# Patient Record
Sex: Male | Born: 1960 | Race: White | Hispanic: No | Marital: Single | Smoking: Never smoker
Health system: Southern US, Community
[De-identification: ages and names within clinical notes are randomized; demographics above are authoritative.]

## PROBLEM LIST (undated history)

## (undated) DIAGNOSIS — S069XAA Unspecified intracranial injury with loss of consciousness status unknown, initial encounter: Secondary | ICD-10-CM

## (undated) DIAGNOSIS — F209 Schizophrenia, unspecified: Secondary | ICD-10-CM

## (undated) DIAGNOSIS — I639 Cerebral infarction, unspecified: Secondary | ICD-10-CM

## (undated) DIAGNOSIS — I219 Acute myocardial infarction, unspecified: Secondary | ICD-10-CM

## (undated) HISTORY — PX: NO PAST SURGERIES: SHX2092

---

## 2021-07-20 ENCOUNTER — Inpatient Hospital Stay
Admission: EM | Admit: 2021-07-20 | Discharge: 2022-03-19 | DRG: 280 | Disposition: A | Discharge status: Home / Self Care | Payer: Medicare HMO | Attending: Internal Medicine | Admitting: Internal Medicine

## 2021-07-20 ENCOUNTER — Other Ambulatory Visit: Payer: Self-pay

## 2021-07-20 DIAGNOSIS — R3915 Urgency of urination: Secondary | ICD-10-CM | POA: Diagnosis present

## 2021-07-20 DIAGNOSIS — G9389 Other specified disorders of brain: Secondary | ICD-10-CM | POA: Diagnosis present

## 2021-07-20 DIAGNOSIS — S069XAS Unspecified intracranial injury with loss of consciousness status unknown, sequela: Secondary | ICD-10-CM

## 2021-07-20 DIAGNOSIS — F209 Schizophrenia, unspecified: Secondary | ICD-10-CM | POA: Diagnosis not present

## 2021-07-20 DIAGNOSIS — R4689 Other symptoms and signs involving appearance and behavior: Principal | ICD-10-CM

## 2021-07-20 DIAGNOSIS — R509 Fever, unspecified: Secondary | ICD-10-CM | POA: Diagnosis not present

## 2021-07-20 DIAGNOSIS — F09 Unspecified mental disorder due to known physiological condition: Secondary | ICD-10-CM

## 2021-07-20 DIAGNOSIS — J690 Pneumonitis due to inhalation of food and vomit: Secondary | ICD-10-CM | POA: Diagnosis present

## 2021-07-20 DIAGNOSIS — I69320 Aphasia following cerebral infarction: Secondary | ICD-10-CM

## 2021-07-20 DIAGNOSIS — W19XXXA Unspecified fall, initial encounter: Secondary | ICD-10-CM

## 2021-07-20 DIAGNOSIS — F259 Schizoaffective disorder, unspecified: Secondary | ICD-10-CM | POA: Diagnosis present

## 2021-07-20 DIAGNOSIS — E860 Dehydration: Secondary | ICD-10-CM | POA: Diagnosis present

## 2021-07-20 DIAGNOSIS — I214 Non-ST elevation (NSTEMI) myocardial infarction: Principal | ICD-10-CM | POA: Diagnosis present

## 2021-07-20 DIAGNOSIS — Z66 Do not resuscitate: Secondary | ICD-10-CM | POA: Diagnosis present

## 2021-07-20 DIAGNOSIS — Z20822 Contact with and (suspected) exposure to covid-19: Secondary | ICD-10-CM | POA: Diagnosis present

## 2021-07-20 DIAGNOSIS — S069XAA Unspecified intracranial injury with loss of consciousness status unknown, initial encounter: Secondary | ICD-10-CM | POA: Diagnosis present

## 2021-07-20 DIAGNOSIS — K5900 Constipation, unspecified: Secondary | ICD-10-CM | POA: Diagnosis present

## 2021-07-20 DIAGNOSIS — Z91199 Patient's noncompliance with other medical treatment and regimen due to unspecified reason: Secondary | ICD-10-CM

## 2021-07-20 DIAGNOSIS — I639 Cerebral infarction, unspecified: Secondary | ICD-10-CM

## 2021-07-20 DIAGNOSIS — Z87828 Personal history of other (healed) physical injury and trauma: Secondary | ICD-10-CM

## 2021-07-20 DIAGNOSIS — Z87820 Personal history of traumatic brain injury: Secondary | ICD-10-CM

## 2021-07-20 DIAGNOSIS — E512 Wernicke's encephalopathy: Secondary | ICD-10-CM | POA: Diagnosis present

## 2021-07-20 DIAGNOSIS — I5043 Acute on chronic combined systolic (congestive) and diastolic (congestive) heart failure: Secondary | ICD-10-CM | POA: Diagnosis present

## 2021-07-20 DIAGNOSIS — N390 Urinary tract infection, site not specified: Secondary | ICD-10-CM | POA: Diagnosis present

## 2021-07-20 DIAGNOSIS — N50819 Testicular pain, unspecified: Secondary | ICD-10-CM

## 2021-07-20 DIAGNOSIS — R32 Unspecified urinary incontinence: Secondary | ICD-10-CM | POA: Diagnosis present

## 2021-07-20 DIAGNOSIS — I2510 Atherosclerotic heart disease of native coronary artery without angina pectoris: Secondary | ICD-10-CM | POA: Diagnosis present

## 2021-07-20 DIAGNOSIS — Z885 Allergy status to narcotic agent status: Secondary | ICD-10-CM

## 2021-07-20 DIAGNOSIS — Z751 Person awaiting admission to adequate facility elsewhere: Secondary | ICD-10-CM

## 2021-07-20 DIAGNOSIS — G9341 Metabolic encephalopathy: Secondary | ICD-10-CM | POA: Diagnosis present

## 2021-07-20 DIAGNOSIS — R748 Abnormal levels of other serum enzymes: Secondary | ICD-10-CM | POA: Clinically undetermined

## 2021-07-20 DIAGNOSIS — I959 Hypotension, unspecified: Secondary | ICD-10-CM | POA: Diagnosis present

## 2021-07-20 DIAGNOSIS — I219 Acute myocardial infarction, unspecified: Secondary | ICD-10-CM

## 2021-07-20 DIAGNOSIS — Z79899 Other long term (current) drug therapy: Secondary | ICD-10-CM

## 2021-07-20 DIAGNOSIS — Z886 Allergy status to analgesic agent status: Secondary | ICD-10-CM

## 2021-07-20 DIAGNOSIS — Z91148 Patient's other noncompliance with medication regimen for other reason: Secondary | ICD-10-CM

## 2021-07-20 DIAGNOSIS — J810 Acute pulmonary edema: Secondary | ICD-10-CM

## 2021-07-20 HISTORY — DX: Acute myocardial infarction, unspecified: I21.9

## 2021-07-20 HISTORY — DX: Schizophrenia, unspecified: F20.9

## 2021-07-20 HISTORY — DX: Unspecified intracranial injury with loss of consciousness status unknown, initial encounter: S06.9XAA

## 2021-07-20 HISTORY — DX: Cerebral infarction, unspecified: I63.9

## 2021-07-20 LAB — COMPREHENSIVE METABOLIC PANEL
ALT: 16 U/L (ref 0–44)
AST: 16 U/L (ref 15–41)
Albumin: 4.3 g/dL (ref 3.5–5.0)
Alkaline Phosphatase: 92 U/L (ref 38–126)
Anion gap: 10 (ref 5–15)
BUN: 16 mg/dL (ref 8–23)
CO2: 27 mmol/L (ref 22–32)
Calcium: 9.3 mg/dL (ref 8.9–10.3)
Chloride: 102 mmol/L (ref 98–111)
Creatinine, Ser: 1.01 mg/dL (ref 0.61–1.24)
GFR, Estimated: >60 mL/min (ref >60)
Glucose, Bld: 96 mg/dL (ref 70–99)
Potassium: 3.7 mmol/L (ref 3.5–5.1)
Sodium: 139 mmol/L (ref 135–145)
Total Bilirubin: 0.9 mg/dL (ref 0.3–1.2)
Total Protein: 7.3 g/dL (ref 6.5–8.1)

## 2021-07-20 LAB — URINE DRUG SCREEN, QUALITATIVE (ARMC ONLY)
Amphetamines, Ur Screen: NOT DETECTED
Barbiturates, Ur Screen: NOT DETECTED
Benzodiazepine, Ur Scrn: POSITIVE — AB
Cannabinoid 50 Ng, Ur ~~LOC~~: NOT DETECTED
Cocaine Metabolite,Ur ~~LOC~~: NOT DETECTED
MDMA (Ecstasy)Ur Screen: NOT DETECTED
Methadone Scn, Ur: NOT DETECTED
Opiate, Ur Screen: NOT DETECTED
Phencyclidine (PCP) Ur S: NOT DETECTED
Tricyclic, Ur Screen: NOT DETECTED

## 2021-07-20 LAB — RESP PANEL BY RT-PCR (FLU A&B, COVID) ARPGX2
Influenza A by PCR: NEGATIVE
Influenza B by PCR: NEGATIVE
SARS Coronavirus 2 by RT PCR: NEGATIVE

## 2021-07-20 LAB — CBC WITH DIFFERENTIAL/PLATELET
Abs Immature Granulocytes: 0.01 10*3/uL (ref 0.00–0.07)
Basophils Absolute: 0 10*3/uL (ref 0.0–0.1)
Basophils Relative: 1 %
Eosinophils Absolute: 0.1 10*3/uL (ref 0.0–0.5)
Eosinophils Relative: 3 %
HCT: 40.6 % (ref 39.0–52.0)
Hemoglobin: 15 g/dL (ref 13.0–17.0)
Immature Granulocytes: 0 %
Lymphocytes Relative: 34 %
Lymphs Abs: 1.8 10*3/uL (ref 0.7–4.0)
MCH: 33.3 pg (ref 26.0–34.0)
MCHC: 36.9 g/dL — ABNORMAL HIGH (ref 30.0–36.0)
MCV: 90.2 fL (ref 80.0–100.0)
Monocytes Absolute: 0.3 10*3/uL (ref 0.1–1.0)
Monocytes Relative: 6 %
Neutro Abs: 2.9 10*3/uL (ref 1.7–7.7)
Neutrophils Relative %: 56 %
Platelets: 166 10*3/uL (ref 150–400)
RBC: 4.5 MIL/uL (ref 4.22–5.81)
RDW: 12.4 % (ref 11.5–15.5)
WBC: 5.1 10*3/uL (ref 4.0–10.5)
nRBC: 0 % (ref 0.0–0.2)

## 2021-07-20 LAB — ETHANOL: Alcohol, Ethyl (B): <10 mg/dL (ref <10)

## 2021-07-20 LAB — ACETAMINOPHEN LEVEL: Acetaminophen (Tylenol), Serum: <10 ug/mL — ABNORMAL LOW (ref 10–30)

## 2021-07-20 LAB — SALICYLATE LEVEL: Salicylate Lvl: <7 mg/dL — ABNORMAL LOW (ref 7.0–30.0)

## 2021-07-20 MED ORDER — DIPHENHYDRAMINE HCL 50 MG/ML IJ SOLN: 25.0000 mg | Freq: Once | INTRAMUSCULAR | Status: AC

## 2021-07-20 MED ORDER — LORAZEPAM 2 MG/ML IJ SOLN: 2.0000 mg | Freq: Once | INTRAMUSCULAR | Status: AC

## 2021-07-20 MED ORDER — DIPHENHYDRAMINE HCL 50 MG/ML IJ SOLN
INTRAMUSCULAR | Status: AC
  Administered 2021-07-20: 25 mg via INTRAMUSCULAR
  Filled 2021-07-20: qty 1

## 2021-07-20 MED ORDER — HALOPERIDOL LACTATE 5 MG/ML IJ SOLN
INTRAMUSCULAR | Status: AC
  Administered 2021-07-20: 5 mg via INTRAMUSCULAR
  Filled 2021-07-20: qty 1

## 2021-07-20 MED ORDER — HALOPERIDOL LACTATE 5 MG/ML IJ SOLN: 5.0000 mg | Freq: Once | INTRAMUSCULAR | Status: AC

## 2021-07-20 MED ORDER — ZIPRASIDONE MESYLATE 20 MG IM SOLR
10.0000 mg | Freq: Once | INTRAMUSCULAR | Status: AC
  Administered 2021-07-20: 10 mg via INTRAMUSCULAR
  Filled 2021-07-20: qty 20

## 2021-07-20 MED ORDER — LORAZEPAM 2 MG/ML IJ SOLN
INTRAMUSCULAR | Status: AC
  Administered 2021-07-20: 2 mg via INTRAMUSCULAR
  Filled 2021-07-20: qty 1

## 2021-07-20 NOTE — ED Notes (Signed)
Dinner tray given. No other needs found.  

## 2021-07-20 NOTE — ED Notes (Signed)
Patient screaming out and singing in room. EDP oked Clinical research associate to give 10mg  Geodon that was ordered earlier and was not given.

## 2021-07-20 NOTE — ED Notes (Addendum)
Pt attempted to walk out of ED- security stopped pt and escorted him back to room with [name] TTS- pt continued yelling and trying to leave- security called for back- security remains at doorAsher Muir, NP aware of situation and gave VO for 10 Geodon after 1 hour if pt still agitated- see MAR for order

## 2021-07-20 NOTE — ED Notes (Signed)
Pt still refusing to dress out

## 2021-07-20 NOTE — ED Notes (Signed)
Pt refusing to give name or allow any staff to touch him- pt yelling loudly- Dr Katrinka Blazing went to see pt- see orders

## 2021-07-20 NOTE — ED Notes (Signed)
IVC, pending MD examination
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
## 2021-07-20 NOTE — ED Notes (Signed)
Pt to doorway holding out ID band- asked pt if he would like me to put it back on his wrist and he agreed- taped armband back on and pt smiled and went back to room

## 2021-07-20 NOTE — ED Notes (Signed)

## 2021-07-20 NOTE — ED Triage Notes (Addendum)
Pt brought in by [employer].  Pt yelling out and uncooperative.  Pt is from a group home and is not taking medications, aggressive behavior.   Pt alert.  Hx schizophrenia  Unable to take vital signs in triage.
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
## 2021-07-21 DIAGNOSIS — F209 Schizophrenia, unspecified: Secondary | ICD-10-CM | POA: Diagnosis not present

## 2021-07-21 MED ORDER — HALOPERIDOL 5 MG PO TABS
5.0000 mg | ORAL_TABLET | Freq: Three times a day (TID) | ORAL | Status: DC
  Administered 2021-07-21: 5 mg via ORAL
  Filled 2021-07-21: qty 1

## 2021-07-21 MED ORDER — LORAZEPAM 2 MG/ML IJ SOLN: 2.0000 mg | Freq: Once | INTRAMUSCULAR | Status: AC

## 2021-07-21 MED ORDER — BENZTROPINE MESYLATE 1 MG PO TABS: 1.0000 mg | ORAL_TABLET | Freq: Every day | ORAL | Status: DC

## 2021-07-21 MED ORDER — DIPHENHYDRAMINE HCL 50 MG/ML IJ SOLN
INTRAMUSCULAR | Status: AC
  Administered 2021-07-21: 50 mg via INTRAMUSCULAR
  Filled 2021-07-21: qty 1

## 2021-07-21 MED ORDER — FAMOTIDINE 20 MG PO TABS
20.0000 mg | ORAL_TABLET | Freq: Two times a day (BID) | ORAL | Status: DC | PRN
  Administered 2021-08-01 – 2021-11-15 (×10): 20 mg via ORAL
  Filled 2021-07-21 (×12): qty 1

## 2021-07-21 MED ORDER — HALOPERIDOL LACTATE 5 MG/ML IJ SOLN
5.0000 mg | Freq: Once | INTRAMUSCULAR | Status: AC
  Administered 2021-07-21: 5 mg via INTRAMUSCULAR
  Filled 2021-07-21: qty 1

## 2021-07-21 MED ORDER — LORAZEPAM 2 MG/ML IJ SOLN
2.0000 mg | INTRAMUSCULAR | Status: AC
  Administered 2021-07-21: 2 mg via INTRAMUSCULAR
  Filled 2021-07-21: qty 1

## 2021-07-21 MED ORDER — DIPHENHYDRAMINE HCL 50 MG/ML IJ SOLN
50.0000 mg | Freq: Once | INTRAMUSCULAR | Status: AC
  Administered 2021-07-21: 50 mg via INTRAMUSCULAR

## 2021-07-21 MED ORDER — FOLIC ACID 1 MG PO TABS
1.0000 mg | ORAL_TABLET | Freq: Every day | ORAL | Status: DC
  Administered 2021-07-23 – 2021-11-19 (×108): 1 mg via ORAL
  Filled 2021-07-21 (×117): qty 1

## 2021-07-21 MED ORDER — THIAMINE HCL 100 MG PO TABS
100.0000 mg | ORAL_TABLET | Freq: Every day | ORAL | Status: DC
  Administered 2021-07-23 – 2021-11-25 (×118): 100 mg via ORAL
  Filled 2021-07-21 (×124): qty 1

## 2021-07-21 MED ORDER — LORAZEPAM 1 MG PO TABS
1.0000 mg | ORAL_TABLET | Freq: Three times a day (TID) | ORAL | Status: DC
  Administered 2021-07-21 – 2021-11-26 (×342): 1 mg via ORAL
  Filled 2021-07-21 (×364): qty 1

## 2021-07-21 MED ORDER — LORAZEPAM 2 MG/ML IJ SOLN
INTRAMUSCULAR | Status: AC
  Administered 2021-07-21: 2 mg via INTRAMUSCULAR
  Filled 2021-07-21: qty 1

## 2021-07-21 MED ORDER — HALOPERIDOL LACTATE 5 MG/ML IJ SOLN: 5.0000 mg | Freq: Once | INTRAMUSCULAR | Status: AC

## 2021-07-21 MED ORDER — TRAZODONE HCL 50 MG PO TABS
150.0000 mg | ORAL_TABLET | Freq: Every day | ORAL | Status: DC
  Administered 2021-07-23 – 2021-07-24 (×2): 150 mg via ORAL
  Filled 2021-07-21 (×3): qty 3

## 2021-07-21 MED ORDER — DIPHENHYDRAMINE HCL 50 MG/ML IJ SOLN: 50.0000 mg | Freq: Once | INTRAMUSCULAR | Status: AC

## 2021-07-21 MED ORDER — ADULT MULTIVITAMIN W/MINERALS CH
1.0000 | ORAL_TABLET | Freq: Every day | ORAL | Status: DC
  Administered 2021-07-23 – 2022-03-18 (×213): 1 via ORAL
  Filled 2021-07-21 (×231): qty 1

## 2021-07-21 MED ORDER — ZIPRASIDONE MESYLATE 20 MG IM SOLR
10.0000 mg | Freq: Once | INTRAMUSCULAR | Status: AC
  Administered 2021-07-21: 10 mg via INTRAMUSCULAR
  Filled 2021-07-21: qty 20

## 2021-07-21 MED ORDER — VITAMIN D 25 MCG (1000 UNIT) PO TABS
1000.0000 [IU] | ORAL_TABLET | Freq: Every day | ORAL | Status: DC
  Administered 2021-07-23 – 2021-11-20 (×111): 1000 [IU] via ORAL
  Filled 2021-07-21 (×120): qty 1

## 2021-07-21 MED ORDER — HALOPERIDOL LACTATE 5 MG/ML IJ SOLN
INTRAMUSCULAR | Status: AC
  Administered 2021-07-21: 5 mg via INTRAMUSCULAR
  Filled 2021-07-21: qty 1

## 2021-07-21 MED ORDER — DIPHENHYDRAMINE HCL 50 MG/ML IJ SOLN
50.0000 mg | Freq: Once | INTRAMUSCULAR | Status: DC
  Filled 2021-07-21: qty 1

## 2021-07-21 MED ORDER — GABAPENTIN 300 MG PO CAPS
300.0000 mg | ORAL_CAPSULE | Freq: Three times a day (TID) | ORAL | Status: DC
  Administered 2021-07-21 – 2021-08-26 (×92): 300 mg via ORAL
  Filled 2021-07-21 (×99): qty 1

## 2021-07-21 NOTE — ED Notes (Signed)
Pt attempted to leave from area when rn entered with medications. Pt assisted back to area with security and this rn physically moving pt back to room. Pt took medications, but then spit trazodone back into cup with water. Pt refuses trazodone.

## 2021-07-21 NOTE — ED Notes (Signed)
Patient was observed beating on the light switch and beating on the door frame. Security turned off his light and patient was content.

## 2021-07-21 NOTE — ED Notes (Signed)
Oriented patient to unit, pt refused vital signs, refused medications. Pt yelling "no, no, no, no".

## 2021-07-21 NOTE — BH Assessment (Signed)
Referral information for Geropsych faxed to:  [facility] ([phone]- [phone]),   [facility] [phone]) [phone]  Earlene Plater [phone]),  [place] [phone]),   [place] [phone] or [phone]),   [facility] [phone])

## 2021-07-21 NOTE — ED Notes (Signed)
Pt ambulatory around area of bhu 6-8. Pt attempting to exit doors in area by pushing on doors and safety glass without success. Pt then intermittently ambulatory back to room and to lie on pt's bed.
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
## 2021-07-21 NOTE — ED Notes (Addendum)
Patient continuously yelling and hitting tv in room, unable to be redirected verbally. IM medications administered as ordered. Security held patient for approximately 15 seconds to prevent patient from flinching during administration and to protect staff. Patient immediately let go. Patients tv turned on per request. Security remains at bedside at this time. Pt ambulatory with full range of motion after IM administration.

## 2021-07-21 NOTE — ED Notes (Addendum)
Pt refused dinner tray. Banging on [facility] door, yelling, pacing. Pt walks toward exit door of the area; verbally stopped by security. Pt continues to yell and bang on doors. Psych NP notified.

## 2021-07-21 NOTE — ED Notes (Signed)
Pt does not answer questions reliably. Asked pt if he knew where he was and he answered "yes" but then could not elaborate on name of place, pt then repeats questions RN is asking back to her without answering. Pt will not answer suicidal questions. Pt accepts meal tray and po fluids. Pt eating at this time.

## 2021-07-21 NOTE — ED Notes (Signed)
Patient becoming increasingly agitated coming into hall, yelling '[place]" and hitting walls. Unable to be verbally redirected at this time. MD [place] aware.

## 2021-07-21 NOTE — ED Provider Notes (Signed)
Emergency Medicine Observation Re-evaluation Note  Adam [name] is a 60 y.o. male, seen on rounds today.  Pt initially presented to the ED for complaints of Psychiatric Evaluation Currently, the patient is resting, voices no medical complaints.  Physical Exam  BP (!) 132/94 (BP Location: Right Arm)   Pulse 88   Temp 98.3 F (36.8 C) (Oral)   Resp 18   Ht 5\' 8"  (1.727 m)   Wt 79.4 kg   SpO2 98%   BMI 26.61 kg/m  Physical Exam General: Resting in no acute distress Cardiac: No cyanosis Lungs: Equal rise and fall Psych: Not agitated  ED Course / MDM  EKG:   I have reviewed the labs performed to date as well as medications administered while in observation.  Recent changes in the last 24 hours include no events overnight.  Plan  Current plan is for psychiatric disposition. Adam [name] is under involuntary commitment.      [name], MD 07/21/21 [phone]
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
## 2021-07-21 NOTE — ED Notes (Signed)
Report from ally, rn.  

## 2021-07-21 NOTE — ED Notes (Signed)
IVC pending inpatient 

## 2021-07-21 NOTE — ED Notes (Signed)
Patient refused vsigns 

## 2021-07-21 NOTE — ED Notes (Signed)
Pt yelling repeatedly about social security check. Pt redirected to lower voice. Pt informed he will need to take his medications tonight. Pt initially said "no" but then was offered choice of medications to calm him down by mouth or shot. Pt chose PO medications. Pt offered nightime snack.
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
## 2021-07-21 NOTE — ED Notes (Signed)
Pt in restroom, this rn and security in to check on pt. Pt sitting on floor picking at toenails. resps unlabored.

## 2021-07-21 NOTE — BH Assessment (Signed)
Writer attempted to contact IVC petitioner Delon Manson Passey of [employer] [phone] however there was no answer/ability to leave voicemail. Follow up needed.

## 2021-07-21 NOTE — ED Notes (Signed)
Patient became agitated, came out in the hallway and refused to go back. [employer] had to walked him back to the room which he resisted. He is staying in the room right now. Dr. [name] is aware.

## 2021-07-21 NOTE — ED Notes (Signed)
Pt banging on door, pacing around.
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
## 2021-07-21 NOTE — ED Notes (Signed)
Patient dressed out by this Clinical research associate and ED Theatre stage manager.

## 2021-07-21 NOTE — ED Notes (Signed)
Pt back into bathroom.

## 2021-07-21 NOTE — ED Notes (Signed)
Pt continues to refuse VS at this time.

## 2021-07-21 NOTE — BH Assessment (Addendum)
Comprehensive Clinical Assessment (CCA) Note  07/21/2021 Adam [name] [id number] Recommendations for Services/Supports/Treatments: Psych NP Madaline Brilliant. determined pt. meets psychiatric inpatient criteria. Facilities will be contacted for placement.    Pt lying in bed, asleep upon this writer's arrival. Pt had fair eye contact and made a point to sit up in bed for the assessment.  Pt was noted to be have garbled speech and would repeat certain words throughout the interview. Motor behavior was restless. Pt was not oriented and appeared disorganized. Pt had an unremarkable appearance. Pt's mood was anxious and his affect was inappropriate. Pt has a known history of schizophrenia and lives in the group home according to the notes. Pt was confused and unaware of the context of the situation, refusing to answer many questions. Pt was able to deny SI.   Collateral: Writer attempted to contact pt's group home however there is no contact information in the pt's chart. TTS to follow up.   Chief Complaint:  Chief Complaint  Patient presents with   Psychiatric Evaluation   Visit Diagnosis: Schizophrenia    CCA Screening, Triage and Referral (STR)  Patient Reported Information How did you hear about [place]? Legal System  Referral name: No data recorded Referral phone number: No data recorded  Whom do you see for routine medical problems? No data recorded Practice/Facility Name: No data recorded Practice/Facility Phone Number: No data recorded Name of Contact: No data recorded Contact Number: No data recorded Contact Fax Number: No data recorded Prescriber Name: No data recorded Prescriber Address (if known): No data recorded  What Is the Reason for Your Visit/Call Today? Increased aggression; psych eval  How Long Has This Been Causing You Problems? <Week  What Do You Feel Would Help You the Most Today? -- (Pt unable to identify needs.)   Have You Recently Been in Any Inpatient Treatment  (Hospital/Detox/Crisis Center/28-Day Program)? No data recorded Name/Location of Program/Hospital:No data recorded How Long Were You There? No data recorded When Were You Discharged? No data recorded  Have You Ever Received Services From [facility] Before? No data recorded Who Do You See at [facility]? No data recorded  Have You Recently Had Any Thoughts About Hurting Yourself? No  Are You Planning to Commit Suicide/Harm Yourself At This time? No   Have you Recently Had Thoughts About Hurting Someone Adam [name]? Yes  Explanation: No data recorded  Have You Used Any Alcohol or Drugs in the Past 24 Hours? No  How Long Ago Did You Use Drugs or Alcohol? No data recorded What Did You Use and How Much? No data recorded  Do You Currently Have a Therapist/Psychiatrist? No  Name of Therapist/Psychiatrist: No data recorded  Have You Been Recently Discharged From Any Office Practice or Programs? No  Explanation of Discharge From Practice/Program: No data recorded    CCA Screening Triage Referral Assessment Type of Contact: Face-to-Face  Is this Initial or Reassessment? No data recorded Date Telepsych consult ordered in [facility]:  No data recorded Time Telepsych consult ordered in [facility]:  No data recorded  Patient Reported Information Reviewed? No data recorded Patient Left Without Being Seen? No data recorded Reason for Not Completing Assessment: No data recorded  Collateral Involvement: None reported   Does Patient Have a Court Appointed Legal Guardian? No data recorded Name and Contact of Legal Guardian: No data recorded If Minor and Not Living with Parent(s), Who has Custody? No data recorded Is CPS involved or ever been involved? Never  Is APS involved or  ever been involved? Never   Patient Determined To Be At Risk for Harm To Self or Others Based on Review of Patient Reported Information or Presenting Complaint? Yes, for Harm to Others  Method: No Plan  Availability of Means:  No access or NA  Intent: Vague intent or NA  Notification Required: No need or identified person  Additional Information for Danger to Others Potential: No data recorded Additional Comments for Danger to Others Potential: No data recorded Are There Guns or Other Weapons in Your Home? No  Types of Guns/Weapons: No data recorded Are These Weapons Safely Secured?                            No data recorded Who Could Verify You Are Able To Have These Secured: No data recorded Do You Have any Outstanding Charges, Pending Court Dates, Parole/Probation? No  Contacted To Inform of Risk of Harm To Self or Others: No data recorded  Location of Assessment: [facility] ED   Does Patient Present under Involuntary Commitment? Yes  IVC Papers Initial File Date: 07/20/21   [place] of Residence: [place]   Patient Currently Receiving the Following Services: Group Home; Medication Management   Determination of Need: Emergent (2 hours)   Options For Referral: Inpatient Hospitalization     CCA Biopsychosocial Intake/Chief Complaint:  No data recorded Current Symptoms/Problems: No data recorded  Patient Reported Schizophrenia/Schizoaffective Diagnosis in Past: Yes   Strengths: No data recorded Preferences: No data recorded Abilities: No data recorded  Type of Services Patient Feels are Needed: No data recorded  Initial Clinical Notes/Concerns: No data recorded  Mental Health Symptoms Depression:  No data recorded  Duration of Depressive symptoms: No data recorded  Mania:  No data recorded  Anxiety:   No data recorded  Psychosis:  No data recorded  Duration of Psychotic symptoms:  Greater than six months   Trauma:   N/A   Obsessions:   N/A   Compulsions:   N/A   Inattention:   N/A   Hyperactivity/Impulsivity:   None   Oppositional/Defiant Behaviors:   None   Emotional Irregularity:   Mood lability   Other Mood/Personality Symptoms:  No data recorded   Mental  Status Exam Appearance and self-care  Stature:   Average   Weight:   Average weight   Clothing:   Casual   Grooming:   Normal   Cosmetic use:   None   Posture/gait:   Normal   Motor activity:   Not Remarkable   Sensorium  Attention:   Unaware   Concentration:   Focuses on irrelevancies   Orientation:   -- (Pt not oriented)   Recall/memory:   -- (UTA)   Affect and Mood  Affect:   Inappropriate   Mood:   Other (Comment) (UTA)   Relating  Eye contact:   Fleeting   Facial expression:   Constricted   Attitude toward examiner:   Silly   Thought and Language  Speech flow:  Garbled   Thought content:   Appropriate to Mood and Circumstances   Preoccupation:   None   Hallucinations:   Other (Comment) (UTA)   Organization:  No data recorded  [employer] of Knowledge:   Fair   Intelligence:   Needs investigation   Abstraction:   Concrete   Judgement:   Poor   Reality Testing:   Unaware   Insight:   Poor   Decision Making:  Confused   Social Functioning  Social Maturity:   Impulsive   Social Judgement:   Heedless   Stress  Stressors:   Illness   Coping Ability:   Overwhelmed   Skill Deficits:   Communication; Interpersonal; Self-control   Supports:   Friends/Service system     Religion: Religion/Spirituality Are You A Religious Person?:  (UTA) How Might This Affect Treatment?: N/A  Leisure/Recreation: Leisure / Recreation Do You Have Hobbies?:  (UTA)  Exercise/Diet: Exercise/Diet Do You Exercise?:  (UTA) Have You Gained or Lost A Significant Amount of Weight in the Past Six Months?:  (UTA) Do You Follow a Special Diet?:  (UTA) Do You Have Any Trouble Sleeping?:  (UTA)   CCA Employment/Education Employment/Work Situation: Employment / Work Situation Employment Situation: On disability Why is Patient on Disability: Mental health dx of schizophrenia How Long has Patient Been on  Disability:  (UTA) Patient's Job has Been Impacted by Current Illness: No Has Patient ever Been in the [employer]?: No  Education: Education Is Patient Currently Attending School?: No   CCA Family/Childhood History Family and Relationship History: Family history Marital status: Single Does patient have children?: No  Childhood History:  Childhood History By whom was/is the patient raised?:  (UTA) Did patient suffer any verbal/emotional/physical/sexual abuse as a child?:  (UTA) Did patient suffer from severe childhood neglect?:  (UTA) Has patient ever been sexually abused/assaulted/raped as an adolescent or adult?:  (UTA) Was the patient ever a victim of a crime or a disaster?:  (UTA) Witnessed domestic violence?:  (UTA) Has patient been affected by domestic violence as an adult?:  Industrial/product designer)  Child/Adolescent Assessment:     CCA Substance Use Alcohol/Drug Use: Alcohol / Drug Use Pain Medications: See PTA Prescriptions: See PTA Over the Counter: See PTA History of alcohol / drug use?: No history of alcohol / drug abuse                         ASAM's:  Six Dimensions of Multidimensional Assessment  Dimension 1:  Acute Intoxication and/or Withdrawal Potential:      Dimension 2:  Biomedical Conditions and Complications:      Dimension 3:  Emotional, Behavioral, or Cognitive Conditions and Complications:     Dimension 4:  Readiness to Change:     Dimension 5:  Relapse, Continued use, or Continued Problem Potential:     Dimension 6:  Recovery/Living Environment:     ASAM Severity Score:    ASAM Recommended Level of Treatment:     Substance use Disorder (SUD)    Recommendations for Services/Supports/Treatments:    DSM5 Diagnoses: Patient Active Problem List   Diagnosis Date Noted   Schizophrenia (HCC) 07/20/2021   Ching Rabideau [employer], LCAS

## 2021-07-21 NOTE — ED Notes (Signed)
Refused dinner and vital signs.

## 2021-07-21 NOTE — ED Notes (Signed)
Report given to [facility]. Several Tax adviser are in the [place].

## 2021-07-21 NOTE — ED Notes (Signed)
Pt out of restroom, to bed, lying on bed.

## 2021-07-22 ENCOUNTER — Emergency Department: Payer: Medicare HMO

## 2021-07-22 DIAGNOSIS — F209 Schizophrenia, unspecified: Secondary | ICD-10-CM | POA: Diagnosis not present

## 2021-07-22 MED ORDER — BENZTROPINE MESYLATE 1 MG PO TABS
1.0000 mg | ORAL_TABLET | Freq: Two times a day (BID) | ORAL | Status: DC
  Administered 2021-07-23 – 2021-08-26 (×64): 1 mg via ORAL
  Filled 2021-07-22 (×70): qty 1

## 2021-07-22 MED ORDER — LORAZEPAM 2 MG/ML IJ SOLN
2.0000 mg | Freq: Once | INTRAMUSCULAR | Status: AC
  Administered 2021-07-22: 2 mg via INTRAMUSCULAR
  Filled 2021-07-22: qty 1

## 2021-07-22 MED ORDER — LORAZEPAM 2 MG/ML IJ SOLN
INTRAMUSCULAR | Status: AC
  Administered 2021-07-22: 2 mg via INTRAMUSCULAR
  Filled 2021-07-22: qty 1

## 2021-07-22 MED ORDER — ZIPRASIDONE MESYLATE 20 MG IM SOLR
20.0000 mg | Freq: Once | INTRAMUSCULAR | Status: AC
  Administered 2021-07-22: 20 mg via INTRAMUSCULAR

## 2021-07-22 MED ORDER — CHLORPROMAZINE HCL 25 MG/ML IJ SOLN
50.0000 mg | Freq: Once | INTRAMUSCULAR | Status: AC
  Administered 2021-07-22: 50 mg via INTRAMUSCULAR
  Filled 2021-07-22: qty 2

## 2021-07-22 MED ORDER — LORAZEPAM 2 MG/ML IJ SOLN: 2.0000 mg | Freq: Once | INTRAMUSCULAR | Status: AC

## 2021-07-22 MED ORDER — DIPHENHYDRAMINE HCL 50 MG/ML IJ SOLN: 50.0000 mg | Freq: Once | INTRAMUSCULAR | Status: AC

## 2021-07-22 MED ORDER — DIPHENHYDRAMINE HCL 50 MG/ML IJ SOLN
INTRAMUSCULAR | Status: AC
  Administered 2021-07-22: 50 mg via INTRAMUSCULAR
  Filled 2021-07-22: qty 1

## 2021-07-22 MED ORDER — CHLORPROMAZINE HCL 25 MG/ML IJ SOLN
50.0000 mg | Freq: Once | INTRAMUSCULAR | Status: DC
  Filled 2021-07-22 (×3): qty 2

## 2021-07-22 MED ORDER — HALOPERIDOL 5 MG PO TABS
10.0000 mg | ORAL_TABLET | Freq: Two times a day (BID) | ORAL | Status: DC
  Administered 2021-07-23 – 2021-07-24 (×3): 10 mg via ORAL
  Filled 2021-07-22 (×3): qty 2

## 2021-07-22 MED ORDER — LORAZEPAM 2 MG PO TABS: 2.0000 mg | ORAL_TABLET | Freq: Once | ORAL | Status: DC

## 2021-07-22 NOTE — ED Notes (Signed)
Pt noted to be banging on door to sally port- this RN to day room to request pt to stop and explained that no one was allowed to go back there- pt again went to door to bang on it after this RN came back to RN station- this RN again told pt that he needed to stop banging on door- pt called this RN a bitch and sat back down in the day room

## 2021-07-22 NOTE — ED Notes (Signed)
Pt took oral medications.  Needs frequent verbal redirection to stay in his room.

## 2021-07-22 NOTE — ED Notes (Signed)
Pt back in bed in room bhu 6

## 2021-07-22 NOTE — ED Notes (Signed)
Lunch tray given. 

## 2021-07-22 NOTE — ED Notes (Signed)
Pt attempting to leave and yelling.  Pt unable to communicate his needs. EDP made aware.  IM medication given as ordered.

## 2021-07-22 NOTE — ED Notes (Signed)
Pt banging on back door and yelling- per night shift pt has already placed hole in wall in bathroom

## 2021-07-22 NOTE — ED Notes (Signed)
Pt up, yelling intermittently. Pt offered po fluids and snack but declines. Pt was sitting on floor, but able to get up when RN entered treatment area without assist.

## 2021-07-22 NOTE — ED Notes (Signed)
Pt up intermittently beating on doors and yelling, "people's lives" vs. Lying in bed for approx one minute before getting up again to beat on door and yell.

## 2021-07-22 NOTE — ED Notes (Signed)
Pt continues to come into hall, difficult to redirect.  Banging can be heard coming from patient's room.

## 2021-07-22 NOTE — ED Notes (Signed)
Security sitting with pt in dayroom while back rooms being cleaned so pt can go back to quad area

## 2021-07-22 NOTE — ED Notes (Signed)
Celso Amy, NP let pt into day room in an attempt to deescalate the pt- pt was told that if he starts to act up he had to go back to his room and the door to the day room would be closed again- pt currently sitting in day room

## 2021-07-22 NOTE — ED Notes (Signed)
Pt lying on floor in room 7, moving legs intermittently. Pt placed himself on floor.

## 2021-07-22 NOTE — ED Notes (Signed)
Alyssa, Tech, is at [employer] as a Comptroller. Patient is alert and mobile. Patient is tolerating having a sitter well.

## 2021-07-22 NOTE — ED Notes (Signed)
Pt awake and banging on door again- Celso Amy, NP aware

## 2021-07-22 NOTE — ED Notes (Signed)
Patient came out and asked for phone repeatedly. Patient was given the ED phone and instructions to dial 9 for an outside line. Patient dialed several numbers in a row, more than a phone number. Patient was observed sitting on the bed, laughing while saying random words in the phone. Patient voluntarily gave the phone to this writer and had a smile and voluntarily went back to his room. Patient came back out for the phone and told that it was after hours. Patient went back to his room without any issue.

## 2021-07-22 NOTE — ED Provider Notes (Signed)
Emergency Medicine Observation Re-evaluation Note  Adam [name] is a 60 y.o. male, seen on rounds today.  Pt initially presented to the ED for exacerbation of schizophrenia   Physical Exam  BP 128/87   Pulse 84   Temp 98 F (36.7 C) (Oral)   Resp 14   Ht 1.727 m (5\' 8" )   Wt 79.4 kg   SpO2 96%   BMI 26.61 kg/m  Physical Exam General: No acute events overnight  Lungs: No increased work of breathing Psych: Currently calm,   ED Course / MDM  Patient seen by psychiatry yesterday, plan is for inpatient admission  Plan  Inpatient psychiatric admission Adam [name] is under involuntary commitment.      [name], MD 07/22/21 [phone]
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
## 2021-07-22 NOTE — ED Notes (Signed)
Pt up yelling, banging on doors, not redirectable.

## 2021-07-22 NOTE — ED Notes (Signed)
Report to ariel, rn.  
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
## 2021-07-23 MED ORDER — HALOPERIDOL 5 MG PO TABS
5.0000 mg | ORAL_TABLET | Freq: Once | ORAL | Status: AC
  Administered 2021-07-23: 5 mg via ORAL
  Filled 2021-07-23: qty 1

## 2021-07-23 MED ORDER — HALOPERIDOL LACTATE 5 MG/ML IJ SOLN
5.0000 mg | Freq: Once | INTRAMUSCULAR | Status: AC
  Administered 2021-07-23: 5 mg via INTRAMUSCULAR
  Filled 2021-07-23: qty 1

## 2021-07-23 MED ORDER — ZIPRASIDONE MESYLATE 20 MG IM SOLR
20.0000 mg | Freq: Once | INTRAMUSCULAR | Status: DC
  Filled 2021-07-23: qty 20

## 2021-07-23 MED ORDER — DIPHENHYDRAMINE HCL 50 MG/ML IJ SOLN
25.0000 mg | Freq: Once | INTRAMUSCULAR | Status: AC
  Administered 2021-07-23: 25 mg via INTRAMUSCULAR
  Filled 2021-07-23: qty 1

## 2021-07-23 MED ORDER — LORAZEPAM 2 MG/ML IJ SOLN
1.0000 mg | Freq: Once | INTRAMUSCULAR | Status: AC
  Administered 2021-07-23: 1 mg via INTRAMUSCULAR
  Filled 2021-07-23: qty 1
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
## 2021-07-23 NOTE — ED Notes (Signed)
Breakfast tray given to pt 

## 2021-07-23 NOTE — BH Assessment (Signed)
Patient referred to [facility]   Patient has been reviewed with [facility] Nurse today 07/23/21  Confirmed with [facility] ([phone]), patient is now on their Waitlist as of 07/23/21 7:57pm

## 2021-07-23 NOTE — ED Notes (Signed)
Patient out in hallway trying to walk pass security.  When security tried to redirect patient back to his room, patient kept walking toward, security.  Security escorted patient back to room.

## 2021-07-23 NOTE — ED Notes (Signed)
IVC pending inpatient 

## 2021-07-23 NOTE — BH Assessment (Addendum)
Patient referred to [facility]   Pt. referred to [facility]   Verbal screening completed with [facility]([phone]), information faxed and confirmed it was received.   Patient to be reviewed with [facility] Nurse as of 07/23/21 4:55am

## 2021-07-23 NOTE — ED Provider Notes (Signed)
Emergency Medicine Observation Re-evaluation Note  Adam [name] is a 60 y.o. male, seen on rounds today.  Pt initially presented to the ED for complaints of Psychiatric Evaluation Currently, the patient is resting, voices no medical complaints.  Physical Exam  BP 128/87   Pulse 84   Temp 98 F (36.7 C) (Oral)   Resp 14   Ht 5\' 8"  (1.727 m)   Wt 79.4 kg   SpO2 96%   BMI 26.61 kg/m  Physical Exam General: Resting in no acute distress Cardiac: No cyanosis Lungs: Equal rise and fall Psych: Not agitated  ED Course / MDM  EKG:EKG Interpretation  Date/Time:  Thursday July 20 2021 18:24:24 EDT Ventricular Rate:  71 PR Interval:  168 QRS Duration: 116 QT Interval:  392 QTC Calculation: 425 R Axis:   -6 Text Interpretation: Normal sinus rhythm Incomplete right bundle branch block Borderline ECG ----------unconfirmed---------- Confirmed by UNCONFIRMED, DOCTOR (04-13-1990), editor [id number] [phone]) on 07/21/2021 9:32:41 AM  I have reviewed the labs performed to date as well as medications administered while in observation.  Recent changes in the last 24 hours include patient complained of left hand pain.  X-ray was ordered.  Before I could evaluate patient's hand, he refused x-ray and examination.  He became agitated and was unable to be verbally redirected.  Required IM calming agent.  Received IM Geodon with good effect and remained calm for the rest of the night. Plan  Current plan is for psychiatric disposition. Adam [name] is under involuntary commitment.      [name], MD 07/23/21 [phone]

## 2021-07-23 NOTE — ED Notes (Signed)
Pt is getting very agitated. Pacing back and forth and not making much sense when he speaks. Asked MD for meds to help calm pt down. Will give meds as ordered,

## 2021-07-24 DIAGNOSIS — F209 Schizophrenia, unspecified: Secondary | ICD-10-CM | POA: Diagnosis not present

## 2021-07-24 MED ORDER — HALOPERIDOL 5 MG PO TABS
10.0000 mg | ORAL_TABLET | Freq: Three times a day (TID) | ORAL | Status: DC
  Administered 2021-07-24 – 2021-08-05 (×32): 10 mg via ORAL
  Filled 2021-07-24 (×32): qty 2

## 2021-07-24 NOTE — BH Assessment (Signed)
Writer contacted group home owner, Mr. Adam [name] [phone]). Left message on voicemail to return call.

## 2021-07-24 NOTE — ED Notes (Signed)
Hourly rounding reveals patient in room. No complaints, stable, in no acute distress. Q15 minute rounds and monitoring via Rover and Officer to continue.   

## 2021-07-24 NOTE — ED Provider Notes (Signed)
Emergency Medicine Observation Re-evaluation Note  Adam [name] is a 60 y.o. male, seen on rounds today.  Pt initially presented to the ED for complaints of Psychiatric Evaluation Currently, the patient is sleeping.  Physical Exam  BP (!) 143/83 (BP Location: Left Arm)   Pulse (!) 101   Temp 98.2 F (36.8 C) (Oral)   Resp 16   Ht 5\' 8"  (1.727 m)   Wt 79.4 kg   SpO2 99%   BMI 26.61 kg/m  Physical Exam Gen: No acute distress  Resp: Normal rise and fall of chest Neuro: Moving all four extremities Psych: Resting currently, calm and cooperative when awake    ED Course / MDM  EKG:EKG Interpretation  Date/Time:  Thursday July 20 2021 18:24:24 EDT Ventricular Rate:  71 PR Interval:  168 QRS Duration: 116 QT Interval:  392 QTC Calculation: 425 R Axis:   -6 Text Interpretation: Normal sinus rhythm Incomplete right bundle branch block Borderline ECG ----------unconfirmed---------- Confirmed by UNCONFIRMED, DOCTOR (04-13-1990), editor [phone]) on 07/21/2021 9:32:41 AM  I have reviewed the labs performed to date as well as medications administered while in observation.  Recent changes in the last 24 hours include no acute events overnight.  Plan  Current plan is for inpatient psychiatric treatment. Taksh Hjort is under involuntary commitment.      Deborah Dondero, [name], DO 07/24/21 [phone]

## 2021-07-24 NOTE — ED Notes (Signed)
Snack and beverage given. 
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
## 2021-07-24 NOTE — ED Notes (Signed)
Report to include Situation, Background, Assessment, and Recommendations received from [facility]. Patient alert and oriented, warm and dry, in no acute distress. Patient denies SI, HI, AVH and pain. Patient made aware of Q15 minute rounds and Psychologist, counselling presence for their safety. Patient instructed to come to me with needs or concerns.

## 2021-07-24 NOTE — ED Notes (Signed)
IVC pending placement 

## 2021-07-24 NOTE — BH Assessment (Signed)
Referral checks:   [facility] ([phone]- [phone]), No answer    [facility] [phone]), No answer    [facility] [phone] -or- [phone]), Per Durenda Age, pt is denied due to medical/dementia diagnosis.    [facility] ([phone]) Per Onalee Hua, there is no intake coordinator available until tomorrow.      [facility] [phone] or [phone]) Left a voicemail    [facility] [phone] or [phone]), No answer    [facility] [phone]). Left voicemail

## 2021-07-24 NOTE — BH Assessment (Signed)
Adult MH  Referral information for Psychiatric Hospitalization faxed to:   [facility] ([phone]- [phone]),   [facility] ([phone]),   [facility] ([phone] -or- [phone]),   [facility] [phone] -or- [phone]) [phone]   [facility] ([phone]---[phone]),   [facility] ([phone] or [phone])   [facility] ([phone] or [phone]),   [facility] ([phone]). 

## 2021-07-24 NOTE — BH Assessment (Signed)
Writer called owner of group home, Mr. Calton Dach [phone]. Left message on voicemail to return call.

## 2021-07-25 DIAGNOSIS — F209 Schizophrenia, unspecified: Secondary | ICD-10-CM | POA: Diagnosis not present

## 2021-07-25 DIAGNOSIS — F09 Unspecified mental disorder due to known physiological condition: Secondary | ICD-10-CM

## 2021-07-25 DIAGNOSIS — S069XAS Unspecified intracranial injury with loss of consciousness status unknown, sequela: Secondary | ICD-10-CM

## 2021-07-25 MED ORDER — PALIPERIDONE ER 3 MG PO TB24: 3.0000 mg | ORAL_TABLET | Freq: Every day | ORAL | Status: DC

## 2021-07-25 MED ORDER — TEMAZEPAM 7.5 MG PO CAPS
15.0000 mg | ORAL_CAPSULE | Freq: Every day | ORAL | Status: DC
  Administered 2021-07-25 – 2021-08-06 (×13): 15 mg via ORAL
  Filled 2021-07-25 (×14): qty 2

## 2021-07-25 MED ORDER — PALIPERIDONE ER 3 MG PO TB24
3.0000 mg | ORAL_TABLET | Freq: Every day | ORAL | Status: DC
  Administered 2021-07-25 – 2021-08-07 (×11): 3 mg via ORAL
  Filled 2021-07-25 (×16): qty 1

## 2021-07-25 NOTE — ED Notes (Signed)
Lunch tray was given with juice. 

## 2021-07-25 NOTE — ED Notes (Signed)
Pt given sandwich tray 
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
## 2021-07-25 NOTE — ED Provider Notes (Signed)
Emergency Medicine Observation Re-evaluation Note  Adam [name] is a 60 y.o. male, seen on rounds today.  Pt initially presented to the ED for complaints of Psychiatric Evaluation Currently, the patient is sleeping.  Physical Exam  BP 139/89 (BP Location: Left Arm)   Pulse 86   Temp 99 F (37.2 C) (Oral)   Resp 17   Ht 5\' 8"  (1.727 m)   Wt 79.4 kg   SpO2 99%   BMI 26.61 kg/m  Physical Exam Gen: No acute distress  Resp: Normal rise and fall of chest Neuro: Moving all four extremities Psych: Resting currently, calm and cooperative when awake    ED Course / MDM  EKG:EKG Interpretation  Date/Time:  Thursday July 20 2021 18:24:24 EDT Ventricular Rate:  71 PR Interval:  168 QRS Duration: 116 QT Interval:  392 QTC Calculation: 425 R Axis:   -6 Text Interpretation: Normal sinus rhythm Incomplete right bundle branch block Borderline ECG ----------unconfirmed---------- Confirmed by UNCONFIRMED, DOCTOR (04-13-1990), editor [id number] [phone]) on 07/21/2021 9:32:41 AM  I have reviewed the labs performed to date as well as medications administered while in observation.  Recent changes in the last 24 hours include no acute events overnight.  Plan  Current plan is for inpatient psychiatric placement. Adam [name] is under involuntary commitment.      Chieko Neises, [name], DO 07/25/21 [phone]

## 2021-07-25 NOTE — ED Notes (Signed)
Breakfast given. VS assess. No other needs found at this moment.   ?

## 2021-07-25 NOTE — ED Notes (Signed)
Hourly rounding reveals patient in room. No complaints, stable, in no acute distress. Q15 minute rounds and monitoring via Rover and Officer to continue.   

## 2021-07-25 NOTE — ED Notes (Signed)
Night snack and drink given to pt.  

## 2021-07-25 NOTE — BH Assessment (Signed)
TTS and Dr. Weber Cooks met with patient for reassessment. Patient continues to have limited communication. Patient repeats "[facility]" and will sometimes answer yes or no. Patient is unable to write responses.   Interviewers both spoke with patient's mother/POA, [place] Palladino [phone]. Ms. Rood states patient had a global stroke 6 years ago and patient has not been able to fully communicate since that time. She also mentioned patient had a brain injury when he was 60yr old. She states patient usually becomes agitated and frustrated when he is trying to speak. Patient was calm and cooperative during the assessment.   We will continue to search for placement as it may become difficult given his "aggressive behavior".

## 2021-07-25 NOTE — ED Notes (Signed)
VS not taken, patient asleep 

## 2021-07-25 NOTE — BH Assessment (Signed)
Writer received call from group home owner, Mr. Calton Dach stating patient is not able to return to group home due to his agitation. Mr. Calton Dach states patient does not sleep and yells all through out the night alerting other residents. Mr. Calton Dach would like for the hospital to link patient with a higher level of care.

## 2021-07-26 DIAGNOSIS — F209 Schizophrenia, unspecified: Secondary | ICD-10-CM | POA: Diagnosis not present

## 2021-07-26 NOTE — Consult Note (Signed)
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 male patient admitted with "[place]!".  HPI: Patient seen again today.  Patient is still not able to communicate with language other than a couple of words.  He repeats the name of his previous facility a lot.  Patient does not appear to be in any distress although at times he is somewhat hyperactive and agitated.  Not behaving violently or aggressively.  Medically appears stable.  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  None reported Family History: No family history on file. Family Psychiatric  History: None reported Social History:  Social History   Substance and Sexual Activity  Alcohol Use Not on file     Social History   Substance and Sexual Activity  Drug Use Not on file    Social History   Socioeconomic History   Marital status: Single    Spouse name: Not on file   Number of children: Not on file   Years of education: Not on file   Highest education level: Not on file  Occupational History   Not on file  Tobacco Use   Smoking status: Not on file   Smokeless tobacco: Not on file  Substance  and Sexual Activity   Alcohol use: Not on file   Drug use: Not on file   Sexual activity: Not on file  Other Topics Concern   Not on file  Social History Narrative   Not on file   Social Determinants of Health   Financial Resource Strain: Not on file  Food Insecurity: Not on file  Transportation Needs: Not on file  Physical Activity: Not on file  Stress: Not on file  Social Connections: Not on file   Additional Social History:    Allergies:   Allergies  Allergen Reactions   Morphine And Related    Nsaids     Labs: No results found for this or any previous visit (from the past 48 hour(s)).  Current Facility-Administered Medications  Medication Dose Route Frequency Provider Last Rate Last Admin   benztropine (COGENTIN) tablet 1 mg  1 mg Oral BID Charm Rings, NP   1 mg at 07/26/21 1054   chlorproMAZINE (THORAZINE) injection 50 mg  50 mg Intramuscular Once Charm Rings, NP       cholecalciferol (VITAMIN D3) tablet 1,000 Units  1,000 Units Oral Daily Charm Rings, NP   1,000 Units at 07/26/21 1054   famotidine (PEPCID) tablet 20 mg  20 mg Oral BID PRN Charm Rings, NP       folic acid (FOLVITE) tablet 1 mg  1 mg Oral Daily Charm Rings, NP   1 mg at 07/26/21 1054   gabapentin (NEURONTIN) capsule 300 mg  300 mg Oral TID Charm Rings, NP   300 mg at 07/26/21 1053   haloperidol (HALDOL) tablet 10 mg  10 mg Oral TID Davielle Lingelbach, [name], MD   10 mg at 07/26/21 1054   LORazepam (ATIVAN) tablet 1 mg  1 mg Oral TID Charm Rings, NP   1 mg at 07/26/21 1053   multivitamin with minerals tablet 1 tablet  1 tablet Oral Daily Charm Rings, NP   1 tablet at 07/26/21 1055   paliperidone (INVEGA) 24 hr tablet 3 mg  3 mg Oral Q1400 Alexandro Line T, MD   3 mg at 07/25/21 1421   temazepam (RESTORIL) capsule 15 mg  15 mg Oral QHS Chayna Surratt T, MD   15 mg at 07/25/21 2103   thiamine tablet 100 mg  100 mg Oral Daily Charm Rings, NP   100 mg at 07/26/21 1053   ziprasidone  (GEODON) injection 20 mg  20 mg Intramuscular Once Irean Hong, MD       Current Outpatient Medications  Medication Sig Dispense Refill   benztropine (COGENTIN) 1 MG tablet Take 1 mg by mouth daily.     cholecalciferol (VITAMIN D3) 25 MCG (1000 UNIT) tablet Take 1,000 Units by mouth daily.     famotidine (PEPCID) 20 MG tablet Take 20 mg by mouth 2 (two) times daily as needed for heartburn or indigestion.     folic acid (FOLVITE) 1 MG tablet Take 1 mg by mouth daily.     gabapentin (NEURONTIN) 300 MG capsule Take 300 mg by mouth 3 (three) times daily.     haloperidol (HALDOL) 5 MG tablet Take 5 mg by mouth 3 (three) times daily.     LORazepam (ATIVAN) 1 MG tablet Take 1 mg by mouth 3 (three) times daily.     Multiple Vitamin (MULTIVITAMIN) tablet Take 1 tablet by mouth daily.     paliperidone (INVEGA SUSTENNA) 234 MG/1.5ML SUSY injection Inject 234 mg into the muscle every 28 (twenty-eight) days.     thiamine (VITAMIN B-1) 100 MG tablet Take 100 mg by mouth daily.     traZODone (DESYREL) 150 MG tablet Take 150 mg by mouth at bedtime.      Musculoskeletal: Strength & Muscle Tone: within normal limits Gait & Station: normal Patient leans: N/A            Psychiatric Specialty Exam:  Presentation  General Appearance: Bizarre  Eye Contact:Poor  Speech:Garbled  Speech Volume:Increased  Handedness:Right   Mood and Affect  Mood:Euphoric  Affect:Inappropriate; Congruent   Thought Process  Thought Processes:Disorganized  Descriptions of Associations:Loose  Orientation:Partial  Thought Content:Delusions; Illogical; Tangential  History of Schizophrenia/Schizoaffective disorder:Yes  Duration of Psychotic Symptoms:Greater than six months  Hallucinations:No data recorded Ideas of Reference:Delusions  Suicidal Thoughts:No data recorded Homicidal Thoughts:No data recorded  Sensorium  Memory:Immediate Poor; Recent Poor; Remote  Poor  Judgment:Impaired  Insight:-- (Unable to assess)   Executive Functions  Concentration:Poor  Attention Span:Poor  Recall:Poor  Fund of Knowledge:Poor  Language:Poor   Psychomotor Activity  Psychomotor Activity: No data recorded  Assets  Assets:Housing; Resilience; Social Support; Talents/Skills   Sleep  Sleep: No data recorded  Physical Exam: Physical Exam Vitals and nursing note reviewed.  Constitutional:      Appearance: Normal appearance.  HENT:     Head: Normocephalic and atraumatic.     Mouth/Throat:     Pharynx: Oropharynx is clear.  Eyes:     Pupils: Pupils are equal, round, and  reactive to light.  Cardiovascular:     Rate and Rhythm: Normal rate and regular rhythm.  Pulmonary:     Effort: Pulmonary effort is normal.     Breath sounds: Normal breath sounds.  Abdominal:     General: Abdomen is flat.     Palpations: Abdomen is soft.  Musculoskeletal:        General: Normal range of motion.  Skin:    General: Skin is warm and dry.  Neurological:     General: No focal deficit present.     Mental Status: He is alert. Mental status is at baseline.  Psychiatric:        Attention and Perception: He is inattentive.        Mood and Affect: Affect is inappropriate.        Speech: He is noncommunicative.        Behavior: Behavior is not aggressive.        Thought Content: Thought content does not include homicidal or suicidal ideation.        Cognition and Memory: Cognition is impaired. Memory is impaired.        Judgment: Judgment is inappropriate.   Review of Systems  Unable to perform ROS: Medical condition  Psychiatric/Behavioral:  Negative for depression, substance abuse and suicidal ideas.   Blood pressure 132/71, pulse 79, temperature 98.8 F (37.1 C), temperature source Oral, resp. rate 18, height 5\' 8"  (1.727 m), weight 79.4 kg, SpO2 99 %. Body mass index is 26.61 kg/m.  Treatment Plan Summary: Plan after evaluating this patient and  seeing him for a couple days it appears that based on all the information we have including history from his mother that this patient is at his baseline in terms of mental state.  He is not currently violent or threatening although he is somewhat hyperactive.  No evidence of being suicidal.  No benefit to be gained from psychiatric hospitalization.  I am going to discontinue the patient's IVC at this time and refer him to transition of care to work on possible placement options.  No change to medicine.  Disposition: No evidence of imminent risk to self or others at present.   Supportive therapy provided about ongoing stressors.  , MD 07/26/2021 12:31 PM

## 2021-07-26 NOTE — ED Notes (Signed)
Pt. Alert and oriented x 2, warm and dry, in no distress. Pt. Denies SI, HI, and AVH. Pt. Encouraged to let nursing staff know of any concerns or needs.  ENVIRONMENTAL ASSESSMENT Potentially harmful objects out of patient reach: Yes.   Personal belongings secured: Yes.   Patient dressed in hospital provided attire only: Yes.   Plastic bags out of patient reach: Yes.   Patient care equipment (cords, cables, call bells, lines, and drains) shortened, removed, or accounted for: Yes.   Equipment and supplies removed from bottom of stretcher: Yes.   Potentially toxic materials out of patient reach: Yes.   Sharps container removed or out of patient reach: Yes.

## 2021-07-26 NOTE — ED Notes (Signed)
VOL, pending placement 

## 2021-07-26 NOTE — ED Notes (Signed)
IVC/ Pending INPT Admit

## 2021-07-26 NOTE — ED Provider Notes (Signed)
Emergency Medicine Observation Re-evaluation Note  Adam [name] is a 60 y.o. male, initially presented to the ED for complaints of Psychiatric Evaluation No acute events today.  Patient will occasionally yell out however this is largely his baseline.  Physical Exam  BP 132/71   Pulse 79   Temp 98.8 F (37.1 C) (Oral)   Resp 18   Ht 5\' 8"  (1.727 m)   Wt 79.4 kg   SpO2 99%   BMI 26.61 kg/m    ED Course / MDM   Recent lab work reviewed by myself shows no concerning findings.  No lab work within the past 6 days.  Plan  Current plan is for placement into an appropriate living facility.  Adam [name] is not under involuntary commitment.     [name], MD 07/26/21 [phone]

## 2021-07-26 NOTE — ED Notes (Signed)
Pt. Was given a sandwich tray and a drink. 

## 2021-07-27 DIAGNOSIS — F209 Schizophrenia, unspecified: Secondary | ICD-10-CM | POA: Diagnosis not present

## 2021-07-27 NOTE — BH Assessment (Signed)
Writer contacted [facility] and spoke with (Ms. Jones-[phone]), patient remains on wait list. 

## 2021-07-27 NOTE — ED Notes (Signed)
Unable to obtain vitals due to patient sleeping. Will continue to monitor.   

## 2021-07-27 NOTE — ED Notes (Signed)
VOL  PENDING  PLACEMENT 

## 2021-07-27 NOTE — ED Notes (Signed)
Pt is verbally escalating, has minimal vocabulary and becomes angry when staff is unable to interpret what he wants. Pt now yelling "no" repeatedly, then starts to yell "phone call" continuously, educated again on the rules of phone calls. Dr. Larinda Buttery states night meds can be administered now.
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
## 2021-07-27 NOTE — ED Notes (Signed)
Dr.[name] at bedside  

## 2021-07-27 NOTE — ED Notes (Signed)
Patient threw his breakfast in trash and requesting sprite to drink, drink was given

## 2021-07-27 NOTE — ED Notes (Signed)
Pt given meal tray and phone
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
## 2021-07-27 NOTE — ED Notes (Addendum)
Attempted to take pt vitals. Pt was agitated and yelling "no" loudly repeatedly in room. Asked pt if could take vitals, refused, shook head, yelled and buried head in hands. Will reattempt later.
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
## 2021-07-27 NOTE — ED Notes (Signed)
Pt given meal tray.

## 2021-07-28 MED ORDER — LORAZEPAM 2 MG PO TABS
2.0000 mg | ORAL_TABLET | Freq: Once | ORAL | Status: AC
  Administered 2021-07-28: 2 mg via ORAL
  Filled 2021-07-28: qty 1

## 2021-07-28 NOTE — ED Notes (Signed)
Pt given dinner tray.

## 2021-07-28 NOTE — ED Notes (Signed)
Pt asleep at this time, unable to collect vitals. Will collect pt vitals once awake. 

## 2021-07-28 NOTE — ED Notes (Signed)
Pt given breakfast tray

## 2021-07-28 NOTE — ED Notes (Signed)
Pt given lunch tray.

## 2021-07-28 NOTE — ED Provider Notes (Signed)
-----------------------------------------   7:37 AM on 07/28/2021 -----------------------------------------   Blood pressure (!) 152/102, pulse 99, temperature 98.4 F (36.9 C), temperature source Oral, resp. rate 18, height 1.727 m (5\' 8" ), weight 79.4 kg, SpO2 99 %.  The patient is calm and cooperative at this time.  There have been no acute events since the last update.  Awaiting disposition plan from [facility] team.   [facility], MD 07/28/21 [phone]

## 2021-07-28 NOTE — ED Notes (Signed)
VOL/pending placement 

## 2021-07-29 NOTE — ED Notes (Signed)
Pt is back out of room. Redirected to room. He is yelling words that dont have meaning.

## 2021-07-29 NOTE — ED Notes (Signed)
Breakfast tray provided. 

## 2021-07-29 NOTE — ED Notes (Signed)
Pt is up and came to nurses station. Pt was given phone and sent back to his room.

## 2021-07-29 NOTE — ED Provider Notes (Signed)
Emergency Medicine Observation Re-evaluation Note  Adam [name] is a 60 y.o. male, seen on rounds today.  Pt initially presented to the ED for complaints of Psychiatric Evaluation Currently, the patient is sleeping comfortably, denies complaints when woken.  Physical Exam  BP (!) 146/110 (BP Location: Left Arm)   Pulse (!) 106   Temp 98 F (36.7 C) (Oral)   Resp 18   Ht 5\' 8"  (1.727 m)   Wt 79.4 kg   SpO2 96%   BMI 26.61 kg/m  Physical Exam Constitutional: Resting comfortably. Eyes: Conjunctivae are normal. Head: Atraumatic. Nose: No congestion/rhinnorhea. Mouth/Throat: Mucous membranes are moist. Neck: Normal ROM Cardiovascular: No cyanosis noted. Respiratory: Normal respiratory effort. Gastrointestinal: Non-distended. Genitourinary: deferred Musculoskeletal: No lower extremity tenderness nor edema. Neurologic:  Normal speech and language. No gross focal neurologic deficits are appreciated. Skin:  Skin is warm, dry and intact. No rash noted.   ED Course / MDM  EKG:EKG Interpretation  Date/Time:  Thursday July 20 2021 18:24:24 EDT Ventricular Rate:  71 PR Interval:  168 QRS Duration: 116 QT Interval:  392 QTC Calculation: 425 R Axis:   -6 Text Interpretation: Normal sinus rhythm Incomplete right bundle branch block Borderline ECG ----------unconfirmed---------- Confirmed by UNCONFIRMED, DOCTOR (04-13-1990), editor [phone]) on 07/21/2021 9:32:41 AM  I have reviewed the labs performed to date as well as medications administered while in observation.  Recent changes in the last 24 hours include none.  Plan  Current plan is for placement per social work.  Adam [name] is not under involuntary commitment.     [name], MD 07/29/21 [phone]

## 2021-07-29 NOTE — ED Notes (Addendum)
Pt to nurses station asking for meds. Redirected back to his bed

## 2021-07-29 NOTE — ED Notes (Signed)
Pt is in and out of his room multiple times. He is redirected to his room with no difficulty.

## 2021-07-29 NOTE — ED Notes (Signed)
Pt is uncooperative with medication administration. Pt takes medications from nurse and puts them in mouth. Pt immediately spits them into drink while pretending to take them. Pt is then upset with nurse due to attempting to remove cup with pills spit in it. Pt then attempts to take the dissolved tablets in water and the capsules. Places in mouth and pretends to swallow them. Pt then refuses to open mouth to show that he swallowed capsules. Pt then gets frustrated and drinks rest of medication filled water and appears to swallow all. This nurse checks pt cheeck line, roof of mouth, and under tongue.no sign of medication is seen in mouth.  It was reported to this nurse that pt had cheeked medication during day time and pt had stuffed them under garage door in room. Pt did not get up out of bed or attempt to go to this area at this time. Will continue to monitor.

## 2021-07-29 NOTE — ED Notes (Signed)
Report received from Chrystal, RN including SBAR. Patient alert and oriented, warm and dry, and in no acute distress. Patient denies SI, HI, AVH and pain. Patient made aware of Q15 minute rounds and Rover and Officer presence for their safety. Patient instructed to come to this nurse with needs or concerns. 

## 2021-07-29 NOTE — ED Notes (Signed)
Pt out of room yelling "Coca-Cola" repeatedly. Pt given a cola and he went back to his room.

## 2021-07-29 NOTE — ED Notes (Signed)
Pt pointed to garage door in room. Charge RN raised garage door and we found multiple pills and crushed pills under the door. Cleaned up pills and garage door was closed.

## 2021-07-29 NOTE — ED Notes (Signed)
Pt refused vitals 

## 2021-07-29 NOTE — TOC Initial Note (Addendum)
Transition of Care [facility]) - Initial/Assessment Note    Patient Details  Name: Adam [name] MRN: [id number] Date of Birth: 1961/04/05  [facility] of [facility] CM/SW Contact:    Verna Czech [place], [place] Phone Number: [phone] 07/29/2021, 10:43 AM  Clinical Narrative:                 Consult received to assist with group home placement for patient. Patient was a previous resident at [facility]-(Mr. Clemmons group home Director [phone])  Per the group home Director, patient came from a facility in [facility] and had been at  [facility] for approximately 3 weeks. Mr. Jolaine Click confirms that based on patient's behaviors while at the group home he cannot return. Patient has private insurance and was paying out of pocket for placement. Insurance information provided [employer] [id number]. Patient's mother's contact information provided-[place] [phone].  1:04pm Spoke with patient's mother [place] briefly who stated that she understands that patient cannot return to the group home and has scheduled an appointment with an attorney next week to discuss guardianship or assigning patient a Guardian Ad litem. Per patient's mother, this attorneys office is knowledgeable of possible facilities that could be considered for placement.  Transition of care Team to continue to follow to assist with patient's placement needs.  Expected Discharge Plan: Group Home Barriers to Discharge: Continued Medical Work up   Patient Goals and CMS Choice        Expected Discharge Plan and Services Expected Discharge Plan: Group Home In-house Referral: Clinical Social Work     Living arrangements for the past 2 months: Group Home                                      Prior Living Arrangements/Services Living arrangements for the past 2 months: Group Home Lives with:: Facility Resident          Need for Family Participation in Patient Care: Yes  (Comment) Care giver support system in place?: Yes (comment) (Mother Cyprus [phone])      Activities of Daily Living      Permission Sought/Granted                  Emotional Assessment           Psych Involvement: Yes (comment)  Admission diagnosis:  IVC Patient Active Problem List   Diagnosis Date Noted   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 [place] [address] Phone: [phone] Fax: [phone]     Social Determinants of Health (SDOH) Interventions    Readmission Risk Interventions No flowsheet data found.

## 2021-07-30 MED ORDER — ZIPRASIDONE MESYLATE 20 MG IM SOLR
20.0000 mg | Freq: Once | INTRAMUSCULAR | Status: AC
  Administered 2021-07-30: 20 mg via INTRAMUSCULAR

## 2021-07-30 NOTE — ED Notes (Signed)
Pt asleep at this time, unable to collect vitals. Will collect pt vitals once awake. 

## 2021-07-30 NOTE — ED Notes (Signed)
Pt wandered into hallway and redirected to room. Pt in room yelling.

## 2021-07-30 NOTE — ED Notes (Addendum)
Patient in hall unable to be redirected by staff. Pt began yelling "no" very loudly and proceeded to try to walk toward exit. This RN stopped patient and security helped to escort patient back to room due to patient trying to kick and hit this RN. Patient redirected to room using STARR technique. IM medications ordered and administered by this RN. Patient took medications willingly, no manual hold required at this time.

## 2021-07-30 NOTE — ED Notes (Signed)
Pt still wandering but remains in room. Pt not attempting to leave. Pt asked for bible. Bible provided for patient.

## 2021-07-30 NOTE — ED Notes (Signed)
Pt still refusing to allow staff to get vitals.

## 2021-07-30 NOTE — ED Notes (Signed)
Pt spitting medications back into drink. Pt keeps yelling "no" when encouraged to take medications.

## 2021-07-30 NOTE — ED Notes (Signed)
Pt yelled at ED tech when she attempted again to get VS.

## 2021-07-30 NOTE — ED Notes (Signed)
Pt refusing vitals at this time. Will attempt to obtain vitals again at later time.

## 2021-07-30 NOTE — ED Notes (Signed)
VOL/pending SW placement 

## 2021-07-30 NOTE — Progress Notes (Signed)
   07/30/21 1025  Clinical Encounter Type  Visited With Patient  Visit Type Initial;Spiritual support;Social support  Referral From Other (Comment) (rounding)  Spiritual Encounters  Spiritual Needs Other (Comment) (general support)  Chaplain Burris attempted to engage Pt who seemed occasionally agitated and who was also frequently attempting to come out of his room. Pt responded positively to chaplain's greeting and when chaplain moved mask to show him a smile. Presence of security guard reinitiated agitation. Will continue to attempt positive engagement and to try to keep Pt feeling calm and supported.

## 2021-07-31 MED ORDER — IBUPROFEN 600 MG PO TABS
600.0000 mg | ORAL_TABLET | Freq: Once | ORAL | Status: AC
  Administered 2021-07-31: 600 mg via ORAL
  Filled 2021-07-31: qty 1

## 2021-07-31 NOTE — ED Notes (Signed)
Pt given tooth brush and tooth paste

## 2021-07-31 NOTE — ED Notes (Signed)
Pt out of room multiple times suggesting that he wants a drink. Pt unable to communicate what drink he wants, Pt yells "No" and closes door. Pattricia Boss, RN speaking with pt, wants orange juice. Pt provided with orange juice, is accepting. Will continue to monitor.

## 2021-07-31 NOTE — ED Notes (Signed)
Toothbrush and toothpaste retrieved from Pt after use.

## 2021-07-31 NOTE — ED Notes (Signed)
Pt refuses vital signs check.

## 2021-07-31 NOTE — ED Notes (Addendum)
Pt given breakfast tray

## 2021-07-31 NOTE — ED Notes (Addendum)
Pt refusing to take medications or allow staff to take vitals at this time.but  Pt is calm currently.

## 2021-07-31 NOTE — ED Provider Notes (Signed)
-----------------------------------------   6:52 AM on 07/31/2021 -----------------------------------------   Blood pressure 101/64, pulse 70, temperature 98.5 F (36.9 C), resp. rate 18, height 5\' 8"  (1.727 m), weight 79.4 kg, SpO2 95 %.  The patient is calm and cooperative at this time.  There have been no acute events since the last update.  Awaiting disposition plan from Social Work team.   , MD 07/31/21 [phone]

## 2021-07-31 NOTE — TOC Progression Note (Signed)
Transition of Care [facility] - Progression Note    Patient Details  Name: Adam [name] MRN: [id number] Date of Birth: [birth year]  Transition of Care [facility]) CM/SW Contact  [name] Phone Number: [phone] 07/31/2021, 3:43 PM  Clinical Narrative:     CSW spoke with patient's mother [name] [phone], about discharge plan.  [name] is the patient's durable POA.  [name] stated she is aware the patient's behaviors are a barrier to placement.  CSW and [name] discussed patient's Medicaid and Disability status.  [name] stated she is scheduled to meet with a Medicaid lawyer on Thursday 08/03/2021, and will contact this CSW once she has more information on the patient's status. CSW explained the role of TOC in patient and informed Ms. Zion about different options for placement and the estimated timeline for placement. Ms. Munyon stated [name] from [facility] [phone] is following patient.  CSW called [name] w/ [facility] [phone], w/ update in formation on my conversation with Ms. Westberg.   Expected Discharge Plan: Group Home Barriers to Discharge: Continued Medical Work up  Expected Discharge Plan and Services Expected Discharge Plan: Group Home In-house Referral: Clinical Social Work     Living arrangements for the past 2 months: Group Home                                       Social Determinants of Health (SDOH) Interventions    Readmission Risk Interventions No flowsheet data found.

## 2021-08-01 NOTE — ED Notes (Signed)
RN in to give scheduled medications at this time. As soon as RN entered room and pt seen medications in RN hand he started yelling "no,no,no". RN informed pt of medication to be given and he continued yelling "no,no,no". Unable to administer meds at this time

## 2021-08-01 NOTE — ED Notes (Signed)
Dinner tray delivered. Pt eating dinner. 

## 2021-08-01 NOTE — ED Notes (Signed)
Report received from Katie, RN including SBAR. Patient alert and oriented, warm and dry, and in no acute distress. Patient denies SI, HI, AVH and pain. Patient made aware of Q15 minute rounds and Rover and Officer presence for their safety. Patient instructed to come to this nurse with needs or concerns.  ?

## 2021-08-01 NOTE — ED Notes (Addendum)
Meal tray delivered. Pt is eating lunch.

## 2021-08-01 NOTE — ED Notes (Signed)
VOL/Pending Placement 

## 2021-08-01 NOTE — ED Notes (Signed)
Pt out of room to desk, past security. Pt looks at nurse and points to nurses pen at desk and states "yes." Pt is informed that he cannot have pen as it is per policy. Pt yells "no." pt then looks to COW where Tech is sitting and stares at [employer]. Tech moves ben to behind safety glass at desk and pt attempts to reach under it to obtain pen. Pen is moved out of way and pt starts to repeatedly yell "no." Pt then attmepts to leave unit but is stopped without any physical activity by this nurse, Tech-John, and Security. Pt is verbally deescalated to an extent and then returns to room where he repeatedly yells "no" and "steak police". Unable to calm pt but pt remains in room

## 2021-08-01 NOTE — ED Notes (Signed)
VOL, pending placement 

## 2021-08-01 NOTE — ED Notes (Signed)
Pt provided with shower supplies by EDT Felicia.

## 2021-08-01 NOTE — ED Provider Notes (Signed)
-----------------------------------------   6:32 AM on 08/01/2021 -----------------------------------------   Blood pressure (!) 141/91, pulse 79, temperature 98.1 F (36.7 C), temperature source Oral, resp. rate 16, height 5\' 8"  (1.727 m), weight 79.4 kg, SpO2 97 %.  The patient is calm and cooperative at this time.  There have been no acute events since the last update.  Awaiting disposition plan from Social Work team(s).    , MD 08/01/21 [phone]

## 2021-08-01 NOTE — ED Notes (Signed)
Attempted to obtain patient's vital signs at this time. Pt unwilling to have blood pressure taken, when asked responds "no, no, no".

## 2021-08-01 NOTE — ED Notes (Signed)
Pt. Was provided with a tray and a drink.

## 2021-08-01 NOTE — ED Notes (Signed)
Pt comes out of rm and hands this Clinical research associate the phone. Pt then continues to walk around nurses station, Security Will able to verbally talk pt into returning to rm.

## 2021-08-01 NOTE — ED Notes (Signed)
Breakfast given to pt

## 2021-08-01 NOTE — ED Notes (Signed)
Pt refused VS at this time. RN informed.

## 2021-08-02 NOTE — ED Notes (Signed)
Pt had stool on his bed and floor. Pt was given supplies to shower. This tech and Bill, RN cleaned pt rm and changed linen on pt bed.

## 2021-08-02 NOTE — ED Notes (Signed)
Pt returned spoon to EDT at nursing station.

## 2021-08-02 NOTE — ED Notes (Signed)
Pt asleep at this time, unable to collect vitals. Will collect pt vitals once awake. 

## 2021-08-02 NOTE — ED Notes (Signed)
Pt given meal tray.

## 2021-08-02 NOTE — ED Notes (Signed)
Pt came out of bathroom with shirt and underwear on. Pt looked into his rm and started to yell. rm was mopped by EVS. Pt refused to put on blue pants and was informed that we did not have any burgundy pants. It appears that pt did not shower.

## 2021-08-02 NOTE — ED Notes (Signed)
Pt given spoon to eat apple sauce, instructed to return spoon to RN or EDT when finished.

## 2021-08-02 NOTE — ED Notes (Signed)
Pt out to desk rubbing hands together, requesting lotion.

## 2021-08-02 NOTE — ED Notes (Addendum)
Pt standing in rm with just a shirt on. This tech placed breakfast tray on sink in rm. Pt began to yell no repeatedly. Pt came out in hallway with just shirt on once again. Pt again informed that he could not come into hallway with no pants on. Bill, RN in rm to speak with pt.

## 2021-08-02 NOTE — ED Notes (Signed)
Pt asleep, lunch tray placed on sink in rm.  

## 2021-08-02 NOTE — ED Notes (Signed)
Pt remains vol w/ placement pending.  Most recent CSW note on chart

## 2021-08-02 NOTE — ED Provider Notes (Signed)
-----------------------------------------   8:34 AM on 08/02/2021 -----------------------------------------   Blood pressure (!) 147/93, pulse 82, temperature 99.1 F (37.3 C), temperature source Oral, resp. rate 18, height 1.727 m (5\' 8" ), weight 79.4 kg, SpO2 97 %.  The patient is calm and cooperative at this time.  There have been no acute events since the last update.  Awaiting disposition plan from [facility] team.   [facility], MD 08/02/21 [phone]

## 2021-08-02 NOTE — ED Notes (Signed)
Pt visualized ambulating into hallway with just a shirt on. Pt looked in the bathroom and went back inside his rm. This tech informed pt that he could not come out in the hallway without any pants on.

## 2021-08-03 MED ORDER — ZIPRASIDONE MESYLATE 20 MG IM SOLR
20.0000 mg | Freq: Once | INTRAMUSCULAR | Status: AC
  Administered 2021-08-03: 20 mg via INTRAMUSCULAR

## 2021-08-03 MED ORDER — LORAZEPAM 1 MG PO TABS
1.0000 mg | ORAL_TABLET | Freq: Once | ORAL | Status: DC
Start: 2021-08-03 — End: 2021-08-08
  Filled 2021-08-03 (×2): qty 1

## 2021-08-03 NOTE — ED Notes (Signed)
Chaplain at bedside. Pt refused vital signs. Chaplain as witness.

## 2021-08-03 NOTE — ED Notes (Signed)
Pt is provided with snack as requested, pt upset due to no spoon with his ice cream. Pt throws this cup into hallway and yells in room.

## 2021-08-03 NOTE — Progress Notes (Signed)
   08/03/21 1350  Clinical Encounter Type  Visited With Patient  Visit Type Follow-up;Spiritual support;Social support  Referral From Other (Comment) (rounding)  Spiritual Encounters  Spiritual Needs Emotional;Prayer  Chaplain Burris engaged Pt and asked how he was coping. Pt showed good recognition of chaplain and seemed glad for the visit. Chaplain Burris practiced some deep breathing with Pt who appeared to enjoy this practice. Chaplain Burris offered words of support and encouragement and also responded with prayer in response to Pt's gestured in a way that indicated his desire to pray.

## 2021-08-03 NOTE — ED Notes (Signed)
This RN encouraged pt to get repeat vitals but pt still refused

## 2021-08-03 NOTE — ED Notes (Signed)
VOL  PENDING  PLACEMENT 

## 2021-08-03 NOTE — ED Notes (Signed)
Pt given lunch

## 2021-08-03 NOTE — ED Notes (Signed)
Refused vital signs

## 2021-08-03 NOTE — ED Notes (Signed)
Pt takes medications after attempting to spit back into cup and hide in room. This nurse communicates with patient that either he takes medications or they will be removed. Pt takes pills. When nurse entered room pt quickly hid something with a light lit on it under pillow. After pt takes medications, pt is questioned on what he has under pillow. Pt is uncooperative with showing this nurse. Pt eventually shows that he has telephone which he is not suppose to have in room at this time, this nurse requests item from patient. Pt yells at nurse repetitively "no" and grasps in hand while gripping other hand in fist. Pt is again asked several times for phone. Pt then attempts to dial a number rapidly, this nurse states to patient to stop and hand over phone and places hand out. Pt increases in volume and aggression and yanks phone away, pt is informed to hand phone to nurse which he eventually does. Pt continues to be aggressive physically and vocally toward nurse. Pt can be heard through ED and MD to unit to assess. Order to be placed

## 2021-08-03 NOTE — ED Notes (Signed)
Pt given breakfast.

## 2021-08-03 NOTE — ED Notes (Signed)
Dinner given to pt 

## 2021-08-03 NOTE — ED Notes (Signed)
Report received from [facility] RN including SBAR. Patient alert and oriented, warm and dry, and in no acute distress. Patient  Continues with minimal communication. Patient made aware of Q15 minute rounds and Psychologist, counselling presence for their safety. Patient instructed to come to this nurse with needs or concerns.

## 2021-08-03 NOTE — ED Notes (Signed)
Pt offered medication as ordered from MD. Pt takes cup from hand and throws pill onto floor and cup at this nurse, when nurse attempts to pick up empty medication cup he hits nurses hand. Pt continues with this behavior as described. Communication will be made with MD to develop further plan

## 2021-08-03 NOTE — ED Notes (Signed)
Pt takes medications willingly but continues to be loud, agitated, and aggressive. Continually unable to obtain vital signs due to patient behaviors

## 2021-08-03 NOTE — TOC Progression Note (Signed)
Transition of Care [facility]) - Progression Note    Patient Details  Name: Adam [name] MRN: [id number] Date of Birth: 02-08-1961  Transition [facility] Care [facility]) CM/SW Contact  Adam [name], [place] Phone Number: 08/03/2021, 12:16 PM  Clinical Narrative:     Patient's mother Adam [name] [phone] is meeting with Adam [name] attorney today 08/03/2021, to find out about patient's Medicaid eligibility.  Ms. Milian stated she would update this CSW once she had new information.  Expected Discharge Plan: Group Home Barriers to Discharge: Continued Medical Work up  Expected Discharge Plan and Services Expected Discharge Plan: Group Home In-house Referral: Clinical Social Work     Living arrangements for the past 2 months: Group Home                                       Social Determinants of Health (SDOH) Interventions    Readmission Risk Interventions No flowsheet data found.

## 2021-08-04 MED ORDER — ZIPRASIDONE MESYLATE 20 MG IM SOLR
20.0000 mg | Freq: Once | INTRAMUSCULAR | Status: AC
  Administered 2021-08-04: 20 mg via INTRAMUSCULAR
  Filled 2021-08-04: qty 20

## 2021-08-04 MED ORDER — ACETAMINOPHEN 325 MG PO TABS
ORAL_TABLET | ORAL | Status: AC
  Administered 2021-08-04: 650 mg via ORAL
  Filled 2021-08-04: qty 2

## 2021-08-04 MED ORDER — ACETAMINOPHEN 325 MG PO TABS: 650.0000 mg | ORAL_TABLET | Freq: Once | ORAL | Status: AC

## 2021-08-04 NOTE — ED Provider Notes (Signed)
-----------------------------------------   8:12 AM on 08/04/2021 -----------------------------------------   Blood pressure 129/87, pulse 87, temperature 98 F (36.7 C), temperature source Oral, resp. rate 12, height 1.727 m (5\' 8" ), weight 79.4 kg, SpO2 98 %.  The patient is calm and cooperative at this time.  There have been no acute events since the last update.  Awaiting disposition plan from [facility] team.   [facility], MD 08/04/21 [phone]

## 2021-08-04 NOTE — ED Notes (Signed)
Pt still in hold on bed by security due to escalating physical and verbal behavior.

## 2021-08-04 NOTE — ED Notes (Signed)
Pt given a chocolate milk per request.

## 2021-08-04 NOTE — ED Notes (Addendum)
Pt back of out of room, yelling at will not go back into room. Security placed patient into hold on bed due to patient escalating aggressive physical behaviors. Notified EDP and medication orders received.

## 2021-08-04 NOTE — ED Notes (Addendum)
Out of room. Having to be redirected frequently to room. Pt saying "no, no, no". Pt staring at officer, refused to be redirected to room Offered more drink, food, gave book. Unable to de-escalate patient.

## 2021-08-04 NOTE — ED Notes (Signed)
Pend TOC placement 

## 2021-08-04 NOTE — ED Notes (Signed)
Pt ate 100% of meal tray 

## 2021-08-04 NOTE — ED Notes (Signed)
Pt given lunch

## 2021-08-04 NOTE — ED Notes (Addendum)
Medication administered to patient while in temporary medication hold by security and [employer].Hold released immediately after medication administered and pt ambulates immediately after. No injuries sustained. Active movement by all extremities.

## 2021-08-04 NOTE — ED Notes (Signed)
Refused shower. 

## 2021-08-04 NOTE — ED Notes (Signed)
Pt given a cup of sprite per request.  

## 2021-08-04 NOTE — ED Provider Notes (Signed)
-----------------------------------------   11:28 PM on 08/04/2021 -----------------------------------------  Vitals:   08/04/21 1400 08/04/21 2138  BP: 135/85 (!) 150/77  Pulse: 87 92  Resp: 16 18  Temp: 98.2 F (36.8 C) 98.4 F (36.9 C)  SpO2: 98% 96%      The patient is calm and cooperative at this time.  He is currently watching television without distress.  There have been no acute events since the last update.  Awaiting disposition plan from [facility] team.     Sharyn Creamer, MD 08/04/21 2329
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
## 2021-08-04 NOTE — ED Notes (Signed)
VOL, pending placement 

## 2021-08-04 NOTE — ED Notes (Signed)
Pt given a [place] sandwich tray and a sprite per request.

## 2021-08-04 NOTE — ED Notes (Addendum)
[phone], Adam [name] called and updated on events leading up to medication administration and med administration hold by security. Pt mother and legal guardian verbalizes understanding and consent to treat.

## 2021-08-04 NOTE — TOC Progression Note (Signed)
Transition of Care [facility]) - Progression Note    Patient Details  Name: Adam [name] MRN: [id number] Date of Birth: 07-[name]-1961  [facility]) CM/SW Contact  Joseph Art, [place] Phone Number: 08/04/2021, 4:20 PM  Clinical Narrative:     Pending group home placement.  CSW sent FL2  to [facility] w/ [employer] [email or web address].  Patient's guardian Jamir, Rone (Mother) [phone] [place] [facility]) updated.   Expected Discharge Plan: Group Home Barriers to Discharge: Continued Medical Work up  Expected Discharge Plan and Services Expected Discharge Plan: Group Home In-house Referral: Clinical Social Work     Living arrangements for the past 2 months: Group Home                                       Social Determinants of Health (SDOH) Interventions    Readmission Risk Interventions No flowsheet data found.

## 2021-08-04 NOTE — ED Notes (Addendum)
Pt requesting tylenol--unable to explain to nurse what is hurting.

## 2021-08-04 NOTE — ED Notes (Signed)
PATIENT RESTING. REASSESS VITALS WHEN PATIENT IS AWAKE.

## 2021-08-04 NOTE — ED Notes (Signed)
Pt given snack of ice cream.

## 2021-08-04 NOTE — ED Notes (Signed)
Patient continues to yell out occasionally, now yelling Trump. Patient was happy to be given the telephone.

## 2021-08-04 NOTE — ED Notes (Signed)
Patient was compliant with taken medications.

## 2021-08-04 NOTE — ED Notes (Signed)
attempted to get pt vital signs post hold release. Pt yelling, refusing and pushing dinamap out of room.

## 2021-08-04 NOTE — ED Notes (Signed)
Offered 2PM medications, refused.

## 2021-08-04 NOTE — ED Notes (Addendum)
Pt out of room, trying to walk down the hallway. [employer] officer stood in front of patient and attempted to direct him back to room, verbally-unsuccessful. Pt refused. [employer] and Security had to escort patient back to room and into bed. Pt yelling

## 2021-08-04 NOTE — ED Notes (Signed)
Refused vital signs. Pt pushed the dinamap out of room and yelled "no, no, no".

## 2021-08-04 NOTE — ED Notes (Signed)
Standing at door singing.

## 2021-08-05 MED ORDER — HALOPERIDOL 5 MG PO TABS
10.0000 mg | ORAL_TABLET | Freq: Three times a day (TID) | ORAL | Status: DC
  Administered 2021-08-05 – 2021-08-26 (×51): 10 mg via ORAL
  Filled 2021-08-05 (×60): qty 2

## 2021-08-05 MED ORDER — HALOPERIDOL LACTATE 5 MG/ML IJ SOLN
5.0000 mg | Freq: Three times a day (TID) | INTRAMUSCULAR | Status: DC
  Administered 2021-08-07 – 2021-08-08 (×3): 5 mg via INTRAMUSCULAR
  Filled 2021-08-05 (×8): qty 1

## 2021-08-05 NOTE — ED Notes (Signed)
VOL/pending placement 

## 2021-08-05 NOTE — ED Notes (Signed)
Pt took pills. RN made him open his mouth. Pt swallowed all morning pills and went back to bed,

## 2021-08-05 NOTE — ED Notes (Signed)
Pt stated that he wanted a shower however hours have passed for a shower; pt stated he is okay waiting until morning and denied the offer of using wipes and a comb at this time.

## 2021-08-05 NOTE — ED Notes (Addendum)
Pt kept yelling for his stuff. He saw other bags of stuff under the desk. Pt kept yelling stuff and as getting very agitated. Pt is not currently an IVC patient. I went and got him his stuff to de-escalate the situation. Pt changed his clothes and was then very calm.

## 2021-08-05 NOTE — ED Notes (Signed)
Gave pt breakfast tray

## 2021-08-05 NOTE — TOC Progression Note (Signed)
Transition of Care [facility]) - Progression Note    Patient Details  Name: Adam [name] MRN: [id number] Date of Birth: 09-Apr-1961  Transition of Care [facility]) CM/SW Contact  Gildardo Griffes, [place] Phone Number: 08/05/2021, 9:22 AM  Clinical Narrative:     CSW spoke with patient's guardian Cyprus Arentz at [phone] who reports after speaking with her lawyer she should have more information about supplemental medicaid assistance by next week, as they continue to investigate. Acknowledges this would assist in increasing placement options. Reports no questions or concerns at this time.   TOC to continue to follow.    Expected Discharge Plan: Group Home Barriers to Discharge: Continued Medical Work up  Expected Discharge Plan and Services Expected Discharge Plan: Group Home In-house Referral: Clinical Social Work     Living arrangements for the past 2 months: Group Home                                       Social Determinants of Health (SDOH) Interventions    Readmission Risk Interventions No flowsheet data found.

## 2021-08-05 NOTE — ED Notes (Signed)
Pt given sandwich tray, sprite

## 2021-08-05 NOTE — ED Notes (Signed)
Pt is more agitated than he has been all morning. He goes back to his room easily once redirected.

## 2021-08-05 NOTE — ED Notes (Signed)
Attempted to obtain vitals. Pt refused.

## 2021-08-06 MED ORDER — ACETAMINOPHEN 325 MG PO TABS
650.0000 mg | ORAL_TABLET | Freq: Once | ORAL | Status: AC
  Administered 2021-08-06: 650 mg via ORAL
  Filled 2021-08-06: qty 2

## 2021-08-06 NOTE — ED Notes (Addendum)
Pt wanting new armband with the names Adam [name] and Adam [name] on them.  Pt given fake armbands to wear to keep his behavior under control.    **Pt's real armband is at nurse's station.   Charge nurse aware

## 2021-08-06 NOTE — ED Notes (Signed)
Pt wanting to use the phone. Pt will be given the phone as soon as it is available.

## 2021-08-06 NOTE — ED Notes (Signed)
Pt coming to the nurse's desk shouting "Adam [name]."

## 2021-08-06 NOTE — ED Notes (Signed)
Pt to nurse's station saying "tylenol."  RN asked pt is he had a headache and pt nodded yes. EDP made aware.

## 2021-08-06 NOTE — ED Notes (Signed)
Pt up to bathroom.

## 2021-08-06 NOTE — ED Notes (Signed)
Breakfast tray given. °

## 2021-08-06 NOTE — ED Notes (Signed)
Vol /pending placement 

## 2021-08-06 NOTE — ED Notes (Signed)
Room lights turned on.

## 2021-08-06 NOTE — ED Notes (Signed)
Pt is frustrated and shouting from his room.  Pt able to be verbal redirected to his room and given the phone.

## 2021-08-06 NOTE — ED Notes (Signed)
Pt continues to say 8  13 and 1931.  Unsure what these numbers mean.

## 2021-08-06 NOTE — ED Provider Notes (Signed)
-----------------------------------------   7:10 AM on 08/06/2021 -----------------------------------------   Blood pressure 133/89, pulse 89, temperature 98.1 F (36.7 C), temperature source Oral, resp. rate 18, height 5\' 8"  (1.727 m), weight 79.4 kg, SpO2 97 %.  The patient is calm and cooperative at this time.  There have been no acute events since the last update.  Awaiting disposition plan from the [facility] team.    [facility], MD 08/06/21 [phone]

## 2021-08-06 NOTE — ED Notes (Signed)
Pt to nurse's desk making gestures, not aggressive.  Crystal RN able to determine pt wanted a new arm band.

## 2021-08-06 NOTE — ED Notes (Signed)
Pt given drink 

## 2021-08-07 DIAGNOSIS — F209 Schizophrenia, unspecified: Secondary | ICD-10-CM

## 2021-08-07 MED ORDER — PALIPERIDONE ER 3 MG PO TB24
9.0000 mg | ORAL_TABLET | Freq: Every day | ORAL | Status: DC
  Administered 2021-08-08 – 2021-08-25 (×16): 9 mg via ORAL
  Filled 2021-08-07 (×19): qty 3

## 2021-08-07 MED ORDER — LORAZEPAM 2 MG/ML IJ SOLN: 1.0000 mg | Freq: Once | INTRAMUSCULAR | Status: DC

## 2021-08-07 MED ORDER — LORAZEPAM 2 MG/ML IJ SOLN
2.0000 mg | Freq: Once | INTRAMUSCULAR | Status: AC
  Administered 2021-08-07: 2 mg via INTRAMUSCULAR
  Filled 2021-08-07: qty 1

## 2021-08-07 MED ORDER — LORAZEPAM 2 MG/ML IJ SOLN
3.0000 mg | Freq: Once | INTRAMUSCULAR | Status: AC
  Administered 2021-08-07: 3 mg via INTRAMUSCULAR
  Filled 2021-08-07: qty 2

## 2021-08-07 MED ORDER — DROPERIDOL 2.5 MG/ML IJ SOLN
10.0000 mg | Freq: Once | INTRAMUSCULAR | Status: AC
  Administered 2021-08-07: 10 mg via INTRAMUSCULAR

## 2021-08-07 MED ORDER — TEMAZEPAM 7.5 MG PO CAPS
15.0000 mg | ORAL_CAPSULE | Freq: Every day | ORAL | Status: DC
  Administered 2021-08-07 – 2021-08-08 (×2): 15 mg via ORAL
  Filled 2021-08-07 (×2): qty 1
  Filled 2021-08-07: qty 2

## 2021-08-07 MED ORDER — CHLORPROMAZINE HCL 25 MG/ML IJ SOLN
50.0000 mg | Freq: Once | INTRAMUSCULAR | Status: AC
  Administered 2021-08-07: 50 mg via INTRAMUSCULAR
  Filled 2021-08-07: qty 2

## 2021-08-07 NOTE — ED Notes (Signed)
Pt again attempts to leave unit and go to EMS bays. Pt is redirected by security and [employer] back to room. This nurse exits room beside patient and talks to patient, pt repeatedly yelling "madonna" and "no"

## 2021-08-07 NOTE — ED Notes (Addendum)
Pt provided with hygiene products and taking shower at this time

## 2021-08-07 NOTE — ED Notes (Signed)
Pt attempts to run away from unit and to EMS bay doors. Pt is cut off and stopped by this nurse and security. Pt is walked back to room via STARR measures by Security team. Pt goes and sits on bed and yells again at staff

## 2021-08-07 NOTE — ED Notes (Signed)
Pt refusing to put on scrubs and remains in street clothes at this time

## 2021-08-07 NOTE — Consult Note (Signed)
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 male patient admitted with patient is not able to give any articulate history.  HPI: See previous notes.  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  Patient has been "psych cleared" and is awaiting placement.  Meanwhile today he was much more agitated than he had been last time I saw him.  Shouting much of the day.  Not hostile exactly towards anyone but very disruptive and loud.  Past Psychiatric History: Past history of stabilization on antipsychotics but since having this severe brain injury seems to have remained constantly symptomatic and had difficulty staying in any placement  Risk to Self:   Risk to Others:   Prior Inpatient Therapy:   Prior Outpatient Therapy:    Past Medical History: No past medical history on file.  Family History: No family history on file. Family Psychiatric  History: See previous Social History:  Social History   Substance and Sexual Activity  Alcohol Use Not on file     Social History   Substance and Sexual Activity  Drug Use Not on file    Social History   Socioeconomic History   Marital status: Single    Spouse name: Not on file   Number of children: Not on file   Years of education: Not on file   Highest education level: Not on file  Occupational History   Not on file  Tobacco Use   Smoking  status: Not on file   Smokeless tobacco: Not on file  Substance and Sexual Activity   Alcohol use: Not on file   Drug use: Not on file   Sexual activity: Not on file  Other Topics Concern   Not on file  Social History Narrative   Not on file   Social Determinants of Health   Financial Resource Strain: Not on file  Food Insecurity: Not on file  Transportation Needs: Not on file  Physical Activity: Not on file  Stress: Not on file  Social Connections: Not on file   Additional Social History:    Allergies:   Allergies  Allergen Reactions   Morphine And Related    Nsaids     Labs: No results found for this or any previous visit (from the past 48 hour(s)).  Current Facility-Administered Medications  Medication Dose Route Frequency Provider Last Rate Last Admin   benztropine (COGENTIN) tablet 1 mg  1 mg Oral BID Charm Rings, NP   1 mg at 08/07/21 1020   chlorproMAZINE (THORAZINE) injection 50 mg  50 mg Intramuscular Once Charm Rings, NP       cholecalciferol (VITAMIN D3) tablet 1,000 Units  1,000 Units Oral Daily Charm Rings, NP   1,000 Units at 08/07/21 1020   famotidine (PEPCID) tablet 20 mg  20 mg Oral BID PRN Charm Rings, NP   20 mg at 08/02/21 2774   folic acid (FOLVITE) tablet 1 mg  1 mg Oral Daily Charm Rings, NP  1 mg at 08/07/21 1020   gabapentin (NEURONTIN) capsule 300 mg  300 mg Oral TID Charm Rings, NP   300 mg at 08/07/21 1021   haloperidol (HALDOL) tablet 10 mg  10 mg Oral TID Charm Rings, NP   10 mg at 08/07/21 1020   Or   haloperidol lactate (HALDOL) injection 5 mg  5 mg Intramuscular TID Charm Rings, NP   5 mg at 08/07/21 1525   LORazepam (ATIVAN) tablet 1 mg  1 mg Oral TID Charm Rings, NP   1 mg at 08/07/21 1021   LORazepam (ATIVAN) tablet 1 mg  1 mg Oral Once Gilles Chiquito, MD       multivitamin with minerals tablet 1 tablet  1 tablet Oral Daily Charm Rings, NP   1 tablet at 08/07/21 1021   [START ON 08/08/2021]  paliperidone (INVEGA) 24 hr tablet 9 mg  9 mg Oral Q1400 Emaree Chiu T, MD       temazepam (RESTORIL) capsule 15 mg  15 mg Oral QHS Emori Mumme T, MD   15 mg at 08/06/21 2125   thiamine tablet 100 mg  100 mg Oral Daily Charm Rings, NP   100 mg at 08/07/21 1020   ziprasidone (GEODON) injection 20 mg  20 mg Intramuscular Once Irean Hong, MD       Current Outpatient Medications  Medication Sig Dispense Refill   benztropine (COGENTIN) 1 MG tablet Take 1 mg by mouth daily.     cholecalciferol (VITAMIN D3) 25 MCG (1000 UNIT) tablet Take 1,000 Units by mouth daily.     famotidine (PEPCID) 20 MG tablet Take 20 mg by mouth 2 (two) times daily as needed for heartburn or indigestion.     folic acid (FOLVITE) 1 MG tablet Take 1 mg by mouth daily.     gabapentin (NEURONTIN) 300 MG capsule Take 300 mg by mouth 3 (three) times daily.     haloperidol (HALDOL) 5 MG tablet Take 5 mg by mouth 3 (three) times daily.     LORazepam (ATIVAN) 1 MG tablet Take 1 mg by mouth 3 (three) times daily.     Multiple Vitamin (MULTIVITAMIN) tablet Take 1 tablet by mouth daily.     paliperidone (INVEGA SUSTENNA) 234 MG/1.5ML SUSY injection Inject 234 mg into the muscle every 28 (twenty-eight) days.     thiamine (VITAMIN B-1) 100 MG tablet Take 100 mg by mouth daily.     traZODone (DESYREL) 150 MG tablet Take 150 mg by mouth at bedtime.      Musculoskeletal: Strength & Muscle Tone: within normal limits Gait & Station: normal Patient leans: N/A            Psychiatric Specialty Exam:  Presentation  General Appearance: Bizarre  Eye Contact:Poor  Speech:Garbled  Speech Volume:Increased  Handedness:Right   Mood and Affect  Mood:Euphoric  Affect:Inappropriate; Congruent   Thought Process  Thought Processes:Disorganized  Descriptions of Associations:Loose  Orientation:Partial  Thought Content:Delusions; Illogical; Tangential  History of Schizophrenia/Schizoaffective  disorder:Yes  Duration of Psychotic Symptoms:Greater than six months  Hallucinations:No data recorded Ideas of Reference:Delusions  Suicidal Thoughts:No data recorded Homicidal Thoughts:No data recorded  Sensorium  Memory:Immediate Poor; Recent Poor; Remote Poor  Judgment:Impaired  Insight:-- (Unable to assess)   Executive Functions  Concentration:Poor  Attention Span:Poor  Recall:Poor  Fund of Knowledge:Poor  Language:Poor   Psychomotor Activity  Psychomotor Activity: No data recorded  Assets  Assets:Housing; Resilience; Social Support; Talents/Skills   Sleep  Sleep: No data recorded  Physical Exam: Physical Exam Vitals and nursing note reviewed.  Constitutional:      Appearance: Normal appearance.  HENT:     Head: Normocephalic and atraumatic.     Mouth/Throat:     Pharynx: Oropharynx is clear.  Eyes:     Pupils: Pupils are equal, round, and reactive to light.  Cardiovascular:     Rate and Rhythm: Normal rate and regular rhythm.  Pulmonary:     Effort: Pulmonary effort is normal.     Breath sounds: Normal breath sounds.  Abdominal:     General: Abdomen is flat.     Palpations: Abdomen is soft.  Musculoskeletal:        General: Normal range of motion.  Skin:    General: Skin is warm and dry.  Neurological:     General: No focal deficit present.     Mental Status: He is alert. Mental status is at baseline.  Psychiatric:        Attention and Perception: He is inattentive.        Mood and Affect: Affect is labile and inappropriate.        Speech: He is noncommunicative.        Behavior: Behavior is agitated.        Cognition and Memory: Cognition is impaired.        Judgment: Judgment is inappropriate.   Review of Systems  Unable to perform ROS: Patient nonverbal  Blood pressure (!) 146/93, pulse 82, temperature 98.2 F (36.8 C), temperature source Oral, resp. rate 16, height 5\' 8"  (1.727 m), weight 79.4 kg, SpO2 95 %. Body mass index is  26.61 kg/m.  Treatment Plan Summary: Increase invega to 9mg  daily  Disposition: Supportive therapy provided about ongoing stressors.  , MD 08/07/2021 5:26 PM

## 2021-08-07 NOTE — ED Notes (Signed)
Pt attempts to reach under glass barrier at desk and grab items, pt has pulled bracelet off of wrist and is unhappy it is in the trash. Pt stares at other patient in hallway. Will continue to monitor.

## 2021-08-07 NOTE — ED Notes (Signed)
Pt continues to yell intermittently, patient into hallway off and on, directed back to room. Pt is pointing toward exit of ER repeatedly, this nurse attempts to communicate with patient on what he needs but everything else around that general area he states no.

## 2021-08-07 NOTE — ED Notes (Addendum)
This RN was in another pt's room when this pt came out of room and rushed down hallway. Pt proceeded to exit through EMS bay doors and go into parking lot. Pt redirected back to room by security. Md aware and trying to contact legal guardian. Pt remains voluntary at this time.

## 2021-08-07 NOTE — ED Notes (Signed)
This nurse has continued to stay by patients side with no opportunity to leave patient area for more than a few second. Pt repeatedly yelling "madonna" "yes bra" and "no." Staff continues to attempt to deescalate patient with assistance from security and PD officer in quad.

## 2021-08-07 NOTE — ED Notes (Signed)
Pt repeatedly coming out of room. This RN directed pt into room. Pt pulling t pants and pulling them down. RN directed pt to pull pants up and encouraging pt to change into clean scrubs. Pt yelling at this RN

## 2021-08-07 NOTE — ED Notes (Signed)
Pt mother called. Phone given to pt to speak with mom
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
## 2021-08-07 NOTE — ED Notes (Signed)
Pt wandering out to nurses station pointed at dinamap and yelling. RN redirected pt back to room and took vitals. Pt refusing shower at this time

## 2021-08-07 NOTE — ED Notes (Signed)
Pt given meal tray.

## 2021-08-07 NOTE — ED Notes (Addendum)
Pt out of room wandering up and down the hallway. Pt refusing breakfast tray that was given to him and trying to take breakfast trays that are on the counter. Pt snatched extra breakfast tray off of counter and stormed back to room. Pt is currently in street clothes from previous shift.

## 2021-08-07 NOTE — ED Provider Notes (Signed)
Emergency Medicine Observation Re-evaluation Note  Adam [name] is a 60 y.o. male, seen on rounds today.  Pt initially presented to the ED for complaints of Psychiatric Evaluation  Currently, the patient is in not acute distress   Physical Exam  Blood pressure 128/81, pulse 82, temperature 98.1 F (36.7 C), temperature source Oral, resp. rate 18, height 5\' 8"  (1.727 m), weight 79.4 kg, SpO2 100 %.  Physical Exam General: No apparent distress Psych: resting   ED Course / MDM   Clinical Course as of 08/07/21 0457  Thu Jul 20, 2021  2026 The patient has been placed in psychiatric observation due to the need to provide a safe environment for the patient while obtaining psychiatric consultation and evaluation, as well as ongoing medical and medication management to treat the patient's condition.  The patient has been placed under full IVC at this time.   [DS]    Clinical Course User Index [DS] Jul 22, 2021, MD    I have reviewed the labs performed to date as well as medications administered while in observation.  Recent changes in the last 24 hours include none   Plan   Current plan is to continue to wait for placement  Patient is not under full IVC at this time.   Delton [place], MD 08/07/21 [phone]

## 2021-08-07 NOTE — ED Notes (Addendum)
Pt out of room and walked into empty room 20 and threw his dirty underwear on the floor. Pt redirected back to his room by security. RN attempted to get pt to changed into scrubs. Pt still refusing and yelling at this RN

## 2021-08-07 NOTE — ED Notes (Signed)
Pt refused vitals to be taken. Gave pt food tray with juice.

## 2021-08-07 NOTE — ED Notes (Signed)
VOL/pending placement 

## 2021-08-07 NOTE — ED Notes (Signed)
Report received from Kelly, RN including SBAR. Patient alert and oriented, warm and dry, and in no acute distress. Patient denies SI, HI, AVH and pain. Patient made aware of Q15 minute rounds and Rover and Officer presence for their safety. Patient instructed to come to this nurse with needs or concerns.  

## 2021-08-07 NOTE — ED Notes (Signed)
Pt refused tray and set it outside his door on the floor.

## 2021-08-07 NOTE — ED Notes (Signed)
Pt continues to act out and attempt to exit unit. Pt is not calming down and deescalating measures are not working.

## 2021-08-07 NOTE — ED Notes (Signed)
Patient keeps yelling and will not listen.

## 2021-08-07 NOTE — ED Notes (Signed)
Pt refusing this RN to take vitals. Pt refusing to change into paper scrubs or wine color scrubs out of street clothes. Pt yelling

## 2021-08-07 NOTE — ED Notes (Signed)
Pt out of room repetitively. Pt is unable to communicate what he needs but keeps attempting to go behind nurses desk. Redirected by this nurse, Nicki Gu[place], Tech and security. Pt follows commands but returns to room and yells

## 2021-08-07 NOTE — ED Notes (Signed)
Gave pt sprite and is resting in room, but still shouting consistently.

## 2021-08-08 MED ORDER — LORAZEPAM 2 MG/ML IJ SOLN
3.0000 mg | Freq: Once | INTRAMUSCULAR | Status: AC
  Administered 2021-08-08: 3 mg via INTRAMUSCULAR
  Filled 2021-08-08: qty 2

## 2021-08-08 MED ORDER — LORAZEPAM 2 MG/ML IJ SOLN
4.0000 mg | Freq: Once | INTRAMUSCULAR | Status: AC
  Administered 2021-08-08: 4 mg via INTRAMUSCULAR
  Filled 2021-08-08: qty 2

## 2021-08-08 MED ORDER — CHLORPROMAZINE HCL 25 MG/ML IJ SOLN
50.0000 mg | Freq: Once | INTRAMUSCULAR | Status: AC
  Administered 2021-08-08: 50 mg via INTRAMUSCULAR
  Filled 2021-08-08: qty 2

## 2021-08-08 NOTE — ED Notes (Signed)
Pt awake, opens door, exits room and attempts to leave unit again. Pt looks lethargic from medication, worse communication than he had before sleeping. Escorted back to room by staff

## 2021-08-08 NOTE — ED Notes (Signed)
Pt dressed out by this RN and EDT Caitlyn  1 pair grey sweat pants 1 purple shirt 1 red and blue jersy 1 pair grey sneakers

## 2021-08-08 NOTE — ED Provider Notes (Signed)
-----------------------------------------   5:35 AM on 08/08/2021 -----------------------------------------   Blood pressure (!) 136/91, pulse 88, temperature (!) 97.3 F (36.3 C), temperature source Tympanic, resp. rate 20, height 5\' 8"  (1.727 m), weight 79.4 kg, SpO2 96 %.  The patient is calm and cooperative at this time.  Patient required IM Thorazine for agitation.  This was given for good effect patient slept overnight.  Awaiting disposition plan from Social Work team.   , MD 08/08/21 [phone]

## 2021-08-08 NOTE — ED Notes (Signed)
Pt continues to be asleep. Vs will be assess when pt is awake. This tech continues to monitor pt. Q15.

## 2021-08-08 NOTE — ED Notes (Signed)
Pt given phone 

## 2021-08-08 NOTE — ED Notes (Signed)
Pt threw phone out of room

## 2021-08-08 NOTE — ED Notes (Signed)
VOL  PENDING  PLACEMENT 

## 2021-08-08 NOTE — ED Notes (Signed)
Pt repeatedly coming out of room yelling, "phone." This RN explained to pt that he has used the phone multiple times and someone else is currently using it. Pt redirected back into room and continues to yell, phone."

## 2021-08-08 NOTE — ED Notes (Signed)
Patient coming out of room screaming at staff, "clothes, Clothes". Patient stripped scrubs off and was coming into hallway. Patient was redirected by this Clinical research associate and security. Order of 4 mg ativan IM obtained and given in right deltoid by this Clinical research associate.  Security standing by for safety of this Clinical research associate and for patient. Patient took medication with no issues.

## 2021-08-08 NOTE — Progress Notes (Signed)
   08/08/21 1600  Clinical Encounter Type  Visited With Patient  Visit Type Follow-up;Spiritual support;Social support  Referral From Other (Comment) (rounding)  Spiritual Encounters  Spiritual Needs Other (Comment) (general support)  Chaplain Burris greeted Pt and offered general support and words of encouragement. Chaplain read an excerpt from sacred text. Will continue to follow.

## 2021-08-08 NOTE — ED Notes (Signed)
EDT obtained pt's BP with diastolic number high. Pt refused for BP to be retaken

## 2021-08-08 NOTE — ED Notes (Addendum)
Charge RN at bedside to give pt morning medications. Pt compliant at this time

## 2021-08-08 NOTE — ED Notes (Signed)
Pt asleep at this time, unable to collect vitals. Will collect pt vitals once awake. 

## 2021-08-08 NOTE — TOC Progression Note (Signed)
Transition of Care [facility]) - Progression Note    Patient Details  Name: [name] MRN: [id number] Date of Birth: 10-13-1961  [facility]) CM/SW Contact  [name] Phone Number: [phone] 08/08/2021, 4:13 PM  Clinical Narrative:     CSW called for patient's mother /HCPOA [place] Deanda [phone] [facility]), voicemail is full.  CSW called [name] [phone], w/ [facility]/L[facility], he stated he had not heard back from Ms. Morrish about her meeting w/ the attorney about the patient's Medicaid.  [name] stated the patient receives about $1,700 per month from social security and disability and they would need to choose to stop receiving one of those to qualify for Medicaid.  [name] stated he would email Ms. Obey requesting an update and cc this CSW.  Expected Discharge Plan: Group Home Barriers to Discharge: Continued Medical Work up  Expected Discharge Plan and Services Expected Discharge Plan: Group Home In-house Referral: Clinical Social Work     Living arrangements for the past 2 months: Group Home                                       Social Determinants of Health (SDOH) Interventions    Readmission Risk Interventions No flowsheet data found.

## 2021-08-08 NOTE — ED Notes (Addendum)
Pt awake and out of room. Pt at nurses station placing arm under the glass reaching for objects. This RN attempting to redirect patient back to room. Pt uncooperative initially and continued yelling aloud. Pt pointing at wrist. RN provided pt with new patient arm band and redirected back to his room. Pt closed the door.

## 2021-08-09 DIAGNOSIS — F209 Schizophrenia, unspecified: Secondary | ICD-10-CM | POA: Diagnosis not present

## 2021-08-09 MED ORDER — TEMAZEPAM 7.5 MG PO CAPS
15.0000 mg | ORAL_CAPSULE | Freq: Every day | ORAL | Status: DC
  Administered 2021-08-09 – 2022-03-18 (×206): 15 mg via ORAL
  Filled 2021-08-09: qty 2
  Filled 2021-08-09 (×5): qty 1
  Filled 2021-08-09 (×2): qty 2
  Filled 2021-08-09 (×2): qty 1
  Filled 2021-08-09 (×3): qty 2
  Filled 2021-08-09 (×3): qty 1
  Filled 2021-08-09 (×2): qty 2
  Filled 2021-08-09: qty 1
  Filled 2021-08-09: qty 2
  Filled 2021-08-09 (×2): qty 1
  Filled 2021-08-09: qty 2
  Filled 2021-08-09: qty 1
  Filled 2021-08-09: qty 2
  Filled 2021-08-09: qty 1
  Filled 2021-08-09: qty 2
  Filled 2021-08-09: qty 1
  Filled 2021-08-09 (×5): qty 2
  Filled 2021-08-09: qty 1
  Filled 2021-08-09: qty 2
  Filled 2021-08-09: qty 1
  Filled 2021-08-09 (×3): qty 2
  Filled 2021-08-09: qty 1
  Filled 2021-08-09 (×2): qty 2
  Filled 2021-08-09 (×2): qty 1
  Filled 2021-08-09: qty 2
  Filled 2021-08-09 (×2): qty 1
  Filled 2021-08-09 (×2): qty 2
  Filled 2021-08-09: qty 1
  Filled 2021-08-09: qty 2
  Filled 2021-08-09: qty 1
  Filled 2021-08-09 (×3): qty 2
  Filled 2021-08-09: qty 1
  Filled 2021-08-09 (×3): qty 2
  Filled 2021-08-09 (×2): qty 1
  Filled 2021-08-09: qty 2
  Filled 2021-08-09 (×2): qty 1
  Filled 2021-08-09 (×3): qty 2
  Filled 2021-08-09 (×2): qty 1
  Filled 2021-08-09: qty 2
  Filled 2021-08-09 (×3): qty 1
  Filled 2021-08-09: qty 2
  Filled 2021-08-09: qty 1
  Filled 2021-08-09: qty 2
  Filled 2021-08-09: qty 1
  Filled 2021-08-09: qty 2
  Filled 2021-08-09: qty 1
  Filled 2021-08-09 (×4): qty 2
  Filled 2021-08-09: qty 1
  Filled 2021-08-09: qty 2
  Filled 2021-08-09 (×4): qty 1
  Filled 2021-08-09 (×2): qty 2
  Filled 2021-08-09 (×3): qty 1
  Filled 2021-08-09: qty 2
  Filled 2021-08-09 (×2): qty 1
  Filled 2021-08-09 (×4): qty 2
  Filled 2021-08-09: qty 1
  Filled 2021-08-09: qty 2
  Filled 2021-08-09 (×4): qty 1
  Filled 2021-08-09: qty 2
  Filled 2021-08-09 (×2): qty 1
  Filled 2021-08-09: qty 2
  Filled 2021-08-09 (×9): qty 1
  Filled 2021-08-09 (×4): qty 2
  Filled 2021-08-09: qty 1
  Filled 2021-08-09 (×2): qty 2
  Filled 2021-08-09 (×6): qty 1
  Filled 2021-08-09 (×3): qty 2
  Filled 2021-08-09 (×4): qty 1
  Filled 2021-08-09 (×5): qty 2
  Filled 2021-08-09 (×3): qty 1
  Filled 2021-08-09 (×2): qty 2
  Filled 2021-08-09: qty 1
  Filled 2021-08-09 (×2): qty 2
  Filled 2021-08-09: qty 1
  Filled 2021-08-09: qty 2
  Filled 2021-08-09: qty 1
  Filled 2021-08-09 (×2): qty 2
  Filled 2021-08-09 (×2): qty 1
  Filled 2021-08-09 (×2): qty 2
  Filled 2021-08-09: qty 1
  Filled 2021-08-09: qty 2
  Filled 2021-08-09 (×5): qty 1
  Filled 2021-08-09 (×2): qty 2
  Filled 2021-08-09 (×3): qty 1
  Filled 2021-08-09: qty 2
  Filled 2021-08-09: qty 1
  Filled 2021-08-09: qty 2
  Filled 2021-08-09 (×4): qty 1
  Filled 2021-08-09 (×2): qty 2
  Filled 2021-08-09 (×2): qty 1
  Filled 2021-08-09 (×2): qty 2
  Filled 2021-08-09: qty 1
  Filled 2021-08-09: qty 2
  Filled 2021-08-09 (×2): qty 1
  Filled 2021-08-09: qty 2
  Filled 2021-08-09: qty 1
  Filled 2021-08-09: qty 2
  Filled 2021-08-09: qty 1
  Filled 2021-08-09: qty 2
  Filled 2021-08-09 (×2): qty 1
  Filled 2021-08-09 (×9): qty 2
  Filled 2021-08-09 (×2): qty 1
  Filled 2021-08-09 (×2): qty 2
  Filled 2021-08-09: qty 1
  Filled 2021-08-09: qty 2
  Filled 2021-08-09: qty 1
  Filled 2021-08-09 (×2): qty 2
  Filled 2021-08-09: qty 1

## 2021-08-09 MED ORDER — ZIPRASIDONE MESYLATE 20 MG IM SOLR
20.0000 mg | Freq: Once | INTRAMUSCULAR | Status: DC
  Filled 2021-08-09: qty 20

## 2021-08-09 MED ORDER — DIVALPROEX SODIUM 500 MG PO DR TAB
500.0000 mg | DELAYED_RELEASE_TABLET | Freq: Two times a day (BID) | ORAL | Status: DC
  Administered 2021-08-09 – 2021-08-26 (×29): 500 mg via ORAL
  Filled 2021-08-09 (×35): qty 1

## 2021-08-09 NOTE — ED Provider Notes (Signed)
Emergency Medicine Observation Re-evaluation Note  Rael Yo is a 60 y.o. male, seen on rounds today.  Pt initially presented to the ED for complaints of Psychiatric Evaluation Currently, the patient is sleeping.  Physical Exam  BP (!) 132/99 (BP Location: Right Arm)   Pulse (!) 117   Temp 99.1 F (37.3 C) (Oral)   Resp 18   Ht 5\' 8"  (1.727 m)   Wt 79.4 kg   SpO2 98%   BMI 26.61 kg/m  Physical Exam Gen: No acute distress  Resp: Normal rise and fall of chest Neuro: Moving all four extremities Psych: Resting currently, calm and cooperative when awake    ED Course / MDM  EKG:EKG Interpretation  Date/Time:  Monday August 07 2021 20:08:04 EDT Ventricular Rate:  77 PR Interval:  172 QRS Duration: 114 QT Interval:  370 QTC Calculation: 418 R Axis:   -15 Text Interpretation: Normal sinus rhythm Normal ECG Confirmed by UNCONFIRMED, DOCTOR (03-30-1991), editor [id number], Tammy [phone]) on 08/08/2021 8:27:49 AM  I have reviewed the labs performed to date as well as medications administered while in observation.  Recent changes in the last 24 hours include no acute events.  Plan  Current plan is for social work disposition.  Adrain Nesbit is not under involuntary commitment.     Tariana Moldovan, [name], DO 08/09/21 [phone]

## 2021-08-09 NOTE — Consult Note (Signed)
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 male patient admitted with patient remains incoherent and unable to engage in conversation.  HPI: See previous notes.  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  Patient has been in the emergency room for a long period of time now because he has no place to go no place that will accept him.  Guardian unable to take care of him at home.  Patient has been referred initially to inpatient hospitals but was not receiving any acceptances.  At this point appears to be probably at his baseline.  We have not been able to find any place for him to live however because of his ongoing behavior problems.  Patient is loud much of the day.  Shouts nonsensical words.  Has not assaulted anyone but frequently tries to walk out of the hospital and needs constant redirection.  Past Psychiatric History: Very little detail except what his mother is provided that he has a history of chronic mental illness  Risk to Self:   Risk to Others:   Prior Inpatient Therapy:   Prior Outpatient Therapy:    Past Medical History: No past medical history on file.  Family History: No family history on file. Family Psychiatric  History: See  previous Social History:  Social History   Substance and Sexual Activity  Alcohol Use Not on file     Social History   Substance and Sexual Activity  Drug Use Not on file    Social History   Socioeconomic History   Marital status: Single    Spouse name: Not on file   Number of children: Not on file   Years of education: Not on file   Highest education level: Not on file  Occupational History   Not on file  Tobacco Use   Smoking status: Not on file   Smokeless tobacco: Not on file  Substance and Sexual Activity   Alcohol use: Not on file   Drug use: Not on file   Sexual activity: Not on file  Other Topics Concern   Not on file  Social History Narrative   Not on file   Social Determinants of Health   Financial Resource Strain: Not on file  Food Insecurity: Not on file  Transportation Needs: Not on file  Physical Activity: Not on file  Stress: Not on file  Social Connections: Not on file   Additional Social History:    Allergies:   Allergies  Allergen Reactions   Morphine And Related    Nsaids     Labs: No results found for this or any previous visit (from the past 48 hour(s)).  Current Facility-Administered Medications  Medication Dose Route Frequency Provider Last Rate Last Admin   benztropine (COGENTIN) tablet 1 mg  1 mg Oral BID Charm Rings, NP   1 mg at 08/09/21 1013  cholecalciferol (VITAMIN D3) tablet 1,000 Units  1,000 Units Oral Daily Charm Rings, NP   1,000 Units at 08/09/21 1013   divalproex (DEPAKOTE) DR tablet 500 mg  500 mg Oral Q12H Cashlynn Yearwood T, MD       famotidine (PEPCID) tablet 20 mg  20 mg Oral BID PRN Charm Rings, NP   20 mg at 08/02/21 6387   folic acid (FOLVITE) tablet 1 mg  1 mg Oral Daily Charm Rings, NP   1 mg at 08/09/21 1013   gabapentin (NEURONTIN) capsule 300 mg  300 mg Oral TID Charm Rings, NP   300 mg at 08/09/21 1013   haloperidol (HALDOL) tablet 10 mg  10 mg Oral TID Charm Rings, NP   10 mg at  08/09/21 1012   Or   haloperidol lactate (HALDOL) injection 5 mg  5 mg Intramuscular TID Charm Rings, NP   5 mg at 08/08/21 1718   LORazepam (ATIVAN) tablet 1 mg  1 mg Oral TID Charm Rings, NP   1 mg at 08/09/21 1013   multivitamin with minerals tablet 1 tablet  1 tablet Oral Daily Charm Rings, NP   1 tablet at 08/09/21 1013   paliperidone (INVEGA) 24 hr tablet 9 mg  9 mg Oral Q1400 Taneisha Fuson T, MD   9 mg at 08/08/21 1729   temazepam (RESTORIL) capsule 15 mg  15 mg Oral QHS Otelia Sergeant, RPH   15 mg at 08/08/21 2127   thiamine tablet 100 mg  100 mg Oral Daily Charm Rings, NP   100 mg at 08/09/21 1013   ziprasidone (GEODON) injection 20 mg  20 mg Intramuscular Once Gilles Chiquito, MD       Current Outpatient Medications  Medication Sig Dispense Refill   benztropine (COGENTIN) 1 MG tablet Take 1 mg by mouth daily.     cholecalciferol (VITAMIN D3) 25 MCG (1000 UNIT) tablet Take 1,000 Units by mouth daily.     famotidine (PEPCID) 20 MG tablet Take 20 mg by mouth 2 (two) times daily as needed for heartburn or indigestion.     folic acid (FOLVITE) 1 MG tablet Take 1 mg by mouth daily.     gabapentin (NEURONTIN) 300 MG capsule Take 300 mg by mouth 3 (three) times daily.     haloperidol (HALDOL) 5 MG tablet Take 5 mg by mouth 3 (three) times daily.     LORazepam (ATIVAN) 1 MG tablet Take 1 mg by mouth 3 (three) times daily.     Multiple Vitamin (MULTIVITAMIN) tablet Take 1 tablet by mouth daily.     paliperidone (INVEGA SUSTENNA) 234 MG/1.5ML SUSY injection Inject 234 mg into the muscle every 28 (twenty-eight) days.     thiamine (VITAMIN B-1) 100 MG tablet Take 100 mg by mouth daily.     traZODone (DESYREL) 150 MG tablet Take 150 mg by mouth at bedtime.      Musculoskeletal: Strength & Muscle Tone: within normal limits Gait & Station: normal Patient leans: N/A            Psychiatric Specialty Exam:  Presentation  General Appearance: Bizarre  Eye  Contact:Poor  Speech:Garbled  Speech Volume:Increased  Handedness:Right   Mood and Affect  Mood:Euphoric  Affect:Inappropriate; Congruent   Thought Process  Thought Processes:Disorganized  Descriptions of Associations:Loose  Orientation:Partial  Thought Content:Delusions; Illogical; Tangential  History of Schizophrenia/Schizoaffective disorder:Yes  Duration of Psychotic Symptoms:Greater than six months  Hallucinations:No data recorded  Ideas of Reference:Delusions  Suicidal Thoughts:No data recorded Homicidal Thoughts:No data recorded  Sensorium  Memory:Immediate Poor; Recent Poor; Remote Poor  Judgment:Impaired  Insight:-- (Unable to assess)   Executive Functions  Concentration:Poor  Attention Span:Poor  Recall:Poor  [employer] of Knowledge:Poor  Language:Poor   Psychomotor Activity  Psychomotor Activity: No data recorded  Assets  Assets:Housing; Resilience; Social Support; Talents/Skills   Sleep  Sleep: No data recorded  Physical Exam: Physical Exam Vitals and nursing note reviewed.  Constitutional:      Appearance: Normal appearance.  HENT:     Head: Normocephalic and atraumatic.     Mouth/Throat:     Pharynx: Oropharynx is clear.  Eyes:     Pupils: Pupils are equal, round, and reactive to light.  Cardiovascular:     Rate and Rhythm: Normal rate and regular rhythm.  Pulmonary:     Effort: Pulmonary effort is normal.     Breath sounds: Normal breath sounds.  Abdominal:     General: Abdomen is flat.     Palpations: Abdomen is soft.  Musculoskeletal:        General: Normal range of motion.  Skin:    General: Skin is warm and dry.  Neurological:     General: No focal deficit present.     Mental Status: He is alert. Mental status is at baseline.  Psychiatric:        Attention and Perception: He is inattentive.        Mood and Affect: Affect is labile and inappropriate.        Speech: He is noncommunicative.        Behavior:  Behavior is agitated.        Cognition and Memory: Cognition is impaired.   Review of Systems  Unable to perform ROS: Patient nonverbal  Blood pressure (!) 132/99, pulse (!) 117, temperature 99.1 F (37.3 C), temperature source Oral, resp. rate 18, height 5\' 8"  (1.727 m), weight 79.4 kg, SpO2 98 %. Body mass index is 26.61 kg/m.  Treatment Plan Summary: Medication management and Plan patient is already on haloperidol as well as injectable Invega and is getting oral Invega and despite all of this remains agitated and disorganized.  Plan had been that he was psychiatrically cleared and was to be referred for placement but after consulting with social work this seems very difficult as well in his current condition.  Today I am adding Depakote 500 mg twice a day to try and improve behavior control.  We can reassess the possibility of inpatient care at this point given that his behavior is prohibitive to placement.  Disposition:  see note  , MD 08/09/2021 2:49 PM

## 2021-08-09 NOTE — ED Notes (Signed)
VOL/Pending Placement 

## 2021-08-09 NOTE — ED Notes (Signed)
Meal given to pt.

## 2021-08-09 NOTE — ED Notes (Signed)
Given dinner tray

## 2021-08-09 NOTE — ED Notes (Signed)
Pt. Got drink and crackers and ice cream and sandwich tray.

## 2021-08-09 NOTE — ED Notes (Signed)
Pt given ice cream

## 2021-08-09 NOTE — ED Notes (Signed)
VOL, pending placement 

## 2021-08-09 NOTE — ED Notes (Signed)
Given lunch tray.

## 2021-08-09 NOTE — ED Notes (Signed)
Pt brought to B[facility]. Showed to room. Pt upset about new area. Pt sees RN hand someone a remote and pt attempts to grab remote from RN. Security at bedside. Geodon ordered but no needed at this time. Kina RN attempting to get pt to take oral meds.

## 2021-08-09 NOTE — ED Notes (Addendum)
Pt given juice

## 2021-08-10 NOTE — ED Notes (Signed)
Refused to allow staff to take vitals. Yelling "no, no, no"

## 2021-08-10 NOTE — ED Notes (Signed)
VOL, pending placement 

## 2021-08-10 NOTE — Progress Notes (Signed)
Pt standing in day room; calm, cooperative. Pt states "yes" when asked how he is feeling. Pt denies pain and SI/HI/AVH at this time; however, pt states "yes" when asked if he feels that his mind is playing tricks on him but pt denies seeing things that are not there or hearing voices in his head. Pt states "yeah" when asked if he slept good last night and if he is eating good. Pt answers "yes" when asked if he is feeling anxious, but he denies depression at this time. No acute distress noted.

## 2021-08-10 NOTE — Progress Notes (Signed)
Pt report received from Ally, RN. 

## 2021-08-11 MED ORDER — ZIPRASIDONE MESYLATE 20 MG IM SOLR
20.0000 mg | Freq: Once | INTRAMUSCULAR | Status: AC
  Administered 2021-08-11: 20 mg via INTRAMUSCULAR
  Filled 2021-08-11: qty 20

## 2021-08-11 MED ORDER — DIAZEPAM 5 MG/ML IJ SOLN
5.0000 mg | Freq: Four times a day (QID) | INTRAMUSCULAR | Status: DC | PRN
  Administered 2021-08-11 – 2021-10-05 (×18): 5 mg via INTRAMUSCULAR
  Filled 2021-08-11 (×20): qty 2

## 2021-08-11 NOTE — TOC Progression Note (Addendum)
Transition of Care [facility]) - Progression Note    Patient Details  Name: Dacotah Cabello MRN: [id number] Date of Birth: February 03, 1961  [facility] CM/SW Contact  [name] Phone Number: [phone] 08/11/2021, 2:48 PM  Clinical Narrative:     CSW spoke with Ms. [place] Fenoglio, HCPOA  [phone] w/ update on current placement search and Medicaid application update.  Ms. Baade stated she will need to purchase items for the patient since he does not have any personal items and find somewhere to store them. Ms. Savannah stated she will also contact DSS to speak to someone about disability.  Ms. Niblett stated she will update this CSW next week, once she has more information on Medicaid process.  CSW outreach to [employer] w/ question about disability and Medicaid.   Expected Discharge Plan: Group Home Barriers to Discharge: Continued Medical Work up  Expected Discharge Plan and Services Expected Discharge Plan: Group Home In-house Referral: Clinical Social Work     Living arrangements for the past 2 months: Group Home                                       Social Determinants of Health (SDOH) Interventions    Readmission Risk Interventions No flowsheet data found.

## 2021-08-11 NOTE — ED Notes (Signed)
Pt declined shower, declined new linens or clothing. Pt yelling "no, no, no" at staff. Declined vitals signs, pushed dinamap out of room. Pt refused medications, spitting them back out in cup.

## 2021-08-11 NOTE — ED Provider Notes (Signed)
Emergency Medicine Observation Re-evaluation Note  Adam [name] is a 60 y.o. male, seen on rounds today.  Pt initially presented to the ED for complaints of Psychiatric Evaluation Currently, the patient is resting comfortably.  Physical Exam  BP 130/87 (BP Location: Right Arm)   Pulse (!) 101   Temp 97.9 F (36.6 C) (Oral)   Resp 18   Ht 5\' 8"  (1.727 m)   Wt 79.4 kg   SpO2 97%   BMI 26.61 kg/m  Physical Exam Constitutional:      Appearance: He is not ill-appearing or toxic-appearing.  HENT:     Head: Atraumatic.  Cardiovascular:     Comments: Appears well perfused Pulmonary:     Effort: Pulmonary effort is normal.  Abdominal:     General: There is no distension.  Musculoskeletal:        General: No deformity.  Neurological:     General: No focal deficit present.     ED Course / MDM  EKG:EKG Interpretation  Date/Time:  Monday August 07 2021 20:08:04 EDT Ventricular Rate:  77 PR Interval:  172 QRS Duration: 114 QT Interval:  370 QTC Calculation: 418 R Axis:   -15 Text Interpretation: Normal sinus rhythm Normal ECG Confirmed by UNCONFIRMED, DOCTOR (03-30-1991), editor [id number], Tammy [phone]) on 08/08/2021 8:27:49 AM  I have reviewed the labs performed to date as well as medications administered while in observation.  Recent changes in the last 24 hours include none.  Plan  Current plan is for placement.  Girard Koontz is not under involuntary commitment.     [name], MD 08/11/21 [phone]

## 2021-08-11 NOTE — ED Notes (Signed)
Patient given phone to make call at this time

## 2021-08-11 NOTE — ED Notes (Signed)
Hourly rounding reveals patient in room. No complaints, stable, in no acute distress. Q15 minute rounds and monitoring via Security Cameras to continue. 

## 2021-08-11 NOTE — Progress Notes (Signed)
Pt sitting in room watching tv, took PO medications w/o incident. Cont to monitor as ordered

## 2021-08-11 NOTE — Progress Notes (Signed)
Pt accepted and ate 75% of Lunch tray.  Pt calm, watching tv.

## 2021-08-11 NOTE — Progress Notes (Signed)
Pt was becoming increasingly agitated, yelling, trying to open locked doors, and pacing.  On call notified and PRN ordered/administered w/o incident.  Staff sat with patient allowing time to deescalate. Cont to monitor for any changes in behaviors and continued safety

## 2021-08-11 NOTE — Progress Notes (Signed)
Pt A/Ox3, hospital dinner meal provided.  100% consumed.  Waste discarded appropriately   

## 2021-08-11 NOTE — ED Notes (Signed)
VOL  PENDING  PLACEMENT 

## 2021-08-11 NOTE — ED Notes (Signed)
Report to include Situation, Background, Assessment, and Recommendations received from Katie RN. Patient alert and oriented, warm and dry, in no acute distress. Patient denies SI, HI, AVH and pain. Patient made aware of Q15 minute rounds and security cameras for their safety. Patient instructed to come to me with needs or concerns.  

## 2021-08-11 NOTE — ED Notes (Signed)
Patient has a paper with "[place]" written on it ane he gets anxious and restless when he loses it. The paper is in his room.

## 2021-08-11 NOTE — ED Notes (Signed)
VOL/pending placement 

## 2021-08-12 MED ORDER — TRAZODONE HCL 50 MG PO TABS
50.0000 mg | ORAL_TABLET | Freq: Every day | ORAL | Status: DC
  Administered 2021-08-12 – 2021-08-25 (×12): 50 mg via ORAL
  Filled 2021-08-12 (×15): qty 1

## 2021-08-12 NOTE — ED Notes (Signed)
Hourly rounding reveals patient in room. No complaints, stable, in no acute distress. Q15 minute rounds and monitoring via Security Cameras to continue. 

## 2021-08-12 NOTE — ED Provider Notes (Signed)
Emergency Medicine Observation Re-evaluation Note  Adam [name] is a 60 y.o. male, seen on rounds today.  Pt initially presented to the ED for complaints of Psychiatric Evaluation Currently, the patient is resting comfortably.  Physical Exam  BP (!) 158/105 (BP Location: Right Arm)   Pulse (!) 102   Temp 98.1 F (36.7 C) (Oral)   Resp 17   Ht 5\' 8"  (1.727 m)   Wt 79.4 kg   SpO2 96%   BMI 26.61 kg/m  Physical Exam Constitutional:      Appearance: He is not ill-appearing or toxic-appearing.  HENT:     Head: Atraumatic.  Cardiovascular:     Comments: Appears well perfused Pulmonary:     Effort: Pulmonary effort is normal.  Abdominal:     General: There is no distension.  Musculoskeletal:        General: No deformity.  Neurological:     General: No focal deficit present.     ED Course / MDM  EKG:EKG Interpretation  Date/Time:  Monday August 07 2021 20:08:04 EDT Ventricular Rate:  77 PR Interval:  172 QRS Duration: 114 QT Interval:  370 QTC Calculation: 418 R Axis:   -15 Text Interpretation: Normal sinus rhythm Normal ECG Confirmed by UNCONFIRMED, DOCTOR (03-30-1991), editor [id number], Tammy [phone]) on 08/08/2021 8:27:49 AM  I have reviewed the labs performed to date as well as medications administered while in observation.  Recent changes in the last 24 hours include none.  Plan  Current plan is for placement.  Maddex Garlitz is not under involuntary commitment.     [name], MD 08/12/21 [phone]

## 2021-08-12 NOTE — ED Notes (Signed)
VS not taken, patient asleep 

## 2021-08-12 NOTE — ED Notes (Signed)
VOL, pending placement 

## 2021-08-13 MED ORDER — ZIPRASIDONE MESYLATE 20 MG IM SOLR
20.0000 mg | Freq: Once | INTRAMUSCULAR | Status: AC
  Administered 2021-08-13: 20 mg via INTRAMUSCULAR
  Filled 2021-08-13: qty 20

## 2021-08-13 MED ORDER — DIAZEPAM 5 MG/ML IJ SOLN
5.0000 mg | Freq: Once | INTRAMUSCULAR | Status: AC
  Administered 2021-08-13: 5 mg via INTRAVENOUS

## 2021-08-13 NOTE — ED Notes (Signed)
Patient sleeping, vitals signs not obtained.

## 2021-08-13 NOTE — ED Notes (Signed)
Pts breakfast tray at bedside, pt continues to sleep. Will obtain VS when pt awakens.

## 2021-08-13 NOTE — ED Notes (Signed)
Hourly rounding reveals patient in room. No complaints, stable, in no acute distress. Q15 minute rounds and monitoring via Security Cameras to continue. 

## 2021-08-13 NOTE — ED Notes (Signed)
Pt continues to sleep, will offer snack and obtain VS when awakens.

## 2021-08-13 NOTE — ED Notes (Signed)
Report to include Situation, Background, Assessment, and Recommendations received from Katie RN. Patient alert and oriented, warm and dry, in no acute distress. Patient denies SI, HI, AVH and pain. Patient made aware of Q15 minute rounds and security cameras for their safety. Patient instructed to come to me with needs or concerns.  

## 2021-08-13 NOTE — ED Notes (Signed)
Pt given dinner tray.

## 2021-08-13 NOTE — ED Notes (Signed)
Pt continued yelling and banging on the walls and glass.  MD notified and PRN administered.  Cont to monitor for changes in behaviors

## 2021-08-13 NOTE — ED Provider Notes (Signed)
-----------------------------------------   12:14 PM on 08/13/2021 ----------------------------------------- Patient has become agitated, yelling loudly banging on the glass.  Concern for patient's safety.  We will dose IM Geodon and Valium which has worked for the patient in the past.   Minna Antis, MD 08/13/21 1214

## 2021-08-13 NOTE — ED Notes (Signed)
Pt asleep, will attempt to obtain VS when awakens.

## 2021-08-13 NOTE — ED Notes (Signed)
Pt given lunch tray.

## 2021-08-13 NOTE — ED Provider Notes (Signed)
Emergency Medicine Observation Re-evaluation Note  Adam [name] is a 60 y.o. male, seen on rounds today.  Pt initially presented to the ED for complaints of Psychiatric Evaluation Currently, the patient is sitting on the side of the bed, no distress.  No acute events overnight.  Physical Exam  BP (!) 142/100 (BP Location: Right Arm)   Pulse 100   Temp 98.1 F (36.7 C) (Oral)   Resp 18   Ht 5\' 8"  (1.727 m)   Wt 79.4 kg   SpO2 98%   BMI 26.61 kg/m  Physical Exam General: Calm, sitting on the side of the bed.  Pleasant. Cardiac: Regular rate and rhythm around 90 bpm Lungs: Clear to auscultation bilaterally with a normal respiratory rate Psych: Calm cooperative  ED Course / MDM   No recent lab work for review  Plan  Current plan is for placement to an appropriate living facility once available.  Social work is working with the patient regarding this.  Adam [name] is not under involuntary commitment.     [name], MD 08/13/21 [phone]

## 2021-08-13 NOTE — ED Notes (Signed)
Pt awake and refusing VS at this time. Will attempt to try again later.

## 2021-08-14 NOTE — ED Notes (Signed)
Hourly rounding reveals patient in room. No complaints, stable, in no acute distress. Q15 minute rounds and monitoring via Security Cameras to continue. 

## 2021-08-14 NOTE — ED Notes (Signed)
Pt moved to bhu 7

## 2021-08-14 NOTE — ED Notes (Signed)
Lunch tray given. 

## 2021-08-14 NOTE — ED Provider Notes (Signed)
Emergency Medicine Observation Re-evaluation Note  Charan Prieto is a 60 y.o. male, seen on rounds today.  Pt initially presented to the ED for complaints of Psychiatric Evaluation  Currently, the patient is resting   Physical Exam  Blood pressure (!) 134/96, pulse (!) 101, temperature 97.8 F (36.6 C), temperature source Oral, resp. rate 18, height 5\' 8"  (1.727 m), weight 79.4 kg, SpO2 100 %.  Physical Exam General: No apparent distress Pulm: Normal WOB GI: soft and non tender Psych: calm cooperative      ED Course / MDM  Required IM geodon and valium yesterday at 12PM due to being a danger to self. will closer monitor to see if patient gets agitated again   Plan   Current plan is for placement to an appropriate living facility once available.  Social work is working with the patient regarding this.  Ulysses Alper is not under involuntary commitment.     [name], MD 08/14/21 [phone]

## 2021-08-14 NOTE — ED Notes (Signed)
Breakfast tray given. °

## 2021-08-14 NOTE — ED Notes (Signed)
VOL  PENDING  PLACEMENT 

## 2021-08-14 NOTE — ED Notes (Signed)
Staff have great difficulty understanding patient's needs.  Pt becoming frustrated, yelling,

## 2021-08-14 NOTE — ED Notes (Signed)
Pt used restroom ?

## 2021-08-14 NOTE — ED Notes (Signed)
Pt sitting in dayroom. 

## 2021-08-14 NOTE — ED Notes (Signed)
Dinner tray given

## 2021-08-14 NOTE — ED Notes (Signed)
3 containers of soap and 2 containers of lotion removed from patient's room.   Spill of water (drink?) also cleaned from floor.

## 2021-08-14 NOTE — ED Notes (Signed)
VS not taken, patient asleep 

## 2021-08-14 NOTE — ED Notes (Signed)
Gave pt a sprite. 

## 2021-08-14 NOTE — ED Notes (Signed)
Patient seen crawling to go into the minor section and when he reached the door, he tried to open the door. When he realized that the door is closed he went to his room. Continue to monitor.

## 2021-08-14 NOTE — ED Notes (Signed)
Resumed care from Derel Mcglasson b rn.  Pt in dayroom

## 2021-08-15 MED ORDER — DIAZEPAM 5 MG PO TABS
5.0000 mg | ORAL_TABLET | Freq: Once | ORAL | Status: AC
  Administered 2021-08-15: 5 mg via ORAL
  Filled 2021-08-15: qty 1

## 2021-08-15 NOTE — TOC Progression Note (Addendum)
Transition of Care [facility]) - Progression Note    Patient Details  Name: Adam [name] MRN: [id number] Date of Birth: 1961-04-17  [facility] of [facility]) CM/SW Contact  [name] Phone Number: [phone] 08/15/2021, 10:12 AM  Clinical Narrative:     CSW spoke with [name] [phone], w/ [facility], who stated he spoke with patient's mother /HCPOA Adam [name] [phone].  [name] stated he told [name] that he agreed with advise for her to familiarize herself with Disability and Medicaid criteria. [name] stated  he would contacvt his CSW once he had new updates. Expected Discharge Plan: Group Home Barriers to Discharge: Continued Medical Work up  Expected Discharge Plan and Services Expected Discharge Plan: Group Home In-house Referral: Clinical Social Work     Living arrangements for the past 2 months: Group Home                                       Social Determinants of Health (SDOH) Interventions    Readmission Risk Interventions No flowsheet data found.

## 2021-08-15 NOTE — ED Notes (Signed)
Patient observed with no unusual behavior or acute distress. Patient with no verbalized needs or c/o at this time.... will continue to monitor and follow up as needed. Security staff monitoring patient on camera system.  

## 2021-08-15 NOTE — ED Provider Notes (Signed)
Emergency Medicine Observation Re-evaluation Note  Adam [name] is a 60 y.o. male, seen on rounds today.  Pt initially presented to the ED for complaints of Psychiatric Evaluation Currently, the patient is resting comfortably.  Physical Exam  BP (!) 144/105 (BP Location: Left Arm)   Pulse 94   Temp 97.8 F (36.6 C) (Oral)   Resp 18   Ht 5\' 8"  (1.727 m)   Wt 79.4 kg   SpO2 100%   BMI 26.61 kg/m  Physical Exam General: No acute distress Cardiac: Well-perfused extremities Lungs: No respiratory distress Psych: Appropriate mood and affect ED Course / MDM  EKG:EKG Interpretation  Date/Time:  Monday August 07 2021 20:08:04 EDT Ventricular Rate:  77 PR Interval:  172 QRS Duration: 114 QT Interval:  370 QTC Calculation: 418 R Axis:   -15 Text Interpretation: Normal sinus rhythm Normal ECG Confirmed by UNCONFIRMED, DOCTOR (03-30-1992), editor [id number], Tammy [phone]) on 08/08/2021 8:27:49 AM  I have reviewed the labs performed to date as well as medications administered while in observation.  Recent changes in the last 24 hours include none.  Plan  Current plan is for psychiatric placement.  [name] is not under involuntary commitment.     Adam Sharps, MD 08/15/21 [phone]

## 2021-08-15 NOTE — ED Notes (Signed)
Pt resting in room.  Staff attempted to complete a.m. medication pass, pt refused.  B'fast tray given.  Will offer a.m. medications again at a late time

## 2021-08-15 NOTE — ED Notes (Signed)
Report received from Amy RN. Patient care assumed. Patient/RN introduction complete. Will continue to monitor.  

## 2021-08-15 NOTE — ED Notes (Signed)
Pt in room, calm and cooperative at this time.

## 2021-08-15 NOTE — ED Notes (Signed)
Pt out in dayroom walking around.

## 2021-08-15 NOTE — ED Notes (Signed)
VOL, pending placement 

## 2021-08-15 NOTE — ED Notes (Signed)
VOL/Pending Placement (CSW)

## 2021-08-16 MED ORDER — LISINOPRIL 5 MG PO TABS
5.0000 mg | ORAL_TABLET | Freq: Every day | ORAL | Status: DC
  Administered 2021-08-16 – 2021-11-19 (×81): 5 mg via ORAL
  Filled 2021-08-16 (×88): qty 1

## 2021-08-16 NOTE — ED Notes (Signed)
Pt offered yet declined snack.  Will cont to monitor

## 2021-08-16 NOTE — ED Notes (Signed)
Pt given night snack

## 2021-08-16 NOTE — ED Notes (Signed)
Refused vital signs. Attempted to place blood pressure cuff and pulse ox cord on patient and he jerks away and refuses.

## 2021-08-16 NOTE — ED Notes (Signed)
Pt given dinner  

## 2021-08-16 NOTE — ED Notes (Signed)
Refused snack during snack time.

## 2021-08-16 NOTE — ED Provider Notes (Signed)
Emergency Medicine Observation Re-evaluation Note  Adam [name] is a 60 y.o. male, seen on rounds today.  Pt initially presented to the ED for complaints of Psychiatric Evaluation   Physical Exam  BP 129/90 (BP Location: Left Arm)   Pulse 89   Temp 97.9 F (36.6 C) (Oral)   Resp 16   Ht 5\' 8"  (1.727 m)   Wt 79.4 kg   SpO2 97%   BMI 26.61 kg/m  Physical Exam General: Patient resting comfortably in bed Lungs: Patient in no respiratory distress Psych: Patient not combative  ED Course / MDM  EKG:EKG Interpretation  Date/Time:  Monday August 07 2021 20:08:04 EDT Ventricular Rate:  77 PR Interval:  172 QRS Duration: 114 QT Interval:  370 QTC Calculation: 418 R Axis:   -15 Text Interpretation: Normal sinus rhythm Normal ECG Confirmed by UNCONFIRMED, DOCTOR (03-30-1992), editor [id number], Tammy [phone]) on 08/08/2021 8:27:49 AM    Plan  Current plan is for psychiatric placement  Adam [name] is not under involuntary commitment.     [name], MD 08/16/21 [phone]

## 2021-08-16 NOTE — ED Notes (Signed)
Pt declines shower 

## 2021-08-16 NOTE — ED Notes (Signed)
Vol /pending placement 

## 2021-08-16 NOTE — ED Notes (Signed)
Breakfast with hydration services w/o incident.  A.M. medications given as well, pt tolerated well. Staff informed pt of new snack schedule pt verbalized understanding  

## 2021-08-16 NOTE — ED Notes (Signed)
Elevated BP relayed to psych Dr

## 2021-08-16 NOTE — ED Notes (Signed)
VOL/pending placement 

## 2021-08-16 NOTE — ED Notes (Signed)
Hospital dinner meal provided.  100% consumed, pt tolerated w/o complaints.  Waste discarded appropriately.  

## 2021-08-16 NOTE — ED Notes (Signed)
Hospital lunch meal provided.  100% consumed, pt tolerated w/o complaints.  Waste discarded appropriately.  

## 2021-08-16 NOTE — ED Notes (Signed)
Pt given water 

## 2021-08-17 NOTE — ED Notes (Signed)
Pt awake and sitting up eating some of his dinner meal.  Pt refusing meds

## 2021-08-17 NOTE — ED Notes (Signed)
Pt sitting in room.

## 2021-08-17 NOTE — ED Notes (Signed)
Pt received snacks.  

## 2021-08-17 NOTE — ED Notes (Signed)
Patient is up and yelling about getting a phone. He is not able to be redirected. PRN IM Valium 5mg  has been successfully administered. NAD. Will monitor for med effectiveness.

## 2021-08-17 NOTE — ED Notes (Signed)
Resumed care from [facility] .  Pt sleeping in his room

## 2021-08-17 NOTE — ED Provider Notes (Signed)
Emergency Medicine Observation Re-evaluation Note  Adam [name] is a 60 y.o. male, seen on rounds today.  Pt initially presented to the ED for complaints of Psychiatric Evaluation Currently, the patient is calm, resting.  Physical Exam  BP (!) 142/82 (BP Location: Left Arm)   Pulse 65   Temp 98.2 F (36.8 C) (Oral)   Resp 15   Ht 5\' 8"  (1.727 m)   Wt 79.4 kg   SpO2 100%   BMI 26.61 kg/m    ED Course / MDM  EKG:EKG Interpretation  Date/Time:  Monday August 07 2021 20:08:04 EDT Ventricular Rate:  77 PR Interval:  172 QRS Duration: 114 QT Interval:  370 QTC Calculation: 418 R Axis:   -15 Text Interpretation: Normal sinus rhythm Normal ECG Confirmed by UNCONFIRMED, DOCTOR (03-30-1992), editor [id number], Tammy [phone]) on 08/08/2021 8:27:49 AM  I have reviewed the labs performed to date as well as medications administered while in observation.  Recent changes in the last 24 hours include none.  Plan  Current plan is for SW disposition  Adam [name] is not under involuntary commitment.     [name], MD 08/17/21 [phone]

## 2021-08-17 NOTE — ED Notes (Signed)
VOL  PENDING  PLACEMENT 

## 2021-08-17 NOTE — ED Notes (Signed)
Pt continues to sleep.  Meal left in room

## 2021-08-17 NOTE — ED Notes (Signed)
Dinner tray given

## 2021-08-17 NOTE — ED Notes (Signed)

## 2021-08-17 NOTE — ED Notes (Signed)
Patient walking around unit screaming. Patient redirected multiple times by Clinical research associate. Writer has tried to explain it's bedtime and other patients are trying to sleep.

## 2021-08-17 NOTE — ED Notes (Signed)
VOL, pending placement 

## 2021-08-17 NOTE — ED Notes (Signed)
Pt in room, using phone.

## 2021-08-17 NOTE — ED Notes (Signed)
Pt sleeping. 

## 2021-08-17 NOTE — ED Notes (Signed)
Pt in the bathroom  

## 2021-08-18 NOTE — ED Notes (Signed)
Pt. Got his sandwich tray with a lemon-lime soda.

## 2021-08-18 NOTE — ED Notes (Signed)
This RN received call from 911 emergency hotline representative. Representative informed this RN that pt had called 911 and was yelling incoherently. This RN and security member in pts room to retrieve phone from pt. Pt initially reluctant to give phone back but after coaching pt did hand phone over to security member.

## 2021-08-18 NOTE — ED Notes (Signed)
Pt took four medications and appeared to swallow them with water, however, pt quickly walked into room and was seen (on camera) spitting medications out into hand and putting them under mattress. Pt then left room - Amber, RN went into room and retrieved medications and ensured there were no more medications under mattress or in bedding.

## 2021-08-18 NOTE — ED Notes (Signed)
Report to include Situation, Background, Assessment, and Recommendations received from Dorian RN. Patient alert and oriented, warm and dry, in no acute distress. Patient denies SI, HI, AVH and pain. Patient made aware of Q15 minute rounds and security cameras for their safety. Patient instructed to come to me with needs or concerns.  

## 2021-08-18 NOTE — ED Notes (Signed)
Hourly rounding reveals patient in room. No complaints, stable, in no acute distress. Q15 minute rounds and monitoring via Security Cameras to continue. 

## 2021-08-18 NOTE — ED Provider Notes (Signed)
Emergency Medicine Observation Re-evaluation Note  Adam [name] is a 60 y.o. male, seen on rounds today.  Pt initially presented to the ED for complaints of Psychiatric Evaluation Currently, the patient is sleeping.  Physical Exam  BP (!) 133/91 (BP Location: Left Arm)   Pulse 96   Temp 98.2 F (36.8 C) (Oral)   Resp 20   Ht 1.727 m (5\' 8" )   Wt 79.4 kg   SpO2 98%   BMI 26.61 kg/m  Physical Exam Deferred  ED Course / MDM  No acute events overnight  Plan  Social work is working on disposition  Kyal Arts is not under involuntary commitment.     [name], MD 08/18/21 [phone]

## 2021-08-18 NOTE — ED Notes (Signed)
Pt asleep, and not given snack.

## 2021-08-18 NOTE — ED Notes (Signed)
Pt. Refused to get night vitals done.

## 2021-08-18 NOTE — ED Notes (Signed)
Vols csw pending placement

## 2021-08-18 NOTE — ED Notes (Signed)
Again, pt appeared to be looking for medications under and around mattress - to the extent that pt completely inverted mattress. Pt growing increasingly agitated and does seem to be secretive when asked about reason for searching under mattress. Pt not receptive to any amount of therapeutic communication or redirection.

## 2021-08-18 NOTE — ED Notes (Signed)
Upon returning to room, pt was seen on live security footage looking under mattress in area that pt had previously placed medications. Pt continued to appear to be looking around and under mattress for medications.

## 2021-08-18 NOTE — ED Notes (Signed)
Patient sleeping in NAD. VS to be taken once awake. Will continue to monitor for there remainder of the shift.

## 2021-08-19 NOTE — ED Notes (Signed)
Pt given dinner tray.

## 2021-08-19 NOTE — ED Notes (Signed)
Hourly rounding reveals patient in room. No complaints, stable, in no acute distress. Q15 minute rounds and monitoring via Security Cameras to continue. 

## 2021-08-19 NOTE — ED Notes (Signed)
Pt given lunch tray.

## 2021-08-19 NOTE — ED Notes (Signed)
Patient asleep, VS not taken.

## 2021-08-19 NOTE — ED Notes (Signed)
Pt has not eaten any of his meals today.  Attempted to give evening meds and pt did not open his eyes.  EDP made aware.  Will attempt to obtain VS.

## 2021-08-19 NOTE — Progress Notes (Signed)
Patients mother contacted CSW back. CSW asked patients mother about her progress with her son's medicaid. Ms. Pry stated she is working on the spend down and plans to move the 4,000 dollars in her sons account but will need a statement from the bank. Ms. Suit stated she broke her toe so she is down at the moment. Ms. Saindon stated she is trying as quickly as she can to finish this process but stated she has moved 3 times in the past 10 months and doesn't know where everything is. Ms. Abdallah also stated that she hasn't heard from her son in  few days and is requesting an update from the doctor. CSW sent secure chat to RN with the request.

## 2021-08-19 NOTE — ED Notes (Signed)
VOL/pending placemnt 

## 2021-08-19 NOTE — ED Notes (Signed)
Attempted to get VS.  Pt yelled "NO!!"  EDP made aware.

## 2021-08-19 NOTE — ED Notes (Signed)
Pt given breakfast tray

## 2021-08-19 NOTE — Progress Notes (Signed)
CSW attempted to call patients mother. Mailbox was full and CSW was unable to leave a message. Text message was sent requesting a call back.

## 2021-08-19 NOTE — Progress Notes (Signed)
CSW contacted patients mother again. No answer and VM is still full. No response to text message.

## 2021-08-19 NOTE — ED Notes (Signed)
Report to include Situation, Background, Assessment, and Recommendations received from Amy RN. Patient alert and oriented, warm and dry, in no acute distress. Patient denies SI, HI, AVH and pain. Patient made aware of Q15 minute rounds and security cameras for their safety. Patient instructed to come to me with needs or concerns.  

## 2021-08-19 NOTE — ED Notes (Signed)
  This tech let pt try to get a shower. Pt went into shower but would not shower and refused any kind of assistance.

## 2021-08-19 NOTE — ED Notes (Signed)
Pt left 2 pills in med cup and refused to take them.  Ativan 1 mg wasted with Ardine Eng.

## 2021-08-19 NOTE — ED Notes (Signed)
Pt refused to open his mouth after taking his pills.  Pt could be heard chewing pills, but unable to verify if they were swallowed.  Pt then observed on camera lifting up his mattress.  RN returned to room with security and charge RN.  Pt lifted up mattress.  No mediation visible.

## 2021-08-19 NOTE — ED Notes (Signed)
Snack and beverage given. 

## 2021-08-19 NOTE — ED Notes (Signed)
Lisinopril held due to pt refusing VS.

## 2021-08-19 NOTE — ED Notes (Signed)
Pt asleep.  Medications will be give once awake.

## 2021-08-20 NOTE — ED Notes (Signed)
Hourly rounding reveals patient in room. No complaints, stable, in no acute distress. Q15 minute rounds and monitoring via Security Cameras to continue. 

## 2021-08-20 NOTE — ED Notes (Signed)
Pt given phone at this time. 

## 2021-08-20 NOTE — ED Notes (Signed)
This RN attempted to clean pt bedside, pt refused.

## 2021-08-20 NOTE — ED Notes (Addendum)
Dinner meal tray given.  

## 2021-08-20 NOTE — ED Provider Notes (Signed)
Emergency Medicine Observation Re-evaluation Note  Adam [name] is a 60 y.o. male, seen on rounds today.  Pt initially presented to the ED for complaints of Psychiatric Evaluation Currently, the patient is resting in bed and has no acute complaints.  Physical Exam  BP (!) 158/105 (BP Location: Left Arm)   Pulse (!) 110   Temp 98.5 F (36.9 C) (Oral)   Resp 20   Ht 5\' 8"  (1.727 m)   Wt 79.4 kg   SpO2 98%   BMI 26.61 kg/m  Physical Exam General: No distress. Cardiac: Good peripheral perfusion. Lungs: Normal respiratory effort. Psych: Calm and cooperative.  ED Course / MDM   I have reviewed the labs performed to date as well as medications administered while in observation.  There have been no significant changes in the last 24 hours.  Plan  Current plan is for social work disposition.  Adam [name] is not under involuntary commitment.     [name], MD 08/20/21 [phone]

## 2021-08-20 NOTE — ED Notes (Signed)
Pt currently sleeping during snack time.

## 2021-08-20 NOTE — ED Notes (Signed)
VS not taken, patient asleep 

## 2021-08-20 NOTE — ED Notes (Signed)
EDP Siadecki at bedside. 

## 2021-08-20 NOTE — ED Notes (Signed)
Report to include Situation, Background, Assessment, and Recommendations received from Ashley RN. Patient alert and oriented, warm and dry, in no acute distress. Patient denies SI, HI, AVH and pain. Patient made aware of Q15 minute rounds and security cameras for their safety. Patient instructed to come to me with needs or concerns.  

## 2021-08-20 NOTE — ED Notes (Signed)
Pt received their snack

## 2021-08-20 NOTE — ED Notes (Addendum)
Breakfast meal tray placed in the room, pt is currently sleeping.

## 2021-08-20 NOTE — ED Notes (Signed)
Lunch meal tray given.  

## 2021-08-20 NOTE — ED Notes (Signed)
Patient refused to take his HS medications at this time.

## 2021-08-20 NOTE — ED Notes (Signed)
Patient came to the nursing stating screaming "kids, kids". This Clinical research associate and staff unable to understand patient. Asked if he wants sheets, he said yes and kept screaming kids. This Clinical research associate offered him his HS medications and officer was present. Patient took his medication and went to his room.

## 2021-08-21 NOTE — ED Notes (Signed)
Patient banging on door. Patient redirected by Clinical research associate. Patient continues to hit door with hand.

## 2021-08-21 NOTE — ED Notes (Signed)
Hourly rounding reveals patient in room. No complaints, stable, in no acute distress. Q15 minute rounds and monitoring via Security Cameras to continue. 

## 2021-08-21 NOTE — ED Notes (Signed)
Patient given meal tray and beverage. 

## 2021-08-21 NOTE — ED Notes (Signed)
Vs not taken, patient asleep.

## 2021-08-21 NOTE — ED Notes (Signed)
Patient refused vital signs repeatedly.

## 2021-08-21 NOTE — ED Notes (Signed)
VOL/pending placement 

## 2021-08-21 NOTE — ED Notes (Signed)
Vol /pending placement 

## 2021-08-21 NOTE — ED Provider Notes (Signed)
Emergency Medicine Observation Re-evaluation Note  Adam [name] is a 60 y.o. male, seen on rounds today.  Physical Exam  BP (!) 131/100 (BP Location: Right Arm)   Pulse (!) 115   Temp 97.7 F (36.5 C) (Oral)   Resp 16   Ht 5\' 8"  (1.727 m)   Wt 79.4 kg   SpO2 98%   BMI 26.61 kg/m  Physical Exam General: Patient resting comfortably in bed Lungs: Patient in no respiratory distress Psych: Patient is not combative  ED Course / MDM  E Plan  Current plan is for social work placement  Adam [name] is not under involuntary commitment.     [name], MD 08/21/21 [phone]

## 2021-08-21 NOTE — ED Notes (Signed)
Pt received snack and drink 

## 2021-08-21 NOTE — ED Notes (Signed)
Patient received PRN IM medication per orders. EDP made aware.

## 2021-08-21 NOTE — ED Notes (Signed)
Hourly rounding reveals pt. in room.  No complaints at this time pt. stable, calm and in no acute distress.  Will continue Q 15-minute rounding and monitor via security cameras.    

## 2021-08-21 NOTE — ED Notes (Signed)
Patient screamed "NO" at nurse multiple times when asked if he would consent to having his vital signs taking. Reasoning and importance of vital signs explained to patient. Patient continued to refuse loudly.

## 2021-08-21 NOTE — ED Notes (Signed)
Patient up to nursing station and pointed at dinamap. RN asked patient if he was no consenting to vital signs. Patient stated yes. Patient walked back to room. RN followed patient back to room and obtained vital signs.

## 2021-08-21 NOTE — ED Notes (Signed)
Pt breakfast placed in room. Pt resting at this time, eyes closed RR even and unlabored.

## 2021-08-21 NOTE — ED Notes (Signed)
Patient agitated, screaming, walking from room to dayroom.

## 2021-08-22 ENCOUNTER — Emergency Department: Payer: Medicare HMO

## 2021-08-22 LAB — BASIC METABOLIC PANEL
Anion gap: 10 (ref 5–15)
BUN: 20 mg/dL (ref 8–23)
CO2: 26 mmol/L (ref 22–32)
Calcium: 9.2 mg/dL (ref 8.9–10.3)
Chloride: 105 mmol/L (ref 98–111)
Creatinine, Ser: 0.93 mg/dL (ref 0.61–1.24)
GFR, Estimated: >60 mL/min (ref >60)
Glucose, Bld: 100 mg/dL — ABNORMAL HIGH (ref 70–99)
Potassium: 4.3 mmol/L (ref 3.5–5.1)
Sodium: 141 mmol/L (ref 135–145)

## 2021-08-22 LAB — CBC WITH DIFFERENTIAL/PLATELET
Abs Immature Granulocytes: 0.04 10*3/uL (ref 0.00–0.07)
Basophils Absolute: 0.1 10*3/uL (ref 0.0–0.1)
Basophils Relative: 1 %
Eosinophils Absolute: 0.2 10*3/uL (ref 0.0–0.5)
Eosinophils Relative: 3 %
HCT: 40.3 % (ref 39.0–52.0)
Hemoglobin: 14.2 g/dL (ref 13.0–17.0)
Immature Granulocytes: 1 %
Lymphocytes Relative: 37 %
Lymphs Abs: 2.4 10*3/uL (ref 0.7–4.0)
MCH: 33.2 pg (ref 26.0–34.0)
MCHC: 35.2 g/dL (ref 30.0–36.0)
MCV: 94.2 fL (ref 80.0–100.0)
Monocytes Absolute: 0.7 10*3/uL (ref 0.1–1.0)
Monocytes Relative: 11 %
Neutro Abs: 3 10*3/uL (ref 1.7–7.7)
Neutrophils Relative %: 47 %
Platelets: 141 10*3/uL — ABNORMAL LOW (ref 150–400)
RBC: 4.28 MIL/uL (ref 4.22–5.81)
RDW: 12.5 % (ref 11.5–15.5)
WBC: 6.4 10*3/uL (ref 4.0–10.5)
nRBC: 0 % (ref 0.0–0.2)

## 2021-08-22 LAB — BLOOD GAS, VENOUS
Acid-Base Excess: 2 mmol/L (ref 0.0–2.0)
Bicarbonate: 27.3 mmol/L (ref 20.0–28.0)
O2 Saturation: 87 %
Patient temperature: 37
pCO2, Ven: 44 mmHg (ref 44.0–60.0)
pH, Ven: 7.4 (ref 7.250–7.430)
pO2, Ven: 53 mmHg — ABNORMAL HIGH (ref 32.0–45.0)

## 2021-08-22 LAB — URINALYSIS, ROUTINE W REFLEX MICROSCOPIC
Bilirubin Urine: NEGATIVE
Glucose, UA: NEGATIVE mg/dL
Hgb urine dipstick: NEGATIVE
Ketones, ur: NEGATIVE mg/dL
Leukocytes,Ua: NEGATIVE
Nitrite: NEGATIVE
Protein, ur: NEGATIVE mg/dL
Specific Gravity, Urine: 1.02 (ref 1.005–1.030)
pH: 7.5 (ref 5.0–8.0)

## 2021-08-22 LAB — RESP PANEL BY RT-PCR (FLU A&B, COVID) ARPGX2
Influenza A by PCR: NEGATIVE
Influenza B by PCR: NEGATIVE
SARS Coronavirus 2 by RT PCR: NEGATIVE

## 2021-08-22 NOTE — ED Provider Notes (Signed)
Emergency Medicine Observation Re-evaluation Note  Adam [name] is a 60 y.o. male, seen on rounds today.  Pt initially presented to the ED for complaints of Psychiatric Evaluation Currently, the patient is resting.  Physical Exam  BP (!) 130/94   Pulse (!) 107   Temp 98.4 F (36.9 C) (Oral)   Resp 17   Ht 5\' 8"  (1.727 m)   Wt 79.4 kg   SpO2 91%   BMI 26.61 kg/m  Physical Exam General: disheveled, NAD Cardiac: RRR Lungs: ctab, normal wob Psych: calm  ED Course / MDM  EKG:EKG Interpretation  Date/Time:  Monday August 07 2021 20:08:04 EDT Ventricular Rate:  77 PR Interval:  172 QRS Duration: 114 QT Interval:  370 QTC Calculation: 418 R Axis:   -15 Text Interpretation: Normal sinus rhythm Normal ECG Confirmed by UNCONFIRMED, DOCTOR (03-30-1992), editor [id number], Tammy [phone]) on 08/08/2021 8:27:49 AM  I have reviewed the labs performed to date as well as medications administered while in observation.  Recent changes in the last 24 hours include: increasing weakness per RN. No new focal deficits. H/o CVA.  Plan  Pt with increasing weakness today per nursing. Repeat CBC, BMP are unremarkable.  - Will check CXR, UA - Consider MR given h/o CVA - Dr. 10/08/2021 to f/u - If negative w/u, consider polypharmacy/med adjustments  Current plan is for SW placement. Adam [name] is not under involuntary commitment.     [name], MD 08/22/21 [phone]

## 2021-08-22 NOTE — ED Notes (Signed)
Patient mother [place] Called and given an update that no issues found on MRI.  Mother states she has not heard from patient in days.  This writer reassured mother I would pass along that she would like a phone call from patient tomorrow.

## 2021-08-22 NOTE — ED Notes (Signed)
Pt seems to be leaning towards his left more than normal, and right side continues to be slightly weak. MD made aware. CT ordered.

## 2021-08-22 NOTE — ED Notes (Signed)
Assisted pt with clean up and changing into clean scrubs.

## 2021-08-22 NOTE — ED Notes (Addendum)
Mother, [place] called and updated about pt most recent behavioral and ongoing medical workup. Requests to be called back with results of MRI and updated on patient progress [phone]

## 2021-08-22 NOTE — ED Notes (Signed)
Pt seems slower to move today. Takes pt a long time to get himself sitting up from the bed. Shuffles to bathroom. MD made aware.

## 2021-08-22 NOTE — ED Notes (Addendum)
Patient sitting on side of bed. No distress noted or observed. Will continue to monitor.

## 2021-08-22 NOTE — ED Notes (Signed)
Pt lying in bed with eyes open, smiling. Pt confirmed that he is having problems with leg weakness by answering "yes". Pt instructed to stay in bed and staff will assist him with getting up and out of bed; pt verbalized understanding by saying "ok". No acute distress noted.

## 2021-08-22 NOTE — ED Notes (Signed)
VOL  PENDING  PLACEMENT 

## 2021-08-22 NOTE — ED Notes (Signed)
Pt refused VS at this time, will attempt at a later time. Breakfast given to pt.

## 2021-08-22 NOTE — ED Notes (Signed)
Pt given lunch

## 2021-08-22 NOTE — TOC Progression Note (Signed)
Transition of Care [facility]) - Progression Note    Patient Details  Name: Adam [name] MRN: [id number] Date of Birth: [birth year]  Transition of Care [facility]) CM/SW Contact  Joseph Art, [place] Phone Number: 08/22/2021, 2:01 PM  Clinical Narrative:     CSW spoke with patient's mother /HCPOA Adam [name] [phone], requesting an updated on Medicaid application process.  Ms. Walck stated she is still working with the MCO/LME to rescind the patient's disability.  Ms. Maggio stated she will contact the social security office to discuss the patient's disability.  Ms. Kuss stated once she had an update she would update this CSW. No placement offers.  Expected Discharge Plan: Group Home Barriers to Discharge: Continued Medical Work up  Expected Discharge Plan and Services Expected Discharge Plan: Group Home In-house Referral: Clinical Social Work     Living arrangements for the past 2 months: Group Home                                       Social Determinants of Health (SDOH) Interventions    Readmission Risk Interventions No flowsheet data found.

## 2021-08-22 NOTE — ED Notes (Signed)
Pt needing increased help from security and staff to get up and walk around. Attempts to sit down but almost misses the bed/chair each time. Struggles to get up from the bed by himself. Moving pt back to main ER due to high fall risk.

## 2021-08-22 NOTE — ED Notes (Signed)
Dr Isaacs at bedside 

## 2021-08-22 NOTE — ED Notes (Signed)
Pt taken to CT with NT and security

## 2021-08-22 NOTE — ED Notes (Signed)
Urine obtained via in & out foley; pt tolerated collection well. No acute distress noted.

## 2021-08-23 LAB — VALPROIC ACID LEVEL: Valproic Acid Lvl: 65 ug/mL (ref 50.0–100.0)

## 2021-08-23 LAB — PROCALCITONIN: Procalcitonin: <0.1 ng/mL

## 2021-08-23 NOTE — ED Notes (Signed)
Patient in day room with steady gait walking to window then goes back to his room.

## 2021-08-23 NOTE — ED Notes (Signed)
Awoke pt to move to [facility]. Pt squeezes eyes shut and resistant against staff to sit up in bed. Pt eventually does sit up with encouragement and assistance from staff. Able to bear weight to stand and is moved into wheelchair. Pt moving hands toward his face and pulling hair back from his face.

## 2021-08-23 NOTE — ED Notes (Signed)
Patient given sandwich tray and soda due to patient sleeping earlier

## 2021-08-23 NOTE — ED Notes (Signed)
Pt. Is currently sleeping and was not offered a snack at this time.

## 2021-08-23 NOTE — ED Notes (Signed)
VOL/Pending Placement 

## 2021-08-23 NOTE — ED Notes (Signed)
Assumed care of patient, patient brought from main refusing to get out of chair and ambulate into room, patient assisted out of chair into room and has been laying in bed since arriva into the BHU. Safety maintained will continue to monitor.

## 2021-08-23 NOTE — ED Notes (Signed)
Scheduled med administered patient took po meds w/o difficulties. Went back to sleep.

## 2021-08-23 NOTE — ED Notes (Signed)
Pt transported from ED to [facility] via wheelchair with 1 staff and 1 security.  Pt needed minimal assistance with verbal encouragement to ambulate into and from chair.  Pt walked x1 assist to bed from [facility]

## 2021-08-23 NOTE — ED Provider Notes (Signed)
Emergency Medicine Observation Re-evaluation Note  Adam [name] is a 60 y.o. male, seen on rounds today.  Pt initially presented to the ED for complaints of Psychiatric Evaluation Currently, the patient is sleeping.  Physical Exam  BP (!) 133/99 (BP Location: Left Arm)   Pulse 93   Temp 98.4 F (36.9 C) (Oral)   Resp 18   Ht 5\' 8"  (1.727 m)   Wt 79.4 kg   SpO2 95%   BMI 26.61 kg/m  Physical Exam Gen: No acute distress  Resp: Normal rise and fall of chest Neuro: Moving all four extremities Psych: Resting currently, calm and cooperative when awake    ED Course / MDM  EKG:EKG Interpretation  Date/Time:  Monday August 07 2021 20:08:04 EDT Ventricular Rate:  77 PR Interval:  172 QRS Duration: 114 QT Interval:  370 QTC Calculation: 418 R Axis:   -15 Text Interpretation: Normal sinus rhythm Normal ECG Confirmed by UNCONFIRMED, DOCTOR (03-30-1992), editor [id number], Tammy [phone]) on 08/08/2021 8:27:49 AM  I have reviewed the labs performed to date as well as medications administered while in observation.  Recent changes in the last 24 hours include no acute events overnight.  Did have concerns yesterday for altered mental status but seems to be back to his baseline.  Labs, urine, MRI brain showed no acute abnormality.  Plan  Current plan is for social work disposition.  Ammiel Guiney is not under involuntary commitment.     Moriah Loughry, [name], DO 08/23/21 [phone]

## 2021-08-23 NOTE — ED Notes (Signed)
Pt easily awakened and with much encouragement, took medications and allowed vital signs to be taken. Able to sit up in bed with minimal assistance. Pt appears to be back at baseline mentality. Yelling "no, no, no".

## 2021-08-23 NOTE — ED Notes (Signed)
Pt noted to be standing in his room. Pt ambulated out to nurses station and back.   Ambulates independently.

## 2021-08-23 NOTE — ED Notes (Signed)
Hospital lunch meal provided.  Pt declined lunch as he was sleeping.  Meal in closed container left in pt room, will cont to monitor

## 2021-08-23 NOTE — ED Notes (Signed)
Patient out of bed to day room  with steady gait, went back into his room and laid in bed.

## 2021-08-23 NOTE — ED Notes (Signed)
Hospital  breakfast meal provided ; pt tolerated w/o complaints.  Waste discarded appropriately.  

## 2021-08-24 NOTE — ED Notes (Signed)
Pt brought his linen to nurse's station.  New linen given to patient.

## 2021-08-24 NOTE — ED Notes (Signed)
This nurse stayed with patient for 15 minutes in attempt to have patient swallow medications that he had placed in mouth. Pt immediately placed medication under his cheek and did not swallow. Refused to show in his mouth. Pt constantly screamed no throughout interaction. This nurse stayed with patient in attempt to either have patient swallow medications or to spit them out. Eventually medications either dissolved or were swallowed. This was after patient had made a mess in his room and dumped drink and food in protest. After meds were not longer in mouth, this nurse had security accompany in situation and room was cleared of all items not needed for comfort or care.

## 2021-08-24 NOTE — ED Notes (Signed)
Pt refused vitals to be taken when asked.

## 2021-08-24 NOTE — ED Notes (Signed)
Pt given lunch tray in room 5.

## 2021-08-24 NOTE — ED Notes (Signed)
VOL/Pending Placement 

## 2021-08-24 NOTE — ED Notes (Signed)
Pt's mother called to express concern over the change in pt's behavior.  RN will attempt to have pt call his mother once he is awake.  RN will also call if pt refuses his medications.

## 2021-08-24 NOTE — ED Notes (Signed)
Pt spoke with his mother while RN in room.  Pt still refuses to take his medications.  Continues to shout "NO" at staff.

## 2021-08-24 NOTE — ED Notes (Signed)
Pt finally agreeable to take medications.  NT present with RN.  Pt appeared to swallow all pills.  Pt opened his mouth and spoke after taking pills - no pills seen.

## 2021-08-24 NOTE — ED Provider Notes (Signed)
Emergency Medicine Observation Re-evaluation Note  Adam [name] is a 60 y.o. male, seen on rounds today.  Pt initially presented to the ED for complaints of Psychiatric Evaluation Currently, the patient is resting.  Physical Exam  BP 130/90 (BP Location: Right Arm)   Pulse 68   Temp 98.6 F (37 C) (Oral)   Resp 18   Ht 5\' 8"  (1.727 m)   Wt 79.4 kg   SpO2 96%   BMI 26.61 kg/m  Physical Exam General: NAD Cardiac: well perfused Lungs: unlabored Psych: calm  ED Course / MDM  EKG:EKG Interpretation  Date/Time:  Monday August 07 2021 20:08:04 EDT Ventricular Rate:  77 PR Interval:  172 QRS Duration: 114 QT Interval:  370 QTC Calculation: 418 R Axis:   -15 Text Interpretation: Normal sinus rhythm Normal ECG Confirmed by UNCONFIRMED, DOCTOR (03-30-1992), editor [id number], Adam [name] [phone]) on 08/08/2021 8:27:49 AM  I have reviewed the labs performed to date as well as medications administered while in observation.  Recent changes in the last 24 hours include none.  Plan  Current plan is for SW.  Adam [name] is not under involuntary commitment.     Adam Sharps, MD 08/24/21 [phone]

## 2021-08-24 NOTE — ED Notes (Signed)
Breakfast tray placed in room ?

## 2021-08-24 NOTE — ED Notes (Signed)
Pt given medications with NT present.  Pt opened his mouth and spoke after swallowing pills.  No pills visible in patient's mouth.

## 2021-08-24 NOTE — ED Notes (Signed)
Report received from Amy, RN including SBAR. Patient alert and oriented, warm and dry, in no acute distress. Patient denies SI, HI, AVH and pain. Patient made aware of Q15 minute rounds and security cameras for their safety. Patient instructed to come to this nurse with needs or concerns.  

## 2021-08-24 NOTE — ED Notes (Signed)
Pt refused his medications and closed room door.  Pt then followed RN into dayroom.  Pt took pill cup and drink, but never took the pills. RN will make pt's mother (guardian) aware.

## 2021-08-24 NOTE — ED Notes (Signed)
Night time snack provided - snack and drink of choice 

## 2021-08-24 NOTE — Progress Notes (Addendum)
ADDENDUM CSW called [facility] and received confirmation of the receipt of the fax.  [name], MSW, LCSW 08/24/2021 4:18 PM     CSW faxed 72 hours of notes for the patient to [facility].  CSW received confirmation on the success of the fax.    CSW will call to confirm receipt of the fax with [facility].  [name], MSW, LCSW 08/24/2021 2:10 PM

## 2021-08-25 MED ORDER — ZIPRASIDONE MESYLATE 20 MG IM SOLR
10.0000 mg | Freq: Once | INTRAMUSCULAR | Status: AC
  Administered 2021-08-25: 10 mg via INTRAMUSCULAR

## 2021-08-25 NOTE — ED Notes (Signed)
Unlocked bathroom door to allow patient to use restroom. Pt exited and bathroom door locked behind patient by staff.  

## 2021-08-25 NOTE — ED Notes (Signed)
VOL  PENDING  PLACEMENT 

## 2021-08-25 NOTE — ED Notes (Signed)
Pt medication given.  Pt has started trying to chew all medications. Staff offered applesauce while pt was chewing to aid in swallowing.  Pt took all medications w/o any complications.  Cont to monitor as ordered

## 2021-08-25 NOTE — ED Notes (Signed)
Pt takes injection willingly, security is present on unit for safety. Will continue to monitor.

## 2021-08-25 NOTE — ED Notes (Signed)
Patient is In day room, pt continues to yell despite being asked to stop, pt is causing a disruption of the unit causing other patients to react. Pt is tearing things off of the door that he can (papers), attempting to open all doors, walking through unit pouring drink on the floor. Pt has PRN order for anxiety, pt unable to express emotions due to past medical history, anxiety is inferred by this nurse. Will prepare IM PRN as ordered, security has been notified of planned medication admin

## 2021-08-25 NOTE — ED Notes (Signed)
Pt is hallucinating in the day room currently. Bent over at the floor, seems to be making motions like there is fire on the floor. Pt  has just attempted to break open door to other part of unit causing another patient to become upset as this is not the first time. Pt was redirected away from area and he returned to sit on chair in day room

## 2021-08-25 NOTE — ED Notes (Signed)
VOL, pending placement 

## 2021-08-25 NOTE — ED Notes (Signed)
Pt attempts to go back to door to break open that has been disrupting and causing other patient to escalate. Pt is redirected and not allowed to go any closer to door, pt returns to chair in day room. Pt is unsteady on feet and showing obvious signs of tiredness but continues to refuse to go to room.

## 2021-08-25 NOTE — ED Notes (Signed)
EDP notified of pt high BP. No new orders at this time.

## 2021-08-25 NOTE — ED Notes (Signed)
Hourly rounding reveals patient in room. No complaints, stable, in no acute distress. Q15 minute rounds and monitoring via Security Cameras to continue. 

## 2021-08-25 NOTE — ED Notes (Signed)
Pt is again trying to get to another area. Pt so unsteady with ambulation that he leans against wall to not fall. Pt is assisted back to room for safety purposes. Pt continues to yell out

## 2021-08-25 NOTE — ED Notes (Signed)
Meal tray given to patient.

## 2021-08-25 NOTE — ED Provider Notes (Signed)
Emergency Medicine Observation Re-evaluation Note  Adam [name] is a 60 y.o. male, seen on rounds today.  Pt initially presented to the ED for complaints of Psychiatric Evaluation Currently, the patient is sleeping, patient received Valium last night  Physical Exam  BP (!) 179/105 (BP Location: Left Arm)   Pulse (!) 102   Temp 98.1 F (36.7 C) (Axillary)   Resp 18   Ht 1.727 m (5\' 8" )   Wt 79.4 kg   SpO2 100%   BMI 26.61 kg/m  Physical Exam General: Sleeping Cardiac: Well perfused Lungs: No increased work of breathing   ED Course / MDM    Plan  Pending social work placement  Adam [name] is not under involuntary commitment.     [name], MD 08/25/21 [phone]

## 2021-08-25 NOTE — ED Notes (Signed)
Pt has currently remained sitting in the bed and not gotten up since medication administration. Pt is calming and not having verbal outburts

## 2021-08-25 NOTE — ED Notes (Signed)
Patient seen to be easing self to the ground for the same seated position he has been in for the last 1.5 hours. Security informed this nurse that patient was going to the floor. When this nurse arrived to room, pt was crawling on the floor, had turned to go down side of bed that he uses to get into bed. Security states that patient was gradually easing self to floor, leaning over slowly without a fall. Pt does not show sign of fall as he has been seen several times doing the same motion with hands to ground and back up. Pt was assisted to bed by this nurse and Georgiann Hahn, Jeremy Johann. Will continue to monitor.

## 2021-08-25 NOTE — ED Notes (Signed)
Patient provided snack at appropriate snack time.  Pt consumed 100% of snack provided, tolerated well w/o complaints   Trash disposted of appropriately by patient.  

## 2021-08-25 NOTE — ED Notes (Signed)
Report to include Situation, Background, Assessment, and Recommendations received from Katie RN. Patient alert and oriented, warm and dry, in no acute distress. Patient denies SI, HI, AVH and pain. Patient made aware of Q15 minute rounds and security cameras for their safety. Patient instructed to come to me with needs or concerns.  

## 2021-08-25 NOTE — ED Notes (Signed)
Encouraged patient to tidy room, provided trash can for patient to throw away any trash in patient room with staff supervision.  

## 2021-08-25 NOTE — ED Notes (Signed)
Pt receives IM injection without resistance. Pt continues to be disruptive in unit and requires escort back to room. Pt escorted by security to room via STARR appropriate escort. Pt once in room continues with loudness and yelling, does attempt to kick at one security guard without success. Pt in room and has calmed with behaviors, nurse and security exit area.

## 2021-08-25 NOTE — ED Notes (Signed)
Pt out and ambulates to day room, pt then walks to end of hallway and turns around, starts to walk back to day room and starts to look into other patients rooms and then starts to open other patient door and start to go into room. This nurse, Georgiann Hahn, Tech, and [place], Security enter unit again as soon as patient starts to make movement to door. Pt has to be stopped by this nurse while entering into other patient room and escorted back to his room. Pt is yelling and agitated. Dr. Don Perking is called by this nurse to inform of situation and events of night, orders to be placed for further medication.

## 2021-08-25 NOTE — ED Notes (Signed)
Patient sleeping. Meal tray placed at bedside.

## 2021-08-26 LAB — CBG MONITORING, ED: Glucose-Capillary: 106 mg/dL — ABNORMAL HIGH (ref 70–99)

## 2021-08-26 MED ORDER — BENZTROPINE MESYLATE 1 MG PO TABS
0.5000 mg | ORAL_TABLET | Freq: Two times a day (BID) | ORAL | Status: DC
  Administered 2021-08-27 – 2022-03-19 (×387): 0.5 mg via ORAL
  Filled 2021-08-26 (×409): qty 1

## 2021-08-26 MED ORDER — DIVALPROEX SODIUM 125 MG PO CSDR
500.0000 mg | DELAYED_RELEASE_CAPSULE | Freq: Two times a day (BID) | ORAL | Status: DC
  Administered 2021-08-27 – 2021-09-09 (×22): 500 mg via ORAL
  Filled 2021-08-26 (×34): qty 4

## 2021-08-26 MED ORDER — HALOPERIDOL LACTATE 5 MG/ML IJ SOLN
5.0000 mg | Freq: Three times a day (TID) | INTRAMUSCULAR | Status: DC
  Filled 2021-08-26: qty 1

## 2021-08-26 MED ORDER — GABAPENTIN 600 MG PO TABS
300.0000 mg | ORAL_TABLET | Freq: Three times a day (TID) | ORAL | Status: DC
  Administered 2021-08-27 – 2021-11-07 (×201): 300 mg via ORAL
  Filled 2021-08-26 (×210): qty 1

## 2021-08-26 MED ORDER — HALOPERIDOL 5 MG PO TABS
5.0000 mg | ORAL_TABLET | Freq: Three times a day (TID) | ORAL | Status: DC
  Administered 2021-08-27: 5 mg via ORAL
  Filled 2021-08-26: qty 1

## 2021-08-26 MED ORDER — PALIPERIDONE ER 3 MG PO TB24
6.0000 mg | ORAL_TABLET | Freq: Every day | ORAL | Status: DC
  Administered 2021-08-27 – 2021-11-26 (×84): 6 mg via ORAL
  Filled 2021-08-26 (×92): qty 2

## 2021-08-26 NOTE — ED Notes (Signed)
Asher Muir, NP to reassess pt's meds for polypharmacy affecting weakness/AMS.

## 2021-08-26 NOTE — ED Notes (Signed)
Po fluids provided, pt sipping on po fluids.

## 2021-08-26 NOTE — ED Notes (Signed)
Pt noted to be voluntarily sitting on floor

## 2021-08-26 NOTE — ED Notes (Signed)
Pt had to be lifted with x2 assist into wheelchair. Taken to [employer] with Joyice Faster, NT and Engineer, materials.

## 2021-08-26 NOTE — ED Notes (Addendum)
Pt continues to voluntarily lay on floor. Can barely get in bed when he chooses to. Has not left room or walked all day today. Has urinated on floor beside bed. Spoke with [employer] about moving pt due to fall risk. Will continue to monitor.

## 2021-08-26 NOTE — ED Notes (Signed)
Spoke with Florentina Addison, RN about moving pt back to quad. Pt to be moved to room 20. Charge RN also aware.

## 2021-08-26 NOTE — ED Notes (Signed)
Dr Susette Racer at bedside to assess pt

## 2021-08-26 NOTE — ED Notes (Signed)
On rounding, offered toileting to pt. Pt moves arms up to RN as if to have RN assist him out of bed. Attempted to help pt out of bed to see if pt needed to urinate/toilet and pt began to hit RN softly and lie back down in bed. Pt then began to sing. Skin normal color warm and dry. Resps unlabored, no cough noted.

## 2021-08-26 NOTE — ED Notes (Signed)
Hourly rounding reveals patient in room. No complaints, stable, in no acute distress. Q15 minute rounds and monitoring via Security Cameras to continue. 

## 2021-08-26 NOTE — ED Notes (Signed)
VS not taken, patient asleep 

## 2021-08-26 NOTE — ED Notes (Signed)
Luch tray given. No other needs found at this moment.  

## 2021-08-26 NOTE — ED Provider Notes (Signed)
-----------------------------------------   10:52 PM on 08/26/2021 ----------------------------------------- Patient had a fall in the room onto his buttocks.  Tech was in the room with the patient, no head injury.  I have since evaluated the patient, no traumatic findings.   Minna Antis, MD 08/26/21 2252

## 2021-08-26 NOTE — ED Notes (Signed)
VOL/Pending Placement 

## 2021-08-26 NOTE — ED Notes (Signed)
Hospital meal provided.  0% consumed, pt tolerated w/o complaints.  Waste discarded appropriately.

## 2021-08-26 NOTE — ED Notes (Signed)
Pt chews medications and swallows with water.

## 2021-08-26 NOTE — ED Notes (Signed)
Pt arrived from [facility] with staff.  Pt nonverbal, was assist x2 into bed.  Pt assessed by staff and resting comfortably, will cont to monitor as ordered

## 2021-08-26 NOTE — ED Notes (Signed)
Pt continues to be asleep. 

## 2021-08-26 NOTE — ED Notes (Signed)
Pt cleansed of large amount of incontinent urine, new clothes provided, bed cleansed and new sheets provided. Assist of staff x3 to change and cleanse pt needed due to fall risk. Bed in low and locked position.

## 2021-08-26 NOTE — ED Notes (Signed)
Pt requiring extensive assistance to get off floor. Laughing to himself. Unable to help with his legs much at all. Placed back on bed with Joyice Faster, NT and Engineer, materials.

## 2021-08-26 NOTE — ED Notes (Addendum)
This tech attempted to encourage pt to get up from the floor. Pt refused and continues lying down on the floor.

## 2021-08-26 NOTE — ED Notes (Addendum)
Pt provided with juice, pt consumed small amount before dumping in bed and on himself. Pt cleansed, new clothes applied, new sheets provided. Pt it total assist at this time.

## 2021-08-26 NOTE — ED Notes (Signed)
Pt singing in room

## 2021-08-26 NOTE — ED Notes (Signed)
Pt found to not be eating breakfast, but instead tearing up styrofoam tray while sitting on floor.

## 2021-08-26 NOTE — ED Notes (Signed)
Pt fell in room on buttocks when ed tech in room at 2230. Pt assisted back to bed by this RN, no injury noted. MD paduchowski notified, charge RN dawn notified. Bed in low and locked position. Toileting needs addressed. Pt is not redirectable, non skid footwear on feet.

## 2021-08-26 NOTE — ED Notes (Signed)
Po fluids and food offered to pt and pt declined, warm blankets provided. Pt is wearing glasses, singing along to tv.

## 2021-08-26 NOTE — ED Notes (Signed)
This tech and Shanda Bumps, Charity fundraiser provided pt with new linen sheets. Trash taken out. Pt sitting on the side of the bed and eating breakfast at this moment. No other needs found.

## 2021-08-26 NOTE — ED Provider Notes (Signed)
-----------------------------------------   10:30 PM on 08/26/2021 ----------------------------------------- Patient is now awake and acting back to his normal self, singing loudly in the emergency department.   Minna Antis, MD 08/26/21 2230

## 2021-08-26 NOTE — ED Provider Notes (Signed)
Emergency Medicine Observation Re-evaluation Note  Adam [name] is a 60 y.o. male, seen on rounds today.  Pt initially presented to the ED for complaints of Psychiatric Evaluation Currently, the patient is asleep, rather somnolent.  Physical Exam  BP 97/76 (BP Location: Left Arm)   Pulse 96   Temp 98.6 F (37 C) (Oral)   Resp 20   Ht 5\' 8"  (1.727 m)   Wt 79.4 kg   SpO2 99%   BMI 26.61 kg/m  Physical Exam General: Patient laying on the bed, he is snoring, requires sternal rub to wake Lungs: Respirations are even and unlabored Psych: No agitation, patient is somnolent  ED Course / MDM  EKG: I have reviewed the labs performed to date as well as medications administered while in observation.  Recent changes in the last 24 hours include no changes, per nursing patient has been more somnolent over the last several days.  He had previously not been taking all of his medications and is now getting them all so this may be secondary to polypharmacy.  Plan  Current plan is for transfer over to main ED given he is at significant falls risk.  I reviewed the recent work-up from 4 days ago due to altered mental status which includes a negative MRI and reassuring blood work.  Do not feel that additional work-up is necessary at this time.  I suspect that his mental status may be due to polypharmacy.  We will ask psych to adjust his medication.  We will continue to monitor.  Adam [name] is not under involuntary commitment.     [name], MD 08/26/21 [phone]

## 2021-08-26 NOTE — ED Notes (Signed)
pt asleep. VS will be assess when pt is awake. 

## 2021-08-26 NOTE — ED Notes (Signed)
Breakfast tray given. VS assessed. No other needs found at this moment. 

## 2021-08-27 MED ORDER — HALOPERIDOL LACTATE 5 MG/ML IJ SOLN
5.0000 mg | Freq: Three times a day (TID) | INTRAMUSCULAR | Status: DC
  Administered 2021-08-27 – 2021-11-27 (×17): 5 mg via INTRAMUSCULAR
  Filled 2021-08-27 (×33): qty 1

## 2021-08-27 MED ORDER — HALOPERIDOL 5 MG PO TABS
5.0000 mg | ORAL_TABLET | Freq: Once | ORAL | Status: AC
  Administered 2021-08-27: 5 mg via ORAL
  Filled 2021-08-27: qty 1

## 2021-08-27 MED ORDER — HALOPERIDOL 5 MG PO TABS
7.0000 mg | ORAL_TABLET | Freq: Three times a day (TID) | ORAL | Status: DC
  Administered 2021-08-29 – 2021-11-24 (×229): 7 mg via ORAL
  Administered 2021-11-26: 16:00:00 2 mg via ORAL
  Filled 2021-08-27 (×13): qty 1
  Filled 2021-08-27: qty 4
  Filled 2021-08-27 (×3): qty 1
  Filled 2021-08-27: qty 4
  Filled 2021-08-27 (×2): qty 1
  Filled 2021-08-27: qty 4
  Filled 2021-08-27 (×3): qty 1
  Filled 2021-08-27: qty 4
  Filled 2021-08-27 (×2): qty 1
  Filled 2021-08-27: qty 4
  Filled 2021-08-27 (×5): qty 1
  Filled 2021-08-27: qty 4
  Filled 2021-08-27 (×2): qty 1
  Filled 2021-08-27: qty 4
  Filled 2021-08-27 (×2): qty 1
  Filled 2021-08-27: qty 4
  Filled 2021-08-27 (×9): qty 1
  Filled 2021-08-27 (×2): qty 4
  Filled 2021-08-27 (×3): qty 1
  Filled 2021-08-27 (×2): qty 4
  Filled 2021-08-27: qty 1
  Filled 2021-08-27: qty 4
  Filled 2021-08-27 (×4): qty 1
  Filled 2021-08-27: qty 4
  Filled 2021-08-27 (×10): qty 1
  Filled 2021-08-27: qty 4
  Filled 2021-08-27: qty 1
  Filled 2021-08-27: qty 4
  Filled 2021-08-27 (×5): qty 1
  Filled 2021-08-27: qty 4
  Filled 2021-08-27: qty 1
  Filled 2021-08-27: qty 4
  Filled 2021-08-27 (×13): qty 1
  Filled 2021-08-27 (×2): qty 4
  Filled 2021-08-27 (×2): qty 1
  Filled 2021-08-27: qty 4
  Filled 2021-08-27 (×2): qty 1
  Filled 2021-08-27: qty 4
  Filled 2021-08-27 (×15): qty 1
  Filled 2021-08-27 (×4): qty 4
  Filled 2021-08-27: qty 1
  Filled 2021-08-27: qty 4
  Filled 2021-08-27 (×2): qty 1
  Filled 2021-08-27 (×2): qty 4
  Filled 2021-08-27 (×3): qty 1
  Filled 2021-08-27 (×2): qty 4
  Filled 2021-08-27: qty 1
  Filled 2021-08-27: qty 4
  Filled 2021-08-27 (×17): qty 1
  Filled 2021-08-27: qty 4
  Filled 2021-08-27: qty 1
  Filled 2021-08-27: qty 4
  Filled 2021-08-27 (×5): qty 1
  Filled 2021-08-27 (×2): qty 4
  Filled 2021-08-27 (×5): qty 1
  Filled 2021-08-27: qty 4
  Filled 2021-08-27 (×6): qty 1
  Filled 2021-08-27: qty 4
  Filled 2021-08-27: qty 1
  Filled 2021-08-27: qty 4
  Filled 2021-08-27: qty 1
  Filled 2021-08-27 (×2): qty 4
  Filled 2021-08-27 (×4): qty 1
  Filled 2021-08-27: qty 4
  Filled 2021-08-27 (×2): qty 1
  Filled 2021-08-27: qty 4
  Filled 2021-08-27 (×7): qty 1
  Filled 2021-08-27: qty 4
  Filled 2021-08-27 (×6): qty 1
  Filled 2021-08-27: qty 4
  Filled 2021-08-27 (×4): qty 1
  Filled 2021-08-27 (×2): qty 4
  Filled 2021-08-27 (×4): qty 1
  Filled 2021-08-27 (×2): qty 4
  Filled 2021-08-27 (×4): qty 1
  Filled 2021-08-27: qty 4
  Filled 2021-08-27 (×15): qty 1
  Filled 2021-08-27 (×2): qty 4
  Filled 2021-08-27: qty 1
  Filled 2021-08-27: qty 4
  Filled 2021-08-27: qty 1
  Filled 2021-08-27: qty 4
  Filled 2021-08-27 (×3): qty 1
  Filled 2021-08-27 (×2): qty 4
  Filled 2021-08-27 (×3): qty 1
  Filled 2021-08-27 (×3): qty 4
  Filled 2021-08-27 (×4): qty 1
  Filled 2021-08-27: qty 4
  Filled 2021-08-27 (×4): qty 1
  Filled 2021-08-27 (×2): qty 4

## 2021-08-27 MED ORDER — LORAZEPAM 1 MG PO TABS
1.0000 mg | ORAL_TABLET | Freq: Once | ORAL | Status: AC
  Administered 2021-08-27: 1 mg via ORAL
  Filled 2021-08-27: qty 1

## 2021-08-27 MED ORDER — ZIPRASIDONE MESYLATE 20 MG IM SOLR
20.0000 mg | Freq: Once | INTRAMUSCULAR | Status: AC
  Administered 2021-08-27: 20 mg via INTRAMUSCULAR
  Filled 2021-08-27: qty 20

## 2021-08-27 NOTE — ED Notes (Signed)
Pt sitting on floor in hallway, refuses to get up and get back into room. Pt is not yelling, will allow to sit in hallway as long as he does not disturb other pts.

## 2021-08-27 NOTE — ED Notes (Signed)
Hospital meal provided.  100% consumed, pt tolerated w/o complaints.  Waste discarded appropriately.   

## 2021-08-27 NOTE — ED Notes (Signed)
Pt ambulated with out assistive device into hallway, sat in chair and allowed staff to take vitals

## 2021-08-27 NOTE — ED Notes (Signed)
Pt up in room, attempting to ambulate and yelling. Kat and this RN at side, pt attempted to throw self on floor, caught by kat, ed tech before injury occurred. Pt assisted back to bed by staff. Snack provided to pt.

## 2021-08-27 NOTE — ED Provider Notes (Signed)
Patient moved back to main earlier in the day due to oversedation and fall. Evening meds were help. Patient has been agitated, walking, screaming, singing for hours now and will not calm down or sleep. No signs of sedation at this time. Will give his evening dose of haldol and ativan.   Don Perking, [place], MD 08/27/21 [phone]

## 2021-08-27 NOTE — ED Notes (Signed)
Hospital breakfast meal provided.  100% consumed, pt tolerated w/o complaints.  Waste discarded appropriately.  

## 2021-08-27 NOTE — ED Notes (Signed)
Pt had a good night. He was very cooperative when given instructions.

## 2021-08-27 NOTE — ED Notes (Signed)
Pt sleeping. Will get vitals when pt awakes

## 2021-08-27 NOTE — ED Notes (Signed)
Hospital dinner meal provided, pt tolerated w/o complaints.  Waste discarded appropriately.  

## 2021-08-27 NOTE — ED Notes (Signed)
0601 am- pt was cooperative and allowed the NT to take his vitals

## 2021-08-27 NOTE — ED Notes (Signed)
Pt continues to ambulate, staff close, pt out of room several times, but is redirected back to his room.

## 2021-08-27 NOTE — ED Notes (Signed)
Report received from [place], [place]. Patient currently sleeping, respirations regular and unlabored. Q15 minute rounds and observation by Psychologist, counselling to continue. Will assess patient once awake. Will collect patient vital signs once patient wakes up.

## 2021-08-27 NOTE — ED Notes (Signed)
Discussed pt's behavior with md, orders for po meds received. Pt continues to yell.

## 2021-08-27 NOTE — ED Notes (Signed)
Pt pointed to his right foot and said, "It's aching."

## 2021-08-27 NOTE — ED Notes (Signed)
Report to katie, rn 

## 2021-08-27 NOTE — ED Notes (Signed)
Pt up yelling in hallway, pt easily redirected back to room by kat, ed tech and security.

## 2021-08-27 NOTE — ED Notes (Signed)
Pt walking down hallway towards ambulance bay.  Pt had to be redirected back into room.  Was assisted by staff x2 back into room.  Cont to monitor for any changes in behaviors

## 2021-08-27 NOTE — ED Notes (Signed)
Pt up and ambulatory around room much more steady on his feet. Kat, ed tech remains close to monitor.

## 2021-08-27 NOTE — ED Notes (Signed)
Dr. Scotty Court states that medications for patients can be held until he wakes up due to receving IM med at 1821 and has been asleep since. Will notify physician when patient wakes up

## 2021-08-27 NOTE — ED Notes (Signed)
Pt laying across bed in his underwear, respirations even and unlabored. Pt remains in same position as he was found on arrival.

## 2021-08-27 NOTE — ED Notes (Signed)
Pt continues to escalate in room becoming increasingly agitated. PRN IM ordered and administered w/o incident.  Cont to monitor pt for any additional changes in behaviors

## 2021-08-27 NOTE — ED Notes (Signed)
Rechecked pt's pox with different probe, pox 96% on ra and HR 94 at this time.

## 2021-08-27 NOTE — ED Notes (Signed)
Hospital lunch meal provided.  100% consumed, pt tolerated w/o complaints.  Waste discarded appropriately.  

## 2021-08-27 NOTE — ED Notes (Signed)
Pt mother/guardian called requesting an update on pt.  Mother/guardian stated that she "believes that since Bobo hasn't been calling her as much that something must have changed."  Medications reviewed as well as guardian updated on pt behaviors.  Guardian then ask to speak with MD and SW regarding pt changes and in hopes of setting up virtual visits since she has been house bound with a broken foot.  Note to provider and counselor sent with request.

## 2021-08-27 NOTE — ED Notes (Signed)
Set-up assistance in bathroom to allow patient to shower.  Staff on standby for assistance during pt shower.  Pt was given hygiene items as well as:  I clean top, 1 clean bottom, with 1 pair of disposable underwear.  Pt changed out into clean clothing.  Staff disposed of all shower supplies. Pt refused to get dressed once showered, male staff had to 1:1 assist to aid in dressing pt.  Shower room cleaned for next use.

## 2021-08-27 NOTE — ED Notes (Signed)
Pt spilled water on bed and himself. Pt changed into dry clothes. Pt is wearing his glasses, sheets changed.

## 2021-08-27 NOTE — ED Notes (Signed)
Pt is quietly resting in his bed while listening to country music.

## 2021-08-28 MED ORDER — LORAZEPAM 2 MG/ML IJ SOLN
2.0000 mg | Freq: Once | INTRAMUSCULAR | Status: AC
  Administered 2021-08-28: 2 mg via INTRAMUSCULAR
  Filled 2021-08-28: qty 1

## 2021-08-28 MED ORDER — HALOPERIDOL LACTATE 5 MG/ML IJ SOLN
5.0000 mg | Freq: Once | INTRAMUSCULAR | Status: AC
  Administered 2021-08-28: 5 mg via INTRAMUSCULAR
  Filled 2021-08-28: qty 1

## 2021-08-28 NOTE — ED Notes (Signed)
Patient is now awake a this time, pt did not receive his night time medications due to a recent im injection. Dr. York Cerise states pt can receive all night time medications. Medications have been placed in apple sauce for administration as pt took willingly like this earlier today. Pt is already unhappy at this time due to interaction with officer in quad and [employer]. Pt constantly yelling no in room, is not open to applesauce at this time. Continue to yell no and point at applesauce and this nurse

## 2021-08-28 NOTE — ED Notes (Signed)
Pt out to hallway, smiling, pointing to room stating "woah" and "machine" it is unclear what he means after several attempts to clarify. Pt was asked if he wants water and he states "yeah." Water provided and patient returns to room at this time.

## 2021-08-28 NOTE — ED Notes (Signed)
Pt takes injection willingly, will obtain EKG after pt calms for accurate image

## 2021-08-28 NOTE — ED Notes (Signed)
Pt continues to refuse medications or anything from staff including drink

## 2021-08-28 NOTE — ED Notes (Signed)
Pt took the ativan and gabapentin crushed in applesauce as they were placed in a different cup; due to being counted medications. Pt then given the rest of medications crushed in applesauce and he spits applesauce on the floor. Refuses to take any more medications.

## 2021-08-28 NOTE — ED Notes (Signed)
Pt constantly yelling no anytime communication is taken part, will not collect vitals as each time you approach the patient he crosses his arm and refuses to move. Will continue to evaluate situation

## 2021-08-28 NOTE — ED Notes (Signed)
Pt lifts up sleeve of left deltoid and willingly takes ordered haldol IM shot. Given phone and sprite.

## 2021-08-28 NOTE — ED Notes (Signed)
VOL  PENDING  PLACEMENT 

## 2021-08-28 NOTE — ED Notes (Signed)
Pt attempts to go into another patients room, has to be stepped in front of when he goes and opens the door. Pt yells at this nurse.

## 2021-08-28 NOTE — ED Notes (Signed)
Meal tray given 

## 2021-08-28 NOTE — ED Notes (Addendum)
Report received from  [facility] including SBAR. Patient alert and oriented, warm and dry, agitated and pacing, unable to communicate due to difficulty in speaking. Patient made aware of Q15 minute rounds and security cameras for their safety. Patient instructed to come to this nurse with needs or concerns.

## 2021-08-28 NOTE — ED Notes (Signed)
Hospital meal provided.  50% consumed, pt tolerated w/o complaints.  Waste discarded appropriately.   

## 2021-08-28 NOTE — ED Notes (Signed)
Pt has continued to express the same behaviors. Pt is not effecting the behaviors of other patients and resulting in others being agitated. Dr. Erma Heritage notified of situation order to follow.

## 2021-08-28 NOTE — ED Notes (Signed)
Pt refused snack @ this time.

## 2021-08-28 NOTE — ED Notes (Signed)
Up into the wheelchair with no problems. Ambulatory, laughing, eating and drinking. Taken to [employer] by Secondary school teacher via wheelchair.

## 2021-08-28 NOTE — ED Notes (Signed)
Gave pt lunch with juice. Patient is refusing everything at this time.

## 2021-08-28 NOTE — TOC Progression Note (Addendum)
Transition of Care [facility]) - Progression Note    Patient Details  Name: Adam [name] MRN: [id number] Date of Birth: August 15, 1961  Transition of Care [facility]) CM/SW Contact  [name] Phone Number: [phone] 08/28/2021, 2:36 PM  Clinical Narrative:     CSW spoke with patient's mother /HCPOA Adam [name] [phone], requesting an updated on Medicaid application process.  Ms. Chabot stated she has contacted and left two voicemails at the social security office to request a rescind for the patient's disability.  Ms. Laser stated she would ensure she speaks to someone from social security office in the next 48 hours.  CSW stated I would call her on Thursday for an update.  Due to patient behavioral  escalation it is unlikely placement offers will be forthcoming. Patient does not have Medicaid therefore placement will be difficult, since patient is unable to private pay. No placement offers.  Expected Discharge Plan: Group Home Barriers to Discharge: Continued Medical Work up  Expected Discharge Plan and Services Expected Discharge Plan: Group Home In-house Referral: Clinical Social Work     Living arrangements for the past 2 months: Group Home                                       Social Determinants of Health (SDOH) Interventions    Readmission Risk Interventions No flowsheet data found.

## 2021-08-28 NOTE — ED Notes (Signed)
Pt reamins asleep following IM injection. Dr. Erma Heritage states to allow pt to sleep and reevaluate when pt wakes up. Meds will be held

## 2021-08-28 NOTE — ED Notes (Signed)
Pt continues to be agitated, unable to obtain EKG. Dr. Fredda Hammed states to allow pt to calm before obtaining

## 2021-08-28 NOTE — ED Notes (Signed)
Pt is uncooperative on this nurse arrival. Pt is yelling, banging on glass, attempting to go into other patients rooms, and baging and yanking on doors. There is no redirection of patient, unable to communicate, he continues to yell no, machine and bathroom but goes away from area when restroom is open

## 2021-08-28 NOTE — ED Notes (Signed)
Pt continues to attempt to pull down pants and use restroom in corner of room. Pt refuses to go to restroom and keeps yelling now and pulling his pants down. This nurse remains with patient until he stops this attempt. Stil does not go to restroom but lays in bed, unclear if patient was attempting to masturbate or urinate.

## 2021-08-28 NOTE — ED Notes (Signed)
Pt continues to pace around in room, refuses to take medications. Pt stomping around In room.

## 2021-08-28 NOTE — ED Notes (Signed)
Meal tray given to patient.

## 2021-08-28 NOTE — ED Notes (Signed)
Pt exits bathroom after large BM. Observed due to not flushing toilet

## 2021-08-28 NOTE — ED Notes (Addendum)
BP cuff placed on patient by RN. Pt yelling "no, no, no" and rips it off despite encouragement. Refused all meds. Yelling and swinging arms at RN. Pt noted to be clean and dry at this time.

## 2021-08-28 NOTE — ED Provider Notes (Signed)
-----------------------------------------   3:59 AM on 08/28/2021 -----------------------------------------   Blood pressure (!) 141/100, pulse 72, temperature 98.7 F (37.1 C), temperature source Oral, resp. rate 20, height 1.727 m (5\' 8" ), weight 79.4 kg, SpO2 98 %.  The patient is calm and cooperative at this time.  Reportedly earlier today he became aggressive and loud and required a dose of Geodon.  He subsequently slept and then when he woke up around 2 AM he was given his evening meds.  Awaiting TOC disposition plan.   , MD 08/28/21 [phone]

## 2021-08-28 NOTE — ED Notes (Signed)
Patient exits room and attempts to to enter into another patients room. Pt is redirected back to room, pt has torn all clothing off. Pt provided with clean, new scrubs to put on

## 2021-08-28 NOTE — ED Notes (Signed)
VOL/pending placement 

## 2021-08-28 NOTE — ED Notes (Signed)
Pt ambulates to restroom at this time.

## 2021-08-28 NOTE — ED Notes (Signed)
EKG given to Dr. Erma Heritage. Communicated with Isaacs about pt HR from prior vitals and EKG. He denies any intervention for BP or HR.

## 2021-08-28 NOTE — ED Notes (Addendum)
While RN checking in another patient, pt up and walking to bathroom. Pt then trying to walk down hallway. Had pt be redirected by security. When RN into pt room to see what was wrong, he grabbed RN hand with glove on it and ripped it off. Pt then hides glove in palm of hand and refuses to give it back. Pt then strikes RN on thigh with fist, yelling "no, no, no". Pt uncooperative. Pt will full strength, ambulates with no difficulty.

## 2021-08-29 MED ORDER — ZIPRASIDONE MESYLATE 20 MG IM SOLR
20.0000 mg | Freq: Two times a day (BID) | INTRAMUSCULAR | Status: DC | PRN
  Administered 2021-08-29 – 2021-11-09 (×15): 20 mg via INTRAMUSCULAR
  Filled 2021-08-29 (×21): qty 20

## 2021-08-29 NOTE — ED Notes (Signed)
Pt's mother (guardian) called to check on patient. Ms. Hann expressed concern over pt's current behavior. "He has never been through anything like this."  Call transferred to pt phone for pt to speak with his mother.

## 2021-08-29 NOTE — ED Notes (Signed)
Vol /pending placement 

## 2021-08-29 NOTE — ED Notes (Addendum)
Pt agitated, yelling and entering other rooms on unit. Security officers directed patient back to dayroom.  RN dialed phone so pt could speak to his mother.  Pt able to calm down and take scheduled oral medications.   Charge nurse made aware of pt behaviors.

## 2021-08-29 NOTE — ED Notes (Signed)
VOL/pending placement 

## 2021-08-29 NOTE — ED Notes (Signed)
Meal tray given 

## 2021-08-29 NOTE — ED Notes (Signed)
Pt refused vitals 

## 2021-08-29 NOTE — ED Notes (Signed)
Pt still sleeping.  VS and medications will be completed once awake.

## 2021-08-29 NOTE — ED Notes (Signed)
Patient become agitated and trying to come into nurses station. Security had to push patient back into dayroom. Patient was redirected by this Clinical research associate. ED tech tried to obtain vitals patient refused. Patient started screaming at Optometrist.  Patient received IM Geodon per orders for agitation. Patient is currently in room watching TV

## 2021-08-29 NOTE — ED Notes (Signed)
Pt asleep at this time, unable to collect vitals. Will collect pt vitals once awake. 

## 2021-08-29 NOTE — ED Notes (Signed)
Pt sleeping. Lunch tray saved in nurse's station.

## 2021-08-29 NOTE — ED Notes (Signed)
Breakfast tray placed in room ?

## 2021-08-29 NOTE — ED Notes (Signed)
Pt given coloring sheets and 1 crayon.

## 2021-08-30 NOTE — ED Notes (Signed)
Pt given meal tray and juice. 

## 2021-08-30 NOTE — ED Notes (Signed)
Pt given two cups of water 

## 2021-08-30 NOTE — ED Notes (Signed)
Pt given snack and soda 

## 2021-08-30 NOTE — ED Notes (Signed)
Pt trying to go into empty room-taking his belongings and things into room. Security officer at bedside to redirect. Belongings moved back to room 5.

## 2021-08-30 NOTE — ED Notes (Signed)
Pt given lunch tray and juice. 

## 2021-08-30 NOTE — ED Notes (Signed)
Pt given breakfast tray and juice. 

## 2021-08-30 NOTE — ED Notes (Signed)
Vol /pending placement 

## 2021-08-30 NOTE — ED Notes (Signed)
Pt given snack and sprite. ?

## 2021-08-30 NOTE — ED Notes (Signed)
Pt set up for shower. Pt needs redirection and assistance for shower. Pt given shower chair to not fall due to fall on this admission.

## 2021-08-30 NOTE — ED Notes (Signed)
Pt given phone to talk to mom

## 2021-08-31 NOTE — ED Notes (Signed)
Patient provided snack at appropriate snack time.  Pt consumed 100% of snack provided, tolerated well w/o complaints   Trash disposted of appropriately by patient.  

## 2021-08-31 NOTE — ED Notes (Signed)
Pt takes medications willingly in apple sauce

## 2021-08-31 NOTE — ED Notes (Addendum)
  Encouraged patient to tidy room, provided trash can for patient to throw away any trash in patient room with staff supervision.  

## 2021-08-31 NOTE — ED Notes (Signed)
Violently banging on nurses station door, yelling. Refusing to follow directions to back away from door. Unable to verbally deescalate patient.

## 2021-08-31 NOTE — ED Notes (Signed)
Pt has been yelling "somebody help me" "no" "stratacaster" "oh say can you see." Unclear what patient is needing, attempting to offer and provide beverage, food, new scrubs, and go to the restroom. Pt has continued to escalate and is now beating and kicking the door, attempted to push through staff and into nursing station when another staff member entered. Dr. Larinda Buttery notified of patients vitals (no intervention) and of situation. Night meds to be administered at this time.

## 2021-08-31 NOTE — ED Notes (Signed)
Unlocked bathroom door to allow patient to shower.  Staff continuously monitored dayroom outside of bathroom door during pt shower.  Pt was given hygiene items as well as:  I clean top, 1 clean bottom, with 1 pair of disposable underwear.  Pt changed out into clean clothing.  Staff disposed of all shower supplies. Shower room cleaned and secured for next use.  

## 2021-08-31 NOTE — ED Notes (Signed)
Report received from [place], [place] including situation, background, assessment and recommendations. Patient sleeping, respirations regular and unlabored. Q15 minute rounds and security camera observation to continue. Will assess patient once awake.

## 2021-08-31 NOTE — ED Notes (Signed)
Pt awake, immediately to the dayroom. Pt allows for vitals to be obtained and then escalates suddenly.

## 2021-08-31 NOTE — ED Notes (Signed)
Hospital meal provided.  100% consumed, pt tolerated w/o complaints.  Waste discarded appropriately.   

## 2021-08-31 NOTE — TOC Progression Note (Signed)
Transition of Care [facility]) - Progression Note    Patient Details  Name: Adam [name] MRN: [id number] Date of Birth: 20-Jul-1961  Transition of Care [facility]) CM/SW Contact  [name] Phone Number: [phone] 08/31/2021, 11:38 AM  Clinical Narrative:     CSW called patient's mother [place] Bury [phone], to request update on contact to social security for disability. [name] voicemail is full, unable to leave voicemail. CSW left voicemail for [name] [phone], w/ [facility] MCO/LME, requesting to speak with him about delay in rescinding the patient's disability.  CSW called [facility] DSS to report abandonment and possible extortion.  Expected Discharge Plan: Group Home Barriers to Discharge: Continued Medical Work up  Expected Discharge Plan and Services Expected Discharge Plan: Group Home In-house Referral: Clinical Social Work     Living arrangements for the past 2 months: Group Home                                       Social Determinants of Health (SDOH) Interventions    Readmission Risk Interventions No flowsheet data found.

## 2021-08-31 NOTE — ED Notes (Addendum)
Pt walking around unit yelling "no table, no table". Pt yanking on door handle and banging on door. Security and RN to attempt to verbally de-escalate patient. Hospital meal provided.  100% consumed.  Waste discarded appropriately.

## 2021-08-31 NOTE — ED Notes (Signed)
Unlocked bathroom door to allow patient to use restroom. Pt exited and bathroom door locked behind patient by staff.  

## 2021-08-31 NOTE — TOC Progression Note (Addendum)
Transition of Care [facility]) - Progression Note    Patient Details  Name: Azlan Hanway MRN: [id number] Date of Birth: May 10, 1961  Transition of Care [facility]) CM/SW Contact  [name] Phone Number: [phone] 08/31/2021, 2:18 PM  Clinical Narrative:     CSW spoke with [name] [phone], w/ [facility] MCO/LME in reference to the patient's disability payments.  [name] stated he spoke with patient's mother Ms. Mcneel and they called social security office and they were told the patient will not be able to rescind his disability payments.  Patient receives 997.00 from parents of a disabled child, and 721.00 disability for hours worked. [name] stated he will ensure Ms. Asbridge contacts [facility] DSS and find out if there is a waiver for the patient to qualify for Medicaid or to apply for Special Assistance Medicaid. [name] stated he or Ms. Castrogiovanni would update this CSW when there was more information.  Expected Discharge Plan: Group Home Barriers to Discharge: Continued Medical Work up  Expected Discharge Plan and Services Expected Discharge Plan: Group Home In-house Referral: Clinical Social Work     Living arrangements for the past 2 months: Group Home                                       Social Determinants of Health (SDOH) Interventions    Readmission Risk Interventions No flowsheet data found.

## 2021-08-31 NOTE — ED Notes (Addendum)
Walking around unit shouting loudly and aggressively at staff and yanking on door handle.

## 2021-08-31 NOTE — ED Provider Notes (Signed)
Emergency Medicine Observation Re-evaluation Note  Adam [name] is a 60 y.o. male, seen on rounds today.  Pt initially presented to the ED for complaints of Psychiatric Evaluation  Currently, the patient is calm, no acute complaints.  Physical Exam  Blood pressure 128/88, pulse 99, temperature 98.7 F (37.1 C), temperature source Oral, resp. rate 16, height 5\' 8"  (1.727 m), weight 79.4 kg, SpO2 99 %. Physical Exam General: NAD Lungs: CTAB Psych: not agitated  ED Course / MDM  EKG:    I have reviewed the labs performed to date as well as medications administered while in observation.  Recent changes in the last 24 hours include no acute events overnight.    Plan  Current plan is for TOC dispo. Patient is not under full IVC at this time.   , MD 08/31/21 [phone]

## 2021-08-31 NOTE — ED Notes (Signed)
Pt back in room and appears to be sleeping.

## 2021-08-31 NOTE — ED Notes (Signed)
Pt repetitively stating, "day and day parade" pt has stopped beating and yelling but is standing in the day room

## 2021-08-31 NOTE — ED Notes (Signed)
Pt is becoming frustrated with staff because he is stating "wonder woman" and holding a paper towel in his hand. Pt does not want more paper towels, the remote, to throw it in the trash or anything else we ask or offer. Pt escalating verbally.

## 2021-09-01 NOTE — ED Provider Notes (Signed)
Emergency Medicine Observation Re-evaluation Note  Adam [name] is a 60 y.o. male, seen on rounds today.  Pt initially presented to the ED for complaints of Psychiatric Evaluation  Currently, the patient is calm, no acute complaints.  Physical Exam   Vitals:   08/31/21 1625 08/31/21 1956  BP: (!) 115/95 (!) 158/105  Pulse: 95 (!) 109  Resp: 16 20  Temp: 98.2 F (36.8 C) 98.2 F (36.8 C)  SpO2: 98% 100%    Physical Exam General: NAD Lungs: CTAB Psych: not agitated, currently resting comfortably on bed without difficulty  ED Course / MDM  EKG:    I have reviewed the labs performed to date as well as medications administered while in observation.  Recent changes in the last 24 hours include no acute events overnight.    Plan  Current plan is for TOC dispo. Patient is not under full IVC at this time.     Sharyn Creamer, MD 09/01/21 [phone]

## 2021-09-01 NOTE — ED Notes (Signed)
Pt awakened when RN took in lunch, shouting "no, no, no" at this RN. Refusing to eat, refusing vitals and refusing to take PO meds currently.

## 2021-09-01 NOTE — ED Notes (Signed)
Pt asleep at this time, unable to collect vitals. Will collect pt vitals once awake. 

## 2021-09-01 NOTE — ED Notes (Signed)
Ambulating around unit yelling. Refusing VS.

## 2021-09-01 NOTE — ED Notes (Signed)
Pt agreeable to take mediations and allows VS to be taken. Pt now using phone.

## 2021-09-01 NOTE — ED Notes (Signed)
Pt accepting of pills now, new pills produced. Pt declines to have blood pressure rechecked. Mouth checked to make sure pills consumed post administration.

## 2021-09-01 NOTE — ED Notes (Signed)
Attempted to get blood pressure on pt multiple times, pt will not stop moving, will attempt again with PM meds.

## 2021-09-01 NOTE — ED Notes (Signed)
DSS visited patient, pt asleep.

## 2021-09-01 NOTE — ED Notes (Signed)
Patient provided snack at appropriate snack time.  Pt consumed 100% of snack provided, tolerated well w/o complaints   Trash disposted of appropriately by patient.  

## 2021-09-01 NOTE — ED Notes (Signed)
Unlocked bathroom door to allow patient to use restroom. Pt exited and bathroom door locked behind patient by staff.  

## 2021-09-01 NOTE — ED Notes (Signed)
Vol pending s/w placement 

## 2021-09-01 NOTE — ED Notes (Signed)
VOL/pending placement 

## 2021-09-01 NOTE — ED Notes (Signed)
Pt threw pills on floor, refused to take.

## 2021-09-01 NOTE — ED Notes (Signed)
Pt yelling "dirty, dirty". Given new scrubs, which pt smiles about and goes to room to change.

## 2021-09-02 NOTE — ED Provider Notes (Signed)
Emergency Medicine Observation Re-evaluation Note  Adam [name] is a 60 y.o. male, seen on rounds today.  Pt initially presented to the ED for complaints of Psychiatric Evaluation Currently, the patient is resting comfortably.  Physical Exam  BP 124/86   Pulse 95   Temp 97.8 F (36.6 C) (Oral)   Resp 18   Ht 5\' 8"  (1.727 m)   Wt 79.4 kg   SpO2 97%   BMI 26.61 kg/m  Physical Exam General: No acute distress Lungs: No respiratory distress Psych: No agitation  ED Course / MDM    I have reviewed the labs performed to date as well as medications administered while in observation.  Recent changes in the last 24 hours include no change.  Plan  Current plan is for social work placement.  Adeyemi Hamad is not under involuntary commitment.     [name], MD 09/02/21 1250

## 2021-09-02 NOTE — ED Notes (Signed)
RN dialed pt's mother's number for him to call.  She did not answer at this time.

## 2021-09-02 NOTE — ED Notes (Signed)
Pt remains in his room, moving around

## 2021-09-02 NOTE — ED Notes (Signed)
Report to amy, rn

## 2021-09-02 NOTE — ED Notes (Signed)
Pt up banging on other pt's doors. Pt is not redirectable. Pt is disturbing other pts. Pt taken back to his room by this RN, offered to cover pt, pt declines. Pt informed that he needs to refrain from banging on other pt's doors.

## 2021-09-02 NOTE — ED Notes (Signed)
Pt speaking with his mother on the phone. 

## 2021-09-02 NOTE — ED Notes (Signed)
Pt given lunch tray.

## 2021-09-02 NOTE — ED Notes (Signed)
Meal tray given 

## 2021-09-02 NOTE — ED Notes (Signed)
Pt refused to let this tech take VS

## 2021-09-02 NOTE — ED Notes (Signed)
Pt given clean scrubs.  

## 2021-09-02 NOTE — ED Notes (Signed)
Pt tried taking a bottle of lotion from Clinical research associate, but was retrieved w/ help of security

## 2021-09-02 NOTE — ED Notes (Signed)
Pt requested lotion and preceded to take the bottle out of writers hands. Writer retreive the bottle and pt got frustrated e

## 2021-09-02 NOTE — ED Notes (Signed)
Pt took pills while in dayroom.  Pt appeared to swallow all pills and opened his mouth for this writer to look.  No pills seen.

## 2021-09-02 NOTE — ED Notes (Signed)
Pt still in bed, refusing medication at this time.

## 2021-09-02 NOTE — ED Notes (Signed)
Pt given breakfast tray

## 2021-09-02 NOTE — ED Notes (Signed)
Snack given at this time  

## 2021-09-03 NOTE — ED Notes (Signed)
Pt up constantly beating on windows and doors to nurses station. Pt states "cold" earlier and was rubbing arms, pt offered blanket, and states "yeah", blanket provided and wrapped around shoulders. Pt immediately yelling name"Adam [name]" repeatedly. Pt ambulating around day room, offered restroom food, water and yells "no". Skin warm and dry, resps unlabored.

## 2021-09-03 NOTE — ED Provider Notes (Signed)
Emergency Medicine Observation Re-evaluation Note  Adam [name] is a 60 y.o. male, seen on rounds today.  Pt initially presented to the ED for complaints of Psychiatric Evaluation Currently, the patient is resting, no acute concerns.  Physical Exam  BP 120/87 (BP Location: Right Arm)   Pulse 94   Temp 97.9 F (36.6 C) (Oral)   Resp 18   Ht 5\' 8"  (1.727 m)   Wt 79.4 kg   SpO2 100%   BMI 26.61 kg/m  Physical Exam General: no acute distress Psych: calm  ED Course / MDM  EKG:  I have reviewed the labs performed to date as well as medications administered while in observation.  Recent changes in the last 24 hours include none.  Plan  Current plan is for social work dispo.  Legend Tumminello is not under involuntary commitment.     [name], MD 09/03/21 [phone]

## 2021-09-03 NOTE — ED Notes (Signed)
Pt. Is currently still sleeping, breakfast is in nursing station.

## 2021-09-03 NOTE — ED Notes (Signed)
Trash collected from pt's room.  Cups of urine emptied in toilet and removed.

## 2021-09-03 NOTE — ED Notes (Addendum)
Pt given the phone to talk to his mother after becoming upset.  RN will attempt to give medications after phone call.

## 2021-09-03 NOTE — ED Notes (Signed)
IM Haldol given without incident.   Security present.

## 2021-09-03 NOTE — ED Notes (Signed)
Pt. Was given lunch tray and a drink. 

## 2021-09-03 NOTE — ED Notes (Signed)
Attempted on collecting vitals for pt. refused

## 2021-09-03 NOTE — ED Notes (Signed)
Pt at nurse's station door knocking repeatedly.

## 2021-09-03 NOTE — ED Notes (Signed)
PO medications wasted with Kathlen Mody, Charity fundraiser.

## 2021-09-03 NOTE — ED Notes (Signed)
Pt dumped pills on floor in dayroom.  All pills picked up by RN and NT.  Charge RN made aware. NP made aware.

## 2021-09-03 NOTE — ED Notes (Signed)
Pt given coloring sheets and 2 crayons to help redirect patient.

## 2021-09-03 NOTE — ED Notes (Signed)
Pt given dinner tray.

## 2021-09-03 NOTE — ED Notes (Signed)
Pt took PO medications for [employer].

## 2021-09-03 NOTE — ED Notes (Signed)
VOL/pending placement 

## 2021-09-03 NOTE — ED Notes (Signed)
Pt currently sleeping. Unable to obtain VS at this time. Will obtain VS if pt wakes up before shift ends.

## 2021-09-03 NOTE — ED Notes (Signed)
Pt allowed to use the phone. 

## 2021-09-03 NOTE — ED Notes (Signed)
Pt standing at nurse's station door continuously knocking.   Pt redirected by calling his mother.  Pt on phone in the dayroom.

## 2021-09-04 NOTE — ED Notes (Signed)
Pt up beating on nursing station door. Pt states "[place]". Pt offered drink, bathroom, hand sanitizer, declines. Pt informed to return to room as it is nighttime and time to sleep.

## 2021-09-04 NOTE — ED Notes (Signed)
Hospital meal provided.  50% consumed, pt tolerated w/o complaints.  Waste discarded appropriately.   

## 2021-09-04 NOTE — ED Notes (Signed)
Pt given dinner tray.

## 2021-09-04 NOTE — ED Notes (Signed)
Pt offered urinal when asking to use restroom. Informed bathrooms are out of service for repair. Pt refused urinal.

## 2021-09-04 NOTE — ED Provider Notes (Signed)
Emergency Medicine Observation Re-evaluation Note  Adam [name] is a 60 y.o. male, seen on rounds today.   Physical Exam  BP 118/73   Pulse 99   Temp (!) 97.2 F (36.2 C) (Oral)   Resp 17   Ht 5\' 8"  (1.727 m)   Wt 79.4 kg   SpO2 98%   BMI 26.61 kg/m  Physical Exam General: Patient currently resting quietly in bed.  Earlier he was yelling and banging on the window but he is not now.  I asked the nurse that she wanted anything to calm him and she said its not needed currently.  I agree. Lungs: Patient in no respiratory distress Psych: Patient not currently agitated  ED Course / MDM  EKG:  Plan  Current plan is for patient for social work placement  Qamar Aughenbaugh is not under involuntary commitment.     [name], MD 09/04/21 [phone]

## 2021-09-04 NOTE — ED Notes (Signed)
Report to ally, rn.  

## 2021-09-04 NOTE — ED Notes (Signed)
Pt given snack. 

## 2021-09-04 NOTE — ED Notes (Signed)
Pt given snack and sprite. ?

## 2021-09-04 NOTE — ED Notes (Signed)
VOL/pending sw placement 

## 2021-09-05 MED ORDER — ACETAMINOPHEN 500 MG PO TABS
1000.0000 mg | ORAL_TABLET | Freq: Four times a day (QID) | ORAL | Status: DC | PRN
  Administered 2021-09-05 – 2021-11-17 (×8): 1000 mg via ORAL
  Filled 2021-09-05 (×8): qty 2

## 2021-09-05 MED ORDER — LORAZEPAM 2 MG/ML IJ SOLN
1.0000 mg | Freq: Once | INTRAMUSCULAR | Status: AC
  Administered 2021-09-05: 1 mg via INTRAMUSCULAR
  Filled 2021-09-05: qty 1

## 2021-09-05 NOTE — ED Notes (Signed)
Pt has suddenly came out of room, yelling shoes, stratacaster, no way at staff. Keeps hitting on door. Pt given socks. This does not help pt calm. Unable to calm pt at this time. Will continue to attempt to deescalate

## 2021-09-05 NOTE — ED Notes (Signed)
Pt takes medications at this time. Jeannene Patella provides these for pt to take, pt takes from Vernona Rieger, this nurse was present and beside tech for pt to take meds in apple sauce. Pt takes all in cup.

## 2021-09-05 NOTE — ED Notes (Signed)
Hospital meal provided.  100% consumed, pt tolerated w/o complaints.  Waste discarded appropriately.   

## 2021-09-05 NOTE — ED Notes (Signed)
Pt continues to request to call his mother/guardian, she has spoken to staff and stated she will call back this afternoon when "she has time to talk to him."  Patient was notified

## 2021-09-05 NOTE — ED Notes (Addendum)
Pt offered night time snack.  Pt said he just wanted sprite but nothing to eat.  Tech was taking trash can around to collect trash after snack time. This pt came over to tech and threw away a piece of paper. Tech did not look at what was thrown away.  Shortly after pt began getting very loud and extremely aggregated.  He kept knocking on the door of the office and pointing/ Staff noticed that the paper thrown away was his numbers sheet along with a coloring page. He seem content when retrieving his papers back, however continued to loudly yell and speak names and sing loudly again. He also would come up to office and hit the window and then walk away. He then came up to door and asked for dessert. He was given a mango iccee and walked around unit eating it.   He called some of the staff ugly names and refused his medication. RN has tried multiple times to re give the medication.  Currently now in his room at this time.

## 2021-09-05 NOTE — ED Notes (Signed)
Pt came to door again yelling. When tech opened door he pointed to his socks. Staff asked if his socks were wet and pt said yes. Pt continued pointing at socks. Tech offered new socks and pt took socks and put them over top of existing socks. Pt continues to yell and beat on the windows. RN is in the dayroom talking to pt at this time.

## 2021-09-05 NOTE — ED Notes (Signed)
This nurse has attempted to provide pt with his night time medications. Pt repeatedly refuses to take meds and gets agitated each time he is asked to take them. Meds are crushed and in apple sauce for admin. Will continue to attempt to provide to pt

## 2021-09-05 NOTE — ED Notes (Signed)
PT did not receive phone during phone hours. Pt provided with phone, calls mother. Phone returned when finished and this nurse stayed with pt through call.

## 2021-09-05 NOTE — ED Notes (Signed)
Report received from Katie, RN including SBAR. Patient alert and oriented, warm and dry, in no acute distress. Patient denies SI, HI, AVH and pain. Patient made aware of Q15 minute rounds and security cameras for their safety. Patient instructed to come to this nurse with needs or concerns.  

## 2021-09-05 NOTE — ED Provider Notes (Signed)
Emergency Medicine Observation Re-evaluation Note  Adam [name] is a 60 y.o. male, seen on rounds today.  Pt initially presented to the ED for complaints of Psychiatric Evaluation Currently, the patient is standing in common area, denies complaints.  Physical Exam  BP 123/71 (BP Location: Right Arm)   Pulse 70   Temp 97.7 F (36.5 C) (Oral)   Resp 16   Ht 5\' 8"  (1.727 m)   Wt 79.4 kg   SpO2 98%   BMI 26.61 kg/m  Physical Exam Constitutional: Resting comfortably. Eyes: Conjunctivae are normal. Head: Atraumatic. Nose: No congestion/rhinnorhea. Mouth/Throat: Mucous membranes are moist. Neck: Normal ROM Cardiovascular: No cyanosis noted. Respiratory: Normal respiratory effort. Gastrointestinal: Non-distended. Genitourinary: deferred Musculoskeletal: No lower extremity tenderness nor edema. Neurologic:  Normal speech and language. No gross focal neurologic deficits are appreciated. Skin:  Skin is warm, dry and intact. No rash noted.   ED Course / MDM  EKG:EKG Interpretation  Date/Time:  Monday August 28 2021 20:12:52 EDT Ventricular Rate:  117 PR Interval:  162 QRS Duration: 104 QT Interval:  306 QTC Calculation: 426 R Axis:   -17 Text Interpretation: Sinus tachycardia Otherwise normal ECG Confirmed by UNCONFIRMED, DOCTOR (08-25-1992), editor [id number], Tammy [phone]) on 08/29/2021 8:10:23 AM  I have reviewed the labs performed to date as well as medications administered while in observation.  Recent changes in the last 24 hours include none.  Plan  Current plan is for placement per social work.  Adam [name] is not under involuntary commitment.     [name], MD 09/05/21 1131

## 2021-09-05 NOTE — ED Notes (Signed)
Patient provided snack at appropriate snack time.  Pt consumed 100% of snack provided, tolerated well w/o complaints   Trash disposted of appropriately by patient.  

## 2021-09-05 NOTE — ED Notes (Signed)
Pt continues to yell out random words. Staff has continued to try and decipher what he wants with no success. He has now continued to yell his name and knock on glass. Provided with another cup of water. He is yelling "english" "no way" "Adam [name]" and other things. He continues to point to clock and into nurses station stating his name

## 2021-09-05 NOTE — TOC Progression Note (Signed)
Transition of Care [facility]) - Progression Note    Patient Details  Name: Adam [name] MRN: [id number] Date of Birth: February 19, 1961  Transition of Care [facility]) CM/SW Contact  [name] Phone Number: [phone] 09/05/2021, 9:39 AM  Clinical Narrative:     CSW left voicemail [facility] Intake Coordinator [phone] for possible placement option.  CSW spoke with Donna Bernard [facility] [phone], who stated she did not have placement at her facility.  Expected Discharge Plan: Group Home Barriers to Discharge: Continued Medical Work up  Expected Discharge Plan and Services Expected Discharge Plan: Group Home In-house Referral: Clinical Social Work     Living arrangements for the past 2 months: Group Home                                       Social Determinants of Health (SDOH) Interventions    Readmission Risk Interventions No flowsheet data found.

## 2021-09-05 NOTE — ED Notes (Signed)
Pt has escalated at this time, attempting to deescalate. Pt became upset due to the fact he threw away papers into the trash and now wants them back. This took some time to realize. Pt provided with papers back.

## 2021-09-05 NOTE — ED Notes (Signed)
VOL/pending placement 

## 2021-09-05 NOTE — ED Notes (Signed)
Pt continues to yell out, has been beating on windows, this nurse, Tech, Vernona Rieger, and security officers have been working with pt in attempt to help him with his needs. It is unclear what pt needs. Pt does say "concerns" and "feet" he had been given socks that he placed on his feet, has now taken them off. Pt continues to yell, secure chat is sent to Dr. Elesa Massed at this time to notify of situation.

## 2021-09-05 NOTE — ED Notes (Signed)
VOL  PENDING  PLACEMENT 

## 2021-09-05 NOTE — ED Notes (Signed)
Unlocked bathroom door to allow patient to use restroom. Pt exited and bathroom door locked behind patient by staff.

## 2021-09-05 NOTE — ED Notes (Signed)
Pt takes medications willingly, pt continued with his loud yelling and was not happy with it. After telling pt multiple times that the tylenol was for his feet and it would help he agreed to both.

## 2021-09-05 NOTE — ED Notes (Signed)
Unlocked bathroom door to allow patient to shower.  Staff continuously monitored dayroom outside of bathroom door during pt shower.  Pt was given hygiene items as well as:  I clean top, 1 clean bottom, with 1 pair of disposable underwear.  Pt changed out into clean clothing.  Staff disposed of all shower supplies. Shower room cleaned and secured for next use.  

## 2021-09-06 NOTE — ED Notes (Signed)
Patient requesting shower, when provided with bathroom supplies and towels patient noted to drop items into the floor and yell, "fuck it". Refuses shower at this time.

## 2021-09-06 NOTE — ED Notes (Signed)
Gave breakfast tray with juice. 

## 2021-09-06 NOTE — ED Notes (Signed)
ENVIRONMENTAL ASSESSMENT Potentially harmful objects out of patient reach: Yes.   Personal belongings secured: Yes.   Patient dressed in hospital provided attire only: Yes.   Plastic bags out of patient reach: Yes.   Patient care equipment (cords, cables, call bells, lines, and drains) shortened, removed, or accounted for: Yes.   Equipment and supplies removed from bottom of stretcher: Yes.   Potentially toxic materials out of patient reach: Yes.   Sharps container removed or out of patient reach: Yes.      Pt. Alert and oriented, warm and dry, in no distress. Pt. Denies SI, HI, and AVH. Pt. Encouraged to let nursing staff know of any concerns or needs.   

## 2021-09-06 NOTE — ED Notes (Signed)
Unlocked bathroom door to allow patient to use restroom. Pt exited and bathroom door locked behind patient by staff.

## 2021-09-06 NOTE — ED Notes (Signed)
Pt asleep, lunch tray and cup of sprite placed on chair in rm.

## 2021-09-06 NOTE — ED Notes (Signed)
Patient was provided a shower, but refused.

## 2021-09-06 NOTE — TOC Progression Note (Addendum)
Transition of Care [facility]) - Progression Note    Patient Details  Name: Dorman Calderwood MRN: [id number] Date of Birth: July 14, 1961  Transition of Care [facility]) CM/SW Contact  [name] Phone Number: [phone] 09/06/2021, 11:52 AM  Clinical Narrative:     CSW left voicemail [facility] Intake Coordinator [phone] for possible placement option.  CSW spoke with Donna Bernard [facility] [phone], and asked about private pay costs for facility, and gave CSW Accounting Dept contact information.  CSW spoke with Zara Chess [phone], who stated she would ask her colleagues about costs and any adjustments that can be made for the patient.  Ms. Manson Passey stated she would call this CSW with an update.   CSW left voicemail for [name] [phone], [facility] Coordinator w/ update and request to research payment options for facility.   Expected Discharge Plan: Group Home Barriers to Discharge: Continued Medical Work up  Expected Discharge Plan and Services Expected Discharge Plan: Group Home In-house Referral: Clinical Social Work     Living arrangements for the past 2 months: Group Home                                       Social Determinants of Health (SDOH) Interventions    Readmission Risk Interventions No flowsheet data found.

## 2021-09-06 NOTE — ED Notes (Signed)
Hospital meal provided.  100% consumed, pt tolerated w/o complaints.  Waste discarded appropriately.   

## 2021-09-06 NOTE — ED Provider Notes (Signed)
Emergency Medicine Observation Re-evaluation Note  Adam [name] is a 60 y.o. male, seen on rounds today.  Pt initially presented to the ED for complaints of Psychiatric Evaluation Currently, the patient is sleeping but arousable.    Physical Exam  BP (!) 154/87   Pulse 81   Temp 98.9 F (37.2 C)   Resp 18   Ht 5\' 8"  (1.727 m)   Wt 79.4 kg   SpO2 100%   BMI 26.61 kg/m  Physical Exam General: Comfortable appearing. Cardiac: Good peripheral perfusion. Lungs: Normal respiratory effort. Psych: Calm and cooperative.  ED Course / MDM   I have reviewed the labs performed to date as well as medications administered while in observation.  The patient had an episode of acute agitation earlier today, banging on the wall and was not able to be verbally redirected.  He was given his previously ordered as needed dose of Geodon with appropriate response.  Plan  Current plan is for social work disposition.  Adam [name] is not under involuntary commitment.     [name], MD 09/06/21 1506

## 2021-09-06 NOTE — ED Notes (Signed)
VOL/Pending Placement 

## 2021-09-06 NOTE — ED Notes (Signed)
Pt asleep at this time, unable to collect vitals. Will collect pt vitals once awake. 

## 2021-09-06 NOTE — ED Notes (Signed)
Patient screaming and trying to open doors and come into nurses station. Patient redirected by Clinical research associate and staff. Patient continues behavior.

## 2021-09-06 NOTE — ED Notes (Signed)
Immediately after medication administration patient at nursing station yelling, "no meds, no meds, fuck it, fuck it". Pt noted to be rubbing at left hand, when asked if patient has pain here he yells "no meds". Pt begins repeatedly banging on window and door of nurses station, unable to be redirected by nursing staff and security. Md Siadecki contacted regarding IM geodon. Patient escorted to room for IM medication administration.

## 2021-09-07 NOTE — ED Notes (Signed)
Pt took pills in dayroom.  Pt appeared to swallow pills.  No pills seen in pt's mouth by this Clinical research associate.

## 2021-09-07 NOTE — ED Notes (Signed)
Pt escalated suddenly and drastically without warning. Attempts to deescalate were made by multiple staff. Dr. Scotty Court informs this nurse that it is okay to administer patients prescribed night time medication at this time. Security is present, pt has not returned to room for the moment, will continue to monitor.

## 2021-09-07 NOTE — ED Notes (Addendum)
Pt standing at nurse's station door and yelling, "My Eyes!"  Pt is difficult to re-direct.  PRN and scheduled medications have been given.

## 2021-09-07 NOTE — ED Notes (Signed)
VOL/Pending Placement 

## 2021-09-07 NOTE — ED Notes (Signed)
Pt at nurse's station yelling, "Girls Girls Girls."

## 2021-09-07 NOTE — ED Notes (Signed)
Pt has calmed at this time, still paces but is more comfortable

## 2021-09-07 NOTE — ED Notes (Addendum)
Pt yelling and agitated.  Staff is unable to determine what the patient needs.  Pt continues to stand at the nurse's station door.  IM Geodon given per EDP.  Charge nurse made aware of situation.

## 2021-09-07 NOTE — ED Notes (Signed)
Pt contiues to yell and beat on windows. Pt had damaged the door handle and continues to yank on it. Pt does not respond.

## 2021-09-07 NOTE — ED Notes (Signed)
Pt given a snack 

## 2021-09-07 NOTE — ED Notes (Signed)
Pt wanted armband cut off.

## 2021-09-07 NOTE — ED Notes (Signed)
Pt provided with snack and drink at this time ?

## 2021-09-07 NOTE — ED Notes (Signed)
Pt sleeping after receiving IM medication.  Schedule medications will be held.

## 2021-09-07 NOTE — ED Notes (Signed)
Pt given meal tray.

## 2021-09-07 NOTE — ED Notes (Signed)
Patient resting quietly in room. No noted distress or abnormal behaviors noted. Will continue 15 minute checks and observation by security camera for safety. 

## 2021-09-07 NOTE — ED Notes (Signed)
TV turned on in room now, light off. Pt took injection willingly. Will continue to monitor

## 2021-09-07 NOTE — ED Notes (Signed)
Report received from Amy, RN including SBAR. Patient alert and oriented, warm and dry, in no acute distress. Patient denies SI, HI, AVH and pain. Patient made aware of Q15 minute rounds and security cameras for their safety. Patient instructed to come to this nurse with needs or concerns.  

## 2021-09-07 NOTE — ED Notes (Signed)
RN called patient's mom to help him calm down.

## 2021-09-07 NOTE — ED Provider Notes (Signed)
Emergency Medicine Observation Re-evaluation Note  Adam [name] is a 60 y.o. male, seen on rounds today.  Pt initially presented to the ED for complaints of Psychiatric Evaluation  Currently, the patient is standing at the door- hard to figure out what the patient is frustrated about but starting to bang on the door.   Physical Exam  Blood pressure (!) 121/91, pulse (!) 101, temperature 98.1 F (36.7 C), temperature source Oral, resp. rate 16, height 5\' 8"  (1.727 m), weight 79.4 kg, SpO2 97 %.  Physical Exam General: No apparent distress Resp: Normal WOB  Psych: starting to get some agitation      ED Course / MDM  viewed the labs performed to date as well as medications administered while in observation.  Recent changes in the last 24 hours include none- last given geodon 10 hours ago   Plan   Current plan is to continue to wait for SW placement Plan to give another dose of geodon if cant be redirected and continues to escalade  Patient is not under full IVC at this time.   , MD 09/07/21 [phone]

## 2021-09-07 NOTE — ED Notes (Signed)
Pt refused VS  

## 2021-09-07 NOTE — ED Notes (Signed)
Pt continues to escalate and is now pulling on the door handle as hard as he can and is yelling and banging on windows. Security is present, PRN to be given. Had already been drawn up earlier due to behaviors but verbal deescalation was successful. At this time, verbal deescalation has failed and multiple attempts to determine what pt is yelling about have ben unsuccessful.

## 2021-09-07 NOTE — ED Notes (Signed)
Lunch tray given. 

## 2021-09-07 NOTE — ED Notes (Signed)
Pt given new armband to try to help lower pt's agitation.

## 2021-09-08 NOTE — ED Notes (Signed)
Rounding completed with off going RN. Pt will notallow rn to remove trash from room.

## 2021-09-08 NOTE — TOC Progression Note (Signed)
Transition of Care [facility]) - Progression Note    Patient Details  Name: Eduardo Honor MRN: [id number] Date of Birth: 1961-05-12  Transition of Care [facility]) CM/SW Contact  [name] Phone Number: [phone] 09/08/2021, 8:17 AM  Clinical Narrative:     CSW spoke with Domingo Sep Business Director at [facility] regarding information on costs for private pay in their facilities.  Mr. Jed Limerick stated there was a family care home which would be an appropriate setting for the patient and he would contact [name] w/ [facility] [phone] to discuss placement details.   Expected Discharge Plan: Group Home Barriers to Discharge: Continued Medical Work up  Expected Discharge Plan and Services Expected Discharge Plan: Group Home In-house Referral: Clinical Social Work     Living arrangements for the past 2 months: Group Home                                       Social Determinants of Health (SDOH) Interventions    Readmission Risk Interventions No flowsheet data found.

## 2021-09-08 NOTE — TOC Progression Note (Addendum)
Transition of Care [facility]) - Progression Note    Patient Details  Name: Adam [name] MRN: [id number] Date of Birth: [birth year]  Transition of Care [facility]) CM/SW Contact  Joseph Art, [place] Phone Number: 09/08/2021, 9:48 AM  Clinical Narrative:     CSW left voicemail for Domingo Sep Business Director at [facility] [phone] requesting that he contact [name] w/ [facility] [phone], for placement discussion.  CSW sent email to [name] w/ Mr. Scherer's contact information.   Expected Discharge Plan: Group Home Barriers to Discharge: Continued Medical Work up  Expected Discharge Plan and Services Expected Discharge Plan: Group Home In-house Referral: Clinical Social Work     Living arrangements for the past 2 months: Group Home                                       Social Determinants of Health (SDOH) Interventions    Readmission Risk Interventions No flowsheet data found.

## 2021-09-08 NOTE — ED Notes (Signed)
Pt sitting on floor in front of nurse's station door.  Pt is crying.  Speech is unintelligible. Pt has spoken with his mother throughout the shift.

## 2021-09-08 NOTE — ED Notes (Signed)
Pt given snack. 

## 2021-09-08 NOTE — ED Notes (Signed)
Lunch tray given. 

## 2021-09-08 NOTE — ED Provider Notes (Signed)
Emergency Medicine Observation Re-evaluation Note  Adam [name] is a 60 y.o. male, seen on rounds today.  Physical Exam  BP (!) 135/95   Pulse (!) 101   Temp 98.7 F (37.1 C) (Oral)   Resp 18   Ht 5\' 8"  (1.727 m)   Wt 79.4 kg   SpO2 99%   BMI 26.61 kg/m  Physical Exam General: Patient resting comfortably in bed Lungs: Patient in no respiratory distress Psych: Patient not agitated  ED Course / MDM  EKG:  Plan  Current plan is for social work placement  Adam [name] is not under involuntary commitment.     [name], MD 09/08/21 [phone]

## 2021-09-08 NOTE — ED Notes (Signed)
Dinner tray given

## 2021-09-08 NOTE — ED Notes (Signed)
Pt given burgundy scrub top.

## 2021-09-08 NOTE — ED Notes (Signed)
Pt's room cleaned while pt in restroom.

## 2021-09-08 NOTE — ED Notes (Signed)
Pt needing frequent verbal redirection to stop knocking at window.

## 2021-09-08 NOTE — ED Notes (Signed)
Pt asleep at this time, unable to collect vitals. Will collect pt vitals once awake. 

## 2021-09-08 NOTE — ED Notes (Signed)
Breakfast tray placed in room.  Pt sleeping.

## 2021-09-08 NOTE — ED Notes (Signed)
Pt beginning to beat on doors and yell. Pt informed to keep voice down others are trying to sleep. Pt's sheets changed on bed, new socks provided.

## 2021-09-08 NOTE — TOC Progression Note (Signed)
Transition of Care [facility]) - Progression Note    Patient Details  Name: Adam [name] MRN: [id number] Date of Birth: 24-Oct-1961  Transition of Care [facility]) CM/SW Contact  Joseph Art, [place] Phone Number: 09/08/2021, 10:38 AM  Clinical Narrative:     CSW spoke with Toby Intake Coordinator at [facility]. Toby stated he would email this CSW the referral form to begin potential placement process.  Expected Discharge Plan: Group Home Barriers to Discharge: Continued Medical Work up  Expected Discharge Plan and Services Expected Discharge Plan: Group Home In-house Referral: Clinical Social Work     Living arrangements for the past 2 months: Group Home                                       Social Determinants of Health (SDOH) Interventions    Readmission Risk Interventions No flowsheet data found.

## 2021-09-09 NOTE — ED Notes (Signed)
Lunch tray given. 

## 2021-09-09 NOTE — ED Notes (Signed)
RN called pt's mother for pt to speak with her.

## 2021-09-09 NOTE — ED Notes (Signed)
Pt standing in day room; calm, cooperative. Pt states "feeling yeah feeling yes please." Pt denies pain and SI/HI at this time. Pt denies visual hallucinations but reports auditory hallucinations. Pt states "no" when asked if he can verbalize what the voices say. Pt states "yeah" when asked if he slept good and says "yeah yeah" when asked if he is eating good. Pt denies anxiety and depression at this time. No acute distress noted.

## 2021-09-09 NOTE — ED Notes (Signed)
Pt pacing the unit and banging on windows and doors.  Pt is becoming more difficult to re-direct.

## 2021-09-09 NOTE — ED Notes (Addendum)
Pt continues coming to door knocking hard saying "[place]" while holding a blank sheet of paper. Pt is very aggravated that staff does not understand what he is saying or needing. Pt hits window in door at times.

## 2021-09-09 NOTE — ED Notes (Signed)
VOL/pending placement 

## 2021-09-09 NOTE — ED Provider Notes (Signed)
Emergency Medicine Observation Re-evaluation Note  Rondey Fallen is a 60 y.o. male, seen on rounds today.  Pt initially presented to the ED for complaints of Psychiatric Evaluation Currently, the patient is sleeping.  Physical Exam  BP (!) 125/106 (BP Location: Left Arm)   Pulse (!) 101   Temp 97.7 F (36.5 C) (Oral)   Resp 18   Ht 5\' 8"  (1.727 m)   Wt 79.4 kg   SpO2 97%   BMI 26.61 kg/m  Physical Exam General: sleeping comfortably Lungs: no resp distress Psych: calm  ED Course / MDM  EKG:EKG Interpretation  Date/Time:  Monday August 28 2021 20:12:52 EDT Ventricular Rate:  117 PR Interval:  162 QRS Duration: 104 QT Interval:  306 QTC Calculation: 426 R Axis:   -17 Text Interpretation: Sinus tachycardia Otherwise normal ECG Confirmed by UNCONFIRMED, DOCTOR (08-25-1992), editor [id number], Tammy [phone]) on 08/29/2021 8:10:23 AM  I have reviewed the labs performed to date as well as medications administered while in observation.  Recent changes in the last 24 hours include none.  Plan  Current plan is for SW placement.  Wiley Magan is not under involuntary commitment.     [name], MD 09/09/21 [phone]

## 2021-09-09 NOTE — ED Notes (Signed)
Pt agreed to take a shower. Staff put supplies in floor on top of chuck. Staff provide a chair for pt to sit in so he would not fall. Staff checked on pt frequently by standing outside of door. Pt would not allow staff to assist him. However, pt did not use soap or his towels to dry off. Pt does however have clean scrub set  on and clean underwear and socks. PT did allow staff tech to put deodorant on him after his shower.  Pt is in dayroom listening to music and will knock frequently on the door. Staff tries to assist at figuring out his needs at the time.

## 2021-09-09 NOTE — ED Notes (Signed)
Breakfast tray given. °

## 2021-09-10 MED ORDER — DIVALPROEX SODIUM 500 MG PO DR TAB
500.0000 mg | DELAYED_RELEASE_TABLET | Freq: Two times a day (BID) | ORAL | Status: DC
  Administered 2021-09-10 – 2021-11-26 (×148): 500 mg via ORAL
  Filled 2021-09-10 (×156): qty 1

## 2021-09-10 NOTE — ED Notes (Signed)
Patient became agitated while getting his VS done. He took of the cuff and yell " everybody, everybody" and went to is room.

## 2021-09-10 NOTE — ED Notes (Signed)
Pt repeatedly comes to the door, slamming his hands on the door, repeatedly saying "complete"

## 2021-09-10 NOTE — ED Notes (Signed)
Hourly rounding reveals patient in room. No complaints, stable, in no acute distress. Q15 minute rounds and monitoring via Security Cameras to continue. 

## 2021-09-10 NOTE — ED Notes (Signed)
Pt refused to take lunch tray

## 2021-09-10 NOTE — ED Provider Notes (Signed)
Emergency Medicine Observation Re-evaluation Note  Adam [name] is a 60 y.o. male, seen on rounds today.  Pt initially presented to the ED for complaints of Psychiatric Evaluation Currently, the patient is sleeping.  Physical Exam  BP (!) 150/111 (BP Location: Right Arm)   Pulse 96   Temp 98.2 F (36.8 C) (Oral)   Resp 18   Ht 5\' 8"  (1.727 m)   Wt 79.4 kg   SpO2 97%   BMI 26.61 kg/m  Physical Exam General: Calm and cooperative. Cardiac: Good peripheral perfusion. Lungs: Normal respiratory effort. Psych: Sleeping.  ED Course / MDM    I have reviewed the labs performed to date as well as medications administered while in observation.  There have been no changes to his status in the last 24 hours.  Plan  Current plan is for social work placement.  Adam [name] is not under involuntary commitment.     [name], MD 09/10/21 [phone]

## 2021-09-10 NOTE — ED Notes (Signed)
Report to include Situation, Background, Assessment, and Recommendations received from [facility]. Patient alert and oriented, warm and dry, in no acute distress. Patient refused to communicate with this Clinical research associate and tech. Unable to assess SI, HI, AVH and pain. Patient made aware of Q15 minute rounds and security cameras for their safety. Patient instructed to come to me with needs or concerns.

## 2021-09-11 NOTE — ED Notes (Signed)
Pt still refusing VS.  Cooperative with medications.

## 2021-09-11 NOTE — ED Notes (Signed)
Clean pt. Room and changed linen.

## 2021-09-11 NOTE — ED Notes (Signed)
Pt is sound asleep in his bed.

## 2021-09-11 NOTE — ED Notes (Signed)
Hourly rounding reveals patient in room. No complaints, stable, in no acute distress. Q15 minute rounds and monitoring via Security Cameras to continue. 

## 2021-09-11 NOTE — ED Notes (Signed)
RN called pt's mother to help reduce agitation.  Pt speaking on phone now.

## 2021-09-11 NOTE — ED Provider Notes (Signed)
Emergency Medicine Observation Re-evaluation Note  Adam [name] is a 60 y.o. male, seen on rounds today.  Pt initially presented to the ED for complaints of Psychiatric Evaluation Currently, the patient is resting comfortably in bed.  Physical Exam  BP (!) 131/97 (BP Location: Right Arm)   Pulse 95   Temp 97.7 F (36.5 C) (Oral)   Resp 18   Ht 5\' 8"  (1.727 m)   Wt 79.4 kg   SpO2 95%   BMI 26.61 kg/m  Physical Exam Constitutional:      Appearance: He is not ill-appearing or toxic-appearing.  HENT:     Head: Atraumatic.  Cardiovascular:     Comments: Appears well perfused Pulmonary:     Effort: Pulmonary effort is normal.  Abdominal:     General: There is no distension.  Musculoskeletal:        General: No deformity.  Neurological:     General: No focal deficit present.     ED Course / MDM  EKG:EKG Interpretation  Date/Time:  Monday August 28 2021 20:12:52 EDT Ventricular Rate:  117 PR Interval:  162 QRS Duration: 104 QT Interval:  306 QTC Calculation: 426 R Axis:   -17 Text Interpretation: Sinus tachycardia Otherwise normal ECG Confirmed by UNCONFIRMED, DOCTOR (08-25-1991), editor [id number], Tammy [phone]) on 08/29/2021 8:10:23 AM  I have reviewed the labs performed to date as well as medications administered while in observation.  Recent changes in the last 24 hours include none.  Plan  Current plan is for placement.  Adam [name] is not under involuntary commitment.     [name], MD 09/11/21 [phone]

## 2021-09-11 NOTE — ED Notes (Signed)
This nurse spoke to Dr. Fuller Plan to update on situation. Pt continues to escalate with no success of deescalating. Requested Dr. Marily Memos guidance on situation, informed her of PRN available and night medications that are scheduled. Dr. Fuller Plan states to five scheduled and evaluate after. Will continue to monitor.

## 2021-09-11 NOTE — ED Notes (Addendum)
Hourly rounding reveals patient in room. No complaints, stable, in no acute distress. Q15 minute rounds and monitoring via Security Cameras to continue. 

## 2021-09-11 NOTE — ED Notes (Signed)
Pt has went into another room in ED and removed the sheets from the bed, room is vacant. Pt is now stripping his bed of linens and doing things in his room with them. Will continue to monitor.

## 2021-09-11 NOTE — ED Notes (Signed)
Patient woke up agitated and wanted to leave. Banging doors and trying to open the doors. He had forced medication day shift. This Clinical research associate and Tech went and changed his sheets and blankets. He seems to calm down for now. Continue to monitor safety.

## 2021-09-11 NOTE — ED Notes (Signed)
Pt has gradually calmed and is more friendly at this time. Will continue to monitor.

## 2021-09-11 NOTE — ED Notes (Signed)
Pt is agitated, pt yelling shower, surprise, no, and other phrases on arrival. Attempt to speak to pt but no success

## 2021-09-11 NOTE — ED Notes (Signed)
Report received from Amy, English as a second language teacher. Patient agitated, warm and dry, in no acute distress.  Patient made aware of Q15 minute rounds and security cameras for their safety. Patient instructed to come to this nurse with needs or concerns.

## 2021-09-11 NOTE — ED Notes (Signed)
Pt remains agitated, continues to hit and yell. Pt has attempted to pull chair from day room to other area, required communication with pt for that. Pt attempted to throw chair toward nurse when told chair has to stay but he was unable to pick it up. Pt pacing from one spot to next, pt continues to point into room where nurses station is but there is nothing in this room at the time pt can have. Attempting to communicate this with patient. Attempt to have patient use remote but this is not what pt needs. Continues to be agitated. Will continue to monitor.

## 2021-09-11 NOTE — ED Notes (Signed)
Pt is sleeping

## 2021-09-11 NOTE — TOC Progression Note (Signed)
Transition of Care [facility]) - Progression Note    Patient Details  Name: Adam [name] MRN: [id number] Date of Birth: March 17, 1961  [facility]) CM/SW Contact  Adam [name] Phone Number: [phone] 09/11/2021, 1:18 PM  Clinical Narrative:     CSW received email from Adam [name] w/ [facility] [phone], stating they are currently working with Adam [name] at [facility] to submit a budget request for possible placement.  Mr. Adam [name] will also sent update on when there is immediate bed availability in one of their facilities. Mr. Adam [name] has also applied to [facility] and [facility] for possible placement. Both facilities have extensive waiting lists. Mr. Adam [name] has requested medical records information form patient's mother/HCPOA Adam [name] [phone], which will assist in applying for TBI and IDD related services and increased funding.  Mr. Adam [name] stated he would update this CSW when he had new information to share. No placement offers at this time.  Expected Discharge Plan: Group Home Barriers to Discharge: Continued Medical Work up  Expected Discharge Plan and Services Expected Discharge Plan: Group Home In-house Referral: Clinical Social Work     Living arrangements for the past 2 months: Group Home                                       Social Determinants of Health (SDOH) Interventions    Readmission Risk Interventions No flowsheet data found.

## 2021-09-11 NOTE — ED Notes (Signed)
RN spoke with pt's mother about pt's agitation.  Ms. Mcloud stated she would play music the pt enjoys over the phone.  Pt on the phone now.

## 2021-09-11 NOTE — ED Notes (Signed)
VS not taken, patient asleep 

## 2021-09-11 NOTE — ED Notes (Signed)
Allowed pt to speak to mother in hopes that it would calm pt down. Pt was calm on phone after nurse dialed it for him. Immedialy after conversation pt returns to original state. Cuts nurse off and does not let this nurse back to nurses station, beats on door, yanks on handle. Yelling and agitated. Will continue to monitor.

## 2021-09-11 NOTE — ED Notes (Signed)
Afternoon medications given per Dr. Toni Amend  *morning medications had been given late

## 2021-09-12 MED ORDER — DIPHENHYDRAMINE HCL 50 MG/ML IJ SOLN
50.0000 mg | Freq: Four times a day (QID) | INTRAMUSCULAR | Status: DC | PRN
  Administered 2021-09-12 – 2021-11-19 (×20): 50 mg via INTRAMUSCULAR
  Filled 2021-09-12 (×27): qty 1

## 2021-09-12 MED ORDER — HALOPERIDOL LACTATE 5 MG/ML IJ SOLN
5.0000 mg | Freq: Four times a day (QID) | INTRAMUSCULAR | Status: DC | PRN
  Administered 2021-09-12 – 2021-11-20 (×16): 5 mg via INTRAMUSCULAR
  Filled 2021-09-12 (×17): qty 1

## 2021-09-12 NOTE — ED Notes (Signed)
RN turned dayroom TV to music channel to help calm patient.

## 2021-09-12 NOTE — ED Notes (Addendum)
Pt standing at nurse's station door repeating the same unintelligible phrase.  Pt is difficult to re-direct away from the door.

## 2021-09-12 NOTE — ED Notes (Signed)
Pt stood at [facility] port door for 10 minutes knocking on door. This nurse and security attempted to converse with pt and redirect. Pt did not respond at all, only kept yelling "gahhhh" and "no". Attempting to distance from patient to see if less stimulation will have pt stop and calm. No interventions have been successful through night.

## 2021-09-12 NOTE — ED Notes (Signed)
15 minute round--pt is asleep 

## 2021-09-12 NOTE — ED Notes (Signed)
On arrival pt is agitated, beating on door and tugging on handles. Pt yelling, singing, pacing. Pt will not speak to this nurse. Ignores this nurse each time speaking to patient

## 2021-09-12 NOTE — ED Notes (Signed)
Report received from Amy, English as a second language teacher. Patient alert and oriented, warm and dry, in no acute distress.  Patient made aware of Q15 minute rounds and security cameras for their safety. Patient instructed to come to this nurse with needs or concerns.

## 2021-09-12 NOTE — ED Notes (Signed)
Pt had been in good mood and very cooperative. Suddenly pt seems to flip a switch and is agitated, knocking on door with no relief. Pt continually repeats "[place]" and when staff continues to be unable to decipher what he means he yells "oh my god" and keeps hitting the door more. Have attempted to provide several interventions for pt but none are success so far

## 2021-09-12 NOTE — ED Notes (Signed)
Pt to door with empty cup of grape juice, implies he wants more. Juice given to pt

## 2021-09-12 NOTE — ED Notes (Signed)
Meal tray given 

## 2021-09-12 NOTE — ED Notes (Signed)
Pt given snack and sprite. ?

## 2021-09-12 NOTE — ED Notes (Signed)
Pt continues to walk around the unit. No distress noted.

## 2021-09-12 NOTE — ED Notes (Signed)
Pt is more calm, but continues to pace around the unit.  Pt has not slept despite IM medications.

## 2021-09-12 NOTE — ED Provider Notes (Signed)
Emergency Medicine Observation Re-evaluation Note  Adam [name] is a 60 y.o. male, seen on rounds today.  Pt initially presented to the ED for complaints of Psychiatric Evaluation Currently, the patient is resting comfortably.  Physical Exam  BP (!) 106/91 (BP Location: Right Arm)   Pulse (!) 106   Temp 98.7 F (37.1 C) (Oral)   Resp 18   Ht 5\' 8"  (1.727 m)   Wt 79.4 kg   SpO2 100%   BMI 26.61 kg/m  Physical Exam General: No acute distress Lungs: No respiratory distress Psych: Cooperative  ED Course / MDM  EKG:EKG Interpretation  Date/Time:  Monday August 28 2021 20:12:52 EDT Ventricular Rate:  117 PR Interval:  162 QRS Duration: 104 QT Interval:  306 QTC Calculation: 426 R Axis:   -17 Text Interpretation: Sinus tachycardia Otherwise normal ECG Confirmed by UNCONFIRMED, DOCTOR (08-25-1991), editor [id number], Tammy [phone]) on 08/29/2021 8:10:23 AM  I have reviewed the labs performed to date as well as medications administered while in observation.  Recent changes in the last 24 hours include patient did require IM medication for agitation ordered by Dr. 08/31/2021.  Plan  Current plan is for placement by social work.  Adam [name] is not under involuntary commitment.     Adam Sharps, MD 09/12/21 [phone]

## 2021-09-12 NOTE — ED Notes (Signed)
Pt agitated, banging on nurse's station door and yelling.  Difficult to re-direct and has been given all scheduled and PRN medications.

## 2021-09-12 NOTE — ED Notes (Signed)
Pt has continued to show agitation without any redirection or deescalation. Pt has PRN IM for this, this nurse starts to prepare injection. While mixing injection to prepare, pt goes into room 4 and lays on bed. Pt has continued to lay and is sleeping now. Medication to be held prepared, will evaluate should pt get back up.

## 2021-09-12 NOTE — ED Notes (Signed)
Pt is yelling and hitting windows while pacing. "The doctor knows no way, the doctor knows no way" "police" "No way"

## 2021-09-12 NOTE — ED Notes (Signed)
VOL/Pending placement 
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
## 2021-09-12 NOTE — ED Notes (Signed)
15 minute round- pt is awake.

## 2021-09-12 NOTE — ED Notes (Signed)
Pt asleep at this time, unable to collect vitals. Will collect pt vitals once awake. 

## 2021-09-12 NOTE — ED Notes (Signed)
Pt now suddenly elated. Requests germ-x to clean hands, provided

## 2021-09-12 NOTE — ED Notes (Addendum)
15 minute round--pt is asleep 

## 2021-09-13 NOTE — ED Notes (Signed)
Hospital meal provided.  100% consumed, pt tolerated w/o complaints.  Waste discarded appropriately.   

## 2021-09-13 NOTE — ED Notes (Signed)

## 2021-09-13 NOTE — ED Notes (Signed)
Pt keeps coming to the nurses station and screams "dog" and banging the door with his hand aggressively

## 2021-09-13 NOTE — ED Notes (Signed)
15 minute round--pt was asleep

## 2021-09-13 NOTE — ED Notes (Signed)
Unlocked bathroom door to allow patient to use restroom. Pt exited and bathroom door locked behind patient by staff.  

## 2021-09-13 NOTE — ED Notes (Signed)
Patient beating on door screaming anger like.  Patient redirected multiple times by this Clinical research associate and other staff. Patient continues behaviors.

## 2021-09-13 NOTE — ED Notes (Signed)
Patient provided snack at appropriate snack time.  Pt consumed 100% of snack provided, tolerated well w/o complaints   Trash disposted of appropriately by patient.  

## 2021-09-13 NOTE — ED Notes (Signed)
Snack was given, pt did not eat at this time

## 2021-09-13 NOTE — Progress Notes (Signed)
Visit with long term behavior patient while rounding. Presented him with a calming presence. No coherent conversation but was able to convince him to sit for a good amount of time which is not common for him pacing daily.

## 2021-09-13 NOTE — ED Notes (Signed)
VOL/pending placement 
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
## 2021-09-13 NOTE — ED Notes (Signed)
Patient continues to bang on door and scream "Dog" at staff. Patient continues to be redirected by Clinical research associate and security.

## 2021-09-13 NOTE — ED Provider Notes (Signed)
Emergency Medicine Observation Re-evaluation Note  Adam [name] is a 60 y.o. male, seen on rounds today.  Pt initially presented to the ED for complaints of Psychiatric Evaluation Currently, the patient is ambulating around the day room with no complaints.  Physical Exam  BP 106/80 (BP Location: Right Arm)   Pulse 80   Temp 97.7 F (36.5 C) (Oral)   Resp 18   Ht 5\' 8"  (1.727 m)   Wt 79.4 kg   SpO2 99%   BMI 26.61 kg/m  Physical Exam General: Comfortable appearing. Cardiac: Good peripheral perfusion Lungs: Normal respiratory effort. Psych: Slightly anxious appearing but cooperative.  ED Course / MDM   I have reviewed the labs performed to date as well as medications administered while in observation.  There have been no significant events in last 24 hours.  Plan  Current plan is for placement by social work.  Adam [name] is not under involuntary commitment.     [name], MD 09/13/21 [phone]

## 2021-09-13 NOTE — ED Notes (Signed)
VOL, pending placement 

## 2021-09-13 NOTE — ED Notes (Signed)
Pt asleep at this time, unable to collect vitals. Will collect pt vitals once awake. 

## 2021-09-13 NOTE — ED Notes (Addendum)
Pt was redirected to his room multiply times but keeps banging the door and screaming.

## 2021-09-13 NOTE — ED Notes (Signed)
IM medications given by this Clinical research associate with security staff standing by for Production assistant, radio and patient. IM given with no issues.

## 2021-09-14 NOTE — ED Notes (Signed)
Unable to obtain vitals due to patient sleeping. Will continue to monitor.   

## 2021-09-14 NOTE — ED Notes (Signed)
Report to include Situation, Background, Assessment, and Recommendations received from Allyson RN. Patient alert and oriented, warm and dry, in no acute distress. Patient denies SI, HI, AVH and pain. Patient made aware of Q15 minute rounds and security cameras for their safety. Patient instructed to come to me with needs or concerns. ? ?

## 2021-09-14 NOTE — ED Notes (Signed)
Patient provided snack at appropriate snack time.  Pt consumed 100% of snack provided, tolerated well w/o complaints   Trash disposted of appropriately by patient.  

## 2021-09-14 NOTE — ED Provider Notes (Signed)
Emergency Medicine Observation Re-evaluation Note  Adam [name] is a 60 y.o. male, seen on rounds today.  Pt initially presented to the ED for complaints of Psychiatric Evaluation Currently, the patient is sleeping in bed, denies complaints when woken.  Physical Exam  BP (!) 119/93 (BP Location: Right Arm)   Pulse (!) 102   Temp 98.1 F (36.7 C) (Oral)   Resp 18   Ht 5\' 8"  (1.727 m)   Wt 79.4 kg   SpO2 100%   BMI 26.61 kg/m  Physical Exam Constitutional: Resting comfortably. Eyes: Conjunctivae are normal. Head: Atraumatic. Nose: No congestion/rhinnorhea. Mouth/Throat: Mucous membranes are moist. Neck: Normal ROM Cardiovascular: No cyanosis noted. Respiratory: Normal respiratory effort. Gastrointestinal: Non-distended. Genitourinary: deferred Musculoskeletal: No lower extremity tenderness nor edema. Neurologic:  Normal speech and language. No gross focal neurologic deficits are appreciated. Skin:  Skin is warm, dry and intact. No rash noted.   ED Course / MDM  EKG:EKG Interpretation  Date/Time:  Monday August 28 2021 20:12:52 EDT Ventricular Rate:  117 PR Interval:  162 QRS Duration: 104 QT Interval:  306 QTC Calculation: 426 R Axis:   -17 Text Interpretation: Sinus tachycardia Otherwise normal ECG Confirmed by UNCONFIRMED, DOCTOR (08-25-1991), editor [id number], Tammy [phone]) on 08/29/2021 8:10:23 AM  I have reviewed the labs performed to date as well as medications administered while in observation.  Recent changes in the last 24 hours include none.  Plan  Current plan is for placement per social work.  Jeromy Borcherding is not under involuntary commitment.     [name], MD 09/14/21 [phone]

## 2021-09-14 NOTE — ED Notes (Signed)
Pt given lunch tray in room 

## 2021-09-14 NOTE — ED Notes (Signed)
Hourly rounding reveals patient in room. No complaints, stable, in no acute distress. Q15 minute rounds and monitoring via Security Cameras to continue. 

## 2021-09-14 NOTE — ED Notes (Signed)
Snack and beverage given. 

## 2021-09-14 NOTE — ED Notes (Signed)
Hospital meal provided.  100% consumed, pt tolerated w/o complaints.  Waste discarded appropriately.   

## 2021-09-15 MED ORDER — HALOPERIDOL LACTATE 5 MG/ML IJ SOLN
INTRAMUSCULAR | Status: AC
  Filled 2021-09-15: qty 2

## 2021-09-15 NOTE — ED Notes (Signed)
Hourly rounding reveals patient in room. No complaints, stable, in no acute distress. Q15 minute rounds and monitoring via Security Cameras to continue. 

## 2021-09-15 NOTE — TOC Progression Note (Signed)
Transition of Care [facility]) - Progression Note    Patient Details  Name: Adam [name] MRN: [id number] Date of Birth: 17-Jan-1961  Transition of Care [facility]) CM/SW Contact  Joseph Art, [place] Phone Number: 09/15/2021, 4:29 PM  Clinical Narrative:     Possible placement at [facility], pending referral assessment.  [name] w/ [facility] [phone] will update this CSW with new information, once it becomes available.  Expected Discharge Plan: Group Home Barriers to Discharge: Continued Medical Work up  Expected Discharge Plan and Services Expected Discharge Plan: Group Home In-house Referral: Clinical Social Work     Living arrangements for the past 2 months: Group Home                                       Social Determinants of Health (SDOH) Interventions    Readmission Risk Interventions No flowsheet data found.
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
## 2021-09-15 NOTE — ED Notes (Signed)
Pt no longer banging on doors and screaming. Continues to pace dayroom  Provided dinner tray

## 2021-09-15 NOTE — ED Notes (Signed)
Pt banging on door, appears to be asking for discharge, difficult to understand.  Attempted to provide lunch tray and water, pt refused and began screaming. Tried to put tray in room, pt started screaming no.

## 2021-09-15 NOTE — ED Notes (Signed)
Pt repeatedly coming to door and knocking on it. Does not express specific needs.

## 2021-09-15 NOTE — ED Notes (Signed)
Pt at window to nurses desk interacting with other patients. Has calmed down from earlier

## 2021-09-15 NOTE — ED Notes (Signed)
Pt back to room. Attempted to give pt lunch tray, repeatedly says no. Refuses to give phone back

## 2021-09-15 NOTE — ED Notes (Signed)
Pt continuously banging on door and wall. Taken IM injection voluntarily. After injection, pt became irritable and followed this RN to door and tried to come into nurses station. Security attempting to deescalate.

## 2021-09-15 NOTE — ED Notes (Signed)
Pt banging on door asking for food, explained lunch will be given to pt when brought by cafeteria

## 2021-09-15 NOTE — ED Notes (Signed)
Pt to door speaking to nurse and security. Having difficulty understanding pt and needs expressing. Pt frustrating and shouting incomprehensible sentences

## 2021-09-15 NOTE — ED Notes (Signed)
Patient became agitated and started disrupting the unit. He wanted to leave when he saw other patient leave. Officer talked to him and he calmed down. He went to his bed and laid down. EDP notified.

## 2021-09-15 NOTE — ED Notes (Signed)
VOl pending placement 

## 2021-09-15 NOTE — ED Notes (Signed)
Pt provided hand sanitizer

## 2021-09-15 NOTE — ED Notes (Signed)
Report to dawn, rn.  

## 2021-09-15 NOTE — ED Notes (Signed)
Pt accepted lunch tray

## 2021-09-15 NOTE — ED Notes (Signed)
VOL/Pending Placement 

## 2021-09-15 NOTE — ED Notes (Signed)
Pt provided phone with mother number dialed

## 2021-09-15 NOTE — ED Notes (Signed)
Bathroom door opened by [place] NT for pt to use restroom. Locked afterwards

## 2021-09-15 NOTE — ED Provider Notes (Signed)
Emergency Medicine Observation Re-evaluation Note  Adam [name] is a 60 y.o. male, seen on rounds today. No events overnight  Physical Exam  BP (!) 141/96 (BP Location: Right Arm)   Pulse 82   Temp 98.1 F (36.7 C) (Oral)   Resp 16   Ht 1.727 m (5\' 8" )   Wt 79.4 kg   SpO2 98%   BMI 26.61 kg/m  Physical Exam General: No complaint  Lungs: No increased work of breathing Psych: Calm and  ED Course / MDM  No acute events overnight  Plan  TOC is working on disposition  Adam [name] is not under involuntary commitment.     [name], MD 09/15/21 [phone]
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
## 2021-09-15 NOTE — ED Notes (Signed)
VS not taken, patient asleep 

## 2021-09-15 NOTE — ED Notes (Signed)
Pt sitting in dayroom dialing numbers on phone. This RN attempted to help pt dial numbers, declined. Pt refuses to sit in chair or give phone back

## 2021-09-16 NOTE — ED Notes (Signed)
Hourly rounding reveals patient in room. No complaints, stable, in no acute distress. Q15 minute rounds and monitoring via Security Cameras to continue. 

## 2021-09-16 NOTE — ED Provider Notes (Signed)
Emergency Medicine Observation Re-evaluation Note  Adam [name] is a 60 y.o. male, evaluated on rounds today. No events overnight.  Patient is currently sleeping without distress.  Checking with nurse Adam [name], advises no issues.  Physical Exam  BP (!) 136/98 (BP Location: Right Arm)   Pulse (!) 102   Temp (!) 97.5 F (36.4 C) (Oral)   Resp 20   Ht 5\' 8"  (1.727 m)   Wt 79.4 kg   SpO2 100%   BMI 26.61 kg/m  Physical Exam General: No complaint Lungs: No increased work of breathing.  Resting comfortably in bed. Psych: Calm   ED Course / MDM  No acute events overnight  Plan  TOC is working on disposition  Adam [name] is not under involuntary commitment.   [name], MD 09/16/21 [phone]

## 2021-09-16 NOTE — ED Notes (Signed)
Report to include Situation, Background, Assessment, and Recommendations received from [facility]. Patient alert and oriented, warm and dry, in no acute distress. UTA SI, HI, AVH and pain due to patient incomprehensible communication . Patient made aware of Q15 minute rounds and security cameras for their safety. Patient instructed to come to me with needs or concerns.

## 2021-09-16 NOTE — ED Notes (Signed)
Breakfast tray provided. 

## 2021-09-16 NOTE — ED Notes (Signed)
Report received, care of pt assumed.  Pt resting quietly on bed, eyes closed, respirations even and nonlabored.  Vital signed deferred at this time, will obtain when pt wakes

## 2021-09-16 NOTE — ED Notes (Signed)
To unit to give pt afternoon medications and VS.  Pt walked away from nurse saying "no, no, no".  After multiple attempts by this RN, Dr. Toni Amend talked with pt and pt agreed to take medications.  Pt continued to refuse VS at this time.

## 2021-09-16 NOTE — ED Notes (Signed)
Snack and beverage given. 

## 2021-09-16 NOTE — ED Notes (Signed)
VOL/pending placement 

## 2021-09-17 NOTE — ED Notes (Signed)
Pt banging on nurse's station door.  Pt able to say "phone."  RN dialed pt's mother's number to help reduce pt's agitation/ frustration.

## 2021-09-17 NOTE — ED Notes (Signed)
Pt's hair combed by NT.

## 2021-09-17 NOTE — ED Notes (Signed)
Hourly rounding reveals patient in room. No complaints, stable, in no acute distress. Q15 minute rounds and monitoring via Security Cameras to continue. 

## 2021-09-17 NOTE — ED Notes (Signed)
Pt has taken his mattress off the bed and put in on the floor in front of his room.

## 2021-09-17 NOTE — ED Notes (Signed)
Pt took scheduled medications without incident.  RN did not see any pills left in pt's mouth.

## 2021-09-17 NOTE — ED Notes (Signed)
Pt becoming agitated after seeing wheelchair and pt belonging bag in sally port.  Pt yelling and banging on doors and windows.

## 2021-09-17 NOTE — ED Notes (Signed)
Breakfast tray given. °

## 2021-09-17 NOTE — ED Notes (Signed)
Pt took scheduled medication without incident.  No pills seen in pt's mouth by this Clinical research associate.

## 2021-09-17 NOTE — ED Notes (Signed)
Report to include Situation, Background, Assessment, and Recommendations received from Amy RN. Patient alert and oriented, warm and dry, in no acute distress. Patient denies SI, HI, AVH and pain. Patient made aware of Q15 minute rounds and security cameras for their safety. Patient instructed to come to me with needs or concerns.  

## 2021-09-17 NOTE — ED Notes (Signed)
Pt up to bathroom.  Door locked after pt finished.

## 2021-09-17 NOTE — ED Notes (Signed)
RN dialed pt's mom's number for him to speak with her.

## 2021-09-17 NOTE — ED Notes (Signed)
Pt yelling at staff in nurse's station and banging on window.

## 2021-09-17 NOTE — ED Notes (Signed)
Pt came to the door and pointed to the remote. NT lead pt back to room and assisted pt with changing the channel.

## 2021-09-17 NOTE — ED Notes (Signed)
Pt has placed his mattress back on his bed.

## 2021-09-17 NOTE — ED Notes (Signed)
Pt pointed to the restroom door. Security unlocked bathroom door.

## 2021-09-17 NOTE — ED Notes (Signed)
This Clinical research associate and NT change the ben linen and clean out the trash.

## 2021-09-17 NOTE — ED Notes (Signed)
VOL, pending placement 

## 2021-09-17 NOTE — ED Notes (Addendum)
Pt fixated with shower door.  Pt then coming to nurse's station door and getting loud.  Pt given shower supplies and allowed to shower early to help reduce pt's agitation.  NT washed pt's hair and helped him wash his body.  RN present for assistance and safety.

## 2021-09-17 NOTE — ED Notes (Signed)
VS not taken, patient asleep 

## 2021-09-17 NOTE — ED Notes (Signed)
Pt seen on camera in BHU 4 (room was empty).  Pt was trying to take the mattress out of the room.  Pt redirected by security.  Mattress returned to bed.

## 2021-09-17 NOTE — ED Notes (Signed)
During 15 minute rounding pt was walking around the dayroom.

## 2021-09-17 NOTE — ED Provider Notes (Signed)
Emergency Medicine Observation Re-evaluation Note  Adam [name] is a 60 y.o. male, seen on rounds today.  Pt initially presented to the ED for complaints of Psychiatric Evaluation Currently, the patient is sitting in his room.  Physical Exam  BP (!) 152/98 (BP Location: Right Arm)   Pulse 81   Temp 98 F (36.7 C) (Oral)   Resp 16   Ht 5\' 8"  (1.727 m)   Wt 79.4 kg   SpO2 99%   BMI 26.61 kg/m  Physical Exam Constitutional:      Appearance: He is not ill-appearing or toxic-appearing.  HENT:     Head: Atraumatic.  Cardiovascular:     Comments: Appears well perfused Pulmonary:     Effort: Pulmonary effort is normal.  Abdominal:     General: There is no distension.  Musculoskeletal:        General: No deformity.  Neurological:     General: No focal deficit present.     ED Course / MDM  EKG:EKG Interpretation  Date/Time:  Monday August 28 2021 20:12:52 EDT Ventricular Rate:  117 PR Interval:  162 QRS Duration: 104 QT Interval:  306 QTC Calculation: 426 R Axis:   -17 Text Interpretation: Sinus tachycardia Otherwise normal ECG Confirmed by UNCONFIRMED, DOCTOR (08-25-1991), editor [id number], Tammy [phone]) on 08/29/2021 8:10:23 AM  I have reviewed the labs performed to date as well as medications administered while in observation.  Recent changes in the last 24 hours include none.  Plan  Current plan is for placement.  Adam [name] is not under involuntary commitment.     [name], MD 09/17/21 [phone]

## 2021-09-17 NOTE — ED Notes (Signed)
Meal tray given 

## 2021-09-17 NOTE — ED Notes (Signed)
Empty trays and cups removed from pt's room.

## 2021-09-18 NOTE — ED Notes (Signed)
Patient became agitated when psychiatrist entered unit d/t MD seeing another patient that wasn't him. Patient tried to enter another patient's room and tried to charge towards MD. Then charged towards security member. Patient was held for medication hold from 1440-1450. Security attempted to let patient out of medication hold at 1445 just after medication, but patient began to get combative again. Patient released at 1450 without incidence.

## 2021-09-18 NOTE — ED Notes (Signed)
VOL pending placement see note 

## 2021-09-18 NOTE — ED Notes (Signed)
Hourly rounding reveals patient in room. No complaints, stable, in no acute distress. Q15 minute rounds and monitoring via Security Cameras to continue. 

## 2021-09-18 NOTE — TOC Progression Note (Signed)
Transition of Care [name]) - Progression Note    Patient Details  Name: Adam [name] MRN: [id number] Date of Birth: [name] 07, 1961  [facility]) CM/SW Contact  Joseph Art, [place] Phone Number: 09/18/2021, 4:24 PM  Clinical Narrative:     CSW received email from [facility] w/ [facility], w/ request for Donna Bernard and Adan Sis to meet with the patient on 09/25/2021 for assessment for possible placement.  CSW requested a confirmation for time.  Dickie La stated they will update me when they decide.   Expected Discharge Plan: Group Home Barriers to Discharge: Continued Medical Work up  Expected Discharge Plan and Services Expected Discharge Plan: Group Home In-house Referral: Clinical Social Work     Living arrangements for the past 2 months: Group Home                                       Social Determinants of Health (SDOH) Interventions    Readmission Risk Interventions No flowsheet data found.

## 2021-09-18 NOTE — ED Notes (Signed)
VOL/pending placement 

## 2021-09-18 NOTE — ED Notes (Signed)
VS not taken, patient asleep 

## 2021-09-18 NOTE — ED Notes (Signed)
Lunch tray given. 

## 2021-09-18 NOTE — ED Provider Notes (Signed)
Emergency Medicine Observation Re-evaluation Note  Adam [name] is a 60 y.o. male, seen on rounds today.  Pt initially presented to the ED for complaints of Psychiatric Evaluation Currently, the patient is currently resting.  Physical Exam  BP 115/62   Pulse (!) 108   Temp 98.3 F (36.8 C) (Oral)   Resp 17   Ht 5\' 8"  (1.727 m)   Wt 79.4 kg   SpO2 99%   BMI 26.61 kg/m  Physical Exam General: NAD Cardiac: well perfused Lungs: unlabored Psych: currently calm  ED Course / MDM  EKG:EKG Interpretation  Date/Time:  Monday August 28 2021 20:12:52 EDT Ventricular Rate:  117 PR Interval:  162 QRS Duration: 104 QT Interval:  306 QTC Calculation: 426 R Axis:   -17 Text Interpretation: Sinus tachycardia Otherwise normal ECG Confirmed by UNCONFIRMED, DOCTOR (08-25-1991), editor [id number], Tammy [phone]) on 08/29/2021 8:10:23 AM  I have reviewed the labs performed to date as well as medications administered while in observation.  Recent changes in the last 24 hours include .  Plan  Current plan is for psych/soc.  Adam [name] is not under involuntary commitment.     [name], MD 09/18/21 [phone]

## 2021-09-18 NOTE — ED Notes (Signed)
Pt given dinner tray.

## 2021-09-18 NOTE — ED Notes (Signed)
Snack and drink given 

## 2021-09-18 NOTE — ED Notes (Signed)
Patient currently refusing medications or breakfast. Breakfast placed in room. Will attempt to give medications at later time.

## 2021-09-18 NOTE — ED Notes (Signed)
Patient took medications after writer had to encourage patient. Patient continues to bang on nursing window and door. Patient redirected multiple times. Will continue to monitor.

## 2021-09-18 NOTE — ED Notes (Signed)
Pt got up and used the restroom. This tech and Jessica,RN attempted to get vital signs. Pt refused at this moment. No other needs found at this moment.

## 2021-09-19 MED ORDER — LORAZEPAM 2 MG/ML IJ SOLN: 4.0000 mg | Freq: Once | INTRAMUSCULAR | Status: AC

## 2021-09-19 MED ORDER — LORAZEPAM 2 MG/ML IJ SOLN
INTRAMUSCULAR | Status: AC
  Administered 2021-09-19: 4 mg
  Filled 2021-09-19: qty 2

## 2021-09-19 NOTE — ED Notes (Signed)
Officer Brand to unit to assist with situation. Pt requires manual holds and attempts to deescalate. All unsuccessful

## 2021-09-19 NOTE — ED Notes (Signed)
Pt given snack. 
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
## 2021-09-19 NOTE — ED Notes (Signed)
Pt is provided with snack and drink at this time, has deescalated currently, will continue to monitor.

## 2021-09-19 NOTE — ED Notes (Signed)
Report received from RN including SBAR. Patient alert and oriented, warm and dry, in no acute distress. Patient denies SI, HI, AVH and pain. Patient made aware of Q15 minute rounds and security cameras for their safety. Patient instructed to come to this nurse with needs or concerns. Pt refuses vitals to be obtained, will continue to monitor.

## 2021-09-19 NOTE — ED Notes (Signed)
Pt seems to be escalating, becoming louder and more disruptive. Will continue to monitor, unable to determine what patient is needing at this time.

## 2021-09-19 NOTE — ED Notes (Signed)
Dr. Katrinka Blazing over to [facility] to assess pt. Order for Ativan 4MG  IM made at this time via VO. Collected and to be administered by this nurse

## 2021-09-19 NOTE — ED Notes (Signed)
Pt takes injections willingly, security was present

## 2021-09-19 NOTE — ED Provider Notes (Signed)
Emergency Medicine Observation Re-evaluation Note  Sophie Quiles is a 60 y.o. male, seen on rounds today.    Physical Exam  BP (!) 155/96 (BP Location: Right Arm)   Pulse (!) 102   Temp 98 F (36.7 C) (Oral)   Resp 18   Ht 5\' 8"  (1.727 m)   Wt 79.4 kg   SpO2 100%   BMI 26.61 kg/m  Physical Exam General: Patient resting comfortably in bed Lungs: Patient in no respiratory distress Psych: Patient not combative  ED Course / MDM  EKG:  Plan  Current plan is for patient under psychiatric care.  Prakash Kimberling is not under involuntary commitment.     [name], MD 09/19/21 [phone]

## 2021-09-19 NOTE — ED Notes (Signed)
This nurse was collecting pt scheduled meds, pt saw them and got excited requesting them. When scanned and handed to pt he then started yelling no and gave them back to this nurse. Will continue to monitor.

## 2021-09-19 NOTE — ED Notes (Signed)
Pt has continued to escalate since this nurse arrival. Pt is now yelling, unable to calm pt. Pt is bringing his mattress out of room and is being disruptive to other patients on unit. PRN to be administered. Security is present, awaiting for more members to ensure safety of pt and staff.

## 2021-09-19 NOTE — ED Notes (Signed)
Pt out of room again, pt to restroom. Returns to day room walking around and talking with security. No aggression

## 2021-09-19 NOTE — ED Notes (Signed)
Pt has calmed, laying in bed and seems to be communicating like he is a different person. Pt cooperative and laying in bed. Provided with pillow and blanket for comfort.

## 2021-09-19 NOTE — ED Notes (Signed)
Meal given to pt.

## 2021-09-20 NOTE — ED Notes (Signed)
Patient continues to pace unit screaming "home", "Adam [name]" and banging on door.

## 2021-09-20 NOTE — ED Notes (Signed)
Pt refuses to take medication, yelling "no" at [employer]. Pt's mattress cleansed of juice. Pt has pulled mattress and linens out into day room.

## 2021-09-20 NOTE — ED Notes (Signed)
Pt continues to pace around unit, intermittently hitting on door. Pt repeatedly asking for water. This nurse prepares pt water but he refuses, point to desk where nurse sits. This nurse believed pt was attempting to request remote, pt had one in room that was shown and pt states thank you. Will continue to monitor

## 2021-09-20 NOTE — ED Notes (Signed)
Patient dragging his mattress around dayroom at this time. Patient will not respond to staff

## 2021-09-20 NOTE — ED Notes (Signed)
Rayquon asked this RN for his mattress back in his room. Security put mattress back in room and patient immediately started shouting and yelling. Patient screaming to take mattress back out. Security put mattress back in sally port at this time. Patient running around dayroom screaming "TV" after just handing this RN back remote.

## 2021-09-20 NOTE — ED Notes (Signed)
Pt asleep at this time, unable to collect vitals. Will collect pt vitals once awake. 

## 2021-09-20 NOTE — ED Notes (Signed)
Pt refuses to take oral medications and refuses shot of haldol. Will continue to try oral meds. Pt ambulatory around day room and into his room shouting "no", "day with all roomates".

## 2021-09-20 NOTE — ED Notes (Signed)
Adalid is on phone with mother at this time

## 2021-09-20 NOTE — ED Notes (Signed)
Vol /pending placement 

## 2021-09-20 NOTE — ED Provider Notes (Signed)
Emergency Medicine Observation Re-evaluation Note  Adam [name] is a 60 y.o. male, seen on rounds today.  Pt initially presented to the ED for complaints of Psychiatric Evaluation Currently, the patient is calm, resting.  Physical Exam  BP (!) 117/95 (BP Location: Left Arm)   Pulse 93   Temp 98.1 F (36.7 C) (Oral)   Resp 20   Ht 5\' 8"  (1.727 m)   Wt 79.4 kg   SpO2 100%   BMI 26.61 kg/m  Physical Exam General: NAD Cardiac: Perfused Lungs: Normal WOB Psych: Calm  ED Course / MDM  EKG:EKG Interpretation  Date/Time:  Monday August 28 2021 20:12:52 EDT Ventricular Rate:  117 PR Interval:  162 QRS Duration: 104 QT Interval:  306 QTC Calculation: 426 R Axis:   -17 Text Interpretation: Sinus tachycardia Otherwise normal ECG Confirmed by UNCONFIRMED, DOCTOR (08-25-1991), editor [id number], Tammy [phone]) on 08/29/2021 8:10:23 AM  I have reviewed the labs performed to date as well as medications administered while in observation.  Recent changes in the last 24 hours include intermittently acting out, but redirectable  Plan  Current plan is for group home placement.  Adam [name] is not under involuntary commitment.     [name], MD 09/20/21 1452

## 2021-09-20 NOTE — ED Notes (Signed)
Pt took 1600 medications with dinner tray.

## 2021-09-20 NOTE — ED Notes (Signed)
Patient mattress placed in sally port at this time along with mattresses from other rooms so that patient will not bring into dayroom

## 2021-09-20 NOTE — ED Notes (Signed)
Patient given PRN IM medications due to aggressive and disruptive behavior. Security staff standing by for safety of RNs and patient. Patient took medications willingly without any issues.

## 2021-09-20 NOTE — ED Notes (Signed)
Snack provided

## 2021-09-20 NOTE — ED Notes (Signed)
Report to kim, rn.  

## 2021-09-20 NOTE — ED Notes (Signed)
VOL, pending placement 

## 2021-09-21 NOTE — ED Notes (Addendum)
Afternoon medications have been held.  Pt is still sleeping soundly.

## 2021-09-21 NOTE — ED Notes (Signed)
Pt repeatedly singing the name "Adam [name]" at the nurse's station door.

## 2021-09-21 NOTE — ED Notes (Addendum)
Patient awake walking in unit trying to open doors by turning handles and hitting and kicking doors.  Patient is screaming "No". Writer and security tried to redirect patient multiple times. Patient refusing to go to room or stop behaviors. Patient Network engineer in shoulder when Clinical research associate was trying to talk with him and encourage patient to go to restroom.

## 2021-09-21 NOTE — ED Notes (Signed)
VOLUNTARY/continues to await placement 

## 2021-09-21 NOTE — ED Notes (Signed)
Patient refused snack.  

## 2021-09-21 NOTE — TOC Progression Note (Signed)
Transition of Care [facility]) - Progression Note    Patient Details  Name: Adam [name] MRN: [id number] Date of Birth: Oct 26, 1961  Transition of Care [facility]) CM/SW Contact  Joseph Art, [place] Phone Number: 09/21/2021, 7:24 AM  Clinical Narrative:     Patient assessment scheduled w/ [facility], w/ Donna Bernard and Adan Sis to meet with the patient on 09/25/2021 for possible placement.  CSW requested a confirmation for time.  CSW is waiting for confirmation on time of arrival.  Expected Discharge Plan: Group Home Barriers to Discharge: Continued Medical Work up  Expected Discharge Plan and Services Expected Discharge Plan: Group Home In-house Referral: Clinical Social Work     Living arrangements for the past 2 months: Group Home                                       Social Determinants of Health (SDOH) Interventions    Readmission Risk Interventions No flowsheet data found.

## 2021-09-21 NOTE — ED Notes (Signed)
Pt was provided snack and drink

## 2021-09-21 NOTE — ED Notes (Signed)
Pt sleeping on bed without mattress.  Pt moved mattress outside his room by sally port door.

## 2021-09-21 NOTE — ED Notes (Signed)
Pt at nurse's station door, "Ohhh yeah, yellow!"   Unable to determine exactly what pt wants at this time.

## 2021-09-21 NOTE — ED Notes (Signed)
Pt came to nurses station door with the tv remote. Writer fix the remote to his tv and started saying "tvPatent attorney said " tv is in your room." Pt preceded to scream tv. Nurse try deescalate his behavior but was unsuccessful. He is now screaming "tv the remote"

## 2021-09-21 NOTE — ED Notes (Signed)
Patient received IM medications due to behaviors. Security was with Clinical research associate and Consulting civil engineer for safety of staff and patient. Patient took medication with no issues.

## 2021-09-21 NOTE — ED Notes (Signed)
Bruce from social services here to assess patient. Contact numbers: [phone]                               [phone]    Patient was asleep and did not wake to be assessed.

## 2021-09-21 NOTE — ED Notes (Signed)
Medications will be held until pt wakes.

## 2021-09-21 NOTE — ED Notes (Signed)
RN dialed phone so patient could speak with his mother.

## 2021-09-21 NOTE — ED Notes (Signed)
Pt standing in shower with shirt and underwear on.  Refuses to let NT to assist him with his pants.

## 2021-09-22 NOTE — ED Notes (Signed)
Pt provided with clean scrubs to change into. Pt has smeared toothpaste on scrubs while performing nightly oral care.

## 2021-09-22 NOTE — ED Notes (Signed)
Patient received dinner tray. Eating at this time.

## 2021-09-22 NOTE — ED Notes (Signed)
Patient yelling "phone, phone." Patient reminded he had the phone in his room. Patient calmed down.

## 2021-09-22 NOTE — ED Notes (Signed)
Patient on the phone at this time 

## 2021-09-22 NOTE — ED Notes (Signed)
Patient in dayroom banging on nurses station door screaming "nowhere, nowhere, nowhere" Patient redirected by staff. Behaviors continues. Will continue to monitor.

## 2021-09-22 NOTE — ED Notes (Signed)
Room cleaned by housekeeping, floor mopped, trash thrown out.

## 2021-09-22 NOTE — TOC Initial Note (Signed)
Transition of Care [facility]) - Initial/Assessment Note    Patient Details  Name: Adam [name] MRN: [id number] Date of Birth: 1961-05-29  Transition of Care [facility]) CM/SW Contact:    Joseph Art, LCSWA Phone Number: 09/22/2021, 4:27 PM  Clinical Narrative:                  Patient assessment scheduled w/ [facility], w/ Donna Bernard and Adan Sis to meet with the patient on 09/25/2021 for possible placement.   CSW contacted Toby with [employer] requesting time for 09/25/2021 patient.   Expected Discharge Plan: Group Home Barriers to Discharge: Continued Medical Work up   Patient Goals and CMS Choice        Expected Discharge Plan and Services Expected Discharge Plan: Group Home In-house Referral: Clinical Social Work     Living arrangements for the past 2 months: Group Home                                      Prior Living Arrangements/Services Living arrangements for the past 2 months: Group Home Lives with:: Facility Resident          Need for Family Participation in Patient Care: Yes (Comment) Care giver support system in place?: Yes (comment) (Mother [place] [phone])      Activities of Daily Living      Permission Sought/Granted                  Emotional Assessment           Psych Involvement: Yes (comment)  Admission diagnosis:  IVC Patient Active Problem List   Diagnosis Date Noted   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 [place] [address] Phone: [phone] Fax: [phone]     Social Determinants of Health (SDOH) Interventions    Readmission Risk  Interventions No flowsheet data found.

## 2021-09-22 NOTE — ED Notes (Signed)
VOL  PENDING  PLACEMENT 

## 2021-09-22 NOTE — ED Provider Notes (Signed)
Emergency Medicine Observation Re-evaluation Note  Adam [name] is a 60 y.o. male, seen for psychiatric evaluation.  No acute events.  Physical Exam  BP (!) 132/98 (BP Location: Left Arm)   Pulse 83   Temp 97.6 F (36.4 C) (Oral)   Resp 20   Ht 5\' 8"  (1.727 m)   Wt 79.4 kg   SpO2 100%   BMI 26.61 kg/m    ED Course / MDM   No recent lab work for evaluation. Plan  Current plan is for social work placement.  Adam [name] is not under involuntary commitment.     [name], MD 09/22/21 [phone]

## 2021-09-22 NOTE — ED Notes (Signed)
VOL/Pending Placement 

## 2021-09-22 NOTE — ED Notes (Signed)
Patient took mattress and pillows out of room and brought to nurses station. Patient currently standing at nurses station door. Patient is not screaming or showing any other behaviors at this time.

## 2021-09-23 NOTE — ED Notes (Signed)
RN dialed pt's mother's number for pt to speak with her.

## 2021-09-23 NOTE — ED Notes (Signed)
Pt agitated and yelling at staff through nurse's station door.

## 2021-09-23 NOTE — ED Notes (Signed)
Meal tray given 

## 2021-09-23 NOTE — ED Notes (Signed)
All shower supplies collected by RN.

## 2021-09-23 NOTE — ED Notes (Signed)
Charge nurse on unit to visit with patient and decrease agitation.

## 2021-09-23 NOTE — ED Notes (Signed)
Medication and breakfast will be give once pt wakes.

## 2021-09-23 NOTE — ED Notes (Signed)
Pt sleeping.  VS will be taken once pt is awake. 

## 2021-09-23 NOTE — ED Provider Notes (Signed)
Emergency Medicine Observation Re-evaluation Note  Adam [name] is a 60 y.o. male, seen on rounds today.   Physical Exam  BP (!) 141/101 (BP Location: Left Arm)   Pulse 63   Temp 97.8 F (36.6 C) (Oral)   Resp 19   Ht 5\' 8"  (1.727 m)   Wt 79.4 kg   SpO2 97%   BMI 26.61 kg/m  Physical Exam General: Patient resting comfortably on the bed Lungs: Patient in no respiratory distress Psych: Patient not combative  ED Course / MDM    Plan  Current plan is for social work disposition  Adam [name] is not under involuntary commitment.     [name], MD 09/23/21 [phone]

## 2021-09-23 NOTE — ED Notes (Signed)
Lunch tray given. 

## 2021-09-23 NOTE — ED Notes (Signed)
Report to Mani, rn

## 2021-09-23 NOTE — ED Notes (Signed)
Pt upset he cannot get in touch with his mom.

## 2021-09-23 NOTE — ED Notes (Signed)
VOL/pending placement 

## 2021-09-24 NOTE — ED Notes (Signed)
Pt walking around day room, smiling, and intermittently knocking on nursing station door. Pt is redirectable and follows staff out into day room, interacting with staff. Pt denies pain, resps unlabored. Pt reminded tonight that his mattress must stay in his room and recommended to pt to sleep on his mattress instead of hard bed tonight. Pt declines offer for po fluids or snack. TV remote fixed for pt.

## 2021-09-24 NOTE — ED Provider Notes (Signed)
Emergency Medicine Observation Re-evaluation Note  Adam [name] is a 60 y.o. male, seen on rounds today.   Physical Exam  BP 109/87 (BP Location: Left Arm)   Pulse 90   Temp (!) 97.5 F (36.4 C) (Oral)   Resp 18   Ht 5\' 8"  (1.727 m)   Wt 79.4 kg   SpO2 99%   BMI 26.61 kg/m  Physical Exam General: Patient resting comfortably in bed Lungs: Patient in no respiratory distress Psych: Patient not combative  ED Course / MDM  EKG:  Plan  Current plan is for social work placement and disposition  Adam [name] is not under involuntary commitment.     [name], MD 09/24/21 [phone]

## 2021-09-24 NOTE — ED Notes (Signed)
Patient received dinner tray 

## 2021-09-24 NOTE — ED Notes (Signed)
Right hand appears slightly red and puffy. Dr Scotty Court made aware.

## 2021-09-24 NOTE — ED Notes (Signed)
Pt attempted to shower but was never completely naked or wet in shower. Room cleaned and sheets changed. Pt refuses to change pants right now. Given new shirt.
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
## 2021-09-24 NOTE — ED Notes (Signed)
Pt given phone to talk to mother. 

## 2021-09-24 NOTE — ED Notes (Signed)
Pt given snack and drink 

## 2021-09-24 NOTE — ED Notes (Signed)
Pt manually held for med administration in chair by two security officers. PRN medication given by this RN in left deltoid.

## 2021-09-24 NOTE — ED Notes (Signed)
Dr. Stafford at bedside.  

## 2021-09-24 NOTE — ED Notes (Addendum)
Pt extremely aggressive, hitting doors, walls, screaming words that don't make sense. Pt throwing objects. Security officer attempted to move pt back to room. Pt ran back out screaming at staff. Second Engineer, materials at bedside for emergency med administration.

## 2021-09-24 NOTE — ED Notes (Signed)
Patient received breakfast tray 

## 2021-09-24 NOTE — ED Notes (Signed)
Dr Darnelle Catalan made aware of manual hold and medication administration. Pt was held by security until patient agreed to stop yelling. Pt has no signs of injury, able to walk around independently at this time. Calmer but still upset.

## 2021-09-24 NOTE — ED Notes (Signed)
Pt given lunch tray.

## 2021-09-24 NOTE — ED Notes (Signed)
Pt calmer now. Given water. Denies other complaints. Manual hold order d/c at this time.

## 2021-09-25 NOTE — Progress Notes (Signed)
   09/25/21 1342  Clinical Encounter Type  Visited With Patient  Visit Type Follow-up;Spiritual support;Social support  Spiritual Encounters  Spiritual Needs Prayer;Other (Comment) (social support)  Chaplain Burris engaged Pt and offered social support. Pt seemed very glad for the visit and recognized chaplain. Chaplain Burris [employer] of presence, encouragement, and prayer. Will continue to follow.

## 2021-09-25 NOTE — ED Notes (Signed)
Patient is upset and is hitting the nurses station door. We have tried to calm him down,but he is very frustrated at this time.

## 2021-09-25 NOTE — TOC Progression Note (Signed)
Transition of Care [facility]) - Progression Note    Patient Details  Name: Adam [name] MRN: [id number] Date of Birth: 1961/04/28  Transition of Care [facility]) CM/SW Contact  Joseph Art, [place] Phone Number: 09/25/2021, 12:49 PM  Clinical Narrative:     CSW spoke with Donna Bernard [facility] [phone], she will arrive tomorrow 09/26/2021 at 3:00 PM to assess patient for placement.  EDP and ED Staff updated.  Expected Discharge Plan: Group Home Barriers to Discharge: Continued Medical Work up  Expected Discharge Plan and Services Expected Discharge Plan: Group Home In-house Referral: Clinical Social Work     Living arrangements for the past 2 months: Group Home                                       Social Determinants of Health (SDOH) Interventions    Readmission Risk Interventions No flowsheet data found.

## 2021-09-25 NOTE — ED Notes (Signed)
Gave lunch tray with water. 

## 2021-09-25 NOTE — ED Notes (Signed)
PRN IM medications given with security standing by for safety reasons. Patient took medications with no issues. Will continue to monitor behaviors.

## 2021-09-25 NOTE — ED Notes (Signed)
Pt and medications scanned, but pt unwilling to take them at this time.  RN will hold medications and try again.

## 2021-09-25 NOTE — ED Notes (Signed)
Vol to be d/c 10/25 to [facility]

## 2021-09-25 NOTE — ED Notes (Signed)
Pt speaking with Chaplain. 

## 2021-09-25 NOTE — ED Notes (Signed)
Pt has been screaming and hitting the office door very hard. Pt would then walk to sally port door near his room 5 and beat on that door.Staff has tried to console and as pt what his needs are, but would not get any logical response from him. Pt received snack tray and drink for night time snack.

## 2021-09-25 NOTE — TOC Progression Note (Signed)
Transition of Care [facility]) - Progression Note    Patient Details  Name: Adam [name] MRN: [id number] Date of Birth: 12/15/1960  Transition of Care [facility]) CM/SW Contact  Joseph Art, [place] Phone Number: 09/25/2021, 8:52 AM  Clinical Narrative:     CSW spoke with Adam [name] [facility] [phone], she stated she will come by to assess the patient for placement on either Wednesday 09/27/2021 or Thursday 09/28/2021.  Ms. Adam [name] stated she would call this CSW with a time.  Expected Discharge Plan: Group Home Barriers to Discharge: Continued Medical Work up  Expected Discharge Plan and Services Expected Discharge Plan: Group Home In-house Referral: Clinical Social Work     Living arrangements for the past 2 months: Group Home                                       Social Determinants of Health (SDOH) Interventions    Readmission Risk Interventions No flowsheet data found.

## 2021-09-25 NOTE — ED Notes (Signed)
Unable to obtain vitals due to Pt sleeping. Will continue to monitor and inform incoming tech.

## 2021-09-25 NOTE — ED Notes (Signed)
Patient banging on door and screaming at nursing staff, "phone call." Patient redirected by Clinical research associate.

## 2021-09-25 NOTE — ED Notes (Signed)
Pt to nurse's station asking for his medications.  Medications given.

## 2021-09-25 NOTE — ED Notes (Signed)
Report to amy, rn

## 2021-09-25 NOTE — ED Notes (Signed)
Pt agitated. Unable to obtain VS.

## 2021-09-25 NOTE — ED Notes (Signed)
Gave food tray with water. °

## 2021-09-25 NOTE — ED Notes (Signed)
Vs will be taken once pt is awake.

## 2021-09-25 NOTE — ED Provider Notes (Signed)
Emergency Medicine Observation Re-evaluation Note  Adam [name] is a 60 y.o. male, seen on rounds today.  Pt initially presented to the ED for complaints of Psychiatric Evaluation   Physical Exam  BP 133/87 (BP Location: Left Arm)   Pulse 90   Temp 99.2 F (37.3 C) (Oral)   Resp 20   Ht 5\' 8"  (1.727 m)   Wt 79.4 kg   SpO2 100%   BMI 26.61 kg/m  Physical Exam General: no acute distress Lungs: equal chest rise Psych: calm  ED Course / MDM  EKG:EKG Interpretation  Date/Time:  Monday August 28 2021 20:12:52 EDT Ventricular Rate:  117 PR Interval:  162 QRS Duration: 104 QT Interval:  306 QTC Calculation: 426 R Axis:   -17 Text Interpretation: Sinus tachycardia Otherwise normal ECG Confirmed by UNCONFIRMED, DOCTOR (08-25-1991), editor [id number], Tammy [phone]) on 08/29/2021 8:10:23 AM  I have reviewed the labs performed to date as well as medications administered while in observation.  Recent changes in the last 24 hours include Geodon for agitation (resolved w/ Geodon).  Plan  Current plan is for social work dispo.  Ryott Rafferty is not under involuntary commitment.     [name], MD 09/25/21 [phone]

## 2021-09-25 NOTE — ED Notes (Signed)
Pt yelling, banging aggressively on doors and windows. Pt refused oral medications.  EDP, NP and charge nurse made aware.  PRN IM given.  Security held pt briefly for medication administration.  Immediately released from hold. Pt continues to walk around unit.  No distress noted. EDP and charge made aware of manual hold.

## 2021-09-26 NOTE — ED Notes (Signed)
Pt refusing all medications. Continues to yell very loudly and bang on doors and windows.  Unable to redirect pt with music, phone call with mom or TV.  NP made aware.  Pt given IM Haldol with security present.

## 2021-09-26 NOTE — ED Notes (Signed)
Patient coming to nurses station screaming and banging on door and window. Patient redirected multiple times by staff. Behaviors continue even after redirection.

## 2021-09-26 NOTE — ED Notes (Signed)
Patient on phone at this time 

## 2021-09-26 NOTE — ED Notes (Signed)
Food tray with water was given. 

## 2021-09-26 NOTE — ED Notes (Signed)
Pt has calmed down and is asking for his medications.

## 2021-09-26 NOTE — ED Notes (Signed)
Patient went into another patient's room. This Clinical research associate along with ED tech and security redirected patient and advised patient he is not allowed to go into other patient's room.

## 2021-09-26 NOTE — ED Provider Notes (Signed)
Emergency Medicine Observation Re-evaluation Note  Adam [name] is a 60 y.o. male, seen on rounds today.  Pt initially presented to the ED for complaints of Psychiatric Evaluation Currently, the patient is calm, resting.  Physical Exam  BP (!) 151/97 (BP Location: Left Arm)   Pulse 78   Temp 97.9 F (36.6 C) (Oral)   Resp 16   Ht 5\' 8"  (1.727 m)   Wt 79.4 kg   SpO2 99%   BMI 26.61 kg/m   ED Course / MDM  EKG:EKG Interpretation  Date/Time:  Monday August 28 2021 20:12:52 EDT Ventricular Rate:  117 PR Interval:  162 QRS Duration: 104 QT Interval:  306 QTC Calculation: 426 R Axis:   -17 Text Interpretation: Sinus tachycardia Otherwise normal ECG Confirmed by UNCONFIRMED, DOCTOR (08-25-1991), editor [id number], Tammy [phone]) on 08/29/2021 8:10:23 AM  I have reviewed the labs performed to date as well as medications administered while in observation.  Recent changes in the last 24 hours include none.  Plan  Current plan is for SW disposition.  Adam [name] is not under involuntary commitment.     [name], MD 09/26/21 [phone]

## 2021-09-26 NOTE — ED Notes (Signed)
The facility scheduled to assess the patient did not show.  RN unable to contact Child psychotherapist.

## 2021-09-26 NOTE — TOC Progression Note (Signed)
Transition of Care [facility]) - Progression Note    Patient Details  Name: Adam [name] MRN: [id number] Date of Birth: 01-Oct-1961  Transition of Care [facility]) CM/SW Contact  Joseph Art, [place] Phone Number: 09/26/2021, 7:53 AM  Clinical Narrative:     Donna Bernard [facility] [phone], she will arrive today 09/26/2021 at 3:00 PM to assess patient for placement.  EDP and ED Staff updated.  Expected Discharge Plan: Group Home Barriers to Discharge: Continued Medical Work up  Expected Discharge Plan and Services Expected Discharge Plan: Group Home In-house Referral: Clinical Social Work     Living arrangements for the past 2 months: Group Home                                       Social Determinants of Health (SDOH) Interventions    Readmission Risk Interventions No flowsheet data found.

## 2021-09-26 NOTE — ED Notes (Signed)
Pt given snack. 

## 2021-09-26 NOTE — ED Notes (Signed)
Unable to obtain vitals due to patient sleeping. Will continue to monitor.   

## 2021-09-26 NOTE — ED Notes (Signed)
Pt continues to pace around the unit yelling "Lucifier."  Pt is still unable to be re-directed.  RN will speak with NP on medication options.

## 2021-09-26 NOTE — ED Notes (Signed)
Pt has been at the nurse's station door, but is smiling and calm.

## 2021-09-26 NOTE — ED Notes (Signed)
Patient given IM medication per PRN orders. EDP made aware. Patient took medications with no issues with security standing by for writer and patient's safety. Will continue to monitor.

## 2021-09-26 NOTE — ED Notes (Signed)
Patient screaming at nurses station door and hitting on door and window. Patient redirected multiple times.

## 2021-09-26 NOTE — ED Notes (Signed)
VOL/Pending D/C to group home

## 2021-09-27 MED ORDER — HALOPERIDOL LACTATE 5 MG/ML IJ SOLN
5.0000 mg | Freq: Once | INTRAMUSCULAR | Status: AC
  Administered 2021-09-27: 5 mg via INTRAMUSCULAR
  Filled 2021-09-27: qty 1

## 2021-09-27 MED ORDER — DIPHENHYDRAMINE HCL 50 MG/ML IJ SOLN
50.0000 mg | Freq: Once | INTRAMUSCULAR | Status: AC
  Administered 2021-09-27: 50 mg via INTRAMUSCULAR
  Filled 2021-09-27: qty 1

## 2021-09-27 MED ORDER — LORAZEPAM 2 MG/ML IJ SOLN
2.0000 mg | Freq: Once | INTRAMUSCULAR | Status: AC
  Administered 2021-09-27: 2 mg via INTRAMUSCULAR
  Filled 2021-09-27: qty 1

## 2021-09-27 NOTE — ED Notes (Signed)
Unable to obtain vitals due to patient sleeping. Will continue to monitor.   

## 2021-09-27 NOTE — ED Notes (Signed)
Pt. came to the door banging and screaming "tv remote".

## 2021-09-27 NOTE — TOC Progression Note (Signed)
Transition of Care [facility]) - Progression Note    Patient Details  Name: Adam [name] MRN: [id number] Date of Birth: 1961-07-25  Transition of Care [facility]) CM/SW Contact  Joseph Art, [place] Phone Number: 09/27/2021, 8:14 AM  Clinical Narrative:     CSW received  an update Donna Bernard from [facility], was not able to come and meet with the patient.  They are scheduled to come on Thursday 09/28/2021 at 11:00AM.  EDP/ED Staff updated.   Expected Discharge Plan: Group Home Barriers to Discharge: Continued Medical Work up  Expected Discharge Plan and Services Expected Discharge Plan: Group Home In-house Referral: Clinical Social Work     Living arrangements for the past 2 months: Group Home                                       Social Determinants of Health (SDOH) Interventions    Readmission Risk Interventions No flowsheet data found.

## 2021-09-27 NOTE — ED Notes (Addendum)
Pt was uncooperative for vital signs.

## 2021-09-28 NOTE — ED Notes (Signed)
Report to include Situation, Background, Assessment, and Recommendations received from Allyson RN. Patient alert and oriented, warm and dry, in no acute distress. Patient denies SI, HI, AVH and pain. Patient made aware of Q15 minute rounds and security cameras for their safety. Patient instructed to come to me with needs or concerns. ? ?

## 2021-09-28 NOTE — ED Notes (Signed)
Pacing around dayroom, appears distressed. Offered patient phone, water, remote--pt declines. Pt offered medication, states yes when RN offered shot. Pt sits down in dayroom and takes shirt off. Willingly takes IM shot without incident.

## 2021-09-28 NOTE — ED Notes (Signed)
Banging on door, yelling "stratocaster fireball" repetitively. Yelling "water". Offered water, offered phone, offered remote-pt declines.

## 2021-09-28 NOTE — ED Notes (Signed)
Standing at door singing peacefully.

## 2021-09-28 NOTE — ED Notes (Addendum)
Case Manager and Donna Bernard from [facility] speaking with patient in room. Security at side.

## 2021-09-28 NOTE — ED Notes (Addendum)
Pt noted to be walking around the dayroom then heading back to his room. RN attempted to interact with patient, pt does not respond to RN at all. Pt will not answer questions or follow commands requested by RN. Back in bed at this time.

## 2021-09-28 NOTE — ED Notes (Signed)
Given breakfast. On phone with mother.

## 2021-09-28 NOTE — TOC Progression Note (Signed)
Transition of Care [facility]) - Progression Note    Patient Details  Name: Adam [name] MRN: [id number] Date of Birth: 1961/08/20  Transition of Care [facility]) CM/SW Lyn[place], RN Phone Number: 09/28/2021, 12:49 PM  Clinical Narrative:  Odella Aquas, Tera Partridge, her assistant and myself met with patient. Ms. Shirlee Limerick explained the Group Home expectations to include rules, no aggressive behavior to include hitting other residents nor staff and chores. Patient agrees and receptive to going to group home. Shirlee Limerick to follow up tomorrow with TOC to see if patient still agrees to staying at group home. TOC will assess tomorrow and contact Shirlee Limerick with outcome.     Expected Discharge Plan: Group Home Barriers to Discharge: Continued Medical Work up  Expected Discharge Plan and Services Expected Discharge Plan: Group Home In-house Referral: Clinical Social Work     Living arrangements for the past 2 months: Group Home                                       Social Determinants of Health (SDOH) Interventions    Readmission Risk Interventions No flowsheet data found.

## 2021-09-28 NOTE — ED Notes (Signed)
Hospital meal provided.  

## 2021-09-28 NOTE — ED Notes (Signed)
VOL/Pending Placement 

## 2021-09-28 NOTE — ED Notes (Signed)
VOL  PENDING  PLACEMENT 

## 2021-09-28 NOTE — ED Notes (Addendum)
Mother called for patient, pt continues to yell and repetitively yell "earth mechanics fireball" and "blackmore is night". Pt unable to calm himself down at this time. Dr. Toni Amend aware.  RN received recommendations on appropriate PRN medications that are ordered.

## 2021-09-29 NOTE — ED Notes (Signed)
VOl pending placment possible placement 11/1  see note on chart

## 2021-09-29 NOTE — ED Notes (Signed)
Hourly rounding reveals patient in room. No complaints, stable, in no acute distress. Q15 minute rounds and monitoring via Security Cameras to continue. 

## 2021-09-29 NOTE — ED Notes (Signed)
VOL/Pending Placement 

## 2021-09-29 NOTE — TOC Progression Note (Signed)
Transition of Care [facility]) - Progression Note    Patient Details  Name: Adam [name] MRN: [id number] Date of Birth: 25-Dec-1960  Transition of Care [facility]) CM/SW Contact  [name] Phone Number: [phone] 09/29/2021, 1:58 PM  Clinical Narrative:     CSW left voicemail for Donna Bernard [facility] [phone], requesting an update on patient placement.  CSW also left voicemail for [facility] Intake Coordinator [phone] requesting update on placement for patient. CSW spoke with [name] w/ [facility] [phone], who stated he would send an email to [facility] requesting an update for placement.  Expected Discharge Plan: Group Home Barriers to Discharge: Continued Medical Work up  Expected Discharge Plan and Services Expected Discharge Plan: Group Home In-house Referral: Clinical Social Work     Living arrangements for the past 2 months: Group Home                                       Social Determinants of Health (SDOH) Interventions    Readmission Risk Interventions No flowsheet data found. --

## 2021-09-29 NOTE — TOC Progression Note (Signed)
Transition of Care [facility]) - Progression Note    Patient Details  Name: Adam [name] MRN: [id number] Date of Birth: Sep 25, 1961  Transition of Care [facility]) CM/SW Contact  Joseph Art, [place] Phone Number: 09/29/2021, 4:45 PM  Clinical Narrative:     CSW spoke with Donna Bernard [facility] [phone], requesting update on placement decision. Ms. Genia Harold stated she will need to discuss placement with there behavioral team and she will have a reply about placement for this CSW on Tuesday Nov. 1st, 2022.  Expected Discharge Plan: Group Home Barriers to Discharge: Continued Medical Work up  Expected Discharge Plan and Services Expected Discharge Plan: Group Home In-house Referral: Clinical Social Work     Living arrangements for the past 2 months: Group Home                                       Social Determinants of Health (SDOH) Interventions    Readmission Risk Interventions No flowsheet data found.

## 2021-09-29 NOTE — ED Provider Notes (Signed)
Emergency Medicine Observation Re-evaluation Note  Adam [name] is a 60 y.o. male, seen on rounds today.  Pt initially presented to the ED for complaints of Psychiatric Evaluation Currently, the patient is calm.  Physical Exam  BP (!) 140/102 (BP Location: Left Arm)   Pulse 99   Temp 97.8 F (36.6 C) (Oral)   Resp 16   Ht 5\' 8"  (1.727 m)   Wt 79.4 kg   SpO2 100%   BMI 26.61 kg/m   ED Course / MDM  EKG:EKG Interpretation  Date/Time:  Monday August 28 2021 20:12:52 EDT Ventricular Rate:  117 PR Interval:  162 QRS Duration: 104 QT Interval:  306 QTC Calculation: 426 R Axis:   -17 Text Interpretation: Sinus tachycardia Otherwise normal ECG Confirmed by UNCONFIRMED, DOCTOR (08-25-1991), editor [id number], Tammy [phone]) on 08/29/2021 8:10:23 AM  I have reviewed the labs performed to date as well as medications administered while in observation.  Recent changes in the last 24 hours include none.  Plan  Current plan is for social work placement.  Adam [name] is not under involuntary commitment.     [name], MD 09/29/21 [phone]

## 2021-09-29 NOTE — TOC Progression Note (Signed)
Transition of Care [facility]) - Progression Note    Patient Details  Name: Adam [name] MRN: [id number] Date of Birth: 09/24/1961  Transition of Care [facility]) CM/SW Contact  Joseph Art, [place] Phone Number: 09/29/2021, 10:27 AM  Clinical Narrative:    CSW spoke with Delorise Shiner from [facility] who requested this CSW call her later to talk about patient meeting.   Expected Discharge Plan: Group Home Barriers to Discharge: Continued Medical Work up  Expected Discharge Plan and Services Expected Discharge Plan: Group Home In-house Referral: Clinical Social Work     Living arrangements for the past 2 months: Group Home                                       Social Determinants of Health (SDOH) Interventions    Readmission Risk Interventions No flowsheet data found.

## 2021-09-29 NOTE — ED Notes (Signed)
Vital signs deferred at this time due to patient sleeping.  

## 2021-09-29 NOTE — ED Notes (Signed)
Report to include Situation, Background, Assessment, and Recommendations received from Katie RN. Patient alert and oriented, warm and dry, in no acute distress. Patient denies SI, HI, AVH and pain. Patient made aware of Q15 minute rounds and security cameras for their safety. Patient instructed to come to me with needs or concerns.  

## 2021-09-29 NOTE — ED Notes (Signed)
Snack and beverage given. 

## 2021-09-30 NOTE — ED Notes (Signed)
Hourly rounding reveals patient in room. No complaints, stable, in no acute distress. Q15 minute rounds and monitoring via Security Cameras to continue. 

## 2021-09-30 NOTE — ED Notes (Signed)
Meal tray given 

## 2021-09-30 NOTE — ED Notes (Signed)
VOL pending placement see note on chart ?

## 2021-09-30 NOTE — ED Notes (Signed)
RN dialed pt's mother's phone number for pt.

## 2021-09-30 NOTE — ED Provider Notes (Signed)
Emergency Medicine Observation Re-evaluation Note  Gyan Cambre is a 60 y.o. male, seen on rounds today.  Pt initially presented to the ED for complaints of Psychiatric Evaluation Currently, the patient is resting.  Physical Exam  BP (!) 158/112 (BP Location: Right Arm)   Pulse 91   Temp 98.2 F (36.8 C) (Oral)   Resp 18   Ht 5\' 8"  (1.727 m)   Wt 79.4 kg   SpO2 100%   BMI 26.61 kg/m  Physical Exam General: No acute distress Cardiac: Well-perfused extremities Lungs: No respiratory distress Psych: Appropriate mood and affect  ED Course / MDM  EKG:EKG Interpretation  Date/Time:  Monday August 28 2021 20:12:52 EDT Ventricular Rate:  117 PR Interval:  162 QRS Duration: 104 QT Interval:  306 QTC Calculation: 426 R Axis:   -17 Text Interpretation: Sinus tachycardia Otherwise normal ECG Confirmed by UNCONFIRMED, DOCTOR (08-25-1991), editor [id number], Tammy [phone]) on 08/29/2021 8:10:23 AM  I have reviewed the labs performed to date as well as medications administered while in observation.  Recent changes in the last 24 hours include none.  Plan  Current plan is for placement.  Hulen Mandler is not under involuntary commitment.     [name], MD 09/30/21 [phone]

## 2021-09-30 NOTE — ED Notes (Signed)
Report to include Situation, Background, Assessment, and Recommendations received from Amy RN. Patient alert and oriented, warm and dry, in no acute distress. Patient denies SI, HI, AVH and pain. Patient made aware of Q15 minute rounds and security cameras for their safety. Patient instructed to come to me with needs or concerns.  

## 2021-09-30 NOTE — ED Notes (Signed)
EVS called to mop pt's floor.

## 2021-09-30 NOTE — ED Notes (Signed)
Meal tray will be held in nurse's station until pt is awake.

## 2021-09-30 NOTE — ED Notes (Signed)
RN dialed pt's mother's number for him to speak with her.

## 2021-09-30 NOTE — ED Notes (Signed)
VS not taken, patient asleep 

## 2021-09-30 NOTE — ED Notes (Signed)
Pt's  room mopped by EVS.  Dirty linen and trash removed from room. Clean linen provided.

## 2021-10-01 NOTE — ED Notes (Signed)
Hourly rounding reveals patient in room. No complaints, stable, in no acute distress. Q15 minute rounds and monitoring via Security Cameras to continue. 

## 2021-10-01 NOTE — ED Notes (Signed)
VS not taken, patient asleep 

## 2021-10-01 NOTE — ED Notes (Signed)
Pt yelling and banging on nurse's station door.  Staff is unable to verbally re-direct patient.  PRN IM valium given with security present.  Pt took shot willingly.

## 2021-10-01 NOTE — ED Notes (Signed)
VS will ne taken once pt is awake.

## 2021-10-01 NOTE — ED Notes (Signed)
Report to include Situation, Background, Assessment, and Recommendations received from Amy RN. Patient alert and oriented, warm and dry, in no acute distress. Patient denies SI, HI, AVH and pain. Patient made aware of Q15 minute rounds and security cameras for their safety. Patient instructed to come to me with needs or concerns.  

## 2021-10-01 NOTE — ED Notes (Signed)
Pt at door knocking and yelling.

## 2021-10-01 NOTE — ED Notes (Signed)
Patient became very agitated, banging on the window of the nursing station and screaming and yelling "God". Disrupting the unit, unable to redirect. This Clinical research associate asked patient if he wants to take a shot to help him calm down. Patient said " too much" and went to his room and laid down in his bed.

## 2021-10-01 NOTE — ED Notes (Signed)
Pt yelling "yellow mustard" at nurse's station door.

## 2021-10-01 NOTE — ED Provider Notes (Signed)
Emergency Medicine Observation Re-evaluation Note  Adam [name] is a 60 y.o. male, seen on rounds today.   Physical Exam  BP 104/79 (BP Location: Left Arm)   Pulse 92   Temp 97.8 F (36.6 C) (Oral)   Resp 20   Ht 5\' 8"  (1.727 m)   Wt 79.4 kg   SpO2 98%   BMI 26.61 kg/m  Physical Exam General: Patient resting comfortably in bed Lungs: Patient in no respiratory distress Psych: Patient not combative  ED Course / MDM  EKG: Plan  Current plan is for social work placement  Adam [name] is not under involuntary commitment.     [name], MD 10/01/21 [phone]

## 2021-10-01 NOTE — ED Notes (Signed)
Snack and beverage given. 

## 2021-10-01 NOTE — ED Notes (Signed)
VOL/Pending placement 

## 2021-10-02 NOTE — ED Notes (Signed)
Pt comes to door asking for meds but refuses vitals. Back to room calmly.

## 2021-10-02 NOTE — ED Notes (Signed)
Hourly rounding reveals patient in room. No complaints, stable, in no acute distress. Q15 minute rounds and monitoring via Security Cameras to continue. 

## 2021-10-02 NOTE — ED Notes (Signed)
Patient is screaming "freedom, freedom, freedom" and banging on nurse's station windows and door. Patient has been redirected by this Clinical research associate and security.

## 2021-10-02 NOTE — ED Provider Notes (Signed)
Emergency Medicine Observation Re-evaluation Note  Adam [name] is a 60 y.o. male, seen on rounds today.   Physical Exam  BP (!) 142/96 (BP Location: Left Arm)   Pulse 94   Temp 97.7 F (36.5 C) (Oral)   Resp 18   Ht 5\' 8"  (1.727 m)   Wt 79.4 kg   SpO2 96%   BMI 26.61 kg/m  Physical Exam General: Patient resting comfortably in bed Lungs: Patient in no respiratory distress Psych: Patient not currently combative  ED Course / MDM  EKG:  Plan  Current plan is for patient awaiting social work placement  Adam [name] is not under involuntary commitment.     Adam Sharps, MD 10/02/21 [phone]

## 2021-10-02 NOTE — ED Notes (Signed)
Sandwich and soft drink cereal in cup given.

## 2021-10-02 NOTE — ED Notes (Signed)
Patient refused vitals.

## 2021-10-02 NOTE — ED Notes (Signed)
Patient refused breakfast and v-signs

## 2021-10-02 NOTE — ED Notes (Signed)
Pt given phone and mom's number dialed

## 2021-10-02 NOTE — ED Notes (Signed)
Per previous RN, pt refused all meds. Will attempt to get meds and vitals once pt is awake again.

## 2021-10-02 NOTE — TOC Progression Note (Signed)
Transition of Care [facility]) - Progression Note    Patient Details  Name: Adam [name] MRN: [id number] Date of Birth: 09-Sep-1961  [facility]) CM/SW Contact  Joseph Art, [place] Phone Number: 10/02/2021, 10:07 AM  Clinical Narrative:     CSW spoke with [name] w/ [facility] [phone] and updated him on placement delay for patient.  [name] stated he will contact [facility] Intake Coordinator [phone] requesting placement update.  Expected Discharge Plan: Group Home Barriers to Discharge: Continued Medical Work up  Expected Discharge Plan and Services Expected Discharge Plan: Group Home In-house Referral: Clinical Social Work     Living arrangements for the past 2 months: Group Home                                       Social Determinants of Health (SDOH) Interventions    Readmission Risk Interventions No flowsheet data found.

## 2021-10-02 NOTE — ED Notes (Signed)
Pt up to bathroom. Still refusing vitals or meds.

## 2021-10-02 NOTE — ED Notes (Signed)
Lunch tray sat at bedside. Pt rolled over and moaned at RN then went back to sleep.

## 2021-10-02 NOTE — ED Notes (Signed)
Pt given icee as snack

## 2021-10-03 NOTE — ED Notes (Signed)
Patient requesting phone call with mother at this time, this RN contacted Adam [name] at number provided [phone]). Per phone call, she is unable to talk with Adam [name] right now due to other commitments. Adam [name] notified, upset but agrees to call other number that he has on a piece of paper.   Pt also yelling "oh my god, oh god", pacing in day room.

## 2021-10-03 NOTE — ED Notes (Signed)
Report received from Katie, RN including SBAR. Patient alert and oriented, warm and dry, in no acute distress. Patient denies SI, HI, AVH and pain. Patient made aware of Q15 minute rounds and security cameras for their safety. Patient instructed to come to this nurse with needs or concerns.  

## 2021-10-03 NOTE — Progress Notes (Signed)
   09/29/21 1240  Clinical Encounter Type  Visited With Patient  Visit Type Follow-up;Spiritual support;Social support  Referral From Other (Comment) (rounding)  Spiritual Encounters  Spiritual Needs Other (Comment) (social support)  Luna Fuse made a f/u visit to Adam [name]. Pt was excited and received visit very positively as he recognizes this chaplain. Pt was excited about a phone number and seemed to hope or suggest that it may be linked to a potential placement. Luna Fuse shared in this hope and enthusiasm. Chaplain provided positive, supportive presence and offered compassionate presence to this Pt; continuing to build on this relationship and aiming to continue to foster hope and acceptance for and with him.

## 2021-10-03 NOTE — ED Notes (Signed)
Pt constantly yelling "people address" and hitting on door. Pt redirected by this nurse and security. Will continue to monitor. Pt is currently refusing his night time medications.

## 2021-10-03 NOTE — ED Provider Notes (Signed)
Emergency Medicine Observation Re-evaluation Note  Adam [name] is a 60 y.o. male, seen on rounds today.  Pt initially presented to the ED for complaints of Psychiatric Evaluation No acute events overnight.  Physical Exam  BP (!) 140/98 (BP Location: Left Arm)   Pulse 73   Temp 98.6 F (37 C)   Resp 18   Ht 5\' 8"  (1.727 m)   Wt 79.4 kg   SpO2 100%   BMI 26.61 kg/m    ED Course / MDM  EKG:EKG Interpretation  Date/Time:  Monday August 28 2021 20:12:52 EDT Ventricular Rate:  117 PR Interval:  162 QRS Duration: 104 QT Interval:  306 QTC Calculation: 426 R Axis:   -17 Text Interpretation: Sinus tachycardia Otherwise normal ECG Confirmed by UNCONFIRMED, DOCTOR (08-25-1991), editor [id number], Tammy [phone]) on 08/29/2021 8:10:23 AM   Plan  Current plan is for placement into an appropriate facility.  Adam [name] is not under involuntary commitment.     [name], MD 10/03/21 1523

## 2021-10-03 NOTE — ED Notes (Signed)
Gave food tray with water. °

## 2021-10-03 NOTE — ED Notes (Signed)
Each time this nurse is around or speaks to pt now he yells "no" and gets very agitated/angry with this nurse

## 2021-10-03 NOTE — ED Notes (Signed)
Vital signs deferred at this time due to patient sleeping.  

## 2021-10-03 NOTE — ED Notes (Signed)
Gave lunch tray with water. 

## 2021-10-03 NOTE — ED Notes (Signed)
Gave phone to patient to call mother.

## 2021-10-03 NOTE — ED Notes (Signed)
VOL  PENDING  PLACEMENT 

## 2021-10-03 NOTE — ED Notes (Signed)
Attempted to collect pt vitals. Pt has been cooperative and quiet through shift. When Dinamap brought to pt room he immedately yells "NO NO NO!" Will continue to evaluate and collect at later date.

## 2021-10-03 NOTE — ED Notes (Signed)
Pt takes medication willingly except for one marked not given. Pt drinks entire cup of juice with medications. Uninterested in communicating with this nurse, continues to yell no to this nurse

## 2021-10-03 NOTE — ED Notes (Signed)
Patient given italian ice. 

## 2021-10-03 NOTE — ED Notes (Signed)
Patient refusing vital signs at this time.

## 2021-10-04 NOTE — ED Notes (Signed)
Pt given dinner tray.

## 2021-10-04 NOTE — ED Notes (Signed)
Pt offered night time snack at this time. Pt states no when asked. Will offer later if pt would like something to eat.

## 2021-10-04 NOTE — ED Notes (Signed)
VOL, pending placement 

## 2021-10-04 NOTE — ED Notes (Signed)
Pt was chewing on paper. NT [place] alongside with security retrieved paper from pt. NT cleaned up a spill in pt room.

## 2021-10-04 NOTE — ED Notes (Signed)
Pt given shower supplies. Noted to have wet hair and changed clothes.

## 2021-10-04 NOTE — ED Notes (Signed)
Pt is becoming increasingly agitated thus far. More aggressive with yelling is noted. Will continue to monitor as pt cannot communicate with staff and staff attempts are not working for pt.

## 2021-10-04 NOTE — ED Provider Notes (Signed)
Emergency Medicine Observation Re-evaluation Note  Adam [name] is a 60 y.o. male, seen on rounds today.  Pt initially presented to the ED for complaints of Psychiatric Evaluation Currently, the patient is resting.  Physical Exam  BP (!) 147/100 (BP Location: Left Arm)   Pulse 76   Temp 98.1 F (36.7 C) (Oral)   Resp 15   Ht 5\' 8"  (1.727 m)   Wt 79.4 kg   SpO2 100%   BMI 26.61 kg/m  Physical Exam General: No acute distress Cardiac: Well-perfused extremities Lungs: No respiratory distress Psych: Appropriate mood and affect  ED Course / MDM  EKG:EKG Interpretation  Date/Time:  Monday August 28 2021 20:12:52 EDT Ventricular Rate:  117 PR Interval:  162 QRS Duration: 104 QT Interval:  306 QTC Calculation: 426 R Axis:   -17 Text Interpretation: Sinus tachycardia Otherwise normal ECG Confirmed by UNCONFIRMED, DOCTOR (08-25-1991), editor [id number], Tammy [phone]) on 08/29/2021 8:10:23 AM  I have reviewed the labs performed to date as well as medications administered while in observation.  Recent changes in the last 24 hours include none.  Plan  Current plan is for placement.  Jerrie Gullo is not under involuntary commitment.     [name], MD 10/04/21 [phone]

## 2021-10-04 NOTE — ED Notes (Signed)
Report received from [place], English as a second language teacher. Patient alert and oriented, warm and dry, in no acute distress.  Patient made aware of Q15 minute rounds and security cameras for their safety. Patient instructed to come to this nurse with needs or concerns.   On arrival pt was eating paper in room, day shift removed this from belongings for safety purposes

## 2021-10-04 NOTE — ED Notes (Signed)
Attempted to administer pt his scheduled night medication as ordered. Pt immediately yells no when this nurse knocks on door. Pt continues to yell no after each word this nurse states. Pt will not take medication, starts attempting to kick this nurse when nurse was in room. Will hold and administer should pt get up and cooperate. Currently pt laying in bed with light off.

## 2021-10-04 NOTE — TOC Progression Note (Signed)
Transition of Care [facility]) - Progression Note    Patient Details  Name: Tamir Wallman MRN: [id number] Date of Birth: Nov 06, 1961  [facility]) CM/SW Contact  Joseph Art, [place] Phone Number: 10/04/2021, 4:04 PM  Clinical Narrative:     CSW received update from [name] w/ [facility] [phone] on placement at [facility]  [name] stated [facility] will not admit the patient at this time, due to patient's care needs, specifically a one-on-one care giver.  Ms. Genia Harold from [facility] stated she was concerned her current staff would not be able to manage the patient.  Ms. Genia Harold stated she may consider placement in the future if she had the staffing for one-on-one care.  Expected Discharge Plan: Group Home Barriers to Discharge: Continued Medical Work up  Expected Discharge Plan and Services Expected Discharge Plan: Group Home In-house Referral: Clinical Social Work     Living arrangements for the past 2 months: Group Home                                       Social Determinants of Health (SDOH) Interventions    Readmission Risk Interventions No flowsheet data found.

## 2021-10-04 NOTE — ED Notes (Signed)
Pt given crackers and ginger ale for afternoon snack

## 2021-10-04 NOTE — ED Notes (Signed)
Pt given lunch and a soda.

## 2021-10-05 NOTE — ED Notes (Signed)
Patient has took clothes off in room except underwear and is playing with his nipples.

## 2021-10-05 NOTE — ED Notes (Signed)
RN called pt's mother for him to speak with her.

## 2021-10-05 NOTE — ED Notes (Signed)
VOL/Pending Placement 

## 2021-10-05 NOTE — ED Notes (Signed)
Pt banging on nurse's station door and making hand gestures.  Staff unable to determine pt's needs.

## 2021-10-05 NOTE — Consult Note (Signed)
  Patient stood at door and pointed to chairs when Clinical research associate approached the door. Writer sat in dayroom with patient, as it appeared this was his request. Patient seemed content just to sit beside Clinical research associate. Patient is nonsensical and disorganized in his speech, saying random words, "college", "yeah" "rock and roll." When I asked him if his name is Adam [name], he states "Adam [name]", several times.  Patient was not aggressive or agitated during this time. Patient was agreeable when writer told him it was time for me to leave him for now.

## 2021-10-05 NOTE — ED Notes (Signed)
Pt pushing the chair from his room into the dayroom.

## 2021-10-05 NOTE — ED Notes (Signed)
Patient banging on door and nurses station windows. Patient screaming at staff. Patient came aggressive at this writer when Clinical research associate went into patient's room. Patient given IM medications per Orders with security standing by for safety. Patient took IM medications with no issues willingly.

## 2021-10-05 NOTE — ED Notes (Signed)
Pt refused snack 

## 2021-10-05 NOTE — ED Notes (Signed)
Pt given snack. 

## 2021-10-05 NOTE — ED Notes (Signed)
Pt crying after trying to reach his mom on the phone.  Pt then became angry and banged on the nurse's station door.  Pt walking around the dayroom holding his head and yelling.

## 2021-10-05 NOTE — ED Notes (Signed)
Pt jiggling, pulling and pushing door handle of nurse's station.  Pt yelling "pink illusion mask."  Pt is becoming increasingly agitated.

## 2021-10-05 NOTE — ED Notes (Signed)
Pt sleeping. VS will be taken once awake. 

## 2021-10-05 NOTE — ED Notes (Signed)
Patient coming to nurses station Field seismologist a "Witch" and "Love you Love you no way".

## 2021-10-05 NOTE — ED Notes (Signed)
Pt banging on nurse's station door with both hands.  Pt repeating "memory care."

## 2021-10-05 NOTE — ED Notes (Signed)
Pt at nurse's station door.  Pt is yelling, banging on door and lifting up his shirt.  Pt has been given breakfast and juice.  Pt does not want to call his mother, listen to music or shower.  Staff is unable to re-direct patient or determine why he is upset.  Per EDP pt can get his morning medications early.

## 2021-10-05 NOTE — ED Notes (Signed)
Pt continues to yell and bang on door and window.  Unable to re-direct or calm patient.  PRN IM given as ordered.  EDP and charge nurse made aware.

## 2021-10-05 NOTE — ED Provider Notes (Signed)
Emergency Medicine Observation Re-evaluation Note  Adam [name] is a 60 y.o. male, seen on rounds today.  Pt initially presented to the ED for complaints of Psychiatric Evaluation Currently, the patient is resting comfortably.  Physical Exam  BP 115/85 (BP Location: Right Arm)   Pulse 95   Temp 98 F (36.7 C) (Oral)   Resp 15   Ht 5\' 8"  (1.727 m)   Wt 79.4 kg   SpO2 96%   BMI 26.61 kg/m  Physical Exam Constitutional:      Appearance: He is not ill-appearing or toxic-appearing.  HENT:     Head: Atraumatic.  Cardiovascular:     Comments: Appears well perfused Pulmonary:     Effort: Pulmonary effort is normal.  Abdominal:     General: There is no distension.  Musculoskeletal:        General: No deformity.  Neurological:     General: No focal deficit present.     ED Course / MDM  EKG:EKG Interpretation  Date/Time:  Monday August 28 2021 20:12:52 EDT Ventricular Rate:  117 PR Interval:  162 QRS Duration: 104 QT Interval:  306 QTC Calculation: 426 R Axis:   -17 Text Interpretation: Sinus tachycardia Otherwise normal ECG Confirmed by UNCONFIRMED, DOCTOR (08-25-1991), editor [id number], Tammy [phone]) on 08/29/2021 8:10:23 AM  I have reviewed the labs performed to date as well as medications administered while in observation.  Recent changes in the last 24 hours include none.  Plan  Current plan is for placement.  Tarrance Januszewski is not under involuntary commitment.     [name], MD 10/05/21 [phone]

## 2021-10-05 NOTE — ED Notes (Signed)
NP on unit to speak with patient in dayroom.

## 2021-10-05 NOTE — ED Provider Notes (Signed)
Nurse informs me patient has been banging on the doorway and yelling.  He would not stop.  Nurse was able to give him a shot of Valium Haldol and Benadryl without restraining him.  He is now calm.   Arnaldo Natal, MD 10/05/21 [phone]

## 2021-10-05 NOTE — ED Notes (Signed)
Pt asleep at this time, unable to collect vitals. Will collect pt vitals once awake. 

## 2021-10-06 MED ORDER — LORAZEPAM 2 MG/ML IJ SOLN
INTRAMUSCULAR | Status: AC
  Administered 2021-10-06: 2 mg
  Filled 2021-10-06: qty 1

## 2021-10-06 MED ORDER — LORAZEPAM 2 MG/ML IJ SOLN
2.0000 mg | Freq: Once | INTRAMUSCULAR | Status: AC
  Administered 2021-10-06: 2 mg via INTRAMUSCULAR
  Filled 2021-10-06: qty 1

## 2021-10-06 MED ORDER — LORAZEPAM 2 MG/ML IJ SOLN: 2.0000 mg | Freq: Once | INTRAMUSCULAR | Status: DC

## 2021-10-06 NOTE — ED Notes (Signed)
Pt yelling "Angles my book, angels my book!!"

## 2021-10-06 NOTE — ED Notes (Signed)
Pt continues to yell and is disturbing other patients on the unit.  Unable to be re-directed.  Pt with word salad, "bookshelf, razors, Madonna."  Pt's agitation has elevated.

## 2021-10-06 NOTE — ED Notes (Signed)
Hourly rounding reveals patient in room. No complaints, stable, in no acute distress. Q15 minute rounds and monitoring via Security Cameras to continue. 

## 2021-10-06 NOTE — ED Notes (Signed)
Pt needed help turning the water on in the shower.

## 2021-10-06 NOTE — ED Notes (Signed)
Pt banging on window yelling.  Pt keeps pointing into nurse's station, but staff cannot determine what pt needs.

## 2021-10-06 NOTE — ED Provider Notes (Signed)
Emergency Medicine Observation Re-evaluation Note  Adam [name] is a 60 y.o. male, seen on rounds today.  Pt initially presented to the ED for complaints of Psychiatric Evaluation Currently, the patient is resting.  Physical Exam  BP 105/80 (BP Location: Left Arm)   Pulse 82   Temp 98.3 F (36.8 C) (Oral)   Resp 17   Ht 5\' 8"  (1.727 m)   Wt 79.4 kg   SpO2 98%   BMI 26.61 kg/m  Physical Exam General: nad Cardiac: well perfused Lungs: unlabored Psych: resting  ED Course / MDM  EKG:EKG Interpretation  Date/Time:  Monday August 28 2021 20:12:52 EDT Ventricular Rate:  117 PR Interval:  162 QRS Duration: 104 QT Interval:  306 QTC Calculation: 426 R Axis:   -17 Text Interpretation: Sinus tachycardia Otherwise normal ECG Confirmed by UNCONFIRMED, DOCTOR (08-25-1991), editor [id number], Tammy [phone]) on 08/29/2021 8:10:23 AM  I have reviewed the labs performed to date as well as medications administered while in observation.  Recent changes in the last 24 hours include .  Plan  Current plan is for psyc/soc.  Adam [name] is not under involuntary commitment.     [name], MD 10/06/21 [phone]

## 2021-10-06 NOTE — TOC Progression Note (Signed)
Transition of Care [facility]) - Progression Note    Patient Details  Name: Adam [name] MRN: [id number] Date of Birth: [birth year]  Transition of Care [facility]) CM/SW Contact  Joseph Art, [place] Phone Number: 10/06/2021, 11:32 AM  Clinical Narrative:     CSW left voicemail for [name] w/ [facility] [phone], requesting update on placement process.  CSW asked about funding for one-on-one care giver in a facility.  Expected Discharge Plan: Group Home Barriers to Discharge: Continued Medical Work up  Expected Discharge Plan and Services Expected Discharge Plan: Group Home In-house Referral: Clinical Social Work     Living arrangements for the past 2 months: Group Home                                       Social Determinants of Health (SDOH) Interventions    Readmission Risk Interventions No flowsheet data found.

## 2021-10-06 NOTE — ED Notes (Signed)
Pt given snack. 

## 2021-10-06 NOTE — ED Notes (Signed)
Pt walking around yelling, "Rubbish, yeah rubbish!"

## 2021-10-06 NOTE — ED Notes (Signed)
Pt agitated, yelling and continuously coming to the nurse's station door.

## 2021-10-06 NOTE — ED Notes (Addendum)
IM medications given without incident.  Charge nurse on unit to assist. Security officers on unit, but no manual hold needed. EDP made aware.

## 2021-10-06 NOTE — ED Notes (Signed)
Report to include Situation, Background, Assessment, and Recommendations received from Amy RN. Patient alert and oriented, warm and dry, in no acute distress. Patient denies SI, HI, AVH and pain. Patient made aware of Q15 minute rounds and security cameras for their safety. Patient instructed to come to me with needs or concerns.  

## 2021-10-06 NOTE — ED Notes (Signed)
Patient refused vitals. Will continue to monitor.

## 2021-10-06 NOTE — ED Notes (Signed)
Pt talking to his mother on the phone.

## 2021-10-06 NOTE — ED Notes (Signed)
VOL  PENDING  PLACEMENT 

## 2021-10-06 NOTE — ED Notes (Signed)
Pt banging on nurse's station door and jiggling the door handle.

## 2021-10-06 NOTE — ED Notes (Signed)
Unable to obtain vitals due to patient sleeping. Will continue to monitor.   

## 2021-10-06 NOTE — ED Notes (Signed)
Pt yelling and pacing around unit.  Speech is word salad and staff cannot determine what pt needs.

## 2021-10-07 MED ORDER — LORAZEPAM 2 MG/ML IJ SOLN
2.0000 mg | Freq: Once | INTRAMUSCULAR | Status: AC
  Administered 2021-10-07: 2 mg via INTRAMUSCULAR
  Filled 2021-10-07: qty 1

## 2021-10-07 NOTE — ED Notes (Signed)
Patient given IM medication after verbal de escalation  tactics did not work. Patient was sitting in chair. Writer explained to patient he was receiving medication injections in arms. Patient raised shirt sleeve to obtain medication willingly. Security officer standing by for safety reasons. Patient was not restrained while receiving medications.

## 2021-10-07 NOTE — ED Notes (Signed)
Patient screaming "Adam [name], Girl scout, and Slave." While pointing at Engineer, materials. Patient hitting windows and door at nurses station. Writer tired to deesculate patient. Patient balled up fist and stomped around dayroom. When staff would enter unit patient would try to push himself into nurses station.

## 2021-10-07 NOTE — ED Notes (Signed)
Patient laying in bed with eyes closed. Respirations even and non labored. Snack tray placed at bedside.

## 2021-10-07 NOTE — ED Notes (Signed)
Pt given phone to talk to mom

## 2021-10-07 NOTE — ED Notes (Signed)
Hourly rounding reveals patient in room. No complaints, stable, in no acute distress. Q15 minute rounds and monitoring via Security Cameras to continue. 

## 2021-10-07 NOTE — ED Notes (Signed)
Patient came out from his room upset and saying "Tawni Carnes" very loud. He was saying " people rights". He was disrupting the unit and waking other patients. When asked if he needs anything he said " no way". Staff and this Clinical research associate attempts multiple times to redirect the patient. Patient was unable to redirect. EDP notified.

## 2021-10-07 NOTE — ED Notes (Signed)
Pt volunteered to take medications but did not want vitals. Went back to bed after meds.

## 2021-10-07 NOTE — ED Notes (Addendum)
Pt has slept most all day. RN and tech woke pt in order to get vital signs and give his dinner tray. Pt started rubbing his sheets and tech asked if he needed new sheets. Pt then started yelling "new sheets" over and over but said no when sheets were given to him. Pt is agitated and is not going into room to eat dinner at this moment.  Pt is now walking around unit saying and yelling "kill somebody!"

## 2021-10-07 NOTE — ED Provider Notes (Signed)
Emergency Medicine Observation Re-evaluation Note  Adam [name] is a 60 y.o. male, seen on rounds today.  Pt initially presented to the ED for complaints of Psychiatric Evaluation   Physical Exam  BP (!) 137/91 (BP Location: Right Arm)   Pulse 78   Temp 98.5 F (36.9 C) (Oral)   Resp 20   Ht 5\' 8"  (1.727 m)   Wt 79.4 kg   SpO2 100%   BMI 26.61 kg/m  Physical Exam General: no acute distress Lungs: normal effort Psych: calm   ED Course / MDM  EKG:EKG Interpretation  Date/Time:  Monday August 28 2021 20:12:52 EDT Ventricular Rate:  117 PR Interval:  162 QRS Duration: 104 QT Interval:  306 QTC Calculation: 426 R Axis:   -17 Text Interpretation: Sinus tachycardia Otherwise normal ECG Confirmed by UNCONFIRMED, DOCTOR (08-25-1991), editor [id number], Tammy [phone]) on 08/29/2021 8:10:23 AM  I have reviewed the labs performed to date as well as medications administered while in observation.  Recent changes in the last 24 hours include patient receiving some sedation medication for agitation.  Plan  Current plan is for placement.  Vestal Markin is not under involuntary commitment.     [name], MD 10/07/21 [phone]

## 2021-10-07 NOTE — ED Notes (Signed)
VS not taken, patient asleep 

## 2021-10-07 NOTE — ED Notes (Signed)
Pt is currently still sleeping.  Tech approached bedside and put breakfast tray and drink on chair and tried putting blood pressure cuff on pt but pt yelled out "no" Staff will approach vital situation when pt awakes

## 2021-10-08 NOTE — ED Notes (Signed)
Report to include Situation, Background, Assessment, and Recommendations received from [facility]. Patient oriented X1, warm and dry, in no acute distress. UTA SI, HI, AVH and pain. Patient made aware of Q15 minute rounds and security cameras for their safety. Patient instructed to come to me with needs or concerns.

## 2021-10-08 NOTE — ED Notes (Signed)
Hourly rounding reveals patient in room. No complaints, stable, in no acute distress. Q15 minute rounds and monitoring via Rover and Officer to continue.   

## 2021-10-08 NOTE — ED Notes (Signed)
Report given to Hewan RN 

## 2021-10-08 NOTE — ED Notes (Signed)
Pt received snack and drink 

## 2021-10-08 NOTE — ED Provider Notes (Signed)
Emergency Medicine Observation Re-evaluation Note  Adam [name] is a 60 y.o. male, seen on rounds today.  Pt initially presented to the ED for complaints of Psychiatric Evaluation Currently, the patient is sitting in the common area, laughing   Physical Exam  BP 107/74 (BP Location: Right Arm)   Pulse 83   Temp 99 F (37.2 C) (Oral)   Resp 18   Ht 5\' 8"  (1.727 m)   Wt 79.4 kg   SpO2 100%   BMI 26.61 kg/m  Physical Exam General: pt laughing inappropriately, no distress  Lungs: no resp distress Psych: calm, cooperative  ED Course / MDM  EKG:EKG Interpretation  Date/Time:  Monday August 28 2021 20:12:52 EDT Ventricular Rate:  117 PR Interval:  162 QRS Duration: 104 QT Interval:  306 QTC Calculation: 426 R Axis:   -17 Text Interpretation: Sinus tachycardia Otherwise normal ECG Confirmed by UNCONFIRMED, DOCTOR (08-25-1991), editor [id number], Tammy [phone]) on 08/29/2021 8:10:23 AM  I have reviewed the labs performed to date as well as medications administered while in observation.  Recent changes in the last 24 hours include none  Plan  Current plan is for placement by SW  08/31/2021 is not under involuntary commitment.     [name], MD 10/08/21 [phone]

## 2021-10-08 NOTE — ED Notes (Signed)
Hourly rounding reveals patient in room. No complaints, stable, in no acute distress. Q15 minute rounds and monitoring via Security Cameras to continue. 

## 2021-10-08 NOTE — ED Notes (Signed)
Pt given the phone and assisted pt to dialing mother's phone number that's in the system.

## 2021-10-08 NOTE — ED Notes (Signed)
Per pt's requested paper and crayons were given.

## 2021-10-08 NOTE — ED Notes (Signed)
Pt. Was given phone to talk to mom.

## 2021-10-08 NOTE — ED Notes (Signed)
Pt. Got breakfast tray and this tech was able to collect AM vitals.

## 2021-10-08 NOTE — ED Notes (Signed)
VOL/pending placement 

## 2021-10-08 NOTE — ED Notes (Signed)
Breakfast tray given. °

## 2021-10-08 NOTE — ED Notes (Signed)
Meal tray is given. Patient made his request known that he wanted a cola with his lunch. Patient is calm and cooperative.

## 2021-10-08 NOTE — ED Notes (Signed)
Pt. Requested to use the phone.

## 2021-10-08 NOTE — ED Notes (Signed)
Pt. Got a dinner tray and a drink.

## 2021-10-09 NOTE — ED Notes (Signed)
Pt. Given dinner tray.

## 2021-10-09 NOTE — TOC Progression Note (Addendum)
Transition of Care [facility]) - Progression Note    Patient Details  Name: Adam [name] MRN: [id number] Date of Birth: 09/13/1961  Transition of Care [facility]) CM/SW Contact  [name] Phone Number: [phone] 10/09/2021, 11:12 AM  Clinical Narrative:     CSW spoke with [name] w/ [facility] [phone], to discuss placement options.  [name] stated he has a meeting this week w/ upper level staff, to discuss possible 1:1 funding for the patient, which he hopes will increase placement possibilities.  [name] stated [facility] has approved 1:1 funding in the past with a case similar to this one, and were able to find placement. and to discuss with upper level staff, how to achieve the same for the patient.  [name] stated he will update this CSW after his meeting.   Expected Discharge Plan: Group Home Barriers to Discharge: Continued Medical Work up  Expected Discharge Plan and Services Expected Discharge Plan: Group Home In-house Referral: Clinical Social Work     Living arrangements for the past 2 months: Group Home                                       Social Determinants of Health (SDOH) Interventions    Readmission Risk Interventions No flowsheet data found.

## 2021-10-09 NOTE — ED Notes (Signed)
VOL/pending placement 

## 2021-10-09 NOTE — ED Notes (Signed)
Pt given phone to speak with his mom.

## 2021-10-09 NOTE — ED Notes (Signed)
Report received from Amy, English as a second language teacher. Patient alert and oriented, warm and dry, in no acute distress.  Patient made aware of Q15 minute rounds and security cameras for their safety. Patient instructed to come to this nurse with needs or concerns.

## 2021-10-09 NOTE — ED Notes (Signed)
VS not taken, patient asleep 

## 2021-10-09 NOTE — ED Notes (Signed)
Hourly rounding reveals patient in room. No complaints, stable, in no acute distress. Q15 minute rounds and monitoring via Security Cameras to continue. 

## 2021-10-10 NOTE — ED Notes (Signed)
Pt initially refusing his medications.  RN spoke to pt's mom and then allowed the pt to speak with her on the phone.   Pt took medications without issue after phone call.

## 2021-10-10 NOTE — ED Notes (Signed)
Snack was given. 

## 2021-10-10 NOTE — ED Provider Notes (Signed)
Emergency Medicine Observation Re-evaluation Note  Adam [name] is a 60 y.o. male, seen on rounds today.  Pt initially presented to the ED for complaints of Psychiatric Evaluation  Currently, the patient is calm, no acute complaints.  Physical Exam  Blood pressure 136/90, pulse 80, temperature 97.9 F (36.6 C), temperature source Oral, resp. rate 18, height 5\' 8"  (1.727 m), weight 79.4 kg, SpO2 100 %. Physical Exam General: NAD Lungs: CTAB Psych: not agitated  ED Course / MDM  EKG:    I have reviewed the labs performed to date as well as medications administered while in observation.  Recent changes in the last 24 hours include no acute events overnight.    Plan  Current plan is for SW placement. Patient is not under full IVC at this time.   , MD 10/10/21 [phone]

## 2021-10-10 NOTE — ED Notes (Signed)
VOL  PENDING  PLACEMENT 

## 2021-10-11 NOTE — ED Notes (Signed)
VOL  PENDING  PLACEMENT 

## 2021-10-11 NOTE — ED Notes (Signed)
Unable to obtain vitals due to patient sleeping. Will continue to monitor.   

## 2021-10-11 NOTE — TOC Progression Note (Addendum)
Transition of Care [facility]) - Progression Note    Patient Details  Name: Adam [name] MRN: [id number] Date of Birth: [birth year]  Transition of Care [facility]) CM/SW Contact  [name] Phone Number: [phone] 10/11/2021, 2:17 PM  Clinical Narrative:     CSW contacted [name] w/ [facility] [phone], to discuss placement options and updates on placement search. CSW waiting for reply.  Update:  [name]  replied to this CSW stating he has contacted upper leadership at [employer] and upper level finance and member engagement staff at [employer] will review this case to come up with a plan to create funding which will allow the patient to find placement at residential agencies considered eligible to receive this funding and capable of meeting the patient's needs.  Expected Discharge Plan: Group Home Barriers to Discharge: Continued Medical Work up  Expected Discharge Plan and Services Expected Discharge Plan: Group Home In-house Referral: Clinical Social Work     Living arrangements for the past 2 months: Group Home                                       Social Determinants of Health (SDOH) Interventions    Readmission Risk Interventions No flowsheet data found.

## 2021-10-11 NOTE — ED Provider Notes (Signed)
Emergency Medicine Observation Re-evaluation Note  Adam [name] is a 60 y.o. male, seen in the emergency department for psychiatric evaluation No acute events overnight.  Calm.  Physical Exam  BP (!) 142/95 (BP Location: Left Arm)   Pulse 87   Temp 97.8 F (36.6 C) (Oral)   Resp 18   Ht 5\' 8"  (1.727 m)   Wt 79.4 kg   SpO2 100%   BMI 26.61 kg/m    ED Course / MDM   No recent lab work for review.  Plan  Current plan is for placement to an appropriate living facility.  Hatim Homann is not under involuntary commitment.     [name], MD 10/11/21 [phone]

## 2021-10-11 NOTE — ED Notes (Signed)
VOL/Pending Placement 

## 2021-10-11 NOTE — ED Notes (Addendum)
Pt refusing meds and vitals, keeps screaming "no" several times, will attempt again a little later.

## 2021-10-12 NOTE — ED Notes (Signed)
Pt back to his room.  ?

## 2021-10-12 NOTE — ED Notes (Signed)
Report received from [facility]. Patient care assumed. Patient/RN introduction complete. Will continue to monitor. Pt sitting in room on floor eating crackers. Pt calm at this time, pending disposition.

## 2021-10-12 NOTE — ED Notes (Signed)
Pt given breakfast tray

## 2021-10-12 NOTE — ED Notes (Signed)
Door unlocked for pt to use bathroom. Door locked once pt finished.  °

## 2021-10-12 NOTE — ED Notes (Signed)
Pt given dinner tray.

## 2021-10-12 NOTE — ED Notes (Signed)
Pt to restroom. Gait steady. No distress noted.

## 2021-10-12 NOTE — ED Provider Notes (Signed)
Emergency Medicine Observation Re-evaluation Note  Adam [name] is a 60 y.o. male, seen in the emergency department for psychiatric evaluation. No acute events overnight or today. Physical Exam  BP 99/63 (BP Location: Left Arm)   Pulse 84   Temp 98.1 F (36.7 C) (Oral)   Resp 16   Ht 5\' 8"  (1.727 m)   Wt 79.4 kg   SpO2 99%   BMI 26.61 kg/m    ED Course / MDM   No recent lab work for review  Plan  Current plan is for placement to an appropriate living facility.  Adam [name] is not under involuntary commitment.     [name], MD 10/12/21 1351

## 2021-10-12 NOTE — ED Notes (Signed)
Pt given lunch tray.

## 2021-10-12 NOTE — ED Notes (Signed)
Snack provided. Pt restless and talkative. Given crayons and coloring book. Smiling and appreciative.

## 2021-10-13 NOTE — TOC Progression Note (Signed)
Transition of Care [facility]) - Progression Note    Patient Details  Name: Adam [name] MRN: [id number] Date of Birth: March 19, 1961  Transition of Care [facility]) CM/SW Contact  Joseph Art, [place] Phone Number: 10/13/2021, 12:00 PM  Clinical Narrative:     [name] w/ [facility] [phone] is working with [facility] leadership to secure funding for 1:1 support for patient, to assist in finding placement.  Placement pending funding resources for 1:1 care.   Expected Discharge Plan: Group Home Barriers to Discharge: Continued Medical Work up  Expected Discharge Plan and Services Expected Discharge Plan: Group Home In-house Referral: Clinical Social Work     Living arrangements for the past 2 months: Group Home                                       Social Determinants of Health (SDOH) Interventions    Readmission Risk Interventions No flowsheet data found.

## 2021-10-13 NOTE — ED Notes (Signed)
Pt given dinner tray.

## 2021-10-13 NOTE — ED Notes (Signed)
VOL/Pending Placement 

## 2021-10-14 MED ORDER — LORAZEPAM 2 MG/ML IJ SOLN
INTRAMUSCULAR | Status: AC
  Filled 2021-10-14: qty 1

## 2021-10-14 MED ORDER — LORAZEPAM 2 MG/ML IJ SOLN
2.0000 mg | Freq: Once | INTRAMUSCULAR | Status: AC
  Administered 2021-10-14: 2 mg via INTRAMUSCULAR

## 2021-10-14 NOTE — ED Notes (Signed)
Hourly rounding reveals patient in room. No complaints, stable, in no acute distress. Q15 minute rounds and monitoring via Security Cameras to continue. 

## 2021-10-14 NOTE — ED Notes (Signed)
Patient refused his HS medications at this time. Will try again.

## 2021-10-14 NOTE — ED Notes (Signed)
Vol /pending placement 

## 2021-10-14 NOTE — ED Notes (Signed)
Patient is very disruptive and loud. He is rolling his shirt and asking for medication. EDP notified

## 2021-10-14 NOTE — ED Notes (Signed)
Report to amy, rn

## 2021-10-14 NOTE — ED Notes (Signed)
Patient is in the dayroom pacing and saying "riche biomore and Madonna". He asked for juice and this Clinical research associate gave him apple juice. Will continue to monitor.

## 2021-10-14 NOTE — ED Notes (Signed)
Pt spoke with his mom on the phone.

## 2021-10-14 NOTE — ED Notes (Signed)
Report to include Situation, Background, Assessment, and Recommendations received from Amy RN. Patient alert and oriented, warm and dry, in no acute distress. Patient denies SI, HI, AVH and pain. Patient made aware of Q15 minute rounds and security cameras for their safety. Patient instructed to come to me with needs or concerns.  

## 2021-10-14 NOTE — ED Notes (Signed)
Snack and beverage given. 

## 2021-10-15 NOTE — ED Notes (Signed)
Hourly rounding reveals patient in room. No complaints, stable, in no acute distress. Q15 minute rounds and monitoring via Security Cameras to continue. 

## 2021-10-15 NOTE — ED Notes (Signed)
Pt up to bathroom.

## 2021-10-15 NOTE — ED Notes (Signed)
Pt starting to get agitated.  Pt does not want to listen to music or call his mom.

## 2021-10-15 NOTE — ED Provider Notes (Signed)
Emergency Medicine Observation Re-evaluation Note  Adam [name] is a 60 y.o. male, seen on rounds today.  Pt initially presented to the ED for complaints of Psychiatric Evaluation Currently, the patient is sitting on the side of his bed, denies complaints.  Physical Exam  BP 123/81   Pulse (!) 102   Temp 97.9 F (36.6 C) (Oral)   Resp 18   Ht 5\' 8"  (1.727 m)   Wt 79.4 kg   SpO2 97%   BMI 26.61 kg/m  Physical Exam Constitutional: Resting comfortably. Eyes: Conjunctivae are normal. Head: Atraumatic. Nose: No congestion/rhinnorhea. Mouth/Throat: Mucous membranes are moist. Neck: Normal ROM Cardiovascular: No cyanosis noted. Respiratory: Normal respiratory effort. Gastrointestinal: Non-distended. Genitourinary: deferred Musculoskeletal: No lower extremity tenderness nor edema. Neurologic:  Normal speech and language. No gross focal neurologic deficits are appreciated. Skin:  Skin is warm, dry and intact. No rash noted.   ED Course / MDM  EKG:EKG Interpretation  Date/Time:  Monday August 28 2021 20:12:52 EDT Ventricular Rate:  117 PR Interval:  162 QRS Duration: 104 QT Interval:  306 QTC Calculation: 426 R Axis:   -17 Text Interpretation: Sinus tachycardia Otherwise normal ECG Confirmed by UNCONFIRMED, DOCTOR (08-25-1991), editor [id number], Tammy [phone]) on 08/29/2021 8:10:23 AM  I have reviewed the labs performed to date as well as medications administered while in observation.  Recent changes in the last 24 hours include none.  Plan  Current plan is for dispo per social work.  Korby Ratay is not under involuntary commitment.     [name], MD 10/15/21 1055

## 2021-10-15 NOTE — ED Notes (Signed)
Pt agreeable to taking medications, but refuses VS at this time.

## 2021-10-15 NOTE — ED Notes (Signed)
Pt brought linen to nurse's station.  Clean linen provided.  Pt able to make his bed.

## 2021-10-15 NOTE — ED Notes (Signed)
Snack and beverage given. 

## 2021-10-15 NOTE — ED Notes (Signed)
Report to include Situation, Background, Assessment, and Recommendations received from Amy RN. Patient alert and oriented, warm and dry, in no acute distress. UTA SI, HI, AVH and pain. Patient made aware of Q15 minute rounds and security cameras for their safety. Patient instructed to come to me with needs or concerns.

## 2021-10-15 NOTE — ED Notes (Signed)
Patient lifting his shirt and showing his chest and abdomen. When this writer asked if his stomach is hurting he said yes. He also pointed at his chest and this writer noted a scratch and rash across his chest. VS within define limits. EDP noified

## 2021-10-15 NOTE — ED Notes (Signed)
VOL/pending placement 

## 2021-10-15 NOTE — ED Notes (Signed)
Meal tray given 

## 2021-10-15 NOTE — ED Notes (Signed)
VS not taken, patient asleep 

## 2021-10-16 MED ORDER — LORAZEPAM 2 MG/ML IJ SOLN
2.0000 mg | Freq: Once | INTRAMUSCULAR | Status: AC
  Administered 2021-10-16: 2 mg via INTRAMUSCULAR
  Filled 2021-10-16: qty 1

## 2021-10-16 NOTE — ED Notes (Signed)
Hourly rounding reveals patient in room. No complaints, stable, in no acute distress. Q15 minute rounds and monitoring via Security Cameras to continue. 

## 2021-10-16 NOTE — ED Notes (Signed)
Unlocked bathroom door to allow patient to shower.    Pt was given hygiene items as well as:  I clean top, 1 clean bottom, with 1 pair of disposable underwear.  Pt changed out into clean clothing.  Staff disposed of all shower supplies. Shower room cleaned and secured for next use.  

## 2021-10-16 NOTE — ED Notes (Signed)
Snack tray given with ice cream and juice.

## 2021-10-16 NOTE — ED Notes (Signed)
Took medications, given phone and breakfast. Declines further needs at this time.

## 2021-10-16 NOTE — ED Notes (Signed)
Patient screaming, "prison, prison" patient redirected multiple times. By this Clinical research associate and fellow RN Hewan.

## 2021-10-16 NOTE — ED Notes (Signed)
Pt in dayroom waiting to use restroom, is occupied. Pt to restroom when available, pt back to room and door to restroom closed.

## 2021-10-16 NOTE — ED Notes (Signed)
VS not taken, patient asleep 

## 2021-10-16 NOTE — ED Provider Notes (Signed)
Emergency Medicine Observation Re-evaluation Note  Lucio Litsey is a 60 y.o. male, seen on rounds today.  Pt initially presented to the ED for complaints of Psychiatric Evaluation   Physical Exam  BP (!) 150/100 (BP Location: Left Arm)   Pulse 76   Temp 97.7 F (36.5 C) (Oral)   Resp 20   Ht 5\' 8"  (1.727 m)   Wt 79.4 kg   SpO2 100%   BMI 26.61 kg/m  Physical Exam General: no acute distress Lungs: normal effort Psych: calm  ED Course / MDM  EKG:EKG Interpretation  Date/Time:  Monday August 28 2021 20:12:52 EDT Ventricular Rate:  117 PR Interval:  162 QRS Duration: 104 QT Interval:  306 QTC Calculation: 426 R Axis:   -17 Text Interpretation: Sinus tachycardia Otherwise normal ECG Confirmed by UNCONFIRMED, DOCTOR (08-25-1991), editor [id number], Tammy [phone]) on 08/29/2021 8:10:23 AM  I have reviewed the labs performed to date as well as medications administered while in observation.  Recent changes in the last 24 hours include none.  Plan  Current plan is for social work dispo.  Javione Gunawan is not under involuntary commitment.     [name], MD 10/16/21 [phone]

## 2021-10-16 NOTE — ED Notes (Signed)
Pt escalating, attempting to deescalate by providing music in dayroom, offering remote and snack. Declines. Speaking loudly and banging on doors.

## 2021-10-16 NOTE — ED Notes (Signed)
Dinner tray along with soda and water given.

## 2021-10-16 NOTE — ED Notes (Signed)
Up to bathroom and back to room. Attempted to engage in patient conversation, but failed. Pt went back to room and laid down.

## 2021-10-16 NOTE — ED Notes (Signed)
Report to include Situation, Background, Assessment, and Recommendations received from [facility]. Patient alert and oriented, warm and dry, in no acute distress. UTA SI, HI, AVH and pain. Patient made aware of Q15 minute rounds and security cameras for their safety. Patient instructed to come to me with needs or concerns.

## 2021-10-16 NOTE — ED Notes (Signed)
Tried to call mother ([place]). Phone rang and when went to voicemail with full voicemail. Writer told patient we would try again after a bit. Patient said ok and went to room.

## 2021-10-16 NOTE — ED Notes (Signed)
VOL  PENDING  PLACEMENT 

## 2021-10-16 NOTE — ED Notes (Signed)
This tech and [employer], attempted to obtain vitals. Pt refusing and raising voice saying "no". Pt informed that we will try later. Pt shouting "six, six, six". Will continue to monitor.

## 2021-10-16 NOTE — ED Notes (Signed)
Unlocked bathroom door to allow patient to use restroom. Pt exited and bathroom door locked behind patient by staff.  

## 2021-10-16 NOTE — ED Notes (Signed)
Patient provided snack at appropriate snack time.  Pt consumed 100% of snack provided, tolerated well w/o complaints   Trash disposted of appropriately by patient.  

## 2021-10-16 NOTE — Progress Notes (Signed)
   10/16/21 1300  Clinical Encounter Type  Visited With Patient  Visit Type Follow-up;Spiritual support;Social support  Referral From Other (Comment) (rounding)  Chaplain Burris checked-in on Pt who was glad for the visit. Visit was brief, Pt going to shower. Will f/u later this week.

## 2021-10-17 MED ORDER — LORAZEPAM 2 MG/ML IJ SOLN
2.0000 mg | Freq: Once | INTRAMUSCULAR | Status: AC
  Administered 2021-10-17: 2 mg via INTRAMUSCULAR
  Filled 2021-10-17: qty 1

## 2021-10-17 NOTE — ED Provider Notes (Signed)
Emergency Medicine Observation Re-evaluation Note  Kiel Cockerell is a 60 y.o. male, seen on rounds today.  Pt initially presented to the ED for complaints of   Physical Exam  BP (!) 126/100 (BP Location: Left Arm)   Pulse 100   Temp 98.5 F (36.9 C) (Oral)   Resp 16   Ht 5\' 8"  (1.727 m)   Wt 79.4 kg   SpO2 97%   BMI 26.61 kg/m  Physical Exam General: no acute distress Lungs: respirations are non-labored Psych: calm and cooperative  ED Course / MDM  EKG:  I have reviewed the labs performed to date as well as medications administered while in observation.  Recent changes in the last 24 hours include no changes  Plan  Current plan is for SW placement.   Ramin Zoll is not under involuntary commitment.     [name], MD 10/17/21 [phone]

## 2021-10-17 NOTE — ED Notes (Signed)
Hospital meal provided.  100% consumed, pt tolerated w/o complaints.  Waste discarded appropriately.   

## 2021-10-17 NOTE — ED Notes (Signed)
Hourly rounding reveals patient in room. No complaints, stable, in no acute distress. Q15 minute rounds and monitoring via Security Cameras to continue. 

## 2021-10-17 NOTE — ED Notes (Signed)
Resumed care from [facility] r[facility].  Pt in dayroom standing at door.

## 2021-10-17 NOTE — ED Notes (Signed)
Unlocked bathroom door to allow patient to shower.    Pt was given hygiene items as well as:  I clean top, 1 clean bottom, with 1 pair of disposable underwear.  Pt changed out into clean clothing.  Staff disposed of all shower supplies. Shower room cleaned and secured for next use.  

## 2021-10-17 NOTE — ED Notes (Signed)
Offered shower. 

## 2021-10-17 NOTE — ED Notes (Signed)
Pt in dayroom sitting in a chair

## 2021-10-17 NOTE — ED Notes (Signed)
Vol/Pending Placement  

## 2021-10-17 NOTE — TOC Progression Note (Signed)
Transition of Care [facility]) - Progression Note    Patient Details  Name: Adam [name] MRN: [id number] Date of Birth: 06/11/1961  Transition of Care [facility]) CM/SW Contact  Joseph Art, [place] Phone Number: 10/17/2021, 2:01 PM  Clinical Narrative:     CSW received email from Jackelyn Knife w/ [facility] [phone], requesting clinical information for possible placement at [facility].  CSW gathered clinical information and emailed it to Mr. Meyer, [email or web address]  Expected Discharge Plan: Group Home Barriers to Discharge: Continued Medical Work up  Expected Discharge Plan and Services Expected Discharge Plan: Group Home In-house Referral: Clinical Social Work     Living arrangements for the past 2 months: Group Home                                       Social Determinants of Health (SDOH) Interventions    Readmission Risk Interventions No flowsheet data found.

## 2021-10-17 NOTE — [place] FL2 (Signed)
[place] MEDICAID FL2 LEVEL OF CARE SCREENING TOOL     IDENTIFICATION  Patient Name: Adam [name] Birthdate: Jul 03, 1961 Sex: male Admission Date (Current Location): 07/20/2021  [facility] and [place] Number:  Chiropodist and Address:  [facility], [address] [place], [place] [address]      Provider Number: [id number]  Attending Physician Name and Address:  No att. 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/25/2021   Schizophrenia, chronic condition with acute exacerbation (HCC) 07/20/2021    Orientation RESPIRATION BLADDER Height & Weight     Self, Place  Normal Continent Weight: 175 lb (79.4 kg) Height:  5\' 8"  (172.7 cm)  BEHAVIORAL SYMPTOMS/MOOD NEUROLOGICAL BOWEL NUTRITION STATUS  Wanderer   Continent Diet  AMBULATORY STATUS COMMUNICATION OF NEEDS Skin   Independent Verbally Normal                       Personal Care Assistance Level of Assistance  Bathing, Feeding, Dressing, Total care Bathing Assistance: Independent Feeding assistance: Independent Dressing Assistance: Independent Total Care Assistance: Independent   Functional Limitations Info  Sight, Speech, Hearing Sight Info: Adequate Hearing Info: Adequate Speech Info: Adequate    SPECIAL CARE FACTORS FREQUENCY                       Contractures Contractures Info: Not present    Additional Factors Info  Psychotropic    Followed by [facility] [phone], [email or web address]           Current Medications (10/17/2021):  This  is the current hospital active medication list Current Facility-Administered Medications  Medication Dose Route Frequency Provider Last Rate Last Admin   acetaminophen (TYLENOL) tablet 1,000 mg  1,000 mg Oral Q6H PRN Ward, Kristen N, DO   1,000 mg at 10/04/21 1853   benztropine (COGENTIN) tablet 0.5 mg  0.5 mg Oral BID 13/02/22, NP   0.5 mg at 10/17/21 1042   cholecalciferol (VITAMIN D3) tablet 1,000 Units  1,000 Units Oral Daily 10/19/21, NP   1,000 Units at 10/17/21 1041   diphenhydrAMINE (BENADRYL) injection 50 mg  50 mg Intramuscular Q6H PRN [name], [name], MD   50 mg at 10/16/21 1932   divalproex (DEPAKOTE) DR tablet 500 mg  500 mg Oral Q12H 10/18/21, NP   500 mg at 10/17/21 1041   famotidine (PEPCID) tablet 20 mg  20 mg Oral BID PRN 10/19/21, NP   20 mg at 10/11/21 1114   folic acid (FOLVITE) tablet 1 mg  1 mg Oral Daily 13/09/22, NP   1 mg at 10/16/21 1611   gabapentin (NEURONTIN) tablet 300 mg  300 mg Oral TID 10/18/21, RPH   300 mg at 10/17/21 1041   haloperidol (HALDOL) tablet 7 mg  7 mg Oral TID 10/19/21, NP   7 mg at 10/17/21 1041   Or   haloperidol lactate (HALDOL) injection 5 mg  5 mg Intramuscular TID 10/19/21, NP   5 mg  at 09/26/21 2111   haloperidol lactate (HALDOL) injection 5 mg  5 mg Intramuscular Q6H PRN [name], [name], MD   5 mg at 10/16/21 1931   lisinopril (ZESTRIL) tablet 5 mg  5 mg Oral Daily [name], [name], MD   5 mg at 10/16/21 1610   LORazepam (ATIVAN) tablet 1 mg  1 mg Oral TID Charm Rings, NP   1 mg at 10/17/21 1041   multivitamin with minerals tablet 1 tablet  1 tablet Oral Daily Charm Rings, NP   1 tablet at 10/17/21 1041   paliperidone (INVEGA) 24 hr tablet 6 mg  6 mg Oral Q1400 Charm Rings, NP   6 mg at 10/16/21 1610   temazepam (RESTORIL) capsule 15 mg  15 mg Oral QHS Otelia Sergeant, RPH   15 mg at 10/16/21 2132   thiamine tablet 100 mg  100 mg Oral Daily Charm Rings, NP   100 mg at  10/17/21 1041   ziprasidone (GEODON) injection 20 mg  20 mg Intramuscular Q12H PRN [name], [name], MD   20 mg at 09/25/21 1304   Current Outpatient Medications  Medication Sig Dispense Refill   benztropine (COGENTIN) 1 MG tablet Take 1 mg by mouth daily.     cholecalciferol (VITAMIN D3) 25 MCG (1000 UNIT) tablet Take 1,000 Units by mouth daily.     famotidine (PEPCID) 20 MG tablet Take 20 mg by mouth 2 (two) times daily as needed for heartburn or indigestion.     folic acid (FOLVITE) 1 MG tablet Take 1 mg by mouth daily.     gabapentin (NEURONTIN) 300 MG capsule Take 300 mg by mouth 3 (three) times daily.     haloperidol (HALDOL) 5 MG tablet Take 5 mg by mouth 3 (three) times daily.     LORazepam (ATIVAN) 1 MG tablet Take 1 mg by mouth 3 (three) times daily.     Multiple Vitamin (MULTIVITAMIN) tablet Take 1 tablet by mouth daily.     paliperidone (INVEGA SUSTENNA) 234 MG/1.5ML SUSY injection Inject 234 mg into the muscle every 28 (twenty-eight) days.     thiamine (VITAMIN B-1) 100 MG tablet Take 100 mg by mouth daily.     traZODone (DESYREL) 150 MG tablet Take 150 mg by mouth at bedtime.       Discharge Medications: Please see discharge summary for a list of discharge medications.  Relevant Imaging Results:  Relevant Lab Results:   Additional Information SS# [id number]  Joseph Art, LCSWA

## 2021-10-17 NOTE — ED Notes (Signed)
Patient screaming, pushing on staff, trying to get out door in [place]. Patient redirected by this Clinical research associate and security. Patient continues with behaviors.

## 2021-10-17 NOTE — ED Notes (Signed)
Meds given  pt in dayroom  pt calm and cooperative.

## 2021-10-17 NOTE — ED Notes (Signed)
VS not taken, patient asleep 

## 2021-10-17 NOTE — ED Notes (Signed)
Patient provided snack at appropriate snack time.  Pt consumed 100% of snack provided, tolerated well w/o complaints   Trash disposted of appropriately by patient.  

## 2021-10-17 NOTE — ED Notes (Signed)
Pt on phone with mother. 

## 2021-10-18 MED ORDER — MIDAZOLAM HCL 2 MG/2ML IJ SOLN
4.0000 mg | Freq: Once | INTRAMUSCULAR | Status: AC
  Administered 2021-10-18: 4 mg via INTRAMUSCULAR
  Filled 2021-10-18: qty 4

## 2021-10-18 MED ORDER — HALOPERIDOL LACTATE 5 MG/ML IJ SOLN
5.0000 mg | Freq: Once | INTRAMUSCULAR | Status: AC
  Administered 2021-10-18: 5 mg via INTRAMUSCULAR
  Filled 2021-10-18: qty 1

## 2021-10-18 NOTE — ED Notes (Signed)
Pt banging on [place] door trying to get out and yelling "mama", "lucifer". Taking his shirt off in dayroom requiring redirection.

## 2021-10-18 NOTE — ED Notes (Addendum)
Pt yelling loudly, banging on [place] door and nurses station door. Pt shaking the door handle to nurses station and will not listen to redirection. Redirection attempted but ineffective. Pt offered po meds but he refused them yelling "no" several times. PRN geodon and benadryl given. Pt took IM meds willingly.

## 2021-10-18 NOTE — ED Notes (Signed)
Patient received IM medications per one time orders. Patient held security hands for comfort and safety of safe medication administration.

## 2021-10-18 NOTE — ED Notes (Signed)
Pt had to be watched closely after he spit pills back into the cup of water and set the cup aside. Pt needed lots of encouragement to take all the pills, was attempting not to take the haldol.

## 2021-10-18 NOTE — ED Provider Notes (Signed)
Patient has become acutely agitated again screaming at the top of his voice being on the doors.  For his safety and that of other patients in the near vicinity will order IM Versed and Haldol.   Willy Eddy, MD 10/18/21 2059

## 2021-10-18 NOTE — ED Notes (Signed)
Patient banging on Adam [name] door. Patient redirected. Behaviors continue.

## 2021-10-18 NOTE — ED Notes (Signed)
Patient currently resting in bed with eyes closed. Will continue to monitor.   ENVIRONMENTAL ASSESSMENT Potentially harmful objects out of patient reach: Yes.   Personal belongings secured: Yes.   Patient dressed in hospital provided attire only: Yes.   Plastic bags out of patient reach: Yes.   Patient care equipment (cords, cables, call bells, lines, and drains) shortened, removed, or accounted for: Yes.   Equipment and supplies removed from bottom of stretcher: Yes.   Potentially toxic materials out of patient reach: Yes.   Sharps container removed or out of patient reach: Yes.

## 2021-10-18 NOTE — ED Provider Notes (Signed)
-----------------------------------------   1:44 PM on 10/18/2021 ----------------------------------------- Patient has become acutely agitated banging on the door.  Patient received his as needed oral medications at 11 AM per nurse.  Patient has Geodon and Benadryl IM medications ordered as needed.  Discussed with the nurse and agreed to proceed with 20 mg of Geodon 50 mg of Benadryl IM given his degree of agitation currently.   Minna Antis, MD 10/18/21 1345

## 2021-10-18 NOTE — ED Notes (Signed)
Unable to obtain vitals due to patient sleeping. Will continue to monitor.   

## 2021-10-18 NOTE — ED Notes (Signed)
Patient screaming in room and in day room. Patient trying to open [facility] door.

## 2021-10-18 NOTE — ED Notes (Signed)

## 2021-10-18 NOTE — ED Notes (Signed)
Pt refused vitals, will attempt later

## 2021-10-18 NOTE — ED Notes (Signed)
Pt given chocolate ice cream in a styrofoam cup and meal tray.

## 2021-10-19 NOTE — ED Notes (Signed)
Pt was asleep. Will attempt to get vitals when pt wakes up.

## 2021-10-19 NOTE — ED Notes (Signed)
VOL/Pending Placement 

## 2021-10-19 NOTE — ED Notes (Addendum)
Pt yelling, "Freedom."  Pt repeatedly going to [facility]ally port door and asking for his clothes.

## 2021-10-19 NOTE — ED Provider Notes (Signed)
Emergency Medicine Observation Re-evaluation Note  Dezmon Conover is a 60 y.o. male, seen on rounds today.   Physical Exam  BP (!) 151/96 (BP Location: Right Arm)   Pulse 80   Temp 97.7 F (36.5 C) (Oral)   Resp 20   Ht 5\' 8"  (1.727 m)   Wt 79.4 kg   SpO2 100%   BMI 26.61 kg/m  Physical Exam General: Patient resting comfortably in bed Lungs: Patient in no respiratory distress Psych: Patient not currently combative  ED Course / MDM    Plan  Current plan is for social work placement  Trayquan Kolakowski is not under involuntary commitment.     [name], MD 10/19/21 [phone]

## 2021-10-19 NOTE — ED Notes (Signed)
Pt  took his medications with his mother on the phone.

## 2021-10-19 NOTE — ED Notes (Signed)
RN called and spoke with pt's mother about pt's agitation.  Pt on the phone with his mom now.

## 2021-10-19 NOTE — ED Notes (Signed)
Pt still refuses VS.

## 2021-10-19 NOTE — ED Notes (Signed)
Patient and medication scanned for administration.  Pt agitated and refusing meds and VS at this time.  Will try again when pt calm.

## 2021-10-19 NOTE — ED Notes (Signed)
Pt given a cup of ice water

## 2021-10-19 NOTE — ED Notes (Signed)
Pt sitting on his bed. Not yelling  at this time.

## 2021-10-19 NOTE — ED Notes (Addendum)
Pt attempting to enter nurse's station each time a staff member enters or exists the unit.   Staff unable to to re-direct pt away form nurse's station.  Pt yelling and agitated. EDP and charge nurse made aware.

## 2021-10-19 NOTE — ED Notes (Signed)
Pt still refusing VS 

## 2021-10-19 NOTE — ED Notes (Signed)
Patient given sandwich tray and a cup of water.

## 2021-10-19 NOTE — ED Notes (Signed)
Pt fixated on getting out of the sally port door.  Pt is crying and yelling on unit.  Pt continues to yell "phone" after just speaking to his mother for 10-15 minutes.

## 2021-10-19 NOTE — ED Notes (Signed)
VOl pending placement 

## 2021-10-19 NOTE — ED Provider Notes (Signed)
-----------------------------------------   11:47 AM on 10/19/2021 ----------------------------------------- Patient becoming violent and yelling and banging on doors and screaming at staff and patients.  I have ordered a manual hold so that we can give him his IM medication.  Additionally have consulted psychiatry to see if there is a way to change his daily medications to keep this from happening as frequently.   Arnaldo Natal, MD 10/19/21 [phone]

## 2021-10-19 NOTE — ED Notes (Addendum)
Pt banged on sally port door and yelled "Bullshit!"

## 2021-10-20 MED ORDER — LORAZEPAM 2 MG/ML IJ SOLN
2.0000 mg | Freq: Once | INTRAMUSCULAR | Status: AC
  Administered 2021-10-20: 2 mg via INTRAMUSCULAR
  Filled 2021-10-20: qty 1

## 2021-10-20 NOTE — ED Notes (Signed)
Hourly rounding reveals patient in room. No complaints, stable, in no acute distress. Q15 minute rounds and monitoring via Security Cameras to continue. 

## 2021-10-20 NOTE — ED Notes (Signed)
Pt to nurse's station repeating "[place]" and "check it."

## 2021-10-20 NOTE — ED Provider Notes (Signed)
Emergency Medicine Observation Re-evaluation Note  Adam [name] is a 60 y.o. male, seen on rounds today.  Pt initially presented to the ED for complaints of Psychiatric Evaluation Currently, the patient is talking on the phone with his mother, denies complaints.  Physical Exam  BP (!) 143/93 (BP Location: Right Arm)   Pulse 74   Temp 98 F (36.7 C) (Oral)   Resp 18   Ht 5\' 8"  (1.727 m)   Wt 79.4 kg   SpO2 100%   BMI 26.61 kg/m  Physical Exam Constitutional: Resting comfortably. Eyes: Conjunctivae are normal. Head: Atraumatic. Nose: No congestion/rhinnorhea. Mouth/Throat: Mucous membranes are moist. Neck: Normal ROM Cardiovascular: No cyanosis noted. Respiratory: Normal respiratory effort. Gastrointestinal: Non-distended. Genitourinary: deferred Musculoskeletal: No lower extremity tenderness nor edema. Neurologic:  Normal speech and language. No gross focal neurologic deficits are appreciated. Skin:  Skin is warm, dry and intact. No rash noted.   ED Course / MDM  EKG:EKG Interpretation  Date/Time:  Monday August 28 2021 20:12:52 EDT Ventricular Rate:  117 PR Interval:  162 QRS Duration: 104 QT Interval:  306 QTC Calculation: 426 R Axis:   -17 Text Interpretation: Sinus tachycardia Otherwise normal ECG Confirmed by UNCONFIRMED, DOCTOR (08-[birth year]-1991), editor [id number], Tammy [phone]) on 08/29/2021 8:10:23 AM  I have reviewed the labs performed to date as well as medications administered while in observation.  Recent changes in the last 24 hours include none.  Plan  Current plan is for dispo per social work.  Teri Diltz is not under involuntary commitment.     [name], MD 10/20/21 1113

## 2021-10-20 NOTE — ED Notes (Signed)
Report to include Situation, Background, Assessment, and Recommendations received from Amy RN. Patient alert and oriented, warm and dry, in no acute distress. UTA SI, HI, AVH and pain. Patient made aware of Q15 minute rounds and security cameras for their safety. Patient instructed to come to me with needs or concerns.

## 2021-10-20 NOTE — ED Notes (Signed)
Vol /pending placement 

## 2021-10-20 NOTE — Progress Notes (Signed)
   10/20/21 1540  Clinical Encounter Type  Visited With Patient  Visit Type Follow-up;Spiritual support;Social support  Referral From Other (Comment) (rounding)  Spiritual Encounters  Spiritual Needs Other (Comment) (social support)  Chaplain Burris f/u with Pt to offer social engagement and encouragement. Chaplain Burris also encouraged movement and release/relaxation of stress by attempting to model behaviors and allow Pt to mirror. This was done with deep breathing and also some stretching, shoulder shrugs and free movement. Luna Fuse also spent part of the time in verbal engagement and touching hands to provide sense of connection and care. Will continue to follow.

## 2021-10-20 NOTE — ED Notes (Signed)
Pt continuously coming to nurse's station yelling.  Difficult to understand what patient needs.

## 2021-10-20 NOTE — ED Notes (Signed)
Pt woke up agitated and crying.  Allowed VS to be taken and was agreeable to take his medications with encouragement from staff.  Pt currently speaking with his mom on the phone.

## 2021-10-20 NOTE — ED Notes (Signed)
Staff and officer found medication in patient room. Patient became really upset screaming and yelling "no way". He started banging on the window and door of the nursing station. Staff and officer unable to redirect the patient. MD notified.

## 2021-10-20 NOTE — ED Notes (Signed)
Snack and beverage given. 

## 2021-10-20 NOTE — ED Notes (Signed)
Patient refusing vitals at this time. Will continue to monitor.

## 2021-10-20 NOTE — ED Notes (Signed)
Unable to check vital signs at this time.

## 2021-10-21 MED ORDER — CLONAZEPAM 1 MG PO TABS
2.0000 mg | ORAL_TABLET | Freq: Once | ORAL | Status: AC
  Administered 2021-10-21: 2 mg via ORAL
  Filled 2021-10-21: qty 2

## 2021-10-21 NOTE — ED Notes (Signed)
Hourly rounding reveals patient in room. No complaints, stable, in no acute distress. Q15 minute rounds and monitoring via Security Cameras to continue. 

## 2021-10-21 NOTE — ED Notes (Signed)
VOL/pending placement 

## 2021-10-21 NOTE — ED Notes (Signed)
VS not taken, patient asleep 

## 2021-10-21 NOTE — ED Notes (Signed)
Pt refused vitals, will attempt again later

## 2021-10-21 NOTE — ED Notes (Signed)
Pt took shower with much direction from staff.

## 2021-10-21 NOTE — ED Notes (Signed)
Pt getting agitated, coming to nurses station several times banging on door but staff unable to determine what he wants. Offered several options and pt yelling "no". Hitting [facility] door and the door leading to the back room of the unit. Pacing the dayroom angrily while glaring at staff. Offered meds but he refused and walked to his room yelling "no". Attempted to give meds again and pt agreed to take them. Given klonopin 2 mg instead of ativan 1 mg per Celso Amy, NP to see if pt will settle down.

## 2021-10-21 NOTE — ED Notes (Signed)
Report to include Situation, Background, Assessment, and Recommendations received from [facility]. Patient alert and oriented, warm and dry, in no acute distress. UTA SI, HI, AVH and pain. Patient made aware of Q15 minute rounds and security cameras for their safety. Patient instructed to come to me with needs or concerns.

## 2021-10-22 NOTE — ED Notes (Signed)
Pt in shower.  

## 2021-10-22 NOTE — ED Notes (Signed)
Hourly rounding reveals patient in room. No complaints, stable, in no acute distress. Q15 minute rounds and monitoring via Security Cameras to continue. 

## 2021-10-22 NOTE — ED Notes (Addendum)
Pt was given lunch tray and spilled cup of water on floor, this RN and Brad with security tried to take pt a new cup of water and clean up mess but pt yelled "no" and "go away"

## 2021-10-22 NOTE — ED Notes (Signed)
Went to give pt medications and pt attempted to cheek meds and accidentally got them in the water cup- pt became upset and attempted to drink all the water to get the pills at the bottom- unsure which pills pt was able to swallow and which ones he did not

## 2021-10-22 NOTE — ED Notes (Signed)
NT collected trash

## 2021-10-22 NOTE — ED Provider Notes (Signed)
Emergency Medicine Observation Re-evaluation Note  Adam [name] is a 60 y.o. male, seen on rounds today.  Pt initially presented to the ED for complaints of Psychiatric Evaluation Currently, the patient is calm, resting.  Physical Exam  BP 127/72 (BP Location: Left Arm)   Pulse 91   Temp 98.2 F (36.8 C) (Oral)   Resp 20   Ht 5\' 8"  (1.727 m)   Wt 79.4 kg   SpO2 100%   BMI 26.61 kg/m    ED Course / MDM  EKG:EKG Interpretation  Date/Time:  Monday August 28 2021 20:12:52 EDT Ventricular Rate:  117 PR Interval:  162 QRS Duration: 104 QT Interval:  306 QTC Calculation: 426 R Axis:   -17 Text Interpretation: Sinus tachycardia Otherwise normal ECG Confirmed by UNCONFIRMED, DOCTOR (08-25-1991), editor [id number], Tammy [phone]) on 08/29/2021 8:10:23 AM  I have reviewed the labs performed to date as well as medications administered while in observation.  Recent changes in the last 24 hours include none.  Plan  Current plan is for social work disposition.  Adam [name] is not under involuntary commitment.     [name], MD 10/22/21 [phone]

## 2021-10-22 NOTE — ED Notes (Signed)
Pt given breakfast tray

## 2021-10-22 NOTE — ED Notes (Signed)
VS not taken, patient asleep 

## 2021-10-22 NOTE — ED Notes (Signed)
Pt was given an empty

## 2021-10-23 NOTE — ED Notes (Signed)
Pt given dinner tray and sprite.  

## 2021-10-23 NOTE — ED Notes (Signed)
Up to bathroom

## 2021-10-23 NOTE — ED Notes (Signed)
Pt. Given night snack.

## 2021-10-23 NOTE — ED Notes (Signed)
VOL  PENDING  PLACEMENT 

## 2021-10-23 NOTE — ED Notes (Signed)
Pt refusing to return the phone to RN.

## 2021-10-23 NOTE — ED Notes (Signed)
Pt speaking with his mother on the phone.

## 2021-10-23 NOTE — ED Notes (Signed)
Pt agitated.  Coming to nurse's station yelling, "Ship" and "worship."

## 2021-10-23 NOTE — TOC Progression Note (Signed)
Transition of Care [facility]) - Progression Note    Patient Details  Name: Adam [name] MRN: [id number] Date of Birth: 05-30-1961  [facility]) CM/SW Contact  [place], RN Phone Number: 10/23/2021, 3:15 PM  Clinical Narrative:    Call to Jackelyn Knife w/ [facility] [phone]: waiting on approval from upper management to submit application to [facility]. Looking for other possible funding sources to assist with placement.    Expected Discharge Plan: Group Home Barriers to Discharge: Continued Medical Work up  Expected Discharge Plan and Services Expected Discharge Plan: Group Home In-house Referral: Clinical Social Work     Living arrangements for the past 2 months: Group Home                                       Social Determinants of Health (SDOH) Interventions    Readmission Risk Interventions No flowsheet data found.

## 2021-10-23 NOTE — ED Notes (Signed)
Pt resting at this time. Will give breakfast tray when pt wakes.

## 2021-10-23 NOTE — ED Notes (Addendum)
Pt continuously knocking on door yelling. "No way no how!"

## 2021-10-23 NOTE — ED Notes (Signed)
VOL/pending placement 

## 2021-10-23 NOTE — ED Notes (Signed)
Pt given lunch tray and sprite  

## 2021-10-23 NOTE — ED Notes (Signed)
Pt calm, speaking with NP in dayroom.

## 2021-10-23 NOTE — ED Provider Notes (Signed)
Emergency Medicine Observation Re-evaluation Note  Adam [name] is a 60 y.o. male, seen on rounds today.  Pt initially presented to the ED for complaints of Psychiatric Evaluation Currently, the patient is calm, resting.  Physical Exam  BP (!) 139/103 (BP Location: Right Arm)   Pulse 74   Temp 97.6 F (36.4 C) (Oral)   Resp 14   Ht 5\' 8"  (1.727 m)   Wt 79.4 kg   SpO2 97%   BMI 26.61 kg/m    ED Course / MDM  EKG:EKG Interpretation  Date/Time:  Monday August 28 2021 20:12:52 EDT Ventricular Rate:  117 PR Interval:  162 QRS Duration: 104 QT Interval:  306 QTC Calculation: 426 R Axis:   -17 Text Interpretation: Sinus tachycardia Otherwise normal ECG Confirmed by UNCONFIRMED, DOCTOR (08-25-1991), editor [id number], Tammy [phone]) on 08/29/2021 8:10:23 AM  I have reviewed the labs performed to date as well as medications administered while in observation.  Recent changes in the last 24 hours include none.  Plan  Current plan is for SW disposition.  Adam [name] is not under involuntary commitment.     [name], MD 10/23/21 1425

## 2021-10-24 NOTE — ED Notes (Signed)
Vol /pending placement 

## 2021-10-24 NOTE — ED Provider Notes (Signed)
Emergency Medicine Observation Re-evaluation Note  Adam [name] is a 60 y.o. male, seen on rounds today.  Pt initially presented to the ED for complaints of Psychiatric Evaluation Currently, the patient is resting comfortably.  Physical Exam  BP 140/72 (BP Location: Left Arm)   Pulse 80   Temp 98.7 F (37.1 C) (Oral)   Resp 16   Ht 5\' 8"  (1.727 m)   Wt 79.4 kg   SpO2 94%   BMI 26.61 kg/m  Physical Exam Constitutional:      Appearance: He is not ill-appearing or toxic-appearing.  HENT:     Head: Atraumatic.  Cardiovascular:     Comments: Appears well perfused Pulmonary:     Effort: Pulmonary effort is normal.  Abdominal:     General: There is no distension.  Musculoskeletal:        General: No deformity.  Neurological:     General: No focal deficit present.     ED Course / MDM  EKG:EKG Interpretation  Date/Time:  Monday August 28 2021 20:12:52 EDT Ventricular Rate:  117 PR Interval:  162 QRS Duration: 104 QT Interval:  306 QTC Calculation: 426 R Axis:   -17 Text Interpretation: Sinus tachycardia Otherwise normal ECG Confirmed by UNCONFIRMED, DOCTOR (08-25-1991), editor [id number], Tammy [phone]) on 08/29/2021 8:10:23 AM  I have reviewed the labs performed to date as well as medications administered while in observation.  Recent changes in the last 24 hours include none.  Plan  Current plan is for placement.  Adam [name] is not under involuntary commitment.     [name], MD 10/24/21 [phone]

## 2021-10-24 NOTE — ED Notes (Signed)
Pt given snack tray and milk.

## 2021-10-24 NOTE — ED Notes (Signed)
Patient calm at this time. No behavioral issues to report at this time. Patient allowed ED tech to obtain vitals. Will continue to monitor.

## 2021-10-24 NOTE — ED Notes (Signed)
NP on unit to speak with pt in dayroom.

## 2021-10-25 NOTE — ED Notes (Signed)
Vol /pending placement 

## 2021-10-25 NOTE — ED Notes (Signed)
Pt given dinner tray and water. 

## 2021-10-25 NOTE — ED Notes (Signed)
Pt given the phone at this time.  

## 2021-10-25 NOTE — ED Provider Notes (Signed)
Emergency Medicine Observation Re-evaluation Note  Adam [name] is a 60 y.o. male, seen on rounds today.  Pt initially presented to the ED for complaints of Psychiatric Evaluation   Physical Exam  BP 130/89 (BP Location: Left Arm)   Pulse 87   Temp 98.3 F (36.8 C) (Oral)   Resp 16   Ht 5\' 8"  (1.727 m)   Wt 79.4 kg   SpO2 96%   BMI 26.61 kg/m  Physical Exam General: no acute distress Lungs: equal chest rise Psych: calm  ED Course / MDM  EKG:  I have reviewed the labs performed to date as well as medications administered while in observation.  Recent changes in the last 24 hours include none.  Plan  Current plan is for social work dispo.  Adam [name] is not under involuntary commitment.     [name], MD 10/25/21 [phone]

## 2021-10-26 NOTE — ED Notes (Signed)
Pt spit pills into his juice cup.  Cup taken from patient.

## 2021-10-26 NOTE — ED Notes (Signed)
Pt received snack. 

## 2021-10-26 NOTE — ED Notes (Signed)
Patient resting quietly in room. No noted distress or abnormal behaviors noted. Will continue 15 minute checks and observation by security camera for safety. 

## 2021-10-26 NOTE — ED Notes (Signed)
Pt was refusing snack will try later

## 2021-10-26 NOTE — ED Notes (Signed)
Pt comes to the nurses station door pointing at that the computer. We try redirecting his attention to other things, but failed. We removed the computer out of sight. He states screaming no and [place]

## 2021-10-26 NOTE — ED Notes (Signed)
Meal tray given 

## 2021-10-26 NOTE — TOC Progression Note (Signed)
Transition of Care [facility]) - Progression Note    Patient Details  Name: Jru Pense MRN: [id number] Date of Birth: [birth year]  Transition of Care [facility]) CM/SW Contact  Joseph Art, [place] Phone Number: 10/26/2021, 2:19 PM  Clinical Narrative:     CSW left voicemail for Jackelyn Knife w/ [facility] [phone], requesting information on placement. Currently waiting on approval from upper management to submit application to [facility]. Looking for other possible funding sources to assist with placement.   Expected Discharge Plan: Group Home Barriers to Discharge: Continued Medical Work up  Expected Discharge Plan and Services Expected Discharge Plan: Group Home In-house Referral: Clinical Social Work     Living arrangements for the past 2 months: Group Home                                       Social Determinants of Health (SDOH) Interventions    Readmission Risk Interventions No flowsheet data found.

## 2021-10-26 NOTE — ED Notes (Signed)
NP on unit to speak with patient.

## 2021-10-27 ENCOUNTER — Emergency Department: Payer: Medicare HMO

## 2021-10-27 LAB — URINALYSIS, COMPLETE (UACMP) WITH MICROSCOPIC
Bacteria, UA: NONE SEEN
Bilirubin Urine: NEGATIVE
Glucose, UA: NEGATIVE mg/dL
Hgb urine dipstick: NEGATIVE
Ketones, ur: NEGATIVE mg/dL
Leukocytes,Ua: NEGATIVE
Nitrite: NEGATIVE
Protein, ur: NEGATIVE mg/dL
Specific Gravity, Urine: 1.015 (ref 1.005–1.030)
pH: 7 (ref 5.0–8.0)

## 2021-10-27 NOTE — ED Provider Notes (Signed)
Urinalysis and ultrasound of the scrotum did not show any acute findings was only a trace bilateral hydrocele.   Arnaldo Natal, MD 10/27/21 2052

## 2021-10-27 NOTE — ED Notes (Signed)
VOLUNTARY/PENDING SW PLACEMENT/DISPO

## 2021-10-27 NOTE — ED Notes (Signed)
Pt up to restroom.

## 2021-10-27 NOTE — ED Notes (Signed)
Patient refused vsigns 

## 2021-10-27 NOTE — ED Notes (Signed)
Pt attempted to spit his meds into the cup of water. Pt eventually took all his meds after much prompting from staff. Pt tried to take out the haldol and depakote and was refusing them specifically. Did eventually take them after staff intervention.

## 2021-10-27 NOTE — ED Provider Notes (Signed)
I was notified by RN that patient had been complaining of some scrotal or penile pain in the last hour.  On my exam he is pointing to his penis but unable to further describe his discomfort.  He does not have any apparent rashes or overlying skin changes over his penile shaft or scrotum palpable inguinal hernia.  Testicles appear bilateral descended without significant induration erythema or point tenderness.  I will will obtain a ultrasound and UA.  Assess for possible infectious urethritis or epididymitis.   Gilles Chiquito, MD 10/27/21 [phone]

## 2021-10-27 NOTE — ED Provider Notes (Signed)
Emergency Medicine Observation Re-evaluation Note  Zeek Rostron is a 60 y.o. male, seen on rounds today.  Pt initially presented to the ED for complaints of Psychiatric Evaluation   Physical Exam  BP 109/76   Pulse (!) 110   Temp 97.8 F (36.6 C) (Oral)   Resp 18   Ht 5\' 8"  (1.727 m)   Wt 79.4 kg   SpO2 96%   BMI 26.61 kg/m  Physical Exam General: no acute distress Psych: calm  ED Course / MDM  EKG:EKG Interpretation  Date/Time:  Monday August 28 2021 20:12:52 EDT Ventricular Rate:  117 PR Interval:  162 QRS Duration: 104 QT Interval:  306 QTC Calculation: 426 R Axis:   -17 Text Interpretation: Sinus tachycardia Otherwise normal ECG Confirmed by UNCONFIRMED, DOCTOR (08-25-1991), editor [id number], Tammy [phone]) on 08/29/2021 8:10:23 AM  I have reviewed the labs performed to date as well as medications administered while in observation.  Recent changes in the last 24 hours include none.  Plan  Current plan is for social work dispo.  Albi Rappaport is not under involuntary commitment.     [name], MD 10/27/21 [phone]

## 2021-10-27 NOTE — ED Notes (Signed)
Pt given snack. 

## 2021-10-27 NOTE — ED Notes (Signed)
Pt refused vitals, will attempt again later

## 2021-10-27 NOTE — ED Notes (Signed)
VOL/pending placement 

## 2021-10-28 NOTE — ED Notes (Signed)
Report to amy, rn

## 2021-10-28 NOTE — ED Provider Notes (Signed)
Emergency Medicine Observation Re-evaluation Note  Adam [name] is a 60 y.o. male, seen on rounds today.  Pt initially presented to the ED for complaints of Psychiatric Evaluation Currently, the patient is resting comfortably.  Physical Exam  BP (!) 156/99   Pulse 65   Temp 98.8 F (37.1 C) (Oral)   Resp 14   Ht 5\' 8"  (1.727 m)   Wt 79.4 kg   SpO2 96%   BMI 26.61 kg/m  Physical Exam Constitutional:      Appearance: He is not ill-appearing or toxic-appearing.  HENT:     Head: Atraumatic.  Cardiovascular:     Comments: Appears well perfused Pulmonary:     Effort: Pulmonary effort is normal.  Abdominal:     General: There is no distension.  Musculoskeletal:        General: No deformity.  Neurological:     General: No focal deficit present.     ED Course / MDM  EKG:EKG Interpretation  Date/Time:  Monday August 28 2021 20:12:52 EDT Ventricular Rate:  117 PR Interval:  162 QRS Duration: 104 QT Interval:  306 QTC Calculation: 426 R Axis:   -17 Text Interpretation: Sinus tachycardia Otherwise normal ECG Confirmed by UNCONFIRMED, DOCTOR (08-25-1991), editor [id number], Tammy [phone]) on 08/29/2021 8:10:23 AM  I have reviewed the labs performed to date as well as medications administered while in observation.  Recent changes in the last 24 hours include scrotal and penile pain yesterday. UA and u/s unremarkable. Pain as since resolved.  Plan  Current plan is for placement.  Burrel Legrand is not under involuntary commitment.     [name], MD 10/28/21 [phone]

## 2021-10-28 NOTE — ED Notes (Signed)
Dinner tray given

## 2021-10-28 NOTE — ED Notes (Signed)
Pt given a vanilla ice cream

## 2021-10-28 NOTE — ED Notes (Signed)
Pt upset and trying to communicate with this Clinical research associate. "English  marriage decisions  Clerance Lav."   Pt has used the phone and did not wish for this Clinical research associate to dial any more numbers for him.

## 2021-10-28 NOTE — ED Notes (Signed)
Pt repeating the words "shoes" and "department."

## 2021-10-28 NOTE — ED Notes (Addendum)
Pt had spoken with his mother on the phone, but continues to want this Clinical research associate to dial a number.  Pt repeating what sounds like "Genie" and his name.  RN and NT unable to understand what patient needs at this time.

## 2021-10-28 NOTE — ED Notes (Signed)
Meal tray given 

## 2021-10-29 NOTE — ED Notes (Signed)
Pt received snack and drink 

## 2021-10-29 NOTE — ED Notes (Signed)
VOL/pending placement 

## 2021-10-30 NOTE — ED Notes (Signed)
Pt sitting in day room; calm, cooperative. Pt is oriented to name only. Pt states "It's funny; exciting, exciting, exciting" when asked how he feels. Pt denies pain and SI/HI/AVH at this time. Pt states "[place]. [place]. Nothing [place]" when asked if he slept well last night. Pt states "Yeah. No apartments" when asked if he is eating good. Pt denies depression at this time; however, he expresses feelings of anxiety, stating "Yeah" when asked. No acute distress noted.

## 2021-10-30 NOTE — ED Notes (Signed)
Gave lunch tray with water. 

## 2021-10-30 NOTE — ED Notes (Signed)
VOL/pending placement 

## 2021-10-30 NOTE — ED Notes (Signed)
Hourly rounding reveals patient in room. No complaints, stable, in no acute distress. Q15 minute rounds and monitoring via Security Cameras to continue. 

## 2021-10-30 NOTE — ED Notes (Signed)
Snack was given. 

## 2021-10-30 NOTE — ED Notes (Signed)
Pt in restroom 

## 2021-10-30 NOTE — ED Notes (Signed)
Report to include Situation, Background, Assessment, and Recommendations received from [facility]. Patient alert and oriented, warm and dry, in no acute distress. UTA  SI, HI, AVH and pain. Patient made aware of Q15 minute rounds and security cameras for their safety. Patient instructed to come to me with needs or concerns.

## 2021-10-30 NOTE — Progress Notes (Signed)
   10/30/21 1540  Clinical Encounter Type  Visited With Patient  Visit Type Follow-up;Spiritual support;Social support  Referral From Other (Comment) (rounding)  Spiritual Encounters  Spiritual Needs Other (Comment) (social support)  Chaplain Burris visited with Pt. Offered social support and calm, upbeat presence. Continuing to try incorporating some physical movement into our visit as well as creative engagement. Pt seems to welcome visits. Will continue to follow.

## 2021-10-30 NOTE — ED Notes (Signed)
Pt mother on phone to speak with Pt. Pt happy, laughing. Will continue to monitor.

## 2021-10-30 NOTE — ED Provider Notes (Signed)
Emergency Medicine Observation Re-evaluation Note  Adam [name] is a 60 y.o. male, seen on rounds today.  Pt initially presented to the ED for complaints of Psychiatric Evaluation Currently, the patient is speaking with chaplain.   Physical Exam  BP 123/78 (BP Location: Left Arm)   Pulse 80   Temp 97.9 F (36.6 C) (Oral)   Resp 17   Ht 5\' 8"  (1.727 m)   Wt 79.4 kg   SpO2 95%   BMI 26.61 kg/m  Physical Exam General: no distress Lungs: no resp distress Psych: calm and cooperative   ED Course / MDM   I have reviewed the labs performed to date as well as medications administered while in observation.  Recent changes in the last 24 hours include no changes.   Plan  Current plan is for SW placement  Adam [name] is not under involuntary commitment.     [name], MD 10/30/21 [phone]

## 2021-10-30 NOTE — ED Notes (Signed)
VOLUNTARY/PENDING PLACEMENT

## 2021-10-31 NOTE — ED Notes (Signed)
Report to include Situation, Background, Assessment, and Recommendations received from [facility]. Patient alert and oriented, warm and dry, in no acute distress. UTA SI, HI, AVH and pain. Patient made aware of Q15 minute rounds and security cameras for their safety. Patient instructed to come to me with needs or concerns.

## 2021-10-31 NOTE — ED Notes (Signed)
Hourly rounding reveals patient in room. No complaints, stable, in no acute distress. Q15 minute rounds and monitoring via Security Cameras to continue. 

## 2021-10-31 NOTE — ED Notes (Signed)
Declined snack. 

## 2021-10-31 NOTE — ED Notes (Signed)
Hospital meal provided.  100% consumed, pt tolerated w/o complaints.  Waste discarded appropriately.   

## 2021-10-31 NOTE — ED Notes (Signed)
Offered shower, declined. 

## 2021-10-31 NOTE — ED Notes (Signed)
Used phone to call [place], no answer.

## 2021-10-31 NOTE — ED Notes (Addendum)
Pt requesting shower by jerking on shower door handle despite refusing earlier today. Pt was given hygiene items as well as:  I clean top, 1 clean bottom, with 1 pair of disposable underwear.  Pt changed out into clean clothing.  Staff disposed of all shower supplies.

## 2021-10-31 NOTE — ED Notes (Signed)
VOL/Pending Placement 

## 2021-10-31 NOTE — ED Notes (Signed)
Patient provided snack at appropriate snack time.  Pt consumed 100% of snack provided, tolerated well w/o complaints   Trash disposted of appropriately by patient.  Encouraged patient to tidy room, provided trash can for patient to throw away any trash in patient room with staff supervision.

## 2021-10-31 NOTE — ED Notes (Signed)
Snack and beverage given. 

## 2021-10-31 NOTE — ED Notes (Signed)
VS not taken, patient asleep 

## 2021-10-31 NOTE — ED Provider Notes (Addendum)
Emergency Medicine Observation Re-evaluation Note  Adam [name] is a 60 y.o. male, seen on rounds today.  Pt initially presented to the ED for complaints of Psychiatric Evaluation   Physical Exam  BP 120/87 (BP Location: Left Arm)   Pulse (!) 109   Temp 99.1 F (37.3 C) (Oral)   Resp 18   Ht 5\' 8"  (1.727 m)   Wt 79.4 kg   SpO2 97%   BMI 26.61 kg/m  Physical Exam General: nad Cardiac: well perfused Lungs: unlabored Psych: currently calm  ED Course / MDM  EKG:EKG Interpretation  Date/Time:  Monday August 28 2021 20:12:52 EDT Ventricular Rate:  117 PR Interval:  162 QRS Duration: 104 QT Interval:  306 QTC Calculation: 426 R Axis:   -17 Text Interpretation: Sinus tachycardia Otherwise normal ECG Confirmed by UNCONFIRMED, DOCTOR (08-25-1991), editor [id number], Tammy [phone]) on 08/29/2021 8:10:23 AM  I have reviewed the labs performed to date as well as medications administered while in observation.  Recent changes in the last 24 hours include none.  Plan  Current plan is for sw placement  Randen Kauth is not under involuntary commitment.     [name], MD 10/31/21 11/02/21    [id number], MD 10/31/21 [phone]

## 2021-10-31 NOTE — ED Notes (Signed)
Walking around dayroom yelling.

## 2021-11-01 ENCOUNTER — Encounter: Payer: Self-pay | Admitting: *Deleted

## 2021-11-01 MED ORDER — DIPHENHYDRAMINE HCL 25 MG PO CAPS
ORAL_CAPSULE | ORAL | Status: AC
  Administered 2021-11-01: 25 mg via ORAL
  Filled 2021-11-01: qty 1

## 2021-11-01 MED ORDER — DIPHENHYDRAMINE HCL 25 MG PO CAPS: 25.0000 mg | ORAL_CAPSULE | Freq: Once | ORAL | Status: AC

## 2021-11-01 NOTE — ED Notes (Addendum)
Pt kept asking for more lotion. RN witnessed pt eating the lotion. Pt was redirected to not eat the lotion. Pt was given icecream for redirection.

## 2021-11-01 NOTE — ED Notes (Signed)
Pt complaint of itching of his arms. Will ask MD for meds.

## 2021-11-01 NOTE — ED Provider Notes (Signed)
Emergency Medicine Observation Re-evaluation Note  Woodson Macha is a 60 y.o. male, seen on rounds today.  Pt initially presented to the ED for complaints of Psychiatric Evaluation Currently, the patient is resting in the dayroom and has no complaints.  Physical Exam  BP (!) 155/110 (BP Location: Left Arm)   Pulse 80   Temp 98.3 F (36.8 C)   Resp 18   Ht 5\' 8"  (1.727 m)   Wt 79.4 kg   SpO2 98%   BMI 26.61 kg/m  Physical Exam General: Comfortable appearing. Cardiac: Normal peripheral perfusion. Lungs: Normal respiratory effort. Psych: Calm and cooperative.  ED Course / MDM    I have reviewed the labs performed to date as well as medications administered while in observation.  There have been no changes to his status in the last 24 hours.  Plan  Current plan is for social work placement.  Kamaal Cast is not under involuntary commitment.     [name], MD 11/01/21 1222

## 2021-11-01 NOTE — TOC Progression Note (Signed)
Transition of Care [facility]) - Progression Note    Patient Details  Name: Hamad Whyte MRN: [id number] Date of Birth: 10-21-1961  [facility] of [facility]) CM/SW Contact  Joseph Art, [place] Phone Number: 11/01/2021, 6:25 PM  Clinical Narrative:     CSW left voicemail for Jackelyn Knife w/ [facility] [phone], requesting information on placement. Currently waiting on approval from upper management to submit application to [facility]. Looking for other possible funding sources to assist with placement.    Expected Discharge Plan: Group Home Barriers to Discharge: Continued Medical Work up  Expected Discharge Plan and Services Expected Discharge Plan: Group Home In-house Referral: Clinical Social Work     Living arrangements for the past 2 months: Group Home                                       Social Determinants of Health (SDOH) Interventions    Readmission Risk Interventions No flowsheet data found.

## 2021-11-01 NOTE — ED Notes (Signed)
Hourly rounding reveals patient in room. No complaints, stable, in no acute distress. Q15 minute rounds and monitoring via Security Cameras to continue. 

## 2021-11-01 NOTE — ED Notes (Signed)
VS not taken, patient asleep 

## 2021-11-01 NOTE — ED Notes (Signed)
Pt unable to provide adequate PMH secondary to communication deficits.

## 2021-11-01 NOTE — ED Notes (Signed)
Pt given lunch tray and a cup of sprite.  

## 2021-11-01 NOTE — ED Notes (Signed)
Pt given meal tray and a cup of sprite.  

## 2021-11-01 NOTE — ED Notes (Signed)
Pt given supplies and clean clothes to shower.

## 2021-11-01 NOTE — ED Notes (Signed)
Pt given breakfast tray and a cup of juice.

## 2021-11-02 NOTE — ED Notes (Addendum)
Pt agitated with staff for not understanding what he wants.  Pt brought all of his bedding up to nurse's station, but refused clean sheets.   RN has called chaplain to come visit with patient and decrease agitation.

## 2021-11-02 NOTE — ED Notes (Signed)
lunch tray given. 

## 2021-11-02 NOTE — ED Notes (Signed)
Report to include Situation, Background, Assessment, and Recommendations received from Amy RN. Patient alert and oriented, warm and dry, in no acute distress. UTA SI, HI, AVH and pain. Patient made aware of Q15 minute rounds and security cameras for their safety. Patient instructed to come to me with needs or concerns.

## 2021-11-02 NOTE — ED Notes (Signed)
Pt refusing VS

## 2021-11-02 NOTE — ED Notes (Signed)
Hourly rounding reveals patient in room. No complaints, stable, in no acute distress. Q15 minute rounds and monitoring via Security Cameras to continue. 

## 2021-11-02 NOTE — ED Notes (Signed)
Pt refused to vs to be taken and refused dinner tray at this moment.

## 2021-11-02 NOTE — ED Notes (Signed)
Pt to nurse's station yelling "PHONE CALL!"  Pt told he could use the phone at 1 pm.  Pt continues to knock at door and ask for phone.  Difficult to re-direct.

## 2021-11-02 NOTE — Progress Notes (Signed)
   11/02/21 1245  Clinical Encounter Type  Visited With Patient  Visit Type Follow-up;Spiritual support;Social support  Referral From Nurse  Consult/Referral To Chaplain  Spiritual Encounters  Spiritual Needs Other (Comment) (social support)  Chaplain Burris responded to request to provide support for Pt who has been agitated this morning. Chaplain Burris initially did some active listening and invited Adam [name] to draw in order to ascertain if there was a specific need or concern that he was attempting to communicate. Chaplain Burris next offered music as an intervention. Pt mentioned band, [employer], and chaplain utilized her phone to play music for an extended time. Pt demonstrated immediate relief observable in his demeanor and facial expression. After this time of music, the chaplain offered a small blessing and gratitude for the time of sharing. This visit seemed very welcome. One source for the earlier agitation may be related to another patient because when Adam [name] heard a nearby male patient's voice the negative reaction on his face was quite apparent.

## 2021-11-02 NOTE — ED Notes (Signed)
Pt took his medications on 2nd attempt by RN.  Pt appeared to swallow pills, no pills seen in juice cup.

## 2021-11-02 NOTE — ED Notes (Signed)
Snack and beverage given. 

## 2021-11-02 NOTE — NC FL2 (Signed)
[place] MEDICAID FL2 LEVEL OF CARE SCREENING TOOL     IDENTIFICATION  Patient Name: Adam [name] Birthdate: November 15, 1961 Sex: male Admission Date (Current Location): 07/20/2021  [place] and [place] Number:  Chiropodist and Address:  [facility], [address], [place], [place] [address]      Provider Number: [id number]  Attending Physician Name and Address:  No att. 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) 07/25/2021   Schizophrenia, chronic condition with acute exacerbation (HCC) 07/20/2021    Orientation RESPIRATION BLADDER Height & Weight     Self, Situation, Place  Normal Continent Weight: 175 lb (79.4 kg) Height:  5\' 8"  (172.7 cm)  BEHAVIORAL SYMPTOMS/MOOD NEUROLOGICAL BOWEL NUTRITION STATUS  Wanderer   Continent Diet  AMBULATORY STATUS COMMUNICATION OF NEEDS Skin   Independent Verbally Normal                       Personal Care Assistance Level of Assistance  Bathing, Feeding, Dressing, Total care Bathing Assistance: Independent Feeding assistance: Independent Dressing Assistance: Independent Total Care Assistance: Limited assistance   Functional Limitations Info  Sight, Hearing, Speech Sight Info: Adequate Hearing Info: Adequate Speech Info: Adequate    SPECIAL CARE FACTORS FREQUENCY                       Contractures Contractures Info: Present    Additional Factors Info  Psychotropic               Current Medications (11/02/2021):  This is the current hospital active medication list Current  Facility-Administered Medications  Medication Dose Route Frequency Provider Last Rate Last Admin   acetaminophen (TYLENOL) tablet 1,000 mg  1,000 mg Oral Q6H PRN Ward, Kristen N, DO   1,000 mg at 10/27/21 1429   benztropine (COGENTIN) tablet 0.5 mg  0.5 mg Oral BID 10/29/21, NP   0.5 mg at 11/02/21 1104   cholecalciferol (VITAMIN D3) tablet 1,000 Units  1,000 Units Oral Daily 14/01/22, NP   1,000 Units at 11/02/21 1104   diphenhydrAMINE (BENADRYL) injection 50 mg  50 mg Intramuscular Q6H PRN [name], [name], MD   50 mg at 10/20/21 2310   divalproex (DEPAKOTE) DR tablet 500 mg  500 mg Oral Q12H 10/22/21, NP   500 mg at 11/02/21 1103   famotidine (PEPCID) tablet 20 mg  20 mg Oral BID PRN 14/01/22, NP   20 mg at 10/11/21 1114   folic acid (FOLVITE) tablet 1 mg  1 mg Oral Daily 13/09/22, NP   1 mg at 11/01/21 1507   gabapentin (NEURONTIN) tablet 300 mg  300 mg Oral TID 11/03/21, RPH   300 mg at 11/02/21 1104   haloperidol (HALDOL) tablet 7 mg  7 mg Oral TID 14/01/22, NP   7 mg at 11/02/21 1104   Or   haloperidol lactate (HALDOL) injection 5 mg  5 mg Intramuscular TID 14/01/22, NP   5 mg at 09/26/21 2111  haloperidol lactate (HALDOL) injection 5 mg  5 mg Intramuscular Q6H PRN [name], [name], MD   5 mg at 10/20/21 2310   lisinopril (ZESTRIL) tablet 5 mg  5 mg Oral Daily [name], [name], MD   5 mg at 11/01/21 1507   LORazepam (ATIVAN) tablet 1 mg  1 mg Oral TID Charm Rings, NP   1 mg at 11/02/21 1103   multivitamin with minerals tablet 1 tablet  1 tablet Oral Daily Charm Rings, NP   1 tablet at 11/02/21 1104   paliperidone (INVEGA) 24 hr tablet 6 mg  6 mg Oral Q1400 Charm Rings, NP   6 mg at 11/01/21 1507   temazepam (RESTORIL) capsule 15 mg  15 mg Oral QHS Otelia Sergeant, RPH   15 mg at 11/01/21 2204   thiamine tablet 100 mg  100 mg Oral Daily Charm Rings, NP   100 mg at 11/02/21 1103   ziprasidone (GEODON) injection 20 mg  20  mg Intramuscular Q12H PRN [name], [name], MD   20 mg at 10/19/21 1140   Current Outpatient Medications  Medication Sig Dispense Refill   benztropine (COGENTIN) 1 MG tablet Take 1 mg by mouth daily.     cholecalciferol (VITAMIN D3) 25 MCG (1000 UNIT) tablet Take 1,000 Units by mouth daily.     famotidine (PEPCID) 20 MG tablet Take 20 mg by mouth 2 (two) times daily as needed for heartburn or indigestion.     folic acid (FOLVITE) 1 MG tablet Take 1 mg by mouth daily.     gabapentin (NEURONTIN) 300 MG capsule Take 300 mg by mouth 3 (three) times daily.     haloperidol (HALDOL) 5 MG tablet Take 5 mg by mouth 3 (three) times daily.     LORazepam (ATIVAN) 1 MG tablet Take 1 mg by mouth 3 (three) times daily.     Multiple Vitamin (MULTIVITAMIN) tablet Take 1 tablet by mouth daily.     paliperidone (INVEGA SUSTENNA) 234 MG/1.5ML SUSY injection Inject 234 mg into the muscle every 28 (twenty-eight) days.     thiamine (VITAMIN B-1) 100 MG tablet Take 100 mg by mouth daily.     traZODone (DESYREL) 150 MG tablet Take 150 mg by mouth at bedtime.       Discharge Medications: Please see discharge summary for a list of discharge medications.  Relevant Imaging Results:  Relevant Lab Results:   Additional Information SS# [id number]  Joseph Art, LCSWA

## 2021-11-02 NOTE — ED Notes (Signed)
Chaplain on unit to speak with patient.

## 2021-11-02 NOTE — ED Provider Notes (Signed)
Emergency Medicine Observation Re-evaluation Note  Adam [name] is a 60 y.o. male, seen on rounds today.  Pt initially presented to the ED for complaints of Psychiatric Evaluation   Physical Exam  BP 103/81   Pulse 74   Temp 98.4 F (36.9 C) (Oral)   Resp 18   Ht 5\' 8"  (1.727 m)   Wt 79.4 kg   SpO2 97%   BMI 26.61 kg/m  Physical Exam General: nad Cardiac: well perfused Lungs: unlabored Psych: curretly calm  ED Course / MDM  EKG:EKG Interpretation  Date/Time:  Monday August 28 2021 20:12:52 EDT Ventricular Rate:  117 PR Interval:  162 QRS Duration: 104 QT Interval:  306 QTC Calculation: 426 R Axis:   -17 Text Interpretation: Sinus tachycardia Otherwise normal ECG Confirmed by UNCONFIRMED, DOCTOR (08-25-1991), editor [id number], Tammy [phone]) on 08/29/2021 8:10:23 AM  I have reviewed the labs performed to date as well as medications administered while in observation.  Recent changes in the last 24 hours include none.  Plan  Current plan is for placement.  Adam [name] is not under involuntary commitment.     [name], MD 11/02/21 [phone]

## 2021-11-02 NOTE — TOC Progression Note (Addendum)
Transition of Care [facility]) - Progression Note    Patient Details  Name: Adam [name] MRN: [id number] Date of Birth: 01/03/1961  Transition of Care [facility]) CM/SW Contact  [name] Phone Number: [phone] 11/02/2021, 2:21 PM  Clinical Narrative:     FL2 revised and emailed, per request from Jackelyn Knife w/ [facility] [phone].  [name] stated he is attending meeting with leadership at [facility] and they will discuss funding possibilities for placement.  [name] stated he would update this CSW.   Expected Discharge Plan: Group Home Barriers to Discharge: Continued Medical Work up  Expected Discharge Plan and Services Expected Discharge Plan: Group Home In-house Referral: Clinical Social Work     Living arrangements for the past 2 months: Group Home                                       Social Determinants of Health (SDOH) Interventions    Readmission Risk Interventions No flowsheet data found.

## 2021-11-02 NOTE — ED Notes (Signed)
Pt yelling and banging on door and window. Unable to be re-directed.  NP in agreement with giving PRN Geodon.   Pt took medication willingly with security present.

## 2021-11-03 NOTE — ED Notes (Signed)
Hourly rounding reveals patient in room. No complaints, stable, in no acute distress. Q15 minute rounds and monitoring via Security Cameras to continue. 

## 2021-11-03 NOTE — ED Notes (Signed)
VS not taken, patient asleep 

## 2021-11-03 NOTE — ED Notes (Signed)
Pt yelling and agitated after watching another patient discharge from the unit.  Charge RN on unit to help de-escalate patient.  IM medication drawn, but will be held.  Pt returned to his room.

## 2021-11-03 NOTE — ED Notes (Signed)
Pt requested Tylenol.  RN asked where his pain was and pt lifted up his shirt.

## 2021-11-03 NOTE — ED Notes (Signed)
Report to include Situation, Background, Assessment, and Recommendations received from Amy RN. Patient alert and oriented, warm and dry, in no acute distress. UTA SI, HI, AVH and pain. Patient made aware of Q15 minute rounds and security cameras for their safety. Patient instructed to come to me with needs or concerns.

## 2021-11-03 NOTE — ED Notes (Signed)
Snack and beverage given. 

## 2021-11-03 NOTE — ED Provider Notes (Signed)
Emergency Medicine Observation Re-evaluation Note  Suhan Paci is a 60 y.o. male, seen on rounds today.  Pt initially presented to the ED for complaints of Psychiatric Evaluation Currently, the patient is resting comfortably.  Physical Exam  BP (!) 153/111 (BP Location: Left Arm)   Pulse 77   Temp 97.8 F (36.6 C) (Oral)   Resp 16   Ht 5\' 8"  (1.727 m)   Wt 79.4 kg   SpO2 100%   BMI 26.61 kg/m  Physical Exam Constitutional:      Appearance: He is not ill-appearing or toxic-appearing.  HENT:     Head: Atraumatic.  Cardiovascular:     Comments: Appears well perfused Pulmonary:     Effort: Pulmonary effort is normal.  Abdominal:     General: There is no distension.  Musculoskeletal:        General: No deformity.  Neurological:     General: No focal deficit present.     ED Course / MDM  EKG:EKG Interpretation  Date/Time:  Monday August 28 2021 20:12:52 EDT Ventricular Rate:  117 PR Interval:  162 QRS Duration: 104 QT Interval:  306 QTC Calculation: 426 R Axis:   -17 Text Interpretation: Sinus tachycardia Otherwise normal ECG Confirmed by UNCONFIRMED, DOCTOR (08-25-1991), editor [id number], Tammy [phone]) on 08/29/2021 8:10:23 AM  I have reviewed the labs performed to date as well as medications administered while in observation.  Recent changes in the last 24 hours include none.  Plan  Current plan is for placement.  Kentaro Alewine is not under involuntary commitment.     [name], MD 11/03/21 [phone]

## 2021-11-04 NOTE — ED Notes (Signed)
Pt provided snack by NT

## 2021-11-04 NOTE — ED Provider Notes (Signed)
Emergency Medicine Observation Re-evaluation Note  Adam [name] is a 60 y.o. male, seen for psychiatric evaluation.  No new/acute events overnight.  Physical Exam  BP (!) 149/100 (BP Location: Left Arm)   Pulse 90   Temp (!) 97.4 F (36.3 C) (Oral)   Resp 18   Ht 5\' 8"  (1.727 m)   Wt 79.4 kg   SpO2 100%   BMI 26.61 kg/m    ED Course / MDM   No recent lab work for review  Plan  Current plan is for placement into an appropriate living facility.  Adam [name] is not under involuntary commitment.     [name], MD 11/04/21 1454

## 2021-11-04 NOTE — ED Notes (Signed)
Snack and beverage given. 

## 2021-11-04 NOTE — ED Notes (Signed)
Hourly rounding reveals patient in room. No complaints, stable, in no acute distress. Q15 minute rounds and monitoring via Security Cameras to continue. 

## 2021-11-04 NOTE — ED Notes (Signed)
Pt angry after phone was given to another patient.  Pt yelling "MACHINE" and banging on window.

## 2021-11-04 NOTE — ED Notes (Signed)
Report to include Situation, Background, Assessment, and Recommendations received from Amy RN. Patient alert and oriented, warm and dry, in no acute distress. UTA SI, HI, AVH and pain. Patient made aware of Q15 minute rounds and security cameras for their safety. Patient instructed to come to me with needs or concerns.

## 2021-11-04 NOTE — ED Notes (Signed)
Pt and medications scanned.  Due to computer shutting down, administration was  "incomplete" in Epic.  RN marked medications as given.

## 2021-11-04 NOTE — ED Notes (Signed)
VS not taken, patient asleep 

## 2021-11-04 NOTE — ED Notes (Signed)
Pt received lunch and drink.  Pt ate a few bites. :Lunch tray and drink remain in room.

## 2021-11-05 NOTE — ED Provider Notes (Signed)
Emergency Medicine Observation Re-evaluation Note  Adam [name] is a 60 y.o. male, seen on rounds today.  Pt initially presented to the ED for complaints of Psychiatric Evaluation  Currently, the patient is resting in bed- no events overnight.   Physical Exam  Blood pressure 123/81, pulse 86, temperature 97.9 F (36.6 C), temperature source Oral, resp. rate 18, height 5\' 8"  (1.727 m), weight 79.4 kg, SpO2 96 %.  Physical Exam General: No apparent distress, asleep in bed  Pulm: Normal WOB      ED Course / MDM     Recent changes in the last 24 hours include none  Plan   Current plan is to continue to wait for SW placement Patient is not under full IVC at this time.   , MD 11/05/21 0730

## 2021-11-05 NOTE — ED Notes (Signed)
VS not taken, patient asleep 

## 2021-11-05 NOTE — ED Notes (Signed)
Report to include Situation, Background, Assessment, and Recommendations received from [facility]. Patient alert and oriented, warm and dry, in no acute distress. UTA SI, HI, AVH and pain. Patient made aware of Q15 minute rounds and security cameras for their safety. Patient instructed to come to me with needs or concerns.

## 2021-11-05 NOTE — ED Notes (Signed)
Snack and beverage given. 

## 2021-11-05 NOTE — ED Notes (Signed)
Hospital meal provided, pt tolerated w/o complaints.  Waste discarded appropriately.  

## 2021-11-05 NOTE — ED Notes (Signed)
Pt seen during initial RN shift change rounding, pt sleeping w/o s/s of distress.  Cont to monitor as ordered

## 2021-11-05 NOTE — ED Notes (Signed)
VOL/Pending CSW Placement °

## 2021-11-06 NOTE — ED Notes (Signed)
Patient threw his lunch in trash at this time.

## 2021-11-06 NOTE — ED Notes (Signed)
VS not taken, patient asleep 

## 2021-11-06 NOTE — ED Notes (Signed)
Vol pending placement toc

## 2021-11-06 NOTE — ED Notes (Signed)
VOL / pending TOC placement 

## 2021-11-06 NOTE — ED Notes (Signed)
Report to include Situation, Background, Assessment, and Recommendations received from [facility]. Patient alert and oriented, warm and dry, in no acute distress. UTA SI, HI, AVH and pain. Patient made aware of Q15 minute rounds and security cameras for their safety. Patient instructed to come to me with needs or concerns.

## 2021-11-06 NOTE — ED Notes (Signed)
Patient refused v-signs again

## 2021-11-06 NOTE — ED Notes (Signed)
Patient refused v-signs at this time,

## 2021-11-06 NOTE — ED Notes (Signed)
Pt given snack. 

## 2021-11-07 NOTE — ED Notes (Signed)
Pt given snack. 

## 2021-11-07 NOTE — ED Notes (Signed)
Pt behavior starting to escalate, wanting an armband with the name Adam [name].  Pt given "armband" with the name Adam [name] written on it.  Pt is now calm.

## 2021-11-07 NOTE — ED Notes (Signed)
Pt fixated on being called Gunnar Fusi.  Pt is at window repeating "Nigil, Braman."

## 2021-11-07 NOTE — ED Notes (Signed)
Vol /pending placement 

## 2021-11-07 NOTE — ED Notes (Signed)
Pt given new armband.

## 2021-11-07 NOTE — ED Notes (Signed)
Pt allowed to use phone to call his mother.

## 2021-11-07 NOTE — TOC Progression Note (Signed)
Transition of Care [facility]) - Progression Note    Patient Details  Name: Adam [name] MRN: [id number] Date of Birth: [birth year]  [facility]) CM/SW Contact  Allayne Butcher, RN Phone Number: 11/07/2021, 3:42 PM  Clinical Narrative:    [facility] Case Manager, [name], is setting up a meeting for tomorrow including himself, TOC, and the patient's mother for 10 am via [employer].    Expected Discharge Plan: Group Home Barriers to Discharge: Continued Medical Work up  Expected Discharge Plan and Services Expected Discharge Plan: Group Home In-house Referral: Clinical Social Work     Living arrangements for the past 2 months: Group Home                                       Social Determinants of Health (SDOH) Interventions    Readmission Risk Interventions No flowsheet data found.

## 2021-11-07 NOTE — ED Notes (Signed)
Resumed care from Jaleesa Cervi b rn.  Pt in dayroom sitting in a chair.  Pt calm at this moment.

## 2021-11-07 NOTE — ED Notes (Signed)
VS not taken, patient asleep 

## 2021-11-07 NOTE — ED Notes (Signed)
Pt given the phone to talk to his mother.

## 2021-11-07 NOTE — ED Notes (Signed)
Pt refused snack. Pt given milk.

## 2021-11-07 NOTE — ED Provider Notes (Signed)
Emergency Medicine Observation Re-evaluation Note  Adam [name] is a 60 y.o. male, seen on rounds today.  Pt initially presented to the ED for complaints of Psychiatric Evaluation Currently, the patient is resting comfortably.   Physical Exam  BP (!) 138/91   Pulse 100   Temp 98.6 F (37 C) (Oral)   Resp 18   Ht 5\' 8"  (1.727 m)   Wt 79.4 kg   SpO2 97%   BMI 26.61 kg/m  Physical Exam General: no acute distress Lungs: no resp distress Psych: calm and cooperative  ED Course / MDM  EKG:EKG Interpretation  Date/Time:  Monday August 28 2021 20:12:52 EDT Ventricular Rate:  117 PR Interval:  162 QRS Duration: 104 QT Interval:  306 QTC Calculation: 426 R Axis:   -17 Text Interpretation: Sinus tachycardia Otherwise normal ECG Confirmed by UNCONFIRMED, DOCTOR (08-25-1991), editor [id number], Tammy [phone]) on 08/29/2021 8:10:23 AM  I have reviewed the labs performed to date as well as medications administered while in observation.  Recent changes in the last 24 hours include none.  Plan  Current plan is for SW placement.  Tori Cupps is not under involuntary commitment.     [name], MD 11/07/21 [phone]

## 2021-11-08 MED ORDER — GABAPENTIN 300 MG PO CAPS
300.0000 mg | ORAL_CAPSULE | Freq: Three times a day (TID) | ORAL | Status: DC
  Administered 2021-11-08 – 2022-02-22 (×297): 300 mg via ORAL
  Filled 2021-11-08 (×9): qty 1
  Filled 2021-11-08: qty 3
  Filled 2021-11-08 (×3): qty 1
  Filled 2021-11-08 (×2): qty 3
  Filled 2021-11-08: qty 1
  Filled 2021-11-08: qty 3
  Filled 2021-11-08 (×19): qty 1
  Filled 2021-11-08: qty 3
  Filled 2021-11-08 (×11): qty 1
  Filled 2021-11-08: qty 3
  Filled 2021-11-08 (×11): qty 1
  Filled 2021-11-08: qty 3
  Filled 2021-11-08: qty 1
  Filled 2021-11-08: qty 3
  Filled 2021-11-08 (×10): qty 1
  Filled 2021-11-08: qty 3
  Filled 2021-11-08 (×15): qty 1
  Filled 2021-11-08: qty 3
  Filled 2021-11-08 (×11): qty 1
  Filled 2021-11-08: qty 3
  Filled 2021-11-08 (×26): qty 1
  Filled 2021-11-08: qty 3
  Filled 2021-11-08 (×4): qty 1
  Filled 2021-11-08: qty 3
  Filled 2021-11-08 (×35): qty 1
  Filled 2021-11-08: qty 3
  Filled 2021-11-08 (×4): qty 1
  Filled 2021-11-08: qty 3
  Filled 2021-11-08 (×4): qty 1
  Filled 2021-11-08: qty 3
  Filled 2021-11-08 (×12): qty 1
  Filled 2021-11-08: qty 3
  Filled 2021-11-08 (×7): qty 1
  Filled 2021-11-08: qty 3
  Filled 2021-11-08 (×17): qty 1
  Filled 2021-11-08 (×2): qty 3
  Filled 2021-11-08 (×2): qty 1
  Filled 2021-11-08: qty 3
  Filled 2021-11-08 (×7): qty 1
  Filled 2021-11-08: qty 3
  Filled 2021-11-08 (×9): qty 1
  Filled 2021-11-08: qty 3
  Filled 2021-11-08 (×7): qty 1
  Filled 2021-11-08 (×2): qty 3
  Filled 2021-11-08 (×10): qty 1
  Filled 2021-11-08 (×2): qty 3
  Filled 2021-11-08 (×8): qty 1
  Filled 2021-11-08 (×2): qty 3
  Filled 2021-11-08 (×13): qty 1
  Filled 2021-11-08: qty 3
  Filled 2021-11-08 (×8): qty 1
  Filled 2021-11-08: qty 3
  Filled 2021-11-08 (×4): qty 1
  Filled 2021-11-08: qty 3
  Filled 2021-11-08 (×13): qty 1

## 2021-11-08 NOTE — ED Provider Notes (Signed)
Emergency Medicine Observation Re-evaluation Note  Adam [name] is a 60 y.o. male, seen on rounds today.  Pt initially presented to the ED for complaints of Psychiatric Evaluation Currently, the patient is sleeping.   Physical Exam  BP 138/89 (BP Location: Left Arm)   Pulse 85   Temp 97.9 F (36.6 C) (Oral)   Resp 18   Ht 5\' 8"  (1.727 m)   Wt 79.4 kg   SpO2 100%   BMI 26.61 kg/m  Physical Exam General: resting comfortably Lungs: no resp distress Psych: no agitation  ED Course / MDM    I have reviewed the labs performed to date as well as medications administered while in observation.  Recent changes in the last 24 hours include no changes.  Plan  Current plan is for Sw placement.  Adam [name] is not under involuntary commitment.     [name], MD 11/08/21 [phone]

## 2021-11-08 NOTE — ED Notes (Signed)
VOL, pend TOC placement

## 2021-11-08 NOTE — ED Notes (Signed)
VOL/Pending Placement/ Meeting for today await outcome

## 2021-11-08 NOTE — ED Notes (Signed)
Patient provided snack at appropriate snack time.  Pt consumed 100% of snack provided, tolerated well w/o complaints   Trash disposted of appropriately by patient.  

## 2021-11-08 NOTE — ED Notes (Signed)
Pt seen during initial RN shift change rounding, pt sleeping w/o s/s of distress.  Cont to monitor as ordered

## 2021-11-08 NOTE — ED Notes (Signed)
Pt w/ 1 RN & 1 security went into family room to interview with group home.

## 2021-11-08 NOTE — ED Notes (Signed)
Meal given to pt.

## 2021-11-08 NOTE — ED Notes (Addendum)
Gave patient nutri grain bar and milk for his snack.

## 2021-11-08 NOTE — ED Notes (Signed)
Pt. Got night snacks and a drink.

## 2021-11-08 NOTE — ED Notes (Signed)
Unlocked bathroom door to allow patient to use restroom. Pt exited and bathroom door locked behind patient by staff.  

## 2021-11-08 NOTE — ED Notes (Addendum)
Per pt's request pt received water

## 2021-11-08 NOTE — ED Notes (Signed)
Shaquon went to the restroom.

## 2021-11-08 NOTE — ED Notes (Signed)
Hospital meal provided, pt tolerated w/o complaints.  Waste discarded appropriately.  

## 2021-11-08 NOTE — ED Notes (Signed)
Report off to [facility] r[facility]

## 2021-11-08 NOTE — ED Notes (Signed)
Gave food tray with water. °

## 2021-11-08 NOTE — ED Notes (Signed)
Pt walking around in dayroom  pt calm

## 2021-11-08 NOTE — TOC Progression Note (Signed)
Transition of Care [facility]) - Progression Note    Patient Details  Name: Adam [name] MRN: [id number] Date of Birth: Oct 19, 1961  Transition of Care [facility]) CM/SW Contact  Allayne Butcher, RN Phone Number: 11/08/2021, 3:03 PM  Clinical Narrative:    Meeting with [facility] case manager and group home coordinator was promising.  Hope to hear something in the near future on if they can accept patient.  Patient was present during the video call and appeared to be excited about the possibility for placement outside if this hospital.      Expected Discharge Plan: Group Home Barriers to Discharge: Continued Medical Work up  Expected Discharge Plan and Services Expected Discharge Plan: Group Home In-house Referral: Clinical Social Work     Living arrangements for the past 2 months: Group Home                                       Social Determinants of Health (SDOH) Interventions    Readmission Risk Interventions No flowsheet data found.

## 2021-11-08 NOTE — ED Notes (Signed)
Pt returned to unit.

## 2021-11-09 NOTE — ED Notes (Signed)
Vol/pending toc placement  

## 2021-11-09 NOTE — ED Notes (Signed)
Patient provided snack at appropriate snack time.  Pt consumed 100% of snack provided, tolerated well w/o complaints   Trash disposted of appropriately by patient.  

## 2021-11-09 NOTE — ED Notes (Signed)
Hospital meal provided.  100% consumed, pt tolerated w/o complaints.  Waste discarded appropriately.   

## 2021-11-09 NOTE — ED Notes (Signed)
Pt was given hygiene items as well as:  I clean top, 1 clean bottom, with 1 pair of disposable underwear.  Pt changed out into clean clothing.  Staff disposed of all shower supplies.

## 2021-11-09 NOTE — ED Notes (Signed)
Gave supper tray with sprite. 

## 2021-11-10 NOTE — ED Notes (Signed)
Hospital meal provided.  

## 2021-11-10 NOTE — ED Notes (Signed)
Snack was given. 

## 2021-11-10 NOTE — ED Notes (Signed)
VOl pending placement 

## 2021-11-10 NOTE — ED Provider Notes (Signed)
Emergency Medicine Observation Re-evaluation Note  Adam [name] is a 60 y.o. male, seen on rounds today.  Pt initially presented to the ED for complaints of Psychiatric Evaluation Currently, the patient is resting comfortably.  Physical Exam  BP (!) 141/95 (BP Location: Left Arm)   Pulse 81   Temp 98.3 F (36.8 C) (Oral)   Resp 16   Ht 5\' 8"  (1.727 m)   Wt 79.4 kg   SpO2 99%   BMI 26.61 kg/m  Physical Exam General: No acute distress Cardiac: Well-perfused extremities Lungs: No respiratory distress Psych: Appropriate mood and affect  ED Course / MDM  EKG:EKG Interpretation  Date/Time:  Monday August 28 2021 20:12:52 EDT Ventricular Rate:  117 PR Interval:  162 QRS Duration: 104 QT Interval:  306 QTC Calculation: 426 R Axis:   -17 Text Interpretation: Sinus tachycardia Otherwise normal ECG Confirmed by UNCONFIRMED, DOCTOR (08-25-1991), editor [id number], Tammy [phone]) on 08/29/2021 8:10:23 AM  I have reviewed the labs performed to date as well as medications administered while in observation.  Recent changes in the last 24 hours include none.  Plan  Current plan is for psychiatric placement.  Adam [name] is not under involuntary commitment.     [name], MD 11/10/21 [phone]

## 2021-11-10 NOTE — ED Notes (Signed)
Unlocked bathroom door to allow patient to shower.    Pt was given hygiene items as well as:  I clean top, 1 clean bottom, with 1 pair of disposable underwear.  Pt changed out into clean clothing.  Staff disposed of all shower supplies. Shower room cleaned and secured for next use.  

## 2021-11-10 NOTE — ED Notes (Signed)
Pt. Provided dinner tray

## 2021-11-10 NOTE — ED Notes (Signed)
Pt given the phone.   

## 2021-11-10 NOTE — ED Notes (Signed)
VOL/Pending Placement 

## 2021-11-10 NOTE — ED Notes (Signed)
Patient provided snack at appropriate snack time.  Pt consumed 100% of snack provided, tolerated well w/o complaints   Trash disposted of appropriately by patient.  

## 2021-11-10 NOTE — ED Notes (Signed)
Report to include Situation, Background, Assessment, and Recommendations received from [facility]. Patient alert and oriented, warm and dry, in no acute distress. UTA SI, HI, AVH and pain. Patient made aware of Q15 minute rounds and security cameras for their safety. Patient instructed to come to me with needs or concerns.

## 2021-11-11 NOTE — ED Notes (Signed)
.  meal

## 2021-11-11 NOTE — ED Notes (Signed)
Hospital meal provided, pt tolerated w/o complaints.  Waste discarded appropriately.  

## 2021-11-11 NOTE — ED Provider Notes (Signed)
Emergency Medicine Observation Re-evaluation Note  Adam [name] is a 60 y.o. male, seen on rounds today.  Pt initially presented to the ED for complaints of Psychiatric Evaluation Currently, the patient is resting comfortably.  Physical Exam  BP (!) 152/99 (BP Location: Right Arm)   Pulse 96   Temp 98.1 F (36.7 C) (Oral)   Resp 14   Ht 5\' 8"  (1.727 m)   Wt 79.4 kg   SpO2 95%   BMI 26.61 kg/m  Physical Exam General: No acute distress Lungs: No respiratory distress with even unlabored respirations Psych: Appropriate mood and affect, calm and resting  ED Course / MDM   I have reviewed the labs performed to date as well as medications administered while in observation.  Recent changes in the last 24 hours include none.  Plan  Current plan is for psychiatric placement.  Adam [name] is not under involuntary commitment.       [name], MD 11/11/21 [phone]

## 2021-11-11 NOTE — ED Notes (Signed)
VOL/pending placement 

## 2021-11-11 NOTE — ED Notes (Signed)
Snack and beverage given. 

## 2021-11-11 NOTE — ED Notes (Signed)
On initial round after report Pt is resting quietly in room without any s/s of distress.  Will continue to monitor throughout shift as ordered for any changes in behaviors and for continued safety.  °

## 2021-11-11 NOTE — ED Notes (Signed)
Report to include Situation, Background, Assessment, and Recommendations received from [name]. Patient alert and oriented X 2, warm and dry, in no acute distress. UTA SI, HI, AVH and pain. Patient made aware of Q15 minute rounds and security cameras for their safety. Patient instructed to come to me with needs or concerns.

## 2021-11-12 NOTE — ED Provider Notes (Signed)
Emergency Medicine Observation Re-evaluation Note  Marquette Blodgett is a 60 y.o. male, seen on rounds today.  Pt initially presented to the ED for complaints of Psychiatric Evaluation Currently, the patient is walking around the day room and has no acute complaints.  Physical Exam  BP (!) 128/97 (BP Location: Left Arm)   Pulse 80   Temp 98 F (36.7 C) (Oral)   Resp 20   Ht 5\' 8"  (1.727 m)   Wt 79.4 kg   SpO2 98%   BMI 26.61 kg/m  Physical Exam General: No distress Cardiac: Normal perfusion. Lungs: Normal respiratory effort Psych: Calm and cooperative.  ED Course / MDM   I have reviewed the labs performed to date as well as medications administered while in observation.  There have been no changes to his status in the last 24 hours.  Plan  Current plan is for placement  Boby Eyer is not under involuntary commitment.     [name], MD 11/12/21 [phone]

## 2021-11-12 NOTE — ED Notes (Signed)
Meal tray given 

## 2021-11-12 NOTE — ED Notes (Signed)
Pt given the phone to call his mom.

## 2021-11-12 NOTE — ED Notes (Signed)
Pt up to bathroom.

## 2021-11-12 NOTE — ED Notes (Signed)
Pt called his mom.

## 2021-11-12 NOTE — ED Notes (Signed)
Report to include Situation, Background, Assessment, and Recommendations received from Amy RN. Patient alert and oriented, warm and dry, in no acute distress. UTA SI, HI, AVH and pain. Patient made aware of Q15 minute rounds and security cameras for their safety. Patient instructed to come to me with needs or concerns.

## 2021-11-12 NOTE — ED Notes (Signed)
Snack and beverage given. 

## 2021-11-12 NOTE — ED Notes (Signed)
VS not taken, patient asleep 

## 2021-11-13 NOTE — ED Notes (Signed)
VOL/Pending Placement 

## 2021-11-13 NOTE — ED Notes (Signed)
Lunch tray given @1215 

## 2021-11-13 NOTE — ED Notes (Addendum)
Breakfast tray given. VS assessed. Shower offered. No other needs found.

## 2021-11-13 NOTE — ED Provider Notes (Signed)
Emergency Medicine Observation Re-evaluation Note  Adam [name] is a 60 y.o. male, seen on rounds today.  Pt initially presented to the ED for complaints of Psychiatric Evaluation Currently, the patient is resting comfortably.  Physical Exam  BP (!) 123/96 (BP Location: Left Arm)   Pulse (!) 102   Temp 97.7 F (36.5 C) (Oral)   Resp 18   Ht 5\' 8"  (1.727 m)   Wt 79.4 kg   SpO2 98%   BMI 26.61 kg/m  Physical Exam Constitutional:      Appearance: He is not ill-appearing or toxic-appearing.  HENT:     Head: Atraumatic.  Cardiovascular:     Comments: Appears well perfused Pulmonary:     Effort: Pulmonary effort is normal.  Abdominal:     General: There is no distension.  Musculoskeletal:        General: No deformity.  Neurological:     General: No focal deficit present.     ED Course / MDM  EKG:EKG Interpretation  Date/Time:  Monday August 28 2021 20:12:52 EDT Ventricular Rate:  117 PR Interval:  162 QRS Duration: 104 QT Interval:  306 QTC Calculation: 426 R Axis:   -17 Text Interpretation: Sinus tachycardia Otherwise normal ECG Confirmed by UNCONFIRMED, DOCTOR (08-25-1991), editor [id number], Tammy [phone]) on 08/29/2021 8:10:23 AM  I have reviewed the labs performed to date as well as medications administered while in observation.  Recent changes in the last 24 hours include none.  Plan  Current plan is for placement.  Adam [name] is not under involuntary commitment.     [name], MD 11/13/21 [phone]

## 2021-11-13 NOTE — TOC Progression Note (Signed)
Transition of Care [facility]) - Progression Note    Patient Details  Name: [name] MRN: [id number] Date of Birth: 12/29/1960  Transition of Care [facility]) CM/SW Contact  Allayne Butcher, RN Phone Number: 11/13/2021, 3:54 PM  Clinical Narrative:    [facility] Adam [name] as a patient but they would like for him and his mother to tour the facility.  Ross with [facility] will work on setting up a Paramedic.    Expected Discharge Plan: Group Home Barriers to Discharge: Continued Medical Work up  Expected Discharge Plan and Services Expected Discharge Plan: Group Home In-house Referral: Clinical Social Work     Living arrangements for the past 2 months: Group Home                                       Social Determinants of Health (SDOH) Interventions    Readmission Risk Interventions No flowsheet data found.

## 2021-11-13 NOTE — ED Notes (Signed)
Patient showered at 1339

## 2021-11-13 NOTE — ED Notes (Signed)
VS not taken, patient asleep 

## 2021-11-13 NOTE — ED Notes (Signed)
Pt is using the phone at this moment.

## 2021-11-13 NOTE — ED Notes (Signed)
Pt given dinner tray.

## 2021-11-13 NOTE — ED Notes (Signed)
VOL/pending placement 

## 2021-11-14 MED ORDER — VITAMIN D (ERGOCALCIFEROL) 1.25 MG (50000 UNIT) PO CAPS
50000.0000 [IU] | ORAL_CAPSULE | ORAL | Status: DC
  Administered 2021-11-15: 10:00:00 50000 [IU] via ORAL
  Filled 2021-11-14 (×2): qty 1

## 2021-11-14 NOTE — ED Notes (Signed)
VOL/pending placement 

## 2021-11-14 NOTE — ED Notes (Signed)
Hospital meal provided, pt tolerated w/o complaints.  Waste discarded appropriately.  

## 2021-11-14 NOTE — ED Notes (Signed)
Snack tray given with beverage

## 2021-11-14 NOTE — ED Notes (Signed)
On initial round after report Pt is resting quietly in room without any s/s of distress.  Will continue to monitor throughout shift as ordered for any changes in behaviors and for continued safety.  °

## 2021-11-14 NOTE — TOC Progression Note (Signed)
Transition of Care [facility]) - Progression Note    Patient Details  Name: Adam [name] MRN: [id number] Date of Birth: 1960-12-27  Transition of Care [facility]) CM/SW Contact  Allayne Butcher, RN Phone Number: 11/14/2021, 4:26 PM  Clinical Narrative:    [name] with [facility] would like to arrange meeting for virtual tour on Thursday with patient so he can see facility and who his new room mate might be, looking for placement maybe as soon as 12/20.   Expected Discharge Plan: Group Home Barriers to Discharge: Continued Medical Work up  Expected Discharge Plan and Services Expected Discharge Plan: Group Home In-house Referral: Clinical Social Work     Living arrangements for the past 2 months: Group Home                                       Social Determinants of Health (SDOH) Interventions    Readmission Risk Interventions No flowsheet data found.

## 2021-11-14 NOTE — ED Notes (Addendum)
Gave a chocolate milk.

## 2021-11-14 NOTE — ED Notes (Signed)
Pt given lunch tray.

## 2021-11-14 NOTE — ED Notes (Signed)
Patient provided snack at appropriate snack time.  Pt consumed 100% of snack provided, tolerated well w/o complaints   Trash disposted of appropriately by patient.  

## 2021-11-14 NOTE — ED Notes (Signed)
Pt refusing vital signs check.

## 2021-11-14 NOTE — ED Notes (Addendum)
Pt initially refused meds while in room, then out to dayroom and agreeable to take. Pt then swallowed half of them and  spit them the rest back in cup. RN was able to identify pills not taken and marked as refused. Pt very agitated, yelling loudly and uncooperative with care.

## 2021-11-15 MED ORDER — TRAMADOL HCL 50 MG PO TABS
50.0000 mg | ORAL_TABLET | Freq: Two times a day (BID) | ORAL | Status: DC | PRN
  Administered 2021-11-15: 13:00:00 50 mg via ORAL
  Filled 2021-11-15: qty 1

## 2021-11-15 MED ORDER — TRAMADOL HCL 50 MG PO TABS
50.0000 mg | ORAL_TABLET | Freq: Two times a day (BID) | ORAL | Status: DC | PRN
  Administered 2021-11-18 – 2021-11-21 (×3): 50 mg via ORAL
  Filled 2021-11-15 (×3): qty 1

## 2021-11-15 NOTE — ED Notes (Signed)
Pt given dinner  

## 2021-11-15 NOTE — ED Notes (Signed)
Pt increasingly agitated, yelling for phone. EDT Ian Malkin reminded pt of phone hours. Refer to [facility] EDT notes @1946  for interaction with pt. Pt is now banging on window and demanding for phone. Pt also acting aggressive towards staff.  Will attempt to verbally deescalate pt.

## 2021-11-15 NOTE — ED Notes (Signed)
Pt given snack, pt sitting comfortably in day room with no current agitation towards staff

## 2021-11-15 NOTE — ED Notes (Signed)
Pt given snack. 

## 2021-11-15 NOTE — ED Notes (Signed)
Unlocked bathroom door to allow patient to shower.  Staff continuously monitored dayroom outside of bathroom door during pt shower.  Pt was given hygiene items as well as:  I clean top, 1 clean bottom, with 1 pair of disposable underwear.  Pt changed out into clean clothing.  Staff disposed of all shower supplies. Shower room cleaned and secured for next use.  

## 2021-11-15 NOTE — ED Notes (Signed)
Patient provided snack at appropriate snack time.  Pt consumed 100% of snack provided, tolerated well w/o complaints   Trash disposted of appropriately by patient.  

## 2021-11-15 NOTE — ED Notes (Signed)
VOL/pending placement 

## 2021-11-15 NOTE — TOC Progression Note (Signed)
Transition of Care [facility]) - Progression Note    Patient Details  Name: Adam [name] MRN: [id number] Date of Birth: Jan 27, 1961  [facility]) CM/SW Contact  Allayne Butcher, RN Phone Number: 11/15/2021, 2:55 PM  Clinical Narrative:     Meeting scheduled for tomorrow for video visit with [facility] for evaluation at 1 pm.   Expected Discharge Plan: Group Home Barriers to Discharge: Continued Medical Work up  Expected Discharge Plan and Services Expected Discharge Plan: Group Home In-house Referral: Clinical Social Work     Living arrangements for the past 2 months: Group Home                                       Social Determinants of Health (SDOH) Interventions    Readmission Risk Interventions No flowsheet data found.

## 2021-11-15 NOTE — ED Notes (Signed)
On initial round after report Pt is resting quietly in room without any s/s of distress.  Will continue to monitor throughout shift as ordered for any changes in behaviors and for continued safety.  °

## 2021-11-15 NOTE — ED Provider Notes (Signed)
----------------------------------------- °  1:17 PM on 11/15/2021 ----------------------------------------- Patient appeared to be complaining of right arm discomfort.  I evaluated the patient personally.  Arm is warm and well-perfused with 2+ radial pulse.  Good cap refill in all fingers.  Good range of motion in fingers wrist elbow and shoulder.  No obvious bruising or swelling noted.  Soft compartments.  Does not react to palpation.  We will try a small dose of tramadol.  Patient is already on gabapentin.  Possibly could be neuropathy related discomfort.   Minna Antis, MD 11/15/21 1319

## 2021-11-15 NOTE — ED Provider Notes (Signed)
Emergency Medicine Observation Re-evaluation Note  Adam [name] is a 60 y.o. male, seen on rounds today.  Pt initially presented to the ED for complaints of Psychiatric Evaluation  Currently, the patient is resting   Physical Exam  Blood pressure (!) 127/96, pulse 84, temperature 97.8 F (36.6 C), temperature source Oral, resp. rate 20, height 5\' 8"  (1.727 m), weight 79.4 kg, SpO2 98 %.  Physical Exam General: No apparent distress Neuro:  resting      ED Course / MDM   I have reviewed the labs performed to date as well as medications administered while in observation.  Recent changes in the last 24 hours include vit D added on to patient given prolong time in hospital   Plan   Current plan is to continue to wait for placement  Patient is not under full IVC at this time.   , MD 11/15/21 [phone]

## 2021-11-15 NOTE — ED Notes (Signed)
Hospital meal provided, pt tolerated w/o complaints.  Waste discarded appropriately.  
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
## 2021-11-16 NOTE — ED Notes (Signed)
No change in condition, will continue to monitor.  

## 2021-11-16 NOTE — ED Notes (Signed)
Pt refusing medications and VS at this time. Pt yelling "NO" at staff.

## 2021-11-16 NOTE — ED Notes (Signed)
Pt on video call with potential group home. Social work  and security with patient in dayroom

## 2021-11-16 NOTE — ED Notes (Signed)
Patient resting comfortably in room. No complaints or concerns voiced. No distress or abnormal behavior noted. Will continue to monitor with security cameras. Q 15 minute rounds continue. 

## 2021-11-16 NOTE — ED Notes (Signed)
Pt picked white pill out of cup.  RN believes this is the Ativan.  Will verify with another nurse and waste.

## 2021-11-16 NOTE — ED Notes (Signed)
VOL/Pending Placement 

## 2021-11-16 NOTE — TOC Progression Note (Signed)
Transition of Care [facility]) - Progression Note    Patient Details  Name: Adam [name] MRN: [id number] Date of Birth: 09-Nov-1961  Transition of Care [name]) CM/SW Contact  Allayne Butcher, RN Phone Number: 11/16/2021, 2:19 PM  Clinical Narrative:    Meeting with [facility] lasted a little over an hour via [employer].  [facility] is interested but they are not sure they are willing to commit to accepting patient at this time.     Expected Discharge Plan: Group Home Barriers to Discharge: Continued Medical Work up  Expected Discharge Plan and Services Expected Discharge Plan: Group Home In-house Referral: Clinical Social Work     Living arrangements for the past 2 months: Group Home                                       Social Determinants of Health (SDOH) Interventions    Readmission Risk Interventions No flowsheet data found.

## 2021-11-16 NOTE — ED Notes (Signed)
VOl pending placement 

## 2021-11-16 NOTE — ED Notes (Signed)
Pt bedside cleaned.  ?

## 2021-11-16 NOTE — ED Provider Notes (Signed)
Emergency Medicine Observation Re-evaluation Note  Adam [name] is a 60 y.o. male, seen on rounds today.  Pt initially presented to the ED for complaints of Psychiatric Evaluation   Physical Exam  BP 122/89 (BP Location: Left Arm)    Pulse 86    Temp 97.9 F (36.6 C) (Oral)    Resp 18    Ht 5\' 8"  (1.727 m)    Wt 79.4 kg    SpO2 100%    BMI 26.61 kg/m  Physical Exam General: NAD Cardiac: well perfused Lungs: unlabored Psych: NAD  ED Course / MDM  EKG:EKG Interpretation  Date/Time:  Monday August 28 2021 20:12:52 EDT Ventricular Rate:  117 PR Interval:  162 QRS Duration: 104 QT Interval:  306 QTC Calculation: 426 R Axis:   -17 Text Interpretation: Sinus tachycardia Otherwise normal ECG Confirmed by UNCONFIRMED, DOCTOR (08-25-1991), editor [id number], Tammy [phone]) on 08/29/2021 8:10:23 AM  I have reviewed the labs performed to date as well as medications administered while in observation.  Recent changes in the last 24 hours include .  Plan  Current plan is for [facility].  Adam [name] is not under involuntary commitment.     Adam Sharps, MD 11/16/21 [phone]

## 2021-11-16 NOTE — ED Notes (Addendum)
Report received from [facility]. Patient care assumed. Will continue to monitor.

## 2021-11-16 NOTE — ED Notes (Signed)
Pt returned to his room and laid down.

## 2021-11-16 NOTE — ED Notes (Signed)
Pt. Given nighttime snack. °

## 2021-11-17 LAB — RESP PANEL BY RT-PCR (FLU A&B, COVID) ARPGX2
Influenza A by PCR: NEGATIVE
Influenza B by PCR: NEGATIVE
SARS Coronavirus 2 by RT PCR: NEGATIVE

## 2021-11-17 NOTE — ED Notes (Signed)
Pt given snack. 

## 2021-11-17 NOTE — ED Notes (Signed)
Pt given nighttime snack. 

## 2021-11-17 NOTE — ED Provider Notes (Signed)
Emergency Medicine Observation Re-evaluation Note  Adam [name] is a 60 y.o. male, seen on rounds today.  Pt initially presented to the ED for complaints of Psychiatric Evaluation Currently, the patient is calm, resting.  Physical Exam  BP 124/81 (BP Location: Left Arm)    Pulse 94    Temp 98.5 F (36.9 C) (Oral)    Resp 20    Ht 5\' 8"  (1.727 m)    Wt 79.4 kg    SpO2 99%    BMI 26.61 kg/m  Physical Exam General: NAD Cardiac: Well-perfused Lungs: Normal WOB Psych: Resting  ED Course / MDM  EKG:EKG Interpretation  Date/Time:  Monday August 28 2021 20:12:52 EDT Ventricular Rate:  117 PR Interval:  162 QRS Duration: 104 QT Interval:  306 QTC Calculation: 426 R Axis:   -17 Text Interpretation: Sinus tachycardia Otherwise normal ECG Confirmed by UNCONFIRMED, DOCTOR (08-25-1991), editor [id number], Tammy [phone]) on 08/29/2021 8:10:23 AM  I have reviewed the labs performed to date as well as medications administered while in observation.  Recent changes in the last 24 hours include None.  Plan  Current plan is for Adam [name] placement/disposition.  Adam [name] is not under involuntary commitment.     [name], MD 11/17/21 [phone]

## 2021-11-17 NOTE — ED Notes (Signed)
Report received from off going RN.  Patient resting in bed.  No distress noted. Q 15 minute safety rounding in place.  Pt informed to notify nursing staff if any needs arise.   °

## 2021-11-17 NOTE — ED Notes (Signed)
Pt asleep, not able to get vs.

## 2021-11-18 MED ORDER — LOPERAMIDE HCL 2 MG PO CAPS
4.0000 mg | ORAL_CAPSULE | Freq: Once | ORAL | Status: AC
Start: 2021-11-18 — End: 2021-11-18
  Administered 2021-11-18: 4 mg via ORAL
  Filled 2021-11-18 (×2): qty 2

## 2021-11-18 NOTE — ED Notes (Signed)
Unlocked bathroom door to allow patient to shower.  Staff continuously monitored dayroom outside of bathroom door during pt shower.  Pt was given hygiene items as well as:  I clean top, 1 clean bottom, with 1 pair of disposable underwear.  Pt changed out into clean clothing.  Staff disposed of all shower supplies. Shower room cleaned and secured for next use.  

## 2021-11-18 NOTE — ED Notes (Signed)
VOL/pending placement 

## 2021-11-18 NOTE — ED Notes (Signed)
Hospital meal provided, pt tolerated w/o complaints.  Waste discarded appropriately.  

## 2021-11-18 NOTE — ED Notes (Signed)
On initial round after report Pt is resting quietly in room without any s/s of distress.  Will continue to monitor throughout shift as ordered for any changes in behaviors and for continued safety.  °

## 2021-11-18 NOTE — ED Notes (Signed)
Vol /pending placement 

## 2021-11-18 NOTE — ED Notes (Signed)
Pt initially refused oral medications.  Adam [name] was repeating "Nothing else matters" over and over again.  Staff asked Adam [name] is that a song and he excitedly stated "Yes oh God yes."  Staff allowed Adam [name] to listen to this requested song with the understanding that once it was over he would take his oral afternoon medication.  Once the song ended he took his medications w/o further incident.

## 2021-11-18 NOTE — ED Provider Notes (Signed)
Emergency Medicine Observation Re-evaluation Note  Adam [name] is a 60 y.o. male, seen on rounds today.  Pt initially presented to the ED for complaints of Psychiatric Evaluation Currently, the patient is sitting and eating.  Physical Exam  BP (!) 127/93 (BP Location: Left Arm)    Pulse 96    Temp 98.3 F (36.8 C) (Oral)    Resp 19    Ht 5\' 8"  (1.727 m)    Wt 79.4 kg    SpO2 98%    BMI 26.61 kg/m  Physical Exam General: No acute distress, eating Lungs: No respiratory distress Psych: No agitation  ED Course / MDM  EKG:EKG Interpretation  Date/Time:  Monday August 28 2021 20:12:52 EDT Ventricular Rate:  117 PR Interval:  162 QRS Duration: 104 QT Interval:  306 QTC Calculation: 426 R Axis:   -17 Text Interpretation: Sinus tachycardia Otherwise normal ECG Confirmed by UNCONFIRMED, DOCTOR (08-25-1991), editor [id number], Tammy [phone]) on 08/29/2021 8:10:23 AM  I have reviewed the labs performed to date as well as medications administered while in observation.  Recent changes in the last 24 hours include patient has been acting erratically, eating feces.  Plan  Current plan is for SW placement  Jaison Petraglia is not under involuntary commitment.     [name], MD 11/18/21 [phone]

## 2021-11-19 NOTE — ED Notes (Signed)
Pt refusing vitals at this time, will try again after lunch.

## 2021-11-19 NOTE — ED Notes (Signed)
Patient observed scooting on floor on buttocks around day room.

## 2021-11-19 NOTE — ED Notes (Signed)
Pt given breakfast tray

## 2021-11-19 NOTE — ED Notes (Signed)
Hospital meal provided, pt tolerated w/o complaints.  Waste discarded appropriately.  

## 2021-11-19 NOTE — ED Notes (Signed)
When this RN in to check on adolescents patient walking towards connecting door and arguing with Engineer, materials.

## 2021-11-19 NOTE — ED Provider Notes (Signed)
Emergency Medicine Observation Re-evaluation Note  Ashaad Gaertner is a 60 y.o. male, seen on rounds today.  Pt initially presented to the ED for complaints of Psychiatric Evaluation Currently, the patient is sitting and eating.  Physical Exam  BP 102/71 (BP Location: Right Arm)    Pulse 93    Temp 98.4 F (36.9 C) (Oral)    Resp 20    Ht 5\' 8"  (1.727 m)    Wt 79.4 kg    SpO2 95%    BMI 26.61 kg/m  Physical Exam General: No acute distress, sleeping comfortably Lungs: No respiratory distress Psych: No agitation  ED Course / MDM    I have reviewed the labs performed to date as well as medications administered while in observation.  Recent changes in the last 24 hours include no noted concerns, but yesterday evidently had been possibly eating lotion and had loose stool.  Yesterday he also reportedly walked out into the day room half naked but was able to be redirected  Plan  Current plan is for SW placement     , MD 11/19/21 [phone]

## 2021-11-19 NOTE — ED Notes (Signed)
Pt still refusing vitals at this time. Will try again at a later time

## 2021-11-19 NOTE — ED Notes (Signed)
Pt given dinner tray.

## 2021-11-19 NOTE — ED Notes (Signed)
On initial round after report Pt is resting quietly in room without any s/s of distress.  Will continue to monitor throughout shift as ordered for any changes in behaviors and for continued safety.  °

## 2021-11-19 NOTE — ED Notes (Signed)
Patient walking around day room yelling unintelligible words and walking to door at nsg station and banging on window.

## 2021-11-19 NOTE — ED Notes (Signed)
This RN, Wilber Oliphant and [employer] into to patient's room.  Caleb held patients right arm and Fraser Din secured patient's left arm while this RN medicated patient in right deltoid.  Patient release immediately after injections given.  Patient tolerated well.

## 2021-11-19 NOTE — ED Notes (Signed)
Patient continues to scoot on floor on buttocks around dayroom.

## 2021-11-19 NOTE — ED Notes (Signed)
Pt is seen scooting back and fourth to his room on the floor

## 2021-11-19 NOTE — ED Notes (Signed)
Patient walking to all doors and pulling on door handles and banging on windows.

## 2021-11-19 NOTE — ED Notes (Addendum)
Pt was at the Haematologist opened the door to see what pt wanted. Pt starts putting his hand on the door frame and tried pushing into nurses station. writer tried closing the door but pt would not move his hand. Security closed the door with no issue

## 2021-11-19 NOTE — ED Notes (Signed)
Patient observed sitting on floor in his room.

## 2021-11-19 NOTE — ED Provider Notes (Signed)
Seated without distress in day room.  Has a copy of the bottle in his hands.  He does not have any concerns in the does not appear to be in any distress.  Resting comfortably   Sharyn Creamer, MD 11/19/21 1322

## 2021-11-19 NOTE — ED Notes (Signed)
Pt woke up this a.m. yelling to self alone in room; Adam [name] is yelling "freedom, freedom, guitars, guitars."  Pt had spilled some of his juice from his breakfast tray onto his be the a.m. as well.  Staff went into room and changed bed linens.

## 2021-11-19 NOTE — ED Notes (Signed)
Pt received snack and drink 

## 2021-11-20 ENCOUNTER — Emergency Department: Payer: Medicare HMO

## 2021-11-20 DIAGNOSIS — Z66 Do not resuscitate: Secondary | ICD-10-CM | POA: Diagnosis present

## 2021-11-20 DIAGNOSIS — K5900 Constipation, unspecified: Secondary | ICD-10-CM | POA: Diagnosis present

## 2021-11-20 DIAGNOSIS — W19XXXS Unspecified fall, sequela: Secondary | ICD-10-CM | POA: Diagnosis not present

## 2021-11-20 DIAGNOSIS — Z886 Allergy status to analgesic agent status: Secondary | ICD-10-CM | POA: Diagnosis not present

## 2021-11-20 DIAGNOSIS — E785 Hyperlipidemia, unspecified: Secondary | ICD-10-CM | POA: Diagnosis not present

## 2021-11-20 DIAGNOSIS — G9389 Other specified disorders of brain: Secondary | ICD-10-CM | POA: Diagnosis present

## 2021-11-20 DIAGNOSIS — Z885 Allergy status to narcotic agent status: Secondary | ICD-10-CM | POA: Diagnosis not present

## 2021-11-20 DIAGNOSIS — G3189 Other specified degenerative diseases of nervous system: Secondary | ICD-10-CM | POA: Diagnosis not present

## 2021-11-20 DIAGNOSIS — R4689 Other symptoms and signs involving appearance and behavior: Secondary | ICD-10-CM | POA: Diagnosis not present

## 2021-11-20 DIAGNOSIS — I2510 Atherosclerotic heart disease of native coronary artery without angina pectoris: Secondary | ICD-10-CM | POA: Diagnosis present

## 2021-11-20 DIAGNOSIS — J690 Pneumonitis due to inhalation of food and vomit: Secondary | ICD-10-CM | POA: Diagnosis present

## 2021-11-20 DIAGNOSIS — R748 Abnormal levels of other serum enzymes: Secondary | ICD-10-CM | POA: Diagnosis not present

## 2021-11-20 DIAGNOSIS — S069X0S Unspecified intracranial injury without loss of consciousness, sequela: Secondary | ICD-10-CM | POA: Diagnosis not present

## 2021-11-20 DIAGNOSIS — F09 Unspecified mental disorder due to known physiological condition: Secondary | ICD-10-CM

## 2021-11-20 DIAGNOSIS — F259 Schizoaffective disorder, unspecified: Secondary | ICD-10-CM | POA: Diagnosis present

## 2021-11-20 DIAGNOSIS — R339 Retention of urine, unspecified: Secondary | ICD-10-CM | POA: Diagnosis not present

## 2021-11-20 DIAGNOSIS — G9341 Metabolic encephalopathy: Secondary | ICD-10-CM | POA: Diagnosis present

## 2021-11-20 DIAGNOSIS — Z20822 Contact with and (suspected) exposure to covid-19: Secondary | ICD-10-CM | POA: Diagnosis present

## 2021-11-20 DIAGNOSIS — S069XAS Unspecified intracranial injury with loss of consciousness status unknown, sequela: Secondary | ICD-10-CM

## 2021-11-20 DIAGNOSIS — I219 Acute myocardial infarction, unspecified: Secondary | ICD-10-CM

## 2021-11-20 DIAGNOSIS — I5021 Acute systolic (congestive) heart failure: Secondary | ICD-10-CM | POA: Diagnosis not present

## 2021-11-20 DIAGNOSIS — F205 Residual schizophrenia: Secondary | ICD-10-CM | POA: Diagnosis not present

## 2021-11-20 DIAGNOSIS — Z87820 Personal history of traumatic brain injury: Secondary | ICD-10-CM | POA: Diagnosis not present

## 2021-11-20 DIAGNOSIS — I214 Non-ST elevation (NSTEMI) myocardial infarction: Secondary | ICD-10-CM | POA: Diagnosis present

## 2021-11-20 DIAGNOSIS — R451 Restlessness and agitation: Secondary | ICD-10-CM | POA: Diagnosis not present

## 2021-11-20 DIAGNOSIS — J810 Acute pulmonary edema: Secondary | ICD-10-CM

## 2021-11-20 DIAGNOSIS — H60392 Other infective otitis externa, left ear: Secondary | ICD-10-CM | POA: Diagnosis not present

## 2021-11-20 DIAGNOSIS — N390 Urinary tract infection, site not specified: Secondary | ICD-10-CM | POA: Diagnosis present

## 2021-11-20 DIAGNOSIS — Z751 Person awaiting admission to adequate facility elsewhere: Secondary | ICD-10-CM | POA: Diagnosis not present

## 2021-11-20 DIAGNOSIS — I10 Essential (primary) hypertension: Secondary | ICD-10-CM | POA: Diagnosis not present

## 2021-11-20 DIAGNOSIS — R3915 Urgency of urination: Secondary | ICD-10-CM | POA: Diagnosis present

## 2021-11-20 DIAGNOSIS — R509 Fever, unspecified: Secondary | ICD-10-CM | POA: Diagnosis not present

## 2021-11-20 DIAGNOSIS — I959 Hypotension, unspecified: Secondary | ICD-10-CM | POA: Diagnosis present

## 2021-11-20 DIAGNOSIS — R32 Unspecified urinary incontinence: Secondary | ICD-10-CM | POA: Diagnosis present

## 2021-11-20 DIAGNOSIS — Z91148 Patient's other noncompliance with medication regimen for other reason: Secondary | ICD-10-CM | POA: Diagnosis not present

## 2021-11-20 DIAGNOSIS — E860 Dehydration: Secondary | ICD-10-CM | POA: Diagnosis present

## 2021-11-20 DIAGNOSIS — I5043 Acute on chronic combined systolic (congestive) and diastolic (congestive) heart failure: Secondary | ICD-10-CM | POA: Diagnosis present

## 2021-11-20 DIAGNOSIS — S069X9S Unspecified intracranial injury with loss of consciousness of unspecified duration, sequela: Secondary | ICD-10-CM | POA: Diagnosis not present

## 2021-11-20 DIAGNOSIS — I639 Cerebral infarction, unspecified: Secondary | ICD-10-CM | POA: Diagnosis not present

## 2021-11-20 DIAGNOSIS — F2089 Other schizophrenia: Secondary | ICD-10-CM | POA: Diagnosis not present

## 2021-11-20 DIAGNOSIS — I5022 Chronic systolic (congestive) heart failure: Secondary | ICD-10-CM | POA: Diagnosis not present

## 2021-11-20 DIAGNOSIS — Z87828 Personal history of other (healed) physical injury and trauma: Secondary | ICD-10-CM | POA: Diagnosis not present

## 2021-11-20 DIAGNOSIS — E512 Wernicke's encephalopathy: Secondary | ICD-10-CM | POA: Diagnosis present

## 2021-11-20 DIAGNOSIS — Z79899 Other long term (current) drug therapy: Secondary | ICD-10-CM | POA: Diagnosis not present

## 2021-11-20 DIAGNOSIS — I69320 Aphasia following cerebral infarction: Secondary | ICD-10-CM | POA: Diagnosis not present

## 2021-11-20 DIAGNOSIS — F209 Schizophrenia, unspecified: Secondary | ICD-10-CM | POA: Diagnosis not present

## 2021-11-20 LAB — CBC WITH DIFFERENTIAL/PLATELET
Abs Immature Granulocytes: 0.04 10*3/uL (ref 0.00–0.07)
Basophils Absolute: 0 10*3/uL (ref 0.0–0.1)
Basophils Relative: 1 %
Eosinophils Absolute: 0.3 10*3/uL (ref 0.0–0.5)
Eosinophils Relative: 4 %
HCT: 32.5 % — ABNORMAL LOW (ref 39.0–52.0)
Hemoglobin: 10.9 g/dL — ABNORMAL LOW (ref 13.0–17.0)
Immature Granulocytes: 1 %
Lymphocytes Relative: 20 %
Lymphs Abs: 1.3 10*3/uL (ref 0.7–4.0)
MCH: 32.1 pg (ref 26.0–34.0)
MCHC: 33.5 g/dL (ref 30.0–36.0)
MCV: 95.6 fL (ref 80.0–100.0)
Monocytes Absolute: 0.9 10*3/uL (ref 0.1–1.0)
Monocytes Relative: 14 %
Neutro Abs: 3.9 10*3/uL (ref 1.7–7.7)
Neutrophils Relative %: 60 %
Platelets: 169 10*3/uL (ref 150–400)
RBC: 3.4 MIL/uL — ABNORMAL LOW (ref 4.22–5.81)
RDW: 12.8 % (ref 11.5–15.5)
WBC: 6.4 10*3/uL (ref 4.0–10.5)
nRBC: 0 % (ref 0.0–0.2)

## 2021-11-20 LAB — BASIC METABOLIC PANEL
Anion gap: 6 (ref 5–15)
BUN: 43 mg/dL — ABNORMAL HIGH (ref 8–23)
CO2: 26 mmol/L (ref 22–32)
Calcium: 8.8 mg/dL — ABNORMAL LOW (ref 8.9–10.3)
Chloride: 106 mmol/L (ref 98–111)
Creatinine, Ser: 1.57 mg/dL — ABNORMAL HIGH (ref 0.61–1.24)
GFR, Estimated: 50 mL/min — ABNORMAL LOW (ref >60)
Glucose, Bld: 111 mg/dL — ABNORMAL HIGH (ref 70–99)
Potassium: 4.6 mmol/L (ref 3.5–5.1)
Sodium: 138 mmol/L (ref 135–145)

## 2021-11-20 LAB — LIPID PANEL
Cholesterol: 167 mg/dL (ref 0–200)
HDL: 26 mg/dL — ABNORMAL LOW (ref >40)
LDL Cholesterol: 120 mg/dL — ABNORMAL HIGH (ref 0–99)
Total CHOL/HDL Ratio: 6.4 RATIO
Triglycerides: 107 mg/dL (ref <150)
VLDL: 21 mg/dL (ref 0–40)

## 2021-11-20 LAB — RESP PANEL BY RT-PCR (FLU A&B, COVID) ARPGX2
Influenza A by PCR: NEGATIVE
Influenza B by PCR: NEGATIVE
SARS Coronavirus 2 by RT PCR: NEGATIVE

## 2021-11-20 LAB — CBG MONITORING, ED
Glucose-Capillary: 87 mg/dL (ref 70–99)
Glucose-Capillary: 108 mg/dL — ABNORMAL HIGH (ref 70–99)

## 2021-11-20 LAB — BRAIN NATRIURETIC PEPTIDE: B Natriuretic Peptide: 886.3 pg/mL — ABNORMAL HIGH (ref 0.0–100.0)

## 2021-11-20 LAB — TROPONIN I (HIGH SENSITIVITY)
Troponin I (High Sensitivity): 23273 ng/L (ref <18)
Troponin I (High Sensitivity): 22417 ng/L (ref <18)

## 2021-11-20 MED ORDER — NITROGLYCERIN 0.4 MG SL SUBL: 0.4000 mg | SUBLINGUAL_TABLET | SUBLINGUAL | Status: DC | PRN

## 2021-11-20 MED ORDER — ASPIRIN 81 MG PO CHEW
324.0000 mg | CHEWABLE_TABLET | ORAL | Status: AC
  Administered 2021-11-20: 23:00:00 324 mg via ORAL
  Filled 2021-11-20: qty 4

## 2021-11-20 MED ORDER — ACETAMINOPHEN 325 MG PO TABS
650.0000 mg | ORAL_TABLET | ORAL | Status: DC | PRN
  Administered 2021-12-01 – 2022-02-26 (×20): 650 mg via ORAL
  Filled 2021-11-20 (×24): qty 2

## 2021-11-20 MED ORDER — ATORVASTATIN CALCIUM 20 MG PO TABS
40.0000 mg | ORAL_TABLET | Freq: Every day | ORAL | Status: DC
  Administered 2021-11-20 – 2022-01-16 (×53): 40 mg via ORAL
  Filled 2021-11-20 (×56): qty 2

## 2021-11-20 MED ORDER — DIPHENHYDRAMINE HCL 50 MG/ML IJ SOLN
INTRAMUSCULAR | Status: AC
  Filled 2021-11-20: qty 1

## 2021-11-20 MED ORDER — HALOPERIDOL LACTATE 5 MG/ML IJ SOLN
5.0000 mg | Freq: Four times a day (QID) | INTRAMUSCULAR | Status: DC | PRN
  Administered 2021-11-20: 21:00:00 5 mg via INTRAVENOUS

## 2021-11-20 MED ORDER — LORAZEPAM 2 MG/ML IJ SOLN
2.0000 mg | Freq: Once | INTRAMUSCULAR | Status: AC
  Administered 2021-11-20: 21:00:00 2 mg via INTRAVENOUS

## 2021-11-20 MED ORDER — ASPIRIN 300 MG RE SUPP: 300.0000 mg | RECTAL | Status: AC

## 2021-11-20 MED ORDER — ASPIRIN 81 MG PO CHEW: 324.0000 mg | CHEWABLE_TABLET | Freq: Once | ORAL | Status: DC

## 2021-11-20 MED ORDER — LORAZEPAM 2 MG/ML IJ SOLN: 1.0000 mg | Freq: Once | INTRAMUSCULAR | Status: DC

## 2021-11-20 MED ORDER — DIPHENHYDRAMINE HCL 50 MG/ML IJ SOLN
25.0000 mg | Freq: Once | INTRAMUSCULAR | Status: AC
  Administered 2021-11-20: 21:00:00 25 mg via INTRAVENOUS

## 2021-11-20 MED ORDER — HEPARIN BOLUS VIA INFUSION
4000.0000 [IU] | Freq: Once | INTRAVENOUS | Status: AC
  Administered 2021-11-20: 20:00:00 4000 [IU] via INTRAVENOUS
  Filled 2021-11-20: qty 4000

## 2021-11-20 MED ORDER — SODIUM CHLORIDE 0.9 % IV BOLUS
500.0000 mL | Freq: Once | INTRAVENOUS | Status: AC
  Administered 2021-11-20: 17:00:00 500 mL via INTRAVENOUS

## 2021-11-20 MED ORDER — DROPERIDOL 2.5 MG/ML IJ SOLN
2.5000 mg | Freq: Once | INTRAMUSCULAR | Status: DC
Start: 2021-11-20 — End: 2021-11-20

## 2021-11-20 MED ORDER — LORAZEPAM 2 MG/ML IJ SOLN
INTRAMUSCULAR | Status: AC
  Filled 2021-11-20: qty 1

## 2021-11-20 MED ORDER — ASPIRIN EC 81 MG PO TBEC
81.0000 mg | DELAYED_RELEASE_TABLET | Freq: Every day | ORAL | Status: DC
  Administered 2021-11-21 – 2021-12-03 (×9): 81 mg via ORAL
  Filled 2021-11-20 (×10): qty 1

## 2021-11-20 MED ORDER — HALOPERIDOL LACTATE 5 MG/ML IJ SOLN
INTRAMUSCULAR | Status: AC
  Filled 2021-11-20: qty 1

## 2021-11-20 MED ORDER — ONDANSETRON HCL 4 MG/2ML IJ SOLN: 4.0000 mg | Freq: Four times a day (QID) | INTRAMUSCULAR | Status: DC | PRN

## 2021-11-20 MED ORDER — LORAZEPAM 2 MG/ML IJ SOLN
1.0000 mg | INTRAMUSCULAR | Status: DC | PRN
  Administered 2021-11-21 – 2021-12-04 (×15): 1 mg via INTRAVENOUS
  Filled 2021-11-20 (×19): qty 1

## 2021-11-20 MED ORDER — LORAZEPAM 2 MG/ML IJ SOLN
2.0000 mg | Freq: Once | INTRAMUSCULAR | Status: AC
  Administered 2021-11-21: 21:00:00 2 mg via INTRAVENOUS
  Filled 2021-11-20: qty 1

## 2021-11-20 MED ORDER — HEPARIN (PORCINE) 25000 UT/250ML-% IV SOLN
1800.0000 [IU]/h | INTRAVENOUS | Status: AC
  Administered 2021-11-20: 20:00:00 950 [IU]/h via INTRAVENOUS
  Filled 2021-11-20 (×3): qty 250

## 2021-11-20 NOTE — ED Notes (Signed)
Patient at doorway yelling.  Bathroom door unlocked patient in.  When patient out of bathroom, he was holding toilet paper.  Patient instructed he would have to give the toilet paper back to staff, patient yelling know.  Again asked for toilet paper back, patient again yelled no and walked towards his room.  In patient's room patient handed back toilet papeer.

## 2021-11-20 NOTE — Assessment & Plan Note (Addendum)
EDP verified pt's DNR/DNI status with his mother (Adam [name])

## 2021-11-20 NOTE — ED Notes (Addendum)
Pt sitting in bed yelling "No."  Pt would not look at or speak to this Clinical research associate. This is not typical  behavior for the patient.

## 2021-11-20 NOTE — ED Notes (Signed)
Pt refusing labs and chest x-ray.  EDP and charge nurse made aware.
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
## 2021-11-20 NOTE — ED Notes (Signed)
Dr. Chen at bedside.

## 2021-11-20 NOTE — Assessment & Plan Note (Addendum)
NSTEMI with elevated troponin (peaked to 23273), EKG with Q waves.Treated q 48 hrs heparin gtt - Continue aspirin, atorvastatin, metoprolol - Invasive evaluation deferred per cardiology.  - Monitoring for anginal signs or symptoms which are currently resolved.

## 2021-11-20 NOTE — ED Notes (Signed)
EDP made aware of pt being lethargic, and low oxygen stats.

## 2021-11-20 NOTE — ED Notes (Signed)
VOL, pend medical admit

## 2021-11-20 NOTE — ED Notes (Signed)
EDP made aware of pt's behavior this morning and continued refusal of VS.

## 2021-11-20 NOTE — ED Provider Notes (Addendum)
Discussed with his mother, he is DNR and DO not intubate per mother. She is aware of major change in condition. HCPOA.    Sharyn Creamer, MD 11/20/21 1702    Compared with my evaluation with the patient yesterday where he and he was sitting in a chair without complaint reading his Bible, he certainly has a significant change today.  He is noted to have hypotension, had a brief period of noted hypoxia, and his mental status is that of somnolence though he does alert to voice he seems notably more fatigued and has been a significant change in his status.  In review of his ECG tracings and his recent work-up I suspect have high concern he may have experienced an event of ACS sometime in the last day or evening.     Sharyn Creamer, MD 11/20/21 [phone]

## 2021-11-20 NOTE — Consult Note (Signed)
Cardiology Consultation:   Patient ID: Adam [name] MRN: [id number]; DOB: 1961-02-12  Admit date: 07/20/2021 Date of Consult: 11/20/2021  PCP:  Oneita Hurt, No   [facility] Providers Cardiologist:  None        Patient Profile:   Adam [name] is a 60 y.o. male with a hx of schizophrenia and TBI who is being seen 11/20/2021 for the evaluation of myocardial infarction at the request of Dr. Fanny Bien.  History of Present Illness:   Adam [name] is a 60 year old male with history of schizophrenia and TBI who has been staying at [facility] ER since August 2022 awaiting psychiatric placement.  He was initially admitted due to violent outburst at his group home.  The patient was noted to be more lethargic with hypotension and hypoxia today and thus he underwent further work-up with labs which showed a troponin of 23,000.  Chest x-ray showed evidence of early pulmonary edema and his BNP was 886.  His EKG showed sinus rhythm with inferior Q waves which seem to be new.  Obtaining history from the patient is extremely difficult.  He denies chest pain but does report shortness of breath.  He is a very poor historian with inability to provide an accurate history.  In addition, he starts singing in the middle of conversation. When I explained to him that there is evidence of myocardial infarction, his reply was that he did not have " a heart attack".  According to the documentation, patient is DNR and DNI according to his mother.   Past Medical History:  Diagnosis Date   Schizophrenia (HCC)    Stroke [facility])     History reviewed. No pertinent surgical history.   Home Medications:  Prior to Admission medications   Medication Sig Start Date End Date Taking? Authorizing Provider  benztropine (COGENTIN) 1 MG tablet Take 1 mg by mouth daily.   Yes [provider]  cholecalciferol (VITAMIN D3) 25 MCG (1000 UNIT) tablet Take 1,000 Units by mouth daily.   Yes [provider]  famotidine (PEPCID) 20 MG  tablet Take 20 mg by mouth 2 (two) times daily as needed for heartburn or indigestion.   Yes [provider]  folic acid (FOLVITE) 1 MG tablet Take 1 mg by mouth daily.   Yes [provider]  gabapentin (NEURONTIN) 300 MG capsule Take 300 mg by mouth 3 (three) times daily.   Yes [provider]  haloperidol (HALDOL) 5 MG tablet Take 5 mg by mouth 3 (three) times daily.   Yes [provider]  LORazepam (ATIVAN) 1 MG tablet Take 1 mg by mouth 3 (three) times daily.   Yes [provider]  Multiple Vitamin (MULTIVITAMIN) tablet Take 1 tablet by mouth daily.   Yes [provider]  paliperidone (INVEGA SUSTENNA) 234 MG/1.5ML SUSY injection Inject 234 mg into the muscle every 28 (twenty-eight) days.   Yes [provider]  thiamine (VITAMIN B-1) 100 MG tablet Take 100 mg by mouth daily.   Yes [provider]  traZODone (DESYREL) 150 MG tablet Take 150 mg by mouth at bedtime.   Yes [provider]    Inpatient Medications: Scheduled Meds:  aspirin  324 mg Oral Once   atorvastatin  40 mg Oral Daily   benztropine  0.5 mg Oral BID   cholecalciferol  1,000 Units Oral Daily   divalproex  500 mg Oral Q12H   folic acid  1 mg Oral Daily   gabapentin  300 mg Oral TID  haloperidol  7 mg Oral TID   Or   haloperidol lactate  5 mg Intramuscular TID   lisinopril  5 mg Oral Daily   LORazepam  1 mg Oral TID   multivitamin with minerals  1 tablet Oral Daily   paliperidone  6 mg Oral Q1400   temazepam  15 mg Oral QHS   thiamine  100 mg Oral Daily   Vitamin D (Ergocalciferol)  50,000 Units Oral Q7 days   Continuous Infusions:  PRN Meds: acetaminophen, diphenhydrAMINE, famotidine, haloperidol lactate, traMADol, ziprasidone  Allergies:    Allergies  Allergen Reactions   Morphine And Related    Nsaids     Social History:   Social History   Socioeconomic History   Marital status: Single    Spouse name: Not on file    Number of children: Not on file   Years of education: Not on file   Highest education level: Not on file  Occupational History   Not on file  Tobacco Use   Smoking status: Unknown   Smokeless tobacco: Not on file  Substance and Sexual Activity   Alcohol use: Not on file   Drug use: Not on file   Sexual activity: Not on file  Other Topics Concern   Not on file  Social History Narrative   Not on file   Social Determinants of Health   Financial Resource Strain: Not on file  Food Insecurity: Not on file  Transportation Needs: Not on file  Physical Activity: Not on file  Stress: Not on file  Social Connections: Not on file  Intimate Partner Violence: Not on file    Family History:   History reviewed. No pertinent family history.   ROS:  Please see the history of present illness.   All other ROS reviewed and negative.     Physical Exam/Data:   Vitals:   11/20/21 1206 11/20/21 1259 11/20/21 1547 11/20/21 1749  BP: 96/70  99/78 98/74  Pulse: 96 (!) 105 94 (!) 109  Resp: (!) 24  20 20   Temp:   98.9 F (37.2 C)   TempSrc:   Oral   SpO2: (!) 87% 95% 99% 97%  Weight:      Height:       No intake or output data in the 24 hours ending 11/20/21 1757 Last 3 Weights 07/20/2021  Weight (lbs) 175 lb  Weight (kg) 79.379 kg     Body mass index is 26.61 kg/m.  General:  Well nourished, well developed, in no acute distress HEENT: normal Neck: no JVD Vascular: No carotid bruits; Distal pulses 2+ bilaterally Cardiac:  normal S1, S2; RRR; no murmur  Lungs:  clear to auscultation bilaterally, no wheezing, rhonchi or rales  Abd: soft, nontender, no hepatomegaly  Ext: no edema Musculoskeletal:  No deformities, BUE and BLE strength normal and equal Skin: warm and dry  Neuro:  CNs 2-12 intact, no focal abnormalities noted Psych: Poor historian with outbursts as well as singing all of a sudden.  EKG:  The EKG was personally reviewed and demonstrates: Sinus rhythm with minimal  inferior ST elevation that does not meet STEMI criteria but with inferior Q waves which seem to be new compared to his prior EKG. Telemetry:  Telemetry was personally reviewed and demonstrates: Sinus rhythm with sinus tachycardia  Relevant CV Studies:   Laboratory Data:  High Sensitivity Troponin:   Recent Labs  Lab 11/20/21 1536  TROPONINIHS 23,273*     Chemistry Recent Labs  Lab  11/20/21 1536  NA 138  K 4.6  CL 106  CO2 26  GLUCOSE 111*  BUN 43*  CREATININE 1.57*  CALCIUM 8.8*  GFRNONAA 50*  ANIONGAP 6    No results for input(s): PROT, ALBUMIN, AST, ALT, ALKPHOS, BILITOT in the last 168 hours. Lipids No results for input(s): CHOL, TRIG, HDL, LABVLDL, LDLCALC, CHOLHDL in the last 168 hours.  Hematology Recent Labs  Lab 11/20/21 1536  WBC 6.4  RBC 3.40*  HGB 10.9*  HCT 32.5*  MCV 95.6  MCH 32.1  MCHC 33.5  RDW 12.8  PLT 169   Thyroid No results for input(s): TSH, FREET4 in the last 168 hours.  BNP Recent Labs  Lab 11/20/21 1538  BNP 886.3*    DDimer No results for input(s): DDIMER in the last 168 hours.   Radiology/Studies:  CT HEAD WO CONTRAST ( )  Result Date: 11/20/2021 CLINICAL DATA:  Altered mental status EXAM: CT HEAD WITHOUT CONTRAST TECHNIQUE: Contiguous axial images were obtained from the base of the skull through the vertex without intravenous contrast. COMPARISON:  08/22/2021 FINDINGS: Brain: There again noted changes of encephalomalacia in the left frontal and parietal lobes consistent with prior infarcts. These changes are stable in appearance from the prior exam. 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 No findings to suggest acute hemorrhage, acute infarction or space-occupying mass lesion are noted. Vascular: No hyperdense vessel or unexpected calcification. Skull: Normal. Negative for fracture or focal lesion. Sinuses/Orbits: No acute finding. Other: None. 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 No acute intracranial abnormality is noted. Electronically Signed   By: Alcide Clever M.D.   On: 11/20/2021 17:44   DG Chest Portable 1 View  Result Date: 11/20/2021 CLINICAL DATA:  Hypoxia EXAM: PORTABLE CHEST 1 VIEW COMPARISON:  08/22/2021 FINDINGS: Mild cardiomegaly. Prominence of the bilateral hilar regions. Atherosclerotic calcification of the aortic knob. Diffuse interstitial opacities bilaterally with slight basilar predominance. No appreciable pleural fluid collection. No pneumothorax. IMPRESSION: 1. Mild cardiomegaly with diffuse interstitial opacities suggesting pulmonary edema. Superimposed atypical/viral infection not excluded. 2. Prominence of the bilateral hilar regions, which may be related to underlying vascular congestion. Adenopathy not excluded. Electronically Signed   By: Duanne Guess D.O.   On: 11/20/2021 14:38     Assessment and Plan:   Non-ST elevation myocardial infarction: It is possible that the patient had an inferior STEMI yesterday and has almost completed his infarct at the present time with Q waves present on his EKG.  I agree with aspirin.  Recommend unfractionated heparin for 48 hours and I also added atorvastatin.  Unfortunately, given his extensive psychiatric history, he does not appear to be a good candidate for invasive cardiac evaluation.  I think DNR and DNI is appropriate and if his overall condition deteriorates, comfort measures might be the most appropriate approach. Acute systolic heart failure: Consider gentle diuresis.  Obtain an echocardiogram.  Blood pressure is too low to add heart failure medications at the present time. 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    For questions or updates, please contact [facility] Please consult [email or web address] for contact info under    Signed, Lorine Bears, MD  11/20/2021 5:57 PM

## 2021-11-20 NOTE — ED Notes (Signed)
Unable to get full set of VS @ this moment. RN notified.

## 2021-11-20 NOTE — ED Notes (Signed)
Pt moving and grunting/ moaning in his sleep.  Pt appears to be dreaming.  NP on unit to observe pt with RN.

## 2021-11-20 NOTE — ED Notes (Signed)
Pt up to bathroom.

## 2021-11-20 NOTE — ED Provider Notes (Signed)
Pt admitted to medicine after prolonged ED boarding stay, after being found to be slightly less responsive/weaker with trop >20,000. No STEMI on EKG. While awaiting bed, pt increasingly combative and agitated. Hospitalist paged but I was asked to emergently evaluate pt as he was hitting staff and attempting to leave.  He had received Haldol 5 mg IM about 30 min prior to my assessment, with minimal effect. Given his recent NSTEMI and now agitation with combativeness, high concern for worsening demand ischemia. Ativan 2 mg given w minimal improvement. Additional dose of haldol 5 mg and benadryl given, due to ongoing escalation and combativeness despite these interventions.  Pt with mild improvement but no longer combative. I have asked Hospitalist to be made aware, and IVC placed by me.  CRITICAL CARE Performed by: Dollene Cleveland   Total critical care time: 35 minutes  Critical care time was exclusive of separately billable procedures and treating other patients.  Critical care was necessary to treat or prevent imminent or life-threatening deterioration.  Critical care was time spent personally by me on the following activities: development of treatment plan with patient and/or surrogate as well as nursing, discussions with consultants, evaluation of patient's response to treatment, examination of patient, obtaining history from patient or surrogate, ordering and performing treatments and interventions, ordering and review of laboratory studies, ordering and review of radiographic studies, pulse oximetry and re-evaluation of patient's condition.    Shaune Pollack, MD 11/20/21 2154

## 2021-11-20 NOTE — Assessment & Plan Note (Addendum)
CXR with mild cardiomegaly with diffuse interstitial opacities suggesting pulm edema - atypical/viral infection not excluded. Negative influenza/covid. This was likely due to NSTEMI. - Resolved.

## 2021-11-20 NOTE — ED Notes (Signed)
VOL/pending placement 

## 2021-11-20 NOTE — ED Notes (Signed)
Pt res

## 2021-11-20 NOTE — ED Notes (Signed)
Pt very agitated and trying to get out of bed. PRN medication will be given

## 2021-11-20 NOTE — ED Notes (Signed)
This RN spoke to [place] pts mother. Updated on plan of care at this time. All questions answered.

## 2021-11-20 NOTE — ED Notes (Signed)
Dr. Kirke Corin with Cardiology at bedside

## 2021-11-20 NOTE — ED Notes (Signed)
Patient resting quietly in room. No noted distress or abnormal behaviors noted. Will continue 15 minute checks and observation by security camera for safety. 

## 2021-11-20 NOTE — ED Provider Notes (Signed)
CRITICAL CARE Performed by: Sharyn Creamer   Total critical care time: 35 minutes  Critical care time was exclusive of separately billable procedures and treating other patients.  Critical care was necessary to treat or prevent imminent or life-threatening deterioration.  Critical care was time spent personally by me on the following activities: development of treatment plan with patient and/or surrogate as well as nursing, discussions with consultants, evaluation of patient's response to treatment, examination of patient, obtaining history from patient or surrogate, ordering and performing treatments and interventions, ordering and review of laboratory studies, ordering and review of radiographic studies, pulse oximetry and re-evaluation of patient's condition.    Sharyn Creamer, MD 11/20/21 [phone]

## 2021-11-20 NOTE — ED Notes (Signed)
EDP at bedside  

## 2021-11-20 NOTE — Assessment & Plan Note (Addendum)
Chronic.  Becoming very agitated and aggressive at times. - Pursuing placement which is complicated. TOC and leadership involved in challenging disposition.

## 2021-11-20 NOTE — H&P (Signed)
History and Physical    Adam [name] DOB: [name] DOA: 07/20/2021  PCP: Pcp, No   Patient coming from: Group Home initially admitted to [facility] ER on 07-20-2021.  I have personally briefly reviewed patient's old medical records in [facility]  CC: elevated troponin HPI: 60 yo WM with hx of schizophrenia and TBI, who was initially admitted to [facility] ER on July 20, 2021. He has spent more than 123 days in the [facility] ED awaiting psychiatric placement. He was initially brought to [facility] ER due to violent outbursts at his group home. He was IVC'd initially.  Due to difficulty in placing him into facility, pt has essentially been living in the [facility] ER.  Today, RN noted that pt was more lethargic. Slightly hypotensive and slightly hypoxic.  Labs performed today showed Troponin of 23,273.  CXR showed pulmonary edema. BNP elevated at 886.  I overheard EDP conversation with pt's mother who has made him a DNR/DNI.  Due to recent MI, pulmonary edema, TRH consulted for admission.  Pt unable to give any ROS or HPI due to psychiatric illness.   ED Course: pt has spent 123 days in [facility] ER. Today, noted to have lethargy, low BP and hypoxia. Troponin elevated to >23K. Pt with new Q waves in EKG since 08-2021.  Review of Systems:  Review of Systems  Unable to perform ROS: Psychiatric disorder   Past Medical History:  Diagnosis Date   Schizophrenia (HCC)    Stroke [facility])     History reviewed. No pertinent surgical history.   has no history on file for tobacco use, alcohol use, and drug use.  Allergies  Allergen Reactions   Morphine And Related    Nsaids     History reviewed. No pertinent family history.  Prior to Admission medications   Medication Sig Start Date End Date Taking? Authorizing Provider  benztropine (COGENTIN) 1 MG tablet Take 1 mg by mouth daily.   Yes [provider]  cholecalciferol (VITAMIN D3) 25 MCG (1000 UNIT) tablet Take 1,000 Units by mouth  daily.   Yes [provider]  famotidine (PEPCID) 20 MG tablet Take 20 mg by mouth 2 (two) times daily as needed for heartburn or indigestion.   Yes [provider]  folic acid (FOLVITE) 1 MG tablet Take 1 mg by mouth daily.   Yes [provider]  gabapentin (NEURONTIN) 300 MG capsule Take 300 mg by mouth 3 (three) times daily.   Yes [provider]  haloperidol (HALDOL) 5 MG tablet Take 5 mg by mouth 3 (three) times daily.   Yes [provider]  LORazepam (ATIVAN) 1 MG tablet Take 1 mg by mouth 3 (three) times daily.   Yes [provider]  Multiple Vitamin (MULTIVITAMIN) tablet Take 1 tablet by mouth daily.   Yes [provider]  paliperidone (INVEGA SUSTENNA) 234 MG/1.5ML SUSY injection Inject 234 mg into the muscle every 28 (twenty-eight) days.   Yes [provider]  thiamine (VITAMIN B-1) 100 MG tablet Take 100 mg by mouth daily.   Yes [provider]  traZODone (DESYREL) 150 MG tablet Take 150 mg by mouth at bedtime.   Yes [provider]    Physical Exam: Vitals:   11/20/21 0929 11/20/21 1206 11/20/21 1259 11/20/21 1547  BP:  96/70  99/78  Pulse:  96 (!) 105 94  Resp: (!) 28 (!) 24  20  Temp:    98.9 F (37.2 C)  TempSrc:  Oral  SpO2:  (!) 87% 95% 99%  Weight:      Height:        Physical Exam Vitals and nursing note reviewed.  Constitutional:      General: He is not in acute distress.    Appearance: He is not toxic-appearing.     Comments: Chronically ill appearing male. Appears older than stated age of 60.  HENT:     Head: Normocephalic and atraumatic.     Nose: Nose normal. No rhinorrhea.  Eyes:     General:        Right eye: No discharge.        Left eye: No discharge.  Cardiovascular:     Rate and Rhythm: Normal rate and regular rhythm.     Heart sounds: Murmur heard.  Pulmonary:     Breath sounds: Rales present.     Comments: Basilar rales. No wheezing Abdominal:      General: Abdomen is flat. Bowel sounds are normal. There is no distension.     Tenderness: There is no abdominal tenderness. There is no guarding.  Musculoskeletal:     Right lower leg: No edema.     Left lower leg: No edema.  Skin:    General: Skin is warm and dry.     Capillary Refill: Capillary refill takes less than 2 seconds.  Neurological:     Mental Status: He is disoriented.     Labs on Admission: I have personally reviewed following labs and imaging studies  Component Ref Range & Units 15:36   Troponin I (High Sensitivity) <18 ng/L 23,273 High Panic        CBC: Recent Labs  Lab 11/20/21 1536  WBC 6.4  NEUTROABS 3.9  HGB 10.9*  HCT 32.5*  MCV 95.6  PLT 169   Basic Metabolic Panel: Recent Labs  Lab 11/20/21 1536  NA 138  K 4.6  CL 106  CO2 26  GLUCOSE 111*  BUN 43*  CREATININE 1.57*  CALCIUM 8.8*   GFR: Estimated Creatinine Clearance: 47.8 mL/min (A) (by C-G formula based on SCr of 1.57 mg/dL (H)). Liver Function Tests: No results for input(s): AST, ALT, ALKPHOS, BILITOT, PROT, ALBUMIN in the last 168 hours. No results for input(s): LIPASE, AMYLASE in the last 168 hours. No results for input(s): AMMONIA in the last 168 hours. Coagulation Profile: No results for input(s): INR, PROTIME in the last 168 hours. Cardiac Enzymes: No results for input(s): CKTOTAL, CKMB, CKMBINDEX, TROPONINI in the last 168 hours. BNP (last 3 results) No results for input(s): PROBNP in the last 8760 hours. HbA1C: No results for input(s): HGBA1C in the last 72 hours. CBG: Recent Labs  Lab 11/20/21 1214 11/20/21 1643  GLUCAP 87 108*   Lipid Profile: No results for input(s): CHOL, HDL, LDLCALC, TRIG, CHOLHDL, LDLDIRECT in the last 72 hours. Thyroid Function Tests: No results for input(s): TSH, T4TOTAL, FREET4, T3FREE, THYROIDAB in the last 72 hours. Anemia Panel: No results for input(s): VITAMINB12, FOLATE, FERRITIN, TIBC, IRON, RETICCTPCT in the last 72 hours. Urine  analysis:    Component Value Date/Time   COLORURINE YELLOW (A) 10/27/2021 1624   APPEARANCEUR CLEAR (A) 10/27/2021 1624   LABSPEC 1.015 10/27/2021 1624   PHURINE 7.0 10/27/2021 1624   GLUCOSEU NEGATIVE 10/27/2021 1624   HGBUR NEGATIVE 10/27/2021 1624   BILIRUBINUR NEGATIVE 10/27/2021 1624   KETONESUR NEGATIVE 10/27/2021 1624   PROTEINUR NEGATIVE 10/27/2021 1624   NITRITE NEGATIVE 10/27/2021 1624   LEUKOCYTESUR NEGATIVE 10/27/2021 1624  Radiological Exams on Admission: I have personally reviewed images DG Chest Portable 1 View  Result Date: 11/20/2021 CLINICAL DATA:  Hypoxia EXAM: PORTABLE CHEST 1 VIEW COMPARISON:  08/22/2021 FINDINGS: Mild cardiomegaly. Prominence of the bilateral hilar regions. Atherosclerotic calcification of the aortic knob. Diffuse interstitial opacities bilaterally with slight basilar predominance. No appreciable pleural fluid collection. No pneumothorax. IMPRESSION: 1. Mild cardiomegaly with diffuse interstitial opacities suggesting pulmonary edema. Superimposed atypical/viral infection not excluded. 2. Prominence of the bilateral hilar regions, which may be related to underlying vascular congestion. Adenopathy not excluded. Electronically Signed   By: Duanne Guess D.O.   On: 11/20/2021 14:38    EKG: I have personally reviewed EKG: NSR, new Q waves in inferior leads since 08-2021. Pt has been living in [facility] ER since 07-20-2021.    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 EDP has consulted cardiology. Check lipid profile. Check echo. Appears he had STEMI vs NSTEMI in the last 12-24 hours. Defer to cardiology if IV heparin gtts should  be started.  Acute pulmonary edema (HCC) Will hold off on IV lasix for now due to SBP in the 90s.  May be the beginning of cardiogenic shock. Will need to monitor pt's respiratory status carefully.  Pt is a DNR/DNI.  Schizophrenia, chronic condition with acute exacerbation (HCC) Chronic.  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  DNR (do not resuscitate)/DNI(Do Not Intubate) EDP has verified pt's DNR/DNI status with his mother([name])  DVT prophylaxis: SCDs Code Status: DNR/DNI(Do NOT Intubate)EDP verified this with pt's mother Cyprus Ayon Family Communication: EDP has discussed case with pt's mother Cyprus Stankus  Disposition Plan: unknown  Consults called: EDP has consulted cardiology  Admission status: Inpatient, Step Down Unit   Carollee Herter, DO [facility] 11/20/2021, 5:28 PM

## 2021-11-20 NOTE — ED Notes (Signed)
Pt given breakfast tray and orange juice. 

## 2021-11-20 NOTE — ED Notes (Signed)
Patient transported to X-ray 

## 2021-11-20 NOTE — Assessment & Plan Note (Addendum)
Psychiatry was following him here due to ongoing safety issues related to patient's behavioral disturbances.  - Continue medication as per psychiatric recommendation - No inpatient psychiatric admission recommended.

## 2021-11-20 NOTE — ED Provider Notes (Signed)
----------------------------------------- °  12:26 PM on 11/20/2021 ----------------------------------------- I was notified by nursing staff that patient has been notably less active today, had not allowed staff to check vital signs since yesterday.  He was noted to have O2 sat of 87% and percent on room air, however on my assessment does not appear to be in any respiratory distress and is actively eating a sandwich.  EKG was performed and shows no evidence of arrhythmia or ischemia, we will also check chest x-ray and labs.  Patient noted to be febrile 3 days ago at 100.4 and I would consider COVID-19 or influenza as well.  We will check testing for COVID-19 and have patient moved to medical room for closer monitoring.  ED ECG REPORT I, Chesley Noon, the attending physician, personally viewed and interpreted this ECG.   Date: 11/20/2021  EKG Time: 11:55  Rate: 96  Rhythm: normal sinus rhythm  Axis: LAD  Intervals:none  ST&T Change: None    Chesley Noon, MD 11/20/21 [phone]

## 2021-11-20 NOTE — ED Notes (Signed)
Charge notified for need of sitter. Sitter placed at bedside

## 2021-11-20 NOTE — ED Notes (Signed)
Pt's IV wrapped in Coban at this time.

## 2021-11-20 NOTE — ED Notes (Signed)
Pt not cooperative with taking medications at this time.

## 2021-11-20 NOTE — Consult Note (Signed)
ANTICOAGULATION CONSULT NOTE - Initial Consult  Pharmacy Consult for heparin Indication: chest pain/ACS  Allergies  Allergen Reactions   Morphine And Related    Nsaids     Patient Measurements: Height: 5\' 8"  (172.7 cm) Weight: 79.4 kg (175 lb) IBW/kg (Calculated) : 68.4 Heparin Dosing Weight: 79.4 kg  Vital Signs: Temp: 98.9 F (37.2 C) (12/19 1547) Temp Source: Oral (12/19 1547) BP: 98/74 (12/19 1749) Pulse Rate: 109 (12/19 1749)  Labs: Recent Labs    11/20/21 1536  HGB 10.9*  HCT 32.5*  PLT 169  CREATININE 1.57*  TROPONINIHS 23,273*    Estimated Creatinine Clearance: 47.8 mL/min (A) (by C-G formula based on SCr of 1.57 mg/dL (H)).   Medical History: Past Medical History:  Diagnosis Date   Schizophrenia (HCC)    Stroke (HCC)     Medications:  (Not in a hospital admission)  Scheduled:   aspirin  324 mg Oral Once   atorvastatin  40 mg Oral Daily   benztropine  0.5 mg Oral BID   cholecalciferol  1,000 Units Oral Daily   divalproex  500 mg Oral Q12H   folic acid  1 mg Oral Daily   gabapentin  300 mg Oral TID   haloperidol  7 mg Oral TID   Or   haloperidol lactate  5 mg Intramuscular TID   lisinopril  5 mg Oral Daily   LORazepam  1 mg Oral TID   multivitamin with minerals  1 tablet Oral Daily   paliperidone  6 mg Oral Q1400   temazepam  15 mg Oral QHS   thiamine  100 mg Oral Daily   Vitamin D (Ergocalciferol)  50,000 Units Oral Q7 days   Infusions:  PRN: acetaminophen, diphenhydrAMINE, famotidine, haloperidol lactate, traMADol, ziprasidone Anti-infectives (From admission, onward)    None       Assessment: Pharmacy consulted to start heparin for ACS. Trop 23K. No DOAC PTA noted.   Goal of Therapy:  Heparin level 0.3-0.7 units/ml Monitor platelets by anticoagulation protocol: Yes   Plan:  Give 4000 units bolus x 1 Start heparin infusion at 950 units/hr Check anti-Xa level in 6 hours and daily while on heparin Continue to monitor H&H and  platelets  11/22/21, PharmD, BCPS 11/20/2021,6:07 PM

## 2021-11-20 NOTE — ED Notes (Signed)
Lunch tray given. 

## 2021-11-20 NOTE — ED Provider Notes (Signed)
Had a couple conversations with the patient's mother reports also being his guardian Mrs. Vue  I updated her on the situation, patient is to be admitted to the hospital cardiology consulting.  CT of the head has been reviewed negative for acute hemorrhage or obvious finding of new stroke.  This was done as a precautionary measure given the patient's mental status and being a poor historian to assure no hemorrhage prior to initiation of salicylates and heparin  Patient's mother reports she does not believe he has an aspirin allergy.  Understanding of the concern and also I discussed with her the recommendations from cardiology consult including concerns for possible non-STEMI or acute MI occurring recently.  Patient will be admitted for further care and management, he is admitted to the hospitalist service Dr. Imogene Burn.  Vitals:   11/20/21 1547 11/20/21 1749  BP: 99/78 98/74  Pulse: 94 (!) 109  Resp: 20 20  Temp: 98.9 F (37.2 C)   SpO2: 99% 97%        Sharyn Creamer, MD 11/20/21 1941

## 2021-11-21 ENCOUNTER — Inpatient Hospital Stay (HOSPITAL_BASED_OUTPATIENT_CLINIC_OR_DEPARTMENT_OTHER)
Admit: 2021-11-21 | Discharge: 2021-11-21 | Disposition: A | Discharge status: Home / Self Care | Payer: Medicare HMO | Attending: Internal Medicine | Admitting: Internal Medicine

## 2021-11-21 ENCOUNTER — Encounter: Payer: Self-pay | Admitting: Family Medicine

## 2021-11-21 DIAGNOSIS — I214 Non-ST elevation (NSTEMI) myocardial infarction: Secondary | ICD-10-CM

## 2021-11-21 DIAGNOSIS — G9341 Metabolic encephalopathy: Secondary | ICD-10-CM | POA: Diagnosis present

## 2021-11-21 DIAGNOSIS — F209 Schizophrenia, unspecified: Secondary | ICD-10-CM | POA: Diagnosis not present

## 2021-11-21 LAB — BASIC METABOLIC PANEL
Anion gap: 8 (ref 5–15)
BUN: 27 mg/dL — ABNORMAL HIGH (ref 8–23)
CO2: 26 mmol/L (ref 22–32)
Calcium: 8.8 mg/dL — ABNORMAL LOW (ref 8.9–10.3)
Chloride: 106 mmol/L (ref 98–111)
Creatinine, Ser: 1.07 mg/dL (ref 0.61–1.24)
GFR, Estimated: >60 mL/min (ref >60)
Glucose, Bld: 93 mg/dL (ref 70–99)
Potassium: 4.4 mmol/L (ref 3.5–5.1)
Sodium: 140 mmol/L (ref 135–145)

## 2021-11-21 LAB — ECHOCARDIOGRAM COMPLETE
AR max vel: 3.27 cm2
AV Area VTI: 4.08 cm2
AV Area mean vel: 3.08 cm2
AV Mean grad: 2 mmHg
AV Peak grad: 3.8 mmHg
Ao pk vel: 0.97 m/s
Area-P 1/2: 8.16 cm2
Height: 68 in
MV VTI: 3.09 cm2
S' Lateral: 3.87 cm
Weight: 2800 oz

## 2021-11-21 LAB — HEPARIN LEVEL (UNFRACTIONATED)
Heparin Unfractionated: 0.14 IU/mL — ABNORMAL LOW (ref 0.30–0.70)
Heparin Unfractionated: 0.11 IU/mL — ABNORMAL LOW (ref 0.30–0.70)

## 2021-11-21 LAB — CBC
HCT: 33.2 % — ABNORMAL LOW (ref 39.0–52.0)
Hemoglobin: 11.4 g/dL — ABNORMAL LOW (ref 13.0–17.0)
MCH: 32 pg (ref 26.0–34.0)
MCHC: 34.3 g/dL (ref 30.0–36.0)
MCV: 93.3 fL (ref 80.0–100.0)
Platelets: 190 10*3/uL (ref 150–400)
RBC: 3.56 MIL/uL — ABNORMAL LOW (ref 4.22–5.81)
RDW: 12.6 % (ref 11.5–15.5)
WBC: 6.2 10*3/uL (ref 4.0–10.5)
nRBC: 0 % (ref 0.0–0.2)

## 2021-11-21 MED ORDER — HEPARIN BOLUS VIA INFUSION
2000.0000 [IU] | Freq: Once | INTRAVENOUS | Status: AC
  Administered 2021-11-21: 21:00:00 2000 [IU] via INTRAVENOUS
  Filled 2021-11-21: qty 2000

## 2021-11-21 MED ORDER — HEPARIN BOLUS VIA INFUSION
2000.0000 [IU] | Freq: Once | INTRAVENOUS | Status: AC
  Administered 2021-11-21: 12:00:00 2000 [IU] via INTRAVENOUS
  Filled 2021-11-21: qty 2000

## 2021-11-21 MED ORDER — LORAZEPAM 2 MG/ML IJ SOLN: 1.0000 mg | Freq: Once | INTRAMUSCULAR | Status: DC

## 2021-11-21 MED ORDER — PERFLUTREN LIPID MICROSPHERE
1.0000 mL | INTRAVENOUS | Status: AC | PRN
  Administered 2021-11-21: 11:00:00 3 mL via INTRAVENOUS
  Filled 2021-11-21: qty 10

## 2021-11-21 NOTE — Progress Notes (Signed)
*  PRELIMINARY RESULTS* Echocardiogram 2D Echocardiogram has been performed.  Adam [name] Adam [name] 11/21/2021, 11:45 AM

## 2021-11-21 NOTE — Progress Notes (Signed)
Patient noted to be agitated at around 8:20PM; walked towards the door, when staff and sitter intervened as patient was unsteady, patient started swinging, kicking staff. Patient assisted back to bed by 4 staff. Ativan 1mg  given w/out any relief; haldol 5mg  IM given without relief. Patient cont to attempt to get out of bed and hitting and kicking staff, screaming "I will kill my wife when I get home". MD notified with agistation, 2mg  IV ativan given pushed slowly. Sitter at bedside. Will continue to monitor.

## 2021-11-21 NOTE — ED Notes (Signed)
Pt is resting while talking in his sleep. NT Adair Laundry is sitting with pt.

## 2021-11-21 NOTE — Hospital Course (Addendum)
60 yo WM with hx of schizophrenia and TBI, who was initially admitted to [facility] ER on July 20, 2021. He spent more than 123 days in the [facility] ED awaiting psychiatric placement.  He was initially brought to [facility] ER due to violent outbursts at his group home.  He was IVC'd initially.  He was noted to be lethargic 12/19, found to have NSTEMI.  Pt was admitted to hospitalist service.  Cardiology consulted who recommended no intervention.   After admission to the medical service patient became severely agitated on the floor.  Pt required to ICU night of 12/24 for precedex gtt due to severe agitation and combative behavior.  PCCM was consulted.  Precedex felt to be ineffective and was turned off.  Pt was transferred back to floor on 1/4.  Hospital course remarkable for agitation, violent outbursts Psychiatry was consulted,making some medication changes.  They do not accept pt to inpatient psych here and recommend continued search for longer term placement.  Senior management involved.  Plan for placement on a group home.

## 2021-11-21 NOTE — Progress Notes (Signed)
PROGRESS NOTE    Adam [name]  FMB:[id number] DOB: 03-20-1961 DOA: 07/20/2021 PCP: Pcp, No  Chief Complaint  Patient presents with   Psychiatric Evaluation    Brief Narrative:  60 yo WM with hx of schizophrenia and TBI, who was initially admitted to [facility] ER on July 20, 2021. He has spent more than 123 days in the [facility] ED awaiting psychiatric placement. He was initially brought to [facility] ER due to violent outbursts at his group home. He was IVC'd initially.  Due to difficulty in placing him into facility, pt has essentially been living in the [facility] ER.  He was noted to be lethargic 12/19, found to have NSTEMI.  Cardiology following, he's been admitted to [facility].      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)   Acute metabolic encephalopathy   DNR (do not resuscitate)/DNI(Do Not Intubate)   * Myocardial infarction [facility]) NSTEMI with elevated troponin (peaked to 23273), EKG with Q waves Appreciate cardiology recs Plan for 48 hrs heparin gtt, aspirin, statin Beta blocker on hold with hypotension Poor invasive candidate with psych hx  Echo is pending read Cards recommending palliative care consideration, will discuss with family  Acute pulmonary edema (HCC) CXR with mild cardiomegaly with diffuse interstitial opacities suggesting pulm edema - atypical/viral infection not excluded Negative influenza/covid Most likely HF with NSTEMI   Acute metabolic encephalopathy Unclear baseline, appears last night hitting staff and attempting to leave Got haldol, benadryl, and ativan last night He's sedated this AM head CT 12/19 with chronic changes of L frontal and parietal infarcts with encephalomalacia and mild volume loss   Delirium precautions Monitor, if worsening or not improving, additional imaging/w/u   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  Schizophrenia, chronic condition with acute exacerbation (HCC) Chronic On haldol, cogentin, depakote, invega, gabapentin, ativan Temazepam Will c/s psych for assistance He was IVC'd overnight  DNR (do not resuscitate)/DNI(Do Not Intubate) EDP has verified pt's DNR/DNI status with his mother([name])     DVT prophylaxis: heparin Code Status: dnr Family Communication: called mom, no answer Disposition:   Status is: Inpatient  Remains inpatient appropriate because: need for cards       Consultants:  cards  Procedures:  none  Antimicrobials:  Anti-infectives (From admission, onward)    None       Subjective: Difficult to understand, denies pain  Objective: Vitals:   11/21/21 1130 11/21/21 1134 11/21/21 1138 11/21/21 1352  BP:  91/64 91/64 102/66  Pulse:  88  81  Resp: 20 18  18   Temp:  98.5 F (36.9 C)  98.4 F (36.9 C)  TempSrc:  Oral  Oral  SpO2:  96%  96%  Weight:      Height:        Intake/Output Summary (Last 24 hours) at 11/21/2021 1540 Last data filed at 11/21/2021 1136 Gross per 24 hour  Intake 680.65 ml  Output --  Net 680.65 ml   Filed Weights   07/20/21 1635  Weight: 79.4 kg    Examination:  General exam: Appears calm and comfortable  Respiratory system: Clear to auscultation. Respiratory effort normal. Cardiovascular system: S1 & S2 heard, RRR.  Gastrointestinal system: Abdomen is nondistended, soft and nontender. Central nervous system: lethargic, difficult  to understand Extremities: no LEE    Data Reviewed: I have personally reviewed following labs and imaging studies  CBC: Recent Labs  Lab 11/20/21 1536  WBC 6.4  NEUTROABS 3.9  HGB 10.9*  HCT 32.5*  MCV 95.6  PLT 169    Basic Metabolic Panel: Recent Labs  Lab 11/20/21 1536  NA  138  K 4.6  CL 106  CO2 26  GLUCOSE 111*  BUN 43*  CREATININE 1.57*  CALCIUM 8.8*    GFR: Estimated Creatinine Clearance: 47.8 mL/min (Indianna Boran) (by C-G formula based on SCr of 1.57 mg/dL (H)).  Liver Function Tests: No results for input(s): AST, ALT, ALKPHOS, BILITOT, PROT, ALBUMIN in the last 168 hours.  CBG: Recent Labs  Lab 11/20/21 1214 11/20/21 1643  GLUCAP 87 108*     Recent Results (from the past 240 hour(s))  Resp Panel by RT-PCR (Flu Kabe Mckoy&B, Covid) Nasopharyngeal Swab     Status: None   Collection Time: 11/17/21  9:57 PM   Specimen: Nasopharyngeal Swab; Nasopharyngeal(NP) swabs in vial transport medium  Result Value Ref Range Status   SARS Coronavirus 2 by RT PCR NEGATIVE NEGATIVE Final    Comment: (NOTE) SARS-CoV-2 target nucleic acids are NOT DETECTED.  The SARS-CoV-2 RNA is generally detectable in upper respiratory specimens during the acute phase of infection. The lowest concentration of SARS-CoV-2 viral copies this assay can detect is 138 copies/mL. Maddax Palinkas negative result does not preclude SARS-Cov-2 infection and should not be used as the sole basis for treatment or other patient management decisions. Rox Mcgriff negative result may occur with  improper specimen collection/handling, submission of specimen other than nasopharyngeal swab, presence of viral mutation(s) within the areas targeted by this assay, and inadequate number of viral copies(<138 copies/mL). Qusay Villada negative result must be combined with clinical observations, patient history, and epidemiological information. The expected result is Negative.  Fact Sheet for Patients:  BloggerCourse.com  Fact Sheet for Healthcare Providers:  SeriousBroker.it  This test is no t yet approved or cleared by the [place] FDA and  has been authorized for detection and/or diagnosis of SARS-CoV-2 by FDA under an Emergency Use Authorization (EUA). This EUA will remain  in effect  (meaning this test can be used) for the duration of the COVID-19 declaration under Section 564(b)(1) of the Act, 21 U.S.C.section 360bbb-3(b)(1), unless the authorization is terminated  or revoked sooner.       Influenza Kelsie Zaborowski by PCR NEGATIVE NEGATIVE Final   Influenza B by PCR NEGATIVE NEGATIVE Final    Comment: (NOTE) The Xpert Xpress SARS-CoV-2/FLU/RSV plus assay is intended as an aid in the diagnosis of influenza from Nasopharyngeal swab specimens and should not be used as Kain Milosevic sole basis for treatment. Nasal washings and aspirates are unacceptable for Xpert Xpress SARS-CoV-2/FLU/RSV testing.  Fact Sheet for Patients: BloggerCourse.com  Fact Sheet for Healthcare Providers: SeriousBroker.it  This test is not yet approved or cleared by the [place] FDA and has been authorized for detection and/or diagnosis of SARS-CoV-2 by FDA under an Emergency Use Authorization (EUA). This EUA will remain in effect (meaning this test can be used) for the duration of the COVID-19 declaration under Section 564(b)(1) of the Act, 21 U.S.C. section 360bbb-3(b)(1), unless the authorization is terminated or revoked.  Performed at [facility], [address] [place], [place] [address]   Resp Panel by RT-PCR (Flu Yurani Fettes&B, Covid) Nasopharyngeal Swab     Status: None   Collection Time: 11/20/21  1:39 PM   Specimen: Nasopharyngeal Swab;  Nasopharyngeal(NP) swabs in vial transport medium  Result Value Ref Range Status   SARS Coronavirus 2 by RT PCR NEGATIVE NEGATIVE Final    Comment: (NOTE) SARS-CoV-2 target nucleic acids are NOT DETECTED.  The SARS-CoV-2 RNA is generally detectable in upper respiratory specimens during the acute phase of infection. The lowest concentration of SARS-CoV-2 viral copies this assay can detect is 138 copies/mL. Paysen Goza negative result does not preclude SARS-Cov-2 infection and should not be used as the sole basis for  treatment or other patient management decisions. Jahbari Repinski negative result may occur with  improper specimen collection/handling, submission of specimen other than nasopharyngeal swab, presence of viral mutation(s) within the areas targeted by this assay, and inadequate number of viral copies(<138 copies/mL). Kalynn Declercq negative result must be combined with clinical observations, patient history, and epidemiological information. The expected result is Negative.  Fact Sheet for Patients:  BloggerCourse.com  Fact Sheet for Healthcare Providers:  SeriousBroker.it  This test is no t yet approved or cleared by the [place] FDA and  has been authorized for detection and/or diagnosis of SARS-CoV-2 by FDA under an Emergency Use Authorization (EUA). This EUA will remain  in effect (meaning this test can be used) for the duration of the COVID-19 declaration under Section 564(b)(1) of the Act, 21 U.S.C.section 360bbb-3(b)(1), unless the authorization is terminated  or revoked sooner.       Influenza Jerre Diguglielmo by PCR NEGATIVE NEGATIVE Final   Influenza B by PCR NEGATIVE NEGATIVE Final    Comment: (NOTE) The Xpert Xpress SARS-CoV-2/FLU/RSV plus assay is intended as an aid in the diagnosis of influenza from Nasopharyngeal swab specimens and should not be used as Maebelle Sulton sole basis for treatment. Nasal washings and aspirates are unacceptable for Xpert Xpress SARS-CoV-2/FLU/RSV testing.  Fact Sheet for Patients: BloggerCourse.com  Fact Sheet for Healthcare Providers: SeriousBroker.it  This test is not yet approved or cleared by the [place] FDA and has been authorized for detection and/or diagnosis of SARS-CoV-2 by FDA under an Emergency Use Authorization (EUA). This EUA will remain in effect (meaning this test can be used) for the duration of the COVID-19 declaration under Section 564(b)(1) of the Act, 21  U.S.C. section 360bbb-3(b)(1), unless the authorization is terminated or revoked.  Performed at [facility], [address], [place], [place] [address]          Radiology Studies: CT HEAD WO CONTRAST ( )  Result Date: 11/20/2021 CLINICAL DATA:  Altered mental status EXAM: CT HEAD WITHOUT CONTRAST TECHNIQUE: Contiguous axial images were obtained from the base of the skull through the vertex without intravenous contrast. COMPARISON:  08/22/2021 FINDINGS: Brain: There again noted changes of encephalomalacia in the left frontal and parietal lobes consistent with prior infarcts. These changes are stable in appearance from the prior exam. 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 No findings to suggest acute hemorrhage, acute infarction or space-occupying mass lesion are noted. Vascular: No hyperdense vessel or unexpected calcification. Skull: Normal. Negative for fracture or focal lesion. Sinuses/Orbits: No acute finding. Other: None. 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 No acute intracranial abnormality is noted. Electronically Signed   By: Alcide Clever M.D.   On: 11/20/2021 17:44   DG Chest Portable 1 View  Result Date: 11/20/2021 CLINICAL DATA:  Hypoxia EXAM: PORTABLE CHEST 1 VIEW COMPARISON:  08/22/2021 FINDINGS: Mild cardiomegaly. Prominence of the bilateral hilar regions. Atherosclerotic calcification of the aortic knob. Diffuse interstitial opacities bilaterally with slight basilar  predominance. No appreciable pleural fluid collection. No pneumothorax. IMPRESSION: 1. Mild cardiomegaly with diffuse interstitial opacities suggesting pulmonary edema. Superimposed atypical/viral infection not excluded. 2. Prominence of the bilateral hilar regions, which may be related to underlying vascular congestion. Adenopathy not excluded. Electronically Signed   By: Duanne Guess D.O.   On: 11/20/2021 14:38         Scheduled Meds:  aspirin EC  81 mg Oral Daily   atorvastatin  40 mg Oral Daily   benztropine  0.5 mg Oral BID   divalproex  500 mg Oral Q12H   gabapentin  300 mg Oral TID   haloperidol  7 mg Oral TID   Or   haloperidol lactate  5 mg Intramuscular TID   LORazepam  2 mg Intravenous Once   LORazepam  1 mg Oral TID   multivitamin with minerals  1 tablet Oral Daily   paliperidone  6 mg Oral Q1400   temazepam  15 mg Oral QHS   thiamine  100 mg Oral Daily   Continuous Infusions:  heparin 1,200 Units/hr (11/21/21 1137)     LOS: 1 day    Time spent: over 30 min    Lacretia Nicks, MD [facility]   To contact the attending provider between 7A-7P or the covering provider during after hours 7P-7A, please log into the web site [email or web address] and access using universal [place] password for that web site. If you do not have the password, please call the hospital operator.  11/21/2021, 3:40 PM

## 2021-11-21 NOTE — Progress Notes (Signed)
ANTICOAGULATION CONSULT NOTE - Follow up Consult  Pharmacy Consult for heparin Indication: chest pain/ACS  Allergies  Allergen Reactions   Morphine And Related    Nsaids     Patient Measurements: Height: 5\' 8"  (172.7 cm) Weight: 79.4 kg (175 lb) IBW/kg (Calculated) : 68.4 Heparin Dosing Weight: 79.4 kg  Vital Signs: Temp: 98 F (36.7 C) (12/20 1942) Temp Source: Oral (12/20 1559) BP: 108/76 (12/20 1942) Pulse Rate: 101 (12/20 1942)  Labs: Recent Labs    11/20/21 1536 11/20/21 1739 11/21/21 0931 11/21/21 1823  HGB 10.9*  --   --  11.4*  HCT 32.5*  --   --  33.2*  PLT 169  --   --  190  HEPARINUNFRC  --   --  0.14* 0.11*  CREATININE 1.57*  --   --  1.07  TROPONINIHS 23,273* 22,417*  --   --     Estimated Creatinine Clearance: 70.1 mL/min (by C-G formula based on SCr of 1.07 mg/dL).  Medical History: Past Medical History:  Diagnosis Date   Schizophrenia (HCC)    Stroke (HCC)     Medications:  Medications Prior to Admission  Medication Sig Dispense Refill Last Dose   benztropine (COGENTIN) 1 MG tablet Take 1 mg by mouth daily.   07/20/2021 at 0800   cholecalciferol (VITAMIN D3) 25 MCG (1000 UNIT) tablet Take 1,000 Units by mouth daily.   07/20/2021 at 0800   famotidine (PEPCID) 20 MG tablet Take 20 mg by mouth 2 (two) times daily as needed for heartburn or indigestion.      folic acid (FOLVITE) 1 MG tablet Take 1 mg by mouth daily.   07/20/2021 at 0800   gabapentin (NEURONTIN) 300 MG capsule Take 300 mg by mouth 3 (three) times daily.   07/20/2021 at 1400   haloperidol (HALDOL) 5 MG tablet Take 5 mg by mouth 3 (three) times daily.   07/20/2021 at 1400   LORazepam (ATIVAN) 1 MG tablet Take 1 mg by mouth 3 (three) times daily.   07/20/2021 at 1400   Multiple Vitamin (MULTIVITAMIN) tablet Take 1 tablet by mouth daily.   07/20/2021 at 0800   paliperidone (INVEGA SUSTENNA) 234 MG/1.5ML SUSY injection Inject 234 mg into the muscle every 28 (twenty-eight) days.   07/06/2021 at  0800   thiamine (VITAMIN B-1) 100 MG tablet Take 100 mg by mouth daily.   07/19/2021 at 0800   traZODone (DESYREL) 150 MG tablet Take 150 mg by mouth at bedtime.   07/19/2021 at 2000   Scheduled:   aspirin EC  81 mg Oral Daily   atorvastatin  40 mg Oral Daily   benztropine  0.5 mg Oral BID   divalproex  500 mg Oral Q12H   gabapentin  300 mg Oral TID   haloperidol  7 mg Oral TID   Or   haloperidol lactate  5 mg Intramuscular TID   LORazepam  2 mg Intravenous Once   LORazepam  1 mg Oral TID   multivitamin with minerals  1 tablet Oral Daily   paliperidone  6 mg Oral Q1400   temazepam  15 mg Oral QHS   thiamine  100 mg Oral Daily   Infusions:   heparin 1,200 Units/hr (11/21/21 1811)   PRN: acetaminophen, famotidine, haloperidol lactate, haloperidol lactate, LORazepam, nitroGLYCERIN, ondansetron (ZOFRAN) IV, traMADol Anti-infectives (From admission, onward)    None      Assessment: Pharmacy consulted to start heparin for ACS. Trop 23K. No DOAC PTA noted.   Goal  of Therapy:  Heparin level 0.3-0.7 units/ml Monitor platelets by anticoagulation protocol: Yes   Date/time  Level  Comments 12/20@0931   HL 0.14 Subthera 950un/hr 12/20@1823   HL 0.11 Subthera @1200  un/hr  Plan:  *Heparin level sub-therapeutic* Give 2000 units bolus x 1 Increase heparin infusion rate to 1500 units/hr Check HL level in 6 hours after rate change  Continue to monitor H&H and platelets with daily CBC  , PharmD Pharmacy Resident  11/21/2021 7:46 PM

## 2021-11-21 NOTE — Progress Notes (Signed)
ANTICOAGULATION CONSULT NOTE - Follow up Consult  Pharmacy Consult for heparin Indication: chest pain/ACS  Allergies  Allergen Reactions   Morphine And Related    Nsaids     Patient Measurements: Height: 5\' 8"  (172.7 cm) Weight: 79.4 kg (175 lb) IBW/kg (Calculated) : 68.4 Heparin Dosing Weight: 79.4 kg  Vital Signs: Temp: 98.3 F (36.8 C) (12/20 0907) Temp Source: Oral (12/20 0907) BP: 127/75 (12/20 0907) Pulse Rate: 97 (12/20 0907)  Labs: Recent Labs    11/20/21 1536 11/20/21 1739 11/21/21 0931  HGB 10.9*  --   --   HCT 32.5*  --   --   PLT 169  --   --   HEPARINUNFRC  --   --  0.14*  CREATININE 1.57*  --   --   TROPONINIHS 23,273* 22,417*  --     Estimated Creatinine Clearance: 47.8 mL/min (A) (by C-G formula based on SCr of 1.57 mg/dL (H)).  Medical History: Past Medical History:  Diagnosis Date   Schizophrenia (HCC)    Stroke (HCC)     Medications:  (Not in a hospital admission) Scheduled:   aspirin EC  81 mg Oral Daily   atorvastatin  40 mg Oral Daily   benztropine  0.5 mg Oral BID   divalproex  500 mg Oral Q12H   gabapentin  300 mg Oral TID   haloperidol  7 mg Oral TID   Or   haloperidol lactate  5 mg Intramuscular TID   LORazepam  2 mg Intravenous Once   LORazepam  1 mg Oral TID   multivitamin with minerals  1 tablet Oral Daily   paliperidone  6 mg Oral Q1400   temazepam  15 mg Oral QHS   thiamine  100 mg Oral Daily   Infusions:   heparin 950 Units/hr (11/21/21 0605)   PRN: acetaminophen, famotidine, haloperidol lactate, haloperidol lactate, LORazepam, nitroGLYCERIN, ondansetron (ZOFRAN) IV, traMADol Anti-infectives (From admission, onward)    None      Assessment: Pharmacy consulted to start heparin for ACS. Trop 23K. No DOAC PTA noted.   Goal of Therapy:  Heparin level 0.3-0.7 units/ml Monitor platelets by anticoagulation protocol: Yes   Date/time  Level  Comments 12/20@0931   HL 0.14 @ 960ml/hr  Plan:  *Heparin level  sub-therapeutic* Give 2000 units bolus x 1 Increase heparin infusion rate to 1200 units/hr Check anti-Xa level in 6 hours and daily while on heparin Continue to monitor H&H and platelets  Raelynne Ludwick Rodriguez-Guzman PharmD, BCPS 11/21/2021 11:13 AM

## 2021-11-21 NOTE — Progress Notes (Signed)
Patient arrived to  unit with sitter and mother. Belongings secured out of patients reach within room. IV wrapped in ED and still present on admit to floor. Hep gtt at 33ml/hr. Patient verbally aggressive without physical inclinations at this time. Mother updated at bedside about patient's plan of care.

## 2021-11-21 NOTE — TOC Progression Note (Signed)
Transition of Care [facility]) - Progression Note    Patient Details  Name: Worthington Cruzan MRN: [id number] Date of Birth: [birth year]  [facility]) CM/SW Contact  Allayne Butcher, RN Phone Number: 11/21/2021, 8:57 AM  Clinical Narrative:    Patient being admitted to the hospital for MI.    RNCM updated on placement yesterday by [name] with [facility].  [name] is working on  "waiting for [employer] staffing to discuss possible [facility] funding for [employer] and also the question of what we can offer [facility] in the way a waiver to the normal 60 day discharge policy (ie having to give 60 days notice when submitting a plan to discharge one of their residents) if they accept [name]. "    Expected Discharge Plan: Group Home Barriers to Discharge: Continued Medical Work up  Expected Discharge Plan and Services Expected Discharge Plan: Group Home In-house Referral: Clinical Social Work     Living arrangements for the past 2 months: Group Home                                       Social Determinants of Health (SDOH) Interventions    Readmission Risk Interventions No flowsheet data found.

## 2021-11-21 NOTE — Progress Notes (Signed)
°   11/21/21 1700  Clinical Encounter Type  Visited With Patient and family together;Health care provider  Visit Type Follow-up;Spiritual support;Social support  Referral From Nurse  Consult/Referral To Chaplain  Spiritual Encounters  Spiritual Needs Emotional   Chaplain Burris received notification from [place], NP that Pt had medical issues. Came to unit to offer support to Pt and mother. Also able to offer supportive conversation regarding code status. Will continue to offer support for this family and care team as they address unique Pt needs. Will continue to follow.

## 2021-11-21 NOTE — ED Notes (Signed)
Pt resting in bed. Appears to be sleeping at this time. Chest is rising and falling symmetrically. No acute distress noted. Will continue to monitor.  °Sitter remains at bedside. °

## 2021-11-21 NOTE — ED Notes (Signed)
Pt resting in bed. Appears to be sleeping at this time. Chest is rising and falling symmetrically. No acute distress noted.  Sitter remains at bedside  

## 2021-11-21 NOTE — ED Notes (Signed)
Pt refusing to keep EKG monitors on after multiple attempts of encouraging to do so.

## 2021-11-21 NOTE — Assessment & Plan Note (Addendum)
Currently at baseline. Head  CT f/u doesnot show any acute findings - Delirium precautions - Psych medications per psychiatry's orders

## 2021-11-21 NOTE — ED Notes (Signed)
Mother updated on pt status.

## 2021-11-21 NOTE — Progress Notes (Signed)
Progress Note  Patient Name: Adam [name] Date of Encounter: 11/21/2021  Primary Cardiologist: Lorine Bears, MD  Subjective   Pt responds "no" to all questions - Denies c/p or sob.  Inpatient Medications    Scheduled Meds:  aspirin EC  81 mg Oral Daily   atorvastatin  40 mg Oral Daily   benztropine  0.5 mg Oral BID   divalproex  500 mg Oral Q12H   gabapentin  300 mg Oral TID   haloperidol  7 mg Oral TID   Or   haloperidol lactate  5 mg Intramuscular TID   LORazepam  2 mg Intravenous Once   LORazepam  1 mg Oral TID   multivitamin with minerals  1 tablet Oral Daily   paliperidone  6 mg Oral Q1400   temazepam  15 mg Oral QHS   thiamine  100 mg Oral Daily   Continuous Infusions:  heparin 950 Units/hr (11/21/21 0605)   PRN Meds: acetaminophen, famotidine, haloperidol lactate, haloperidol lactate, LORazepam, nitroGLYCERIN, ondansetron (ZOFRAN) IV, traMADol   Vital Signs    Vitals:   11/21/21 0647 11/21/21 0648 11/21/21 0649 11/21/21 0907  BP: (!) 158/90 (!) 158/90 (!) 158/90 127/75  Pulse:  100 100 97  Resp:  19 15 16   Temp:    98.3 F (36.8 C)  TempSrc:    Oral  SpO2:  95% 95% 97%  Weight:      Height:        Intake/Output Summary (Last 24 hours) at 11/21/2021 0918 Last data filed at 11/21/2021 11/23/2021 Gross per 24 hour  Intake 630.49 ml  Output --  Net 630.49 ml   Filed Weights   07/20/21 1635  Weight: 79.4 kg    Physical Exam   GEN: Well nourished, well developed, in no acute distress.  HEENT: Grossly normal.  Neck: Supple, no JVD, carotid bruits, or masses. Cardiac: RRR, no murmurs, rubs, or gallops. No clubbing, cyanosis, edema.  Radials 2+, DP/PT 2+ and equal bilaterally.  Respiratory:  Respirations regular and unlabored, clear to auscultation bilaterally. GI: Soft, nontender, nondistended, BS + x 4. MS: no deformity or atrophy. Skin: warm and dry, no rash. Neuro:  Strength and sensation are intact. Psych: Unwilling to answer  orientation-based questions Normal affect.  Labs    Chemistry Recent Labs  Lab 11/20/21 1536  NA 138  K 4.6  CL 106  CO2 26  GLUCOSE 111*  BUN 43*  CREATININE 1.57*  CALCIUM 8.8*  GFRNONAA 50*  ANIONGAP 6     Hematology Recent Labs  Lab 11/20/21 1536  WBC 6.4  RBC 3.40*  HGB 10.9*  HCT 32.5*  MCV 95.6  MCH 32.1  MCHC 33.5  RDW 12.8  PLT 169    Cardiac Enzymes  Recent Labs  Lab 11/20/21 1536 11/20/21 1739  TROPONINIHS 23,273* 22,417*      BNP Recent Labs  Lab 11/20/21 1538  BNP 886.3*       Lipids  Lab Results  Component Value Date   CHOL 167 11/20/2021   HDL 26 (L) 11/20/2021   LDLCALC 120 (H) 11/20/2021   TRIG 107 11/20/2021   CHOLHDL 6.4 11/20/2021    Radiology    CT HEAD WO CONTRAST (11/22/2021)  Result Date: 11/20/2021 CLINICAL DATA:  Altered mental status EXAM: CT HEAD WITHOUT CONTRAST TECHNIQUE: Contiguous axial images were obtained from the base of the skull through the vertex without intravenous contrast. COMPARISON:  08/22/2021 FINDINGS: Brain: There again noted changes of encephalomalacia in the left  frontal and parietal lobes consistent with prior infarcts. These changes are stable in appearance from the prior exam. 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 No findings to suggest acute hemorrhage, acute infarction or space-occupying mass lesion are noted. Vascular: No hyperdense vessel or unexpected calcification. Skull: Normal. Negative for fracture or focal lesion. Sinuses/Orbits: No acute finding. Other: None. 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 No acute intracranial abnormality is noted. Electronically Signed   By: Alcide Clever M.D.   On: 11/20/2021 17:44   DG Chest Portable 1 View  Result Date: 11/20/2021 CLINICAL DATA:  Hypoxia EXAM: PORTABLE CHEST 1 VIEW COMPARISON:  08/22/2021 FINDINGS: Mild cardiomegaly. Prominence of the bilateral hilar regions.  Atherosclerotic calcification of the aortic knob. Diffuse interstitial opacities bilaterally with slight basilar predominance. No appreciable pleural fluid collection. No pneumothorax. IMPRESSION: 1. Mild cardiomegaly with diffuse interstitial opacities suggesting pulmonary edema. Superimposed atypical/viral infection not excluded. 2. Prominence of the bilateral hilar regions, which may be related to underlying vascular congestion. Adenopathy not excluded. Electronically Signed   By: Duanne Guess D.O.   On: 11/20/2021 14:38    Telemetry    rsr - Personally Reviewed  ECG    11/21/2021 0731: Sinus tachycardia, 101, inferolateral infarcts - Personally Reviewed  Cardiac Studies   Echo pending  Patient Profile     60 y.o. male with a hx of schizophrenia and TBI, who has been in the [facility] ED since 07/2021 while awaiting psychiatric placement, who was noted to be more lethargic w/ hypoxia and hypotension on 12/19, prompting ECG showing inferolateral Q waves and HsTrops of 29574  22417.  Assessment & Plan    1.  NSTEMI:  Pt noted to be more lethargic 12/29 prompting ECG and HsTrops.  ECG w/ inferolateral Q waves and HsTrops elevated @ [id number]  [id number].  He has been hemodynamically stable overnight and remains on heparin w/ plan for 48hrs of therapy.  He denies chest pain/dyspnea at this time.  Cont asa/statin.  BPs higher earlier this AM, but most recent = 91/64, just preventing use of ? blocker.  As previously noted, his psych history makes him a poor candidate for invasive eval.  Await echo.  Consider palliative care.  2.  Acute heart failure:  BNP 886 on 12/19.  Lying flat comfortably this AM.  Appears euvolemic on exam.  Await echo.   3. Schizophrenia/TBI:  per primary team.  Signed, Nicolasa Ducking, NP  11/21/2021, 9:18 AM    For questions or updates, please contact   Please consult [email or web address] for contact info under Cardiology/STEMI.

## 2021-11-22 DIAGNOSIS — I219 Acute myocardial infarction, unspecified: Secondary | ICD-10-CM | POA: Diagnosis not present

## 2021-11-22 LAB — MAGNESIUM: Magnesium: 2.1 mg/dL (ref 1.7–2.4)

## 2021-11-22 LAB — CBC
HCT: 31.4 % — ABNORMAL LOW (ref 39.0–52.0)
Hemoglobin: 10.8 g/dL — ABNORMAL LOW (ref 13.0–17.0)
MCH: 31.8 pg (ref 26.0–34.0)
MCHC: 34.4 g/dL (ref 30.0–36.0)
MCV: 92.4 fL (ref 80.0–100.0)
Platelets: 195 10*3/uL (ref 150–400)
RBC: 3.4 MIL/uL — ABNORMAL LOW (ref 4.22–5.81)
RDW: 13 % (ref 11.5–15.5)
WBC: 5 10*3/uL (ref 4.0–10.5)
nRBC: 0 % (ref 0.0–0.2)

## 2021-11-22 LAB — COMPREHENSIVE METABOLIC PANEL
ALT: 103 U/L — ABNORMAL HIGH (ref 0–44)
AST: 84 U/L — ABNORMAL HIGH (ref 15–41)
Albumin: 2.5 g/dL — ABNORMAL LOW (ref 3.5–5.0)
Alkaline Phosphatase: 73 U/L (ref 38–126)
Anion gap: 5 (ref 5–15)
BUN: 24 mg/dL — ABNORMAL HIGH (ref 8–23)
CO2: 25 mmol/L (ref 22–32)
Calcium: 8.2 mg/dL — ABNORMAL LOW (ref 8.9–10.3)
Chloride: 110 mmol/L (ref 98–111)
Creatinine, Ser: 1.06 mg/dL (ref 0.61–1.24)
GFR, Estimated: >60 mL/min (ref >60)
Glucose, Bld: 128 mg/dL — ABNORMAL HIGH (ref 70–99)
Potassium: 4.2 mmol/L (ref 3.5–5.1)
Sodium: 140 mmol/L (ref 135–145)
Total Bilirubin: 0.5 mg/dL (ref 0.3–1.2)
Total Protein: 5.8 g/dL — ABNORMAL LOW (ref 6.5–8.1)

## 2021-11-22 LAB — HEPARIN LEVEL (UNFRACTIONATED)
Heparin Unfractionated: 0.14 IU/mL — ABNORMAL LOW (ref 0.30–0.70)
Heparin Unfractionated: 0.3 IU/mL (ref 0.30–0.70)

## 2021-11-22 LAB — PHOSPHORUS: Phosphorus: 3.7 mg/dL (ref 2.5–4.6)

## 2021-11-22 MED ORDER — LORAZEPAM 2 MG/ML IJ SOLN
2.0000 mg | Freq: Once | INTRAMUSCULAR | Status: AC
  Administered 2021-11-22: 01:00:00 2 mg via INTRAVENOUS
  Filled 2021-11-22: qty 1

## 2021-11-22 MED ORDER — HALOPERIDOL LACTATE 5 MG/ML IJ SOLN
5.0000 mg | Freq: Four times a day (QID) | INTRAMUSCULAR | Status: DC | PRN
  Administered 2021-11-26 – 2021-11-27 (×2): 5 mg via INTRAVENOUS
  Filled 2021-11-22: qty 1

## 2021-11-22 MED ORDER — HALOPERIDOL LACTATE 5 MG/ML IJ SOLN
5.0000 mg | Freq: Four times a day (QID) | INTRAMUSCULAR | Status: DC | PRN
  Administered 2021-11-22 – 2021-11-25 (×3): 5 mg via INTRAMUSCULAR
  Filled 2021-11-22 (×3): qty 1

## 2021-11-22 MED ORDER — HEPARIN BOLUS VIA INFUSION
2300.0000 [IU] | Freq: Once | INTRAVENOUS | Status: AC
  Administered 2021-11-22: 07:00:00 2300 [IU] via INTRAVENOUS
  Filled 2021-11-22: qty 2300
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
## 2021-11-22 NOTE — Progress Notes (Signed)
ANTICOAGULATION CONSULT NOTE - Follow up Consult  Pharmacy Consult for heparin Indication: chest pain/ACS  Allergies  Allergen Reactions   Morphine And Related    Nsaids     Patient Measurements: Height: 5\' 8"  (172.7 cm) Weight: 79.4 kg (175 lb) IBW/kg (Calculated) : 68.4 Heparin Dosing Weight: 79.4 kg  Vital Signs: BP: 103/70 (12/21 1054) Pulse Rate: 101 (12/21 1054)  Labs: Recent Labs    11/20/21 1536 11/20/21 1739 11/21/21 0931 11/21/21 1823 11/22/21 0445 11/22/21 1240  HGB 10.9*  --   --  11.4*  --  10.8*  HCT 32.5*  --   --  33.2*  --  31.4*  PLT 169  --   --  190  --  195  HEPARINUNFRC  --   --    < > 0.11* 0.14* 0.30  CREATININE 1.57*  --   --  1.07 1.06  --   TROPONINIHS 23,273* 22,417*  --   --   --   --    < > = values in this interval not displayed.    Estimated Creatinine Clearance: 70.8 mL/min (by C-G formula based on SCr of 1.06 mg/dL).  Medical History: Past Medical History:  Diagnosis Date   Schizophrenia (HCC)    Stroke (HCC)     Medications:  Medications Prior to Admission  Medication Sig Dispense Refill Last Dose   benztropine (COGENTIN) 1 MG tablet Take 1 mg by mouth daily.   07/20/2021 at 0800   cholecalciferol (VITAMIN D3) 25 MCG (1000 UNIT) tablet Take 1,000 Units by mouth daily.   07/20/2021 at 0800   famotidine (PEPCID) 20 MG tablet Take 20 mg by mouth 2 (two) times daily as needed for heartburn or indigestion.      folic acid (FOLVITE) 1 MG tablet Take 1 mg by mouth daily.   07/20/2021 at 0800   gabapentin (NEURONTIN) 300 MG capsule Take 300 mg by mouth 3 (three) times daily.   07/20/2021 at 1400   haloperidol (HALDOL) 5 MG tablet Take 5 mg by mouth 3 (three) times daily.   07/20/2021 at 1400   LORazepam (ATIVAN) 1 MG tablet Take 1 mg by mouth 3 (three) times daily.   07/20/2021 at 1400   Multiple Vitamin (MULTIVITAMIN) tablet Take 1 tablet by mouth daily.   07/20/2021 at 0800   paliperidone (INVEGA SUSTENNA) 234 MG/1.5ML SUSY injection  Inject 234 mg into the muscle every 28 (twenty-eight) days.   07/06/2021 at 0800   thiamine (VITAMIN B-1) 100 MG tablet Take 100 mg by mouth daily.   07/19/2021 at 0800   traZODone (DESYREL) 150 MG tablet Take 150 mg by mouth at bedtime.   07/19/2021 at 2000   Scheduled:   aspirin EC  81 mg Oral Daily   atorvastatin  40 mg Oral Daily   benztropine  0.5 mg Oral BID   divalproex  500 mg Oral Q12H   gabapentin  300 mg Oral TID   haloperidol  7 mg Oral TID   Or   haloperidol lactate  5 mg Intramuscular TID   LORazepam  1 mg Oral TID   multivitamin with minerals  1 tablet Oral Daily   paliperidone  6 mg Oral Q1400   temazepam  15 mg Oral QHS   thiamine  100 mg Oral Daily   Infusions:   heparin 1,800 Units/hr (11/22/21 1041)   PRN: acetaminophen, famotidine, haloperidol lactate **OR** haloperidol lactate, LORazepam, nitroGLYCERIN, ondansetron (ZOFRAN) IV, traMADol Anti-infectives (From admission, onward)  None      Assessment: Pharmacy consulted to start heparin for ACS. Trop 23K. No DOAC PTA noted.   Goal of Therapy:  Heparin level 0.3-0.7 units/ml Monitor platelets by anticoagulation protocol: Yes   Date/time  Level  Comments 12/20@0931   HL 0.14 Subthera 950un/hr 12/20@1823   HL 0.11 Subthera @1200  un/hr 12/20@0445   HL 0.14 Subthera @1500  un/hr *drawn late due to lab being cancelled by lab personnel as pt was designated "discharged" on their end; confirmed with nurse no issues with heparin infusion/line 12/21 1309  HL 0.3  Therapeutic.   Plan:  Heparin level is therapeutic. Will continue heparin infusion at 1800 units/hr. Plan to continue heparin for 48 hours per cards. Heparin to stop today at 1829. No levels ordered.   , PharmD Clinical Pharmacist 11/22/2021 1:20 PM

## 2021-11-22 NOTE — Consult Note (Signed)
°  Patient is well known to psychiatry, as he went to medical from the psychiatry [facility]) unit. Patient has prns on his medication list for agitation. Writer attempted to see patient, and he was quiet and resting at the time. Will continue to make attempts. Of note to nursing staff, patient enjoys music: "Derrel Nip, Tawni Carnes." He will repeat his name, asks for phone-- "likes to call his mother. Likes ice cream, chocolate milk, grape juice, Icees. He used to play in a rock band.

## 2021-11-22 NOTE — Progress Notes (Addendum)
ANTICOAGULATION CONSULT NOTE - Follow up Consult  Pharmacy Consult for heparin Indication: chest pain/ACS  Allergies  Allergen Reactions   Morphine And Related    Nsaids     Patient Measurements: Height: 5\' 8"  (172.7 cm) Weight: 79.4 kg (175 lb) IBW/kg (Calculated) : 68.4 Heparin Dosing Weight: 79.4 kg  Vital Signs: Temp: 98 F (36.7 C) (12/20 1942) Temp Source: Oral (12/20 1942) BP: 108/76 (12/20 1942) Pulse Rate: 101 (12/20 1942)  Labs: Recent Labs    11/20/21 1536 11/20/21 1739 11/21/21 0931 11/21/21 1823 11/22/21 0445  HGB 10.9*  --   --  11.4*  --   HCT 32.5*  --   --  33.2*  --   PLT 169  --   --  190  --   HEPARINUNFRC  --   --  0.14* 0.11* 0.14*  CREATININE 1.57*  --   --  1.07 1.06  TROPONINIHS 23,273* 22,417*  --   --   --     Estimated Creatinine Clearance: 70.8 mL/min (by C-G formula based on SCr of 1.06 mg/dL).  Medical History: Past Medical History:  Diagnosis Date   Schizophrenia (HCC)    Stroke (HCC)     Medications:  Medications Prior to Admission  Medication Sig Dispense Refill Last Dose   benztropine (COGENTIN) 1 MG tablet Take 1 mg by mouth daily.   07/20/2021 at 0800   cholecalciferol (VITAMIN D3) 25 MCG (1000 UNIT) tablet Take 1,000 Units by mouth daily.   07/20/2021 at 0800   famotidine (PEPCID) 20 MG tablet Take 20 mg by mouth 2 (two) times daily as needed for heartburn or indigestion.      folic acid (FOLVITE) 1 MG tablet Take 1 mg by mouth daily.   07/20/2021 at 0800   gabapentin (NEURONTIN) 300 MG capsule Take 300 mg by mouth 3 (three) times daily.   07/20/2021 at 1400   haloperidol (HALDOL) 5 MG tablet Take 5 mg by mouth 3 (three) times daily.   07/20/2021 at 1400   LORazepam (ATIVAN) 1 MG tablet Take 1 mg by mouth 3 (three) times daily.   07/20/2021 at 1400   Multiple Vitamin (MULTIVITAMIN) tablet Take 1 tablet by mouth daily.   07/20/2021 at 0800   paliperidone (INVEGA SUSTENNA) 234 MG/1.5ML SUSY injection Inject 234 mg into the  muscle every 28 (twenty-eight) days.   07/06/2021 at 0800   thiamine (VITAMIN B-1) 100 MG tablet Take 100 mg by mouth daily.   07/19/2021 at 0800   traZODone (DESYREL) 150 MG tablet Take 150 mg by mouth at bedtime.   07/19/2021 at 2000   Scheduled:   aspirin EC  81 mg Oral Daily   atorvastatin  40 mg Oral Daily   benztropine  0.5 mg Oral BID   divalproex  500 mg Oral Q12H   gabapentin  300 mg Oral TID   haloperidol  7 mg Oral TID   Or   haloperidol lactate  5 mg Intramuscular TID   LORazepam  1 mg Oral TID   multivitamin with minerals  1 tablet Oral Daily   paliperidone  6 mg Oral Q1400   temazepam  15 mg Oral QHS   thiamine  100 mg Oral Daily   Infusions:   heparin 1,500 Units/hr (11/21/21 2048)   PRN: acetaminophen, famotidine, haloperidol lactate, haloperidol lactate, LORazepam, nitroGLYCERIN, ondansetron (ZOFRAN) IV, traMADol Anti-infectives (From admission, onward)    None      Assessment: Pharmacy consulted to start heparin for ACS. Trop  23K. No DOAC PTA noted.   Goal of Therapy:  Heparin level 0.3-0.7 units/ml Monitor platelets by anticoagulation protocol: Yes   Date/time  Level  Comments 12/20@0931   HL 0.14 Subthera 950un/hr 12/20@1823   HL 0.11 Subthera @1200  un/hr 12/20@0445   HL 0.14 Subthera @1500  un/hr *drawn late due to lab being cancelled by lab personnel as pt was designated "discharged" on their end; confirmed with nurse no issues with heparin infusion/line  Plan:  *Heparin level sub-therapeutic* Give 2300 units bolus x 1 Increase heparin infusion rate to 1800 units/hr Check HL level in 6 hours after rate change  Continue to monitor H&H and platelets with daily CBC  , PharmD Clinical Pharmacist 11/22/2021 6:01 AM

## 2021-11-22 NOTE — Plan of Care (Signed)
°  Problem: Safety: Goal: Violent Restraint(s) Outcome: Not Progressing   Problem: Education: Goal: Knowledge of General Education information will improve Description: Including pain rating scale, medication(s)/side effects and non-pharmacologic comfort measures Outcome: Not Progressing   Problem: Health Behavior/Discharge Planning: Goal: Ability to manage health-related needs will improve Outcome: Not Progressing   Problem: Clinical Measurements: Goal: Ability to maintain clinical measurements within normal limits will improve Outcome: Not Progressing Goal: Will remain free from infection Outcome: Not Progressing Goal: Diagnostic test results will improve Outcome: Not Progressing Goal: Respiratory complications will improve Outcome: Not Progressing Goal: Cardiovascular complication will be avoided Outcome: Not Progressing   Problem: Activity: Goal: Risk for activity intolerance will decrease Outcome: Not Progressing   Problem: Nutrition: Goal: Adequate nutrition will be maintained Outcome: Not Progressing   Problem: Coping: Goal: Level of anxiety will decrease Outcome: Not Progressing   Problem: Elimination: Goal: Will not experience complications related to bowel motility Outcome: Not Progressing Goal: Will not experience complications related to urinary retention Outcome: Not Progressing   Problem: Pain Managment: Goal: General experience of comfort will improve Outcome: Not Progressing   Problem: Safety: Goal: Ability to remain free from injury will improve Outcome: Not Progressing   Problem: Skin Integrity: Goal: Risk for impaired skin integrity will decrease Outcome: Not Progressing   

## 2021-11-23 DIAGNOSIS — I219 Acute myocardial infarction, unspecified: Secondary | ICD-10-CM | POA: Diagnosis not present

## 2021-11-23 LAB — CBC
HCT: 33.5 % — ABNORMAL LOW (ref 39.0–52.0)
Hemoglobin: 11.4 g/dL — ABNORMAL LOW (ref 13.0–17.0)
MCH: 32 pg (ref 26.0–34.0)
MCHC: 34 g/dL (ref 30.0–36.0)
MCV: 94.1 fL (ref 80.0–100.0)
Platelets: 213 10*3/uL (ref 150–400)
RBC: 3.56 MIL/uL — ABNORMAL LOW (ref 4.22–5.81)
RDW: 12.6 % (ref 11.5–15.5)
WBC: 6.3 10*3/uL (ref 4.0–10.5)
nRBC: 0 % (ref 0.0–0.2)

## 2021-11-23 LAB — BASIC METABOLIC PANEL
Anion gap: 6 (ref 5–15)
BUN: 20 mg/dL (ref 8–23)
CO2: 25 mmol/L (ref 22–32)
Calcium: 8.3 mg/dL — ABNORMAL LOW (ref 8.9–10.3)
Chloride: 108 mmol/L (ref 98–111)
Creatinine, Ser: 1 mg/dL (ref 0.61–1.24)
GFR, Estimated: >60 mL/min (ref >60)
Glucose, Bld: 95 mg/dL (ref 70–99)
Potassium: 4.4 mmol/L (ref 3.5–5.1)
Sodium: 139 mmol/L (ref 135–145)

## 2021-11-23 LAB — MAGNESIUM: Magnesium: 2.1 mg/dL (ref 1.7–2.4)

## 2021-11-23 MED ORDER — ENOXAPARIN SODIUM 40 MG/0.4ML IJ SOSY
40.0000 mg | PREFILLED_SYRINGE | INTRAMUSCULAR | Status: DC
  Administered 2021-11-23 – 2022-01-15 (×49): 40 mg via SUBCUTANEOUS
  Filled 2021-11-23 (×53): qty 0.4
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
## 2021-11-23 NOTE — TOC Progression Note (Signed)
Transition of Care [facility]) - Progression Note    Patient Details  Name: Adam [name] MRN: [id number] Date of Birth: 02/03/1961  Transition of [facility]) CM/SW Contact  Gildardo Griffes, [place] Phone Number: 11/23/2021, 9:25 AM  Clinical Narrative:     CSW lvm for [facility] with [facility] at [phone] to get an update on patient's placement, pending call back at this time.   Expected Discharge Plan: Group Home Barriers to Discharge: Continued Medical Work up  Expected Discharge Plan and Services Expected Discharge Plan: Group Home In-house Referral: Clinical Social Work     Living arrangements for the past 2 months: Group Home                                       Social Determinants of Health (SDOH) Interventions    Readmission Risk Interventions No flowsheet data found.

## 2021-11-23 NOTE — Progress Notes (Signed)
Per [facility] supervisor patient requests lotion but eats it.   CSW has informed MD and RN to be aware.   [place], [place] [phone]
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
## 2021-11-24 DIAGNOSIS — F209 Schizophrenia, unspecified: Secondary | ICD-10-CM | POA: Diagnosis not present

## 2021-11-24 MED ORDER — LORAZEPAM 2 MG/ML IJ SOLN
2.0000 mg | Freq: Once | INTRAMUSCULAR | Status: AC
  Administered 2021-11-24: 04:00:00 2 mg via INTRAVENOUS

## 2021-11-24 MED ORDER — ZIPRASIDONE MESYLATE 20 MG IM SOLR
20.0000 mg | Freq: Once | INTRAMUSCULAR | Status: AC
  Administered 2021-11-24: 01:00:00 20 mg via INTRAMUSCULAR
  Filled 2021-11-24: qty 20

## 2021-11-24 MED ORDER — LORAZEPAM 2 MG/ML IJ SOLN
2.0000 mg | Freq: Four times a day (QID) | INTRAMUSCULAR | Status: DC | PRN
  Administered 2021-11-24 – 2021-11-30 (×7): 2 mg via INTRAMUSCULAR
  Administered 2021-12-01: 10:00:00 1 mg via INTRAMUSCULAR
  Administered 2021-12-01 – 2022-01-16 (×28): 2 mg via INTRAMUSCULAR
  Filled 2021-11-24 (×42): qty 1

## 2021-11-24 MED ORDER — HALOPERIDOL LACTATE 5 MG/ML IJ SOLN
5.0000 mg | Freq: Once | INTRAMUSCULAR | Status: DC
  Filled 2021-11-24: qty 1

## 2021-11-24 MED ORDER — LORAZEPAM 2 MG/ML IJ SOLN: 2.0000 mg | Freq: Once | INTRAMUSCULAR | Status: DC

## 2021-11-24 MED ORDER — ZIPRASIDONE MESYLATE 20 MG IM SOLR
20.0000 mg | Freq: Four times a day (QID) | INTRAMUSCULAR | Status: DC | PRN
  Administered 2021-11-24 – 2021-11-29 (×8): 20 mg via INTRAMUSCULAR
  Filled 2021-11-24 (×9): qty 20

## 2021-11-24 MED ORDER — DIPHENHYDRAMINE HCL 50 MG/ML IJ SOLN
50.0000 mg | Freq: Once | INTRAMUSCULAR | Status: AC
  Administered 2021-11-24: 23:00:00 50 mg via INTRAMUSCULAR
  Filled 2021-11-24: qty 1

## 2021-11-24 NOTE — Progress Notes (Signed)
Visited with who was sleep. Patient sitter shared he had a bad night. When Adam [name] is agitated and uncooperative if someone would call the chaplain on call phone to reach me [name]) or  (chaplain Amy Burris ) to help in calming him. This is a patient we follow very closely.
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
## 2021-11-24 NOTE — Consult Note (Addendum)
[facility] Face-to-Face Psychiatry Consult   Reason for Consult: Psychiatric Evaluation Referring Physician: Dr. 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  Smiled on assessment as he was eating his breakfast sitting on the side of his bed.  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  Adam [name] is easily frustrated at times and will often yell or become agitated.  Calm and cooperative on assessment.  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  This provider is familiar with him as he was in the ED for almost 4 months.  He was accepted by a group home from [facility].  After a few days, they brought him to the ED and reported he was too acute to return.  His mother is involved in his care and is the only one who can sometimes understand what he wants.  She often will calm him on the phone.  The sitter reports he seems to be favoring his left arm, not the case in the ED.  Transferred to the  medical floor after a MI.  His is at his baseline psychiatrically with  no threat to self or psychosis noted.  No threat to others in the ED and redirected with chocolate milk when he was frustrated.  Past Psychiatric History: Schizophrenia  Risk to Self:  none Risk to Others:  none Prior Inpatient Therapy:  yes Prior Outpatient Therapy:  yes  Past Medical History:  Past Medical History:  Diagnosis Date    Schizophrenia (HCC)    Stroke (HCC)     Family History: History reviewed. No pertinent family history. Family Psychiatric  History: unknown Social History:  Social History   Substance and Sexual Activity  Alcohol Use None     Social History   Substance and Sexual Activity  Drug Use Not on file    Social History   Socioeconomic History   Marital status: Single    Spouse name: Not on file   Number of children: Not on file   Years of education: Not on file   Highest education level: Not on file  Occupational History   Not on file  Tobacco Use   Smoking status: Unknown   Smokeless tobacco: Not on file  Vaping Use   Vaping Use: Unknown  Substance and Sexual Activity   Alcohol use: Not on file   Drug use: Not on file   Sexual activity: Not on file  Other Topics Concern   Not on file  Social History Narrative   Not on file   Social Determinants of Health   Financial Resource Strain: Not on file  Food Insecurity: Not on file  Transportation Needs: Not on file  Physical Activity: Not on file  Stress: Not on file  Social Connections: Not on file   Additional Social History:    Allergies:   Allergies  Allergen Reactions   Morphine And Related    Nsaids     Labs:  Results for orders placed  or performed during the hospital encounter of 07/20/21 (from the past 48 hour(s))  Heparin level (unfractionated)     Status: None   Collection Time: 11/22/21 12:40 PM  Result Value Ref Range   Heparin Unfractionated 0.30 0.30 - 0.70 IU/mL    Comment: (NOTE) The clinical reportable range upper limit is being lowered to >1.10 to align with the FDA approved guidance for the current laboratory assay.  If heparin results are below expected values, and patient dosage has  been confirmed, suggest follow up testing of antithrombin III levels. Performed at [facility], [address] [place], [place] [address]   CBC     Status: Abnormal   Collection Time: 11/22/21  12:40 PM  Result Value Ref Range   WBC 5.0 4.0 - 10.5 K/uL   RBC 3.40 (L) 4.22 - 5.81 MIL/uL   Hemoglobin 10.8 (L) 13.0 - 17.0 g/dL   HCT 47.3 (L) 40.3 - 70.9 %   MCV 92.4 80.0 - 100.0 fL   MCH 31.8 26.0 - 34.0 pg   MCHC 34.4 30.0 - 36.0 g/dL   RDW 64.3 83.8 - 18.4 %   Platelets 195 150 - 400 K/uL   nRBC 0.0 0.0 - 0.2 %    Comment: Performed at [facility], [address] [place], [place] [address]  CBC     Status: Abnormal   Collection Time: 11/23/21  5:27 AM  Result Value Ref Range   WBC 6.3 4.0 - 10.5 K/uL   RBC 3.56 (L) 4.22 - 5.81 MIL/uL   Hemoglobin 11.4 (L) 13.0 - 17.0 g/dL   HCT 36.0 (L) 67.7 - 03.4 %   MCV 94.1 80.0 - 100.0 fL   MCH 32.0 26.0 - 34.0 pg   MCHC 34.0 30.0 - 36.0 g/dL   RDW 03.5 24.8 - 18.5 %   Platelets 213 150 - 400 K/uL   nRBC 0.0 0.0 - 0.2 %    Comment: Performed at [facility], [address], [place], [place] [address]  Basic metabolic panel     Status: Abnormal   Collection Time: 11/23/21  5:27 AM  Result Value Ref Range   Sodium 139 135 - 145 mmol/L   Potassium 4.4 3.5 - 5.1 mmol/L   Chloride 108 98 - 111 mmol/L   CO2 25 22 - 32 mmol/L   Glucose, Bld 95 70 - 99 mg/dL    Comment: Glucose reference range applies only to samples taken after fasting for at least 8 hours.   BUN 20 8 - 23 mg/dL   Creatinine, Ser 1.21 0.61 - 1.24 mg/dL   Calcium 8.3 (L) 8.9 - 10.3 mg/dL   GFR, Estimated >62 >44 mL/min    Comment: (NOTE) Calculated using the CKD-EPI Creatinine Equation (2021)    Anion gap 6 5 - 15    Comment: Performed at [facility], [address], [place], [place] [address]  Magnesium     Status: None   Collection Time: 11/23/21  5:27 AM  Result Value Ref Range   Magnesium 2.1 1.7 - 2.4 mg/dL    Comment: Performed at [facility], [address] [place], [place] [address]    Current Facility-Administered Medications  Medication Dose Route Frequency Provider Last Rate Last Admin   acetaminophen  (TYLENOL) tablet 650 mg  650 mg Oral Q4H PRN Carollee Herter, DO       aspirin EC tablet 81 mg  81 mg Oral Daily Carollee Herter, DO  81 mg at 11/23/21 1000   atorvastatin (LIPITOR) tablet 40 mg  40 mg Oral Daily Lorine Bears A, MD   40 mg at 11/23/21 2300   benztropine (COGENTIN) tablet 0.5 mg  0.5 mg Oral BID Carollee Herter, DO   0.5 mg at 11/23/21 2300   divalproex (DEPAKOTE) DR tablet 500 mg  500 mg Oral Q12H Carollee Herter, DO   500 mg at 11/23/21 2300   enoxaparin (LOVENOX) injection 40 mg  40 mg Subcutaneous Q24H Ronnald Ramp, RPH   40 mg at 11/23/21 2142   famotidine (PEPCID) tablet 20 mg  20 mg Oral BID PRN Carollee Herter, DO   20 mg at 11/15/21 0630   gabapentin (NEURONTIN) capsule 300 mg  300 mg Oral TID Carollee Herter, DO   300 mg at 11/23/21 2300   haloperidol (HALDOL) tablet 7 mg  7 mg Oral TID Carollee Herter, DO   7 mg at 11/23/21 1000   Or   haloperidol lactate (HALDOL) injection 5 mg  5 mg Intramuscular TID Carollee Herter, DO   5 mg at 11/23/21 2141   haloperidol lactate (HALDOL) injection 5 mg  5 mg Intramuscular Q6H PRN Ronnald Ramp, RPH   5 mg at 11/22/21 2346   Or   haloperidol lactate (HALDOL) injection 5 mg  5 mg Intravenous Q6H PRN Ronnald Ramp, RPH       LORazepam (ATIVAN) injection 1 mg  1 mg Intravenous Q4H PRN Carollee Herter, DO   1 mg at 11/23/21 2308   LORazepam (ATIVAN) tablet 1 mg  1 mg Oral TID Carollee Herter, DO   1 mg at 11/23/21 2254   multivitamin with minerals tablet 1 tablet  1 tablet Oral Daily Carollee Herter, DO   1 tablet at 11/23/21 1000   nitroGLYCERIN (NITROSTAT) SL tablet 0.4 mg  0.4 mg Sublingual Q5 Min x 3 PRN Carollee Herter, DO       ondansetron [facility] injection 4 mg  4 mg Intravenous Q6H PRN Carollee Herter, DO       paliperidone (INVEGA) 24 hr tablet 6 mg  6 mg Oral Q1400 Carollee Herter, DO   6 mg at 11/22/21 1539   temazepam (RESTORIL) capsule 15 mg  15 mg Oral QHS Carollee Herter, DO   15 mg at 11/23/21 2254   thiamine tablet 100 mg  100 mg Oral Daily Carollee Herter, DO   100 mg at 11/23/21 1000    traMADol (ULTRAM) tablet 50 mg  50 mg Oral Q12H PRN Carollee Herter, DO   50 mg at 11/21/21 2304    Musculoskeletal: Strength & Muscle Tone: within normal limits Gait & Station: normal Patient leans: N/A Psychiatric Specialty Exam: Physical Exam Vitals and nursing note reviewed.  HENT:     Head: Normocephalic and atraumatic.     Nose: Nose normal.  Cardiovascular:     Rate and Rhythm: Normal rate.  Pulmonary:     Effort: Pulmonary effort is normal.  Musculoskeletal:        General: Normal range of motion.     Cervical back: Normal range of motion.  Neurological:     Mental Status: He is alert.  Psychiatric:        Attention and Perception: He is inattentive.        Mood and Affect: Mood and affect normal.        Speech: Speech is slurred.        Behavior: Behavior is cooperative.  Thought Content: Thought content normal.        Cognition and Memory: Cognition is impaired.        Judgment: Judgment is impulsive.    Review of Systems  Neurological:  Positive for weakness.  All other systems reviewed and are negative.  Blood pressure 120/64, pulse (!) 103, temperature 98 F (36.7 C), resp. rate 20, height 5\' 8"  (1.727 m), weight 79.4 kg, SpO2 100 %.Body mass index is 26.61 kg/m.  General Appearance: Casual  Eye Contact:  Good  Speech:  Slurred, few words  Volume:  Normal  Mood:  Euthymic  Affect:  Congruent  Thought Process:  Unable to assess, UTA  Orientation:  UTA  Thought Content:  Not responding to internal stimuli  Suicidal Thoughts:  No threat to self  Homicidal Thoughts:  no threat to others  Memory:  UTA  Judgement:  Impaired  Insight:  UTA  Psychomotor Activity:  Normal  Concentration:  Concentration: Fair and Attention Span: Fair  Recall:  Poor  Fund of Knowledge:   UTA  Language:  Poor  Akathisia:  No  Handed:  Using his left hand  AIMS (if indicated):     Assets:  Leisure Time Resilience Social Support  ADL's:  Impaired  Cognition:   Impaired,  Moderate  Sleep:        Physical Exam: Physical Exam Vitals and nursing note reviewed.  HENT:     Head: Normocephalic and atraumatic.     Nose: Nose normal.  Cardiovascular:     Rate and Rhythm: Normal rate.  Pulmonary:     Effort: Pulmonary effort is normal.  Musculoskeletal:        General: Normal range of motion.     Cervical back: Normal range of motion.  Neurological:     Mental Status: He is alert.  Psychiatric:        Attention and Perception: He is inattentive.        Mood and Affect: Mood and affect normal.        Speech: Speech is slurred.        Behavior: Behavior is cooperative.        Thought Content: Thought content normal.        Cognition and Memory: Cognition is impaired.        Judgment: Judgment is impulsive.   Review of Systems  Neurological:  Positive for weakness.  All other systems reviewed and are negative. Blood pressure 120/64, pulse (!) 103, temperature 98 F (36.7 C), resp. rate 20, height 5\' 8"  (1.727 m), weight 79.4 kg, SpO2 100 %. Body mass index is 26.61 kg/m.  Treatment Plan Summary:  Schizophrenia, chronic: Continue Depakote 500 mg BID Continue Haldol 7 mg TID Continue Invega 6 mg daily  EPS: -Continue Cogentin 0.5 mg BID  Anxiety: -Continue gabapentin 300 mg TID -Continue Ativan 1 mg every 4 hours PRN  Insomnia: -Continue Restoril 15 mg at bedtime  Disposition: Client is at his baseline, placement needed.  Re consult psych as needed  Nanine Means, NP 11/24/2021 10:04 AM
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
## 2021-11-25 ENCOUNTER — Encounter: Payer: Self-pay | Admitting: Family Medicine

## 2021-11-25 DIAGNOSIS — F209 Schizophrenia, unspecified: Secondary | ICD-10-CM | POA: Diagnosis not present

## 2021-11-25 LAB — GLUCOSE, CAPILLARY: Glucose-Capillary: 91 mg/dL (ref 70–99)

## 2021-11-25 MED ORDER — LORAZEPAM 2 MG/ML IJ SOLN: 2.0000 mg | INTRAMUSCULAR | Status: DC

## 2021-11-25 MED ORDER — DEXMEDETOMIDINE HCL IN NACL 400 MCG/100ML IV SOLN
0.4000 ug/kg/h | INTRAVENOUS | Status: DC
  Administered 2021-11-25: 21:00:00 0.4 ug/kg/h via INTRAVENOUS
  Administered 2021-11-26: 05:00:00 0.6 ug/kg/h via INTRAVENOUS
  Administered 2021-11-26 – 2021-11-27 (×3): 1 ug/kg/h via INTRAVENOUS
  Administered 2021-11-27 (×2): 1.2 ug/kg/h via INTRAVENOUS
  Administered 2021-11-27: 02:00:00 1 ug/kg/h via INTRAVENOUS
  Administered 2021-11-27 (×2): 1.2 ug/kg/h via INTRAVENOUS
  Administered 2021-11-28: 18:00:00 0.8 ug/kg/h via INTRAVENOUS
  Administered 2021-11-28: 11:00:00 0.9 ug/kg/h via INTRAVENOUS
  Administered 2021-11-28 – 2021-11-29 (×3): 0.8 ug/kg/h via INTRAVENOUS
  Administered 2021-11-29: 13:00:00 0.7 ug/kg/h via INTRAVENOUS
  Administered 2021-11-29 – 2021-11-30 (×2): 1 ug/kg/h via INTRAVENOUS
  Administered 2021-11-30: 02:00:00 1.2 ug/kg/h via INTRAVENOUS
  Filled 2021-11-25 (×20): qty 100

## 2021-11-25 MED ORDER — DEXMEDETOMIDINE HCL IN NACL 200 MCG/50ML IV SOLN
0.4000 ug/kg/h | INTRAVENOUS | Status: DC
  Filled 2021-11-25: qty 50
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
## 2021-11-25 NOTE — Progress Notes (Signed)
@  1944 B. Jon Billings, on-call for attending, paged regarding pt's severe agitation that per documentation has been very persistent since admission to this unit presently requiring 3-5 staff members to keep pt safe. IM Haldol and Ativan administered with minimal response.  Page promptly returned and orders received for Precedex gtt, violent restraints, and transfer to ICU. Report called to ICU and pt transferred.

## 2021-11-26 DIAGNOSIS — F209 Schizophrenia, unspecified: Secondary | ICD-10-CM | POA: Diagnosis not present

## 2021-11-26 MED ORDER — LORAZEPAM 2 MG/ML IJ SOLN
2.0000 mg | INTRAMUSCULAR | Status: AC
  Administered 2021-11-26: 17:00:00 2 mg via INTRAVENOUS
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
## 2021-11-26 NOTE — Progress Notes (Signed)
Cross Cover Patient again with severe agitation and combative requiring safety personal and 4 point restraint. Previously given haldol ativan and benadryl without any improvement.  Doubtful that offering patient chocolate milk, as suggested by clinician with psychology services will be of much benefit in his acutely sever agitated state. Patient transferred to ICU for precedex infusion For the safety of Adam [name] and others (staff) patient transferred to ICU for precedex therapy
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
## 2021-11-27 ENCOUNTER — Encounter: Payer: Self-pay | Admitting: Family Medicine

## 2021-11-27 DIAGNOSIS — R4689 Other symptoms and signs involving appearance and behavior: Secondary | ICD-10-CM

## 2021-11-27 DIAGNOSIS — F09 Unspecified mental disorder due to known physiological condition: Secondary | ICD-10-CM

## 2021-11-27 DIAGNOSIS — F209 Schizophrenia, unspecified: Secondary | ICD-10-CM | POA: Diagnosis not present

## 2021-11-27 DIAGNOSIS — S069XAS Unspecified intracranial injury with loss of consciousness status unknown, sequela: Secondary | ICD-10-CM

## 2021-11-27 DIAGNOSIS — I639 Cerebral infarction, unspecified: Secondary | ICD-10-CM

## 2021-11-27 DIAGNOSIS — G3189 Other specified degenerative diseases of nervous system: Secondary | ICD-10-CM | POA: Diagnosis not present

## 2021-11-27 DIAGNOSIS — S069XAA Unspecified intracranial injury with loss of consciousness status unknown, initial encounter: Secondary | ICD-10-CM | POA: Diagnosis present

## 2021-11-27 DIAGNOSIS — I214 Non-ST elevation (NSTEMI) myocardial infarction: Secondary | ICD-10-CM | POA: Diagnosis not present

## 2021-11-27 LAB — GLUCOSE, CAPILLARY: Glucose-Capillary: 99 mg/dL (ref 70–99)

## 2021-11-27 LAB — MRSA NEXT GEN BY PCR, NASAL: MRSA by PCR Next Gen: NOT DETECTED

## 2021-11-27 MED ORDER — LACTATED RINGERS IV SOLN: INTRAVENOUS | Status: DC

## 2021-11-27 MED ORDER — ORAL CARE MOUTH RINSE
15.0000 mL | Freq: Two times a day (BID) | OROMUCOSAL | Status: DC
  Administered 2021-11-28 – 2022-01-31 (×58): 15 mL via OROMUCOSAL

## 2021-11-27 MED ORDER — TAMSULOSIN HCL 0.4 MG PO CAPS
0.4000 mg | ORAL_CAPSULE | Freq: Every day | ORAL | Status: DC
  Administered 2021-12-01 – 2021-12-12 (×11): 0.4 mg via ORAL
  Filled 2021-11-27 (×11): qty 1

## 2021-11-27 MED ORDER — THIAMINE HCL 100 MG/ML IJ SOLN
500.0000 mg | Freq: Every day | INTRAVENOUS | Status: AC
  Administered 2021-11-28 – 2021-12-02 (×5): 500 mg via INTRAVENOUS
  Filled 2021-11-27 (×5): qty 5

## 2021-11-27 MED ORDER — CLONAZEPAM 0.5 MG PO TBDP
1.0000 mg | ORAL_TABLET | Freq: Three times a day (TID) | ORAL | Status: DC
Start: 2021-11-27 — End: 2021-11-28
  Administered 2021-11-28: 09:00:00 1 mg via ORAL
  Filled 2021-11-27: qty 2

## 2021-11-27 MED ORDER — FOLIC ACID 5 MG/ML IJ SOLN
1.0000 mg | Freq: Every day | INTRAMUSCULAR | Status: DC
  Administered 2021-11-28 – 2021-12-03 (×5): 1 mg via INTRAVENOUS
  Filled 2021-11-27 (×6): qty 0.2

## 2021-11-27 MED ORDER — THIAMINE HCL 100 MG/ML IJ SOLN
100.0000 mg | Freq: Every day | INTRAMUSCULAR | Status: DC
Start: 2021-11-28 — End: 2021-11-27

## 2021-11-27 MED ORDER — PALIPERIDONE PALMITATE ER 234 MG/1.5ML IM SUSY
234.0000 mg | PREFILLED_SYRINGE | Freq: Once | INTRAMUSCULAR | Status: AC
  Administered 2021-11-27: 234 mg via INTRAMUSCULAR
  Filled 2021-11-27: qty 1.5

## 2021-11-27 MED ORDER — VALPROATE SODIUM 100 MG/ML IV SOLN
500.0000 mg | Freq: Two times a day (BID) | INTRAVENOUS | Status: DC
  Administered 2021-11-27 – 2021-11-28 (×4): 500 mg via INTRAVENOUS
  Filled 2021-11-27 (×6): qty 5

## 2021-11-27 NOTE — Plan of Care (Signed)
  Problem: Safety: Goal: Violent Restraint(s) Outcome: Progressing   
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
## 2021-11-27 NOTE — Progress Notes (Signed)
°   11/27/21 1000  Clinical Encounter Type  Visited With Health care provider;Patient not available  Visit Type Follow-up   Chaplain Burris made a f/u visit to check on Pt well-being. Pt under sedation at this time. Chaplain engaged Chief Financial Officer to inform them of chaplain availability as a supportive, non-anxious presence and someone with an established relationship with Pt. Will continue to follow.
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
## 2021-11-28 DIAGNOSIS — G3189 Other specified degenerative diseases of nervous system: Secondary | ICD-10-CM | POA: Diagnosis not present

## 2021-11-28 DIAGNOSIS — S069XAS Unspecified intracranial injury with loss of consciousness status unknown, sequela: Secondary | ICD-10-CM | POA: Diagnosis not present

## 2021-11-28 DIAGNOSIS — F09 Unspecified mental disorder due to known physiological condition: Secondary | ICD-10-CM | POA: Diagnosis not present

## 2021-11-28 DIAGNOSIS — E860 Dehydration: Secondary | ICD-10-CM

## 2021-11-28 DIAGNOSIS — F209 Schizophrenia, unspecified: Secondary | ICD-10-CM | POA: Diagnosis not present

## 2021-11-28 DIAGNOSIS — I214 Non-ST elevation (NSTEMI) myocardial infarction: Secondary | ICD-10-CM | POA: Diagnosis not present

## 2021-11-28 DIAGNOSIS — R410 Disorientation, unspecified: Secondary | ICD-10-CM

## 2021-11-28 DIAGNOSIS — R339 Retention of urine, unspecified: Secondary | ICD-10-CM

## 2021-11-28 DIAGNOSIS — F205 Residual schizophrenia: Secondary | ICD-10-CM

## 2021-11-28 DIAGNOSIS — R4689 Other symptoms and signs involving appearance and behavior: Secondary | ICD-10-CM | POA: Diagnosis not present

## 2021-11-28 LAB — CBC
HCT: 40.3 % (ref 39.0–52.0)
Hemoglobin: 13.4 g/dL (ref 13.0–17.0)
MCH: 30.7 pg (ref 26.0–34.0)
MCHC: 33.3 g/dL (ref 30.0–36.0)
MCV: 92.2 fL (ref 80.0–100.0)
Platelets: 379 10*3/uL (ref 150–400)
RBC: 4.37 MIL/uL (ref 4.22–5.81)
RDW: 12.3 % (ref 11.5–15.5)
WBC: 8.8 10*3/uL (ref 4.0–10.5)
nRBC: 0 % (ref 0.0–0.2)

## 2021-11-28 LAB — MAGNESIUM: Magnesium: 2 mg/dL (ref 1.7–2.4)

## 2021-11-28 LAB — BASIC METABOLIC PANEL
Anion gap: 9 (ref 5–15)
BUN: 25 mg/dL — ABNORMAL HIGH (ref 8–23)
CO2: 24 mmol/L (ref 22–32)
Calcium: 8.8 mg/dL — ABNORMAL LOW (ref 8.9–10.3)
Chloride: 110 mmol/L (ref 98–111)
Creatinine, Ser: 0.98 mg/dL (ref 0.61–1.24)
GFR, Estimated: >60 mL/min (ref >60)
Glucose, Bld: 104 mg/dL — ABNORMAL HIGH (ref 70–99)
Potassium: 4.3 mmol/L (ref 3.5–5.1)
Sodium: 143 mmol/L (ref 135–145)

## 2021-11-28 MED ORDER — CLONAZEPAM 0.5 MG PO TBDP
2.0000 mg | ORAL_TABLET | Freq: Three times a day (TID) | ORAL | Status: DC
  Administered 2021-11-28 – 2021-11-29 (×3): 2 mg via ORAL
  Filled 2021-11-28 (×3): qty 4

## 2021-11-28 NOTE — Progress Notes (Incomplete)
Spoke with patients mom regarding medication administration & current medications the patient is on. Explained why the patient is on a Precedex drip and goals regarding weaning him off the Precedex drip.

## 2021-11-28 NOTE — Plan of Care (Signed)
Neuro: has progressed to following some commands like being told to swallow, put arms down, don't yell. Continuing to wean Precedex fairly well, slightly aggressive this AM, has been improving over the course of the day, continues to call out, enjoys listening to music (Deep Purple and Rainbow are preferred bands) *Neuro update-became more agitated this evening, required a dose of Geodon and an increase of his Precedex Resp: stable on room air CV: afebrile, vital signs stable GIGU: foley in place, no BM, tolerating swabs of apple juice and water also enjoys chocolate milk and lemon lime soda Skin: clean dry and intact  Social: no contact with family today  Problem: Safety: Goal: Violent Restraint(s) Outcome: Progressing   Problem: Clinical Measurements: Goal: Will remain free from infection Outcome: Progressing Goal: Diagnostic test results will improve Outcome: Progressing Goal: Respiratory complications will improve Outcome: Progressing Goal: Cardiovascular complication will be avoided Outcome: Progressing   Problem: Activity: Goal: Risk for activity intolerance will decrease Outcome: Progressing   Problem: Pain Managment: Goal: General experience of comfort will improve Outcome: Progressing   Problem: Safety: Goal: Ability to remain free from injury will improve Outcome: Progressing   Problem: Skin Integrity: Goal: Risk for impaired skin integrity will decrease Outcome: Progressing
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
## 2021-11-29 DIAGNOSIS — J810 Acute pulmonary edema: Secondary | ICD-10-CM | POA: Diagnosis not present

## 2021-11-29 DIAGNOSIS — F09 Unspecified mental disorder due to known physiological condition: Secondary | ICD-10-CM | POA: Diagnosis not present

## 2021-11-29 DIAGNOSIS — R451 Restlessness and agitation: Secondary | ICD-10-CM

## 2021-11-29 DIAGNOSIS — G9341 Metabolic encephalopathy: Secondary | ICD-10-CM

## 2021-11-29 DIAGNOSIS — I639 Cerebral infarction, unspecified: Secondary | ICD-10-CM

## 2021-11-29 DIAGNOSIS — R4689 Other symptoms and signs involving appearance and behavior: Secondary | ICD-10-CM | POA: Diagnosis not present

## 2021-11-29 DIAGNOSIS — F209 Schizophrenia, unspecified: Secondary | ICD-10-CM | POA: Diagnosis not present

## 2021-11-29 DIAGNOSIS — G3189 Other specified degenerative diseases of nervous system: Secondary | ICD-10-CM | POA: Diagnosis not present

## 2021-11-29 DIAGNOSIS — I214 Non-ST elevation (NSTEMI) myocardial infarction: Secondary | ICD-10-CM | POA: Diagnosis not present

## 2021-11-29 DIAGNOSIS — S069XAS Unspecified intracranial injury with loss of consciousness status unknown, sequela: Secondary | ICD-10-CM | POA: Diagnosis not present

## 2021-11-29 MED ORDER — HALOPERIDOL 5 MG PO TABS
5.0000 mg | ORAL_TABLET | Freq: Four times a day (QID) | ORAL | Status: DC | PRN
  Administered 2021-12-02 – 2022-01-21 (×19): 5 mg via ORAL
  Filled 2021-11-29 (×23): qty 1

## 2021-11-29 MED ORDER — DIPHENHYDRAMINE HCL 25 MG PO CAPS
50.0000 mg | ORAL_CAPSULE | Freq: Four times a day (QID) | ORAL | Status: DC | PRN
  Administered 2021-12-06 – 2022-01-21 (×30): 50 mg via ORAL
  Filled 2021-11-29 (×8): qty 2
  Filled 2021-11-29: qty 1
  Filled 2021-11-29 (×18): qty 2
  Filled 2021-11-29: qty 1
  Filled 2021-11-29 (×13): qty 2

## 2021-11-29 MED ORDER — DIAZEPAM 5 MG/ML IJ SOLN
5.0000 mg | Freq: Four times a day (QID) | INTRAMUSCULAR | Status: DC
  Administered 2021-11-29 – 2021-12-01 (×11): 5 mg via INTRAVENOUS
  Filled 2021-11-29 (×11): qty 2

## 2021-11-29 MED ORDER — HALOPERIDOL 5 MG PO TABS
5.0000 mg | ORAL_TABLET | Freq: Three times a day (TID) | ORAL | Status: DC
  Administered 2021-11-29 – 2022-03-19 (×308): 5 mg via ORAL
  Filled 2021-11-29 (×332): qty 1

## 2021-11-29 MED ORDER — ZIPRASIDONE MESYLATE 20 MG IM SOLR
10.0000 mg | Freq: Four times a day (QID) | INTRAMUSCULAR | Status: DC | PRN
  Administered 2021-11-29: 14:00:00 10 mg via INTRAMUSCULAR
  Filled 2021-11-29: qty 20

## 2021-11-29 MED ORDER — HALOPERIDOL LACTATE 5 MG/ML IJ SOLN
5.0000 mg | Freq: Four times a day (QID) | INTRAMUSCULAR | Status: DC | PRN
  Administered 2021-11-30 – 2022-01-19 (×43): 5 mg via INTRAMUSCULAR
  Filled 2021-11-29 (×49): qty 1

## 2021-11-29 MED ORDER — DIPHENHYDRAMINE HCL 50 MG/ML IJ SOLN
50.0000 mg | Freq: Four times a day (QID) | INTRAMUSCULAR | Status: DC | PRN
  Administered 2021-11-29 – 2022-01-19 (×40): 50 mg via INTRAMUSCULAR
  Filled 2021-11-29 (×46): qty 1
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
## 2021-11-29 NOTE — TOC Progression Note (Signed)
Transition of Care [facility]) - Progression Note    Patient Details  Name: Adam [name] MRN: [id number] Date of Birth: January 21, 1961  Transition of Care [facility]) CM/SW Contact  Allayne Butcher, RN Phone Number: 11/29/2021, 11:33 AM  Clinical Narrative:    Patient currently in the ICU stepdown status due to agitation and aggression, he was placed on precidex infusion.  Per Icu MD precidex will be stopped today and psych will be re consulted.     Expected Discharge Plan: Group Home Barriers to Discharge: Continued Medical Work up  Expected Discharge Plan and Services Expected Discharge Plan: Group Home In-house Referral: Clinical Social Work     Living arrangements for the past 2 months: Group Home                                       Social Determinants of Health (SDOH) Interventions    Readmission Risk Interventions No flowsheet data found.
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
## 2021-11-30 DIAGNOSIS — I214 Non-ST elevation (NSTEMI) myocardial infarction: Secondary | ICD-10-CM | POA: Diagnosis not present

## 2021-11-30 DIAGNOSIS — G3189 Other specified degenerative diseases of nervous system: Secondary | ICD-10-CM | POA: Diagnosis not present

## 2021-11-30 DIAGNOSIS — S069XAS Unspecified intracranial injury with loss of consciousness status unknown, sequela: Secondary | ICD-10-CM | POA: Diagnosis not present

## 2021-11-30 DIAGNOSIS — F09 Unspecified mental disorder due to known physiological condition: Secondary | ICD-10-CM | POA: Diagnosis not present

## 2021-11-30 DIAGNOSIS — R4689 Other symptoms and signs involving appearance and behavior: Secondary | ICD-10-CM | POA: Diagnosis not present

## 2021-11-30 DIAGNOSIS — F209 Schizophrenia, unspecified: Secondary | ICD-10-CM | POA: Diagnosis not present

## 2021-11-30 MED ORDER — PHENOBARBITAL SODIUM 130 MG/ML IJ SOLN
130.0000 mg | Freq: Three times a day (TID) | INTRAMUSCULAR | Status: DC
Start: 2021-11-30 — End: 2021-12-04
  Administered 2021-12-01 – 2021-12-03 (×9): 130 mg via INTRAVENOUS
  Filled 2021-11-30 (×9): qty 1

## 2021-11-30 MED ORDER — ZIPRASIDONE MESYLATE 20 MG IM SOLR
10.0000 mg | Freq: Once | INTRAMUSCULAR | Status: AC
  Administered 2021-11-30: 21:00:00 10 mg via INTRAMUSCULAR
  Filled 2021-11-30: qty 20

## 2021-11-30 MED ORDER — PHENOBARBITAL SODIUM 130 MG/ML IJ SOLN
130.0000 mg | Freq: Two times a day (BID) | INTRAMUSCULAR | Status: DC
Start: 2021-11-30 — End: 2021-11-30
  Administered 2021-11-30: 11:00:00 130 mg via INTRAVENOUS
  Filled 2021-11-30: qty 1
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
## 2021-12-01 DIAGNOSIS — F209 Schizophrenia, unspecified: Secondary | ICD-10-CM | POA: Diagnosis not present

## 2021-12-01 LAB — BASIC METABOLIC PANEL
Anion gap: 13 (ref 5–15)
BUN: 19 mg/dL (ref 8–23)
CO2: 24 mmol/L (ref 22–32)
Calcium: 8.7 mg/dL — ABNORMAL LOW (ref 8.9–10.3)
Chloride: 108 mmol/L (ref 98–111)
Creatinine, Ser: 1.21 mg/dL (ref 0.61–1.24)
GFR, Estimated: >60 mL/min (ref >60)
Glucose, Bld: 97 mg/dL (ref 70–99)
Potassium: 3.6 mmol/L (ref 3.5–5.1)
Sodium: 145 mmol/L (ref 135–145)

## 2021-12-01 LAB — CBC
HCT: 36.1 % — ABNORMAL LOW (ref 39.0–52.0)
Hemoglobin: 12.2 g/dL — ABNORMAL LOW (ref 13.0–17.0)
MCH: 30.9 pg (ref 26.0–34.0)
MCHC: 33.8 g/dL (ref 30.0–36.0)
MCV: 91.4 fL (ref 80.0–100.0)
Platelets: 409 10*3/uL — ABNORMAL HIGH (ref 150–400)
RBC: 3.95 MIL/uL — ABNORMAL LOW (ref 4.22–5.81)
RDW: 12.8 % (ref 11.5–15.5)
WBC: 9.2 10*3/uL (ref 4.0–10.5)
nRBC: 0 % (ref 0.0–0.2)

## 2021-12-01 LAB — PHENOBARBITAL LEVEL: Phenobarbital: <5 ug/mL — ABNORMAL LOW (ref 15.0–30.0)

## 2021-12-01 MED ORDER — HALOPERIDOL LACTATE 5 MG/ML IJ SOLN
5.0000 mg | Freq: Once | INTRAMUSCULAR | Status: AC
  Administered 2021-12-01: 13:00:00 5 mg via INTRAMUSCULAR
  Filled 2021-12-01: qty 1
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
## 2021-12-01 NOTE — Progress Notes (Signed)
Precedex is not working and not started PCCM to sign off

## 2021-12-02 DIAGNOSIS — F209 Schizophrenia, unspecified: Secondary | ICD-10-CM | POA: Diagnosis not present

## 2021-12-02 NOTE — Progress Notes (Signed)
Checked in on patient who Adam [name] and myself continue to follow. Will continue to follow up.

## 2021-12-02 NOTE — Progress Notes (Signed)
Throughout the shift patient has had intermittent moments of saying words and phrases loudly along with intermittent laughter.  Earlier in the shift patient was sleeping comfortably and in stable condition.  Patient had a bath and linen change and continues with sitter at bedside.  Will continue to monitor.

## 2021-12-02 NOTE — Plan of Care (Signed)
Continuing with plan of care. 

## 2021-12-02 NOTE — Progress Notes (Signed)
PROGRESS NOTE    Adam [name]  EHO:[id number] DOB: [birth year] DOA: 07/20/2021 PCP: Oneita Hurt, No    Brief Narrative:  Adam [name] is a 60 year old male with past medical history significant for schizophrenia, CVA, traumatic brain injury, who was initially admitted to [facility] ER on July 20, 2021.  Patient has spent more than 123 days and then ED awaiting psychiatric placement as he was initially brought to the ED for violent outburst at his group home.  Due to difficulty in placing him into a facility, patient essentially been living in the ED.  Patient was IVC it initially.  On 11/20/2021, patient was noted be lethargic and found to have an NSTEMI.  Cardiology was consulted.  Admitted to the hospitalist service.   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  Seen by cardiology likely inferior infarct and wall motion abnormality noted on echocardiogram.  Was started on a heparin drip x48 hours and discontinued.  Cardiology recommended a conservative approach given his extensive psychiatric history and inability to follow commands.  Beta-blocker not added due to hypotension. --Atorvastatin 40 mg p.o. daily --Aspirin 81 mg p.o. 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 nightly  EPS: Cogentin 0.5 mg p.o. twice daily  Acute urinary retention Noted on 12/26 after being sedated; and out catheterization with 900 mils of urine in place.  Continued with retention and had Foley catheter placed 12/26. --Tamsulosin 0.4 mg p.o. daily --Can attempt voiding trial 12/04/2021  Acute pulmonary edema: Resolved Chest x-ray with mild cardiomegaly, diffuse interstitial opacities suggesting pulmonary edema.  COVID-19 and influenza A/B PCR negative.  Currently oxygenating well on room air.  Poor oral intake --Continue to encourage increased oral intake, limited by his agitation/behavioral disturbances --LR 75 mL/h  DVT prophylaxis: enoxaparin (LOVENOX) injection 40 mg Start: 11/23/21 2200 SCDs Start: 11/20/21 2032   Code Status: DNR Family Communication: No family present at bedside this morning  Disposition Plan:  Level of care: Med-Surg Status is: Inpatient  Remains inpatient appropriate because: Continues with poor oral intake, intermittent episodes of agitation, refusal of oral medications, no safe disposition as needs placement.    Consultants:  Cardiology Psychiatry PCCM  Procedures:  TTE  Antimicrobials:  None   Subjective: Patient seen examined at bedside, currently sleeping and calm.  Recently received Haldol.  Continues with intermittent agitation.  Sitter present at bedside. No other acute events overnight per nursing staff other than his intermittent agitation and refusal for oral intake and oral medication.  Objective: Vitals:   12/02/21 1000 12/02/21 1100 12/02/21 1200 12/02/21 1300  BP: 121/74 100/73 116/79 (!) 99/50  Pulse: 88 92 90 (!) 114  Resp: 10 15 (!) 9 16  Temp:  98.4 F (36.9 C)    TempSrc:  Axillary    SpO2: 96% 96% 94% 93%  Weight:  Height:        Intake/Output Summary (Last 24 hours) at 12/02/2021 1502 Last data filed at 12/02/2021 1400 Gross per 24 hour  Intake 50 ml   Output 550 ml  Net -500 ml   Filed Weights   07/20/21 1635  Weight: 79.4 kg    Examination:  General exam: Confused, agitated Respiratory system: Clear to auscultation.  Normal respiratory effort on room air Cardiovascular system: S1 & S2 heard, RRR. No JVD, murmurs, rubs, gallops or clicks.  Trace lower extremity edema bilaterally. Gastrointestinal system: Abdomen is nondistended, soft and nontender. No organomegaly or masses felt. Normal bowel sounds heard. GU: Foley catheter noted with clear yellow urine in collection bag Central nervous system: Alert with agitation at times; unable to fully assess due to mental status Extremities: Moves all extremities independently Skin: No rashes, lesions or ulcers Psychiatry: Judgment and insight poor, agitation    Data Reviewed: I have personally reviewed following labs and imaging studies  CBC: Recent Labs  Lab 11/28/21 0432 12/01/21 0442  WBC 8.8 9.2  HGB 13.4 12.2*  HCT 40.3 36.1*  MCV 92.2 91.4  PLT 379 409*   Basic Metabolic Panel: Recent Labs  Lab 11/28/21 0432 12/01/21 0442  NA 143 145  K 4.3 3.6  CL 110 108  CO2 24 24  GLUCOSE 104* 97  BUN 25* 19  CREATININE 0.98 1.21  CALCIUM 8.8* 8.7*  MG 2.0  --    GFR: Estimated Creatinine Clearance: 62 mL/min (by C-G formula based on SCr of 1.21 mg/dL). Liver Function Tests: No results for input(s): AST, ALT, ALKPHOS, BILITOT, PROT, ALBUMIN in the last 168 hours. No results for input(s): LIPASE, AMYLASE in the last 168 hours. No results for input(s): AMMONIA in the last 168 hours. Coagulation Profile: No results for input(s): INR, PROTIME in the last 168 hours. Cardiac Enzymes: No results for input(s): CKTOTAL, CKMB, CKMBINDEX, TROPONINI in the last 168 hours. BNP (last 3 results) No results for input(s): PROBNP in the last 8760 hours. HbA1C: No results for input(s): HGBA1C in the last 72 hours. CBG: Recent Labs  Lab 11/25/21 2028 11/27/21 1936  GLUCAP 91 99    Lipid Profile: No results for input(s): CHOL, HDL, LDLCALC, TRIG, CHOLHDL, LDLDIRECT in the last 72 hours. Thyroid Function Tests: No results for input(s): TSH, T4TOTAL, FREET4, T3FREE, THYROIDAB in the last 72 hours. Anemia Panel: No results for input(s): VITAMINB12, FOLATE, FERRITIN, TIBC, IRON, RETICCTPCT in the last 72 hours. Sepsis Labs: No results for input(s): PROCALCITON, LATICACIDVEN in the last 168 hours.  Recent Results (from the past 240 hour(s))  MRSA Next Gen by PCR, Nasal     Status: None   Collection Time: 11/27/21  4:55 AM   Specimen: Nasal Mucosa; Nasal Swab  Result Value Ref Range Status   MRSA by PCR Next Gen NOT DETECTED NOT DETECTED Final    Comment: (NOTE) The GeneXpert MRSA Assay (FDA approved for NASAL specimens only), is one component of a comprehensive MRSA colonization surveillance program. It is not intended to diagnose MRSA infection nor to guide or monitor treatment for MRSA infections. Test performance is not FDA approved in patients less than 46 years old. Performed at [facility], [address], [place], [place] [address]          Radiology Studies: No results found.      Scheduled Meds:  aspirin EC  81 mg Oral Daily   atorvastatin  40 mg Oral Daily   benztropine  0.5  mg Oral BID   enoxaparin (LOVENOX) injection  40 mg Subcutaneous Q24H   folic acid  1 mg Intravenous Daily   gabapentin  300 mg Oral TID   haloperidol  5 mg Oral TID   mouth rinse  15 mL Mouth Rinse BID   multivitamin with minerals  1 tablet Oral Daily   PHENObarbital  130 mg Intravenous TID   tamsulosin  0.4 mg Oral QPC supper   temazepam  15 mg Oral QHS   Continuous Infusions:  lactated ringers Stopped (11/30/21 2016)     LOS: 12 days    Time spent: 38 minutes spent on chart review, discussion with nursing staff, consultants, updating family and interview/physical exam; more than 50% of that time was spent in counseling and/or coordination of  care.    Alvira Philips [place], DO [facility] Available via Epic secure chat 7am-7pm After these hours, please refer to coverage provider listed on [email or web address] 12/02/2021, 3:02 PM

## 2021-12-03 DIAGNOSIS — F209 Schizophrenia, unspecified: Secondary | ICD-10-CM | POA: Diagnosis not present

## 2021-12-03 LAB — BASIC METABOLIC PANEL
Anion gap: 8 (ref 5–15)
BUN: 15 mg/dL (ref 8–23)
CO2: 22 mmol/L (ref 22–32)
Calcium: 8.7 mg/dL — ABNORMAL LOW (ref 8.9–10.3)
Chloride: 104 mmol/L (ref 98–111)
Creatinine, Ser: 1.04 mg/dL (ref 0.61–1.24)
GFR, Estimated: >60 mL/min (ref >60)
Glucose, Bld: 120 mg/dL — ABNORMAL HIGH (ref 70–99)
Potassium: 3.9 mmol/L (ref 3.5–5.1)
Sodium: 134 mmol/L — ABNORMAL LOW (ref 135–145)

## 2021-12-03 LAB — CBC WITH DIFFERENTIAL/PLATELET
Abs Immature Granulocytes: 0.08 10*3/uL — ABNORMAL HIGH (ref 0.00–0.07)
Basophils Absolute: 0.1 10*3/uL (ref 0.0–0.1)
Basophils Relative: 1 %
Eosinophils Absolute: 0.3 10*3/uL (ref 0.0–0.5)
Eosinophils Relative: 4 %
HCT: 38.3 % — ABNORMAL LOW (ref 39.0–52.0)
Hemoglobin: 12.5 g/dL — ABNORMAL LOW (ref 13.0–17.0)
Immature Granulocytes: 1 %
Lymphocytes Relative: 20 %
Lymphs Abs: 1.7 10*3/uL (ref 0.7–4.0)
MCH: 30.8 pg (ref 26.0–34.0)
MCHC: 32.6 g/dL (ref 30.0–36.0)
MCV: 94.3 fL (ref 80.0–100.0)
Monocytes Absolute: 0.6 10*3/uL (ref 0.1–1.0)
Monocytes Relative: 7 %
Neutro Abs: 5.8 10*3/uL (ref 1.7–7.7)
Neutrophils Relative %: 67 %
Platelets: 332 10*3/uL (ref 150–400)
RBC: 4.06 MIL/uL — ABNORMAL LOW (ref 4.22–5.81)
RDW: 13.2 % (ref 11.5–15.5)
WBC: 8.6 10*3/uL (ref 4.0–10.5)
nRBC: 0 % (ref 0.0–0.2)

## 2021-12-03 LAB — MAGNESIUM: Magnesium: 2.1 mg/dL (ref 1.7–2.4)

## 2021-12-03 MED ORDER — THIAMINE HCL 100 MG PO TABS
100.0000 mg | ORAL_TABLET | Freq: Every day | ORAL | Status: DC
  Administered 2021-12-04 – 2022-01-26 (×52): 100 mg via ORAL
  Filled 2021-12-03 (×57): qty 1

## 2021-12-03 MED ORDER — FOLIC ACID 1 MG PO TABS
1.0000 mg | ORAL_TABLET | Freq: Every day | ORAL | Status: DC
  Administered 2021-12-04 – 2022-01-26 (×52): 1 mg via ORAL
  Filled 2021-12-03 (×54): qty 1

## 2021-12-03 MED ORDER — THIAMINE HCL 100 MG/ML IJ SOLN
100.0000 mg | Freq: Every day | INTRAMUSCULAR | Status: DC
  Administered 2021-12-03: 100 mg via INTRAVENOUS
  Filled 2021-12-03: qty 2

## 2021-12-03 NOTE — Progress Notes (Signed)
PROGRESS NOTE    Adam [name]  [id number] DOB: November 25, 1961 DOA: 07/20/2021 PCP: Merryl Hacker, No    Brief Narrative:  61 year old male with past medical history significant for schizophrenia, CVA, traumatic brain injury, who was initially admitted to [facility] ER on July 20, 2021.  Patient has spent more than 123 days and then ED awaiting psychiatric placement as he was initially brought to the ED for violent outburst at his group home.  Due to difficulty in placing him into a facility, patient essentially been living in the ED.  Patient was IVC it initially.  On 11/20/2021, patient was noted be lethargic and found to have an NSTEMI.  Cardiology was consulted.  Admitted to the hospitalist service.  After admission to the medical service patient became agitated on the floor.  Wandering behavior.  Going into the patient's rooms.  Security called number of times with seem to exacerbate the situation.  Patient was eventually transferred to ICU for Precedex infusion.  PCCM was reengaged.  Multiple reengagement of psychiatry.  Precedex felt to be ineffective and was turned off.  Patient remains difficult to redirect.  Yelling out intermittently.  No aggressive or violent behaviors noted.  No disposition plan.  Senior management involved.  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  Seen by cardiology likely inferior infarct and wall motion abnormality noted on echocardiogram.  Was started on a heparin drip x48 hours and discontinued.  Cardiology  recommended a conservative approach given his extensive psychiatric history and inability to follow commands.  Beta-blocker not added due to hypotension. --Atorvastatin 40 mg p.o. daily --Aspirin 81 mg p.o. 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 nightly -- Phenobarbital 130 mg 3 times daily   EPS: Cogentin 0.5 mg p.o. twice daily   Acute urinary retention Noted on 12/26 after being sedated; and out catheterization with 900 mils of urine in place.  Continued with retention and had Foley catheter placed 12/26. --Tamsulosin 0.4 mg p.o. daily --Can attempt voiding trial 12/04/2021   Acute pulmonary edema: Resolved Chest x-ray with mild cardiomegaly, diffuse interstitial opacities suggesting pulmonary edema.  COVID-19 and influenza A/B PCR negative.  Currently oxygenating well on room air.   Poor oral intake --Continue to encourage increased oral intake, limited by his agitation/behavioral disturbances -- DC IVF, has been running since 12/26   DVT prophylaxis: SQ Lovenox Code Status: DNR Family Communication: None today Disposition Plan: Status is: Inpatient  Remains inpatient appropriate because: Unsafe discharge plan.  Very challenging disposition.  Patient has not been hospitalized for 135 days     Level of care: Med-Surg  Consultants:  Psychiatry  Procedures:  None  Antimicrobials: None   Subjective: Patient seen and examined.  Yelling out intermittently.  Keeps saying "over there".  Unable to redirect  Objective: Vitals:   12/03/21 0800 12/03/21  0900 12/03/21 1000 12/03/21 1100  BP: 95/71     Pulse: (!) 103 97 98   Resp: 13 16 15  (!) 21  Temp:      TempSrc:       SpO2: 98% 98% 98%   Weight:      Height:        Intake/Output Summary (Last 24 hours) at 12/03/2021 1319 Last data filed at 12/03/2021 0630 Gross per 24 hour  Intake 732.53 ml  Output 850 ml  Net -117.47 ml   Filed Weights   07/20/21 1635  Weight: 79.4 kg    Examination:  General exam: No acute distress.  Appears chronically ill Respiratory system: Lungs clear.  Normal work of breathing.  Room air Cardiovascular system: S1-S2, RRR, no murmurs, no pedal edema Gastrointestinal system: Soft, NT/ND, normal bowel sounds Central nervous system: Alert, oriented to person only Extremities: Symmetric 5 x 5 power. Skin: No rashes, lesions or ulcers Psychiatry: Judgement and insight appear impaired. Mood & affect confused.     Data Reviewed: I have personally reviewed following labs and imaging studies  CBC: Recent Labs  Lab 11/28/21 0432 12/01/21 0442 12/03/21 0835  WBC 8.8 9.2 8.6  NEUTROABS  --   --  5.8  HGB 13.4 12.2* 12.5*  HCT 40.3 36.1* 38.3*  MCV 92.2 91.4 94.3  PLT 379 409* [id number]   Basic Metabolic Panel: Recent Labs  Lab 11/28/21 0432 12/01/21 0442 12/03/21 0835  NA 143 145 134*  K 4.3 3.6 3.9  CL 110 108 104  CO2 24 24 22   GLUCOSE 104* 97 120*  BUN 25* 19 15  CREATININE 0.98 1.21 1.04  CALCIUM 8.8* 8.7* 8.7*  MG 2.0  --  2.1   GFR: Estimated Creatinine Clearance: 72.2 mL/min (by C-G formula based on SCr of 1.04 mg/dL). Liver Function Tests: No results for input(s): AST, ALT, ALKPHOS, BILITOT, PROT, ALBUMIN in the last 168 hours. No results for input(s): LIPASE, AMYLASE in the last 168 hours. No results for input(s): AMMONIA in the last 168 hours. Coagulation Profile: No results for input(s): INR, PROTIME in the last 168 hours. Cardiac Enzymes: No results for input(s): CKTOTAL, CKMB, CKMBINDEX, TROPONINI in the last 168 hours. BNP (last 3 results) No results for input(s): PROBNP in the last 8760 hours. HbA1C: No results for input(s): HGBA1C in the  last 72 hours. CBG: Recent Labs  Lab 11/27/21 1936  GLUCAP 99   Lipid Profile: No results for input(s): CHOL, HDL, LDLCALC, TRIG, CHOLHDL, LDLDIRECT in the last 72 hours. Thyroid Function Tests: No results for input(s): TSH, T4TOTAL, FREET4, T3FREE, THYROIDAB in the last 72 hours. Anemia Panel: No results for input(s): VITAMINB12, FOLATE, FERRITIN, TIBC, IRON, RETICCTPCT in the last 72 hours. Sepsis Labs: No results for input(s): PROCALCITON, LATICACIDVEN in the last 168 hours.  Recent Results (from the past 240 hour(s))  MRSA Next Gen by PCR, Nasal     Status: None   Collection Time: 11/27/21  4:55 AM   Specimen: Nasal Mucosa; Nasal Swab  Result Value Ref Range Status   MRSA by PCR Next Gen NOT DETECTED NOT DETECTED Final    Comment: (NOTE) The GeneXpert MRSA Assay (FDA approved for NASAL specimens only), is one component of a comprehensive MRSA colonization surveillance program. It is not intended to diagnose MRSA infection nor to guide or monitor treatment for MRSA infections. Test performance is not FDA approved in patients less than 21 years old. Performed at [facility], [place],  [place], [place] [address]          Radiology Studies: No results found.      Scheduled Meds:  aspirin EC  81 mg Oral Daily   atorvastatin  40 mg Oral Daily   benztropine  0.5 mg Oral BID   enoxaparin (LOVENOX) injection  40 mg Subcutaneous [id number]   folic acid  1 mg Oral Daily   gabapentin  300 mg Oral TID   haloperidol  5 mg Oral TID   mouth rinse  15 mL Mouth Rinse BID   multivitamin with minerals  1 tablet Oral Daily   PHENObarbital  130 mg Intravenous TID   tamsulosin  0.4 mg Oral QPC supper   temazepam  15 mg Oral QHS   thiamine  100 mg Oral Daily   Continuous Infusions:   LOS: 13 days    Time spent: 25 minutes    Sidney Ace, MD [facility]   If 7PM-7AM, please contact night-coverage  12/03/2021, 1:19 PM

## 2021-12-04 DIAGNOSIS — F209 Schizophrenia, unspecified: Secondary | ICD-10-CM | POA: Diagnosis not present

## 2021-12-04 MED ORDER — POLYETHYLENE GLYCOL 3350 17 G PO PACK
17.0000 g | PACK | Freq: Two times a day (BID) | ORAL | Status: DC
  Administered 2021-12-04 – 2022-02-18 (×86): 17 g via ORAL
  Filled 2021-12-04 (×119): qty 1

## 2021-12-04 MED ORDER — BISACODYL 10 MG RE SUPP: 10.0000 mg | Freq: Every day | RECTAL | Status: DC | PRN

## 2021-12-04 MED ORDER — PHENOBARBITAL SODIUM 65 MG/ML IJ SOLN
65.0000 mg | Freq: Three times a day (TID) | INTRAMUSCULAR | Status: AC
  Administered 2021-12-04 – 2021-12-05 (×6): 65 mg via INTRAVENOUS
  Filled 2021-12-04 (×6): qty 1

## 2021-12-04 MED ORDER — ASPIRIN 81 MG PO CHEW
81.0000 mg | CHEWABLE_TABLET | Freq: Every day | ORAL | Status: DC
  Administered 2021-12-04 – 2022-03-18 (×100): 81 mg via ORAL
  Filled 2021-12-04 (×106): qty 1

## 2021-12-04 NOTE — Progress Notes (Signed)
OT Cancellation Note  Patient Details Name: Blayde Bacigalupi MRN: [id number] DOB: [birth year]   Cancelled Treatment:    Reason Eval/Treat Not Completed: Fatigue/lethargy limiting ability to participate. Order received and chart reviewed. Attempted x3 this date however pt sleeping soundly, RN requesting not to wake pt. RN and NT report pt ambulated x3 laps (>400 ft) this AM with no AD and Supervision. Will re-attempt next date as able.   Kathie Dike, M.S. OTR/L  12/04/21, 1:33 PM  ascom [phone]

## 2021-12-04 NOTE — Plan of Care (Signed)
°  Problem: Education: Goal: Knowledge of General Education information will improve Description: Including pain rating scale, medication(s)/side effects and non-pharmacologic comfort measures Outcome: Progressing   Problem: Health Behavior/Discharge Planning: Goal: Ability to manage health-related needs will improve Outcome: Progressing   Problem: Clinical Measurements: Goal: Ability to maintain clinical measurements within normal limits will improve Outcome: Progressing Goal: Will remain free from infection Outcome: Progressing Goal: Diagnostic test results will improve Outcome: Progressing Goal: Respiratory complications will improve Outcome: Progressing Goal: Cardiovascular complication will be avoided Outcome: Progressing   Problem: Activity: Goal: Risk for activity intolerance will decrease Outcome: Progressing   Problem: Nutrition: Goal: Adequate nutrition will be maintained Outcome: Progressing   Problem: Coping: Goal: Level of anxiety will decrease Outcome: Progressing   Problem: Elimination: Goal: Will not experience complications related to bowel motility Outcome: Progressing Goal: Will not experience complications related to urinary retention Outcome: Progressing   Problem: Pain Managment: Goal: General experience of comfort will improve Outcome: Progressing   Problem: Safety: Goal: Ability to remain free from injury will improve Outcome: Progressing   Problem: Skin Integrity: Goal: Risk for impaired skin integrity will decrease Outcome: Progressing  Patient has a Air cabin crew at bedside.

## 2021-12-04 NOTE — Progress Notes (Signed)
°   12/04/21 1440  Clinical Encounter Type  Visited With Patient;Health care provider  Visit Type Follow-up  Spiritual Encounters  Spiritual Needs Other (Comment) (social support)   Adam [name] visited with Pt and sitter. Pt recognized the chaplain and seemed to be in good spirits. Was sitting up in bed eating and sitter indicated Pt had opportunity to ambulate earlier. Chaplain Burris expressed appreciation to this sensitive and patient sitter. Chaplain also related to staff in [place] that Pt is doing well because his care team down there asked about him. Will continue to follow.

## 2021-12-04 NOTE — Progress Notes (Signed)
PROGRESS NOTE    Adam [name]  UKG:[id number] DOB: 24-Aug-1961 DOA: 07/20/2021 PCP: Oneita Hurt, No    Brief Narrative:  61 year old male with past medical history significant for schizophrenia, CVA, traumatic brain injury, who was initially admitted to [facility] ER on July 20, 2021.  Patient has spent more than 123 days and then ED awaiting psychiatric placement as he was initially brought to the ED for violent outburst at his group home.  Due to difficulty in placing him into a facility, patient essentially been living in the ED.  Patient was IVC it initially.  On 11/20/2021, patient was noted be lethargic and found to have an NSTEMI.  Cardiology was consulted.  Admitted to the hospitalist service.  After admission to the medical service patient became agitated on the floor.  Wandering behavior.  Going into the patient's rooms.  Security called number of times with seem to exacerbate the situation.  Patient was eventually transferred to ICU for Precedex infusion.  PCCM was reengaged.  Multiple reengagement of psychiatry.  Precedex felt to be ineffective and was turned off.  Patient remains difficult to redirect.  Yelling out intermittently.  No aggressive or violent behaviors noted.  No disposition plan.  Senior management involved.  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  Seen by cardiology likely inferior infarct and wall motion abnormality noted on echocardiogram.  Was started on a heparin drip x48 hours and discontinued.  Cardiology  recommended a conservative approach given his extensive psychiatric history and inability to follow commands.  Beta-blocker not added due to hypotension. --Atorvastatin 40 mg p.o. daily --Aspirin 81 mg p.o. 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 nightly --Wean phenobarbital 1/2.  Dose decreased with pharmacy assistance   EPS: Cogentin 0.5 mg p.o. twice daily   Acute urinary retention Noted on 12/26 after being sedated; and out catheterization with 900 mils of urine in place.  Continued with retention and had Foley catheter placed 12/26. --Tamsulosin 0.4 mg p.o. daily --Can attempt voiding trial 12/04/2021   Acute pulmonary edema: Resolved Chest x-ray with mild cardiomegaly, diffuse interstitial opacities suggesting pulmonary edema.  COVID-19 and influenza A/B PCR negative.  Currently oxygenating well on room air.   Poor oral intake --Continue to encourage increased oral intake, limited by his agitation/behavioral disturbances -- DC IVF, has been running since 12/26   DVT prophylaxis: SQ Lovenox Code Status: DNR Family Communication: None today Disposition Plan: Status is: Inpatient  Remains inpatient appropriate because: Unsafe discharge plan.  Very challenging disposition.  Patient has been hospitalized for 136 days    Level of care: Med-Surg  Consultants:  Psychiatry  Procedures:  None  Antimicrobials: None   Subjective: Patient seen and examined.  Yelling out intermittently but not combative.  Objective: Vitals:   12/04/21 0040 12/04/21 0645 12/04/21 0700 12/04/21 7628  BP: 101/84 126/88 121/86 121/86  Pulse: (!) 114 96 98   Resp:  18 18   Temp:  99 F (37.2  C)    TempSrc:  Oral    SpO2: 95% 96%  95%  Weight:      Height:        Intake/Output Summary (Last 24 hours) at 12/04/2021 1354 Last data filed at 12/04/2021 1149 Gross per 24 hour  Intake 1200 ml  Output 3550 ml  Net -2350 ml   Filed Weights   07/20/21 1635  Weight: 79.4 kg    Examination:  General exam: No acute distress.  Appears chronically ill Respiratory system: Lungs clear.  Normal work of breathing.  Room air Cardiovascular system: S1-S2, RRR, no murmurs, no pedal edema Gastrointestinal system: Soft, NT/ND, normal bowel sounds Central nervous system: Alert, oriented to person only Extremities: Symmetric 5 x 5 power. Skin: No rashes, lesions or ulcers Psychiatry: Judgement and insight appear impaired. Mood & affect confused.     Data Reviewed: I have personally reviewed following labs and imaging studies  CBC: Recent Labs  Lab 11/28/21 0432 12/01/21 0442 12/03/21 0835  WBC 8.8 9.2 8.6  NEUTROABS  --   --  5.8  HGB 13.4 12.2* 12.5*  HCT 40.3 36.1* 38.3*  MCV 92.2 91.4 94.3  PLT 379 409* 332   Basic Metabolic Panel: Recent Labs  Lab 11/28/21 0432 12/01/21 0442 12/03/21 0835  NA 143 145 134*  K 4.3 3.6 3.9  CL 110 108 104  CO2 24 24 22   GLUCOSE 104* 97 120*  BUN 25* 19 15  CREATININE 0.98 1.21 1.04  CALCIUM 8.8* 8.7* 8.7*  MG 2.0  --  2.1   GFR: Estimated Creatinine Clearance: 72.2 mL/min (by C-G formula based on SCr of 1.04 mg/dL). Liver Function Tests: No results for input(s): AST, ALT, ALKPHOS, BILITOT, PROT, ALBUMIN in the last 168 hours. No results for input(s): LIPASE, AMYLASE in the last 168 hours. No results for input(s): AMMONIA in the last 168 hours. Coagulation Profile: No results for input(s): INR, PROTIME in the last 168 hours. Cardiac Enzymes: No results for input(s): CKTOTAL, CKMB, CKMBINDEX, TROPONINI in the last 168 hours. BNP (last 3 results) No results for input(s): PROBNP in the last 8760 hours. HbA1C: No results for  input(s): HGBA1C in the last 72 hours. CBG: Recent Labs  Lab 11/27/21 1936  GLUCAP 99   Lipid Profile: No results for input(s): CHOL, HDL, LDLCALC, TRIG, CHOLHDL, LDLDIRECT in the last 72 hours. Thyroid Function Tests: No results for input(s): TSH, T4TOTAL, FREET4, T3FREE, THYROIDAB in the last 72 hours. Anemia Panel: No results for input(s): VITAMINB12, FOLATE, FERRITIN, TIBC, IRON, RETICCTPCT in the last 72 hours. Sepsis Labs: No results for input(s): PROCALCITON, LATICACIDVEN in the last 168 hours.  Recent Results (from the past 240 hour(s))  MRSA Next Gen by PCR, Nasal     Status: None   Collection Time: 11/27/21  4:55 AM   Specimen: Nasal Mucosa; Nasal Swab  Result Value Ref Range Status   MRSA by PCR Next Gen NOT DETECTED NOT DETECTED Final    Comment: (NOTE) The GeneXpert MRSA Assay (FDA approved for NASAL specimens only), is one component of a comprehensive MRSA colonization surveillance program. It is not intended to diagnose MRSA infection nor to guide or monitor treatment for MRSA infections. Test performance is not FDA approved in patients less than 37 years old. Performed at [facility], [address] [place], [place] [address]  Radiology Studies: No results found.      Scheduled Meds:  aspirin  81 mg Oral Daily   atorvastatin  40 mg Oral Daily   benztropine  0.5 mg Oral BID   enoxaparin (LOVENOX) injection  40 mg Subcutaneous Q24H   folic acid  1 mg Oral Daily   gabapentin  300 mg Oral TID   haloperidol  5 mg Oral TID   mouth rinse  15 mL Mouth Rinse BID   multivitamin with minerals  1 tablet Oral Daily   PHENObarbital  65 mg Intravenous TID   polyethylene glycol  17 g Oral BID   tamsulosin  0.4 mg Oral QPC supper   temazepam  15 mg Oral QHS   thiamine  100 mg Oral Daily   Continuous Infusions:   LOS: 14 days    Time spent: 15 minutes    Tresa Moore, MD [facility]   If 7PM-7AM, please contact  night-coverage  12/04/2021, 1:54 PM

## 2021-12-04 NOTE — Consult Note (Signed)
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 male patient admitted with Adam [name] is a 61 y.o.admitted to medical service from [facility] after having MI, 11/20/2021. Adam [name] is well-known to this Probation officer, having been in the [place] since Aug 18,2022, awaiting group home placement. Patient has mental health/neuro diagnoses as above. Marland Kitchen  HPI: Patient was seen face-to-face in his room today.  He was awake, alert.  Sitting in a recliner with sitter in room.  Patient was calm.  Patient was the calmest I have seen him since he has been off of the ED psychiatry unit.  He did smile when I approached him.  He was not shouting.  Patient's nurse brought him a carton of Belk and his medication with which he took.  Patient's nurse told me that they ambulated together around the unit.  Patient got tired and then was able to sit in a wheelchair and continue moving around the unit in the wheelchair.  He then got into the recliner.  Where he is seeming to be very comfortable.  No new recommendations today.  Past Psychiatric History: see previous  Risk to Self:   Risk to Others:   Prior Inpatient Therapy:   Prior Outpatient Therapy:    Past Medical History:  Past Medical History:  Diagnosis Date   Myocardial  infarction ([place])    Schizophrenia ([place])    Stroke ([place])    TBI (traumatic brain injury)    History reviewed. No pertinent surgical history. Family History: History reviewed. No pertinent family history. Family Psychiatric  History: see previous Social History:  Social History   Substance and Sexual Activity  Alcohol Use None     Social History   Substance and Sexual Activity  Drug Use Not on file    Social History   Socioeconomic History   Marital status: Single    Spouse name: Not on file   Number of children: Not on file   Years of education: Not on file   Highest education level: Not on file  Occupational History   Not on file  Tobacco Use   Smoking status: Unknown   Smokeless tobacco: Not on file  Vaping Use   Vaping Use: Unknown  Substance and Sexual Activity   Alcohol use: Not on file   Drug use: Not on file   Sexual activity: Not on file  Other Topics Concern   Not on file  Social History Narrative   Not on file   Social Determinants of Health   Financial Resource Strain: Not on file  Food Insecurity: Not on file  Transportation Needs: Not on file  Physical Activity: Not on file  Stress: Not on file  Social Connections: Not on file   Additional Social History:    Allergies:   Allergies  Allergen Reactions   Morphine And Related    Nsaids  Labs:  Results for orders placed or performed during the hospital encounter of 07/20/21 (from the past 48 hour(s))  CBC with Differential/Platelet     Status: Abnormal   Collection Time: 12/03/21  8:35 AM  Result Value Ref Range   WBC 8.6 4.0 - 10.5 K/uL   RBC 4.06 (L) 4.22 - 5.81 MIL/uL   Hemoglobin 12.5 (L) 13.0 - 17.0 g/dL   HCT 38.3 (L) 39.0 - 52.0 %   MCV 94.3 80.0 - 100.0 fL   MCH 30.8 26.0 - 34.0 pg   MCHC 32.6 30.0 - 36.0 g/dL   RDW 13.2 11.5 - 15.5 %   Platelets 332 150 - 400 K/uL   nRBC 0.0 0.0 - 0.2 %   Neutrophils Relative % 67 %   Neutro Abs 5.8 1.7 - 7.7 K/uL   Lymphocytes Relative  20 %   Lymphs Abs 1.7 0.7 - 4.0 K/uL   Monocytes Relative 7 %   Monocytes Absolute 0.6 0.1 - 1.0 K/uL   Eosinophils Relative 4 %   Eosinophils Absolute 0.3 0.0 - 0.5 K/uL   Basophils Relative 1 %   Basophils Absolute 0.1 0.0 - 0.1 K/uL   Immature Granulocytes 1 %   Abs Immature Granulocytes 0.08 (H) 0.00 - 0.07 K/uL    Comment: Performed at [facility] [address] [place], [place] [id number]  Basic metabolic panel     Status: Abnormal   Collection Time: 12/03/21  8:35 AM  Result Value Ref Range   Sodium 134 (L) 135 - 145 mmol/L   Potassium 3.9 3.5 - 5.1 mmol/L   Chloride 104 98 - 111 mmol/L   CO2 22 22 - 32 mmol/L   Glucose, Bld 120 (H) 70 - 99 mg/dL    Comment: Glucose reference range applies only to samples taken after fasting for at least 8 hours.   BUN 15 8 - 23 mg/dL   Creatinine, Ser 1.04 0.61 - 1.24 mg/dL   Calcium 8.7 (L) 8.9 - 10.3 mg/dL   GFR, Estimated >60 >60 mL/min    Comment: (NOTE) Calculated using the CKD-EPI Creatinine Equation (2021)    Anion gap 8 5 - 15    Comment: Performed at [facility], [address], [place], [place] [address]  Magnesium     Status: None   Collection Time: 12/03/21  8:35 AM  Result Value Ref Range   Magnesium 2.1 1.7 - 2.4 mg/dL    Comment: Performed at [facility], [address], [place], [place] [address]    Current Facility-Administered Medications  Medication Dose Route Frequency Provider Last Rate Last Admin   acetaminophen (TYLENOL) tablet 650 mg  650 mg Oral Q4H PRN Kristopher Oppenheim, DO   650 mg at 12/01/21 2211   aspirin chewable tablet 81 mg  81 mg Oral Daily Ralene Muskrat B, MD   81 mg at 12/04/21 1449   atorvastatin (LIPITOR) tablet 40 mg  40 mg Oral Daily Kathlyn Sacramento A, MD   40 mg at 12/03/21 2136   benztropine (COGENTIN) tablet 0.5 mg  0.5 mg Oral BID Kristopher Oppenheim, DO   0.5 mg at 12/04/21 [id number]   bisacodyl (DULCOLAX) suppository 10 mg  10 mg Rectal Daily PRN Ralene Muskrat B, MD        diphenhydrAMINE (BENADRYL) capsule 50 mg  50 mg Oral Q6H PRN Waldon Merl F, NP       Or   diphenhydrAMINE (BENADRYL) injection 50 mg  50 mg Intramuscular Q6H  PRN Sherlon Handing, NP   50 mg at 12/04/21 1828   enoxaparin (LOVENOX) injection 40 mg  40 mg Subcutaneous Q24H Oswald Hillock, RPH   40 mg at 12/03/21 2137   famotidine (PEPCID) tablet 20 mg  20 mg Oral BID PRN Kristopher Oppenheim, DO   20 mg at 0000000 [id number]   folic acid (FOLVITE) tablet 1 mg  1 mg Oral Daily Ralene Muskrat B, MD   1 mg at 12/04/21 [id number]   gabapentin (NEURONTIN) capsule 300 mg  300 mg Oral TID Kristopher Oppenheim, DO   300 mg at 12/04/21 1612   haloperidol (HALDOL) tablet 5 mg  5 mg Oral TID [place] Eric J, DO   5 mg at 12/04/21 1612   haloperidol (HALDOL) tablet 5 mg  5 mg Oral Q6H PRN Sherlon Handing, NP   5 mg at 12/02/21 2100   Or   haloperidol lactate (HALDOL) injection 5 mg  5 mg Intramuscular Q6H PRN Sherlon Handing, NP   5 mg at 12/04/21 1828   LORazepam (ATIVAN) injection 1 mg  1 mg Intravenous Q4H PRN Kristopher Oppenheim, DO   1 mg at 12/04/21 0011   LORazepam (ATIVAN) injection 2 mg  2 mg Intramuscular Q6H PRN Enzo Bi, MD   2 mg at 12/02/21 2026   MEDLINE mouth rinse  15 mL Mouth Rinse BID Enzo Bi, MD   15 mL at 12/04/21 [id number]   multivitamin with minerals tablet 1 tablet  1 tablet Oral Daily Kristopher Oppenheim, DO   1 tablet at 12/04/21 [id number]   nitroGLYCERIN (NITROSTAT) SL tablet 0.4 mg  0.4 mg Sublingual Q5 Min x 3 PRN Kristopher Oppenheim, DO       ondansetron [facility]) injection 4 mg  4 mg Intravenous Q6H PRN Kristopher Oppenheim, DO       PHENObarbital (LUMINAL) injection 65 mg  65 mg Intravenous TID Ralene Muskrat B, MD   65 mg at 12/04/21 1612   polyethylene glycol (MIRALAX / GLYCOLAX) packet 17 g  17 g Oral BID Ralene Muskrat B, MD   17 g at 12/04/21 1449   tamsulosin (FLOMAX) capsule 0.4 mg  0.4 mg Oral QPC supper Tyler Pita, MD   0.4 mg at 12/03/21 1705   temazepam (RESTORIL) capsule 15 mg  15 mg Oral QHS Kristopher Oppenheim,  DO   15 mg at 12/03/21 2136   thiamine tablet 100 mg  100 mg Oral Daily Ralene Muskrat B, MD   100 mg at 12/04/21 [id number]    Musculoskeletal: Strength & Muscle Tone: within normal limits Gait & Station: normal Patient leans: N/A            Psychiatric Specialty Exam:  Presentation  General Appearance: Disheveled  Eye Contact:None  Speech:Clear and Coherent  Speech Volume:Increased  Handedness:Right   Mood and Affect  Mood:Anxious; Irritable  Affect:Congruent   Thought Process  Thought Processes:Disorganized  Descriptions of Associations:Loose  Orientation:Partial  Thought Content:Scattered; Illogical  History of Schizophrenia/Schizoaffective disorder:Yes  Duration of Psychotic Symptoms:Greater than six months  Hallucinations:No data recorded Ideas of Reference:-- (UTA)  Suicidal Thoughts:No data recorded Homicidal Thoughts:No data recorded  Sensorium  Memory:Remote Poor; Recent Poor; Immediate Poor; Other (comment) (impaired memory and cognition)  Judgment:Impaired  Insight:Lacking   Executive Functions  Concentration:Poor  Attention Span:Poor  Recall:Poor  Fund of Knowledge:Poor  Language:Poor   Psychomotor Activity  Psychomotor Activity:No data recorded  Assets  Assets:Housing; Resilience; Social Support; Talents/Skills   Sleep  Sleep:No data recorded  Physical  Exam: Physical Exam ROS Blood pressure (!) 120/94, pulse 98, temperature 98 F (36.7 C), temperature source Axillary, resp. rate 16, height 5\' 8"  (1.727 m), weight 79.4 kg, SpO2 98 %. Body mass index is 26.61 kg/m.  Treatment Plan Summary:Treatment Plan Summary: Patient is a 61 year old male with above stated history. He was admitted to ED in Aug 2022, after violent behavior at group home. He has been waiting in the ED for appropriate placement since that time, and on Nov 20, 2021 was found to have NSTEMI. He was admitted to the medical unit. He is currently in  the ICU, after moved there and put on Precedex.  Recommend: Haldol should not be given IV. Avoid Geodon. PRNs for agitation: Haldol 5 mg PO or IM every 6 hours with Benadryl 50 mg po or IM.  Patient has not demonstrated aggressive behavior today.  At time of this writer's visit, he was calm and cooperative.  It is helpful for patient to be able to ambulate or sit in chair.   Disposition:  Patient does not meet criteria for inpatient psychiatric treatment. He requires residential placement when medically clear     Sherlon Handing, NP 12/04/2021 6:53 PM

## 2021-12-05 DIAGNOSIS — F209 Schizophrenia, unspecified: Secondary | ICD-10-CM | POA: Diagnosis not present

## 2021-12-05 MED ORDER — LORAZEPAM 1 MG PO TABS
1.0000 mg | ORAL_TABLET | Freq: Three times a day (TID) | ORAL | Status: DC
  Administered 2021-12-07 – 2022-01-21 (×132): 1 mg via ORAL
  Filled 2021-12-05 (×135): qty 1

## 2021-12-05 MED ORDER — PHENOBARBITAL SODIUM 65 MG/ML IJ SOLN
32.5000 mg | Freq: Three times a day (TID) | INTRAMUSCULAR | Status: DC
  Filled 2021-12-05: qty 1

## 2021-12-05 NOTE — Progress Notes (Signed)
PT Cancellation Note  Patient Details Name: Adam [name] MRN: [id number] DOB: [birth year]   Cancelled Treatment:    Reason Eval/Treat Not Completed: PT screened, no needs identified, will sign off. Per report (and PT saw pt walking with tech), he was able to ambulate several laps around the unit with supervision, pt has been up and moving when able this admission. PT to sign off due to no acute PT needs noted.    Lieutenant Diego PT, DPT 8:13 AM,12/05/21
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
## 2021-12-06 ENCOUNTER — Inpatient Hospital Stay: Payer: Medicare HMO

## 2021-12-06 DIAGNOSIS — F209 Schizophrenia, unspecified: Secondary | ICD-10-CM | POA: Diagnosis not present

## 2021-12-06 MED ORDER — PHENOBARBITAL 32.4 MG PO TABS
32.4000 mg | ORAL_TABLET | Freq: Once | ORAL | Status: AC
  Administered 2021-12-06: 32.4 mg via ORAL
  Filled 2021-12-06: qty 1

## 2021-12-06 MED ORDER — PHENOBARBITAL 32.4 MG PO TABS
32.4000 mg | ORAL_TABLET | Freq: Three times a day (TID) | ORAL | Status: DC
  Administered 2021-12-06 – 2022-01-21 (×135): 32.4 mg via ORAL
  Filled 2021-12-06 (×138): qty 1
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
## 2021-12-06 NOTE — Progress Notes (Signed)
°   12/06/21 0010  What Happened  Was fall witnessed? Yes  Who witnessed fall? Tiffany Mainord, Sitter  Patients activity before fall ambulating-unassisted  Point of contact head;buttocks (right head and buttocks)  Was patient injured? No (pending CT head)  Follow Up  MD notified Dr. Toniann Fail  Time MD notified [phone]  Family notified Yes - comment  Time family notified 0812  Additional tests Yes-comment (CT of head)  Progress note created (see row info) Yes  Adult Fall Risk Assessment  Risk Factor Category (scoring not indicated) High fall risk per protocol (document High fall risk)  Patient Fall Risk Level High fall risk  Adult Fall Risk Interventions  Required Bundle Interventions *See Row Information* High fall risk - low, moderate, and high requirements implemented  Additional Interventions Safety Sitter/Safety Rounder  Screening for Fall Injury Risk (To be completed on HIGH fall risk patients) - Assessing Need for Floor Mats  Risk For Fall Injury- Criteria for Floor Mats Confusion/dementia (+NuDESC, CIWA, TBI, etc.)  Will Implement Floor Mats Yes  PAINAD (Pain Assessment in Advanced Dementia)  Breathing 0  Negative Vocalization 0  Facial Expression 0  Body Language 0  Consolability 0  PAINAD Score 0  Neurological  Neuro (WDL) X  Level of Consciousness Alert  Orientation Level Disoriented to place;Disoriented to person;Disoriented to time;Disoriented to situation  Cognition Poor attention/concentration;Poor judgement;Poor safety awareness;Follows commands  Speech Developmetally delayed  Glasgow Coma Scale  Eye Opening 4  Modified Verbal Response (INTUBATED) 3  Best Motor Response 6  Glasgow Coma Scale Score 13  Musculoskeletal  Musculoskeletal (WDL) X  Assistive Device None  Generalized Weakness Yes  Weight Bearing Restrictions No  Integumentary  Integumentary (WDL) X  Skin Color Appropriate for ethnicity  Skin Condition Dry  Ecchymosis Location Arm (old  bruises)  Ecchymosis Location Orientation Bilateral  Ecchymosis Intervention Other (Comment) (assessed)

## 2021-12-06 NOTE — Progress Notes (Signed)
Patient foley d/c.  Patient tolerated well. Sitter at bedside informed patient due to void and urinal given.

## 2021-12-06 NOTE — Progress Notes (Signed)
[facility] reports patient with slide off bed striking head on   . At baseline neuro function. Head CT ordered rule out hemorrhage. Request bed closer to nurses station from nursing staff

## 2021-12-06 NOTE — TOC Progression Note (Signed)
Transition of Care [facility]) - Progression Note    Patient Details  Name: Hilmar Moldovan MRN: [id number] Date of Birth: Oct 12, 1961  [facility]) CM/SW Contact  Maree Krabbe, LCSW Phone Number: 12/06/2021, 12:51 PM  Clinical Narrative:   Olin Hauser with [facility] left a vm for CSW explaining they are working on a group home placement for pt in [place].    Expected Discharge Plan: Group Home Barriers to Discharge: Continued Medical Work up  Expected Discharge Plan and Services Expected Discharge Plan: Group Home In-house Referral: Clinical Social Work     Living arrangements for the past 2 months: Group Home                                       Social Determinants of Health (SDOH) Interventions    Readmission Risk Interventions No flowsheet data found.

## 2021-12-06 NOTE — Progress Notes (Signed)
At bedside for IV consult. CNA at bedside with patient combative and screaming. Unsafe to attempt placing a PIV at this time. Will make primary RN aware.

## 2021-12-06 NOTE — Progress Notes (Signed)
Patient fell and hit right side of head and buttocks on nightstand around 0000, was coming back from bathroom, sat down in bed and was not on bed far enough. sitter was trying to redirect and patient slid of bed. Did not lose consciousness per sitter, BP: 110/82, P: 113, R: 18, and O2 94 RA. Is alert and repeating the "tent" Helped up and is laying in bed more calm now. Skin assessed and intact, no bruises or hematomas noted. Dr. Hal Hope notified. CT of head ordered. Patient is down getting CT now.

## 2021-12-07 ENCOUNTER — Inpatient Hospital Stay: Payer: Medicare HMO

## 2021-12-07 ENCOUNTER — Encounter: Payer: Self-pay | Admitting: Family Medicine

## 2021-12-07 DIAGNOSIS — F209 Schizophrenia, unspecified: Secondary | ICD-10-CM | POA: Diagnosis not present

## 2021-12-07 NOTE — Progress Notes (Signed)
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 He has spent more than 123 days in the [facility] ED awaiting psychiatric placement. He was initially brought to [facility] ER due to violent outbursts at his group home. He was IVC'd initially.  Due to difficulty in placing him into facility, pt has essentially been living in the [facility] ER.  He was noted to be lethargic 12/19, found to have NSTEMI.  Pt then admitted to hospitalist service.  Cardiology consulted who recommended no intervention.    After admission to the medical service patient became agitated on the floor.  Wandering behavior, going into the patient's rooms.  Security called number of times which seemed to exacerbate the situation.  Multiple reengagement of psychiatry with no further recommendation and refusal to accept pt to inpatient psych service.  Senior management involved.  Night covering provider transferred pt to ICU night of 12/24 for precedex gtt due to severe agitation and combative behavior.  PCCM was consulted.  Precedex felt to be ineffective and was turned off.  Pt was transferred back to floor on 1/4. Patient remains difficult to redirect.  Yelling out intermittently.  No aggressive or violent behaviors noted. TOC working on disposition.   Cognitive and neurobehavioral dysfunction following brain  injury Schizophrenia, chronic with acute exacerbation Agitated delirium --Haldol 5 mg PO 3 times daily --Benadryl 50 mg p.o./IM every 6 hours as needed agitation --Ativan IV/IM as needed --Temazepam 15 mg p.o. nightly --cont phenobarbital as oral   EPS: Cogentin 0.5 mg p.o. twice daily   NSTEMI Patient with elevated high sensitive troponin with Q waves on EKG.  Seen by cardiology likely inferior infarct and wall motion abnormality noted on echocardiogram.  Was started on a heparin drip x48 hours and discontinued.  Cardiology recommended a conservative approach given his extensive psychiatric history and inability to follow commands.  Beta-blocker not added due to hypotension. --Atorvastatin 40 mg p.o. daily --Aspirin 81 mg p.o. daily  Acute urinary retention Noted on 12/26 after being sedated; and out catheterization with 900 mils of urine in place.  Continued with retention and had Foley catheter placed 12/26. --Tamsulosin 0.4 mg p.o. daily --d/c Foley today   Acute pulmonary edema: Resolved Chest x-ray with mild cardiomegaly, diffuse interstitial opacities suggesting pulmonary edema.  COVID-19 and influenza A/B PCR negative.  Currently oxygenating well on room air.   Poor oral intake --Continue to encourage increased oral intake, limited by his agitation/behavioral disturbances --No further IVF    DVT prophylaxis: Lovenox SQ Code Status: DNR  Family Communication: attempted to call mother today, no answer  Level of care: Med-Surg Dispo:   The patient is from: group home Anticipated d/c is to: undetermined Anticipated d/c date is: undetermined  Patient currently is medically ready to d/c. But no safe disposition.   Subjective and Interval History:  Overnight, per nursing, pt had  pt slipped off of the bed and on to the floor and then fell back and "bumped his head" on bedside table.  No apparent injury.   Objective: Vitals:   12/07/21 0154 12/07/21 0300 12/07/21 0410  12/07/21 0811  BP: 140/81  139/70 136/89  Pulse: 97  91 94  Resp: 16  16 17   Temp: 98.2 F (36.8 C)  98.6 F (37 C) 97.7 F (36.5 C)  TempSrc:      SpO2: 90% 97% 97% 100%  Weight:      Height:        Intake/Output Summary (Last 24 hours) at 12/07/2021 1759 Last data filed at 12/06/2021 2054 Gross per 24 hour  Intake 120 ml  Output --  Net 120 ml   Filed Weights   07/20/21 1635  Weight: 79.4 kg    Examination:   Constitutional: NAD, alert, sitting in recliner, talking on the phone with his mother HEENT: conjunctivae and lids normal, EOMI CV: No cyanosis.   RESP: normal respiratory effort, on RA Neuro: II - XII grossly intact.     Data Reviewed: I have personally reviewed following labs and imaging studies  CBC: Recent Labs  Lab 12/01/21 0442 12/03/21 0835  WBC 9.2 8.6  NEUTROABS  --  5.8  HGB 12.2* 12.5*  HCT 36.1* 38.3*  MCV 91.4 94.3  PLT 409* 332   Basic Metabolic Panel: Recent Labs  Lab 12/01/21 0442 12/03/21 0835  NA 145 134*  K 3.6 3.9  CL 108 104  CO2 24 22  GLUCOSE 97 120*  BUN 19 15  CREATININE 1.21 1.04  CALCIUM 8.7* 8.7*  MG  --  2.1   GFR: Estimated Creatinine Clearance: 72.2 mL/min (by C-G formula based on SCr of 1.04 mg/dL). Liver Function Tests: No results for input(s): AST, ALT, ALKPHOS, BILITOT, PROT, ALBUMIN in the last 168 hours. No results for input(s): LIPASE, AMYLASE in the last 168 hours. No results for input(s): AMMONIA in the last 168 hours. Coagulation Profile: No results for input(s): INR, PROTIME in the last 168 hours. Cardiac Enzymes: No results for input(s): CKTOTAL, CKMB, CKMBINDEX, TROPONINI in the last 168 hours. BNP (last 3 results) No results for input(s): PROBNP in the last 8760 hours. HbA1C: No results for input(s): HGBA1C in the last 72 hours. CBG: No results for input(s): GLUCAP in the last 168 hours. Lipid Profile: No results for input(s): CHOL, HDL, LDLCALC, TRIG, CHOLHDL, LDLDIRECT in the last 72  hours. Thyroid Function Tests: No results for input(s): TSH, T4TOTAL, FREET4, T3FREE, THYROIDAB in the last 72 hours. Anemia Panel: No results for input(s): VITAMINB12, FOLATE, FERRITIN, TIBC, IRON, RETICCTPCT in the last 72 hours. Sepsis Labs: No results for input(s): PROCALCITON, LATICACIDVEN in the last 168 hours.  No results found for this or any previous visit (from the past 240 hour(s)).     Radiology Studies: CT HEAD WO CONTRAST (01/31/22)  Result Date: 12/07/2021 CLINICAL DATA:  Fall, head injury. Head trauma, abnormal mental status (Age 23-64y). EXAM: CT HEAD WITHOUT CONTRAST TECHNIQUE: Contiguous axial images were obtained from the base of the skull through the vertex without intravenous contrast. COMPARISON:  12/06/2021 FINDINGS: Brain: Extensive left frontal and parietal encephalomalacia is again identified in keeping with remote infarct. No acute intracranial hemorrhage or infarct. No abnormal mass effect or midline shift. No abnormal intra or extra-axial mass lesion. Ventricular size is normal. Cerebellum is unremarkable. Vascular: No hyperdense vessel or unexpected calcification. Skull: Normal. Negative for fracture or focal lesion. Sinuses/Orbits: The  paranasal sinuses are clear. The orbits are unremarkable. Other: Mastoid air cells and middle ear cavities are clear. IMPRESSION: No acute intracranial abnormality.  Stable encephalomalacia. Electronically Signed   By: Helyn Numbers M.D.   On: 12/07/2021 00:38   CT HEAD WO CONTRAST ( )  Result Date: 12/06/2021 CLINICAL DATA:  Fall, head injury.  Altered mental status. EXAM: CT HEAD WITHOUT CONTRAST TECHNIQUE: Contiguous axial images were obtained from the base of the skull through the vertex without intravenous contrast. COMPARISON:  11/20/2021 FINDINGS: Brain: Extensive left frontal and parietal cortical encephalomalacia is again noted in keeping with prior infarction. No evidence of acute intracranial hemorrhage or infarct. No abnormal  mass effect or midline shift. No abnormal intra or extra-axial mass lesion. Ventricular size is normal. Cerebellum is unremarkable. Vascular: No hyperdense vessel or unexpected calcification. Skull: Normal. Negative for fracture or focal lesion. Sinuses/Orbits: No acute finding. Other: Mastoid air cells and middle ear cavities are clear. IMPRESSION: No acute intracranial abnormality.  Stable encephalomalacia. Electronically Signed   By: Helyn Numbers M.D.   On: 12/06/2021 01:23     Scheduled Meds:  aspirin  81 mg Oral Daily   atorvastatin  40 mg Oral Daily   benztropine  0.5 mg Oral BID   enoxaparin (LOVENOX) injection  40 mg Subcutaneous Q24H   folic acid  1 mg Oral Daily   gabapentin  300 mg Oral TID   haloperidol  5 mg Oral TID   LORazepam  1 mg Oral TID   mouth rinse  15 mL Mouth Rinse BID   multivitamin with minerals  1 tablet Oral Daily   phenobarbital  32.4 mg Oral TID   polyethylene glycol  17 g Oral BID   tamsulosin  0.4 mg Oral QPC supper   temazepam  15 mg Oral QHS   thiamine  100 mg Oral Daily   Continuous Infusions:   LOS: 17 days     Darlin Priestly, MD [facility] If 7PM-7AM, please contact night-coverage 12/07/2021, 5:59 PM

## 2021-12-07 NOTE — Progress Notes (Signed)
This patient would not allow this nurse to assess him with or without stethoscope. Patient keeps screaming "no" when this nurse approaches him. Will continue to attempt.

## 2021-12-07 NOTE — Progress Notes (Signed)
Pt noted to be extremely anxious and agitated during shift change. Yelling and shouting, appears to be hallucinated, keeps pointing at doorware, shouting "no Jewish!". Pt up out of bed and unsteady, unable to be redirected by shift. Per previous shift report, pt has been calm throughout most of the day. Ativan injection given by previous shift with assistance of myself and other team members. Pt allowed injection once back in bed. Will monitor for effectiveness.

## 2021-12-07 NOTE — Progress Notes (Signed)
Pt less anxious and agitated at present. Pt ambulated with sitter several times throughout unit. Received bath and gown changed. Took all po medications, refused miralax and lovenox injection. Continues to sporadically yell out, but less frequent at this time. Will continue to monitor.

## 2021-12-07 NOTE — Progress Notes (Signed)
°   12/06/21 2300  What Happened  Was fall witnessed? Yes  Who witnessed fall? Joanna Mann-Nursing tech  Patients activity before fall other (comment) (sitting along side bed)  Point of contact buttocks;other (comment) (then leaned back and hit head)  Was patient injured? No  Follow Up  MD notified San Jetty and Manuela Schwartz, NP  Time MD notified 2302  Family notified Yes - comment  Time family notified 2310  Additional tests Yes-comment  Simple treatment  (n/a)  Progress note created (see row info) Yes  Adult Fall Risk Assessment  Risk Factor Category (scoring not indicated) Fall has occurred during this admission (document High fall risk);High fall risk per protocol (document High fall risk)  Age 61  Fall History: Fall within 6 months prior to admission 0  Elimination; Bowel and/or Urine Incontinence 2  Elimination; Bowel and/or Urine Urgency/Frequency 0  Medications: includes PCA/Opiates, Anti-convulsants, Anti-hypertensives, Diuretics, Hypnotics, Laxatives, Sedatives, and Psychotropics 5  Patient Care Equipment 0  Mobility-Assistance 2  Mobility-Gait 2  Mobility-Sensory Deficit 0  Altered awareness of immediate physical environment 1  Impulsiveness 2  Lack of understanding of one's physical/cognitive limitations 4  Total Score 19  Patient Fall Risk Level High fall risk  Adult Fall Risk Interventions  Required Bundle Interventions *See Row Information* High fall risk - low, moderate, and high requirements implemented  Additional Interventions Reorient/diversional activities with confused patients;Safety Sitter/Safety Rounder;Use of appropriate toileting equipment (bedpan, BSC, etc.)  Screening for Fall Injury Risk (To be completed on HIGH fall risk patients) - Assessing Need for Floor Mats  Risk For Fall Injury- Criteria for Floor Mats Confusion/dementia (+NuDESC, CIWA, TBI, etc.);Noncompliant with safety precautions;Previous fall this admission  Will Implement Floor  Mats Yes  Pain Assessment  Pain Scale 0-10  Pain Score 0  PAINAD (Pain Assessment in Advanced Dementia)  Breathing 0  Negative Vocalization 0  Facial Expression 0  Body Language 0  Consolability 0  PAINAD Score 0  Neurological  Neuro (WDL) X  Level of Consciousness Alert  Orientation Level Disoriented to person;Disoriented to place;Disoriented to time;Disoriented to situation  Cognition Impulsive;Poor attention/concentration;Poor judgement;Poor safety awareness;Developmentally delayed  Speech Developmetally delayed  Pupil Assessment   (refused)  R Hand Grip Strong  L Hand Grip Strong  Neuro Symptoms Agitation;Anxiety  Neuro symptoms relieved by Rest  Glasgow Coma Scale  Eye Opening 4  Best Verbal Response (NON-intubated) 4  Modified Verbal Response (INTUBATED) 3  Best Motor Response 6  Glasgow Coma Scale Score (!) 17  Musculoskeletal  Musculoskeletal (WDL) X  Assistive Device None  Generalized Weakness Yes  Integumentary  Integumentary (WDL) X  Skin Color Appropriate for ethnicity  Skin Condition Dry  Skin Integrity Cracking  Cracking Location Heel  Cracking Location Orientation Left  Ecchymosis Location Arm (previously present)  Ecchymosis Location Orientation Bilateral  Ecchymosis Intervention  (assessed)

## 2021-12-07 NOTE — Progress Notes (Signed)
Pt had a witnessed fall, floor matts in place at time of fall. Pt already one to one for safety due to aggressive behaviors, staff was in room. Per CNA, pt slipped off of the bed and on to the floor and then fell back and "bumped his head" on bedside table. Denies pain. No injuries noted. Pt uncooperative with full neuro check, confused at baseline. Moving all extremities without difficulty. No visible injuries. Provider on call notified. Awaiting CT scan of head.
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
## 2021-12-08 DIAGNOSIS — F209 Schizophrenia, unspecified: Secondary | ICD-10-CM | POA: Diagnosis not present

## 2021-12-08 MED ORDER — METOPROLOL SUCCINATE ER 25 MG PO TB24
25.0000 mg | ORAL_TABLET | Freq: Every day | ORAL | Status: DC
  Administered 2021-12-08 – 2022-03-18 (×94): 25 mg via ORAL
  Filled 2021-12-08 (×102): qty 1
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
## 2021-12-08 NOTE — Progress Notes (Signed)
Mobility Specialist - Progress Note   12/08/21 1600  Mobility  Activity Ambulated in hall  Level of Assistance Standby assist, set-up cues, supervision of patient - no hands on  Assistive Device Other (Comment) (HHA)  Distance Ambulated (ft) 750 ft  Mobility Ambulated with assistance in hallway  Mobility Response Tolerated well  Mobility performed by Mobility specialist  $Mobility charge 1 Mobility    Pt ambulated > 500' with HHA. Pt left EOB with sitter present.    Kathee Delton Mobility Specialist 12/08/21, 4:24 PM

## 2021-12-09 DIAGNOSIS — F209 Schizophrenia, unspecified: Secondary | ICD-10-CM | POA: Diagnosis not present
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
## 2021-12-09 NOTE — Plan of Care (Signed)
°  Problem: Health Behavior/Discharge Planning: Goal: Ability to manage health-related needs will improve Outcome: Not Progressing   Problem: Clinical Measurements: Goal: Ability to maintain clinical measurements within normal limits will improve Outcome: Progressing Goal: Will remain free from infection Outcome: Progressing Goal: Diagnostic test results will improve Outcome: Progressing   Problem: Activity: Goal: Risk for activity intolerance will decrease Outcome: Progressing   Problem: Nutrition: Goal: Adequate nutrition will be maintained Outcome: Progressing   Problem: Coping: Goal: Level of anxiety will decrease Outcome: Progressing   Problem: Elimination: Goal: Will not experience complications related to bowel motility Outcome: Progressing Goal: Will not experience complications related to urinary retention Outcome: Progressing   Problem: Pain Managment: Goal: General experience of comfort will improve Outcome: Progressing   Problem: Safety: Goal: Ability to remain free from injury will improve Outcome: Progressing   Problem: Skin Integrity: Goal: Risk for impaired skin integrity will decrease Outcome: Progressing

## 2021-12-10 DIAGNOSIS — F209 Schizophrenia, unspecified: Secondary | ICD-10-CM | POA: Diagnosis not present

## 2021-12-10 NOTE — Plan of Care (Signed)
  Problem: Nutrition: Goal: Adequate nutrition will be maintained Outcome: Progressing   
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
## 2021-12-11 DIAGNOSIS — F209 Schizophrenia, unspecified: Secondary | ICD-10-CM | POA: Diagnosis not present
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
## 2021-12-11 NOTE — TOC Progression Note (Signed)
Transition of Care [facility]) - Progression Note    Patient Details  Name: Robbert Langlinais MRN: [id number] Date of Birth: [name] 16, 1961  [facility]) CM/SW Contact  Maree Krabbe, LCSW Phone Number: 12/11/2021, 1:31 PM  Clinical Narrative:   CSW spoke with [name] with [facility]. He states to call Babs Sciara at [facility] they may have a lead on placement for pt. However, [facility] is a SNF. CSW spoke with Loraine Leriche and this will not be a option at this time because pt does not have a skillable need and does not have Medicaid that covers LTC. Loraine Leriche agrees SNF would not be a option. CSW called [name] back and he understands and states he is still working with [facility] (a group home) on possibly taking the pt. However, the concern is they are requesting a 7 day trail and [name] has explained once pt is dc he is not coming back to [facility]. Therefore [name] is discussing the option with them if the need be they would send to their local hospital. However, they are hesitant. [name] states he is going to keep communicating with them.  [name] is also working with [employer] in order to get the funding.    Expected Discharge Plan: Group Home Barriers to Discharge: Continued Medical Work up  Expected Discharge Plan and Services Expected Discharge Plan: Group Home In-house Referral: Clinical Social Work     Living arrangements for the past 2 months: Group Home                                       Social Determinants of Health (SDOH) Interventions    Readmission Risk Interventions No flowsheet data found.
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
## 2021-12-12 DIAGNOSIS — F209 Schizophrenia, unspecified: Secondary | ICD-10-CM | POA: Diagnosis not present
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
## 2021-12-12 NOTE — Progress Notes (Signed)
Mobility Specialist - Progress Note   12/12/21 1000  Mobility  Activity Ambulated in hall  Range of Motion/Exercises Active  Level of Assistance Standby assist, set-up cues, supervision of patient - no hands on  Assistive Device None  Distance Ambulated (ft) 500 ft  Mobility Ambulated with assistance in hallway  Mobility Response Tolerated well  Mobility performed by Mobility specialist  $Mobility charge 1 Mobility     Pt ambulated in hallway with supervision. Upon return to room, pt picked 2 ADL tasks to complete today (comb hair, wash face). Cues for initiating participation to comb hair, unsuccessful, but pt did engage in face washing at sink. Pt left in chair with sitter present.   Kathee Delton Mobility Specialist 12/12/21, 10:44 AM

## 2021-12-13 DIAGNOSIS — J810 Acute pulmonary edema: Secondary | ICD-10-CM | POA: Diagnosis not present

## 2021-12-13 DIAGNOSIS — I214 Non-ST elevation (NSTEMI) myocardial infarction: Secondary | ICD-10-CM | POA: Diagnosis not present

## 2021-12-13 DIAGNOSIS — G3189 Other specified degenerative diseases of nervous system: Secondary | ICD-10-CM | POA: Diagnosis not present

## 2021-12-13 DIAGNOSIS — F209 Schizophrenia, unspecified: Secondary | ICD-10-CM | POA: Diagnosis not present

## 2021-12-13 NOTE — Progress Notes (Signed)
Mobility Specialist - Progress Note   12/13/21 1600  Mobility  Activity Ambulated in hall  Level of Assistance Standby assist, set-up cues, supervision of patient - no hands on  Assistive Device None  Distance Ambulated (ft) 660 ft  Mobility Ambulated with assistance in hallway  Mobility Response Tolerated well  Mobility performed by Mobility specialist  $Mobility charge 1 Mobility    Pt ambulated > 500' in hallway with supervision. Pt left in bed with sitter present.    Filiberto Pinks Mobility Specialist 12/13/21, 4:27 PM

## 2021-12-13 NOTE — Progress Notes (Signed)
OT Cancellation Note  Patient Details Name: Adam [name] MRN: [id number] DOB: [birth year]   Cancelled Treatment:    Reason Eval/Treat Not Completed: Other (comment) Pt sleeping upon OT arrival and sitter reporting she'd just gotten him to sleep after several bouts of ambulation this AM. Will f/u for OT tx at later date/time as able/pt appropriate. Thank you.  Rejeana Brock, MS, OTR/L ascom [phone] 12/13/21, 1:02 PM
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
## 2021-12-14 DIAGNOSIS — F209 Schizophrenia, unspecified: Secondary | ICD-10-CM | POA: Diagnosis not present

## 2021-12-14 DIAGNOSIS — J810 Acute pulmonary edema: Secondary | ICD-10-CM | POA: Diagnosis not present

## 2021-12-14 DIAGNOSIS — I214 Non-ST elevation (NSTEMI) myocardial infarction: Secondary | ICD-10-CM | POA: Diagnosis not present

## 2021-12-14 NOTE — Progress Notes (Signed)
Mobility Specialist - Progress Note   12/14/21 1100  Mobility  Activity Ambulated in hall  Level of Assistance Standby assist, set-up cues, supervision of patient - no hands on  Assistive Device None  Distance Ambulated (ft) 600 ft  Mobility Ambulated with assistance in hallway  Mobility Response Tolerated well  Mobility performed by Mobility specialist  $Mobility charge 1 Mobility    Pt ambulated in hallway with supervision. In good spirits, but a little guarded this date---trying to avoid hallways with staff. Upon return to room, pt asked to select an ADL task to complete today as mobility read aloud options listed. Pt chose music, but then opted for watching a movie. Pt left in chair with sitter at bedside.    Adam [name] Mobility Specialist 12/14/21, 11:20 AM

## 2021-12-14 NOTE — Progress Notes (Signed)
PROGRESS NOTE    Adam [name]  NWG:[id number] DOB: Oct 11, 1961 DOA: 07/20/2021 PCP: Oneita Hurt, No    Brief Narrative:  Adam [name] is a 61 yo WM with hx of schizophrenia, CVA, significant brain injury, who was initially admitted to [facility] ER on July 20, 2021. He has spent more than 123 days in the [facility] ED awaiting psychiatric placement. He was initially brought to [facility] ER due to violent outbursts at his group home. He was IVC'd initially.  Due to difficulty in placing him into facility, pt has essentially been living in the [facility] ER.  He was noted to be lethargic 12/19, found to have NSTEMI.  Pt then admitted to hospitalist service.  Cardiology consulted who recommended no intervention.     After admission to the medical service patient became agitated on the floor.  Wandering behavior, going into the patient's rooms.  Security called number of times which seemed to exacerbate the situation.  Multiple reengagement of psychiatry with no further recommendation and refusal to accept pt to inpatient psych service.  Senior management involved.  Night covering provider transferred pt to ICU night of 12/24 for precedex gtt due to severe agitation and combative behavior.  PCCM was consulted.  Precedex felt to be ineffective and was turned off.  Pt was transferred back to floor on 1/4. Patient remains difficult to redirect.   Patient condition appears to be more stable for the last 2 days.   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 3 times a day.  Scheduled phenobarbital.  Cogentin 0.5 mg twice a day for EPS.  Patient became more agitated today after he woke up from sleep.  We will try to avoid waking him up.  Currently his conditions are stable.  Pending placement.  Non-STEMI. No intervention for cardiology.  Continue beta-blocker, aspirin and the atorvastatin. No change in treatment plan.  Acute urinary tension. Resolved  Acute pulmonary edema secondary to acute on chronic diastolic congestive heart failure. Ejection fraction 45 to 50% on most recent echocardiogram. Condition has resolved.     DVT prophylaxis: Lovenox  Code Status: DNR Family Communication:  Disposition Plan:      Status is: Inpatient   Remains inpatient appropriate because: Unsafe discharge.      I/O last 3 completed shifts: In: 236 [P.O.:236] Out: -  No intake/output data recorded.     Subjective: Patient was agitated and angry as he was woke up from his sleep. No short of breath or cough No abdominal pain or nausea vomiting.  Good appetite.  Objective: Vitals:   12/12/21 1608 12/13/21 0902 12/13/21 1255 12/13/21 2256  BP: 105/73 117/82 115/81 121/85  Pulse: 92 90 86 88  Resp: 18 17 17 15   Temp: 98.5 F (36.9 C) 98.6 F (37 C) 98.3 F (36.8 C) 98 F (36.7 C)  TempSrc:  Oral  Oral  SpO2: 99% 99% 98%   Weight:      Height:  No intake or output data in the 24 hours ending 12/14/21 1229 Filed Weights   07/20/21 1635 12/07/21 2025  Weight: 79.4 kg 80 kg    Examination:  General exam: Appears calm and comfortable  Respiratory system: Clear to auscultation. Respiratory effort normal. Cardiovascular system: S1 & S2 heard, RRR. No JVD, murmurs, rubs, gallops or clicks. No pedal edema. Gastrointestinal system: Abdomen is nondistended, soft and nontender. No organomegaly  or masses felt. Normal bowel sounds heard. Central nervous system: Alert and oriented x1. No focal neurological deficits. Extremities: Symmetric 5 x 5 power. Skin: No rashes, lesions or ulcers .     Data Reviewed: I have personally reviewed following labs and imaging studies  CBC: No results for input(s): WBC, NEUTROABS, HGB, HCT, MCV, PLT in the last 168 hours. Basic Metabolic Panel: No results for input(s): NA, K, CL, CO2, GLUCOSE, BUN, CREATININE, CALCIUM, MG, PHOS in the last 168 hours. GFR: Estimated Creatinine Clearance: 72.2 mL/min (by C-G formula based on SCr of 1.04 mg/dL). Liver Function Tests: No results for input(s): AST, ALT, ALKPHOS, BILITOT, PROT, ALBUMIN in the last 168 hours. No results for input(s): LIPASE, AMYLASE in the last 168 hours. No results for input(s): AMMONIA in the last 168 hours. Coagulation Profile: No results for input(s): INR, PROTIME in the last 168 hours. Cardiac Enzymes: No results for input(s): CKTOTAL, CKMB, CKMBINDEX, TROPONINI in the last 168 hours. BNP (last 3 results) No results for input(s): PROBNP in the last 8760 hours. HbA1C: No results for input(s): HGBA1C in the last 72 hours. CBG: No results for input(s): GLUCAP in the last 168 hours. Lipid Profile: No results for input(s): CHOL, HDL, LDLCALC, TRIG, CHOLHDL, LDLDIRECT in the last 72 hours. Thyroid Function Tests: No results for input(s): TSH, T4TOTAL, FREET4, T3FREE, THYROIDAB in the last 72 hours. Anemia Panel: No results for input(s): VITAMINB12, FOLATE, FERRITIN, TIBC, IRON, RETICCTPCT in the last 72 hours. Sepsis Labs: No results for input(s): PROCALCITON, LATICACIDVEN in the last 168 hours.  No results found for this or any previous visit (from the past 240 hour(s)).       Radiology Studies: No results found.      Scheduled Meds:  aspirin  81 mg Oral Daily   atorvastatin  40 mg Oral Daily   benztropine  0.5 mg Oral BID   enoxaparin (LOVENOX) injection  40  mg Subcutaneous Q24H   folic acid  1 mg Oral Daily   gabapentin  300 mg Oral TID   haloperidol  5 mg Oral TID   LORazepam  1 mg Oral TID   mouth rinse  15 mL Mouth Rinse BID   metoprolol succinate  25 mg Oral Daily   multivitamin with minerals  1 tablet Oral Daily   phenobarbital  32.4 mg Oral TID   polyethylene glycol  17 g Oral BID   temazepam  15 mg Oral QHS   thiamine  100 mg Oral Daily   Continuous Infusions:   LOS: 24 days    Time spent: 24 minutes    Marrion Coy, MD [facility]   To contact the attending provider between 7A-7P or the covering provider during after hours 7P-7A, please log into the web site [email or web address] and access using universal [place] password for that web site. If you do not have the password, please call the hospital operator.  12/14/2021, 12:29 PM

## 2021-12-14 NOTE — TOC Progression Note (Signed)
Transition of Care [facility]) - Progression Note    Patient Details  Name: Adam [name] MRN: [id number] Date of Birth: [birth year]  [facility] CM/SW Contact  Maree Krabbe, LCSW Phone Number: 12/14/2021, 2:39 PM  Clinical Narrative:   CSW spoke with [name] with [facility], to check to see if there any updates. [facility] (Group Home in [place]) has accepted pt. Now [name] is working on funding through [employer]. [name] is also working with pt's Mother for approval and transport details. [name] states he will contact CSW as soon as he knows something.     Expected Discharge Plan: Group Home Barriers to Discharge: Continued Medical Work up  Expected Discharge Plan and Services Expected Discharge Plan: Group Home In-house Referral: Clinical Social Work     Living arrangements for the past 2 months: Group Home                                       Social Determinants of Health (SDOH) Interventions    Readmission Risk Interventions No flowsheet data found.

## 2021-12-15 DIAGNOSIS — I214 Non-ST elevation (NSTEMI) myocardial infarction: Secondary | ICD-10-CM | POA: Diagnosis not present

## 2021-12-15 DIAGNOSIS — F209 Schizophrenia, unspecified: Secondary | ICD-10-CM | POA: Diagnosis not present

## 2021-12-15 MED ORDER — LORAZEPAM 2 MG/ML IJ SOLN
2.0000 mg | Freq: Once | INTRAMUSCULAR | Status: AC
  Administered 2021-12-15: 2 mg via INTRAMUSCULAR

## 2021-12-15 MED ORDER — ZIPRASIDONE MESYLATE 20 MG IM SOLR
20.0000 mg | Freq: Once | INTRAMUSCULAR | Status: AC
  Administered 2021-12-15: 20 mg via INTRAMUSCULAR
  Filled 2021-12-15: qty 20

## 2021-12-15 NOTE — Progress Notes (Signed)
Cross Cover Patient with escalating agitation and combativeness throughout evening. No unable to redirect and combative requiring security assistance. Already had prescribed scheduled and prn medications. Dos of geodon ordered

## 2021-12-15 NOTE — Progress Notes (Addendum)
Mobility Specialist - Progress Note   12/15/21 1600  Mobility  Activity Ambulated in hall  Level of Assistance Standby assist, set-up cues, supervision of patient - no hands on  Assistive Device None  Distance Ambulated (ft) 200 ft  Mobility Ambulated with assistance in hallway  Mobility Response Tolerated well  Mobility performed by Mobility specialist  $Mobility charge 1 Mobility    Pt ambulated hallway with supervision. Upon return to room, pt became increasingly agitated and began yelling out profanity towards other staff. Difficult to redirect---following commands occassionally. RN at bedside.   Adam [name] Mobility Specialist 12/15/21, 4:13 PM

## 2021-12-15 NOTE — Progress Notes (Signed)
PROGRESS NOTE    Ewald Ducker  [id number] DOB: June 17, 1961 DOA: 07/20/2021 PCP: Merryl Hacker, No    Brief Narrative:  Adam [name] is a 61 yo WM with hx of schizophrenia, CVA, significant brain injury, who was initially admitted to [facility] ER on July 20, 2021. He has spent more than 123 days in the [facility] ED awaiting psychiatric placement. He was initially brought to [facility] ER due to violent outbursts at his group home. He was IVC'd initially.  Due to difficulty in placing him into facility, pt has essentially been living in the [facility] ER.  He was noted to be lethargic 12/19, found to have NSTEMI.  Pt then admitted to hospitalist service.  Cardiology consulted who recommended no intervention.     After admission to the medical service patient became agitated on the floor.  Wandering behavior, going into the patient's rooms.  Security called number of times which seemed to exacerbate the situation.  Multiple reengagement of psychiatry with no further recommendation and refusal to accept pt to inpatient psych service.  Senior management involved.  Night covering provider transferred pt to ICU night of 12/24 for precedex gtt due to severe agitation and combative behavior.  PCCM was consulted.  Precedex felt to be ineffective and was turned off.  Pt was transferred back to floor on 1/4. Patient remains difficult to redirect.   Patient condition is more stable, currently pending for placement.   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 3 times a day.  Scheduled phenobarbital.  Cogentin 0.5 mg twice a day for EPS.  Condition stabilized, no agitation.  Good appetite.  Just pending for placement.  Non-STEMI. No intervention for cardiology.  Continue beta-blocker, aspirin and the atorvastatin.   Acute urinary tension. Resolved  Acute pulmonary edema secondary to acute on chronic diastolic congestive heart failure. Ejection fraction 45 to 50% on most recent echocardiogram. Condition has resolved.       DVT prophylaxis: Lovenox  Code Status: DNR Family Communication:  Disposition Plan:      Status is: Inpatient   Remains inpatient appropriate because: Unsafe discharge.    I/O last 3 completed shifts: In: -  Out: 4 [Urine:2; Stool:2] No intake/output data recorded.    Subjective: Patient doing well today, currently slept well last night.  No agitation. No short of breath or cough.  Objective: Vitals:   12/14/21 1429 12/14/21 2038 12/15/21 0552 12/15/21 0931  BP: 107/75 119/80 124/73 122/74  Pulse: 79 82 80 84  Resp: 18 18 18    Temp: (!) 97.5 F (36.4 C) 98 F (36.7 C) 98 F (36.7 C) (!) 97.3 F (36.3 C)  TempSrc: Oral  Oral Oral  SpO2:  100% 100% 99%  Weight:      Height:        Intake/Output Summary (Last 24 hours) at 12/15/2021 1038 Last data filed at 12/15/2021 0553 Gross per 24 hour  Intake --  Output 4 ml  Net -4 ml   Filed Weights   07/20/21 1635 12/07/21 2025  Weight: 79.4 kg 80 kg    Examination:  General exam: Appears calm and comfortable  Respiratory system: Clear to auscultation. Respiratory effort normal. Cardiovascular system: S1 & S2 heard, RRR. No JVD, murmurs, rubs, gallops or clicks. No pedal edema. Gastrointestinal system: Abdomen is nondistended, soft and nontender. No organomegaly or masses felt. Normal bowel sounds  heard. Central nervous system: Alert and oriented x1. No focal neurological deficits. Extremities: Symmetric 5 x 5 power. Skin: No rashes, lesions or ulcers      Data Reviewed: I have personally reviewed following labs and imaging studies  CBC: No results for input(s): WBC, NEUTROABS, HGB, HCT, MCV, PLT in the last 168 hours. Basic Metabolic Panel: No results for input(s): NA, K, CL, CO2, GLUCOSE, BUN, CREATININE, CALCIUM, MG, PHOS in the last 168 hours. GFR: Estimated Creatinine Clearance: 72.2 mL/min (by C-G formula based on SCr of 1.04 mg/dL). Liver Function Tests: No results for input(s): AST, ALT, ALKPHOS, BILITOT, PROT, ALBUMIN in the last 168 hours. No results for input(s): LIPASE, AMYLASE in the last 168 hours. No results for input(s): AMMONIA in the last 168 hours. Coagulation Profile: No results for input(s): INR, PROTIME in the last 168 hours. Cardiac Enzymes: No results for input(s): CKTOTAL, CKMB, CKMBINDEX, TROPONINI in the last 168 hours. BNP (last 3 results) No results for input(s): PROBNP in the last 8760 hours. HbA1C: No results for input(s): HGBA1C in the last 72 hours. CBG: No results for input(s): GLUCAP in the last 168 hours. Lipid Profile: No results for input(s): CHOL, HDL, LDLCALC, TRIG, CHOLHDL, LDLDIRECT in the last 72 hours. Thyroid Function Tests: No results for input(s): TSH, T4TOTAL, FREET4, T3FREE, THYROIDAB in the last 72 hours. Anemia Panel: No results for input(s): VITAMINB12, FOLATE, FERRITIN, TIBC, IRON, RETICCTPCT in the last 72 hours. Sepsis Labs: No results for input(s): PROCALCITON, LATICACIDVEN in the last 168 hours.  No results found for this or any previous visit (from the past 240 hour(s)).       Radiology Studies: No results found.      Scheduled Meds:  aspirin  81 mg Oral Daily   atorvastatin  40 mg Oral Daily   benztropine  0.5 mg Oral BID   enoxaparin (LOVENOX) injection  40 mg Subcutaneous [id number]   folic acid  1  mg Oral Daily   gabapentin  300 mg Oral TID   haloperidol  5 mg Oral TID   LORazepam  1 mg Oral TID   mouth rinse  15 mL Mouth Rinse BID   metoprolol succinate  25 mg Oral Daily   multivitamin with minerals  1 tablet Oral Daily   phenobarbital  32.4 mg Oral TID   polyethylene glycol  17 g Oral BID   temazepam  15 mg Oral QHS   thiamine  100 mg Oral Daily   Continuous Infusions:   LOS: 25 days    Time spent: 20 minutes    Sharen Hones, MD [facility]   To contact the attending provider between 7A-7P or the covering provider during after hours 7P-7A, please log into the web site [email or web address] and access using universal [place] password for that web site. If you do not have the password, please call the hospital operator.  12/15/2021, 10:38 AM

## 2021-12-15 NOTE — Plan of Care (Signed)
°  Problem: Clinical Measurements: Goal: Ability to maintain clinical measurements within normal limits will improve Outcome: Progressing Goal: Will remain free from infection Outcome: Progressing Goal: Diagnostic test results will improve Outcome: Progressing   Problem: Nutrition: Goal: Adequate nutrition will be maintained Outcome: Progressing   Problem: Coping: Goal: Level of anxiety will decrease Outcome: Progressing   Problem: Elimination: Goal: Will not experience complications related to bowel motility Outcome: Progressing Goal: Will not experience complications related to urinary retention Outcome: Progressing

## 2021-12-16 DIAGNOSIS — G3189 Other specified degenerative diseases of nervous system: Secondary | ICD-10-CM | POA: Diagnosis not present

## 2021-12-16 DIAGNOSIS — F209 Schizophrenia, unspecified: Secondary | ICD-10-CM | POA: Diagnosis not present

## 2021-12-16 DIAGNOSIS — I214 Non-ST elevation (NSTEMI) myocardial infarction: Secondary | ICD-10-CM | POA: Diagnosis not present

## 2021-12-16 DIAGNOSIS — F09 Unspecified mental disorder due to known physiological condition: Secondary | ICD-10-CM | POA: Diagnosis not present

## 2021-12-16 MED ORDER — LORAZEPAM 2 MG/ML IJ SOLN: 2.0000 mg | Freq: Once | INTRAMUSCULAR | Status: DC

## 2021-12-16 NOTE — Progress Notes (Signed)
Mobility Specialist - Progress Note   12/16/21 1700  Mobility  Activity Ambulated in hall  Level of Assistance Standby assist, set-up cues, supervision of patient - no hands on  Assistive Device None  Distance Ambulated (ft) 500 ft  Mobility Ambulated with assistance in hallway  Mobility Response Tolerated well  Mobility performed by Mobility specialist  $Mobility charge 1 Mobility     Pt ambulated in hallway with supervision. A little more wobbly than usual, however no overt LOB. Per sitter, pt received meds not too long ago. Pt returned to EOB with sitter present.    Kathee Delton Mobility Specialist 12/16/21, 5:05 PM

## 2021-12-16 NOTE — Progress Notes (Signed)
Mobility Specialist - Progress Note   12/16/21 1000  Mobility  Activity Ambulated in hall  Range of Motion/Exercises Active  Level of Assistance Standby assist, set-up cues, supervision of patient - no hands on  Assistive Device None  Distance Ambulated (ft) 200 ft  Mobility Ambulated with assistance in room;Ambulated with assistance in hallway  Mobility Response Tolerated well  Mobility performed by Mobility specialist  $Mobility charge 1 Mobility    Pt seen ambulating hallway with sitter. Upon return to room, pt selected one ADL tasks from list (brush teeth). Pt brushed teeth ~ 8 minutes with cues for set up and sequencing. Pt left EOB with sitter present.    Filiberto Pinks Mobility Specialist 12/16/21, 11:25 AM

## 2021-12-16 NOTE — Progress Notes (Addendum)
PROGRESS NOTE    Adam [name]  [id number] DOB: 09/09/1961 DOA: 07/20/2021 PCP: Merryl Hacker, No    Brief Narrative:  Adam [name] is a 61 yo WM with hx of schizophrenia, CVA, significant brain injury, who was initially admitted to [facility] ER on July 20, 2021. He has spent more than 123 days in the [facility] ED awaiting psychiatric placement. He was initially brought to [facility] ER due to violent outbursts at his group home. He was IVC'd initially.  Due to difficulty in placing him into facility, pt has essentially been living in the [facility] ER.  He was noted to be lethargic 12/19, found to have NSTEMI.  Pt then admitted to hospitalist service.  Cardiology consulted who recommended no intervention.     After admission to the medical service patient became agitated on the floor.  Wandering behavior, going into the patient's rooms.  Security called number of times which seemed to exacerbate the situation.  Multiple reengagement of psychiatry with no further recommendation and refusal to accept pt to inpatient psych service.  Senior management involved.  Night covering provider transferred pt to ICU night of 12/24 for precedex gtt due to severe agitation and combative behavior.  PCCM was consulted.  Precedex felt to be ineffective and was turned off.  Pt was transferred back to floor on 1/4. Patient remains difficult to redirect.   Patient condition is more stable, currently pending for placement.   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 3 times a day.  Scheduled phenobarbital.  Cogentin 0.5 mg twice a day for EPS.  Patient is more agitated today, he was angry.  Otherwise no other symptoms.  No change in treatment plan.   Non-STEMI. No intervention for cardiology.  Continue beta-blocker, aspirin and the atorvastatin.    Acute urinary tension. Resolved  Acute pulmonary edema secondary to acute on chronic diastolic congestive heart failure. Ejection fraction 45 to 50% on most recent echocardiogram. Condition has resolved.       DVT prophylaxis: Lovenox  Code Status: DNR Family Communication: mother updated Disposition Plan:      Status is: Inpatient   Remains inpatient appropriate because: Unsafe discharge.     I/O last 3 completed shifts: In: 240 [P.O.:240] Out: 6 [Urine:4; Stool:2] No intake/output data recorded.    Subjective: Patient is angry today,.  Did not sleep well. Otherwise he has no other complaints.  Denies any short of breath or cough. No abdominal pain or nausea vomiting.  Objective: Vitals:   12/14/21 2038 12/15/21 0552 12/15/21 0931 12/15/21 1540  BP: 119/80 124/73 122/74 108/62  Pulse: 82 80 84 91  Resp: 18 18  18   Temp:  98 F (36.7 C) (!) 97.3 F (36.3 C) 97.9 F (36.6 C)  TempSrc:  Oral Oral   SpO2: 100% 100% 99% 100%  Weight:      Height:        Intake/Output Summary (Last 24 hours) at 12/16/2021  1148 Last data filed at 12/15/2021 2100 Gross per 24 hour  Intake 240 ml  Output 2 ml  Net 238 ml   Filed Weights   07/20/21 1635 12/07/21 2025  Weight: 79.4 kg 80 kg    Examination:  General exam: Appears calm and comfortable  Respiratory system: Clear to auscultation. Respiratory effort normal. Cardiovascular system: S1 & S2 heard, RRR. No JVD, murmurs, rubs, gallops or clicks. No pedal edema. Gastrointestinal system: Abdomen  is nondistended, soft and nontender. No organomegaly or masses felt. Normal bowel sounds heard. Central nervous system: Alert and oriented x1. No focal neurological deficits. Extremities: Symmetric 5 x 5 power. Skin: No rashes, lesions or ulcers     Data Reviewed: I have personally reviewed following labs and imaging studies  CBC: No results for input(s): WBC, NEUTROABS, HGB, HCT, MCV, PLT in the last 168 hours. Basic Metabolic Panel: No results for input(s): NA, K, CL, CO2, GLUCOSE, BUN, CREATININE, CALCIUM, MG, PHOS in the last 168 hours. GFR: Estimated Creatinine Clearance: 72.2 mL/min (by C-G formula based on SCr of 1.04 mg/dL). Liver Function Tests: No results for input(s): AST, ALT, ALKPHOS, BILITOT, PROT, ALBUMIN in the last 168 hours. No results for input(s): LIPASE, AMYLASE in the last 168 hours. No results for input(s): AMMONIA in the last 168 hours. Coagulation Profile: No results for input(s): INR, PROTIME in the last 168 hours. Cardiac Enzymes: No results for input(s): CKTOTAL, CKMB, CKMBINDEX, TROPONINI in the last 168 hours. BNP (last 3 results) No results for input(s): PROBNP in the last 8760 hours. HbA1C: No results for input(s): HGBA1C in the last 72 hours. CBG: No results for input(s): GLUCAP in the last 168 hours. Lipid Profile: No results for input(s): CHOL, HDL, LDLCALC, TRIG, CHOLHDL, LDLDIRECT in the last 72 hours. Thyroid Function Tests: No results for input(s): TSH, T4TOTAL, FREET4, T3FREE, THYROIDAB in the last 72 hours. Anemia Panel: No results for input(s): VITAMINB12, FOLATE, FERRITIN, TIBC, IRON, RETICCTPCT in the last 72 hours. Sepsis Labs: No results for input(s): PROCALCITON, LATICACIDVEN in the last 168 hours.  No results found for this or any previous visit (from the past 240 hour(s)).       Radiology Studies: No results found.      Scheduled Meds:  aspirin  81 mg Oral Daily   atorvastatin  40 mg Oral Daily   benztropine  0.5  mg Oral BID   enoxaparin (LOVENOX) injection  40 mg Subcutaneous [id number]   folic acid  1 mg Oral Daily   gabapentin  300 mg Oral TID   haloperidol  5 mg Oral TID   LORazepam  1 mg Oral TID   mouth rinse  15 mL Mouth Rinse BID   metoprolol succinate  25 mg Oral Daily   multivitamin with minerals  1 tablet Oral Daily   phenobarbital  32.4 mg Oral TID   polyethylene glycol  17 g Oral BID   temazepam  15 mg Oral QHS   thiamine  100 mg Oral Daily   Continuous Infusions:   LOS: 26 days    Time spent: 22 minutes    Sharen Hones, MD [facility]   To contact the attending provider between 7A-7P or the covering provider during after hours 7P-7A, please log into the web site [email or web address] and access using universal [place] password for that web site. If you do not have the password, please call the hospital operator.  12/16/2021, 11:48 AM

## 2021-12-17 DIAGNOSIS — G3189 Other specified degenerative diseases of nervous system: Secondary | ICD-10-CM | POA: Diagnosis not present

## 2021-12-17 DIAGNOSIS — I214 Non-ST elevation (NSTEMI) myocardial infarction: Secondary | ICD-10-CM | POA: Diagnosis not present

## 2021-12-17 DIAGNOSIS — F09 Unspecified mental disorder due to known physiological condition: Secondary | ICD-10-CM | POA: Diagnosis not present

## 2021-12-17 DIAGNOSIS — S069XAS Unspecified intracranial injury with loss of consciousness status unknown, sequela: Secondary | ICD-10-CM | POA: Diagnosis not present

## 2021-12-17 DIAGNOSIS — G259 Extrapyramidal and movement disorder, unspecified: Secondary | ICD-10-CM

## 2021-12-17 DIAGNOSIS — F209 Schizophrenia, unspecified: Secondary | ICD-10-CM | POA: Diagnosis not present
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
## 2021-12-17 NOTE — Progress Notes (Signed)
Sitter at bedside.

## 2021-12-18 DIAGNOSIS — I214 Non-ST elevation (NSTEMI) myocardial infarction: Secondary | ICD-10-CM | POA: Diagnosis not present

## 2021-12-18 DIAGNOSIS — S069XAS Unspecified intracranial injury with loss of consciousness status unknown, sequela: Secondary | ICD-10-CM | POA: Diagnosis not present

## 2021-12-18 DIAGNOSIS — G3189 Other specified degenerative diseases of nervous system: Secondary | ICD-10-CM | POA: Diagnosis not present

## 2021-12-18 DIAGNOSIS — F09 Unspecified mental disorder due to known physiological condition: Secondary | ICD-10-CM | POA: Diagnosis not present

## 2021-12-18 DIAGNOSIS — F209 Schizophrenia, unspecified: Secondary | ICD-10-CM | POA: Diagnosis not present

## 2021-12-18 MED ORDER — CLONAZEPAM 0.5 MG PO TABS
0.5000 mg | ORAL_TABLET | Freq: Every day | ORAL | Status: DC
  Administered 2021-12-19 – 2022-01-02 (×15): 0.5 mg via ORAL
  Filled 2021-12-18 (×15): qty 1
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
## 2021-12-18 NOTE — Progress Notes (Signed)
Patient has been sleeping since 2 am, continue to monitor. Adam [name]
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
## 2021-12-18 NOTE — Progress Notes (Signed)
Patient agitated, yelling in the hallways, trying to go in clean supply room, not easily redirected. Haldol IM given as ordered with no change in behaviors, now given ativan IM as ordered. Anselm Jungling

## 2021-12-19 DIAGNOSIS — I214 Non-ST elevation (NSTEMI) myocardial infarction: Secondary | ICD-10-CM | POA: Diagnosis not present

## 2021-12-19 DIAGNOSIS — G3189 Other specified degenerative diseases of nervous system: Secondary | ICD-10-CM | POA: Diagnosis not present

## 2021-12-19 DIAGNOSIS — F209 Schizophrenia, unspecified: Secondary | ICD-10-CM | POA: Diagnosis not present

## 2021-12-19 DIAGNOSIS — F09 Unspecified mental disorder due to known physiological condition: Secondary | ICD-10-CM | POA: Diagnosis not present

## 2021-12-19 DIAGNOSIS — S069XAS Unspecified intracranial injury with loss of consciousness status unknown, sequela: Secondary | ICD-10-CM | POA: Diagnosis not present

## 2021-12-19 NOTE — TOC Progression Note (Signed)
Transition of Care [facility]) - Progression Note    Patient Details  Name: Adam [name] MRN: [id number] Date of Birth: Sep 08, 1961  Transition of Care [facility]) CM/SW Contact  Eileen Stanford, LCSW Phone Number: 12/19/2021, 1:33 PM  Clinical Narrative:   Barbaraann Rondo with [facility] is to follow up with Group Home.    Expected Discharge Plan: Group Home Barriers to Discharge: Continued Medical Work up  Expected Discharge Plan and Services Expected Discharge Plan: Group Home In-house Referral: Clinical Social Work     Living arrangements for the past 2 months: Group Home                                       Social Determinants of Health (SDOH) Interventions    Readmission Risk Interventions No flowsheet data found.
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
## 2021-12-19 NOTE — Progress Notes (Signed)
Mobility Specialist - Progress Note   12/19/21 1500  Mobility  Activity Ambulated independently in hallway  Level of Assistance Standby assist, set-up cues, supervision of patient - no hands on  Assistive Device None  Distance Ambulated (ft) 500 ft  Activity Response Tolerated well  $Mobility charge 1 Mobility    Pt ambulated > 500' in hallway. Left in chair with sitter present.   Filiberto Pinks Mobility Specialist 12/19/21, 3:23 PM
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
## 2021-12-20 DIAGNOSIS — G3189 Other specified degenerative diseases of nervous system: Secondary | ICD-10-CM | POA: Diagnosis not present

## 2021-12-20 DIAGNOSIS — F209 Schizophrenia, unspecified: Secondary | ICD-10-CM | POA: Diagnosis not present

## 2021-12-20 DIAGNOSIS — S069X9S Unspecified intracranial injury with loss of consciousness of unspecified duration, sequela: Secondary | ICD-10-CM | POA: Diagnosis not present

## 2021-12-20 DIAGNOSIS — F09 Unspecified mental disorder due to known physiological condition: Secondary | ICD-10-CM | POA: Diagnosis not present

## 2021-12-20 LAB — CBC
HCT: 39.5 % (ref 39.0–52.0)
Hemoglobin: 13.2 g/dL (ref 13.0–17.0)
MCH: 31.7 pg (ref 26.0–34.0)
MCHC: 33.4 g/dL (ref 30.0–36.0)
MCV: 95 fL (ref 80.0–100.0)
Platelets: 154 10*3/uL (ref 150–400)
RBC: 4.16 MIL/uL — ABNORMAL LOW (ref 4.22–5.81)
RDW: 14.9 % (ref 11.5–15.5)
WBC: 5 10*3/uL (ref 4.0–10.5)
nRBC: 0 % (ref 0.0–0.2)

## 2021-12-20 LAB — BASIC METABOLIC PANEL
Anion gap: 3 — ABNORMAL LOW (ref 5–15)
BUN: 15 mg/dL (ref 8–23)
CO2: 30 mmol/L (ref 22–32)
Calcium: 8.8 mg/dL — ABNORMAL LOW (ref 8.9–10.3)
Chloride: 103 mmol/L (ref 98–111)
Creatinine, Ser: 0.9 mg/dL (ref 0.61–1.24)
GFR, Estimated: >60 mL/min (ref >60)
Glucose, Bld: 83 mg/dL (ref 70–99)
Potassium: 3.9 mmol/L (ref 3.5–5.1)
Sodium: 136 mmol/L (ref 135–145)

## 2021-12-20 LAB — IRON AND TIBC
Iron: 112 ug/dL (ref 45–182)
Saturation Ratios: 38 % (ref 17.9–39.5)
TIBC: 294 ug/dL (ref 250–450)
UIBC: 182 ug/dL

## 2021-12-20 LAB — FOLATE: Folate: 38.1 ng/mL (ref >5.9)

## 2021-12-20 LAB — VITAMIN B12: Vitamin B-12: 483 pg/mL (ref 180–914)

## 2021-12-20 LAB — VITAMIN D 25 HYDROXY (VIT D DEFICIENCY, FRACTURES): Vit D, 25-Hydroxy: 56.88 ng/mL (ref 30–100)

## 2021-12-20 NOTE — TOC Progression Note (Signed)
Transition of Care [facility]) - Progression Note    Patient Details  Name: Adam [name] MRN: [id number] Date of Birth: August 05, 1961  Transition of Care [facility]) CM/SW Hood, LCSW Phone Number: 12/20/2021, 1:33 PM  Clinical Narrative:   CSW spoke with Cristie Hem at [facility] and report was made. CSW also spoke with [facility] and she was going to give the group home a call and request a update following Dr Vito Berger conversation with the group home director last week.    Expected Discharge Plan: Group Home Barriers to Discharge: Continued Medical Work up  Expected Discharge Plan and Services Expected Discharge Plan: Group Home In-house Referral: Clinical Social Work     Living arrangements for the past 2 months: Group Home                                       Social Determinants of Health (SDOH) Interventions    Readmission Risk Interventions No flowsheet data found.
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
## 2021-12-20 NOTE — Progress Notes (Signed)
Mobility Specialist - Progress Note   12/20/21 1200  Mobility  Activity Ambulated independently in hallway  Range of Motion/Exercises Active  Level of Assistance Standby assist, set-up cues, supervision of patient - no hands on  Assistive Device None  Distance Ambulated (ft) 200 ft  Activity Response Tolerated fair  Transport method Ambulatory  $Mobility charge 1 Mobility     Pt ambulated in hallway with supervision. Upon return to room, pt selected one ADL tasks to complete (brush teeth). After setting up task, pt refused to complete. Pt agitated this date, unable to redirect. Pt left in room with sitter present.    Filiberto Pinks Mobility Specialist 12/20/21, 12:48 PM

## 2021-12-21 DIAGNOSIS — S069X9S Unspecified intracranial injury with loss of consciousness of unspecified duration, sequela: Secondary | ICD-10-CM | POA: Diagnosis not present

## 2021-12-21 DIAGNOSIS — F09 Unspecified mental disorder due to known physiological condition: Secondary | ICD-10-CM | POA: Diagnosis not present

## 2021-12-21 DIAGNOSIS — F209 Schizophrenia, unspecified: Secondary | ICD-10-CM | POA: Diagnosis not present

## 2021-12-21 DIAGNOSIS — G3189 Other specified degenerative diseases of nervous system: Secondary | ICD-10-CM | POA: Diagnosis not present
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
## 2021-12-21 NOTE — Progress Notes (Signed)
Mobility Specialist - Progress Note   12/21/21 1400  Mobility  Activity Ambulated with assistance in room  Range of Motion/Exercises Active  Level of Assistance Standby assist, set-up cues, supervision of patient - no hands on  Assistive Device None  Distance Ambulated (ft) 30 ft  Activity Response Tolerated well  Transport method Ambulatory  $Mobility charge 1 Mobility     Pt sitting EOB on arrival, moisturizing LE. Pt donned socks and shoes without assistance. Pt agreeable to making bed with set up + vc for sequencing tasks. Pt particular about tag placement on sheets and how they are aligned on the bed (prefers tags to be on L side at foot of bed), ensuring sheets are nicely tucked, and lengths are even on both sides. Pt practiced making welcome baskets with materials in room. Tolerated very well. Pt left in chair with sitter present.   Filiberto Pinks Mobility Specialist 12/21/21, 3:01 PM

## 2021-12-21 NOTE — TOC Progression Note (Signed)
Transition of Care [facility]) - Progression Note    Patient Details  Name: Vedant Mendias MRN: [id number] Date of Birth: 07-09-1961  Transition of Care [facility]) CM/SW Contact  Eileen Stanford, LCSW Phone Number: 12/21/2021, 1:30 PM  Clinical Narrative:   CSW received a call from [place] with [facility] and she is coming to see pt in about a hour.     Expected Discharge Plan: Group Home Barriers to Discharge: Continued Medical Work up  Expected Discharge Plan and Services Expected Discharge Plan: Group Home In-house Referral: Clinical Social Work     Living arrangements for the past 2 months: Group Home                                       Social Determinants of Health (SDOH) Interventions    Readmission Risk Interventions No flowsheet data found.

## 2021-12-22 DIAGNOSIS — G3189 Other specified degenerative diseases of nervous system: Secondary | ICD-10-CM | POA: Diagnosis not present

## 2021-12-22 DIAGNOSIS — S069X9S Unspecified intracranial injury with loss of consciousness of unspecified duration, sequela: Secondary | ICD-10-CM | POA: Diagnosis not present

## 2021-12-22 DIAGNOSIS — F209 Schizophrenia, unspecified: Secondary | ICD-10-CM | POA: Diagnosis not present

## 2021-12-22 DIAGNOSIS — F09 Unspecified mental disorder due to known physiological condition: Secondary | ICD-10-CM | POA: Diagnosis not present

## 2021-12-22 MED ORDER — ZIPRASIDONE MESYLATE 20 MG IM SOLR
20.0000 mg | Freq: Once | INTRAMUSCULAR | Status: AC
  Administered 2021-12-22: 20 mg via INTRAMUSCULAR
  Filled 2021-12-22 (×2): qty 20

## 2021-12-22 NOTE — Progress Notes (Signed)
Responded to rapid response page, but was told it was a security alert, the patient had thrown himself to floor. Patient in room with staff and security. Patient in room yelling and screaming "Mother fucker" repeatedly and "NURSE,NURSE!" 2 security guards present, staff present with patient.

## 2021-12-22 NOTE — Progress Notes (Signed)
Patient yelling out and banging on the wall in his room. Mother was called to talk patient down. Patient responded well to his mother on the phone. Mother is very concerned that patient's medications need to be reviewed. Mother encouraged to call and speak to MD tomorrow. Adam [name]
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
## 2021-12-22 NOTE — TOC Progression Note (Signed)
Transition of Care [facility]) - Progression Note    Patient Details  Name: Andren Bethea MRN: [id number] Date of Birth: [birth year]  [facility] Care [facility]) CM/SW Contact  Maree Krabbe, LCSW Phone Number: 12/22/2021, 2:32 PM  Clinical Narrative:  CSW called Alona Bene with [facility] (cell: [phone])-- left voicemail.     Expected Discharge Plan: Group Home Barriers to Discharge: Continued Medical Work up  Expected Discharge Plan and Services Expected Discharge Plan: Group Home In-house Referral: Clinical Social Work     Living arrangements for the past 2 months: Group Home                                       Social Determinants of Health (SDOH) Interventions    Readmission Risk Interventions No flowsheet data found.

## 2021-12-22 NOTE — Progress Notes (Signed)
Chaplain responded to rapid response page. Pt on floor restrained by security and nurses. Then escorted back to room. Pt was yelling curse words and yelling for NURSE. Chaplain spoke with several staff to provide support. Pt unavailable.
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
## 2021-12-23 DIAGNOSIS — F209 Schizophrenia, unspecified: Secondary | ICD-10-CM | POA: Diagnosis not present

## 2021-12-23 DIAGNOSIS — S069X9S Unspecified intracranial injury with loss of consciousness of unspecified duration, sequela: Secondary | ICD-10-CM | POA: Diagnosis not present

## 2021-12-23 DIAGNOSIS — F09 Unspecified mental disorder due to known physiological condition: Secondary | ICD-10-CM | POA: Diagnosis not present

## 2021-12-23 DIAGNOSIS — G3189 Other specified degenerative diseases of nervous system: Secondary | ICD-10-CM | POA: Diagnosis not present

## 2021-12-23 MED ORDER — ZIPRASIDONE MESYLATE 20 MG IM SOLR
10.0000 mg | Freq: Four times a day (QID) | INTRAMUSCULAR | Status: AC | PRN
  Filled 2021-12-23: qty 20

## 2021-12-23 NOTE — Progress Notes (Signed)
Mobility Specialist - Progress Note   12/23/21 1100  Mobility  Activity Ambulated with assistance in hallway  Range of Motion/Exercises Active  Level of Assistance Standby assist, set-up cues, supervision of patient - no hands on  Assistive Device None  Distance Ambulated (ft) 500 ft  Activity Response Tolerated well  Transport method Ambulatory  $Mobility charge 1 Mobility    Pt ambulated > 500' in hallway. Upon return to room, pt made up bed with vc for sequencing. Pt left with sitter.    Filiberto Pinks Mobility Specialist 12/23/21, 11:22 AM

## 2021-12-23 NOTE — Progress Notes (Signed)
Patient agitated towards staff. Attempting to go into clean ulitly room on unit. Patient unable to be redirected, screaming at staff and lunging to door. Security called to help escort patient back to room. Ims given per order with minimal effects noted.

## 2021-12-23 NOTE — Plan of Care (Signed)
  Problem: Health Behavior/Discharge Planning: Goal: Ability to manage health-related needs will improve Outcome: Progressing   

## 2021-12-23 NOTE — Progress Notes (Signed)
[facility] Progress Note  Patient: Adam [name]    [id number]  DOA: 07/20/2021     Date of Service: the patient was seen and examined on 12/23/2021  Chief Complaint  Patient presents with   Psychiatric Evaluation   Brief hospital course: Adam [name] is a 61 yo WM with hx of schizophrenia, CVA, significant brain injury, who was initially admitted to [facility] ER on July 20, 2021. He has spent more than 123 days in the [facility] ED awaiting psychiatric placement. He was initially brought to [facility] ER due to violent outbursts at his group home. He was IVC'd initially.  Due to difficulty in placing him into facility, pt has essentially been living in the [facility] ER.  He was noted to be lethargic 12/19, found to have NSTEMI.  Pt then admitted to hospitalist service.  Cardiology consulted who recommended no intervention.     After admission to the medical service patient became agitated on the floor.  Wandering behavior, going into the patient's rooms.  Security called number of times which seemed to exacerbate the situation.  Multiple reengagement of psychiatry with no further recommendation and refusal to accept pt to inpatient psych service.  Senior management involved.  Night covering provider transferred pt to ICU night of 12/24 for precedex gtt due to severe agitation and combative behavior.  PCCM was consulted.  Precedex felt to be ineffective and was turned off.  Pt was transferred back to floor on 1/4. Patient remains difficult to redirect.   Patient condition is more stable, currently pending for placement.   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 3 times a day.  Scheduled phenobarbital.  Cogentin 0.5 mg twice a day for EPS.  Patient still has significant agitation, requiring more as needed  Ativan and Haldol.  Followed by psychiatry. 1/20 patient was very agitated and combative, received 1 dose of Geodon IM Started Geodon as needed  Non-STEMI. No intervention for cardiology.  Continue beta-blocker, aspirin and the atorvastatin. Condition has resolved.  Acute urinary tension. Resolved  Acute pulmonary edema secondary to acute on chronic diastolic congestive heart failure. Ejection fraction 45 to 50% on most recent echocardiogram. Condition has resolved.     Body mass index is 26.82 kg/m.  Interventions:       Diet: Regular DVT Prophylaxis: Subcutaneous Lovenox   Advance goals of care discussion: DNR  Family Communication: family was not present at bedside, at the time of interview.  Patient has aphasia 1/21 discussed with patient's mother over the phone, she would like to go over his medications with psychiatrist because patient is having recurrent behavioral issues.  Disposition:  Pt is from Home, admitted with altered mental status, still has AMS, which precludes a safe discharge. Discharge to TBD pending placement, when bed available, medically stable.  Subjective: No significant overnight events, yesterday afternoon patient was very agitated and combative, security was called, patient was given Geodon and then he was under control. Today morning he was walking around in the hallway with RN, no any other active issues.    Physical Exam: General:  alert not oriented to time, place, and person.  Appear in no distress, affect anxious Eyes: PERRLA ENT: Oral Mucosa Clear, moist  Neck: no JVD,  Cardiovascular: S1 and S2 Present, no Murmur,  Respiratory: good respiratory effort, Bilateral Air entry equal and Decreased,  no Crackles, no wheezes Abdomen: Bowel Sound present, Soft and no tenderness,  Skin: no rashes Extremities: no Pedal edema, no calf tenderness Neurologic: without any new focal findings Gait not checked due to patient safety  concerns  Vitals:   12/21/21 0738 12/21/21 1626 12/22/21 1922 12/23/21 1045  BP: 108/73 102/62 127/87 115/89  Pulse: 85 87 83 89  Resp: 17 18 18 17   Temp: 98.2 F (36.8 C) 98.2 F (36.8 C) 98.2 F (36.8 C) 97.8 F (36.6 C)  TempSrc: Oral   Oral  SpO2: 99% 99% 100% 100%  Weight:      Height:       No intake or output data in the 24 hours ending 12/23/21 1529   Filed Weights   07/20/21 1635 12/07/21 2025  Weight: 79.4 kg 80 kg    Data Reviewed: I have personally reviewed and interpreted daily labs, tele strips, imagings as discussed above. I reviewed all nursing notes, pharmacy notes, vitals, pertinent old records I have discussed plan of care as described above with RN and patient/family.  CBC: Recent Labs  Lab 12/20/21 0910  WBC 5.0  HGB 13.2  HCT 39.5  MCV 95.0  PLT [id number]   Basic Metabolic Panel: Recent Labs  Lab 12/20/21 0910  NA 136  K 3.9  CL 103  CO2 30  GLUCOSE 83  BUN 15  CREATININE 0.90  CALCIUM 8.8*    Studies: No results found.  Scheduled Meds:  aspirin  81 mg Oral Daily   atorvastatin  40 mg Oral Daily   benztropine  0.5 mg Oral BID   clonazePAM  0.5 mg Oral QPC lunch   enoxaparin (LOVENOX) injection  40 mg Subcutaneous [id number]   folic acid  1 mg Oral Daily   gabapentin  300 mg Oral TID   haloperidol  5 mg Oral TID   LORazepam  1 mg Oral TID   mouth rinse  15 mL Mouth Rinse BID   metoprolol succinate  25 mg Oral Daily   multivitamin with minerals  1 tablet Oral Daily   phenobarbital  32.4 mg Oral TID   polyethylene glycol  17 g Oral BID   temazepam  15 mg Oral QHS   thiamine  100 mg Oral Daily   Continuous Infusions: PRN Meds: acetaminophen, bisacodyl, diphenhydrAMINE **OR** diphenhydrAMINE, famotidine, haloperidol **OR** haloperidol lactate, LORazepam, LORazepam, nitroGLYCERIN, ondansetron (ZOFRAN) IV, ziprasidone  Time spent: 35 minutes  Author: Val Riles. MD [facility] 12/23/2021 3:29 PM  To reach On-call, see care  teams to locate the attending and reach out to them via [email or web address]. If 7PM-7AM, please contact night-coverage If you still have difficulty reaching the attending provider, please page the [facility] (Director on Call) for [facility] on amion for assistance.

## 2021-12-24 DIAGNOSIS — G3189 Other specified degenerative diseases of nervous system: Secondary | ICD-10-CM | POA: Diagnosis not present

## 2021-12-24 DIAGNOSIS — F209 Schizophrenia, unspecified: Secondary | ICD-10-CM | POA: Diagnosis not present

## 2021-12-24 DIAGNOSIS — F09 Unspecified mental disorder due to known physiological condition: Secondary | ICD-10-CM | POA: Diagnosis not present

## 2021-12-24 DIAGNOSIS — S069X9S Unspecified intracranial injury with loss of consciousness of unspecified duration, sequela: Secondary | ICD-10-CM | POA: Diagnosis not present

## 2021-12-24 NOTE — Progress Notes (Signed)
[facility] Progress Note  Patient: Adam [name]    IFO:[id number]  DOA: 07/20/2021     Date of Service: the patient was seen and examined on 12/24/2021  Chief Complaint  Patient presents with   Psychiatric Evaluation   Brief hospital course: Finnlee Silvernail is a 61 yo WM with hx of schizophrenia, CVA, significant brain injury, who was initially admitted to [facility] ER on July 20, 2021. He has spent more than 123 days in the [facility] ED awaiting psychiatric placement. He was initially brought to [facility] ER due to violent outbursts at his group home. He was IVC'd initially.  Due to difficulty in placing him into facility, pt has essentially been living in the [facility] ER.  He was noted to be lethargic 12/19, found to have NSTEMI.  Pt then admitted to hospitalist service.  Cardiology consulted who recommended no intervention.     After admission to the medical service patient became agitated on the floor.  Wandering behavior, going into the patient's rooms.  Security called number of times which seemed to exacerbate the situation.  Multiple reengagement of psychiatry with no further recommendation and refusal to accept pt to inpatient psych service.  Senior management involved.  Night covering provider transferred pt to ICU night of 12/24 for precedex gtt due to severe agitation and combative behavior.  PCCM was consulted.  Precedex felt to be ineffective and was turned off.  Pt was transferred back to floor on 1/4. Patient remains difficult to redirect.   Patient condition is more stable, currently pending for placement.   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 3 times a day.  Scheduled phenobarbital.  Cogentin 0.5 mg twice a day for EPS.  Patient still has significant agitation, requiring more as needed  Ativan and Haldol.  Followed by psychiatry. 1/20 patient was very agitated and combative, received 1 dose of Geodon IM Started Geodon as needed  Non-STEMI. No intervention for cardiology.  Continue beta-blocker, aspirin and the atorvastatin. Condition has resolved.  Acute urinary tension. Resolved  Acute pulmonary edema secondary to acute on chronic diastolic congestive heart failure. Ejection fraction 45 to 50% on most recent echocardiogram. Condition has resolved.     Body mass index is 26.82 kg/m.  Interventions:       Diet: Regular DVT Prophylaxis: Subcutaneous Lovenox   Advance goals of care discussion: DNR  Family Communication: family was not present at bedside, at the time of interview.  Patient has aphasia 1/21 discussed with patient's mother over the phone, she would like to go over his medications with psychiatrist because patient is having recurrent behavioral issues.  Disposition:  Pt is from Home, admitted with altered mental status, still has AMS, which precludes a safe discharge. Discharge to TBD pending placement, when bed available, medically stable.  Subjective: No significant overnight events, patient was sleeping,  No significant issues so far   Physical Exam: General:  alert not oriented to time, place, and person.  Appear in no distress, affect anxious Eyes: PERRLA ENT: Oral Mucosa Clear, moist  Neck: no JVD,  Cardiovascular: S1 and S2 Present, no Murmur,  Respiratory: good respiratory effort, Bilateral Air entry equal and Decreased, no Crackles, no wheezes Abdomen: Bowel Sound present, Soft and no tenderness,  Skin: no rashes Extremities: no Pedal edema, no calf tenderness Neurologic: without any new focal findings  Gait not checked due to patient safety concerns  Vitals:   12/23/21 1045 12/23/21 2033 12/24/21 0631 12/24/21 0814  BP: 115/89 116/86 116/73 115/67  Pulse: 89 94 77 76  Resp: 17 20 18 17   Temp: 97.8 F (36.6 C) 98.7 F  (37.1 C) 98 F (36.7 C) 97.8 F (36.6 C)  TempSrc: Oral Oral Oral   SpO2: 100% 100% 100% 100%  Weight:      Height:       No intake or output data in the 24 hours ending 12/24/21 1431   Filed Weights   07/20/21 1635 12/07/21 2025  Weight: 79.4 kg 80 kg    Data Reviewed: I have personally reviewed and interpreted daily labs, tele strips, imagings as discussed above. I reviewed all nursing notes, pharmacy notes, vitals, pertinent old records I have discussed plan of care as described above with RN and patient/family.  CBC: Recent Labs  Lab 12/20/21 0910  WBC 5.0  HGB 13.2  HCT 39.5  MCV 95.0  PLT 154   Basic Metabolic Panel: Recent Labs  Lab 12/20/21 0910  NA 136  K 3.9  CL 103  CO2 30  GLUCOSE 83  BUN 15  CREATININE 0.90  CALCIUM 8.8*    Studies: No results found.  Scheduled Meds:  aspirin  81 mg Oral Daily   atorvastatin  40 mg Oral Daily   benztropine  0.5 mg Oral BID   clonazePAM  0.5 mg Oral QPC lunch   enoxaparin (LOVENOX) injection  40 mg Subcutaneous Q24H   folic acid  1 mg Oral Daily   gabapentin  300 mg Oral TID   haloperidol  5 mg Oral TID   LORazepam  1 mg Oral TID   mouth rinse  15 mL Mouth Rinse BID   metoprolol succinate  25 mg Oral Daily   multivitamin with minerals  1 tablet Oral Daily   phenobarbital  32.4 mg Oral TID   polyethylene glycol  17 g Oral BID   temazepam  15 mg Oral QHS   thiamine  100 mg Oral Daily   Continuous Infusions: PRN Meds: acetaminophen, bisacodyl, diphenhydrAMINE **OR** diphenhydrAMINE, famotidine, haloperidol **OR** haloperidol lactate, LORazepam, LORazepam, nitroGLYCERIN, ondansetron (ZOFRAN) IV, ziprasidone  Time spent: 35 minutes  Author: 12/22/21. MD [facility] 12/24/2021 2:31 PM  To reach On-call, see care teams to locate the attending and reach out to them via [email or web address]. If 7PM-7AM, please contact night-coverage If you still have difficulty reaching the attending provider, please  page the [facility] (Director on Call) for [facility] on amion for assistance.

## 2021-12-25 DIAGNOSIS — S069X9S Unspecified intracranial injury with loss of consciousness of unspecified duration, sequela: Secondary | ICD-10-CM | POA: Diagnosis not present

## 2021-12-25 DIAGNOSIS — G3189 Other specified degenerative diseases of nervous system: Secondary | ICD-10-CM | POA: Diagnosis not present

## 2021-12-25 DIAGNOSIS — F209 Schizophrenia, unspecified: Secondary | ICD-10-CM | POA: Diagnosis not present

## 2021-12-25 DIAGNOSIS — F09 Unspecified mental disorder due to known physiological condition: Secondary | ICD-10-CM | POA: Diagnosis not present

## 2021-12-25 NOTE — Progress Notes (Signed)
Patient continues to walk about room and unit with Recruitment consultant. Patient entered the clean utility room and was able to obtain two razors. Behavior deescalated and patient voluntarily handed razors to security.   Patient took some medications willingly, refused others. Patient refusing vitals at this time. Will attempt again around 1030 this morning. Safety sitter at bedside.

## 2021-12-25 NOTE — Plan of Care (Signed)

## 2021-12-25 NOTE — Progress Notes (Signed)
[facility] Progress Note  Patient: Adam [name]    [id number]  DOA: 07/20/2021     Date of Service: the patient was seen and examined on 12/25/2021  Chief Complaint  Patient presents with   Psychiatric Evaluation   Brief hospital course: Adam [name] is a 61 yo WM with hx of schizophrenia, CVA, significant brain injury, who was initially admitted to [facility] ER on July 20, 2021. He has spent more than 123 days in the [facility] ED awaiting psychiatric placement. He was initially brought to [facility] ER due to violent outbursts at his group home. He was IVC'd initially.  Due to difficulty in placing him into facility, pt has essentially been living in the [facility] ER.  He was noted to be lethargic 12/19, found to have NSTEMI.  Pt then admitted to hospitalist service.  Cardiology consulted who recommended no intervention.     After admission to the medical service patient became agitated on the floor.  Wandering behavior, going into the patient's rooms.  Security called number of times which seemed to exacerbate the situation.  Multiple reengagement of psychiatry with no further recommendation and refusal to accept pt to inpatient psych service.  Senior management involved.  Night covering provider transferred pt to ICU night of 12/24 for precedex gtt due to severe agitation and combative behavior.  PCCM was consulted.  Precedex felt to be ineffective and was turned off.  Pt was transferred back to floor on 1/4. Patient remains difficult to redirect.   Patient condition is more stable, currently pending for placement.   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 3 times a day.  Scheduled phenobarbital.  Cogentin 0.5 mg twice a day for EPS.  Patient still has significant agitation, requiring more as needed  Ativan and Haldol.  Followed by psychiatry. 1/20 patient was very agitated and combative, received 1 dose of Geodon IM Started Geodon as needed  Non-STEMI. No intervention for cardiology.  Continue beta-blocker, aspirin and the atorvastatin. Condition has resolved.  Acute urinary tension. Resolved  Acute pulmonary edema secondary to acute on chronic diastolic congestive heart failure. Ejection fraction 45 to 50% on most recent echocardiogram. Condition has resolved.     Body mass index is 26.82 kg/m.  Interventions:       Diet: Regular DVT Prophylaxis: Subcutaneous Lovenox   Advance goals of care discussion: DNR  Family Communication: family was not present at bedside, at the time of interview.  Patient has aphasia 1/21 discussed with patient's mother over the phone, she would like to go over his medications with psychiatrist because patient is having recurrent behavioral issues.  Disposition:  Pt is from Home, admitted with altered mental status, still has AMS, which precludes a safe discharge. Discharge to TBD pending placement, when bed available, medically stable.  Subjective: No significant overnight events, patient was eating breakfast, no any other active issues.  Physical Exam: General:  alert not oriented to time, place, and person.  Appear in no distress, affect anxious Eyes: PERRLA ENT: Oral Mucosa Clear, moist  Neck: no JVD,  Cardiovascular: S1 and S2 Present, no Murmur,  Respiratory: good respiratory effort, Bilateral Air entry equal and Decreased, no Crackles, no wheezes Abdomen: Bowel Sound present, Soft and no tenderness,  Skin: no rashes Extremities: no Pedal edema, no calf tenderness Neurologic: without any new focal findings Gait  not checked due to patient safety concerns  Vitals:   12/24/21 0814 12/24/21 1523 12/24/21 2320 12/25/21 1027  BP: 115/67 120/80 117/65 122/73  Pulse: 76 80 73 80  Resp: 17 17 18 20   Temp: 97.8 F (36.6 C) 98.5 F  (36.9 C) 98.1 F (36.7 C) (!) 97.4 F (36.3 C)  TempSrc:   Oral   SpO2: 100%  100% 100%  Weight:      Height:        Intake/Output Summary (Last 24 hours) at 12/25/2021 1511 Last data filed at 12/25/2021 1030 Gross per 24 hour  Intake 0 ml  Output --  Net 0 ml     Filed Weights   07/20/21 1635 12/07/21 2025  Weight: 79.4 kg 80 kg    Data Reviewed: I have personally reviewed and interpreted daily labs, tele strips, imagings as discussed above. I reviewed all nursing notes, pharmacy notes, vitals, pertinent old records I have discussed plan of care as described above with RN and patient/family.  CBC: Recent Labs  Lab 12/20/21 0910  WBC 5.0  HGB 13.2  HCT 39.5  MCV 95.0  PLT 123456   Basic Metabolic Panel: Recent Labs  Lab 12/20/21 0910  NA 136  K 3.9  CL 103  CO2 30  GLUCOSE 83  BUN 15  CREATININE 0.90  CALCIUM 8.8*    Studies: No results found.  Scheduled Meds:  aspirin  81 mg Oral Daily   atorvastatin  40 mg Oral Daily   benztropine  0.5 mg Oral BID   clonazePAM  0.5 mg Oral QPC lunch   enoxaparin (LOVENOX) injection  40 mg Subcutaneous [id number]   folic acid  1 mg Oral Daily   gabapentin  300 mg Oral TID   haloperidol  5 mg Oral TID   LORazepam  1 mg Oral TID   mouth rinse  15 mL Mouth Rinse BID   metoprolol succinate  25 mg Oral Daily   multivitamin with minerals  1 tablet Oral Daily   phenobarbital  32.4 mg Oral TID   polyethylene glycol  17 g Oral BID   temazepam  15 mg Oral QHS   thiamine  100 mg Oral Daily   Continuous Infusions: PRN Meds: acetaminophen, bisacodyl, diphenhydrAMINE **OR** diphenhydrAMINE, famotidine, haloperidol **OR** haloperidol lactate, LORazepam, LORazepam, nitroGLYCERIN, ondansetron (ZOFRAN) IV, ziprasidone  Time spent: 35 minutes  Author: Val Riles. MD [facility] 12/25/2021 3:11 PM  To reach On-call, see care teams to locate the attending and reach out to them via [email or web address]. If 7PM-7AM, please contact  night-coverage If you still have difficulty reaching the attending provider, please page the [facility] (Director on Call) for [facility] on amion for assistance.

## 2021-12-26 DIAGNOSIS — S069XAS Unspecified intracranial injury with loss of consciousness status unknown, sequela: Secondary | ICD-10-CM | POA: Diagnosis not present

## 2021-12-26 DIAGNOSIS — G3189 Other specified degenerative diseases of nervous system: Secondary | ICD-10-CM | POA: Diagnosis not present

## 2021-12-26 DIAGNOSIS — F209 Schizophrenia, unspecified: Secondary | ICD-10-CM | POA: Diagnosis not present

## 2021-12-26 DIAGNOSIS — F09 Unspecified mental disorder due to known physiological condition: Secondary | ICD-10-CM | POA: Diagnosis not present

## 2021-12-26 MED ORDER — PALIPERIDONE PALMITATE ER 156 MG/ML IM SUSY
156.0000 mg | PREFILLED_SYRINGE | INTRAMUSCULAR | Status: DC
  Administered 2021-12-26: 14:00:00 156 mg via INTRAMUSCULAR
  Filled 2021-12-26 (×2): qty 1

## 2021-12-26 NOTE — TOC Progression Note (Signed)
Transition of Care [facility]) - Progression Note    Patient Details  Name: Torris Matsuoka MRN: [id number] Date of Birth: [birth year]  Transition of Care [facility]) CM/SW [place], LCSW Phone Number: 12/26/2021, 8:50 AM  Clinical Narrative:   Pending APS report. CSW to follow up with APS.    Expected Discharge Plan: Group Home Barriers to Discharge: Continued Medical Work up  Expected Discharge Plan and Services Expected Discharge Plan: Group Home In-house Referral: Clinical Social Work     Living arrangements for the past 2 months: Group Home                                       Social Determinants of Health (SDOH) Interventions    Readmission Risk Interventions No flowsheet data found.

## 2021-12-26 NOTE — Progress Notes (Signed)
Pt yelling, very agitated. PRN Ativan IM given per orders.

## 2021-12-26 NOTE — Progress Notes (Signed)
Mobility Specialist - Progress Note   12/26/21 1000  Mobility  Activity Ambulated with assistance in hallway  Level of Assistance Standby assist, set-up cues, supervision of patient - no hands on  Assistive Device None  Distance Ambulated (ft) 300 ft  Activity Response Tolerated well  $Mobility charge 1 Mobility     Pt seen ambulating without shoes or grip socks. Encouraged patient to don shoes prior to continuation of activity for fall prevention. No physical assist, but some redirection needed. Pt ambulated hallway with supervision.    Filiberto Pinks Mobility Specialist 12/26/21, 10:32 AM

## 2021-12-26 NOTE — [place] FL2 (Signed)
[place] MEDICAID FL2 LEVEL OF CARE SCREENING TOOL     IDENTIFICATION  Patient Name: Adam [name] Birthdate: 16-Dec-1960 Sex: male Admission Date (Current Location): 07/20/2021  [place] and [place] Number:  Chiropodist and Address:  [facility], [address] [place], [place] [address]      Provider Number: [id number]  Attending Physician Name and Address:  Briant Cedar, MD  Relative Name and Phone Number:  Teoman, Giraud (Mother)   [phone] [facility])    Current Level of Care: Hospital Recommended Level of Care: Other (Comment) (Group HOme) Prior Approval Number:    Date Approved/Denied:   PASRR Number:    Discharge Plan: Other (Comment) (Group HOme)    Current Diagnoses: Patient Active Problem List   Diagnosis Date Noted   TBI (traumatic brain injury) 11/27/2021   Stroke (HCC) 11/27/2021   NSTEMI (non-ST elevated myocardial infarction) (HCC) 11/21/2021   Acute metabolic encephalopathy 11/21/2021   Myocardial infarction (HCC) 11/20/2021   Acute pulmonary edema (HCC) 11/20/2021   DNR (do not resuscitate)/DNI(Do Not Intubate) 11/20/2021   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) 07/25/2021   Schizophrenia, chronic condition with acute exacerbation (HCC) 07/20/2021    Orientation RESPIRATION BLADDER Height & Weight     Self  Normal Continent Weight: 176 lb 5.9 oz (80 kg) Height:  5\' 8"  (172.7 cm)  BEHAVIORAL SYMPTOMS/MOOD NEUROLOGICAL BOWEL NUTRITION STATUS  Wanderer, Dangerous to self, others or property   Continent Diet (regular diet)  AMBULATORY STATUS COMMUNICATION OF NEEDS Skin   Independent Verbally Normal                       Personal Care Assistance Level of Assistance  Bathing, Feeding, Dressing Bathing Assistance: Independent Feeding assistance: Independent Dressing Assistance: Independent Total Care Assistance: Independent   Functional Limitations Info  Sight,  Hearing, Speech Sight Info: Adequate Hearing Info: Adequate Speech Info: Adequate    SPECIAL CARE FACTORS FREQUENCY                       Contractures Contractures Info: Not present    Additional Factors Info  Code Status, Psychotropic, Allergies Code Status Info: DNR Allergies Info: Morphine And Related, Nsaids Psychotropic Info: LORazepam (ATIVAN) injection 2 mg  2 mg Intramuscular Q6H PRN,  haloperidol lactate (HALDOL) injection 5 mg  Intramuscular         Current Medications (12/26/2021):  This is the current hospital active medication list Current Facility-Administered Medications  Medication Dose Route Frequency Provider Last Rate Last Admin   acetaminophen (TYLENOL) tablet 650 mg  650 mg Oral Q4H PRN 12/28/2021, DO   650 mg at 12/26/21 1019   aspirin chewable tablet 81 mg  81 mg Oral Daily 12/28/21 B, MD   81 mg at 12/26/21 0909   atorvastatin (LIPITOR) tablet 40 mg  40 mg Oral Daily 12/28/21 A, MD   40 mg at 12/25/21 2127   benztropine (COGENTIN) tablet 0.5 mg  0.5 mg Oral BID 2128, DO   0.5 mg at 12/26/21 12/28/21   bisacodyl (DULCOLAX) suppository 10 mg  10 mg Rectal Daily PRN 6269 B, MD       clonazePAM Lolita Patella) tablet 0.5 mg  0.5 mg Oral QPC lunch [name], [name], MD   0.5 mg at 12/26/21 1329   diphenhydrAMINE (BENADRYL) capsule 50 mg  50 mg Oral Q6H PRN 12/28/21, NP   50 mg at  12/26/21 1016   Or   diphenhydrAMINE (BENADRYL) injection 50 mg  50 mg Intramuscular Q6H PRN Gabriel Cirri F, NP   50 mg at 12/25/21 2128   enoxaparin (LOVENOX) injection 40 mg  40 mg Subcutaneous Q24H Ronnald Ramp, RPH   40 mg at 12/25/21 2129   famotidine (PEPCID) tablet 20 mg  20 mg Oral BID PRN Carollee Herter, DO   20 mg at 11/15/21 7408   folic acid (FOLVITE) tablet 1 mg  1 mg Oral Daily Lolita Patella B, MD   1 mg at 12/26/21 1448   gabapentin (NEURONTIN) capsule 300 mg  300 mg Oral TID Carollee Herter, DO   300 mg at 12/26/21 1856    haloperidol (HALDOL) tablet 5 mg  5 mg Oral TID [place], Eric J, DO   5 mg at 12/26/21 1009   haloperidol (HALDOL) tablet 5 mg  5 mg Oral Q6H PRN Vanetta Mulders, NP   5 mg at 12/21/21 1207   Or   haloperidol lactate (HALDOL) injection 5 mg  5 mg Intramuscular Q6H PRN Gabriel Cirri F, NP   5 mg at 12/26/21 0906   LORazepam (ATIVAN) injection 1 mg  1 mg Intravenous Q4H PRN Carollee Herter, DO   1 mg at 12/04/21 1916   LORazepam (ATIVAN) injection 2 mg  2 mg Intramuscular Q6H PRN Darlin Priestly, MD   2 mg at 12/25/21 2335   LORazepam (ATIVAN) tablet 1 mg  1 mg Oral TID Lolita Patella B, MD   1 mg at 12/26/21 3149   MEDLINE mouth rinse  15 mL Mouth Rinse BID Darlin Priestly, MD   15 mL at 12/24/21 2324   metoprolol succinate (TOPROL-XL) 24 hr tablet 25 mg  25 mg Oral Daily Darlin Priestly, MD   25 mg at 12/26/21 0920   multivitamin with minerals tablet 1 tablet  1 tablet Oral Daily Carollee Herter, DO   1 tablet at 12/26/21 0920   nitroGLYCERIN (NITROSTAT) SL tablet 0.4 mg  0.4 mg Sublingual Q5 Min x 3 PRN Carollee Herter, DO       ondansetron [facility]) injection 4 mg  4 mg Intravenous Q6H PRN Carollee Herter, DO       paliperidone (INVEGA SUSTENNA) injection 156 mg  156 mg Intramuscular Q28 days [name], [name], MD   156 mg at 12/26/21 1330   PHENobarbital (LUMINAL) tablet 32.4 mg  32.4 mg Oral TID Darlin Priestly, MD   32.4 mg at 12/26/21 0907   polyethylene glycol (MIRALAX / GLYCOLAX) packet 17 g  17 g Oral BID Lolita Patella B, MD   17 g at 12/26/21 0909   temazepam (RESTORIL) capsule 15 mg  15 mg Oral QHS Carollee Herter, DO   15 mg at 12/25/21 2127   thiamine tablet 100 mg  100 mg Oral Daily Lolita Patella B, MD   100 mg at 12/26/21 7026     Discharge Medications: Please see discharge summary for a list of discharge medications.  Relevant Imaging Results:  Relevant Lab Results:   Additional Information SSN:[id number]  Reuel Boom Eleanna Theilen, LCSW

## 2021-12-26 NOTE — TOC Progression Note (Signed)
Transition of Care [facility]) - Progression Note    Patient Details  Name: Adam [name] MRN: [id number] Date of Birth: [birth year]  Transition [facility]) CM/SW Contact  Maree Krabbe, LCSW Phone Number: 12/26/2021, 2:12 PM  Clinical Narrative:   CSW spoke with pt's mother per a return call request. Pt's mother states she is concerned regarding medications. Pt's mother states she thinks its time for a "review and change in Adam [name] medicines" . Pt's mother then states " he will be 65 next month and should see a gerontologist if you have one on staff. [name]'s behavior has been uncontrolled in a manner that has not occurred previously." Pt's mother states she also told the MD this. Pt's mother also states APS told her that we didn't have a Legal Guardian document on file--CSW confirmed this is correct. Pt's mother states she can fax it to CSW--CSW provided fax number. Awaiting fax.  CSW spoke with Alona Bene with APS. Alona Bene states they are working with pt's mother to determine where pt's check are going. IN the meantime APS is requesting a capacity note via MD and a FL2 completed via CSW. CSW has notified MD and [facility]. Will send the appropriate documents once available.   CSW has also emailed [facility] to check for updates on the Group Home decision to take or not take pt.   Expected Discharge Plan: Group Home Barriers to Discharge: Continued Medical Work up  Expected Discharge Plan and Services Expected Discharge Plan: Group Home In-house Referral: Clinical Social Work     Living arrangements for the past 2 months: Group Home                                       Social Determinants of Health (SDOH) Interventions    Readmission Risk Interventions No flowsheet data found.

## 2021-12-26 NOTE — Progress Notes (Signed)
[facility] Progress Note  Patient: Adam [name]    TFT:[id number]  DOA: 07/20/2021     Date of Service: the patient was seen and examined on 12/26/2021  Chief Complaint  Patient presents with   Psychiatric Evaluation   Brief hospital course: Adam [name] is a 61 yo WM with hx of schizophrenia, CVA, significant brain injury, who was initially admitted to [facility] ER on July 20, 2021. He has spent more than 123 days in the [facility] ED awaiting psychiatric placement. He was initially brought to [facility] ER due to violent outbursts at his group home. He was IVC'd initially.  Due to difficulty in placing him into facility, pt has essentially been living in the [facility] ER.  He was noted to be lethargic 12/19, found to have NSTEMI.  Pt then admitted to hospitalist service.  Cardiology consulted who recommended no intervention.     After admission to the medical service patient became agitated on the floor.  Wandering behavior, going into the patient's rooms.  Security called number of times which seemed to exacerbate the situation.  Multiple reengagement of psychiatry with no further recommendation and refusal to accept pt to inpatient psych service.  Senior management involved.  Night covering provider transferred pt to ICU night of 12/24 for precedex gtt due to severe agitation and combative behavior.  PCCM was consulted.  Precedex felt to be ineffective and was turned off.  Pt was transferred back to floor on 1/4. Patient remains difficult to redirect, increasingly agitated.     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  Continue beta-blocker, aspirin and the atorvastatin Condition has resolved.  Acute urinary retention   Resolved  Acute pulmonary edema secondary to acute on chronic diastolic congestive heart failure. Ejection fraction 45 to 50% on most recent echocardiogram. Condition has resolved.     Body mass index is 26.82 kg/m.  Interventions:       Diet: Regular DVT Prophylaxis: Subcutaneous Lovenox   Advance goals of care discussion: DNR  Family Communication:  None at bedside  Disposition:  Pt is from Home, admitted with altered mental status, found to be psychotic with a hx of schizophrenia.  Does not meet criteria for psychiatric inpatient admission.  Patient is medically stable.  Pending placement     Subjective: Pt with pressured speech, was inappropriate, pulled down his pants and exposed his privates    Physical Exam: General: NAD  Cardiovascular: S1, S2 present Respiratory: CTAB Abdomen: Soft, nontender, nondistended, bowel sounds present Musculoskeletal: No bilateral pedal edema noted Skin: Normal Psychiatry:  Restless, agitated    Vitals:   12/25/21 1027 12/25/21 1512 12/25/21 1934 12/26/21 0900  BP: 122/73 124/90 128/89 118/83  Pulse: 80 76 96 90  Resp: 20 18 17 20   Temp: (!) 97.4 F (36.3 C) (!) 97.4 F (36.3 C) 98.5 F (36.9 C) 97.8 F (36.6 C)  TempSrc:  Oral  Oral  SpO2: 100% 98% 99% 100%  Weight:      Height:       No intake or output data in the 24 hours ending 12/26/21 1059    Filed Weights   07/20/21 1635 12/07/21 2025  Weight: 79.4 kg 80 kg    CBC: Recent Labs  Lab 12/20/21 0910  WBC 5.0  HGB 13.2  HCT  39.5  MCV 95.0  PLT 154   Basic Metabolic Panel: Recent Labs  Lab 12/20/21 0910  NA 136  K 3.9  CL 103  CO2 30  GLUCOSE 83  BUN 15  CREATININE 0.90  CALCIUM 8.8*    Studies: No results found.  Scheduled Meds:  aspirin  81 mg Oral Daily   atorvastatin  40 mg Oral Daily   benztropine  0.5 mg Oral BID   clonazePAM  0.5 mg Oral QPC lunch   enoxaparin (LOVENOX) injection  40 mg Subcutaneous Q24H   folic acid  1 mg  Oral Daily   gabapentin  300 mg Oral TID   haloperidol  5 mg Oral TID   LORazepam  1 mg Oral TID   mouth rinse  15 mL Mouth Rinse BID   metoprolol succinate  25 mg Oral Daily   multivitamin with minerals  1 tablet Oral Daily   phenobarbital  32.4 mg Oral TID   polyethylene glycol  17 g Oral BID   temazepam  15 mg Oral QHS   thiamine  100 mg Oral Daily   Continuous Infusions: PRN Meds: acetaminophen, bisacodyl, diphenhydrAMINE **OR** diphenhydrAMINE, famotidine, haloperidol **OR** haloperidol lactate, LORazepam, LORazepam, nitroGLYCERIN, ondansetron (ZOFRAN) IV    Author: Quita Skye, MD [facility] 12/26/2021 10:59 AM  To reach On-call, see care teams to locate the attending and reach out to them via [email or web address]. If 7PM-7AM, please contact night-coverage

## 2021-12-26 NOTE — Progress Notes (Signed)
Pt refused EKG due to agitation. Several attempts, unsuccessful

## 2021-12-27 DIAGNOSIS — S069X9S Unspecified intracranial injury with loss of consciousness of unspecified duration, sequela: Secondary | ICD-10-CM

## 2021-12-27 DIAGNOSIS — F209 Schizophrenia, unspecified: Secondary | ICD-10-CM | POA: Diagnosis not present

## 2021-12-27 DIAGNOSIS — G3189 Other specified degenerative diseases of nervous system: Secondary | ICD-10-CM | POA: Diagnosis not present

## 2021-12-27 DIAGNOSIS — F09 Unspecified mental disorder due to known physiological condition: Secondary | ICD-10-CM | POA: Diagnosis not present

## 2021-12-27 NOTE — Progress Notes (Signed)
Mobility Specialist - Progress Note   12/27/21 1000  Mobility  Activity Ambulated independently in hallway  Range of Motion/Exercises Active  Level of Assistance Standby assist, set-up cues, supervision of patient - no hands on  Assistive Device None  Distance Ambulated (ft) 500 ft  Activity Response Tolerated well  Transport method Ambulatory  $Mobility charge 1 Mobility    Pt made his bed and brushed teeth with min cueing. Ambulated > 500' in hallway with supervision.    Adam [name] Mobility Specialist 12/27/21, 10:40 AM

## 2021-12-27 NOTE — Progress Notes (Signed)
Statement of mental capacity for decision-making:  [name] lacks capacity for decision-making due to significant cognitive impairment as a result of traumatic brain injury, in addition to chronic schizophrenia.    The patient is not able to communicate decisions related to treatment choices, lacks understanding to process information related to his condition and treatment options, is unable to appreciate his cognitive deficits and due to chronic schizophrenia, has frequent delusional thinking.  Adam [name] is not capable of weighing risks vs benefits of treatment options needed in order to reach conclusion related to his care and treatment.

## 2021-12-27 NOTE — Consult Note (Signed)
[facility] Face-to-Face Psychiatry Consult   Reason for Consult: Follow-up consult for 61 year old man currently on the medical service seeking placement.  Patient known from previous encounters.  Chart reviewed.  Patient seen. 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 male patient admitted with patient not able to give coherent information.  HPI: Patient seen as noted and chart reviewed.  61 year old man who has a history of a severe brain injury with general cortical dysfunction causing expressive aphasia confused thinking disorganized behavior all on top of a presumed previously existing chronic mental illness.  Patient is unable to participate meaningfully in an interview.  At times seems to understand things being said to him but not consistently.  Cannot give consistent answers 1 way or another to questions.  His verbalizations are restricted to often shouting single words over and over.  Patient has shown multiple episodes of aggression usually when some kind of desire is thwarted.  He has been unable to participate in any discussion about effective treatment or treatment planning  Past Psychiatric History: Little information available except that he has had this chronic brain injury with general cortical dysfunction as well as chronic mental illness.  Has been in  living facilities in the past where they could not manage him.  Risk to Self:   Risk to Others:   Prior Inpatient Therapy:   Prior Outpatient Therapy:    Past Medical History:  Past Medical History:  Diagnosis Date   Myocardial infarction (HCC)    Schizophrenia (HCC)    Stroke (HCC)    TBI (traumatic brain injury)    History reviewed. No pertinent surgical history. Family History: History reviewed. No pertinent family history. Family Psychiatric  History: None reported Social History:  Social History   Substance and Sexual Activity  Alcohol Use Not Currently     Social History   Substance and Sexual Activity  Drug Use Not Currently    Social History   Socioeconomic History   Marital status: Single    Spouse name: Not on file   Number of children: Not on file   Years of education: Not on file   Highest education level: Not on file  Occupational History   Not on file  Tobacco Use   Smoking status: Never    Passive exposure: Never   Smokeless tobacco: Never  Vaping Use   Vaping Use: Unknown  Substance and Sexual Activity   Alcohol use: Not Currently   Drug use: Not Currently   Sexual activity: Not Currently  Other Topics Concern   Not on file  Social History Narrative   Not on file   Social Determinants of Health   Financial Resource Strain: Not on file  Food Insecurity: Not on file  Transportation Needs: Not on file  Physical Activity: Not on file  Stress: Not on file  Social Connections: Not on file   Additional Social History:    Allergies:  Allergies  Allergen Reactions   Morphine And Related    Nsaids     Labs: No results found for this or any previous visit (from the past 48 hour(s)).  Current Facility-Administered Medications  Medication Dose Route Frequency Provider Last Rate Last Admin   acetaminophen (TYLENOL) tablet 650 mg  650 mg Oral Q4H PRN Carollee Herter, DO   650 mg at 12/27/21 6606   aspirin chewable tablet 81 mg  81 mg Oral  Daily Lolita Patella B, MD   81 mg at 12/27/21 0825   atorvastatin (LIPITOR) tablet 40 mg  40 mg Oral Daily Lorine Bears A, MD   40 mg at 12/26/21 2020   benztropine (COGENTIN) tablet 0.5 mg  0.5 mg Oral BID Carollee Herter, DO   0.5 mg at 12/27/21 0825   bisacodyl (DULCOLAX) suppository 10 mg  10 mg Rectal Daily PRN Tresa Moore, MD       clonazePAM Scarlette Calico) tablet 0.5 mg  0.5 mg Oral QPC lunch Ranveer Wahlstrom T, MD   0.5 mg at 12/27/21 1213   diphenhydrAMINE (BENADRYL) capsule 50 mg  50 mg Oral Q6H PRN Vanetta Mulders, NP   50 mg at 12/26/21 1016   Or   diphenhydrAMINE (BENADRYL) injection 50 mg  50 mg Intramuscular Q6H PRN Gabriel Cirri F, NP   50 mg at 12/25/21 2128   enoxaparin (LOVENOX) injection 40 mg  40 mg Subcutaneous Q24H Ronnald Ramp, RPH   40 mg at 12/26/21 2021   famotidine (PEPCID) tablet 20 mg  20 mg Oral BID PRN Carollee Herter, DO   20 mg at 11/15/21 0045   folic acid (FOLVITE) tablet 1 mg  1 mg Oral Daily Lolita Patella B, MD   1 mg at 12/27/21 0827   gabapentin (NEURONTIN) capsule 300 mg  300 mg Oral TID Carollee Herter, DO   300 mg at 12/27/21 1520   haloperidol (HALDOL) tablet 5 mg  5 mg Oral TID [place], Eric J, DO   5 mg at 12/27/21 1520   haloperidol (HALDOL) tablet 5 mg  5 mg Oral Q6H PRN Vanetta Mulders, NP   5 mg at 12/21/21 1207   Or   haloperidol lactate (HALDOL) injection 5 mg  5 mg Intramuscular Q6H PRN Gabriel Cirri F, NP   5 mg at 12/26/21 0906   LORazepam (ATIVAN) injection 1 mg  1 mg Intravenous Q4H PRN Carollee Herter, DO   1 mg at 12/04/21 1916   LORazepam (ATIVAN) injection 2 mg  2 mg Intramuscular Q6H PRN Darlin Priestly, MD   2 mg at 12/26/21 1914   LORazepam (ATIVAN) tablet 1 mg  1 mg Oral TID Lolita Patella B, MD   1 mg at 12/27/21 1521   MEDLINE mouth rinse  15 mL Mouth Rinse BID Darlin Priestly, MD   15 mL at 12/24/21 2324   metoprolol succinate (TOPROL-XL) 24 hr tablet 25 mg  25 mg Oral Daily Darlin Priestly, MD   25 mg at 12/27/21 0825   multivitamin  with minerals tablet 1 tablet  1 tablet Oral Daily Carollee Herter, DO   1 tablet at 12/27/21 0827   nitroGLYCERIN (NITROSTAT) SL tablet 0.4 mg  0.4 mg Sublingual Q5 Min x 3 PRN Carollee Herter, DO       ondansetron [facility]) injection 4 mg  4 mg Intravenous Q6H PRN Carollee Herter, DO       paliperidone (INVEGA SUSTENNA) injection 156 mg  156 mg Intramuscular Q28 days Emylie Amster  T, MD   156 mg at 12/26/21 1330   PHENobarbital (LUMINAL) tablet 32.4 mg  32.4 mg Oral TID Darlin Priestly, MD   32.4 mg at 12/27/21 1521   polyethylene glycol (MIRALAX / GLYCOLAX) packet 17 g  17 g Oral BID Lolita Patella B, MD   17 g at 12/26/21 0909   temazepam (RESTORIL) capsule 15 mg  15 mg Oral QHS Carollee Herter, DO   15 mg at 12/26/21 2020   thiamine tablet 100 mg  100 mg Oral Daily Lolita Patella B, MD   100 mg at 12/27/21 4268    Musculoskeletal: Strength & Muscle Tone: within normal limits Gait & Station: normal Patient leans: N/A            Psychiatric Specialty Exam:  Presentation  General Appearance: Disheveled  Eye Contact:None  Speech:Clear and Coherent  Speech Volume:Increased  Handedness:Right   Mood and Affect  Mood:Anxious; Irritable  Affect:Congruent   Thought Process  Thought Processes:Disorganized  Descriptions of Associations:Loose  Orientation:Partial  Thought Content:Scattered; Illogical  History of Schizophrenia/Schizoaffective disorder:Yes  Duration of Psychotic Symptoms:Greater than six months  Hallucinations:No data recorded Ideas of Reference:-- (UTA)  Suicidal Thoughts:No data recorded Homicidal Thoughts:No data recorded  Sensorium  Memory:Remote Poor; Recent Poor; Immediate Poor; Other (comment) (impaired memory and cognition)  Judgment:Impaired  Insight:Lacking   Executive Functions  Concentration:Poor  Attention Span:Poor  Recall:Poor  Fund of Knowledge:Poor  Language:Poor   Psychomotor Activity  Psychomotor Activity:No data  recorded  Assets  Assets:Housing; Resilience; Social Support; Talents/Skills   Sleep  Sleep:No data recorded  Physical Exam: Physical Exam Constitutional:      Appearance: Normal appearance.  HENT:     Head: Normocephalic and atraumatic.     Mouth/Throat:     Pharynx: Oropharynx is clear.  Eyes:     Pupils: Pupils are equal, round, and reactive to light.  Cardiovascular:     Rate and Rhythm: Normal rate and regular rhythm.  Pulmonary:     Effort: Pulmonary effort is normal.     Breath sounds: Normal breath sounds.  Abdominal:     General: Abdomen is flat.     Palpations: Abdomen is soft.  Musculoskeletal:        General: Normal range of motion.  Skin:    General: Skin is warm and dry.  Neurological:     General: No focal deficit present.     Mental Status: He is alert. Mental status is at baseline.  Psychiatric:        Attention and Perception: He is inattentive.        Mood and Affect: Mood normal. Affect is labile and inappropriate.        Speech: He is noncommunicative.        Behavior: Behavior is agitated.        Thought Content: Thought content is delusional.        Cognition and Memory: Cognition is impaired. Memory is impaired.        Judgment: Judgment is impulsive and inappropriate.   Review of Systems  Unable to perform ROS: Dementia  Blood pressure 132/86, pulse 76, temperature 98 F (36.7 C), resp. rate 17, height 5\' 8"  (1.727 m), weight 80 kg, SpO2 97 %. Body mass index is 26.82 kg/m.  Treatment Plan Summary: Medication management and Plan patient has been medicated aggressively with only partial improvement.  Underlying need is for safe disposition.  Treatment team has been working on that without finding a plan yet.  Specific  question this time was about "capacity".  Patient is unable to communicate or discuss in any reasonable degree anything about his condition or about plans for the future.  He lacks the capacity to make any decisions or even  participate in any decisions about his treatment and is not at all likely to ever regain that capacity.  Disposition:  Patient lacks all capacity to make reasonable decisions.  Mordecai Rasmussen, MD 12/27/2021 4:18 PM
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
## 2021-12-27 NOTE — TOC Progression Note (Signed)
Transition of Care [facility]) - Progression Note    Patient Details  Name: Jacquese Hackman MRN: [id number] Date of Birth: 1961/04/25  Transition of Care [facility]) CM/SW Contact  Maree Krabbe, LCSW Phone Number: 12/27/2021, 1:21 PM  Clinical Narrative:   CSW continues to work with APS as well as [facility] to get the pt placed. No new news at thit time.  APS states they need a note stating pt's capacity. MD notified.     Expected Discharge Plan: Group Home Barriers to Discharge: Continued Medical Work up  Expected Discharge Plan and Services Expected Discharge Plan: Group Home In-house Referral: Clinical Social Work     Living arrangements for the past 2 months: Group Home                                       Social Determinants of Health (SDOH) Interventions    Readmission Risk Interventions No flowsheet data found.

## 2021-12-27 NOTE — TOC Progression Note (Signed)
Transition of Care [facility]) - Progression Note    Patient Details  Name: Adam [name] MRN: [id number] Date of Birth: January 05, 1961  Transition of Care [facility]) CM/SW Contact  Eileen Stanford, LCSW Phone Number: 12/27/2021, 2:01 PM  Clinical Narrative:   FL2 and capacity note emailed to Elonda Husky with APS.  CSW received a emial from Segundo with [facility] and this time she has not heard back from [facility] (group home). Keely spoke with Mr Wynona Neat at [facility] and they states pt is on the wait list for a bed.    Expected Discharge Plan: Group Home Barriers to Discharge: Continued Medical Work up  Expected Discharge Plan and Services Expected Discharge Plan: Group Home In-house Referral: Clinical Social Work     Living arrangements for the past 2 months: Group Home                                       Social Determinants of Health (SDOH) Interventions    Readmission Risk Interventions No flowsheet data found.

## 2021-12-28 DIAGNOSIS — F09 Unspecified mental disorder due to known physiological condition: Secondary | ICD-10-CM | POA: Diagnosis not present

## 2021-12-28 DIAGNOSIS — S069X9S Unspecified intracranial injury with loss of consciousness of unspecified duration, sequela: Secondary | ICD-10-CM | POA: Diagnosis not present

## 2021-12-28 DIAGNOSIS — G3189 Other specified degenerative diseases of nervous system: Secondary | ICD-10-CM | POA: Diagnosis not present

## 2021-12-28 DIAGNOSIS — F209 Schizophrenia, unspecified: Secondary | ICD-10-CM | POA: Diagnosis not present

## 2021-12-28 MED ORDER — HALOPERIDOL LACTATE 5 MG/ML IJ SOLN
5.0000 mg | Freq: Once | INTRAMUSCULAR | Status: AC | PRN
  Administered 2021-12-28: 5 mg via INTRAMUSCULAR
  Filled 2021-12-28: qty 1

## 2021-12-28 MED ORDER — ZIPRASIDONE MESYLATE 20 MG IM SOLR
20.0000 mg | INTRAMUSCULAR | Status: AC
Start: 2021-12-28 — End: 2021-12-28
  Administered 2021-12-28: 20 mg via INTRAMUSCULAR
  Filled 2021-12-28: qty 20

## 2021-12-28 NOTE — Progress Notes (Signed)
Mobility Specialist - Progress Note   12/28/21 1100  Mobility  Activity Ambulated independently in room;Stood at bedside;Dangled on edge of bed  Range of Motion/Exercises Active  Level of Assistance Standby assist, set-up cues, supervision of patient - no hands on  Assistive Device None  Distance Ambulated (ft) 30 ft  Activity Response Tolerated well  Transport method Ambulatory  $Mobility charge 1 Mobility     Pt participated in UB dressing and donning shoes without physical assist. Ambulated in room with supervision. Pt left EOB with sitter present.    Filiberto Pinks Mobility Specialist 12/28/21, 11:45 AM
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
## 2021-12-28 NOTE — TOC Progression Note (Signed)
Transition of Care [facility]) - Progression Note    Patient Details  Name: Adam [name] MRN: [id number] Date of Birth: 12/09/1960  Transition of Care [facility]) CM/SW Contact  Maree Krabbe, LCSW Phone Number: 12/28/2021, 9:27 AM  Clinical Narrative:   CSW spoke with Nancy Fetter at APS and she states she did not receive the fl2 or note yesterday - CSW resent and confirmed email. Joy states she is going to speak with her leader today and explore getting guardianship of pt.    Expected Discharge Plan: Group Home Barriers to Discharge: Continued Medical Work up  Expected Discharge Plan and Services Expected Discharge Plan: Group Home In-house Referral: Clinical Social Work     Living arrangements for the past 2 months: Group Home                                       Social Determinants of Health (SDOH) Interventions    Readmission Risk Interventions No flowsheet data found.

## 2021-12-29 DIAGNOSIS — G3189 Other specified degenerative diseases of nervous system: Secondary | ICD-10-CM | POA: Diagnosis not present

## 2021-12-29 DIAGNOSIS — F209 Schizophrenia, unspecified: Secondary | ICD-10-CM | POA: Diagnosis not present

## 2021-12-29 DIAGNOSIS — F09 Unspecified mental disorder due to known physiological condition: Secondary | ICD-10-CM | POA: Diagnosis not present

## 2021-12-29 DIAGNOSIS — S069X9S Unspecified intracranial injury with loss of consciousness of unspecified duration, sequela: Secondary | ICD-10-CM | POA: Diagnosis not present
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
## 2021-12-29 NOTE — TOC Progression Note (Signed)
Transition of Care [facility]) - Progression Note    Patient Details  Name: Vaiden Adames MRN: [id number] Date of Birth: 1960/12/30  Transition of Care [facility]) CM/SW Contact  Maree Krabbe, LCSW Phone Number: 12/29/2021, 8:52 AM  Clinical Narrative:   APS is actively discussing if they will proceed with guardianship. Pt is on the wait list at [facility].    Expected Discharge Plan: Group Home Barriers to Discharge: Continued Medical Work up  Expected Discharge Plan and Services Expected Discharge Plan: Group Home In-house Referral: Clinical Social Work     Living arrangements for the past 2 months: Group Home                                       Social Determinants of Health (SDOH) Interventions    Readmission Risk Interventions No flowsheet data found.

## 2021-12-29 NOTE — Progress Notes (Signed)
Mobility Specialist - Progress Note   12/29/21 1500  Mobility  Activity Ambulated independently in hallway;Ambulated independently in room;Stood at bedside;Dangled on edge of bed  Range of Motion/Exercises Active  Level of Assistance Standby assist, set-up cues, supervision of patient - no hands on  Assistive Device None  Distance Ambulated (ft) 70 ft  Activity Response Tolerated well  Transport method Ambulatory  $Mobility charge 1 Mobility    Pt participated in ADL tasks: bathing at sink, washing face, and washing hair with set-up assist. Pt able to perform UB/LB dressing independently. Pt then ambulated in hallway. Pt left EOB with sitter present.    Filiberto Pinks Mobility Specialist 12/29/21, 3:38 PM
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
## 2021-12-29 NOTE — TOC Progression Note (Addendum)
Transition of Care [facility]) - Progression Note    Patient Details  Name: Adam [name] MRN: [id number] Date of Birth: 12-Jul-1961  Transition of Care [facility]) CM/SW Contact  Maree Krabbe, LCSW Phone Number: 12/29/2021, 12:14 PM  Clinical Narrative:  CSW unable to reach Joy by phone. CSW emailed Joy a secure email message stating the event in which occurred yesterday Expressing the increase in behaviors and the affect on staff and patients surrounding. CSW asking for a update on APS plan. CSW will continue to reach out to [place].     Expected Discharge Plan: Group Home Barriers to Discharge: Continued Medical Work up  Expected Discharge Plan and Services Expected Discharge Plan: Group Home In-house Referral: Clinical Social Work     Living arrangements for the past 2 months: Group Home                                       Social Determinants of Health (SDOH) Interventions    Readmission Risk Interventions No flowsheet data found.

## 2021-12-29 NOTE — Progress Notes (Signed)
AD was called to patient room by nurse tech Albertine Patricia.  Nurse tech verbalized that when walking with patient around the unit, the patient went into the clean utility room. The nurse tech was unsuccessful in redirecting the patient. Once the nurse tech and patient went back into his room the NT noticed he had razors in his hand.NT called for AD to come.  AD went in the patient room and asked patient to "hand over the razors" ,patient replied NO.  AD sternly asked again for patient to hand over razors again and patient gave the AD the three razors in his hand.

## 2021-12-29 NOTE — Progress Notes (Signed)
Nurse tech [place], [place] was relieving the sitter in room 223.  While sitting in room 223, NT heard the patient masturbating in the bathroom.  Nurse tech called for help when AD arrived to the room patient was laying quietly across the bed.

## 2021-12-30 DIAGNOSIS — G3189 Other specified degenerative diseases of nervous system: Secondary | ICD-10-CM | POA: Diagnosis not present

## 2021-12-30 DIAGNOSIS — F09 Unspecified mental disorder due to known physiological condition: Secondary | ICD-10-CM | POA: Diagnosis not present

## 2021-12-30 DIAGNOSIS — S069X9S Unspecified intracranial injury with loss of consciousness of unspecified duration, sequela: Secondary | ICD-10-CM | POA: Diagnosis not present

## 2021-12-30 DIAGNOSIS — F209 Schizophrenia, unspecified: Secondary | ICD-10-CM | POA: Diagnosis not present

## 2021-12-30 MED ORDER — ZIPRASIDONE MESYLATE 20 MG IM SOLR
10.0000 mg | Freq: Once | INTRAMUSCULAR | Status: DC
  Filled 2021-12-30: qty 20

## 2021-12-30 NOTE — Progress Notes (Signed)
Mobility Specialist - Progress Note   12/30/21 1500  Mobility  Activity Ambulated independently in hallway;Ambulated independently in room;Stood at bedside  Level of Assistance Standby assist, set-up cues, supervision of patient - no hands on  Assistive Device None  Distance Ambulated (ft) 360 ft  Activity Response Tolerated well  Transport method Ambulatory  $Mobility charge 1 Mobility    Pt was a little frustrated on arrival, but was easy to redirect. Pt ambulated in hallway and made bed once returned to room. Pt left supine and began to fall asleep. Sitter present at exit.  Filiberto Pinks Mobility Specialist 12/30/21, 3:24 PM
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
## 2021-12-30 NOTE — Progress Notes (Signed)
°   12/30/21 0925  Clinical Encounter Type  Visited With Health care provider  Visit Type Follow-up;Other (Comment) (staff\ support)   Chaplain Amy Burris checked-in with Remi Deter, RN to inform him that she is the chaplain on-call today/tonight and has an established relationship with [name]. Offering ongoing staff support and happy to engage with Pt any time it may be helpful. Also available to listen to staff experiences and be supportive in anyway (stress relief, emotional or spiritual needs). Page the on-call chaplain or call directly (private/staff use only): [phone]

## 2021-12-30 NOTE — Progress Notes (Signed)
Patient c/o pain in his leg. Asked him to show me where. Patient proceeded to pull his pants down and play with his genitals.
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
## 2021-12-31 DIAGNOSIS — F09 Unspecified mental disorder due to known physiological condition: Secondary | ICD-10-CM

## 2021-12-31 DIAGNOSIS — G3189 Other specified degenerative diseases of nervous system: Secondary | ICD-10-CM | POA: Diagnosis not present

## 2021-12-31 DIAGNOSIS — Z66 Do not resuscitate: Secondary | ICD-10-CM | POA: Diagnosis not present

## 2021-12-31 DIAGNOSIS — S069XAS Unspecified intracranial injury with loss of consciousness status unknown, sequela: Secondary | ICD-10-CM

## 2021-12-31 DIAGNOSIS — F209 Schizophrenia, unspecified: Secondary | ICD-10-CM | POA: Diagnosis not present

## 2021-12-31 DIAGNOSIS — G259 Extrapyramidal and movement disorder, unspecified: Secondary | ICD-10-CM

## 2021-12-31 NOTE — Assessment & Plan Note (Addendum)
Previously this admission. Now Stable.
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
## 2022-01-01 DIAGNOSIS — F209 Schizophrenia, unspecified: Secondary | ICD-10-CM | POA: Diagnosis not present

## 2022-01-01 NOTE — Progress Notes (Signed)
Occupational Therapy Discharge Patient Details Name: Adam [name] MRN: [id number] DOB: Nov 03, 1961 Today's Date: 01/01/2022     Patient discharged from OT services secondary to patient has met 3/4 goals and appears near baseline functioning. Pt demonstrates poor carryover from session to session and frequently self-limits session 2/2 agitated behaviours. Pt continues to be limited by baseline impaired communication.   Please see latest therapy progress note for current level of functioning and progress toward goals.    Progress and discharge plan discussed with patient and/or caregiver: Patient unable to participate in discharge planning and no caregivers available. Pt seen ambulating in hall and using telephone to call family member. Attempted to engage in session and pt appears agitated, notified staff of plan to d/c from therapy.   [name], M.S. OTR/L  01/01/22, 10:24 AM  ascom [phone]
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
## 2022-01-02 DIAGNOSIS — F209 Schizophrenia, unspecified: Secondary | ICD-10-CM | POA: Diagnosis not present

## 2022-01-02 MED ORDER — PALIPERIDONE PALMITATE ER 156 MG/ML IM SUSY
156.0000 mg | PREFILLED_SYRINGE | Freq: Once | INTRAMUSCULAR | Status: AC
  Administered 2022-01-02: 156 mg via INTRAMUSCULAR
  Filled 2022-01-02: qty 1

## 2022-01-02 MED ORDER — PALIPERIDONE PALMITATE ER 234 MG/1.5ML IM SUSY
234.0000 mg | PREFILLED_SYRINGE | INTRAMUSCULAR | Status: DC
  Administered 2022-01-30 – 2022-02-27 (×2): 234 mg via INTRAMUSCULAR
  Filled 2022-01-02 (×2): qty 1.5

## 2022-01-02 MED ORDER — CLONAZEPAM 0.5 MG PO TABS
0.5000 mg | ORAL_TABLET | Freq: Three times a day (TID) | ORAL | Status: DC
  Administered 2022-01-02 – 2022-01-21 (×56): 0.5 mg via ORAL
  Filled 2022-01-02 (×56): qty 1

## 2022-01-02 NOTE — Assessment & Plan Note (Addendum)
Hx of stroke.  Seems to have significant aphasia, otherwise no apparent deficits. - Continue ASA, statin.

## 2022-01-02 NOTE — Progress Notes (Signed)
Mobility Specialist - Progress Note   01/02/22 1600  Mobility  Activity Ambulated independently in room  Level of Assistance Standby assist, set-up cues, supervision of patient - no hands on  Assistive Device None  Distance Ambulated (ft) 40 ft  Activity Response Tolerated well  Transport method Ambulatory  $Mobility charge 1 Mobility     Pt ambulating in room and dancing to music. Pt easily redirectable. Pt left in chair with sitter present.    Adam [name] Mobility Specialist 01/02/22, 4:52 PM
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
## 2022-01-02 NOTE — Progress Notes (Signed)
[facility] MD Progress Note  01/02/2022 3:26 PM Adam [name]  MRN:  [id number] Subjective: Follow-up for this 61 year old man with a history of chronic severe mental impairment who is currently boarding on the medical service.  Patient as usual was not really able to provide any lucid information.  The nurse sitting with the patient said that he had been fairly well behaved today.  The patient was indicating his abdomen several times.  Nurse tells me she believes that the patient thinks he has a baby.  Today he was staying in his room and was reasonably quiet although he apparently continues to be a regular behavior problem being out in the hallway at times agitated and of course has a history of getting aggressive with the staff at times.  Medicines were all reviewed.  As of today he was on a combination of medicines aimed at controlling his behavior including haloperidol 5 mg 3 times a day, Invega long-acting injectable 156 mg IM last given a week ago, phenobarbital 3 times a day, Klonopin during the mid day.  I attempted to call his mother on the telephone to talk about his history and about possible treatment options.  The phone went to voicemail and said that there was no room to leave a message. 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  He came to the emergency room originally with agitation and having been  expelled from a group home.  We have very little written information about him and only slightly larger amount of information from his mother.  Patient we are told was diagnosed with schizophrenia when he was young but also had a severe global stroke that caused aphasia several years ago.  Has had a history of agitation at times.  Mother had reported previously multiple medication trials with only partial improvement and that the long-acting Invega had been as good as anything he had taken.  Since being in our facility he has been very labile.  At times he will become physically agitated and sometimes even aggressive with staff staff, rarely with other patients.  Since being on the medical service has had several episodes of this.  Because of his aphasia it is very difficult to tell exactly what his thought patterns are but he does at times seem to still be delusional.  Patient had a myocardial infarction and then at the end of December and has been on the medical service now for the past month.  Past Medical History:  Past Medical History:  Diagnosis Date   Myocardial infarction (HCC)    Schizophrenia (HCC)    Stroke (HCC)    TBI (traumatic brain injury)    History reviewed. No pertinent surgical history. Family History: History reviewed. No pertinent family history. Family Psychiatric  History: No information available Social History:  Social History   Substance and Sexual Activity  Alcohol Use Not Currently     Social History   Substance and Sexual Activity  Drug Use Not  Currently    Social History   Socioeconomic History   Marital status: Single    Spouse name: Not on file   Number of children: Not on file   Years of education: Not on file   Highest education level: Not on file  Occupational History   Not on file  Tobacco Use   Smoking status: Never    Passive exposure: Never   Smokeless tobacco: Never  Vaping Use   Vaping Use: Unknown  Substance and Sexual Activity    Alcohol use: Not Currently   Drug use: Not Currently   Sexual activity: Not Currently  Other Topics Concern   Not on file  Social History Narrative   Not on file   Social Determinants of Health   Financial Resource Strain: Not on file  Food Insecurity: Not on file  Transportation Needs: Not on file  Physical Activity: Not on file  Stress: Not on file  Social Connections: Not on file   Additional Social History:    Pain Medications: See PTA Prescriptions: See PTA Over the Counter: See PTA History of alcohol / drug use?: No history of alcohol / drug abuse                    Sleep: Fair  Appetite:  Fair  Current Medications: Current Facility-Administered Medications  Medication Dose Route Frequency Provider Last Rate Last Admin   acetaminophen (TYLENOL) tablet 650 mg  650 mg Oral Q4H PRN Carollee Herter, DO   650 mg at 01/01/22 0041   aspirin chewable tablet 81 mg  81 mg Oral Daily Lolita Patella B, MD   81 mg at 01/02/22 9937   atorvastatin (LIPITOR) tablet 40 mg  40 mg Oral Daily Lorine Bears A, MD   40 mg at 01/01/22 2126   benztropine (COGENTIN) tablet 0.5 mg  0.5 mg Oral BID Carollee Herter, DO   0.5 mg at 01/02/22 1696   bisacodyl (DULCOLAX) suppository 10 mg  10 mg Rectal Daily PRN Lolita Patella B, MD       clonazePAM (KLONOPIN) tablet 0.5 mg  0.5 mg Oral TID Kindra Bickham T, MD       diphenhydrAMINE (BENADRYL) capsule 50 mg  50 mg Oral Q6H PRN Gabriel Cirri F, NP   50 mg at 01/01/22 0041   Or   diphenhydrAMINE (BENADRYL) injection 50 mg  50 mg Intramuscular Q6H PRN Gabriel Cirri F, NP   50 mg at 12/31/21 1418   enoxaparin (LOVENOX) injection 40 mg  40 mg Subcutaneous Q24H Ronnald Ramp, RPH   40 mg at 01/01/22 2127   famotidine (PEPCID) tablet 20 mg  20 mg Oral BID PRN Carollee Herter, DO   20 mg at 11/15/21 7893   folic acid (FOLVITE) tablet 1 mg  1 mg Oral Daily Georgeann Oppenheim, Sudheer B, MD   1 mg at 01/02/22 8101   gabapentin (NEURONTIN) capsule 300 mg  300  mg Oral TID Carollee Herter, DO   300 mg at 01/02/22 0831   haloperidol (HALDOL) tablet 5 mg  5 mg Oral TID [place], Eric J, DO   5 mg at 01/02/22 7510   haloperidol (HALDOL) tablet 5 mg  5 mg Oral Q6H PRN Gabriel Cirri F, NP   5 mg at 12/31/21 1428   Or   haloperidol lactate (HALDOL) injection 5 mg  5 mg Intramuscular Q6H PRN Gabriel Cirri F, NP   5 mg at 12/30/21 0737   LORazepam (ATIVAN) injection 1 mg  1 mg Intravenous Q4H PRN Carollee Herter, DO   1 mg at 12/04/21 1916   LORazepam (ATIVAN) injection 2 mg  2 mg Intramuscular Q6H PRN Darlin Priestly, MD   2 mg at 12/30/21 2259   LORazepam (ATIVAN) tablet 1 mg  1 mg Oral TID Lolita Patella B, MD   1 mg at 01/02/22 0831   MEDLINE mouth rinse  15 mL Mouth Rinse BID Darlin Priestly, MD   15 mL at 01/02/22 0034   metoprolol succinate (TOPROL-XL) 24 hr tablet 25 mg  25 mg Oral Daily Darlin Priestly, MD   25 mg at 01/02/22 9179   multivitamin with minerals tablet 1 tablet  1 tablet Oral Daily Carollee Herter, DO   1 tablet at 01/02/22 0834   nitroGLYCERIN (NITROSTAT) SL tablet 0.4 mg  0.4 mg Sublingual Q5 Min x 3 PRN Carollee Herter, DO       ondansetron [facility]) injection 4 mg  4 mg Intravenous Q6H PRN Carollee Herter, DO       paliperidone (INVEGA SUSTENNA) injection 156 mg  156 mg Intramuscular Once Analis Distler, [name], MD       Melene Muller ON 01/30/2022] paliperidone (INVEGA SUSTENNA) injection 234 mg  234 mg Intramuscular Q28 days Katilyn Miltenberger, [name], MD       PHENobarbital (LUMINAL) tablet 32.4 mg  32.4 mg Oral TID Darlin Priestly, MD   32.4 mg at 01/02/22 1505   polyethylene glycol (MIRALAX / GLYCOLAX) packet 17 g  17 g Oral BID Lolita Patella B, MD   17 g at 01/01/22 1003   temazepam (RESTORIL) capsule 15 mg  15 mg Oral QHS Carollee Herter, DO   15 mg at 01/01/22 2127   thiamine tablet 100 mg  100 mg Oral Daily Sreenath, Sudheer B, MD   100 mg at 01/02/22 0831   ziprasidone (GEODON) injection 10 mg  10 mg Intramuscular Once Pennie Banter, DO        Lab Results: No results found for this or  any previous visit (from the past 48 hour(s)).  Blood Alcohol level:  Lab Results  Component Value Date   ETH <10 07/20/2021    Metabolic Disorder Labs: No results found for: HGBA1C, MPG No results found for: PROLACTIN Lab Results  Component Value Date   CHOL 167 11/20/2021   TRIG 107 11/20/2021   HDL 26 (L) 11/20/2021   CHOLHDL 6.4 11/20/2021   VLDL 21 11/20/2021   LDLCALC 120 (H) 11/20/2021    Physical Findings: AIMS:  , ,  ,  ,    CIWA:    COWS:     Musculoskeletal: Strength & Muscle Tone: within normal limits Gait & Station: normal Patient leans: N/A  Psychiatric Specialty Exam:  Presentation  General Appearance: Disheveled  Eye Contact:None  Speech:Clear and Coherent  Speech Volume:Increased  Handedness:Right   Mood and Affect  Mood:Anxious; Irritable  Affect:Congruent   Thought Process  Thought Processes:Disorganized  Descriptions of Associations:Loose  Orientation:Partial  Thought Content:Scattered; Illogical  History of Schizophrenia/Schizoaffective disorder:Yes  Duration of Psychotic Symptoms:Greater than six months  Hallucinations:No data recorded Ideas of Reference:-- (UTA)  Suicidal Thoughts:No data recorded Homicidal Thoughts:No data recorded  Sensorium  Memory:Remote Poor; Recent Poor; Immediate Poor; Other (comment) (impaired memory and cognition)  Judgment:Impaired  Insight:Lacking   Executive Functions  Concentration:Poor  Attention Span:Poor  Recall:Poor  Fund of Knowledge:Poor  Language:Poor   Psychomotor Activity  Psychomotor Activity:No data recorded  Assets  Assets:Housing; Resilience; Social Support; Talents/Skills   Sleep  Sleep:No  data recorded   Physical Exam: Physical Exam Vitals and nursing note reviewed.  Constitutional:      Appearance: Normal appearance.  HENT:     Head: Normocephalic and atraumatic.     Mouth/Throat:     Pharynx: Oropharynx is clear.  Eyes:     Pupils: Pupils  are equal, round, and reactive to light.  Cardiovascular:     Rate and Rhythm: Normal rate and regular rhythm.  Pulmonary:     Effort: Pulmonary effort is normal.     Breath sounds: Normal breath sounds.  Abdominal:     General: Abdomen is flat.     Palpations: Abdomen is soft.  Musculoskeletal:        General: Normal range of motion.  Skin:    General: Skin is warm and dry.  Neurological:     General: No focal deficit present.     Mental Status: He is alert. Mental status is at baseline.  Psychiatric:        Attention and Perception: He is inattentive.        Mood and Affect: Mood normal. Affect is labile and inappropriate.        Speech: He is noncommunicative.        Behavior: Behavior is agitated. Behavior is not aggressive.        Thought Content: Thought content is delusional.        Cognition and Memory: Cognition is impaired.        Judgment: Judgment is inappropriate.   Review of Systems  Unable to perform ROS: Psychiatric disorder  Blood pressure 106/73, pulse 85, temperature (!) 97.4 F (36.3 C), temperature source Oral, resp. rate 17, height 5\' 8"  (1.727 m), weight 80 kg, SpO2 100 %. Body mass index is 26.82 kg/m.   Treatment Plan Summary: Medication management and Plan patient is now medically stable and no longer requires inpatient medical level care but has been a very difficult placement.  Medicines reviewed.  I am going to suggest a couple of changes.  He used to be on a higher dose of Invega long-acting injectable so I am going to give him another 156 mg injection today and then switch the monthly to the 234 mg dose.  I am going to increase his clonazepam from just at lunchtime to 3 times a day.  There are of course other medication trials we could pursue with the patient although not clear how much benefit there would be.  I do not know whether he is ever been on clozapine and it might even be worth trying that especially since he is in a controlled setting.  I  would need to get permission from his mother and as I mentioned I could not reach her on the phone today.  I will try to continue to keep an eye on the situation.  Patient's primary need is for placement.  Mordecai Rasmussen, MD 01/02/2022, 3:26 PM

## 2022-01-03 DIAGNOSIS — F209 Schizophrenia, unspecified: Secondary | ICD-10-CM | POA: Diagnosis not present

## 2022-01-03 NOTE — Plan of Care (Signed)
  Problem: Health Behavior/Discharge Planning: Goal: Ability to manage health-related needs will improve Outcome: Progressing   

## 2022-01-03 NOTE — Progress Notes (Signed)
Progress Note    Adam [name]  UXN:[id number] DOB: 1961/07/06  DOA: 07/20/2021 PCP: Pcp, No      Brief Narrative:    Medical records reviewed and are as summarized below:  Adam [name] is a 61 y.o. male with medical history significant for schizophrenia and TBI, who was initially admitted to [facility] ER on July 20, 2021. He has spent more than 123 days in the [facility] ED awaiting psychiatric placement. He was initially brought to [facility] ER due to violent outbursts at his group home. He was IVC'd initially.  Due to difficulty in placing him into facility, pt has essentially been living in the [facility] ER.  He was noted to be lethargic 12/19, found to have NSTEMI.  Pt was admitted to hospitalist service. Cardiology consulted who recommended no intervention.    After admission to the medical service patient became agitated on the floor.  Wandering behavior, going into the patient's rooms.  Security called number of times which seemed to exacerbate the situation.  Multiple reengagement of psychiatry with no further recommendation and refusal to accept pt to inpatient psych service.  Senior management involved.  Night covering provider transferred pt to ICU night of 12/24 for precedex gtt due to severe agitation and combative behavior.  PCCM was consulted.  Precedex felt to be ineffective and was turned off.  Pt was transferred back to floor on 1/4. Patient remains difficult to redirect, increasingly agitated.    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   Schizophrenia  with acute exacerbation: Continue psychotropics.  Follow-up with psychiatrist.  Acute NSTEMI: Managed conservatively.  Continue aspirin, metoprolol and Lipitor.  Acute metabolic encephalopathy: Continue supportive care  Acute pulmonary edema: Resolved  History of stroke  Diet Order             Diet regular Room service appropriate? Yes; Fluid consistency: Thin  Diet effective now                      Consultants: Psychiatrist Intensivist Cardiologist  Procedures: None    Medications:    aspirin  81 mg Oral Daily   atorvastatin  40 mg Oral Daily   benztropine  0.5 mg Oral BID   clonazePAM  0.5 mg Oral TID   enoxaparin (LOVENOX) injection  40 mg Subcutaneous Q24H   folic acid  1 mg Oral Daily   gabapentin  300 mg Oral TID   haloperidol  5 mg Oral TID   LORazepam  1 mg Oral TID   mouth rinse  15 mL Mouth Rinse BID   metoprolol succinate  25 mg Oral Daily   multivitamin with minerals  1 tablet Oral Daily   [START ON 01/30/2022] paliperidone  234 mg Intramuscular Q28 days   phenobarbital  32.4 mg Oral TID   polyethylene glycol  17 g Oral BID   temazepam  15 mg Oral QHS   thiamine  100 mg Oral Daily   Continuous Infusions:   Anti-infectives (From admission, onward)    None  Family Communication/Anticipated D/C date and plan/Code Status   DVT prophylaxis: enoxaparin (LOVENOX) injection 40 mg Start: 11/23/21 2200 SCDs Start: 11/20/21 2032     Code Status: DNR  Family Communication: None Disposition Plan: Awaiting placement   Status is: Inpatient  Remains inpatient appropriate because: Awaiting legal guardianship and placement         Subjective:   Interval events noted.  He has been ambulating in the hallway with nurse tech.  He is unable to provide any history.  Objective:    Vitals:   12/31/21 1932 01/01/22 2209 01/02/22 0734 01/02/22 1702  BP: 122/74 102/69 106/73 102/68  Pulse: 95 70 85 80  Resp: 18 20 17  17   Temp: 98.2 F (36.8 C) 98.5 F (36.9 C) (!) 97.4 F (36.3 C) 98.1 F (36.7 C)  TempSrc:   Oral Oral  SpO2: 92% 100% 100% 98%  Weight:      Height:       No data found.   Intake/Output Summary (Last 24 hours) at 01/03/2022 1345 Last data filed at 01/03/2022 0200 Gross per 24 hour  Intake 200 ml  Output --  Net 200 ml   Filed Weights   07/20/21 1635 12/07/21 2025  Weight: 79.4 kg 80 kg    Exam:  GEN: NAD SKIN: Warm and dry EYES: No pallor or icterus ENT: MMM CV: RRR PULM: CTA B ABD: soft, ND, NT, +BS CNS: Aphasic with some slurring of the speech and difficult to understand EXT: No edema or tenderness        Data Reviewed:   I have personally reviewed following labs and imaging studies:  Labs: Labs show the following:   Basic Metabolic Panel: No results for input(s): NA, K, CL, CO2, GLUCOSE, BUN, CREATININE, CALCIUM, MG, PHOS in the last 168 hours. GFR Estimated Creatinine Clearance: 83.4 mL/min (by C-G formula based on SCr of 0.9 mg/dL). Liver Function Tests: No results for input(s): AST, ALT, ALKPHOS, BILITOT, PROT, ALBUMIN in the last 168 hours. No results for input(s): LIPASE, AMYLASE in the last 168 hours. No results for input(s): AMMONIA in the last 168 hours. Coagulation profile No results for input(s): INR, PROTIME in the last 168 hours.  CBC: No results for input(s): WBC, NEUTROABS, HGB, HCT, MCV, PLT in the last 168 hours. Cardiac Enzymes: No results for input(s): CKTOTAL, CKMB, CKMBINDEX, TROPONINI in the last 168 hours. BNP (last 3 results) No results for input(s): PROBNP in the last 8760 hours. CBG: No results for input(s): GLUCAP in the last 168 hours. D-Dimer: No results for input(s): DDIMER in the last 72 hours. Hgb A1c: No results for input(s): HGBA1C in the last 72 hours. Lipid Profile: No results for input(s): CHOL, HDL, LDLCALC, TRIG, CHOLHDL, LDLDIRECT in the last 72 hours. Thyroid function studies: No results for input(s):  TSH, T4TOTAL, T3FREE, THYROIDAB in the last 72 hours.  Invalid input(s): FREET3 Anemia work up: No results for input(s): VITAMINB12, FOLATE, FERRITIN, TIBC, IRON, RETICCTPCT in the last 72 hours. Sepsis Labs: No results for input(s): PROCALCITON, WBC, LATICACIDVEN in the last 168 hours.  Microbiology No results found for this or any previous visit (from the past 240 hour(s)).  Procedures and diagnostic studies:  No results found.             LOS: 44 days   Azora Bonzo  [place] 02/04/22 on [email or web address] If 7PM-7AM, please contact night-coverage at [email or web address]     01/03/2022, 1:45 PM

## 2022-01-03 NOTE — TOC Progression Note (Signed)
Transition of Care [facility]) - Progression Note    Patient Details  Name: Adam [name] MRN: [id number] Date of Birth: [birth year]  [facility] CM/SW Contact  Margarito Liner, LCSW Phone Number: 01/03/2022, 10:29 AM  Clinical Narrative:  Received call from DSS social worker Joy Sumpter. She has reviewed case with her supervisor and they will begin process for guardianship. She is working on packet and should have paperwork completed to file by Monday. She did confirm process takes a while but placement should not take as long. Mother will still be decision maker until guardianship obtained.    Expected Discharge Plan: Group Home Barriers to Discharge: Continued Medical Work up  Expected Discharge Plan and Services Expected Discharge Plan: Group Home In-house Referral: Clinical Social Work     Living arrangements for the past 2 months: Group Home                                       Social Determinants of Health (SDOH) Interventions    Readmission Risk Interventions No flowsheet data found.

## 2022-01-03 NOTE — Progress Notes (Signed)
Mobility Specialist - Progress Note   01/03/22 1400  Mobility  Activity Ambulated independently in hallway;Ambulated independently in room  Range of Motion/Exercises Active  Level of Assistance Standby assist, set-up cues, supervision of patient - no hands on  Assistive Device None  Distance Ambulated (ft) 160 ft  Activity Response Tolerated well  Transport method Ambulatory  $Mobility charge 1 Mobility     Pt ambulated in hallway independently. Upon return to room, pt cleaned floor from juice spill and washed hands. Pt left EOB with sitter present.    Filiberto Pinks Mobility Specialist 01/03/22, 2:35 PM

## 2022-01-03 NOTE — Progress Notes (Signed)
[facility] MD Progress Note  01/03/2022 5:19 PM Adam [name]  MRN:  [id number] Subjective: Follow-up for this patient with psychosis and traumatic brain injury.  Patient seen and chart reviewed.  The patient was pacing around his room today.  When I came and he kept drawing my attention to his abdomen which he was grabbing and bouncing up and down.  Patient was unable to articulate what was special about it.  When I asked him if he was constipated he said no.  When I finally asked him if he thought he had a baby and him he very enthusiastically said yes.  This has been a delusion of his in the past.  He was not aggressive or hostile with me.  Able to answer some questions but not in any detail of course.  Nursing reports that this morning he was "off the chain" for a while but had calmed down some in the afternoon. 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 No pertinent surgical history. Family History: History reviewed. No pertinent family history. Family Psychiatric  History: See previous Social History:  Social History   Substance and Sexual Activity  Alcohol Use Not Currently      Social History   Substance and Sexual Activity  Drug Use Not Currently    Social History   Socioeconomic History   Marital status: Single    Spouse name: Not on file   Number of children: Not on file   Years of education: Not on file   Highest education level: Not on file  Occupational History   Not on file  Tobacco Use   Smoking status: Never    Passive exposure: Never   Smokeless tobacco: Never  Vaping Use   Vaping Use: Unknown  Substance and Sexual Activity   Alcohol use: Not Currently   Drug use: Not Currently   Sexual activity: Not Currently  Other Topics Concern   Not on file  Social History Narrative   Not on file   Social Determinants of Health   Financial Resource Strain: Not on file  Food Insecurity: Not on file  Transportation Needs: Not on file  Physical Activity: Not on file  Stress: Not on file  Social Connections: Not on file   Additional Social History:    Pain Medications: See PTA Prescriptions: See PTA Over the Counter: See PTA History of alcohol / drug use?: No history of alcohol / drug abuse                    Sleep: Fair  Appetite:  Fair  Current Medications: Current Facility-Administered Medications  Medication Dose Route Frequency Provider Last Rate Last Admin   acetaminophen (TYLENOL) tablet 650 mg  650  mg Oral Q4H PRN Carollee Herter, DO   650 mg at 01/01/22 0041   aspirin chewable tablet 81 mg  81 mg Oral Daily Lolita Patella B, MD   81 mg at 01/03/22 1006   atorvastatin (LIPITOR) tablet 40 mg  40 mg Oral Daily Lorine Bears A, MD   40 mg at 01/02/22 2128   benztropine (COGENTIN) tablet 0.5 mg  0.5 mg Oral BID Carollee Herter, DO   0.5 mg at 01/03/22 1006   bisacodyl (DULCOLAX) suppository 10 mg  10 mg Rectal Daily PRN Lolita Patella B, MD       clonazePAM Scarlette Calico) tablet 0.5 mg  0.5 mg Oral TID Vue Pavon T, MD   0.5 mg at 01/03/22 1545   diphenhydrAMINE (BENADRYL) capsule 50 mg  50 mg Oral Q6H PRN Vanetta Mulders,  NP   50 mg at 01/01/22 0041   Or   diphenhydrAMINE (BENADRYL) injection 50 mg  50 mg Intramuscular Q6H PRN Vanetta Mulders, NP   50 mg at 01/03/22 1645   enoxaparin (LOVENOX) injection 40 mg  40 mg Subcutaneous Q24H Ronnald Ramp, RPH   40 mg at 01/02/22 2127   famotidine (PEPCID) tablet 20 mg  20 mg Oral BID PRN Carollee Herter, DO   20 mg at 11/15/21 6384   folic acid (FOLVITE) tablet 1 mg  1 mg Oral Daily Lolita Patella B, MD   1 mg at 01/03/22 1005   gabapentin (NEURONTIN) capsule 300 mg  300 mg Oral TID Esaw Grandchild A, DO   300 mg at 01/03/22 1545   haloperidol (HALDOL) tablet 5 mg  5 mg Oral TID [place], Eric J, DO   5 mg at 01/03/22 1545   haloperidol (HALDOL) tablet 5 mg  5 mg Oral Q6H PRN Vanetta Mulders, NP   5 mg at 12/31/21 1428   Or   haloperidol lactate (HALDOL) injection 5 mg  5 mg Intramuscular Q6H PRN Gabriel Cirri F, NP   5 mg at 01/03/22 1645   LORazepam (ATIVAN) injection 1 mg  1 mg Intravenous Q4H PRN Carollee Herter, DO   1 mg at 12/04/21 1916   LORazepam (ATIVAN) injection 2 mg  2 mg Intramuscular Q6H PRN Darlin Priestly, MD   2 mg at 12/30/21 2259   LORazepam (ATIVAN) tablet 1 mg  1 mg Oral TID Lolita Patella B, MD   1 mg at 01/03/22 1545   MEDLINE mouth rinse  15 mL Mouth Rinse BID Darlin Priestly, MD   15 mL at 01/03/22 1006   metoprolol succinate (TOPROL-XL) 24 hr tablet 25 mg  25 mg Oral Daily Darlin Priestly, MD   25 mg at 01/03/22 1006   multivitamin with minerals tablet 1 tablet  1 tablet Oral Daily Carollee Herter, DO   1 tablet at 01/03/22 1006   nitroGLYCERIN (NITROSTAT) SL tablet 0.4 mg  0.4 mg Sublingual Q5 Min x 3 PRN Carollee Herter, DO       ondansetron [facility]) injection 4 mg  4 mg Intravenous Q6H PRN Carollee Herter, DO       [START ON 01/30/2022] paliperidone (INVEGA SUSTENNA) injection 234 mg  234 mg Intramuscular Q28 days Jamya Starry, [name], MD       PHENobarbital (LUMINAL) tablet 32.4 mg  32.4 mg Oral TID Darlin Priestly, MD   32.4 mg at 01/03/22 1545   polyethylene glycol (MIRALAX /  GLYCOLAX) packet 17 g  17 g Oral BID Lolita Patella B, MD   17 g at  01/03/22 1006   temazepam (RESTORIL) capsule 15 mg  15 mg Oral QHS Carollee Herter, DO   15 mg at 01/02/22 2127   thiamine tablet 100 mg  100 mg Oral Daily Lolita Patella B, MD   100 mg at 01/03/22 1006    Lab Results: No results found for this or any previous visit (from the past 48 hour(s)).  Blood Alcohol level:  Lab Results  Component Value Date   ETH <10 07/20/2021    Metabolic Disorder Labs: No results found for: HGBA1C, MPG No results found for: PROLACTIN Lab Results  Component Value Date   CHOL 167 11/20/2021   TRIG 107 11/20/2021   HDL 26 (L) 11/20/2021   CHOLHDL 6.4 11/20/2021   VLDL 21 11/20/2021   LDLCALC 120 (H) 11/20/2021    Physical Findings: AIMS:  , ,  ,  ,    CIWA:    COWS:     Musculoskeletal: Strength & Muscle Tone: within normal limits Gait & Station: normal Patient leans: N/A  Psychiatric Specialty Exam:  Presentation  General Appearance: Disheveled  Eye Contact:None  Speech:Clear and Coherent  Speech Volume:Increased  Handedness:Right   Mood and Affect  Mood:Anxious; Irritable  Affect:Congruent   Thought Process  Thought Processes:Disorganized  Descriptions of Associations:Loose  Orientation:Partial  Thought Content:Scattered; Illogical  History of Schizophrenia/Schizoaffective disorder:Yes  Duration of Psychotic Symptoms:Greater than six months  Hallucinations:No data recorded Ideas of Reference:-- (UTA)  Suicidal Thoughts:No data recorded Homicidal Thoughts:No data recorded  Sensorium  Memory:Remote Poor; Recent Poor; Immediate Poor; Other (comment) (impaired memory and cognition)  Judgment:Impaired  Insight:Lacking   Executive Functions  Concentration:Poor  Attention Span:Poor  Recall:Poor  Fund of Knowledge:Poor  Language:Poor   Psychomotor Activity  Psychomotor Activity:No data recorded  Assets  Assets:Housing; Resilience;  Social Support; Talents/Skills   Sleep  Sleep:No data recorded   Physical Exam: Physical Exam Vitals and nursing note reviewed.  Constitutional:      Appearance: Normal appearance.  HENT:     Head: Normocephalic and atraumatic.     Mouth/Throat:     Pharynx: Oropharynx is clear.  Eyes:     Pupils: Pupils are equal, round, and reactive to light.  Cardiovascular:     Rate and Rhythm: Normal rate and regular rhythm.  Pulmonary:     Effort: Pulmonary effort is normal.     Breath sounds: Normal breath sounds.  Abdominal:     General: Abdomen is flat.     Palpations: Abdomen is soft.  Musculoskeletal:        General: Normal range of motion.  Skin:    General: Skin is warm and dry.  Neurological:     General: No focal deficit present.     Mental Status: He is alert. Mental status is at baseline.  Psychiatric:        Attention and Perception: He is inattentive.        Mood and Affect: Mood normal. Affect is labile and inappropriate.        Speech: He is noncommunicative.        Behavior: Behavior is agitated.        Thought Content: Thought content is delusional.        Cognition and Memory: Cognition is impaired. Memory is impaired.   Review of Systems  Unable to perform ROS: Psychiatric disorder  Blood pressure 101/65, pulse 80, temperature 97.8 F (36.6 C), temperature source Oral, resp. rate 17, height  (1.727 m), weight 80 kg, SpO2 99 %.  Body mass index is 26.82 kg/m.   Treatment Plan Summary: Medication management and Plan patient continues to be psychotic as well as impaired on multiple levels with ongoing behavior problems.  I recognize that his mother is not his legal guardian and so does not need to give specific permission for medicine changes.  I informed the patient that I would like to change his medicine by adding Clozapine.  I explained that clozapine was a superior antipsychotic used for people who have not responded to medicines such as himself.  Also  explained the need for ongoing blood checks and side effects including constipation and sedation.  Patient indicated agreement to making some medicine change.  I have ordered in a CBC with full differential and if that comes back normal we can start Clozapine tomorrow in addition to his current medicine  Mordecai Rasmussen, MD 01/03/2022, 5:19 PM

## 2022-01-04 DIAGNOSIS — F209 Schizophrenia, unspecified: Secondary | ICD-10-CM | POA: Diagnosis not present

## 2022-01-04 LAB — CBC WITH DIFFERENTIAL/PLATELET
Abs Immature Granulocytes: 0.02 10*3/uL (ref 0.00–0.07)
Basophils Absolute: 0.1 10*3/uL (ref 0.0–0.1)
Basophils Relative: 1 %
Eosinophils Absolute: 0.2 10*3/uL (ref 0.0–0.5)
Eosinophils Relative: 3 %
HCT: 40.8 % (ref 39.0–52.0)
Hemoglobin: 13.7 g/dL (ref 13.0–17.0)
Immature Granulocytes: 0 %
Lymphocytes Relative: 31 %
Lymphs Abs: 1.5 10*3/uL (ref 0.7–4.0)
MCH: 32.6 pg (ref 26.0–34.0)
MCHC: 33.6 g/dL (ref 30.0–36.0)
MCV: 97.1 fL (ref 80.0–100.0)
Monocytes Absolute: 0.4 10*3/uL (ref 0.1–1.0)
Monocytes Relative: 8 %
Neutro Abs: 2.8 10*3/uL (ref 1.7–7.7)
Neutrophils Relative %: 57 %
Platelets: 211 10*3/uL (ref 150–400)
RBC: 4.2 MIL/uL — ABNORMAL LOW (ref 4.22–5.81)
RDW: 14.7 % (ref 11.5–15.5)
WBC: 5 10*3/uL (ref 4.0–10.5)
nRBC: 0 % (ref 0.0–0.2)

## 2022-01-04 LAB — COMPREHENSIVE METABOLIC PANEL
ALT: 23 U/L (ref 0–44)
AST: 23 U/L (ref 15–41)
Albumin: 4.1 g/dL (ref 3.5–5.0)
Alkaline Phosphatase: 129 U/L — ABNORMAL HIGH (ref 38–126)
Anion gap: 6 (ref 5–15)
BUN: 21 mg/dL (ref 8–23)
CO2: 29 mmol/L (ref 22–32)
Calcium: 9.4 mg/dL (ref 8.9–10.3)
Chloride: 105 mmol/L (ref 98–111)
Creatinine, Ser: 1.01 mg/dL (ref 0.61–1.24)
GFR, Estimated: >60 mL/min (ref >60)
Glucose, Bld: 95 mg/dL (ref 70–99)
Potassium: 4.3 mmol/L (ref 3.5–5.1)
Sodium: 140 mmol/L (ref 135–145)
Total Bilirubin: 0.5 mg/dL (ref 0.3–1.2)
Total Protein: 7.3 g/dL (ref 6.5–8.1)

## 2022-01-04 LAB — MAGNESIUM: Magnesium: 2 mg/dL (ref 1.7–2.4)

## 2022-01-04 LAB — PHOSPHORUS: Phosphorus: 3.9 mg/dL (ref 2.5–4.6)

## 2022-01-04 MED ORDER — CLOZAPINE 25 MG PO TABS
25.0000 mg | ORAL_TABLET | Freq: Every day | ORAL | Status: DC
  Administered 2022-01-05 – 2022-01-07 (×3): 25 mg via ORAL
  Filled 2022-01-04 (×4): qty 1

## 2022-01-04 MED ORDER — ZIPRASIDONE MESYLATE 20 MG IM SOLR
10.0000 mg | Freq: Once | INTRAMUSCULAR | Status: AC
  Administered 2022-01-04: 10 mg via INTRAMUSCULAR
  Filled 2022-01-04: qty 20

## 2022-01-04 NOTE — Progress Notes (Addendum)
Progress Note    Adam [name]  KAJ:[id number] DOB: Jan 08, 1961  DOA: 07/20/2021 PCP: Pcp, No      Brief Narrative:    Medical records reviewed and are as summarized below:  Adam [name] is a 61 y.o. male with medical history significant for schizophrenia and TBI, who was initially admitted to [facility] ER on July 20, 2021. He has spent more than 123 days in the [facility] ED awaiting psychiatric placement. He was initially brought to [facility] ER due to violent outbursts at his group home. He was IVC'd initially.  Due to difficulty in placing him into facility, pt has essentially been living in the [facility] ER.  He was noted to be lethargic 12/19, found to have NSTEMI.  Pt was admitted to hospitalist service. Cardiology consulted who recommended no intervention.    After admission to the medical service patient became agitated on the floor.  Wandering behavior, going into the patient's rooms.  Security called number of times which seemed to exacerbate the situation.  Multiple reengagement of psychiatry with no further recommendation and refusal to accept pt to inpatient psych service.  Senior management involved.  Night covering provider transferred pt to ICU night of 12/24 for precedex gtt due to severe agitation and combative behavior.  PCCM was consulted.  Precedex felt to be ineffective and was turned off.  Pt was transferred back to floor on 1/4. Patient remains difficult to redirect, increasingly agitated.    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   Schizophrenia  with acute exacerbation: Continue psychotropics.  Follow-up with psychiatrist.  CBC and BMP  were ordered but he refused blood work today.  Acute NSTEMI: Managed conservatively.  Continue aspirin, metoprolol and Lipitor.  Acute metabolic encephalopathy: Continue supportive care  Acute pulmonary edema: Resolved  History of stroke  Diet Order             Diet regular Room service appropriate? Yes; Fluid consistency: Thin  Diet effective now                      Consultants: Psychiatrist Intensivist Cardiologist  Procedures: None    Medications:    aspirin  81 mg Oral Daily   atorvastatin  40 mg Oral Daily   benztropine  0.5 mg Oral BID   clonazePAM  0.5 mg Oral TID   enoxaparin (LOVENOX) injection  40 mg Subcutaneous Q24H   folic acid  1 mg Oral Daily   gabapentin  300 mg Oral TID   haloperidol  5 mg Oral TID   LORazepam  1 mg Oral TID   mouth rinse  15 mL Mouth Rinse BID   metoprolol succinate  25 mg Oral Daily   multivitamin with minerals  1 tablet Oral Daily   [START ON 01/30/2022] paliperidone  234 mg Intramuscular Q28 days   phenobarbital  32.4 mg Oral TID   polyethylene glycol  17 g Oral BID   temazepam  15 mg Oral QHS   thiamine  100 mg Oral Daily   Continuous Infusions:   Anti-infectives (From admission,  onward)    None              Family Communication/Anticipated D/C date and plan/Code Status   DVT prophylaxis: enoxaparin (LOVENOX) injection 40 mg Start: 11/23/21 2200 SCDs Start: 11/20/21 2032     Code Status: DNR  Family Communication: None Disposition Plan: Awaiting placement   Status is: Inpatient  Remains inpatient appropriate because: Awaiting legal guardianship and placement         Subjective:   Interval events noted.  He has been ambulating in the hallways accompanied by the nurse tech and mobility tech.  He has been difficult to redirect but otherwise no acute issues noted.  Objective:    Vitals:    01/02/22 1702 01/03/22 1554 01/03/22 2100 01/04/22 0725  BP: 102/68 101/65 92/68 117/76  Pulse: 80 80 83 75  Resp: 17 17 18  (!) 21  Temp: 98.1 F (36.7 C) 97.8 F (36.6 C) (!) 97.5 F (36.4 C)   TempSrc: Oral Oral Oral   SpO2: 98% 99% 98% 99%  Weight:      Height:       No data found.  No intake or output data in the 24 hours ending 01/04/22 1408  Filed Weights   07/20/21 1635 12/07/21 2025  Weight: 79.4 kg 80 kg    Exam:  GEN: NAD SKIN: Warm and dry EYES: EOMI ENT: MMM CV: RRR PULM: CTA B ABD: soft, ND, NT, +BS CNS: Aphasic and difficult to understand EXT: No edema or tenderness          Data Reviewed:   I have personally reviewed following labs and imaging studies:  Labs: Labs show the following:   Basic Metabolic Panel: No results for input(s): NA, K, CL, CO2, GLUCOSE, BUN, CREATININE, CALCIUM, MG, PHOS in the last 168 hours. GFR Estimated Creatinine Clearance: 83.4 mL/min (by C-G formula based on SCr of 0.9 mg/dL). Liver Function Tests: No results for input(s): AST, ALT, ALKPHOS, BILITOT, PROT, ALBUMIN in the last 168 hours. No results for input(s): LIPASE, AMYLASE in the last 168 hours. No results for input(s): AMMONIA in the last 168 hours. Coagulation profile No results for input(s): INR, PROTIME in the last 168 hours.  CBC: Recent Labs  Lab 01/04/22 1345  WBC 5.0  NEUTROABS 2.8  HGB 13.7  HCT 40.8  MCV 97.1  PLT 211   Cardiac Enzymes: No results for input(s): CKTOTAL, CKMB, CKMBINDEX, TROPONINI in the last 168 hours. BNP (last 3 results) No results for input(s): PROBNP in the last 8760 hours. CBG: No results for input(s): GLUCAP in the last 168 hours. D-Dimer: No results for input(s): DDIMER in the last 72 hours. Hgb A1c: No results for input(s): HGBA1C in the last 72 hours. Lipid Profile: No results for input(s): CHOL, HDL, LDLCALC, TRIG, CHOLHDL, LDLDIRECT in the last 72 hours. Thyroid function studies: No results for  input(s): TSH, T4TOTAL, T3FREE, THYROIDAB in the last 72 hours.  Invalid input(s): FREET3 Anemia work up: No results for input(s): VITAMINB12, FOLATE, FERRITIN, TIBC, IRON, RETICCTPCT in the last 72 hours. Sepsis Labs: Recent Labs  Lab 01/04/22 1345  WBC 5.0    Microbiology No results found for this or any previous visit (from the past 240 hour(s)).  Procedures and diagnostic studies:  No results found.             LOS: 45 days   Alyia Lacerte  [employer] loss adjuster on [email or web address]. If 7PM-7AM, please contact night-coverage at [email or web address]  01/04/2022, 2:08 PM

## 2022-01-04 NOTE — Consult Note (Signed)
°  Psychiatry consult: Follow-up 61 year old man with schizophrenia or schizoaffective disorder, history of brain injury and severe chronic behavior problems.  CBC was obtained today and showed an absolute neutrophil count of 2800.  I have enrolled the patient in the Clozapine rems program.  I will be starting him on clozapine 25 mg by mouth at bedtime tonight with plans to gradually increase as tolerated.  No change to other psychiatric medicine at this point.  Target is disorganized thinking and bizarre disruptive behavior.

## 2022-01-04 NOTE — Progress Notes (Signed)
Mobility Specialist - Progress Note   01/04/22 1100  Mobility  Activity Ambulated with assistance in hallway;Ambulated with assistance to bathroom  Level of Assistance Standby assist, set-up cues, supervision of patient - no hands on  Assistive Device None  Distance Ambulated (ft) 200 ft  Activity Response Tolerated well  Transport method Ambulatory  $Mobility charge 1 Mobility    Pt ambulated in hallway with supervision. A little unsteady and restless this date, but is compliant most times. Pt ambulated to bathroom for urinal output. Pt returned to chair with sitter present.    Filiberto Pinks Mobility Specialist 01/04/22, 11:40 AM

## 2022-01-05 DIAGNOSIS — F209 Schizophrenia, unspecified: Secondary | ICD-10-CM | POA: Diagnosis not present

## 2022-01-05 MED ORDER — SIMETHICONE 80 MG PO CHEW
80.0000 mg | CHEWABLE_TABLET | Freq: Once | ORAL | Status: AC
  Administered 2022-01-05: 80 mg via ORAL
  Filled 2022-01-05: qty 1

## 2022-01-05 MED ORDER — DICYCLOMINE HCL 10 MG PO CAPS
10.0000 mg | ORAL_CAPSULE | Freq: Once | ORAL | Status: AC
  Administered 2022-01-05: 10 mg via ORAL
  Filled 2022-01-05: qty 1

## 2022-01-05 NOTE — Consult Note (Signed)
°  Psychiatry follow-up: Patient seen briefly.  No significant change.  Staff reports that patient was still agitated in the morning requiring security to be present.  When I came to see him he was irritable but not hostile or threatening and was able to be redirected.  Clozapine was not started last night but the plan is to start 25 mg tonight as his absolute neutrophil count was well within the normal range.

## 2022-01-05 NOTE — Progress Notes (Signed)
Contacted by NT to be made known that patient was outside the unit screaming and yelling at visitors and staff again. He was unable to be redirected back to room despite various attempts by different staff members. Patient continues to scream at staff. Security was contacted and directed patient backto room. IM Prns were given and MD was notified. Patient still continues to scream in room at this time. Security is present at this time.

## 2022-01-05 NOTE — Progress Notes (Signed)
Mobility Specialist - Progress Note   01/05/22 1400  Mobility  Activity Ambulated independently in hallway  Level of Assistance Standby assist, set-up cues, supervision of patient - no hands on  Assistive Device None  Distance Ambulated (ft) 180 ft  Activity Response Tolerated well  Transport method Ambulatory  $Mobility charge 1 Mobility    Pt ambulated in hallway with supervision. Redirection needed as pt is agitated, but mostly compliant with commands. Pt left with sitter.    Filiberto Pinks Mobility Specialist 01/05/22, 2:10 PM

## 2022-01-05 NOTE — Progress Notes (Signed)
Pharmacy Consult - Clozapine     61 yo male ordered clozapine 25 mg PO QHS  This patient's order has been reviewed for prescribing contraindications.    Clozapine REMS enrollment Verified: yes on 01/04/22  REMS patient ID: [id number] Current Outpatient Monitoring: Monthly    Home Regimen:  New Start   Dose Adjustments This Admission: 2/3 started on clozapine 25mg     Labs: Date: 01/04/22    ANC: 2800   Submitted: 01/04/22      Plan: Patient is approved to receive clozapine with weekly monitoring  Dispense authorization obtained from REMs online  Begin clozapine 25mg  QHS Monitor ANC at least weekly while inpatient    03/04/22, PharmD, BCPS Clinical Pharmacist 01/05/2022 7:21 AM

## 2022-01-05 NOTE — Progress Notes (Signed)
Progress Note    Adam [name]  HMC:[id number] DOB: July 25, 1961  DOA: 07/20/2021 PCP: Pcp, No      Brief Narrative:    Medical records reviewed and are as summarized below:  Adam [name] is a 61 y.o. male with medical history significant for schizophrenia and TBI, who was initially admitted to [facility] ER on July 20, 2021. He has spent more than 123 days in the [facility] ED awaiting psychiatric placement. He was initially brought to [facility] ER due to violent outbursts at his group home. He was IVC'd initially.  Due to difficulty in placing him into facility, pt has essentially been living in the [facility] ER.  He was noted to be lethargic 12/19, found to have NSTEMI.  Pt was admitted to hospitalist service. Cardiology consulted who recommended no intervention.    After admission to the medical service patient became agitated on the floor.  Wandering behavior, going into the patient's rooms.  Security called number of times which seemed to exacerbate the situation.  Multiple reengagement of psychiatry with no further recommendation and refusal to accept pt to inpatient psych service.  Senior management involved.  Night covering provider transferred pt to ICU night of 12/24 for precedex gtt due to severe agitation and combative behavior.  PCCM was consulted.  Precedex felt to be ineffective and was turned off.  Pt was transferred back to floor on 1/4. Patient remains difficult to redirect, increasingly agitated.    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   Schizophrenia  with acute exacerbation: Continue psychotropics.  Follow-up with psychiatrist.    Acute NSTEMI: Managed conservatively.  Continue aspirin, metoprolol and Lipitor.  Acute metabolic encephalopathy: Continue supportive care  Acute pulmonary edema: Resolved  History of stroke  Diet Order             Diet regular Room service appropriate? Yes; Fluid consistency: Thin  Diet effective now                      Consultants: Psychiatrist Intensivist Cardiologist  Procedures: None    Medications:    aspirin  81 mg Oral Daily   atorvastatin  40 mg Oral Daily   benztropine  0.5 mg Oral BID   clonazePAM  0.5 mg Oral TID   cloZAPine  25 mg Oral QHS   enoxaparin (LOVENOX) injection  40 mg Subcutaneous Q24H   folic acid  1 mg Oral Daily   gabapentin  300 mg Oral TID   haloperidol  5 mg Oral TID   LORazepam  1 mg Oral TID   mouth rinse  15 mL Mouth Rinse BID   metoprolol succinate  25 mg Oral Daily   multivitamin with minerals  1 tablet Oral Daily   [START ON 01/30/2022] paliperidone  234 mg Intramuscular Q28 days   phenobarbital  32.4 mg Oral TID   polyethylene glycol  17 g Oral BID   temazepam  15 mg Oral QHS   thiamine  100 mg Oral Daily   Continuous Infusions:   Anti-infectives (From admission, onward)  None              Family Communication/Anticipated D/C date and plan/Code Status   DVT prophylaxis: enoxaparin (LOVENOX) injection 40 mg Start: 11/23/21 2200 SCDs Start: 11/20/21 2032     Code Status: DNR  Family Communication: None Disposition Plan: Awaiting placement   Status is: Inpatient  Remains inpatient appropriate because: Awaiting legal guardianship and placement         Subjective:   Interval events noted.  Patient was confused, agitated and fighting the staff earlier today.  He was calm at time of my visit.  Objective:    Vitals:   01/03/22 2100 01/04/22 0725 01/04/22 1525 01/04/22 1916  BP: 92/68 117/76 114/80  114/68  Pulse: 83 75 87 86  Resp: 18 (!) 21 18   Temp: (!) 97.5 F (36.4 C)  (!) 97.3 F (36.3 C) (!) 97.5 F (36.4 C)  TempSrc: Oral  Oral Oral  SpO2: 98% 99% 100% 100%  Weight:      Height:       No data found.   Intake/Output Summary (Last 24 hours) at 01/05/2022 1553 Last data filed at 01/05/2022 5462 Gross per 24 hour  Intake 240 ml  Output 600 ml  Net -360 ml    Filed Weights   07/20/21 1635 12/07/21 2025  Weight: 79.4 kg 80 kg    Exam:  GEN: NAD SKIN: Warm and dry EYES: EOMI ENT: MMM CV: RRR PULM: CTA B ABD: soft, ND, NT, +BS CNS: Aphasic, confused EXT: No edema or tenderness          Data Reviewed:   I have personally reviewed following labs and imaging studies:  Labs: Labs show the following:   Basic Metabolic Panel: Recent Labs  Lab 01/04/22 1345  NA 140  K 4.3  CL 105  CO2 29  GLUCOSE 95  BUN 21  CREATININE 1.01  CALCIUM 9.4  MG 2.0  PHOS 3.9   GFR Estimated Creatinine Clearance: 74.3 mL/min (by C-G formula based on SCr of 1.01 mg/dL). Liver Function Tests: Recent Labs  Lab 01/04/22 1345  AST 23  ALT 23  ALKPHOS 129*  BILITOT 0.5  PROT 7.3  ALBUMIN 4.1   No results for input(s): LIPASE, AMYLASE in the last 168 hours. No results for input(s): AMMONIA in the last 168 hours. Coagulation profile No results for input(s): INR, PROTIME in the last 168 hours.  CBC: Recent Labs  Lab 01/04/22 1345  WBC 5.0  NEUTROABS 2.8  HGB 13.7  HCT 40.8  MCV 97.1  PLT 211   Cardiac Enzymes: No results for input(s): CKTOTAL, CKMB, CKMBINDEX, TROPONINI in the last 168 hours. BNP (last 3 results) No results for input(s): PROBNP in the last 8760 hours. CBG: No results for input(s): GLUCAP in the last 168 hours. D-Dimer: No results for input(s): DDIMER in the last 72 hours. Hgb A1c: No results for input(s): HGBA1C in the last 72 hours. Lipid Profile: No results for input(s): CHOL, HDL, LDLCALC, TRIG, CHOLHDL, LDLDIRECT in the  last 72 hours. Thyroid function studies: No results for input(s): TSH, T4TOTAL, T3FREE, THYROIDAB in the last 72 hours.  Invalid input(s): FREET3 Anemia work up: No results for input(s): VITAMINB12, FOLATE, FERRITIN, TIBC, IRON, RETICCTPCT in the last 72 hours. Sepsis Labs: Recent Labs  Lab 01/04/22 1345  WBC 5.0    Microbiology No results found for this or any previous visit (from the past 240 hour(s)).  Procedures and diagnostic studies:  No results found.  LOS: 46 days   Adam [name]  [employer]ered loss adjuster on [email or web address]. If 7PM-7AM, please contact night-coverage at [email or web address]     01/05/2022, 3:53 PM

## 2022-01-05 NOTE — Progress Notes (Signed)
Patient observed in the room on the side of the bed screaming at the NT. This nurse went in to check on patient and patient began to scream and curse. Patient then went into the hallway and began screaming and cursing at staff and visitors on the unit. This nurse administered PRNs as perscribed. Will continue to monitor patient

## 2022-01-05 NOTE — TOC Progression Note (Signed)
Transition of Care [facility]) - Progression Note    Patient Details  Name: Adam [name] MRN: [id number] Date of Birth: 1961-02-12  Transition of [facility]) CM/SW Contact  Margarito Liner, LCSW Phone Number: 01/05/2022, 2:53 PM  Clinical Narrative:  Received voicemail from Ezequiel Ganser, admissions coordinator from [facility]. He stated their sister facility, [facility] still could potentially be an option for patient's discharge. Faxed requested documents to [facility] for review.   Expected Discharge Plan: Group Home Barriers to Discharge: Continued Medical Work up  Expected Discharge Plan and Services Expected Discharge Plan: Group Home In-house Referral: Clinical Social Work     Living arrangements for the past 2 months: Group Home                                       Social Determinants of Health (SDOH) Interventions    Readmission Risk Interventions No flowsheet data found.

## 2022-01-06 DIAGNOSIS — F209 Schizophrenia, unspecified: Secondary | ICD-10-CM | POA: Diagnosis not present

## 2022-01-06 MED ORDER — ALUM & MAG HYDROXIDE-SIMETH 200-200-20 MG/5ML PO SUSP
30.0000 mL | Freq: Four times a day (QID) | ORAL | Status: DC | PRN
  Administered 2022-01-07 – 2022-03-14 (×5): 30 mL via ORAL
  Filled 2022-01-06 (×6): qty 30

## 2022-01-06 NOTE — Progress Notes (Signed)
Progress Note    Adam [name]  [id number] DOB: 06/05/1961  DOA: 07/20/2021 PCP: Pcp, No      Brief Narrative:    Medical records reviewed and are as summarized below:  Adam [name] is a 61 y.o. male with medical history significant for schizophrenia and TBI, who was initially admitted to [facility] ER on July 20, 2021. He has spent more than 123 days in the [facility] ED awaiting psychiatric placement. He was initially brought to [facility] ER due to violent outbursts at his group home. He was IVC'd initially.  Due to difficulty in placing him into facility, pt has essentially been living in the [facility] ER.  He was noted to be lethargic 12/19, found to have NSTEMI.  Pt was admitted to hospitalist service. Cardiology consulted who recommended no intervention.    After admission to the medical service patient became agitated on the floor.  Wandering behavior, going into the patient's rooms.  Security called number of times which seemed to exacerbate the situation.  Multiple reengagement of psychiatry with no further recommendation and refusal to accept pt to inpatient psych service.  Senior management involved.  Night covering provider transferred pt to ICU night of 12/24 for precedex gtt due to severe agitation and combative behavior.  PCCM was consulted.  Precedex felt to be ineffective and was turned off.  Pt was transferred back to floor on 1/4. Patient remains difficult to redirect, increasingly agitated.    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   Schizophrenia  with acute exacerbation: Clozapine was added to antipsychotics on 01/05/2022.  Follow-up with psychiatrist.    Acute NSTEMI: Managed conservatively.  Continue aspirin, metoprolol and Lipitor.  Acute metabolic encephalopathy: Continue supportive care  Acute pulmonary edema: Resolved  History of stroke  Diet Order             Diet regular Room service appropriate? Yes; Fluid consistency: Thin  Diet effective now                      Consultants: Psychiatrist Intensivist Cardiologist  Procedures: None    Medications:    aspirin  81 mg Oral Daily   atorvastatin  40 mg Oral Daily   benztropine  0.5 mg Oral BID   clonazePAM  0.5 mg Oral TID   cloZAPine  25 mg Oral QHS   enoxaparin (LOVENOX) injection  40 mg Subcutaneous [id number]   folic acid  1 mg Oral Daily   gabapentin  300 mg Oral TID   haloperidol  5 mg Oral TID   LORazepam  1 mg Oral TID   mouth rinse  15 mL Mouth Rinse BID   metoprolol succinate  25 mg Oral Daily   multivitamin with minerals  1 tablet Oral Daily   [START ON 01/30/2022] paliperidone  234 mg Intramuscular Q28 days   phenobarbital  32.4 mg Oral TID   polyethylene glycol  17 g Oral BID   temazepam  15 mg Oral QHS   thiamine  100 mg Oral Daily   Continuous Infusions:   Anti-infectives (  From admission, onward)    None              Family Communication/Anticipated D/C date and plan/Code Status   DVT prophylaxis: enoxaparin (LOVENOX) injection 40 mg Start: 11/23/21 2200 SCDs Start: 11/20/21 2032     Code Status: DNR  Family Communication: None Disposition Plan: Awaiting placement   Status is: Inpatient  Remains inpatient appropriate because: Awaiting legal guardianship and placement         Subjective:   Interval events noted.  He is unable to provide any history.  He has a sitter at the bedside  Objective:    Vitals:   01/04/22 1525 01/04/22 1916 01/05/22 1911 01/06/22 0806  BP: 114/80 114/68 117/70 107/63   Pulse: 87 86 94 84  Resp: 18  19 17   Temp: (!) 97.3 F (36.3 C) (!) 97.5 F (36.4 C) 98.1 F (36.7 C) 97.8 F (36.6 C)  TempSrc: Oral Oral  Oral  SpO2: 100% 100% 99% 100%  Weight:      Height:       No data found.   Intake/Output Summary (Last 24 hours) at 01/06/2022 1049 Last data filed at 01/05/2022 2253 Gross per 24 hour  Intake 240 ml  Output --  Net 240 ml    Filed Weights   07/20/21 1635 12/07/21 2025  Weight: 79.4 kg 80 kg    Exam:  GEN: NAD SKIN: No rash EYES: EOMI ENT: MMM CV: RRR PULM: CTA B ABD: soft, ND, NT, +BS CNS: Aphasic EXT: No edema or tenderness PSYCH: Calm, cooperative          Data Reviewed:   I have personally reviewed following labs and imaging studies:  Labs: Labs show the following:   Basic Metabolic Panel: Recent Labs  Lab 01/04/22 1345  NA 140  K 4.3  CL 105  CO2 29  GLUCOSE 95  BUN 21  CREATININE 1.01  CALCIUM 9.4  MG 2.0  PHOS 3.9   GFR Estimated Creatinine Clearance: 74.3 mL/min (by C-G formula based on SCr of 1.01 mg/dL). Liver Function Tests: Recent Labs  Lab 01/04/22 1345  AST 23  ALT 23  ALKPHOS 129*  BILITOT 0.5  PROT 7.3  ALBUMIN 4.1   No results for input(s): LIPASE, AMYLASE in the last 168 hours. No results for input(s): AMMONIA in the last 168 hours. Coagulation profile No results for input(s): INR, PROTIME in the last 168 hours.  CBC: Recent Labs  Lab 01/04/22 1345  WBC 5.0  NEUTROABS 2.8  HGB 13.7  HCT 40.8  MCV 97.1  PLT 211   Cardiac Enzymes: No results for input(s): CKTOTAL, CKMB, CKMBINDEX, TROPONINI in the last 168 hours. BNP (last 3 results) No results for input(s): PROBNP in the last 8760 hours. CBG: No results for input(s): GLUCAP in the last 168 hours. D-Dimer: No results for input(s): DDIMER in the last 72 hours. Hgb A1c: No results for input(s): HGBA1C in the last 72 hours. Lipid Profile: No results for input(s): CHOL, HDL, LDLCALC, TRIG, CHOLHDL, LDLDIRECT  in the last 72 hours. Thyroid function studies: No results for input(s): TSH, T4TOTAL, T3FREE, THYROIDAB in the last 72 hours.  Invalid input(s): FREET3 Anemia work up: No results for input(s): VITAMINB12, FOLATE, FERRITIN, TIBC, IRON, RETICCTPCT in the last 72 hours. Sepsis Labs: Recent Labs  Lab 01/04/22 1345  WBC 5.0    Microbiology No results found for this or any previous visit (from the past 240 hour(s)).  Procedures and diagnostic studies:  No results found.             LOS: 40 days   [place] Copywriter, advertising on [email or web address]. If 7PM-7AM, please contact night-coverage at [email or web address]     01/06/2022, 10:49 AM

## 2022-01-07 DIAGNOSIS — F209 Schizophrenia, unspecified: Secondary | ICD-10-CM | POA: Diagnosis not present

## 2022-01-07 NOTE — Plan of Care (Signed)
Pt agitated and displaying dislike behavior and yelling at sitter at bedside. PRN PO haldol and benadryl given with some decrease in aggressive behavior.  New sitter placed and patient rested well this far into the shift after the night time medication.

## 2022-01-07 NOTE — Progress Notes (Signed)
Patient agitated and yelling, trying to get into clean linen supply room. Staff attempted to redirect to patient's room with no success. PRN haldol and benadryl given as ordered. Anselm Jungling

## 2022-01-07 NOTE — Progress Notes (Signed)
Progress Note    Adam [name]  [id number] DOB: Jan 09, 1961  DOA: 07/20/2021 PCP: Pcp, No      Brief Narrative:    Medical records reviewed and are as summarized below:  Adam [name] is a 61 y.o. male with medical history significant for schizophrenia and TBI, who was initially admitted to [facility] ER on July 20, 2021. He has spent more than 123 days in the [facility] ED awaiting psychiatric placement. He was initially brought to [facility] ER due to violent outbursts at his group home. He was IVC'd initially.  Due to difficulty in placing him into facility, pt has essentially been living in the [facility] ER.  He was noted to be lethargic 12/19, found to have NSTEMI.  Pt was admitted to hospitalist service. Cardiology consulted who recommended no intervention.    After admission to the medical service patient became agitated on the floor.  Wandering behavior, going into the patient's rooms.  Security called number of times which seemed to exacerbate the situation.  Multiple reengagement of psychiatry with no further recommendation and refusal to accept pt to inpatient psych service.  Senior management involved.  Night covering provider transferred pt to ICU night of 12/24 for precedex gtt due to severe agitation and combative behavior.  PCCM was consulted.  Precedex felt to be ineffective and was turned off.  Pt was transferred back to floor on 1/4. Patient remains difficult to redirect, increasingly agitated.    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   Schizophrenia  with acute exacerbation: Continue psychotropics.  Follow-up with psychiatrist.  Acute NSTEMI: Managed conservatively.  Continue aspirin, metoprolol and Lipitor.  Acute metabolic encephalopathy: Continue supportive care  Acute pulmonary edema: Resolved  History of stroke  Diet Order             Diet regular Room service appropriate? Yes; Fluid consistency: Thin  Diet effective now                      Consultants: Psychiatrist Intensivist Cardiologist  Procedures: None    Medications:    aspirin  81 mg Oral Daily   atorvastatin  40 mg Oral Daily   benztropine  0.5 mg Oral BID   clonazePAM  0.5 mg Oral TID   cloZAPine  25 mg Oral QHS   enoxaparin (LOVENOX) injection  40 mg Subcutaneous A999333   folic acid  1 mg Oral Daily   gabapentin  300 mg Oral TID   haloperidol  5 mg Oral TID   LORazepam  1 mg Oral TID   mouth rinse  15 mL Mouth Rinse BID   metoprolol succinate  25 mg Oral Daily   multivitamin with minerals  1 tablet Oral Daily   [START ON 01/30/2022] paliperidone  234 mg Intramuscular Q28 days   phenobarbital  32.4 mg Oral TID   polyethylene glycol  17 g Oral BID   temazepam  15 mg Oral QHS   thiamine  100 mg Oral Daily   Continuous Infusions:   Anti-infectives (From admission, onward)    None  Family Communication/Anticipated D/C date and plan/Code Status   DVT prophylaxis: enoxaparin (LOVENOX) injection 40 mg Start: 11/23/21 2200 SCDs Start: 11/20/21 2032     Code Status: DNR  Family Communication: None Disposition Plan: Awaiting placement   Status is: Inpatient  Remains inpatient appropriate because: Awaiting legal guardianship and placement         Subjective:   Interval events noted.  He has no complaints.  He has a sitter at the bedside   Objective:    Vitals:   01/05/22 1911 01/06/22 0806 01/06/22 1610 01/06/22 1919  BP:  107/63 120/71 114/75  Pulse:  84 89 97  Resp: 19 17 17 19   Temp:  97.8  F (36.6 C) 98 F (36.7 C) 98.1 F (36.7 C)  TempSrc:  Oral Oral Oral  SpO2:  100% 99% 100%  Weight:      Height:       No data found.   Intake/Output Summary (Last 24 hours) at 01/07/2022 1354 Last data filed at 01/06/2022 2115 Gross per 24 hour  Intake 120 ml  Output --  Net 120 ml    Filed Weights   07/20/21 1635 12/07/21 2025  Weight: 79.4 kg 80 kg    Exam:  GEN: NAD SKIN: Warm and dry EYES: EOMI ENT: MMM CV: RRR PULM: CTA B ABD: soft, ND, NT, +BS CNS: Aphasic EXT: No edema or tenderness PSYCH: Restless but cooperative          Data Reviewed:   I have personally reviewed following labs and imaging studies:  Labs: Labs show the following:   Basic Metabolic Panel: Recent Labs  Lab 01/04/22 1345  NA 140  K 4.3  CL 105  CO2 29  GLUCOSE 95  BUN 21  CREATININE 1.01  CALCIUM 9.4  MG 2.0  PHOS 3.9   GFR Estimated Creatinine Clearance: 74.3 mL/min (by C-G formula based on SCr of 1.01 mg/dL). Liver Function Tests: Recent Labs  Lab 01/04/22 1345  AST 23  ALT 23  ALKPHOS 129*  BILITOT 0.5  PROT 7.3  ALBUMIN 4.1   No results for input(s): LIPASE, AMYLASE in the last 168 hours. No results for input(s): AMMONIA in the last 168 hours. Coagulation profile No results for input(s): INR, PROTIME in the last 168 hours.  CBC: Recent Labs  Lab 01/04/22 1345  WBC 5.0  NEUTROABS 2.8  HGB 13.7  HCT 40.8  MCV 97.1  PLT 211   Cardiac Enzymes: No results for input(s): CKTOTAL, CKMB, CKMBINDEX, TROPONINI in the last 168 hours. BNP (last 3 results) No results for input(s): PROBNP in the last 8760 hours. CBG: No results for input(s): GLUCAP in the last 168 hours. D-Dimer: No results for input(s): DDIMER in the last 72 hours. Hgb A1c: No results for input(s): HGBA1C in the last 72 hours. Lipid Profile: No results for input(s): CHOL, HDL, LDLCALC, TRIG, CHOLHDL, LDLDIRECT in the last 72 hours. Thyroid function studies: No results for  input(s): TSH, T4TOTAL, T3FREE, THYROIDAB in the last 72 hours.  Invalid input(s): FREET3 Anemia work up: No results for input(s): VITAMINB12, FOLATE, FERRITIN, TIBC, IRON, RETICCTPCT in the last 72 hours. Sepsis Labs: Recent Labs  Lab 01/04/22 1345  WBC 5.0    Microbiology No results found for this or any previous visit (from the past 240 hour(s)).  Procedures and diagnostic studies:  No results found.             LOS: 48 days   [place] [facility]  Pager on [email or web address]. If 7PM-7AM, please contact night-coverage at [email or web address]     01/07/2022, 1:54 PM

## 2022-01-08 DIAGNOSIS — F209 Schizophrenia, unspecified: Secondary | ICD-10-CM | POA: Diagnosis not present

## 2022-01-08 MED ORDER — CLOZAPINE 25 MG PO TABS
50.0000 mg | ORAL_TABLET | Freq: Every day | ORAL | Status: DC
  Administered 2022-01-08 – 2022-01-09 (×2): 50 mg via ORAL
  Filled 2022-01-08 (×3): qty 2

## 2022-01-08 NOTE — Progress Notes (Signed)
Pt once again yelling out in the hallway, trying to get into the clean supply room. Banging on the counter tops and walls when trying to redirect him. PRN haldol and benadryl given as ordered to help with agitation.

## 2022-01-08 NOTE — Progress Notes (Addendum)
Progress Note    Adam [name]  BLT:[id number] DOB: 03/15/1961  DOA: 07/20/2021 PCP: Pcp, No      Brief Narrative:    Medical records reviewed and are as summarized below:  Adam [name] is a 61 y.o. male with medical history significant for schizophrenia and TBI, who was initially admitted to [facility] ER on July 20, 2021. He has spent more than 123 days in the [facility] ED awaiting psychiatric placement. He was initially brought to [facility] ER due to violent outbursts at his group home. He was IVC'd initially.  Due to difficulty in placing him into facility, pt has essentially been living in the [facility] ER.  He was noted to be lethargic 12/19, found to have NSTEMI.  Pt was admitted to hospitalist service. Cardiology consulted who recommended no intervention.    After admission to the medical service patient became agitated on the floor.  Wandering behavior, going into the patient's rooms.  Security called number of times which seemed to exacerbate the situation.  Multiple reengagement of psychiatry with no further recommendation and refusal to accept pt to inpatient psych service.  Senior management involved.  Night covering provider transferred pt to ICU night of 12/24 for precedex gtt due to severe agitation and combative behavior.  PCCM was consulted.  Precedex felt to be ineffective and was turned off.  Pt was transferred back to floor on 1/4. Patient remains difficult to redirect, increasingly agitated.    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   Schizophrenia  with acute exacerbation: Continue psychotropics follow-up with psychiatrist.  Acute NSTEMI: Managed conservatively.  Continue aspirin, metoprolol and Lipitor.  Acute metabolic encephalopathy: Continue supportive care  Acute pulmonary edema: Resolved  History of stroke  Awaiting legal guardianship and placement  Diet Order             Diet regular Room service appropriate? Yes; Fluid consistency: Thin  Diet effective now                      Consultants: Psychiatrist Intensivist Cardiologist  Procedures: None    Medications:    aspirin  81 mg Oral Daily   atorvastatin  40 mg Oral Daily   benztropine  0.5 mg Oral BID   clonazePAM  0.5 mg Oral TID   cloZAPine  25 mg Oral QHS   enoxaparin (LOVENOX) injection  40 mg Subcutaneous Q24H   folic acid  1 mg Oral Daily   gabapentin  300 mg Oral TID   haloperidol  5 mg Oral TID   LORazepam  1 mg Oral TID   mouth rinse  15 mL Mouth Rinse BID   metoprolol succinate  25 mg Oral Daily   multivitamin with minerals  1 tablet Oral Daily   [START ON 01/30/2022] paliperidone  234 mg Intramuscular Q28 days   phenobarbital  32.4 mg Oral TID   polyethylene glycol  17 g Oral BID   temazepam  15 mg Oral QHS   thiamine  100 mg Oral Daily   Continuous Infusions:   Anti-infectives (From admission,  onward)    None              Family Communication/Anticipated D/C date and plan/Code Status   DVT prophylaxis: enoxaparin (LOVENOX) injection 40 mg Start: 11/23/21 2200 SCDs Start: 11/20/21 2032     Code Status: DNR  Family Communication: None Disposition Plan: Awaiting placement   Status is: Inpatient  Remains inpatient appropriate because: Awaiting legal guardianship and placement         Subjective:   Interval events noted.Marland Kitchen  He has been wandering about and yelling intermittently.  He has a Comptroller at the bedside.    Objective:    Vitals:   01/06/22 1919 01/07/22 1702 01/07/22 2039 01/08/22  0527  BP:   101/73 113/84  Pulse:  84 77 70  Resp: 19 20 16 16   Temp:  98.2 F (36.8 C) 98.3 F (36.8 C) 97.6 F (36.4 C)  TempSrc: Oral Oral Oral   SpO2:  98% 100% 98%  Weight:      Height:       No data found.   Intake/Output Summary (Last 24 hours) at 01/08/2022 1318 Last data filed at 01/08/2022 1030 Gross per 24 hour  Intake 720 ml  Output --  Net 720 ml    Filed Weights   07/20/21 1635 12/07/21 2025  Weight: 79.4 kg 80 kg    Exam:  GEN: NAD SKIN: Warm and dry EYES: No pallor or icterus ENT: MMM CV: RRR PULM: CTA B ABD: soft, ND, NT, +BS CNS: Aphasic and difficult to understand EXT: No edema or tenderness PSYCH: Restless and anxious        Data Reviewed:   I have personally reviewed following labs and imaging studies:  Labs: Labs show the following:   Basic Metabolic Panel: Recent Labs  Lab 01/04/22 1345  NA 140  K 4.3  CL 105  CO2 29  GLUCOSE 95  BUN 21  CREATININE 1.01  CALCIUM 9.4  MG 2.0  PHOS 3.9   GFR Estimated Creatinine Clearance: 74.3 mL/min (by C-G formula based on SCr of 1.01 mg/dL). Liver Function Tests: Recent Labs  Lab 01/04/22 1345  AST 23  ALT 23  ALKPHOS 129*  BILITOT 0.5  PROT 7.3  ALBUMIN 4.1   No results for input(s): LIPASE, AMYLASE in the last 168 hours. No results for input(s): AMMONIA in the last 168 hours. Coagulation profile No results for input(s): INR, PROTIME in the last 168 hours.  CBC: Recent Labs  Lab 01/04/22 1345  WBC 5.0  NEUTROABS 2.8  HGB 13.7  HCT 40.8  MCV 97.1  PLT 211   Cardiac Enzymes: No results for input(s): CKTOTAL, CKMB, CKMBINDEX, TROPONINI in the last 168 hours. BNP (last 3 results) No results for input(s): PROBNP in the last 8760 hours. CBG: No results for input(s): GLUCAP in the last 168 hours. D-Dimer: No results for input(s): DDIMER in the last 72 hours. Hgb A1c: No results for input(s): HGBA1C in the last 72 hours. Lipid Profile: No results for input(s):  CHOL, HDL, LDLCALC, TRIG, CHOLHDL, LDLDIRECT in the last 72 hours. Thyroid function studies: No results for input(s): TSH, T4TOTAL, T3FREE, THYROIDAB in the last 72 hours.  Invalid input(s): FREET3 Anemia work up: No results for input(s): VITAMINB12, FOLATE, FERRITIN, TIBC, IRON, RETICCTPCT in the last 72 hours. Sepsis Labs: Recent Labs  Lab 01/04/22 1345  WBC 5.0    Microbiology No results found for this or any previous visit (from the past 240 hour(s)).  Procedures and  diagnostic studies:  No results found.             LOS: 49 days   Jadis Mika  [employer] Chartered loss adjuster on [email or web address]. If 7PM-7AM, please contact night-coverage at [email or web address]     01/08/2022, 1:18 PM

## 2022-01-09 DIAGNOSIS — F209 Schizophrenia, unspecified: Secondary | ICD-10-CM | POA: Diagnosis not present

## 2022-01-09 NOTE — TOC Progression Note (Addendum)
Transition of Care [name]) - Progression Note    Patient Details  Name: Ronzell Cannizzaro MRN: [id number] Date of Birth: [birth year]  Transition of Care [facility]) CM/SW [place], LCSW Phone Number: 01/09/2022, 9:56 AM  Clinical Narrative:   Spoke with DSS social worker who asked about [facility] referral. Explained that psychiatry note states he does not meet criteria for inpatient psych admission. DSS has not complete guardianship paperwork yet due to other emergencies that occurred since last conversation. She is hoping to have it completed by the end of the week. Left voicemail for Wynona Neat with [facility] to follow up on paperwork that was faxed over on Friday.  12:03 pm: Received voicemail from [place] stating [facility] is going to review referral on 2/14. Left him a voicemail to see if they would need any updated clinicals prior to review and what day those need to be sent.  12:51 pm: Received call back from [facility]. [facility] will review referral on 2/16. Will fax updated clinicals on 2/14. Updated mom.  Expected Discharge Plan: Group Home Barriers to Discharge: Continued Medical Work up  Expected Discharge Plan and Services Expected Discharge Plan: Group Home In-house Referral: Clinical Social Work     Living arrangements for the past 2 months: Group Home                                       Social Determinants of Health (SDOH) Interventions    Readmission Risk Interventions No flowsheet data found.

## 2022-01-09 NOTE — Progress Notes (Signed)
Progress Note    Adam [name]  [id number] DOB: 02-[name]-1961  DOA: 07/20/2021 PCP: Pcp, No      Brief Narrative:    Medical records reviewed and are as summarized below:  Adam [name] is a 61 y.o. male with medical history significant for schizophrenia and TBI, who was initially admitted to [facility] ER on July 20, 2021. He has spent more than 123 days in the [facility] ED awaiting psychiatric placement. He was initially brought to [facility] ER due to violent outbursts at his group home. He was IVC'd initially.  Due to difficulty in placing him into facility, pt has essentially been living in the [facility] ER.  He was noted to be lethargic 12/19, found to have NSTEMI.  Pt was admitted to hospitalist service. Cardiology consulted who recommended no intervention.    After admission to the medical service patient became agitated on the floor.  Wandering behavior, going into the patient's rooms.  Security called number of times which seemed to exacerbate the situation.  Multiple reengagement of psychiatry with no further recommendation and refusal to accept pt to inpatient psych service.  Senior management involved.  Night covering provider transferred pt to ICU night of 12/24 for precedex gtt due to severe agitation and combative behavior.  PCCM was consulted.  Precedex felt to be ineffective and was turned off.  Pt was transferred back to floor on 1/4. Patient remains difficult to redirect, increasingly agitated.    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   Schizophrenia  with acute exacerbation: Continue antipsychotics and follow-up with psychiatrist.  Acute NSTEMI: Managed conservatively.  Continue aspirin, metoprolol and Lipitor.  Acute metabolic encephalopathy: Continue supportive care  Acute pulmonary edema: Resolved  History of stroke  Awaiting legal guardianship and placement  Diet Order             Diet regular Room service appropriate? Yes; Fluid consistency: Thin  Diet effective now                      Consultants: Psychiatrist Intensivist Cardiologist  Procedures: None    Medications:    aspirin  81 mg Oral Daily   atorvastatin  40 mg Oral Daily   benztropine  0.5 mg Oral BID   clonazePAM  0.5 mg Oral TID   cloZAPine  50 mg Oral QHS   enoxaparin (LOVENOX) injection  40 mg Subcutaneous [id number]   folic acid  1 mg Oral Daily   gabapentin  300 mg Oral TID   haloperidol  5 mg Oral TID   LORazepam  1 mg Oral TID   mouth rinse  15 mL Mouth Rinse BID   metoprolol succinate  25 mg Oral Daily   multivitamin with minerals  1 tablet Oral Daily   [START ON 01/30/2022] paliperidone  234 mg Intramuscular Q28 days   phenobarbital  32.4 mg Oral TID   polyethylene glycol  17 g Oral BID   temazepam  15 mg Oral QHS   thiamine  100 mg Oral Daily   Continuous Infusions:   Anti-infectives (From  admission, onward)    None              Family Communication/Anticipated D/C date and plan/Code Status   DVT prophylaxis: enoxaparin (LOVENOX) injection 40 mg Start: 11/23/21 2200 SCDs Start: 11/20/21 2032     Code Status: DNR  Family Communication: None Disposition Plan: Awaiting placement   Status is: Inpatient  Remains inpatient appropriate because: Awaiting legal guardianship and placement         Subjective:   Interval events noted.Marland Kitchen  He has been wandering about and yelling intermittently.  He has a Actuary at the bedside.    Objective:    Vitals:   01/07/22 2039 01/08/22 0527 01/08/22 1613  01/08/22 1915  BP: 101/73 113/84 117/74 (!) 128/117  Pulse: 77 70 87 95  Resp: 16 16 18 20   Temp: 98.3 F (36.8 C) 97.6 F (36.4 C) 97.8 F (36.6 C) 97.7 F (36.5 C)  TempSrc: Oral  Oral   SpO2: 100% 98% 96% 97%  Weight:      Height:       No data found.   Intake/Output Summary (Last 24 hours) at 01/09/2022 1355 Last data filed at 01/09/2022 1041 Gross per 24 hour  Intake 240 ml  Output 2 ml  Net 238 ml    Filed Weights   07/20/21 1635 12/07/21 2025  Weight: 79.4 kg 80 kg    Exam:  GEN: NAD SKIN: Warm and dry EYES: Anicteric ENT: MMM CV: RRR PULM: CTA B ABD: soft, ND, NT, +BS CNS: AAO x 3, non focal EXT: No edema or tenderness PSYCH: Restless, difficult to redirect      Data Reviewed:   I have personally reviewed following labs and imaging studies:  Labs: Labs show the following:   Basic Metabolic Panel: Recent Labs  Lab 01/04/22 1345  NA 140  K 4.3  CL 105  CO2 29  GLUCOSE 95  BUN 21  CREATININE 1.01  CALCIUM 9.4  MG 2.0  PHOS 3.9   GFR Estimated Creatinine Clearance: 74.3 mL/min (by C-G formula based on SCr of 1.01 mg/dL). Liver Function Tests: Recent Labs  Lab 01/04/22 1345  AST 23  ALT 23  ALKPHOS 129*  BILITOT 0.5  PROT 7.3  ALBUMIN 4.1   No results for input(s): LIPASE, AMYLASE in the last 168 hours. No results for input(s): AMMONIA in the last 168 hours. Coagulation profile No results for input(s): INR, PROTIME in the last 168 hours.  CBC: Recent Labs  Lab 01/04/22 1345  WBC 5.0  NEUTROABS 2.8  HGB 13.7  HCT 40.8  MCV 97.1  PLT 211   Cardiac Enzymes: No results for input(s): CKTOTAL, CKMB, CKMBINDEX, TROPONINI in the last 168 hours. BNP (last 3 results) No results for input(s): PROBNP in the last 8760 hours. CBG: No results for input(s): GLUCAP in the last 168 hours. D-Dimer: No results for input(s): DDIMER in the last 72 hours. Hgb A1c: No results for input(s): HGBA1C in the last 72 hours. Lipid  Profile: No results for input(s): CHOL, HDL, LDLCALC, TRIG, CHOLHDL, LDLDIRECT in the last 72 hours. Thyroid function studies: No results for input(s): TSH, T4TOTAL, T3FREE, THYROIDAB in the last 72 hours.  Invalid input(s): FREET3 Anemia work up: No results for input(s): VITAMINB12, FOLATE, FERRITIN, TIBC, IRON, RETICCTPCT in the last 72 hours. Sepsis Labs: Recent Labs  Lab 01/04/22 1345  WBC 5.0    Microbiology No results found for this or any previous visit (from the past 240 hour(s)).  Procedures and diagnostic studies:  No results found.             LOS: 50 days   [place] Copywriter, advertising on [email or web address]. If 7PM-7AM, please contact night-coverage at [email or web address]     01/09/2022, 1:55 PM

## 2022-01-09 NOTE — Progress Notes (Signed)
Pt woke up displaying aggressive behavior towards sitter, banging on the furniture and walls and verbally loud towards staff. RN attempted to redirect patient by distraction and walking with patient in the hallway with no improvement. PRN haldol and Benadryl administered per order.

## 2022-01-10 DIAGNOSIS — F209 Schizophrenia, unspecified: Secondary | ICD-10-CM | POA: Diagnosis not present

## 2022-01-10 MED ORDER — CLOZAPINE 25 MG PO TABS
75.0000 mg | ORAL_TABLET | Freq: Every day | ORAL | Status: DC
  Administered 2022-01-10 – 2022-01-11 (×2): 75 mg via ORAL
  Filled 2022-01-10 (×4): qty 3
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
## 2022-01-10 NOTE — Progress Notes (Signed)
Patient ID: Adam [name], male   DOB: 05/13/1961, 61 y.o.   MRN: [id number] Follow-up brief psychiatric consultation for this 61 year old man with schizophrenia who remains on the medical floor awaiting placement.  Patient had been started on clozapine a couple days ago.  So far up to 50 mg at night.  Patient was awake and alert when I saw him today.  Did not seem to be significantly different than usual.  He was not aggressive or hostile but still indicating some kind of belief that there is something in his abdomen.  Still not able to answer questions very reliably.  Does not appear to be having any significant side effects related to starting a new medicine.  I advised him that we would be continuing to gently increase the medicine.  Orders placed to increase clozapine dose to 75 mg at night

## 2022-01-10 NOTE — Progress Notes (Signed)
Mobility Specialist - Progress Note   01/10/22 1100  Mobility  Activity Ambulated independently in hallway;Ambulated independently in room;Ambulated independently to bathroom;Stood at bedside;Dangled on edge of bed  Range of Motion/Exercises Active  Level of Assistance Standby assist, set-up cues, supervision of patient - no hands on  Assistive Device None  Distance Ambulated (ft) 350 ft  Activity Response Tolerated fair  Transport method Ambulatory  $Mobility charge 1 Mobility     Pt a little agitated on arrival. Was able to participate in changing his bed linen and seated bathing. Ambulated hallway with supervision. Pt left EOB with sitter present.    Filiberto Pinks Mobility Specialist 01/10/22, 1:07 PM

## 2022-01-11 DIAGNOSIS — F209 Schizophrenia, unspecified: Secondary | ICD-10-CM | POA: Diagnosis not present
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
## 2022-01-11 NOTE — Progress Notes (Signed)
Patient observed with increase agitation, screaming at times. Several attempts to redirect patient to his room so he can take his  scheduled medications until he finally agreed to take them.  Patient repeatedly saying  "[place]"  he spoke with mom over the phone. Patient appeared less agitated after the call

## 2022-01-12 DIAGNOSIS — F209 Schizophrenia, unspecified: Secondary | ICD-10-CM | POA: Diagnosis not present

## 2022-01-12 MED ORDER — CLOZAPINE 100 MG PO TABS
100.0000 mg | ORAL_TABLET | Freq: Every day | ORAL | Status: DC
  Administered 2022-01-12 – 2022-01-14 (×3): 100 mg via ORAL
  Filled 2022-01-12 (×4): qty 1

## 2022-01-12 MED ORDER — ZIPRASIDONE MESYLATE 20 MG IM SOLR
10.0000 mg | Freq: Once | INTRAMUSCULAR | Status: AC
  Administered 2022-01-13: 10 mg via INTRAMUSCULAR
  Filled 2022-01-12: qty 20
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
## 2022-01-12 NOTE — TOC Progression Note (Signed)
Transition of Care [facility]) - Progression Note    Patient Details  Name: Adam [name] MRN: [id number] Date of Birth: 11-Oct-1961  Transition of [facility]) CM/SW Contact  Margarito Liner, LCSW Phone Number: 01/12/2022, 11:46 AM  Clinical Narrative: Received voicemail from Nancy Fetter, social worker at [facility]. Request to pursue guardianship has been denied. Patient's mother will continue to be his decision-maker.    Expected Discharge Plan: Group Home Barriers to Discharge: Continued Medical Work up  Expected Discharge Plan and Services Expected Discharge Plan: Group Home In-house Referral: Clinical Social Work     Living arrangements for the past 2 months: Group Home                                       Social Determinants of Health (SDOH) Interventions    Readmission Risk Interventions No flowsheet data found.
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
## 2022-01-13 DIAGNOSIS — F209 Schizophrenia, unspecified: Secondary | ICD-10-CM | POA: Diagnosis not present

## 2022-01-13 MED ORDER — SIMETHICONE 80 MG PO CHEW
80.0000 mg | CHEWABLE_TABLET | Freq: Four times a day (QID) | ORAL | Status: DC | PRN
  Administered 2022-01-13: 80 mg via ORAL
  Filled 2022-01-13: qty 1

## 2022-01-13 MED ORDER — LOPERAMIDE HCL 2 MG PO CAPS
2.0000 mg | ORAL_CAPSULE | ORAL | Status: DC | PRN
  Administered 2022-01-13: 2 mg via ORAL
  Filled 2022-01-13: qty 1
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
## 2022-01-14 DIAGNOSIS — F209 Schizophrenia, unspecified: Secondary | ICD-10-CM | POA: Diagnosis not present

## 2022-01-14 LAB — CBC WITH DIFFERENTIAL/PLATELET
Abs Immature Granulocytes: 0.04 10*3/uL (ref 0.00–0.07)
Basophils Absolute: 0.1 10*3/uL (ref 0.0–0.1)
Basophils Relative: 1 %
Eosinophils Absolute: 0.2 10*3/uL (ref 0.0–0.5)
Eosinophils Relative: 2 %
HCT: 39.4 % (ref 39.0–52.0)
Hemoglobin: 13.4 g/dL (ref 13.0–17.0)
Immature Granulocytes: 1 %
Lymphocytes Relative: 16 %
Lymphs Abs: 1.3 10*3/uL (ref 0.7–4.0)
MCH: 32.4 pg (ref 26.0–34.0)
MCHC: 34 g/dL (ref 30.0–36.0)
MCV: 95.4 fL (ref 80.0–100.0)
Monocytes Absolute: 0.5 10*3/uL (ref 0.1–1.0)
Monocytes Relative: 6 %
Neutro Abs: 5.7 10*3/uL (ref 1.7–7.7)
Neutrophils Relative %: 74 %
Platelets: 172 10*3/uL (ref 150–400)
RBC: 4.13 MIL/uL — ABNORMAL LOW (ref 4.22–5.81)
RDW: 14.4 % (ref 11.5–15.5)
WBC: 7.7 10*3/uL (ref 4.0–10.5)
nRBC: 0 % (ref 0.0–0.2)
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
## 2022-01-14 NOTE — Progress Notes (Signed)
Pharmacy Consult - Clozapine     61 yo male ordered clozapine 100 mg PO QHS  This patient's order has been reviewed for prescribing contraindications.    Clozapine REMS enrollment Verified: yes on 01/04/22  REMS patient ID: [id number] Current Outpatient Monitoring: Monthly    Home Regimen:  New Start   Dose Adjustments This Admission: 2/3 started on clozapine 25mg   2/10 clozapine 100mg     Labs: Date: 01/04/22    ANC: 2800   Submitted: 01/04/22 Date: 01/14/22 ANC: 2700   Submitted: 01/14/22      Plan: Patient is approved to receive clozapine with weekly monitoring  Dispense authorization obtained from REMs online 2/2  Continue  clozapine 100mg  QHS Monitor ANC at least weekly while inpatient    [id number], PharmD, BCPS Clinical Pharmacist 01/14/2022 11:40 AM
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
## 2022-01-15 DIAGNOSIS — R509 Fever, unspecified: Secondary | ICD-10-CM | POA: Diagnosis not present

## 2022-01-15 DIAGNOSIS — R748 Abnormal levels of other serum enzymes: Secondary | ICD-10-CM | POA: Clinically undetermined

## 2022-01-15 DIAGNOSIS — F209 Schizophrenia, unspecified: Secondary | ICD-10-CM | POA: Diagnosis not present

## 2022-01-15 DIAGNOSIS — G3189 Other specified degenerative diseases of nervous system: Secondary | ICD-10-CM | POA: Diagnosis not present

## 2022-01-15 LAB — CBC WITH DIFFERENTIAL/PLATELET
Abs Immature Granulocytes: 0.03 10*3/uL (ref 0.00–0.07)
Basophils Absolute: 0 10*3/uL (ref 0.0–0.1)
Basophils Relative: 1 %
Eosinophils Absolute: 0.1 10*3/uL (ref 0.0–0.5)
Eosinophils Relative: 1 %
HCT: 39.2 % (ref 39.0–52.0)
Hemoglobin: 13.6 g/dL (ref 13.0–17.0)
Immature Granulocytes: 0 %
Lymphocytes Relative: 13 %
Lymphs Abs: 1 10*3/uL (ref 0.7–4.0)
MCH: 32.6 pg (ref 26.0–34.0)
MCHC: 34.7 g/dL (ref 30.0–36.0)
MCV: 94 fL (ref 80.0–100.0)
Monocytes Absolute: 0.5 10*3/uL (ref 0.1–1.0)
Monocytes Relative: 6 %
Neutro Abs: 6.4 10*3/uL (ref 1.7–7.7)
Neutrophils Relative %: 79 %
Platelets: 166 10*3/uL (ref 150–400)
RBC: 4.17 MIL/uL — ABNORMAL LOW (ref 4.22–5.81)
RDW: 14.3 % (ref 11.5–15.5)
WBC: 8.1 10*3/uL (ref 4.0–10.5)
nRBC: 0 % (ref 0.0–0.2)

## 2022-01-15 LAB — COMPREHENSIVE METABOLIC PANEL
ALT: 29 U/L (ref 0–44)
AST: 24 U/L (ref 15–41)
Albumin: 3.9 g/dL (ref 3.5–5.0)
Alkaline Phosphatase: 115 U/L (ref 38–126)
Anion gap: 8 (ref 5–15)
BUN: 18 mg/dL (ref 8–23)
CO2: 23 mmol/L (ref 22–32)
Calcium: 9 mg/dL (ref 8.9–10.3)
Chloride: 104 mmol/L (ref 98–111)
Creatinine, Ser: 0.93 mg/dL (ref 0.61–1.24)
GFR, Estimated: >60 mL/min (ref >60)
Glucose, Bld: 105 mg/dL — ABNORMAL HIGH (ref 70–99)
Potassium: 4.3 mmol/L (ref 3.5–5.1)
Sodium: 135 mmol/L (ref 135–145)
Total Bilirubin: 0.8 mg/dL (ref 0.3–1.2)
Total Protein: 6.7 g/dL (ref 6.5–8.1)

## 2022-01-15 LAB — URINALYSIS, ROUTINE W REFLEX MICROSCOPIC
Bilirubin Urine: NEGATIVE
Glucose, UA: NEGATIVE mg/dL
Hgb urine dipstick: NEGATIVE
Ketones, ur: NEGATIVE mg/dL
Leukocytes,Ua: NEGATIVE
Nitrite: NEGATIVE
Protein, ur: NEGATIVE mg/dL
Specific Gravity, Urine: 1.017 (ref 1.005–1.030)
pH: 6 (ref 5.0–8.0)

## 2022-01-15 LAB — LACTATE DEHYDROGENASE: LDH: 187 U/L (ref 98–192)

## 2022-01-15 LAB — CK: Total CK: 518 U/L — ABNORMAL HIGH (ref 49–397)

## 2022-01-15 MED ORDER — CLOZAPINE 25 MG PO TABS
125.0000 mg | ORAL_TABLET | Freq: Every day | ORAL | Status: DC
  Administered 2022-01-15 – 2022-01-16 (×2): 125 mg via ORAL
  Filled 2022-01-15 (×3): qty 1

## 2022-01-15 NOTE — Assessment & Plan Note (Addendum)
Elevated CK most likely due to IM injections as per psych, less likely NMS.  ?- No changes in the current regimen. ? ?

## 2022-01-15 NOTE — Assessment & Plan Note (Deleted)
develop
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
## 2022-01-16 DIAGNOSIS — F209 Schizophrenia, unspecified: Secondary | ICD-10-CM | POA: Diagnosis not present

## 2022-01-16 LAB — CBC
HCT: 36.7 % — ABNORMAL LOW (ref 39.0–52.0)
Hemoglobin: 12.4 g/dL — ABNORMAL LOW (ref 13.0–17.0)
MCH: 31.8 pg (ref 26.0–34.0)
MCHC: 33.8 g/dL (ref 30.0–36.0)
MCV: 94.1 fL (ref 80.0–100.0)
Platelets: 169 10*3/uL (ref 150–400)
RBC: 3.9 MIL/uL — ABNORMAL LOW (ref 4.22–5.81)
RDW: 14.1 % (ref 11.5–15.5)
WBC: 6.4 10*3/uL (ref 4.0–10.5)
nRBC: 0 % (ref 0.0–0.2)

## 2022-01-16 LAB — CK: Total CK: 1398 U/L — ABNORMAL HIGH (ref 49–397)
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
## 2022-01-16 NOTE — TOC Progression Note (Addendum)
Transition of Care [facility]) - Progression Note    Patient Details  Name: Adam [name] MRN: [id number] Date of Birth: 07/02/1961  Transition of Care [facility]) CM/SW Contact  Margarito Liner, LCSW Phone Number: 01/16/2022, 10:24 AM  Clinical Narrative:  Faxed last 3 days of progress notes to Ezequiel Ganser with [facility].   12:22 pm: Spoke to patient's mother about plan if [facility] is unable to accept him. She said he cannot return home with her. She lives in a senior community and is almost 62 years old. Discussed potential for another group home placement which she declined because they cannot meet his needs. She stated it's because of his heart attack.   Expected Discharge Plan: Group Home Barriers to Discharge: Continued Medical Work up  Expected Discharge Plan and Services Expected Discharge Plan: Group Home In-house Referral: Clinical Social Work     Living arrangements for the past 2 months: Group Home                                       Social Determinants of Health (SDOH) Interventions    Readmission Risk Interventions No flowsheet data found.

## 2022-01-16 NOTE — Progress Notes (Signed)
Mobility Specialist - Progress Note   01/16/22 1100  Mobility  Activity Ambulated independently in hallway  Level of Assistance Standby assist, set-up cues, supervision of patient - no hands on  Assistive Device None  Distance Ambulated (ft) 160 ft  Activity Response Tolerated well  $Mobility charge 1 Mobility     Pt called out to mobility tech to ambulate hallway with him. Pt left in room with sitter present.    Filiberto Pinks Mobility Specialist 01/16/22, 11:59 AM

## 2022-01-17 DIAGNOSIS — F209 Schizophrenia, unspecified: Secondary | ICD-10-CM | POA: Diagnosis not present

## 2022-01-17 LAB — BASIC METABOLIC PANEL
Anion gap: 5 (ref 5–15)
BUN: 9 mg/dL (ref 8–23)
CO2: 24 mmol/L (ref 22–32)
Calcium: 9.1 mg/dL (ref 8.9–10.3)
Chloride: 108 mmol/L (ref 98–111)
Creatinine, Ser: 0.94 mg/dL (ref 0.61–1.24)
GFR, Estimated: >60 mL/min (ref >60)
Glucose, Bld: 110 mg/dL — ABNORMAL HIGH (ref 70–99)
Potassium: 4.2 mmol/L (ref 3.5–5.1)
Sodium: 137 mmol/L (ref 135–145)

## 2022-01-17 MED ORDER — SODIUM CHLORIDE 0.9 % IV SOLN: INTRAVENOUS | Status: DC

## 2022-01-17 MED ORDER — BISACODYL 5 MG PO TBEC
10.0000 mg | DELAYED_RELEASE_TABLET | Freq: Every day | ORAL | Status: DC | PRN
  Administered 2022-03-04 – 2022-03-12 (×2): 10 mg via ORAL
  Filled 2022-01-17 (×2): qty 2

## 2022-01-17 MED ORDER — DOCUSATE SODIUM 100 MG PO CAPS
100.0000 mg | ORAL_CAPSULE | Freq: Two times a day (BID) | ORAL | Status: DC
  Administered 2022-01-17 – 2022-02-18 (×50): 100 mg via ORAL
  Filled 2022-01-17 (×63): qty 1

## 2022-01-17 MED ORDER — BISACODYL 10 MG RE SUPP: 10.0000 mg | Freq: Every day | RECTAL | Status: DC | PRN

## 2022-01-17 MED ORDER — CLOZAPINE 25 MG PO TABS
150.0000 mg | ORAL_TABLET | Freq: Every day | ORAL | Status: DC
  Administered 2022-01-17 – 2022-01-21 (×5): 150 mg via ORAL
  Filled 2022-01-17 (×6): qty 2
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
## 2022-01-18 DIAGNOSIS — F209 Schizophrenia, unspecified: Secondary | ICD-10-CM | POA: Diagnosis not present

## 2022-01-18 LAB — CBC
HCT: 37.8 % — ABNORMAL LOW (ref 39.0–52.0)
Hemoglobin: 12.6 g/dL — ABNORMAL LOW (ref 13.0–17.0)
MCH: 31.7 pg (ref 26.0–34.0)
MCHC: 33.3 g/dL (ref 30.0–36.0)
MCV: 95.2 fL (ref 80.0–100.0)
Platelets: 187 10*3/uL (ref 150–400)
RBC: 3.97 MIL/uL — ABNORMAL LOW (ref 4.22–5.81)
RDW: 14 % (ref 11.5–15.5)
WBC: 4.6 10*3/uL (ref 4.0–10.5)
nRBC: 0 % (ref 0.0–0.2)

## 2022-01-18 LAB — BASIC METABOLIC PANEL
Anion gap: 5 (ref 5–15)
BUN: 25 mg/dL — ABNORMAL HIGH (ref 8–23)
CO2: 24 mmol/L (ref 22–32)
Calcium: 8.7 mg/dL — ABNORMAL LOW (ref 8.9–10.3)
Chloride: 108 mmol/L (ref 98–111)
Creatinine, Ser: 0.79 mg/dL (ref 0.61–1.24)
GFR, Estimated: >60 mL/min (ref >60)
Glucose, Bld: 112 mg/dL — ABNORMAL HIGH (ref 70–99)
Potassium: 4.2 mmol/L (ref 3.5–5.1)
Sodium: 137 mmol/L (ref 135–145)

## 2022-01-18 LAB — MAGNESIUM: Magnesium: 2 mg/dL (ref 1.7–2.4)

## 2022-01-18 LAB — CK: Total CK: 576 U/L — ABNORMAL HIGH (ref 49–397)

## 2022-01-18 LAB — PHOSPHORUS: Phosphorus: 3.9 mg/dL (ref 2.5–4.6)

## 2022-01-18 MED ORDER — ZIPRASIDONE MESYLATE 20 MG IM SOLR
20.0000 mg | Freq: Once | INTRAMUSCULAR | Status: AC
  Administered 2022-01-18: 20 mg via INTRAMUSCULAR
  Filled 2022-01-18: qty 20
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
## 2022-01-18 NOTE — Progress Notes (Signed)
Patient is noted to be in the hall yelling "no way". When staff attempts to calm the patient, he becomes more agitated and made attempts to come behind the nursing station. Due to multiple failed attempt to redirect the patient,  PRN haldol IM is administered.

## 2022-01-18 NOTE — Progress Notes (Signed)
Mobility Specialist - Progress Note   01/18/22 1100  Mobility  Activity Ambulated independently in hallway  Level of Assistance Standby assist, set-up cues, supervision of patient - no hands on  Assistive Device None  Distance Ambulated (ft) 500 ft  Activity Response Tolerated well  Transport method Ambulatory  $Mobility charge 1 Mobility     Pt ambulated in hallway. Easily redirectable at this time, but does get distracted and expresses frustration about water fountain not working. Not aggressive, but occasional yelling. Pt left in room with sitter present.    Filiberto Pinks Mobility Specialist 01/18/22, 11:49 AM
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
## 2022-01-18 NOTE — TOC Progression Note (Signed)
Transition of Care [facility]) - Progression Note    Patient Details  Name: Adam [name] MRN: [id number] Date of Birth: 08/02/1961  Transition of Care [facility]) CM/SW Contact  Margarito Liner, LCSW Phone Number: 01/18/2022, 8:22 AM  Clinical Narrative:   Received voicemail from Adam [name] at [facility] stating there is another neuromedical treatment center in the state: [facility] in [place]. Unfortunately they have no intention of accepting patient in the event [facility] cannot offer him a bed.  Expected Discharge Plan: Group Home Barriers to Discharge: Continued Medical Work up  Expected Discharge Plan and Services Expected Discharge Plan: Group Home In-house Referral: Clinical Social Work     Living arrangements for the past 2 months: Group Home                                       Social Determinants of Health (SDOH) Interventions    Readmission Risk Interventions No flowsheet data found.

## 2022-01-19 DIAGNOSIS — F209 Schizophrenia, unspecified: Secondary | ICD-10-CM | POA: Diagnosis not present

## 2022-01-19 LAB — CBC
HCT: 40 % (ref 39.0–52.0)
Hemoglobin: 13.4 g/dL (ref 13.0–17.0)
MCH: 31.8 pg (ref 26.0–34.0)
MCHC: 33.5 g/dL (ref 30.0–36.0)
MCV: 94.8 fL (ref 80.0–100.0)
Platelets: 195 10*3/uL (ref 150–400)
RBC: 4.22 MIL/uL (ref 4.22–5.81)
RDW: 14 % (ref 11.5–15.5)
WBC: 4.8 10*3/uL (ref 4.0–10.5)
nRBC: 0 % (ref 0.0–0.2)

## 2022-01-19 LAB — BASIC METABOLIC PANEL
Anion gap: 7 (ref 5–15)
BUN: 20 mg/dL (ref 8–23)
CO2: 25 mmol/L (ref 22–32)
Calcium: 9 mg/dL (ref 8.9–10.3)
Chloride: 106 mmol/L (ref 98–111)
Creatinine, Ser: 0.97 mg/dL (ref 0.61–1.24)
GFR, Estimated: >60 mL/min (ref >60)
Glucose, Bld: 132 mg/dL — ABNORMAL HIGH (ref 70–99)
Potassium: 3.6 mmol/L (ref 3.5–5.1)
Sodium: 138 mmol/L (ref 135–145)

## 2022-01-19 LAB — PHOSPHORUS: Phosphorus: 4.4 mg/dL (ref 2.5–4.6)

## 2022-01-19 LAB — MAGNESIUM: Magnesium: 1.8 mg/dL (ref 1.7–2.4)

## 2022-01-19 LAB — CK: Total CK: 474 U/L — ABNORMAL HIGH (ref 49–397)

## 2022-01-19 MED ORDER — ZIPRASIDONE MESYLATE 20 MG IM SOLR
20.0000 mg | Freq: Once | INTRAMUSCULAR | Status: AC
  Administered 2022-01-19: 20 mg via INTRAMUSCULAR
  Filled 2022-01-19: qty 20
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
## 2022-01-19 NOTE — TOC Progression Note (Addendum)
Transition of Care [facility]) - Progression Note    Patient Details  Name: Adam [name] MRN: [id number] Date of Birth: 19-Jul-1961  Transition [facility]) CM/SW Contact  Margarito Liner, LCSW Phone Number: 01/19/2022, 8:44 AM  Clinical Narrative:   Ezequiel Ganser has not received decision from [facility] yet but will reach out to them. He will update CSW and patient's mother once he gets a response.  3:46 pm: Left Mark a voicemail to see if there were any updates.  Expected Discharge Plan: Group Home Barriers to Discharge: Continued Medical Work up  Expected Discharge Plan and Services Expected Discharge Plan: Group Home In-house Referral: Clinical Social Work     Living arrangements for the past 2 months: Group Home                                       Social Determinants of Health (SDOH) Interventions    Readmission Risk Interventions No flowsheet data found.

## 2022-01-20 DIAGNOSIS — F209 Schizophrenia, unspecified: Secondary | ICD-10-CM | POA: Diagnosis not present

## 2022-01-20 MED ORDER — LORAZEPAM 2 MG PO TABS
2.0000 mg | ORAL_TABLET | Freq: Four times a day (QID) | ORAL | Status: DC | PRN
  Administered 2022-01-21: 2 mg via ORAL
  Filled 2022-01-20: qty 1
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
## 2022-01-21 DIAGNOSIS — F209 Schizophrenia, unspecified: Secondary | ICD-10-CM | POA: Diagnosis not present

## 2022-01-21 MED ORDER — HALOPERIDOL LACTATE 5 MG/ML IJ SOLN
5.0000 mg | INTRAMUSCULAR | Status: DC | PRN
  Administered 2022-01-23 – 2022-03-16 (×10): 5 mg via INTRAMUSCULAR
  Filled 2022-01-21 (×13): qty 1

## 2022-01-21 MED ORDER — DIPHENHYDRAMINE HCL 25 MG PO CAPS
50.0000 mg | ORAL_CAPSULE | ORAL | Status: DC | PRN
  Administered 2022-01-21 – 2022-02-22 (×7): 50 mg via ORAL
  Filled 2022-01-21 (×8): qty 2

## 2022-01-21 MED ORDER — DIPHENHYDRAMINE HCL 50 MG/ML IJ SOLN
50.0000 mg | INTRAMUSCULAR | Status: DC | PRN
Start: 2022-01-21 — End: 2022-03-19
  Administered 2022-01-23 – 2022-03-16 (×5): 50 mg via INTRAMUSCULAR
  Filled 2022-01-21 (×6): qty 1

## 2022-01-21 MED ORDER — LORAZEPAM 2 MG/ML IJ SOLN
1.0000 mg | INTRAMUSCULAR | Status: DC | PRN
  Administered 2022-01-23 – 2022-02-19 (×8): 1 mg via INTRAMUSCULAR
  Filled 2022-01-21 (×7): qty 1

## 2022-01-21 MED ORDER — PHENOBARBITAL 32.4 MG PO TABS
32.4000 mg | ORAL_TABLET | Freq: Two times a day (BID) | ORAL | Status: AC
  Administered 2022-01-21 – 2022-01-23 (×4): 32.4 mg via ORAL
  Filled 2022-01-21 (×4): qty 1

## 2022-01-21 MED ORDER — LORAZEPAM 1 MG PO TABS
1.0000 mg | ORAL_TABLET | ORAL | Status: DC | PRN
  Administered 2022-01-21 – 2022-03-12 (×20): 1 mg via ORAL
  Filled 2022-01-21 (×27): qty 1

## 2022-01-21 MED ORDER — HALOPERIDOL 5 MG PO TABS
5.0000 mg | ORAL_TABLET | ORAL | Status: DC | PRN
Start: 2022-01-21 — End: 2022-03-19
  Administered 2022-02-18 – 2022-02-22 (×4): 5 mg via ORAL
  Filled 2022-01-21 (×6): qty 1

## 2022-01-21 MED ORDER — ZIPRASIDONE MESYLATE 20 MG IM SOLR: 10.0000 mg | Freq: Once | INTRAMUSCULAR | Status: DC

## 2022-01-21 MED ORDER — CLONAZEPAM 0.5 MG PO TABS
1.0000 mg | ORAL_TABLET | Freq: Three times a day (TID) | ORAL | Status: DC
  Administered 2022-01-21 – 2022-02-19 (×88): 1 mg via ORAL
  Administered 2022-02-19: 0.5 mg via ORAL
  Administered 2022-02-20 – 2022-03-19 (×74): 1 mg via ORAL
  Filled 2022-01-21 (×6): qty 2
  Filled 2022-01-21: qty 1
  Filled 2022-01-21 (×12): qty 2
  Filled 2022-01-21: qty 1
  Filled 2022-01-21 (×13): qty 2
  Filled 2022-01-21: qty 1
  Filled 2022-01-21 (×3): qty 2
  Filled 2022-01-21: qty 1
  Filled 2022-01-21 (×5): qty 2
  Filled 2022-01-21: qty 1
  Filled 2022-01-21 (×9): qty 2
  Filled 2022-01-21: qty 1
  Filled 2022-01-21 (×8): qty 2
  Filled 2022-01-21: qty 1
  Filled 2022-01-21 (×3): qty 2
  Filled 2022-01-21: qty 1
  Filled 2022-01-21 (×3): qty 2
  Filled 2022-01-21: qty 1
  Filled 2022-01-21 (×2): qty 2
  Filled 2022-01-21: qty 1
  Filled 2022-01-21 (×11): qty 2
  Filled 2022-01-21: qty 1
  Filled 2022-01-21 (×6): qty 2
  Filled 2022-01-21: qty 1
  Filled 2022-01-21 (×8): qty 2
  Filled 2022-01-21: qty 1
  Filled 2022-01-21: qty 2
  Filled 2022-01-21: qty 1
  Filled 2022-01-21 (×8): qty 2
  Filled 2022-01-21: qty 1
  Filled 2022-01-21 (×4): qty 2
  Filled 2022-01-21: qty 1
  Filled 2022-01-21: qty 2
  Filled 2022-01-21: qty 1
  Filled 2022-01-21 (×3): qty 2
  Filled 2022-01-21: qty 1
  Filled 2022-01-21 (×18): qty 2
  Filled 2022-01-21: qty 1
  Filled 2022-01-21 (×3): qty 2
  Filled 2022-01-21: qty 1
  Filled 2022-01-21 (×5): qty 2
  Filled 2022-01-21 (×2): qty 1
  Filled 2022-01-21 (×5): qty 2
  Filled 2022-01-21: qty 1
  Filled 2022-01-21 (×3): qty 2
  Filled 2022-01-21: qty 1
  Filled 2022-01-21 (×3): qty 2

## 2022-01-21 MED ORDER — LORAZEPAM 2 MG/ML IJ SOLN
1.0000 mg | Freq: Four times a day (QID) | INTRAMUSCULAR | Status: DC | PRN
Start: 2022-01-21 — End: 2022-01-21

## 2022-01-21 MED ORDER — LORAZEPAM 1 MG PO TABS: 1.0000 mg | ORAL_TABLET | Freq: Four times a day (QID) | ORAL | Status: DC | PRN
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
## 2022-01-21 NOTE — Consult Note (Signed)
[facility] Face-to-Face Psychiatry Followup Consult   Reason for Consult:  polypharmacy with multiple benzos and phenobarbital.  Referring Physician:  Dr. 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 male patient admitted with Schizophrenia, and on multiple benzos ( Ativan + Klonopin + temazepam + phenobarbital)  HPI:   Patient ID: Adam [name], male   DOB: 23-Feb-1961, 61 y.o.   MRN: [id number] 60 year old man with schizophrenia who remains on the medical floor awaiting placement.  Patient had been 3 concurrent antipsychotics including Invega Sustenna, Haldol and Clozapine.  Adam [name] has also been on multiple benzos including Ativan, Klonopin, Restoril along with Phenobarbital.  Adam [name] continues to get intermittently agitated given his poor impulse control and cognitive impairment.    Today, pharmacy requested to consolidate his meds regimen and RN reported agitated when his headphone did not work briefly.   Pt seen in the room with a sitter. Adam [name] is awake, alert and has calmed down. Adam [name] is pleasantly confused during my interviewed, but RN reports intermittent agitation that did not respond to haldol, ativan and benadryl, earlier.   Past Psychiatric History: see previous note  Past Medical History:  Past Medical History:  Diagnosis Date   Myocardial infarction (HCC)     Schizophrenia (HCC)    Stroke (HCC)    TBI (traumatic brain injury)    History reviewed. No pertinent surgical history. Family History: History reviewed. No pertinent family history. Family Psychiatric  History: unknown Social History:  Social History   Substance and Sexual Activity  Alcohol Use Not Currently     Social History   Substance and Sexual Activity  Drug Use Not Currently    Social History   Socioeconomic History   Marital status: Single    Spouse name: Not on file   Number of children: Not on file   Years of education: Not on file   Highest education level: Not on file  Occupational History   Not on file  Tobacco Use   Smoking status: Never    Passive exposure: Never   Smokeless tobacco: Never  Vaping Use   Vaping Use: Unknown  Substance and Sexual Activity   Alcohol use: Not Currently   Drug use: Not Currently   Sexual activity: Not Currently  Other Topics Concern   Not on file  Social History Narrative   Not on file   Social Determinants of Health   Financial Resource Strain: Not on file  Food Insecurity: Not on file  Transportation Needs: Not on file  Physical Activity: Not on file  Stress: Not on file  Social Connections: Not on file   Additional Social History:    Allergies:   Allergies  Allergen Reactions   Morphine And Related    Nsaids     Labs: No results found for this or any previous visit (from the past 48 hour(s)).  Current Facility-Administered Medications  Medication Dose Route Frequency  Provider Last Rate Last Admin   acetaminophen (TYLENOL) tablet 650 mg  650 mg Oral Q4H PRN Carollee Herter, DO   650 mg at 01/18/22 1138   alum & mag hydroxide-simeth (MAALOX/MYLANTA) 200-200-20 MG/5ML suspension 30 mL  30 mL Oral Q6H PRN Lurene Shadow, MD   30 mL at 01/07/22 6269   aspirin chewable tablet 81 mg  81 mg Oral Daily Lolita Patella B, MD   81 mg at 01/21/22 4854   benztropine (COGENTIN) tablet 0.5 mg  0.5 mg Oral BID Carollee Herter, DO   0.5 mg at 01/21/22 6270   bisacodyl (DULCOLAX) EC tablet 10 mg  10 mg Oral Daily PRN Gillis Santa, MD       bisacodyl (DULCOLAX) suppository 10 mg  10 mg Rectal Daily PRN Gillis Santa, MD       clonazePAM Scarlette [place]) tablet 0.5 mg  0.5 mg Oral TID [name], [name], MD   0.5 mg at 01/21/22 3500   cloZAPine (CLOZARIL) tablet 150 mg  150 mg Oral QHS [name], [name], MD   150 mg at 01/20/22 2209   diphenhydrAMINE (BENADRYL) capsule 50 mg  50 mg Oral Q6H PRN Gabriel Cirri F, NP   50 mg at 01/21/22 0756   Or   diphenhydrAMINE (BENADRYL) injection 50 mg  50 mg Intramuscular Q6H PRN Gabriel Cirri F, NP   50 mg at 01/19/22 0158   docusate sodium (COLACE) capsule 100 mg  100 mg Oral BID [name], [name], MD   100 mg at 01/21/22 0928   famotidine (PEPCID) tablet 20 mg  20 mg Oral BID PRN Carollee Herter, DO   20 mg at 11/15/21 9381   folic acid (FOLVITE) tablet 1 mg  1 mg Oral Daily Lolita Patella B, MD   1 mg at 01/21/22 8299   gabapentin (NEURONTIN) capsule 300 mg  300 mg Oral TID Esaw Grandchild A, DO   300 mg at 01/21/22 3716   haloperidol (HALDOL) tablet 5 mg  5 mg Oral TID [place], Eric J, DO   5 mg at 01/21/22 9678   haloperidol (HALDOL) tablet 5 mg  5 mg Oral Q6H PRN Gabriel Cirri F, NP   5 mg at 01/21/22 0757   Or   haloperidol lactate (HALDOL) injection 5 mg  5 mg Intramuscular Q6H PRN Gabriel Cirri F, NP   5 mg at 01/19/22 0158   loperamide (IMODIUM) capsule 2 mg  2 mg Oral PRN Esaw Grandchild A, DO   2 mg at 01/13/22 1408   LORazepam (ATIVAN) injection 1 mg  1 mg Intravenous Q4H PRN Carollee Herter, DO   1 mg at 12/04/21 1916   LORazepam (ATIVAN) injection 2 mg  2 mg Intramuscular Q6H PRN Darlin Priestly, MD   2 mg at 01/16/22 1306   LORazepam (ATIVAN) tablet 1 mg  1 mg Oral TID Lolita Patella B, MD   1 mg at 01/21/22 0928   LORazepam (ATIVAN) tablet 2 mg  2 mg Oral Q6H PRN Gillis Santa, MD   2 mg at 01/21/22 0756   MEDLINE mouth rinse  15 mL Mouth Rinse BID Darlin Priestly, MD   15 mL  at 01/21/22 0101   metoprolol succinate (TOPROL-XL) 24 hr tablet 25 mg  25 mg Oral Daily Darlin Priestly, MD   25 mg at 01/21/22 9381   multivitamin with minerals tablet 1 tablet  1 tablet Oral Daily Carollee Herter, DO   1 tablet at 01/21/22 0175   nitroGLYCERIN (NITROSTAT) SL tablet  0.4 mg  0.4 mg Sublingual Q5 Min x 3 PRN Carollee Herter, DO       ondansetron [facility]) injection 4 mg  4 mg Intravenous Q6H PRN Carollee Herter, DO       [START ON 01/30/2022] paliperidone (INVEGA SUSTENNA) injection 234 mg  234 mg Intramuscular Q28 days [name], [name], MD       PHENobarbital (LUMINAL) tablet 32.4 mg  32.4 mg Oral TID Darlin Priestly, MD   32.4 mg at 01/21/22 3361   polyethylene glycol (MIRALAX / GLYCOLAX) packet 17 g  17 g Oral BID Lolita Patella B, MD   17 g at 01/21/22 2244   simethicone (MYLICON) chewable tablet 80 mg  80 mg Oral Q6H PRN Esaw Grandchild A, DO   80 mg at 01/13/22 1408   temazepam (RESTORIL) capsule 15 mg  15 mg Oral QHS Carollee Herter, DO   15 mg at 01/20/22 2203   thiamine tablet 100 mg  100 mg Oral Daily Lolita Patella B, MD   100 mg at 01/21/22 9753    Musculoskeletal: Strength & Muscle Tone: abnormal Gait & Station: unsteady Patient leans: N/A  Psychiatric Specialty Exam:  Presentation  General Appearance: Disheveled  Eye Contact:None  Speech:Clear and Coherent  Speech Volume:Increased  Handedness:Right   Mood and Affect  Mood:Anxious; Irritable  Affect:Congruent   Thought Process  Thought Processes:Disorganized  Descriptions of Associations:Loose  Orientation:Partial  Thought Content:Scattered; Illogical  History of Schizophrenia/Schizoaffective disorder:Yes  Duration of Psychotic Symptoms:Greater than six months  Hallucinations:No data recorded Ideas of Reference:-- (UTA)  Suicidal Thoughts:No data recorded Homicidal Thoughts:No data recorded  Sensorium  Memory:Remote Poor; Recent Poor; Immediate Poor; Other (comment) (impaired memory and  cognition)  Judgment:Impaired  Insight:Lacking   Executive Functions  Concentration:Poor  Attention Span:Poor  Recall:Poor  Fund of Knowledge:Poor  Language:Poor   Psychomotor Activity  Psychomotor Activity:No data recorded  Assets  Assets:Housing; Resilience; Social Support; Talents/Skills   Sleep  Sleep:No data recorded  Physical Exam: Physical Exam ROS Blood pressure 105/71, pulse 98, temperature 98.1 F (36.7 C), resp. rate 20, height 5\' 8"  (1.727 m), weight 80 kg, SpO2 100 %. Body mass index is 26.82 kg/m.  Treatment Plan Summary: Daily contact with patient to assess and evaluate symptoms and progress in treatment and Medication management  Schizophrenia -- continue Clozapine 150mg  daily.  -- continue [place] 234mg  IM, q4weeks, next due on 01/30/22 -- continue haldol 5mg  TID -- continue to provide haldol and ativan PRN, and changed to q4h with IM backup.   Dementia, likely vascular dementia, with agitation, Poor impulse control -- pt is on polypharmacy, needs to reduce several benzos.  -- start taper phenobarbital from 32.4mg  TID to BID to daily.  For total of 4 does. Pt will be off phenobarbital on 01/23/22.  -- consolidate Klonopin and Ativan.  Will increase Klonopin to 1mg  TID, and STOP Ativan 1mg  TID.   -- continue Temazepam 15mg  qhs for now.   -- psych will follow intermittently.     Disposition: No evidence of imminent risk to self or others at present.   Supportive therapy provided about ongoing stressors.  Anna Beaird, MD 01/21/2022 12:40 PM

## 2022-01-22 DIAGNOSIS — F209 Schizophrenia, unspecified: Secondary | ICD-10-CM | POA: Diagnosis not present

## 2022-01-22 LAB — CBC WITH DIFFERENTIAL/PLATELET
Abs Immature Granulocytes: 0.05 10*3/uL (ref 0.00–0.07)
Basophils Absolute: 0 10*3/uL (ref 0.0–0.1)
Basophils Relative: 1 %
Eosinophils Absolute: 0.3 10*3/uL (ref 0.0–0.5)
Eosinophils Relative: 4 %
HCT: 40.8 % (ref 39.0–52.0)
Hemoglobin: 13.7 g/dL (ref 13.0–17.0)
Immature Granulocytes: 1 %
Lymphocytes Relative: 22 %
Lymphs Abs: 1.3 10*3/uL (ref 0.7–4.0)
MCH: 31.5 pg (ref 26.0–34.0)
MCHC: 33.6 g/dL (ref 30.0–36.0)
MCV: 93.8 fL (ref 80.0–100.0)
Monocytes Absolute: 0.4 10*3/uL (ref 0.1–1.0)
Monocytes Relative: 7 %
Neutro Abs: 4 10*3/uL (ref 1.7–7.7)
Neutrophils Relative %: 65 %
Platelets: 229 10*3/uL (ref 150–400)
RBC: 4.35 MIL/uL (ref 4.22–5.81)
RDW: 13.9 % (ref 11.5–15.5)
WBC: 6 10*3/uL (ref 4.0–10.5)
nRBC: 0 % (ref 0.0–0.2)

## 2022-01-22 LAB — BASIC METABOLIC PANEL
Anion gap: 9 (ref 5–15)
BUN: 26 mg/dL — ABNORMAL HIGH (ref 8–23)
CO2: 25 mmol/L (ref 22–32)
Calcium: 9.3 mg/dL (ref 8.9–10.3)
Chloride: 106 mmol/L (ref 98–111)
Creatinine, Ser: 0.95 mg/dL (ref 0.61–1.24)
GFR, Estimated: >60 mL/min (ref >60)
Glucose, Bld: 125 mg/dL — ABNORMAL HIGH (ref 70–99)
Potassium: 4.4 mmol/L (ref 3.5–5.1)
Sodium: 140 mmol/L (ref 135–145)

## 2022-01-22 LAB — MAGNESIUM: Magnesium: 2 mg/dL (ref 1.7–2.4)

## 2022-01-22 LAB — PHOSPHORUS: Phosphorus: 3.4 mg/dL (ref 2.5–4.6)

## 2022-01-22 LAB — CK: Total CK: 1629 U/L — ABNORMAL HIGH (ref 49–397)

## 2022-01-22 MED ORDER — CLOZAPINE 25 MG PO TABS
175.0000 mg | ORAL_TABLET | Freq: Every day | ORAL | Status: DC
  Administered 2022-01-22 – 2022-01-23 (×2): 175 mg via ORAL
  Filled 2022-01-22 (×3): qty 3
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
## 2022-01-22 NOTE — TOC Progression Note (Signed)
Transition of Care [facility]) - Progression Note    Patient Details  Name: Adam [name] MRN: [id number] Date of Birth: 06/23/1961  Transition of Care [facility]) CM/SW Contact  Chapman Fitch, RN Phone Number: 01/22/2022, 11:52 AM  Clinical Narrative:     VM left for Ezequiel Ganser for follow up  Expected Discharge Plan: Group Home Barriers to Discharge: Continued Medical Work up  Expected Discharge Plan and Services Expected Discharge Plan: Group Home In-house Referral: Clinical Social Work     Living arrangements for the past 2 months: Group Home                                       Social Determinants of Health (SDOH) Interventions    Readmission Risk Interventions No flowsheet data found.

## 2022-01-22 NOTE — TOC Progression Note (Signed)
Transition of Care [facility]) - Progression Note    Patient Details  Name: Adam [name] MRN: [id number] Date of Birth: 04/14/1961  Transition of Care [facility]) CM/SW Contact  Beverly Sessions, RN Phone Number: 01/22/2022, 4:02 PM  Clinical Narrative:     Received call from patients mother.  She request when patient is moved to a different room for her to be notified.   Department director and Bedside RN notified.   Mother confirms that she lives in an independent living community and patient can 100% not come and stay with her at discharge   Expected Discharge Plan: Group Home Barriers to Discharge: Continued Medical Work up  Expected Discharge Plan and Services Expected Discharge Plan: Group Home In-house Referral: Clinical Social Work     Living arrangements for the past 2 months: Group Home                                       Social Determinants of Health (SDOH) Interventions    Readmission Risk Interventions No flowsheet data found.

## 2022-01-23 DIAGNOSIS — F209 Schizophrenia, unspecified: Secondary | ICD-10-CM | POA: Diagnosis not present
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
## 2022-01-23 NOTE — TOC Progression Note (Signed)
Transition of Care [facility]) - Progression Note    Patient Details  Name: Adam [name] MRN: [id number] Date of Birth: 01-25-1961  Transition of Care [facility]) CM/SW [place], LCSW Phone Number: 01/23/2022, 8:42 AM  Clinical Narrative:   [facility] has declined patient because he is "too acute, psychiatrically." Left voicemail for Wynona Neat, admissions coordinator, regarding appeal.  Expected Discharge Plan: Group Home Barriers to Discharge: Continued Medical Work up  Expected Discharge Plan and Services Expected Discharge Plan: Group Home In-house Referral: Clinical Social Work     Living arrangements for the past 2 months: Group Home                                       Social Determinants of Health (SDOH) Interventions    Readmission Risk Interventions No flowsheet data found.

## 2022-01-23 NOTE — Progress Notes (Signed)
Pharmacy Consult - Clozapine     61 yo male ordered clozapine 100 mg PO QHS  This patient's order has been reviewed for prescribing contraindications.    Clozapine REMS enrollment Verified: yes on 01/04/22  REMS patient ID: [id number] Current Outpatient Monitoring: Monthly    Home Regimen:  New Start   Dose Adjustments This Admission: 2/3 started on clozapine 25mg   2/10 clozapine 100mg   2/20 Clozapine 175mg    Labs: Date: 01/04/22    ANC: 2800   Submitted: 01/04/22 Date: 01/14/22 ANC: 2700   Submitted: 01/14/22 Date: 01/22/22 ANC: 4000   Submitted: 01/22/22      Plan: Patient continue clozapine with weekly monitoring Continue  clozapine 100mg  QHS Monitor ANC at least weekly while inpatient   Adam [name] PharmD, BCPS 01/23/2022 7:49 AM
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
## 2022-01-24 DIAGNOSIS — F209 Schizophrenia, unspecified: Secondary | ICD-10-CM | POA: Diagnosis not present

## 2022-01-24 MED ORDER — CLOZAPINE 100 MG PO TABS
200.0000 mg | ORAL_TABLET | Freq: Every day | ORAL | Status: DC
  Administered 2022-01-24 – 2022-02-04 (×12): 200 mg via ORAL
  Filled 2022-01-24 (×14): qty 2
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
## 2022-01-24 NOTE — TOC Progression Note (Addendum)
Transition of Care [facility]) - Progression Note    Patient Details  Name: Juvens Matton MRN: [id number] Date of Birth: 1961/01/01  Transition of Care [facility] [place]) CM/SW [place], LCSW Phone Number: 01/24/2022, 12:46 PM  Clinical Narrative:   Left another voicemail for [facility] regarding appeal.  1:11 pm: Updated mom.  Expected Discharge Plan: Group Home Barriers to Discharge: Continued Medical Work up  Expected Discharge Plan and Services Expected Discharge Plan: Group Home In-house Referral: Clinical Social Work     Living arrangements for the past 2 months: Group Home                                       Social Determinants of Health (SDOH) Interventions    Readmission Risk Interventions No flowsheet data found.

## 2022-01-25 DIAGNOSIS — F209 Schizophrenia, unspecified: Secondary | ICD-10-CM | POA: Diagnosis not present
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
## 2022-01-25 NOTE — Progress Notes (Signed)
[facility] MD Progress Note  01/25/2022 5:33 PM Nox Talent  MRN:  [id number] Subjective: Follow-up consult 61 year old man with a history of head injury and schizophrenia.  Patient seen and chart reviewed.  Patient himself had no new complaints.  Found him casually but neatly dressed sitting in his room watching a movie on his cell phone.  He recognized me immediately.  Shared some of his typical vocalizations which as usual do not really make any sense.  The nurse in the room tells me that he has been "a little loud" at times but not aggressive or threatening and relatively doing well today.  Patient of course is very limited in his ability to communicate but did not appear to be trying to complain of anything.  After we finished talking he did follow me out into the main area of the ward twice and had to be redirected back to his room but was not violent. 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)   Myocardial infarction (HCC)   Acute pulmonary edema (HCC)   DNR (do not resuscitate)/DNI(Do Not Intubate)   NSTEMI (non-ST elevated myocardial infarction) (HCC)   Acute metabolic encephalopathy   TBI (traumatic brain injury)   Stroke (HCC)   Fever   Elevated CK  Total Time spent with patient: 30 minutes  Past Psychiatric History: Patient has a longstanding history of chronic general impairment with little change from his baseline  Past Medical History:  Past Medical History:  Diagnosis Date   Myocardial infarction (HCC)    Schizophrenia (HCC)    Stroke (HCC)    TBI (traumatic brain injury)    History reviewed. No pertinent surgical history. Family History: History reviewed. No pertinent family history. Family Psychiatric  History: See previous Social History:  Social  History   Substance and Sexual Activity  Alcohol Use Not Currently     Social History   Substance and Sexual Activity  Drug Use Not Currently    Social History   Socioeconomic History   Marital status: Single    Spouse name: Not on file   Number of children: Not on file   Years of education: Not on file   Highest education level: Not on file  Occupational History   Not on file  Tobacco Use   Smoking status: Never    Passive exposure: Never   Smokeless tobacco: Never  Vaping Use   Vaping Use: Unknown  Substance and Sexual Activity   Alcohol use: Not Currently   Drug use: Not Currently   Sexual activity: Not Currently  Other Topics Concern   Not on file  Social History Narrative   Not on file   Social Determinants of Health   Financial Resource Strain: Not on file  Food Insecurity: Not on file  Transportation Needs: Not on file  Physical Activity: Not on file  Stress: Not on file  Social Connections: Not on file   Additional Social History:    Pain Medications: See PTA Prescriptions: See PTA Over the Counter: See PTA History of alcohol / drug use?: No history of alcohol / drug abuse                    Sleep: Fair  Appetite:  Fair  Current Medications: Current Facility-Administered Medications  Medication Dose Route Frequency Provider Last Rate Last Admin  acetaminophen (TYLENOL) tablet 650 mg  650 mg Oral Q4H PRN Carollee Herter, DO   650 mg at 01/23/22 2058   alum & mag hydroxide-simeth (MAALOX/MYLANTA) 200-200-20 MG/5ML suspension 30 mL  30 mL Oral Q6H PRN Lurene Shadow, MD   30 mL at 01/07/22 6301   aspirin chewable tablet 81 mg  81 mg Oral Daily Lolita Patella B, MD   81 mg at 01/25/22 1004   benztropine (COGENTIN) tablet 0.5 mg  0.5 mg Oral BID Carollee Herter, DO   0.5 mg at 01/25/22 1004   bisacodyl (DULCOLAX) EC tablet 10 mg  10 mg Oral Daily PRN Gillis Santa, MD       clonazePAM Scarlette Calico) tablet 1 mg  1 mg Oral TID He, [name], MD   1 mg at  01/25/22 1625   cloZAPine (CLOZARIL) tablet 200 mg  200 mg Oral QHS Trisha Morandi T, MD   200 mg at 01/24/22 2146   diphenhydrAMINE (BENADRYL) capsule 50 mg  50 mg Oral Q4H PRN He, [name], MD   50 mg at 01/22/22 2141   Or   diphenhydrAMINE (BENADRYL) injection 50 mg  50 mg Intramuscular Q4H PRN He, [name], MD   50 mg at 01/23/22 2121   docusate sodium (COLACE) capsule 100 mg  100 mg Oral BID Orion Vandervort T, MD   100 mg at 01/25/22 1004   folic acid (FOLVITE) tablet 1 mg  1 mg Oral Daily Georgeann Oppenheim, Sudheer B, MD   1 mg at 01/25/22 1005   gabapentin (NEURONTIN) capsule 300 mg  300 mg Oral TID Esaw Grandchild A, DO   300 mg at 01/25/22 1625   haloperidol (HALDOL) tablet 5 mg  5 mg Oral TID [place], Eric J, DO   5 mg at 01/25/22 1625   haloperidol (HALDOL) tablet 5 mg  5 mg Oral Q4H PRN He, [name], MD       Or   haloperidol lactate (HALDOL) injection 5 mg  5 mg Intramuscular Q4H PRN He, [name], MD   5 mg at 01/23/22 2120   LORazepam (ATIVAN) injection 1 mg  1 mg Intramuscular Q4H PRN He, [name], MD   1 mg at 01/23/22 2235   LORazepam (ATIVAN) tablet 1 mg  1 mg Oral Q4H PRN He, [name], MD   1 mg at 01/23/22 1015   MEDLINE mouth rinse  15 mL Mouth Rinse BID Darlin Priestly, MD   15 mL at 01/24/22 2149   metoprolol succinate (TOPROL-XL) 24 hr tablet 25 mg  25 mg Oral Daily Darlin Priestly, MD   25 mg at 01/25/22 1005   multivitamin with minerals tablet 1 tablet  1 tablet Oral Daily Carollee Herter, DO   1 tablet at 01/25/22 1004   [START ON 01/30/2022] paliperidone (INVEGA SUSTENNA) injection 234 mg  234 mg Intramuscular Q28 days Sukanya Goldblatt T, MD       polyethylene glycol (MIRALAX / GLYCOLAX) packet 17 g  17 g Oral BID Georgeann Oppenheim, Sudheer B, MD   17 g at 01/22/22 2114   temazepam (RESTORIL) capsule 15 mg  15 mg Oral QHS Carollee Herter, DO   15 mg at 01/24/22 2147   thiamine tablet 100 mg  100 mg Oral Daily Lolita Patella B, MD   100 mg at 01/25/22 1004    Lab Results: No results found for this or any previous visit (from the past 48  hour(s)).  Blood Alcohol level:  Lab Results  Component Value Date   ETH <10 07/20/2021  Metabolic Disorder Labs: No results found for: HGBA1C, MPG No results found for: PROLACTIN Lab Results  Component Value Date   CHOL 167 11/20/2021   TRIG 107 11/20/2021   HDL 26 (L) 11/20/2021   CHOLHDL 6.4 11/20/2021   VLDL 21 11/20/2021   LDLCALC 120 (H) 11/20/2021    Physical Findings: AIMS:  , ,  ,  ,    CIWA:    COWS:     Musculoskeletal: Strength & Muscle Tone: within normal limits Gait & Station: normal Patient leans: N/A  Psychiatric Specialty Exam:  Presentation  General Appearance: Disheveled  Eye Contact:None  Speech:Clear and Coherent  Speech Volume:Increased  Handedness:Right   Mood and Affect  Mood:Anxious; Irritable  Affect:Congruent   Thought Process  Thought Processes:Disorganized  Descriptions of Associations:Loose  Orientation:Partial  Thought Content:Scattered; Illogical  History of Schizophrenia/Schizoaffective disorder:Yes  Duration of Psychotic Symptoms:Greater than six months  Hallucinations:No data recorded Ideas of Reference:-- (UTA)  Suicidal Thoughts:No data recorded Homicidal Thoughts:No data recorded  Sensorium  Memory:Remote Poor; Recent Poor; Immediate Poor; Other (comment) (impaired memory and cognition)  Judgment:Impaired  Insight:Lacking   Executive Functions  Concentration:Poor  Attention Span:Poor  Recall:Poor  Fund of Knowledge:Poor  Language:Poor   Psychomotor Activity  Psychomotor Activity:No data recorded  Assets  Assets:Housing; Resilience; Social Support; Talents/Skills   Sleep  Sleep:No data recorded   Physical Exam: Physical Exam Vitals and nursing note reviewed.  Constitutional:      Appearance: Normal appearance.  HENT:     Head: Normocephalic and atraumatic.     Mouth/Throat:     Pharynx: Oropharynx is clear.  Eyes:     Pupils: Pupils are equal, round, and reactive to  light.  Cardiovascular:     Rate and Rhythm: Normal rate and regular rhythm.  Pulmonary:     Effort: Pulmonary effort is normal.     Breath sounds: Normal breath sounds.  Abdominal:     General: Abdomen is flat.     Palpations: Abdomen is soft.  Musculoskeletal:        General: Normal range of motion.  Skin:    General: Skin is warm and dry.  Neurological:     General: No focal deficit present.     Mental Status: He is alert. Mental status is at baseline.  Psychiatric:        Attention and Perception: He is inattentive.        Mood and Affect: Mood normal. Affect is labile and inappropriate.        Speech: He is noncommunicative.        Behavior: Behavior is agitated. Behavior is not aggressive.        Cognition and Memory: Cognition is impaired. Memory is impaired.   Review of Systems  Unable to perform ROS: Dementia  Blood pressure 109/72, pulse 92, temperature 97.8 F (36.6 C), resp. rate 18, height 5\' 8"  (1.727 m), weight 80 kg, SpO2 98 %. Body mass index is 26.82 kg/m.   Treatment Plan Summary: Plan trial of clozapine continues.  He has been at 200 mg for a couple of days.  I will go ahead and order a clozapine blood level to be drawn tomorrow morning.  Continue current medicine for now.  Involved in conversations with multiple stakeholders in the hospital regarding appropriate overall treatment plan.  Mordecai Rasmussen, MD 01/25/2022, 5:33 PM

## 2022-01-25 NOTE — Progress Notes (Signed)
Pt slammed door on sitters foot

## 2022-01-26 DIAGNOSIS — F209 Schizophrenia, unspecified: Secondary | ICD-10-CM | POA: Diagnosis not present

## 2022-01-26 NOTE — Progress Notes (Signed)
[facility]  - [place] at [facility]   PATIENT NAME: Adam [name]    MR#:  [id number]  DATE OF BIRTH:  12/06/1960  SUBJECTIVE:   walking around the room and in the hallways.  VITALS:  Blood pressure 109/72, pulse 92, temperature 97.8 F (36.6 C), resp. rate 18, height 5\' 8"  (1.727 m), weight 80 kg, SpO2 98 %.  PHYSICAL EXAMINATION:   GENERAL:  61 y.o.-year-old patient lying in the bed with no acute distress.  NEUROLOGIC: nonfocal  patient is alert unable to hold meaningful conversation  LABORATORY PANEL:  CBC Recent Labs  Lab 01/22/22 1332  WBC 6.0  HGB 13.7  HCT 40.8  PLT 229     Chemistries  Recent Labs  Lab 01/22/22 1332  NA 140  K 4.4  CL 106  CO2 25  GLUCOSE 125*  BUN 26*  CREATININE 0.95  CALCIUM 9.3  MG 2.0    Cardiac Enzymes No results for input(s): TROPONINI in the last 168 hours. RADIOLOGY:  No results found.  Assessment and Plan  61 yo WM with hx of schizophrenia and TBI, who was initially admitted to [facility] ER on July 20, 2021. He spent more than 123 days in the [facility] ED awaiting psychiatric placement.  He was initially brought to [facility] ER due to violent outbursts at his group home.  He was IVC'd initially.  He was noted to be lethargic 12/19, found to have NSTEMI.  Pt was admitted to hospitalist service.  Cardiology consulted who recommended no intervention.     Schizophrenia chronic with episodes of acute exacerbation -- continues to have intermittent bouts of violent outburst. -- Sitter in the room. -- cont present meds per Dr 1/20 (psych)  history of CVA -- continue aspirin and statins  elevated CK suspected due to IM shots according to psych  history of CAD with non-STEMI previous admission -- treat with aspirin, statins, metoprolol -- per cardiology medical management   cognitive and neuro- psych behavioral changes secondary to TBI -- challenging disposition patient has been here since August 2022 -- TOC and  leadership involved -- see daily TOC notes for updates   Consults : psychiatry CODE STATUS: DNR DNI DVT Prophylaxis : ambulation Level of care: Med-Surg Status is: Inpatient  challenging discharge disposition!      TOTAL TIME TAKING CARE OF THIS PATIENT: 10 minutes.  >50% time spent on counselling and coordination of care  Note: This dictation was prepared with Dragon dictation along with smaller phrase technology. Any transcriptional errors that result from this process are unintentional.  September 2022 M.D    [facility]   CC: Primary care physician; Pcp, No

## 2022-01-26 NOTE — TOC Progression Note (Addendum)
Transition of Care [facility]) - Progression Note    Patient Details  Name: Adam [name] MRN: [id number] Date of Birth: [birth year]  Transition of Care [facility]) CM/SW Roslyn, LCSW Phone Number: 01/26/2022, 9:23 AM  Clinical Narrative:   Left another voicemail for [facility] regarding appeals process.  11:45 am: Met with patient's mother in unit director's office. She is potentially interested in the state taking over guardianship. Left voicemail for APS social worker, Steffanie Dunn.  Expected Discharge Plan: Group Home Barriers to Discharge: Continued Medical Work up  Expected Discharge Plan and Services Expected Discharge Plan: Group Home In-house Referral: Clinical Social Work     Living arrangements for the past 2 months: Group Home                                       Social Determinants of Health (SDOH) Interventions    Readmission Risk Interventions No flowsheet data found.

## 2022-01-26 NOTE — Plan of Care (Signed)

## 2022-01-27 NOTE — Progress Notes (Signed)
[place] at [place] NAME: Adam [name]    MR#:  [id number]  DATE OF BIRTH:  [birth year]  SUBJECTIVE:   walking around the room and in the hallways.  VITALS:  Blood pressure 102/69, pulse 90, temperature 97.7 F (36.5 C), resp. rate 20, height 5\' 8"  (1.727 m), weight 80 kg, SpO2 98 %.  PHYSICAL EXAMINATION:   GENERAL:  61 y.o.-year-old patient lying in the bed with no acute distress.  NEUROLOGIC: nonfocal  patient is alert unable to hold meaningful conversation  LABORATORY PANEL:  CBC Recent Labs  Lab 01/22/22 1332  WBC 6.0  HGB 13.7  HCT 40.8  PLT 229     Chemistries  Recent Labs  Lab 01/22/22 1332  NA 140  K 4.4  CL 106  CO2 25  GLUCOSE 125*  BUN 26*  CREATININE 0.95  CALCIUM 9.3  MG 2.0    Cardiac Enzymes No results for input(s): TROPONINI in the last 168 hours. RADIOLOGY:  No results found.  Assessment and Plan  61 yo WM with hx of schizophrenia and TBI, who was initially admitted to [facility] ER on July 20, 2021. He spent more than 123 days in the [facility] ED awaiting psychiatric placement.  He was initially brought to [facility] ER due to violent outbursts at his group home.  He was IVC'd initially.  He was noted to be lethargic 12/19, found to have NSTEMI.  Pt was admitted to hospitalist service.  Cardiology consulted who recommended no intervention.     Schizophrenia chronic with episodes of acute exacerbation -- continues to have intermittent bouts of violent outburst. -- Sitter in the room. -- cont present meds per Dr Weber Cooks (psych)  history of CVA -- continue aspirin and statins  Elevated CK suspected due to IM shots according to psych  history of CAD with non-STEMI previous admission --on aspirin, statins, metoprolol -- per cardiology medical management   cognitive and neuro- psych behavioral changes secondary to TBI -- challenging disposition patient has been here since August 2022 -- see daily TOC notes  for updates   Consults : psychiatry CODE STATUS: DNR DNI DVT Prophylaxis : ambulation Level of care: Med-Surg Status is: Inpatient  challenging discharge disposition!      TOTAL TIME TAKING CARE OF THIS PATIENT: 10 minutes.  >50% time spent on counselling and coordination of care  Note: This dictation was prepared with Dragon dictation along with smaller phrase technology. Any transcriptional errors that result from this process are unintentional.  Fritzi Mandes M.D    [facility]   CC: Primary care physician; Pcp, No

## 2022-01-28 NOTE — Progress Notes (Signed)
Unknown EDD

## 2022-01-28 NOTE — Progress Notes (Signed)
[facility]  - [place] at [facility]   PATIENT NAME: Adam [name]    MR#:  [id number]  DATE OF BIRTH:  01/23/1961  SUBJECTIVE:   walking around the room and in the hallways.  sitter VITALS:  Blood pressure 115/76, pulse 95, temperature 97.6 F (36.4 C), temperature source Oral, resp. rate 20, height 5\' 8"  (1.727 m), weight 80 kg, SpO2 95 %.  PHYSICAL EXAMINATION:   GENERAL:  61 y.o.-year-old patient lying in the bed with no acute distress.  NEUROLOGIC: nonfocal  patient is alert unable to hold meaningful conversation  LABORATORY PANEL:  CBC Recent Labs  Lab 01/22/22 1332  WBC 6.0  HGB 13.7  HCT 40.8  PLT 229     Chemistries  Recent Labs  Lab 01/22/22 1332  NA 140  K 4.4  CL 106  CO2 25  GLUCOSE 125*  BUN 26*  CREATININE 0.95  CALCIUM 9.3  MG 2.0    Cardiac Enzymes No results for input(s): TROPONINI in the last 168 hours. RADIOLOGY:  No results found.  Assessment and Plan  61 yo WM with hx of schizophrenia and TBI, who was initially admitted to [facility] ER on July 20, 2021. He spent more than 123 days in the [facility] ED awaiting psychiatric placement.  He was initially brought to [facility] ER due to violent outbursts at his group home.  He was IVC'd initially.  He was noted to be lethargic 12/19, found to have NSTEMI.  Pt was admitted to hospitalist service.  Cardiology consulted who recommended no intervention.     Schizophrenia chronic with episodes of acute exacerbation -- continues to have intermittent bouts of violent outburst. -- Sitter in the room. -- cont present meds per Dr 1/20 (psych)  history of CVA -- continue aspirin and statins  Elevated CK suspected due to IM shots according to psych  history of CAD with non-STEMI previous admission --on aspirin, statins, metoprolol -- per cardiology medical management   cognitive and neuro- psych behavioral changes secondary to TBI -- challenging disposition patient has been here since  August 2022 -- see daily TOC notes for updates   Consults : psychiatry CODE STATUS: DNR DNI DVT Prophylaxis : ambulation Level of care: Med-Surg Status is: Inpatient  challenging discharge disposition!      TOTAL TIME TAKING CARE OF THIS PATIENT: 10 minutes.  >50% time spent on counselling and coordination of care  Note: This dictation was prepared with Dragon dictation along with smaller phrase technology. Any transcriptional errors that result from this process are unintentional.  September 2022 M.D    [facility]   CC: Primary care physician; Pcp, No

## 2022-01-29 LAB — CBC WITH DIFFERENTIAL/PLATELET
Abs Immature Granulocytes: 0.03 10*3/uL (ref 0.00–0.07)
Basophils Absolute: 0.1 10*3/uL (ref 0.0–0.1)
Basophils Relative: 2 %
Eosinophils Absolute: 0.2 10*3/uL (ref 0.0–0.5)
Eosinophils Relative: 4 %
HCT: 40.1 % (ref 39.0–52.0)
Hemoglobin: 13.8 g/dL (ref 13.0–17.0)
Immature Granulocytes: 1 %
Lymphocytes Relative: 39 %
Lymphs Abs: 2.1 10*3/uL (ref 0.7–4.0)
MCH: 32.1 pg (ref 26.0–34.0)
MCHC: 34.4 g/dL (ref 30.0–36.0)
MCV: 93.3 fL (ref 80.0–100.0)
Monocytes Absolute: 0.5 10*3/uL (ref 0.1–1.0)
Monocytes Relative: 9 %
Neutro Abs: 2.5 10*3/uL (ref 1.7–7.7)
Neutrophils Relative %: 45 %
Platelets: 241 10*3/uL (ref 150–400)
RBC: 4.3 MIL/uL (ref 4.22–5.81)
RDW: 13.5 % (ref 11.5–15.5)
WBC: 5.4 10*3/uL (ref 4.0–10.5)
nRBC: 0 % (ref 0.0–0.2)

## 2022-01-29 MED ORDER — ATORVASTATIN CALCIUM 20 MG PO TABS
20.0000 mg | ORAL_TABLET | Freq: Every day | ORAL | Status: DC
  Administered 2022-01-29 – 2022-03-18 (×46): 20 mg via ORAL
  Filled 2022-01-29 (×50): qty 1

## 2022-01-29 NOTE — Progress Notes (Signed)
Patient has continued to be calm and cooperative. Patient now easily redirected if frustration levels increase. Patient does seem to get frustrated easily when having difficulty word finding or expressing ideas. Patient has been encouraged throughout the shift to "use his words" and has done so very well. Patient has verbalized understanding of new routine and behavior expectations multiple times throughout shift.  Patient denies any desire to hurt himself or others at present time and does not appear to be responding to any internal stimuli.

## 2022-01-29 NOTE — Progress Notes (Signed)
Pharmacy Consult - Clozapine     61 yo male ordered clozapine 100 mg PO QHS  This patient's order has been reviewed for prescribing contraindications.    Clozapine REMS enrollment Verified: yes on 01/04/22  REMS patient ID: [id number] Current Outpatient Monitoring: Monthly    Home Regimen:  New Start   Dose Adjustments This Admission: 2/3 started on clozapine 25mg   2/10 clozapine 100mg   2/20 clozapine 175mg  2/22 clozapine 200 mg    Labs: Date: 01/04/22 ANC: 2800   Submitted: 01/04/22 Date: 01/14/22 ANC: 2700   Submitted: 01/14/22 Date: 01/22/22 ANC: 4000   Submitted: 01/22/22 Date: 01/29/22 ANC: 2500   Submitted: 01/29/22      Plan: Continue clozapine with weekly monitoring Continue clozapine 200mg  QHS Monitor ANC at least weekly while inpatient   01/24/22, PharmD Clinical Pharmacist  01/29/2022 8:19 AM

## 2022-01-29 NOTE — Progress Notes (Signed)
Patient watching movie on computer during designated movie time. Patient took afternoon medications without difficulty.

## 2022-01-29 NOTE — Progress Notes (Signed)
Patient both yelling out and acting out since moving to new room in 1C. Patient asked repeatedly to have his Invega injection given. Patient made aware Hinda Glatter was due to be given tomorrow. Patient verbalized understanding. Patient pointed to pain scale numbers on wall at level 6. Patient asked if he wanted something for pain. Patient said yes. Patient brought both tylenol for pain and oral ativan for anxiety.  Patient refused to take oral medication, both requested tylenol and ativan. Patient given multiple chances to take oral medication. Explained to patient in simple terms that if oral ativan was not taken, an IM alternative would be administered. Patient continued to refuse and IM haldol administered with security present.  Patient re-educated on need to take oral medication to avoid IM injections in the future to which patient replied "take pills." Then patient stated "tylenol." Patient asked if he would now like to take tylenol to which he replied "yes, tylenol." Patient medicated per request.

## 2022-01-29 NOTE — TOC Progression Note (Addendum)
Transition of Care [facility]) - Progression Note    Patient Details  Name: Adam [name] MRN: [id number] Date of Birth: September 23, 1961  [facility] CM/SW Contact  Margarito Liner, LCSW Phone Number: 01/29/2022, 9:08 AM  Clinical Narrative:  Received call back from Ezequiel Ganser with [facility]. He will reach out to referral processors in [place] regarding appeal.   10:32 am: Tried calling Joy from DSS again but no answer. Sent secure email to DSS supervisor, Sharlyne Cai, regarding possibility of pursuing guardianship again.  1:54 am: Milinda Pointer said they are no longer following patient so mother will need to follow up with Dagoberto Reef of [place] regarding guardianship. Called mother and provided information.  Expected Discharge Plan: Group Home Barriers to Discharge: Continued Medical Work up  Expected Discharge Plan and Services Expected Discharge Plan: Group Home In-house Referral: Clinical Social Work     Living arrangements for the past 2 months: Group Home                                       Social Determinants of Health (SDOH) Interventions    Readmission Risk Interventions No flowsheet data found.

## 2022-01-29 NOTE — Progress Notes (Signed)
[facility]  - [place] at [facility]   PATIENT NAME: Adam [name]    MR#:  [id number]  DATE OF BIRTH:  [birth year]  SUBJECTIVE:   walking around the room and in the hallways.  Sitter Transferred to 1c today. Yelling earlier VITALS:  Blood pressure (!) 130/95, pulse (!) 110, temperature 98.4 F (36.9 C), resp. rate 19, height 5\' 8"  (1.727 m), weight 80 kg, SpO2 98 %.  PHYSICAL EXAMINATION:   GENERAL:  61 y.o.-year-old patient lying in the bed with no acute distress.  NEUROLOGIC: nonfocal  patient is alert unable to hold meaningful conversation  LABORATORY PANEL:  CBC Recent Labs  Lab 01/29/22 0805  WBC 5.4  HGB 13.8  HCT 40.1  PLT 241     Chemistries  Recent Labs  Lab 01/22/22 1332  NA 140  K 4.4  CL 106  CO2 25  GLUCOSE 125*  BUN 26*  CREATININE 0.95  CALCIUM 9.3  MG 2.0    Cardiac Enzymes No results for input(s): TROPONINI in the last 168 hours. RADIOLOGY:  No results found.  Assessment and Plan  61 yo WM with hx of schizophrenia and TBI, who was initially admitted to [facility] ER on July 20, 2021. He spent more than 123 days in the [facility] ED awaiting psychiatric placement.  He was initially brought to [facility] ER due to violent outbursts at his group home.  He was IVC'd initially.  He was noted to be lethargic 12/19, found to have NSTEMI.  Pt was admitted to hospitalist service.  Cardiology consulted who recommended no intervention.     Schizophrenia chronic with episodes of acute exacerbation -- continues to have intermittent bouts of violent outburst. -- Sitter in the room. -- cont present meds per Dr 1/20 (psych)  history of CVA -- continue aspirin and statins  Elevated CK suspected due to IM shots according to psych  history of CAD with non-STEMI previous admission --on aspirin, statins, metoprolol -- per cardiology medical management   cognitive and neuro- psych behavioral changes secondary to TBI -- challenging disposition  patient has been here since August 2022 -- see daily TOC notes for updates   Consults : psychiatry CODE STATUS: DNR DNI DVT Prophylaxis : ambulation Level of care: Med-Surg Status is: Inpatient  challenging discharge disposition!      TOTAL TIME TAKING CARE OF THIS PATIENT: 10 minutes.  >50% time spent on counselling and coordination of care  Note: This dictation was prepared with Dragon dictation along with smaller phrase technology. Any transcriptional errors that result from this process are unintentional.  September 2022 M.D    [facility]   CC: Primary care physician; Pcp, No

## 2022-01-29 NOTE — Progress Notes (Signed)
Patient ambulated with staff and security off unit to court yard for daily exercise. Patient was pleasant and cooperative throughout the entire time. Patient verbalized understanding that outdoor walk privileges would be dependent upon behavior.

## 2022-01-29 NOTE — Progress Notes (Signed)
Patient exited room despite sitter's attempts to redirect. Patient yelling and refusing to go back to his room. Patient attempting to get wheelchair in hallways. Patient agitated and yelling in hallway. Security called for assistance. Patient assisted back to room. Behavior expectations reviewed with patient again. Patient yelled "freedom! Freedom!" Patient educated he would be allowed to go for a walk in the hallways with security present when his behaviors were consistent with what was expected. Patient verbalized understanding although he stated nurse was "annoying."

## 2022-01-29 NOTE — TOC Progression Note (Signed)
Transition of Care [facility]) - Progression Note    Patient Details  Name: Ahmod Anelli MRN: [id number] Date of Birth: [birth year]  Transition of Care [facility]) CM/SW [place], LCSW Phone Number: 01/29/2022, 2:38 PM  Clinical Narrative:   CSW called [facility], they state they only accept pt's with IDD.    Expected Discharge Plan: Group Home Barriers to Discharge: Continued Medical Work up  Expected Discharge Plan and Services Expected Discharge Plan: Group Home In-house Referral: Clinical Social Work     Living arrangements for the past 2 months: Group Home                                       Social Determinants of Health (SDOH) Interventions    Readmission Risk Interventions No flowsheet data found.

## 2022-01-30 ENCOUNTER — Inpatient Hospital Stay: Payer: Medicare HMO

## 2022-01-30 LAB — CLOZAPINE (CLOZARIL)
Clozapine Lvl: 135 ng/mL — ABNORMAL LOW (ref 350–650)
NorClozapine: 61 ng/mL
Total(Cloz+Norcloz): 196 ng/mL

## 2022-01-30 NOTE — TOC Progression Note (Signed)
Transition of Care [facility]) - Progression Note    Patient Details  Name: Clayt Vonada MRN: [id number] Date of Birth: [birth year]  Transition of Care [facility]) CM/SW [place], LCSW Phone Number: 01/30/2022, 3:53 PM  Clinical Narrative:    Per [facility] Assistant Director Pt is on the list for neuro center, awaiting decision.   Expected Discharge Plan: Group Home Barriers to Discharge: Continued Medical Work up  Expected Discharge Plan and Services Expected Discharge Plan: Group Home In-house Referral: Clinical Social Work     Living arrangements for the past 2 months: Group Home                                       Social Determinants of Health (SDOH) Interventions    Readmission Risk Interventions No flowsheet data found.

## 2022-01-30 NOTE — Progress Notes (Signed)
Patient accidentally spilled hot chocolate on his puzzle in his room. Patient immediately began yelling out. Explained to patient that accidents happen and it is okay to be upset but acting out is still not appropriate. Patient able to be re-directed after a couple attempts.

## 2022-01-30 NOTE — Progress Notes (Signed)
Patient assisted to call mother [place].

## 2022-01-30 NOTE — Progress Notes (Signed)
Patient taken outside with staff and security for daily exercise and behavior reward walk. Patient pleasant and cooperative.

## 2022-01-30 NOTE — Progress Notes (Signed)
Patient alert and followed commands, walking around in room requesting to call his mother. Phone provided to patient. At 2100 patient accepted and took his medications. At 2200 patient was noticed to have excessive drooling and change of gait. When ambulating in room patient had to constantly grab something to prevent from falling. Patient became more aggressive and agitated yelling "April 15". Unable to talk down patient. Told patient that if he didn't stop yelling and sat down then he would not be able to go outside. Patient then layed down in bed and stopped yelling. NP K. Foust notified, no actions taken.  °

## 2022-01-30 NOTE — Progress Notes (Signed)
Patient awake and alert, ambulating in room. Patient seen at bedside by psychiatrist Dr. Weber Cooks.

## 2022-01-30 NOTE — Progress Notes (Signed)
Patient requesting assistance with ordering lunch. Sitter attempted to assist to which patient responded by yelling "No no no!" Patient reminded part of his expected behaviors included not yelling. Patient then looked at sitter and stated, "you b*tch." Patient immediately corrected. Patient reminded of respectful behaviors and interactions. Patient re-educated on loss of privileges if behavior expectations are not met. Patient verbalized understanding.

## 2022-01-30 NOTE — Progress Notes (Signed)
Patient pleasant and cooperative. Patient recognizes RN from yesterday. Agreeable to taking AM meds. Med taken without difficulty.

## 2022-01-30 NOTE — Progress Notes (Signed)
Patient given scheduled Invega injection without difficulty. Patient remains pleasant and cooperative.

## 2022-01-30 NOTE — Progress Notes (Signed)
[facility]  - [place] at [facility]   PATIENT NAME: Adam [name]    MR#:  [id number]  DATE OF BIRTH:  25-May-1961  SUBJECTIVE:   Sitter Pt has intermittent spells of agitation VITALS:  Blood pressure 95/69, pulse 71, temperature 97.8 F (36.6 C), temperature source Oral, resp. rate 17, height 5\' 8"  (1.727 m), weight 80 kg, SpO2 96 %.  PHYSICAL EXAMINATION:   GENERAL:  61 y.o.-year-old patient lying in the bed with no acute distress.  NEUROLOGIC: nonfocal  patient is alert unable to hold meaningful conversation  LABORATORY PANEL:  CBC Recent Labs  Lab 01/29/22 0805  WBC 5.4  HGB 13.8  HCT 40.1  PLT 241     Chemistries  No results for input(s): NA, K, CL, CO2, GLUCOSE, BUN, CREATININE, CALCIUM, MG, AST, ALT, ALKPHOS, BILITOT in the last 168 hours.  Invalid input(s): GFRCGP  Cardiac Enzymes No results for input(s): TROPONINI in the last 168 hours. RADIOLOGY:  No results found.  Assessment and Plan  61 yo WM with hx of schizophrenia and TBI, who was initially admitted to [facility] ER on July 20, 2021. He spent more than 123 days in the [facility] ED awaiting psychiatric placement.  He was initially brought to [facility] ER due to violent outbursts at his group home.  He was IVC'd initially.  He was noted to be lethargic 12/19, found to have NSTEMI.  Pt was admitted to hospitalist service.  Cardiology consulted who recommended no intervention.     Schizophrenia chronic with episodes of acute exacerbation -- continues to have intermittent bouts of violent outburst. -- Sitter in the room. -- cont present meds per Dr 1/20 (psych)  history of CVA -- continue aspirin and statins  Elevated CK suspected due to IM shots according to psych  history of CAD with non-STEMI previous admission --on aspirin, statins, metoprolol -- per cardiology medical management   cognitive and neuro- psych behavioral changes secondary to TBI -- challenging disposition patient has been  here since August 2022 -- see daily TOC notes for updates   Consults : psychiatry CODE STATUS: DNR DNI DVT Prophylaxis : ambulation Level of care: Med-Surg Status is: Inpatient  challenging discharge disposition!      TOTAL TIME TAKING CARE OF THIS PATIENT: 10 minutes.  >50% time spent on counselling and coordination of care  Note: This dictation was prepared with Dragon dictation along with smaller phrase technology. Any transcriptional errors that result from this process are unintentional.  September 2022 M.D    [facility]   CC: Primary care physician; Pcp, No

## 2022-01-30 NOTE — Progress Notes (Signed)
Patient given clean laundry. Patient assisted to take shower and wash his hair. Patient needed standby assistance only. Patient tolerated well with no need for minimal re-direction.

## 2022-01-30 NOTE — Progress Notes (Signed)
° °      CROSS COVER NOTE  NAME: Adam [name] MRN: [id number] DOB : November 25, 1961  Secure chat received from nursing:  Patient is excessively drooling and he is also very unsteady on his feet right now, to the point we had to make him sit down to not fall. He is getting increasingly aggitated and yelling    On my bedside evaluation patient was laying in bed asleep. Once aroused he followed commands. Unable to assess gait at this time and physical exam limited by patient participation and his baseline mental status. From the physical exam completed no focal neurological deficits appreciated. Out of an abundance of caution and patient's inability to participate in a ROS will get a head CT.  For agitation and aggression recommended nursing utilize any of the multiple PRN agents already ordered.  Bishop Limbo MHA, MSN, FNP-BC Nurse Practitioner [facility] Pager [phone]

## 2022-01-30 NOTE — Progress Notes (Signed)
Patient sitter attempted to open blinds in room to allow for sunlight, patient immediately began yelling "No, no, no!" Sitter shut blinds and patient resumed being calm.

## 2022-01-30 NOTE — Progress Notes (Signed)
2330: Attempted to transport patient to CT. IM Ativan given prior to transport. When security and transport came to assist patient, patient stated he had to go to the bathroom. Patient then seen just sitting on toilet fully clothed and yelling no when asked if he could go back to bed. Attempted to de escalate patient, asked patient what he was feeling, why he was saying no with no success. Then told patient that if he did not go back to bed and stop yelling, that he would no be able to go outside, patient continued to yell and refused to go back to bed. After several attempts, patient then moved to bedside recliner and refused to go to bed. Transport then canceled, NP K. Foust notified. Patient currently sitting in recliner calmly.

## 2022-01-30 NOTE — Progress Notes (Signed)
Patient resting in bed quietly. Patient introduced to day shift care team. Patient praised for following expected behaviors overnight and encouraged to continue those throughout the day today.

## 2022-01-31 DIAGNOSIS — F209 Schizophrenia, unspecified: Secondary | ICD-10-CM | POA: Diagnosis not present

## 2022-01-31 NOTE — TOC Progression Note (Signed)
Transition of Care (TOC) - Progression Note  ? ? ?Patient Details  ?Name: Adam [name] ?MRN: [id number] ?Date of Birth: [birth year] ? ?Transition of Care (TOC) CM/SW Contact  ?Amarian Botero A Kiwanna Spraker, LCSW ?Phone Number: ?01/31/2022, 11:17 AM ? ?Clinical Narrative:   Confirmed with [facility] Department Assistant Director that pt is on the waitlist at [facility] [place], and [facility]. ? ? ? ?Expected Discharge Plan: Group Home ?Barriers to Discharge: Continued Medical Work up ? ?Expected Discharge Plan and Services ?Expected Discharge Plan: Group Home ?In-house Referral: Clinical Social Work ?  ?  ?Living arrangements for the past 2 months: Group Home ?                ?  ?  ?  ?  ?  ?  ?  ?  ?  ?  ? ? ?Social Determinants of Health (SDOH) Interventions ?  ? ?Readmission Risk Interventions ?No flowsheet data found. ? ?

## 2022-01-31 NOTE — TOC Progression Note (Signed)
Transition of Care (TOC) - Progression Note  ? ? ?Patient Details  ?Name: Adam [name] ?MRN: [id number] ?Date of Birth: 22-Aug-1961 ? ?Transition of Care (TOC) CM/SW Contact  ?[place] Cellar, RN ?Phone Number: ?01/31/2022, 4:46 PM ? ?Clinical Narrative:    ?LVMM for Program manager Love Bruener [phone] requesting update on wait list for [facility] [place] and [facility] as it was confirmed patient remains on wait list. Seeking potential time line or any information needed for review.  ? ? ?Expected Discharge Plan: Group Home ?Barriers to Discharge: Continued Medical Work up ? ?Expected Discharge Plan and Services ?Expected Discharge Plan: Group Home ?In-house Referral: Clinical Social Work ?  ?  ?Living arrangements for the past 2 months: Group Home ?                ?  ?  ?  ?  ?  ?  ?  ?  ?  ?  ? ? ?Social Determinants of Health (SDOH) Interventions ?  ? ?Readmission Risk Interventions ?No flowsheet data found. ? ?

## 2022-01-31 NOTE — Progress Notes (Signed)
?PROGRESS NOTE ? ?Adam [name]  DVV:[id number] DOB: [birth year] DOA: 07/20/2021 ?PCP: Pcp, No  ? ?Brief Narrative: ?61 yo WM with hx of schizophrenia and TBI, who was initially admitted to [name] ER on July 20, 2021. He spent more than 123 days in the [name] ED awaiting psychiatric placement.  He was initially brought to [name] ER due to violent outbursts at his group home.  He was IVC'd initially.  He was noted to be lethargic 12/19, found to have NSTEMI.  Pt was admitted to hospitalist service.  Cardiology consulted who recommended no intervention.   ? After admission to the medical service patient became severely agitated on the floor.  Pt required to ICU night of 12/24 for precedex gtt due to severe agitation and combative behavior.  PCCM was consulted.  Precedex felt to be ineffective and was turned off.  Pt was transferred back to floor on 1/4.  Hospital course remarkable for agitation, violent outbursts ?Psychiatry was consulted,making some medication changes.  They do not accept pt to inpatient psych here and recommend continued search for longer term placement.  Senior management involved.   ? ?  ?   ? ?Assessment & Plan: ? ?Principal Problem: ?  Schizophrenia, chronic condition with acute exacerbation (HCC) ?Active Problems: ?  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  Myocardial infarction [name]) ?  DNR (do not resuscitate)/DNI(Do Not Intubate) ?  NSTEMI (non-ST elevated myocardial infarction) (HCC) ?  Acute metabolic encephalopathy ?  TBI (traumatic brain injury) ?  Stroke [name]) ?  Elevated CK ? ? ?Assessment and Plan: ?* Schizophrenia, chronic condition with acute exacerbation (HCC)- (present on admission) ? ?Psychiatry was following him here due to ongoing safety issues related to patient's behavioral disturbances.  ?Continue medication as per psychiatric recommendation.  As per them, he does not need inpatient psychiatric admission ? ?Elevated CK ?Elevated CK most likely due to  IM injections as per psych, less likely NMS,no changes in the current regimen ? ? ?Stroke [name]) ?Hx of stroke.  Seems to have significant aphasia, otherwise no apparent deficits. Monitor.  On ASA, statin. ? ?Acute metabolic encephalopathy ?Currently at baseline. ?Head CT 12/19 with chronic changes of L frontal and parietal infarcts with encephalomalacia and mild volume loss. ?Delirium precautions ?Psych medications per psych's orders ? ? ?NSTEMI (non-ST elevated myocardial infarction) (HCC)- (present on admission) ?Previously this admission. ?Now Stable.   ? ?DNR (do not resuscitate)/DNI(Do Not Intubate) ?EDP verified pt's DNR/DNI status with his mother(Adam [name]) ? ?Myocardial infarction [name]) ?NSTEMI with elevated troponin (peaked to 23273), EKG with Q waves.Treated q 48 hrs heparin gtt ?Aspirin, statin, metop as BP allows ?Poor invasive candidate with psych hx  ?Stable, no recent chest pain. ? 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 Needs placement. ?Challenging disposition; pt here since August. ?TOC and leadership involved in challenging disposition.  TOC following ? ?Acute pulmonary edema (HCC)-resolved as of 01/31/2022 ?CXR with mild cardiomegaly with diffuse interstitial opacities suggesting pulm edema - atypical/viral infection not excluded ?Negative influenza/covid ?Most likely HF with NSTEMI ? ?Resolved. On RA. ? ? ? ? ?  ? ? ?  ?  ? ?DVT prophylaxis:SCDs Start: 11/20/21 2032 ? ? ?  Code Status: DNR ? ?Family Communication: None at bedside ? ?Patient status:Inpatient ? ?Patient is from :Home ? ?Anticipated discharge to:Not sure.  Prolonged hospitalization.  TOC following ? ?Estimated DC date: Unsure ? ? ?Consultants: Psychiatry, cardiology ? ?Procedures: None ? ?Antimicrobials:  ?Anti-infectives (From admission, onward)  ? ?  None  ? ?  ? ? ?Subjective: ? ?Patient seen and examined at the bedside this morning.  He was sitting in the chair and walking around  inside the room.  Not in any kind of distress.  Alert, awake, answers questions but confused.  Sitter at the bedside ? ?Objective: ?Vitals:  ? 01/30/22 1100 01/30/22 2106 01/31/22 0911 01/31/22 1130  ?BP: 95/69 (!) 148/49 121/87 108/78  ?Pulse: 71 96 88 88  ?Resp: 17  17 17   ?Temp: 97.8 ?F (36.6 ?C) 98.3 ?F (36.8 ?C) 97.9 ?F (36.6 ?C) 97.8 ?F (36.6 ?C)  ?TempSrc: Oral Oral Oral Oral  ?SpO2: 96% 98% 100% 97%  ?Weight:      ?Height:      ? ? ?Intake/Output Summary (Last 24 hours) at 01/31/2022 1136 ?Last data filed at 01/30/2022 1943 ?Gross per 24 hour  ?Intake --  ?Output 300 ml  ?Net -300 ml  ? ?Filed Weights  ? 07/20/21 1635 12/07/21 2025  ?Weight: 79.4 kg 80 kg  ? ? ?Examination: ? ?General exam: Overall comfortable, not in distress ?HEENT: PERRL ?Respiratory system:  no wheezes or crackles  ?Cardiovascular system: S1 & S2 heard, RRR.  ?Gastrointestinal system: Abdomen is nondistended, soft and nontender. ?Central nervous system: Alert and awake but not oriented ?Extremities: No edema, no clubbing ,no cyanosis ?Skin: No rashes, no ulcers,no icterus   ? ? ?Data Reviewed: I have personally reviewed following labs and imaging studies ? ?CBC: ?Recent Labs  ?Lab 01/29/22 ?0805  ?WBC 5.4  ?NEUTROABS 2.5  ?HGB 13.8  ?HCT 40.1  ?MCV 93.3  ?PLT 241  ? ?Basic Metabolic Panel: ?No results for input(s): NA, K, CL, CO2, GLUCOSE, BUN, CREATININE, CALCIUM, MG, PHOS in the last 168 hours. ? ? ?No results found for this or any previous visit (from the past 240 hour(s)).  ? ?Radiology Studies: ?No results found. ? ?Scheduled Meds: ? aspirin  81 mg Oral Daily  ? atorvastatin  20 mg Oral Daily  ? benztropine  0.5 mg Oral BID  ? clonazePAM  1 mg Oral TID  ? cloZAPine  200 mg Oral QHS  ? docusate sodium  100 mg Oral BID  ? gabapentin  300 mg Oral TID  ? haloperidol  5 mg Oral TID  ? mouth rinse  15 mL Mouth Rinse BID  ? metoprolol succinate  25 mg Oral Daily  ? multivitamin with minerals  1 tablet Oral Daily  ? paliperidone  234 mg  Intramuscular Q28 days  ? polyethylene glycol  17 g Oral BID  ? temazepam  15 mg Oral QHS  ? ?Continuous Infusions: ? ? LOS: 72 days  ? ?01/31/22, MD ?[facility] ?P3/12/2021, 11:36 AM   ?

## 2022-02-01 ENCOUNTER — Inpatient Hospital Stay: Payer: Medicare HMO

## 2022-02-01 DIAGNOSIS — F209 Schizophrenia, unspecified: Secondary | ICD-10-CM | POA: Diagnosis not present

## 2022-02-01 NOTE — Progress Notes (Signed)
?PROGRESS NOTE ? ?Adam [name]  AJO:[id number] DOB: 16-Mar-1961 DOA: 07/20/2021 ?PCP: Pcp, No  ? ?Brief Narrative: ?61 yo WM with hx of schizophrenia and TBI, who was initially admitted to [facility] ER on July 20, 2021. He spent more than 123 days in the [facility] ED awaiting psychiatric placement.  He was initially brought to [facility] ER due to violent outbursts at his group home.  He was IVC'd initially.  He was noted to be lethargic 12/19, found to have NSTEMI.  Pt was admitted to hospitalist service.  Cardiology consulted who recommended no intervention.   ? After admission to the medical service patient became severely agitated on the floor.  Pt required to ICU night of 12/24 for precedex gtt due to severe agitation and combative behavior.  PCCM was consulted.  Precedex felt to be ineffective and was turned off.  Pt was transferred back to floor on 1/4.  Hospital course remarkable for agitation, violent outbursts ?Psychiatry was consulted,making some medication changes.  They do not accept pt to inpatient psych here and recommend continued search for longer term placement.  Senior management involved.  Plan for placement on a group home. ? ?  ?   ? ?Assessment & Plan: ? ?Principal Problem: ?  Schizophrenia, chronic condition with acute exacerbation (HCC) ?Active Problems: ?  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  Myocardial infarction [facility]) ?  DNR (do not resuscitate)/DNI(Do Not Intubate) ?  NSTEMI (non-ST elevated myocardial infarction) (HCC) ?  Acute metabolic encephalopathy ?  TBI (traumatic brain injury) ?  Stroke [facility]) ?  Elevated CK ? ? ?Assessment and Plan: ?* Schizophrenia, chronic condition with acute exacerbation (HCC)- (present on admission) ? ?Psychiatry was following him here due to ongoing safety issues related to patient's behavioral disturbances.  ?Continue medication as per psychiatric recommendation.  As per them, he does not need inpatient psychiatric admission ? ?Elevated  CK ?Elevated CK most likely due to IM injections as per psych, less likely NMS,no changes in the current regimen ? ? ?Stroke [facility]) ?Hx of stroke.  Seems to have significant aphasia, otherwise no apparent deficits. Monitor.  On ASA, statin. ? ?Acute metabolic encephalopathy ?Currently at baseline. ?Head CT 12/19 with chronic changes of L frontal and parietal infarcts with encephalomalacia and mild volume loss. ?Delirium precautions ?Psych medications per psych's orders ? ? ?NSTEMI (non-ST elevated myocardial infarction) (HCC)- (present on admission) ?Previously this admission. ?Now Stable.   ? ?DNR (do not resuscitate)/DNI(Do Not Intubate) ?EDP verified pt's DNR/DNI status with his mother([name]) ? ?Myocardial infarction [facility]) ?NSTEMI with elevated troponin (peaked to 23273), EKG with Q waves.Treated q 48 hrs heparin gtt ?Aspirin, statin, metop as BP allows ?Poor invasive candidate with psych hx  ?Stable, no recent chest pain. ? 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 Needs placement. ?Challenging disposition; pt here since August. ?TOC and leadership involved in challenging disposition.  TOC following ? ?Acute pulmonary edema (HCC)-resolved as of 01/31/2022 ?CXR with mild cardiomegaly with diffuse interstitial opacities suggesting pulm edema - atypical/viral infection not excluded ?Negative influenza/covid ?Most likely HF with NSTEMI ? ?Resolved. On RA. ? ? ? ? ?  ? ? ?  ?  ? ?DVT prophylaxis:SCDs Start: 11/20/21 2032 ? ? ?  Code Status: DNR ? ?Family Communication: None at bedside ? ?Patient status:Inpatient ? ?Patient is from :Home ? ?Anticipated discharge NO:[facility] home.  Prolonged hospitalization.  TOC following ? ?Estimated DC date: Unsure ? ? ?Consultants: Psychiatry, cardiology ? ?Procedures: None ? ?Antimicrobials:  ?  Anti-infectives (From admission, onward)  ? ? None  ? ?  ? ? ?Subjective: ? ?Seen at the bedside.  He was in the bathroom.  Sitter at the bedside.   No change from yesterday.  Comfortable ? ?Objective: ?Vitals:  ? 01/31/22 0911 01/31/22 1130 01/31/22 1707 01/31/22 1914  ?BP: 121/87 108/78 111/74 100/76  ?Pulse: 88 88 88 98  ?Resp: 17 17 17 17   ?Temp: 97.9 ?F (36.6 ?C) 97.8 ?F (36.6 ?C) 97.9 ?F (36.6 ?C) 97.8 ?F (36.6 ?C)  ?TempSrc: Oral Oral Oral Oral  ?SpO2: 100% 97% 98% 98%  ?Weight:      ?Height:      ? ?No intake or output data in the 24 hours ending 02/01/22 0752 ? ?Filed Weights  ? 07/20/21 1635 12/07/21 2025  ?Weight: 79.4 kg 80 kg  ? ? ?Examination: ? ?General exam: Overall comfortable, not in distress, alert and awake but not oriented ? ? ?Data Reviewed: I have personally reviewed following labs and imaging studies ? ?CBC: ?Recent Labs  ?Lab 01/29/22 ?0805  ?WBC 5.4  ?NEUTROABS 2.5  ?HGB 13.8  ?HCT 40.1  ?MCV 93.3  ?PLT 241  ? ?Basic Metabolic Panel: ?No results for input(s): NA, K, CL, CO2, GLUCOSE, BUN, CREATININE, CALCIUM, MG, PHOS in the last 168 hours. ? ? ?No results found for this or any previous visit (from the past 240 hour(s)).  ? ?Radiology Studies: ?No results found. ? ?Scheduled Meds: ? aspirin  81 mg Oral Daily  ? atorvastatin  20 mg Oral Daily  ? benztropine  0.5 mg Oral BID  ? clonazePAM  1 mg Oral TID  ? cloZAPine  200 mg Oral QHS  ? docusate sodium  100 mg Oral BID  ? gabapentin  300 mg Oral TID  ? haloperidol  5 mg Oral TID  ? mouth rinse  15 mL Mouth Rinse BID  ? metoprolol succinate  25 mg Oral Daily  ? multivitamin with minerals  1 tablet Oral Daily  ? paliperidone  234 mg Intramuscular Q28 days  ? polyethylene glycol  17 g Oral BID  ? temazepam  15 mg Oral QHS  ? ?Continuous Infusions: ? ? LOS: 73 days  ? ?01/31/22, MD ?[facility] ?P3/01/2022, 7:52 AM   ?

## 2022-02-01 NOTE — Progress Notes (Signed)
Patient has maintained behavior expectations with need for only minimal re-direction. Patient taken for daily exercise/behavior reward walk outside with staff and security. Patient pleasant and cooperative. No issues noted.  ?

## 2022-02-01 NOTE — Progress Notes (Signed)
Patient pleasant and cooperative. Took afternoon medications without issue. ?

## 2022-02-01 NOTE — Progress Notes (Signed)
Patient calm and cooperative. Took all AM medications without difficulty. Working on puzzle in room with sitter at bedside.  ?

## 2022-02-01 NOTE — Progress Notes (Signed)
Patient received breakfast tray. Upon opening seeing tray he immediately began yelling. Patient reminded of behavior expectations and that yelling was not an appropriate behavior. Patient encouraged to verbalize what it was he wanted and/or was upset about. Patient requested hot chocolate. As RN was leaving room to get hot chocolate and utensils, patient yelled loudly "Get it NOW!" Immediately re-entered room and acknowledged behavior. Patient made aware he would not be getting hot chocolate right now as yelling at staff was not appropriate. Patient verbalized understanding.  ?

## 2022-02-01 NOTE — Progress Notes (Signed)
Attempted to engaged patient during bedside report/shift change, patient ignoring staff. Patient refused to make eye contact or even look up from his phone.  ?

## 2022-02-01 NOTE — Progress Notes (Signed)
Received call from CT asking if patient was appropriate and/or agreeable to come for Head CT that was ordered previously. Patient agreeable. Patient ambulated with staff and security to CT. Pleasant and cooperative.  ?

## 2022-02-01 NOTE — Progress Notes (Signed)
Patient was not physically aggressive during shift and was able to be re-directed when he became frustrated or began to escalate his behaviors.  ?Use of words instead of pointing and/or grunting increasing.  ?Immediate and delayed echolalia continued, as well as perseverating on phrases.  ?Patient has requested to be referred to as "Adam [name]" today. Patient's wishes about this have been respected throughout the shift.  ?

## 2022-02-02 DIAGNOSIS — F209 Schizophrenia, unspecified: Secondary | ICD-10-CM | POA: Diagnosis not present

## 2022-02-02 NOTE — Progress Notes (Signed)
Pt anxious and agitated but redirectable when re-approached. Noted that patient hides pills in his cheeks and then attempts to spit them out. Patient was not combative to this Clinical research associate or the 1:1 sitter.  ?

## 2022-02-02 NOTE — Progress Notes (Signed)
?PROGRESS NOTE ? ?Adam [name]  DEY:[id number] DOB: 11/21/1961 DOA: 07/20/2021 ?PCP: Pcp, No  ? ?Brief Narrative: ?61 yo WM with hx of schizophrenia and TBI, who was initially admitted to [facility] ER on July 20, 2021. He spent more than 123 days in the [facility] ED awaiting psychiatric placement.  He was initially brought to [facility] ER due to violent outbursts at his group home.  He was IVC'd initially.  He was noted to be lethargic 12/19, found to have NSTEMI.  Pt was admitted to hospitalist service.  Cardiology consulted who recommended no intervention.   ? After admission to the medical service patient became severely agitated on the floor.  Pt required to ICU night of 12/24 for precedex gtt due to severe agitation and combative behavior.  PCCM was consulted.  Precedex felt to be ineffective and was turned off.  Pt was transferred back to floor on 1/4.  Hospital course remarkable for agitation, violent outbursts ?Psychiatry was consulted,making some medication changes.  They do not accept pt to inpatient psych here and recommend continued search for longer term placement.  Senior management involved.  Plan for placement on a group home. ? ?  ?   ? ?Assessment & Plan: ? ?Principal Problem: ?  Schizophrenia, chronic condition with acute exacerbation (HCC) ?Active Problems: ?  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  Myocardial infarction [facility]) ?  DNR (do not resuscitate)/DNI(Do Not Intubate) ?  NSTEMI (non-ST elevated myocardial infarction) (HCC) ?  Acute metabolic encephalopathy ?  TBI (traumatic brain injury) ?  Stroke [facility]) ?  Elevated CK ? ? ?Assessment and Plan: ?* Schizophrenia, chronic condition with acute exacerbation (HCC) ? ?Psychiatry was following him here due to ongoing safety issues related to patient's behavioral disturbances.  ?Continue medication as per psychiatric recommendation.  As per them, he does not need inpatient psychiatric admission ? ?Elevated CK ?Elevated CK most  likely due to IM injections as per psych, less likely NMS,no changes in the current regimen ? ? ?Stroke [facility]) ?Hx of stroke.  Seems to have significant aphasia, otherwise no apparent deficits. Monitor.  On ASA, statin. ? ?Acute metabolic encephalopathy ?Currently at baseline. ?Head  CT f/u doesnot show any acute findings ?Delirium precautions ?Psych medications per psych's orders ? ? ?NSTEMI (non-ST elevated myocardial infarction) (HCC) ?Previously this admission. ?Now Stable.   ? ?DNR (do not resuscitate)/DNI(Do Not Intubate) ?EDP verified pt's DNR/DNI status with his mother(Adam [name]) ? ?Myocardial infarction [facility]) ?NSTEMI with elevated troponin (peaked to 23273), EKG with Q waves.Treated q 48 hrs heparin gtt ?Aspirin, statin, metop as BP allows ?Poor invasive candidate with psych hx  ?Stable, no recent chest pain. ? 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 Needs placement. ?Challenging disposition; pt here since August. ?TOC and leadership involved in challenging disposition.  TOC following ? ?Acute pulmonary edema (HCC)-resolved as of 01/31/2022 ?CXR with mild cardiomegaly with diffuse interstitial opacities suggesting pulm edema - atypical/viral infection not excluded ?Negative influenza/covid ?Most likely HF with NSTEMI ? ?Resolved. On RA. ? ? ? ? ?  ? ? ?  ?  ? ?DVT prophylaxis:SCDs Start: 11/20/21 2032 ? ? ?  Code Status: DNR ? ?Family Communication: None at bedside ? ?Patient status:Inpatient ? ?Patient is from :Home ? ?Anticipated discharge DJ:[facility] home.  Prolonged hospitalization.  TOC following ? ?Estimated DC date: Unsure ? ? ?Consultants: Psychiatry, cardiology ? ?Procedures: None ? ?Antimicrobials:  ?Anti-infectives (From admission, onward)  ? ? None  ? ?  ? ? ?  Subjective: ? ?Patient seen at the bedside today.  Sitter at the bedside.  He was comfortably sleeping.  I did not disturb him ? ? ?Objective: ?Vitals:  ? 01/31/22 1707 01/31/22 1914 02/01/22 2042  02/02/22 0523  ?BP: 111/74 100/76 123/88 118/80  ?Pulse: 88 98 91 84  ?Resp: 17 17 18 16   ?Temp: 97.9 ?F (36.6 ?C) 97.8 ?F (36.6 ?C) 98.2 ?F (36.8 ?C) 97.7 ?F (36.5 ?C)  ?TempSrc: Oral Oral Oral Oral  ?SpO2: 98% 98% 97% 100%  ?Weight:      ?Height:      ? ?No intake or output data in the 24 hours ending 02/02/22 1130 ? ?Filed Weights  ? 07/20/21 1635 12/07/21 2025  ?Weight: 79.4 kg 80 kg  ? ? ?Examination: ? ?General exam: Overall comfortable, not in distress, sleeping ? ?Data Reviewed: I have personally reviewed following labs and imaging studies ? ?CBC: ?Recent Labs  ?Lab 01/29/22 ?0805  ?WBC 5.4  ?NEUTROABS 2.5  ?HGB 13.8  ?HCT 40.1  ?MCV 93.3  ?PLT 241  ? ?Basic Metabolic Panel: ?No results for input(s): NA, K, CL, CO2, GLUCOSE, BUN, CREATININE, CALCIUM, MG, PHOS in the last 168 hours. ? ? ?No results found for this or any previous visit (from the past 240 hour(s)).  ? ?Radiology Studies: ?CT HEAD WO CONTRAST (01/31/22) ? ?Result Date: 02/01/2022 ?CLINICAL DATA:  Schizophrenia, traumatic brain injury, altered level of consciousness EXAM: CT HEAD WITHOUT CONTRAST TECHNIQUE: Contiguous axial images were obtained from the base of the skull through the vertex without intravenous contrast. RADIATION DOSE REDUCTION: This exam was performed according to the departmental dose-optimization program which includes automated exposure control, adjustment of the mA and/or kV according to patient size and/or use of iterative reconstruction technique. COMPARISON:  12/07/2021 FINDINGS: Brain: Chronic areas of encephalomalacia throughout the left cerebral hemisphere unchanged. No acute infarct or hemorrhage. Lateral ventricles and midline structures are unremarkable. No acute extra-axial fluid collections. No mass effect. Vascular: No hyperdense vessel or unexpected calcification. Skull: Normal. Negative for fracture or focal lesion. Sinuses/Orbits: No acute finding. Other: None. IMPRESSION: 1. Stable head CT, no acute intracranial  process. Electronically Signed   By: 02/04/2022 M.D.   On: 02/01/2022 17:58   ? ?Scheduled Meds: ? aspirin  81 mg Oral Daily  ? atorvastatin  20 mg Oral Daily  ? benztropine  0.5 mg Oral BID  ? clonazePAM  1 mg Oral TID  ? cloZAPine  200 mg Oral QHS  ? docusate sodium  100 mg Oral BID  ? gabapentin  300 mg Oral TID  ? haloperidol  5 mg Oral TID  ? metoprolol succinate  25 mg Oral Daily  ? multivitamin with minerals  1 tablet Oral Daily  ? paliperidone  234 mg Intramuscular Q28 days  ? polyethylene glycol  17 g Oral BID  ? temazepam  15 mg Oral QHS  ? ?Continuous Infusions: ? ? LOS: 74 days  ? ?04/03/2022, MD ?[facility] ?P3/01/2022, 11:30 AM   ?

## 2022-02-03 DIAGNOSIS — F209 Schizophrenia, unspecified: Secondary | ICD-10-CM | POA: Diagnosis not present

## 2022-02-03 NOTE — Progress Notes (Signed)
?  Progress Note ? ?Patient: Adam [name] [id number] DOB: 1961-04-11  ?DOA: 07/20/2021  DOS: 02/03/2022  ?  ?Brief hospital course: ?61 yo WM with hx of schizophrenia and TBI, who was initially admitted to [facility] ER on July 20, 2021. He spent more than 123 days in the [facility] ED awaiting psychiatric placement.  He was initially brought to [facility] ER due to violent outbursts at his group home.  He was IVC'd initially.  He was noted to be lethargic 12/19, found to have NSTEMI.  Pt was admitted to hospitalist service.  Cardiology consulted who recommended no intervention.   ?After admission to the medical service patient became severely agitated on the floor.  Pt required to ICU night of 12/24 for precedex gtt due to severe agitation and combative behavior.  PCCM was consulted.  Precedex felt to be ineffective and was turned off.  Pt was transferred back to floor on 1/4.  Hospital course remarkable for agitation, violent outbursts ?Psychiatry was consulted,making some medication changes.  They do not accept pt to inpatient psych here and recommend continued search for longer term placement.  Senior management involved.  Plan for placement on a group home. ? ?Assessment and Plan: ?* Schizophrenia, chronic condition with acute exacerbation ([place]) ?Psychiatry was following him here due to ongoing safety issues related to patient's behavioral disturbances.  ?- Continue medication as per psychiatric recommendation ?- No inpatient psychiatric admission recommended. ? ?Elevated CK ?Elevated CK most likely due to IM injections as per psych, less likely NMS.  ?- No changes in the current regimen ? ? ?Stroke [facility]) ?Hx of stroke.  Seems to have significant aphasia, otherwise no apparent deficits. ?- Continue ASA, statin. ? ?Acute metabolic encephalopathy ?Currently at baseline. Head  CT f/u doesnot show any acute findings ?- Delirium precautions ?- Psych medications per psychiatry's orders ? ? ?NSTEMI (non-ST elevated myocardial  infarction) ([place]) ?Previously this admission. ?Now Stable.   ? ?DNR (do not resuscitate)/DNI(Do Not Intubate) ?EDP verified pt's DNR/DNI status with his mother ([place] Siegel) ? ?Myocardial infarction [facility]) ?NSTEMI with elevated troponin (peaked to 23273), EKG with Q waves.Treated q 48 hrs heparin gtt ?- Continue aspirin, atorvastatin, metoprolol ?- Invasive evaluation deferred per cardiology.  ?- Monitoring for anginal signs or symptoms which are currently resolved. ? 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  ?- Pursuing placement which is complicated. TOC and leadership involved in challenging disposition. ? ?Acute pulmonary edema (HCC)-resolved as of 01/31/2022 ?CXR with mild cardiomegaly with diffuse interstitial opacities suggesting pulm edema - atypical/viral infection not excluded. Negative influenza/covid. This was likely due to NSTEMI. ?- Resolved. ? ?Subjective: No complaints ? ?Objective: ?BP 121/74 (BP Location: Right Arm)   Pulse 80   Temp 97.6 ?F (36.4 ?C) (Oral)   Resp 19   Ht 5\' 8"  (1.727 m)   Wt 80 kg   SpO2 96%   BMI 26.82 kg/m?   ?Gen: Chronically ill-appearing 61 y.o. male in no distress ?Pulm: Nonlabored breathing room air. ?CV: No JVD, no dependent edema. ?Neuro: Resting quietly ? ?CT head 3/2 showed no acute intracranial process.  ? ?Family Communication: None at bedside ? ?Disposition: ?Status is: Inpatient ?Remains inpatient appropriate because: Unsafe disposition.  ? ?Patrecia Pour, MD ?02/03/2022 9:07 AM ?Page by [email or web address]  ?

## 2022-02-04 DIAGNOSIS — F209 Schizophrenia, unspecified: Secondary | ICD-10-CM | POA: Diagnosis not present

## 2022-02-04 NOTE — Progress Notes (Signed)
?  Progress Note ? ?Patient: Adam [name] PQZ:[id number] DOB: 11-Mar-1961  ?DOA: 07/20/2021  DOS: 02/04/2022  ?  ?Brief hospital course: ?61 yo WM with hx of schizophrenia and TBI, who was initially admitted to [facility] ER on July 20, 2021. He spent more than 123 days in the [facility] ED awaiting psychiatric placement.  He was initially brought to [facility] ER due to violent outbursts at his group home.  He was IVC'd initially.  He was noted to be lethargic 12/19, found to have NSTEMI.  Pt was admitted to hospitalist service.  Cardiology consulted who recommended no intervention.   ?After admission to the medical service patient became severely agitated on the floor.  Pt required to ICU night of 12/24 for precedex gtt due to severe agitation and combative behavior.  PCCM was consulted.  Precedex felt to be ineffective and was turned off.  Pt was transferred back to floor on 1/4.  Hospital course remarkable for agitation, violent outbursts ?Psychiatry was consulted,making some medication changes.  They do not accept pt to inpatient psych here and recommend continued search for longer term placement.  Senior management involved.  Plan for placement on a group home. ? ?Assessment and Plan: ?* Schizophrenia, chronic condition with acute exacerbation (HCC) ?Psychiatry was following him here due to ongoing safety issues related to patient's behavioral disturbances.  ?- Continue medication as per psychiatric recommendation ?- No inpatient psychiatric admission recommended. ? ?Elevated CK ?Elevated CK most likely due to IM injections as per psych, less likely NMS.  ?- No changes in the current regimen ? ? ?Stroke [facility]) ?Hx of stroke.  Seems to have significant aphasia, otherwise no apparent deficits. ?- Continue ASA, statin. ? ?Acute metabolic encephalopathy ?Currently at baseline. Head  CT f/u doesnot show any acute findings ?- Delirium precautions ?- Psych medications per psychiatry's orders ? ? ?NSTEMI (non-ST elevated myocardial  infarction) (HCC) ?Previously this admission. ?Now Stable.   ? ?DNR (do not resuscitate)/DNI(Do Not Intubate) ?EDP verified pt's DNR/DNI status with his mother (Adam [name]) ? ?Myocardial infarction [facility]) ?NSTEMI with elevated troponin (peaked to 23273), EKG with Q waves.Treated q 48 hrs heparin gtt ?- Continue aspirin, atorvastatin, metoprolol ?- Invasive evaluation deferred per cardiology.  ?- Monitoring for anginal signs or symptoms which are currently resolved. ? 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  ?- Pursuing placement which is complicated. TOC and leadership involved in challenging disposition. ? ?Acute pulmonary edema (HCC)-resolved as of 01/31/2022 ?CXR with mild cardiomegaly with diffuse interstitial opacities suggesting pulm edema - atypical/viral infection not excluded. Negative influenza/covid. This was likely due to NSTEMI. ?- Resolved. ? ?Subjective: Did not verbalize in response to questions, no events noted per staff. ? ?Objective: ?BP 125/75   Pulse 91   Temp (!) 97.5 ?F (36.4 ?C) (Oral)   Resp 16   Ht 5\' 8"  (1.727 m)   Wt 80 kg   SpO2 97%   BMI 26.82 kg/m?   ?Gen: Chronically ill-appearing male working on a puzzle.  ?Pulm: Nonlabored ? ?CT head 3/2 showed no acute intracranial process.  ? ?Family Communication: None at bedside ? ?Disposition: ?Status is: Inpatient ?Remains inpatient appropriate because: Unsafe disposition.  ? ? , MD ?02/04/2022 11:29 AM ?Page by [email or web address]  ?

## 2022-02-04 NOTE — Progress Notes (Incomplete)
Security to bedside pt having violet outburst will not stay in room pt screaming in hallways. PRN Haloperidol giving IM.  ?

## 2022-02-05 DIAGNOSIS — F209 Schizophrenia, unspecified: Secondary | ICD-10-CM | POA: Diagnosis not present

## 2022-02-05 LAB — CBC WITH DIFFERENTIAL/PLATELET
Abs Immature Granulocytes: 0.03 10*3/uL (ref 0.00–0.07)
Basophils Absolute: 0.1 10*3/uL (ref 0.0–0.1)
Basophils Relative: 1 %
Eosinophils Absolute: 0.2 10*3/uL (ref 0.0–0.5)
Eosinophils Relative: 4 %
HCT: 41.5 % (ref 39.0–52.0)
Hemoglobin: 14 g/dL (ref 13.0–17.0)
Immature Granulocytes: 1 %
Lymphocytes Relative: 36 %
Lymphs Abs: 1.7 10*3/uL (ref 0.7–4.0)
MCH: 31.3 pg (ref 26.0–34.0)
MCHC: 33.7 g/dL (ref 30.0–36.0)
MCV: 92.6 fL (ref 80.0–100.0)
Monocytes Absolute: 0.4 10*3/uL (ref 0.1–1.0)
Monocytes Relative: 9 %
Neutro Abs: 2.3 10*3/uL (ref 1.7–7.7)
Neutrophils Relative %: 49 %
Platelets: 199 10*3/uL (ref 150–400)
RBC: 4.48 MIL/uL (ref 4.22–5.81)
RDW: 13.1 % (ref 11.5–15.5)
WBC: 4.7 10*3/uL (ref 4.0–10.5)
nRBC: 0 % (ref 0.0–0.2)

## 2022-02-05 MED ORDER — CLOZAPINE 25 MG PO TABS
250.0000 mg | ORAL_TABLET | Freq: Every day | ORAL | Status: DC
  Administered 2022-02-05 – 2022-02-06 (×2): 250 mg via ORAL
  Filled 2022-02-05 (×2): qty 2

## 2022-02-05 NOTE — Progress Notes (Signed)
Pharmacy Consult - Clozapine   ?  ?61 yo male ordered clozapine 200 mg PO QHS  ?This patient's order has been reviewed for prescribing contraindications.  ?  ?Clozapine REMS enrollment Verified: yes on 01/04/22  ?REMS patient ID: [id number] ?Current Outpatient Monitoring: Monthly  ?  ?Home Regimen: ?Titration ?  ?Dose Adjustments This Admission: ?2/3 started on clozapine 25mg   ?2/10 clozapine 100mg   ?2/20 clozapine 175mg  ?2/22 clozapine 200 mg  ?  ?Labs: ?Date: 01/04/22 ANC: 2800   Submitted: 01/04/22 ?Date: 01/14/22 ANC: 2700   Submitted: 01/14/22 ?Date: 01/22/22 ANC: 4000   Submitted: 01/22/22 ?Date: 01/29/22 ANC: 2500   Submitted: 01/29/22 ?Date: 02/05/22 ANC: 2300   Submitted: 02/05/22 ?  ?  ?Plan: ?Continue clozapine with weekly monitoring ?Continue clozapine 200mg  QHS ?Monitor ANC at least weekly while inpatient  ? ?Samoria Fedorko Rodriguez-Guzman PharmD, BCPS ?02/05/2022 10:44 AM ? ? ? ?

## 2022-02-05 NOTE — TOC Progression Note (Signed)
Transition of Care (TOC) - Progression Note  ? ? ?Patient Details  ?Name: Adam [name] ?MRN: [id number] ?Date of Birth: November 22, 1961 ? ?Transition of Care (TOC) CM/SW Contact  ?Moani Weipert A Mika Anastasi, LCSW ?Phone Number: ?02/05/2022, 2:35 PM ? ?Clinical Narrative:   pt is on the waitlist at [facility], [facility], and [facility]. ? ? ? ?Expected Discharge Plan: Group Home ?Barriers to Discharge: Continued Medical Work up ? ?Expected Discharge Plan and Services ?Expected Discharge Plan: Group Home ?In-house Referral: Clinical Social Work ?  ?  ?Living arrangements for the past 2 months: Group Home ?                ?  ?  ?  ?  ?  ?  ?  ?  ?  ?  ? ? ?Social Determinants of Health (SDOH) Interventions ?  ? ?Readmission Risk Interventions ?No flowsheet data found. ? ?

## 2022-02-05 NOTE — Progress Notes (Signed)
?  Progress Note ? ?Patient: Adam [name] CNO:[id number] DOB: 1961-10-30  ?DOA: 07/20/2021  DOS: 02/05/2022  ?  ?Brief hospital course: ?61 yo WM with hx of schizophrenia and TBI, who was initially admitted to [facility] ER on July 20, 2021. He spent more than 123 days in the [facility] ED awaiting psychiatric placement.  [facility] outbursts at his group home.  He was IVC'd initially.  He was noted to be lethargic 12/19, found to have NSTEMI.  Pt was admitted to hospitalist service.  Cardiology consulted who recommended no intervention.   ?After admission to the medical service patient became severely agitated on the floor.  Pt required to ICU night of 12/24 for precedex gtt due to severe agitation and combative behavior.  PCCM was consulted.  Precedex felt to be ineffective and was turned off.  Pt was transferred back to floor on 1/4.  Hospital course remarkable for agitation, violent outbursts ?Psychiatry was consulted,making some medication changes.  They do not accept pt to inpatient psych here and recommend continued search for longer term placement.  Senior management involved.  Plan for placement on a group home. ? ?Assessment and Plan: ?* Schizophrenia, chronic condition with acute exacerbation (HCC) ?Psychiatry was following him here due to ongoing safety issues related to patient's behavioral disturbances.  ?- Continue medication as per psychiatric recommendation ?- No inpatient psychiatric admission recommended. ? ?Elevated CK ?Elevated CK most likely due to IM injections as per psych, less likely NMS.  ?- No changes in the current regimen ? ? ?Stroke [facility]) ?Hx of stroke.  Seems to have significant aphasia, otherwise no apparent deficits. ?- Continue ASA, statin. ? ?Acute metabolic encephalopathy ?Currently at baseline. Head  CT f/u doesnot show any acute findings ?- Delirium precautions ?- Psych medications per psychiatry's orders ? ? ?NSTEMI (non-ST elevated myocardial  infarction) (HCC) ?Previously this admission. ?Now Stable.   ? ?DNR (do not resuscitate)/DNI(Do Not Intubate) ?EDP verified pt's DNR/DNI status with his mother (Cyprus Crosley) ? ?Myocardial infarction [facility]) ?NSTEMI with elevated troponin (peaked to 23273), EKG with Q waves.Treated q 48 hrs heparin gtt ?- Continue aspirin, atorvastatin, metoprolol ?- Invasive evaluation deferred per cardiology.  ?- Monitoring for anginal signs or symptoms which are currently resolved. ? 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  ?- Pursuing placement which is complicated. TOC and leadership involved in challenging disposition. ? ?Acute pulmonary edema (HCC)-resolved as of 01/31/2022 ?CXR with mild cardiomegaly with diffuse interstitial opacities suggesting pulm edema - atypical/viral infection not excluded. Negative influenza/covid. This was likely due to NSTEMI. ?- Resolved. ? ?Subjective: Resting quietly, has no complaints. Had significant agitation requiring security presence yesterday afternoon which calmed with IM haldol.  ? ?Objective: ?BP (!) 146/93 (BP Location: Left Arm)   Pulse 88   Temp 98 ?F (36.7 ?C) (Oral)   Resp 20   Ht 5\' 8"  (1.727 m)   Wt 80 kg   SpO2 99%   BMI 26.82 kg/m?   ?Gen: Chronically ill-appearing older male in no distress ?Pulm: Nonlabored ? ?CT head 3/2 showed no acute intracranial process.  ? ?Family Communication: None at bedside ? ?Disposition: ?Status is: Inpatient ?Remains inpatient appropriate because: Unsafe disposition.  ? ? , MD ?02/05/2022 9:33 AM ?Page by [email or web address]  ?

## 2022-02-05 NOTE — Progress Notes (Signed)
Patient taken for a walk in the courtyard accompanied by sitter and security. No issues. Perlie Mayo, RN ? ?

## 2022-02-06 DIAGNOSIS — F209 Schizophrenia, unspecified: Secondary | ICD-10-CM | POA: Diagnosis not present

## 2022-02-06 NOTE — Progress Notes (Signed)
?  Progress Note ? ?Patient: Adam [name] HUT:[id number] DOB: March 14, 1961  ?DOA: 07/20/2021  DOS: 02/06/2022  ?  ?Brief hospital course: ?61 yo WM with hx of schizophrenia and TBI, who was initially admitted to [facility] ER on July 20, 2021. He spent more than 123 days in the [facility] ED awaiting psychiatric placement.  He was initially brought to [facility] ER due to violent outbursts at his group home.  He was IVC'd initially.  He was noted to be lethargic 12/19, found to have NSTEMI.  Pt was admitted to hospitalist service.  Cardiology consulted who recommended no intervention.   ?After admission to the medical service patient became severely agitated on the floor.  Pt required to ICU night of 12/24 for precedex gtt due to severe agitation and combative behavior.  PCCM was consulted.  Precedex felt to be ineffective and was turned off.  Pt was transferred back to floor on 1/4.  Hospital course remarkable for agitation, violent outbursts ?Psychiatry was consulted,making some medication changes.  They do not accept pt to inpatient psych here and recommend continued search for longer term placement.  Senior management involved.  Plan for placement on a group home. ? ?Assessment and Plan: ?* Schizophrenia, chronic condition with acute exacerbation (HCC) ?Psychiatry was following him here due to ongoing safety issues related to patient's behavioral disturbances.  ?- Continue medication as per psychiatric recommendation ?- No inpatient psychiatric admission recommended. ? ?Elevated CK ?Elevated CK most likely due to IM injections as per psych, less likely NMS.  ?- No changes in the current regimen ? ? ?Stroke [facility]) ?Hx of stroke.  Seems to have significant aphasia, otherwise no apparent deficits. ?- Continue ASA, statin. ? ?Acute metabolic encephalopathy ?Currently at baseline. Head  CT f/u doesnot show any acute findings ?- Delirium precautions ?- Psych medications per psychiatry's orders ? ? ?NSTEMI (non-ST elevated myocardial  infarction) (HCC) ?Previously this admission. ?Now Stable.   ? ?DNR (do not resuscitate)/DNI(Do Not Intubate) ?EDP verified pt's DNR/DNI status with his mother (Adam [name]) ? ?Myocardial infarction [facility]) ?NSTEMI with elevated troponin (peaked to 23273), EKG with Q waves.Treated q 48 hrs heparin gtt ?- Continue aspirin, atorvastatin, metoprolol ?- Invasive evaluation deferred per cardiology.  ?- Monitoring for anginal signs or symptoms which are currently resolved. ? 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  ?- Pursuing placement which is complicated. TOC and leadership involved in challenging disposition. ? ?Acute pulmonary edema (HCC)-resolved as of 01/31/2022 ?CXR with mild cardiomegaly with diffuse interstitial opacities suggesting pulm edema - atypical/viral infection not excluded. Negative influenza/covid. This was likely due to NSTEMI. ?- Resolved. ? ?Subjective: No complaints, uneventful past 24 hours. ? ?Objective: ?BP 119/81 (BP Location: Left Arm)   Pulse (!) 107   Temp 97.7 ?F (36.5 ?C) (Oral)   Resp 18   Ht 5\' 8"  (1.727 m)   Wt 80 kg   SpO2 97%   BMI 26.82 kg/m?   ?Gen: Chronically ill-appearing older male in no distress ?Pulm: Even, nonlabored ? ?CT head 3/2 showed no acute intracranial process.  ? ?Family Communication: None at bedside ? ?Disposition: ?Status is: Inpatient ?Remains inpatient appropriate because: Unsafe disposition.  ? ? , MD ?02/06/2022 12:28 PM ?Page by [email or web address]  ?

## 2022-02-07 MED ORDER — CLOZAPINE 100 MG PO TABS
300.0000 mg | ORAL_TABLET | Freq: Every day | ORAL | Status: DC
Start: 2022-02-07 — End: 2022-02-16
  Administered 2022-02-07 – 2022-02-15 (×9): 300 mg via ORAL
  Filled 2022-02-07 (×10): qty 3

## 2022-02-07 NOTE — Progress Notes (Signed)
No charge progress note. ? ?61 yo WM with hx of schizophrenia and TBI, who was initially admitted to [facility] ER on July 20, 2021. He spent more than 123 days in the [facility] ED awaiting psychiatric placement.  He was initially brought to [facility] ER due to violent outbursts at his group home.  He was IVC'd initially.  He was noted to be lethargic 12/19, found to have NSTEMI.  Pt was admitted to hospitalist service.  Cardiology consulted who recommended no intervention.   ?After admission to the medical service patient became severely agitated on the floor.  Pt required to ICU night of 12/24 for precedex gtt due to severe agitation and combative behavior.  PCCM was consulted.  Precedex felt to be ineffective and was turned off.  Pt was transferred back to floor on 1/4.  Hospital course remarkable for agitation, violent outbursts ?Psychiatry was consulted,making some medication changes.  They do not accept pt to inpatient psych here and recommend continued search for longer term placement.  Senior management involved.  Plan for placement on a group home. ? ?Patient was very aggressive and started shouting as I entered the room, stating that I should get out from here. ?Patient does not have to be at a medical floor.  All his medical issues has been resolved. ?Either psych should accept him or he can be discharged as he appears to be at his baseline from medical standpoint. ?Hospital administration is aware about the situation. ?

## 2022-02-07 NOTE — TOC Progression Note (Signed)
Transition of Care (TOC) - Progression Note  ? ? ?Patient Details  ?Name: Adam [name] ?MRN: [id number] ?Date of Birth: 08/21/1961 ? ?Transition of Care (TOC) CM/SW Contact  ?[place] Cellar, RN ?Phone Number: ?02/07/2022, 2:10 PM ? ?Clinical Narrative:    ?Copy Love Bruener [phone] requesting update on wait list for [facility], [facility] and [facility]. Confirmed patient remains on all three wait lists and will most likely be accepted to [facility]. Unable to give anticipated time frames. Confirmed [facility] remains on reduced census due to renovations and [facility] has no anticipated discharges at this time. TOC will continue to outreach weekly for update.  ? ? ?Expected Discharge Plan: Group Home ?Barriers to Discharge: Continued Medical Work up ? ?Expected Discharge Plan and Services ?Expected Discharge Plan: Group Home ?In-house Referral: Clinical Social Work ?  ?  ?Living arrangements for the past 2 months: Group Home ?                ?  ?  ?  ?  ?  ?  ?  ?  ?  ?  ? ? ?Social Determinants of Health (SDOH) Interventions ?  ? ?Readmission Risk Interventions ?No flowsheet data found. ? ?

## 2022-02-07 NOTE — Progress Notes (Signed)
Patient continues to be boisterous and aggressive towards staff, despite repeated re-education and redirection. Patient remains able to verbalize appropriate vs inappropriate behaviors.  ?

## 2022-02-07 NOTE — Progress Notes (Signed)
Patient in continuing to refuse to follow expected behaviors despite voicing clear understanding of what those behaviors are. Patient's behaviors escalating and patient becoming more aggressive towards staff. Patient medicated with PRN IM injection per order with security officer x1 present.  ? ?

## 2022-02-07 NOTE — Progress Notes (Signed)
Patient has had to have multiple conversations regarding appropriate vs not appropriate (yelling, screaming, throwing stuff, slamming objects down, being disrespectful) behaviors thus far during shift. Patient again reminded good behavior privileges will be revoked if behaviors continue.  ?

## 2022-02-07 NOTE — Progress Notes (Signed)
Patient refusing to come out of doorway where he is yelling and singing loudly. Security already present on floor. Security helped to assist patient back to room. Patient proceeded to become violent, swinging arms and legs at staff. Patient refusing to stay seated despite repeated attempts at redirection. Patient medicated with prn medication per order. Security x3 present and needed to get patient to calm down and respond to direction.  ?

## 2022-02-07 NOTE — Progress Notes (Signed)
Patient exiting room, despite attempts to be redirected by sitter. Patient stopped in hallway by charge RN who reminded patient of appropriate behaviors and reward process. Patient ambulated back to room. Patient pulled up shirt and pulled out mesh underwear to show staff. Patient requested to verbalize his needs, as per plan of care. After repeated requests to "use his words" and verbalize, patient agreeable. Patient given requested mesh underwear. Patient again reminded of appropriate vs inappropriate behaviors.  ?

## 2022-02-08 DIAGNOSIS — F209 Schizophrenia, unspecified: Secondary | ICD-10-CM | POA: Diagnosis not present

## 2022-02-08 NOTE — Progress Notes (Signed)
?   02/08/22 0930  ?Clinical Encounter Type  ?Visited With Patient  ?Visit Type Follow-up;Social support  ? ?Chaplain Sophronia Simas was present on unit when Pt made presence known. Chaplain Burris greeted Pt and did receive some recognition but Pt declined to engage. Chaplain offered her presence and social support as well as attempting to assist staff with redirecting Pt. ?

## 2022-02-08 NOTE — Progress Notes (Signed)
Mobility Specialist - Progress Note ? ? 02/08/22 1200  ?Mobility  ?Activity Ambulated independently in room;Dangled on edge of bed  ?Range of Motion/Exercises Active  ?Level of Assistance Modified independent, requires aide device or extra time  ?Assistive Device None  ?Distance Ambulated (ft) 20 ft  ?Activity Response Tolerated well  ?$Mobility charge 1 Mobility  ? ? ? ?Pt participated in brushing teeth and standing therex this AM. No complaints, pt seemingly in good spirits this date and easily redirectable to tasks at this time. Pt left with sitter still present.  ? ? ?Adam [name] ?Mobility Specialist ?02/08/22, 12:55 PM ? ? ? ? ?

## 2022-02-08 NOTE — Progress Notes (Signed)
?Progress Note ? ? ?Patient: Adam [name] PRF:[id number] DOB: 1961/06/19 DOA: 07/20/2021     80 ?DOS: the patient was seen and examined on 02/08/2022 ?  ?Brief hospital course: ?61 yo WM with hx of schizophrenia and TBI, who was initially admitted to [facility] ER on July 20, 2021. He spent more than 123 days in the [facility] ED awaiting psychiatric placement.  He was initially brought to [facility] ER due to violent outbursts at his group home.  He was IVC'd initially.  He was noted to be lethargic 12/19, found to have NSTEMI.  Pt was admitted to hospitalist service.  Cardiology consulted who recommended no intervention.   ?After admission to the medical service patient became severely agitated on the floor.  Pt required to ICU night of 12/24 for precedex gtt due to severe agitation and combative behavior.  PCCM was consulted.  Precedex felt to be ineffective and was turned off.  Pt was transferred back to floor on 1/4.  Hospital course remarkable for agitation, violent outbursts ?Psychiatry was consulted,making some medication changes.  They do not accept pt to inpatient psych here and recommend continued search for longer term placement.  Senior management involved.  Plan for placement on a group home. ? ? ?Assessment and Plan: ?* Schizophrenia, chronic condition with acute exacerbation (HCC) ?Psychiatry was following him here due to ongoing safety issues related to patient's behavioral disturbances.  ?- Continue medication as per psychiatric recommendation ?- No inpatient psychiatric admission recommended. ? ?Elevated CK ?Elevated CK most likely due to IM injections as per psych, less likely NMS.  ?- No changes in the current regimen ? ? ?Stroke [facility]) ?Hx of stroke.  Seems to have significant aphasia, otherwise no apparent deficits. ?- Continue ASA, statin. ? ?Acute metabolic encephalopathy ?Currently at baseline. Head  CT f/u doesnot show any acute findings ?- Delirium precautions ?- Psych medications per psychiatry's  orders ? ? ?NSTEMI (non-ST elevated myocardial infarction) (HCC) ?Previously this admission. ?Now Stable.   ? ?DNR (do not resuscitate)/DNI(Do Not Intubate) ?EDP verified pt's DNR/DNI status with his mother (Adam [name]) ? ?Myocardial infarction [facility]) ?NSTEMI with elevated troponin (peaked to 23273), EKG with Q waves.Treated q 48 hrs heparin gtt ?- Continue aspirin, atorvastatin, metoprolol ?- Invasive evaluation deferred per cardiology.  ?- Monitoring for anginal signs or symptoms which are currently resolved. ? 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  Becoming very agitated and aggressive at times. ?- Pursuing placement which is complicated. TOC and leadership involved in challenging disposition. ? ?Acute pulmonary edema (HCC)-resolved as of 01/31/2022 ?CXR with mild cardiomegaly with diffuse interstitial opacities suggesting pulm edema - atypical/viral infection not excluded. Negative influenza/covid. This was likely due to NSTEMI. ?- Resolved. ? ? ?Subjective: Patient was seen and examined today.  He was little less agitated and let me examine him.  He was having some abdominal pain, no bowel movement for the past few days.  Apparently refusing bowel regimen.  After talking with him he agrees to try it today. ? ?Physical Exam: ?Vitals:  ? 02/07/22 0554 02/07/22 1221 02/07/22 1625 02/07/22 2005  ?BP: 113/79 (!) 123/95 120/82 (!) 157/95  ?Pulse: 78 91 94 97  ?Resp: 18 17 17 16   ?Temp: 98.4 ?F (36.9 ?C) 97.6 ?F (36.4 ?C) 99 ?F (37.2 ?C) (!) 97.5 ?F (36.4 ?C)  ?TempSrc:    Oral  ?SpO2: 97% 99% 99% 100%  ?Weight:      ?Height:      ? ?General.  In no acute distress. ?Pulmonary.  Lungs clear bilaterally, normal respiratory effort. ?CV.  Regular rate and rhythm, no JVD, rub or murmur. ?Abdomen.  Soft, nontender, nondistended, BS positive. ?CNS.  Alert and oriented .  No focal neurologic deficit. ?Extremities.  No edema, no cyanosis, pulses intact and  symmetrical. ?Psychiatry.  Judgment and insight appears impaired. ? ?Data Reviewed: ?Prior notes and labs reviewed ? ?Family Communication:  ? ?Disposition: ?Status is: Inpatient ? ?Remains inpatient appropriate because: Unsafe discharge ? ? Planned Discharge Destination: Group home ? ?Time spent: 40 minutes ? ?This record has been created using Conservation officer, historic buildings. Errors have been sought and corrected,but may not always be located. Such creation errors do not reflect on the standard of care. ? ?Author: ?Arnetha Courser, MD ?02/08/2022 12:51 PM ? ?For on call review [email or web address].  ?

## 2022-02-09 DIAGNOSIS — K5900 Constipation, unspecified: Secondary | ICD-10-CM | POA: Diagnosis present

## 2022-02-09 DIAGNOSIS — F209 Schizophrenia, unspecified: Secondary | ICD-10-CM | POA: Diagnosis not present

## 2022-02-09 MED ORDER — MAGNESIUM HYDROXIDE 400 MG/5ML PO SUSP
30.0000 mL | Freq: Every day | ORAL | Status: DC | PRN
  Administered 2022-02-09 – 2022-02-27 (×10): 30 mL via ORAL
  Filled 2022-02-09 (×10): qty 30

## 2022-02-09 NOTE — Progress Notes (Signed)
Made Dr. Nelson Chimes aware that patient asking for milk of mag. MD gave order for PRN milk of mag.  ?

## 2022-02-09 NOTE — Progress Notes (Signed)
?Progress Note ? ? ?Patient: Adam [name] DTO:[id number] DOB: [birth year] DOA: 07/20/2021     81 ?DOS: the patient was seen and examined on 02/09/2022 ?  ?Brief hospital course: ?61 yo WM with hx of schizophrenia and TBI, who was initially admitted to [facility] ER on July 20, 2021. He spent more than 123 days in the [facility] ED awaiting psychiatric placement.  He was initially brought to [facility] ER due to violent outbursts at his group home.  He was IVC'd initially.  He was noted to be lethargic 12/19, found to have NSTEMI.  Pt was admitted to hospitalist service.  Cardiology consulted who recommended no intervention.   ?After admission to the medical service patient became severely agitated on the floor.  Pt required to ICU night of 12/24 for precedex gtt due to severe agitation and combative behavior.  PCCM was consulted.  Precedex felt to be ineffective and was turned off.  Pt was transferred back to floor on 1/4.  Hospital course remarkable for agitation, violent outbursts ?Psychiatry was consulted,making some medication changes.  They do not accept pt to inpatient psych here and recommend continued search for longer term placement.  Senior management involved.  Plan for placement on a group home. ? ? ?Assessment and Plan: ?* Schizophrenia, chronic condition with acute exacerbation (HCC) ?Psychiatry was following him here due to ongoing safety issues related to patient's behavioral disturbances.  ?- Continue medication as per psychiatric recommendation ?- No inpatient psychiatric admission recommended. ? ?Constipation ?Apparently he was refusing prior bowel regimen. ?He agreed to take MiraLAX with me but later requesting milk of magnesia from nursing staff. ?-Continue with bowel regimen-will do milk of magnesia ? ?Elevated CK ?Elevated CK most likely due to IM injections as per psych, less likely NMS.  ?- No changes in the current regimen ? ? ?Stroke [facility]) ?Hx of stroke.  Seems to have significant aphasia, otherwise no  apparent deficits. ?- Continue ASA, statin. ? ?Acute metabolic encephalopathy ?Currently at baseline. Head  CT f/u doesnot show any acute findings ?- Delirium precautions ?- Psych medications per psychiatry's orders ? ? ?NSTEMI (non-ST elevated myocardial infarction) (HCC) ?Previously this admission. ?Now Stable.   ? ?DNR (do not resuscitate)/DNI(Do Not Intubate) ?EDP verified pt's DNR/DNI status with his mother ([place] Carbonneau) ? ?Myocardial infarction [facility]) ?NSTEMI with elevated troponin (peaked to 23273), EKG with Q waves.Treated q 48 hrs heparin gtt ?- Continue aspirin, atorvastatin, metoprolol ?- Invasive evaluation deferred per cardiology.  ?- Monitoring for anginal signs or symptoms which are currently resolved. ? 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  Becoming very agitated and aggressive at times. ?- Pursuing placement which is complicated. TOC and leadership involved in challenging disposition. ? ?Acute pulmonary edema (HCC)-resolved as of 01/31/2022 ?CXR with mild cardiomegaly with diffuse interstitial opacities suggesting pulm edema - atypical/viral infection not excluded. Negative influenza/covid. This was likely due to NSTEMI. ?- Resolved. ? ? ?Subjective: Patient was again little agitated and refusing the vital signs.  No bowel movements yet but keeps saying gave me MiraLAX.  Later got message by nursing staff that he is asking for milk of magnesia. ? ?Physical Exam: ?Vitals:  ? 02/08/22 1611 02/09/22 9833 02/09/22 0942 02/09/22 0942  ?BP: 124/89 109/73 124/85 124/85  ?Pulse: 97 77 92 92  ?Resp: 18 17  18   ?Temp: 97.7 ?F (36.5 ?C) 97.7 ?F (36.5 ?C)  97.7 ?F (36.5 ?C)  ?TempSrc:    Oral  ?SpO2: 98% 98%  98%  ?Weight:      ?  Height:      ? ?General.     In no acute distress. ?Pulmonary.  Lungs clear bilaterally, normal respiratory effort. ?CV.  Regular rate and rhythm, no JVD, rub or murmur. ?Abdomen.  Soft, nontender, nondistended, BS positive. ?CNS.  Alert and  oriented .  No focal neurologic deficit. ?Extremities.  No edema, no cyanosis, pulses intact and symmetrical. ?Psychiatry.  Judgment and insight appears impaired ? ?Data Reviewed: ?Prior data reviewed ? ?Family Communication:  ? ?Disposition: ?Status is: Inpatient ? ?Remains inpatient appropriate because: Unsafe discharge ? ? ? ? Planned Discharge Destination: Group home ? ?Time spent: 38 minutes ? ?This record has been created using Conservation officer, historic buildings. Errors have been sought and corrected,but may not always be located. Such creation errors do not reflect on the standard of care. ? ?Author: ?Arnetha Courser, MD ?02/09/2022 11:22 AM ? ?For on call review [email or web address].  ?

## 2022-02-09 NOTE — Assessment & Plan Note (Signed)
Apparently he was refusing prior bowel regimen. ?He agreed to take MiraLAX with me but later requesting milk of magnesia from nursing staff. ?-Continue with bowel regimen-will do milk of magnesia ?

## 2022-02-09 NOTE — Progress Notes (Signed)
Instructed patient on importance of wearing either non-skid socks or his tennis shoes while walking. Patient initially refused and only wants to wear his ted hose. Patient eventually agreed to put his tennis shoes on. Sitter at bedside.  ?

## 2022-02-10 DIAGNOSIS — F209 Schizophrenia, unspecified: Secondary | ICD-10-CM | POA: Diagnosis not present

## 2022-02-10 NOTE — Progress Notes (Signed)
This RN was called into patient' room by the sitter that patient slide from the side of the bed and sat on the floor. This RN saw patient sitting on the floor. When assessed patient A&O at baseline, both patient and sitter said patient did not hit his head and no sign of injury noted. Patient refused vitals check, neuro check and all other assessment. On-call notified, family member notified. We continue to monitor  ?

## 2022-02-10 NOTE — Progress Notes (Signed)
?   02/10/22 2326  ?What Happened  ?Was fall witnessed? Yes  ?Patients activity before fall other (comment) ?(sitting at the side of the bed)  ?Point of contact buttocks  ?Was patient injured? No  ?Follow Up  ?MD notified Manuela Schwartz, NP  ?Time MD notified 2315  ?Family notified Yes - comment  ?Time family notified 2315  ?Additional tests No  ?Progress note created (see row info) Yes  ?Adult Fall Risk Assessment  ?Risk Factor Category (scoring not indicated) Fall has occurred during this admission (document High fall risk)  ?Age 61  ?Fall History: Fall within 6 months prior to admission 5  ?Elimination; Bowel and/or Urine Incontinence 0  ?Elimination; Bowel and/or Urine Urgency/Frequency 0  ?Medications: includes PCA/Opiates, Anti-convulsants, Anti-hypertensives, Diuretics, Hypnotics, Laxatives, Sedatives, and Psychotropics 5  ?Patient Care Equipment 0  ?Mobility-Assistance 0  ?Mobility-Gait 0  ?Mobility-Sensory Deficit 0  ?Altered awareness of immediate physical environment 1  ?Impulsiveness 2  ?Lack of understanding of one's physical/cognitive limitations 4  ?Total Score 18  ?Patient Fall Risk Level High fall risk  ?Adult Fall Risk Interventions  ?Required Bundle Interventions *See Row Information* High fall risk - low, moderate, and high requirements implemented  ?Additional Interventions Safety Sitter/Safety Rounder  ?Screening for Fall Injury Risk (To be completed on HIGH fall risk patients) - Assessing Need for Floor Mats  ?Risk For Fall Injury- Criteria for Floor Mats Noncompliant with safety precautions  ?Pain Assessment  ?Pain Scale 0-10  ?Pain Score 0  ?PCA/Epidural/Spinal Assessment  ?Respiratory Pattern Regular;Unlabored  ?Neurological  ?Neuro (WDL) X  ?Level of Consciousness Alert  ?Orientation Level Oriented to person  ?Cognition Poor attention/concentration;Poor judgement;Poor safety awareness  ?Speech Expressive aphasia  ?Musculoskeletal  ?Musculoskeletal (WDL) X  ?Assistive Device None   ?Generalized Weakness Yes  ?Weight Bearing Restrictions No  ?Integumentary  ?Integumentary (WDL) X  ?Skin Color Appropriate for ethnicity  ?Skin Condition Dry  ?Skin Turgor Non-tenting  ? ? ?

## 2022-02-10 NOTE — Progress Notes (Signed)
?Progress Note ? ? ?Patient: Adam [name] WIO:[id number] DOB: 1961-04-06 DOA: 07/20/2021     82 ?DOS: the patient was seen and examined on 02/10/2022 ?  ?Brief hospital course: ?61 yo WM with hx of schizophrenia and TBI, who was initially admitted to [facility] ER on July 20, 2021. He spent more than 123 days in the [facility] ED awaiting psychiatric placement.  He was initially brought to [facility] ER due to violent outbursts at his group home.  He was IVC'd initially.  He was noted to be lethargic 12/19, found to have NSTEMI.  Pt was admitted to hospitalist service.  Cardiology consulted who recommended no intervention.   ?After admission to the medical service patient became severely agitated on the floor.  Pt required to ICU night of 12/24 for precedex gtt due to severe agitation and combative behavior.  PCCM was consulted.  Precedex felt to be ineffective and was turned off.  Pt was transferred back to floor on 1/4.  Hospital course remarkable for agitation, violent outbursts ?Psychiatry was consulted,making some medication changes.  They do not accept pt to inpatient psych here and recommend continued search for longer term placement.  Senior management involved.  Plan for placement on a group home. ? ? ? ? ?Assessment and Plan: ?* Schizophrenia, chronic condition with acute exacerbation (HCC) ?Psychiatry was following him here due to ongoing safety issues related to patient's behavioral disturbances.  ?- Continue medication as per psychiatric recommendation ?- No inpatient psychiatric admission recommended. ? ?Constipation ?Apparently he was refusing prior bowel regimen. ?He agreed to take MiraLAX with me but later requesting milk of magnesia from nursing staff. ?-Continue with bowel regimen-will do milk of magnesia ? ?Elevated CK ?Elevated CK most likely due to IM injections as per psych, less likely NMS.  ?- No changes in the current regimen ? ? ?Stroke [facility]) ?Hx of stroke.  Seems to have significant aphasia, otherwise no  apparent deficits. ?- Continue ASA, statin. ? ?Acute metabolic encephalopathy ?Currently at baseline. Head  CT f/u doesnot show any acute findings ?- Delirium precautions ?- Psych medications per psychiatry's orders ? ? ?NSTEMI (non-ST elevated myocardial infarction) (HCC) ?Previously this admission. ?Now Stable.   ? ?DNR (do not resuscitate)/DNI(Do Not Intubate) ?EDP verified pt's DNR/DNI status with his mother ([place] Frandsen) ? ?Myocardial infarction [facility]) ?NSTEMI with elevated troponin (peaked to 23273), EKG with Q waves.Treated q 48 hrs heparin gtt ?- Continue aspirin, atorvastatin, metoprolol ?- Invasive evaluation deferred per cardiology.  ?- Monitoring for anginal signs or symptoms which are currently resolved. ? 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  Becoming very agitated and aggressive at times. ?- Pursuing placement which is complicated. TOC and leadership involved in challenging disposition. ? ?Acute pulmonary edema (HCC)-resolved as of 01/31/2022 ?CXR with mild cardiomegaly with diffuse interstitial opacities suggesting pulm edema - atypical/viral infection not excluded. Negative influenza/covid. This was likely due to NSTEMI. ?- Resolved. ? ? ?Subjective: Patient seems little less agitated and eating breakfast when seen today.  No new complaints. ? ?Physical Exam: ?Vitals:  ? 02/09/22 0942 02/09/22 1520 02/09/22 2051 02/10/22 1231  ?BP: 124/85 (!) 133/93 (!) 126/95 (!) 142/95  ?Pulse: 92 94 100 93  ?Resp: 18 19 17 20   ?Temp: 97.7 ?F (36.5 ?C) 97.8 ?F (36.6 ?C) 98.1 ?F (36.7 ?C) 98 ?F (36.7 ?C)  ?TempSrc: Oral Oral    ?SpO2: 98% 97% 96% 99%  ?Weight:      ?Height:      ? ?General.  In no acute distress. ?Pulmonary.  Lungs clear bilaterally, normal respiratory effort. ?CV.  Regular rate and rhythm, no JVD, rub or murmur. ?Abdomen.  Soft, nontender, nondistended, BS positive. ?CNS.  Alert and oriented .  No focal neurologic deficit. ?Extremities.  No edema, no  cyanosis, pulses intact and symmetrical. ?Psychiatry.  Judgment and insight appears impaired. ? ?Data Reviewed: ?No new labs to be reviewed. ? ?Family Communication:  ? ?Disposition: ?Status is: Inpatient ? ?Remains inpatient appropriate because: Unsafe discharge ? ? Planned Discharge Destination: Group home ? ?Time spent: 35 minutes ? ?This record has been created using Conservation officer, historic buildings. Errors have been sought and corrected,but may not always be located. Such creation errors do not reflect on the standard of care. ? ?Author: ?Arnetha Courser, MD ?02/10/2022 12:41 PM ? ?For on call review [email or web address].  ?

## 2022-02-11 DIAGNOSIS — F209 Schizophrenia, unspecified: Secondary | ICD-10-CM | POA: Diagnosis not present

## 2022-02-11 NOTE — Progress Notes (Signed)
?Progress Note ? ? ?Patient: Adam [name] SUO:[id number] DOB: March 21, 1961 DOA: 07/20/2021     83 ?DOS: the patient was seen and examined on 02/11/2022 ?  ?Brief hospital course: ?61 yo WM with hx of schizophrenia and TBI, who was initially admitted to [facility] ER on July 20, 2021. He spent more than 123 days in the [facility] ED awaiting psychiatric placement.  He was initially brought to [facility] ER due to violent outbursts at his group home.  He was IVC'd initially.  He was noted to be lethargic 12/19, found to have NSTEMI.  Pt was admitted to hospitalist service.  Cardiology consulted who recommended [name] intervention.   ?After admission to the medical service patient became severely agitated on the floor.  Pt required to ICU night of 12/24 for precedex gtt due to severe agitation and combative behavior.  PCCM was consulted.  Precedex felt to be ineffective and was turned off.  Pt was transferred back to floor on 1/4.  Hospital course remarkable for agitation, violent outbursts ?Psychiatry was consulted,making some medication changes.  They do not accept pt to inpatient psych here and recommend continued search for longer term placement.  Senior management involved.  Plan for placement on a group home. ? ? ? ? ?Assessment and Plan: ?* Schizophrenia, chronic condition with acute exacerbation (HCC) ?Psychiatry was following him here due to ongoing safety issues related to patient's behavioral disturbances.  ?- Continue medication as per psychiatric recommendation ?- [name] inpatient psychiatric admission recommended. ? ?Constipation ?Apparently he was refusing prior bowel regimen. ?He agreed to take MiraLAX with me but later requesting milk of magnesia from nursing staff. ?-Continue with bowel regimen-will do milk of magnesia ? ?Elevated CK ?Elevated CK most likely due to IM injections as per psych, less likely NMS.  ?- [name] changes in the current regimen ? ? ?Stroke [facility]) ?Hx of stroke.  Seems to have significant aphasia, otherwise [name]  apparent deficits. ?- Continue ASA, statin. ? ?Acute metabolic encephalopathy ?Currently at baseline. Head  CT f/u doesnot show any acute findings ?- Delirium precautions ?- Psych medications per psychiatry's orders ? ? ?NSTEMI (non-ST elevated myocardial infarction) (HCC) ?Previously this admission. ?Now Stable.   ? ?DNR (do not resuscitate)/DNI(Do Not Intubate) ?EDP verified pt's DNR/DNI status with his mother (Adam [name]) ? ?Myocardial infarction [facility]) ?NSTEMI with elevated troponin (peaked to 23273), EKG with Q waves.Treated q 48 hrs heparin gtt ?- Continue aspirin, atorvastatin, metoprolol ?- Invasive evaluation deferred per cardiology.  ?- Monitoring for anginal signs or symptoms which are currently resolved. ? 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  Becoming very agitated and aggressive at times. ?- Pursuing placement which is complicated. TOC and leadership involved in challenging disposition. ? ?Acute pulmonary edema (HCC)-resolved as of 01/31/2022 ?CXR with mild cardiomegaly with diffuse interstitial opacities suggesting pulm edema - atypical/viral infection not excluded. Negative influenza/covid. This was likely due to NSTEMI. ?- Resolved. ? ? ?Subjective: Per nursing staff patient slid out of bed last night, [name] injuries. ?During morning rounds patient was just woke up and feeling little agitated.  [name] complaints ? ?Physical Exam: ?Vitals:  ? 02/10/22 1231 02/10/22 1601 02/10/22 2138 02/11/22 0508  ?BP: (!) 142/95 (!) 128/94 130/85 128/82  ?Pulse: 93 98 92 88  ?Resp: 20 16 20 20   ?Temp: 98 ?F (36.7 ?C) (!) 97.5 ?F (36.4 ?C) 98.5 ?F (36.9 ?C) 98.4 ?F (36.9 ?C)  ?TempSrc:  Oral  Oral  ?SpO2: 99% 99% 99% 97%  ?Weight:      ?  Height:      ? ?General.     In [name] acute distress. ?Pulmonary.  Lungs clear bilaterally, normal respiratory effort. ?CV.  Regular rate and rhythm, [name] JVD, rub or murmur. ?Abdomen.  Soft, nontender, nondistended, BS positive. ?CNS.  Alert and  oriented x3.  [name] focal neurologic deficit. ?Extremities.  [name] edema, [name] cyanosis, pulses intact and symmetrical. ?Psychiatry.  Judgment and insight appears normal. ? ?Data Reviewed: ?[name] labs to review today. ? ?Family Communication:  ? ?Disposition: ?Status is: Inpatient ? ?Remains inpatient appropriate because: Unsafe discharge ? ?Planned Discharge Destination: Group home ? ?Time spent: 38 minutes ? ?This record has been created using Conservation officer, historic buildings. Errors have been sought and corrected,but may not always be located. Such creation errors do not reflect on the standard of care. ? ?Author: ?Arnetha Courser, MD ?02/11/2022 3:01 PM ? ?For on call review [email or web address].  ?

## 2022-02-12 DIAGNOSIS — F209 Schizophrenia, unspecified: Secondary | ICD-10-CM | POA: Diagnosis not present

## 2022-02-12 LAB — COMPREHENSIVE METABOLIC PANEL
ALT: 22 U/L (ref 0–44)
AST: 18 U/L (ref 15–41)
Albumin: 3.6 g/dL (ref 3.5–5.0)
Alkaline Phosphatase: 93 U/L (ref 38–126)
Anion gap: 8 (ref 5–15)
BUN: 43 mg/dL — ABNORMAL HIGH (ref 8–23)
CO2: 25 mmol/L (ref 22–32)
Calcium: 9.2 mg/dL (ref 8.9–10.3)
Chloride: 105 mmol/L (ref 98–111)
Creatinine, Ser: 1.05 mg/dL (ref 0.61–1.24)
GFR, Estimated: >60 mL/min (ref >60)
Glucose, Bld: 108 mg/dL — ABNORMAL HIGH (ref 70–99)
Potassium: 4.3 mmol/L (ref 3.5–5.1)
Sodium: 138 mmol/L (ref 135–145)
Total Bilirubin: 0.6 mg/dL (ref 0.3–1.2)
Total Protein: 6.9 g/dL (ref 6.5–8.1)

## 2022-02-12 NOTE — Progress Notes (Signed)
?Progress Note ? ? ?Patient: Adam [name] EUM:[id number] DOB: [birth year] DOA: 07/20/2021     84 ?DOS: the patient was seen and examined on 02/12/2022 ?  ?Brief hospital course: ?61 yo WM with hx of schizophrenia and TBI, who was initially admitted to [facility] ER on July 20, 2021. He spent more than 123 days in the [facility] ED awaiting psychiatric placement.  He was initially brought to [facility] ER due to violent outbursts at his group home.  He was IVC'd initially.  He was noted to be lethargic 12/19, found to have NSTEMI.  Pt was admitted to hospitalist service.  Cardiology consulted who recommended no intervention.   ?After admission to the medical service patient became severely agitated on the floor.  Pt required to ICU night of 12/24 for precedex gtt due to severe agitation and combative behavior.  PCCM was consulted.  Precedex felt to be ineffective and was turned off.  Pt was transferred back to floor on 1/4.  Hospital course remarkable for agitation, violent outbursts ?Psychiatry was consulted,making some medication changes.  They do not accept pt to inpatient psych here and recommend continued search for longer term placement.  Senior management involved.  Plan for placement on a group home. ? ? ? ? ?Assessment and Plan: ?* Schizophrenia, chronic condition with acute exacerbation (HCC) ?Psychiatry was following him here due to ongoing safety issues related to patient's behavioral disturbances.  ?- Continue medication as per psychiatric recommendation ?- No inpatient psychiatric admission recommended. ? ?Constipation ?Apparently he was refusing prior bowel regimen. ?He agreed to take MiraLAX with me but later requesting milk of magnesia from nursing staff. ?-Continue with bowel regimen-will do milk of magnesia ? ?Elevated CK ?Elevated CK most likely due to IM injections as per psych, less likely NMS.  ?- No changes in the current regimen ? ? ?Stroke [facility]) ?Hx of stroke.  Seems to have significant aphasia, otherwise no  apparent deficits. ?- Continue ASA, statin. ? ?Acute metabolic encephalopathy ?Currently at baseline. Head  CT f/u doesnot show any acute findings ?- Delirium precautions ?- Psych medications per psychiatry's orders ? ? ?NSTEMI (non-ST elevated myocardial infarction) (HCC) ?Previously this admission. ?Now Stable.   ? ?DNR (do not resuscitate)/DNI(Do Not Intubate) ?EDP verified pt's DNR/DNI status with his mother ([place] Villanueva) ? ?Myocardial infarction [facility]) ?NSTEMI with elevated troponin (peaked to 23273), EKG with Q waves.Treated q 48 hrs heparin gtt ?- Continue aspirin, atorvastatin, metoprolol ?- Invasive evaluation deferred per cardiology.  ?- Monitoring for anginal signs or symptoms which are currently resolved. ? 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  Becoming very agitated and aggressive at times. ?- Pursuing placement which is complicated. TOC and leadership involved in challenging disposition. ? ?Acute pulmonary edema (HCC)-resolved as of 01/31/2022 ?CXR with mild cardiomegaly with diffuse interstitial opacities suggesting pulm edema - atypical/viral infection not excluded. Negative influenza/covid. This was likely due to NSTEMI. ?- Resolved. ? ? ?Subjective: Patient was resting comfortably when seen today.  No nursing concerns. ? ?Physical Exam: ?Vitals:  ? 02/10/22 1601 02/10/22 2138 02/11/22 0508 02/11/22 1921  ?BP: (!) 128/94 130/85 128/82 111/84  ?Pulse: 98 92 88 98  ?Resp: 16 20 20 20   ?Temp: (!) 97.5 ?F (36.4 ?C) 98.5 ?F (36.9 ?C) 98.4 ?F (36.9 ?C) 98.1 ?F (36.7 ?C)  ?TempSrc: Oral  Oral   ?SpO2: 99% 99% 97% 97%  ?Weight:      ?Height:      ? ?General.     In no acute  distress. ?Pulmonary.  Lungs clear bilaterally, normal respiratory effort. ?CV.  Regular rate and rhythm, no JVD, rub or murmur. ?Abdomen.  Soft, nontender, nondistended, BS positive. ?CNS.  Alert and oriented x3.  No focal neurologic deficit. ?Extremities.  No edema, no cyanosis, pulses  intact and symmetrical. ?Psychiatry.  Judgment and insight appears normal. ? ?Data Reviewed: ? ?There are no new results to review at this time. ? ?Family Communication:  ? ?Disposition: ?Status is: Inpatient ? ?Remains inpatient appropriate because: Unsafe discharge ? ? Planned Discharge Destination: Group home ? ?Time spent: 37 minutes ? ?This record has been created using Conservation officer, historic buildings. Errors have been sought and corrected,but may not always be located. Such creation errors do not reflect on the standard of care. ? ?Author: ?Arnetha Courser, MD ?02/12/2022 2:36 PM ? ?For on call review [email or web address].  ?

## 2022-02-12 NOTE — TOC Progression Note (Signed)
Transition of Care (TOC) - Progression Note  ? ? ?Patient Details  ?Name: Adam [name] ?MRN: [id number] ?Date of Birth: Oct 19, 1961 ? ?Transition of Care (TOC) CM/SW Contact  ?Anikah Hogge A Lorenda Grecco, LCSW ?Phone Number: ?02/12/2022, 3:39 PM ? ?Clinical Narrative:   NO changes. Pt remains on waitlist at [facility], [place] and [place] ? ? ? ?Expected Discharge Plan: Group Home ?Barriers to Discharge: Continued Medical Work up ? ?Expected Discharge Plan and Services ?Expected Discharge Plan: Group Home ?In-house Referral: Clinical Social Work ?  ?  ?Living arrangements for the past 2 months: Group Home ?                ?  ?  ?  ?  ?  ?  ?  ?  ?  ?  ? ? ?Social Determinants of Health (SDOH) Interventions ?  ? ?Readmission Risk Interventions ?No flowsheet data found. ? ?

## 2022-02-13 DIAGNOSIS — F209 Schizophrenia, unspecified: Secondary | ICD-10-CM | POA: Diagnosis not present

## 2022-02-13 LAB — CBC WITH DIFFERENTIAL/PLATELET
Abs Immature Granulocytes: 0.02 10*3/uL (ref 0.00–0.07)
Basophils Absolute: 0 10*3/uL (ref 0.0–0.1)
Basophils Relative: 1 %
Eosinophils Absolute: 0.1 10*3/uL (ref 0.0–0.5)
Eosinophils Relative: 3 %
HCT: 41.3 % (ref 39.0–52.0)
Hemoglobin: 14 g/dL (ref 13.0–17.0)
Immature Granulocytes: 0 %
Lymphocytes Relative: 28 %
Lymphs Abs: 1.3 10*3/uL (ref 0.7–4.0)
MCH: 31.6 pg (ref 26.0–34.0)
MCHC: 33.9 g/dL (ref 30.0–36.0)
MCV: 93.2 fL (ref 80.0–100.0)
Monocytes Absolute: 0.3 10*3/uL (ref 0.1–1.0)
Monocytes Relative: 6 %
Neutro Abs: 2.8 10*3/uL (ref 1.7–7.7)
Neutrophils Relative %: 62 %
Platelets: 155 10*3/uL (ref 150–400)
RBC: 4.43 MIL/uL (ref 4.22–5.81)
RDW: 12.9 % (ref 11.5–15.5)
WBC: 4.6 10*3/uL (ref 4.0–10.5)
nRBC: 0 % (ref 0.0–0.2)

## 2022-02-13 NOTE — Progress Notes (Signed)
Pharmacy Consult - Clozapine   ?  ?61 yo male ordered clozapine 200 mg PO QHS  ?This patient's order has been reviewed for prescribing contraindications.  ?  ?Clozapine REMS enrollment Verified: yes on 01/04/22  ?REMS patient ID: [id number] ?Current Outpatient Monitoring: Monthly  ?  ?Home Regimen: ?Titration ?  ?Dose Adjustments This Admission: ?2/3 started on clozapine 25mg   ?2/10 clozapine 100mg   ?2/20 clozapine 175mg  ?2/22 clozapine 200 mg  ?3/08 clozapine 300 mg ?  ?Labs: ?Date: 01/04/22 ANC: 2800   Submitted: 01/04/22 ?Date: 01/14/22 ANC: 2700   Submitted: 01/14/22 ?Date: 01/22/22 ANC: 4000   Submitted: 01/22/22 ?Date: 01/29/22 ANC: 2500   Submitted: 01/29/22 ?Date: 02/05/22 ANC: 2300   Submitted: 02/05/22 ?Date: 02/13/22 ANC: 2800   Submitted: 02/07/22 ?  ?Plan: ?Continue clozapine with weekly monitoring ?Continue clozapine 300mg  QHS ?Monitor ANC at least weekly while inpatient  ? ?Darnelle Bos, PharmD ?02/13/2022 3:32 PM ? ? ? ?

## 2022-02-13 NOTE — Progress Notes (Signed)
?Progress Note ? ? ?Patient: Adam [name] EXB:[id number] DOB: 08/27/1961 DOA: 07/20/2021     85 ?DOS: the patient was seen and examined on 02/13/2022 ?  ?Brief hospital course: ?61 yo WM with hx of schizophrenia and TBI, who was initially admitted to [facility] ER on July 20, 2021. He spent more than 123 days in the [facility] ED awaiting psychiatric placement.  He was initially brought to [facility] ER due to violent outbursts at his group home.  He was IVC'd initially.  He was noted to be lethargic 12/19, found to have NSTEMI.  Pt was admitted to hospitalist service.  Cardiology consulted who recommended no intervention.   ?After admission to the medical service patient became severely agitated on the floor.  Pt required to ICU night of 12/24 for precedex gtt due to severe agitation and combative behavior.  PCCM was consulted.  Precedex felt to be ineffective and was turned off.  Pt was transferred back to floor on 1/4.  Hospital course remarkable for agitation, violent outbursts ?Psychiatry was consulted,making some medication changes.  They do not accept pt to inpatient psych here and recommend continued search for longer term placement.  Senior management involved.  Plan for placement on a group home. ? ? ? ? ?Assessment and Plan: ?* Schizophrenia, chronic condition with acute exacerbation (HCC) ?Psychiatry was following him here due to ongoing safety issues related to patient's behavioral disturbances.  ?- Continue medication as per psychiatric recommendation ?- No inpatient psychiatric admission recommended. ? ?Constipation ?Apparently he was refusing prior bowel regimen. ?He agreed to take MiraLAX with me but later requesting milk of magnesia from nursing staff. ?-Continue with bowel regimen-will do milk of magnesia as needed. ? ?Elevated CK ?Elevated CK most likely due to IM injections as per psych, less likely NMS.  ?- No changes in the current regimen ? ? ?Stroke [facility]) ?Hx of stroke.  Seems to have significant aphasia,  otherwise no apparent deficits. ?- Continue ASA, statin. ? ?Acute metabolic encephalopathy ?Currently at baseline. Head  CT f/u doesnot show any acute findings ?- Delirium precautions ?- Psych medications per psychiatry's orders ? ? ?NSTEMI (non-ST elevated myocardial infarction) (HCC) ?Previously this admission. ?Now Stable.   ? ?DNR (do not resuscitate)/DNI(Do Not Intubate) ?EDP verified pt's DNR/DNI status with his mother ([name]) ? ?Myocardial infarction [facility]) ?NSTEMI with elevated troponin (peaked to 23273), EKG with Q waves.Treated q 48 hrs heparin gtt ?- Continue aspirin, atorvastatin, metoprolol ?- Invasive evaluation deferred per cardiology.  ?- Monitoring for anginal signs or symptoms which are currently resolved. ? 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  Becoming very agitated and aggressive at times. ?- Pursuing placement which is complicated. TOC and leadership involved in challenging disposition. ? ?Acute pulmonary edema (HCC)-resolved as of 01/31/2022 ?CXR with mild cardiomegaly with diffuse interstitial opacities suggesting pulm edema - atypical/viral infection not excluded. Negative influenza/covid. This was likely due to NSTEMI. ?- Resolved. ? ? ?Subjective: Patient was seen and examined today.  Eating breakfast.  Becoming agitated at time.  Refused labs. ? ?Physical Exam: ?Vitals:  ? 02/11/22 1921 02/12/22 2030 02/13/22 0040 02/13/22 1007  ?BP: 111/84 139/87 101/74 124/79  ?Pulse: 98 92 97 95  ?Resp: 20 16 16 18   ?Temp: 98.1 ?F (36.7 ?C) 98.5 ?F (36.9 ?C) 98.6 ?F (37 ?C) 97.6 ?F (36.4 ?C)  ?TempSrc:      ?SpO2: 97% 97% 97% 96%  ?Weight:      ?Height:      ? ?General.  In no acute distress. ?Pulmonary.  Lungs clear bilaterally, normal respiratory effort. ?CV.  Regular rate and rhythm, no JVD, rub or murmur. ?Abdomen.  Soft, nontender, nondistended, BS positive. ?CNS.  Alert and oriented .  No focal neurologic deficit. ?Extremities.  No edema, no  cyanosis, pulses intact and symmetrical. ?Psychiatry.  Judgment and insight appears impaired ? ?Data Reviewed: ? ?There are no new results to review at this time. ? ?Family Communication:  ? ?Disposition: ?Status is: Inpatient ? ?Remains inpatient appropriate because: Unsafe discharge ? ?Planned Discharge Destination: Home, group home ? ?Time spent: 35 minutes ? ?This record has been created using Conservation officer, historic buildings. Errors have been sought and corrected,but may not always be located. Such creation errors do not reflect on the standard of care. ? ?Author: ?Arnetha Courser, MD ?02/13/2022 3:58 PM ? ?For on call review [email or web address].  ?

## 2022-02-13 NOTE — Progress Notes (Signed)
Pt irritable tonight, refused vital signs check. Pt is compliant with medication except for colace.  ?

## 2022-02-13 NOTE — Assessment & Plan Note (Signed)
Apparently he was refusing prior bowel regimen. ?He agreed to take MiraLAX with me but later requesting milk of magnesia from nursing staff. ?-Continue with bowel regimen-will do milk of magnesia as needed. ?

## 2022-02-14 DIAGNOSIS — J810 Acute pulmonary edema: Secondary | ICD-10-CM | POA: Diagnosis not present

## 2022-02-14 DIAGNOSIS — G9341 Metabolic encephalopathy: Secondary | ICD-10-CM | POA: Diagnosis not present

## 2022-02-14 DIAGNOSIS — F209 Schizophrenia, unspecified: Secondary | ICD-10-CM | POA: Diagnosis not present

## 2022-02-14 DIAGNOSIS — G3189 Other specified degenerative diseases of nervous system: Secondary | ICD-10-CM | POA: Diagnosis not present

## 2022-02-14 NOTE — Assessment & Plan Note (Signed)
Apparently he was refusing prior bowel regimen. ?He agreed to take MiraLAX with me but later requesting milk of magnesia from nursing staff. ?-Continue with bowel regimen-will do milk of magnesia as needed ?

## 2022-02-14 NOTE — Assessment & Plan Note (Signed)
EDP verified pt's DNR/DNI status with his mother (Georgia Catherman) °

## 2022-02-14 NOTE — Assessment & Plan Note (Signed)
Psychiatry was following him here due to ongoing safety issues related to patient's behavioral disturbances.  °- Continue medication as per psychiatric recommendation °- No inpatient psychiatric admission recommended. °

## 2022-02-14 NOTE — Progress Notes (Addendum)
Pt irritable refused miralax and colace tonight but taken all the medicine. Will continue to monitor. ?

## 2022-02-14 NOTE — Assessment & Plan Note (Signed)
Chronic.  Becoming very agitated and aggressive at times. °- Pursuing placement which is complicated. TOC and leadership involved in challenging disposition. °

## 2022-02-14 NOTE — Assessment & Plan Note (Signed)
Hx of stroke.  Seems to have significant aphasia, otherwise no apparent deficits. ?- Continue ASA, statin ?

## 2022-02-14 NOTE — Assessment & Plan Note (Signed)
Currently at baseline. Head  CT f/u doesnot show any acute findings °- Delirium precautions °- Psych medications per psychiatry's orders ° °

## 2022-02-14 NOTE — Assessment & Plan Note (Signed)
NSTEMI with elevated troponin (peaked to 23273), EKG with Q waves.Treated q 48 hrs heparin gtt °- Continue aspirin, atorvastatin, metoprolol °- Invasive evaluation deferred per cardiology.  °- Monitoring for anginal signs or symptoms which are currently resolved. °

## 2022-02-14 NOTE — Assessment & Plan Note (Signed)
Previously this admission. ? ?

## 2022-02-14 NOTE — Assessment & Plan Note (Signed)
Elevated CK most likely due to IM injections as per psych, less likely NMS.  ?- No changes in the current regimen. ? ?

## 2022-02-14 NOTE — Progress Notes (Signed)
?Progress Note ? ? ?Patient: Adam [name] [id number] DOB: 62/03/1961 DOA: 07/20/2021     62 ?DOS: the patient was seen and examined on 02/14/2022 ?  ?Brief hospital course: ?61 yo WM with hx of schizophrenia and TBI, who was initially admitted to [facility] ER on July 20, 2021. He spent more than 123 days in the [facility] ED awaiting psychiatric placement.  He was initially brought to [facility] ER due to violent outbursts at his group home.  He was IVC'd initially.  He was noted to be lethargic 12/19, found to have NSTEMI.  Pt was admitted to hospitalist service.  Cardiology consulted who recommended no intervention.   ?After admission to the medical service patient became severely agitated on the floor.  Pt required to ICU night of 12/24 for precedex gtt due to severe agitation and combative behavior.  PCCM was consulted.  Precedex felt to be ineffective and was turned off.  Pt was transferred back to floor on 1/4.  Hospital course remarkable for agitation, violent outbursts ?Psychiatry was consulted,making some medication changes.  They do not accept pt to inpatient psych here and recommend continued search for longer term placement.  Senior management involved.  Plan for placement on a group home. ? ?3/15-difficult placement ? ? ? ? ?Assessment and Plan: ?* Schizophrenia, chronic condition with acute exacerbation ([place]) ?Psychiatry was following him here due to ongoing safety issues related to patient's behavioral disturbances.  ?- Continue medication as per psychiatric recommendation ?- No inpatient psychiatric admission recommended ? ?Constipation ?Apparently he was refusing prior bowel regimen. ?He agreed to take MiraLAX with me but later requesting milk of magnesia from nursing staff. ?-Continue with bowel regimen-will do milk of magnesia as needed ? ?Elevated CK ?Elevated CK most likely due to IM injections as per psych, less likely NMS.  ?- No changes in the current regimen. ? ? ?Stroke [facility]) ?Hx of stroke.  Seems to have  significant aphasia, otherwise no apparent deficits. ?- Continue ASA, statin ? ?Acute metabolic encephalopathy ?Currently at baseline. Head  CT f/u doesnot show any acute findings ?- Delirium precautions ?- Psych medications per psychiatry's orders. ? ? ?NSTEMI (non-ST elevated myocardial infarction) ([place]) ?Previously this admission. ? ? ?DNR (do not resuscitate)/DNI(Do Not Intubate) ?EDP verified pt's DNR/DNI status with his mother ([place]). ? ?Myocardial infarction [facility]) ?NSTEMI with elevated troponin (peaked to 23273), EKG with Q waves.Treated q 48 hrs heparin gtt ?- Continue aspirin, atorvastatin, metoprolol ?- Invasive evaluation deferred per cardiology.  ?- Monitoring for anginal signs or symptoms which are currently resolved ? 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  Becoming very agitated and aggressive at times. ?- Pursuing placement which is complicated. TOC and leadership involved in challenging disposition ? ?Acute pulmonary edema (HCC)-resolved as of 01/31/2022 ?CXR with mild cardiomegaly with diffuse interstitial opacities suggesting pulm edema - atypical/viral infection not excluded. Negative influenza/covid. This was likely due to NSTEMI. ?- Resolved. ? ? ? ? ?  ? ?Subjective: Patient sleeping, sitter at bedside ? ?Physical Exam: ?Vitals:  ? 02/13/22 0040 02/13/22 1007 02/13/22 2153 02/14/22 1603  ?BP: 101/74 124/79 118/84 115/90  ?Pulse: 97 95 90 100  ?Resp: 16 18 20 16   ?Temp: 98.6 ?F (37 ?C) 97.6 ?F (36.4 ?C) 98.2 ?F (36.8 ?C) 98 ?F (36.7 ?C)  ?TempSrc:    Oral  ?SpO2: 97% 96% 100% 98%  ?Weight:      ?Height:      ? ?General.     In no acute distress. ?  Pulmonary.  Lungs clear bilaterally, normal respiratory effort. ?CV.  Regular rate and rhythm, no JVD, rub or murmur. ?Abdomen.  Soft, nontender, nondistended, BS positive. ?CNS.   No focal neurologic deficit. ?Extremities.  No edema, no cyanosis, pulses intact and symmetrical. ?Psychiatry.  Judgment  and insight appears impaired ? ?Data Reviewed: ? ?There are no new results to review at this time. ? ?Family Communication: None at bedside ? ?Disposition: ?Status is: Inpatient ? ?Remains inpatient appropriate because: Difficult placement, long length of stay.  Medically stable ? ? ? ? Planned Discharge Destination: Barriers to discharge: Difficult placement.  TOC working on it ? ? ? DVT prophylaxis-SCDs ? ?Time spent: 10 minutes ? ?Author: ?Max Sane, MD ?02/14/2022 4:55 PM ? ?For on call review [email or web address].  ?

## 2022-02-15 DIAGNOSIS — J810 Acute pulmonary edema: Secondary | ICD-10-CM | POA: Diagnosis not present

## 2022-02-15 DIAGNOSIS — F209 Schizophrenia, unspecified: Secondary | ICD-10-CM | POA: Diagnosis not present

## 2022-02-15 DIAGNOSIS — G9341 Metabolic encephalopathy: Secondary | ICD-10-CM | POA: Diagnosis not present

## 2022-02-15 DIAGNOSIS — G3189 Other specified degenerative diseases of nervous system: Secondary | ICD-10-CM | POA: Diagnosis not present

## 2022-02-15 NOTE — Assessment & Plan Note (Signed)
Elevated CK most likely due to IM injections as per psych, less likely NMS.  ?- No changes in the current regimen. ? ?

## 2022-02-15 NOTE — Assessment & Plan Note (Signed)
Previously this admission. ? ?

## 2022-02-15 NOTE — Assessment & Plan Note (Signed)
Currently at baseline. Head  CT f/u doesnot show any acute findings °- Delirium precautions °- Psych medications per psychiatry's orders ° °

## 2022-02-15 NOTE — TOC Progression Note (Signed)
Transition of Care (TOC) - Progression Note  ? ? ?Patient Details  ?Name: Aleksander Edmiston ?MRN: [id number] ?Date of Birth: 1961/06/28 ? ?Transition of Care (TOC) CM/SW Contact  ?Diannie Willner A Sharena Dibenedetto, LCSW ?Phone Number: ?02/15/2022, 11:22 AM ? ?Clinical Narrative:   CSW checked in with [facility] with [facility] and they have no new information to offer. It has been confirmed by her as well that pt is still on the list at [facility], [place], and [place]. ? ? ? ?Expected Discharge Plan: Group Home ?Barriers to Discharge: Continued Medical Work up ? ?Expected Discharge Plan and Services ?Expected Discharge Plan: Group Home ?In-house Referral: Clinical Social Work ?  ?  ?Living arrangements for the past 2 months: Group Home ?                ?  ?  ?  ?  ?  ?  ?  ?  ?  ?  ? ? ?Social Determinants of Health (SDOH) Interventions ?  ? ?Readmission Risk Interventions ?No flowsheet data found. ? ?

## 2022-02-15 NOTE — Progress Notes (Signed)
?Progress Note ? ? ?Patient: Adam [name] [id number] DOB: [birth year] DOA: 07/20/2021     87 ?DOS: the patient was seen and examined on 02/15/2022 ?  ?Brief hospital course: ?61 yo WM with hx of schizophrenia and TBI, who was initially admitted to [facility] ER on July 20, 2021. He spent more than 123 days in the [facility] ED awaiting psychiatric placement.  He was initially brought to [facility] ER due to violent outbursts at his group home.  He was IVC'd initially.  He was noted to be lethargic 12/19, found to have NSTEMI.  Pt was admitted to hospitalist service.  Cardiology consulted who recommended no intervention.   ?After admission to the medical service patient became severely agitated on the floor.  Pt required to ICU night of 12/24 for precedex gtt due to severe agitation and combative behavior.  PCCM was consulted.  Precedex felt to be ineffective and was turned off.  Pt was transferred back to floor on 1/4.  Hospital course remarkable for agitation, violent outbursts ?Psychiatry was consulted,making some medication changes.  They do not accept pt to inpatient psych here and recommend continued search for longer term placement.  Senior management involved.  Plan for placement on a group home. ? ?3/15-3/16-difficult placement ? ? ? ? ?Assessment and Plan: ?* Schizophrenia, chronic condition with acute exacerbation ([place]) ?Psychiatry was following him here due to ongoing safety issues related to patient's behavioral disturbances.  ?- Continue medication as per psychiatric recommendation ?- No inpatient psychiatric admission recommended ? ?Constipation ?Apparently he was refusing prior bowel regimen. ?He agreed to take MiraLAX with me but later requesting milk of magnesia from nursing staff. ?-Continue with bowel regimen-will do milk of magnesia as needed ? ?Elevated CK ?Elevated CK most likely due to IM injections as per psych, less likely NMS.  ?- No changes in the current regimen. ? ? ?Stroke [facility]) ?Hx of stroke.  Seems to  have significant aphasia, otherwise no apparent deficits. ?- Continue ASA, statin ? ?Acute metabolic encephalopathy ?Currently at baseline. Head  CT f/u doesnot show any acute findings ?- Delirium precautions ?- Psych medications per psychiatry's orders. ? ? ?NSTEMI (non-ST elevated myocardial infarction) ([place]) ?Previously this admission. ? ? ?DNR (do not resuscitate)/DNI(Do Not Intubate) ?EDP verified pt's DNR/DNI status with his mother (Adam [name]). ? ?Myocardial infarction [facility]) ?NSTEMI with elevated troponin (peaked to 23273), EKG with Q waves.Treated q 48 hrs heparin gtt ?- Continue aspirin, atorvastatin, metoprolol ?- Invasive evaluation deferred per cardiology.  ?- Monitoring for anginal signs or symptoms which are currently resolved ? 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  Becoming very agitated and aggressive at times. ?- Pursuing placement which is complicated. TOC and leadership involved in challenging disposition ? ?Acute pulmonary edema (HCC)-resolved as of 01/31/2022 ?CXR with mild cardiomegaly with diffuse interstitial opacities suggesting pulm edema - atypical/viral infection not excluded. Negative influenza/covid. This was likely due to NSTEMI. ?- Resolved. ? ? ? ? ?  ? ?Subjective: Sleeping comfortably.  Sitter at bedside reporting that he was agitated last night requiring medication ? ?Physical Exam: ?Vitals:  ? 02/14/22 1943 02/14/22 2359 02/15/22 [id number] 02/15/22 1045  ?BP: 126/89 (!) 138/97 114/76 (!) 126/92  ?Pulse: 98 95 75 85  ?Resp: 17 18 16 16   ?Temp: 98.2 ?F (36.8 ?C) 98.4 ?F (36.9 ?C) 98.3 ?F (36.8 ?C)   ?TempSrc:  Oral    ?SpO2: 97% 97% 97% 97%  ?Weight:      ?Height:      ? ?  General. ????In no acute distress. ?Pulmonary.??Lungs clear bilaterally, normal respiratory effort. ?CV. ?Regular rate and rhythm, no JVD, rub or murmur. ?Abdomen.??Soft, nontender, nondistended, BS positive. ?CNS.? ?No focal neurologic deficit.  Sleepy ?Extremities.??No  edema, no cyanosis, pulses intact and symmetrical. ?Psychiatry.??Judgment and insight appears impaired ? ?Data Reviewed: ? ?There are no new results to review at this time. ? ?Family Communication: None today ? ?Disposition: ?Status is: Inpatient ? ?Remains inpatient appropriate because: Difficult placement, medically stable for discharge ? ? ? ? Planned Discharge Destination: Barriers to discharge: Difficult placement.  TOC aware and working ? ? ? DVT prophylaxis-SCDs ?Time spent: 15 minutes ? ?Author: Max Sane, MD ?02/15/2022 11:08 AM ? ?For on call review [email or web address].  ?

## 2022-02-15 NOTE — Assessment & Plan Note (Signed)
Apparently he was refusing prior bowel regimen. ?He agreed to take MiraLAX with me but later requesting milk of magnesia from nursing staff. ?-Continue with bowel regimen-will do milk of magnesia as needed ?

## 2022-02-15 NOTE — Assessment & Plan Note (Signed)
EDP verified pt's DNR/DNI status with his mother ([place] L[place]ford) °

## 2022-02-15 NOTE — Assessment & Plan Note (Signed)
NSTEMI with elevated troponin (peaked to 23273), EKG with Q waves.Treated q 48 hrs heparin gtt °- Continue aspirin, atorvastatin, metoprolol °- Invasive evaluation deferred per cardiology.  °- Monitoring for anginal signs or symptoms which are currently resolved. °

## 2022-02-15 NOTE — Assessment & Plan Note (Signed)
Hx of stroke.  Seems to have significant aphasia, otherwise no apparent deficits. ?- Continue ASA, statin ?

## 2022-02-15 NOTE — Assessment & Plan Note (Signed)
Chronic.  Becoming very agitated and aggressive at times. °- Pursuing placement which is complicated. TOC and leadership involved in challenging disposition. °

## 2022-02-15 NOTE — Plan of Care (Signed)
  Problem: Activity: Goal: Risk for activity intolerance will decrease Outcome: Progressing   Problem: Nutrition: Goal: Adequate nutrition will be maintained Outcome: Progressing   Problem: Coping: Goal: Level of anxiety will decrease Outcome: Progressing   Problem: Pain Managment: Goal: General experience of comfort will improve Outcome: Progressing   Problem: Safety: Goal: Ability to remain free from injury will improve Outcome: Progressing   

## 2022-02-15 NOTE — Assessment & Plan Note (Signed)
Psychiatry was following him here due to ongoing safety issues related to patient's behavioral disturbances.  °- Continue medication as per psychiatric recommendation °- No inpatient psychiatric admission recommended. °

## 2022-02-16 DIAGNOSIS — G9341 Metabolic encephalopathy: Secondary | ICD-10-CM | POA: Diagnosis not present

## 2022-02-16 DIAGNOSIS — F209 Schizophrenia, unspecified: Secondary | ICD-10-CM | POA: Diagnosis not present

## 2022-02-16 DIAGNOSIS — J810 Acute pulmonary edema: Secondary | ICD-10-CM | POA: Diagnosis not present

## 2022-02-16 DIAGNOSIS — G3189 Other specified degenerative diseases of nervous system: Secondary | ICD-10-CM | POA: Diagnosis not present

## 2022-02-16 MED ORDER — CLOZAPINE 100 MG PO TABS
350.0000 mg | ORAL_TABLET | Freq: Every day | ORAL | Status: DC
  Administered 2022-02-16 – 2022-03-01 (×14): 350 mg via ORAL
  Filled 2022-02-16 (×14): qty 4

## 2022-02-16 NOTE — Assessment & Plan Note (Signed)
Hx of stroke.  Seems to have significant aphasia, otherwise no apparent deficits. ?- Continue ASA, statin ?

## 2022-02-16 NOTE — Assessment & Plan Note (Signed)
Previously this admission. ? ?

## 2022-02-16 NOTE — Assessment & Plan Note (Signed)
Elevated CK most likely due to IM injections as per psych, less likely NMS.  ?- No changes in the current regimen. ? ?

## 2022-02-16 NOTE — Assessment & Plan Note (Signed)
NSTEMI with elevated troponin (peaked to 23273), EKG with Q waves.Treated q 48 hrs heparin gtt °- Continue aspirin, atorvastatin, metoprolol °- Invasive evaluation deferred per cardiology.  °- Monitoring for anginal signs or symptoms which are currently resolved. °

## 2022-02-16 NOTE — Assessment & Plan Note (Signed)
EDP verified pt's DNR/DNI status with his mother ([place]ia Mignano) °

## 2022-02-16 NOTE — Assessment & Plan Note (Signed)
Chronic.  Becoming very agitated and aggressive at times. °- Pursuing placement which is complicated. TOC and leadership involved in challenging disposition. °

## 2022-02-16 NOTE — Assessment & Plan Note (Signed)
Currently at baseline. Head  CT f/u doesnot show any acute findings °- Delirium precautions °- Psych medications per psychiatry's orders ° °

## 2022-02-16 NOTE — Progress Notes (Signed)
?Progress Note ? ? ?Patient: Adam [name] EXB:[id number] DOB: 07-10-1961 DOA: 07/20/2021     88 ?DOS: the patient was seen and examined on 02/16/2022 ?  ?Brief hospital course: ?61 yo WM with hx of schizophrenia and TBI, who was initially admitted to [facility] ER on July 20, 2021. He spent more than 123 days in the [facility] ED awaiting psychiatric placement.  He was initially brought to [facility] ER due to violent outbursts at his group home.  He was IVC'd initially.  He was noted to be lethargic 12/19, found to have NSTEMI.  Pt was admitted to hospitalist service.  Cardiology consulted who recommended no intervention.   ?After admission to the medical service patient became severely agitated on the floor.  Pt required to ICU night of 12/24 for precedex gtt due to severe agitation and combative behavior.  PCCM was consulted.  Precedex felt to be ineffective and was turned off.  Pt was transferred back to floor on 1/4.  Hospital course remarkable for agitation, violent outbursts ?Psychiatry was consulted,making some medication changes.  They do not accept pt to inpatient psych here and recommend continued search for longer term placement.  Senior management involved.  Plan for placement on a group home. ? ?3/15-3/17-difficult placement ? ? ? ? ?Assessment and Plan: ?* Schizophrenia, chronic condition with acute exacerbation (HCC) ?Psychiatry was following him here due to ongoing safety issues related to patient's behavioral disturbances.  ?- Continue medication as per psychiatric recommendation ?- No inpatient psychiatric admission recommended ? ?Constipation ?Apparently he was refusing prior bowel regimen. ?He agreed to take MiraLAX with me but later requesting milk of magnesia from nursing staff. ?-Continue with bowel regimen-will do milk of magnesia as needed ? ?Elevated CK ?Elevated CK most likely due to IM injections as per psych, less likely NMS.  ?- No changes in the current regimen. ? ? ?Stroke [facility]) ?Hx of stroke.  Seems to  have significant aphasia, otherwise no apparent deficits. ?- Continue ASA, statin ? ?Acute metabolic encephalopathy ?Currently at baseline. Head  CT f/u doesnot show any acute findings ?- Delirium precautions ?- Psych medications per psychiatry's orders. ? ? ?NSTEMI (non-ST elevated myocardial infarction) (HCC) ?Previously this admission. ? ? ?DNR (do not resuscitate)/DNI(Do Not Intubate) ?EDP verified pt's DNR/DNI status with his mother ([place]). ? ?Myocardial infarction [facility]) ?NSTEMI with elevated troponin (peaked to 23273), EKG with Q waves.Treated q 48 hrs heparin gtt ?- Continue aspirin, atorvastatin, metoprolol ?- Invasive evaluation deferred per cardiology.  ?- Monitoring for anginal signs or symptoms which are currently resolved ? 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  Becoming very agitated and aggressive at times. ?- Pursuing placement which is complicated. TOC and leadership involved in challenging disposition ? ?Acute pulmonary edema (HCC)-resolved as of 01/31/2022 ?CXR with mild cardiomegaly with diffuse interstitial opacities suggesting pulm edema - atypical/viral infection not excluded. Negative influenza/covid. This was likely due to NSTEMI. ?- Resolved. ? ? ? ? ?  ? ?Subjective: Sleeping.  Sitter at bedside ? ?Physical Exam: ?Vitals:  ? 02/15/22 1045 02/15/22 1602 02/16/22 0229 02/16/22 1123  ?BP: (!) 126/92 131/86  100/69  ?Pulse: 85 100  88  ?Resp: 16 18  20   ?Temp:  98 ?F (36.7 ?C)  98.3 ?F (36.8 ?C)  ?TempSrc:  Oral  Oral  ?SpO2: 97% 95%  96%  ?Weight:   85.8 kg   ?Height:      ? ?General. ????In no acute distress. ?Pulmonary.??Lungs clear bilaterally, normal respiratory effort. ?CV. ?Regular  rate and rhythm, no JVD, rub or murmur. ?Abdomen.??Soft, nontender, nondistended, BS positive. ?CNS.???No focal neurologic deficit.  Sleepy ?Extremities.??No edema, no cyanosis, pulses intact and symmetrical. ?Psychiatry.??Judgment and insight appears  impaired ?Data Reviewed: ? ?There are no new results to review at this time. ? ?Family Communication: None ? ?Disposition: ?Status is: Inpatient ? ?Remains inpatient appropriate because: Medically stable.  Placement issue ? ? ? ? Planned Discharge Destination: Barriers to discharge: No safe placement ? ? ? ?Time spent: 15 minutes ? ?Author: ?Delfino Lovett, MD ?02/16/2022 1:32 PM ? ?For on call review [email or web address].  ?

## 2022-02-16 NOTE — Assessment & Plan Note (Signed)
Psychiatry was following him here due to ongoing safety issues related to patient's behavioral disturbances.  °- Continue medication as per psychiatric recommendation °- No inpatient psychiatric admission recommended. °

## 2022-02-16 NOTE — Assessment & Plan Note (Signed)
Apparently he was refusing prior bowel regimen. ?He agreed to take MiraLAX with me but later requesting milk of magnesia from nursing staff. ?-Continue with bowel regimen-will do milk of magnesia as needed ?

## 2022-02-17 DIAGNOSIS — G9341 Metabolic encephalopathy: Secondary | ICD-10-CM | POA: Diagnosis not present

## 2022-02-17 DIAGNOSIS — G3189 Other specified degenerative diseases of nervous system: Secondary | ICD-10-CM | POA: Diagnosis not present

## 2022-02-17 DIAGNOSIS — F209 Schizophrenia, unspecified: Secondary | ICD-10-CM | POA: Diagnosis not present

## 2022-02-17 DIAGNOSIS — J810 Acute pulmonary edema: Secondary | ICD-10-CM | POA: Diagnosis not present

## 2022-02-17 NOTE — Assessment & Plan Note (Signed)
Apparently he was refusing prior bowel regimen. ?He agreed to take MiraLAX with me but later requesting milk of magnesia from nursing staff. ?-Continue with bowel regimen-will do milk of magnesia as needed ?

## 2022-02-17 NOTE — Assessment & Plan Note (Signed)
Hx of stroke.  Seems to have significant aphasia, otherwise no apparent deficits. ?- Continue ASA, statin ?

## 2022-02-17 NOTE — Assessment & Plan Note (Signed)
Currently at baseline. Head  CT f/u doesnot show any acute findings °- Delirium precautions °- Psych medications per psychiatry's orders ° °

## 2022-02-17 NOTE — Assessment & Plan Note (Signed)
NSTEMI with elevated troponin (peaked to 23273), EKG with Q waves.Treated q 48 hrs heparin gtt °- Continue aspirin, atorvastatin, metoprolol °- Invasive evaluation deferred per cardiology.  °- Monitoring for anginal signs or symptoms which are currently resolved. °

## 2022-02-17 NOTE — Assessment & Plan Note (Signed)
Chronic.  Becoming very agitated and aggressive at times. °- Pursuing placement which is complicated. TOC and leadership involved in challenging disposition. °

## 2022-02-17 NOTE — Assessment & Plan Note (Signed)
EDP verified pt's DNR/DNI status with his mother ([place] Anglemyer) °

## 2022-02-17 NOTE — Plan of Care (Signed)
  Problem: Health Behavior/Discharge Planning: Goal: Ability to manage health-related needs will improve Outcome: Progressing   

## 2022-02-17 NOTE — Progress Notes (Signed)
?Progress Note ? ? ?Patient: Adam [name] WYO:[id number] DOB: [birth year] DOA: 07/20/2021     89 ?DOS: the patient was seen and examined on 02/17/2022 ?  ?Brief hospital course: ?61 yo WM with hx of schizophrenia and TBI, who was initially admitted to [facility] ER on July 20, 2021. He spent more than 123 days in the [facility] ED awaiting psychiatric placement.  He was initially brought to [facility] ER due to violent outbursts at his group home.  He was IVC'd initially.  He was noted to be lethargic 12/19, found to have NSTEMI.  Pt was admitted to hospitalist service.  Cardiology consulted who recommended no intervention.   ?After admission to the medical service patient became severely agitated on the floor.  Pt required to ICU night of 12/24 for precedex gtt due to severe agitation and combative behavior.  PCCM was consulted.  Precedex felt to be ineffective and was turned off.  Pt was transferred back to floor on 1/4.  Hospital course remarkable for agitation, violent outbursts ?Psychiatry was consulted,making some medication changes.  They do not accept pt to inpatient psych here and recommend continued search for longer term placement.  Senior management involved.  Plan for placement on a group home. ? ?3/15-3/18-difficult placement ? ? ? ? ?Assessment and Plan: ?* Schizophrenia, chronic condition with acute exacerbation (HCC) ?Psychiatry was following him here due to ongoing safety issues related to patient's behavioral disturbances.  ?- Continue medication as per psychiatric recommendation ?- No inpatient psychiatric admission recommended ? ?Constipation ?Apparently he was refusing prior bowel regimen. ?He agreed to take MiraLAX with me but later requesting milk of magnesia from nursing staff. ?-Continue with bowel regimen-will do milk of magnesia as needed ? ?Elevated CK ?Elevated CK most likely due to IM injections as per psych, less likely NMS.  ?- No changes in the current regimen. ? ? ?Stroke [facility]) ?Hx of stroke.  Seems to  have significant aphasia, otherwise no apparent deficits. ?- Continue ASA, statin ? ?Acute metabolic encephalopathy ?Currently at baseline. Head  CT f/u doesnot show any acute findings ?- Delirium precautions ?- Psych medications per psychiatry's orders. ? ? ?NSTEMI (non-ST elevated myocardial infarction) (HCC) ?Previously this admission. ? ? ?DNR (do not resuscitate)/DNI(Do Not Intubate) ?EDP verified pt's DNR/DNI status with his mother ([place]). ? ?Myocardial infarction [facility]) ?NSTEMI with elevated troponin (peaked to 23273), EKG with Q waves.Treated q 48 hrs heparin gtt ?- Continue aspirin, atorvastatin, metoprolol ?- Invasive evaluation deferred per cardiology.  ?- Monitoring for anginal signs or symptoms which are currently resolved ? 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  Becoming very agitated and aggressive at times. ?- Pursuing placement which is complicated. TOC and leadership involved in challenging disposition ? ?Acute pulmonary edema (HCC)-resolved as of 01/31/2022 ?CXR with mild cardiomegaly with diffuse interstitial opacities suggesting pulm edema - atypical/viral infection not excluded. Negative influenza/covid. This was likely due to NSTEMI. ?- Resolved. ? ? ? ? ?  ? ?Subjective: Awake and showing his abdomen -upon asking he is referring to constipation ? ?Physical Exam: ?Vitals:  ? 02/15/22 1602 02/16/22 0229 02/16/22 1123 02/17/22 1119  ?BP: 131/86  100/69 (!) 148/87  ?Pulse: 100  88 (!) 106  ?Resp: 18  20 17   ?Temp: 98 ?F (36.7 ?C)  98.3 ?F (36.8 ?C) 97.7 ?F (36.5 ?C)  ?TempSrc: Oral  Oral   ?SpO2: 95%  96% 94%  ?Weight:  85.8 kg    ?Height:      ? ?General. ????In  no acute distress. ?Pulmonary.??Lungs clear bilaterally, normal respiratory effort. ?CV. ?Regular rate and rhythm, no JVD, rub or murmur. ?Abdomen.??Soft, nontender, nondistended, BS positive. ?CNS.???No focal neurologic deficit.? Awake ?Extremities.??No edema, no cyanosis, pulses intact  and symmetrical. ?Psychiatry.??Judgment and insight appears impaired ? ?Data Reviewed: ? ?There are no new results to review at this time. ? ?Family Communication: None ? ?Disposition: ?Status is: Inpatient ? ?Remains inpatient appropriate because: Needs placement ? ? ? ? Planned Discharge Destination: Barriers to discharge: Difficult placement ? ? ? DVT prophylaxis-SCDs ?Time spent: 15 minutes ? ?Author: ?Delfino Lovett, MD ?02/17/2022 11:50 AM ? ?For on call review [email or web address].  ?

## 2022-02-17 NOTE — Plan of Care (Signed)
?  Problem: Education: ?Goal: Knowledge of General Education information will improve ?Description: Including pain rating scale, medication(s)/side effects and non-pharmacologic comfort measures ?Outcome: Progressing ?  ?Problem: Health Behavior/Discharge Planning: ?Goal: Ability to manage health-related needs will improve ?Outcome: Progressing ?  ?Problem: Clinical Measurements: ?Goal: Ability to maintain clinical measurements within normal limits will improve ?Outcome: Progressing ?Goal: Will remain free from infection ?Outcome: Progressing ?Goal: Diagnostic test results will improve ?Outcome: Progressing ?  ?Problem: Activity: ?Goal: Risk for activity intolerance will decrease ?Outcome: Progressing ?  ?Problem: Nutrition: ?Goal: Adequate nutrition will be maintained ?Outcome: Progressing ?  ?Problem: Coping: ?Goal: Level of anxiety will decrease ?Outcome: Progressing ?  ?Problem: Elimination: ?Goal: Will not experience complications related to bowel motility ?Outcome: Progressing ?Goal: Will not experience complications related to urinary retention ?Outcome: Progressing ?  ?Problem: Pain Managment: ?Goal: General experience of comfort will improve ?Outcome: Progressing ?  ?Problem: Safety: ?Goal: Ability to remain free from injury will improve ?Outcome: Progressing ?  ?Problem: Skin Integrity: ?Goal: Risk for impaired skin integrity will decrease ?Outcome: Progressing ?  ?Problem: Education: ?Goal: Emotional status will improve ?Outcome: Progressing ?  ?Problem: Activity: ?Goal: Interest or engagement in activities will improve ?Outcome: Progressing ?Goal: Sleeping patterns will improve ?Outcome: Progressing ?  ?Problem: Coping: ?Goal: Ability to verbalize frustrations and anger appropriately will improve ?Outcome: Progressing ?Goal: Ability to demonstrate self-control will improve ?Outcome: Progressing ?  ?Problem: Health Behavior/Discharge Planning: ?Goal: Compliance with treatment plan for underlying cause of  condition will improve ?Outcome: Progressing ?  ?Problem: Physical Regulation: ?Goal: Ability to maintain clinical measurements within normal limits will improve ?Outcome: Progressing ?  ?Problem: Safety: ?Goal: Periods of time without injury will increase ?Outcome: Progressing ?  ?

## 2022-02-17 NOTE — Assessment & Plan Note (Signed)
Psychiatry was following him here due to ongoing safety issues related to patient's behavioral disturbances.  °- Continue medication as per psychiatric recommendation °- No inpatient psychiatric admission recommended. °

## 2022-02-18 ENCOUNTER — Inpatient Hospital Stay: Payer: Medicare HMO

## 2022-02-18 DIAGNOSIS — G3189 Other specified degenerative diseases of nervous system: Secondary | ICD-10-CM | POA: Diagnosis not present

## 2022-02-18 DIAGNOSIS — J690 Pneumonitis due to inhalation of food and vomit: Secondary | ICD-10-CM | POA: Diagnosis not present

## 2022-02-18 DIAGNOSIS — F209 Schizophrenia, unspecified: Secondary | ICD-10-CM | POA: Diagnosis not present

## 2022-02-18 DIAGNOSIS — G9341 Metabolic encephalopathy: Secondary | ICD-10-CM | POA: Diagnosis not present

## 2022-02-18 DIAGNOSIS — J810 Acute pulmonary edema: Secondary | ICD-10-CM | POA: Diagnosis not present

## 2022-02-18 LAB — URINALYSIS, COMPLETE (UACMP) WITH MICROSCOPIC
Bacteria, UA: NONE SEEN
Bilirubin Urine: NEGATIVE
Glucose, UA: NEGATIVE mg/dL
Hgb urine dipstick: NEGATIVE
Ketones, ur: NEGATIVE mg/dL
Leukocytes,Ua: NEGATIVE
Nitrite: NEGATIVE
Protein, ur: NEGATIVE mg/dL
Specific Gravity, Urine: 1.026 (ref 1.005–1.030)
Squamous Epithelial / HPF: NONE SEEN (ref 0–5)
pH: 5 (ref 5.0–8.0)

## 2022-02-18 MED ORDER — GUAIFENESIN-DM 100-10 MG/5ML PO SYRP
5.0000 mL | ORAL_SOLUTION | ORAL | Status: DC | PRN
  Administered 2022-02-18 (×2): 5 mL via ORAL
  Filled 2022-02-18 (×2): qty 5

## 2022-02-18 MED ORDER — AMOXICILLIN-POT CLAVULANATE 875-125 MG PO TABS
1.0000 | ORAL_TABLET | Freq: Two times a day (BID) | ORAL | Status: DC
Start: 2022-02-18 — End: 2022-02-19
  Administered 2022-02-18 – 2022-02-19 (×3): 1 via ORAL
  Filled 2022-02-18 (×3): qty 1

## 2022-02-18 NOTE — Progress Notes (Signed)
Pt soiled the bed and refused to let us change his clothes or the bed. He has a fever of 100.3 F and a cough. I offered him Tylenol, Robitussin, and Haldol (for his agitation). He yelled no over and over for about 15 min. before taking the medications. But he continued to refuse to change his clothes or allowed the bed to be changed. ?

## 2022-02-18 NOTE — Assessment & Plan Note (Signed)
EDP verified pt's DNR/DNI status with his mother ([place]) °

## 2022-02-18 NOTE — Assessment & Plan Note (Signed)
NSTEMI with elevated troponin (peaked to 23273), EKG with Q waves.Treated q 48 hrs heparin gtt °- Continue aspirin, atorvastatin, metoprolol °- Invasive evaluation deferred per cardiology.  °- Monitoring for anginal signs or symptoms which are currently resolved. °

## 2022-02-18 NOTE — Assessment & Plan Note (Signed)
Apparently he was refusing prior bowel regimen. ?He agreed to take MiraLAX with me but later requesting milk of magnesia from nursing staff. ?-Continue with bowel regimen-will do milk of magnesia as needed ?

## 2022-02-18 NOTE — Assessment & Plan Note (Signed)
Elevated CK most likely due to IM injections as per psych, less likely NMS.  ?- No changes in the current regimen. ? ?

## 2022-02-18 NOTE — Assessment & Plan Note (Signed)
Currently at baseline. Head  CT f/u doesnot show any acute findings °- Delirium precautions °- Psych medications per psychiatry's orders ° °

## 2022-02-18 NOTE — Progress Notes (Signed)
?Progress Note ? ? ?Patient: Adam [name] [id number] DOB: 21-Feb-1961 DOA: 07/20/2021     90 ?DOS: the patient was seen and examined on 02/18/2022 ?  ?Brief hospital course: ?61 yo WM with hx of schizophrenia and TBI, who was initially admitted to [facility] ER on July 20, 2021. He spent more than 123 days in the [facility] ED awaiting psychiatric placement.  He was initially brought to [facility] ER due to violent outbursts at his group home.  He was IVC'd initially.  He was noted to be lethargic 12/19, found to have NSTEMI.  Pt was admitted to hospitalist service.  Cardiology consulted who recommended no intervention.   ?After admission to the medical service patient became severely agitated on the floor.  Pt required to ICU night of 12/24 for precedex gtt due to severe agitation and combative behavior.  PCCM was consulted.  Precedex felt to be ineffective and was turned off.  Pt was transferred back to floor on 1/4.  Hospital course remarkable for agitation, violent outbursts ?Psychiatry was consulted,making some medication changes.  They do not accept pt to inpatient psych here and recommend continued search for longer term placement.  Senior management involved.  Plan for placement on a group home. ? ?3/15-3/18-difficult placement ?3/19: Tmax 102.8 earlier this morning.  Chest x-ray showing right lower lobe pneumonia.  Started oral Augmentin ? ? ? ?Assessment and Plan: ?* Schizophrenia, chronic condition with acute exacerbation ([place]) ?Psychiatry was following him here due to ongoing safety issues related to patient's behavioral disturbances.  ?- Continue medication as per psychiatric recommendation ?- No inpatient psychiatric admission recommended ? ?Aspiration pneumonia ([place]) ?Tmax 102.8 earlier this morning.  Chest x-ray showing right lower lobe pneumonia suggestive of aspiration pneumonia as he is on high-risk for recurrent aspiration.  Start oral Augmentin ? ?Constipation ?Apparently he was refusing prior bowel regimen. ?He  agreed to take MiraLAX with me but later requesting milk of magnesia from nursing staff. ?-Continue with bowel regimen-will do milk of magnesia as needed ? ?Elevated CK ?Elevated CK most likely due to IM injections as per psych, less likely NMS.  ?- No changes in the current regimen. ? ? ?Stroke [facility]) ?Hx of stroke.  Seems to have significant aphasia, otherwise no apparent deficits. ?- Continue ASA, statin ? ?Acute metabolic encephalopathy ?Currently at baseline. Head  CT f/u doesnot show any acute findings ?- Delirium precautions ?- Psych medications per psychiatry's orders. ? ? ?NSTEMI (non-ST elevated myocardial infarction) ([place]) ?Previously this admission. ? ? ?DNR (do not resuscitate)/DNI(Do Not Intubate) ?EDP verified pt's DNR/DNI status with his mother ([place]). ? ?Myocardial infarction [facility]) ?NSTEMI with elevated troponin (peaked to 23273), EKG with Q waves.Treated q 48 hrs heparin gtt ?- Continue aspirin, atorvastatin, metoprolol ?- Invasive evaluation deferred per cardiology.  ?- Monitoring for anginal signs or symptoms which are currently resolved ? 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  Becoming very agitated and aggressive at times. ?- Pursuing placement which is complicated. TOC and leadership involved in challenging disposition ? ?Acute pulmonary edema (HCC)-resolved as of 01/31/2022 ?CXR with mild cardiomegaly with diffuse interstitial opacities suggesting pulm edema - atypical/viral infection not excluded. Negative influenza/covid. This was likely due to NSTEMI. ?- Resolved. ? ? ? ? ?  ? ?Subjective: Fever of 102.8 earlier this morning with tachycardia. ? ?Physical Exam: ?Vitals:  ? 02/18/22 0718 02/18/22 0721 02/18/22 1100 02/18/22 1112  ?BP:  102/70    ?Pulse:  66 (!) 115   ?Resp:  18    ?  Temp: (!) 102.8 ?F (39.3 ?C)   99.1 ?F (37.3 ?C)  ?TempSrc: Oral   Oral  ?SpO2:  91% 95%   ?Weight:      ?Height:      ? ?General. ????In no acute  distress. ?Pulmonary.??Rhonchi in the right lower lobe there is ?CV. ?Regular rate and rhythm, no JVD, rub or murmur. ?Abdomen.??Soft, nontender, nondistended, BS positive. ?CNS.???No focal neurologic deficit.? Awake ?Extremities.??No edema, no cyanosis, pulses intact and symmetrical. ?Psychiatry.??Judgment and insight appears impaired ? ?Data Reviewed: ? ?Chest x-ray shows right lower lobe consolidation ? ?Family Communication: None ? ?Disposition: ?Status is: Inpatient ? ?Remains inpatient appropriate because: Waiting for placement ? ? ? ? Planned Discharge Destination: Barriers to discharge: Difficult placement ? ? ? DVT prophylaxis-SCDs ?Time spent: 20 minutes ? ?Author: ?Max Sane, MD ?02/18/2022 12:18 PM ? ?For on call review [email or web address].

## 2022-02-18 NOTE — Plan of Care (Signed)
  Problem: Clinical Measurements: Goal: Ability to maintain clinical measurements within normal limits will improve Outcome: Progressing   

## 2022-02-18 NOTE — Assessment & Plan Note (Signed)
Previously this admission. ? ?

## 2022-02-18 NOTE — Assessment & Plan Note (Signed)
Hx of stroke.  Seems to have significant aphasia, otherwise no apparent deficits. ?- Continue ASA, statin ?

## 2022-02-18 NOTE — Assessment & Plan Note (Signed)
Chronic.  Becoming very agitated and aggressive at times. °- Pursuing placement which is complicated. TOC and leadership involved in challenging disposition. °

## 2022-02-18 NOTE — Progress Notes (Signed)
?   02/18/22 0718  ?Assess: MEWS Score  ?Temp (!) 102.8 ?F (39.3 ?C)  ?Assess: MEWS Score  ?MEWS Temp 2  ?MEWS Systolic 0  ?MEWS Pulse 1  ?MEWS RR 0  ?MEWS LOC 0  ?MEWS Score 3  ?MEWS Score Color Yellow  ?Assess: if the MEWS score is Yellow or Red  ?Were vital signs taken at a resting state? No  ?Focused Assessment Change from prior assessment (see assessment flowsheet)  ?Does the patient meet 2 or more of the SIRS criteria? Yes  ?Does the patient have a confirmed or suspected source of infection? No  ?MEWS guidelines implemented *See Row Information* No, previously yellow, continue vital signs every 4 hours  ?Notify: Charge Nurse/RN  ?Name of Charge Nurse/RN Notified Engineer, manufacturing  ?Date Charge Nurse/RN Notified 02/18/22  ?Time Charge Nurse/RN Notified [phone]  ?Notify: Provider  ?Provider Name/Title Sherryll Burger, Vipul  ?Date Provider Notified 02/18/22  ?Time Provider Notified [phone]  ?Notification Type Page  ?Notification Reason Change in status  ?Assess: SIRS CRITERIA  ?SIRS Temperature  1  ?SIRS Pulse 1  ?SIRS Respirations  0  ?SIRS WBC 0  ?SIRS Score Sum  2  ? ?Pt went back to SIRS 1 at 0721.  Will recheck Temp and execute any new orders from MD.   ?

## 2022-02-18 NOTE — Assessment & Plan Note (Signed)
Tmax 102.8 earlier this morning.  Chest x-ray showing right lower lobe pneumonia suggestive of aspiration pneumonia as he is on high-risk for recurrent aspiration.  Start oral Augmentin ?

## 2022-02-19 ENCOUNTER — Inpatient Hospital Stay: Payer: Medicare HMO

## 2022-02-19 DIAGNOSIS — F209 Schizophrenia, unspecified: Secondary | ICD-10-CM | POA: Diagnosis not present

## 2022-02-19 DIAGNOSIS — J810 Acute pulmonary edema: Secondary | ICD-10-CM | POA: Diagnosis not present

## 2022-02-19 DIAGNOSIS — G9341 Metabolic encephalopathy: Secondary | ICD-10-CM | POA: Diagnosis not present

## 2022-02-19 DIAGNOSIS — J690 Pneumonitis due to inhalation of food and vomit: Secondary | ICD-10-CM | POA: Diagnosis not present

## 2022-02-19 LAB — BASIC METABOLIC PANEL
Anion gap: 11 (ref 5–15)
BUN: 34 mg/dL — ABNORMAL HIGH (ref 8–23)
CO2: 22 mmol/L (ref 22–32)
Calcium: 8.7 mg/dL — ABNORMAL LOW (ref 8.9–10.3)
Chloride: 105 mmol/L (ref 98–111)
Creatinine, Ser: 1.09 mg/dL (ref 0.61–1.24)
GFR, Estimated: >60 mL/min (ref >60)
Glucose, Bld: 132 mg/dL — ABNORMAL HIGH (ref 70–99)
Potassium: 4.2 mmol/L (ref 3.5–5.1)
Sodium: 138 mmol/L (ref 135–145)

## 2022-02-19 LAB — RESPIRATORY PANEL BY PCR

## 2022-02-19 LAB — CBC
HCT: 39.7 % (ref 39.0–52.0)
Hemoglobin: 13.4 g/dL (ref 13.0–17.0)
MCH: 31.5 pg (ref 26.0–34.0)
MCHC: 33.8 g/dL (ref 30.0–36.0)
MCV: 93.2 fL (ref 80.0–100.0)
Platelets: 162 10*3/uL (ref 150–400)
RBC: 4.26 MIL/uL (ref 4.22–5.81)
RDW: 12.9 % (ref 11.5–15.5)
WBC: 3.8 10*3/uL — ABNORMAL LOW (ref 4.0–10.5)
nRBC: 0 % (ref 0.0–0.2)

## 2022-02-19 LAB — GASTROINTESTINAL PANEL BY PCR, STOOL (REPLACES STOOL CULTURE)

## 2022-02-19 MED ORDER — LOPERAMIDE HCL 2 MG PO CAPS
2.0000 mg | ORAL_CAPSULE | ORAL | Status: DC | PRN
  Administered 2022-02-19: 2 mg via ORAL
  Filled 2022-02-19: qty 1

## 2022-02-19 MED ORDER — LORAZEPAM 2 MG/ML IJ SOLN
2.0000 mg | INTRAMUSCULAR | Status: DC | PRN
  Administered 2022-02-20: 2 mg via INTRAMUSCULAR
  Filled 2022-02-19: qty 1

## 2022-02-19 MED ORDER — AMOXICILLIN-POT CLAVULANATE 875-125 MG PO TABS
1.0000 | ORAL_TABLET | Freq: Two times a day (BID) | ORAL | Status: AC
  Administered 2022-02-19 – 2022-02-24 (×10): 1 via ORAL
  Filled 2022-02-19 (×10): qty 1

## 2022-02-19 MED ORDER — SENNOSIDES-DOCUSATE SODIUM 8.6-50 MG PO TABS: 2.0000 | ORAL_TABLET | Freq: Once | ORAL | Status: DC

## 2022-02-19 MED ORDER — DOCUSATE SODIUM 100 MG PO CAPS: 200.0000 mg | ORAL_CAPSULE | Freq: Two times a day (BID) | ORAL | Status: DC

## 2022-02-19 MED ORDER — LOPERAMIDE HCL 2 MG PO CAPS
2.0000 mg | ORAL_CAPSULE | Freq: Four times a day (QID) | ORAL | Status: DC | PRN
  Administered 2022-02-19 – 2022-02-20 (×3): 2 mg via ORAL
  Filled 2022-02-19 (×4): qty 1

## 2022-02-19 NOTE — Assessment & Plan Note (Signed)
Hx of stroke.  Seems to have significant aphasia, otherwise no apparent deficits. ?- Continue ASA, statin ?

## 2022-02-19 NOTE — Assessment & Plan Note (Signed)
Chronic.  Becoming very agitated and aggressive at times.  Today he was agitated and aggressive ?- Pursuing placement which is complicated. TOC and leadership involved in challenging disposition ?

## 2022-02-19 NOTE — Progress Notes (Signed)
?Progress Note ? ? ?Patient: Adam [name] [id number] DOB: [birth year] DOA: 07/20/2021     91 ?DOS: the patient was seen and examined on 02/19/2022 ?  ?Brief hospital course: ?61 yo WM with hx of schizophrenia and TBI, who was initially admitted to [facility] ER on July 20, 2021. He spent more than 123 days in the [facility] ED awaiting psychiatric placement.  He was initially brought to [facility] ER due to violent outbursts at his group home.  He was IVC'd initially.  He was noted to be lethargic 12/19, found to have NSTEMI.  Pt was admitted to hospitalist service.  Cardiology consulted who recommended no intervention.   ?After admission to the medical service patient became severely agitated on the floor.  Pt required to ICU night of 12/24 for precedex gtt due to severe agitation and combative behavior.  PCCM was consulted.  Precedex felt to be ineffective and was turned off.  Pt was transferred back to floor on 1/4.  Hospital course remarkable for agitation, violent outbursts ?Psychiatry was consulted,making some medication changes.  They do not accept pt to inpatient psych here and recommend continued search for longer term placement.  Senior management involved.  Plan for placement on a group home. ? ?3/15-3/18-difficult placement ?3/19: Tmax 102.8 earlier this morning.  Chest x-ray showing right lower lobe pneumonia.  Started oral Augmentin ?3/20-yelling and agitated this morning ? ? ?Assessment and Plan: ?* Schizophrenia, chronic condition with acute exacerbation ([place]) ?Psychiatry was following him here due to ongoing safety issues related to patient's behavioral disturbances.  ?- Continue medication as per psychiatric recommendation ?- No inpatient psychiatric admission recommended ? ?Aspiration pneumonia ([place]) ?Tmax 102.8 earlier this morning.  Chest x-ray showing right lower lobe pneumonia suggestive of aspiration pneumonia as he is on high-risk for recurrent aspiration.  Continue oral Augmentin for total 5 days.  Started  on 3/19 ? ?Constipation ?Apparently he was refusing prior bowel regimen. ?He agreed to take MiraLAX with me but later requesting milk of magnesia from nursing staff. ?-Continue with bowel regimen-will do milk of magnesia as needed ? ?Elevated CK ?Elevated CK most likely due to IM injections as per psych, less likely NMS.  ?- No changes in the current regimen. ? ? ?Stroke [facility]) ?Hx of stroke.  Seems to have significant aphasia, otherwise no apparent deficits. ?- Continue ASA, statin ? ?TBI (traumatic brain injury) ?Chronic ? ?Acute metabolic encephalopathy ?Currently at baseline. Head  CT f/u doesnot show any acute findings ?- Delirium precautions ?- Psych medications per psychiatry's orders. ? ? ?NSTEMI (non-ST elevated myocardial infarction) ([place]) ?Previously this admission. ? ? ?DNR (do not resuscitate)/DNI(Do Not Intubate) ?EDP verified pt's DNR/DNI status with his mother ([place]). ? ?Myocardial infarction [facility]) ?NSTEMI with elevated troponin (peaked to 23273), EKG with Q waves.Treated q 48 hrs heparin gtt ?- Continue aspirin, atorvastatin, metoprolol ?- Invasive evaluation deferred per cardiology.  ?- Monitoring for anginal signs or symptoms which are currently resolved ? 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  Becoming very agitated and aggressive at times.  Today he was agitated and aggressive ?- Pursuing placement which is complicated. TOC and leadership involved in challenging disposition ? ?Acute pulmonary edema (HCC)-resolved as of 01/31/2022 ?CXR with mild cardiomegaly with diffuse interstitial opacities suggesting pulm edema - atypical/viral infection not excluded. Negative influenza/covid. This was likely due to NSTEMI. ?- Resolved. ? ? ? ? ?  ? ?Subjective: Was agitated and yelling earlier.  He is pointing to his abdomen ? ?Physical Exam: ?  Vitals:  ? 02/18/22 1443 02/18/22 1839 02/19/22 0018 02/19/22 1038  ?BP:  (!) 92/56 122/76 118/88  ?Pulse:  94 (!)  110 (!) 126  ?Resp:  18 20 18   ?Temp: 99.3 ?F (37.4 ?C) 100.3 ?F (37.9 ?C) 98.4 ?F (36.9 ?C) 98.2 ?F (36.8 ?C)  ?TempSrc: Axillary Axillary Oral Oral  ?SpO2:  94% 96%   ?Weight:      ?Height:      ? ?General. ????In no acute distress. ?Pulmonary.??Rhonchi in the right lower lobe there is ?CV. ?Regular rate and rhythm, no JVD, rub or murmur. ?Abdomen.??Soft, nontender, nondistended, BS positive. ?CNS.???No focal neurologic deficit.??Awake ?Extremities.??No edema, no cyanosis, pulses intact and symmetrical. ?Psychiatry.??Judgment and insight appears impaired ? ?Data Reviewed: ? ?There are no new results to review at this time. ? ?Family Communication: None ? ?Disposition: ?Status is: Inpatient ? ?Remains inpatient appropriate because: Waiting for placement ? ? ? ? Planned Discharge Destination: Difficult placement ? ? ? DVT prophylaxis-SCD ?Time spent: 15 minutes ? ?Author: Max Sane, MD ?02/19/2022 11:24 AM ? ?For on call review [email or web address].  ?

## 2022-02-19 NOTE — Assessment & Plan Note (Signed)
EDP verified pt's DNR/DNI status with his mother ([place]) °

## 2022-02-19 NOTE — TOC Progression Note (Signed)
Transition of Care (TOC) - Progression Note  ? ? ?Patient Details  ?Name: Adam [name] ?MRN: [id number] ?Date of Birth: 06/13/1961 ? ?Transition of Care (TOC) CM/SW Contact  ?Dike Cellar, RN ?Phone Number: ?02/19/2022, 9:33 AM ? ?Clinical Narrative:    ?LVMM for Program manager Love Bruener [phone] requesting update on wait list for [facility], [place] and [place].  ? ? ?Expected Discharge Plan: Group Home ?Barriers to Discharge: Continued Medical Work up ? ?Expected Discharge Plan and Services ?Expected Discharge Plan: Group Home ?In-house Referral: Clinical Social Work ?  ?  ?Living arrangements for the past 2 months: Group Home ?                ?  ?  ?  ?  ?  ?  ?  ?  ?  ?  ? ? ?Social Determinants of Health (SDOH) Interventions ?  ? ?Readmission Risk Interventions ?No flowsheet data found. ? ?

## 2022-02-19 NOTE — Assessment & Plan Note (Signed)
Chronic. 

## 2022-02-19 NOTE — Progress Notes (Signed)
I witnessed patient jerk arm away when tech attempted to obtain vital signs. Will re-attempt when patient is calmer. ?

## 2022-02-19 NOTE — Assessment & Plan Note (Addendum)
Tmax 102.8 earlier this morning.  Chest x-ray showing right lower lobe pneumonia suggestive of aspiration pneumonia as he is on high-risk for recurrent aspiration.  Continue oral Augmentin for total 5 days.  Started on 3/19 ?

## 2022-02-19 NOTE — Progress Notes (Signed)
Pt is refusing most meds and vital signs.  Refused labs.  Pt very agitated.  Administered haldol IM.  Will continue to monitor ?

## 2022-02-19 NOTE — Assessment & Plan Note (Signed)
Psychiatry was following him here due to ongoing safety issues related to patient's behavioral disturbances.  °- Continue medication as per psychiatric recommendation °- No inpatient psychiatric admission recommended. °

## 2022-02-19 NOTE — Assessment & Plan Note (Signed)
Elevated CK most likely due to IM injections as per psych, less likely NMS.  ?- No changes in the current regimen. ? ?

## 2022-02-19 NOTE — Assessment & Plan Note (Signed)
Apparently he was refusing prior bowel regimen. ?He agreed to take MiraLAX with me but later requesting milk of magnesia from nursing staff. ?-Continue with bowel regimen-will do milk of magnesia as needed ?

## 2022-02-19 NOTE — Assessment & Plan Note (Signed)
Currently at baseline. Head  CT f/u doesnot show any acute findings °- Delirium precautions °- Psych medications per psychiatry's orders ° °

## 2022-02-19 NOTE — Assessment & Plan Note (Signed)
NSTEMI with elevated troponin (peaked to 23273), EKG with Q waves.Treated q 48 hrs heparin gtt °- Continue aspirin, atorvastatin, metoprolol °- Invasive evaluation deferred per cardiology.  °- Monitoring for anginal signs or symptoms which are currently resolved. °

## 2022-02-20 DIAGNOSIS — G9341 Metabolic encephalopathy: Secondary | ICD-10-CM | POA: Diagnosis not present

## 2022-02-20 DIAGNOSIS — F209 Schizophrenia, unspecified: Secondary | ICD-10-CM | POA: Diagnosis not present

## 2022-02-20 DIAGNOSIS — J690 Pneumonitis due to inhalation of food and vomit: Secondary | ICD-10-CM

## 2022-02-20 DIAGNOSIS — G3189 Other specified degenerative diseases of nervous system: Secondary | ICD-10-CM | POA: Diagnosis not present

## 2022-02-20 MED ORDER — SALINE SPRAY 0.65 % NA SOLN
1.0000 | NASAL | Status: DC | PRN
  Administered 2022-02-20: 1 via NASAL
  Filled 2022-02-20: qty 44

## 2022-02-20 NOTE — Assessment & Plan Note (Signed)
Chronic.  Becoming very agitated and aggressive at times.  Today he was agitated and aggressive so Ativan increased to 2 mg every 4 hours per psychiatry.  He seem to have responded ?- Pursuing placement which is complicated. TOC and leadership involved in challenging disposition ?

## 2022-02-20 NOTE — Assessment & Plan Note (Signed)
Tmax 102.8 on 3/19.  Chest x-ray showing right lower lobe pneumonia suggestive of aspiration pneumonia as he is on high-risk for recurrent aspiration.  Continue oral Augmentin for total 5 days.  Started on 3/19 ?

## 2022-02-20 NOTE — Plan of Care (Signed)
Patient is alert only to self. Frequently screams in room. Attempted to hit saff 2x last night and was given ativan for agitation/anxiety. Refused temperature check and last set of v/s. ?Problem: Clinical Measurements: ?Goal: Ability to maintain clinical measurements within normal limits will improve ?Outcome: Progressing ?Goal: Diagnostic test results will improve ?Outcome: Progressing ?  ?Problem: Activity: ?Goal: Risk for activity intolerance will decrease ?Outcome: Progressing ?  ?Problem: Nutrition: ?Goal: Adequate nutrition will be maintained ?Outcome: Progressing ?  ?Problem: Elimination: ?Goal: Will not experience complications related to bowel motility ?Outcome: Progressing ?Goal: Will not experience complications related to urinary retention ?Outcome: Progressing ?  ?Problem: Pain Managment: ?Goal: General experience of comfort will improve ?Outcome: Progressing ?  ?Problem: Safety: ?Goal: Ability to remain free from injury will improve ?Outcome: Progressing ?  ?Problem: Skin Integrity: ?Goal: Risk for impaired skin integrity will decrease ?Outcome: Progressing ?  ?Problem: Physical Regulation: ?Goal: Ability to maintain clinical measurements within normal limits will improve ?Outcome: Progressing ?  ?Problem: Safety: ?Goal: Periods of time without injury will increase ?Outcome: Progressing ?  ?

## 2022-02-20 NOTE — Progress Notes (Signed)
?Progress Note ? ? ?Patient: Adam [name] [id number] DOB: [birth year] DOA: 07/20/2021     92 ?DOS: the patient was seen and examined on 02/20/2022 ?  ?Brief hospital course: ?61 yo WM with hx of schizophrenia and TBI, who was initially admitted to [facility] ER on July 20, 2021. He spent more than 123 days in the [facility] ED awaiting psychiatric placement.  He was initially brought to [facility] ER due to violent outbursts at his group home.  He was IVC'd initially.  He was noted to be lethargic 12/19, found to have NSTEMI.  Pt was admitted to hospitalist service.  Cardiology consulted who recommended no intervention.   ?After admission to the medical service patient became severely agitated on the floor.  Pt required to ICU night of 12/24 for precedex gtt due to severe agitation and combative behavior.  PCCM was consulted.  Precedex felt to be ineffective and was turned off.  Pt was transferred back to floor on 1/4.  Hospital course remarkable for agitation, violent outbursts ?Psychiatry was consulted,making some medication changes.  They do not accept pt to inpatient psych here and recommend continued search for longer term placement.  Senior management involved.  Plan for placement on a group home. ? ?3/15-3/18-difficult placement ?3/19: Tmax 102.8 earlier this morning.  Chest x-ray showing right lower lobe pneumonia.  Started oral Augmentin ?3/20-yelling and agitated this morning ?3/21: Loose bowel movements ? ? ?Assessment and Plan: ?* Schizophrenia, chronic condition with acute exacerbation ([place]) ?Psychiatry is following him here due to ongoing safety issues related to patient's behavioral disturbances.  ?- Continue medication as per psychiatric recommendation.  Ativan increased to 2 mg every 4 hours due to his agitation spells last 2 days ?- No inpatient psychiatric admission recommended ? ?Aspiration pneumonia ([place]) ?Tmax 102.8 on 3/19.  Chest x-ray showing right lower lobe pneumonia suggestive of aspiration pneumonia as  he is on high-risk for recurrent aspiration.  Continue oral Augmentin for total 5 days.  Started on 3/19 ? ?Constipation ?Apparently he was refusing prior bowel regimen. ?-Continue with bowel regimen-will do milk of magnesia as needed.  KUB yesterday was within normal limits ?Also has Imodium ordered.  It is a difficult balance between nursing able to manage his frequency of loose stools and him able to cooperate.  He very well could have hard stool and water leakage around it ? ?Elevated CK ?Elevated CK most likely due to IM injections as per psych, less likely NMS.  ?- No changes in the current regimen. ? ? ?Stroke [facility]) ?Hx of stroke.  Seems to have significant aphasia, otherwise no apparent deficits. ?- Continue ASA, statin ? ?TBI (traumatic brain injury) ?Chronic ? ?Acute metabolic encephalopathy ?Currently at baseline. Head  CT f/u doesnot show any acute findings ?- Delirium precautions ?- Psych medications per psychiatry's orders. ? ? ?NSTEMI (non-ST elevated myocardial infarction) ([place]) ?elevated troponin (peaked to 23273), EKG with Q waves.Treated q 48 hrs heparin gtt ?- Continue aspirin, atorvastatin, metoprolol ?- Invasive evaluation deferred per cardiology.  ?- Monitoring for anginal signs or symptoms which are currently resolved ? ? ?DNR (do not resuscitate)/DNI(Do Not Intubate) ?EDP verified pt's DNR/DNI status with his mother ([place]). ? 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  Becoming very agitated and aggressive at times.  Today he was agitated and aggressive so Ativan increased to 2 mg every 4 hours per psychiatry.  He seem to have responded ?- Pursuing placement which is complicated. TOC and leadership involved in challenging disposition ? ?  Acute pulmonary edema (HCC)-resolved as of 01/31/2022 ?CXR with mild cardiomegaly with diffuse interstitial opacities suggesting pulm edema - atypical/viral infection not excluded. Negative influenza/covid. This  was likely due to NSTEMI. ?- Resolved. ? ?Myocardial infarction (HCC)-resolved as of 02/20/2022 ?NSTEMI with elevated troponin (peaked to 23273), EKG with Q waves.Treated q 48 hrs heparin gtt ?- Continue aspirin, atorvastatin, metoprolol ?- Invasive evaluation deferred per cardiology.  ?- Monitoring for anginal signs or symptoms which are currently resolved ? ? ? ? ?  ? ?Subjective: Sitting in the bed.  Calm today, sitter at bedside ? ?Physical Exam: ?Vitals:  ? 02/19/22 0018 02/19/22 1038 02/19/22 2211 02/20/22 1030  ?BP: 122/76 118/88 135/87 96/62  ?Pulse: (!) 110 (!) 126 (!) 105 100  ?Resp: 20 18 18 18   ?Temp: 98.4 ?F (36.9 ?C) 98.2 ?F (36.8 ?C)  98 ?F (36.7 ?C)  ?TempSrc: Oral Oral  Oral  ?SpO2: 96%  96% 93%  ?Weight:      ?Height:      ? ?General. ????In no acute distress. ?Pulmonary.??Rhonchi in the right lower lobe there is ?CV. ?Regular rate and rhythm, no JVD, rub or murmur. ?Abdomen.??Soft, nontender, nondistended, BS positive. ?CNS.???No focal neurologic deficit.??Awake ?Extremities.??No edema, no cyanosis, pulses intact and symmetrical. ?Psychiatry.??Judgment and insight appears impaired ? ?Data Reviewed: ? ? ?Normal KUB ? ?Family Communication: None ? ?Disposition: ?Status is: Inpatient ? ?Remains inpatient appropriate because: Difficult disposition ? ? ? ? Planned Discharge Destination: Barriers to discharge: No placement available ? ? ?DVT prophylaxis - SCDs ?Time spent: 15 minutes ? ?Author: ?Max Sane, MD ?02/20/2022 1:44 PM ? ?For on call review [email or web address].  ?

## 2022-02-20 NOTE — Progress Notes (Signed)
Patient's laundry taken to laundry room on behavioral health unit to be washed.  ?

## 2022-02-20 NOTE — Assessment & Plan Note (Signed)
Psychiatry is following him here due to ongoing safety issues related to patient's behavioral disturbances.  ?- Continue medication as per psychiatric recommendation.  Ativan increased to 2 mg every 4 hours due to his agitation spells last 2 days ?- No inpatient psychiatric admission recommended ?

## 2022-02-20 NOTE — Progress Notes (Signed)
Patient received ativan IM because of inability to verbally redirect and agitation/anxiety. Patient began swatting at staff trying to hit. Received IM ativan for anxiety to R-thigh.  ?

## 2022-02-20 NOTE — Assessment & Plan Note (Signed)
Hx of stroke.  Seems to have significant aphasia, otherwise no apparent deficits. ?- Continue ASA, statin ?

## 2022-02-20 NOTE — Progress Notes (Signed)
Patient has a blistered, red are to right wrist. It looks like maybe his bracelet (which he won't take off) has rubbed it. Area cleansed. Tegaderm drsg placed. MD made aware.  ?

## 2022-02-20 NOTE — Assessment & Plan Note (Signed)
Currently at baseline. Head  CT f/u doesnot show any acute findings °- Delirium precautions °- Psych medications per psychiatry's orders ° °

## 2022-02-20 NOTE — Assessment & Plan Note (Signed)
Chronic. 

## 2022-02-20 NOTE — Progress Notes (Signed)
Attempted to coordinate good behavior walk with security. Security unable to assist at this time.  ?

## 2022-02-20 NOTE — Progress Notes (Signed)
Refused last v/s ?

## 2022-02-20 NOTE — Assessment & Plan Note (Signed)
Apparently he was refusing prior bowel regimen. ?-Continue with bowel regimen-will do milk of magnesia as needed.  KUB yesterday was within normal limits ?Also has Imodium ordered.  It is a difficult balance between nursing able to manage his frequency of loose stools and him able to cooperate.  He very well could have hard stool and water leakage around it ?

## 2022-02-20 NOTE — Assessment & Plan Note (Signed)
EDP verified pt's DNR/DNI status with his mother ([place]) °

## 2022-02-20 NOTE — Progress Notes (Signed)
Patient pleasant and cooperative throughout majority of shift. Patient with hyperfixation on words/objects when having difficulty expressing himself. Patient tends to become irritable when having difficulty with word finding, but is able to be redirected and calmed with minimal difficulty.  ?Patient had one episode of attempting to swat at nurse. Patient behavior immediately corrected and patient reminded of need for respectful behaviors and actions. Patient verbalized understanding.  ?Patient took all po medications without difficulty and without evidence of cheeking.  ?Patient assisted to work on both verbal and writing skills during shift.  ? ?

## 2022-02-20 NOTE — Assessment & Plan Note (Signed)
elevated troponin (peaked to 23273), EKG with Q waves.Treated q 48 hrs heparin gtt ?- Continue aspirin, atorvastatin, metoprolol ?- Invasive evaluation deferred per cardiology.  ?- Monitoring for anginal signs or symptoms which are currently resolved ? ?

## 2022-02-20 NOTE — Progress Notes (Signed)
Patient took shower with min assist, needing help with tasks such as making sure all areas are clean and drying back. Patient cooperative and agreeable.  ?

## 2022-02-20 NOTE — Assessment & Plan Note (Signed)
Elevated CK most likely due to IM injections as per psych, less likely NMS.  ?- No changes in the current regimen. ? ?

## 2022-02-20 NOTE — Progress Notes (Signed)
Patient took AM medications without difficulty. Patient pleasant and cooperative.  ?

## 2022-02-21 DIAGNOSIS — J810 Acute pulmonary edema: Secondary | ICD-10-CM | POA: Diagnosis not present

## 2022-02-21 DIAGNOSIS — F209 Schizophrenia, unspecified: Secondary | ICD-10-CM | POA: Diagnosis not present

## 2022-02-21 LAB — CBC WITH DIFFERENTIAL/PLATELET
Abs Immature Granulocytes: 0.03 10*3/uL (ref 0.00–0.07)
Basophils Absolute: 0 10*3/uL (ref 0.0–0.1)
Basophils Relative: 1 %
Eosinophils Absolute: 0.1 10*3/uL (ref 0.0–0.5)
Eosinophils Relative: 2 %
HCT: 37.4 % — ABNORMAL LOW (ref 39.0–52.0)
Hemoglobin: 12.4 g/dL — ABNORMAL LOW (ref 13.0–17.0)
Immature Granulocytes: 1 %
Lymphocytes Relative: 28 %
Lymphs Abs: 1.1 10*3/uL (ref 0.7–4.0)
MCH: 30.7 pg (ref 26.0–34.0)
MCHC: 33.2 g/dL (ref 30.0–36.0)
MCV: 92.6 fL (ref 80.0–100.0)
Monocytes Absolute: 0.5 10*3/uL (ref 0.1–1.0)
Monocytes Relative: 12 %
Neutro Abs: 2.3 10*3/uL (ref 1.7–7.7)
Neutrophils Relative %: 56 %
Platelets: 188 10*3/uL (ref 150–400)
RBC: 4.04 MIL/uL — ABNORMAL LOW (ref 4.22–5.81)
RDW: 12.7 % (ref 11.5–15.5)
WBC: 4 10*3/uL (ref 4.0–10.5)
nRBC: 0 % (ref 0.0–0.2)

## 2022-02-21 NOTE — Progress Notes (Signed)
Pharmacy Consult - Clozapine   ?  ?61 yo male ordered clozapine 200 mg PO QHS  ?This patient's order has been reviewed for prescribing contraindications.  ?  ?Clozapine REMS enrollment Verified: yes on 01/04/22  ?REMS patient ID: [id number] ?Current Outpatient Monitoring: Monthly  ?  ?Home Regimen: ?Titration ?  ?Dose Adjustments This Admission: ?2/3 started on clozapine 25mg   ?2/10 clozapine 100 mg  ?2/20 clozapine 175 mg ?2/22 clozapine 200 mg  ?3/06 clozapine 250 mg ?3/08 clozapine 300 mg ?3/17 clozapine 350 mg >> ?  ?Labs: ?Date: 01/04/22 ANC: 2800   Submitted: 01/04/22 ?Date: 01/14/22 ANC: 2700   Submitted: 01/14/22 ?Date: 01/22/22 ANC: 4000   Submitted: 01/22/22 ?Date: 01/29/22 ANC: 2500   Submitted: 01/29/22 ?Date: 02/05/22 ANC: 2300   Submitted: 02/05/22 ?Date: 02/13/22 ANC: 2800   Submitted: 02/07/22 ?Date: 02/21/22 ANC: 2300   Submitted: 02/21/22 ?NEXT DUE - 03/01/31 ?  ?Plan: ?Continue clozapine with weekly monitoring ?Continue clozapine 350mg  QHS ?Monitor ANC at least weekly while inpatient  ? ?03/03/31, PharmD, BCCP ?Clinical Pharmacist ?02/21/2022 3:06 PM ? ? ? ? ?

## 2022-02-21 NOTE — Assessment & Plan Note (Signed)
Chronic. 

## 2022-02-21 NOTE — Assessment & Plan Note (Addendum)
Tmax 102.8 on 3/19.  Chest x-ray showing right lower lobe pneumonia suggestive of aspiration pneumonia as he is on high-risk for recurrent aspiration.  ?Completed course of augmentin as of 3/25 ?

## 2022-02-21 NOTE — Assessment & Plan Note (Addendum)
-  Continue with bowel regimen-will do milk of magnesia as needed. ?-Now moving bowels ?

## 2022-02-21 NOTE — Assessment & Plan Note (Signed)
elevated troponin (peaked to 23273), EKG with Q waves.Treated q 48 hrs heparin gtt ?- Continue aspirin, atorvastatin, metoprolol ?- Invasive evaluation deferred per cardiology.  ?- Monitoring for anginal signs or symptoms which are currently resolved ? ?

## 2022-02-21 NOTE — Assessment & Plan Note (Signed)
Chronic.  Becoming very agitated and aggressive at times.  Ativan recently increased to 2 mg every 4 hours per psychiatry.  He seem to have responded ?- Pursuing placement which is complicated. TOC and leadership involved in challenging disposition ?

## 2022-02-21 NOTE — Progress Notes (Signed)
?Progress Note ? ? ?Patient: Adam [name] JZP:[id number] DOB: [birth year] DOA: 07/20/2021     93 ?DOS: the patient was seen and examined on 02/21/2022 ?  ?Brief hospital course: ?61 yo WM with hx of schizophrenia and TBI, who was initially admitted to [facility] ER on July 20, 2021. He spent more than 123 days in the [facility] ED awaiting psychiatric placement.  He was initially brought to [facility] ER due to violent outbursts at his group home.  He was IVC'd initially.  He was noted to be lethargic 12/19, found to have NSTEMI.  Pt was admitted to hospitalist service.  Cardiology consulted who recommended no intervention.   ?After admission to the medical service patient became severely agitated on the floor.  Pt required to ICU night of 12/24 for precedex gtt due to severe agitation and combative behavior.  PCCM was consulted.  Precedex felt to be ineffective and was turned off.  Pt was transferred back to floor on 1/4.  Hospital course remarkable for agitation, violent outbursts ?Psychiatry was consulted,making some medication changes.  They do not accept pt to inpatient psych here and recommend continued search for longer term placement.  Senior management involved.  Plan for placement on a group home. ? ?3/15-3/18-difficult placement ?3/19: Tmax 102.8 earlier this morning.  Chest x-ray showing right lower lobe pneumonia.  Started oral Augmentin ?3/20-yelling and agitated this morning ?3/21: Loose bowel movements ? ?Assessment and Plan: ?* Schizophrenia, chronic condition with acute exacerbation (HCC) ?Psychiatry is following him here due to ongoing safety issues related to patient's behavioral disturbances.  ?- Continue medication as per psychiatric recommendation.  Ativan continued at 2 mg every 4 hours due to his agitation spells ?- No inpatient psychiatric admission recommended ? ?Aspiration pneumonia (HCC) ?Tmax 102.8 on 3/19.  Chest x-ray showing right lower lobe pneumonia suggestive of aspiration pneumonia as he is on  high-risk for recurrent aspiration.  Continue oral Augmentin for total 5 days.  Started on 3/19 ? ?Constipation ?Apparently he was refusing prior bowel regimen. ?-Continue with bowel regimen-will do milk of magnesia as needed.  Recent KUB  was within normal limits ?Also has Imodium ordered.  It is a difficult balance between nursing able to manage his frequency of loose stools and him able to cooperate.  He very well could have hard stool and water leakage around it ? ?Elevated CK ?Elevated CK most likely due to IM injections as per psych, less likely NMS.  ?- No changes in the current regimen. ? ? ?Stroke [facility]) ?Hx of stroke.  Seems to have significant aphasia, otherwise no apparent deficits. ?- Continue ASA, statin ? ?TBI (traumatic brain injury) ?Chronic ? ?Acute metabolic encephalopathy ?Currently at baseline. Head  CT f/u doesnot show any acute findings ?- Delirium precautions ?- Psych medications per psychiatry's orders. ? ? ?NSTEMI (non-ST elevated myocardial infarction) (HCC) ?elevated troponin (peaked to 23273), EKG with Q waves.Treated q 48 hrs heparin gtt ?- Continue aspirin, atorvastatin, metoprolol ?- Invasive evaluation deferred per cardiology.  ?- Monitoring for anginal signs or symptoms which are currently resolved ? ? ?DNR (do not resuscitate)/DNI(Do Not Intubate) ?EDP verified pt's DNR/DNI status with his mother ([place]). ? 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  Becoming very agitated and aggressive at times.  Ativan recently increased to 2 mg every 4 hours per psychiatry.  He seem to have responded ?- Pursuing placement which is complicated. TOC and leadership involved in challenging disposition ? ?Acute pulmonary edema (HCC)-resolved as of 01/31/2022 ?CXR  with mild cardiomegaly with diffuse interstitial opacities suggesting pulm edema - atypical/viral infection not excluded. Negative influenza/covid. This was likely due to NSTEMI. ?-  Resolved. ? ?Myocardial infarction (HCC)-resolved as of 02/20/2022 ?NSTEMI with elevated troponin (peaked to 23273), EKG with Q waves.Treated q 48 hrs heparin gtt ?- Continue aspirin, atorvastatin, metoprolol ?- Invasive evaluation deferred per cardiology.  ?- Monitoring for anginal signs or symptoms which are currently resolved ? ? ? ? ?  ? ?Subjective: Without complaints. Became agitated when arousing from sleep ? ?Physical Exam: ?Vitals:  ? 02/20/22 1030 02/20/22 1440 02/20/22 2033 02/21/22 0837  ?BP: 96/62 110/62 120/68 104/86  ?Pulse: 100 90 92 98  ?Resp: 18 18 18 18   ?Temp: 98 ?F (36.7 ?C) 99 ?F (37.2 ?C) 98.8 ?F (37.1 ?C) (!) 97.4 ?F (36.3 ?C)  ?TempSrc: Oral Oral  Oral  ?SpO2: 93% 94% 98% 97%  ?Weight:      ?Height:      ? ?General exam: Asleep, arousable, laying in bed, in nad ?Respiratory system: Normal respiratory effort, no wheezing ?Cardiovascular system: regular rate, s1, s2 ?Gastrointestinal system: Soft, nondistended, positive BS ?Central nervous system: CN2-12 grossly intact, strength intact ?Extremities: Perfused, no clubbing ?Skin: Normal skin turgor, no notable skin lesions seen ?Psychiatry: Difficult to assess given mentation ? ?Data Reviewed: ? ?There are no new results to review at this time. ? ?Family Communication: Pt in room, family not at bedside ? ?Disposition: ?Status is: Inpatient ? ?Remains inpatient appropriate because: Severity of illness and disposition ? ? ? Planned Discharge Destination:  Unclear at this time ? ? ? ?Author: ?Marylu Lund, MD ?02/21/2022 12:21 PM ? ?For on call review [email or web address].  ?

## 2022-02-21 NOTE — Progress Notes (Addendum)
Mobility Specialist - Progress Note ? ? 02/21/22 1500  ?Mobility  ?Activity Ambulated independently in hallway;Stood at bedside;Dangled on edge of bed  ?Range of Motion/Exercises Active  ?Level of Assistance Standby assist, set-up cues, supervision of patient - no hands on  ?Assistive Device None  ?Distance Ambulated (ft) 75 ft  ?Activity Response Tolerated well  ?$Mobility charge 1 Mobility  ? ? ? ?Pt recognized mobility specialist in hallway and walked up to her trying to voice his needs. Pt pointing at groin area but non-verbal. Pt reminded to use his words for clearer understanding and was able to voice his needs for new underwear which was provided to him. Pt ambulated back to room and sat EOB to don briefs. VC for sequencing of task as pt attempted to don briefs over pants. Heavy breathing noted. Pt expressed that needs were now met and was left in room with sitter present.  ? ? ?Kathee Delton ?Mobility Specialist ?02/21/22, 3:58 PM ? ? ? ? ?

## 2022-02-21 NOTE — Assessment & Plan Note (Addendum)
Psychiatry is following him here due to ongoing safety issues related to patient's behavioral disturbances.  ?- Continue medication as per psychiatric recommendation.  Ativan continued at 2 mg every 4 hours due to his agitation spells ?- No inpatient psychiatric admission recommended ?- Pt asleep but arousable, reportedly did not go to sleep until 2am overnight ?

## 2022-02-21 NOTE — Progress Notes (Signed)
Patient refused gabapentin 300mg  after 2 attempts.  ?

## 2022-02-21 NOTE — TOC Progression Note (Signed)
Transition of Care (TOC) - Progression Note  ? ? ?Patient Details  ?Name: Adam [name] ?MRN: [id number] ?Date of Birth: [birth year] ? ?Transition of Care (TOC) CM/SW Contact  ?Jess Toney A Kyriana Yankee, LCSW ?Phone Number: ?02/21/2022, 1:54 PM ? ?Clinical Narrative:   No changes with placement. Pt still remains on list for [facility], [place], and [place]. ? ? ? ?Expected Discharge Plan: Group Home ?Barriers to Discharge: Continued Medical Work up ? ?Expected Discharge Plan and Services ?Expected Discharge Plan: Group Home ?In-house Referral: Clinical Social Work ?  ?  ?Living arrangements for the past 2 months: Group Home ?                ?  ?  ?  ?  ?  ?  ?  ?  ?  ?  ? ? ?Social Determinants of Health (SDOH) Interventions ?  ? ?Readmission Risk Interventions ?   ? View : No data to display.  ?  ?  ?  ? ? ?

## 2022-02-22 DIAGNOSIS — F09 Unspecified mental disorder due to known physiological condition: Secondary | ICD-10-CM | POA: Diagnosis not present

## 2022-02-22 DIAGNOSIS — F209 Schizophrenia, unspecified: Secondary | ICD-10-CM | POA: Diagnosis not present

## 2022-02-22 DIAGNOSIS — G3189 Other specified degenerative diseases of nervous system: Secondary | ICD-10-CM | POA: Diagnosis not present

## 2022-02-22 DIAGNOSIS — G9341 Metabolic encephalopathy: Secondary | ICD-10-CM | POA: Diagnosis not present

## 2022-02-22 DIAGNOSIS — R4689 Other symptoms and signs involving appearance and behavior: Secondary | ICD-10-CM | POA: Diagnosis not present

## 2022-02-22 NOTE — Progress Notes (Signed)
?   02/22/22 1500  ?Clinical Encounter Type  ?Visited With Patient;Health care provider  ?Visit Type Follow-up;Social support  ? ?Chaplain attempted to engage with patient however, he was no wanting to converse today. ?

## 2022-02-22 NOTE — Progress Notes (Signed)
?Progress Note ? ? ?Patient: Adam [name] DIY:[id number] DOB: 09-Sep-1961 DOA: 07/20/2021     94 ?DOS: the patient was seen and examined on 02/22/2022 ?  ?Brief hospital course: ?61 yo WM with hx of schizophrenia and TBI, who was initially admitted to [facility] ER on July 20, 2021. He spent more than 123 days in the [facility] ED awaiting psychiatric placement.  He was initially brought to [facility] ER due to violent outbursts at his group home.  He was IVC'd initially.  He was noted to be lethargic 12/19, found to have NSTEMI.  Pt was admitted to hospitalist service.  Cardiology consulted who recommended no intervention.   ?After admission to the medical service patient became severely agitated on the floor.  Pt required to ICU night of 12/24 for precedex gtt due to severe agitation and combative behavior.  PCCM was consulted.  Precedex felt to be ineffective and was turned off.  Pt was transferred back to floor on 1/4.  Hospital course remarkable for agitation, violent outbursts ?Psychiatry was consulted,making some medication changes.  They do not accept pt to inpatient psych here and recommend continued search for longer term placement.  Senior management involved.  Plan for placement on a group home. ? ?3/15-3/18-difficult placement ?3/19: Tmax 102.8 earlier this morning.  Chest x-ray showing right lower lobe pneumonia.  Started oral Augmentin ?3/20-yelling and agitated this morning ?3/21: Loose bowel movements ? ?Assessment and Plan: ?* Schizophrenia, chronic condition with acute exacerbation (HCC) ?Psychiatry is following him here due to ongoing safety issues related to patient's behavioral disturbances.  ?- Continue medication as per psychiatric recommendation.  Ativan continued at 2 mg every 4 hours due to his agitation spells ?- No inpatient psychiatric admission recommended ?- Today awake, watching video on phone, yelling out but very pleasant, smiling and fist-bumping staff ? ?Aspiration pneumonia (HCC) ?Tmax 102.8 on  3/19.  Chest x-ray showing right lower lobe pneumonia suggestive of aspiration pneumonia as he is on high-risk for recurrent aspiration.  Continue oral Augmentin for total 5 days to complete on 3/25 ? ?Constipation ?Apparently he was refusing prior bowel regimen. ?-Continue with bowel regimen-will do milk of magnesia as needed.  Recent KUB  was within normal limits ?Also has Imodium ordered.  It is a difficult balance between nursing able to manage his frequency of loose stools and him able to cooperate.  He very well could have hard stool and water leakage around it ? ?Elevated CK ?Elevated CK most likely due to IM injections as per psych, less likely NMS.  ?- No changes in the current regimen. ? ? ?Stroke [facility]) ?Hx of stroke.  Seems to have significant aphasia, otherwise no apparent deficits. ?- Continue ASA, statin ? ?TBI (traumatic brain injury) ?Chronic ? ?Acute metabolic encephalopathy ?Currently at baseline. Head  CT f/u doesnot show any acute findings ?- Delirium precautions ?- Psych medications per psychiatry's orders. ? ? ?NSTEMI (non-ST elevated myocardial infarction) (HCC) ?elevated troponin (peaked to 23273), EKG with Q waves.Treated q 48 hrs heparin gtt ?- Continue aspirin, atorvastatin, metoprolol ?- Invasive evaluation deferred per cardiology.  ?- Monitoring for anginal signs or symptoms which are currently resolved ? ? ?DNR (do not resuscitate)/DNI(Do Not Intubate) ?EDP verified pt's DNR/DNI status with his mother (Adam [name]). ? 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  Becoming very agitated and aggressive at times.  Ativan recently increased to 2 mg every 4 hours per psychiatry.  He seem to have responded ?- Pursuing placement which is complicated.  TOC and leadership involved in challenging disposition ? ?Acute pulmonary edema (HCC)-resolved as of 01/31/2022 ?CXR with mild cardiomegaly with diffuse interstitial opacities suggesting pulm edema -  atypical/viral infection not excluded. Negative influenza/covid. This was likely due to NSTEMI. ?- Resolved. ? ?Myocardial infarction (HCC)-resolved as of 02/20/2022 ?NSTEMI with elevated troponin (peaked to 23273), EKG with Q waves.Treated q 48 hrs heparin gtt ?- Continue aspirin, atorvastatin, metoprolol ?- Invasive evaluation deferred per cardiology.  ?- Monitoring for anginal signs or symptoms which are currently resolved ? ? ? ? ?  ? ?Subjective: Awake, not very communicative, instead yells out in a pleasant manner while watching video on phone ? ?Physical Exam: ?Vitals:  ? 02/20/22 1440 02/20/22 2033 02/21/22 0837 02/22/22 0955  ?BP: 110/62 120/68 104/86 119/82  ?Pulse: 90 92 98 (!) 110  ?Resp: 18 18 18 20   ?Temp: 99 ?F (37.2 ?C) 98.8 ?F (37.1 ?C) (!) 97.4 ?F (36.3 ?C) (!) 97.5 ?F (36.4 ?C)  ?TempSrc: Oral  Oral Oral  ?SpO2: 94% 98% 97% 100%  ?Weight:      ?Height:      ? ?General exam: Not conversant, in no acute distress ?Respiratory system: normal chest rise, clear, no audible wheezing ?Cardiovascular system: regular rhythm, s1-s2 ?Gastrointestinal system: Nondistended, nontender, pos BS ?Central nervous system: No seizures, no tremors ?Extremities: No cyanosis, no joint deformities ?Skin: No rashes, no pallor ?Psychiatry: Difficult to assess given mentation ? ?Data Reviewed: ? ?There are no new results to review at this time. ? ?Family Communication: Pt in room, family not at bedside ? ?Disposition: ?Status is: Inpatient ? ?Remains inpatient appropriate because: Severity of illness and disposition ? ? ? Planned Discharge Destination:  Unclear at this time ? ? ? ?Author: ?Marylu Lund, MD ?02/22/2022 1:56 PM ? ?For on call review [email or web address].  ?

## 2022-02-23 ENCOUNTER — Inpatient Hospital Stay: Payer: Medicare HMO

## 2022-02-23 DIAGNOSIS — G9341 Metabolic encephalopathy: Secondary | ICD-10-CM | POA: Diagnosis not present

## 2022-02-23 DIAGNOSIS — R4689 Other symptoms and signs involving appearance and behavior: Secondary | ICD-10-CM

## 2022-02-23 DIAGNOSIS — F209 Schizophrenia, unspecified: Secondary | ICD-10-CM | POA: Diagnosis not present

## 2022-02-23 DIAGNOSIS — F2089 Other schizophrenia: Secondary | ICD-10-CM

## 2022-02-23 DIAGNOSIS — F0670 Mild neurocognitive disorder due to known physiological condition without behavioral disturbance: Secondary | ICD-10-CM

## 2022-02-23 MED ORDER — GABAPENTIN 600 MG PO TABS
300.0000 mg | ORAL_TABLET | Freq: Three times a day (TID) | ORAL | Status: DC
  Administered 2022-02-23 – 2022-03-19 (×64): 300 mg via ORAL
  Filled 2022-02-23 (×68): qty 1

## 2022-02-23 NOTE — Progress Notes (Signed)
?Progress Note ? ? ?Patient: Adam [name] [id number] DOB: [birth year] DOA: 07/20/2021     95 ?DOS: the patient was seen and examined on 02/23/2022 ?  ?Brief hospital course: ?61 yo WM with hx of schizophrenia and TBI, who was initially admitted to [facility] ER on July 20, 2021. He spent more than 123 days in the [facility] ED awaiting psychiatric placement.  He was initially brought to [facility] ER due to violent outbursts at his group home.  He was IVC'd initially.  He was noted to be lethargic 12/19, found to have NSTEMI.  Pt was admitted to hospitalist service.  Cardiology consulted who recommended no intervention.   ?After admission to the medical service patient became severely agitated on the floor.  Pt required to ICU night of 12/24 for precedex gtt due to severe agitation and combative behavior.  PCCM was consulted.  Precedex felt to be ineffective and was turned off.  Pt was transferred back to floor on 1/4.  Hospital course remarkable for agitation, violent outbursts ?Psychiatry was consulted,making some medication changes.  They do not accept pt to inpatient psych here and recommend continued search for longer term placement.  Senior management involved.  Plan for placement on a group home. ? ?3/15-3/18-difficult placement ?3/19: Tmax 102.8 earlier this morning.  Chest x-ray showing right lower lobe pneumonia.  Started oral Augmentin ?3/20-yelling and agitated this morning ?3/21: Loose bowel movements ? ?Assessment and Plan: ?* Schizophrenia, chronic condition with acute exacerbation ([place]) ?Psychiatry is following him here due to ongoing safety issues related to patient's behavioral disturbances.  ?- Continue medication as per psychiatric recommendation.  Ativan continued at 2 mg every 4 hours due to his agitation spells ?- No inpatient psychiatric admission recommended ?- Asleep this AM. Staff reports pt did require haldol overnight for agitation ? ?Aspiration pneumonia ([place]) ?Tmax 102.8 on 3/19.  Chest x-ray showing  right lower lobe pneumonia suggestive of aspiration pneumonia as he is on high-risk for recurrent aspiration.  Continue oral Augmentin for total 5 days to complete tomorrow on 3/25 ? ?Constipation ?Apparently he was refusing prior bowel regimen. ?-Continue with bowel regimen-will do milk of magnesia as needed.  Recent KUB  was within normal limits ?Also has Imodium ordered.  It is a difficult balance between nursing able to manage his frequency of loose stools and him able to cooperate.  He very well could have hard stool and water leakage around it ? ?Elevated CK ?Elevated CK most likely due to IM injections as per psych, less likely NMS.  ?- No changes in the current regimen. ? ? ?Stroke [facility]) ?Hx of stroke.  Seems to have significant aphasia, otherwise no apparent deficits. ?- Continue ASA, statin ? ?TBI (traumatic brain injury) ?Chronic ? ?Acute metabolic encephalopathy ?Currently at baseline. Head  CT f/u doesnot show any acute findings ?- Delirium precautions ?- Psych medications per psychiatry's orders. ? ? ?NSTEMI (non-ST elevated myocardial infarction) ([place]) ?elevated troponin (peaked to 23273), EKG with Q waves.Treated q 48 hrs heparin gtt ?- Continue aspirin, atorvastatin, metoprolol ?- Invasive evaluation deferred per cardiology.  ?- Monitoring for anginal signs or symptoms which are currently resolved ? ? ?DNR (do not resuscitate)/DNI(Do Not Intubate) ?EDP verified pt's DNR/DNI status with his mother ([place]). ? 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  Becoming very agitated and aggressive at times.  Ativan recently increased to 2 mg every 4 hours per psychiatry.  He seem to have responded ?- Pursuing placement which is complicated. TOC and  leadership involved in challenging disposition ? ?Acute pulmonary edema (HCC)-resolved as of 01/31/2022 ?CXR with mild cardiomegaly with diffuse interstitial opacities suggesting pulm edema - atypical/viral infection  not excluded. Negative influenza/covid. This was likely due to NSTEMI. ?- Resolved. ? ?Myocardial infarction (HCC)-resolved as of 02/20/2022 ?NSTEMI with elevated troponin (peaked to 23273), EKG with Q waves.Treated q 48 hrs heparin gtt ?- Continue aspirin, atorvastatin, metoprolol ?- Invasive evaluation deferred per cardiology.  ?- Monitoring for anginal signs or symptoms which are currently resolved ? ? ? ? ?  ? ?Subjective: Asleep this AM. Had received dose of haldol overnight ? ?Physical Exam: ?Vitals:  ? 02/22/22 1529 02/22/22 2252 02/23/22 1207 02/23/22 1238  ?BP: 122/78 (!) 122/91  120/88  ?Pulse: 88 95  (!) 108  ?Resp: 17 16 16    ?Temp: 98.1 ?F (36.7 ?C) 98.8 ?F (37.1 ?C)  (!) 97.4 ?F (36.3 ?C)  ?TempSrc: Oral Oral  Oral  ?SpO2: 100% 98%  97%  ?Weight:      ?Height:      ? ?General exam: Asleep, laying in bed, in nad ?Respiratory system: Normal respiratory effort, no wheezing ?Cardiovascular system: regular rate, s1, s2 ?Gastrointestinal system: Soft, nondistended, positive BS ?Central nervous system: CN2-12 grossly intact, strength intact ?Extremities: Perfused, no clubbing ?Skin: Normal skin turgor, no notable skin lesions seen ?Psychiatry: Unable to assess given mentation ? ?Data Reviewed: ? ?There are no new results to review at this time. ? ?Family Communication: Pt in room, family not at bedside ? ?Disposition: ?Status is: Inpatient ? ?Remains inpatient appropriate because: Severity of illness and disposition ? ? ? Planned Discharge Destination:  Unclear at this time ? ? ? ?Author: ?Marylu Lund, MD ?02/23/2022 1:54 PM ? ?For on call review [email or web address].  ?

## 2022-02-23 NOTE — Progress Notes (Signed)
Was able to reach patients mom Adam [name] at [phone]  about patient falling to left knee while ambulating with nurse tech outside room.  Told her MD was aware.  Also told her patient had no complaints of pain and no swelling was noted.  She appreciated the call.   ?

## 2022-02-23 NOTE — Progress Notes (Signed)
?   02/23/22 1207  ?What Happened  ?Was fall witnessed? Yes  ?Who witnessed fall? Cheyenne Gut NT  ?Patients activity before fall other (comment) ?(walking assisted in hall by nurse tech but has only ted hose on as he refused non slip socks and shoes)  ?Point of contact other (comment) ?(left knee)  ?Follow Up  ?MD notified Dr Georgeann Oppenheim  ?Time MD notified 1212  ?Additional tests No  ?Simple treatment Other (comment) ?(none-  knee only slighty reddened no complaits of pain from patient)  ?Adult Fall Risk Assessment  ?Risk Factor Category (scoring not indicated) High fall risk per protocol (document High fall risk)  ?Age 61  ?Fall History: Fall within 6 months prior to admission 5  ?Elimination; Bowel and/or Urine Incontinence 0  ?Elimination; Bowel and/or Urine Urgency/Frequency 0  ?Medications: includes PCA/Opiates, Anti-convulsants, Anti-hypertensives, Diuretics, Hypnotics, Laxatives, Sedatives, and Psychotropics 5  ?Patient Care Equipment 0  ?Mobility-Assistance 2  ?Mobility-Gait 0  ?Mobility-Sensory Deficit 0  ?Altered awareness of immediate physical environment 0  ?Impulsiveness 2  ?Lack of understanding of one's physical/cognitive limitations 4  ?Total Score 19  ?Patient Fall Risk Level High fall risk  ?Adult Fall Risk Interventions  ?Required Bundle Interventions *See Row Information* High fall risk - low, moderate, and high requirements implemented  ?Additional Interventions Safety Sitter/Safety Rounder;Use of appropriate toileting equipment (bedpan, BSC, etc.);Room near nurses station  ?Screening for Fall Injury Risk (To be completed on HIGH fall risk patients) - Assessing Need for Floor Mats  ?Risk For Fall Injury- Criteria for Floor Mats Previous fall this admission  ?Will Implement Floor Mats Yes  ?Vitals  ?Temp  ?(patient refsued vitals at the time of fall see vitals at 12;38)  ?Resp 16  ?Oxygen Therapy  ?SpO2  ?(refused)  ?Pain Assessment  ?Pain Scale 0-10  ?Pain Score 0  ?PAINAD (Pain Assessment in  Advanced Dementia)  ?Breathing 0  ?Negative Vocalization 0  ?Facial Expression 0  ?Body Language 0  ?Consolability 0  ?PAINAD Score 0  ?Neurological  ?Neuro (WDL) X  ?Level of Consciousness Alert  ?Orientation Level Oriented to person;Disoriented to place;Disoriented to time;Disoriented to situation  ?Cognition Follows commands;Poor judgement;Poor safety awareness  ?Speech Clear  ?R Pupil Size (mm) Other (Comment) ?(patient refused assessement)  ?R Hand Grip Strong  ?L Hand Grip Strong  ?R Foot Dorsiflexion Other (Comment) ?(refused)  ?L Foot Dorsiflexion Other (Comment) ?(refused)  ?R Foot Plantar Flexion Other (Comment) ?(refused)  ?L Foot Plantar Flexion Other (Comment) ?(refused)  ?RUE Motor Response Purposeful movement  ?LUE Motor Response Purposeful movement  ?RLE Motor Response Purposeful movement  ?LLE Motor Response Purposeful movement  ?Neuro Symptoms Agitation  ?Neuro symptoms relieved by Anti-anxiety medication  ?Glasgow Coma Scale  ?Eye Opening 4  ?Best Verbal Response (NON-intubated) 4  ?Best Motor Response 6  ?Glasgow Coma Scale Score 14  ?Musculoskeletal  ?Musculoskeletal (WDL) WDL  ?Assistive Device None  ?Weight Bearing Restrictions No  ?Integumentary  ?Integumentary (WDL) X  ?Skin Condition Other (Comment) ?(left knee slighty red no swelling)  ?Skin Integrity Ecchymosis  ?Ecchymosis Location  ?(scattered)  ?Erythema/Redness Location Knee  ?Erythema/Redness Location Orientation Left  ?Pain Assessment  ?Date Pain First Started 11/08/22  ?Result of Injury Yes ?(but no c/o pain)  ?Pain Assessment  ?Work-Related Injury No  ? ?

## 2022-02-23 NOTE — TOC Progression Note (Signed)
Transition of Care (TOC) - Progression Note  ? ? ?Patient Details  ?Name: Adam [name] ?MRN: [id number] ?Date of Birth: 1961/10/27 ? ?Transition of Care (TOC) CM/SW Contact  ?Leighana Neyman A Tateanna Bach, LCSW ?Phone Number: ?02/23/2022, 1:25 PM ? ?Clinical Narrative:   No changes with placement. Pt still remains on list for [facility], [facility], and [facility]. ? ? ? ?Expected Discharge Plan: Group Home ?Barriers to Discharge: Continued Medical Work up ? ?Expected Discharge Plan and Services ?Expected Discharge Plan: Group Home ?In-house Referral: Clinical Social Work ?  ?  ?Living arrangements for the past 2 months: Group Home ?                ?  ?  ?  ?  ?  ?  ?  ?  ?  ?  ? ? ?Social Determinants of Health (SDOH) Interventions ?  ? ?Readmission Risk Interventions ?   ? View : No data to display.  ?  ?  ?  ? ? ?

## 2022-02-23 NOTE — Progress Notes (Signed)
Dr Rickey Barbara aware of patient fall to knee in hallway, per Md left knee x ray ordered.  Knee only reddened no swelling or c/o pain noted.   ?

## 2022-02-24 DIAGNOSIS — G9341 Metabolic encephalopathy: Secondary | ICD-10-CM | POA: Diagnosis not present

## 2022-02-24 DIAGNOSIS — W19XXXA Unspecified fall, initial encounter: Secondary | ICD-10-CM

## 2022-02-24 DIAGNOSIS — R4689 Other symptoms and signs involving appearance and behavior: Secondary | ICD-10-CM | POA: Diagnosis not present

## 2022-02-24 DIAGNOSIS — F209 Schizophrenia, unspecified: Secondary | ICD-10-CM | POA: Diagnosis not present

## 2022-02-24 NOTE — Progress Notes (Signed)
?   02/24/22 1337  ?Provider Notification  ?Provider Name/Title Dr. Rhona Leavens  ?Date Provider Notified 02/24/22  ?Time Provider Notified [phone]  ?Notification Type Page  ?Notification Reason Red med refusal;Other (Comment) ?(Patient refusing to take all morning medications. Attempted x4, still refusing. Patient also very drowsy.)  ?Provider response Other (Comment) ?(MD aware)  ?Date of Provider Response 02/24/22  ?Time of Provider Response 1337  ? ? ?

## 2022-02-24 NOTE — Assessment & Plan Note (Addendum)
-  on 3/24, noted to have fall w/o LOC while ambulating with staff ?-Noted to have fallen on L knee. Pt reportedly had no subsequent knee pain and was able to ambulate without difficulty ?-On exam, no obvious sign of injury or bruising. ?-Xray was initially ordered, however as pt has no visible injury and is ambulating without any issues, that order was discontinued ?

## 2022-02-24 NOTE — Progress Notes (Signed)
?Progress Note ? ? ?Patient: Adam [name] DOB: 10-26-1961 DOA: 07/20/2021     96 ?DOS: the patient was seen and examined on 02/24/2022 ?  ?Brief hospital course: ?61 yo WM with hx of schizophrenia and TBI, who was initially admitted to [facility] ER on July 20, 2021. He spent more than 123 days in the [facility] ED awaiting psychiatric placement.  He was initially brought to [facility] ER due to violent outbursts at his group home.  He was IVC'd initially.  He was noted to be lethargic 12/19, found to have NSTEMI.  Pt was admitted to hospitalist service.  Cardiology consulted who recommended no intervention.   ?After admission to the medical service patient became severely agitated on the floor.  Pt required to ICU night of 12/24 for precedex gtt due to severe agitation and combative behavior.  PCCM was consulted.  Precedex felt to be ineffective and was turned off.  Pt was transferred back to floor on 1/4.  Hospital course remarkable for agitation, violent outbursts ?Psychiatry was consulted,making some medication changes.  They do not accept pt to inpatient psych here and recommend continued search for longer term placement.  Senior management involved.  Plan for placement on a group home. ? ?3/15-3/18-difficult placement ?3/19: Tmax 102.8 earlier this morning.  Chest x-ray showing right lower lobe pneumonia.  Started oral Augmentin ?3/20-yelling and agitated this morning ?3/21: Loose bowel movements ? ?Assessment and Plan: ?* Schizophrenia, chronic condition with acute exacerbation ([place]) ?Psychiatry is following him here due to ongoing safety issues related to patient's behavioral disturbances.  ?- Continue medication as per psychiatric recommendation.  Ativan continued at 2 mg every 4 hours due to his agitation spells ?- No inpatient psychiatric admission recommended ?- sleeping this AM, ? ?Fall ?-on 3/24, noted to have fall w/o LOC while ambulating with staff ?-Noted to have fallen on L knee. Pt reportedly had no  subsequent knee pain and was able to ambulate without difficulty ?-On exam, no obvious sign of injury or bruising. ?-Xray was initially ordered, however as pt has no visible injury and is ambulating, and was also not tolerating xray, that order was discontinued ? ?Aspiration pneumonia ([place]) ?Tmax 102.8 on 3/19.  Chest x-ray showing right lower lobe pneumonia suggestive of aspiration pneumonia as he is on high-risk for recurrent aspiration.  Continue oral Augmentin for total 5 days to complete tomorrow on 3/25 ? ?Constipation ?Apparently he was refusing prior bowel regimen. ?-Continue with bowel regimen-will do milk of magnesia as needed.  Recent KUB  was within normal limits ?Also has Imodium ordered.  It is a difficult balance between nursing able to manage his frequency of loose stools and him able to cooperate.  He very well could have hard stool and water leakage around it ? ?Elevated CK ?Elevated CK most likely due to IM injections as per psych, less likely NMS.  ?- No changes in the current regimen. ? ? ?Stroke [facility]) ?Hx of stroke.  Seems to have significant aphasia, otherwise no apparent deficits. ?- Continue ASA, statin ? ?TBI (traumatic brain injury) ?Chronic ? ?Acute metabolic encephalopathy ?Currently at baseline. Head  CT f/u doesnot show any acute findings ?- Delirium precautions ?- Psych medications per psychiatry's orders. ? ? ?NSTEMI (non-ST elevated myocardial infarction) ([place]) ?elevated troponin (peaked to 23273), EKG with Q waves.Treated q 48 hrs heparin gtt ?- Continue aspirin, atorvastatin, metoprolol ?- Invasive evaluation deferred per cardiology.  ?- Monitoring for anginal signs or symptoms which are currently resolved ? ? ?DNR (  do not resuscitate)/DNI(Do Not Intubate) ?EDP verified pt's DNR/DNI status with his mother ([place]). ? 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  Becoming very agitated and aggressive at times.  Ativan recently increased  to 2 mg every 4 hours per psychiatry.  He seem to have responded ?- Pursuing placement which is complicated. TOC and leadership involved in challenging disposition ? ?Acute pulmonary edema (HCC)-resolved as of 01/31/2022 ?CXR with mild cardiomegaly with diffuse interstitial opacities suggesting pulm edema - atypical/viral infection not excluded. Negative influenza/covid. This was likely due to NSTEMI. ?- Resolved. ? ?Myocardial infarction (HCC)-resolved as of 02/20/2022 ?NSTEMI with elevated troponin (peaked to 23273), EKG with Q waves.Treated q 48 hrs heparin gtt ?- Continue aspirin, atorvastatin, metoprolol ?- Invasive evaluation deferred per cardiology.  ?- Monitoring for anginal signs or symptoms which are currently resolved ? ? ? ?  ? ?Subjective: Asleep this AM, difficult to assess ? ?Physical Exam: ?Vitals:  ? 02/22/22 1529 02/22/22 2252 02/23/22 1207 02/23/22 1238  ?BP: 122/78 (!) 122/91  120/88  ?Pulse: 88 95  (!) 108  ?Resp: 17 16 16    ?Temp: 98.1 ?F (36.7 ?C) 98.8 ?F (37.1 ?C)  (!) 97.4 ?F (36.3 ?C)  ?TempSrc: Oral Oral  Oral  ?SpO2: 100% 98%  97%  ?Weight:      ?Height:      ? ?General exam: asleep, in no acute distress ?Respiratory system: normal chest rise, clear, no audible wheezing ?Cardiovascular system: regular rhythm, s1-s2 ?Gastrointestinal system: Nondistended, nontender, pos BS ?Central nervous system: No seizures, no tremors ?Extremities: No cyanosis, no joint deformities ?Skin: No rashes, no pallor ?Psychiatry: difficult to assess given mentation  ? ?Data Reviewed: ? ?There are no new results to review at this time. ? ?Family Communication: Pt in room, family not at bedside ? ?Disposition: ?Status is: Inpatient ? ?Remains inpatient appropriate because: Severity of illness and disposition ? ? ? Planned Discharge Destination:  Unclear at this time ? ? ? ?Author: ?Marylu Lund, MD ?02/24/2022 1:07 PM ? ?For on call review [email or web address]  ?

## 2022-02-25 DIAGNOSIS — F209 Schizophrenia, unspecified: Secondary | ICD-10-CM | POA: Diagnosis not present

## 2022-02-25 DIAGNOSIS — J810 Acute pulmonary edema: Secondary | ICD-10-CM | POA: Diagnosis not present

## 2022-02-25 NOTE — Progress Notes (Signed)
?Progress Note ? ? ?Patient: Adam [name] [id number] DOB: [birth year] DOA: 07/20/2021     97 ?DOS: the patient was seen and examined on 02/25/2022 ?  ?Brief hospital course: ?61 yo WM with hx of schizophrenia and TBI, who was initially admitted to [facility] ER on July 20, 2021. He spent more than 123 days in the [facility] ED awaiting psychiatric placement.  He was initially brought to [facility] ER due to violent outbursts at his group home.  He was IVC'd initially.  He was noted to be lethargic 12/19, found to have NSTEMI.  Pt was admitted to hospitalist service.  Cardiology consulted who recommended no intervention.   ?After admission to the medical service patient became severely agitated on the floor.  Pt required to ICU night of 12/24 for precedex gtt due to severe agitation and combative behavior.  PCCM was consulted.  Precedex felt to be ineffective and was turned off.  Pt was transferred back to floor on 1/4.  Hospital course remarkable for agitation, violent outbursts ?Psychiatry was consulted,making some medication changes.  They do not accept pt to inpatient psych here and recommend continued search for longer term placement.  Senior management involved.  Plan for placement on a group home. ? ?3/15-3/18-difficult placement ?3/19: Tmax 102.8 earlier this morning.  Chest x-ray showing right lower lobe pneumonia.  Started oral Augmentin ?3/20-yelling and agitated this morning ?3/21: Loose bowel movements ? ?Assessment and Plan: ?* Schizophrenia, chronic condition with acute exacerbation ([place]) ?Psychiatry is following him here due to ongoing safety issues related to patient's behavioral disturbances.  ?- Continue medication as per psychiatric recommendation.  Ativan continued at 2 mg every 4 hours due to his agitation spells ?- No inpatient psychiatric admission recommended ?- wake, ambulating back from bathroom to sit and eat breakfast ? ?Fall ?-on 3/24, noted to have fall w/o LOC while ambulating with staff ?-Noted to  have fallen on L knee. Pt reportedly had no subsequent knee pain and was able to ambulate without difficulty ?-On exam, no obvious sign of injury or bruising. ?-Xray was initially ordered, however as pt has no visible injury and is ambulating, and was also not tolerating xray, that order was discontinued ? ?Aspiration pneumonia ([place]) ?Tmax 102.8 on 3/19.  Chest x-ray showing right lower lobe pneumonia suggestive of aspiration pneumonia as he is on high-risk for recurrent aspiration.  ?Completed course of augmentin as of 3/25 ? ?Constipation ?Apparently he was refusing prior bowel regimen. ?-Continue with bowel regimen-will do milk of magnesia as needed.  Recent KUB  was within normal limits ?Also has Imodium ordered.  ? ?Elevated CK ?Elevated CK most likely due to IM injections as per psych, less likely NMS.  ?- No changes in the current regimen. ? ? ?Stroke [facility]) ?Hx of stroke.  Seems to have significant aphasia, otherwise no apparent deficits. ?- Continue ASA, statin ? ?TBI (traumatic brain injury) ?Chronic ? ?Acute metabolic encephalopathy ?Currently at baseline. Head  CT f/u doesnot show any acute findings ?- Delirium precautions ?- Psych medications per psychiatry's orders. ? ? ?NSTEMI (non-ST elevated myocardial infarction) ([place]) ?elevated troponin (peaked to 23273), EKG with Q waves.Treated q 48 hrs heparin gtt ?- Continue aspirin, atorvastatin, metoprolol ?- Invasive evaluation deferred per cardiology.  ?- Monitoring for anginal signs or symptoms which are currently resolved ? ? ?DNR (do not resuscitate)/DNI(Do Not Intubate) ?EDP verified pt's DNR/DNI status with his mother (Adam [name]). ? 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  Becoming very agitated and aggressive  at times.  Ativan recently increased to 2 mg every 4 hours per psychiatry.  He seem to have responded ?- Pursuing placement which is complicated. TOC and leadership involved in challenging  disposition ? ?Acute pulmonary edema (HCC)-resolved as of 01/31/2022 ?CXR with mild cardiomegaly with diffuse interstitial opacities suggesting pulm edema - atypical/viral infection not excluded. Negative influenza/covid. This was likely due to NSTEMI. ?- Resolved. ? ?Myocardial infarction (HCC)-resolved as of 02/20/2022 ?NSTEMI with elevated troponin (peaked to 23273), EKG with Q waves.Treated q 48 hrs heparin gtt ?- Continue aspirin, atorvastatin, metoprolol ?- Invasive evaluation deferred per cardiology.  ?- Monitoring for anginal signs or symptoms which are currently resolved ? ? ?  ? ?Subjective: Yells, "No!" When asked if he needed help with breakfast ? ?Physical Exam: ?Vitals:  ? 02/23/22 1207 02/23/22 1238 02/25/22 0039 02/25/22 1222  ?BP:  120/88 (!) 132/98 109/86  ?Pulse:  (!) 108 (!) 110 (!) 110  ?Resp: 16  16 18   ?Temp:  (!) 97.4 ?F (36.3 ?C) 98.1 ?F (36.7 ?C) (!) 97.5 ?F (36.4 ?C)  ?TempSrc:  Oral Oral Oral  ?SpO2:  97% 99% 98%  ?Weight:      ?Height:      ? ?General exam: Awake, laying in bed, in nad ?Respiratory system: Normal respiratory effort, no wheezing ?Cardiovascular system: regular rate, s1, s2 ?Gastrointestinal system: Soft, nondistended, positive BS ?Central nervous system: CN2-12 grossly intact, strength intact ?Extremities: Perfused, no clubbing ?Skin: Normal skin turgor, no notable skin lesions seen ?Psychiatry: difficult to assess given mentation ? ?Data Reviewed: ? ?There are no new results to review at this time. ? ?Family Communication: Pt in room, family not at bedside ? ?Disposition: ?Status is: Inpatient ? ?Remains inpatient appropriate because: Severity of illness and disposition ? ? ? Planned Discharge Destination:  Unclear at this time ? ? ? ?Author: ?Marylu Lund, MD ?02/25/2022 1:14 PM ? ?For on call review [email or web address].  ?

## 2022-02-26 DIAGNOSIS — F209 Schizophrenia, unspecified: Secondary | ICD-10-CM | POA: Diagnosis not present

## 2022-02-26 DIAGNOSIS — J810 Acute pulmonary edema: Secondary | ICD-10-CM | POA: Diagnosis not present

## 2022-02-26 NOTE — Progress Notes (Signed)
?Progress Note ? ? ?Patient: Adam [name] [id number] DOB: [birth year] DOA: 07/20/2021     98 ?DOS: the patient was seen and examined on 02/26/2022 ?  ?Brief hospital course: ?61 yo WM with hx of schizophrenia and TBI, who was initially admitted to [facility] ER on July 20, 2021. He spent more than 123 days in the [facility] ED awaiting psychiatric placement.  He was initially brought to [facility] ER due to violent outbursts at his group home.  He was IVC'd initially.  He was noted to be lethargic 12/19, found to have NSTEMI.  Pt was admitted to hospitalist service.  Cardiology consulted who recommended no intervention.   ?After admission to the medical service patient became severely agitated on the floor.  Pt required to ICU night of 12/24 for precedex gtt due to severe agitation and combative behavior.  PCCM was consulted.  Precedex felt to be ineffective and was turned off.  Pt was transferred back to floor on 1/4.  Hospital course remarkable for agitation, violent outbursts ?Psychiatry was consulted,making some medication changes.  They do not accept pt to inpatient psych here and recommend continued search for longer term placement.  Senior management involved.  Plan for placement on a group home. ? ?3/15-3/18-difficult placement ?3/19: Tmax 102.8 earlier this morning.  Chest x-ray showing right lower lobe pneumonia.  Started oral Augmentin ?3/20-yelling and agitated this morning ?3/21: Loose bowel movements ? ?Assessment and Plan: ?* Schizophrenia, chronic condition with acute exacerbation ([place]) ?Psychiatry is following him here due to ongoing safety issues related to patient's behavioral disturbances.  ?- Continue medication as per psychiatric recommendation.  Ativan continued at 2 mg every 4 hours due to his agitation spells ?- No inpatient psychiatric admission recommended ?- Pt asleep but arousable, not very conversant this AM ? ?Fall ?-on 3/24, noted to have fall w/o LOC while ambulating with staff ?-Noted to have fallen  on L knee. Pt reportedly had no subsequent knee pain and was able to ambulate without difficulty ?-On exam, no obvious sign of injury or bruising. ?-Xray was initially ordered, however as pt has no visible injury and is ambulating without any issues, that order was discontinued ? ?Aspiration pneumonia ([place]) ?Tmax 102.8 on 3/19.  Chest x-ray showing right lower lobe pneumonia suggestive of aspiration pneumonia as he is on high-risk for recurrent aspiration.  ?Completed course of augmentin as of 3/25 ? ?Constipation ?Apparently he was refusing prior bowel regimen. ?-Continue with bowel regimen-will do milk of magnesia as needed.  Recent KUB  was within normal limits ?Also has Imodium ordered.  ? ?Elevated CK ?Elevated CK most likely due to IM injections as per psych, less likely NMS.  ?- No changes in the current regimen. ? ? ?Stroke [facility]) ?Hx of stroke.  Seems to have significant aphasia, otherwise no apparent deficits. ?- Continue ASA, statin ? ?TBI (traumatic brain injury) ?Chronic ? ?Acute metabolic encephalopathy ?Currently at baseline. Head  CT f/u doesnot show any acute findings ?- Delirium precautions ?- Psych medications per psychiatry's orders. ? ? ?NSTEMI (non-ST elevated myocardial infarction) ([place]) ?elevated troponin (peaked to 23273), EKG with Q waves.Treated q 48 hrs heparin gtt ?- Continue aspirin, atorvastatin, metoprolol ?- Invasive evaluation deferred per cardiology.  ?- Monitoring for anginal signs or symptoms which are currently resolved ? ? ?DNR (do not resuscitate)/DNI(Do Not Intubate) ?EDP verified pt's DNR/DNI status with his mother ([place]). ? 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  Becoming very agitated and aggressive at times.  Ativan  recently increased to 2 mg every 4 hours per psychiatry.  He seem to have responded ?- Pursuing placement which is complicated. TOC and leadership involved in challenging disposition ? ?Acute pulmonary edema  (HCC)-resolved as of 01/31/2022 ?CXR with mild cardiomegaly with diffuse interstitial opacities suggesting pulm edema - atypical/viral infection not excluded. Negative influenza/covid. This was likely due to NSTEMI. ?- Resolved. ? ?Myocardial infarction (HCC)-resolved as of 02/20/2022 ?NSTEMI with elevated troponin (peaked to 23273), EKG with Q waves.Treated q 48 hrs heparin gtt ?- Continue aspirin, atorvastatin, metoprolol ?- Invasive evaluation deferred per cardiology.  ?- Monitoring for anginal signs or symptoms which are currently resolved ? ? ?  ? ?Subjective: Asleep, arouses, but not very conversant ? ?Physical Exam: ?Vitals:  ? 02/23/22 1207 02/23/22 1238 02/25/22 0039 02/25/22 1222  ?BP:  120/88 (!) 132/98 109/86  ?Pulse:  (!) 108 (!) 110 (!) 110  ?Resp: 16  16 18   ?Temp:  (!) 97.4 ?F (36.3 ?C) 98.1 ?F (36.7 ?C) (!) 97.5 ?F (36.4 ?C)  ?TempSrc:  Oral Oral Oral  ?SpO2:  97% 99% 98%  ?Weight:      ?Height:      ? ?General exam: asleep, arousable, in no acute distress ?Respiratory system: normal chest rise, clear, no audible wheezing ?Cardiovascular system: regular rhythm, s1-s2 ?Gastrointestinal system: Nondistended, nontender, pos BS ?Central nervous system: No seizures, no tremors ?Extremities: No cyanosis, no joint deformities ?Skin: No rashes, no pallor ?Psychiatry: Unable to assess given mentation ? ? ?Data Reviewed: ? ?There are no new results to review at this time. ? ?Family Communication: Pt in room, family not at bedside ? ?Disposition: ?Status is: Inpatient ? ?Remains inpatient appropriate because: Severity of illness and disposition ? ? ? Planned Discharge Destination:  Unclear at this time ? ? ? ?Author: ?Marylu Lund, MD ?02/26/2022 1:41 PM ? ?For on call review [email or web address].  ?

## 2022-02-27 DIAGNOSIS — J810 Acute pulmonary edema: Secondary | ICD-10-CM | POA: Diagnosis not present

## 2022-02-27 DIAGNOSIS — F209 Schizophrenia, unspecified: Secondary | ICD-10-CM | POA: Diagnosis not present

## 2022-02-27 NOTE — Progress Notes (Signed)
Patient kept coming out of room and being noncompliant. He would not listen to sitter, nurse or other staff. He started yelling. One of the staff members was trying to help redirect patient back to his room. The patient was upset about the Trudee machine and then was upset at a lady that was going around changing batteries on the hand sanitizer monitors. He didn't want it placed back in his room. He ran out of room past staff and he took the hand sanitizer monitor and was trying to break it. He was running around the unit with it. Security was called and they were on the unit with him as well. Patient was still trying to run, screaming and was being rough with security. Gave patient Haldol and Benadryl IM. Security stayed with patient until he calmed down.  ?
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
## 2022-02-28 LAB — CBC WITH DIFFERENTIAL/PLATELET
Abs Immature Granulocytes: 0.04 10*3/uL (ref 0.00–0.07)
Basophils Absolute: 0.1 10*3/uL (ref 0.0–0.1)
Basophils Relative: 1 %
Eosinophils Absolute: 0 10*3/uL (ref 0.0–0.5)
Eosinophils Relative: 0 %
HCT: 38.4 % — ABNORMAL LOW (ref 39.0–52.0)
Hemoglobin: 12.8 g/dL — ABNORMAL LOW (ref 13.0–17.0)
Immature Granulocytes: 1 %
Lymphocytes Relative: 25 %
Lymphs Abs: 1.9 10*3/uL (ref 0.7–4.0)
MCH: 30.4 pg (ref 26.0–34.0)
MCHC: 33.3 g/dL (ref 30.0–36.0)
MCV: 91.2 fL (ref 80.0–100.0)
Monocytes Absolute: 0.5 10*3/uL (ref 0.1–1.0)
Monocytes Relative: 7 %
Neutro Abs: 4.8 10*3/uL (ref 1.7–7.7)
Neutrophils Relative %: 66 %
Platelets: 343 10*3/uL (ref 150–400)
RBC: 4.21 MIL/uL — ABNORMAL LOW (ref 4.22–5.81)
RDW: 12.5 % (ref 11.5–15.5)
WBC: 7.3 10*3/uL (ref 4.0–10.5)
nRBC: 0 % (ref 0.0–0.2)

## 2022-02-28 NOTE — TOC Progression Note (Signed)
Transition of Care (TOC) - Progression Note  ? ? ?Patient Details  ?Name: Adam [name] ?MRN: [id number] ?Date of Birth: Mar 27, 1961 ? ?Transition of Care (TOC) CM/SW Contact  ?Handley Cellar, RN ?Phone Number: ?02/28/2022, 2:11 PM ? ?Clinical Narrative:    ?LVMM for Program manager Love Bruener [phone] requesting update on wait list for [facility], [place] and [place]. Updated on improvements in behaviors with structure of new room placement. Offered to send updated notes.  ? ? ?Expected Discharge Plan: Group Home ?Barriers to Discharge: Continued Medical Work up ? ?Expected Discharge Plan and Services ?Expected Discharge Plan: Group Home ?In-house Referral: Clinical Social Work ?  ?  ?Living arrangements for the past 2 months: Group Home ?                ?  ?  ?  ?  ?  ?  ?  ?  ?  ?  ? ? ?Social Determinants of Health (SDOH) Interventions ?  ? ?Readmission Risk Interventions ?   ? View : No data to display.  ?  ?  ?  ? ? ?

## 2022-02-28 NOTE — Progress Notes (Signed)
?PROGRESS NOTE ? ? ? ?Adam [name]  RCV:[id number] DOB: 08-02-1961 DOA: 07/20/2021 ?PCP: Pcp, No  ? ?Assessment & Plan: ?  ?Principal Problem: ?  Schizophrenia, chronic condition with acute exacerbation (HCC) ?Active Problems: ?  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  DNR (do not resuscitate)/DNI(Do Not Intubate) ?  NSTEMI (non-ST elevated myocardial infarction) (HCC) ?  Acute metabolic encephalopathy ?  TBI (traumatic brain injury) ?  Stroke [facility]) ?  Elevated CK ?  Constipation ?  Aspiration pneumonia (HCC) ?  Fall ? ? ?Schizophrenia: chronic condition with acute exacerbation. Psychiatry is following him here due to ongoing safety issues related to patient's behavioral disturbances. Continue on ativan as per psych  ? ?Fall: on 3/24, noted to have fall w/o LOC while ambulating with staff. Noted to have fallen on L knee. Pt reportedly had no subsequent knee pain and was able to ambulate without difficulty ?  ?Aspiration pneumonia: completed abx course. High risk for recurrent aspiration  ?  ?Constipation: continue on milk of mg  ?  ?Elevated CK: likely secondary to IM injections as per psych  ?  ?Hx of CVA: significant aphasia. Continue on aspirin, statin  ?  ?TBI: chronic  ?  ?Acute metabolic encephalopathy: at baseline. Resolved  ?  ?NSTEMI: completed IV heparin x 48hrs. Continue on aspirin, statin, metoprolol. Invasive evaluation deferred as per cardio  ?  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 Becoming very agitated and aggressive at times.  Ativan prn. Pursuing placement which is complicated. TOC and leadership involved in challenging disposition ?  ?Acute pulmonary edema: resolved as of 01/31/2022 ? ? ? ?DVT prophylaxis: SCDs ?Code Status: DNR ?Family Communication:  ?Disposition Plan: unknown  ? ?Level of care: Med-Surg ? ?Status is: Inpatient ? ?Remains inpatient appropriate because: see all above ? ? ?Consultants:   ?psych ? ?Procedures: ? ?Antimicrobials:  ? ? ?Subjective: ?Pt denies any complaints  ? ?Objective: ?Vitals:  ? 02/23/22 1238 02/25/22 0039 02/25/22 1222 02/27/22 1300  ?BP: 120/88 (!) 132/98 109/86 (!) 129/100  ?Pulse: (!) 108 (!) 110 (!) 110 (!) 108  ?Resp:  16 18 18   ?Temp:  98.1 ?F (36.7 ?C) (!) 97.5 ?F (36.4 ?C) 98 ?F (36.7 ?C)  ?TempSrc: Oral Oral Oral Oral  ?SpO2: 97% 99% 98% 97%  ?Weight:      ?Height:      ? ?No intake or output data in the 24 hours ending 02/28/22 0735 ?Filed Weights  ? 07/20/21 1635 12/07/21 2025 02/16/22 0229  ?Weight: 79.4 kg 80 kg 85.8 kg  ? ? ?Examination: ? ?No PE was done today.  ? ? ? ?Data Reviewed: I have personally reviewed following labs and imaging studies ? ?CBC: ?Recent Labs  ?Lab 02/21/22 ?1423  ?WBC 4.0  ?NEUTROABS 2.3  ?HGB 12.4*  ?HCT 37.4*  ?MCV 92.6  ?PLT 188  ? ?Basic Metabolic Panel: ?No results for input(s): NA, K, CL, CO2, GLUCOSE, BUN, CREATININE, CALCIUM, MG, PHOS in the last 168 hours. ?GFR: ?Estimated Creatinine Clearance: 74.9 mL/min (by C-G formula based on SCr of 1.09 mg/dL). ?Liver Function Tests: ?No results for input(s): AST, ALT, ALKPHOS, BILITOT, PROT, ALBUMIN in the last 168 hours. ?No results for input(s): LIPASE, AMYLASE in the last 168 hours. ?No results for input(s): AMMONIA in the last 168 hours. ?Coagulation Profile: ?No results for input(s): INR, PROTIME in the last 168 hours. ?Cardiac Enzymes: ?No results for input(s): CKTOTAL, CKMB, CKMBINDEX, TROPONINI in the last 168 hours. ?BNP (last 3  results) ?No results for input(s): PROBNP in the last 8760 hours. ?HbA1C: ?No results for input(s): HGBA1C in the last 72 hours. ?CBG: ?No results for input(s): GLUCAP in the last 168 hours. ?Lipid Profile: ?No results for input(s): CHOL, HDL, LDLCALC, TRIG, CHOLHDL, LDLDIRECT in the last 72 hours. ?Thyroid Function Tests: ?No results for input(s): TSH, T4TOTAL, FREET4, T3FREE, THYROIDAB in the last 72 hours. ?Anemia Panel: ?No results for input(s):  VITAMINB12, FOLATE, FERRITIN, TIBC, IRON, RETICCTPCT in the last 72 hours. ?Sepsis Labs: ?No results for input(s): PROCALCITON, LATICACIDVEN in the last 168 hours. ? ?Recent Results (from the past 240 hour(s))  ?Respiratory (~20 pathogens) panel by PCR     Status: None  ? Collection Time: 02/18/22  5:22 PM  ? Specimen: Nasopharyngeal Swab; Respiratory  ?Result Value Ref Range Status  ? Adenovirus NOT DETECTED NOT DETECTED Final  ? Coronavirus 229E NOT DETECTED NOT DETECTED Final  ?  Comment: (NOTE) ?The Coronavirus on the Respiratory Panel, DOES NOT test for the novel  ?Coronavirus (2019 nCoV) ?  ? Coronavirus HKU1 NOT DETECTED NOT DETECTED Final  ? Coronavirus NL63 NOT DETECTED NOT DETECTED Final  ? Coronavirus OC43 NOT DETECTED NOT DETECTED Final  ? Metapneumovirus NOT DETECTED NOT DETECTED Final  ? Rhinovirus / Enterovirus NOT DETECTED NOT DETECTED Final  ? Influenza A NOT DETECTED NOT DETECTED Final  ? Influenza B NOT DETECTED NOT DETECTED Final  ? Parainfluenza Virus 1 NOT DETECTED NOT DETECTED Final  ? Parainfluenza Virus 2 NOT DETECTED NOT DETECTED Final  ? Parainfluenza Virus 3 NOT DETECTED NOT DETECTED Final  ? Parainfluenza Virus 4 NOT DETECTED NOT DETECTED Final  ? Respiratory Syncytial Virus NOT DETECTED NOT DETECTED Final  ? Bordetella pertussis NOT DETECTED NOT DETECTED Final  ? Bordetella Parapertussis NOT DETECTED NOT DETECTED Final  ? Chlamydophila pneumoniae NOT DETECTED NOT DETECTED Final  ? Mycoplasma pneumoniae NOT DETECTED NOT DETECTED Final  ?  Comment: Performed at [facility], [address] [place], [place] [address]  ?Gastrointestinal Panel by PCR , Stool     Status: None  ? Collection Time: 02/19/22  3:02 PM  ? Specimen: Stool  ?Result Value Ref Range Status  ? Campylobacter species NOT DETECTED NOT DETECTED Final  ? Plesimonas shigelloides NOT DETECTED NOT DETECTED Final  ? Salmonella species NOT DETECTED NOT DETECTED Final  ? Yersinia enterocolitica NOT DETECTED NOT DETECTED  Final  ? Vibrio species NOT DETECTED NOT DETECTED Final  ? Vibrio cholerae NOT DETECTED NOT DETECTED Final  ? Enteroaggregative E coli (EAEC) NOT DETECTED NOT DETECTED Final  ? Enteropathogenic E coli (EPEC) NOT DETECTED NOT DETECTED Final  ? Enterotoxigenic E coli (ETEC) NOT DETECTED NOT DETECTED Final  ? Shiga like toxin producing E coli (STEC) NOT DETECTED NOT DETECTED Final  ? Shigella/Enteroinvasive E coli (EIEC) NOT DETECTED NOT DETECTED Final  ? Cryptosporidium NOT DETECTED NOT DETECTED Final  ? Cyclospora cayetanensis NOT DETECTED NOT DETECTED Final  ? Entamoeba histolytica NOT DETECTED NOT DETECTED Final  ? Giardia lamblia NOT DETECTED NOT DETECTED Final  ? Adenovirus F40/41 NOT DETECTED NOT DETECTED Final  ? Astrovirus NOT DETECTED NOT DETECTED Final  ? Norovirus GI/GII NOT DETECTED NOT DETECTED Final  ? Rotavirus A NOT DETECTED NOT DETECTED Final  ? Sapovirus (I, II, IV, and V) NOT DETECTED NOT DETECTED Final  ?  Comment: Performed at [facility], [address], [place], [place] [address]  ?  ? ? ? ? ? ?Radiology Studies: ?No results found. ? ? ? ? ? ?  Scheduled Meds: ? aspirin  81 mg Oral Daily  ? atorvastatin  20 mg Oral Daily  ? benztropine  0.5 mg Oral BID  ? clonazePAM  1 mg Oral TID  ? cloZAPine  350 mg Oral QHS  ? gabapentin  300 mg Oral TID  ? haloperidol  5 mg Oral TID  ? metoprolol succinate  25 mg Oral Daily  ? multivitamin with minerals  1 tablet Oral Daily  ? paliperidone  234 mg Intramuscular Q28 days  ? temazepam  15 mg Oral QHS  ? ?Continuous Infusions: ? ? LOS: 100 days  ? ? ?Time spent: 10 mins  ? ? ? ?Charise Killian, MD ?[facility] ?Pager [phone] ? ?If 7PM-7AM, please contact night-coverage ?02/28/2022, 7:35 AM  ? ?

## 2022-03-01 DIAGNOSIS — F209 Schizophrenia, unspecified: Secondary | ICD-10-CM | POA: Diagnosis not present

## 2022-03-01 LAB — CBC WITH DIFFERENTIAL/PLATELET
Abs Immature Granulocytes: 0.04 10*3/uL (ref 0.00–0.07)
Basophils Absolute: 0 10*3/uL (ref 0.0–0.1)
Basophils Relative: 1 %
Eosinophils Absolute: 0 10*3/uL (ref 0.0–0.5)
Eosinophils Relative: 0 %
HCT: 40.3 % (ref 39.0–52.0)
Hemoglobin: 13.4 g/dL (ref 13.0–17.0)
Immature Granulocytes: 1 %
Lymphocytes Relative: 14 %
Lymphs Abs: 1.2 10*3/uL (ref 0.7–4.0)
MCH: 30.5 pg (ref 26.0–34.0)
MCHC: 33.3 g/dL (ref 30.0–36.0)
MCV: 91.6 fL (ref 80.0–100.0)
Monocytes Absolute: 0.5 10*3/uL (ref 0.1–1.0)
Monocytes Relative: 6 %
Neutro Abs: 6.3 10*3/uL (ref 1.7–7.7)
Neutrophils Relative %: 78 %
Platelets: 359 10*3/uL (ref 150–400)
RBC: 4.4 MIL/uL (ref 4.22–5.81)
RDW: 12.6 % (ref 11.5–15.5)
WBC: 8 10*3/uL (ref 4.0–10.5)
nRBC: 0 % (ref 0.0–0.2)
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
## 2022-03-01 NOTE — Progress Notes (Signed)
Patient resting quietly in bed.
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
## 2022-03-01 NOTE — Progress Notes (Signed)
Entered patient's room with security officer to allow patient to go outside for awhile. Patient noted to be lying in bed with eyes closed. Patient refused multiple times to get up and go for walk outside. Patient made aware this would be his only opportunity today to go outside. Patient continued to refuse.  ?

## 2022-03-01 NOTE — Progress Notes (Signed)
Patient took morning medications without difficulty. No signs of cheeking noted. Patient pleasant and cooperative.  ?
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
## 2022-03-02 DIAGNOSIS — F209 Schizophrenia, unspecified: Secondary | ICD-10-CM | POA: Diagnosis not present

## 2022-03-02 MED ORDER — CLOZAPINE 25 MG PO TABS
375.0000 mg | ORAL_TABLET | Freq: Every day | ORAL | Status: DC
  Administered 2022-03-02 – 2022-03-06 (×5): 375 mg via ORAL
  Filled 2022-03-02 (×5): qty 3

## 2022-03-02 NOTE — Progress Notes (Signed)
?PROGRESS NOTE ? ?Adam [name]  TKP:[id number] DOB: 27-[name]-1961 DOA: 07/20/2021 ?PCP: Pcp, No  ? ?Brief Narrative: ? ?61 yo WM with hx of schizophrenia and TBI, who was initially admitted to [facility] ER on July 20, 2021. He spent more than 123 days in the [facility] ED awaiting psychiatric placement.  He was initially brought to [facility] ER due to violent outbursts at his group home.  He was IVC'd initially.  He was noted to be lethargic 12/19, found to have NSTEMI.  Pt was admitted to hospitalist service.  Cardiology consulted who recommended no intervention.   ? After admission to the medical service patient became severely agitated on the floor.  Pt required to ICU night of 12/24 for precedex gtt due to severe agitation and combative behavior.  PCCM was consulted.  Precedex felt to be ineffective and was turned off.  Pt was transferred back to floor on 1/4.  Hospital course remarkable for agitation, violent outbursts. Psychiatry was consulted,making some medication changes.  They do not accept pt to inpatient psych here and recommend continued search for longer term placement.  Senior management involved.  Plan for placement on a group home. ? Patient has been here 200 +  days so  far.  He is difficult to place. ? ?Assessment & Plan: ? ?Principal Problem: ?  Schizophrenia, chronic condition with acute exacerbation (HCC) ?Active Problems: ?  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  DNR (do not resuscitate)/DNI(Do Not Intubate) ?  NSTEMI (non-ST elevated myocardial infarction) (HCC) ?  Acute metabolic encephalopathy ?  TBI (traumatic brain injury) ?  Stroke [facility]) ?  Elevated CK ?  Constipation ?  Aspiration pneumonia (HCC) ?  Fall ? ? ?Schizophrenia: chronic condition with acute exacerbation. Psychiatry followed periodically here due to ongoing safety issues related to patient's behavioral disturbances. Continue on ativan as per psych.  Psychiatry last visit appears to be on 2/23. ?  ?Fall: on  3/24, noted to have fall w/o LOC while ambulating with staff. Noted to have fallen on L knee. Pt reportedly had no subsequent knee pain and was able to ambulate without difficulty ?  ?Aspiration pneumonia: completed abx course. High risk for recurrent aspiration  ?  ?Constipation: continue on milk of mg.  Last BM appears to be on 3/27. ?  ?Elevated CK: likely secondary to IM injections as per psych.  No CKs checked since 2/20 when it was 1629.  Will check CMP and CK in a.m. ?  ?Hx of CVA: significant aphasia. Continue on aspirin, statin  ?  ?TBI: chronic  ?  ?Acute metabolic encephalopathy: at baseline. Resolved  ?  ?NSTEMI: completed IV heparin x 48hrs. Continue on aspirin, statin, metoprolol. Invasive evaluation deferred as per cardio  ?  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 Becoming very agitated and aggressive at times.  Ativan prn. Pursuing placement which is complicated. TOC and leadership involved in challenging disposition ?  ?Acute pulmonary edema: resolved as of 01/31/2022 ?  ? ?  ?  ? ?DVT prophylaxis:Place and maintain sequential compression device Start: 02/28/22 1308 ?SCDs Start: 11/20/21 2032 ? ? ?  Code Status: DNR ? ?Family Communication: None ? ?Patient status:Inopatient ? ?Patient is from :Home ? ?Anticipated discharge NT:ZGYFV home ? ?Estimated DC date:Not sure ? ? ?Consultants: Cardiology,psych ? ?Procedures:None ? ?Antimicrobials:  ?Anti-infectives (From admission, onward)  ? ? Start     Dose/Rate Route Frequency Ordered Stop  ? 02/19/22 2200  amoxicillin-clavulanate (AUGMENTIN) 875-125 MG per tablet  1 tablet       ? 1 tablet Oral Every 12 hours 02/19/22 1140 02/24/22 1042  ? 02/18/22 1000  amoxicillin-clavulanate (AUGMENTIN) 875-125 MG per tablet 1 tablet  Status:  Discontinued       ? 1 tablet Oral Every 12 hours 02/18/22 0803 02/19/22 1140  ? ?  ? ? ?Subjective: ? ?Patient seen and  at the bedside.  Sitting on the edge of the bed, eating his breakfast.   Sitter at the bedside.  Comfortable ? ? ?Objective: ?Vitals:  ? 02/25/22 1222 02/27/22 1300 02/28/22 1616 03/01/22 0932  ?BP: 109/86 (!) 129/100 117/81 106/71  ?Pulse: (!) 110 (!) 108 85 (!) 105  ?Resp: 18 18 17 18   ?Temp: (!) 97.5 ?F (36.4 ?C) 98 ?F (36.7 ?C) 98.2 ?F (36.8 ?C)   ?TempSrc: Oral Oral    ?SpO2: 98% 97% 100% 100%  ?Weight:      ?Height:      ? ? ?Intake/Output Summary (Last 24 hours) at 03/02/2022 0939 ?Last data filed at 03/01/2022 2321 ?Gross per 24 hour  ?Intake 720 ml  ?Output --  ?Net 720 ml  ? ?Filed Weights  ? 07/20/21 1635 12/07/21 2025 02/16/22 0229  ?Weight: 79.4 kg 80 kg 85.8 kg  ? ? ?Examination: ? ?General exam: Overall comfortable, not in distress, sitting at the edge of bed, eating breakfast without any distress, obese ? ?Data Reviewed: I have personally reviewed following labs and imaging studies ? ?CBC: ?Recent Labs  ?Lab 02/28/22 ?2144 03/01/22 ?1328  ?WBC 7.3 8.0  ?NEUTROABS 4.8 6.3  ?HGB 12.8* 13.4  ?HCT 38.4* 40.3  ?MCV 91.2 91.6  ?PLT 343 359  ? ?Basic Metabolic Panel: ?No results for input(s): NA, K, CL, CO2, GLUCOSE, BUN, CREATININE, CALCIUM, MG, PHOS in the last 168 hours. ? ? ?No results found for this or any previous visit (from the past 240 hour(s)).  ? ?Radiology Studies: ?No results found. ? ?Scheduled Meds: ? aspirin  81 mg Oral Daily  ? atorvastatin  20 mg Oral Daily  ? benztropine  0.5 mg Oral BID  ? clonazePAM  1 mg Oral TID  ? cloZAPine  350 mg Oral QHS  ? gabapentin  300 mg Oral TID  ? haloperidol  5 mg Oral TID  ? metoprolol succinate  25 mg Oral Daily  ? multivitamin with minerals  1 tablet Oral Daily  ? paliperidone  234 mg Intramuscular Q28 days  ? temazepam  15 mg Oral QHS  ? ?Continuous Infusions: ? ? LOS: 102 days  ? ?03/03/22, MD ?[facility] ?P3/31/2023, 9:39 AM   ?

## 2022-03-02 NOTE — Consult Note (Signed)
?  Psychiatry follow-up.  Patient largely unchanged clinically although I do think we have had fewer episodes of the kind of agitation that create emergencies in the last week or 2.  I note that he had a fall the other day but mostly is remaining stable on his feet.  Blood pressure a little on the low side.  I am going to go ahead and increase his clozapine to 375 mg tonight.  After the weekend if he is doing okay we can try going up to 400 at which point I would probably check another level. ?

## 2022-03-03 DIAGNOSIS — K5900 Constipation, unspecified: Secondary | ICD-10-CM

## 2022-03-03 DIAGNOSIS — J690 Pneumonitis due to inhalation of food and vomit: Secondary | ICD-10-CM | POA: Diagnosis not present

## 2022-03-03 DIAGNOSIS — F209 Schizophrenia, unspecified: Secondary | ICD-10-CM | POA: Diagnosis not present

## 2022-03-03 DIAGNOSIS — Z66 Do not resuscitate: Secondary | ICD-10-CM | POA: Diagnosis not present

## 2022-03-03 LAB — COMPREHENSIVE METABOLIC PANEL
ALT: 33 U/L (ref 0–44)
AST: 14 U/L — ABNORMAL LOW (ref 15–41)
Albumin: 3.7 g/dL (ref 3.5–5.0)
Alkaline Phosphatase: 112 U/L (ref 38–126)
Anion gap: 7 (ref 5–15)
BUN: 29 mg/dL — ABNORMAL HIGH (ref 8–23)
CO2: 28 mmol/L (ref 22–32)
Calcium: 9.9 mg/dL (ref 8.9–10.3)
Chloride: 106 mmol/L (ref 98–111)
Creatinine, Ser: 0.95 mg/dL (ref 0.61–1.24)
GFR, Estimated: >60 mL/min (ref >60)
Glucose, Bld: 109 mg/dL — ABNORMAL HIGH (ref 70–99)
Potassium: 4 mmol/L (ref 3.5–5.1)
Sodium: 141 mmol/L (ref 135–145)
Total Bilirubin: 0.5 mg/dL (ref 0.3–1.2)
Total Protein: 7.4 g/dL (ref 6.5–8.1)

## 2022-03-03 LAB — CK: Total CK: 77 U/L (ref 49–397)

## 2022-03-03 NOTE — Progress Notes (Signed)
?PROGRESS NOTE ? ? ? ?Adam [name]  XNT:[id number] DOB: 12-18-1960 DOA: 07/20/2021 ?PCP: Pcp, No  ? ?Brief Narrative:  ?61 yo WM with hx of schizophrenia and TBI, who was initially admitted to [facility] ER on July 20, 2021. He spent more than 123 days in the [facility] ED awaiting psychiatric placement.  He was initially brought to [facility] ER due to violent outbursts at his group home.  He was IVC'd initially.  He was noted to be lethargic 12/19, found to have NSTEMI.  Pt was admitted to hospitalist service.  Cardiology consulted who recommended no intervention.   ? After admission to the medical service patient became severely agitated on the floor.  Pt required to ICU night of 12/24 for precedex gtt due to severe agitation and combative behavior.  PCCM was consulted.  Precedex felt to be ineffective and was turned off.  Pt was transferred back to floor on 1/4.  Hospital course remarkable for agitation, violent outbursts. Psychiatry was consulted,making some medication changes.  They do not accept pt to inpatient psych here and recommend continued search for longer term placement.  Senior management involved.  Plan for placement on a group home. ? Patient has been here 200 +  days so  far.  He is difficult to place. ? ? ?Assessment & Plan: ?  ?Principal Problem: ?  Schizophrenia, chronic condition with acute exacerbation (HCC) ?Active Problems: ?  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  DNR (do not resuscitate)/DNI(Do Not Intubate) ?  NSTEMI (non-ST elevated myocardial infarction) (HCC) ?  Acute metabolic encephalopathy ?  TBI (traumatic brain injury) (HCC) ?  Stroke [facility]) ?  Elevated CK ?  Constipation ?  Aspiration pneumonia (HCC) ?  Fall ? ? ?Schizophrenia: chronic condition with acute exacerbation. Psychiatry followed periodically here due to ongoing safety issues related to patient's behavioral disturbances. Continue on ativan as per psych.  Psychiatry reviewed recent clinical status,  increasing clozapine to 375, considering increaase to 400 next week ?  ?Fall: on 3/24, noted to have fall w/o LOC while ambulating with staff. Noted to have fallen on L knee. Pt reportedly had no subsequent knee pain and was able to ambulate without difficulty ?  ?Aspiration pneumonia: completed abx course. High risk for recurrent aspiration  ?  ?Constipation: continue on milk of mg ?  ?Elevated CK: likely secondary to IM injections as per psych.  No CKs checked since 2/20 when it was 1629.  Reviewed CMP and CK was 77. ?  ?Hx of CVA: significant aphasia. Continue on aspirin, statin  ?  ?TBI: chronic  ?  ?Acute metabolic encephalopathy: at baseline. Resolved  ?  ?NSTEMI: completed IV heparin x 48hrs. Continue on aspirin, statin, metoprolol. Invasive evaluation deferred as per cardio  ?  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 Becoming very agitated and aggressive at times.  Ativan prn. Pursuing placement which is complicated. TOC and leadership involved in challenging disposition ?  ?Acute pulmonary edema: resolved as of 01/31/2022 ?  ? ?DVT prophylaxis: SCD/Compression stockings  ?Code Status: dnr ? ?  ?Code Status Orders  ?(From admission, onward)  ?  ? ? ?  ? ?  Start     Ordered  ? 11/20/21 2032  Do not attempt resuscitation (DNR)  Continuous       ?Question Answer Comment  ?In the event of cardiac or respiratory ARREST Do not call a ?code blue?   ?In the event of cardiac or respiratory ARREST Do not  perform Intubation, CPR, defibrillation or ACLS   ?In the event of cardiac or respiratory ARREST Use medication by any route, position, wound care, and other measures to relive pain and suffering. May use oxygen, suction and manual treatment of airway obstruction as needed for comfort.   ?Comments EDP verified DNR/DNI status with pt's mother   ?  ? 11/20/21 2031  ? ?  ?  ? ?  ? ?Code Status History   ? ? Date Active Date Inactive Code Status Order ID Comments User Context  ? 07/20/2021  2026 11/20/2021 2032 Full Code [id number]  Delton Prairie, MD ED  ? ?  ? ?Family Communication: none today  ?Disposition Plan:   no safe d/c plan ?Consults called: None ?Admission status: Inpatient ? ? ?Consultants:  ?psychiatry ? ?Procedures:  ?DG Chest 2 View ? ?Result Date: 02/18/2022 ?CLINICAL DATA:  Fever and cough. EXAM: CHEST - 2 VIEW COMPARISON:  Chest x-ray dated November 20, 2021. FINDINGS: Unchanged cardiomegaly. Improved pulmonary vascular congestion compared to the prior study. New patchy asymmetric opacities in the right lower lobe with trace right pleural effusion. No pneumothorax. No acute osseous abnormality. IMPRESSION: 1. Right lower lobe pneumonia. Electronically Signed   By: Obie Dredge M.D.   On: 02/18/2022 10:29  ? ?DG Abd 1 View ? ?Result Date: 02/19/2022 ?CLINICAL DATA:  Fever EXAM: ABDOMEN - 1 VIEW COMPARISON:  None. FINDINGS: The bowel gas pattern is normal. No radio-opaque calculi or other significant radiographic abnormality are seen. IMPRESSION: Negative. Electronically Signed   By: Duanne Guess D.O.   On: 02/19/2022 15:10  ? ?CT HEAD WO CONTRAST ( ) ? ?Result Date: 02/01/2022 ?CLINICAL DATA:  Schizophrenia, traumatic brain injury, altered level of consciousness EXAM: CT HEAD WITHOUT CONTRAST TECHNIQUE: Contiguous axial images were obtained from the base of the skull through the vertex without intravenous contrast. RADIATION DOSE REDUCTION: This exam was performed according to the departmental dose-optimization program which includes automated exposure control, adjustment of the mA and/or kV according to patient size and/or use of iterative reconstruction technique. COMPARISON:  12/07/2021 FINDINGS: Brain: Chronic areas of encephalomalacia throughout the left cerebral hemisphere unchanged. No acute infarct or hemorrhage. Lateral ventricles and midline structures are unremarkable. No acute extra-axial fluid collections. No mass effect. Vascular: No hyperdense vessel or unexpected  calcification. Skull: Normal. Negative for fracture or focal lesion. Sinuses/Orbits: No acute finding. Other: None. IMPRESSION: 1. Stable head CT, no acute intracranial process. Electronically Signed   By: Sharlet Salina M.D.   On: 02/01/2022 17:58   ? ? ? ?Subjective: ?Resting on edge of bed, no concerns ?Staff reported some spitting up of meds ? ?Objective: ?Vitals:  ? 02/27/22 1300 02/28/22 1616 03/01/22 0932 03/02/22 2040  ?BP:  117/81 106/71 113/74  ?Pulse: (!) 108 85 (!) 105 86  ?Resp: 18 17 18 17   ?Temp: 98 ?F (36.7 ?C) 98.2 ?F (36.8 ?C)    ?TempSrc: Oral     ?SpO2: 97% 100% 100% 96%  ?Weight:      ?Height:      ? ? ?Intake/Output Summary (Last 24 hours) at 03/03/2022 1405 ?Last data filed at 03/03/2022 1308 ?Gross per 24 hour  ?Intake 980 ml  ?Output --  ?Net 980 ml  ? ?Filed Weights  ? 07/20/21 1635 12/07/21 2025 02/16/22 0229  ?Weight: 79.4 kg 80 kg 85.8 kg  ? ? ?Examination: ? ?General exam: Appears calm and comfortable  ?Respiratory system: Clear to auscultation. Respiratory effort normal. ?Cardiovascular system: S1 & S2 heard, RRR. No  JVD, murmurs, rubs, gallops or clicks. No pedal edema. ?Gastrointestinal system: Abdomen is nondistended, soft and nontender. No organomegaly or masses felt. Normal bowel sounds heard. ?Central nervous system: Alert and oriented. No focal neurological deficits. ?Extremities: Symmetric 5 x 5 power. ?Skin: No rashes, lesions or ulcers ?Psychiatry: stable,  nolability  ? ? ? ?Data Reviewed: I have personally reviewed following labs and imaging studies ? ?CBC: ?Recent Labs  ?Lab 02/28/22 ?2144 03/01/22 ?1328  ?WBC 7.3 8.0  ?NEUTROABS 4.8 6.3  ?HGB 12.8* 13.4  ?HCT 38.4* 40.3  ?MCV 91.2 91.6  ?PLT 343 359  ? ?Basic Metabolic Panel: ?Recent Labs  ?Lab 03/03/22 ?1125  ?NA 141  ?K 4.0  ?CL 106  ?CO2 28  ?GLUCOSE 109*  ?BUN 29*  ?CREATININE 0.95  ?CALCIUM 9.9  ? ?GFR: ?Estimated Creatinine Clearance: 86 mL/min (by C-G formula based on SCr of 0.95 mg/dL). ?Liver Function Tests: ?Recent  Labs  ?Lab 03/03/22 ?1125  ?AST 14*  ?ALT 33  ?ALKPHOS 112  ?BILITOT 0.5  ?PROT 7.4  ?ALBUMIN 3.7  ? ?No results for input(s): LIPASE, AMYLASE in the last 168 hours. ?No results for input(s): AMMONIA in the las

## 2022-03-03 NOTE — Progress Notes (Signed)
Patient seen spitting pills back into milk during medication pass. I was able to get patient to finish taking pthose pills, unable to determine if others were missed. MD notified. No new orders. ?

## 2022-03-03 NOTE — Plan of Care (Signed)
  Problem: Health Behavior/Discharge Planning: Goal: Ability to manage health-related needs will improve Outcome: Progressing   

## 2022-03-03 NOTE — Progress Notes (Signed)
Patient refused shift assessment ? ?

## 2022-03-03 NOTE — Progress Notes (Signed)
Pt spit his pills in his chocolate milk. I asked for the carton but he insisted on throwing it away. I retrieved it out of the garbage can and two of his pills were in the milk. It would probably be best to crush his pills to insure he swallows all of his meds.  ?

## 2022-03-04 DIAGNOSIS — J690 Pneumonitis due to inhalation of food and vomit: Secondary | ICD-10-CM | POA: Diagnosis not present

## 2022-03-04 DIAGNOSIS — K5900 Constipation, unspecified: Secondary | ICD-10-CM | POA: Diagnosis not present

## 2022-03-04 DIAGNOSIS — F209 Schizophrenia, unspecified: Secondary | ICD-10-CM | POA: Diagnosis not present

## 2022-03-04 DIAGNOSIS — Z66 Do not resuscitate: Secondary | ICD-10-CM | POA: Diagnosis not present

## 2022-03-04 NOTE — Progress Notes (Signed)
?PROGRESS NOTE ? ? ? ?Adam [name]  WIO:[id number] DOB: 10-Nov-1961 DOA: 07/20/2021 ?PCP: Pcp, No  ? ?Brief Narrative:  ?61 yo WM with hx of schizophrenia and TBI, who was initially admitted to [facility] ER on July 20, 2021. He spent more than 123 days in the [facility] ED awaiting psychiatric placement.  He was initially brought to [facility] ER due to violent outbursts at his group home.  He was IVC'd initially.  He was noted to be lethargic 12/19, found to have NSTEMI.  Pt was admitted to hospitalist service.  Cardiology consulted who recommended no intervention.   ? After admission to the medical service patient became severely agitated on the floor.  Pt required to ICU night of 12/24 for precedex gtt due to severe agitation and combative behavior.  PCCM was consulted.  Precedex felt to be ineffective and was turned off.  Pt was transferred back to floor on 1/4.  Hospital course remarkable for agitation, violent outbursts. Psychiatry was consulted,making some medication changes.  They do not accept pt to inpatient psych here and recommend continued search for longer term placement.  Senior management involved.  Plan for placement on a group home. ? Patient has been here 200 +  days so  far.  He is difficult to place. ? ? ?Assessment & Plan: ?  ?Principal Problem: ?  Schizophrenia, chronic condition with acute exacerbation (HCC) ?Active Problems: ?  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  DNR (do not resuscitate)/DNI(Do Not Intubate) ?  NSTEMI (non-ST elevated myocardial infarction) (HCC) ?  Acute metabolic encephalopathy ?  TBI (traumatic brain injury) (HCC) ?  Stroke [facility]) ?  Elevated CK ?  Constipation ?  Aspiration pneumonia (HCC) ?  Fall ? ? ?Schizophrenia: chronic condition with acute exacerbation. Psychiatry followed periodically here due to ongoing safety issues related to patient's behavioral disturbances. Continue on ativan as per psych.  Psychiatry reviewed recent clinical status,  increasing clozapine to 375, considering increaase to 400 next week ?  ?Fall: on 3/24, noted to have fall w/o LOC while ambulating with staff. Noted to have fallen on L knee. Pt reportedly had no subsequent knee pain and was able to ambulate without difficulty ?  ?Aspiration pneumonia: completed abx course. High risk for recurrent aspiration  ?  ?Constipation: continue on milk of mg ?  ?Elevated CK: likely secondary to IM injections as per psych.  No CKs checked since 2/20 when it was 1629.  Reviewed CMP and CK was 77. ?  ?Hx of CVA: significant aphasia. Continue on aspirin, statin  ?  ?TBI: chronic  ?  ?Acute metabolic encephalopathy: at baseline. Resolved  ?  ?NSTEMI: completed IV heparin x 48hrs. Continue on aspirin, statin, metoprolol. Invasive evaluation deferred as per cardio  ?  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 Becoming very agitated and aggressive at times.  Ativan prn. Pursuing placement which is complicated. TOC and leadership involved in challenging disposition ?  ?Acute pulmonary edema: resolved as of 01/31/2022 ?  ?  ?DVT prophylaxis: SCD/Compression stockings  ?Code Status: dnr ? ? ?  ?Code Status Orders  ?(From admission, onward)  ?  ? ? ?  ? ?  Start     Ordered  ? 11/20/21 2032  Do not attempt resuscitation (DNR)  Continuous       ?Question Answer Comment  ?In the event of cardiac or respiratory ARREST Do not call a ?code blue?   ?In the event of cardiac or respiratory ARREST  Do not perform Intubation, CPR, defibrillation or ACLS   ?In the event of cardiac or respiratory ARREST Use medication by any route, position, wound care, and other measures to relive pain and suffering. May use oxygen, suction and manual treatment of airway obstruction as needed for comfort.   ?Comments EDP verified DNR/DNI status with pt's mother   ?  ? 11/20/21 2031  ? ?  ?  ? ?  ? ?Code Status History   ? ? Date Active Date Inactive Code Status Order ID Comments User Context  ?  07/20/2021 2026 11/20/2021 2032 Full Code [id number]  Delton Prairie, MD ED  ? ?  ? ?Family Communication: none today  ?Disposition Plan:   no safe d/c plan ?Consults called: None ?Admission status: Inpatient ? ? ?Consultants:  ?psychiatry ? ?Procedures:  ?DG Chest 2 View ? ?Result Date: 02/18/2022 ?CLINICAL DATA:  Fever and cough. EXAM: CHEST - 2 VIEW COMPARISON:  Chest x-ray dated November 20, 2021. FINDINGS: Unchanged cardiomegaly. Improved pulmonary vascular congestion compared to the prior study. New patchy asymmetric opacities in the right lower lobe with trace right pleural effusion. No pneumothorax. No acute osseous abnormality. IMPRESSION: 1. Right lower lobe pneumonia. Electronically Signed   By: Obie Dredge M.D.   On: 02/18/2022 10:29  ? ?DG Abd 1 View ? ?Result Date: 02/19/2022 ?CLINICAL DATA:  Fever EXAM: ABDOMEN - 1 VIEW COMPARISON:  None. FINDINGS: The bowel gas pattern is normal. No radio-opaque calculi or other significant radiographic abnormality are seen. IMPRESSION: Negative. Electronically Signed   By: Duanne Guess D.O.   On: 02/19/2022 15:10   ? ? ? ? ?Subjective: ?Again resting on edge of bed ?No acute findings or events ?Intermittent outbursts which appears to be baseline ? ?Objective: ?Vitals:  ? 02/27/22 1300 02/28/22 1616 03/01/22 0932 03/02/22 2040  ?BP:   106/71 113/74  ?Pulse: (!) 108 85 (!) 105 86  ?Resp: 18 17 18 17   ?Temp: 98 ?F (36.7 ?C) 98.2 ?F (36.8 ?C)    ?TempSrc: Oral     ?SpO2: 97% 100% 100% 96%  ?Weight:      ?Height:      ? ? ?Intake/Output Summary (Last 24 hours) at 03/04/2022 1348 ?Last data filed at 03/03/2022 2300 ?Gross per 24 hour  ?Intake 240 ml  ?Output --  ?Net 240 ml  ? ?Filed Weights  ? 07/20/21 1635 12/07/21 2025 02/16/22 0229  ?Weight: 79.4 kg 80 kg 85.8 kg  ? ? ?Examination: ? ?General exam: Appears calm and comfortable on my eval ?Respiratory system: Clear to auscultation. Respiratory effort normal. ?Cardiovascular system: S1 & S2 heard, RRR. No JVD, murmurs,  rubs, gallops or clicks. No pedal edema. ?Gastrointestinal system: Abdomen is nondistended, soft and nontender. No organomegaly or masses felt. Normal bowel sounds heard. ?Central nervous system: Alert and oriented. No focal neurological deficits. ?Extremities: WWP no edema ?Skin: No rashes, lesions or ulcers ?Psychiatry: stable, no lability on my visit ? ? ? ?Data Reviewed: I have personally reviewed following labs and imaging studies ? ?CBC: ?Recent Labs  ?Lab 02/28/22 ?2144 03/01/22 ?1328  ?WBC 7.3 8.0  ?NEUTROABS 4.8 6.3  ?HGB 12.8* 13.4  ?HCT 38.4* 40.3  ?MCV 91.2 91.6  ?PLT 343 359  ? ?Basic Metabolic Panel: ?Recent Labs  ?Lab 03/03/22 ?1125  ?NA 141  ?K 4.0  ?CL 106  ?CO2 28  ?GLUCOSE 109*  ?BUN 29*  ?CREATININE 0.95  ?CALCIUM 9.9  ? ?GFR: ?Estimated Creatinine Clearance: 86 mL/min (by C-G formula based on  SCr of 0.95 mg/dL). ?Liver Function Tests: ?Recent Labs  ?Lab 03/03/22 ?1125  ?AST 14*  ?ALT 33  ?ALKPHOS 112  ?BILITOT 0.5  ?PROT 7.4  ?ALBUMIN 3.7  ? ?No results for input(s): LIPASE, AMYLASE in the last 168 hours. ?No results for input(s): AMMONIA in the last 168 hours. ?Coagulation Profile: ?No results for input(s): INR, PROTIME in the last 168 hours. ?Cardiac Enzymes: ?Recent Labs  ?Lab 03/03/22 ?1125  ?CKTOTAL 77  ? ?BNP (last 3 results) ?No results for input(s): PROBNP in the last 8760 hours. ?HbA1C: ?No results for input(s): HGBA1C in the last 72 hours. ?CBG: ?No results for input(s): GLUCAP in the last 168 hours. ?Lipid Profile: ?No results for input(s): CHOL, HDL, LDLCALC, TRIG, CHOLHDL, LDLDIRECT in the last 72 hours. ?Thyroid Function Tests: ?No results for input(s): TSH, T4TOTAL, FREET4, T3FREE, THYROIDAB in the last 72 hours. ?Anemia Panel: ?No results for input(s): VITAMINB12, FOLATE, FERRITIN, TIBC, IRON, RETICCTPCT in the last 72 hours. ?Sepsis Labs: ?No results for input(s): PROCALCITON, LATICACIDVEN in the last 168 hours. ? ?No results found for this or any previous visit (from the past  240 hour(s)).  ? ? ? ? ? ?Radiology Studies: ?No results found. ? ? ? ? ? ?Scheduled Meds: ? aspirin  81 mg Oral Daily  ? atorvastatin  20 mg Oral Daily  ? benztropine  0.5 mg Oral BID  ? clonazePAM  1 mg Oral TID  ?

## 2022-03-05 DIAGNOSIS — F209 Schizophrenia, unspecified: Secondary | ICD-10-CM | POA: Diagnosis not present

## 2022-03-05 NOTE — Progress Notes (Signed)
?PROGRESS NOTE ? ? ? ?Adam [name]  LZJ:[id number] DOB: 1960-12-05 DOA: 07/20/2021 ?PCP: Pcp, No  ? ?Brief Narrative:  ?61 yo WM with hx of schizophrenia and TBI, who was initially admitted to [name] ER on July 20, 2021. He spent more than 123 days in the [facility] ED awaiting psychiatric placement.  He was initially brought to [facility] ER due to violent outbursts at his group home.  He was IVC'd initially.  He was noted to be lethargic 12/19, found to have NSTEMI.  Pt was admitted to hospitalist service.  Cardiology consulted who recommended no intervention.   ? After admission to the medical service patient became severely agitated on the floor.  Pt required to ICU night of 12/24 for precedex gtt due to severe agitation and combative behavior.  PCCM was consulted.  Precedex felt to be ineffective and was turned off.  Pt was transferred back to floor on 1/4.  Hospital course remarkable for agitation, violent outbursts. Psychiatry was consulted,making some medication changes.  They do not accept pt to inpatient psych here and recommend continued search for longer term placement.  Senior management involved.  Plan for placement on a group home. ? Patient has been here 200 +  days so  far.  He is difficult to place. ? ? ?Assessment & Plan: ?  ?Principal Problem: ?  Schizophrenia, chronic condition with acute exacerbation (HCC) ?Active Problems: ?  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  DNR (do not resuscitate)/DNI(Do Not Intubate) ?  NSTEMI (non-ST elevated myocardial infarction) (HCC) ?  Acute metabolic encephalopathy ?  TBI (traumatic brain injury) (HCC) ?  Stroke [facility]) ?  Elevated CK ?  Constipation ?  Aspiration pneumonia (HCC) ?  Fall ? ? ?Schizophrenia: chronic condition with acute exacerbation. Psychiatry followed periodically here due to ongoing safety issues related to patient's behavioral disturbances. Continue on ativan as per psych.  Psychiatry reviewed recent clinical status,  increasing clozapine to 375, considering increaase to 400 next week ?  ?Fall: on 3/24, noted to have fall w/o LOC while ambulating with staff. Noted to have fallen on L knee. Pt reportedly had no subsequent knee pain and was able to ambulate without difficulty ?  ?Aspiration pneumonia: completed abx course. High risk for recurrent aspiration  ?  ?Constipation: continue on milk of mg ?  ?Elevated CK: likely secondary to IM injections as per psych.  No CKs checked since 2/20 when it was 1629.  Reviewed CMP and CK was 77. ?  ?Hx of CVA: significant aphasia. Continue on aspirin, statin  ?  ?TBI: chronic  ?  ?Acute metabolic encephalopathy: at baseline. Resolved  ?  ?NSTEMI: completed IV heparin x 48hrs. Continue on aspirin, statin, metoprolol. Invasive evaluation deferred as per cardio  ?  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 Becoming very agitated and aggressive at times.  Ativan prn. Pursuing placement which is complicated. TOC and leadership involved in challenging disposition ?  ?Acute pulmonary edema: resolved as of 01/31/2022 ?  ?  ?DVT prophylaxis: SCD/Compression stockings  ?Code Status: dnr ? ? ?  ?Code Status Orders  ?(From admission, onward)  ?  ? ? ?  ? ?  Start     Ordered  ? 11/20/21 2032  Do not attempt resuscitation (DNR)  Continuous       ?Question Answer Comment  ?In the event of cardiac or respiratory ARREST Do not call a ?code blue?   ?In the event of cardiac or respiratory ARREST  Do not perform Intubation, CPR, defibrillation or ACLS   ?In the event of cardiac or respiratory ARREST Use medication by any route, position, wound care, and other measures to relive pain and suffering. May use oxygen, suction and manual treatment of airway obstruction as needed for comfort.   ?Comments EDP verified DNR/DNI status with pt's mother   ?  ? 11/20/21 2031  ? ?  ?  ? ?  ? ?Code Status History   ? ? Date Active Date Inactive Code Status Order ID Comments User Context  ?  07/20/2021 2026 11/20/2021 2032 Full Code [id number]  Delton Prairie, MD ED  ? ?  ? ?Family Communication: none today  ?Disposition Plan:   no safe d/c plan ?Consults called: None ?Admission status: Inpatient ? ? ?Consultants:  ?psychiatry ? ?Procedures:  ?DG Chest 2 View ? ?Result Date: 02/18/2022 ?CLINICAL DATA:  Fever and cough. EXAM: CHEST - 2 VIEW COMPARISON:  Chest x-ray dated November 20, 2021. FINDINGS: Unchanged cardiomegaly. Improved pulmonary vascular congestion compared to the prior study. New patchy asymmetric opacities in the right lower lobe with trace right pleural effusion. No pneumothorax. No acute osseous abnormality. IMPRESSION: 1. Right lower lobe pneumonia. Electronically Signed   By: Obie Dredge M.D.   On: 02/18/2022 10:29  ? ?DG Abd 1 View ? ?Result Date: 02/19/2022 ?CLINICAL DATA:  Fever EXAM: ABDOMEN - 1 VIEW COMPARISON:  None. FINDINGS: The bowel gas pattern is normal. No radio-opaque calculi or other significant radiographic abnormality are seen. IMPRESSION: Negative. Electronically Signed   By: Duanne Guess D.O.   On: 02/19/2022 15:10   ? ? ?Subjective: ?Patient was resting comfortably.  No complaints.  Sitter at bedside. ? ?Objective: ?Vitals:  ? 03/02/22 2040 03/04/22 2108 03/05/22 1247 03/05/22 1542  ?BP:  102/76 114/83 114/79  ?Pulse: 86 88 99 93  ?Resp: 17 16 18 16   ?Temp:  97.6 ?F (36.4 ?C) 98.1 ?F (36.7 ?C) 97.6 ?F (36.4 ?C)  ?TempSrc:   Oral Oral  ?SpO2: 96% 98% 99% 100%  ?Weight:      ?Height:      ? ? ?Intake/Output Summary (Last 24 hours) at 03/05/2022 1712 ?Last data filed at 03/04/2022 1805 ?Gross per 24 hour  ?Intake 240 ml  ?Output --  ?Net 240 ml  ? ? ?Filed Weights  ? 07/20/21 1635 12/07/21 2025 02/16/22 0229  ?Weight: 79.4 kg 80 kg 85.8 kg  ? ? ?Examination: ? ?General.     In no acute distress. ?Pulmonary.  Lungs clear bilaterally, normal respiratory effort. ?CV.  Regular rate and rhythm, no JVD, rub or murmur. ?Abdomen.  Soft, nontender, nondistended, BS positive. ?CNS.   Somnolent, no focal neurologic deficit. ?Extremities.  No edema, no cyanosis, pulses intact and symmetrical. ?Psychiatry.  Judgment and insight appears impaired ? ? ?Data Reviewed: I have personally reviewed following labs and imaging studies ? ?CBC: ?Recent Labs  ?Lab 02/28/22 ?2144 03/01/22 ?1328  ?WBC 7.3 8.0  ?NEUTROABS 4.8 6.3  ?HGB 12.8* 13.4  ?HCT 38.4* 40.3  ?MCV 91.2 91.6  ?PLT 343 359  ? ? ?Basic Metabolic Panel: ?Recent Labs  ?Lab 03/03/22 ?1125  ?NA 141  ?K 4.0  ?CL 106  ?CO2 28  ?GLUCOSE 109*  ?BUN 29*  ?CREATININE 0.95  ?CALCIUM 9.9  ? ? ?GFR: ?Estimated Creatinine Clearance: 86 mL/min (by C-G formula based on SCr of 0.95 mg/dL). ?Liver Function Tests: ?Recent Labs  ?Lab 03/03/22 ?1125  ?AST 14*  ?ALT 33  ?ALKPHOS 112  ?BILITOT 0.5  ?  PROT 7.4  ?ALBUMIN 3.7  ? ? ?No results for input(s): LIPASE, AMYLASE in the last 168 hours. ?No results for input(s): AMMONIA in the last 168 hours. ?Coagulation Profile: ?No results for input(s): INR, PROTIME in the last 168 hours. ?Cardiac Enzymes: ?Recent Labs  ?Lab 03/03/22 ?1125  ?CKTOTAL 77  ? ? ?BNP (last 3 results) ?No results for input(s): PROBNP in the last 8760 hours. ?HbA1C: ?No results for input(s): HGBA1C in the last 72 hours. ?CBG: ?No results for input(s): GLUCAP in the last 168 hours. ?Lipid Profile: ?No results for input(s): CHOL, HDL, LDLCALC, TRIG, CHOLHDL, LDLDIRECT in the last 72 hours. ?Thyroid Function Tests: ?No results for input(s): TSH, T4TOTAL, FREET4, T3FREE, THYROIDAB in the last 72 hours. ?Anemia Panel: ?No results for input(s): VITAMINB12, FOLATE, FERRITIN, TIBC, IRON, RETICCTPCT in the last 72 hours. ?Sepsis Labs: ?No results for input(s): PROCALCITON, LATICACIDVEN in the last 168 hours. ? ?No results found for this or any previous visit (from the past 240 hour(s)).  ? ? ?Radiology Studies: ?No results found. ? ?Scheduled Meds: ? aspirin  81 mg Oral Daily  ? atorvastatin  20 mg Oral Daily  ? benztropine  0.5 mg Oral BID  ? clonazePAM  1  mg Oral TID  ? cloZAPine  375 mg Oral QHS  ? gabapentin  300 mg Oral TID  ? haloperidol  5 mg Oral TID  ? metoprolol succinate  25 mg Oral Daily  ? multivitamin with minerals  1 tablet Oral Daily  ? paliperidone

## 2022-03-06 DIAGNOSIS — F209 Schizophrenia, unspecified: Secondary | ICD-10-CM | POA: Diagnosis not present

## 2022-03-06 NOTE — Progress Notes (Signed)
?PROGRESS NOTE ? ? ? ?Adam [name]  WCB:[id number] DOB: Dec 26, 1960 DOA: 07/20/2021 ?PCP: Pcp, No  ? ?Brief Narrative:  ?61 yo WM with hx of schizophrenia and TBI, who was initially admitted to [facility] ER on July 20, 2021. He spent more than 123 days in the [facility] ED awaiting psychiatric placement.  He was initially brought to [name] ER due to violent outbursts at his group home.  He was IVC'd initially.  He was noted to be lethargic 12/19, found to have NSTEMI.  Pt was admitted to hospitalist service.  Cardiology consulted who recommended no intervention.   ? After admission to the medical service patient became severely agitated on the floor.  Pt required to ICU night of 12/24 for precedex gtt due to severe agitation and combative behavior.  PCCM was consulted.  Precedex felt to be ineffective and was turned off.  Pt was transferred back to floor on 1/4.  Hospital course remarkable for agitation, violent outbursts. Psychiatry was consulted,making some medication changes.  They do not accept pt to inpatient psych here and recommend continued search for longer term placement.  Senior management involved.  Plan for placement on a group home. ? Patient has been here 200 +  days so  far.  He is difficult to place. ? ? ?Assessment & Plan: ?  ?Principal Problem: ?  Schizophrenia, chronic condition with acute exacerbation (HCC) ?Active Problems: ?  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  DNR (do not resuscitate)/DNI(Do Not Intubate) ?  NSTEMI (non-ST elevated myocardial infarction) (HCC) ?  Acute metabolic encephalopathy ?  TBI (traumatic brain injury) (HCC) ?  Stroke [facility]) ?  Elevated CK ?  Constipation ?  Aspiration pneumonia (HCC) ?  Fall ? ? ?Schizophrenia: chronic condition with acute exacerbation. Psychiatry followed periodically here due to ongoing safety issues related to patient's behavioral disturbances. Continue on ativan as per psych.  Psychiatry reviewed recent clinical status,  increasing clozapine to 375, considering increaase to 400 next week ?  ?Fall: on 3/24, noted to have fall w/o LOC while ambulating with staff. Noted to have fallen on L knee. Pt reportedly had no subsequent knee pain and was able to ambulate without difficulty ?  ?Aspiration pneumonia: completed abx course. High risk for recurrent aspiration  ?  ?Constipation: continue on milk of mg as needed. ?  ?Elevated CK: likely secondary to IM injections as per psych.  No CKs checked since 2/20 when it was 1629.  Reviewed CMP and CK was 77. ?  ?Hx of CVA: significant aphasia. Continue on aspirin, statin  ?  ?TBI: chronic  ?  ?Acute metabolic encephalopathy: at baseline. Resolved  ?  ?NSTEMI: completed IV heparin x 48hrs. Continue on aspirin, statin, metoprolol. Invasive evaluation deferred as per cardio  ?  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 Becoming very agitated and aggressive at times.  Ativan prn. Pursuing placement which is complicated. TOC and leadership involved in challenging disposition ?  ?Acute pulmonary edema: resolved as of 01/31/2022 ?  ?  ?DVT prophylaxis: SCD/Compression stockings  ?Code Status: dnr ? ? ?  ?Code Status Orders  ?(From admission, onward)  ?  ? ? ?  ? ?  Start     Ordered  ? 11/20/21 2032  Do not attempt resuscitation (DNR)  Continuous       ?Question Answer Comment  ?In the event of cardiac or respiratory ARREST Do not call a ?code blue?   ?In the event of cardiac or  respiratory ARREST Do not perform Intubation, CPR, defibrillation or ACLS   ?In the event of cardiac or respiratory ARREST Use medication by any route, position, wound care, and other measures to relive pain and suffering. May use oxygen, suction and manual treatment of airway obstruction as needed for comfort.   ?Comments EDP verified DNR/DNI status with pt's mother   ?  ? 11/20/21 2031  ? ?  ?  ? ?  ? ?Code Status History   ? ? Date Active Date Inactive Code Status Order ID Comments User Context   ? 07/20/2021 2026 11/20/2021 2032 Full Code [id number]  Delton Prairie, MD ED  ? ?  ? ?Family Communication: none today  ?Disposition Plan:   no safe d/c plan ?Consults called: None ?Admission status: Inpatient ? ? ?Consultants:  ?psychiatry ? ?Procedures:  ?DG Chest 2 View ? ?Result Date: 02/18/2022 ?CLINICAL DATA:  Fever and cough. EXAM: CHEST - 2 VIEW COMPARISON:  Chest x-ray dated November 20, 2021. FINDINGS: Unchanged cardiomegaly. Improved pulmonary vascular congestion compared to the prior study. New patchy asymmetric opacities in the right lower lobe with trace right pleural effusion. No pneumothorax. No acute osseous abnormality. IMPRESSION: 1. Right lower lobe pneumonia. Electronically Signed   By: Obie Dredge M.D.   On: 02/18/2022 10:29  ? ?DG Abd 1 View ? ?Result Date: 02/19/2022 ?CLINICAL DATA:  Fever EXAM: ABDOMEN - 1 VIEW COMPARISON:  None. FINDINGS: The bowel gas pattern is normal. No radio-opaque calculi or other significant radiographic abnormality are seen. IMPRESSION: Negative. Electronically Signed   By: Duanne Guess D.O.   On: 02/19/2022 15:10   ? ? ?Subjective: ?Patient was seen and examined today.  Appears little agitated.  No other complaints. ? ?Objective: ?Vitals:  ? 03/04/22 2108 03/05/22 1247 03/05/22 1542 03/06/22 0811  ?BP: 102/76 114/83 114/79 118/87  ?Pulse: 88 99 93 (!) 110  ?Resp: 16 18 16 18   ?Temp: 97.6 ?F (36.4 ?C) 98.1 ?F (36.7 ?C) 97.6 ?F (36.4 ?C) 98.4 ?F (36.9 ?C)  ?TempSrc:  Oral Oral Oral  ?SpO2: 98% 99% 100% 97%  ?Weight:      ?Height:      ? ? ?Intake/Output Summary (Last 24 hours) at 03/06/2022 1648 ?Last data filed at 03/06/2022 1230 ?Gross per 24 hour  ?Intake 700 ml  ?Output --  ?Net 700 ml  ? ? ?Filed Weights  ? 07/20/21 1635 12/07/21 2025 02/16/22 0229  ?Weight: 79.4 kg 80 kg 85.8 kg  ? ? ?Examination: ? ?General.     In no acute distress. ?Pulmonary.  Lungs clear bilaterally, normal respiratory effort. ?CV.  Regular rate and rhythm, no JVD, rub or murmur. ?Abdomen.   Soft, nontender, nondistended, BS positive. ?CNS.  Alert and oriented x3.  No focal neurologic deficit. ?Extremities.  No edema, no cyanosis, pulses intact and symmetrical. ?Psychiatry.  Judgment and insight appears normal.  ? ?Data Reviewed: I have personally reviewed following labs and imaging studies ? ?CBC: ?Recent Labs  ?Lab 02/28/22 ?2144 03/01/22 ?1328  ?WBC 7.3 8.0  ?NEUTROABS 4.8 6.3  ?HGB 12.8* 13.4  ?HCT 38.4* 40.3  ?MCV 91.2 91.6  ?PLT 343 359  ? ? ?Basic Metabolic Panel: ?Recent Labs  ?Lab 03/03/22 ?1125  ?NA 141  ?K 4.0  ?CL 106  ?CO2 28  ?GLUCOSE 109*  ?BUN 29*  ?CREATININE 0.95  ?CALCIUM 9.9  ? ? ?GFR: ?Estimated Creatinine Clearance: 86 mL/min (by C-G formula based on SCr of 0.95 mg/dL). ?Liver Function Tests: ?Recent Labs  ?Lab 03/03/22 ?  1125  ?AST 14*  ?ALT 33  ?ALKPHOS 112  ?BILITOT 0.5  ?PROT 7.4  ?ALBUMIN 3.7  ? ? ?No results for input(s): LIPASE, AMYLASE in the last 168 hours. ?No results for input(s): AMMONIA in the last 168 hours. ?Coagulation Profile: ?No results for input(s): INR, PROTIME in the last 168 hours. ?Cardiac Enzymes: ?Recent Labs  ?Lab 03/03/22 ?1125  ?CKTOTAL 77  ? ? ?BNP (last 3 results) ?No results for input(s): PROBNP in the last 8760 hours. ?HbA1C: ?No results for input(s): HGBA1C in the last 72 hours. ?CBG: ?No results for input(s): GLUCAP in the last 168 hours. ?Lipid Profile: ?No results for input(s): CHOL, HDL, LDLCALC, TRIG, CHOLHDL, LDLDIRECT in the last 72 hours. ?Thyroid Function Tests: ?No results for input(s): TSH, T4TOTAL, FREET4, T3FREE, THYROIDAB in the last 72 hours. ?Anemia Panel: ?No results for input(s): VITAMINB12, FOLATE, FERRITIN, TIBC, IRON, RETICCTPCT in the last 72 hours. ?Sepsis Labs: ?No results for input(s): PROCALCITON, LATICACIDVEN in the last 168 hours. ? ?No results found for this or any previous visit (from the past 240 hour(s)).  ? ? ?Radiology Studies: ?No results found. ? ?Scheduled Meds: ? aspirin  81 mg Oral Daily  ? atorvastatin  20 mg  Oral Daily  ? benztropine  0.5 mg Oral BID  ? clonazePAM  1 mg Oral TID  ? cloZAPine  375 mg Oral QHS  ? gabapentin  300 mg Oral TID  ? haloperidol  5 mg Oral TID  ? metoprolol succinate  25 mg Oral Dail

## 2022-03-07 ENCOUNTER — Inpatient Hospital Stay: Payer: Medicare HMO

## 2022-03-07 DIAGNOSIS — F209 Schizophrenia, unspecified: Secondary | ICD-10-CM | POA: Diagnosis not present

## 2022-03-07 LAB — CBC WITH DIFFERENTIAL/PLATELET
Abs Immature Granulocytes: 0.07 10*3/uL (ref 0.00–0.07)
Basophils Absolute: 0.1 10*3/uL (ref 0.0–0.1)
Basophils Relative: 1 %
Eosinophils Absolute: 0.5 10*3/uL (ref 0.0–0.5)
Eosinophils Relative: 5 %
HCT: 38.2 % — ABNORMAL LOW (ref 39.0–52.0)
Hemoglobin: 13.2 g/dL (ref 13.0–17.0)
Immature Granulocytes: 1 %
Lymphocytes Relative: 11 %
Lymphs Abs: 1.2 10*3/uL (ref 0.7–4.0)
MCH: 31.2 pg (ref 26.0–34.0)
MCHC: 34.6 g/dL (ref 30.0–36.0)
MCV: 90.3 fL (ref 80.0–100.0)
Monocytes Absolute: 1.2 10*3/uL — ABNORMAL HIGH (ref 0.1–1.0)
Monocytes Relative: 11 %
Neutro Abs: 7.9 10*3/uL — ABNORMAL HIGH (ref 1.7–7.7)
Neutrophils Relative %: 71 %
Platelets: 217 10*3/uL (ref 150–400)
RBC: 4.23 MIL/uL (ref 4.22–5.81)
RDW: 13.1 % (ref 11.5–15.5)
WBC: 10.9 10*3/uL — ABNORMAL HIGH (ref 4.0–10.5)
nRBC: 0 % (ref 0.0–0.2)

## 2022-03-07 MED ORDER — CLOZAPINE 100 MG PO TABS
400.0000 mg | ORAL_TABLET | Freq: Every day | ORAL | Status: DC
Start: 2022-03-07 — End: 2022-03-07

## 2022-03-07 MED ORDER — CLOZAPINE 25 MG PO TABS
375.0000 mg | ORAL_TABLET | Freq: Every day | ORAL | Status: DC
  Administered 2022-03-09 – 2022-03-11 (×3): 375 mg via ORAL
  Filled 2022-03-07 (×5): qty 3

## 2022-03-07 NOTE — Progress Notes (Signed)
Patient fell. Assisted down to left knee by sitter, as he tripped on one shoe that was not completely on his heel.  I did not witness fall, sitter relayed the events. Post fall assessment done as much as he allow me. ? ?MD notified ?Safety Zone completed ?Post Fall Huddle completed ? ?

## 2022-03-07 NOTE — Progress Notes (Signed)
Night shift reported patient had voided on himself in the bed and refused to be changed/cleaned up even after multiple attempts. Discussed plan with NT this am on bathing patient when he is willing to do so. ?

## 2022-03-07 NOTE — Progress Notes (Signed)
Patient's heart rate noted to be elevated at beginning of shift and was a yellow MEWS. As I was walking into patient's room to recheck vital signs, patient was laying flat on bed and coughing. Sounded and appeared to be coughing/choking on secretions. Patient was immediately turn onto his side, and had excessive amount of secretions come out.  ?On vital sign recheck, heart rate has increased but other wise no other changes from baseline. Bishop Limbo, NP notified with no new orders. Will continue to monitor patient.  ? 03/06/22 2331  ?Assess: MEWS Score  ?Temp 98.3 ?F (36.8 ?C)  ?BP  ?(patient refused)  ?Pulse Rate (!) 122  ?Resp 20  ?SpO2 94 %  ?O2 Device Room Air  ?Assess: MEWS Score  ?MEWS Temp 0  ?MEWS Systolic 0  ?MEWS Pulse 2  ?MEWS RR 0  ?MEWS LOC 0  ?MEWS Score 2  ?MEWS Score Color Yellow  ?Assess: if the MEWS score is Yellow or Red  ?Were vital signs taken at a resting state? Yes  ?Focused Assessment Change from prior assessment (see assessment flowsheet)  ?Does the patient meet 2 or more of the SIRS criteria? No  ?Does the patient have a confirmed or suspected source of infection? No  ?MEWS guidelines implemented *See Row Information* Yes  ?Treat  ?MEWS Interventions Other (Comment) ?(provider notified)  ?Pain Score Asleep  ?Take Vital Signs  ?Increase Vital Sign Frequency  Yellow: Q 2hr X 2 then Q 4hr X 2, if remains yellow, continue Q 4hrs  ?Escalate  ?MEWS: Escalate Yellow: discuss with charge nurse/RN and consider discussing with provider and RRT  ?Notify: Charge Nurse/RN  ?Name of Charge Nurse/RN Notified [place], RN  ?Date Charge Nurse/RN Notified 03/06/22  ?Time Charge Nurse/RN Notified 2332  ?Notify: Provider  ?Provider Name/Title Bishop Limbo, NP  ?Date Provider Notified 03/06/22  ?Time Provider Notified 2338  ?Notification Type Page ?(secure chat)  ?Notification Reason Change in status  ?Provider response No new orders  ?Date of Provider Response 03/06/22  ?Time of Provider Response 2340  ?Document   ?Patient Outcome Other (Comment)  ?Progress note created (see row info) Yes  ?Assess: SIRS CRITERIA  ?SIRS Temperature  0  ?SIRS Pulse 1  ?SIRS Respirations  0  ?SIRS WBC 0  ?SIRS Score Sum  1  ? ? ?

## 2022-03-07 NOTE — Progress Notes (Signed)
?PROGRESS NOTE ? ? ? ?Adam [name]  [id number] DOB: August 03, 1961 DOA: 07/20/2021 ?PCP: Pcp, No  ? ?Brief Narrative:  ?61 yo WM with hx of schizophrenia and TBI, who was initially admitted to [facility] ER on July 20, 2021. He spent more than 123 days in the [facility] ED awaiting psychiatric placement.  He was initially brought to [facility] ER due to violent outbursts at his group home.  He was IVC'd initially.  He was noted to be lethargic 12/19, found to have NSTEMI.  Pt was admitted to hospitalist service.  Cardiology consulted who recommended no intervention.   ? After admission to the medical service patient became severely agitated on the floor.  Pt required to ICU night of 12/24 for precedex gtt due to severe agitation and combative behavior.  PCCM was consulted.  Precedex felt to be ineffective and was turned off.  Pt was transferred back to floor on 1/4.  Hospital course remarkable for agitation, violent outbursts. Psychiatry was consulted,making some medication changes.  They do not accept pt to inpatient psych here and recommend continued search for longer term placement.  Senior management involved.  Plan for placement on a group home. ? Patient has been here 200 +  days so  far.  He is difficult to place. ? ?4/5: After staying stable for many days, last night patient again becoming agitated, voiding on himself and not letting any staff to come close to clean.  There was significant urine smell in the room.  There was also some nursing concern that he was choking last night.  Chest x-ray and UA ordered. ?Patient needs to be evaluated by psych and is appropriate for behavioral health admission instead of staying on medical floor.  He has shown this type of behavior many time during current prolonged hospitalization requiring psych intervention. ? ?Assessment & Plan: ?  ?Principal Problem: ?  Schizophrenia, chronic condition with acute exacerbation ([place]) ?Active Problems: ?  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  DNR (do not resuscitate)/DNI(Do Not Intubate) ?  NSTEMI (non-ST elevated myocardial infarction) ([place]) ?  Acute metabolic encephalopathy ?  TBI (traumatic brain injury) ([place]) ?  Stroke [facility]) ?  Elevated CK ?  Constipation ?  Aspiration pneumonia ([place]) ?  Fall ? ?Change in behavior.  Overnight patient was voiding on himself and not letting anyone come closer to clean.  There was also nursing concern of choking spell overnight. ?Patient has had this type of behavior before. ?Another message sent to psych for evaluation. ?-Check UA and do chest x-ray to rule out any UTI or aspiration. ? ?Schizophrenia: chronic condition with acute exacerbation. Psychiatry followed periodically here due to ongoing safety issues related to patient's behavioral disturbances. Continue on ativan as per psych.  Psychiatry reviewed recent clinical status, increasing clozapine to 375, considering increaase to 400 next week ?  ?Fall: on 3/24, noted to have fall w/o LOC while ambulating with staff. Noted to have fallen on L knee. Pt reportedly had no subsequent knee pain and was able to ambulate without difficulty ?  ?Aspiration pneumonia: completed abx course. High risk for recurrent aspiration  ?  ?Constipation: continue on milk of mg as needed. ?  ?Elevated CK: likely secondary to IM injections as per psych.  No CKs checked since 2/20 when it was 1629.  Reviewed CMP and CK was 77. ?  ?Hx of CVA: significant aphasia. Continue on aspirin, statin  ?  ?TBI: chronic  ?  ?Acute metabolic encephalopathy: at  baseline. Resolved  ?  ?NSTEMI: completed IV heparin x 48hrs. Continue on aspirin, statin, metoprolol. Invasive evaluation deferred as per cardio  ?  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 Becoming very agitated and aggressive at times.  Ativan prn. Pursuing placement which is complicated. TOC and leadership involved in challenging disposition ?  ?Acute pulmonary edema: resolved as  of 01/31/2022 ?  ?  ?DVT prophylaxis: SCD/Compression stockings  ?Code Status: dnr ? ? ?  ?Code Status Orders  ?(From admission, onward)  ?  ? ? ?  ? ?  Start     Ordered  ? 11/20/21 2032  Do not attempt resuscitation (DNR)  Continuous       ?Question Answer Comment  ?In the event of cardiac or respiratory ARREST Do not call a ?code blue?   ?In the event of cardiac or respiratory ARREST Do not perform Intubation, CPR, defibrillation or ACLS   ?In the event of cardiac or respiratory ARREST Use medication by any route, position, wound care, and other measures to relive pain and suffering. May use oxygen, suction and manual treatment of airway obstruction as needed for comfort.   ?Comments EDP verified DNR/DNI status with pt's mother   ?  ? 11/20/21 2031  ? ?  ?  ? ?  ? ?Code Status History   ? ? Date Active Date Inactive Code Status Order ID Comments User Context  ? 07/20/2021 2026 11/20/2021 2032 Full Code [id number]  Vladimir Crofts, MD ED  ? ?  ? ?Family Communication: none today  ?Disposition Plan:   no safe d/c plan ?Consults called: None ?Admission status: Inpatient ? ? ?Consultants:  ?psychiatry ? ?Procedures:  ?DG Chest 2 View ? ?Result Date: 02/18/2022 ?CLINICAL DATA:  Fever and cough. EXAM: CHEST - 2 VIEW COMPARISON:  Chest x-ray dated November 20, 2021. FINDINGS: Unchanged cardiomegaly. Improved pulmonary vascular congestion compared to the prior study. New patchy asymmetric opacities in the right lower lobe with trace right pleural effusion. No pneumothorax. No acute osseous abnormality. IMPRESSION: 1. Right lower lobe pneumonia. Electronically Signed   By: Titus Dubin M.D.   On: 02/18/2022 10:29  ? ?DG Abd 1 View ? ?Result Date: 02/19/2022 ?CLINICAL DATA:  Fever EXAM: ABDOMEN - 1 VIEW COMPARISON:  None. FINDINGS: The bowel gas pattern is normal. No radio-opaque calculi or other significant radiographic abnormality are seen. IMPRESSION: Negative. Electronically Signed   By: Davina Poke D.O.   On:  02/19/2022 15:10  ? ?DG Chest Port 1 View ? ?Result Date: 03/07/2022 ?CLINICAL DATA:  Tachycardia. EXAM: PORTABLE CHEST 1 VIEW COMPARISON:  Chest x-ray dated February 18, 2022. FINDINGS: The patient is rotated to the right, accentuating the upper mediastinal contours. Stable cardiomegaly. Chronic mild pulmonary vascular congestion. Resolved right lower lobe pneumonia. No focal consolidation, pleural effusion, or pneumothorax. No acute osseous abnormality. IMPRESSION: 1. No active disease. Resolved right lower lobe pneumonia. Electronically Signed   By: Titus Dubin M.D.   On: 03/07/2022 08:03   ? ? ?Subjective: ?Patient was becoming more agitated and not letting anyone coming close.  Refusing vital check and physical exam.  There was a significant urine smell in the room. ? ?Objective: ?Vitals:  ? 03/07/22 0440 03/07/22 0559 03/07/22 0631 03/07/22 1112  ?BP: (!) 89/60 (!) 89/57 103/68 (!) 151/141  ?Pulse: (!) 121 (!) 116 (!) 116 (!) 55  ?Resp: 20 (!) 26 (!) 21 17  ?Temp:   98.4 ?F (36.9 ?C) 98.8 ?F (37.1 ?C)  ?TempSrc:  Oral Oral  ?SpO2: 95% 96% 91% (!) 86%  ?Weight:      ?Height:      ? ?No intake or output data in the 24 hours ending 03/07/22 1440 ? ?Filed Weights  ? 07/20/21 1635 12/07/21 2025 02/16/22 0229  ?Weight: 79.4 kg 80 kg 85.8 kg  ? ? ?Examination: ? ?Patient refusing any type of physical exam. ? ?Data Reviewed: I have personally reviewed following labs and imaging studies ? ?CBC: ?Recent Labs  ?Lab 02/28/22 ?2144 03/01/22 ?1328  ?WBC 7.3 8.0  ?NEUTROABS 4.8 6.3  ?HGB 12.8* 13.4  ?HCT 38.4* 40.3  ?MCV 91.2 91.6  ?PLT 343 359  ? ? ?Basic Metabolic Panel: ?Recent Labs  ?Lab 03/03/22 ?1125  ?NA 141  ?K 4.0  ?CL 106  ?CO2 28  ?GLUCOSE 109*  ?BUN 29*  ?CREATININE 0.95  ?CALCIUM 9.9  ? ? ?GFR: ?Estimated Creatinine Clearance: 86 mL/min (by C-G formula based on SCr of 0.95 mg/dL). ?Liver Function Tests: ?Recent Labs  ?Lab 03/03/22 ?1125  ?AST 14*  ?ALT 33  ?ALKPHOS 112  ?BILITOT 0.5  ?PROT 7.4  ?ALBUMIN 3.7   ? ? ?No results for input(s): LIPASE, AMYLASE in the last 168 hours. ?No results for input(s): AMMONIA in the last 168 hours. ?Coagulation Profile: ?No results for input(s): INR, PROTIME in the last 168 hours. ?Cardi

## 2022-03-08 DIAGNOSIS — F209 Schizophrenia, unspecified: Secondary | ICD-10-CM | POA: Diagnosis not present

## 2022-03-08 LAB — URINALYSIS, COMPLETE (UACMP) WITH MICROSCOPIC
Bilirubin Urine: NEGATIVE
Glucose, UA: NEGATIVE mg/dL
Ketones, ur: NEGATIVE mg/dL
Nitrite: NEGATIVE
Protein, ur: 100 mg/dL — AB
Specific Gravity, Urine: 1.025 (ref 1.005–1.030)
WBC, UA: >50 WBC/hpf — ABNORMAL HIGH (ref 0–5)
pH: 5 (ref 5.0–8.0)

## 2022-03-08 MED ORDER — SULFAMETHOXAZOLE-TRIMETHOPRIM 800-160 MG PO TABS
1.0000 | ORAL_TABLET | Freq: Two times a day (BID) | ORAL | Status: AC
  Administered 2022-03-08 – 2022-03-14 (×12): 1 via ORAL
  Filled 2022-03-08 (×14): qty 1

## 2022-03-08 NOTE — Plan of Care (Signed)
Tried repeatedly to give pt his night meds particularly the Bactrim since he's being treated for a UTI.  Brandi, RN also attempted to give him the Bactrim.  Pt wouldn't wake up and then when we persisted he literally covered his mouth and kept yelling "No".  Notified the NP on call tonight since these are considered Red Meds.  ?

## 2022-03-08 NOTE — Progress Notes (Signed)
?PROGRESS NOTE ? ? ? ?Adam [name]  AJO:[id number] DOB: 16-Jul-1961 DOA: 07/20/2021 ?PCP: Pcp, No  ? ?Brief Narrative:  ?62 yo WM with hx of schizophrenia and TBI, who was initially admitted to [facility] ER on July 20, 2021. He spent more than 123 days in the [facility] ED awaiting psychiatric placement.  He was initially brought to [facility] ER due to violent outbursts at his group home.  He was IVC'd initially.  He was noted to be lethargic 12/19, found to have NSTEMI.  Pt was admitted to hospitalist service.  Cardiology consulted who recommended no intervention.   ? After admission to the medical service patient became severely agitated on the floor.  Pt required to ICU night of 12/24 for precedex gtt due to severe agitation and combative behavior.  PCCM was consulted.  Precedex felt to be ineffective and was turned off.  Pt was transferred back to floor on 1/4.  Hospital course remarkable for agitation, violent outbursts. Psychiatry was consulted,making some medication changes.  They do not accept pt to inpatient psych here and recommend continued search for longer term placement.  Senior management involved.  Plan for placement on a group home. ? Patient has been here 200 +  days so  far.  He is difficult to place. ? ?4/5: After staying stable for many days, last night patient again becoming agitated, voiding on himself and not letting any staff to come close to clean.  There was significant urine smell in the room.  There was also some nursing concern that he was choking last night.  Chest x-ray and UA ordered. ?Patient needs to be evaluated by psych and is appropriate for behavioral health admission instead of staying on medical floor.  He has shown this type of behavior many time during current prolonged hospitalization requiring psych intervention. ? ?4/6: UA with pyuria, most likely having increased urinary frequency and urgency causing incontinence.  Cultures were sent and he was started on Bactrim.  Remained  afebrile. ? ?Assessment & Plan: ?  ?Principal Problem: ?  Schizophrenia, chronic condition with acute exacerbation ([facility]) ?Active Problems: ?  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  DNR (do not resuscitate)/DNI(Do Not Intubate) ?  NSTEMI (non-ST elevated myocardial infarction) ([facility]) ?  Acute metabolic encephalopathy ?  TBI (traumatic brain injury) ([facility]) ?  Stroke [facility]) ?  Elevated CK ?  Constipation ?  Aspiration pneumonia ([facility]) ?  Fall ? ?UTI.  Most likely having increased frequency and urgency causing incontinence.  Could not tell any symptoms.  Remained afebrile.  UA with pyuria. ?-Urine cultures ordered ?-Starting him on Bactrim for 5 days-we will change antibiotics according to culture and sensitivity if needed. ? ?Change in behavior.  Overnight patient was voiding on himself and not letting anyone come closer to clean.  There was also nursing concern of choking spell overnight. ?Patient has had this type of behavior before. ?Another message sent to psych for evaluation. ?-Check UA and do chest x-ray to rule out any UTI or aspiration. ? ?Schizophrenia: chronic condition with acute exacerbation. Psychiatry followed periodically here due to ongoing safety issues related to patient's behavioral disturbances. Continue on ativan as per psych.  Psychiatry reviewed recent clinical status, increasing clozapine to 375, considering increaase to 400 next week ?  ?Fall: on 3/24, noted to have fall w/o LOC while ambulating with staff. Noted to have fallen on L knee. Pt reportedly had no subsequent knee pain and was able to ambulate without difficulty ?  ?  Aspiration pneumonia: completed abx course. High risk for recurrent aspiration  ?  ?Constipation: continue on milk of mg as needed. ?  ?Elevated CK: likely secondary to IM injections as per psych.  No CKs checked since 2/20 when it was 1629.  Reviewed CMP and CK was 77. ?  ?Hx of CVA: significant aphasia. Continue on aspirin, statin  ?   ?TBI: chronic  ?  ?Acute metabolic encephalopathy: at baseline. Resolved  ?  ?NSTEMI: completed IV heparin x 48hrs. Continue on aspirin, statin, metoprolol. Invasive evaluation deferred as per cardio  ?  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 Becoming very agitated and aggressive at times.  Ativan prn. Pursuing placement which is complicated. TOC and leadership involved in challenging disposition ?  ?Acute pulmonary edema: resolved as of 01/31/2022 ?  ?  ?DVT prophylaxis: SCD/Compression stockings  ?Code Status: dnr ? ? ?  ?Code Status Orders  ?(From admission, onward)  ?  ? ? ?  ? ?  Start     Ordered  ? 11/20/21 2032  Do not attempt resuscitation (DNR)  Continuous       ?Question Answer Comment  ?In the event of cardiac or respiratory ARREST Do not call a ?code blue?   ?In the event of cardiac or respiratory ARREST Do not perform Intubation, CPR, defibrillation or ACLS   ?In the event of cardiac or respiratory ARREST Use medication by any route, position, wound care, and other measures to relive pain and suffering. May use oxygen, suction and manual treatment of airway obstruction as needed for comfort.   ?Comments EDP verified DNR/DNI status with pt's mother   ?  ? 11/20/21 2031  ? ?  ?  ? ?  ? ?Code Status History   ? ? Date Active Date Inactive Code Status Order ID Comments User Context  ? 07/20/2021 2026 11/20/2021 2032 Full Code [id number]  Delton Prairie, MD ED  ? ?  ? ?Family Communication: none today  ?Disposition Plan:   no safe d/c plan ?Consults called: None ?Admission status: Inpatient ? ? ?Consultants:  ?psychiatry ? ?Procedures:  ?DG Chest 2 View ? ?Result Date: 02/18/2022 ?CLINICAL DATA:  Fever and cough. EXAM: CHEST - 2 VIEW COMPARISON:  Chest x-ray dated November 20, 2021. FINDINGS: Unchanged cardiomegaly. Improved pulmonary vascular congestion compared to the prior study. New patchy asymmetric opacities in the right lower lobe with trace right pleural effusion. No  pneumothorax. No acute osseous abnormality. IMPRESSION: 1. Right lower lobe pneumonia. Electronically Signed   By: Obie Dredge M.D.   On: 02/18/2022 10:29  ? ?DG Abd 1 View ? ?Result Date: 02/19/2022 ?CLINICAL DATA:  Fever EXAM: ABDOMEN - 1 VIEW COMPARISON:  None. FINDINGS: The bowel gas pattern is normal. No radio-opaque calculi or other significant radiographic abnormality are seen. IMPRESSION: Negative. Electronically Signed   By: Duanne Guess D.O.   On: 02/19/2022 15:10  ? ?DG Chest Port 1 View ? ?Result Date: 03/07/2022 ?CLINICAL DATA:  Cough. EXAM: PORTABLE CHEST 1 VIEW COMPARISON:  Chest x-ray from today at 0625 hours. FINDINGS: The patient is rotated to the left, limiting evaluation. Stable cardiomegaly. No focal consolidation, pleural effusion, or pneumothorax. No acute osseous abnormality. IMPRESSION: No active disease. Electronically Signed   By: Obie Dredge M.D.   On: 03/07/2022 15:56  ? ?DG Chest Port 1 View ? ?Result Date: 03/07/2022 ?CLINICAL DATA:  Tachycardia. EXAM: PORTABLE CHEST 1 VIEW COMPARISON:  Chest x-ray dated February 18, 2022. FINDINGS: The patient is rotated  to the right, accentuating the upper mediastinal contours. Stable cardiomegaly. Chronic mild pulmonary vascular congestion. Resolved right lower lobe pneumonia. No focal consolidation, pleural effusion, or pneumothorax. No acute osseous abnormality. IMPRESSION: 1. No active disease. Resolved right lower lobe pneumonia. Electronically Signed   By: Obie Dredge M.D.   On: 03/07/2022 08:03   ? ? ?Subjective: ?Patient was sleeping in chair when seen today.  No new complaints. ? ?Objective: ?Vitals:  ? 03/07/22 1622 03/07/22 2053 03/08/22 1057 03/08/22 1255  ?BP: 112/72 93/65 104/74 101/69  ?Pulse: (!) 109 100 (!) 111 96  ?Resp: 17 18 18 18   ?Temp: 98.3 ?F (36.8 ?C) 97.9 ?F (36.6 ?C) 98.9 ?F (37.2 ?C) 98.6 ?F (37 ?C)  ?TempSrc:  Oral    ?SpO2: 100% 96% 97% 96%  ?Weight:      ?Height:      ? ?No intake or output data in the 24 hours  ending 03/08/22 1358 ? ?Filed Weights  ? 07/20/21 1635 12/07/21 2025 02/16/22 0229  ?Weight: 79.4 kg 80 kg 85.8 kg  ? ? ?Examination: ? ?General.     In no acute distress. ?Pulmonary.  Lungs clear bilaterally, normal res

## 2022-03-08 NOTE — Progress Notes (Signed)
?   03/07/22 0559  ?Assess: MEWS Score  ?Temp  ?(pt refused temp)  ?BP (!) 89/57  ?Pulse Rate (!) 116  ?Resp (!) 26  ?SpO2 96 %  ?O2 Device Room Air  ?Assess: MEWS Score  ?MEWS Temp 0  ?MEWS Systolic 1  ?MEWS Pulse 2  ?MEWS RR 2  ?MEWS LOC 0  ?MEWS Score 5  ?MEWS Score Color Red  ?Assess: if the MEWS score is Yellow or Red  ?Were vital signs taken at a resting state? Yes  ?Focused Assessment Change from prior assessment (see assessment flowsheet)  ?Does the patient meet 2 or more of the SIRS criteria? Yes  ?Does the patient have a confirmed or suspected source of infection? No  ?MEWS guidelines implemented *See Row Information* Yes  ?Treat  ?MEWS Interventions Escalated (See documentation below)  ?Pain Score Asleep  ?Take Vital Signs  ?Increase Vital Sign Frequency  Red: Q 1hr X 4 then Q 4hr X 4, if remains red, continue Q 4hrs  ?Escalate  ?MEWS: Escalate Red: discuss with charge nurse/RN and provider, consider discussing with RRT  ?Notify: Charge Nurse/RN  ?Name of Charge Nurse/RN Notified [place], RN  ?Date Charge Nurse/RN Notified 03/06/22  ?Time Charge Nurse/RN Notified [phone]  ?Notify: Provider  ?Provider Name/Title Neomia Glass, NP  ?Date Provider Notified 03/07/22  ?Time Provider Notified [phone]  ?Notification Type Page ?(secure chat)  ?Notification Reason Change in status  ?Provider response At bedside  ?Date of Provider Response 03/07/22  ?Time of Provider Response [phone]  ?Document  ?Patient Outcome Other (Comment)  ?Assess: SIRS CRITERIA  ?SIRS Temperature  0  ?SIRS Pulse 1  ?SIRS Respirations  1  ?SIRS WBC 0  ?SIRS Score Sum  2  ? ? ?

## 2022-03-08 NOTE — Progress Notes (Signed)
?   03/08/22 1057  ?Assess: MEWS Score  ?Temp 98.9 ?F (37.2 ?C)  ?BP 104/74  ?Pulse Rate (!) 111  ?Resp 18  ?Level of Consciousness Alert  ?SpO2 97 %  ?O2 Device Room Air  ?Assess: MEWS Score  ?MEWS Temp 0  ?MEWS Systolic 0  ?MEWS Pulse 2  ?MEWS RR 0  ?MEWS LOC 0  ?MEWS Score 2  ?MEWS Score Color Yellow  ?Assess: if the MEWS score is Yellow or Red  ?Were vital signs taken at a resting state? Yes  ?Focused Assessment Change from prior assessment (see assessment flowsheet)  ?Does the patient meet 2 or more of the SIRS criteria? No  ?Does the patient have a confirmed or suspected source of infection? Yes ?(UTI)  ?Provider and Rapid Response Notified? Yes ?(provider only)  ?MEWS guidelines implemented *See Row Information* Yes  ?Treat  ?MEWS Interventions Administered scheduled meds/treatments  ?Pain Scale PAINAD  ?Pain Score 0  ?Take Vital Signs  ?Increase Vital Sign Frequency  Yellow: Q 2hr X 2 then Q 4hr X 2, if remains yellow, continue Q 4hrs  ?Notify: Charge Nurse/RN  ?Name of Charge Nurse/RN Notified Siri Cole  ?Date Charge Nurse/RN Notified 03/08/22  ?Time Charge Nurse/RN Notified 1108  ?Notify: Provider  ?Provider Name/Title Dr Reesa Chew  ?Date Provider Notified 03/08/22  ?Time Provider Notified 1121  ?Notification Type  ?(text)  ?Notification Reason Change in status  ?Provider response No new orders ?(scheduled metoprolol gven)  ?Date of Provider Response 03/08/22  ?Assess: SIRS CRITERIA  ?SIRS Temperature  0  ?SIRS Pulse 1  ?SIRS Respirations  0  ?SIRS WBC 0  ?SIRS Score Sum  1  ? ? ?

## 2022-03-08 NOTE — Consult Note (Signed)
[facility] Face-to-Face Psychiatry Consult  ? ?Reason for Consult: Follow-up consult for 61 year old man with a history of traumatic brain injury chronic behavioral and cognitive severe impairment reported past diagnosis of schizophrenia.  Continues to have intermittent problems with behavior. ?Referring Physician: Nelson Chimes ?Patient Identification: Adam [name] ?MRN:  [id number] ?Principal Diagnosis: Schizophrenia, chronic condition with acute exacerbation (HCC) ?Diagnosis:  Principal Problem: ?  Schizophrenia, chronic condition with acute exacerbation (HCC) ?Active Problems: ?  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  DNR (do not resuscitate)/DNI(Do Not Intubate) ?  NSTEMI (non-ST elevated myocardial infarction) (HCC) ?  Acute metabolic encephalopathy ?  TBI (traumatic brain injury) (HCC) ?  Stroke [facility]) ?  Elevated CK ?  Constipation ?  Aspiration pneumonia (HCC) ?  Fall ? ? ?Total Time spent with patient: 30 minutes ? ?Subjective:   ?Adam [name] is a 61 y.o. male patient admitted with patient today and yesterday when I came by was sound asleep and would not wake up to my saying his name multiple times and was not able to give any information. ? ?HPI: See previous notes.  61 year old man with chronic severe neurologic impairment who has been in the hospital since last summer.  He had been awaiting placement for several months when he had a episode of cardiac ischemia and was admitted to the medical service last December.  He has been on the medical service awaiting placement since then.  Patient has continued to show chronic episodes of difficult behavior which have required lots of medication and one-to-one management on the medical service.  I have been following intermittently and most recently had recommended that it was worth a trial of starting clozapine to see if it made a difference with his symptoms.  It was reported to me that the patient had had a worsening of his mental state in the  last few days.  It was mentioned that the patient had urinated uncontrollably on himself more than once.  I came to see the patient yesterday evening and today on both occasions he was sound asleep or unconscious and could not be awakened with saying his name repeatedly.  Staff on both occasions told me that he has been subdued compared to his baseline.  He has been sleeping large chunks of the day.  Has not been eating as well as usual.  Has seemed more sick and run down.  They repeated the concern about his uncontrolled urination.  There had not been any medication changes recently.  It had been many days since the last change in the clozapine dose.  Work-up so far has included a urinalysis which appears to show signs of infection. ? ?Past Psychiatric History: Patient has a long history of which we have only a partial understanding.  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  Came to our hospital after recently leaving his previous living facility.  Multiple medications mood stabilizers and antipsychotics have been tried with minimal effect on his overall behavior ? ?Risk to Self:   ?Risk to Others:   ?Prior Inpatient Therapy:   ?Prior Outpatient Therapy:   ? ?Past Medical History:  ?Past Medical History:  ?Diagnosis Date  ? Myocardial infarction [facility])   ? Schizophrenia (HCC)   ? Stroke [facility])   ? TBI (traumatic brain injury)   ? History reviewed. No pertinent surgical history. ?Family History: History reviewed. No pertinent family history. ?Family Psychiatric  History: None are known ?  Social History:  ?Social History  ? ?Substance and Sexual Activity  ?Alcohol Use Not Currently  ?   ?Social History  ? ?Substance and Sexual Activity  ?Drug Use Not Currently  ?  ?Social History  ? ?Socioeconomic History  ? Marital status: Single  ?  Spouse name: Not on file  ? Number of children: Not on file  ? Years of education: Not on file  ?  Highest education level: Not on file  ?Occupational History  ? Not on file  ?Tobacco Use  ? Smoking status: Never  ?  Passive exposure: Never  ? Smokeless tobacco: Never  ?Vaping Use  ? Vaping Use: Unknown  ?Substance and Sexual Activity  ? Alcohol use: Not Currently  ? Drug use: Not Currently  ? Sexual activity: Not Currently  ?Other Topics Concern  ? Not on file  ?Social History Narrative  ? Not on file  ? ?Social Determinants of Health  ? ?Financial Resource Strain: Not on file  ?Food Insecurity: Not on file  ?Transportation Needs: Not on file  ?Physical Activity: Not on file  ?Stress: Not on file  ?Social Connections: Not on file  ? ?Additional Social History: ?  ? ?Allergies:   ?Allergies  ?Allergen Reactions  ? Morphine And Related   ? Nsaids   ? ? ?Labs:  ?Results for orders placed or performed during the hospital encounter of 07/20/21 (from the past 48 hour(s))  ?CBC with Differential/Platelet     Status: Abnormal  ? Collection Time: 03/07/22  2:52 PM  ?Result Value Ref Range  ? WBC 10.9 (H) 4.0 - 10.5 K/uL  ? RBC 4.23 4.22 - 5.81 MIL/uL  ? Hemoglobin 13.2 13.0 - 17.0 g/dL  ? HCT 38.2 (L) 39.0 - 52.0 %  ? MCV 90.3 80.0 - 100.0 fL  ? MCH 31.2 26.0 - 34.0 pg  ? MCHC 34.6 30.0 - 36.0 g/dL  ? RDW 13.1 11.5 - 15.5 %  ? Platelets 217 150 - 400 K/uL  ? nRBC 0.0 0.0 - 0.2 %  ? Neutrophils Relative % 71 %  ? Neutro Abs 7.9 (H) 1.7 - 7.7 K/uL  ? Lymphocytes Relative 11 %  ? Lymphs Abs 1.2 0.7 - 4.0 K/uL  ? Monocytes Relative 11 %  ? Monocytes Absolute 1.2 (H) 0.1 - 1.0 K/uL  ? Eosinophils Relative 5 %  ? Eosinophils Absolute 0.5 0.0 - 0.5 K/uL  ? Basophils Relative 1 %  ? Basophils Absolute 0.1 0.0 - 0.1 K/uL  ? Immature Granulocytes 1 %  ? Abs Immature Granulocytes 0.07 0.00 - 0.07 K/uL  ?  Comment: Performed at [facility], [address], [place], [place] [address]  ?Urinalysis, Complete w Microscopic Urine, Random     Status: Abnormal  ? Collection Time: 03/08/22 12:48 AM  ?Result Value Ref Range  ?  Color, Urine AMBER (A) YELLOW  ?  Comment: BIOCHEMICALS MAY BE AFFECTED BY COLOR  ? APPearance CLOUDY (A) CLEAR  ? Specific Gravity, Urine 1.025 1.005 - 1.030  ? pH 5.0 5.0 - 8.0  ? Glucose, UA NEGATIVE NEGATIVE mg/dL  ? Hgb urine dipstick SMALL (A) NEGATIVE  ? Bilirubin Urine NEGATIVE NEGATIVE  ? Ketones, ur NEGATIVE NEGATIVE mg/dL  ? Protein, ur 100 (A) NEGATIVE mg/dL  ? Nitrite NEGATIVE NEGATIVE  ? Leukocytes,Ua LARGE (A) NEGATIVE  ? RBC / HPF 6-10 0 - 5 RBC/hpf  ? WBC, UA >50 (H) 0 - 5 WBC/hpf  ? Bacteria, UA RARE (A) NONE  SEEN  ? Squamous Epithelial / LPF 0-5 0 - 5  ? WBC Clumps PRESENT   ? Mucus PRESENT   ?  Comment: Performed at [facility], [address], [place], [place] [address]  ? ? ?Current Facility-Administered Medications  ?Medication Dose Route Frequency Provider Last Rate Last Admin  ? acetaminophen (TYLENOL) tablet 650 mg  650 mg Oral Q4H PRN Carollee Herter, DO   650 mg at 02/26/22 1017  ? alum & mag hydroxide-simeth (MAALOX/MYLANTA) 200-200-20 MG/5ML suspension 30 mL  30 mL Oral Q6H PRN Lurene Shadow, MD   30 mL at 02/19/22 0830  ? aspirin chewable tablet 81 mg  81 mg Oral Daily Lolita Patella B, MD   81 mg at 03/08/22 1106  ? atorvastatin (LIPITOR) tablet 20 mg  20 mg Oral Daily Enedina Finner, MD   20 mg at 03/08/22 1107  ? benztropine (COGENTIN) tablet 0.5 mg  0.5 mg Oral BID Carollee Herter, DO   0.5 mg at 03/08/22 1107  ? bisacodyl (DULCOLAX) EC tablet 10 mg  10 mg Oral Daily PRN Gillis Santa, MD   10 mg at 03/04/22 1017  ? clonazePAM (KLONOPIN) tablet 1 mg  1 mg Oral TID He, [name], MD   1 mg at 03/08/22 1106  ? cloZAPine (CLOZARIL) tablet 375 mg  375 mg Oral QHS Quinnie Barcelo T, MD      ? diphenhydrAMINE (BENADRYL) capsule 50 mg  50 mg Oral Q4H PRN He, [name], MD   50 mg at 02/22/22 2016  ? Or  ? diphenhydrAMINE (BENADRYL) injection 50 mg  50 mg Intramuscular Q4H PRN He, [name], MD   50 mg at 02/27/22 1727  ? gabapentin (NEURONTIN) tablet 300 mg  300 mg Oral TID Martyn Malay, RPH   300 mg  at 03/08/22 1111  ? guaiFENesin-dextromethorphan (ROBITUSSIN DM) 100-10 MG/5ML syrup 5 mL  5 mL Oral Q4H PRN Delfino Lovett, MD   5 mL at 02/18/22 4076  ? haloperidol (HALDOL) tablet 5 mg  5 mg Oral TID [place]

## 2022-03-09 DIAGNOSIS — N390 Urinary tract infection, site not specified: Secondary | ICD-10-CM

## 2022-03-09 DIAGNOSIS — F209 Schizophrenia, unspecified: Secondary | ICD-10-CM | POA: Diagnosis not present

## 2022-03-09 LAB — URINE CULTURE

## 2022-03-09 NOTE — Progress Notes (Signed)
?PROGRESS NOTE ? ? ? ?Adam [name]  GTX:[id number] DOB: 07/29/1961 DOA: 07/20/2021 ?PCP: Pcp, No  ? ?Brief Narrative:  ?61 yo WM with hx of schizophrenia and TBI, who was initially admitted to [facility] ER on July 20, 2021. He spent more than 123 days in the [facility] ED awaiting psychiatric placement.  He was initially brought to [facility] ER due to violent outbursts at his group home.  He was IVC'd initially.  He was noted to be lethargic 12/19, found to have NSTEMI.  Pt was admitted to hospitalist service.  Cardiology consulted who recommended no intervention.   ? After admission to the medical service patient became severely agitated on the floor.  Pt required to ICU night of 12/24 for precedex gtt due to severe agitation and combative behavior.  PCCM was consulted.  Precedex felt to be ineffective and was turned off.  Pt was transferred back to floor on 1/4.  Hospital course remarkable for agitation, violent outbursts. Psychiatry was consulted,making some medication changes.  They do not accept pt to inpatient psych here and recommend continued search for longer term placement.  Senior management involved.  Plan for placement on a group home. ? Patient has been here 200 +  days so  far.  He is difficult to place. ? ?4/5: After staying stable for many days, last night patient again becoming agitated, voiding on himself and not letting any staff to come close to clean.  There was significant urine smell in the room.  There was also some nursing concern that he was choking last night.  Chest x-ray and UA ordered. ?Patient needs to be evaluated by psych and is appropriate for behavioral health admission instead of staying on medical floor.  He has shown this type of behavior many time during current prolonged hospitalization requiring psych intervention. ? ?4/6: UA with pyuria, most likely having increased urinary frequency and urgency causing incontinence.  Cultures were sent and he was started on Bactrim.  Remained  afebrile. ? ?4/7: Urine culture with multiple species.  Patient is already on Bactrim and we will complete a 5-day course as clinically seems improving.  Urinary urgency resolved. ? ?Assessment & Plan: ?  ?Principal Problem: ?  Schizophrenia, chronic condition with acute exacerbation (HCC) ?Active Problems: ?  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  DNR (do not resuscitate)/DNI(Do Not Intubate) ?  NSTEMI (non-ST elevated myocardial infarction) (HCC) ?  Acute metabolic encephalopathy ?  TBI (traumatic brain injury) (HCC) ?  Stroke [facility]) ?  Elevated CK ?  Constipation ?  Aspiration pneumonia (HCC) ?  Fall ?  UTI (urinary tract infection) ? ?UTI.  Most likely having increased frequency and urgency causing incontinence.  Could not tell any symptoms.  Remained afebrile.  UA with pyuria. ?-Urine cultures with multiple species ?-Continue Bactrim and complete a 5-day course ? ?Change in behavior.  Overnight patient was voiding on himself and not letting anyone come closer to clean.  There was also nursing concern of choking spell overnight. ?Patient has had this type of behavior before. ?Another message sent to psych for evaluation. ?-Check UA and do chest x-ray to rule out any UTI or aspiration. ? ?Schizophrenia: chronic condition with acute exacerbation. Psychiatry followed periodically here due to ongoing safety issues related to patient's behavioral disturbances. Continue on ativan as per psych.  Psychiatry reviewed recent clinical status, increasing clozapine to 375, considering increaase to 400 next week ?  ?Fall: on 3/24, noted to have fall w/o LOC while  ambulating with staff. Noted to have fallen on L knee. Pt reportedly had no subsequent knee pain and was able to ambulate without difficulty ?  ?Aspiration pneumonia: completed abx course. High risk for recurrent aspiration  ?  ?Constipation: continue on milk of mg as needed. ?  ?Elevated CK: likely secondary to IM injections as per  psych.  No CKs checked since 2/20 when it was 1629.  Reviewed CMP and CK was 77. ?  ?Hx of CVA: significant aphasia. Continue on aspirin, statin  ?  ?TBI: chronic  ?  ?Acute metabolic encephalopathy: at baseline. Resolved  ?  ?NSTEMI: completed IV heparin x 48hrs. Continue on aspirin, statin, metoprolol. Invasive evaluation deferred as per cardio  ?  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 Becoming very agitated and aggressive at times.  Ativan prn. Pursuing placement which is complicated. TOC and leadership involved in challenging disposition ?  ?Acute pulmonary edema: resolved as of 01/31/2022 ?  ?  ?DVT prophylaxis: SCD/Compression stockings  ?Code Status: dnr ? ? ?  ?Code Status Orders  ?(From admission, onward)  ?  ? ? ?  ? ?  Start     Ordered  ? 11/20/21 2032  Do not attempt resuscitation (DNR)  Continuous       ?Question Answer Comment  ?In the event of cardiac or respiratory ARREST Do not call a ?code blue?   ?In the event of cardiac or respiratory ARREST Do not perform Intubation, CPR, defibrillation or ACLS   ?In the event of cardiac or respiratory ARREST Use medication by any route, position, wound care, and other measures to relive pain and suffering. May use oxygen, suction and manual treatment of airway obstruction as needed for comfort.   ?Comments EDP verified DNR/DNI status with pt's mother   ?  ? 11/20/21 2031  ? ?  ?  ? ?  ? ?Code Status History   ? ? Date Active Date Inactive Code Status Order ID Comments User Context  ? 07/20/2021 2026 11/20/2021 2032 Full Code [id number]  Delton Prairie, MD ED  ? ?  ? ?Family Communication: none today  ?Disposition Plan:   no safe d/c plan ?Consults called: None ?Admission status: Inpatient ? ? ?Consultants:  ?psychiatry ? ?Procedures:  ?DG Chest 2 View ? ?Result Date: 02/18/2022 ?CLINICAL DATA:  Fever and cough. EXAM: CHEST - 2 VIEW COMPARISON:  Chest x-ray dated November 20, 2021. FINDINGS: Unchanged cardiomegaly. Improved  pulmonary vascular congestion compared to the prior study. New patchy asymmetric opacities in the right lower lobe with trace right pleural effusion. No pneumothorax. No acute osseous abnormality. IMPRESSION: 1. Right lower lobe pneumonia. Electronically Signed   By: Obie Dredge M.D.   On: 02/18/2022 10:29  ? ?DG Abd 1 View ? ?Result Date: 02/19/2022 ?CLINICAL DATA:  Fever EXAM: ABDOMEN - 1 VIEW COMPARISON:  None. FINDINGS: The bowel gas pattern is normal. No radio-opaque calculi or other significant radiographic abnormality are seen. IMPRESSION: Negative. Electronically Signed   By: Duanne Guess D.O.   On: 02/19/2022 15:10  ? ?DG Chest Port 1 View ? ?Result Date: 03/07/2022 ?CLINICAL DATA:  Cough. EXAM: PORTABLE CHEST 1 VIEW COMPARISON:  Chest x-ray from today at 0625 hours. FINDINGS: The patient is rotated to the left, limiting evaluation. Stable cardiomegaly. No focal consolidation, pleural effusion, or pneumothorax. No acute osseous abnormality. IMPRESSION: No active disease. Electronically Signed   By: Obie Dredge M.D.   On: 03/07/2022 15:56  ? ?DG Chest Port 1 View ? ?  Result Date: 03/07/2022 ?CLINICAL DATA:  Tachycardia. EXAM: PORTABLE CHEST 1 VIEW COMPARISON:  Chest x-ray dated February 18, 2022. FINDINGS: The patient is rotated to the right, accentuating the upper mediastinal contours. Stable cardiomegaly. Chronic mild pulmonary vascular congestion. Resolved right lower lobe pneumonia. No focal consolidation, pleural effusion, or pneumothorax. No acute osseous abnormality. IMPRESSION: 1. No active disease. Resolved right lower lobe pneumonia. Electronically Signed   By: Obie Dredge M.D.   On: 03/07/2022 08:03   ? ? ?Subjective: ?Patient was seen and examined today.  Relatively little calm and no complaints. ? ?Objective: ?Vitals:  ? 03/08/22 2041 03/08/22 2121 03/09/22 0437 03/09/22 1026  ?BP: 107/78  105/70 124/76  ?Pulse: 88  79 77  ?Resp: 17  16 18   ?Temp: 98.6 ?F (37 ?C) 97.9 ?F (36.6 ?C) 98.5 ?F  (36.9 ?C) 98.3 ?F (36.8 ?C)  ?TempSrc:  Axillary Oral Oral  ?SpO2: 97%  98% 99%  ?Weight:      ?Height:      ? ?No intake or output data in the 24 hours ending 03/09/22 1712 ? ?Filed Weights  ? 07/20/21 1635 12/07/21 202

## 2022-03-10 DIAGNOSIS — F209 Schizophrenia, unspecified: Secondary | ICD-10-CM | POA: Diagnosis not present

## 2022-03-10 NOTE — Progress Notes (Signed)
?PROGRESS NOTE ? ? ? ?Adam [name]  GTX:[id number] DOB: 07/29/1961 DOA: 07/20/2021 ?PCP: Pcp, No  ? ?Brief Narrative:  ?61 yo WM with hx of schizophrenia and TBI, who was initially admitted to [facility] ER on July 20, 2021. He spent more than 123 days in the [facility] ED awaiting psychiatric placement.  He was initially brought to [facility] ER due to violent outbursts at his group home.  He was IVC'd initially.  He was noted to be lethargic 12/19, found to have NSTEMI.  Pt was admitted to hospitalist service.  Cardiology consulted who recommended no intervention.   ? After admission to the medical service patient became severely agitated on the floor.  Pt required to ICU night of 12/24 for precedex gtt due to severe agitation and combative behavior.  PCCM was consulted.  Precedex felt to be ineffective and was turned off.  Pt was transferred back to floor on 1/4.  Hospital course remarkable for agitation, violent outbursts. Psychiatry was consulted,making some medication changes.  They do not accept pt to inpatient psych here and recommend continued search for longer term placement.  Senior management involved.  Plan for placement on a group home. ? Patient has been here 200 +  days so  far.  He is difficult to place. ? ?4/5: After staying stable for many days, last night patient again becoming agitated, voiding on himself and not letting any staff to come close to clean.  There was significant urine smell in the room.  There was also some nursing concern that he was choking last night.  Chest x-ray and UA ordered. ?Patient needs to be evaluated by psych and is appropriate for behavioral health admission instead of staying on medical floor.  He has shown this type of behavior many time during current prolonged hospitalization requiring psych intervention. ? ?4/6: UA with pyuria, most likely having increased urinary frequency and urgency causing incontinence.  Cultures were sent and he was started on Bactrim.  Remained  afebrile. ? ?4/7: Urine culture with multiple species.  Patient is already on Bactrim and we will complete a 5-day course as clinically seems improving.  Urinary urgency resolved. ? ?Assessment & Plan: ?  ?Principal Problem: ?  Schizophrenia, chronic condition with acute exacerbation (HCC) ?Active Problems: ?  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  DNR (do not resuscitate)/DNI(Do Not Intubate) ?  NSTEMI (non-ST elevated myocardial infarction) (HCC) ?  Acute metabolic encephalopathy ?  TBI (traumatic brain injury) (HCC) ?  Stroke [facility]) ?  Elevated CK ?  Constipation ?  Aspiration pneumonia (HCC) ?  Fall ?  UTI (urinary tract infection) ? ?UTI.  Most likely having increased frequency and urgency causing incontinence.  Could not tell any symptoms.  Remained afebrile.  UA with pyuria. ?-Urine cultures with multiple species ?-Continue Bactrim and complete a 5-day course ? ?Change in behavior.  Overnight patient was voiding on himself and not letting anyone come closer to clean.  There was also nursing concern of choking spell overnight. ?Patient has had this type of behavior before. ?Another message sent to psych for evaluation. ?-Check UA and do chest x-ray to rule out any UTI or aspiration. ? ?Schizophrenia: chronic condition with acute exacerbation. Psychiatry followed periodically here due to ongoing safety issues related to patient's behavioral disturbances. Continue on ativan as per psych.  Psychiatry reviewed recent clinical status, increasing clozapine to 375, considering increaase to 400 next week ?  ?Fall: on 3/24, noted to have fall w/o LOC while  ambulating with staff. Noted to have fallen on L knee. Pt reportedly had no subsequent knee pain and was able to ambulate without difficulty ?  ?Aspiration pneumonia: completed abx course. High risk for recurrent aspiration  ?  ?Constipation: continue on milk of mg as needed. ?  ?Elevated CK: likely secondary to IM injections as per  psych.  No CKs checked since 2/20 when it was 1629.  Reviewed CMP and CK was 77. ?  ?Hx of CVA: significant aphasia. Continue on aspirin, statin  ?  ?TBI: chronic  ?  ?Acute metabolic encephalopathy: at baseline. Resolved  ?  ?NSTEMI: completed IV heparin x 48hrs. Continue on aspirin, statin, metoprolol. Invasive evaluation deferred as per cardio  ?  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 Becoming very agitated and aggressive at times.  Ativan prn. Pursuing placement which is complicated. TOC and leadership involved in challenging disposition ?  ?Acute pulmonary edema: resolved as of 01/31/2022 ?  ?  ?DVT prophylaxis: SCD/Compression stockings  ?Code Status: dnr ? ? ?  ?Code Status Orders  ?(From admission, onward)  ?  ? ? ?  ? ?  Start     Ordered  ? 11/20/21 2032  Do not attempt resuscitation (DNR)  Continuous       ?Question Answer Comment  ?In the event of cardiac or respiratory ARREST Do not call a ?code blue?   ?In the event of cardiac or respiratory ARREST Do not perform Intubation, CPR, defibrillation or ACLS   ?In the event of cardiac or respiratory ARREST Use medication by any route, position, wound care, and other measures to relive pain and suffering. May use oxygen, suction and manual treatment of airway obstruction as needed for comfort.   ?Comments EDP verified DNR/DNI status with pt's mother   ?  ? 11/20/21 2031  ? ?  ?  ? ?  ? ?Code Status History   ? ? Date Active Date Inactive Code Status Order ID Comments User Context  ? 07/20/2021 2026 11/20/2021 2032 Full Code [id number]  Delton Prairie, MD ED  ? ?  ? ?Family Communication: none today  ?Disposition Plan:   no safe d/c plan ?Consults called: None ?Admission status: Inpatient ? ? ?Consultants:  ?psychiatry ? ?Procedures:  ?DG Chest 2 View ? ?Result Date: 02/18/2022 ?CLINICAL DATA:  Fever and cough. EXAM: CHEST - 2 VIEW COMPARISON:  Chest x-ray dated November 20, 2021. FINDINGS: Unchanged cardiomegaly. Improved  pulmonary vascular congestion compared to the prior study. New patchy asymmetric opacities in the right lower lobe with trace right pleural effusion. No pneumothorax. No acute osseous abnormality. IMPRESSION: 1. Right lower lobe pneumonia. Electronically Signed   By: Obie Dredge M.D.   On: 02/18/2022 10:29  ? ?DG Abd 1 View ? ?Result Date: 02/19/2022 ?CLINICAL DATA:  Fever EXAM: ABDOMEN - 1 VIEW COMPARISON:  None. FINDINGS: The bowel gas pattern is normal. No radio-opaque calculi or other significant radiographic abnormality are seen. IMPRESSION: Negative. Electronically Signed   By: Duanne Guess D.O.   On: 02/19/2022 15:10  ? ?DG Chest Port 1 View ? ?Result Date: 03/07/2022 ?CLINICAL DATA:  Cough. EXAM: PORTABLE CHEST 1 VIEW COMPARISON:  Chest x-ray from today at 0625 hours. FINDINGS: The patient is rotated to the left, limiting evaluation. Stable cardiomegaly. No focal consolidation, pleural effusion, or pneumothorax. No acute osseous abnormality. IMPRESSION: No active disease. Electronically Signed   By: Obie Dredge M.D.   On: 03/07/2022 15:56  ? ?DG Chest Port 1 View ? ?  Result Date: 03/07/2022 ?CLINICAL DATA:  Tachycardia. EXAM: PORTABLE CHEST 1 VIEW COMPARISON:  Chest x-ray dated February 18, 2022. FINDINGS: The patient is rotated to the right, accentuating the upper mediastinal contours. Stable cardiomegaly. Chronic mild pulmonary vascular congestion. Resolved right lower lobe pneumonia. No focal consolidation, pleural effusion, or pneumothorax. No acute osseous abnormality. IMPRESSION: 1. No active disease. Resolved right lower lobe pneumonia. Electronically Signed   By: Obie Dredge M.D.   On: 03/07/2022 08:03   ? ? ?Subjective: ?Patient was very agitated and shouting when seen today.  He was refusing all the care. ? ?Objective: ?Vitals:  ? 03/08/22 2121 03/09/22 0437 03/09/22 1026 03/09/22 2135  ?BP:  105/70 124/76 136/89  ?Pulse:  79 77 98  ?Resp:  16 18 19   ?Temp: 97.9 ?F (36.6 ?C) 98.5 ?F (36.9  ?C) 98.3 ?F (36.8 ?C) (!) 97.5 ?F (36.4 ?C)  ?TempSrc: Axillary Oral Oral Oral  ?SpO2:  98% 99% 100%  ?Weight:      ?Height:      ? ? ?Intake/Output Summary (Last 24 hours) at 03/10/2022 1508 ?Last data filed at 03/09/2022 2126

## 2022-03-10 NOTE — Progress Notes (Signed)
Pharmacy Consult - Clozapine   ?  ?61 yo WM with hx of schizophrenia and TBI ordered clozapine 375 mg PO HS ?  ?This patient's order has been reviewed for prescribing contraindications.  ?  ?Clozapine REMS enrollment: 01/04/22  ?REMS patient ID: [id number] ?  ?  ?Plan: ?[place] [id number]/uL 03/07/22 ?Monitor ANC at least weekly while inpatient ?

## 2022-03-10 NOTE — Plan of Care (Signed)
  Problem: Clinical Measurements: Goal: Will remain free from infection Outcome: Progressing   Problem: Clinical Measurements: Goal: Diagnostic test results will improve Outcome: Progressing   Problem: Nutrition: Goal: Adequate nutrition will be maintained Outcome: Progressing   

## 2022-03-11 DIAGNOSIS — F209 Schizophrenia, unspecified: Secondary | ICD-10-CM | POA: Diagnosis not present

## 2022-03-11 NOTE — Progress Notes (Signed)
Patient refused to have vitals taken

## 2022-03-11 NOTE — TOC Progression Note (Signed)
Transition of Care (TOC) - Progression Note  ? ? ?Patient Details  ?Name: Adam [name] ?MRN: [id number] ?Date of Birth: 1961-02-02 ? ?Transition of Care (TOC) CM/SW Contact  ?Bing Quarry, RN ?Phone Number: ?03/11/2022, 3:40 PM ? ?Clinical Narrative: 4/9: Per provider progress note of today: See below this line:  ? "4/6: UA with pyuria, most likely having increased urinary frequency and urgency causing incontinence.  Cultures were sent and he was started on Bactrim.  Remained afebrile. ?4/7: Urine culture with multiple species.  Patient is already on Bactrim and we will complete a 5-day course as clinically seems improving. Urinary urgency resolved." ?Gabriel Cirri RN CM 03/11/22 340pm.  ? ? ? ?Expected Discharge Plan: Group Home ?Barriers to Discharge: Continued Medical Work up ? ?Expected Discharge Plan and Services ?Expected Discharge Plan: Group Home ?In-house Referral: Clinical Social Work ?  ?  ?Living arrangements for the past 2 months: Group Home ?                ?  ?  ?  ?  ?  ?  ?  ?  ?  ?  ? ? ?Social Determinants of Health (SDOH) Interventions ?  ? ?Readmission Risk Interventions ?   ? View : No data to display.  ?  ?  ?  ? ? ?

## 2022-03-11 NOTE — Plan of Care (Signed)
  Problem: Clinical Measurements: Goal: Diagnostic test results will improve Outcome: Progressing   Problem: Coping: Goal: Level of anxiety will decrease Outcome: Progressing   Problem: Pain Managment: Goal: General experience of comfort will improve Outcome: Progressing   Problem: Safety: Goal: Ability to remain free from injury will improve Outcome: Progressing   

## 2022-03-11 NOTE — Progress Notes (Signed)
Patient refused his morning medication until 1426 pm. Patient had some of the same medications due at 1600 pm. Spoke with pharmacy and they said the skip next dose and for patient to take evening dose.  ?

## 2022-03-11 NOTE — Progress Notes (Signed)
?PROGRESS NOTE ? ? ? ?Adam [name]  GTX:[id number] DOB: 07/29/1961 DOA: 07/20/2021 ?PCP: Pcp, No  ? ?Brief Narrative:  ?61 yo WM with hx of schizophrenia and TBI, who was initially admitted to [facility] ER on July 20, 2021. He spent more than 123 days in the [facility] ED awaiting psychiatric placement.  He was initially brought to [facility] ER due to violent outbursts at his group home.  He was IVC'd initially.  He was noted to be lethargic 12/19, found to have NSTEMI.  Pt was admitted to hospitalist service.  Cardiology consulted who recommended no intervention.   ? After admission to the medical service patient became severely agitated on the floor.  Pt required to ICU night of 12/24 for precedex gtt due to severe agitation and combative behavior.  PCCM was consulted.  Precedex felt to be ineffective and was turned off.  Pt was transferred back to floor on 1/4.  Hospital course remarkable for agitation, violent outbursts. Psychiatry was consulted,making some medication changes.  They do not accept pt to inpatient psych here and recommend continued search for longer term placement.  Senior management involved.  Plan for placement on a group home. ? Patient has been here 200 +  days so  far.  He is difficult to place. ? ?4/5: After staying stable for many days, last night patient again becoming agitated, voiding on himself and not letting any staff to come close to clean.  There was significant urine smell in the room.  There was also some nursing concern that he was choking last night.  Chest x-ray and UA ordered. ?Patient needs to be evaluated by psych and is appropriate for behavioral health admission instead of staying on medical floor.  He has shown this type of behavior many time during current prolonged hospitalization requiring psych intervention. ? ?4/6: UA with pyuria, most likely having increased urinary frequency and urgency causing incontinence.  Cultures were sent and he was started on Bactrim.  Remained  afebrile. ? ?4/7: Urine culture with multiple species.  Patient is already on Bactrim and we will complete a 5-day course as clinically seems improving.  Urinary urgency resolved. ? ?Assessment & Plan: ?  ?Principal Problem: ?  Schizophrenia, chronic condition with acute exacerbation (HCC) ?Active Problems: ?  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  DNR (do not resuscitate)/DNI(Do Not Intubate) ?  NSTEMI (non-ST elevated myocardial infarction) (HCC) ?  Acute metabolic encephalopathy ?  TBI (traumatic brain injury) (HCC) ?  Stroke [facility]) ?  Elevated CK ?  Constipation ?  Aspiration pneumonia (HCC) ?  Fall ?  UTI (urinary tract infection) ? ?UTI.  Most likely having increased frequency and urgency causing incontinence.  Could not tell any symptoms.  Remained afebrile.  UA with pyuria. ?-Urine cultures with multiple species ?-Continue Bactrim and complete a 5-day course ? ?Change in behavior.  Overnight patient was voiding on himself and not letting anyone come closer to clean.  There was also nursing concern of choking spell overnight. ?Patient has had this type of behavior before. ?Another message sent to psych for evaluation. ?-Check UA and do chest x-ray to rule out any UTI or aspiration. ? ?Schizophrenia: chronic condition with acute exacerbation. Psychiatry followed periodically here due to ongoing safety issues related to patient's behavioral disturbances. Continue on ativan as per psych.  Psychiatry reviewed recent clinical status, increasing clozapine to 375, considering increaase to 400 next week ?  ?Fall: on 3/24, noted to have fall w/o LOC while  ambulating with staff. Noted to have fallen on L knee. Pt reportedly had no subsequent knee pain and was able to ambulate without difficulty ?  ?Aspiration pneumonia: completed abx course. High risk for recurrent aspiration  ?  ?Constipation: continue on milk of mg as needed. ?  ?Elevated CK: likely secondary to IM injections as per  psych.  No CKs checked since 2/20 when it was 1629.  Reviewed CMP and CK was 77. ?  ?Hx of CVA: significant aphasia. Continue on aspirin, statin  ?  ?TBI: chronic  ?  ?Acute metabolic encephalopathy: at baseline. Resolved  ?  ?NSTEMI: completed IV heparin x 48hrs. Continue on aspirin, statin, metoprolol. Invasive evaluation deferred as per cardio  ?  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 Becoming very agitated and aggressive at times.  Ativan prn. Pursuing placement which is complicated. TOC and leadership involved in challenging disposition ?  ?Acute pulmonary edema: resolved as of 01/31/2022 ?  ?  ?DVT prophylaxis: SCD/Compression stockings  ?Code Status: dnr ? ? ?  ?Code Status Orders  ?(From admission, onward)  ?  ? ? ?  ? ?  Start     Ordered  ? 11/20/21 2032  Do not attempt resuscitation (DNR)  Continuous       ?Question Answer Comment  ?In the event of cardiac or respiratory ARREST Do not call a ?code blue?   ?In the event of cardiac or respiratory ARREST Do not perform Intubation, CPR, defibrillation or ACLS   ?In the event of cardiac or respiratory ARREST Use medication by any route, position, wound care, and other measures to relive pain and suffering. May use oxygen, suction and manual treatment of airway obstruction as needed for comfort.   ?Comments EDP verified DNR/DNI status with pt's mother   ?  ? 11/20/21 2031  ? ?  ?  ? ?  ? ?Code Status History   ? ? Date Active Date Inactive Code Status Order ID Comments User Context  ? 07/20/2021 2026 11/20/2021 2032 Full Code [id number]  Delton Prairie, MD ED  ? ?  ? ?Family Communication: none today  ?Disposition Plan:   no safe d/c plan ?Consults called: None ?Admission status: Inpatient ? ? ?Consultants:  ?psychiatry ? ?Procedures:  ?DG Chest 2 View ? ?Result Date: 02/18/2022 ?CLINICAL DATA:  Fever and cough. EXAM: CHEST - 2 VIEW COMPARISON:  Chest x-ray dated November 20, 2021. FINDINGS: Unchanged cardiomegaly. Improved  pulmonary vascular congestion compared to the prior study. New patchy asymmetric opacities in the right lower lobe with trace right pleural effusion. No pneumothorax. No acute osseous abnormality. IMPRESSION: 1. Right lower lobe pneumonia. Electronically Signed   By: Obie Dredge M.D.   On: 02/18/2022 10:29  ? ?DG Abd 1 View ? ?Result Date: 02/19/2022 ?CLINICAL DATA:  Fever EXAM: ABDOMEN - 1 VIEW COMPARISON:  None. FINDINGS: The bowel gas pattern is normal. No radio-opaque calculi or other significant radiographic abnormality are seen. IMPRESSION: Negative. Electronically Signed   By: Duanne Guess D.O.   On: 02/19/2022 15:10  ? ?DG Chest Port 1 View ? ?Result Date: 03/07/2022 ?CLINICAL DATA:  Cough. EXAM: PORTABLE CHEST 1 VIEW COMPARISON:  Chest x-ray from today at 0625 hours. FINDINGS: The patient is rotated to the left, limiting evaluation. Stable cardiomegaly. No focal consolidation, pleural effusion, or pneumothorax. No acute osseous abnormality. IMPRESSION: No active disease. Electronically Signed   By: Obie Dredge M.D.   On: 03/07/2022 15:56  ? ?DG Chest Port 1 View ? ?  Result Date: 03/07/2022 ?CLINICAL DATA:  Tachycardia. EXAM: PORTABLE CHEST 1 VIEW COMPARISON:  Chest x-ray dated February 18, 2022. FINDINGS: The patient is rotated to the right, accentuating the upper mediastinal contours. Stable cardiomegaly. Chronic mild pulmonary vascular congestion. Resolved right lower lobe pneumonia. No focal consolidation, pleural effusion, or pneumothorax. No acute osseous abnormality. IMPRESSION: 1. No active disease. Resolved right lower lobe pneumonia. Electronically Signed   By: Obie Dredge M.D.   On: 03/07/2022 08:03   ? ? ?Subjective: ?Patient was resting comfortably when seen today.  Much more calmer than yesterday. ? ?Objective: ?Vitals:  ? 03/09/22 0437 03/09/22 1026 03/09/22 2135 03/10/22 2042  ?BP: 105/70 124/76 136/89 132/82  ?Pulse: 79 77 98 (!) 50  ?Resp: 16 18 19    ?Temp: 98.5 ?F (36.9 ?C) 98.3 ?F  (36.8 ?C) (!) 97.5 ?F (36.4 ?C) 98.6 ?F (37 ?C)  ?TempSrc: Oral Oral Oral Oral  ?SpO2: 98% 99% 100% 93%  ?Weight:      ?Height:      ? ? ?Intake/Output Summary (Last 24 hours) at 03/11/2022 1446 ?Last data filed at 03/10/2022

## 2022-03-12 DIAGNOSIS — F209 Schizophrenia, unspecified: Secondary | ICD-10-CM | POA: Diagnosis not present

## 2022-03-12 LAB — CLOZAPINE (CLOZARIL)
Clozapine Lvl: 920 ng/mL — ABNORMAL HIGH (ref 350–650)
NorClozapine: 297 ng/mL
Total(Cloz+Norcloz): 1217 ng/mL

## 2022-03-12 MED ORDER — CLOZAPINE 100 MG PO TABS
300.0000 mg | ORAL_TABLET | Freq: Every day | ORAL | Status: DC
  Administered 2022-03-12 – 2022-03-13 (×2): 300 mg via ORAL
  Filled 2022-03-12 (×3): qty 3

## 2022-03-12 NOTE — Progress Notes (Signed)
?PROGRESS NOTE ? ? ? ?Adam [name]  [id number] DOB: 30-Nov-1961 DOA: 07/20/2021 ?PCP: Pcp, No  ? ?Brief Narrative:  ?61 yo WM with hx of schizophrenia and TBI, who was initially admitted to [facility] ER on July 20, 2021. He spent more than 123 days in the [facility] ED awaiting psychiatric placement.  He was initially brought to [facility] ER due to violent outbursts at his group home.  He was IVC'd initially.  He was noted to be lethargic 12/19, found to have NSTEMI.  Pt was admitted to hospitalist service.  Cardiology consulted who recommended no intervention.   ? After admission to the medical service patient became severely agitated on the floor.  Pt required to ICU night of 12/24 for precedex gtt due to severe agitation and combative behavior.  PCCM was consulted.  Precedex felt to be ineffective and was turned off.  Pt was transferred back to floor on 1/4.  Hospital course remarkable for agitation, violent outbursts. Psychiatry was consulted,making some medication changes.  They do not accept pt to inpatient psych here and recommend continued search for longer term placement.  Senior management involved.  Plan for placement on a group home. ? Patient has been here 200 +  days so  far.  He is difficult to place. ? ?4/5: After staying stable for many days, last night patient again becoming agitated, voiding on himself and not letting any staff to come close to clean.  There was significant urine smell in the room.  There was also some nursing concern that he was choking last night.  Chest x-ray and UA ordered. ?Patient needs to be evaluated by psych and is appropriate for behavioral health admission instead of staying on medical floor.  He has shown this type of behavior many time during current prolonged hospitalization requiring psych intervention. ? ?4/6: UA with pyuria, most likely having increased urinary frequency and urgency causing incontinence.  Cultures were sent and he was started on Bactrim.  Remained  afebrile. ? ?4/7: Urine culture with multiple species.  Patient is already on Bactrim and we will complete a 5-day course as clinically seems improving.  Urinary urgency resolved. ? ?Assessment & Plan: ?  ?Principal Problem: ?  Schizophrenia, chronic condition with acute exacerbation ([place]) ?Active Problems: ?  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  DNR (do not resuscitate)/DNI(Do Not Intubate) ?  NSTEMI (non-ST elevated myocardial infarction) ([place]) ?  Acute metabolic encephalopathy ?  TBI (traumatic brain injury) ([place]) ?  Stroke [facility]) ?  Elevated CK ?  Constipation ?  Aspiration pneumonia ([place]) ?  Fall ?  UTI (urinary tract infection) ? ?UTI.  Most likely having increased frequency and urgency causing incontinence.  Could not tell any symptoms.  Remained afebrile.  UA with pyuria. ?-Urine cultures with multiple species ?-Continue Bactrim and complete a 5-day course ? ?Change in behavior.  Most likely had UTI causing urinary urgency and frequency which has been resolved now.  Currently behavior at his baseline. ? ?Schizophrenia: chronic condition with acute exacerbation. Psychiatry followed periodically here due to ongoing safety issues related to patient's behavioral disturbances. Continue on ativan as per psych.  Psychiatry reviewed recent clinical status, elevated clonazepam levels so psychiatry decrease the dose to 300 mg at night. ?  ?Fall: on 3/24, noted to have fall w/o LOC while ambulating with staff. Noted to have fallen on L knee. Pt reportedly had no subsequent knee pain and was able to ambulate without difficulty ?  ?Aspiration pneumonia: completed  abx course. High risk for recurrent aspiration  ?  ?Constipation: continue on milk of mg as needed. ?  ?Elevated CK: likely secondary to IM injections as per psych.  No CKs checked since 2/20 when it was 1629.  Reviewed CMP and CK was 77. ?  ?Hx of CVA: significant aphasia. Continue on aspirin, statin  ?  ?TBI: chronic  ?   ?Acute metabolic encephalopathy: at baseline. Resolved  ?  ?NSTEMI: completed IV heparin x 48hrs. Continue on aspirin, statin, metoprolol. Invasive evaluation deferred as per cardio  ?  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 Becoming very agitated and aggressive at times.  Ativan prn. Pursuing placement which is complicated. TOC and leadership involved in challenging disposition ?  ?Acute pulmonary edema: resolved as of 01/31/2022 ?  ?  ?DVT prophylaxis: SCD/Compression stockings  ?Code Status: dnr ? ? ?  ?Code Status Orders  ?(From admission, onward)  ?  ? ? ?  ? ?  Start     Ordered  ? 11/20/21 2032  Do not attempt resuscitation (DNR)  Continuous       ?Question Answer Comment  ?In the event of cardiac or respiratory ARREST Do not call a ?code blue?   ?In the event of cardiac or respiratory ARREST Do not perform Intubation, CPR, defibrillation or ACLS   ?In the event of cardiac or respiratory ARREST Use medication by any route, position, wound care, and other measures to relive pain and suffering. May use oxygen, suction and manual treatment of airway obstruction as needed for comfort.   ?Comments EDP verified DNR/DNI status with pt's mother   ?  ? 11/20/21 2031  ? ?  ?  ? ?  ? ?Code Status History   ? ? Date Active Date Inactive Code Status Order ID Comments User Context  ? 07/20/2021 2026 11/20/2021 2032 Full Code [id number]  Vladimir Crofts, MD ED  ? ?  ? ?Family Communication: none today  ?Disposition Plan:   no safe d/c plan ?Consults called: None ?Admission status: Inpatient ? ? ?Consultants:  ?psychiatry ? ?Procedures:  ?DG Chest 2 View ? ?Result Date: 02/18/2022 ?CLINICAL DATA:  Fever and cough. EXAM: CHEST - 2 VIEW COMPARISON:  Chest x-ray dated November 20, 2021. FINDINGS: Unchanged cardiomegaly. Improved pulmonary vascular congestion compared to the prior study. New patchy asymmetric opacities in the right lower lobe with trace right pleural effusion. No pneumothorax. No  acute osseous abnormality. IMPRESSION: 1. Right lower lobe pneumonia. Electronically Signed   By: Titus Dubin M.D.   On: 02/18/2022 10:29  ? ?DG Abd 1 View ? ?Result Date: 02/19/2022 ?CLINICAL DATA:  Fever EXAM: ABDOMEN - 1 VIEW COMPARISON:  None. FINDINGS: The bowel gas pattern is normal. No radio-opaque calculi or other significant radiographic abnormality are seen. IMPRESSION: Negative. Electronically Signed   By: Davina Poke D.O.   On: 02/19/2022 15:10  ? ?DG Chest Port 1 View ? ?Result Date: 03/07/2022 ?CLINICAL DATA:  Cough. EXAM: PORTABLE CHEST 1 VIEW COMPARISON:  Chest x-ray from today at 0625 hours. FINDINGS: The patient is rotated to the left, limiting evaluation. Stable cardiomegaly. No focal consolidation, pleural effusion, or pneumothorax. No acute osseous abnormality. IMPRESSION: No active disease. Electronically Signed   By: Titus Dubin M.D.   On: 03/07/2022 15:56  ? ?DG Chest Port 1 View ? ?Result Date: 03/07/2022 ?CLINICAL DATA:  Tachycardia. EXAM: PORTABLE CHEST 1 VIEW COMPARISON:  Chest x-ray dated February 18, 2022. FINDINGS: The patient is rotated to the right,  accentuating the upper mediastinal contours. Stable cardiomegaly. Chronic mild pulmonary vascular congestion. Resolved right lower lobe pneumonia. No focal consolidation, pleural effusion, or pneumothorax. No acute osseous abnormality. IMPRESSION: 1. No active disease. Resolved right lower lobe pneumonia. Electronically Signed   By: Titus Dubin M.D.   On: 03/07/2022 08:03   ? ? ?Subjective: ?Patient was much calmer today, no new concerns.. ? ?Objective: ?Vitals:  ? 03/09/22 2135 03/10/22 2042 03/11/22 1931 03/12/22 1236  ?BP:  132/82 112/85 (!) 126/108  ?Pulse: 98 (!) 50 96 100  ?Resp: 19     ?Temp:  98.6 ?F (37 ?C) (!) 97.5 ?F (36.4 ?C) (!) 97.4 ?F (36.3 ?C)  ?TempSrc: Oral Oral Oral Oral  ?SpO2: 100% 93% 97% 99%  ?Weight:      ?Height:      ? ? ?Intake/Output Summary (Last 24 hours) at 03/12/2022 1427 ?Last data filed at  03/11/2022 2000 ?Gross per 24 hour  ?Intake 240 ml  ?Output --  ?Net 240 ml  ? ? ?Filed Weights  ? 07/20/21 1635 12/07/21 2025 02/16/22 0229  ?Weight: 79.4 kg 80 kg 85.8 kg  ? ? ?Examination: ? ?General.     In no acute distress.

## 2022-03-12 NOTE — TOC Progression Note (Signed)
Transition of Care (TOC) - Progression Note  ? ? ?Patient Details  ?Name: Adam [name] ?MRN: [id number] ?Date of Birth: 24-Oct-1961 ? ?Transition of Care (TOC) CM/SW Contact  ?Brylan Seubert A Righteous [place], LCSW ?Phone Number: ?03/12/2022, 11:40 AM ? ?Clinical Narrative:  No new updated. Pt still remains on waitlist at  [facility], [place]. TOC Director and Floor Soil scientist also working on placement.   ? ? ? ?Expected Discharge Plan: Group Home ?Barriers to Discharge: Continued Medical Work up ? ?Expected Discharge Plan and Services ?Expected Discharge Plan: Group Home ?In-house Referral: Clinical Social Work ?  ?  ?Living arrangements for the past 2 months: Group Home ?                ?  ?  ?  ?  ?  ?  ?  ?  ?  ?  ? ? ?Social Determinants of Health (SDOH) Interventions ?  ? ?Readmission Risk Interventions ?   ? View : No data to display.  ?  ?  ?  ? ? ?

## 2022-03-12 NOTE — TOC Progression Note (Signed)
Transition of Care (TOC) - Progression Note  ? ? ?Patient Details  ?Name: Adam [name] ?MRN: [id number] ?Date of Birth: [birth year] ? ?Transition of Care (TOC) CM/SW Contact  ?Anselm Pancoast, RN ?Phone Number: ?03/12/2022, 4:28 PM ? ?Clinical Narrative:    ?LVMM for Program manager Love Bruener [phone] requesting update on wait list for [facility], [place] and [place]. Updated on improvements in behaviors with structure of new room placement. Offered to send updated notes.  ? ? ?Expected Discharge Plan: Group Home ?Barriers to Discharge: Continued Medical Work up ? ?Expected Discharge Plan and Services ?Expected Discharge Plan: Group Home ?In-house Referral: Clinical Social Work ?  ?  ?Living arrangements for the past 2 months: Group Home ?                ?  ?  ?  ?  ?  ?  ?  ?  ?  ?  ? ? ?Social Determinants of Health (SDOH) Interventions ?  ? ?Readmission Risk Interventions ?   ? View : No data to display.  ?  ?  ?  ? ? ?

## 2022-03-12 NOTE — Consult Note (Signed)
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 male patient admitted with "yes".  HPI: Patient seen for follow-up today.  Came by after lunchtime.  Patient was up pacing around his room neatly dressed.  Appeared to recognize me.  Made eye contact.  Could answer yes or no to some questions although it was unclear how much meaning there was to it.  He indicated to me proudly his distended abdomen denied however that he was constipated or having any discomfort.  Patient was not threatening or aggressive.  Did not appear to be responding to internal stimuli.  Appears much physically better compared to last time I saw him.  Blood level of clozapine finally came back with the total clozapine level over 900 a gigantic increase since the last time I checked his level.  Past Psychiatric History: Longstanding chronic behavior problems due to a combination of cortical injury and history of mental illness  Risk to Self:   Risk to Others:   Prior Inpatient Therapy:   Prior Outpatient Therapy:    Past Medical History:  Past Medical History:  Diagnosis Date   Myocardial infarction (HCC)    Schizophrenia  (HCC)    Stroke (HCC)    TBI (traumatic brain injury)    History reviewed. No pertinent surgical history. Family History: History reviewed. No pertinent family history. Family Psychiatric  History: See previous Social History:  Social History   Substance and Sexual Activity  Alcohol Use Not Currently     Social History   Substance and Sexual Activity  Drug Use Not Currently    Social History   Socioeconomic History   Marital status: Single    Spouse name: Not on file   Number of children: Not on file   Years of education: Not on file   Highest education level: Not on file  Occupational History   Not on file  Tobacco Use   Smoking status: Never    Passive exposure: Never   Smokeless tobacco: Never  Vaping Use   Vaping Use: Unknown  Substance and Sexual Activity   Alcohol use: Not Currently   Drug use: Not Currently   Sexual activity: Not Currently  Other Topics Concern   Not on file  Social History Narrative   Not on file   Social Determinants of Health   Financial Resource Strain: Not on file  Food Insecurity: Not on file  Transportation Needs: Not on file  Physical Activity: Not on file  Stress: Not on file  Social Connections: Not on file   Additional Social History:    Allergies:   Allergies  Allergen Reactions   Morphine And Related    Nsaids     Labs: No results found for this or any previous visit (from the  past 48 hour(s)).  Current Facility-Administered Medications  Medication Dose Route Frequency Provider Last Rate Last Admin   acetaminophen (TYLENOL) tablet 650 mg  650 mg Oral Q4H PRN Carollee Herter, DO   650 mg at 02/26/22 1017   alum & mag hydroxide-simeth (MAALOX/MYLANTA) 200-200-20 MG/5ML suspension 30 mL  30 mL Oral Q6H PRN Lurene Shadow, MD   30 mL at 02/19/22 0830   aspirin chewable tablet 81 mg  81 mg Oral Daily Lolita Patella B, MD   81 mg at 03/12/22 0946   atorvastatin (LIPITOR) tablet 20 mg  20 mg Oral Daily Enedina Finner, MD    20 mg at 03/12/22 0946   benztropine (COGENTIN) tablet 0.5 mg  0.5 mg Oral BID Carollee Herter, DO   0.5 mg at 03/12/22 4098   bisacodyl (DULCOLAX) EC tablet 10 mg  10 mg Oral Daily PRN Gillis Santa, MD   10 mg at 03/12/22 0945   clonazePAM (KLONOPIN) tablet 1 mg  1 mg Oral TID He, [name], MD   1 mg at 03/12/22 0945   cloZAPine (CLOZARIL) tablet 300 mg  300 mg Oral QHS Juel Ripley, [name], MD       diphenhydrAMINE (BENADRYL) capsule 50 mg  50 mg Oral Q4H PRN He, [name], MD   50 mg at 02/22/22 2016   Or   diphenhydrAMINE (BENADRYL) injection 50 mg  50 mg Intramuscular Q4H PRN He, [name], MD   50 mg at 02/27/22 1727   gabapentin (NEURONTIN) tablet 300 mg  300 mg Oral TID Wallace Going D, RPH   300 mg at 03/12/22 0946   guaiFENesin-dextromethorphan (ROBITUSSIN DM) 100-10 MG/5ML syrup 5 mL  5 mL Oral Q4H PRN Delfino Lovett, MD   5 mL at 02/18/22 0552   haloperidol (HALDOL) tablet 5 mg  5 mg Oral TID [place], Eric J, DO   5 mg at 03/12/22 0945   haloperidol (HALDOL) tablet 5 mg  5 mg Oral Q4H PRN He, [name], MD   5 mg at 02/22/22 2016   Or   haloperidol lactate (HALDOL) injection 5 mg  5 mg Intramuscular Q4H PRN He, [name], MD   5 mg at 02/27/22 1727   loperamide (IMODIUM) capsule 2 mg  2 mg Oral Q6H PRN Delfino Lovett, MD   2 mg at 02/20/22 0908   LORazepam (ATIVAN) injection 2 mg  2 mg Intramuscular Q4H PRN Stillman Buenger, [name], MD   2 mg at 02/20/22 1191   LORazepam (ATIVAN) tablet 1 mg  1 mg Oral Q4H PRN He, [name], MD   1 mg at 03/12/22 0947   magnesium hydroxide (MILK OF MAGNESIA) suspension 30 mL  30 mL Oral Daily PRN Arnetha Courser, MD   30 mL at 02/27/22 1322   metoprolol succinate (TOPROL-XL) 24 hr tablet 25 mg  25 mg Oral Daily Darlin Priestly, MD   25 mg at 03/12/22 0946   multivitamin with minerals tablet 1 tablet  1 tablet Oral Daily Carollee Herter, DO   1 tablet at 03/12/22 0946   paliperidone (INVEGA SUSTENNA) injection 234 mg  234 mg Intramuscular Q28 days Mayeli Bornhorst T, MD   234 mg at 02/27/22 1326   sodium chloride (OCEAN)  0.65 % nasal spray 1 spray  1 spray Each Nare PRN Delfino Lovett, MD   1 spray at 02/20/22 1506   sulfamethoxazole-trimethoprim (BACTRIM DS) 800-160 MG per tablet 1 tablet  1 tablet Oral Q12H Arnetha Courser, MD   1 tablet at 03/12/22 0946   temazepam (RESTORIL)  capsule 15 mg  15 mg Oral QHS Carollee Herter, DO   15 mg at 03/11/22 2051    Musculoskeletal: Strength & Muscle Tone: within normal limits Gait & Station: normal Patient leans: N/A            Psychiatric Specialty Exam:  Presentation  General Appearance: Disheveled  Eye Contact:None  Speech:Clear and Coherent  Speech Volume:Increased  Handedness:Right   Mood and Affect  Mood:Anxious; Irritable  Affect:Congruent   Thought Process  Thought Processes:Disorganized  Descriptions of Associations:Loose  Orientation:Partial  Thought Content:Scattered; Illogical  History of Schizophrenia/Schizoaffective disorder:Yes  Duration of Psychotic Symptoms:Greater than six months  Hallucinations:No data recorded Ideas of Reference:-- (UTA)  Suicidal Thoughts:No data recorded Homicidal Thoughts:No data recorded  Sensorium  Memory:Remote Poor; Recent Poor; Immediate Poor; Other (comment) (impaired memory and cognition)  Judgment:Impaired  Insight:Lacking   Executive Functions  Concentration:Poor  Attention Span:Poor  Recall:Poor  Fund of Knowledge:Poor  Language:Poor   Psychomotor Activity  Psychomotor Activity:No data recorded  Assets  Assets:Housing; Resilience; Social Support; Talents/Skills   Sleep  Sleep:No data recorded  Physical Exam: Physical Exam Vitals and nursing note reviewed.  Constitutional:      Appearance: Normal appearance.  HENT:     Head: Normocephalic and atraumatic.     Mouth/Throat:     Pharynx: Oropharynx is clear.  Eyes:     Pupils: Pupils are equal, round, and reactive to light.  Cardiovascular:     Rate and Rhythm: Normal rate and regular rhythm.  Pulmonary:      Effort: Pulmonary effort is normal.     Breath sounds: Normal breath sounds.  Abdominal:     General: Abdomen is flat.     Palpations: Abdomen is soft.  Musculoskeletal:        General: Normal range of motion.  Skin:    General: Skin is warm and dry.  Neurological:     General: No focal deficit present.     Mental Status: He is alert. Mental status is at baseline.  Psychiatric:        Attention and Perception: He is inattentive.        Mood and Affect: Mood normal. Affect is inappropriate.        Speech: He is noncommunicative.        Behavior: Behavior is agitated. Behavior is not aggressive.        Cognition and Memory: Cognition is impaired.        Judgment: Judgment is inappropriate.   ROS Blood pressure (!) 126/108, pulse 100, temperature (!) 97.4 F (36.3 C), temperature source Oral, resp. rate 19, height 5\' 8"  (1.727 m), weight 85.8 kg, SpO2 99 %. Body mass index is 28.76 kg/m.  Treatment Plan Summary: Medication management and Plan because of the elevated Clozapine level I am going to drop his dosage back down to 300 mg at night.  He does not appear to be over sedated by it and I still do not think we have any reason to think that sedation from medicine was the cause of his mental status and behavior change last week.  He appears to be leveling back out.  Continue current medicine and current level of behavioral observation  Disposition:  Patient ultimately is awaiting discharge planning  Mordecai Rasmussen, MD 03/12/2022 1:54 PM

## 2022-03-13 DIAGNOSIS — F209 Schizophrenia, unspecified: Secondary | ICD-10-CM | POA: Diagnosis not present

## 2022-03-13 NOTE — Progress Notes (Signed)
?PROGRESS NOTE ? ? ? ?Adam [name]  [id number] DOB: 30-Nov-1961 DOA: 07/20/2021 ?PCP: Pcp, No  ? ?Brief Narrative:  ?61 yo WM with hx of schizophrenia and TBI, who was initially admitted to [facility] ER on July 20, 2021. He spent more than 123 days in the [facility] ED awaiting psychiatric placement.  He was initially brought to [facility] ER due to violent outbursts at his group home.  He was IVC'd initially.  He was noted to be lethargic 12/19, found to have NSTEMI.  Pt was admitted to hospitalist service.  Cardiology consulted who recommended no intervention.   ? After admission to the medical service patient became severely agitated on the floor.  Pt required to ICU night of 12/24 for precedex gtt due to severe agitation and combative behavior.  PCCM was consulted.  Precedex felt to be ineffective and was turned off.  Pt was transferred back to floor on 1/4.  Hospital course remarkable for agitation, violent outbursts. Psychiatry was consulted,making some medication changes.  They do not accept pt to inpatient psych here and recommend continued search for longer term placement.  Senior management involved.  Plan for placement on a group home. ? Patient has been here 200 +  days so  far.  He is difficult to place. ? ?4/5: After staying stable for many days, last night patient again becoming agitated, voiding on himself and not letting any staff to come close to clean.  There was significant urine smell in the room.  There was also some nursing concern that he was choking last night.  Chest x-ray and UA ordered. ?Patient needs to be evaluated by psych and is appropriate for behavioral health admission instead of staying on medical floor.  He has shown this type of behavior many time during current prolonged hospitalization requiring psych intervention. ? ?4/6: UA with pyuria, most likely having increased urinary frequency and urgency causing incontinence.  Cultures were sent and he was started on Bactrim.  Remained  afebrile. ? ?4/7: Urine culture with multiple species.  Patient is already on Bactrim and we will complete a 5-day course as clinically seems improving.  Urinary urgency resolved. ? ?Assessment & Plan: ?  ?Principal Problem: ?  Schizophrenia, chronic condition with acute exacerbation ([place]) ?Active Problems: ?  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  DNR (do not resuscitate)/DNI(Do Not Intubate) ?  NSTEMI (non-ST elevated myocardial infarction) ([place]) ?  Acute metabolic encephalopathy ?  TBI (traumatic brain injury) ([place]) ?  Stroke [facility]) ?  Elevated CK ?  Constipation ?  Aspiration pneumonia ([place]) ?  Fall ?  UTI (urinary tract infection) ? ?UTI.  Most likely having increased frequency and urgency causing incontinence.  Could not tell any symptoms.  Remained afebrile.  UA with pyuria. ?-Urine cultures with multiple species ?-Continue Bactrim and complete a 5-day course ? ?Change in behavior.  Most likely had UTI causing urinary urgency and frequency which has been resolved now.  Currently behavior at his baseline. ? ?Schizophrenia: chronic condition with acute exacerbation. Psychiatry followed periodically here due to ongoing safety issues related to patient's behavioral disturbances. Continue on ativan as per psych.  Psychiatry reviewed recent clinical status, elevated clonazepam levels so psychiatry decrease the dose to 300 mg at night. ?  ?Fall: on 3/24, noted to have fall w/o LOC while ambulating with staff. Noted to have fallen on L knee. Pt reportedly had no subsequent knee pain and was able to ambulate without difficulty ?  ?Aspiration pneumonia: completed  abx course. High risk for recurrent aspiration  ?  ?Constipation: continue on milk of mg as needed. ?  ?Elevated CK: likely secondary to IM injections as per psych.  No CKs checked since 2/20 when it was 1629.  Reviewed CMP and CK was 77. ?  ?Hx of CVA: significant aphasia. Continue on aspirin, statin  ?  ?TBI: chronic  ?   ?Acute metabolic encephalopathy: at baseline. Resolved  ?  ?NSTEMI: completed IV heparin x 48hrs. Continue on aspirin, statin, metoprolol. Invasive evaluation deferred as per cardio  ?  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 Becoming very agitated and aggressive at times.  Ativan prn. Pursuing placement which is complicated. TOC and leadership involved in challenging disposition ?  ?Acute pulmonary edema: resolved as of 01/31/2022 ?  ?  ?DVT prophylaxis: SCD/Compression stockings  ?Code Status: dnr ? ? ?  ?Code Status Orders  ?(From admission, onward)  ?  ? ? ?  ? ?  Start     Ordered  ? 11/20/21 2032  Do not attempt resuscitation (DNR)  Continuous       ?Question Answer Comment  ?In the event of cardiac or respiratory ARREST Do not call a ?code blue?   ?In the event of cardiac or respiratory ARREST Do not perform Intubation, CPR, defibrillation or ACLS   ?In the event of cardiac or respiratory ARREST Use medication by any route, position, wound care, and other measures to relive pain and suffering. May use oxygen, suction and manual treatment of airway obstruction as needed for comfort.   ?Comments EDP verified DNR/DNI status with pt's mother   ?  ? 11/20/21 2031  ? ?  ?  ? ?  ? ?Code Status History   ? ? Date Active Date Inactive Code Status Order ID Comments User Context  ? 07/20/2021 2026 11/20/2021 2032 Full Code [id number]  Vladimir Crofts, MD ED  ? ?  ? ?Family Communication: none today  ?Disposition Plan:   no safe d/c plan ?Consults called: None ?Admission status: Inpatient ? ? ?Consultants:  ?psychiatry ? ?Procedures:  ?DG Chest 2 View ? ?Result Date: 02/18/2022 ?CLINICAL DATA:  Fever and cough. EXAM: CHEST - 2 VIEW COMPARISON:  Chest x-ray dated November 20, 2021. FINDINGS: Unchanged cardiomegaly. Improved pulmonary vascular congestion compared to the prior study. New patchy asymmetric opacities in the right lower lobe with trace right pleural effusion. No pneumothorax. No  acute osseous abnormality. IMPRESSION: 1. Right lower lobe pneumonia. Electronically Signed   By: Titus Dubin M.D.   On: 02/18/2022 10:29  ? ?DG Abd 1 View ? ?Result Date: 02/19/2022 ?CLINICAL DATA:  Fever EXAM: ABDOMEN - 1 VIEW COMPARISON:  None. FINDINGS: The bowel gas pattern is normal. No radio-opaque calculi or other significant radiographic abnormality are seen. IMPRESSION: Negative. Electronically Signed   By: Davina Poke D.O.   On: 02/19/2022 15:10  ? ?DG Chest Port 1 View ? ?Result Date: 03/07/2022 ?CLINICAL DATA:  Cough. EXAM: PORTABLE CHEST 1 VIEW COMPARISON:  Chest x-ray from today at 0625 hours. FINDINGS: The patient is rotated to the left, limiting evaluation. Stable cardiomegaly. No focal consolidation, pleural effusion, or pneumothorax. No acute osseous abnormality. IMPRESSION: No active disease. Electronically Signed   By: Titus Dubin M.D.   On: 03/07/2022 15:56  ? ?DG Chest Port 1 View ? ?Result Date: 03/07/2022 ?CLINICAL DATA:  Tachycardia. EXAM: PORTABLE CHEST 1 VIEW COMPARISON:  Chest x-ray dated February 18, 2022. FINDINGS: The patient is rotated to the right,  accentuating the upper mediastinal contours. Stable cardiomegaly. Chronic mild pulmonary vascular congestion. Resolved right lower lobe pneumonia. No focal consolidation, pleural effusion, or pneumothorax. No acute osseous abnormality. IMPRESSION: 1. No active disease. Resolved right lower lobe pneumonia. Electronically Signed   By: Obie Dredge M.D.   On: 03/07/2022 08:03   ? ? ?Subjective: ?Patient has no acute concerns today. ? ?Objective: ?Vitals:  ? 03/12/22 1236 03/12/22 1619 03/12/22 2139 03/13/22 1122  ?BP: (!) 126/108 117/81 106/77 122/87  ?Pulse: 100 97 98 (!) 102  ?Resp:   16 17  ?Temp: (!) 97.4 ?F (36.3 ?C) 97.9 ?F (36.6 ?C) 97.6 ?F (36.4 ?C) (!) 97.4 ?F (36.3 ?C)  ?TempSrc: Oral Oral Oral Oral  ?SpO2: 99% 99% 99% 100%  ?Weight:      ?Height:      ? ?No intake or output data in the 24 hours ending 03/13/22  1458 ? ?Filed Weights  ? 07/20/21 1635 12/07/21 2025 02/16/22 0229  ?Weight: 79.4 kg 80 kg 85.8 kg  ? ? ?Examination: ? ?General.     In no acute distress. ?Pulmonary.  Lungs clear bilaterally, normal respiratory effort. ?CV.  [place]

## 2022-03-14 DIAGNOSIS — F209 Schizophrenia, unspecified: Secondary | ICD-10-CM | POA: Diagnosis not present

## 2022-03-14 LAB — CBC WITH DIFFERENTIAL/PLATELET
Abs Immature Granulocytes: 0.03 10*3/uL (ref 0.00–0.07)
Basophils Absolute: 0 10*3/uL (ref 0.0–0.1)
Basophils Relative: 1 %
Eosinophils Absolute: 0 10*3/uL (ref 0.0–0.5)
Eosinophils Relative: 0 %
HCT: 37.6 % — ABNORMAL LOW (ref 39.0–52.0)
Hemoglobin: 12.5 g/dL — ABNORMAL LOW (ref 13.0–17.0)
Immature Granulocytes: 1 %
Lymphocytes Relative: 42 %
Lymphs Abs: 1.6 10*3/uL (ref 0.7–4.0)
MCH: 30.1 pg (ref 26.0–34.0)
MCHC: 33.2 g/dL (ref 30.0–36.0)
MCV: 90.6 fL (ref 80.0–100.0)
Monocytes Absolute: 0.4 10*3/uL (ref 0.1–1.0)
Monocytes Relative: 12 %
Neutro Abs: 1.7 10*3/uL (ref 1.7–7.7)
Neutrophils Relative %: 44 %
Platelets: 205 10*3/uL (ref 150–400)
RBC: 4.15 MIL/uL — ABNORMAL LOW (ref 4.22–5.81)
RDW: 12.9 % (ref 11.5–15.5)
WBC: 3.8 10*3/uL — ABNORMAL LOW (ref 4.0–10.5)
nRBC: 0 % (ref 0.0–0.2)

## 2022-03-14 NOTE — Progress Notes (Signed)
?PROGRESS NOTE ? ? ? ?Adam [name]  [id number] DOB: 1961/01/13 DOA: 07/20/2021 ?PCP: Pcp, No  ? ?Brief Narrative:  ?61 yo WM with hx of schizophrenia and TBI, who was initially admitted to [facility] ER on July 20, 2021. He spent more than 123 days in the [facility] ED awaiting psychiatric placement.  He was initially brought to [facility] ER due to violent outbursts at his group home.  He was IVC'd initially.  He was noted to be lethargic 12/19, found to have NSTEMI.  Pt was admitted to hospitalist service.  Cardiology consulted who recommended no intervention.   ? After admission to the medical service patient became severely agitated on the floor.  Pt required to ICU night of 12/24 for precedex gtt due to severe agitation and combative behavior.  PCCM was consulted.  Precedex felt to be ineffective and was turned off.  Pt was transferred back to floor on 1/4.  Hospital course remarkable for agitation, violent outbursts. Psychiatry was consulted,making some medication changes.  They do not accept pt to inpatient psych here and recommend continued search for longer term placement.  Senior management involved.  Plan for placement on a group home. ? Patient has been here 200 +  days so  far.  He is difficult to place. ? ?4/5: After staying stable for many days, last night patient again becoming agitated, voiding on himself and not letting any staff to come close to clean.  There was significant urine smell in the room.  There was also some nursing concern that he was choking last night.  Chest x-ray and UA ordered. ?Patient needs to be evaluated by psych and is appropriate for behavioral health admission instead of staying on medical floor.  He has shown this type of behavior many time during current prolonged hospitalization requiring psych intervention. ? ?4/6: UA with pyuria, most likely having increased urinary frequency and urgency causing incontinence.  Cultures were sent and he was started on Bactrim.  Remained  afebrile. ? ?4/7: Urine culture with multiple species.  Patient is already on Bactrim and we will complete a 5-day course as clinically seems improving.  Urinary urgency resolved. ?Completed the course of an antibiotic. ? ?Assessment & Plan: ?  ?Principal Problem: ?  Schizophrenia, chronic condition with acute exacerbation ([facility]) ?Active Problems: ?  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  DNR (do not resuscitate)/DNI(Do Not Intubate) ?  NSTEMI (non-ST elevated myocardial infarction) ([facility]) ?  Acute metabolic encephalopathy ?  TBI (traumatic brain injury) ([facility]) ?  Stroke [facility]) ?  Elevated CK ?  Constipation ?  Aspiration pneumonia ([facility]) ?  Fall ?  UTI (urinary tract infection) ? ?UTI.  Most likely having increased frequency and urgency causing incontinence.  Could not tell any symptoms.  Remained afebrile.  UA with pyuria. ?-Urine cultures with multiple species ?-Will complete the course of antibiotic ? ?Change in behavior.  Most likely had UTI causing urinary urgency and frequency which has been resolved now.  Currently behavior at his baseline. ? ?Schizophrenia: chronic condition with acute exacerbation. Psychiatry followed periodically here due to ongoing safety issues related to patient's behavioral disturbances. Continue on ativan as per psych.  Psychiatry reviewed recent clinical status, elevated clonazepam levels so psychiatry decrease the dose to 300 mg at night. ?  ?Fall: on 3/24, noted to have fall w/o LOC while ambulating with staff. Noted to have fallen on L knee. Pt reportedly had no subsequent knee pain and was able to ambulate without difficulty ?  ?  Aspiration pneumonia: completed abx course. High risk for recurrent aspiration  ?  ?Constipation: continue on milk of mg as needed. ?  ?Elevated CK: likely secondary to IM injections as per psych.  No CKs checked since 2/20 when it was 1629.  Reviewed CMP and CK was 77. ?  ?Hx of CVA: significant aphasia. Continue on  aspirin, statin  ?  ?TBI: chronic  ?  ?Acute metabolic encephalopathy: at baseline. Resolved  ?  ?NSTEMI: completed IV heparin x 48hrs. Continue on aspirin, statin, metoprolol. Invasive evaluation deferred as per cardio  ?  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 Becoming very agitated and aggressive at times.  Ativan prn. Pursuing placement which is complicated. TOC and leadership involved in challenging disposition ?  ?Acute pulmonary edema: resolved as of 01/31/2022 ?  ?  ?DVT prophylaxis: SCD/Compression stockings  ?Code Status: dnr ? ? ?  ?Code Status Orders  ?(From admission, onward)  ?  ? ? ?  ? ?  Start     Ordered  ? 11/20/21 2032  Do not attempt resuscitation (DNR)  Continuous       ?Question Answer Comment  ?In the event of cardiac or respiratory ARREST Do not call a ?code blue?   ?In the event of cardiac or respiratory ARREST Do not perform Intubation, CPR, defibrillation or ACLS   ?In the event of cardiac or respiratory ARREST Use medication by any route, position, wound care, and other measures to relive pain and suffering. May use oxygen, suction and manual treatment of airway obstruction as needed for comfort.   ?Comments EDP verified DNR/DNI status with pt's mother   ?  ? 11/20/21 2031  ? ?  ?  ? ?  ? ?Code Status History   ? ? Date Active Date Inactive Code Status Order ID Comments User Context  ? 07/20/2021 2026 11/20/2021 2032 Full Code [id number]  Delton Prairie, MD ED  ? ?  ? ?Family Communication: none today  ?Disposition Plan:   no safe d/c plan ?Consults called: None ?Admission status: Inpatient ? ? ?Consultants:  ?psychiatry ? ?Procedures:  ?DG Chest 2 View ? ?Result Date: 02/18/2022 ?CLINICAL DATA:  Fever and cough. EXAM: CHEST - 2 VIEW COMPARISON:  Chest x-ray dated November 20, 2021. FINDINGS: Unchanged cardiomegaly. Improved pulmonary vascular congestion compared to the prior study. New patchy asymmetric opacities in the right lower lobe with trace right  pleural effusion. No pneumothorax. No acute osseous abnormality. IMPRESSION: 1. Right lower lobe pneumonia. Electronically Signed   By: Obie Dredge M.D.   On: 02/18/2022 10:29  ? ?DG Abd 1 View ? ?Result Date: 02/19/2022 ?CLINICAL DATA:  Fever EXAM: ABDOMEN - 1 VIEW COMPARISON:  None. FINDINGS: The bowel gas pattern is normal. No radio-opaque calculi or other significant radiographic abnormality are seen. IMPRESSION: Negative. Electronically Signed   By: Duanne Guess D.O.   On: 02/19/2022 15:10  ? ?DG Chest Port 1 View ? ?Result Date: 03/07/2022 ?CLINICAL DATA:  Cough. EXAM: PORTABLE CHEST 1 VIEW COMPARISON:  Chest x-ray from today at 0625 hours. FINDINGS: The patient is rotated to the left, limiting evaluation. Stable cardiomegaly. No focal consolidation, pleural effusion, or pneumothorax. No acute osseous abnormality. IMPRESSION: No active disease. Electronically Signed   By: Obie Dredge M.D.   On: 03/07/2022 15:56  ? ?DG Chest Port 1 View ? ?Result Date: 03/07/2022 ?CLINICAL DATA:  Tachycardia. EXAM: PORTABLE CHEST 1 VIEW COMPARISON:  Chest x-ray dated February 18, 2022. FINDINGS: The patient is rotated  to the right, accentuating the upper mediastinal contours. Stable cardiomegaly. Chronic mild pulmonary vascular congestion. Resolved right lower lobe pneumonia. No focal consolidation, pleural effusion, or pneumothorax. No acute osseous abnormality. IMPRESSION: 1. No active disease. Resolved right lower lobe pneumonia. Electronically Signed   By: Obie Dredge M.D.   On: 03/07/2022 08:03   ? ? ?Subjective: ?Patient was resting comfortably when seen today.  No new concerns. ? ?Objective: ?Vitals:  ? 03/12/22 2139 03/13/22 1122 03/13/22 1920 03/14/22 0658  ?BP: 106/77 122/87 113/90 107/82  ?Pulse: 98 (!) 102 (!) 104 74  ?Resp: 16 17  16   ?Temp: 97.6 ?F (36.4 ?C) (!) 97.4 ?F (36.3 ?C) 97.6 ?F (36.4 ?C)   ?TempSrc: Oral Oral Oral   ?SpO2: 99% 100% 99% 99%  ?Weight:      ?Height:      ? ?No intake or output data  in the 24 hours ending 03/14/22 1522 ? ?Filed Weights  ? 07/20/21 1635 12/07/21 2025 02/16/22 0229  ?Weight: 79.4 kg 80 kg 85.8 kg  ? ? ?Examination: ? ?General.     In no acute distress. ?Pulmonary.  Lungs clear bilaterally,

## 2022-03-15 DIAGNOSIS — F209 Schizophrenia, unspecified: Secondary | ICD-10-CM | POA: Diagnosis not present

## 2022-03-15 LAB — CBC WITH DIFFERENTIAL/PLATELET
Abs Immature Granulocytes: 0.04 10*3/uL (ref 0.00–0.07)
Basophils Absolute: 0.1 10*3/uL (ref 0.0–0.1)
Basophils Relative: 1 %
Eosinophils Absolute: 0 10*3/uL (ref 0.0–0.5)
Eosinophils Relative: 0 %
HCT: 37.6 % — ABNORMAL LOW (ref 39.0–52.0)
Hemoglobin: 12.5 g/dL — ABNORMAL LOW (ref 13.0–17.0)
Immature Granulocytes: 1 %
Lymphocytes Relative: 46 %
Lymphs Abs: 2 10*3/uL (ref 0.7–4.0)
MCH: 29.8 pg (ref 26.0–34.0)
MCHC: 33.2 g/dL (ref 30.0–36.0)
MCV: 89.7 fL (ref 80.0–100.0)
Monocytes Absolute: 0.5 10*3/uL (ref 0.1–1.0)
Monocytes Relative: 11 %
Neutro Abs: 1.8 10*3/uL (ref 1.7–7.7)
Neutrophils Relative %: 41 %
Platelets: 211 10*3/uL (ref 150–400)
RBC: 4.19 MIL/uL — ABNORMAL LOW (ref 4.22–5.81)
RDW: 12.8 % (ref 11.5–15.5)
WBC: 4.4 10*3/uL (ref 4.0–10.5)
nRBC: 0 % (ref 0.0–0.2)

## 2022-03-15 MED ORDER — CLOZAPINE 100 MG PO TABS
300.0000 mg | ORAL_TABLET | Freq: Every day | ORAL | Status: DC
  Administered 2022-03-15 – 2022-03-18 (×4): 300 mg via ORAL
  Filled 2022-03-15 (×4): qty 3

## 2022-03-15 NOTE — Progress Notes (Signed)
Clothes laundered. Perlie Mayo, RN ? ?

## 2022-03-15 NOTE — Progress Notes (Signed)
?PROGRESS NOTE ? ? ? ?Ance Redic  [id number] DOB: 1961/04/06 DOA: 07/20/2021 ?PCP: Pcp, No  ? ?Brief Narrative:  ?61 yo WM with hx of schizophrenia and TBI, who was initially admitted to [name] ER on July 20, 2021. He spent more than 123 days in the [facility] ED awaiting psychiatric placement.  He was initially brought to [facility] ER due to violent outbursts at his group home.  He was IVC'd initially.  He was noted to be lethargic 12/19, found to have NSTEMI.  Pt was admitted to hospitalist service.  Cardiology consulted who recommended no intervention.   ? After admission to the medical service patient became severely agitated on the floor.  Pt required to ICU night of 12/24 for precedex gtt due to severe agitation and combative behavior.  PCCM was consulted.  Precedex felt to be ineffective and was turned off.  Pt was transferred back to floor on 1/4.  Hospital course remarkable for agitation, violent outbursts. Psychiatry was consulted,making some medication changes.  They do not accept pt to inpatient psych here and recommend continued search for longer term placement.  Senior management involved.  Plan for placement on a group home. ? Patient has been here 200 +  days so  far.  He is difficult to place. ? ?4/5: After staying stable for many days, last night patient again becoming agitated, voiding on himself and not letting any staff to come close to clean.  There was significant urine smell in the room.  There was also some nursing concern that he was choking last night.  Chest x-ray and UA ordered. ?Patient needs to be evaluated by psych and is appropriate for behavioral health admission instead of staying on medical floor.  He has shown this type of behavior many time during current prolonged hospitalization requiring psych intervention. ? ?4/6: UA with pyuria, most likely having increased urinary frequency and urgency causing incontinence.  Cultures were sent and he was started on Bactrim.  Remained  afebrile. ? ?4/7: Urine culture with multiple species.  Patient is already on Bactrim and we will complete a 5-day course as clinically seems improving.  Urinary urgency resolved. ?Completed the course of an antibiotic. ? ?Assessment & Plan: ?  ?Principal Problem: ?  Schizophrenia, chronic condition with acute exacerbation ([place]) ?Active Problems: ?  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  DNR (do not resuscitate)/DNI(Do Not Intubate) ?  NSTEMI (non-ST elevated myocardial infarction) ([place]) ?  Acute metabolic encephalopathy ?  TBI (traumatic brain injury) ([place]) ?  Stroke [facility]) ?  Elevated CK ?  Constipation ?  Aspiration pneumonia ([place]) ?  Fall ?  UTI (urinary tract infection) ? ?UTI.  Most likely having increased frequency and urgency causing incontinence.  Could not tell any symptoms.  Remained afebrile.  UA with pyuria. ?-Urine cultures with multiple species ?-Completed a course of Bactrim. ? ?Change in behavior.  Most likely had UTI causing urinary urgency and frequency which has been resolved now.  Currently behavior at his baseline. ? ?Schizophrenia: chronic condition with acute exacerbation. Psychiatry followed periodically here due to ongoing safety issues related to patient's behavioral disturbances. Continue on ativan as per psych.  Psychiatry reviewed recent clinical status, elevated clonazepam levels so psychiatry decrease the dose to 300 mg at night. ?  ?Fall: on 3/24, noted to have fall w/o LOC while ambulating with staff. Noted to have fallen on L knee. Pt reportedly had no subsequent knee pain and was able to ambulate without difficulty ?  ?  Aspiration pneumonia: completed abx course. High risk for recurrent aspiration  ?  ?Constipation: continue on milk of mg as needed. ?  ?Elevated CK: likely secondary to IM injections as per psych.  No CKs checked since 2/20 when it was 1629.  Reviewed CMP and CK was 77. ?  ?Hx of CVA: significant aphasia. Continue on aspirin, statin   ?  ?TBI: chronic  ?  ?Acute metabolic encephalopathy: at baseline. Resolved  ?  ?NSTEMI: completed IV heparin x 48hrs. Continue on aspirin, statin, metoprolol. Invasive evaluation deferred as per cardio  ?  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 Becoming very agitated and aggressive at times.  Ativan prn. Pursuing placement which is complicated. TOC and leadership involved in challenging disposition ?  ?Acute pulmonary edema: resolved as of 01/31/2022 ?  ?  ?DVT prophylaxis: SCD/Compression stockings  ?Code Status: dnr ? ? ?  ?Code Status Orders  ?(From admission, onward)  ?  ? ? ?  ? ?  Start     Ordered  ? 11/20/21 2032  Do not attempt resuscitation (DNR)  Continuous       ?Question Answer Comment  ?In the event of cardiac or respiratory ARREST Do not call a ?code blue?   ?In the event of cardiac or respiratory ARREST Do not perform Intubation, CPR, defibrillation or ACLS   ?In the event of cardiac or respiratory ARREST Use medication by any route, position, wound care, and other measures to relive pain and suffering. May use oxygen, suction and manual treatment of airway obstruction as needed for comfort.   ?Comments EDP verified DNR/DNI status with pt's mother   ?  ? 11/20/21 2031  ? ?  ?  ? ?  ? ?Code Status History   ? ? Date Active Date Inactive Code Status Order ID Comments User Context  ? 07/20/2021 2026 11/20/2021 2032 Full Code [id number]  Vladimir Crofts, MD ED  ? ?  ? ?Family Communication: none today  ?Disposition Plan:   no safe d/c plan ?Consults called: None ?Admission status: Inpatient ? ? ?Consultants:  ?psychiatry ? ?Procedures:  ?DG Chest 2 View ? ?Result Date: 02/18/2022 ?CLINICAL DATA:  Fever and cough. EXAM: CHEST - 2 VIEW COMPARISON:  Chest x-ray dated November 20, 2021. FINDINGS: Unchanged cardiomegaly. Improved pulmonary vascular congestion compared to the prior study. New patchy asymmetric opacities in the right lower lobe with trace right pleural effusion. No  pneumothorax. No acute osseous abnormality. IMPRESSION: 1. Right lower lobe pneumonia. Electronically Signed   By: Titus Dubin M.D.   On: 02/18/2022 10:29  ? ?DG Abd 1 View ? ?Result Date: 02/19/2022 ?CLINICAL DATA:  Fever EXAM: ABDOMEN - 1 VIEW COMPARISON:  None. FINDINGS: The bowel gas pattern is normal. No radio-opaque calculi or other significant radiographic abnormality are seen. IMPRESSION: Negative. Electronically Signed   By: Davina Poke D.O.   On: 02/19/2022 15:10  ? ?DG Chest Port 1 View ? ?Result Date: 03/07/2022 ?CLINICAL DATA:  Cough. EXAM: PORTABLE CHEST 1 VIEW COMPARISON:  Chest x-ray from today at 0625 hours. FINDINGS: The patient is rotated to the left, limiting evaluation. Stable cardiomegaly. No focal consolidation, pleural effusion, or pneumothorax. No acute osseous abnormality. IMPRESSION: No active disease. Electronically Signed   By: Titus Dubin M.D.   On: 03/07/2022 15:56  ? ?DG Chest Port 1 View ? ?Result Date: 03/07/2022 ?CLINICAL DATA:  Tachycardia. EXAM: PORTABLE CHEST 1 VIEW COMPARISON:  Chest x-ray dated February 18, 2022. FINDINGS: The patient is rotated  to the right, accentuating the upper mediastinal contours. Stable cardiomegaly. Chronic mild pulmonary vascular congestion. Resolved right lower lobe pneumonia. No focal consolidation, pleural effusion, or pneumothorax. No acute osseous abnormality. IMPRESSION: 1. No active disease. Resolved right lower lobe pneumonia. Electronically Signed   By: Titus Dubin M.D.   On: 03/07/2022 08:03   ? ? ?Subjective: ?Patient has no new concerns today. ? ?Objective: ?Vitals:  ? 03/13/22 1920 03/14/22 0658 03/15/22 0748 03/15/22 1025  ?BP: 113/90 107/82 112/76 134/89  ?Pulse: (!) 104 74 88 (!) 109  ?Resp:  16 17 17   ?Temp: 97.6 ?F (36.4 ?C)  97.6 ?F (36.4 ?C) 97.7 ?F (36.5 ?C)  ?TempSrc: Oral  Oral Oral  ?SpO2: 99% 99% 100% 97%  ?Weight:      ?Height:      ? ?No intake or output data in the 24 hours ending 03/15/22 1344 ? ?Filed Weights  ?  07/20/21 1635 12/07/21 2025 02/16/22 0229  ?Weight: 79.4 kg 80 kg 85.8 kg  ? ? ?Examination: ? ?General.     In no acute distress. ?Pulmonary.  Lungs clear bilaterally, normal respiratory effort. ?CV.  Regular ra

## 2022-03-15 NOTE — Progress Notes (Signed)
Pharmacy Consult - Clozapine   ?  ?61 yo male ordered clozapine 300 mg PO qHS  ?  ?Clozapine REMS enrollment Verified: yes ?Clozapine REMS enrollment: 01/04/22  ?REMS patient ID: [id number] ?  ?Last dose: 4/11 @ 2121 - HELD 4/12 due to drop in ANC ?  ?Dose Adjustments This Admission:   ?Decrease dose from 375mg  to 300mg  on  4/10 ?  ? ?Dose Adjustments This Admission: ?2/3 started on clozapine 25mg   ?2/10 clozapine 100 mg  ?2/20 clozapine 175 mg ?2/22 clozapine 200 mg  ?3/06 clozapine 250 mg ?3/08 clozapine 300 mg ?3/17 clozapine 350 mg ?4/10 clozapine 300 mg ?  ?Labs: ?Date: 01/04/22 ANC: 2800   Submitted: 01/04/22 ?Date: 01/14/22 ANC: 2700   Submitted: 01/14/22 ?Date: 01/22/22 ANC: 4000   Submitted: 01/22/22 ?Date: 01/29/22 ANC: 2500   Submitted: 01/29/22 ?Date: 02/05/22 ANC: 2300   Submitted: 02/05/22 ?Date: 02/13/22 ANC: 2800   Submitted: 02/07/22 ?Date: 02/21/22 ANC: 2300   Submitted: 02/21/22 ?Date: 02/28/22 ANC: 4800 ?Date: 03/07/22 ANC: 7900 ?Date: 03/14/22 ANC: 1700   Submitted as 1200 in error ?Date: 03/15/22 ANC: 1800   Submitted: 03/15/22 ?  ?  ?Plan: ?Resume clozapine 300mg  qHS as previously ordered by Dr 05/14/22 ?Monitor ANC 3x/week for 1 week due to significant drop in ANC ? ?**CLOZAPINE MONITORING** ?(reflects NEW REMS GUIDELINES EFFECTIVE 09/13/2014): ?Check ANC at least weekly while inpatient. ? ?For general population patients, i.e., those without benign ethnic neutropenia (BEN): ?--If ANC 1000-1499, increase ANC monitoring to 3x/wk ?--If ANC < 1000, HOLD CLOZAPINE and get psych consult ? ? ?

## 2022-03-16 DIAGNOSIS — F209 Schizophrenia, unspecified: Secondary | ICD-10-CM | POA: Diagnosis not present

## 2022-03-16 NOTE — TOC Progression Note (Signed)
Transition of Care (TOC) - Progression Note  ? ? ?Patient Details  ?Name: Adam [name] ?MRN: [id number] ?Date of Birth: 1961-11-03 ? ?Transition of Care (TOC) CM/SW Contact  ?Caryn Section, RN ?Phone Number: ?03/16/2022, 3:48 PM ? ?Clinical Narrative:   Unit Director working on placement for Monday. ? ? ? ?Expected Discharge Plan: Group Home ?Barriers to Discharge: Continued Medical Work up ? ?Expected Discharge Plan and Services ?Expected Discharge Plan: Group Home ?In-house Referral: Clinical Social Work ?  ?  ?Living arrangements for the past 2 months: Group Home ?                ?  ?  ?  ?  ?  ?  ?  ?  ?  ?  ? ? ?Social Determinants of Health (SDOH) Interventions ?  ? ?Readmission Risk Interventions ?   ? View : No data to display.  ?  ?  ?  ? ? ?

## 2022-03-16 NOTE — Progress Notes (Signed)
?PROGRESS NOTE ? ? ? ?Adam [name]  CWU:[id number] DOB: 12-13-1960 DOA: 07/20/2021 ?PCP: Pcp, No  ? ?Brief Narrative:  ?61 yo WM with hx of schizophrenia and TBI, who was initially admitted to [facility] ER on July 20, 2021. He spent more than 123 days in the [facility] ED awaiting psychiatric placement.  He was initially brought to [facility] ER due to violent outbursts at his group home.  He was IVC'd initially.  He was noted to be lethargic 12/19, found to have NSTEMI.  Pt was admitted to hospitalist service.  Cardiology consulted who recommended no intervention.   ? After admission to the medical service patient became severely agitated on the floor.  Pt required to ICU night of 12/24 for precedex gtt due to severe agitation and combative behavior.  PCCM was consulted.  Precedex felt to be ineffective and was turned off.  Pt was transferred back to floor on 1/4.  Hospital course remarkable for agitation, violent outbursts. Psychiatry was consulted,making some medication changes.  They do not accept pt to inpatient psych here and recommend continued search for longer term placement.  Senior management involved.  Plan for placement on a group home. ? Patient has been here 200 +  days so  far.  He is difficult to place. ? ?4/5: After staying stable for many days, last night patient again becoming agitated, voiding on himself and not letting any staff to come close to clean.  There was significant urine smell in the room.  There was also some nursing concern that he was choking last night.  Chest x-ray and UA ordered. ?Patient needs to be evaluated by psych and is appropriate for behavioral health admission instead of staying on medical floor.  He has shown this type of behavior many time during current prolonged hospitalization requiring psych intervention. ? ?4/6: UA with pyuria, most likely having increased urinary frequency and urgency causing incontinence.  Cultures were sent and he was started on Bactrim.  Remained  afebrile. ? ?4/7: Urine culture with multiple species.  Patient is already on Bactrim and we will complete a 5-day course as clinically seems improving.  Urinary urgency resolved. ?Completed the course of an antibiotic. ? ?4/14: Find out today that he might be going to behavioral health on Monday. ? ?Assessment & Plan: ?  ?Principal Problem: ?  Schizophrenia, chronic condition with acute exacerbation ([facility]) ?Active Problems: ?  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  DNR (do not resuscitate)/DNI(Do Not Intubate) ?  NSTEMI (non-ST elevated myocardial infarction) ([facility]) ?  Acute metabolic encephalopathy ?  TBI (traumatic brain injury) ([facility]) ?  Stroke [facility]) ?  Elevated CK ?  Constipation ?  Aspiration pneumonia ([facility]) ?  Fall ?  UTI (urinary tract infection) ? ?UTI.  Most likely having increased frequency and urgency causing incontinence.  Could not tell any symptoms.  Remained afebrile.  UA with pyuria. ?-Urine cultures with multiple species ?-Completed the course of Bactrim. ? ?Change in behavior.  Most likely had UTI causing urinary urgency and frequency which has been resolved now.  Currently behavior at his baseline. ? ?Schizophrenia: chronic condition with acute exacerbation. Psychiatry followed periodically here due to ongoing safety issues related to patient's behavioral disturbances. Continue on ativan as per psych.  Psychiatry reviewed recent clinical status, elevated clonazepam levels so psychiatry decrease the dose to 300 mg at night. ?  ?Fall: on 3/24, noted to have fall w/o LOC while ambulating with staff. Noted to have fallen on L knee.  Pt reportedly had no subsequent knee pain and was able to ambulate without difficulty ?  ?Aspiration pneumonia: completed abx course. High risk for recurrent aspiration  ?  ?Constipation: continue on milk of mg as needed. ?  ?Elevated CK: likely secondary to IM injections as per psych.  No CKs checked since 2/20 when it was 1629.  Reviewed  CMP and CK was 77. ?  ?Hx of CVA: significant aphasia. Continue on aspirin, statin  ?  ?TBI: chronic  ?  ?Acute metabolic encephalopathy: at baseline. Resolved  ?  ?NSTEMI: completed IV heparin x 48hrs. Continue on aspirin, statin, metoprolol. Invasive evaluation deferred as per cardio  ?  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 Becoming very agitated and aggressive at times.  Ativan prn. Pursuing placement which is complicated. TOC and leadership involved in challenging disposition ?  ?Acute pulmonary edema: resolved as of 01/31/2022 ?  ?  ?DVT prophylaxis: SCD/Compression stockings  ?Code Status: dnr ? ? ?  ?Code Status Orders  ?(From admission, onward)  ?  ? ? ?  ? ?  Start     Ordered  ? 11/20/21 2032  Do not attempt resuscitation (DNR)  Continuous       ?Question Answer Comment  ?In the event of cardiac or respiratory ARREST Do not call a ?code blue?   ?In the event of cardiac or respiratory ARREST Do not perform Intubation, CPR, defibrillation or ACLS   ?In the event of cardiac or respiratory ARREST Use medication by any route, position, wound care, and other measures to relive pain and suffering. May use oxygen, suction and manual treatment of airway obstruction as needed for comfort.   ?Comments EDP verified DNR/DNI status with pt's mother   ?  ? 11/20/21 2031  ? ?  ?  ? ?  ? ?Code Status History   ? ? Date Active Date Inactive Code Status Order ID Comments User Context  ? 07/20/2021 2026 11/20/2021 2032 Full Code [id number]  Delton Prairie, MD ED  ? ?  ? ?Family Communication: none today  ?Disposition Plan:   no safe d/c plan ?Consults called: None ?Admission status: Inpatient ? ? ?Consultants:  ?psychiatry ? ?Procedures:  ?DG Chest 2 View ? ?Result Date: 02/18/2022 ?CLINICAL DATA:  Fever and cough. EXAM: CHEST - 2 VIEW COMPARISON:  Chest x-ray dated November 20, 2021. FINDINGS: Unchanged cardiomegaly. Improved pulmonary vascular congestion compared to the prior study. New patchy  asymmetric opacities in the right lower lobe with trace right pleural effusion. No pneumothorax. No acute osseous abnormality. IMPRESSION: 1. Right lower lobe pneumonia. Electronically Signed   By: Obie Dredge M.D.   On: 02/18/2022 10:29  ? ?DG Abd 1 View ? ?Result Date: 02/19/2022 ?CLINICAL DATA:  Fever EXAM: ABDOMEN - 1 VIEW COMPARISON:  None. FINDINGS: The bowel gas pattern is normal. No radio-opaque calculi or other significant radiographic abnormality are seen. IMPRESSION: Negative. Electronically Signed   By: Duanne Guess D.O.   On: 02/19/2022 15:10  ? ?DG Chest Port 1 View ? ?Result Date: 03/07/2022 ?CLINICAL DATA:  Cough. EXAM: PORTABLE CHEST 1 VIEW COMPARISON:  Chest x-ray from today at 0625 hours. FINDINGS: The patient is rotated to the left, limiting evaluation. Stable cardiomegaly. No focal consolidation, pleural effusion, or pneumothorax. No acute osseous abnormality. IMPRESSION: No active disease. Electronically Signed   By: Obie Dredge M.D.   On: 03/07/2022 15:56  ? ?DG Chest Port 1 View ? ?Result Date: 03/07/2022 ?CLINICAL DATA:  Tachycardia. EXAM: PORTABLE  CHEST 1 VIEW COMPARISON:  Chest x-ray dated February 18, 2022. FINDINGS: The patient is rotated to the right, accentuating the upper mediastinal contours. Stable cardiomegaly. Chronic mild pulmonary vascular congestion. Resolved right lower lobe pneumonia. No focal consolidation, pleural effusion, or pneumothorax. No acute osseous abnormality. IMPRESSION: 1. No active disease. Resolved right lower lobe pneumonia. Electronically Signed   By: Obie Dredge M.D.   On: 03/07/2022 08:03   ? ? ?Subjective: ?Patient was sleeping comfortably when seen today.  No acute concern ? ?Objective: ?Vitals:  ? 03/14/22 0658 03/15/22 0748 03/15/22 1025 03/16/22 1224  ?BP: 107/82 112/76 134/89 107/80  ?Pulse: 74 88 (!) 109 (!) 102  ?Resp: 16 17 17    ?Temp:  97.6 ?F (36.4 ?C) 97.7 ?F (36.5 ?C) (!) 97.5 ?F (36.4 ?C)  ?TempSrc:  Oral Oral Oral  ?SpO2: 99% 100%  97% 100%  ?Weight:      ?Height:      ? ?No intake or output data in the 24 hours ending 03/16/22 1622 ? ?Filed Weights  ? 07/20/21 1635 12/07/21 2025 02/16/22 0229  ?Weight: 79.4 kg 80 kg 85.8 kg  ? ? ?Examination:

## 2022-03-17 DIAGNOSIS — N390 Urinary tract infection, site not specified: Secondary | ICD-10-CM

## 2022-03-17 DIAGNOSIS — F209 Schizophrenia, unspecified: Secondary | ICD-10-CM | POA: Diagnosis not present

## 2022-03-17 DIAGNOSIS — K5900 Constipation, unspecified: Secondary | ICD-10-CM | POA: Diagnosis not present

## 2022-03-17 DIAGNOSIS — J690 Pneumonitis due to inhalation of food and vomit: Secondary | ICD-10-CM | POA: Diagnosis not present

## 2022-03-17 DIAGNOSIS — G3189 Other specified degenerative diseases of nervous system: Secondary | ICD-10-CM | POA: Diagnosis not present

## 2022-03-17 NOTE — Progress Notes (Signed)
?   03/17/22 1212  ?Assess: MEWS Score  ?Temp 97.8 ?F (36.6 ?C)  ?BP 118/77  ?Pulse Rate (!) 113  ?Resp 16  ?SpO2 99 %  ?O2 Device Room Air  ?Assess: MEWS Score  ?MEWS Temp 0  ?MEWS Systolic 0  ?MEWS Pulse 2  ?MEWS RR 0  ?MEWS LOC 0  ?MEWS Score 2  ?MEWS Score Color Yellow  ?Assess: if the MEWS score is Yellow or Red  ?Were vital signs taken at a resting state? Yes  ?Focused Assessment No change from prior assessment  ?Does the patient meet 2 or more of the SIRS criteria? No  ?Does the patient have a confirmed or suspected source of infection? No  ?Provider and Rapid Response Notified? No  ?MEWS guidelines implemented *See Row Information* Yes  ?Take Vital Signs  ?Increase Vital Sign Frequency  Yellow: Q 2hr X 2 then Q 4hr X 2, if remains yellow, continue Q 4hrs  ?Escalate  ?MEWS: Escalate Yellow: discuss with charge nurse/RN and consider discussing with provider and RRT  ?Notify: Charge Nurse/RN  ?Name of Charge Nurse/RN Notified Robin RN  ?Date Charge Nurse/RN Notified 03/17/22  ?Time Charge Nurse/RN Notified 1216  ?Document  ?Progress note created (see row info) Yes  ?Assess: SIRS CRITERIA  ?SIRS Temperature  0  ?SIRS Pulse 1  ?SIRS Respirations  0  ?SIRS WBC 0  ?SIRS Score Sum  1  ? ? ?

## 2022-03-17 NOTE — Progress Notes (Signed)
?PROGRESS NOTE ? ? ? ?Adam [name]  KGS:[id number] DOB: 11-16-1961 DOA: 07/20/2021 ?PCP: Pcp, No  ? ?Brief Narrative:  ?LONG HOSPITAL STAY DUE TO PLACEMENT ? ?61 yo WM with hx of schizophrenia and TBI, who was initially admitted to [facility] ER on July 20, 2021. He spent more than 123 days in the [facility] ED awaiting psychiatric placement.  He was initially brought to [facility] ER due to violent outbursts at his group home.  He was IVC'd initially.  He was noted to be lethargic 12/19, found to have NSTEMI.  Pt was admitted to hospitalist service.  Cardiology consulted who recommended no intervention.   ? After admission to the medical service patient became severely agitated on the floor.  Pt required to ICU night of 12/24 for precedex gtt due to severe agitation and combative behavior.  PCCM was consulted.  Precedex felt to be ineffective and was turned off.  Pt was transferred back to floor on 1/4.  Hospital course remarkable for agitation, violent outbursts. Psychiatry was consulted,making some medication changes.  They do not accept pt to inpatient psych here and recommend continued search for longer term placement.  Senior management involved.  Plan for placement on a group home. ? Patient has been here 200 +  days so  far.  He is difficult to place. ?  ?4/5: After staying stable for many days, last night patient again becoming agitated, voiding on himself and not letting any staff to come close to clean.  There was significant urine smell in the room.  There was also some nursing concern that he was choking last night.  Chest x-ray and UA ordered. ?Patient needs to be evaluated by psych and is appropriate for behavioral health admission instead of staying on medical floor.  He has shown this type of behavior many time during current prolonged hospitalization requiring psych intervention. ?  ?4/6: UA with pyuria, most likely having increased urinary frequency and urgency causing incontinence.  Cultures were sent and he was  started on Bactrim.  Remained afebrile. ?  ?4/7: Urine culture with multiple species.  Patient is already on Bactrim and we will complete a 5-day course as clinically seems improving.  Urinary urgency resolved. ?Completed the course of an antibiotic. ?  ?4/14: Find out today that he might be going to behavioral health on Monday. ? ? ?Assessment & Plan: ?  ?Principal Problem: ?  Schizophrenia, chronic condition with acute exacerbation (HCC) ?Active Problems: ?  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  DNR (do not resuscitate)/DNI(Do Not Intubate) ?  NSTEMI (non-ST elevated myocardial infarction) (HCC) ?  Acute metabolic encephalopathy ?  TBI (traumatic brain injury) (HCC) ?  Stroke [facility]) ?  Elevated CK ?  Constipation ?  Aspiration pneumonia (HCC) ?  Fall ?  UTI (urinary tract infection) ? ? ?  ?UTI.  Most likely having increased frequency and urgency causing incontinence.  Could not tell any symptoms.  Remained afebrile.  UA with pyuria. ?-Urine cultures with multiple species ?-Completed the course of Bactrim. ?  ?Change in behavior.  Most likely had UTI causing urinary urgency and frequency which has been resolved now.  Currently behavior at his baseline. ?  ?Schizophrenia: chronic condition with acute exacerbation. Psychiatry followed periodically here due to ongoing safety issues related to patient's behavioral disturbances. Continue on ativan as per psych.  Psychiatry reviewed recent clinical status, elevated clonazepam levels so psychiatry decrease the dose to 300 mg at night. ?  ?Fall: on 3/24,  noted to have fall w/o LOC while ambulating with staff. Noted to have fallen on L knee. Pt reportedly had no subsequent knee pain and was able to ambulate without difficulty ?  ?Aspiration pneumonia: completed abx course. High risk for recurrent aspiration  ?  ?Constipation: continue on milk of mg as needed. ?  ?Elevated CK: likely secondary to IM injections as per psych.  No CKs checked  since 2/20 when it was 1629.  Reviewed CMP and CK was 77. ?  ?Hx of CVA: significant aphasia. Continue on aspirin, statin  ?  ?TBI: chronic  ?  ?Acute metabolic encephalopathy: at baseline. Resolved  ?  ?NSTEMI: completed IV heparin x 48hrs. Continue on aspirin, statin, metoprolol. Invasive evaluation deferred as per cardio  ?  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 Becoming very agitated and aggressive at times.  Ativan prn. Pursuing placement which is complicated. TOC and leadership involved in challenging disposition ?  ?Acute pulmonary edema: resolved as of 01/31/2022 ?  ?  ?DVT prophylaxis: SCD/Compression stockings  ?Code Status: dnr ? ? ?  ?Code Status Orders  ?(From admission, onward)  ?  ? ? ?  ? ?  Start     Ordered  ? 11/20/21 2032  Do not attempt resuscitation (DNR)  Continuous       ?Question Answer Comment  ?In the event of cardiac or respiratory ARREST Do not call a ?code blue?   ?In the event of cardiac or respiratory ARREST Do not perform Intubation, CPR, defibrillation or ACLS   ?In the event of cardiac or respiratory ARREST Use medication by any route, position, wound care, and other measures to relive pain and suffering. May use oxygen, suction and manual treatment of airway obstruction as needed for comfort.   ?Comments EDP verified DNR/DNI status with pt's mother   ?  ? 11/20/21 2031  ? ?  ?  ? ?  ? ?Code Status History   ? ? Date Active Date Inactive Code Status Order ID Comments User Context  ? 07/20/2021 2026 11/20/2021 2032 Full Code [id number]  Delton Prairie, MD ED  ? ?  ? ?Family Communication: NONE TODAY  ?Disposition Plan:   NO SAFE D/C PLAN ?Consults called: None ?Admission status: Inpatient ? ? ?Consultants:  ?PSYCHIATRY ? ?Procedures:  ?DG Chest 2 View ? ?Result Date: 02/18/2022 ?CLINICAL DATA:  Fever and cough. EXAM: CHEST - 2 VIEW COMPARISON:  Chest x-ray dated November 20, 2021. FINDINGS: Unchanged cardiomegaly. Improved pulmonary vascular congestion  compared to the prior study. New patchy asymmetric opacities in the right lower lobe with trace right pleural effusion. No pneumothorax. No acute osseous abnormality. IMPRESSION: 1. Right lower lobe pneumonia. Electronically Signed   By: Obie Dredge M.D.   On: 02/18/2022 10:29  ? ?DG Abd 1 View ? ?Result Date: 02/19/2022 ?CLINICAL DATA:  Fever EXAM: ABDOMEN - 1 VIEW COMPARISON:  None. FINDINGS: The bowel gas pattern is normal. No radio-opaque calculi or other significant radiographic abnormality are seen. IMPRESSION: Negative. Electronically Signed   By: Duanne Guess D.O.   On: 02/19/2022 15:10  ? ?DG Chest Port 1 View ? ?Result Date: 03/07/2022 ?CLINICAL DATA:  Cough. EXAM: PORTABLE CHEST 1 VIEW COMPARISON:  Chest x-ray from today at 0625 hours. FINDINGS: The patient is rotated to the left, limiting evaluation. Stable cardiomegaly. No focal consolidation, pleural effusion, or pneumothorax. No acute osseous abnormality. IMPRESSION: No active disease. Electronically Signed   By: Obie Dredge M.D.   On: 03/07/2022 15:56  ? ?  DG Chest Port 1 View ? ?Result Date: 03/07/2022 ?CLINICAL DATA:  Tachycardia. EXAM: PORTABLE CHEST 1 VIEW COMPARISON:  Chest x-ray dated February 18, 2022. FINDINGS: The patient is rotated to the right, accentuating the upper mediastinal contours. Stable cardiomegaly. Chronic mild pulmonary vascular congestion. Resolved right lower lobe pneumonia. No focal consolidation, pleural effusion, or pneumothorax. No acute osseous abnormality. IMPRESSION: 1. No active disease. Resolved right lower lobe pneumonia. Electronically Signed   By: Obie Dredge M.D.   On: 03/07/2022 08:03   ? ? ? ? ?Subjective: ?Was sleeping initially ?Saw pt in afternoon, stable happy eating breakfast ? ?Objective: ?Vitals:  ? 03/16/22 2016 03/17/22 1027 03/17/22 1152 03/17/22 1212  ?BP: 105/86 (!) 127/95 109/90 118/77  ?Pulse: 98 99 (!) 116 (!) 113  ?Resp:  16 14   ?Temp: 98 ?F (36.7 ?C)  97.6 ?F (36.4 ?C)   ?TempSrc: Oral      ?SpO2: 99% 99% 99%   ?Weight:      ?Height:      ? ?No intake or output data in the 24 hours ending 03/17/22 1438 ?Filed Weights  ? 07/20/21 1635 12/07/21 2025 02/16/22 0229  ?Weight: 79.4 kg 80 kg

## 2022-03-18 DIAGNOSIS — J690 Pneumonitis due to inhalation of food and vomit: Secondary | ICD-10-CM | POA: Diagnosis not present

## 2022-03-18 DIAGNOSIS — G3189 Other specified degenerative diseases of nervous system: Secondary | ICD-10-CM | POA: Diagnosis not present

## 2022-03-18 DIAGNOSIS — F209 Schizophrenia, unspecified: Secondary | ICD-10-CM | POA: Diagnosis not present

## 2022-03-18 DIAGNOSIS — K5900 Constipation, unspecified: Secondary | ICD-10-CM | POA: Diagnosis not present

## 2022-03-18 NOTE — Progress Notes (Signed)
?PROGRESS NOTE ? ? ? ?Adam [name]  RSW:[id number] DOB: Jan 18, 1961 DOA: 07/20/2021 ?PCP: Pcp, No  ? ?Brief Narrative:  ?LONG HOSPITAL STAY DUE TO PLACEMENT ?  ?61 yo WM with hx of schizophrenia and TBI, who was initially admitted to [facility] ER on July 20, 2021. He spent more than 123 days in the [facility] ED awaiting psychiatric placement.  He was initially brought to [facility] ER due to violent outbursts at his group home.  He was IVC'd initially.  He was noted to be lethargic 12/19, found to have NSTEMI.  Pt was admitted to hospitalist service.  Cardiology consulted who recommended no intervention.   ? After admission to the medical service patient became severely agitated on the floor.  Pt required to ICU night of 12/24 for precedex gtt due to severe agitation and combative behavior.  PCCM was consulted.  Precedex felt to be ineffective and was turned off.  Pt was transferred back to floor on 1/4.  Hospital course remarkable for agitation, violent outbursts. Psychiatry was consulted,making some medication changes.  They do not accept pt to inpatient psych here and recommend continued search for longer term placement.  Senior management involved.  Plan for placement on a group home. ? Patient has been here 200 +  days so  far.  He is difficult to place. ?  ?4/5: After staying stable for many days, last night patient again becoming agitated, voiding on himself and not letting any staff to come close to clean.  There was significant urine smell in the room.  There was also some nursing concern that he was choking last night.  Chest x-ray and UA ordered. ?Patient needs to be evaluated by psych and is appropriate for behavioral health admission instead of staying on medical floor.  He has shown this type of behavior many time during current prolonged hospitalization requiring psych intervention. ?  ?4/6: UA with pyuria, most likely having increased urinary frequency and urgency causing incontinence.  Cultures were sent and he was  started on Bactrim.  Remained afebrile. ?  ?4/7: Urine culture with multiple species.  Patient is already on Bactrim and we will complete a 5-day course as clinically seems improving.  Urinary urgency resolved. ?Completed the course of an antibiotic. ?  ?4/14: Find out today that he might be going to behavioral health on Monday. ? ? ?Assessment & Plan: ?  ?Principal Problem: ?  Schizophrenia, chronic condition with acute exacerbation (HCC) ?Active Problems: ?  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  DNR (do not resuscitate)/DNI(Do Not Intubate) ?  NSTEMI (non-ST elevated myocardial infarction) (HCC) ?  Acute metabolic encephalopathy ?  TBI (traumatic brain injury) (HCC) ?  Stroke [facility]) ?  Elevated CK ?  Constipation ?  Aspiration pneumonia (HCC) ?  Fall ?  UTI (urinary tract infection) ? ? ?UTI.  Most likely having increased frequency and urgency causing incontinence.  Could not tell any symptoms.  Remained afebrile.  UA with pyuria. ?-Urine cultures with multiple species ?-Completed the course of Bactrim. ?  ?Change in behavior.  Most likely had UTI causing urinary urgency and frequency which has been resolved now.  Currently behavior at his baseline. ?  ?Schizophrenia: chronic condition with acute exacerbation. Psychiatry followed periodically here due to ongoing safety issues related to patient's behavioral disturbances. Continue on ativan as per psych.  Psychiatry reviewed recent clinical status, elevated clonazepam levels so psychiatry decrease the dose to 300 mg at night. ?  ?Fall: on 3/24, noted  to have fall w/o LOC while ambulating with staff. Noted to have fallen on L knee. Pt reportedly had no subsequent knee pain and was able to ambulate without difficulty ?  ?Aspiration pneumonia: completed abx course. High risk for recurrent aspiration  ?  ?Constipation: continue on milk of mg as needed. ?  ?Elevated CK: likely secondary to IM injections as per psych.  No CKs checked  since 2/20 when it was 1629.  Reviewed CMP and CK was 77. ?  ?Hx of CVA: significant aphasia. Continue on aspirin, statin  ?  ?TBI: chronic  ?  ?Acute metabolic encephalopathy: at baseline. Resolved  ?  ?NSTEMI: completed IV heparin x 48hrs. Continue on aspirin, statin, metoprolol. Invasive evaluation deferred as per cardio  ?  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 Becoming very agitated and aggressive at times.  Ativan prn. Pursuing placement which is complicated. TOC and leadership involved in challenging disposition ?  ?Acute pulmonary edema: resolved as of 01/31/2022 ?  ?  ?DVT prophylaxis: SCD/Compression stockings  ?Code Status: dnr ? ? ?  ?Code Status Orders  ?(From admission, onward)  ?  ? ? ?  ? ?  Start     Ordered  ? 11/20/21 2032  Do not attempt resuscitation (DNR)  Continuous       ?Question Answer Comment  ?In the event of cardiac or respiratory ARREST Do not call a ?code blue?   ?In the event of cardiac or respiratory ARREST Do not perform Intubation, CPR, defibrillation or ACLS   ?In the event of cardiac or respiratory ARREST Use medication by any route, position, wound care, and other measures to relive pain and suffering. May use oxygen, suction and manual treatment of airway obstruction as needed for comfort.   ?Comments EDP verified DNR/DNI status with pt's mother   ?  ? 11/20/21 2031  ? ?  ?  ? ?  ? ?Code Status History   ? ? Date Active Date Inactive Code Status Order ID Comments User Context  ? 07/20/2021 2026 11/20/2021 2032 Full Code [id number]  Delton Prairie, MD ED  ? ?  ? ?Family Communication: none today  ?Disposition Plan:    No safe discharge plan get established ?Consults called: None ?Admission status: Inpatient ? ? ?Consultants:  ?Psychiatry ? ?Procedures:  ?DG Chest 2 View ? ?Result Date: 02/18/2022 ?CLINICAL DATA:  Fever and cough. EXAM: CHEST - 2 VIEW COMPARISON:  Chest x-ray dated November 20, 2021. FINDINGS: Unchanged cardiomegaly. Improved  pulmonary vascular congestion compared to the prior study. New patchy asymmetric opacities in the right lower lobe with trace right pleural effusion. No pneumothorax. No acute osseous abnormality. IMPRESSION: 1. Right lower lobe pneumonia. Electronically Signed   By: Obie Dredge M.D.   On: 02/18/2022 10:29  ? ?DG Abd 1 View ? ?Result Date: 02/19/2022 ?CLINICAL DATA:  Fever EXAM: ABDOMEN - 1 VIEW COMPARISON:  None. FINDINGS: The bowel gas pattern is normal. No radio-opaque calculi or other significant radiographic abnormality are seen. IMPRESSION: Negative. Electronically Signed   By: Duanne Guess D.O.   On: 02/19/2022 15:10  ? ?DG Chest Port 1 View ? ?Result Date: 03/07/2022 ?CLINICAL DATA:  Cough. EXAM: PORTABLE CHEST 1 VIEW COMPARISON:  Chest x-ray from today at 0625 hours. FINDINGS: The patient is rotated to the left, limiting evaluation. Stable cardiomegaly. No focal consolidation, pleural effusion, or pneumothorax. No acute osseous abnormality. IMPRESSION: No active disease. Electronically Signed   By: Obie Dredge M.D.   On:  03/07/2022 15:56  ? ?DG Chest Port 1 View ? ?Result Date: 03/07/2022 ?CLINICAL DATA:  Tachycardia. EXAM: PORTABLE CHEST 1 VIEW COMPARISON:  Chest x-ray dated February 18, 2022. FINDINGS: The patient is rotated to the right, accentuating the upper mediastinal contours. Stable cardiomegaly. Chronic mild pulmonary vascular congestion. Resolved right lower lobe pneumonia. No focal consolidation, pleural effusion, or pneumothorax. No acute osseous abnormality. IMPRESSION: 1. No active disease. Resolved right lower lobe pneumonia. Electronically Signed   By: Obie Dredge M.D.   On: 03/07/2022 08:03   ? ? ? ? ?Subjective: ?Patient resting on edge of bed this morning ?Less interactive but cooperative ? ?Objective: ?Vitals:  ? 03/17/22 1212 03/17/22 1726 03/17/22 2057 03/18/22 1049  ?BP: 118/77 117/82 125/83 (!) 149/88  ?Pulse: (!) 113 95 (!) 102 96  ?Resp: 16 16 18    ?Temp: 97.8 ?F (36.6 ?C)  97.7 ?F (36.5 ?C) 98.1 ?F (36.7 ?C)   ?TempSrc: Oral Oral Oral   ?SpO2: 99% 99% 99% 98%  ?Weight:      ?Height:      ? ?No intake or output data in the 24 hours ending 03/18/22 1407 ?Filed Weights  ? 07/20/21 1

## 2022-03-19 ENCOUNTER — Other Ambulatory Visit: Payer: Self-pay

## 2022-03-19 ENCOUNTER — Encounter: Payer: Self-pay | Admitting: Psychiatry

## 2022-03-19 ENCOUNTER — Inpatient Hospital Stay
Admission: AD | Admit: 2022-03-19 | Discharge: 2023-02-19 | DRG: 885 | Disposition: A | Discharge status: Home / Self Care | Payer: Medicare HMO | Source: Intra-hospital | Attending: Psychiatry | Admitting: Psychiatry

## 2022-03-19 DIAGNOSIS — Z79899 Other long term (current) drug therapy: Secondary | ICD-10-CM | POA: Diagnosis not present

## 2022-03-19 DIAGNOSIS — F918 Other conduct disorders: Secondary | ICD-10-CM | POA: Diagnosis present

## 2022-03-19 DIAGNOSIS — I639 Cerebral infarction, unspecified: Secondary | ICD-10-CM | POA: Diagnosis present

## 2022-03-19 DIAGNOSIS — I110 Hypertensive heart disease with heart failure: Secondary | ICD-10-CM | POA: Diagnosis present

## 2022-03-19 DIAGNOSIS — I69319 Unspecified symptoms and signs involving cognitive functions following cerebral infarction: Secondary | ICD-10-CM | POA: Diagnosis not present

## 2022-03-19 DIAGNOSIS — Z885 Allergy status to narcotic agent status: Secondary | ICD-10-CM | POA: Diagnosis not present

## 2022-03-19 DIAGNOSIS — Z886 Allergy status to analgesic agent status: Secondary | ICD-10-CM | POA: Diagnosis not present

## 2022-03-19 DIAGNOSIS — R4189 Other symptoms and signs involving cognitive functions and awareness: Secondary | ICD-10-CM | POA: Diagnosis present

## 2022-03-19 DIAGNOSIS — F919 Conduct disorder, unspecified: Secondary | ICD-10-CM | POA: Diagnosis present

## 2022-03-19 DIAGNOSIS — R4689 Other symptoms and signs involving appearance and behavior: Secondary | ICD-10-CM | POA: Insufficient documentation

## 2022-03-19 DIAGNOSIS — Z8249 Family history of ischemic heart disease and other diseases of the circulatory system: Secondary | ICD-10-CM

## 2022-03-19 DIAGNOSIS — I69320 Aphasia following cerebral infarction: Secondary | ICD-10-CM

## 2022-03-19 DIAGNOSIS — H60392 Other infective otitis externa, left ear: Secondary | ICD-10-CM

## 2022-03-19 DIAGNOSIS — I2510 Atherosclerotic heart disease of native coronary artery without angina pectoris: Secondary | ICD-10-CM | POA: Diagnosis present

## 2022-03-19 DIAGNOSIS — Z1152 Encounter for screening for COVID-19: Secondary | ICD-10-CM

## 2022-03-19 DIAGNOSIS — I252 Old myocardial infarction: Secondary | ICD-10-CM | POA: Diagnosis not present

## 2022-03-19 DIAGNOSIS — I10 Essential (primary) hypertension: Secondary | ICD-10-CM | POA: Diagnosis present

## 2022-03-19 DIAGNOSIS — F419 Anxiety disorder, unspecified: Secondary | ICD-10-CM | POA: Diagnosis present

## 2022-03-19 DIAGNOSIS — I5022 Chronic systolic (congestive) heart failure: Secondary | ICD-10-CM | POA: Diagnosis present

## 2022-03-19 DIAGNOSIS — W19XXXS Unspecified fall, sequela: Secondary | ICD-10-CM | POA: Diagnosis not present

## 2022-03-19 DIAGNOSIS — S069X0S Unspecified intracranial injury without loss of consciousness, sequela: Secondary | ICD-10-CM | POA: Diagnosis not present

## 2022-03-19 DIAGNOSIS — E785 Hyperlipidemia, unspecified: Secondary | ICD-10-CM | POA: Diagnosis present

## 2022-03-19 DIAGNOSIS — R748 Abnormal levels of other serum enzymes: Secondary | ICD-10-CM | POA: Diagnosis not present

## 2022-03-19 DIAGNOSIS — Z818 Family history of other mental and behavioral disorders: Secondary | ICD-10-CM | POA: Diagnosis not present

## 2022-03-19 DIAGNOSIS — F639 Impulse disorder, unspecified: Secondary | ICD-10-CM | POA: Diagnosis present

## 2022-03-19 DIAGNOSIS — R454 Irritability and anger: Secondary | ICD-10-CM | POA: Diagnosis not present

## 2022-03-19 DIAGNOSIS — J690 Pneumonitis due to inhalation of food and vomit: Secondary | ICD-10-CM | POA: Diagnosis not present

## 2022-03-19 DIAGNOSIS — G4700 Insomnia, unspecified: Secondary | ICD-10-CM | POA: Diagnosis not present

## 2022-03-19 DIAGNOSIS — S069XAS Unspecified intracranial injury with loss of consciousness status unknown, sequela: Secondary | ICD-10-CM | POA: Diagnosis not present

## 2022-03-19 DIAGNOSIS — Z751 Person awaiting admission to adequate facility elsewhere: Secondary | ICD-10-CM | POA: Diagnosis not present

## 2022-03-19 DIAGNOSIS — F209 Schizophrenia, unspecified: Secondary | ICD-10-CM | POA: Diagnosis present

## 2022-03-19 DIAGNOSIS — F09 Unspecified mental disorder due to known physiological condition: Secondary | ICD-10-CM

## 2022-03-19 DIAGNOSIS — M79604 Pain in right leg: Secondary | ICD-10-CM | POA: Diagnosis not present

## 2022-03-19 LAB — CBC WITH DIFFERENTIAL/PLATELET
Abs Immature Granulocytes: 0.04 10*3/uL (ref 0.00–0.07)
Basophils Absolute: 0.1 10*3/uL (ref 0.0–0.1)
Basophils Relative: 1 %
Eosinophils Absolute: 0 10*3/uL (ref 0.0–0.5)
Eosinophils Relative: 0 %
HCT: 43.3 % (ref 39.0–52.0)
Hemoglobin: 14.3 g/dL (ref 13.0–17.0)
Immature Granulocytes: 0 %
Lymphocytes Relative: 19 %
Lymphs Abs: 1.7 10*3/uL (ref 0.7–4.0)
MCH: 30.2 pg (ref 26.0–34.0)
MCHC: 33 g/dL (ref 30.0–36.0)
MCV: 91.5 fL (ref 80.0–100.0)
Monocytes Absolute: 0.6 10*3/uL (ref 0.1–1.0)
Monocytes Relative: 7 %
Neutro Abs: 6.7 10*3/uL (ref 1.7–7.7)
Neutrophils Relative %: 73 %
Platelets: 216 10*3/uL (ref 150–400)
RBC: 4.73 MIL/uL (ref 4.22–5.81)
RDW: 13.1 % (ref 11.5–15.5)
WBC: 9.1 10*3/uL (ref 4.0–10.5)
nRBC: 0 % (ref 0.0–0.2)

## 2022-03-19 MED ORDER — ALUM & MAG HYDROXIDE-SIMETH 200-200-20 MG/5ML PO SUSP
30.0000 mL | ORAL | Status: DC | PRN
  Administered 2022-03-26 – 2022-04-26 (×4): 30 mL via ORAL
  Filled 2022-03-19 (×4): qty 30

## 2022-03-19 MED ORDER — HALOPERIDOL 5 MG PO TABS: 5.0000 mg | ORAL_TABLET | ORAL | Status: DC | PRN

## 2022-03-19 MED ORDER — CLONAZEPAM 1 MG PO TABS
1.0000 mg | ORAL_TABLET | Freq: Three times a day (TID) | ORAL | Status: DC
  Administered 2022-03-19 – 2022-04-20 (×81): 1 mg via ORAL
  Filled 2022-03-19 (×84): qty 1

## 2022-03-19 MED ORDER — DIPHENHYDRAMINE HCL 50 MG/ML IJ SOLN
50.0000 mg | Freq: Four times a day (QID) | INTRAMUSCULAR | Status: DC | PRN
  Administered 2022-09-07 – 2023-01-01 (×2): 50 mg via INTRAMUSCULAR
  Filled 2022-03-19 (×3): qty 1

## 2022-03-19 MED ORDER — GABAPENTIN 300 MG PO CAPS
300.0000 mg | ORAL_CAPSULE | Freq: Three times a day (TID) | ORAL | Status: DC
  Administered 2022-03-19 – 2022-09-21 (×522): 300 mg via ORAL
  Filled 2022-03-19 (×527): qty 1

## 2022-03-19 MED ORDER — ZIPRASIDONE MESYLATE 20 MG IM SOLR
20.0000 mg | Freq: Two times a day (BID) | INTRAMUSCULAR | Status: DC | PRN
  Administered 2022-03-19 – 2022-07-08 (×8): 20 mg via INTRAMUSCULAR
  Filled 2022-03-19 (×9): qty 20

## 2022-03-19 MED ORDER — HALOPERIDOL 5 MG PO TABS
5.0000 mg | ORAL_TABLET | Freq: Three times a day (TID) | ORAL | Status: DC
  Administered 2022-03-19 – 2022-08-25 (×444): 5 mg via ORAL
  Filled 2022-03-19 (×451): qty 1

## 2022-03-19 MED ORDER — METOPROLOL SUCCINATE ER 50 MG PO TB24
25.0000 mg | ORAL_TABLET | Freq: Every day | ORAL | Status: DC
  Administered 2022-03-19 – 2023-02-19 (×320): 25 mg via ORAL
  Filled 2022-03-19 (×159): qty 1
  Filled 2022-03-19: qty 0.5
  Filled 2022-03-19 (×180): qty 1
  Filled 2022-03-19: qty 0.5

## 2022-03-19 MED ORDER — HALOPERIDOL LACTATE 5 MG/ML IJ SOLN
5.0000 mg | Freq: Four times a day (QID) | INTRAMUSCULAR | Status: DC | PRN
  Administered 2022-09-07 – 2023-01-01 (×2): 5 mg via INTRAMUSCULAR
  Filled 2022-03-19 (×2): qty 1

## 2022-03-19 MED ORDER — PALIPERIDONE PALMITATE ER 156 MG/ML IM SUSY
156.0000 mg | PREFILLED_SYRINGE | INTRAMUSCULAR | Status: DC
  Administered 2022-03-29 – 2022-09-13 (×7): 156 mg via INTRAMUSCULAR
  Filled 2022-03-19 (×8): qty 1

## 2022-03-19 MED ORDER — BENZTROPINE MESYLATE 1 MG PO TABS
0.5000 mg | ORAL_TABLET | Freq: Two times a day (BID) | ORAL | Status: DC
  Administered 2022-03-19 – 2023-02-19 (×667): 0.5 mg via ORAL
  Filled 2022-03-19 (×673): qty 1

## 2022-03-19 MED ORDER — CLOZAPINE 100 MG PO TABS
300.0000 mg | ORAL_TABLET | Freq: Every day | ORAL | Status: DC
  Administered 2022-03-19 – 2023-02-18 (×331): 300 mg via ORAL
  Filled 2022-03-19 (×340): qty 3

## 2022-03-19 MED ORDER — SALINE SPRAY 0.65 % NA SOLN: 1.0000 | NASAL | 0 refills | Status: DC | PRN

## 2022-03-19 MED ORDER — ACETAMINOPHEN 325 MG PO TABS
650.0000 mg | ORAL_TABLET | Freq: Four times a day (QID) | ORAL | Status: DC | PRN
  Administered 2022-04-12 – 2023-02-18 (×60): 650 mg via ORAL
  Filled 2022-03-19 (×62): qty 2

## 2022-03-19 MED ORDER — TEMAZEPAM 15 MG PO CAPS: 15.0000 mg | ORAL_CAPSULE | Freq: Every day | ORAL | 0 refills | Status: DC

## 2022-03-19 MED ORDER — ATORVASTATIN CALCIUM 20 MG PO TABS: 20.0000 mg | ORAL_TABLET | Freq: Every day | ORAL | Status: DC

## 2022-03-19 MED ORDER — HALOPERIDOL 5 MG PO TABS
5.0000 mg | ORAL_TABLET | Freq: Four times a day (QID) | ORAL | Status: DC | PRN
  Administered 2022-03-22 – 2023-01-23 (×23): 5 mg via ORAL
  Filled 2022-03-19 (×22): qty 1

## 2022-03-19 MED ORDER — BENZTROPINE MESYLATE 0.5 MG PO TABS: 0.5000 mg | ORAL_TABLET | Freq: Two times a day (BID) | ORAL | Status: DC

## 2022-03-19 MED ORDER — DIPHENHYDRAMINE HCL 25 MG PO CAPS
50.0000 mg | ORAL_CAPSULE | Freq: Four times a day (QID) | ORAL | Status: DC | PRN
Start: 2022-03-19 — End: 2023-02-18
  Administered 2022-03-22 – 2023-01-28 (×35): 50 mg via ORAL
  Filled 2022-03-19 (×36): qty 2

## 2022-03-19 MED ORDER — LORAZEPAM 1 MG PO TABS: 1.0000 mg | ORAL_TABLET | ORAL | 0 refills | Status: DC | PRN

## 2022-03-19 MED ORDER — MAGNESIUM HYDROXIDE 400 MG/5ML PO SUSP
30.0000 mL | Freq: Every day | ORAL | Status: DC | PRN
  Administered 2022-04-10 – 2023-01-20 (×8): 30 mL via ORAL
  Filled 2022-03-19 (×9): qty 30

## 2022-03-19 MED ORDER — ASPIRIN 81 MG PO TBEC
81.0000 mg | DELAYED_RELEASE_TABLET | Freq: Every day | ORAL | Status: DC
  Administered 2022-03-19 – 2023-02-19 (×337): 81 mg via ORAL
  Filled 2022-03-19 (×339): qty 1

## 2022-03-19 MED ORDER — ATORVASTATIN CALCIUM 20 MG PO TABS
20.0000 mg | ORAL_TABLET | Freq: Every day | ORAL | Status: DC
  Administered 2022-03-19 – 2023-02-19 (×337): 20 mg via ORAL
  Filled 2022-03-19 (×342): qty 1

## 2022-03-19 MED ORDER — TEMAZEPAM 15 MG PO CAPS
15.0000 mg | ORAL_CAPSULE | Freq: Every day | ORAL | Status: DC
  Administered 2022-03-19 – 2023-02-18 (×329): 15 mg via ORAL
  Filled 2022-03-19 (×337): qty 1

## 2022-03-19 MED ORDER — METOPROLOL SUCCINATE ER 25 MG PO TB24: 25.0000 mg | ORAL_TABLET | Freq: Every day | ORAL | Status: DC

## 2022-03-19 MED ORDER — ALUM & MAG HYDROXIDE-SIMETH 200-200-20 MG/5ML PO SUSP: 30.0000 mL | Freq: Four times a day (QID) | ORAL | 0 refills | Status: DC | PRN

## 2022-03-19 MED ORDER — CLOZAPINE 100 MG PO TABS
300.0000 mg | ORAL_TABLET | Freq: Every day | ORAL | Status: DC
Start: 2022-03-19 — End: 2023-02-19

## 2022-03-19 MED ORDER — ACETAMINOPHEN 325 MG PO TABS: 650.0000 mg | ORAL_TABLET | ORAL | Status: DC | PRN

## 2022-03-19 MED ORDER — CLONAZEPAM 1 MG PO TABS: 1.0000 mg | ORAL_TABLET | Freq: Three times a day (TID) | ORAL | 0 refills | Status: DC

## 2022-03-19 MED ORDER — ASPIRIN 81 MG PO CHEW: 81.0000 mg | CHEWABLE_TABLET | Freq: Every day | ORAL | Status: DC

## 2022-03-19 NOTE — Progress Notes (Signed)
Patient in bed, sleeping. In no distress. Sitter remains for safety. Will continue to monitor for safety. ?

## 2022-03-19 NOTE — BHH Suicide Risk Assessment (Signed)
[facility] Admission Suicide Risk Assessment ? ? ?Nursing information obtained from:    ?Demographic factors:    ?Current Mental Status:    ?Loss Factors:    ?Historical Factors:    ?Risk Reduction Factors:    ? ?Total Time spent with patient: 1 hour ?Principal Problem: Difficulty controlling behavior as late effect of traumatic brain injury ([place]) ?Diagnosis:  Principal Problem: ?  Difficulty controlling behavior as late effect of traumatic brain injury ([place]) ?Active Problems: ?  Schizophrenia, chronic condition with acute exacerbation ([place]) ?  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 ?Subjective Data: Patient seen and chart reviewed.  Patient well known from previous encounters.  He is unable to be interviewed because of his mental status and aphasia.  Patient was transferred to our unit because of an agreement between different hospital services to move him to different parts of the hospital to share the work.  Patient was very agitated screaming and yelling resisting moving until eventually he did agree to walk to his room.  No self-harm no indication of any suicidal behavior ? ?Continued Clinical Symptoms:  ?  ?The "Alcohol Use Disorders Identification Test", Guidelines for Use in Primary Care, Second Edition.  World Pharmacologist [facility]). ?Score between 0-7:  no or low risk or alcohol related problems. ?Score between 8-15:  moderate risk of alcohol related problems. ?Score between 16-19:  high risk of alcohol related problems. ?Score 20 or above:  warrants further diagnostic evaluation for alcohol dependence and treatment. ? ? ?CLINICAL FACTORS:  ? Severe Anxiety and/or Agitation ?Schizophrenia:   Paranoid or undifferentiated type ?Medical Diagnoses and Treatments/Surgeries ? ? ?Musculoskeletal: ?Strength & Muscle Tone: within normal limits ?Gait & Station: normal ?Patient leans: N/A ? ?Psychiatric Specialty Exam: ? ?Presentation  ?General Appearance: Disheveled ? ?Eye  Contact:None ? ?Speech:Clear and Coherent ? ?Speech Volume:Increased ? ?Handedness:Right ? ? ?Mood and Affect  ?Mood:Anxious; Irritable ? ?Affect:Congruent ? ? ?Thought Process  ?Thought Processes:Disorganized ? ?Descriptions of Associations:Loose ? ?Orientation:Partial ? ?Thought Content:Scattered; Illogical ? ?History of Schizophrenia/Schizoaffective disorder:Yes ? ?Duration of Psychotic Symptoms:Greater than six months ? ?Hallucinations:No data recorded ?Ideas of Reference:-- (UTA) ? ?Suicidal Thoughts:No data recorded ?Homicidal Thoughts:No data recorded ? ?Sensorium  ?Memory:Remote Poor; Recent Poor; Immediate Poor; Other (comment) (impaired memory and cognition) ? ?Judgment:Impaired ? ?Insight:Lacking ? ? ?Executive Functions  ?Concentration:Poor ? ?Attention Span:Poor ? ?Recall:Poor ? ?Fund of Knowledge:Poor ? ?Language:Poor ? ? ?Psychomotor Activity  ?Psychomotor Activity:No data recorded ? ?Assets  ?Assets:Housing; Resilience; Social Support; Talents/Skills ? ? ?Sleep  ?Sleep:No data recorded ? ? ?Physical Exam: ?Physical Exam ?Vitals and nursing note reviewed.  ?Constitutional:   ?   Appearance: Normal appearance.  ?HENT:  ?   Head: Normocephalic and atraumatic.  ?   Mouth/Throat:  ?   Pharynx: Oropharynx is clear.  ?Eyes:  ?   Pupils: Pupils are equal, round, and reactive to light.  ?Cardiovascular:  ?   Rate and Rhythm: Normal rate and regular rhythm.  ?Pulmonary:  ?   Effort: Pulmonary effort is normal.  ?   Breath sounds: Normal breath sounds.  ?Abdominal:  ?   General: Abdomen is flat.  ?   Palpations: Abdomen is soft.  ?Musculoskeletal:     ?   General: Normal range of motion.  ?Skin: ?   General: Skin is warm and dry.  ?Neurological:  ?   General: No focal deficit present.  ?   Mental Status: He is alert. Mental status is at baseline.  ?Psychiatric:     ?  Attention and Perception: He is inattentive.     ?   Mood and Affect: Affect is labile and inappropriate.     ?   Speech: He is  noncommunicative.     ?   Behavior: Behavior is agitated and aggressive.     ?   Cognition and Memory: Cognition is impaired. Memory is impaired. He exhibits impaired recent memory and impaired remote memory.     ?   Judgment: Judgment is inappropriate.  ? ?Review of Systems  ?Unable to perform ROS: Patient nonverbal  ?There were no vitals taken for this visit. There is no height or weight on file to calculate BMI. ? ? ?COGNITIVE FEATURES THAT CONTRIBUTE TO RISK:  ?Loss of executive function   ? ?SUICIDE RISK:  ? Minimal: No identifiable suicidal ideation.  Patients presenting with no risk factors but with morbid ruminations; may be classified as minimal risk based on the severity of the depressive symptoms ? ?PLAN OF CARE: Continue 15-minute checks.  Continue monitoring on the back hall using one-to-one monitoring as necessary.  Medications all ordered including as needed.  Ongoing assessment of dangerousness to be included in discharge planning ? ?I certify that inpatient services furnished can reasonably be expected to improve the patient's condition.  ? ?Alethia Berthold, MD ?03/19/2022, 3:56 PM ? ?

## 2022-03-19 NOTE — Progress Notes (Signed)
Patient constantly banging on the door and yelling. Refused take medications at this time. Put manual hold on to patient to administer IM injection. Patient cooperative with manual hold.No distress noted.Patient monitoring with 1:1 sitter. ?

## 2022-03-19 NOTE — Progress Notes (Signed)
Patient discharged to behavior health. Report given to nurse Gigi. ?

## 2022-03-19 NOTE — Progress Notes (Signed)
Patient continues with 1:1 sitter for behaviors and safety. Patient currently in bed with sitter at bedside. No complaints or concerns voiced at this time. Patient calm at present. Will continue to monitor for safety.  ?
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
## 2022-03-19 NOTE — Progress Notes (Signed)
Patient compliant with medication and dinner. No agitated behaviors noted at this time. Continue monitoring with 1 : 1 sitter. ?

## 2022-03-19 NOTE — Progress Notes (Signed)
Patient currently in dayroom eating snack, medications given and taken as ordered. No complaints or concerns voiced.  ?

## 2022-03-19 NOTE — Progress Notes (Signed)
Patient in bed resting, eyes closed in no distress. 1:1 sitter remains at bedside for safety and behaviors. Fluids and nutrition offered. Will continue to monitor.  ?

## 2022-03-19 NOTE — Progress Notes (Signed)
61 years old male patient admitted from medical unit. Patient refused to come into the unit. Patient standing at the exit door and yelling and cursing at staff.Multiple staff tried to deescalate and redirect patient for about 30 minutes. Gave patient an option to sit down on restraint chair or walk to his room. Patient choose to walk to his room. Put patient back on the closed unit with 1: 1.Patient constantly banging on the door and yelling. Support and verbal de escalation given. ?

## 2022-03-19 NOTE — Progress Notes (Signed)
Pharmacy Consult - Clozapine   ?  ?61 yo male ordered clozapine 300 mg PO qHS  ?  ?Clozapine REMS enrollment Verified: yes ?Clozapine REMS enrollment: 01/04/22  ?REMS patient ID: [id number] ?  ?Last dose: 4/11 @ 2121 - HELD 4/12 due to drop in ANC ?  ?Dose Adjustments This Admission:   ?Decrease dose from 375mg  to 300mg  on  4/10 ?  ? ?Dose Adjustments This Admission: ?2/3 started on clozapine 25mg   ?2/10 clozapine 100 mg  ?2/20 clozapine 175 mg ?2/22 clozapine 200 mg  ?3/06 clozapine 250 mg ?3/08 clozapine 300 mg ?3/17 clozapine 350 mg ?4/10 clozapine 300 mg ?  ?Labs: ?Date: 01/04/22 ANC: 2800   Submitted: 01/04/22 ?Date: 01/14/22 ANC: 2700   Submitted: 01/14/22 ?Date: 01/22/22 ANC: 4000   Submitted: 01/22/22 ?Date: 01/29/22 ANC: 2500   Submitted: 01/29/22 ?Date: 02/05/22 ANC: 2300   Submitted: 02/05/22 ?Date: 02/13/22 ANC: 2800   Submitted: 02/07/22 ?Date: 02/21/22 ANC: 2300   Submitted: 02/21/22 ?Date: 02/28/22 ANC: 4800 ?Date: 03/07/22 ANC: 7900 ?Date: 03/14/22 ANC: 1700   Submitted as 1200 in error ?Date: 03/15/22 ANC: 1800   Submitted: 03/15/22 ?Date: 03/19/22 ANC: 6700   Submitted: 03/19/22 ?  ?  ?Plan: ?Resume clozapine 300mg  qHS as previously ordered by Dr 03/17/22 ?Next ANC due 4/19 given recent drop in ANC ?Monitor ANC 3x/week for 1 week due to significant drop in ANC ? ?**CLOZAPINE MONITORING** ?(reflects NEW REMS GUIDELINES EFFECTIVE 09/13/2014): ?Check ANC at least weekly while inpatient. ? ?For general population patients, i.e., those without benign ethnic neutropenia (BEN): ?--If ANC 1000-1499, increase ANC monitoring to 3x/wk ?--If ANC < 1000, HOLD CLOZAPINE and get psych consult ? ? ?

## 2022-03-20 DIAGNOSIS — R4689 Other symptoms and signs involving appearance and behavior: Secondary | ICD-10-CM | POA: Diagnosis not present

## 2022-03-20 DIAGNOSIS — S069X0S Unspecified intracranial injury without loss of consciousness, sequela: Secondary | ICD-10-CM | POA: Diagnosis not present

## 2022-03-20 NOTE — Plan of Care (Signed)
D- Patient alert and oriented. Patient presented in a pleasant mood on first approach, but then became irritable after this writer tried to obtain a set of vitals. Patient did not like the cuff squeezing his arm and took it off. Patient is childlike and only speaks a few words, all this writer has heard patient say is "no". Patient does not appear to be in any pain or acute distress. This writer is unable to assess if patient is experiencing any signs/symptoms of depression/anxiety or SI/HI/AVH.  ? ?A- Some scheduled medications administered to patient, per MD orders. Support and encouragement provided.  Routine safety checks conducted every 15 minutes.  Patient informed to notify staff with problems or concerns. ? ?R- No adverse drug reactions noted. Patient compliant with medications and treatment plan. Patient remains safe at this time. ? ?Problem: Safety: ?Goal: Violent Restraint(s) ?Outcome: Progressing ?  ?

## 2022-03-20 NOTE — Progress Notes (Signed)
MD was notified of this writer holding patient's BP medication and was ok with this decision. ?

## 2022-03-20 NOTE — Progress Notes (Signed)
[facility] MD Progress Note ? ?03/20/2022 12:38 PM ?Adam [name]  ?MRN:  [id number] ?Subjective: Follow-up for this 61 year old man with a history of behavior problems related to head injury.  It has been a day since he came onto our unit and he has started to calm down.  Still occasionally pounds on the door but with the available one-to-one staff is mostly redirectable.  Patient had no new indication of any complaint today.  Appears medically stable ?Principal Problem: Difficulty controlling behavior as late effect of traumatic brain injury (HCC) ?Diagnosis: Principal Problem: ?  Difficulty controlling behavior as late effect of traumatic brain injury (HCC) ?Active Problems: ?  Schizophrenia, chronic condition with acute exacerbation (HCC) ?  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 ?Total Time spent with patient: 30 minutes ? ?Past Psychiatric History: See intake note ?Past Medical History:  ?Past Medical History:  ?Diagnosis Date  ? Myocardial infarction [facility])   ? Schizophrenia (HCC)   ? Stroke [facility])   ? TBI (traumatic brain injury) (HCC)   ? History reviewed. No pertinent surgical history. ?Family History: History reviewed. No pertinent family history. ?Family Psychiatric  History: No information available ?Social History:  ?Social History  ? ?Substance and Sexual Activity  ?Alcohol Use Not Currently  ?   ?Social History  ? ?Substance and Sexual Activity  ?Drug Use Not Currently  ?  ?Social History  ? ?Socioeconomic History  ? Marital status: Single  ?  Spouse name: Not on file  ? Number of children: Not on file  ? Years of education: Not on file  ? Highest education level: Not on file  ?Occupational History  ? Not on file  ?Tobacco Use  ? Smoking status: Never  ?  Passive exposure: Never  ? Smokeless tobacco: Never  ?Vaping Use  ? Vaping Use: Unknown  ?Substance and Sexual Activity  ? Alcohol use: Not Currently  ? Drug use: Not Currently  ? Sexual activity: Not Currently  ?Other Topics  Concern  ? Not on file  ?Social History Narrative  ? Not on file  ? ?Social Determinants of Health  ? ?Financial Resource Strain: Not on file  ?Food Insecurity: Not on file  ?Transportation Needs: Not on file  ?Physical Activity: Not on file  ?Stress: Not on file  ?Social Connections: Not on file  ? ?Additional Social History:  ?  ?  ?  ?  ?  ?  ?  ?  ?  ?  ?  ? ?Sleep: Fair ? ?Appetite:  Fair ? ?Current Medications: ?Current Facility-Administered Medications  ?Medication Dose Route Frequency Provider Last Rate Last Admin  ? acetaminophen (TYLENOL) tablet 650 mg  650 mg Oral Q6H PRN Senan Urey T, MD      ? alum & mag hydroxide-simeth (MAALOX/MYLANTA) 200-200-20 MG/5ML suspension 30 mL  30 mL Oral Q4H PRN Brissa Asante T, MD      ? aspirin EC tablet 81 mg  81 mg Oral Daily Srihitha Tagliaferri, [name], MD   81 mg at 03/20/22 8299  ? atorvastatin (LIPITOR) tablet 20 mg  20 mg Oral Daily Aimie Wagman, [name], MD   20 mg at 03/20/22 0817  ? benztropine (COGENTIN) tablet 0.5 mg  0.5 mg Oral BID Alexiz Sustaita, [name], MD   0.5 mg at 03/20/22 0818  ? clonazePAM (KLONOPIN) tablet 1 mg  1 mg Oral TID Kella Splinter, [name], MD   1 mg at 03/20/22 1213  ? cloZAPine (CLOZARIL) tablet 300 mg  300 mg  Oral QHS Kaylena Pacifico, [name], MD   300 mg at 03/19/22 2156  ? diphenhydrAMINE (BENADRYL) capsule 50 mg  50 mg Oral Q6H PRN Antanasia Kaczynski, [name], MD      ? Or  ? diphenhydrAMINE (BENADRYL) injection 50 mg  50 mg Intramuscular Q6H PRN Delvin Hedeen, [name], MD      ? gabapentin (NEURONTIN) capsule 300 mg  300 mg Oral TID Kazimir Hartnett, [name], MD   300 mg at 03/20/22 1213  ? haloperidol (HALDOL) tablet 5 mg  5 mg Oral TID Kirt Chew, [name], MD   5 mg at 03/20/22 1213  ? haloperidol (HALDOL) tablet 5 mg  5 mg Oral Q6H PRN Krina Mraz, [name], MD      ? Or  ? haloperidol lactate (HALDOL) injection 5 mg  5 mg Intramuscular Q6H PRN Zenna Traister T, MD      ? magnesium hydroxide (MILK OF MAGNESIA) suspension 30 mL  30 mL Oral Daily PRN Nyelle Wolfson T, MD      ? metoprolol succinate (TOPROL-XL)  24 hr tablet 25 mg  25 mg Oral Daily Krystina Strieter, [name], MD   25 mg at 03/19/22 1659  ? [START ON 03/29/2022] paliperidone (INVEGA SUSTENNA) injection 156 mg  156 mg Intramuscular Q28 days Lara Palinkas T, MD      ? temazepam (RESTORIL) capsule 15 mg  15 mg Oral QHS Mete Purdum T, MD   15 mg at 03/19/22 2156  ? ziprasidone (GEODON) injection 20 mg  20 mg Intramuscular Q12H PRN Daniesha Driver, [name], MD   20 mg at 03/19/22 1551  ? ? ?Lab Results:  ?Results for orders placed or performed during the hospital encounter of 07/20/21 (from the past 48 hour(s))  ?CBC with Differential/Platelet     Status: None  ? Collection Time: 03/19/22  9:39 AM  ?Result Value Ref Range  ? WBC 9.1 4.0 - 10.5 K/uL  ? RBC 4.73 4.22 - 5.81 MIL/uL  ? Hemoglobin 14.3 13.0 - 17.0 g/dL  ? HCT 43.3 39.0 - 52.0 %  ? MCV 91.5 80.0 - 100.0 fL  ? MCH 30.2 26.0 - 34.0 pg  ? MCHC 33.0 30.0 - 36.0 g/dL  ? RDW 13.1 11.5 - 15.5 %  ? Platelets 216 150 - 400 K/uL  ? nRBC 0.0 0.0 - 0.2 %  ? Neutrophils Relative % 73 %  ? Neutro Abs 6.7 1.7 - 7.7 K/uL  ? Lymphocytes Relative 19 %  ? Lymphs Abs 1.7 0.7 - 4.0 K/uL  ? Monocytes Relative 7 %  ? Monocytes Absolute 0.6 0.1 - 1.0 K/uL  ? Eosinophils Relative 0 %  ? Eosinophils Absolute 0.0 0.0 - 0.5 K/uL  ? Basophils Relative 1 %  ? Basophils Absolute 0.1 0.0 - 0.1 K/uL  ? Immature Granulocytes 0 %  ? Abs Immature Granulocytes 0.04 0.00 - 0.07 K/uL  ?  Comment: Performed at [facility], [address], [place], [place] [address]  ? ? ?Blood Alcohol level:  ?Lab Results  ?Component Value Date  ? ETH <10 07/20/2021  ? ? ?Metabolic Disorder Labs: ?No results found for: HGBA1C, MPG ?No results found for: PROLACTIN ?Lab Results  ?Component Value Date  ? CHOL 167 11/20/2021  ? TRIG 107 11/20/2021  ? HDL 26 (L) 11/20/2021  ? CHOLHDL 6.4 11/20/2021  ? VLDL 21 11/20/2021  ? LDLCALC 120 (H) 11/20/2021  ? ? ?Physical Findings: ?AIMS:  , ,  ,  ,    ?CIWA:    ?  COWS:    ? ?Musculoskeletal: ?Strength & Muscle Tone: within  normal limits ?Gait & Station: normal ?Patient leans: N/A ? ?Psychiatric Specialty Exam: ? ?Presentation  ?General Appearance: Disheveled ? ?Eye Contact:None ? ?Speech:Clear and Coherent ? ?Speech Volume:Increased ? ?Handedness:Right ? ? ?Mood and Affect  ?Mood:Anxious; Irritable ? ?Affect:Congruent ? ? ?Thought Process  ?Thought Processes:Disorganized ? ?Descriptions of Associations:Loose ? ?Orientation:Partial ? ?Thought Content:Scattered; Illogical ? ?History of Schizophrenia/Schizoaffective disorder:Yes ? ?Duration of Psychotic Symptoms:Greater than six months ? ?Hallucinations:No data recorded ?Ideas of Reference:-- (UTA) ? ?Suicidal Thoughts:No data recorded ?Homicidal Thoughts:No data recorded ? ?Sensorium  ?Memory:Remote Poor; Recent Poor; Immediate Poor; Other (comment) (impaired memory and cognition) ? ?Judgment:Impaired ? ?Insight:Lacking ? ? ?Executive Functions  ?Concentration:Poor ? ?Attention Span:Poor ? ?Recall:Poor ? ?Fund of Knowledge:Poor ? ?Language:Poor ? ? ?Psychomotor Activity  ?Psychomotor Activity:No data recorded ? ?Assets  ?Assets:Housing; Resilience; Social Support; Talents/Skills ? ? ?Sleep  ?Sleep:No data recorded ? ? ?Physical Exam: ?Physical Exam ?Vitals and nursing note reviewed.  ?Constitutional:   ?   Appearance: Normal appearance.  ?HENT:  ?   Head: Normocephalic and atraumatic.  ?   Mouth/Throat:  ?   Pharynx: Oropharynx is clear.  ?Eyes:  ?   Pupils: Pupils are equal, round, and reactive to light.  ?Cardiovascular:  ?   Rate and Rhythm: Normal rate and regular rhythm.  ?Pulmonary:  ?   Effort: Pulmonary effort is normal.  ?   Breath sounds: Normal breath sounds.  ?Abdominal:  ?   General: Abdomen is flat.  ?   Palpations: Abdomen is soft.  ?Musculoskeletal:     ?   General: Normal range of motion.  ?Skin: ?   General: Skin is warm and dry.  ?Neurological:  ?   General: No focal deficit present.  ?   Mental Status: He is alert. Mental status is at baseline.  ?Psychiatric:     ?    Attention and Perception: He is inattentive.     ?   Mood and Affect: Affect is labile and inappropriate.     ?   Speech: He is noncommunicative.     ?   Cognition and Memory: Cognition is impaired.  ? ?Re

## 2022-03-20 NOTE — Progress Notes (Signed)
This writer attempted to obtain a set of vitals on patient and once the cuff started squeezing his arm he became upset and took off the cuff. This Clinical research associate held patient's scheduled BP medication being that a blood pressure was not obtained. MD will be notified. Patient did tolerate all of his other medications without any issues. Patient remains safe. ?

## 2022-03-20 NOTE — Plan of Care (Signed)
  Problem: Safety: Goal: Violent Restraint(s) Outcome: Progressing   

## 2022-03-20 NOTE — Progress Notes (Signed)
Patient remains in bed sleeping, eyes closed in no distress. Remains with sitter for behavior and safety. Will continue to monitor for safety.  ?

## 2022-03-20 NOTE — Progress Notes (Signed)
Recreation Therapy Notes ? ?Date: 03/20/2022 ? ?Time: 10:05 am   ? ?Location: Courtyard    ? ?Behavioral response: Appropriate ? ?Intervention Topic: Leisure   ? ?Discussion/Intervention:  ?Group content today was focused on leisure. The group defined what leisure is and some positive leisure activities they participate in. Individuals identified the difference between good and bad leisure. Participants expressed how they feel after participating in the leisure of their choice. The group discussed how they go about picking a leisure activity and if others are involved in their leisure activities. The patient stated how many leisure activities they have to choose from and reasons why it is important to have leisure time. Individuals participated in the intervention ?Exploration of Leisure? where they had a chance to identify new leisure activities as well as benefits of leisure. ?Clinical Observations/Feedback: ?Patient came to group and was able to explore participate in many leisure activities. Participant was able to identify leisure activities they enjoy outside of the hospital. Individual was social with peers and staff while participating in the intervention.    ?[name] LRT/CTRS  ? ? ? ? ? ? ? ? ? ? ? ?Adam [name] ?03/20/2022 11:43 AM ?

## 2022-03-20 NOTE — Progress Notes (Signed)
Patient currently in bed resting, eyes closed in no distress. Remains 1:1 with sitter for safety. In no distress. Patient became slightly agitated during shift making gestures to request his bra and personal items. Writer unable to locate belongings sheet or belongings. Patient was able to be re-directed to bedroom. Medication compliant. Appetite good. Makes needs known through gestures and grunts.  ?Encouragement and support provided. Safety checks maintained. Medications given as prescribed. Pt remains safe on unit with q 15 min checks. ?

## 2022-03-20 NOTE — Progress Notes (Signed)
1:1 Patient Hourly Rounding: ? ?0800: Patient is in the dayroom, with his assigned safety sitter present at his side. ? ?0900: Patient is in the dayroom, with his assigned safety sitter present at his side. ? ?1000: Patient is in the dayroom, with his assigned safety sitter present at his side. ? ?1100: Patient is walking, in the hallway, with his assigned safety sitter present at his side. ?  ?1200: Patient is in the dayroom, eating lunch, with his assigned safety sitter present.  ? ?1300: Patient is in his room, laying in bed, with his assigned safety sitter present at his bedside. ? ?1400: Patient is in his room, laying in bed, with his assigned safety sitter present at bedside. ? ?1500: Patient is sitting in a chair, in the hallway, with his assigned safety sitter present at his side. ? ?1600: Patient is in the dayroom, with his assigned safety sitter present. ? ?1700: Patient is in the dayroom, eating dinner, with his assigned safety sitter present. ? ?1800: Patient is outside, in the courtyard, with other members on the unit and his Recruitment consultant. ? ?1900: Patient is in the dayroom, looking at some printouts, with his assigned safety sitter present. ?

## 2022-03-20 NOTE — Progress Notes (Signed)
This writer attempted to obtain a blood pressure/heart rate on patient, however, when patient saw the dinamap, he yelled out "no". Patient tolerated medication administration well, without any issues. Patient remains safe on the unit. ?

## 2022-03-20 NOTE — Group Note (Signed)
LCSW Group Therapy Note ? ?Group Date: 03/20/2022 ?Start Time: 1300 ?End Time: 1400 ? ? ?Type of Therapy and Topic:  Group Therapy - How To Cope with Nervousness about Discharge  ? ?Participation Level:  Did Not Attend  ? ?Description of Group ?This process group involved identification of patients' feelings about discharge. Some of them are scheduled to be discharged soon, while others are new admissions, but each of them was asked to share thoughts and feelings surrounding discharge from the hospital. One common theme was that they are excited at the prospect of going home, while another was that many of them are apprehensive about sharing why they were hospitalized. Patients were given the opportunity to discuss these feelings with their peers in preparation for discharge. ? ?Therapeutic Goals ? ?Patient will identify their overall feelings about pending discharge. ?Patient will think about how they might proactively address issues that they believe will once again arise once they get home (i.e. with parents). ?Patients will participate in discussion about having hope for change. ? ? ?Summary of Patient Progress:  Patient currently on back hall with 1:1 due to agitation protocol. Not currently appropriate for group setting. Staff to continue safety risk and allow patient to reintegrate with milieu when appropriate.  ? ? ?Therapeutic Modalities ?Cognitive Behavioral Therapy ? ? ?Corky Crafts, LCSWA ?03/20/2022  2:31 PM   ?

## 2022-03-21 DIAGNOSIS — S069X0S Unspecified intracranial injury without loss of consciousness, sequela: Secondary | ICD-10-CM | POA: Diagnosis not present

## 2022-03-21 DIAGNOSIS — R4689 Other symptoms and signs involving appearance and behavior: Secondary | ICD-10-CM | POA: Diagnosis not present

## 2022-03-21 LAB — CBC WITH DIFFERENTIAL/PLATELET
Abs Immature Granulocytes: 0.01 10*3/uL (ref 0.00–0.07)
Basophils Absolute: 0.1 10*3/uL (ref 0.0–0.1)
Basophils Relative: 2 %
Eosinophils Absolute: 0 10*3/uL (ref 0.0–0.5)
Eosinophils Relative: 0 %
HCT: 39.4 % (ref 39.0–52.0)
Hemoglobin: 12.9 g/dL — ABNORMAL LOW (ref 13.0–17.0)
Immature Granulocytes: 0 %
Lymphocytes Relative: 24 %
Lymphs Abs: 1.4 10*3/uL (ref 0.7–4.0)
MCH: 30 pg (ref 26.0–34.0)
MCHC: 32.7 g/dL (ref 30.0–36.0)
MCV: 91.6 fL (ref 80.0–100.0)
Monocytes Absolute: 0.4 10*3/uL (ref 0.1–1.0)
Monocytes Relative: 7 %
Neutro Abs: 3.9 10*3/uL (ref 1.7–7.7)
Neutrophils Relative %: 67 %
Platelets: 216 10*3/uL (ref 150–400)
RBC: 4.3 MIL/uL (ref 4.22–5.81)
RDW: 13.2 % (ref 11.5–15.5)
WBC: 5.8 10*3/uL (ref 4.0–10.5)
nRBC: 0 % (ref 0.0–0.2)

## 2022-03-21 NOTE — Progress Notes (Signed)
Pharmacy Consult - Clozapine   ?  ?61 yo male ordered clozapine 300 mg PO qHS  ?  ?Clozapine REMS enrollment Verified: yes ?Clozapine REMS enrollment: 01/04/22  ?REMS patient ID: [id number] ?  ?Last dose: 4/11 @ 2121 - HELD 4/12 due to drop in [place] ?  ?Dose Adjustments This Admission:   ?Decrease dose from 375mg  to 300mg  on  4/10 ?  ? ?Dose Adjustments This Admission: ?2/3 started on clozapine 25mg   ?2/10 clozapine 100 mg  ?2/20 clozapine 175 mg ?2/22 clozapine 200 mg  ?3/06 clozapine 250 mg ?3/08 clozapine 300 mg ?3/17 clozapine 350 mg ?4/10 clozapine 300 mg ?  ?Labs: ?Date: 01/04/22 ANC: 2800   Submitted: 01/04/22 ?Date: 01/14/22 ANC: 2700   Submitted: 01/14/22 ?Date: 01/22/22 ANC: 4000   Submitted: 01/22/22 ?Date: 01/29/22 ANC: 2500   Submitted: 01/29/22 ?Date: 02/05/22 ANC: 2300   Submitted: 02/05/22 ?Date: 02/13/22 ANC: 2800   Submitted: 02/07/22 ?Date: 02/21/22 ANC: 2300   Submitted: 02/21/22 ?Date: 02/28/22 ANC: 4800 ?Date: 03/07/22 [place]: 7900 ?Date: 03/14/22 ANC: 1700   Submitted as 1200 in error ?Date: 03/15/22 ANC: 1800   Submitted: 03/15/22 ?Date: 03/21/22 ANC: 3900  ?  ?  ?Plan: ?Resume clozapine 300mg  qHS as previously ordered by Dr Weber Cooks ?Transition back to weekly ANC  ? ?**CLOZAPINE MONITORING** ?(reflects NEW REMS GUIDELINES EFFECTIVE 09/13/2014): ?Check ANC at least weekly while inpatient. ? ?For general population patients, i.e., those without benign ethnic neutropenia (BEN): ?--If [place] 1000-1499, increase ANC monitoring to 3x/wk ?--If ANC < 1000, HOLD CLOZAPINE and get psych consult ? ?Dorothe Pea, PharmD, BCPS ?Clinical Pharmacist   ? ? ?

## 2022-03-21 NOTE — Progress Notes (Signed)
Patient appears in a pleasant mood and enjoy being outside with staff & peers. Patient did not ask for phone anymore. Patient maintained ADLs. Safety maintained with 1:1 sitter. ?

## 2022-03-21 NOTE — Progress Notes (Signed)
Patient pleasant in dayroom with sitter listening to rock music.  Will continue to monitor. ? ? ? ? ? ?C Butler-Nicholson, LPN ?

## 2022-03-21 NOTE — Progress Notes (Signed)
Recreation Therapy Notes ? ? ?Date: 03/21/2022 ?  ?Time: 10:50 am ?  ?Location: Craft room    ?  ?Behavioral response: N/A ? ?Intervention Topic: Self-esteem   ?  ?Discussion/Intervention:  ?Patient refused to attend group. ? ?Clinical Observations/Feedback: ?Patient refused to attend group. ? ?Adam [name] LRT/CTRS  ? ? ? ? ? ? ? ?Adam [name] ?03/21/2022 3:15 PM ?

## 2022-03-21 NOTE — Progress Notes (Signed)
Patient exhibits some agitation at this encounter wanting some eyeglasses to wear after seeing staff with a pair of readers on.   ? ?A pair of reading glasses prescription strength 125 was provided to patient and he calmed down afterward. ? ? ? ?C Butler-Nicholson, LPN ?

## 2022-03-21 NOTE — Progress Notes (Signed)
Patient awake. Has urinated on himself during sleep. Bed linen changed. Toiletries provided along with new set of clothes.  Assisted with bed and clothing change.  Will continue to monitor with q 15 minute safety changes. ? ? ? ? ? ? ?C Butler-Nicholson, LPN ?

## 2022-03-21 NOTE — Progress Notes (Signed)
Patient received his QHS meds and tolerated without incident. Unable to verbalize in depth thoughts or feelings. Subjectively he is in a pleasant mood listening to music and eating snacks that was provided to him earlier.  Will continue to monitor. ? ? ? ? ?C Butler-Nicholson, LPN ?

## 2022-03-21 NOTE — Plan of Care (Signed)
  Problem: Safety: Goal: Violent Restraint(s) Outcome: Progressing   

## 2022-03-21 NOTE — Progress Notes (Signed)
Patient asleep in bed. Will continue to monitor with q 15 minute safety checks. ?  ?  ?  ?C Butler-Nicholson, LPN ?

## 2022-03-21 NOTE — Group Note (Signed)
[facility] LCSW Group Therapy Note ? ? ?Group Date: 03/21/2022 ?Start Time: 1330 ?End Time: 1430 ? ? ?Type of Therapy/Topic:  Group Therapy:  Emotion Regulation ? ?Participation Level:  Did Not Attend  ? ? ? ?Description of Group:   ? The purpose of this group is to assist patients in learning to regulate negative emotions and experience positive emotions. Patients will be guided to discuss ways in which they have been vulnerable to their negative emotions. These vulnerabilities will be juxtaposed with experiences of positive emotions or situations, and patients challenged to use positive emotions to combat negative ones. Special emphasis will be placed on coping with negative emotions in conflict situations, and patients will process healthy conflict resolution skills. ? ?Therapeutic Goals: ?Patient will identify two positive emotions or experiences to reflect on in order to balance out negative emotions:  ?Patient will label two or more emotions that they find the most difficult to experience:  ?Patient will be able to demonstrate positive conflict resolution skills through discussion or role plays:  ? ?Summary of Patient Progress: ? ? ?x ? ? ? ?Therapeutic Modalities:   ?Cognitive Behavioral Therapy ?Feelings Identification ?Dialectical Behavioral Therapy ? ? ?Huma Imhoff F Alfie Alderfer, Student-Social Work ?

## 2022-03-21 NOTE — Progress Notes (Signed)
Patient asleep in bed. Will continue to monitor with q 15 minute safety checks. ?  ?  ?  ?C Butler-Nicholson, LPN ?

## 2022-03-21 NOTE — Progress Notes (Signed)
Patient in bed resting with eyes closed. Will continue to monitor. ? ? ?C Butler-Nicholson, LPN ?

## 2022-03-21 NOTE — Progress Notes (Signed)
Patient sleeping . Continue monitoring with 1 : 1 sitter. ?

## 2022-03-21 NOTE — Progress Notes (Signed)
Patient resting in bed at this time. Does not appear to be in pain or any distress. No complaints or concerns verbalized.Continue monitor with 1:1 sitter. ?

## 2022-03-21 NOTE — Progress Notes (Signed)
[facility] MD Progress Note ? ?03/21/2022 11:38 AM ?Adam [name]  ?MRN:  [id number] ?Subjective: Patient seen and chart reviewed.  Follow-up for this gentleman with a history of brain injury and possible schizophrenia who is currently on the psychiatric ward as part of his long hospitalization.  Patient is unable to communicate verbally but can sometimes make his general response or needs known by gestures affect some degree of pantomime.  Went back to see him today and as usual he recognized me but was not agitated.  Seemed to understand my questions about how he was doing and did not appear to have any indication of complaint.  Staff is working on trying to make arrangements to get him things to keep him occupied as they did on the medical service. ?Principal Problem: Difficulty controlling behavior as late effect of traumatic brain injury (HCC) ?Diagnosis: Principal Problem: ?  Difficulty controlling behavior as late effect of traumatic brain injury (HCC) ?Active Problems: ?  Schizophrenia, chronic condition with acute exacerbation (HCC) ?  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 ?Total Time spent with patient: 30 minutes ? ?Past Psychiatric History: Past history of supposed schizophrenia and a history of brain injury causing severe neurologic impairment.  Most past detail still on specific ? ?Past Medical History:  ?Past Medical History:  ?Diagnosis Date  ? Myocardial infarction [facility])   ? Schizophrenia (HCC)   ? Stroke [facility])   ? TBI (traumatic brain injury) (HCC)   ? History reviewed. No pertinent surgical history. ?Family History: History reviewed. No pertinent family history. ?Family Psychiatric  History: See previous ?Social History:  ?Social History  ? ?Substance and Sexual Activity  ?Alcohol Use Not Currently  ?   ?Social History  ? ?Substance and Sexual Activity  ?Drug Use Not Currently  ?  ?Social History  ? ?Socioeconomic History  ? Marital status: Single  ?  Spouse name: Not on  file  ? Number of children: Not on file  ? Years of education: Not on file  ? Highest education level: Not on file  ?Occupational History  ? Not on file  ?Tobacco Use  ? Smoking status: Never  ?  Passive exposure: Never  ? Smokeless tobacco: Never  ?Vaping Use  ? Vaping Use: Unknown  ?Substance and Sexual Activity  ? Alcohol use: Not Currently  ? Drug use: Not Currently  ? Sexual activity: Not Currently  ?Other Topics Concern  ? Not on file  ?Social History Narrative  ? Not on file  ? ?Social Determinants of Health  ? ?Financial Resource Strain: Not on file  ?Food Insecurity: Not on file  ?Transportation Needs: Not on file  ?Physical Activity: Not on file  ?Stress: Not on file  ?Social Connections: Not on file  ? ?Additional Social History:  ?  ?  ?  ?  ?  ?  ?  ?  ?  ?  ?  ? ?Sleep: Fair ? ?Appetite:  Fair ? ?Current Medications: ?Current Facility-Administered Medications  ?Medication Dose Route Frequency Provider Last Rate Last Admin  ? acetaminophen (TYLENOL) tablet 650 mg  650 mg Oral Q6H PRN Ariadna Setter T, MD      ? alum & mag hydroxide-simeth (MAALOX/MYLANTA) 200-200-20 MG/5ML suspension 30 mL  30 mL Oral Q4H PRN Sakina Briones T, MD      ? aspirin EC tablet 81 mg  81 mg Oral Daily Herberto Ledwell, [name], MD   81 mg at 03/21/22 1116  ? atorvastatin (LIPITOR) tablet 20  mg  20 mg Oral Daily Brynne Doane, [name], MD   20 mg at 03/21/22 1116  ? benztropine (COGENTIN) tablet 0.5 mg  0.5 mg Oral BID Alethea Terhaar T, MD   0.5 mg at 03/21/22 1115  ? clonazePAM (KLONOPIN) tablet 1 mg  1 mg Oral TID Juana Montini, [name], MD   1 mg at 03/21/22 1115  ? cloZAPine (CLOZARIL) tablet 300 mg  300 mg Oral QHS Cardin Nitschke, [name], MD   300 mg at 03/20/22 2117  ? diphenhydrAMINE (BENADRYL) capsule 50 mg  50 mg Oral Q6H PRN Mattis Featherly, [name], MD      ? Or  ? diphenhydrAMINE (BENADRYL) injection 50 mg  50 mg Intramuscular Q6H PRN Keishana Klinger T, MD      ? gabapentin (NEURONTIN) capsule 300 mg  300 mg Oral TID Haidyn Chadderdon T, MD   300 mg at 03/21/22  1116  ? haloperidol (HALDOL) tablet 5 mg  5 mg Oral TID Kewana Sanon, [name], MD   5 mg at 03/21/22 1115  ? haloperidol (HALDOL) tablet 5 mg  5 mg Oral Q6H PRN Anjalee Cope, [name], MD      ? Or  ? haloperidol lactate (HALDOL) injection 5 mg  5 mg Intramuscular Q6H PRN Kacelyn Rowzee T, MD      ? magnesium hydroxide (MILK OF MAGNESIA) suspension 30 mL  30 mL Oral Daily PRN Ersilia Brawley T, MD      ? metoprolol succinate (TOPROL-XL) 24 hr tablet 25 mg  25 mg Oral Daily Astra Gregg, [name], MD   25 mg at 03/21/22 1126  ? [START ON 03/29/2022] paliperidone (INVEGA SUSTENNA) injection 156 mg  156 mg Intramuscular Q28 days Wallice Granville T, MD      ? temazepam (RESTORIL) capsule 15 mg  15 mg Oral QHS Kylena Mole T, MD   15 mg at 03/20/22 2117  ? ziprasidone (GEODON) injection 20 mg  20 mg Intramuscular Q12H PRN Octavis Sheeler, [name], MD   20 mg at 03/19/22 1551  ? ? ?Lab Results: No results found for this or any previous visit (from the past 48 hour(s)). ? ?Blood Alcohol level:  ?Lab Results  ?Component Value Date  ? ETH <10 07/20/2021  ? ? ?Metabolic Disorder Labs: ?No results found for: HGBA1C, MPG ?No results found for: PROLACTIN ?Lab Results  ?Component Value Date  ? CHOL 167 11/20/2021  ? TRIG 107 11/20/2021  ? HDL 26 (L) 11/20/2021  ? CHOLHDL 6.4 11/20/2021  ? VLDL 21 11/20/2021  ? LDLCALC 120 (H) 11/20/2021  ? ? ?Physical Findings: ?AIMS:  , ,  ,  ,    ?CIWA:    ?COWS:    ? ?Musculoskeletal: ?Strength & Muscle Tone: within normal limits ?Gait & Station: normal ?Patient leans: N/A ? ?Psychiatric Specialty Exam: ? ?Presentation  ?General Appearance: Disheveled ? ?Eye Contact:None ? ?Speech:Clear and Coherent ? ?Speech Volume:Increased ? ?Handedness:Right ? ? ?Mood and Affect  ?Mood:Anxious; Irritable ? ?Affect:Congruent ? ? ?Thought Process  ?Thought Processes:Disorganized ? ?Descriptions of Associations:Loose ? ?Orientation:Partial ? ?Thought Content:Scattered; Illogical ? ?History of Schizophrenia/Schizoaffective  disorder:Yes ? ?Duration of Psychotic Symptoms:Greater than six months ? ?Hallucinations:No data recorded ?Ideas of Reference:-- (UTA) ? ?Suicidal Thoughts:No data recorded ?Homicidal Thoughts:No data recorded ? ?Sensorium  ?Memory:Remote Poor; Recent Poor; Immediate Poor; Other (comment) (impaired memory and cognition) ? ?Judgment:Impaired ? ?Insight:Lacking ? ? ?Executive Functions  ?Concentration:Poor ? ?Attention Span:Poor ? ?Recall:Poor ? ?Fund of Knowledge:Poor ? ?Language:Poor ? ? ?Psychomotor Activity  ?Psychomotor Activity:No data  recorded ? ?Assets  ?Assets:Housing; Resilience; Social Support; Talents/Skills ? ? ?Sleep  ?Sleep:No data recorded ? ? ?Physical Exam: ?Physical Exam ?Vitals and nursing note reviewed.  ?Constitutional:   ?   Appearance: Normal appearance.  ?HENT:  ?   Head: Normocephalic and atraumatic.  ?   Mouth/Throat:  ?   Pharynx: Oropharynx is clear.  ?Eyes:  ?   Pupils: Pupils are equal, round, and reactive to light.  ?Cardiovascular:  ?   Rate and Rhythm: Normal rate and regular rhythm.  ?Pulmonary:  ?   Effort: Pulmonary effort is normal.  ?   Breath sounds: Normal breath sounds.  ?Abdominal:  ?   General: Abdomen is flat.  ?   Palpations: Abdomen is soft.  ?Musculoskeletal:     ?   General: Normal range of motion.  ?Skin: ?   General: Skin is warm and dry.  ?Neurological:  ?   General: No focal deficit present.  ?   Mental Status: He is alert. Mental status is at baseline.  ?Psychiatric:     ?   Attention and Perception: He is inattentive.     ?   Mood and Affect: Affect is inappropriate.     ?   Speech: He is noncommunicative.  ? ?Review of Systems  ?Unable to perform ROS: Language  ?Blood pressure 128/86, pulse (!) 109, temperature 98.1 ?F (36.7 ?C), temperature source Oral, resp. rate 18, SpO2 100 %. There is no height or weight on file to calculate BMI. ? ? ?Treatment Plan Summary: ?Medication management and Plan I think he is probably a little better on the Clozapine.  Still has  his moments of agitation but has not been violent or aggressive the last day or 2.  Interacting well with the one-on-one who is with him.  No indication to change medicines today.  Reassured him that staff is still trying to u

## 2022-03-21 NOTE — Progress Notes (Signed)
Patient awake and appears in a pleasant mood. Compliant with breakfast and medications. Patient let staff do his vital signs. 1:1 sitter with patient. ?

## 2022-03-21 NOTE — Progress Notes (Signed)
Patient appears little upset because he don't have his phone.Patient states " no " for any questions. But after talks to his mother patient appears happy and went outside to the courtyard. Patient appropriate with staff & peers. Continue with 1:1 sitter. Support and encouragement given. ?

## 2022-03-22 DIAGNOSIS — S069X0S Unspecified intracranial injury without loss of consciousness, sequela: Secondary | ICD-10-CM | POA: Diagnosis not present

## 2022-03-22 DIAGNOSIS — R4689 Other symptoms and signs involving appearance and behavior: Secondary | ICD-10-CM | POA: Diagnosis not present

## 2022-03-22 NOTE — Progress Notes (Signed)
Patient provided snack. Pleasant and cooperative. In the day room with 1:1 sitter watching TV. ? ? ?C Butler-Nicholson, LPN ?

## 2022-03-22 NOTE — Progress Notes (Signed)
Patient received his QHS meds without incident. Asking for phone some agitation observed. Had sitter call his mom on [facility] phone and patient calmed with that action.  Patients phone has been placed on charger for future request.   ? ? ? ?C Butler-Nicholson, LPN ?

## 2022-03-22 NOTE — Progress Notes (Signed)
Patient in bed with eyes closed, snoring. Will continue to monitor with q 15 minute safety checks. ? ? ? ? ? ?C Butler-Nicholson, LPN ?

## 2022-03-22 NOTE — Plan of Care (Signed)
D- Patient alert and oriented to person and situation. Patient presented in a drowsy mood on assessment. Patient is childlike and only speaks a few words, and he also repeats words that he hears staff say. Patient does not appear to be in any pain or acute distress. This writer is unable to assess if patient is experiencing any signs/symptoms of depression/anxiety or SI/HI/AVH. Patient has not required the use of restraints. ? ?A- Scheduled medications administered to patient, per MD orders. Support and encouragement provided.  Routine safety checks conducted every 15 minutes.  ? ?R- No adverse drug reactions noted. Patient contracts for safety at this time. Patient compliant with medications and treatment plan. Patient receptive, calm, and cooperative. Patient remains safe at this time. ? ?Problem: Safety: ?Goal: Violent Restraint(s) ?Outcome: Progressing ?  ?

## 2022-03-22 NOTE — TOC Progression Note (Addendum)
Transition of Care (TOC) - Progression Note  ? ? ?Patient Details  ?Name: Lathyn Griggs ?MRN: [id number] ?Date of Birth: [birth year] ? ?Transition of Care (TOC) CM/SW Contact  ?Webster Cellar, RN ?Phone Number: ?03/22/2022, 1:31 PM ? ?Clinical Narrative:    ?Called [facility] main number @ [phone] and confirmed contact info for [facility] worker is Amalia Hailey @ [phone] [email or web address]  ? ?Updated secure email sent to [facility] requesting update on placement initiatives.  ? ? ? ? ?  ?  ? ?Expected Discharge Plan and Services ?  ?  ?  ?  ?  ?                ?  ?  ?  ?  ?  ?  ?  ?  ?  ?  ? ? ?Social Determinants of Health (SDOH) Interventions ?  ? ?Readmission Risk Interventions ?   ? View : No data to display.  ?  ?  ?  ? ? ?

## 2022-03-22 NOTE — Progress Notes (Signed)
Patient is yelling out "book", and when staff was trying to figure out what he was trying to say, he started saying "no" and getting agitated. This Probation officer gave patient PRN Benadryl and Haldol for agitation. ?
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
## 2022-03-22 NOTE — TOC Progression Note (Signed)
Transition of Care (TOC) - Progression Note  ? ? ?Patient Details  ?Name: Spyridon Hornstein ?MRN: [id number] ?Date of Birth: [birth year] ? ?Transition of Care (TOC) CM/SW Contact  ?Spaulding Cellar, RN ?Phone Number: ?03/22/2022, 12:59 PM ? ?Clinical Narrative:    ?LVMM for Program manager Love Bruener [phone] updating patient had been moved to [facility] and had greatly improved behaviors. Updated that numerous voicemails had been left with no reply.  ? ? ?  ?  ? ?Expected Discharge Plan and Services ?  ?  ?  ?  ?  ?                ?  ?  ?  ?  ?  ?  ?  ?  ?  ?  ? ? ?Social Determinants of Health (SDOH) Interventions ?  ? ?Readmission Risk Interventions ?   ? View : No data to display.  ?  ?  ?  ? ? ?

## 2022-03-22 NOTE — Group Note (Signed)
[facility] LCSW Group Therapy Note ? ? ?Group Date: 03/22/2022 ?Start Time: 1300 ?End Time: 1400 ? ? ?Type of Therapy/Topic:  Group Therapy:  Balance in Life ? ?Participation Level:  Did Not Attend  ? ?Description of Group:   ? This group will address the concept of balance and how it feels and looks when one is unbalanced. Patients will be encouraged to process areas in their lives that are out of balance, and identify reasons for remaining unbalanced. Facilitators will guide patients utilizing problem- solving interventions to address and correct the stressor making their life unbalanced. Understanding and applying boundaries will be explored and addressed for obtaining  and maintaining a balanced life. Patients will be encouraged to explore ways to assertively make their unbalanced needs known to significant others in their lives, using other group members and facilitator for support and feedback. ? ?Therapeutic Goals: ?Patient will identify two or more emotions or situations they have that consume much of in their lives. ?Patient will identify signs/triggers that life has become out of balance:  ?Patient will identify two ways to set boundaries in order to achieve balance in their lives:  ?Patient will demonstrate ability to communicate their needs through discussion and/or role plays ? ?Summary of Patient Progress: ? ? ? ?Patient on back hall due to agitation protocol, not appropriate for group setting at this time.  ? ? ? ?Therapeutic Modalities:   ?Cognitive Behavioral Therapy ?Solution-Focused Therapy ?Assertiveness Training ? ? ?Corky Crafts, LCSWA ?

## 2022-03-22 NOTE — Progress Notes (Signed)
This Clinical research associate held patient's scheduled Haldol. Patient received a PRN dose of Haldol at 1421. This Clinical research associate reached out to MD, via secure chat, regarding this matter, and MD stated if patient was calm, to hold this dose. Patient was in fact calm, and his safety sitter stated that the PRN dose was effective. So, this Clinical research associate held patient's 1700 dose of Haldol. ?

## 2022-03-22 NOTE — Progress Notes (Signed)
Recreation Therapy Notes ? ?INPATIENT RECREATION THERAPY ASSESSMENT ? ?Patient Details ?Name: Adam [name] ?MRN: [id number] ?DOB: 04-Sep-1961 ?Today's Date: 03/22/2022 ?      ?Information Obtained From: ?Patient (Patient unable) ? ?Able to Participate in Assessment/Interview: ?No ? ?Patient Presentation: ?  ? ?Reason for Admission (Per Patient): ?  ? ?Patient Stressors: ?  ? ?Coping Skills:   ?  ? ?Leisure Interests (2+):  ?  ? ?Frequency of Recreation/Participation: ?  ? ?Awareness of Community Resources:  ?  ? ?Community Resources:  ?  ? ?Current Use: ?  ? ?If no, Barriers?: ?  ? ?Expressed Interest in [employer] Information: ?  ? ?[place] of Residence:  ?  ? ?Patient Main Form of Transportation: ?  ? ?Patient Strengths:  ?  ? ?Patient Identified Areas of Improvement:  ?  ? ?Patient Goal for Hospitalization:  ?  ? ?Current SI (including self-harm):  ?  ? ?Current HI:  ?  ? ?Current AVH: ?  ? ?Staff Intervention Plan: ?  ? ?Consent to Intern Participation: ?  ? ?Treven Holtman ?03/22/2022, 4:18 PM ?

## 2022-03-22 NOTE — Progress Notes (Signed)
Patient supplied with his cell phone that he had requested earlier. Now hyper fixed on needing the charger for it. Observed some agitation as he kept pointing to the wall socket saying "charger".  Once phone had loaded and was operational he calmed down. Will continue to monitor with q 15 minute safety checks.  ? ? ? ?C Butler-Nicholson, LPN ?

## 2022-03-22 NOTE — Plan of Care (Signed)
  Problem: Safety: Goal: Violent Restraint(s) Outcome: Progressing   

## 2022-03-22 NOTE — Progress Notes (Signed)
1:1 Patient Hourly Rounding ? ?0800: Patient is in bed, sleeping, with his assigned safety sitter present. ? ?0900: Patient is in the dayroom, eating breakfast, with his assigned safety sitter present. ? ?1000: Patient is in bed, sleeping, with his assigned safety sitter present at bedside. ? ?1100: Patient is laying in bed, awake, with his assigned safety sitter present at bedside. ? ?1200: Patient is in the dayroom, with his assigned safety sitter present. ? ?1300: Patient is in bed, with his assigned safety sitter present at bedside. ? ?1400: Patient is in the dayroom, with his assigned safety sitter present. ? ?1500: Patient is in his room, with his assigned safety sitter present. ? ?1600: Patient is in his bedroom, awake, with his assigned safety sitter present at bedside. ? ?1700: Patient is in the dayroom, eating dinner, with his assigned safety sitter present.  ? ?1800: Patient is outside, in the courtyard, with staff and other members on the unit. Patient's safety sitter I present at his side. ? ?1900: Patient is in the dayroom, watching television, with his assigned Recruitment consultant. ? ? ?

## 2022-03-22 NOTE — Progress Notes (Signed)
[facility] MD Progress Note ? ?03/22/2022 3:44 PM ?Adam [name]  ?MRN:  [id number] ?Subjective: Following up 61 year old man with history of head injury and reportedly history of schizophrenia.  Patient as usual and is not able to vocalize or express himself with language but his behavior has been relatively calm today.  He has not been shouting or banging on things.  He has been cooperative with nursing staff.  Staff are getting used to the patient's routine and are learning to work with him ?Principal Problem: Difficulty controlling behavior as late effect of traumatic brain injury (HCC) ?Diagnosis: Principal Problem: ?  Difficulty controlling behavior as late effect of traumatic brain injury (HCC) ?Active Problems: ?  Schizophrenia, chronic condition with acute exacerbation (HCC) ?  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 ?Total Time spent with patient: 30 minutes ? ?Past Psychiatric History: Past history of chronic cognitive and behavior problems ? ?Past Medical History:  ?Past Medical History:  ?Diagnosis Date  ? Myocardial infarction [facility])   ? Schizophrenia (HCC)   ? Stroke [facility])   ? TBI (traumatic brain injury) (HCC)   ? History reviewed. No pertinent surgical history. ?Family History: History reviewed. No pertinent family history. ?Family Psychiatric  History: See previous ?Social History:  ?Social History  ? ?Substance and Sexual Activity  ?Alcohol Use Not Currently  ?   ?Social History  ? ?Substance and Sexual Activity  ?Drug Use Not Currently  ?  ?Social History  ? ?Socioeconomic History  ? Marital status: Single  ?  Spouse name: Not on file  ? Number of children: Not on file  ? Years of education: Not on file  ? Highest education level: Not on file  ?Occupational History  ? Not on file  ?Tobacco Use  ? Smoking status: Never  ?  Passive exposure: Never  ? Smokeless tobacco: Never  ?Vaping Use  ? Vaping Use: Unknown  ?Substance and Sexual Activity  ? Alcohol use: Not Currently  ?  Drug use: Not Currently  ? Sexual activity: Not Currently  ?Other Topics Concern  ? Not on file  ?Social History Narrative  ? Not on file  ? ?Social Determinants of Health  ? ?Financial Resource Strain: Not on file  ?Food Insecurity: Not on file  ?Transportation Needs: Not on file  ?Physical Activity: Not on file  ?Stress: Not on file  ?Social Connections: Not on file  ? ?Additional Social History:  ?  ?  ?  ?  ?  ?  ?  ?  ?  ?  ?  ? ?Sleep: Fair ? ?Appetite:  Fair ? ?Current Medications: ?Current Facility-Administered Medications  ?Medication Dose Route Frequency Provider Last Rate Last Admin  ? acetaminophen (TYLENOL) tablet 650 mg  650 mg Oral Q6H PRN Malak Orantes T, MD      ? alum & mag hydroxide-simeth (MAALOX/MYLANTA) 200-200-20 MG/5ML suspension 30 mL  30 mL Oral Q4H PRN Madison Albea T, MD      ? aspirin EC tablet 81 mg  81 mg Oral Daily Bauer Ausborn, [name], MD   81 mg at 03/22/22 0844  ? atorvastatin (LIPITOR) tablet 20 mg  20 mg Oral Daily Tanaja Ganger, [name], MD   20 mg at 03/22/22 0844  ? benztropine (COGENTIN) tablet 0.5 mg  0.5 mg Oral BID Mellissa Conley, [name], MD   0.5 mg at 03/22/22 0844  ? clonazePAM (KLONOPIN) tablet 1 mg  1 mg Oral TID Cirilo Canner, [name], MD   1 mg at  03/22/22 1218  ? cloZAPine (CLOZARIL) tablet 300 mg  300 mg Oral QHS Marvin Maenza, [name], MD   300 mg at 03/21/22 2106  ? diphenhydrAMINE (BENADRYL) capsule 50 mg  50 mg Oral Q6H PRN Asuna Peth, [name], MD   50 mg at 03/22/22 1421  ? Or  ? diphenhydrAMINE (BENADRYL) injection 50 mg  50 mg Intramuscular Q6H PRN Ajamu Maxon T, MD      ? gabapentin (NEURONTIN) capsule 300 mg  300 mg Oral TID Nadezhda Pollitt T, MD   300 mg at 03/22/22 1218  ? haloperidol (HALDOL) tablet 5 mg  5 mg Oral TID Ashan Cueva, [name], MD   5 mg at 03/22/22 1218  ? haloperidol (HALDOL) tablet 5 mg  5 mg Oral Q6H PRN Gerrard Crystal, [name], MD   5 mg at 03/22/22 1421  ? Or  ? haloperidol lactate (HALDOL) injection 5 mg  5 mg Intramuscular Q6H PRN Deardra Hinkley T, MD      ? magnesium hydroxide  (MILK OF MAGNESIA) suspension 30 mL  30 mL Oral Daily PRN Tykisha Areola T, MD      ? metoprolol succinate (TOPROL-XL) 24 hr tablet 25 mg  25 mg Oral Daily Darian Cansler, [name], MD   25 mg at 03/22/22 0844  ? [START ON 03/29/2022] paliperidone (INVEGA SUSTENNA) injection 156 mg  156 mg Intramuscular Q28 days Zerenity Bowron T, MD      ? temazepam (RESTORIL) capsule 15 mg  15 mg Oral QHS Siler Mavis T, MD   15 mg at 03/21/22 2106  ? ziprasidone (GEODON) injection 20 mg  20 mg Intramuscular Q12H PRN Payam Gribble, [name], MD   20 mg at 03/19/22 1551  ? ? ?Lab Results:  ?Results for orders placed or performed during the hospital encounter of 03/19/22 (from the past 48 hour(s))  ?CBC with Differential/Platelet     Status: Abnormal  ? Collection Time: 03/21/22  5:32 PM  ?Result Value Ref Range  ? WBC 5.8 4.0 - 10.5 K/uL  ? RBC 4.30 4.22 - 5.81 MIL/uL  ? Hemoglobin 12.9 (L) 13.0 - 17.0 g/dL  ? HCT 39.4 39.0 - 52.0 %  ? MCV 91.6 80.0 - 100.0 fL  ? MCH 30.0 26.0 - 34.0 pg  ? MCHC 32.7 30.0 - 36.0 g/dL  ? RDW 13.2 11.5 - 15.5 %  ? Platelets 216 150 - 400 K/uL  ? nRBC 0.0 0.0 - 0.2 %  ? Neutrophils Relative % 67 %  ? Neutro Abs 3.9 1.7 - 7.7 K/uL  ? Lymphocytes Relative 24 %  ? Lymphs Abs 1.4 0.7 - 4.0 K/uL  ? Monocytes Relative 7 %  ? Monocytes Absolute 0.4 0.1 - 1.0 K/uL  ? Eosinophils Relative 0 %  ? Eosinophils Absolute 0.0 0.0 - 0.5 K/uL  ? Basophils Relative 2 %  ? Basophils Absolute 0.1 0.0 - 0.1 K/uL  ? Immature Granulocytes 0 %  ? Abs Immature Granulocytes 0.01 0.00 - 0.07 K/uL  ?  Comment: Performed at [facility], [address] [place], [place] [address]  ? ? ?Blood Alcohol level:  ?Lab Results  ?Component Value Date  ? ETH <10 07/20/2021  ? ? ?Metabolic Disorder Labs: ?No results found for: HGBA1C, MPG ?No results found for: PROLACTIN ?Lab Results  ?Component Value Date  ? CHOL 167 11/20/2021  ? TRIG 107 11/20/2021  ? HDL 26 (L) 11/20/2021  ? CHOLHDL 6.4 11/20/2021  ? VLDL 21 11/20/2021  ? LDLCALC 120 (H)  11/20/2021  ? ? ?  Physical Findings: ?AIMS:  , ,  ,  ,    ?CIWA:    ?COWS:    ? ?Musculoskeletal: ?Strength & Muscle Tone: within normal limits ?Gait & Station: normal ?Patient leans: N/A ? ?Psychiatric Specialty Exam: ? ?Presentation  ?General Appearance: Disheveled ? ?Eye Contact:None ? ?Speech:Clear and Coherent ? ?Speech Volume:Increased ? ?Handedness:Right ? ? ?Mood and Affect  ?Mood:Anxious; Irritable ? ?Affect:Congruent ? ? ?Thought Process  ?Thought Processes:Disorganized ? ?Descriptions of Associations:Loose ? ?Orientation:Partial ? ?Thought Content:Scattered; Illogical ? ?History of Schizophrenia/Schizoaffective disorder:Yes ? ?Duration of Psychotic Symptoms:Greater than six months ? ?Hallucinations:No data recorded ?Ideas of Reference:-- (UTA) ? ?Suicidal Thoughts:No data recorded ?Homicidal Thoughts:No data recorded ? ?Sensorium  ?Memory:Remote Poor; Recent Poor; Immediate Poor; Other (comment) (impaired memory and cognition) ? ?Judgment:Impaired ? ?Insight:Lacking ? ? ?Executive Functions  ?Concentration:Poor ? ?Attention Span:Poor ? ?Recall:Poor ? ?Fund of Knowledge:Poor ? ?Language:Poor ? ? ?Psychomotor Activity  ?Psychomotor Activity:No data recorded ? ?Assets  ?Assets:Housing; Resilience; Social Support; Talents/Skills ? ? ?Sleep  ?Sleep:No data recorded ? ? ?Physical Exam: ?Physical Exam ?Vitals and nursing note reviewed.  ?Constitutional:   ?   Appearance: Normal appearance.  ?HENT:  ?   Head: Normocephalic and atraumatic.  ?   Mouth/Throat:  ?   Pharynx: Oropharynx is clear.  ?Eyes:  ?   Pupils: Pupils are equal, round, and reactive to light.  ?Cardiovascular:  ?   Rate and Rhythm: Normal rate and regular rhythm.  ?Pulmonary:  ?   Effort: Pulmonary effort is normal.  ?   Breath sounds: Normal breath sounds.  ?Abdominal:  ?   General: Abdomen is flat.  ?   Palpations: Abdomen is soft.  ?Musculoskeletal:     ?   General: Normal range of motion.  ?Skin: ?   General: Skin is warm and dry.   ?Neurological:  ?   General: No focal deficit present.  ?   Mental Status: He is alert. Mental status is at baseline.  ?Psychiatric:     ?   Attention and Perception: He is inattentive.     ?   Mood and Affect: Affect is inap

## 2022-03-22 NOTE — Progress Notes (Signed)
Patient awoke out of sleep yelling. Had wet the bed. Patient washed and change of clothes provided. Bed linens changed.  Patient is agitated has he holds up  his picture list and is pointing to his phone.  Will continue to monitor with q 15 minute safety checks and will inquire about his phone.  ? ? ? ?C Butler-Nicholson, LPN ? ?

## 2022-03-22 NOTE — Progress Notes (Signed)
Patient in bed with eyes closed, snoring. Will continue to monitor with q 15 minute safety checks. ?  ?  ?  ?  ?  ?C Butler-Nicholson, LPN ?

## 2022-03-22 NOTE — Progress Notes (Signed)
Patient motioned for a mask from this writer, on his way outside to the courtyard. A mask was given to him and he proceeded to go outside, with his safety sitter present. Patient remains safe on the unit. ?

## 2022-03-22 NOTE — Progress Notes (Signed)
Recreation Therapy Notes ? ?INPATIENT RECREATION TR PLAN ? ?Patient Details ?Name: Adam [name] ?MRN: [id number] ?DOB: Feb 11, 1961 ?Today's Date: 03/22/2022 ? ?Rec Therapy Plan ?Is patient appropriate for Therapeutic Recreation?: Yes ?Treatment times per week: at least 3 ?Estimated Length of Stay: 5-7 days ?TR Treatment/Interventions: Group participation (Comment) ? ?Discharge Criteria ?Pt will be discharged from therapy if:: Discharged ?Treatment plan/goals/alternatives discussed and agreed upon by:: Patient/family ? ?Discharge Summary ?  ? ? ?Khadir Roam ?03/22/2022, 4:18 PM ?

## 2022-03-22 NOTE — Progress Notes (Signed)
Patient in bed asleep.  Vitals obtained while asleep. Oxygen level registered 85 on Dinamap. Machine has been faulty with readings. Will have readings rechecked to insure readings are normal.  ? ? ? ?C Butler-Nicholson, LPN  ?

## 2022-03-22 NOTE — Progress Notes (Addendum)
Patient in bed with eyes closed, snoring. Will continue to monitor with q 15 minute safety checks. ? ? ? ? ? ?C Butler-Nicholson, LPN ?

## 2022-03-22 NOTE — Progress Notes (Signed)
Patient in room scrolling through cell phone in no distress, states there was nothing on television. Calm and cooperative at present. Sitter at bedside for safety. No concerns voiced. Patient remains safe on unit.  ?

## 2022-03-23 DIAGNOSIS — R4689 Other symptoms and signs involving appearance and behavior: Secondary | ICD-10-CM | POA: Diagnosis not present

## 2022-03-23 DIAGNOSIS — S069X0S Unspecified intracranial injury without loss of consciousness, sequela: Secondary | ICD-10-CM | POA: Diagnosis not present

## 2022-03-23 NOTE — Plan of Care (Signed)
D- Patient alert and oriented to person and situation. Patient slept in later this morning, voicing no complaints at the time. Patient is childlike and only speaks a few words. When this writer asked patient is he was experiencing any signs/symptoms of depression/anxiety, he said "no". Patient does not appear to be in any pain or acute distress. This writer is unable to assess if patient is experiencing any SI/HI/AVH. Patient has not required the use of any restraints today. ? ?A- Scheduled medications administered to patient, per MD orders. Support and encouragement provided.  Routine safety checks conducted every 15 minutes.  Patient informed to notify staff with problems or concerns. ? ?R- No adverse drug reactions noted. Patient compliant with medications and treatment plan. Patient receptive, calm, and cooperative. Patient remains safe at this time. ? ?Problem: Safety: ?Goal: Violent Restraint(s) ?Outcome: Progressing ?  ?

## 2022-03-23 NOTE — Progress Notes (Signed)
Uneventful night. Patient slept all night. Medication compliant. No interaction with peers. Pleasant and cooperative.  ?Remains 1:1 with sitter for safety. Encouragement and support provided. Safety checks maintained. Medication given as prescribed. Pt receptive and remains safe on unit with q 15 min checks. ?

## 2022-03-23 NOTE — Progress Notes (Signed)
Patient in bed resting, eyes closed in no distress. Remains with sitter for safety.  ?

## 2022-03-23 NOTE — Progress Notes (Signed)
1:1 Patient Hourly Rounding ? ?0800: Patient is in bed, sleeping, with his assigned safety sitter present. ? ?0900: Patient is still sleeping, with his assigned safety sitter present. ? ?1000: Patient is in bed, sleeping, with his assigned safety sitter patient. ? ?1100: Patient is in bed, awake, with his assigned safety sitter present. ? ?1200: Patient is in bed, asleep, with his assigned safety sitter present at bedside. ? ?1300: Patient is in bed, awake, with his assigned safety sitter present. ? ?1400: Patient is in bed, awake, with his assigned safety sitter present. ? ?1500: Patient is standing in the hallway, looking out of the double doors, with his assigned safety sitter present at bedside. ? ?1600: Patient is outside, in the courtyard, with staff and other members on the unit. Patient's assigned safety sitter is present as well. ? ?1700: Patient is in bed, awake, with his assigned safety sitter present. ? ?1800: Patient is in the bathroom, with his assigned safety sitter present. ? ?1900: Patient is in the dayroom, watching television and walking around, with his assigned safety sitter present. ? ?

## 2022-03-23 NOTE — Progress Notes (Signed)
[facility] MD Progress Note ? ?03/23/2022 2:57 PM ?Adam [name]  ?MRN:  [id number] ?Subjective: Follow-up 61 year old man with a history of severe stroke with late behavior problems and a prior history of schizophrenia.  Patient himself is indicating no specific new complaint.  Behavior has been reasonably controlled today.  I had a nice conversation with his mother today.  The specific information that she could provide was that his stroke occurred about 7 years ago and that his primary treatment was at [facility] in [place]. ?Principal Problem: Difficulty controlling behavior as late effect of traumatic brain injury (HCC) ?Diagnosis: Principal Problem: ?  Difficulty controlling behavior as late effect of traumatic brain injury (HCC) ?Active Problems: ?  Schizophrenia, chronic condition with acute exacerbation (HCC) ?  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 ?Total Time spent with patient: 30 minutes ? ?Past Psychiatric History: Past history of longstanding chronic behavior problems since his stroke ? ?Past Medical History:  ?Past Medical History:  ?Diagnosis Date  ? Myocardial infarction [facility])   ? Schizophrenia (HCC)   ? Stroke [facility])   ? TBI (traumatic brain injury) (HCC)   ? History reviewed. No pertinent surgical history. ?Family History: History reviewed. No pertinent family history. ?Family Psychiatric  History: See previous ?Social History:  ?Social History  ? ?Substance and Sexual Activity  ?Alcohol Use Not Currently  ?   ?Social History  ? ?Substance and Sexual Activity  ?Drug Use Not Currently  ?  ?Social History  ? ?Socioeconomic History  ? Marital status: Single  ?  Spouse name: Not on file  ? Number of children: Not on file  ? Years of education: Not on file  ? Highest education level: Not on file  ?Occupational History  ? Not on file  ?Tobacco Use  ? Smoking status: Never  ?  Passive exposure: Never  ? Smokeless tobacco: Never  ?Vaping Use  ? Vaping Use: Unknown   ?Substance and Sexual Activity  ? Alcohol use: Not Currently  ? Drug use: Not Currently  ? Sexual activity: Not Currently  ?Other Topics Concern  ? Not on file  ?Social History Narrative  ? Not on file  ? ?Social Determinants of Health  ? ?Financial Resource Strain: Not on file  ?Food Insecurity: Not on file  ?Transportation Needs: Not on file  ?Physical Activity: Not on file  ?Stress: Not on file  ?Social Connections: Not on file  ? ?Additional Social History:  ?  ?  ?  ?  ?  ?  ?  ?  ?  ?  ?  ? ?Sleep: Fair ? ?Appetite:  Fair ? ?Current Medications: ?Current Facility-Administered Medications  ?Medication Dose Route Frequency Provider Last Rate Last Admin  ? acetaminophen (TYLENOL) tablet 650 mg  650 mg Oral Q6H PRN Hannah Crill T, MD      ? alum & mag hydroxide-simeth (MAALOX/MYLANTA) 200-200-20 MG/5ML suspension 30 mL  30 mL Oral Q4H PRN Reshma Hoey T, MD      ? aspirin EC tablet 81 mg  81 mg Oral Daily Lyndee Herbst T, MD   81 mg at 03/23/22 1030  ? atorvastatin (LIPITOR) tablet 20 mg  20 mg Oral Daily Ramiel Forti, [name], MD   20 mg at 03/23/22 1030  ? benztropine (COGENTIN) tablet 0.5 mg  0.5 mg Oral BID Avis Tirone, [name], MD   0.5 mg at 03/23/22 1029  ? clonazePAM (KLONOPIN) tablet 1 mg  1 mg Oral TID Starling Christofferson, [name], MD  1 mg at 03/23/22 1029  ? cloZAPine (CLOZARIL) tablet 300 mg  300 mg Oral QHS Unique Searfoss, [name], MD   300 mg at 03/22/22 2113  ? diphenhydrAMINE (BENADRYL) capsule 50 mg  50 mg Oral Q6H PRN Divine Hansley, [name], MD   50 mg at 03/22/22 1421  ? Or  ? diphenhydrAMINE (BENADRYL) injection 50 mg  50 mg Intramuscular Q6H PRN Alayssa Flinchum T, MD      ? gabapentin (NEURONTIN) capsule 300 mg  300 mg Oral TID Bonne Whack T, MD   300 mg at 03/23/22 1030  ? haloperidol (HALDOL) tablet 5 mg  5 mg Oral TID Cydnie Deason, [name], MD   5 mg at 03/23/22 1030  ? haloperidol (HALDOL) tablet 5 mg  5 mg Oral Q6H PRN Bruin Bolger, [name], MD   5 mg at 03/22/22 1421  ? Or  ? haloperidol lactate (HALDOL) injection 5 mg  5 mg  Intramuscular Q6H PRN Sarahlynn Cisnero T, MD      ? magnesium hydroxide (MILK OF MAGNESIA) suspension 30 mL  30 mL Oral Daily PRN Romin Divita T, MD      ? metoprolol succinate (TOPROL-XL) 24 hr tablet 25 mg  25 mg Oral Daily Mickayla Trouten T, MD   25 mg at 03/23/22 1030  ? [START ON 03/29/2022] paliperidone (INVEGA SUSTENNA) injection 156 mg  156 mg Intramuscular Q28 days Jonica Bickhart T, MD      ? temazepam (RESTORIL) capsule 15 mg  15 mg Oral QHS Kealohilani Maiorino T, MD   15 mg at 03/22/22 2113  ? ziprasidone (GEODON) injection 20 mg  20 mg Intramuscular Q12H PRN Kristine Chahal, [name], MD   20 mg at 03/19/22 1551  ? ? ?Lab Results:  ?Results for orders placed or performed during the hospital encounter of 03/19/22 (from the past 48 hour(s))  ?CBC with Differential/Platelet     Status: Abnormal  ? Collection Time: 03/21/22  5:32 PM  ?Result Value Ref Range  ? WBC 5.8 4.0 - 10.5 K/uL  ? RBC 4.30 4.22 - 5.81 MIL/uL  ? Hemoglobin 12.9 (L) 13.0 - 17.0 g/dL  ? HCT 39.4 39.0 - 52.0 %  ? MCV 91.6 80.0 - 100.0 fL  ? MCH 30.0 26.0 - 34.0 pg  ? MCHC 32.7 30.0 - 36.0 g/dL  ? RDW 13.2 11.5 - 15.5 %  ? Platelets 216 150 - 400 K/uL  ? nRBC 0.0 0.0 - 0.2 %  ? Neutrophils Relative % 67 %  ? Neutro Abs 3.9 1.7 - 7.7 K/uL  ? Lymphocytes Relative 24 %  ? Lymphs Abs 1.4 0.7 - 4.0 K/uL  ? Monocytes Relative 7 %  ? Monocytes Absolute 0.4 0.1 - 1.0 K/uL  ? Eosinophils Relative 0 %  ? Eosinophils Absolute 0.0 0.0 - 0.5 K/uL  ? Basophils Relative 2 %  ? Basophils Absolute 0.1 0.0 - 0.1 K/uL  ? Immature Granulocytes 0 %  ? Abs Immature Granulocytes 0.01 0.00 - 0.07 K/uL  ?  Comment: Performed at [facility], [address] [place], [place] [address]  ? ? ?Blood Alcohol level:  ?Lab Results  ?Component Value Date  ? ETH <10 07/20/2021  ? ? ?Metabolic Disorder Labs: ?No results found for: HGBA1C, MPG ?No results found for: PROLACTIN ?Lab Results  ?Component Value Date  ? CHOL 167 11/20/2021  ? TRIG 107 11/20/2021  ? HDL 26 (L) 11/20/2021  ?  CHOLHDL 6.4 11/20/2021  ? VLDL 21 11/20/2021  ? LDLCALC  120 (H) 11/20/2021  ? ? ?Physical Findings: ?AIMS:  , ,  ,  ,    ?CIWA:    ?COWS:    ? ?Musculoskeletal: ?Strength & Muscle Tone: within normal limits ?Gait & Station: normal ?Patient leans: N/A ? ?Psychiatric Specialty Exam: ? ?Presentation  ?General Appearance: Disheveled ? ?Eye Contact:None ? ?Speech:Clear and Coherent ? ?Speech Volume:Increased ? ?Handedness:Right ? ? ?Mood and Affect  ?Mood:Anxious; Irritable ? ?Affect:Congruent ? ? ?Thought Process  ?Thought Processes:Disorganized ? ?Descriptions of Associations:Loose ? ?Orientation:Partial ? ?Thought Content:Scattered; Illogical ? ?History of Schizophrenia/Schizoaffective disorder:Yes ? ?Duration of Psychotic Symptoms:Greater than six months ? ?Hallucinations:No data recorded ?Ideas of Reference:-- (UTA) ? ?Suicidal Thoughts:No data recorded ?Homicidal Thoughts:No data recorded ? ?Sensorium  ?Memory:Remote Poor; Recent Poor; Immediate Poor; Other (comment) (impaired memory and cognition) ? ?Judgment:Impaired ? ?Insight:Lacking ? ? ?Executive Functions  ?Concentration:Poor ? ?Attention Span:Poor ? ?Recall:Poor ? ?Fund of Knowledge:Poor ? ?Language:Poor ? ? ?Psychomotor Activity  ?Psychomotor Activity:No data recorded ? ?Assets  ?Assets:Housing; Resilience; Social Support; Talents/Skills ? ? ?Sleep  ?Sleep:No data recorded ? ? ?Physical Exam: ?Physical Exam ?Vitals and nursing note reviewed.  ?Constitutional:   ?   Appearance: Normal appearance.  ?HENT:  ?   Head: Normocephalic and atraumatic.  ?   Mouth/Throat:  ?   Pharynx: Oropharynx is clear.  ?Eyes:  ?   Pupils: Pupils are equal, round, and reactive to light.  ?Cardiovascular:  ?   Rate and Rhythm: Normal rate and regular rhythm.  ?Pulmonary:  ?   Effort: Pulmonary effort is normal.  ?   Breath sounds: Normal breath sounds.  ?Abdominal:  ?   General: Abdomen is flat.  ?   Palpations: Abdomen is soft.  ?Musculoskeletal:     ?   General: Normal range of  motion.  ?Skin: ?   General: Skin is warm and dry.  ?Neurological:  ?   General: No focal deficit present.  ?   Mental Status: He is alert. Mental status is at baseline.  ?Psychiatric:     ?   Attention and Percep

## 2022-03-23 NOTE — Progress Notes (Signed)
Patient received his morning medication at 1030. This Clinical research associate notified MD, and it was decided by MD that if patient remains calm, this writer will hold noon medication. ?

## 2022-03-23 NOTE — Plan of Care (Signed)
  Problem: Safety: Goal: Violent Restraint(s) Outcome: Progressing   

## 2022-03-23 NOTE — Progress Notes (Signed)
Patient is still sleeping, so this writer will administer medication once he wakes up. MD will be notified. ?

## 2022-03-23 NOTE — Progress Notes (Signed)
MD is ok with patient receiving his medication after he wakes up. ?

## 2022-03-23 NOTE — Progress Notes (Signed)
Recreation Therapy Notes ? ? ?Date: 03/23/2022  ?  ?Time: 10:45am  ?  ?Location: Courtyard  ?  ?Behavioral response: Appropriate ?  ?Intervention Topic:  Social Skills  ?  ?Discussion/Intervention:  ?Group content on today was focused on social skills. The group defined social skills and identified ways they use social skills. Patients expressed what obstacles they face when trying to be social. Participants described the importance of social skills. The group listed ways to improve social skills and reasons to improve social skills. Individuals had an opportunity to learn new and improve social skills as well as identify their weaknesses. ?  ?Clinical Observations/Feedback: ?Patient came to group and was pleasant and cooperative during group. Individual was social with peers and staff while participating in the intervention. Walking in from the courtyard patient yelled out "NO"  when another patient tried to talk to the NT assigned to him. Redirection needed and provided from NT.  ?Rosamary Boudreau LRT/CTRS  ?  ? ? ? ? ? ? ? ?Bonne Whack ?03/23/2022 12:09 PM ?

## 2022-03-24 DIAGNOSIS — R4689 Other symptoms and signs involving appearance and behavior: Secondary | ICD-10-CM | POA: Diagnosis not present

## 2022-03-24 DIAGNOSIS — S069X0S Unspecified intracranial injury without loss of consciousness, sequela: Secondary | ICD-10-CM | POA: Diagnosis not present

## 2022-03-24 NOTE — Progress Notes (Signed)
[facility] MD Progress Note ? ?03/24/2022 10:21 AM ?Phillips Climes  ?MRN:  [id number] ?Subjective:  ?According to patient's one-to-one sitter, he woke up just fine today, in a calm manner.  He is occasionally irritable upon awakening some days.  He is taking his medications.  I am told that his phone is calming for him.  He gives me a thumbs up while smiling denies any stressors or concerns.  Indicates that he slept well last night.  Tells me that he loves "[place]."  When asked what he needs today, he replies "freedom." ? ?Per nursing report, pain patient tends to wake up late, round 1030 and so gets his morning medications later. ? ?Principal Problem: Difficulty controlling behavior as late effect of traumatic brain injury ([place]) ?Diagnosis: Principal Problem: ?  Difficulty controlling behavior as late effect of traumatic brain injury ([place]) ?Active Problems: ?  Schizophrenia, chronic condition with acute exacerbation ([place]) ?  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 ?Total Time spent with patient: 30 minutes ? ?Past Psychiatric History: Past history of longstanding chronic behavior problems since his stroke ? ?Past Medical History:  ?Past Medical History:  ?Diagnosis Date  ? Myocardial infarction [facility])   ? Schizophrenia ([place])   ? Stroke [facility])   ? TBI (traumatic brain injury) ([place])   ? History reviewed. No pertinent surgical history. ?Family History: History reviewed. No pertinent family history. ?Family Psychiatric  History: See previous ?Social History:  ?Social History  ? ?Substance and Sexual Activity  ?Alcohol Use Not Currently  ?   ?Social History  ? ?Substance and Sexual Activity  ?Drug Use Not Currently  ?  ?Social History  ? ?Socioeconomic History  ? Marital status: Single  ?  Spouse name: Not on file  ? Number of children: Not on file  ? Years of education: Not on file  ? Highest education level: Not on file  ?Occupational History  ? Not on file  ?Tobacco Use  ? Smoking status: Never   ?  Passive exposure: Never  ? Smokeless tobacco: Never  ?Vaping Use  ? Vaping Use: Unknown  ?Substance and Sexual Activity  ? Alcohol use: Not Currently  ? Drug use: Not Currently  ? Sexual activity: Not Currently  ?Other Topics Concern  ? Not on file  ?Social History Narrative  ? Not on file  ? ?Social Determinants of Health  ? ?Financial Resource Strain: Not on file  ?Food Insecurity: Not on file  ?Transportation Needs: Not on file  ?Physical Activity: Not on file  ?Stress: Not on file  ?Social Connections: Not on file  ? ?Additional Social History:  ?  ?  ?  ?  ?  ?  ?  ?  ?  ?  ?  ? ?Sleep: Fair ? ?Appetite:  Fair ? ?Current Medications: ?Current Facility-Administered Medications  ?Medication Dose Route Frequency Provider Last Rate Last Admin  ? acetaminophen (TYLENOL) tablet 650 mg  650 mg Oral Q6H PRN [name], [name], MD      ? alum & mag hydroxide-simeth (MAALOX/MYLANTA) 200-200-20 MG/5ML suspension 30 mL  30 mL Oral Q4H PRN [name], [name], MD      ? aspirin EC tablet 81 mg  81 mg Oral Daily [name], [name], MD   81 mg at 03/24/22 1000  ? atorvastatin (LIPITOR) tablet 20 mg  20 mg Oral Daily [name], [name], MD   20 mg at 03/24/22 1001  ? benztropine (COGENTIN) tablet 0.5 mg  0.5 mg Oral  BID [name], [name], MD   0.5 mg at 03/24/22 1001  ? clonazePAM (KLONOPIN) tablet 1 mg  1 mg Oral TID [name], [name], MD   1 mg at 03/24/22 1001  ? cloZAPine (CLOZARIL) tablet 300 mg  300 mg Oral QHS [name], [name], MD   300 mg at 03/23/22 2139  ? diphenhydrAMINE (BENADRYL) capsule 50 mg  50 mg Oral Q6H PRN [name], [name], MD   50 mg at 03/22/22 1421  ? Or  ? diphenhydrAMINE (BENADRYL) injection 50 mg  50 mg Intramuscular Q6H PRN [name], [name], MD      ? gabapentin (NEURONTIN) capsule 300 mg  300 mg Oral TID [name], [name], MD   300 mg at 03/24/22 1002  ? haloperidol (HALDOL) tablet 5 mg  5 mg Oral TID [name], [name], MD   5 mg at 03/24/22 1002  ? haloperidol (HALDOL) tablet 5 mg  5 mg Oral Q6H PRN [name], [name],  MD   5 mg at 03/22/22 1421  ? Or  ? haloperidol lactate (HALDOL) injection 5 mg  5 mg Intramuscular Q6H PRN [name], [name], MD      ? magnesium hydroxide (MILK OF MAGNESIA) suspension 30 mL  30 mL Oral Daily PRN [name], [name], MD      ? metoprolol succinate (TOPROL-XL) 24 hr tablet 25 mg  25 mg Oral Daily [name], [name], MD   25 mg at 03/24/22 1002  ? [START ON 03/29/2022] paliperidone (INVEGA SUSTENNA) injection 156 mg  156 mg Intramuscular Q28 days [name], [name], MD      ? temazepam (RESTORIL) capsule 15 mg  15 mg Oral QHS [name], [name], MD   15 mg at 03/23/22 2139  ? ziprasidone (GEODON) injection 20 mg  20 mg Intramuscular Q12H PRN [name], [name], MD   20 mg at 03/19/22 1551  ? ? ?Lab Results:  ?No results found for this or any previous visit (from the past 48 hour(s)). ? ? ?Blood Alcohol level:  ?Lab Results  ?Component Value Date  ? ETH <10 07/20/2021  ? ? ?Metabolic Disorder Labs: ?No results found for: HGBA1C, MPG ?No results found for: PROLACTIN ?Lab Results  ?Component Value Date  ? CHOL 167 11/20/2021  ? TRIG 107 11/20/2021  ? HDL 26 (L) 11/20/2021  ? CHOLHDL 6.4 11/20/2021  ? VLDL 21 11/20/2021  ? LDLCALC 120 (H) 11/20/2021  ? ? ?Physical Findings: ?AIMS:  , ,  ,  ,    ?CIWA:    ?COWS:    ? ?Musculoskeletal: ?Strength & Muscle Tone: within normal limits ?Gait & Station: normal ?Patient leans: N/A ? ?Psychiatric Specialty Exam: ? ?Presentation  ?General Appearance: [place] groomed today ?Eye Contact:None ? ?Speech:Clear and Coherent ? ?Speech Volume:Increased ? ?Handedness:Right ? ? ?Mood and Affect  ?Mood:good ?Affect:bright ? ?Thought Process  ?Thought Processes:Disorganized ? ?Descriptions of Associations:Loose ? ?Orientation:Partial ? ?Thought Content:Scattered; Illogical ? ?History of Schizophrenia/Schizoaffective disorder:Yes ? ?Duration of Psychotic Symptoms:Greater than six months ? ?Hallucinations:No data recorded ?Ideas of Reference:-- (UTA) ? ?Suicidal Thoughts:No data recorded ?Homicidal  Thoughts:No data recorded ? ?Sensorium  ?Memory:Remote Poor; Recent Poor; Immediate Poor; Other (comment) (impaired memory and cognition) ? ?Judgment:Impaired ? ?Insight:Lacking ? ? ?Executive Functions  ?Concentration:Poor ? ?Attention Span:Poor ? ?Recall:Poor ? ?Fund of Knowledge:Poor ? ?Language:Poor ? ? ?Psychomotor Activity  ?Psychomotor Activity:No data recorded ? ?Assets  ?Assets:Housing; Resilience; Social Support; Talents/Skills ? ? ?Sleep  ?Sleep:No data recorded ? ? ?Review of Systems  ?  Unable to perform ROS: Language  ?Blood pressure (!) 136/116, pulse 97, temperature 97.6 ?F (36.4 ?C), resp. rate 17, SpO2 (!) 87 %. There is no height or weight on file to calculate BMI. ? ? ?Treatment Plan Summary: ?Medication management and Plan no change to medication.  Patient seems to be reasonably stable right now.  I am going to see if we can find a way to get old records on him from [facility] in [place]. ? ?4/22 ?No changes ? ?Rulon Sera, MD ?03/24/2022, 10:21 AM ? ?

## 2022-03-24 NOTE — Plan of Care (Signed)
D: Pt alert and when asked if he was experiencing any anxiety/depression at this time pt states "no". Pt denies experiencing any pain at this time. Pt denies experiencing any SI/HI, or AVH at this time. ? ?Pt is able to answer yes and no questions. Pt appears calm and cooperative with a pleasant mood at this time.  ? ?Pt received his morning medication at 1000. This Probation officer notified MD, and it was decided to hold noon medication. Pt has remained calm and cooperative. ? ?Pt can become frustrated when unable to communicate needs however easily calms down and is redirectable.  ? ?A: Scheduled medications administered to pt, per MD orders. Support and encouragement provided. Frequent verbal contact made. Routine safety checks conducted q15 minutes as well and continuous 1:1.  ? ?R: No adverse drug reactions noted. Pt verbally contracts for safety at this time. Pt compliant with medications. Pt interacts well with staff on the unit. Pt remains safe at this time. Will continue to monitor.  ? ?Problem: Self-Concept: ?Goal: Level of anxiety will decrease ?Outcome: Progressing ?Goal: Ability to modify response to factors that promote anxiety will improve ?Outcome: Progressing ?  ?

## 2022-03-24 NOTE — Group Note (Signed)
LCSW Group Therapy Note ? ?Group Date: 03/24/2022 ?Start Time: 1315 ?End Time: 1415 ? ? ?Type of Therapy and Topic:  Group Therapy - Healthy vs Unhealthy Coping Skills ? ?Participation Level:  Did Not Attend  ? ?Description of Group ?The focus of this group was to determine what unhealthy coping techniques typically are used by group members and what healthy coping techniques would be helpful in coping with various problems. Patients were guided in becoming aware of the differences between healthy and unhealthy coping techniques. Patients were asked to identify 2-3 healthy coping skills they would like to learn to use more effectively. ? ?Therapeutic Goals ?Patients learned that coping is what human beings do all day long to deal with various situations in their lives ?Patients defined and discussed healthy vs unhealthy coping techniques ?Patients identified their preferred coping techniques and identified whether these were healthy or unhealthy ?Patients determined 2-3 healthy coping skills they would like to become more familiar with and use more often. ?Patients provided support and ideas to each other ? ? ?Summary of Patient Progress: Patient on back hall due to agitation protocol. He is not appropriate for a group setting at this time.  ? ? ?Therapeutic Modalities ?Cognitive Behavioral Therapy ?Motivational Interviewing ? ?Ileana Ladd [place], LCSWA ?03/24/2022  2:49 PM   ?

## 2022-03-24 NOTE — Progress Notes (Signed)
1:1 Patient Hourly Rounding ? ?0800: Patient is in bed sleeping, patient breathing is symmetrical, easy, and unlabored. No signs of distress are noted at this time. Patient's assigned safety sitter is present.  ? ?0900: Patient remain in bed asleep w/o signs of distress. Patient's breathing is symmetrical, easy, and unlabored. No signs of distress are noted at this time. Patients assigned safety sitter is present.  ? ?1000: Patient is alert and sitting on bedside. Pt shows no signs of distress at this time. Pt is calm and cooperative. Patient's assigned safety sitter is present. ? ?11:00: Patient is alert and sitting on bedside. Pt shows no signs of distress at this time. Pt is calm and smiling. Patient's assigned safety sitter is present. ? ?1200: Patient is sleeping in bed. Patient's breathing is symmetrical easy, and unlabored. No signs of distress noted at this time. Patient's assigned safety sitter is present. ? ?1300: Patient is sleeping in bed. Patient's breathing is symmetrical, easy, and unlabored. No signs of distress noted at this time. Patient's assigned safety sitter is present.  ? ?1400: Patient is alert and in room. Pt is calm, composed and does not show any signs of distress at this time. Patient's assigned safety sitter is present. ? ?1500: Patient is standing in hallway area. Pt is requesting makeup and nail polish. Patient's assigned safety sitter is present. ? ?1600: Patient is in room, with his assigned sitter present. ? ?1700: Patient is in room. Patient shows no signs of distress at this time. Patient's assigned safety sitter is present. ? ?1800: Patient is in dayroom watching TV. Patient shows no signs of distress at this time. This writer is sitting and present at this time. ? ?1900: Patient is in room. Patient shows no signs of distress at this time. Unit MHT safety sitting with patient. ?

## 2022-03-24 NOTE — Progress Notes (Signed)
Patient is still sleeping, writer will administer medications upon patient waking up. MD was notified and aware.  ?

## 2022-03-25 DIAGNOSIS — R4689 Other symptoms and signs involving appearance and behavior: Secondary | ICD-10-CM | POA: Diagnosis not present

## 2022-03-25 DIAGNOSIS — S069X0S Unspecified intracranial injury without loss of consciousness, sequela: Secondary | ICD-10-CM | POA: Diagnosis not present

## 2022-03-25 NOTE — Progress Notes (Signed)
Patient on a 1:1 with a sitter present. Pt calm and cooperative. Pt interacting appropriately with staff and peers on the unit. Pt compliant with medication administration per MD orders. Pt remains safe on the unit.  ?

## 2022-03-25 NOTE — Progress Notes (Signed)
Patient was mostly cooperative on shift, he has a 1:1 sitter for safety and redirection. Patient was compliant with medications on shift and no new behavioral issues to report on shift at this time. ?

## 2022-03-25 NOTE — Group Note (Signed)
[facility] LCSW Group Therapy Note ? ? ?Group Date: 03/25/2022 ?Start Time: 1330 ?End Time: 1430 ? ? ?Type of Therapy and Topic: Group Therapy: Avoiding Self-Sabotaging and Enabling Behaviors ? ?Participation Level: Did Not Attend ? ? ?Description of Group:  ?In this group, patients will learn how to identify obstacles, self-sabotaging and enabling behaviors, as well as: what are they, why do we do them and what needs these behaviors meet. Discuss unhealthy relationships and how to have positive healthy boundaries with those that sabotage and enable. Explore aspects of self-sabotage and enabling in yourself and how to limit these self-destructive behaviors in everyday life. ? ? ?Therapeutic Goals: ?1. Patient will identify one obstacle that relates to self-sabotage and enabling behaviors ?2. Patient will identify one personal self-sabotaging or enabling behavior they did prior to admission ?3. Patient will state a plan to change the above identified behavior ?4. Patient will demonstrate ability to communicate their needs through discussion and/or role play.  ? ? ?Summary of Patient Progress: Patient on back hall due to agitation protocol. He is not appropriate for a group setting at this time.  ? ? ? ?Therapeutic Modalities:  ?Cognitive Behavioral Therapy ?Person-Centered Therapy ?Motivational Interviewing ? ? ? ?Ileana Ladd [place], LCSWA ?

## 2022-03-25 NOTE — Plan of Care (Signed)
D: Pt alert and oriented. When asked about experiencing any anxiety/depression pt stated no. When asked about SI/HI/AVH pt stated no. When asked if he hurt anywhere or was in any pain at this time, pt stated no.  ? ?Pt was awake at 0733 due to an incontinence episode. This Clinical research associate with student assisted in wiping down bed and changing linens. Pt was encourage to shower, however refused. Pt is able to answer yes/no assessment questions. Pt has been fairly calm once sitter he was familiar with arrived. Pt enjoyed watching hockey for the better part of the day. Pt called and spoke with his mother as well today. ? ? ?A: Scheduled medications administered to pt, per MD orders. Support and encouragement provided. Frequent verbal contact made. Routine safety checks conducted q15 minutes, hourly rounding by this writer was preformed, as well as continuous 1:1.  ? ?R: No adverse drug reactions noted. Pt verbally contracts for safety at this time. Pt compliant with medications. Pt interacts well with staff on the unit. Pt remains safe at this time. Will continue to monitor.  ? ?Problem: Self-Concept: ?Goal: Level of anxiety will decrease ?Outcome: Progressing ?  ?Problem: Education: ?Goal: Ability to state activities that reduce stress will improve ?Outcome: Not Progressing ?  ?

## 2022-03-25 NOTE — Progress Notes (Signed)
[facility] MD Progress Note ? ?03/25/2022 8:48 AM ?Adam [name]  ?MRN:  [id number] ?Subjective:  ?4/23 ?Patient had small bouts of "fits" yesterday, but no overt physical aggression.  Slept well last night.  Patient is not able to offer much information today as he is a limited historian.  Unable to ascertain mood or presence/absence of psychosis.  Unable to assess pain today. ? ?4/22 ?According to patient's one-to-one sitter, he woke up just fine today, in a calm manner.  He is occasionally irritable upon awakening some days.  He is taking his medications.  I am told that his phone is calming for him.  He gives me a thumbs up while smiling denies any stressors or concerns.  Indicates that he slept well last night.  Tells me that he loves "[place]."  When asked what he needs today, he replies "freedom." ? ?Per nursing report, pain patient tends to wake up late, round 1030 and so gets his morning medications later. ? ?Principal Problem: Difficulty controlling behavior as late effect of traumatic brain injury ([facility]) ?Diagnosis: Principal Problem: ?  Difficulty controlling behavior as late effect of traumatic brain injury ([facility]) ?Active Problems: ?  Schizophrenia, chronic condition with acute exacerbation ([facility]) ?  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 ?Total Time spent with patient: 25 minutes ? ?Past Psychiatric History: Past history of longstanding chronic behavior problems since his stroke ? ?Past Medical History:  ?Past Medical History:  ?Diagnosis Date  ?? Myocardial infarction [facility])   ?? Schizophrenia ([facility])   ?? Stroke [facility])   ?? TBI (traumatic brain injury) ([facility])   ? History reviewed. No pertinent surgical history. ?Family History: History reviewed. No pertinent family history. ?Family Psychiatric  History: See previous ?Social History:  ?Social History  ? ?Substance and Sexual Activity  ?Alcohol Use Not Currently  ?   ?Social History  ? ?Substance and Sexual Activity  ?Drug Use Not  Currently  ?  ?Social History  ? ?Socioeconomic History  ?? Marital status: Single  ?  Spouse name: Not on file  ?? Number of children: Not on file  ?? Years of education: Not on file  ?? Highest education level: Not on file  ?Occupational History  ?? Not on file  ?Tobacco Use  ?? Smoking status: Never  ?  Passive exposure: Never  ?? Smokeless tobacco: Never  ?Vaping Use  ?? Vaping Use: Unknown  ?Substance and Sexual Activity  ?? Alcohol use: Not Currently  ?? Drug use: Not Currently  ?? Sexual activity: Not Currently  ?Other Topics Concern  ?? Not on file  ?Social History Narrative  ?? Not on file  ? ?Social Determinants of Health  ? ?Financial Resource Strain: Not on file  ?Food Insecurity: Not on file  ?Transportation Needs: Not on file  ?Physical Activity: Not on file  ?Stress: Not on file  ?Social Connections: Not on file  ? ?Additional Social History:  ?  ?  ?  ?  ?  ?  ?  ?  ?  ?  ?  ? ?Sleep: Fair ? ?Appetite:  Fair ? ?Current Medications: ?Current Facility-Administered Medications  ?Medication Dose Route Frequency Provider Last Rate Last Admin  ?? acetaminophen (TYLENOL) tablet 650 mg  650 mg Oral Q6H PRN [name], [name], MD      ?? alum & mag hydroxide-simeth (MAALOX/MYLANTA) 200-200-20 MG/5ML suspension 30 mL  30 mL Oral Q4H PRN [name], [name], MD      ?? aspirin EC tablet 81  mg  81 mg Oral Daily [name], [name], MD   81 mg at 03/25/22 0737  ?? atorvastatin (LIPITOR) tablet 20 mg  20 mg Oral Daily [name], [name], MD   20 mg at 03/25/22 0736  ?? benztropine (COGENTIN) tablet 0.5 mg  0.5 mg Oral BID [name], [name], MD   0.5 mg at 03/25/22 0737  ?? clonazePAM (KLONOPIN) tablet 1 mg  1 mg Oral TID [name], [name], MD   1 mg at 03/25/22 0736  ?? cloZAPine (CLOZARIL) tablet 300 mg  300 mg Oral QHS [name], [name], MD   300 mg at 03/24/22 2114  ?? diphenhydrAMINE (BENADRYL) capsule 50 mg  50 mg Oral Q6H PRN [name], [name], MD   50 mg at 03/22/22 1421  ? Or  ?? diphenhydrAMINE (BENADRYL) injection 50 mg  50  mg Intramuscular Q6H PRN [name], [name], MD      ?? gabapentin (NEURONTIN) capsule 300 mg  300 mg Oral TID [name], [name], MD   300 mg at 03/25/22 0737  ?? haloperidol (HALDOL) tablet 5 mg  5 mg Oral TID [name], [name], MD   5 mg at 03/25/22 0736  ?? haloperidol (HALDOL) tablet 5 mg  5 mg Oral Q6H PRN [name], [name], MD   5 mg at 03/22/22 1421  ? Or  ?? haloperidol lactate (HALDOL) injection 5 mg  5 mg Intramuscular Q6H PRN [name], [name], MD      ?? magnesium hydroxide (MILK OF MAGNESIA) suspension 30 mL  30 mL Oral Daily PRN [name], [name], MD      ?? metoprolol succinate (TOPROL-XL) 24 hr tablet 25 mg  25 mg Oral Daily [name], [name], MD   25 mg at 03/25/22 0737  ?? [START ON 03/29/2022] paliperidone (INVEGA SUSTENNA) injection 156 mg  156 mg Intramuscular Q28 days [name], [name], MD      ?? temazepam (RESTORIL) capsule 15 mg  15 mg Oral QHS [name], [name], MD   15 mg at 03/24/22 2114  ?? ziprasidone (GEODON) injection 20 mg  20 mg Intramuscular Q12H PRN [name], [name], MD   20 mg at 03/19/22 1551  ? ? ?Lab Results:  ?No results found for this or any previous visit (from the past 48 hour(s)). ? ? ?Blood Alcohol level:  ?Lab Results  ?Component Value Date  ? ETH <10 07/20/2021  ? ? ?Metabolic Disorder Labs: ?No results found for: HGBA1C, MPG ?No results found for: PROLACTIN ?Lab Results  ?Component Value Date  ? CHOL 167 11/20/2021  ? TRIG 107 11/20/2021  ? HDL 26 (L) 11/20/2021  ? CHOLHDL 6.4 11/20/2021  ? VLDL 21 11/20/2021  ? LDLCALC 120 (H) 11/20/2021  ? ? ?Physical Findings: ?AIMS:  , ,  ,  ,    ?CIWA:    ?COWS:    ? ?Musculoskeletal: ?Strength & Muscle Tone: within normal limits ?Gait & Station: normal ?Patient leans: N/A ? ?Psychiatric Specialty Exam: ? ?Presentation  ?General Appearance: [place] groomed today ?Eye Contact:None ? ?Speech:Clear and Coherent ? ?Speech Volume:Increased ? ?Handedness:Right ? ? ?Mood and Affect  ?Mood:Unable to ascertain ?Affect:flat ? ?Thought Process  ?Thought  Processes:Disorganized ? ?Descriptions of Associations:Loose ? ?Orientation:Partial ? ?Thought Content:Scattered; Illogical ? ?History of Schizophrenia/Schizoaffective disorder:Yes ? ?Duration of Psychotic Symptoms:Greater than six months ? ?Hallucinations:No data recorded ?Ideas of Reference:-- (UTA) ? ?Suicidal Thoughts:No data recorded ?Homicidal Thoughts:No data recorded ? ?Sensorium  ?Memory:Remote Poor; Recent Poor; Immediate Poor; Other (comment) (impaired memory  and cognition) ? ?Judgment:Impaired ? ?Insight:Lacking ? ? ?Executive Functions  ?Concentration:Poor ? ?Attention Span:Poor ? ?Recall:Poor ? ?Fund of Knowledge:Poor ? ?Language:Poor ? ? ?Psychomotor Activity  ?Psychomotor Activity:No data recorded ? ?Assets  ?Assets:Housing; Resilience; Social Support; Talents/Skills ? ? ?Sleep  ?Sleep:No data recorded ? ? ?Review of Systems  ?Unable to perform ROS: Language  ?Blood pressure (!) 131/96, pulse 92, temperature 98.1 ?F (36.7 ?C), temperature source Oral, resp. rate 17, height 5\' 8"  (1.727 m), weight 85.8 kg, SpO2 (!) 87 %. Body mass index is 28.76 kg/m?. ? ? ?Treatment Plan Summary: ?Medication management and Plan no change to medication.  Patient seems to be reasonably stable right now.  I am going to see if we can find a way to get old records on him from [facility] in [place]. ? ?4/22 ?No changes ? ?4/23 ?No changes ? ?5/23, MD ?03/25/2022, 8:48 AM ? ?

## 2022-03-25 NOTE — Progress Notes (Signed)
1:1 Patient Hourly Rounding  ? ?0800: Patient is in room changing, with MHT sitting at this time. ? ?0900: Patient is in room, with MHT sitting at this time.  ? ?1000: Patient is in dayroom, with assigned sitter present. ? ?1100: Patient is in the dayroom, with assigned sitter present.  ? ?1200: Patient is in bedroom, with assigned sitter present. ? ?1300: Patient is in bedroom, with assigned sitter present. ? ?1400: Patient is in dayroom, with assigned sitter present. ? ?1500: Patient is in dayroom, with assigned sitter present. ? ?1600: Patient is in dayroom, with assigned sitter present. ? ?1700: Patient is in dayroom, with assigned sitter present.  ? ?1800: Patient is in bedroom, with assigned sitter present. ? ?1900: Patient is in the dayroom, with assigned sitter present. ?

## 2022-03-26 DIAGNOSIS — S069X0S Unspecified intracranial injury without loss of consciousness, sequela: Secondary | ICD-10-CM | POA: Diagnosis not present

## 2022-03-26 DIAGNOSIS — R4689 Other symptoms and signs involving appearance and behavior: Secondary | ICD-10-CM | POA: Diagnosis not present

## 2022-03-26 NOTE — Progress Notes (Signed)
1:1 Patient hourly rounding  ? ?0800: Patient is in bed, sleeping. Assigned sitter is present. ? ?0900: Patient is awake and ate breakfast. Patient was observed sitting in bed. Patient was cooperative during vitals and compliant with medication administration. Assigned sitter is present with patient. ? ?1000: Patient is in room, assigned sitter is present with patient.  ? ?1100: patient sitting in bed. Assigned sitter is present with patient. ? ?1200: patient is eating lunch. Assigned sitter is present with patient.  ? ?1300: patient compliant with scheduled medication administration. Patient is currently sitting in the dayroom. Assigned sitter is present with patient.  ? ?1400: Patient is in room, assigned sitter is present with patient. ? ?1500: Patient given the opportunity to shave under supervision of a MHT.  ? ?1600: dinner has been brought to patient. Patient is currently eating dinner. Assigned sitter is present with patient.  ? ?1700: patient is currently sitting in the day room. Patient compliant with scheduled medication administration.  ? ?1800: Patient is in the dayroom. Assigned sitter is present with patient. ? ?1900: patient is in the dayroom watching television. Assigned sitter is present with patient. ?

## 2022-03-26 NOTE — Progress Notes (Signed)
Patient awake with a sitter present for the 1:1 Pt is without concern at this time. Pt remains safe on the unit.  ?

## 2022-03-26 NOTE — Plan of Care (Signed)
?  Problem: Education: ?Goal: Ability to state activities that reduce stress will improve ?Outcome: Progressing ?  ?Problem: Self-Concept: ?Goal: Level of anxiety will decrease ?Outcome: Progressing ?  ?

## 2022-03-26 NOTE — Progress Notes (Addendum)
Patient banging on windows repeatedly asking for "razors". Writer explained to patient that we do not provide razors, unless supervised by staff. Patient continues to bang on window. ?

## 2022-03-26 NOTE — Progress Notes (Signed)
Patient has been asleep since around 2200. Pt woke up from an incontinence episode. Pt cleaned, changed and a full linen change completed at this time. Pt remains safe on the unit.  ?

## 2022-03-26 NOTE — Progress Notes (Signed)
[facility] MD Progress Note ? ?03/26/2022 10:55 AM ?Adam [name]  ?MRN:  [id number] ?Subjective: Patient seen and chart reviewed.  No new complaints.  No new behaviors or problems evidenced over the weekend.  When seen today the patient was awake.  He recognized me.  Affect was pleasant.  As has been the case in the past it is my impression that he understands some or possibly all of everything I am telling him but cannot respond with language. ?Principal Problem: Difficulty controlling behavior as late effect of traumatic brain injury (HCC) ?Diagnosis: Principal Problem: ?  Difficulty controlling behavior as late effect of traumatic brain injury (HCC) ?Active Problems: ?  Schizophrenia, chronic condition with acute exacerbation (HCC) ?  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 ?Total Time spent with patient: 30 minutes ? ?Past Psychiatric History: Past history of severe traumatic brain injury as well as schizophrenia ? ?Past Medical History:  ?Past Medical History:  ?Diagnosis Date  ? Myocardial infarction [facility])   ? Schizophrenia (HCC)   ? Stroke [facility])   ? TBI (traumatic brain injury) (HCC)   ? History reviewed. No pertinent surgical history. ?Family History: History reviewed. No pertinent family history. ?Family Psychiatric  History: See previous ?Social History:  ?Social History  ? ?Substance and Sexual Activity  ?Alcohol Use Not Currently  ?   ?Social History  ? ?Substance and Sexual Activity  ?Drug Use Not Currently  ?  ?Social History  ? ?Socioeconomic History  ? Marital status: Single  ?  Spouse name: Not on file  ? Number of children: Not on file  ? Years of education: Not on file  ? Highest education level: Not on file  ?Occupational History  ? Not on file  ?Tobacco Use  ? Smoking status: Never  ?  Passive exposure: Never  ? Smokeless tobacco: Never  ?Vaping Use  ? Vaping Use: Unknown  ?Substance and Sexual Activity  ? Alcohol use: Not Currently  ? Drug use: Not Currently  ? Sexual  activity: Not Currently  ?Other Topics Concern  ? Not on file  ?Social History Narrative  ? Not on file  ? ?Social Determinants of Health  ? ?Financial Resource Strain: Not on file  ?Food Insecurity: Not on file  ?Transportation Needs: Not on file  ?Physical Activity: Not on file  ?Stress: Not on file  ?Social Connections: Not on file  ? ?Additional Social History:  ?  ?  ?  ?  ?  ?  ?  ?  ?  ?  ?  ? ?Sleep: Fair ? ?Appetite:  Fair ? ?Current Medications: ?Current Facility-Administered Medications  ?Medication Dose Route Frequency Provider Last Rate Last Admin  ? acetaminophen (TYLENOL) tablet 650 mg  650 mg Oral Q6H PRN Audryna Wendt T, MD      ? alum & mag hydroxide-simeth (MAALOX/MYLANTA) 200-200-20 MG/5ML suspension 30 mL  30 mL Oral Q4H PRN Lupita Rosales T, MD      ? aspirin EC tablet 81 mg  81 mg Oral Daily Shandell Giovanni, [name], MD   81 mg at 03/26/22 4360  ? atorvastatin (LIPITOR) tablet 20 mg  20 mg Oral Daily Ashla Murph, [name], MD   20 mg at 03/26/22 6770  ? benztropine (COGENTIN) tablet 0.5 mg  0.5 mg Oral BID Crixus Mcaulay, [name], MD   0.5 mg at 03/26/22 3403  ? clonazePAM (KLONOPIN) tablet 1 mg  1 mg Oral TID Ondre Salvetti, [name], MD   1 mg at 03/26/22 5248  ?  cloZAPine (CLOZARIL) tablet 300 mg  300 mg Oral QHS Ita Fritzsche, [name], MD   300 mg at 03/25/22 2048  ? diphenhydrAMINE (BENADRYL) capsule 50 mg  50 mg Oral Q6H PRN Marsh Heckler, [name], MD   50 mg at 03/22/22 1421  ? Or  ? diphenhydrAMINE (BENADRYL) injection 50 mg  50 mg Intramuscular Q6H PRN Seidy Labreck T, MD      ? gabapentin (NEURONTIN) capsule 300 mg  300 mg Oral TID Anaalicia Reimann, [name], MD   300 mg at 03/26/22 1191  ? haloperidol (HALDOL) tablet 5 mg  5 mg Oral TID Fleta Borgeson, [name], MD   5 mg at 03/26/22 4782  ? haloperidol (HALDOL) tablet 5 mg  5 mg Oral Q6H PRN Eliud Polo, [name], MD   5 mg at 03/22/22 1421  ? Or  ? haloperidol lactate (HALDOL) injection 5 mg  5 mg Intramuscular Q6H PRN Avi Kerschner T, MD      ? magnesium hydroxide (MILK OF MAGNESIA) suspension 30 mL   30 mL Oral Daily PRN Marybella Ethier T, MD      ? metoprolol succinate (TOPROL-XL) 24 hr tablet 25 mg  25 mg Oral Daily Merita Hawks, [name], MD   25 mg at 03/26/22 9562  ? [START ON 03/29/2022] paliperidone (INVEGA SUSTENNA) injection 156 mg  156 mg Intramuscular Q28 days Caretha Rumbaugh T, MD      ? temazepam (RESTORIL) capsule 15 mg  15 mg Oral QHS Thong Feeny, [name], MD   15 mg at 03/25/22 2048  ? ziprasidone (GEODON) injection 20 mg  20 mg Intramuscular Q12H PRN Analei Whinery, [name], MD   20 mg at 03/19/22 1551  ? ? ?Lab Results: No results found for this or any previous visit (from the past 48 hour(s)). ? ?Blood Alcohol level:  ?Lab Results  ?Component Value Date  ? ETH <10 07/20/2021  ? ? ?Metabolic Disorder Labs: ?No results found for: HGBA1C, MPG ?No results found for: PROLACTIN ?Lab Results  ?Component Value Date  ? CHOL 167 11/20/2021  ? TRIG 107 11/20/2021  ? HDL 26 (L) 11/20/2021  ? CHOLHDL 6.4 11/20/2021  ? VLDL 21 11/20/2021  ? LDLCALC 120 (H) 11/20/2021  ? ? ?Physical Findings: ?AIMS:  , ,  ,  ,    ?CIWA:    ?COWS:    ? ?Musculoskeletal: ?Strength & Muscle Tone: within normal limits ?Gait & Station: normal ?Patient leans: N/A ? ?Psychiatric Specialty Exam: ? ?Presentation  ?General Appearance: Disheveled ? ?Eye Contact:None ? ?Speech:Clear and Coherent ? ?Speech Volume:Increased ? ?Handedness:Right ? ? ?Mood and Affect  ?Mood:Anxious; Irritable ? ?Affect:Congruent ? ? ?Thought Process  ?Thought Processes:Disorganized ? ?Descriptions of Associations:Loose ? ?Orientation:Partial ? ?Thought Content:Scattered; Illogical ? ?History of Schizophrenia/Schizoaffective disorder:Yes ? ?Duration of Psychotic Symptoms:Greater than six months ? ?Hallucinations:No data recorded ?Ideas of Reference:-- (UTA) ? ?Suicidal Thoughts:No data recorded ?Homicidal Thoughts:No data recorded ? ?Sensorium  ?Memory:Remote Poor; Recent Poor; Immediate Poor; Other (comment) (impaired memory and  cognition) ? ?Judgment:Impaired ? ?Insight:Lacking ? ? ?Executive Functions  ?Concentration:Poor ? ?Attention Span:Poor ? ?Recall:Poor ? ?Fund of Knowledge:Poor ? ?Language:Poor ? ? ?Psychomotor Activity  ?Psychomotor Activity:No data recorded ? ?Assets  ?Assets:Housing; Resilience; Social Support; Talents/Skills ? ? ?Sleep  ?Sleep:No data recorded ? ? ?Physical Exam: ?Physical Exam ?Constitutional:   ?   Appearance: Normal appearance.  ?HENT:  ?   Head: Normocephalic and atraumatic.  ?   Mouth/Throat:  ?   Pharynx: Oropharynx is clear.  ?Eyes:  ?  Pupils: Pupils are equal, round, and reactive to light.  ?Cardiovascular:  ?   Rate and Rhythm: Normal rate and regular rhythm.  ?Pulmonary:  ?   Effort: Pulmonary effort is normal.  ?   Breath sounds: Normal breath sounds.  ?Abdominal:  ?   General: Abdomen is flat.  ?   Palpations: Abdomen is soft.  ?Musculoskeletal:     ?   General: Normal range of motion.  ?Skin: ?   General: Skin is warm and dry.  ?Neurological:  ?   General: No focal deficit present.  ?   Mental Status: He is alert. Mental status is at baseline.  ?Psychiatric:     ?   Attention and Perception: Attention normal.     ?   Mood and Affect: Mood normal. Affect is blunt.     ?   Speech: He is noncommunicative.  ? ?Review of Systems  ?Unable to perform ROS: Language  ?Blood pressure (!) 143/99, pulse (!) 109, temperature 97.7 ?F (36.5 ?C), temperature source Oral, resp. rate 17, height 5\' 8"  (1.727 m), weight 85.8 kg, SpO2 99 %. Body mass index is 28.76 kg/m?. ? ? ?Treatment Plan Summary: ?Daily contact with patient to assess and evaluate symptoms and progress in treatment, Medication management, and Plan patient is on the psychiatric ward now as part of an arrangement to have him managed on different floors of the hospital because of his very long stay.  He seems to have settled in here and is doing well and there is no indication to change psychiatric medicine.  Treatment team discussed the possibility  of reinvestigate in communication with him.  I will request a speech therapy consult once again.  This was done late last year and at that time he was unable to make use of a language board or similar device but staff feel it may be more likely to work now.

## 2022-03-26 NOTE — TOC Progression Note (Signed)
Transition of Care (TOC) - Progression Note  ? ? ?Patient Details  ?Name: Adam [name] ?MRN: [id number] ?Date of Birth: [birth year] ? ?Transition of Care (TOC) CM/SW Contact  ?Torreon Cellar, RN ?Phone Number: ?03/26/2022, 11:44 AM ? ?Clinical Narrative:    ?Spoke to [facility] @ [facility] who reports [facility] has denied patient due to not accepting any further patients. [facility] has denied due to not being a locked unit and unable to prevent patient from leaving facility. [facility] has denied due to lack of psychiatrist on staff currently. RN CM requested option of patient being placed out of the state and [facility] reported they do not typically place adults out of the state due to payment issues however she is willing to outreach to a supervisor due to no other neurocognitive centers left in this state. RN CM discussed improvement in behaviors with structured schedule and patient being moved among different units and requiring 1-1 sitters. [facility] agreed to accept updated clinicals and reach back out to the neurocognitive centers with updated clinicals for second review.  ? ? ?  ?  ? ?Expected Discharge Plan and Services ?  ?  ?  ?  ?  ?                ?  ?  ?  ?  ?  ?  ?  ?  ?  ?  ? ? ?Social Determinants of Health (SDOH) Interventions ?  ? ?Readmission Risk Interventions ?   ? View : No data to display.  ?  ?  ?  ? ? ?

## 2022-03-26 NOTE — Progress Notes (Signed)
Recreation Therapy Notes ? ?Date: 03/26/2022 ? ?Time: 10:45 am   ? ?Location: Courtyard    ? ?Behavioral response: Appropriate ? ?Intervention Topic: Leisure   ? ?Discussion/Intervention:  ?Group content today was focused on leisure. The group defined what leisure is and some positive leisure activities they participate in. Individuals identified the difference between good and bad leisure. Participants expressed how they feel after participating in the leisure of their choice. The group discussed how they go about picking a leisure activity and if others are involved in their leisure activities. The patient stated how many leisure activities they have to choose from and reasons why it is important to have leisure time. Individuals participated in the intervention ?Exploration of Leisure? where they had a chance to identify new leisure activities as well as benefits of leisure. ?Clinical Observations/Feedback: ?Patient came to group for 5 minutes and then went back to his room. Individual never returned to group.  ?Rice Walsh LRT/CTRS  ? ? ? ? ? ? ? ?Adam [name] ?03/26/2022 12:28 PM ?

## 2022-03-27 DIAGNOSIS — R4689 Other symptoms and signs involving appearance and behavior: Secondary | ICD-10-CM | POA: Diagnosis not present

## 2022-03-27 DIAGNOSIS — S069X0S Unspecified intracranial injury without loss of consciousness, sequela: Secondary | ICD-10-CM | POA: Diagnosis not present

## 2022-03-27 NOTE — Evaluation (Signed)
Speech Language Pathology Evaluation ?Patient Details ?Name: Adam [name] ?MRN: [id number] ?DOB: 11-07-1961 ?Today's Date: 03/27/2022 ?Time: 1230-1300 ?SLP Time Calculation (min) (ACUTE ONLY): 30 min ? ?Problem List:  ?Patient Active Problem List  ? Diagnosis Date Noted  ? Difficulty controlling behavior as late effect of traumatic brain injury (HCC) 03/19/2022  ? UTI (urinary tract infection) 03/09/2022  ? Fall 02/24/2022  ? Aspiration pneumonia (HCC) 02/18/2022  ? Constipation 02/09/2022  ? Elevated CK 01/15/2022  ? TBI (traumatic brain injury) (HCC) 11/27/2021  ? Stroke [facility]) 11/27/2021  ? NSTEMI (non-ST elevated myocardial infarction) (HCC) 11/21/2021  ? Acute metabolic encephalopathy 11/21/2021  ? DNR (do not resuscitate)/DNI(Do Not Intubate) 11/20/2021  ? 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 Schizophrenia, chronic condition with acute exacerbation (HCC) 07/20/2021  ? ?Past Medical History:  ?Past Medical History:  ?Diagnosis Date  ? Myocardial infarction [facility])   ? Schizophrenia (HCC)   ? Stroke [facility])   ? TBI (traumatic brain injury) (HCC)   ? ?Past Surgical History: History reviewed. No pertinent surgical history. ?HPI:  ?ST services were consulted to assess pt's ability with possibility of using a picture board or other such device to assist with communication. 61 year old man with chronic impairment from brain injury on top of schizophrenia. Pt initially under observation status in [facility] ED beginning 07/20/2022 with transfer to inpatient status on 11/20/2021 and then pt transferred to behavioral health unit on 03/19/2022. Most recent head CT reveals Chronic areas of encephalomalacia throughout the left cerebral hemisphere unchanged, so there is concern for possible language impairment.  ? ?Assessment / Plan / Recommendation ?Clinical Impression ? Pt was initially asleep but easily awakened and was willing to get up, go into dayroom and participate in assessment  with SLP. Pt demonstrated good attention to tasks and was appropriate with this Clinical research associate. Receptive language abilities were informally assessed through object selection tasks, yes/no questions and following 1-step directions. Given a field of 3 basic objects, pt was able to select appropriate object with 67%, follow basic 1-step directions with 50% accuracy, answer basic yes/no questions with 67% accuracy and was 67% accurate when answering complex yes/no questions.      ? ?Pt's expressive communication is c/b self-initiated responses that are at times random words but most of his expressive communication is a combination of echolalia and palilalia. Unfortunately, this results inability to communicate his wants/needs effectively.      ? ?To help pt better understand his environment and better communicate his wants and needs, a course of skilled ST intervention is feasible to further assess possibility of using some form of alternative communication. ?   ?SLP Assessment ? SLP Recommendation/Assessment: Patient needs continued Speech Lanaguage Pathology Services ?SLP Visit Diagnosis: Cognitive communication deficit (R41.841)  ?  ?Recommendations for follow up therapy are one component of a multi-disciplinary discharge planning process, led by the attending physician.  Recommendations may be updated based on patient status, additional functional criteria and insurance authorization. ?   ?Follow Up Recommendations ? Follow physician's recommendations for discharge plan and follow up therapies  ?  ?Assistance Recommended at Discharge ? Frequent or constant Supervision/Assistance  ?Functional Status Assessment Patient has had a recent decline in their functional status and/or demonstrates limited ability to make significant improvements in function in a reasonable and predictable amount of time  ?Frequency and Duration min 2x/week  ?2 weeks ?  ?   ?SLP Evaluation ?Cognition ? Overall Cognitive Status: No family/caregiver  present to determine baseline cognitive  functioning ?Attention: Sustained ?Sustained Attention: Appears intact ?Problem Solving: Appears intact  ?  ?   ?Comprehension ? Auditory Comprehension ?Yes/No Questions: Impaired ?Basic Immediate Environment Questions: 25-49% accurate ?Complex Questions: 25-49% accurate ?Commands: Impaired ?One Step Basic Commands: 25-49% accurate ?Reading Comprehension ?Reading Status: Not tested  ?  ?Expression Expression ?Primary Mode of Expression: Verbal ?Verbal Expression ?Overall Verbal Expression: Impaired ?Written Expression ?Dominant Hand: Left (pt fed himself using his left hand, he didn't hold anything in his right hand throughout the session) ?Written Expression: Not tested   ?Oral / Motor ? Oral Motor/Sensory Function ?Overall Oral Motor/Sensory Function: Mild impairment ?Facial ROM: Reduced right ?Facial Symmetry: Abnormal symmetry right ?Facial Strength: Reduced right ?Motor Speech ?Overall Motor Speech: Appears within functional limits for tasks assessed   ?        ?Adam [name] B. Dreama Saa, M.S., CCC-SLP, CBIS ?Speech-Language Pathologist ?Rehabilitation Services ?Office [phone] ? ?Adam [name] ?03/27/2022, 4:18 PM ? ?

## 2022-03-27 NOTE — Progress Notes (Signed)
Patient had been sleeping, but is now being seen by Speech Language Pathology. This writer will administer scheduled afternoon medication once he is finished with this consult. MD will be notified. ?

## 2022-03-27 NOTE — Progress Notes (Signed)
This Probation officer held patient's scheduled BP medication being that he did not allow his sitter, nor this Probation officer to obtain a set of vitals on him. Patient did tolerate his other medication administration well, without any issues. MD will be notified. ?

## 2022-03-27 NOTE — TOC Progression Note (Signed)
Transition of Care (TOC) - Progression Note  ? ? ?Patient Details  ?Name: Adam [name] ?MRN: [id number] ?Date of Birth: 1961-01-05 ? ?Transition of Care (TOC) CM/SW Contact  ?Barada Cellar, RN ?Phone Number: ?03/27/2022, 12:55 PM ? ?Clinical Narrative:    ?Confirmed with [facility] @ [facility] that [employer] with [facility] had agreed to review updated clinical for reconsideration for admission. All updated clinical has been sent and received by [facility]  ? ? ?  ?  ? ?Expected Discharge Plan and Services ?  ?  ?  ?  ?  ?                ?  ?  ?  ?  ?  ?  ?  ?  ?  ?  ? ? ?Social Determinants of Health (SDOH) Interventions ?  ? ?Readmission Risk Interventions ?   ? View : No data to display.  ?  ?  ?  ? ? ?

## 2022-03-27 NOTE — Progress Notes (Signed)
Patient provided evening snacks. Pleasant mood. Will continue to monitor with q15 minute safety rounds. ? ? ? ?C Butler-Nicholson, LPN ?

## 2022-03-27 NOTE — Progress Notes (Signed)
Patient was standing at the double doors, trying to get this writer's attention. When this writer went back to see what he wanted, he had the palms of his hands touching at the pinky, saying "male". This Clinical research associate and his assigned Recruitment consultant were trying to figure out what he was trying to say, but when we couldn't, he got frustrated and walked off and went back to his room. ?

## 2022-03-27 NOTE — Progress Notes (Signed)
[facility] MD Progress Note ? ?03/27/2022 12:06 PM ?Adam [name]  ?MRN:  [id number] ?Subjective: Patient seen for follow-up.  61 year old man with chronic impairment from brain injury on top of schizophrenia.  Patient was asleep when I came back to see him but woke up easily.  Smiled.  Seemed to be paying some attention but not very involved or attempting conversation.  Not agitated today.  He is pretty calm as long as he has his cell phone and other things to keep him occupied.  Seems to be tolerating medicine.  No new violent outbursts.  In reviewing the chart I see that he has now been officially declined by all 3 of the long-term neuropsychiatric facilities we had previously referred him to. ?Principal Problem: Difficulty controlling behavior as late effect of traumatic brain injury (HCC) ?Diagnosis: Principal Problem: ?  Difficulty controlling behavior as late effect of traumatic brain injury (HCC) ?Active Problems: ?  Schizophrenia, chronic condition with acute exacerbation (HCC) ?  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 ?Total Time spent with patient: 30 minutes ? ?Past Psychiatric History: Reportedly history of schizophrenia then compounded by a stroke with aphasia and neurologic injury ? ?Past Medical History:  ?Past Medical History:  ?Diagnosis Date  ? Myocardial infarction [facility])   ? Schizophrenia (HCC)   ? Stroke [facility])   ? TBI (traumatic brain injury) (HCC)   ? History reviewed. No pertinent surgical history. ?Family History: History reviewed. No pertinent family history. ?Family Psychiatric  History: See previous ?Social History:  ?Social History  ? ?Substance and Sexual Activity  ?Alcohol Use Not Currently  ?   ?Social History  ? ?Substance and Sexual Activity  ?Drug Use Not Currently  ?  ?Social History  ? ?Socioeconomic History  ? Marital status: Single  ?  Spouse name: Not on file  ? Number of children: Not on file  ? Years of education: Not on file  ? Highest education level:  Not on file  ?Occupational History  ? Not on file  ?Tobacco Use  ? Smoking status: Never  ?  Passive exposure: Never  ? Smokeless tobacco: Never  ?Vaping Use  ? Vaping Use: Unknown  ?Substance and Sexual Activity  ? Alcohol use: Not Currently  ? Drug use: Not Currently  ? Sexual activity: Not Currently  ?Other Topics Concern  ? Not on file  ?Social History Narrative  ? Not on file  ? ?Social Determinants of Health  ? ?Financial Resource Strain: Not on file  ?Food Insecurity: Not on file  ?Transportation Needs: Not on file  ?Physical Activity: Not on file  ?Stress: Not on file  ?Social Connections: Not on file  ? ?Additional Social History:  ?  ?  ?  ?  ?  ?  ?  ?  ?  ?  ?  ? ?Sleep: Fair ? ?Appetite:  Fair ? ?Current Medications: ?Current Facility-Administered Medications  ?Medication Dose Route Frequency Provider Last Rate Last Admin  ? acetaminophen (TYLENOL) tablet 650 mg  650 mg Oral Q6H PRN Natan Hartog T, MD      ? alum & mag hydroxide-simeth (MAALOX/MYLANTA) 200-200-20 MG/5ML suspension 30 mL  30 mL Oral Q4H PRN Jonel Weldon, [name], MD   30 mL at 03/26/22 2035  ? aspirin EC tablet 81 mg  81 mg Oral Daily Jullie Arps, [name], MD   81 mg at 03/27/22 0846  ? atorvastatin (LIPITOR) tablet 20 mg  20 mg Oral Daily Dynasty Holquin, [name], MD  20 mg at 03/27/22 0846  ? benztropine (COGENTIN) tablet 0.5 mg  0.5 mg Oral BID Mylinda Brook, [name], MD   0.5 mg at 03/27/22 0847  ? clonazePAM (KLONOPIN) tablet 1 mg  1 mg Oral TID Torren Maffeo, [name], MD   1 mg at 03/27/22 0847  ? cloZAPine (CLOZARIL) tablet 300 mg  300 mg Oral QHS Dafne Nield, [name], MD   300 mg at 03/26/22 2128  ? diphenhydrAMINE (BENADRYL) capsule 50 mg  50 mg Oral Q6H PRN Vamsi Apfel, [name], MD   50 mg at 03/26/22 2035  ? Or  ? diphenhydrAMINE (BENADRYL) injection 50 mg  50 mg Intramuscular Q6H PRN Priscilla Finklea T, MD      ? gabapentin (NEURONTIN) capsule 300 mg  300 mg Oral TID Lakesha Levinson, [name], MD   300 mg at 03/27/22 9629  ? haloperidol (HALDOL) tablet 5 mg  5 mg Oral TID  Kita Neace, [name], MD   5 mg at 03/27/22 0847  ? haloperidol (HALDOL) tablet 5 mg  5 mg Oral Q6H PRN Cheskel Silverio, [name], MD   5 mg at 03/22/22 1421  ? Or  ? haloperidol lactate (HALDOL) injection 5 mg  5 mg Intramuscular Q6H PRN Ludy Messamore T, MD      ? magnesium hydroxide (MILK OF MAGNESIA) suspension 30 mL  30 mL Oral Daily PRN Aven Christen T, MD      ? metoprolol succinate (TOPROL-XL) 24 hr tablet 25 mg  25 mg Oral Daily Arren Laminack, [name], MD   25 mg at 03/27/22 0945  ? [START ON 03/29/2022] paliperidone (INVEGA SUSTENNA) injection 156 mg  156 mg Intramuscular Q28 days Klye Besecker T, MD      ? temazepam (RESTORIL) capsule 15 mg  15 mg Oral QHS Sohum Delillo T, MD   15 mg at 03/26/22 2128  ? ziprasidone (GEODON) injection 20 mg  20 mg Intramuscular Q12H PRN Jasmeet Gehl, [name], MD   20 mg at 03/19/22 1551  ? ? ?Lab Results: No results found for this or any previous visit (from the past 48 hour(s)). ? ?Blood Alcohol level:  ?Lab Results  ?Component Value Date  ? ETH <10 07/20/2021  ? ? ?Metabolic Disorder Labs: ?No results found for: HGBA1C, MPG ?No results found for: PROLACTIN ?Lab Results  ?Component Value Date  ? CHOL 167 11/20/2021  ? TRIG 107 11/20/2021  ? HDL 26 (L) 11/20/2021  ? CHOLHDL 6.4 11/20/2021  ? VLDL 21 11/20/2021  ? LDLCALC 120 (H) 11/20/2021  ? ? ?Physical Findings: ?AIMS:  , ,  ,  ,    ?CIWA:    ?COWS:    ? ?Musculoskeletal: ?Strength & Muscle Tone: within normal limits ?Gait & Station: normal ?Patient leans: N/A ? ?Psychiatric Specialty Exam: ? ?Presentation  ?General Appearance: Disheveled ? ?Eye Contact:None ? ?Speech:Clear and Coherent ? ?Speech Volume:Increased ? ?Handedness:Right ? ? ?Mood and Affect  ?Mood:Anxious; Irritable ? ?Affect:Congruent ? ? ?Thought Process  ?Thought Processes:Disorganized ? ?Descriptions of Associations:Loose ? ?Orientation:Partial ? ?Thought Content:Scattered; Illogical ? ?History of Schizophrenia/Schizoaffective disorder:Yes ? ?Duration of Psychotic Symptoms:Greater  than six months ? ?Hallucinations:No data recorded ?Ideas of Reference:-- (UTA) ? ?Suicidal Thoughts:No data recorded ?Homicidal Thoughts:No data recorded ? ?Sensorium  ?Memory:Remote Poor; Recent Poor; Immediate Poor; Other (comment) (impaired memory and cognition) ? ?Judgment:Impaired ? ?Insight:Lacking ? ? ?Executive Functions  ?Concentration:Poor ? ?Attention Span:Poor ? ?Recall:Poor ? ?Fund of Knowledge:Poor ? ?Language:Poor ? ? ?Psychomotor Activity  ?Psychomotor Activity:No data recorded ? ?Assets  ?Assets:Housing; Resilience;  Social Support; Talents/Skills ? ? ?Sleep  ?Sleep:No data recorded ? ? ?Physical Exam: ?Physical Exam ?Vitals and nursing note reviewed.  ?Constitutional:   ?   Appearance: Normal appearance.  ?HENT:  ?   Head: Normocephalic and atraumatic.  ?   Mouth/Throat:  ?   Pharynx: Oropharynx is clear.  ?Eyes:  ?   Pupils: Pupils are equal, round, and reactive to light.  ?Cardiovascular:  ?   Rate and Rhythm: Normal rate and regular rhythm.  ?Pulmonary:  ?   Effort: Pulmonary effort is normal.  ?   Breath sounds: Normal breath sounds.  ?Abdominal:  ?   General: Abdomen is flat.  ?   Palpations: Abdomen is soft.  ?Musculoskeletal:     ?   General: Normal range of motion.  ?Skin: ?   General: Skin is warm and dry.  ?Neurological:  ?   General: No focal deficit present.  ?   Mental Status: He is alert. Mental status is at baseline.  ?Psychiatric:     ?   Attention and Perception: He is inattentive.     ?   Mood and Affect: Affect is blunt.     ?   Speech: He is noncommunicative.  ? ?Review of Systems  ?Unable to perform ROS: Language  ?Blood pressure (!) 143/99, pulse (!) 109, temperature 97.7 ?F (36.5 ?C), temperature source Oral, resp. rate 17, height 5\' 8"  (1.727 m), weight 85.8 kg, SpO2 99 %. Body mass index is 28.76 kg/m?. ? ? ?Treatment Plan Summary: ?Medication management and Plan honestly I think he has stabilized a fair bit over time and especially since being on clozapine.  I cut back the  clozapine dose a while ago because of an elevated blood level and that has helped him to be more awake during the day but he is still less agitated.  We continue to keep him on the back hallway but will allo

## 2022-03-27 NOTE — Progress Notes (Signed)
Assisted patient with shaving and prompted patient to bath afterward. Change of clothes provided.  Patient was pleasant and cooperative and happy to be able to shave. Patient allowed for his clothes to be washed. Will continue to monitor with q 15 minute safety checks. Patient remains with 1:1 sitter for safety and monitoring. ? ? ? ? ?C Butler-Nicholson, LPN  ?

## 2022-03-27 NOTE — Progress Notes (Signed)
This Clinical research associate informed MD that patient's BP medication was held earlier this morning, due to not being able to obtain a set of vitals. MD stated to this writer that he is fine with patient receiving BP medication without a current BP. This Clinical research associate will administer patient's BP medication. ?

## 2022-03-27 NOTE — Progress Notes (Signed)
Patient received QHS meds and tolerated without incident. Will continue to monitor with q15 minute safety checks.  ? ? ?C Butler-Nicholson, LPN ?

## 2022-03-27 NOTE — Progress Notes (Signed)
Patient had an incontinent episode this morning. Patient's sitter was given clean bedding and scrubs. ?

## 2022-03-27 NOTE — Plan of Care (Signed)
D- Patient alert and oriented to person and situation. Patient presented in a pleasant mood on assessment reporting that he slept good last night and had no complaints to voice to this Clinical research associate. This Clinical research associate asked patient if he was experiencing any signs/symptoms of depression/anxiety, and although patient is childlike and only speaks a few words, he said "no". This writer is unable to assess if patient is experiencing any SI/HI/AVH. Patient does not appear to be in any acute distress nor pain at this time. ? ?A- Some scheduled medications administered to patient, per MD orders. Support and encouragement provided. Routine safety checks conducted every 15 minutes.  Patient informed to notify staff with problems or concerns. ? ?R- No adverse drug reactions noted. Patient compliant with medications. Patient receptive, calm, and cooperative. Patient remains safe at this time. ? ?Problem: Safety: ?Goal: Violent Restraint(s) ?Outcome: Progressing ?  ?Problem: Education: ?Goal: Ability to state activities that reduce stress will improve ?Outcome: Progressing ?  ?Problem: Coping: ?Goal: Ability to identify and develop effective coping behavior will improve ?Outcome: Progressing ?  ?Problem: Self-Concept: ?Goal: Ability to identify factors that promote anxiety will improve ?Outcome: Progressing ?Goal: Level of anxiety will decrease ?Outcome: Progressing ?Goal: Ability to modify response to factors that promote anxiety will improve ?Outcome: Progressing ?  ?

## 2022-03-27 NOTE — Plan of Care (Signed)
?  Problem: Safety: ?Goal: Violent Restraint(s) ?Outcome: Progressing ?  ?Problem: Self-Concept: ?Goal: Level of anxiety will decrease ?Outcome: Progressing ?  ?

## 2022-03-27 NOTE — Progress Notes (Signed)
Patient was knocking on the double doors and when this writer went to see what he wanted, he was saying "shaving cream". This writer went to retrieve some shaving cream for patient and it was given to his Recruitment consultant. ?

## 2022-03-27 NOTE — Progress Notes (Signed)
1:1 Patient Hourly Rounding ? ?0800: Patient is in the bathroom, with his assigned safety sitter present at his side. ? ?0900: Patient is in his room, awake, with his assigned safety sitter present. ? ?1000: Patient is in bed, sleeping, with his assigned safety sitter present. ? ?1100: Patient is in bed, asleep, with his assigned safety sitter present. ? ?1200: Patient is in bed, asleep, with his assigned safety sitter present. ? ?1300: Patient is in the dayroom, in a consult with SLP, with his assigned safety sitter present. ? ?1400: Patient is in bed, awake, with his assigned safety sitter present. ? ?1500: Patient is in the dayroom, with his assigned safety sitter present. ? ?1600: Patient is in bed, asleep, with his assigned safety sitter present. ? ?1700: Patient is in the dayroom, eating dinner, with his assigned safety sitter present. ? ?1800: Patient is in his room, with his assigned safety sitter present. ? ?1900: Patient is in the dayroom, with his assigned safety sitter present. ?

## 2022-03-27 NOTE — Progress Notes (Addendum)
Patient was given his afternoon medication, along with his scheduled morning BP medication. Patient tolerated med administration well, without any issues. Patient remains safe on the unit. ?

## 2022-03-27 NOTE — Progress Notes (Signed)
Patient complaint of stomach pain. Provided Mylanta to help ease symptoms.  Sitter also reported that he had a runny nose this evening.  Benadryl was provided to help with those symptoms.  Will continue to monitor with q15 minute safety checks. ? ? ? ? ? ?C Butler-Nicholson, LPN ? ? ?

## 2022-03-28 DIAGNOSIS — R4689 Other symptoms and signs involving appearance and behavior: Secondary | ICD-10-CM | POA: Diagnosis not present

## 2022-03-28 DIAGNOSIS — S069X0S Unspecified intracranial injury without loss of consciousness, sequela: Secondary | ICD-10-CM | POA: Diagnosis not present

## 2022-03-28 LAB — CBC WITH DIFFERENTIAL/PLATELET
Abs Immature Granulocytes: 0.04 10*3/uL (ref 0.00–0.07)
Basophils Absolute: 0.1 10*3/uL (ref 0.0–0.1)
Basophils Relative: 1 %
Eosinophils Absolute: 0 10*3/uL (ref 0.0–0.5)
Eosinophils Relative: 0 %
HCT: 39.8 % (ref 39.0–52.0)
Hemoglobin: 13.2 g/dL (ref 13.0–17.0)
Immature Granulocytes: 0 %
Lymphocytes Relative: 14 %
Lymphs Abs: 1.3 10*3/uL (ref 0.7–4.0)
MCH: 29.9 pg (ref 26.0–34.0)
MCHC: 33.2 g/dL (ref 30.0–36.0)
MCV: 90 fL (ref 80.0–100.0)
Monocytes Absolute: 0.8 10*3/uL (ref 0.1–1.0)
Monocytes Relative: 8 %
Neutro Abs: 7 10*3/uL (ref 1.7–7.7)
Neutrophils Relative %: 77 %
Platelets: 208 10*3/uL (ref 150–400)
RBC: 4.42 MIL/uL (ref 4.22–5.81)
RDW: 13.2 % (ref 11.5–15.5)
WBC: 9.3 10*3/uL (ref 4.0–10.5)
nRBC: 0 % (ref 0.0–0.2)

## 2022-03-28 NOTE — Progress Notes (Signed)
02:00 patient pointing a picture telling " Cleopatra." Gave more copies of the same picture but patient did not take it. No aggressive behaviors noted.Back to bed resting. Sitter with patient. ? ?03:00 Speech therapist with patient. She recommended using eye pad with a communication app. Patient cooperated with therapist. ? ?04:00  Resting in bed. Sitter with patient/ ? ?05:00  Had dinner.Compliant with medications. Cooperative staff. ? ?06:00 Patient sleeping at this time.Sitter with patient. ?

## 2022-03-28 NOTE — Progress Notes (Signed)
Patient awake in dayroom.  Wanting to listen to music.  Sitter in process of loading computer, some anxiousness on Adam [name]'s part as he waits.  Will continue to monitor. ? ? ? ?C Butler-Nicholson, LPN ? ? ?

## 2022-03-28 NOTE — Progress Notes (Signed)
[facility] MD Progress Note ? ?03/28/2022 2:18 PM ?Adam [name]  ?MRN:  [id number] ?Subjective: Follow-up for this 61 year old man in the hospital for a long stay due to behavior.  No new evidence of complaints today.  He is mostly staying in his room but he is eating.  No recent episodes of agitation and aggression or violence.  Staff reported the concern that he was not bathing.  I asked the patient about this and he he seemed to be dismissive of the claim but of course it is impossible to be sure.  I tried to communicate with him that speech therapy had had some ideas about how to use his phone for communication.  I have no idea if he understood but he immediately became dismissive repeating the word "no" over and over. ?Principal Problem: Difficulty controlling behavior as late effect of traumatic brain injury (HCC) ?Diagnosis: Principal Problem: ?  Difficulty controlling behavior as late effect of traumatic brain injury (HCC) ?Active Problems: ?  Schizophrenia, chronic condition with acute exacerbation (HCC) ?  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 ?Total Time spent with patient: 30 minutes ? ?Past Psychiatric History: Longstanding history of chronic mental illness compounded by a global severe stroke about 7 years ago ? ?Past Medical History:  ?Past Medical History:  ?Diagnosis Date  ? Myocardial infarction [facility])   ? Schizophrenia (HCC)   ? Stroke [facility])   ? TBI (traumatic brain injury) (HCC)   ? History reviewed. No pertinent surgical history. ?Family History: History reviewed. No pertinent family history. ?Family Psychiatric  History: See previous ?Social History:  ?Social History  ? ?Substance and Sexual Activity  ?Alcohol Use Not Currently  ?   ?Social History  ? ?Substance and Sexual Activity  ?Drug Use Not Currently  ?  ?Social History  ? ?Socioeconomic History  ? Marital status: Single  ?  Spouse name: Not on file  ? Number of children: Not on file  ? Years of education: Not on  file  ? Highest education level: Not on file  ?Occupational History  ? Not on file  ?Tobacco Use  ? Smoking status: Never  ?  Passive exposure: Never  ? Smokeless tobacco: Never  ?Vaping Use  ? Vaping Use: Unknown  ?Substance and Sexual Activity  ? Alcohol use: Not Currently  ? Drug use: Not Currently  ? Sexual activity: Not Currently  ?Other Topics Concern  ? Not on file  ?Social History Narrative  ? Not on file  ? ?Social Determinants of Health  ? ?Financial Resource Strain: Not on file  ?Food Insecurity: Not on file  ?Transportation Needs: Not on file  ?Physical Activity: Not on file  ?Stress: Not on file  ?Social Connections: Not on file  ? ?Additional Social History:  ?  ?  ?  ?  ?  ?  ?  ?  ?  ?  ?  ? ?Sleep: Fair ? ?Appetite:  Fair ? ?Current Medications: ?Current Facility-Administered Medications  ?Medication Dose Route Frequency Provider Last Rate Last Admin  ? acetaminophen (TYLENOL) tablet 650 mg  650 mg Oral Q6H PRN Tully Mcinturff T, MD      ? alum & mag hydroxide-simeth (MAALOX/MYLANTA) 200-200-20 MG/5ML suspension 30 mL  30 mL Oral Q4H PRN Tacora Athanas, [name], MD   30 mL at 03/26/22 2035  ? aspirin EC tablet 81 mg  81 mg Oral Daily Lyndy Russman, [name], MD   81 mg at 03/28/22 3295  ? atorvastatin (LIPITOR) tablet  20 mg  20 mg Oral Daily Takeisha Cianci, [name], MD   20 mg at 03/28/22 0347  ? benztropine (COGENTIN) tablet 0.5 mg  0.5 mg Oral BID Cashay Manganelli, [name], MD   0.5 mg at 03/28/22 0815  ? clonazePAM (KLONOPIN) tablet 1 mg  1 mg Oral TID Fran Mcree, [name], MD   1 mg at 03/28/22 1213  ? cloZAPine (CLOZARIL) tablet 300 mg  300 mg Oral QHS Janessa Mickle, [name], MD   300 mg at 03/27/22 2106  ? diphenhydrAMINE (BENADRYL) capsule 50 mg  50 mg Oral Q6H PRN Zafirah Vanzee, [name], MD   50 mg at 03/26/22 2035  ? Or  ? diphenhydrAMINE (BENADRYL) injection 50 mg  50 mg Intramuscular Q6H PRN Ardel Jagger T, MD      ? gabapentin (NEURONTIN) capsule 300 mg  300 mg Oral TID Kyri Shader T, MD   300 mg at 03/28/22 1213  ? haloperidol (HALDOL)  tablet 5 mg  5 mg Oral TID Jehan Bonano, [name], MD   5 mg at 03/28/22 1212  ? haloperidol (HALDOL) tablet 5 mg  5 mg Oral Q6H PRN Daxx Tiggs, [name], MD   5 mg at 03/22/22 1421  ? Or  ? haloperidol lactate (HALDOL) injection 5 mg  5 mg Intramuscular Q6H PRN Azarel Banner T, MD      ? magnesium hydroxide (MILK OF MAGNESIA) suspension 30 mL  30 mL Oral Daily PRN Pio Eatherly T, MD      ? metoprolol succinate (TOPROL-XL) 24 hr tablet 25 mg  25 mg Oral Daily Siomara Burkel, [name], MD   25 mg at 03/28/22 4259  ? [START ON 03/29/2022] paliperidone (INVEGA SUSTENNA) injection 156 mg  156 mg Intramuscular Q28 days Afrika Brick T, MD      ? temazepam (RESTORIL) capsule 15 mg  15 mg Oral QHS Karolina Zamor T, MD   15 mg at 03/27/22 2106  ? ziprasidone (GEODON) injection 20 mg  20 mg Intramuscular Q12H PRN Arihana Ambrocio, [name], MD   20 mg at 03/19/22 1551  ? ? ?Lab Results:  ?Results for orders placed or performed during the hospital encounter of 03/19/22 (from the past 48 hour(s))  ?CBC with Differential/Platelet     Status: None  ? Collection Time: 03/28/22  7:14 AM  ?Result Value Ref Range  ? WBC 9.3 4.0 - 10.5 K/uL  ? RBC 4.42 4.22 - 5.81 MIL/uL  ? Hemoglobin 13.2 13.0 - 17.0 g/dL  ? HCT 39.8 39.0 - 52.0 %  ? MCV 90.0 80.0 - 100.0 fL  ? MCH 29.9 26.0 - 34.0 pg  ? MCHC 33.2 30.0 - 36.0 g/dL  ? RDW 13.2 11.5 - 15.5 %  ? Platelets 208 150 - 400 K/uL  ? nRBC 0.0 0.0 - 0.2 %  ? Neutrophils Relative % 77 %  ? Neutro Abs 7.0 1.7 - 7.7 K/uL  ? Lymphocytes Relative 14 %  ? Lymphs Abs 1.3 0.7 - 4.0 K/uL  ? Monocytes Relative 8 %  ? Monocytes Absolute 0.8 0.1 - 1.0 K/uL  ? Eosinophils Relative 0 %  ? Eosinophils Absolute 0.0 0.0 - 0.5 K/uL  ? Basophils Relative 1 %  ? Basophils Absolute 0.1 0.0 - 0.1 K/uL  ? Immature Granulocytes 0 %  ? Abs Immature Granulocytes 0.04 0.00 - 0.07 K/uL  ?  Comment: Performed at [facility], [address], [place], [place] [address]  ? ? ?Blood Alcohol level:  ?Lab Results  ?Component Value Date  ?  ETH <10  07/20/2021  ? ? ?Metabolic Disorder Labs: ?No results found for: HGBA1C, MPG ?No results found for: PROLACTIN ?Lab Results  ?Component Value Date  ? CHOL 167 11/20/2021  ? TRIG 107 11/20/2021  ? HDL 26 (L) 11/20/2021  ? CHOLHDL 6.4 11/20/2021  ? VLDL 21 11/20/2021  ? LDLCALC 120 (H) 11/20/2021  ? ? ?Physical Findings: ?AIMS:  , ,  ,  ,    ?CIWA:    ?COWS:    ? ?Musculoskeletal: ?Strength & Muscle Tone: within normal limits ?Gait & Station: normal ?Patient leans: N/A ? ?Psychiatric Specialty Exam: ? ?Presentation  ?General Appearance: Disheveled ? ?Eye Contact:None ? ?Speech:Clear and Coherent ? ?Speech Volume:Increased ? ?Handedness:Right ? ? ?Mood and Affect  ?Mood:Anxious; Irritable ? ?Affect:Congruent ? ? ?Thought Process  ?Thought Processes:Disorganized ? ?Descriptions of Associations:Loose ? ?Orientation:Partial ? ?Thought Content:Scattered; Illogical ? ?History of Schizophrenia/Schizoaffective disorder:Yes ? ?Duration of Psychotic Symptoms:Greater than six months ? ?Hallucinations:No data recorded ?Ideas of Reference:-- (UTA) ? ?Suicidal Thoughts:No data recorded ?Homicidal Thoughts:No data recorded ? ?Sensorium  ?Memory:Remote Poor; Recent Poor; Immediate Poor; Other (comment) (impaired memory and cognition) ? ?Judgment:Impaired ? ?Insight:Lacking ? ? ?Executive Functions  ?Concentration:Poor ? ?Attention Span:Poor ? ?Recall:Poor ? ?Fund of Knowledge:Poor ? ?Language:Poor ? ? ?Psychomotor Activity  ?Psychomotor Activity:No data recorded ? ?Assets  ?Assets:Housing; Resilience; Social Support; Talents/Skills ? ? ?Sleep  ?Sleep:No data recorded ? ? ?Physical Exam: ?Physical Exam ?Vitals and nursing note reviewed.  ?Constitutional:   ?   Appearance: Normal appearance.  ?HENT:  ?   Head: Normocephalic and atraumatic.  ?   Mouth/Throat:  ?   Pharynx: Oropharynx is clear.  ?Eyes:  ?   Pupils: Pupils are equal, round, and reactive to light.  ?Cardiovascular:  ?   Rate and Rhythm: Normal rate and regular rhythm.   ?Pulmonary:  ?   Effort: Pulmonary effort is normal.  ?   Breath sounds: Normal breath sounds.  ?Abdominal:  ?   General: Abdomen is flat.  ?   Palpations: Abdomen is soft.  ?Musculoskeletal:     ?   General:

## 2022-03-28 NOTE — Progress Notes (Signed)
Recreation Therapy Notes ? ?Date: 03/28/2022 ?  ?Time: 11:00 am  ?  ?Location: Craft room   ?  ?Behavioral response: Appropriate ?  ?Intervention Topic: Time Management   ?  ?Discussion/Intervention:  ?Patient unable to attend group. ? ?Clinical Observations/Feedback: ?Patient unable to attend group.  ?Freada Twersky LRT/CTRS ? ? ? ? ? ? ? ?Adam [name] ?03/28/2022 11:16 AM ?

## 2022-03-28 NOTE — Plan of Care (Signed)
?  Problem: Safety: ?Goal: Violent Restraint(s) ?Outcome: Progressing ?  ?Problem: Self-Concept: ?Goal: Level of anxiety will decrease ?Outcome: Progressing ?Goal: Ability to modify response to factors that promote anxiety will improve ?Outcome: Progressing ?  ?

## 2022-03-28 NOTE — Progress Notes (Signed)
Pharmacy Consult - Clozapine   ?  ?61 yo male ordered clozapine 300 mg PO qHS  ?  ?Clozapine REMS enrollment Verified: yes ?Clozapine REMS enrollment: 01/04/22  ?REMS patient ID: [id number] ?  ?Last dose: 4/11 @ 2121 - HELD 4/12 due to drop in [place] ?  ?Dose Adjustments This Admission:   ?Decrease dose from 375mg  to 300mg  on  4/10 ?  ? ?Dose Adjustments This Admission: ?2/3 started on clozapine 25mg   ?2/10 clozapine 100 mg  ?2/20 clozapine 175 mg ?2/22 clozapine 200 mg  ?3/06 clozapine 250 mg ?3/08 clozapine 300 mg ?3/17 clozapine 350 mg ?4/10 clozapine 300 mg ?  ?Labs: ?Date: 01/04/22 ANC: 2800   Submitted: 01/04/22 ?Date: 01/14/22 ANC: 2700   Submitted: 01/14/22 ?Date: 01/22/22 ANC: 4000   Submitted: 01/22/22 ?Date: 01/29/22 ANC: 2500   Submitted: 01/29/22 ?Date: 02/05/22 ANC: 2300   Submitted: 02/05/22 ?Date: 02/13/22 ANC: 2800   Submitted: 02/07/22 ?Date: 02/21/22 ANC: 2300   Submitted: 02/21/22 ?Date: 02/28/22 ANC: 4800 ?Date: 03/07/22 [place]: 7900 ?Date: 03/14/22 ANC: 1700   Submitted as 1200 in error ?Date: 03/15/22 ANC: 1800   Submitted: 03/15/22 ?Date: 03/21/22 ANC: 3900  ?Date: 03/28/22 ANC: 7000  ?  ?Plan: ?Resume clozapine 300mg  qHS as previously ordered by Dr Weber Cooks ?Continue weekly ANC  ? ?**CLOZAPINE MONITORING** ?(reflects NEW REMS GUIDELINES EFFECTIVE 09/13/2014): ?Check ANC at least weekly while inpatient. ? ?For general population patients, i.e., those without benign ethnic neutropenia (BEN): ?--If [place] 1000-1499, increase ANC monitoring to 3x/wk ?--If ANC < 1000, HOLD CLOZAPINE and get psych consult ? ?Dorothe Pea, PharmD, BCPS ?Clinical Pharmacist   ? ? ?

## 2022-03-28 NOTE — Progress Notes (Signed)
Patient received QHS meds without incident.  Will continue to monitor and offer support and encouragement. ? ? ?C Butler-Nicholson, LPN ?

## 2022-03-28 NOTE — Progress Notes (Signed)
Speech Language Pathology Treatment: Cognitive-Linquistic  ?Patient Details ?Name: Adam [name] ?MRN: [id number] ?DOB: 1960-12-11 ?Today's Date: 03/28/2022 ?Time: 1400-1430 ?SLP Time Calculation (min) (ACUTE ONLY): 30 min ? ?Assessment / Plan / Recommendation ?Clinical Impression ? Pt seen for ongoing development of possible means of communication d/t expressive communication deficits.  ? ?Pt's nonverbal response to this Clinical research associate indicated that he remembered Clinical research associate. He eagerly greeted me and motioned for me to sit down in the chair next to him.  ? ?Pt was eager to look at SLP iPad with SLP pulling up "Daily Activities" from [employer]. Pt began saying "oh my God, wow, oh." He selected appropriately requested icons with 100% accuracy across 5 trials. Pt was agreeable to SLP putting the app on his phone. SLP attempted but required more than a reasonable time to navigate pt's phone. He grew pleasantly disengaged and took phone to his room before SLP was able to find the app.  ? ?After working with pt, I spoke with pt's nurse, CSW as well as Adam [name] about the possibility of having an iPad dedicated for pt's use. [facility] did have such iPad with SLP to follow up with IT to help with its configuration.  ? ?Pt demonstrated stimulability for possible use of AAC for communication. ST to continue following.  ? ?  ?HPI HPI: ST services were consulted to assess pt's ability with possibility of using a picture board or other such device to assist with communication. 61 year old man with chronic impairment from brain injury on top of schizophrenia. Pt initially under observation status in [facility] ED beginning 07/20/2022 with transfer to inpatient status on 11/20/2021 and then pt transferred to behavioral health unit on 03/19/2022. Most recent head CT reveals Chronic areas of encephalomalacia throughout the left cerebral hemisphere unchanged, so there is concern for possible language impairment. ?  ?   ?SLP Plan ? Continue with current  plan of care ? ?  ?  ?Recommendations for follow up therapy are one component of a multi-disciplinary discharge planning process, led by the attending physician.  Recommendations may be updated based on patient status, additional functional criteria and insurance authorization. ?  ? ?Recommendations  ?   ?   ?    ?   ? ? ? ? Follow Up Recommendations: Follow physician's recommendations for discharge plan and follow up therapies ?Assistance recommended at discharge: Frequent or constant Supervision/Assistance ?SLP Visit Diagnosis: Aphasia (R47.01) ?Plan: Continue with current plan of care ? ? ? ? ?  ?  ?Adam [name] B. Adam [name], M.S., CCC-SLP, CBIS ?Speech-Language Pathologist ?Rehabilitation Services ?Office [phone] ? ? ?Adam [name] ? ?03/28/2022, 6:29 PM ?

## 2022-03-28 NOTE — Progress Notes (Signed)
08:00  Patient up eating breakfast. No irritable behaviors noted at this time. 1:1 sitter with patient. ? ?09:00  Patient took medicine. Little irritable with medications. States " no no pills ". Patient back in bed at this time. ? ?10:00  Patient in day room. Watching TV. Sitter with patient. ? ?11:00  Patient cooperated with vital signs. Appears pleasant and no irritability noted. Sitter with patient. ? ?12:00  Eating lunch. Took medicines with no issues. Patient took shower. Sitter with patient. ? ?13:00  Patient in day room.Watching TV. Sitter with patient. ?

## 2022-03-28 NOTE — Progress Notes (Signed)
Patient is in his room. He is in bed asleep, snoring lightly. Continues to have 1:1 sitter. Will continue to monitor him with q15 minute safety rounds, ? ? ? ? ? ?C Butler-Nicholson, LPN ?

## 2022-03-29 DIAGNOSIS — R4689 Other symptoms and signs involving appearance and behavior: Secondary | ICD-10-CM | POA: Diagnosis not present

## 2022-03-29 DIAGNOSIS — S069X0S Unspecified intracranial injury without loss of consciousness, sequela: Secondary | ICD-10-CM | POA: Diagnosis not present

## 2022-03-29 NOTE — Progress Notes (Signed)
Patient agitated at this encounter.  On the floor refusing to get up. Frustrated by failed attempt to get his foot in his pant leg. RN and sitter assisted in helping patient change clothing and lay down for bed. Will continue to monitor with q15 minute safety checks.  1:1 Sitter in place for  safety and support. ? ? ? ? ? ? ?C Butler-Nicholson, LPN ?

## 2022-03-29 NOTE — TOC Progression Note (Addendum)
Transition of Care (TOC) - Progression Note  ? ? ?Patient Details  ?Name: Adam [name] ?MRN: [id number] ?Date of Birth: 1961-06-10 ? ?Transition of Care (TOC) CM/SW Contact  ?Dutchess Cellar, RN ?Phone Number: ?03/29/2022, 9:22 AM ? ?Clinical Narrative:    ?Confirmed with [facility] updated clinical had been sent to all [facility] in the state for review. Case has been staffed with Risk analyst @ [facility] for review as well.  ? ?Updated [facility] @ [facility] on success with ST using  Ipad and picture boards for communication.  ? ? ?  ?  ? ?Expected Discharge Plan and Services ?  ?  ?  ?  ?  ?                ?  ?  ?  ?  ?  ?  ?  ?  ?  ?  ? ? ?Social Determinants of Health (SDOH) Interventions ?  ? ?Readmission Risk Interventions ?   ? View : No data to display.  ?  ?  ?  ? ? ?

## 2022-03-29 NOTE — Progress Notes (Signed)
This Clinical research associate discussed holding patient's noon medications being that he has been asleep all morning, and has not taken his morning medication. MD was fine with this. This Clinical research associate will attempt to administer medication again, once he gets up. ?

## 2022-03-29 NOTE — Progress Notes (Signed)
Patient received his morning medications at 1442. Per MD, this writer is to hold all 1700 meds. ?

## 2022-03-29 NOTE — Plan of Care (Signed)
?  Problem: Safety: ?Goal: Violent Restraint(s) ?Outcome: Progressing ?  ?Problem: Self-Concept: ?Goal: Level of anxiety will decrease ?Outcome: Progressing ?Goal: Ability to modify response to factors that promote anxiety will improve ?Outcome: Progressing ?  ?

## 2022-03-29 NOTE — Progress Notes (Signed)
Patient is still sleeping, so, this writer will administer morning medication once he wakes up. MD will be notified. ?

## 2022-03-29 NOTE — Progress Notes (Signed)
This writer attempted to give patient his scheduled morning medication and he yelled out "no". This Clinical research associate will attempt medication administration again, once patient gets up. MD will be notified. ?

## 2022-03-29 NOTE — Progress Notes (Signed)
[facility] MD Progress Note ? ?03/29/2022 1:51 PM ?[name]  ?MRN:  [id number] ?Subjective: Follow-up 62 year old man with chronic cognitive and behavior problems.  No new complaint evident.  Sleeping adequately.  No obvious physical distress.  No major changes in behavior. ?Principal Problem: Difficulty controlling behavior as late effect of traumatic brain injury (HCC) ?Diagnosis: Principal Problem: ?  Difficulty controlling behavior as late effect of traumatic brain injury (HCC) ?Active Problems: ?  Schizophrenia, chronic condition with acute exacerbation (HCC) ?  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 ?Total Time spent with patient: 30 minutes ? 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 ?Past Medical History:  ?Past Medical History:  ?Diagnosis Date  ? Myocardial infarction [facility])   ? Schizophrenia (HCC)   ? Stroke [facility])   ? TBI (traumatic brain injury) (HCC)   ? History reviewed. No pertinent surgical history. ?Family History: History reviewed. No pertinent family history. ?Family Psychiatric  History: See previous ?Social History:  ?Social History  ? ?Substance and Sexual Activity  ?Alcohol Use Not Currently  ?   ?Social History  ? ?Substance and Sexual Activity  ?Drug Use Not Currently  ?  ?Social History  ? ?Socioeconomic History  ? Marital status: Single  ?  Spouse name: Not on file  ? Number of children: Not on file  ? Years of education: Not on file  ? Highest education level: Not on file  ?Occupational History  ? Not on file  ?Tobacco Use  ? Smoking status: Never  ?  Passive exposure: Never  ? Smokeless tobacco: Never  ?Vaping Use  ? Vaping Use: Unknown  ?Substance and Sexual Activity  ? Alcohol use: Not Currently  ? Drug use: Not Currently  ? Sexual activity: Not Currently  ?Other Topics Concern  ? Not on file  ?Social History Narrative  ? Not on file  ? ?Social Determinants of  Health  ? ?Financial Resource Strain: Not on file  ?Food Insecurity: Not on file  ?Transportation Needs: Not on file  ?Physical Activity: Not on file  ?Stress: Not on file  ?Social Connections: Not on file  ? ?Additional Social History:  ?  ?  ?  ?  ?  ?  ?  ?  ?  ?  ?  ? ?Sleep: Fair ? ?Appetite:  Fair ? ?Current Medications: ?Current Facility-Administered Medications  ?Medication Dose Route Frequency Provider Last Rate Last Admin  ? acetaminophen (TYLENOL) tablet 650 mg  650 mg Oral Q6H PRN Cherre Kothari T, MD      ? alum & mag hydroxide-simeth (MAALOX/MYLANTA) 200-200-20 MG/5ML suspension 30 mL  30 mL Oral Q4H PRN Cyris Maalouf, [name], MD   30 mL at 03/26/22 2035  ? aspirin EC tablet 81 mg  81 mg Oral Daily Carsynn Bethune, [name], MD   81 mg at 03/28/22 7482  ? atorvastatin (LIPITOR) tablet 20 mg  20 mg Oral Daily Braedyn Riggle, [name], MD   20 mg at 03/28/22 7078  ? benztropine (COGENTIN) tablet 0.5 mg  0.5 mg Oral BID Beryle Bagsby, [name], MD   0.5 mg at 03/28/22 1652  ? clonazePAM (KLONOPIN) tablet 1 mg  1 mg Oral TID Kasarah Sitts, [name], MD   1 mg at 03/28/22 1652  ? cloZAPine (CLOZARIL) tablet 300 mg  300 mg Oral QHS Lucah Petta, [name], MD   300 mg at 03/28/22 2104  ? diphenhydrAMINE (BENADRYL) capsule 50 mg  50 mg Oral Q6H PRN Lash Matulich, [name], MD   50 mg at 03/26/22 2035  ? Or  ? diphenhydrAMINE (BENADRYL) injection 50 mg  50 mg Intramuscular Q6H PRN Floy Riegler T, MD      ? gabapentin (NEURONTIN) capsule 300 mg  300 mg Oral TID Sariya Trickey T, MD   300 mg at 03/28/22 1652  ? haloperidol (HALDOL) tablet 5 mg  5 mg Oral TID Caylin Raby, [name], MD   5 mg at 03/28/22 1652  ? haloperidol (HALDOL) tablet 5 mg  5 mg Oral Q6H PRN Aedyn Mckeon, [name], MD   5 mg at 03/22/22 1421  ? Or  ? haloperidol lactate (HALDOL) injection 5 mg  5 mg Intramuscular Q6H PRN Britiney Blahnik T, MD      ? magnesium hydroxide (MILK OF MAGNESIA) suspension 30 mL  30 mL Oral Daily PRN Igor Bishop T, MD      ? metoprolol succinate (TOPROL-XL) 24 hr tablet 25 mg  25 mg  Oral Daily Yoseline Andersson, [name], MD   25 mg at 03/28/22 3762  ? paliperidone (INVEGA SUSTENNA) injection 156 mg  156 mg Intramuscular Q28 days Gerda Yin T, MD      ? temazepam (RESTORIL) capsule 15 mg  15 mg Oral QHS Magin Balbi T, MD   15 mg at 03/28/22 2104  ? ziprasidone (GEODON) injection 20 mg  20 mg Intramuscular Q12H PRN Jesyka Slaght, [name], MD   20 mg at 03/19/22 1551  ? ? ?Lab Results:  ?Results for orders placed or performed during the hospital encounter of 03/19/22 (from the past 48 hour(s))  ?CBC with Differential/Platelet     Status: None  ? Collection Time: 03/28/22  7:14 AM  ?Result Value Ref Range  ? WBC 9.3 4.0 - 10.5 K/uL  ? RBC 4.42 4.22 - 5.81 MIL/uL  ? Hemoglobin 13.2 13.0 - 17.0 g/dL  ? HCT 39.8 39.0 - 52.0 %  ? MCV 90.0 80.0 - 100.0 fL  ? MCH 29.9 26.0 - 34.0 pg  ? MCHC 33.2 30.0 - 36.0 g/dL  ? RDW 13.2 11.5 - 15.5 %  ? Platelets 208 150 - 400 K/uL  ? nRBC 0.0 0.0 - 0.2 %  ? Neutrophils Relative % 77 %  ? Neutro Abs 7.0 1.7 - 7.7 K/uL  ? Lymphocytes Relative 14 %  ? Lymphs Abs 1.3 0.7 - 4.0 K/uL  ? Monocytes Relative 8 %  ? Monocytes Absolute 0.8 0.1 - 1.0 K/uL  ? Eosinophils Relative 0 %  ? Eosinophils Absolute 0.0 0.0 - 0.5 K/uL  ? Basophils Relative 1 %  ? Basophils Absolute 0.1 0.0 - 0.1 K/uL  ? Immature Granulocytes 0 %  ? Abs Immature Granulocytes 0.04 0.00 - 0.07 K/uL  ?  Comment: Performed at [facility], [address], [place], [place] [address]  ? ? ?Blood Alcohol level:  ?Lab Results  ?Component Value Date  ? ETH <10 07/20/2021  ? ? ?Metabolic Disorder Labs: ?No results found for: HGBA1C, MPG ?No results found for: PROLACTIN ?Lab Results  ?Component Value Date  ? CHOL 167 11/20/2021  ? TRIG 107 11/20/2021  ? HDL 26 (L) 11/20/2021  ? CHOLHDL 6.4 11/20/2021  ? VLDL 21 11/20/2021  ? LDLCALC 120 (H) 11/20/2021  ? ? ?Physical Findings: ?AIMS:  , ,  ,  ,    ?CIWA:    ?COWS:    ? ?Musculoskeletal: ?Strength & Muscle Tone: within normal limits ?Gait & Station: normal ?Patient  leans: N/A ? ?Psychiatric Specialty Exam: ? ?Presentation  ?General Appearance: Disheveled ? ?Eye Contact:None ? ?Speech:Clear and Coherent ? ?Speech Volume:Increased ? ?Handedness:Right ? ? ?Mood and Affect  ?Mood:Anxious; Irritable ? ?Affect:Congruent ? ? ?Thought Process  ?Thought Processes:Disorganized ? ?Descriptions of Associations:Loose ? ?Orientation:Partial ? ?Thought Content:Scattered; Illogical ? ?History of Schizophrenia/Schizoaffective disorder:Yes ? ?Duration of Psychotic Symptoms:Greater than six months ? ?Hallucinations:No data recorded ?Ideas of Reference:-- (UTA) ? ?Suicidal Thoughts:No data recorded ?Homicidal Thoughts:No data recorded ? ?Sensorium  ?Memory:Remote Poor; Recent Poor; Immediate Poor; Other (comment) (impaired memory and cognition) ? ?Judgment:Impaired ? ?Insight:Lacking ? ? ?Executive Functions  ?Concentration:Poor ? ?Attention Span:Poor ? ?Recall:Poor ? ?Fund of Knowledge:Poor ? ?Language:Poor ? ? ?Psychomotor Activity  ?Psychomotor Activity:No data recorded ? ?Assets  ?Assets:Housing; Resilience; Social Support; Talents/Skills ? ? ?Sleep  ?Sleep:No data recorded ? ? ?Physical Exam: ?Physical Exam ?Vitals and nursing note reviewed.  ?Constitutional:   ?   Appearance: Normal appearance.  ?HENT:  ?   Head: Normocephalic and atraumatic.  ?   Mouth/Throat:  ?   Pharynx: Oropharynx is clear.  ?Eyes:  ?   Pupils: Pupils are equal, round, and reactive to light.  ?Cardiovascular:  ?   Rate and Rhythm: Normal rate and regular rhythm.  ?Pulmonary:  ?   Effort: Pulmonary effort is normal.  ?   Breath sounds: Normal breath sounds.  ?Abdominal:  ?   General: Abdomen is flat.  ?   Palpations: Abdomen is soft.  ?Musculoskeletal:     ?   General: Normal range of motion.  ?Skin: ?   General: Skin is warm and dry.  ?Neurological:  ?   General: No focal deficit present.  ?   Mental Status: He is alert. Mental status is at baseline.  ?Psychiatric:     ?   Attention and Perception: He is inattentive.      ?   Mood and Affect: Mood normal. Affect is blunt and inappropriate.     ?   Speech: He is noncommunicative.  ? ?Review of Systems  ?Unable to perform ROS: Language  ?Blood pressure (!) 130/94, pulse (!) 106,

## 2022-03-29 NOTE — Progress Notes (Signed)
SLP Cancellation Note ? ?Patient Details ?Name: Adam [name] ?MRN: [id number] ?DOB: 27-Dec-1960 ? ? ?Cancelled treatment:       ? ?SLP attempted to see pt this morning but he was sleeping. Decision made to re-visit at a later time.  ? ?[name] B. Dreama Saa, M.S., CCC-SLP, CBIS ?Speech-Language Pathologist ?Rehabilitation Services ?Office [phone] ? ?Adam [name] ?03/29/2022, 3:50 PM ?

## 2022-03-29 NOTE — Progress Notes (Addendum)
Recreation Therapy Notes ? ? ?Date: 03/29/2022 ? ?Time: 10:00 am   ? ?Location: Court yard ? ?Behavioral response: N/A ?  ?Intervention Topic: Wellness  ? ?Discussion/Intervention: ?Patient refused to attend group.  ? ?Clinical Observations/Feedback:  ?Patient refused to attend group.  ?  ?Aima Mcwhirt LRT/CTRS ? ? ? ? ? ? ? ?Akira Perusse ?03/29/2022 11:58 AM ?

## 2022-03-29 NOTE — Progress Notes (Signed)
Patient is now awake and tolerated medication administration well. Patient also allowed this writer to administer his scheduled Invega injection, into his right deltoid, without any issues. Patient did give this Clinical research associate a look, as if he was upset about receiving the shot. Patient remains safe on the unit. ?

## 2022-03-29 NOTE — TOC Progression Note (Signed)
Transition of Care (TOC) - Progression Note  ? ? ?Patient Details  ?Name: Adam [name] ?MRN: [id number] ?Date of Birth: 1961-07-26 ? ?Transition of Care (TOC) CM/SW Contact  ?Ripley Cellar, RN ?Phone Number: ?03/29/2022, 4:46 PM ? ?Clinical Narrative:    ?Possible interest from [facility]-completing referral form-pending some information needed from [employer].  ? ?Secure email sent to [facility] requesting specific [facility] information.  ? ? ?  ?  ? ?Expected Discharge Plan and Services ?  ?  ?  ?  ?  ?                ?  ?  ?  ?  ?  ?  ?  ?  ?  ?  ? ? ?Social Determinants of Health (SDOH) Interventions ?  ? ?Readmission Risk Interventions ?   ? View : No data to display.  ?  ?  ?  ? ? ?

## 2022-03-29 NOTE — Progress Notes (Signed)
Patient has been asleep since start of shift. Has 1:1 sitter in place for safety and support. Will continue to monitor with q 15 minute safety rounds. ? ? ? ? ?C Butler-Nicholson, LPN  ?

## 2022-03-29 NOTE — Plan of Care (Signed)
D- Patient alert and oriented to person and situation. Patient presented in a slightly irritable, but pleasant mood on approach. Patient has been asleep all morning and did not get up until early afternoon. Patient did have an incontinent episode of urine this morning, in which he did allow staff to change his bedding and his clothes. When this writer asked patient if he was experiencing any signs/symptoms of depression/anxiety, he said "no". Patient does not appear to be in pain and he is not in any distress. This writer is unable to assess if patient is experiencing any SI/HI/AVH. ? ?A- Scheduled medications administered to patient, per MD orders. Support and encouragement provided.  Routine safety checks conducted every 15 minutes.  ? ?R- No adverse drug reactions noted. Patient compliant with medications and treatment plan. Patient receptive, calm, and cooperative. Patient remains safe at this time. ? ?Problem: Safety: ?Goal: Violent Restraint(s) ?Outcome: Not Progressing ?  ?Problem: Education: ?Goal: Ability to state activities that reduce stress will improve ?Outcome: Not Progressing ?  ?Problem: Coping: ?Goal: Ability to identify and develop effective coping behavior will improve ?Outcome: Not Progressing ?  ?Problem: Self-Concept: ?Goal: Ability to identify factors that promote anxiety will improve ?Outcome: Not Progressing ?Goal: Level of anxiety will decrease ?Outcome: Not Progressing ?Goal: Ability to modify response to factors that promote anxiety will improve ?Outcome: Not Progressing ?  ?

## 2022-03-29 NOTE — Progress Notes (Signed)
MD was notified of patient still sleeping, and he is ok with patient receiving his medication when he wakes up. ?

## 2022-03-29 NOTE — Progress Notes (Signed)
Patient received QHS meds and snack. Pleasant and cooperative mood at this encounter. Took meds without incident. Provided patient with coloring pages. Will continue to monitor with q 15 minute safety checks.  Remains with 1:1 sitter for safety and support. ? ? ? ? ?C Butler-Nicholson, LPN ?

## 2022-03-29 NOTE — Progress Notes (Signed)
Patient was knocking on the door and when this writer went back to see what he wanted, he kept saying "home". This Clinical research associate asked if he wanted to call his Mom and he did, however, he kept saying "home" and looking out of the double doors. Patient gets frustrated when staff does not understand him, and he says "oh my God" and walks off. ?

## 2022-03-29 NOTE — Progress Notes (Signed)
1:1 Patient Hourly Rounding ? ?0800: Patient is in bed, asleep, with his assigned safety sitter present. ? ?0900: Patient is in bed, asleep, with his assigned safety sitter present. ? ?1000: Patient is still in bed sleeping, with his assigned safety sitter present. ? ?1100: Patient is lying in bed, half-awake and half-asleep, with his assigned safety sitter present. ? ?1200: Patient is still sleeping, with his assigned safety sitter present. ? ?1300: Patient continues to sleep, in bed, with his assigned safety sitter present. ? ?1400: Patient is still asleep, with his assigned safety sitter present. ? ?1500: Patient is in bed, awake, with his assigned safety sitter present. ? ?1600: Patient is in the bathroom, with his assigned safety sitter present. ? ?1700: Patient is in the dayroom, watching television, with his assigned safety sitter present. ? ?1800: Patient is in his room, with his assigned safety sitter present. ? ?1900: Patient is in bed, asleep, with his assigned safety sitter present. ?

## 2022-03-30 DIAGNOSIS — S069X0S Unspecified intracranial injury without loss of consciousness, sequela: Secondary | ICD-10-CM | POA: Diagnosis not present

## 2022-03-30 DIAGNOSIS — R4689 Other symptoms and signs involving appearance and behavior: Secondary | ICD-10-CM | POA: Diagnosis not present

## 2022-03-30 NOTE — Progress Notes (Signed)
Patient is in room sitting up on side of bed watching his tablet. He is calm no issues to note. Will continue to monitor with q 15 minute safety checks. Remains with 1:1 sitter for safety. ? ? ? ? ?C Francena Hanly, LPN ?

## 2022-03-30 NOTE — Progress Notes (Signed)
This writer attempted to notify patient's mother of fall, however, there was no answer and the mailbox was full. This Clinical research associate will attempt to call again, at a later time today. ?

## 2022-03-30 NOTE — Plan of Care (Addendum)
D- Patient alert and oriented to person and situation. Patient presents in a much better mood/affect today. Patient was sitting up in the dayroom, bright and early, with a smile on his face and watching television. Patient stated that he slept good last night and had no complaints to voice to this Probation officer. When asked patient about SI, HI, AVH, and pain, patient's response was "no". Patient also denies any signs/symptoms of depression/anxiety. With the help of his nurse last night, per patient's self-inventory, his goal for today is for him to have a "good day, not get agitated", in which he will "sing and laugh", in order to achieve his goal. ? ?A- Scheduled medications administered to patient, per MD orders. Support and encouragement provided.  Routine safety checks conducted every 15 minutes. ? ?R- No adverse drug reactions noted. Patient contracts for safety at this time. Patient compliant with medications and treatment plan. Patient receptive, calm, and cooperative. Patient interacts well with others on the unit. Patient remains safe at this time. ? ?Problem: Safety: ?Goal: Violent Restraint(s) ?Outcome: Progressing ?  ?Problem: Education: ?Goal: Ability to state activities that reduce stress will improve ?Outcome: Progressing ?  ?Problem: Coping: ?Goal: Ability to identify and develop effective coping behavior will improve ?Outcome: Progressing ?  ?Problem: Self-Concept: ?Goal: Ability to identify factors that promote anxiety will improve ?Outcome: Progressing ?Goal: Level of anxiety will decrease ?Outcome: Progressing ?Goal: Ability to modify response to factors that promote anxiety will improve ?Outcome: Progressing ?  ?

## 2022-03-30 NOTE — Progress Notes (Signed)
Patient is in a pleasant mood.  Happy to be getting his old fingernail polish removed. Very anxious and excited about being able to get his nails repainted. He is med compliant and took his QHS meds without incident. He continues to be with 1:1 sitter for safety. Will continue to monitor with q 15 minute safety rounds. ? ? ? ? ? ?C Butler-Nicholson, LPN ?

## 2022-03-30 NOTE — Progress Notes (Signed)
Patient bed linen and clothes changed after incontinence episode. Patient tolerated without incident.  Will continue to monitor with q15 minute safety checks. Continues to be on 1:1 with sitter for safety and monitoring. ? ? ? ? ?C Butler-Nicholson, LPN ? ? ? ? ?

## 2022-03-30 NOTE — Progress Notes (Signed)
This Probation officer attempted, for a second tone, to call patient's mother to inform her of his fall. However, there still was no answer and again, the mailbox is full and this Probation officer could not leave a message. ?

## 2022-03-30 NOTE — Progress Notes (Signed)
[facility] MD Progress Note ? ?03/30/2022 1:41 PM ?Adam [name]  ?MRN:  [id number] ?Subjective: Follow-up 61 year old man with global stroke chronic impaired speech possible psychotic disorder.  Patient had a fall just after lunch today but did not lose consciousness and does not appear to have suffered any injury.  No sign of any bony abnormality or bleeding and the patient is not complaining of any pain.  Behavior has been stable without any new changes. ?Principal Problem: Difficulty controlling behavior as late effect of traumatic brain injury (HCC) ?Diagnosis: Principal Problem: ?  Difficulty controlling behavior as late effect of traumatic brain injury (HCC) ?Active Problems: ?  Schizophrenia, chronic condition with acute exacerbation (HCC) ?  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 ?Total Time spent with patient: 30 minutes ? ?Past Psychiatric History: Past history of behavior problems related to head injury and schizophrenia ? ?Past Medical History:  ?Past Medical History:  ?Diagnosis Date  ? Myocardial infarction [facility])   ? Schizophrenia (HCC)   ? Stroke [facility])   ? TBI (traumatic brain injury) (HCC)   ? History reviewed. No pertinent surgical history. ?Family History: History reviewed. No pertinent family history. ?Family Psychiatric  History: See previous ?Social History:  ?Social History  ? ?Substance and Sexual Activity  ?Alcohol Use Not Currently  ?   ?Social History  ? ?Substance and Sexual Activity  ?Drug Use Not Currently  ?  ?Social History  ? ?Socioeconomic History  ? Marital status: Single  ?  Spouse name: Not on file  ? Number of children: Not on file  ? Years of education: Not on file  ? Highest education level: Not on file  ?Occupational History  ? Not on file  ?Tobacco Use  ? Smoking status: Never  ?  Passive exposure: Never  ? Smokeless tobacco: Never  ?Vaping Use  ? Vaping Use: Unknown  ?Substance and Sexual Activity  ? Alcohol use: Not Currently  ? Drug use: Not  Currently  ? Sexual activity: Not Currently  ?Other Topics Concern  ? Not on file  ?Social History Narrative  ? Not on file  ? ?Social Determinants of Health  ? ?Financial Resource Strain: Not on file  ?Food Insecurity: Not on file  ?Transportation Needs: Not on file  ?Physical Activity: Not on file  ?Stress: Not on file  ?Social Connections: Not on file  ? ?Additional Social History:  ?  ?  ?  ?  ?  ?  ?  ?  ?  ?  ?  ? ?Sleep: Fair ? ?Appetite:  Fair ? ?Current Medications: ?Current Facility-Administered Medications  ?Medication Dose Route Frequency Provider Last Rate Last Admin  ? acetaminophen (TYLENOL) tablet 650 mg  650 mg Oral Q6H PRN Arjen Deringer T, MD      ? alum & mag hydroxide-simeth (MAALOX/MYLANTA) 200-200-20 MG/5ML suspension 30 mL  30 mL Oral Q4H PRN Lopez Dentinger, [name], MD   30 mL at 03/26/22 2035  ? aspirin EC tablet 81 mg  81 mg Oral Daily Atanacio Melnyk, [name], MD   81 mg at 03/30/22 0805  ? atorvastatin (LIPITOR) tablet 20 mg  20 mg Oral Daily Lanisha Stepanian, [name], MD   20 mg at 03/30/22 0805  ? benztropine (COGENTIN) tablet 0.5 mg  0.5 mg Oral BID Karstyn Birkey, [name], MD   0.5 mg at 03/30/22 0806  ? clonazePAM (KLONOPIN) tablet 1 mg  1 mg Oral TID Yisrael Obryan, [name], MD   1 mg at 03/30/22 1156  ?  cloZAPine (CLOZARIL) tablet 300 mg  300 mg Oral QHS Sherrel Shafer, [name], MD   300 mg at 03/29/22 2123  ? diphenhydrAMINE (BENADRYL) capsule 50 mg  50 mg Oral Q6H PRN Shakeya Kerkman, [name], MD   50 mg at 03/26/22 2035  ? Or  ? diphenhydrAMINE (BENADRYL) injection 50 mg  50 mg Intramuscular Q6H PRN Arabel Barcenas T, MD      ? gabapentin (NEURONTIN) capsule 300 mg  300 mg Oral TID Sheriden Archibeque, [name], MD   300 mg at 03/30/22 1156  ? haloperidol (HALDOL) tablet 5 mg  5 mg Oral TID Beckham Capistran, [name], MD   5 mg at 03/30/22 1156  ? haloperidol (HALDOL) tablet 5 mg  5 mg Oral Q6H PRN Rhylei Mcquaig, [name], MD   5 mg at 03/22/22 1421  ? Or  ? haloperidol lactate (HALDOL) injection 5 mg  5 mg Intramuscular Q6H PRN Caralyn Twining T, MD      ? magnesium  hydroxide (MILK OF MAGNESIA) suspension 30 mL  30 mL Oral Daily PRN Konstance Happel T, MD      ? metoprolol succinate (TOPROL-XL) 24 hr tablet 25 mg  25 mg Oral Daily Gurnoor Sloop, [name], MD   25 mg at 03/30/22 5643  ? paliperidone (INVEGA SUSTENNA) injection 156 mg  156 mg Intramuscular Q28 days Sheral Pfahler, [name], MD   156 mg at 03/29/22 1445  ? temazepam (RESTORIL) capsule 15 mg  15 mg Oral QHS Rokia Bosket T, MD   15 mg at 03/29/22 2122  ? ziprasidone (GEODON) injection 20 mg  20 mg Intramuscular Q12H PRN Rhylan Kagel, [name], MD   20 mg at 03/19/22 1551  ? ? ?Lab Results: No results found for this or any previous visit (from the past 48 hour(s)). ? ?Blood Alcohol level:  ?Lab Results  ?Component Value Date  ? ETH <10 07/20/2021  ? ? ?Metabolic Disorder Labs: ?No results found for: HGBA1C, MPG ?No results found for: PROLACTIN ?Lab Results  ?Component Value Date  ? CHOL 167 11/20/2021  ? TRIG 107 11/20/2021  ? HDL 26 (L) 11/20/2021  ? CHOLHDL 6.4 11/20/2021  ? VLDL 21 11/20/2021  ? LDLCALC 120 (H) 11/20/2021  ? ? ?Physical Findings: ?AIMS:  , ,  ,  ,    ?CIWA:    ?COWS:    ? ?Musculoskeletal: ?Strength & Muscle Tone: within normal limits ?Gait & Station: normal ?Patient leans: N/A ? ?Psychiatric Specialty Exam: ? ?Presentation  ?General Appearance: Disheveled ? ?Eye Contact:None ? ?Speech:Clear and Coherent ? ?Speech Volume:Increased ? ?Handedness:Right ? ? ?Mood and Affect  ?Mood:Anxious; Irritable ? ?Affect:Congruent ? ? ?Thought Process  ?Thought Processes:Disorganized ? ?Descriptions of Associations:Loose ? ?Orientation:Partial ? ?Thought Content:Scattered; Illogical ? ?History of Schizophrenia/Schizoaffective disorder:Yes ? ?Duration of Psychotic Symptoms:Greater than six months ? ?Hallucinations:No data recorded ?Ideas of Reference:-- (UTA) ? ?Suicidal Thoughts:No data recorded ?Homicidal Thoughts:No data recorded ? ?Sensorium  ?Memory:Remote Poor; Recent Poor; Immediate Poor; Other (comment) (impaired memory and  cognition) ? ?Judgment:Impaired ? ?Insight:Lacking ? ? ?Executive Functions  ?Concentration:Poor ? ?Attention Span:Poor ? ?Recall:Poor ? ?Fund of Knowledge:Poor ? ?Language:Poor ? ? ?Psychomotor Activity  ?Psychomotor Activity:No data recorded ? ?Assets  ?Assets:Housing; Resilience; Social Support; Talents/Skills ? ? ?Sleep  ?Sleep:No data recorded ? ? ?Physical Exam: ?Physical Exam ?Vitals and nursing note reviewed.  ?Constitutional:   ?   Appearance: Normal appearance.  ?HENT:  ?   Head: Normocephalic and atraumatic.  ?   Mouth/Throat:  ?   Pharynx: Oropharynx is  clear.  ?Eyes:  ?   Pupils: Pupils are equal, round, and reactive to light.  ?Cardiovascular:  ?   Rate and Rhythm: Normal rate and regular rhythm.  ?Pulmonary:  ?   Effort: Pulmonary effort is normal.  ?   Breath sounds: Normal breath sounds.  ?Abdominal:  ?   General: Abdomen is flat.  ?   Palpations: Abdomen is soft.  ?Musculoskeletal:     ?   General: Normal range of motion.  ?Skin: ?   General: Skin is warm and dry.  ?Neurological:  ?   General: No focal deficit present.  ?   Mental Status: He is alert. Mental status is at baseline.  ?Psychiatric:     ?   Attention and Perception: He is inattentive.     ?   Mood and Affect: Mood normal. Affect is inappropriate.     ?   Speech: He is noncommunicative.  ? ?Review of Systems  ?Unable to perform ROS: Language  ?Blood pressure 115/87, pulse (!) 105, temperature 98.4 ?F (36.9 ?C), temperature source Oral, resp. rate 17, height 5\' 8"  (1.727 m), weight 85.8 kg, SpO2 100 %. Body mass index is 28.76 kg/m?. ? ? ?Treatment Plan Summary: ?Medication management and Plan no change to current medication.  Supportive counseling and encouragement.  I do not think there is any indication at this point for any x-ray or other evaluation after the minor fall in his bathroom. ? ? , MD ?03/30/2022, 1:41 PM ? ?

## 2022-03-30 NOTE — Progress Notes (Signed)
?   03/30/22 1241  ?What Happened  ?Was fall witnessed? No  ?Patients activity before fall bathroom-unassisted  ?Point of contact buttocks  ?Was patient injured? No  ?Patient found in bathroom  ?Found by Staff-comment ?(sitter found patient in bathroom)  ?Stated prior activity bathroom-unassisted  ?Follow Up  ?MD notified [name], J  ?Time MD notified 1242  ?Adult Fall Risk Assessment  ?Risk Factor Category (scoring not indicated) Fall has occurred during this admission (document High fall risk)  ?Age 61  ?Fall History: Fall within 6 months prior to admission 0  ?Elimination; Bowel and/or Urine Incontinence 2  ?Elimination; Bowel and/or Urine Urgency/Frequency 0  ?Medications: includes PCA/Opiates, Anti-convulsants, Anti-hypertensives, Diuretics, Hypnotics, Laxatives, Sedatives, and Psychotropics 5  ?Patient Care Equipment 0  ?Mobility-Assistance 2  ?Mobility-Gait 2  ?Mobility-Sensory Deficit 0  ?Altered awareness of immediate physical environment 1  ?Impulsiveness 2  ?Lack of understanding of one's physical/cognitive limitations 4  ?Total Score 19  ?Patient Fall Risk Level High fall risk  ?Adult Fall Risk Interventions  ?Required Bundle Interventions *See Row Information* High fall risk - low, moderate, and high requirements implemented  ?Additional Interventions Reorient/diversional activities with confused patients;Safety Sitter/Safety Rounder;Use of appropriate toileting equipment (bedpan, BSC, etc.)  ?Screening for Fall Injury Risk (To be completed on HIGH fall risk patients) - Assessing Need for Floor Mats  ?Risk For Fall Injury- Criteria for Floor Mats None identified - No additional interventions needed  ?Neurological  ?Neuro (WDL) WDL  ?Level of Consciousness Alert  ?Orientation Level Oriented to person;Oriented to situation  ?Cognition Follows commands  ?Speech Expressive aphasia  ?Musculoskeletal  ?Musculoskeletal (WDL) WDL  ?Assistive Device None  ?Integumentary  ?Integumentary (WDL) WDL  ? ? ?

## 2022-03-30 NOTE — Progress Notes (Signed)
1:1 Patient Hourly Rounding ? ?0800: Patient is in the dayroom, eating breakfast, with his assigned safety sitter present. ? ?0900: Patient is in the dayroom, with his assigned Recruitment consultant. ? ?1000: Patient is in the bathroom, taking a shower, with his assigned safety sitter present. ? ?1100: Patient is in the dayroom, with his assigned safety sitter present. ? ?1200: Patient is in the dayroom, eating lunch, with his assigned safety sitter present. ? ?1300: Patient is in the bathroom, with his assigned safety sitter present. ? ?1400: Patient is in bed, asleep, with his assigned safety sitter present. ? ?1500: Patient is sitting in the dayroom, quietly, with his assigned safety sitter present. ? ?1600: Patient is in bed, asleep, with his assigned safety sitter present. ? ?1700: Patient is in the dayroom, eating dinner, with his assigned safety sitter present. ? ?1800: Patient is in his room, awake, with his assigned safety sitter present. ? ?1900: Patient is standing in the hallway, with his assigned safety sitter present. ?

## 2022-03-31 DIAGNOSIS — S069X0S Unspecified intracranial injury without loss of consciousness, sequela: Secondary | ICD-10-CM | POA: Diagnosis not present

## 2022-03-31 DIAGNOSIS — R4689 Other symptoms and signs involving appearance and behavior: Secondary | ICD-10-CM | POA: Diagnosis not present

## 2022-03-31 LAB — URINALYSIS, ROUTINE W REFLEX MICROSCOPIC
Bilirubin Urine: NEGATIVE
Glucose, UA: NEGATIVE mg/dL
Ketones, ur: NEGATIVE mg/dL
Nitrite: POSITIVE — AB
Protein, ur: 30 mg/dL — AB
RBC / HPF: >50 RBC/hpf — ABNORMAL HIGH (ref 0–5)
Specific Gravity, Urine: 1.014 (ref 1.005–1.030)
Squamous Epithelial / HPF: NONE SEEN (ref 0–5)
WBC, UA: >50 WBC/hpf — ABNORMAL HIGH (ref 0–5)
pH: 7 (ref 5.0–8.0)

## 2022-03-31 MED ORDER — ATROPINE SULFATE 1 % OP SOLN
1.0000 [drp] | Freq: Four times a day (QID) | OPHTHALMIC | Status: DC
  Administered 2022-03-31 – 2023-02-19 (×1232): 1 [drp] via SUBLINGUAL
  Filled 2022-03-31 (×55): qty 2

## 2022-03-31 NOTE — Plan of Care (Signed)
D: Pt alert states no when asked if he is depressed or anxious today. Pt observed as being angry, agitated, irritable, and resistant to care. It's reported that had an incontinent episode early this morning prior to shift change. Pt refused to allow for a full linen change at time of episode. Pt was difficult to wake this morning, MD was notified of pt not wanting to wake and asked about medications being given at a later time, MD approved. MD also made aware of pt's copious amount of saliva coming from his mouth. MD order medication to assist with pt's excessive saliva. When asked pt replied no experiencing any pain at this time. When asked pt replied no experiencing any SI/HI, or AVH at this time.  ? ?Pt has had a difficult morning d/t the power protector of the electrical socket needing to be reset and his cell phone not charging during the night. Pt had a difficult time being able to communicate this problem. Since resolved pt has calmed down and in laying in his bed napping. Pt shows no signs of distress, breathing is unlabored, fall and rise of chest can be seen.  ? ?By late afternoon/early evening pt observed in dayroom smiling, calm, and cooperative.  ? ?Pt's evening medication had to be moved to a later time d/t time pt received morning dose of scheduled medications. MD okay with and agrees evening medications needed to be moved to more appropriate time so that medications were not given too closely to one another. Also MD aware and okay with pt not receiving first dose of oral drop d/t pt not being available d/t pt sleeping and not waking this afternoon.  ? ?A: Scheduled medications administered to pt, per MD orders. Support and encouragement provided. Frequent verbal contact made. Routine safety checks conducted q15 minutes. Pt remains on continuous 1:1 with Recruitment consultant.    ? ?R: No adverse drug reactions noted. Pt verbally contracts for safety at this time. Pt compliant with medications and treatment  plan. Pt interacts poorly with staff on the unit for majority of the day. Pt remains safe at this time. Will continue to monitor.  ? ?Problem: Education: ?Goal: Ability to state activities that reduce stress will improve ?Outcome: Not Progressing ?  ?Problem: Coping: ?Goal: Ability to identify and develop effective coping behavior will improve ?Outcome: Not Progressing ?  ?

## 2022-03-31 NOTE — Progress Notes (Signed)
This writer attempted to wake patient for medications, unable to wake patient. This Clinical research associate will administer medication once patient awakens. MD notified and aware. MD is okay with patient receiving medication once he awakens. ?

## 2022-03-31 NOTE — Progress Notes (Signed)
[facility] MD Progress Note ? ?03/31/2022 3:20 PM ?Adam [name]  ?MRN:  [id number] ? ?Principal Problem: Difficulty controlling behavior as late effect of traumatic brain injury (HCC) ?Diagnosis: Principal Problem: ?  Difficulty controlling behavior as late effect of traumatic brain injury (HCC) ?Active Problems: ?  Schizophrenia, chronic condition with acute exacerbation (HCC) ?  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   ? ?Interval History ?Patient was seen today for re-evaluation.  Nursing reports patient is irritable and agitated. He willingly took his morning medications. RN observed pt has copious amount of saliva coming from mouth since awake and has notified MD. ? ?Subjective:  Per sitter, patient`s behavior has been a little more irritable today; he refused to go outside that is unusual and urinated on himself. ? ?Will check UA when possible. ? ?Total Time spent with patient: 15 minutes ? ?Past Psychiatric History: see H&P ? ?Past Medical History:  ?Past Medical History:  ?Diagnosis Date  ? Myocardial infarction [facility])   ? Schizophrenia (HCC)   ? Stroke [facility])   ? TBI (traumatic brain injury) (HCC)   ? History reviewed. No pertinent surgical history. ?Family History: History reviewed. No pertinent family history. ?Family Psychiatric  History:  ?Social History:  ?Social History  ? ?Substance and Sexual Activity  ?Alcohol Use Not Currently  ?   ?Social History  ? ?Substance and Sexual Activity  ?Drug Use Not Currently  ?  ?Social History  ? ?Socioeconomic History  ? Marital status: Single  ?  Spouse name: Not on file  ? Number of children: Not on file  ? Years of education: Not on file  ? Highest education level: Not on file  ?Occupational History  ? Not on file  ?Tobacco Use  ? Smoking status: Never  ?  Passive exposure: Never  ? Smokeless tobacco: Never  ?Vaping Use  ? Vaping Use: Unknown   ?Substance and Sexual Activity  ? Alcohol use: Not Currently  ? Drug use: Not Currently  ? Sexual activity: Not Currently  ?Other Topics Concern  ? Not on file  ?Social History Narrative  ? Not on file  ? ?Social Determinants of Health  ? ?Financial Resource Strain: Not on file  ?Food Insecurity: Not on file  ?Transportation Needs: Not on file  ?Physical Activity: Not on file  ?Stress: Not on file  ?Social Connections: Not on file  ? ?Additional Social History:  ?  ?  ?  ?  ?  ?  ?  ?  ?  ?  ?  ? ?Sleep: Fair ? ?Appetite:  Poor ? ?Current Medications: ?Current Facility-Administered Medications  ?Medication Dose Route Frequency Provider Last Rate Last Admin  ? acetaminophen (TYLENOL) tablet 650 mg  650 mg Oral Q6H PRN [name], [name], MD      ? alum & mag hydroxide-simeth (MAALOX/MYLANTA) 200-200-20 MG/5ML suspension 30 mL  30 mL Oral Q4H PRN [name], [name], MD   30 mL at 03/26/22 2035  ? aspirin EC tablet 81 mg  81 mg Oral Daily [name], [name], MD   81 mg at 03/31/22 1118  ? atorvastatin (LIPITOR) tablet 20 mg  20 mg Oral Daily [name], [name], MD   20 mg at 03/31/22 1120  ? atropine 1 % ophthalmic solution 1 drop  1 drop Sublingual QID [name], MD      ? benztropine (COGENTIN) tablet 0.5 mg  0.5 mg Oral BID [name], [name], MD   0.5 mg at 03/31/22 1120  ? clonazePAM (KLONOPIN) tablet 1 mg  1 mg Oral TID [name], [name], MD   1 mg at 03/31/22 1120  ? cloZAPine (CLOZARIL) tablet 300 mg  300 mg Oral QHS [name], [name], MD   300 mg at 03/30/22 2114  ? diphenhydrAMINE (BENADRYL) capsule 50 mg  50 mg Oral Q6H PRN [name], [name], MD   50 mg at 03/26/22 2035  ? Or  ? diphenhydrAMINE (BENADRYL) injection 50 mg  50 mg Intramuscular Q6H PRN [name], [name], MD      ? gabapentin (NEURONTIN) capsule 300 mg  300 mg Oral TID [name], [name], MD   300 mg at 03/31/22 1120  ? haloperidol (HALDOL) tablet 5 mg  5 mg Oral TID [name], [name], MD   5 mg at 03/31/22 1120  ? haloperidol (HALDOL) tablet 5 mg  5 mg Oral Q6H PRN  [name], [name], MD   5 mg at 03/22/22 1421  ? Or  ? haloperidol lactate (HALDOL) injection 5 mg  5 mg Intramuscular Q6H PRN [name], [name], MD      ? magnesium hydroxide (MILK OF MAGNESIA) suspension 30 mL  30 mL Oral Daily PRN [name], [name], MD      ? metoprolol succinate (TOPROL-XL) 24 hr tablet 25 mg  25 mg Oral Daily [name], [name], MD   25 mg at 03/31/22 1121  ? paliperidone (INVEGA SUSTENNA) injection 156 mg  156 mg Intramuscular Q28 days [name], [name], MD   156 mg at 03/29/22 1445  ? temazepam (RESTORIL) capsule 15 mg  15 mg Oral QHS [name], [name], MD   15 mg at 03/30/22 2114  ? ziprasidone (GEODON) injection 20 mg  20 mg Intramuscular Q12H PRN [name], [name], MD   20 mg at 03/19/22 1551  ? ? ?Lab Results: No results found for this or any previous visit (from the past 48 hour(s)). ? ?Blood Alcohol level:  ?Lab Results  ?Component Value Date  ? ETH <10 07/20/2021  ? ? ?Metabolic Disorder Labs: ?No results found for: HGBA1C, MPG ?No results found for: PROLACTIN ?Lab Results  ?Component Value Date  ? CHOL 167 11/20/2021  ? TRIG 107 11/20/2021  ? HDL 26 (L) 11/20/2021  ? CHOLHDL 6.4 11/20/2021  ? VLDL 21 11/20/2021  ? LDLCALC 120 (H) 11/20/2021  ? ? ?Physical Findings: ?AIMS:  , ,  ,  ,    ?CIWA:    ?COWS:    ? ?Musculoskeletal: ?Strength & Muscle Tone: within normal limits ?Gait & Station: normal ?Patient leans: N/A ? ?Psychiatric Specialty Exam: ? ?Presentation  ?General Appearance: Disheveled ? ?Eye Contact:None ? ?Speech:Clear and Coherent ? ?Speech Volume:Increased ? ?Handedness:Right ? ? ?Mood and Affect  ?Mood:Anxious; Irritable ? ?Affect:Congruent ? ? ?Thought Process  ?Thought Processes:Disorganized ? ?Descriptions of Associations:Loose ? ?Orientation:Partial ? ?Thought Content:Scattered; Illogical ? ?History of Schizophrenia/Schizoaffective disorder:Yes ? ?Duration of Psychotic Symptoms:Greater than six months ? ?Hallucinations:No data recorded ?Ideas of Reference:-- (UTA) ? ?Suicidal  Thoughts:No data recorded ?Homicidal Thoughts:No data recorded ? ?Sensorium  ?Memory:Remote Poor; Recent Poor; Immediate Poor; Other (comment) (impaired memory and cognition) ? ?Judgment:Impaired ? ?Insight:Lacking ? ? ?Executive Functions  ?Concentration:Poor ? ?Attention Span:Poor ? ?Recall:Poor ? ?Fund of Knowledge:Poor ? ?Language:Poor ? ? ?Psychomotor Activity  ?Psychomotor Activity:No data recorded ? ?Assets  ?Assets:Housing; Resilience; Social Support; Talents/Skills ? ? ?Sleep  ?Sleep:No data recorded ? ? ?Physical  Exam: ?Physical Exam ?ROS ?Blood pressure (!) 138/94, pulse (!) 103, temperature 97.9 ?F (36.6 ?C), temperature source Oral, resp. rate 17, height 5\' 8"  (1.727 m), weight 85.8 kg, SpO2 100 %. Body mass index is 28.76 kg/m?. ? ? ?Treatment Plan Summary: ?Daily contact with patient to assess and evaluate symptoms and progress in treatment and Medication management ?Plan no change to current medication.  Supportive counseling and encouragement. Will check UA to r/o UTI due to increased agitation. Will add Atropine SL drops for sialorrhea. ?-continue scheduled medications: ? aspirin EC  81 mg Oral Daily  ? atorvastatin  20 mg Oral Daily  ? atropine  1 drop Sublingual QID  ? benztropine  0.5 mg Oral BID  ? clonazePAM  1 mg Oral TID  ? cloZAPine  300 mg Oral QHS  ? gabapentin  300 mg Oral TID  ? haloperidol  5 mg Oral TID  ? metoprolol succinate  25 mg Oral Daily  ? paliperidone  156 mg Intramuscular Q28 days  ? temazepam  15 mg Oral QHS  ? ?-continue PRN medications. ? acetaminophen, alum & mag hydroxide-simeth, diphenhydrAMINE **OR** diphenhydrAMINE, haloperidol **OR** haloperidol lactate, magnesium hydroxide, ziprasidone ? ?-[name], MD ?03/31/2022, 3:20 PM ? ?

## 2022-03-31 NOTE — Progress Notes (Signed)
Pt finally awake and in bathroom. Pt is irritable and agitated. Pt received full linen change d/t incontinent episode. Pt initially refused vital signs, however finally agreed to a full set then willingly took his morning medications. This Clinical research associate observed pt has copious amount of saliva coming from mouth since awake and has notified MD. MD states she will place new orders. ?

## 2022-03-31 NOTE — Progress Notes (Signed)
1:1 Patient Hourly Rounding ? ?0800: Patient is in bedroom sleeping, with his assigned safety sitter present. ? ?0900: Patient is in bedroom sleeping, with his assigned safety sitter present. ? ?1000: Patient is in his bedroom sleeping, with his assigned safety sitter present. ? ?1100: Pt is in bathroom, assigned sitter and this Clinical research associate provided full linen change.  ? ?1200: Pt is in dayroom, with assigned safety sitter present.  ? ?1300: Pt in bedroom, with assigned safety sitter present.  ? ?1400: Pt in bedroom, with assigned safety sitter present. ? ?1500: Pt in bedroom, with assigned safety sitter present. ? ?1600: Pt in bedroom, with assigned safety sitter present.  ? ?1700: Pt in dayroom, with assigned safety sitter present.  ? ?1800: Pt is outside in courtyard, with MHT's present. ? ?1900: Pt in bedroom, with MHT present. ?

## 2022-03-31 NOTE — Progress Notes (Signed)
This writer attempted for the second time to wake patient for medication administration. Pt is sleeping, unlabored breathing, able to see raise and fall of chest, patient shows no sign of distress. This writer is unable to awaken patient at this time. This Clinical research associate with attempt to awaken pt again at a later time.  ?

## 2022-03-31 NOTE — Group Note (Signed)
LCSW Group Therapy Note ? ?Group Date: 03/31/2022 ?Start Time: 1300 ?End Time: 1400 ? ? ?Type of Therapy and Topic:  Group Therapy - How To Cope with Nervousness about Discharge  ? ?Participation Level:  Did Not Attend  ? ?Description of Group ?This process group involved identification of patients' feelings about discharge. Some of them are scheduled to be discharged soon, while others are new admissions, but each of them was asked to share thoughts and feelings surrounding discharge from the hospital. One common theme was that they are excited at the prospect of going home, while another was that many of them are apprehensive about sharing why they were hospitalized. Patients were given the opportunity to discuss these feelings with their peers in preparation for discharge. ? ?Therapeutic Goals ? ?Patient will identify their overall feelings about pending discharge. ?Patient will think about how they might proactively address issues that they believe will once again arise once they get home (i.e. with parents). ?Patients will participate in discussion about having hope for change. ? ? ?Summary of Patient Progress:   ?Group was not held due to staffing shortages; group will resume once staffing is adequate.  ? ? ?Therapeutic Modalities ?Cognitive Behavioral Therapy ? ? ?Lareen Mullings W Cyanna Neace, LCSWA ?03/31/2022  3:10 PM   ?

## 2022-03-31 NOTE — Progress Notes (Signed)
Patient alert and oriented to person and place with periods of confusion to time and situation. Patient is on 1:1 for safety no distress noted, he was appropriate this evening tolerated medication regimen and receptive to staff. 15 minutes safety checks maintained will continue to monitor.  ?

## 2022-04-01 DIAGNOSIS — S069X0S Unspecified intracranial injury without loss of consciousness, sequela: Secondary | ICD-10-CM | POA: Diagnosis not present

## 2022-04-01 DIAGNOSIS — R4689 Other symptoms and signs involving appearance and behavior: Secondary | ICD-10-CM | POA: Diagnosis not present

## 2022-04-01 MED ORDER — SULFAMETHOXAZOLE-TRIMETHOPRIM 800-160 MG PO TABS
1.0000 | ORAL_TABLET | Freq: Two times a day (BID) | ORAL | Status: AC
  Administered 2022-04-01 – 2022-04-05 (×9): 1 via ORAL
  Filled 2022-04-01 (×10): qty 1

## 2022-04-01 NOTE — Progress Notes (Signed)
1:1 Patient Hourly Rounding ? ?0800: Patient is in dayroom, with assigned sitter present.  ? ?0900: Patient is in bedroom, assigned sitter present. ? ?1000: Patient is in bedroom, with assigned sitter present. ? ?1100: Patient is in bedroom, with assigned sitter present. ? ?1200: Patient in dayroom, with assigned sitter present. ? ?1300: Patient in dayroom, with this Clinical research associate present. ? ?1400: Patient in bedroom, with assigned sitter present. ? ?1500: Patient in hallway, with assigned sitter present. ? ?1600: Patient in bedroom, with assigned sitter present. ? ?1700: Patient in dayroom, with assigned sitter present. ? ?1800: Patient in main milieu cafeteria, with assigned sitter present. ? ?1900: Patient in the bedroom, with assigned sitter present. ? ?1900: Patient in bedroom, with assigned sitter. ?

## 2022-04-01 NOTE — Progress Notes (Signed)
Patient remains on 1:1 for safety with a sitter present. Pt calm and pleasant at this time and without complaint. Pt being monitored Q 15 minutes as well as being on 1:1 Pt remains safe on the unit.  ?

## 2022-04-01 NOTE — Group Note (Signed)
LCSW Group Therapy Note ? ?Group Date: 04/01/2022 ?Start Time: 1330 ?End Time: 1430 ? ? ?Type of Therapy and Topic:  Group Therapy - Healthy vs Unhealthy Coping Skills ? ?Participation Level:  Did Not Attend  ? ?Description of Group ?The focus of this group was to determine what unhealthy coping techniques typically are used by group members and what healthy coping techniques would be helpful in coping with various problems. Patients were guided in becoming aware of the differences between healthy and unhealthy coping techniques. Patients were asked to identify 2-3 healthy coping skills they would like to learn to use more effectively. ? ?Therapeutic Goals ?Patients learned that coping is what human beings do all day long to deal with various situations in their lives ?Patients defined and discussed healthy vs unhealthy coping techniques ?Patients identified their preferred coping techniques and identified whether these were healthy or unhealthy ?Patients determined 2-3 healthy coping skills they would like to become more familiar with and use more often. ?Patients provided support and ideas to each other ? ? ?Summary of Patient Progress:  Due to limited staffing, group was not held on the unit.  ? ? ?Therapeutic Modalities ?Cognitive Behavioral Therapy ?Motivational Interviewing ? ?Adam [name] Adam [name], LCSWA ?04/01/2022  4:08 PM   ?

## 2022-04-01 NOTE — Plan of Care (Signed)
?  D: Pt alert states no when asked if he is depressed or anxious today. Pt observed as initially being in a pleasant mood. When asked pt replied no experiencing any pain at this time. When asked pt replied no experiencing any SI/HI, or AVH at this time.  ?  ?Pt initially refused morning medications however with some coaching and the help of a familiar face (April for [facility]S) pt agreed to take medications. Pt was very excited to see her. MD informed and aware of late medication administration. MD was accepting of medication given off schedule.  Pt's mood/affect for the remainder of the day was bright, calm, and cooperative. This evening pt communicated he would like to come off the hall. Pt came off back hall with sitter and interacted with others for a few hours, all appeared to go well with pt and others. ?  ?A: Scheduled medications administered to pt, per MD orders. Support and encouragement provided. Frequent verbal contact made. Routine safety checks conducted q15 minutes. Pt remains on continuous 1:1 with Recruitment consultant.    ?  ?R: No adverse drug reactions noted. Pt verbally contracts for safety at this time. Pt compliant with medications and treatment plan. Pt interacts poorly with staff on the unit at times of fustration. Pt remains safe at this time. Will continue to monitor.  ?  ?Problem: Education: ?Goal: Ability to state activities that reduce stress will improve ?Outcome: Not Progressing ?  ?Problem: Coping: ?Goal: Ability to identify and develop effective coping behavior will improve ?Outcome: Not Progressing ?  ?

## 2022-04-01 NOTE — Progress Notes (Signed)
Patient alert and oriented x 2 with periods of confusion to place and situation unable to assess SI/HI/AVH, he was not receptive to staff this evening, he was agitated and irritable and even refused evening medication. Patient continues to be on 1:1 sitter at bedside, no distress noted 15 minute safety checks maintained will continue to monitor closely.   ?

## 2022-04-01 NOTE — Progress Notes (Signed)
[facility] MD Progress Note ? ?04/01/2022 12:20 PM ?Adam [name]  ?MRN:  [id number] ? ?Principal Problem: Difficulty controlling behavior as late effect of traumatic brain injury (HCC) ?Diagnosis: Principal Problem: ?  Difficulty controlling behavior as late effect of traumatic brain injury (HCC) ?Active Problems: ?  Schizophrenia, chronic condition with acute exacerbation (HCC) ?  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   ? ?Interval History ?Patient was seen today for re-evaluation.  Nursing reports patient is still irritable and agitated.  ? ?Subjective:  Per sitter, patient`s behavior again today has been a little more irritable and he urinated on himself. ?UA checked with signs of UTI, that can explain patient`s behavioral change. ?Patient said "Thank you" to me, smiled and did not participate in further conversation. ? ?Total Time spent with patient: 15 minutes ? ?Past Psychiatric History: see H&P ? ?Past Medical History:  ?Past Medical History:  ?Diagnosis Date  ? Myocardial infarction [facility])   ? Schizophrenia (HCC)   ? Stroke [facility])   ? TBI (traumatic brain injury) (HCC)   ? History reviewed. No pertinent surgical history. ?Family History: History reviewed. No pertinent family history. ?Family Psychiatric  History:  ?Social History:  ?Social History  ? ?Substance and Sexual Activity  ?Alcohol Use Not Currently  ?   ?Social History  ? ?Substance and Sexual Activity  ?Drug Use Not Currently  ?  ?Social History  ? ?Socioeconomic History  ? Marital status: Single  ?  Spouse name: Not on file  ? Number of children: Not on file  ? Years of education: Not on file  ? Highest education level: Not on file  ?Occupational History  ? Not on file  ?Tobacco Use  ? Smoking status: Never  ?  Passive exposure: Never  ? Smokeless tobacco: Never  ?Vaping Use  ? Vaping Use: Unknown  ?Substance and Sexual Activity  ?  Alcohol use: Not Currently  ? Drug use: Not Currently  ? Sexual activity: Not Currently  ?Other Topics Concern  ? Not on file  ?Social History Narrative  ? Not on file  ? ?Social Determinants of Health  ? ?Financial Resource Strain: Not on file  ?Food Insecurity: Not on file  ?Transportation Needs: Not on file  ?Physical Activity: Not on file  ?Stress: Not on file  ?Social Connections: Not on file  ? ?Additional Social History:  ?  ?  ?  ?  ?  ?  ?  ?  ?  ?  ?  ? ?Sleep: Fair ? ?Appetite:  Poor ? ?Current Medications: ?Current Facility-Administered Medications  ?Medication Dose Route Frequency Provider Last Rate Last Admin  ? acetaminophen (TYLENOL) tablet 650 mg  650 mg Oral Q6H PRN [name], [name], MD      ? alum & mag hydroxide-simeth (MAALOX/MYLANTA) 200-200-20 MG/5ML suspension 30 mL  30 mL Oral Q4H PRN [name], [name], MD   30 mL at 03/26/22 2035  ? aspirin EC tablet 81 mg  81 mg Oral Daily [name], [name], MD   81 mg at 04/01/22 0930  ? atorvastatin (LIPITOR) tablet 20 mg  20 mg Oral Daily [name], [name], MD   20 mg at 04/01/22 0930  ? atropine 1 % ophthalmic solution 1 drop  1 drop Sublingual QID [name], MD   1 drop at 04/01/22 0930  ? benztropine (COGENTIN) tablet 0.5 mg  0.5 mg Oral BID  [name], [name], MD   0.5 mg at 04/01/22 0930  ? clonazePAM (KLONOPIN) tablet 1 mg  1 mg Oral TID [name], [name], MD   1 mg at 04/01/22 0930  ? cloZAPine (CLOZARIL) tablet 300 mg  300 mg Oral QHS [name], [name], MD   300 mg at 03/30/22 2114  ? diphenhydrAMINE (BENADRYL) capsule 50 mg  50 mg Oral Q6H PRN [name], [name], MD   50 mg at 03/26/22 2035  ? Or  ? diphenhydrAMINE (BENADRYL) injection 50 mg  50 mg Intramuscular Q6H PRN [name], [name], MD      ? gabapentin (NEURONTIN) capsule 300 mg  300 mg Oral TID [name], [name], MD   300 mg at 04/01/22 0930  ? haloperidol (HALDOL) tablet 5 mg  5 mg Oral TID [name], [name], MD   5 mg at 04/01/22 0930  ? haloperidol (HALDOL) tablet 5 mg  5 mg Oral Q6H PRN [name], [name], MD   5 mg at 03/22/22 1421  ? Or  ? haloperidol lactate (HALDOL) injection 5 mg  5 mg Intramuscular Q6H PRN [name], [name], MD      ? magnesium hydroxide (MILK OF MAGNESIA) suspension 30 mL  30 mL Oral Daily PRN [name], [name], MD      ? metoprolol succinate (TOPROL-XL) 24 hr tablet 25 mg  25 mg Oral Daily [name], [name], MD   25 mg at 04/01/22 0930  ? paliperidone (INVEGA SUSTENNA) injection 156 mg  156 mg Intramuscular Q28 days [name], [name], MD   156 mg at 03/29/22 1445  ? sulfamethoxazole-trimethoprim (BACTRIM DS) 800-160 MG per tablet 1 tablet  1 tablet Oral Q12H [name], MD   1 tablet at 04/01/22 1033  ? temazepam (RESTORIL) capsule 15 mg  15 mg Oral QHS [name], [name], MD   15 mg at 03/30/22 2114  ? ziprasidone (GEODON) injection 20 mg  20 mg Intramuscular Q12H PRN [name], [name], MD   20 mg at 03/19/22 1551  ? ? ?Lab Results:  ?Results for orders placed or performed during the hospital encounter of 03/19/22 (from the past 48 hour(s))  ?Urinalysis, Routine w reflex microscopic Urine, Clean Catch     Status: Abnormal  ? Collection Time: 03/31/22  5:23 PM  ?Result Value Ref Range  ? Color, Urine YELLOW (A) YELLOW  ? APPearance CLOUDY (A) CLEAR  ? Specific Gravity, Urine 1.014 1.005 - 1.030  ? pH 7.0 5.0 - 8.0  ? Glucose, UA NEGATIVE NEGATIVE mg/dL  ? Hgb urine dipstick MODERATE (A) NEGATIVE  ? Bilirubin Urine NEGATIVE NEGATIVE  ? Ketones, ur NEGATIVE NEGATIVE mg/dL  ? Protein, ur 30 (A) NEGATIVE mg/dL  ? Nitrite POSITIVE (A) NEGATIVE  ? Leukocytes,Ua LARGE (A) NEGATIVE  ? RBC / HPF >50 (H) 0 - 5 RBC/hpf  ? WBC, UA >50 (H) 0 - 5 WBC/hpf  ? Bacteria, UA MANY (A) NONE SEEN  ? Squamous Epithelial / LPF NONE SEEN 0 - 5  ? WBC Clumps PRESENT   ? Mucus PRESENT   ?  Comment: Performed at [facility], [address] [place], [place] [address]  ? ? ?Blood Alcohol level:  ?Lab Results  ?Component Value Date  ? ETH <10 07/20/2021  ? ? ?Metabolic Disorder Labs: ?No results found for: HGBA1C,  MPG ?No results found for: PROLACTIN ?Lab Results  ?Component Value Date  ? CHOL 167 11/20/2021  ? TRIG 107 11/20/2021  ? HDL 26 (  L) 11/20/2021  ? CHOLHDL 6.4 11/20/2021  ? VLDL 21 11/20/2021  ? LDLCALC 120 (H) 11/20/2021  ? ? ?Physical Findings: ?AIMS:  , ,  ,  ,    ?CIWA:    ?COWS:    ? ?Musculoskeletal: ?Strength & Muscle Tone: within normal limits ?Gait & Station: normal ?Patient leans: N/A ? ?Psychiatric Specialty Exam: ? ?Presentation  ?General Appearance: Disheveled ? ?Eye Contact:None ? ?Speech:Clear and Coherent ? ?Speech Volume:Increased ? ?Handedness:Right ? ? ?Mood and Affect  ?Mood:Anxious; Irritable ? ?Affect:Congruent ? ? ?Thought Process  ?Thought Processes:Disorganized ? ?Descriptions of Associations:Loose ? ?Orientation:Partial ? ?Thought Content:Scattered; Illogical ? ?History of Schizophrenia/Schizoaffective disorder:Yes ? ?Duration of Psychotic Symptoms:Greater than six months ? ?Hallucinations:No data recorded ?Ideas of Reference:-- (UTA) ? ?Suicidal Thoughts:No data recorded ?Homicidal Thoughts:No data recorded ? ?Sensorium  ?Memory:Remote Poor; Recent Poor; Immediate Poor; Other (comment) (impaired memory and cognition) ? ?Judgment:Impaired ? ?Insight:Lacking ? ? ?Executive Functions  ?Concentration:Poor ? ?Attention Span:Poor ? ?Recall:Poor ? ?Fund of Knowledge:Poor ? ?Language:Poor ? ? ?Psychomotor Activity  ?Psychomotor Activity:No data recorded ? ?Assets  ?Assets:Housing; Resilience; Social Support; Talents/Skills ? ? ?Sleep  ?Sleep:No data recorded ? ? ?Physical Exam: ?Physical Exam ?ROS ?Blood pressure 109/90, pulse (!) 107, temperature 98.7 ?F (37.1 ?C), temperature source Oral, resp. rate 17, height 5\' 8"  (1.727 m), weight 85.8 kg, SpO2 97 %. Body mass index is 28.76 kg/m?. ? ? ?Treatment Plan Summary: ?Daily contact with patient to assess and evaluate symptoms and progress in treatment and Medication management ?Plan no change to current medication.  Supportive counseling and  encouragement.  ?UA showed signs of  UTI that can explain increased agitation. Bactrim restarted. No other changes in medicines today. ? ?-continue scheduled medications: ? aspirin EC  81 mg Oral Daily  ? atorvas

## 2022-04-01 NOTE — Progress Notes (Signed)
Patient continues to be on 1:1 for safety no distress noted 15 minutes safety checks maintained will continue to monitor closely.  ?

## 2022-04-02 DIAGNOSIS — R4689 Other symptoms and signs involving appearance and behavior: Secondary | ICD-10-CM | POA: Diagnosis not present

## 2022-04-02 DIAGNOSIS — S069X0S Unspecified intracranial injury without loss of consciousness, sequela: Secondary | ICD-10-CM | POA: Diagnosis not present

## 2022-04-02 NOTE — Plan of Care (Signed)
?  Problem: Education: Goal: Ability to state activities that reduce stress will improve Outcome: Progressing   Problem: Self-Concept: Goal: Ability to identify factors that promote anxiety will improve Outcome: Progressing Goal: Ability to modify response to factors that promote anxiety will improve Outcome: Progressing   

## 2022-04-02 NOTE — Progress Notes (Signed)
1:1 Patient hourly rounding ? ?0800: patient is in bed, sleeping. Assigned sitter is present, with patient.  ? ?0900: patient is in bed, sleeping. Assigned sitter is present, with patient. ? ?1000: patient is in bed, sleeping. Assigned sitter is present, with patient. ? ?1100: patient is awake in dayroom. Patient ate breakfast. Patient compliant with medication administration. Assigned sitter is present with patient. ? ?1200: Patient is in bedroom. Assigned sitter is with the patient.  ? ?1300: Patient is in the courtyard. Assigned sitter is present with patient. ? ?1400: Patient outside in the courtyard. Assigned sitter is present with patient. ? ?1500: Patient in dayroom watching television. Assigned sitter is present with patient. ? ?1600: Patient is in dayroom. Assigned sitter is present with patient. ? ?1700: Patient is in bedroom. Assigned sitter is present with patient.  ? ?1800: Patient is in bedroom. Assigned sitter is present with patient. ? ?1900: Patient is in dayroom. Assigned sitter is present with patient. ?

## 2022-04-02 NOTE — Progress Notes (Signed)
SLP Cancellation Note ? ?Patient Details ?Name: Adam [name] ?MRN: [id number] ?DOB: 05/01/1961 ? ? ?Cancelled treatment:       Reason Eval/Treat Not Completed: Patient declined, no reason specified. Attempted visit. Pt verbally agitated prior to SLP entering room, sitter reports pt complaining about medication taste. This therapist engaged pt briefly and re-introduced self (SLP familiar with this patient from another facility). Pt vocalizing, when SLP asked if pt was upset, he stated "Yes!" And when asked if he would like to be left alone, pt stated "Yes," calmly. Will continue efforts. ? ?Rondel Baton, MS, CCC-SLP ?Speech-Language Pathologist ?[phone] ? ? ?Adam [name] ?04/02/2022, 11:58 AM ? ? ?

## 2022-04-02 NOTE — Progress Notes (Signed)
[facility] MD Progress Note ? ?04/02/2022 11:23 AM ?[name]  ?MRN:  [id number] ?Subjective: Adam [name] is seen on rounds today.  He is with his one-to-one.  He says that he needs a shirt and shoes.  He seems to be pleasant but confused.  He has been compliant with his medications and there is no report of him being aggressive or uncooperative. ? ?Principal Problem: Difficulty controlling behavior as late effect of traumatic brain injury (HCC) ?Diagnosis: Principal Problem: ?  Difficulty controlling behavior as late effect of traumatic brain injury (HCC) ?Active Problems: ?  Schizophrenia, chronic condition with acute exacerbation (HCC) ?  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 ?Total Time spent with patient: 15 minutes ? ?Past Psychiatric History: Extensive ? ?Past Medical History:  ?Past Medical History:  ?Diagnosis Date  ? Myocardial infarction [facility])   ? Schizophrenia (HCC)   ? Stroke [facility])   ? TBI (traumatic brain injury) (HCC)   ? History reviewed. No pertinent surgical history. ?Family History: History reviewed. No pertinent family history. ? ?Social History:  ?Social History  ? ?Substance and Sexual Activity  ?Alcohol Use Not Currently  ?   ?Social History  ? ?Substance and Sexual Activity  ?Drug Use Not Currently  ?  ?Social History  ? ?Socioeconomic History  ? Marital status: Single  ?  Spouse name: Not on file  ? Number of children: Not on file  ? Years of education: Not on file  ? Highest education level: Not on file  ?Occupational History  ? Not on file  ?Tobacco Use  ? Smoking status: Never  ?  Passive exposure: Never  ? Smokeless tobacco: Never  ?Vaping Use  ? Vaping Use: Unknown  ?Substance and Sexual Activity  ? Alcohol use: Not Currently  ? Drug use: Not Currently  ? Sexual activity: Not Currently  ?Other Topics Concern  ? Not on file  ?Social History Narrative  ? Not on file  ? ?Social Determinants of Health  ? ?Financial Resource Strain: Not on file  ?Food Insecurity: Not on  file  ?Transportation Needs: Not on file  ?Physical Activity: Not on file  ?Stress: Not on file  ?Social Connections: Not on file  ? ?Additional Social History:  ?  ?  ?  ?  ?  ?  ?  ?  ?  ?  ?  ? ?Sleep: Good ? ?Appetite:  Good ? ?Current Medications: ?Current Facility-Administered Medications  ?Medication Dose Route Frequency Provider Last Rate Last Admin  ? acetaminophen (TYLENOL) tablet 650 mg  650 mg Oral Q6H PRN [name], [name], MD      ? alum & mag hydroxide-simeth (MAALOX/MYLANTA) 200-200-20 MG/5ML suspension 30 mL  30 mL Oral Q4H PRN [name], [name], MD   30 mL at 03/26/22 2035  ? aspirin EC tablet 81 mg  81 mg Oral Daily [name], [name], MD   81 mg at 04/02/22 1052  ? atorvastatin (LIPITOR) tablet 20 mg  20 mg Oral Daily [name], [name], MD   20 mg at 04/02/22 1052  ? atropine 1 % ophthalmic solution 1 drop  1 drop Sublingual QID [name], MD   1 drop at 04/02/22 1054  ? benztropine (COGENTIN) tablet 0.5 mg  0.5 mg Oral BID [name], [name], MD   0.5 mg at 04/02/22 1053  ? clonazePAM (KLONOPIN) tablet 1 mg  1 mg Oral TID [name], [name], MD   1 mg at 04/02/22 1052  ? cloZAPine (CLOZARIL)  tablet 300 mg  300 mg Oral QHS [name], [name], MD   300 mg at 03/30/22 2114  ? diphenhydrAMINE (BENADRYL) capsule 50 mg  50 mg Oral Q6H PRN [name], [name], MD   50 mg at 03/26/22 2035  ? Or  ? diphenhydrAMINE (BENADRYL) injection 50 mg  50 mg Intramuscular Q6H PRN [name], [name], MD      ? gabapentin (NEURONTIN) capsule 300 mg  300 mg Oral TID [name], [name], MD   300 mg at 04/02/22 1052  ? haloperidol (HALDOL) tablet 5 mg  5 mg Oral TID [name], [name], MD   5 mg at 04/02/22 1052  ? haloperidol (HALDOL) tablet 5 mg  5 mg Oral Q6H PRN [name], [name], MD   5 mg at 03/22/22 1421  ? Or  ? haloperidol lactate (HALDOL) injection 5 mg  5 mg Intramuscular Q6H PRN [name], [name], MD      ? magnesium hydroxide (MILK OF MAGNESIA) suspension 30 mL  30 mL Oral Daily PRN [name], [name], MD      ? metoprolol succinate  (TOPROL-XL) 24 hr tablet 25 mg  25 mg Oral Daily [name], [name], MD   25 mg at 04/02/22 1052  ? paliperidone (INVEGA SUSTENNA) injection 156 mg  156 mg Intramuscular Q28 days [name], [name], MD   156 mg at 03/29/22 1445  ? sulfamethoxazole-trimethoprim (BACTRIM DS) 800-160 MG per tablet 1 tablet  1 tablet Oral Q12H [name], MD   1 tablet at 04/02/22 1052  ? temazepam (RESTORIL) capsule 15 mg  15 mg Oral QHS [name], [name], MD   15 mg at 03/30/22 2114  ? ziprasidone (GEODON) injection 20 mg  20 mg Intramuscular Q12H PRN [name], [name], MD   20 mg at 03/19/22 1551  ? ? ?Lab Results:  ?Results for orders placed or performed during the hospital encounter of 03/19/22 (from the past 48 hour(s))  ?Urinalysis, Routine w reflex microscopic Urine, Clean Catch     Status: Abnormal  ? Collection Time: 03/31/22  5:23 PM  ?Result Value Ref Range  ? Color, Urine YELLOW (A) YELLOW  ? APPearance CLOUDY (A) CLEAR  ? Specific Gravity, Urine 1.014 1.005 - 1.030  ? pH 7.0 5.0 - 8.0  ? Glucose, UA NEGATIVE NEGATIVE mg/dL  ? Hgb urine dipstick MODERATE (A) NEGATIVE  ? Bilirubin Urine NEGATIVE NEGATIVE  ? Ketones, ur NEGATIVE NEGATIVE mg/dL  ? Protein, ur 30 (A) NEGATIVE mg/dL  ? Nitrite POSITIVE (A) NEGATIVE  ? Leukocytes,Ua LARGE (A) NEGATIVE  ? RBC / HPF >50 (H) 0 - 5 RBC/hpf  ? WBC, UA >50 (H) 0 - 5 WBC/hpf  ? Bacteria, UA MANY (A) NONE SEEN  ? Squamous Epithelial / LPF NONE SEEN 0 - 5  ? WBC Clumps PRESENT   ? Mucus PRESENT   ?  Comment: Performed at [facility], [address], [place], [place] [address]  ? ? ?Blood Alcohol level:  ?Lab Results  ?Component Value Date  ? ETH <10 07/20/2021  ? ? ?Metabolic Disorder Labs: ?No results found for: HGBA1C, MPG ?No results found for: PROLACTIN ?Lab Results  ?Component Value Date  ? CHOL 167 11/20/2021  ? TRIG 107 11/20/2021  ? HDL 26 (L) 11/20/2021  ? CHOLHDL 6.4 11/20/2021  ? VLDL 21 11/20/2021  ? LDLCALC 120 (H) 11/20/2021  ? ? ?Physical Findings: ?AIMS:  , ,  ,  ,     ?CIWA:    ?  COWS:    ? ?Musculoskeletal: ?Strength & Muscle Tone: within normal limits ?Gait & Station: unsteady ?Patient leans: N/A ? ?Psychiatric Specialty Exam: ? ?Presentation  ?General Appearance: Disheveled ? ?Eye Contact:None ? ?Speech:Clear and Coherent ? ?Speech Volume:Increased ? ?Handedness:Right ? ? ?Mood and Affect  ?Mood:Anxious; Irritable ? ?Affect:Congruent ? ? ?Thought Process  ?Thought Processes:Disorganized ? ?Descriptions of Associations:Loose ? ?Orientation:Partial ? ?Thought Content:Scattered; Illogical ? ?History of Schizophrenia/Schizoaffective disorder:Yes ? ?Duration of Psychotic Symptoms:Greater than six months ? ?Hallucinations:No data recorded ?Ideas of Reference:-- (UTA) ? ?Suicidal Thoughts:No data recorded ?Homicidal Thoughts:No data recorded ? ?Sensorium  ?Memory:Remote Poor; Recent Poor; Immediate Poor; Other (comment) (impaired memory and cognition) ? ?Judgment:Impaired ? ?Insight:Lacking ? ? ?Executive Functions  ?Concentration:Poor ? ?Attention Span:Poor ? ?Recall:Poor ? ?Fund of Knowledge:Poor ? ?Language:Poor ? ? ?Psychomotor Activity  ?Psychomotor Activity:No data recorded ? ?Assets  ?Assets:Housing; Resilience; Social Support; Talents/Skills ? ? ?Sleep  ?Sleep:No data recorded ? ? ?Physical Exam: ?Physical Exam ?Vitals and nursing note reviewed.  ?Constitutional:   ?   Appearance: Normal appearance. He is normal weight.  ?Neurological:  ?   General: No focal deficit present.  ?   Mental Status: He is alert and oriented to person, place, and time.  ?Psychiatric:     ?   Attention and Perception: Perception normal. He is inattentive.     ?   Mood and Affect: Mood normal. Affect is labile.     ?   Speech: Speech is delayed, slurred and tangential.     ?   Behavior: Behavior is slowed.     ?   Thought Content: Thought content is paranoid.     ?   Cognition and Memory: Cognition is impaired. Memory is impaired.     ?   Judgment: Judgment is impulsive and inappropriate.  ? ?Review  of Systems  ?Constitutional: Negative.   ?HENT: Negative.    ?Eyes: Negative.   ?Respiratory: Negative.    ?Cardiovascular: Negative.   ?Gastrointestinal: Negative.   ?Genitourinary: Negative.   ?Musculoskeletal

## 2022-04-02 NOTE — Progress Notes (Signed)
This Clinical research associate took the patients snack and medications down to him, Pt was asleep. Medication held tonight. Pt has 1:1 with a sitter present. Pt remains safe on the unit.  ?

## 2022-04-02 NOTE — Progress Notes (Signed)
Patient on 1:1 with a sitter present for safety. Pt compliant with medication administration per MD orders. Pt given support. Pt remains safe on the unit.  ?

## 2022-04-02 NOTE — TOC Progression Note (Signed)
Transition of Care (TOC) - Progression Note  ? ? ?Patient Details  ?Name: Adam [name] ?MRN: [id number] ?Date of Birth: 04/01/1961 ? ?Transition of Care (TOC) CM/SW Contact  ?Hazelton Cellar, RN ?Phone Number: ?04/02/2022, 4:49 PM ? ?Clinical Narrative:    ?Faxed all requested information to [employer] attention Toby.  ? ? ?  ?  ? ?Expected Discharge Plan and Services ?  ?  ?  ?  ?  ?                ?  ?  ?  ?  ?  ?  ?  ?  ?  ?  ? ? ?Social Determinants of Health (SDOH) Interventions ?  ? ?Readmission Risk Interventions ?   ? View : No data to display.  ?  ?  ?  ? ? ?

## 2022-04-02 NOTE — Progress Notes (Signed)
Patient on 1:1 for safety with a sitter present. Pt is asleep and without complaint at this time. Pt remains safe on the unit.  

## 2022-04-02 NOTE — Progress Notes (Signed)
Pt on 1:1 with a sitter present. Pt asleep with a sitter present. Pt remains safe on the unit.  ?

## 2022-04-03 DIAGNOSIS — R4689 Other symptoms and signs involving appearance and behavior: Secondary | ICD-10-CM | POA: Diagnosis not present

## 2022-04-03 DIAGNOSIS — S069X0S Unspecified intracranial injury without loss of consciousness, sequela: Secondary | ICD-10-CM | POA: Diagnosis not present

## 2022-04-03 NOTE — Progress Notes (Signed)
Patient is awake and in the restroom. Pt is without complaint at this time ?

## 2022-04-03 NOTE — Progress Notes (Signed)
[facility] MD Progress Note ? ?04/03/2022 4:19 PM ?Adam [name]  ?MRN:  [id number] ?Subjective: Patient seen and chart reviewed.  No new complaint.  Comes out of his room at times and is not causing much trouble among other people.  No evidence acutely dangerous behavior. ?Principal Problem: Difficulty controlling behavior as late effect of traumatic brain injury (HCC) ?Diagnosis: Principal Problem: ?  Difficulty controlling behavior as late effect of traumatic brain injury (HCC) ?Active Problems: ?  Schizophrenia, chronic condition with acute exacerbation (HCC) ?  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 ?Total Time spent with patient: 20 minutes ? ?Past Psychiatric History: See previous ? ?Past Medical History:  ?Past Medical History:  ?Diagnosis Date  ? Myocardial infarction [facility])   ? Schizophrenia (HCC)   ? Stroke [facility])   ? TBI (traumatic brain injury) (HCC)   ? History reviewed. No pertinent surgical history. ?Family History: History reviewed. No pertinent family history. ?Family Psychiatric  History: See previous ?Social History:  ?Social History  ? ?Substance and Sexual Activity  ?Alcohol Use Not Currently  ?   ?Social History  ? ?Substance and Sexual Activity  ?Drug Use Not Currently  ?  ?Social History  ? ?Socioeconomic History  ? Marital status: Single  ?  Spouse name: Not on file  ? Number of children: Not on file  ? Years of education: Not on file  ? Highest education level: Not on file  ?Occupational History  ? Not on file  ?Tobacco Use  ? Smoking status: Never  ?  Passive exposure: Never  ? Smokeless tobacco: Never  ?Vaping Use  ? Vaping Use: Unknown  ?Substance and Sexual Activity  ? Alcohol use: Not Currently  ? Drug use: Not Currently  ? Sexual activity: Not Currently  ?Other Topics Concern  ? Not on file  ?Social History Narrative  ? Not on file  ? ?Social Determinants of Health  ? ?Financial Resource Strain: Not on file  ?Food Insecurity: Not on file  ?Transportation Needs:  Not on file  ?Physical Activity: Not on file  ?Stress: Not on file  ?Social Connections: Not on file  ? ?Additional Social History:  ?  ?  ?  ?  ?  ?  ?  ?  ?  ?  ?  ? ?Sleep: Fair ? ?Appetite:  Fair ? ?Current Medications: ?Current Facility-Administered Medications  ?Medication Dose Route Frequency Provider Last Rate Last Admin  ? acetaminophen (TYLENOL) tablet 650 mg  650 mg Oral Q6H PRN Enma Maeda T, MD      ? alum & mag hydroxide-simeth (MAALOX/MYLANTA) 200-200-20 MG/5ML suspension 30 mL  30 mL Oral Q4H PRN Barbara Ahart, [name], MD   30 mL at 03/26/22 2035  ? aspirin EC tablet 81 mg  81 mg Oral Daily Derick Seminara, [name], MD   81 mg at 04/03/22 3235  ? atorvastatin (LIPITOR) tablet 20 mg  20 mg Oral Daily Rogan Wigley T, MD   20 mg at 04/03/22 5732  ? atropine 1 % ophthalmic solution 1 drop  1 drop Sublingual QID [name], MD   1 drop at 04/03/22 1202  ? benztropine (COGENTIN) tablet 0.5 mg  0.5 mg Oral BID Kenae Lindquist, [name], MD   0.5 mg at 04/03/22 2025  ? clonazePAM (KLONOPIN) tablet 1 mg  1 mg Oral TID Amparo Donalson, [name], MD   1 mg at 04/03/22 1201  ? cloZAPine (CLOZARIL) tablet 300 mg  300 mg Oral QHS Joey Lierman, [name], MD  300 mg at 04/02/22 2114  ? diphenhydrAMINE (BENADRYL) capsule 50 mg  50 mg Oral Q6H PRN Desteni Piscopo, [name], MD   50 mg at 03/26/22 2035  ? Or  ? diphenhydrAMINE (BENADRYL) injection 50 mg  50 mg Intramuscular Q6H PRN Wanya Bangura T, MD      ? gabapentin (NEURONTIN) capsule 300 mg  300 mg Oral TID Neziah Braley T, MD   300 mg at 04/03/22 1201  ? haloperidol (HALDOL) tablet 5 mg  5 mg Oral TID Javarious Elsayed, [name], MD   5 mg at 04/03/22 1202  ? haloperidol (HALDOL) tablet 5 mg  5 mg Oral Q6H PRN Keishon Chavarin, [name], MD   5 mg at 03/22/22 1421  ? Or  ? haloperidol lactate (HALDOL) injection 5 mg  5 mg Intramuscular Q6H PRN Rosaelena Kemnitz T, MD      ? magnesium hydroxide (MILK OF MAGNESIA) suspension 30 mL  30 mL Oral Daily PRN Novella Abraha T, MD      ? metoprolol succinate (TOPROL-XL) 24 hr tablet 25 mg  25 mg  Oral Daily Renai Lopata, [name], MD   25 mg at 04/03/22 3536  ? paliperidone (INVEGA SUSTENNA) injection 156 mg  156 mg Intramuscular Q28 days Kristel Durkee, [name], MD   156 mg at 03/29/22 1445  ? sulfamethoxazole-trimethoprim (BACTRIM DS) 800-160 MG per tablet 1 tablet  1 tablet Oral Q12H [name], MD   1 tablet at 04/03/22 0834  ? temazepam (RESTORIL) capsule 15 mg  15 mg Oral QHS Brennan Karam T, MD   15 mg at 04/02/22 2114  ? ziprasidone (GEODON) injection 20 mg  20 mg Intramuscular Q12H PRN Rutherford Alarie, [name], MD   20 mg at 03/19/22 1551  ? ? ?Lab Results: No results found for this or any previous visit (from the past 48 hour(s)). ? ?Blood Alcohol level:  ?Lab Results  ?Component Value Date  ? ETH <10 07/20/2021  ? ? ?Metabolic Disorder Labs: ?No results found for: HGBA1C, MPG ?No results found for: PROLACTIN ?Lab Results  ?Component Value Date  ? CHOL 167 11/20/2021  ? TRIG 107 11/20/2021  ? HDL 26 (L) 11/20/2021  ? CHOLHDL 6.4 11/20/2021  ? VLDL 21 11/20/2021  ? LDLCALC 120 (H) 11/20/2021  ? ? ?Physical Findings: ?AIMS:  , ,  ,  ,    ?CIWA:    ?COWS:    ? ?Musculoskeletal: ?Strength & Muscle Tone: within normal limits ?Gait & Station: normal ?Patient leans: N/A ? ?Psychiatric Specialty Exam: ? ?Presentation  ?General Appearance: Disheveled ? ?Eye Contact:None ? ?Speech:Clear and Coherent ? ?Speech Volume:Increased ? ?Handedness:Right ? ? ?Mood and Affect  ?Mood:Anxious; Irritable ? ?Affect:Congruent ? ? ?Thought Process  ?Thought Processes:Disorganized ? ?Descriptions of Associations:Loose ? ?Orientation:Partial ? ?Thought Content:Scattered; Illogical ? ?History of Schizophrenia/Schizoaffective disorder:Yes ? ?Duration of Psychotic Symptoms:Greater than six months ? ?Hallucinations:No data recorded ?Ideas of Reference:-- (UTA) ? ?Suicidal Thoughts:No data recorded ?Homicidal Thoughts:No data recorded ? ?Sensorium  ?Memory:Remote Poor; Recent Poor; Immediate Poor; Other (comment) (impaired memory and  cognition) ? ?Judgment:Impaired ? ?Insight:Lacking ? ? ?Executive Functions  ?Concentration:Poor ? ?Attention Span:Poor ? ?Recall:Poor ? ?Fund of Knowledge:Poor ? ?Language:Poor ? ? ?Psychomotor Activity  ?Psychomotor Activity:No data recorded ? ?Assets  ?Assets:Housing; Resilience; Social Support; Talents/Skills ? ? ?Sleep  ?Sleep:No data recorded ? ? ?Physical Exam: ?Physical Exam ?Vitals and nursing note reviewed.  ?Constitutional:   ?   Appearance: Normal appearance.  ?HENT:  ?   Head: Normocephalic and atraumatic.  ?  Mouth/Throat:  ?   Pharynx: Oropharynx is clear.  ?Eyes:  ?   Pupils: Pupils are equal, round, and reactive to light.  ?Cardiovascular:  ?   Rate and Rhythm: Normal rate and regular rhythm.  ?Pulmonary:  ?   Effort: Pulmonary effort is normal.  ?   Breath sounds: Normal breath sounds.  ?Abdominal:  ?   General: Abdomen is flat.  ?   Palpations: Abdomen is soft.  ?Musculoskeletal:     ?   General: Normal range of motion.  ?Skin: ?   General: Skin is warm and dry.  ?Neurological:  ?   General: No focal deficit present.  ?   Mental Status: He is alert. Mental status is at baseline.  ?Psychiatric:     ?   Mood and Affect: Mood normal. Affect is inappropriate.     ?   Speech: He is noncommunicative.  ? ?ROS ?Blood pressure 120/76, pulse (!) 107, temperature 97.8 ?F (36.6 ?C), temperature source Oral, resp. rate 18, height 5\' 8"  (1.727 m), weight 85.8 kg, SpO2 100 %. Body mass index is 28.76 kg/m?. ? ? ?Treatment Plan Summary: ?Plan no change to medication.  Continue to await placement ? ? , MD ?04/03/2022, 4:19 PM ? ?

## 2022-04-03 NOTE — Progress Notes (Signed)
Patient spit his medication back out into his water cup, and then attempted to drink the water and pills at the same time. This writer poured some grape juice into his pill cup, and he drank what was left of his dissolving pills. This Probation officer will notify MD. ?

## 2022-04-03 NOTE — Progress Notes (Signed)
This Clinical research associate went and looked in patient's room for his bra and it was not located. This Clinical research associate will check with other staff members on the unit. ?

## 2022-04-03 NOTE — Progress Notes (Signed)
Patient was knocking on the double doors and when this writer went to see what patient wanted, he was pointing straight ahead. This Clinical research associate asked patient if he wanted to go and get somehting to drink, he stated no, and proceeded to walk off of the back hall. Patient then starts to walk, with his assigned safety sitter present. This Clinical research associate asked patient was he going to walk around the hallways, and he said "yes". Patient is now sitting in the dayroom, with his assigned safety sitter, without any issues thus far. Patient remains safe on the unit. ?

## 2022-04-03 NOTE — Progress Notes (Signed)
Patient received his QHS meds without incident. Denies si hi avh depression and anxiety at this encounter. (He answers no to all questions asked.) Continues to be with 1:1 sitter.  Will continue to monitor with q 15 minute safety checks. ? ? ? C Butler-Nicholson, LPN ? ? ? ? ? ? ?

## 2022-04-03 NOTE — Progress Notes (Signed)
Patient is in the dayroom drinking coffee with 1:1 sitter. Patient appears to be in a good mood as he smiles upon my approach. Will continue to monitor for changes in mood or behavior. Will continue with 1:1 sitter and 15 minute safety rounds. ? ? ? ? ?C Butler-Nicholson, LPn ?

## 2022-04-03 NOTE — Progress Notes (Signed)
Patient was motioning to this Clinical research associate that he needs his bra. This Clinical research associate asks patient was it in his room, and he stated "no". This Clinical research associate told patient that I would go and look in the washing machine. This Clinical research associate did not find patient's bra in the laundry room, so this writer will go and double check patient's room. ?

## 2022-04-03 NOTE — Plan of Care (Signed)
?  Problem: Safety: ?Goal: Violent Restraint(s) ?Outcome: Progressing ?  ?Problem: Education: ?Goal: Ability to state activities that reduce stress will improve ?Outcome: Progressing ?  ?Problem: Coping: ?Goal: Ability to identify and develop effective coping behavior will improve ?Outcome: Progressing ?  ?Problem: Self-Concept: ?Goal: Ability to identify factors that promote anxiety will improve ?Outcome: Progressing ?Goal: Level of anxiety will decrease ?Outcome: Progressing ?Goal: Ability to modify response to factors that promote anxiety will improve ?Outcome: Progressing ?  ?Problem: Self-Concept: ?Goal: Level of anxiety will decrease ?Outcome: Progressing ?  ?

## 2022-04-03 NOTE — Plan of Care (Signed)
D- Patient alert and oriented to person and situation. Patient presented in a pleasant mood on assessment stating that he slept good last night and had no complaints to voice to this Clinical research associate. When asked patient about SI/HI, AVH, and pain, patient's response was "no". Patient also denies any signs/symptoms of depression/anxiety. Patient had no stated goals for today. ? ?A- Scheduled medications administered to patient, per MD orders. Support and encouragement provided.  Routine safety checks conducted every 15 minutes.  Patient informed to notify staff with problems or concerns. ? ?R- No adverse drug reactions noted. Patient contracts for safety at this time. Patient compliant with medications and treatment plan. Patient receptive, calm, and cooperative. Patient interacts well with others on the unit. Patient remains safe at this time. ? ?Problem: Safety: ?Goal: Violent Restraint(s) ?Outcome: Progressing ?  ?Problem: Education: ?Goal: Ability to state activities that reduce stress will improve ?Outcome: Progressing ?  ?Problem: Coping: ?Goal: Ability to identify and develop effective coping behavior will improve ?Outcome: Progressing ?  ?Problem: Self-Concept: ?Goal: Ability to identify factors that promote anxiety will improve ?Outcome: Progressing ?Goal: Level of anxiety will decrease ?Outcome: Progressing ?Goal: Ability to modify response to factors that promote anxiety will improve ?Outcome: Progressing ?  ?

## 2022-04-03 NOTE — Progress Notes (Signed)
Patient on 1:1 with a sitter present. Pt is asleep and without concern. Pt remains safe on the unit  ?

## 2022-04-03 NOTE — Progress Notes (Signed)
Recreation Therapy Notes ? ? ?Date: 04/03/2022 ? ?Time: 10:35 am  ? ?Location: Courtyard   ? ?Behavioral response: Isolated, Not Engaged.  ? ?Intervention Topic: Leisure   ? ?Discussion/Intervention:  ?Group content today was focused on leisure. The group defined what leisure is and some positive leisure activities they participate in. Individuals identified the difference between good and bad leisure. Participants expressed how they feel after participating in the leisure of their choice. The group discussed how they go about picking a leisure activity and if others are involved in their leisure activities. The patient stated how many leisure activities they have to choose from and reasons why it is important to have leisure time. Individuals participated in the intervention ?Exploration of Leisure? where they had a chance to identify new leisure activities as well as benefits of leisure. ?Clinical Observations/Feedback: ?Patient came to group and sat alone. Participant took a patients hat and returned to his room.  ?Adam [name] LRT/CTRS  ? ? ? ? ? ? ? ? ? ? ?Adam [name] ?04/03/2022 1:07 PM ?

## 2022-04-03 NOTE — Progress Notes (Signed)
1:1 Patient Hourly Rounding ? ?0800: Patient is in bed, asleep, with his assigned safety sitter present. ? ?0900: Patient is in the dayroom, with his assigned safety sitter present. ? ?1000: Patient is in his room, with his assigned safety sitter present. ? ?1100: Patient is outside, in the courtyard with staff, other members on the unit, and his assigned safety sitter present. ? ?1200: Patient is in the dayroom, eating lunch, with his assigned safety sitter present. ? ?1300: Patient is in the dayroom, calm and composed, with his assigned safety sitter present. ? ?1400: Patient is in his room, awake, with his assigned safety sitter present. ? ?1500: Patient is in his room, laying on the bed, with his assigned safety sitter present. ? ?1600: Patient is in his room, calm and composed, with his assigned safety sitter present. ? ?1700: Patient is in the dayroom, with his assigned safety sitter present. ? ?1800: Patient is in bed, awake, with his assigned safety sitter present. ? ?1900: Patient is in the dayroom, watching television, with other members on the unit as well as his assigned safety sitter present.  ? ?

## 2022-04-04 DIAGNOSIS — R4689 Other symptoms and signs involving appearance and behavior: Secondary | ICD-10-CM | POA: Diagnosis not present

## 2022-04-04 DIAGNOSIS — S069X0S Unspecified intracranial injury without loss of consciousness, sequela: Secondary | ICD-10-CM | POA: Diagnosis not present

## 2022-04-04 NOTE — Progress Notes (Signed)
Assisted with shaving, as he requested to shave. Shouting "razor"  "razor"  "razor" He was calm and cooperative during the process. Patient showered and washed hair after getting shaved. Remains with 1:1 sitter at this time. Will continue to monitor with q15 minute safety checks. ? ? ?C Butler-Nicholson, LPN ?

## 2022-04-04 NOTE — Progress Notes (Signed)
Recreation Therapy Notes ? ?Date: 04/04/2022 ? ?Time: 10:40 am   ? ?Location: Courtyard ? ?Behavioral response: Appropriate ? ?Intervention Topic: Communication    ? ?Discussion/Intervention:  ?Group content today was focused on communication. The group defined communication and ways to communicate with others. Individuals stated reason why communication is important and some reasons to communicate with others. Patients expressed if they thought they were good at communicating with others and ways they could improve their communication skills. The group identified important parts of communication and some experiences they have had in the past with communication. The group participated in the intervention ?What is that??, where they had a chance to test out their communication skills and identify ways to improve their communication techniques.  ?Clinical Observations/Feedback: ?Patient came to group and was able to identify and participate in different types of communication. Individual was social with peers and staff while participating in the intervention.  ?Aundra Pung LRT/CTRS  ? ? ? ? ? ? ? ? ? ? ?Christ Fullenwider ?04/04/2022 12:01 PM ?

## 2022-04-04 NOTE — Progress Notes (Signed)
08:00  Patient out in the day room with sitter. Appropriate with staff & peers. ? ?10:00  Patient slept till this time.Compliant with breakfast and medications. Safety maintained with 1: 1 sitter. ? ?11:00 Patient spend some time outside with peers and staff. No irritable behaviors noted. ? ?12: 00 Patient knocks at the double door and pointing that he wants to come out.Patient sat at the day room for sometime with the sitter and back to his room. ?

## 2022-04-04 NOTE — Progress Notes (Signed)
Patient refused his Clozaril this evening.  Went through his pills took them out and then swallowed the rest of his meds. Reports not liking how the medication makes him feel. Agreed that if he couldn't sleep or got agitated later in the night that he would take. He denies si hi avh depression and anxiety at this encounter. He has been pleasant and cooperative with no issues to note. Continues with 1:1 sitter for monitoring and support. Will continue with q15 minute safety rounds. ? ? ? ? ?C Butler-Nicholson, LPN ?

## 2022-04-04 NOTE — Plan of Care (Signed)
Patient back in his room. Patient was appropriate with peers in the milieu. Patient enjoyed company with peers. Safety maintained with sitter. ?

## 2022-04-04 NOTE — Progress Notes (Signed)
13:00  Patient in bed watching his phone. States " no " when encouraged for shower. Sitter with patient. ? ?15:00  Patient out in the day room. Appropriate with peers. Safety maintained with sitter. ? ?16:00 Patient back in his room and played with his phone. Patient was loud with his " no " to shower. Sitter with patient. ? ?17:00 Patient had dinner and watching TV in the day room with peers. No inappropriate behaviors noted. Sitter with patient. ? ?18:00 Patient watching TV with peers. Safety maintained with sitter. ?

## 2022-04-04 NOTE — Progress Notes (Signed)
?   04/04/22 1135  ?Clinical Encounter Type  ?Visited With Patient  ?Visit Type Follow-up;Social support  ? ?Chaplain Burris greeted Pt who appeared calm and relaxed in dayroom. Visit was brief but seemed that Pt did recognize chaplain and has settled into unit well. ?Will continue to f/u. ? ?

## 2022-04-04 NOTE — Progress Notes (Signed)
[facility] MD Progress Note ? ?04/04/2022 2:21 PM ?Adam [name]  ?MRN:  [id number] ?Subjective: Patient seen for follow-up.  No new complaints.  Continues to have difficulty with any speech although I think if you go very slowly and mostly use yes and no questions you can get some information.  Patient is getting along well with his sitter.  He is able to come out of his room at times and interact on the ward without disrupting others. ?Principal Problem: Difficulty controlling behavior as late effect of traumatic brain injury (HCC) ?Diagnosis: Principal Problem: ?  Difficulty controlling behavior as late effect of traumatic brain injury (HCC) ?Active Problems: ?  Schizophrenia, chronic condition with acute exacerbation (HCC) ?  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 ?Total Time spent with patient: 30 minutes ? ?Past Psychiatric History: Past history of chronic brain injury with dysfunction ? ?Past Medical History:  ?Past Medical History:  ?Diagnosis Date  ? Myocardial infarction [facility])   ? Schizophrenia (HCC)   ? Stroke [facility])   ? TBI (traumatic brain injury) (HCC)   ? History reviewed. No pertinent surgical history. ?Family History: History reviewed. No pertinent family history. ?Family Psychiatric  History: See previous ?Social History:  ?Social History  ? ?Substance and Sexual Activity  ?Alcohol Use Not Currently  ?   ?Social History  ? ?Substance and Sexual Activity  ?Drug Use Not Currently  ?  ?Social History  ? ?Socioeconomic History  ? Marital status: Single  ?  Spouse name: Not on file  ? Number of children: Not on file  ? Years of education: Not on file  ? Highest education level: Not on file  ?Occupational History  ? Not on file  ?Tobacco Use  ? Smoking status: Never  ?  Passive exposure: Never  ? Smokeless tobacco: Never  ?Vaping Use  ? Vaping Use: Unknown  ?Substance and Sexual Activity  ? Alcohol use: Not Currently  ? Drug use: Not Currently  ? Sexual activity: Not Currently   ?Other Topics Concern  ? Not on file  ?Social History Narrative  ? Not on file  ? ?Social Determinants of Health  ? ?Financial Resource Strain: Not on file  ?Food Insecurity: Not on file  ?Transportation Needs: Not on file  ?Physical Activity: Not on file  ?Stress: Not on file  ?Social Connections: Not on file  ? ?Additional Social History:  ?  ?  ?  ?  ?  ?  ?  ?  ?  ?  ?  ? ?Sleep: Fair ? ?Appetite:  Fair ? ?Current Medications: ?Current Facility-Administered Medications  ?Medication Dose Route Frequency Provider Last Rate Last Admin  ? acetaminophen (TYLENOL) tablet 650 mg  650 mg Oral Q6H PRN Sameeha Rockefeller T, MD      ? alum & mag hydroxide-simeth (MAALOX/MYLANTA) 200-200-20 MG/5ML suspension 30 mL  30 mL Oral Q4H PRN Lowana Hable, [name], MD   30 mL at 03/26/22 2035  ? aspirin EC tablet 81 mg  81 mg Oral Daily Yocheved Depner, [name], MD   81 mg at 04/04/22 0856  ? atorvastatin (LIPITOR) tablet 20 mg  20 mg Oral Daily Marlisha Vanwyk, [name], MD   20 mg at 04/04/22 0856  ? atropine 1 % ophthalmic solution 1 drop  1 drop Sublingual QID [name], MD   1 drop at 04/04/22 1236  ? benztropine (COGENTIN) tablet 0.5 mg  0.5 mg Oral BID Fabien Travelstead, [name], MD   0.5 mg at 04/04/22 0856  ?  clonazePAM (KLONOPIN) tablet 1 mg  1 mg Oral TID Jaycie Kregel, [name], MD   1 mg at 04/04/22 1236  ? cloZAPine (CLOZARIL) tablet 300 mg  300 mg Oral QHS Syliva Mee, [name], MD   300 mg at 04/03/22 2112  ? diphenhydrAMINE (BENADRYL) capsule 50 mg  50 mg Oral Q6H PRN Glorianna Gott, [name], MD   50 mg at 03/26/22 2035  ? Or  ? diphenhydrAMINE (BENADRYL) injection 50 mg  50 mg Intramuscular Q6H PRN Kiara Mcdowell T, MD      ? gabapentin (NEURONTIN) capsule 300 mg  300 mg Oral TID Raiford Fetterman T, MD   300 mg at 04/04/22 1236  ? haloperidol (HALDOL) tablet 5 mg  5 mg Oral TID Kayceon Oki, [name], MD   5 mg at 04/04/22 1236  ? haloperidol (HALDOL) tablet 5 mg  5 mg Oral Q6H PRN Tyrese Capriotti, [name], MD   5 mg at 03/22/22 1421  ? Or  ? haloperidol lactate (HALDOL) injection 5 mg  5 mg  Intramuscular Q6H PRN Greco Gastelum T, MD      ? magnesium hydroxide (MILK OF MAGNESIA) suspension 30 mL  30 mL Oral Daily PRN Conlan Miceli T, MD      ? metoprolol succinate (TOPROL-XL) 24 hr tablet 25 mg  25 mg Oral Daily Jashley Yellin, [name], MD   25 mg at 04/04/22 0856  ? paliperidone (INVEGA SUSTENNA) injection 156 mg  156 mg Intramuscular Q28 days Brandie Lopes, [name], MD   156 mg at 03/29/22 1445  ? sulfamethoxazole-trimethoprim (BACTRIM DS) 800-160 MG per tablet 1 tablet  1 tablet Oral Q12H [name], MD   1 tablet at 04/04/22 0856  ? temazepam (RESTORIL) capsule 15 mg  15 mg Oral QHS Jordyan Hardiman T, MD   15 mg at 04/03/22 2112  ? ziprasidone (GEODON) injection 20 mg  20 mg Intramuscular Q12H PRN Sanjiv Castorena, [name], MD   20 mg at 03/19/22 1551  ? ? ?Lab Results: No results found for this or any previous visit (from the past 48 hour(s)). ? ?Blood Alcohol level:  ?Lab Results  ?Component Value Date  ? ETH <10 07/20/2021  ? ? ?Metabolic Disorder Labs: ?No results found for: HGBA1C, MPG ?No results found for: PROLACTIN ?Lab Results  ?Component Value Date  ? CHOL 167 11/20/2021  ? TRIG 107 11/20/2021  ? HDL 26 (L) 11/20/2021  ? CHOLHDL 6.4 11/20/2021  ? VLDL 21 11/20/2021  ? LDLCALC 120 (H) 11/20/2021  ? ? ?Physical Findings: ?AIMS:  , ,  ,  ,    ?CIWA:    ?COWS:    ? ?Musculoskeletal: ?Strength & Muscle Tone: within normal limits ?Gait & Station: normal ?Patient leans: N/A ? ?Psychiatric Specialty Exam: ? ?Presentation  ?General Appearance: Disheveled ? ?Eye Contact:None ? ?Speech:Clear and Coherent ? ?Speech Volume:Increased ? ?Handedness:Right ? ? ?Mood and Affect  ?Mood:Anxious; Irritable ? ?Affect:Congruent ? ? ?Thought Process  ?Thought Processes:Disorganized ? ?Descriptions of Associations:Loose ? ?Orientation:Partial ? ?Thought Content:Scattered; Illogical ? ?History of Schizophrenia/Schizoaffective disorder:Yes ? ?Duration of Psychotic Symptoms:Greater than six months ? ?Hallucinations:No data recorded ?Ideas of  Reference:-- (UTA) ? ?Suicidal Thoughts:No data recorded ?Homicidal Thoughts:No data recorded ? ?Sensorium  ?Memory:Remote Poor; Recent Poor; Immediate Poor; Other (comment) (impaired memory and cognition) ? ?Judgment:Impaired ? ?Insight:Lacking ? ? ?Executive Functions  ?Concentration:Poor ? ?Attention Span:Poor ? ?Recall:Poor ? ?Fund of Knowledge:Poor ? ?Language:Poor ? ? ?Psychomotor Activity  ?Psychomotor Activity:No data recorded ? ?Assets  ?Assets:Housing; Resilience; Social Support; Talents/Skills ? ? ?  Sleep  ?Sleep:No data recorded ? ? ?Physical Exam: ?Physical Exam ?Vitals and nursing note reviewed.  ?Constitutional:   ?   Appearance: Normal appearance.  ?HENT:  ?   Head: Normocephalic and atraumatic.  ?   Mouth/Throat:  ?   Pharynx: Oropharynx is clear.  ?Eyes:  ?   Pupils: Pupils are equal, round, and reactive to light.  ?Cardiovascular:  ?   Rate and Rhythm: Normal rate and regular rhythm.  ?Pulmonary:  ?   Effort: Pulmonary effort is normal.  ?   Breath sounds: Normal breath sounds.  ?Abdominal:  ?   General: Abdomen is flat.  ?   Palpations: Abdomen is soft.  ?Musculoskeletal:     ?   General: Normal range of motion.  ?Skin: ?   General: Skin is warm and dry.  ?Neurological:  ?   General: No focal deficit present.  ?   Mental Status: He is alert. Mental status is at baseline.  ?Psychiatric:     ?   Attention and Perception: He is inattentive.     ?   Mood and Affect: Mood normal. Affect is labile.     ?   Speech: He is noncommunicative.  ? ?Review of Systems  ?Unable to perform ROS: Language  ?Blood pressure 125/81, pulse (!) 119, temperature 97.8 ?F (36.6 ?C), temperature source Oral, resp. rate 18, height 5\' 8"  (1.727 m), weight 85.8 kg, SpO2 100 %. Body mass index is 28.76 kg/m?. ? ? ?Treatment Plan Summary: ?Plan no change to medication including Clozapine.  Encouragement to patient.  Review plan daily with staff.  Continue to work on possible discharge planning ? ? , MD ?04/04/2022, 2:21  PM ? ?

## 2022-04-05 DIAGNOSIS — S069X0S Unspecified intracranial injury without loss of consciousness, sequela: Secondary | ICD-10-CM | POA: Diagnosis not present

## 2022-04-05 DIAGNOSIS — R4689 Other symptoms and signs involving appearance and behavior: Secondary | ICD-10-CM | POA: Diagnosis not present

## 2022-04-05 LAB — BASIC METABOLIC PANEL
Anion gap: 7 (ref 5–15)
BUN: 19 mg/dL (ref 8–23)
CO2: 23 mmol/L (ref 22–32)
Calcium: 8.9 mg/dL (ref 8.9–10.3)
Chloride: 108 mmol/L (ref 98–111)
Creatinine, Ser: 1.19 mg/dL (ref 0.61–1.24)
GFR, Estimated: >60 mL/min (ref >60)
Glucose, Bld: 110 mg/dL — ABNORMAL HIGH (ref 70–99)
Potassium: 4.5 mmol/L (ref 3.5–5.1)
Sodium: 138 mmol/L (ref 135–145)

## 2022-04-05 LAB — CBC WITH DIFFERENTIAL/PLATELET
Abs Immature Granulocytes: 0.03 10*3/uL (ref 0.00–0.07)
Basophils Absolute: 0.1 10*3/uL (ref 0.0–0.1)
Basophils Relative: 2 %
Eosinophils Absolute: 0 10*3/uL (ref 0.0–0.5)
Eosinophils Relative: 1 %
HCT: 38.5 % — ABNORMAL LOW (ref 39.0–52.0)
Hemoglobin: 12.9 g/dL — ABNORMAL LOW (ref 13.0–17.0)
Immature Granulocytes: 1 %
Lymphocytes Relative: 26 %
Lymphs Abs: 1.4 10*3/uL (ref 0.7–4.0)
MCH: 29.8 pg (ref 26.0–34.0)
MCHC: 33.5 g/dL (ref 30.0–36.0)
MCV: 88.9 fL (ref 80.0–100.0)
Monocytes Absolute: 0.5 10*3/uL (ref 0.1–1.0)
Monocytes Relative: 9 %
Neutro Abs: 3.5 10*3/uL (ref 1.7–7.7)
Neutrophils Relative %: 61 %
Platelets: 192 10*3/uL (ref 150–400)
RBC: 4.33 MIL/uL (ref 4.22–5.81)
RDW: 13.2 % (ref 11.5–15.5)
WBC: 5.6 10*3/uL (ref 4.0–10.5)
nRBC: 0 % (ref 0.0–0.2)

## 2022-04-05 NOTE — Progress Notes (Signed)
Patient was at the double doors, knocking to get this writer's attention. When this writer got back there, patient had his bottle of lotion in his hand. This Clinical research associate asked if he needed some more, and patient stated "no", and started motioning at the lotion bottle as if he wanted another one. This Clinical research associate went and retrieved the barrier cream for incontinence, and patient started smiling and said "yeah"! This writer handed the cream to patient and he walked off going back towards his room. ?

## 2022-04-05 NOTE — Plan of Care (Signed)
?  Problem: Safety: ?Goal: Violent Restraint(s) ?Outcome: Progressing ?  ?Problem: Education: ?Goal: Ability to state activities that reduce stress will improve ?Outcome: Progressing ?  ?Problem: Coping: ?Goal: Ability to identify and develop effective coping behavior will improve ?Outcome: Progressing ?  ?Problem: Self-Concept: ?Goal: Ability to identify factors that promote anxiety will improve ?Outcome: Progressing ?Goal: Level of anxiety will decrease ?Outcome: Progressing ?Goal: Ability to modify response to factors that promote anxiety will improve ?Outcome: Progressing ?  ?

## 2022-04-05 NOTE — Plan of Care (Signed)
D- Patient alert and oriented to person and situation. Patient presents in a pleasant mood on assessment, saying "good morning", and smiling at this writer, stating that he slept good last night and had no complaints to voice to this Clinical research associate. Patient denies SI, HI, AVH, and pain at this time. Patient also denies any signs/symptoms of depression and anxiety. Patient had no stated goals for today. ? ?A- Scheduled medications administered to patient, per MD orders. Support and encouragement provided.  Routine safety checks conducted every 15 minutes.  Patient informed to notify staff with problems or concerns. ? ?R- No adverse drug reactions noted. Patient contracts for safety at this time. Patient compliant with medications and treatment plan. Patient receptive, calm, and cooperative. Patient remains safe at this time. ? ?Problem: Safety: ?Goal: Violent Restraint(s) ?Outcome: Progressing ?  ?Problem: Education: ?Goal: Ability to state activities that reduce stress will improve ?Outcome: Progressing ?  ?Problem: Coping: ?Goal: Ability to identify and develop effective coping behavior will improve ?Outcome: Progressing ?  ?Problem: Self-Concept: ?Goal: Ability to identify factors that promote anxiety will improve ?Outcome: Progressing ?Goal: Level of anxiety will decrease ?Outcome: Progressing ?Goal: Ability to modify response to factors that promote anxiety will improve ?Outcome: Progressing ?  ?

## 2022-04-05 NOTE — Progress Notes (Signed)
Patient's assigned safety sitter told this writer that patient threw away his whole lunch tray. Daphne, NT also stated that she thinks it's because patient was mad that he couldn't go outside. This Clinical research associate stated to patient, earlier, that the unit would go outside after social work group. ?

## 2022-04-05 NOTE — TOC Progression Note (Signed)
Transition of Care (TOC) - Progression Note  ? ? ?Patient Details  ?Name: Monnie Anspach ?MRN: [id number] ?Date of Birth: Mar 07, 1961 ? ?Transition of Care (TOC) CM/SW Contact  ?Alianza Cellar, RN ?Phone Number: ?04/05/2022, 3:02 PM ? ?Clinical Narrative:    ?Received update from [employer] @ [facility] reports they currently have patient on wait list however wait list is expected to be 1 year or longer.  ? ? ?  ?  ? ?Expected Discharge Plan and Services ?  ?  ?  ?  ?  ?                ?  ?  ?  ?  ?  ?  ?  ?  ?  ?  ? ? ?Social Determinants of Health (SDOH) Interventions ?  ? ?Readmission Risk Interventions ?   ? View : No data to display.  ?  ?  ?  ? ? ?

## 2022-04-05 NOTE — Group Note (Signed)
[facility] LCSW Group Therapy Note ? ? ?Group Date: 04/05/2022 ?Start Time: 1400 ?End Time: 1500 ? ? ?Type of Therapy/Topic:  Group Therapy:  Balance in Life ? ?Participation Level:  Did Not Attend  ? ?Description of Group:   ? This group will address the concept of balance and how it feels and looks when one is unbalanced. Patients will be encouraged to process areas in their lives that are out of balance, and identify reasons for remaining unbalanced. Facilitators will guide patients utilizing problem- solving interventions to address and correct the stressor making their life unbalanced. Understanding and applying boundaries will be explored and addressed for obtaining  and maintaining a balanced life. Patients will be encouraged to explore ways to assertively make their unbalanced needs known to significant others in their lives, using other group members and facilitator for support and feedback. ? ?Therapeutic Goals: ?Patient will identify two or more emotions or situations they have that consume much of in their lives. ?Patient will identify signs/triggers that life has become out of balance:  ?Patient will identify two ways to set boundaries in order to achieve balance in their lives:  ?Patient will demonstrate ability to communicate their needs through discussion and/or role plays ? ?Summary of Patient Progress: ?X ? ? ? ?Therapeutic Modalities:   ?Cognitive Behavioral Therapy ?Solution-Focused Therapy ?Assertiveness Training ? ? ?Glenis Smoker, LCSW ?

## 2022-04-05 NOTE — Progress Notes (Signed)
Patient was at the double doors wanting this Probation officer to come back to him. When this writer got back there, patient was pointing at his assigned safety sitter saying "[place]". This Probation officer did not understand what [place] means to patient. However, when this Probation officer asked patient if he wanted to go outside, he said "yeah, yeah". This Probation officer explained that patients/staff would go out in about twenty minutes and patient gave this writer a thumbs up and walked off. ?

## 2022-04-05 NOTE — TOC Progression Note (Addendum)
Transition of Care (TOC) - Progression Note  ? ? ?Patient Details  ?Name: Kanton Kamel ?MRN: [id number] ?Date of Birth: [birth year] ? ?Transition of Care (TOC) CM/SW Contact  ?[place] Cellar, RN ?Phone Number: ?04/05/2022, 10:39 AM ? ?Clinical Narrative:    ?Spoke to [name] DC Planner @ [facility] [phone] requesting assistance with dc planning. Fleet Contras confirmed DC planning position is meant to assist patients with OP follow up and has no resources to assist with placement. Fleet Contras reports she has reviewed case with her supervisor and has no information to assist. Fleet Contras will close case for dc planner but remains available for assistance after discharge with locating providers or resources in the community.  ? ?Outreach to [employer] via secure email requesting follow up on waiting list and request made by TOC last week for financial information needed by [employer].  ? ?  ?  ? ?Expected Discharge Plan and Services ?  ?  ?  ?  ?  ?                ?  ?  ?  ?  ?  ?  ?  ?  ?  ?  ? ? ?Social Determinants of Health (SDOH) Interventions ?  ? ?Readmission Risk Interventions ?   ? View : No data to display.  ?  ?  ?  ? ? ?

## 2022-04-05 NOTE — Progress Notes (Signed)
1:1 Patient Hourly Rounding ? ?0800: Patient is in the dayroom, eating breakfast, with his assigned safety sitter present. ? ?0900: Patient is in the dayroom, with his assigned safety sitter present. ? ?1000: Patient is in bed, awake, with his assigned safety sitter present. ? ?1100: Patient is in his room, with his assigned safety sitter present. ? ?1200: Patient is in the dayroom, waiting for lunch, with his assigned safety sitter present. ? ?1300: Patient is in his room, sitting on his bed, with this Clinical research associate as his assigned Recruitment consultant. ? ?1400: Patient is in the hallway, with his assigned safety sitter present. ? ?1500: Patient is in the hallway, looking out of the double doors, with his assigned safety sitter present. ? ?1600: Patient is outside, in the courtyard, with staff and other members on the unit. Patient's assigned safety sitter is also present. ? ?1700: Patient is in the dayroom, with other members on the unit, and his assigned Recruitment consultant. ? ?1800: Patient is in his room, calm and composed, with his assigned safety sitter present. ? ?1900: Patient is in his room, calm and content, with this Clinical research associate as his assigned Recruitment consultant. ? ?

## 2022-04-05 NOTE — TOC Progression Note (Addendum)
Transition of Care (TOC) - Progression Note  ? ? ?Patient Details  ?Name: Adam [name] ?MRN: [id number] ?Date of Birth: 04-20-1961 ? ?Transition of Care (TOC) CM/SW Contact  ?[place] Cellar, RN ?Phone Number: ?04/05/2022, 1:47 PM ? ?Clinical Narrative:    ?Received confirmation from [facility] with previously requested information for [employer]: Current funding stream is full Medicaid, [place]ADQ[employer] Medicaid and he is a Science writer. Confirmed patient is not on the TBI waiver. No updates on waiting list.  ? ?Attempted to contact Toby @ [facility] for follow [phone]: Line busy x2 ? ? ?  ?  ? ?Expected Discharge Plan and Services ?  ?  ?  ?  ?  ?                ?  ?  ?  ?  ?  ?  ?  ?  ?  ?  ? ? ?Social Determinants of Health (SDOH) Interventions ?  ? ?Readmission Risk Interventions ?   ? View : No data to display.  ?  ?  ?  ? ? ?

## 2022-04-05 NOTE — Progress Notes (Signed)
Patient is in bed asleep. Continues to have 1:1 sitter for support and safety.  Will also continue with q 15 minute safety rounds. ? ? ? ? ? ? ?C Butler-Nicholson, LPN ?

## 2022-04-05 NOTE — TOC Progression Note (Signed)
Transition of Care (TOC) - Progression Note  ? ? ?Patient Details  ?Name: Adam [name] ?MRN: [id number] ?Date of Birth: 1960-12-30 ? ?Transition of Care (TOC) CM/SW Contact  ?Williams Cellar, RN ?Phone Number: ?04/05/2022, 10:15 AM ? ?Clinical Narrative:    ?LVMM for [name] Dc Planner @ [facility] [phone] requesting assistance with dc planning.  ? ? ?  ?  ? ?Expected Discharge Plan and Services ?  ?  ?  ?  ?  ?                ?  ?  ?  ?  ?  ?  ?  ?  ?  ?  ? ? ?Social Determinants of Health (SDOH) Interventions ?  ? ?Readmission Risk Interventions ?   ? View : No data to display.  ?  ?  ?  ? ? ?

## 2022-04-05 NOTE — Progress Notes (Signed)
[facility] MD Progress Note ? ?04/05/2022 4:19 PM ?Adam [name]  ?MRN:  [id number] ?Subjective: No new complaints.  He did refuse his clozapine last night. ?Principal Problem: Difficulty controlling behavior as late effect of traumatic brain injury (HCC) ?Diagnosis: Principal Problem: ?  Difficulty controlling behavior as late effect of traumatic brain injury (HCC) ?Active Problems: ?  Schizophrenia, chronic condition with acute exacerbation (HCC) ?  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 ?Total Time spent with patient: 15 minutes ? ?Past Psychiatric History: Past history of chronic cognitive damage and mental illness ? ?Past Medical History:  ?Past Medical History:  ?Diagnosis Date  ? Myocardial infarction [facility])   ? Schizophrenia (HCC)   ? Stroke [facility])   ? TBI (traumatic brain injury) (HCC)   ? History reviewed. No pertinent surgical history. ?Family History: History reviewed. No pertinent family history. ?Family Psychiatric  History: See previous ?Social History:  ?Social History  ? ?Substance and Sexual Activity  ?Alcohol Use Not Currently  ?   ?Social History  ? ?Substance and Sexual Activity  ?Drug Use Not Currently  ?  ?Social History  ? ?Socioeconomic History  ? Marital status: Single  ?  Spouse name: Not on file  ? Number of children: Not on file  ? Years of education: Not on file  ? Highest education level: Not on file  ?Occupational History  ? Not on file  ?Tobacco Use  ? Smoking status: Never  ?  Passive exposure: Never  ? Smokeless tobacco: Never  ?Vaping Use  ? Vaping Use: Unknown  ?Substance and Sexual Activity  ? Alcohol use: Not Currently  ? Drug use: Not Currently  ? Sexual activity: Not Currently  ?Other Topics Concern  ? Not on file  ?Social History Narrative  ? Not on file  ? ?Social Determinants of Health  ? ?Financial Resource Strain: Not on file  ?Food Insecurity: Not on file  ?Transportation Needs: Not on file  ?Physical Activity: Not on file  ?Stress: Not on file   ?Social Connections: Not on file  ? ?Additional Social History:  ?  ?  ?  ?  ?  ?  ?  ?  ?  ?  ?  ? ?Sleep: Fair ? ?Appetite:  Fair ? ?Current Medications: ?Current Facility-Administered Medications  ?Medication Dose Route Frequency Provider Last Rate Last Admin  ? acetaminophen (TYLENOL) tablet 650 mg  650 mg Oral Q6H PRN Loyola Santino T, MD      ? alum & mag hydroxide-simeth (MAALOX/MYLANTA) 200-200-20 MG/5ML suspension 30 mL  30 mL Oral Q4H PRN Kianni Lheureux, [name], MD   30 mL at 03/26/22 2035  ? aspirin EC tablet 81 mg  81 mg Oral Daily Syanne Looney, [name], MD   81 mg at 04/05/22 0815  ? atorvastatin (LIPITOR) tablet 20 mg  20 mg Oral Daily Elizabella Nolet T, MD   20 mg at 04/05/22 0815  ? atropine 1 % ophthalmic solution 1 drop  1 drop Sublingual QID [name], MD   1 drop at 04/05/22 1249  ? benztropine (COGENTIN) tablet 0.5 mg  0.5 mg Oral BID Jeannelle Wiens, [name], MD   0.5 mg at 04/05/22 0815  ? clonazePAM (KLONOPIN) tablet 1 mg  1 mg Oral TID Edahi Kroening, [name], MD   1 mg at 04/05/22 1249  ? cloZAPine (CLOZARIL) tablet 300 mg  300 mg Oral QHS Tahra Hitzeman, [name], MD   300 mg at 04/04/22 2103  ? diphenhydrAMINE (BENADRYL) capsule 50 mg  50 mg Oral Q6H PRN Talayah Picardi, [name], MD   50 mg at 03/26/22 2035  ? Or  ? diphenhydrAMINE (BENADRYL) injection 50 mg  50 mg Intramuscular Q6H PRN Anissia Wessells T, MD      ? gabapentin (NEURONTIN) capsule 300 mg  300 mg Oral TID Jerl Munyan T, MD   300 mg at 04/05/22 1249  ? haloperidol (HALDOL) tablet 5 mg  5 mg Oral TID Asharia Lotter, [name], MD   5 mg at 04/05/22 1249  ? haloperidol (HALDOL) tablet 5 mg  5 mg Oral Q6H PRN Diontre Harps, [name], MD   5 mg at 03/22/22 1421  ? Or  ? haloperidol lactate (HALDOL) injection 5 mg  5 mg Intramuscular Q6H PRN Leiya Keesey T, MD      ? magnesium hydroxide (MILK OF MAGNESIA) suspension 30 mL  30 mL Oral Daily PRN Lucca Ballo T, MD      ? metoprolol succinate (TOPROL-XL) 24 hr tablet 25 mg  25 mg Oral Daily Gregorio Worley, [name], MD   25 mg at 04/05/22 0815  ?  paliperidone (INVEGA SUSTENNA) injection 156 mg  156 mg Intramuscular Q28 days Joelie Schou, [name], MD   156 mg at 03/29/22 1445  ? sulfamethoxazole-trimethoprim (BACTRIM DS) 800-160 MG per tablet 1 tablet  1 tablet Oral Q12H [name], MD   1 tablet at 04/05/22 0815  ? temazepam (RESTORIL) capsule 15 mg  15 mg Oral QHS Laconda Basich T, MD   15 mg at 04/04/22 2103  ? ziprasidone (GEODON) injection 20 mg  20 mg Intramuscular Q12H PRN Jonty Morrical, [name], MD   20 mg at 03/19/22 1551  ? ? ?Lab Results:  ?Results for orders placed or performed during the hospital encounter of 03/19/22 (from the past 48 hour(s))  ?CBC with Differential/Platelet     Status: Abnormal  ? Collection Time: 04/05/22 10:24 AM  ?Result Value Ref Range  ? WBC 5.6 4.0 - 10.5 K/uL  ? RBC 4.33 4.22 - 5.81 MIL/uL  ? Hemoglobin 12.9 (L) 13.0 - 17.0 g/dL  ? HCT 38.5 (L) 39.0 - 52.0 %  ? MCV 88.9 80.0 - 100.0 fL  ? MCH 29.8 26.0 - 34.0 pg  ? MCHC 33.5 30.0 - 36.0 g/dL  ? RDW 13.2 11.5 - 15.5 %  ? Platelets 192 150 - 400 K/uL  ? nRBC 0.0 0.0 - 0.2 %  ? Neutrophils Relative % 61 %  ? Neutro Abs 3.5 1.7 - 7.7 K/uL  ? Lymphocytes Relative 26 %  ? Lymphs Abs 1.4 0.7 - 4.0 K/uL  ? Monocytes Relative 9 %  ? Monocytes Absolute 0.5 0.1 - 1.0 K/uL  ? Eosinophils Relative 1 %  ? Eosinophils Absolute 0.0 0.0 - 0.5 K/uL  ? Basophils Relative 2 %  ? Basophils Absolute 0.1 0.0 - 0.1 K/uL  ? Immature Granulocytes 1 %  ? Abs Immature Granulocytes 0.03 0.00 - 0.07 K/uL  ?  Comment: Performed at [facility], [address] [place], [place] [address]  ?Basic metabolic panel     Status: Abnormal  ? Collection Time: 04/05/22 10:24 AM  ?Result Value Ref Range  ? Sodium 138 135 - 145 mmol/L  ? Potassium 4.5 3.5 - 5.1 mmol/L  ? Chloride 108 98 - 111 mmol/L  ? CO2 23 22 - 32 mmol/L  ? Glucose, Bld 110 (H) 70 - 99 mg/dL  ?  Comment: Glucose reference range applies only to samples taken after fasting for at least  8 hours.  ? BUN 19 8 - 23 mg/dL  ? Creatinine, Ser 1.19 0.61  - 1.24 mg/dL  ? Calcium 8.9 8.9 - 10.3 mg/dL  ? GFR, Estimated >60 >60 mL/min  ?  Comment: (NOTE) ?Calculated using the CKD-EPI Creatinine Equation (2021) ?  ? Anion gap 7 5 - 15  ?  Comment: Performed at [facility], [address] [place], [place] [address]  ? ? ?Blood Alcohol level:  ?Lab Results  ?Component Value Date  ? ETH <10 07/20/2021  ? ? ?Metabolic Disorder Labs: ?No results found for: HGBA1C, MPG ?No results found for: PROLACTIN ?Lab Results  ?Component Value Date  ? CHOL 167 11/20/2021  ? TRIG 107 11/20/2021  ? HDL 26 (L) 11/20/2021  ? CHOLHDL 6.4 11/20/2021  ? VLDL 21 11/20/2021  ? LDLCALC 120 (H) 11/20/2021  ? ? ?Physical Findings: ?AIMS:  , ,  ,  ,    ?CIWA:    ?COWS:    ? ?Musculoskeletal: ?Strength & Muscle Tone: within normal limits ?Gait & Station: normal ?Patient leans: N/A ? ?Psychiatric Specialty Exam: ? ?Presentation  ?General Appearance: Disheveled ? ?Eye Contact:None ? ?Speech:Clear and Coherent ? ?Speech Volume:Increased ? ?Handedness:Right ? ? ?Mood and Affect  ?Mood:Anxious; Irritable ? ?Affect:Congruent ? ? ?Thought Process  ?Thought Processes:Disorganized ? ?Descriptions of Associations:Loose ? ?Orientation:Partial ? ?Thought Content:Scattered; Illogical ? ?History of Schizophrenia/Schizoaffective disorder:Yes ? ?Duration of Psychotic Symptoms:Greater than six months ? ?Hallucinations:No data recorded ?Ideas of Reference:-- (UTA) ? ?Suicidal Thoughts:No data recorded ?Homicidal Thoughts:No data recorded ? ?Sensorium  ?Memory:Remote Poor; Recent Poor; Immediate Poor; Other (comment) (impaired memory and cognition) ? ?Judgment:Impaired ? ?Insight:Lacking ? ? ?Executive Functions  ?Concentration:Poor ? ?Attention Span:Poor ? ?Recall:Poor ? ?Fund of Knowledge:Poor ? ?Language:Poor ? ? ?Psychomotor Activity  ?Psychomotor Activity:No data recorded ? ?Assets  ?Assets:Housing; Resilience; Social Support; Talents/Skills ? ? ?Sleep  ?Sleep:No data recorded ? ? ?Physical  Exam: ?Physical Exam ?Vitals and nursing note reviewed.  ?Constitutional:   ?   Appearance: Normal appearance.  ?HENT:  ?   Head: Normocephalic and atraumatic.  ?   Mouth/Throat:  ?   Pharynx: Oropharynx is clear.  ?Eyes:  ?

## 2022-04-06 DIAGNOSIS — S069X0S Unspecified intracranial injury without loss of consciousness, sequela: Secondary | ICD-10-CM | POA: Diagnosis not present

## 2022-04-06 DIAGNOSIS — R4689 Other symptoms and signs involving appearance and behavior: Secondary | ICD-10-CM | POA: Diagnosis not present

## 2022-04-06 NOTE — Plan of Care (Signed)
D- Patient alert and oriented to person and situation. Patient presents in a preoccupied, but pleasant mood on assessment. He slept in all morning and got up just in time for lunch. Patient had no complaints to voice to this writer, although he kept saying "serial killers", pointing at the socket in the wall. Although patient is childlike, and only speaks a few words, when asked about any signs/symptoms of depression/anxiety, patient smiled and said "no". Patient also denies SI, HI, AVH, and pain at this time. When this writer asked patient how he was feeling, he stated "good". ? ?A- Scheduled medications administered to patient, per MD orders. Support and encouragement provided.  Routine safety checks conducted every 15 minutes.  Patient informed to notify staff with problems or concerns. ? ?R- No adverse drug reactions noted. Patient contracts for safety at this time. Patient compliant with medications and treatment plan. Patient receptive, calm, and cooperative. Patient interacts well with others on the unit.  Patient remains safe at this time. ? ?Problem: Safety: ?Goal: Violent Restraint(s) ?Outcome: Progressing ?  ?Problem: Education: ?Goal: Ability to state activities that reduce stress will improve ?Outcome: Progressing ?  ?Problem: Coping: ?Goal: Ability to identify and develop effective coping behavior will improve ?Outcome: Progressing ?  ?Problem: Self-Concept: ?Goal: Ability to identify factors that promote anxiety will improve ?Outcome: Progressing ?Goal: Level of anxiety will decrease ?Outcome: Progressing ?Goal: Ability to modify response to factors that promote anxiety will improve ?Outcome: Progressing ?  ?

## 2022-04-06 NOTE — Progress Notes (Signed)
Patient is in room yelling.  He is sitting on the side of the bed and has vomited on himself and his bed.  He agreed to a shower and change of clothes. Bed linen was also changed. Remains with 1:1 sitter for monitoring and safety.  Will continue monitor with q15 minute safety. ? ? ? ? ?C Butler-Nicholson, LPN ?

## 2022-04-06 NOTE — Progress Notes (Signed)
This writer went to give patient his morning medication, however, he was asleep. This writer will administer morning medication once he wakes up. MD will be notified. ?

## 2022-04-06 NOTE — Plan of Care (Signed)
?  Problem: Safety: ?Goal: Violent Restraint(s) ?Outcome: Progressing ?  ?Problem: Self-Concept: ?Goal: Ability to modify response to factors that promote anxiety will improve ?Outcome: Progressing ?  ?

## 2022-04-06 NOTE — Progress Notes (Signed)
Recreation Therapy Notes ? ? ?Date: 04/06/2022 ? ?Time: 10:40 am   ? ?Location: Craft room   ? ?Behavioral response: N/A ?  ?Intervention Topic: Stress Management   ? ?Discussion/Intervention: ?Patient refused to attend group.  ? ?Clinical Observations/Feedback:  ?Patient refused to attend group.  ?  ?[name] LRT/CTRS ? ? ? ? ? ? ? ?Adam [name] ?04/06/2022 12:56 PM ?

## 2022-04-06 NOTE — Progress Notes (Signed)
1:1 Patient Hourly Rounding ? ?0800: Patient is in bed, asleep, with his assigned safety present. ? ?0900: Patient is still in bed, sleeping, with his assigned safety sitter present. ? ?1000: Patient is still in bed, asleep, with his assigned safety sitter present. ? ?1100: Patient is in bed, asleep, with his assigned safety sitter present. ? ?1200: Patient is in the dayroom, eating lunch, with his assigned safety sitter present. ? ?1300: Patient is in the bathroom, brushing his teeth, with his assigned safety sitter present. ? ?1400: Patient is outside, in the courtyard with staff and other members on the unit, and this Clinical research associate as his assigned Recruitment consultant present. ? ?1500: Patient is in the dayroom, with his assigned safety sitter present. ? ?1600: Patient is in the dayroom, sitting with other patients, with his assigned safety sitter present. ? ?1700: Patient is in the dayroom, sitting at the table with other patients on the unit, with his assigned safety sitter present. ? ?1800: Patient is in the hallway, with his assigned safety sitter present. ? ?1900: Patient is in the hallway, talking with this Clinical research associate and his assigned Recruitment consultant. ?

## 2022-04-06 NOTE — Progress Notes (Signed)
Patient took his scheduled morning medication after eating lunch. ?

## 2022-04-06 NOTE — Plan of Care (Signed)
°  Problem: Group Participation °Goal: STG - Patient will engage in groups with a calm and appropriate mood at least 2x within 5 recreation therapy group sessions °Description: STG - Patient will engage in groups with a calm and appropriate mood at least 2x within 5 recreation therapy group sessions °Outcome: Progressing °  °

## 2022-04-06 NOTE — Progress Notes (Signed)
MD was made aware that patient is asleep and is ok with this writer administering medications, once he wakes up. ?

## 2022-04-06 NOTE — Progress Notes (Signed)
Patient is med compliant this evening.  He is in a pleasant cooperative mood.  He denies si hi avh depression and anxiety at this encounter.  Continues with 1:1 sitter for safety and monitoring. ? ? ? ? ?C Butler-Nicholson, LPN ?

## 2022-04-06 NOTE — Progress Notes (Signed)
[facility] MD Progress Note ? ?04/06/2022 2:59 PM ?Adam [name]  ?MRN:  [id number] ?Subjective: Follow-up for this 61 year old man with brain injury and schizophrenia.  No new complaints.  Behavior has been stable.  He is able to come out of his room and interact on the unit without being disruptive at times.  He took all his medicine including his clozapine last night ?Principal Problem: Difficulty controlling behavior as late effect of traumatic brain injury (HCC) ?Diagnosis: Principal Problem: ?  Difficulty controlling behavior as late effect of traumatic brain injury (HCC) ?Active Problems: ?  Schizophrenia, chronic condition with acute exacerbation (HCC) ?  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 ?Total Time spent with patient: 30 minutes ? ?Past Psychiatric History: Past history of schizophrenia and severe brain injury ? ?Past Medical History:  ?Past Medical History:  ?Diagnosis Date  ? Myocardial infarction [facility])   ? Schizophrenia (HCC)   ? Stroke [facility])   ? TBI (traumatic brain injury) (HCC)   ? History reviewed. No pertinent surgical history. ?Family History: History reviewed. No pertinent family history. ?Family Psychiatric  History: See previous ?Social History:  ?Social History  ? ?Substance and Sexual Activity  ?Alcohol Use Not Currently  ?   ?Social History  ? ?Substance and Sexual Activity  ?Drug Use Not Currently  ?  ?Social History  ? ?Socioeconomic History  ? Marital status: Single  ?  Spouse name: Not on file  ? Number of children: Not on file  ? Years of education: Not on file  ? Highest education level: Not on file  ?Occupational History  ? Not on file  ?Tobacco Use  ? Smoking status: Never  ?  Passive exposure: Never  ? Smokeless tobacco: Never  ?Vaping Use  ? Vaping Use: Unknown  ?Substance and Sexual Activity  ? Alcohol use: Not Currently  ? Drug use: Not Currently  ? Sexual activity: Not Currently  ?Other Topics Concern  ? Not on file  ?Social History Narrative  ? Not on  file  ? ?Social Determinants of Health  ? ?Financial Resource Strain: Not on file  ?Food Insecurity: Not on file  ?Transportation Needs: Not on file  ?Physical Activity: Not on file  ?Stress: Not on file  ?Social Connections: Not on file  ? ?Additional Social History:  ?  ?  ?  ?  ?  ?  ?  ?  ?  ?  ?  ? ?Sleep: Fair ? ?Appetite:  Fair ? ?Current Medications: ?Current Facility-Administered Medications  ?Medication Dose Route Frequency Provider Last Rate Last Admin  ? acetaminophen (TYLENOL) tablet 650 mg  650 mg Oral Q6H PRN Tamikka Pilger T, MD      ? alum & mag hydroxide-simeth (MAALOX/MYLANTA) 200-200-20 MG/5ML suspension 30 mL  30 mL Oral Q4H PRN Dejour Vos, [name], MD   30 mL at 03/26/22 2035  ? aspirin EC tablet 81 mg  81 mg Oral Daily Halley Kincer, [name], MD   81 mg at 04/06/22 1229  ? atorvastatin (LIPITOR) tablet 20 mg  20 mg Oral Daily Marjo Grosvenor T, MD   20 mg at 04/06/22 1229  ? atropine 1 % ophthalmic solution 1 drop  1 drop Sublingual QID [name], MD   1 drop at 04/06/22 1227  ? benztropine (COGENTIN) tablet 0.5 mg  0.5 mg Oral BID Aliscia Clayton, [name], MD   0.5 mg at 04/06/22 1230  ? clonazePAM (KLONOPIN) tablet 1 mg  1 mg Oral TID Genesia Caslin T,  MD   1 mg at 04/06/22 1228  ? cloZAPine (CLOZARIL) tablet 300 mg  300 mg Oral QHS Agustina Witzke, [name], MD   300 mg at 04/05/22 2142  ? diphenhydrAMINE (BENADRYL) capsule 50 mg  50 mg Oral Q6H PRN Lilyth Lawyer, [name], MD   50 mg at 03/26/22 2035  ? Or  ? diphenhydrAMINE (BENADRYL) injection 50 mg  50 mg Intramuscular Q6H PRN Rhodie Cienfuegos T, MD      ? gabapentin (NEURONTIN) capsule 300 mg  300 mg Oral TID Zacchary Pompei T, MD   300 mg at 04/06/22 1227  ? haloperidol (HALDOL) tablet 5 mg  5 mg Oral TID Faten Frieson, [name], MD   5 mg at 04/06/22 1228  ? haloperidol (HALDOL) tablet 5 mg  5 mg Oral Q6H PRN Raahi Korber, [name], MD   5 mg at 03/22/22 1421  ? Or  ? haloperidol lactate (HALDOL) injection 5 mg  5 mg Intramuscular Q6H PRN Fabiola Mudgett T, MD      ? magnesium hydroxide (MILK  OF MAGNESIA) suspension 30 mL  30 mL Oral Daily PRN Xavior Niazi T, MD      ? metoprolol succinate (TOPROL-XL) 24 hr tablet 25 mg  25 mg Oral Daily Hunter Pinkard, [name], MD   25 mg at 04/06/22 1229  ? paliperidone (INVEGA SUSTENNA) injection 156 mg  156 mg Intramuscular Q28 days Laila Myhre, [name], MD   156 mg at 03/29/22 1445  ? temazepam (RESTORIL) capsule 15 mg  15 mg Oral QHS Meldon Hanzlik T, MD   15 mg at 04/05/22 2142  ? ziprasidone (GEODON) injection 20 mg  20 mg Intramuscular Q12H PRN Kinsey Cowsert, [name], MD   20 mg at 03/19/22 1551  ? ? ?Lab Results:  ?Results for orders placed or performed during the hospital encounter of 03/19/22 (from the past 48 hour(s))  ?CBC with Differential/Platelet     Status: Abnormal  ? Collection Time: 04/05/22 10:24 AM  ?Result Value Ref Range  ? WBC 5.6 4.0 - 10.5 K/uL  ? RBC 4.33 4.22 - 5.81 MIL/uL  ? Hemoglobin 12.9 (L) 13.0 - 17.0 g/dL  ? HCT 38.5 (L) 39.0 - 52.0 %  ? MCV 88.9 80.0 - 100.0 fL  ? MCH 29.8 26.0 - 34.0 pg  ? MCHC 33.5 30.0 - 36.0 g/dL  ? RDW 13.2 11.5 - 15.5 %  ? Platelets 192 150 - 400 K/uL  ? nRBC 0.0 0.0 - 0.2 %  ? Neutrophils Relative % 61 %  ? Neutro Abs 3.5 1.7 - 7.7 K/uL  ? Lymphocytes Relative 26 %  ? Lymphs Abs 1.4 0.7 - 4.0 K/uL  ? Monocytes Relative 9 %  ? Monocytes Absolute 0.5 0.1 - 1.0 K/uL  ? Eosinophils Relative 1 %  ? Eosinophils Absolute 0.0 0.0 - 0.5 K/uL  ? Basophils Relative 2 %  ? Basophils Absolute 0.1 0.0 - 0.1 K/uL  ? Immature Granulocytes 1 %  ? Abs Immature Granulocytes 0.03 0.00 - 0.07 K/uL  ?  Comment: Performed at [facility], [address], [place], [place] [address]  ?Basic metabolic panel     Status: Abnormal  ? Collection Time: 04/05/22 10:24 AM  ?Result Value Ref Range  ? Sodium 138 135 - 145 mmol/L  ? Potassium 4.5 3.5 - 5.1 mmol/L  ? Chloride 108 98 - 111 mmol/L  ? CO2 23 22 - 32 mmol/L  ? Glucose, Bld 110 (H) 70 - 99 mg/dL  ?  Comment:  Glucose reference range applies only to samples taken after fasting for at least 8  hours.  ? BUN 19 8 - 23 mg/dL  ? Creatinine, Ser 1.19 0.61 - 1.24 mg/dL  ? Calcium 8.9 8.9 - 10.3 mg/dL  ? GFR, Estimated >60 >60 mL/min  ?  Comment: (NOTE) ?Calculated using the CKD-EPI Creatinine Equation (2021) ?  ? Anion gap 7 5 - 15  ?  Comment: Performed at [facility], [address] [place], [place] [address]  ? ? ?Blood Alcohol level:  ?Lab Results  ?Component Value Date  ? ETH <10 07/20/2021  ? ? ?Metabolic Disorder Labs: ?No results found for: HGBA1C, MPG ?No results found for: PROLACTIN ?Lab Results  ?Component Value Date  ? CHOL 167 11/20/2021  ? TRIG 107 11/20/2021  ? HDL 26 (L) 11/20/2021  ? CHOLHDL 6.4 11/20/2021  ? VLDL 21 11/20/2021  ? LDLCALC 120 (H) 11/20/2021  ? ? ?Physical Findings: ?AIMS:  , ,  ,  ,    ?CIWA:    ?COWS:    ? ?Musculoskeletal: ?Strength & Muscle Tone: within normal limits ?Gait & Station: normal ?Patient leans: N/A ? ?Psychiatric Specialty Exam: ? ?Presentation  ?General Appearance: Disheveled ? ?Eye Contact:None ? ?Speech:Clear and Coherent ? ?Speech Volume:Increased ? ?Handedness:Right ? ? ?Mood and Affect  ?Mood:Anxious; Irritable ? ?Affect:Congruent ? ? ?Thought Process  ?Thought Processes:Disorganized ? ?Descriptions of Associations:Loose ? ?Orientation:Partial ? ?Thought Content:Scattered; Illogical ? ?History of Schizophrenia/Schizoaffective disorder:Yes ? ?Duration of Psychotic Symptoms:Greater than six months ? ?Hallucinations:No data recorded ?Ideas of Reference:-- (UTA) ? ?Suicidal Thoughts:No data recorded ?Homicidal Thoughts:No data recorded ? ?Sensorium  ?Memory:Remote Poor; Recent Poor; Immediate Poor; Other (comment) (impaired memory and cognition) ? ?Judgment:Impaired ? ?Insight:Lacking ? ? ?Executive Functions  ?Concentration:Poor ? ?Attention Span:Poor ? ?Recall:Poor ? ?Fund of Knowledge:Poor ? ?Language:Poor ? ? ?Psychomotor Activity  ?Psychomotor Activity:No data recorded ? ?Assets  ?Assets:Housing; Resilience; Social Support;  Talents/Skills ? ? ?Sleep  ?Sleep:No data recorded ? ? ?Physical Exam: ?Physical Exam ?Vitals and nursing note reviewed.  ?Constitutional:   ?   Appearance: Normal appearance.  ?HENT:  ?   Head: Normocephalic and atraumati

## 2022-04-07 DIAGNOSIS — R4689 Other symptoms and signs involving appearance and behavior: Secondary | ICD-10-CM | POA: Diagnosis not present

## 2022-04-07 DIAGNOSIS — S069X0S Unspecified intracranial injury without loss of consciousness, sequela: Secondary | ICD-10-CM | POA: Diagnosis not present

## 2022-04-07 NOTE — Group Note (Signed)
LCSW Group Therapy Note ? ?Group Date: 04/07/2022 ?Start Time: 1310 ?End Time: 1400 ? ? ?Type of Therapy and Topic:  Group Therapy - Healthy vs Unhealthy Coping Skills ? ?Participation Level:  Minimal  ? ?Description of Group ?The focus of this group was to determine what unhealthy coping techniques typically are used by group members and what healthy coping techniques would be helpful in coping with various problems. Patients were guided in becoming aware of the differences between healthy and unhealthy coping techniques. Patients were asked to identify 2-3 healthy coping skills they would like to learn to use more effectively. ? ?Therapeutic Goals ?Patients learned that coping is what human beings do all day long to deal with various situations in their lives ?Patients defined and discussed healthy vs unhealthy coping techniques ?Patients identified their preferred coping techniques and identified whether these were healthy or unhealthy ?Patients determined 2-3 healthy coping skills they would like to become more familiar with and use more often. ?Patients provided support and ideas to each other ? ? ?Summary of Patient Progress: Patient presented a few minutes after the start of group. Patient was observed speaking out of turn. Patient left prior to the end of the session.  ? ? ?Therapeutic Modalities ?Cognitive Behavioral Therapy ?Motivational Interviewing ? ?[name], LCSWA ?04/07/2022  4:17 PM   ?

## 2022-04-07 NOTE — Progress Notes (Signed)
Patient alert and oriented x 2 with periods of confusion to place and situation unable to assess SI/HI/AVH, he was not receptive to staff this evening, he was agitated and irritable and even refused evening medication. Patient continues to be on 1:1 sitter at bedside, no distress noted 15 minute safety checks maintained will continue to monitor closely.   ?

## 2022-04-07 NOTE — Progress Notes (Signed)
1:1 Patient hourly rounding ? ?0800: Patient is in bed, asleep. Assigned sitter is present, with patient  ? ?0900: patient is in bed, asleep. Assigned sitter is present, with patient. ? ?1000: patient is awake, and has had breakfast. Patient compliant with medication administration and cooperative during vitals. Assigned sitter is present, with patient.  ? ?1100: Patient is in bedroom.Assigned sitter is present, with patient. ? ?1200: Patient is in the dayroom. Assigned sitter is present, with patient. ? ?1300: Patient is in craft/group room. Assigned sitter is present, with patient. ? ?1400: Patient is in bedroom. Assigned sitter is present, with patient. ? ?1500: Patient outside in the courtyard. Assigned sitter is present, with patient. ? ?1600: Patient outside in the courtyard. Assigned sitter is present, with patient. ? ?1700: Patient in dayroom. Patient compliant with medication administration. Assigned sitter is present, with patient. ? ?1800: Patient is in the dayroom. Assigned sitter is present, with patient.  ? ?1900: Patient is in the dayroom. Assigned sitter is present, with patient. ?

## 2022-04-07 NOTE — Progress Notes (Signed)
[facility] MD Progress Note ? ?04/07/2022 2:39 PM ?Adam [name]  ?MRN:  [id number] ?Subjective: Patient seen for follow-up.  No new complaint.  Able to come out and attend groups at times.  Not disruptive or aggressive.  No evident change in behavior or mental state ?Principal Problem: Difficulty controlling behavior as late effect of traumatic brain injury (HCC) ?Diagnosis: Principal Problem: ?  Difficulty controlling behavior as late effect of traumatic brain injury (HCC) ?Active Problems: ?  Schizophrenia, chronic condition with acute exacerbation (HCC) ?  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 ?Total Time spent with patient: 30 minutes ? 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 ?Past Medical History:  ?Past Medical History:  ?Diagnosis Date  ? Myocardial infarction [facility])   ? Schizophrenia (HCC)   ? Stroke [facility])   ? TBI (traumatic brain injury) (HCC)   ? History reviewed. No pertinent surgical history. ?Family History: History reviewed. No pertinent family history. ?Family Psychiatric  History: See previous ?Social History:  ?Social History  ? ?Substance and Sexual Activity  ?Alcohol Use Not Currently  ?   ?Social History  ? ?Substance and Sexual Activity  ?Drug Use Not Currently  ?  ?Social History  ? ?Socioeconomic History  ? Marital status: Single  ?  Spouse name: Not on file  ? Number of children: Not on file  ? Years of education: Not on file  ? Highest education level: Not on file  ?Occupational History  ? Not on file  ?Tobacco Use  ? Smoking status: Never  ?  Passive exposure: Never  ? Smokeless tobacco: Never  ?Vaping Use  ? Vaping Use: Unknown  ?Substance and Sexual Activity  ? Alcohol use: Not Currently  ? Drug use: Not Currently  ? Sexual activity: Not Currently  ?Other Topics Concern  ? Not on file  ?Social History Narrative  ? Not on file  ? ?Social Determinants of Health  ? ?Financial Resource Strain: Not on file   ?Food Insecurity: Not on file  ?Transportation Needs: Not on file  ?Physical Activity: Not on file  ?Stress: Not on file  ?Social Connections: Not on file  ? ?Additional Social History:  ?  ?  ?  ?  ?  ?  ?  ?  ?  ?  ?  ? ?Sleep: Fair ? ?Appetite:  Fair ? ?Current Medications: ?Current Facility-Administered Medications  ?Medication Dose Route Frequency Provider Last Rate Last Admin  ? acetaminophen (TYLENOL) tablet 650 mg  650 mg Oral Q6H PRN Lavine Hargrove T, MD      ? alum & mag hydroxide-simeth (MAALOX/MYLANTA) 200-200-20 MG/5ML suspension 30 mL  30 mL Oral Q4H PRN Danaysha Kirn, [name], MD   30 mL at 03/26/22 2035  ? aspirin EC tablet 81 mg  81 mg Oral Daily Demondre Aguas, [name], MD   81 mg at 04/07/22 1036  ? atorvastatin (LIPITOR) tablet 20 mg  20 mg Oral Daily Julane Crock T, MD   20 mg at 04/07/22 1035  ? atropine 1 % ophthalmic solution 1 drop  1 drop Sublingual QID [name], MD   1 drop at 04/07/22 1058  ? benztropine (COGENTIN) tablet 0.5 mg  0.5 mg Oral BID Meleane Selinger, [name], MD   0.5 mg at 04/07/22 1035  ? clonazePAM (KLONOPIN) tablet 1 mg  1 mg Oral TID Adyline Huberty, [name], MD   1 mg at 04/07/22 1036  ? cloZAPine (CLOZARIL) tablet 300 mg  300 mg Oral QHS Jaquavious Mercer, [name], MD   300 mg at 04/06/22 2136  ? diphenhydrAMINE (BENADRYL) capsule 50 mg  50 mg Oral Q6H PRN Janaisa Birkland, [name], MD   50 mg at 03/26/22 2035  ? Or  ? diphenhydrAMINE (BENADRYL) injection 50 mg  50 mg Intramuscular Q6H PRN Berthold Glace T, MD      ? gabapentin (NEURONTIN) capsule 300 mg  300 mg Oral TID Liliana Dang T, MD   300 mg at 04/07/22 1035  ? haloperidol (HALDOL) tablet 5 mg  5 mg Oral TID Brenee Gajda, [name], MD   5 mg at 04/07/22 1036  ? haloperidol (HALDOL) tablet 5 mg  5 mg Oral Q6H PRN Jashira Cotugno, [name], MD   5 mg at 03/22/22 1421  ? Or  ? haloperidol lactate (HALDOL) injection 5 mg  5 mg Intramuscular Q6H PRN Lenard Kampf T, MD      ? magnesium hydroxide (MILK OF MAGNESIA) suspension 30 mL  30 mL Oral Daily PRN Kimball Manske T, MD      ?  metoprolol succinate (TOPROL-XL) 24 hr tablet 25 mg  25 mg Oral Daily Peter Keyworth, [name], MD   25 mg at 04/07/22 1036  ? paliperidone (INVEGA SUSTENNA) injection 156 mg  156 mg Intramuscular Q28 days Amonda Brillhart, [name], MD   156 mg at 03/29/22 1445  ? temazepam (RESTORIL) capsule 15 mg  15 mg Oral QHS Emric Kowalewski T, MD   15 mg at 04/06/22 2136  ? ziprasidone (GEODON) injection 20 mg  20 mg Intramuscular Q12H PRN Dempsey Knotek, [name], MD   20 mg at 03/19/22 1551  ? ? ?Lab Results: No results found for this or any previous visit (from the past 48 hour(s)). ? ?Blood Alcohol level:  ?Lab Results  ?Component Value Date  ? ETH <10 07/20/2021  ? ? ?Metabolic Disorder Labs: ?No results found for: HGBA1C, MPG ?No results found for: PROLACTIN ?Lab Results  ?Component Value Date  ? CHOL 167 11/20/2021  ? TRIG 107 11/20/2021  ? HDL 26 (L) 11/20/2021  ? CHOLHDL 6.4 11/20/2021  ? VLDL 21 11/20/2021  ? LDLCALC 120 (H) 11/20/2021  ? ? ?Physical Findings: ?AIMS:  , ,  ,  ,    ?CIWA:    ?COWS:    ? ?Musculoskeletal: ?Strength & Muscle Tone: within normal limits ?Gait & Station: normal ?Patient leans: N/A ? ?Psychiatric Specialty Exam: ? ?Presentation  ?General Appearance: Disheveled ? ?Eye Contact:None ? ?Speech:Clear and Coherent ? ?Speech Volume:Increased ? ?Handedness:Right ? ? ?Mood and Affect  ?Mood:Anxious; Irritable ? ?Affect:Congruent ? ? ?Thought Process  ?Thought Processes:Disorganized ? ?Descriptions of Associations:Loose ? ?Orientation:Partial ? ?Thought Content:Scattered; Illogical ? ?History of Schizophrenia/Schizoaffective disorder:Yes ? ?Duration of Psychotic Symptoms:Greater than six months ? ?Hallucinations:No data recorded ?Ideas of Reference:-- (UTA) ? ?Suicidal Thoughts:No data recorded ?Homicidal Thoughts:No data recorded ? ?Sensorium  ?Memory:Remote Poor; Recent Poor; Immediate Poor; Other (comment) (impaired memory and cognition) ? ?Judgment:Impaired ? ?Insight:Lacking ? ? ?Executive Functions   ?Concentration:Poor ? ?Attention Span:Poor ? ?Recall:Poor ? ?Fund of Knowledge:Poor ? ?Language:Poor ? ? ?Psychomotor Activity  ?Psychomotor Activity:No data recorded ? ?Assets  ?Assets:Housing; Resilience; Social Support; Talents/Skills ? ? ?Sleep  ?Sleep:No data recorded ? ? ?Physical Exam: ?Physical Exam ?Vitals and nursing note reviewed.  ?Constitutional:   ?   Appearance: Normal appearance.  ?HENT:  ?   Head: Normocephalic and atraumatic.  ?   Mouth/Throat:  ?   Pharynx: Oropharynx is clear.  ?Eyes:  ?  Pupils: Pupils are equal, round, and reactive to light.  ?Cardiovascular:  ?   Rate and Rhythm: Normal rate and regular rhythm.  ?Pulmonary:  ?   Effort: Pulmonary effort is normal.  ?   Breath sounds: Normal breath sounds.  ?Abdominal:  ?   General: Abdomen is flat.  ?   Palpations: Abdomen is soft.  ?Musculoskeletal:     ?   General: Normal range of motion.  ?Skin: ?   General: Skin is warm and dry.  ?Neurological:  ?   General: No focal deficit present.  ?   Mental Status: He is alert. Mental status is at baseline.  ?Psychiatric:     ?   Attention and Perception: He is inattentive.     ?   Mood and Affect: Mood normal. Affect is labile.     ?   Speech: He is noncommunicative.  ? ?Review of Systems  ?Unable to perform ROS: Language  ?Blood pressure 108/79, pulse 94, temperature 97.6 ?F (36.4 ?C), resp. rate 17, height 5\' 8"  (1.727 m), weight 85.8 kg, SpO2 100 %. Body mass index is 28.76 kg/m?. ? ? ?Treatment Plan Summary: ?Medication management and Plan no change to medication.  Encouraged patient with his behavior.  Very much appreciate help from speech therapy.  We are still working on possible discharge planning ? ? , MD ?04/07/2022, 2:39 PM ? ?

## 2022-04-07 NOTE — Progress Notes (Signed)
D: Patient alert and oriented. Patient denies pain.Patient denies SI/HI/AVH.  ?Patient did go outside to the courtyard when patient were given the opportunity.  ?Patient occasionally throughout shift sitting in the dayroom with peers. ? ?A: Scheduled medications administered to patient, per MD orders.  Support and encouragement provided to patient.  ?Q15 minute safety checks maintained.  ? ?R: Patient compliant with medication administration and treatment plan. No adverse drug reactions noted. Patient remains safe on the unit at this time.  ?

## 2022-04-07 NOTE — Plan of Care (Signed)
°  Problem: Education: °Goal: Ability to state activities that reduce stress will improve °Outcome: Progressing °  °Problem: Self-Concept: °Goal: Level of anxiety will decrease °Outcome: Progressing °Goal: Ability to modify response to factors that promote anxiety will improve °Outcome: Progressing °  °

## 2022-04-08 DIAGNOSIS — R4689 Other symptoms and signs involving appearance and behavior: Secondary | ICD-10-CM | POA: Diagnosis not present

## 2022-04-08 DIAGNOSIS — S069X0S Unspecified intracranial injury without loss of consciousness, sequela: Secondary | ICD-10-CM | POA: Diagnosis not present

## 2022-04-08 NOTE — Progress Notes (Signed)
14:00 Patient outside in the courtyard. Sitter with patient. ? ?15:00  Patient in dayroom with sitter. Patient appropriate with peers. ? ?16:00  Patient in his room. Says " no no " when encouraged for shower. Sitter with patient. ? ?17:00  Patient had dinner at the dayroom with peers. Compliant with medications. Sitter with patient. ? ?18:00 Patient in his room. Sitter with patient. ?

## 2022-04-08 NOTE — Progress Notes (Signed)
08:00 Patient sleeping. 1:1 sitter with patient. ? ?10:00  Patient compliant with breakfast and medications. No aggressive behaviors noted. Safety maintained with sitter. ? ?12:00 Patient did not eat lunch. Out in the milieu with sitter. Appropriate with staff & peers. ?

## 2022-04-08 NOTE — Progress Notes (Addendum)
Quiet evening no distress noted, he was calm and receptive to staff he took all evening medications he denies SI/HI/AVH, continues to be 1:1 with sitter at bedside, 15 minutes safety checks maintained will continue to monitor closely .  ?

## 2022-04-08 NOTE — Plan of Care (Signed)
Patient pleasant and cooperative most of the shift. Appropriate  with peers in the milieu.No aggressive behaviors noted. Support and encouragement given. ?

## 2022-04-08 NOTE — Group Note (Signed)
[facility] LCSW Group Therapy Note ? ? ?Group Date: 04/08/2022 ?Start Time: 1300 ?End Time: 1400 ? ? ?Type of Therapy/Topic:  Group Therapy:  Balance in Life ? ?Participation Level:  Did Not Attend  ? ?Description of Group:   ? This group will address the concept of balance and how it feels and looks when one is unbalanced. Patients will be encouraged to process areas in their lives that are out of balance, and identify reasons for remaining unbalanced. Facilitators will guide patients utilizing problem- solving interventions to address and correct the stressor making their life unbalanced. Understanding and applying boundaries will be explored and addressed for obtaining  and maintaining a balanced life. Patients will be encouraged to explore ways to assertively make their unbalanced needs known to significant others in their lives, using other group members and facilitator for support and feedback. ? ?Therapeutic Goals: ?Patient will identify two or more emotions or situations they have that consume much of in their lives. ?Patient will identify signs/triggers that life has become out of balance:  ?Patient will identify two ways to set boundaries in order to achieve balance in their lives:  ?Patient will demonstrate ability to communicate their needs through discussion and/or role plays ? ?Summary of Patient Progress: Patient did not attend group despite encouraged participation. ? ? ? ? ? ?Therapeutic Modalities:   ?Cognitive Behavioral Therapy ?Solution-Focused Therapy ?Assertiveness Training ? ? ?Perseus Westall K Siniya Lichty, LCSWA ?

## 2022-04-08 NOTE — Progress Notes (Signed)
[facility] MD Progress Note ? ?04/08/2022 12:40 PM ?Adam [name]  ?MRN:  [id number] ?Subjective: Follow-up with patient with chronic behavior problems from brain injury as well as history of schizophrenia.  Patient seen and chart reviewed.  Patient has no new complaints and is not behaving in any way that is different than his baseline.  Able to come out and interact at times on the unit.  No aggression or violence.  Tolerating medicine well. ?Principal Problem: Difficulty controlling behavior as late effect of traumatic brain injury (HCC) ?Diagnosis: Principal Problem: ?  Difficulty controlling behavior as late effect of traumatic brain injury (HCC) ?Active Problems: ?  Schizophrenia, chronic condition with acute exacerbation (HCC) ?  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 ?Total Time spent with patient: 30 minutes ? ?Past Psychiatric History: Past history of schizophrenia and chronic brain injury ? ?Past Medical History:  ?Past Medical History:  ?Diagnosis Date  ? Myocardial infarction [facility])   ? Schizophrenia (HCC)   ? Stroke [facility])   ? TBI (traumatic brain injury) (HCC)   ? History reviewed. No pertinent surgical history. ?Family History: History reviewed. No pertinent family history. ?Family Psychiatric  History: See previous ?Social History:  ?Social History  ? ?Substance and Sexual Activity  ?Alcohol Use Not Currently  ?   ?Social History  ? ?Substance and Sexual Activity  ?Drug Use Not Currently  ?  ?Social History  ? ?Socioeconomic History  ? Marital status: Single  ?  Spouse name: Not on file  ? Number of children: Not on file  ? Years of education: Not on file  ? Highest education level: Not on file  ?Occupational History  ? Not on file  ?Tobacco Use  ? Smoking status: Never  ?  Passive exposure: Never  ? Smokeless tobacco: Never  ?Vaping Use  ? Vaping Use: Unknown  ?Substance and Sexual Activity  ? Alcohol use: Not Currently  ? Drug use: Not Currently  ? Sexual activity: Not  Currently  ?Other Topics Concern  ? Not on file  ?Social History Narrative  ? Not on file  ? ?Social Determinants of Health  ? ?Financial Resource Strain: Not on file  ?Food Insecurity: Not on file  ?Transportation Needs: Not on file  ?Physical Activity: Not on file  ?Stress: Not on file  ?Social Connections: Not on file  ? ?Additional Social History:  ?  ?  ?  ?  ?  ?  ?  ?  ?  ?  ?  ? ?Sleep: Fair ? ?Appetite:  Fair ? ?Current Medications: ?Current Facility-Administered Medications  ?Medication Dose Route Frequency Provider Last Rate Last Admin  ? acetaminophen (TYLENOL) tablet 650 mg  650 mg Oral Q6H PRN Tayten Heber T, MD      ? alum & mag hydroxide-simeth (MAALOX/MYLANTA) 200-200-20 MG/5ML suspension 30 mL  30 mL Oral Q4H PRN Darryle Dennie, [name], MD   30 mL at 03/26/22 2035  ? aspirin EC tablet 81 mg  81 mg Oral Daily Amario Longmore, [name], MD   81 mg at 04/08/22 0859  ? atorvastatin (LIPITOR) tablet 20 mg  20 mg Oral Daily Kaydin Karbowski T, MD   20 mg at 04/08/22 0859  ? atropine 1 % ophthalmic solution 1 drop  1 drop Sublingual QID [name], MD   1 drop at 04/08/22 1237  ? benztropine (COGENTIN) tablet 0.5 mg  0.5 mg Oral BID Jayd Cadieux, [name], MD   0.5 mg at 04/08/22 0859  ? clonazePAM (  KLONOPIN) tablet 1 mg  1 mg Oral TID Jazalyn Mondor, [name], MD   1 mg at 04/08/22 1237  ? cloZAPine (CLOZARIL) tablet 300 mg  300 mg Oral QHS Cyra Spader, [name], MD   300 mg at 04/07/22 2143  ? diphenhydrAMINE (BENADRYL) capsule 50 mg  50 mg Oral Q6H PRN Nancyjo Givhan, [name], MD   50 mg at 03/26/22 2035  ? Or  ? diphenhydrAMINE (BENADRYL) injection 50 mg  50 mg Intramuscular Q6H PRN Dajanee Voorheis T, MD      ? gabapentin (NEURONTIN) capsule 300 mg  300 mg Oral TID Johnmichael Melhorn T, MD   300 mg at 04/08/22 1237  ? haloperidol (HALDOL) tablet 5 mg  5 mg Oral TID Marcene Laskowski, [name], MD   5 mg at 04/08/22 1237  ? haloperidol (HALDOL) tablet 5 mg  5 mg Oral Q6H PRN Nikan Ellingson, [name], MD   5 mg at 03/22/22 1421  ? Or  ? haloperidol lactate (HALDOL) injection 5  mg  5 mg Intramuscular Q6H PRN Roman Sandall T, MD      ? magnesium hydroxide (MILK OF MAGNESIA) suspension 30 mL  30 mL Oral Daily PRN Gabe Glace T, MD      ? metoprolol succinate (TOPROL-XL) 24 hr tablet 25 mg  25 mg Oral Daily Braydan Marriott, [name], MD   25 mg at 04/08/22 0859  ? paliperidone (INVEGA SUSTENNA) injection 156 mg  156 mg Intramuscular Q28 days Loretha Ure, [name], MD   156 mg at 03/29/22 1445  ? temazepam (RESTORIL) capsule 15 mg  15 mg Oral QHS Darvell Monteforte T, MD   15 mg at 04/07/22 2143  ? ziprasidone (GEODON) injection 20 mg  20 mg Intramuscular Q12H PRN Markala Sitts, [name], MD   20 mg at 03/19/22 1551  ? ? ?Lab Results: No results found for this or any previous visit (from the past 48 hour(s)). ? ?Blood Alcohol level:  ?Lab Results  ?Component Value Date  ? ETH <10 07/20/2021  ? ? ?Metabolic Disorder Labs: ?No results found for: HGBA1C, MPG ?No results found for: PROLACTIN ?Lab Results  ?Component Value Date  ? CHOL 167 11/20/2021  ? TRIG 107 11/20/2021  ? HDL 26 (L) 11/20/2021  ? CHOLHDL 6.4 11/20/2021  ? VLDL 21 11/20/2021  ? LDLCALC 120 (H) 11/20/2021  ? ? ?Physical Findings: ?AIMS:  , ,  ,  ,    ?CIWA:    ?COWS:    ? ?Musculoskeletal: ?Strength & Muscle Tone: within normal limits ?Gait & Station: normal ?Patient leans: N/A ? ?Psychiatric Specialty Exam: ? ?Presentation  ?General Appearance: Disheveled ? ?Eye Contact:None ? ?Speech:Clear and Coherent ? ?Speech Volume:Increased ? ?Handedness:Right ? ? ?Mood and Affect  ?Mood:Anxious; Irritable ? ?Affect:Congruent ? ? ?Thought Process  ?Thought Processes:Disorganized ? ?Descriptions of Associations:Loose ? ?Orientation:Partial ? ?Thought Content:Scattered; Illogical ? ?History of Schizophrenia/Schizoaffective disorder:Yes ? ?Duration of Psychotic Symptoms:Greater than six months ? ?Hallucinations:No data recorded ?Ideas of Reference:-- (UTA) ? ?Suicidal Thoughts:No data recorded ?Homicidal Thoughts:No data recorded ? ?Sensorium  ?Memory:Remote Poor;  Recent Poor; Immediate Poor; Other (comment) (impaired memory and cognition) ? ?Judgment:Impaired ? ?Insight:Lacking ? ? ?Executive Functions  ?Concentration:Poor ? ?Attention Span:Poor ? ?Recall:Poor ? ?Fund of Knowledge:Poor ? ?Language:Poor ? ? ?Psychomotor Activity  ?Psychomotor Activity:No data recorded ? ?Assets  ?Assets:Housing; Resilience; Social Support; Talents/Skills ? ? ?Sleep  ?Sleep:No data recorded ? ? ?Physical Exam: ?Physical Exam ?Vitals and nursing note reviewed.  ?Constitutional:   ?   Appearance: Normal appearance.  ?  HENT:  ?   Head: Normocephalic and atraumatic.  ?   Mouth/Throat:  ?   Pharynx: Oropharynx is clear.  ?Eyes:  ?   Pupils: Pupils are equal, round, and reactive to light.  ?Cardiovascular:  ?   Rate and Rhythm: Normal rate and regular rhythm.  ?Pulmonary:  ?   Effort: Pulmonary effort is normal.  ?   Breath sounds: Normal breath sounds.  ?Abdominal:  ?   General: Abdomen is flat.  ?   Palpations: Abdomen is soft.  ?Musculoskeletal:     ?   General: Normal range of motion.  ?Skin: ?   General: Skin is warm and dry.  ?Neurological:  ?   General: No focal deficit present.  ?   Mental Status: He is alert. Mental status is at baseline.  ?Psychiatric:     ?   Attention and Perception: He is inattentive.     ?   Mood and Affect: Affect is labile.     ?   Speech: He is noncommunicative.  ? ?Review of Systems  ?Unable to perform ROS: Language  ?Blood pressure 108/79, pulse 94, temperature 97.6 ?F (36.4 ?C), resp. rate 17, height 5\' 8"  (1.727 m), weight 85.8 kg, SpO2 100 %. Body mass index is 28.76 kg/m?. ? ? ?Treatment Plan Summary: ?Plan encouragement and friendly support.  No change to medicine.  Follow-up daily while we are continuing to work on discharge planning ? ? , MD ?04/08/2022, 12:40 PM ? ?

## 2022-04-09 DIAGNOSIS — R4689 Other symptoms and signs involving appearance and behavior: Secondary | ICD-10-CM | POA: Diagnosis not present

## 2022-04-09 DIAGNOSIS — S069X0S Unspecified intracranial injury without loss of consciousness, sequela: Secondary | ICD-10-CM | POA: Diagnosis not present

## 2022-04-09 NOTE — Plan of Care (Addendum)
Pt denies depression, anxiety, SI, HI and AVH. Pt is now med compliant, calm and cooperative. Pt was educated and encouraged on care plan and verbalizes understanding. Torrie Mayers RN ?Problem: Safety: ?Goal: Violent Restraint(s) ?Outcome: Progressing ?  ?Problem: Education: ?Goal: Ability to state activities that reduce stress will improve ?Outcome: Progressing ?  ?Problem: Coping: ?Goal: Ability to identify and develop effective coping behavior will improve ?Outcome: Progressing ?  ?Problem: Self-Concept: ?Goal: Ability to identify factors that promote anxiety will improve ?Outcome: Progressing ?Goal: Level of anxiety will decrease ?Outcome: Progressing ?Goal: Ability to modify response to factors that promote anxiety will improve ?Outcome: Progressing ?  ?

## 2022-04-09 NOTE — Progress Notes (Signed)
Pt denies depression, anxiety, and SI/ HI. Pt has come off the back hall and walking around. Torrie Mayers RN ?

## 2022-04-09 NOTE — TOC Progression Note (Signed)
Transition of Care (TOC) - Progression Note  ? ? ?Patient Details  ?Name: Adam [name] ?MRN: [id number] ?Date of Birth: [birth year] ? ?Transition of Care (TOC) CM/SW Contact  ?Adam Harrison Cellar, RN ?Phone Number: ?04/09/2022, 12:06 PM ? ?Clinical Narrative:    ?Request sent to [facility] team requesting update on dc search as well as clarification [place]. Also requested clarification if [facility] continues to manage the wait list for the [facility] as numerous voicemails have been left with no reply. No update from Toby @ [facility] and phone line remains busy when attempts to call.  ? ? ?  ?  ? ?Expected Discharge Plan and Services ?  ?  ?  ?  ?  ?                ?  ?  ?  ?  ?  ?  ?  ?  ?  ?  ? ? ?Social Determinants of Health (SDOH) Interventions ?  ? ?Readmission Risk Interventions ?   ? View : No data to display.  ?  ?  ?  ? ? ?

## 2022-04-09 NOTE — Progress Notes (Signed)
I introduced myself to the pt.. Pt is eating breakfast in the dayroom next to the sitter. I will attempt to give medications after he is finished. Torrie Mayers RN ?

## 2022-04-09 NOTE — TOC Progression Note (Signed)
Transition of Care (TOC) - Progression Note  ? ? ?Patient Details  ?Name: Adam [name] ?MRN: [id number] ?Date of Birth: 03/23/1961 ? ?Transition of Care (TOC) CM/SW Contact  ?Hamler Cellar, RN ?Phone Number: ?04/09/2022, 4:32 PM ? ?Clinical Narrative:    ?Outreach to [employer] requesting application started for patient at [facility] in [place], [place]. Spoke to staff at [facility] and told application must come from [employer]. TOC unable to move forward with referral without [employer] assistance.  ? ? ?  ?  ? ?Expected Discharge Plan and Services ?  ?  ?  ?  ?  ?                ?  ?  ?  ?  ?  ?  ?  ?  ?  ?  ? ? ?Social Determinants of Health (SDOH) Interventions ?  ? ?Readmission Risk Interventions ?   ? View : No data to display.  ?  ?  ?  ? ? ?

## 2022-04-09 NOTE — Progress Notes (Signed)
[facility] MD Progress Note ? ?04/09/2022 4:29 PM ?Adam [name]  ?MRN:  [id number] ?Subjective: Follow-up patient with brain injury.  No complaints.  No behavior changes.  Nothing new in any of his presentation.  Speech therapy is getting him the iPad which we appreciate greatly ?Principal Problem: Difficulty controlling behavior as late effect of traumatic brain injury (HCC) ?Diagnosis: Principal Problem: ?  Difficulty controlling behavior as late effect of traumatic brain injury (HCC) ?Active Problems: ?  Schizophrenia, chronic condition with acute exacerbation (HCC) ?  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 ?Total Time spent with patient: 30 minutes ? ?Past Psychiatric History: Past history of schizophrenia ? ?Past Medical History:  ?Past Medical History:  ?Diagnosis Date  ? Myocardial infarction [facility])   ? Schizophrenia (HCC)   ? Stroke [facility])   ? TBI (traumatic brain injury) (HCC)   ? History reviewed. No pertinent surgical history. ?Family History: History reviewed. No pertinent family history. ?Family Psychiatric  History: See previous ?Social History:  ?Social History  ? ?Substance and Sexual Activity  ?Alcohol Use Not Currently  ?   ?Social History  ? ?Substance and Sexual Activity  ?Drug Use Not Currently  ?  ?Social History  ? ?Socioeconomic History  ? Marital status: Single  ?  Spouse name: Not on file  ? Number of children: Not on file  ? Years of education: Not on file  ? Highest education level: Not on file  ?Occupational History  ? Not on file  ?Tobacco Use  ? Smoking status: Never  ?  Passive exposure: Never  ? Smokeless tobacco: Never  ?Vaping Use  ? Vaping Use: Unknown  ?Substance and Sexual Activity  ? Alcohol use: Not Currently  ? Drug use: Not Currently  ? Sexual activity: Not Currently  ?Other Topics Concern  ? Not on file  ?Social History Narrative  ? Not on file  ? ?Social Determinants of Health  ? ?Financial Resource Strain: Not on file  ?Food Insecurity: Not on file   ?Transportation Needs: Not on file  ?Physical Activity: Not on file  ?Stress: Not on file  ?Social Connections: Not on file  ? ?Additional Social History:  ?  ?  ?  ?  ?  ?  ?  ?  ?  ?  ?  ? ?Sleep: Fair ? ?Appetite:  Fair ? ?Current Medications: ?Current Facility-Administered Medications  ?Medication Dose Route Frequency Provider Last Rate Last Admin  ? acetaminophen (TYLENOL) tablet 650 mg  650 mg Oral Q6H PRN Marlisa Caridi T, MD      ? alum & mag hydroxide-simeth (MAALOX/MYLANTA) 200-200-20 MG/5ML suspension 30 mL  30 mL Oral Q4H PRN Asyah Candler T, MD   30 mL at 04/09/22 0150  ? aspirin EC tablet 81 mg  81 mg Oral Daily Maeryn Mcgath, [name], MD   81 mg at 04/09/22 1054  ? atorvastatin (LIPITOR) tablet 20 mg  20 mg Oral Daily Bravlio Luca T, MD   20 mg at 04/09/22 1054  ? atropine 1 % ophthalmic solution 1 drop  1 drop Sublingual QID [name], MD   1 drop at 04/09/22 1055  ? benztropine (COGENTIN) tablet 0.5 mg  0.5 mg Oral BID Maybel Dambrosio, [name], MD   0.5 mg at 04/09/22 1054  ? clonazePAM (KLONOPIN) tablet 1 mg  1 mg Oral TID Alexea Blase, [name], MD   1 mg at 04/09/22 1053  ? cloZAPine (CLOZARIL) tablet 300 mg  300 mg Oral QHS Catarina Huntley  T, MD   300 mg at 04/08/22 2120  ? diphenhydrAMINE (BENADRYL) capsule 50 mg  50 mg Oral Q6H PRN Morris Longenecker, [name], MD   50 mg at 03/26/22 2035  ? Or  ? diphenhydrAMINE (BENADRYL) injection 50 mg  50 mg Intramuscular Q6H PRN Maie Kesinger T, MD      ? gabapentin (NEURONTIN) capsule 300 mg  300 mg Oral TID Audi Conover T, MD   300 mg at 04/09/22 1055  ? haloperidol (HALDOL) tablet 5 mg  5 mg Oral TID Varonica Siharath, [name], MD   5 mg at 04/09/22 1053  ? haloperidol (HALDOL) tablet 5 mg  5 mg Oral Q6H PRN Angeleah Labrake, [name], MD   5 mg at 04/08/22 2120  ? Or  ? haloperidol lactate (HALDOL) injection 5 mg  5 mg Intramuscular Q6H PRN Erin Uecker T, MD      ? magnesium hydroxide (MILK OF MAGNESIA) suspension 30 mL  30 mL Oral Daily PRN Lynnet Hefley T, MD      ? metoprolol succinate (TOPROL-XL)  24 hr tablet 25 mg  25 mg Oral Daily Gloristine Turrubiates, [name], MD   25 mg at 04/09/22 1053  ? paliperidone (INVEGA SUSTENNA) injection 156 mg  156 mg Intramuscular Q28 days Aidenjames Heckmann, [name], MD   156 mg at 03/29/22 1445  ? temazepam (RESTORIL) capsule 15 mg  15 mg Oral QHS Sholom Dulude T, MD   15 mg at 04/08/22 2120  ? ziprasidone (GEODON) injection 20 mg  20 mg Intramuscular Q12H PRN Heraclio Seidman, [name], MD   20 mg at 03/19/22 1551  ? ? ?Lab Results: No results found for this or any previous visit (from the past 48 hour(s)). ? ?Blood Alcohol level:  ?Lab Results  ?Component Value Date  ? ETH <10 07/20/2021  ? ? ?Metabolic Disorder Labs: ?No results found for: HGBA1C, MPG ?No results found for: PROLACTIN ?Lab Results  ?Component Value Date  ? CHOL 167 11/20/2021  ? TRIG 107 11/20/2021  ? HDL 26 (L) 11/20/2021  ? CHOLHDL 6.4 11/20/2021  ? VLDL 21 11/20/2021  ? LDLCALC 120 (H) 11/20/2021  ? ? ?Physical Findings: ?AIMS:  , ,  ,  ,    ?CIWA:    ?COWS:    ? ?Musculoskeletal: ?Strength & Muscle Tone: within normal limits ?Gait & Station: normal ?Patient leans: N/A ? ?Psychiatric Specialty Exam: ? ?Presentation  ?General Appearance: Disheveled ? ?Eye Contact:None ? ?Speech:Clear and Coherent ? ?Speech Volume:Increased ? ?Handedness:Right ? ? ?Mood and Affect  ?Mood:Anxious; Irritable ? ?Affect:Congruent ? ? ?Thought Process  ?Thought Processes:Disorganized ? ?Descriptions of Associations:Loose ? ?Orientation:Partial ? ?Thought Content:Scattered; Illogical ? ?History of Schizophrenia/Schizoaffective disorder:Yes ? ?Duration of Psychotic Symptoms:Greater than six months ? ?Hallucinations:No data recorded ?Ideas of Reference:-- (UTA) ? ?Suicidal Thoughts:No data recorded ?Homicidal Thoughts:No data recorded ? ?Sensorium  ?Memory:Remote Poor; Recent Poor; Immediate Poor; Other (comment) (impaired memory and cognition) ? ?Judgment:Impaired ? ?Insight:Lacking ? ? ?Executive Functions  ?Concentration:Poor ? ?Attention  Span:Poor ? ?Recall:Poor ? ?Fund of Knowledge:Poor ? ?Language:Poor ? ? ?Psychomotor Activity  ?Psychomotor Activity:No data recorded ? ?Assets  ?Assets:Housing; Resilience; Social Support; Talents/Skills ? ? ?Sleep  ?Sleep:No data recorded ? ? ?Physical Exam: ?Physical Exam ?Vitals reviewed.  ?Constitutional:   ?   Appearance: Normal appearance.  ?HENT:  ?   Head: Normocephalic and atraumatic.  ?   Mouth/Throat:  ?   Pharynx: Oropharynx is clear.  ?Eyes:  ?   Pupils: Pupils are equal, round, and reactive to  light.  ?Cardiovascular:  ?   Rate and Rhythm: Normal rate and regular rhythm.  ?Pulmonary:  ?   Effort: Pulmonary effort is normal.  ?   Breath sounds: Normal breath sounds.  ?Abdominal:  ?   General: Abdomen is flat.  ?   Palpations: Abdomen is soft.  ?Musculoskeletal:     ?   General: Normal range of motion.  ?Skin: ?   General: Skin is warm and dry.  ?Neurological:  ?   General: No focal deficit present.  ?   Mental Status: He is alert. Mental status is at baseline.  ?Psychiatric:     ?   Attention and Perception: He is inattentive.     ?   Mood and Affect: Affect is inappropriate.     ?   Speech: He is noncommunicative.  ? ?Review of Systems  ?Unable to perform ROS: Language  ?Blood pressure 109/81, pulse (!) 118, temperature 97.6 ?F (36.4 ?C), resp. rate 20, height 5\' 8"  (1.727 m), weight 85.8 kg, SpO2 96 %. Body mass index is 28.76 kg/m?. ? ? ?Treatment Plan Summary: ?Plan no change to any medicine.  I was asked to check his hemoglobin A1c and an order has been placed.  Continue to await for placement ? ? , MD ?04/09/2022, 4:29 PM ? ?

## 2022-04-09 NOTE — Progress Notes (Signed)
Pt is sleeping. Pt is safe. Collier [name] RN ?

## 2022-04-09 NOTE — Plan of Care (Signed)
Pt visible on the unit limited interaction with peers and staff. Pt just made noises when asked about SI/HI or AVH. Pt did not answer when I asked him if he had any anxiety or depression. Pt gets overwhelmed and anxious when he meets new people or when he is over stimulated. He refused his lab draw. 1:1 with sitter and he is frequently on his Ipad. He makes loud outburst of noises when he is anxious. He will say no often or repeat certain phrases. ?Problem: Safety: ?Goal: Violent Restraint(s) ?Outcome: Progressing ?  ?Problem: Education: ?Goal: Ability to state activities that reduce stress will improve ?Outcome: Progressing ?  ?Problem: Coping: ?Goal: Ability to identify and develop effective coping behavior will improve ?Outcome: Progressing ?  ?Problem: Self-Concept: ?Goal: Ability to identify factors that promote anxiety will improve ?Outcome: Progressing ?Goal: Level of anxiety will decrease ?Outcome: Progressing ?Goal: Ability to modify response to factors that promote anxiety will improve ?Outcome: Progressing ?  ?

## 2022-04-09 NOTE — Progress Notes (Signed)
Patient alert and oriented x 4, affect is blunted he was restless this evening yelling at staff, eventually he was calm after much redirection and some emotional support. Patient continues to be on 1:1. 15 minutes safety checks maintained will continue to monitor closely.  ?

## 2022-04-09 NOTE — Progress Notes (Signed)
Pt continues to improve the later in the day. Pt is grinning. Pt is safe with a sitter.  ?

## 2022-04-09 NOTE — Progress Notes (Signed)
Pt took meds. He is having some outburst. Pt is safe. Torrie Mayers RN ?

## 2022-04-09 NOTE — Progress Notes (Signed)
Recreation Therapy Notes ? ? ?Date: 04/09/2022 ? ?Time: 10:40 am   ? ?Location: Craft room   ? ?Behavioral response: N/A ?  ?Intervention Topic: Problem Solving  ? ?Discussion/Intervention: ?Patient refused to attend group.  ? ?Clinical Observations/Feedback:  ?Patient refused to attend group.  ?  ?[name] LRT/CTRS ? ? ? ? ? ? ? ? ?[name] ?04/09/2022 12:31 PM ?

## 2022-04-09 NOTE — Progress Notes (Signed)
Pt is sleeping. Pt is safe. Torrie Mayers Rn ?

## 2022-04-09 NOTE — Group Note (Signed)
[facility] LCSW Group Therapy Note ? ? ? ?Group Date: 04/09/2022 ?Start Time: 1300 ?End Time: 1400 ? ?Type of Therapy and Topic:  Group Therapy:  Overcoming Obstacles ? ?Participation Level:  [facility] PARTICIPATION LEVEL: None ? ?Description of Group:   ?In this group patients will be encouraged to explore what they see as obstacles to their own wellness and recovery. They will be guided to discuss their thoughts, feelings, and behaviors related to these obstacles. The group will process together ways to cope with barriers, with attention given to specific choices patients can make. Each patient will be challenged to identify changes they are motivated to make in order to overcome their obstacles. This group will be process-oriented, with patients participating in exploration of their own experiences as well as giving and receiving support and challenge from other group members. ? ?Therapeutic Goals: ?1. Patient will identify personal and current obstacles as they relate to admission. ?2. Patient will identify barriers that currently interfere with their wellness or overcoming obstacles.  ?3. Patient will identify feelings, thought process and behaviors related to these barriers. ?4. Patient will identify two changes they are willing to make to overcome these obstacles:  ? ? ?Summary of Patient Progress ?Patient spent the majority of his time outside of the group room, coming in and out the entire time. ? ? ?Therapeutic Modalities:   ?Cognitive Behavioral Therapy ?Solution Focused Therapy ?Motivational Interviewing ?Relapse Prevention Therapy ? ? ?Glenis Smoker, LCSW ?

## 2022-04-09 NOTE — Progress Notes (Signed)
Pt is safe. His mood is better compared to this morning. Torrie Mayers RN ?

## 2022-04-09 NOTE — Progress Notes (Signed)
Pt is asleep. Pt is safe. Collier [name] RN ?

## 2022-04-09 NOTE — Progress Notes (Signed)
Pt went outside to the courtyard for a little while but came back in. Again in a very pleasant mood. Pt is safe with sitter. Torrie Mayers RN ?

## 2022-04-10 DIAGNOSIS — R4689 Other symptoms and signs involving appearance and behavior: Secondary | ICD-10-CM | POA: Diagnosis not present

## 2022-04-10 DIAGNOSIS — S069X0S Unspecified intracranial injury without loss of consciousness, sequela: Secondary | ICD-10-CM | POA: Diagnosis not present

## 2022-04-10 LAB — HEMOGLOBIN A1C
Hgb A1c MFr Bld: 5.5 % (ref 4.8–5.6)
Mean Plasma Glucose: 111.15 mg/dL

## 2022-04-10 NOTE — Progress Notes (Signed)
Pt visible on the unit with his sitter, in the halls and in the dayroom. Pt get anxious and irritable when approach by new people. He denies SI/HI and AVH. He would not respond to most of the questions I asked him. He was focused on his Ipad and phone. Pt's speech was clear and coherent most of the time. When he is frustrated he make loud noises or says No. No". He refused his lab draw. He needed a lot of encouragement to take his HS medications and he did not respond when I asked him if the had a BM. Sitter at bedside and he frequently needs help to communicate his needs.  ?

## 2022-04-10 NOTE — Progress Notes (Addendum)
Speech Language Pathology Treatment:    ?Patient Details ?Name: Adam [name] ?MRN: [id number] ?DOB: 1961-03-06 ?Today's Date: 04/10/2022 ?Time: 1130-1210 ?SLP Time Calculation (min) (ACUTE ONLY): 40 min ? ?Assessment / Plan / Recommendation ?Clinical Impression ? Late entry for treatment session on 04/09/2022.  ? ?Skilled treatment session focused on introducing speech generating device (SGD) to pt. SLP collaborated with consultant at [facility] and were able to locate pt's previous templet that was programmed several years ago.  ? ?When SLP arrived to pt's room, he was listening to music on his cell phone. Pt turned away from therapist and said "NO." SLP attempted to show pt device x 3 and eventually left device with pt's NT to introduce at another time during the day when pt was more receptive.  ? ?Also met with Adam [name] and briefly introduced the device to her. Current device is the ST dept's but [employer] as agreed to send trial device for pt to use in hopes that it will help reduce communication breakdowns.   ?  ?HPI HPI: ST services were consulted to assess pt's ability with possibility of using a picture board or other such device to assist with communication. 61 year old man with chronic impairment from brain injury on top of schizophrenia. Pt initially under observation status in [facility] ED beginning 07/20/2022 with transfer to inpatient status on 11/20/2021 and then pt transferred to behavioral health unit on 03/19/2022. Most recent head CT reveals Chronic areas of encephalomalacia throughout the left cerebral hemisphere unchanged, so there is concern for possible language impairment. ?  ?   ?SLP Plan ?  Continue current plan of care ? ?  ?  ?Recommendations for follow up therapy are one component of a multi-disciplinary discharge planning process, led by the attending physician.  Recommendations may be updated based on patient status, additional functional criteria and insurance authorization. ?   ? ?Recommendations  ?   ?   ?    ?   ? ? ? ? Follow Up Recommendations: Follow physician's recommendations for discharge plan and follow up therapies ?Assistance recommended at discharge: Frequent or constant Supervision/Assistance ?SLP Visit Diagnosis: Aphasia (R47.01) ? ? ? ? ?  ? Adam [name] B. Rutherford Nail, M.S., CCC-SLP, CBIS ?Speech-Language Pathologist ?Rehabilitation Services ?Office [phone] ? ? ? ?Adam [name] Rutherford Nail ? ?04/10/2022, 4:15 PM ?

## 2022-04-10 NOTE — Progress Notes (Signed)
Patient has been frustrated/agitated this morning. He has been walking around, trying to explain what he wants, to this Clinical research associate and other staff. Once staff tell patient that we don't understand what he is saying, he starts to holler "no" and say "oh my God". Patient was given PRN medication for agitation, in which he tolerated it well, without any issues.  ?

## 2022-04-10 NOTE — Progress Notes (Signed)
Patient keeps coming up to the nurses station, with his assigned safety sitter, trying to ask staff for something. Patient is using his hands, making what appears to be a circle and stating "Nyquil" or a word close to it. When staff tells patient that we don't understand what he is saying, he gets upset and says "my God", and walks off.  ?

## 2022-04-10 NOTE — Progress Notes (Signed)
MD informed this Probation officer, via secure chat, to administer patient's scheduled noon Haldol, even though he was given a PRN dose at 10 am. ?

## 2022-04-10 NOTE — Progress Notes (Signed)
Speech Language Pathology Treatment: Cognitive-Linquistic  ?Patient Details ?Name: Adam [name] ?MRN: [id number] ?DOB: December 07, 1960 ?Today's Date: 04/10/2022 ?Time: 1020-1050 ?SLP Time Calculation (min) (ACUTE ONLY): 30 min ? ?Assessment / Plan / Recommendation ?Clinical Impression ? Skilled treatment session focused on using the speech generating device (SGD) for effective communication. Pt's NT introduced the device yesterday and pt has been exploring device since. Pt eager to go into dayroom. He loudly said "no" but the response was out of context for the situation. SLP directed pt to the device. He located pain, touched the icon "back pain" and navigated to pain scale. He pressed "moderate" and immediately said "no" and then selected "severe" to which he said "yes, severe."  ? ?When asked to find the music section, pt located folder. I asked him who he wanted to listen to. He inadvertently selected Madonna when trying to scroll thru choices as he said "no no not her." Pt then selected Candice and he was patient during the several minutes that it took me to connect the URL of her song to the icon. Pt able to demonstrate understanding as he depressed icon again and began listening to the song. He then said "wow, oh wow" pointing to the display of the song.  ? ?I spoke with pt's nurse and requested that in moments when staff are not understanding pt to attempt using device to supplement his verbal/gestural communication. I also explained how pt used device to describe "severe" "back pain." Nurse stated that she was administer 2 Tylenols for pain management.  ? ?  ?HPI HPI: ST services were consulted to assess pt's ability with possibility of using a picture board or other such device to assist with communication. 61 year old man with chronic impairment from brain injury on top of schizophrenia. Pt initially under observation status in [facility] ED beginning 07/20/2022 with transfer to inpatient status on 11/20/2021 and then  pt transferred to behavioral health unit on 03/19/2022. Most recent head CT reveals Chronic areas of encephalomalacia throughout the left cerebral hemisphere unchanged, so there is concern for possible language impairment. ?  ?   ?SLP Plan ? Continue with current plan of care ? ?  ?  ?Recommendations for follow up therapy are one component of a multi-disciplinary discharge planning process, led by the attending physician.  Recommendations may be updated based on patient status, additional functional criteria and insurance authorization. ?  ? ?Recommendations  ?   ?   ?    ?   ? ? ? ? Follow Up Recommendations: Follow physician's recommendations for discharge plan and follow up therapies ?Assistance recommended at discharge: Frequent or constant Supervision/Assistance ?SLP Visit Diagnosis: Aphasia (R47.01) ?Plan: Continue with current plan of care ? ? ? ? ?  ?  ?Casimir Barcellos B. Rutherford Nail, M.S., CCC-SLP, CBIS ?Speech-Language Pathologist ?Rehabilitation Services ?Office [phone] ? ? ?Anvita Hirata Rutherford Nail ? ?04/10/2022, 4:27 PM ?

## 2022-04-10 NOTE — TOC Progression Note (Signed)
Transition of Care (TOC) - Progression Note  ? ? ?Patient Details  ?Name: Adam [name] ?MRN: [id number] ?Date of Birth: 10-13-1961 ? ?Transition of Care (TOC) CM/SW Contact  ?Anselm Pancoast, RN ?Phone Number: ?04/10/2022, 10:18 AM ? ?Clinical Narrative:    ?Received update from [facility] reporting she is meeting with her supervisor today and will ask specifically about [employer]. No other updates.  ? ? ?  ?  ? ?Expected Discharge Plan and Services ?  ?  ?  ?  ?  ?                ?  ?  ?  ?  ?  ?  ?  ?  ?  ?  ? ? ?Social Determinants of Health (SDOH) Interventions ?  ? ?Readmission Risk Interventions ?   ? View : No data to display.  ?  ?  ?  ? ? ?

## 2022-04-10 NOTE — Plan of Care (Signed)
D- Patient alert and oriented to person and situation. Patient presented in a slightly irritable, preoccupied mood on assessment. He was trying to tell staff that he wanted something, and staff couldn't understand what it is that he wanted, so he became frustrated. Patient in childlike and has expressive aphasia, however, he can say a few words and/or repeats words that he hears others say. When this writer asked patient if he was experiencing any signs/symptoms of depression/anxiety, he said "no". Patient does not appear to be in any pain and he is not in any distress. This writer is unable to assess if patient is experiencing any SI/HI/AVH at this time. ? ?A- Scheduled medications administered to patient, per MD orders. Support and encouragement provided.  Routine safety checks conducted every 15 minutes. ? ?R- No adverse drug reactions noted. Patient contracts for safety at this time. Patient compliant with medications and treatment plan. Patient remains safe at this time. ? ?Problem: Safety: ?Goal: Violent Restraint(s) ?Outcome: Not Progressing ?  ?Problem: Education: ?Goal: Ability to state activities that reduce stress will improve ?Outcome: Not Progressing ?  ?Problem: Coping: ?Goal: Ability to identify and develop effective coping behavior will improve ?Outcome: Not Progressing ?  ?Problem: Self-Concept: ?Goal: Ability to identify factors that promote anxiety will improve ?Outcome: Not Progressing ?Goal: Level of anxiety will decrease ?Outcome: Not Progressing ?Goal: Ability to modify response to factors that promote anxiety will improve ?Outcome: Not Progressing ?  ?

## 2022-04-10 NOTE — Progress Notes (Signed)
Patient was banging on the double doors and when this writer asked what was wrong, he stated "stomach pain". PRN Milk of Magnesia was given. Patient tolerated medication administration well, without any issues. ?

## 2022-04-10 NOTE — Progress Notes (Signed)
1:1 Patient Hourly Rounding ? ?0800: Patient is in bed, asleep, with his assigned safety sitter present. ? ?0900: Patient is in the dayroom, with his assigned safety sitter present. ? ?1000: Patient is in the dayroom, taking medication from this Clinical research associate, with his assigned safety sitter present. ? ?1100: Patient is in the hallway, at the nurses station, with his assigned safety sitter present. ? ?1200: Patient is in his room, awake, with his assigned safety sitter present. ? ?1300: Patient is in the dayroom, on the back hall, with his assigned safety sitter present. ? ?1400: Patient is in his room, awake, with his assigned safety sitter present. ? ?1500: Patient is in the hallway, with his assigned safety sitter present. ? ?1600: Patient is in the dayroom, on the back hall, with his assigned safety sitter present. ? ?1700: Patient is in the community room, eating dinner, with his assigned safety sitter present. ? ?1800: Patient is in the dayroom, on the back hall, with his assigned safety sitter present. ? ?1900: Patient is in the dayroom, with other patients, and his assigned safety sitter present. ?

## 2022-04-10 NOTE — Progress Notes (Signed)
Recreation Therapy Notes ? ?Date: 04/10/2022 ?  ?Time: 10:00 am   ?  ?Location: Court yard  ?  ?Behavioral response: N/A ?  ?Intervention Topic: Decision Making   ?  ?Discussion/Intervention: ?Patient refused to attend group.  ?  ?Clinical Observations/Feedback:  ?Patient refused to attend group.  ?  ?Trudie Cervantes LRT/CTRS ? ? ? ? ? ? ? ?Tilman Mcclaren ?04/10/2022 10:18 AM ?

## 2022-04-10 NOTE — Progress Notes (Signed)
[facility] MD Progress Note ? ?04/10/2022 4:29 PM ?Adam [name]  ?MRN:  [id number] ?Subjective: Follow-up for this gentleman with chronic brain injury and history of schizophrenia.  Having a pretty good day overall.  Earlier in the day he was loudly vocalizing out in the hallway and I do not think we ever quite figured out what that was all about.  Not physically aggressive however.  Later in the afternoon he was pleasant and back to his baseline.  No new physical complaints.  Working on Producer, television/film/video to use the tablet communicator ?Principal Problem: Difficulty controlling behavior as late effect of traumatic brain injury (HCC) ?Diagnosis: Principal Problem: ?  Difficulty controlling behavior as late effect of traumatic brain injury (HCC) ?Active Problems: ?  Schizophrenia, chronic condition with acute exacerbation (HCC) ?  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 ?Total Time spent with patient: 30 minutes ? ?Past Psychiatric History: See all previous notes. ? ?Past Medical History:  ?Past Medical History:  ?Diagnosis Date  ? Myocardial infarction [facility])   ? Schizophrenia (HCC)   ? Stroke [facility])   ? TBI (traumatic brain injury) (HCC)   ? History reviewed. No pertinent surgical history. ?Family History: History reviewed. No pertinent family history. ?Family Psychiatric  History: See previous ?Social History:  ?Social History  ? ?Substance and Sexual Activity  ?Alcohol Use Not Currently  ?   ?Social History  ? ?Substance and Sexual Activity  ?Drug Use Not Currently  ?  ?Social History  ? ?Socioeconomic History  ? Marital status: Single  ?  Spouse name: Not on file  ? Number of children: Not on file  ? Years of education: Not on file  ? Highest education level: Not on file  ?Occupational History  ? Not on file  ?Tobacco Use  ? Smoking status: Never  ?  Passive exposure: Never  ? Smokeless tobacco: Never  ?Vaping Use  ? Vaping Use: Unknown  ?Substance and Sexual Activity  ? Alcohol use: Not Currently  ?  Drug use: Not Currently  ? Sexual activity: Not Currently  ?Other Topics Concern  ? Not on file  ?Social History Narrative  ? Not on file  ? ?Social Determinants of Health  ? ?Financial Resource Strain: Not on file  ?Food Insecurity: Not on file  ?Transportation Needs: Not on file  ?Physical Activity: Not on file  ?Stress: Not on file  ?Social Connections: Not on file  ? ?Additional Social History:  ?  ?  ?  ?  ?  ?  ?  ?  ?  ?  ?  ? ?Sleep: Fair ? ?Appetite:  Fair ? ?Current Medications: ?Current Facility-Administered Medications  ?Medication Dose Route Frequency Provider Last Rate Last Admin  ? acetaminophen (TYLENOL) tablet 650 mg  650 mg Oral Q6H PRN Galilee Pierron T, MD      ? alum & mag hydroxide-simeth (MAALOX/MYLANTA) 200-200-20 MG/5ML suspension 30 mL  30 mL Oral Q4H PRN Divit Stipp T, MD   30 mL at 04/09/22 0150  ? aspirin EC tablet 81 mg  81 mg Oral Daily Ilee Randleman, [name], MD   81 mg at 04/10/22 0818  ? atorvastatin (LIPITOR) tablet 20 mg  20 mg Oral Daily Khristen Cheyney T, MD   20 mg at 04/10/22 0818  ? atropine 1 % ophthalmic solution 1 drop  1 drop Sublingual QID [name], MD   1 drop at 04/10/22 1228  ? benztropine (COGENTIN) tablet 0.5 mg  0.5 mg Oral BID Zain Bingman,  [name], MD   0.5 mg at 04/10/22 0818  ? clonazePAM (KLONOPIN) tablet 1 mg  1 mg Oral TID Timberlyn Pickford, [name], MD   1 mg at 04/10/22 1227  ? cloZAPine (CLOZARIL) tablet 300 mg  300 mg Oral QHS Tommie Bohlken, [name], MD   300 mg at 04/09/22 2138  ? diphenhydrAMINE (BENADRYL) capsule 50 mg  50 mg Oral Q6H PRN Aundray Cartlidge, [name], MD   50 mg at 04/10/22 1000  ? Or  ? diphenhydrAMINE (BENADRYL) injection 50 mg  50 mg Intramuscular Q6H PRN Mccartney Brucks T, MD      ? gabapentin (NEURONTIN) capsule 300 mg  300 mg Oral TID Marilea Gwynne T, MD   300 mg at 04/10/22 1227  ? haloperidol (HALDOL) tablet 5 mg  5 mg Oral TID Brycelynn Stampley, [name], MD   5 mg at 04/10/22 1227  ? haloperidol (HALDOL) tablet 5 mg  5 mg Oral Q6H PRN Macklin Jacquin, [name], MD   5 mg at 04/10/22 1000   ? Or  ? haloperidol lactate (HALDOL) injection 5 mg  5 mg Intramuscular Q6H PRN Jaymian Bogart T, MD      ? magnesium hydroxide (MILK OF MAGNESIA) suspension 30 mL  30 mL Oral Daily PRN Norman Bier T, MD      ? metoprolol succinate (TOPROL-XL) 24 hr tablet 25 mg  25 mg Oral Daily Yoshie Kosel, [name], MD   25 mg at 04/10/22 0818  ? paliperidone (INVEGA SUSTENNA) injection 156 mg  156 mg Intramuscular Q28 days Lezlie Ritchey, [name], MD   156 mg at 03/29/22 1445  ? temazepam (RESTORIL) capsule 15 mg  15 mg Oral QHS Latiffany Harwick T, MD   15 mg at 04/09/22 2138  ? ziprasidone (GEODON) injection 20 mg  20 mg Intramuscular Q12H PRN Scotland Korver, [name], MD   20 mg at 03/19/22 1551  ? ? ?Lab Results:  ?Results for orders placed or performed during the hospital encounter of 03/19/22 (from the past 48 hour(s))  ?Hemoglobin A1c     Status: None  ? Collection Time: 04/10/22 11:43 AM  ?Result Value Ref Range  ? Hgb A1c MFr Bld 5.5 4.8 - 5.6 %  ?  Comment: (NOTE) ?Pre diabetes:          5.7%-6.4% ? ?Diabetes:              >6.4% ? ?Glycemic control for   <7.0% ?adults with diabetes ?  ? Mean Plasma Glucose 111.15 mg/dL  ?  Comment: Performed at [facility], [address], [place], [place] [address]  ? ? ?Blood Alcohol level:  ?Lab Results  ?Component Value Date  ? ETH <10 07/20/2021  ? ? ?Metabolic Disorder Labs: ?Lab Results  ?Component Value Date  ? HGBA1C 5.5 04/10/2022  ? MPG 111.15 04/10/2022  ? ?No results found for: PROLACTIN ?Lab Results  ?Component Value Date  ? CHOL 167 11/20/2021  ? TRIG 107 11/20/2021  ? HDL 26 (L) 11/20/2021  ? CHOLHDL 6.4 11/20/2021  ? VLDL 21 11/20/2021  ? LDLCALC 120 (H) 11/20/2021  ? ? ?Physical Findings: ?AIMS:  , ,  ,  ,    ?CIWA:    ?COWS:    ? ?Musculoskeletal: ?Strength & Muscle Tone: within normal limits ?Gait & Station: normal ?Patient leans: N/A ? ?Psychiatric Specialty Exam: ? ?Presentation  ?General Appearance: Disheveled ? ?Eye Contact:None ? ?Speech:Clear and Coherent ? ?Speech  Volume:Increased ? ?Handedness:Right ? ? ?Mood and Affect  ?Mood:Anxious;  Irritable ? ?Affect:Congruent ? ? ?Thought Process  ?Thought Processes:Disorganized ? ?Descriptions of Associations:Loose ? ?Orientation:Partial ? ?Thought Content:Scattered; Illogical ? ?History of Schizophrenia/Schizoaffective disorder:Yes ? ?Duration of Psychotic Symptoms:Greater than six months ? ?Hallucinations:No data recorded ?Ideas of Reference:-- (UTA) ? ?Suicidal Thoughts:No data recorded ?Homicidal Thoughts:No data recorded ? ?Sensorium  ?Memory:Remote Poor; Recent Poor; Immediate Poor; Other (comment) (impaired memory and cognition) ? ?Judgment:Impaired ? ?Insight:Lacking ? ? ?Executive Functions  ?Concentration:Poor ? ?Attention Span:Poor ? ?Recall:Poor ? ?Fund of Knowledge:Poor ? ?Language:Poor ? ? ?Psychomotor Activity  ?Psychomotor Activity:No data recorded ? ?Assets  ?Assets:Housing; Resilience; Social Support; Talents/Skills ? ? ?Sleep  ?Sleep:No data recorded ? ? ?Physical Exam: ?Physical Exam ?Vitals and nursing note reviewed.  ?Constitutional:   ?   Appearance: Normal appearance.  ?HENT:  ?   Head: Normocephalic and atraumatic.  ?   Mouth/Throat:  ?   Pharynx: Oropharynx is clear.  ?Eyes:  ?   Pupils: Pupils are equal, round, and reactive to light.  ?Cardiovascular:  ?   Rate and Rhythm: Normal rate and regular rhythm.  ?Pulmonary:  ?   Effort: Pulmonary effort is normal.  ?   Breath sounds: Normal breath sounds.  ?Abdominal:  ?   General: Abdomen is flat.  ?   Palpations: Abdomen is soft.  ?Musculoskeletal:     ?   General: Normal range of motion.  ?Skin: ?   General: Skin is warm and dry.  ?Neurological:  ?   General: No focal deficit present.  ?   Mental Status: He is alert. Mental status is at baseline.  ?Psychiatric:     ?   Attention and Perception: Attention normal.     ?   Mood and Affect: Mood normal. Affect is labile.     ?   Speech: He is noncommunicative.  ? ?Review of Systems  ?Unable to perform ROS: Language   ?Blood pressure (!) 139/91, pulse 98, temperature (!) 97.5 ?F (36.4 ?C), temperature source Oral, resp. rate 18, height 5\' 8"  (1.727 m), weight 85.8 kg, SpO2 99 %. Body mass index is 28.76 kg/m?. ? ? ?Treatment Pl

## 2022-04-11 DIAGNOSIS — R4689 Other symptoms and signs involving appearance and behavior: Secondary | ICD-10-CM | POA: Diagnosis not present

## 2022-04-11 DIAGNOSIS — S069X0S Unspecified intracranial injury without loss of consciousness, sequela: Secondary | ICD-10-CM | POA: Diagnosis not present

## 2022-04-11 NOTE — Plan of Care (Signed)
Patient pleasant and cooperative most of the shift. Patient communicate with yes or no and small words. Patient repeats words he hears from others. Patient banging on the double door with little agitation.but receptive with redirection. Patient stated that he had a BM this morning.Patient denies SI,HI and AVH. ?

## 2022-04-11 NOTE — Progress Notes (Signed)
Recreation Therapy Notes ?  ?Date: 04/11/2022 ?  ?Time: 10:15 am   ?  ?Location: Courtyard    ?  ?Behavioral response: Appropriate ?  ?Intervention Topic: Leisure   ?  ?Discussion/Intervention:  ?Group content today was focused on leisure. The group defined what leisure is and some positive leisure activities they participate in. Individuals identified the difference between good and bad leisure. Participants expressed how they feel after participating in the leisure of their choice. The group discussed how they go about picking a leisure activity and if others are involved in their leisure activities. The patient stated how many leisure activities they have to choose from and reasons why it is important to have leisure time. Individuals participated in the intervention ?Exploration of Leisure? where they had a chance to identify new leisure activities as well as benefits of leisure. ?Clinical Observations/Feedback: ?Patient came to group and in and out of group due to unknown reasons. Individual was social with peers and staff while participating in the intervention.    ?Amri Lien LRT/CTRS  ? ? ? ? ? ? ? ?Adam [name] ?04/11/2022 12:06 PM ?

## 2022-04-11 NOTE — Progress Notes (Signed)
[facility] MD Progress Note ? ?04/11/2022 5:39 PM ?Adam [name]  ?MRN:  [id number] ?Subjective: Follow-up 61 year old man with chronic brain injury.  No complaint no changes in behavior no new issues today ?Principal Problem: Difficulty controlling behavior as late effect of traumatic brain injury (HCC) ?Diagnosis: Principal Problem: ?  Difficulty controlling behavior as late effect of traumatic brain injury (HCC) ?Active Problems: ?  Schizophrenia, chronic condition with acute exacerbation (HCC) ?  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 ?Total Time spent with patient: 30 minutes ? ?Past Psychiatric History: Past history of schizophrenia compounded by brain injury ? ?Past Medical History:  ?Past Medical History:  ?Diagnosis Date  ? Myocardial infarction [facility])   ? Schizophrenia (HCC)   ? Stroke [facility])   ? TBI (traumatic brain injury) (HCC)   ? History reviewed. No pertinent surgical history. ?Family History: History reviewed. No pertinent family history. ?Family Psychiatric  History: See previous ?Social History:  ?Social History  ? ?Substance and Sexual Activity  ?Alcohol Use Not Currently  ?   ?Social History  ? ?Substance and Sexual Activity  ?Drug Use Not Currently  ?  ?Social History  ? ?Socioeconomic History  ? Marital status: Single  ?  Spouse name: Not on file  ? Number of children: Not on file  ? Years of education: Not on file  ? Highest education level: Not on file  ?Occupational History  ? Not on file  ?Tobacco Use  ? Smoking status: Never  ?  Passive exposure: Never  ? Smokeless tobacco: Never  ?Vaping Use  ? Vaping Use: Unknown  ?Substance and Sexual Activity  ? Alcohol use: Not Currently  ? Drug use: Not Currently  ? Sexual activity: Not Currently  ?Other Topics Concern  ? Not on file  ?Social History Narrative  ? Not on file  ? ?Social Determinants of Health  ? ?Financial Resource Strain: Not on file  ?Food Insecurity: Not on file  ?Transportation Needs: Not on file  ?Physical  Activity: Not on file  ?Stress: Not on file  ?Social Connections: Not on file  ? ?Additional Social History:  ?  ?  ?  ?  ?  ?  ?  ?  ?  ?  ?  ? ?Sleep: Fair ? ?Appetite:  Fair ? ?Current Medications: ?Current Facility-Administered Medications  ?Medication Dose Route Frequency Provider Last Rate Last Admin  ? acetaminophen (TYLENOL) tablet 650 mg  650 mg Oral Q6H PRN Avrey Hyser T, MD      ? alum & mag hydroxide-simeth (MAALOX/MYLANTA) 200-200-20 MG/5ML suspension 30 mL  30 mL Oral Q4H PRN Raquel Sayres T, MD   30 mL at 04/09/22 0150  ? aspirin EC tablet 81 mg  81 mg Oral Daily Rayna Brenner T, MD   81 mg at 04/11/22 0830  ? atorvastatin (LIPITOR) tablet 20 mg  20 mg Oral Daily Holly Pring T, MD   20 mg at 04/11/22 0830  ? atropine 1 % ophthalmic solution 1 drop  1 drop Sublingual QID [name], MD   1 drop at 04/11/22 1651  ? benztropine (COGENTIN) tablet 0.5 mg  0.5 mg Oral BID Olanrewaju Osborn, [name], MD   0.5 mg at 04/11/22 1651  ? clonazePAM (KLONOPIN) tablet 1 mg  1 mg Oral TID Tyronn Golda, [name], MD   1 mg at 04/11/22 1651  ? cloZAPine (CLOZARIL) tablet 300 mg  300 mg Oral QHS Leslee Haueter, [name], MD   300 mg at 04/10/22 2128  ?  diphenhydrAMINE (BENADRYL) capsule 50 mg  50 mg Oral Q6H PRN Lional Icenogle, [name], MD   50 mg at 04/10/22 1000  ? Or  ? diphenhydrAMINE (BENADRYL) injection 50 mg  50 mg Intramuscular Q6H PRN Waverly Tarquinio T, MD      ? gabapentin (NEURONTIN) capsule 300 mg  300 mg Oral TID Ruchi Stoney T, MD   300 mg at 04/11/22 1651  ? haloperidol (HALDOL) tablet 5 mg  5 mg Oral TID Loan Oguin, [name], MD   5 mg at 04/11/22 1651  ? haloperidol (HALDOL) tablet 5 mg  5 mg Oral Q6H PRN Beola Vasallo, [name], MD   5 mg at 04/10/22 1000  ? Or  ? haloperidol lactate (HALDOL) injection 5 mg  5 mg Intramuscular Q6H PRN Chigozie Basaldua T, MD      ? magnesium hydroxide (MILK OF MAGNESIA) suspension 30 mL  30 mL Oral Daily PRN Nykira Reddix, [name], MD   30 mL at 04/10/22 1842  ? metoprolol succinate (TOPROL-XL) 24 hr tablet 25 mg  25 mg  Oral Daily Keng Jewel T, MD   25 mg at 04/11/22 0900  ? paliperidone (INVEGA SUSTENNA) injection 156 mg  156 mg Intramuscular Q28 days Gwynneth Fabio, [name], MD   156 mg at 03/29/22 1445  ? temazepam (RESTORIL) capsule 15 mg  15 mg Oral QHS Rosabell Geyer T, MD   15 mg at 04/10/22 2128  ? ziprasidone (GEODON) injection 20 mg  20 mg Intramuscular Q12H PRN Maame Dack, [name], MD   20 mg at 03/19/22 1551  ? ? ?Lab Results:  ?Results for orders placed or performed during the hospital encounter of 03/19/22 (from the past 48 hour(s))  ?Hemoglobin A1c     Status: None  ? Collection Time: 04/10/22 11:43 AM  ?Result Value Ref Range  ? Hgb A1c MFr Bld 5.5 4.8 - 5.6 %  ?  Comment: (NOTE) ?Pre diabetes:          5.7%-6.4% ? ?Diabetes:              >6.4% ? ?Glycemic control for   <7.0% ?adults with diabetes ?  ? Mean Plasma Glucose 111.15 mg/dL  ?  Comment: Performed at [facility], [address], [place], [place] [address]  ? ? ?Blood Alcohol level:  ?Lab Results  ?Component Value Date  ? ETH <10 07/20/2021  ? ? ?Metabolic Disorder Labs: ?Lab Results  ?Component Value Date  ? HGBA1C 5.5 04/10/2022  ? MPG 111.15 04/10/2022  ? ?No results found for: PROLACTIN ?Lab Results  ?Component Value Date  ? CHOL 167 11/20/2021  ? TRIG 107 11/20/2021  ? HDL 26 (L) 11/20/2021  ? CHOLHDL 6.4 11/20/2021  ? VLDL 21 11/20/2021  ? LDLCALC 120 (H) 11/20/2021  ? ? ?Physical Findings: ?AIMS:  , ,  ,  ,    ?CIWA:    ?COWS:    ? ?Musculoskeletal: ?Strength & Muscle Tone: within normal limits ?Gait & Station: normal ?Patient leans: N/A ? ?Psychiatric Specialty Exam: ? ?Presentation  ?General Appearance: Disheveled ? ?Eye Contact:None ? ?Speech:Clear and Coherent ? ?Speech Volume:Increased ? ?Handedness:Right ? ? ?Mood and Affect  ?Mood:Anxious; Irritable ? ?Affect:Congruent ? ? ?Thought Process  ?Thought Processes:Disorganized ? ?Descriptions of Associations:Loose ? ?Orientation:Partial ? ?Thought Content:Scattered; Illogical ? ?History of  Schizophrenia/Schizoaffective disorder:Yes ? ?Duration of Psychotic Symptoms:Greater than six months ? ?Hallucinations:No data recorded ?Ideas of Reference:-- (UTA) ? ?Suicidal Thoughts:No data recorded ?Homicidal Thoughts:No data recorded ? ?Sensorium  ?Memory:Remote Poor;  Recent Poor; Immediate Poor; Other (comment) (impaired memory and cognition) ? ?Judgment:Impaired ? ?Insight:Lacking ? ? ?Executive Functions  ?Concentration:Poor ? ?Attention Span:Poor ? ?Recall:Poor ? ?Fund of Knowledge:Poor ? ?Language:Poor ? ? ?Psychomotor Activity  ?Psychomotor Activity:No data recorded ? ?Assets  ?Assets:Housing; Resilience; Social Support; Talents/Skills ? ? ?Sleep  ?Sleep:No data recorded ? ? ?Physical Exam: ?Physical Exam ?Vitals and nursing note reviewed.  ?Constitutional:   ?   Appearance: Normal appearance.  ?HENT:  ?   Head: Normocephalic and atraumatic.  ?   Mouth/Throat:  ?   Pharynx: Oropharynx is clear.  ?Eyes:  ?   Pupils: Pupils are equal, round, and reactive to light.  ?Cardiovascular:  ?   Rate and Rhythm: Normal rate and regular rhythm.  ?Pulmonary:  ?   Effort: Pulmonary effort is normal.  ?   Breath sounds: Normal breath sounds.  ?Abdominal:  ?   General: Abdomen is flat.  ?   Palpations: Abdomen is soft.  ?Musculoskeletal:     ?   General: Normal range of motion.  ?Skin: ?   General: Skin is warm and dry.  ?Neurological:  ?   General: No focal deficit present.  ?   Mental Status: He is alert. Mental status is at baseline.  ?Psychiatric:     ?   Attention and Perception: He is inattentive.     ?   Mood and Affect: Mood normal. Affect is labile.     ?   Speech: He is noncommunicative.  ? ?Review of Systems  ?Unable to perform ROS: Language  ?Blood pressure 96/70, pulse (!) 105, temperature (!) 97.5 ?F (36.4 ?C), temperature source Oral, resp. rate 18, height 5\' 8"  (1.727 m), weight 85.8 kg, SpO2 99 %. Body mass index is 28.76 kg/m?. ? ? ?Treatment Plan Summary: ?Medication management and Plan no change to  medication management of her overall treatment plan.  Team on the unit continues to work on ways for him to use the tablet effectively ? ?Mordecai Rasmussen, MD ?04/11/2022, 5:39 PM ? ?

## 2022-04-11 NOTE — Plan of Care (Signed)
°  Problem: Group Participation °Goal: STG - Patient will engage in groups with a calm and appropriate mood at least 2x within 5 recreation therapy group sessions °Description: STG - Patient will engage in groups with a calm and appropriate mood at least 2x within 5 recreation therapy group sessions °Outcome: Progressing °  °

## 2022-04-11 NOTE — Plan of Care (Signed)
?  Problem: Safety: ?Goal: Violent Restraint(s) ?Outcome: Progressing ?  ?Problem: Education: ?Goal: Ability to state activities that reduce stress will improve ?Outcome: Progressing ?  ?Problem: Self-Concept: ?Goal: Level of anxiety will decrease ?Outcome: Progressing ?Goal: Ability to modify response to factors that promote anxiety will improve ?Outcome: Progressing ?  ?

## 2022-04-11 NOTE — Progress Notes (Signed)
Adam [name] has come to the community room. Expressing that he wants some cereal to eat.  He picks out  to bowls of cereal and a carton of milk. Sits down and eats. Remains with sitter for safety and monitoring. ? ? ? ? ? ? ? C. Butler-Nicholson, LPN ?

## 2022-04-11 NOTE — Progress Notes (Signed)
Patient approached nurses station wanting to show staff his new tablet. Shows staff the  pictures of himself and his mother. Points to the block on the tablet that says [facility].  Says. "Adam [name] is going to [employer] very excited about his tablet and eager to show it off to staff. When asked about his day he says yes to an ok day versus good or bad day.  He denies any si hi avh depression and anxiety. When asked if he had any stomach problems or pain anywhere he responded "no". Will continue to monitor with q15 minute safety checks. He continues to have 1:1 sitter for safety and monitoring. ? ? ?C Butler-Nicholson, LPN ?

## 2022-04-11 NOTE — Progress Notes (Addendum)
2000 - Patient in the dayroom with his 1:1 sitter. Patient enjoying the company of his peers with minimal interaction watching TV with them.  ? ? ?2101 - Patient received his qhs medication without incident.  ? ? ?2200 - Patient in room laying in his bed looking at his phone. No distress to note.  ? ? ?0005 - Patient in bed asleep snoring in no distress.  ? ? ? ?Will continue to monitor with q 15 minute safety checks. Remains with 1:1 sitter.  ? ? ? ? ? C Virgilio Belling, LPN ? ? ? ? ?

## 2022-04-11 NOTE — Progress Notes (Signed)
08:00  Patient had breakfast in the day room. Compliant with medications. Sitter with patient. ? ?10:00  Patient outside at the courtyard with peers and sitter. ? ?12 :00 Patient had lunch in the day room. Appropriate with peers. Sitter with patient. ? ?14:00  Patient deleted his pictures and the communication app from the Ipad. Restarted with the help of SLT. ? ?16:00  Patient in & out of his room. Patient fixated on his Ipad. But no irritable behavior noted. Sitter with patient. ?

## 2022-04-12 NOTE — Plan of Care (Signed)
?  Problem: Coping: Goal: Ability to identify and develop effective coping behavior will improve Outcome: Progressing   Problem: Self-Concept: Goal: Ability to modify response to factors that promote anxiety will improve Outcome: Progressing   

## 2022-04-12 NOTE — Progress Notes (Signed)
Patient in bed asleep no sign of distress.  Remains with 1:1 sitter for safety and monitoring.  Will continue to monitoring with q 15 minute safety checks. ? ? ? ? ?C Butler-Nicholson, LPN ? ? ? ? ? ?

## 2022-04-12 NOTE — Progress Notes (Signed)
Patient awoke yelling. When asked what was bothering him patient indicated his head and his stomach was hurting.  Reported having a headache rating at 7/10.  Reported that stomach just hurt without having number rating. Received Tylenol and Mylanta to help with symptoms. Will continue to monitor with q 15 safety checks.  Remains with 1:1 sitter for safety and monitoring. ? ? ? ?C Butler-Nicholson, LPN ?

## 2022-04-12 NOTE — Progress Notes (Deleted)
Recreation Therapy Notes ? ? ?Date: 04/12/2022 ?  ?Time: 10:30 am   ?  ?Location: Court yard  ?  ?Behavioral response: N/A ?  ?Intervention Topic: Social Skills  ?  ?Discussion/Intervention: ?Patient refused to attend group.  ?  ?Clinical Observations/Feedback:  ?Patient refused to attend group.  ?  ?Abdinasir Spadafore LRT/CTRS ?  ?  ? ? ? ? ? ? ? ?Dillyn Menna ?04/12/2022 12:00 PM ?

## 2022-04-12 NOTE — Progress Notes (Signed)
1:1 Patient hourly rounding ? ?0800: Patient is in bedroom, sleeping. Assigned sitter present, with patient. ? ?0900: Patient is in bedroom, sleeping. Assigned sitter present, with patient.  ? ?1000: Patient is in bedroom, sleeping. Assigned sitter present, with patient. ? ?1100: Patient is awake. Patient ate meal in the dayroom. Patient compliant with medication administration. Assigned sitter present, with patient. ? ?1200: Patient is in the dayroom. Assigned sitter is present, with patient. ? ?1300: Patient is in bedroom. Assigned sitter is present, with patient. ? ?1400: Patient is in bedroom. Assigned sitter is present, with patient.  ? ?1500: Patient is in the dayroom. Assigned sitter is present, with patient. ? ?1600: Patient has eaten dinner. Patient is in the dayroom. Assigned sitter is present, with patient. ? ?1700: Patient compliant with medication administration. Patient is in the dayroom. Assigned sitter is present, with patient. ? ?1800: Patient is in the dayroom. Assigned sitter is present, with patient. ? ?1900: Patient is in the dayroom. Assigned sitter is present, with patient. ?

## 2022-04-12 NOTE — Progress Notes (Signed)
Recreation Therapy Notes ? ? ?Date: 04/12/2022 ?  ?Time: 10:30 am   ?  ?Location: Court yard  ?  ?Behavioral response: N/A ?  ?Intervention Topic: Social Skills  ?  ?Discussion/Intervention: ?Patient refused to attend group.  ?  ?Clinical Observations/Feedback:  ?Patient refused to attend group.  ?  ?Brack Shaddock LRT/CTRS ?  ?  ? ? ? ? ? ? ? ?Akyah Lagrange ?04/12/2022 11:56 AM ?

## 2022-04-12 NOTE — Progress Notes (Signed)
[facility] MD Progress Note follow-up patient with brain injury and schizophrenia.  No change in presentation.  No complaint no new behavior issues ? ?04/12/2022 3:24 PM ?Adam [name]  ?MRN:  [id number] ?Subjective: Follow-up patient with brain injury and schizophrenia.  No change in presentation.  No complaint no new behavior issues ?Principal Problem: Difficulty controlling behavior as late effect of traumatic brain injury (HCC) ?Diagnosis: Principal Problem: ?  Difficulty controlling behavior as late effect of traumatic brain injury (HCC) ?Active Problems: ?  Schizophrenia, chronic condition with acute exacerbation (HCC) ?  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 ?Total Time spent with patient: 30 minutes ? ?Past Psychiatric History: Past history of psychotic disorder and brain injury ? ?Past Medical History:  ?Past Medical History:  ?Diagnosis Date  ? Myocardial infarction [facility])   ? Schizophrenia (HCC)   ? Stroke [facility])   ? TBI (traumatic brain injury) (HCC)   ? History reviewed. No pertinent surgical history. ?Family History: History reviewed. No pertinent family history. ?Family Psychiatric  History: See previous ?Social History:  ?Social History  ? ?Substance and Sexual Activity  ?Alcohol Use Not Currently  ?   ?Social History  ? ?Substance and Sexual Activity  ?Drug Use Not Currently  ?  ?Social History  ? ?Socioeconomic History  ? Marital status: Single  ?  Spouse name: Not on file  ? Number of children: Not on file  ? Years of education: Not on file  ? Highest education level: Not on file  ?Occupational History  ? Not on file  ?Tobacco Use  ? Smoking status: Never  ?  Passive exposure: Never  ? Smokeless tobacco: Never  ?Vaping Use  ? Vaping Use: Unknown  ?Substance and Sexual Activity  ? Alcohol use: Not Currently  ? Drug use: Not Currently  ? Sexual activity: Not Currently  ?Other Topics Concern  ? Not on file  ?Social History Narrative  ? Not on file  ? ?Social Determinants of  Health  ? ?Financial Resource Strain: Not on file  ?Food Insecurity: Not on file  ?Transportation Needs: Not on file  ?Physical Activity: Not on file  ?Stress: Not on file  ?Social Connections: Not on file  ? ?Additional Social History:  ?  ?  ?  ?  ?  ?  ?  ?  ?  ?  ?  ? ?Sleep: Fair ? ?Appetite:  Fair ? ?Current Medications: ?Current Facility-Administered Medications  ?Medication Dose Route Frequency Provider Last Rate Last Admin  ? acetaminophen (TYLENOL) tablet 650 mg  650 mg Oral Q6H PRN Janeene Sand, [name], MD   650 mg at 04/12/22 0136  ? alum & mag hydroxide-simeth (MAALOX/MYLANTA) 200-200-20 MG/5ML suspension 30 mL  30 mL Oral Q4H PRN Kariya Lavergne T, MD   30 mL at 04/12/22 0136  ? aspirin EC tablet 81 mg  81 mg Oral Daily Makaylia Hewett, [name], MD   81 mg at 04/12/22 1141  ? atorvastatin (LIPITOR) tablet 20 mg  20 mg Oral Daily Random Dobrowski T, MD   20 mg at 04/12/22 1141  ? atropine 1 % ophthalmic solution 1 drop  1 drop Sublingual QID [name], MD   1 drop at 04/12/22 1140  ? benztropine (COGENTIN) tablet 0.5 mg  0.5 mg Oral BID Mykaylah Ballman T, MD   0.5 mg at 04/12/22 1140  ? clonazePAM (KLONOPIN) tablet 1 mg  1 mg Oral TID Chamara Dyck, [name], MD   1 mg at 04/12/22 1140  ?  cloZAPine (CLOZARIL) tablet 300 mg  300 mg Oral QHS Shakeena Kafer, [name], MD   300 mg at 04/11/22 2102  ? diphenhydrAMINE (BENADRYL) capsule 50 mg  50 mg Oral Q6H PRN Shivank Pinedo, [name], MD   50 mg at 04/10/22 1000  ? Or  ? diphenhydrAMINE (BENADRYL) injection 50 mg  50 mg Intramuscular Q6H PRN Kiefer Opheim T, MD      ? gabapentin (NEURONTIN) capsule 300 mg  300 mg Oral TID Elizabethann Lackey T, MD   300 mg at 04/12/22 1141  ? haloperidol (HALDOL) tablet 5 mg  5 mg Oral TID Tenley Winward, [name], MD   5 mg at 04/12/22 1141  ? haloperidol (HALDOL) tablet 5 mg  5 mg Oral Q6H PRN Montana Bryngelson, [name], MD   5 mg at 04/10/22 1000  ? Or  ? haloperidol lactate (HALDOL) injection 5 mg  5 mg Intramuscular Q6H PRN Corissa Oguinn T, MD      ? magnesium hydroxide (MILK OF  MAGNESIA) suspension 30 mL  30 mL Oral Daily PRN Johnella Crumm, [name], MD   30 mL at 04/10/22 1842  ? metoprolol succinate (TOPROL-XL) 24 hr tablet 25 mg  25 mg Oral Daily Jasmine Maceachern, [name], MD   25 mg at 04/12/22 1141  ? paliperidone (INVEGA SUSTENNA) injection 156 mg  156 mg Intramuscular Q28 days Finbar Nippert, [name], MD   156 mg at 03/29/22 1445  ? temazepam (RESTORIL) capsule 15 mg  15 mg Oral QHS Sherwood Castilla T, MD   15 mg at 04/11/22 2102  ? ziprasidone (GEODON) injection 20 mg  20 mg Intramuscular Q12H PRN Teyah Rossy, [name], MD   20 mg at 03/19/22 1551  ? ? ?Lab Results: No results found for this or any previous visit (from the past 48 hour(s)). ? ?Blood Alcohol level:  ?Lab Results  ?Component Value Date  ? ETH <10 07/20/2021  ? ? ?Metabolic Disorder Labs: ?Lab Results  ?Component Value Date  ? HGBA1C 5.5 04/10/2022  ? MPG 111.15 04/10/2022  ? ?No results found for: PROLACTIN ?Lab Results  ?Component Value Date  ? CHOL 167 11/20/2021  ? TRIG 107 11/20/2021  ? HDL 26 (L) 11/20/2021  ? CHOLHDL 6.4 11/20/2021  ? VLDL 21 11/20/2021  ? LDLCALC 120 (H) 11/20/2021  ? ? ?Physical Findings: ?AIMS:  , ,  ,  ,    ?CIWA:    ?COWS:    ? ?Musculoskeletal: ?Strength & Muscle Tone: within normal limits ?Gait & Station: normal ?Patient leans: N/A ? ?Psychiatric Specialty Exam: ? ?Presentation  ?General Appearance: Disheveled ? ?Eye Contact:None ? ?Speech:Clear and Coherent ? ?Speech Volume:Increased ? ?Handedness:Right ? ? ?Mood and Affect  ?Mood:Anxious; Irritable ? ?Affect:Congruent ? ? ?Thought Process  ?Thought Processes:Disorganized ? ?Descriptions of Associations:Loose ? ?Orientation:Partial ? ?Thought Content:Scattered; Illogical ? ?History of Schizophrenia/Schizoaffective disorder:Yes ? ?Duration of Psychotic Symptoms:Greater than six months ? ?Hallucinations:No data recorded ?Ideas of Reference:-- (UTA) ? ?Suicidal Thoughts:No data recorded ?Homicidal Thoughts:No data recorded ? ?Sensorium  ?Memory:Remote Poor; Recent Poor;  Immediate Poor; Other (comment) (impaired memory and cognition) ? ?Judgment:Impaired ? ?Insight:Lacking ? ? ?Executive Functions  ?Concentration:Poor ? ?Attention Span:Poor ? ?Recall:Poor ? ?Fund of Knowledge:Poor ? ?Language:Poor ? ? ?Psychomotor Activity  ?Psychomotor Activity:No data recorded ? ?Assets  ?Assets:Housing; Resilience; Social Support; Talents/Skills ? ? ?Sleep  ?Sleep:No data recorded ? ? ?Physical Exam: ?Physical Exam ?Vitals and nursing note reviewed.  ?Constitutional:   ?   Appearance: Normal appearance.  ?HENT:  ?   Head:  Normocephalic and atraumatic.  ?   Mouth/Throat:  ?   Pharynx: Oropharynx is clear.  ?Eyes:  ?   Pupils: Pupils are equal, round, and reactive to light.  ?Cardiovascular:  ?   Rate and Rhythm: Normal rate and regular rhythm.  ?Pulmonary:  ?   Effort: Pulmonary effort is normal.  ?   Breath sounds: Normal breath sounds.  ?Abdominal:  ?   General: Abdomen is flat.  ?   Palpations: Abdomen is soft.  ?Musculoskeletal:     ?   General: Normal range of motion.  ?Skin: ?   General: Skin is warm and dry.  ?Neurological:  ?   General: No focal deficit present.  ?   Mental Status: He is alert. Mental status is at baseline.  ?Psychiatric:     ?   Attention and Perception: He is inattentive.     ?   Mood and Affect: Affect is labile and inappropriate.     ?   Speech: He is noncommunicative.  ? ?Review of Systems  ?Unable to perform ROS: Language  ?Neurological: Negative.   ?Blood pressure 96/70, pulse (!) 105, temperature (!) 97.5 ?F (36.4 ?C), temperature source Oral, resp. rate 18, height 5\' 8"  (1.727 m), weight 85.8 kg, SpO2 99 %. Body mass index is 28.76 kg/m?. ? ? ?Treatment Plan Summary: ?Plan no change to treatment plan.  Continue current medicine.  Continue to engage patient in activities as much as reasonably possible.  Still working on discharge plan ? ? , MD ?04/12/2022, 3:24 PM ? ?

## 2022-04-12 NOTE — Progress Notes (Signed)
D: Patient alert and oriented. Patient denies pain. Patient denies SI/HI/AVH. Patient has been pleasant and cooperative during shift.  ?Assigned 1:1 sitter has been present with patient during shift. ? ?A: Scheduled medications administered to patient, per MD orders.  Support and encouragement provided to patient.  ?Q15 minute safety checks maintained.  ? ?R: Patient compliant with medication administration and treatment plan. No adverse drug reactions noted. Patient remains safe on the unit at this time.  ?

## 2022-04-12 NOTE — Progress Notes (Signed)
Patient in bed asleep. Mo signs or symptoms of distress.  Will continue to monitor with q 15 minute safety rounds.  Remains with 1:1 sitter for safety and monitoring. ? ? ? ? ? ?C Butler-Nicholson, LPN ?

## 2022-04-13 LAB — CBC WITH DIFFERENTIAL/PLATELET
Abs Immature Granulocytes: 0.02 10*3/uL (ref 0.00–0.07)
Basophils Absolute: 0.1 10*3/uL (ref 0.0–0.1)
Basophils Relative: 2 %
Eosinophils Absolute: 0 10*3/uL (ref 0.0–0.5)
Eosinophils Relative: 0 %
HCT: 41.2 % (ref 39.0–52.0)
Hemoglobin: 14.1 g/dL (ref 13.0–17.0)
Immature Granulocytes: 0 %
Lymphocytes Relative: 24 %
Lymphs Abs: 1.6 10*3/uL (ref 0.7–4.0)
MCH: 30.1 pg (ref 26.0–34.0)
MCHC: 34.2 g/dL (ref 30.0–36.0)
MCV: 87.8 fL (ref 80.0–100.0)
Monocytes Absolute: 0.7 10*3/uL (ref 0.1–1.0)
Monocytes Relative: 11 %
Neutro Abs: 4.1 10*3/uL (ref 1.7–7.7)
Neutrophils Relative %: 63 %
Platelets: 230 10*3/uL (ref 150–400)
RBC: 4.69 MIL/uL (ref 4.22–5.81)
RDW: 13.2 % (ref 11.5–15.5)
WBC: 6.5 10*3/uL (ref 4.0–10.5)
nRBC: 0 % (ref 0.0–0.2)

## 2022-04-13 NOTE — Progress Notes (Signed)
Patient is pleasant and cooperative.  He remains 1:1 sitter for safety and monitoring. He is med compliant and received his meds without incident.  Will continue to monitor with q 15 minute safety rounds and 1:1 sitter. ? ? ? ? ? ?C Butler-Nicholson, LPN ? ?

## 2022-04-13 NOTE — Progress Notes (Signed)
D: Patient alert and oriented. Patient denies pain. Patient denies anxiety and depression. Patient denies SI/HI/AVH. Patient mood has been labile throughout shift.  ? ?Assigned 1:1 sitter has been present with patient during shift.  ? ?A: Scheduled medications administered to patient, per MD orders.  Support and encouragement provided to patient.  ?Q15 minute safety checks maintained.  ? ?R: Patient compliant with medication administration and treatment plan. No adverse drug reactions noted. Patient remains safe on the unit at this time.  ?

## 2022-04-13 NOTE — Plan of Care (Signed)
?  Problem: Safety: ?Goal: Violent Restraint(s) ?Outcome: Progressing ?  ?Problem: Coping: ?Goal: Ability to identify and develop effective coping behavior will improve ?Outcome: Progressing ?  ?Problem: Self-Concept: ?Goal: Ability to identify factors that promote anxiety will improve ?Outcome: Progressing ?Goal: Level of anxiety will decrease ?Outcome: Progressing ?  ?

## 2022-04-13 NOTE — Progress Notes (Signed)
Recreation Therapy Notes ? ? ?Date: 04/13/2022 ? ?Time: 10:40 am   ? ?Location: Craft room   ? ?Behavioral response: N/A ?  ?Intervention Topic: Stress Management  ? ?Discussion/Intervention: ?Patient refused to attend group.  ? ?Clinical Observations/Feedback:  ?Patient refused to attend group.  ?  ?Jarell Mcewen LRT/CTRS ? ? ? ? ? ? ? ?Mabrey Howland ?04/13/2022 11:28 AM ?

## 2022-04-13 NOTE — Plan of Care (Signed)
?  Problem: Safety: ?Goal: Violent Restraint(s) ?Outcome: Progressing ?  ?Problem: Self-Concept: ?Goal: Level of anxiety will decrease ?Outcome: Progressing ?Goal: Ability to modify response to factors that promote anxiety will improve ?Outcome: Progressing ?  ?

## 2022-04-13 NOTE — Progress Notes (Signed)
2000 - Patient is in dayroom with sitter watching tv and enjoying his peers. Remains with 1:1 sitter. ? ? ?0000 - Patient is in bed asleep. No distress noted.  Remains with 1:1 sitter. ? ? ? ?0400 - Patient is in bed sleep. No distress.  Remains with 1:1 sitter. ? ? ? ? ?C Butler-Nicholson, LPN ?

## 2022-04-13 NOTE — Progress Notes (Signed)
[facility] MD Progress Note ? ?04/13/2022 3:29 PM ?Adam [name]  ?MRN:  [id number] ?Subjective: Follow-up 61 year old man with schizophrenia and brain injury.  No new complaints.  Having a good day today.  No outbursts or agitation.  He is actually I think responding as the techs and sitter is helped him to improve his language a little bit. ?Principal Problem: Difficulty controlling behavior as late effect of traumatic brain injury (HCC) ?Diagnosis: Principal Problem: ?  Difficulty controlling behavior as late effect of traumatic brain injury (HCC) ?Active Problems: ?  Schizophrenia, chronic condition with acute exacerbation (HCC) ?  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 ?Total Time spent with patient: 30 minutes ? ?Past Psychiatric History: Past history of schizophrenia longstanding brain injury ? ?Past Medical History:  ?Past Medical History:  ?Diagnosis Date  ? Myocardial infarction [facility])   ? Schizophrenia (HCC)   ? Stroke [facility])   ? TBI (traumatic brain injury) (HCC)   ? History reviewed. No pertinent surgical history. ?Family History: History reviewed. No pertinent family history. ?Family Psychiatric  History: See previous ?Social History:  ?Social History  ? ?Substance and Sexual Activity  ?Alcohol Use Not Currently  ?   ?Social History  ? ?Substance and Sexual Activity  ?Drug Use Not Currently  ?  ?Social History  ? ?Socioeconomic History  ? Marital status: Single  ?  Spouse name: Not on file  ? Number of children: Not on file  ? Years of education: Not on file  ? Highest education level: Not on file  ?Occupational History  ? Not on file  ?Tobacco Use  ? Smoking status: Never  ?  Passive exposure: Never  ? Smokeless tobacco: Never  ?Vaping Use  ? Vaping Use: Unknown  ?Substance and Sexual Activity  ? Alcohol use: Not Currently  ? Drug use: Not Currently  ? Sexual activity: Not Currently  ?Other Topics Concern  ? Not on file  ?Social History Narrative  ? Not on file  ? ?Social  Determinants of Health  ? ?Financial Resource Strain: Not on file  ?Food Insecurity: Not on file  ?Transportation Needs: Not on file  ?Physical Activity: Not on file  ?Stress: Not on file  ?Social Connections: Not on file  ? ?Additional Social History:  ?  ?  ?  ?  ?  ?  ?  ?  ?  ?  ?  ? ?Sleep: Fair ? ?Appetite:  Fair ? ?Current Medications: ?Current Facility-Administered Medications  ?Medication Dose Route Frequency Provider Last Rate Last Admin  ? acetaminophen (TYLENOL) tablet 650 mg  650 mg Oral Q6H PRN Donnovan Stamour, [name], MD   650 mg at 04/12/22 0136  ? alum & mag hydroxide-simeth (MAALOX/MYLANTA) 200-200-20 MG/5ML suspension 30 mL  30 mL Oral Q4H PRN Teauna Dubach T, MD   30 mL at 04/12/22 0136  ? aspirin EC tablet 81 mg  81 mg Oral Daily Natan Hartog, [name], MD   81 mg at 04/13/22 1226  ? atorvastatin (LIPITOR) tablet 20 mg  20 mg Oral Daily Kemiya Batdorf T, MD   20 mg at 04/13/22 1227  ? atropine 1 % ophthalmic solution 1 drop  1 drop Sublingual QID [name], MD   1 drop at 04/13/22 1227  ? benztropine (COGENTIN) tablet 0.5 mg  0.5 mg Oral BID Kana Reimann T, MD   0.5 mg at 04/13/22 1226  ? clonazePAM (KLONOPIN) tablet 1 mg  1 mg Oral TID Asuna Peth, [name], MD  1 mg at 04/13/22 1227  ? cloZAPine (CLOZARIL) tablet 300 mg  300 mg Oral QHS Sharece Fleischhacker, [name], MD   300 mg at 04/12/22 2141  ? diphenhydrAMINE (BENADRYL) capsule 50 mg  50 mg Oral Q6H PRN Tishie Altmann, [name], MD   50 mg at 04/10/22 1000  ? Or  ? diphenhydrAMINE (BENADRYL) injection 50 mg  50 mg Intramuscular Q6H PRN Tyrena Gohr T, MD      ? gabapentin (NEURONTIN) capsule 300 mg  300 mg Oral TID Joquan Lotz T, MD   300 mg at 04/13/22 1227  ? haloperidol (HALDOL) tablet 5 mg  5 mg Oral TID Autry Prust, [name], MD   5 mg at 04/13/22 1227  ? haloperidol (HALDOL) tablet 5 mg  5 mg Oral Q6H PRN Shantai Tiedeman, [name], MD   5 mg at 04/10/22 1000  ? Or  ? haloperidol lactate (HALDOL) injection 5 mg  5 mg Intramuscular Q6H PRN Kendale Rembold T, MD      ? magnesium hydroxide  (MILK OF MAGNESIA) suspension 30 mL  30 mL Oral Daily PRN Jaelene Garciagarcia, [name], MD   30 mL at 04/10/22 1842  ? metoprolol succinate (TOPROL-XL) 24 hr tablet 25 mg  25 mg Oral Daily Van Ehlert, [name], MD   25 mg at 04/13/22 1226  ? paliperidone (INVEGA SUSTENNA) injection 156 mg  156 mg Intramuscular Q28 days Payden Docter, [name], MD   156 mg at 03/29/22 1445  ? temazepam (RESTORIL) capsule 15 mg  15 mg Oral QHS Ahonesty Woodfin T, MD   15 mg at 04/12/22 2141  ? ziprasidone (GEODON) injection 20 mg  20 mg Intramuscular Q12H PRN Junaid Wurzer, [name], MD   20 mg at 03/19/22 1551  ? ? ?Lab Results: No results found for this or any previous visit (from the past 48 hour(s)). ? ?Blood Alcohol level:  ?Lab Results  ?Component Value Date  ? ETH <10 07/20/2021  ? ? ?Metabolic Disorder Labs: ?Lab Results  ?Component Value Date  ? HGBA1C 5.5 04/10/2022  ? MPG 111.15 04/10/2022  ? ?No results found for: PROLACTIN ?Lab Results  ?Component Value Date  ? CHOL 167 11/20/2021  ? TRIG 107 11/20/2021  ? HDL 26 (L) 11/20/2021  ? CHOLHDL 6.4 11/20/2021  ? VLDL 21 11/20/2021  ? LDLCALC 120 (H) 11/20/2021  ? ? ?Physical Findings: ?AIMS:  , ,  ,  ,    ?CIWA:    ?COWS:    ? ?Musculoskeletal: ?Strength & Muscle Tone: within normal limits ?Gait & Station: normal ?Patient leans: N/A ? ?Psychiatric Specialty Exam: ? ?Presentation  ?General Appearance: Disheveled ? ?Eye Contact:None ? ?Speech:Clear and Coherent ? ?Speech Volume:Increased ? ?Handedness:Right ? ? ?Mood and Affect  ?Mood:Anxious; Irritable ? ?Affect:Congruent ? ? ?Thought Process  ?Thought Processes:Disorganized ? ?Descriptions of Associations:Loose ? ?Orientation:Partial ? ?Thought Content:Scattered; Illogical ? ?History of Schizophrenia/Schizoaffective disorder:Yes ? ?Duration of Psychotic Symptoms:Greater than six months ? ?Hallucinations:No data recorded ?Ideas of Reference:-- (UTA) ? ?Suicidal Thoughts:No data recorded ?Homicidal Thoughts:No data recorded ? ?Sensorium  ?Memory:Remote Poor; Recent  Poor; Immediate Poor; Other (comment) (impaired memory and cognition) ? ?Judgment:Impaired ? ?Insight:Lacking ? ? ?Executive Functions  ?Concentration:Poor ? ?Attention Span:Poor ? ?Recall:Poor ? ?Fund of Knowledge:Poor ? ?Language:Poor ? ? ?Psychomotor Activity  ?Psychomotor Activity:No data recorded ? ?Assets  ?Assets:Housing; Resilience; Social Support; Talents/Skills ? ? ?Sleep  ?Sleep:No data recorded ? ? ?Physical Exam: ?Physical Exam ?Vitals and nursing note reviewed.  ?Constitutional:   ?   Appearance: Normal appearance.  ?  HENT:  ?   Head: Normocephalic and atraumatic.  ?   Mouth/Throat:  ?   Pharynx: Oropharynx is clear.  ?Eyes:  ?   Pupils: Pupils are equal, round, and reactive to light.  ?Cardiovascular:  ?   Rate and Rhythm: Normal rate and regular rhythm.  ?Pulmonary:  ?   Effort: Pulmonary effort is normal.  ?   Breath sounds: Normal breath sounds.  ?Abdominal:  ?   General: Abdomen is flat.  ?   Palpations: Abdomen is soft.  ?Musculoskeletal:     ?   General: Normal range of motion.  ?Skin: ?   General: Skin is warm and dry.  ?Neurological:  ?   General: No focal deficit present.  ?   Mental Status: He is alert. Mental status is at baseline.  ?Psychiatric:     ?   Attention and Perception: He is inattentive.     ?   Mood and Affect: Affect is labile.     ?   Speech: He is noncommunicative.  ? ?Review of Systems  ?Unable to perform ROS: Language  ?Blood pressure 96/70, pulse (!) 105, temperature (!) 97.5 ?F (36.4 ?C), temperature source Oral, resp. rate 18, height 5\' 8"  (1.727 m), weight 85.8 kg, SpO2 99 %. Body mass index is 28.76 kg/m?. ? ? ?Treatment Plan Summary: ?Medication management and Plan continue current medication.  Encouragement and supportive therapy continued to discuss his situation with treatment team daily ? ?Mordecai Rasmussen, MD ?04/13/2022, 3:29 PM ? ?

## 2022-04-13 NOTE — Progress Notes (Signed)
1:1 Patient hourly rounding  0800: Patient is in bed, sleeping. Assigned sitter is present, with patient.  0900: Patient is in bed, sleeping. Assigned sitter is present, with patient.   1000: Patient is in bed, sleeping. Assigned sitter is present, with patient.   1100: Patient is in bed, sleeping. Assigned sitter is present, with patient.  1200: Patient is awake and eating lunch. Patient compliant with medication administration. Assigned sitter is present, with patient.  1300: Patient is in the dayroom. Assigned sitter is present, with patient.  1400: Patient is in bedroom. Assigned sitter is present, with patient.  1500: Patient is in the dayroom. Assigned sitter is present, with patient.  1600: Patient is in bedroom. Assigned sitter is present, with patient.   1700: Patient is in the dayroom. Assigned sitter is present, with patient.  1800: Patient is in the dayroom. Assigned sitter is present, with patient.  1900: Patient is in the dayroom. Assigned sitter is present, with patient.

## 2022-04-13 NOTE — TOC Progression Note (Signed)
Transition of Care (TOC) - Progression Note  ? ? ?Patient Details  ?Name: Adam [name] ?MRN: [id number] ?Date of Birth: January 12, 1961 ? ?Transition of Care (TOC) CM/SW Contact  ?[place] Cellar, RN ?Phone Number: ?04/13/2022, 1:21 PM ? ?Clinical Narrative:    ?Outreach from [employer] requesting TOC presence at joint meeting for patient. Meeting scheduled for Tuesday @ 11:30am.  ? ? ?  ?  ? ?Expected Discharge Plan and Services ?  ?  ?  ?  ?  ?                ?  ?  ?  ?  ?  ?  ?  ?  ?  ?  ? ? ?Social Determinants of Health (SDOH) Interventions ?  ? ?Readmission Risk Interventions ?   ? View : No data to display.  ?  ?  ?  ? ? ?

## 2022-04-14 NOTE — Plan of Care (Signed)
D- Patient alert and oriented to person and situation. Patient presented in a preoccupied, but pleasant mood on assessment. Patient was up earlier than previous days this morning. Patient continues to be fixated on women's panties and bras, by motioning what he wants. Patient is childlike and has expressive aphasia, but he can say a few words and repeats words that he hears others say. Patient also has outburst of saying words, like "Edmon Crape", and has been heard singing throughout the day. Patient does not appear to be in any pain and he is not in any distress. This writer is unable to assess if patient is experiencing any SI/HI/AVH. ? ?A- Scheduled medications administered to patient, per MD orders. Support and encouragement provided.  Routine safety checks conducted every 15 minutes.  Patient informed to notify staff with problems or concerns. ? ?R- No adverse drug reactions noted. Patient contracts for safety at this time. Patient compliant with medications and treatment plan. Patient receptive, calm, and cooperative. Patient interacts well with others on the unit.  Patient remains safe at this time. ? ?Problem: Safety: ?Goal: Violent Restraint(s) ?Outcome: Progressing ?  ?Problem: Education: ?Goal: Ability to state activities that reduce stress will improve ?Outcome: Progressing ?  ?Problem: Coping: ?Goal: Ability to identify and develop effective coping behavior will improve ?Outcome: Progressing ?  ?Problem: Self-Concept: ?Goal: Ability to identify factors that promote anxiety will improve ?Outcome: Progressing ?Goal: Level of anxiety will decrease ?Outcome: Progressing ?Goal: Ability to modify response to factors that promote anxiety will improve ?Outcome: Progressing ?  ?

## 2022-04-14 NOTE — Group Note (Signed)
[facility] LCSW Group Therapy Note ? ? ?Group Date: 04/14/2022 ?Start Time: 1300 ?End Time: 1400 ? ? ?Type of Therapy/Topic:  Group Therapy:  Balance in Life ? ?Participation Level:  Minimal  ? ?Description of Group:   ? This group will address the concept of balance and how it feels and looks when one is unbalanced. Patients will be encouraged to process areas in their lives that are out of balance, and identify reasons for remaining unbalanced. Facilitators will guide patients utilizing problem- solving interventions to address and correct the stressor making their life unbalanced. Understanding and applying boundaries will be explored and addressed for obtaining  and maintaining a balanced life. Patients will be encouraged to explore ways to assertively make their unbalanced needs known to significant others in their lives, using other group members and facilitator for support and feedback. ? ?Therapeutic Goals: ?Patient will identify two or more emotions or situations they have that consume much of in their lives. ?Patient will identify signs/triggers that life has become out of balance:  ?Patient will identify two ways to set boundaries in order to achieve balance in their lives:  ?Patient will demonstrate ability to communicate their needs through discussion and/or role plays ? ?Summary of Patient Progress: Patient presented to group on time; however, patient left group shortly after the icebreaker activity.  ? ? ? ?Therapeutic Modalities:   ?Cognitive Behavioral Therapy ?Solution-Focused Therapy ?Assertiveness Training ? ? ?Ileana Ladd Oswald Pott, LCSWA ?

## 2022-04-14 NOTE — Progress Notes (Signed)
Pharmacy Consult - Clozapine   ?  ?61 yo male ordered clozapine 300 mg PO qHS  ?  ?Clozapine REMS enrollment Verified: yes ?Clozapine REMS enrollment: 01/04/22  ?REMS patient ID: [id number] ?  ?  ?Dose Adjustments This Admission:   ?Decrease dose from 375mg  to 300mg  on  4/10 ?  ?  ?Dose Adjustments This Admission: ?2/3 started on clozapine 25mg   ?2/10 clozapine 100 mg  ?2/20 clozapine 175 mg ?2/22 clozapine 200 mg  ?3/06 clozapine 250 mg ?3/08 clozapine 300 mg ?3/17 clozapine 350 mg ?4/10 clozapine 300 mg ?  ?Labs: ?Date: 01/04/22 ANC: 2800   Submitted: 01/04/22 ?Date: 01/14/22 ANC: 2700   Submitted: 01/14/22 ?Date: 01/22/22 ANC: 4000   Submitted: 01/22/22 ?Date: 01/29/22 ANC: 2500   Submitted: 01/29/22 ?Date: 02/05/22 ANC: 2300   Submitted: 02/05/22 ?Date: 02/13/22 ANC: 2800   Submitted: 02/07/22 ?Date: 02/21/22 ANC: 2300   Submitted: 02/21/22 ?Date: 02/28/22 ANC: 4800 ?Date: 03/07/22 ANC: 7900 ?Date: 03/14/22 ANC: 1700   Submitted as 1200 in error ?Date: 03/15/22 ANC: 1800   Submitted: 03/15/22 ?Date: 03/21/22 ANC: 3900  ?Date: 03/28/22 ANC: 7000  ?04/13/22  ANC:4100  submitted to REMS 04/14/22 ?  ?Plan: ?clozapine 300mg  qHS as previously ordered by Dr 03/30/22 ?Continue weekly ANC  ?  ?**CLOZAPINE MONITORING** ?(reflects NEW REMS GUIDELINES EFFECTIVE 09/13/2014): ?Check ANC at least weekly while inpatient. ?  ?For general population patients, i.e., those without benign ethnic neutropenia (BEN): ?--If ANC 1000-1499, increase ANC monitoring to 3x/wk ?--If ANC < 1000, HOLD CLOZAPINE and get psych consult ?  ?04/16/22 PharmD ?Clinical Pharmacist ?04/14/2022 ? ?

## 2022-04-14 NOTE — Progress Notes (Signed)
Patient has been fixated with women's bras and panties. Patient keeps coming up to the nurses station, motioning at his chest, for a bra. This Clinical research associate gave him a bra and explained to patient that if he loses this one, or throws it away, he would not be given another one. Patient verbalized understanding and, smiled and walked off, back to his room. ?

## 2022-04-14 NOTE — Progress Notes (Signed)
1:1 Patient Hourly Rounding ? ?0800: Patient is in the dayroom, eating breakfast, with his assigned safety sitter present. ? ?0900: Patient is in his room, awake, with his assigned safety sitter present. ? ?1000: Patient is in his room, awake, with his assigned safety sitter present. ? ?1100: Patient is in his room, calm and composed, with his assigned Recruitment consultant. ? ?1200: Patient is in the main dayroom, watching television, with other members on the unit and his assigned safety sitter is present. ? ?1300: Patient is in the dayroom, on the back hall, with his assigned safety sitter present. ? ?1400: Patient continues to be in the dayroom, on the back hall, with his assigned safety sitter present. ? ?1500: Patient is walking in the hallway, to the main dayroom, with his assigned safety sitter present. ? ?1600: Patient is in the hallway, with his assigned Recruitment consultant. ? ?1700: Patient is in his room, awake, with his assigned safety sitter present. ? ?1800: Patient is in the dayroom, on the back hall, with this Clinical research associate as his assigned Recruitment consultant. ? ?1900: Patient is in bed, awake, with this Clinical research associate as his assigned Recruitment consultant. ?

## 2022-04-14 NOTE — Progress Notes (Signed)
[facility] MD Progress Note ? ?04/14/2022 10:16 AM ?Adam [name]  ?MRN:  [id number] ?Subjective:  Patient compliant with medication administration and treatment plan. No adverse drug reactions noted. Patient remains safe on the unit. Patient evaluated in his room today; he keeps pointing to his phone and seems somewhat frustrated.  ? ?Principal Problem: Difficulty controlling behavior as late effect of traumatic brain injury (HCC) ?Diagnosis: Principal Problem: ?  Difficulty controlling behavior as late effect of traumatic brain injury (HCC) ?Active Problems: ?  Schizophrenia, chronic condition with acute exacerbation (HCC) ?  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 ?Total Time spent with patient: 20 minutes ? ?Past Psychiatric History: schizophrenia; longstanding brain injury ?  ? ?Past Medical History:  ?Past Medical History:  ?Diagnosis Date  ? Myocardial infarction [facility])   ? Schizophrenia (HCC)   ? Stroke [facility])   ? TBI (traumatic brain injury) (HCC)   ? History reviewed. No pertinent surgical history. ?Family History: History reviewed. No pertinent family history. ?Family Psychiatric  History:  ?Social History:  ?Social History  ? ?Substance and Sexual Activity  ?Alcohol Use Not Currently  ?   ?Social History  ? ?Substance and Sexual Activity  ?Drug Use Not Currently  ?  ?Social History  ? ?Socioeconomic History  ? Marital status: Single  ?  Spouse name: Not on file  ? Number of children: Not on file  ? Years of education: Not on file  ? Highest education level: Not on file  ?Occupational History  ? Not on file  ?Tobacco Use  ? Smoking status: Never  ?  Passive exposure: Never  ? Smokeless tobacco: Never  ?Vaping Use  ? Vaping Use: Unknown  ?Substance and Sexual Activity  ? Alcohol use: Not Currently  ? Drug use: Not Currently  ? Sexual activity: Not Currently  ?Other Topics Concern  ? Not on file  ?Social History Narrative  ? Not on file  ? ?Social Determinants of Health  ? ?Financial  Resource Strain: Not on file  ?Food Insecurity: Not on file  ?Transportation Needs: Not on file  ?Physical Activity: Not on file  ?Stress: Not on file  ?Social Connections: Not on file  ? ?Additional Social History:  ?  ?  ?  ?  ?  ?  ?  ?  ?  ?  ?  ? ?Sleep: Fair ? ?Appetite:  Fair ? ?Current Medications: ?Current Facility-Administered Medications  ?Medication Dose Route Frequency Provider Last Rate Last Admin  ? acetaminophen (TYLENOL) tablet 650 mg  650 mg Oral Q6H PRN [name], [name], MD   650 mg at 04/12/22 0136  ? alum & mag hydroxide-simeth (MAALOX/MYLANTA) 200-200-20 MG/5ML suspension 30 mL  30 mL Oral Q4H PRN [name], [name], MD   30 mL at 04/12/22 0136  ? aspirin EC tablet 81 mg  81 mg Oral Daily [name], [name], MD   81 mg at 04/14/22 1027  ? atorvastatin (LIPITOR) tablet 20 mg  20 mg Oral Daily [name], [name], MD   20 mg at 04/14/22 2536  ? atropine 1 % ophthalmic solution 1 drop  1 drop Sublingual QID [name], MD   1 drop at 04/14/22 6440  ? benztropine (COGENTIN) tablet 0.5 mg  0.5 mg Oral BID [name], [name], MD   0.5 mg at 04/14/22 3474  ? clonazePAM (KLONOPIN) tablet 1 mg  1 mg Oral TID [name], [name], MD   1 mg at 04/14/22 2595  ? cloZAPine (CLOZARIL)  tablet 300 mg  300 mg Oral QHS [name], [name], MD   300 mg at 04/13/22 2115  ? diphenhydrAMINE (BENADRYL) capsule 50 mg  50 mg Oral Q6H PRN [name], [name], MD   50 mg at 04/10/22 1000  ? Or  ? diphenhydrAMINE (BENADRYL) injection 50 mg  50 mg Intramuscular Q6H PRN [name], [name], MD      ? gabapentin (NEURONTIN) capsule 300 mg  300 mg Oral TID [name], [name], MD   300 mg at 04/14/22 0539  ? haloperidol (HALDOL) tablet 5 mg  5 mg Oral TID [name], [name], MD   5 mg at 04/14/22 7673  ? haloperidol (HALDOL) tablet 5 mg  5 mg Oral Q6H PRN [name], [name], MD   5 mg at 04/10/22 1000  ? Or  ? haloperidol lactate (HALDOL) injection 5 mg  5 mg Intramuscular Q6H PRN [name], [name], MD      ? magnesium hydroxide (MILK OF MAGNESIA) suspension 30 mL   30 mL Oral Daily PRN [name], [name], MD   30 mL at 04/10/22 1842  ? metoprolol succinate (TOPROL-XL) 24 hr tablet 25 mg  25 mg Oral Daily [name], [name], MD   25 mg at 04/14/22 4193  ? paliperidone (INVEGA SUSTENNA) injection 156 mg  156 mg Intramuscular Q28 days [name], [name], MD   156 mg at 03/29/22 1445  ? temazepam (RESTORIL) capsule 15 mg  15 mg Oral QHS [name], [name], MD   15 mg at 04/13/22 2116  ? ziprasidone (GEODON) injection 20 mg  20 mg Intramuscular Q12H PRN [name], [name], MD   20 mg at 03/19/22 1551  ? ? ?Lab Results:  ?Results for orders placed or performed during the hospital encounter of 03/19/22 (from the past 48 hour(s))  ?CBC with Differential/Platelet     Status: None  ? Collection Time: 04/13/22  3:36 PM  ?Result Value Ref Range  ? WBC 6.5 4.0 - 10.5 K/uL  ? RBC 4.69 4.22 - 5.81 MIL/uL  ? Hemoglobin 14.1 13.0 - 17.0 g/dL  ? HCT 41.2 39.0 - 52.0 %  ? MCV 87.8 80.0 - 100.0 fL  ? MCH 30.1 26.0 - 34.0 pg  ? MCHC 34.2 30.0 - 36.0 g/dL  ? RDW 13.2 11.5 - 15.5 %  ? Platelets 230 150 - 400 K/uL  ? nRBC 0.0 0.0 - 0.2 %  ? Neutrophils Relative % 63 %  ? Neutro Abs 4.1 1.7 - 7.7 K/uL  ? Lymphocytes Relative 24 %  ? Lymphs Abs 1.6 0.7 - 4.0 K/uL  ? Monocytes Relative 11 %  ? Monocytes Absolute 0.7 0.1 - 1.0 K/uL  ? Eosinophils Relative 0 %  ? Eosinophils Absolute 0.0 0.0 - 0.5 K/uL  ? Basophils Relative 2 %  ? Basophils Absolute 0.1 0.0 - 0.1 K/uL  ? Immature Granulocytes 0 %  ? Abs Immature Granulocytes 0.02 0.00 - 0.07 K/uL  ?  Comment: Performed at [facility], [address] [place], [place] [address]  ? ? ?Blood Alcohol level:  ?Lab Results  ?Component Value Date  ? ETH <10 07/20/2021  ? ? ?Metabolic Disorder Labs: ?Lab Results  ?Component Value Date  ? HGBA1C 5.5 04/10/2022  ? MPG 111.15 04/10/2022  ? ?No results found for: PROLACTIN ?Lab Results  ?Component Value Date  ? CHOL 167 11/20/2021  ? TRIG 107 11/20/2021  ? HDL 26 (L) 11/20/2021  ? CHOLHDL 6.4 11/20/2021  ? VLDL 21  11/20/2021  ? LDLCALC 120 (H) 11/20/2021  ? ? ?Physical Findings: ?AIMS:  , ,  ,  ,    ?CIWA:    ?COWS:    ? ?Musculoskeletal: ?Strength & Muscle Tone: within normal limits ?Gait & Station: unsteady ?Patient leans: Front ? ?Psychiatric Specialty Exam: ? ?Presentation  ?General Appearance: Disheveled ? ?Eye Contact:None ? ?Speech:Clear and Coherent ? ?Speech Volume:Increased ? ?Handedness:Right ? ? ?Mood and Affect  ?Mood:Anxious; Irritable ? ?Affect:Congruent ? ? ?Thought Process  ?Thought Processes:Disorganized ? ?Descriptions of Associations:Loose ? ?Orientation:Partial ? ?Thought Content:Scattered; Illogical ? ?History of Schizophrenia/Schizoaffective disorder:Yes ? ?Duration of Psychotic Symptoms:Greater than six months ? ?Hallucinations:No data recorded ?Ideas of Reference:-- (UTA) ? ?Suicidal Thoughts:No data recorded ?Homicidal Thoughts:No data recorded ? ?Sensorium  ?Memory:Remote Poor; Recent Poor; Immediate Poor; Other (comment) (impaired memory and cognition) ? ?Judgment:Impaired ? ?Insight:Lacking ? ? ?Executive Functions  ?Concentration:Poor ? ?Attention Span:Poor ? ?Recall:Poor ? ?Fund of Knowledge:Poor ? ?Language:Poor ? ? ?Psychomotor Activity  ?Psychomotor Activity:No data recorded ? ?Assets  ?Assets:Housing; Resilience; Social Support; Talents/Skills ? ? ?Sleep  ?Sleep:No data recorded ? ? ?Physical Exam: ?Physical Exam ?Constitutional:   ?   Appearance: He is normal weight.  ?HENT:  ?   Head: Normocephalic and atraumatic.  ?   Right Ear: Tympanic membrane normal.  ?   Left Ear: Tympanic membrane normal.  ?   Nose: Nose normal.  ?   Mouth/Throat:  ?   Mouth: Mucous membranes are dry.  ?   Pharynx: Oropharynx is clear.  ?Eyes:  ?   Extraocular Movements: Extraocular movements intact.  ?   Conjunctiva/sclera: Conjunctivae normal.  ?   Pupils: Pupils are equal, round, and reactive to light.  ?Cardiovascular:  ?   Rate and Rhythm: Normal rate and regular rhythm.  ?Pulmonary:  ?   Effort: Pulmonary  effort is normal.  ?   Breath sounds: Normal breath sounds.  ?Abdominal:  ?   General: Abdomen is flat. Bowel sounds are normal.  ?   Palpations: Abdomen is soft.  ?Musculoskeletal:  ?   Cervical back: Normal rang

## 2022-04-15 NOTE — Plan of Care (Addendum)
Met with pt in pt room. Denies any SI/ HI / AVH. Currently 1:1. PT continues to sleep for majority of the morning. Staff will continue safety monitoring.  ? ?During safety rounding in patients room. Noted contraband of cell-phone and Chief Executive Officer. Was made aware that patient has special privileges is allowed to have noted contraband. ? ? ?Problem: Safety: ?Goal: Violent Restraint(s) ?Outcome: Not Progressing ?  ?Problem: Education: ?Goal: Ability to state activities that reduce stress will improve ?Outcome: Not Progressing ?  ?Problem: Coping: ?Goal: Ability to identify and develop effective coping behavior will improve ?Outcome: Not Progressing ?  ?

## 2022-04-15 NOTE — Group Note (Signed)
LCSW Group Therapy Note ? ?Group Date: 04/15/2022 ?Start Time: [id number] ?End Time: [id number] ? ? ?Type of Therapy and Topic:  Group Therapy - Healthy vs Unhealthy Coping Skills ? ?Participation Level:  Did Not Attend  ? ?Description of Group ?The focus of this group was to determine what unhealthy coping techniques typically are used by group members and what healthy coping techniques would be helpful in coping with various problems. Patients were guided in becoming aware of the differences between healthy and unhealthy coping techniques. Patients were asked to identify 2-3 healthy coping skills they would like to learn to use more effectively. ? ?Therapeutic Goals ?Patients learned that coping is what human beings do all day long to deal with various situations in their lives ?Patients defined and discussed healthy vs unhealthy coping techniques ?Patients identified their preferred coping techniques and identified whether these were healthy or unhealthy ?Patients determined 2-3 healthy coping skills they would like to become more familiar with and use more often. ?Patients provided support and ideas to each other ? ? ?Summary of Patient Progress:  Due to influx of patients on the units, group was not held.  ? ? ?Therapeutic Modalities ?Cognitive Behavioral Therapy ?Motivational Interviewing ? ?Kenna Gilbert Yeimi Debnam, LCSWA ?04/15/2022  2:55 PM   ?

## 2022-04-15 NOTE — Progress Notes (Signed)
Patient is irritable and angry not receptive to staff, he was noted banging the windows and not receptive to the 1:1 sitter at bedside he was offered emotional support given some night time medication no distress noted.  15 minutes safety checks mainlined will continue to monitor.  ?

## 2022-04-15 NOTE — Progress Notes (Signed)
Patient was cooperative with treatment on shift. He has a Comptroller in hallway for safety and redirection. He was compliant with medication on shift. No new behavioral issues to report on shift at this time. ?

## 2022-04-15 NOTE — Progress Notes (Signed)
[facility] MD Progress Note ? ?04/15/2022 9:23 AM ?Adam [name]  ?MRN:  [id number] ?Subjective:   ?Patient has been fixated with women's bras and panties. Patient keeps coming up to the nurses station, motioning at his chest, for a bra. This Clinical research associate gave him a bra and explained to patient that if he loses this one, or throws it away, he would not be given another one. Patient verbalized understanding and, smiled and walked off, back to his room.  ?  ?  ?  ? ?Patient is irritable and angry not receptive to staff, he was noted banging the windows and not receptive to the 1:1 sitter at bedside he was offered emotional support given some night time medication.  ?Patient has been calmer this morning, woke up briefly for breakfast and meds, yelled incoherently and went back to sleep.  ?Principal Problem: Difficulty controlling behavior as late effect of traumatic brain injury (HCC) ?Diagnosis: Principal Problem: ?  Difficulty controlling behavior as late effect of traumatic brain injury (HCC) ?Active Problems: ?  Schizophrenia, chronic condition with acute exacerbation (HCC) ?  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 ?Total Time spent with patient: 20 minutes ? ?Past Psychiatric History: schizophrenia; longstanding brain injury ? ?Past Medical History:  ?Past Medical History:  ?Diagnosis Date  ? Myocardial infarction [facility])   ? Schizophrenia (HCC)   ? Stroke [facility])   ? TBI (traumatic brain injury) (HCC)   ? History reviewed. No pertinent surgical history. ?Family History: History reviewed. No pertinent family history. ?Family Psychiatric  History:  ?Social History:  ?Social History  ? ?Substance and Sexual Activity  ?Alcohol Use Not Currently  ?   ?Social History  ? ?Substance and Sexual Activity  ?Drug Use Not Currently  ?  ?Social History  ? ?Socioeconomic History  ? Marital status: Single  ?  Spouse name: Not on file  ? Number of children: Not on file  ? Years of education: Not on file  ? Highest  education level: Not on file  ?Occupational History  ? Not on file  ?Tobacco Use  ? Smoking status: Never  ?  Passive exposure: Never  ? Smokeless tobacco: Never  ?Vaping Use  ? Vaping Use: Unknown  ?Substance and Sexual Activity  ? Alcohol use: Not Currently  ? Drug use: Not Currently  ? Sexual activity: Not Currently  ?Other Topics Concern  ? Not on file  ?Social History Narrative  ? Not on file  ? ?Social Determinants of Health  ? ?Financial Resource Strain: Not on file  ?Food Insecurity: Not on file  ?Transportation Needs: Not on file  ?Physical Activity: Not on file  ?Stress: Not on file  ?Social Connections: Not on file  ? ?Additional Social History:  ?  ?  ?  ?  ?  ?  ?  ?  ?  ?  ?  ? ?Sleep: Fair ? ?Appetite:  Fair ? ?Current Medications: ?Current Facility-Administered Medications  ?Medication Dose Route Frequency Provider Last Rate Last Admin  ? acetaminophen (TYLENOL) tablet 650 mg  650 mg Oral Q6H PRN [name], [name], MD   650 mg at 04/12/22 0136  ? alum & mag hydroxide-simeth (MAALOX/MYLANTA) 200-200-20 MG/5ML suspension 30 mL  30 mL Oral Q4H PRN [name], [name], MD   30 mL at 04/12/22 0136  ? aspirin EC tablet 81 mg  81 mg Oral Daily [name], [name], MD   81 mg at 04/15/22 0816  ? atorvastatin (LIPITOR) tablet 20 mg  20 mg Oral Daily [name], [name], MD   20 mg at 04/15/22 0815  ? atropine 1 % ophthalmic solution 1 drop  1 drop Sublingual QID [name], MD   1 drop at 04/15/22 0815  ? benztropine (COGENTIN) tablet 0.5 mg  0.5 mg Oral BID [name], [name], MD   0.5 mg at 04/15/22 0816  ? clonazePAM (KLONOPIN) tablet 1 mg  1 mg Oral TID [name], [name], MD   1 mg at 04/15/22 0816  ? cloZAPine (CLOZARIL) tablet 300 mg  300 mg Oral QHS [name], [name], MD   300 mg at 04/14/22 2102  ? diphenhydrAMINE (BENADRYL) capsule 50 mg  50 mg Oral Q6H PRN [name], [name], MD   50 mg at 04/10/22 1000  ? Or  ? diphenhydrAMINE (BENADRYL) injection 50 mg  50 mg Intramuscular Q6H PRN [name], [name], MD      ? gabapentin  (NEURONTIN) capsule 300 mg  300 mg Oral TID [name], [name], MD   300 mg at 04/15/22 0815  ? haloperidol (HALDOL) tablet 5 mg  5 mg Oral TID [name], [name], MD   5 mg at 04/15/22 0816  ? haloperidol (HALDOL) tablet 5 mg  5 mg Oral Q6H PRN [name], [name], MD   5 mg at 04/14/22 2102  ? Or  ? haloperidol lactate (HALDOL) injection 5 mg  5 mg Intramuscular Q6H PRN [name], [name], MD      ? magnesium hydroxide (MILK OF MAGNESIA) suspension 30 mL  30 mL Oral Daily PRN [name], [name], MD   30 mL at 04/10/22 1842  ? metoprolol succinate (TOPROL-XL) 24 hr tablet 25 mg  25 mg Oral Daily [name], [name], MD   25 mg at 04/15/22 0815  ? paliperidone (INVEGA SUSTENNA) injection 156 mg  156 mg Intramuscular Q28 days [name], [name], MD   156 mg at 03/29/22 1445  ? temazepam (RESTORIL) capsule 15 mg  15 mg Oral QHS [name], [name], MD   15 mg at 04/14/22 2101  ? ziprasidone (GEODON) injection 20 mg  20 mg Intramuscular Q12H PRN [name], [name], MD   20 mg at 03/19/22 1551  ? ? ?Lab Results:  ?Results for orders placed or performed during the hospital encounter of 03/19/22 (from the past 48 hour(s))  ?CBC with Differential/Platelet     Status: None  ? Collection Time: 04/13/22  3:36 PM  ?Result Value Ref Range  ? WBC 6.5 4.0 - 10.5 K/uL  ? RBC 4.69 4.22 - 5.81 MIL/uL  ? Hemoglobin 14.1 13.0 - 17.0 g/dL  ? HCT 41.2 39.0 - 52.0 %  ? MCV 87.8 80.0 - 100.0 fL  ? MCH 30.1 26.0 - 34.0 pg  ? MCHC 34.2 30.0 - 36.0 g/dL  ? RDW 13.2 11.5 - 15.5 %  ? Platelets 230 150 - 400 K/uL  ? nRBC 0.0 0.0 - 0.2 %  ? Neutrophils Relative % 63 %  ? Neutro Abs 4.1 1.7 - 7.7 K/uL  ? Lymphocytes Relative 24 %  ? Lymphs Abs 1.6 0.7 - 4.0 K/uL  ? Monocytes Relative 11 %  ? Monocytes Absolute 0.7 0.1 - 1.0 K/uL  ? Eosinophils Relative 0 %  ? Eosinophils Absolute 0.0 0.0 - 0.5 K/uL  ? Basophils Relative 2 %  ? Basophils Absolute 0.1 0.0 - 0.1 K/uL  ? Immature Granulocytes 0 %  ? Abs Immature Granulocytes 0.02 0.00 - 0.07 K/uL  ?  Comment: Performed  at  [facility], [address], [place], [place] [address]  ? ? ?Blood Alcohol level:  ?Lab Results  ?Component Value Date  ? ETH <10 07/20/2021  ? ? ?Metabolic Disorder Labs: ?Lab Results  ?Component Value Date  ? HGBA1C 5.5 04/10/2022  ? MPG 111.15 04/10/2022  ? ?No results found for: PROLACTIN ?Lab Results  ?Component Value Date  ? CHOL 167 11/20/2021  ? TRIG 107 11/20/2021  ? HDL 26 (L) 11/20/2021  ? CHOLHDL 6.4 11/20/2021  ? VLDL 21 11/20/2021  ? LDLCALC 120 (H) 11/20/2021  ? ? ?Physical Findings: ?AIMS:  , ,  ,  ,    ?CIWA:    ?COWS:    ? ?Musculoskeletal: ?Strength & Muscle Tone: within normal limits ?Gait & Station: normal ?Patient leans: N/A ? ?Psychiatric Specialty Exam: ? ?Presentation  ?General Appearance: Appropriate for Environment; Casual ? ?Eye Contact:Minimal ? ?Speech:Garbled ? ?Speech Volume:Decreased ? ?Handedness:Right ? ? ?Mood and Affect  ?Mood:Anxious; Irritable ? ?Affect:Constricted ? ? ?Thought Process  ?Thought Processes:Irrevelant ? ?Descriptions of Associations:Loose ? ?Orientation:Partial ? ?Thought Content:Perseveration ? ?History of Schizophrenia/Schizoaffective disorder:Yes ? ?Duration of Psychotic Symptoms:Greater than six months ? ?Hallucinations:No data recorded ?Ideas of Reference:Paranoia ? ?Suicidal Thoughts:Suicidal Thoughts: No ? ?Homicidal Thoughts:Homicidal Thoughts: No ? ? ?Sensorium  ?Memory:Immediate Poor; Recent Poor; Remote Poor ? ?Judgment:Impaired ? ?Insight:Lacking ? ? ?Executive Functions  ?Concentration:Poor ? ?Attention Span:Poor ? ?Recall:Poor ? ?Fund of Knowledge:Poor ? ?Language:Poor ? ? ?Psychomotor Activity  ?Psychomotor Activity:No data recorded ? ?Assets  ?Assets:Housing; Resilience; Social Support; Talents/Skills ? ? ?Sleep  ?Sleep:No data recorded ? ? ?Physical Exam: ?Physical Exam ?Constitutional:   ?   Appearance: Normal appearance. He is normal weight.  ?HENT:  ?   Head: Normocephalic and atraumatic.  ?   Right Ear: Tympanic membrane normal.   ?   Left Ear: Tympanic membrane normal.  ?   Nose: Nose normal.  ?   Mouth/Throat:  ?   Mouth: Mucous membranes are moist.  ?   Pharynx: Oropharynx is clear.  ?Eyes:  ?   Extraocular Movements: Extraocular mov

## 2022-04-16 NOTE — Plan of Care (Signed)
?  Problem: Safety: ?Goal: Violent Restraint(s) ?Outcome: Progressing ?  ?Problem: Education: ?Goal: Ability to state activities that reduce stress will improve ?Outcome: Progressing ?  ?Problem: Coping: ?Goal: Ability to identify and develop effective coping behavior will improve ?Outcome: Progressing ?  ?Problem: Self-Concept: ?Goal: Ability to identify factors that promote anxiety will improve ?Outcome: Progressing ?Goal: Level of anxiety will decrease ?Outcome: Progressing ?Goal: Ability to modify response to factors that promote anxiety will improve ?Outcome: Progressing ?  ?

## 2022-04-16 NOTE — Progress Notes (Signed)
Remained asleep entire shift with some occasional yell outs while asleep.  Sitter remains with patient. Patient in no distress at this time. ?

## 2022-04-16 NOTE — Progress Notes (Signed)
Patient appropriate with staff & peers. No irritable behaviors noted. Compliant with meals and medications.Patient does not appear in any distress. Milk of magnesia given as per patient request. Safety maintained with 1:1 sitter. Support and encouragement given. ?

## 2022-04-16 NOTE — Progress Notes (Signed)
At the being of the shift pt up in the dayroom and dining with his sitter. He would not answer when I was asking the assessment questions, but he nodded no. Pt makes noises or yells out to get his needs met at times. He is easy to redirect and he went to his room around 930 pm. 1:1 sitter at bedside. ?

## 2022-04-16 NOTE — Progress Notes (Signed)
Recreation Therapy Notes ? ? ?Date: 04/16/2022 ?  ?Time: 10:50 am ? ?Location: Courtyard    ?  ?Behavioral response: Appropriate ?  ?Intervention Topic: Leisure   ?  ?Discussion/Intervention:  ?Group content today was focused on leisure. The group defined what leisure is and some positive leisure activities they participate in. Individuals identified the difference between good and bad leisure. Participants expressed how they feel after participating in the leisure of their choice. The group discussed how they go about picking a leisure activity and if others are involved in their leisure activities. The patient stated how many leisure activities they have to choose from and reasons why it is important to have leisure time. Individuals participated in the intervention ?Exploration of Leisure? where they had a chance to identify new leisure activities as well as benefits of leisure. ?Clinical Observations/Feedback: ?Patient came to group late and was in and out of group due to unknown reasons. Individual was social with peers and staff while participating in the intervention.    ?Zaden Sako LRT/CTRS  ?  ? ? ? ? ? ? ? ?Lucindia Lemley ?04/16/2022 12:35 PM ?

## 2022-04-16 NOTE — Progress Notes (Signed)
Speech Language Pathology Treatment: Cognitive-Linquistic  ?Patient Details ?Name: Adam [name] ?MRN: [id number] ?DOB: 09-15-1961 ?Today's Date: 04/16/2022 ?Time: 8099-8338 ?SLP Time Calculation (min) (ACUTE ONLY): 40 min ? ?Assessment / Plan / Recommendation ?Clinical Impression ? Pt seen for skilled ST treatment focusing on communication goals. Pt's personal Lingraphica trial device arrived; pt reacted positively upon seeing the device saying "Oh wow." Pt required demonstration/modeling cues to scroll and select icons. Pt typically echoes device language output. Pt selected icon previously created ("Call me Anne" and stated "no"). Modified icon to say "Call me Cornel." 5 minutes later in session, pt asked SLP to call him [name], and indicated he wanted [place] icon replaced on his device. Utilized paper Public affairs consultant (Supported Conversation: Mental Health). SLP facilitated message selection by providing gestural cues and partner-assisted scanning (verbal/gestural). Pt selected "thumbs up" for overall feelings and verbalized "surprised" from feeling choices. Pt was calm throughout 40 minute session while interacting with the device and communication board. Pt verbally agitated when sitter entered room to check on pt, shouting "NO!" SLP modeled how pt can use the device to request: "I want to be alone." After session nursing staff informed SLP pt's loaner tablet being kept in nursing room due to pt agitation (reported due to deleting some icons). Returned to pt's room and locked editing feature on device to prevent icon deletion. Discussed with RN and sitter; requested them to remove device from pt's room if it is observed to generate pt agitation. Paper communication board left with sitter.  ? ?  ?HPI HPI: ST services were consulted to assess pt's ability with possibility of using a picture board or other such device to assist with communication. 61 year old man with chronic impairment from brain injury on top of  schizophrenia. Pt initially under observation status in [facility] ED beginning 07/20/2022 with transfer to inpatient status on 11/20/2021 and then pt transferred to behavioral health unit on 03/19/2022. Most recent head CT reveals Chronic areas of encephalomalacia throughout the left cerebral hemisphere unchanged, so there is concern for possible language impairment. ?  ?   ?SLP Plan ? Continue with current plan of care ? ?  ?  ?Recommendations for follow up therapy are one component of a multi-disciplinary discharge planning process, led by the attending physician.  Recommendations may be updated based on patient status, additional functional criteria and insurance authorization. ?  ? ?Recommendations  ?   ?   ?    ?   ? ? ? ?Adam Baton, MS, CCC-SLP ?Speech-Language Pathologist ?[phone] Plan: Continue with current plan of care ? ? ? ? ?  ?  ? ? ?Adam [name] ? ?04/16/2022, 10:54 AM ?

## 2022-04-16 NOTE — Progress Notes (Signed)
[facility] MD Progress Note ? ?04/16/2022 4:37 PM ?Adam [name]  ?MRN:  [id number] ?Subjective: No change to behavior presentation.  No new symptoms or complaints ?Principal Problem: Difficulty controlling behavior as late effect of traumatic brain injury (HCC) ?Diagnosis: Principal Problem: ?  Difficulty controlling behavior as late effect of traumatic brain injury (HCC) ?Active Problems: ?  Schizophrenia, chronic condition with acute exacerbation (HCC) ?  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 ?Total Time spent with patient: 15 minutes ? ?Past Psychiatric History: History of schizophrenia and traumatic brain injury ? ?Past Medical History:  ?Past Medical History:  ?Diagnosis Date  ? Myocardial infarction [facility])   ? Schizophrenia (HCC)   ? Stroke [facility])   ? TBI (traumatic brain injury) (HCC)   ? History reviewed. No pertinent surgical history. ?Family History: History reviewed. No pertinent family history. ?Family Psychiatric  History: See previous ?Social History:  ?Social History  ? ?Substance and Sexual Activity  ?Alcohol Use Not Currently  ?   ?Social History  ? ?Substance and Sexual Activity  ?Drug Use Not Currently  ?  ?Social History  ? ?Socioeconomic History  ? Marital status: Single  ?  Spouse name: Not on file  ? Number of children: Not on file  ? Years of education: Not on file  ? Highest education level: Not on file  ?Occupational History  ? Not on file  ?Tobacco Use  ? Smoking status: Never  ?  Passive exposure: Never  ? Smokeless tobacco: Never  ?Vaping Use  ? Vaping Use: Unknown  ?Substance and Sexual Activity  ? Alcohol use: Not Currently  ? Drug use: Not Currently  ? Sexual activity: Not Currently  ?Other Topics Concern  ? Not on file  ?Social History Narrative  ? Not on file  ? ?Social Determinants of Health  ? ?Financial Resource Strain: Not on file  ?Food Insecurity: Not on file  ?Transportation Needs: Not on file  ?Physical Activity: Not on file  ?Stress: Not on file   ?Social Connections: Not on file  ? ?Additional Social History:  ?  ?  ?  ?  ?  ?  ?  ?  ?  ?  ?  ? ?Sleep: Fair ? ?Appetite:  Fair ? ?Current Medications: ?Current Facility-Administered Medications  ?Medication Dose Route Frequency Provider Last Rate Last Admin  ? acetaminophen (TYLENOL) tablet 650 mg  650 mg Oral Q6H PRN Jennene Downie, [name], MD   650 mg at 04/12/22 0136  ? alum & mag hydroxide-simeth (MAALOX/MYLANTA) 200-200-20 MG/5ML suspension 30 mL  30 mL Oral Q4H PRN Kortlynn Poust T, MD   30 mL at 04/12/22 0136  ? aspirin EC tablet 81 mg  81 mg Oral Daily Courtnay Petrilla T, MD   81 mg at 04/16/22 0900  ? atorvastatin (LIPITOR) tablet 20 mg  20 mg Oral Daily Kamaury Cutbirth T, MD   20 mg at 04/16/22 0900  ? atropine 1 % ophthalmic solution 1 drop  1 drop Sublingual QID [name], MD   1 drop at 04/16/22 1214  ? benztropine (COGENTIN) tablet 0.5 mg  0.5 mg Oral BID Liesl Simons, [name], MD   0.5 mg at 04/16/22 0947  ? clonazePAM (KLONOPIN) tablet 1 mg  1 mg Oral TID Robertine Kipper, [name], MD   1 mg at 04/16/22 1214  ? cloZAPine (CLOZARIL) tablet 300 mg  300 mg Oral QHS Brian Zeitlin, [name], MD   300 mg at 04/15/22 2118  ? diphenhydrAMINE (BENADRYL) capsule 50  mg  50 mg Oral Q6H PRN Brysan Mcevoy, [name], MD   50 mg at 04/10/22 1000  ? Or  ? diphenhydrAMINE (BENADRYL) injection 50 mg  50 mg Intramuscular Q6H PRN Katherene Dinino T, MD      ? gabapentin (NEURONTIN) capsule 300 mg  300 mg Oral TID Deandre Brannan T, MD   300 mg at 04/16/22 1214  ? haloperidol (HALDOL) tablet 5 mg  5 mg Oral TID Thao Bauza, [name], MD   5 mg at 04/16/22 1214  ? haloperidol (HALDOL) tablet 5 mg  5 mg Oral Q6H PRN Niang Mitcheltree, [name], MD   5 mg at 04/14/22 2102  ? Or  ? haloperidol lactate (HALDOL) injection 5 mg  5 mg Intramuscular Q6H PRN Tessie Ordaz T, MD      ? magnesium hydroxide (MILK OF MAGNESIA) suspension 30 mL  30 mL Oral Daily PRN Calirose Mccance, [name], MD   30 mL at 04/16/22 1607  ? metoprolol succinate (TOPROL-XL) 24 hr tablet 25 mg  25 mg Oral Daily Suvi Archuletta,  [name], MD   25 mg at 04/16/22 0930  ? paliperidone (INVEGA SUSTENNA) injection 156 mg  156 mg Intramuscular Q28 days Zorina Mallin, [name], MD   156 mg at 03/29/22 1445  ? temazepam (RESTORIL) capsule 15 mg  15 mg Oral QHS Talicia Sui T, MD   15 mg at 04/15/22 2118  ? ziprasidone (GEODON) injection 20 mg  20 mg Intramuscular Q12H PRN Willye Javier, [name], MD   20 mg at 03/19/22 1551  ? ? ?Lab Results: No results found for this or any previous visit (from the past 48 hour(s)). ? ?Blood Alcohol level:  ?Lab Results  ?Component Value Date  ? ETH <10 07/20/2021  ? ? ?Metabolic Disorder Labs: ?Lab Results  ?Component Value Date  ? HGBA1C 5.5 04/10/2022  ? MPG 111.15 04/10/2022  ? ?No results found for: PROLACTIN ?Lab Results  ?Component Value Date  ? CHOL 167 11/20/2021  ? TRIG 107 11/20/2021  ? HDL 26 (L) 11/20/2021  ? CHOLHDL 6.4 11/20/2021  ? VLDL 21 11/20/2021  ? LDLCALC 120 (H) 11/20/2021  ? ? ?Physical Findings: ?AIMS:  , ,  ,  ,    ?CIWA:    ?COWS:    ? ?Musculoskeletal: ?Strength & Muscle Tone: within normal limits ?Gait & Station: normal ?Patient leans: N/A ? ?Psychiatric Specialty Exam: ? ?Presentation  ?General Appearance: Disheveled ? ?Eye Contact:Fleeting ? ?Speech:Garbled ? ?Speech Volume:Decreased ? ?Handedness:Right ? ? ?Mood and Affect  ?Mood:Anxious ? ?Affect:Constricted ? ? ?Thought Process  ?Thought Processes:Irrevelant ? ?Descriptions of Associations:Loose ? ?Orientation:None ? ?Thought Content:Illogical ? ?History of Schizophrenia/Schizoaffective disorder:Yes ? ?Duration of Psychotic Symptoms:Greater than six months ? ?Hallucinations:Hallucinations: None ? ?Ideas of Reference:Paranoia ? ?Suicidal Thoughts:Suicidal Thoughts: No ? ?Homicidal Thoughts:Homicidal Thoughts: No ? ? ?Sensorium  ?Memory:Immediate Poor; Remote Poor ? ?Judgment:Impaired ? ?Insight:Lacking ? ? ?Executive Functions  ?Concentration:Poor ? ?Attention Span:Poor ? ?Recall:Poor ? ?Fund of Knowledge:Poor ? ?Language:Poor ? ? ?Psychomotor  Activity  ?Psychomotor Activity:No data recorded ? ?Assets  ?Assets:Housing; Resilience; Social Support; Talents/Skills ? ? ?Sleep  ?Sleep:Sleep: Fair ? ? ? ?Physical Exam: ?Physical Exam ?Vitals and nursing note reviewed.  ?Constitutional:   ?   Appearance: Normal appearance.  ?HENT:  ?   Head: Normocephalic and atraumatic.  ?   Mouth/Throat:  ?   Pharynx: Oropharynx is clear.  ?Eyes:  ?   Pupils: Pupils are equal, round, and reactive to light.  ?Cardiovascular:  ?   Rate  and Rhythm: Normal rate and regular rhythm.  ?Pulmonary:  ?   Effort: Pulmonary effort is normal.  ?   Breath sounds: Normal breath sounds.  ?Abdominal:  ?   General: Abdomen is flat.  ?   Palpations: Abdomen is soft.  ?Musculoskeletal:     ?   General: Normal range of motion.  ?Skin: ?   General: Skin is warm and dry.  ?Neurological:  ?   General: No focal deficit present.  ?   Mental Status: He is alert. Mental status is at baseline.  ?Psychiatric:     ?   Attention and Perception: He is inattentive.     ?   Mood and Affect: Mood normal. Affect is inappropriate.     ?   Speech: He is noncommunicative.  ? ?Review of Systems  ?Unable to perform ROS: Language  ?Blood pressure 115/81, pulse 93, temperature 98.1 ?F (36.7 ?C), temperature source Oral, resp. rate 18, height 5\' 8"  (1.727 m), weight 85.8 kg, SpO2 100 %. Body mass index is 28.76 kg/m?. ? ? ?Treatment Plan Summary: ?Medication management and Plan no change to medication management.  Friendly supportive carrying in counseling ? ? , MD ?04/16/2022, 4:37 PM ? ?

## 2022-04-17 NOTE — Progress Notes (Signed)
1:1 Patient Hourly Rounding ? ?0800: Patient is in bed, asleep, with his assigned safety sitter present. ? ?0900: Patient is still in bed, sleeping, with his assigned safety sitter present. ? ?1000: Patient is asleep, with his assigned safety sitter present. ? ?1100: Patient continues to be in bed, asleep, with his assigned safety sitter present. ? ?1200: Patient is in the dayroom, with his assigned safety sitter present. ? ?1300: Patient is in the dayroom, with his assigned safety sitter present. ? ?1400: Patient is back in bed, sleeping, with his assigned safety sitter present. ? ?1500: Patient is walking in the hallway, with his assigned safety sitter. ? ?1600: Patient is in the hallway, of the back hall, talking with Ivonne Andrew, Charity fundraiser. Patient's assigned safety sitter is present. ? ?1700: Patient is in the dayroom, on the back hall, with his assigned safety sitter present. ? ?1800: Patient is outside, in the courtyard, with other members on the unit and his assigned Recruitment consultant. ? ?1900: Patient is in his room, awake, with his assigned safety sitter present. ?

## 2022-04-17 NOTE — Progress Notes (Signed)
Recreation Therapy Notes ? ?  ?Date: 04/17/2022 ?  ?Time: 9:55 am  ?  ?Location: Courtyard   ?  ?Behavioral response: N/A ?  ?Intervention Topic: Wellness  ?  ?Discussion/Intervention: ?Patient refused to attend group.  ?  ?Clinical Observations/Feedback:  ?Patient refused to attend group.  ?  ?Adam [name] LRT/CTRS ? ? ? ? ? ? ? ?Adam [name] ?04/17/2022 11:17 AM ?

## 2022-04-17 NOTE — Progress Notes (Signed)
Patient is now awake, and eating his breakfast. Patient was given his scheduled morning medication, by this Clinical research associate. Patient tolerated medication well, without any issues. This writer will hold patient's scheduled noon meds, being that the morning dose was given late. MD will be made aware. ?

## 2022-04-17 NOTE — Plan of Care (Signed)
D- Patient alert and oriented to self and situation. Patient slept in majority of the morning and got up just in time for lunch, and presented in a pleasant mood on assessment and had no complaints to voice to this Probation officer. Patient has expressive aphasia and is childlike. Patient does not appear to be in any acute distress. This Probation officer is unable to assess if patient is experiencing any SI/HI/AVH, however, when this Probation officer asked patient about this, he said "no", and continued to eat his food. Patient had no stated goals for today. ? ?A- Scheduled medications administered to patient, per MD orders. Support and encouragement provided.  Routine safety checks conducted every 15 minutes.  Patient informed to notify staff with problems or concerns. ? ?R- No adverse drug reactions noted. Patient contracts for safety at this time. Patient compliant with medications and treatment plan. Patient receptive, calm, and cooperative. Patient interacts well with others on the unit. Patient remains safe at this time. ? ?Problem: Safety: ?Goal: Violent Restraint(s) ?Outcome: Progressing ?  ?Problem: Education: ?Goal: Ability to state activities that reduce stress will improve ?Outcome: Progressing ?  ?Problem: Coping: ?Goal: Ability to identify and develop effective coping behavior will improve ?Outcome: Progressing ?  ?Problem: Self-Concept: ?Goal: Ability to identify factors that promote anxiety will improve ?Outcome: Progressing ?Goal: Level of anxiety will decrease ?Outcome: Progressing ?Goal: Ability to modify response to factors that promote anxiety will improve ?Outcome: Progressing ?  ?

## 2022-04-17 NOTE — Progress Notes (Signed)
Patient is in a pleasant mood this evening.  He has been active on the unit and enjoys hanging out in the dayroom with his peers.  He remains with a 1:1 sitter and at times needs to be redirected, but he has been compliant with his redirection. He is med compliant and received his meds without incident.  Will continue monitor with q15 minute safety rounds and 1:1 sitter. ? ? ? ?C Butler-Nicholson, LPN ?

## 2022-04-17 NOTE — Progress Notes (Signed)
[facility] MD Progress Note ? ?04/17/2022 3:19 PM ?Phillips Climes  ?MRN:  [id number] ?Subjective: Patient seen and chart reviewed.  As usual patient has no complaint.  He is behaving himself well and seems to be making good use of the new tablet ?Principal Problem: Difficulty controlling behavior as late effect of traumatic brain injury ([place]) ?Diagnosis: Principal Problem: ?  Difficulty controlling behavior as late effect of traumatic brain injury ([place]) ?Active Problems: ?  Schizophrenia, chronic condition with acute exacerbation ([place]) ?  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 ?Total Time spent with patient: 30 minutes ? ?Past Psychiatric History: History of schizophrenia and brain injury ? ?Past Medical History:  ?Past Medical History:  ?Diagnosis Date  ? Myocardial infarction [facility])   ? Schizophrenia ([place])   ? Stroke [facility])   ? TBI (traumatic brain injury) ([place])   ? History reviewed. No pertinent surgical history. ?Family History: History reviewed. No pertinent family history. ?Family Psychiatric  History: See previous ?Social History:  ?Social History  ? ?Substance and Sexual Activity  ?Alcohol Use Not Currently  ?   ?Social History  ? ?Substance and Sexual Activity  ?Drug Use Not Currently  ?  ?Social History  ? ?Socioeconomic History  ? Marital status: Single  ?  Spouse name: Not on file  ? Number of children: Not on file  ? Years of education: Not on file  ? Highest education level: Not on file  ?Occupational History  ? Not on file  ?Tobacco Use  ? Smoking status: Never  ?  Passive exposure: Never  ? Smokeless tobacco: Never  ?Vaping Use  ? Vaping Use: Unknown  ?Substance and Sexual Activity  ? Alcohol use: Not Currently  ? Drug use: Not Currently  ? Sexual activity: Not Currently  ?Other Topics Concern  ? Not on file  ?Social History Narrative  ? Not on file  ? ?Social Determinants of Health  ? ?Financial Resource Strain: Not on file  ?Food Insecurity: Not on file  ?Transportation Needs:  Not on file  ?Physical Activity: Not on file  ?Stress: Not on file  ?Social Connections: Not on file  ? ?Additional Social History:  ?  ?  ?  ?  ?  ?  ?  ?  ?  ?  ?  ? ?Sleep: Fair ? ?Appetite:  Fair ? ?Current Medications: ?Current Facility-Administered Medications  ?Medication Dose Route Frequency Provider Last Rate Last Admin  ? acetaminophen (TYLENOL) tablet 650 mg  650 mg Oral Q6H PRN Marlen Koman, [name], MD   650 mg at 04/12/22 0136  ? alum & mag hydroxide-simeth (MAALOX/MYLANTA) 200-200-20 MG/5ML suspension 30 mL  30 mL Oral Q4H PRN Ariella Voit T, MD   30 mL at 04/12/22 0136  ? aspirin EC tablet 81 mg  81 mg Oral Daily Chole Driver, [name], MD   81 mg at 04/17/22 1127  ? atorvastatin (LIPITOR) tablet 20 mg  20 mg Oral Daily Fredrika Canby T, MD   20 mg at 04/17/22 1127  ? atropine 1 % ophthalmic solution 1 drop  1 drop Sublingual QID [name], MD   1 drop at 04/17/22 1128  ? benztropine (COGENTIN) tablet 0.5 mg  0.5 mg Oral BID Kimi Bordeau T, MD   0.5 mg at 04/17/22 1127  ? clonazePAM (KLONOPIN) tablet 1 mg  1 mg Oral TID Willia Lampert, [name], MD   1 mg at 04/17/22 1127  ? cloZAPine (CLOZARIL) tablet 300 mg  300 mg Oral QHS  Tilford Deaton, [name], MD   300 mg at 04/16/22 2102  ? diphenhydrAMINE (BENADRYL) capsule 50 mg  50 mg Oral Q6H PRN Josecarlos Harriott, [name], MD   50 mg at 04/10/22 1000  ? Or  ? diphenhydrAMINE (BENADRYL) injection 50 mg  50 mg Intramuscular Q6H PRN Devan Babino T, MD      ? gabapentin (NEURONTIN) capsule 300 mg  300 mg Oral TID Mumtaz Lovins T, MD   300 mg at 04/17/22 1127  ? haloperidol (HALDOL) tablet 5 mg  5 mg Oral TID Grae Cannata, [name], MD   5 mg at 04/17/22 1127  ? haloperidol (HALDOL) tablet 5 mg  5 mg Oral Q6H PRN Annella Prowell, [name], MD   5 mg at 04/14/22 2102  ? Or  ? haloperidol lactate (HALDOL) injection 5 mg  5 mg Intramuscular Q6H PRN Gus Littler T, MD      ? magnesium hydroxide (MILK OF MAGNESIA) suspension 30 mL  30 mL Oral Daily PRN Hagen Bohorquez, [name], MD   30 mL at 04/16/22 1607  ? metoprolol  succinate (TOPROL-XL) 24 hr tablet 25 mg  25 mg Oral Daily Marilu Rylander T, MD   25 mg at 04/17/22 1127  ? paliperidone (INVEGA SUSTENNA) injection 156 mg  156 mg Intramuscular Q28 days Khaleel Beckom, [name], MD   156 mg at 03/29/22 1445  ? temazepam (RESTORIL) capsule 15 mg  15 mg Oral QHS Latonja Bobeck T, MD   15 mg at 04/16/22 2102  ? ziprasidone (GEODON) injection 20 mg  20 mg Intramuscular Q12H PRN Imogene Gravelle, [name], MD   20 mg at 03/19/22 1551  ? ? ?Lab Results: No results found for this or any previous visit (from the past 48 hour(s)). ? ?Blood Alcohol level:  ?Lab Results  ?Component Value Date  ? ETH <10 07/20/2021  ? ? ?Metabolic Disorder Labs: ?Lab Results  ?Component Value Date  ? HGBA1C 5.5 04/10/2022  ? MPG 111.15 04/10/2022  ? ?No results found for: PROLACTIN ?Lab Results  ?Component Value Date  ? CHOL 167 11/20/2021  ? TRIG 107 11/20/2021  ? HDL 26 (L) 11/20/2021  ? CHOLHDL 6.4 11/20/2021  ? VLDL 21 11/20/2021  ? LDLCALC 120 (H) 11/20/2021  ? ? ?Physical Findings: ?AIMS:  , ,  ,  ,    ?CIWA:    ?COWS:    ? ?Musculoskeletal: ?Strength & Muscle Tone: within normal limits ?Gait & Station: normal ?Patient leans: N/A ? ?Psychiatric Specialty Exam: ? ?Presentation  ?General Appearance: Disheveled ? ?Eye Contact:Fleeting ? ?Speech:Garbled ? ?Speech Volume:Decreased ? ?Handedness:Right ? ? ?Mood and Affect  ?Mood:Anxious ? ?Affect:Constricted ? ? ?Thought Process  ?Thought Processes:Irrevelant ? ?Descriptions of Associations:Loose ? ?Orientation:None ? ?Thought Content:Illogical ? ?History of Schizophrenia/Schizoaffective disorder:Yes ? ?Duration of Psychotic Symptoms:Greater than six months ? ?Hallucinations:No data recorded ?Ideas of Reference:Paranoia ? ?Suicidal Thoughts:No data recorded ?Homicidal Thoughts:No data recorded ? ?Sensorium  ?Memory:Immediate Poor; Remote Poor ? ?Judgment:Impaired ? ?Insight:Lacking ? ? ?Executive Functions  ?Concentration:Poor ? ?Attention Span:Poor ? ?Recall:Poor ? ?Fund of  Knowledge:Poor ? ?Language:Poor ? ? ?Psychomotor Activity  ?Psychomotor Activity:No data recorded ? ?Assets  ?Assets:Housing; Resilience; Social Support; Talents/Skills ? ? ?Sleep  ?Sleep:No data recorded ? ? ?Physical Exam: ?Physical Exam ?Vitals and nursing note reviewed.  ?Constitutional:   ?   Appearance: Normal appearance.  ?HENT:  ?   Head: Normocephalic and atraumatic.  ?   Mouth/Throat:  ?   Pharynx: Oropharynx is clear.  ?Eyes:  ?   Pupils:  Pupils are equal, round, and reactive to light.  ?Cardiovascular:  ?   Rate and Rhythm: Normal rate and regular rhythm.  ?Pulmonary:  ?   Effort: Pulmonary effort is normal.  ?   Breath sounds: Normal breath sounds.  ?Abdominal:  ?   General: Abdomen is flat.  ?   Palpations: Abdomen is soft.  ?Musculoskeletal:     ?   General: Normal range of motion.  ?Skin: ?   General: Skin is warm and dry.  ?Neurological:  ?   General: No focal deficit present.  ?   Mental Status: He is alert. Mental status is at baseline.  ?Psychiatric:     ?   Attention and Perception: He is inattentive.     ?   Mood and Affect: Affect is blunt.     ?   Speech: He is noncommunicative.  ? ?Review of Systems  ?Unable to perform ROS: Language  ?Constitutional: Negative.   ?HENT: Negative.    ?Eyes: Negative.   ?Respiratory: Negative.    ?Cardiovascular: Negative.   ?Gastrointestinal: Negative.   ?Musculoskeletal: Negative.   ?Skin: Negative.   ?Neurological: Negative.   ?Blood pressure (!) 118/91, pulse 88, temperature 98.1 ?F (36.7 ?C), temperature source Oral, resp. rate 18, height 5\' 8"  (1.727 m), weight 85.8 kg, SpO2 99 %. Body mass index is 28.76 kg/m?. ? ? ?Treatment Plan Summary: ?Medication management and Plan no change to medication.  I was told that his commitment ran out.  I will check the chart and if that is true we will reinitiate it ? ?Alethia Berthold, MD ?04/17/2022, 3:19 PM ? ?

## 2022-04-17 NOTE — Progress Notes (Signed)
Patient is still sleeping, this writer will administer morning medication when he wakes up. MD will be notified. ?

## 2022-04-18 NOTE — Group Note (Deleted)
[facility] LCSW Group Therapy Note ? ? ?Group Date: 04/18/2022 ?Start Time: 1300 ?End Time: 1400 ? ? ?Type of Therapy/Topic:  Group Therapy:  Emotion Regulation ? ?Participation Level:  {[facility] PARTICIPATION LEVEL:22264}  ? ?Mood: ? ?Description of Group:   ? The purpose of this group is to assist patients in learning to regulate negative emotions and experience positive emotions. Patients will be guided to discuss ways in which they have been vulnerable to their negative emotions. These vulnerabilities will be juxtaposed with experiences of positive emotions or situations, and patients challenged to use positive emotions to combat negative ones. Special emphasis will be placed on coping with negative emotions in conflict situations, and patients will process healthy conflict resolution skills. ? ?Therapeutic Goals: ?1. Patient will identify two positive emotions or experiences to reflect on in order to balance out negative emotions:  ?2. Patient will label two or more emotions that they find the most difficult to experience:  ?3. Patient will be able to demonstrate positive conflict resolution skills through discussion or role plays:  ? ?Summary of Patient Progress: ? ? ?*** ? ? ? ?Therapeutic Modalities:   ?Cognitive Behavioral Therapy ?Feelings Identification ?Dialectical Behavioral Therapy ? ? ?Harden Mo, LCSW ?

## 2022-04-18 NOTE — TOC Progression Note (Signed)
Transition of Care (TOC) - Progression Note  ? ? ?Patient Details  ?Name: Adam [name] ?MRN: [id number] ?Date of Birth: January 20, 1961 ? ?Transition of Care (TOC) CM/SW Contact  ?Seville Cellar, RN ?Phone Number: ?04/18/2022, 11:21 AM ? ?Clinical Narrative:    ?Completed team meeting yesterday with [facility] and hospital leadership team. Reviewed reasonings behind numerous denials including neurocognitive centers. [facility] and [employer]. Patient does not qualify for [facility] due to no TBI waiver. Does not qualify for [facility] due to no IDD level of care. [facility] unable to accomodate out of state placement due to payment of [place] Medicaid. [facility] denied due to no psychiatrist on staff. [facility] not accepting new admissions due to renovations for potentially 2 years. [facility] considered however not a locked facility and concerns regarding elopment. Recommendation made to review case with psychiatric provider and discuss appropriateness for facility that is not locked.  ? ? ?  ?  ? ?Expected Discharge Plan and Services ?  ?  ?  ?  ?  ?                ?  ?  ?  ?  ?  ?  ?  ?  ?  ?  ? ? ?Social Determinants of Health (SDOH) Interventions ?  ? ?Readmission Risk Interventions ?   ? View : No data to display.  ?  ?  ?  ? ? ?

## 2022-04-18 NOTE — Progress Notes (Signed)
Pt. 1:1 with his sitter. Pt denies SI/HI and AVH. He denies having any issues with anxiety and depressions. He does not talk a lot, he repeats the word  "no" a lot, makes noises and did some singing. Pt difficulty communicating thoughts due having a stroke and a brain injury  the past. ? ?

## 2022-04-18 NOTE — Progress Notes (Signed)
[facility] MD Progress Note ? ?04/18/2022 10:50 AM ?Adam [name]  ?MRN:  [id number] ?Subjective:   ?Follow-up patient with brain injury and schizophrenia.  No change in behavior no change in complaint ?Principal Problem: Difficulty controlling behavior as late effect of traumatic brain injury ([place]) ?Diagnosis: Principal Problem: ?  Difficulty controlling behavior as late effect of traumatic brain injury ([place]) ?Active Problems: ?  Schizophrenia, chronic condition with acute exacerbation ([place]) ?  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 ?Total Time spent with patient: 30 minutes ? ?Past Psychiatric History: History of schizophrenia complicated by brain injury ? ?Past Medical History:  ?Past Medical History:  ?Diagnosis Date  ? Myocardial infarction [facility])   ? Schizophrenia ([place])   ? Stroke [facility])   ? TBI (traumatic brain injury) ([place])   ? History reviewed. No pertinent surgical history. ?Family History: History reviewed. No pertinent family history. ?Family Psychiatric  History: See previous ?Social History:  ?Social History  ? ?Substance and Sexual Activity  ?Alcohol Use Not Currently  ?   ?Social History  ? ?Substance and Sexual Activity  ?Drug Use Not Currently  ?  ?Social History  ? ?Socioeconomic History  ? Marital status: Single  ?  Spouse name: Not on file  ? Number of children: Not on file  ? Years of education: Not on file  ? Highest education level: Not on file  ?Occupational History  ? Not on file  ?Tobacco Use  ? Smoking status: Never  ?  Passive exposure: Never  ? Smokeless tobacco: Never  ?Vaping Use  ? Vaping Use: Unknown  ?Substance and Sexual Activity  ? Alcohol use: Not Currently  ? Drug use: Not Currently  ? Sexual activity: Not Currently  ?Other Topics Concern  ? Not on file  ?Social History Narrative  ? Not on file  ? ?Social Determinants of Health  ? ?Financial Resource Strain: Not on file  ?Food Insecurity: Not on file  ?Transportation Needs: Not on file  ?Physical Activity:  Not on file  ?Stress: Not on file  ?Social Connections: Not on file  ? ?Additional Social History:  ?  ?  ?  ?  ?  ?  ?  ?  ?  ?  ?  ? ?Sleep: Fair ? ?Appetite:  Fair ? ?Current Medications: ?Current Facility-Administered Medications  ?Medication Dose Route Frequency Provider Last Rate Last Admin  ? acetaminophen (TYLENOL) tablet 650 mg  650 mg Oral Q6H PRN Cathlene Gardella, [name], MD   650 mg at 04/12/22 0136  ? alum & mag hydroxide-simeth (MAALOX/MYLANTA) 200-200-20 MG/5ML suspension 30 mL  30 mL Oral Q4H PRN Lenyx Boody T, MD   30 mL at 04/12/22 0136  ? aspirin EC tablet 81 mg  81 mg Oral Daily Sabeen Piechocki, [name], MD   81 mg at 04/18/22 [id number]  ? atorvastatin (LIPITOR) tablet 20 mg  20 mg Oral Daily Zaryah Seckel T, MD   20 mg at 04/18/22 [id number]  ? atropine 1 % ophthalmic solution 1 drop  1 drop Sublingual QID [name], MD   1 drop at 04/18/22 [id number]  ? benztropine (COGENTIN) tablet 0.5 mg  0.5 mg Oral BID Mattilynn Forrer, [name], MD   0.5 mg at 04/18/22 [id number]  ? clonazePAM (KLONOPIN) tablet 1 mg  1 mg Oral TID Anastasija Anfinson, [name], MD   1 mg at 04/18/22 [id number]  ? cloZAPine (CLOZARIL) tablet 300 mg  300 mg Oral QHS Amarya Kuehl T, MD   300 mg at 04/17/22  2055  ? diphenhydrAMINE (BENADRYL) capsule 50 mg  50 mg Oral Q6H PRN Turner Baillie, [name], MD   50 mg at 04/10/22 1000  ? Or  ? diphenhydrAMINE (BENADRYL) injection 50 mg  50 mg Intramuscular Q6H PRN Laurin Paulo T, MD      ? gabapentin (NEURONTIN) capsule 300 mg  300 mg Oral TID Talon Witting, [name], MD   300 mg at 04/18/22 [id number]  ? haloperidol (HALDOL) tablet 5 mg  5 mg Oral TID Raima Geathers, [name], MD   5 mg at 04/18/22 [id number]  ? haloperidol (HALDOL) tablet 5 mg  5 mg Oral Q6H PRN Simi Briel, [name], MD   5 mg at 04/14/22 2102  ? Or  ? haloperidol lactate (HALDOL) injection 5 mg  5 mg Intramuscular Q6H PRN Maite Burlison T, MD      ? magnesium hydroxide (MILK OF MAGNESIA) suspension 30 mL  30 mL Oral Daily PRN Margarette Vannatter, [name], MD   30 mL at 04/16/22 1607  ? metoprolol succinate (TOPROL-XL) 24 hr tablet 25  mg  25 mg Oral Daily Saiya Crist, [name], MD   25 mg at 04/18/22 [id number]  ? paliperidone (INVEGA SUSTENNA) injection 156 mg  156 mg Intramuscular Q28 days Brayant Dorr, [name], MD   156 mg at 03/29/22 1445  ? temazepam (RESTORIL) capsule 15 mg  15 mg Oral QHS Tavone Caesar T, MD   15 mg at 04/17/22 2055  ? ziprasidone (GEODON) injection 20 mg  20 mg Intramuscular Q12H PRN Claire Bridge, [name], MD   20 mg at 03/19/22 1551  ? ? ?Lab Results: No results found for this or any previous visit (from the past 48 hour(s)). ? ?Blood Alcohol level:  ?Lab Results  ?Component Value Date  ? ETH <10 07/20/2021  ? ? ?Metabolic Disorder Labs: ?Lab Results  ?Component Value Date  ? HGBA1C 5.5 04/10/2022  ? MPG 111.15 04/10/2022  ? ?No results found for: PROLACTIN ?Lab Results  ?Component Value Date  ? CHOL 167 11/20/2021  ? TRIG 107 11/20/2021  ? HDL 26 (L) 11/20/2021  ? CHOLHDL 6.4 11/20/2021  ? VLDL 21 11/20/2021  ? LDLCALC 120 (H) 11/20/2021  ? ? ?Physical Findings: ?AIMS:  , ,  ,  ,    ?CIWA:    ?COWS:    ? ?Musculoskeletal: ?Strength & Muscle Tone: within normal limits ?Gait & Station: normal ?Patient leans: N/A ? ?Psychiatric Specialty Exam: ? ?Presentation  ?General Appearance: Disheveled ? ?Eye Contact:Fleeting ? ?Speech:Garbled ? ?Speech Volume:Decreased ? ?Handedness:Right ? ? ?Mood and Affect  ?Mood:Anxious ? ?Affect:Constricted ? ? ?Thought Process  ?Thought Processes:Irrevelant ? ?Descriptions of Associations:Loose ? ?Orientation:None ? ?Thought Content:Illogical ? ?History of Schizophrenia/Schizoaffective disorder:Yes ? ?Duration of Psychotic Symptoms:Greater than six months ? ?Hallucinations:No data recorded ?Ideas of Reference:Paranoia ? ?Suicidal Thoughts:No data recorded ?Homicidal Thoughts:No data recorded ? ?Sensorium  ?Memory:Immediate Poor; Remote Poor ? ?Judgment:Impaired ? ?Insight:Lacking ? ? ?Executive Functions  ?Concentration:Poor ? ?Attention Span:Poor ? ?Recall:Poor ? ?Fund of  Knowledge:Poor ? ?Language:Poor ? ? ?Psychomotor Activity  ?Psychomotor Activity:No data recorded ? ?Assets  ?Assets:Housing; Resilience; Social Support; Talents/Skills ? ? ?Sleep  ?Sleep:No data recorded ? ? ?Physical Exam: ?Physical Exam ?Vitals and nursing note reviewed.  ?Constitutional:   ?   Appearance: Normal appearance.  ?HENT:  ?   Head: Normocephalic and atraumatic.  ?   Mouth/Throat:  ?   Pharynx: Oropharynx is clear.  ?Eyes:  ?   Pupils: Pupils are equal, round, and reactive to light.  ?Cardiovascular:  ?  Rate and Rhythm: Normal rate and regular rhythm.  ?Pulmonary:  ?   Effort: Pulmonary effort is normal.  ?   Breath sounds: Normal breath sounds.  ?Abdominal:  ?   General: Abdomen is flat.  ?   Palpations: Abdomen is soft.  ?Musculoskeletal:     ?   General: Normal range of motion.  ?Skin: ?   General: Skin is warm and dry.  ?Neurological:  ?   General: No focal deficit present.  ?   Mental Status: He is alert. Mental status is at baseline.  ?Psychiatric:     ?   Attention and Perception: He is inattentive.     ?   Mood and Affect: Affect is labile.     ?   Speech: He is noncommunicative.     ?   Cognition and Memory: Cognition is impaired.  ? ?Review of Systems  ?Unable to perform ROS: Language  ?Blood pressure (!) 118/91, pulse 88, temperature 98.1 ?F (36.7 ?C), temperature source Oral, resp. rate 18, height 5\' 8"  (1.727 m), weight 85.8 kg, SpO2 99 %. Body mass index is 28.76 kg/m?. ? ? ?Treatment Plan Summary: ?Medication management and Plan daily review with treatment team.  Continue current medication.  Daily engagement with helping the patient stay calm while still including him in activities.  Still working on discharge planning ? ?Alethia Berthold, MD ?04/18/2022, 10:50 AM ? ?

## 2022-04-18 NOTE — Plan of Care (Signed)
?  Problem: Safety: ?Goal: Violent Restraint(s) ?Outcome: Progressing ?  ?Problem: Education: ?Goal: Ability to state activities that reduce stress will improve ?Outcome: Progressing ?  ?Problem: Coping: ?Goal: Ability to identify and develop effective coping behavior will improve ?Outcome: Progressing ?  ?Problem: Self-Concept: ?Goal: Ability to identify factors that promote anxiety will improve ?Outcome: Progressing ?Goal: Level of anxiety will decrease ?Outcome: Progressing ?Goal: Ability to modify response to factors that promote anxiety will improve ?Outcome: Progressing ?  ?

## 2022-04-18 NOTE — Progress Notes (Signed)
Recreation Therapy Notes ? ?Date: 04/18/2022 ? ?Time: 10:35 am   ? ?Location: Craft room  ? ?Behavioral response: N/A ?  ?Intervention Topic: Self-care  ? ?Discussion/Intervention: ?Patient refused to attend group.  ? ?Clinical Observations/Feedback:  ?Patient refused to attend group.  ?  ?Kanai Hilger LRT/CTRS ? ? ? ? ? ? ? ?Bird Tailor ?04/18/2022 12:41 PM ?

## 2022-04-18 NOTE — Progress Notes (Signed)
Pt increasingly agitated, pacing, attempting to follow staff down hallway to exit. Difficult to redirect . Prn medication given after many refusals of patient screaming ''no!'' Pt also given multiple printouts of band singers he requested. ?

## 2022-04-18 NOTE — Progress Notes (Addendum)
Patient presents with irritable mood, affect labile. Adam [name] remains very labile, with unpredictable behaviors, becoming easily agitated today. This am patient had soiled his linens and clothes and was agitated screaming loudly. This Clinical research associate and MHT attempted to verbally redirect patient but he continued to scream '' No'' loudly. Patient was offered scrub and new items retrieved from locker until clothes in room were washed and dried. The patient attempted to refuse am medications but with much encouragement did accept. He continues to show poor impulse control, banging repeatedly and moaning loudly on the back unit. Sitter present. Discussed above in treatment team and MD aware. Per reports this is continued behavior displayed at patients baseline. 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 '' Patient is safe, has calmed with minimal stimulation after am medications but sitter reports she has sat with patient consistently and today '' is worse than usual '' per sitter report. Pt is safe. Currently redirectable with sitter and staff support. Behaviors do not appear to be acutely escalating to warrant prn medication however, will con't to monitor.  ?At 1300 patient allowed outside with sitter and enjoyed fresh air and music. Patient was able to verbalize a song request and was able to hum and sign along.  ?

## 2022-04-19 NOTE — Progress Notes (Signed)
[facility] MD Progress Note  04/19/2022 12:00 PM Adam [name]  MRN:  [id number] Subjective: Patient has no complaints.  No new behavior problems.  No change in presentation. 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 No pertinent surgical history. Family History: History reviewed. No pertinent family history. Family Psychiatric  History: See previous Social History:  Social History   Substance and Sexual Activity  Alcohol Use Not Currently     Social History   Substance and Sexual Activity  Drug Use Not Currently    Social History   Socioeconomic History   Marital status: Single    Spouse name: Not on file   Number of children: Not on file   Years of education: Not on file   Highest education level: Not on file  Occupational History   Not on file  Tobacco Use   Smoking status: Never    Passive exposure: Never   Smokeless tobacco: Never  Vaping Use   Vaping Use: Unknown  Substance and Sexual Activity   Alcohol use: Not Currently   Drug use: Not Currently   Sexual activity: Not Currently  Other Topics Concern   Not on file  Social History Narrative   Not on file   Social Determinants of Health   Financial Resource Strain: Not on file  Food Insecurity: Not on file  Transportation Needs: Not on file  Physical Activity: Not on file  Stress: Not on  file  Social Connections: Not on file   Additional Social History:                         Sleep: Fair  Appetite:  Fair  Current Medications: Current Facility-Administered Medications  Medication Dose Route Frequency Provider Last Rate Last Admin   acetaminophen (TYLENOL) tablet 650 mg  650 mg Oral Q6H PRN Romina Divirgilio T, MD   650 mg at 04/12/22 0136   alum & mag hydroxide-simeth (MAALOX/MYLANTA) 200-200-20 MG/5ML suspension 30 mL  30 mL Oral Q4H PRN Eldwin Volkov T, MD   30 mL at 04/12/22 0136   aspirin EC tablet 81 mg  81 mg Oral Daily Shatoria Stooksbury, [name], MD   81 mg at 04/19/22 3646   atorvastatin (LIPITOR) tablet 20 mg  20 mg Oral Daily Tabby Beaston T, MD   20 mg at 04/19/22 0838   atropine 1 % ophthalmic solution 1 drop  1 drop Sublingual QID [name], MD   1 drop at 04/19/22 0839   benztropine (COGENTIN) tablet 0.5 mg  0.5 mg Oral BID Jameal Razzano T, MD   0.5 mg at 04/19/22 8032   clonazePAM (KLONOPIN) tablet 1 mg  1 mg Oral TID Carter Kassel, [name], MD   1 mg at 04/19/22 1224   cloZAPine (CLOZARIL) tablet 300 mg  300 mg Oral QHS Noheli Melder T, MD   300 mg at 04/18/22 2122   diphenhydrAMINE (  BENADRYL) capsule 50 mg  50 mg Oral Q6H PRN Kaylani Fromme T, MD   50 mg at 04/18/22 1516   Or   diphenhydrAMINE (BENADRYL) injection 50 mg  50 mg Intramuscular Q6H PRN Samaj Wessells T, MD       gabapentin (NEURONTIN) capsule 300 mg  300 mg Oral TID Rossie Bretado, [name], MD   300 mg at 04/19/22 6759   haloperidol (HALDOL) tablet 5 mg  5 mg Oral TID Tyress Loden, [name], MD   5 mg at 04/19/22 1638   haloperidol (HALDOL) tablet 5 mg  5 mg Oral Q6H PRN Myles Tavella T, MD   5 mg at 04/18/22 1516   Or   haloperidol lactate (HALDOL) injection 5 mg  5 mg Intramuscular Q6H PRN Jamani Bearce T, MD       magnesium hydroxide (MILK OF MAGNESIA) suspension 30 mL  30 mL Oral Daily PRN Sherwin Hollingshed T, MD   30 mL at 04/16/22 1607   metoprolol succinate (TOPROL-XL) 24 hr tablet 25 mg  25 mg Oral Daily  Eliyas Suddreth T, MD   25 mg at 04/19/22 0837   paliperidone (INVEGA SUSTENNA) injection 156 mg  156 mg Intramuscular Q28 days Lelar Farewell T, MD   156 mg at 03/29/22 1445   temazepam (RESTORIL) capsule 15 mg  15 mg Oral QHS Janeah Kovacich T, MD   15 mg at 04/18/22 2122   ziprasidone (GEODON) injection 20 mg  20 mg Intramuscular Q12H PRN Legrande Hao, [name], MD   20 mg at 03/19/22 1551    Lab Results: No results found for this or any previous visit (from the past 48 hour(s)).  Blood Alcohol level:  Lab Results  Component Value Date   ETH <10 07/20/2021    Metabolic Disorder Labs: Lab Results  Component Value Date   HGBA1C 5.5 04/10/2022   MPG 111.15 04/10/2022   No results found for: PROLACTIN Lab Results  Component Value Date   CHOL 167 11/20/2021   TRIG 107 11/20/2021   HDL 26 (L) 11/20/2021   CHOLHDL 6.4 11/20/2021   VLDL 21 11/20/2021   LDLCALC 120 (H) 11/20/2021    Physical Findings: AIMS:  , ,  ,  ,    CIWA:    COWS:     Musculoskeletal: Strength & Muscle Tone: within normal limits Gait & Station: normal Patient leans: N/A  Psychiatric Specialty Exam:  Presentation  General Appearance: Disheveled  Eye Contact:Fleeting  Speech:Garbled  Speech Volume:Decreased  Handedness:Right   Mood and Affect  Mood:Anxious  Affect:Constricted   Thought Process  Thought Processes:Irrevelant  Descriptions of Associations:Loose  Orientation:None  Thought Content:Illogical  History of Schizophrenia/Schizoaffective disorder:Yes  Duration of Psychotic Symptoms:Greater than six months  Hallucinations:No data recorded Ideas of Reference:Paranoia  Suicidal Thoughts:No data recorded Homicidal Thoughts:No data recorded  Sensorium  Memory:Immediate Poor; Remote Poor  Judgment:Impaired  Insight:Lacking   Executive Functions  Concentration:Poor  Attention Span:Poor  Recall:Poor  Fund of Knowledge:Poor  Language:Poor   Psychomotor Activity   Psychomotor Activity:No data recorded  Assets  Assets:Housing; Resilience; Social Support; Talents/Skills   Sleep  Sleep:No data recorded   Physical Exam: Physical Exam Vitals and nursing note reviewed.  Constitutional:      Appearance: Normal appearance.  HENT:     Head: Normocephalic and atraumatic.     Mouth/Throat:     Pharynx: Oropharynx is clear.  Eyes:     Pupils: Pupils are equal, round, and reactive to light.  Cardiovascular:  Rate and Rhythm: Normal rate and regular rhythm.  Pulmonary:     Effort: Pulmonary effort is normal.     Breath sounds: Normal breath sounds.  Abdominal:     General: Abdomen is flat.     Palpations: Abdomen is soft.  Musculoskeletal:        General: Normal range of motion.  Skin:    General: Skin is warm and dry.  Neurological:     General: No focal deficit present.     Mental Status: He is alert. Mental status is at baseline.  Psychiatric:        Mood and Affect: Mood normal.        Thought Content: Thought content normal.   Review of Systems  Constitutional: Negative.   HENT: Negative.    Eyes: Negative.   Respiratory: Negative.    Cardiovascular: Negative.   Gastrointestinal: Negative.   Musculoskeletal: Negative.   Skin: Negative.   Neurological: Negative.   Psychiatric/Behavioral: Negative.    Blood pressure 117/78, pulse (!) 113, temperature 97.7 F (36.5 C), resp. rate 17, height 5\' 8"  (1.727 m), weight 85.8 kg, SpO2 100 %. Body mass index is 28.76 kg/m.   Treatment Plan Summary: Medication management and Plan no change to medication management.  Supportive talk as usual.  I am going to try to reach his mother today to discuss a question that came up about some possible discharge options.  , MD 04/19/2022, 12:00 PM

## 2022-04-19 NOTE — Progress Notes (Signed)
Pt asleep in bed and sitter at bedside.

## 2022-04-19 NOTE — Progress Notes (Signed)
Pt resting in bed asleep. 1:1 at bedside.

## 2022-04-19 NOTE — Progress Notes (Signed)
1:1 Patient Hourly Rounding  0800:Patient is in bed, asleep,with his assigned safety sitter present.  0900: Patient is in the back dayroom, with his assigned safety sitter present.  1000: Patient is in his room, awake, with his assigned safety sitter present.  1100: Patient is in his room, awake, with his assigned safety sitter present.  1200: Patient is in bed, asleep, with his assigned safety sitter present.  1300: Patient continues to sleep, with his assigned safety sitter present.  1400: Patient is still sleeping, with his assigned safety sitter present.  1500: Patient is asleep, with his assigned safety sitter present.  1600: Patient is in his room, awake, with his assigned safety sitter present.  1700: Patient is in the hallway, knocking on the double doors, with his assigned safety sitter present.  1800: Patient is in his room, awake, with his assigned safety sitter present.  1900: Patient is in bed, asleep, with his assigned safety sitter present.

## 2022-04-19 NOTE — Progress Notes (Signed)
This writer went to administer scheduled afternoon medication and patient was in bed asleep. This Clinical research associate will give patient his scheduled medication once he wakes up. MD will be notified.

## 2022-04-19 NOTE — Progress Notes (Signed)
Recreation Therapy Notes  Date: 04/19/2022   Time: 10:40 am     Location: Court yard    Behavioral response: N/A   Intervention Topic: Values    Discussion/Intervention: Patient refused to attend group.    Clinical Observations/Feedback:  Patient refused to attend group.    Elizabth Palka LRT/CTRS        Tysheena Ginzburg 04/19/2022 11:08 AM

## 2022-04-19 NOTE — Plan of Care (Signed)
D- Patient alert and oriented to person and situation. Patient presented in a preoccupied, but pleasant mood on assessment this morning. He was sitting in the dayroom, getting his vitals taken when this writer came in. Another staff member, Carlye Grippe, MHT, came in with photos of Cleopatra for patient. Patient's face brightened up and he smiled, and let out a loud sound, as if he was singing. Patient has expressive aphasia and is childlike. Patient does not appear to be in any pain, nor acute distress. This writer is unable to fully assess if patient is experiencing any SI, HI, AVH. Patient had no stated goals for today.  A- Scheduled medications administered to patient, per MD orders. Support and encouragement provided.  Routine safety checks conducted every 15 minutes.  Patient informed to notify staff with problems or concerns.  R- No adverse drug reactions noted. Patient contracts for safety at this time. Patient compliant with medications and treatment plan. Patient receptive, calm, and cooperative. Patient interacts well with others on the unit. Patient remains safe at this time.  Problem: Safety: Goal: Violent Restraint(s) Outcome: Progressing   Problem: Education: Goal: Ability to state activities that reduce stress will improve Outcome: Progressing   Problem: Coping: Goal: Ability to identify and develop effective coping behavior will improve Outcome: Progressing   Problem: Self-Concept: Goal: Ability to identify factors that promote anxiety will improve Outcome: Progressing Goal: Level of anxiety will decrease Outcome: Progressing Goal: Ability to modify response to factors that promote anxiety will improve Outcome: Progressing

## 2022-04-20 NOTE — Progress Notes (Signed)
This Clinical research associate was notified, by lab tech, that patient refused labs.

## 2022-04-20 NOTE — Progress Notes (Signed)
This Clinical research associate held patient's BP medication, due to his BP being 96/61 at 0840. MD was notified.

## 2022-04-20 NOTE — Progress Notes (Signed)
Pt compliant with medication administration per MD orders 

## 2022-04-20 NOTE — Progress Notes (Signed)
1:1 Patient Hourly Rounding  0800: Patient is in the main dayroom, eating breakfast, with his assigned safety sitter present.  0900: Patient is in the hallway, at the nurses station, with his assigned safety sitter present.  1000: Patient is in his room, with his assigned safety sitter present.  1100: Patient is in the main dayroom, with staff, other members on the unit, and his assigned safety present.  1200: Patient is in the main dayroom, eating lunch, with the other members on the unit, as well as his assigned safety sitter.  1300: Patient is in the main  dayroom, with his assigned safety sitter present.  1400: Patient is in the dayroom, on the back hall, with his assigned safety sitter present.  1500: Patient is in his room, awake, with this Clinical research associate as his assigned Recruitment consultant.  1600: Patient is in the main dayroom, watching television, with his assigned safety sitter present.  1700: Patient is in the main dayroom, with staff and other members on the unit. Patient's assigned safety sitter is present.  1800:Patient is in his room, awake and composed, with this Clinical research associate as his assigned Recruitment consultant.  1900: Patient is in the main dayroom, watching television, with his assigned safety sitter present.

## 2022-04-20 NOTE — Progress Notes (Signed)
Patient on 1:1 for safety with a sitter present. Pt is without complaint at this time. Pt remains safe on the unit.

## 2022-04-20 NOTE — Progress Notes (Signed)
Patient came up to the nurses station, repeatedly saying "Milk of Magnesia", so this Clinical research associate gave him PRN MOM. Patient tolerated medication well, without any issues. Patient remains safe on the unit.

## 2022-04-20 NOTE — Progress Notes (Signed)
When this writer took patient's morning medication to him, he took all medicine, except for Klonopin. This Clinical research associate wasted medication with Ivonne Andrew, Charity fundraiser. MD will be notified.

## 2022-04-20 NOTE — Progress Notes (Signed)
Patient ID: Adam [name], male   DOB: 11-08-1961, 61 y.o.   MRN: [id number] Case reviewed with treatment team as well as with transition of care coordinator.  Patient's behavior has been stable without any aggression or agitation or any attempts at elopement.  He seems to get along well with staff and other patients and respond to redirection well.  Given his chronic impairment behavior may sometimes still be unpredictable which is to be expected however I think that he would be safe for the potential of admission to the [facility] treatment center which as I understand it has a lower level of security.  I do not think he is at elevated risk of elopement compared to other patients and his cohort.  I am putting in this note to encourage treatment team to follow up with this option which I think could be a good solution to discharge for him

## 2022-04-20 NOTE — Plan of Care (Signed)
D- Patient alert and oriented to person and situation. Patient presented in a preoccupied, but pleasant mood on assessment reporting, with the help of staff, that he slept good last night and had no complaints to voice to this Probation officer. Patient has expressive aphasia and is childlike and can be needy at times. This writer is unable to full assess if patient is experiencing any SI/HI/AVH, however, when this Probation officer asked patient, he seemed to get slightly agitated and said "no". Patient does not appear to be in any pain nor distress. Per his self-inventory, patient's goal for today is to "watch Cleopatra".  A- Scheduled medications administered to patient, per MD orders. Support and encouragement provided.  Routine safety checks conducted every 15 minutes.  Patient informed to notify staff with problems or concerns.  R- No adverse drug reactions noted. Patient contracts for safety at this time. Patient compliant with medications and treatment plan. Patient receptive, calm, and cooperative. Patient interacts well with others on the unit.  Patient remains safe at this time.  Problem: Safety: Goal: Violent Restraint(s) Outcome: Progressing   Problem: Education: Goal: Ability to state activities that reduce stress will improve Outcome: Progressing   Problem: Coping: Goal: Ability to identify and develop effective coping behavior will improve Outcome: Progressing   Problem: Self-Concept: Goal: Ability to identify factors that promote anxiety will improve Outcome: Progressing Goal: Level of anxiety will decrease Outcome: Progressing Goal: Ability to modify response to factors that promote anxiety will improve Outcome: Progressing

## 2022-04-20 NOTE — Progress Notes (Signed)
[facility] MD Progress Note  04/20/2022 3:19 PM Adam [name]  MRN:  [id number] Subjective: Follow-up patient with schizophrenia and brain injury.  No new complaints.  Behavior stable and calm.  Able to come out into the milieu every day.  No dangerous behavior.  Still awaiting placement options. 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 No pertinent surgical history. Family History: History reviewed. No pertinent family history. Family Psychiatric  History: See previous Social History:  Social History   Substance and Sexual Activity  Alcohol Use Not Currently     Social History   Substance and Sexual Activity  Drug Use Not Currently    Social History   Socioeconomic History   Marital status: Single    Spouse name: Not on file   Number of children: Not on file   Years of education: Not on file   Highest education level: Not on file  Occupational History   Not on file  Tobacco Use   Smoking status: Never    Passive exposure: Never   Smokeless tobacco: Never  Vaping Use   Vaping Use: Unknown  Substance and Sexual Activity   Alcohol use: Not Currently   Drug use: Not Currently   Sexual activity: Not Currently  Other Topics Concern   Not on file  Social History Narrative   Not on file   Social Determinants of Health   Financial Resource Strain: Not on  file  Food Insecurity: Not on file  Transportation Needs: Not on file  Physical Activity: Not on file  Stress: Not on file  Social Connections: Not on file   Additional Social History:                         Sleep: Fair  Appetite:  Fair  Current Medications: Current Facility-Administered Medications  Medication Dose Route Frequency Provider Last Rate Last Admin   acetaminophen (TYLENOL) tablet 650 mg  650 mg Oral Q6H PRN Eliany Mccarter T, MD   650 mg at 04/12/22 0136   alum & mag hydroxide-simeth (MAALOX/MYLANTA) 200-200-20 MG/5ML suspension 30 mL  30 mL Oral Q4H PRN Tasheem Elms T, MD   30 mL at 04/12/22 0136   aspirin EC tablet 81 mg  81 mg Oral Daily Jaedan Schuman, [name], MD   81 mg at 04/20/22 0849   atorvastatin (LIPITOR) tablet 20 mg  20 mg Oral Daily Allysa Governale T, MD   20 mg at 04/20/22 0849   atropine 1 % ophthalmic solution 1 drop  1 drop Sublingual QID [name], MD   1 drop at 04/20/22 1227   benztropine (COGENTIN) tablet 0.5 mg  0.5 mg Oral BID Leanndra Pember T, MD   0.5 mg at 04/20/22 0849   clonazePAM (KLONOPIN) tablet 1 mg  1 mg Oral TID [name], [name], MD   1 mg at 04/20/22 1227   cloZAPine (CLOZARIL) tablet 300 mg  300 mg Oral QHS Zendayah Hardgrave T, MD   300 mg at 04/19/22 2106   diphenhydrAMINE (BENADRYL) capsule 50 mg  50 mg Oral Q6H PRN Lawrie Tunks T, MD   50 mg at 04/18/22 1516   Or   diphenhydrAMINE (BENADRYL) injection 50 mg  50 mg Intramuscular Q6H PRN Alyene Predmore T, MD       gabapentin (NEURONTIN) capsule 300 mg  300 mg Oral TID Elva Mauro T, MD   300 mg at 04/20/22 1227   haloperidol (HALDOL) tablet 5 mg  5 mg Oral TID Samanthajo Payano T, MD   5 mg at 04/20/22 1227   haloperidol (HALDOL) tablet 5 mg  5 mg Oral Q6H PRN Quintavis Brands T, MD   5 mg at 04/18/22 1516   Or   haloperidol lactate (HALDOL) injection 5 mg  5 mg Intramuscular Q6H PRN Carlisa Eble T, MD       magnesium hydroxide (MILK OF MAGNESIA) suspension 30 mL  30 mL Oral Daily PRN  Alexiz Sustaita T, MD   30 mL at 04/20/22 1411   metoprolol succinate (TOPROL-XL) 24 hr tablet 25 mg  25 mg Oral Daily Terik Haughey T, MD   25 mg at 04/19/22 0837   paliperidone (INVEGA SUSTENNA) injection 156 mg  156 mg Intramuscular Q28 days Taliah Porche T, MD   156 mg at 03/29/22 1445   temazepam (RESTORIL) capsule 15 mg  15 mg Oral QHS Kalla Watson T, MD   15 mg at 04/19/22 2106   ziprasidone (GEODON) injection 20 mg  20 mg Intramuscular Q12H PRN Riker Collier, [name], MD   20 mg at 03/19/22 1551    Lab Results: No results found for this or any previous visit (from the past 48 hour(s)).  Blood Alcohol level:  Lab Results  Component Value Date   ETH <10 07/20/2021    Metabolic Disorder Labs: Lab Results  Component Value Date   HGBA1C 5.5 04/10/2022   MPG 111.15 04/10/2022   No results found for: PROLACTIN Lab Results  Component Value Date   CHOL 167 11/20/2021   TRIG 107 11/20/2021   HDL 26 (L) 11/20/2021   CHOLHDL 6.4 11/20/2021   VLDL 21 11/20/2021   LDLCALC 120 (H) 11/20/2021    Physical Findings: AIMS:  , ,  ,  ,    CIWA:    COWS:     Musculoskeletal: Strength & Muscle Tone: within normal limits Gait & Station: normal Patient leans: N/A  Psychiatric Specialty Exam:  Presentation  General Appearance: Disheveled  Eye Contact:Fleeting  Speech:Garbled  Speech Volume:Decreased  Handedness:Right   Mood and Affect  Mood:Anxious  Affect:Constricted   Thought Process  Thought Processes:Irrevelant  Descriptions of Associations:Loose  Orientation:None  Thought Content:Illogical  History of Schizophrenia/Schizoaffective disorder:Yes  Duration of Psychotic Symptoms:Greater than six months  Hallucinations:No data recorded Ideas of Reference:Paranoia  Suicidal Thoughts:No data recorded Homicidal Thoughts:No data recorded  Sensorium  Memory:Immediate Poor; Remote Poor  Judgment:Impaired  Insight:Lacking   Executive Functions   Concentration:Poor  Attention Span:Poor  Recall:Poor  Fund of Knowledge:Poor  Language:Poor   Psychomotor Activity  Psychomotor Activity:No data recorded  Assets  Assets:Housing; Resilience; Social Support; Talents/Skills   Sleep  Sleep:No data recorded   Physical Exam: Physical Exam Vitals and nursing note reviewed.  Constitutional:      Appearance: Normal appearance.  HENT:     Head: Normocephalic and atraumatic.     Mouth/Throat:     Pharynx: Oropharynx is clear.  Eyes:  Pupils: Pupils are equal, round, and reactive to light.  Cardiovascular:     Rate and Rhythm: Normal rate and regular rhythm.  Pulmonary:     Effort: Pulmonary effort is normal.     Breath sounds: Normal breath sounds.  Abdominal:     General: Abdomen is flat.     Palpations: Abdomen is soft.  Musculoskeletal:        General: Normal range of motion.  Skin:    General: Skin is warm and dry.  Neurological:     General: No focal deficit present.     Mental Status: He is alert. Mental status is at baseline.  Psychiatric:        Attention and Perception: He is inattentive.        Mood and Affect: Mood normal. Affect is inappropriate.        Speech: He is noncommunicative.   Review of Systems  Psychiatric/Behavioral:  Negative for depression.   Blood pressure 96/61, pulse 100, temperature 97.7 F (36.5 C), temperature source Oral, resp. rate 17, height 5\' 8"  (1.727 m), weight 85.8 kg, SpO2 100 %. Body mass index is 28.76 kg/m.   Treatment Plan Summary: Daily contact with patient to assess and evaluate symptoms and progress in treatment and Plan no change to medication.  Ongoing daily assessment and evaluation.  , MD 04/20/2022, 3:19 PM

## 2022-04-20 NOTE — Progress Notes (Signed)
Patient took the Klonopin out of the medication cup, once again, during evening med pass. This Probation officer asked patient why he didn't want to take the medicine and all he said was "no". Patient did take all other scheduled medication. MD was notified.

## 2022-04-20 NOTE — Progress Notes (Signed)
Pt on 1:1 for safety with a sitter present. Pt calm and pleasant. Pt compliant with medication administration per MD orders. Pt has been asleep since 10pm. Pt remains safe on the unit.

## 2022-04-20 NOTE — Progress Notes (Signed)
Patient continues to motion to his chest, asking for his bra. Staff has tried to explain to patient that we do not know where the bra is and that he has been given multiple up through this point. It is believed that patient has thrown them away, which is why staff are making it clear that patient cant keep getting these items, that are donated. Patient was upset and walked back to his room.

## 2022-04-21 MED ORDER — CLONAZEPAM 1 MG PO TABS
1.0000 mg | ORAL_TABLET | Freq: Three times a day (TID) | ORAL | Status: DC | PRN
  Administered 2022-04-29 – 2023-02-15 (×60): 1 mg via ORAL
  Filled 2022-04-21 (×66): qty 1

## 2022-04-21 NOTE — Progress Notes (Signed)
Pt has been sleeping. Sitter is near by. Pt is safe.  Torrie Mayers RN

## 2022-04-21 NOTE — Progress Notes (Signed)
Pt started yelling and and charged out of the back hall to sit in front of the medication room.Sklyee RN was standing beside of him in the chair when I went into the med room to get some PRN meds. He charged at me but never attacked. He walked back to the back dayroom and took his meds. Torrie Mayers RN

## 2022-04-21 NOTE — Progress Notes (Signed)
Pt is on 1:1 for safety with a sitter present. Pt compliant with medication administration per MD orders. Pt remains safe on the unit.

## 2022-04-21 NOTE — Progress Notes (Signed)
Pt has become calmer. He had dinner. Torrie Mayers RN

## 2022-04-21 NOTE — Progress Notes (Signed)
Pt is calm and cooperative. Sitter is near. Torrie Mayers RN

## 2022-04-21 NOTE — Progress Notes (Signed)
Pt is still sleeping. Pt has a sitter near by. Pt is safe. Collier [name] RN

## 2022-04-21 NOTE — Progress Notes (Signed)
Pt has sitter at side. Pt has had lunch. Pt is safe. Torrie Mayers RN

## 2022-04-21 NOTE — Progress Notes (Signed)
[facility] MD Progress Note  04/21/2022 1:33 PM Adam [name]  MRN:  [id number] Subjective: Follow-up patient with schizophrenia and brain injury.   Adam [name] is calm and interactive with gestures.  Adam [name] expressed that Adam [name] doesn't like the "green pill".  The sitter and RN confirmed that pt has been picking out the clonazepam (which can be green color) of the pill cup.    Per MAR;  5/18: pt refused klonopin dose at 1205.  5/19: pt refused klonopin dose at 0849 and 1636, and only took the dose at 1227.  5/20: pt refused klonopin at 1058 so far.   No other new complaints.  Behavior stable and calm.  Able to come out into the milieu every day.  No dangerous behavior.    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 No pertinent surgical history. Family History: History reviewed. No pertinent family history. Family Psychiatric  History: See previous Social History:  Social History   Substance and Sexual Activity  Alcohol Use Not Currently     Social History   Substance and Sexual Activity  Drug Use Not Currently    Social History   Socioeconomic History   Marital status: Single    Spouse name: Not on file   Number of children: Not on file   Years of education: Not on file   Highest education level: Not on file  Occupational History   Not on file  Tobacco Use   Smoking status:  Never    Passive exposure: Never   Smokeless tobacco: Never  Vaping Use   Vaping Use: Unknown  Substance and Sexual Activity   Alcohol use: Not Currently   Drug use: Not Currently   Sexual activity: Not Currently  Other Topics Concern   Not on file  Social History Narrative   Not on file   Social Determinants of Health   Financial Resource Strain: Not on file  Food Insecurity: Not on file  Transportation Needs: Not on file  Physical Activity: Not on file  Stress: Not on file  Social Connections: Not on file   Additional Social History:    Sleep: Fair  Appetite:  Fair  Current Medications: Current Facility-Administered Medications  Medication Dose Route Frequency Provider Last Rate Last Admin   acetaminophen (TYLENOL) tablet 650 mg  650 mg Oral Q6H PRN [name], [name], MD   650 mg at 04/12/22 0136   alum & mag hydroxide-simeth (MAALOX/MYLANTA) 200-200-20 MG/5ML suspension 30 mL  30 mL Oral Q4H PRN [name], [name], MD   30 mL at 04/12/22 0136   aspirin EC tablet 81 mg  81 mg Oral Daily [name], [name], MD   81 mg at 04/21/22 1049   atorvastatin (LIPITOR) tablet 20 mg  20 mg Oral Daily [name], [name], MD   20 mg at 04/21/22 1050   atropine 1 % ophthalmic solution 1 drop  1 drop Sublingual QID [name], MD  1 drop at 04/21/22 1049   benztropine (COGENTIN) tablet 0.5 mg  0.5 mg Oral BID [name], [name], MD   0.5 mg at 04/21/22 1051   clonazePAM (KLONOPIN) tablet 1 mg  1 mg Oral TID [name], [name], MD   1 mg at 04/20/22 1227   cloZAPine (CLOZARIL) tablet 300 mg  300 mg Oral QHS [name], [name], MD   300 mg at 04/20/22 2103   diphenhydrAMINE (BENADRYL) capsule 50 mg  50 mg Oral Q6H PRN [name], [name], MD   50 mg at 04/18/22 1516   Or   diphenhydrAMINE (BENADRYL) injection 50 mg  50 mg Intramuscular Q6H PRN [name], [name], MD       gabapentin (NEURONTIN) capsule 300 mg  300 mg Oral TID [name], [name], MD   300 mg at 04/21/22 1050   haloperidol (HALDOL) tablet 5 mg  5 mg  Oral TID [name], [name], MD   5 mg at 04/21/22 1052   haloperidol (HALDOL) tablet 5 mg  5 mg Oral Q6H PRN [name], [name], MD   5 mg at 04/18/22 1516   Or   haloperidol lactate (HALDOL) injection 5 mg  5 mg Intramuscular Q6H PRN [name], [name], MD       magnesium hydroxide (MILK OF MAGNESIA) suspension 30 mL  30 mL Oral Daily PRN [name], [name], MD   30 mL at 04/20/22 1411   metoprolol succinate (TOPROL-XL) 24 hr tablet 25 mg  25 mg Oral Daily [name], [name], MD   25 mg at 04/21/22 1050   paliperidone (INVEGA SUSTENNA) injection 156 mg  156 mg Intramuscular Q28 days [name], [name], MD   156 mg at 03/29/22 1445   temazepam (RESTORIL) capsule 15 mg  15 mg Oral QHS [name], [name], MD   15 mg at 04/20/22 2103   ziprasidone (GEODON) injection 20 mg  20 mg Intramuscular Q12H PRN [name], [name], MD   20 mg at 03/19/22 1551    Lab Results: No results found for this or any previous visit (from the past 48 hour(s)).  Blood Alcohol level:  Lab Results  Component Value Date   ETH <10 07/20/2021    Metabolic Disorder Labs: Lab Results  Component Value Date   HGBA1C 5.5 04/10/2022   MPG 111.15 04/10/2022   No results found for: PROLACTIN Lab Results  Component Value Date   CHOL 167 11/20/2021   TRIG 107 11/20/2021   HDL 26 (L) 11/20/2021   CHOLHDL 6.4 11/20/2021   VLDL 21 11/20/2021   LDLCALC 120 (H) 11/20/2021    Physical Findings: AIMS:  , ,  ,  ,    CIWA:    COWS:     Musculoskeletal: Strength & Muscle Tone: within normal limits Gait & Station: normal Patient leans: N/A  Psychiatric Specialty Exam:  Presentation  General Appearance: Disheveled  Eye Contact:Fleeting  Speech:Garbled  Speech Volume:Decreased  Handedness:Right   Mood and Affect  Mood:Anxious  Affect:Constricted   Thought Process  Thought Processes:Irrevelant  Descriptions of Associations:Loose  Orientation:None  Thought Content:Illogical  History of Schizophrenia/Schizoaffective  disorder:Yes  Duration of Psychotic Symptoms:Greater than six months  Hallucinations:No data recorded Ideas of Reference:Paranoia  Suicidal Thoughts:No data recorded Homicidal Thoughts:No data recorded  Sensorium  Memory:Immediate Poor; Remote Poor  Judgment:Impaired  Insight:Lacking   Executive Functions  Concentration:Poor  Attention Span:Poor  Recall:Poor  Fund of Knowledge:Poor  Language:Poor   Psychomotor Activity  Psychomotor Activity:No data recorded  Assets  Assets:Housing; Resilience; Social Support; Talents/Skills   Sleep  Sleep:No data recorded   Physical Exam: Physical Exam Vitals and nursing note reviewed.  Constitutional:      Appearance: Normal appearance.  HENT:     Head: Normocephalic and atraumatic.     Mouth/Throat:     Pharynx: Oropharynx is clear.  Eyes:     Pupils: Pupils are equal, round, and reactive to light.  Cardiovascular:     Rate and Rhythm: Normal rate and regular rhythm.  Pulmonary:     Effort: Pulmonary effort is normal.     Breath sounds: Normal breath sounds.  Abdominal:     General: Abdomen is flat.     Palpations: Abdomen is soft.  Musculoskeletal:        General: Normal range of motion.  Skin:    General: Skin is warm and dry.  Neurological:     General: No focal deficit present.     Mental Status: Adam [name] is alert. Mental status is at baseline.  Psychiatric:        Attention and Perception: Adam [name] is inattentive.        Mood and Affect: Mood normal. Affect is inappropriate.        Speech: Adam [name] is noncommunicative.   Review of Systems  Psychiatric/Behavioral:  Negative for depression.   Blood pressure (!) 149/107, pulse 98, temperature 97.7 F (36.5 C), temperature source Oral, resp. rate 17, height 5\' 8"  (1.727 m), weight 85.8 kg, SpO2 100 %. Body mass index is 28.76 kg/m.   Treatment Plan Summary: Daily contact with patient to assess and evaluate symptoms and progress in treatment and Medication  management  Impulse control disorder, from TBI -- continue Clozapine 300mg  qhs. ( Pt is drooling).  -- continue haloperidol 5mg  po TID -- pt has been receiving 156mg  IM q28 days. Last dose given on 03/29/22 -- continue benztropine 0.5mg  po BID -- continue gabapentin 300mg  TID -- will change Clonazepam 1mg  TID to TID PRN.  -- continue Temazepam 15mg  po QHS  Schizophrenia, Cognitive impairment, r/o Dementia.  -- his behaviors seem to be somewhat stable, needs safety placement which primary team has been working on.  -- pt is already on 3 concurrent antipsychotics.  -- monitor side effects from polypharmacy.    Adam Pankowski, MD 04/21/2022, 1:33 PM

## 2022-04-21 NOTE — Plan of Care (Signed)
Pt denies depression, anxiety, SI, HI and AVH. Pt was educated on care plan and verbalizes understanding. Torrie Mayers RN Problem: Safety: Goal: Violent Restraint(s) Outcome: Progressing   Problem: Education: Goal: Ability to state activities that reduce stress will improve Outcome: Progressing   Problem: Coping: Goal: Ability to identify and develop effective coping behavior will improve Outcome: Progressing   Problem: Self-Concept: Goal: Ability to identify factors that promote anxiety will improve Outcome: Progressing Goal: Level of anxiety will decrease Outcome: Progressing Goal: Ability to modify response to factors that promote anxiety will improve Outcome: Progressing

## 2022-04-22 NOTE — Progress Notes (Signed)
8 pm/ Pt calm and quiet with 1:1 sitter. No signs of emotional distress. Gait steady. 9 pm /Pt eating snack in the dining room. 1:1 with sitter and calm/quiet. 10 pm/Pt in room listening to music, 1:1 with sitter and no signs of emotional distress. 11 pm/Pt in room listening ot music, 1;1 with sister. Pt quiet and calm. 12 am/Pt in bed awake, quiet/calm with 1:1 at bedside. 1 am/Pt asleep and 1:1 sitter present. 2 am/Pt asleep and 1:1 sitter present. 3 am/Pt asleep in bed. 4 am/Pt asleep and 1:1 sitter at bedside. 5 am/Pt asleep and 1:1 sitter present. 6 am/Pt awake and putting on his clothes. 1:1 sitter present. Pt calm and quiet.

## 2022-04-22 NOTE — Progress Notes (Signed)
Patient is now awake and receiving his morning medication. MD will be notified.

## 2022-04-22 NOTE — Progress Notes (Signed)
Patient is still sleeping, so this writer will administer scheduled medications when he wakes up. MD will be notified.

## 2022-04-22 NOTE — Group Note (Signed)
LCSW Group Therapy Note  Group Date: 04/21/2022 Start Time: 1430 End Time: 1530   Type of Therapy and Topic:  Group Therapy - Healthy vs Unhealthy Coping Skills  Participation Level:  Did Not Attend   Description of Group The focus of this group was to determine what unhealthy coping techniques typically are used by group members and what healthy coping techniques would be helpful in coping with various problems. Patients were guided in becoming aware of the differences between healthy and unhealthy coping techniques. Patients were asked to identify 2-3 healthy coping skills they would like to learn to use more effectively.  Therapeutic Goals Patients learned that coping is what human beings do all day long to deal with various situations in their lives Patients defined and discussed healthy vs unhealthy coping techniques Patients identified their preferred coping techniques and identified whether these were healthy or unhealthy Patients determined 2-3 healthy coping skills they would like to become more familiar with and use more often. Patients provided support and ideas to each other   Summary of Patient Progress: Patient was present for the entirety of the group session. Patient was an active listener and participated in the topic of discussion, provided helpful advice to others, and added nuance to topic of conversation.   Therapeutic Modalities Cognitive Behavioral Therapy Motivational Interviewing  [name], [place] 04/22/2022  8:30 AM

## 2022-04-22 NOTE — Progress Notes (Signed)
[facility] MD Progress Note  04/22/2022 2:21 PM Adam [name]  MRN:  [id number] Subjective: Follow-up patient with schizophrenia and brain injury.   Adam [name] is doing well, cooperative, and calm, and has not needed any prn klonopin now.   Per MAR;  5/18: pt refused klonopin dose at 1205.  5/19: pt refused klonopin dose at 0849 and 1636, and only took the dose at 1227.  5/20: pt refused klonopin at 1058 so far.   No other new complaints.  Behavior stable and calm.  Able to come out into the milieu every day.  No dangerous behavior.    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 No pertinent surgical history. Family History: History reviewed. No pertinent family history. Family Psychiatric  History: See previous Social History:  Social History   Substance and Sexual Activity  Alcohol Use Not Currently     Social History   Substance and Sexual Activity  Drug Use Not Currently    Social History   Socioeconomic History   Marital status: Single    Spouse name: Not on file   Number of children: Not on file   Years of education: Not on file   Highest education level: Not on file  Occupational History   Not on file  Tobacco Use   Smoking status: Never    Passive exposure: Never   Smokeless tobacco: Never  Vaping Use   Vaping Use: Unknown  Substance and Sexual Activity    Alcohol use: Not Currently   Drug use: Not Currently   Sexual activity: Not Currently  Other Topics Concern   Not on file  Social History Narrative   Not on file   Social Determinants of Health   Financial Resource Strain: Not on file  Food Insecurity: Not on file  Transportation Needs: Not on file  Physical Activity: Not on file  Stress: Not on file  Social Connections: Not on file   Additional Social History:    Sleep: Fair  Appetite:  Fair  Current Medications: Current Facility-Administered Medications  Medication Dose Route Frequency Provider Last Rate Last Admin   acetaminophen (TYLENOL) tablet 650 mg  650 mg Oral Q6H PRN [name], [name], MD   650 mg at 04/12/22 0136   alum & mag hydroxide-simeth (MAALOX/MYLANTA) 200-200-20 MG/5ML suspension 30 mL  30 mL Oral Q4H PRN [name], [name], MD   30 mL at 04/12/22 0136   aspirin EC tablet 81 mg  81 mg Oral Daily [name], [name], MD   81 mg at 04/22/22 0957   atorvastatin (LIPITOR) tablet 20 mg  20 mg Oral Daily [name], [name], MD   20 mg at 04/22/22 0957   atropine 1 % ophthalmic solution 1 drop  1 drop Sublingual QID [name], MD   1 drop at 04/22/22 0957   benztropine (COGENTIN) tablet 0.5 mg  0.5 mg Oral BID [name], [name], MD   0.5  mg at 04/22/22 0957   clonazePAM (KLONOPIN) tablet 1 mg  1 mg Oral TID PRN Antonia Jicha, MD       cloZAPine (CLOZARIL) tablet 300 mg  300 mg Oral QHS [name], [name], MD   300 mg at 04/21/22 2106   diphenhydrAMINE (BENADRYL) capsule 50 mg  50 mg Oral Q6H PRN [name], [name], MD   50 mg at 04/21/22 1538   Or   diphenhydrAMINE (BENADRYL) injection 50 mg  50 mg Intramuscular Q6H PRN [name], [name], MD       gabapentin (NEURONTIN) capsule 300 mg  300 mg Oral TID [name], [name], MD   300 mg at 04/22/22 0957   haloperidol (HALDOL) tablet 5 mg  5 mg Oral TID [name], [name], MD   5 mg at 04/22/22 0957   haloperidol (HALDOL) tablet 5 mg  5 mg Oral Q6H PRN [name], [name], MD   5 mg at 04/21/22 1538    Or   haloperidol lactate (HALDOL) injection 5 mg  5 mg Intramuscular Q6H PRN [name], [name], MD       magnesium hydroxide (MILK OF MAGNESIA) suspension 30 mL  30 mL Oral Daily PRN [name], [name], MD   30 mL at 04/20/22 1411   metoprolol succinate (TOPROL-XL) 24 hr tablet 25 mg  25 mg Oral Daily [name], [name], MD   25 mg at 04/22/22 0957   paliperidone (INVEGA SUSTENNA) injection 156 mg  156 mg Intramuscular Q28 days [name], [name], MD   156 mg at 03/29/22 1445   temazepam (RESTORIL) capsule 15 mg  15 mg Oral QHS [name], [name], MD   15 mg at 04/21/22 2106   ziprasidone (GEODON) injection 20 mg  20 mg Intramuscular Q12H PRN [name], [name], MD   20 mg at 03/19/22 1551    Lab Results: No results found for this or any previous visit (from the past 48 hour(s)).  Blood Alcohol level:  Lab Results  Component Value Date   ETH <10 07/20/2021    Metabolic Disorder Labs: Lab Results  Component Value Date   HGBA1C 5.5 04/10/2022   MPG 111.15 04/10/2022   No results found for: PROLACTIN Lab Results  Component Value Date   CHOL 167 11/20/2021   TRIG 107 11/20/2021   HDL 26 (L) 11/20/2021   CHOLHDL 6.4 11/20/2021   VLDL 21 11/20/2021   LDLCALC 120 (H) 11/20/2021    Physical Findings: AIMS:  , ,  ,  ,    CIWA:    COWS:     Musculoskeletal: Strength & Muscle Tone: within normal limits Gait & Station: normal Patient leans: N/A  Psychiatric Specialty Exam:  Presentation  General Appearance: Disheveled  Eye Contact:Fleeting  Speech:Garbled  Speech Volume:Decreased  Handedness:Right   Mood and Affect  Mood:Anxious  Affect:Constricted   Thought Process  Thought Processes:Irrevelant  Descriptions of Associations:Loose  Orientation:None  Thought Content:Illogical  History of Schizophrenia/Schizoaffective disorder:Yes  Duration of Psychotic Symptoms:Greater than six months  Hallucinations:No data recorded Ideas of Reference:Paranoia  Suicidal  Thoughts:No data recorded Homicidal Thoughts:No data recorded  Sensorium  Memory:Immediate Poor; Remote Poor  Judgment:Impaired  Insight:Lacking   Executive Functions  Concentration:Poor  Attention Span:Poor  Recall:Poor  Fund of Knowledge:Poor  Language:Poor   Psychomotor Activity  Psychomotor Activity:No data recorded  Assets  Assets:Housing; Resilience; Social Support; Talents/Skills   Sleep  Sleep:No data recorded   Physical Exam: Physical Exam Vitals and nursing note reviewed.  Constitutional:  Appearance: Normal appearance.  HENT:     Head: Normocephalic and atraumatic.     Mouth/Throat:     Pharynx: Oropharynx is clear.  Eyes:     Pupils: Pupils are equal, round, and reactive to light.  Cardiovascular:     Rate and Rhythm: Normal rate and regular rhythm.  Pulmonary:     Effort: Pulmonary effort is normal.     Breath sounds: Normal breath sounds.  Abdominal:     General: Abdomen is flat.     Palpations: Abdomen is soft.  Musculoskeletal:        General: Normal range of motion.  Skin:    General: Skin is warm and dry.  Neurological:     General: No focal deficit present.     Mental Status: Cartha Rotert is alert. Mental status is at baseline.  Psychiatric:        Attention and Perception: Lamere Lightner is inattentive.        Mood and Affect: Mood normal. Affect is inappropriate.        Speech: Krizia Flight is noncommunicative.   Review of Systems  Psychiatric/Behavioral:  Negative for depression.   Blood pressure 131/70, pulse 100, temperature 97.6 F (36.4 C), temperature source Oral, resp. rate 18, height 5\' 8"  (1.727 m), weight 85.8 kg, SpO2 100 %. Body mass index is 28.76 kg/m.   Treatment Plan Summary: Daily contact with patient to assess and evaluate symptoms and progress in treatment and Medication management  Impulse control disorder, from TBI -- continue Clozapine 300mg  qhs. ( Pt is drooling).  -- continue haloperidol 5mg  po TID -- pt has been receiving  Luvenia Starch 156mg  IM q28 days. Last dose given on 03/29/22 -- continue benztropine 0.5mg  po BID -- continue gabapentin 300mg  TID -- will change Clonazepam 1mg  TID to TID PRN.  -- continue Temazepam 15mg  po QHS  Schizophrenia, Cognitive impairment, r/o Dementia.  -- his behaviors seem to be somewhat stable, needs safety placement which primary team has been working on.  -- pt is already on 3 concurrent antipsychotics.  -- monitor side effects from polypharmacy.    Remmy Crass, MD 04/22/2022, 2:21 PM

## 2022-04-22 NOTE — Progress Notes (Signed)
1:1 Patient Hourly Rounding  0800: Patient is in bed, asleep, with his assigned safety sitter present.  0900: Patient continues to sleep, in bed, with his assigned safety sitter present.  1000: Patient in in the dayroom, on the back hall, eating breakfast with his assigned safety sitter present. Patient is also allowing this writer to obtain a set of vitals, as well as getting his morning medication.  1100: Patient is in the back dayroom, talking to the doctor, with his assigned safety sitter present.  1200: Patient is in the dayroom, on the back hall, with his assigned safety sitter present.  1300: Patient is in his bathroom, shaving, with his assigned safety sitter present.  1400: Patient is in the dayroom, on the back hall, watching football with his assigned safety sitter present.  1500: Patient is in the dayroom, on the back hall, watching football, with his assigned safety sitter present.  1600: Patient is outside, in the courtyard, with staff and other members on the unit. Patient's assigned safety sitter is also present.  1700: Patient is down in the main dayroom, eating dinner, with his assigned safety sitter present.  1800: Patient is in the dayroom, on the back hall, with his assigned safety sitter present.  1900: Patient is in his room, awake, with his assigned safety sitter present.

## 2022-04-22 NOTE — Plan of Care (Signed)
D- Patient alert and oriented to self and situation. Patient presents in a pleasant mood on assessment stating that he slept good last night and had no complaints to voice to this Clinical research associate. Patient has expressive aphasia and is childlike. This writer is unable to fully assess if patient is experiencing any SI, HI/AVH, but when this Clinical research associate asked patient, he responded by saying "no". Patient also denies any signs/symptoms of depression, anxiety and pain at this time. Patient had no stated goals for today, however, he is fixated on Cleopatra, and has been for the past couple of days.  A- Scheduled medications administered to patient, per MD orders. Support and encouragement provided.  Routine safety checks conducted every 15 minutes.  Patient informed to notify staff with problems or concerns.  R- No adverse drug reactions noted. Patient contracts for safety at this time. Patient compliant with medications and treatment plan. Patient receptive, calm, and cooperative. Patient interacts well with others on the unit. Patient remains safe at this time.  Problem: Safety: Goal: Violent Restraint(s) Outcome: Progressing   Problem: Education: Goal: Ability to state activities that reduce stress will improve Outcome: Progressing   Problem: Coping: Goal: Ability to identify and develop effective coping behavior will improve Outcome: Progressing   Problem: Self-Concept: Goal: Ability to identify factors that promote anxiety will improve Outcome: Progressing Goal: Level of anxiety will decrease Outcome: Progressing Goal: Ability to modify response to factors that promote anxiety will improve Outcome: Progressing

## 2022-04-22 NOTE — Progress Notes (Signed)
After speaking to MD, this writer is going to hold patient's noon medication. This is due to patient not receiving his scheduled morning doses at 0957.

## 2022-04-23 LAB — CBC WITH DIFFERENTIAL/PLATELET
Abs Immature Granulocytes: 0.01 10*3/uL (ref 0.00–0.07)
Basophils Absolute: 0.1 10*3/uL (ref 0.0–0.1)
Basophils Relative: 2 %
Eosinophils Absolute: 0.1 10*3/uL (ref 0.0–0.5)
Eosinophils Relative: 2 %
HCT: 41.9 % (ref 39.0–52.0)
Hemoglobin: 13.9 g/dL (ref 13.0–17.0)
Immature Granulocytes: 0 %
Lymphocytes Relative: 25 %
Lymphs Abs: 1.1 10*3/uL (ref 0.7–4.0)
MCH: 29.6 pg (ref 26.0–34.0)
MCHC: 33.2 g/dL (ref 30.0–36.0)
MCV: 89.3 fL (ref 80.0–100.0)
Monocytes Absolute: 0.5 10*3/uL (ref 0.1–1.0)
Monocytes Relative: 11 %
Neutro Abs: 2.8 10*3/uL (ref 1.7–7.7)
Neutrophils Relative %: 60 %
Platelets: 184 10*3/uL (ref 150–400)
RBC: 4.69 MIL/uL (ref 4.22–5.81)
RDW: 13.2 % (ref 11.5–15.5)
WBC: 4.6 10*3/uL (ref 4.0–10.5)
nRBC: 0 % (ref 0.0–0.2)

## 2022-04-23 NOTE — Progress Notes (Signed)
Pt denies SIHI and AVH. Visible on the unit, and 1:1 with sitter. No loud outburst or yelling. Cooperative with medications. He has been listening to his music while awake and slept well last night. He has difficulty communicating, due a past stroke and TBI.

## 2022-04-23 NOTE — Progress Notes (Signed)
Pt in room asleep in bed, and sitter present.

## 2022-04-23 NOTE — Plan of Care (Signed)
Patient pleasant and appropriate with staff & peers. ADLs maintained. Safety maintained with sitter.

## 2022-04-23 NOTE — Progress Notes (Signed)
Pt up, addressed and in the dayroom with his sitter. No signs of emotional distress and follow directions

## 2022-04-23 NOTE — Plan of Care (Signed)
  Problem: Safety: Goal: Violent Restraint(s) Outcome: Progressing   Problem: Education: Goal: Ability to state activities that reduce stress will improve Outcome: Progressing   Problem: Coping: Goal: Ability to identify and develop effective coping behavior will improve Outcome: Progressing   Problem: Self-Concept: Goal: Ability to identify factors that promote anxiety will improve Outcome: Progressing Goal: Level of anxiety will decrease Outcome: Progressing Goal: Ability to modify response to factors that promote anxiety will improve Outcome: Progressing

## 2022-04-23 NOTE — Progress Notes (Signed)
2000-Pt in dayroom with 1:1 sitter. He denies SI/HI and AVH. No reports of anxiety or depression. Pt smiling and laughing. Pt is still hard to understand at times, gait steady and he follows directions. No interaction with peers, but visible on the unit. 2100-Pt in bedroom, calm and quiet. 1:1 sitter present 2200-Pt in room, calm and quiet. 1:1 Sitter present.

## 2022-04-23 NOTE — TOC Progression Note (Signed)
Transition of Care [facility]) - Progression Note    Patient Details  Name: Adam [name] MRN: [id number] Date of Birth: [birth year]  [facility]) CM/SW Contact  [name] Cellar, RN Phone Number: 04/23/2022, 1:12 PM  Clinical Narrative:    Updated progress notes sent to [facility] requested review from [facility] due to previous discussion around possible admission depending on patients elopment risk. Much discussion was had and psychiatry team did not feel as though patient was an elevated elopment risk. Will await outcome from [facility] review.         Expected Discharge Plan and Services                                                 Social Determinants of Health (SDOH) Interventions    Readmission Risk Interventions     View : No data to display.

## 2022-04-23 NOTE — Progress Notes (Signed)
[facility] MD Progress Note no new complaints.  No change in behavior.  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 No pertinent surgical history. Family History: History reviewed. No pertinent family history. Family Psychiatric  History: See previous Social History:  Social History   Substance and Sexual Activity  Alcohol Use Not Currently     Social History   Substance and Sexual Activity  Drug Use Not Currently    Social History   Socioeconomic History   Marital status: Single    Spouse name: Not on file   Number of children: Not on file   Years of education: Not on file   Highest education level: Not on file  Occupational History   Not on file  Tobacco Use   Smoking status: Never    Passive exposure: Never   Smokeless tobacco: Never  Vaping Use   Vaping Use: Unknown  Substance and Sexual Activity   Alcohol use: Not Currently   Drug use: Not Currently   Sexual activity: Not Currently  Other Topics Concern   Not on file  Social History Narrative   Not on file   Social Determinants of Health   Financial Resource Strain: Not on file  Food Insecurity: Not on file  Transportation Needs: Not on file  Physical Activity: Not on file  Stress: Not  on file  Social Connections: Not on file   Additional Social History:                         Sleep: Fair  Appetite:  Fair  Current Medications: Current Facility-Administered Medications  Medication Dose Route Frequency Provider Last Rate Last Admin   acetaminophen (TYLENOL) tablet 650 mg  650 mg Oral Q6H PRN Cathyann Kilfoyle T, MD   650 mg at 04/12/22 0136   alum & mag hydroxide-simeth (MAALOX/MYLANTA) 200-200-20 MG/5ML suspension 30 mL  30 mL Oral Q4H PRN Broc Caspers T, MD   30 mL at 04/12/22 0136   aspirin EC tablet 81 mg  81 mg Oral Daily Lenaya Pietsch T, MD   81 mg at 04/23/22 0757   atorvastatin (LIPITOR) tablet 20 mg  20 mg Oral Daily Myrle Wanek T, MD   20 mg at 04/23/22 0757   atropine 1 % ophthalmic solution 1 drop  1 drop Sublingual QID [name], MD   1 drop at 04/23/22 0758   benztropine (COGENTIN) tablet 0.5 mg  0.5 mg Oral BID Umaima Scholten T, MD   0.5 mg at 04/23/22 0758   clonazePAM (KLONOPIN) tablet 1 mg  1 mg Oral TID PRN He, [name], MD       cloZAPine (CLOZARIL) tablet 300 mg  300 mg Oral QHS Brinlyn Cena T, MD   300 mg at 04/22/22 2228   diphenhydrAMINE (BENADRYL) capsule  50 mg  50 mg Oral Q6H PRN Quest Tavenner, [name], MD   50 mg at 04/21/22 1538   Or   diphenhydrAMINE (BENADRYL) injection 50 mg  50 mg Intramuscular Q6H PRN Alyce Inscore, [name], MD       gabapentin (NEURONTIN) capsule 300 mg  300 mg Oral TID Shealynn Saulnier, [name], MD   300 mg at 04/23/22 0758   haloperidol (HALDOL) tablet 5 mg  5 mg Oral TID Harshitha Fretz, [name], MD   5 mg at 04/23/22 0758   haloperidol (HALDOL) tablet 5 mg  5 mg Oral Q6H PRN Keanthony Poole, [name], MD   5 mg at 04/21/22 1538   Or   haloperidol lactate (HALDOL) injection 5 mg  5 mg Intramuscular Q6H PRN Worthy Boschert, [name], MD       magnesium hydroxide (MILK OF MAGNESIA) suspension 30 mL  30 mL Oral Daily PRN Tyeasha Ebbs T, MD   30 mL at 04/20/22 1411   metoprolol succinate (TOPROL-XL) 24 hr tablet 25 mg  25 mg Oral Daily Marilee Ditommaso T, MD   25 mg  at 04/23/22 0758   paliperidone (INVEGA SUSTENNA) injection 156 mg  156 mg Intramuscular Q28 days Karsyn Jamie T, MD   156 mg at 03/29/22 1445   temazepam (RESTORIL) capsule 15 mg  15 mg Oral QHS Rionna Feltes T, MD   15 mg at 04/22/22 2228   ziprasidone (GEODON) injection 20 mg  20 mg Intramuscular Q12H PRN Arnice Vanepps, [name], MD   20 mg at 03/19/22 1551    Lab Results:  Results for orders placed or performed during the hospital encounter of 03/19/22 (from the past 48 hour(s))  CBC with Differential/Platelet     Status: None   Collection Time: 04/23/22 11:03 AM  Result Value Ref Range   WBC 4.6 4.0 - 10.5 K/uL   RBC 4.69 4.22 - 5.81 MIL/uL   Hemoglobin 13.9 13.0 - 17.0 g/dL   HCT 13.2 44.0 - 10.2 %   MCV 89.3 80.0 - 100.0 fL   MCH 29.6 26.0 - 34.0 pg   MCHC 33.2 30.0 - 36.0 g/dL   RDW 72.5 36.6 - 44.0 %   Platelets 184 150 - 400 K/uL   nRBC 0.0 0.0 - 0.2 %   Neutrophils Relative % 60 %   Neutro Abs 2.8 1.7 - 7.7 K/uL   Lymphocytes Relative 25 %   Lymphs Abs 1.1 0.7 - 4.0 K/uL   Monocytes Relative 11 %   Monocytes Absolute 0.5 0.1 - 1.0 K/uL   Eosinophils Relative 2 %   Eosinophils Absolute 0.1 0.0 - 0.5 K/uL   Basophils Relative 2 %   Basophils Absolute 0.1 0.0 - 0.1 K/uL   Immature Granulocytes 0 %   Abs Immature Granulocytes 0.01 0.00 - 0.07 K/uL    Comment: Performed at [facility], [address] [place], [place] [address]    Blood Alcohol level:  Lab Results  Component Value Date   [facility] <10 07/20/2021    Metabolic Disorder Labs: Lab Results  Component Value Date   HGBA1C 5.5 04/10/2022   MPG 111.15 04/10/2022   No results found for: PROLACTIN Lab Results  Component Value Date   CHOL 167 11/20/2021   TRIG 107 11/20/2021   HDL 26 (L) 11/20/2021   CHOLHDL 6.4 11/20/2021   VLDL 21 11/20/2021   LDLCALC 120 (H) 11/20/2021    Physical Findings: AIMS:  , ,  ,  ,    CIWA:  COWS:     Musculoskeletal: Strength & Muscle Tone: within normal  limits Gait & Station: normal Patient leans: N/A  Psychiatric Specialty Exam:  Presentation  General Appearance: Disheveled  Eye Contact:Fleeting  Speech:Garbled  Speech Volume:Decreased  Handedness:Right   Mood and Affect  Mood:Anxious  Affect:Constricted   Thought Process  Thought Processes:Irrevelant  Descriptions of Associations:Loose  Orientation:None  Thought Content:Illogical  History of Schizophrenia/Schizoaffective disorder:Yes  Duration of Psychotic Symptoms:Greater than six months  Hallucinations:No data recorded Ideas of Reference:Paranoia  Suicidal Thoughts:No data recorded Homicidal Thoughts:No data recorded  Sensorium  Memory:Immediate Poor; Remote Poor  Judgment:Impaired  Insight:Lacking   Executive Functions  Concentration:Poor  Attention Span:Poor  Recall:Poor  Fund of Knowledge:Poor  Language:Poor   Psychomotor Activity  Psychomotor Activity:No data recorded  Assets  Assets:Housing; Resilience; Social Support; Talents/Skills   Sleep  Sleep:No data recorded   Physical Exam: Physical Exam Constitutional:      Appearance: Normal appearance.  HENT:     Head: Normocephalic and atraumatic.     Mouth/Throat:     Pharynx: Oropharynx is clear.  Eyes:     Pupils: Pupils are equal, round, and reactive to light.  Cardiovascular:     Rate and Rhythm: Normal rate and regular rhythm.  Pulmonary:     Effort: Pulmonary effort is normal.     Breath sounds: Normal breath sounds.  Abdominal:     General: Abdomen is flat.     Palpations: Abdomen is soft.  Musculoskeletal:        General: Normal range of motion.  Skin:    General: Skin is warm and dry.  Neurological:     General: No focal deficit present.     Mental Status: He is alert. Mental status is at baseline.  Psychiatric:        Mood and Affect: Mood normal.        Thought Content: Thought content normal.   ROS Blood pressure 132/89, pulse 90, temperature  97.8 F (36.6 C), temperature source Oral, resp. rate 18, height 5\' 8"  (1.727 m), weight 85.8 kg, SpO2 99 %. Body mass index is 28.76 kg/m.   Treatment Plan Summary: Plan no change to medication or treatment plan.  Continue hopefully working on some kind of discharge plan  Mordecai Rasmussen, MD 04/23/2022, 11:34 AM

## 2022-04-23 NOTE — Progress Notes (Signed)
08:00 Patient visible in the milieu with sitter. Appropriate with staff & peers.having breakfast in the day room.  10:00  Patient in & out of his room. Pleasant and cooperative on approach. Compliant with medications and meals.safety maintained with sitter.  12:00  Patient had lunch in the day room.No irritable behaviors noted.Sitter with patient.  14:00 Patient playing with his phone.Sitter with patient.

## 2022-04-23 NOTE — Plan of Care (Signed)
Pt denied SI/HI and AVH. Pt visible on the unit, minimal interaction with peer and staff. Pt has difficulty processing thoughts and communication due to a past stroke and TBI. Pt likes listening to music, playing with his phone and Ipad. He is taking his medications and cooperative. He said no to , questions about SI/HI and AVH.  Problem: Safety: Goal: Violent Restraint(s) Outcome: Progressing   Problem: Education: Goal: Ability to state activities that reduce stress will improve Outcome: Progressing   Problem: Coping: Goal: Ability to identify and develop effective coping behavior will improve Outcome: Progressing   Problem: Self-Concept: Goal: Ability to identify factors that promote anxiety will improve Outcome: Progressing Goal: Level of anxiety will decrease Outcome: Progressing Goal: Ability to modify response to factors that promote anxiety will improve Outcome: Progressing

## 2022-04-24 NOTE — Progress Notes (Signed)
Recreation Therapy Notes  Date: 04/24/2022   Time: 10:40am      Location: Craft room  Behavioral response: Appropriate   Intervention Topic: Relaxation   Discussion/Intervention:  Group content today was focused on relaxation. The group defined relaxation and identified healthy ways to relax. Individuals expressed how much time they spend relaxing. Patients expressed how much their life would be if they did not make time for themselves to relax. The group stated ways they could improve their relaxation techniques in the future.  Individuals participated in the intervention "Time to Relax" where they had a chance to experience different relaxation techniques.  Clinical Observations/Feedback: Patient came to group and was able to identify music and yoga as relaxation techniques. Individual was social with peers and staff while participating in the intervention.    Aundrea Higginbotham LRT/CTRS         Adam [name] 04/24/2022 12:20 PM

## 2022-04-24 NOTE — Plan of Care (Addendum)
D- Patient alert and oriented to person and situation. Patient presented in a slightly preoccupied, but pleasant mood on assessment stating that he slept good last night and had no complaints to voice to this Clinical research associate. Patient is childlike due to past brain trauma and has expressive aphasia. Patient does say a few words and repeats words that he hears others say. This writer is unable to fully assess if patient is experiencing SI, HI, AVH, but when this writer asked patient about any of this, he said "no". Patient does not appear to be in any pain nor acute distress. When this writer asked patient what his goals were for today, he stated "Cleopatra".  A- Scheduled medications administered to patient, per MD orders. Support and encouragement provided.  Routine safety checks conducted every 15 minutes.  Patient informed to notify staff with problems or concerns.  R- No adverse drug reactions noted. Patient contracts for safety at this time. Patient compliant with medications and treatment plan. Patient receptive, calm, and cooperative. Patient interacts well with others on the unit.  Patient remains safe at this time.  Problem: Safety: Goal: Violent Restraint(s) Outcome: Progressing   Problem: Education: Goal: Ability to state activities that reduce stress will improve Outcome: Progressing   Problem: Coping: Goal: Ability to identify and develop effective coping behavior will improve Outcome: Progressing   Problem: Self-Concept: Goal: Ability to identify factors that promote anxiety will improve Outcome: Progressing Goal: Level of anxiety will decrease Outcome: Progressing Goal: Ability to modify response to factors that promote anxiety will improve Outcome: Progressing

## 2022-04-24 NOTE — Progress Notes (Signed)
[facility] MD Progress Note  04/24/2022 12:26 PM Adam [name]  MRN:  [id number] Subjective: Follow-up 61 year old man with schizophrenia and chronic neurologic injury.  No new complaints.  Behavior is really quite good for the most part.  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 No pertinent surgical history. Family History: History reviewed. No pertinent family history. Family Psychiatric  History: See previous Social History:  Social History   Substance and Sexual Activity  Alcohol Use Not Currently     Social History   Substance and Sexual Activity  Drug Use Not Currently    Social History   Socioeconomic History   Marital status: Single    Spouse name: Not on file   Number of children: Not on file   Years of education: Not on file   Highest education level: Not on file  Occupational History   Not on file  Tobacco Use   Smoking status: Never    Passive exposure: Never   Smokeless tobacco: Never  Vaping Use   Vaping Use: Unknown  Substance and Sexual Activity   Alcohol use: Not Currently   Drug use: Not Currently   Sexual activity: Not Currently  Other Topics Concern   Not on file   Social History Narrative   Not on file   Social Determinants of Health   Financial Resource Strain: Not on file  Food Insecurity: Not on file  Transportation Needs: Not on file  Physical Activity: Not on file  Stress: Not on file  Social Connections: Not on file   Additional Social History:                         Sleep: Fair  Appetite:  Fair  Current Medications: Current Facility-Administered Medications  Medication Dose Route Frequency Provider Last Rate Last Admin   acetaminophen (TYLENOL) tablet 650 mg  650 mg Oral Q6H PRN Baneza Bartoszek T, MD   650 mg at 04/12/22 0136   alum & mag hydroxide-simeth (MAALOX/MYLANTA) 200-200-20 MG/5ML suspension 30 mL  30 mL Oral Q4H PRN Conswella Bruney T, MD   30 mL at 04/12/22 0136   aspirin EC tablet 81 mg  81 mg Oral Daily Alyene Predmore T, MD   81 mg at 04/24/22 1660   atorvastatin (LIPITOR) tablet 20 mg  20 mg Oral Daily Emonnie Cannady T, MD   20 mg at 04/24/22 0823   atropine 1 % ophthalmic solution 1 drop  1 drop Sublingual QID [name], MD   1 drop at 04/24/22 1202   benztropine (COGENTIN) tablet 0.5 mg  0.5 mg Oral BID Evangelyne Loja T, MD   0.5 mg at 04/24/22 0823   clonazePAM (KLONOPIN) tablet 1 mg  1 mg Oral TID  PRN He, [name], MD       cloZAPine (CLOZARIL) tablet 300 mg  300 mg Oral QHS Lycan Davee T, MD   300 mg at 04/23/22 2219   diphenhydrAMINE (BENADRYL) capsule 50 mg  50 mg Oral Q6H PRN Bren Borys, [name], MD   50 mg at 04/21/22 1538   Or   diphenhydrAMINE (BENADRYL) injection 50 mg  50 mg Intramuscular Q6H PRN Tamlyn Sides, [name], MD       gabapentin (NEURONTIN) capsule 300 mg  300 mg Oral TID Leinaala Catanese T, MD   300 mg at 04/24/22 1200   haloperidol (HALDOL) tablet 5 mg  5 mg Oral TID Winford Hehn, [name], MD   5 mg at 04/24/22 1200   haloperidol (HALDOL) tablet 5 mg  5 mg Oral Q6H PRN Gareld Obrecht, [name], MD   5 mg at 04/21/22 1538   Or   haloperidol lactate (HALDOL) injection 5 mg  5 mg Intramuscular Q6H PRN Allisson Schindel, [name],  MD       magnesium hydroxide (MILK OF MAGNESIA) suspension 30 mL  30 mL Oral Daily PRN Alexus Michael T, MD   30 mL at 04/20/22 1411   metoprolol succinate (TOPROL-XL) 24 hr tablet 25 mg  25 mg Oral Daily Sanda Dejoy T, MD   25 mg at 04/24/22 0823   paliperidone (INVEGA SUSTENNA) injection 156 mg  156 mg Intramuscular Q28 days Acquanetta Cabanilla T, MD   156 mg at 03/29/22 1445   temazepam (RESTORIL) capsule 15 mg  15 mg Oral QHS Raeshaun Simson T, MD   15 mg at 04/23/22 2219   ziprasidone (GEODON) injection 20 mg  20 mg Intramuscular Q12H PRN Sharni Negron, [name], MD   20 mg at 03/19/22 1551    Lab Results:  Results for orders placed or performed during the hospital encounter of 03/19/22 (from the past 48 hour(s))  CBC with Differential/Platelet     Status: None   Collection Time: 04/23/22 11:03 AM  Result Value Ref Range   WBC 4.6 4.0 - 10.5 K/uL   RBC 4.69 4.22 - 5.81 MIL/uL   Hemoglobin 13.9 13.0 - 17.0 g/dL   HCT 53.2 02.3 - 34.3 %   MCV 89.3 80.0 - 100.0 fL   MCH 29.6 26.0 - 34.0 pg   MCHC 33.2 30.0 - 36.0 g/dL   RDW 56.8 61.6 - 83.7 %   Platelets 184 150 - 400 K/uL   nRBC 0.0 0.0 - 0.2 %   Neutrophils Relative % 60 %   Neutro Abs 2.8 1.7 - 7.7 K/uL   Lymphocytes Relative 25 %   Lymphs Abs 1.1 0.7 - 4.0 K/uL   Monocytes Relative 11 %   Monocytes Absolute 0.5 0.1 - 1.0 K/uL   Eosinophils Relative 2 %   Eosinophils Absolute 0.1 0.0 - 0.5 K/uL   Basophils Relative 2 %   Basophils Absolute 0.1 0.0 - 0.1 K/uL   Immature Granulocytes 0 %   Abs Immature Granulocytes 0.01 0.00 - 0.07 K/uL    Comment: Performed at [facility], [address] [place], [place] [address]    Blood Alcohol level:  Lab Results  Component Value Date   [facility] <10 07/20/2021    Metabolic Disorder Labs: Lab Results  Component Value Date   HGBA1C 5.5 04/10/2022   MPG 111.15 04/10/2022   No results found for: PROLACTIN Lab Results  Component Value Date   CHOL 167 11/20/2021   TRIG 107 11/20/2021    HDL  26 (L) 11/20/2021   CHOLHDL 6.4 11/20/2021   VLDL 21 11/20/2021   LDLCALC 120 (H) 11/20/2021    Physical Findings: AIMS:  , ,  ,  ,    CIWA:    COWS:     Musculoskeletal: Strength & Muscle Tone: within normal limits Gait & Station: normal Patient leans: N/A  Psychiatric Specialty Exam:  Presentation  General Appearance: Disheveled  Eye Contact:Fleeting  Speech:Garbled  Speech Volume:Decreased  Handedness:Right   Mood and Affect  Mood:Anxious  Affect:Constricted   Thought Process  Thought Processes:Irrevelant  Descriptions of Associations:Loose  Orientation:None  Thought Content:Illogical  History of Schizophrenia/Schizoaffective disorder:Yes  Duration of Psychotic Symptoms:Greater than six months  Hallucinations:No data recorded Ideas of Reference:Paranoia  Suicidal Thoughts:No data recorded Homicidal Thoughts:No data recorded  Sensorium  Memory:Immediate Poor; Remote Poor  Judgment:Impaired  Insight:Lacking   Executive Functions  Concentration:Poor  Attention Span:Poor  Recall:Poor  Fund of Knowledge:Poor  Language:Poor   Psychomotor Activity  Psychomotor Activity:No data recorded  Assets  Assets:Housing; Resilience; Social Support; Talents/Skills   Sleep  Sleep:No data recorded   Physical Exam: Physical Exam Vitals and nursing note reviewed.  Constitutional:      Appearance: Normal appearance.  HENT:     Head: Normocephalic and atraumatic.     Mouth/Throat:     Pharynx: Oropharynx is clear.  Eyes:     Pupils: Pupils are equal, round, and reactive to light.  Cardiovascular:     Rate and Rhythm: Normal rate and regular rhythm.  Pulmonary:     Effort: Pulmonary effort is normal.     Breath sounds: Normal breath sounds.  Abdominal:     General: Abdomen is flat.     Palpations: Abdomen is soft.  Musculoskeletal:        General: Normal range of motion.  Skin:    General: Skin is warm and dry.   Neurological:     General: No focal deficit present.     Mental Status: He is alert. Mental status is at baseline.  Psychiatric:        Speech: He is noncommunicative.   Review of Systems  Unable to perform ROS: Language  Blood pressure (!) 145/85, pulse 100, temperature 97.8 F (36.6 C), temperature source Oral, resp. rate 18, height 5\' 8"  (1.727 m), weight 85.8 kg, SpO2 100 %. Body mass index is 28.76 kg/m.   Treatment Plan Summary: Medication management and Plan continue current medication.  I think he has been doing quite well on the Clozapine.  No change to plan.  I would repeat once again my belief that he could be housed at a lower security facility because his behavior has improved so much.  Continue to work on placement.  , MD 04/24/2022, 12:26 PM

## 2022-04-24 NOTE — Progress Notes (Signed)
1200 am/Pt asleep in bed and 1:1 with sitter present. 0100 am/Pt asleep in bed and 1:1 with sitter with patient.

## 2022-04-24 NOTE — Progress Notes (Signed)
Patient on 1:1 for safety with a sitter present. Pt observed being active on the unit. Pt compliant with medication administration per MD orders. Pt remains safe on the unit.

## 2022-04-24 NOTE — Progress Notes (Signed)
1:1 Patient Hourly Rounding  0800: Patient is in the main dayroom, eating breakfast, with his assigned safety sitter present.  0900: Patient is in his room, just finished shaving with MHT, and assigned safety sitter present.  1000: Patient is in the hallway, with his assigned Recruitment consultant.  1100: Patient is in the dayroom, on the back hall, with his assigned safety sitter present.  1200: Patient is in the main dayroom, eating lunch, with his assigned safety sitter present.  1300: Patient is in the main dayroom, with his assigned safety sitter present.  1400: Patient is in the main dayroom, calm and composed, with his assigned safety sitter present.  1500: Patient is in his room, awake, with his assigned safety sitter present.  1600: Patient is walking in the hallway, with his assigned safety sitter present.  1700: Patient is in the dayroom, eating dinner, with his assigned safety sitter present.  1800: Patient is in the dayroom, on the back hall, with his assigned safety sitter present.  1900: Patient is in the dayroom, on the back hall, with his assigned safety sitter present.

## 2022-04-24 NOTE — Progress Notes (Signed)
0200 am/Pt asleep in bed and 1:1 with sitter. 0300 am/Pt asleep in bed and 1:1 with sitter. 0400 am/Pt asleep in bed and 1:1 with sitter. 0500 am/Pt asleep in bed and 1:1 with sitter. 0600 am/Pt asleep in bed and 1:1 with sitter. 0700 am/Pt asleep in bed and 1:1 with sitter.

## 2022-04-25 NOTE — TOC Progression Note (Signed)
Transition of Care [facility]) - Progression Note    Patient Details  Name: Adam [name] MRN: [id number] Date of Birth: Nov 02, 1961  Transition of Care [facility]) CM/SW Contact  [place] Cellar, RN Phone Number: 04/25/2022, 11:00 AM  Clinical Narrative:    Call from [place] @ [facility] requesting webex meeting to meet with patient and treatment team. Tentative meeting set for Friday @ 11am. Team will meet with patient for a few minutes and then meeting will resume outside of room with treatment team. Anticipate meeting time of 45 minutes total.         Expected Discharge Plan and Services                                                 Social Determinants of Health (SDOH) Interventions    Readmission Risk Interventions     View : No data to display.

## 2022-04-25 NOTE — Progress Notes (Signed)
Patient on 1:1 with a sitter present. Pt is asleep and without complaint. Pt slept all night. Pt remains safe on the unit.

## 2022-04-25 NOTE — Plan of Care (Signed)
D: Pt alert and oriented x 2. Pt denies experiencing any anxiety/depression at this time. Pt denies experiencing any pain at this time. Pt denies experiencing any SI/HI, or AVH at this time.   Pt is smiling and appears bright in affect.  A: Scheduled medications administered to pt, per MD orders. Support and encouragement provided. Frequent verbal contact made. Routine safety checks conducted q15 minutes.   R: No adverse drug reactions noted. Pt verbally contracts for safety at this time. Pt compliant with medications. Pt interacts well with others on the unit. Pt remains safe at this time. Will continue to monitor.   Problem: Education: Goal: Ability to state activities that reduce stress will improve Outcome: Not Progressing   Problem: Coping: Goal: Ability to identify and develop effective coping behavior will improve Outcome: Not Progressing

## 2022-04-25 NOTE — Progress Notes (Signed)
Patient on 1:1 for safety with a sitter present. Pt is without complaint. Pt compliant with medication administration per MD orders. Pt remains safe on the unit.

## 2022-04-25 NOTE — Progress Notes (Signed)
0730: Pt in room sitting on side of bed, with assigned safety sitter present.  0830: Pt in main dayroom eating breakfast, with assigned safety sitter present.  0930: Pt in main hallway walking past nurse's station with assigned safety sitter present.  1030: Pt in bedroom, with assigned safety sitter present.  1130: Pt walking past nurse's station, with assigned safety sitter present.  1230: Pt is in dayroom, with assigned safety sitter present.  1330: Pt is in dayroom, with assigned safety sitter present.  1430: Pt is in dayroom, with assigned safety sitter present.  1530: Pt is in dayroom, with MHT present.  1630: Pt is in dayroom eating, with assigned sitter present.  1730: Pt is in bedroom with assigned sitter present.  1830: Pt is in bedroom with assigned sitter present.

## 2022-04-25 NOTE — Progress Notes (Signed)
Recreation Therapy Notes  Date: 04/25/2022   Time: 10:45am   Location: Courtyard      Behavioral response: Appropriate   Intervention Topic: Leisure     Discussion/Intervention:  Group content today was focused on leisure. The group defined what leisure is and some positive leisure activities they participate in. Individuals identified the difference between good and bad leisure. Participants expressed how they feel after participating in the leisure of their choice. The group discussed how they go about picking a leisure activity and if others are involved in their leisure activities. The patient stated how many leisure activities they have to choose from and reasons why it is important to have leisure time. Individuals participated in the intervention "Exploration of Leisure" where they had a chance to identify new leisure activities as well as benefits of leisure. Clinical Observations/Feedback: Patient came to group and was able to explore participate in many leisure activities. Participant was able to identify leisure activities they enjoy outside of the hospital. Individual was social with peers and staff while participating in the intervention.    Jahmad Petrich LRT/CTRS         Jermesha Sottile 04/25/2022 1:14 PM

## 2022-04-25 NOTE — Progress Notes (Signed)
[facility] MD Progress Note  04/25/2022 3:09 PM Adam [name]  MRN:  [id number] Subjective: Follow-up patient with schizophrenia and brain injury.  No new problems today.  Loud at times but not at all aggressive not hostile.  Responding well to redirection.  Tolerating medicine. 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 No pertinent surgical history. Family History: History reviewed. No pertinent family history. Family Psychiatric  History: See previous Social History:  Social History   Substance and Sexual Activity  Alcohol Use Not Currently     Social History   Substance and Sexual Activity  Drug Use Not Currently    Social History   Socioeconomic History   Marital status: Single    Spouse name: Not on file   Number of children: Not on file   Years of education: Not on file   Highest education level: Not on file  Occupational History   Not on file  Tobacco Use   Smoking status: Never    Passive exposure: Never   Smokeless tobacco: Never  Vaping Use   Vaping Use: Unknown  Substance and Sexual Activity   Alcohol use: Not Currently   Drug use: Not Currently   Sexual activity: Not Currently  Other Topics Concern   Not on file  Social History Narrative   Not on file   Social Determinants of Health   Financial Resource Strain: Not on file  Food  Insecurity: Not on file  Transportation Needs: Not on file  Physical Activity: Not on file  Stress: Not on file  Social Connections: Not on file   Additional Social History:                         Sleep: Fair  Appetite:  Fair  Current Medications: Current Facility-Administered Medications  Medication Dose Route Frequency Provider Last Rate Last Admin   acetaminophen (TYLENOL) tablet 650 mg  650 mg Oral Q6H PRN Jaidyn Kuhl T, MD   650 mg at 04/12/22 0136   alum & mag hydroxide-simeth (MAALOX/MYLANTA) 200-200-20 MG/5ML suspension 30 mL  30 mL Oral Q4H PRN Arlinda Barcelona T, MD   30 mL at 04/12/22 0136   aspirin EC tablet 81 mg  81 mg Oral Daily Gean Laursen T, MD   81 mg at 04/25/22 0730   atorvastatin (LIPITOR) tablet 20 mg  20 mg Oral Daily Wendle Kina T, MD   20 mg at 04/25/22 0730   atropine 1 % ophthalmic solution 1 drop  1 drop Sublingual QID [name], MD   1 drop at 04/25/22 1204   benztropine (COGENTIN) tablet 0.5 mg  0.5 mg Oral BID Joeziah Voit T, MD   0.5 mg at 04/25/22 0730   clonazePAM (KLONOPIN) tablet 1 mg  1 mg Oral TID PRN He, [name], MD       cloZAPine (CLOZARIL) tablet 300 mg  300 mg Oral QHS  Marlynn Hinckley, [name], MD   300 mg at 04/24/22 2057   diphenhydrAMINE (BENADRYL) capsule 50 mg  50 mg Oral Q6H PRN Natalina Wieting T, MD   50 mg at 04/21/22 1538   Or   diphenhydrAMINE (BENADRYL) injection 50 mg  50 mg Intramuscular Q6H PRN Kyren Knick T, MD       gabapentin (NEURONTIN) capsule 300 mg  300 mg Oral TID Rivan Siordia T, MD   300 mg at 04/25/22 1203   haloperidol (HALDOL) tablet 5 mg  5 mg Oral TID Remington Highbaugh T, MD   5 mg at 04/25/22 1204   haloperidol (HALDOL) tablet 5 mg  5 mg Oral Q6H PRN Abagayle Klutts T, MD   5 mg at 04/21/22 1538   Or   haloperidol lactate (HALDOL) injection 5 mg  5 mg Intramuscular Q6H PRN Deondrick Searls T, MD       magnesium hydroxide (MILK OF MAGNESIA) suspension 30 mL  30 mL Oral Daily PRN Becci Batty T, MD   30 mL at 04/20/22  1411   metoprolol succinate (TOPROL-XL) 24 hr tablet 25 mg  25 mg Oral Daily Kayn Haymore T, MD   25 mg at 04/25/22 0730   paliperidone (INVEGA SUSTENNA) injection 156 mg  156 mg Intramuscular Q28 days Janasia Coverdale T, MD   156 mg at 03/29/22 1445   temazepam (RESTORIL) capsule 15 mg  15 mg Oral QHS Bryce Cheever T, MD   15 mg at 04/24/22 2057   ziprasidone (GEODON) injection 20 mg  20 mg Intramuscular Q12H PRN Becket Wecker, [name], MD   20 mg at 03/19/22 1551    Lab Results: No results found for this or any previous visit (from the past 48 hour(s)).  Blood Alcohol level:  Lab Results  Component Value Date   ETH <10 [id number]    Metabolic Disorder Labs: Lab Results  Component Value Date   HGBA1C 5.5 04/10/2022   MPG 111.15 04/10/2022   No results found for: PROLACTIN Lab Results  Component Value Date   CHOL 167 11/20/2021   TRIG 107 11/20/2021   HDL 26 (L) 11/20/2021   CHOLHDL 6.4 11/20/2021   VLDL 21 11/20/2021   LDLCALC 120 (H) 11/20/2021    Physical Findings: AIMS:  , ,  ,  ,    CIWA:    COWS:     Musculoskeletal: Strength & Muscle Tone: within normal limits Gait & Station: normal Patient leans: N/A  Psychiatric Specialty Exam:  Presentation  General Appearance: Disheveled  Eye Contact:Fleeting  Speech:Garbled  Speech Volume:Decreased  Handedness:Right   Mood and Affect  Mood:Anxious  Affect:Constricted   Thought Process  Thought Processes:Irrevelant  Descriptions of Associations:Loose  Orientation:None  Thought Content:Illogical  History of Schizophrenia/Schizoaffective disorder:Yes  Duration of Psychotic Symptoms:Greater than six months  Hallucinations:No data recorded Ideas of Reference:Paranoia  Suicidal Thoughts:No data recorded Homicidal Thoughts:No data recorded  Sensorium  Memory:Immediate Poor; Remote Poor  Judgment:Impaired  Insight:Lacking   Executive Functions  Concentration:Poor  Attention  Span:Poor  Recall:Poor  Fund of Knowledge:Poor  Language:Poor   Psychomotor Activity  Psychomotor Activity:No data recorded  Assets  Assets:Housing; Resilience; Social Support; Talents/Skills   Sleep  Sleep:No data recorded   Physical Exam: Physical Exam Vitals and nursing note reviewed.  Constitutional:      Appearance: Normal appearance.  HENT:     Head: Normocephalic and atraumatic.     Mouth/Throat:     Pharynx: Oropharynx is clear.  Eyes:     Pupils:  Pupils are equal, round, and reactive to light.  Cardiovascular:     Rate and Rhythm: Normal rate and regular rhythm.  Pulmonary:     Effort: Pulmonary effort is normal.     Breath sounds: Normal breath sounds.  Abdominal:     General: Abdomen is flat.     Palpations: Abdomen is soft.  Musculoskeletal:        General: Normal range of motion.  Skin:    General: Skin is warm and dry.  Neurological:     General: No focal deficit present.     Mental Status: He is alert. Mental status is at baseline.  Psychiatric:        Attention and Perception: He is inattentive.        Mood and Affect: Mood normal. Affect is inappropriate.        Speech: He is noncommunicative.   Review of Systems  Unable to perform ROS: Language  Blood pressure (!) 145/85, pulse 100, temperature 97.8 F (36.6 C), temperature source Oral, resp. rate 18, height 5\' 8"  (1.727 m), weight 85.8 kg, SpO2 100 %. Body mass index is 28.76 kg/m.   Treatment Plan Summary: Medication management and Plan I told the patient today that there is a chance that we may be able to arrange an interview for another place that could end up being a placement for him.  Made it clear to him that this was not a definite plan but at least offered a little hope.  I think he did understand he reacted very positively.  No change to medicine.  Alethia Berthold, MD 04/25/2022, 3:09 PM

## 2022-04-26 NOTE — Progress Notes (Signed)
[facility] MD Progress Note  04/26/2022 10:55 AM Adam [name]  MRN:  [id number] Subjective: Follow-up this gentleman with chronic impairment.  No change in behavior no change in presentation.  Vocalizes loudly at times but responds to redirection and does not show any recent aggression or behavior problems.  No evidence of any physical problems.  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 No pertinent surgical history. Family History: History reviewed. No pertinent family history. Family Psychiatric  History: See previous Social History:  Social History   Substance and Sexual Activity  Alcohol Use Not Currently     Social History   Substance and Sexual Activity  Drug Use Not Currently    Social History   Socioeconomic History   Marital status: Single    Spouse name: Not on file   Number of children: Not on file   Years of education: Not on file   Highest education level: Not on file  Occupational History   Not on file  Tobacco Use   Smoking status: Never    Passive exposure: Never   Smokeless tobacco: Never  Vaping Use   Vaping Use: Unknown  Substance and Sexual Activity   Alcohol use: Not Currently   Drug use: Not Currently   Sexual activity: Not Currently  Other Topics Concern   Not on file  Social History Narrative   Not on  file   Social Determinants of Health   Financial Resource Strain: Not on file  Food Insecurity: Not on file  Transportation Needs: Not on file  Physical Activity: Not on file  Stress: Not on file  Social Connections: Not on file   Additional Social History:                         Sleep: Fair  Appetite:  Fair  Current Medications: Current Facility-Administered Medications  Medication Dose Route Frequency Provider Last Rate Last Admin   acetaminophen (TYLENOL) tablet 650 mg  650 mg Oral Q6H PRN Jasyn Mey T, MD   650 mg at 04/26/22 0637   alum & mag hydroxide-simeth (MAALOX/MYLANTA) 200-200-20 MG/5ML suspension 30 mL  30 mL Oral Q4H PRN Kaedance Magos T, MD   30 mL at 04/26/22 [id number]   aspirin EC tablet 81 mg  81 mg Oral Daily Cecila Satcher, [name], MD   81 mg at 04/26/22 [id number]   atorvastatin (LIPITOR) tablet 20 mg  20 mg Oral Daily Onda Kattner T, MD   20 mg at 04/26/22 0719   atropine 1 % ophthalmic solution 1 drop  1 drop Sublingual QID [name], MD   1 drop at 04/26/22 0830   benztropine (COGENTIN) tablet 0.5 mg  0.5 mg Oral BID Telford Archambeau T, MD   0.5 mg at 04/26/22 0718   clonazePAM (KLONOPIN) tablet 1 mg  1 mg Oral TID PRN He, [name], MD  cloZAPine (CLOZARIL) tablet 300 mg  300 mg Oral QHS Rise Traeger T, MD   300 mg at 04/25/22 2109   diphenhydrAMINE (BENADRYL) capsule 50 mg  50 mg Oral Q6H PRN Edith Groleau T, MD   50 mg at 04/21/22 1538   Or   diphenhydrAMINE (BENADRYL) injection 50 mg  50 mg Intramuscular Q6H PRN Tereasa Yilmaz T, MD       gabapentin (NEURONTIN) capsule 300 mg  300 mg Oral TID Rae Sutcliffe T, MD   300 mg at 04/26/22 0719   haloperidol (HALDOL) tablet 5 mg  5 mg Oral TID Christne Platts T, MD   5 mg at 04/26/22 [id number]   haloperidol (HALDOL) tablet 5 mg  5 mg Oral Q6H PRN Juri Dinning T, MD   5 mg at 04/21/22 1538   Or   haloperidol lactate (HALDOL) injection 5 mg  5 mg Intramuscular Q6H PRN Finbar Nippert T, MD       magnesium hydroxide (MILK OF  MAGNESIA) suspension 30 mL  30 mL Oral Daily PRN Orphia Mctigue T, MD   30 mL at 04/20/22 1411   metoprolol succinate (TOPROL-XL) 24 hr tablet 25 mg  25 mg Oral Daily Shamon Cothran T, MD   25 mg at 04/26/22 0719   paliperidone (INVEGA SUSTENNA) injection 156 mg  156 mg Intramuscular Q28 days Temeca Somma T, MD   156 mg at 04/26/22 1000   temazepam (RESTORIL) capsule 15 mg  15 mg Oral QHS Liliana Dang T, MD   15 mg at 04/25/22 2109   ziprasidone (GEODON) injection 20 mg  20 mg Intramuscular Q12H PRN Serah Nicoletti, [name], MD   20 mg at 03/19/22 1551    Lab Results: No results found for this or any previous visit (from the past 48 hour(s)).  Blood Alcohol level:  Lab Results  Component Value Date   ETH <10 [id number]    Metabolic Disorder Labs: Lab Results  Component Value Date   HGBA1C 5.5 04/10/2022   MPG 111.15 04/10/2022   No results found for: PROLACTIN Lab Results  Component Value Date   CHOL 167 11/20/2021   TRIG 107 11/20/2021   HDL 26 (L) 11/20/2021   CHOLHDL 6.4 11/20/2021   VLDL 21 11/20/2021   LDLCALC 120 (H) 11/20/2021    Physical Findings: AIMS:  , ,  ,  ,    CIWA:    COWS:     Musculoskeletal: Strength & Muscle Tone: within normal limits Gait & Station: normal Patient leans: N/A  Psychiatric Specialty Exam:  Presentation  General Appearance: Disheveled  Eye Contact:Fleeting  Speech:Garbled  Speech Volume:Decreased  Handedness:Right   Mood and Affect  Mood:Anxious  Affect:Constricted   Thought Process  Thought Processes:Irrevelant  Descriptions of Associations:Loose  Orientation:None  Thought Content:Illogical  History of Schizophrenia/Schizoaffective disorder:Yes  Duration of Psychotic Symptoms:Greater than six months  Hallucinations:No data recorded Ideas of Reference:Paranoia  Suicidal Thoughts:No data recorded Homicidal Thoughts:No data recorded  Sensorium  Memory:Immediate Poor; Remote  Poor  Judgment:Impaired  Insight:Lacking   Executive Functions  Concentration:Poor  Attention Span:Poor  Recall:Poor  Fund of Knowledge:Poor  Language:Poor   Psychomotor Activity  Psychomotor Activity:No data recorded  Assets  Assets:Housing; Resilience; Social Support; Talents/Skills   Sleep  Sleep:No data recorded   Physical Exam: Physical Exam Vitals and nursing note reviewed.  Constitutional:      Appearance: Normal appearance.  HENT:     Head: Normocephalic and atraumatic.     Mouth/Throat:     Pharynx:  Oropharynx is clear.  Eyes:     Pupils: Pupils are equal, round, and reactive to light.  Cardiovascular:     Rate and Rhythm: Normal rate and regular rhythm.  Pulmonary:     Effort: Pulmonary effort is normal.     Breath sounds: Normal breath sounds.  Abdominal:     General: Abdomen is flat.     Palpations: Abdomen is soft.  Musculoskeletal:        General: Normal range of motion.  Skin:    General: Skin is warm and dry.  Neurological:     General: No focal deficit present.     Mental Status: He is alert. Mental status is at baseline.  Psychiatric:        Attention and Perception: He is inattentive.        Mood and Affect: Mood normal. Affect is inappropriate.        Speech: He is noncommunicative.        Thought Content: Thought content normal.   Review of Systems  Unable to perform ROS: Language  Blood pressure 114/86, pulse (!) 103, temperature 97.6 F (36.4 C), temperature source Oral, resp. rate 17, height 5\' 8"  (1.727 m), weight 85.8 kg, SpO2 98 %. Body mass index is 28.76 kg/m.   Treatment Plan Summary: Medication management and Plan continue medication.  There should be an interview happening within the next couple days for [facility].  Patient was informed of this and seemed pleased.  I am cautiously optimistic at how that will go.  Recently behavior has been fine.  Alethia Berthold, MD 04/26/2022, 10:55 AM

## 2022-04-26 NOTE — Plan of Care (Signed)
°  Problem: Group Participation °Goal: STG - Patient will engage in groups with a calm and appropriate mood at least 2x within 5 recreation therapy group sessions °Description: STG - Patient will engage in groups with a calm and appropriate mood at least 2x within 5 recreation therapy group sessions °Outcome: Progressing °  °

## 2022-04-26 NOTE — Progress Notes (Addendum)
Recreation Therapy Notes  Date: 04/26/2022   Time: 10:30 am   Location: Courtyard      Behavioral response: Appropriate   Intervention Topic: Wellness    Discussion/Intervention:  Group content today was focused on Wellness. The group defined wellness and some positive ways they make decisions for themselves. Individuals expressed reasons why they neglected any wellness in the past. Patients described ways to improve wellness skills in the future. The group explained what could happen if they did not do any wellness at all. Participants express how bad choices has affected them and others around them. Individual explained the importance of wellness. The group participated in the intervention "Testing my Wellness" where they had a chance to identify some of their weaknesses and strengths in wellness.  Clinical Observations/Feedback: Patient came to group and was able to explore and identify wellness activities that they can use outside of the hospital. Individual was social with peers and staff while participating in the intervention.    Boston Cookson LRT/CTRS         Mija Effertz 04/26/2022 12:17 PM

## 2022-04-26 NOTE — Progress Notes (Signed)
1:1 hourly rounding  0730: Pt ambulating in main area with assigned sitter present.  0830: Pt in dayroom, calm and composed, with assigned sitter present.  0930: Pt in bedroom sitting on side of bed, with assigned sitter present.   1030: Pt in hallway, calm and composed, with assigned sitter present.  1130: Pt in cafeteria eating with assigned sitter present.  1230: Pt in bedroom, with assigned sitter present.  1330: Pt in craft room, with assigned sitter present.  1430: Pt in dayroom, with assigned sitter present.  1530: Pt in bedroom, with RN present.  1630: Pt in route to medication room with assigned sitter present.  1730: Pt in bedroom, with RN present.  1830: Pt in bedroom, with assigned sitter present.  1930: Pt in hallway, with assigned sitter present.

## 2022-04-26 NOTE — Progress Notes (Signed)
Patient on 1:1 for safety with a sitter present. Pt is asleep and without complaint. Pt remains safe on the unit.

## 2022-04-26 NOTE — Plan of Care (Signed)
D: Pt alert and oriented. Pt denies experiencing any anxiety/depression at this time. Pt denies experiencing any pain at this time. Pt denies experiencing any SI/HI, or AVH at this time.   A: Scheduled medications administered to pt, per MD orders. Support and encouragement provided. Frequent verbal contact made. Routine safety checks conducted q15 minutes.   R: No adverse drug reactions noted. Pt verbally contracts for safety at this time. Pt compliant with medications and treatment plan. Pt interacts well with others on the unit. Pt remains safe at this time. Will continue to monitor.   Problem: Self-Concept: Goal: Level of anxiety will decrease Outcome: Progressing   Problem: Self-Concept: Goal: Ability to identify factors that promote anxiety will improve Outcome: Not Progressing

## 2022-04-26 NOTE — Progress Notes (Signed)
Patient on 1:1 for safety with a sitter present. Pt calm and cooperative. Pt compliant with medication administration per MD orders. Pt continues to be safe on the unit

## 2022-04-27 NOTE — Progress Notes (Signed)
[facility] MD Progress Note  04/27/2022 2:15 PM Adam [name]  MRN:  [id number] Subjective: Follow-up patient with schizophrenia and stroke.  No new complaints.  He actually does seem more attentive and like he can use language slightly better.  Sometimes a couple of the words actually makes sense.  He is doing well interacting with others on the unit.  No behavior problems.  Occasionally still gets loud but not to a degree that is not redirectable. 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 No pertinent surgical history. Family History: History reviewed. No pertinent family history. Family Psychiatric  History: See previous Social History:  Social History   Substance and Sexual Activity  Alcohol Use Not Currently     Social History   Substance and Sexual Activity  Drug Use Not Currently    Social History   Socioeconomic History   Marital status: Single    Spouse name: Not on file   Number of children: Not on file   Years of education: Not on file   Highest education level: Not on file  Occupational History   Not on file  Tobacco Use   Smoking status: Never    Passive exposure: Never   Smokeless tobacco: Never  Vaping Use   Vaping Use: Unknown  Substance and Sexual Activity   Alcohol use: Not Currently   Drug use: Not Currently   Sexual activity: Not Currently  Other  Topics Concern   Not on file  Social History Narrative   Not on file   Social Determinants of Health   Financial Resource Strain: Not on file  Food Insecurity: Not on file  Transportation Needs: Not on file  Physical Activity: Not on file  Stress: Not on file  Social Connections: Not on file   Additional Social History:                         Sleep: Fair  Appetite:  Fair  Current Medications: Current Facility-Administered Medications  Medication Dose Route Frequency Provider Last Rate Last Admin   acetaminophen (TYLENOL) tablet 650 mg  650 mg Oral Q6H PRN Veena Sturgess T, MD   650 mg at 04/26/22 0637   alum & mag hydroxide-simeth (MAALOX/MYLANTA) 200-200-20 MG/5ML suspension 30 mL  30 mL Oral Q4H PRN Avrielle Fry T, MD   30 mL at 04/26/22 3291   aspirin EC tablet 81 mg  81 mg Oral Daily Makailyn Mccormick T, MD   81 mg at 04/27/22 0810   atorvastatin (LIPITOR) tablet 20 mg  20 mg Oral Daily Tavita Eastham T, MD   20 mg at 04/27/22 0810   atropine 1 % ophthalmic solution 1 drop  1 drop Sublingual QID [name], MD   1 drop at 04/27/22 1144   benztropine (COGENTIN) tablet 0.5 mg  0.5 mg Oral BID Lolita Faulds, [name], MD   0.5 mg at 04/27/22 [phone]  clonazePAM (KLONOPIN) tablet 1 mg  1 mg Oral TID PRN He, [name], MD       cloZAPine (CLOZARIL) tablet 300 mg  300 mg Oral QHS Nicola Quesnell T, MD   300 mg at 04/26/22 2104   diphenhydrAMINE (BENADRYL) capsule 50 mg  50 mg Oral Q6H PRN Kalina Morabito T, MD   50 mg at 04/21/22 1538   Or   diphenhydrAMINE (BENADRYL) injection 50 mg  50 mg Intramuscular Q6H PRN Georges Victorio T, MD       gabapentin (NEURONTIN) capsule 300 mg  300 mg Oral TID Amaury Kuzel T, MD   300 mg at 04/27/22 1143   haloperidol (HALDOL) tablet 5 mg  5 mg Oral TID Anushka Hartinger T, MD   5 mg at 04/27/22 1143   haloperidol (HALDOL) tablet 5 mg  5 mg Oral Q6H PRN Jarmarcus Wambold T, MD   5 mg at 04/21/22 1538   Or   haloperidol lactate (HALDOL) injection 5 mg  5 mg  Intramuscular Q6H PRN Angelle Isais T, MD       magnesium hydroxide (MILK OF MAGNESIA) suspension 30 mL  30 mL Oral Daily PRN Stefan Markarian T, MD   30 mL at 04/20/22 1411   metoprolol succinate (TOPROL-XL) 24 hr tablet 25 mg  25 mg Oral Daily Lathyn Griggs T, MD   25 mg at 04/27/22 0810   paliperidone (INVEGA SUSTENNA) injection 156 mg  156 mg Intramuscular Q28 days Usbaldo Pannone T, MD   156 mg at 04/26/22 1000   temazepam (RESTORIL) capsule 15 mg  15 mg Oral QHS Hazael Olveda T, MD   15 mg at 04/26/22 2104   ziprasidone (GEODON) injection 20 mg  20 mg Intramuscular Q12H PRN Karinna Beadles, [name], MD   20 mg at 03/19/22 1551    Lab Results: No results found for this or any previous visit (from the past 48 hour(s)).  Blood Alcohol level:  Lab Results  Component Value Date   ETH <10 07/20/2021    Metabolic Disorder Labs: Lab Results  Component Value Date   HGBA1C 5.5 04/10/2022   MPG 111.15 04/10/2022   No results found for: PROLACTIN Lab Results  Component Value Date   CHOL 167 11/20/2021   TRIG 107 11/20/2021   HDL 26 (L) 11/20/2021   CHOLHDL 6.4 11/20/2021   VLDL 21 11/20/2021   LDLCALC 120 (H) 11/20/2021    Physical Findings: AIMS:  , ,  ,  ,    CIWA:    COWS:     Musculoskeletal: Strength & Muscle Tone: within normal limits Gait & Station: normal Patient leans: N/A  Psychiatric Specialty Exam:  Presentation  General Appearance: Disheveled  Eye Contact:Fleeting  Speech:Garbled  Speech Volume:Decreased  Handedness:Right   Mood and Affect  Mood:Anxious  Affect:Constricted   Thought Process  Thought Processes:Irrevelant  Descriptions of Associations:Loose  Orientation:None  Thought Content:Illogical  History of Schizophrenia/Schizoaffective disorder:Yes  Duration of Psychotic Symptoms:Greater than six months  Hallucinations:No data recorded Ideas of Reference:Paranoia  Suicidal Thoughts:No data recorded Homicidal Thoughts:No data  recorded  Sensorium  Memory:Immediate Poor; Remote Poor  Judgment:Impaired  Insight:Lacking   Executive Functions  Concentration:Poor  Attention Span:Poor  Recall:Poor  Fund of Knowledge:Poor  Language:Poor   Psychomotor Activity  Psychomotor Activity:No data recorded  Assets  Assets:Housing; Resilience; Social Support; Talents/Skills   Sleep  Sleep:No data recorded   Physical Exam: Physical Exam Vitals and nursing note reviewed.  Constitutional:      Appearance: Normal appearance.  HENT:     Head: Normocephalic and atraumatic.     Mouth/Throat:     Pharynx: Oropharynx is clear.  Eyes:     Pupils: Pupils are equal, round, and reactive to light.  Cardiovascular:     Rate and Rhythm: Normal rate and regular rhythm.  Pulmonary:     Effort: Pulmonary effort is normal.     Breath sounds: Normal breath sounds.  Abdominal:     General: Abdomen is flat.     Palpations: Abdomen is soft.  Musculoskeletal:        General: Normal range of motion.  Skin:    General: Skin is warm and dry.  Neurological:     General: No focal deficit present.     Mental Status: He is alert. Mental status is at baseline.  Psychiatric:        Attention and Perception: Attention normal. He is attentive.        Mood and Affect: Mood normal. Affect is blunt.        Speech: He is noncommunicative.   Review of Systems  Unable to perform ROS: Language  Blood pressure 125/90, pulse 95, temperature 97.6 F (36.4 C), resp. rate 17, height 5\' 8"  (1.727 m), weight 85.8 kg, SpO2 100 %. Body mass index is 28.76 kg/m.   Treatment Plan Summary: Medication management and Plan continue all current medicines.  We are hoping that we may be able to look for placement before too long but if not he has just another week year before his 6-week term is up.  , MD 04/27/2022, 2:15 PM

## 2022-04-27 NOTE — Progress Notes (Signed)
1:1 Patient Hourly Rounding  0800: Patient is in the main dayroom, eating breakfast, with his assigned safety sitter present.  0900: Patient is in his room, awake, with his assigned safety sitter present.  1000: Patient is in his room, awake, with his assigned safety sitter present.  1100: Patient is in the dayroom, on the back hall, with this Probation officer and Manuela Schwartz, Clinical Case Manager, attending an interview for possible placement. Patient's assigned safety sitter present.  1200: Patient is in his room, awake, with his assigned safety sitter present.  1300: Patient is walking in the hallway, with his assigned safety sitter present.  1400: Patient is outside, in the courtyard, with staff and other members on the unit. Patient's assigned safety sitter present.  1500: Patient is in his room, awake, with his assigned safety sitter present.  1600: Patient is in his bedroom, awake, with his assigned safety sitter present.  1700: Patient is in the dayroom, eating dinner, with his assigned safety sitter present.  1800: Patient is outside, in the courtyard, with staff and his assigned safety sitter present.  1900: Patient is in the dayroom, on the back hall, with his assigned safety sitter present.

## 2022-04-27 NOTE — Plan of Care (Signed)
D- Patient alert and oriented to person and situation. Patient presented in a pleasant mood on assessment stating that he slept ok last night and had no complaints to voice to this Clinical research associate. 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 Patient does say a few words and repeats words that he hears others say. This writer is unable to fully assess if patient is experiencing any SI, HI, AVH, but when this writer asked patient about any of this, he said "no". Patient does not appear to be in any pain nor any acute distress. Patient had no stated goals for today.  A- Scheduled medications administered to patient, per MD orders. Support and encouragement provided.  Routine safety checks conducted every 15 minutes.  Patient informed to notify staff with problems or concerns.  R- No adverse drug reactions noted. Patient contracts for safety at this time. Patient compliant with medications and treatment plan. Patient receptive, calm, and cooperative. Patient interacts well with others on the unit.  Patient remains safe at this time.  Problem: Safety: Goal: Violent Restraint(s) Outcome: Progressing   Problem: Education: Goal: Ability to state activities that reduce stress will improve Outcome: Progressing   Problem: Coping: Goal: Ability to identify and develop effective coping behavior will improve Outcome: Progressing   Problem: Self-Concept: Goal: Ability to identify factors that promote anxiety will improve Outcome: Progressing Goal: Level of anxiety will decrease Outcome: Progressing Goal: Ability to modify response to factors that promote anxiety will improve Outcome: Progressing

## 2022-04-27 NOTE — Progress Notes (Signed)
Patient on 1:1 for safety with a sitter present. Pt is asleep and without complaint. Pt remains safe on the unit.  

## 2022-04-27 NOTE — TOC Progression Note (Addendum)
Transition of Care [facility]) - Progression Note    Patient Details  Name: Adam [name] MRN: [id number] Date of Birth: [birth year]  Transition of Care [facility]) CM/SW Contact  [place] Cellar, RN Phone Number: 04/27/2022, 12:01 PM  Clinical Narrative:    Completed Webex with [facility] for possible placement. RN and RN CM present for meeting. Patient included in meeting and displayed appropriate behaviors. Facility reports could be several weeks before decisions will be made and they are unable to give any type of timeline.   Facility requesting patient receive COVID vaccine and requesting MAR list be faxed [phone] showing day to day medications and any refusals for the month of May. Will send MAR and COVID vaccine records together once completed.        Expected Discharge Plan and Services                                                 Social Determinants of Health (SDOH) Interventions    Readmission Risk Interventions     View : No data to display.

## 2022-04-28 NOTE — Progress Notes (Signed)
D: Patient alert and oriented. Patient denies pain. Patient denies anxiety and depression. Patient denies SI/HI/AVH.  Patient was irritable during lunch, yelling demanding some underwear.  After mesh underwear was provided to patient, affect changed. Patient has been pleasant the rest of the shift.   A: Scheduled medications administered to patient, per MD orders. Support and encouragement provided to patient.  Q15 minute safety checks maintained.   R: Patient compliant with medication administration and treatment plan. No adverse drug reactions noted. Patient remains safe on the unit at this time.

## 2022-04-28 NOTE — Group Note (Signed)
LCSW Group Therapy Note  Group Date: 04/28/2022 Start Time: 1330 End Time: 1430   Type of Therapy and Topic:  Group Therapy - Healthy vs Unhealthy Coping Skills  Participation Level:  Minimal   Description of Group The focus of this group was to determine what unhealthy coping techniques typically are used by group members and what healthy coping techniques would be helpful in coping with various problems. Patients were guided in becoming aware of the differences between healthy and unhealthy coping techniques. Patients were asked to identify 2-3 healthy coping skills they would like to learn to use more effectively.  Therapeutic Goals Patients learned that coping is what human beings do all day long to deal with various situations in their lives Patients defined and discussed healthy vs unhealthy coping techniques Patients identified their preferred coping techniques and identified whether these were healthy or unhealthy Patients determined 2-3 healthy coping skills they would like to become more familiar with and use more often. Patients provided support and ideas to each other   Summary of Patient Progress:  Patient was present for the duration of group. Patient frequently said "Cleopatra" out loud at various times throughout group; however, patient remained calm throughout the session.   Therapeutic Modalities Cognitive Behavioral Therapy Motivational Interviewing  [name], Theresia Majors 04/28/2022  4:31 PM

## 2022-04-28 NOTE — Progress Notes (Signed)
[facility] MD Progress Note  04/28/2022 12:17 PM Adam [name]  MRN:  [id number] Subjective: Patient is seen on rounds.  He denies any side effects from his medications.  He is able to contract for safety in the hospital.  Nurses state that he is doing good.  He has no complaints and no issues.  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 No pertinent surgical history. Family History: History reviewed. No pertinent family history.  Social History:  Social History   Substance and Sexual Activity  Alcohol Use Not Currently     Social History   Substance and Sexual Activity  Drug Use Not Currently    Social History   Socioeconomic History   Marital status: Single    Spouse name: Not on file   Number of children: Not on file   Years of education: Not on file   Highest education level: Not on file  Occupational History   Not on file  Tobacco Use   Smoking status: Never    Passive exposure: Never   Smokeless tobacco: Never  Vaping Use   Vaping Use: Unknown  Substance and Sexual Activity   Alcohol use: Not Currently   Drug use: Not Currently   Sexual activity: Not Currently  Other Topics Concern   Not on file  Social History Narrative   Not on file   Social Determinants of Health   Financial Resource Strain: Not on file  Food Insecurity: Not on file  Transportation  Needs: Not on file  Physical Activity: Not on file  Stress: Not on file  Social Connections: Not on file   Additional Social History:                         Sleep: Good  Appetite:  Good  Current Medications: Current Facility-Administered Medications  Medication Dose Route Frequency Provider Last Rate Last Admin   acetaminophen (TYLENOL) tablet 650 mg  650 mg Oral Q6H PRN [name], [name], MD   650 mg at 04/26/22 0637   alum & mag hydroxide-simeth (MAALOX/MYLANTA) 200-200-20 MG/5ML suspension 30 mL  30 mL Oral Q4H PRN [name], [name], MD   30 mL at 04/26/22 8127   aspirin EC tablet 81 mg  81 mg Oral Daily [name], [name], MD   81 mg at 04/28/22 0846   atorvastatin (LIPITOR) tablet 20 mg  20 mg Oral Daily [name], [name], MD   20 mg at 04/28/22 0847   atropine 1 % ophthalmic solution 1 drop  1 drop Sublingual QID [name], MD   1 drop at 04/28/22 1205   benztropine (COGENTIN) tablet 0.5 mg  0.5 mg Oral BID [name], [name], MD   0.5 mg at 04/28/22 0847   clonazePAM (KLONOPIN) tablet 1 mg  1 mg Oral TID PRN He, [name], MD       cloZAPine (CLOZARIL) tablet 300 mg  300 mg Oral QHS [name], [name], MD   300 mg at 04/27/22 2143   diphenhydrAMINE (BENADRYL) capsule 50 mg  50 mg Oral Q6H PRN [name], [name], MD   50 mg at 04/21/22 1538   Or   diphenhydrAMINE (BENADRYL) injection 50 mg  50 mg Intramuscular Q6H PRN [name], [name], MD       gabapentin (NEURONTIN) capsule 300 mg  300 mg Oral TID [name], [name], MD   300 mg at 04/28/22 1204   haloperidol (HALDOL) tablet 5 mg  5 mg Oral TID [name], [name], MD   5 mg at 04/28/22 1204   haloperidol (HALDOL) tablet 5 mg  5 mg Oral Q6H PRN [name], [name], MD   5 mg at 04/21/22 1538   Or   haloperidol lactate (HALDOL) injection 5 mg  5 mg Intramuscular Q6H PRN [name], [name], MD       magnesium hydroxide (MILK OF MAGNESIA) suspension 30 mL  30 mL Oral Daily PRN [name], [name], MD   30 mL at 04/20/22 1411   metoprolol succinate (TOPROL-XL)  24 hr tablet 25 mg  25 mg Oral Daily [name], [name], MD   25 mg at 04/28/22 0847   paliperidone (INVEGA SUSTENNA) injection 156 mg  156 mg Intramuscular Q28 days [name], [name], MD   156 mg at 04/26/22 1000   temazepam (RESTORIL) capsule 15 mg  15 mg Oral QHS [name], [name], MD   15 mg at 04/27/22 2143   ziprasidone (GEODON) injection 20 mg  20 mg Intramuscular Q12H PRN [name], [name], MD   20 mg at 03/19/22 1551    Lab Results: No results found for this or any previous visit (from the past 48 hour(s)).  Blood Alcohol level:  Lab Results  Component Value Date   ETH <10 07/20/2021    Metabolic Disorder Labs: Lab Results  Component Value Date   HGBA1C 5.5 04/10/2022   MPG 111.15 04/10/2022   No results found for: PROLACTIN Lab Results  Component Value Date   CHOL 167 11/20/2021   TRIG 107 11/20/2021   HDL 26 (L) 11/20/2021   CHOLHDL 6.4 11/20/2021   VLDL 21 11/20/2021   LDLCALC 120 (H) 11/20/2021    Physical Findings: AIMS:  , ,  ,  ,    CIWA:    COWS:     Musculoskeletal: Strength & Muscle Tone: within normal limits Gait & Station: normal Patient leans: N/A  Psychiatric Specialty Exam:  Presentation  General Appearance: Disheveled  Eye Contact:Fleeting  Speech:Garbled  Speech Volume:Decreased  Handedness:Right   Mood and Affect  Mood:Anxious  Affect:Constricted   Thought Process  Thought Processes:Irrevelant  Descriptions of Associations:Loose  Orientation:None  Thought Content:Illogical  History of Schizophrenia/Schizoaffective disorder:Yes  Duration of Psychotic Symptoms:Greater than six months  Hallucinations:No data recorded Ideas of Reference:Paranoia  Suicidal Thoughts:No data recorded Homicidal Thoughts:No data recorded  Sensorium  Memory:Immediate Poor; Remote Poor  Judgment:Impaired  Insight:Lacking   Executive Functions  Concentration:Poor  Attention Span:Poor  Recall:Poor  Fund of  Knowledge:Poor  Language:Poor   Psychomotor Activity  Psychomotor Activity:No data recorded  Assets  Assets:Housing; Resilience; Social Support; Talents/Skills   Sleep  Sleep:No data recorded   Physical Exam: Physical Exam Vitals and nursing note reviewed.  Constitutional:      Appearance: Normal appearance. He is normal weight.  Neurological:     General: No focal deficit present.     Mental Status: He is alert and oriented  to person, place, and time.  Psychiatric:        Mood and Affect: Mood normal.        Behavior: Behavior normal.   Review of Systems  Constitutional: Negative.   HENT: Negative.    Eyes: Negative.   Respiratory: Negative.    Cardiovascular: Negative.   Gastrointestinal: Negative.   Genitourinary: Negative.   Musculoskeletal: Negative.   Skin: Negative.   Neurological: Negative.   Endo/Heme/Allergies: Negative.   Psychiatric/Behavioral: Negative.    Blood pressure 125/90, pulse 95, temperature 97.6 F (36.4 C), resp. rate 17, height 5\' 8"  (1.727 m), weight 85.8 kg, SpO2 100 %. Body mass index is 28.76 kg/m.   Treatment Plan Summary: Daily contact with patient to assess and evaluate symptoms and progress in treatment, Medication management, and Plan continue current medications.  Sarina Ill, DO 04/28/2022, 12:17 PM

## 2022-04-28 NOTE — Plan of Care (Signed)
  Problem: Safety: Goal: Violent Restraint(s) Outcome: Progressing   Problem: Self-Concept: Goal: Ability to identify factors that promote anxiety will improve Outcome: Progressing Goal: Level of anxiety will decrease Outcome: Progressing

## 2022-04-28 NOTE — Progress Notes (Signed)
Patient alert and oriented x 4 no distress noted he continues to be on 1:1, he is receptive to staff and interacting appropriately with staff and peers . Patient denies SI/HI/AVH and complaint with 15 minutes safety checks. 15 minutes safety checks maintained will continue to monitor.

## 2022-04-28 NOTE — Progress Notes (Signed)
1:1 Patient hourly rounding  0800: Patient is in bed, asleep. Assigned sitter present with patient.  0900: Patient walking around the hallway.Patient compliant with medication administration. Assigned sitter present with patient.  1000: Patient is in bedroom. Assigned sitter present with patient.  1100: Patient is in the hallway interacting with staff. Assigned sitter present with patient.  1200: Patient has received lunch. Patient compliant with medication administration. Assigned sitter is present with patient. Patient is in the hallway. Affect is irritable.   1300: Patient is in the dayroom. Assigned sitter is present with patient.   1400: Patient is in group. Assigned sitter is present with patient.  1500: Patient is in the dayroom. Assigned sitter is present with patient.   1600: Patient received dinner. Patient compliant with medication administration. Assigned sitter is present with patient.  1700: Patient is in the dayroom. Assigned sitter is present with patient.  1800: Patient is in the dayroom. Assigned sitter is present with patient.   1900: Patient is in bedroom. Assigned sitter is present with patient.

## 2022-04-29 NOTE — Progress Notes (Signed)
Patient alert and oriented x 4 no distress noted he continues to be on 1:1, he is receptive to staff, he appears anxious and restless, pacing back and forth on the unit and sometimes yelling. Patient denies SI/HI/AVH and complaint with 15 minutes safety checks. 15 minutes safety checks maintained will continue to monitor

## 2022-04-29 NOTE — Progress Notes (Signed)
PT is resting in bed. Refused to get up and eat at this time. Refused medications at this time. Staff will continue to monitor for safety.

## 2022-04-29 NOTE — Progress Notes (Signed)
[facility] MD Progress Note  04/29/2022 12:07 PM Adam [name]  MRN:  [id number] Subjective:   Patient is seen on rounds.  He denies any side effects from his medications.  He is able to contract for safety in the hospital.  Nurses state that he is doing good.  He has no complaints and no issues.  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 No pertinent surgical history. Family History: History reviewed. No pertinent family history.  Social History:  Social History   Substance and Sexual Activity  Alcohol Use Not Currently     Social History   Substance and Sexual Activity  Drug Use Not Currently    Social History   Socioeconomic History   Marital status: Single    Spouse name: Not on file   Number of children: Not on file   Years of education: Not on file   Highest education level: Not on file  Occupational History   Not on file  Tobacco Use   Smoking status: Never    Passive exposure: Never   Smokeless tobacco: Never  Vaping Use   Vaping Use: Unknown  Substance and Sexual Activity   Alcohol use: Not Currently   Drug use: Not Currently   Sexual activity: Not Currently  Other Topics Concern   Not on file  Social History Narrative   Not on file   Social Determinants of Health   Financial Resource Strain: Not on file  Food Insecurity: Not on file  Transportation  Needs: Not on file  Physical Activity: Not on file  Stress: Not on file  Social Connections: Not on file   Additional Social History:                         Sleep: Good  Appetite:  Good  Current Medications: Current Facility-Administered Medications  Medication Dose Route Frequency Provider Last Rate Last Admin   acetaminophen (TYLENOL) tablet 650 mg  650 mg Oral Q6H PRN [name], [name], MD   650 mg at 04/26/22 0637   alum & mag hydroxide-simeth (MAALOX/MYLANTA) 200-200-20 MG/5ML suspension 30 mL  30 mL Oral Q4H PRN [name], [name], MD   30 mL at 04/26/22 8325   aspirin EC tablet 81 mg  81 mg Oral Daily [name], [name], MD   81 mg at 04/29/22 0942   atorvastatin (LIPITOR) tablet 20 mg  20 mg Oral Daily [name], [name], MD   20 mg at 04/29/22 0942   atropine 1 % ophthalmic solution 1 drop  1 drop Sublingual QID [name], MD   1 drop at 04/29/22 1157   benztropine (COGENTIN) tablet 0.5 mg  0.5 mg Oral BID [name], [name], MD   0.5 mg at 04/29/22 0942   clonazePAM (KLONOPIN) tablet 1 mg  1 mg Oral TID PRN He, [name], MD   1 mg at 04/29/22 0313   cloZAPine (CLOZARIL)  tablet 300 mg  300 mg Oral QHS [name], [name], MD   300 mg at 04/28/22 2205   diphenhydrAMINE (BENADRYL) capsule 50 mg  50 mg Oral Q6H PRN [name], [name], MD   50 mg at 04/21/22 1538   Or   diphenhydrAMINE (BENADRYL) injection 50 mg  50 mg Intramuscular Q6H PRN [name], [name], MD       gabapentin (NEURONTIN) capsule 300 mg  300 mg Oral TID [name], [name], MD   300 mg at 04/29/22 1156   haloperidol (HALDOL) tablet 5 mg  5 mg Oral TID [name], [name], MD   5 mg at 04/29/22 1156   haloperidol (HALDOL) tablet 5 mg  5 mg Oral Q6H PRN [name], [name], MD   5 mg at 04/21/22 1538   Or   haloperidol lactate (HALDOL) injection 5 mg  5 mg Intramuscular Q6H PRN [name], [name], MD       magnesium hydroxide (MILK OF MAGNESIA) suspension 30 mL  30 mL Oral Daily PRN [name], [name], MD   30 mL at 04/20/22 1411   metoprolol  succinate (TOPROL-XL) 24 hr tablet 25 mg  25 mg Oral Daily [name], [name], MD   25 mg at 04/29/22 0942   paliperidone (INVEGA SUSTENNA) injection 156 mg  156 mg Intramuscular Q28 days [name], [name], MD   156 mg at 04/26/22 1000   temazepam (RESTORIL) capsule 15 mg  15 mg Oral QHS [name], [name], MD   15 mg at 04/28/22 2205   ziprasidone (GEODON) injection 20 mg  20 mg Intramuscular Q12H PRN [name], [name], MD   20 mg at 03/19/22 1551    Lab Results: No results found for this or any previous visit (from the past 48 hour(s)).  Blood Alcohol level:  Lab Results  Component Value Date   ETH <10 07/20/2021    Metabolic Disorder Labs: Lab Results  Component Value Date   HGBA1C 5.5 04/10/2022   MPG 111.15 04/10/2022   No results found for: PROLACTIN Lab Results  Component Value Date   CHOL 167 11/20/2021   TRIG 107 11/20/2021   HDL 26 (L) 11/20/2021   CHOLHDL 6.4 11/20/2021   VLDL 21 11/20/2021   LDLCALC 120 (H) 11/20/2021    Physical Findings: AIMS:  , ,  ,  ,    CIWA:    COWS:     Musculoskeletal: Strength & Muscle Tone: within normal limits Gait & Station: normal Patient leans: N/A  Psychiatric Specialty Exam:  Presentation  General Appearance: Disheveled  Eye Contact:Fleeting  Speech:Garbled  Speech Volume:Decreased  Handedness:Right   Mood and Affect  Mood:Anxious  Affect:Constricted   Thought Process  Thought Processes:Irrevelant  Descriptions of Associations:Loose  Orientation:None  Thought Content:Illogical  History of Schizophrenia/Schizoaffective disorder:Yes  Duration of Psychotic Symptoms:Greater than six months  Hallucinations:No data recorded Ideas of Reference:Paranoia  Suicidal Thoughts:No data recorded Homicidal Thoughts:No data recorded  Sensorium  Memory:Immediate Poor; Remote Poor  Judgment:Impaired  Insight:Lacking   Executive Functions  Concentration:Poor  Attention Span:Poor  Recall:Poor  Fund of  Knowledge:Poor  Language:Poor   Psychomotor Activity  Psychomotor Activity:No data recorded  Assets  Assets:Housing; Resilience; Social Support; Talents/Skills   Sleep  Sleep:No data recorded   Physical Exam: Physical Exam Vitals and nursing note reviewed.  Constitutional:      Appearance: Normal appearance. He is normal weight.  Neurological:     General: No focal deficit present.     Mental Status: He  is alert and oriented to person, place, and time.  Psychiatric:        Mood and Affect: Mood normal.        Behavior: Behavior normal.   Review of Systems  Constitutional: Negative.   HENT: Negative.    Eyes: Negative.   Respiratory: Negative.    Cardiovascular: Negative.   Gastrointestinal: Negative.   Genitourinary: Negative.   Musculoskeletal: Negative.   Skin: Negative.   Neurological: Negative.   Endo/Heme/Allergies: Negative.   Psychiatric/Behavioral: Negative.    Blood pressure 125/90, pulse 95, temperature 97.6 F (36.4 C), resp. rate 17, height 5\' 8"  (1.727 m), weight 85.8 kg, SpO2 100 %. Body mass index is 28.76 kg/m.   Treatment Plan Summary: Daily contact with patient to assess and evaluate symptoms and progress in treatment, Medication management, and Plan continue current medications.  , DO 04/29/2022, 12:07 PM

## 2022-04-30 LAB — CBC WITH DIFFERENTIAL/PLATELET
Abs Immature Granulocytes: 0.01 10*3/uL (ref 0.00–0.07)
Basophils Absolute: 0.1 10*3/uL (ref 0.0–0.1)
Basophils Relative: 1 %
Eosinophils Absolute: 0 10*3/uL (ref 0.0–0.5)
Eosinophils Relative: 0 %
HCT: 39.9 % (ref 39.0–52.0)
Hemoglobin: 13.5 g/dL (ref 13.0–17.0)
Immature Granulocytes: 0 %
Lymphocytes Relative: 36 %
Lymphs Abs: 1.5 10*3/uL (ref 0.7–4.0)
MCH: 30.2 pg (ref 26.0–34.0)
MCHC: 33.8 g/dL (ref 30.0–36.0)
MCV: 89.3 fL (ref 80.0–100.0)
Monocytes Absolute: 0.6 10*3/uL (ref 0.1–1.0)
Monocytes Relative: 14 %
Neutro Abs: 2 10*3/uL (ref 1.7–7.7)
Neutrophils Relative %: 49 %
Platelets: 166 10*3/uL (ref 150–400)
RBC: 4.47 MIL/uL (ref 4.22–5.81)
RDW: 13.1 % (ref 11.5–15.5)
WBC: 4.1 10*3/uL (ref 4.0–10.5)
nRBC: 0 % (ref 0.0–0.2)

## 2022-04-30 NOTE — Progress Notes (Signed)
Patient alert and oriented x 3 with periods of confusion to situation, no distress noted he continues to be on 1:1, he is receptive to staff and interacting appropriately with staff and peers . Patient denies SI/HI/AVH, complaint with medication regimen. 15 minutes safety checks. 15 minutes safety checks maintained will continue to monitor.

## 2022-04-30 NOTE — Progress Notes (Signed)
D: Patient alert and oriented. Patient denies pain. Patient denies anxiety and depression. Patient denies SI/HI/AVH.  Patient presented in a pleasant mood and childlike during shift.  Assigned sitter has been present with patient during shift.  A: Scheduled medications administered to patient, per MD orders.  Support and encouragement provided to patient.  Q15 minute safety checks maintained.   R: Patient compliant with medication administration and treatment plan. No adverse drug reactions noted. Patient remains safe on the unit at this time.

## 2022-04-30 NOTE — Progress Notes (Signed)
1:1 Patient hourly rounding  0800: Patient is in the dayroom. Patient ate breakfast. Patient compliant with medication administration. Assigned sitter is present with patient.   0900: Patient is in bedroom. Assigned sitter is present with patient.  1000: Patient is in the dayroom. Assigned sitter is present with patient.  1100: Patient is in the dayroom. Assigned sitter is present with patient.   1200: Patient is in the dayroom. Patient ate lunch. Patient compliant with medication administration. Assigned sitter is present with patient.   1300: Patient is in bedroom. Assigned sitter is present with patient.  1400: Patient is in the dayroom. Assigned sitter is present with patient.  1500: Patient is in the dayroom. Assigned sitter is present with patient.   1600: Patient is in the dayroom. Patient has ate dinner. Patient compliant with medication administration. Assigned sitter is present with patient.  1700: Patient is in the dayroom. Assigned sitter is present with patient.  1800: Patient is in the dayroom. Assigned sitter is present with patient.   1900: patient is in the dayroom. Assigned sitter is present with patient.

## 2022-04-30 NOTE — Progress Notes (Signed)
Pharmacy Consult - Clozapine     61 yo male ordered clozapine 300 mg PO qHS    Clozapine [facility] enrollment Verified: yes Clozapine [facility] enrollment: 01/04/22  REMS patient ID: [id number]     Dose Adjustments This Admission:   Decrease dose from 375mg  to 300mg  on  4/10     Dose Adjustments This Admission: 2/3 started on clozapine 25mg   2/10 clozapine 100 mg  2/20 clozapine 175 mg 2/22 clozapine 200 mg  3/06 clozapine 250 mg 3/08 clozapine 300 mg 3/17 clozapine 350 mg 4/10 clozapine 300 mg   Plan: clozapine 300mg  qHS as previously ordered by Dr Weber Cooks Continue weekly ANC    **CLOZAPINE MONITORING** (reflects NEW REMS GUIDELINES EFFECTIVE 09/13/2014): Check ANC at least weekly while inpatient.   For general population patients, i.e., those without benign ethnic neutropenia (BEN): --If [place] 1000-1499, increase ANC monitoring to 3x/wk --If ANC < 1000, HOLD CLOZAPINE and get psych consult

## 2022-04-30 NOTE — Progress Notes (Signed)
Patient on 1:1 with a sitter present for safety. Pt calm and cooperative. Pt compliant with medication administration per MD orders. Pt remains safe on the unit.

## 2022-04-30 NOTE — Plan of Care (Signed)
  Problem: Coping: Goal: Ability to identify and develop effective coping behavior will improve Outcome: Progressing   Problem: Self-Concept: Goal: Ability to identify factors that promote anxiety will improve Outcome: Progressing Goal: Level of anxiety will decrease Outcome: Progressing   

## 2022-04-30 NOTE — Progress Notes (Signed)
[facility] MD Progress Note  04/30/2022 11:22 AM Adam [name]  MRN:  [id number] Subjective: Adam [name] is seen on rounds.  He has a history of TBI and is pretty much noncommunicative.  He states that he is doing okay.  He does not have any complaints or issues.  Nurses tell me that things are unchanged.  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  Past Medical History:  Past Medical History:  Diagnosis Date   Myocardial infarction ([place])    Schizophrenia ([place])    Stroke ([place])    TBI (traumatic brain injury) ([place])    History reviewed. No pertinent surgical history. Family History: History reviewed. No pertinent family history.  Social History:  Social History   Substance and Sexual Activity  Alcohol Use Not Currently     Social History   Substance and Sexual Activity  Drug Use Not Currently    Social History   Socioeconomic History   Marital status: Single    Spouse name: Not on file   Number of children: Not on file   Years of education: Not on file   Highest education level: Not on file  Occupational History   Not on file  Tobacco Use   Smoking status: Never    Passive exposure: Never   Smokeless tobacco: Never  Vaping Use   Vaping Use: Unknown  Substance and Sexual Activity   Alcohol use: Not Currently   Drug use: Not Currently   Sexual activity: Not Currently  Other Topics Concern   Not on file  Social History Narrative   Not on file   Social Determinants of Health   Financial Resource Strain: Not on file  Food Insecurity: Not on file   Transportation Needs: Not on file  Physical Activity: Not on file  Stress: Not on file  Social Connections: Not on file   Additional Social History:                         Sleep: Good  Appetite:  Good  Current Medications: Current Facility-Administered Medications  Medication Dose Route Frequency Provider Last Rate Last Admin   acetaminophen (TYLENOL) tablet 650 mg  650 mg Oral Q6H PRN [name], [name], MD   650 mg at 04/26/22 0637   alum & mag hydroxide-simeth (MAALOX/MYLANTA) 200-200-20 MG/5ML suspension 30 mL  30 mL Oral Q4H PRN [name], [name], MD   30 mL at 04/26/22 [id number]   aspirin EC tablet 81 mg  81 mg Oral Daily [name], [name], MD   81 mg at 04/30/22 0815   atorvastatin (LIPITOR) tablet 20 mg  20 mg Oral Daily [name], [name], MD   20 mg at 04/30/22 0815   atropine 1 % ophthalmic solution 1 drop  1 drop Sublingual QID [name], MD   1 drop at 04/30/22 0816   benztropine (COGENTIN) tablet 0.5 mg  0.5 mg Oral BID [name], [name], MD   0.5 mg at 04/30/22 0815   clonazePAM (KLONOPIN) tablet 1 mg  1 mg Oral TID PRN He, [name], MD   1 mg at 04/29/22 2153   cloZAPine (  CLOZARIL) tablet 300 mg  300 mg Oral QHS [name], [name], MD   300 mg at 04/29/22 2153   diphenhydrAMINE (BENADRYL) capsule 50 mg  50 mg Oral Q6H PRN [name], [name], MD   50 mg at 04/21/22 1538   Or   diphenhydrAMINE (BENADRYL) injection 50 mg  50 mg Intramuscular Q6H PRN [name], [name], MD       gabapentin (NEURONTIN) capsule 300 mg  300 mg Oral TID [name], [name], MD   300 mg at 04/30/22 0815   haloperidol (HALDOL) tablet 5 mg  5 mg Oral TID [name], [name], MD   5 mg at 04/30/22 [id number]   haloperidol (HALDOL) tablet 5 mg  5 mg Oral Q6H PRN [name], [name], MD   5 mg at 04/21/22 1538   Or   haloperidol lactate (HALDOL) injection 5 mg  5 mg Intramuscular Q6H PRN [name], [name], MD       magnesium hydroxide (MILK OF MAGNESIA) suspension 30 mL  30 mL Oral Daily PRN [name], [name], MD   30 mL at 04/20/22  1411   metoprolol succinate (TOPROL-XL) 24 hr tablet 25 mg  25 mg Oral Daily [name], [name], MD   25 mg at 04/30/22 0814   paliperidone (INVEGA SUSTENNA) injection 156 mg  156 mg Intramuscular Q28 days [name], [name], MD   156 mg at 04/26/22 1000   temazepam (RESTORIL) capsule 15 mg  15 mg Oral QHS [name], [name], MD   15 mg at 04/29/22 2153   ziprasidone (GEODON) injection 20 mg  20 mg Intramuscular Q12H PRN [name], [name], MD   20 mg at 03/19/22 1551    Lab Results:  Results for orders placed or performed during the hospital encounter of 03/19/22 (from the past 48 hour(s))  CBC with Differential/Platelet     Status: None   Collection Time: 04/30/22  4:53 AM  Result Value Ref Range   WBC 4.1 4.0 - 10.5 K/uL   RBC 4.47 4.22 - 5.81 MIL/uL   Hemoglobin 13.5 13.0 - 17.0 g/dL   HCT 39.9 39.0 - 52.0 %   MCV 89.3 80.0 - 100.0 fL   MCH 30.2 26.0 - 34.0 pg   MCHC 33.8 30.0 - 36.0 g/dL   RDW 13.1 11.5 - 15.5 %   Platelets 166 150 - 400 K/uL   nRBC 0.0 0.0 - 0.2 %   Neutrophils Relative % 49 %   Neutro Abs 2.0 1.7 - 7.7 K/uL   Lymphocytes Relative 36 %   Lymphs Abs 1.5 0.7 - 4.0 K/uL   Monocytes Relative 14 %   Monocytes Absolute 0.6 0.1 - 1.0 K/uL   Eosinophils Relative 0 %   Eosinophils Absolute 0.0 0.0 - 0.5 K/uL   Basophils Relative 1 %   Basophils Absolute 0.1 0.0 - 0.1 K/uL   Immature Granulocytes 0 %   Abs Immature Granulocytes 0.01 0.00 - 0.07 K/uL    Comment: Performed at [facility], [address] [place],  [address]    Blood Alcohol level:  Lab Results  Component Value Date   [facility] <10 [id number]    Metabolic Disorder Labs: Lab Results  Component Value Date   HGBA1C 5.5 04/10/2022   MPG 111.15 04/10/2022   No results found for: PROLACTIN Lab Results  Component Value Date   CHOL 167 11/20/2021   TRIG 107 11/20/2021   HDL 26 (L) 11/20/2021   CHOLHDL 6.4 11/20/2021   VLDL  21 11/20/2021   LDLCALC 120 (H) 11/20/2021    Physical  Findings: AIMS:  , ,  ,  ,    CIWA:    COWS:     Musculoskeletal: Strength & Muscle Tone: within normal limits Gait & Station: broad based Patient leans: N/A  Psychiatric Specialty Exam:  Presentation  General Appearance: Disheveled  Eye Contact:Fleeting  Speech:Garbled  Speech Volume:Decreased  Handedness:Right   Mood and Affect  Mood:Anxious  Affect:Constricted   Thought Process  Thought Processes:Irrevelant  Descriptions of Associations:Loose  Orientation:None  Thought Content:Illogical  History of Schizophrenia/Schizoaffective disorder:Yes  Duration of Psychotic Symptoms:Greater than six months  Hallucinations:No data recorded Ideas of Reference:Paranoia  Suicidal Thoughts:No data recorded Homicidal Thoughts:No data recorded  Sensorium  Memory:Immediate Poor; Remote Poor  Judgment:Impaired  Insight:Lacking   Executive Functions  Concentration:Poor  Attention Span:Poor  Recall:Poor  Fund of Knowledge:Poor  Language:Poor   Psychomotor Activity  Psychomotor Activity:No data recorded  Assets  Assets:Housing; Resilience; Social Support; Talents/Skills   Sleep  Sleep:No data recorded   Physical Exam: Physical Exam Vitals and nursing note reviewed.  Constitutional:      Appearance: Normal appearance. He is normal weight.  Neurological:     General: No focal deficit present.     Mental Status: He is alert and oriented to person, place, and time.  Psychiatric:        Attention and Perception: Attention and perception normal.        Mood and Affect: Mood normal. Affect is labile and inappropriate.        Speech: He is noncommunicative. Speech is delayed, slurred and tangential.        Behavior: Behavior normal. Behavior is cooperative.        Thought Content: Thought content normal.        Cognition and Memory: Cognition is impaired. Memory is impaired.        Judgment: Judgment is impulsive and inappropriate.   Review of  Systems  Constitutional: Negative.   HENT: Negative.    Eyes: Negative.   Respiratory: Negative.    Cardiovascular: Negative.   Gastrointestinal: Negative.   Genitourinary: Negative.   Musculoskeletal: Negative.   Skin: Negative.   Neurological: Negative.   Endo/Heme/Allergies: Negative.   Psychiatric/Behavioral:  Positive for memory loss.   Blood pressure 125/90, pulse 95, temperature 97.6 F (36.4 C), resp. rate 17, height 5\' 8"  (1.727 m), weight 85.8 kg, SpO2 100 %. Body mass index is 28.76 kg/m.   Treatment Plan Summary: Daily contact with patient to assess and evaluate symptoms and progress in treatment, Medication management, and Plan continue current medications.  [place], DO 04/30/2022, 11:22 AM

## 2022-05-01 MED ORDER — COVID-19MRNA BIVAL VACC PFIZER 30 MCG/0.3ML IM SUSP
0.3000 mL | Freq: Once | INTRAMUSCULAR | Status: AC
  Administered 2022-05-01: 0.3 mL via INTRAMUSCULAR
  Filled 2022-05-01: qty 0.3

## 2022-05-01 NOTE — Progress Notes (Signed)
Recreation Therapy Notes   Date: 05/01/2022   Time: 10:45am     Location: Craft room   Behavioral response: N/A   Intervention Topic: Values   Discussion/Intervention: Patient refused to attend group.    Clinical Observations/Feedback:  Patient refused to attend group.    Argyle Gustafson LRT/CTRS        Mikea Quadros 05/01/2022 11:05 AM

## 2022-05-01 NOTE — Progress Notes (Signed)
Patient at the nurses station he is agitated and yelling at another patient.  He then starts yelling for green pills and drops over and over. Had to be consoled by staff and sitter and removed from the scene.  Once back in his room he was able to be calmed. Was receptive to waiting on QHS meds until after snack. Remains on 1:1 for monitoring and redirection. Will continue to also monitor with q 15 minute safety checks.     C Butler-Nicholson, LPN

## 2022-05-01 NOTE — Progress Notes (Signed)
Patient at the nurses station asking for a razor. Sitter was given razor and shaving cream to help patient with shaving request.     C butler-Nicholson, LPN

## 2022-05-01 NOTE — Progress Notes (Signed)
Speech Language Pathology Treatment: Cognitive-Linquistic  Patient Details Name: Adam [name] MRN: [id number] DOB: 07/12/1961 Today's Date: 05/01/2022 Time: 1157-2620 SLP Time Calculation (min) (ACUTE ONLY): 35 min  Assessment / Plan / Recommendation Clinical Impression  Pt seen for ongoing treatment using pt's TouchTalk device. SLP spoke with nursing staff who report that pt is using device to show them pictures or music of interest to him. As such, device has improved pt's conversation/social interaction with staff. Pt continues to struggle with using device during moments of frustration. This could possibly be related to his mental health illness. Continued education is required to train in possible ways to redirect pt to use device during possible frustration.   Skilled treatment session focused on pt's communication of wants/needs using his TouchTalk Device. Pt was sitting in his room looking at device. After some time and encouragement, pt allowed to look thru device. Pt pointed to and select Dark Purple (music group). Pt was very patient in waiting for SLP to link song to icon. Unfortunately unable to d/t device being locked. Will reach out to company as pt was eager to for me to add to device (smiling and pointing to device).    HPI HPI: ST services were consulted to assess pt's ability with possibility of using a picture board or other such device to assist with communication. 61 year old man with chronic impairment from brain injury on top of schizophrenia. Pt initially under observation status in [facility] ED beginning 07/20/2022 with transfer to inpatient status on 11/20/2021 and then pt transferred to behavioral health unit on 03/19/2022. Most recent head CT reveals Chronic areas of encephalomalacia throughout the left cerebral hemisphere unchanged, so there is concern for possible language impairment.      SLP Plan  Continue with current plan of care      Recommendations for follow up  therapy are one component of a multi-disciplinary discharge planning process, led by the attending physician.  Recommendations may be updated based on patient status, additional functional criteria and insurance authorization.    Recommendations                   Follow Up Recommendations: Follow physician's recommendations for discharge plan and follow up therapies Assistance recommended at discharge: Frequent or constant Supervision/Assistance SLP Visit Diagnosis: Aphasia (R47.01) Plan: Continue with current plan of care         Adam [name], M.S., CCC-SLP, CBIS Speech-Language Pathologist Certified Brain Injury Specialist [facility]  [facility] [phone] Ascom [phone] Fax [phone]   Adam [name]  05/01/2022, 3:11 PM

## 2022-05-01 NOTE — Progress Notes (Signed)
1:1 Patient hourly rounding  0800: Patient ate breakfast. Patient compliant with medication administration. Assigned sitter is present with patient.   0900: Patient is in the dayroom. Assigned sitter is present with patient.  1000: Patient is in the hallway. Assigned sitter is present with patient.   1100: Patient is in bedroom. Assigned sitter is present with patient.   1200: Patient is in bedroom. Patient ate lunch. Patient compliant with medication administration. Assigned sitter is present with patient.   1300: Patient is in the dayroom. Assigned sitter is present with patient.  1400: Patient is in bedroom. Assigned sitter is present with patient.   1500: Patient is in the dayroom. Assigned sitter is present with patient.   1600: Patient is in the dayroom. Patient ate dinner.   1700: Patient is in the dayroom. Patient compliant with medication administration. Covid vaccine administered. No reaction noted in patient. Assigned sitter is present with patient.  1800: Patient is in the dayroom. Assigned sitter is present with patient.  1900: Patient is bedroom. Assigned sitter is present with patient.

## 2022-05-01 NOTE — Progress Notes (Signed)
D: Patient alert and oriented. Patient denies pain. Patient denies anxiety and depression. Patient denies SI/HI/AVH. Patient received Covid vaccination. No adverse reaction noted in patient.  Assigned sitter has been present with patient during shift.   A: Scheduled medications administered to patient, per MD orders. Support and encouragement provided to patient.  Q15 minute safety checks maintained.   R: Patient compliant with medication administration and treatment plan. No adverse drug reactions noted. Patient remains safe on the unit at this time.

## 2022-05-01 NOTE — Progress Notes (Signed)
[facility] MD Progress Note  05/01/2022 10:23 AM Adam [name]  MRN:  [id number] Subjective: Patient seen for follow-up.  No new complaint.  Overall affect almost always seems upbeat.  Sometimes vocalizes loudly but has not been aggressive or violent.  Cooperative and easy to redirect.  Still profound language problems although we continue to believe that he understands some speech.  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 No pertinent surgical history. Family History: History reviewed. No pertinent family history. Family Psychiatric  History: See previous Social History:  Social History   Substance and Sexual Activity  Alcohol Use Not Currently     Social History   Substance and Sexual Activity  Drug Use Not Currently    Social History   Socioeconomic History   Marital status: Single    Spouse name: Not on file   Number of children: Not on file   Years of education: Not on file   Highest education level: Not on file  Occupational History   Not on file  Tobacco Use   Smoking status: Never    Passive exposure: Never   Smokeless tobacco: Never  Vaping Use   Vaping Use: Unknown  Substance and Sexual Activity   Alcohol use: Not Currently   Drug use: Not  Currently   Sexual activity: Not Currently  Other Topics Concern   Not on file  Social History Narrative   Not on file   Social Determinants of Health   Financial Resource Strain: Not on file  Food Insecurity: Not on file  Transportation Needs: Not on file  Physical Activity: Not on file  Stress: Not on file  Social Connections: Not on file   Additional Social History:                         Sleep: Fair  Appetite:  Fair  Current Medications: Current Facility-Administered Medications  Medication Dose Route Frequency Provider Last Rate Last Admin   acetaminophen (TYLENOL) tablet 650 mg  650 mg Oral Q6H PRN Tirrell Buchberger T, MD   650 mg at 04/26/22 0637   alum & mag hydroxide-simeth (MAALOX/MYLANTA) 200-200-20 MG/5ML suspension 30 mL  30 mL Oral Q4H PRN Matthe Sloane T, MD   30 mL at 04/26/22 5625   aspirin EC tablet 81 mg  81 mg Oral Daily Savahanna Almendariz T, MD   81 mg at 05/01/22 0815   atorvastatin (LIPITOR) tablet 20 mg  20 mg Oral Daily Sofiah Lyne T, MD   20 mg at 05/01/22 0816   atropine 1 % ophthalmic solution 1 drop  1 drop Sublingual QID [name], MD   1 drop at 05/01/22 0816   benztropine (COGENTIN) tablet 0.5 mg  0.5 mg Oral BID Lottie Sigman, [name], MD   0.5 mg  at 05/01/22 0815   clonazePAM (KLONOPIN) tablet 1 mg  1 mg Oral TID PRN He, [name], MD   1 mg at 04/29/22 2153   cloZAPine (CLOZARIL) tablet 300 mg  300 mg Oral QHS Adyson Vanburen T, MD   300 mg at 04/30/22 2109   COVID-19 mRNA bivalent vaccine (Pfizer) injection 0.3 mL  0.3 mL Intramuscular Once Dahir Ayer, [name], MD       diphenhydrAMINE (BENADRYL) capsule 50 mg  50 mg Oral Q6H PRN Graham Doukas, [name], MD   50 mg at 04/21/22 1538   Or   diphenhydrAMINE (BENADRYL) injection 50 mg  50 mg Intramuscular Q6H PRN Divon Krabill, [name], MD       gabapentin (NEURONTIN) capsule 300 mg  300 mg Oral TID Georgia Baria, [name], MD   300 mg at 05/01/22 0815   haloperidol (HALDOL) tablet 5 mg  5 mg Oral TID Ayriana Wix, [name], MD   5 mg at  05/01/22 0815   haloperidol (HALDOL) tablet 5 mg  5 mg Oral Q6H PRN Makesha Belitz, [name], MD   5 mg at 04/21/22 1538   Or   haloperidol lactate (HALDOL) injection 5 mg  5 mg Intramuscular Q6H PRN Brayen Bunn, [name], MD       magnesium hydroxide (MILK OF MAGNESIA) suspension 30 mL  30 mL Oral Daily PRN Araina Butrick T, MD   30 mL at 04/20/22 1411   metoprolol succinate (TOPROL-XL) 24 hr tablet 25 mg  25 mg Oral Daily Jerl Munyan T, MD   25 mg at 05/01/22 0816   paliperidone (INVEGA SUSTENNA) injection 156 mg  156 mg Intramuscular Q28 days Dhillon Comunale T, MD   156 mg at 04/26/22 1000   temazepam (RESTORIL) capsule 15 mg  15 mg Oral QHS Nil Bolser T, MD   15 mg at 04/30/22 2109   ziprasidone (GEODON) injection 20 mg  20 mg Intramuscular Q12H PRN Kamden Stanislaw, [name], MD   20 mg at 03/19/22 1551    Lab Results:  Results for orders placed or performed during the hospital encounter of 03/19/22 (from the past 48 hour(s))  CBC with Differential/Platelet     Status: None   Collection Time: 04/30/22  4:53 AM  Result Value Ref Range   WBC 4.1 4.0 - 10.5 K/uL   RBC 4.47 4.22 - 5.81 MIL/uL   Hemoglobin 13.5 13.0 - 17.0 g/dL   HCT 40.9 81.1 - 91.4 %   MCV 89.3 80.0 - 100.0 fL   MCH 30.2 26.0 - 34.0 pg   MCHC 33.8 30.0 - 36.0 g/dL   RDW 78.2 95.6 - 21.3 %   Platelets 166 150 - 400 K/uL   nRBC 0.0 0.0 - 0.2 %   Neutrophils Relative % 49 %   Neutro Abs 2.0 1.7 - 7.7 K/uL   Lymphocytes Relative 36 %   Lymphs Abs 1.5 0.7 - 4.0 K/uL   Monocytes Relative 14 %   Monocytes Absolute 0.6 0.1 - 1.0 K/uL   Eosinophils Relative 0 %   Eosinophils Absolute 0.0 0.0 - 0.5 K/uL   Basophils Relative 1 %   Basophils Absolute 0.1 0.0 - 0.1 K/uL   Immature Granulocytes 0 %   Abs Immature Granulocytes 0.01 0.00 - 0.07 K/uL    Comment: Performed at [facility], [address], [place], [place] [address]    Blood Alcohol level:  Lab Results  Component Value Date   [facility] <10 07/20/2021    Metabolic Disorder  Labs:  Lab Results  Component Value Date   HGBA1C 5.5 04/10/2022   MPG 111.15 04/10/2022   No results found for: PROLACTIN Lab Results  Component Value Date   CHOL 167 11/20/2021   TRIG 107 11/20/2021   HDL 26 (L) 11/20/2021   CHOLHDL 6.4 11/20/2021   VLDL 21 11/20/2021   LDLCALC 120 (H) 11/20/2021    Physical Findings: AIMS:  , ,  ,  ,    CIWA:    COWS:     Musculoskeletal: Strength & Muscle Tone: within normal limits Gait & Station: normal Patient leans: N/A  Psychiatric Specialty Exam:  Presentation  General Appearance: Disheveled  Eye Contact:Fleeting  Speech:Garbled  Speech Volume:Decreased  Handedness:Right   Mood and Affect  Mood:Anxious  Affect:Constricted   Thought Process  Thought Processes:Irrevelant  Descriptions of Associations:Loose  Orientation:None  Thought Content:Illogical  History of Schizophrenia/Schizoaffective disorder:Yes  Duration of Psychotic Symptoms:Greater than six months  Hallucinations:No data recorded Ideas of Reference:Paranoia  Suicidal Thoughts:No data recorded Homicidal Thoughts:No data recorded  Sensorium  Memory:Immediate Poor; Remote Poor  Judgment:Impaired  Insight:Lacking   Executive Functions  Concentration:Poor  Attention Span:Poor  Recall:Poor  Fund of Knowledge:Poor  Language:Poor   Psychomotor Activity  Psychomotor Activity:No data recorded  Assets  Assets:Housing; Resilience; Social Support; Talents/Skills   Sleep  Sleep:No data recorded   Physical Exam: Physical Exam Vitals and nursing note reviewed.  Constitutional:      Appearance: Normal appearance.  HENT:     Head: Normocephalic and atraumatic.     Mouth/Throat:     Pharynx: Oropharynx is clear.  Eyes:     Pupils: Pupils are equal, round, and reactive to light.  Cardiovascular:     Rate and Rhythm: Normal rate and regular rhythm.  Pulmonary:     Effort: Pulmonary effort is normal.     Breath sounds: Normal  breath sounds.  Abdominal:     General: Abdomen is flat.     Palpations: Abdomen is soft.  Musculoskeletal:        General: Normal range of motion.  Skin:    General: Skin is warm and dry.  Neurological:     General: No focal deficit present.     Mental Status: He is alert. Mental status is at baseline.  Psychiatric:        Attention and Perception: He is inattentive.        Mood and Affect: Mood normal. Affect is labile.        Speech: He is noncommunicative.        Thought Content: Thought content normal.   Review of Systems  Unable to perform ROS: Language  Blood pressure (!) 120/92, pulse 99, temperature 97.8 F (36.6 C), resp. rate 17, height 5\' 8"  (1.727 m), weight 85.8 kg, SpO2 100 %. Body mass index is 28.76 kg/m.   Treatment Plan Summary: Medication management and Plan no change to medication.  I went back to check the date we initiated clozapine and it was February 2 so we are coming up on 4 months of clozapine treatment with stable blood counts.  I asked the patient if he would be agreeable to receiving a COVID-vaccine.  Patient indicated yes in a way that I think has some meaning to it.  I attempted to call his mother and once again found that it went to voicemail but there was no way to leave a message.  We have a certain urgency to try to get him placed so I will go ahead  and order a COVID vaccination plan to start today.  Mordecai Rasmussen, MD 05/01/2022, 10:23 AM

## 2022-05-01 NOTE — Plan of Care (Signed)
  Problem: Safety: Goal: Violent Restraint(s) Outcome: Progressing   Problem: Education: Goal: Ability to state activities that reduce stress will improve Outcome: Progressing   Problem: Self-Concept: Goal: Ability to identify factors that promote anxiety will improve Outcome: Progressing Goal: Level of anxiety will decrease Outcome: Progressing

## 2022-05-02 NOTE — Progress Notes (Signed)
Patient in & out of day room and his room. Patient calm but loud at times. Compliant with medications and breakfast. Patient tries to stay away from one of the peers. No aggressive behaviors noted. States " no no " for constipation.Patient with 1:1 sitter.

## 2022-05-02 NOTE — Plan of Care (Signed)
  Problem: Self-Concept: Goal: Level of anxiety will decrease Outcome: Progressing Goal: Ability to modify response to factors that promote anxiety will improve Outcome: Progressing   

## 2022-05-02 NOTE — Progress Notes (Signed)
[facility] MD Progress Note  05/02/2022 10:47 AM Adam [name]  MRN:  [id number] Subjective: No new complaints.  No change to behavior.  No evidence of any clinical change.  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 No pertinent surgical history. Family History: History reviewed. No pertinent family history. Family Psychiatric  History: See previous Social History:  Social History   Substance and Sexual Activity  Alcohol Use Not Currently     Social History   Substance and Sexual Activity  Drug Use Not Currently    Social History   Socioeconomic History   Marital status: Single    Spouse name: Not on file   Number of children: Not on file   Years of education: Not on file   Highest education level: Not on file  Occupational History   Not on file  Tobacco Use   Smoking status: Never    Passive exposure: Never   Smokeless tobacco: Never  Vaping Use   Vaping Use: Unknown  Substance and Sexual Activity   Alcohol use: Not Currently   Drug use: Not Currently   Sexual activity: Not Currently  Other Topics Concern   Not on file  Social History Narrative   Not on file   Social Determinants of Health   Financial Resource Strain: Not on file  Food Insecurity: Not on file  Transportation Needs: Not on file   Physical Activity: Not on file  Stress: Not on file  Social Connections: Not on file   Additional Social History:                         Sleep: Fair  Appetite:  Fair  Current Medications: Current Facility-Administered Medications  Medication Dose Route Frequency Provider Last Rate Last Admin   acetaminophen (TYLENOL) tablet 650 mg  650 mg Oral Q6H PRN Laurette Villescas T, MD   650 mg at 04/26/22 0637   alum & mag hydroxide-simeth (MAALOX/MYLANTA) 200-200-20 MG/5ML suspension 30 mL  30 mL Oral Q4H PRN Yeraldin Litzenberger T, MD   30 mL at 04/26/22 [id number]   aspirin EC tablet 81 mg  81 mg Oral Daily Areebah Meinders, [name], MD   81 mg at 05/02/22 0724   atorvastatin (LIPITOR) tablet 20 mg  20 mg Oral Daily Vi Biddinger T, MD   20 mg at 05/02/22 0724   atropine 1 % ophthalmic solution 1 drop  1 drop Sublingual QID [name], MD   1 drop at 05/01/22 2224   benztropine (COGENTIN) tablet 0.5 mg  0.5 mg Oral BID Manoj Enriquez T, MD   0.5 mg at 05/02/22 0725   clonazePAM (KLONOPIN) tablet 1 mg  1 mg Oral TID PRN He, [name], MD   1 mg at 05/02/22 [id number]   cloZAPine (CLOZARIL) tablet 300 mg  300 mg Oral QHS Lamoyne Hessel, [name], MD  300 mg at 05/01/22 2223   diphenhydrAMINE (BENADRYL) capsule 50 mg  50 mg Oral Q6H PRN Dillian Feig T, MD   50 mg at 05/01/22 2223   Or   diphenhydrAMINE (BENADRYL) injection 50 mg  50 mg Intramuscular Q6H PRN Jordyn Hofacker T, MD       gabapentin (NEURONTIN) capsule 300 mg  300 mg Oral TID Robinson Brinkley T, MD   300 mg at 05/02/22 [id number]   haloperidol (HALDOL) tablet 5 mg  5 mg Oral TID Mataeo Ingwersen T, MD   5 mg at 05/02/22 [id number]   haloperidol (HALDOL) tablet 5 mg  5 mg Oral Q6H PRN Wilma Michaelson T, MD   5 mg at 05/01/22 2223   Or   haloperidol lactate (HALDOL) injection 5 mg  5 mg Intramuscular Q6H PRN Bernhard Koskinen T, MD       magnesium hydroxide (MILK OF MAGNESIA) suspension 30 mL  30 mL Oral Daily PRN Kayslee Furey T, MD   30 mL at 05/02/22 [id number]   metoprolol succinate  (TOPROL-XL) 24 hr tablet 25 mg  25 mg Oral Daily Lula Michaux T, MD   25 mg at 05/02/22 0725   paliperidone (INVEGA SUSTENNA) injection 156 mg  156 mg Intramuscular Q28 days Malisha Mabey T, MD   156 mg at 04/26/22 1000   temazepam (RESTORIL) capsule 15 mg  15 mg Oral QHS Zyeir Dymek T, MD   15 mg at 05/01/22 2223   ziprasidone (GEODON) injection 20 mg  20 mg Intramuscular Q12H PRN Neko Mcgeehan, [name], MD   20 mg at 03/19/22 1551    Lab Results: No results found for this or any previous visit (from the past 48 hour(s)).  Blood Alcohol level:  Lab Results  Component Value Date   ETH <10 [id number]    Metabolic Disorder Labs: Lab Results  Component Value Date   HGBA1C 5.5 04/10/2022   MPG 111.15 04/10/2022   No results found for: PROLACTIN Lab Results  Component Value Date   CHOL 167 11/20/2021   TRIG 107 11/20/2021   HDL 26 (L) 11/20/2021   CHOLHDL 6.4 11/20/2021   VLDL 21 11/20/2021   LDLCALC 120 (H) 11/20/2021    Physical Findings: AIMS:  , ,  ,  ,    CIWA:    COWS:     Musculoskeletal: Strength & Muscle Tone: within normal limits Gait & Station: normal Patient leans: N/A  Psychiatric Specialty Exam:  Presentation  General Appearance: Disheveled  Eye Contact:Fleeting  Speech:Garbled  Speech Volume:Decreased  Handedness:Right   Mood and Affect  Mood:Anxious  Affect:Constricted   Thought Process  Thought Processes:Irrevelant  Descriptions of Associations:Loose  Orientation:None  Thought Content:Illogical  History of Schizophrenia/Schizoaffective disorder:Yes  Duration of Psychotic Symptoms:Greater than six months  Hallucinations:No data recorded Ideas of Reference:Paranoia  Suicidal Thoughts:No data recorded Homicidal Thoughts:No data recorded  Sensorium  Memory:Immediate Poor; Remote Poor  Judgment:Impaired  Insight:Lacking   Executive Functions  Concentration:Poor  Attention Span:Poor  Recall:Poor  Fund of  Knowledge:Poor  Language:Poor   Psychomotor Activity  Psychomotor Activity:No data recorded  Assets  Assets:Housing; Resilience; Social Support; Talents/Skills   Sleep  Sleep:No data recorded   Physical Exam: Physical Exam Vitals and nursing note reviewed.  Constitutional:      Appearance: Normal appearance.  HENT:     Head: Normocephalic and atraumatic.     Mouth/Throat:     Pharynx: Oropharynx is clear.  Eyes:     Pupils: Pupils are equal, round, and reactive  to light.  Cardiovascular:     Rate and Rhythm: Normal rate and regular rhythm.  Pulmonary:     Effort: Pulmonary effort is normal.     Breath sounds: Normal breath sounds.  Abdominal:     General: Abdomen is flat.     Palpations: Abdomen is soft.  Musculoskeletal:        General: Normal range of motion.  Skin:    General: Skin is warm and dry.  Neurological:     General: No focal deficit present.     Mental Status: He is alert. Mental status is at baseline.  Psychiatric:        Attention and Perception: He is inattentive.        Mood and Affect: Mood normal. Affect is labile and inappropriate.        Speech: He is noncommunicative.        Thought Content: Thought content normal.   Review of Systems  Unable to perform ROS: Language  Blood pressure (!) 120/92, pulse 98, temperature 97.8 F (36.6 C), resp. rate 17, height 5\' 8"  (1.727 m), weight 85.8 kg, SpO2 100 %. Body mass index is 28.76 kg/m.   Treatment Plan Summary: Plan patient is stable awaiting review for possible placement  Alethia Berthold, MD 05/02/2022, 10:47 AM

## 2022-05-02 NOTE — Progress Notes (Signed)
1:1 Patient Hourly Rounding  0800: Patient is in the dayroom, eating breakfast, with Maryelizabeth Kaufmann, RN as his assigned Recruitment consultant.  0900: Patient is in the dayroom, with his assigned safety sitter Gigi, RN.  1000: Patient is in his room, with Maryelizabeth Kaufmann, RN as his assigned Recruitment consultant.  1100: Patient is in the community room, with Maryelizabeth Kaufmann, Charity fundraiser as his assigned Recruitment consultant.  1200: Patient is in the dayroom, with staff and other members on the unit. Patient's assigned Recruitment consultant is Soil scientist, Charity fundraiser.

## 2022-05-02 NOTE — Progress Notes (Signed)
Patient on 1:1 until asleep. Pt calm and cooperative this evening. Pt compliant with medication administration per MD orders. Pt being monitored Q 15 minutes for safety per unit protocol. Pt remains safe on the unit.

## 2022-05-02 NOTE — Progress Notes (Signed)
Patient received QHS medication without incident. He is much calmer and cooperative since being allowed to shave. He denies si hi avh depression anxiety and pain when questioned.  Reports not having a good day today when asked how his day went. Remains with a 1:1 sitter for monitoring and redirection. Q15 minute safety checks also on place.       C Butler-Nicholson, LPN

## 2022-05-02 NOTE — Progress Notes (Signed)
13:00  Patient in his his room with the I pad. Safety sitter with patient.  14:00 Patient in his room with safety sitter.  15:00  Patient in the day room with peers. Appropriate with staff & peers.  16:00 Patient in the day room with safety sitter.  17:00  Patient had dinner.Compliant with medications. Safety maintained with sitter.  18:00  Patient in his room using phone. Safety sitter with patient.

## 2022-05-02 NOTE — Progress Notes (Signed)
Patient is agitated this morning.  Set off by another patient on the unit. PRN medication given to help calm him down.  Remains with 1:1 sitter.  Will continue to  monitor with q 15 minute safety checks.     C Butler-Nicholson, LPN

## 2022-05-02 NOTE — Progress Notes (Signed)
Recreation Therapy Notes  Date: 05/02/2022   Time: 10:35 A.M.   Location: Craft room    Behavioral response: N/A   Intervention Topic: Decision Making    Discussion/Intervention: Patient refused to attend group.    Clinical Observations/Feedback:  Patient refused to attend group.    Chiquitta Matty LRT/CTRS         Clairessa Boulet 05/02/2022 11:11 AM 

## 2022-05-03 NOTE — Progress Notes (Signed)
Patient calm and cooperative. Pt observed interacting appropriately with staff and peers on the unit. Pt compliant with medication administration per MD orders. Pt remains safe on the unit.

## 2022-05-03 NOTE — Progress Notes (Signed)
D: Patient alert and oriented. Patient denies pain. Patient denies anxiety and depression. Patient denies SI/HI/AVH. Patient became irritable and agitated in the hallways and yelling waiting at the doctors door. Patient redirected to the back hall by staff. Patient began banging on the doors of the back hall. PRN medication for agitation provided to patient PO. Patient compliant with PRN medication.  Patient has calmed down, and has requested a phone to call his mother.   A: Scheduled medications administered to patient, per MD orders.  Support and encouragement provided to patient.  Q15 minute safety checks maintained.   R: Patient compliant with medication administration and treatment plan. No adverse drug reactions noted. Patient remains safe on the unit at this time.

## 2022-05-03 NOTE — Progress Notes (Signed)
1:1 Patient hourly rounding  0800: Patient is in bed, sleeping. Assigned sitter is present with patient.  0900: Patient is in bed, sleeping. Assigned sitter is present with patient.   1000: Patient went outside for recreation therapy group. Assigned sitter is present with patient.   1100: Patient provided and ate meal tray.  Patient compliant with medication administration.   1200: Patient is in bedroom.  Assigned sitter is present with patient.   1300: Patient is in bedroom. Assigned sitter is present with patient.  1400: Patient is in bedroom. Assigned sitter is present with patient.   1500: Patient is in dayroom, listening to music with staff. Assigned sitter is present with patient.   1600: Patient is in the dayroom. Dinner was provided to patient. Patient compliant with medication administration. Assigned sitter is present with patient.  1700: Patient in the hallway waiting at the doctors door. Patient irritable, loudly yelling and disturbing other patients. Patient redirected to back hall by staff. Assigned sitter is present with patient.   1800: Patient in the hallway of the back hall. Patient irritable and agitated. PO PRN medication provided to patient for agitation. Assigned sitter is present with patient.   1900: Patient calm and composed. Patient is in bedroom. Assigned sitter is present with patient.

## 2022-05-03 NOTE — Progress Notes (Addendum)
Pt agitated and came to the medication room wanting something. PO PRN medication given but patient spit it out. Pt became more agitated and was yelling. PRN medication administered per MD orders (Check MAR)

## 2022-05-03 NOTE — Progress Notes (Signed)
Recreation Therapy Notes  Date: 05/03/2022  Time: 10:15am    Location: Courtyard    Behavioral response: Appropriate  Intervention Topic:  Social Skills    Discussion/Intervention:  Group content on today was focused on social skills. The group defined social skills and identified ways they use social skills. Patients expressed what obstacles they face when trying to be social. Participants described the importance of social skills. The group listed ways to improve social skills and reasons to improve social skills. Individuals had an opportunity to learn new and improve social skills as well as identify their weaknesses. Clinical Observations/Feedback: Patient came to group and was able to be appropriate with staff and peers. Individual was social with peers and staff while participating in the intervention.    Caliope Ruppert LRT/CTRS         Gaylen Venning 05/03/2022 11:45 AM 

## 2022-05-03 NOTE — Progress Notes (Signed)
[name] MD Progress Note  05/03/2022 4:09 PM Adam [name]  MRN:  [id number] Subjective: Follow-up patient with schizophrenia and brain injury.  Patient no different than any other day today.  Vocalizes loudly at times but not aggressive.  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 No pertinent surgical history. Family History: History reviewed. No pertinent family history. Family Psychiatric  History: See previous Social History:  Social History   Substance and Sexual Activity  Alcohol Use Not Currently     Social History   Substance and Sexual Activity  Drug Use Not Currently    Social History   Socioeconomic History   Marital status: Single    Spouse name: Not on file   Number of children: Not on file   Years of education: Not on file   Highest education level: Not on file  Occupational History   Not on file  Tobacco Use   Smoking status: Never    Passive exposure: Never   Smokeless tobacco: Never  Vaping Use   Vaping Use: Unknown  Substance and Sexual Activity   Alcohol use: Not Currently   Drug use: Not Currently   Sexual activity: Not Currently  Other Topics Concern   Not on file  Social History Narrative   Not on file   Social Determinants of Health   Financial Resource Strain: Not on file  Food  Insecurity: Not on file  Transportation Needs: Not on file  Physical Activity: Not on file  Stress: Not on file  Social Connections: Not on file   Additional Social History:                         Sleep: Fair  Appetite:  Fair  Current Medications: Current Facility-Administered Medications  Medication Dose Route Frequency Provider Last Rate Last Admin   acetaminophen (TYLENOL) tablet 650 mg  650 mg Oral Q6H PRN [name] T, MD   650 mg at 04/26/22 0637   alum & mag hydroxide-simeth (MAALOX/MYLANTA) 200-200-20 MG/5ML suspension 30 mL  30 mL Oral Q4H PRN [name] T, MD   30 mL at 04/26/22 [id number]   aspirin EC tablet 81 mg  81 mg Oral Daily [name], [name], MD   81 mg at 05/03/22 1117   atorvastatin (LIPITOR) tablet 20 mg  20 mg Oral Daily [name] T, MD   20 mg at 05/03/22 1117   atropine 1 % ophthalmic solution 1 drop  1 drop Sublingual QID [name], MD   1 drop at 05/03/22 1117   benztropine (COGENTIN) tablet 0.5 mg  0.5 mg Oral BID [name] T, MD   0.5 mg at 05/03/22 1117   clonazePAM (KLONOPIN) tablet 1 mg  1 mg Oral TID PRN He, [name], MD   1 mg at 05/02/22 0619   cloZAPine (CLOZARIL) tablet 300 mg  300 mg Oral QHS [name] T, MD   300 mg at 05/02/22 2048   diphenhydrAMINE (BENADRYL) capsule 50 mg  50 mg Oral Q6H PRN [name] T, MD   50 mg at 05/01/22 2223   Or   diphenhydrAMINE (BENADRYL) injection 50 mg  50 mg Intramuscular Q6H PRN Mak Bonny T, MD       gabapentin (NEURONTIN) capsule 300 mg  300 mg Oral TID [name] T, MD   300 mg at 05/03/22 1117   haloperidol (HALDOL) tablet 5 mg  5 mg Oral TID [name] T, MD   5 mg at 05/03/22 1117   haloperidol (HALDOL) tablet 5 mg  5 mg Oral Q6H PRN [name] T, MD   5 mg at 05/01/22 2223   Or   haloperidol lactate (HALDOL) injection 5 mg  5 mg Intramuscular Q6H PRN [name] T, MD       magnesium hydroxide (MILK OF MAGNESIA) suspension 30 mL  30 mL Oral Daily PRN [name] T, MD    30 mL at 05/02/22 [id number]   metoprolol succinate (TOPROL-XL) 24 hr tablet 25 mg  25 mg Oral Daily [name] T, MD   25 mg at 05/03/22 1117   paliperidone (INVEGA SUSTENNA) injection 156 mg  156 mg Intramuscular Q28 days [name] T, MD   156 mg at 04/26/22 1000   temazepam (RESTORIL) capsule 15 mg  15 mg Oral QHS [name] T, MD   15 mg at 05/02/22 2048   ziprasidone (GEODON) injection 20 mg  20 mg Intramuscular Q12H PRN [name], [name], MD   20 mg at 05/03/22 0103    Lab Results: No results found for this or any previous visit (from the past 39 hour(s)).  Blood Alcohol level:  Lab Results  Component Value Date   ETH <10 [id number]    Metabolic Disorder Labs: Lab Results  Component Value Date   HGBA1C 5.5 04/10/2022   MPG 111.15 04/10/2022   No results found for: PROLACTIN Lab Results  Component Value Date   CHOL 167 11/20/2021   TRIG 107 11/20/2021   HDL 26 (L) 11/20/2021   CHOLHDL 6.4 11/20/2021   VLDL 21 11/20/2021   LDLCALC 120 (H) 11/20/2021    Physical Findings: AIMS:  , ,  ,  ,    CIWA:    COWS:     Musculoskeletal: Strength & Muscle Tone: within normal limits Gait & Station: normal Patient leans: N/A  Psychiatric Specialty Exam:  Presentation  General Appearance: Disheveled  Eye Contact:Fleeting  Speech:Garbled  Speech Volume:Decreased  Handedness:Right   Mood and Affect  Mood:Anxious  Affect:Constricted   Thought Process  Thought Processes:Irrevelant  Descriptions of Associations:Loose  Orientation:None  Thought Content:Illogical  History of Schizophrenia/Schizoaffective disorder:Yes  Duration of Psychotic Symptoms:Greater than six months  Hallucinations:No data recorded Ideas of Reference:Paranoia  Suicidal Thoughts:No data recorded Homicidal Thoughts:No data recorded  Sensorium  Memory:Immediate Poor; Remote Poor  Judgment:Impaired  Insight:Lacking   Executive Functions  Concentration:Poor  Attention  Span:Poor  Recall:Poor  Fund of Knowledge:Poor  Language:Poor   Psychomotor Activity  Psychomotor Activity:No data recorded  Assets  Assets:Housing; Resilience; Social Support; Talents/Skills   Sleep  Sleep:No data recorded   Physical Exam: Physical Exam Vitals and nursing note reviewed.  Constitutional:      Appearance: Normal appearance.  HENT:     Head: Normocephalic and atraumatic.     Mouth/Throat:     Pharynx: Oropharynx is clear.  Eyes:  Pupils: Pupils are equal, round, and reactive to light.  Cardiovascular:     Rate and Rhythm: Normal rate and regular rhythm.  Pulmonary:     Effort: Pulmonary effort is normal.     Breath sounds: Normal breath sounds.  Abdominal:     General: Abdomen is flat.     Palpations: Abdomen is soft.  Musculoskeletal:        General: Normal range of motion.  Skin:    General: Skin is warm and dry.  Neurological:     General: No focal deficit present.     Mental Status: He is alert. Mental status is at baseline.  Psychiatric:        Attention and Perception: He is inattentive.        Mood and Affect: Affect is inappropriate.        Thought Content: Thought content normal.   Review of Systems  Unable to perform ROS: Language  Blood pressure (!) 120/92, pulse 98, temperature 97.8 F (36.6 C), resp. rate 17, height 5\' 8"  (1.727 m), weight 85.8 kg, SpO2 100 %. Body mass index is 28.76 kg/m.   Treatment Plan Summary: Medication management and Plan continue current medication.  No change to overall treatment plan.  Alethia Berthold, MD 05/03/2022, 4:09 PM

## 2022-05-03 NOTE — Plan of Care (Signed)
  Problem: Safety: Goal: Violent Restraint(s) Outcome: Progressing   Problem: Coping: Goal: Ability to identify and develop effective coping behavior will improve Outcome: Progressing   Problem: Self-Concept: Goal: Ability to identify factors that promote anxiety will improve Outcome: Progressing Goal: Level of anxiety will decrease Outcome: Progressing

## 2022-05-04 NOTE — Progress Notes (Signed)
1:1 Hourly Rounding  0730: Pt in hallway with sitter present.  0830: Pt in bedroom with sitter present.  0930: Pt in hallway with sitter present.  1030: Pt in dayroom with sitter present.  1130: Pt in hallway with sitter present.  1230: Pt in bedroom with sitter present.  1330: Pt in dayroom with sitter present.  1430: Pt in bedroom sleeping.  1530: Pt in bedroom sleeping.  1630: Pt in cafeteria eating with MHT.  1730: Pt in bedroom with MHT.  1830: Pt in  dayroom with MHT.  1930:

## 2022-05-04 NOTE — Progress Notes (Signed)
Recreation Therapy Notes   Date: 05/04/2022  Time: 10:40 am   Location: Craft room        Behavioral response: N/A   Intervention Topic: Honesty    Discussion/Intervention: Patient refused to attend group.   Clinical Observations/Feedback:  Patient refused to attend group.    Raylan Troiani LRT/CTRS        Adam [name] 05/04/2022 11:12 AM 

## 2022-05-04 NOTE — Plan of Care (Signed)
D: Pt alert and denies experiencing any anxiety/depression at this time. Pt denies experiencing any pain at this time. Pt denies experiencing any SI/HI, or AVH at this time.   A: Scheduled medications administered to pt, per MD orders. Support and encouragement provided. Frequent verbal contact made. Routine safety checks conducted q15 minutes and remains on continuous 1:1.   R: No adverse drug reactions noted. Pt verbally contracts for safety at this time. Pt compliant with medications and treatment plan. Pt interacts well with others on the unit. Pt remains safe at this time. Will continue to monitor.   Problem: Education: Goal: Ability to state activities that reduce stress will improve Outcome: Not Progressing   Problem: Coping: Goal: Ability to identify and develop effective coping behavior will improve Outcome: Not Progressing

## 2022-05-04 NOTE — Progress Notes (Signed)
[facility] MD Progress Note  05/04/2022 10:32 AM Adam [name]  MRN:  [id number] Subjective: Follow-up for this patient with brain injury and schizophrenia.  No real change in presentation.  Sometimes he will vocalize loudly but it seems more like he is just entertaining himself than anything else.  Certainly not aggressive.  No self-harm behavior.  Cooperative with medicine.  Only really gets upset when he does not have attention. 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 No pertinent surgical history. Family History: History reviewed. No pertinent family history. Family Psychiatric  History: See previous Social History:  Social History   Substance and Sexual Activity  Alcohol Use Not Currently     Social History   Substance and Sexual Activity  Drug Use Not Currently    Social History   Socioeconomic History   Marital status: Single    Spouse name: Not on file   Number of children: Not on file   Years of education: Not on file   Highest education level: Not on file  Occupational History   Not on file  Tobacco Use   Smoking status: Never    Passive exposure: Never   Smokeless tobacco: Never  Vaping Use   Vaping Use: Unknown  Substance and Sexual Activity   Alcohol use: Not Currently   Drug use: Not Currently   Sexual activity: Not Currently  Other  Topics Concern   Not on file  Social History Narrative   Not on file   Social Determinants of Health   Financial Resource Strain: Not on file  Food Insecurity: Not on file  Transportation Needs: Not on file  Physical Activity: Not on file  Stress: Not on file  Social Connections: Not on file   Additional Social History:                         Sleep: Fair  Appetite:  Fair  Current Medications: Current Facility-Administered Medications  Medication Dose Route Frequency Provider Last Rate Last Admin   acetaminophen (TYLENOL) tablet 650 mg  650 mg Oral Q6H PRN Najeh Credit T, MD   650 mg at 04/26/22 0637   alum & mag hydroxide-simeth (MAALOX/MYLANTA) 200-200-20 MG/5ML suspension 30 mL  30 mL Oral Q4H PRN Deeric Cruise T, MD   30 mL at 04/26/22 7510   aspirin EC tablet 81 mg  81 mg Oral Daily Leen Tworek, [name], MD   81 mg at 05/04/22 0731   atorvastatin (LIPITOR) tablet 20 mg  20 mg Oral Daily Christasia Angeletti T, MD   20 mg at 05/04/22 0731   atropine 1 % ophthalmic solution 1 drop  1 drop Sublingual QID [name], MD   1 drop at 05/04/22 0731   benztropine (COGENTIN) tablet 0.5 mg  0.5 mg Oral BID Wyland Rastetter T, MD   0.5 mg at 05/04/22 0731   clonazePAM (KLONOPIN) tablet 1  mg  1 mg Oral TID PRN He, [name], MD   1 mg at 05/02/22 7741   cloZAPine (CLOZARIL) tablet 300 mg  300 mg Oral QHS Christasia Angeletti T, MD   300 mg at 05/03/22 2106   diphenhydrAMINE (BENADRYL) capsule 50 mg  50 mg Oral Q6H PRN Deronte Solis T, MD   50 mg at 05/03/22 1813   Or   diphenhydrAMINE (BENADRYL) injection 50 mg  50 mg Intramuscular Q6H PRN Josep Luviano T, MD       gabapentin (NEURONTIN) capsule 300 mg  300 mg Oral TID Zarrah Loveland, [name], MD   300 mg at 05/04/22 0731   haloperidol (HALDOL) tablet 5 mg  5 mg Oral TID Bryton Waight, [name], MD   5 mg at 05/04/22 0731   haloperidol (HALDOL) tablet 5 mg  5 mg Oral Q6H PRN Benelli Winther T, MD   5 mg at 05/03/22 1813   Or   haloperidol lactate (HALDOL) injection 5  mg  5 mg Intramuscular Q6H PRN Kenechukwu Eckstein T, MD       magnesium hydroxide (MILK OF MAGNESIA) suspension 30 mL  30 mL Oral Daily PRN Marivel Mcclarty T, MD   30 mL at 05/02/22 2878   metoprolol succinate (TOPROL-XL) 24 hr tablet 25 mg  25 mg Oral Daily Neri Samek T, MD   25 mg at 05/04/22 0731   paliperidone (INVEGA SUSTENNA) injection 156 mg  156 mg Intramuscular Q28 days Mariea Mcmartin T, MD   156 mg at 04/26/22 1000   temazepam (RESTORIL) capsule 15 mg  15 mg Oral QHS Calin Fantroy T, MD   15 mg at 05/03/22 2106   ziprasidone (GEODON) injection 20 mg  20 mg Intramuscular Q12H PRN Darianny Momon, [name], MD   20 mg at 05/03/22 0103    Lab Results: No results found for this or any previous visit (from the past 48 hour(s)).  Blood Alcohol level:  Lab Results  Component Value Date   ETH <10 07/20/2021    Metabolic Disorder Labs: Lab Results  Component Value Date   HGBA1C 5.5 04/10/2022   MPG 111.15 04/10/2022   No results found for: PROLACTIN Lab Results  Component Value Date   CHOL 167 11/20/2021   TRIG 107 11/20/2021   HDL 26 (L) 11/20/2021   CHOLHDL 6.4 11/20/2021   VLDL 21 11/20/2021   LDLCALC 120 (H) 11/20/2021    Physical Findings: AIMS:  , ,  ,  ,    CIWA:    COWS:     Musculoskeletal: Strength & Muscle Tone: within normal limits Gait & Station: normal Patient leans: N/A  Psychiatric Specialty Exam:  Presentation  General Appearance: Disheveled  Eye Contact:Fleeting  Speech:Garbled  Speech Volume:Decreased  Handedness:Right   Mood and Affect  Mood:Anxious  Affect:Constricted   Thought Process  Thought Processes:Irrevelant  Descriptions of Associations:Loose  Orientation:None  Thought Content:Illogical  History of Schizophrenia/Schizoaffective disorder:Yes  Duration of Psychotic Symptoms:Greater than six months  Hallucinations:No data recorded Ideas of Reference:Paranoia  Suicidal Thoughts:No data recorded Homicidal Thoughts:No data  recorded  Sensorium  Memory:Immediate Poor; Remote Poor  Judgment:Impaired  Insight:Lacking   Executive Functions  Concentration:Poor  Attention Span:Poor  Recall:Poor  Fund of Knowledge:Poor  Language:Poor   Psychomotor Activity  Psychomotor Activity:No data recorded  Assets  Assets:Housing; Resilience; Social Support; Talents/Skills   Sleep  Sleep:No data recorded   Physical Exam: Physical Exam Vitals and nursing note reviewed.  Constitutional:      Appearance: Normal appearance.  HENT:     Head: Normocephalic and atraumatic.     Mouth/Throat:     Pharynx: Oropharynx is clear.  Eyes:     Pupils: Pupils are equal, round, and reactive to light.  Cardiovascular:     Rate and Rhythm: Normal rate and regular rhythm.  Pulmonary:     Effort: Pulmonary effort is normal.     Breath sounds: Normal breath sounds.  Abdominal:     General: Abdomen is flat.     Palpations: Abdomen is soft.  Musculoskeletal:        General: Normal range of motion.  Skin:    General: Skin is warm and dry.  Neurological:     General: No focal deficit present.     Mental Status: He is alert. Mental status is at baseline.  Psychiatric:        Attention and Perception: He is inattentive.        Mood and Affect: Affect is inappropriate.        Speech: He is noncommunicative.        Thought Content: Thought content normal.   Review of Systems  Unable to perform ROS: Language  Blood pressure 118/72, pulse 95, temperature 97.9 F (36.6 C), temperature source Oral, resp. rate 17, height 5\' 8"  (1.727 m), weight 85.8 kg, SpO2 98 %. Body mass index is 28.76 kg/m.   Treatment Plan Summary: Plan no change to medication.  Engage in activities and recreation as tolerated by his behavior.  Next COVID-vaccine probably have to be about a week and a couple days.  Mordecai Rasmussen, MD 05/04/2022, 10:32 AM

## 2022-05-04 NOTE — TOC Progression Note (Signed)
Transition of Care [facility]) - Progression Note    Patient Details  Name: Adam [name] MRN: [id number] Date of Birth: February 27, 1961  [facility]) CM/SW Contact  Graham Cellar, RN Phone Number: 05/04/2022, 1:49 PM  Clinical Narrative:    Facility requesting patient receive COVID vaccine and requesting MAR list be faxed [phone] showing day to day medications and any refusals for the month of May. Sent updated MAR and update of initial COVID given and awaiting second dose.         Expected Discharge Plan and Services                                                 Social Determinants of Health (SDOH) Interventions    Readmission Risk Interventions     View : No data to display.

## 2022-05-05 NOTE — Group Note (Signed)
LCSW Group Therapy Note  Group Date: 05/05/2022 Start Time: 1300 End Time: 1400   Type of Therapy and Topic:  Group Therapy - Healthy vs Unhealthy Coping Skills  Participation Level:  Did Not Attend   Description of Group The focus of this group was to determine what unhealthy coping techniques typically are used by group members and what healthy coping techniques would be helpful in coping with various problems. Patients were guided in becoming aware of the differences between healthy and unhealthy coping techniques. Patients were asked to identify 2-3 healthy coping skills they would like to learn to use more effectively.  Therapeutic Goals Patients learned that coping is what human beings do all day long to deal with various situations in their lives Patients defined and discussed healthy vs unhealthy coping techniques Patients identified their preferred coping techniques and identified whether these were healthy or unhealthy Patients determined 2-3 healthy coping skills they would like to become more familiar with and use more often. Patients provided support and ideas to each other   Summary of Patient Progress:Due to an influx of patients, group was not held on the unit.    Therapeutic Modalities Cognitive Behavioral Therapy Motivational Interviewing  Berniece Salines, Latanya Presser 05/05/2022  3:35 PM

## 2022-05-05 NOTE — Progress Notes (Signed)
Pt is more animated, interactive with 1:1 staff. Hyperactive, restless / paces in milieu at intervals but is verbally redirectable. Visible in dayroom but did not attend groups this shift. Complaint with noon medications. Assigned staff in attendance at all times. 1:1 observation maintained without incident.

## 2022-05-05 NOTE — Progress Notes (Signed)
[facility] MD Progress Note  05/05/2022 12:45 PM Marx Doig  MRN:  [id number] Subjective: Patient has no new complaints.  Continues to intermittently vocalize but is redirectable.  Not acting out aggressively.  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 No pertinent surgical history. Family History: History reviewed. No pertinent family history. Family Psychiatric  History: See previous Social History:  Social History   Substance and Sexual Activity  Alcohol Use Not Currently     Social History   Substance and Sexual Activity  Drug Use Not Currently    Social History   Socioeconomic History   Marital status: Single    Spouse name: Not on file   Number of children: Not on file   Years of education: Not on file   Highest education level: Not on file  Occupational History   Not on file  Tobacco Use   Smoking status: Never    Passive exposure: Never   Smokeless tobacco: Never  Vaping Use   Vaping Use: Unknown  Substance and Sexual Activity   Alcohol use: Not Currently   Drug use: Not Currently   Sexual activity: Not Currently  Other Topics Concern   Not on file  Social History Narrative   Not on file   Social Determinants of Health   Financial Resource Strain: Not on file  Food Insecurity: Not on file  Transportation Needs: Not on  file  Physical Activity: Not on file  Stress: Not on file  Social Connections: Not on file   Additional Social History:                         Sleep: Fair  Appetite:  Fair  Current Medications: Current Facility-Administered Medications  Medication Dose Route Frequency Provider Last Rate Last Admin   acetaminophen (TYLENOL) tablet 650 mg  650 mg Oral Q6H PRN Ranesha Val T, MD   650 mg at 04/26/22 0637   alum & mag hydroxide-simeth (MAALOX/MYLANTA) 200-200-20 MG/5ML suspension 30 mL  30 mL Oral Q4H PRN Peola Joynt T, MD   30 mL at 04/26/22 2671   aspirin EC tablet 81 mg  81 mg Oral Daily Tahara Ruffini, [name], MD   81 mg at 05/05/22 0737   atorvastatin (LIPITOR) tablet 20 mg  20 mg Oral Daily Jacorey Donaway T, MD   20 mg at 05/05/22 0737   atropine 1 % ophthalmic solution 1 drop  1 drop Sublingual QID [name], MD   1 drop at 05/05/22 1230   benztropine (COGENTIN) tablet 0.5 mg  0.5 mg Oral BID Mata Rowen T, MD   0.5 mg at 05/05/22 0737   clonazePAM (KLONOPIN) tablet 1 mg  1 mg Oral TID PRN He, [name], MD   1 mg at 05/05/22 2458   cloZAPine (CLOZARIL) tablet 300 mg  300 mg Oral QHS Cherina Dhillon, [name], MD   300  mg at 05/04/22 2153   diphenhydrAMINE (BENADRYL) capsule 50 mg  50 mg Oral Q6H PRN Azaela Caracci, [name], MD   50 mg at 05/04/22 2152   Or   diphenhydrAMINE (BENADRYL) injection 50 mg  50 mg Intramuscular Q6H PRN Roy Snuffer T, MD       gabapentin (NEURONTIN) capsule 300 mg  300 mg Oral TID Qunicy Higinbotham T, MD   300 mg at 05/05/22 1230   haloperidol (HALDOL) tablet 5 mg  5 mg Oral TID Kooper Chriswell T, MD   5 mg at 05/05/22 1230   haloperidol (HALDOL) tablet 5 mg  5 mg Oral Q6H PRN Rocio Wolak T, MD   5 mg at 05/03/22 1813   Or   haloperidol lactate (HALDOL) injection 5 mg  5 mg Intramuscular Q6H PRN Kani Jobson T, MD       magnesium hydroxide (MILK OF MAGNESIA) suspension 30 mL  30 mL Oral Daily PRN Christyl Osentoski T, MD   30 mL at 05/02/22 8546   metoprolol succinate  (TOPROL-XL) 24 hr tablet 25 mg  25 mg Oral Daily Council Munguia T, MD   25 mg at 05/05/22 0850   paliperidone (INVEGA SUSTENNA) injection 156 mg  156 mg Intramuscular Q28 days Vannah Nadal T, MD   156 mg at 04/26/22 1000   temazepam (RESTORIL) capsule 15 mg  15 mg Oral QHS Deauna Yaw T, MD   15 mg at 05/04/22 2152   ziprasidone (GEODON) injection 20 mg  20 mg Intramuscular Q12H PRN Vale Mousseau, [name], MD   20 mg at 05/03/22 0103    Lab Results: No results found for this or any previous visit (from the past 48 hour(s)).  Blood Alcohol level:  Lab Results  Component Value Date   ETH <10 07/20/2021    Metabolic Disorder Labs: Lab Results  Component Value Date   HGBA1C 5.5 04/10/2022   MPG 111.15 04/10/2022   No results found for: PROLACTIN Lab Results  Component Value Date   CHOL 167 11/20/2021   TRIG 107 11/20/2021   HDL 26 (L) 11/20/2021   CHOLHDL 6.4 11/20/2021   VLDL 21 11/20/2021   LDLCALC 120 (H) 11/20/2021    Physical Findings: AIMS:  , ,  ,  ,    CIWA:    COWS:     Musculoskeletal: Strength & Muscle Tone: within normal limits Gait & Station: normal Patient leans: N/A  Psychiatric Specialty Exam:  Presentation  General Appearance: Disheveled  Eye Contact:Fleeting  Speech:Garbled  Speech Volume:Decreased  Handedness:Right   Mood and Affect  Mood:Anxious  Affect:Constricted   Thought Process  Thought Processes:Irrevelant  Descriptions of Associations:Loose  Orientation:None  Thought Content:Illogical  History of Schizophrenia/Schizoaffective disorder:Yes  Duration of Psychotic Symptoms:Greater than six months  Hallucinations:No data recorded Ideas of Reference:Paranoia  Suicidal Thoughts:No data recorded Homicidal Thoughts:No data recorded  Sensorium  Memory:Immediate Poor; Remote Poor  Judgment:Impaired  Insight:Lacking   Executive Functions  Concentration:Poor  Attention Span:Poor  Recall:Poor  Fund of  Knowledge:Poor  Language:Poor   Psychomotor Activity  Psychomotor Activity:No data recorded  Assets  Assets:Housing; Resilience; Social Support; Talents/Skills   Sleep  Sleep:No data recorded   Physical Exam: Physical Exam Vitals and nursing note reviewed.  Constitutional:      Appearance: Normal appearance.  HENT:     Head: Normocephalic and atraumatic.     Mouth/Throat:     Pharynx: Oropharynx is clear.  Eyes:     Pupils: Pupils are equal, round, and reactive to  light.  Cardiovascular:     Rate and Rhythm: Normal rate and regular rhythm.  Pulmonary:     Effort: Pulmonary effort is normal.     Breath sounds: Normal breath sounds.  Abdominal:     General: Abdomen is flat.     Palpations: Abdomen is soft.  Musculoskeletal:        General: Normal range of motion.  Skin:    General: Skin is warm and dry.  Neurological:     General: No focal deficit present.     Mental Status: He is alert. Mental status is at baseline.  Psychiatric:        Attention and Perception: He is inattentive.        Mood and Affect: Affect is labile and inappropriate.        Speech: He is noncommunicative.        Thought Content: Thought content normal.   Review of Systems  Unable to perform ROS: Language  Blood pressure 100/82, pulse 100, temperature 97.9 F (36.6 C), temperature source Oral, resp. rate 17, height 5\' 8"  (1.727 m), weight 85.8 kg, SpO2 98 %. Body mass index is 28.76 kg/m.   Treatment Plan Summary: Medication management and Plan no change to medication management.  Supportive counseling to him and encouragement.  Case reviewed daily with the treatment team staff  , MD 05/05/2022, 12:45 PM

## 2022-05-05 NOTE — Progress Notes (Signed)
Pt Tolerated supper and fluids well. Compliant with evening medications. 1:1 observation maintained for safety as ordered without incident. Assigned staff in attendance at all times.

## 2022-05-05 NOTE — Progress Notes (Signed)
Patient continues to be on 1:1 no distress noted he is interacting appropriately with peers and staff, no distress noted, he was complaint with medication this shift. 15 minutes safety checks maintained will closely monitor.  

## 2022-05-05 NOTE — Progress Notes (Signed)
Pt noted with increased irritability, labile mood, oppositional and uncooperative with care on initial interactions. Took his medications with increased verbal encouragement. Received PRN Klonopin 1 mg PO at 0852 for agitation. Pt calmer and more interactive when reassessed at 0945.  1:1 observation maintained for safety with assigned staff in attendance. Support, encouragement and reassurance provided to pt this shift. Verbal education provided on all medications and effects monitored. Pt tolerates all meals, medications and fluids well. Continues to need frequent verbal redirections.

## 2022-05-06 NOTE — Progress Notes (Signed)
Patient pleasant and cooperative on approach. Patient shows appropriate behaviors with peers in the milieu. Compliant with medications and meals. Safety maintained with 1:1 sitter.

## 2022-05-06 NOTE — Progress Notes (Signed)
Patient keeps singing loud in the back hall but no irritability noted. Patient compliant with medications and meals. Safety maintained with 1:1 sitter.

## 2022-05-06 NOTE — Progress Notes (Signed)
[facility] MD Progress Note  05/06/2022 11:25 AM Adam [name]  MRN:  [id number] Subjective: Follow-up for 61 year old man with brain injury and psychosis.  No new complaints.  No change in behavior. 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 No pertinent surgical history. Family History: History reviewed. No pertinent family history. Family Psychiatric  History: See previous Social History:  Social History   Substance and Sexual Activity  Alcohol Use Not Currently     Social History   Substance and Sexual Activity  Drug Use Not Currently    Social History   Socioeconomic History   Marital status: Single    Spouse name: Not on file   Number of children: Not on file   Years of education: Not on file   Highest education level: Not on file  Occupational History   Not on file  Tobacco Use   Smoking status: Never    Passive exposure: Never   Smokeless tobacco: Never  Vaping Use   Vaping Use: Unknown  Substance and Sexual Activity   Alcohol use: Not Currently   Drug use: Not Currently   Sexual activity: Not Currently  Other Topics Concern   Not on file  Social History Narrative   Not on file   Social Determinants of Health   Financial Resource Strain: Not on file  Food Insecurity: Not on file  Transportation Needs: Not on  file  Physical Activity: Not on file  Stress: Not on file  Social Connections: Not on file   Additional Social History:                         Sleep: Fair  Appetite:  Fair  Current Medications: Current Facility-Administered Medications  Medication Dose Route Frequency Provider Last Rate Last Admin   acetaminophen (TYLENOL) tablet 650 mg  650 mg Oral Q6H PRN Densil Ottey T, MD   650 mg at 04/26/22 0637   alum & mag hydroxide-simeth (MAALOX/MYLANTA) 200-200-20 MG/5ML suspension 30 mL  30 mL Oral Q4H PRN Duvall Comes T, MD   30 mL at 04/26/22 6301   aspirin EC tablet 81 mg  81 mg Oral Daily Darnella Zeiter, [name], MD   81 mg at 05/06/22 6010   atorvastatin (LIPITOR) tablet 20 mg  20 mg Oral Daily Lathon Adan T, MD   20 mg at 05/06/22 0833   atropine 1 % ophthalmic solution 1 drop  1 drop Sublingual QID [name], MD   1 drop at 05/06/22 0833   benztropine (COGENTIN) tablet 0.5 mg  0.5 mg Oral BID Jarod Bozzo T, MD   0.5 mg at 05/06/22 9323   clonazePAM (KLONOPIN) tablet 1 mg  1 mg Oral TID PRN He, [name], MD   1 mg at 05/05/22 5573   cloZAPine (CLOZARIL) tablet 300 mg  300 mg Oral QHS Amalee Olsen, [name], MD  300 mg at 05/05/22 2109   diphenhydrAMINE (BENADRYL) capsule 50 mg  50 mg Oral Q6H PRN Kenyetta Wimbish, [name], MD   50 mg at 05/04/22 2152   Or   diphenhydrAMINE (BENADRYL) injection 50 mg  50 mg Intramuscular Q6H PRN Priyah Schmuck T, MD       gabapentin (NEURONTIN) capsule 300 mg  300 mg Oral TID Ayme Short, [name], MD   300 mg at 05/06/22 0981   haloperidol (HALDOL) tablet 5 mg  5 mg Oral TID Dolorez Jeffrey, [name], MD   5 mg at 05/06/22 1914   haloperidol (HALDOL) tablet 5 mg  5 mg Oral Q6H PRN Norrin Shreffler T, MD   5 mg at 05/03/22 1813   Or   haloperidol lactate (HALDOL) injection 5 mg  5 mg Intramuscular Q6H PRN Zowie Lundahl T, MD       magnesium hydroxide (MILK OF MAGNESIA) suspension 30 mL  30 mL Oral Daily PRN Andjela Wickes T, MD   30 mL at 05/02/22 7829   metoprolol succinate  (TOPROL-XL) 24 hr tablet 25 mg  25 mg Oral Daily Hyde Sires T, MD   25 mg at 05/06/22 5621   paliperidone (INVEGA SUSTENNA) injection 156 mg  156 mg Intramuscular Q28 days Kathreen Dileo T, MD   156 mg at 04/26/22 1000   temazepam (RESTORIL) capsule 15 mg  15 mg Oral QHS Jovonni Borquez T, MD   15 mg at 05/05/22 2109   ziprasidone (GEODON) injection 20 mg  20 mg Intramuscular Q12H PRN Markie Frith, [name], MD   20 mg at 05/03/22 0103    Lab Results: No results found for this or any previous visit (from the past 48 hour(s)).  Blood Alcohol level:  Lab Results  Component Value Date   ETH <10 07/20/2021    Metabolic Disorder Labs: Lab Results  Component Value Date   HGBA1C 5.5 04/10/2022   MPG 111.15 04/10/2022   No results found for: PROLACTIN Lab Results  Component Value Date   CHOL 167 11/20/2021   TRIG 107 11/20/2021   HDL 26 (L) 11/20/2021   CHOLHDL 6.4 11/20/2021   VLDL 21 11/20/2021   LDLCALC 120 (H) 11/20/2021    Physical Findings: AIMS: Facial and Oral Movements Muscles of Facial Expression: None, normal Lips and Perioral Area: None, normal Jaw: None, normal Tongue: None, normal,Extremity Movements Upper (arms, wrists, hands, fingers): None, normal Lower (legs, knees, ankles, toes): None, normal, Trunk Movements Neck, shoulders, hips: None, normal, Overall Severity Severity of abnormal movements (highest score from questions above): None, normal Incapacitation due to abnormal movements: None, normal Patient's awareness of abnormal movements (rate only patient's report): No Awareness, Dental Status Current problems with teeth and/or dentures?: No Does patient usually wear dentures?: No  CIWA:    COWS:     Musculoskeletal: Strength & Muscle Tone: within normal limits Gait & Station: normal Patient leans: N/A  Psychiatric Specialty Exam:  Presentation  General Appearance: Disheveled  Eye Contact:Fleeting  Speech:Garbled  Speech  Volume:Decreased  Handedness:Right   Mood and Affect  Mood:Anxious  Affect:Constricted   Thought Process  Thought Processes:Irrevelant  Descriptions of Associations:Loose  Orientation:None  Thought Content:Illogical  History of Schizophrenia/Schizoaffective disorder:Yes  Duration of Psychotic Symptoms:Greater than six months  Hallucinations:No data recorded Ideas of Reference:Paranoia  Suicidal Thoughts:No data recorded Homicidal Thoughts:No data recorded  Sensorium  Memory:Immediate Poor; Remote Poor  Judgment:Impaired  Insight:Lacking   Executive Functions  Concentration:Poor  Attention Span:Poor  Recall:Poor  Fund of Knowledge:Poor  Language:Poor  Psychomotor Activity  Psychomotor Activity:No data recorded  Assets  Assets:Housing; Resilience; Social Support; Talents/Skills   Sleep  Sleep:No data recorded   Physical Exam: Physical Exam Vitals and nursing note reviewed.  Constitutional:      Appearance: Normal appearance.  HENT:     Head: Normocephalic and atraumatic.     Mouth/Throat:     Pharynx: Oropharynx is clear.  Eyes:     Pupils: Pupils are equal, round, and reactive to light.  Cardiovascular:     Rate and Rhythm: Normal rate and regular rhythm.  Pulmonary:     Effort: Pulmonary effort is normal.     Breath sounds: Normal breath sounds.  Abdominal:     General: Abdomen is flat.     Palpations: Abdomen is soft.  Musculoskeletal:        General: Normal range of motion.  Skin:    General: Skin is warm and dry.  Neurological:     General: No focal deficit present.     Mental Status: He is alert. Mental status is at baseline.  Psychiatric:        Attention and Perception: He is inattentive.        Mood and Affect: Mood normal. Affect is labile.        Speech: He is noncommunicative.        Behavior: Behavior is cooperative.   Review of Systems  Unable to perform ROS: Language  Blood pressure 100/82, pulse 98,  temperature 97.9 F (36.6 C), temperature source Oral, resp. rate 17, height 5\' 8"  (1.727 m), weight 85.8 kg, SpO2 98 %. Body mass index is 28.76 kg/m.   Treatment Plan Summary: Medication management and Plan no change to medication management.  Supportive encouraging contact as usual with the patient.  Tomorrow I believe would be 1 week since his first COVID-vaccine so I suppose will need another week before the second 1.  Mordecai Rasmussen, MD 05/06/2022, 11:25 AM

## 2022-05-06 NOTE — Progress Notes (Signed)
Patient continues to be on 1:1 no distress noted he is interacting appropriately with peers and staff, no distress noted, he was complaint with medication this shift. 15 minutes safety checks maintained will closely monitor.

## 2022-05-07 LAB — CBC WITH DIFFERENTIAL/PLATELET
Abs Immature Granulocytes: 0.01 10*3/uL (ref 0.00–0.07)
Basophils Absolute: 0 10*3/uL (ref 0.0–0.1)
Basophils Relative: 1 %
Eosinophils Absolute: 0 10*3/uL (ref 0.0–0.5)
Eosinophils Relative: 0 %
HCT: 40.7 % (ref 39.0–52.0)
Hemoglobin: 13.7 g/dL (ref 13.0–17.0)
Immature Granulocytes: 0 %
Lymphocytes Relative: 44 %
Lymphs Abs: 1.3 10*3/uL (ref 0.7–4.0)
MCH: 29.3 pg (ref 26.0–34.0)
MCHC: 33.7 g/dL (ref 30.0–36.0)
MCV: 87.2 fL (ref 80.0–100.0)
Monocytes Absolute: 0.4 10*3/uL (ref 0.1–1.0)
Monocytes Relative: 13 %
Neutro Abs: 1.3 10*3/uL — ABNORMAL LOW (ref 1.7–7.7)
Neutrophils Relative %: 42 %
Platelets: 185 10*3/uL (ref 150–400)
RBC: 4.67 MIL/uL (ref 4.22–5.81)
RDW: 13.1 % (ref 11.5–15.5)
WBC: 3.1 10*3/uL — ABNORMAL LOW (ref 4.0–10.5)
nRBC: 0 % (ref 0.0–0.2)

## 2022-05-07 NOTE — Progress Notes (Signed)
Patient on 1:1 while awake with normal rounding per unit protocol. Pt is calm, compliant with medication administration per MD orders. Pt remains safe on the unit.

## 2022-05-07 NOTE — Plan of Care (Signed)
D: Pt alert and oriented. Pt denies experiencing any anxiety/depression at this time. Pt denies experiencing any pain at this time. Pt denies experiencing any SI/HI, or AVH at this time.   A: Scheduled medications administered to pt, per MD orders. Support and encouragement provided. Frequent verbal contact made. Routine safety checks conducted q15 minutes and remains on continuous 1:1 with sitter present.   R: No adverse drug reactions noted. Pt verbally contracts for safety at this time. Pt compliant with medications. Pt interacts well with others on the unit. Pt remains safe at this time. Will continue to monitor.   Problem: Education: Goal: Ability to state activities that reduce stress will improve Outcome: Not Progressing   Problem: Coping: Goal: Ability to identify and develop effective coping behavior will improve Outcome: Not Progressing

## 2022-05-07 NOTE — Progress Notes (Signed)
Pharmacy Consult - Clozapine     61 yo male ordered clozapine 300 mg PO qHS    Clozapine [facility] enrollment Verified: yes Clozapine [facility]S enrollment: 01/04/22  REMS patient ID: [id number]     Dose Adjustments This Admission:   Decrease dose from 375mg  to 300mg  on  4/10     Dose Adjustments This Admission: 2/3 started on clozapine 25mg   2/10 clozapine 100 mg  2/20 clozapine 175 mg 2/22 clozapine 200 mg  3/06 clozapine 250 mg 3/08 clozapine 300 mg 3/17 clozapine 350 mg 4/10 clozapine 300 mg   Plan: clozapine 300mg  qHS as previously ordered by Dr 5/06 ANC 6/5 = 1300, will increase ANC monitoring to 3x/week     **CLOZAPINE MONITORING** (reflects NEW REMS GUIDELINES EFFECTIVE 09/13/2014): Check ANC at least weekly while inpatient.   For general population patients, i.e., those without benign ethnic neutropenia (BEN): --If ANC 1000-1499, increase ANC monitoring to 3x/wk --If ANC < 1000, HOLD CLOZAPINE and get psych consult

## 2022-05-07 NOTE — Progress Notes (Signed)
Patient continues to be on 1:1 no distress noted he is interacting appropriately with peers and staff, no distress noted, he was complaint with medication this shift. 15 minutes safety checks maintained will closely monitor.

## 2022-05-07 NOTE — Progress Notes (Signed)
1:1 Hourly Rounding  0730: Pt in dayroom with assigned sitter present.  0830: Pt in bedroom with assigned sitter present.  0930: Pt in bedroom with assigned sitter present.  1030: Pt in bedroom with assigned sitter present.  1130: Pt in dayroom with assigned sitter present.  1230: Pt in hallway with assigned sitter present.  1330: Pt in dayroom with assigned sitter  present.  1430: Pt in dayroom with MHT present.  1530: Pt in bedroom with assigned sitter present.  1630: Pt in dayroom with assigned sitter present.  1730: Pt in in bedroom with assigned sitter present.  1830: Pt in dayroom with assigned sitter present.

## 2022-05-07 NOTE — Progress Notes (Signed)
Recreation Therapy Notes    Date: 05/07/2022   Time: 11:00 am    Location: Courtyard      Behavioral response: Appropriate   Intervention Topic: Leisure     Discussion/Intervention:  Group content today was focused on leisure. The group defined what leisure is and some positive leisure activities they participate in. Individuals identified the difference between good and bad leisure. Participants expressed how they feel after participating in the leisure of their choice. The group discussed how they go about picking a leisure activity and if others are involved in their leisure activities. The patient stated how many leisure activities they have to choose from and reasons why it is important to have leisure time. Individuals participated in the intervention "Exploration of Leisure" where they had a chance to identify new leisure activities as well as benefits of leisure. Clinical Observations/Feedback: Patient came to group late and was able to explore participate in many leisure activities. Participant was able to identify leisure activities they enjoy outside of the hospital. Individual was social with peers and staff while participating in the intervention. Patient left group early and did not return.  Saketh Daubert LRT/CTRS             Willa Brocks 05/07/2022 12:00 PM

## 2022-05-07 NOTE — Progress Notes (Signed)
[facility] MD Progress Note  05/07/2022 11:26 AM Adam [name]  MRN:  [id number] Subjective: Patient seen and chart reviewed.  Patient had no complaints.  Made good eye contact smiled appears to be comfortable today.  Updated him on the current treatment plan.  Labs reviewed.  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 No pertinent surgical history. Family History: History reviewed. No pertinent family history. Family Psychiatric  History: See previous Social History:  Social History   Substance and Sexual Activity  Alcohol Use Not Currently     Social History   Substance and Sexual Activity  Drug Use Not Currently    Social History   Socioeconomic History   Marital status: Single    Spouse name: Not on file   Number of children: Not on file   Years of education: Not on file   Highest education level: Not on file  Occupational History   Not on file  Tobacco Use   Smoking status: Never    Passive exposure: Never   Smokeless tobacco: Never  Vaping Use   Vaping Use: Unknown  Substance and Sexual Activity   Alcohol use: Not Currently   Drug use: Not Currently   Sexual activity: Not Currently  Other Topics  Concern   Not on file  Social History Narrative   Not on file   Social Determinants of Health   Financial Resource Strain: Not on file  Food Insecurity: Not on file  Transportation Needs: Not on file  Physical Activity: Not on file  Stress: Not on file  Social Connections: Not on file   Additional Social History:                         Sleep: Fair  Appetite:  Fair  Current Medications: Current Facility-Administered Medications  Medication Dose Route Frequency Provider Last Rate Last Admin   acetaminophen (TYLENOL) tablet 650 mg  650 mg Oral Q6H PRN Carley Glendenning T, MD   650 mg at 04/26/22 0637   alum & mag hydroxide-simeth (MAALOX/MYLANTA) 200-200-20 MG/5ML suspension 30 mL  30 mL Oral Q4H PRN Bjorn Hallas T, MD   30 mL at 04/26/22 7510   aspirin EC tablet 81 mg  81 mg Oral Daily Branston Halsted T, MD   81 mg at 05/07/22 0740   atorvastatin (LIPITOR) tablet 20 mg  20 mg Oral Daily Tobin Witucki T, MD   20 mg at 05/07/22 0739   atropine 1 % ophthalmic solution 1 drop  1 drop Sublingual QID [name], MD   1 drop at 05/07/22 0739   benztropine (COGENTIN) tablet 0.5 mg  0.5 mg Oral BID Kalanie Fewell, [name], MD   0.5 mg at 05/07/22 [phone]  clonazePAM (KLONOPIN) tablet 1 mg  1 mg Oral TID PRN He, [name], MD   1 mg at 05/06/22 2113   cloZAPine (CLOZARIL) tablet 300 mg  300 mg Oral QHS Pattricia Weiher T, MD   300 mg at 05/06/22 2113   diphenhydrAMINE (BENADRYL) capsule 50 mg  50 mg Oral Q6H PRN Joash [name], [name], MD   50 mg at 05/06/22 2112   Or   diphenhydrAMINE (BENADRYL) injection 50 mg  50 mg Intramuscular Q6H PRN Valora Norell, [name], MD       gabapentin (NEURONTIN) capsule 300 mg  300 mg Oral TID Nickholas Goldston, [name], MD   300 mg at 05/07/22 0740   haloperidol (HALDOL) tablet 5 mg  5 mg Oral TID Wanda Rideout, [name], MD   5 mg at 05/07/22 0739   haloperidol (HALDOL) tablet 5 mg  5 mg Oral Q6H PRN Ophia Shamoon, [name], MD   5 mg at 05/03/22 1813   Or   haloperidol lactate (HALDOL) injection 5 mg  5  mg Intramuscular Q6H PRN Jaanvi Fizer, [name], MD       magnesium hydroxide (MILK OF MAGNESIA) suspension 30 mL  30 mL Oral Daily PRN Glennie Bose T, MD   30 mL at 05/02/22 4315   metoprolol succinate (TOPROL-XL) 24 hr tablet 25 mg  25 mg Oral Daily Brylei Pedley T, MD   25 mg at 05/07/22 0744   paliperidone (INVEGA SUSTENNA) injection 156 mg  156 mg Intramuscular Q28 days Joleah Kosak T, MD   156 mg at 04/26/22 1000   temazepam (RESTORIL) capsule 15 mg  15 mg Oral QHS Anokhi Shannon T, MD   15 mg at 05/06/22 2112   ziprasidone (GEODON) injection 20 mg  20 mg Intramuscular Q12H PRN Silvana Holecek, [name], MD   20 mg at 05/03/22 0103    Lab Results:  Results for orders placed or performed during the hospital encounter of 03/19/22 (from the past 48 hour(s))  CBC with Differential/Platelet     Status: Abnormal   Collection Time: 05/07/22  9:11 AM  Result Value Ref Range   WBC 3.1 (L) 4.0 - 10.5 K/uL   RBC 4.67 4.22 - 5.81 MIL/uL   Hemoglobin 13.7 13.0 - 17.0 g/dL   HCT 40.0 86.7 - 61.9 %   MCV 87.2 80.0 - 100.0 fL   MCH 29.3 26.0 - 34.0 pg   MCHC 33.7 30.0 - 36.0 g/dL   RDW 50.9 32.6 - 71.2 %   Platelets 185 150 - 400 K/uL   nRBC 0.0 0.0 - 0.2 %   Neutrophils Relative % 42 %   Neutro Abs 1.3 (L) 1.7 - 7.7 K/uL   Lymphocytes Relative 44 %   Lymphs Abs 1.3 0.7 - 4.0 K/uL   Monocytes Relative 13 %   Monocytes Absolute 0.4 0.1 - 1.0 K/uL   Eosinophils Relative 0 %   Eosinophils Absolute 0.0 0.0 - 0.5 K/uL   Basophils Relative 1 %   Basophils Absolute 0.0 0.0 - 0.1 K/uL   Immature Granulocytes 0 %   Abs Immature Granulocytes 0.01 0.00 - 0.07 K/uL    Comment: Performed at [facility], [address] [place], [place] [address]    Blood Alcohol level:  Lab Results  Component Value Date   [facility] <10 07/20/2021    Metabolic Disorder Labs: Lab Results  Component Value Date   HGBA1C 5.5 04/10/2022   MPG 111.15 04/10/2022   No results found for: PROLACTIN Lab Results  Component  Value Date   CHOL 167 11/20/2021   TRIG 107 11/20/2021   HDL 26 (L) 11/20/2021   CHOLHDL 6.4 11/20/2021   VLDL 21 11/20/2021   LDLCALC 120 (H) 11/20/2021    Physical Findings: AIMS: Facial and Oral Movements Muscles of Facial Expression: None, normal Lips and Perioral Area: None, normal Jaw: None, normal Tongue: None, normal,Extremity Movements Upper (arms, wrists, hands, fingers): None, normal Lower (legs, knees, ankles, toes): None, normal, Trunk Movements Neck, shoulders, hips: None, normal, Overall Severity Severity of abnormal movements (highest score from questions above): None, normal Incapacitation due to abnormal movements: None, normal Patient's awareness of abnormal movements (rate only patient's report): No Awareness, Dental Status Current problems with teeth and/or dentures?: No Does patient usually wear dentures?: No  CIWA:    COWS:     Musculoskeletal: Strength & Muscle Tone: within normal limits Gait & Station: normal Patient leans: N/A  Psychiatric Specialty Exam:  Presentation  General Appearance: Disheveled  Eye Contact:Fleeting  Speech:Garbled  Speech Volume:Decreased  Handedness:Right   Mood and Affect  Mood:Anxious  Affect:Constricted   Thought Process  Thought Processes:Irrevelant  Descriptions of Associations:Loose  Orientation:None  Thought Content:Illogical  History of Schizophrenia/Schizoaffective disorder:Yes  Duration of Psychotic Symptoms:Greater than six months  Hallucinations:No data recorded Ideas of Reference:Paranoia  Suicidal Thoughts:No data recorded Homicidal Thoughts:No data recorded  Sensorium  Memory:Immediate Poor; Remote Poor  Judgment:Impaired  Insight:Lacking   Executive Functions  Concentration:Poor  Attention Span:Poor  Recall:Poor  Fund of Knowledge:Poor  Language:Poor   Psychomotor Activity  Psychomotor Activity:No data recorded  Assets  Assets:Housing; Resilience; Social  Support; Talents/Skills   Sleep  Sleep:No data recorded   Physical Exam: Physical Exam Vitals and nursing note reviewed.  Constitutional:      Appearance: Normal appearance.  HENT:     Head: Normocephalic and atraumatic.     Mouth/Throat:     Pharynx: Oropharynx is clear.  Eyes:     Pupils: Pupils are equal, round, and reactive to light.  Cardiovascular:     Rate and Rhythm: Normal rate and regular rhythm.  Pulmonary:     Effort: Pulmonary effort is normal.     Breath sounds: Normal breath sounds.  Abdominal:     General: Abdomen is flat.     Palpations: Abdomen is soft.  Musculoskeletal:        General: Normal range of motion.  Skin:    General: Skin is warm and dry.  Neurological:     General: No focal deficit present.     Mental Status: He is alert. Mental status is at baseline.  Psychiatric:        Attention and Perception: Attention normal.        Mood and Affect: Mood normal.        Speech: He is noncommunicative.   Review of Systems  Unable to perform ROS: Language  Blood pressure 126/85, pulse 83, temperature (!) 97.5 F (36.4 C), temperature source Oral, resp. rate 16, height 5\' 8"  (1.727 m), weight 85.8 kg, SpO2 99 %. Body mass index is 28.76 kg/m.   Treatment Plan Summary: Medication management and Plan no change to medication or treatment plan.  Next COVID vaccination in a week.  We continue to work with the rest of the hospital on overall disposition plan both in and out of the hospital  Mordecai Rasmussen, MD 05/07/2022, 11:26 AM

## 2022-05-08 NOTE — Plan of Care (Signed)
D- Patient alert and oriented to person and situation. Patient presented in a pleasant mood on assessment stating that he slept ok last night and had no complaints to voice to this Clinical research associate. 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 Patient denied SI, HI, AVH, and pain to this Clinical research associate. Patient also denied any signs/symptoms of depression and anxiety. Patient had no stated goals for today.  A- Some scheduled medications administered to patient, per MD orders. Support and encouragement provided. Routine safety checks conducted every 15 minutes. Patient informed to notify staff with problems or concerns.  R- No adverse drug reactions noted. Patient contracts for safety at this time. Patient compliant with most medications and. Patient receptive, calm, and cooperative. Patient interacts well with others on the unit.  Patient remains safe at this time.  Problem: Safety: Goal: Violent Restraint(s) Outcome: Not Progressing   Problem: Education: Goal: Ability to state activities that reduce stress will improve Outcome: Not Progressing   Problem: Coping: Goal: Ability to identify and develop effective coping behavior will improve Outcome: Not Progressing   Problem: Self-Concept: Goal: Ability to identify factors that promote anxiety will improve Outcome: Not Progressing Goal: Level of anxiety will decrease Outcome: Not Progressing Goal: Ability to modify response to factors that promote anxiety will improve Outcome: Not Progressing

## 2022-05-08 NOTE — Progress Notes (Signed)
Recreation Therapy Notes    Date: 05/07/2022   Time: 10:15 am   Location: Craft room    Behavioral response: N/A   Intervention Topic: Values    Discussion/Intervention: Patient refused to attend group.    Clinical Observations/Feedback:  Patient refused to attend group.    Heena Woodbury LRT/CTRS          Esvin Hnat 05/08/2022 10:26 AM 

## 2022-05-08 NOTE — Progress Notes (Signed)
Patient took the Haldol out of his medicine cup and refused to take it. He did take all of his other scheduled medication. MD will notified.

## 2022-05-08 NOTE — Progress Notes (Signed)
1:1 Patient Hourly Rounding  0800: Patient is in bed, asleep. Patient only requires a sitter while awake.  0900: Patient is sitting in the dayroom, on the back hall, receiving his morning medication. This Clinical research associate is his assigned Recruitment consultant.  1000: Patient is in the main dayroom, watching television, with other members on the unit. Patient's assigned safety sitter is present.  1100: Patient is in the medication room, receiving medication from this writer. Patient continues to refuse Haldol pill. This Clinical research associate is patient's assigned Recruitment consultant.  1200: Patient is in the dayroom, on the back hall, with this Clinical research associate as his assigned Recruitment consultant. Patient brought his earring for this writer to put it back into his earlobe.  1300: Patient is in the dayroom, on the back hall, with this Clinical research associate as his assigned Recruitment consultant.  1400: Patient is in the dayroom, on the back hall, with his assigned safety sitter present.   1500: Patient is in the dayroom, on the back hall, with his assigned safety sitter present.  1600: Patient is in the dayroom, on the back hall, with his assigned Recruitment consultant.  1700: Patient is in the dayroom, on the back hall, with his assigned safety sitter present.  1800: Patient is in his room, calm and collected, with his assigned safety sitter present.  1900: Patient is in his room, awake, with his assigned safety sitter present.

## 2022-05-08 NOTE — Progress Notes (Signed)
Patient is continuing to refuse his scheduled Haldol. MD will be notified.

## 2022-05-08 NOTE — Progress Notes (Signed)
Patient did take his scheduled afternoon Haldol from this Clinical research associate. Patient tolerated med administration well, without any issues. Patient remains safe on the unit.

## 2022-05-08 NOTE — Progress Notes (Signed)
[facility] MD Progress Note  05/08/2022 2:05 PM Adam [name]  MRN:  [id number] Subjective: Follow-up 61 year old man with schizophrenia and brain injury.  No new complaints.  Behavior is about the same.  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 No pertinent surgical history. Family History: History reviewed. No pertinent family history. Family Psychiatric  History: See previous Social History:  Social History   Substance and Sexual Activity  Alcohol Use Not Currently     Social History   Substance and Sexual Activity  Drug Use Not Currently    Social History   Socioeconomic History   Marital status: Single    Spouse name: Not on file   Number of children: Not on file   Years of education: Not on file   Highest education level: Not on file  Occupational History   Not on file  Tobacco Use   Smoking status: Never    Passive exposure: Never   Smokeless tobacco: Never  Vaping Use   Vaping Use: Unknown  Substance and Sexual Activity   Alcohol use: Not Currently   Drug use: Not Currently   Sexual activity: Not Currently  Other Topics Concern   Not on file  Social History Narrative   Not on file   Social Determinants of Health   Financial Resource Strain: Not on file  Food Insecurity: Not on file   Transportation Needs: Not on file  Physical Activity: Not on file  Stress: Not on file  Social Connections: Not on file   Additional Social History:                         Sleep: Fair  Appetite:  Fair  Current Medications: Current Facility-Administered Medications  Medication Dose Route Frequency Provider Last Rate Last Admin   acetaminophen (TYLENOL) tablet 650 mg  650 mg Oral Q6H PRN Charlotta Lapaglia T, MD   650 mg at 04/26/22 0637   alum & mag hydroxide-simeth (MAALOX/MYLANTA) 200-200-20 MG/5ML suspension 30 mL  30 mL Oral Q4H PRN Kiasia Chou T, MD   30 mL at 04/26/22 9407   aspirin EC tablet 81 mg  81 mg Oral Daily Linder Prajapati, [name], MD   81 mg at 05/08/22 0857   atorvastatin (LIPITOR) tablet 20 mg  20 mg Oral Daily Cherylee Rawlinson T, MD   20 mg at 05/08/22 0857   atropine 1 % ophthalmic solution 1 drop  1 drop Sublingual QID [name], MD   1 drop at 05/08/22 1102   benztropine (COGENTIN) tablet 0.5 mg  0.5 mg Oral BID Patrisha Hausmann T, MD   0.5 mg at 05/08/22 0857   clonazePAM (KLONOPIN) tablet 1 mg  1 mg Oral TID PRN He, [name], MD   1 mg at 05/06/22 2113   cloZAPine (CLOZARIL) tablet 300 mg  300 mg Oral QHS Jamis Kryder T,  MD   300 mg at 05/07/22 2113   diphenhydrAMINE (BENADRYL) capsule 50 mg  50 mg Oral Q6H PRN [name], [name], MD   50 mg at 05/06/22 2112   Or   diphenhydrAMINE (BENADRYL) injection 50 mg  50 mg Intramuscular Q6H PRN Maribel Luis, [name], MD       gabapentin (NEURONTIN) capsule 300 mg  300 mg Oral TID Angelynn Lemus, [name], MD   300 mg at 05/08/22 1102   haloperidol (HALDOL) tablet 5 mg  5 mg Oral TID Sachit Gilman, [name], MD   5 mg at 05/08/22 1305   haloperidol (HALDOL) tablet 5 mg  5 mg Oral Q6H PRN Zoi Devine, [name], MD   5 mg at 05/03/22 1813   Or   haloperidol lactate (HALDOL) injection 5 mg  5 mg Intramuscular Q6H PRN Itzy Adler, [name], MD       magnesium hydroxide (MILK OF MAGNESIA) suspension 30 mL  30 mL Oral Daily PRN Jaimere Feutz T, MD   30 mL at 05/02/22  7858   metoprolol succinate (TOPROL-XL) 24 hr tablet 25 mg  25 mg Oral Daily Sholanda Croson T, MD   25 mg at 05/08/22 0857   paliperidone (INVEGA SUSTENNA) injection 156 mg  156 mg Intramuscular Q28 days Briahna Pescador T, MD   156 mg at 04/26/22 1000   temazepam (RESTORIL) capsule 15 mg  15 mg Oral QHS Xhaiden Coombs T, MD   15 mg at 05/07/22 2113   ziprasidone (GEODON) injection 20 mg  20 mg Intramuscular Q12H PRN Zayah Keilman, [name], MD   20 mg at 05/03/22 0103    Lab Results:  Results for orders placed or performed during the hospital encounter of 03/19/22 (from the past 48 hour(s))  CBC with Differential/Platelet     Status: Abnormal   Collection Time: 05/07/22  9:11 AM  Result Value Ref Range   WBC 3.1 (L) 4.0 - 10.5 K/uL   RBC 4.67 4.22 - 5.81 MIL/uL   Hemoglobin 13.7 13.0 - 17.0 g/dL   HCT 85.0 27.7 - 41.2 %   MCV 87.2 80.0 - 100.0 fL   MCH 29.3 26.0 - 34.0 pg   MCHC 33.7 30.0 - 36.0 g/dL   RDW 87.8 67.6 - 72.0 %   Platelets 185 150 - 400 K/uL   nRBC 0.0 0.0 - 0.2 %   Neutrophils Relative % 42 %   Neutro Abs 1.3 (L) 1.7 - 7.7 K/uL   Lymphocytes Relative 44 %   Lymphs Abs 1.3 0.7 - 4.0 K/uL   Monocytes Relative 13 %   Monocytes Absolute 0.4 0.1 - 1.0 K/uL   Eosinophils Relative 0 %   Eosinophils Absolute 0.0 0.0 - 0.5 K/uL   Basophils Relative 1 %   Basophils Absolute 0.0 0.0 - 0.1 K/uL   Immature Granulocytes 0 %   Abs Immature Granulocytes 0.01 0.00 - 0.07 K/uL    Comment: Performed at [facility], [address] [place], [place] [address]    Blood Alcohol level:  Lab Results  Component Value Date   [facility] <10 07/20/2021    Metabolic Disorder Labs: Lab Results  Component Value Date   HGBA1C 5.5 04/10/2022   MPG 111.15 04/10/2022   No results found for: PROLACTIN Lab Results  Component Value Date   CHOL 167 11/20/2021   TRIG 107 11/20/2021   HDL 26 (L) 11/20/2021   CHOLHDL 6.4 11/20/2021   VLDL 21 11/20/2021   LDLCALC 120 (H) 11/20/2021  Physical  Findings: AIMS: Facial and Oral Movements Muscles of Facial Expression: None, normal Lips and Perioral Area: None, normal Jaw: None, normal Tongue: None, normal,Extremity Movements Upper (arms, wrists, hands, fingers): None, normal Lower (legs, knees, ankles, toes): None, normal, Trunk Movements Neck, shoulders, hips: None, normal, Overall Severity Severity of abnormal movements (highest score from questions above): None, normal Incapacitation due to abnormal movements: None, normal Patient's awareness of abnormal movements (rate only patient's report): No Awareness, Dental Status Current problems with teeth and/or dentures?: No Does patient usually wear dentures?: No  CIWA:    COWS:     Musculoskeletal: Strength & Muscle Tone: within normal limits Gait & Station: normal Patient leans: N/A  Psychiatric Specialty Exam:  Presentation  General Appearance: Disheveled  Eye Contact:Fleeting  Speech:Garbled  Speech Volume:Decreased  Handedness:Right   Mood and Affect  Mood:Anxious  Affect:Constricted   Thought Process  Thought Processes:Irrevelant  Descriptions of Associations:Loose  Orientation:None  Thought Content:Illogical  History of Schizophrenia/Schizoaffective disorder:Yes  Duration of Psychotic Symptoms:Greater than six months  Hallucinations:No data recorded Ideas of Reference:Paranoia  Suicidal Thoughts:No data recorded Homicidal Thoughts:No data recorded  Sensorium  Memory:Immediate Poor; Remote Poor  Judgment:Impaired  Insight:Lacking   Executive Functions  Concentration:Poor  Attention Span:Poor  Recall:Poor  Fund of Knowledge:Poor  Language:Poor   Psychomotor Activity  Psychomotor Activity:No data recorded  Assets  Assets:Housing; Resilience; Social Support; Talents/Skills   Sleep  Sleep:No data recorded   Physical Exam: Physical Exam Vitals and nursing note reviewed.  Constitutional:      Appearance: Normal  appearance.  HENT:     Head: Normocephalic and atraumatic.     Mouth/Throat:     Pharynx: Oropharynx is clear.  Eyes:     Pupils: Pupils are equal, round, and reactive to light.  Cardiovascular:     Rate and Rhythm: Normal rate and regular rhythm.  Pulmonary:     Effort: Pulmonary effort is normal.     Breath sounds: Normal breath sounds.  Abdominal:     General: Abdomen is flat.     Palpations: Abdomen is soft.  Musculoskeletal:        General: Normal range of motion.  Skin:    General: Skin is warm and dry.  Neurological:     General: No focal deficit present.     Mental Status: He is alert. Mental status is at baseline.  Psychiatric:        Attention and Perception: Attention normal.        Mood and Affect: Affect is blunt.        Speech: He is noncommunicative.   Review of Systems  Unable to perform ROS: Language  Blood pressure (!) 150/93, pulse 82, temperature 97.6 F (36.4 C), temperature source Oral, resp. rate 18, height 5\' 8"  (1.727 m), weight 85.8 kg, SpO2 97 %. Body mass index is 28.76 kg/m.   Treatment Plan Summary: Plan no change to medication management.  Encouraging comments to the patient.  Next COVID-vaccine in about a week.  , MD 05/08/2022, 2:05 PM

## 2022-05-09 NOTE — Progress Notes (Signed)
[facility] MD Progress Note  05/09/2022 1:33 PM Adam [name]  MRN:  [id number] Subjective: Follow-up this patient with schizophrenia and stroke.  Patient himself has no complaints.  His behavior and presentation remains stable.  Especially if he has a one-on-one sitter his behavior is fine.  At worst he will have some loud vocalizations from time to time.  His affect appears to be upbeat and pleasant and he has no obvious physical complaints.  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 No pertinent surgical history. Family History: History reviewed. No pertinent family history. Family Psychiatric  History: See previous Social History:  Social History   Substance and Sexual Activity  Alcohol Use Not Currently     Social History   Substance and Sexual Activity  Drug Use Not Currently    Social History   Socioeconomic History   Marital status: Single    Spouse name: Not on file   Number of children: Not on file   Years of education: Not on file   Highest education level: Not on file  Occupational History   Not on file  Tobacco  Use   Smoking status: Never    Passive exposure: Never   Smokeless tobacco: Never  Vaping Use   Vaping Use: Unknown  Substance and Sexual Activity   Alcohol use: Not Currently   Drug use: Not Currently   Sexual activity: Not Currently  Other Topics Concern   Not on file  Social History Narrative   Not on file   Social Determinants of Health   Financial Resource Strain: Not on file  Food Insecurity: Not on file  Transportation Needs: Not on file  Physical Activity: Not on file  Stress: Not on file  Social Connections: Not on file   Additional Social History:                         Sleep: Fair  Appetite:  Fair  Current Medications: Current Facility-Administered Medications  Medication Dose Route Frequency Provider Last Rate Last Admin   acetaminophen (TYLENOL) tablet 650 mg  650 mg Oral Q6H PRN Dorthy Magnussen T, MD   650 mg at 04/26/22 0637   alum & mag hydroxide-simeth (MAALOX/MYLANTA) 200-200-20 MG/5ML suspension 30 mL  30 mL Oral Q4H PRN Jacquan Savas T, MD   30 mL at 04/26/22 6644   aspirin EC tablet 81 mg  81 mg Oral Daily Coretta Leisey, [name], MD   81 mg at 05/09/22 0723   atorvastatin (LIPITOR) tablet 20 mg  20 mg Oral Daily Mike Berntsen, [name], MD   20 mg at 05/09/22 0723   atropine 1 % ophthalmic  solution 1 drop  1 drop Sublingual QID [name], MD   1 drop at 05/09/22 1233   benztropine (COGENTIN) tablet 0.5 mg  0.5 mg Oral BID Hetal Proano T, MD   0.5 mg at 05/09/22 0623   clonazePAM (KLONOPIN) tablet 1 mg  1 mg Oral TID PRN He, [name], MD   1 mg at 05/09/22 7628   cloZAPine (CLOZARIL) tablet 300 mg  300 mg Oral QHS Cerys Winget T, MD   300 mg at 05/08/22 2121   diphenhydrAMINE (BENADRYL) capsule 50 mg  50 mg Oral Q6H PRN Gabriele Zwilling T, MD   50 mg at 05/09/22 3151   Or   diphenhydrAMINE (BENADRYL) injection 50 mg  50 mg Intramuscular Q6H PRN Rahiem Schellinger T, MD       gabapentin (NEURONTIN) capsule 300 mg  300 mg Oral TID Aisley Whan T, MD   300 mg at  05/09/22 1108   haloperidol (HALDOL) tablet 5 mg  5 mg Oral TID Ariannah Arenson T, MD   5 mg at 05/09/22 1108   haloperidol (HALDOL) tablet 5 mg  5 mg Oral Q6H PRN Rochanda Harpham T, MD   5 mg at 05/03/22 1813   Or   haloperidol lactate (HALDOL) injection 5 mg  5 mg Intramuscular Q6H PRN Jenefer Woerner T, MD       magnesium hydroxide (MILK OF MAGNESIA) suspension 30 mL  30 mL Oral Daily PRN Nation Cradle T, MD   30 mL at 05/02/22 7616   metoprolol succinate (TOPROL-XL) 24 hr tablet 25 mg  25 mg Oral Daily Merick Kelleher T, MD   25 mg at 05/09/22 0737   paliperidone (INVEGA SUSTENNA) injection 156 mg  156 mg Intramuscular Q28 days Wallie Lagrand T, MD   156 mg at 04/26/22 1000   temazepam (RESTORIL) capsule 15 mg  15 mg Oral QHS Koal Eslinger T, MD   15 mg at 05/08/22 2121   ziprasidone (GEODON) injection 20 mg  20 mg Intramuscular Q12H PRN Rayquon Uselman, [name], MD   20 mg at 05/03/22 0103    Lab Results: No results found for this or any previous visit (from the past 48 hour(s)).  Blood Alcohol level:  Lab Results  Component Value Date   ETH <10 07/20/2021    Metabolic Disorder Labs: Lab Results  Component Value Date   HGBA1C 5.5 04/10/2022   MPG 111.15 04/10/2022   No results found for: PROLACTIN Lab Results  Component Value Date   CHOL 167 11/20/2021   TRIG 107 11/20/2021   HDL 26 (L) 11/20/2021   CHOLHDL 6.4 11/20/2021   VLDL 21 11/20/2021   LDLCALC 120 (H) 11/20/2021    Physical Findings: AIMS: Facial and Oral Movements Muscles of Facial Expression: None, normal Lips and Perioral Area: None, normal Jaw: None, normal Tongue: None, normal,Extremity Movements Upper (arms, wrists, hands, fingers): None, normal Lower (legs, knees, ankles, toes): None, normal, Trunk Movements Neck, shoulders, hips: None, normal, Overall Severity Severity of abnormal movements (highest score from questions above): None, normal Incapacitation due to abnormal movements: None, normal Patient's awareness  of abnormal movements (rate only patient's report): No Awareness, Dental Status Current problems with teeth and/or dentures?: No Does patient usually wear dentures?: No  CIWA:    COWS:     Musculoskeletal: Strength & Muscle Tone: within normal limits Gait & Station: normal Patient leans: N/A  Psychiatric Specialty Exam:  Presentation  General Appearance: Disheveled  Eye Contact:Fleeting  Speech:Garbled  Speech Volume:Decreased  Handedness:Right  Mood and Affect  Mood:Anxious  Affect:Constricted   Thought Process  Thought Processes:Irrevelant  Descriptions of Associations:Loose  Orientation:None  Thought Content:Illogical  History of Schizophrenia/Schizoaffective disorder:Yes  Duration of Psychotic Symptoms:Greater than six months  Hallucinations:No data recorded Ideas of Reference:Paranoia  Suicidal Thoughts:No data recorded Homicidal Thoughts:No data recorded  Sensorium  Memory:Immediate Poor; Remote Poor  Judgment:Impaired  Insight:Lacking   Executive Functions  Concentration:Poor  Attention Span:Poor  Recall:Poor  Fund of Knowledge:Poor  Language:Poor   Psychomotor Activity  Psychomotor Activity:No data recorded  Assets  Assets:Housing; Resilience; Social Support; Talents/Skills   Sleep  Sleep:No data recorded   Physical Exam: Physical Exam Vitals and nursing note reviewed.  Constitutional:      Appearance: Normal appearance.  HENT:     Head: Normocephalic and atraumatic.     Mouth/Throat:     Pharynx: Oropharynx is clear.  Eyes:     Pupils: Pupils are equal, round, and reactive to light.  Cardiovascular:     Rate and Rhythm: Normal rate and regular rhythm.  Pulmonary:     Effort: Pulmonary effort is normal.     Breath sounds: Normal breath sounds.  Abdominal:     General: Abdomen is flat.     Palpations: Abdomen is soft.  Musculoskeletal:        General: Normal range of motion.  Skin:    General: Skin is warm  and dry.  Neurological:     General: No focal deficit present.     Mental Status: He is alert. Mental status is at baseline.  Psychiatric:        Attention and Perception: He is inattentive.        Mood and Affect: Affect is inappropriate.        Speech: He is noncommunicative.   Review of Systems  Unable to perform ROS: Language  Constitutional: Negative.   HENT: Negative.    Eyes: Negative.   Respiratory: Negative.    Cardiovascular: Negative.   Gastrointestinal: Negative.   Musculoskeletal: Negative.   Skin: Negative.   Neurological: Negative.   Blood pressure 126/90, pulse 88, temperature 98 F (36.7 C), temperature source Oral, resp. rate 18, height 5\' 8"  (1.727 m), weight 85.8 kg, SpO2 97 %. Body mass index is 28.76 kg/m.   Treatment Plan Summary: Medication management and Plan continue medication for now.  I will be redrawing his absolute neutrophil count today as it needs to be done 3 times a week at this level until we get back above 1500.  No need to interrupt treatment yet however.  , MD 05/09/2022, 1:33 PM

## 2022-05-09 NOTE — Plan of Care (Signed)
D: Pt alert and oriented. Pt rates depression 5/10, hopelessness 0/10, and anxiety 0/10. Pt goal: "Discharge." Pt reports energy level as normal and concentration as being good. Pt reports sleep last night as being good. Pt did  receive medications for sleep and did find them helpful. Pt denies experiencing any pain at this time. Pt denies experiencing any SI/HI, or AVH at this time.   A: Scheduled medications administered to pt, per MD orders. Support and encouragement provided. Frequent verbal contact made. Routine safety checks conducted q15 minutes.   R: No adverse drug reactions noted. Pt verbally contracts for safety at this time. Pt compliant with medications and treatment plan with the exception of Haldol this morning in which the MD was notified and aware. Pt interacts well with others on the unit. Pt remains safe at this time. Will continue to monitor.   Problem: Education: Goal: Ability to state activities that reduce stress will improve Outcome: Not Progressing   Problem: Coping: Goal: Ability to identify and develop effective coping behavior will improve Outcome: Not Progressing

## 2022-05-09 NOTE — Plan of Care (Signed)
  Problem: Safety: Goal: Violent Restraint(s) Outcome: Progressing   Problem: Education: Goal: Ability to state activities that reduce stress will improve Outcome: Progressing   Problem: Coping: Goal: Ability to identify and develop effective coping behavior will improve Outcome: Progressing   Problem: Self-Concept: Goal: Ability to identify factors that promote anxiety will improve Outcome: Progressing Goal: Level of anxiety will decrease Outcome: Progressing Goal: Ability to modify response to factors that promote anxiety will improve Outcome: Progressing

## 2022-05-09 NOTE — Progress Notes (Signed)
1:1 hourly rounding   0730: Pt in hallway with MHT present as pt's assigned Recruitment consultant.  0830: Pt in bedroom with MHT present as pt's assigned Recruitment consultant.  0930: Pt in dayroom with MHT present as pt's assigned Recruitment consultant.  1030: Pt in dayroom with MHT present as pt's assigned Recruitment consultant.  1130: Pt in dayroom with MHT present as pt's assigned Recruitment consultant.  1230: Pt in bedroom with MHT present as pt's assigned Recruitment consultant.  1330: Pt in bedroom with MHT present as pt's assigned Recruitment consultant.  1430: Pt in dayroom with MHT present as pt's assigned Recruitment consultant.  1530: Pt in dayroom with MHT present as pt's assigned safety sitter.  1630: Pt in dayroom with MHT present as pt's assigned Recruitment consultant.  1730: Pt in bedroom with MHT present as pt's assigned safety sitter.  1830: Pt in bedroom with MHT present as pt's assigned safety sitter.  1900: Pt in hallway outside of nurse's station.

## 2022-05-09 NOTE — Progress Notes (Addendum)
Recreation Therapy Notes  Date: 05/09/2022   Time: 11:00 am    Location: Craft room     Behavioral response: Appropriate   Intervention Topic: Time management    Discussion/Intervention:  Group content today was focused on time management. The group defined time management and identified healthy ways to manage time. Individuals expressed how much of the 24 hours they use in a day. Patients expressed how much time they use just for themselves personally. The group expressed how they have managed their time in the past. Individuals participated in the intervention "Managing Life" where they had a chance to see how much of the 24 hours they use and where it goes. Clinical Observations/Feedback: Patient came to group late due to unknown reasons. Individual was social with peers and staff while participating in the intervention.  Mana Haberl LRT/CTRS           Congetta Odriscoll 05/09/2022 12:56 PM

## 2022-05-09 NOTE — Progress Notes (Signed)
2200 - Patient in the day room watching tv with sitter. Pleasant mood no issue to note.   0000 - Patient in bed asleep. No distress noted. Remains with 1:1 sitter   0200 - Patient in bed asleep. No distress noted. Remains with 1:1 sitter   0400 - Patient in bed asleep.  No distress noted. Remains with 1:1 sitter

## 2022-05-09 NOTE — Progress Notes (Signed)
Patient on 1:1 while awake with a sitter present. Pt calm and observed interacting appropriately with staff and peers on the unit. Pt compliant with medication administration per MD orders. Pt remains safe on the unit.

## 2022-05-09 NOTE — Progress Notes (Signed)
Patient remains with 1:1 sitter for safety and monitoring.  He is in a pleasant mood this evening.  He is med compliant and received his medication without incident.  He denies si  hi avh depression and anxiety at this encounter.  Will continue to monitor with q 15 minute safety checks.    C Butler-Nicholson, LPN

## 2022-05-10 LAB — CBC WITH DIFFERENTIAL/PLATELET
Abs Immature Granulocytes: 0.01 10*3/uL (ref 0.00–0.07)
Basophils Absolute: 0 10*3/uL (ref 0.0–0.1)
Basophils Relative: 1 %
Eosinophils Absolute: 0 10*3/uL (ref 0.0–0.5)
Eosinophils Relative: 0 %
HCT: 40.8 % (ref 39.0–52.0)
Hemoglobin: 13.6 g/dL (ref 13.0–17.0)
Immature Granulocytes: 0 %
Lymphocytes Relative: 40 %
Lymphs Abs: 1.1 10*3/uL (ref 0.7–4.0)
MCH: 28.9 pg (ref 26.0–34.0)
MCHC: 33.3 g/dL (ref 30.0–36.0)
MCV: 86.8 fL (ref 80.0–100.0)
Monocytes Absolute: 0.3 10*3/uL (ref 0.1–1.0)
Monocytes Relative: 11 %
Neutro Abs: 1.4 10*3/uL — ABNORMAL LOW (ref 1.7–7.7)
Neutrophils Relative %: 48 %
Platelets: 182 10*3/uL (ref 150–400)
RBC: 4.7 MIL/uL (ref 4.22–5.81)
RDW: 12.9 % (ref 11.5–15.5)
WBC: 2.8 10*3/uL — ABNORMAL LOW (ref 4.0–10.5)
nRBC: 0 % (ref 0.0–0.2)

## 2022-05-10 NOTE — Progress Notes (Signed)
Pt in bed asleep. 

## 2022-05-10 NOTE — Progress Notes (Signed)
Asleep in bed.

## 2022-05-10 NOTE — Progress Notes (Signed)
Recreation Therapy Notes  Date: 05/10/2022   Time: 10:45am   Location: Court yard     Behavioral response: N/A   Intervention Topic: Decision Making     Discussion/Intervention: Patient refused to attend group.    Clinical Observations/Feedback:  Patient refused to attend group.    Ellias Mcelreath LRT/CTRS          Darwin Guastella 05/10/2022 10:56 AM

## 2022-05-10 NOTE — Plan of Care (Signed)
See progress note. Problem: Safety: Goal: Violent Restraint(s) Outcome: Progressing   Problem: Education: Goal: Ability to state activities that reduce stress will improve Outcome: Progressing   Problem: Coping: Goal: Ability to identify and develop effective coping behavior will improve Outcome: Progressing   Problem: Self-Concept: Goal: Ability to identify factors that promote anxiety will improve Outcome: Progressing Goal: Level of anxiety will decrease Outcome: Progressing Goal: Ability to modify response to factors that promote anxiety will improve Outcome: Progressing   Problem: Education: Goal: Knowledge of General Education information will improve Description: Including pain rating scale, medication(s)/side effects and non-pharmacologic comfort measures Outcome: Progressing   Problem: Health Behavior/Discharge Planning: Goal: Ability to manage health-related needs will improve Outcome: Progressing   Problem: Clinical Measurements: Goal: Ability to maintain clinical measurements within normal limits will improve Outcome: Progressing Goal: Will remain free from infection Outcome: Progressing Goal: Diagnostic test results will improve Outcome: Progressing Goal: Respiratory complications will improve Outcome: Progressing Goal: Cardiovascular complication will be avoided Outcome: Progressing   Problem: Activity: Goal: Risk for activity intolerance will decrease Outcome: Progressing   Problem: Nutrition: Goal: Adequate nutrition will be maintained Outcome: Progressing   Problem: Coping: Goal: Level of anxiety will decrease Outcome: Progressing   Problem: Elimination: Goal: Will not experience complications related to bowel motility Outcome: Progressing Goal: Will not experience complications related to urinary retention Outcome: Progressing   Problem: Pain Managment: Goal: General experience of comfort will improve Outcome: Progressing   Problem:  Safety: Goal: Ability to remain free from injury will improve Outcome: Progressing   Problem: Skin Integrity: Goal: Risk for impaired skin integrity will decrease Outcome: Progressing

## 2022-05-10 NOTE — Progress Notes (Signed)
Pt slept until around 9 am. Pt visible on the unit, minimal interaction with staff and peers. Pt has difficulty communication and processing information, due past TBI and stroke. Pt denies SI/HI and AVH. He is with his sitter to assist as needed. Pt smiling and taking his medications .At times he communicates by making noises and nodding his head. Pt cooperating with staff and following directions. His gait is steady and coordinated.

## 2022-05-10 NOTE — Progress Notes (Signed)
Pharmacy Consult - Clozapine     61 yo male ordered clozapine 300 mg PO qHS    Clozapine [facility] enrollment Verified: yes Clozapine [facility]S enrollment: 01/04/22  REMS patient ID: [id number]     Dose Adjustments This Admission:   Decrease dose from 375mg  to 300mg  on  4/10     Dose Adjustments This Admission: 2/3 started on clozapine 25mg   2/10 clozapine 100 mg  2/20 clozapine 175 mg 2/22 clozapine 200 mg  3/06 clozapine 250 mg 3/08 clozapine 300 mg 3/17 clozapine 350 mg 4/10 clozapine 300 mg   Plan: clozapine 300mg  qHS as previously ordered by Dr Weber Cooks [place] 6/8 = 1400, will continue [place] monitoring at 3x/week     **CLOZAPINE MONITORING** (reflects NEW REMS GUIDELINES EFFECTIVE 09/13/2014): Check ANC at least weekly while inpatient.   For general population patients, i.e., those without benign ethnic neutropenia (BEN): --If [place] 1000-1499, increase ANC monitoring to 3x/wk --If ANC < 1000, HOLD CLOZAPINE and get psych consult

## 2022-05-10 NOTE — Progress Notes (Signed)
10 am Pt awake, ambulating in hall, making noises at times. 11 am In and out of his room and visible on the unit 12 noon In his room for short periods time listening to music. 1300 Pt ambulating in hall, calling out staff names and waving. 1400 Pt loud at times, but following directions. 1500 Pt in room briefly and walking the halls. 1600 Pt visible on the unit, in and out of room, and easy to redirect when loud. 1700 Pt in room he remains on sitter observation. 1800 Pt in room listening to music, smiling and showing what he is listening too. He nods no to SI/HI and AVH.

## 2022-05-10 NOTE — Progress Notes (Signed)
[facility] MD Progress Note  05/10/2022 2:11 PM Adam [name]  MRN:  [id number] Subjective: Follow-up daily with this patient with schizophrenia and brain injury.  Patient himself of course has no complaint.  Occasionally vocalizes loudly especially in the afternoon but for the most part Adam [name] is keeping himself together.  Not overly disruptive.  Labs reviewed.  Absolute neutrophil count today 1.4 only slightly improved from the fifth.  This will need to be checked again in another couple days. 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 No pertinent surgical history. Family History: History reviewed. No pertinent family history. Family Psychiatric  History: See previous Social History:  Social History   Substance and Sexual Activity  Alcohol Use Not Currently     Social History   Substance and Sexual Activity  Drug Use Not Currently    Social History   Socioeconomic History   Marital status: Single    Spouse name: Not on file   Number of children: Not on file   Years of education: Not on file   Highest education level: Not on file  Occupational History   Not on file  Tobacco Use   Smoking status: Never    Passive exposure: Never   Smokeless tobacco: Never  Vaping Use   Vaping Use: Unknown  Substance and Sexual Activity   Alcohol use: Not Currently   Drug  use: Not Currently   Sexual activity: Not Currently  Other Topics Concern   Not on file  Social History Narrative   Not on file   Social Determinants of Health   Financial Resource Strain: Not on file  Food Insecurity: Not on file  Transportation Needs: Not on file  Physical Activity: Not on file  Stress: Not on file  Social Connections: Not on file   Additional Social History:                         Sleep: Fair  Appetite:  Fair  Current Medications: Current Facility-Administered Medications  Medication Dose Route Frequency Provider Last Rate Last Admin   acetaminophen (TYLENOL) tablet 650 mg  650 mg Oral Q6H PRN Geraldin Habermehl T, MD   650 mg at 04/26/22 0637   alum & mag hydroxide-simeth (MAALOX/MYLANTA) 200-200-20 MG/5ML suspension 30 mL  30 mL Oral Q4H PRN Jomel Whittlesey T, MD   30 mL at 04/26/22 3846   aspirin EC tablet 81 mg  81 mg Oral Daily Chirag Krueger, [name], MD   81 mg at 05/10/22 0744   atorvastatin (LIPITOR) tablet 20 mg  20 mg Oral Daily Raza Bayless T, MD   20 mg at 05/10/22 0745   atropine 1 % ophthalmic solution 1 drop  1 drop Sublingual QID [name], MD   1 drop at 05/10/22 1229   benztropine (COGENTIN) tablet 0.5 mg  0.5 mg Oral BID Oluwadamilola Deliz, [name], MD  0.5 mg at 05/10/22 0744   clonazePAM (KLONOPIN) tablet 1 mg  1 mg Oral TID PRN Adam [name], [name], MD   1 mg at 05/09/22 9509   cloZAPine (CLOZARIL) tablet 300 mg  300 mg Oral QHS Shalev Helminiak T, MD   300 mg at 05/09/22 2122   diphenhydrAMINE (BENADRYL) capsule 50 mg  50 mg Oral Q6H PRN Tabathia Knoche, [name], MD   50 mg at 05/09/22 3267   Or   diphenhydrAMINE (BENADRYL) injection 50 mg  50 mg Intramuscular Q6H PRN Yonathan Perrow, [name], MD       gabapentin (NEURONTIN) capsule 300 mg  300 mg Oral TID Jamerica Snavely, [name], MD   300 mg at 05/10/22 1229   haloperidol (HALDOL) tablet 5 mg  5 mg Oral TID Erdine Hulen, [name], MD   5 mg at 05/10/22 1229   haloperidol (HALDOL) tablet 5 mg  5 mg Oral Q6H PRN Bethani Brugger, [name], MD   5 mg at  05/03/22 1813   Or   haloperidol lactate (HALDOL) injection 5 mg  5 mg Intramuscular Q6H PRN Abiha Lukehart, [name], MD       magnesium hydroxide (MILK OF MAGNESIA) suspension 30 mL  30 mL Oral Daily PRN Xander Jutras T, MD   30 mL at 05/02/22 1245   metoprolol succinate (TOPROL-XL) 24 hr tablet 25 mg  25 mg Oral Daily Wilberta Dorvil T, MD   25 mg at 05/10/22 0745   paliperidone (INVEGA SUSTENNA) injection 156 mg  156 mg Intramuscular Q28 days Latonya Knight T, MD   156 mg at 04/26/22 1000   temazepam (RESTORIL) capsule 15 mg  15 mg Oral QHS Debbe Crumble T, MD   15 mg at 05/09/22 2122   ziprasidone (GEODON) injection 20 mg  20 mg Intramuscular Q12H PRN Jerimah Witucki, [name], MD   20 mg at 05/03/22 0103    Lab Results:  Results for orders placed or performed during the hospital encounter of 03/19/22 (from the past 48 hour(s))  CBC with Differential/Platelet     Status: Abnormal   Collection Time: 05/10/22 10:27 AM  Result Value Ref Range   WBC 2.8 (L) 4.0 - 10.5 K/uL   RBC 4.70 4.22 - 5.81 MIL/uL   Hemoglobin 13.6 13.0 - 17.0 g/dL   HCT 80.9 98.3 - 38.2 %   MCV 86.8 80.0 - 100.0 fL   MCH 28.9 26.0 - 34.0 pg   MCHC 33.3 30.0 - 36.0 g/dL   RDW 50.5 39.7 - 67.3 %   Platelets 182 150 - 400 K/uL   nRBC 0.0 0.0 - 0.2 %   Neutrophils Relative % 48 %   Neutro Abs 1.4 (L) 1.7 - 7.7 K/uL   Lymphocytes Relative 40 %   Lymphs Abs 1.1 0.7 - 4.0 K/uL   Monocytes Relative 11 %   Monocytes Absolute 0.3 0.1 - 1.0 K/uL   Eosinophils Relative 0 %   Eosinophils Absolute 0.0 0.0 - 0.5 K/uL   Basophils Relative 1 %   Basophils Absolute 0.0 0.0 - 0.1 K/uL   Immature Granulocytes 0 %   Abs Immature Granulocytes 0.01 0.00 - 0.07 K/uL    Comment: Performed at [facility], [address] [place], [place] [address]    Blood Alcohol level:  Lab Results  Component Value Date   [facility] <10 07/20/2021    Metabolic Disorder Labs: Lab Results  Component Value Date   HGBA1C 5.5 04/10/2022   MPG 111.15  04/10/2022   No results  found for: "PROLACTIN" Lab Results  Component Value Date   CHOL 167 11/20/2021   TRIG 107 11/20/2021   HDL 26 (L) 11/20/2021   CHOLHDL 6.4 11/20/2021   VLDL 21 11/20/2021   LDLCALC 120 (H) 11/20/2021    Physical Findings: AIMS: Facial and Oral Movements Muscles of Facial Expression: None, normal Lips and Perioral Area: None, normal Jaw: None, normal Tongue: None, normal,Extremity Movements Upper (arms, wrists, hands, fingers): None, normal Lower (legs, knees, ankles, toes): None, normal, Trunk Movements Neck, shoulders, hips: None, normal, Overall Severity Severity of abnormal movements (highest score from questions above): None, normal Incapacitation due to abnormal movements: None, normal Patient's awareness of abnormal movements (rate only patient's report): No Awareness, Dental Status Current problems with teeth and/or dentures?: No Does patient usually wear dentures?: No  CIWA:    COWS:     Musculoskeletal: Strength & Muscle Tone: within normal limits Gait & Station: normal Patient leans: N/A  Psychiatric Specialty Exam:  Presentation  General Appearance: Disheveled  Eye Contact:Fleeting  Speech:Garbled  Speech Volume:Decreased  Handedness:Right   Mood and Affect  Mood:Anxious  Affect:Constricted   Thought Process  Thought Processes:Irrevelant  Descriptions of Associations:Loose  Orientation:None  Thought Content:Illogical  History of Schizophrenia/Schizoaffective disorder:Yes  Duration of Psychotic Symptoms:Greater than six months  Hallucinations:No data recorded Ideas of Reference:Paranoia  Suicidal Thoughts:No data recorded Homicidal Thoughts:No data recorded  Sensorium  Memory:Immediate Poor; Remote Poor  Judgment:Impaired  Insight:Lacking   Executive Functions  Concentration:Poor  Attention Span:Poor  Recall:Poor  Fund of Knowledge:Poor  Language:Poor   Psychomotor Activity  Psychomotor  Activity:No data recorded  Assets  Assets:Housing; Resilience; Social Support; Talents/Skills   Sleep  Sleep:No data recorded   Physical Exam: Physical Exam Vitals and nursing note reviewed.  Constitutional:      Appearance: Normal appearance.  HENT:     Head: Normocephalic and atraumatic.     Mouth/Throat:     Pharynx: Oropharynx is clear.  Eyes:     Pupils: Pupils are equal, round, and reactive to light.  Cardiovascular:     Rate and Rhythm: Normal rate and regular rhythm.  Pulmonary:     Effort: Pulmonary effort is normal.     Breath sounds: Normal breath sounds.  Abdominal:     General: Abdomen is flat.     Palpations: Abdomen is soft.  Musculoskeletal:        General: Normal range of motion.  Skin:    General: Skin is warm and dry.  Neurological:     General: No focal deficit present.     Mental Status: Adam [name] is alert. Mental status is at baseline.  Psychiatric:        Attention and Perception: Adam [name] is inattentive.        Mood and Affect: Affect is labile.        Speech: Adam [name] is noncommunicative.    Review of Systems  Unable to perform ROS: Language   Blood pressure 117/89, pulse 81, temperature 98.1 F (36.7 C), temperature source Oral, resp. rate 18, height  (1.727 m), weight 85.8 kg, SpO2 99 %. Body mass index is 28.76 kg/m.   Treatment Plan Summary: Medication management and Plan the absolute neutrophil count is still under 1.5 and we will need to continue on 3 times a week monitoring for now.  I note his total white count is slightly lower but his platelet count is not significantly lower and the rest of his indices look okay.  No change to medicine for today.  COVID vaccine next week.  Mordecai Rasmussen, MD 05/10/2022, 2:11 PM

## 2022-05-11 NOTE — Progress Notes (Signed)
Patient is pleasant and cooperative.  He is active on the unit and enjoys hanging out in the day room with his peers.  He remains with 1:1 sitter for safety and redirection.  He is med compliant and received his QHS medication without issue. Will continue to monitor with q 15 min safety checks and 1:1 sitter.       C Butler-Nicholson, LPN

## 2022-05-11 NOTE — Progress Notes (Signed)
Recreation Therapy Notes  Date: 05/11/2022   Time: 1:20pm     Location: Courtyard    Behavioral response: Appropriate   Intervention Topic: Wellness    Discussion/Intervention:  Group content today was focused on Wellness. The group defined wellness and some positive ways they make decisions for themselves. Individuals expressed reasons why they neglected any wellness in the past. Patients described ways to improve wellness skills in the future. The group explained what could happen if they did not do any wellness at all. Participants express how bad choices has affected them and others around them. Individual explained the importance of wellness. The group participated in the intervention "Testing my Wellness" where they had a chance to identify some of their weaknesses and strengths in wellness.  Clinical Observations/Feedback: Patient came to group and was able to explore and identify wellness activities that they can use outside of the hospital. Individual was social with peers and staff while participating in the intervention   Estoria Geary LRT/CTRS              Makaia Rappa 05/11/2022 2:40 PM

## 2022-05-11 NOTE — Plan of Care (Signed)
D: Pt alert and oriented. Pt denies experiencing any anxiety/depression at this time. Pt denies experiencing any pain at this time. Pt denies experiencing any SI/HI, or AVH at this time.   A: Scheduled medications administered to pt, per MD orders. Support and encouragement provided. Frequent verbal contact made. Routine safety checks conducted q15 minutes.   R: No adverse drug reactions noted. Pt verbally contracts for safety at this time. Pt compliant with medications. Pt interacts with others on the unit. Pt remains safe at this time. Will continue to monitor.   Problem: Self-Concept: Goal: Level of anxiety will decrease Outcome: Not Progressing Goal: Ability to modify response to factors that promote anxiety will improve Outcome: Not Progressing

## 2022-05-11 NOTE — Plan of Care (Signed)
  Problem: Safety: Goal: Violent Restraint(s) Outcome: Progressing   Problem: Education: Goal: Ability to state activities that reduce stress will improve Outcome: Progressing   Problem: Coping: Goal: Ability to identify and develop effective coping behavior will improve Outcome: Progressing   Problem: Self-Concept: Goal: Ability to identify factors that promote anxiety will improve Outcome: Progressing Goal: Level of anxiety will decrease Outcome: Progressing Goal: Ability to modify response to factors that promote anxiety will improve Outcome: Progressing   Problem: Skin Integrity: Goal: Risk for impaired skin integrity will decrease Outcome: Progressing   Problem: Safety: Goal: Ability to remain free from injury will improve Outcome: Progressing   Problem: Pain Managment: Goal: General experience of comfort will improve Outcome: Progressing   Problem: Elimination: Goal: Will not experience complications related to bowel motility Outcome: Progressing Goal: Will not experience complications related to urinary retention Outcome: Progressing

## 2022-05-11 NOTE — Progress Notes (Signed)
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 No pertinent surgical history. Family History: History reviewed. No pertinent family history. Family Psychiatric  History: See previous Social History:  Social History   Substance and Sexual Activity  Alcohol Use Not Currently     Social History   Substance and Sexual Activity  Drug Use Not Currently    Social History   Socioeconomic History   Marital status: Single    Spouse name: Not on file   Number of children: Not on file   Years of education: Not on file   Highest education level: Not on file  Occupational History   Not on file  Tobacco Use   Smoking status: Never    Passive exposure: Never   Smokeless tobacco: Never  Vaping Use   Vaping Use: Unknown  Substance and Sexual Activity   Alcohol use: Not Currently   Drug use: Not Currently   Sexual activity: Not Currently  Other Topics Concern   Not on file  Social History Narrative   Not on file   Social Determinants of Health   Financial Resource Strain: Not on file  Food Insecurity: Not on file  Transportation Needs: Not on file  Physical Activity: Not on file  Stress: Not on file  Social Connections: Not on file   Additional Social History:                          Sleep: Fair  Appetite:  Fair  Current Medications: Current Facility-Administered Medications  Medication Dose Route Frequency Provider Last Rate Last Admin   acetaminophen (TYLENOL) tablet 650 mg  650 mg Oral Q6H PRN Charm Stenner T, MD   650 mg at 04/26/22 0637   alum & mag hydroxide-simeth (MAALOX/MYLANTA) 200-200-20 MG/5ML suspension 30 mL  30 mL Oral Q4H PRN Deeric Cruise T, MD   30 mL at 04/26/22 [id number]   aspirin EC tablet 81 mg  81 mg Oral Daily Posie Lillibridge, [name], MD   81 mg at 05/11/22 0744   atorvastatin (LIPITOR) tablet 20 mg  20 mg Oral Daily Leita Lindbloom T, MD   20 mg at 05/11/22 0744   atropine 1 % ophthalmic solution 1 drop  1 drop Sublingual QID [name], MD   1 drop at 05/11/22 1600   benztropine (COGENTIN) tablet 0.5 mg  0.5 mg Oral BID Shantay Sonn T, MD   0.5 mg at 05/11/22 1600   clonazePAM (KLONOPIN) tablet 1 mg  1 mg Oral TID PRN He, [name], MD   1 mg at 05/09/22 0924   cloZAPine (CLOZARIL) tablet 300 mg  300 mg Oral QHS Marcin Holte T, MD   300 mg at 05/10/22 2108   diphenhydrAMINE (BENADRYL) capsule 50 mg  50 mg Oral Q6H PRN Marquez Ceesay  T, MD   50 mg at 05/09/22 [id number]   Or   diphenhydrAMINE (BENADRYL) injection 50 mg  50 mg Intramuscular Q6H PRN Sharaya Boruff, [name], MD       gabapentin (NEURONTIN) capsule 300 mg  300 mg Oral TID Guilianna Mckoy, [name], MD   300 mg at 05/11/22 1600   haloperidol (HALDOL) tablet 5 mg  5 mg Oral TID Lennis Rader, [name], MD   5 mg at 05/11/22 1600   haloperidol (HALDOL) tablet 5 mg  5 mg Oral Q6H PRN Tahlia Deamer, [name], MD   5 mg at 05/03/22 1813   Or   haloperidol lactate (HALDOL) injection 5 mg  5 mg Intramuscular Q6H PRN Charniece Venturino, [name], MD       magnesium hydroxide (MILK OF MAGNESIA) suspension 30 mL  30 mL Oral Daily PRN Brenner Visconti T, MD   30 mL at 05/02/22 [id number]   metoprolol succinate (TOPROL-XL) 24 hr tablet 25 mg  25 mg Oral Daily Sidnee Gambrill T, MD   25 mg at 05/11/22 0744   paliperidone (INVEGA SUSTENNA)  injection 156 mg  156 mg Intramuscular Q28 days Torian Quintero T, MD   156 mg at 04/26/22 1000   temazepam (RESTORIL) capsule 15 mg  15 mg Oral QHS Latorria Zeoli T, MD   15 mg at 05/10/22 2108   ziprasidone (GEODON) injection 20 mg  20 mg Intramuscular Q12H PRN Julianne Chamberlin, [name], MD   20 mg at 05/03/22 0103    Lab Results:  Results for orders placed or performed during the hospital encounter of 03/19/22 (from the past 48 hour(s))  CBC with Differential/Platelet     Status: Abnormal   Collection Time: 05/10/22 10:27 AM  Result Value Ref Range   WBC 2.8 (L) 4.0 - 10.5 K/uL   RBC 4.70 4.22 - 5.81 MIL/uL   Hemoglobin 13.6 13.0 - 17.0 g/dL   HCT 40.8 39.0 - 52.0 %   MCV 86.8 80.0 - 100.0 fL   MCH 28.9 26.0 - 34.0 pg   MCHC 33.3 30.0 - 36.0 g/dL   RDW 12.9 11.5 - 15.5 %   Platelets 182 150 - 400 K/uL   nRBC 0.0 0.0 - 0.2 %   Neutrophils Relative % 48 %   Neutro Abs 1.4 (L) 1.7 - 7.7 K/uL   Lymphocytes Relative 40 %   Lymphs Abs 1.1 0.7 - 4.0 K/uL   Monocytes Relative 11 %   Monocytes Absolute 0.3 0.1 - 1.0 K/uL   Eosinophils Relative 0 %   Eosinophils Absolute 0.0 0.0 - 0.5 K/uL   Basophils Relative 1 %   Basophils Absolute 0.0 0.0 - 0.1 K/uL   Immature Granulocytes 0 %   Abs Immature Granulocytes 0.01 0.00 - 0.07 K/uL    Comment: Performed at [facility], [address] [place], [place] [address]    Blood Alcohol level:  Lab Results  Component Value Date   [facility] <10 [id number]    Metabolic Disorder Labs: Lab Results  Component Value Date   HGBA1C 5.5 04/10/2022   MPG 111.15 04/10/2022   No results found for: "PROLACTIN" Lab Results  Component Value Date   CHOL 167 11/20/2021   TRIG 107 11/20/2021   HDL 26 (L) 11/20/2021   CHOLHDL 6.4 11/20/2021   VLDL 21 11/20/2021   LDLCALC 120 (H) 11/20/2021    Physical Findings: AIMS: Facial and Oral Movements Muscles of Facial Expression: None, normal Lips and Perioral Area: None, normal Jaw: None, normal Tongue:  None, normal,Extremity Movements Upper (arms, wrists, hands, fingers): None, normal Lower (legs, knees, ankles, toes): None, normal, Trunk Movements Neck, shoulders, hips: None, normal, Overall Severity Severity of abnormal movements (highest score from questions above): None, normal Incapacitation due to abnormal movements: None, normal Patient's awareness of abnormal movements (rate only patient's report): No Awareness, Dental Status Current problems with teeth and/or dentures?: No Does patient usually wear dentures?: No  CIWA:    COWS:     Musculoskeletal: Strength & Muscle Tone: within normal limits Gait & Station: normal Patient leans: N/A  Psychiatric Specialty Exam:  Presentation  General Appearance: Disheveled  Eye Contact:Fleeting  Speech:Garbled  Speech Volume:Decreased  Handedness:Right   Mood and Affect  Mood:Anxious  Affect:Constricted   Thought Process  Thought Processes:Irrevelant  Descriptions of Associations:Loose  Orientation:None  Thought Content:Illogical  History of Schizophrenia/Schizoaffective disorder:Yes  Duration of Psychotic Symptoms:Greater than six months  Hallucinations:No data recorded Ideas of Reference:Paranoia  Suicidal Thoughts:No data recorded Homicidal Thoughts:No data recorded  Sensorium  Memory:Immediate Poor; Remote Poor  Judgment:Impaired  Insight:Lacking   Executive Functions  Concentration:Poor  Attention Span:Poor  Recall:Poor  Fund of Knowledge:Poor  Language:Poor   Psychomotor Activity  Psychomotor Activity:No data recorded  Assets  Assets:Housing; Resilience; Social Support; Talents/Skills   Sleep  Sleep:No data recorded   Physical Exam: Physical Exam Vitals and nursing note reviewed.  Constitutional:      Appearance: Normal appearance.  HENT:     Head: Normocephalic and atraumatic.     Mouth/Throat:     Pharynx: Oropharynx is clear.  Eyes:     Pupils: Pupils are equal,  round, and reactive to light.  Cardiovascular:     Rate and Rhythm: Normal rate and regular rhythm.  Pulmonary:     Effort: Pulmonary effort is normal.     Breath sounds: Normal breath sounds.  Abdominal:     General: Abdomen is flat.     Palpations: Abdomen is soft.  Musculoskeletal:        General: Normal range of motion.  Skin:    General: Skin is warm and dry.  Neurological:     General: No focal deficit present.     Mental Status: He is alert. Mental status is at baseline.  Psychiatric:        Attention and Perception: He is inattentive.        Mood and Affect: Mood normal. Affect is labile.        Speech: He is noncommunicative.    Review of Systems  Unable to perform ROS: Language   Blood pressure 126/88, pulse 87, temperature 98.2 F (36.8 C), resp. rate 18, height 5\' 8"  (1.727 m), weight 85.8 kg, SpO2 99 %. Body mass index is 28.76 kg/m.   Treatment Plan Summary: Medication management and Plan no change to medication.  We continue to monitor the absolute neutrophil count this week regularly hoping it will come back up.  For now continue Clozapine.  Alethia Berthold, MD 05/11/2022, 4:38 PM

## 2022-05-12 NOTE — Progress Notes (Signed)
[facility] MD Progress Note  05/12/2022 10:43 AM Adam [name]  MRN:  [id number] Subjective:  Patient remains intermittently agitated and irritable, but is directable. He is mostly compliant with his meds and has been sleeping /eating well.  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 No pertinent surgical history. Family History: History reviewed. No pertinent family history. Family Psychiatric  History:  Social History:  Social History   Substance and Sexual Activity  Alcohol Use Not Currently     Social History   Substance and Sexual Activity  Drug Use Not Currently    Social History   Socioeconomic History   Marital status: Single    Spouse name: Not on file   Number of children: Not on file   Years of education: Not on file   Highest education level: Not on file  Occupational History   Not on file  Tobacco Use   Smoking status: Never    Passive exposure: Never   Smokeless tobacco: Never  Vaping Use   Vaping Use: Unknown  Substance and Sexual Activity   Alcohol use: Not Currently   Drug use: Not Currently   Sexual activity: Not Currently  Other Topics Concern   Not on file  Social History Narrative   Not on file   Social Determinants of Health   Financial Resource Strain: Not on file  Food Insecurity: Not on file  Transportation Needs: Not on file  Physical  Activity: Not on file  Stress: Not on file  Social Connections: Not on file   Additional Social History:                         Sleep: Fair  Appetite:  Fair  Current Medications: Current Facility-Administered Medications  Medication Dose Route Frequency Provider Last Rate Last Admin   acetaminophen (TYLENOL) tablet 650 mg  650 mg Oral Q6H PRN [name], [name], MD   650 mg at 04/26/22 0637   alum & mag hydroxide-simeth (MAALOX/MYLANTA) 200-200-20 MG/5ML suspension 30 mL  30 mL Oral Q4H PRN [name], [name], MD   30 mL at 04/26/22 5621   aspirin EC tablet 81 mg  81 mg Oral Daily [name], [name], MD   81 mg at 05/12/22 3086   atorvastatin (LIPITOR) tablet 20 mg  20 mg Oral Daily [name], [name], MD   20 mg at 05/12/22 5784   atropine 1 % ophthalmic solution 1 drop  1 drop Sublingual QID [name], MD   1 drop at 05/12/22 0832   benztropine (COGENTIN) tablet 0.5 mg  0.5 mg Oral BID [name], [name], MD   0.5 mg at 05/12/22 6962   clonazePAM (KLONOPIN) tablet 1 mg  1 mg Oral TID PRN He, [name], MD   1 mg at 05/11/22 2127   cloZAPine (CLOZARIL) tablet 300 mg  300 mg Oral QHS [name], [name], MD   300 mg at  05/11/22 2127   diphenhydrAMINE (BENADRYL) capsule 50 mg  50 mg Oral Q6H PRN [name], [name], MD   50 mg at 05/09/22 7078   Or   diphenhydrAMINE (BENADRYL) injection 50 mg  50 mg Intramuscular Q6H PRN [name], [name], MD       gabapentin (NEURONTIN) capsule 300 mg  300 mg Oral TID [name], [name], MD   300 mg at 05/12/22 6754   haloperidol (HALDOL) tablet 5 mg  5 mg Oral TID [name], [name], MD   5 mg at 05/12/22 4920   haloperidol (HALDOL) tablet 5 mg  5 mg Oral Q6H PRN [name], [name], MD   5 mg at 05/03/22 1813   Or   haloperidol lactate (HALDOL) injection 5 mg  5 mg Intramuscular Q6H PRN [name], [name], MD       magnesium hydroxide (MILK OF MAGNESIA) suspension 30 mL  30 mL Oral Daily PRN [name], [name], MD   30 mL at 05/02/22 1007   metoprolol succinate (TOPROL-XL) 24 hr  tablet 25 mg  25 mg Oral Daily [name], [name], MD   25 mg at 05/12/22 1219   paliperidone (INVEGA SUSTENNA) injection 156 mg  156 mg Intramuscular Q28 days [name], [name], MD   156 mg at 04/26/22 1000   temazepam (RESTORIL) capsule 15 mg  15 mg Oral QHS [name], [name], MD   15 mg at 05/11/22 2127   ziprasidone (GEODON) injection 20 mg  20 mg Intramuscular Q12H PRN [name], [name], MD   20 mg at 05/03/22 0103    Lab Results: No results found for this or any previous visit (from the past 48 hour(s)).  Blood Alcohol level:  Lab Results  Component Value Date   ETH <10 07/20/2021    Metabolic Disorder Labs: Lab Results  Component Value Date   HGBA1C 5.5 04/10/2022   MPG 111.15 04/10/2022   No results found for: "PROLACTIN" Lab Results  Component Value Date   CHOL 167 11/20/2021   TRIG 107 11/20/2021   HDL 26 (L) 11/20/2021   CHOLHDL 6.4 11/20/2021   VLDL 21 11/20/2021   LDLCALC 120 (H) 11/20/2021    Physical Findings: AIMS: Facial and Oral Movements Muscles of Facial Expression: None, normal Lips and Perioral Area: None, normal Jaw: None, normal Tongue: None, normal,Extremity Movements Upper (arms, wrists, hands, fingers): None, normal Lower (legs, knees, ankles, toes): None, normal, Trunk Movements Neck, shoulders, hips: None, normal, Overall Severity Severity of abnormal movements (highest score from questions above): None, normal Incapacitation due to abnormal movements: None, normal Patient's awareness of abnormal movements (rate only patient's report): No Awareness, Dental Status Current problems with teeth and/or dentures?: No Does patient usually wear dentures?: No  CIWA:    COWS:     Musculoskeletal: Strength & Muscle Tone: within normal limits Gait & Station: normal Patient leans: Front  Psychiatric Specialty Exam:  Presentation  General Appearance: Disheveled  Eye Contact:Fleeting  Speech:Garbled  Speech  Volume:Decreased  Handedness:Right   Mood and Affect  Mood:Anxious  Affect:Constricted   Thought Process  Thought Processes:Irrevelant  Descriptions of Associations:Loose  Orientation:None  Thought Content:Illogical  History of Schizophrenia/Schizoaffective disorder:Yes  Duration of Psychotic Symptoms:Greater than six months  Hallucinations:No data recorded Ideas of Reference:Paranoia  Suicidal Thoughts:No data recorded Homicidal Thoughts:No data recorded  Sensorium  Memory:Immediate Poor; Remote Poor  Judgment:Impaired  Insight:Lacking   Executive Functions  Concentration:Poor  Attention Span:Poor  Recall:Poor  Fund of Knowledge:Poor  Language:Poor   Psychomotor  Activity  Psychomotor Activity:No data recorded  Assets  Assets:Housing; Resilience; Social Support; Talents/Skills   Sleep  Sleep:No data recorded   Physical Exam: Physical Exam Vitals and nursing note reviewed.  Constitutional:      Appearance: Normal appearance. He is normal weight.  HENT:     Head: Normocephalic and atraumatic.     Nose: Nose normal.     Mouth/Throat:     Mouth: Mucous membranes are moist.     Pharynx: Oropharynx is clear.  Eyes:     Extraocular Movements: Extraocular movements intact.     Conjunctiva/sclera: Conjunctivae normal.     Pupils: Pupils are equal, round, and reactive to light.  Cardiovascular:     Rate and Rhythm: Normal rate and regular rhythm.     Pulses: Normal pulses.     Heart sounds: Normal heart sounds.  Pulmonary:     Effort: Pulmonary effort is normal.     Breath sounds: Normal breath sounds.  Abdominal:     General: Abdomen is flat. Bowel sounds are normal.     Palpations: Abdomen is soft.  Musculoskeletal:        General: Normal range of motion.     Cervical back: Normal range of motion and neck supple.  Skin:    General: Skin is warm and dry.  Neurological:     Mental Status: He is alert. Mental status is at baseline.     Review of Systems  Constitutional: Negative.   HENT: Negative.    Eyes: Negative.   Respiratory: Negative.    Cardiovascular: Negative.   Gastrointestinal: Negative.   Genitourinary: Negative.   Musculoskeletal: Negative.   Skin: Negative.   Neurological: Negative.   Endo/Heme/Allergies: Negative.   Psychiatric/Behavioral:  Positive for memory loss. The patient is nervous/anxious.    Blood pressure 109/88, pulse 97, temperature 97.7 F (36.5 C), temperature source Oral, resp. rate 17, height 5\' 8"  (1.727 m), weight 85.8 kg, SpO2 99 %. Body mass index is 28.76 kg/m.   Treatment Plan Summary: Daily contact with patient to assess and evaluate symptoms and progress in treatment, Medication management, and Plan Continue current meds and doses.   05/12/2022, 10:43 AM

## 2022-05-12 NOTE — Progress Notes (Signed)
1:1 Hourly Rounding   0730: Pt in bedroom with assigned sitter present.  0830: Pt in dayroom eating with assigned sitter present.   0930: Pt in hallway by phones irritable with MHT present.  1030: Pt in bedroom with assigned sitter present.  1130: Pt in bedroom sleeping with assigned sitter present.  1230: Pt in bedroom with assigned sitter present.  1330: Pt in bedroom with assigned sitter present.  1430: Pt in dayroom with MHT sitting.   1530: Pt in bedroom with assigned sitter present.  1630: Pt in dayroom eating with assigned sitter present.   1730: Pt walking in hallway with assigned sitter present.  1830: Pt in hallway with this Clinical research associate as Comptroller.  1912: Pt in dayroom with MHT as sitter.

## 2022-05-12 NOTE — B[facility] Group Notes (Signed)
LCSW Wellness Group Note   05/12/2022 1:00pm  Type of Group and Topic: Psychoeducational Group:  Wellness  Participation Level:  did not attend  Description of Group  Wellness group introduces the topic and its focus on developing healthy habits across the spectrum and its relationship to a decrease in hospital admissions.  Six areas of wellness are discussed: physical, social spiritual, intellectual, occupational, and emotional.  Patients are asked to consider their current wellness habits and to identify areas of wellness where they are interested and able to focus on improvements.    Therapeutic Goals Patients will understand components of wellness and how they can positively impact overall health.  Patients will identify areas of wellness where they have developed good habits. Patients will identify areas of wellness where they would like to make improvements.    Summary of Patient Progress     Therapeutic Modalities: Cognitive Behavioral Therapy Psychoeducation    Helio Lack Jon, LCSW   

## 2022-05-12 NOTE — Progress Notes (Signed)
Patient alert and oriented x 4 no distress noted he is interacting appropriately with peers and staff, no distress noted, he was complaint with medication this shift. 15 minutes safety checks maintained will closely monitor. 

## 2022-05-12 NOTE — Plan of Care (Signed)
D: Pt alert and oriented. Pt denies experiencing any anxiety/depression at this time. Pt denies experiencing any pain at this time. Pt denies experiencing any SI/HI, or AVH at this time.   Pt was irritable this morning and had to be redirected away from the search room because an admission was being preformed. Pt eventually calmed down. Pt loud, agitated, and frustrated again this evening. Pt had to be asked to leave the medication room d/t behavior. Pt unable to communicate his thoughts. After some time and prn medication pt was able to calm down and communicate through gestures of what he wanted to get across.   A: Scheduled medications administered to pt, per MD orders. Support and encouragement provided. Frequent verbal contact made. Routine safety checks conducted q15 minutes.   R: No adverse drug reactions noted. Pt verbally contracts for safety at this time. Pt compliant with medications. Pt interacts well sometimes and poor other times with others on the unit. Pt remains safe at this time. Will continue to monitor.   Problem: Self-Concept: Goal: Ability to modify response to factors that promote anxiety will improve Outcome: Progressing   Problem: Activity: Goal: Risk for activity intolerance will decrease Outcome: Progressing

## 2022-05-13 LAB — CBC WITH DIFFERENTIAL/PLATELET
Abs Immature Granulocytes: 0.01 10*3/uL (ref 0.00–0.07)
Basophils Absolute: 0 10*3/uL (ref 0.0–0.1)
Basophils Relative: 0 %
Eosinophils Absolute: 0 10*3/uL (ref 0.0–0.5)
Eosinophils Relative: 0 %
HCT: 41 % (ref 39.0–52.0)
Hemoglobin: 13.8 g/dL (ref 13.0–17.0)
Immature Granulocytes: 0 %
Lymphocytes Relative: 30 %
Lymphs Abs: 1.4 10*3/uL (ref 0.7–4.0)
MCH: 29.3 pg (ref 26.0–34.0)
MCHC: 33.7 g/dL (ref 30.0–36.0)
MCV: 87 fL (ref 80.0–100.0)
Monocytes Absolute: 0.6 10*3/uL (ref 0.1–1.0)
Monocytes Relative: 13 %
Neutro Abs: 2.5 10*3/uL (ref 1.7–7.7)
Neutrophils Relative %: 57 %
Platelets: 196 10*3/uL (ref 150–400)
RBC: 4.71 MIL/uL (ref 4.22–5.81)
RDW: 13.2 % (ref 11.5–15.5)
WBC: 4.5 10*3/uL (ref 4.0–10.5)
nRBC: 0 % (ref 0.0–0.2)

## 2022-05-13 NOTE — Progress Notes (Signed)
[facility] MD Progress Note  05/13/2022 10:27 AM Adam [name]  MRN:  [id number] Subjective:  Pt was calm and cooperative for majority of shift.  Pt cooperative with staff directions.  Pt took medications without any resistance.  Pt was generally quiet and appropriate during the evening hours.  Pt rested overnight with no c/o distress.  Patient presents in a calm and pleasant manner today and denies major issues. He is compliant with his meds and is denying current SI/HI or any AVH.  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 No pertinent surgical history. Family History: History reviewed. No pertinent family history. Family Psychiatric  History:  Social History:  Social History   Substance and Sexual Activity  Alcohol Use Not Currently     Social History   Substance and Sexual Activity  Drug Use Not Currently    Social History   Socioeconomic History   Marital status: Single    Spouse name: Not on file   Number of children: Not on file   Years of education: Not on file   Highest education level: Not on file  Occupational History   Not on file  Tobacco Use   Smoking status: Never    Passive exposure: Never   Smokeless tobacco: Never  Vaping Use   Vaping Use: Unknown  Substance and Sexual Activity   Alcohol use: Not Currently   Drug use: Not Currently   Sexual activity: Not  Currently  Other Topics Concern   Not on file  Social History Narrative   Not on file   Social Determinants of Health   Financial Resource Strain: Not on file  Food Insecurity: Not on file  Transportation Needs: Not on file  Physical Activity: Not on file  Stress: Not on file  Social Connections: Not on file   Additional Social History:                         Sleep: Good  Appetite:  Fair  Current Medications: Current Facility-Administered Medications  Medication Dose Route Frequency Provider Last Rate Last Admin   acetaminophen (TYLENOL) tablet 650 mg  650 mg Oral Q6H PRN [name], [name], MD   650 mg at 04/26/22 0637   alum & mag hydroxide-simeth (MAALOX/MYLANTA) 200-200-20 MG/5ML suspension 30 mL  30 mL Oral Q4H PRN [name], [name], MD   30 mL at 04/26/22 3953   aspirin EC tablet 81 mg  81 mg Oral Daily [name], [name], MD   81 mg at 05/13/22 0735   atorvastatin (LIPITOR) tablet 20 mg  20 mg Oral Daily [name], [name], MD   20 mg at 05/13/22 0735   atropine 1 % ophthalmic solution 1 drop  1 drop Sublingual QID [name], MD   1 drop at 05/13/22 0737   benztropine (COGENTIN) tablet 0.5 mg  0.5 mg Oral BID [name], [name], MD   0.5  mg at 05/13/22 0735   clonazePAM (KLONOPIN) tablet 1 mg  1 mg Oral TID PRN He, [name], MD   1 mg at 05/12/22 1622   cloZAPine (CLOZARIL) tablet 300 mg  300 mg Oral QHS [name], [name], MD   300 mg at 05/12/22 2112   diphenhydrAMINE (BENADRYL) capsule 50 mg  50 mg Oral Q6H PRN [name], [name], MD   50 mg at 05/12/22 1158   Or   diphenhydrAMINE (BENADRYL) injection 50 mg  50 mg Intramuscular Q6H PRN [name], [name], MD       gabapentin (NEURONTIN) capsule 300 mg  300 mg Oral TID [name], [name], MD   300 mg at 05/13/22 0735   haloperidol (HALDOL) tablet 5 mg  5 mg Oral TID [name], [name], MD   5 mg at 05/13/22 0735   haloperidol (HALDOL) tablet 5 mg  5 mg Oral Q6H PRN [name], [name], MD   5 mg at 05/03/22 1813   Or   haloperidol lactate  (HALDOL) injection 5 mg  5 mg Intramuscular Q6H PRN [name], [name], MD       magnesium hydroxide (MILK OF MAGNESIA) suspension 30 mL  30 mL Oral Daily PRN [name], [name], MD   30 mL at 05/02/22 2376   metoprolol succinate (TOPROL-XL) 24 hr tablet 25 mg  25 mg Oral Daily [name], [name], MD   25 mg at 05/13/22 0735   paliperidone (INVEGA SUSTENNA) injection 156 mg  156 mg Intramuscular Q28 days [name], [name], MD   156 mg at 04/26/22 1000   temazepam (RESTORIL) capsule 15 mg  15 mg Oral QHS [name], [name], MD   15 mg at 05/12/22 2112   ziprasidone (GEODON) injection 20 mg  20 mg Intramuscular Q12H PRN [name], [name], MD   20 mg at 05/03/22 0103    Lab Results:  Results for orders placed or performed during the hospital encounter of 03/19/22 (from the past 48 hour(s))  CBC with Differential/Platelet     Status: None   Collection Time: 05/13/22  9:09 AM  Result Value Ref Range   WBC 4.5 4.0 - 10.5 K/uL   RBC 4.71 4.22 - 5.81 MIL/uL   Hemoglobin 13.8 13.0 - 17.0 g/dL   HCT 28.3 15.1 - 76.1 %   MCV 87.0 80.0 - 100.0 fL   MCH 29.3 26.0 - 34.0 pg   MCHC 33.7 30.0 - 36.0 g/dL   RDW 60.7 37.1 - 06.2 %   Platelets 196 150 - 400 K/uL   nRBC 0.0 0.0 - 0.2 %   Neutrophils Relative % 57 %   Neutro Abs 2.5 1.7 - 7.7 K/uL   Lymphocytes Relative 30 %   Lymphs Abs 1.4 0.7 - 4.0 K/uL   Monocytes Relative 13 %   Monocytes Absolute 0.6 0.1 - 1.0 K/uL   Eosinophils Relative 0 %   Eosinophils Absolute 0.0 0.0 - 0.5 K/uL   Basophils Relative 0 %   Basophils Absolute 0.0 0.0 - 0.1 K/uL   Immature Granulocytes 0 %   Abs Immature Granulocytes 0.01 0.00 - 0.07 K/uL    Comment: Performed at [facility], [address] [place], [place] [address]    Blood Alcohol level:  Lab Results  Component Value Date   [facility] <10 07/20/2021    Metabolic Disorder Labs: Lab Results  Component Value Date   HGBA1C 5.5 04/10/2022   MPG 111.15 04/10/2022   No results found for: "PROLACTIN"  Lab Results   Component Value Date   CHOL 167 11/20/2021   TRIG 107 11/20/2021   HDL 26 (L) 11/20/2021   CHOLHDL 6.4 11/20/2021   VLDL 21 11/20/2021   LDLCALC 120 (H) 11/20/2021    Physical Findings: AIMS: Facial and Oral Movements Muscles of Facial Expression: None, normal Lips and Perioral Area: None, normal Jaw: None, normal Tongue: None, normal,Extremity Movements Upper (arms, wrists, hands, fingers): None, normal Lower (legs, knees, ankles, toes): None, normal, Trunk Movements Neck, shoulders, hips: None, normal, Overall Severity Severity of abnormal movements (highest score from questions above): None, normal Incapacitation due to abnormal movements: None, normal Patient's awareness of abnormal movements (rate only patient's report): No Awareness, Dental Status Current problems with teeth and/or dentures?: No Does patient usually wear dentures?: No  CIWA:    COWS:     Musculoskeletal: Strength & Muscle Tone: within normal limits Gait & Station: normal Patient leans: N/A  Psychiatric Specialty Exam:  Presentation  General Appearance: Casual  Eye Contact:Absent  Speech:Garbled  Speech Volume:Decreased  Handedness:Right   Mood and Affect  Mood:Anxious; Irritable  Affect:Constricted   Thought Process  Thought Processes:Disorganized  Descriptions of Associations:Loose  Orientation:Partial  Thought Content:Illogical  History of Schizophrenia/Schizoaffective disorder:Yes  Duration of Psychotic Symptoms:Greater than six months  Hallucinations:Hallucinations: Auditory  Ideas of Reference:Paranoia  Suicidal Thoughts:No data recorded Homicidal Thoughts:No data recorded  Sensorium  Memory:Immediate Poor; Remote Poor  Judgment:Impaired  Insight:Poor   Executive Functions  Concentration:Poor  Attention Span:Poor  Recall:Poor  Fund of Knowledge:Poor  Language:Poor   Psychomotor Activity  Psychomotor Activity:Psychomotor Activity:  Decreased   Assets  Assets:Housing; Resilience; Social Support; Talents/Skills   Sleep  Sleep:Sleep: Fair    Physical Exam: Physical Exam Vitals and nursing note reviewed.  Constitutional:      Appearance: Normal appearance. He is normal weight.  HENT:     Head: Normocephalic and atraumatic.     Right Ear: External ear normal.     Left Ear: External ear normal.     Nose: Nose normal.     Mouth/Throat:     Mouth: Mucous membranes are moist.     Pharynx: Oropharynx is clear.  Eyes:     Extraocular Movements: Extraocular movements intact.     Conjunctiva/sclera: Conjunctivae normal.     Pupils: Pupils are equal, round, and reactive to light.  Cardiovascular:     Rate and Rhythm: Normal rate and regular rhythm.     Pulses: Normal pulses.     Heart sounds: Normal heart sounds.  Pulmonary:     Effort: Pulmonary effort is normal.     Breath sounds: Normal breath sounds.  Abdominal:     General: Abdomen is flat. Bowel sounds are normal.     Palpations: Abdomen is soft.  Musculoskeletal:        General: Normal range of motion.     Cervical back: Normal range of motion and neck supple.  Skin:    General: Skin is warm and dry.  Neurological:     General: No focal deficit present.     Mental Status: He is alert and oriented to person, place, and time.    Review of Systems  Constitutional: Negative.   HENT: Negative.    Eyes: Negative.   Respiratory: Negative.    Cardiovascular: Negative.   Gastrointestinal: Negative.   Genitourinary: Negative.   Musculoskeletal: Negative.   Skin: Negative.   Neurological: Negative.   Endo/Heme/Allergies: Negative.   Psychiatric/Behavioral:  The patient is nervous/anxious.  Blood pressure 108/70, pulse 68, temperature (!) 97.5 F (36.4 C), temperature source Oral, resp. rate 18, height 5\' 8"  (1.727 m), weight 85.8 kg, SpO2 99 %. Body mass index is 28.76 kg/m.   Treatment Plan Summary: Daily contact with patient to assess and  evaluate symptoms and progress in treatment, Medication management, and Plan Continue current meds and doses.   05/13/2022, 10:27 AM

## 2022-05-13 NOTE — Progress Notes (Signed)
Pharmacy Consult - Clozapine     61 yo male ordered clozapine 300 mg PO qHS    Clozapine [facility] enrollment Verified: yes Clozapine [facility] enrollment: 01/04/22  REMS patient ID: [id number]     Dose Adjustments This Admission:   Decrease dose from 375mg  to 300mg  on  4/10     Dose Adjustments This Admission: 2/3 started on clozapine 25mg   2/10 clozapine 100 mg  2/20 clozapine 175 mg 2/22 clozapine 200 mg  3/06 clozapine 250 mg 3/08 clozapine 300 mg 3/17 clozapine 350 mg 4/10 clozapine 300 mg   Plan: clozapine 300mg  qHS as previously ordered by Dr Weber Cooks [place] 6/11 = 2500 (reported to [facility] 05/13/2022), will adjust ANC monitoring back to once weekly    **CLOZAPINE MONITORING** (reflects NEW REMS GUIDELINES EFFECTIVE 09/13/2014): Check ANC at least weekly while inpatient.   For general population patients, i.e., those without benign ethnic neutropenia (BEN): --If [place] 1000-1499, increase ANC monitoring to 3x/wk --If ANC < 1000, HOLD CLOZAPINE and get psych consult

## 2022-05-13 NOTE — Plan of Care (Signed)
D: Pt alert and oriented. Pt endorses experiencing anxiety however, denies experiencing any depression at this time. Pt denies experiencing any pain at this time. Pt denies experiencing any SI/HI, or AVH at this time.   This pt allowed lab to draw his blood this morning. Pt's mood appears improved compared to yesterday.  A: Scheduled medications administered to pt, per MD orders. Support and encouragement provided. Frequent verbal contact made. Routine safety checks conducted q15 minutes.   R: No adverse drug reactions noted. Pt verbally contracts for safety at this time. Pt compliant with medications and treatment plan. Pt interacts well with others on the unit. Pt remains safe at this time. Will continue to monitor.   Problem: Nutrition: Goal: Adequate nutrition will be maintained Outcome: Progressing   Problem: Coping: Goal: Level of anxiety will decrease Outcome: Progressing

## 2022-05-13 NOTE — Progress Notes (Signed)
Pt pleasant during the beginning of the shift. Pt observed interacting appropriately with staff and peers on the unit. Pt compliant with medication administration per MD orders. Pt became agitated because he didn't have a sitter to do his nail tonight. Pt given education. Pt being monitored Q 15 minutes for safety per unit protocol. Pt remains safe on the unit.

## 2022-05-13 NOTE — Progress Notes (Signed)
1:1 Hourly Rounding  0730: Pt in bedroom with assigned sitter present.  0830: Pt in bedroom with assigned sitter present.  0930: Pt in bedroom with assigned sitter present.  1030: Pt in bedroom with assigned sitter present.  1130: Pt in dayroom with assigned sitter present.   1230: Pt in bedroom with assigned sitter present.  1330: Pt in bedroom with assigned this writer present.  1430: Pt at nurse's station with assigned sitter present.   1530: Pt in bedroom with assigned sitter present.  1630: Pt in bedroom with assigned sitter present.  1730: Pt in dayroom with assigned sitter present.   1830: Pt in dayroom with assigned sitter present.   1911: Pt in dayroom with assigned sitter present.

## 2022-05-13 NOTE — Plan of Care (Signed)
Pt was calm and cooperative for majority of shift.  Pt cooperative with staff directions.  Pt took medications without any resistance.  Pt was generally quiet and appropriate during the evening hours.  Pt rested overnight with no c/o distress.  Pt was reminded that labwork will be drawn related to clozaril and expressed understanding.

## 2022-05-14 MED ORDER — COVID-19MRNA BIVAL VACC PFIZER 30 MCG/0.3ML IM SUSP
0.3000 mL | Freq: Once | INTRAMUSCULAR | Status: DC
Start: 2022-05-14 — End: 2022-05-14
  Filled 2022-05-14: qty 0.3

## 2022-05-14 NOTE — Plan of Care (Signed)
Patient in & out of his room. Singing loud in the milieu at times. Cooperative with staff redirections. No issues verbalized. Compliant with medications. Appetite and energy level good. ADLs maintained. Support and encouragement given.

## 2022-05-14 NOTE — Progress Notes (Signed)
Pt pleasant during the beginning of the shift. Pt observed interacting appropriately with staff and peers on the unit. Pt compliant with medication administration per MD orders. Pt given education. Pt being monitored Q 15 minutes for safety per unit protocol. Pt remains safe on the unit

## 2022-05-14 NOTE — TOC Progression Note (Addendum)
Transition of Care [facility]) - Progression Note    Patient Details  Name: Adam [name] MRN: [id number] Date of Birth: Mar 19, 1961  [facility] CM/SW Contact  Rocklin Cellar, RN Phone Number: 05/14/2022, 3:36 PM  Clinical Narrative:    Outreach sent to Amalia Hailey with [facility] requesting update on placement and updating patient has now received both COVID vaccines as requested. Out of office message reports will return 05/15/22.   Outreach to [employer] [facility] [place] requesting update as well.       Expected Discharge Plan and Services                                                 Social Determinants of Health (SDOH) Interventions    Readmission Risk Interventions     No data to display

## 2022-05-14 NOTE — Progress Notes (Signed)
Recreation Therapy Notes   Date: 05/14/2022  Time: 10:30am    Location: Craft room     Behavioral response: N/A   Intervention Topic: Self-care  Discussion/Intervention: Patient refused to attend group.   Clinical Observations/Feedback:  Patient refused to attend group.    Ehan Freas LRT/CTRS         Laqueena Hinchey 05/14/2022 11:55 AM

## 2022-05-14 NOTE — Progress Notes (Signed)
[facility] MD Progress Note  05/14/2022 1:26 PM Adam [name]  MRN:  [id number] Subjective: Follow-up patient with schizophrenia and neurologic injury.  No new complaints.  Behavior the same as always.  Does best when he has a Comptroller with somebody to communicate with.  Physical health seems stable.  Extremely good news, yesterday his absolute neutrophil count bounced up to 2500 without having made any changes to clozapine.  We can go back to the regular weekly schedule. 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 No pertinent surgical history. Family History: History reviewed. No pertinent family history. Family Psychiatric  History: See previous Social History:  Social History   Substance and Sexual Activity  Alcohol Use Not Currently     Social History   Substance and Sexual Activity  Drug Use Not Currently    Social History   Socioeconomic History   Marital status: Single    Spouse name: Not on file   Number of children: Not on file   Years of education: Not on file   Highest education level: Not on file  Occupational History   Not on file  Tobacco Use   Smoking status: Never    Passive exposure: Never   Smokeless tobacco: Never  Vaping Use   Vaping Use: Unknown  Substance and Sexual Activity   Alcohol use: Not Currently   Drug use:  Not Currently   Sexual activity: Not Currently  Other Topics Concern   Not on file  Social History Narrative   Not on file   Social Determinants of Health   Financial Resource Strain: Not on file  Food Insecurity: Not on file  Transportation Needs: Not on file  Physical Activity: Not on file  Stress: Not on file  Social Connections: Not on file   Additional Social History:                         Sleep: Fair  Appetite:  Fair  Current Medications: Current Facility-Administered Medications  Medication Dose Route Frequency Provider Last Rate Last Admin   acetaminophen (TYLENOL) tablet 650 mg  650 mg Oral Q6H PRN Brodric Schauer T, MD   650 mg at 04/26/22 0637   alum & mag hydroxide-simeth (MAALOX/MYLANTA) 200-200-20 MG/5ML suspension 30 mL  30 mL Oral Q4H PRN Lamir Racca T, MD   30 mL at 04/26/22 0272   aspirin EC tablet 81 mg  81 mg Oral Daily Clemmie Buelna, [name], MD   81 mg at 05/14/22 0817   atorvastatin (LIPITOR) tablet 20 mg  20 mg Oral Daily Naeema Patlan T, MD   20 mg at 05/14/22 0817   atropine 1 % ophthalmic solution 1 drop  1 drop Sublingual QID [name], MD   1 drop at 05/14/22 1201   benztropine (COGENTIN) tablet 0.5 mg  0.5 mg Oral BID Debbora Ang, [name], MD   0.5 mg  at 05/14/22 0817   clonazePAM (KLONOPIN) tablet 1 mg  1 mg Oral TID PRN He, [name], MD   1 mg at 05/12/22 1622   cloZAPine (CLOZARIL) tablet 300 mg  300 mg Oral QHS Belvie Iribe T, MD   300 mg at 05/13/22 2050   COVID-19 mRNA bivalent vaccine (Pfizer) injection 0.3 mL  0.3 mL Intramuscular Once Deniece Rankin, [name], MD       diphenhydrAMINE (BENADRYL) capsule 50 mg  50 mg Oral Q6H PRN Unice Vantassel, [name], MD   50 mg at 05/12/22 1158   Or   diphenhydrAMINE (BENADRYL) injection 50 mg  50 mg Intramuscular Q6H PRN Amellia Panik, [name], MD       gabapentin (NEURONTIN) capsule 300 mg  300 mg Oral TID Shalin Linders, [name], MD   300 mg at 05/14/22 1201   haloperidol (HALDOL) tablet 5 mg  5 mg Oral TID Lai Hendriks, [name], MD   5  mg at 05/14/22 1201   haloperidol (HALDOL) tablet 5 mg  5 mg Oral Q6H PRN Teairra Millar, [name], MD   5 mg at 05/03/22 1813   Or   haloperidol lactate (HALDOL) injection 5 mg  5 mg Intramuscular Q6H PRN Cato Liburd, [name], MD       magnesium hydroxide (MILK OF MAGNESIA) suspension 30 mL  30 mL Oral Daily PRN Nyeema Want T, MD   30 mL at 05/02/22 1093   metoprolol succinate (TOPROL-XL) 24 hr tablet 25 mg  25 mg Oral Daily Alanea Woolridge T, MD   25 mg at 05/14/22 0817   paliperidone (INVEGA SUSTENNA) injection 156 mg  156 mg Intramuscular Q28 days Lilienne Weins T, MD   156 mg at 04/26/22 1000   temazepam (RESTORIL) capsule 15 mg  15 mg Oral QHS Jennise Both T, MD   15 mg at 05/13/22 2050   ziprasidone (GEODON) injection 20 mg  20 mg Intramuscular Q12H PRN Shaindy Reader, [name], MD   20 mg at 05/03/22 0103    Lab Results:  Results for orders placed or performed during the hospital encounter of 03/19/22 (from the past 48 hour(s))  CBC with Differential/Platelet     Status: None   Collection Time: 05/13/22  9:09 AM  Result Value Ref Range   WBC 4.5 4.0 - 10.5 K/uL   RBC 4.71 4.22 - 5.81 MIL/uL   Hemoglobin 13.8 13.0 - 17.0 g/dL   HCT 23.5 57.3 - 22.0 %   MCV 87.0 80.0 - 100.0 fL   MCH 29.3 26.0 - 34.0 pg   MCHC 33.7 30.0 - 36.0 g/dL   RDW 25.4 27.0 - 62.3 %   Platelets 196 150 - 400 K/uL   nRBC 0.0 0.0 - 0.2 %   Neutrophils Relative % 57 %   Neutro Abs 2.5 1.7 - 7.7 K/uL   Lymphocytes Relative 30 %   Lymphs Abs 1.4 0.7 - 4.0 K/uL   Monocytes Relative 13 %   Monocytes Absolute 0.6 0.1 - 1.0 K/uL   Eosinophils Relative 0 %   Eosinophils Absolute 0.0 0.0 - 0.5 K/uL   Basophils Relative 0 %   Basophils Absolute 0.0 0.0 - 0.1 K/uL   Immature Granulocytes 0 %   Abs Immature Granulocytes 0.01 0.00 - 0.07 K/uL    Comment: Performed at [facility], [address] [place], [place] [address]    Blood Alcohol level:  Lab Results  Component Value Date   [facility] <10 07/20/2021    Metabolic  Disorder Labs:  Lab Results  Component Value Date   HGBA1C 5.5 04/10/2022   MPG 111.15 04/10/2022   No results found for: "PROLACTIN" Lab Results  Component Value Date   CHOL 167 11/20/2021   TRIG 107 11/20/2021   HDL 26 (L) 11/20/2021   CHOLHDL 6.4 11/20/2021   VLDL 21 11/20/2021   LDLCALC 120 (H) 11/20/2021    Physical Findings: AIMS: Facial and Oral Movements Muscles of Facial Expression: None, normal Lips and Perioral Area: None, normal Jaw: None, normal Tongue: None, normal,Extremity Movements Upper (arms, wrists, hands, fingers): None, normal Lower (legs, knees, ankles, toes): None, normal, Trunk Movements Neck, shoulders, hips: None, normal, Overall Severity Severity of abnormal movements (highest score from questions above): None, normal Incapacitation due to abnormal movements: None, normal Patient's awareness of abnormal movements (rate only patient's report): No Awareness, Dental Status Current problems with teeth and/or dentures?: No Does patient usually wear dentures?: No  CIWA:    COWS:     Musculoskeletal: Strength & Muscle Tone: within normal limits Gait & Station: normal Patient leans: N/A  Psychiatric Specialty Exam:  Presentation  General Appearance: Appropriate for Environment; Casual; Neat; Well Groomed  Eye Contact:Fair  Speech:Slow  Speech Volume:Decreased  Handedness:Left   Mood and Affect  Mood:Anxious  Affect:Congruent   Thought Process  Thought Processes:Goal Directed  Descriptions of Associations:Circumstantial  Orientation:Partial  Thought Content:Scattered  History of Schizophrenia/Schizoaffective disorder:Yes  Duration of Psychotic Symptoms:Greater than six months  Hallucinations:Hallucinations: None  Ideas of Reference:None  Suicidal Thoughts:Suicidal Thoughts: No  Homicidal Thoughts:Homicidal Thoughts: No   Sensorium  Memory:Remote Good  Judgment:Fair  Insight:Fair   Executive Functions   Concentration:Fair  Attention Span:Fair  Recall:Fair  Fund of Knowledge:Fair  Language:Fair   Psychomotor Activity  Psychomotor Activity:Psychomotor Activity: Normal   Assets  Assets:Housing; Resilience; Social Support; Talents/Skills   Sleep  Sleep:Sleep: Fair    Physical Exam: Physical Exam Vitals and nursing note reviewed.  Constitutional:      Appearance: Normal appearance.  HENT:     Head: Normocephalic and atraumatic.     Mouth/Throat:     Pharynx: Oropharynx is clear.  Eyes:     Pupils: Pupils are equal, round, and reactive to light.  Cardiovascular:     Rate and Rhythm: Normal rate and regular rhythm.  Pulmonary:     Effort: Pulmonary effort is normal.     Breath sounds: Normal breath sounds.  Abdominal:     General: Abdomen is flat.     Palpations: Abdomen is soft.  Musculoskeletal:        General: Normal range of motion.  Skin:    General: Skin is warm and dry.  Neurological:     General: No focal deficit present.     Mental Status: He is alert. Mental status is at baseline.  Psychiatric:        Attention and Perception: He is inattentive.        Mood and Affect: Affect is labile.        Speech: He is noncommunicative.    Review of Systems  Unable to perform ROS: Language  Constitutional: Negative.   HENT: Negative.    Eyes: Negative.   Respiratory: Negative.    Cardiovascular: Negative.   Gastrointestinal: Negative.   Musculoskeletal: Negative.   Skin: Negative.   Neurological: Negative.    Blood pressure 119/84, pulse 95, temperature 98.3 F (36.8 C), temperature source Oral, resp. rate 18, height 5\' 8"  (1.727 m), weight 85.8 kg, SpO2 99 %. Body mass index is  28.76 kg/m.   Treatment Plan Summary: Plan patient will receive his second COVID-vaccine today since it has been 2 weeks since the last one.  This was a condition placed by the last possible discharge place to even looking at him so we can perhaps proceed with that.  Not a  guarantee but at least it is good for him.  Continue Clozapine and go back to weekly schedule.  Mordecai Rasmussen, MD 05/14/2022, 1:26 PM

## 2022-05-15 NOTE — TOC Progression Note (Addendum)
Transition of Care [facility]) - Progression Note    Patient Details  Name: Adam [name] MRN: [id number] Date of Birth: [birth year]  [facility] CM/SW Contact  [place], RN Phone Number: 05/15/2022, 3:09 PM  Clinical Narrative:    Received request for additional information from [facility] from [facility] bond @ [phone] ext [phone]. Requesting assessment of new communication device trial implementation. Will outreach to ST for assistance with obtaining information.   Also requesting COVID records be faxed to [facility]  Office: [phone]     Fax: [phone] [email or web address]       Expected Discharge Plan and Services                                                 Social Determinants of Health (SDOH) Interventions    Readmission Risk Interventions     No data to display

## 2022-05-15 NOTE — Progress Notes (Signed)
Recreation Therapy Notes  Date: 05/15/2022   Time: 10:00 am   Location: Court yard     Behavioral response: N/A   Intervention Topic: Honesty    Discussion/Intervention: Patient refused to attend group.    Clinical Observations/Feedback:  Patient refused to attend group.    Donte Lenzo LRT/CTRS        Ziyon Soltau 05/15/2022 10:09 AM

## 2022-05-15 NOTE — Progress Notes (Signed)
Pt visible on the unit in all the common areas. He make loud noises, and playing with his phone

## 2022-05-15 NOTE — Plan of Care (Signed)
See progress note. Problem: Education: Goal: Ability to state activities that reduce stress will improve Outcome: Progressing   Problem: Coping: Goal: Ability to identify and develop effective coping behavior will improve Outcome: Progressing   Problem: Self-Concept: Goal: Ability to identify factors that promote anxiety will improve Outcome: Progressing Goal: Level of anxiety will decrease Outcome: Progressing Goal: Ability to modify response to factors that promote anxiety will improve Outcome: Progressing   Problem: Education: Goal: Knowledge of General Education information will improve Description: Including pain rating scale, medication(s)/side effects and non-pharmacologic comfort measures Outcome: Progressing   Problem: Health Behavior/Discharge Planning: Goal: Ability to manage health-related needs will improve Outcome: Progressing   Problem: Clinical Measurements: Goal: Ability to maintain clinical measurements within normal limits will improve Outcome: Progressing Goal: Will remain free from infection Outcome: Progressing Goal: Diagnostic test results will improve Outcome: Progressing Goal: Respiratory complications will improve Outcome: Progressing Goal: Cardiovascular complication will be avoided Outcome: Progressing   Problem: Activity: Goal: Risk for activity intolerance will decrease Outcome: Progressing   Problem: Nutrition: Goal: Adequate nutrition will be maintained Outcome: Progressing   Problem: Coping: Goal: Level of anxiety will decrease Outcome: Progressing   Problem: Elimination: Goal: Will not experience complications related to bowel motility Outcome: Progressing Goal: Will not experience complications related to urinary retention Outcome: Progressing   Problem: Pain Managment: Goal: General experience of comfort will improve Outcome: Progressing   Problem: Safety: Goal: Ability to remain free from injury will improve Outcome:  Progressing   Problem: Skin Integrity: Goal: Risk for impaired skin integrity will decrease Outcome: Progressing

## 2022-05-15 NOTE — Progress Notes (Signed)
[facility] MD Progress Note  05/15/2022 2:32 PM Adam [name]  MRN:  [id number] Subjective: Patient has no new complaint.  Behavior has been mostly calm and appropriate today.  Certainly no aggression.  Some vocalizing but generally redirectable.  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  Schizophrenia and brain injury  Past Medical History:  Past Medical History:  Diagnosis Date   Myocardial infarction (HCC)    Schizophrenia (HCC)    Stroke (HCC)    TBI (traumatic brain injury) (HCC)    History reviewed. No pertinent surgical history. Family History: History reviewed. No pertinent family history. Family Psychiatric  History: See previous Social History:  Social History   Substance and Sexual Activity  Alcohol Use Not Currently     Social History   Substance and Sexual Activity  Drug Use Not Currently    Social History   Socioeconomic History   Marital status: Single    Spouse name: Not on file   Number of children: Not on file   Years of education: Not on file   Highest education level: Not on file  Occupational History   Not on file  Tobacco Use   Smoking status: Never    Passive exposure: Never   Smokeless tobacco: Never  Vaping Use   Vaping Use: Unknown  Substance and Sexual Activity   Alcohol use: Not Currently   Drug use: Not Currently   Sexual activity: Not Currently  Other Topics Concern   Not on file  Social History Narrative   Not on file   Social Determinants of Health   Financial Resource Strain: Not on file  Food  Insecurity: Not on file  Transportation Needs: Not on file  Physical Activity: Not on file  Stress: Not on file  Social Connections: Not on file   Additional Social History:                         Sleep: Fair  Appetite:  Fair  Current Medications: Current Facility-Administered Medications  Medication Dose Route Frequency Provider Last Rate Last Admin   acetaminophen (TYLENOL) tablet 650 mg  650 mg Oral Q6H PRN Bianco Cange T, MD   650 mg at 04/26/22 0637   alum & mag hydroxide-simeth (MAALOX/MYLANTA) 200-200-20 MG/5ML suspension 30 mL  30 mL Oral Q4H PRN Rebel Willcutt T, MD   30 mL at 04/26/22 6387   aspirin EC tablet 81 mg  81 mg Oral Daily Misael Mcgaha, [name], MD   81 mg at 05/15/22 0757   atorvastatin (LIPITOR) tablet 20 mg  20 mg Oral Daily Beni Turrell T, MD   20 mg at 05/15/22 0757   atropine 1 % ophthalmic solution 1 drop  1 drop Sublingual QID [name], MD   1 drop at 05/15/22 1124   benztropine (COGENTIN) tablet 0.5 mg  0.5 mg Oral BID Kaleth Koy T, MD   0.5 mg at 05/15/22 0756   clonazePAM (KLONOPIN) tablet 1 mg  1 mg Oral TID PRN He, [name], MD   1 mg at 05/12/22 1622   cloZAPine (CLOZARIL) tablet 300 mg  300 mg  Oral QHS Kamryn Messineo, [name], MD   300 mg at 05/14/22 2107   diphenhydrAMINE (BENADRYL) capsule 50 mg  50 mg Oral Q6H PRN Buffi Ewton T, MD   50 mg at 05/12/22 1158   Or   diphenhydrAMINE (BENADRYL) injection 50 mg  50 mg Intramuscular Q6H PRN Milley Vining T, MD       gabapentin (NEURONTIN) capsule 300 mg  300 mg Oral TID Dilraj Killgore T, MD   300 mg at 05/15/22 1215   haloperidol (HALDOL) tablet 5 mg  5 mg Oral TID Shawnee Gambone, [name], MD   5 mg at 05/15/22 1215   haloperidol (HALDOL) tablet 5 mg  5 mg Oral Q6H PRN Daxon Kyne T, MD   5 mg at 05/03/22 1813   Or   haloperidol lactate (HALDOL) injection 5 mg  5 mg Intramuscular Q6H PRN Denishia Citro T, MD       magnesium hydroxide (MILK OF MAGNESIA) suspension 30 mL  30 mL Oral Daily PRN Charnita Trudel T, MD    30 mL at 05/02/22 2595   metoprolol succinate (TOPROL-XL) 24 hr tablet 25 mg  25 mg Oral Daily Armonie Staten T, MD   25 mg at 05/15/22 0756   paliperidone (INVEGA SUSTENNA) injection 156 mg  156 mg Intramuscular Q28 days Gaelen Brager T, MD   156 mg at 04/26/22 1000   temazepam (RESTORIL) capsule 15 mg  15 mg Oral QHS Nalini Alcaraz T, MD   15 mg at 05/14/22 2107   ziprasidone (GEODON) injection 20 mg  20 mg Intramuscular Q12H PRN Johnel Yielding, [name], MD   20 mg at 05/03/22 0103    Lab Results: No results found for this or any previous visit (from the past 48 hour(s)).  Blood Alcohol level:  Lab Results  Component Value Date   ETH <10 07/20/2021    Metabolic Disorder Labs: Lab Results  Component Value Date   HGBA1C 5.5 04/10/2022   MPG 111.15 04/10/2022   No results found for: "PROLACTIN" Lab Results  Component Value Date   CHOL 167 11/20/2021   TRIG 107 11/20/2021   HDL 26 (L) 11/20/2021   CHOLHDL 6.4 11/20/2021   VLDL 21 11/20/2021   LDLCALC 120 (H) 11/20/2021    Physical Findings: AIMS: Facial and Oral Movements Muscles of Facial Expression: None, normal Lips and Perioral Area: None, normal Jaw: None, normal Tongue: None, normal,Extremity Movements Upper (arms, wrists, hands, fingers): None, normal Lower (legs, knees, ankles, toes): None, normal, Trunk Movements Neck, shoulders, hips: None, normal, Overall Severity Severity of abnormal movements (highest score from questions above): None, normal Incapacitation due to abnormal movements: None, normal Patient's awareness of abnormal movements (rate only patient's report): No Awareness, Dental Status Current problems with teeth and/or dentures?: No Does patient usually wear dentures?: No  CIWA:    COWS:     Musculoskeletal: Strength & Muscle Tone: within normal limits Gait & Station: normal Patient leans: N/A  Psychiatric Specialty Exam:  Presentation  General Appearance: Appropriate for Environment; Casual;  Neat; Well Groomed  Eye Contact:Fair  Speech:Slow  Speech Volume:Decreased  Handedness:Left   Mood and Affect  Mood:Anxious  Affect:Congruent   Thought Process  Thought Processes:Goal Directed  Descriptions of Associations:Circumstantial  Orientation:Partial  Thought Content:Scattered  History of Schizophrenia/Schizoaffective disorder:Yes  Duration of Psychotic Symptoms:Greater than six months  Hallucinations:No data recorded Ideas of Reference:None  Suicidal Thoughts:No data recorded Homicidal Thoughts:No data recorded  Sensorium  Memory:Remote Good  Judgment:Fair  Insight:Fair   Executive Functions  Concentration:Fair  Attention Span:Fair  Recall:Fair  Fund of Knowledge:Fair  Language:Fair   Psychomotor Activity  Psychomotor Activity:No data recorded  Assets  Assets:Housing; Resilience; Social Support; Talents/Skills   Sleep  Sleep:No data recorded   Physical Exam: Physical Exam Vitals and nursing note reviewed.  Constitutional:      Appearance: Normal appearance.  HENT:     Head: Normocephalic and atraumatic.     Mouth/Throat:     Pharynx: Oropharynx is clear.  Eyes:     Pupils: Pupils are equal, round, and reactive to light.  Cardiovascular:     Rate and Rhythm: Normal rate and regular rhythm.  Pulmonary:     Effort: Pulmonary effort is normal.     Breath sounds: Normal breath sounds.  Abdominal:     General: Abdomen is flat.     Palpations: Abdomen is soft.  Musculoskeletal:        General: Normal range of motion.  Skin:    General: Skin is warm and dry.  Neurological:     General: No focal deficit present.     Mental Status: He is alert. Mental status is at baseline.  Psychiatric:        Attention and Perception: He is inattentive.        Mood and Affect: Affect is labile.        Speech: He is noncommunicative.    Review of Systems  Unable to perform ROS: Language   Blood pressure (!) 140/96, pulse 87,  temperature 97.8 F (36.6 C), temperature source Oral, resp. rate 18, height 5\' 8"  (1.727 m), weight 85.8 kg, SpO2 99 %. Body mass index is 28.76 kg/m.   Treatment Plan Summary: Medication management and Plan no change to medication.  Support and encouragement and continue current treatment plan waiting for placement  Mordecai Rasmussen, MD 05/15/2022, 2:32 PM

## 2022-05-15 NOTE — Progress Notes (Signed)
Pt pleasant during the beginning of the shift. Pt observed interacting appropriately with staff and peers on the unit. Pt compliant with medication administration per MD orders. Pt given education. Pt being monitored Q 15 minutes for safety per unit protocol. Pt remains safe on the unit 

## 2022-05-16 NOTE — Progress Notes (Signed)
Recreation Therapy Notes   Date: 05/16/2022  Time: 10:15 am    Location: Craft room     Behavioral response: N/A   Intervention Topic: Leisure   Discussion/Intervention: Patient refused to attend group.   Clinical Observations/Feedback:  Patient refused to attend group.    Tamma Brigandi LRT/CTRS        Bryna Razavi 05/16/2022 12:10 PM

## 2022-05-16 NOTE — Progress Notes (Signed)
[facility] MD Progress Note  05/16/2022 3:09 PM Adam [name]  MRN:  [id number] Subjective: Follow-up for this gentleman with schizophrenia.  No change in presentation or behavior.  Doing really pretty well.  Almost integrated into the milieu fully at times.  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 No pertinent surgical history. Family History: History reviewed. No pertinent family history. Family Psychiatric  History: None reported Social History:  Social History   Substance and Sexual Activity  Alcohol Use Not Currently     Social History   Substance and Sexual Activity  Drug Use Not Currently    Social History   Socioeconomic History   Marital status: Single    Spouse name: Not on file   Number of children: Not on file   Years of education: Not on file   Highest education level: Not on file  Occupational History   Not on file  Tobacco Use   Smoking status: Never    Passive exposure: Never   Smokeless tobacco: Never  Vaping Use   Vaping Use: Unknown  Substance and Sexual Activity   Alcohol use: Not Currently   Drug use: Not Currently   Sexual activity: Not Currently  Other Topics Concern   Not on file  Social History Narrative   Not on file   Social Determinants of Health   Financial Resource  Strain: Not on file  Food Insecurity: Not on file  Transportation Needs: Not on file  Physical Activity: Not on file  Stress: Not on file  Social Connections: Not on file   Additional Social History:                         Sleep: Fair  Appetite:  Fair  Current Medications: Current Facility-Administered Medications  Medication Dose Route Frequency Provider Last Rate Last Admin   acetaminophen (TYLENOL) tablet 650 mg  650 mg Oral Q6H PRN Sherol Sabas T, MD   650 mg at 04/26/22 0637   alum & mag hydroxide-simeth (MAALOX/MYLANTA) 200-200-20 MG/5ML suspension 30 mL  30 mL Oral Q4H PRN Kamarah Bilotta T, MD   30 mL at 04/26/22 4917   aspirin EC tablet 81 mg  81 mg Oral Daily Delmore Sear, [name], MD   81 mg at 05/16/22 0809   atorvastatin (LIPITOR) tablet 20 mg  20 mg Oral Daily Bexleigh Theriault T, MD   20 mg at 05/16/22 0809   atropine 1 % ophthalmic solution 1 drop  1 drop Sublingual QID [name], MD   1 drop at 05/16/22 1137   benztropine (COGENTIN) tablet 0.5 mg  0.5 mg Oral BID Stefanee Mckell T, MD   0.5 mg at 05/16/22 0809   clonazePAM (KLONOPIN) tablet 1 mg  1 mg Oral TID PRN He, [name], MD   1 mg at 05/12/22 1622   cloZAPine (CLOZARIL) tablet  300 mg  300 mg Oral QHS Reakwon Barren T, MD   300 mg at 05/15/22 2051   diphenhydrAMINE (BENADRYL) capsule 50 mg  50 mg Oral Q6H PRN Jalynn Waddell T, MD   50 mg at 05/12/22 1158   Or   diphenhydrAMINE (BENADRYL) injection 50 mg  50 mg Intramuscular Q6H PRN Jocelynne Duquette T, MD       gabapentin (NEURONTIN) capsule 300 mg  300 mg Oral TID Orli Degrave T, MD   300 mg at 05/16/22 1137   haloperidol (HALDOL) tablet 5 mg  5 mg Oral TID Shera Laubach, [name], MD   5 mg at 05/16/22 1137   haloperidol (HALDOL) tablet 5 mg  5 mg Oral Q6H PRN Malike Foglio T, MD   5 mg at 05/03/22 1813   Or   haloperidol lactate (HALDOL) injection 5 mg  5 mg Intramuscular Q6H PRN Manasa Spease T, MD       magnesium hydroxide (MILK OF MAGNESIA) suspension 30 mL  30 mL Oral  Daily PRN Pleshette Tomasini T, MD   30 mL at 05/02/22 3419   metoprolol succinate (TOPROL-XL) 24 hr tablet 25 mg  25 mg Oral Daily Silvio Sausedo T, MD   25 mg at 05/16/22 6222   paliperidone (INVEGA SUSTENNA) injection 156 mg  156 mg Intramuscular Q28 days Thera Basden T, MD   156 mg at 04/26/22 1000   temazepam (RESTORIL) capsule 15 mg  15 mg Oral QHS Jozeph Persing T, MD   15 mg at 05/15/22 2051   ziprasidone (GEODON) injection 20 mg  20 mg Intramuscular Q12H PRN Tari Lecount, [name], MD   20 mg at 05/03/22 0103    Lab Results: No results found for this or any previous visit (from the past 48 hour(s)).  Blood Alcohol level:  Lab Results  Component Value Date   ETH <10 07/20/2021    Metabolic Disorder Labs: Lab Results  Component Value Date   HGBA1C 5.5 04/10/2022   MPG 111.15 04/10/2022   No results found for: "PROLACTIN" Lab Results  Component Value Date   CHOL 167 11/20/2021   TRIG 107 11/20/2021   HDL 26 (L) 11/20/2021   CHOLHDL 6.4 11/20/2021   VLDL 21 11/20/2021   LDLCALC 120 (H) 11/20/2021    Physical Findings: AIMS: Facial and Oral Movements Muscles of Facial Expression: None, normal Lips and Perioral Area: None, normal Jaw: None, normal Tongue: None, normal,Extremity Movements Upper (arms, wrists, hands, fingers): None, normal Lower (legs, knees, ankles, toes): None, normal, Trunk Movements Neck, shoulders, hips: None, normal, Overall Severity Severity of abnormal movements (highest score from questions above): None, normal Incapacitation due to abnormal movements: None, normal Patient's awareness of abnormal movements (rate only patient's report): No Awareness, Dental Status Current problems with teeth and/or dentures?: No Does patient usually wear dentures?: No  CIWA:    COWS:     Musculoskeletal: Strength & Muscle Tone: within normal limits Gait & Station: normal Patient leans: N/A  Psychiatric Specialty Exam:  Presentation  General Appearance: Appropriate  for Environment; Casual; Neat; Well Groomed  Eye Contact:Fair  Speech:Slow  Speech Volume:Decreased  Handedness:Left   Mood and Affect  Mood:Anxious  Affect:Congruent   Thought Process  Thought Processes:Goal Directed  Descriptions of Associations:Circumstantial  Orientation:Partial  Thought Content:Scattered  History of Schizophrenia/Schizoaffective disorder:Yes  Duration of Psychotic Symptoms:Greater than six months  Hallucinations:No data recorded Ideas of Reference:None  Suicidal Thoughts:No data recorded Homicidal Thoughts:No data recorded  Sensorium  Memory:Remote Good  Judgment:Fair  Insight:Fair   Executive Functions  Concentration:Fair  Attention Span:Fair  Recall:Fair  Fund of Knowledge:Fair  Language:Fair   Psychomotor Activity  Psychomotor Activity:No data recorded  Assets  Assets:Housing; Resilience; Social Support; Talents/Skills   Sleep  Sleep:No data recorded   Physical Exam: Physical Exam Vitals and nursing note reviewed.  Constitutional:      Appearance: Normal appearance.  HENT:     Head: Normocephalic and atraumatic.     Mouth/Throat:     Pharynx: Oropharynx is clear.  Eyes:     Pupils: Pupils are equal, round, and reactive to light.  Cardiovascular:     Rate and Rhythm: Normal rate and regular rhythm.  Pulmonary:     Effort: Pulmonary effort is normal.     Breath sounds: Normal breath sounds.  Abdominal:     General: Abdomen is flat.     Palpations: Abdomen is soft.  Musculoskeletal:        General: Normal range of motion.  Skin:    General: Skin is warm and dry.  Neurological:     General: No focal deficit present.     Mental Status: He is alert. Mental status is at baseline.  Psychiatric:        Attention and Perception: He is inattentive.        Mood and Affect: Mood normal. Affect is inappropriate.        Speech: He is noncommunicative.    Review of Systems  Unable to perform ROS: Language    Blood pressure (!) 139/96, pulse 87, temperature 97.7 F (36.5 C), temperature source Oral, resp. rate 17, height 5\' 8"  (1.727 m), weight 85.8 kg, SpO2 99 %. Body mass index is 28.76 kg/m.   Treatment Plan Summary: Plan just for the record the patient is certainly doing much better than he was when he first came to the hospital and I am quite optimistic about how he would do if placed in a facility.  Really hope that we can start getting some traction on discharge now.  Fully COVID vaccinated  , MD 05/16/2022, 3:09 PM

## 2022-05-16 NOTE — Progress Notes (Signed)
Speech Language Pathology Treatment: Cognitive-Linquistic  Patient Details Name: Adam [name] MRN: [id number] DOB: 05/21/1961 Today's Date: 05/16/2022 Time: 1308-6578 SLP Time Calculation (min) (ACUTE ONLY): 40 min  Assessment / Plan / Recommendation Clinical Impression  Speech Generating Device Evaluation was conducted per request of [facility]. Report faxed to Lamm Bond at [phone].   Report can also be found in pt's chart.   HPI HPI: ST services were consulted to assess pt's ability with possibility of using a picture board or other such device to assist with communication. 61 year old man with chronic impairment from brain injury on top of schizophrenia. Pt initially under observation status in [facility] ED beginning 07/20/2022 with transfer to inpatient status on 11/20/2021 and then pt transferred to behavioral health unit on 03/19/2022. Most recent head CT reveals Chronic areas of encephalomalacia throughout the left cerebral hemisphere unchanged, so there is concern for possible language impairment.      SLP Plan  Continue with current plan of care      Recommendations for follow up therapy are one component of a multi-disciplinary discharge planning process, led by the attending physician.  Recommendations may be updated based on patient status, additional functional criteria and insurance authorization.    Recommendations                   Plan: Continue with current plan of care          Xzander Gilham B. Dreama Saa, M.S., CCC-SLP, CBIS Speech-Language Pathologist Certified Brain Injury Specialist [facility] [phone] Ascom [phone] Fax [phone]  Reuel Derby  05/16/2022, 12:51 PM

## 2022-05-16 NOTE — Progress Notes (Signed)
Pt visible on the unit he interacts with staff, but not with peers. Pt nods not when asked about SI/HI and AVH. He complained he had something in his teeth and he wants to remove his nail polish. Pt has a had time communicating due his TBI and stroke. He can communicates well at times, but he is quite demanding and needy when he wants something. He was redirected for bagging on the nursing station door and yelling when he was told to wait.

## 2022-05-17 NOTE — Plan of Care (Signed)
  Problem: Skin Integrity: Goal: Risk for impaired skin integrity will decrease Outcome: Progressing   Problem: Safety: Goal: Ability to remain free from injury will improve Outcome: Progressing   Problem: Pain Managment: Goal: General experience of comfort will improve Outcome: Progressing   Problem: Elimination: Goal: Will not experience complications related to bowel motility Outcome: Progressing Goal: Will not experience complications related to urinary retention Outcome: Progressing   Problem: Coping: Goal: Level of anxiety will decrease Outcome: Progressing   Problem: Nutrition: Goal: Adequate nutrition will be maintained Outcome: Progressing   Problem: Activity: Goal: Risk for activity intolerance will decrease Outcome: Progressing   Problem: Clinical Measurements: Goal: Ability to maintain clinical measurements within normal limits will improve Outcome: Progressing Goal: Will remain free from infection Outcome: Progressing Goal: Diagnostic test results will improve Outcome: Progressing Goal: Respiratory complications will improve Outcome: Progressing Goal: Cardiovascular complication will be avoided Outcome: Progressing

## 2022-05-17 NOTE — Progress Notes (Signed)
Recreation Therapy Notes  Date: 05/17/2022   Time: 10:40 am   Location: Courtyard     Behavioral response: Appropriate   Intervention Topic: Social Skills    Discussion/Intervention:  Group content on today was focused on social skills. The group defined social skills and identified ways they use social skills. Patients expressed what obstacles they face when trying to be social. Participants described the importance of social skills. The group listed ways to improve social skills and reasons to improve social skills. Individuals had an opportunity to learn new and improve social skills as well as identify their weaknesses. Clinical Observations/Feedback: Patient came to group and was able to express the importance of socialization. Individual was social with peers and staff while participating in the intervention.   Unika Nazareno LRT/CTRS           Kazuko Clemence 05/17/2022 12:49 PM

## 2022-05-17 NOTE — Progress Notes (Signed)
[facility] MD Progress Note  05/17/2022 4:01 PM Adam [name]  MRN:  [id number] Subjective: Follow-up 61 year old man with schizophrenia and brain injury.  No new complaints.  Behavior has been calm and cooperative today no agitation and no evidence of any new concerns. 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 No pertinent surgical history. Family History: History reviewed. No pertinent family history. Family Psychiatric  History: See previous Social History:  Social History   Substance and Sexual Activity  Alcohol Use Not Currently     Social History   Substance and Sexual Activity  Drug Use Not Currently    Social History   Socioeconomic History   Marital status: Single    Spouse name: Not on file   Number of children: Not on file   Years of education: Not on file   Highest education level: Not on file  Occupational History   Not on file  Tobacco Use   Smoking status: Never    Passive exposure: Never   Smokeless tobacco: Never  Vaping Use   Vaping Use: Unknown  Substance and Sexual Activity   Alcohol use: Not Currently   Drug use: Not Currently   Sexual activity: Not Currently  Other Topics Concern   Not on file  Social History Narrative   Not on file   Social Determinants of Health   Financial Resource Strain: Not on file  Food Insecurity:  Not on file  Transportation Needs: Not on file  Physical Activity: Not on file  Stress: Not on file  Social Connections: Not on file   Additional Social History:                         Sleep: Fair  Appetite:  Fair  Current Medications: Current Facility-Administered Medications  Medication Dose Route Frequency Provider Last Rate Last Admin   acetaminophen (TYLENOL) tablet 650 mg  650 mg Oral Q6H PRN Rylend Pietrzak T, MD   650 mg at 04/26/22 0637   alum & mag hydroxide-simeth (MAALOX/MYLANTA) 200-200-20 MG/5ML suspension 30 mL  30 mL Oral Q4H PRN Rudransh Bellanca T, MD   30 mL at 04/26/22 7829   aspirin EC tablet 81 mg  81 mg Oral Daily Othniel Maret, [name], MD   81 mg at 05/17/22 0815   atorvastatin (LIPITOR) tablet 20 mg  20 mg Oral Daily Kizer Nobbe T, MD   20 mg at 05/17/22 0815   atropine 1 % ophthalmic solution 1 drop  1 drop Sublingual QID [name], MD   1 drop at 05/17/22 1159   benztropine (COGENTIN) tablet 0.5 mg  0.5 mg Oral BID Benjaman Artman T, MD   0.5 mg at 05/17/22 0814   clonazePAM (KLONOPIN) tablet 1 mg  1 mg Oral TID PRN He, [name], MD   1 mg at 05/12/22 1622   cloZAPine (CLOZARIL) tablet 300 mg  300 mg Oral  QHS Maryan Sivak, [name], MD   300 mg at 05/16/22 2132   diphenhydrAMINE (BENADRYL) capsule 50 mg  50 mg Oral Q6H PRN Darryl Willner T, MD   50 mg at 05/12/22 1158   Or   diphenhydrAMINE (BENADRYL) injection 50 mg  50 mg Intramuscular Q6H PRN Waymond Meador T, MD       gabapentin (NEURONTIN) capsule 300 mg  300 mg Oral TID Sonji Starkes T, MD   300 mg at 05/17/22 1159   haloperidol (HALDOL) tablet 5 mg  5 mg Oral TID Adair Lauderback, [name], MD   5 mg at 05/17/22 1159   haloperidol (HALDOL) tablet 5 mg  5 mg Oral Q6H PRN Annabella Elford T, MD   5 mg at 05/03/22 1813   Or   haloperidol lactate (HALDOL) injection 5 mg  5 mg Intramuscular Q6H PRN Matayah Reyburn T, MD       magnesium hydroxide (MILK OF MAGNESIA) suspension 30 mL  30 mL Oral Daily PRN Nayla Dias T, MD   30 mL at  05/02/22 5009   metoprolol succinate (TOPROL-XL) 24 hr tablet 25 mg  25 mg Oral Daily Chip Canepa T, MD   25 mg at 05/17/22 0814   paliperidone (INVEGA SUSTENNA) injection 156 mg  156 mg Intramuscular Q28 days Keynan Heffern T, MD   156 mg at 04/26/22 1000   temazepam (RESTORIL) capsule 15 mg  15 mg Oral QHS Jo Cerone T, MD   15 mg at 05/16/22 2132   ziprasidone (GEODON) injection 20 mg  20 mg Intramuscular Q12H PRN Gustavo Dispenza, [name], MD   20 mg at 05/03/22 0103    Lab Results: No results found for this or any previous visit (from the past 48 hour(s)).  Blood Alcohol level:  Lab Results  Component Value Date   ETH <10 07/20/2021    Metabolic Disorder Labs: Lab Results  Component Value Date   HGBA1C 5.5 04/10/2022   MPG 111.15 04/10/2022   No results found for: "PROLACTIN" Lab Results  Component Value Date   CHOL 167 11/20/2021   TRIG 107 11/20/2021   HDL 26 (L) 11/20/2021   CHOLHDL 6.4 11/20/2021   VLDL 21 11/20/2021   LDLCALC 120 (H) 11/20/2021    Physical Findings: AIMS: Facial and Oral Movements Muscles of Facial Expression: None, normal Lips and Perioral Area: None, normal Jaw: None, normal Tongue: None, normal,Extremity Movements Upper (arms, wrists, hands, fingers): None, normal Lower (legs, knees, ankles, toes): None, normal, Trunk Movements Neck, shoulders, hips: None, normal, Overall Severity Severity of abnormal movements (highest score from questions above): None, normal Incapacitation due to abnormal movements: None, normal Patient's awareness of abnormal movements (rate only patient's report): No Awareness, Dental Status Current problems with teeth and/or dentures?: No Does patient usually wear dentures?: No  CIWA:    COWS:     Musculoskeletal: Strength & Muscle Tone: within normal limits Gait & Station: normal Patient leans: N/A  Psychiatric Specialty Exam:  Presentation  General Appearance: Appropriate for Environment; Casual; Neat; Well  Groomed  Eye Contact:Fair  Speech:Slow  Speech Volume:Decreased  Handedness:Left   Mood and Affect  Mood:Anxious  Affect:Congruent   Thought Process  Thought Processes:Goal Directed  Descriptions of Associations:Circumstantial  Orientation:Partial  Thought Content:Scattered  History of Schizophrenia/Schizoaffective disorder:Yes  Duration of Psychotic Symptoms:Greater than six months  Hallucinations:No data recorded Ideas of Reference:None  Suicidal Thoughts:No data recorded Homicidal Thoughts:No data recorded  Sensorium  Memory:Remote Good  Judgment:Fair  Insight:Fair   Executive Functions  Concentration:Fair  Attention Span:Fair  Recall:Fair  Fund of Knowledge:Fair  Language:Fair   Psychomotor Activity  Psychomotor Activity:No data recorded  Assets  Assets:Housing; Resilience; Social Support; Talents/Skills   Sleep  Sleep:No data recorded   Physical Exam: Physical Exam Vitals and nursing note reviewed.  Constitutional:      Appearance: Normal appearance.  HENT:     Head: Normocephalic and atraumatic.     Mouth/Throat:     Pharynx: Oropharynx is clear.  Eyes:     Pupils: Pupils are equal, round, and reactive to light.  Cardiovascular:     Rate and Rhythm: Normal rate and regular rhythm.  Pulmonary:     Effort: Pulmonary effort is normal.     Breath sounds: Normal breath sounds.  Abdominal:     General: Abdomen is flat.     Palpations: Abdomen is soft.  Musculoskeletal:        General: Normal range of motion.  Skin:    General: Skin is warm and dry.  Neurological:     General: No focal deficit present.     Mental Status: He is alert. Mental status is at baseline.  Psychiatric:        Attention and Perception: He is inattentive.        Mood and Affect: Affect is labile.        Speech: He is noncommunicative.    Review of Systems  Unable to perform ROS: Language   Blood pressure (!) 139/96, pulse 87, temperature 97.7  F (36.5 C), temperature source Oral, resp. rate 17, height 5\' 8"  (1.727 m), weight 85.8 kg, SpO2 99 %. Body mass index is 28.76 kg/m.   Treatment Plan Summary: Plan no change to medication or plan.  Patient is now off of one-to-one management and doing well mostly integrated into the whole unit environment.  Continue working on discharge planning  , MD 05/17/2022, 4:01 PM

## 2022-05-17 NOTE — Progress Notes (Signed)
Patient compliant with medications, interacting well with Peers and Staff. Denies SI/HI/A/VH and verbally contracted for safety.  Support and encouragement provided.

## 2022-05-17 NOTE — Plan of Care (Signed)
  Problem: Coping: Goal: Level of anxiety will decrease 05/17/2022 0215 by Butler-Nicholson, Elisah Parmer L, LPN Outcome: Progressing 05/17/2022 0214 by Butler-Nicholson, Tyton Abdallah L, LPN Outcome: Progressing   Problem: Activity: Goal: Risk for activity intolerance will decrease 05/17/2022 0215 by Butler-Nicholson, Afsheen Antony L, LPN Outcome: Progressing 05/17/2022 0214 by Butler-Nicholson, Dechelle Attaway L, LPN Outcome: Progressing   Problem: Activity: Goal: Risk for activity intolerance will decrease 05/17/2022 0215 by Butler-Nicholson, Kirti Carl L, LPN Outcome: Progressing 05/17/2022 0214 by Butler-Nicholson, Azahel Belcastro L, LPN Outcome: Progressing   Problem: Clinical Measurements: Goal: Cardiovascular complication will be avoided 05/17/2022 0215 by Butler-Nicholson, Aniko Finnigan L, LPN Outcome: Progressing 05/17/2022 0214 by Butler-Nicholson, Deyanna Mctier L, LPN Outcome: Progressing   Problem: Education: Goal: Ability to state activities that reduce stress will improve 05/17/2022 0215 by Butler-Nicholson, Ameya Kutz L, LPN Outcome: Progressing 05/17/2022 0214 by Butler-Nicholson, Evea Sheek L, LPN Outcome: Progressing   Problem: Coping: Goal: Ability to identify and develop effective coping behavior will improve 05/17/2022 0215 by Butler-Nicholson, Chou Busler L, LPN Outcome: Progressing 05/17/2022 0214 by Butler-Nicholson, Antonya Leeder L, LPN Outcome: Progressing   Problem: Self-Concept: Goal: Ability to identify factors that promote anxiety will improve 05/17/2022 0215 by Butler-Nicholson, Letesha Klecker L, LPN Outcome: Progressing 05/17/2022 0214 by Butler-Nicholson, Kelsen Celona L, LPN Outcome: Progressing Goal: Level of anxiety will decrease 05/17/2022 0215 by Butler-Nicholson, Dula Havlik L, LPN Outcome: Progressing 05/17/2022 0214 by Butler-Nicholson, Canden Cieslinski L, LPN Outcome: Progressing Goal: Ability to modify response to factors that promote anxiety will improve 05/17/2022 0215 by Butler-Nicholson,  Kwane Rohl L, LPN Outcome: Progressing 05/17/2022 0214 by Butler-Nicholson, Imari Reen L, LPN Outcome: Progressing   Problem: Self-Concept: Goal: Ability to identify factors that promote anxiety will improve 05/17/2022 0215 by Butler-Nicholson, Ryana Montecalvo L, LPN Outcome: Progressing 05/17/2022 0214 by Butler-Nicholson, Torey Regan L, LPN Outcome: Progressing Goal: Level of anxiety will decrease 05/17/2022 0215 by Butler-Nicholson, Safaa Stingley L, LPN Outcome: Progressing 05/17/2022 0214 by Butler-Nicholson, Jarelis Ehlert L, LPN Outcome: Progressing Goal: Ability to modify response to factors that promote anxiety will improve 05/17/2022 0215 by Butler-Nicholson, Melesio Madara L, LPN Outcome: Progressing 05/17/2022 0214 by Butler-Nicholson, Renata Gambino L, LPN Outcome: Progressing   Problem: Self-Concept: Goal: Ability to identify factors that promote anxiety will improve 05/17/2022 0215 by Butler-Nicholson, Beni Turrell L, LPN Outcome: Progressing 05/17/2022 0214 by Butler-Nicholson, Zonya Gudger L, LPN Outcome: Progressing   Problem: Skin Integrity: Goal: Risk for impaired skin integrity will decrease 05/17/2022 0215 by Butler-Nicholson, Oren Barella L, LPN Outcome: Progressing 05/17/2022 0214 by Butler-Nicholson, Jadence Kinlaw L, LPN Outcome: Progressing   Problem: Safety: Goal: Ability to remain free from injury will improve 05/17/2022 0215 by Butler-Nicholson, Ishi Danser L, LPN Outcome: Progressing 05/17/2022 0214 by Butler-Nicholson, Delois Tolbert L, LPN Outcome: Progressing   Problem: Pain Managment: Goal: General experience of comfort will improve 05/17/2022 0215 by Butler-Nicholson, Eliceo Gladu L, LPN Outcome: Progressing 05/17/2022 0214 by Butler-Nicholson, Irais Mottram L, LPN Outcome: Progressing   Problem: Elimination: Goal: Will not experience complications related to urinary retention 05/17/2022 0215 by Butler-Nicholson, Shuree Brossart L, LPN Outcome: Progressing 05/17/2022 0214 by Butler-Nicholson, Tamilyn Lupien L,  LPN Outcome: Progressing   Problem: Coping: Goal: Level of anxiety will decrease 05/17/2022 0215 by Butler-Nicholson, Amalia Edgecombe L, LPN Outcome: Progressing 05/17/2022 0214 by Butler-Nicholson, Kerie Badger L, LPN Outcome: Progressing   Problem: Nutrition: Goal: Adequate nutrition will be maintained 05/17/2022 0215 by Butler-Nicholson, Haniyah Maciolek L, LPN Outcome: Progressing 05/17/2022 0214 by Butler-Nicholson, Alois Mincer L, LPN Outcome: Progressing

## 2022-05-17 NOTE — Plan of Care (Signed)
Patient appropriate with staff & peers. No aggressive behaviors noted. Patient states " happy ". Compliant with medications. Appetite and energy level good. ADLs maintained. Safety maintained with 1:1 sitter since 10 am. Support and encouragement given.

## 2022-05-17 NOTE — Progress Notes (Signed)
Insist on being called Adam [name] this evening.  Would not take his medication until I wrote [name] on the cup.  Afterward he received his qs meds without incident.  He denies si hi avh anxiety and pain.  Does endorse some depression due to missing his mother who he had just spoken to on the phone. Had a brief moment of frustration when he was not given a Gatorade with snack.  Seemed to understand when told that patients usually need an order from the doctor in order to get a Gatorade. Otherwise he has been pleasant and cooperative.  For now he remains with a 1:1 sitter for redirection and safety until 11pm. Will continue to monitor with q 15 minute safety checks.       C Butler-Nicholson, LPN

## 2022-05-18 NOTE — Progress Notes (Signed)
DAR NOTE: Patient presents with anxious affect and mood.  Denies suicidal thoughts, pain, auditory and visual hallucinations.  Described energy level as high with good concentration.  Rates depression at 0, hopelessness at 0, and anxiety at 3.  Maintained on routine safety checks.  Medications given as prescribed.  Support and encouragement offered as needed.  Attended group and participated.  States goal for today is "to get out of here."  Patient visible in the dayroom with minimal interaction.  Patient is safe on the unit. Offered no complaint.

## 2022-05-18 NOTE — Progress Notes (Signed)
[facility] MD Progress Note  05/18/2022 2:54 PM Adam [name]  MRN:  [id number] Subjective: No complaint.  No change to behavior.  No medical changes.  Doing quite well overall. 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 No pertinent surgical history. Family History: History reviewed. No pertinent family history. Family Psychiatric  History: See previous Social History:  Social History   Substance and Sexual Activity  Alcohol Use Not Currently     Social History   Substance and Sexual Activity  Drug Use Not Currently    Social History   Socioeconomic History   Marital status: Single    Spouse name: Not on file   Number of children: Not on file   Years of education: Not on file   Highest education level: Not on file  Occupational History   Not on file  Tobacco Use   Smoking status: Never    Passive exposure: Never   Smokeless tobacco: Never  Vaping Use   Vaping Use: Unknown  Substance and Sexual Activity   Alcohol use: Not Currently   Drug use: Not Currently   Sexual activity: Not Currently  Other Topics Concern   Not on file  Social History Narrative   Not on file   Social Determinants of Health   Financial Resource Strain: Not on file  Food Insecurity: Not on file  Transportation Needs: Not on file  Physical Activity: Not on file   Stress: Not on file  Social Connections: Not on file   Additional Social History:                         Sleep: Fair  Appetite:  Fair  Current Medications: Current Facility-Administered Medications  Medication Dose Route Frequency Provider Last Rate Last Admin   acetaminophen (TYLENOL) tablet 650 mg  650 mg Oral Q6H PRN Yasmine Kilbourne T, MD   650 mg at 04/26/22 0637   alum & mag hydroxide-simeth (MAALOX/MYLANTA) 200-200-20 MG/5ML suspension 30 mL  30 mL Oral Q4H PRN Moesha Sarchet T, MD   30 mL at 04/26/22 7619   aspirin EC tablet 81 mg  81 mg Oral Daily Zareen Jamison, [name], MD   81 mg at 05/18/22 5093   atorvastatin (LIPITOR) tablet 20 mg  20 mg Oral Daily Dossie Ocanas T, MD   20 mg at 05/18/22 0825   atropine 1 % ophthalmic solution 1 drop  1 drop Sublingual QID [name], MD   1 drop at 05/18/22 1151   benztropine (COGENTIN) tablet 0.5 mg  0.5 mg Oral BID Marianny Goris T, MD   0.5 mg at 05/18/22 0825   clonazePAM (KLONOPIN) tablet 1 mg  1 mg Oral TID PRN He, [name], MD   1 mg at 05/12/22 1622   cloZAPine (CLOZARIL) tablet 300 mg  300 mg Oral QHS Mariem Skolnick, [name], MD   300 mg at 05/17/22 2058  diphenhydrAMINE (BENADRYL) capsule 50 mg  50 mg Oral Q6H PRN Keeven Matty T, MD   50 mg at 05/12/22 1158   Or   diphenhydrAMINE (BENADRYL) injection 50 mg  50 mg Intramuscular Q6H PRN Amarie Tarte T, MD       gabapentin (NEURONTIN) capsule 300 mg  300 mg Oral TID Anaiya Wisinski T, MD   300 mg at 05/18/22 1150   haloperidol (HALDOL) tablet 5 mg  5 mg Oral TID Katherin Ramey T, MD   5 mg at 05/18/22 1150   haloperidol (HALDOL) tablet 5 mg  5 mg Oral Q6H PRN Hollie Wojahn T, MD   5 mg at 05/03/22 1813   Or   haloperidol lactate (HALDOL) injection 5 mg  5 mg Intramuscular Q6H PRN Christoffer Currier T, MD       magnesium hydroxide (MILK OF MAGNESIA) suspension 30 mL  30 mL Oral Daily PRN Izel Eisenhardt T, MD   30 mL at 05/02/22 [id number]   metoprolol succinate (TOPROL-XL) 24 hr tablet 25 mg  25 mg Oral  Daily Nonna Renninger T, MD   25 mg at 05/18/22 0827   paliperidone (INVEGA SUSTENNA) injection 156 mg  156 mg Intramuscular Q28 days Azara Gemme T, MD   156 mg at 04/26/22 1000   temazepam (RESTORIL) capsule 15 mg  15 mg Oral QHS Ahlayah Tarkowski T, MD   15 mg at 05/17/22 2059   ziprasidone (GEODON) injection 20 mg  20 mg Intramuscular Q12H PRN Cassidie Veiga, [name], MD   20 mg at 05/03/22 0103    Lab Results: No results found for this or any previous visit (from the past 27 hour(s)).  Blood Alcohol level:  Lab Results  Component Value Date   ETH <10 [id number]    Metabolic Disorder Labs: Lab Results  Component Value Date   HGBA1C 5.5 04/10/2022   MPG 111.15 04/10/2022   No results found for: "PROLACTIN" Lab Results  Component Value Date   CHOL 167 11/20/2021   TRIG 107 11/20/2021   HDL 26 (L) 11/20/2021   CHOLHDL 6.4 11/20/2021   VLDL 21 11/20/2021   LDLCALC 120 (H) 11/20/2021    Physical Findings: AIMS: Facial and Oral Movements Muscles of Facial Expression: None, normal Lips and Perioral Area: None, normal Jaw: None, normal Tongue: None, normal,Extremity Movements Upper (arms, wrists, hands, fingers): None, normal Lower (legs, knees, ankles, toes): None, normal, Trunk Movements Neck, shoulders, hips: None, normal, Overall Severity Severity of abnormal movements (highest score from questions above): None, normal Incapacitation due to abnormal movements: None, normal Patient's awareness of abnormal movements (rate only patient's report): No Awareness, Dental Status Current problems with teeth and/or dentures?: No Does patient usually wear dentures?: No  CIWA:    COWS:     Musculoskeletal: Strength & Muscle Tone: within normal limits Gait & Station: normal Patient leans: N/A  Psychiatric Specialty Exam:  Presentation  General Appearance: Appropriate for Environment; Casual; Neat; Well Groomed  Eye Contact:Fair  Speech:Slow  Speech  Volume:Decreased  Handedness:Left   Mood and Affect  Mood:Anxious  Affect:Congruent   Thought Process  Thought Processes:Goal Directed  Descriptions of Associations:Circumstantial  Orientation:Partial  Thought Content:Scattered  History of Schizophrenia/Schizoaffective disorder:Yes  Duration of Psychotic Symptoms:Greater than six months  Hallucinations:No data recorded Ideas of Reference:None  Suicidal Thoughts:No data recorded Homicidal Thoughts:No data recorded  Sensorium  Memory:Remote Good  Judgment:Fair  Insight:Fair   Executive Functions  Concentration:Fair  Attention Span:Fair  [place]  Language:Fair  Psychomotor Activity  Psychomotor Activity:No data recorded  Assets  Assets:Housing; Resilience; Social Support; Talents/Skills   Sleep  Sleep:No data recorded   Physical Exam: Physical Exam Vitals and nursing note reviewed.  Constitutional:      Appearance: Normal appearance.  HENT:     Head: Normocephalic and atraumatic.     Mouth/Throat:     Pharynx: Oropharynx is clear.  Eyes:     Pupils: Pupils are equal, round, and reactive to light.  Cardiovascular:     Rate and Rhythm: Normal rate and regular rhythm.  Pulmonary:     Effort: Pulmonary effort is normal.     Breath sounds: Normal breath sounds.  Abdominal:     General: Abdomen is flat.     Palpations: Abdomen is soft.  Musculoskeletal:        General: Normal range of motion.  Skin:    General: Skin is warm and dry.  Neurological:     General: No focal deficit present.     Mental Status: He is alert. Mental status is at baseline.  Psychiatric:        Attention and Perception: He is inattentive.        Mood and Affect: Affect is labile.        Speech: Speech normal.    Review of Systems  Unable to perform ROS: Language   Blood pressure 114/75, pulse 76, temperature (!) 97.5 F (36.4 C), temperature source Oral, resp. rate 16, height 5'  8" (1.727 m), weight 85.8 kg, SpO2 97 %. Body mass index is 28.76 kg/m.   Treatment Plan Summary: Plan continue current medication.  Next CBC in a couple days.  Continue working on discharge planning  Mordecai Rasmussen, MD 05/18/2022, 2:54 PM

## 2022-05-18 NOTE — Progress Notes (Signed)
Adam [name] has been active on the unit and enjoys hanging in the dayroom with the other residents watching tv and enjoying their company.  He is med compliant this evening and received his medication without incident. He denies si hi avh depression anxiety and pain at this encounter. Will continue to monitor with q15 safety checks.      C Butler-Nicholson, LPN

## 2022-05-19 NOTE — Progress Notes (Signed)
[facility] MD Progress Note  05/19/2022 11:00 PM Adam [name]  MRN:  [id number] Subjective: pt seen and chart reviewed. Adam [name] is doing well with sitter, a bit talkative, but talks nonsensically    stable overall.  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 No pertinent surgical history. Family History: History reviewed. No pertinent family history. Family Psychiatric  History: See previous Social History:  Social History   Substance and Sexual Activity  Alcohol Use Not Currently     Social History   Substance and Sexual Activity  Drug Use Not Currently    Social History   Socioeconomic History   Marital status: Single    Spouse name: Not on file   Number of children: Not on file   Years of education: Not on file   Highest education level: Not on file  Occupational History   Not on file  Tobacco Use   Smoking status: Never    Passive exposure: Never   Smokeless tobacco: Never  Vaping Use   Vaping Use: Unknown  Substance and Sexual Activity   Alcohol use: Not Currently   Drug use: Not Currently   Sexual activity: Not Currently  Other Topics Concern   Not on file  Social History Narrative   Not on file   Social Determinants of Health   Financial Resource Strain: Not on file  Food Insecurity: Not on file  Transportation Needs: Not on file   Physical Activity: Not on file  Stress: Not on file  Social Connections: Not on file   Additional Social History:    Sleep: Fair  Appetite:  Fair  Current Medications: Current Facility-Administered Medications  Medication Dose Route Frequency Provider Last Rate Last Admin   acetaminophen (TYLENOL) tablet 650 mg  650 mg Oral Q6H PRN [name], [name], MD   650 mg at 04/26/22 0637   alum & mag hydroxide-simeth (MAALOX/MYLANTA) 200-200-20 MG/5ML suspension 30 mL  30 mL Oral Q4H PRN [name], [name], MD   30 mL at 04/26/22 3536   aspirin EC tablet 81 mg  81 mg Oral Daily [name], [name], MD   81 mg at 05/19/22 1443   atorvastatin (LIPITOR) tablet 20 mg  20 mg Oral Daily [name], [name], MD   20 mg at 05/19/22 1540   atropine 1 % ophthalmic solution 1 drop  1 drop Sublingual QID [name], MD   1 drop at 05/19/22 2125   benztropine (COGENTIN) tablet 0.5 mg  0.5 mg Oral BID [name], [name], MD   0.5 mg at 05/19/22 1656   clonazePAM (KLONOPIN) tablet 1 mg  1 mg Oral TID PRN Vonette Grosso, MD   1 mg at 05/12/22 1622   cloZAPine (CLOZARIL) tablet 300 mg  300 mg Oral QHS [name], [name], MD   300 mg at 05/19/22 2124   diphenhydrAMINE (BENADRYL) capsule 50 mg  50 mg Oral Q6H PRN [name], John  T, MD   50 mg at 05/12/22 1158   Or   diphenhydrAMINE (BENADRYL) injection 50 mg  50 mg Intramuscular Q6H PRN [name], [name], MD       gabapentin (NEURONTIN) capsule 300 mg  300 mg Oral TID [name], [name], MD   300 mg at 05/19/22 1656   haloperidol (HALDOL) tablet 5 mg  5 mg Oral TID [name], [name], MD   5 mg at 05/19/22 1656   haloperidol (HALDOL) tablet 5 mg  5 mg Oral Q6H PRN [name], [name], MD   5 mg at 05/03/22 1813   Or   haloperidol lactate (HALDOL) injection 5 mg  5 mg Intramuscular Q6H PRN [name], [name], MD       magnesium hydroxide (MILK OF MAGNESIA) suspension 30 mL  30 mL Oral Daily PRN [name], [name], MD   30 mL at 05/02/22 2585   metoprolol succinate (TOPROL-XL) 24 hr tablet 25 mg  25 mg  Oral Daily [name], [name], MD   25 mg at 05/19/22 2778   paliperidone (INVEGA SUSTENNA) injection 156 mg  156 mg Intramuscular Q28 days [name], [name], MD   156 mg at 04/26/22 1000   temazepam (RESTORIL) capsule 15 mg  15 mg Oral QHS [name], [name], MD   15 mg at 05/19/22 2124   ziprasidone (GEODON) injection 20 mg  20 mg Intramuscular Q12H PRN [name], [name], MD   20 mg at 05/03/22 0103    Lab Results: No results found for this or any previous visit (from the past 48 hour(s)).  Blood Alcohol level:  Lab Results  Component Value Date   ETH <10 07/20/2021    Metabolic Disorder Labs: Lab Results  Component Value Date   HGBA1C 5.5 04/10/2022   MPG 111.15 04/10/2022   No results found for: "PROLACTIN" Lab Results  Component Value Date   CHOL 167 11/20/2021   TRIG 107 11/20/2021   HDL 26 (L) 11/20/2021   CHOLHDL 6.4 11/20/2021   VLDL 21 11/20/2021   LDLCALC 120 (H) 11/20/2021    Physical Findings: AIMS: Facial and Oral Movements Muscles of Facial Expression: None, normal Lips and Perioral Area: None, normal Jaw: None, normal Tongue: None, normal,Extremity Movements Upper (arms, wrists, hands, fingers): None, normal Lower (legs, knees, ankles, toes): None, normal, Trunk Movements Neck, shoulders, hips: None, normal, Overall Severity Severity of abnormal movements (highest score from questions above): None, normal Incapacitation due to abnormal movements: None, normal Patient's awareness of abnormal movements (rate only patient's report): No Awareness, Dental Status Current problems with teeth and/or dentures?: No Does patient usually wear dentures?: No  CIWA:    COWS:     Musculoskeletal: Strength & Muscle Tone: within normal limits Gait & Station: normal Patient leans: N/A  Psychiatric Specialty Exam:  Presentation  General Appearance: Appropriate for Environment; Casual; Neat; Well Groomed  Eye Contact:Fair  Speech:Slow  Speech  Volume:Decreased  Handedness:Left   Mood and Affect  Mood:Anxious  Affect:Congruent   Thought Process  Thought Processes:Goal Directed  Descriptions of Associations:Circumstantial  Orientation:Partial  Thought Content:Scattered  History of Schizophrenia/Schizoaffective disorder:Yes  Duration of Psychotic Symptoms:Greater than six months  Hallucinations:No data recorded Ideas of Reference:None  Suicidal Thoughts:No data recorded Homicidal Thoughts:No data recorded  Sensorium  Memory:Remote Good  Judgment:Fair  Insight:Fair   Executive Functions  Concentration:Fair  Attention Span:Fair  Recall:Fair  Fund of Knowledge:Fair  Language:Fair   Psychomotor Activity  Psychomotor Activity:No data recorded  Assets  Assets:Housing; Resilience; Social  Support; Talents/Skills   Sleep  Sleep:No data recorded   Physical Exam: Physical Exam Vitals and nursing note reviewed.  Constitutional:      Appearance: Normal appearance.  HENT:     Head: Normocephalic and atraumatic.     Mouth/Throat:     Pharynx: Oropharynx is clear.  Eyes:     Pupils: Pupils are equal, round, and reactive to light.  Cardiovascular:     Rate and Rhythm: Normal rate and regular rhythm.  Pulmonary:     Effort: Pulmonary effort is normal.     Breath sounds: Normal breath sounds.  Abdominal:     General: Abdomen is flat.     Palpations: Abdomen is soft.  Musculoskeletal:        General: Normal range of motion.  Skin:    General: Skin is warm and dry.  Neurological:     General: No focal deficit present.     Mental Status: Colman Birdwell is alert. Mental status is at baseline.  Psychiatric:        Attention and Perception: Rileyann Florance is inattentive.        Mood and Affect: Affect is labile.        Speech: Speech normal.    Review of Systems  Unable to perform ROS: Language   Blood pressure 106/81, pulse 99, temperature 98.8 F (37.1 C), temperature source Oral, resp. rate 20, height 5\' 8"   (1.727 m), weight 85.8 kg, SpO2 99 %. Body mass index is 28.76 kg/m.   Treatment Plan Summary: Plan continue current medication.  Next CBC in a couple days.  Continue working on discharge planning  Ismahan Lippman, MD 05/19/2022, 11:00 PM

## 2022-05-19 NOTE — Plan of Care (Signed)
Pt visible on the unit, interacting with staff, but not peers. He denies SI/HI and AVH. No anxiety or depression observed.  Pt has expressive aphasia, he can says some words and phases. He understands what people are saying. Sitter with pt to assess as needed. Pt likes to listen to music, and plays with his electronics. His pleasant, cooperative and he takes his medications.  Problem: Education: Goal: Ability to state activities that reduce stress will improve Outcome: Progressing   Problem: Coping: Goal: Ability to identify and develop effective coping behavior will improve Outcome: Progressing   Problem: Self-Concept: Goal: Ability to identify factors that promote anxiety will improve Outcome: Progressing Goal: Level of anxiety will decrease Outcome: Progressing Goal: Ability to modify response to factors that promote anxiety will improve Outcome: Progressing   Problem: Education: Goal: Knowledge of General Education information will improve Description: Including pain rating scale, medication(s)/side effects and non-pharmacologic comfort measures Outcome: Progressing   Problem: Health Behavior/Discharge Planning: Goal: Ability to manage health-related needs will improve Outcome: Progressing   Problem: Clinical Measurements: Goal: Ability to maintain clinical measurements within normal limits will improve Outcome: Progressing Goal: Will remain free from infection Outcome: Progressing Goal: Diagnostic test results will improve Outcome: Progressing Goal: Respiratory complications will improve Outcome: Progressing Goal: Cardiovascular complication will be avoided Outcome: Progressing   Problem: Activity: Goal: Risk for activity intolerance will decrease Outcome: Progressing   Problem: Nutrition: Goal: Adequate nutrition will be maintained Outcome: Progressing   Problem: Coping: Goal: Level of anxiety will decrease Outcome: Progressing   Problem: Elimination: Goal:  Will not experience complications related to bowel motility Outcome: Progressing Goal: Will not experience complications related to urinary retention Outcome: Progressing   Problem: Pain Managment: Goal: General experience of comfort will improve Outcome: Progressing   Problem: Safety: Goal: Ability to remain free from injury will improve Outcome: Progressing   Problem: Skin Integrity: Goal: Risk for impaired skin integrity will decrease Outcome: Progressing

## 2022-05-19 NOTE — Plan of Care (Signed)
  Problem: Education: Goal: Ability to state activities that reduce stress will improve Outcome: Progressing   Problem: Coping: Goal: Ability to identify and develop effective coping behavior will improve Outcome: Progressing   Problem: Self-Concept: Goal: Ability to identify factors that promote anxiety will improve Outcome: Progressing Goal: Level of anxiety will decrease Outcome: Progressing Goal: Ability to modify response to factors that promote anxiety will improve Outcome: Progressing   Problem: Skin Integrity: Goal: Risk for impaired skin integrity will decrease Outcome: Progressing   Problem: Safety: Goal: Ability to remain free from injury will improve Outcome: Progressing   Problem: Pain Managment: Goal: General experience of comfort will improve Outcome: Progressing   Problem: Elimination: Goal: Will not experience complications related to bowel motility Outcome: Progressing Goal: Will not experience complications related to urinary retention Outcome: Progressing   

## 2022-05-19 NOTE — Plan of Care (Signed)
  Problem: Education: Goal: Ability to state activities that reduce stress will improve Outcome: Progressing   Problem: Coping: Goal: Ability to identify and develop effective coping behavior will improve Outcome: Progressing   Problem: Self-Concept: Goal: Ability to identify factors that promote anxiety will improve Outcome: Progressing Goal: Level of anxiety will decrease Outcome: Progressing Goal: Ability to modify response to factors that promote anxiety will improve Outcome: Progressing   Problem: Education: Goal: Knowledge of General Education information will improve Description: Including pain rating scale, medication(s)/side effects and non-pharmacologic comfort measures Outcome: Progressing   Problem: Skin Integrity: Goal: Risk for impaired skin integrity will decrease Outcome: Progressing   Problem: Safety: Goal: Ability to remain free from injury will improve Outcome: Progressing   Problem: Pain Managment: Goal: General experience of comfort will improve Outcome: Progressing   Problem: Elimination: Goal: Will not experience complications related to bowel motility Outcome: Progressing Goal: Will not experience complications related to urinary retention Outcome: Progressing   

## 2022-05-19 NOTE — Progress Notes (Signed)
Patient has been active on the unit. He has been in the dayroom enjoying the company of the other peers.  He has been in a labile mood and has had frequent confrontation with another patient on the unit.  He has had to be redirected and at times kept in the back hall to keep them separated.  He is med complaint and received his medication without incident.  When asked Adam [name] denied having any hi si avh depression or anxiety. Will continue to monitor with q 15 minute safety checks. For no he remains with a sitter on 1:1 until 11pm.      C Butler-Nicholson, LPN

## 2022-05-20 NOTE — Plan of Care (Signed)
Met with patient in day room. Pt is appropriate to situations. Denies any acute issues at this time. No signs of distress or injury. PT is adherent with scheduled medications. Staff is 1:1 for safety.    Problem: Education: Goal: Ability to state activities that reduce stress will improve Outcome: Not Progressing   Problem: Coping: Goal: Ability to identify and develop effective coping behavior will improve Outcome: Not Progressing   Problem: Self-Concept: Goal: Ability to identify factors that promote anxiety will improve Outcome: Not Progressing Goal: Level of anxiety will decrease Outcome: Not Progressing

## 2022-05-20 NOTE — Progress Notes (Signed)
Pt calm and pleasant during assessment denying SI/HI/AVH. Pt endorses pain stating his right knee was hurting because he played basketball today. Pt given PRN medication, check MAR. Pt observed interacting appropriately with staff and peers on the unit. Pt given education, support, and encouragement to be active in his treatment plan. Pt being monitored Q 15 minutes for safety per unit protocol. Pt remains safe on the unit.

## 2022-05-20 NOTE — Group Note (Signed)
LCSW Group Therapy Note   Group Date: 05/20/2022 Start Time: 0945 End Time: 1030    Type of Therapy and Topic:  Group Therapy:  Strengths Exploration   Participation Level: Did Not Attend  Description of Group: This group allows individuals to explore their strengths, learn to use strengths in new ways to improve well-being. Strengths-based interventions involve identifying strengths, understanding how they are used, and learning new ways to apply them. Individuals will identify their strengths, and then explore their roles in different areas of life (relationships, professional life, and personal fulfillment). Individuals will think about ways in which they currently use their strengths, along with new ways they could begin using them.    Therapeutic Goals Patient will verbalize two of their strengths Patient will identify how their strengths are currently used Patient will identify two new ways to apply their strengths  Patients will create a plan to apply their strengths in their daily lives     Summary of Patient Progress:  Did not attend       Therapeutic Modalities Cognitive Behavioral Therapy Motivational Interviewing    Madhuri Vacca MSW, LCSW Clincal Social Worker  [facility]  

## 2022-05-20 NOTE — Progress Notes (Signed)
[facility] MD Progress Note  05/20/2022 1:22 PM Thomson Macfadyen  MRN:  [id number] Subjective: pt seen and chart reviewed. Enora Trillo is doing well with sitter, a bit talkative, but talks nonsensically, and repeated stated "[employer]", which might be where Verline Kong used to live.    stable overall.  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 No pertinent surgical history. Family History: History reviewed. No pertinent family history. Family Psychiatric  History: See previous Social History:  Social History   Substance and Sexual Activity  Alcohol Use Not Currently     Social History   Substance and Sexual Activity  Drug Use Not Currently    Social History   Socioeconomic History   Marital status: Single    Spouse name: Not on file   Number of children: Not on file   Years of education: Not on file   Highest education level: Not on file  Occupational History   Not on file  Tobacco Use   Smoking status: Never    Passive exposure: Never   Smokeless tobacco: Never  Vaping Use   Vaping Use: Unknown  Substance and Sexual Activity   Alcohol use: Not Currently   Drug use: Not Currently   Sexual activity: Not Currently  Other Topics Concern   Not on file  Social History Narrative   Not on file   Social Determinants of Health   Financial Resource Strain: Not on  file  Food Insecurity: Not on file  Transportation Needs: Not on file  Physical Activity: Not on file  Stress: Not on file  Social Connections: Not on file   Additional Social History:    Sleep: Fair  Appetite:  Fair  Current Medications: Current Facility-Administered Medications  Medication Dose Route Frequency Provider Last Rate Last Admin   acetaminophen (TYLENOL) tablet 650 mg  650 mg Oral Q6H PRN [name], [name], MD   650 mg at 04/26/22 0637   alum & mag hydroxide-simeth (MAALOX/MYLANTA) 200-200-20 MG/5ML suspension 30 mL  30 mL Oral Q4H PRN [name], [name], MD   30 mL at 04/26/22 0092   aspirin EC tablet 81 mg  81 mg Oral Daily [name], [name], MD   81 mg at 05/20/22 3300   atorvastatin (LIPITOR) tablet 20 mg  20 mg Oral Daily [name], [name], MD   20 mg at 05/20/22 0828   atropine 1 % ophthalmic solution 1 drop  1 drop Sublingual QID [name], MD   1 drop at 05/20/22 1205   benztropine (COGENTIN) tablet 0.5 mg  0.5 mg Oral BID [name], [name], MD   0.5 mg at 05/20/22 0827   clonazePAM (KLONOPIN) tablet 1 mg  1 mg Oral TID PRN Tamarion Haymond, MD   1 mg at 05/12/22 1622   cloZAPine (CLOZARIL) tablet 300 mg  300 mg Oral QHS [name], [name], MD   300 mg at 05/19/22 2124  diphenhydrAMINE (BENADRYL) capsule 50 mg  50 mg Oral Q6H PRN [name], [name], MD   50 mg at 05/12/22 1158   Or   diphenhydrAMINE (BENADRYL) injection 50 mg  50 mg Intramuscular Q6H PRN [name], [name], MD       gabapentin (NEURONTIN) capsule 300 mg  300 mg Oral TID [name], [name], MD   300 mg at 05/20/22 1205   haloperidol (HALDOL) tablet 5 mg  5 mg Oral TID [name], [name], MD   5 mg at 05/20/22 1205   haloperidol (HALDOL) tablet 5 mg  5 mg Oral Q6H PRN [name], [name], MD   5 mg at 05/03/22 1813   Or   haloperidol lactate (HALDOL) injection 5 mg  5 mg Intramuscular Q6H PRN [name], [name], MD       magnesium hydroxide (MILK OF MAGNESIA) suspension 30 mL  30 mL Oral Daily PRN [name], [name], MD   30 mL at  05/02/22 3383   metoprolol succinate (TOPROL-XL) 24 hr tablet 25 mg  25 mg Oral Daily [name], [name], MD   25 mg at 05/20/22 2919   paliperidone (INVEGA SUSTENNA) injection 156 mg  156 mg Intramuscular Q28 days [name], [name], MD   156 mg at 04/26/22 1000   temazepam (RESTORIL) capsule 15 mg  15 mg Oral QHS [name], [name], MD   15 mg at 05/19/22 2124   ziprasidone (GEODON) injection 20 mg  20 mg Intramuscular Q12H PRN [name], [name], MD   20 mg at 05/03/22 0103    Lab Results: No results found for this or any previous visit (from the past 48 hour(s)).  Blood Alcohol level:  Lab Results  Component Value Date   ETH <10 07/20/2021    Metabolic Disorder Labs: Lab Results  Component Value Date   HGBA1C 5.5 04/10/2022   MPG 111.15 04/10/2022   No results found for: "PROLACTIN" Lab Results  Component Value Date   CHOL 167 11/20/2021   TRIG 107 11/20/2021   HDL 26 (L) 11/20/2021   CHOLHDL 6.4 11/20/2021   VLDL 21 11/20/2021   LDLCALC 120 (H) 11/20/2021    Physical Findings: AIMS: Facial and Oral Movements Muscles of Facial Expression: None, normal Lips and Perioral Area: None, normal Jaw: None, normal Tongue: None, normal,Extremity Movements Upper (arms, wrists, hands, fingers): None, normal Lower (legs, knees, ankles, toes): None, normal, Trunk Movements Neck, shoulders, hips: None, normal, Overall Severity Severity of abnormal movements (highest score from questions above): None, normal Incapacitation due to abnormal movements: None, normal Patient's awareness of abnormal movements (rate only patient's report): No Awareness, Dental Status Current problems with teeth and/or dentures?: No Does patient usually wear dentures?: No  CIWA:    COWS:     Musculoskeletal: Strength & Muscle Tone: within normal limits Gait & Station: normal Patient leans: N/A  Psychiatric Specialty Exam:  Presentation  General Appearance: Appropriate for Environment; Casual; Neat; Well  Groomed  Eye Contact:Fair  Speech:Slow  Speech Volume:Decreased  Handedness:Left   Mood and Affect  Mood:Anxious  Affect:Congruent   Thought Process  Thought Processes:Goal Directed  Descriptions of Associations:Circumstantial  Orientation:Partial  Thought Content:Scattered  History of Schizophrenia/Schizoaffective disorder:Yes  Duration of Psychotic Symptoms:Greater than six months  Hallucinations:No data recorded Ideas of Reference:None  Suicidal Thoughts:No data recorded Homicidal Thoughts:No data recorded  Sensorium  Memory:Remote Good  Judgment:Fair  Insight:Fair   Executive Functions  Concentration:Fair  Attention Span:Fair  Recall:Fair  Fund of Knowledge:Fair  Language:Fair  Psychomotor Activity  Psychomotor Activity:No data recorded  Assets  Assets:Housing; Resilience; Social Support; Talents/Skills   Sleep  Sleep:No data recorded   Physical Exam: Physical Exam Vitals and nursing note reviewed.  Constitutional:      Appearance: Normal appearance.  HENT:     Head: Normocephalic and atraumatic.     Mouth/Throat:     Pharynx: Oropharynx is clear.  Eyes:     Pupils: Pupils are equal, round, and reactive to light.  Cardiovascular:     Rate and Rhythm: Normal rate and regular rhythm.  Pulmonary:     Effort: Pulmonary effort is normal.     Breath sounds: Normal breath sounds.  Abdominal:     General: Abdomen is flat.     Palpations: Abdomen is soft.  Musculoskeletal:        General: Normal range of motion.  Skin:    General: Skin is warm and dry.  Neurological:     General: No focal deficit present.     Mental Status: Lawson Isabell is alert. Mental status is at baseline.  Psychiatric:        Attention and Perception: Shahd Occhipinti is inattentive.        Mood and Affect: Affect is labile.        Speech: Speech normal.    Review of Systems  Unable to perform ROS: Language   Blood pressure 119/89, pulse 95, temperature 97.8 F (36.6 C),  temperature source Oral, resp. rate 20, height 5\' 8"  (1.727 m), weight 85.8 kg, SpO2 100 %. Body mass index is 28.76 kg/m.   Treatment Plan Summary: Plan continue current medication.  Next CBC in a couple days.  Continue working on discharge planning  Shemica Meath, MD 05/20/2022, 1:22 PM

## 2022-05-21 LAB — CBC WITH DIFFERENTIAL/PLATELET
Abs Immature Granulocytes: 0.01 10*3/uL (ref 0.00–0.07)
Basophils Absolute: 0 10*3/uL (ref 0.0–0.1)
Basophils Relative: 1 %
Eosinophils Absolute: 0 10*3/uL (ref 0.0–0.5)
Eosinophils Relative: 0 %
HCT: 42.4 % (ref 39.0–52.0)
Hemoglobin: 14.1 g/dL (ref 13.0–17.0)
Immature Granulocytes: 0 %
Lymphocytes Relative: 39 %
Lymphs Abs: 1.2 10*3/uL (ref 0.7–4.0)
MCH: 29.1 pg (ref 26.0–34.0)
MCHC: 33.3 g/dL (ref 30.0–36.0)
MCV: 87.6 fL (ref 80.0–100.0)
Monocytes Absolute: 0.2 10*3/uL (ref 0.1–1.0)
Monocytes Relative: 7 %
Neutro Abs: 1.6 10*3/uL — ABNORMAL LOW (ref 1.7–7.7)
Neutrophils Relative %: 53 %
Platelets: 156 10*3/uL (ref 150–400)
RBC: 4.84 MIL/uL (ref 4.22–5.81)
RDW: 13.1 % (ref 11.5–15.5)
WBC: 3 10*3/uL — ABNORMAL LOW (ref 4.0–10.5)
nRBC: 0 % (ref 0.0–0.2)

## 2022-05-21 NOTE — Plan of Care (Signed)
See nursing notes. Problem: Education: Goal: Ability to state activities that reduce stress will improve Outcome: Progressing   Problem: Coping: Goal: Ability to identify and develop effective coping behavior will improve Outcome: Progressing   Problem: Self-Concept: Goal: Ability to identify factors that promote anxiety will improve Outcome: Progressing Goal: Level of anxiety will decrease Outcome: Progressing Goal: Ability to modify response to factors that promote anxiety will improve Outcome: Progressing   Problem: Education: Goal: Knowledge of General Education information will improve Description: Including pain rating scale, medication(s)/side effects and non-pharmacologic comfort measures Outcome: Progressing   Problem: Health Behavior/Discharge Planning: Goal: Ability to manage health-related needs will improve Outcome: Progressing   Problem: Clinical Measurements: Goal: Ability to maintain clinical measurements within normal limits will improve Outcome: Progressing Goal: Will remain free from infection Outcome: Progressing Goal: Diagnostic test results will improve Outcome: Progressing Goal: Respiratory complications will improve Outcome: Progressing Goal: Cardiovascular complication will be avoided Outcome: Progressing   Problem: Activity: Goal: Risk for activity intolerance will decrease Outcome: Progressing   Problem: Nutrition: Goal: Adequate nutrition will be maintained Outcome: Progressing   Problem: Coping: Goal: Level of anxiety will decrease Outcome: Progressing   Problem: Elimination: Goal: Will not experience complications related to bowel motility Outcome: Progressing Goal: Will not experience complications related to urinary retention Outcome: Progressing   Problem: Pain Managment: Goal: General experience of comfort will improve Outcome: Progressing   Problem: Safety: Goal: Ability to remain free from injury will improve Outcome:  Progressing   Problem: Skin Integrity: Goal: Risk for impaired skin integrity will decrease Outcome: Progressing

## 2022-05-21 NOTE — Progress Notes (Signed)
Pt up for breakfast, visible on the unit and he interacts with staff. He nodded no to SI/HI and [facility]. He denies having any anxiety or depression. Pt has difficulty communicating due to expressive aphasia, past stoke and TBI. Pt can process what staff is saying. Am vital signs reported to doctor. No complaints voiced.

## 2022-05-21 NOTE — Progress Notes (Signed)
Pharmacy Consult - Clozapine     61 yo male ordered clozapine 300 mg PO qHS    Clozapine REMS enrollment Verified: yes Clozapine REMS enrollment: 01/04/22  REMS patient ID: [id number]     Dose Adjustments This Admission:   Decrease dose from 375mg  to 300mg  on  4/10     Dose Adjustments This Admission: 2/3 started on clozapine 25mg   2/10 clozapine 100 mg  2/20 clozapine 175 mg 2/22 clozapine 200 mg  3/06 clozapine 250 mg 3/08 clozapine 300 mg 3/17 clozapine 350 mg 4/10 clozapine 300 mg   Plan: clozapine 300mg  qHS as previously ordered by Dr 5/06 ANC 6/19 = 1600 (reported to REMS 05/21/2022), will adjust ANC monitoring back to once weekly    **CLOZAPINE MONITORING** (reflects NEW REMS GUIDELINES EFFECTIVE 09/13/2014): Check ANC at least weekly while inpatient.   For general population patients, i.e., those without benign ethnic neutropenia (BEN): --If ANC 1000-1499, increase ANC monitoring to 3x/wk --If ANC < 1000, HOLD CLOZAPINE and get psych consult

## 2022-05-21 NOTE — Progress Notes (Signed)
Recreation Therapy Notes   Date: 05/21/2022  Time: 10:40 am    Location: Craft room    Behavioral response: N/A   Intervention Topic: Goals    Discussion/Intervention: Patient refused to attend group.   Clinical Observations/Feedback:  Patient refused to attend group.    Zakhia Seres LRT/CTRS        Bernarda Erck 05/21/2022 1:34 PM

## 2022-05-21 NOTE — Progress Notes (Signed)
Pharmacy Consult - Clozapine     61 yo male ordered clozapine 300 mg PO qHS    Clozapine REMS enrollment Verified: yes Clozapine REMS enrollment: 01/04/22  REMS patient ID: [id number]     Dose Adjustments This Admission:   Decrease dose from 375mg  to 300mg  on  4/10     Dose Adjustments This Admission: 2/3 started on clozapine 25mg   2/10 clozapine 100 mg  2/20 clozapine 175 mg 2/22 clozapine 200 mg  3/06 clozapine 250 mg 3/08 clozapine 300 mg 3/17 clozapine 350 mg 4/10 clozapine 300 mg   Plan: Continue clozapine 300mg  qHS as previously ordered by Dr 5/06 ANC 6/19 = 1600 (reported to REMS 05/21/2022), will adjust ANC monitoring back to once weekly. Still in normal range, but just above ULN. Will CTM closely at next interval in case dose modification or more frequent monitoring is warranted.    **CLOZAPINE MONITORING** (reflects NEW REMS GUIDELINES EFFECTIVE 09/13/2014): Check ANC at least weekly while inpatient.   For general population patients, i.e., those without benign ethnic neutropenia (BEN): --If ANC 1000-1499, increase ANC monitoring to 3x/wk --If ANC < 1000, HOLD CLOZAPINE and get psych consult

## 2022-05-21 NOTE — Progress Notes (Signed)
Patient calm and pleasant. Observed interacting appropriately with staff and peers on the unit. Pt compliant with medication administration per MD orders. Pt given support and encouragement. Pt being monitored Q 15 minutes for safety per unit protocol. Pt remains safe on the unit.

## 2022-05-21 NOTE — Progress Notes (Signed)
[facility] MD Progress Note  05/21/2022 1:14 PM Adam [name]  MRN:  [id number] Subjective: Follow-up for this patient with schizophrenia and major stroke.  No new complaints.  Behavior a little more irritable but has not assaulted anyone and has not fundamentally changed presentation.  Neutrophil count today down to 1600 again.  A little concerning.  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 No pertinent surgical history. Family History: History reviewed. No pertinent family history. Family Psychiatric  History: See previous Social History:  Social History   Substance and Sexual Activity  Alcohol Use Not Currently     Social History   Substance and Sexual Activity  Drug Use Not Currently    Social History   Socioeconomic History   Marital status: Single    Spouse name: Not on file   Number of children: Not on file   Years of education: Not on file   Highest education level: Not on file  Occupational History   Not on file  Tobacco Use   Smoking status: Never    Passive exposure: Never   Smokeless tobacco: Never  Vaping Use   Vaping Use: Unknown  Substance and Sexual Activity   Alcohol use: Not Currently   Drug use: Not Currently   Sexual activity: Not Currently  Other Topics Concern   Not on file  Social  History Narrative   Not on file   Social Determinants of Health   Financial Resource Strain: Not on file  Food Insecurity: Not on file  Transportation Needs: Not on file  Physical Activity: Not on file  Stress: Not on file  Social Connections: Not on file   Additional Social History:  Specify valuables returned:  (none)                      Sleep: Fair  Appetite:  Fair  Current Medications: Current Facility-Administered Medications  Medication Dose Route Frequency Provider Last Rate Last Admin   acetaminophen (TYLENOL) tablet 650 mg  650 mg Oral Q6H PRN Ming Mcmannis T, MD   650 mg at 04/26/22 0637   alum & mag hydroxide-simeth (MAALOX/MYLANTA) 200-200-20 MG/5ML suspension 30 mL  30 mL Oral Q4H PRN Tracye Szuch T, MD   30 mL at 04/26/22 7035   aspirin EC tablet 81 mg  81 mg Oral Daily Trust Crago, [name], MD   81 mg at 05/21/22 0824   atorvastatin (LIPITOR) tablet 20 mg  20 mg Oral Daily Devaris Quirk T, MD   20 mg at 05/21/22 0824   atropine 1 % ophthalmic solution 1 drop  1 drop Sublingual QID [name], MD   1 drop at 05/21/22 1149   benztropine (COGENTIN) tablet 0.5 mg  0.5 mg Oral BID Heidie Krall T, MD   0.5 mg at 05/21/22 0824   clonazePAM (KLONOPIN)  tablet 1 mg  1 mg Oral TID PRN He, [name], MD   1 mg at 05/12/22 1622   cloZAPine (CLOZARIL) tablet 300 mg  300 mg Oral QHS Jonquil Stubbe T, MD   300 mg at 05/20/22 2101   diphenhydrAMINE (BENADRYL) capsule 50 mg  50 mg Oral Q6H PRN Kimberle Stanfill, [name], MD   50 mg at 05/12/22 1158   Or   diphenhydrAMINE (BENADRYL) injection 50 mg  50 mg Intramuscular Q6H PRN Davelyn Gwinn, [name], MD       gabapentin (NEURONTIN) capsule 300 mg  300 mg Oral TID Zyquan Crotty T, MD   300 mg at 05/21/22 1150   haloperidol (HALDOL) tablet 5 mg  5 mg Oral TID Shaunn Tackitt, [name], MD   5 mg at 05/21/22 1149   haloperidol (HALDOL) tablet 5 mg  5 mg Oral Q6H PRN Manilla Strieter, [name], MD   5 mg at 05/03/22 1813   Or   haloperidol lactate (HALDOL) injection 5 mg   5 mg Intramuscular Q6H PRN Sharece Fleischhacker, [name], MD       magnesium hydroxide (MILK OF MAGNESIA) suspension 30 mL  30 mL Oral Daily PRN Nyasia Baxley T, MD   30 mL at 05/02/22 4098   metoprolol succinate (TOPROL-XL) 24 hr tablet 25 mg  25 mg Oral Daily Arlis Everly T, MD   25 mg at 05/21/22 0824   paliperidone (INVEGA SUSTENNA) injection 156 mg  156 mg Intramuscular Q28 days Jabes Primo T, MD   156 mg at 04/26/22 1000   temazepam (RESTORIL) capsule 15 mg  15 mg Oral QHS Aquanetta Schwarz T, MD   15 mg at 05/20/22 2101   ziprasidone (GEODON) injection 20 mg  20 mg Intramuscular Q12H PRN Naiyana Barbian, [name], MD   20 mg at 05/03/22 0103    Lab Results:  Results for orders placed or performed during the hospital encounter of 03/19/22 (from the past 48 hour(s))  CBC with Differential/Platelet     Status: Abnormal   Collection Time: 05/21/22  9:09 AM  Result Value Ref Range   WBC 3.0 (L) 4.0 - 10.5 K/uL   RBC 4.84 4.22 - 5.81 MIL/uL   Hemoglobin 14.1 13.0 - 17.0 g/dL   HCT 11.9 14.7 - 82.9 %   MCV 87.6 80.0 - 100.0 fL   MCH 29.1 26.0 - 34.0 pg   MCHC 33.3 30.0 - 36.0 g/dL   RDW 56.2 13.0 - 86.5 %   Platelets 156 150 - 400 K/uL   nRBC 0.0 0.0 - 0.2 %   Neutrophils Relative % 53 %   Neutro Abs 1.6 (L) 1.7 - 7.7 K/uL   Lymphocytes Relative 39 %   Lymphs Abs 1.2 0.7 - 4.0 K/uL   Monocytes Relative 7 %   Monocytes Absolute 0.2 0.1 - 1.0 K/uL   Eosinophils Relative 0 %   Eosinophils Absolute 0.0 0.0 - 0.5 K/uL   Basophils Relative 1 %   Basophils Absolute 0.0 0.0 - 0.1 K/uL   Immature Granulocytes 0 %   Abs Immature Granulocytes 0.01 0.00 - 0.07 K/uL    Comment: Performed at [facility], [address] [place], [place] [address]    Blood Alcohol level:  Lab Results  Component Value Date   [facility] <10 07/20/2021    Metabolic Disorder Labs: Lab Results  Component Value Date   HGBA1C 5.5 04/10/2022   MPG 111.15 04/10/2022   No results found for: "PROLACTIN" Lab Results  Component  Value  Date   CHOL 167 11/20/2021   TRIG 107 11/20/2021   HDL 26 (L) 11/20/2021   CHOLHDL 6.4 11/20/2021   VLDL 21 11/20/2021   LDLCALC 120 (H) 11/20/2021    Physical Findings: AIMS: Facial and Oral Movements Muscles of Facial Expression: None, normal Lips and Perioral Area: None, normal Jaw: None, normal Tongue: None, normal,Extremity Movements Upper (arms, wrists, hands, fingers): None, normal Lower (legs, knees, ankles, toes): None, normal, Trunk Movements Neck, shoulders, hips: None, normal, Overall Severity Severity of abnormal movements (highest score from questions above): None, normal Incapacitation due to abnormal movements: None, normal Patient's awareness of abnormal movements (rate only patient's report): No Awareness, Dental Status Current problems with teeth and/or dentures?: No Does patient usually wear dentures?: No  CIWA:    COWS:     Musculoskeletal: Strength & Muscle Tone: within normal limits Gait & Station: normal Patient leans: N/A  Psychiatric Specialty Exam:  Presentation  General Appearance: Appropriate for Environment; Casual; Neat; Well Groomed  Eye Contact:Fair  Speech:Slow  Speech Volume:Decreased  Handedness:Left   Mood and Affect  Mood:Anxious  Affect:Congruent   Thought Process  Thought Processes:Goal Directed  Descriptions of Associations:Circumstantial  Orientation:Partial  Thought Content:Scattered  History of Schizophrenia/Schizoaffective disorder:Yes  Duration of Psychotic Symptoms:Greater than six months  Hallucinations:No data recorded Ideas of Reference:None  Suicidal Thoughts:No data recorded Homicidal Thoughts:No data recorded  Sensorium  Memory:Remote Good  Judgment:Fair  Insight:Fair   Executive Functions  Concentration:Fair  Attention Span:Fair  Recall:Fair  Fund of Knowledge:Fair  Language:Fair   Psychomotor Activity  Psychomotor Activity:No data recorded  Assets  Assets:Housing;  Resilience; Social Support; Talents/Skills   Sleep  Sleep:No data recorded   Physical Exam: Physical Exam Vitals and nursing note reviewed.  Constitutional:      Appearance: Normal appearance.  HENT:     Head: Normocephalic and atraumatic.     Mouth/Throat:     Pharynx: Oropharynx is clear.  Eyes:     Pupils: Pupils are equal, round, and reactive to light.  Cardiovascular:     Rate and Rhythm: Normal rate and regular rhythm.  Pulmonary:     Effort: Pulmonary effort is normal.     Breath sounds: Normal breath sounds.  Abdominal:     General: Abdomen is flat.     Palpations: Abdomen is soft.  Musculoskeletal:        General: Normal range of motion.  Skin:    General: Skin is warm and dry.  Neurological:     General: No focal deficit present.     Mental Status: He is alert. Mental status is at baseline.  Psychiatric:        Attention and Perception: He is inattentive.        Mood and Affect: Affect is inappropriate.        Speech: He is noncommunicative.    Review of Systems  Unable to perform ROS: Language  Patient seen for follow-up.  No complaints of course.  Nursing reports he has been a little more agitated over the weekend and possibly the result of no longer having one-to-one supervision.  No violence however.  No fundamental change to condition Blood pressure (!) 111/92, pulse (!) 121, temperature 98.3 F (36.8 C), temperature source Oral, resp. rate 18, height 5\' 8"  (1.727 m), weight 85.8 kg, SpO2 97 %. Body mass index is 28.76 kg/m.   Treatment Plan Summary: Plan no change to medication or treatment plan.  Waiting on placement.  , MD 05/21/2022, 1:14 PM

## 2022-05-21 NOTE — Plan of Care (Signed)
Problem: Education: Goal: Ability to state activities that reduce stress will improve 05/21/2022 1123 by Ardelle [place], RN Outcome: Adequate for Discharge 05/21/2022 1009 by Ardelle [place], RN Outcome: Progressing   Problem: Coping: Goal: Ability to identify and develop effective coping behavior will improve 05/21/2022 1123 by Ardelle [place], RN Outcome: Adequate for Discharge 05/21/2022 1009 by Ardelle [place], RN Outcome: Progressing   Problem: Self-Concept: Goal: Ability to identify factors that promote anxiety will improve 05/21/2022 1123 by Ardelle [place], RN Outcome: Adequate for Discharge 05/21/2022 1009 by Ardelle [place], RN Outcome: Progressing Goal: Level of anxiety will decrease 05/21/2022 1123 by Ardelle [place], RN Outcome: Adequate for Discharge 05/21/2022 1009 by Ardelle [place], RN Outcome: Progressing Goal: Ability to modify response to factors that promote anxiety will improve 05/21/2022 1123 by Ardelle [place], RN Outcome: Adequate for Discharge 05/21/2022 1009 by Ardelle [place], RN Outcome: Progressing   Problem: Education: Goal: Knowledge of General Education information will improve Description: Including pain rating scale, medication(s)/side effects and non-pharmacologic comfort measures 05/21/2022 1123 by Ardelle [place], RN Outcome: Adequate for Discharge 05/21/2022 1009 by Ardelle [place], RN Outcome: Progressing   Problem: Health Behavior/Discharge Planning: Goal: Ability to manage health-related needs will improve 05/21/2022 1123 by Ardelle [place], RN Outcome: Adequate for Discharge 05/21/2022 1009 by Ardelle [place], RN Outcome: Progressing   Problem: Clinical Measurements: Goal: Ability to maintain clinical measurements within normal limits will improve 05/21/2022 1123 by Ardelle [place], RN Outcome: Adequate for  Discharge 05/21/2022 1009 by Ardelle [place], RN Outcome: Progressing Goal: Will remain free from infection 05/21/2022 1123 by Ardelle [place], RN Outcome: Adequate for Discharge 05/21/2022 1009 by Ardelle [place], RN Outcome: Progressing Goal: Diagnostic test results will improve 05/21/2022 1123 by Ardelle [place], RN Outcome: Adequate for Discharge 05/21/2022 1009 by Ardelle [place], RN Outcome: Progressing Goal: Respiratory complications will improve 05/21/2022 1123 by Ardelle [place], RN Outcome: Adequate for Discharge 05/21/2022 1009 by Ardelle [place], RN Outcome: Progressing Goal: Cardiovascular complication will be avoided 05/21/2022 1123 by Ardelle [place], RN Outcome: Adequate for Discharge 05/21/2022 1009 by Ardelle [place], RN Outcome: Progressing   Problem: Activity: Goal: Risk for activity intolerance will decrease 05/21/2022 1123 by Ardelle [place], RN Outcome: Adequate for Discharge 05/21/2022 1009 by Ardelle [place], RN Outcome: Progressing   Problem: Nutrition: Goal: Adequate nutrition will be maintained 05/21/2022 1123 by Ardelle [place], RN Outcome: Adequate for Discharge 05/21/2022 1009 by Ardelle [place], RN Outcome: Progressing   Problem: Coping: Goal: Level of anxiety will decrease 05/21/2022 1123 by Ardelle [place], RN Outcome: Adequate for Discharge 05/21/2022 1009 by Ardelle [place], RN Outcome: Progressing   Problem: Elimination: Goal: Will not experience complications related to bowel motility 05/21/2022 1123 by Ardelle [place], RN Outcome: Adequate for Discharge 05/21/2022 1009 by Ardelle [place], RN Outcome: Progressing Goal: Will not experience complications related to urinary retention 05/21/2022 1123 by Ardelle [place], RN Outcome: Adequate for Discharge 05/21/2022 1009 by Ardelle [place],  RN Outcome: Progressing   Problem: Pain Managment: Goal: General experience of comfort will improve 05/21/2022 1123 by Ardelle [place], RN Outcome: Adequate for Discharge 05/21/2022 1009 by Ardelle [place], RN Outcome: Progressing   Problem: Safety: Goal: Ability to remain free from injury will improve 05/21/2022 1123 by Ardelle [place], RN Outcome: Adequate for Discharge 05/21/2022 1009 by Ardelle [place], RN Outcome: Progressing   Problem: Skin Integrity:  Goal: Risk for impaired skin integrity will decrease 05/21/2022 1123 by Ardelle [place], RN Outcome: Adequate for Discharge 05/21/2022 1009 by Ardelle [place], RN Outcome: Progressing

## 2022-05-22 NOTE — Progress Notes (Signed)
Recreation Therapy Notes   Date: 05/22/2022  Time: 1:15pm    Location: Craft room    Behavioral response: N/A   Intervention Topic: Problem Solving     Discussion/Intervention: Patient refused to attend group.   Clinical Observations/Feedback:  Patient refused to attend group.    Geanie Pacifico LRT/CTRS           Sissi Padia 05/22/2022 2:33 PM

## 2022-05-22 NOTE — Progress Notes (Signed)
[facility] MD Progress Note  05/22/2022 4:21 PM Adam [name]  MRN:  [id number] Subjective: Follow-up patient with schizophrenia and severe neurologic impairment.  No change to behavior.  No complaint.  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 No pertinent surgical history. Family History: History reviewed. No pertinent family history. Family Psychiatric  History: See previous Social History:  Social History   Substance and Sexual Activity  Alcohol Use Not Currently     Social History   Substance and Sexual Activity  Drug Use Not Currently    Social History   Socioeconomic History   Marital status: Single    Spouse name: Not on file   Number of children: Not on file   Years of education: Not on file   Highest education level: Not on file  Occupational History   Not on file  Tobacco Use   Smoking status: Never    Passive exposure: Never   Smokeless tobacco: Never  Vaping Use   Vaping Use: Unknown  Substance and Sexual Activity   Alcohol use: Not Currently   Drug use: Not Currently   Sexual activity: Not Currently  Other Topics Concern   Not on file  Social History Narrative   Not on file   Social Determinants of Health   Financial Resource Strain: Not on file  Food Insecurity: Not on file  Transportation  Needs: Not on file  Physical Activity: Not on file  Stress: Not on file  Social Connections: Not on file   Additional Social History:  Specify valuables returned:  (none)                      Sleep: Fair  Appetite:  Fair  Current Medications: Current Facility-Administered Medications  Medication Dose Route Frequency Provider Last Rate Last Admin   acetaminophen (TYLENOL) tablet 650 mg  650 mg Oral Q6H PRN Fruma Africa T, MD   650 mg at 04/26/22 0637   alum & mag hydroxide-simeth (MAALOX/MYLANTA) 200-200-20 MG/5ML suspension 30 mL  30 mL Oral Q4H PRN Indianna Boran T, MD   30 mL at 04/26/22 8889   aspirin EC tablet 81 mg  81 mg Oral Daily Hadlyn Amero T, MD   81 mg at 05/22/22 0840   atorvastatin (LIPITOR) tablet 20 mg  20 mg Oral Daily Elvy Mclarty T, MD   20 mg at 05/22/22 0840   atropine 1 % ophthalmic solution 1 drop  1 drop Sublingual QID [name], MD   1 drop at 05/22/22 1204   benztropine (COGENTIN) tablet 0.5 mg  0.5 mg Oral BID Malikai Gut T, MD   0.5 mg at 05/22/22 0840   clonazePAM (KLONOPIN) tablet 1 mg  1 mg Oral TID PRN He, [name], MD   1 mg at 05/12/22 1622   cloZAPine (CLOZARIL) tablet 300 mg  300 mg Oral QHS Farren Nelles,  [name], MD   300 mg at 05/21/22 2103   diphenhydrAMINE (BENADRYL) capsule 50 mg  50 mg Oral Q6H PRN Benjamyn Hestand, [name], MD   50 mg at 05/12/22 1158   Or   diphenhydrAMINE (BENADRYL) injection 50 mg  50 mg Intramuscular Q6H PRN Naria Abbey, [name], MD       gabapentin (NEURONTIN) capsule 300 mg  300 mg Oral TID Jada Fass, [name], MD   300 mg at 05/22/22 1204   haloperidol (HALDOL) tablet 5 mg  5 mg Oral TID Levy Wellman, [name], MD   5 mg at 05/22/22 1204   haloperidol (HALDOL) tablet 5 mg  5 mg Oral Q6H PRN Donnavan Covault, [name], MD   5 mg at 05/03/22 1813   Or   haloperidol lactate (HALDOL) injection 5 mg  5 mg Intramuscular Q6H PRN Koltan Portocarrero, [name], MD       magnesium hydroxide (MILK OF MAGNESIA) suspension 30 mL  30 mL Oral Daily PRN Riker Collier T, MD   30  mL at 05/02/22 3825   metoprolol succinate (TOPROL-XL) 24 hr tablet 25 mg  25 mg Oral Daily Brennyn Haisley T, MD   25 mg at 05/22/22 0840   paliperidone (INVEGA SUSTENNA) injection 156 mg  156 mg Intramuscular Q28 days Dwan Hemmelgarn T, MD   156 mg at 04/26/22 1000   temazepam (RESTORIL) capsule 15 mg  15 mg Oral QHS Chardae Mulkern T, MD   15 mg at 05/21/22 2103   ziprasidone (GEODON) injection 20 mg  20 mg Intramuscular Q12H PRN Gerarda Conklin, [name], MD   20 mg at 05/03/22 0103    Lab Results:  Results for orders placed or performed during the hospital encounter of 03/19/22 (from the past 48 hour(s))  CBC with Differential/Platelet     Status: Abnormal   Collection Time: 05/21/22  9:09 AM  Result Value Ref Range   WBC 3.0 (L) 4.0 - 10.5 K/uL   RBC 4.84 4.22 - 5.81 MIL/uL   Hemoglobin 14.1 13.0 - 17.0 g/dL   HCT 05.3 97.6 - 73.4 %   MCV 87.6 80.0 - 100.0 fL   MCH 29.1 26.0 - 34.0 pg   MCHC 33.3 30.0 - 36.0 g/dL   RDW 19.3 79.0 - 24.0 %   Platelets 156 150 - 400 K/uL   nRBC 0.0 0.0 - 0.2 %   Neutrophils Relative % 53 %   Neutro Abs 1.6 (L) 1.7 - 7.7 K/uL   Lymphocytes Relative 39 %   Lymphs Abs 1.2 0.7 - 4.0 K/uL   Monocytes Relative 7 %   Monocytes Absolute 0.2 0.1 - 1.0 K/uL   Eosinophils Relative 0 %   Eosinophils Absolute 0.0 0.0 - 0.5 K/uL   Basophils Relative 1 %   Basophils Absolute 0.0 0.0 - 0.1 K/uL   Immature Granulocytes 0 %   Abs Immature Granulocytes 0.01 0.00 - 0.07 K/uL    Comment: Performed at [facility], [address] [place], [place] [address]    Blood Alcohol level:  Lab Results  Component Value Date   [facility] <10 07/20/2021    Metabolic Disorder Labs: Lab Results  Component Value Date   HGBA1C 5.5 04/10/2022   MPG 111.15 04/10/2022   No results found for: "PROLACTIN" Lab Results  Component Value Date   CHOL 167 11/20/2021   TRIG 107 11/20/2021   HDL 26 (L) 11/20/2021   CHOLHDL 6.4 11/20/2021   VLDL 21 11/20/2021   LDLCALC 120 (H)  11/20/2021    Physical Findings: AIMS: Facial and Oral Movements Muscles of Facial Expression: None, normal Lips and Perioral Area: None, normal Jaw: None, normal Tongue: None, normal,Extremity Movements Upper (arms, wrists, hands, fingers): None, normal Lower (legs, knees, ankles, toes): None, normal, Trunk Movements Neck, shoulders, hips: None, normal, Overall Severity Severity of abnormal movements (highest score from questions above): None, normal Incapacitation due to abnormal movements: None, normal Patient's awareness of abnormal movements (rate only patient's report): No Awareness, Dental Status Current problems with teeth and/or dentures?: No Does patient usually wear dentures?: No  CIWA:    COWS:     Musculoskeletal: Strength & Muscle Tone: within normal limits Gait & Station: normal Patient leans: N/A  Psychiatric Specialty Exam:  Presentation  General Appearance: Appropriate for Environment; Casual; Neat; Well Groomed  Eye Contact:Fair  Speech:Slow  Speech Volume:Decreased  Handedness:Left   Mood and Affect  Mood:Anxious  Affect:Congruent   Thought Process  Thought Processes:Goal Directed  Descriptions of Associations:Circumstantial  Orientation:Partial  Thought Content:Scattered  History of Schizophrenia/Schizoaffective disorder:Yes  Duration of Psychotic Symptoms:Greater than six months  Hallucinations:No data recorded Ideas of Reference:None  Suicidal Thoughts:No data recorded Homicidal Thoughts:No data recorded  Sensorium  Memory:Remote Good  Judgment:Fair  Insight:Fair   Executive Functions  Concentration:Fair  Attention Span:Fair  Recall:Fair  Fund of Knowledge:Fair  Language:Fair   Psychomotor Activity  Psychomotor Activity:No data recorded  Assets  Assets:Housing; Resilience; Social Support; Talents/Skills   Sleep  Sleep:No data recorded   Physical Exam: Physical Exam Vitals and nursing note reviewed.   Constitutional:      Appearance: Normal appearance.  HENT:     Head: Normocephalic and atraumatic.     Mouth/Throat:     Pharynx: Oropharynx is clear.  Eyes:     Pupils: Pupils are equal, round, and reactive to light.  Cardiovascular:     Rate and Rhythm: Normal rate and regular rhythm.  Pulmonary:     Effort: Pulmonary effort is normal.     Breath sounds: Normal breath sounds.  Abdominal:     General: Abdomen is flat.     Palpations: Abdomen is soft.  Musculoskeletal:        General: Normal range of motion.  Skin:    General: Skin is warm and dry.  Neurological:     General: No focal deficit present.     Mental Status: He is alert. Mental status is at baseline.  Psychiatric:        Attention and Perception: He is inattentive.        Mood and Affect: Affect is inappropriate.        Speech: Speech is tangential.    Review of Systems  Unable to perform ROS: Language   Blood pressure (!) 145/89, pulse 86, temperature 98 F (36.7 C), temperature source Oral, resp. rate 17, height 5\' 8"  (1.727 m), weight 85.8 kg, SpO2 100 %. Body mass index is 28.76 kg/m.   Treatment Plan Summary: Plan no change to treatment plan.  Patient continues to be a long-term border in search of some kind of placement  , MD 05/22/2022, 4:21 PM

## 2022-05-22 NOTE — Plan of Care (Signed)
°  Problem: Group Participation °Goal: STG - Patient will engage in groups with a calm and appropriate mood at least 2x within 5 recreation therapy group sessions °Description: STG - Patient will engage in groups with a calm and appropriate mood at least 2x within 5 recreation therapy group sessions °Outcome: Not Progressing °  °

## 2022-05-22 NOTE — Progress Notes (Addendum)
Pt observed to be anxious / restless with labile mood at intervals thus far this shift. Noted with intermittent yelling /screams and outbursts banging on nursing station window when his demands are not met. Easily agitated because he was redirected from supply room and from opening new pack of boxers. Required multiple redirections to keep his tablet in his room as he was playing loud music with it in dayroom. Pt did not attend scheduled group despite multiple prompts. Received PRN Klonopin 1 mg PO at 1735 for agitation and is calmer when reassessed at 1835. Q 15 minutes safety checks maintained without incident. Support, encouragement and reassurance offered to pt. He tolerates all medications, meals and fluids well without discomfort. Safety maintained in milieu.

## 2022-05-23 NOTE — Progress Notes (Signed)
At the nurses station  requesting help from the tech. Takes her to closet.and is pointing to underwear.  Was upset that he was  not able to fit any of the ones he initially chose.   C Butler-Nicholson, LPN

## 2022-05-23 NOTE — Progress Notes (Signed)
Patient required a lot of redirection during the evening, he was yelling loud at times and requesting various items. He received night time medication and he seemed to calm down and hour later. He appeared to rest well through out then night.

## 2022-05-23 NOTE — Progress Notes (Signed)
D: Patient alert and oriented. Patient denies pain. Patient denies anxiety and depression. Patient denies SI/HI/AVH. Patient has needed redirection and reminding  throughout the shift not to  bring certain items from his room out around the unit. Patient observed frequently walking around the unit.    A: Scheduled medications administered to patient, per MD orders.  Support and encouragement provided to patient.  Q15 minute safety checks maintained.   R: Patient compliant with medication administration and treatment plan. No adverse drug reactions noted. Patient remains safe on the unit at this time.

## 2022-05-23 NOTE — Progress Notes (Signed)
Recreation Therapy Notes  Date: 05/23/2022   Time: 10:45 am     Location: Craft room     Behavioral response: N/A   Intervention Topic: Decision Making    Discussion/Intervention: Patient refused to attend group.    Clinical Observations/Feedback:  Patient refused to attend group.    Lisle Skillman LRT/CTRS          Cortney Beissel 05/23/2022 10:57 AM

## 2022-05-23 NOTE — Progress Notes (Signed)
[facility] MD Progress Note  05/23/2022 4:06 PM Adam [name]  MRN:  [id number] Subjective: No complaints.  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 No pertinent surgical history. Family History: History reviewed. No pertinent family history. Family Psychiatric  History: See previous Social History:  Social History   Substance and Sexual Activity  Alcohol Use Not Currently     Social History   Substance and Sexual Activity  Drug Use Not Currently    Social History   Socioeconomic History   Marital status: Single    Spouse name: Not on file   Number of children: Not on file   Years of education: Not on file   Highest education level: Not on file  Occupational History   Not on file  Tobacco Use   Smoking status: Never    Passive exposure: Never   Smokeless tobacco: Never  Vaping Use   Vaping Use: Unknown  Substance and Sexual Activity   Alcohol use: Not Currently   Drug use: Not Currently   Sexual activity: Not Currently  Other Topics Concern   Not on file  Social History Narrative   Not on file   Social Determinants of Health   Financial Resource Strain: Not on file  Food Insecurity: Not on file  Transportation Needs: Not on file  Physical Activity: Not on file  Stress: Not on file  Social Connections: Not on file   Additional Social  History:  Specify valuables returned:  (none)                      Sleep: Fair  Appetite:  Fair  Current Medications: Current Facility-Administered Medications  Medication Dose Route Frequency Provider Last Rate Last Admin   acetaminophen (TYLENOL) tablet 650 mg  650 mg Oral Q6H PRN Irish Breisch T, MD   650 mg at 05/22/22 2054   alum & mag hydroxide-simeth (MAALOX/MYLANTA) 200-200-20 MG/5ML suspension 30 mL  30 mL Oral Q4H PRN Toddy Boyd T, MD   30 mL at 04/26/22 0865   aspirin EC tablet 81 mg  81 mg Oral Daily Elyas Villamor, [name], MD   81 mg at 05/23/22 0824   atorvastatin (LIPITOR) tablet 20 mg  20 mg Oral Daily Trixie Maclaren T, MD   20 mg at 05/23/22 0823   atropine 1 % ophthalmic solution 1 drop  1 drop Sublingual QID [name], MD   1 drop at 05/23/22 1211   benztropine (COGENTIN) tablet 0.5 mg  0.5 mg Oral BID Mayukha Symmonds T, MD   0.5 mg at 05/23/22 0824   clonazePAM (KLONOPIN) tablet 1 mg  1 mg Oral TID PRN He, [name], MD   1 mg at 05/22/22 1735   cloZAPine (CLOZARIL) tablet 300 mg  300 mg Oral QHS Cady Hafen T, MD   300 mg at 05/22/22 2054   diphenhydrAMINE (BENADRYL) capsule 50 mg  50 mg Oral Q6H  PRN Daden Mahany, [name], MD   50 mg at 05/12/22 1158   Or   diphenhydrAMINE (BENADRYL) injection 50 mg  50 mg Intramuscular Q6H PRN Crist Kruszka T, MD       gabapentin (NEURONTIN) capsule 300 mg  300 mg Oral TID Anniece Bleiler T, MD   300 mg at 05/23/22 1211   haloperidol (HALDOL) tablet 5 mg  5 mg Oral TID Loris Seelye, [name], MD   5 mg at 05/23/22 1211   haloperidol (HALDOL) tablet 5 mg  5 mg Oral Q6H PRN Caylan Schifano T, MD   5 mg at 05/03/22 1813   Or   haloperidol lactate (HALDOL) injection 5 mg  5 mg Intramuscular Q6H PRN Jerolyn Flenniken T, MD       magnesium hydroxide (MILK OF MAGNESIA) suspension 30 mL  30 mL Oral Daily PRN Aryam Zhan T, MD   30 mL at 05/02/22 0254   metoprolol succinate (TOPROL-XL) 24 hr tablet 25 mg  25 mg Oral Daily Khaleelah Yowell T, MD   25 mg at  05/23/22 2706   paliperidone (INVEGA SUSTENNA) injection 156 mg  156 mg Intramuscular Q28 days Maudell Stanbrough T, MD   156 mg at 04/26/22 1000   temazepam (RESTORIL) capsule 15 mg  15 mg Oral QHS Ailanie Ruttan T, MD   15 mg at 05/22/22 2053   ziprasidone (GEODON) injection 20 mg  20 mg Intramuscular Q12H PRN Netanel Yannuzzi, [name], MD   20 mg at 05/03/22 0103    Lab Results: No results found for this or any previous visit (from the past 48 hour(s)).  Blood Alcohol level:  Lab Results  Component Value Date   ETH <10 07/20/2021    Metabolic Disorder Labs: Lab Results  Component Value Date   HGBA1C 5.5 04/10/2022   MPG 111.15 04/10/2022   No results found for: "PROLACTIN" Lab Results  Component Value Date   CHOL 167 11/20/2021   TRIG 107 11/20/2021   HDL 26 (L) 11/20/2021   CHOLHDL 6.4 11/20/2021   VLDL 21 11/20/2021   LDLCALC 120 (H) 11/20/2021    Physical Findings: AIMS: Facial and Oral Movements Muscles of Facial Expression: None, normal Lips and Perioral Area: None, normal Jaw: None, normal Tongue: None, normal,Extremity Movements Upper (arms, wrists, hands, fingers): None, normal Lower (legs, knees, ankles, toes): None, normal, Trunk Movements Neck, shoulders, hips: None, normal, Overall Severity Severity of abnormal movements (highest score from questions above): None, normal Incapacitation due to abnormal movements: None, normal Patient's awareness of abnormal movements (rate only patient's report): No Awareness, Dental Status Current problems with teeth and/or dentures?: No Does patient usually wear dentures?: No  CIWA:    COWS:     Musculoskeletal: Strength & Muscle Tone: within normal limits Gait & Station: normal Patient leans: N/A  Psychiatric Specialty Exam:  Presentation  General Appearance: Appropriate for Environment; Casual; Neat; Well Groomed  Eye Contact:Fair  Speech:Slow  Speech Volume:Decreased  Handedness:Left   Mood and Affect   Mood:Anxious  Affect:Congruent   Thought Process  Thought Processes:Goal Directed  Descriptions of Associations:Circumstantial  Orientation:Partial  Thought Content:Scattered  History of Schizophrenia/Schizoaffective disorder:Yes  Duration of Psychotic Symptoms:Greater than six months  Hallucinations:No data recorded Ideas of Reference:None  Suicidal Thoughts:No data recorded Homicidal Thoughts:No data recorded  Sensorium  Memory:Remote Good  Judgment:Fair  Insight:Fair   Executive Functions  Concentration:Fair  Attention Span:Fair  Recall:Fair  Fund of Knowledge:Fair  Language:Fair   Psychomotor Activity  Psychomotor Activity:No data recorded  Assets  Assets:Housing; Resilience; Social Support; Talents/Skills   Sleep  Sleep:No data recorded   Physical Exam: Physical Exam Vitals and nursing note reviewed.  Constitutional:      Appearance: Normal appearance.  HENT:     Head: Normocephalic and atraumatic.     Mouth/Throat:     Pharynx: Oropharynx is clear.  Eyes:     Pupils: Pupils are equal, round, and reactive to light.  Cardiovascular:     Rate and Rhythm: Normal rate and regular rhythm.  Pulmonary:     Effort: Pulmonary effort is normal.     Breath sounds: Normal breath sounds.  Abdominal:     General: Abdomen is flat.     Palpations: Abdomen is soft.  Musculoskeletal:        General: Normal range of motion.  Skin:    General: Skin is warm and dry.  Neurological:     General: No focal deficit present.     Mental Status: He is alert. Mental status is at baseline.  Psychiatric:        Attention and Perception: He is inattentive.        Speech: He is noncommunicative.    Review of Systems  Unable to perform ROS: Language   Blood pressure (!) 115/56, pulse 93, temperature 97.8 F (36.6 C), temperature source Oral, resp. rate 17, height 5\' 8"  (1.727 m), weight 85.8 kg, SpO2 100 %. Body mass index is 28.76 kg/m.   Treatment  Plan Summary: Plan no change to treatment plan.  Waiting on discharge.  , MD 05/23/2022, 4:06 PM

## 2022-05-23 NOTE — Plan of Care (Signed)
  Problem: Self-Concept: Goal: Level of anxiety will decrease Outcome: Progressing Goal: Ability to modify response to factors that promote anxiety will improve Outcome: Progressing   Problem: Coping: Goal: Ability to identify and develop effective coping behavior will improve Outcome: Progressing   Problem: Education: Goal: Ability to state activities that reduce stress will improve Outcome: Progressing

## 2022-05-24 NOTE — Progress Notes (Signed)
   05/24/22 2200  Psych Admission Type (Psych Patients Only)  Admission Status Involuntary  Psychosocial Assessment  Patient Complaints None  Eye Contact Fair  Facial Expression Anxious;Fixed smile  Affect Appropriate to circumstance  Speech Aphasic  Interaction Childlike;Needy  Motor Activity Fidgety;Pacing  Appearance/Hygiene Unremarkable  Behavior Characteristics Anxious;Fidgety;Impulsive  Mood Labile;Anxious  Thought Process  Coherency Tangential  Content Preoccupation  Delusions None reported or observed  Perception UTA  Hallucination None reported or observed  Judgment Impaired  Confusion None  Danger to Self  Current suicidal ideation? Denies  Agreement Not to Harm Self Yes  Description of Agreement VERBAL

## 2022-05-24 NOTE — Progress Notes (Signed)
[facility] MD Progress Note  05/24/2022 3:57 PM Adam [name]  MRN:  [id number] Subjective: Patient has no new complaints.  No change to his overall behavior.  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 No pertinent surgical history. Family History: History reviewed. No pertinent family history. Family Psychiatric  History: See previous Social History:  Social History   Substance and Sexual Activity  Alcohol Use Not Currently     Social History   Substance and Sexual Activity  Drug Use Not Currently    Social History   Socioeconomic History   Marital status: Single    Spouse name: Not on file   Number of children: Not on file   Years of education: Not on file   Highest education level: Not on file  Occupational History   Not on file  Tobacco Use   Smoking status: Never    Passive exposure: Never   Smokeless tobacco: Never  Vaping Use   Vaping Use: Unknown  Substance and Sexual Activity   Alcohol use: Not Currently   Drug use: Not Currently   Sexual activity: Not Currently  Other Topics Concern   Not on file  Social History Narrative   Not on file   Social Determinants of Health   Financial Resource Strain: Not on file  Food Insecurity: Not on file  Transportation Needs: Not on file  Physical Activity:  Not on file  Stress: Not on file  Social Connections: Not on file   Additional Social History:  Specify valuables returned:  (none)                      Sleep: Fair  Appetite:  Fair  Current Medications: Current Facility-Administered Medications  Medication Dose Route Frequency Provider Last Rate Last Admin   acetaminophen (TYLENOL) tablet 650 mg  650 mg Oral Q6H PRN Asante Ritacco T, MD   650 mg at 05/22/22 2054   alum & mag hydroxide-simeth (MAALOX/MYLANTA) 200-200-20 MG/5ML suspension 30 mL  30 mL Oral Q4H PRN Amyr Sluder T, MD   30 mL at 04/26/22 6720   aspirin EC tablet 81 mg  81 mg Oral Daily Adriyanna Christians, [name], MD   81 mg at 05/24/22 1122   atorvastatin (LIPITOR) tablet 20 mg  20 mg Oral Daily Addilee Neu T, MD   20 mg at 05/24/22 1122   atropine 1 % ophthalmic solution 1 drop  1 drop Sublingual QID [name], MD   1 drop at 05/24/22 1123   benztropine (COGENTIN) tablet 0.5 mg  0.5 mg Oral BID Aairah Negrette T, MD   0.5 mg at 05/24/22 1123   clonazePAM (KLONOPIN) tablet 1 mg  1 mg Oral TID PRN He, [name], MD   1 mg at 05/22/22 1735   cloZAPine (CLOZARIL) tablet 300 mg  300 mg Oral QHS Kenzey Birkland, [name], MD  300 mg at 05/23/22 2126   diphenhydrAMINE (BENADRYL) capsule 50 mg  50 mg Oral Q6H PRN Shalana Jardin T, MD   50 mg at 05/12/22 1158   Or   diphenhydrAMINE (BENADRYL) injection 50 mg  50 mg Intramuscular Q6H PRN Ason Heslin T, MD       gabapentin (NEURONTIN) capsule 300 mg  300 mg Oral TID Shantil Vallejo T, MD   300 mg at 05/24/22 1122   haloperidol (HALDOL) tablet 5 mg  5 mg Oral TID Kirubel Aja T, MD   5 mg at 05/24/22 1123   haloperidol (HALDOL) tablet 5 mg  5 mg Oral Q6H PRN Jayin Derousse T, MD   5 mg at 05/03/22 1813   Or   haloperidol lactate (HALDOL) injection 5 mg  5 mg Intramuscular Q6H PRN Kahli Fitzgerald T, MD       magnesium hydroxide (MILK OF MAGNESIA) suspension 30 mL  30 mL Oral Daily PRN Shamonique Battiste T, MD   30 mL at 05/02/22 6948   metoprolol  succinate (TOPROL-XL) 24 hr tablet 25 mg  25 mg Oral Daily Koda Routon T, MD   25 mg at 05/24/22 1123   paliperidone (INVEGA SUSTENNA) injection 156 mg  156 mg Intramuscular Q28 days Karem Tomaso T, MD   156 mg at 05/24/22 1123   temazepam (RESTORIL) capsule 15 mg  15 mg Oral QHS Cederic Mozley T, MD   15 mg at 05/23/22 2126   ziprasidone (GEODON) injection 20 mg  20 mg Intramuscular Q12H PRN Naimah Yingst, [name], MD   20 mg at 05/03/22 0103    Lab Results: No results found for this or any previous visit (from the past 48 hour(s)).  Blood Alcohol level:  Lab Results  Component Value Date   ETH <10 07/20/2021    Metabolic Disorder Labs: Lab Results  Component Value Date   HGBA1C 5.5 04/10/2022   MPG 111.15 04/10/2022   No results found for: "PROLACTIN" Lab Results  Component Value Date   CHOL 167 11/20/2021   TRIG 107 11/20/2021   HDL 26 (L) 11/20/2021   CHOLHDL 6.4 11/20/2021   VLDL 21 11/20/2021   LDLCALC 120 (H) 11/20/2021    Physical Findings: AIMS: Facial and Oral Movements Muscles of Facial Expression: None, normal Lips and Perioral Area: None, normal Jaw: None, normal Tongue: None, normal,Extremity Movements Upper (arms, wrists, hands, fingers): None, normal Lower (legs, knees, ankles, toes): None, normal, Trunk Movements Neck, shoulders, hips: None, normal, Overall Severity Severity of abnormal movements (highest score from questions above): None, normal Incapacitation due to abnormal movements: None, normal Patient's awareness of abnormal movements (rate only patient's report): No Awareness, Dental Status Current problems with teeth and/or dentures?: No Does patient usually wear dentures?: No  CIWA:    COWS:     Musculoskeletal: Strength & Muscle Tone: within normal limits Gait & Station: normal Patient leans: N/A  Psychiatric Specialty Exam:  Presentation  General Appearance: Appropriate for Environment; Casual; Neat; Well Groomed  Eye  Contact:Fair  Speech:Slow  Speech Volume:Decreased  Handedness:Left   Mood and Affect  Mood:Anxious  Affect:Congruent   Thought Process  Thought Processes:Goal Directed  Descriptions of Associations:Circumstantial  Orientation:Partial  Thought Content:Scattered  History of Schizophrenia/Schizoaffective disorder:Yes  Duration of Psychotic Symptoms:Greater than six months  Hallucinations:No data recorded Ideas of Reference:None  Suicidal Thoughts:No data recorded Homicidal Thoughts:No data recorded  Sensorium  Memory:Remote Good  Judgment:Fair  Insight:Fair   Executive Functions  Concentration:Fair  Attention Span:Fair  Recall:Fair  Fund of Knowledge:Fair  Language:Fair   Psychomotor Activity  Psychomotor Activity:No data recorded  Assets  Assets:Housing; Resilience; Social Support; Talents/Skills   Sleep  Sleep:No data recorded   Physical Exam: Physical Exam Vitals and nursing note reviewed.  Constitutional:      Appearance: Normal appearance.  HENT:     Head: Normocephalic and atraumatic.     Mouth/Throat:     Pharynx: Oropharynx is clear.  Eyes:     Pupils: Pupils are equal, round, and reactive to light.  Cardiovascular:     Rate and Rhythm: Normal rate and regular rhythm.  Pulmonary:     Effort: Pulmonary effort is normal.     Breath sounds: Normal breath sounds.  Abdominal:     General: Abdomen is flat.     Palpations: Abdomen is soft.  Musculoskeletal:        General: Normal range of motion.  Skin:    General: Skin is warm and dry.  Neurological:     General: No focal deficit present.     Mental Status: He is alert. Mental status is at baseline.  Psychiatric:        Attention and Perception: He is inattentive.        Mood and Affect: Affect is inappropriate.        Speech: He is noncommunicative.    Review of Systems  Unable to perform ROS: Language   Blood pressure (!) 115/56, pulse 93, temperature 97.8 F (36.6  C), temperature source Oral, resp. rate 17, height 5\' 8"  (1.727 m), weight 85.8 kg, SpO2 100 %. Body mass index is 28.76 kg/m.   Treatment Plan Summary: Plan nothing new with this patient.  No change to clinical condition or behavior.  Continue only waiting for disposition  , MD 05/24/2022, 3:57 PM

## 2022-05-24 NOTE — Progress Notes (Signed)
Med compliant. No issues to report. Denies si  hi  avh and pain at this encounter. Will continue to monitor and redirect as needed.     C Butler-Nicholson, LPN

## 2022-05-24 NOTE — Progress Notes (Signed)
D: Patient alert and oriented. Patient denies pain. Patient denies anxiety and depression. Patient denies SI/HI/AVH.  Patient frequently observed walking around the unit. Patient received Invega IM injection.  A: Scheduled medications administered to patient, per MD orders.  Support and encouragement provided to patient.  Q15 minute safety checks maintained.   R: Patient compliant with medication administration and treatment plan. No adverse drug reactions noted. Patient remains safe on the unit at this time.

## 2022-05-24 NOTE — Plan of Care (Signed)
  Problem: Education: Goal: Ability to state activities that reduce stress will improve Outcome: Progressing   Problem: Self-Concept: Goal: Ability to identify factors that promote anxiety will improve Outcome: Progressing Goal: Level of anxiety will decrease Outcome: Progressing   

## 2022-05-24 NOTE — Group Note (Signed)
[facility] LCSW Group Therapy Note   Group Date: 05/24/2022 Start Time: 1300 End Time: 1400   Type of Therapy/Topic:  Group Therapy:  Balance in Life  Participation Level:  None   Description of Group:    This group will address the concept of balance and how it feels and looks when one is unbalanced. Patients will be encouraged to process areas in their lives that are out of balance, and identify reasons for remaining unbalanced. Facilitators will guide patients utilizing problem- solving interventions to address and correct the stressor making their life unbalanced. Understanding and applying boundaries will be explored and addressed for obtaining  and maintaining a balanced life. Patients will be encouraged to explore ways to assertively make their unbalanced needs known to significant others in their lives, using other group members and facilitator for support and feedback.  Therapeutic Goals: Patient will identify two or more emotions or situations they have that consume much of in their lives. Patient will identify signs/triggers that life has become out of balance:  Patient will identify two ways to set boundaries in order to achieve balance in their lives:  Patient will demonstrate ability to communicate their needs through discussion and/or role plays  Summary of Patient Progress: Patient was present in group, however was unable to process or follow along to group discussion. Patient was inappropriate throughout group stating "Cleopatra", "juice" and "trouble" at random intervals. Patient became upset and left group when CSW asked pt to not hand out masks.  Patient remained upset with this CSW throughout the rest of the day, coming to the nurses station angrily stating "you" and pointing his finger.   Therapeutic Modalities:   Cognitive Behavioral Therapy Solution-Focused Therapy Assertiveness Training   Harden Mo, LCSW

## 2022-05-25 NOTE — Progress Notes (Signed)
Pt slept late this morning. Pt has had a good appetite and has been med compliant. He has been assertive and somewhat social in the dayroom. He has outburst but doesn't seem to be out of anger. He paces a lot. Torrie Mayers RN

## 2022-05-27 NOTE — Progress Notes (Signed)
Pt visible on the unit, minimal interaction with peers and staff. Pt denies  Si/HI and AVH. 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 Pt can process information, but he usually communicates in sounds, pointing and saying one word. Pt repeats certain phrases over and over. Pt can say some things coherently and in complete sentences. Pt got a Gatorade for snack. He watches his Ipad/phone, and listens to music on his phone. He denies any anxiety and depression. Pt reminded that he has to wait sometime and not to be demanding. Pt encouraged to do his ADLs and to clean up his room.

## 2022-05-27 NOTE — Progress Notes (Signed)
D: Patient alert and oriented. Patient denies pain. Patient denies anxiety and depression. Patient denies SI/HI/AVH. Patient frequently observed walking around the unit. Patient did become irritable after 1700 medication administration. Patient calmed down after going to bedroom.  A: Scheduled medications administered to patient, per MD orders. Patient refused 1200 scheduled haldol.Support and encouragement provided to patient.  Q15 minute safety checks maintained.   R: Patient compliant with medication administration and treatment plan. No adverse drug reactions noted. Patient remains safe on the unit at this time.

## 2022-05-28 LAB — CBC WITH DIFFERENTIAL/PLATELET
Abs Immature Granulocytes: 0.01 10*3/uL (ref 0.00–0.07)
Basophils Absolute: 0 10*3/uL (ref 0.0–0.1)
Basophils Relative: 1 %
Eosinophils Absolute: 0 10*3/uL (ref 0.0–0.5)
Eosinophils Relative: 0 %
HCT: 45 % (ref 39.0–52.0)
Hemoglobin: 14.9 g/dL (ref 13.0–17.0)
Immature Granulocytes: 0 %
Lymphocytes Relative: 44 %
Lymphs Abs: 2 10*3/uL (ref 0.7–4.0)
MCH: 28.9 pg (ref 26.0–34.0)
MCHC: 33.1 g/dL (ref 30.0–36.0)
MCV: 87.4 fL (ref 80.0–100.0)
Monocytes Absolute: 0.6 10*3/uL (ref 0.1–1.0)
Monocytes Relative: 14 %
Neutro Abs: 1.8 10*3/uL (ref 1.7–7.7)
Neutrophils Relative %: 41 %
Platelets: 151 10*3/uL (ref 150–400)
RBC: 5.15 MIL/uL (ref 4.22–5.81)
RDW: 13 % (ref 11.5–15.5)
WBC: 4.4 10*3/uL (ref 4.0–10.5)
nRBC: 0 % (ref 0.0–0.2)

## 2022-05-28 NOTE — Evaluation (Signed)
Speech Generating Device Evaluation  Patient Details Name: Adam [name] MRN: [id number] DOB: 1961-03-23     Associated Documents Scan on 05/28/2022 10:52 AM by Laury Axon, PT Scan on 05/28/2022 10:53 AM by Laury Axon, PT Scan on 05/28/2022 10:55 AM by Laury Axon, PT Scan on 05/28/2022 10:56 AM by Laury Axon, PT Scan on 05/28/2022 10:56 AM by Laury Axon, PT Scan on 05/28/2022 10:56 AM by Laury Axon, PT Scan on 05/28/2022 10:57 AM by Laury Axon, PT Scan on 05/28/2022 10:57 AM by Laury Axon, PT Scan on 05/28/2022 10:57 AM by Laury Axon, PT Scan on 05/28/2022 10:58 AM by Laury Axon, PT Scan on 05/28/2022 10:58 AM by Laury Axon, PT  Incomplete

## 2022-05-28 NOTE — Progress Notes (Signed)
Recreation Therapy Notes   Date: 05/28/2022  Time: 10:40 am     Location: Courtyard   Behavioral response: Isolated   Intervention Topic:  Leisure   Discussion/Intervention:  Group content today was focused on leisure. The group defined what leisure is and some positive leisure activities they participate in. Individuals identified the difference between good and bad leisure. Participants expressed how they feel after participating in the leisure of their choice. The group discussed how they go about picking a leisure activity and if others are involved in their leisure activities. The patient stated how many leisure activities they have to choose from and reasons why it is important to have leisure time. Individuals participated in the intervention "Exploration of Leisure" where they had a chance to identify new leisure activities as well as benefits of leisure. Clinical Observations/Feedback: Patient came to group late and was in and out of group. Participant kept to himself.  Lorrin Nawrot LRT/CTRS         Axel Frisk 05/28/2022 12:59 PM

## 2022-05-29 NOTE — TOC Progression Note (Addendum)
Transition of Care [facility]) - Progression Note    Patient Details  Name: Adam [name] MRN: [id number] Date of Birth: 04/06/1961  Transition of Care [facility]) CM/SW Contact  Margarito Liner, LCSW Phone Number: 05/29/2022, 12:12 PM  Clinical Narrative:  Obtained paperwork for General Power of Attorney which names patient's mother, Adam [name]. Sent to have it scanned into his chart.   3:02 pm: Received call from [facility] asking ALF referral be sent to [facility].   Expected Discharge Plan and Services                                                 Social Determinants of Health (SDOH) Interventions    Readmission Risk Interventions     No data to display

## 2022-05-29 NOTE — NC FL2 (Signed)
[place] MEDICAID FL2 LEVEL OF CARE SCREENING TOOL     IDENTIFICATION  Patient Name: Adam [name] Birthdate: [birth year] Sex: male Admission Date (Current Location): 03/19/2022  [facility] and [place] Number:  Chiropodist and Address:  [facility], [address] [place], [place] [address]      Provider Number: [id number]  Attending Physician Name and Address:  [name], [name], MD  Relative Name and Phone Number:       Current Level of Care: Hospital Recommended Level of Care: Assisted Living Facility Prior Approval Number:    Date Approved/Denied:   PASRR Number:    Discharge Plan: Other (Comment) (ALF)    Current Diagnoses: Patient Active Problem List   Diagnosis Date Noted   Difficulty controlling behavior as late effect of traumatic brain injury (HCC) 03/19/2022   UTI (urinary tract infection) 03/09/2022   Fall 02/24/2022   Aspiration pneumonia (HCC) 02/18/2022   Constipation 02/09/2022   Elevated CK 01/15/2022   TBI (traumatic brain injury) (HCC) 11/27/2021   Stroke (HCC) 11/27/2021   NSTEMI (non-ST elevated myocardial infarction) (HCC) 11/21/2021   Acute metabolic encephalopathy 11/21/2021   DNR (do not resuscitate)/DNI(Do Not Intubate) 11/20/2021   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) 07/25/2021   Schizophrenia, chronic condition with acute exacerbation (HCC) 07/20/2021    Orientation RESPIRATION BLADDER Height & Weight      (Fluctuates)  Normal Continent Weight: 189 lb 2.5 oz (85.8 kg) Height:  5\' 8"  (172.7 cm)  BEHAVIORAL SYMPTOMS/MOOD NEUROLOGICAL BOWEL NUTRITION STATUS   (Cooperative)  (TBI) Continent Diet (Regular)  AMBULATORY STATUS COMMUNICATION OF NEEDS Skin   Independent Verbally Skin abrasions                       Personal Care Assistance Level of Assistance              Functional Limitations Info  Sight, Hearing, Speech Sight Info: Adequate Hearing Info:  Adequate Speech Info: Adequate    SPECIAL CARE FACTORS FREQUENCY   Ambulatory  Independent                  Contractures Contractures Info: Not present    Additional Factors Info  Code Status, Allergies, Psychotropic Code Status Info: Full code Allergies Info: Morphine and related, Nsaids Psychotropic Info: Schizophrenia         Current Medications (05/29/2022):  This is the current hospital active medication list Current Facility-Administered Medications  Medication Dose Route Frequency Provider Last Rate Last Admin   acetaminophen (TYLENOL) tablet 650 mg  650 mg Oral Q6H PRN [name], [name], MD   650 mg at 05/22/22 2054   alum & mag hydroxide-simeth (MAALOX/MYLANTA) 200-200-20 MG/5ML suspension 30 mL  30 mL Oral Q4H PRN [name], [name], MD   30 mL at 04/26/22 04/28/22   aspirin EC tablet 81 mg  81 mg Oral Daily [name], 3016, MD   81 mg at 05/29/22 0935   atorvastatin (LIPITOR) tablet 20 mg  20 mg Oral Daily [name], [name], MD   20 mg at 05/29/22 0935   atropine 1 % ophthalmic solution 1 drop  1 drop Sublingual QID 05/31/22, MD   1 drop at 05/29/22 1212   benztropine (COGENTIN) tablet 0.5 mg  0.5 mg Oral BID [name], [name], MD   0.5 mg at 05/29/22 0935   clonazePAM (KLONOPIN) tablet 1 mg  1 mg Oral TID PRN He, [name], MD  1 mg at 05/25/22 1835   cloZAPine (CLOZARIL) tablet 300 mg  300 mg Oral QHS [name], [name], MD   300 mg at 05/28/22 2102   diphenhydrAMINE (BENADRYL) capsule 50 mg  50 mg Oral Q6H PRN [name], [name], MD   50 mg at 05/12/22 1158   Or   diphenhydrAMINE (BENADRYL) injection 50 mg  50 mg Intramuscular Q6H PRN [name], [name], MD       gabapentin (NEURONTIN) capsule 300 mg  300 mg Oral TID [name], [name], MD   300 mg at 05/29/22 1211   haloperidol (HALDOL) tablet 5 mg  5 mg Oral TID [name], [name], MD   5 mg at 05/29/22 1211   haloperidol (HALDOL) tablet 5 mg  5 mg Oral Q6H PRN [name], [name], MD   5 mg at 05/03/22 1813   Or   haloperidol lactate  (HALDOL) injection 5 mg  5 mg Intramuscular Q6H PRN [name], [name], MD       magnesium hydroxide (MILK OF MAGNESIA) suspension 30 mL  30 mL Oral Daily PRN [name], [name], MD   30 mL at 05/02/22 2841   metoprolol succinate (TOPROL-XL) 24 hr tablet 25 mg  25 mg Oral Daily [name], [name], MD   25 mg at 05/29/22 0935   paliperidone (INVEGA SUSTENNA) injection 156 mg  156 mg Intramuscular Q28 days [name], [name], MD   156 mg at 05/24/22 1123   temazepam (RESTORIL) capsule 15 mg  15 mg Oral QHS [name], [name], MD   15 mg at 05/28/22 2102   ziprasidone (GEODON) injection 20 mg  20 mg Intramuscular Q12H PRN [name], [name], MD   20 mg at 05/03/22 0103     Discharge Medications: Please see discharge summary for a list of discharge medications.  Relevant Imaging Results:  Relevant Lab Results:   Additional Information SS#: [id number]  Margarito Liner, LCSW

## 2022-05-29 NOTE — Progress Notes (Signed)
D: Patient alert and oriented. Patient denies pain. Patient denies anxiety and depression. Patient denies SI/HI/AVH.  Patient frequently observed walking around the unit. Patient interactive with staff. Patient did go out to the courtyard briefly when patient were given the opportunity.   A: Scheduled medications administered to patient, per MD orders.  Support and encouragement provided to patient.  Q15 minute safety checks maintained.   R: Patient compliant with medication administration and treatment plan. No adverse drug reactions noted. Patient remains safe on the unit at this time.

## 2022-05-29 NOTE — Plan of Care (Signed)
  Problem: Education: Goal: Ability to state activities that reduce stress will improve Outcome: Progressing   Problem: Self-Concept: Goal: Ability to identify factors that promote anxiety will improve Outcome: Progressing Goal: Level of anxiety will decrease Outcome: Progressing   Problem: Education: Goal: Knowledge of General Education information will improve Description: Including pain rating scale, medication(s)/side effects and non-pharmacologic comfort measures Outcome: Progressing

## 2022-05-30 NOTE — Progress Notes (Signed)
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 No pertinent surgical history. Family History: History reviewed. No pertinent family history. Family Psychiatric  History: None Social History:  Social History   Substance and Sexual Activity  Alcohol Use Not Currently     Social History   Substance and Sexual Activity  Drug Use Not Currently    Social History   Socioeconomic History   Marital status: Single    Spouse name: Not on file   Number of children: Not on file   Years of education: Not on file   Highest education level: Not on file  Occupational History   Not on file  Tobacco Use   Smoking status: Never    Passive exposure: Never   Smokeless tobacco: Never  Vaping Use   Vaping Use: Unknown  Substance and Sexual Activity   Alcohol use: Not Currently   Drug use: Not Currently   Sexual activity: Not Currently  Other Topics Concern   Not on file  Social History Narrative   Not on file   Social Determinants of Health   Financial Resource Strain: Not on file  Food Insecurity: Not on file  Transportation Needs: Not on file  Physical Activity: Not on file  Stress: Not on file  Social Connections: Not on file    Additional Social History:  Specify valuables returned:  (none)                      Sleep: Fair  Appetite:  Fair  Current Medications: Current Facility-Administered Medications  Medication Dose Route Frequency Provider Last Rate Last Admin   acetaminophen (TYLENOL) tablet 650 mg  650 mg Oral Q6H PRN Ichael Pullara T, MD   650 mg at 05/22/22 2054   alum & mag hydroxide-simeth (MAALOX/MYLANTA) 200-200-20 MG/5ML suspension 30 mL  30 mL Oral Q4H PRN Chairty Toman T, MD   30 mL at 04/26/22 1324   aspirin EC tablet 81 mg  81 mg Oral Daily Jshon Ibe, [name], MD   81 mg at 05/30/22 0820   atorvastatin (LIPITOR) tablet 20 mg  20 mg Oral Daily Laycie Schriner T, MD   20 mg at 05/30/22 0820   atropine 1 % ophthalmic solution 1 drop  1 drop Sublingual QID [name], MD   1 drop at 05/30/22 1645   benztropine (COGENTIN) tablet 0.5 mg  0.5 mg Oral BID Guiseppe Flanagan T, MD   0.5 mg at 05/30/22 1644   clonazePAM (KLONOPIN) tablet 1 mg  1 mg Oral TID PRN He, [name], MD   1 mg at 05/25/22 1835   cloZAPine (CLOZARIL) tablet 300 mg  300 mg Oral QHS Aodhan Scheidt T, MD   300 mg at 05/29/22 2106   diphenhydrAMINE (BENADRYL) capsule 50 mg  50  mg Oral Q6H PRN Nadalyn Deringer T, MD   50 mg at 05/12/22 1158   Or   diphenhydrAMINE (BENADRYL) injection 50 mg  50 mg Intramuscular Q6H PRN Daryle Boyington T, MD       gabapentin (NEURONTIN) capsule 300 mg  300 mg Oral TID Kristia Jupiter T, MD   300 mg at 05/30/22 1644   haloperidol (HALDOL) tablet 5 mg  5 mg Oral TID Laine Giovanetti, [name], MD   5 mg at 05/30/22 1645   haloperidol (HALDOL) tablet 5 mg  5 mg Oral Q6H PRN Caron Ode T, MD   5 mg at 05/03/22 1813   Or   haloperidol lactate (HALDOL) injection 5 mg  5 mg Intramuscular Q6H PRN Damonie Ellenwood T, MD       magnesium hydroxide (MILK OF MAGNESIA) suspension 30 mL  30 mL Oral Daily PRN Emerald Shor T, MD   30 mL at 05/02/22 9937   metoprolol succinate (TOPROL-XL) 24 hr tablet 25 mg  25 mg Oral Daily Seanmichael Salmons T,  MD   25 mg at 05/30/22 0820   paliperidone (INVEGA SUSTENNA) injection 156 mg  156 mg Intramuscular Q28 days Jeaneen Cala T, MD   156 mg at 05/24/22 1123   temazepam (RESTORIL) capsule 15 mg  15 mg Oral QHS Ondrea Dow T, MD   15 mg at 05/29/22 2107   ziprasidone (GEODON) injection 20 mg  20 mg Intramuscular Q12H PRN Qusai Kem, [name], MD   20 mg at 05/03/22 0103    Lab Results: No results found for this or any previous visit (from the past 48 hour(s)).  Blood Alcohol level:  Lab Results  Component Value Date   ETH <10 07/20/2021    Metabolic Disorder Labs: Lab Results  Component Value Date   HGBA1C 5.5 04/10/2022   MPG 111.15 04/10/2022   No results found for: "PROLACTIN" Lab Results  Component Value Date   CHOL 167 11/20/2021   TRIG 107 11/20/2021   HDL 26 (L) 11/20/2021   CHOLHDL 6.4 11/20/2021   VLDL 21 11/20/2021   LDLCALC 120 (H) 11/20/2021    Physical Findings: AIMS: Facial and Oral Movements Muscles of Facial Expression: None, normal Lips and Perioral Area: None, normal Jaw: None, normal Tongue: None, normal,Extremity Movements Upper (arms, wrists, hands, fingers): None, normal Lower (legs, knees, ankles, toes): None, normal, Trunk Movements Neck, shoulders, hips: None, normal, Overall Severity Severity of abnormal movements (highest score from questions above): None, normal Incapacitation due to abnormal movements: None, normal Patient's awareness of abnormal movements (rate only patient's report): No Awareness, Dental Status Current problems with teeth and/or dentures?: No Does patient usually wear dentures?: No  CIWA:    COWS:     Musculoskeletal: Strength & Muscle Tone: within normal limits Gait & Station: normal Patient leans: N/A  Psychiatric Specialty Exam:  Presentation  General Appearance: Appropriate for Environment; Casual; Neat; Well Groomed  Eye Contact:Fair  Speech:Slow  Speech Volume:Decreased  Handedness:Left   Mood and  Affect  Mood:Anxious  Affect:Congruent   Thought Process  Thought Processes:Goal Directed  Descriptions of Associations:Circumstantial  Orientation:Partial  Thought Content:Scattered  History of Schizophrenia/Schizoaffective disorder:Yes  Duration of Psychotic Symptoms:Greater than six months  Hallucinations:No data recorded Ideas of Reference:None  Suicidal Thoughts:No data recorded Homicidal Thoughts:No data recorded  Sensorium  Memory:Remote Good  Judgment:Fair  Insight:Fair   Executive Functions  Concentration:Fair  Attention Span:Fair  Recall:Fair  Fund of Knowledge:Fair  Language:Fair   Psychomotor Activity  Psychomotor Activity:No data recorded  Assets  Assets:Housing; Resilience; Social Support; Talents/Skills   Sleep  Sleep:No data recorded   Physical Exam: Physical Exam Vitals and nursing note reviewed.  Constitutional:      Appearance: Normal appearance.  HENT:     Head: Normocephalic and atraumatic.     Mouth/Throat:     Pharynx: Oropharynx is clear.  Eyes:     Pupils: Pupils are equal, round, and reactive to light.  Cardiovascular:     Rate and Rhythm: Normal rate and regular rhythm.  Pulmonary:     Effort: Pulmonary effort is normal.     Breath sounds: Normal breath sounds.  Abdominal:     General: Abdomen is flat.     Palpations: Abdomen is soft.  Musculoskeletal:        General: Normal range of motion.  Skin:    General: Skin is warm and dry.  Neurological:     General: No focal deficit present.     Mental Status: He is alert. Mental status is at baseline.  Psychiatric:        Attention and Perception: He is inattentive.        Mood and Affect: Mood normal. Affect is inappropriate.        Speech: He is noncommunicative.    Review of Systems  Unable to perform ROS: Language   Blood pressure 127/87, pulse 98, temperature 97.7 F (36.5 C), temperature source Oral, resp. rate 18, height 5\' 8"  (1.727 m), weight  85.8 kg, SpO2 99 %. Body mass index is 28.76 kg/m.   Treatment Plan Summary: Plan no change to plan.  Waiting for placement.  , MD 05/30/2022, 4:56 PM

## 2022-05-30 NOTE — Plan of Care (Signed)
Pt denies depression, anxiety, SI, HI and AVH. Pt was educated on care plan and verbalizes understanding. Torrie Mayers RN Problem: Education: Goal: Ability to state activities that reduce stress will improve Outcome: Progressing   Problem: Coping: Goal: Ability to identify and develop effective coping behavior will improve Outcome: Progressing   Problem: Self-Concept: Goal: Ability to identify factors that promote anxiety will improve Outcome: Progressing Goal: Level of anxiety will decrease Outcome: Progressing Goal: Ability to modify response to factors that promote anxiety will improve Outcome: Progressing   Problem: Education: Goal: Knowledge of General Education information will improve Description: Including pain rating scale, medication(s)/side effects and non-pharmacologic comfort measures Outcome: Progressing   Problem: Health Behavior/Discharge Planning: Goal: Ability to manage health-related needs will improve Outcome: Progressing   Problem: Clinical Measurements: Goal: Ability to maintain clinical measurements within normal limits will improve Outcome: Progressing Goal: Will remain free from infection Outcome: Progressing Goal: Diagnostic test results will improve Outcome: Progressing Goal: Respiratory complications will improve Outcome: Progressing Goal: Cardiovascular complication will be avoided Outcome: Progressing   Problem: Activity: Goal: Risk for activity intolerance will decrease Outcome: Progressing   Problem: Nutrition: Goal: Adequate nutrition will be maintained Outcome: Progressing   Problem: Coping: Goal: Level of anxiety will decrease Outcome: Progressing   Problem: Elimination: Goal: Will not experience complications related to bowel motility Outcome: Progressing Goal: Will not experience complications related to urinary retention Outcome: Progressing   Problem: Pain Managment: Goal: General experience of comfort will  improve Outcome: Progressing   Problem: Safety: Goal: Ability to remain free from injury will improve Outcome: Progressing   Problem: Skin Integrity: Goal: Risk for impaired skin integrity will decrease Outcome: Progressing

## 2022-05-30 NOTE — Plan of Care (Signed)
  Problem: Education: Goal: Ability to state activities that reduce stress will improve Outcome: Progressing   Problem: Coping: Goal: Ability to identify and develop effective coping behavior will improve Outcome: Progressing   Problem: Education: Goal: Knowledge of General Education information will improve Description: Including pain rating scale, medication(s)/side effects and non-pharmacologic comfort measures Outcome: Progressing   Problem: Skin Integrity: Goal: Risk for impaired skin integrity will decrease Outcome: Progressing   Problem: Pain Managment: Goal: General experience of comfort will improve Outcome: Progressing

## 2022-05-30 NOTE — Progress Notes (Signed)
Pt has been calm, cooperative and med compliant. He has been pleasant and assertive. Torrie Mayers RN

## 2022-05-30 NOTE — Progress Notes (Signed)
  Patient at the nurses station requesting to be shaved. Patient shaved as requested.  In a pleasant and cooperative mood this evening. He reports having a good day today.      C Butler-Nicholson,LPN

## 2022-05-30 NOTE — Plan of Care (Signed)
  Problem: Education: Goal: Ability to state activities that reduce stress will improve Outcome: Progressing   Problem: Coping: Goal: Ability to identify and develop effective coping behavior will improve Outcome: Progressing   Problem: Self-Concept: Goal: Ability to identify factors that promote anxiety will improve Outcome: Progressing Goal: Level of anxiety will decrease Outcome: Progressing Goal: Ability to modify response to factors that promote anxiety will improve Outcome: Progressing   Problem: Education: Goal: Knowledge of General Education information will improve Description: Including pain rating scale, medication(s)/side effects and non-pharmacologic comfort measures Outcome: Progressing   Problem: Health Behavior/Discharge Planning: Goal: Ability to manage health-related needs will improve Outcome: Progressing   Problem: Clinical Measurements: Goal: Ability to maintain clinical measurements within normal limits will improve Outcome: Progressing Goal: Will remain free from infection Outcome: Progressing Goal: Diagnostic test results will improve Outcome: Progressing Goal: Respiratory complications will improve Outcome: Progressing Goal: Cardiovascular complication will be avoided Outcome: Progressing   Problem: Skin Integrity: Goal: Risk for impaired skin integrity will decrease Outcome: Progressing   Problem: Safety: Goal: Ability to remain free from injury will improve Outcome: Progressing   Problem: Pain Managment: Goal: General experience of comfort will improve Outcome: Progressing

## 2022-05-30 NOTE — TOC Progression Note (Signed)
Transition of Care [facility]) - Progression Note    Patient Details  Name: Adam [name] MRN: [id number] Date of Birth: 15-Aug-1961  Transition of Care [facility]) CM/SW Contact  Margarito Liner, LCSW Phone Number: 05/30/2022, 1:11 PM  Clinical Narrative:   Faxed referral to [facility].  Expected Discharge Plan and Services                                                 Social Determinants of Health (SDOH) Interventions    Readmission Risk Interventions     No data to display

## 2022-05-31 NOTE — Plan of Care (Signed)
D: Pt alert and oriented. Pt denies experiencing any anxiety/depression at this time. Pt reports energy level as low and concentration as being good. Pt reports sleep last night as being good. Pt did not receive medications for sleep. Pt denies experiencing any pain at this time. Pt denies experiencing any SI/HI, or AVH at this time.   Pt observed as frustrated at times when having difficulty communicating.    A: Scheduled medications administered to pt, per MD orders. Support and encouragement provided. Frequent verbal contact made. Routine safety checks conducted q15 minutes.   R: No adverse drug reactions noted. Pt verbally contracts for safety at this time. Pt compliant with medications and treatment plan. Pt interacts well with others on the unit. Pt remains safe at this time. Will continue to monitor.   Problem: Self-Concept: Goal: Level of anxiety will decrease Outcome: Progressing   Problem: Education: Goal: Knowledge of General Education information will improve Description: Including pain rating scale, medication(s)/side effects and non-pharmacologic comfort measures Outcome: Progressing

## 2022-05-31 NOTE — Progress Notes (Signed)
Recreation Therapy Notes  Date: 05/31/2022  Time: 10:45 am     Location: Court yard   Behavioral response: Appropriate  Intervention Topic:  Social Skills   Discussion/Intervention:  Group content on today was focused on Pharmacist, community. The group defined social skills and identified ways they use social skills. Patients expressed what obstacles they face when trying to be social. Participants described the importance of social skills. The group listed ways to improve social skills and reasons to improve social skills. Individuals had an opportunity to learn new and improve social skills as well as identify their weaknesses. Clinical Observations/Feedback: Patient came to group and sat in the corner to himself.   Elodie Panameno LRT/CTRS         Roann Merk 05/31/2022 12:31 PM

## 2022-05-31 NOTE — Progress Notes (Signed)
SLP Follow Up Note  Patient Details Name: Adam [name] MRN: [id number] DOB: 17-Aug-1961  The trial period for the [employer] TouchTalk has expired. At this time, don't recommend requesting permanent device for pt as his not using device for communicative purposes.   Pt's team to retrieve device from pt and SLP to return device to [employer].   ST services will sign off.   Ruweyda Macknight B. Dreama Saa, M.S., CCC-SLP, CBIS Speech-Language Pathologist Certified Brain Injury Specialist [facility]  [facility] [phone] Ascom [phone] Fax [phone]   Shaylah Mcghie Dreama Saa 05/31/2022, 1:24 PM

## 2022-05-31 NOTE — Progress Notes (Signed)
[facility] MD Progress Note  05/31/2022 1:42 PM Adam [name]  MRN:  [id number] Subjective: No new complaint Principal Problem: Difficulty controlling behavior as late effect of traumatic brain injury [facility]) Diagnosis: Principal Problem:   Difficulty controlling behavior as late effect of traumatic brain injury 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 No pertinent surgical history. Family History: History reviewed. No pertinent family history. Family Psychiatric  History: None known Social History:  Social History   Substance and Sexual Activity  Alcohol Use Not Currently     Social History   Substance and Sexual Activity  Drug Use Not Currently    Social History   Socioeconomic History   Marital status: Single    Spouse name: Not on file   Number of children: Not on file   Years of education: Not on file   Highest education level: Not on file  Occupational History   Not on file  Tobacco Use   Smoking status: Never    Passive exposure: Never   Smokeless tobacco: Never  Vaping Use   Vaping Use: Unknown  Substance and Sexual Activity   Alcohol use: Not Currently   Drug use: Not Currently   Sexual activity: Not Currently  Other Topics Concern   Not on file  Social History Narrative   Not on file   Social Determinants of Health   Financial Resource Strain: Not on file  Food Insecurity: Not on file  Transportation Needs: Not on file  Physical Activity: Not on file  Stress: Not on file  Social Connections: Not on file   Additional Social History:   Specify valuables returned:  (none)                      Sleep: Fair  Appetite:  Fair  Current Medications: Current Facility-Administered Medications  Medication Dose Route Frequency Provider Last Rate Last Admin   acetaminophen (TYLENOL) tablet 650 mg  650 mg Oral Q6H PRN Tangala Wiegert T, MD   650 mg at 05/22/22 2054   alum & mag hydroxide-simeth (MAALOX/MYLANTA) 200-200-20 MG/5ML suspension 30 mL  30 mL Oral Q4H PRN Duey Liller T, MD   30 mL at 04/26/22 1950   aspirin EC tablet 81 mg  81 mg Oral Daily Segundo Makela, [name], MD   81 mg at 05/31/22 0826   atorvastatin (LIPITOR) tablet 20 mg  20 mg Oral Daily Drayden Lukas T, MD   20 mg at 05/31/22 0826   atropine 1 % ophthalmic solution 1 drop  1 drop Sublingual QID [name], MD   1 drop at 05/31/22 1213   benztropine (COGENTIN) tablet 0.5 mg  0.5 mg Oral BID Nevada Mullett T, MD   0.5 mg at 05/31/22 0826   clonazePAM (KLONOPIN) tablet 1 mg  1 mg Oral TID PRN He, [name], MD   1 mg at 05/25/22 1835   cloZAPine (CLOZARIL) tablet 300 mg  300 mg Oral QHS Marios Gaiser T, MD   300 mg at 05/30/22 2102   diphenhydrAMINE (BENADRYL) capsule 50 mg  50 mg Oral Q6H  PRN Quenna Doepke, [name], MD   50 mg at 05/12/22 1158   Or   diphenhydrAMINE (BENADRYL) injection 50 mg  50 mg Intramuscular Q6H PRN Raiana Pharris T, MD       gabapentin (NEURONTIN) capsule 300 mg  300 mg Oral TID Jordann Grime T, MD   300 mg at 05/31/22 1213   haloperidol (HALDOL) tablet 5 mg  5 mg Oral TID Auri Jahnke T, MD   5 mg at 05/31/22 1213   haloperidol (HALDOL) tablet 5 mg  5 mg Oral Q6H PRN Aydan Phoenix T, MD   5 mg at 05/03/22 1813   Or   haloperidol lactate (HALDOL) injection 5 mg  5 mg Intramuscular Q6H PRN Doha Boling T, MD       magnesium hydroxide (MILK OF MAGNESIA) suspension 30 mL  30 mL Oral Daily PRN Derian Pfost T, MD   30 mL at 05/02/22 0947   metoprolol succinate (TOPROL-XL) 24 hr tablet 25 mg  25 mg Oral Daily Fuller Makin T, MD   25 mg at 05/31/22 0830    paliperidone (INVEGA SUSTENNA) injection 156 mg  156 mg Intramuscular Q28 days Shamyia Grandpre T, MD   156 mg at 05/24/22 1123   temazepam (RESTORIL) capsule 15 mg  15 mg Oral QHS Rickell Wiehe T, MD   15 mg at 05/30/22 2102   ziprasidone (GEODON) injection 20 mg  20 mg Intramuscular Q12H PRN Sydnee Lamour, [name], MD   20 mg at 05/03/22 0103    Lab Results: No results found for this or any previous visit (from the past 48 hour(s)).  Blood Alcohol level:  Lab Results  Component Value Date   ETH <10 07/20/2021    Metabolic Disorder Labs: Lab Results  Component Value Date   HGBA1C 5.5 04/10/2022   MPG 111.15 04/10/2022   No results found for: "PROLACTIN" Lab Results  Component Value Date   CHOL 167 11/20/2021   TRIG 107 11/20/2021   HDL 26 (L) 11/20/2021   CHOLHDL 6.4 11/20/2021   VLDL 21 11/20/2021   LDLCALC 120 (H) 11/20/2021    Physical Findings: AIMS: Facial and Oral Movements Muscles of Facial Expression: None, normal Lips and Perioral Area: None, normal Jaw: None, normal Tongue: None, normal,Extremity Movements Upper (arms, wrists, hands, fingers): None, normal Lower (legs, knees, ankles, toes): None, normal, Trunk Movements Neck, shoulders, hips: None, normal, Overall Severity Severity of abnormal movements (highest score from questions above): None, normal Incapacitation due to abnormal movements: None, normal Patient's awareness of abnormal movements (rate only patient's report): No Awareness, Dental Status Current problems with teeth and/or dentures?: No Does patient usually wear dentures?: No  CIWA:    COWS:     Musculoskeletal: Strength & Muscle Tone: within normal limits Gait & Station: normal Patient leans: N/A  Psychiatric Specialty Exam:  Presentation  General Appearance: Appropriate for Environment; Casual; Neat; Well Groomed  Eye Contact:Fair  Speech:Slow  Speech Volume:Decreased  Handedness:Left   Mood and Affect   Mood:Anxious  Affect:Congruent   Thought Process  Thought Processes:Goal Directed  Descriptions of Associations:Circumstantial  Orientation:Partial  Thought Content:Scattered  History of Schizophrenia/Schizoaffective disorder:Yes  Duration of Psychotic Symptoms:Greater than six months  Hallucinations:No data recorded Ideas of Reference:None  Suicidal Thoughts:No data recorded Homicidal Thoughts:No data recorded  Sensorium  Memory:Remote Good  Judgment:Fair  Insight:Fair   Executive Functions  Concentration:Fair  Attention Span:Fair  Recall:Fair  Fund of Knowledge:Fair  Language:Fair   Psychomotor Activity  Psychomotor Activity:No data recorded  Assets  Assets:Housing; Resilience; Social Support; Talents/Skills   Sleep  Sleep:No data recorded   Physical Exam: Physical Exam Vitals and nursing note reviewed.  Constitutional:      Appearance: Normal appearance.  HENT:     Head: Normocephalic and atraumatic.     Mouth/Throat:     Pharynx: Oropharynx is clear.  Eyes:     Pupils: Pupils are equal, round, and reactive to light.  Cardiovascular:     Rate and Rhythm: Normal rate and regular rhythm.  Pulmonary:     Effort: Pulmonary effort is normal.     Breath sounds: Normal breath sounds.  Abdominal:     General: Abdomen is flat.     Palpations: Abdomen is soft.  Musculoskeletal:        General: Normal range of motion.  Skin:    General: Skin is warm and dry.  Neurological:     General: No focal deficit present.     Mental Status: He is alert. Mental status is at baseline.  Psychiatric:        Attention and Perception: He is inattentive.        Mood and Affect: Affect is inappropriate.        Speech: He is noncommunicative.    Review of Systems  Unable to perform ROS: Language   Blood pressure 115/85, pulse 99, temperature (!) 97.5 F (36.4 C), temperature source Oral, resp. rate 17, height 5\' 8"  (1.727 m), weight 85.8 kg, SpO2 100  %. Body mass index is 28.76 kg/m.   Treatment Plan Summary: Plan chronic impairment currently stable no new complaints.  No indication for any change in current treatment.  Patient is entirely waiting for placement.  , MD 05/31/2022, 1:42 PM

## 2022-05-31 NOTE — Progress Notes (Signed)
Patient Received his qhs meds tonight without incident.  Monitored with q15 minute safety checks [place] remains safe on the unit.    C Butler-Nicholson, LPN

## 2022-06-01 NOTE — Plan of Care (Signed)
  Problem: Education: Goal: Ability to state activities that reduce stress will improve Outcome: Progressing   Problem: Coping: Goal: Ability to identify and develop effective coping behavior will improve Outcome: Progressing   Problem: Self-Concept: Goal: Ability to identify factors that promote anxiety will improve Outcome: Progressing Goal: Level of anxiety will decrease Outcome: Progressing Goal: Ability to modify response to factors that promote anxiety will improve Outcome: Progressing   Problem: Skin Integrity: Goal: Risk for impaired skin integrity will decrease Outcome: Progressing   Problem: Safety: Goal: Ability to remain free from injury will improve Outcome: Progressing   Problem: Pain Managment: Goal: General experience of comfort will improve Outcome: Progressing   Problem: Elimination: Goal: Will not experience complications related to bowel motility Outcome: Progressing Goal: Will not experience complications related to urinary retention Outcome: Progressing

## 2022-06-01 NOTE — Progress Notes (Signed)
Adam [name] has been active on the unit. Shuffling through the halls and hanging out in the dayroom.  He reports having a good day today.  He reports eating and sleeping well.  He is med compliant and received his meds without incident.  Will continue to monitor, encourage and support. !5 minute safety checks in place.      C Butler-Nicholson, LPN

## 2022-06-01 NOTE — Progress Notes (Signed)
[facility] MD Progress Note  06/01/2022 3:27 PM Adam [name]  MRN:  [id number] Subjective: Follow-up 61 year old man with schizophrenia and history of strokes.  No change.  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 No pertinent surgical history. Family History: History reviewed. No pertinent family history. Family Psychiatric  History: No change Social History:  Social History   Substance and Sexual Activity  Alcohol Use Not Currently     Social History   Substance and Sexual Activity  Drug Use Not Currently    Social History   Socioeconomic History   Marital status: Single    Spouse name: Not on file   Number of children: Not on file   Years of education: Not on file   Highest education level: Not on file  Occupational History   Not on file  Tobacco Use   Smoking status: Never    Passive exposure: Never   Smokeless tobacco: Never  Vaping Use   Vaping Use: Unknown  Substance and Sexual Activity   Alcohol use: Not Currently   Drug use: Not Currently   Sexual activity: Not Currently  Other Topics Concern   Not on file  Social History Narrative   Not on file   Social Determinants of Health   Financial Resource Strain: Not on file  Food  Insecurity: Not on file  Transportation Needs: Not on file  Physical Activity: Not on file  Stress: Not on file  Social Connections: Not on file   Additional Social History:  Specify valuables returned:  (none)                      Sleep: Fair  Appetite:  Fair  Current Medications: Current Facility-Administered Medications  Medication Dose Route Frequency Provider Last Rate Last Admin   acetaminophen (TYLENOL) tablet 650 mg  650 mg Oral Q6H PRN Juel Bellerose T, MD   650 mg at 05/31/22 1828   alum & mag hydroxide-simeth (MAALOX/MYLANTA) 200-200-20 MG/5ML suspension 30 mL  30 mL Oral Q4H PRN Seniya Stoffers T, MD   30 mL at 04/26/22 1950   aspirin EC tablet 81 mg  81 mg Oral Daily Abree Romick T, MD   81 mg at 06/01/22 0809   atorvastatin (LIPITOR) tablet 20 mg  20 mg Oral Daily Trysten Berti T, MD   20 mg at 06/01/22 0809   atropine 1 % ophthalmic solution 1 drop  1 drop Sublingual QID [name], MD   1 drop at 06/01/22 1140   benztropine (COGENTIN) tablet 0.5 mg  0.5 mg Oral BID Keara Pagliarulo T, MD   0.5 mg at 06/01/22 0809   clonazePAM (KLONOPIN) tablet 1 mg  1 mg Oral TID PRN He, [name], MD   1 mg at 05/25/22 1835  cloZAPine (CLOZARIL) tablet 300 mg  300 mg Oral QHS Kolyn Rozario T, MD   300 mg at 05/31/22 2133   diphenhydrAMINE (BENADRYL) capsule 50 mg  50 mg Oral Q6H PRN Cayden Granholm T, MD   50 mg at 05/12/22 1158   Or   diphenhydrAMINE (BENADRYL) injection 50 mg  50 mg Intramuscular Q6H PRN Briscoe Daniello T, MD       gabapentin (NEURONTIN) capsule 300 mg  300 mg Oral TID Sherran Margolis T, MD   300 mg at 06/01/22 1140   haloperidol (HALDOL) tablet 5 mg  5 mg Oral TID Takeem Krotzer T, MD   5 mg at 06/01/22 1140   haloperidol (HALDOL) tablet 5 mg  5 mg Oral Q6H PRN Arnoldo Hildreth T, MD   5 mg at 05/03/22 1813   Or   haloperidol lactate (HALDOL) injection 5 mg  5 mg Intramuscular Q6H PRN Vinicio Lynk T, MD       magnesium hydroxide (MILK OF MAGNESIA) suspension 30 mL  30 mL  Oral Daily PRN Mekaylah Klich T, MD   30 mL at 05/02/22 4818   metoprolol succinate (TOPROL-XL) 24 hr tablet 25 mg  25 mg Oral Daily Lugene Beougher T, MD   25 mg at 06/01/22 0810   paliperidone (INVEGA SUSTENNA) injection 156 mg  156 mg Intramuscular Q28 days Yuvraj Pfeifer T, MD   156 mg at 05/24/22 1123   temazepam (RESTORIL) capsule 15 mg  15 mg Oral QHS Romuald Mccaslin T, MD   15 mg at 05/31/22 2133   ziprasidone (GEODON) injection 20 mg  20 mg Intramuscular Q12H PRN Rhianne Soman, [name], MD   20 mg at 05/03/22 0103    Lab Results: No results found for this or any previous visit (from the past 48 hour(s)).  Blood Alcohol level:  Lab Results  Component Value Date   ETH <10 07/20/2021    Metabolic Disorder Labs: Lab Results  Component Value Date   HGBA1C 5.5 04/10/2022   MPG 111.15 04/10/2022   No results found for: "PROLACTIN" Lab Results  Component Value Date   CHOL 167 11/20/2021   TRIG 107 11/20/2021   HDL 26 (L) 11/20/2021   CHOLHDL 6.4 11/20/2021   VLDL 21 11/20/2021   LDLCALC 120 (H) 11/20/2021    Physical Findings: AIMS: Facial and Oral Movements Muscles of Facial Expression: None, normal Lips and Perioral Area: None, normal Jaw: None, normal Tongue: None, normal,Extremity Movements Upper (arms, wrists, hands, fingers): None, normal Lower (legs, knees, ankles, toes): None, normal, Trunk Movements Neck, shoulders, hips: None, normal, Overall Severity Severity of abnormal movements (highest score from questions above): None, normal Incapacitation due to abnormal movements: None, normal Patient's awareness of abnormal movements (rate only patient's report): No Awareness, Dental Status Current problems with teeth and/or dentures?: No Does patient usually wear dentures?: No  CIWA:    COWS:     Musculoskeletal: Strength & Muscle Tone: within normal limits Gait & Station: normal Patient leans: N/A  Psychiatric Specialty Exam:  Presentation  General Appearance:  Appropriate for Environment; Casual; Neat; Well Groomed  Eye Contact:Fair  Speech:Slow  Speech Volume:Decreased  Handedness:Left   Mood and Affect  Mood:Anxious  Affect:Congruent   Thought Process  Thought Processes:Goal Directed  Descriptions of Associations:Circumstantial  Orientation:Partial  Thought Content:Scattered  History of Schizophrenia/Schizoaffective disorder:Yes  Duration of Psychotic Symptoms:Greater than six months  Hallucinations:No data recorded Ideas of Reference:None  Suicidal Thoughts:No data recorded Homicidal Thoughts:No data recorded  Sensorium  Memory:Remote Good  Judgment:Fair  Insight:Fair   Executive Functions  Concentration:Fair  Attention Span:Fair  Recall:Fair  Fund of Knowledge:Fair  Language:Fair   Psychomotor Activity  Psychomotor Activity:No data recorded  Assets  Assets:Housing; Resilience; Social Support; Talents/Skills   Sleep  Sleep:No data recorded   Physical Exam: Physical Exam Vitals and nursing note reviewed.  Constitutional:      Appearance: Normal appearance.  HENT:     Head: Normocephalic and atraumatic.     Mouth/Throat:     Pharynx: Oropharynx is clear.  Eyes:     Pupils: Pupils are equal, round, and reactive to light.  Cardiovascular:     Rate and Rhythm: Normal rate and regular rhythm.  Pulmonary:     Effort: Pulmonary effort is normal.     Breath sounds: Normal breath sounds.  Abdominal:     General: Abdomen is flat.     Palpations: Abdomen is soft.  Musculoskeletal:        General: Normal range of motion.  Skin:    General: Skin is warm and dry.  Neurological:     General: No focal deficit present.     Mental Status: He is alert. Mental status is at baseline.  Psychiatric:        Attention and Perception: He is inattentive.        Mood and Affect: Mood normal. Affect is blunt.        Speech: He is noncommunicative.    ROS Blood pressure (!) 126/94, pulse 81,  temperature 97.7 F (36.5 C), temperature source Oral, resp. rate 17, height 5\' 8"  (1.727 m), weight 85.8 kg, SpO2 100 %. Body mass index is 28.76 kg/m.   Treatment Plan Summary: Medication management and Plan no change to medication management.  Supportive encouragement.  Full treatment team aware of his needs and working to take care of them.  Still needs placement.  , MD 06/01/2022, 3:27 PM

## 2022-06-01 NOTE — Plan of Care (Signed)
D: Pt alert and oriented. Pt rates depression 0/10, hopelessness 0/10, and anxiety 0/10. Pt goal: "[employer]". Pt reports energy level as normal and concentration as being good. Pt reports sleep last night as being good. Pt did receive medications for sleep and did find them helpful. Pt denies experiencing any pain at this time. Pt denies experiencing any SI/HI, or AVH at this time.   A: Scheduled medications administered to pt, per MD orders. Support and encouragement provided. Frequent verbal contact made. Routine safety checks conducted q15 minutes.   R: No adverse drug reactions noted. Pt verbally contracts for safety at this time. Pt compliant with medications and treatment plan. Pt interacts well with others on the unit. Pt remains safe at this time. Will continue to monitor.   Problem: Education: Goal: Ability to state activities that reduce stress will improve Outcome: Not Progressing   Problem: Coping: Goal: Ability to identify and develop effective coping behavior will improve Outcome: Not Progressing

## 2022-06-01 NOTE — Progress Notes (Signed)
Recreation Therapy Notes   Date: 06/01/2022  Time: 10:40 am    Location: Courtyard     Behavioral response: N/A   Intervention Topic: Wellness    Discussion/Intervention: Patient refused to attend group.   Clinical Observations/Feedback:  Patient refused to attend group.    Romain Erion LRT/CTRS        Vangie Henthorn 06/01/2022 11:58 AM 

## 2022-06-01 NOTE — Plan of Care (Signed)
°  Problem: Group Participation °Goal: STG - Patient will engage in groups with a calm and appropriate mood at least 2x within 5 recreation therapy group sessions °Description: STG - Patient will engage in groups with a calm and appropriate mood at least 2x within 5 recreation therapy group sessions °Outcome: Progressing °  °

## 2022-06-02 NOTE — Group Note (Signed)
LCSW Group Therapy Note   Group Date: 06/02/2022 Start Time: 1230 End Time: 1330   Type of Therapy and Topic:  Group Therapy: Challenging Core Beliefs  Participation Level:  Did Not Attend  Description of Group:  Patients were educated about core beliefs and asked to identify one harmful core belief that they have. Patients were asked to explore from where those beliefs originate. Patients were asked to discuss how those beliefs make them feel and the resulting behaviors of those beliefs. They were then be asked if those beliefs are true and, if so, what evidence they have to support them. Lastly, group members were challenged to replace those negative core beliefs with helpful beliefs.   Therapeutic Goals:   1. Patient will identify harmful core beliefs and explore the origins of such beliefs. 2. Patient will identify feelings and behaviors that result from those core beliefs. 3. Patient will discuss whether such beliefs are true. 4.  Patient will replace harmful core beliefs with helpful ones.  Summary of Patient Progress:  Did not attend  Therapeutic Modalities: Cognitive Behavioral Therapy; Solution-Focused Therapy   Otelia Santee, LCSW 06/02/2022  1:18 PM

## 2022-06-02 NOTE — Progress Notes (Signed)
Patient alert and oriented x 4 no distress noted he is interacting appropriately with peers and staff, no distress noted, he was complaint with medication this shift. 15 minutes safety checks maintained will closely monitor. 

## 2022-06-02 NOTE — Progress Notes (Signed)
[facility] MD Progress Note  06/02/2022 10:43 AM Adam [name]  MRN:  [id number] Subjective: Follow-up for this gentleman with schizophrenia and aphasia.  Patient is calmer today.  It was discovered yesterday afternoon that he was upset because his phone charger was not working.  Now that that problem has been resolved he is feeling much better.  Affect euthymic.  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 No pertinent surgical history. Family History: History reviewed. No pertinent family history. Family Psychiatric  History: See previous Social History:  Social History   Substance and Sexual Activity  Alcohol Use Not Currently     Social History   Substance and Sexual Activity  Drug Use Not Currently    Social History   Socioeconomic History   Marital status: Single    Spouse name: Not on file   Number of children: Not on file   Years of education: Not on file   Highest education level: Not on file  Occupational History   Not on file  Tobacco Use   Smoking status: Never    Passive exposure: Never   Smokeless tobacco: Never  Vaping Use   Vaping Use: Unknown  Substance and Sexual Activity   Alcohol use: Not Currently   Drug use: Not Currently   Sexual activity: Not Currently  Other Topics Concern   Not on file  Social History Narrative   Not on  file   Social Determinants of Health   Financial Resource Strain: Not on file  Food Insecurity: Not on file  Transportation Needs: Not on file  Physical Activity: Not on file  Stress: Not on file  Social Connections: Not on file   Additional Social History:  Specify valuables returned:  (none)                      Sleep: Fair  Appetite:  Fair  Current Medications: Current Facility-Administered Medications  Medication Dose Route Frequency Provider Last Rate Last Admin   acetaminophen (TYLENOL) tablet 650 mg  650 mg Oral Q6H PRN Keary Hanak T, MD   650 mg at 05/31/22 1828   alum & mag hydroxide-simeth (MAALOX/MYLANTA) 200-200-20 MG/5ML suspension 30 mL  30 mL Oral Q4H PRN Quadre Bristol T, MD   30 mL at 04/26/22 2426   aspirin EC tablet 81 mg  81 mg Oral Daily Denis Koppel T, MD   81 mg at 06/02/22 0740   atorvastatin (LIPITOR) tablet 20 mg  20 mg Oral Daily Auna Mikkelsen T, MD   20 mg at 06/02/22 0740   atropine 1 % ophthalmic solution 1 drop  1 drop Sublingual QID [name], MD   1 drop at 06/02/22 0740   benztropine (COGENTIN) tablet 0.5 mg  0.5 mg Oral BID Laithan Conchas T, MD   0.5 mg at 06/02/22 0740   clonazePAM (KLONOPIN) tablet 1 mg  1 mg Oral TID PRN  He, [name], MD   1 mg at 05/25/22 1835   cloZAPine (CLOZARIL) tablet 300 mg  300 mg Oral QHS Emily Massar T, MD   300 mg at 05/31/22 2133   diphenhydrAMINE (BENADRYL) capsule 50 mg  50 mg Oral Q6H PRN Davaughn Hillyard T, MD   50 mg at 05/12/22 1158   Or   diphenhydrAMINE (BENADRYL) injection 50 mg  50 mg Intramuscular Q6H PRN Ajayla Iglesias T, MD       gabapentin (NEURONTIN) capsule 300 mg  300 mg Oral TID Tija Biss T, MD   300 mg at 06/02/22 0740   haloperidol (HALDOL) tablet 5 mg  5 mg Oral TID Clements Toro, [name], MD   5 mg at 06/02/22 0740   haloperidol (HALDOL) tablet 5 mg  5 mg Oral Q6H PRN Jory Welke T, MD   5 mg at 05/03/22 1813   Or   haloperidol lactate (HALDOL) injection 5 mg  5 mg Intramuscular Q6H PRN  Gimena Buick T, MD       magnesium hydroxide (MILK OF MAGNESIA) suspension 30 mL  30 mL Oral Daily PRN Naithan Delage T, MD   30 mL at 05/02/22 2993   metoprolol succinate (TOPROL-XL) 24 hr tablet 25 mg  25 mg Oral Daily Aarion Kittrell T, MD   25 mg at 06/02/22 0740   paliperidone (INVEGA SUSTENNA) injection 156 mg  156 mg Intramuscular Q28 days Nasim Garofano T, MD   156 mg at 05/24/22 1123   temazepam (RESTORIL) capsule 15 mg  15 mg Oral QHS Kalaya Infantino T, MD   15 mg at 05/31/22 2133   ziprasidone (GEODON) injection 20 mg  20 mg Intramuscular Q12H PRN Tori Cupps, [name], MD   20 mg at 05/03/22 0103    Lab Results: No results found for this or any previous visit (from the past 48 hour(s)).  Blood Alcohol level:  Lab Results  Component Value Date   ETH <10 07/20/2021    Metabolic Disorder Labs: Lab Results  Component Value Date   HGBA1C 5.5 04/10/2022   MPG 111.15 04/10/2022   No results found for: "PROLACTIN" Lab Results  Component Value Date   CHOL 167 11/20/2021   TRIG 107 11/20/2021   HDL 26 (L) 11/20/2021   CHOLHDL 6.4 11/20/2021   VLDL 21 11/20/2021   LDLCALC 120 (H) 11/20/2021    Physical Findings: AIMS: Facial and Oral Movements Muscles of Facial Expression: None, normal Lips and Perioral Area: None, normal Jaw: None, normal Tongue: None, normal,Extremity Movements Upper (arms, wrists, hands, fingers): None, normal Lower (legs, knees, ankles, toes): None, normal, Trunk Movements Neck, shoulders, hips: None, normal, Overall Severity Severity of abnormal movements (highest score from questions above): None, normal Incapacitation due to abnormal movements: None, normal Patient's awareness of abnormal movements (rate only patient's report): No Awareness, Dental Status Current problems with teeth and/or dentures?: No Does patient usually wear dentures?: No  CIWA:    COWS:     Musculoskeletal: Strength & Muscle Tone: within normal limits Gait & Station:  normal Patient leans: N/A  Psychiatric Specialty Exam:  Presentation  General Appearance: Appropriate for Environment; Casual; Neat; Well Groomed  Eye Contact:Fair  Speech:Slow  Speech Volume:Decreased  Handedness:Left   Mood and Affect  Mood:Anxious  Affect:Congruent   Thought Process  Thought Processes:Goal Directed  Descriptions of Associations:Circumstantial  Orientation:Partial  Thought Content:Scattered  History of Schizophrenia/Schizoaffective disorder:Yes  Duration of Psychotic Symptoms:Greater than six months  Hallucinations:No data recorded Ideas of Reference:None  Suicidal  Thoughts:No data recorded Homicidal Thoughts:No data recorded  Sensorium  Memory:Remote Good  Judgment:Fair  Insight:Fair   Executive Functions  Concentration:Fair  Attention Span:Fair  [place]   Psychomotor Activity  Psychomotor Activity:No data recorded  Assets  Assets:Housing; Resilience; Social Support; Talents/Skills   Sleep  Sleep:No data recorded   Physical Exam: Physical Exam Vitals and nursing note reviewed.  Constitutional:      Appearance: Normal appearance.  HENT:     Head: Normocephalic and atraumatic.     Mouth/Throat:     Pharynx: Oropharynx is clear.  Eyes:     Pupils: Pupils are equal, round, and reactive to light.  Cardiovascular:     Rate and Rhythm: Normal rate and regular rhythm.  Pulmonary:     Effort: Pulmonary effort is normal.     Breath sounds: Normal breath sounds.  Abdominal:     General: Abdomen is flat.     Palpations: Abdomen is soft.  Musculoskeletal:        General: Normal range of motion.  Skin:    General: Skin is warm and dry.  Neurological:     General: No focal deficit present.     Mental Status: He is alert. Mental status is at baseline.  Psychiatric:        Attention and Perception: Attention normal.        Mood and Affect: Mood normal. Affect is blunt.         Speech: He is noncommunicative.    Review of Systems  Unable to perform ROS: Language  Neurological: Negative.    Blood pressure 122/82, pulse 96, temperature 98.9 F (37.2 C), temperature source Oral, resp. rate 18, height 5\' 8"  (1.727 m), weight 85.8 kg, SpO2 100 %. Body mass index is 28.76 kg/m.   Treatment Plan Summary: Medication management and Plan no change to medication management.  Doing well on the Clozapine at this point.  Minimal side effects.  No complaints of constipation.  Awake and alert during the day.  Behavior is good.  We are still just stuck waiting for discharge planning  Alethia Berthold, MD 06/02/2022, 10:43 AM

## 2022-06-02 NOTE — Plan of Care (Signed)
Met with patient in medication room. Pt is aphasic, with breif mentions of a football team. Pt denies SI / HI / AVH. Pt is seen multiple times with cellphone that he is permitted by administrative staff in milieu. Pt has to be redirected multiple times. Multiple items of contraband are noted in pt room. These items have been permitted by administrative staff. Pt is adherent with scheduled medications. Staff will continue to monitor for safety.   Problem: Education: Goal: Ability to state activities that reduce stress will improve Outcome: Not Progressing   Problem: Coping: Goal: Ability to identify and develop effective coping behavior will improve Outcome: Not Progressing   Problem: Self-Concept: Goal: Ability to identify factors that promote anxiety will improve Outcome: Not Progressing Goal: Level of anxiety will decrease Outcome: Not Progressing

## 2022-06-03 NOTE — Plan of Care (Signed)
  Problem: Education: Goal: Ability to state activities that reduce stress will improve Outcome: Progressing   Problem: Self-Concept: Goal: Ability to identify factors that promote anxiety will improve Outcome: Progressing Goal: Level of anxiety will decrease Outcome: Progressing   

## 2022-06-03 NOTE — Progress Notes (Signed)
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 No pertinent surgical history. Family History: History reviewed. No pertinent family history. Family Psychiatric  History: None Social History:  Social History   Substance and Sexual Activity  Alcohol Use Not Currently     Social History   Substance and Sexual Activity  Drug Use Not Currently    Social History   Socioeconomic History   Marital status: Single    Spouse name: Not on file   Number of children: Not on file   Years of education: Not on file   Highest education level: Not on file  Occupational History   Not on file  Tobacco Use   Smoking status: Never    Passive exposure: Never   Smokeless tobacco: Never  Vaping Use   Vaping Use: Unknown  Substance and Sexual Activity   Alcohol use: Not Currently   Drug use: Not Currently   Sexual activity: Not Currently  Other Topics Concern   Not on file  Social History Narrative   Not on file   Social Determinants of Health   Financial Resource Strain: Not on file  Food Insecurity: Not on file  Transportation Needs: Not on file  Physical Activity: Not on file  Stress: Not on file  Social Connections: Not on file   Additional Social History:  Specify valuables  returned:  (none)                      Sleep: Fair  Appetite:  Fair  Current Medications: Current Facility-Administered Medications  Medication Dose Route Frequency Provider Last Rate Last Admin   acetaminophen (TYLENOL) tablet 650 mg  650 mg Oral Q6H PRN Deegan Valentino T, MD   650 mg at 05/31/22 1828   alum & mag hydroxide-simeth (MAALOX/MYLANTA) 200-200-20 MG/5ML suspension 30 mL  30 mL Oral Q4H PRN Angell Honse T, MD   30 mL at 04/26/22 [id number]   aspirin EC tablet 81 mg  81 mg Oral Daily Wanita Derenzo, [name], MD   81 mg at 06/03/22 0844   atorvastatin (LIPITOR) tablet 20 mg  20 mg Oral Daily Fontaine Kossman T, MD   20 mg at 06/03/22 0844   atropine 1 % ophthalmic solution 1 drop  1 drop Sublingual QID [name], MD   1 drop at 06/03/22 0846   benztropine (COGENTIN) tablet 0.5 mg  0.5 mg Oral BID Kahiau Schewe T, MD   0.5 mg at 06/03/22 0845   clonazePAM (KLONOPIN) tablet 1 mg  1 mg Oral TID PRN He, [name], MD   1 mg at 05/25/22 1835   cloZAPine (CLOZARIL) tablet 300 mg  300 mg Oral QHS Lillyanne Bradburn T, MD   300 mg at 06/02/22 2103   diphenhydrAMINE (BENADRYL) capsule 50 mg  50 mg Oral Q6H PRN Wylan Gentzler, [name],  MD   50 mg at 05/12/22 1158   Or   diphenhydrAMINE (BENADRYL) injection 50 mg  50 mg Intramuscular Q6H PRN Shaia Porath T, MD       gabapentin (NEURONTIN) capsule 300 mg  300 mg Oral TID Vaudine Dutan, [name], MD   300 mg at 06/03/22 0844   haloperidol (HALDOL) tablet 5 mg  5 mg Oral TID Trevis Eden, [name], MD   5 mg at 06/03/22 0845   haloperidol (HALDOL) tablet 5 mg  5 mg Oral Q6H PRN Fabiano Ginley T, MD   5 mg at 05/03/22 1813   Or   haloperidol lactate (HALDOL) injection 5 mg  5 mg Intramuscular Q6H PRN Mikeisha Lemonds T, MD       magnesium hydroxide (MILK OF MAGNESIA) suspension 30 mL  30 mL Oral Daily PRN Latayvia Mandujano T, MD   30 mL at 05/02/22 7824   metoprolol succinate (TOPROL-XL) 24 hr tablet 25 mg  25 mg Oral Daily Mckayla Mulcahey T, MD   25 mg at 06/03/22 0845   paliperidone  (INVEGA SUSTENNA) injection 156 mg  156 mg Intramuscular Q28 days Oreatha Fabry T, MD   156 mg at 05/24/22 1123   temazepam (RESTORIL) capsule 15 mg  15 mg Oral QHS Scot Shiraishi T, MD   15 mg at 06/02/22 2103   ziprasidone (GEODON) injection 20 mg  20 mg Intramuscular Q12H PRN Tychelle Purkey, [name], MD   20 mg at 05/03/22 0103    Lab Results: No results found for this or any previous visit (from the past 48 hour(s)).  Blood Alcohol level:  Lab Results  Component Value Date   ETH <10 07/20/2021    Metabolic Disorder Labs: Lab Results  Component Value Date   HGBA1C 5.5 04/10/2022   MPG 111.15 04/10/2022   No results found for: "PROLACTIN" Lab Results  Component Value Date   CHOL 167 11/20/2021   TRIG 107 11/20/2021   HDL 26 (L) 11/20/2021   CHOLHDL 6.4 11/20/2021   VLDL 21 11/20/2021   LDLCALC 120 (H) 11/20/2021    Physical Findings: AIMS: Facial and Oral Movements Muscles of Facial Expression: None, normal Lips and Perioral Area: None, normal Jaw: None, normal Tongue: None, normal,Extremity Movements Upper (arms, wrists, hands, fingers): None, normal Lower (legs, knees, ankles, toes): None, normal, Trunk Movements Neck, shoulders, hips: None, normal, Overall Severity Severity of abnormal movements (highest score from questions above): None, normal Incapacitation due to abnormal movements: None, normal Patient's awareness of abnormal movements (rate only patient's report): No Awareness, Dental Status Current problems with teeth and/or dentures?: No Does patient usually wear dentures?: No  CIWA:    COWS:     Musculoskeletal: Strength & Muscle Tone: within normal limits Gait & Station: normal Patient leans: N/A  Psychiatric Specialty Exam:  Presentation  General Appearance: Appropriate for Environment; Casual; Neat; Well Groomed  Eye Contact:Fair  Speech:Slow  Speech Volume:Decreased  Handedness:Left   Mood and Affect   Mood:Anxious  Affect:Congruent   Thought Process  Thought Processes:Goal Directed  Descriptions of Associations:Circumstantial  Orientation:Partial  Thought Content:Scattered  History of Schizophrenia/Schizoaffective disorder:Yes  Duration of Psychotic Symptoms:Greater than six months  Hallucinations:No data recorded Ideas of Reference:None  Suicidal Thoughts:No data recorded Homicidal Thoughts:No data recorded  Sensorium  Memory:Remote Good  Judgment:Fair  Insight:Fair   Executive Functions  Concentration:Fair  Attention Span:Fair  Recall:Fair  Fund of Knowledge:Fair  Language:Fair   Psychomotor Activity  Psychomotor Activity:No data recorded  Assets  Assets:Housing; Resilience; Social Support;  Talents/Skills   Sleep  Sleep:No data recorded   Physical Exam: Physical Exam Vitals and nursing note reviewed.  Constitutional:      Appearance: Normal appearance.  HENT:     Head: Normocephalic and atraumatic.     Mouth/Throat:     Pharynx: Oropharynx is clear.  Eyes:     Pupils: Pupils are equal, round, and reactive to light.  Cardiovascular:     Rate and Rhythm: Normal rate and regular rhythm.  Pulmonary:     Effort: Pulmonary effort is normal.     Breath sounds: Normal breath sounds.  Abdominal:     General: Abdomen is flat.     Palpations: Abdomen is soft.  Musculoskeletal:        General: Normal range of motion.  Skin:    General: Skin is warm and dry.  Neurological:     General: No focal deficit present.     Mental Status: He is alert. Mental status is at baseline.  Psychiatric:        Mood and Affect: Mood normal.        Thought Content: Thought content normal.    Review of Systems  Unable to perform ROS: Language   Blood pressure (!) 151/94, pulse 90, temperature 97.6 F (36.4 C), temperature source Oral, resp. rate 18, height 5\' 8"  (1.727 m), weight 85.8 kg, SpO2 99 %. Body mass index is 28.76 kg/m.   Treatment Plan  Summary: Plan completely stable patient.  Tolerating Clozapine.  No physical complaints.  No behavior problems.  Entirely waiting on placement.  , MD 06/03/2022, 11:35 AM

## 2022-06-03 NOTE — Progress Notes (Signed)
D: Patient alert and oriented. Patient denies pain. Patient denies anxiety and depression. Patient denies SI/HI/AVH. Patient frequently observed walking around the unit. Patient picked haldol out of 1700 medications stating "no way." Patient later came up to nurses station requesting the medication.   A: Scheduled medications administered to patient, per MD orders. Support and encouragement provided to patient.  Q15 minute safety checks maintained.   R: Patient compliant with medication administration and treatment plan. No adverse drug reactions noted. Patient remains safe on the unit at this time.

## 2022-06-03 NOTE — Progress Notes (Signed)
Patient alert and oriented x 4 no distress noted he is interacting appropriately with peers and staff, no distress noted, he was complaint with medication this shift. 15 minutes safety checks maintained will closely monitor. 

## 2022-06-04 NOTE — Progress Notes (Signed)
[facility] MD Progress Note  06/04/2022 7:34 AM Adam [name]  MRN:  [id number]  Subjective: Client denies issues.  According to the notes, his sleep was good along with appetite.  No outbursts.  Calmly watching a video in his room, awaiting placement.  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 No pertinent surgical history. Family History: History reviewed. No pertinent family history. Family Psychiatric  History: None Social History:  Social History   Substance and Sexual Activity  Alcohol Use Not Currently     Social History   Substance and Sexual Activity  Drug Use Not Currently    Social History   Socioeconomic History   Marital status: Single    Spouse name: Not on file   Number of children: Not on file   Years of education: Not on file   Highest education level: Not on file  Occupational History   Not on file  Tobacco Use   Smoking status: Never    Passive exposure: Never   Smokeless tobacco: Never  Vaping Use   Vaping Use: Unknown  Substance and Sexual Activity   Alcohol use: Not Currently   Drug use: Not Currently   Sexual activity: Not Currently  Other Topics Concern   Not on file  Social History Narrative   Not on file   Social Determinants of Health   Financial Resource Strain: Not on file  Food Insecurity: Not on file  Transportation Needs: Not on  file  Physical Activity: Not on file  Stress: Not on file  Social Connections: Not on file   Additional Social History:   Sleep: Fair  Appetite:  Fair  Current Medications: Current Facility-Administered Medications  Medication Dose Route Frequency Provider Last Rate Last Admin   acetaminophen (TYLENOL) tablet 650 mg  650 mg Oral Q6H PRN [name], [name], MD   650 mg at 05/31/22 1828   alum & mag hydroxide-simeth (MAALOX/MYLANTA) 200-200-20 MG/5ML suspension 30 mL  30 mL Oral Q4H PRN [name], [name], MD   30 mL at 04/26/22 2951   aspirin EC tablet 81 mg  81 mg Oral Daily [name], [name], MD   81 mg at 06/03/22 0844   atorvastatin (LIPITOR) tablet 20 mg  20 mg Oral Daily [name], [name], MD   20 mg at 06/03/22 0844   atropine 1 % ophthalmic solution 1 drop  1 drop Sublingual QID [name], MD   1 drop at 06/03/22 2144   benztropine (COGENTIN) tablet 0.5 mg  0.5 mg Oral BID [name], [name], MD   0.5 mg at 06/03/22 1659   clonazePAM (KLONOPIN) tablet 1 mg  1 mg Oral TID PRN He, [name], MD   1 mg at 05/25/22 1835   cloZAPine (CLOZARIL) tablet 300 mg  300 mg Oral QHS [name], [name], MD   300 mg at 06/03/22 2147   diphenhydrAMINE (BENADRYL) capsule 50 mg  50 mg Oral Q6H PRN  [name], [name], MD   50 mg at 05/12/22 1158   Or   diphenhydrAMINE (BENADRYL) injection 50 mg  50 mg Intramuscular Q6H PRN [name], [name], MD       gabapentin (NEURONTIN) capsule 300 mg  300 mg Oral TID [name], [name], MD   300 mg at 06/03/22 1659   haloperidol (HALDOL) tablet 5 mg  5 mg Oral TID [name], [name], MD   5 mg at 06/03/22 1722   haloperidol (HALDOL) tablet 5 mg  5 mg Oral Q6H PRN [name], [name], MD   5 mg at 05/03/22 1813   Or   haloperidol lactate (HALDOL) injection 5 mg  5 mg Intramuscular Q6H PRN [name], [name], MD       magnesium hydroxide (MILK OF MAGNESIA) suspension 30 mL  30 mL Oral Daily PRN [name], [name], MD   30 mL at 05/02/22 1749   metoprolol succinate (TOPROL-XL) 24 hr tablet 25 mg  25 mg  Oral Daily [name], [name], MD   25 mg at 06/03/22 0845   paliperidone (INVEGA SUSTENNA) injection 156 mg  156 mg Intramuscular Q28 days [name], [name], MD   156 mg at 05/24/22 1123   temazepam (RESTORIL) capsule 15 mg  15 mg Oral QHS [name], [name], MD   15 mg at 06/03/22 2147   ziprasidone (GEODON) injection 20 mg  20 mg Intramuscular Q12H PRN [name], [name], MD   20 mg at 05/03/22 0103    Lab Results: No results found for this or any previous visit (from the past 48 hour(s)).  Blood Alcohol level:  Lab Results  Component Value Date   ETH <10 07/20/2021    Metabolic Disorder Labs: Lab Results  Component Value Date   HGBA1C 5.5 04/10/2022   MPG 111.15 04/10/2022   No results found for: "PROLACTIN" Lab Results  Component Value Date   CHOL 167 11/20/2021   TRIG 107 11/20/2021   HDL 26 (L) 11/20/2021   CHOLHDL 6.4 11/20/2021   VLDL 21 11/20/2021   LDLCALC 120 (H) 11/20/2021    Physical Findings: AIMS: Facial and Oral Movements Muscles of Facial Expression: None, normal Lips and Perioral Area: None, normal Jaw: None, normal Tongue: None, normal,Extremity Movements Upper (arms, wrists, hands, fingers): None, normal Lower (legs, knees, ankles, toes): None, normal, Trunk Movements Neck, shoulders, hips: None, normal, Overall Severity Severity of abnormal movements (highest score from questions above): None, normal Incapacitation due to abnormal movements: None, normal Patient's awareness of abnormal movements (rate only patient's report): No Awareness, Dental Status Current problems with teeth and/or dentures?: No Does patient usually wear dentures?: No  CIWA:    COWS:     Musculoskeletal: Strength & Muscle Tone: within normal limits Gait & Station: normal Patient leans: N/A  Psychiatric Specialty Exam: Physical Exam Vitals and nursing note reviewed.  Constitutional:      Appearance: Normal appearance.  HENT:     Head: Normocephalic.     Nose: Nose normal.   Pulmonary:     Effort: Pulmonary effort is normal.  Musculoskeletal:        General: Normal range of motion.     Cervical back: Normal range of motion.  Skin:    General: Skin is dry.  Neurological:     General: No focal deficit present.     Mental Status: He is alert and oriented to person, place, and time. Mental status is at baseline.  Psychiatric:  Attention and Perception: Attention and perception normal.        Mood and Affect: Mood is elated.        Speech: Speech is slurred.        Behavior: Behavior normal. Behavior is cooperative.        Thought Content: Thought content normal.        Cognition and Memory: Cognition is impaired.        Judgment: Judgment normal.     Review of Systems  Unable to perform ROS: Language  All other systems reviewed and are negative.   Blood pressure (!) 151/94, pulse 90, temperature 97.6 F (36.4 C), temperature source Oral, resp. rate 18, height 5\' 8"  (1.727 m), weight 85.8 kg, SpO2 99 %.Body mass index is 28.76 kg/m.  General Appearance: Casual  Eye Contact:  Good  Speech:  Slurred  Volume:  Normal  Mood:  Euthymic  Affect:  Blunt  Thought Process:  Linear  Orientation:  Full (Time, Place, and Person)  Thought Content:  Logical  Suicidal Thoughts:  No  Homicidal Thoughts:  No  Memory:  UTA  Judgement:  Fair  Insight:  UTA  Psychomotor Activity:  Normal  Concentration:  Concentration: Good and Attention Span: Good  Recall:  UTA  Fund of Knowledge:  Fair  Language:  Poor  Akathisia:  No  Handed:  Right  AIMS (if indicated):     Assets:  Leisure Time Physical Health Resilience Social Support  ADL's:  Intact  Cognition:  Impaired,  Moderate  Sleep:         Physical Exam: Physical Exam Vitals and nursing note reviewed.  Constitutional:      Appearance: Normal appearance.  HENT:     Head: Normocephalic.     Nose: Nose normal.  Pulmonary:     Effort: Pulmonary effort is normal.  Musculoskeletal:         General: Normal range of motion.     Cervical back: Normal range of motion.  Skin:    General: Skin is dry.  Neurological:     General: No focal deficit present.     Mental Status: He is alert and oriented to person, place, and time. Mental status is at baseline.  Psychiatric:        Attention and Perception: Attention and perception normal.        Mood and Affect: Mood is elated.        Speech: Speech is slurred.        Behavior: Behavior normal. Behavior is cooperative.        Thought Content: Thought content normal.        Cognition and Memory: Cognition is impaired.        Judgment: Judgment normal.    Review of Systems  Unable to perform ROS: Language  All other systems reviewed and are negative.  Blood pressure (!) 151/94, pulse 90, temperature 97.6 F (36.4 C), temperature source Oral, resp. rate 18, height 5\' 8"  (1.727 m), weight 85.8 kg, SpO2 99 %. Body mass index is 28.76 kg/m.   Treatment Plan Summary: Plan completely stable patient.  Tolerating Clozapine.  No physical complaints.  No behavior problems.  Entirely waiting on placement. Difficulty controlling behavior as late effect of traumatic brain injury (HCC) Clozaril 300 mg daily at bedtime Haldol 5 mg TID Invega 156 mg monthly  Anxiety: Klonopin 1 mg TID PRN Gabapentin 300 mg TID  EPS: Cogentin 0.5 mg BID  Insomnia: Restoril 15 mg daily  at bedtime  Nanine Means, NP 06/04/2022, 7:34 AM

## 2022-06-04 NOTE — Progress Notes (Signed)
Recreation Therapy Notes  Date: 06/04/2022  Time: 9:50 am     Location: Courtyard   Behavioral response: Appropriate  Intervention Topic:  Strengths    Discussion/Intervention:  Group content today was focused on strengths. The group identified some of the strengths they have. Individuals stated reason why they do not use their strengths. Patients expressed what strengths others see in them. The group identified important reason to use their strengths. The group participated in the intervention "Picking strengths", where they had a chance to identify some of their strengths. Clinical Observations/Feedback: Patient came to group late and walked around repeating his name. Participant eventually returned to his room. Draycen Leichter LRT/CTRS         Masiel Gentzler 06/04/2022 11:40 AM

## 2022-06-04 NOTE — Plan of Care (Signed)
  Problem: Education: Goal: Ability to state activities that reduce stress will improve Outcome: Progressing   Problem: Coping: Goal: Ability to identify and develop effective coping behavior will improve Outcome: Progressing   Problem: Self-Concept: Goal: Level of anxiety will decrease Outcome: Progressing Goal: Ability to modify response to factors that promote anxiety will improve Outcome: Progressing   Problem: Education: Goal: Knowledge of General Education information will improve Description: Including pain rating scale, medication(s)/side effects and non-pharmacologic comfort measures Outcome: Progressing   Problem: Health Behavior/Discharge Planning: Goal: Ability to manage health-related needs will improve Outcome: Progressing   Problem: Clinical Measurements: Goal: Will remain free from infection Outcome: Progressing

## 2022-06-04 NOTE — Progress Notes (Signed)
D: Patient alert and oriented. Patient denies pain. Patient denies anxiety and depression. Patient denies SI/HI/AVH. Patient frequently observed walking around the unit. Patient will pick haldol out of medication to take later then come back to the nurses station requesting to take haldol.  A: Scheduled medications administered to patient, per MD orders.  Support and encouragement provided to patient.  Q15 minute safety checks maintained.   R: Patient compliant with medication administration and treatment plan. No adverse drug reactions noted. Patient remains safe on the unit at this time.

## 2022-06-05 LAB — CBC WITH DIFFERENTIAL/PLATELET
Abs Immature Granulocytes: 0.01 10*3/uL (ref 0.00–0.07)
Basophils Absolute: 0 10*3/uL (ref 0.0–0.1)
Basophils Relative: 1 %
Eosinophils Absolute: 0 10*3/uL (ref 0.0–0.5)
Eosinophils Relative: 0 %
HCT: 41.1 % (ref 39.0–52.0)
Hemoglobin: 14.1 g/dL (ref 13.0–17.0)
Immature Granulocytes: 0 %
Lymphocytes Relative: 35 %
Lymphs Abs: 1.4 10*3/uL (ref 0.7–4.0)
MCH: 28.9 pg (ref 26.0–34.0)
MCHC: 34.3 g/dL (ref 30.0–36.0)
MCV: 84.2 fL (ref 80.0–100.0)
Monocytes Absolute: 0.4 10*3/uL (ref 0.1–1.0)
Monocytes Relative: 10 %
Neutro Abs: 2.2 10*3/uL (ref 1.7–7.7)
Neutrophils Relative %: 54 %
Platelets: 151 10*3/uL (ref 150–400)
RBC: 4.88 MIL/uL (ref 4.22–5.81)
RDW: 12.9 % (ref 11.5–15.5)
WBC: 4 10*3/uL (ref 4.0–10.5)
nRBC: 0 % (ref 0.0–0.2)

## 2022-06-05 NOTE — Group Note (Signed)
LCSW Group Therapy Note   Group Date: 06/05/2022 Start Time: 1300 End Time: 1400   Type of Therapy and Topic:  Group Therapy: Boundaries  Participation Level:  Did Not Attend  Description of Group: This group will address the use of boundaries in their personal lives. Patients will explore why boundaries are important, the difference between healthy and unhealthy boundaries, and negative and postive outcomes of different boundaries and will look at how boundaries can be crossed.  Patients will be encouraged to identify current boundaries in their own lives and identify what kind of boundary is being set. Facilitators will guide patients in utilizing problem-solving interventions to address and correct types boundaries being used and to address when no boundary is being used. Understanding and applying boundaries will be explored and addressed for obtaining and maintaining a balanced life. Patients will be encouraged to explore ways to assertively make their boundaries and needs known to significant others in their lives, using other group members and facilitator for role play, support, and feedback.  Therapeutic Goals:  1.  Patient will identify areas in their life where setting clear boundaries could be  used to improve their life.  2.  Patient will identify signs/triggers that a boundary is not being respected. 3.  Patient will identify two ways to set boundaries in order to achieve balance in  their lives: 4.  Patient will demonstrate ability to communicate their needs and set boundaries  through discussion and/or role plays  Summary of Patient Progress:   Patient did not attend   Therapeutic Modalities:   Cognitive Behavioral Therapy Solution-Focused Therapy  Almedia Balls 06/05/2022  2:57 PM

## 2022-06-05 NOTE — Progress Notes (Addendum)
[facility] MD Progress Note  06/05/2022 9:19 AM Adam [name]  MRN:  [id number]  Subjective: Client is pleasant, calm, and cooperative.  He was eating breakfast earlier and focused on this task.  Later, he was ambulating on the unit with a bright smile, answers questions with no or yes, no distress noted.   Denies side effects from his medications,  continue to wait for placement and guardian approval.  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 No pertinent surgical history. Family History: History reviewed. No pertinent family history. Family Psychiatric  History: None Social History:  Social History   Substance and Sexual Activity  Alcohol Use Not Currently     Social History   Substance and Sexual Activity  Drug Use Not Currently    Social History   Socioeconomic History   Marital status: Single    Spouse name: Not on file   Number of children: Not on file   Years of education: Not on file   Highest education level: Not on file  Occupational History   Not on file  Tobacco Use   Smoking status: Never    Passive exposure: Never   Smokeless tobacco: Never  Vaping Use   Vaping Use: Unknown  Substance and Sexual Activity   Alcohol use: Not Currently   Drug use: Not Currently   Sexual activity: Not Currently  Other Topics Concern   Not on file  Social History Narrative    Not on file   Social Determinants of Health   Financial Resource Strain: Not on file  Food Insecurity: Not on file  Transportation Needs: Not on file  Physical Activity: Not on file  Stress: Not on file  Social Connections: Not on file   Additional Social History:   Sleep: Fair  Appetite:  Fair  Current Medications: Current Facility-Administered Medications  Medication Dose Route Frequency Provider Last Rate Last Admin   acetaminophen (TYLENOL) tablet 650 mg  650 mg Oral Q6H PRN [name], [name], MD   650 mg at 05/31/22 1828   alum & mag hydroxide-simeth (MAALOX/MYLANTA) 200-200-20 MG/5ML suspension 30 mL  30 mL Oral Q4H PRN [name], [name], MD   30 mL at 04/26/22 5284   aspirin EC tablet 81 mg  81 mg Oral Daily [name], [name], MD   81 mg at 06/05/22 0818   atorvastatin (LIPITOR) tablet 20 mg  20 mg Oral Daily [name], [name], MD   20 mg at 06/05/22 0818   atropine 1 % ophthalmic solution 1 drop  1 drop Sublingual QID [name], MD   1 drop at 06/05/22 0818   benztropine (COGENTIN) tablet 0.5 mg  0.5 mg Oral BID [name], [name], MD   0.5 mg at 06/05/22 0818   clonazePAM (KLONOPIN) tablet 1 mg  1 mg Oral TID PRN He, [name], MD   1 mg at 05/25/22 1835   cloZAPine (CLOZARIL) tablet 300 mg  300  mg Oral QHS [name], [name], MD   300 mg at 06/04/22 2124   diphenhydrAMINE (BENADRYL) capsule 50 mg  50 mg Oral Q6H PRN [name], [name], MD   50 mg at 05/12/22 1158   Or   diphenhydrAMINE (BENADRYL) injection 50 mg  50 mg Intramuscular Q6H PRN [name], [name], MD       gabapentin (NEURONTIN) capsule 300 mg  300 mg Oral TID [name], [name], MD   300 mg at 06/05/22 0818   haloperidol (HALDOL) tablet 5 mg  5 mg Oral TID [name], [name], MD   5 mg at 06/05/22 0818   haloperidol (HALDOL) tablet 5 mg  5 mg Oral Q6H PRN [name], [name], MD   5 mg at 05/03/22 1813   Or   haloperidol lactate (HALDOL) injection 5 mg  5 mg Intramuscular Q6H PRN [name], [name], MD       magnesium hydroxide (MILK OF  MAGNESIA) suspension 30 mL  30 mL Oral Daily PRN [name], [name], MD   30 mL at 05/02/22 1219   metoprolol succinate (TOPROL-XL) 24 hr tablet 25 mg  25 mg Oral Daily [name], [name], MD   25 mg at 06/05/22 0818   paliperidone (INVEGA SUSTENNA) injection 156 mg  156 mg Intramuscular Q28 days [name], [name], MD   156 mg at 05/24/22 1123   temazepam (RESTORIL) capsule 15 mg  15 mg Oral QHS [name], [name], MD   15 mg at 06/04/22 2123   ziprasidone (GEODON) injection 20 mg  20 mg Intramuscular Q12H PRN [name], [name], MD   20 mg at 05/03/22 0103    Lab Results: No results found for this or any previous visit (from the past 48 hour(s)).  Blood Alcohol level:  Lab Results  Component Value Date   ETH <10 07/20/2021    Metabolic Disorder Labs: Lab Results  Component Value Date   HGBA1C 5.5 04/10/2022   MPG 111.15 04/10/2022   No results found for: "PROLACTIN" Lab Results  Component Value Date   CHOL 167 11/20/2021   TRIG 107 11/20/2021   HDL 26 (L) 11/20/2021   CHOLHDL 6.4 11/20/2021   VLDL 21 11/20/2021   LDLCALC 120 (H) 11/20/2021    Physical Findings: AIMS: Facial and Oral Movements Muscles of Facial Expression: None, normal Lips and Perioral Area: None, normal Jaw: None, normal Tongue: None, normal,Extremity Movements Upper (arms, wrists, hands, fingers): None, normal Lower (legs, knees, ankles, toes): None, normal, Trunk Movements Neck, shoulders, hips: None, normal, Overall Severity Severity of abnormal movements (highest score from questions above): None, normal Incapacitation due to abnormal movements: None, normal Patient's awareness of abnormal movements (rate only patient's report): No Awareness, Dental Status Current problems with teeth and/or dentures?: No Does patient usually wear dentures?: No  CIWA:    COWS:     Musculoskeletal: Strength & Muscle Tone: within normal limits Gait & Station: normal Patient leans: N/A  Psychiatric Specialty Exam: Physical  Exam Vitals and nursing note reviewed.  Constitutional:      Appearance: Normal appearance.  HENT:     Head: Normocephalic.     Nose: Nose normal.  Pulmonary:     Effort: Pulmonary effort is normal.  Musculoskeletal:        General: Normal range of motion.     Cervical back: Normal range of motion.  Skin:    General: Skin is dry.  Neurological:     General: No focal deficit present.  Mental Status: He is alert and oriented to person, place, and time. Mental status is at baseline.  Psychiatric:        Attention and Perception: Attention and perception normal.        Mood and Affect: Mood is elated.        Speech: Speech is slurred.        Behavior: Behavior normal. Behavior is cooperative.        Thought Content: Thought content normal.        Cognition and Memory: Cognition is impaired.        Judgment: Judgment normal.     Review of Systems  Unable to perform ROS: Language  All other systems reviewed and are negative.   Blood pressure (!) 146/100, pulse 85, temperature 97.9 F (36.6 C), temperature source Oral, resp. rate 18, height 5\' 8"  (1.727 m), weight 85.8 kg, SpO2 99 %.Body mass index is 28.76 kg/m.  General Appearance: Casual  Eye Contact:  Good  Speech:  Slurred  Volume:  Normal  Mood:  Euthymic  Affect:  Blunt  Thought Process:  Linear  Orientation:  Full (Time, Place, and Person)  Thought Content:  Logical  Suicidal Thoughts:  No  Homicidal Thoughts:  No  Memory:  UTA  Judgement:  Fair  Insight:  UTA  Psychomotor Activity:  Normal  Concentration:  Concentration: Good and Attention Span: Good  Recall:  UTA  Fund of Knowledge:  Fair  Language:  Poor  Akathisia:  No  Handed:  Right  AIMS (if indicated):     Assets:  Leisure Time Physical Health Resilience Social Support  ADL's:  Intact  Cognition:  Impaired,  Moderate  Sleep:         Physical Exam: Physical Exam Vitals and nursing note reviewed.  Constitutional:      Appearance: Normal  appearance.  HENT:     Head: Normocephalic.     Nose: Nose normal.  Pulmonary:     Effort: Pulmonary effort is normal.  Musculoskeletal:        General: Normal range of motion.     Cervical back: Normal range of motion.  Skin:    General: Skin is dry.  Neurological:     General: No focal deficit present.     Mental Status: He is alert and oriented to person, place, and time. Mental status is at baseline.  Psychiatric:        Attention and Perception: Attention and perception normal.        Mood and Affect: Mood is elated.        Speech: Speech is slurred.        Behavior: Behavior normal. Behavior is cooperative.        Thought Content: Thought content normal.        Cognition and Memory: Cognition is impaired.        Judgment: Judgment normal.    Review of Systems  Unable to perform ROS: Language  All other systems reviewed and are negative.  Blood pressure (!) 146/100, pulse 85, temperature 97.9 F (36.6 C), temperature source Oral, resp. rate 18, height 5\' 8"  (1.727 m), weight 85.8 kg, SpO2 99 %. Body mass index is 28.76 kg/m.   Treatment Plan Summary: Plan completely stable patient.  Tolerating Clozapine.  No physical complaints.  No behavior problems.  Entirely waiting on placement. Difficulty controlling behavior as late effect of traumatic brain injury ([place]) Clozaril 300 mg daily at bedtime Haldol 5 mg TID Lorayne Bender  156 mg monthly  Anxiety: Klonopin 1 mg TID PRN Gabapentin 300 mg TID  EPS: Cogentin 0.5 mg BID  Insomnia: Restoril 15 mg daily at bedtime  Nanine Means, NP 06/05/2022, 9:19 AM

## 2022-06-05 NOTE — Plan of Care (Signed)
D: Pt alert and oriented. Pt rates depression 0/10, hopelessness 0/10, and anxiety 0/10. Pt reports energy level as low. Pt reports sleep last night as being good. Pt did receive medications for sleep. Pt denies experiencing any pain at this time. Pt denies experiencing any SI/HI, or AVH at this time.   After dinner pt was groaning and hollering from his room. This writer went to check on the pt and ask what was wrong. Pt repeated home multiple times. This Clinical research associate asked are you homesick? Are you wanting to go home. Pt replied yeah, yeah, home, go home. Pt appeared depressed and sad at this time. This Clinical research associate asked do you want me to stay and we can talk about it. Pt replied NO! This Clinical research associate asked would you like me to give you some space and go? Pt replied yeah, yeah, then went back to being loud again.   A: Scheduled medications administered to pt, per MD orders. Support and encouragement provided. Frequent verbal contact made. Routine safety checks conducted q15 minutes.   R: No adverse drug reactions noted. Pt verbally contracts for safety at this time. Pt compliant with medications and treatment plan. Pt interacts well with others on the unit. Pt remains safe at this time. Will continue to monitor.   Problem: Self-Concept: Goal: Level of anxiety will decrease Outcome: Progressing Goal: Ability to modify response to factors that promote anxiety will improve Outcome: Progressing

## 2022-06-05 NOTE — Progress Notes (Signed)
Pharmacy Consult - Clozapine     61 yo male ordered clozapine 300 mg PO qHS    Clozapine REMS enrollment Verified: yes Clozapine REMS enrollment: 01/04/22  REMS patient ID: [id number]     Dose Adjustments This Admission:   Decrease dose from 375mg  to 300mg  on  4/10     Dose Adjustments This Admission: 2/3 started on clozapine 25mg   2/10 clozapine 100 mg  2/20 clozapine 175 mg 2/22 clozapine 200 mg  3/06 clozapine 250 mg 3/08 clozapine 300 mg 3/17 clozapine 350 mg 4/10 clozapine 300 mg   Plan: clozapine 300mg  qHS as previously ordered by Dr 5/06 7/4 ANC= 2200 (reported to REMS 06/05/22) ANC monitoring once weekly Next ANC 06/12/22    **CLOZAPINE MONITORING** (reflects NEW REMS GUIDELINES EFFECTIVE 09/13/2014): Check ANC at least weekly while inpatient.   For general population patients, i.e., those without benign ethnic neutropenia (BEN): --If ANC 1000-1499, increase ANC monitoring to 3x/wk --If ANC < 1000, HOLD CLOZAPINE and get psych consult  Toni Amend PharmD Clinical Pharmacist 06/05/2022

## 2022-06-06 NOTE — TOC Progression Note (Signed)
Transition of Care [facility] - Progression Note    Patient Details  Name: Adam [name] MRN: [id number] Date of Birth: February 03, 1961  Transition of Care [facility] CM/SW Contact  Margarito Liner, LCSW Phone Number: 06/06/2022, 1:57 PM  Clinical Narrative:   Per [facility] director, [facility] is full. He asked CSW to find out level of assistance with ADL's, including cuing. Sent secure chat to RN.  Expected Discharge Plan and Services                                                 Social Determinants of Health (SDOH) Interventions    Readmission Risk Interventions     No data to display

## 2022-06-06 NOTE — Progress Notes (Signed)
Recreation Therapy Notes   Date: 06/06/2022  Time: 10:15 am     Location: Craft room     Behavioral response: N/A   Intervention Topic: Time Management    Discussion/Intervention: Patient refused to attend group.   Clinical Observations/Feedback:  Patient refused to attend group.    Zoraida Havrilla LRT/CTRS        Rajiv Parlato 06/06/2022 12:51 PM

## 2022-06-06 NOTE — [facility] Group Notes (Signed)
[facility] Group Notes:  (Nursing/MHT/Case Management/Adjunct)  Date:  06/06/2022  Time:  9:11 AM  Type of Therapy:   Community Meeting  Participation Level:  Did Not Attend   Lynelle Smoke [facility] 06/06/2022, 9:11 AM

## 2022-06-06 NOTE — Progress Notes (Signed)
[facility] MD Progress Note  06/06/2022 3:23 PM Adam [name]  MRN:  [id number]  Subjective: Client is pleasant, calm, and cooperative.  He inquires about his Invega injection as he does not know when he had it last.  Pharmacy called to inquire and his last dose was on 6/22, already scheduled.  Denies side effects from his medications,  continue to wait for placement and guardian approval.  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 No pertinent surgical history. Family History: History reviewed. No pertinent family history. Family Psychiatric  History: None Social History:  Social History   Substance and Sexual Activity  Alcohol Use Not Currently     Social History   Substance and Sexual Activity  Drug Use Not Currently    Social History   Socioeconomic History   Marital status: Single    Spouse name: Not on file   Number of children: Not on file   Years of education: Not on file   Highest education level: Not on file  Occupational History   Not on file  Tobacco Use   Smoking status: Never    Passive exposure: Never   Smokeless tobacco: Never  Vaping Use   Vaping Use: Unknown  Substance and Sexual Activity   Alcohol use: Not Currently   Drug use: Not Currently   Sexual activity: Not Currently  Other Topics Concern   Not on file  Social History Narrative   Not on  file   Social Determinants of Health   Financial Resource Strain: Not on file  Food Insecurity: Not on file  Transportation Needs: Not on file  Physical Activity: Not on file  Stress: Not on file  Social Connections: Not on file   Additional Social History:   Sleep: Fair  Appetite:  Fair  Current Medications: Current Facility-Administered Medications  Medication Dose Route Frequency Provider Last Rate Last Admin   acetaminophen (TYLENOL) tablet 650 mg  650 mg Oral Q6H PRN [name], [name], MD   650 mg at 05/31/22 1828   alum & mag hydroxide-simeth (MAALOX/MYLANTA) 200-200-20 MG/5ML suspension 30 mL  30 mL Oral Q4H PRN [name], [name], MD   30 mL at 04/26/22 8588   aspirin EC tablet 81 mg  81 mg Oral Daily [name], [name], MD   81 mg at 06/06/22 5027   atorvastatin (LIPITOR) tablet 20 mg  20 mg Oral Daily [name], [name], MD   20 mg at 06/06/22 7412   atropine 1 % ophthalmic solution 1 drop  1 drop Sublingual QID [name], MD   1 drop at 06/06/22 1214   benztropine (COGENTIN) tablet 0.5 mg  0.5 mg Oral BID [name], [name], MD   0.5 mg at 06/06/22 8786   clonazePAM (KLONOPIN) tablet 1 mg  1 mg Oral TID PRN He, [name], MD   1 mg at 05/25/22 1835   cloZAPine (CLOZARIL) tablet 300 mg  300 mg Oral  QHS [name], [name], MD   300 mg at 06/05/22 2346   diphenhydrAMINE (BENADRYL) capsule 50 mg  50 mg Oral Q6H PRN [name], [name], MD   50 mg at 05/12/22 1158   Or   diphenhydrAMINE (BENADRYL) injection 50 mg  50 mg Intramuscular Q6H PRN [name], [name], MD       gabapentin (NEURONTIN) capsule 300 mg  300 mg Oral TID [name], [name], MD   300 mg at 06/06/22 1213   haloperidol (HALDOL) tablet 5 mg  5 mg Oral TID [name], [name], MD   5 mg at 06/06/22 1213   haloperidol (HALDOL) tablet 5 mg  5 mg Oral Q6H PRN [name], [name], MD   5 mg at 05/03/22 1813   Or   haloperidol lactate (HALDOL) injection 5 mg  5 mg Intramuscular Q6H PRN [name], [name], MD       magnesium hydroxide (MILK OF MAGNESIA)  suspension 30 mL  30 mL Oral Daily PRN [name], [name], MD   30 mL at 05/02/22 6734   metoprolol succinate (TOPROL-XL) 24 hr tablet 25 mg  25 mg Oral Daily [name], [name], MD   25 mg at 06/06/22 0821   paliperidone (INVEGA SUSTENNA) injection 156 mg  156 mg Intramuscular Q28 days [name], [name], MD   156 mg at 05/24/22 1123   temazepam (RESTORIL) capsule 15 mg  15 mg Oral QHS [name], [name], MD   15 mg at 06/05/22 2345   ziprasidone (GEODON) injection 20 mg  20 mg Intramuscular Q12H PRN [name], [name], MD   20 mg at 05/03/22 0103    Lab Results:  Results for orders placed or performed during the hospital encounter of 03/19/22 (from the past 48 hour(s))  CBC with Differential/Platelet     Status: None   Collection Time: 06/05/22 11:04 AM  Result Value Ref Range   WBC 4.0 4.0 - 10.5 K/uL   RBC 4.88 4.22 - 5.81 MIL/uL   Hemoglobin 14.1 13.0 - 17.0 g/dL   HCT 19.3 79.0 - 24.0 %   MCV 84.2 80.0 - 100.0 fL   MCH 28.9 26.0 - 34.0 pg   MCHC 34.3 30.0 - 36.0 g/dL   RDW 97.3 53.2 - 99.2 %   Platelets 151 150 - 400 K/uL   nRBC 0.0 0.0 - 0.2 %   Neutrophils Relative % 54 %   Neutro Abs 2.2 1.7 - 7.7 K/uL   Lymphocytes Relative 35 %   Lymphs Abs 1.4 0.7 - 4.0 K/uL   Monocytes Relative 10 %   Monocytes Absolute 0.4 0.1 - 1.0 K/uL   Eosinophils Relative 0 %   Eosinophils Absolute 0.0 0.0 - 0.5 K/uL   Basophils Relative 1 %   Basophils Absolute 0.0 0.0 - 0.1 K/uL   Immature Granulocytes 0 %   Abs Immature Granulocytes 0.01 0.00 - 0.07 K/uL    Comment: Performed at [facility], [address] [place], [place] [address]    Blood Alcohol level:  Lab Results  Component Value Date   [facility] <10 07/20/2021    Metabolic Disorder Labs: Lab Results  Component Value Date   HGBA1C 5.5 04/10/2022   MPG 111.15 04/10/2022   No results found for: "PROLACTIN" Lab Results  Component Value Date   CHOL 167 11/20/2021   TRIG 107 11/20/2021   HDL 26 (L) 11/20/2021   CHOLHDL 6.4  11/20/2021   VLDL 21 11/20/2021   LDLCALC 120 (H) 11/20/2021  Physical Findings: AIMS: Facial and Oral Movements Muscles of Facial Expression: None, normal Lips and Perioral Area: None, normal Jaw: None, normal Tongue: None, normal,Extremity Movements Upper (arms, wrists, hands, fingers): None, normal Lower (legs, knees, ankles, toes): None, normal, Trunk Movements Neck, shoulders, hips: None, normal, Overall Severity Severity of abnormal movements (highest score from questions above): None, normal Incapacitation due to abnormal movements: None, normal Patient's awareness of abnormal movements (rate only patient's report): No Awareness, Dental Status Current problems with teeth and/or dentures?: No Does patient usually wear dentures?: No  CIWA:    COWS:     Musculoskeletal: Strength & Muscle Tone: within normal limits Gait & Station: normal Patient leans: N/A  Psychiatric Specialty Exam: Physical Exam Vitals and nursing note reviewed.  Constitutional:      Appearance: Normal appearance.  HENT:     Head: Normocephalic.     Nose: Nose normal.  Pulmonary:     Effort: Pulmonary effort is normal.  Musculoskeletal:        General: Normal range of motion.     Cervical back: Normal range of motion.  Skin:    General: Skin is dry.  Neurological:     General: No focal deficit present.     Mental Status: He is alert and oriented to person, place, and time. Mental status is at baseline.  Psychiatric:        Attention and Perception: Attention and perception normal.        Mood and Affect: Mood is elated.        Speech: Speech is slurred.        Behavior: Behavior normal. Behavior is cooperative.        Thought Content: Thought content normal.        Cognition and Memory: Cognition is impaired.        Judgment: Judgment normal.     Review of Systems  Unable to perform ROS: Language  All other systems reviewed and are negative.   Blood pressure (!) 117/100, pulse 95,  temperature 98.6 F (37 C), temperature source Oral, resp. rate 19, height 5\' 8"  (1.727 m), weight 85.8 kg, SpO2 100 %.Body mass index is 28.76 kg/m.  General Appearance: Casual  Eye Contact:  Good  Speech:  Slurred  Volume:  Normal  Mood:  Euthymic  Affect:  Blunt  Thought Process:  Linear  Orientation:  Full (Time, Place, and Person)  Thought Content:  Logical  Suicidal Thoughts:  No  Homicidal Thoughts:  No  Memory:  UTA  Judgement:  Fair  Insight:  UTA  Psychomotor Activity:  Normal  Concentration:  Concentration: Good and Attention Span: Good  Recall:  UTA  Fund of Knowledge:  Fair  Language:  Poor  Akathisia:  No  Handed:  Right  AIMS (if indicated):     Assets:  Leisure Time Physical Health Resilience Social Support  ADL's:  Intact  Cognition:  Impaired,  Moderate  Sleep:         Physical Exam: Physical Exam Vitals and nursing note reviewed.  Constitutional:      Appearance: Normal appearance.  HENT:     Head: Normocephalic.     Nose: Nose normal.  Pulmonary:     Effort: Pulmonary effort is normal.  Musculoskeletal:        General: Normal range of motion.     Cervical back: Normal range of motion.  Skin:    General: Skin is dry.  Neurological:     General: No focal  deficit present.     Mental Status: He is alert and oriented to person, place, and time. Mental status is at baseline.  Psychiatric:        Attention and Perception: Attention and perception normal.        Mood and Affect: Mood is elated.        Speech: Speech is slurred.        Behavior: Behavior normal. Behavior is cooperative.        Thought Content: Thought content normal.        Cognition and Memory: Cognition is impaired.        Judgment: Judgment normal.    Review of Systems  Unable to perform ROS: Language  All other systems reviewed and are negative.  Blood pressure (!) 117/100, pulse 95, temperature 98.6 F (37 C), temperature source Oral, resp. rate 19, height 5\' 8"   (1.727 m), weight 85.8 kg, SpO2 100 %. Body mass index is 28.76 kg/m.   Treatment Plan Summary: Plan completely stable patient.  Tolerating Clozapine.  No physical complaints.  No behavior problems.  Entirely waiting on placement. Difficulty controlling behavior as late effect of traumatic brain injury (HCC) Clozaril 300 mg daily at bedtime Haldol 5 mg TID Invega 156 mg monthly  Anxiety: Klonopin 1 mg TID PRN Gabapentin 300 mg TID  EPS: Cogentin 0.5 mg BID  Insomnia: Restoril 15 mg daily at bedtime  , NP 06/06/2022, 3:23 PM

## 2022-06-06 NOTE — Progress Notes (Signed)
Patient was active on the unit before going to sleep after snack.  He had been in the dayroom watching tv with minimal interaction with his peers.  He endorsed depression when asked about symptoms. He is med compliant and received his meds once he awoke.  Will continue to monitor with q 15 minute safety checks.       C Butler-Nicholson, LPN

## 2022-06-06 NOTE — Plan of Care (Signed)
  Problem: Education: Goal: Ability to state activities that reduce stress will improve Outcome: Progressing   Problem: Coping: Goal: Ability to identify and develop effective coping behavior will improve Outcome: Progressing   Problem: Self-Concept: Goal: Ability to identify factors that promote anxiety will improve Outcome: Progressing Goal: Level of anxiety will decrease Outcome: Progressing Goal: Ability to modify response to factors that promote anxiety will improve Outcome: Progressing   Problem: Education: Goal: Knowledge of General Education information will improve Description: Including pain rating scale, medication(s)/side effects and non-pharmacologic comfort measures Outcome: Progressing   Problem: Skin Integrity: Goal: Risk for impaired skin integrity will decrease Outcome: Progressing   Problem: Safety: Goal: Ability to remain free from injury will improve Outcome: Progressing   Problem: Pain Managment: Goal: General experience of comfort will improve Outcome: Progressing   Problem: Elimination: Goal: Will not experience complications related to bowel motility Outcome: Progressing Goal: Will not experience complications related to urinary retention Outcome: Progressing   

## 2022-06-06 NOTE — NC FL2 (Addendum)
[place] MEDICAID FL2 LEVEL OF CARE SCREENING TOOL     IDENTIFICATION  Patient Name: Adam [name] Birthdate: Aug 18, 1961 Sex: male Admission Date (Current Location): 03/19/2022  [facility] and [place] Number:  Chiropodist and Address:  [facility], [address], [place], [place] [address]      Provider Number: [id number]  Attending Physician Name and Address:  [name], [name], MD  Relative Name and Phone Number:       Current Level of Care: Hospital Recommended Level of Care: Skilled Nursing Facility Prior Approval Number:    Date Approved/Denied:   PASRR Number:    Discharge Plan: SNF    Current Diagnoses: Patient Active Problem List   Diagnosis Date Noted   Difficulty controlling behavior as late effect of traumatic brain injury (HCC) 03/19/2022   TBI (traumatic brain injury) (HCC) 11/27/2021   NSTEMI (non-ST elevated myocardial infarction) (HCC) 11/21/2021   DNR (do not resuscitate)/DNI(Do Not Intubate) 11/20/2021   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) 07/25/2021   Schizophrenia, chronic condition with acute exacerbation (HCC) 07/20/2021    Orientation RESPIRATION BLADDER Height & Weight      (Fluctuates)  Normal Continent Weight: 189 lb 2.5 oz (85.8 kg) Height:  5\' 8"  (172.7 cm)  BEHAVIORAL SYMPTOMS/MOOD NEUROLOGICAL BOWEL NUTRITION STATUS   (Cooperative)  (TBI) Continent Diet (Regular)  AMBULATORY STATUS COMMUNICATION OF NEEDS Skin   Independent Verbally Skin abrasions                       Personal Care Assistance Level of Assistance  Bathing, Feeding, Dressing Bathing Assistance: Limited assistance Feeding assistance: Limited assistance Dressing Assistance: Limited assistance     Functional Limitations Info  Sight, Hearing, Speech Sight Info: Adequate Hearing Info: Adequate Speech Info: Adequate    SPECIAL CARE FACTORS FREQUENCY                       Contractures  Contractures Info: Not present    Additional Factors Info  Code Status, Allergies, Psychotropic Code Status Info: Full code Allergies Info: Morphine and related, Nsaids Psychotropic Info: Schizophrenia         Current Medications (06/06/2022):  This is the current hospital active medication list Current Facility-Administered Medications  Medication Dose Route Frequency Provider Last Rate Last Admin   acetaminophen (TYLENOL) tablet 650 mg  650 mg Oral Q6H PRN [name], 08/07/2022, MD   650 mg at 05/31/22 1828   alum & mag hydroxide-simeth (MAALOX/MYLANTA) 200-200-20 MG/5ML suspension 30 mL  30 mL Oral Q4H PRN [name], [name], MD   30 mL at 04/26/22 04/28/22   aspirin EC tablet 81 mg  81 mg Oral Daily [name], [id number], MD   81 mg at 06/06/22 08/07/22   atorvastatin (LIPITOR) tablet 20 mg  20 mg Oral Daily [name], [name], MD   20 mg at 06/06/22 08/07/22   atropine 1 % ophthalmic solution 1 drop  1 drop Sublingual QID 3875, MD   1 drop at 06/06/22 1607   benztropine (COGENTIN) tablet 0.5 mg  0.5 mg Oral BID [name], [name], MD   0.5 mg at 06/06/22 1607   clonazePAM (KLONOPIN) tablet 1 mg  1 mg Oral TID PRN He, [name], MD   1 mg at 05/25/22 1835   cloZAPine (CLOZARIL) tablet 300 mg  300 mg Oral QHS [name], [name], MD   300 mg at 06/05/22 2346   diphenhydrAMINE (BENADRYL)  capsule 50 mg  50 mg Oral Q6H PRN [name], [name], MD   50 mg at 05/12/22 1158   Or   diphenhydrAMINE (BENADRYL) injection 50 mg  50 mg Intramuscular Q6H PRN [name], [name], MD       gabapentin (NEURONTIN) capsule 300 mg  300 mg Oral TID [name], [name], MD   300 mg at 06/06/22 1607   haloperidol (HALDOL) tablet 5 mg  5 mg Oral TID [name], [name], MD   5 mg at 06/06/22 1607   haloperidol (HALDOL) tablet 5 mg  5 mg Oral Q6H PRN [name], [name], MD   5 mg at 05/03/22 1813   Or   haloperidol lactate (HALDOL) injection 5 mg  5 mg Intramuscular Q6H PRN [name], [name], MD       magnesium hydroxide (MILK OF MAGNESIA) suspension 30 mL  30 mL  Oral Daily PRN [name], [name], MD   30 mL at 05/02/22 4196   metoprolol succinate (TOPROL-XL) 24 hr tablet 25 mg  25 mg Oral Daily [name], [name], MD   25 mg at 06/06/22 2229   paliperidone (INVEGA SUSTENNA) injection 156 mg  156 mg Intramuscular Q28 days [name], [name], MD   156 mg at 05/24/22 1123   temazepam (RESTORIL) capsule 15 mg  15 mg Oral QHS [name], [name], MD   15 mg at 06/05/22 2345   ziprasidone (GEODON) injection 20 mg  20 mg Intramuscular Q12H PRN [name], [name], MD   20 mg at 05/03/22 0103     Discharge Medications: Please see discharge summary for a list of discharge medications.  Relevant Imaging Results:  Relevant Lab Results:   Additional Information SS#: [id number]  Margarito Liner, LCSW

## 2022-06-06 NOTE — Plan of Care (Signed)
D: Pt alert and oriented. Pt rates depression 0/10, hopelessness 0/10, and anxiety 0/10 . Pt reports energy level as normal and concentration as being good. Pt reports sleep last night as being good. Pt did receive medications for sleep and did find them helpful. Pt denies experiencing any pain at this time. Pt denies experiencing any SI/HI, or AVH at this time.   Pt persistently asking about his Invega injection, MD notified. This Clinical research associate was notified that it was given 6/22. This Clinical research associate notified the pt. The pt then became irritable, yelling, shouting, and pacing. Pt was offered prn medications in which he took PO willingly.   A: Scheduled medications administered to pt, per MD orders. Support and encouragement provided. Frequent verbal contact made. Routine safety checks conducted q15 minutes.   R: No adverse drug reactions noted. Pt verbally contracts for safety at this time. Pt compliant with medications and treatment plan. Pt interacts well with others on the unit at times. Pt remains safe at this time. Will continue to monitor.   Problem: Coping: Goal: Ability to identify and develop effective coping behavior will improve Outcome: Progressing   Problem: Self-Concept: Goal: Level of anxiety will decrease Outcome: Progressing

## 2022-06-07 NOTE — Progress Notes (Signed)
Recreation Therapy Notes   Date: 06/07/2022  Time: 10:20 am    Location: Courtyard       Behavioral response: N/A   Intervention Topic: Wellness   Discussion/Intervention: Patient refused to attend group.   Clinical Observations/Feedback:  Patient refused to attend group.    Rien Marland LRT/CTRS        Hashir Deleeuw 06/07/2022 11:18 AM

## 2022-06-07 NOTE — Progress Notes (Signed)
Recreation Therapy Notes   Date: 06/07/2022  Time: 10:20 am    Location: Courtyard       Behavioral response: N/A   Intervention Topic: Wellness   Discussion/Intervention: Patient refused to attend group.   Clinical Observations/Feedback:  Patient refused to attend group.    Shafter Jupin LRT/CTRS        Fusae Florio 06/07/2022 11:19 AM

## 2022-06-07 NOTE — Plan of Care (Signed)
  Problem: Education: Goal: Ability to state activities that reduce stress will improve Outcome: Progressing   Problem: Coping: Goal: Ability to identify and develop effective coping behavior will improve Outcome: Progressing   Problem: Self-Concept: Goal: Ability to identify factors that promote anxiety will improve Outcome: Progressing Goal: Level of anxiety will decrease Outcome: Progressing Goal: Ability to modify response to factors that promote anxiety will improve Outcome: Progressing   Problem: Education: Goal: Knowledge of General Education information will improve Description: Including pain rating scale, medication(s)/side effects and non-pharmacologic comfort measures Outcome: Progressing   Problem: Skin Integrity: Goal: Risk for impaired skin integrity will decrease Outcome: Progressing   Problem: Safety: Goal: Ability to remain free from injury will improve Outcome: Progressing   Problem: Pain Managment: Goal: General experience of comfort will improve Outcome: Progressing   Problem: Elimination: Goal: Will not experience complications related to bowel motility Outcome: Progressing Goal: Will not experience complications related to urinary retention Outcome: Progressing   Problem: Coping: Goal: Level of anxiety will decrease Outcome: Progressing   Problem: Nutrition: Goal: Adequate nutrition will be maintained Outcome: Progressing

## 2022-06-07 NOTE — Progress Notes (Signed)
[facility] MD Progress Note  06/07/2022 10:44 AM Adam [name]  MRN:  [id number]  Subjective: Client is pleasant, calm, and cooperative.  He was agitated yesterday afternoon and needed a PRN Geodon.  Today, he interacts pleasantly as he watching television in the day room on his hall.  Denies side effects from his medications,  continue to wait for placement and guardian approval.  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 No pertinent surgical history. Family History: History reviewed. No pertinent family history. Family Psychiatric  History: None Social History:  Social History   Substance and Sexual Activity  Alcohol Use Not Currently     Social History   Substance and Sexual Activity  Drug Use Not Currently    Social History   Socioeconomic History   Marital status: Single    Spouse name: Not on file   Number of children: Not on file   Years of education: Not on file   Highest education level: Not on file  Occupational History   Not on file  Tobacco Use   Smoking status: Never    Passive exposure: Never   Smokeless tobacco: Never  Vaping Use   Vaping Use: Unknown  Substance and Sexual Activity   Alcohol use: Not Currently   Drug use: Not Currently   Sexual activity: Not Currently  Other Topics Concern   Not on file  Social History Narrative   Not on file    Social Determinants of Health   Financial Resource Strain: Not on file  Food Insecurity: Not on file  Transportation Needs: Not on file  Physical Activity: Not on file  Stress: Not on file  Social Connections: Not on file   Additional Social History:   Sleep: Fair  Appetite:  Fair  Current Medications: Current Facility-Administered Medications  Medication Dose Route Frequency Provider Last Rate Last Admin   acetaminophen (TYLENOL) tablet 650 mg  650 mg Oral Q6H PRN [name], [name], MD   650 mg at 05/31/22 1828   alum & mag hydroxide-simeth (MAALOX/MYLANTA) 200-200-20 MG/5ML suspension 30 mL  30 mL Oral Q4H PRN [name], [name], MD   30 mL at 04/26/22 3646   aspirin EC tablet 81 mg  81 mg Oral Daily [name], [name], MD   81 mg at 06/07/22 8032   atorvastatin (LIPITOR) tablet 20 mg  20 mg Oral Daily [name], [name], MD   20 mg at 06/07/22 0908   atropine 1 % ophthalmic solution 1 drop  1 drop Sublingual QID [name], MD   1 drop at 06/07/22 0908   benztropine (COGENTIN) tablet 0.5 mg  0.5 mg Oral BID [name], [name], MD   0.5 mg at 06/07/22 1224   clonazePAM (KLONOPIN) tablet 1 mg  1 mg Oral TID PRN He, [name], MD   1 mg at 06/06/22 1615   cloZAPine (CLOZARIL) tablet 300 mg  300 mg Oral QHS [name], [name],  MD   300 mg at 06/06/22 2116   diphenhydrAMINE (BENADRYL) capsule 50 mg  50 mg Oral Q6H PRN [name], [name], MD   50 mg at 06/06/22 1613   Or   diphenhydrAMINE (BENADRYL) injection 50 mg  50 mg Intramuscular Q6H PRN [name], [name], MD       gabapentin (NEURONTIN) capsule 300 mg  300 mg Oral TID [name], [name], MD   300 mg at 06/07/22 0908   haloperidol (HALDOL) tablet 5 mg  5 mg Oral TID [name], [name], MD   5 mg at 06/07/22 3875   haloperidol (HALDOL) tablet 5 mg  5 mg Oral Q6H PRN [name], [name], MD   5 mg at 05/03/22 1813   Or   haloperidol lactate (HALDOL) injection 5 mg  5 mg Intramuscular Q6H PRN [name], [name], MD       magnesium hydroxide (MILK OF MAGNESIA)  suspension 30 mL  30 mL Oral Daily PRN [name], [name], MD   30 mL at 05/02/22 6433   metoprolol succinate (TOPROL-XL) 24 hr tablet 25 mg  25 mg Oral Daily [name], [name], MD   25 mg at 06/07/22 0913   paliperidone (INVEGA SUSTENNA) injection 156 mg  156 mg Intramuscular Q28 days [name], [name], MD   156 mg at 05/24/22 1123   temazepam (RESTORIL) capsule 15 mg  15 mg Oral QHS [name], [name], MD   15 mg at 06/06/22 2116   ziprasidone (GEODON) injection 20 mg  20 mg Intramuscular Q12H PRN [name], [name], MD   20 mg at 06/06/22 1735    Lab Results:  Results for orders placed or performed during the hospital encounter of 03/19/22 (from the past 48 hour(s))  CBC with Differential/Platelet     Status: None   Collection Time: 06/05/22 11:04 AM  Result Value Ref Range   WBC 4.0 4.0 - 10.5 K/uL   RBC 4.88 4.22 - 5.81 MIL/uL   Hemoglobin 14.1 13.0 - 17.0 g/dL   HCT 29.5 18.8 - 41.6 %   MCV 84.2 80.0 - 100.0 fL   MCH 28.9 26.0 - 34.0 pg   MCHC 34.3 30.0 - 36.0 g/dL   RDW 60.6 30.1 - 60.1 %   Platelets 151 150 - 400 K/uL   nRBC 0.0 0.0 - 0.2 %   Neutrophils Relative % 54 %   Neutro Abs 2.2 1.7 - 7.7 K/uL   Lymphocytes Relative 35 %   Lymphs Abs 1.4 0.7 - 4.0 K/uL   Monocytes Relative 10 %   Monocytes Absolute 0.4 0.1 - 1.0 K/uL   Eosinophils Relative 0 %   Eosinophils Absolute 0.0 0.0 - 0.5 K/uL   Basophils Relative 1 %   Basophils Absolute 0.0 0.0 - 0.1 K/uL   Immature Granulocytes 0 %   Abs Immature Granulocytes 0.01 0.00 - 0.07 K/uL    Comment: Performed at [facility], [address] [place], [place] [address]    Blood Alcohol level:  Lab Results  Component Value Date   [facility] <10 07/20/2021    Metabolic Disorder Labs: Lab Results  Component Value Date   HGBA1C 5.5 04/10/2022   MPG 111.15 04/10/2022   No results found for: "PROLACTIN" Lab Results  Component Value Date   CHOL 167 11/20/2021   TRIG 107 11/20/2021   HDL 26 (L) 11/20/2021   CHOLHDL 6.4  11/20/2021   VLDL 21 11/20/2021   LDLCALC 120 (H) 11/20/2021    Physical  Findings: AIMS: Facial and Oral Movements Muscles of Facial Expression: None, normal Lips and Perioral Area: None, normal Jaw: None, normal Tongue: None, normal,Extremity Movements Upper (arms, wrists, hands, fingers): None, normal Lower (legs, knees, ankles, toes): None, normal, Trunk Movements Neck, shoulders, hips: None, normal, Overall Severity Severity of abnormal movements (highest score from questions above): None, normal Incapacitation due to abnormal movements: None, normal Patient's awareness of abnormal movements (rate only patient's report): No Awareness, Dental Status Current problems with teeth and/or dentures?: No Does patient usually wear dentures?: No  CIWA:    COWS:     Musculoskeletal: Strength & Muscle Tone: within normal limits Gait & Station: normal Patient leans: N/A  Psychiatric Specialty Exam: Physical Exam Vitals and nursing note reviewed.  Constitutional:      Appearance: Normal appearance.  HENT:     Head: Normocephalic.     Nose: Nose normal.  Pulmonary:     Effort: Pulmonary effort is normal.  Musculoskeletal:        General: Normal range of motion.     Cervical back: Normal range of motion.  Skin:    General: Skin is dry.  Neurological:     General: No focal deficit present.     Mental Status: He is alert and oriented to person, place, and time. Mental status is at baseline.  Psychiatric:        Attention and Perception: Attention and perception normal.        Mood and Affect: Mood is elated.        Speech: Speech is slurred.        Behavior: Behavior normal. Behavior is cooperative.        Thought Content: Thought content normal.        Cognition and Memory: Cognition is impaired.        Judgment: Judgment normal.     Review of Systems  Unable to perform ROS: Language  All other systems reviewed and are negative.   Blood pressure 125/81, pulse 82,  temperature 97.8 F (36.6 C), temperature source Oral, resp. rate 18, height 5\' 8"  (1.727 m), weight 85.8 kg, SpO2 100 %.Body mass index is 28.76 kg/m.  General Appearance: Casual  Eye Contact:  Good  Speech:  Slurred  Volume:  Normal  Mood:  Euthymic  Affect:  Blunt  Thought Process:  Linear  Orientation:  Full (Time, Place, and Person)  Thought Content:  Logical  Suicidal Thoughts:  No  Homicidal Thoughts:  No  Memory:  UTA  Judgement:  Fair  Insight:  UTA  Psychomotor Activity:  Normal  Concentration:  Concentration: Good and Attention Span: Good  Recall:  UTA  Fund of Knowledge:  Fair  Language:  Poor  Akathisia:  No  Handed:  Right  AIMS (if indicated):     Assets:  Leisure Time Physical Health Resilience Social Support  ADL's:  Intact  Cognition:  Impaired,  Moderate  Sleep:         Physical Exam: Physical Exam Vitals and nursing note reviewed.  Constitutional:      Appearance: Normal appearance.  HENT:     Head: Normocephalic.     Nose: Nose normal.  Pulmonary:     Effort: Pulmonary effort is normal.  Musculoskeletal:        General: Normal range of motion.     Cervical back: Normal range of motion.  Skin:    General: Skin is dry.  Neurological:     General: No focal deficit present.  Mental Status: He is alert and oriented to person, place, and time. Mental status is at baseline.  Psychiatric:        Attention and Perception: Attention and perception normal.        Mood and Affect: Mood is elated.        Speech: Speech is slurred.        Behavior: Behavior normal. Behavior is cooperative.        Thought Content: Thought content normal.        Cognition and Memory: Cognition is impaired.        Judgment: Judgment normal.    Review of Systems  Unable to perform ROS: Language  All other systems reviewed and are negative.  Blood pressure 125/81, pulse 82, temperature 97.8 F (36.6 C), temperature source Oral, resp. rate 18, height 5\' 8"   (1.727 m), weight 85.8 kg, SpO2 100 %. Body mass index is 28.76 kg/m.   Treatment Plan Summary: Plan completely stable patient.  Tolerating Clozapine.  No physical complaints.  No behavior problems.  Entirely waiting on placement. Difficulty controlling behavior as late effect of traumatic brain injury (HCC) Clozaril 300 mg daily at bedtime Haldol 5 mg TID Invega 156 mg monthly  Anxiety: Klonopin 1 mg TID PRN Gabapentin 300 mg TID  EPS: Cogentin 0.5 mg BID  Insomnia: Restoril 15 mg daily at bedtime  , NP 06/07/2022, 10:44 AM

## 2022-06-07 NOTE — Plan of Care (Signed)
D: Pt alert and oriented. Pt rates depression 0/10, hopelessness 0/10, and anxiety 0/10. Pt reports energy level as low and concentration as being good. Pt reports sleep last night as being good. Pt did receive medications for sleep and did find them helpful. Pt denies experiencing any pain at this time. Pt denies experiencing any SI/HI, or AVH at this time.   A: Scheduled medications administered to pt, per MD orders. Support and encouragement provided. Frequent verbal contact made. Routine safety checks conducted q15 minutes.   R: No adverse drug reactions noted. Pt verbally contracts for safety at this time. Pt compliant with medications. Pt interacts well with others on the unit most of the time. Pt remains safe at this time. Will continue to monitor.   Problem: Education: Goal: Ability to state activities that reduce stress will improve Outcome: Progressing   Problem: Coping: Goal: Ability to identify and develop effective coping behavior will improve Outcome: Progressing

## 2022-06-07 NOTE — Progress Notes (Signed)
Adam [name] has been pleasant since the start of the shift.  He as been active on the unit spending time in dayroom with his peers. He is med compliant and received his medication without incident. He denies si/hi/avh pain and anxiety at this encounter.  He does endorse depression and says he wants to leave. Support and encouragement offered to [place]. Q 15 minute safety rounds implemented. Will continue to monitor.      C Butler-Nicholson, LPN

## 2022-06-07 NOTE — [facility] Group Notes (Signed)
[facility] Group Notes:  (Nursing/MHT/Case Management/Adjunct)  Date:  06/07/2022  Time:  10:38 AM  Type of Therapy:   Community Meeting  Participation Level:  Did Not Attend   Lynelle Smoke [facility] 06/07/2022, 10:38 AM

## 2022-06-07 NOTE — Progress Notes (Signed)
Adam [name] is pleasant. Appears happy to see staff upon arrival.  He is active on the unit walking freely back and forth between his room and the day room.  He spends a  great deal of time in the day room hanging out watching tv with his peer.  He is med compliant and received his medication without incident.  After medications were dispersed [name] asked if he could be shaved. Staff shaved [name] and prompted him to sower and change clothes afterward. Will continue to monitor for change in status.  For now he is safe with q 15 minute safety checks.        C  Butler-Nicholson, LPN

## 2022-06-08 NOTE — Progress Notes (Signed)
Recreation Therapy Notes  Date: 06/08/2022   Time: 11:00 am      Location: Craft room      Behavioral response: N/A   Intervention Topic: Decision Making    Discussion/Intervention: Patient refused to attend group.    Clinical Observations/Feedback:  Patient refused to attend group.    Lylie Blacklock LRT/CTRS        Gladies Sofranko 06/08/2022 11:22 AM

## 2022-06-08 NOTE — Progress Notes (Signed)
[facility] MD Progress Note  06/08/2022 9:18 AM Manraj Yeo  MRN:  [id number]  Subjective: Client is pleasant, calm, and cooperative.  He has been interacting in the past 24 hours appropriately, no outbursts since 7/5, no distress or issues voiced.  Denies side effects from his medications,  continue to wait for placement and guardian approval.  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 No pertinent surgical history. Family History: History reviewed. No pertinent family history. Family Psychiatric  History: None Social History:  Social History   Substance and Sexual Activity  Alcohol Use Not Currently     Social History   Substance and Sexual Activity  Drug Use Not Currently    Social History   Socioeconomic History   Marital status: Single    Spouse name: Not on file   Number of children: Not on file   Years of education: Not on file   Highest education level: Not on file  Occupational History   Not on file  Tobacco Use   Smoking status: Never    Passive exposure: Never   Smokeless tobacco: Never  Vaping Use   Vaping Use: Unknown  Substance and Sexual Activity   Alcohol use: Not Currently   Drug use: Not Currently   Sexual activity: Not Currently  Other Topics Concern   Not on file  Social History Narrative   Not on file   Social Determinants of Health    Financial Resource Strain: Not on file  Food Insecurity: Not on file  Transportation Needs: Not on file  Physical Activity: Not on file  Stress: Not on file  Social Connections: Not on file   Additional Social History:   Sleep: Fair  Appetite:  Fair  Current Medications: Current Facility-Administered Medications  Medication Dose Route Frequency Provider Last Rate Last Admin   acetaminophen (TYLENOL) tablet 650 mg  650 mg Oral Q6H PRN [name], [name], MD   650 mg at 05/31/22 1828   alum & mag hydroxide-simeth (MAALOX/MYLANTA) 200-200-20 MG/5ML suspension 30 mL  30 mL Oral Q4H PRN [name], [name], MD   30 mL at 04/26/22 3220   aspirin EC tablet 81 mg  81 mg Oral Daily [name], [name], MD   81 mg at 06/08/22 0908   atorvastatin (LIPITOR) tablet 20 mg  20 mg Oral Daily [name], [name], MD   20 mg at 06/08/22 0908   atropine 1 % ophthalmic solution 1 drop  1 drop Sublingual QID [name], MD   1 drop at 06/08/22 0907   benztropine (COGENTIN) tablet 0.5 mg  0.5 mg Oral BID [name], [name], MD   0.5 mg at 06/08/22 0908   clonazePAM (KLONOPIN) tablet 1 mg  1 mg Oral TID PRN He, [name], MD   1 mg at 06/06/22 1615   cloZAPine (CLOZARIL) tablet 300 mg  300 mg Oral QHS [name], [name], MD   300 mg at 06/07/22  2139   diphenhydrAMINE (BENADRYL) capsule 50 mg  50 mg Oral Q6H PRN [name], [name], MD   50 mg at 06/06/22 1613   Or   diphenhydrAMINE (BENADRYL) injection 50 mg  50 mg Intramuscular Q6H PRN [name], [name], MD       gabapentin (NEURONTIN) capsule 300 mg  300 mg Oral TID [name], [name], MD   300 mg at 06/08/22 0908   haloperidol (HALDOL) tablet 5 mg  5 mg Oral TID [name], [name], MD   5 mg at 06/08/22 0908   haloperidol (HALDOL) tablet 5 mg  5 mg Oral Q6H PRN [name], [name], MD   5 mg at 05/03/22 1813   Or   haloperidol lactate (HALDOL) injection 5 mg  5 mg Intramuscular Q6H PRN [name], [name], MD       magnesium hydroxide (MILK OF MAGNESIA) suspension 30 mL  30 mL Oral Daily PRN  [name], [name], MD   30 mL at 05/02/22 5681   metoprolol succinate (TOPROL-XL) 24 hr tablet 25 mg  25 mg Oral Daily [name], [name], MD   25 mg at 06/08/22 0908   paliperidone (INVEGA SUSTENNA) injection 156 mg  156 mg Intramuscular Q28 days [name], [name], MD   156 mg at 05/24/22 1123   temazepam (RESTORIL) capsule 15 mg  15 mg Oral QHS [name], [name], MD   15 mg at 06/07/22 2139   ziprasidone (GEODON) injection 20 mg  20 mg Intramuscular Q12H PRN [name], [name], MD   20 mg at 06/06/22 1735    Lab Results: No results found for this or any previous visit (from the past 48 hour(s)).   Blood Alcohol level:  Lab Results  Component Value Date   ETH <10 07/20/2021    Metabolic Disorder Labs: Lab Results  Component Value Date   HGBA1C 5.5 04/10/2022   MPG 111.15 04/10/2022   No results found for: "PROLACTIN" Lab Results  Component Value Date   CHOL 167 11/20/2021   TRIG 107 11/20/2021   HDL 26 (L) 11/20/2021   CHOLHDL 6.4 11/20/2021   VLDL 21 11/20/2021   LDLCALC 120 (H) 11/20/2021    Physical Findings: AIMS: Facial and Oral Movements Muscles of Facial Expression: None, normal Lips and Perioral Area: None, normal Jaw: None, normal Tongue: None, normal,Extremity Movements Upper (arms, wrists, hands, fingers): None, normal Lower (legs, knees, ankles, toes): None, normal, Trunk Movements Neck, shoulders, hips: None, normal, Overall Severity Severity of abnormal movements (highest score from questions above): None, normal Incapacitation due to abnormal movements: None, normal Patient's awareness of abnormal movements (rate only patient's report): No Awareness, Dental Status Current problems with teeth and/or dentures?: No Does patient usually wear dentures?: No  CIWA:    COWS:     Musculoskeletal: Strength & Muscle Tone: within normal limits Gait & Station: normal Patient leans: N/A  Psychiatric Specialty Exam: Physical Exam Vitals and nursing note reviewed.   Constitutional:      Appearance: Normal appearance.  HENT:     Head: Normocephalic.     Nose: Nose normal.  Pulmonary:     Effort: Pulmonary effort is normal.  Musculoskeletal:        General: Normal range of motion.     Cervical back: Normal range of motion.  Skin:    General: Skin is dry.  Neurological:     General: No focal deficit present.     Mental Status: He is alert and oriented to person, place,  and time. Mental status is at baseline.  Psychiatric:        Attention and Perception: Attention and perception normal.        Mood and Affect: Mood is elated.        Speech: Speech is slurred.        Behavior: Behavior normal. Behavior is cooperative.        Thought Content: Thought content normal.        Cognition and Memory: Cognition is impaired.        Judgment: Judgment normal.     Review of Systems  Unable to perform ROS: Language  All other systems reviewed and are negative.   Blood pressure (!) 139/99, pulse 92, temperature 97.7 F (36.5 C), temperature source Oral, resp. rate 18, height 5\' 8"  (1.727 m), weight 85.8 kg, SpO2 100 %.Body mass index is 28.76 kg/m.  General Appearance: Casual  Eye Contact:  Good  Speech:  Slurred  Volume:  Normal  Mood:  Euthymic  Affect:  Blunt  Thought Process:  Linear  Orientation:  Full (Time, Place, and Person)  Thought Content:  Logical  Suicidal Thoughts:  No  Homicidal Thoughts:  No  Memory:  UTA  Judgement:  Fair  Insight:  UTA  Psychomotor Activity:  Normal  Concentration:  Concentration: Good and Attention Span: Good  Recall:  UTA  Fund of Knowledge:  Fair  Language:  Poor  Akathisia:  No  Handed:  Right  AIMS (if indicated):     Assets:  Leisure Time Physical Health Resilience Social Support  ADL's:  Intact  Cognition:  Impaired,  Moderate  Sleep:         Physical Exam: Physical Exam Vitals and nursing note reviewed.  Constitutional:      Appearance: Normal appearance.  HENT:     Head:  Normocephalic.     Nose: Nose normal.  Pulmonary:     Effort: Pulmonary effort is normal.  Musculoskeletal:        General: Normal range of motion.     Cervical back: Normal range of motion.  Skin:    General: Skin is dry.  Neurological:     General: No focal deficit present.     Mental Status: He is alert and oriented to person, place, and time. Mental status is at baseline.  Psychiatric:        Attention and Perception: Attention and perception normal.        Mood and Affect: Mood is elated.        Speech: Speech is slurred.        Behavior: Behavior normal. Behavior is cooperative.        Thought Content: Thought content normal.        Cognition and Memory: Cognition is impaired.        Judgment: Judgment normal.    Review of Systems  Unable to perform ROS: Language  All other systems reviewed and are negative.  Blood pressure (!) 139/99, pulse 92, temperature 97.7 F (36.5 C), temperature source Oral, resp. rate 18, height 5\' 8"  (1.727 m), weight 85.8 kg, SpO2 100 %. Body mass index is 28.76 kg/m.   Treatment Plan Summary: Plan completely stable patient.  Tolerating Clozapine.  No physical complaints.  No behavior problems.  Entirely waiting on placement. Difficulty controlling behavior as late effect of traumatic brain injury (HCC) Clozaril 300 mg daily at bedtime Haldol 5 mg TID Invega 156 mg monthly  Anxiety: Klonopin 1 mg TID PRN  Gabapentin 300 mg TID  EPS: Cogentin 0.5 mg BID  Insomnia: Restoril 15 mg daily at bedtime  Nanine Means, NP 06/08/2022, 9:18 AM

## 2022-06-08 NOTE — Plan of Care (Signed)
  Problem: Education: Goal: Ability to state activities that reduce stress will improve Outcome: Progressing   Problem: Self-Concept: Goal: Ability to identify factors that promote anxiety will improve Outcome: Progressing Goal: Level of anxiety will decrease Outcome: Progressing   Problem: Education: Goal: Knowledge of General Education information will improve Description: Including pain rating scale, medication(s)/side effects and non-pharmacologic comfort measures Outcome: Progressing

## 2022-06-08 NOTE — Progress Notes (Signed)
D: Patient alert and oriented. Patient denies pain. Patient denies anxiety and depression. Patient denies SI/HI/AVH. Patient frequently observed walking around the unit. Patient has been in the dayroom coloring pictures.  Patient has became irritable during lunch and dinner yelling at MHT and yelling in the dining area. Patient stops yelling after he is done eating for both lunch and dinner.   A: Scheduled medications administered to patient, per MD orders.  Support and encouragement provided to patient.  Q15 minute safety checks maintained.   R: Patient compliant with medication administration and treatment plan. No adverse drug reactions noted. Patient remains safe on the unit at this time.

## 2022-06-08 NOTE — Plan of Care (Signed)
  Problem: Education: Goal: Ability to state activities that reduce stress will improve Outcome: Progressing   Problem: Coping: Goal: Ability to identify and develop effective coping behavior will improve Outcome: Progressing   Problem: Self-Concept: Goal: Ability to identify factors that promote anxiety will improve Outcome: Progressing Goal: Level of anxiety will decrease Outcome: Progressing Goal: Ability to modify response to factors that promote anxiety will improve Outcome: Progressing   Problem: Skin Integrity: Goal: Risk for impaired skin integrity will decrease Outcome: Progressing   Problem: Pain Managment: Goal: General experience of comfort will improve Outcome: Progressing

## 2022-06-09 NOTE — Progress Notes (Signed)
Patient has been more cooperative toward the afternoon. No sign of distress.

## 2022-06-09 NOTE — Progress Notes (Signed)
[facility] MD Progress Note  06/09/2022 12:03 PM Adam [name]  MRN:  [id number] Subjective: Adam [name] is seen on rounds today.  Nurses report no issues.  Adam [name] states he has no problems.  He is tolerating the medications.  He has been compliant.  No issues. 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 No pertinent surgical history. Family History: History reviewed. No pertinent family history. Family Psychiatric  History: Unremarkable Social History:  Social History   Substance and Sexual Activity  Alcohol Use Not Currently     Social History   Substance and Sexual Activity  Drug Use Not Currently    Social History   Socioeconomic History   Marital status: Single    Spouse name: Not on file   Number of children: Not on file   Years of education: Not on file   Highest education level: Not on file  Occupational History   Not on file  Tobacco Use   Smoking status: Never    Passive exposure: Never   Smokeless tobacco: Never  Vaping Use   Vaping Use: Unknown  Substance and Sexual Activity   Alcohol use: Not Currently   Drug use: Not Currently   Sexual activity: Not Currently  Other Topics Concern   Not on file  Social History Narrative   Not on file   Social Determinants of Health   Financial Resource Strain: Not on file  Food Insecurity: Not on file  Transportation Needs: Not on file   Physical Activity: Not on file  Stress: Not on file  Social Connections: Not on file   Additional Social History:  Specify valuables returned:  (none)                      Sleep: Good  Appetite:  Good  Current Medications: Current Facility-Administered Medications  Medication Dose Route Frequency Provider Last Rate Last Admin   acetaminophen (TYLENOL) tablet 650 mg  650 mg Oral Q6H PRN [name], [name], MD   650 mg at 05/31/22 1828   alum & mag hydroxide-simeth (MAALOX/MYLANTA) 200-200-20 MG/5ML suspension 30 mL  30 mL Oral Q4H PRN [name], [name], MD   30 mL at 04/26/22 [id number]   aspirin EC tablet 81 mg  81 mg Oral Daily [name], [name], MD   81 mg at 06/08/22 0908   atorvastatin (LIPITOR) tablet 20 mg  20 mg Oral Daily [name], [name], MD   20 mg at 06/08/22 0908   atropine 1 % ophthalmic solution 1 drop  1 drop Sublingual QID [name], MD   1 drop at 06/08/22 2102   benztropine (COGENTIN) tablet 0.5 mg  0.5 mg Oral BID [name], [name], MD   0.5 mg at 06/08/22 1657   clonazePAM (KLONOPIN) tablet 1 mg  1 mg Oral TID PRN He, [name], MD   1 mg at 06/08/22 2102   cloZAPine (CLOZARIL) tablet 300 mg  300 mg Oral QHS  [name], [name], MD   300 mg at 06/08/22 2102   diphenhydrAMINE (BENADRYL) capsule 50 mg  50 mg Oral Q6H PRN [name], [name], MD   50 mg at 06/06/22 1613   Or   diphenhydrAMINE (BENADRYL) injection 50 mg  50 mg Intramuscular Q6H PRN [name], [name], MD       gabapentin (NEURONTIN) capsule 300 mg  300 mg Oral TID [name], [name], MD   300 mg at 06/08/22 1657   haloperidol (HALDOL) tablet 5 mg  5 mg Oral TID [name], [name], MD   5 mg at 06/08/22 1657   haloperidol (HALDOL) tablet 5 mg  5 mg Oral Q6H PRN [name], [name], MD   5 mg at 05/03/22 1813   Or   haloperidol lactate (HALDOL) injection 5 mg  5 mg Intramuscular Q6H PRN [name], [name], MD       magnesium hydroxide (MILK OF MAGNESIA) suspension 30 mL  30 mL Oral Daily PRN [name], [name], MD   30 mL at 05/02/22 9449    metoprolol succinate (TOPROL-XL) 24 hr tablet 25 mg  25 mg Oral Daily [name], [name], MD   25 mg at 06/08/22 0908   paliperidone (INVEGA SUSTENNA) injection 156 mg  156 mg Intramuscular Q28 days [name], [name], MD   156 mg at 05/24/22 1123   temazepam (RESTORIL) capsule 15 mg  15 mg Oral QHS [name], [name], MD   15 mg at 06/08/22 2102   ziprasidone (GEODON) injection 20 mg  20 mg Intramuscular Q12H PRN [name], [name], MD   20 mg at 06/06/22 1735    Lab Results: No results found for this or any previous visit (from the past 48 hour(s)).  Blood Alcohol level:  Lab Results  Component Value Date   ETH <10 07/20/2021    Metabolic Disorder Labs: Lab Results  Component Value Date   HGBA1C 5.5 04/10/2022   MPG 111.15 04/10/2022   No results found for: "PROLACTIN" Lab Results  Component Value Date   CHOL 167 11/20/2021   TRIG 107 11/20/2021   HDL 26 (L) 11/20/2021   CHOLHDL 6.4 11/20/2021   VLDL 21 11/20/2021   LDLCALC 120 (H) 11/20/2021    Physical Findings: AIMS: Facial and Oral Movements Muscles of Facial Expression: None, normal Lips and Perioral Area: None, normal Jaw: None, normal Tongue: None, normal,Extremity Movements Upper (arms, wrists, hands, fingers): None, normal Lower (legs, knees, ankles, toes): None, normal, Trunk Movements Neck, shoulders, hips: None, normal, Overall Severity Severity of abnormal movements (highest score from questions above): None, normal Incapacitation due to abnormal movements: None, normal Patient's awareness of abnormal movements (rate only patient's report): No Awareness, Dental Status Current problems with teeth and/or dentures?: No Does patient usually wear dentures?: No  CIWA:    COWS:     Musculoskeletal: Strength & Muscle Tone: within normal limits Gait & Station: normal Patient leans: N/A  Psychiatric Specialty Exam:  Presentation  General Appearance: Appropriate for Environment; Casual; Neat; Well Groomed  Eye  Contact:Fair  Speech:Slow  Speech Volume:Decreased  Handedness:Left   Mood and Affect  Mood:Anxious  Affect:Congruent   Thought Process  Thought Processes:Goal Directed  Descriptions of Associations:Circumstantial  Orientation:Partial  Thought Content:Scattered  History of Schizophrenia/Schizoaffective disorder:Yes  Duration of Psychotic Symptoms:Greater than six months  Hallucinations:No data recorded Ideas of Reference:None  Suicidal Thoughts:No data recorded Homicidal Thoughts:No data recorded  Sensorium  Memory:Remote Good  Judgment:Fair  Insight:Fair   Executive Functions  Concentration:Fair  Attention Span:Fair  Recall:Fair  Fund of Knowledge:Fair  Language:Fair   Psychomotor Activity  Psychomotor Activity:No data recorded  Assets  Assets:Housing; Resilience; Social Support; Talents/Skills   Sleep  Sleep:No data recorded   Physical Exam: Physical Exam Vitals and nursing note reviewed.  Constitutional:      Appearance: Normal appearance. He is normal weight.  Neurological:     General: No focal deficit present.     Mental Status: He is alert and oriented to person, place, and time.  Psychiatric:        Mood and Affect: Mood normal.        Behavior: Behavior normal.    Review of Systems  Constitutional: Negative.   HENT: Negative.    Eyes: Negative.   Respiratory: Negative.    Cardiovascular: Negative.   Gastrointestinal: Negative.   Genitourinary: Negative.   Musculoskeletal: Negative.   Skin: Negative.   Neurological: Negative.   Endo/Heme/Allergies: Negative.   Psychiatric/Behavioral: Negative.     Blood pressure (!) 136/93, pulse 87, temperature 98 F (36.7 C), temperature source Oral, resp. rate 18, height 5\' 8"  (1.727 m), weight 85.8 kg, SpO2 100 %. Body mass index is 28.76 kg/m.   Treatment Plan Summary: Daily contact with patient to assess and evaluate symptoms and progress in treatment, Medication  management, and Plan continue current medications.  , DO 06/09/2022, 12:03 PM

## 2022-06-09 NOTE — Group Note (Signed)
[facility] LCSW Group Therapy Note   Group Date: 06/09/2022 Start Time: 1300 End Time: 1400  Type of Therapy and Topic:  Group Therapy:  Feelings around Relapse and Recovery  Participation Level:  Did Not Attend   Mood:  Description of Group:    Patients in this group will discuss emotions they experience before and after a relapse. They will process how experiencing these feelings, or avoidance of experiencing them, relates to having a relapse. Facilitator will guide patients to explore emotions they have related to recovery. Patients will be encouraged to process which emotions are more powerful. They will be guided to discuss the emotional reaction significant others in their lives may have to patients' relapse or recovery. Patients will be assisted in exploring ways to respond to the emotions of others without this contributing to a relapse.  Therapeutic Goals: Patient will identify two or more emotions that lead to relapse for them:  Patient will identify two emotions that result when they relapse:  Patient will identify two emotions related to recovery:  Patient will demonstrate ability to communicate their needs through discussion and/or role plays.   Summary of Patient Progress:  Patient did not attend group despite encouraged participation.    Therapeutic Modalities:   Cognitive Behavioral Therapy Solution-Focused Therapy Assertiveness Training Relapse Prevention Therapy   Corky Crafts, [place]

## 2022-06-09 NOTE — Progress Notes (Signed)
Patient alert and oriented x 4 no distress noted he is interacting appropriately with peers and staff, no distress noted, he was complaint with medication this shift. 15 minutes safety checks maintained will closely monitor. 

## 2022-06-09 NOTE — Plan of Care (Signed)
Patient stayed in bed asleep until noon. Woke up, had lunch and medications. Irritable, restless and intrusive and frequently redirected. No physical aggressive behaviors.  Encouragements and support provided. Safety precautions maintained.

## 2022-06-10 NOTE — Progress Notes (Signed)
Patient alert and oriented x 4, affect is blunted he appears irritable, angry and intrusive. He was frequently redirected but he was not receptive and started yelling and screaming. Patient was complaint with evening medication, 15 minutes safety checks maintained will continue to monitor.

## 2022-06-10 NOTE — Progress Notes (Signed)
D: Patient alert and oriented. Patient denies pain. Patient denies anxiety and depression. Patient denies SI/HI/AVH. Patient mood irritable during the morning. Patient paces around the unit yelling at staff requesting a marker.  Patient has not been given a marker because when given a marker he has written on his bedroom walls.  Patient was in a pleasant mood for majority of the day until after dinner patient became irritable yelling "[place]" around the unit.   A: Scheduled medications administered to patient, per MD orders. Support and encouragement provided to patient.  Q15 minute safety checks maintained.   R: Patient compliant with medication administration and treatment plan. No adverse drug reactions noted. Patient remains safe on the unit at this time.

## 2022-06-10 NOTE — Plan of Care (Signed)
  Problem: Self-Concept: Goal: Ability to identify factors that promote anxiety will improve Outcome: Progressing Goal: Level of anxiety will decrease Outcome: Progressing   Problem: Education: Goal: Knowledge of General Education information will improve Description: Including pain rating scale, medication(s)/side effects and non-pharmacologic comfort measures Outcome: Progressing   Problem: Clinical Measurements: Goal: Ability to maintain clinical measurements within normal limits will improve Outcome: Progressing

## 2022-06-10 NOTE — Progress Notes (Signed)
[facility] MD Progress Note  06/10/2022 11:45 AM Adam [name]  MRN:  [id number] Subjective: No issues with Adam [name] today.  No complaints and states he is doing fine.  He has been compliant with his medications and denies any side effects.  Nurses report no issues.  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 No pertinent surgical history. Family History: History reviewed. No pertinent family history. Family Psychiatric  History: Unremarkable Social History:  Social History   Substance and Sexual Activity  Alcohol Use Not Currently     Social History   Substance and Sexual Activity  Drug Use Not Currently    Social History   Socioeconomic History   Marital status: Single    Spouse name: Not on file   Number of children: Not on file   Years of education: Not on file   Highest education level: Not on file  Occupational History   Not on file  Tobacco Use   Smoking status: Never    Passive exposure: Never   Smokeless tobacco: Never  Vaping Use   Vaping Use: Unknown  Substance and Sexual Activity   Alcohol use: Not Currently   Drug use: Not Currently   Sexual activity: Not Currently  Other Topics Concern   Not on file  Social History Narrative   Not on file   Social Determinants of Health   Financial Resource Strain: Not on file  Food Insecurity: Not on file  Transportation Needs: Not on  file  Physical Activity: Not on file  Stress: Not on file  Social Connections: Not on file   Additional Social History:  Specify valuables returned:  (none)                      Sleep: Good  Appetite:  Good  Current Medications: Current Facility-Administered Medications  Medication Dose Route Frequency Provider Last Rate Last Admin   acetaminophen (TYLENOL) tablet 650 mg  650 mg Oral Q6H PRN [name], [name], MD   650 mg at 05/31/22 1828   alum & mag hydroxide-simeth (MAALOX/MYLANTA) 200-200-20 MG/5ML suspension 30 mL  30 mL Oral Q4H PRN [name], [name], MD   30 mL at 04/26/22 4166   aspirin EC tablet 81 mg  81 mg Oral Daily [name], [name], MD   81 mg at 06/10/22 0819   atorvastatin (LIPITOR) tablet 20 mg  20 mg Oral Daily [name], [name], MD   20 mg at 06/10/22 0819   atropine 1 % ophthalmic solution 1 drop  1 drop Sublingual QID [name], MD   1 drop at 06/10/22 0819   benztropine (COGENTIN) tablet 0.5 mg  0.5 mg Oral BID [name], [name], MD   0.5 mg at 06/10/22 0819   clonazePAM (KLONOPIN) tablet 1 mg  1 mg Oral TID PRN He, [name], MD   1 mg at 06/09/22 2109   cloZAPine (CLOZARIL) tablet 300 mg  300 mg Oral  QHS [name], [name], MD   300 mg at 06/09/22 2109   diphenhydrAMINE (BENADRYL) capsule 50 mg  50 mg Oral Q6H PRN [name], [name], MD   50 mg at 06/06/22 1613   Or   diphenhydrAMINE (BENADRYL) injection 50 mg  50 mg Intramuscular Q6H PRN [name], [name], MD       gabapentin (NEURONTIN) capsule 300 mg  300 mg Oral TID [name], [name], MD   300 mg at 06/10/22 8101   haloperidol (HALDOL) tablet 5 mg  5 mg Oral TID [name], [name], MD   5 mg at 06/10/22 7510   haloperidol (HALDOL) tablet 5 mg  5 mg Oral Q6H PRN [name], [name], MD   5 mg at 05/03/22 1813   Or   haloperidol lactate (HALDOL) injection 5 mg  5 mg Intramuscular Q6H PRN [name], [name], MD       magnesium hydroxide (MILK OF MAGNESIA) suspension 30 mL  30 mL Oral Daily PRN [name], [name], MD   30 mL at 05/02/22  2585   metoprolol succinate (TOPROL-XL) 24 hr tablet 25 mg  25 mg Oral Daily [name], [name], MD   25 mg at 06/10/22 0819   paliperidone (INVEGA SUSTENNA) injection 156 mg  156 mg Intramuscular Q28 days [name], [name], MD   156 mg at 05/24/22 1123   temazepam (RESTORIL) capsule 15 mg  15 mg Oral QHS [name], [name], MD   15 mg at 06/09/22 2109   ziprasidone (GEODON) injection 20 mg  20 mg Intramuscular Q12H PRN [name], [name], MD   20 mg at 06/06/22 1735    Lab Results: No results found for this or any previous visit (from the past 48 hour(s)).  Blood Alcohol level:  Lab Results  Component Value Date   ETH <10 07/20/2021    Metabolic Disorder Labs: Lab Results  Component Value Date   HGBA1C 5.5 04/10/2022   MPG 111.15 04/10/2022   No results found for: "PROLACTIN" Lab Results  Component Value Date   CHOL 167 11/20/2021   TRIG 107 11/20/2021   HDL 26 (L) 11/20/2021   CHOLHDL 6.4 11/20/2021   VLDL 21 11/20/2021   LDLCALC 120 (H) 11/20/2021    Physical Findings: AIMS: Facial and Oral Movements Muscles of Facial Expression: None, normal Lips and Perioral Area: None, normal Jaw: None, normal Tongue: None, normal,Extremity Movements Upper (arms, wrists, hands, fingers): None, normal Lower (legs, knees, ankles, toes): None, normal, Trunk Movements Neck, shoulders, hips: None, normal, Overall Severity Severity of abnormal movements (highest score from questions above): None, normal Incapacitation due to abnormal movements: None, normal Patient's awareness of abnormal movements (rate only patient's report): No Awareness, Dental Status Current problems with teeth and/or dentures?: No Does patient usually wear dentures?: No  CIWA:    COWS:     Musculoskeletal: Strength & Muscle Tone: within normal limits Gait & Station: normal Patient leans: N/A  Psychiatric Specialty Exam:  Presentation  General Appearance: Appropriate for Environment; Casual; Neat; Well  Groomed  Eye Contact:Fair  Speech:Slow  Speech Volume:Decreased  Handedness:Left   Mood and Affect  Mood:Anxious  Affect:Congruent   Thought Process  Thought Processes:Goal Directed  Descriptions of Associations:Circumstantial  Orientation:Partial  Thought Content:Scattered  History of Schizophrenia/Schizoaffective disorder:Yes  Duration of Psychotic Symptoms:Greater than six months  Hallucinations:No data recorded Ideas of Reference:None  Suicidal Thoughts:No data recorded Homicidal Thoughts:No data recorded  Sensorium  Memory:Remote Good  Judgment:Fair  Insight:Fair   Executive Functions  Concentration:Fair  Attention Span:Fair  Recall:Fair  Fund of Knowledge:Fair  Language:Fair   Psychomotor Activity  Psychomotor Activity:No data recorded  Assets  Assets:Housing; Resilience; Social Support; Talents/Skills   Sleep  Sleep:No data recorded   Physical Exam: Physical Exam Vitals and nursing note reviewed.  Constitutional:      Appearance: Normal appearance. He is normal weight.  Neurological:     General: No focal deficit present.     Mental Status: He is alert and oriented to person, place, and time.  Psychiatric:        Mood and Affect: Mood normal.        Behavior: Behavior normal.    Review of Systems  Constitutional: Negative.   HENT: Negative.    Eyes: Negative.   Respiratory: Negative.    Cardiovascular: Negative.   Gastrointestinal: Negative.   Genitourinary: Negative.   Musculoskeletal: Negative.   Skin: Negative.   Neurological: Negative.   Endo/Heme/Allergies: Negative.   Psychiatric/Behavioral: Negative.     Blood pressure (!) 136/93, pulse 87, temperature 98 F (36.7 C), temperature source Oral, resp. rate 18, height 5\' 8"  (1.727 m), weight 85.8 kg, SpO2 100 %. Body mass index is 28.76 kg/m.   Treatment Plan Summary: Daily contact with patient to assess and evaluate symptoms and progress in treatment,  Medication management, and Plan continue current medications.  Marian Meneely , DO 06/10/2022, 11:45 AM

## 2022-06-11 NOTE — Progress Notes (Signed)
Pt presents with blunted affect, irritable mood with avertive eye contact. Oriented to self and place as he responds to his name and Hospital. Pt's mood remains labile with intermittent verbal outburst when demands for markers etc are not met. Continues to be disruptive in milieu, yelling at intervals. Received PRN Klonopin 1 mg PO at 0823 for agitation and anxiety as ordered with minimal effect when reassessed at 0920. Safety checks maintained with Q 15 minutes checks. All medications administered with verbal education and effects monitored. Pt remains medication compliant. Tolerates meals and fluids well. Attended scheduled groups and community meeting. Off unit for fresh air with peers and returned without issues.

## 2022-06-11 NOTE — Progress Notes (Signed)
Adam [name] is active on the unit. Walking the halls and hanging out in the dayroom.  He is med compliant and received meds without incident.  He denies si/hi/avh/pain and anxiety. He does endorse depression rates depression 4/10. 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  Will continue to monitor with q15 minute safety checks.      C Butler-Nicholson, LPN

## 2022-06-11 NOTE — [facility] Group Notes (Signed)
[facility] Group Notes:  (Nursing/MHT/Case Management/Adjunct)  Date:  06/11/2022  Time:  10:15 AM  Type of Therapy:   community meeting  Participation Level:  Minimal  Participation Quality:  Appropriate and Attentive  Affect:  Appropriate  Cognitive:  Alert and Appropriate  Insight:  Appropriate  Engagement in Group:  Engaged and Supportive  Modes of Intervention:  Discussion, Education, and Support  Summary of Progress/Problems:  Adam [name] 06/11/2022, 10:15 AM

## 2022-06-11 NOTE — Progress Notes (Signed)
[facility] MD Progress Note  06/11/2022 1:55 PM Adam [name]  MRN:  [id number] Subjective: Patient seen and chart reviewed.  No change to condition.  Patient remains with aphasia unable to communicate.  Behavior however has been overall well controlled.  I am told that last night he had a period of agitation that required as needed medicine.  This seems to often happen in the evening probably especially on weekends.  During the day he seems to be doing fine.  No change in physical condition.  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 No pertinent surgical history. Family History: History reviewed. No pertinent family history. Family Psychiatric  History: See previous Social History:  Social History   Substance and Sexual Activity  Alcohol Use Not Currently     Social History   Substance and Sexual Activity  Drug Use Not Currently    Social History   Socioeconomic History   Marital status: Single    Spouse name: Not on file   Number of children: Not on file   Years of education: Not on file   Highest education level: Not on file  Occupational History   Not on file  Tobacco Use   Smoking status: Never    Passive exposure: Never   Smokeless tobacco: Never  Vaping Use   Vaping Use: Unknown  Substance and Sexual  Activity   Alcohol use: Not Currently   Drug use: Not Currently   Sexual activity: Not Currently  Other Topics Concern   Not on file  Social History Narrative   Not on file   Social Determinants of Health   Financial Resource Strain: Not on file  Food Insecurity: Not on file  Transportation Needs: Not on file  Physical Activity: Not on file  Stress: Not on file  Social Connections: Not on file   Additional Social History:  Specify valuables returned:  (none)                      Sleep: Fair  Appetite:  Fair  Current Medications: Current Facility-Administered Medications  Medication Dose Route Frequency Provider Last Rate Last Admin   acetaminophen (TYLENOL) tablet 650 mg  650 mg Oral Q6H PRN Daniela Hernan T, MD   650 mg at 05/31/22 1828   alum & mag hydroxide-simeth (MAALOX/MYLANTA) 200-200-20 MG/5ML suspension 30 mL  30 mL Oral Q4H PRN Stellah Donovan T, MD   30 mL at 04/26/22 1950   aspirin EC tablet 81 mg  81 mg Oral Daily Ruford Dudzinski, [name], MD   81 mg at 06/11/22 9326   atorvastatin (LIPITOR) tablet 20 mg  20 mg Oral Daily Kimiyo Carmicheal T, MD   20 mg at 06/11/22 0821   atropine 1 % ophthalmic solution 1 drop  1 drop Sublingual QID [name], MD   1 drop at 06/11/22 1219  benztropine (COGENTIN) tablet 0.5 mg  0.5 mg Oral BID Nickola Lenig T, MD   0.5 mg at 06/11/22 6160   clonazePAM (KLONOPIN) tablet 1 mg  1 mg Oral TID PRN He, [name], MD   1 mg at 06/11/22 7371   cloZAPine (CLOZARIL) tablet 300 mg  300 mg Oral QHS Dereck Agerton T, MD   300 mg at 06/10/22 2121   diphenhydrAMINE (BENADRYL) capsule 50 mg  50 mg Oral Q6H PRN Siniyah Evangelist T, MD   50 mg at 06/06/22 1613   Or   diphenhydrAMINE (BENADRYL) injection 50 mg  50 mg Intramuscular Q6H PRN Temima Kutsch T, MD       gabapentin (NEURONTIN) capsule 300 mg  300 mg Oral TID Ciana Simmon T, MD   300 mg at 06/11/22 1219   haloperidol (HALDOL) tablet 5 mg  5 mg Oral TID Beza Steppe T, MD   5 mg at 06/11/22 1219    haloperidol (HALDOL) tablet 5 mg  5 mg Oral Q6H PRN Shandreka Dante T, MD   5 mg at 05/03/22 1813   Or   haloperidol lactate (HALDOL) injection 5 mg  5 mg Intramuscular Q6H PRN Julissa Browning T, MD       magnesium hydroxide (MILK OF MAGNESIA) suspension 30 mL  30 mL Oral Daily PRN Aneita Kiger T, MD   30 mL at 05/02/22 0626   metoprolol succinate (TOPROL-XL) 24 hr tablet 25 mg  25 mg Oral Daily Dezerae Freiberger T, MD   25 mg at 06/11/22 9485   paliperidone (INVEGA SUSTENNA) injection 156 mg  156 mg Intramuscular Q28 days Janssen Zee T, MD   156 mg at 05/24/22 1123   temazepam (RESTORIL) capsule 15 mg  15 mg Oral QHS Emelly Wurtz T, MD   15 mg at 06/10/22 2121   ziprasidone (GEODON) injection 20 mg  20 mg Intramuscular Q12H PRN Xzaria Teo, [name], MD   20 mg at 06/10/22 2244    Lab Results: No results found for this or any previous visit (from the past 48 hour(s)).  Blood Alcohol level:  Lab Results  Component Value Date   ETH <10 07/20/2021    Metabolic Disorder Labs: Lab Results  Component Value Date   HGBA1C 5.5 04/10/2022   MPG 111.15 04/10/2022   No results found for: "PROLACTIN" Lab Results  Component Value Date   CHOL 167 11/20/2021   TRIG 107 11/20/2021   HDL 26 (L) 11/20/2021   CHOLHDL 6.4 11/20/2021   VLDL 21 11/20/2021   LDLCALC 120 (H) 11/20/2021    Physical Findings: AIMS: Facial and Oral Movements Muscles of Facial Expression: None, normal Lips and Perioral Area: None, normal Jaw: None, normal Tongue: None, normal,Extremity Movements Upper (arms, wrists, hands, fingers): None, normal Lower (legs, knees, ankles, toes): None, normal, Trunk Movements Neck, shoulders, hips: None, normal, Overall Severity Severity of abnormal movements (highest score from questions above): None, normal Incapacitation due to abnormal movements: None, normal Patient's awareness of abnormal movements (rate only patient's report): No Awareness, Dental Status Current problems with teeth  and/or dentures?: No Does patient usually wear dentures?: No  CIWA:    COWS:     Musculoskeletal: Strength & Muscle Tone: within normal limits Gait & Station: normal Patient leans: N/A  Psychiatric Specialty Exam:  Presentation  General Appearance: Appropriate for Environment; Casual; Neat; Well Groomed  Eye Contact:Fair  Speech:Slow  Speech Volume:Decreased  Handedness:Left   Mood and Affect  Mood:Anxious  Affect:Congruent   Thought Process  Thought Processes:Goal Directed  Descriptions of Associations:Circumstantial  Orientation:Partial  Thought Content:Scattered  History of Schizophrenia/Schizoaffective disorder:Yes  Duration of Psychotic Symptoms:Greater than six months  Hallucinations:No data recorded Ideas of Reference:None  Suicidal Thoughts:No data recorded Homicidal Thoughts:No data recorded  Sensorium  Memory:Remote Good  Judgment:Fair  Insight:Fair   Executive Functions  Concentration:Fair  Attention Span:Fair  Recall:Fair  Fund of Knowledge:Fair  Language:Fair   Psychomotor Activity  Psychomotor Activity:No data recorded  Assets  Assets:Housing; Resilience; Social Support; Talents/Skills   Sleep  Sleep:No data recorded   Physical Exam: Physical Exam Vitals and nursing note reviewed.  Constitutional:      Appearance: Normal appearance.  HENT:     Head: Normocephalic and atraumatic.     Mouth/Throat:     Pharynx: Oropharynx is clear.  Eyes:     Pupils: Pupils are equal, round, and reactive to light.  Cardiovascular:     Rate and Rhythm: Normal rate and regular rhythm.  Pulmonary:     Effort: Pulmonary effort is normal.     Breath sounds: Normal breath sounds.  Abdominal:     General: Abdomen is flat.     Palpations: Abdomen is soft.  Musculoskeletal:        General: Normal range of motion.  Skin:    General: Skin is warm and dry.  Neurological:     General: No focal deficit present.     Mental Status:  He is alert. Mental status is at baseline.  Psychiatric:        Attention and Perception: He is inattentive.        Mood and Affect: Affect is blunt.        Speech: He is noncommunicative.    Review of Systems  Unable to perform ROS: Language  Neurological: Negative.    Blood pressure (!) 136/93, pulse 87, temperature 98 F (36.7 C), temperature source Oral, resp. rate 18, height 5\' 8"  (1.727 m), weight 85.8 kg, SpO2 100 %. Body mass index is 28.76 kg/m.   Treatment Plan Summary: Medication management and Plan no change to medication.  Supportive encouragement and friendly support to the patient.  Continue to primarily focus on placement if possible  , MD 06/11/2022, 1:55 PM

## 2022-06-11 NOTE — Progress Notes (Signed)
Patient alert and oriented x 3,  with periods of confusion to situation affect is blunted thoughts are disorganized, he appears irritable angry, not receptive to staff, he was upset yelling, banging on the windows,agitated and disruptive on the milieu and writer had to medicate him for agitation, 15 minutes safety checks maintained will continue to monitor.

## 2022-06-11 NOTE — Plan of Care (Signed)
  Problem: Education: Goal: Ability to state activities that reduce stress will improve Outcome: Progressing   Problem: Coping: Goal: Ability to identify and develop effective coping behavior will improve Outcome: Progressing   Problem: Self-Concept: Goal: Ability to identify factors that promote anxiety will improve Outcome: Progressing Goal: Level of anxiety will decrease Outcome: Progressing Goal: Ability to modify response to factors that promote anxiety will improve Outcome: Progressing   Problem: Skin Integrity: Goal: Risk for impaired skin integrity will decrease Outcome: Progressing   Problem: Safety: Goal: Ability to remain free from injury will improve Outcome: Progressing   Problem: Elimination: Goal: Will not experience complications related to bowel motility Outcome: Progressing Goal: Will not experience complications related to urinary retention Outcome: Progressing

## 2022-06-12 LAB — CBC WITH DIFFERENTIAL/PLATELET
Abs Immature Granulocytes: 0.01 10*3/uL (ref 0.00–0.07)
Basophils Absolute: 0.1 10*3/uL (ref 0.0–0.1)
Basophils Relative: 1 %
Eosinophils Absolute: 0 10*3/uL (ref 0.0–0.5)
Eosinophils Relative: 0 %
HCT: 41.8 % (ref 39.0–52.0)
Hemoglobin: 14 g/dL (ref 13.0–17.0)
Immature Granulocytes: 0 %
Lymphocytes Relative: 37 %
Lymphs Abs: 1.6 10*3/uL (ref 0.7–4.0)
MCH: 29 pg (ref 26.0–34.0)
MCHC: 33.5 g/dL (ref 30.0–36.0)
MCV: 86.7 fL (ref 80.0–100.0)
Monocytes Absolute: 0.4 10*3/uL (ref 0.1–1.0)
Monocytes Relative: 10 %
Neutro Abs: 2.2 10*3/uL (ref 1.7–7.7)
Neutrophils Relative %: 52 %
Platelets: 171 10*3/uL (ref 150–400)
RBC: 4.82 MIL/uL (ref 4.22–5.81)
RDW: 13.2 % (ref 11.5–15.5)
WBC: 4.3 10*3/uL (ref 4.0–10.5)
nRBC: 0 % (ref 0.0–0.2)

## 2022-06-12 NOTE — Progress Notes (Signed)
Patient is a little agitated at this time. Lab just called asking to draw his blood, and this writer stated that now would not be a good time. Lab stated that they would try again later today.

## 2022-06-12 NOTE — Progress Notes (Signed)
[facility] MD Progress Note  06/12/2022 2:45 PM Adam [name]  MRN:  [id number] Subjective: Follow-up 61 year old man with schizophrenia and stroke.  Patient was out of sorts today.  Shouting repeatedly earlier in the day.  Looked grumpy when I approached him.  Spent some time just standing with him listening offering him space to try to communicate.  He repeated the name "Witczak" several times and I ask him if he felt that people were calling him by the wrong name.  He seemed to be responding positively to that and I reassured him that I would gladly call him by his preferred feminine name.  He did not seem to have any other specific gripe although did seem to be irritated at at least one of the nurses.  Ultimately calm down and stop shouting and went back to his room. 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 No pertinent surgical history. Family History: History reviewed. No pertinent family history. Family Psychiatric  History: None Social History:  Social History   Substance and Sexual Activity  Alcohol Use Not Currently     Social History   Substance and Sexual Activity  Drug Use Not Currently    Social History   Socioeconomic History   Marital status: Single    Spouse name: Not on file   Number of children: Not on file   Years of education: Not on  file   Highest education level: Not on file  Occupational History   Not on file  Tobacco Use   Smoking status: Never    Passive exposure: Never   Smokeless tobacco: Never  Vaping Use   Vaping Use: Unknown  Substance and Sexual Activity   Alcohol use: Not Currently   Drug use: Not Currently   Sexual activity: Not Currently  Other Topics Concern   Not on file  Social History Narrative   Not on file   Social Determinants of Health   Financial Resource Strain: Not on file  Food Insecurity: Not on file  Transportation Needs: Not on file  Physical Activity: Not on file  Stress: Not on file  Social Connections: Not on file   Additional Social History:  Specify valuables returned:  (none)                      Sleep: Fair  Appetite:  Fair  Current Medications: Current Facility-Administered Medications  Medication Dose Route Frequency Provider Last Rate Last Admin   acetaminophen (TYLENOL) tablet 650 mg  650 mg Oral Q6H PRN Aarion Kittrell T, MD   650 mg at 05/31/22 1828   alum & mag hydroxide-simeth (MAALOX/MYLANTA) 200-200-20 MG/5ML suspension 30 mL  30 mL Oral Q4H PRN Erez Mccallum T, MD   30 mL at 04/26/22 0721   aspirin EC tablet 81 mg  81 mg Oral Daily Davide Risdon, [name], MD   81 mg at 06/12/22 [id number]  atorvastatin (LIPITOR) tablet 20 mg  20 mg Oral Daily Carlean Crowl T, MD   20 mg at 06/12/22 0825   atropine 1 % ophthalmic solution 1 drop  1 drop Sublingual QID [name], MD   1 drop at 06/12/22 1132   benztropine (COGENTIN) tablet 0.5 mg  0.5 mg Oral BID Naw Lasala T, MD   0.5 mg at 06/12/22 0630   clonazePAM (KLONOPIN) tablet 1 mg  1 mg Oral TID PRN He, [name], MD   1 mg at 06/12/22 1125   cloZAPine (CLOZARIL) tablet 300 mg  300 mg Oral QHS Oasis Goehring T, MD   300 mg at 06/11/22 2103   diphenhydrAMINE (BENADRYL) capsule 50 mg  50 mg Oral Q6H PRN Florabel Faulks T, MD   50 mg at 06/06/22 1613   Or   diphenhydrAMINE (BENADRYL) injection 50 mg  50 mg Intramuscular  Q6H PRN Lilly Gasser T, MD       gabapentin (NEURONTIN) capsule 300 mg  300 mg Oral TID Christianne Zacher T, MD   300 mg at 06/12/22 1235   haloperidol (HALDOL) tablet 5 mg  5 mg Oral TID Alyson Ki T, MD   5 mg at 06/12/22 1125   haloperidol (HALDOL) tablet 5 mg  5 mg Oral Q6H PRN Dashan Chizmar T, MD   5 mg at 05/03/22 1813   Or   haloperidol lactate (HALDOL) injection 5 mg  5 mg Intramuscular Q6H PRN Lucie Friedlander T, MD       magnesium hydroxide (MILK OF MAGNESIA) suspension 30 mL  30 mL Oral Daily PRN Benny Deutschman T, MD   30 mL at 05/02/22 1601   metoprolol succinate (TOPROL-XL) 24 hr tablet 25 mg  25 mg Oral Daily Illias Pantano T, MD   25 mg at 06/12/22 0824   paliperidone (INVEGA SUSTENNA) injection 156 mg  156 mg Intramuscular Q28 days Nathanie Ottley T, MD   156 mg at 05/24/22 1123   temazepam (RESTORIL) capsule 15 mg  15 mg Oral QHS Celestial Barnfield T, MD   15 mg at 06/11/22 2103   ziprasidone (GEODON) injection 20 mg  20 mg Intramuscular Q12H PRN Kynzie Polgar, [name], MD   20 mg at 06/12/22 1222    Lab Results: No results found for this or any previous visit (from the past 48 hour(s)).  Blood Alcohol level:  Lab Results  Component Value Date   ETH <10 07/20/2021    Metabolic Disorder Labs: Lab Results  Component Value Date   HGBA1C 5.5 04/10/2022   MPG 111.15 04/10/2022   No results found for: "PROLACTIN" Lab Results  Component Value Date   CHOL 167 11/20/2021   TRIG 107 11/20/2021   HDL 26 (L) 11/20/2021   CHOLHDL 6.4 11/20/2021   VLDL 21 11/20/2021   LDLCALC 120 (H) 11/20/2021    Physical Findings: AIMS: Facial and Oral Movements Muscles of Facial Expression: None, normal Lips and Perioral Area: None, normal Jaw: None, normal Tongue: None, normal,Extremity Movements Upper (arms, wrists, hands, fingers): None, normal Lower (legs, knees, ankles, toes): None, normal, Trunk Movements Neck, shoulders, hips: None, normal, Overall Severity Severity of abnormal movements  (highest score from questions above): None, normal Incapacitation due to abnormal movements: None, normal Patient's awareness of abnormal movements (rate only patient's report): No Awareness, Dental Status Current problems with teeth and/or dentures?: No Does patient usually wear dentures?: No  CIWA:    COWS:     Musculoskeletal: Strength & Muscle Tone: within normal limits  Gait & Station: normal Patient leans: N/A  Psychiatric Specialty Exam:  Presentation  General Appearance: Appropriate for Environment; Casual; Neat; Well Groomed  Eye Contact:Fair  Speech:Slow  Speech Volume:Decreased  Handedness:Left   Mood and Affect  Mood:Anxious  Affect:Congruent   Thought Process  Thought Processes:Goal Directed  Descriptions of Associations:Circumstantial  Orientation:Partial  Thought Content:Scattered  History of Schizophrenia/Schizoaffective disorder:Yes  Duration of Psychotic Symptoms:Greater than six months  Hallucinations:No data recorded Ideas of Reference:None  Suicidal Thoughts:No data recorded Homicidal Thoughts:No data recorded  Sensorium  Memory:Remote Good  Judgment:Fair  Insight:Fair   Executive Functions  Concentration:Fair  Attention Span:Fair  Recall:Fair  Fund of Knowledge:Fair  Language:Fair   Psychomotor Activity  Psychomotor Activity:No data recorded  Assets  Assets:Housing; Resilience; Social Support; Talents/Skills   Sleep  Sleep:No data recorded   Physical Exam: Physical Exam Vitals and nursing note reviewed.  Constitutional:      Appearance: Normal appearance.  HENT:     Head: Normocephalic and atraumatic.     Mouth/Throat:     Pharynx: Oropharynx is clear.  Eyes:     Pupils: Pupils are equal, round, and reactive to light.  Cardiovascular:     Rate and Rhythm: Normal rate and regular rhythm.  Pulmonary:     Effort: Pulmonary effort is normal.     Breath sounds: Normal breath sounds.  Abdominal:      General: Abdomen is flat.     Palpations: Abdomen is soft.  Musculoskeletal:        General: Normal range of motion.  Skin:    General: Skin is warm and dry.  Neurological:     General: No focal deficit present.     Mental Status: He is alert. Mental status is at baseline.  Psychiatric:        Attention and Perception: He is inattentive.        Mood and Affect: Affect is inappropriate.        Speech: He is noncommunicative.    Review of Systems  Unable to perform ROS: Language   Blood pressure 105/79, pulse 82, temperature 97.8 F (36.6 C), temperature source Oral, resp. rate 18, height 5\' 8"  (1.727 m), weight 85.8 kg, SpO2 100 %. Body mass index is 28.76 kg/m.   Treatment Plan Summary: Medication management and Plan no change to medication management.  Supportive counseling and support as noted above.  Obviously primary issue still is placement.  , MD 06/12/2022, 2:45 PM

## 2022-06-12 NOTE — Progress Notes (Signed)
Pt presented irritable with blunted affect, avertive eye contact on initial contact. However, he denies SI, HI, AVH "no, no" when assessed but easily frustrated when completing his menu. Escalated in his behavior verbally and physically aggressive (banging office window) when his phone was taken to be charge in locker room. Pt proceeded banging on doors, counter top in medication room "yellow, yellow, yellow, no, no" post PRN Klonopin at 1125. PRN Geodon 20 mg IM at 1222 with positive effect when reassessed at 1320. Scheduled Gabapentin given as ordered "Oh yeah, yellow" which was noted to be his trigger for his continued agitation "Yellow, Yellow" since it's a yellow capsule". Observed to be more redirectable and cooperative with care without further incident. Continued support, encouragement and reassurance offered. Safety checks maintained at Q 15 minutes intervals. Pt tolerates all PO intake and medications well. Phone returned to pt after charging.

## 2022-06-12 NOTE — Progress Notes (Signed)
Patient allowed Lab to draw his blood, without any issues.

## 2022-06-13 NOTE — Progress Notes (Signed)
D: Patient alert and oriented. Patient denies pain. Patient denies anxiety and depression. Patient denies SI/HI/AVH. Patient did not wake up until 1000. When patient woke up he was irritable and agitated looking for a charger. Patient would come up to the nurses station yelling "charger", banging on the nurses station windows, and yelling around the unit as he paces.  becoming increasingly agitated at staff members.  Patient later calmed down around 1400 and has not yelled again on the unit after spending time in bedroom.   A: Scheduled medications administered to patient, per MD orders.  Support and encouragement provided to patient.  Q15 minute safety checks maintained.   R: Patient compliant with medication administration and treatment plan. No adverse drug reactions noted. Patient remains safe on the unit at this time.

## 2022-06-13 NOTE — Progress Notes (Signed)
Patient's face and neck shaved by staff.   C Butler-Nicholson, LPN

## 2022-06-13 NOTE — Plan of Care (Signed)
  Problem: Nutrition: Goal: Adequate nutrition will be maintained Outcome: Progressing   Problem: Safety: Goal: Ability to remain free from injury will improve Outcome: Progressing   

## 2022-06-13 NOTE — Progress Notes (Signed)
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 No pertinent surgical history. Family History: History reviewed. No pertinent family history. Family Psychiatric  History: see prior Social History:  Social History   Substance and Sexual Activity  Alcohol Use Not Currently     Social History   Substance and Sexual Activity  Drug Use Not Currently    Social History   Socioeconomic History   Marital status: Single    Spouse name: Not on file   Number of children: Not on file   Years of education: Not on file   Highest education level: Not on file  Occupational History   Not on file  Tobacco Use   Smoking status: Never    Passive exposure: Never   Smokeless tobacco: Never  Vaping Use   Vaping Use: Unknown  Substance and Sexual Activity   Alcohol use: Not Currently   Drug use: Not Currently   Sexual activity: Not Currently  Other Topics Concern   Not on file  Social History Narrative   Not on file   Social Determinants of Health   Financial Resource Strain: Not on file  Food Insecurity: Not on file  Transportation Needs: Not on file  Physical Activity: Not on file  Stress: Not on file  Social Connections: Not on file   Additional Social History:  Specify valuables returned:   (none)                      Sleep: Fair  Appetite:  Fair  Current Medications: Current Facility-Administered Medications  Medication Dose Route Frequency Provider Last Rate Last Admin   acetaminophen (TYLENOL) tablet 650 mg  650 mg Oral Q6H PRN Gagandeep Kossman T, MD   650 mg at 05/31/22 1828   alum & mag hydroxide-simeth (MAALOX/MYLANTA) 200-200-20 MG/5ML suspension 30 mL  30 mL Oral Q4H PRN Shaqueta Casady T, MD   30 mL at 04/26/22 [id number]   aspirin EC tablet 81 mg  81 mg Oral Daily Nyles Mitton T, MD   81 mg at 06/13/22 1120   atorvastatin (LIPITOR) tablet 20 mg  20 mg Oral Daily Tim Wilhide T, MD   20 mg at 06/13/22 1119   atropine 1 % ophthalmic solution 1 drop  1 drop Sublingual QID [name], MD   1 drop at 06/13/22 1120   benztropine (COGENTIN) tablet 0.5 mg  0.5 mg Oral BID Marcee  T, MD   0.5 mg at 06/13/22 1120   clonazePAM (KLONOPIN) tablet 1 mg  1 mg Oral TID PRN He, [name], MD   1 mg at 06/12/22 1125   cloZAPine (CLOZARIL) tablet 300 mg  300 mg Oral QHS [name] Pio T, MD   300 mg at 06/12/22 2157   diphenhydrAMINE (BENADRYL) capsule 50 mg  50 mg Oral Q6H PRN Sherrel Shafer, [name],  MD   50 mg at 06/06/22 1613   Or   diphenhydrAMINE (BENADRYL) injection 50 mg  50 mg Intramuscular Q6H PRN Mackayla Mullins, [name], MD       gabapentin (NEURONTIN) capsule 300 mg  300 mg Oral TID Jacoya Bauman, [name], MD   300 mg at 06/13/22 1119   haloperidol (HALDOL) tablet 5 mg  5 mg Oral TID Pearl Bents, [name], MD   5 mg at 06/13/22 1120   haloperidol (HALDOL) tablet 5 mg  5 mg Oral Q6H PRN Jola Critzer, [name], MD   5 mg at 05/03/22 1813   Or   haloperidol lactate (HALDOL) injection 5 mg  5 mg Intramuscular Q6H PRN Hans Rusher, [name], MD       magnesium hydroxide (MILK OF MAGNESIA) suspension 30 mL  30 mL Oral Daily PRN Lashunda Greis T, MD   30 mL at 05/02/22 7096   metoprolol succinate (TOPROL-XL) 24 hr tablet 25 mg  25 mg Oral Daily Aayden Cefalu T, MD   25 mg at 06/13/22 1119   paliperidone (INVEGA SUSTENNA)  injection 156 mg  156 mg Intramuscular Q28 days Marcellis Frampton T, MD   156 mg at 05/24/22 1123   temazepam (RESTORIL) capsule 15 mg  15 mg Oral QHS Adoria Kawamoto T, MD   15 mg at 06/12/22 2157   ziprasidone (GEODON) injection 20 mg  20 mg Intramuscular Q12H PRN Shenise Wolgamott, [name], MD   20 mg at 06/12/22 1222    Lab Results:  Results for orders placed or performed during the hospital encounter of 03/19/22 (from the past 48 hour(s))  CBC with Differential/Platelet     Status: None   Collection Time: 06/12/22  3:06 PM  Result Value Ref Range   WBC 4.3 4.0 - 10.5 K/uL   RBC 4.82 4.22 - 5.81 MIL/uL   Hemoglobin 14.0 13.0 - 17.0 g/dL   HCT 28.3 66.2 - 94.7 %   MCV 86.7 80.0 - 100.0 fL   MCH 29.0 26.0 - 34.0 pg   MCHC 33.5 30.0 - 36.0 g/dL   RDW 65.4 65.0 - 35.4 %   Platelets 171 150 - 400 K/uL   nRBC 0.0 0.0 - 0.2 %   Neutrophils Relative % 52 %   Neutro Abs 2.2 1.7 - 7.7 K/uL   Lymphocytes Relative 37 %   Lymphs Abs 1.6 0.7 - 4.0 K/uL   Monocytes Relative 10 %   Monocytes Absolute 0.4 0.1 - 1.0 K/uL   Eosinophils Relative 0 %   Eosinophils Absolute 0.0 0.0 - 0.5 K/uL   Basophils Relative 1 %   Basophils Absolute 0.1 0.0 - 0.1 K/uL   Immature Granulocytes 0 %   Abs Immature Granulocytes 0.01 0.00 - 0.07 K/uL    Comment: Performed at [facility], [address] [place], [place] [address]    Blood Alcohol level:  Lab Results  Component Value Date   [facility] <10 07/20/2021    Metabolic Disorder Labs: Lab Results  Component Value Date   HGBA1C 5.5 04/10/2022   MPG 111.15 04/10/2022   No results found for: "PROLACTIN" Lab Results  Component Value Date   CHOL 167 11/20/2021   TRIG 107 11/20/2021   HDL 26 (L) 11/20/2021   CHOLHDL 6.4 11/20/2021   VLDL 21 11/20/2021   LDLCALC 120 (H) 11/20/2021    Physical Findings: AIMS: Facial and Oral Movements Muscles of Facial Expression: None, normal Lips and Perioral Area: None, normal Jaw: None, normal Tongue: None,  normal,Extremity Movements Upper (arms, wrists, hands, fingers): None, normal Lower (legs, knees, ankles, toes): None, normal, Trunk Movements Neck, shoulders, hips: None, normal, Overall Severity Severity of abnormal movements (highest score from questions above): None, normal Incapacitation due to abnormal movements: None, normal Patient's awareness of abnormal movements (rate only patient's report): No Awareness, Dental Status Current problems with teeth and/or dentures?: No Does patient usually wear dentures?: No  CIWA:    COWS:     Musculoskeletal: Strength & Muscle Tone: within normal limits Gait & Station: normal Patient leans: N/A  Psychiatric Specialty Exam:  Presentation  General Appearance: Appropriate for Environment; Casual; Neat; Well Groomed  Eye Contact:Fair  Speech:Slow  Speech Volume:Decreased  Handedness:Left   Mood and Affect  Mood:Anxious  Affect:Congruent   Thought Process  Thought Processes:Goal Directed  Descriptions of Associations:Circumstantial  Orientation:Partial  Thought Content:Scattered  History of Schizophrenia/Schizoaffective disorder:Yes  Duration of Psychotic Symptoms:Greater than six months  Hallucinations:No data recorded Ideas of Reference:None  Suicidal Thoughts:No data recorded Homicidal Thoughts:No data recorded  Sensorium  Memory:Remote Good  Judgment:Fair  Insight:Fair   Executive Functions  Concentration:Fair  Attention Span:Fair  Recall:Fair  Fund of Knowledge:Fair  Language:Fair   Psychomotor Activity  Psychomotor Activity:No data recorded  Assets  Assets:Housing; Resilience; Social Support; Talents/Skills   Sleep  Sleep:No data recorded   Physical Exam: Physical Exam Vitals and nursing note reviewed.  Constitutional:      Appearance: Normal appearance.  HENT:     Head: Normocephalic and atraumatic.     Mouth/Throat:     Pharynx: Oropharynx is clear.  Eyes:     Pupils:  Pupils are equal, round, and reactive to light.  Cardiovascular:     Rate and Rhythm: Normal rate and regular rhythm.  Pulmonary:     Effort: Pulmonary effort is normal.     Breath sounds: Normal breath sounds.  Abdominal:     General: Abdomen is flat.     Palpations: Abdomen is soft.  Musculoskeletal:        General: Normal range of motion.  Skin:    General: Skin is warm and dry.  Neurological:     General: No focal deficit present.     Mental Status: He is alert. Mental status is at baseline.  Psychiatric:        Attention and Perception: He is inattentive.        Mood and Affect: Affect is inappropriate.        Speech: He is noncommunicative.    Review of Systems  Unable to perform ROS: Language   Blood pressure 117/78, pulse 84, temperature 97.9 F (36.6 C), temperature source Oral, resp. rate 17, height 5\' 8"  (1.727 m), weight 85.8 kg, SpO2 99 %. Body mass index is 28.76 kg/m.   Treatment Plan Summary: Plan placement  , MD 06/13/2022, 1:45 PM

## 2022-06-13 NOTE — Progress Notes (Signed)
Recreation Therapy Notes   Date: 06/13/2022  Time: 10:40 am    Location: Courtyard       Behavioral response: N/A   Intervention Topic: Leisure    Discussion/Intervention: Patient refused to attend group.   Clinical Observations/Feedback:  Patient refused to attend group.    Ofilia Rayon LRT/CTRS        Elajah Kunsman 06/13/2022 12:11 PM

## 2022-06-13 NOTE — Progress Notes (Signed)
Adam [name] has been active on the unit. Labile mood at the beginning of shift, but brightened his mood and affect after shift change. Restless at times, pacing the halls. Maybe because other patients were pacing. He is med compliant and received his medication without incident.  Was not receptive to being told that he was no longer going to have the tablet. Staff tried to explain to him, that the tablet was for communication purposes and he has not been using it for its intended purposes.  Reminded him that he still had his phone that had the same capabilities that the tablet had so he would still be able to listen to his music and watch his shows.  He was very displeased, but did not put up too much of a fuss.     C Butler-Nicholson,LPN

## 2022-06-13 NOTE — Plan of Care (Signed)
  Problem: Education: Goal: Ability to state activities that reduce stress will improve Outcome: Progressing   Problem: Coping: Goal: Ability to identify and develop effective coping behavior will improve Outcome: Progressing   Problem: Self-Concept: Goal: Ability to identify factors that promote anxiety will improve Outcome: Progressing Goal: Level of anxiety will decrease Outcome: Progressing Goal: Ability to modify response to factors that promote anxiety will improve Outcome: Progressing   Problem: Education: Goal: Knowledge of General Education information will improve Description: Including pain rating scale, medication(s)/side effects and non-pharmacologic comfort measures Outcome: Progressing   Problem: Skin Integrity: Goal: Risk for impaired skin integrity will decrease Outcome: Progressing   Problem: Safety: Goal: Ability to remain free from injury will improve Outcome: Progressing   Problem: Pain Managment: Goal: General experience of comfort will improve Outcome: Progressing   Problem: Elimination: Goal: Will not experience complications related to bowel motility Outcome: Progressing Goal: Will not experience complications related to urinary retention Outcome: Progressing   

## 2022-06-13 NOTE — TOC Progression Note (Signed)
Transition of Care [facility]) - Progression Note    Patient Details  Name: Adam [name] MRN: [id number] Date of Birth: 31-Oct-1961  Transition of Care [facility] CM/SW Contact  Margarito Liner, LCSW Phone Number: 06/13/2022, 2:34 PM  Clinical Narrative:  Spoke with [facility] director. Patient is still on the wait list for all 3 neuromedical facilities.   Expected Discharge Plan and Services                                                 Social Determinants of Health (SDOH) Interventions    Readmission Risk Interventions     No data to display

## 2022-06-13 NOTE — Plan of Care (Signed)
  Problem: Education: Goal: Ability to state activities that reduce stress will improve Outcome: Progressing   Problem: Self-Concept: Goal: Ability to identify factors that promote anxiety will improve Outcome: Progressing Goal: Level of anxiety will decrease Outcome: Progressing Goal: Ability to modify response to factors that promote anxiety will improve Outcome: Progressing   

## 2022-06-14 NOTE — Progress Notes (Signed)
Pt did not receive morning dose of atropine, gabapentin, or haldol. Pt was sleeping and this Clinical research associate along with MHT attempted to wake pt multiple times for breakfast and medications. Pt would not wake up. MD was notified and is aware of medications not being given or not given on time and has approved medications being given at a later time as well as morning dose not being given for 3 times daily medications.

## 2022-06-14 NOTE — Progress Notes (Signed)
Patient had one aggressive episode with peer this shift. Noted yelling and calling peer asshole several times. Staff able to re-direct patient. Patient pleasant most of shift. Drawing and completing puzzles. Medication compliant. Appropriate with staff.  Encouragement and support provided. Safety checks maintained. Meds given as prescribed. Patient remains safe on unit with q 15 min checks.

## 2022-06-14 NOTE — Progress Notes (Signed)
Recreation Therapy Notes  Date: 06/14/2022   Time: 10:20 am     Location: Craft room         Behavioral response: N/A   Intervention Topic: Strengths    Discussion/Intervention: Patient refused to attend group.    Clinical Observations/Feedback:  Patient refused to attend group.    Sabriel Borromeo LRT/CTRS          Demerius Podolak 06/14/2022 10:56 AM 

## 2022-06-14 NOTE — Progress Notes (Signed)
[facility] MD Progress Note  06/14/2022 2:55 PM Adam [name]  MRN:  [id number] Subjective: Patient seen and chart reviewed.  No new complaints.  Still has episodes of loud vocalizing intermittently but no violence or aggression.  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 No pertinent surgical history. Family History: History reviewed. No pertinent family history. Family Psychiatric  History: See previous Social History:  Social History   Substance and Sexual Activity  Alcohol Use Not Currently     Social History   Substance and Sexual Activity  Drug Use Not Currently    Social History   Socioeconomic History   Marital status: Single    Spouse name: Not on file   Number of children: Not on file   Years of education: Not on file   Highest education level: Not on file  Occupational History   Not on file  Tobacco Use   Smoking status: Never    Passive exposure: Never   Smokeless tobacco: Never  Vaping Use   Vaping Use: Unknown  Substance and Sexual Activity   Alcohol use: Not Currently   Drug use: Not Currently   Sexual activity: Not Currently  Other Topics Concern   Not on file  Social History Narrative   Not on file   Social Determinants of Health   Financial Resource Strain: Not on file  Food Insecurity: Not on file   Transportation Needs: Not on file  Physical Activity: Not on file  Stress: Not on file  Social Connections: Not on file   Additional Social History:  Specify valuables returned:  (none)                      Sleep: Fair  Appetite:  Fair  Current Medications: Current Facility-Administered Medications  Medication Dose Route Frequency Provider Last Rate Last Admin   acetaminophen (TYLENOL) tablet 650 mg  650 mg Oral Q6H PRN Kiersten Coss T, MD   650 mg at 05/31/22 1828   alum & mag hydroxide-simeth (MAALOX/MYLANTA) 200-200-20 MG/5ML suspension 30 mL  30 mL Oral Q4H PRN Suesan Mohrmann T, MD   30 mL at 04/26/22 6578   aspirin EC tablet 81 mg  81 mg Oral Daily Elijio Staples T, MD   81 mg at 06/14/22 1124   atorvastatin (LIPITOR) tablet 20 mg  20 mg Oral Daily Vikram Tillett T, MD   20 mg at 06/14/22 1124   atropine 1 % ophthalmic solution 1 drop  1 drop Sublingual QID [name], MD   1 drop at 06/14/22 1128   benztropine (COGENTIN) tablet 0.5 mg  0.5 mg Oral BID Daiwik Buffalo T, MD   0.5 mg at 06/14/22 1124   clonazePAM (KLONOPIN) tablet 1 mg  1 mg Oral TID PRN He, [name], MD   1 mg at 06/12/22 1125   cloZAPine (CLOZARIL) tablet 300 mg  300 mg  Oral QHS Brynn Mulgrew, [name], MD   300 mg at 06/13/22 2103   diphenhydrAMINE (BENADRYL) capsule 50 mg  50 mg Oral Q6H PRN Banks Chaikin, [name], MD   50 mg at 06/06/22 1613   Or   diphenhydrAMINE (BENADRYL) injection 50 mg  50 mg Intramuscular Q6H PRN Favian Kittleson, [name], MD       gabapentin (NEURONTIN) capsule 300 mg  300 mg Oral TID Esbeidy Mclaine, [name], MD   300 mg at 06/14/22 1123   haloperidol (HALDOL) tablet 5 mg  5 mg Oral TID Eliyas Suddreth, [name], MD   5 mg at 06/14/22 1124   haloperidol (HALDOL) tablet 5 mg  5 mg Oral Q6H PRN Sunnie Odden, [name], MD   5 mg at 05/03/22 1813   Or   haloperidol lactate (HALDOL) injection 5 mg  5 mg Intramuscular Q6H PRN Quetzally Callas, [name], MD       magnesium hydroxide (MILK OF MAGNESIA) suspension 30 mL  30 mL Oral Daily PRN Zeki Bedrosian,  [name], MD   30 mL at 05/02/22 7106   metoprolol succinate (TOPROL-XL) 24 hr tablet 25 mg  25 mg Oral Daily Vayla Wilhelmi T, MD   25 mg at 06/14/22 1124   paliperidone (INVEGA SUSTENNA) injection 156 mg  156 mg Intramuscular Q28 days Blaze Sandin T, MD   156 mg at 05/24/22 1123   temazepam (RESTORIL) capsule 15 mg  15 mg Oral QHS Verbena Boeding T, MD   15 mg at 06/13/22 2103   ziprasidone (GEODON) injection 20 mg  20 mg Intramuscular Q12H PRN Terressa Evola, [name], MD   20 mg at 06/12/22 1222    Lab Results:  Results for orders placed or performed during the hospital encounter of 03/19/22 (from the past 48 hour(s))  CBC with Differential/Platelet     Status: None   Collection Time: 06/12/22  3:06 PM  Result Value Ref Range   WBC 4.3 4.0 - 10.5 K/uL   RBC 4.82 4.22 - 5.81 MIL/uL   Hemoglobin 14.0 13.0 - 17.0 g/dL   HCT 26.9 48.5 - 46.2 %   MCV 86.7 80.0 - 100.0 fL   MCH 29.0 26.0 - 34.0 pg   MCHC 33.5 30.0 - 36.0 g/dL   RDW 70.3 50.0 - 93.8 %   Platelets 171 150 - 400 K/uL   nRBC 0.0 0.0 - 0.2 %   Neutrophils Relative % 52 %   Neutro Abs 2.2 1.7 - 7.7 K/uL   Lymphocytes Relative 37 %   Lymphs Abs 1.6 0.7 - 4.0 K/uL   Monocytes Relative 10 %   Monocytes Absolute 0.4 0.1 - 1.0 K/uL   Eosinophils Relative 0 %   Eosinophils Absolute 0.0 0.0 - 0.5 K/uL   Basophils Relative 1 %   Basophils Absolute 0.1 0.0 - 0.1 K/uL   Immature Granulocytes 0 %   Abs Immature Granulocytes 0.01 0.00 - 0.07 K/uL    Comment: Performed at [facility], [address] [place], [place] [address]    Blood Alcohol level:  Lab Results  Component Value Date   [facility] <10 07/20/2021    Metabolic Disorder Labs: Lab Results  Component Value Date   HGBA1C 5.5 04/10/2022   MPG 111.15 04/10/2022   No results found for: "PROLACTIN" Lab Results  Component Value Date   CHOL 167 11/20/2021   TRIG 107 11/20/2021   HDL 26 (L) 11/20/2021   CHOLHDL 6.4 11/20/2021   VLDL 21 11/20/2021   LDLCALC 120 (H)  11/20/2021    Physical Findings: AIMS: Facial and Oral Movements Muscles of Facial Expression: None, normal Lips and Perioral Area: None, normal Jaw: None, normal Tongue: None, normal,Extremity Movements Upper (arms, wrists, hands, fingers): None, normal Lower (legs, knees, ankles, toes): None, normal, Trunk Movements Neck, shoulders, hips: None, normal, Overall Severity Severity of abnormal movements (highest score from questions above): None, normal Incapacitation due to abnormal movements: None, normal Patient's awareness of abnormal movements (rate only patient's report): No Awareness, Dental Status Current problems with teeth and/or dentures?: No Does patient usually wear dentures?: No  CIWA:    COWS:     Musculoskeletal: Strength & Muscle Tone: within normal limits Gait & Station: normal Patient leans: N/A  Psychiatric Specialty Exam:  Presentation  General Appearance: Appropriate for Environment; Casual; Neat; Well Groomed  Eye Contact:Fair  Speech:Slow  Speech Volume:Decreased  Handedness:Left   Mood and Affect  Mood:Anxious  Affect:Congruent   Thought Process  Thought Processes:Goal Directed  Descriptions of Associations:Circumstantial  Orientation:Partial  Thought Content:Scattered  History of Schizophrenia/Schizoaffective disorder:Yes  Duration of Psychotic Symptoms:Greater than six months  Hallucinations:No data recorded Ideas of Reference:None  Suicidal Thoughts:No data recorded Homicidal Thoughts:No data recorded  Sensorium  Memory:Remote Good  Judgment:Fair  Insight:Fair   Executive Functions  Concentration:Fair  Attention Span:Fair  Recall:Fair  Fund of Knowledge:Fair  Language:Fair   Psychomotor Activity  Psychomotor Activity:No data recorded  Assets  Assets:Housing; Resilience; Social Support; Talents/Skills   Sleep  Sleep:No data recorded   Physical Exam: Physical Exam Vitals and nursing note reviewed.   Constitutional:      Appearance: Normal appearance.  HENT:     Head: Normocephalic and atraumatic.     Mouth/Throat:     Pharynx: Oropharynx is clear.  Eyes:     Pupils: Pupils are equal, round, and reactive to light.  Cardiovascular:     Rate and Rhythm: Normal rate and regular rhythm.  Pulmonary:     Effort: Pulmonary effort is normal.     Breath sounds: Normal breath sounds.  Abdominal:     General: Abdomen is flat.     Palpations: Abdomen is soft.  Musculoskeletal:        General: Normal range of motion.  Skin:    General: Skin is warm and dry.  Neurological:     General: No focal deficit present.     Mental Status: He is alert. Mental status is at baseline.  Psychiatric:        Attention and Perception: He is inattentive.        Mood and Affect: Affect is inappropriate.        Speech: He is noncommunicative.    Review of Systems  Unable to perform ROS: Language  Gastrointestinal: Negative.    Blood pressure (!) 137/111, pulse 86, temperature 97.8 F (36.6 C), temperature source Oral, resp. rate 17, height 5\' 8"  (1.727 m), weight 85.8 kg, SpO2 100 %. Body mass index is 28.76 kg/m.   Treatment Plan Summary: Medication management and Plan no change to medication management or treatment.  Supportive encouragement.  Continual monitoring.  Blood pressure running a little high today I will keep an eye on that it tends to go up and down.  Still looking for placement  , MD 06/14/2022, 2:55 PM

## 2022-06-14 NOTE — Progress Notes (Signed)
Patient calm and pleasant. Observed interacting appropriately with staff and peers on the unit. Pt compliant with medication administration per MD orders. Pt given support and encouragement. Pt being monitored Q 15 minutes for safety per unit protocol. Pt remains safe on the unit.  

## 2022-06-14 NOTE — Plan of Care (Signed)
D: Pt alert and oriented. Pt denies experiencing any anxiety/depression at this time. Pt denies experiencing any pain at this time. Pt denies experiencing any SI/HI, or AVH at this time.   A: Scheduled medications administered to pt, per MD orders. Support and encouragement provided. Frequent verbal contact made. Routine safety checks conducted q15 minutes.   Pt observed as irritable/depressed/sad late afternoon around 1600. Pt was outside MD's office shouting [place]. When asked if that's where he came from the pt stated yeah. With verbal de-escalation, redirection, and diversion patient is able to calm down.  R: No adverse drug reactions noted. Pt verbally contracts for safety at this time. Pt compliant with medications. Pt interacts minimally with others on the unit. Pt remains safe at this time. Will continue to monitor.   Problem: Education: Goal: Ability to state activities that reduce stress will improve Outcome: Not Progressing   Problem: Coping: Goal: Ability to identify and develop effective coping behavior will improve Outcome: Not Progressing

## 2022-06-15 NOTE — Progress Notes (Signed)
[name] MD Progress Note  06/15/2022 4:15 PM Adam [name]  MRN:  [id number] Subjective: Follow-up patient with schizophrenia and cognitive problems particularly aphasia from a history of a stroke.  He seemed in good spirits today.  He was laughing when I came in to see him.  He was repeating the same word that did not make any sense to me in the context but otherwise seemed to be attentive and expressing feeling okay.  No behavior problems today. 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 No pertinent surgical history. Family History: History reviewed. No pertinent family history. Family Psychiatric  History: See previous Social History:  Social History   Substance and Sexual Activity  Alcohol Use Not Currently     Social History   Substance and Sexual Activity  Drug Use Not Currently    Social History   Socioeconomic History   Marital status: Single    Spouse name: Not on file   Number of children: Not on file   Years of education: Not on file   Highest education level: Not on file  Occupational History   Not on file  Tobacco Use   Smoking status: Never    Passive exposure: Never   Smokeless tobacco: Never  Vaping Use   Vaping Use: Unknown  Substance and Sexual Activity   Alcohol use: Not Currently   Drug use: Not Currently   Sexual activity: Not  Currently  Other Topics Concern   Not on file  Social History Narrative   Not on file   Social Determinants of Health   Financial Resource Strain: Not on file  Food Insecurity: Not on file  Transportation Needs: Not on file  Physical Activity: Not on file  Stress: Not on file  Social Connections: Not on file   Additional Social History:  Specify valuables returned:  (none)                      Sleep: Fair  Appetite:  Fair  Current Medications: Current Facility-Administered Medications  Medication Dose Route Frequency Provider Last Rate Last Admin   acetaminophen (TYLENOL) tablet 650 mg  650 mg Oral Q6H PRN Gianni Mihalik, [name], MD   650 mg at 06/15/22 0817   alum & mag hydroxide-simeth (MAALOX/MYLANTA) 200-200-20 MG/5ML suspension 30 mL  30 mL Oral Q4H PRN Abdoulie Tierce T, MD   30 mL at 04/26/22 2703   aspirin EC tablet 81 mg  81 mg Oral Daily Jaliah Foody, [name], MD   81 mg at 06/15/22 0817   atorvastatin (LIPITOR) tablet 20 mg  20 mg Oral Daily Hannia Matchett T, MD   20 mg at 06/15/22 0817   atropine 1 % ophthalmic solution 1 drop  1 drop Sublingual QID [name], MD   1 drop at 06/15/22 1226   benztropine (COGENTIN) tablet 0.5 mg  0.5 mg Oral BID Taylor Spilde, [name], MD  0.5 mg at 06/15/22 0817   clonazePAM (KLONOPIN) tablet 1 mg  1 mg Oral TID PRN He, [name], MD   1 mg at 06/14/22 1547   cloZAPine (CLOZARIL) tablet 300 mg  300 mg Oral QHS Nethan Caudillo T, MD   300 mg at 06/14/22 2119   diphenhydrAMINE (BENADRYL) capsule 50 mg  50 mg Oral Q6H PRN Willy Vorce T, MD   50 mg at 06/06/22 1613   Or   diphenhydrAMINE (BENADRYL) injection 50 mg  50 mg Intramuscular Q6H PRN Kristan Votta T, MD       gabapentin (NEURONTIN) capsule 300 mg  300 mg Oral TID Trixie Maclaren T, MD   300 mg at 06/15/22 1226   haloperidol (HALDOL) tablet 5 mg  5 mg Oral TID Caroline Matters T, MD   5 mg at 06/15/22 1226   haloperidol (HALDOL) tablet 5 mg  5 mg Oral Q6H PRN Selicia Windom T, MD   5 mg at 05/03/22  1813   Or   haloperidol lactate (HALDOL) injection 5 mg  5 mg Intramuscular Q6H PRN Quillan Whitter T, MD       magnesium hydroxide (MILK OF MAGNESIA) suspension 30 mL  30 mL Oral Daily PRN Jalayla Chrismer T, MD   30 mL at 05/02/22 5621   metoprolol succinate (TOPROL-XL) 24 hr tablet 25 mg  25 mg Oral Daily Andrez Lieurance T, MD   25 mg at 06/15/22 0817   paliperidone (INVEGA SUSTENNA) injection 156 mg  156 mg Intramuscular Q28 days Raoul Ciano T, MD   156 mg at 05/24/22 1123   temazepam (RESTORIL) capsule 15 mg  15 mg Oral QHS Mayana Irigoyen T, MD   15 mg at 06/14/22 2119   ziprasidone (GEODON) injection 20 mg  20 mg Intramuscular Q12H PRN Drystan Reader, [name], MD   20 mg at 06/12/22 1222    Lab Results: No results found for this or any previous visit (from the past 48 hour(s)).  Blood Alcohol level:  Lab Results  Component Value Date   ETH <10 07/20/2021    Metabolic Disorder Labs: Lab Results  Component Value Date   HGBA1C 5.5 04/10/2022   MPG 111.15 04/10/2022   No results found for: "PROLACTIN" Lab Results  Component Value Date   CHOL 167 11/20/2021   TRIG 107 11/20/2021   HDL 26 (L) 11/20/2021   CHOLHDL 6.4 11/20/2021   VLDL 21 11/20/2021   LDLCALC 120 (H) 11/20/2021    Physical Findings: AIMS: Facial and Oral Movements Muscles of Facial Expression: None, normal Lips and Perioral Area: None, normal Jaw: None, normal Tongue: None, normal,Extremity Movements Upper (arms, wrists, hands, fingers): None, normal Lower (legs, knees, ankles, toes): None, normal, Trunk Movements Neck, shoulders, hips: None, normal, Overall Severity Severity of abnormal movements (highest score from questions above): None, normal Incapacitation due to abnormal movements: None, normal Patient's awareness of abnormal movements (rate only patient's report): No Awareness, Dental Status Current problems with teeth and/or dentures?: No Does patient usually wear dentures?: No  CIWA:    COWS:      Musculoskeletal: Strength & Muscle Tone: within normal limits Gait & Station: normal Patient leans: N/A  Psychiatric Specialty Exam:  Presentation  General Appearance: Appropriate for Environment; Casual; Neat; Well Groomed  Eye Contact:Fair  Speech:Slow  Speech Volume:Decreased  Handedness:Left   Mood and Affect  Mood:Anxious  Affect:Congruent   Thought Process  Thought Processes:Goal Directed  Descriptions of Associations:Circumstantial  Orientation:Partial  Thought Content:Scattered  History of Schizophrenia/Schizoaffective  disorder:Yes  Duration of Psychotic Symptoms:Greater than six months  Hallucinations:No data recorded Ideas of Reference:None  Suicidal Thoughts:No data recorded Homicidal Thoughts:No data recorded  Sensorium  Memory:Remote Good  Judgment:Fair  Insight:Fair   Executive Functions  Concentration:Fair  Attention Span:Fair  Recall:Fair  Fund of Knowledge:Fair  Language:Fair   Psychomotor Activity  Psychomotor Activity:No data recorded  Assets  Assets:Housing; Resilience; Social Support; Talents/Skills   Sleep  Sleep:No data recorded   Physical Exam: Physical Exam Vitals and nursing note reviewed.  Constitutional:      Appearance: Normal appearance.  HENT:     Head: Normocephalic and atraumatic.     Mouth/Throat:     Pharynx: Oropharynx is clear.  Eyes:     Pupils: Pupils are equal, round, and reactive to light.  Cardiovascular:     Rate and Rhythm: Normal rate and regular rhythm.  Pulmonary:     Effort: Pulmonary effort is normal.     Breath sounds: Normal breath sounds.  Abdominal:     General: Abdomen is flat.     Palpations: Abdomen is soft.  Musculoskeletal:        General: Normal range of motion.  Skin:    General: Skin is warm and dry.  Neurological:     General: No focal deficit present.     Mental Status: He is alert. Mental status is at baseline.  Psychiatric:        Attention and  Perception: He is inattentive.        Mood and Affect: Affect is inappropriate.        Speech: He is noncommunicative.    Review of Systems  Unable to perform ROS: Language   Blood pressure 116/82, pulse 91, temperature 97.8 F (36.6 C), temperature source Oral, resp. rate 18, height 5\' 8"  (1.727 m), weight 85.8 kg, SpO2 100 %. Body mass index is 28.76 kg/m.   Treatment Plan Summary: Medication management and Plan continue current medication.  Informed patient that unfortunately I have no news of any updates in placement but we are still working on it.  , MD 06/15/2022, 4:15 PM

## 2022-06-15 NOTE — Plan of Care (Signed)
D: Pt alert and oriented. Pt rates hopelessness 10/10. Pt reports energy level as high and concentration as being good. Pt reports sleep last night as being good. Pt did receive medications for sleep and did find them helpful. Pt reports experiencing lower back pain at this time, prn medication given. Pt denies experiencing any SI/HI, or AVH at this time.   A: Scheduled medications administered to pt, per MD orders. Support and encouragement provided. Frequent verbal contact made. Routine safety checks conducted q15 minutes.   R: No adverse drug reactions noted. Pt verbally contracts for safety at this time. Pt compliant with medications. Pt interacts minimally with others on the unit. Pt remains safe at this time. Will continue to monitor.   Problem: Activity: Goal: Risk for activity intolerance will decrease Outcome: Progressing   Problem: Nutrition: Goal: Adequate nutrition will be maintained Outcome: Progressing

## 2022-06-15 NOTE — Progress Notes (Signed)
Recreation Therapy Notes  Date: 06/15/2022   Time: 10:40 am     Location: Craft room         Behavioral response: N/A   Intervention Topic: Values  Discussion/Intervention: Patient refused to attend group.    Clinical Observations/Feedback:  Patient refused to attend group.    Rhayne Chatwin LRT/CTRS        Linwood Gullikson 06/15/2022 11:02 AM

## 2022-06-16 NOTE — Progress Notes (Signed)
Patient alert and oriented x 4 no distress noted he is interacting appropriately with peers and staff, no distress noted, he was complaint with medication this shift. 15 minutes safety checks maintained will closely monitor.

## 2022-06-16 NOTE — Progress Notes (Signed)
Patient alert and oriented. Patient verbalized no issues today. No aggressive behaviors noted this shift. Compliant with medications. Appetite and energy level good. Safety maintained with 15 min checks.

## 2022-06-16 NOTE — Progress Notes (Signed)
[facility] MD Progress Note  06/16/2022 9:17 AM Adam [name]  MRN:  [id number] Subjective:  Patient denies having any issues today.  No negative behaviors, per past staff and nursing report.  She is taking his medications.  Feels okay.  Nods yes when asked if he slept well last night.  Expresses no other concerns.  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 No pertinent surgical history. Family History: History reviewed. No pertinent family history. Family Psychiatric  History: See previous Social History:  Social History   Substance and Sexual Activity  Alcohol Use Not Currently     Social History   Substance and Sexual Activity  Drug Use Not Currently    Social History   Socioeconomic History   Marital status: Single    Spouse name: Not on file   Number of children: Not on file   Years of education: Not on file   Highest education level: Not on file  Occupational History   Not on file  Tobacco Use   Smoking status: Never    Passive exposure: Never   Smokeless tobacco: Never  Vaping Use   Vaping Use: Unknown  Substance and Sexual Activity   Alcohol use: Not Currently   Drug use: Not Currently   Sexual activity: Not Currently  Other Topics Concern   Not on file  Social History Narrative   Not on file   Social Determinants of Health   Financial  Resource Strain: Not on file  Food Insecurity: Not on file  Transportation Needs: Not on file  Physical Activity: Not on file  Stress: Not on file  Social Connections: Not on file   Additional Social History:  Specify valuables returned:  (none)                      Sleep: Fair  Appetite:  Fair  Current Medications: Current Facility-Administered Medications  Medication Dose Route Frequency Provider Last Rate Last Admin   acetaminophen (TYLENOL) tablet 650 mg  650 mg Oral Q6H PRN [name], [name], MD   650 mg at 06/15/22 0817   alum & mag hydroxide-simeth (MAALOX/MYLANTA) 200-200-20 MG/5ML suspension 30 mL  30 mL Oral Q4H PRN [name], [name], MD   30 mL at 04/26/22 0350   aspirin EC tablet 81 mg  81 mg Oral Daily [name], [name], MD   81 mg at 06/15/22 0817   atorvastatin (LIPITOR) tablet 20 mg  20 mg Oral Daily [name], [name], MD   20 mg at 06/15/22 0817   atropine 1 % ophthalmic solution 1 drop  1 drop Sublingual QID [name], MD   1 drop at 06/15/22 2113   benztropine (COGENTIN) tablet 0.5 mg  0.5 mg Oral BID [name], [name], MD   0.5 mg at 06/15/22 1621   clonazePAM (KLONOPIN) tablet 1 mg  1 mg Oral TID PRN He, [name], MD  1 mg at 06/15/22 2114   cloZAPine (CLOZARIL) tablet 300 mg  300 mg Oral QHS [name], [name], MD   300 mg at 06/15/22 2112   diphenhydrAMINE (BENADRYL) capsule 50 mg  50 mg Oral Q6H PRN [name], [name], MD   50 mg at 06/06/22 1613   Or   diphenhydrAMINE (BENADRYL) injection 50 mg  50 mg Intramuscular Q6H PRN [name], [name], MD       gabapentin (NEURONTIN) capsule 300 mg  300 mg Oral TID [name], [name], MD   300 mg at 06/15/22 1621   haloperidol (HALDOL) tablet 5 mg  5 mg Oral TID [name], [name], MD   5 mg at 06/15/22 1621   haloperidol (HALDOL) tablet 5 mg  5 mg Oral Q6H PRN [name], [name], MD   5 mg at 06/15/22 2112   Or   haloperidol lactate (HALDOL) injection 5 mg  5 mg Intramuscular Q6H PRN [name], [name], MD       magnesium hydroxide (MILK  OF MAGNESIA) suspension 30 mL  30 mL Oral Daily PRN [name], [name], MD   30 mL at 05/02/22 4259   metoprolol succinate (TOPROL-XL) 24 hr tablet 25 mg  25 mg Oral Daily [name], [name], MD   25 mg at 06/15/22 0817   paliperidone (INVEGA SUSTENNA) injection 156 mg  156 mg Intramuscular Q28 days [name], [name], MD   156 mg at 05/24/22 1123   temazepam (RESTORIL) capsule 15 mg  15 mg Oral QHS [name], [name], MD   15 mg at 06/15/22 2112   ziprasidone (GEODON) injection 20 mg  20 mg Intramuscular Q12H PRN [name], [name], MD   20 mg at 06/12/22 1222    Lab Results: No results found for this or any previous visit (from the past 48 hour(s)).  Blood Alcohol level:  Lab Results  Component Value Date   ETH <10 07/20/2021    Metabolic Disorder Labs: Lab Results  Component Value Date   HGBA1C 5.5 04/10/2022   MPG 111.15 04/10/2022   No results found for: "PROLACTIN" Lab Results  Component Value Date   CHOL 167 11/20/2021   TRIG 107 11/20/2021   HDL 26 (L) 11/20/2021   CHOLHDL 6.4 11/20/2021   VLDL 21 11/20/2021   LDLCALC 120 (H) 11/20/2021    Physical Findings: AIMS: Facial and Oral Movements Muscles of Facial Expression: None, normal Lips and Perioral Area: None, normal Jaw: None, normal Tongue: None, normal,Extremity Movements Upper (arms, wrists, hands, fingers): None, normal Lower (legs, knees, ankles, toes): None, normal, Trunk Movements Neck, shoulders, hips: None, normal, Overall Severity Severity of abnormal movements (highest score from questions above): None, normal Incapacitation due to abnormal movements: None, normal Patient's awareness of abnormal movements (rate only patient's report): No Awareness, Dental Status Current problems with teeth and/or dentures?: No Does patient usually wear dentures?: No  CIWA:    COWS:     Musculoskeletal: Strength & Muscle Tone: within normal limits Gait & Station: normal Patient leans: N/A  Psychiatric Specialty  Exam:  Presentation  General Appearance: Appropriate for Environment; Casual; Neat; Well Groomed  Eye Contact:Fair  Speech:Slow  Speech Volume:Decreased  Handedness:Left   Mood and Affect  Mood:Anxious  Affect:Congruent   Thought Process  Thought Processes:Goal Directed  Descriptions of Associations:Circumstantial  Orientation:Partial  Thought Content:Scattered  History of Schizophrenia/Schizoaffective disorder:Yes  Duration of Psychotic Symptoms:Greater than six months  Hallucinations:No data recorded Ideas of Reference:None  Suicidal Thoughts:No data recorded Homicidal Thoughts:No  data recorded  Sensorium  Memory:Remote Good  Judgment:Fair  Insight:Fair   Executive Functions  Concentration:Fair  Attention Span:Fair  Recall:Fair  Fund of Knowledge:Fair  Language:Fair   Psychomotor Activity  Psychomotor Activity:No data recorded  Assets  Assets:Housing; Resilience; Social Support; Talents/Skills   Sleep  Sleep:No data recorded   Physical Exam: Physical Exam Vitals and nursing note reviewed.  Constitutional:      Appearance: Normal appearance.  HENT:     Head: Normocephalic and atraumatic.     Mouth/Throat:     Pharynx: Oropharynx is clear.  Eyes:     Pupils: Pupils are equal, round, and reactive to light.  Cardiovascular:     Rate and Rhythm: Normal rate and regular rhythm.  Pulmonary:     Effort: Pulmonary effort is normal.     Breath sounds: Normal breath sounds.  Abdominal:     General: Abdomen is flat.     Palpations: Abdomen is soft.  Musculoskeletal:        General: Normal range of motion.  Skin:    General: Skin is warm and dry.  Neurological:     General: No focal deficit present.     Mental Status: He is alert. Mental status is at baseline.  Psychiatric:        Attention and Perception: He is inattentive.        Mood and Affect: Affect is inappropriate.        Speech: He is noncommunicative.    Review of  Systems  Unable to perform ROS: Language   Blood pressure 116/82, pulse 91, temperature 97.8 F (36.6 C), temperature source Oral, resp. rate 18, height 5\' 8"  (1.727 m), weight 85.8 kg, SpO2 100 %. Body mass index is 28.76 kg/m.   Treatment Plan Summary: Medication management and Plan continue current medication.  Informed patient that unfortunately I have no news of any updates in placement but we are still working on it.  7/15 No changes  8/15, MD 06/16/2022, 9:17 AM

## 2022-06-17 NOTE — Progress Notes (Signed)
Patient alert and oriented x 4 no distress noted he is interacting appropriately with peers and staff, no distress noted, he was complaint with medication this shift, no loud outburst.  15 minutes safety checks maintained will closely monitor.

## 2022-06-17 NOTE — Plan of Care (Signed)
Pt is generally compliant; Pt did not get out of bed despite several attempts to awake him until nearly 11AM and took his morning meds then.  Pt was in a generally good mood with the exception of when he was not allowed to have the tablet.  Pt affect was jovial toward the end of the shift.  Denies SI HI, AVH.

## 2022-06-17 NOTE — Progress Notes (Addendum)
[facility] MD Progress Note  06/17/2022 8:29 AM Adam [name]  MRN:  [id number] Subjective:   7/16 Patient same in the afternoon yesterday which is not unusual for him.  No behavioral concerns.  He is nurse indicates him feeling discouraged that he has been in the hospital for a prolonged period of time due to lack of placement.  Slept well last night.  Eating well. Sleeping into the late morning hours this weekend.   7/15 Patient denies having any issues today.  No negative behaviors, per past staff and nursing report.  She is taking his medications.  Feels okay.  Nods yes when asked if he slept well last night.  Expresses no other concerns.  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 No pertinent surgical history. Family History: History reviewed. No pertinent family history. Family Psychiatric  History: See previous Social History:  Social History   Substance and Sexual Activity  Alcohol Use Not Currently     Social History   Substance and Sexual Activity  Drug Use Not Currently    Social History   Socioeconomic History   Marital status: Single    Spouse name: Not on file   Number of children: Not on file   Years of education: Not on file   Highest education level: Not on file  Occupational History   Not on file  Tobacco Use   Smoking status: Never     Passive exposure: Never   Smokeless tobacco: Never  Vaping Use   Vaping Use: Unknown  Substance and Sexual Activity   Alcohol use: Not Currently   Drug use: Not Currently   Sexual activity: Not Currently  Other Topics Concern   Not on file  Social History Narrative   Not on file   Social Determinants of Health   Financial Resource Strain: Not on file  Food Insecurity: Not on file  Transportation Needs: Not on file  Physical Activity: Not on file  Stress: Not on file  Social Connections: Not on file   Additional Social History:  Specify valuables returned:  (none)                      Sleep: Fair  Appetite:  Fair  Current Medications: Current Facility-Administered Medications  Medication Dose Route Frequency Provider Last Rate Last Admin   acetaminophen (TYLENOL) tablet 650 mg  650 mg Oral Q6H PRN [name], [name], MD   650 mg at 06/15/22 0817   alum & mag hydroxide-simeth (MAALOX/MYLANTA) 200-200-20 MG/5ML suspension 30 mL  30 mL Oral Q4H PRN [name], [name], MD   30 mL at 04/26/22 9179   aspirin EC tablet 81 mg  81 mg Oral Daily [name], [name], MD   81 mg at 06/16/22 0900   atorvastatin (LIPITOR) tablet 20 mg  20 mg Oral Daily [name], [name], MD   20 mg at 06/16/22 0900  atropine 1 % ophthalmic solution 1 drop  1 drop Sublingual QID [name], MD   1 drop at 06/16/22 2104   benztropine (COGENTIN) tablet 0.5 mg  0.5 mg Oral BID [name], [name], MD   0.5 mg at 06/16/22 1713   clonazePAM (KLONOPIN) tablet 1 mg  1 mg Oral TID PRN He, [name], MD   1 mg at 06/16/22 2103   cloZAPine (CLOZARIL) tablet 300 mg  300 mg Oral QHS [name], [name], MD   300 mg at 06/16/22 2104   diphenhydrAMINE (BENADRYL) capsule 50 mg  50 mg Oral Q6H PRN [name], [name], MD   50 mg at 06/06/22 1613   Or   diphenhydrAMINE (BENADRYL) injection 50 mg  50 mg Intramuscular Q6H PRN [name], [name], MD       gabapentin (NEURONTIN) capsule 300 mg  300 mg Oral TID [name], [name], MD   300 mg at  06/16/22 1713   haloperidol (HALDOL) tablet 5 mg  5 mg Oral TID [name], [name], MD   5 mg at 06/16/22 1713   haloperidol (HALDOL) tablet 5 mg  5 mg Oral Q6H PRN [name], [name], MD   5 mg at 06/15/22 2112   Or   haloperidol lactate (HALDOL) injection 5 mg  5 mg Intramuscular Q6H PRN [name], [name], MD       magnesium hydroxide (MILK OF MAGNESIA) suspension 30 mL  30 mL Oral Daily PRN [name], [name], MD   30 mL at 05/02/22 0017   metoprolol succinate (TOPROL-XL) 24 hr tablet 25 mg  25 mg Oral Daily [name], [name], MD   25 mg at 06/16/22 0900   paliperidone (INVEGA SUSTENNA) injection 156 mg  156 mg Intramuscular Q28 days [name], [name], MD   156 mg at 05/24/22 1123   temazepam (RESTORIL) capsule 15 mg  15 mg Oral QHS [name], [name], MD   15 mg at 06/15/22 2112   ziprasidone (GEODON) injection 20 mg  20 mg Intramuscular Q12H PRN [name], [name], MD   20 mg at 06/12/22 1222    Lab Results: No results found for this or any previous visit (from the past 48 hour(s)).  Blood Alcohol level:  Lab Results  Component Value Date   ETH <10 07/20/2021    Metabolic Disorder Labs: Lab Results  Component Value Date   HGBA1C 5.5 04/10/2022   MPG 111.15 04/10/2022   No results found for: "PROLACTIN" Lab Results  Component Value Date   CHOL 167 11/20/2021   TRIG 107 11/20/2021   HDL 26 (L) 11/20/2021   CHOLHDL 6.4 11/20/2021   VLDL 21 11/20/2021   LDLCALC 120 (H) 11/20/2021    Physical Findings: AIMS: Facial and Oral Movements Muscles of Facial Expression: None, normal Lips and Perioral Area: None, normal Jaw: None, normal Tongue: None, normal,Extremity Movements Upper (arms, wrists, hands, fingers): None, normal Lower (legs, knees, ankles, toes): None, normal, Trunk Movements Neck, shoulders, hips: None, normal, Overall Severity Severity of abnormal movements (highest score from questions above): None, normal Incapacitation due to abnormal movements: None, normal Patient's  awareness of abnormal movements (rate only patient's report): No Awareness, Dental Status Current problems with teeth and/or dentures?: No Does patient usually wear dentures?: No  CIWA:    COWS:     Musculoskeletal: Strength & Muscle Tone: within normal limits Gait & Station: normal Patient leans: N/A  Psychiatric Specialty Exam:  Presentation  General Appearance: hair is dishelved.  Eye Contact:Fair  Speech:Slow  Speech Volume:Decreased  Handedness:Left   Mood and Affect  Mood:good Affect:neutral  Thought Process  Thought Processes:Goal Directed  Descriptions of Associations:Circumstantial  Orientation:Partial  Thought Content:Scattered  History of Schizophrenia/Schizoaffective disorder:Yes  Duration of Psychotic Symptoms:Greater than six months  Hallucinations:No data recorded Ideas of Reference:None  Suicidal Thoughts:No data recorded Homicidal Thoughts:No data recorded  Sensorium  Memory:Remote Good  Judgment:Fair  Insight:Fair   Executive Functions  Concentration:Fair  Attention Span:Fair  Recall:Fair  Fund of Knowledge:Fair  Language:Fair   Psychomotor Activity  Psychomotor Activity:No data recorded  Assets  Assets:Housing; Resilience; Social Support; Talents/Skills   Sleep  Sleep:No data recorded   Physical Exam: Physical Exam Vitals and nursing note reviewed.  Constitutional:      Appearance: Normal appearance.  HENT:     Head: Normocephalic and atraumatic.     Mouth/Throat:     Pharynx: Oropharynx is clear.  Eyes:     Pupils: Pupils are equal, round, and reactive to light.  Cardiovascular:     Rate and Rhythm: Normal rate and regular rhythm.  Pulmonary:     Effort: Pulmonary effort is normal.     Breath sounds: Normal breath sounds.  Abdominal:     General: Abdomen is flat.     Palpations: Abdomen is soft.  Musculoskeletal:        General: Normal range of motion.  Skin:    General: Skin is warm and dry.   Neurological:     General: No focal deficit present.     Mental Status: He is alert. Mental status is at baseline.  Psychiatric:        Attention and Perception: He is inattentive.        Mood and Affect: Affect is inappropriate.        Speech: He is noncommunicative.    Review of Systems  Unable to perform ROS: Language   Blood pressure 121/76, pulse 95, temperature 98.7 F (37.1 C), temperature source Oral, resp. rate 17, height 5\' 8"  (1.727 m), weight 85.8 kg, SpO2 99 %. Body mass index is 28.76 kg/m.   Treatment Plan Summary: Medication management and Plan continue current medication.  Informed patient that unfortunately I have no news of any updates in placement but we are still working on it.  7/15 No changes  7/16 No changes  8/16, MD 06/17/2022, 8:29 AM

## 2022-06-17 NOTE — Progress Notes (Signed)
Patient alert and oriented x 4 no distress noted he is interacting appropriately with peers and staff, no distress noted, he was complaint with medication this shift. 15 minutes safety checks maintained will closely monitor. 

## 2022-06-18 NOTE — Progress Notes (Signed)
Patient alert and oriented x 4, affect is blunted thoughts are disorganized, no distress noted he is interacting appropriately with peers and staff,  he was complaint with medication this shift. Patient denies SI/HI/AVH.15 minutes safety checks maintained will closely monitor.

## 2022-06-18 NOTE — Progress Notes (Signed)
Patient is still sleeping, this writer will administer scheduled medications once he wakes up.

## 2022-06-18 NOTE — Progress Notes (Signed)
Patient agitated, yelling at peers earlier in shift. Yelling NO to staff. Patient had to be re-directed several times.Patient continues with expressive Aphasia, uses simple words and phrases at times. Denies any SI, HI, aVh. Scheduled meds given to patient. Encouragement and support provided. Safety checks maintained.  Patient took meds as directed. Remains safe on unit with q 15 min checks.

## 2022-06-18 NOTE — Plan of Care (Signed)
  Problem: Self-Concept: Goal: Level of anxiety will decrease Outcome: Not Progressing Goal: Ability to modify response to factors that promote anxiety will improve Outcome: Not Progressing   Problem: Clinical Measurements: Goal: Ability to maintain clinical measurements within normal limits will improve Outcome: Progressing

## 2022-06-18 NOTE — Progress Notes (Signed)
Patient was given his scheduled morning medication at 1158, by Maryelizabeth Kaufmann, Charity fundraiser.

## 2022-06-18 NOTE — Progress Notes (Signed)
[facility] MD Progress Note  06/18/2022 2:31 PM Adam [name]  MRN:  [id number] Subjective: Follow-up patient with schizophrenia and aphasia.  No change to clinical presentation.  Still has a little bit of agitation but no violence no threats no aggression and overall was cooperative. 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 No pertinent surgical history. Family History: History reviewed. No pertinent family history. Family Psychiatric  History: See previous Social History:  Social History   Substance and Sexual Activity  Alcohol Use Not Currently     Social History   Substance and Sexual Activity  Drug Use Not Currently    Social History   Socioeconomic History   Marital status: Single    Spouse name: Not on file   Number of children: Not on file   Years of education: Not on file   Highest education level: Not on file  Occupational History   Not on file  Tobacco Use   Smoking status: Never    Passive exposure: Never   Smokeless tobacco: Never  Vaping Use   Vaping Use: Unknown  Substance and Sexual Activity   Alcohol use: Not Currently   Drug use: Not Currently   Sexual activity: Not Currently  Other Topics Concern   Not on file  Social History Narrative   Not on file   Social Determinants of Health   Financial Resource Strain: Not on file  Food Insecurity: Not on file   Transportation Needs: Not on file  Physical Activity: Not on file  Stress: Not on file  Social Connections: Not on file   Additional Social History:  Specify valuables returned:  (none)                      Sleep: Fair  Appetite:  Fair  Current Medications: Current Facility-Administered Medications  Medication Dose Route Frequency Provider Last Rate Last Admin   acetaminophen (TYLENOL) tablet 650 mg  650 mg Oral Q6H PRN Carole Doner, [name], MD   650 mg at 06/15/22 0817   alum & mag hydroxide-simeth (MAALOX/MYLANTA) 200-200-20 MG/5ML suspension 30 mL  30 mL Oral Q4H PRN Hayde Kilgour T, MD   30 mL at 04/26/22 7416   aspirin EC tablet 81 mg  81 mg Oral Daily Taraya Steward, [name], MD   81 mg at 06/18/22 1157   atorvastatin (LIPITOR) tablet 20 mg  20 mg Oral Daily Georgia Delsignore T, MD   20 mg at 06/18/22 1158   atropine 1 % ophthalmic solution 1 drop  1 drop Sublingual QID [name], MD   1 drop at 06/18/22 1158   benztropine (COGENTIN) tablet 0.5 mg  0.5 mg Oral BID Ron Beske T, MD   0.5 mg at 06/18/22 1157   clonazePAM (KLONOPIN) tablet 1 mg  1 mg Oral TID PRN He, [name], MD   1 mg at 06/16/22 2103   cloZAPine (CLOZARIL) tablet 300 mg  300 mg Oral  QHS Jaiyla Granados, [name], MD   300 mg at 06/17/22 2137   diphenhydrAMINE (BENADRYL) capsule 50 mg  50 mg Oral Q6H PRN Jacquez Sheetz T, MD   50 mg at 06/06/22 1613   Or   diphenhydrAMINE (BENADRYL) injection 50 mg  50 mg Intramuscular Q6H PRN Marline Morace T, MD       gabapentin (NEURONTIN) capsule 300 mg  300 mg Oral TID Maryem Shuffler T, MD   300 mg at 06/18/22 1158   haloperidol (HALDOL) tablet 5 mg  5 mg Oral TID Shawnique Mariotti, [name], MD   5 mg at 06/18/22 1157   haloperidol (HALDOL) tablet 5 mg  5 mg Oral Q6H PRN Lavarius Doughten, [name], MD   5 mg at 06/15/22 2112   Or   haloperidol lactate (HALDOL) injection 5 mg  5 mg Intramuscular Q6H PRN Fayola Meckes T, MD       magnesium hydroxide (MILK OF MAGNESIA) suspension 30 mL  30 mL Oral Daily PRN Fritz Cauthon,  Ulises Wolfinger T, MD   30 mL at 05/02/22 7829   metoprolol succinate (TOPROL-XL) 24 hr tablet 25 mg  25 mg Oral Daily Xylan Sheils T, MD   25 mg at 06/18/22 1157   paliperidone (INVEGA SUSTENNA) injection 156 mg  156 mg Intramuscular Q28 days Tamu Golz T, MD   156 mg at 05/24/22 1123   temazepam (RESTORIL) capsule 15 mg  15 mg Oral QHS Duel Conrad T, MD   15 mg at 06/17/22 2137   ziprasidone (GEODON) injection 20 mg  20 mg Intramuscular Q12H PRN Dameisha Tschida, [name], MD   20 mg at 06/12/22 1222    Lab Results: No results found for this or any previous visit (from the past 48 hour(s)).  Blood Alcohol level:  Lab Results  Component Value Date   ETH <10 07/20/2021    Metabolic Disorder Labs: Lab Results  Component Value Date   HGBA1C 5.5 04/10/2022   MPG 111.15 04/10/2022   No results found for: "PROLACTIN" Lab Results  Component Value Date   CHOL 167 11/20/2021   TRIG 107 11/20/2021   HDL 26 (L) 11/20/2021   CHOLHDL 6.4 11/20/2021   VLDL 21 11/20/2021   LDLCALC 120 (H) 11/20/2021    Physical Findings: AIMS: Facial and Oral Movements Muscles of Facial Expression: None, normal Lips and Perioral Area: None, normal Jaw: None, normal Tongue: None, normal,Extremity Movements Upper (arms, wrists, hands, fingers): None, normal Lower (legs, knees, ankles, toes): None, normal, Trunk Movements Neck, shoulders, hips: None, normal, Overall Severity Severity of abnormal movements (highest score from questions above): None, normal Incapacitation due to abnormal movements: None, normal Patient's awareness of abnormal movements (rate only patient's report): No Awareness, Dental Status Current problems with teeth and/or dentures?: No Does patient usually wear dentures?: No  CIWA:    COWS:     Musculoskeletal: Strength & Muscle Tone: within normal limits Gait & Station: normal Patient leans: N/A  Psychiatric Specialty Exam:  Presentation  General Appearance: Appropriate for Environment;  Casual; Neat; Well Groomed  Eye Contact:Fair  Speech:Slow  Speech Volume:Decreased  Handedness:Left   Mood and Affect  Mood:Anxious  Affect:Congruent   Thought Process  Thought Processes:Goal Directed  Descriptions of Associations:Circumstantial  Orientation:Partial  Thought Content:Scattered  History of Schizophrenia/Schizoaffective disorder:Yes  Duration of Psychotic Symptoms:Greater than six months  Hallucinations:No data recorded Ideas of Reference:None  Suicidal Thoughts:No data recorded Homicidal Thoughts:No data recorded  Sensorium  Memory:Remote Good  Judgment:Fair  Insight:Fair   Executive Functions  Concentration:Fair  Attention Span:Fair  Recall:Fair  Fund of Knowledge:Fair  Language:Fair   Psychomotor Activity  Psychomotor Activity:No data recorded  Assets  Assets:Housing; Resilience; Social Support; Talents/Skills   Sleep  Sleep:No data recorded   Physical Exam: Physical Exam Vitals and nursing note reviewed.  Constitutional:      Appearance: Normal appearance.  HENT:     Head: Normocephalic and atraumatic.     Mouth/Throat:     Pharynx: Oropharynx is clear.  Eyes:     Pupils: Pupils are equal, round, and reactive to light.  Cardiovascular:     Rate and Rhythm: Normal rate and regular rhythm.  Pulmonary:     Effort: Pulmonary effort is normal.     Breath sounds: Normal breath sounds.  Abdominal:     General: Abdomen is flat.     Palpations: Abdomen is soft.  Musculoskeletal:        General: Normal range of motion.  Skin:    General: Skin is warm and dry.  Neurological:     General: No focal deficit present.     Mental Status: He is alert. Mental status is at baseline.  Psychiatric:        Attention and Perception: He is inattentive.        Mood and Affect: Affect is inappropriate.        Speech: He is noncommunicative.    Review of Systems  Unable to perform ROS: Language   Blood pressure 126/89, pulse  87, temperature 97.9 F (36.6 C), temperature source Oral, resp. rate 18, height 5\' 8"  (1.727 m), weight 85.8 kg, SpO2 100 %. Body mass index is 28.76 kg/m.   Treatment Plan Summary: Plan no change to medication.  Stable.  Still searching for placement  , MD 06/18/2022, 2:31 PM

## 2022-06-18 NOTE — Plan of Care (Signed)
D- Patient alert and oriented to person and situation. Patient presented in a pleasant mood on assessment stating that he slept ok last night and had no complaints to voice to this Clinical research associate. 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 Patient does say a few words, as well as repeats words he hears others say. Patient denied SI, HI, AVH, and pain to this Clinical research associate. Patient also denied any signs/symptoms of depression and anxiety. Patient had no stated goals for today.   A- Some scheduled medications administered to patient, per MD orders. Support and encouragement provided. Routine safety checks conducted every 15 minutes. Patient informed to notify staff with problems or concerns.   R- No adverse drug reactions noted. Patient contracts for safety at this time. Patient compliant with most medications and. Patient receptive, calm, and cooperative. Patient interacts well with others on the unit.  Patient remains safe at this time.  Problem: Education: Goal: Ability to state activities that reduce stress will improve Outcome: Progressing   Problem: Coping: Goal: Ability to identify and develop effective coping behavior will improve Outcome: Progressing   Problem: Self-Concept: Goal: Ability to identify factors that promote anxiety will improve Outcome: Progressing Goal: Level of anxiety will decrease Outcome: Progressing Goal: Ability to modify response to factors that promote anxiety will improve Outcome: Progressing   Problem: Education: Goal: Knowledge of General Education information will improve Description: Including pain rating scale, medication(s)/side effects and non-pharmacologic comfort measures Outcome: Progressing   Problem: Health Behavior/Discharge Planning: Goal: Ability to manage health-related needs will improve Outcome: Progressing   Problem: Clinical Measurements: Goal: Ability to maintain clinical measurements within normal limits will  improve Outcome: Progressing Goal: Will remain free from infection Outcome: Progressing Goal: Diagnostic test results will improve Outcome: Progressing Goal: Respiratory complications will improve Outcome: Progressing Goal: Cardiovascular complication will be avoided Outcome: Progressing   Problem: Activity: Goal: Risk for activity intolerance will decrease Outcome: Progressing   Problem: Nutrition: Goal: Adequate nutrition will be maintained Outcome: Progressing   Problem: Coping: Goal: Level of anxiety will decrease Outcome: Progressing   Problem: Elimination: Goal: Will not experience complications related to bowel motility Outcome: Progressing Goal: Will not experience complications related to urinary retention Outcome: Progressing   Problem: Pain Managment: Goal: General experience of comfort will improve Outcome: Progressing   Problem: Safety: Goal: Ability to remain free from injury will improve Outcome: Progressing   Problem: Skin Integrity: Goal: Risk for impaired skin integrity will decrease Outcome: Progressing

## 2022-06-18 NOTE — Progress Notes (Signed)
Recreation Therapy Notes    Date: 06/18/2022   Time: 10:05 am     Location: Court yard       Behavioral response: N/A   Intervention Topic: Communication    Discussion/Intervention: Patient refused to attend group.    Clinical Observations/Feedback:  Patient refused to attend group.    Karel Turpen LRT/CTRS          Maleak Brazzel 06/18/2022 10:22 AM

## 2022-06-19 LAB — CBC WITH DIFFERENTIAL/PLATELET
Abs Immature Granulocytes: 0.01 10*3/uL (ref 0.00–0.07)
Basophils Absolute: 0 10*3/uL (ref 0.0–0.1)
Basophils Relative: 1 %
Eosinophils Absolute: 0 10*3/uL (ref 0.0–0.5)
Eosinophils Relative: 1 %
HCT: 41.3 % (ref 39.0–52.0)
Hemoglobin: 14.1 g/dL (ref 13.0–17.0)
Immature Granulocytes: 0 %
Lymphocytes Relative: 34 %
Lymphs Abs: 1.4 10*3/uL (ref 0.7–4.0)
MCH: 29.3 pg (ref 26.0–34.0)
MCHC: 34.1 g/dL (ref 30.0–36.0)
MCV: 85.9 fL (ref 80.0–100.0)
Monocytes Absolute: 0.4 10*3/uL (ref 0.1–1.0)
Monocytes Relative: 10 %
Neutro Abs: 2.2 10*3/uL (ref 1.7–7.7)
Neutrophils Relative %: 54 %
Platelets: 158 10*3/uL (ref 150–400)
RBC: 4.81 MIL/uL (ref 4.22–5.81)
RDW: 13.2 % (ref 11.5–15.5)
WBC: 4.1 10*3/uL (ref 4.0–10.5)
nRBC: 0 % (ref 0.0–0.2)

## 2022-06-19 NOTE — Plan of Care (Signed)
D- Patient alert and oriented to person and situation. Patient presented in an irritable/labile mood on assessment, it is unclear as to what upset patient. Patient is childlike due to a past brain injury and has expressive aphasia, which makes it extremely difficult for this writer to understand what's wrong when he's like this. Patient was given PRN medication to calm him down and afterwards he became more pleasant. Patient denied SI, HI, AVH, and pain to this Clinical research associate. Patient also denied any signs/symptoms of depression and anxiety. Patient had no stated goals for today.   A- Some scheduled medications administered to patient, per MD orders. Support and encouragement provided. Routine safety checks conducted every 15 minutes. Patient informed to notify staff with problems or concerns.   R- No adverse drug reactions noted. Patient contracts for safety at this time. Patient compliant with most medications and. Patient receptive, calm, and cooperative. Patient interacts well with others on the unit.  Patient remains safe at this time.  Problem: Education: Goal: Ability to state activities that reduce stress will improve Outcome: Not Progressing   Problem: Coping: Goal: Ability to identify and develop effective coping behavior will improve Outcome: Not Progressing   Problem: Self-Concept: Goal: Ability to identify factors that promote anxiety will improve Outcome: Not Progressing Goal: Level of anxiety will decrease Outcome: Not Progressing Goal: Ability to modify response to factors that promote anxiety will improve Outcome: Not Progressing   Problem: Education: Goal: Knowledge of General Education information will improve Description: Including pain rating scale, medication(s)/side effects and non-pharmacologic comfort measures Outcome: Not Progressing   Problem: Health Behavior/Discharge Planning: Goal: Ability to manage health-related needs will improve Outcome: Not Progressing    Problem: Clinical Measurements: Goal: Ability to maintain clinical measurements within normal limits will improve Outcome: Not Progressing Goal: Will remain free from infection Outcome: Not Progressing Goal: Diagnostic test results will improve Outcome: Not Progressing Goal: Respiratory complications will improve Outcome: Not Progressing Goal: Cardiovascular complication will be avoided Outcome: Not Progressing   Problem: Activity: Goal: Risk for activity intolerance will decrease Outcome: Not Progressing   Problem: Nutrition: Goal: Adequate nutrition will be maintained Outcome: Not Progressing   Problem: Coping: Goal: Level of anxiety will decrease Outcome: Not Progressing   Problem: Elimination: Goal: Will not experience complications related to bowel motility Outcome: Not Progressing Goal: Will not experience complications related to urinary retention Outcome: Not Progressing   Problem: Pain Managment: Goal: General experience of comfort will improve Outcome: Not Progressing   Problem: Safety: Goal: Ability to remain free from injury will improve Outcome: Not Progressing   Problem: Skin Integrity: Goal: Risk for impaired skin integrity will decrease Outcome: Not Progressing

## 2022-06-19 NOTE — Progress Notes (Signed)
Recreation Therapy Notes  Date: 06/19/2022  Time: 10:20 am    Location: Courtyard       Behavioral response: N/A   Intervention Topic: Leisure    Discussion/Intervention: Patient refused to attend group.   Clinical Observations/Feedback:  Patient refused to attend group.    Ranee Peasley LRT/CTRS          Janeisha Ryle 06/19/2022 11:56 AM

## 2022-06-19 NOTE — Progress Notes (Signed)
Patient is upset, yelling incoherently. Patient walked back to his room and is banging on the doors. Patient is not responding to verbal de-escalation, so PRN medication will be administered.

## 2022-06-19 NOTE — Progress Notes (Signed)
[facility] MD Progress Note  06/19/2022 10:43 AM Adam [name]  MRN:  [id number] Subjective: Follow-up for this patient with chronic impairment as noted previously.  He was agitated today evidently because his iPad had been taken back by the speech service.  I explained to the patient that it was only on loan and that he was not using it for the language purposes for which it had previously been intended and they needed to use it for other patients.  Patient was angry and shouting but was not physically aggressive and ultimately was redirectable back to his room. 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 No pertinent surgical history. Family History: History reviewed. No pertinent family history. Family Psychiatric  History: See previous Social History:  Social History   Substance and Sexual Activity  Alcohol Use Not Currently     Social History   Substance and Sexual Activity  Drug Use Not Currently    Social History   Socioeconomic History   Marital status: Single    Spouse name: Not on file   Number of children: Not on file   Years of education: Not on file   Highest education level: Not on file  Occupational History   Not on file  Tobacco Use   Smoking status: Never    Passive exposure: Never   Smokeless tobacco: Never   Vaping Use   Vaping Use: Unknown  Substance and Sexual Activity   Alcohol use: Not Currently   Drug use: Not Currently   Sexual activity: Not Currently  Other Topics Concern   Not on file  Social History Narrative   Not on file   Social Determinants of Health   Financial Resource Strain: Not on file  Food Insecurity: Not on file  Transportation Needs: Not on file  Physical Activity: Not on file  Stress: Not on file  Social Connections: Not on file   Additional Social History:  Specify valuables returned:  (none)                      Sleep: Fair  Appetite:  Negative  Current Medications: Current Facility-Administered Medications  Medication Dose Route Frequency Provider Last Rate Last Admin   acetaminophen (TYLENOL) tablet 650 mg  650 mg Oral Q6H PRN Montez Cuda, [name], MD   650 mg at 06/15/22 0817   alum & mag hydroxide-simeth (MAALOX/MYLANTA) 200-200-20 MG/5ML suspension 30 mL  30 mL Oral Q4H PRN Philicia Heyne T, MD   30 mL at 04/26/22 7106   aspirin EC tablet 81 mg  81 mg Oral Daily Ciani Rutten, [name], MD   81 mg at 06/19/22 0906   atorvastatin (LIPITOR) tablet 20 mg  20 mg Oral Daily Kiyani Jernigan, [name], MD   20 mg at 06/19/22 0905   atropine 1 % ophthalmic solution 1 drop  1 drop Sublingual QID [name], MD  1 drop at 06/19/22 0906   benztropine (COGENTIN) tablet 0.5 mg  0.5 mg Oral BID Paolo Okane T, MD   0.5 mg at 06/19/22 2440   clonazePAM (KLONOPIN) tablet 1 mg  1 mg Oral TID PRN He, [name], MD   1 mg at 06/16/22 2103   cloZAPine (CLOZARIL) tablet 300 mg  300 mg Oral QHS Joella Saefong T, MD   300 mg at 06/18/22 2051   diphenhydrAMINE (BENADRYL) capsule 50 mg  50 mg Oral Q6H PRN Shamell Suarez T, MD   50 mg at 06/06/22 1613   Or   diphenhydrAMINE (BENADRYL) injection 50 mg  50 mg Intramuscular Q6H PRN Mackinzie Vuncannon T, MD       gabapentin (NEURONTIN) capsule 300 mg  300 mg Oral TID Malachai Schalk, [name], MD   300 mg at 06/19/22 1027   haloperidol (HALDOL) tablet 5 mg  5  mg Oral TID Sunset Joshi, [name], MD   5 mg at 06/19/22 2536   haloperidol (HALDOL) tablet 5 mg  5 mg Oral Q6H PRN Arish Redner, [name], MD   5 mg at 06/15/22 2112   Or   haloperidol lactate (HALDOL) injection 5 mg  5 mg Intramuscular Q6H PRN Cledith Abdou T, MD       magnesium hydroxide (MILK OF MAGNESIA) suspension 30 mL  30 mL Oral Daily PRN Treyana Sturgell T, MD   30 mL at 05/02/22 6440   metoprolol succinate (TOPROL-XL) 24 hr tablet 25 mg  25 mg Oral Daily Ricky Gallery T, MD   25 mg at 06/18/22 1157   paliperidone (INVEGA SUSTENNA) injection 156 mg  156 mg Intramuscular Q28 days Eevee Borbon T, MD   156 mg at 05/24/22 1123   temazepam (RESTORIL) capsule 15 mg  15 mg Oral QHS Anali Cabanilla T, MD   15 mg at 06/18/22 2051   ziprasidone (GEODON) injection 20 mg  20 mg Intramuscular Q12H PRN Yoltzin Barg, [name], MD   20 mg at 06/19/22 1000    Lab Results: No results found for this or any previous visit (from the past 48 hour(s)).  Blood Alcohol level:  Lab Results  Component Value Date   ETH <10 07/20/2021    Metabolic Disorder Labs: Lab Results  Component Value Date   HGBA1C 5.5 04/10/2022   MPG 111.15 04/10/2022   No results found for: "PROLACTIN" Lab Results  Component Value Date   CHOL 167 11/20/2021   TRIG 107 11/20/2021   HDL 26 (L) 11/20/2021   CHOLHDL 6.4 11/20/2021   VLDL 21 11/20/2021   LDLCALC 120 (H) 11/20/2021    Physical Findings: AIMS: Facial and Oral Movements Muscles of Facial Expression: None, normal Lips and Perioral Area: None, normal Jaw: None, normal Tongue: None, normal,Extremity Movements Upper (arms, wrists, hands, fingers): None, normal Lower (legs, knees, ankles, toes): None, normal, Trunk Movements Neck, shoulders, hips: None, normal, Overall Severity Severity of abnormal movements (highest score from questions above): None, normal Incapacitation due to abnormal movements: None, normal Patient's awareness of abnormal movements (rate only patient's report):  No Awareness, Dental Status Current problems with teeth and/or dentures?: No Does patient usually wear dentures?: No  CIWA:    COWS:     Musculoskeletal: Strength & Muscle Tone: within normal limits Gait & Station: normal Patient leans: N/A  Psychiatric Specialty Exam:  Presentation  General Appearance: Appropriate for Environment; Casual; Neat; Well Groomed  Eye Contact:Fair  Speech:Slow  Speech Volume:Decreased  Handedness:Left   Mood and Affect  Mood:Anxious  Affect:Congruent   Thought Process  Thought Processes:Goal Directed  Descriptions of Associations:Circumstantial  Orientation:Partial  Thought Content:Scattered  History of Schizophrenia/Schizoaffective disorder:Yes  Duration of Psychotic Symptoms:Greater than six months  Hallucinations:No data recorded Ideas of Reference:None  Suicidal Thoughts:No data recorded Homicidal Thoughts:No data recorded  Sensorium  Memory:Remote Good  Judgment:Fair  Insight:Fair   Executive Functions  Concentration:Fair  Attention Span:Fair  Recall:Fair  Fund of Knowledge:Fair  Language:Fair   Psychomotor Activity  Psychomotor Activity:No data recorded  Assets  Assets:Housing; Resilience; Social Support; Talents/Skills   Sleep  Sleep:No data recorded   Physical Exam: Physical Exam Vitals and nursing note reviewed.  Constitutional:      Appearance: Normal appearance.  HENT:     Head: Normocephalic and atraumatic.     Mouth/Throat:     Pharynx: Oropharynx is clear.  Eyes:     Pupils: Pupils are equal, round, and reactive to light.  Cardiovascular:     Rate and Rhythm: Normal rate and regular rhythm.  Pulmonary:     Effort: Pulmonary effort is normal.     Breath sounds: Normal breath sounds.  Abdominal:     General: Abdomen is flat.     Palpations: Abdomen is soft.  Musculoskeletal:        General: Normal range of motion.  Skin:    General: Skin is warm and dry.  Neurological:      General: No focal deficit present.     Mental Status: He is alert. Mental status is at baseline.  Psychiatric:        Attention and Perception: He is inattentive.        Mood and Affect: Affect is inappropriate.        Speech: He is noncommunicative.    Review of Systems  Unable to perform ROS: Language  Neurological: Negative.    Blood pressure (!) 58/47, pulse (!) 101, temperature 97.9 F (36.6 C), temperature source Oral, resp. rate 18, height 5\' 8"  (1.727 m), weight 85.8 kg, SpO2 100 %. Body mass index is 28.76 kg/m.   Treatment Plan Summary: Medication management and Plan no change to medication.  Continues to appear to do well on the Clozapine.  Most recent blood tests were normal.  Probably due for another one coming up.  Appreciate assistance from pharmacy.  Multiple PRNs are available for moments of agitation like this.  I will emphasize that he was not physically aggressive today and was redirectable after shouting a little bit and has calm down.  Still waiting on placement.  , MD 06/19/2022, 10:43 AM

## 2022-06-19 NOTE — Progress Notes (Signed)
Patient allowed Lab to draw his blood work.

## 2022-06-19 NOTE — Progress Notes (Signed)
BP was low at 58/47. Patient refused to let this Clinical research associate re-check it. This Clinical research associate will follow up with patient in a few and notify MD. Patient also given a Gatorade to help bring his pressure up. Will continue to monitor. Patient remains safe on the unit.

## 2022-06-19 NOTE — Progress Notes (Signed)
PHARMACIST - PHYSICIAN ORDER COMMUNICATION  Adam [name] is a 61 y.o. year old male with a history of schizophrenia on Clozapine PTA. Continuing this medication order as an inpatient requires that monitoring parameters per REMS requirements must be met.   Clozapine REMS Dispense Authorization was obtained, and will dispense inpatient.  RDA code [id number].  Verified Clozapine dose: 300 mg po HS  Last ANC value and date reported on the Clozapine REMS website: 2200/L [place] monitoring frequency: once weekly Next [place] reporting is due on (date) 06/26/22.  [name] 06/19/2022, 2:09 PM

## 2022-06-19 NOTE — Progress Notes (Signed)
Patient was allowing this writer to obtain a set of vitals, but then he got a little agitated and walked off. This Clinical research associate will let patient calm down and attempt it again.

## 2022-06-20 NOTE — Progress Notes (Signed)
Recreation Therapy Notes  Date: 06/20/2022  Time: 10:45 am    Location: Craft room   Behavioral response: N/A   Intervention Topic: Life Planning   Discussion/Intervention: Patient refused to attend group.   Clinical Observations/Feedback:  Patient refused to attend group.    Nikhil Osei LRT/CTRS         Taneika Choi 06/20/2022 12:05 PM

## 2022-06-20 NOTE — Progress Notes (Signed)
At the nurses station asking for tape for some of the pictures that he has colored today.  He presents in a pleasant mood. Denies si/hi/avh anxiety and pain. He does endorse depression and rates it 3/10 at his encounter. Will continue to monitor and offer support and encouragement.    C Butler-Nicholson, LPN

## 2022-06-20 NOTE — Plan of Care (Signed)
  Problem: Education: Goal: Ability to state activities that reduce stress will improve Outcome: Progressing   Problem: Coping: Goal: Ability to identify and develop effective coping behavior will improve Outcome: Progressing   Problem: Self-Concept: Goal: Ability to identify factors that promote anxiety will improve Outcome: Progressing Goal: Level of anxiety will decrease Outcome: Progressing Goal: Ability to modify response to factors that promote anxiety will improve Outcome: Progressing   Problem: Education: Goal: Knowledge of General Education information will improve Description: Including pain rating scale, medication(s)/side effects and non-pharmacologic comfort measures Outcome: Progressing   Problem: Skin Integrity: Goal: Risk for impaired skin integrity will decrease Outcome: Progressing   Problem: Safety: Goal: Ability to remain free from injury will improve Outcome: Progressing   Problem: Pain Managment: Goal: General experience of comfort will improve Outcome: Progressing

## 2022-06-20 NOTE — Progress Notes (Signed)
Patient received his QHS medication without incident.      C Butler-Nicholson, LPN

## 2022-06-20 NOTE — Progress Notes (Signed)
Patient has had labile mood since start of shift. Still irritated that his tablet has been taken, he keeps gesturing for the tablet. Started yelling out when told no. Was not initially accepting that he can no longer have the tablet. Staff explained to [facility] that the tablet belonged to the speech and language staff, and they needed their tablet back.  Staff also advised that everything he was looking at on the tablet he can look at using his phone.  Otherwise doing well. Ate all of his snack.  Med compliant with no other issues or concerns.  He denies si/hi/avh and anxiety.  He does endorse depression and pain.  Will continue  to monitor with q15 minute safety checks.      C Butler-Nicholson, LPN

## 2022-06-20 NOTE — [facility] Group Notes (Signed)
[facility] Group Notes:  (Nursing/MHT/Case Management/Adjunct)  Date:  06/20/2022  Time:  9:37 AM  Type of Therapy:   community meeting  Participation Level:  Did Not Attend    Lynelle Smoke [facility] 06/20/2022, 9:37 AM

## 2022-06-20 NOTE — Plan of Care (Signed)
Patient stayed in his room with his phone and singing loud. No irritable behaviors noted.Patient came to nurses station asking for Tylenol for headache. No pain verbalized later.Compliant with medications. Patient stayed in courtyard with group. Support and encouragement given.

## 2022-06-20 NOTE — Progress Notes (Signed)
[facility] MD Progress Note  06/20/2022 2:28 PM Bhavesh Vazquez  MRN:  [id number] Subjective: Patient seen and chart reviewed.  Follow-up patient with aphasia and schizophrenia.  No clear specific new complaints or problems today.  Behavior is about the same as usual. 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 No pertinent surgical history. Family History: History reviewed. No pertinent family history. Family Psychiatric  History: See previous Social History:  Social History   Substance and Sexual Activity  Alcohol Use Not Currently     Social History   Substance and Sexual Activity  Drug Use Not Currently    Social History   Socioeconomic History   Marital status: Single    Spouse name: Not on file   Number of children: Not on file   Years of education: Not on file   Highest education level: Not on file  Occupational History   Not on file  Tobacco Use   Smoking status: Never    Passive exposure: Never   Smokeless tobacco: Never  Vaping Use   Vaping Use: Unknown  Substance and Sexual Activity   Alcohol use: Not Currently   Drug use: Not Currently   Sexual activity: Not Currently  Other Topics Concern   Not on file  Social History Narrative   Not on file   Social Determinants of Health   Financial Resource Strain: Not on file  Food  Insecurity: Not on file  Transportation Needs: Not on file  Physical Activity: Not on file  Stress: Not on file  Social Connections: Not on file   Additional Social History:  Specify valuables returned:  (none)                      Sleep: Fair  Appetite:  Fair  Current Medications: Current Facility-Administered Medications  Medication Dose Route Frequency Provider Last Rate Last Admin   acetaminophen (TYLENOL) tablet 650 mg  650 mg Oral Q6H PRN Akiva Josey T, MD   650 mg at 06/20/22 1351   alum & mag hydroxide-simeth (MAALOX/MYLANTA) 200-200-20 MG/5ML suspension 30 mL  30 mL Oral Q4H PRN Pearse Shiffler T, MD   30 mL at 04/26/22 5277   aspirin EC tablet 81 mg  81 mg Oral Daily Mixtli Reno, [name], MD   81 mg at 06/20/22 0901   atorvastatin (LIPITOR) tablet 20 mg  20 mg Oral Daily Rylyn Ranganathan T, MD   20 mg at 06/20/22 0911   atropine 1 % ophthalmic solution 1 drop  1 drop Sublingual QID [name], MD   1 drop at 06/20/22 1203   benztropine (COGENTIN) tablet 0.5 mg  0.5 mg Oral BID Arleen Bar T, MD   0.5 mg at 06/20/22 0911   clonazePAM (KLONOPIN) tablet 1 mg  1 mg Oral TID PRN He, [name], MD   1 mg at 06/16/22 2103   cloZAPine (CLOZARIL) tablet 300 mg  300 mg Oral QHS Faraaz Wolin, [name], MD   300 mg at 06/19/22 2038   diphenhydrAMINE (BENADRYL) capsule 50 mg  50 mg Oral Q6H PRN Ahmiyah Coil, [name], MD   50 mg at 06/06/22 1613   Or   diphenhydrAMINE (BENADRYL) injection 50 mg  50 mg Intramuscular Q6H PRN Marelly Wehrman, [name], MD       gabapentin (NEURONTIN) capsule 300 mg  300 mg Oral TID Raynard Mapps, [name], MD   300 mg at 06/20/22 1202   haloperidol (HALDOL) tablet 5 mg  5 mg Oral TID Yael Angerer, [name], MD   5 mg at 06/20/22 1201   haloperidol (HALDOL) tablet 5 mg  5 mg Oral Q6H PRN Janisha Bueso, [name], MD   5 mg at 06/15/22 2112   Or   haloperidol lactate (HALDOL) injection 5 mg  5 mg Intramuscular Q6H PRN Layloni Fahrner, [name], MD       magnesium hydroxide (MILK OF MAGNESIA) suspension 30 mL  30 mL  Oral Daily PRN Zalika Tieszen T, MD   30 mL at 05/02/22 8786   metoprolol succinate (TOPROL-XL) 24 hr tablet 25 mg  25 mg Oral Daily Hiedi Touchton T, MD   25 mg at 06/20/22 0912   paliperidone (INVEGA SUSTENNA) injection 156 mg  156 mg Intramuscular Q28 days Emett Stapel T, MD   156 mg at 05/24/22 1123   temazepam (RESTORIL) capsule 15 mg  15 mg Oral QHS Margaretha Mahan T, MD   15 mg at 06/19/22 2038   ziprasidone (GEODON) injection 20 mg  20 mg Intramuscular Q12H PRN Rithvik Orcutt, [name], MD   20 mg at 06/19/22 1000    Lab Results:  Results for orders placed or performed during the hospital encounter of 03/19/22 (from the past 48 hour(s))  CBC with Differential/Platelet     Status: None   Collection Time: 06/19/22  1:49 PM  Result Value Ref Range   WBC 4.1 4.0 - 10.5 K/uL   RBC 4.81 4.22 - 5.81 MIL/uL   Hemoglobin 14.1 13.0 - 17.0 g/dL   HCT 76.7 20.9 - 47.0 %   MCV 85.9 80.0 - 100.0 fL   MCH 29.3 26.0 - 34.0 pg   MCHC 34.1 30.0 - 36.0 g/dL   RDW 96.2 83.6 - 62.9 %   Platelets 158 150 - 400 K/uL   nRBC 0.0 0.0 - 0.2 %   Neutrophils Relative % 54 %   Neutro Abs 2.2 1.7 - 7.7 K/uL   Lymphocytes Relative 34 %   Lymphs Abs 1.4 0.7 - 4.0 K/uL   Monocytes Relative 10 %   Monocytes Absolute 0.4 0.1 - 1.0 K/uL   Eosinophils Relative 1 %   Eosinophils Absolute 0.0 0.0 - 0.5 K/uL   Basophils Relative 1 %   Basophils Absolute 0.0 0.0 - 0.1 K/uL   Immature Granulocytes 0 %   Abs Immature Granulocytes 0.01 0.00 - 0.07 K/uL    Comment: Performed at [facility], [address] [place], [place] [address]    Blood Alcohol level:  Lab Results  Component Value Date   [facility] <10 07/20/2021    Metabolic Disorder Labs: Lab Results  Component Value Date   HGBA1C 5.5 04/10/2022   MPG 111.15 04/10/2022   No results found for: "PROLACTIN" Lab Results  Component Value Date   CHOL 167 11/20/2021   TRIG 107 11/20/2021   HDL 26 (L) 11/20/2021   CHOLHDL 6.4 11/20/2021   VLDL 21 11/20/2021  LDLCALC 120 (H) 11/20/2021    Physical Findings: AIMS: Facial and Oral Movements Muscles of Facial Expression: None, normal Lips and Perioral Area: None, normal Jaw: None, normal Tongue: None, normal,Extremity Movements Upper (arms, wrists, hands, fingers): None, normal Lower (legs, knees, ankles, toes): None, normal, Trunk Movements Neck, shoulders, hips: None, normal, Overall Severity Severity of abnormal movements (highest score from questions above): None, normal Incapacitation due to abnormal movements: None, normal Patient's awareness of abnormal movements (rate only patient's report): No Awareness, Dental Status Current problems with teeth and/or dentures?: No Does patient usually wear dentures?: No  CIWA:    COWS:     Musculoskeletal: Strength & Muscle Tone: within normal limits Gait & Station: normal Patient leans: N/A  Psychiatric Specialty Exam:  Presentation  General Appearance: Appropriate for Environment; Casual; Neat; Well Groomed  Eye Contact:Fair  Speech:Slow  Speech Volume:Decreased  Handedness:Left   Mood and Affect  Mood:Anxious  Affect:Congruent   Thought Process  Thought Processes:Goal Directed  Descriptions of Associations:Circumstantial  Orientation:Partial  Thought Content:Scattered  History of Schizophrenia/Schizoaffective disorder:Yes  Duration of Psychotic Symptoms:Greater than six months  Hallucinations:No data recorded Ideas of Reference:None  Suicidal Thoughts:No data recorded Homicidal Thoughts:No data recorded  Sensorium  Memory:Remote Good  Judgment:Fair  Insight:Fair   Executive Functions  Concentration:Fair  Attention Span:Fair  Recall:Fair  Fund of Knowledge:Fair  Language:Fair   Psychomotor Activity  Psychomotor Activity:No data recorded  Assets  Assets:Housing; Resilience; Social Support; Talents/Skills   Sleep  Sleep:No data recorded   Physical Exam: Physical Exam Vitals and  nursing note reviewed.  Constitutional:      Appearance: Normal appearance.  HENT:     Head: Normocephalic and atraumatic.     Mouth/Throat:     Pharynx: Oropharynx is clear.  Eyes:     Pupils: Pupils are equal, round, and reactive to light.  Cardiovascular:     Rate and Rhythm: Normal rate and regular rhythm.  Pulmonary:     Effort: Pulmonary effort is normal.     Breath sounds: Normal breath sounds.  Abdominal:     General: Abdomen is flat.     Palpations: Abdomen is soft.  Musculoskeletal:        General: Normal range of motion.  Skin:    General: Skin is warm and dry.  Neurological:     General: No focal deficit present.     Mental Status: He is alert. Mental status is at baseline.  Psychiatric:        Attention and Perception: He is inattentive.        Mood and Affect: Mood normal. Affect is inappropriate.        Speech: He is noncommunicative.    Review of Systems  Unable to perform ROS: Language  Neurological: Negative.    Blood pressure (!) 141/107, pulse (!) 111, temperature 98.5 F (36.9 C), temperature source Oral, resp. rate 17, height 5\' 8"  (1.727 m), weight 85.8 kg, SpO2 100 %. Body mass index is 28.76 kg/m.   Treatment Plan Summary: Medication management and Plan no change to medication management.  Stable on clozapine dose.  Behavior waxes and wanes but for the most part is much much better than previously.  It was brought to my attention today that he was running a higher blood pressure than usual.  I will review what he has been having as a blood pressure recently and we will consider whether any medicine needs to be added.  , MD 06/20/2022, 2:28 PM

## 2022-06-20 NOTE — Plan of Care (Signed)
  Problem: Education: Goal: Ability to state activities that reduce stress will improve Outcome: Progressing   Problem: Coping: Goal: Ability to identify and develop effective coping behavior will improve Outcome: Progressing   Problem: Self-Concept: Goal: Ability to identify factors that promote anxiety will improve Outcome: Progressing Goal: Level of anxiety will decrease Outcome: Progressing Goal: Ability to modify response to factors that promote anxiety will improve Outcome: Progressing   Problem: Education: Goal: Knowledge of General Education information will improve Description: Including pain rating scale, medication(s)/side effects and non-pharmacologic comfort measures Outcome: Progressing   Problem: Skin Integrity: Goal: Risk for impaired skin integrity will decrease Outcome: Progressing   Problem: Safety: Goal: Ability to remain free from injury will improve Outcome: Progressing   Problem: Pain Managment: Goal: General experience of comfort will improve Outcome: Progressing   Problem: Coping: Goal: Level of anxiety will decrease Outcome: Progressing   Problem: Nutrition: Goal: Adequate nutrition will be maintained Outcome: Progressing   Problem: Activity: Goal: Risk for activity intolerance will decrease Outcome: Progressing   Problem: Clinical Measurements: Goal: Ability to maintain clinical measurements within normal limits will improve Outcome: Progressing Goal: Will remain free from infection Outcome: Progressing Goal: Diagnostic test results will improve Outcome: Progressing Goal: Respiratory complications will improve Outcome: Progressing Goal: Cardiovascular complication will be avoided Outcome: Progressing   Problem: Health Behavior/Discharge Planning: Goal: Ability to manage health-related needs will improve Outcome: Progressing

## 2022-06-20 NOTE — Progress Notes (Signed)
RN Chloe notified about pt high BP   154/105 141/107  Different arms, sitting position    Lw edt

## 2022-06-21 NOTE — Progress Notes (Signed)
Patient came up to the nurses station holding shaving cream. This writer went down to help patient shave. Patient tolerated shaving his face well, without any issues. Patient remains safe on the unit at this time.

## 2022-06-21 NOTE — [facility] Group Notes (Signed)
[facility] Group Notes:  (Nursing/MHT/Case Management/Adjunct)  Date:  06/21/2022  Time:  10:13 AM  Type of Therapy:   Community Meeting  Participation Level:  Did Not Attend  Lynelle Smoke [facility] 06/21/2022, 10:13 AM

## 2022-06-21 NOTE — Progress Notes (Signed)
[name] Progress Note  06/21/2022 12:36 PM Adam [name]  MRN:  [id number] Subjective: Follow-up 61 year old man with schizophrenia and aphasia.  Patient was calm and awake lucid interacted as appropriately as as possible.  Still cannot carry on a conversation but gives the impression of understanding much of what is said to him.  No expressions of aggression. 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 No pertinent surgical history. Family History: History reviewed. No pertinent family history. Family Psychiatric  History: See previous Social History:  Social History   Substance and Sexual Activity  Alcohol Use Not Currently     Social History   Substance and Sexual Activity  Drug Use Not Currently    Social History   Socioeconomic History   Marital status: Single    Spouse name: Not on file   Number of children: Not on file   Years of education: Not on file   Highest education level: Not on file  Occupational History   Not on file  Tobacco Use   Smoking status: Never    Passive exposure: Never   Smokeless tobacco: Never  Vaping Use   Vaping Use: Unknown  Substance and Sexual Activity   Alcohol use: Not Currently   Drug use: Not Currently   Sexual activity: Not Currently  Other Topics Concern   Not on file  Social History Narrative    Not on file   Social Determinants of Health   Financial Resource Strain: Not on file  Food Insecurity: Not on file  Transportation Needs: Not on file  Physical Activity: Not on file  Stress: Not on file  Social Connections: Not on file   Additional Social History:  Specify valuables returned:  (none)                      Sleep: Good  Appetite:  Good  Current Medications: Current Facility-Administered Medications  Medication Dose Route Frequency Provider Last Rate Last Admin   acetaminophen (TYLENOL) tablet 650 mg  650 mg Oral Q6H PRN [name] T, MD   650 mg at 06/20/22 1351   alum & mag hydroxide-simeth (MAALOX/MYLANTA) 200-200-20 MG/5ML suspension 30 mL  30 mL Oral Q4H PRN [name] T, MD   30 mL at 04/26/22 6270   aspirin EC tablet 81 mg  81 mg Oral Daily [name], [name], MD   81 mg at 06/21/22 0914   atorvastatin (LIPITOR) tablet 20 mg  20 mg Oral Daily [name] T, MD   20 mg at 06/21/22 0914   atropine 1 % ophthalmic solution 1 drop  1 drop Sublingual QID [name], MD   1 drop at 06/21/22 1214   benztropine (COGENTIN) tablet 0.5 mg  0.5 mg Oral BID [name] T, MD   0.5 mg at 06/21/22 0914   clonazePAM (KLONOPIN) tablet 1 mg  1 mg Oral TID PRN He,  [name], MD   1 mg at 06/16/22 2103   cloZAPine (CLOZARIL) tablet 300 mg  300 mg Oral QHS [name] T, MD   300 mg at 06/20/22 2109   diphenhydrAMINE (BENADRYL) capsule 50 mg  50 mg Oral Q6H PRN [name], [name], MD   50 mg at 06/06/22 1613   Or   diphenhydrAMINE (BENADRYL) injection 50 mg  50 mg Intramuscular Q6H PRN Annabeth Tortora, [name], MD       gabapentin (NEURONTIN) capsule 300 mg  300 mg Oral TID Lilyan Prete, [name], MD   300 mg at 06/21/22 1213   haloperidol (HALDOL) tablet 5 mg  5 mg Oral TID [name], [name], MD   5 mg at 06/21/22 1213   haloperidol (HALDOL) tablet 5 mg  5 mg Oral Q6H PRN [name], [name], MD   5 mg at 06/15/22 2112   Or   haloperidol lactate (HALDOL) injection 5 mg  5 mg Intramuscular  Q6H PRN [name], [name], MD       magnesium hydroxide (MILK OF MAGNESIA) suspension 30 mL  30 mL Oral Daily PRN [name] T, MD   30 mL at 05/02/22 [id number]   metoprolol succinate (TOPROL-XL) 24 hr tablet 25 mg  25 mg Oral Daily [name] T, MD   25 mg at 06/21/22 0914   paliperidone (INVEGA SUSTENNA) injection 156 mg  156 mg Intramuscular Q28 days [name] T, MD   156 mg at 06/21/22 0917   temazepam (RESTORIL) capsule 15 mg  15 mg Oral QHS [name] T, MD   15 mg at 06/20/22 2109   ziprasidone (GEODON) injection 20 mg  20 mg Intramuscular Q12H PRN [name], [name], MD   20 mg at 06/19/22 1000    Lab Results:  Results for orders placed or performed during the hospital encounter of 03/19/22 (from the past 48 hour(s))  CBC with Differential/Platelet     Status: None   Collection Time: 06/19/22  1:49 PM  Result Value Ref Range   WBC 4.1 4.0 - 10.5 K/uL   RBC 4.81 4.22 - 5.81 MIL/uL   Hemoglobin 14.1 13.0 - 17.0 g/dL   HCT 41.3 39.0 - 52.0 %   MCV 85.9 80.0 - 100.0 fL   MCH 29.3 26.0 - 34.0 pg   MCHC 34.1 30.0 - 36.0 g/dL   RDW 13.2 11.5 - 15.5 %   Platelets 158 150 - 400 K/uL   nRBC 0.0 0.0 - 0.2 %   Neutrophils Relative % 54 %   Neutro Abs 2.2 1.7 - 7.7 K/uL   Lymphocytes Relative 34 %   Lymphs Abs 1.4 0.7 - 4.0 K/uL   Monocytes Relative 10 %   Monocytes Absolute 0.4 0.1 - 1.0 K/uL   Eosinophils Relative 1 %   Eosinophils Absolute 0.0 0.0 - 0.5 K/uL   Basophils Relative 1 %   Basophils Absolute 0.0 0.0 - 0.1 K/uL   Immature Granulocytes 0 %   Abs Immature Granulocytes 0.01 0.00 - 0.07 K/uL    Comment: Performed at [facility], [address] [place], [place] [address]    Blood Alcohol level:  Lab Results  Component Value Date   [facility] <10 [id number]    Metabolic Disorder Labs: Lab Results  Component Value Date   HGBA1C 5.5 04/10/2022   MPG 111.15 04/10/2022   No results found for: "PROLACTIN" Lab Results  Component Value Date   CHOL 167  11/20/2021   TRIG 107 11/20/2021  HDL 26 (L) 11/20/2021   CHOLHDL 6.4 11/20/2021   VLDL 21 11/20/2021   LDLCALC 120 (H) 11/20/2021    Physical Findings: AIMS: Facial and Oral Movements Muscles of Facial Expression: None, normal Lips and Perioral Area: None, normal Jaw: None, normal Tongue: None, normal,Extremity Movements Upper (arms, wrists, hands, fingers): None, normal Lower (legs, knees, ankles, toes): None, normal, Trunk Movements Neck, shoulders, hips: None, normal, Overall Severity Severity of abnormal movements (highest score from questions above): None, normal Incapacitation due to abnormal movements: None, normal Patient's awareness of abnormal movements (rate only patient's report): No Awareness, Dental Status Current problems with teeth and/or dentures?: No Does patient usually wear dentures?: No  CIWA:    COWS:     Musculoskeletal: Strength & Muscle Tone: within normal limits Gait & Station: normal Patient leans: N/A  Psychiatric Specialty Exam:  Presentation  General Appearance: Appropriate for Environment; Casual; Neat; Well Groomed  Eye Contact:Fair  Speech:Slow  Speech Volume:Decreased  Handedness:Left   Mood and Affect  Mood:Anxious  Affect:Congruent   Thought Process  Thought Processes:Goal Directed  Descriptions of Associations:Circumstantial  Orientation:Partial  Thought Content:Scattered  History of Schizophrenia/Schizoaffective disorder:Yes  Duration of Psychotic Symptoms:Greater than six months  Hallucinations:No data recorded Ideas of Reference:None  Suicidal Thoughts:No data recorded Homicidal Thoughts:No data recorded  Sensorium  Memory:Remote Good  Judgment:Fair  Insight:Fair   Executive Functions  Concentration:Fair  Attention Span:Fair  Recall:Fair  Fund of Knowledge:Fair  Language:Fair   Psychomotor Activity  Psychomotor Activity:No data recorded  Assets  Assets:Housing; Resilience; Social  Support; Talents/Skills   Sleep  Sleep:No data recorded   Physical Exam: Physical Exam Vitals and nursing note reviewed.  Constitutional:      Appearance: Normal appearance.  HENT:     Head: Normocephalic and atraumatic.     Mouth/Throat:     Pharynx: Oropharynx is clear.  Eyes:     Pupils: Pupils are equal, round, and reactive to light.  Cardiovascular:     Rate and Rhythm: Normal rate and regular rhythm.  Pulmonary:     Effort: Pulmonary effort is normal.     Breath sounds: Normal breath sounds.  Abdominal:     General: Abdomen is flat.     Palpations: Abdomen is soft.  Musculoskeletal:        General: Normal range of motion.  Skin:    General: Skin is warm and dry.  Neurological:     General: No focal deficit present.     Mental Status: He is alert. Mental status is at baseline.  Psychiatric:        Attention and Perception: He is inattentive.        Mood and Affect: Mood normal. Affect is inappropriate.        Speech: He is noncommunicative.    Review of Systems  Unable to perform ROS: Language   Blood pressure 119/87, pulse 96, temperature 97.7 F (36.5 C), temperature source Oral, resp. rate 18, height 5\' 8"  (1.727 m), weight 85.8 kg, SpO2 100 %. Body mass index is 28.76 kg/m.   Treatment Plan Summary: Medication management and Plan no change to medication.  Tried to offer support as always daily.  Primary issue continues to be placement  , MD 06/21/2022, 12:36 PM

## 2022-06-21 NOTE — Progress Notes (Signed)
Patient tolerated Gean Birchwood injection well, without any issues. This writer administered injection to patient's right deltoid. Patient remains safe on the unit.

## 2022-06-21 NOTE — Progress Notes (Signed)
Recreation Therapy Notes    Date: 06/21/2022  Time: 10:40 am    Location: Craft room   Behavioral response: N/A   Intervention Topic: Decision Making   Discussion/Intervention: Patient refused to attend group.   Clinical Observations/Feedback:  Patient refused to attend group.    Clyde Upshaw LRT/CTRS         Adam [name] 06/21/2022 12:29 PM 

## 2022-06-21 NOTE — Plan of Care (Signed)
D- Patient alert and oriented to person and situation. Patient presented in a pleasant mood on assessment reporting on his self-inventory, with the help of night shift staff, that he slept "good" last night. Patient had no complaints at the time of assessment. Patient is childlike, due to a past brain injury, and has expressive aphasia. This makes it difficult for this writer to understand some things that he says. When this writer asked patient about SI, HI, AVH, and pain, he stated "no". Patient also denied any signs/symptoms of depression and anxiety. Patient has been in a much better mood than the previous day this Clinical research associate worked with him. Patient had no stated goals for today.  A- Scheduled medications administered to patient, per MD orders. Support and encouragement provided.  Routine safety checks conducted every 15 minutes.   R- No adverse drug reactions noted. Patient contracts for safety at this time. Patient compliant with medications and treatment plan. Patient receptive, calm, and cooperative. Patient remains safe at this time.  Problem: Education: Goal: Ability to state activities that reduce stress will improve Outcome: Progressing   Problem: Coping: Goal: Ability to identify and develop effective coping behavior will improve Outcome: Progressing   Problem: Self-Concept: Goal: Ability to identify factors that promote anxiety will improve Outcome: Progressing Goal: Level of anxiety will decrease Outcome: Progressing Goal: Ability to modify response to factors that promote anxiety will improve Outcome: Progressing   Problem: Education: Goal: Knowledge of General Education information will improve Description: Including pain rating scale, medication(s)/side effects and non-pharmacologic comfort measures Outcome: Progressing   Problem: Health Behavior/Discharge Planning: Goal: Ability to manage health-related needs will improve Outcome: Progressing   Problem: Clinical  Measurements: Goal: Ability to maintain clinical measurements within normal limits will improve Outcome: Progressing Goal: Will remain free from infection Outcome: Progressing Goal: Diagnostic test results will improve Outcome: Progressing Goal: Respiratory complications will improve Outcome: Progressing Goal: Cardiovascular complication will be avoided Outcome: Progressing   Problem: Activity: Goal: Risk for activity intolerance will decrease Outcome: Progressing   Problem: Nutrition: Goal: Adequate nutrition will be maintained Outcome: Progressing   Problem: Coping: Goal: Level of anxiety will decrease Outcome: Progressing   Problem: Elimination: Goal: Will not experience complications related to bowel motility Outcome: Progressing Goal: Will not experience complications related to urinary retention Outcome: Progressing   Problem: Pain Managment: Goal: General experience of comfort will improve Outcome: Progressing   Problem: Safety: Goal: Ability to remain free from injury will improve Outcome: Progressing   Problem: Skin Integrity: Goal: Risk for impaired skin integrity will decrease Outcome: Progressing

## 2022-06-22 NOTE — Plan of Care (Signed)
  Problem: Education: Goal: Ability to state activities that reduce stress will improve Outcome: Progressing   Problem: Coping: Goal: Ability to identify and develop effective coping behavior will improve Outcome: Progressing   Problem: Self-Concept: Goal: Ability to identify factors that promote anxiety will improve Outcome: Progressing Goal: Level of anxiety will decrease Outcome: Progressing Goal: Ability to modify response to factors that promote anxiety will improve Outcome: Progressing   Problem: Education: Goal: Knowledge of General Education information will improve Description: Including pain rating scale, medication(s)/side effects and non-pharmacologic comfort measures Outcome: Progressing   Problem: Skin Integrity: Goal: Risk for impaired skin integrity will decrease Outcome: Progressing   Problem: Safety: Goal: Ability to remain free from injury will improve Outcome: Progressing   Problem: Pain Managment: Goal: General experience of comfort will improve Outcome: Progressing   Problem: Elimination: Goal: Will not experience complications related to bowel motility Outcome: Progressing Goal: Will not experience complications related to urinary retention Outcome: Progressing

## 2022-06-22 NOTE — Progress Notes (Signed)
[facility] MD Progress Note  06/22/2022 3:07 PM Adam [name]  MRN:  [id number] Subjective: Follow-up 61 year old man with schizophrenia and aphasia.  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 No pertinent surgical history. Family History: History reviewed. No pertinent family history. Family Psychiatric  History: None Social History:  Social History   Substance and Sexual Activity  Alcohol Use Not Currently     Social History   Substance and Sexual Activity  Drug Use Not Currently    Social History   Socioeconomic History   Marital status: Single    Spouse name: Not on file   Number of children: Not on file   Years of education: Not on file   Highest education level: Not on file  Occupational History   Not on file  Tobacco Use   Smoking status: Never    Passive exposure: Never   Smokeless tobacco: Never  Vaping Use   Vaping Use: Unknown  Substance and Sexual Activity   Alcohol use: Not Currently   Drug use: Not Currently   Sexual activity: Not Currently  Other Topics Concern   Not on file  Social History Narrative   Not on file   Social Determinants of Health   Financial Resource Strain: Not on file  Food Insecurity: Not on file  Transportation Needs: Not on file  Physical Activity: Not on file  Stress: Not on file  Social Connections:  Not on file   Additional Social History:  Specify valuables returned:  (none)                      Sleep: Fair  Appetite:  Fair  Current Medications: Current Facility-Administered Medications  Medication Dose Route Frequency Provider Last Rate Last Admin   acetaminophen (TYLENOL) tablet 650 mg  650 mg Oral Q6H PRN Chelbi Herber T, MD   650 mg at 06/20/22 1351   alum & mag hydroxide-simeth (MAALOX/MYLANTA) 200-200-20 MG/5ML suspension 30 mL  30 mL Oral Q4H PRN Britley Gashi T, MD   30 mL at 04/26/22 4098   aspirin EC tablet 81 mg  81 mg Oral Daily Ellice Boultinghouse, [name], MD   81 mg at 06/22/22 0805   atorvastatin (LIPITOR) tablet 20 mg  20 mg Oral Daily Ja Pistole T, MD   20 mg at 06/22/22 0805   atropine 1 % ophthalmic solution 1 drop  1 drop Sublingual QID [name], MD   1 drop at 06/22/22 1158   benztropine (COGENTIN) tablet 0.5 mg  0.5 mg Oral BID Lashanti Chambless T, MD   0.5 mg at 06/22/22 0805   clonazePAM (KLONOPIN) tablet 1 mg  1 mg Oral TID PRN He, [name], MD   1 mg at 06/16/22 2103   cloZAPine (CLOZARIL) tablet 300 mg  300 mg Oral QHS Jrake Rodriquez T, MD   300 mg at 06/21/22 2110   diphenhydrAMINE (BENADRYL) capsule 50  mg  50 mg Oral Q6H PRN Jeral Zick T, MD   50 mg at 06/06/22 1613   Or   diphenhydrAMINE (BENADRYL) injection 50 mg  50 mg Intramuscular Q6H PRN Glessie Eustice T, MD       gabapentin (NEURONTIN) capsule 300 mg  300 mg Oral TID Takeyah Wieman T, MD   300 mg at 06/22/22 1158   haloperidol (HALDOL) tablet 5 mg  5 mg Oral TID Martiza Speth, [name], MD   5 mg at 06/22/22 1158   haloperidol (HALDOL) tablet 5 mg  5 mg Oral Q6H PRN Cindy Fullman, [name], MD   5 mg at 06/15/22 2112   Or   haloperidol lactate (HALDOL) injection 5 mg  5 mg Intramuscular Q6H PRN Strother Everitt T, MD       magnesium hydroxide (MILK OF MAGNESIA) suspension 30 mL  30 mL Oral Daily PRN Jaiveon Suppes T, MD   30 mL at 05/02/22 6720   metoprolol succinate (TOPROL-XL) 24 hr tablet 25 mg  25 mg Oral Daily  Jacari Iannello T, MD   25 mg at 06/22/22 0805   paliperidone (INVEGA SUSTENNA) injection 156 mg  156 mg Intramuscular Q28 days Yeng Perz T, MD   156 mg at 06/21/22 0917   temazepam (RESTORIL) capsule 15 mg  15 mg Oral QHS Izyan Ezzell T, MD   15 mg at 06/21/22 2110   ziprasidone (GEODON) injection 20 mg  20 mg Intramuscular Q12H PRN Rawn Quiroa, [name], MD   20 mg at 06/19/22 1000    Lab Results: No results found for this or any previous visit (from the past 48 hour(s)).  Blood Alcohol level:  Lab Results  Component Value Date   ETH <10 07/20/2021    Metabolic Disorder Labs: Lab Results  Component Value Date   HGBA1C 5.5 04/10/2022   MPG 111.15 04/10/2022   No results found for: "PROLACTIN" Lab Results  Component Value Date   CHOL 167 11/20/2021   TRIG 107 11/20/2021   HDL 26 (L) 11/20/2021   CHOLHDL 6.4 11/20/2021   VLDL 21 11/20/2021   LDLCALC 120 (H) 11/20/2021    Physical Findings: AIMS: Facial and Oral Movements Muscles of Facial Expression: None, normal Lips and Perioral Area: None, normal Jaw: None, normal Tongue: None, normal,Extremity Movements Upper (arms, wrists, hands, fingers): None, normal Lower (legs, knees, ankles, toes): None, normal, Trunk Movements Neck, shoulders, hips: None, normal, Overall Severity Severity of abnormal movements (highest score from questions above): None, normal Incapacitation due to abnormal movements: None, normal Patient's awareness of abnormal movements (rate only patient's report): No Awareness, Dental Status Current problems with teeth and/or dentures?: No Does patient usually wear dentures?: No  CIWA:    COWS:     Musculoskeletal: Strength & Muscle Tone: within normal limits Gait & Station: normal Patient leans: N/A  Psychiatric Specialty Exam:  Presentation  General Appearance: Appropriate for Environment; Casual; Neat; Well Groomed  Eye Contact:Fair  Speech:Slow  Speech  Volume:Decreased  Handedness:Left   Mood and Affect  Mood:Anxious  Affect:Congruent   Thought Process  Thought Processes:Goal Directed  Descriptions of Associations:Circumstantial  Orientation:Partial  Thought Content:Scattered  History of Schizophrenia/Schizoaffective disorder:Yes  Duration of Psychotic Symptoms:Greater than six months  Hallucinations:No data recorded Ideas of Reference:None  Suicidal Thoughts:No data recorded Homicidal Thoughts:No data recorded  Sensorium  Memory:Remote Good  Judgment:Fair  Insight:Fair   Executive Functions  Concentration:Fair  Attention Span:Fair  Recall:Fair  Fund of Knowledge:Fair  Language:Fair   Psychomotor Activity  Psychomotor  Activity:No data recorded  Assets  Assets:Housing; Resilience; Social Support; Talents/Skills   Sleep  Sleep:No data recorded   Physical Exam: Physical Exam Vitals and nursing note reviewed.  Constitutional:      Appearance: Normal appearance.  HENT:     Head: Normocephalic and atraumatic.     Mouth/Throat:     Pharynx: Oropharynx is clear.  Eyes:     Pupils: Pupils are equal, round, and reactive to light.  Cardiovascular:     Rate and Rhythm: Normal rate and regular rhythm.  Pulmonary:     Effort: Pulmonary effort is normal.     Breath sounds: Normal breath sounds.  Abdominal:     General: Abdomen is flat.     Palpations: Abdomen is soft.  Musculoskeletal:        General: Normal range of motion.  Skin:    General: Skin is warm and dry.  Neurological:     General: No focal deficit present.     Mental Status: He is alert. Mental status is at baseline.  Psychiatric:        Attention and Perception: He is inattentive.        Mood and Affect: Mood normal. Affect is blunt.        Speech: He is noncommunicative.    Review of Systems  Unable to perform ROS: Language  Constitutional: Negative.   HENT: Negative.    Eyes: Negative.   Respiratory: Negative.     Cardiovascular: Negative.   Gastrointestinal: Negative.   Musculoskeletal: Negative.   Skin: Negative.   Neurological: Negative.    Blood pressure 120/89, pulse 91, temperature 97.7 F (36.5 C), temperature source Oral, resp. rate 11, height 5\' 8"  (1.727 m), weight 85.8 kg, SpO2 100 %. Body mass index is 28.76 kg/m.   Treatment Plan Summary: Medication management and Plan stable and needing placement but with no major problematic behaviors or acute dangerousness other than his inability to care for himself.  Still mainly focusing on placement  , MD 06/22/2022, 3:07 PM

## 2022-06-22 NOTE — Plan of Care (Signed)
D: Pt alert and oriented. Pt rates depression 0/10, hopelessness 0/10, and anxiety 0/10. Pt reports energy level as normal and concentration as being good. Pt reports sleep last night as being good. Pt did receive medications for sleep. Pt denies experiencing any pain at this time. Pt denies experiencing any SI/HI, or AVH at this time.   A: Scheduled medications administered to pt, per MD orders. Support and encouragement provided. Frequent verbal contact made. Routine safety checks conducted q15 minutes.   R: No adverse drug reactions noted. Pt verbally contracts for safety at this time. Pt compliant with medications and treatment plan. Pt interacts minimally with others on the unit, mostly self isolated to room. Pt remains safe at this time. Will continue to monitor.   Problem: Nutrition: Goal: Adequate nutrition will be maintained Outcome: Progressing   Problem: Education: Goal: Ability to state activities that reduce stress will improve Outcome: Not Progressing

## 2022-06-22 NOTE — Progress Notes (Signed)
Recreation Therapy Notes  Date: 06/22/2022  Time: 10:10 am    Location: Craft room   Behavioral response: N/A   Intervention Topic: Self-care   Discussion/Intervention: Patient refused to attend group.   Clinical Observations/Feedback:  Patient refused to attend group.    Joylene Wescott LRT/CTRS        Dayana Dalporto 06/22/2022 12:12 PM

## 2022-06-23 NOTE — Plan of Care (Signed)
D: Pt alert and oriented. Pt denies experiencing any anxiety at this time however, endorses depression related to not discharging and still being here. Pt denies experiencing any pain at this time. Pt denies experiencing any SI/HI, or AVH at this time.   This afternoon patient was observed sad/depressed/crying in room. Pt continues to state [facility]. This writer spent therapeutic time with patient this afternoon to boost mood/affect, which appears to have been effective.  A: Scheduled medications administered to pt, per MD orders. Support and encouragement provided. Frequent verbal contact made. Routine safety checks conducted q15 minutes.   R: No adverse drug reactions noted. Pt verbally contracts for safety at this time. Pt compliant with medications. Pt interacts well with others on the unit. Pt remains safe at this time. Will continue to monitor.   Problem: Self-Concept: Goal: Level of anxiety will decrease Outcome: Progressing Goal: Ability to modify response to factors that promote anxiety will improve Outcome: Progressing

## 2022-06-23 NOTE — Progress Notes (Signed)
[facility] MD Progress Note  03/31/2022 3:20 PM Adam [name]  MRN:  [id number]  Principal Problem: Difficulty controlling behavior as late effect of traumatic brain injury [facility]) Diagnosis: Principal Problem:   Difficulty controlling behavior as late effect of traumatic brain injury 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  Interval History Patient was seen today for re-evaluation.  Nursing reports patient is stable and compliant with medications.   Subjective:   Patient seen in his room, observed watching TV. He reports "I am okay. I am tired. I am bored." Minimally-verbal. Calm. Denies any complaints. Reports "I slept, I ate".  Total Time spent with patient: 15 minutes  Past Psychiatric History: see H&P  Past Medical History:  Past Medical History:  Diagnosis Date   Myocardial infarction (HCC)    Schizophrenia (HCC)    Stroke (HCC)    TBI (traumatic brain injury) (HCC)    History reviewed. No pertinent surgical history. Family History: History reviewed. No pertinent family history. Family Psychiatric  History:  Social History:  Social History   Substance and Sexual Activity  Alcohol Use Not Currently     Social History   Substance and Sexual Activity  Drug Use Not Currently    Social History   Socioeconomic History   Marital status: Single    Spouse name: Not on file   Number of children: Not on file   Years of education: Not on file   Highest education level: Not on file  Occupational History   Not on file  Tobacco Use   Smoking status: Never    Passive exposure: Never   Smokeless tobacco: Never  Vaping Use   Vaping Use: Unknown  Substance and Sexual Activity   Alcohol use: Not Currently   Drug use: Not Currently   Sexual activity: Not Currently  Other Topics Concern   Not on file   Social History Narrative   Not on file   Social Determinants of Health   Financial Resource Strain: Not on file  Food Insecurity: Not on file  Transportation Needs: Not on file  Physical Activity: Not on file  Stress: Not on file  Social Connections: Not on file   Additional Social History:  Specify valuables returned:  (none)                      Sleep: Good  Appetite:  Good  Current Medications: Current Facility-Administered Medications  Medication Dose Route Frequency Provider Last Rate Last Admin   acetaminophen (TYLENOL) tablet 650 mg  650 mg Oral Q6H PRN [name], [name], MD   650 mg at 06/20/22 1351   alum & mag hydroxide-simeth (MAALOX/MYLANTA) 200-200-20 MG/5ML suspension 30 mL  30 mL Oral Q4H PRN [name], [name], MD   30 mL at 04/26/22 0086   aspirin EC tablet 81 mg  81 mg Oral Daily [name], [name], MD   81 mg at 06/23/22 7619   atorvastatin (LIPITOR) tablet 20 mg  20 mg Oral Daily [name], [name], MD   20 mg at 06/23/22 0829   atropine 1 % ophthalmic solution 1 drop  1 drop Sublingual QID [name], MD   1 drop at 06/23/22 1225   benztropine (COGENTIN) tablet 0.5 mg  0.5 mg Oral BID [name], [name], MD   0.5 mg at 06/23/22 [phone]  clonazePAM (KLONOPIN) tablet 1 mg  1 mg Oral TID PRN He, [name], MD   1 mg at 06/22/22 2115   cloZAPine (CLOZARIL) tablet 300 mg  300 mg Oral QHS [name], [name], MD   300 mg at 06/22/22 2115   diphenhydrAMINE (BENADRYL) capsule 50 mg  50 mg Oral Q6H PRN [name], [name], MD   50 mg at 06/06/22 1613   Or   diphenhydrAMINE (BENADRYL) injection 50 mg  50 mg Intramuscular Q6H PRN [name], [name], MD       gabapentin (NEURONTIN) capsule 300 mg  300 mg Oral TID [name], [name], MD   300 mg at 06/23/22 1224   haloperidol (HALDOL) tablet 5 mg  5 mg Oral TID [name], [name], MD   5 mg at 06/23/22 1224   haloperidol (HALDOL) tablet 5 mg  5 mg Oral Q6H PRN [name], [name], MD   5 mg at 06/15/22 2112   Or   haloperidol lactate (HALDOL)  injection 5 mg  5 mg Intramuscular Q6H PRN [name], [name], MD       magnesium hydroxide (MILK OF MAGNESIA) suspension 30 mL  30 mL Oral Daily PRN [name], [name], MD   30 mL at 05/02/22 8563   metoprolol succinate (TOPROL-XL) 24 hr tablet 25 mg  25 mg Oral Daily [name], [name], MD   25 mg at 06/23/22 0830   paliperidone (INVEGA SUSTENNA) injection 156 mg  156 mg Intramuscular Q28 days [name], [name], MD   156 mg at 06/21/22 0917   temazepam (RESTORIL) capsule 15 mg  15 mg Oral QHS [name], [name], MD   15 mg at 06/22/22 2115   ziprasidone (GEODON) injection 20 mg  20 mg Intramuscular Q12H PRN [name], [name], MD   20 mg at 06/19/22 1000    Lab Results: No results found for this or any previous visit (from the past 48 hour(s)).  Blood Alcohol level:  Lab Results  Component Value Date   ETH <10 07/20/2021    Metabolic Disorder Labs: Lab Results  Component Value Date   HGBA1C 5.5 04/10/2022   MPG 111.15 04/10/2022   No results found for: "PROLACTIN" Lab Results  Component Value Date   CHOL 167 11/20/2021   TRIG 107 11/20/2021   HDL 26 (L) 11/20/2021   CHOLHDL 6.4 11/20/2021   VLDL 21 11/20/2021   LDLCALC 120 (H) 11/20/2021    Physical Findings: AIMS: Facial and Oral Movements Muscles of Facial Expression: None, normal Lips and Perioral Area: None, normal Jaw: None, normal Tongue: None, normal,Extremity Movements Upper (arms, wrists, hands, fingers): None, normal Lower (legs, knees, ankles, toes): None, normal, Trunk Movements Neck, shoulders, hips: None, normal, Overall Severity Severity of abnormal movements (highest score from questions above): None, normal Incapacitation due to abnormal movements: None, normal Patient's awareness of abnormal movements (rate only patient's report): No Awareness, Dental Status Current problems with teeth and/or dentures?: No Does patient usually wear dentures?: No  CIWA:    COWS:     Musculoskeletal: Strength & Muscle Tone: within  normal limits Gait & Station: normal Patient leans: N/A  Psychiatric Specialty Exam:  Presentation  General Appearance: Appropriate for Environment; Casual; Neat; Well Groomed  Eye Contact:Fair  Speech:Slow  Speech Volume:Decreased  Handedness:Left   Mood and Affect  Mood:Anxious  Affect:Congruent   Thought Process  Thought Processes:Goal Directed  Descriptions of Associations:Circumstantial  Orientation:Partial  Thought Content:Scattered  History of Schizophrenia/Schizoaffective disorder:Yes  Duration of Psychotic Symptoms:Greater than  six months  Hallucinations:No data recorded Ideas of Reference:None  Suicidal Thoughts:No data recorded Homicidal Thoughts:No data recorded  Sensorium  Memory:Remote Good  Judgment:Fair  Insight:Fair   Executive Functions  Concentration:Fair  Attention Span:Fair  Recall:Fair  Fund of Knowledge:Fair  Language:Fair   Psychomotor Activity  Psychomotor Activity:No data recorded  Assets  Assets:Housing; Resilience; Social Support; Talents/Skills   Sleep  Sleep:No data recorded   Physical Exam: Physical Exam ROS Blood pressure 113/68, pulse (!) 105, temperature 97.8 F (36.6 C), temperature source Oral, resp. rate 16, height 5\' 8"  (1.727 m), weight 85.8 kg, SpO2 99 %. Body mass index is 28.76 kg/m.   Treatment Plan Summary: Daily contact with patient to assess and evaluate symptoms and progress in treatment and Medication management  Plan no change to current medication.  Supportive counseling and encouragement.  -continue scheduled medications:  aspirin EC  81 mg Oral Daily   atorvastatin  20 mg Oral Daily   atropine  1 drop Sublingual QID   benztropine  0.5 mg Oral BID   cloZAPine  300 mg Oral QHS   gabapentin  300 mg Oral TID   haloperidol  5 mg Oral TID   metoprolol succinate  25 mg Oral Daily   paliperidone  156 mg Intramuscular Q28 days   temazepam  15 mg Oral QHS    -continue PRN  medications. acetaminophen, alum & mag hydroxide-simeth, clonazePAM, diphenhydrAMINE **OR** diphenhydrAMINE, haloperidol **OR** haloperidol lactate, magnesium hydroxide, ziprasidone   , MD 06/23/2022, 1:28 PM

## 2022-06-23 NOTE — Progress Notes (Signed)
Patient alert and oriented x 4, affect is blunted thoughts are disorganized, no distress noted he is interacting appropriately with peers and staff,  he was complaint with medication this shift. Patient denies SI/HI/AVH.15 minutes safety checks maintained will closely monitor. 

## 2022-06-24 NOTE — Progress Notes (Signed)
Patient has been pleasant.  Reports having a good day, when asked how his day was.  He has been in his room singing loudly which is a good indicator that he is in a good mood.  He is med compliant and received meds without incident. He denies si/hi/avh.  He does endorse depression when asked but does not rate his level of depression. He is active on the unit and spends time between the dayrooms on the unit. Q 15 min safety checks in place and support and encouragement offered.     C Butler-Nicholson, LPN

## 2022-06-24 NOTE — [facility] Group Notes (Signed)
LCSW Wellness Group Note   06/24/2022 1:30pm  Type of Group and Topic: Psychoeducational Group:  Wellness  Participation Level:  NA  Description of Group  Wellness group introduces the topic and its focus on developing healthy habits across the spectrum and its relationship to a decrease in hospital admissions.  Six areas of wellness are discussed: physical, social spiritual, intellectual, occupational, and emotional.  Patients are asked to consider their current wellness habits and to identify areas of wellness where they are interested and able to focus on improvements.    Therapeutic Goals Patients will understand components of wellness and how they can positively impact overall health.  Patients will identify areas of wellness where they have developed good habits. Patients will identify areas of wellness where they would like to make improvements.    Summary of Patient Progress: pt did not attend group.      Therapeutic Modalities: Cognitive Behavioral Therapy Psychoeducation    Lajuana Patchell Jon, LCSW  

## 2022-06-24 NOTE — Progress Notes (Signed)
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  Interval History Patient was seen today for re-evaluation.  Nursing reports patient is stable and compliant with medications.   Subjective:   Patient seen in his room, observed doing something in his phone. He says "Hi. Okay." Minimally-verbal. Calm. Denies any complaints. Reports doing "fine".  Total Time spent with patient: 15 minutes  Past Psychiatric History: see H&P  Past Medical History:  Past Medical History:  Diagnosis Date   Myocardial infarction (HCC)    Schizophrenia (HCC)    Stroke (HCC)    TBI (traumatic brain injury) (HCC)    History reviewed. No pertinent surgical history. Family History: History reviewed. No pertinent family history. Family Psychiatric  History:  Social History:  Social History   Substance and Sexual Activity  Alcohol Use Not Currently     Social History   Substance and Sexual Activity  Drug Use Not Currently    Social History   Socioeconomic History   Marital status: Single    Spouse name: Not on file   Number of children: Not on file   Years of education: Not on file   Highest education level: Not on file  Occupational History   Not on file  Tobacco Use   Smoking status: Never    Passive exposure: Never   Smokeless tobacco: Never  Vaping Use   Vaping Use: Unknown  Substance and Sexual Activity   Alcohol use: Not Currently   Drug use: Not Currently   Sexual activity: Not Currently  Other Topics Concern   Not on file  Social  History Narrative   Not on file   Social Determinants of Health   Financial Resource Strain: Not on file  Food Insecurity: Not on file  Transportation Needs: Not on file  Physical Activity: Not on file  Stress: Not on file  Social Connections: Not on file   Additional Social History:  Specify valuables returned:  (none)                      Sleep: Good  Appetite:  Good  Current Medications: Current Facility-Administered Medications  Medication Dose Route Frequency Provider Last Rate Last Admin   acetaminophen (TYLENOL) tablet 650 mg  650 mg Oral Q6H PRN [name], [name], MD   650 mg at 06/20/22 1351   alum & mag hydroxide-simeth (MAALOX/MYLANTA) 200-200-20 MG/5ML suspension 30 mL  30 mL Oral Q4H PRN [name], [name], MD   30 mL at 04/26/22 8469   aspirin EC tablet 81 mg  81 mg Oral Daily [name], [name], MD   81 mg at 06/24/22 0840   atorvastatin (LIPITOR) tablet 20 mg  20 mg Oral Daily [name], [name], MD   20 mg at 06/24/22 0840   atropine 1 % ophthalmic solution 1 drop  1 drop Sublingual QID [name], MD   1 drop at 06/24/22 0840   benztropine (COGENTIN) tablet 0.5 mg  0.5 mg Oral BID [name], [name], MD   0.5 mg at 06/24/22 0840   clonazePAM (KLONOPIN) tablet 1 mg  1 mg Oral TID PRN He, [name], MD   1 mg at 06/22/22 2115   cloZAPine (CLOZARIL) tablet 300 mg  300 mg Oral QHS [name], [name], MD   300 mg at 06/23/22 2125   diphenhydrAMINE (BENADRYL) capsule 50 mg  50 mg Oral Q6H PRN [name], [name], MD   50 mg at 06/06/22 1613   Or   diphenhydrAMINE (BENADRYL) injection 50 mg  50 mg Intramuscular Q6H PRN [name], [name], MD       gabapentin (NEURONTIN) capsule 300 mg  300 mg Oral TID [name], [name], MD   300 mg at 06/24/22 0841   haloperidol (HALDOL) tablet 5 mg  5 mg Oral TID [name], [name], MD   5 mg at 06/24/22 0840   haloperidol (HALDOL) tablet 5 mg  5 mg Oral Q6H PRN [name], [name], MD   5 mg at 06/15/22 2112   Or   haloperidol lactate (HALDOL) injection 5 mg   5 mg Intramuscular Q6H PRN [name], [name], MD       magnesium hydroxide (MILK OF MAGNESIA) suspension 30 mL  30 mL Oral Daily PRN [name], [name], MD   30 mL at 05/02/22 4665   metoprolol succinate (TOPROL-XL) 24 hr tablet 25 mg  25 mg Oral Daily [name], [name], MD   25 mg at 06/23/22 0830   paliperidone (INVEGA SUSTENNA) injection 156 mg  156 mg Intramuscular Q28 days [name], [name], MD   156 mg at 06/21/22 0917   temazepam (RESTORIL) capsule 15 mg  15 mg Oral QHS [name], [name], MD   15 mg at 06/23/22 2125   ziprasidone (GEODON) injection 20 mg  20 mg Intramuscular Q12H PRN [name], [name], MD   20 mg at 06/19/22 1000    Lab Results: No results found for this or any previous visit (from the past 48 hour(s)).  Blood Alcohol level:  Lab Results  Component Value Date   ETH <10 07/20/2021    Metabolic Disorder Labs: Lab Results  Component Value Date   HGBA1C 5.5 04/10/2022   MPG 111.15 04/10/2022   No results found for: "PROLACTIN" Lab Results  Component Value Date   CHOL 167 11/20/2021   TRIG 107 11/20/2021   HDL 26 (L) 11/20/2021   CHOLHDL 6.4 11/20/2021   VLDL 21 11/20/2021   LDLCALC 120 (H) 11/20/2021    Physical Findings: AIMS: Facial and Oral Movements Muscles of Facial Expression: None, normal Lips and Perioral Area: None, normal Jaw: None, normal Tongue: None, normal,Extremity Movements Upper (arms, wrists, hands, fingers): None, normal Lower (legs, knees, ankles, toes): None, normal, Trunk Movements Neck, shoulders, hips: None, normal, Overall Severity Severity of abnormal movements (highest score from questions above): None, normal Incapacitation due to abnormal movements: None, normal Patient's awareness of abnormal movements (rate only patient's report): No Awareness, Dental Status Current problems with teeth and/or dentures?: No Does patient usually wear dentures?: No  CIWA:    COWS:     Musculoskeletal: Strength & Muscle Tone: within normal  limits Gait & Station: normal Patient leans: N/A  Psychiatric Specialty Exam:  Presentation  General Appearance: Appropriate for Environment; Casual; Neat; Well Groomed  Eye Contact:Fair  Speech:Slow  Speech Volume:Decreased  Handedness:Left   Mood and Affect  Mood:Anxious  Affect:Congruent   Thought Process  Thought Processes:Goal Directed  Descriptions of Associations:Circumstantial  Orientation:Partial  Thought Content:Scattered  History of Schizophrenia/Schizoaffective disorder:Yes  Duration of Psychotic Symptoms:Greater than six months  Hallucinations:No data recorded  Ideas of Reference:None  Suicidal Thoughts:No data recorded Homicidal Thoughts:No data recorded  Sensorium  Memory:Remote Good  Judgment:Fair  Insight:Fair   Executive Functions  Concentration:Fair  Attention Span:Fair  Recall:Fair  Fund of Knowledge:Fair  Language:Fair   Psychomotor Activity  Psychomotor Activity:No data recorded  Assets  Assets:Housing; Resilience; Social Support; Talents/Skills   Sleep  Sleep:No data recorded   Physical Exam: Physical Exam ROS Blood pressure (!) 81/62, pulse 99, temperature 98.2 F (36.8 C), temperature source Oral, resp. rate 16, height 5\' 8"  (1.727 m), weight 85.8 kg, SpO2 98 %. Body mass index is 28.76 kg/m.   Treatment Plan Summary: Daily contact with patient to assess and evaluate symptoms and progress in treatment and Medication management  Plan no change to current medication.  Supportive counseling and encouragement.  -continue scheduled medications:  aspirin EC  81 mg Oral Daily   atorvastatin  20 mg Oral Daily   atropine  1 drop Sublingual QID   benztropine  0.5 mg Oral BID   cloZAPine  300 mg Oral QHS   gabapentin  300 mg Oral TID   haloperidol  5 mg Oral TID   metoprolol succinate  25 mg Oral Daily   paliperidone  156 mg Intramuscular Q28 days   temazepam  15 mg Oral QHS    -continue PRN  medications. acetaminophen, alum & mag hydroxide-simeth, clonazePAM, diphenhydrAMINE **OR** diphenhydrAMINE, haloperidol **OR** haloperidol lactate, magnesium hydroxide, ziprasidone   , MD 06/24/2022, 10:00 AM

## 2022-06-24 NOTE — Plan of Care (Signed)
  Problem: Coping: Goal: Ability to identify and develop effective coping behavior will improve Outcome: Not Progressing   Problem: Safety: Goal: Ability to remain free from injury will improve Outcome: Progressing

## 2022-06-24 NOTE — Plan of Care (Signed)
D:    Patient alert and oriented.  Patient reports depression and frustration with continued stay in the hospital.  Patient denies SI/HI and AVH.  Denies pain.  A:   Medications administered  as ordered.  Offered encouragement and emotional support.  Q15 minute checks in place for safety.  Frequent verbal interaction with patient.   Pt became agitated, repeatedly yelling "[place]!"  Attempts to verbally de-escalate pt were unsuccessful.  Pt given PRN medication.   R:    Patient compliant with medications.  No adverse reactions to medications.  Minimal interaction with peers.  Pt remains safe on unit.  Pt interacting with peers at the end of shift.          Problem: Nutrition: Goal: Adequate nutrition will be maintained Outcome: Progressing   Problem: Activity: Goal: Interest or engagement in leisure activities will improve Outcome: Not Progressing   Problem: Coping: Goal: Coping ability will improve Outcome: Not Progressing

## 2022-06-25 NOTE — Progress Notes (Signed)
Patient pleasant this shift. Noted in dayroom pleasant and cooperative with staff , appropriate with peers. Noted smiling and looking at printed pictures. Denies any SI, HI, AVH.  Encouragement and support provided. Safety checks maintained. Meds given as prescribed. Pt receptive and remains safe on unit with q 15 min checks.

## 2022-06-25 NOTE — Progress Notes (Signed)
Recreation Therapy Notes  Date: 06/25/2022   Time: 10:50 am     Location: Craft room    Behavioral response: N/A   Intervention Topic: Honesty    Discussion/Intervention: Patient refused to attend group.    Clinical Observations/Feedback:  Patient refused to attend group.    Jalyiah Shelley LRT/CTRS        Tarisa Paola 06/25/2022 11:07 AM

## 2022-06-25 NOTE — Plan of Care (Signed)
No irritable behaviors noted. Patient at the nurses station multiple times with the same questions. Patient loud at times with his usual singing. Compliant with medications and meals. Support and encouragement given.

## 2022-06-25 NOTE — Progress Notes (Signed)
[facility] MD Progress Note  06/25/2022 3:03 PM Adam [name]  MRN:  [id number] Subjective: Follow-up 61 year old man with schizophrenia and aphasia.  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 No pertinent surgical history. Family History: History reviewed. No pertinent family history. Family Psychiatric  History: None Social History:  Social History   Substance and Sexual Activity  Alcohol Use Not Currently     Social History   Substance and Sexual Activity  Drug Use Not Currently    Social History   Socioeconomic History   Marital status: Single    Spouse name: Not on file   Number of children: Not on file   Years of education: Not on file   Highest education level: Not on file  Occupational History   Not on file  Tobacco Use   Smoking status: Never    Passive exposure: Never   Smokeless tobacco: Never  Vaping Use   Vaping Use: Unknown  Substance and Sexual Activity   Alcohol use: Not Currently   Drug use: Not Currently   Sexual activity: Not Currently  Other Topics Concern   Not on file  Social History Narrative   Not on file   Social Determinants of Health   Financial Resource Strain: Not on file  Food Insecurity: Not on file  Transportation Needs: Not on file  Physical Activity: Not on file  Stress: Not on file  Social Connections:  Not on file   Additional Social History:  Specify valuables returned:  (none)                      Sleep: Fair  Appetite:  Fair  Current Medications: Current Facility-Administered Medications  Medication Dose Route Frequency Provider Last Rate Last Admin   acetaminophen (TYLENOL) tablet 650 mg  650 mg Oral Q6H PRN Johnedward Brodrick T, MD   650 mg at 06/20/22 1351   alum & mag hydroxide-simeth (MAALOX/MYLANTA) 200-200-20 MG/5ML suspension 30 mL  30 mL Oral Q4H PRN Levander Katzenstein T, MD   30 mL at 04/26/22 4332   aspirin EC tablet 81 mg  81 mg Oral Daily Wyonia Fontanella, [name], MD   81 mg at 06/25/22 9518   atorvastatin (LIPITOR) tablet 20 mg  20 mg Oral Daily Jaquavian Firkus T, MD   20 mg at 06/25/22 0807   atropine 1 % ophthalmic solution 1 drop  1 drop Sublingual QID [name], MD   1 drop at 06/25/22 1207   benztropine (COGENTIN) tablet 0.5 mg  0.5 mg Oral BID Marletta Bousquet T, MD   0.5 mg at 06/25/22 8416   clonazePAM (KLONOPIN) tablet 1 mg  1 mg Oral TID PRN He, [name], MD   1 mg at 06/22/22 2115   cloZAPine (CLOZARIL) tablet 300 mg  300 mg Oral QHS Fabio Wah T, MD   300 mg at 06/24/22 2045   diphenhydrAMINE (BENADRYL) capsule 50  mg  50 mg Oral Q6H PRN Rex Magee T, MD   50 mg at 06/06/22 1613   Or   diphenhydrAMINE (BENADRYL) injection 50 mg  50 mg Intramuscular Q6H PRN Andersson Larrabee T, MD       gabapentin (NEURONTIN) capsule 300 mg  300 mg Oral TID Raquel Sayres T, MD   300 mg at 06/25/22 1206   haloperidol (HALDOL) tablet 5 mg  5 mg Oral TID Wenceslao Loper, [name], MD   5 mg at 06/25/22 1206   haloperidol (HALDOL) tablet 5 mg  5 mg Oral Q6H PRN Ercel Normoyle, [name], MD   5 mg at 06/15/22 2112   Or   haloperidol lactate (HALDOL) injection 5 mg  5 mg Intramuscular Q6H PRN Kahley Leib T, MD       magnesium hydroxide (MILK OF MAGNESIA) suspension 30 mL  30 mL Oral Daily PRN Mauricia Mertens T, MD   30 mL at 05/02/22 7591   metoprolol succinate (TOPROL-XL) 24 hr tablet 25 mg  25 mg Oral Daily  Flannery Cavallero T, MD   25 mg at 06/25/22 0808   paliperidone (INVEGA SUSTENNA) injection 156 mg  156 mg Intramuscular Q28 days Isais Klipfel T, MD   156 mg at 06/21/22 0917   temazepam (RESTORIL) capsule 15 mg  15 mg Oral QHS Katiria Calame T, MD   15 mg at 06/24/22 2045   ziprasidone (GEODON) injection 20 mg  20 mg Intramuscular Q12H PRN Corby Villasenor, [name], MD   20 mg at 06/24/22 1045    Lab Results: No results found for this or any previous visit (from the past 48 hour(s)).  Blood Alcohol level:  Lab Results  Component Value Date   ETH <10 07/20/2021    Metabolic Disorder Labs: Lab Results  Component Value Date   HGBA1C 5.5 04/10/2022   MPG 111.15 04/10/2022   No results found for: "PROLACTIN" Lab Results  Component Value Date   CHOL 167 11/20/2021   TRIG 107 11/20/2021   HDL 26 (L) 11/20/2021   CHOLHDL 6.4 11/20/2021   VLDL 21 11/20/2021   LDLCALC 120 (H) 11/20/2021    Physical Findings: AIMS: Facial and Oral Movements Muscles of Facial Expression: None, normal Lips and Perioral Area: None, normal Jaw: None, normal Tongue: None, normal,Extremity Movements Upper (arms, wrists, hands, fingers): None, normal Lower (legs, knees, ankles, toes): None, normal, Trunk Movements Neck, shoulders, hips: None, normal, Overall Severity Severity of abnormal movements (highest score from questions above): None, normal Incapacitation due to abnormal movements: None, normal Patient's awareness of abnormal movements (rate only patient's report): No Awareness, Dental Status Current problems with teeth and/or dentures?: No Does patient usually wear dentures?: No  CIWA:    COWS:     Musculoskeletal: Strength & Muscle Tone: within normal limits Gait & Station: normal Patient leans: N/A  Psychiatric Specialty Exam:  Presentation  General Appearance: Appropriate for Environment; Casual; Neat; Well Groomed  Eye Contact:Fair  Speech:Slow  Speech  Volume:Decreased  Handedness:Left   Mood and Affect  Mood:Anxious  Affect:Congruent   Thought Process  Thought Processes:Goal Directed  Descriptions of Associations:Circumstantial  Orientation:Partial  Thought Content:Scattered  History of Schizophrenia/Schizoaffective disorder:Yes  Duration of Psychotic Symptoms:Greater than six months  Hallucinations:No data recorded Ideas of Reference:None  Suicidal Thoughts:No data recorded Homicidal Thoughts:No data recorded  Sensorium  Memory:Remote Good  Judgment:Fair  Insight:Fair   Executive Functions  Concentration:Fair  Attention Span:Fair  Recall:Fair  Fund of Knowledge:Fair  Language:Fair   Psychomotor Activity  Psychomotor  Activity:No data recorded  Assets  Assets:Housing; Resilience; Social Support; Talents/Skills   Sleep  Sleep:No data recorded   Physical Exam: Physical Exam Vitals and nursing note reviewed.  Constitutional:      Appearance: Normal appearance.  HENT:     Head: Normocephalic and atraumatic.     Mouth/Throat:     Pharynx: Oropharynx is clear.  Eyes:     Pupils: Pupils are equal, round, and reactive to light.  Cardiovascular:     Rate and Rhythm: Normal rate and regular rhythm.  Pulmonary:     Effort: Pulmonary effort is normal.     Breath sounds: Normal breath sounds.  Abdominal:     General: Abdomen is flat.     Palpations: Abdomen is soft.  Musculoskeletal:        General: Normal range of motion.  Skin:    General: Skin is warm and dry.  Neurological:     General: No focal deficit present.     Mental Status: He is alert. Mental status is at baseline.  Psychiatric:        Attention and Perception: He is inattentive.        Mood and Affect: Mood normal. Affect is blunt.        Speech: He is noncommunicative.    Review of Systems  Unable to perform ROS: Language  Constitutional: Negative.   HENT: Negative.    Eyes: Negative.   Respiratory: Negative.     Cardiovascular: Negative.   Gastrointestinal: Negative.   Musculoskeletal: Negative.   Skin: Negative.   Neurological: Negative.    Blood pressure (!) 152/103, pulse (!) 103, temperature 98.9 F (37.2 C), temperature source Oral, resp. rate 16, height 5\' 8"  (1.727 m), weight 85.8 kg, SpO2 98 %. Body mass index is 28.76 kg/m.   Treatment Plan Summary: Medication management and Plan stable and needing placement but with no major problematic behaviors or acute dangerousness other than his inability to care for himself.  Still mainly focusing on placement  , MD 06/25/2022, 3:03 PM

## 2022-06-26 NOTE — Progress Notes (Signed)
Patient came out for breakfast and returned to his room. He is compliant with vital signs and medications. Patient is pleasant. Denies SI, HI, and AVH.

## 2022-06-26 NOTE — Progress Notes (Signed)
Recreation Therapy Notes   Date: 06/26/2022  Time: 10:10 am    Location: Craft room   Behavioral response: N/A   Intervention Topic: Time Management    Discussion/Intervention: Patient refused to attend group.   Clinical Observations/Feedback:  Patient refused to attend group.    Lorain Keast LRT/CTRS        Felma Pfefferle 06/26/2022 1:09 PM

## 2022-06-26 NOTE — [facility] Group Notes (Signed)
[facility] Group Notes:  (Nursing/MHT/Case Management/Adjunct)  Date:  06/26/2022  Time:  9:49 AM  Type of Therapy:   Community Meeting  Participation Level:  Did Not Attend  Participation Quality:   N/A  Affect:   N/A  Cognitive:   N/a  Insight:  None  Engagement in Group:   N/A  Modes of Intervention:   N/A  Summary of Progress/Problems:  Rodena Goldmann 06/26/2022, 9:49 AM

## 2022-06-26 NOTE — Progress Notes (Signed)
[facility] MD Progress Note  06/26/2022 3:38 PM Adam [name]  MRN:  [id number] Subjective: No new complaints.  Behavior stable. 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 No pertinent surgical history. Family History: History reviewed. No pertinent family history. Family Psychiatric  History: See previous Social History:  Social History   Substance and Sexual Activity  Alcohol Use Not Currently     Social History   Substance and Sexual Activity  Drug Use Not Currently    Social History   Socioeconomic History   Marital status: Single    Spouse name: Not on file   Number of children: Not on file   Years of education: Not on file   Highest education level: Not on file  Occupational History   Not on file  Tobacco Use   Smoking status: Never    Passive exposure: Never   Smokeless tobacco: Never  Vaping Use   Vaping Use: Unknown  Substance and Sexual Activity   Alcohol use: Not Currently   Drug use: Not Currently   Sexual activity: Not Currently  Other Topics Concern   Not on file  Social History Narrative   Not on file   Social Determinants of Health   Financial Resource Strain: Not on file  Food Insecurity: Not on file  Transportation Needs: Not on file  Physical Activity: Not on file  Stress: Not on file  Social Connections: Not on file    Additional Social History:  Specify valuables returned:  (none)                      Sleep: Fair  Appetite:  Fair  Current Medications: Current Facility-Administered Medications  Medication Dose Route Frequency Provider Last Rate Last Admin   acetaminophen (TYLENOL) tablet 650 mg  650 mg Oral Q6H PRN Mavery Milling T, MD   650 mg at 06/20/22 1351   alum & mag hydroxide-simeth (MAALOX/MYLANTA) 200-200-20 MG/5ML suspension 30 mL  30 mL Oral Q4H PRN Jaleiah Asay T, MD   30 mL at 04/26/22 5885   aspirin EC tablet 81 mg  81 mg Oral Daily Lalana Wachter, [name], MD   81 mg at 06/26/22 0805   atorvastatin (LIPITOR) tablet 20 mg  20 mg Oral Daily Alayziah Tangeman T, MD   20 mg at 06/26/22 0805   atropine 1 % ophthalmic solution 1 drop  1 drop Sublingual QID [name], MD   1 drop at 06/26/22 1145   benztropine (COGENTIN) tablet 0.5 mg  0.5 mg Oral BID Tiffney Haughton T, MD   0.5 mg at 06/26/22 0805   clonazePAM (KLONOPIN) tablet 1 mg  1 mg Oral TID PRN He, [name], MD   1 mg at 06/22/22 2115   cloZAPine (CLOZARIL) tablet 300 mg  300 mg Oral QHS Ixchel Duck T, MD   300 mg at 06/25/22 2106   diphenhydrAMINE (BENADRYL) capsule 50 mg  50  mg Oral Q6H PRN Melena Hayes, [name], MD   50 mg at 06/06/22 1613   Or   diphenhydrAMINE (BENADRYL) injection 50 mg  50 mg Intramuscular Q6H PRN Tina Temme T, MD       gabapentin (NEURONTIN) capsule 300 mg  300 mg Oral TID Welden Hausmann T, MD   300 mg at 06/26/22 1145   haloperidol (HALDOL) tablet 5 mg  5 mg Oral TID Verneta Hamidi T, MD   5 mg at 06/26/22 1145   haloperidol (HALDOL) tablet 5 mg  5 mg Oral Q6H PRN Guynell Kleiber, [name], MD   5 mg at 06/15/22 2112   Or   haloperidol lactate (HALDOL) injection 5 mg  5 mg Intramuscular Q6H PRN Tilak Oakley T, MD       magnesium hydroxide (MILK OF MAGNESIA) suspension 30 mL  30 mL Oral Daily PRN Alicen Donalson T, MD   30 mL at 05/02/22 2633   metoprolol succinate (TOPROL-XL) 24 hr tablet 25 mg  25 mg Oral Daily Hadlee Burback T,  MD   25 mg at 06/26/22 0815   paliperidone (INVEGA SUSTENNA) injection 156 mg  156 mg Intramuscular Q28 days Takya Vandivier T, MD   156 mg at 06/21/22 0917   temazepam (RESTORIL) capsule 15 mg  15 mg Oral QHS Brea Coleson T, MD   15 mg at 06/25/22 2106   ziprasidone (GEODON) injection 20 mg  20 mg Intramuscular Q12H PRN Meshawn Oconnor, [name], MD   20 mg at 06/24/22 1045    Lab Results: No results found for this or any previous visit (from the past 48 hour(s)).  Blood Alcohol level:  Lab Results  Component Value Date   ETH <10 07/20/2021    Metabolic Disorder Labs: Lab Results  Component Value Date   HGBA1C 5.5 04/10/2022   MPG 111.15 04/10/2022   No results found for: "PROLACTIN" Lab Results  Component Value Date   CHOL 167 11/20/2021   TRIG 107 11/20/2021   HDL 26 (L) 11/20/2021   CHOLHDL 6.4 11/20/2021   VLDL 21 11/20/2021   LDLCALC 120 (H) 11/20/2021    Physical Findings: AIMS: Facial and Oral Movements Muscles of Facial Expression: None, normal Lips and Perioral Area: None, normal Jaw: None, normal Tongue: None, normal,Extremity Movements Upper (arms, wrists, hands, fingers): None, normal Lower (legs, knees, ankles, toes): None, normal, Trunk Movements Neck, shoulders, hips: None, normal, Overall Severity Severity of abnormal movements (highest score from questions above): None, normal Incapacitation due to abnormal movements: None, normal Patient's awareness of abnormal movements (rate only patient's report): No Awareness, Dental Status Current problems with teeth and/or dentures?: No Does patient usually wear dentures?: No  CIWA:    COWS:     Musculoskeletal: Strength & Muscle Tone: within normal limits Gait & Station: normal Patient leans: N/A  Psychiatric Specialty Exam:  Presentation  General Appearance: Appropriate for Environment; Casual; Neat; Well Groomed  Eye Contact:Fair  Speech:Slow  Speech Volume:Decreased  Handedness:Left   Mood and  Affect  Mood:Anxious  Affect:Congruent   Thought Process  Thought Processes:Goal Directed  Descriptions of Associations:Circumstantial  Orientation:Partial  Thought Content:Scattered  History of Schizophrenia/Schizoaffective disorder:Yes  Duration of Psychotic Symptoms:Greater than six months  Hallucinations:No data recorded Ideas of Reference:None  Suicidal Thoughts:No data recorded Homicidal Thoughts:No data recorded  Sensorium  Memory:Remote Good  Judgment:Fair  Insight:Fair   Executive Functions  Concentration:Fair  Attention Span:Fair  Recall:Fair  Fund of Knowledge:Fair  Language:Fair   Psychomotor Activity  Psychomotor Activity:No data recorded  Assets  Assets:Housing; Resilience; Social Support; Talents/Skills   Sleep  Sleep:No data recorded   Physical Exam: Physical Exam Vitals and nursing note reviewed.  Constitutional:      Appearance: Normal appearance.  HENT:     Head: Normocephalic and atraumatic.     Mouth/Throat:     Pharynx: Oropharynx is clear.  Eyes:     Pupils: Pupils are equal, round, and reactive to light.  Cardiovascular:     Rate and Rhythm: Normal rate and regular rhythm.  Pulmonary:     Effort: Pulmonary effort is normal.     Breath sounds: Normal breath sounds.  Abdominal:     General: Abdomen is flat.     Palpations: Abdomen is soft.  Musculoskeletal:        General: Normal range of motion.  Skin:    General: Skin is warm and dry.  Neurological:     General: No focal deficit present.     Mental Status: He is alert. Mental status is at baseline.  Psychiatric:        Speech: He is noncommunicative.    Review of Systems  Unable to perform ROS: Language   Blood pressure 103/82, pulse 99, temperature 97.9 F (36.6 C), temperature source Oral, resp. rate 18, height 5\' 8"  (1.727 m), weight 85.8 kg, SpO2 100 %. Body mass index is 28.76 kg/m.   Treatment Plan Summary: Plan no change in medication.   Continue Clozapine.  Continue usual management while we look for placement  , MD 06/26/2022, 3:38 PM

## 2022-06-27 LAB — CBC WITH DIFFERENTIAL/PLATELET
Abs Immature Granulocytes: 0.01 10*3/uL (ref 0.00–0.07)
Basophils Absolute: 0.1 10*3/uL (ref 0.0–0.1)
Basophils Relative: 1 %
Eosinophils Absolute: 0 10*3/uL (ref 0.0–0.5)
Eosinophils Relative: 1 %
HCT: 40.8 % (ref 39.0–52.0)
Hemoglobin: 13.9 g/dL (ref 13.0–17.0)
Immature Granulocytes: 0 %
Lymphocytes Relative: 34 %
Lymphs Abs: 1.6 10*3/uL (ref 0.7–4.0)
MCH: 29.1 pg (ref 26.0–34.0)
MCHC: 34.1 g/dL (ref 30.0–36.0)
MCV: 85.4 fL (ref 80.0–100.0)
Monocytes Absolute: 0.5 10*3/uL (ref 0.1–1.0)
Monocytes Relative: 10 %
Neutro Abs: 2.5 10*3/uL (ref 1.7–7.7)
Neutrophils Relative %: 54 %
Platelets: 154 10*3/uL (ref 150–400)
RBC: 4.78 MIL/uL (ref 4.22–5.81)
RDW: 13.2 % (ref 11.5–15.5)
WBC: 4.7 10*3/uL (ref 4.0–10.5)
nRBC: 0 % (ref 0.0–0.2)

## 2022-06-27 NOTE — Progress Notes (Signed)
Pharmacy - Clozapine     This patient's order has been reviewed for prescribing contraindications.   Clozapine REMS enrollment Verified: yes Current Outpatient Monitoring: weekly  Home Regimen:    Dose Adjustments This Admission:   Labs: 7/26 ANC 2500 K/uL   Plan: Continue with weekly ANC labs while inpatient   **The medication is being dispensed pursuant to the FDA REMS suspension order of 10/21/20 that allows for dispensing without a patient REMS dispense authorization (RDA).    Elliot Gurney, PharmD Clinical Pharmacist  06/27/2022 5:24 PM

## 2022-06-27 NOTE — TOC Progression Note (Signed)
Transition of Care [facility]) - Progression Note    Patient Details  Name: Adam [name] MRN: [id number] Date of Birth: [birth year]  Transition of [facility] CM/SW Contact  Margarito Liner, LCSW Phone Number: 06/27/2022, 9:48 AM  Clinical Narrative: Per Clarita Crane with [facility], no updates on placement.  Expected Discharge Plan and Services                                                 Social Determinants of Health (SDOH) Interventions    Readmission Risk Interventions     No data to display

## 2022-06-27 NOTE — Progress Notes (Signed)
Patient irritable upon this writer coming on the unit. Pt redirectable. Pt observed interacting appropriately with staff and peers on the unit. Pt compliant with medication administration per MD orders. Pt given education, support, and encouragement to be active in his treatment plan. Pt being monitored Q 15 minutes for safety per unit protocol. Pt remains safe on the unit.  

## 2022-06-27 NOTE — Plan of Care (Signed)
Patient slept till lunch time. In and out of his room. Denies SI,HI and AVH. No aggressive behaviors noted. Compliant with medications. Support and encouragement given.

## 2022-06-27 NOTE — Progress Notes (Signed)
[facility] MD Progress Note  06/27/2022 2:34 PM Adam [name]  MRN:  [id number] Subjective: No change.  Behavior about the same.  No disruption.  Calm and cooperative.  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 No pertinent surgical history. Family History: History reviewed. No pertinent family history. Family Psychiatric  History: None Social History:  Social History   Substance and Sexual Activity  Alcohol Use Not Currently     Social History   Substance and Sexual Activity  Drug Use Not Currently    Social History   Socioeconomic History   Marital status: Single    Spouse name: Not on file   Number of children: Not on file   Years of education: Not on file   Highest education level: Not on file  Occupational History   Not on file  Tobacco Use   Smoking status: Never    Passive exposure: Never   Smokeless tobacco: Never  Vaping Use   Vaping Use: Unknown  Substance and Sexual Activity   Alcohol use: Not Currently   Drug use: Not Currently   Sexual activity: Not Currently  Other Topics Concern   Not on file  Social History Narrative   Not on file   Social Determinants of Health   Financial Resource Strain: Not on file  Food Insecurity: Not on file  Transportation Needs: Not on file  Physical Activity: Not on file   Stress: Not on file  Social Connections: Not on file   Additional Social History:  Specify valuables returned:  (none)                      Sleep: Fair  Appetite:  Fair  Current Medications: Current Facility-Administered Medications  Medication Dose Route Frequency Provider Last Rate Last Admin   acetaminophen (TYLENOL) tablet 650 mg  650 mg Oral Q6H PRN Donaldo Teegarden T, MD   650 mg at 06/20/22 1351   alum & mag hydroxide-simeth (MAALOX/MYLANTA) 200-200-20 MG/5ML suspension 30 mL  30 mL Oral Q4H PRN Pearley Millington T, MD   30 mL at 04/26/22 4098   aspirin EC tablet 81 mg  81 mg Oral Daily Keeleigh Terris T, MD   81 mg at 06/27/22 1158   atorvastatin (LIPITOR) tablet 20 mg  20 mg Oral Daily Loye Vento T, MD   20 mg at 06/27/22 1159   atropine 1 % ophthalmic solution 1 drop  1 drop Sublingual QID [name], MD   1 drop at 06/27/22 1157   benztropine (COGENTIN) tablet 0.5 mg  0.5 mg Oral BID Dameian Crisman T, MD   0.5 mg at 06/27/22 1158   clonazePAM (KLONOPIN) tablet 1 mg  1 mg Oral TID PRN He, [name], MD   1 mg at 06/22/22 2115   cloZAPine (CLOZARIL) tablet 300 mg  300 mg Oral QHS Boluwatife Flight T, MD   300 mg at 06/26/22  2107   diphenhydrAMINE (BENADRYL) capsule 50 mg  50 mg Oral Q6H PRN Julina Altmann T, MD   50 mg at 06/06/22 1613   Or   diphenhydrAMINE (BENADRYL) injection 50 mg  50 mg Intramuscular Q6H PRN Tawnia Schirm T, MD       gabapentin (NEURONTIN) capsule 300 mg  300 mg Oral TID Ethyl Vila T, MD   300 mg at 06/27/22 1158   haloperidol (HALDOL) tablet 5 mg  5 mg Oral TID Elster Corbello T, MD   5 mg at 06/27/22 1158   haloperidol (HALDOL) tablet 5 mg  5 mg Oral Q6H PRN Antar Milks, [name], MD   5 mg at 06/15/22 2112   Or   haloperidol lactate (HALDOL) injection 5 mg  5 mg Intramuscular Q6H PRN Jesstin Studstill T, MD       magnesium hydroxide (MILK OF MAGNESIA) suspension 30 mL  30 mL Oral Daily PRN Rawad Bochicchio T, MD   30 mL at 05/02/22 5462   metoprolol succinate  (TOPROL-XL) 24 hr tablet 25 mg  25 mg Oral Daily Johanna Matto T, MD   25 mg at 06/27/22 1158   paliperidone (INVEGA SUSTENNA) injection 156 mg  156 mg Intramuscular Q28 days Coryn Mosso T, MD   156 mg at 06/21/22 0917   temazepam (RESTORIL) capsule 15 mg  15 mg Oral QHS Cassiel Fernandez T, MD   15 mg at 06/26/22 2107   ziprasidone (GEODON) injection 20 mg  20 mg Intramuscular Q12H PRN Shadiyah Wernli, [name], MD   20 mg at 06/24/22 1045    Lab Results: No results found for this or any previous visit (from the past 48 hour(s)).  Blood Alcohol level:  Lab Results  Component Value Date   ETH <10 07/20/2021    Metabolic Disorder Labs: Lab Results  Component Value Date   HGBA1C 5.5 04/10/2022   MPG 111.15 04/10/2022   No results found for: "PROLACTIN" Lab Results  Component Value Date   CHOL 167 11/20/2021   TRIG 107 11/20/2021   HDL 26 (L) 11/20/2021   CHOLHDL 6.4 11/20/2021   VLDL 21 11/20/2021   LDLCALC 120 (H) 11/20/2021    Physical Findings: AIMS: Facial and Oral Movements Muscles of Facial Expression: None, normal Lips and Perioral Area: None, normal Jaw: None, normal Tongue: None, normal,Extremity Movements Upper (arms, wrists, hands, fingers): None, normal Lower (legs, knees, ankles, toes): None, normal, Trunk Movements Neck, shoulders, hips: None, normal, Overall Severity Severity of abnormal movements (highest score from questions above): None, normal Incapacitation due to abnormal movements: None, normal Patient's awareness of abnormal movements (rate only patient's report): No Awareness, Dental Status Current problems with teeth and/or dentures?: No Does patient usually wear dentures?: No  CIWA:    COWS:     Musculoskeletal: Strength & Muscle Tone: within normal limits Gait & Station: normal Patient leans: N/A  Psychiatric Specialty Exam:  Presentation  General Appearance: Appropriate for Environment; Casual; Neat; Well Groomed  Eye  Contact:Fair  Speech:Slow  Speech Volume:Decreased  Handedness:Left   Mood and Affect  Mood:Anxious  Affect:Congruent   Thought Process  Thought Processes:Goal Directed  Descriptions of Associations:Circumstantial  Orientation:Partial  Thought Content:Scattered  History of Schizophrenia/Schizoaffective disorder:Yes  Duration of Psychotic Symptoms:Greater than six months  Hallucinations:No data recorded Ideas of Reference:None  Suicidal Thoughts:No data recorded Homicidal Thoughts:No data recorded  Sensorium  Memory:Remote Good  Judgment:Fair  Insight:Fair   Executive Functions  Concentration:Fair  Attention Span:Fair  Recall:Fair  Fund of Knowledge:Fair  Language:Fair   Psychomotor Activity  Psychomotor Activity:No data recorded  Assets  Assets:Housing; Resilience; Social Support; Talents/Skills   Sleep  Sleep:No data recorded   Physical Exam: Physical Exam Constitutional:      Appearance: Normal appearance.  HENT:     Head: Normocephalic and atraumatic.     Mouth/Throat:     Pharynx: Oropharynx is clear.  Eyes:     Pupils: Pupils are equal, round, and reactive to light.  Cardiovascular:     Rate and Rhythm: Normal rate and regular rhythm.  Pulmonary:     Effort: Pulmonary effort is normal.     Breath sounds: Normal breath sounds.  Abdominal:     General: Abdomen is flat.     Palpations: Abdomen is soft.  Musculoskeletal:        General: Normal range of motion.  Skin:    General: Skin is warm and dry.  Neurological:     General: No focal deficit present.     Mental Status: He is alert. Mental status is at baseline.  Psychiatric:        Attention and Perception: He is inattentive.        Mood and Affect: Affect is blunt.        Speech: He is noncommunicative.    Review of Systems  Unable to perform ROS: Language  Skin: Negative.    Blood pressure (!) 131/92, pulse (!) 105, temperature 97.9 F (36.6 C), temperature  source Oral, resp. rate 18, height 5\' 8"  (1.727 m), weight 85.8 kg, SpO2 97 %. Body mass index is 28.76 kg/m.   Treatment Plan Summary: Plan patient is absolutely fine with no active symptoms no behavior problems.  Continue to await placement  , MD 06/27/2022, 2:34 PM

## 2022-06-27 NOTE — Progress Notes (Signed)
Patient is alert and oriented times 4. Mood and affect appropriate. Patient rates pain as 0/10. He denies SI, HI, and AVH. Also denies feelings of anxiety and depression at this time. States he slept well last night. Evening meds given whole by mouth W/O difficulty. Patient was offered and at evening snack in the day room- appetite good. Patient also asked this RN to help with shaving, and the patient was shaved by this RN.  Patient was very cooperative and followed directions well. Patient remains on unit with Q15 minute checks in place.

## 2022-06-27 NOTE — Progress Notes (Signed)
Recreation Therapy Notes  Date: 06/27/2022  Time: 10:40 am    Location: Court yard   Behavioral response: N/A   Intervention Topic: Honesty   Discussion/Intervention: Patient refused to attend group.   Clinical Observations/Feedback:  Patient refused to attend group.    Verta Riedlinger LRT/CTRS        Adam [name] 06/27/2022 12:47 PM 

## 2022-06-28 NOTE — Plan of Care (Signed)
D: Pt alert and oriented. Pt does not endorse experiencing any anxiety at this time. Pt denies experiencing any pain at this time. Pt denies experiencing any SI/HI, or AVH at this time.   Pt has been irritable and agitated as the day progresses in relation to his phone.  A: Scheduled medications administered to pt, per MD orders. Support and encouragement provided. Frequent verbal contact made. Routine safety checks conducted q15 minutes.   R: No adverse drug reactions noted. Pt verbally contracts for safety at this time. Pt compliant with medications. Pt interacts poorly with others on the unit. Pt remains safe at this time. Will continue to monitor.   Problem: Activity: Goal: Risk for activity intolerance will decrease Outcome: Progressing   Problem: Coping: Goal: Coping ability will improve Outcome: Not Progressing

## 2022-06-28 NOTE — Progress Notes (Signed)
Patient irritable upon this writer coming on the unit. Pt redirectable. Pt observed interacting appropriately with staff and peers on the unit. Pt compliant with medication administration per MD orders. Pt given education, support, and encouragement to be active in his treatment plan. Pt being monitored Q 15 minutes for safety per unit protocol. Pt remains safe on the unit.

## 2022-06-28 NOTE — Progress Notes (Signed)
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 No pertinent surgical history. Family History: History reviewed. No pertinent family history. Family Psychiatric  History: none Social History:  Social History   Substance and Sexual Activity  Alcohol Use Not Currently     Social History   Substance and Sexual Activity  Drug Use Not Currently    Social History   Socioeconomic History   Marital status: Single    Spouse name: Not on file   Number of children: Not on file   Years of education: Not on file   Highest education level: Not on file  Occupational History   Not on file  Tobacco Use   Smoking status: Never    Passive exposure: Never   Smokeless tobacco: Never  Vaping Use   Vaping Use: Unknown  Substance and Sexual Activity   Alcohol use: Not Currently   Drug use: Not Currently   Sexual activity: Not Currently  Other Topics Concern   Not on file  Social History Narrative   Not on file   Social Determinants of Health   Financial Resource Strain: Not on file  Food Insecurity: Not on file  Transportation Needs: Not on file  Physical Activity: Not on file  Stress: Not on file  Social Connections: Not on file   Additional Social History:  Specify valuables  returned:  (none)                      Sleep: Fair  Appetite:  Fair  Current Medications: Current Facility-Administered Medications  Medication Dose Route Frequency Provider Last Rate Last Admin   acetaminophen (TYLENOL) tablet 650 mg  650 mg Oral Q6H PRN Janiyah Beery T, MD   650 mg at 06/20/22 1351   alum & mag hydroxide-simeth (MAALOX/MYLANTA) 200-200-20 MG/5ML suspension 30 mL  30 mL Oral Q4H PRN Rosine Solecki T, MD   30 mL at 04/26/22 1884   aspirin EC tablet 81 mg  81 mg Oral Daily Naava Janeway, [name], MD   81 mg at 06/28/22 1660   atorvastatin (LIPITOR) tablet 20 mg  20 mg Oral Daily Maurisio Ruddy T, MD   20 mg at 06/28/22 0952   atropine 1 % ophthalmic solution 1 drop  1 drop Sublingual QID [name], MD   1 drop at 06/28/22 0952   benztropine (COGENTIN) tablet 0.5 mg  0.5 mg Oral BID Kae Lauman T, MD   0.5 mg at 06/28/22 6301   clonazePAM (KLONOPIN) tablet 1 mg  1 mg Oral TID PRN He, [name], MD   1 mg at 06/28/22 0953   cloZAPine (CLOZARIL) tablet 300 mg  300 mg Oral QHS Juvia Aerts T, MD   300 mg at 06/27/22 2105   diphenhydrAMINE (BENADRYL) capsule 50 mg  50 mg Oral Q6H PRN Kambrey Hagger  T, MD   50 mg at 06/06/22 1613   Or   diphenhydrAMINE (BENADRYL) injection 50 mg  50 mg Intramuscular Q6H PRN Asti Mackley, [name], MD       gabapentin (NEURONTIN) capsule 300 mg  300 mg Oral TID Yaritzel Stange, [name], MD   300 mg at 06/28/22 2119   haloperidol (HALDOL) tablet 5 mg  5 mg Oral TID Kamorie Aldous, [name], MD   5 mg at 06/28/22 4174   haloperidol (HALDOL) tablet 5 mg  5 mg Oral Q6H PRN Briggitte Boline, [name], MD   5 mg at 06/15/22 2112   Or   haloperidol lactate (HALDOL) injection 5 mg  5 mg Intramuscular Q6H PRN Leanor Voris, [name], MD       magnesium hydroxide (MILK OF MAGNESIA) suspension 30 mL  30 mL Oral Daily PRN Ayaka Andes, [name], MD   30 mL at 05/02/22 0814   metoprolol succinate (TOPROL-XL) 24 hr tablet 25 mg  25 mg Oral Daily Franshesca Chipman T, MD   25 mg at 06/28/22 1007   paliperidone  (INVEGA SUSTENNA) injection 156 mg  156 mg Intramuscular Q28 days Rishav Rockefeller T, MD   156 mg at 06/21/22 0917   temazepam (RESTORIL) capsule 15 mg  15 mg Oral QHS Radiah Lubinski T, MD   15 mg at 06/27/22 2105   ziprasidone (GEODON) injection 20 mg  20 mg Intramuscular Q12H PRN Chrisopher Pustejovsky, [name], MD   20 mg at 06/24/22 1045    Lab Results:  Results for orders placed or performed during the hospital encounter of 03/19/22 (from the past 48 hour(s))  CBC with Differential/Platelet     Status: None   Collection Time: 06/27/22  5:04 PM  Result Value Ref Range   WBC 4.7 4.0 - 10.5 K/uL   RBC 4.78 4.22 - 5.81 MIL/uL   Hemoglobin 13.9 13.0 - 17.0 g/dL   HCT 48.1 85.6 - 31.4 %   MCV 85.4 80.0 - 100.0 fL   MCH 29.1 26.0 - 34.0 pg   MCHC 34.1 30.0 - 36.0 g/dL   RDW 97.0 26.3 - 78.5 %   Platelets 154 150 - 400 K/uL   nRBC 0.0 0.0 - 0.2 %   Neutrophils Relative % 54 %   Neutro Abs 2.5 1.7 - 7.7 K/uL   Lymphocytes Relative 34 %   Lymphs Abs 1.6 0.7 - 4.0 K/uL   Monocytes Relative 10 %   Monocytes Absolute 0.5 0.1 - 1.0 K/uL   Eosinophils Relative 1 %   Eosinophils Absolute 0.0 0.0 - 0.5 K/uL   Basophils Relative 1 %   Basophils Absolute 0.1 0.0 - 0.1 K/uL   Immature Granulocytes 0 %   Abs Immature Granulocytes 0.01 0.00 - 0.07 K/uL    Comment: Performed at [facility] [address] [place], [place] [address]    Blood Alcohol level:  Lab Results  Component Value Date   [facility] <10 07/20/2021    Metabolic Disorder Labs: Lab Results  Component Value Date   HGBA1C 5.5 04/10/2022   MPG 111.15 04/10/2022   No results found for: "PROLACTIN" Lab Results  Component Value Date   CHOL 167 11/20/2021   TRIG 107 11/20/2021   HDL 26 (L) 11/20/2021   CHOLHDL 6.4 11/20/2021   VLDL 21 11/20/2021   LDLCALC 120 (H) 11/20/2021    Physical Findings: AIMS: Facial and Oral Movements Muscles of Facial Expression: None, normal Lips and Perioral Area: None, normal Jaw: None,  normal Tongue:  None, normal,Extremity Movements Upper (arms, wrists, hands, fingers): None, normal Lower (legs, knees, ankles, toes): None, normal, Trunk Movements Neck, shoulders, hips: None, normal, Overall Severity Severity of abnormal movements (highest score from questions above): None, normal Incapacitation due to abnormal movements: None, normal Patient's awareness of abnormal movements (rate only patient's report): No Awareness, Dental Status Current problems with teeth and/or dentures?: No Does patient usually wear dentures?: No  CIWA:    COWS:     Musculoskeletal: Strength & Muscle Tone: within normal limits Gait & Station: normal Patient leans: N/A  Psychiatric Specialty Exam:  Presentation  General Appearance: Appropriate for Environment; Casual; Neat; Well Groomed  Eye Contact:Fair  Speech:Slow  Speech Volume:Decreased  Handedness:Left   Mood and Affect  Mood:Anxious  Affect:Congruent   Thought Process  Thought Processes:Goal Directed  Descriptions of Associations:Circumstantial  Orientation:Partial  Thought Content:Scattered  History of Schizophrenia/Schizoaffective disorder:Yes  Duration of Psychotic Symptoms:Greater than six months  Hallucinations:No data recorded Ideas of Reference:None  Suicidal Thoughts:No data recorded Homicidal Thoughts:No data recorded  Sensorium  Memory:Remote Good  Judgment:Fair  Insight:Fair   Executive Functions  Concentration:Fair  Attention Span:Fair  Recall:Fair  Fund of Knowledge:Fair  Language:Fair   Psychomotor Activity  Psychomotor Activity:No data recorded  Assets  Assets:Housing; Resilience; Social Support; Talents/Skills   Sleep  Sleep:No data recorded   Physical Exam: Physical Exam Vitals and nursing note reviewed.  Constitutional:      Appearance: Normal appearance.  HENT:     Head: Normocephalic and atraumatic.     Mouth/Throat:     Pharynx: Oropharynx is clear.   Eyes:     Pupils: Pupils are equal, round, and reactive to light.  Cardiovascular:     Rate and Rhythm: Normal rate and regular rhythm.  Pulmonary:     Effort: Pulmonary effort is normal.     Breath sounds: Normal breath sounds.  Abdominal:     General: Abdomen is flat.     Palpations: Abdomen is soft.  Musculoskeletal:        General: Normal range of motion.  Skin:    General: Skin is warm and dry.  Neurological:     General: No focal deficit present.     Mental Status: He is alert. Mental status is at baseline.  Psychiatric:        Attention and Perception: He is inattentive.        Mood and Affect: Mood normal.        Speech: He is noncommunicative.        Behavior: Behavior is agitated.    Review of Systems  Unable to perform ROS: Language   Blood pressure (!) 137/91, pulse 88, temperature 97.8 F (36.6 C), temperature source Oral, resp. rate 18, height 5\' 8"  (1.727 m), weight 85.8 kg, SpO2 99 %. Body mass index is 28.76 kg/m.   Treatment Plan Summary: Plan continue to seek placement  , MD 06/28/2022, 1:31 PM

## 2022-06-28 NOTE — Progress Notes (Signed)
Recreation Therapy Notes   Date: 06/28/2022   Time: 10:25 am     Location: Court yard    Behavioral response: N/A   Intervention Topic: Wellness    Discussion/Intervention: Patient refused to attend group.    Clinical Observations/Feedback:  Patient refused to attend group.    Aki Abalos LRT/CTRS          Adam [name] 06/28/2022 11:32 AM 

## 2022-06-29 NOTE — Progress Notes (Signed)
D: Patient alert and oriented. Patient denies pain. Patient denies anxiety and depression. Patient denies SI/HI/AVH. Patient irritable during shift. Patient frequently came up to the nurses station with phone because it was broken. When staff took broken phone patient became agitated.  Patient required an escort back to the back hall due to charging at a staff member at around 1420.  Patient calmed down in back hall dayroom. Patient has been pleasant after going back to bedroom and dayroom.   A: Scheduled medications administered to patient, per MD orders.  Support and encouragement provided to patient.  Q15 minute safety checks maintained.   R: Patient compliant with medication administration and treatment plan. No adverse drug reactions noted. Patient remains safe on the unit at this time.

## 2022-06-29 NOTE — Plan of Care (Signed)
  Problem: Education: Goal: Ability to state activities that reduce stress will improve Outcome: Progressing   Problem: Coping: Goal: Ability to identify and develop effective coping behavior will improve Outcome: Progressing   Problem: Self-Concept: Goal: Level of anxiety will decrease Outcome: Progressing   Problem: Education: Goal: Knowledge of General Education information will improve Description: Including pain rating scale, medication(s)/side effects and non-pharmacologic comfort measures Outcome: Progressing   Problem: Clinical Measurements: Goal: Ability to maintain clinical measurements within normal limits will improve Outcome: Progressing

## 2022-06-29 NOTE — Progress Notes (Signed)
Recreation Therapy Notes    Date: 06/29/2022   Time: 10:50 am     Location: Court yard    Behavioral response: N/A   Intervention Topic: Social Skills     Discussion/Intervention: Patient refused to attend group.    Clinical Observations/Feedback:  Patient refused to attend group.    Chetara Kropp LRT/CTRS        Danel Requena 06/29/2022 11:35 AM

## 2022-06-29 NOTE — Progress Notes (Signed)
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 No pertinent surgical history. Family History: History reviewed. No pertinent family history. Family Psychiatric  History: None Social History:  Social History   Substance and Sexual Activity  Alcohol Use Not Currently     Social History   Substance and Sexual Activity  Drug Use Not Currently    Social History   Socioeconomic History   Marital status: Single    Spouse name: Not on file   Number of children: Not on file   Years of education: Not on file   Highest education level: Not on file  Occupational History   Not on file  Tobacco Use   Smoking status: Never    Passive exposure: Never   Smokeless tobacco: Never  Vaping Use   Vaping Use: Unknown  Substance and Sexual Activity   Alcohol use: Not Currently   Drug use: Not Currently   Sexual activity: Not Currently  Other Topics Concern   Not on file  Social History Narrative   Not on file   Social Determinants of Health   Financial Resource Strain: Not on file  Food Insecurity: Not on file  Transportation Needs: Not on file  Physical Activity: Not on file   Stress: Not on file  Social Connections: Not on file   Additional Social History:  Specify valuables returned:  (none)                      Sleep: Fair  Appetite:  Fair  Current Medications: Current Facility-Administered Medications  Medication Dose Route Frequency Provider Last Rate Last Admin   acetaminophen (TYLENOL) tablet 650 mg  650 mg Oral Q6H PRN Ahsha Hinsley T, MD   650 mg at 06/20/22 1351   alum & mag hydroxide-simeth (MAALOX/MYLANTA) 200-200-20 MG/5ML suspension 30 mL  30 mL Oral Q4H PRN Tonnie Stillman T, MD   30 mL at 04/26/22 4944   aspirin EC tablet 81 mg  81 mg Oral Daily Fannie Alomar, [name], MD   81 mg at 06/29/22 0820   atorvastatin (LIPITOR) tablet 20 mg  20 mg Oral Daily Jovane Foutz T, MD   20 mg at 06/29/22 0820   atropine 1 % ophthalmic solution 1 drop  1 drop Sublingual QID [name], MD   1 drop at 06/29/22 1202   benztropine (COGENTIN) tablet 0.5 mg  0.5 mg Oral BID Mirha Brucato T, MD   0.5 mg at 06/29/22 0820   clonazePAM (KLONOPIN) tablet 1 mg  1 mg Oral TID PRN He, [name], MD   1 mg at 06/28/22 0953   cloZAPine (CLOZARIL) tablet 300 mg  300 mg Oral QHS Jeanett Antonopoulos T, MD   300 mg at 06/28/22  2115   diphenhydrAMINE (BENADRYL) capsule 50 mg  50 mg Oral Q6H PRN Delphin Funes, [name], MD   50 mg at 06/28/22 1337   Or   diphenhydrAMINE (BENADRYL) injection 50 mg  50 mg Intramuscular Q6H PRN Taygen Acklin, [name], MD       gabapentin (NEURONTIN) capsule 300 mg  300 mg Oral TID Erionna Strum, [name], MD   300 mg at 06/29/22 1202   haloperidol (HALDOL) tablet 5 mg  5 mg Oral TID Stephan Draughn, [name], MD   5 mg at 06/29/22 1202   haloperidol (HALDOL) tablet 5 mg  5 mg Oral Q6H PRN Gurkirat Basher, [name], MD   5 mg at 06/15/22 2112   Or   haloperidol lactate (HALDOL) injection 5 mg  5 mg Intramuscular Q6H PRN Iziah Cates, [name], MD       magnesium hydroxide (MILK OF MAGNESIA) suspension 30 mL  30 mL Oral Daily PRN Judah Chevere T, MD   30 mL at 05/02/22 4098   metoprolol succinate  (TOPROL-XL) 24 hr tablet 25 mg  25 mg Oral Daily Young Brim T, MD   25 mg at 06/29/22 0820   paliperidone (INVEGA SUSTENNA) injection 156 mg  156 mg Intramuscular Q28 days Elloise Roark T, MD   156 mg at 06/21/22 0917   temazepam (RESTORIL) capsule 15 mg  15 mg Oral QHS Newton Frutiger T, MD   15 mg at 06/28/22 2115   ziprasidone (GEODON) injection 20 mg  20 mg Intramuscular Q12H PRN Sundus Pete, [name], MD   20 mg at 06/24/22 1045    Lab Results:  Results for orders placed or performed during the hospital encounter of 03/19/22 (from the past 48 hour(s))  CBC with Differential/Platelet     Status: None   Collection Time: 06/27/22  5:04 PM  Result Value Ref Range   WBC 4.7 4.0 - 10.5 K/uL   RBC 4.78 4.22 - 5.81 MIL/uL   Hemoglobin 13.9 13.0 - 17.0 g/dL   HCT 11.9 14.7 - 82.9 %   MCV 85.4 80.0 - 100.0 fL   MCH 29.1 26.0 - 34.0 pg   MCHC 34.1 30.0 - 36.0 g/dL   RDW 56.2 13.0 - 86.5 %   Platelets 154 150 - 400 K/uL   nRBC 0.0 0.0 - 0.2 %   Neutrophils Relative % 54 %   Neutro Abs 2.5 1.7 - 7.7 K/uL   Lymphocytes Relative 34 %   Lymphs Abs 1.6 0.7 - 4.0 K/uL   Monocytes Relative 10 %   Monocytes Absolute 0.5 0.1 - 1.0 K/uL   Eosinophils Relative 1 %   Eosinophils Absolute 0.0 0.0 - 0.5 K/uL   Basophils Relative 1 %   Basophils Absolute 0.1 0.0 - 0.1 K/uL   Immature Granulocytes 0 %   Abs Immature Granulocytes 0.01 0.00 - 0.07 K/uL    Comment: Performed at [facility], [address] [place], [place] [address]    Blood Alcohol level:  Lab Results  Component Value Date   [facility] <10 07/20/2021    Metabolic Disorder Labs: Lab Results  Component Value Date   HGBA1C 5.5 04/10/2022   MPG 111.15 04/10/2022   No results found for: "PROLACTIN" Lab Results  Component Value Date   CHOL 167 11/20/2021   TRIG 107 11/20/2021   HDL 26 (L) 11/20/2021   CHOLHDL 6.4 11/20/2021   VLDL 21 11/20/2021   LDLCALC 120 (H) 11/20/2021    Physical Findings: AIMS: Facial and Oral  Movements  Muscles of Facial Expression: None, normal Lips and Perioral Area: None, normal Jaw: None, normal Tongue: None, normal,Extremity Movements Upper (arms, wrists, hands, fingers): None, normal Lower (legs, knees, ankles, toes): None, normal, Trunk Movements Neck, shoulders, hips: None, normal, Overall Severity Severity of abnormal movements (highest score from questions above): None, normal Incapacitation due to abnormal movements: None, normal Patient's awareness of abnormal movements (rate only patient's report): No Awareness, Dental Status Current problems with teeth and/or dentures?: No Does patient usually wear dentures?: No  CIWA:    COWS:     Musculoskeletal: Strength & Muscle Tone: within normal limits Gait & Station: normal Patient leans: N/A  Psychiatric Specialty Exam:  Presentation  General Appearance: Appropriate for Environment; Casual; Neat; Well Groomed  Eye Contact:Fair  Speech:Slow  Speech Volume:Decreased  Handedness:Left   Mood and Affect  Mood:Anxious  Affect:Congruent   Thought Process  Thought Processes:Goal Directed  Descriptions of Associations:Circumstantial  Orientation:Partial  Thought Content:Scattered  History of Schizophrenia/Schizoaffective disorder:Yes  Duration of Psychotic Symptoms:Greater than six months  Hallucinations:No data recorded Ideas of Reference:None  Suicidal Thoughts:No data recorded Homicidal Thoughts:No data recorded  Sensorium  Memory:Remote Good  Judgment:Fair  Insight:Fair   Executive Functions  Concentration:Fair  Attention Span:Fair  Recall:Fair  Fund of Knowledge:Fair  Language:Fair   Psychomotor Activity  Psychomotor Activity:No data recorded  Assets  Assets:Housing; Resilience; Social Support; Talents/Skills   Sleep  Sleep:No data recorded   Physical Exam: Physical Exam Vitals and nursing note reviewed.  Constitutional:      Appearance: Normal appearance.   HENT:     Head: Normocephalic and atraumatic.     Mouth/Throat:     Pharynx: Oropharynx is clear.  Eyes:     Pupils: Pupils are equal, round, and reactive to light.  Cardiovascular:     Rate and Rhythm: Normal rate and regular rhythm.  Pulmonary:     Effort: Pulmonary effort is normal.     Breath sounds: Normal breath sounds.  Abdominal:     General: Abdomen is flat.     Palpations: Abdomen is soft.  Musculoskeletal:        General: Normal range of motion.  Skin:    General: Skin is warm and dry.  Neurological:     General: No focal deficit present.     Mental Status: He is alert. Mental status is at baseline.  Psychiatric:        Attention and Perception: He is inattentive.        Mood and Affect: Affect is blunt.        Speech: He is noncommunicative.    Review of Systems  Constitutional: Negative.   HENT: Negative.    Eyes: Negative.   Respiratory: Negative.    Cardiovascular: Negative.   Gastrointestinal: Negative.   Musculoskeletal: Negative.   Skin: Negative.   Neurological: Negative.    Blood pressure (!) 137/91, pulse 88, temperature 97.8 F (36.6 C), temperature source Oral, resp. rate 18, height 5\' 8"  (1.727 m), weight 85.8 kg, SpO2 99 %. Body mass index is 28.76 kg/m.   Treatment Plan Summary: Medication management and Plan no change to medication or treatment.  Spent time offering some encouragement for what it is worth.  We both know that there is no plan on the horizon for discharge though we continue to look for 1.  , MD 06/29/2022, 12:53 PM

## 2022-06-30 NOTE — Group Note (Signed)
LCSW Group Therapy Note  Group Date: 06/30/2022 Start Time: 1500 End Time: 1510   Type of Therapy and Topic:  Group Therapy - How To Cope with Nervousness about Discharge   Participation Level:  Did Not Attend   Description of Group This process group involved identification of patients' feelings about discharge. Some of them are scheduled to be discharged soon, while others are new admissions, but each of them was asked to share thoughts and feelings surrounding discharge from the hospital. One common theme was that they are excited at the prospect of going home, while another was that many of them are apprehensive about sharing why they were hospitalized. Patients were given the opportunity to discuss these feelings with their peers in preparation for discharge.  Therapeutic Goals  Patient will identify their overall feelings about pending discharge. Patient will think about how they might proactively address issues that they believe will once again arise once they get home (i.e. with parents). Patients will participate in discussion about having hope for change.   Summary of Patient Progress:   X    Therapeutic Modalities Cognitive Behavioral Therapy   Niels Cranshaw A [place], LCSWA 06/30/2022  3:36 PM

## 2022-06-30 NOTE — Progress Notes (Signed)
Cooperative with treatment on shift, denies anxiety and denies depression. She denies SI,HI &AVH.

## 2022-06-30 NOTE — Progress Notes (Signed)
[facility] MD Progress Note  06/30/2022 12:31 PM Adam [name]  MRN:  [id number] Subjective: No changes with Adam [name].  He has been compliant with his medications.  He is sleeping and eating well.  No issues.  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 No pertinent surgical history. Family History: History reviewed. No pertinent family history.  Social History:  Social History   Substance and Sexual Activity  Alcohol Use Not Currently     Social History   Substance and Sexual Activity  Drug Use Not Currently    Social History   Socioeconomic History   Marital status: Single    Spouse name: Not on file   Number of children: Not on file   Years of education: Not on file   Highest education level: Not on file  Occupational History   Not on file  Tobacco Use   Smoking status: Never    Passive exposure: Never   Smokeless tobacco: Never  Vaping Use   Vaping Use: Unknown  Substance and Sexual Activity   Alcohol use: Not Currently   Drug use: Not Currently   Sexual activity: Not Currently  Other Topics Concern   Not on file  Social History Narrative   Not on file   Social Determinants of Health   Financial Resource Strain: Not on file  Food Insecurity: Not on file  Transportation Needs: Not on file  Physical  Activity: Not on file  Stress: Not on file  Social Connections: Not on file   Additional Social History:  Specify valuables returned:  (none)                      Sleep: Good  Appetite:  Good  Current Medications: Current Facility-Administered Medications  Medication Dose Route Frequency Provider Last Rate Last Admin   acetaminophen (TYLENOL) tablet 650 mg  650 mg Oral Q6H PRN [name], [name], MD   650 mg at 06/20/22 1351   alum & mag hydroxide-simeth (MAALOX/MYLANTA) 200-200-20 MG/5ML suspension 30 mL  30 mL Oral Q4H PRN [name], [name], MD   30 mL at 04/26/22 6761   aspirin EC tablet 81 mg  81 mg Oral Daily [name], [name], MD   81 mg at 06/30/22 1137   atorvastatin (LIPITOR) tablet 20 mg  20 mg Oral Daily [name], [name], MD   20 mg at 06/30/22 1137   atropine 1 % ophthalmic solution 1 drop  1 drop Sublingual QID [name], MD   1 drop at 06/30/22 1140   benztropine (COGENTIN) tablet 0.5 mg  0.5 mg Oral BID [name], [name], MD   0.5 mg at 06/30/22 1138   clonazePAM (KLONOPIN) tablet 1 mg  1 mg Oral TID PRN He, [name], MD   1 mg at 06/28/22 0953   cloZAPine (CLOZARIL) tablet 300 mg  300 mg Oral QHS [name], John  T, MD   300 mg at 06/29/22 2100   diphenhydrAMINE (BENADRYL) capsule 50 mg  50 mg Oral Q6H PRN [name], [name], MD   50 mg at 06/28/22 1337   Or   diphenhydrAMINE (BENADRYL) injection 50 mg  50 mg Intramuscular Q6H PRN [name], [name], MD       gabapentin (NEURONTIN) capsule 300 mg  300 mg Oral TID [name], [name], MD   300 mg at 06/30/22 1137   haloperidol (HALDOL) tablet 5 mg  5 mg Oral TID [name], [name], MD   5 mg at 06/30/22 1137   haloperidol (HALDOL) tablet 5 mg  5 mg Oral Q6H PRN [name], [name], MD   5 mg at 06/15/22 2112   Or   haloperidol lactate (HALDOL) injection 5 mg  5 mg Intramuscular Q6H PRN [name], [name], MD       magnesium hydroxide (MILK OF MAGNESIA) suspension 30 mL  30 mL Oral Daily PRN [name], [name], MD   30 mL at 05/02/22 9381    metoprolol succinate (TOPROL-XL) 24 hr tablet 25 mg  25 mg Oral Daily [name], [name], MD   25 mg at 06/30/22 1137   paliperidone (INVEGA SUSTENNA) injection 156 mg  156 mg Intramuscular Q28 days [name], [name], MD   156 mg at 06/21/22 0917   temazepam (RESTORIL) capsule 15 mg  15 mg Oral QHS [name], [name], MD   15 mg at 06/29/22 2059   ziprasidone (GEODON) injection 20 mg  20 mg Intramuscular Q12H PRN [name], [name], MD   20 mg at 06/24/22 1045    Lab Results: No results found for this or any previous visit (from the past 48 hour(s)).  Blood Alcohol level:  Lab Results  Component Value Date   ETH <10 07/20/2021    Metabolic Disorder Labs: Lab Results  Component Value Date   HGBA1C 5.5 04/10/2022   MPG 111.15 04/10/2022   No results found for: "PROLACTIN" Lab Results  Component Value Date   CHOL 167 11/20/2021   TRIG 107 11/20/2021   HDL 26 (L) 11/20/2021   CHOLHDL 6.4 11/20/2021   VLDL 21 11/20/2021   LDLCALC 120 (H) 11/20/2021    Physical Findings: AIMS: Facial and Oral Movements Muscles of Facial Expression: None, normal Lips and Perioral Area: None, normal Jaw: None, normal Tongue: None, normal,Extremity Movements Upper (arms, wrists, hands, fingers): None, normal Lower (legs, knees, ankles, toes): None, normal, Trunk Movements Neck, shoulders, hips: None, normal, Overall Severity Severity of abnormal movements (highest score from questions above): None, normal Incapacitation due to abnormal movements: None, normal Patient's awareness of abnormal movements (rate only patient's report): No Awareness, Dental Status Current problems with teeth and/or dentures?: No Does patient usually wear dentures?: No  CIWA:    COWS:     Musculoskeletal: Strength & Muscle Tone: within normal limits Gait & Station: normal Patient leans: N/A  Psychiatric Specialty Exam:  Presentation  General Appearance: Appropriate for Environment; Casual; Neat; Well Groomed  Eye  Contact:Fair  Speech:Slow  Speech Volume:Decreased  Handedness:Left   Mood and Affect  Mood:Anxious  Affect:Congruent   Thought Process  Thought Processes:Goal Directed  Descriptions of Associations:Circumstantial  Orientation:Partial  Thought Content:Scattered  History of Schizophrenia/Schizoaffective disorder:Yes  Duration of Psychotic Symptoms:Greater than six months  Hallucinations:No data recorded Ideas of Reference:None  Suicidal Thoughts:No data recorded Homicidal Thoughts:No data recorded  Sensorium  Memory:Remote Good  Judgment:Fair  Insight:Fair   Executive Functions  Concentration:Fair  Attention  Span:Fair  Recall:Fair  Fund of Knowledge:Fair  Language:Fair   Psychomotor Activity  Psychomotor Activity:No data recorded  Assets  Assets:Housing; Resilience; Social Support; Talents/Skills   Sleep  Sleep:No data recorded   Physical Exam: Physical Exam ROS Blood pressure (!) 137/91, pulse 88, temperature 97.8 F (36.6 C), temperature source Oral, resp. rate 18, height 5\' 8"  (1.727 m), weight 85.8 kg, SpO2 99 %. Body mass index is 28.76 kg/m.   Treatment Plan Summary: Daily contact with patient to assess and evaluate symptoms and progress in treatment, Medication management, and Plan continue current medications.  , DO 06/30/2022, 12:31 PM

## 2022-06-30 NOTE — Progress Notes (Addendum)
0900-Pt resting/sleep in bed. Will given meds when pt is awake. 1137 am- Pt compliant with medications this morning, pt is irritable and continues to pace the unit. Denies pain, denies SI/HI, safety rounds maintained. 1245pm - Pt has loud speech and yelling while pacing the halls.  Problem: Education: Goal: Ability to state activities that reduce stress will improve Outcome: Progressing   Problem: Coping: Goal: Ability to identify and develop effective coping behavior will improve Outcome: Progressing   Problem: Self-Concept: Goal: Ability to identify factors that promote anxiety will improve Outcome: Progressing

## 2022-07-01 NOTE — Plan of Care (Signed)
Met with pt in room. Pt is adherent with scheduled medication. Pt is at times agitated,

## 2022-07-01 NOTE — Progress Notes (Signed)
The patient has been cooperative, he has been fixated on his phone, he has been medication compliant and no new behavioral issues to report on shift at this time.

## 2022-07-01 NOTE — Progress Notes (Signed)
[name] MD Progress Note  07/01/2022 2:37 PM Adam [name]  MRN:  [id number] Subjective: Adam [name] has been doing fine.  No changes.  Got a little agitated with me when I spoke to him.  He is usually pretty pleasant and cooperative.  No issues and no complaints. He has been compliant with his medications.  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 No pertinent surgical history. Family History: History reviewed. No pertinent family history.  Social History:  Social History   Substance and Sexual Activity  Alcohol Use Not Currently     Social History   Substance and Sexual Activity  Drug Use Not Currently    Social History   Socioeconomic History   Marital status: Single    Spouse name: Not on file   Number of children: Not on file   Years of education: Not on file   Highest education level: Not on file  Occupational History   Not on file  Tobacco Use   Smoking status: Never    Passive exposure: Never   Smokeless tobacco: Never  Vaping Use   Vaping Use: Unknown  Substance and Sexual Activity   Alcohol use: Not Currently   Drug use: Not Currently   Sexual activity: Not Currently  Other Topics Concern   Not on file  Social History Narrative   Not on file   Social Determinants of Health   Financial Resource  Strain: Not on file  Food Insecurity: Not on file  Transportation Needs: Not on file  Physical Activity: Not on file  Stress: Not on file  Social Connections: Not on file   Additional Social History:  Specify valuables returned:  (none)                      Sleep: Good  Appetite:  Good  Current Medications: Current Facility-Administered Medications  Medication Dose Route Frequency Provider Last Rate Last Admin   acetaminophen (TYLENOL) tablet 650 mg  650 mg Oral Q6H PRN [name], [name], MD   650 mg at 06/20/22 1351   alum & mag hydroxide-simeth (MAALOX/MYLANTA) 200-200-20 MG/5ML suspension 30 mL  30 mL Oral Q4H PRN [name], [name], MD   30 mL at 04/26/22 4235   aspirin EC tablet 81 mg  81 mg Oral Daily [name], [name], MD   81 mg at 07/01/22 0840   atorvastatin (LIPITOR) tablet 20 mg  20 mg Oral Daily [name], [name], MD   20 mg at 07/01/22 0840   atropine 1 % ophthalmic solution 1 drop  1 drop Sublingual QID [name], MD   1 drop at 07/01/22 1246   benztropine (COGENTIN) tablet 0.5 mg  0.5 mg Oral BID [name], [name], MD   0.5 mg at 07/01/22 0840   clonazePAM (KLONOPIN) tablet 1 mg  1 mg Oral TID PRN He, [name], MD  1 mg at 06/28/22 0953   cloZAPine (CLOZARIL) tablet 300 mg  300 mg Oral QHS [name], [name], MD   300 mg at 06/30/22 2109   diphenhydrAMINE (BENADRYL) capsule 50 mg  50 mg Oral Q6H PRN [name], [name], MD   50 mg at 06/28/22 1337   Or   diphenhydrAMINE (BENADRYL) injection 50 mg  50 mg Intramuscular Q6H PRN [name], [name], MD       gabapentin (NEURONTIN) capsule 300 mg  300 mg Oral TID [name], [name], MD   300 mg at 07/01/22 1246   haloperidol (HALDOL) tablet 5 mg  5 mg Oral TID [name], [name], MD   5 mg at 07/01/22 1246   haloperidol (HALDOL) tablet 5 mg  5 mg Oral Q6H PRN [name], [name], MD   5 mg at 06/15/22 2112   Or   haloperidol lactate (HALDOL) injection 5 mg  5 mg Intramuscular Q6H PRN [name], [name], MD       magnesium hydroxide (MILK OF  MAGNESIA) suspension 30 mL  30 mL Oral Daily PRN [name], [name], MD   30 mL at 05/02/22 [id number]   metoprolol succinate (TOPROL-XL) 24 hr tablet 25 mg  25 mg Oral Daily [name], [name], MD   25 mg at 07/01/22 0841   paliperidone (INVEGA SUSTENNA) injection 156 mg  156 mg Intramuscular Q28 days [name], [name], MD   156 mg at 06/21/22 0917   temazepam (RESTORIL) capsule 15 mg  15 mg Oral QHS [name], [name], MD   15 mg at 06/30/22 2109   ziprasidone (GEODON) injection 20 mg  20 mg Intramuscular Q12H PRN [name], [name], MD   20 mg at 06/24/22 1045    Lab Results: No results found for this or any previous visit (from the past 48 hour(s)).  Blood Alcohol level:  Lab Results  Component Value Date   ETH <10 [id number]    Metabolic Disorder Labs: Lab Results  Component Value Date   HGBA1C 5.5 04/10/2022   MPG 111.15 04/10/2022   No results found for: "PROLACTIN" Lab Results  Component Value Date   CHOL 167 11/20/2021   TRIG 107 11/20/2021   HDL 26 (L) 11/20/2021   CHOLHDL 6.4 11/20/2021   VLDL 21 11/20/2021   LDLCALC 120 (H) 11/20/2021    Physical Findings: AIMS: Facial and Oral Movements Muscles of Facial Expression: None, normal Lips and Perioral Area: None, normal Jaw: None, normal Tongue: None, normal,Extremity Movements Upper (arms, wrists, hands, fingers): None, normal Lower (legs, knees, ankles, toes): None, normal, Trunk Movements Neck, shoulders, hips: None, normal, Overall Severity Severity of abnormal movements (highest score from questions above): None, normal Incapacitation due to abnormal movements: None, normal Patient's awareness of abnormal movements (rate only patient's report): No Awareness, Dental Status Current problems with teeth and/or dentures?: No Does patient usually wear dentures?: No  CIWA:    COWS:     Musculoskeletal: Strength & Muscle Tone: within normal limits Gait & Station: normal Patient leans: N/A  Psychiatric Specialty  Exam:  Presentation  General Appearance: Appropriate for Environment; Casual; Neat; Well Groomed  Eye Contact:Fair  Speech:Slow  Speech Volume:Decreased  Handedness:Left   Mood and Affect  Mood:Anxious  Affect:Congruent   Thought Process  Thought Processes:Goal Directed  Descriptions of Associations:Circumstantial  Orientation:Partial  Thought Content:Scattered  History of Schizophrenia/Schizoaffective disorder:Yes  Duration of Psychotic Symptoms:Greater than six months  Hallucinations:No data recorded Ideas of Reference:None  Suicidal Thoughts:No data recorded Homicidal Thoughts:No  data recorded  Sensorium  Memory:Remote Good  Judgment:Fair  Insight:Fair   Executive Functions  Concentration:Fair  Attention Span:Fair  Recall:Fair  Fund of Knowledge:Fair  Language:Fair   Psychomotor Activity  Psychomotor Activity:No data recorded  Assets  Assets:Housing; Resilience; Social Support; Talents/Skills   Sleep  Sleep:No data recorded   Physical Exam: Physical Exam ROS Blood pressure 126/87, pulse 90, temperature 98.6 F (37 C), temperature source Oral, resp. rate 17, height 5\' 8"  (1.727 m), weight 85.8 kg, SpO2 100 %. Body mass index is 28.76 kg/m.   Treatment Plan Summary: Daily contact with patient to assess and evaluate symptoms and progress in treatment, Medication management, and Plan continue current medications.  , DO 07/01/2022, 2:37 PM

## 2022-07-02 NOTE — Progress Notes (Signed)
Patient calm and pleasant during assessment denying SI/HI/AVH. Pt observed interacting appropriately with staff and peers on the unit. Pt compliant with medication administration per MD orders. Pt being monitored Q 15 minutes for safety per unit protocol. Pt remains safe on the unit.  

## 2022-07-02 NOTE — Progress Notes (Signed)
[facility] MD Progress Note  07/02/2022 5:47 PM Adam [name]  MRN:  [id number] Subjective: Patient seen and chart reviewed.  No change to any of his clinical condition.  Behavior stable.  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 No pertinent surgical history. Family History: History reviewed. No pertinent family history. Family Psychiatric  History: See previous Social History:  Social History   Substance and Sexual Activity  Alcohol Use Not Currently     Social History   Substance and Sexual Activity  Drug Use Not Currently    Social History   Socioeconomic History   Marital status: Single    Spouse name: Not on file   Number of children: Not on file   Years of education: Not on file   Highest education level: Not on file  Occupational History   Not on file  Tobacco Use   Smoking status: Never    Passive exposure: Never   Smokeless tobacco: Never  Vaping Use   Vaping Use: Unknown  Substance and Sexual Activity   Alcohol use: Not Currently   Drug use: Not Currently   Sexual activity: Not Currently  Other Topics Concern   Not on file  Social History Narrative   Not on file   Social Determinants of Health   Financial Resource Strain: Not on file  Food Insecurity: Not on file  Transportation Needs: Not on  file  Physical Activity: Not on file  Stress: Not on file  Social Connections: Not on file   Additional Social History:  Specify valuables returned:  (none)                      Sleep: Fair  Appetite:  Fair  Current Medications: Current Facility-Administered Medications  Medication Dose Route Frequency Provider Last Rate Last Admin   acetaminophen (TYLENOL) tablet 650 mg  650 mg Oral Q6H PRN Glada Wickstrom T, MD   650 mg at 06/20/22 1351   alum & mag hydroxide-simeth (MAALOX/MYLANTA) 200-200-20 MG/5ML suspension 30 mL  30 mL Oral Q4H PRN Swan Fairfax T, MD   30 mL at 04/26/22 4481   aspirin EC tablet 81 mg  81 mg Oral Daily Courtny Bennison, [name], MD   81 mg at 07/02/22 0855   atorvastatin (LIPITOR) tablet 20 mg  20 mg Oral Daily Tennille Montelongo T, MD   20 mg at 07/02/22 0855   atropine 1 % ophthalmic solution 1 drop  1 drop Sublingual QID [name], MD   1 drop at 07/02/22 1616   benztropine (COGENTIN) tablet 0.5 mg  0.5 mg Oral BID Shelby Anderle T, MD   0.5 mg at 07/02/22 1615   clonazePAM (KLONOPIN) tablet 1 mg  1 mg Oral TID PRN He, [name], MD   1 mg at 06/28/22 0953   cloZAPine (CLOZARIL) tablet 300 mg  300 mg Oral  QHS Hodaya Curto, [name], MD   300 mg at 07/01/22 2052   diphenhydrAMINE (BENADRYL) capsule 50 mg  50 mg Oral Q6H PRN Nandi Tonnesen T, MD   50 mg at 06/28/22 1337   Or   diphenhydrAMINE (BENADRYL) injection 50 mg  50 mg Intramuscular Q6H PRN Flora Ratz T, MD       gabapentin (NEURONTIN) capsule 300 mg  300 mg Oral TID Levaeh Vice T, MD   300 mg at 07/02/22 1615   haloperidol (HALDOL) tablet 5 mg  5 mg Oral TID Rashaunda Rahl, [name], MD   5 mg at 07/02/22 1615   haloperidol (HALDOL) tablet 5 mg  5 mg Oral Q6H PRN Kaitelyn Jamison, [name], MD   5 mg at 06/15/22 2112   Or   haloperidol lactate (HALDOL) injection 5 mg  5 mg Intramuscular Q6H PRN Manpreet Strey T, MD       magnesium hydroxide (MILK OF MAGNESIA) suspension 30 mL  30 mL Oral Daily PRN Graesyn Schreifels T, MD   30 mL at 05/02/22  9371   metoprolol succinate (TOPROL-XL) 24 hr tablet 25 mg  25 mg Oral Daily Kennetha Pearman T, MD   25 mg at 07/01/22 0841   paliperidone (INVEGA SUSTENNA) injection 156 mg  156 mg Intramuscular Q28 days Dartagnan Beavers T, MD   156 mg at 06/21/22 0917   temazepam (RESTORIL) capsule 15 mg  15 mg Oral QHS Terica Yogi T, MD   15 mg at 07/01/22 2053   ziprasidone (GEODON) injection 20 mg  20 mg Intramuscular Q12H PRN Jaleal Schliep, [name], MD   20 mg at 06/24/22 1045    Lab Results: No results found for this or any previous visit (from the past 48 hour(s)).  Blood Alcohol level:  Lab Results  Component Value Date   ETH <10 07/20/2021    Metabolic Disorder Labs: Lab Results  Component Value Date   HGBA1C 5.5 04/10/2022   MPG 111.15 04/10/2022   No results found for: "PROLACTIN" Lab Results  Component Value Date   CHOL 167 11/20/2021   TRIG 107 11/20/2021   HDL 26 (L) 11/20/2021   CHOLHDL 6.4 11/20/2021   VLDL 21 11/20/2021   LDLCALC 120 (H) 11/20/2021    Physical Findings: AIMS: Facial and Oral Movements Muscles of Facial Expression: None, normal Lips and Perioral Area: None, normal Jaw: None, normal Tongue: None, normal,Extremity Movements Upper (arms, wrists, hands, fingers): None, normal Lower (legs, knees, ankles, toes): None, normal, Trunk Movements Neck, shoulders, hips: None, normal, Overall Severity Severity of abnormal movements (highest score from questions above): None, normal Incapacitation due to abnormal movements: None, normal Patient's awareness of abnormal movements (rate only patient's report): No Awareness, Dental Status Current problems with teeth and/or dentures?: No Does patient usually wear dentures?: No  CIWA:    COWS:     Musculoskeletal: Strength & Muscle Tone: within normal limits Gait & Station: normal Patient leans: N/A  Psychiatric Specialty Exam:  Presentation  General Appearance: Appropriate for Environment; Casual; Neat; Well  Groomed  Eye Contact:Fair  Speech:Slow  Speech Volume:Decreased  Handedness:Left   Mood and Affect  Mood:Anxious  Affect:Congruent   Thought Process  Thought Processes:Goal Directed  Descriptions of Associations:Circumstantial  Orientation:Partial  Thought Content:Scattered  History of Schizophrenia/Schizoaffective disorder:Yes  Duration of Psychotic Symptoms:Greater than six months  Hallucinations:No data recorded Ideas of Reference:None  Suicidal Thoughts:No data recorded Homicidal Thoughts:No data recorded  Sensorium  Memory:Remote Good  Judgment:Fair  Insight:Fair   Executive Functions  Concentration:Fair  Attention Span:Fair  Recall:Fair  Fund of Knowledge:Fair  Language:Fair   Psychomotor Activity  Psychomotor Activity:No data recorded  Assets  Assets:Housing; Resilience; Social Support; Talents/Skills   Sleep  Sleep:No data recorded   Physical Exam: Physical Exam Vitals and nursing note reviewed.  Constitutional:      Appearance: Normal appearance.  HENT:     Head: Normocephalic and atraumatic.     Mouth/Throat:     Pharynx: Oropharynx is clear.  Eyes:     Pupils: Pupils are equal, round, and reactive to light.  Cardiovascular:     Rate and Rhythm: Normal rate and regular rhythm.  Pulmonary:     Effort: Pulmonary effort is normal.     Breath sounds: Normal breath sounds.  Abdominal:     General: Abdomen is flat.     Palpations: Abdomen is soft.  Musculoskeletal:        General: Normal range of motion.  Skin:    General: Skin is warm and dry.  Neurological:     General: No focal deficit present.     Mental Status: He is alert. Mental status is at baseline.  Psychiatric:        Mood and Affect: Mood normal.        Thought Content: Thought content normal.    Review of Systems  Unable to perform ROS: Patient nonverbal  Neurological: Negative.    Blood pressure 121/87, pulse (!) 40, temperature 98.3 F (36.8 C),  temperature source Oral, resp. rate 18, height 5\' 8"  (1.727 m), weight 85.8 kg, SpO2 99 %. Body mass index is 28.76 kg/m.   Treatment Plan Summary: Plan no change to current treatment.  Continue medication.  Still hoping for placement.  , MD 07/02/2022, 5:47 PM

## 2022-07-02 NOTE — Progress Notes (Signed)
D: Patient alert and oriented. Patient denies pain. Patient denies anxiety and depression. Patient denies SI/HI/AVH. Patient frequently observed around the nurses station during shift. Patient would walk around the nurses station trying to look for phone. Patient would ask different staff members for "computer."   A: Scheduled medications administered to patient, per MD orders. Support and encouragement provided to patient.  Q15 minute safety checks maintained.   R: Patient compliant with medication administration and treatment plan. No adverse drug reactions noted. Patient remains safe on the unit at this time.

## 2022-07-02 NOTE — Progress Notes (Signed)
   07/02/22 0500  Psych Admission Type (Psych Patients Only)  Admission Status Involuntary  Psychosocial Assessment  Patient Complaints Irritability  Eye Contact Avoids  Facial Expression Anxious  Affect Appropriate to circumstance  Speech Aphasic  Interaction Minimal  Motor Activity Pacing  Appearance/Hygiene Unremarkable  Behavior Characteristics Cooperative  Mood Irritable  Thought Process  Coherency Circumstantial  Content UTA  Delusions None reported or observed  Perception UTA  Hallucination None reported or observed  Judgment Limited  Confusion Mild  Danger to Self  Current suicidal ideation? Denies  Danger to Others  Danger to Others None reported or observed

## 2022-07-03 NOTE — Progress Notes (Signed)
Patient calm and pleasant during assessment denying SI/HI/AVH. Pt observed interacting appropriately with staff and peers on the unit. Pt compliant with medication administration per MD orders. Pt being monitored Q 15 minutes for safety per unit protocol. Pt remains safe on the unit.  

## 2022-07-03 NOTE — Progress Notes (Signed)
[employer] MD Progress Note  07/03/2022 3:48 PM Adam [name]  MRN:  [id number] Subjective: Follow-up patient with schizophrenia and brain injury. 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 No pertinent surgical history. Family History: History reviewed. No pertinent family history. Family Psychiatric  History: See previous Social History:  Social History   Substance and Sexual Activity  Alcohol Use Not Currently     Social History   Substance and Sexual Activity  Drug Use Not Currently    Social History   Socioeconomic History   Marital status: Single    Spouse name: Not on file   Number of children: Not on file   Years of education: Not on file   Highest education level: Not on file  Occupational History   Not on file  Tobacco Use   Smoking status: Never    Passive exposure: Never   Smokeless tobacco: Never  Vaping Use   Vaping Use: Unknown  Substance and Sexual Activity   Alcohol use: Not Currently   Drug use: Not Currently   Sexual activity: Not Currently  Other Topics Concern   Not on file  Social History Narrative   Not on file   Social Determinants of Health   Financial Resource Strain: Not on file  Food Insecurity: Not on file  Transportation Needs: Not on file  Physical Activity: Not on file  Stress: Not on file  Social Connections: Not on file   Additional  Social History:  Specify valuables returned:  (none)                      Sleep: Negative  Appetite:  Negative  Current Medications: Current Facility-Administered Medications  Medication Dose Route Frequency Provider Last Rate Last Admin   acetaminophen (TYLENOL) tablet 650 mg  650 mg Oral Q6H PRN Dany Harten T, MD   650 mg at 06/20/22 1351   alum & mag hydroxide-simeth (MAALOX/MYLANTA) 200-200-20 MG/5ML suspension 30 mL  30 mL Oral Q4H PRN Emiko Osorto T, MD   30 mL at 04/26/22 5284   aspirin EC tablet 81 mg  81 mg Oral Daily Zeriyah Wain, [name], MD   81 mg at 07/03/22 0806   atorvastatin (LIPITOR) tablet 20 mg  20 mg Oral Daily Mikaelyn Arthurs T, MD   20 mg at 07/03/22 0806   atropine 1 % ophthalmic solution 1 drop  1 drop Sublingual QID [name], MD   1 drop at 07/03/22 1226   benztropine (COGENTIN) tablet 0.5 mg  0.5 mg Oral BID Baylee Campus T, MD   0.5 mg at 07/03/22 0806   clonazePAM (KLONOPIN) tablet 1 mg  1 mg Oral TID PRN He, [name], MD   1 mg at 06/28/22 0953   cloZAPine (CLOZARIL) tablet 300 mg  300 mg Oral QHS Matheus Spiker T, MD   300 mg at 07/02/22 2132   diphenhydrAMINE (BENADRYL) capsule 50 mg  50 mg Oral  Q6H PRN Kohl Polinsky, [name], MD   50 mg at 06/28/22 1337   Or   diphenhydrAMINE (BENADRYL) injection 50 mg  50 mg Intramuscular Q6H PRN Nelissa Bolduc T, MD       gabapentin (NEURONTIN) capsule 300 mg  300 mg Oral TID Westen Dinino T, MD   300 mg at 07/03/22 1226   haloperidol (HALDOL) tablet 5 mg  5 mg Oral TID Jakera Beaupre T, MD   5 mg at 07/03/22 1226   haloperidol (HALDOL) tablet 5 mg  5 mg Oral Q6H PRN Ashwika Freels, [name], MD   5 mg at 06/15/22 2112   Or   haloperidol lactate (HALDOL) injection 5 mg  5 mg Intramuscular Q6H PRN Kilan Banfill T, MD       magnesium hydroxide (MILK OF MAGNESIA) suspension 30 mL  30 mL Oral Daily PRN Ark Agrusa T, MD   30 mL at 05/02/22 1610   metoprolol succinate (TOPROL-XL) 24 hr tablet 25 mg  25 mg Oral Daily Nhia Heaphy T, MD    25 mg at 07/03/22 0806   paliperidone (INVEGA SUSTENNA) injection 156 mg  156 mg Intramuscular Q28 days Alejandro Adcox T, MD   156 mg at 06/21/22 0917   temazepam (RESTORIL) capsule 15 mg  15 mg Oral QHS Shamarion Coots T, MD   15 mg at 07/02/22 2132   ziprasidone (GEODON) injection 20 mg  20 mg Intramuscular Q12H PRN Zo Loudon, [name], MD   20 mg at 06/24/22 1045    Lab Results: No results found for this or any previous visit (from the past 48 hour(s)).  Blood Alcohol level:  Lab Results  Component Value Date   ETH <10 07/20/2021    Metabolic Disorder Labs: Lab Results  Component Value Date   HGBA1C 5.5 04/10/2022   MPG 111.15 04/10/2022   No results found for: "PROLACTIN" Lab Results  Component Value Date   CHOL 167 11/20/2021   TRIG 107 11/20/2021   HDL 26 (L) 11/20/2021   CHOLHDL 6.4 11/20/2021   VLDL 21 11/20/2021   LDLCALC 120 (H) 11/20/2021    Physical Findings: AIMS: Facial and Oral Movements Muscles of Facial Expression: None, normal Lips and Perioral Area: None, normal Jaw: None, normal Tongue: None, normal,Extremity Movements Upper (arms, wrists, hands, fingers): None, normal Lower (legs, knees, ankles, toes): None, normal, Trunk Movements Neck, shoulders, hips: None, normal, Overall Severity Severity of abnormal movements (highest score from questions above): None, normal Incapacitation due to abnormal movements: None, normal Patient's awareness of abnormal movements (rate only patient's report): No Awareness, Dental Status Current problems with teeth and/or dentures?: No Does patient usually wear dentures?: No  CIWA:    COWS:     Musculoskeletal: Strength & Muscle Tone: within normal limits Gait & Station: normal Patient leans: N/A  Psychiatric Specialty Exam:  Presentation  General Appearance: Appropriate for Environment; Casual; Neat; Well Groomed  Eye Contact:Fair  Speech:Slow  Speech Volume:Decreased  Handedness:Left   Mood and Affect   Mood:Anxious  Affect:Congruent   Thought Process  Thought Processes:Goal Directed  Descriptions of Associations:Circumstantial  Orientation:Partial  Thought Content:Scattered  History of Schizophrenia/Schizoaffective disorder:Yes  Duration of Psychotic Symptoms:Greater than six months  Hallucinations:No data recorded Ideas of Reference:None  Suicidal Thoughts:No data recorded Homicidal Thoughts:No data recorded  Sensorium  Memory:Remote Good  Judgment:Fair  Insight:Fair   Executive Functions  Concentration:Fair  Attention Span:Fair  Recall:Fair  Fund of Knowledge:Fair  Language:Fair   Psychomotor Activity  Psychomotor Activity:No data recorded  Assets  Assets:Housing; Resilience; Social Support; Talents/Skills   Sleep  Sleep:No data recorded   Physical Exam: Physical Exam Vitals and nursing note reviewed.  Constitutional:      Appearance: Normal appearance.  HENT:     Head: Normocephalic and atraumatic.     Mouth/Throat:     Pharynx: Oropharynx is clear.  Eyes:     Pupils: Pupils are equal, round, and reactive to light.  Cardiovascular:     Rate and Rhythm: Normal rate and regular rhythm.  Pulmonary:     Effort: Pulmonary effort is normal.     Breath sounds: Normal breath sounds.  Abdominal:     General: Abdomen is flat.     Palpations: Abdomen is soft.  Musculoskeletal:        General: Normal range of motion.  Skin:    General: Skin is warm and dry.  Neurological:     General: No focal deficit present.     Mental Status: He is alert. Mental status is at baseline.  Psychiatric:        Mood and Affect: Mood is depressed. Affect is blunt and inappropriate.        Speech: He is noncommunicative. Speech is tangential.        Behavior: Behavior is withdrawn.        Thought Content: Thought content is paranoid and delusional.        Cognition and Memory: Cognition is impaired.    Review of Systems  Constitutional: Negative.    HENT: Negative.    Eyes: Negative.   Respiratory: Negative.    Cardiovascular: Negative.   Gastrointestinal: Negative.   Musculoskeletal: Negative.   Skin: Negative.   Neurological: Negative.   Psychiatric/Behavioral:  Negative for hallucinations.    Blood pressure (!) 141/88, pulse 86, temperature 97.8 F (36.6 C), temperature source Oral, resp. rate 18, height 5\' 8"  (1.727 m), weight 85.8 kg, SpO2 100 %. Body mass index is 28.76 kg/m.   Treatment Plan Summary: Plan no change to current medicine.  Awaiting placement  , MD 07/03/2022, 3:48 PM

## 2022-07-03 NOTE — TOC Progression Note (Signed)
Transition of Care [facility]) - Progression Note    Patient Details  Name: Adam [name] MRN: [id number] Date of Birth: 1961-09-12  [facility] CM/SW Contact  Margarito Liner, LCSW Phone Number: 07/03/2022, 2:16 PM  Clinical Narrative:   Per Clarita Crane with [facility], no updates on placement. She is not aware of any movement on the wait lists for the 3 neurocognitive centers.  Expected Discharge Plan and Services                                                 Social Determinants of Health (SDOH) Interventions    Readmission Risk Interventions     No data to display

## 2022-07-03 NOTE — Progress Notes (Signed)
D: Patient alert and oriented. Patient denies pain. Patient denies anxiety and depression. Patient denies SI/HI/AVH. Patient frequently comes up to the nurses station. Patient did come up to the nurses station during the morning to give phone to staff member because it is broken. Shortly after patient came back up to the nurses station to get the phone back. After dinner patient is singing and repeating the word "madonna."  A: Scheduled medications administered to patient, per MD orders.  Support and encouragement provided to patient.  Q15 minute safety checks maintained.   R: Patient compliant with medication administration and treatment plan. No adverse drug reactions noted. Patient remains safe on the unit at this time.

## 2022-07-03 NOTE — Plan of Care (Signed)
  Problem: Education: Goal: Ability to state activities that reduce stress will improve Outcome: Progressing   Problem: Coping: Goal: Ability to identify and develop effective coping behavior will improve Outcome: Progressing   Problem: Self-Concept: Goal: Ability to identify factors that promote anxiety will improve Outcome: Progressing Goal: Level of anxiety will decrease Outcome: Progressing   Problem: Education: Goal: Knowledge of General Education information will improve Description: Including pain rating scale, medication(s)/side effects and non-pharmacologic comfort measures Outcome: Progressing   Problem: Clinical Measurements: Goal: Ability to maintain clinical measurements within normal limits will improve Outcome: Progressing

## 2022-07-04 LAB — CBC WITH DIFFERENTIAL/PLATELET
Abs Immature Granulocytes: 0.01 10*3/uL (ref 0.00–0.07)
Basophils Absolute: 0.1 10*3/uL (ref 0.0–0.1)
Basophils Relative: 1 %
Eosinophils Absolute: 0 10*3/uL (ref 0.0–0.5)
Eosinophils Relative: 1 %
HCT: 40.9 % (ref 39.0–52.0)
Hemoglobin: 14.1 g/dL (ref 13.0–17.0)
Immature Granulocytes: 0 %
Lymphocytes Relative: 38 %
Lymphs Abs: 1.6 10*3/uL (ref 0.7–4.0)
MCH: 29.4 pg (ref 26.0–34.0)
MCHC: 34.5 g/dL (ref 30.0–36.0)
MCV: 85.4 fL (ref 80.0–100.0)
Monocytes Absolute: 0.4 10*3/uL (ref 0.1–1.0)
Monocytes Relative: 9 %
Neutro Abs: 2.2 10*3/uL (ref 1.7–7.7)
Neutrophils Relative %: 51 %
Platelets: 158 10*3/uL (ref 150–400)
RBC: 4.79 MIL/uL (ref 4.22–5.81)
RDW: 13.2 % (ref 11.5–15.5)
WBC: 4.3 10*3/uL (ref 4.0–10.5)
nRBC: 0 % (ref 0.0–0.2)

## 2022-07-04 NOTE — Plan of Care (Signed)
Patient engagement this morning characterized by oddly and intently related and atypical interpersonal style, abrupt, loud, curt and repetitive speech (I.e., says no multiple times repeatedly to anything stated by this writer), behavioral and thought disinhibition, and limited ability to participate in assessment/engagement. Patient when asked assessment questions just repeated no to anything asked, but did not appear in any physical pain.    Patient has been complaint with medications and unit procedures thus far, but requires lots of direction, redirection, and encouragement. Pt. Has been observed skipping breakfast. Pt. Has been able to remain safe on the unit thus far. Patient today has been observed isolative and withdrawn to his room sleeping in bed all shift thus far.   Q x 15 minute observation checks in place/maintained for safety. Patient is provided with education throughout shift when appropriate and able.  Patient is given/offered medications per orders. Patient is encouraged to attend groups, participate in unit activities and continue with plan of care. Pt. Chart and plans of care reviewed. Pt. Given support and encouragement when appropriate and able.        Problem: Pain Managment: Goal: General experience of comfort will improve Outcome: Progressing Note: Patient upon assessment has not presented appearing to be in any appreciable pain.    Problem: Safety: Goal: Ability to remain free from injury will improve Outcome: Progressing Note: Patient has been able to be kept safe on the unit.

## 2022-07-04 NOTE — Progress Notes (Signed)
Pharmacy - Clozapine     This patient's order has been reviewed for prescribing contraindications.   Clozapine REMS enrollment Verified: yes Current Outpatient Monitoring: weekly  Home Regimen:    Dose Adjustments This Admission:   Labs: 7/26 ANC 2500 K/uL 8/2 ANC 2200   Plan: Lab submitted to clozapine REMS Continue with weekly ANC labs while inpatient   **The medication is being dispensed pursuant to the FDA REMS suspension order of 10/21/20 that allows for dispensing without a patient REMS dispense authorization (RDA).    Bari Mantis PharmD Clinical Pharmacist 07/04/2022

## 2022-07-04 NOTE — Progress Notes (Signed)
Patient calm and pleasant during assessment denying SI/HI/AVH. Pt observed interacting appropriately with staff and peers on the unit. Pt compliant with medication administration per MD orders. Pt being monitored Q 15 minutes for safety per unit protocol. Pt remains safe on the unit.

## 2022-07-04 NOTE — Progress Notes (Signed)
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 No pertinent surgical history. Family History: History reviewed. No pertinent family history. Family Psychiatric  History: See previous Social History:  Social History   Substance and Sexual Activity  Alcohol Use Not Currently     Social History   Substance and Sexual Activity  Drug Use Not Currently    Social History   Socioeconomic History   Marital status: Single    Spouse name: Not on file   Number of children: Not on file   Years of education: Not on file   Highest education level: Not on file  Occupational History   Not on file  Tobacco Use   Smoking status: Never    Passive exposure: Never   Smokeless tobacco: Never  Vaping Use   Vaping Use: Unknown  Substance and Sexual Activity   Alcohol use: Not Currently   Drug use: Not Currently   Sexual activity: Not Currently  Other Topics Concern   Not on file  Social History Narrative   Not on file   Social Determinants of Health   Financial Resource Strain: Not on file  Food Insecurity: Not on file  Transportation Needs: Not on file  Physical Activity: Not on file  Stress: Not on file  Social Connections:  Not on file   Additional Social History:  Specify valuables returned:  (none)                      Sleep: Fair  Appetite:  Fair  Current Medications: Current Facility-Administered Medications  Medication Dose Route Frequency Provider Last Rate Last Admin   acetaminophen (TYLENOL) tablet 650 mg  650 mg Oral Q6H PRN Shaletha Humble T, MD   650 mg at 06/20/22 1351   alum & mag hydroxide-simeth (MAALOX/MYLANTA) 200-200-20 MG/5ML suspension 30 mL  30 mL Oral Q4H PRN Breckin Zafar T, MD   30 mL at 04/26/22 0932   aspirin EC tablet 81 mg  81 mg Oral Daily Aqueelah Cotrell, [name], MD   81 mg at 07/04/22 0735   atorvastatin (LIPITOR) tablet 20 mg  20 mg Oral Daily Ashanti Littles T, MD   20 mg at 07/04/22 0735   atropine 1 % ophthalmic solution 1 drop  1 drop Sublingual QID [name], MD   1 drop at 07/04/22 1227   benztropine (COGENTIN) tablet 0.5 mg  0.5 mg Oral BID Oleda Borski T, MD   0.5 mg at 07/04/22 0736   clonazePAM (KLONOPIN) tablet 1 mg  1 mg Oral TID PRN He, [name], MD   1 mg at 06/28/22 0953   cloZAPine (CLOZARIL) tablet 300 mg  300 mg Oral QHS Arianah Torgeson T, MD   300 mg at 07/03/22 2059   diphenhydrAMINE (BENADRYL) capsule 50 mg  50 mg Oral Q6H PRN Jannette Cotham T, MD   50 mg at 06/28/22 1337   Or   diphenhydrAMINE (BENADRYL) injection 50 mg  50 mg Intramuscular Q6H PRN Briele Lagasse T, MD       gabapentin (NEURONTIN) capsule 300 mg  300 mg Oral TID Aribelle Mccosh T, MD   300 mg at 07/04/22 1226   haloperidol (HALDOL) tablet 5 mg  5 mg Oral TID Jamilia Jacques T, MD   5 mg at 07/04/22 1226   haloperidol (HALDOL) tablet 5 mg  5 mg Oral Q6H PRN Lester Platas, [name], MD   5 mg at 06/15/22 2112   Or   haloperidol lactate (HALDOL) injection 5 mg  5 mg Intramuscular Q6H PRN Chinmay Squier T, MD       magnesium hydroxide (MILK OF MAGNESIA) suspension 30 mL  30 mL Oral Daily PRN Arlington Sigmund T, MD   30 mL at 05/02/22 3335   metoprolol succinate (TOPROL-XL) 24 hr tablet 25 mg  25 mg Oral Daily  Tolbert Matheson T, MD   25 mg at 07/04/22 0736   paliperidone (INVEGA SUSTENNA) injection 156 mg  156 mg Intramuscular Q28 days Verlyn Dannenberg T, MD   156 mg at 06/21/22 0917   temazepam (RESTORIL) capsule 15 mg  15 mg Oral QHS Jaymie Misch T, MD   15 mg at 07/03/22 2100   ziprasidone (GEODON) injection 20 mg  20 mg Intramuscular Q12H PRN Alaina Donati, [name], MD   20 mg at 06/24/22 1045    Lab Results: No results found for this or any previous visit (from the past 48 hour(s)).  Blood Alcohol level:  Lab Results  Component Value Date   ETH <10 07/20/2021    Metabolic Disorder Labs: Lab Results  Component Value Date   HGBA1C 5.5 04/10/2022   MPG 111.15 04/10/2022   No results found for: "PROLACTIN" Lab Results  Component Value Date   CHOL 167 11/20/2021   TRIG 107 11/20/2021   HDL 26 (L) 11/20/2021   CHOLHDL 6.4 11/20/2021   VLDL 21 11/20/2021   LDLCALC 120 (H) 11/20/2021    Physical Findings: AIMS: Facial and Oral Movements Muscles of Facial Expression: None, normal Lips and Perioral Area: None, normal Jaw: None, normal Tongue: None, normal,Extremity Movements Upper (arms, wrists, hands, fingers): None, normal Lower (legs, knees, ankles, toes): None, normal, Trunk Movements Neck, shoulders, hips: None, normal, Overall Severity Severity of abnormal movements (highest score from questions above): None, normal Incapacitation due to abnormal movements: None, normal Patient's awareness of abnormal movements (rate only patient's report): No Awareness, Dental Status Current problems with teeth and/or dentures?: No Does patient usually wear dentures?: No  CIWA:    COWS:     Musculoskeletal: Strength & Muscle Tone: within normal limits Gait & Station: normal Patient leans: N/A  Psychiatric Specialty Exam:  Presentation  General Appearance: Appropriate for Environment; Casual; Neat; Well Groomed  Eye Contact:Fair  Speech:Slow  Speech  Volume:Decreased  Handedness:Left   Mood and Affect  Mood:Anxious  Affect:Congruent   Thought Process  Thought Processes:Goal Directed  Descriptions of Associations:Circumstantial  Orientation:Partial  Thought Content:Scattered  History of Schizophrenia/Schizoaffective disorder:Yes  Duration of Psychotic Symptoms:Greater than six months  Hallucinations:No data recorded Ideas of Reference:None  Suicidal Thoughts:No data recorded Homicidal Thoughts:No data recorded  Sensorium  Memory:Remote Good  Judgment:Fair  Insight:Fair   Executive Functions  Concentration:Fair  Attention Span:Fair  Recall:Fair  Fund of Knowledge:Fair  Language:Fair   Psychomotor Activity  Psychomotor Activity:No data  recorded  Assets  Assets:Housing; Resilience; Social Support; Talents/Skills   Sleep  Sleep:No data recorded   Physical Exam: Physical Exam Vitals and nursing note reviewed.  Constitutional:      Appearance: Normal appearance.  HENT:     Head: Normocephalic and atraumatic.     Mouth/Throat:     Pharynx: Oropharynx is clear.  Eyes:     Pupils: Pupils are equal, round, and reactive to light.  Cardiovascular:     Rate and Rhythm: Normal rate and regular rhythm.  Pulmonary:     Effort: Pulmonary effort is normal.     Breath sounds: Normal breath sounds.  Abdominal:     General: Abdomen is flat.     Palpations: Abdomen is soft.  Musculoskeletal:        General: Normal range of motion.  Skin:    General: Skin is warm and dry.  Neurological:     General: No focal deficit present.     Mental Status: He is alert. Mental status is at baseline.  Psychiatric:        Attention and Perception: He is inattentive.        Mood and Affect: Affect is blunt.        Speech: He is noncommunicative.    Review of Systems  Unable to perform ROS: Language   Blood pressure 122/89, pulse 89, temperature 98.6 F (37 C), temperature source Oral, resp. rate 18, height  5\' 8"  (1.727 m), weight 85.8 kg, SpO2 97 %. Body mass index is 28.76 kg/m.   Treatment Plan Summary: Medication management and Plan absolutely nothing acute.  Stable on medicine.  No significant behavior problems.  Awaiting placement  , MD 07/04/2022, 1:24 PM

## 2022-07-05 NOTE — Progress Notes (Signed)
   07/05/22 2100  Psych Admission Type (Psych Patients Only)  Admission Status Involuntary  Psychosocial Assessment  Patient Complaints None  Eye Contact Brief  Facial Expression Animated  Affect Anxious  Speech Aphasic  Interaction Minimal  Motor Activity Pacing  Appearance/Hygiene Unremarkable  Behavior Characteristics Cooperative;Pacing  Mood Anxious  Thought Process  Coherency Circumstantial  Judgment Impaired  Confusion None  Danger to Self  Current suicidal ideation?  (Denies)  Agreement Not to Harm Self No  Description of Agreement Verbal

## 2022-07-05 NOTE — Progress Notes (Signed)
Recreation Therapy Notes  Date: 07/05/2022  Time: 10:15 am    Location: Craft room   Behavioral response: N/A   Intervention Topic: Stress Management    Discussion/Intervention: Patient refused to attend group.   Clinical Observations/Feedback:  Patient refused to attend group.    Aleta Manternach LRT/CTRS        Antoni Stefan 07/05/2022 1:33 PM
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
## 2022-07-05 NOTE — Progress Notes (Signed)
D: Patient alert and oriented. Patient denies pain. Patient denies anxiety and depression. Patient denies SI/HI/AVH. Patient came up to the nurses station to have phone fixed. After patient phone was fixed and patient has been in a pleasant mood. Patient has been repeating "Thor".  A: Scheduled medications administered to patient, per MD orders.  Support and encouragement provided to patient.  Q15 minute safety checks maintained.   R: Patient compliant with medication administration and treatment plan. No adverse drug reactions noted. Patient remains safe on the unit at this time.

## 2022-07-05 NOTE — Plan of Care (Signed)
°  Problem: Group Participation °Goal: STG - Patient will engage in groups with a calm and appropriate mood at least 2x within 5 recreation therapy group sessions °Description: STG - Patient will engage in groups with a calm and appropriate mood at least 2x within 5 recreation therapy group sessions °Outcome: Not Progressing °  °

## 2022-07-06 LAB — CBC WITH DIFFERENTIAL/PLATELET
Abs Immature Granulocytes: 0.01 10*3/uL (ref 0.00–0.07)
Basophils Absolute: 0.1 10*3/uL (ref 0.0–0.1)
Basophils Relative: 1 %
Eosinophils Absolute: 0.1 10*3/uL (ref 0.0–0.5)
Eosinophils Relative: 1 %
HCT: 39.8 % (ref 39.0–52.0)
Hemoglobin: 13.6 g/dL (ref 13.0–17.0)
Immature Granulocytes: 0 %
Lymphocytes Relative: 30 %
Lymphs Abs: 1.3 10*3/uL (ref 0.7–4.0)
MCH: 29.7 pg (ref 26.0–34.0)
MCHC: 34.2 g/dL (ref 30.0–36.0)
MCV: 86.9 fL (ref 80.0–100.0)
Monocytes Absolute: 0.4 10*3/uL (ref 0.1–1.0)
Monocytes Relative: 8 %
Neutro Abs: 2.5 10*3/uL (ref 1.7–7.7)
Neutrophils Relative %: 60 %
Platelets: 145 10*3/uL — ABNORMAL LOW (ref 150–400)
RBC: 4.58 MIL/uL (ref 4.22–5.81)
RDW: 13.4 % (ref 11.5–15.5)
WBC: 4.2 10*3/uL (ref 4.0–10.5)
nRBC: 0 % (ref 0.0–0.2)

## 2022-07-06 NOTE — Progress Notes (Signed)
   07/06/22 0900  Psych Admission Type (Psych Patients Only)  Admission Status Involuntary  Psychosocial Assessment  Patient Complaints None  Eye Contact Brief  Facial Expression Animated  Affect Anxious  Speech Aphasic  Interaction Minimal  Motor Activity Pacing  Appearance/Hygiene Unremarkable  Behavior Characteristics Cooperative  Mood Anxious  Thought Process  Coherency Circumstantial  Judgment Impaired  Confusion None  Danger to Self  Current suicidal ideation? Denies  Danger to Others  Danger to Others None reported or observed

## 2022-07-06 NOTE — Progress Notes (Signed)
Recreation Therapy Notes    Date: 07/06/2022   Time: 10:45 am     Location: Craft room    Behavioral response: N/A   Intervention Topic: Honesty    Discussion/Intervention: Patient refused to attend group.    Clinical Observations/Feedback:  Patient refused to attend group.    Amauris Debois LRT/CTRS        Rashea Hoskie 07/06/2022 11:26 AM

## 2022-07-06 NOTE — Progress Notes (Signed)
[facility] MD Progress Note  07/06/2022 10:59 AM [name]  MRN:  [id number] Subjective: Follow-up 61 year old man with schizophrenia and aphasia.  Today his word of the moment is "Madonna".  Seems cheerful.  No behavior problems. 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 No pertinent surgical history. Family History: History reviewed. No pertinent family history. Family Psychiatric  History: See previous Social History:  Social History   Substance and Sexual Activity  Alcohol Use Not Currently     Social History   Substance and Sexual Activity  Drug Use Not Currently    Social History   Socioeconomic History   Marital status: Single    Spouse name: Not on file   Number of children: Not on file   Years of education: Not on file   Highest education level: Not on file  Occupational History   Not on file  Tobacco Use   Smoking status: Never    Passive exposure: Never   Smokeless tobacco: Never  Vaping Use   Vaping Use: Unknown  Substance and Sexual Activity   Alcohol use: Not Currently   Drug use: Not Currently   Sexual activity: Not Currently  Other Topics Concern   Not on file  Social History Narrative   Not on file   Social Determinants of Health   Financial Resource Strain: Not on file  Food Insecurity: Not on file  Transportation Needs: Not on file   Physical Activity: Not on file  Stress: Not on file  Social Connections: Not on file   Additional Social History:  Specify valuables returned:  (none)                      Sleep: Fair  Appetite:  Fair  Current Medications: Current Facility-Administered Medications  Medication Dose Route Frequency Provider Last Rate Last Admin   acetaminophen (TYLENOL) tablet 650 mg  650 mg Oral Q6H PRN Avelino Herren T, MD   650 mg at 06/20/22 1351   alum & mag hydroxide-simeth (MAALOX/MYLANTA) 200-200-20 MG/5ML suspension 30 mL  30 mL Oral Q4H PRN Telisha Zawadzki T, MD   30 mL at 04/26/22 6295   aspirin EC tablet 81 mg  81 mg Oral Daily Bassel Gaskill, [name], MD   81 mg at 07/06/22 0805   atorvastatin (LIPITOR) tablet 20 mg  20 mg Oral Daily Madiha Bambrick T, MD   20 mg at 07/06/22 0805   atropine 1 % ophthalmic solution 1 drop  1 drop Sublingual QID [name], MD   1 drop at 07/06/22 0806   benztropine (COGENTIN) tablet 0.5 mg  0.5 mg Oral BID Greysen Swanton T, MD   0.5 mg at 07/06/22 0805   clonazePAM (KLONOPIN) tablet 1 mg  1 mg Oral TID PRN He, [name], MD   1 mg at 06/28/22 0953   cloZAPine (CLOZARIL) tablet 300 mg  300 mg Oral QHS Sherley Leser, [name], MD  300 mg at 07/05/22 2055   diphenhydrAMINE (BENADRYL) capsule 50 mg  50 mg Oral Q6H PRN Trust Leh, [name], MD   50 mg at 07/06/22 6160   Or   diphenhydrAMINE (BENADRYL) injection 50 mg  50 mg Intramuscular Q6H PRN Erwin Nishiyama, [name], MD       gabapentin (NEURONTIN) capsule 300 mg  300 mg Oral TID Lilly Gasser, [name], MD   300 mg at 07/06/22 7371   haloperidol (HALDOL) tablet 5 mg  5 mg Oral TID Maitlyn Penza, [name], MD   5 mg at 07/06/22 0626   haloperidol (HALDOL) tablet 5 mg  5 mg Oral Q6H PRN Syrenity Klepacki, [name], MD   5 mg at 06/15/22 2112   Or   haloperidol lactate (HALDOL) injection 5 mg  5 mg Intramuscular Q6H PRN Danni Shima, [name], MD       magnesium hydroxide (MILK OF MAGNESIA) suspension 30 mL  30 mL Oral Daily PRN Kiannah Grunow, [name], MD   30 mL at 05/02/22 9485    metoprolol succinate (TOPROL-XL) 24 hr tablet 25 mg  25 mg Oral Daily Malikiah Debarr T, MD   25 mg at 07/06/22 0803   paliperidone (INVEGA SUSTENNA) injection 156 mg  156 mg Intramuscular Q28 days Agness Sibrian T, MD   156 mg at 06/21/22 0917   temazepam (RESTORIL) capsule 15 mg  15 mg Oral QHS Ryelle Ruvalcaba T, MD   15 mg at 07/05/22 2055   ziprasidone (GEODON) injection 20 mg  20 mg Intramuscular Q12H PRN Alliah Boulanger, [name], MD   20 mg at 06/24/22 1045    Lab Results:  Results for orders placed or performed during the hospital encounter of 03/19/22 (from the past 48 hour(s))  CBC with Differential/Platelet     Status: None   Collection Time: 07/04/22  5:36 PM  Result Value Ref Range   WBC 4.3 4.0 - 10.5 K/uL   RBC 4.79 4.22 - 5.81 MIL/uL   Hemoglobin 14.1 13.0 - 17.0 g/dL   HCT 46.2 70.3 - 50.0 %   MCV 85.4 80.0 - 100.0 fL   MCH 29.4 26.0 - 34.0 pg   MCHC 34.5 30.0 - 36.0 g/dL   RDW 93.8 18.2 - 99.3 %   Platelets 158 150 - 400 K/uL   nRBC 0.0 0.0 - 0.2 %   Neutrophils Relative % 51 %   Neutro Abs 2.2 1.7 - 7.7 K/uL   Lymphocytes Relative 38 %   Lymphs Abs 1.6 0.7 - 4.0 K/uL   Monocytes Relative 9 %   Monocytes Absolute 0.4 0.1 - 1.0 K/uL   Eosinophils Relative 1 %   Eosinophils Absolute 0.0 0.0 - 0.5 K/uL   Basophils Relative 1 %   Basophils Absolute 0.1 0.0 - 0.1 K/uL   Immature Granulocytes 0 %   Abs Immature Granulocytes 0.01 0.00 - 0.07 K/uL    Comment: Performed at [facility], [address] [place], [place] [address]    Blood Alcohol level:  Lab Results  Component Value Date   [facility] <10 07/20/2021    Metabolic Disorder Labs: Lab Results  Component Value Date   HGBA1C 5.5 04/10/2022   MPG 111.15 04/10/2022   No results found for: "PROLACTIN" Lab Results  Component Value Date   CHOL 167 11/20/2021   TRIG 107 11/20/2021   HDL 26 (L) 11/20/2021   CHOLHDL 6.4 11/20/2021   VLDL 21 11/20/2021   LDLCALC 120 (H) 11/20/2021    Physical  Findings: AIMS:  Facial and Oral Movements Muscles of Facial Expression: None, normal Lips and Perioral Area: None, normal Jaw: None, normal Tongue: None, normal,Extremity Movements Upper (arms, wrists, hands, fingers): None, normal Lower (legs, knees, ankles, toes): None, normal, Trunk Movements Neck, shoulders, hips: None, normal, Overall Severity Severity of abnormal movements (highest score from questions above): None, normal Incapacitation due to abnormal movements: None, normal Patient's awareness of abnormal movements (rate only patient's report): No Awareness, Dental Status Current problems with teeth and/or dentures?: No Does patient usually wear dentures?: No  CIWA:    COWS:     Musculoskeletal: Strength & Muscle Tone: within normal limits Gait & Station: normal Patient leans: N/A  Psychiatric Specialty Exam:  Presentation  General Appearance: Appropriate for Environment; Casual; Neat; Well Groomed  Eye Contact:Fair  Speech:Slow  Speech Volume:Decreased  Handedness:Left   Mood and Affect  Mood:Anxious  Affect:Congruent   Thought Process  Thought Processes:Goal Directed  Descriptions of Associations:Circumstantial  Orientation:Partial  Thought Content:Scattered  History of Schizophrenia/Schizoaffective disorder:Yes  Duration of Psychotic Symptoms:Greater than six months  Hallucinations:No data recorded Ideas of Reference:None  Suicidal Thoughts:No data recorded Homicidal Thoughts:No data recorded  Sensorium  Memory:Remote Good  Judgment:Fair  Insight:Fair   Executive Functions  Concentration:Fair  Attention Span:Fair  Recall:Fair  Fund of Knowledge:Fair  Language:Fair   Psychomotor Activity  Psychomotor Activity:No data recorded  Assets  Assets:Housing; Resilience; Social Support; Talents/Skills   Sleep  Sleep:No data recorded   Physical Exam: Physical Exam Vitals and nursing note reviewed.  Constitutional:       Appearance: Normal appearance.  HENT:     Head: Normocephalic and atraumatic.     Mouth/Throat:     Pharynx: Oropharynx is clear.  Eyes:     Pupils: Pupils are equal, round, and reactive to light.  Cardiovascular:     Rate and Rhythm: Normal rate and regular rhythm.  Pulmonary:     Effort: Pulmonary effort is normal.     Breath sounds: Normal breath sounds.  Abdominal:     General: Abdomen is flat.     Palpations: Abdomen is soft.  Musculoskeletal:        General: Normal range of motion.  Skin:    General: Skin is warm and dry.  Neurological:     General: No focal deficit present.     Mental Status: He is alert. Mental status is at baseline.  Psychiatric:        Attention and Perception: He is inattentive.        Mood and Affect: Affect is blunt.        Speech: He is noncommunicative.    ROS Blood pressure (!) 150/96, pulse 82, temperature 98 F (36.7 C), temperature source Oral, resp. rate 18, height 5\' 8"  (1.727 m), weight 85.8 kg, SpO2 100 %. Body mass index is 28.76 kg/m.   Treatment Plan Summary: Plan continue current medication.  No indication for any change treatment.  Continued to seek placement  , MD 07/06/2022, 10:59 AM

## 2022-07-07 NOTE — Progress Notes (Signed)
Patient denies SI, HI, and AVH. He denies pain and other physical problems. Patient came to the dayroom to eat breakfast before returning to his room. He is compliant with scheduled medications. Support and encouragement given. Patient remains safe on the unit at this time.

## 2022-07-07 NOTE — Progress Notes (Signed)
[facility] MD Progress Note  07/07/2022 1:37 PM Adam [name]  MRN:  [id number] Subjective: Patient seen and chart reviewed.  Update patient with schizophrenia and aphasia.  No new complaints.  Had a nice little chat with him today to the extent that 1 has a chat with the patient.  I talked to him and he smiled and nodded.  We discussed 1970s progressive rock that he likes to listen to.  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 No pertinent surgical history. Family History: History reviewed. No pertinent family history. Family Psychiatric  History: See previous Social History:  Social History   Substance and Sexual Activity  Alcohol Use Not Currently     Social History   Substance and Sexual Activity  Drug Use Not Currently    Social History   Socioeconomic History   Marital status: Single    Spouse name: Not on file   Number of children: Not on file   Years of education: Not on file   Highest education level: Not on file  Occupational History   Not on file  Tobacco Use   Smoking status: Never    Passive exposure: Never   Smokeless tobacco: Never  Vaping Use   Vaping Use: Unknown  Substance and Sexual Activity   Alcohol use: Not Currently   Drug use:  Not Currently   Sexual activity: Not Currently  Other Topics Concern   Not on file  Social History Narrative   Not on file   Social Determinants of Health   Financial Resource Strain: Not on file  Food Insecurity: Not on file  Transportation Needs: Not on file  Physical Activity: Not on file  Stress: Not on file  Social Connections: Not on file   Additional Social History:  Specify valuables returned:  (none)                      Sleep: Fair  Appetite:  Fair  Current Medications: Current Facility-Administered Medications  Medication Dose Route Frequency Provider Last Rate Last Admin   acetaminophen (TYLENOL) tablet 650 mg  650 mg Oral Q6H PRN Breena Bevacqua T, MD   650 mg at 06/20/22 1351   alum & mag hydroxide-simeth (MAALOX/MYLANTA) 200-200-20 MG/5ML suspension 30 mL  30 mL Oral Q4H PRN Chenelle Benning T, MD   30 mL at 04/26/22 2876   aspirin EC tablet 81 mg  81 mg Oral Daily Didier Brandenburg, [name], MD   81 mg at 07/07/22 8115   atorvastatin (LIPITOR) tablet 20 mg  20 mg Oral Daily Keyston Ardolino T, MD   20 mg at 07/07/22 0821   atropine 1 % ophthalmic solution 1 drop  1 drop Sublingual QID [name], MD   1 drop at 07/07/22 1125   benztropine (COGENTIN) tablet 0.5 mg  0.5  mg Oral BID Prue Lingenfelter, [name], MD   0.5 mg at 07/07/22 4098   clonazePAM (KLONOPIN) tablet 1 mg  1 mg Oral TID PRN He, [name], MD   1 mg at 07/06/22 2115   cloZAPine (CLOZARIL) tablet 300 mg  300 mg Oral QHS Kallista Pae, [name], MD   300 mg at 07/06/22 2115   diphenhydrAMINE (BENADRYL) capsule 50 mg  50 mg Oral Q6H PRN Prudence Heiny, [name], MD   50 mg at 07/06/22 1191   Or   diphenhydrAMINE (BENADRYL) injection 50 mg  50 mg Intramuscular Q6H PRN Yarimar Lavis, [name], MD       gabapentin (NEURONTIN) capsule 300 mg  300 mg Oral TID Larkyn Greenberger, [name], MD   300 mg at 07/07/22 1125   haloperidol (HALDOL) tablet 5 mg  5 mg Oral TID Amiria Orrison, [name], MD   5 mg at 07/07/22 1125   haloperidol (HALDOL) tablet 5 mg  5 mg Oral Q6H PRN  Dorell Gatlin, [name], MD   5 mg at 07/06/22 2117   Or   haloperidol lactate (HALDOL) injection 5 mg  5 mg Intramuscular Q6H PRN Eural Holzschuh, [name], MD       magnesium hydroxide (MILK OF MAGNESIA) suspension 30 mL  30 mL Oral Daily PRN Cid Agena T, MD   30 mL at 05/02/22 4782   metoprolol succinate (TOPROL-XL) 24 hr tablet 25 mg  25 mg Oral Daily Tyquon Near T, MD   25 mg at 07/07/22 9562   paliperidone (INVEGA SUSTENNA) injection 156 mg  156 mg Intramuscular Q28 days Alessandro Griep T, MD   156 mg at 06/21/22 0917   temazepam (RESTORIL) capsule 15 mg  15 mg Oral QHS Mattye Verdone T, MD   15 mg at 07/06/22 2116   ziprasidone (GEODON) injection 20 mg  20 mg Intramuscular Q12H PRN Jarvis Knodel, [name], MD   20 mg at 06/24/22 1045    Lab Results:  Results for orders placed or performed during the hospital encounter of 03/19/22 (from the past 48 hour(s))  CBC with Differential/Platelet     Status: Abnormal   Collection Time: 07/06/22  2:07 PM  Result Value Ref Range   WBC 4.2 4.0 - 10.5 K/uL   RBC 4.58 4.22 - 5.81 MIL/uL   Hemoglobin 13.6 13.0 - 17.0 g/dL   HCT 13.0 86.5 - 78.4 %   MCV 86.9 80.0 - 100.0 fL   MCH 29.7 26.0 - 34.0 pg   MCHC 34.2 30.0 - 36.0 g/dL   RDW 69.6 29.5 - 28.4 %   Platelets 145 (L) 150 - 400 K/uL   nRBC 0.0 0.0 - 0.2 %   Neutrophils Relative % 60 %   Neutro Abs 2.5 1.7 - 7.7 K/uL   Lymphocytes Relative 30 %   Lymphs Abs 1.3 0.7 - 4.0 K/uL   Monocytes Relative 8 %   Monocytes Absolute 0.4 0.1 - 1.0 K/uL   Eosinophils Relative 1 %   Eosinophils Absolute 0.1 0.0 - 0.5 K/uL   Basophils Relative 1 %   Basophils Absolute 0.1 0.0 - 0.1 K/uL   Immature Granulocytes 0 %   Abs Immature Granulocytes 0.01 0.00 - 0.07 K/uL    Comment: Performed at [facility], [address] [place], [place] [address]    Blood Alcohol level:  Lab Results  Component Value Date   [facility] <10 07/20/2021    Metabolic Disorder Labs: Lab Results  Component Value Date   HGBA1C 5.5  04/10/2022  MPG 111.15 04/10/2022   No results found for: "PROLACTIN" Lab Results  Component Value Date   CHOL 167 11/20/2021   TRIG 107 11/20/2021   HDL 26 (L) 11/20/2021   CHOLHDL 6.4 11/20/2021   VLDL 21 11/20/2021   LDLCALC 120 (H) 11/20/2021    Physical Findings: AIMS: Facial and Oral Movements Muscles of Facial Expression: None, normal Lips and Perioral Area: None, normal Jaw: None, normal Tongue: None, normal,Extremity Movements Upper (arms, wrists, hands, fingers): None, normal Lower (legs, knees, ankles, toes): None, normal, Trunk Movements Neck, shoulders, hips: None, normal, Overall Severity Severity of abnormal movements (highest score from questions above): None, normal Incapacitation due to abnormal movements: None, normal Patient's awareness of abnormal movements (rate only patient's report): No Awareness, Dental Status Current problems with teeth and/or dentures?: No Does patient usually wear dentures?: No  CIWA:    COWS:     Musculoskeletal: Strength & Muscle Tone: within normal limits Gait & Station: normal Patient leans: N/A  Psychiatric Specialty Exam:  Presentation  General Appearance: Appropriate for Environment; Casual; Neat; Well Groomed  Eye Contact:Fair  Speech:Slow  Speech Volume:Decreased  Handedness:Left   Mood and Affect  Mood:Anxious  Affect:Congruent   Thought Process  Thought Processes:Goal Directed  Descriptions of Associations:Circumstantial  Orientation:Partial  Thought Content:Scattered  History of Schizophrenia/Schizoaffective disorder:Yes  Duration of Psychotic Symptoms:Greater than six months  Hallucinations:No data recorded Ideas of Reference:None  Suicidal Thoughts:No data recorded Homicidal Thoughts:No data recorded  Sensorium  Memory:Remote Good  Judgment:Fair  Insight:Fair   Executive Functions  Concentration:Fair  Attention Span:Fair  Recall:Fair  Fund of  Knowledge:Fair  Language:Fair   Psychomotor Activity  Psychomotor Activity:No data recorded  Assets  Assets:Housing; Resilience; Social Support; Talents/Skills   Sleep  Sleep:No data recorded   Physical Exam: Physical Exam Vitals and nursing note reviewed.  Constitutional:      Appearance: Normal appearance.  HENT:     Head: Normocephalic and atraumatic.     Mouth/Throat:     Pharynx: Oropharynx is clear.  Eyes:     Pupils: Pupils are equal, round, and reactive to light.  Cardiovascular:     Rate and Rhythm: Normal rate and regular rhythm.  Pulmonary:     Effort: Pulmonary effort is normal.     Breath sounds: Normal breath sounds.  Abdominal:     General: Abdomen is flat.     Palpations: Abdomen is soft.  Musculoskeletal:        General: Normal range of motion.  Skin:    General: Skin is warm and dry.  Neurological:     General: No focal deficit present.     Mental Status: He is alert. Mental status is at baseline.  Psychiatric:        Attention and Perception: He is inattentive.        Mood and Affect: Affect is blunt.        Speech: He is noncommunicative.    Review of Systems  Unable to perform ROS: Language   Blood pressure (!) 124/91, pulse 96, temperature 99.3 F (37.4 C), temperature source Oral, resp. rate 16, height 5\' 8"  (1.727 m), weight 85.8 kg, SpO2 100 %. Body mass index is 28.76 kg/m.   Treatment Plan Summary: Plan no change to plan.  Continue medication.  Continue trying to work with the community team to find some sort of placement if possible  , MD 07/07/2022, 1:37 PM

## 2022-07-08 NOTE — Progress Notes (Signed)
  Problem: Education: Goal: Ability to state activities that reduce stress will improve Outcome: Progressing   Problem: Coping: Goal: Ability to identify and develop effective coping behavior will improve Outcome: Progressing   Problem: Self-Concept: Goal: Level of anxiety will decrease Outcome: Progressing   Problem: Nutrition: Goal: Adequate nutrition will be maintained Outcome: Progressing   Problem: Coping: Goal: Level of anxiety will decrease Outcome: Progressing   Problem: Safety: Goal: Ability to remain free from injury will improve Outcome: Progressing   Pt continues to pace and yell out, irritable;increased agitation noted. Pt was intrusive, while given other medications to others, pt was stepping infront of people, redirected to wait in line. Pt was banging on windows behind nursing station wanting toes painted. Prn IM Geodon given.

## 2022-07-08 NOTE — Progress Notes (Signed)
[facility] MD Progress Note  07/08/2022 11:59 AM Adam [name]  MRN:  [id number] Subjective: Follow-up patient with schizophrenia and aphasia.  No new complaints today.  They have behavior stable.  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 No pertinent surgical history. Family History: History reviewed. No pertinent family history. Family Psychiatric  History: See previous Social History:  Social History   Substance and Sexual Activity  Alcohol Use Not Currently     Social History   Substance and Sexual Activity  Drug Use Not Currently    Social History   Socioeconomic History   Marital status: Single    Spouse name: Not on file   Number of children: Not on file   Years of education: Not on file   Highest education level: Not on file  Occupational History   Not on file  Tobacco Use   Smoking status: Never    Passive exposure: Never   Smokeless tobacco: Never  Vaping Use   Vaping Use: Unknown  Substance and Sexual Activity   Alcohol use: Not Currently   Drug use: Not Currently   Sexual activity: Not Currently  Other Topics Concern   Not on file  Social History Narrative   Not on file   Social Determinants of Health   Financial Resource Strain: Not on file  Food Insecurity: Not on file  Transportation Needs: Not  on file  Physical Activity: Not on file  Stress: Not on file  Social Connections: Not on file   Additional Social History:  Specify valuables returned:  (none)                      Sleep: Fair  Appetite:  Negative  Current Medications: Current Facility-Administered Medications  Medication Dose Route Frequency Provider Last Rate Last Admin   acetaminophen (TYLENOL) tablet 650 mg  650 mg Oral Q6H PRN Shakendra Griffeth T, MD   650 mg at 06/20/22 1351   alum & mag hydroxide-simeth (MAALOX/MYLANTA) 200-200-20 MG/5ML suspension 30 mL  30 mL Oral Q4H PRN Jahzaria Vary T, MD   30 mL at 04/26/22 6295   aspirin EC tablet 81 mg  81 mg Oral Daily Rucker Pridgeon, [name], MD   81 mg at 07/08/22 0858   atorvastatin (LIPITOR) tablet 20 mg  20 mg Oral Daily Levis Nazir T, MD   20 mg at 07/08/22 0858   atropine 1 % ophthalmic solution 1 drop  1 drop Sublingual QID [name], MD   1 drop at 07/08/22 0900   benztropine (COGENTIN) tablet 0.5 mg  0.5 mg Oral BID Nairi Oswald T, MD   0.5 mg at 07/08/22 0858   clonazePAM (KLONOPIN) tablet 1 mg  1 mg Oral TID PRN He, [name], MD   1 mg at 07/06/22 2115   cloZAPine (CLOZARIL) tablet 300 mg  300 mg Oral QHS Ginelle Bays, [name], MD  300 mg at 07/07/22 2142   diphenhydrAMINE (BENADRYL) capsule 50 mg  50 mg Oral Q6H PRN Raylee Adamec, [name], MD   50 mg at 07/06/22 2706   Or   diphenhydrAMINE (BENADRYL) injection 50 mg  50 mg Intramuscular Q6H PRN Adaijah Endres, [name], MD       gabapentin (NEURONTIN) capsule 300 mg  300 mg Oral TID Oswald Pott, [name], MD   300 mg at 07/08/22 2376   haloperidol (HALDOL) tablet 5 mg  5 mg Oral TID Eathel Pajak, [name], MD   5 mg at 07/08/22 0901   haloperidol (HALDOL) tablet 5 mg  5 mg Oral Q6H PRN Maryon Kemnitz, [name], MD   5 mg at 07/06/22 2117   Or   haloperidol lactate (HALDOL) injection 5 mg  5 mg Intramuscular Q6H PRN Emoree Sasaki, [name], MD       magnesium hydroxide (MILK OF MAGNESIA) suspension 30 mL  30 mL Oral Daily PRN Jamese Trauger, [name], MD   30 mL at  05/02/22 2831   metoprolol succinate (TOPROL-XL) 24 hr tablet 25 mg  25 mg Oral Daily Antone Summons T, MD   25 mg at 07/08/22 0858   paliperidone (INVEGA SUSTENNA) injection 156 mg  156 mg Intramuscular Q28 days Shantrice Rodenberg T, MD   156 mg at 06/21/22 0917   temazepam (RESTORIL) capsule 15 mg  15 mg Oral QHS Dinia Joynt T, MD   15 mg at 07/07/22 2142   ziprasidone (GEODON) injection 20 mg  20 mg Intramuscular Q12H PRN Theone Bowell, [name], MD   20 mg at 06/24/22 1045    Lab Results:  Results for orders placed or performed during the hospital encounter of 03/19/22 (from the past 48 hour(s))  CBC with Differential/Platelet     Status: Abnormal   Collection Time: 07/06/22  2:07 PM  Result Value Ref Range   WBC 4.2 4.0 - 10.5 K/uL   RBC 4.58 4.22 - 5.81 MIL/uL   Hemoglobin 13.6 13.0 - 17.0 g/dL   HCT 51.7 61.6 - 07.3 %   MCV 86.9 80.0 - 100.0 fL   MCH 29.7 26.0 - 34.0 pg   MCHC 34.2 30.0 - 36.0 g/dL   RDW 71.0 62.6 - 94.8 %   Platelets 145 (L) 150 - 400 K/uL   nRBC 0.0 0.0 - 0.2 %   Neutrophils Relative % 60 %   Neutro Abs 2.5 1.7 - 7.7 K/uL   Lymphocytes Relative 30 %   Lymphs Abs 1.3 0.7 - 4.0 K/uL   Monocytes Relative 8 %   Monocytes Absolute 0.4 0.1 - 1.0 K/uL   Eosinophils Relative 1 %   Eosinophils Absolute 0.1 0.0 - 0.5 K/uL   Basophils Relative 1 %   Basophils Absolute 0.1 0.0 - 0.1 K/uL   Immature Granulocytes 0 %   Abs Immature Granulocytes 0.01 0.00 - 0.07 K/uL    Comment: Performed at [facility], [address] [place], [place] [address]    Blood Alcohol level:  Lab Results  Component Value Date   [facility] <10 07/20/2021    Metabolic Disorder Labs: Lab Results  Component Value Date   HGBA1C 5.5 04/10/2022   MPG 111.15 04/10/2022   No results found for: "PROLACTIN" Lab Results  Component Value Date   CHOL 167 11/20/2021   TRIG 107 11/20/2021   HDL 26 (L) 11/20/2021   CHOLHDL 6.4 11/20/2021   VLDL 21 11/20/2021   LDLCALC 120 (H) 11/20/2021     Physical Findings:  AIMS: Facial and Oral Movements Muscles of Facial Expression: None, normal Lips and Perioral Area: None, normal Jaw: None, normal Tongue: None, normal,Extremity Movements Upper (arms, wrists, hands, fingers): None, normal Lower (legs, knees, ankles, toes): None, normal, Trunk Movements Neck, shoulders, hips: None, normal, Overall Severity Severity of abnormal movements (highest score from questions above): None, normal Incapacitation due to abnormal movements: None, normal Patient's awareness of abnormal movements (rate only patient's report): No Awareness, Dental Status Current problems with teeth and/or dentures?: No Does patient usually wear dentures?: No  CIWA:    COWS:     Musculoskeletal: Strength & Muscle Tone: within normal limits Gait & Station: normal Patient leans: N/A  Psychiatric Specialty Exam:  Presentation  General Appearance: Appropriate for Environment; Casual; Neat; Well Groomed  Eye Contact:Fair  Speech:Slow  Speech Volume:Decreased  Handedness:Left   Mood and Affect  Mood:Anxious  Affect:Congruent   Thought Process  Thought Processes:Goal Directed  Descriptions of Associations:Circumstantial  Orientation:Partial  Thought Content:Scattered  History of Schizophrenia/Schizoaffective disorder:Yes  Duration of Psychotic Symptoms:Greater than six months  Hallucinations:No data recorded Ideas of Reference:None  Suicidal Thoughts:No data recorded Homicidal Thoughts:No data recorded  Sensorium  Memory:Remote Good  Judgment:Fair  Insight:Fair   Executive Functions  Concentration:Fair  Attention Span:Fair  Recall:Fair  Fund of Knowledge:Fair  Language:Fair   Psychomotor Activity  Psychomotor Activity:No data recorded  Assets  Assets:Housing; Resilience; Social Support; Talents/Skills   Sleep  Sleep:No data recorded   Physical Exam: Physical Exam Vitals and nursing note reviewed.   Constitutional:      Appearance: Normal appearance.  HENT:     Head: Normocephalic and atraumatic.     Mouth/Throat:     Pharynx: Oropharynx is clear.  Eyes:     Pupils: Pupils are equal, round, and reactive to light.  Cardiovascular:     Rate and Rhythm: Normal rate and regular rhythm.  Pulmonary:     Effort: Pulmonary effort is normal.     Breath sounds: Normal breath sounds.  Abdominal:     General: Abdomen is flat.     Palpations: Abdomen is soft.  Musculoskeletal:        General: Normal range of motion.  Skin:    General: Skin is warm and dry.  Neurological:     General: No focal deficit present.     Mental Status: He is alert. Mental status is at baseline.  Psychiatric:        Attention and Perception: He is inattentive.        Mood and Affect: Affect is blunt.        Speech: He is noncommunicative.    Review of Systems  Unable to perform ROS: Language   Blood pressure (!) 124/91, pulse 96, temperature 99.3 F (37.4 C), temperature source Oral, resp. rate 16, height 5\' 8"  (1.727 m), weight 85.8 kg, SpO2 100 %. Body mass index is 28.76 kg/m.   Treatment Plan Summary: Medication management and Plan no change to medicine management.  We will be coming up soon on the 1 year anniversary of his admission to the hospital.  For almost that entire time he has been considered psychiatrically and medically cleared and ready for discharge.  Still waiting for placement.  , MD 07/08/2022, 11:59 AM

## 2022-07-08 NOTE — Plan of Care (Incomplete)
  Problem: Education: Goal: Ability to state activities that reduce stress will improve Outcome: Progressing   Problem: Coping: Goal: Ability to identify and develop effective coping behavior will improve Outcome: Progressing   Problem: Self-Concept: Goal: Level of anxiety will decrease Outcome: Progressing   Problem: Nutrition: Goal: Adequate nutrition will be maintained Outcome: Progressing   Problem: Coping: Goal: Level of anxiety will decrease Outcome: Progressing   Problem: Safety: Goal: Ability to remain free from injury will improve Outcome: Progressing

## 2022-07-08 NOTE — B[facility] Group Notes (Signed)
LCSW Wellness Group Note   07/08/2022 1:00pm  Type of Group and Topic: Psychoeducational Group:  Wellness  Participation Level:  NA  Description of Group  Wellness group introduces the topic and its focus on developing healthy habits across the spectrum and its relationship to a decrease in hospital admissions.  Six areas of wellness are discussed: physical, social spiritual, intellectual, occupational, and emotional.  Patients are asked to consider their current wellness habits and to identify areas of wellness where they are interested and able to focus on improvements.    Therapeutic Goals Patients will understand components of wellness and how they can positively impact overall health.  Patients will identify areas of wellness where they have developed good habits. Patients will identify areas of wellness where they would like to make improvements.    Summary of Patient Progress: Pt initially came into the group room and sat down but left shortly after group began without making any comments.

## 2022-07-08 NOTE — Progress Notes (Signed)
Patient appears less anxious no distress noted denies SI/HI/AVH, he was calm this evening no loud outburst or restlessness, and he was complaint with medication regimen. 15 minutes safety checks will continue to monitor.  

## 2022-07-09 NOTE — Progress Notes (Signed)
Patient denies SI, HI, and AVH. He is compliant with scheduled medications. No behavioral issues observed. Patient remains safe on the unit at this time.

## 2022-07-09 NOTE — Progress Notes (Signed)
Patient appears less anxious no distress noted denies SI/HI/AVH, he was calm this evening no loud outburst or restlessness, and he was complaint with medication regimen. 15 minutes safety checks will continue to monitor.  

## 2022-07-09 NOTE — Progress Notes (Signed)
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 No pertinent surgical history. Family History: History reviewed. No pertinent family history. Family Psychiatric  History: None Social History:  Social History   Substance and Sexual Activity  Alcohol Use Not Currently     Social History   Substance and Sexual Activity  Drug Use Not Currently    Social History   Socioeconomic History   Marital status: Single    Spouse name: Not on file   Number of children: Not on file   Years of education: Not on file   Highest education level: Not on file  Occupational History   Not on file  Tobacco Use   Smoking status: Never    Passive exposure: Never   Smokeless tobacco: Never  Vaping Use   Vaping Use: Unknown  Substance and Sexual Activity   Alcohol use: Not Currently   Drug use: Not Currently   Sexual activity: Not Currently  Other Topics Concern   Not on file  Social History Narrative   Not on file   Social Determinants of Health   Financial Resource Strain: Not on file  Food Insecurity: Not on file  Transportation Needs: Not on file  Physical Activity: Not on file  Stress: Not on file  Social Connections: Not on file   Additional Social History:   Specify valuables returned:  (none)                      Sleep: Fair  Appetite:  Fair  Current Medications: Current Facility-Administered Medications  Medication Dose Route Frequency Provider Last Rate Last Admin   acetaminophen (TYLENOL) tablet 650 mg  650 mg Oral Q6H PRN Jalesha Plotz T, MD   650 mg at 06/20/22 1351   alum & mag hydroxide-simeth (MAALOX/MYLANTA) 200-200-20 MG/5ML suspension 30 mL  30 mL Oral Q4H PRN Edwina Grossberg T, MD   30 mL at 04/26/22 7106   aspirin EC tablet 81 mg  81 mg Oral Daily Merlie Noga, [name], MD   81 mg at 07/09/22 2694   atorvastatin (LIPITOR) tablet 20 mg  20 mg Oral Daily Shervon Kerwin T, MD   20 mg at 07/09/22 0907   atropine 1 % ophthalmic solution 1 drop  1 drop Sublingual QID [name], MD   1 drop at 07/09/22 1132   benztropine (COGENTIN) tablet 0.5 mg  0.5 mg Oral BID Shalea Tomczak T, MD   0.5 mg at 07/09/22 0907   clonazePAM (KLONOPIN) tablet 1 mg  1 mg Oral TID PRN He, [name], MD   1 mg at 07/06/22 2115   cloZAPine (CLOZARIL) tablet 300 mg  300 mg Oral QHS Max Nuno T, MD   300 mg at 07/08/22 2111   diphenhydrAMINE (BENADRYL) capsule 50 mg  50 mg Oral Q6H PRN  Whitman Meinhardt, [name], MD   50 mg at 07/06/22 3300   Or   diphenhydrAMINE (BENADRYL) injection 50 mg  50 mg Intramuscular Q6H PRN Barlow Harrison T, MD       gabapentin (NEURONTIN) capsule 300 mg  300 mg Oral TID Caulin Begley T, MD   300 mg at 07/09/22 1132   haloperidol (HALDOL) tablet 5 mg  5 mg Oral TID Juana Haralson T, MD   5 mg at 07/09/22 1132   haloperidol (HALDOL) tablet 5 mg  5 mg Oral Q6H PRN Dimitris Shanahan T, MD   5 mg at 07/06/22 2117   Or   haloperidol lactate (HALDOL) injection 5 mg  5 mg Intramuscular Q6H PRN Shaquil Aldana T, MD       magnesium hydroxide (MILK OF MAGNESIA) suspension 30 mL  30 mL Oral Daily PRN Arty Lantzy T, MD   30 mL at 05/02/22 7622   metoprolol succinate (TOPROL-XL) 24 hr tablet 25 mg  25 mg Oral Daily Bentlee Drier T, MD   25 mg at 07/09/22 0908    paliperidone (INVEGA SUSTENNA) injection 156 mg  156 mg Intramuscular Q28 days Azalea Cedar T, MD   156 mg at 06/21/22 0917   temazepam (RESTORIL) capsule 15 mg  15 mg Oral QHS Annelisa Ryback T, MD   15 mg at 07/08/22 2111   ziprasidone (GEODON) injection 20 mg  20 mg Intramuscular Q12H PRN Staisha Winiarski, [name], MD   20 mg at 07/08/22 1453    Lab Results: No results found for this or any previous visit (from the past 48 hour(s)).  Blood Alcohol level:  Lab Results  Component Value Date   ETH <10 07/20/2021    Metabolic Disorder Labs: Lab Results  Component Value Date   HGBA1C 5.5 04/10/2022   MPG 111.15 04/10/2022   No results found for: "PROLACTIN" Lab Results  Component Value Date   CHOL 167 11/20/2021   TRIG 107 11/20/2021   HDL 26 (L) 11/20/2021   CHOLHDL 6.4 11/20/2021   VLDL 21 11/20/2021   LDLCALC 120 (H) 11/20/2021    Physical Findings: AIMS: Facial and Oral Movements Muscles of Facial Expression: None, normal Lips and Perioral Area: None, normal Jaw: None, normal Tongue: None, normal,Extremity Movements Upper (arms, wrists, hands, fingers): None, normal Lower (legs, knees, ankles, toes): None, normal, Trunk Movements Neck, shoulders, hips: None, normal, Overall Severity Severity of abnormal movements (highest score from questions above): None, normal Incapacitation due to abnormal movements: None, normal Patient's awareness of abnormal movements (rate only patient's report): No Awareness, Dental Status Current problems with teeth and/or dentures?: No Does patient usually wear dentures?: No  CIWA:    COWS:     Musculoskeletal: Strength & Muscle Tone: within normal limits Gait & Station: normal Patient leans: N/A  Psychiatric Specialty Exam:  Presentation  General Appearance: Appropriate for Environment; Casual; Neat; Well Groomed  Eye Contact:Fair  Speech:Slow  Speech Volume:Decreased  Handedness:Left   Mood and Affect   Mood:Anxious  Affect:Congruent   Thought Process  Thought Processes:Goal Directed  Descriptions of Associations:Circumstantial  Orientation:Partial  Thought Content:Scattered  History of Schizophrenia/Schizoaffective disorder:Yes  Duration of Psychotic Symptoms:Greater than six months  Hallucinations:No data recorded Ideas of Reference:None  Suicidal Thoughts:No data recorded Homicidal Thoughts:No data recorded  Sensorium  Memory:Remote Good  Judgment:Fair  Insight:Fair   Executive Functions  Concentration:Fair  Attention Span:Fair  Recall:Fair  Fund of Knowledge:Fair  Language:Fair   Psychomotor Activity  Psychomotor Activity:No data recorded  Assets  Assets:Housing;  Resilience; Social Support; Talents/Skills   Sleep  Sleep:No data recorded   Physical Exam: Physical Exam Vitals and nursing note reviewed.  Constitutional:      Appearance: Normal appearance.  HENT:     Head: Normocephalic and atraumatic.     Mouth/Throat:     Pharynx: Oropharynx is clear.  Eyes:     Pupils: Pupils are equal, round, and reactive to light.  Cardiovascular:     Rate and Rhythm: Normal rate and regular rhythm.  Pulmonary:     Effort: Pulmonary effort is normal.     Breath sounds: Normal breath sounds.  Abdominal:     General: Abdomen is flat.     Palpations: Abdomen is soft.  Musculoskeletal:        General: Normal range of motion.  Skin:    General: Skin is warm and dry.  Neurological:     General: No focal deficit present.     Mental Status: He is alert. Mental status is at baseline.  Psychiatric:        Mood and Affect: Mood normal.    Review of Systems  Unable to perform ROS: Language   Blood pressure 121/86, pulse 96, temperature 98.2 F (36.8 C), temperature source Oral, resp. rate 16, height 5\' 8"  (1.727 m), weight 85.8 kg, SpO2 100 %. Body mass index is 28.76 kg/m.   Treatment Plan Summary: Medication management and Plan today's word  is "Joe Biden".  Patient is pleasant and calm no change to anything about his situation.  Continues to await placement  , MD 07/09/2022, 11:50 AM

## 2022-07-09 NOTE — Progress Notes (Signed)
Recreation Therapy Notes  Date: 07/09/2022   Time: 10:50 am     Location: Craft room    Behavioral response: N/A   Intervention Topic: Self-care    Discussion/Intervention: Patient refused to attend group.    Clinical Observations/Feedback:  Patient refused to attend group.    Ryheem Jay LRT/CTRS        Gayanne Prescott 07/09/2022 11:35 AM

## 2022-07-10 MED ORDER — LISINOPRIL 5 MG PO TABS
5.0000 mg | ORAL_TABLET | Freq: Every day | ORAL | Status: DC
  Administered 2022-07-10 – 2023-02-19 (×213): 5 mg via ORAL
  Filled 2022-07-10 (×224): qty 1

## 2022-07-10 NOTE — Progress Notes (Signed)
Recreation Therapy Notes   Date: 07/10/2022  Time: 10:15 am    Location: Craft room   Behavioral response: N/A   Intervention Topic: Problem Solving    Discussion/Intervention: Patient refused to attend group.   Clinical Observations/Feedback:  Patient refused to attend group.    Clydie Dillen LRT/CTRS        Milica Gully 07/10/2022 1:06 PM

## 2022-07-10 NOTE — Progress Notes (Signed)
[facility] MD Progress Note  07/10/2022 2:08 PM Adam [name]  MRN:  [id number] Subjective: Follow-up patient with schizophrenia.  Also aphasia.  No new complaints no changes to behavior.  Affect recently is generally seemed pretty upbeat. 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 No pertinent surgical history. Family History: History reviewed. No pertinent family history. Family Psychiatric  History: See previous Social History:  Social History   Substance and Sexual Activity  Alcohol Use Not Currently     Social History   Substance and Sexual Activity  Drug Use Not Currently    Social History   Socioeconomic History   Marital status: Single    Spouse name: Not on file   Number of children: Not on file   Years of education: Not on file   Highest education level: Not on file  Occupational History   Not on file  Tobacco Use   Smoking status: Never    Passive exposure: Never   Smokeless tobacco: Never  Vaping Use   Vaping Use: Unknown  Substance and Sexual Activity   Alcohol use: Not Currently   Drug use: Not Currently   Sexual activity: Not Currently  Other Topics Concern   Not on file  Social History Narrative   Not on file   Social Determinants of Health   Financial Resource Strain: Not on file  Food  Insecurity: Not on file  Transportation Needs: Not on file  Physical Activity: Not on file  Stress: Not on file  Social Connections: Not on file   Additional Social History:  Specify valuables returned:  (none)                      Sleep: Fair  Appetite:  Fair  Current Medications: Current Facility-Administered Medications  Medication Dose Route Frequency Provider Last Rate Last Admin   acetaminophen (TYLENOL) tablet 650 mg  650 mg Oral Q6H PRN Zooey Schreurs T, MD   650 mg at 06/20/22 1351   alum & mag hydroxide-simeth (MAALOX/MYLANTA) 200-200-20 MG/5ML suspension 30 mL  30 mL Oral Q4H PRN Jahnay Lantier T, MD   30 mL at 04/26/22 1829   aspirin EC tablet 81 mg  81 mg Oral Daily Giovanna Kemmerer T, MD   81 mg at 07/10/22 0855   atorvastatin (LIPITOR) tablet 20 mg  20 mg Oral Daily Ethridge Sollenberger T, MD   20 mg at 07/10/22 0855   atropine 1 % ophthalmic solution 1 drop  1 drop Sublingual QID [name], MD   1 drop at 07/10/22 1208   benztropine (COGENTIN) tablet 0.5 mg  0.5 mg Oral BID Vienna Folden T, MD   0.5 mg at 07/10/22 0855   clonazePAM (KLONOPIN) tablet 1 mg  1 mg Oral TID PRN He, [name], MD   1 mg at 07/06/22 2115   cloZAPine (CLOZARIL) tablet 300 mg  300  mg Oral QHS Mattis Featherly T, MD   300 mg at 07/09/22 2117   diphenhydrAMINE (BENADRYL) capsule 50 mg  50 mg Oral Q6H PRN Johnella Crumm T, MD   50 mg at 07/06/22 0803   Or   diphenhydrAMINE (BENADRYL) injection 50 mg  50 mg Intramuscular Q6H PRN Jaidy Cottam T, MD       gabapentin (NEURONTIN) capsule 300 mg  300 mg Oral TID Fernando Torry T, MD   300 mg at 07/10/22 1208   haloperidol (HALDOL) tablet 5 mg  5 mg Oral TID Sahvanna Mcmanigal T, MD   5 mg at 07/10/22 1207   haloperidol (HALDOL) tablet 5 mg  5 mg Oral Q6H PRN Riyanna Crutchley T, MD   5 mg at 07/06/22 2117   Or   haloperidol lactate (HALDOL) injection 5 mg  5 mg Intramuscular Q6H PRN Kweku Stankey T, MD       magnesium hydroxide (MILK OF MAGNESIA) suspension 30 mL  30 mL  Oral Daily PRN Brayson Livesey T, MD   30 mL at 05/02/22 9379   metoprolol succinate (TOPROL-XL) 24 hr tablet 25 mg  25 mg Oral Daily Eastyn Skalla T, MD   25 mg at 07/10/22 0855   paliperidone (INVEGA SUSTENNA) injection 156 mg  156 mg Intramuscular Q28 days Myrth Dahan T, MD   156 mg at 06/21/22 0917   temazepam (RESTORIL) capsule 15 mg  15 mg Oral QHS Sidni Fusco T, MD   15 mg at 07/09/22 2117   ziprasidone (GEODON) injection 20 mg  20 mg Intramuscular Q12H PRN Lorina Duffner, [name], MD   20 mg at 07/08/22 1453    Lab Results: No results found for this or any previous visit (from the past 48 hour(s)).  Blood Alcohol level:  Lab Results  Component Value Date   ETH <10 07/20/2021    Metabolic Disorder Labs: Lab Results  Component Value Date   HGBA1C 5.5 04/10/2022   MPG 111.15 04/10/2022   No results found for: "PROLACTIN" Lab Results  Component Value Date   CHOL 167 11/20/2021   TRIG 107 11/20/2021   HDL 26 (L) 11/20/2021   CHOLHDL 6.4 11/20/2021   VLDL 21 11/20/2021   LDLCALC 120 (H) 11/20/2021    Physical Findings: AIMS: Facial and Oral Movements Muscles of Facial Expression: None, normal Lips and Perioral Area: None, normal Jaw: None, normal Tongue: None, normal,Extremity Movements Upper (arms, wrists, hands, fingers): None, normal Lower (legs, knees, ankles, toes): None, normal, Trunk Movements Neck, shoulders, hips: None, normal, Overall Severity Severity of abnormal movements (highest score from questions above): None, normal Incapacitation due to abnormal movements: None, normal Patient's awareness of abnormal movements (rate only patient's report): No Awareness, Dental Status Current problems with teeth and/or dentures?: No Does patient usually wear dentures?: No  CIWA:    COWS:     Musculoskeletal: Strength & Muscle Tone: within normal limits Gait & Station: normal Patient leans: N/A  Psychiatric Specialty Exam:  Presentation  General Appearance:  Appropriate for Environment; Casual; Neat; Well Groomed  Eye Contact:Fair  Speech:Slow  Speech Volume:Decreased  Handedness:Left   Mood and Affect  Mood:Anxious  Affect:Congruent   Thought Process  Thought Processes:Goal Directed  Descriptions of Associations:Circumstantial  Orientation:Partial  Thought Content:Scattered  History of Schizophrenia/Schizoaffective disorder:Yes  Duration of Psychotic Symptoms:Greater than six months  Hallucinations:No data recorded Ideas of Reference:None  Suicidal Thoughts:No data recorded Homicidal Thoughts:No data recorded  Sensorium  Memory:Remote Good  Judgment:Fair  Insight:Fair   Executive  Functions  Concentration:Fair  Attention Span:Fair  Recall:Fair  Fund of Knowledge:Fair  Language:Fair   Psychomotor Activity  Psychomotor Activity:No data recorded  Assets  Assets:Housing; Resilience; Social Support; Talents/Skills   Sleep  Sleep:No data recorded   Physical Exam: Physical Exam Vitals and nursing note reviewed.  Constitutional:      Appearance: Normal appearance.  HENT:     Head: Normocephalic and atraumatic.     Mouth/Throat:     Pharynx: Oropharynx is clear.  Eyes:     Pupils: Pupils are equal, round, and reactive to light.  Cardiovascular:     Rate and Rhythm: Normal rate and regular rhythm.  Pulmonary:     Effort: Pulmonary effort is normal.     Breath sounds: Normal breath sounds.  Abdominal:     General: Abdomen is flat.     Palpations: Abdomen is soft.  Musculoskeletal:        General: Normal range of motion.  Skin:    General: Skin is warm and dry.  Neurological:     General: No focal deficit present.     Mental Status: He is alert. Mental status is at baseline.  Psychiatric:        Attention and Perception: Attention normal.        Mood and Affect: Mood normal. Affect is labile.        Speech: He is noncommunicative.    ROS Blood pressure (!) 157/112, pulse 90,  temperature 97.8 F (36.6 C), temperature source Oral, resp. rate 18, height 5\' 8"  (1.727 m), weight 85.8 kg, SpO2 100 %. Body mass index is 28.76 kg/m.   Treatment Plan Summary: Plan every now and then and the note it is worth pointing out that it is really outrageous that this patient remains in the hospital.  He has not had any significant behavior or his use in a long time.  He is really quite pleasant and easy to satisfy and would do great in a group home.  Nevertheless he seems to be condemned to continue living here indefinitely.  Also I noticed his blood pressure is up today and we may have to adjust some medication there.  , MD 07/10/2022, 2:08 PM

## 2022-07-10 NOTE — TOC Progression Note (Signed)
Transition of Care [facility]) - Progression Note    Patient Details  Name: Adam [name] MRN: [id number] Date of Birth: 1961-04-09  Transition of Care [facility]) CM/SW Contact  Margarito Liner, LCSW Phone Number: 07/10/2022, 11:01 AM  Clinical Narrative:   Spoke to Doristine Mango that owns patient's previous group home. Asked if he would be willing to come assess patient to see if they could take him back but he does not have any bed availability right now.   Expected Discharge Plan and Services                                                 Social Determinants of Health (SDOH) Interventions    Readmission Risk Interventions     No data to display

## 2022-07-10 NOTE — Plan of Care (Signed)
D- Patient alert and oriented to person and situation. Patient presented in a preoccupied, but pleasant mood on assessment and when this writer asked patient how he slept last night, he just frowned and turned his head, without answering. 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 Patient does say a few words, as well as repeats words that he hears others say. Patient denied SI, HI, AVH, and pain at this time. When this writer asked patient if he was experiencing any signs/symptoms of depression/anxiety, he stated "no".  A- Scheduled medications administered to patient, per MD orders. Support and encouragement provided.  Routine safety checks conducted every 15 minutes.  Patient informed to notify staff with problems or concerns.  R- No adverse drug reactions noted. Patient contracts for safety at this time. Patient compliant with medications and treatment plan. Patient receptive, calm, and cooperative. Patient interacts well with others on the unit.  Patient remains safe at this time.  Problem: Education: Goal: Ability to state activities that reduce stress will improve Outcome: Progressing   Problem: Coping: Goal: Ability to identify and develop effective coping behavior will improve Outcome: Progressing   Problem: Self-Concept: Goal: Ability to identify factors that promote anxiety will improve Outcome: Progressing Goal: Level of anxiety will decrease Outcome: Progressing Goal: Ability to modify response to factors that promote anxiety will improve Outcome: Progressing   Problem: Education: Goal: Knowledge of General Education information will improve Description: Including pain rating scale, medication(s)/side effects and non-pharmacologic comfort measures Outcome: Progressing   Problem: Health Behavior/Discharge Planning: Goal: Ability to manage health-related needs will improve Outcome: Progressing   Problem: Clinical Measurements: Goal: Ability  to maintain clinical measurements within normal limits will improve Outcome: Progressing Goal: Will remain free from infection Outcome: Progressing Goal: Diagnostic test results will improve Outcome: Progressing Goal: Respiratory complications will improve Outcome: Progressing Goal: Cardiovascular complication will be avoided Outcome: Progressing   Problem: Activity: Goal: Risk for activity intolerance will decrease Outcome: Progressing   Problem: Nutrition: Goal: Adequate nutrition will be maintained Outcome: Progressing   Problem: Coping: Goal: Level of anxiety will decrease Outcome: Progressing   Problem: Elimination: Goal: Will not experience complications related to bowel motility Outcome: Progressing Goal: Will not experience complications related to urinary retention Outcome: Progressing   Problem: Pain Managment: Goal: General experience of comfort will improve Outcome: Progressing   Problem: Safety: Goal: Ability to remain free from injury will improve Outcome: Progressing   Problem: Skin Integrity: Goal: Risk for impaired skin integrity will decrease Outcome: Progressing   Problem: Activity: Goal: Interest or engagement in leisure activities will improve Outcome: Progressing   Problem: Coping: Goal: Coping ability will improve Outcome: Progressing

## 2022-07-11 NOTE — Progress Notes (Signed)
Patient is in a pleasant mood.  Even better  mood since getting the shave he requested.  He is med compliant and received his qhs meds without incident. He denies si/hi/avh and anxiety.  He does endorse depression and wants to leave the hospital.  Encouragement and support offered.  Will continue to monitor with q15 minute safety checks.    C Butler-Nicholson, LPN

## 2022-07-11 NOTE — Progress Notes (Signed)
Patient at the nurses station.  He has torn of his armband and is requesting another one. He is in an upbeat pleasant mood.  He denies si/hi/avh anxiety and pain.  He does endorse depression. Med band printed and applied. Will continue to monitor.  Encouraged him to come to staffwoth amy more concerns.    C Butler-Nicholson, LPN

## 2022-07-11 NOTE — Plan of Care (Signed)
  Problem: Education: Goal: Ability to state activities that reduce stress will improve Outcome: Progressing   Problem: Coping: Goal: Ability to identify and develop effective coping behavior will improve Outcome: Progressing   Problem: Self-Concept: Goal: Ability to identify factors that promote anxiety will improve Outcome: Progressing Goal: Level of anxiety will decrease Outcome: Progressing Goal: Ability to modify response to factors that promote anxiety will improve Outcome: Progressing   Problem: Education: Goal: Knowledge of General Education information will improve Description: Including pain rating scale, medication(s)/side effects and non-pharmacologic comfort measures Outcome: Progressing   Problem: Coping: Goal: Coping ability will improve Outcome: Progressing   Problem: Activity: Goal: Interest or engagement in leisure activities will improve Outcome: Progressing   Problem: Skin Integrity: Goal: Risk for impaired skin integrity will decrease Outcome: Progressing   Problem: Safety: Goal: Ability to remain free from injury will improve Outcome: Progressing   Problem: Pain Managment: Goal: General experience of comfort will improve Outcome: Progressing

## 2022-07-11 NOTE — Progress Notes (Signed)
Recreation Therapy Notes    Date: 07/11/2022   Time: 10:30 am     Location: Court yard    Behavioral response: N/A   Intervention Topic: Decision Making  Discussion/Intervention: Patient refused to attend group.    Clinical Observations/Feedback:  Patient refused to attend group.    Elsbeth Yearick LRT/CTRS        Mourad Cwikla 07/11/2022 10:50 AM

## 2022-07-11 NOTE — Progress Notes (Signed)
[facility] MD Progress Note  07/11/2022 3:40 PM Adam [name]  MRN:  [id number] Subjective: Follow-up patient with schizophrenia and stroke.  No change to clinical condition.  No change to behavior.  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 No pertinent surgical history. Family History: History reviewed. No pertinent family history. Family Psychiatric  History: See previous Social History:  Social History   Substance and Sexual Activity  Alcohol Use Not Currently     Social History   Substance and Sexual Activity  Drug Use Not Currently    Social History   Socioeconomic History   Marital status: Single    Spouse name: Not on file   Number of children: Not on file   Years of education: Not on file   Highest education level: Not on file  Occupational History   Not on file  Tobacco Use   Smoking status: Never    Passive exposure: Never   Smokeless tobacco: Never  Vaping Use   Vaping Use: Unknown  Substance and Sexual Activity   Alcohol use: Not Currently   Drug use: Not Currently   Sexual activity: Not Currently  Other Topics Concern   Not on file  Social History Narrative   Not on file   Social Determinants of Health   Financial Resource Strain: Not on file  Food Insecurity: Not on file  Transportation Needs: Not on file  Physical Activity: Not  on file  Stress: Not on file  Social Connections: Not on file   Additional Social History:  Specify valuables returned:  (none)                      Sleep: Fair  Appetite:  Fair  Current Medications: Current Facility-Administered Medications  Medication Dose Route Frequency Provider Last Rate Last Admin   acetaminophen (TYLENOL) tablet 650 mg  650 mg Oral Q6H PRN [name] T, MD   650 mg at 06/20/22 1351   alum & mag hydroxide-simeth (MAALOX/MYLANTA) 200-200-20 MG/5ML suspension 30 mL  30 mL Oral Q4H PRN Idy Rawling T, MD   30 mL at 04/26/22 2979   aspirin EC tablet 81 mg  81 mg Oral Daily Valli Randol, [name], MD   81 mg at 07/11/22 8921   atorvastatin (LIPITOR) tablet 20 mg  20 mg Oral Daily Shayanna Thatch T, MD   20 mg at 07/11/22 1941   atropine 1 % ophthalmic solution 1 drop  1 drop Sublingual QID [name], MD   1 drop at 07/11/22 1219   benztropine (COGENTIN) tablet 0.5 mg  0.5 mg Oral BID Braydn Carneiro T, MD   0.5 mg at 07/11/22 7408   clonazePAM (KLONOPIN) tablet 1 mg  1 mg Oral TID PRN He, [name], MD   1 mg at 07/06/22 2115   cloZAPine (CLOZARIL) tablet 300 mg  300 mg Oral QHS Keriann Rankin, [name], MD   300 mg  at 07/10/22 2109   diphenhydrAMINE (BENADRYL) capsule 50 mg  50 mg Oral Q6H PRN Deidrea Gaetz, [name], MD   50 mg at 07/06/22 1497   Or   diphenhydrAMINE (BENADRYL) injection 50 mg  50 mg Intramuscular Q6H PRN Alvita Fana T, MD       gabapentin (NEURONTIN) capsule 300 mg  300 mg Oral TID Tequan Redmon T, MD   300 mg at 07/11/22 1219   haloperidol (HALDOL) tablet 5 mg  5 mg Oral TID Yi Falletta, [name], MD   5 mg at 07/11/22 1219   haloperidol (HALDOL) tablet 5 mg  5 mg Oral Q6H PRN Malisa Ruggiero, [name], MD   5 mg at 07/06/22 2117   Or   haloperidol lactate (HALDOL) injection 5 mg  5 mg Intramuscular Q6H PRN Osten Janek T, MD       lisinopril (ZESTRIL) tablet 5 mg  5 mg Oral Daily Katara Griner T, MD   5 mg at 07/11/22 0263   magnesium hydroxide (MILK OF MAGNESIA) suspension  30 mL  30 mL Oral Daily PRN Jeramey Lanuza T, MD   30 mL at 05/02/22 7858   metoprolol succinate (TOPROL-XL) 24 hr tablet 25 mg  25 mg Oral Daily Jerzee Jerome T, MD   25 mg at 07/11/22 8502   paliperidone (INVEGA SUSTENNA) injection 156 mg  156 mg Intramuscular Q28 days Keerthana Vanrossum T, MD   156 mg at 06/21/22 0917   temazepam (RESTORIL) capsule 15 mg  15 mg Oral QHS Said Rueb T, MD   15 mg at 07/10/22 2109   ziprasidone (GEODON) injection 20 mg  20 mg Intramuscular Q12H PRN Kellar Westberg, [name], MD   20 mg at 07/08/22 1453    Lab Results: No results found for this or any previous visit (from the past 48 hour(s)).  Blood Alcohol level:  Lab Results  Component Value Date   ETH <10 07/20/2021    Metabolic Disorder Labs: Lab Results  Component Value Date   HGBA1C 5.5 04/10/2022   MPG 111.15 04/10/2022   No results found for: "PROLACTIN" Lab Results  Component Value Date   CHOL 167 11/20/2021   TRIG 107 11/20/2021   HDL 26 (L) 11/20/2021   CHOLHDL 6.4 11/20/2021   VLDL 21 11/20/2021   LDLCALC 120 (H) 11/20/2021    Physical Findings: AIMS: Facial and Oral Movements Muscles of Facial Expression: None, normal Lips and Perioral Area: None, normal Jaw: None, normal Tongue: None, normal,Extremity Movements Upper (arms, wrists, hands, fingers): None, normal Lower (legs, knees, ankles, toes): None, normal, Trunk Movements Neck, shoulders, hips: None, normal, Overall Severity Severity of abnormal movements (highest score from questions above): None, normal Incapacitation due to abnormal movements: None, normal Patient's awareness of abnormal movements (rate only patient's report): No Awareness, Dental Status Current problems with teeth and/or dentures?: No Does patient usually wear dentures?: No  CIWA:    COWS:     Musculoskeletal: Strength & Muscle Tone: within normal limits Gait & Station: normal Patient leans: N/A  Psychiatric Specialty Exam:  Presentation  General  Appearance: Appropriate for Environment; Casual; Neat; Well Groomed  Eye Contact:Fair  Speech:Slow  Speech Volume:Decreased  Handedness:Left   Mood and Affect  Mood:Anxious  Affect:Congruent   Thought Process  Thought Processes:Goal Directed  Descriptions of Associations:Circumstantial  Orientation:Partial  Thought Content:Scattered  History of Schizophrenia/Schizoaffective disorder:Yes  Duration of Psychotic Symptoms:Greater than six months  Hallucinations:No data recorded Ideas of Reference:None  Suicidal Thoughts:No data recorded Homicidal Thoughts:No data recorded  Sensorium  Memory:Remote Good  Judgment:Fair  Insight:Fair   Executive Functions  Concentration:Fair  Attention Span:Fair  Recall:Fair  Fund of Knowledge:Fair  Language:Fair   Psychomotor Activity  Psychomotor Activity:No data recorded  Assets  Assets:Housing; Resilience; Social Support; Talents/Skills   Sleep  Sleep:No data recorded   Physical Exam: Physical Exam Vitals and nursing note reviewed.  Constitutional:      Appearance: Normal appearance.  HENT:     Head: Normocephalic and atraumatic.     Mouth/Throat:     Pharynx: Oropharynx is clear.  Eyes:     Pupils: Pupils are equal, round, and reactive to light.  Cardiovascular:     Rate and Rhythm: Normal rate and regular rhythm.  Pulmonary:     Effort: Pulmonary effort is normal.     Breath sounds: Normal breath sounds.  Abdominal:     General: Abdomen is flat.     Palpations: Abdomen is soft.  Musculoskeletal:        General: Normal range of motion.  Skin:    General: Skin is warm and dry.  Neurological:     General: No focal deficit present.     Mental Status: He is alert. Mental status is at baseline.  Psychiatric:        Attention and Perception: He is inattentive.        Mood and Affect: Mood is anxious.        Speech: He is noncommunicative.    Review of Systems  Unable to perform ROS: Language    Blood pressure 117/88, pulse 96, temperature 98.4 F (36.9 C), temperature source Oral, resp. rate 17, height 5\' 8"  (1.727 m), weight 85.8 kg, SpO2 100 %. Body mass index is 28.76 kg/m.   Treatment Plan Summary: Plan no change to treatment plan continue to wait for placement  , MD 07/11/2022, 3:40 PM

## 2022-07-11 NOTE — Progress Notes (Signed)
Patient received his bedtime medication without incident. Pleasant and cooperative. Reports having an overall good day today. Q 15 minute safety checks in place.  Will continue to monitor.      C Butler-Nicholson, LPN

## 2022-07-11 NOTE — Plan of Care (Signed)
  Problem: Education: Goal: Ability to state activities that reduce stress will improve Outcome: Progressing   Problem: Coping: Goal: Ability to identify and develop effective coping behavior will improve Outcome: Progressing   Problem: Self-Concept: Goal: Ability to identify factors that promote anxiety will improve Outcome: Progressing Goal: Level of anxiety will decrease Outcome: Progressing Goal: Ability to modify response to factors that promote anxiety will improve Outcome: Progressing   Problem: Coping: Goal: Coping ability will improve Outcome: Progressing   Problem: Activity: Goal: Interest or engagement in leisure activities will improve Outcome: Progressing   Problem: Skin Integrity: Goal: Risk for impaired skin integrity will decrease Outcome: Progressing

## 2022-07-11 NOTE — BHH Counselor (Signed)
CSW assisted patient in having the interview with Miss Annice Pih.  She reports that patient is not appropriate for her home, however she will provide CSW with come contact information for someone who may be able to assist.  CSW to follow up.  [name], MSW, LCSW 07/11/2022 2:52 PM

## 2022-07-11 NOTE — Plan of Care (Signed)
D: Pt alert and oriented. Pt denies experiencing any anxiety/depression at this time. Pt denies experiencing any pain at this time. Pt denies experiencing any SI/HI, or AVH at this time.   A: Scheduled medications administered to pt, per MD orders. Support and encouragement provided. Frequent verbal contact made. Routine safety checks conducted q15 minutes.   R: No adverse drug reactions noted. Pt verbally contracts for safety at this time. Pt compliant with medications and treatment plan. Pt interacts well with others on the unit. Pt remains safe at this time. Will continue to monitor.   Problem: Self-Concept: Goal: Level of anxiety will decrease Outcome: Progressing   Problem: Activity: Goal: Risk for activity intolerance will decrease Outcome: Progressing

## 2022-07-12 LAB — CBC WITH DIFFERENTIAL/PLATELET
Abs Immature Granulocytes: 0.01 10*3/uL (ref 0.00–0.07)
Basophils Absolute: 0.1 10*3/uL (ref 0.0–0.1)
Basophils Relative: 1 %
Eosinophils Absolute: 0 10*3/uL (ref 0.0–0.5)
Eosinophils Relative: 1 %
HCT: 40 % (ref 39.0–52.0)
Hemoglobin: 13.8 g/dL (ref 13.0–17.0)
Immature Granulocytes: 0 %
Lymphocytes Relative: 35 %
Lymphs Abs: 1.4 10*3/uL (ref 0.7–4.0)
MCH: 29.6 pg (ref 26.0–34.0)
MCHC: 34.5 g/dL (ref 30.0–36.0)
MCV: 85.8 fL (ref 80.0–100.0)
Monocytes Absolute: 0.4 10*3/uL (ref 0.1–1.0)
Monocytes Relative: 10 %
Neutro Abs: 2.2 10*3/uL (ref 1.7–7.7)
Neutrophils Relative %: 53 %
Platelets: 143 10*3/uL — ABNORMAL LOW (ref 150–400)
RBC: 4.66 MIL/uL (ref 4.22–5.81)
RDW: 13.4 % (ref 11.5–15.5)
WBC: 4.1 10*3/uL (ref 4.0–10.5)
nRBC: 0 % (ref 0.0–0.2)

## 2022-07-12 NOTE — Plan of Care (Signed)
    D:  Patient alert and oriented.  Patient walking in hallway stating, "No way, No way."   Patient irritable. Denies SI/HI.  Denies AVH.  Denies pain.  Patient mood improved throughout the day.  Patient could be heard signing.    A:  Medications administered as ordered by Ivonne Andrew, RN. Emotional support and encouragement offered.  Frequent verbal interaction with patient.  Q15 minute checks in place for safety.   R:  Patient compliant with medications.  No adverse reactions noted.  Patient contracts for safety. Pleasant interaction with staff. Patient remains safe on the unit.

## 2022-07-12 NOTE — Progress Notes (Signed)
Patient sleeping at this time.  Medications will be administered once patient wakes.

## 2022-07-12 NOTE — Progress Notes (Signed)
[facility] MD Progress Note  07/12/2022 4:20 PM Adam [name]  MRN:  [id number] Subjective: Follow-up patient with schizophrenia and aphasia.  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 No pertinent surgical history. Family History: History reviewed. No pertinent family history. Family Psychiatric  History: See previous Social History:  Social History   Substance and Sexual Activity  Alcohol Use Not Currently     Social History   Substance and Sexual Activity  Drug Use Not Currently    Social History   Socioeconomic History   Marital status: Single    Spouse name: Not on file   Number of children: Not on file   Years of education: Not on file   Highest education level: Not on file  Occupational History   Not on file  Tobacco Use   Smoking status: Never    Passive exposure: Never   Smokeless tobacco: Never  Vaping Use   Vaping Use: Unknown  Substance and Sexual Activity   Alcohol use: Not Currently   Drug use: Not Currently   Sexual activity: Not Currently  Other Topics Concern   Not on file  Social History Narrative   Not on file   Social Determinants of Health   Financial Resource Strain: Not on file  Food Insecurity: Not on file  Transportation Needs: Not on file  Physical Activity: Not on file  Stress: Not on file   Social Connections: Not on file   Additional Social History:  Specify valuables returned:  (none)                      Sleep: Fair  Appetite:  Negative  Current Medications: Current Facility-Administered Medications  Medication Dose Route Frequency Provider Last Rate Last Admin   acetaminophen (TYLENOL) tablet 650 mg  650 mg Oral Q6H PRN Braelee Herrle T, MD   650 mg at 06/20/22 1351   alum & mag hydroxide-simeth (MAALOX/MYLANTA) 200-200-20 MG/5ML suspension 30 mL  30 mL Oral Q4H PRN Aniyla Harling T, MD   30 mL at 04/26/22 [id number]   aspirin EC tablet 81 mg  81 mg Oral Daily Haig Gerardo, [name], MD   81 mg at 07/12/22 0829   atorvastatin (LIPITOR) tablet 20 mg  20 mg Oral Daily Leonda Cristo T, MD   20 mg at 07/12/22 0829   atropine 1 % ophthalmic solution 1 drop  1 drop Sublingual QID [name], MD   1 drop at 07/12/22 1310   benztropine (COGENTIN) tablet 0.5 mg  0.5 mg Oral BID Noell Shular T, MD   0.5 mg at 07/12/22 0829   clonazePAM (KLONOPIN) tablet 1 mg  1 mg Oral TID PRN He, [name], MD   1 mg at 07/12/22 0831   cloZAPine (CLOZARIL) tablet 300 mg  300 mg Oral QHS Anthonie Lotito T, MD   300 mg at 07/11/22 2112   diphenhydrAMINE (  BENADRYL) capsule 50 mg  50 mg Oral Q6H PRN Jeralyn Nolden, [name], MD   50 mg at 07/06/22 2595   Or   diphenhydrAMINE (BENADRYL) injection 50 mg  50 mg Intramuscular Q6H PRN Tiberius Loftus, [name], MD       gabapentin (NEURONTIN) capsule 300 mg  300 mg Oral TID Gabrielle Mester, [name], MD   300 mg at 07/12/22 1310   haloperidol (HALDOL) tablet 5 mg  5 mg Oral TID Zamyah Wiesman, [name], MD   5 mg at 07/12/22 1310   haloperidol (HALDOL) tablet 5 mg  5 mg Oral Q6H PRN Odies Desa, [name], MD   5 mg at 07/06/22 2117   Or   haloperidol lactate (HALDOL) injection 5 mg  5 mg Intramuscular Q6H PRN Clarabel Marion, [name], MD       lisinopril (ZESTRIL) tablet 5 mg  5 mg Oral Daily Aniqa Hare, [name], MD   5 mg at 07/12/22 0829   magnesium hydroxide (MILK OF MAGNESIA) suspension 30 mL  30 mL Oral Daily PRN  Blessin Kanno T, MD   30 mL at 05/02/22 6387   metoprolol succinate (TOPROL-XL) 24 hr tablet 25 mg  25 mg Oral Daily Tywon Niday T, MD   25 mg at 07/12/22 0829   paliperidone (INVEGA SUSTENNA) injection 156 mg  156 mg Intramuscular Q28 days Devina Bezold T, MD   156 mg at 06/21/22 0917   temazepam (RESTORIL) capsule 15 mg  15 mg Oral QHS Kellin Fifer T, MD   15 mg at 07/11/22 2112   ziprasidone (GEODON) injection 20 mg  20 mg Intramuscular Q12H PRN Ivis Henneman, [name], MD   20 mg at 07/08/22 1453    Lab Results:  Results for orders placed or performed during the hospital encounter of 03/19/22 (from the past 48 hour(s))  CBC with Differential/Platelet     Status: Abnormal   Collection Time: 07/12/22  9:28 AM  Result Value Ref Range   WBC 4.1 4.0 - 10.5 K/uL   RBC 4.66 4.22 - 5.81 MIL/uL   Hemoglobin 13.8 13.0 - 17.0 g/dL   HCT 56.4 33.2 - 95.1 %   MCV 85.8 80.0 - 100.0 fL   MCH 29.6 26.0 - 34.0 pg   MCHC 34.5 30.0 - 36.0 g/dL   RDW 88.4 16.6 - 06.3 %   Platelets 143 (L) 150 - 400 K/uL   nRBC 0.0 0.0 - 0.2 %   Neutrophils Relative % 53 %   Neutro Abs 2.2 1.7 - 7.7 K/uL   Lymphocytes Relative 35 %   Lymphs Abs 1.4 0.7 - 4.0 K/uL   Monocytes Relative 10 %   Monocytes Absolute 0.4 0.1 - 1.0 K/uL   Eosinophils Relative 1 %   Eosinophils Absolute 0.0 0.0 - 0.5 K/uL   Basophils Relative 1 %   Basophils Absolute 0.1 0.0 - 0.1 K/uL   Immature Granulocytes 0 %   Abs Immature Granulocytes 0.01 0.00 - 0.07 K/uL    Comment: Performed at [facility], [address] [place], [place] [address]    Blood Alcohol level:  Lab Results  Component Value Date   [facility] <10 07/20/2021    Metabolic Disorder Labs: Lab Results  Component Value Date   HGBA1C 5.5 04/10/2022   MPG 111.15 04/10/2022   No results found for: "PROLACTIN" Lab Results  Component Value Date   CHOL 167 11/20/2021   TRIG 107 11/20/2021   HDL 26 (L) 11/20/2021   CHOLHDL 6.4 11/20/2021  VLDL 21 11/20/2021    LDLCALC 120 (H) 11/20/2021    Physical Findings: AIMS: Facial and Oral Movements Muscles of Facial Expression: None, normal Lips and Perioral Area: None, normal Jaw: None, normal Tongue: None, normal,Extremity Movements Upper (arms, wrists, hands, fingers): None, normal Lower (legs, knees, ankles, toes): None, normal, Trunk Movements Neck, shoulders, hips: None, normal, Overall Severity Severity of abnormal movements (highest score from questions above): None, normal Incapacitation due to abnormal movements: None, normal Patient's awareness of abnormal movements (rate only patient's report): No Awareness, Dental Status Current problems with teeth and/or dentures?: No Does patient usually wear dentures?: No  CIWA:    COWS:     Musculoskeletal: Strength & Muscle Tone: within normal limits Gait & Station: normal Patient leans: N/A  Psychiatric Specialty Exam:  Presentation  General Appearance: Appropriate for Environment; Casual; Neat; Well Groomed  Eye Contact:Fair  Speech:Slow  Speech Volume:Decreased  Handedness:Left   Mood and Affect  Mood:Anxious  Affect:Congruent   Thought Process  Thought Processes:Goal Directed  Descriptions of Associations:Circumstantial  Orientation:Partial  Thought Content:Scattered  History of Schizophrenia/Schizoaffective disorder:Yes  Duration of Psychotic Symptoms:Greater than six months  Hallucinations:No data recorded Ideas of Reference:None  Suicidal Thoughts:No data recorded Homicidal Thoughts:No data recorded  Sensorium  Memory:Remote Good  Judgment:Fair  Insight:Fair   Executive Functions  Concentration:Fair  Attention Span:Fair  Recall:Fair  Fund of Knowledge:Fair  Language:Fair   Psychomotor Activity  Psychomotor Activity:No data recorded  Assets  Assets:Housing; Resilience; Social Support; Talents/Skills   Sleep  Sleep:No data recorded   Physical Exam: Physical Exam Vitals and  nursing note reviewed.  Constitutional:      Appearance: Normal appearance.  HENT:     Head: Normocephalic and atraumatic.     Mouth/Throat:     Pharynx: Oropharynx is clear.  Eyes:     Pupils: Pupils are equal, round, and reactive to light.  Cardiovascular:     Rate and Rhythm: Normal rate and regular rhythm.  Pulmonary:     Effort: Pulmonary effort is normal.     Breath sounds: Normal breath sounds.  Abdominal:     General: Abdomen is flat.     Palpations: Abdomen is soft.  Musculoskeletal:        General: Normal range of motion.  Skin:    General: Skin is warm and dry.  Neurological:     General: No focal deficit present.     Mental Status: He is alert. Mental status is at baseline.  Psychiatric:        Mood and Affect: Mood normal.        Speech: He is noncommunicative.    Review of Systems  Unable to perform ROS: Language   Blood pressure 126/84, pulse 88, temperature 98.3 F (36.8 C), temperature source Oral, resp. rate 18, height 5\' 8"  (1.727 m), weight 85.8 kg, SpO2 99 %. Body mass index is 28.76 kg/m.   Treatment Plan Summary: Plan no change at all to treatment plan continue working on discharge  , MD 07/12/2022, 4:20 PM

## 2022-07-12 NOTE — Progress Notes (Signed)
Pharmacy - Clozapine     This patient's order has been reviewed for prescribing contraindications.   Clozapine REMS enrollment Verified: yes Current Outpatient Monitoring: weekly  Home Regimen:    Dose Adjustments This Admission:   Labs: 7/26 ANC 2500 K/uL 8/2 ANC 2200 8/9 ANC 2200   Plan: Lab submitted to clozapine REMS Continue with weekly ANC labs while inpatient   **The medication is being dispensed pursuant to the FDA REMS suspension order of 10/21/20 that allows for dispensing without a patient REMS dispense authorization (RDA).    Bettey Costa, PharmD Clinical Pharmacist 07/12/2022 10:40 AM

## 2022-07-12 NOTE — Progress Notes (Signed)
Recreation Therapy Notes  Date: 07/12/2022   Time: 9:40 am     Location: Craft room     Behavioral response: N/A   Intervention Topic: Life Planning   Discussion/Intervention: Patient refused to attend group.    Clinical Observations/Feedback:  Patient refused to attend group.    Adam [name] LRT/CTRS        Taniqua Issa 07/12/2022 10:19 AM

## 2022-07-12 NOTE — BHH Counselor (Signed)
CSW spoke with [place] at [facility].  CSW inquired if patient could return to his previous placement there.  Archie Patten reports that at this time the patient is not able to return "indefinitely".    She reports that the patient was physically aggressive and "walking around and slapping" everyone.    CSW pointed out that patient's medications have been adjusted, and he has been on the unit for 115 days with no acts of physical aggression to others.    Tonya reiterated that at this time the patient can not be considered.   [name], MSW, LCSW 07/12/2022 12:53 PM

## 2022-07-13 NOTE — Progress Notes (Signed)
[facility] MD Progress Note  07/13/2022 4:08 PM Adam [name]  MRN:  [id number] Subjective: No change for this patient with schizophrenia and aphasia.  No new complaints.  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 No pertinent surgical history. Family History: History reviewed. No pertinent family history. Family Psychiatric  History: See previous Social History:  Social History   Substance and Sexual Activity  Alcohol Use Not Currently     Social History   Substance and Sexual Activity  Drug Use Not Currently    Social History   Socioeconomic History   Marital status: Single    Spouse name: Not on file   Number of children: Not on file   Years of education: Not on file   Highest education level: Not on file  Occupational History   Not on file  Tobacco Use   Smoking status: Never    Passive exposure: Never   Smokeless tobacco: Never  Vaping Use   Vaping Use: Unknown  Substance and Sexual Activity   Alcohol use: Not Currently   Drug use: Not Currently   Sexual activity: Not Currently  Other Topics Concern   Not on file  Social History Narrative   Not on file   Social Determinants of Health   Financial Resource Strain: Not on file  Food Insecurity: Not on file  Transportation Needs: Not on file  Physical Activity: Not on file  Stress: Not on file  Social  Connections: Not on file   Additional Social History:  Specify valuables returned:  (none)                      Sleep: Fair  Appetite:  Fair  Current Medications: Current Facility-Administered Medications  Medication Dose Route Frequency Provider Last Rate Last Admin   acetaminophen (TYLENOL) tablet 650 mg  650 mg Oral Q6H PRN Verina Galeno T, MD   650 mg at 06/20/22 1351   alum & mag hydroxide-simeth (MAALOX/MYLANTA) 200-200-20 MG/5ML suspension 30 mL  30 mL Oral Q4H PRN Kalem Rockwell T, MD   30 mL at 04/26/22 8657   aspirin EC tablet 81 mg  81 mg Oral Daily Nykayla Marcelli, [name], MD   81 mg at 07/13/22 8469   atorvastatin (LIPITOR) tablet 20 mg  20 mg Oral Daily Mazikeen Hehn T, MD   20 mg at 07/13/22 0832   atropine 1 % ophthalmic solution 1 drop  1 drop Sublingual QID [name], MD   1 drop at 07/13/22 1237   benztropine (COGENTIN) tablet 0.5 mg  0.5 mg Oral BID Arjay Jaskiewicz T, MD   0.5 mg at 07/13/22 6295   clonazePAM (KLONOPIN) tablet 1 mg  1 mg Oral TID PRN He, [name], MD   1 mg at 07/12/22 0831   cloZAPine (CLOZARIL) tablet 300 mg  300 mg Oral QHS Candiss Galeana T, MD   300 mg at 07/12/22 2131  diphenhydrAMINE (BENADRYL) capsule 50 mg  50 mg Oral Q6H PRN Delcia Spitzley, [name], MD   50 mg at 07/06/22 0160   Or   diphenhydrAMINE (BENADRYL) injection 50 mg  50 mg Intramuscular Q6H PRN Shantera Monts, [name], MD       gabapentin (NEURONTIN) capsule 300 mg  300 mg Oral TID Tirrell Buchberger, [name], MD   300 mg at 07/13/22 1237   haloperidol (HALDOL) tablet 5 mg  5 mg Oral TID Judith Demps, [name], MD   5 mg at 07/13/22 1237   haloperidol (HALDOL) tablet 5 mg  5 mg Oral Q6H PRN Lutisha Knoche, [name], MD   5 mg at 07/06/22 2117   Or   haloperidol lactate (HALDOL) injection 5 mg  5 mg Intramuscular Q6H PRN Mahsa Hanser, [name], MD       lisinopril (ZESTRIL) tablet 5 mg  5 mg Oral Daily Dawson Hollman, [name], MD   5 mg at 07/13/22 1093   magnesium hydroxide (MILK OF MAGNESIA) suspension 30 mL  30 mL Oral Daily PRN Ayman Brull,  Marina Desire T, MD   30 mL at 05/02/22 2355   metoprolol succinate (TOPROL-XL) 24 hr tablet 25 mg  25 mg Oral Daily Augusta Mirkin T, MD   25 mg at 07/13/22 0832   paliperidone (INVEGA SUSTENNA) injection 156 mg  156 mg Intramuscular Q28 days Harlee Pursifull T, MD   156 mg at 06/21/22 0917   temazepam (RESTORIL) capsule 15 mg  15 mg Oral QHS Michio Thier T, MD   15 mg at 07/12/22 2131   ziprasidone (GEODON) injection 20 mg  20 mg Intramuscular Q12H PRN Tyran Huser, [name], MD   20 mg at 07/08/22 1453    Lab Results:  Results for orders placed or performed during the hospital encounter of 03/19/22 (from the past 48 hour(s))  CBC with Differential/Platelet     Status: Abnormal   Collection Time: 07/12/22  9:28 AM  Result Value Ref Range   WBC 4.1 4.0 - 10.5 K/uL   RBC 4.66 4.22 - 5.81 MIL/uL   Hemoglobin 13.8 13.0 - 17.0 g/dL   HCT 73.2 20.2 - 54.2 %   MCV 85.8 80.0 - 100.0 fL   MCH 29.6 26.0 - 34.0 pg   MCHC 34.5 30.0 - 36.0 g/dL   RDW 70.6 23.7 - 62.8 %   Platelets 143 (L) 150 - 400 K/uL   nRBC 0.0 0.0 - 0.2 %   Neutrophils Relative % 53 %   Neutro Abs 2.2 1.7 - 7.7 K/uL   Lymphocytes Relative 35 %   Lymphs Abs 1.4 0.7 - 4.0 K/uL   Monocytes Relative 10 %   Monocytes Absolute 0.4 0.1 - 1.0 K/uL   Eosinophils Relative 1 %   Eosinophils Absolute 0.0 0.0 - 0.5 K/uL   Basophils Relative 1 %   Basophils Absolute 0.1 0.0 - 0.1 K/uL   Immature Granulocytes 0 %   Abs Immature Granulocytes 0.01 0.00 - 0.07 K/uL    Comment: Performed at [facility], [address] [place], [place] [address]    Blood Alcohol level:  Lab Results  Component Value Date   [facility] <10 07/20/2021    Metabolic Disorder Labs: Lab Results  Component Value Date   HGBA1C 5.5 04/10/2022   MPG 111.15 04/10/2022   No results found for: "PROLACTIN" Lab Results  Component Value Date   CHOL 167 11/20/2021   TRIG 107 11/20/2021   HDL 26 (L) 11/20/2021   CHOLHDL 6.4 11/20/2021  VLDL 21 11/20/2021   LDLCALC 120  (H) 11/20/2021    Physical Findings: AIMS: Facial and Oral Movements Muscles of Facial Expression: None, normal Lips and Perioral Area: None, normal Jaw: None, normal Tongue: None, normal,Extremity Movements Upper (arms, wrists, hands, fingers): None, normal Lower (legs, knees, ankles, toes): None, normal, Trunk Movements Neck, shoulders, hips: None, normal, Overall Severity Severity of abnormal movements (highest score from questions above): None, normal Incapacitation due to abnormal movements: None, normal Patient's awareness of abnormal movements (rate only patient's report): No Awareness, Dental Status Current problems with teeth and/or dentures?: No Does patient usually wear dentures?: No  CIWA:    COWS:     Musculoskeletal: Strength & Muscle Tone: within normal limits Gait & Station: normal Patient leans: N/A  Psychiatric Specialty Exam:  Presentation  General Appearance: Appropriate for Environment; Casual; Neat; Well Groomed  Eye Contact:Fair  Speech:Slow  Speech Volume:Decreased  Handedness:Left   Mood and Affect  Mood:Anxious  Affect:Congruent   Thought Process  Thought Processes:Goal Directed  Descriptions of Associations:Circumstantial  Orientation:Partial  Thought Content:Scattered  History of Schizophrenia/Schizoaffective disorder:Yes  Duration of Psychotic Symptoms:Greater than six months  Hallucinations:No data recorded Ideas of Reference:None  Suicidal Thoughts:No data recorded Homicidal Thoughts:No data recorded  Sensorium  Memory:Remote Good  Judgment:Fair  Insight:Fair   Executive Functions  Concentration:Fair  Attention Span:Fair  Recall:Fair  Fund of Knowledge:Fair  Language:Fair   Psychomotor Activity  Psychomotor Activity:No data recorded  Assets  Assets:Housing; Resilience; Social Support; Talents/Skills   Sleep  Sleep:No data recorded   Physical Exam: Physical Exam Vitals and nursing note  reviewed.  Constitutional:      Appearance: Normal appearance.  HENT:     Head: Normocephalic and atraumatic.     Mouth/Throat:     Pharynx: Oropharynx is clear.  Eyes:     Pupils: Pupils are equal, round, and reactive to light.  Cardiovascular:     Rate and Rhythm: Normal rate and regular rhythm.  Pulmonary:     Effort: Pulmonary effort is normal.     Breath sounds: Normal breath sounds.  Abdominal:     General: Abdomen is flat.     Palpations: Abdomen is soft.  Musculoskeletal:        General: Normal range of motion.  Skin:    General: Skin is warm and dry.  Neurological:     General: No focal deficit present.     Mental Status: He is alert. Mental status is at baseline.  Psychiatric:        Mood and Affect: Mood normal.        Thought Content: Thought content normal.    Review of Systems  Unable to perform ROS: Language   Blood pressure 116/77, pulse 99, temperature 98.3 F (36.8 C), temperature source Oral, resp. rate 18, height 5\' 8"  (1.727 m), weight 85.8 kg, SpO2 100 %. Body mass index is 28.76 kg/m.   Treatment Plan Summary: Plan no change to treatment plan.  Hoping some day to have a discharge  , MD 07/13/2022, 4:08 PM

## 2022-07-13 NOTE — Progress Notes (Signed)
Patient has been fixated on Amy, RN to where he has been yelling "no way", every time he sees her.

## 2022-07-13 NOTE — Plan of Care (Signed)
D- Patient alert and oriented to person and situation. Patient initially presented in an agitated mood, with another nurse on the floor, but when this writer took over patient's care, his mood brightened up. 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 Patient does say a few words, as well as repeats some words that he hears others say. When this writer asked patient how he slept last night, he said "good", and had no complaints to voice to this Clinical research associate. Patient denied SI, HI, AVH, and pain at this time. Patient also denies any signs/symptoms of depression and anxiety. Patient had no stated goals for today.  A- Scheduled medications administered to patient, per MD orders. Support and encouragement provided.  Routine safety checks conducted every 15 minutes.  Patient informed to notify staff with problems or concerns.  R- No adverse drug reactions noted. Patient contracts for safety at this time. Patient compliant with medications and treatment plan. Patient receptive, calm, and cooperative. Patient remains safe at this time.  Problem: Education: Goal: Ability to state activities that reduce stress will improve Outcome: Progressing   Problem: Coping: Goal: Ability to identify and develop effective coping behavior will improve Outcome: Progressing   Problem: Self-Concept: Goal: Ability to identify factors that promote anxiety will improve Outcome: Progressing Goal: Level of anxiety will decrease Outcome: Progressing Goal: Ability to modify response to factors that promote anxiety will improve Outcome: Progressing   Problem: Education: Goal: Knowledge of General Education information will improve Description: Including pain rating scale, medication(s)/side effects and non-pharmacologic comfort measures Outcome: Progressing   Problem: Health Behavior/Discharge Planning: Goal: Ability to manage health-related needs will improve Outcome: Progressing   Problem:  Clinical Measurements: Goal: Ability to maintain clinical measurements within normal limits will improve Outcome: Progressing Goal: Will remain free from infection Outcome: Progressing Goal: Diagnostic test results will improve Outcome: Progressing Goal: Respiratory complications will improve Outcome: Progressing Goal: Cardiovascular complication will be avoided Outcome: Progressing   Problem: Activity: Goal: Risk for activity intolerance will decrease Outcome: Progressing   Problem: Nutrition: Goal: Adequate nutrition will be maintained Outcome: Progressing   Problem: Coping: Goal: Level of anxiety will decrease Outcome: Progressing   Problem: Elimination: Goal: Will not experience complications related to bowel motility Outcome: Progressing Goal: Will not experience complications related to urinary retention Outcome: Progressing   Problem: Pain Managment: Goal: General experience of comfort will improve Outcome: Progressing   Problem: Safety: Goal: Ability to remain free from injury will improve Outcome: Progressing   Problem: Skin Integrity: Goal: Risk for impaired skin integrity will decrease Outcome: Progressing   Problem: Activity: Goal: Interest or engagement in leisure activities will improve Outcome: Progressing   Problem: Coping: Goal: Coping ability will improve Outcome: Progressing

## 2022-07-13 NOTE — Progress Notes (Signed)
Patient came up to the nurses station with shaving cream, pointing at this Clinical research associate. This writer went to shave patient, in which he tolerated it well, without any issues. Patient remains safe on the unit and is now in his room singing out, loudly, apparently in a good mood.

## 2022-07-13 NOTE — Progress Notes (Signed)
Patient is active on the unit.  Has been singing loudly in his room and in the halls.  He enjoys being around the other patients and tries to interact.  He is med compliant and received his meds without incident. He denies si/hi/avh depression and anxiety at this encounter. Will continue to monitor with q15 minute safety checks.     C Butler-Nicholson, LPN

## 2022-07-14 NOTE — Progress Notes (Signed)
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  Interval History Patient was seen today for re-evaluation.  Nursing reports patient is stable and compliant with medications.   Subjective:   Patient says "Good!" And shows thumbs up. Minimally-verbal. Calm. Denies any complaints. Reports doing "good". He is not appear to be in any physical discomfort. Smiles.  Total Time spent with patient: 15 minutes  Past Psychiatric History: see H&P  Past Medical History:  Past Medical History:  Diagnosis Date   Myocardial infarction (HCC)    Schizophrenia (HCC)    Stroke (HCC)    TBI (traumatic brain injury) (HCC)    History reviewed. No pertinent surgical history. Family History: History reviewed. No pertinent family history. Family Psychiatric  History:  Social History:  Social History   Substance and Sexual Activity  Alcohol Use Not Currently     Social History   Substance and Sexual Activity  Drug Use Not Currently    Social History   Socioeconomic History   Marital status: Single    Spouse name: Not on file   Number of children: Not on file   Years of education: Not on file   Highest education level: Not on file  Occupational History   Not on file  Tobacco Use   Smoking status: Never    Passive exposure: Never   Smokeless tobacco: Never  Vaping Use   Vaping Use: Unknown  Substance and Sexual Activity   Alcohol use: Not Currently   Drug use: Not Currently   Sexual activity: Not Currently  Other Topics Concern   Not on file   Social History Narrative   Not on file   Social Determinants of Health   Financial Resource Strain: Not on file  Food Insecurity: Not on file  Transportation Needs: Not on file  Physical Activity: Not on file  Stress: Not on file  Social Connections: Not on file   Additional Social History:  Specify valuables returned:  (none)                      Sleep: Good  Appetite:  Good  Current Medications: Current Facility-Administered Medications  Medication Dose Route Frequency Provider Last Rate Last Admin   acetaminophen (TYLENOL) tablet 650 mg  650 mg Oral Q6H PRN [name], [name], MD   650 mg at 06/20/22 1351   alum & mag hydroxide-simeth (MAALOX/MYLANTA) 200-200-20 MG/5ML suspension 30 mL  30 mL Oral Q4H PRN [name], [name], MD   30 mL at 04/26/22 2703   aspirin EC tablet 81 mg  81 mg Oral Daily [name], [name], MD   81 mg at 07/14/22 0818   atorvastatin (LIPITOR) tablet 20 mg  20 mg Oral Daily [name], [name], MD   20 mg at 07/14/22 0818   atropine 1 % ophthalmic solution 1 drop  1 drop Sublingual QID [name], MD   1 drop at 07/14/22 0818   benztropine (COGENTIN) tablet 0.5 mg  0.5 mg Oral BID [name], [name], MD   0.5 mg at 07/14/22 0818   clonazePAM (KLONOPIN)  tablet 1 mg  1 mg Oral TID PRN He, [name], MD   1 mg at 07/12/22 0831   cloZAPine (CLOZARIL) tablet 300 mg  300 mg Oral QHS [name], [name], MD   300 mg at 07/13/22 2124   diphenhydrAMINE (BENADRYL) capsule 50 mg  50 mg Oral Q6H PRN [name], [name], MD   50 mg at 07/06/22 0803   Or   diphenhydrAMINE (BENADRYL) injection 50 mg  50 mg Intramuscular Q6H PRN [name], [name], MD       gabapentin (NEURONTIN) capsule 300 mg  300 mg Oral TID [name], [name], MD   300 mg at 07/14/22 0818   haloperidol (HALDOL) tablet 5 mg  5 mg Oral TID [name], [name], MD   5 mg at 07/14/22 0818   haloperidol (HALDOL) tablet 5 mg  5 mg Oral Q6H PRN [name], [name], MD   5 mg at 07/06/22 2117   Or   haloperidol lactate (HALDOL)  injection 5 mg  5 mg Intramuscular Q6H PRN [name], [name], MD       lisinopril (ZESTRIL) tablet 5 mg  5 mg Oral Daily [name], [name], MD   5 mg at 07/14/22 0818   magnesium hydroxide (MILK OF MAGNESIA) suspension 30 mL  30 mL Oral Daily PRN [name], [name], MD   30 mL at 05/02/22 0092   metoprolol succinate (TOPROL-XL) 24 hr tablet 25 mg  25 mg Oral Daily [name], [name], MD   25 mg at 07/14/22 0818   paliperidone (INVEGA SUSTENNA) injection 156 mg  156 mg Intramuscular Q28 days [name], [name], MD   156 mg at 06/21/22 0917   temazepam (RESTORIL) capsule 15 mg  15 mg Oral QHS [name], [name], MD   15 mg at 07/13/22 2124   ziprasidone (GEODON) injection 20 mg  20 mg Intramuscular Q12H PRN [name], [name], MD   20 mg at 07/08/22 1453    Lab Results: No results found for this or any previous visit (from the past 48 hour(s)).  Blood Alcohol level:  Lab Results  Component Value Date   ETH <10 07/20/2021    Metabolic Disorder Labs: Lab Results  Component Value Date   HGBA1C 5.5 04/10/2022   MPG 111.15 04/10/2022   No results found for: "PROLACTIN" Lab Results  Component Value Date   CHOL 167 11/20/2021   TRIG 107 11/20/2021   HDL 26 (L) 11/20/2021   CHOLHDL 6.4 11/20/2021   VLDL 21 11/20/2021   LDLCALC 120 (H) 11/20/2021    Physical Findings: AIMS: Facial and Oral Movements Muscles of Facial Expression: None, normal Lips and Perioral Area: None, normal Jaw: None, normal Tongue: None, normal,Extremity Movements Upper (arms, wrists, hands, fingers): None, normal Lower (legs, knees, ankles, toes): None, normal, Trunk Movements Neck, shoulders, hips: None, normal, Overall Severity Severity of abnormal movements (highest score from questions above): None, normal Incapacitation due to abnormal movements: None, normal Patient's awareness of abnormal movements (rate only patient's report): No Awareness, Dental Status Current problems with teeth and/or dentures?: No Does patient  usually wear dentures?: No  CIWA:    COWS:     Musculoskeletal: Strength & Muscle Tone: within normal limits Gait & Station: normal Patient leans: N/A  Psychiatric Specialty Exam:  Presentation  General Appearance: Appropriate for Environment; Casual; Neat; Well Groomed  Eye Contact:Fair  Speech:Slow  Speech Volume:Decreased  Handedness:Left   Mood and Affect  Mood:Anxious  Affect:Congruent   Thought Process  Thought Processes:Goal  Directed  Descriptions of Associations:Circumstantial  Orientation:Partial  Thought Content:Scattered  History of Schizophrenia/Schizoaffective disorder:Yes  Duration of Psychotic Symptoms:Greater than six months  Hallucinations:No data recorded Ideas of Reference:None  Suicidal Thoughts:No data recorded Homicidal Thoughts:No data recorded  Sensorium  Memory:Remote Good  Judgment:Fair  Insight:Fair   Executive Functions  Concentration:Fair  Attention Span:Fair  Recall:Fair  Fund of Knowledge:Fair  Language:Fair   Psychomotor Activity  Psychomotor Activity:No data recorded  Assets  Assets:Housing; Resilience; Social Support; Talents/Skills   Sleep  Sleep:No data recorded   Physical Exam: Physical Exam ROS Blood pressure (!) 119/92, pulse (!) 105, temperature 99.2 F (37.3 C), temperature source Oral, resp. rate 18, height 5\' 8"  (1.727 m), weight 85.8 kg, SpO2 99 %. Body mass index is 28.76 kg/m.   Treatment Plan Summary: Daily contact with patient to assess and evaluate symptoms and progress in treatment and Medication management  Plan no change to current medication.  Supportive counseling and encouragement.  -continue scheduled medications:  aspirin EC  81 mg Oral Daily   atorvastatin  20 mg Oral Daily   atropine  1 drop Sublingual QID   benztropine  0.5 mg Oral BID   cloZAPine  300 mg Oral QHS   gabapentin  300 mg Oral TID   haloperidol  5 mg Oral TID   lisinopril  5 mg Oral Daily    metoprolol succinate  25 mg Oral Daily   paliperidone  156 mg Intramuscular Q28 days   temazepam  15 mg Oral QHS    -continue PRN medications. acetaminophen, alum & mag hydroxide-simeth, clonazePAM, diphenhydrAMINE **OR** diphenhydrAMINE, haloperidol **OR** haloperidol lactate, magnesium hydroxide, ziprasidone   , MD 07/14/2022, 12:27 PM

## 2022-07-14 NOTE — Progress Notes (Signed)
Patient compliant with medications. Interacting well with Games developer. Support and encouragement provided.

## 2022-07-14 NOTE — Plan of Care (Signed)
D- Patient alert and oriented to person and situation. Patient presented in a pleasant mood on assessment, and when this writer asked patient if he slept well last night, he stated "yeah". Patient was in a good mood, with a big smile on his face when he saw the staff that were on shift today. 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 Patient does say a few words, as well as repeats words that he hears others say. When this writer asked patient about any signs/symptoms of depression an/or anxiety, he stated "no". Patient also denied SI, HI, AVH, and pain at this time. Patient has come up to the nurses station a few times this morning, showing this Clinical research associate a picture of Candice Night, and when this writer would say her name, patient would repeat it, smile and walk off. Patient had no stated goals for today.  A- Scheduled medications administered to patient, per MD orders. Support and encouragement provided.  Routine safety checks conducted every 15 minutes.  Patient informed to notify staff with problems or concerns.  R- No adverse drug reactions noted. Patient contracts for safety at this time. Patient compliant with medications. Patient receptive, calm, and cooperative. Patient remains safe at this time.  Problem: Education: Goal: Ability to state activities that reduce stress will improve Outcome: Progressing   Problem: Coping: Goal: Ability to identify and develop effective coping behavior will improve Outcome: Progressing   Problem: Self-Concept: Goal: Ability to identify factors that promote anxiety will improve Outcome: Progressing Goal: Level of anxiety will decrease Outcome: Progressing Goal: Ability to modify response to factors that promote anxiety will improve Outcome: Progressing   Problem: Education: Goal: Knowledge of General Education information will improve Description: Including pain rating scale, medication(s)/side effects and  non-pharmacologic comfort measures Outcome: Progressing   Problem: Health Behavior/Discharge Planning: Goal: Ability to manage health-related needs will improve Outcome: Progressing   Problem: Clinical Measurements: Goal: Ability to maintain clinical measurements within normal limits will improve Outcome: Progressing Goal: Will remain free from infection Outcome: Progressing Goal: Diagnostic test results will improve Outcome: Progressing Goal: Respiratory complications will improve Outcome: Progressing Goal: Cardiovascular complication will be avoided Outcome: Progressing   Problem: Activity: Goal: Risk for activity intolerance will decrease Outcome: Progressing   Problem: Nutrition: Goal: Adequate nutrition will be maintained Outcome: Progressing   Problem: Coping: Goal: Level of anxiety will decrease Outcome: Progressing   Problem: Elimination: Goal: Will not experience complications related to bowel motility Outcome: Progressing Goal: Will not experience complications related to urinary retention Outcome: Progressing   Problem: Pain Managment: Goal: General experience of comfort will improve Outcome: Progressing   Problem: Safety: Goal: Ability to remain free from injury will improve Outcome: Progressing   Problem: Skin Integrity: Goal: Risk for impaired skin integrity will decrease Outcome: Progressing   Problem: Activity: Goal: Interest or engagement in leisure activities will improve Outcome: Progressing   Problem: Coping: Goal: Coping ability will improve Outcome: Progressing

## 2022-07-15 NOTE — Plan of Care (Signed)
D: Pt alert and oriented. Pt denies experiencing any anxiety/depression at this time. Pt denies experiencing any pain at this time. Pt denies experiencing any SI/HI, or AVH at this time.   A: Scheduled medications administered to pt, per MD orders. Support and encouragement provided. Frequent verbal contact made. Routine safety checks conducted q15 minutes.   R: No adverse drug reactions noted. Pt verbally contracts for safety at this time. Pt compliant with medications and treatment plan. Pt interacts well with others on the unit. Pt remains safe at this time. Plan of care on going.   Problem: Self-Concept: Goal: Level of anxiety will decrease Outcome: Progressing   Problem: Activity: Goal: Risk for activity intolerance will decrease Outcome: Progressing   Problem: Activity: Goal: Interest or engagement in leisure activities will improve Outcome: Progressing

## 2022-07-15 NOTE — Progress Notes (Signed)
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  Interval History Patient was seen today for re-evaluation.  Nursing reports patient is stable and compliant with medications.   Subjective:   Patient says "Hi" and points on graffiti on his room walls. Then he says "Good!" and shows his stuffer teddy bear. Minimally-verbal. Calm. Denies any complaints. Reports doing "good". He is not appear to be in any physical discomfort. Smiles.  Total Time spent with patient: 15 minutes  Past Psychiatric History: see H&P  Past Medical History:  Past Medical History:  Diagnosis Date   Myocardial infarction (HCC)    Schizophrenia (HCC)    Stroke (HCC)    TBI (traumatic brain injury) (HCC)    History reviewed. No pertinent surgical history. Family History: History reviewed. No pertinent family history. Family Psychiatric  History:  Social History:  Social History   Substance and Sexual Activity  Alcohol Use Not Currently     Social History   Substance and Sexual Activity  Drug Use Not Currently    Social History   Socioeconomic History   Marital status: Single    Spouse name: Not on file   Number of children: Not on file   Years of education: Not on file   Highest education level: Not on file  Occupational History   Not on file  Tobacco Use   Smoking status: Never    Passive exposure: Never   Smokeless tobacco: Never  Vaping Use   Vaping Use: Unknown  Substance and Sexual Activity   Alcohol use: Not Currently   Drug use: Not  Currently   Sexual activity: Not Currently  Other Topics Concern   Not on file  Social History Narrative   Not on file   Social Determinants of Health   Financial Resource Strain: Not on file  Food Insecurity: Not on file  Transportation Needs: Not on file  Physical Activity: Not on file  Stress: Not on file  Social Connections: Not on file   Additional Social History:  Specify valuables returned:  (none)                      Sleep: Good  Appetite:  Good  Current Medications: Current Facility-Administered Medications  Medication Dose Route Frequency Provider Last Rate Last Admin   acetaminophen (TYLENOL) tablet 650 mg  650 mg Oral Q6H PRN [name], [name], MD   650 mg at 06/20/22 1351   alum & mag hydroxide-simeth (MAALOX/MYLANTA) 200-200-20 MG/5ML suspension 30 mL  30 mL Oral Q4H PRN [name], [name], MD   30 mL at 04/26/22 1093   aspirin EC tablet 81 mg  81 mg Oral Daily [name], [name], MD   81 mg at 07/15/22 0838   atorvastatin (LIPITOR) tablet 20 mg  20 mg Oral Daily [name], [name], MD   20 mg at 07/15/22 0838   atropine 1 % ophthalmic solution 1 drop  1 drop Sublingual QID [name], MD   1 drop at 07/15/22 0837   benztropine (COGENTIN) tablet 0.5 mg  0.5 mg Oral BID [name],  [name], MD   0.5 mg at 07/15/22 3570   clonazePAM (KLONOPIN) tablet 1 mg  1 mg Oral TID PRN He, [name], MD   1 mg at 07/12/22 0831   cloZAPine (CLOZARIL) tablet 300 mg  300 mg Oral QHS [name], [name], MD   300 mg at 07/14/22 2100   diphenhydrAMINE (BENADRYL) capsule 50 mg  50 mg Oral Q6H PRN [name], [name], MD   50 mg at 07/06/22 0803   Or   diphenhydrAMINE (BENADRYL) injection 50 mg  50 mg Intramuscular Q6H PRN [name], [name], MD       gabapentin (NEURONTIN) capsule 300 mg  300 mg Oral TID [name], [name], MD   300 mg at 07/15/22 1779   haloperidol (HALDOL) tablet 5 mg  5 mg Oral TID [name], [name], MD   5 mg at 07/15/22 3903   haloperidol (HALDOL) tablet 5 mg  5 mg Oral Q6H PRN  [name], [name], MD   5 mg at 07/14/22 2100   Or   haloperidol lactate (HALDOL) injection 5 mg  5 mg Intramuscular Q6H PRN [name], [name], MD       lisinopril (ZESTRIL) tablet 5 mg  5 mg Oral Daily [name], [name], MD   5 mg at 07/15/22 0092   magnesium hydroxide (MILK OF MAGNESIA) suspension 30 mL  30 mL Oral Daily PRN [name], [name], MD   30 mL at 05/02/22 3300   metoprolol succinate (TOPROL-XL) 24 hr tablet 25 mg  25 mg Oral Daily [name], [name], MD   25 mg at 07/15/22 0838   paliperidone (INVEGA SUSTENNA) injection 156 mg  156 mg Intramuscular Q28 days [name], [name], MD   156 mg at 06/21/22 0917   temazepam (RESTORIL) capsule 15 mg  15 mg Oral QHS [name], [name], MD   15 mg at 07/14/22 2100   ziprasidone (GEODON) injection 20 mg  20 mg Intramuscular Q12H PRN [name], [name], MD   20 mg at 07/08/22 1453    Lab Results: No results found for this or any previous visit (from the past 48 hour(s)).  Blood Alcohol level:  Lab Results  Component Value Date   ETH <10 07/20/2021    Metabolic Disorder Labs: Lab Results  Component Value Date   HGBA1C 5.5 04/10/2022   MPG 111.15 04/10/2022   No results found for: "PROLACTIN" Lab Results  Component Value Date   CHOL 167 11/20/2021   TRIG 107 11/20/2021   HDL 26 (L) 11/20/2021   CHOLHDL 6.4 11/20/2021   VLDL 21 11/20/2021   LDLCALC 120 (H) 11/20/2021    Physical Findings: AIMS: Facial and Oral Movements Muscles of Facial Expression: None, normal Lips and Perioral Area: None, normal Jaw: None, normal Tongue: None, normal,Extremity Movements Upper (arms, wrists, hands, fingers): None, normal Lower (legs, knees, ankles, toes): None, normal, Trunk Movements Neck, shoulders, hips: None, normal, Overall Severity Severity of abnormal movements (highest score from questions above): None, normal Incapacitation due to abnormal movements: None, normal Patient's awareness of abnormal movements (rate only patient's report): No Awareness,  Dental Status Current problems with teeth and/or dentures?: No Does patient usually wear dentures?: No  CIWA:    COWS:     Musculoskeletal: Strength & Muscle Tone: within normal limits Gait & Station: normal Patient leans: N/A  Psychiatric Specialty Exam:  Presentation  General Appearance: Appropriate for Environment; Casual; Neat; Well Groomed  Eye Contact:Fair  Speech:Slow  Speech Volume:Decreased  Handedness:Left  Mood and Affect  Mood:Anxious  Affect:Congruent   Thought Process  Thought Processes:Goal Directed  Descriptions of Associations:Circumstantial  Orientation:Partial  Thought Content:Scattered  History of Schizophrenia/Schizoaffective disorder:Yes  Duration of Psychotic Symptoms:Greater than six months  Hallucinations:No data recorded Ideas of Reference:None  Suicidal Thoughts:No data recorded Homicidal Thoughts:No data recorded  Sensorium  Memory:Remote Good  Judgment:Fair  Insight:Fair   Executive Functions  Concentration:Fair  Attention Span:Fair  Recall:Fair  Fund of Knowledge:Fair  Language:Fair   Psychomotor Activity  Psychomotor Activity:No data recorded  Assets  Assets:Housing; Resilience; Social Support; Talents/Skills   Sleep  Sleep:No data recorded   Physical Exam: Physical Exam ROS Blood pressure (!) 134/100, pulse 85, temperature 98.2 F (36.8 C), temperature source Oral, resp. rate 18, height 5\' 8"  (1.727 m), weight 85.8 kg, SpO2 99 %. Body mass index is 28.76 kg/m.   Treatment Plan Summary: Daily contact with patient to assess and evaluate symptoms and progress in treatment and Medication management  Plan no change to current medication.  Supportive counseling and encouragement.  -continue scheduled medications:  aspirin EC  81 mg Oral Daily   atorvastatin  20 mg Oral Daily   atropine  1 drop Sublingual QID   benztropine  0.5 mg Oral BID   cloZAPine  300 mg Oral QHS   gabapentin  300 mg Oral  TID   haloperidol  5 mg Oral TID   lisinopril  5 mg Oral Daily   metoprolol succinate  25 mg Oral Daily   paliperidone  156 mg Intramuscular Q28 days   temazepam  15 mg Oral QHS    -continue PRN medications. acetaminophen, alum & mag hydroxide-simeth, clonazePAM, diphenhydrAMINE **OR** diphenhydrAMINE, haloperidol **OR** haloperidol lactate, magnesium hydroxide, ziprasidone   , MD 07/15/2022, 9:21 AM

## 2022-07-15 NOTE — Progress Notes (Signed)
Patient appears less anxious no distress noted denies SI/HI/AVH, he was calm this evening no loud outburst or restlessness, and he was complaint with medication regimen. 15 minutes safety checks will continue to monitor.

## 2022-07-16 NOTE — Plan of Care (Signed)
D: Pt alert and oriented. Pt denies experiencing any anxiety/depression at this time. Pt denies experiencing any pain at this time. Pt denies experiencing any SI/HI, or AVH at this time.   A: Scheduled medications administered to pt, per MD orders. Support and encouragement provided. Frequent verbal contact made. Routine safety checks conducted q15 minutes.   R: No adverse drug reactions noted. Pt verbally contracts for safety at this time. Pt compliant with medications and treatment plan. Pt interacts well with others on the unit. Pt remains safe at this time. Plan of care ongoing.   Problem: Education: Goal: Knowledge of General Education information will improve Description: Including pain rating scale, medication(s)/side effects and non-pharmacologic comfort measures Outcome: Progressing   Problem: Activity: Goal: Risk for activity intolerance will decrease Outcome: Progressing

## 2022-07-16 NOTE — Progress Notes (Signed)
Recreation Therapy Notes  Date: 07/16/2022  Time: 10:45 am    Location: Courtyard    Behavioral response: N/A   Intervention Topic: Wellness   Discussion/Intervention: Patient refused to attend group.   Clinical Observations/Feedback:  Patient refused to attend group.    Helyn Schwan LRT/CTRS        Lachelle Rissler 07/16/2022 12:08 PM

## 2022-07-16 NOTE — Progress Notes (Signed)
Patient calm and pleasant during assessment denying SI/HI/AVH. Pt did have a mild outburst towards the patient in room 303 but was redirected. Pt observed interacting appropriately with staff and peers on the unit. Pt compliant with medication administration per MD orders. Pt given education, support, and encouragement to be active in his treatment plan. Pt being monitored Q 15 minutes for safety per unit protocol, remains safe on the unit.

## 2022-07-16 NOTE — Progress Notes (Signed)
[facility] MD Progress Note  07/16/2022 2:56 PM Adam [name]  MRN:  [id number] Subjective: Patient seen for follow-up.  No complaints no apparent change in presentation.  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 No pertinent surgical history. Family History: History reviewed. No pertinent family history. Family Psychiatric  History: See previous Social History:  Social History   Substance and Sexual Activity  Alcohol Use Not Currently     Social History   Substance and Sexual Activity  Drug Use Not Currently    Social History   Socioeconomic History   Marital status: Single    Spouse name: Not on file   Number of children: Not on file   Years of education: Not on file   Highest education level: Not on file  Occupational History   Not on file  Tobacco Use   Smoking status: Never    Passive exposure: Never   Smokeless tobacco: Never  Vaping Use   Vaping Use: Unknown  Substance and Sexual Activity   Alcohol use: Not Currently   Drug use: Not Currently   Sexual activity: Not Currently  Other Topics Concern   Not on file  Social History Narrative   Not on file   Social Determinants of Health   Financial Resource Strain: Not on file  Food Insecurity: Not on file  Transportation Needs: Not on file  Physical Activity: Not on file  Stress: Not  on file  Social Connections: Not on file   Additional Social History:  Specify valuables returned:  (none)                      Sleep: Fair  Appetite:  Fair  Current Medications: Current Facility-Administered Medications  Medication Dose Route Frequency Provider Last Rate Last Admin   acetaminophen (TYLENOL) tablet 650 mg  650 mg Oral Q6H PRN Tonica Brasington T, MD   650 mg at 06/20/22 1351   alum & mag hydroxide-simeth (MAALOX/MYLANTA) 200-200-20 MG/5ML suspension 30 mL  30 mL Oral Q4H PRN Iokepa Geffre T, MD   30 mL at 04/26/22 1751   aspirin EC tablet 81 mg  81 mg Oral Daily Jazziel Fitzsimmons, [name], MD   81 mg at 07/16/22 0738   atorvastatin (LIPITOR) tablet 20 mg  20 mg Oral Daily Runa Whittingham T, MD   20 mg at 07/16/22 0738   atropine 1 % ophthalmic solution 1 drop  1 drop Sublingual QID [name], MD   1 drop at 07/16/22 1141   benztropine (COGENTIN) tablet 0.5 mg  0.5 mg Oral BID Dillyn Menna T, MD   0.5 mg at 07/16/22 0738   clonazePAM (KLONOPIN) tablet 1 mg  1 mg Oral TID PRN He, [name], MD   1 mg at 07/12/22 0831   cloZAPine (CLOZARIL) tablet 300 mg  300 mg Oral QHS Annlee Glandon T, MD   300 mg at 07/15/22 2128  diphenhydrAMINE (BENADRYL) capsule 50 mg  50 mg Oral Q6H PRN Hedwig Mcfall T, MD   50 mg at 07/16/22 1141   Or   diphenhydrAMINE (BENADRYL) injection 50 mg  50 mg Intramuscular Q6H PRN Agustine Rossitto T, MD       gabapentin (NEURONTIN) capsule 300 mg  300 mg Oral TID Deundra Furber T, MD   300 mg at 07/16/22 1141   haloperidol (HALDOL) tablet 5 mg  5 mg Oral TID Sebastin Perlmutter, [name], MD   5 mg at 07/16/22 1141   haloperidol (HALDOL) tablet 5 mg  5 mg Oral Q6H PRN Sullivan Blasing, [name], MD   5 mg at 07/14/22 2100   Or   haloperidol lactate (HALDOL) injection 5 mg  5 mg Intramuscular Q6H PRN Mahitha Hickling T, MD       lisinopril (ZESTRIL) tablet 5 mg  5 mg Oral Daily Demon Volante T, MD   5 mg at 07/16/22 0738   magnesium hydroxide (MILK OF MAGNESIA) suspension 30 mL  30 mL Oral Daily  PRN Zadrian Mccauley T, MD   30 mL at 05/02/22 5456   metoprolol succinate (TOPROL-XL) 24 hr tablet 25 mg  25 mg Oral Daily Ruben Pyka T, MD   25 mg at 07/16/22 0738   paliperidone (INVEGA SUSTENNA) injection 156 mg  156 mg Intramuscular Q28 days Encarnacion Scioneaux T, MD   156 mg at 06/21/22 0917   temazepam (RESTORIL) capsule 15 mg  15 mg Oral QHS Rayford Williamsen T, MD   15 mg at 07/15/22 2128   ziprasidone (GEODON) injection 20 mg  20 mg Intramuscular Q12H PRN Trevelle Mcgurn, [name], MD   20 mg at 07/08/22 1453    Lab Results: No results found for this or any previous visit (from the past 48 hour(s)).  Blood Alcohol level:  Lab Results  Component Value Date   ETH <10 07/20/2021    Metabolic Disorder Labs: Lab Results  Component Value Date   HGBA1C 5.5 04/10/2022   MPG 111.15 04/10/2022   No results found for: "PROLACTIN" Lab Results  Component Value Date   CHOL 167 11/20/2021   TRIG 107 11/20/2021   HDL 26 (L) 11/20/2021   CHOLHDL 6.4 11/20/2021   VLDL 21 11/20/2021   LDLCALC 120 (H) 11/20/2021    Physical Findings: AIMS: Facial and Oral Movements Muscles of Facial Expression: None, normal Lips and Perioral Area: None, normal Jaw: None, normal Tongue: None, normal,Extremity Movements Upper (arms, wrists, hands, fingers): None, normal Lower (legs, knees, ankles, toes): None, normal, Trunk Movements Neck, shoulders, hips: None, normal, Overall Severity Severity of abnormal movements (highest score from questions above): None, normal Incapacitation due to abnormal movements: None, normal Patient's awareness of abnormal movements (rate only patient's report): No Awareness, Dental Status Current problems with teeth and/or dentures?: No Does patient usually wear dentures?: No  CIWA:    COWS:     Musculoskeletal: Strength & Muscle Tone: within normal limits Gait & Station: normal Patient leans: N/A  Psychiatric Specialty Exam:  Presentation  General Appearance: Appropriate for  Environment; Casual; Neat; Well Groomed  Eye Contact:Fair  Speech:Slow  Speech Volume:Decreased  Handedness:Left   Mood and Affect  Mood:Anxious  Affect:Congruent   Thought Process  Thought Processes:Goal Directed  Descriptions of Associations:Circumstantial  Orientation:Partial  Thought Content:Scattered  History of Schizophrenia/Schizoaffective disorder:Yes  Duration of Psychotic Symptoms:Greater than six months  Hallucinations:No data recorded Ideas of Reference:None  Suicidal Thoughts:No data recorded Homicidal Thoughts:No data recorded  Sensorium  Memory:Remote Good  Judgment:Fair  Insight:Fair   Executive Functions  Concentration:Fair  Attention Span:Fair  Recall:Fair  Fund of Knowledge:Fair  Language:Fair   Psychomotor Activity  Psychomotor Activity:No data recorded  Assets  Assets:Housing; Resilience; Social Support; Talents/Skills   Sleep  Sleep:No data recorded   Physical Exam: Physical Exam Vitals and nursing note reviewed.  Constitutional:      Appearance: Normal appearance.  HENT:     Head: Normocephalic and atraumatic.     Mouth/Throat:     Pharynx: Oropharynx is clear.  Eyes:     Pupils: Pupils are equal, round, and reactive to light.  Cardiovascular:     Rate and Rhythm: Normal rate and regular rhythm.  Pulmonary:     Effort: Pulmonary effort is normal.     Breath sounds: Normal breath sounds.  Abdominal:     General: Abdomen is flat.     Palpations: Abdomen is soft.  Musculoskeletal:        General: Normal range of motion.  Skin:    General: Skin is warm and dry.  Neurological:     General: No focal deficit present.     Mental Status: He is alert. Mental status is at baseline.  Psychiatric:        Attention and Perception: He is inattentive.        Mood and Affect: Affect is blunt.        Speech: He is noncommunicative.    Review of Systems  Unable to perform ROS: Language   Blood pressure 122/74,  pulse 88, temperature 97.9 F (36.6 C), temperature source Oral, resp. rate 16, height 5\' 8"  (1.727 m), weight 85.8 kg, SpO2 97 %. Body mass index is 28.76 kg/m.   Treatment Plan Summary: Plan completely stable with no indication of need for changing anything.  Awaiting placement  , MD 07/16/2022, 2:56 PM

## 2022-07-17 NOTE — Progress Notes (Signed)
[name] MD Progress Note  07/17/2022 11:08 AM [name]  MRN:  [id number] Subjective: Follow-up 61 year old man with schizophrenia and aphasia.  No new complaint.  Seen patient today is in his room.  Takes care of his things in his ADLs reasonably neatly.  Listening to music on his phone.  Smiling pleasant.  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 No pertinent surgical history. Family History: History reviewed. No pertinent family history. Family Psychiatric  History: See previous Social History:  Social History   Substance and Sexual Activity  Alcohol Use Not Currently     Social History   Substance and Sexual Activity  Drug Use Not Currently    Social History   Socioeconomic History   Marital status: Single    Spouse name: Not on file   Number of children: Not on file   Years of education: Not on file   Highest education level: Not on file  Occupational History   Not on file  Tobacco Use   Smoking status: Never    Passive exposure: Never   Smokeless tobacco: Never  Vaping Use   Vaping Use: Unknown  Substance and Sexual Activity   Alcohol use: Not Currently   Drug use: Not Currently   Sexual activity: Not Currently  Other Topics Concern   Not on file  Social History Narrative   Not on file   Social Determinants of  Health   Financial Resource Strain: Not on file  Food Insecurity: Not on file  Transportation Needs: Not on file  Physical Activity: Not on file  Stress: Not on file  Social Connections: Not on file   Additional Social History:  Specify valuables returned:  (none)                      Sleep: Fair  Appetite:  Fair  Current Medications: Current Facility-Administered Medications  Medication Dose Route Frequency Provider Last Rate Last Admin   acetaminophen (TYLENOL) tablet 650 mg  650 mg Oral Q6H PRN Betrice Wanat T, MD   650 mg at 06/20/22 1351   alum & mag hydroxide-simeth (MAALOX/MYLANTA) 200-200-20 MG/5ML suspension 30 mL  30 mL Oral Q4H PRN Hadyn Blanck T, MD   30 mL at 04/26/22 2130   aspirin EC tablet 81 mg  81 mg Oral Daily Bertram Haddix, [name], MD   81 mg at 07/17/22 0742   atorvastatin (LIPITOR) tablet 20 mg  20 mg Oral Daily Cliff Damiani T, MD   20 mg at 07/17/22 0742   atropine 1 % ophthalmic solution 1 drop  1 drop Sublingual QID [name], MD   1 drop at 07/17/22 0742   benztropine (COGENTIN) tablet 0.5 mg  0.5 mg Oral BID Elizandro Laura, [name], MD   0.5 mg at 07/17/22 0742   clonazePAM (KLONOPIN) tablet 1 mg  1 mg Oral TID PRN He, [name], MD  1 mg at 07/12/22 0831   cloZAPine (CLOZARIL) tablet 300 mg  300 mg Oral QHS Carrin Vannostrand T, MD   300 mg at 07/16/22 2118   diphenhydrAMINE (BENADRYL) capsule 50 mg  50 mg Oral Q6H PRN Raychelle Hudman T, MD   50 mg at 07/16/22 1141   Or   diphenhydrAMINE (BENADRYL) injection 50 mg  50 mg Intramuscular Q6H PRN Yasmeen Manka T, MD       gabapentin (NEURONTIN) capsule 300 mg  300 mg Oral TID Braidyn Peace, [name], MD   300 mg at 07/17/22 0741   haloperidol (HALDOL) tablet 5 mg  5 mg Oral TID Fumi Guadron, [name], MD   5 mg at 07/17/22 0741   haloperidol (HALDOL) tablet 5 mg  5 mg Oral Q6H PRN Brent Taillon, [name], MD   5 mg at 07/14/22 2100   Or   haloperidol lactate (HALDOL) injection 5 mg  5 mg Intramuscular Q6H PRN Annalyssa Thune T, MD        lisinopril (ZESTRIL) tablet 5 mg  5 mg Oral Daily Danella Philson T, MD   5 mg at 07/17/22 0742   magnesium hydroxide (MILK OF MAGNESIA) suspension 30 mL  30 mL Oral Daily PRN Kaydn Kumpf T, MD   30 mL at 05/02/22 0254   metoprolol succinate (TOPROL-XL) 24 hr tablet 25 mg  25 mg Oral Daily Hitesh Fouche T, MD   25 mg at 07/17/22 0741   paliperidone (INVEGA SUSTENNA) injection 156 mg  156 mg Intramuscular Q28 days Charon Smedberg T, MD   156 mg at 06/21/22 0917   temazepam (RESTORIL) capsule 15 mg  15 mg Oral QHS Devesh Monforte T, MD   15 mg at 07/16/22 2118   ziprasidone (GEODON) injection 20 mg  20 mg Intramuscular Q12H PRN Fredick Schlosser, [name], MD   20 mg at 07/08/22 1453    Lab Results: No results found for this or any previous visit (from the past 48 hour(s)).  Blood Alcohol level:  Lab Results  Component Value Date   ETH <10 07/20/2021    Metabolic Disorder Labs: Lab Results  Component Value Date   HGBA1C 5.5 04/10/2022   MPG 111.15 04/10/2022   No results found for: "PROLACTIN" Lab Results  Component Value Date   CHOL 167 11/20/2021   TRIG 107 11/20/2021   HDL 26 (L) 11/20/2021   CHOLHDL 6.4 11/20/2021   VLDL 21 11/20/2021   LDLCALC 120 (H) 11/20/2021    Physical Findings: AIMS: Facial and Oral Movements Muscles of Facial Expression: None, normal Lips and Perioral Area: None, normal Jaw: None, normal Tongue: None, normal,Extremity Movements Upper (arms, wrists, hands, fingers): None, normal Lower (legs, knees, ankles, toes): None, normal, Trunk Movements Neck, shoulders, hips: None, normal, Overall Severity Severity of abnormal movements (highest score from questions above): None, normal Incapacitation due to abnormal movements: None, normal Patient's awareness of abnormal movements (rate only patient's report): No Awareness, Dental Status Current problems with teeth and/or dentures?: No Does patient usually wear dentures?: No  CIWA:    COWS:      Musculoskeletal: Strength & Muscle Tone: within normal limits Gait & Station: normal Patient leans: N/A  Psychiatric Specialty Exam:  Presentation  General Appearance: Appropriate for Environment; Casual; Neat; Well Groomed  Eye Contact:Fair  Speech:Slow  Speech Volume:Decreased  Handedness:Left   Mood and Affect  Mood:Anxious  Affect:Congruent   Thought Process  Thought Processes:Goal Directed  Descriptions of Associations:Circumstantial  Orientation:Partial  Thought Content:Scattered  History of Schizophrenia/Schizoaffective  disorder:Yes  Duration of Psychotic Symptoms:Greater than six months  Hallucinations:No data recorded Ideas of Reference:None  Suicidal Thoughts:No data recorded Homicidal Thoughts:No data recorded  Sensorium  Memory:Remote Good  Judgment:Fair  Insight:Fair   Executive Functions  Concentration:Fair  Attention Span:Fair  Recall:Fair  Fund of Knowledge:Fair  Language:Fair   Psychomotor Activity  Psychomotor Activity:No data recorded  Assets  Assets:Housing; Resilience; Social Support; Talents/Skills   Sleep  Sleep:No data recorded   Physical Exam: Physical Exam Vitals and nursing note reviewed.  Constitutional:      Appearance: Normal appearance.  HENT:     Head: Normocephalic and atraumatic.     Mouth/Throat:     Pharynx: Oropharynx is clear.  Eyes:     Pupils: Pupils are equal, round, and reactive to light.  Cardiovascular:     Rate and Rhythm: Normal rate and regular rhythm.  Pulmonary:     Effort: Pulmonary effort is normal.     Breath sounds: Normal breath sounds.  Abdominal:     General: Abdomen is flat.     Palpations: Abdomen is soft.  Musculoskeletal:        General: Normal range of motion.  Skin:    General: Skin is warm and dry.  Neurological:     General: No focal deficit present.     Mental Status: He is alert. Mental status is at baseline.  Psychiatric:        Attention and  Perception: He is inattentive.        Mood and Affect: Mood normal. Affect is blunt.        Speech: He is noncommunicative.    Review of Systems  Unable to perform ROS: Language   Blood pressure 119/80, pulse 83, temperature 97.9 F (36.6 C), temperature source Oral, resp. rate 18, height 5\' 8"  (1.727 m), weight 85.8 kg, SpO2 100 %. Body mass index is 28.76 kg/m.   Treatment Plan Summary: Medication management and Plan no change to current medicine.  Encouragement and supportive therapy.  Continue to seek placement  , MD 07/17/2022, 11:08 AM

## 2022-07-17 NOTE — Progress Notes (Signed)
Patient calm and pleasant during assessment denying SI/HI/AVH. Pt observed interacting appropriately with staff and peers on the unit. Pt compliant with medication administration per MD orders. Pt given education, support, and encouragement to be active in his treatment plan. Pt being monitored Q 15 minutes for safety per unit protocol, remains safe on the unit  

## 2022-07-17 NOTE — Progress Notes (Signed)
Recreation Therapy Notes  Date: 07/17/2022   Time: 10:25 am     Location: Craft room    Behavioral response: N/A   Intervention Topic: Time Management   Discussion/Intervention: Patient refused to attend group.    Clinical Observations/Feedback:  Patient refused to attend group.    Ernestene Coover LRT/CTRS        Latesha Chesney 07/17/2022 11:21 AM

## 2022-07-17 NOTE — Plan of Care (Signed)
D: Pt alert and oriented. Pt rates depression 0/10, hopelessness 0/10, and anxiety 0/10. Pt reports energy level as normal and concentration as being good. Pt reports sleep last night as being good. Pt did not receive medications for sleep. Pt denies experiencing any pain at this time. Pt denies experiencing any SI/HI, or AVH at this time.   A: Scheduled medications administered to pt, per MD orders. Support and encouragement provided. Frequent verbal contact made. Routine safety checks conducted q15 minutes.   R: No adverse drug reactions noted. Pt verbally contracts for safety at this time. Pt compliant with medications and treatment plan. Pt interacts well with others on the unit. Pt remains safe at this time. Plan of care ongoing.   Problem: Self-Concept: Goal: Level of anxiety will decrease Outcome: Progressing   Problem: Activity: Goal: Risk for activity intolerance will decrease Outcome: Progressing

## 2022-07-18 NOTE — Progress Notes (Signed)
Recreation Therapy Notes   Date: 07/18/2022  Time: 10:50 am    Location: Courtyard    Behavioral response: N/A   Intervention Topic: Leisure   Discussion/Intervention: Patient refused to attend group.   Clinical Observations/Feedback:  Patient refused to attend group.    Weltha Cathy LRT/CTRS         Dyllan Kats 07/18/2022 1:43 PM

## 2022-07-18 NOTE — Progress Notes (Signed)
Patient calm and pleasant during assessment denying SI/HI/AVH. Pt observed interacting appropriately with staff and peers on the unit. Pt compliant with medication administration per MD orders. Pt given education, support, and encouragement to be active in his treatment plan. Pt being monitored Q 15 minutes for safety per unit protocol, remains safe on the unit  

## 2022-07-18 NOTE — Progress Notes (Signed)
[facility] MD Progress Note  07/18/2022 2:21 PM [name]  MRN:  [id number] Subjective: No new complaint.  No change to clinical presentation. 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 No pertinent surgical history. Family History: History reviewed. No pertinent family history. Family Psychiatric  History: See previous Social History:  Social History   Substance and Sexual Activity  Alcohol Use Not Currently     Social History   Substance and Sexual Activity  Drug Use Not Currently    Social History   Socioeconomic History   Marital status: Single    Spouse name: Not on file   Number of children: Not on file   Years of education: Not on file   Highest education level: Not on file  Occupational History   Not on file  Tobacco Use   Smoking status: Never    Passive exposure: Never   Smokeless tobacco: Never  Vaping Use   Vaping Use: Unknown  Substance and Sexual Activity   Alcohol use: Not Currently   Drug use: Not Currently   Sexual activity: Not Currently  Other Topics Concern   Not on file  Social History Narrative   Not on file   Social Determinants of Health   Financial Resource Strain: Not on file  Food Insecurity: Not on file  Transportation Needs: Not on file  Physical Activity: Not on file  Stress: Not on file  Social Connections: Not  on file   Additional Social History:  Specify valuables returned:  (none)                      Sleep: Fair  Appetite:  Fair  Current Medications: Current Facility-Administered Medications  Medication Dose Route Frequency Provider Last Rate Last Admin   acetaminophen (TYLENOL) tablet 650 mg  650 mg Oral Q6H PRN Ferdinando Lodge T, MD   650 mg at 06/20/22 1351   alum & mag hydroxide-simeth (MAALOX/MYLANTA) 200-200-20 MG/5ML suspension 30 mL  30 mL Oral Q4H PRN Carmello Cabiness T, MD   30 mL at 04/26/22 6387   aspirin EC tablet 81 mg  81 mg Oral Daily Maniah Nading, [name], MD   81 mg at 07/18/22 0844   atorvastatin (LIPITOR) tablet 20 mg  20 mg Oral Daily Darey Hershberger T, MD   20 mg at 07/18/22 0845   atropine 1 % ophthalmic solution 1 drop  1 drop Sublingual QID [name], MD   1 drop at 07/18/22 1236   benztropine (COGENTIN) tablet 0.5 mg  0.5 mg Oral BID Sydell Prowell T, MD   0.5 mg at 07/18/22 0844   clonazePAM (KLONOPIN) tablet 1 mg  1 mg Oral TID PRN He, [name], MD   1 mg at 07/12/22 0831   cloZAPine (CLOZARIL) tablet 300 mg  300 mg Oral QHS Mikaya Bunner T, MD   300 mg at 07/17/22 2118   diphenhydrAMINE (BENADRYL) capsule 50  mg  50 mg Oral Q6H PRN Binh Doten T, MD   50 mg at 07/16/22 1141   Or   diphenhydrAMINE (BENADRYL) injection 50 mg  50 mg Intramuscular Q6H PRN Arcadia Gorgas T, MD       gabapentin (NEURONTIN) capsule 300 mg  300 mg Oral TID Leslee Suire T, MD   300 mg at 07/18/22 1240   haloperidol (HALDOL) tablet 5 mg  5 mg Oral TID Albie Arizpe, [name], MD   5 mg at 07/18/22 1236   haloperidol (HALDOL) tablet 5 mg  5 mg Oral Q6H PRN Soren Pigman, [name], MD   5 mg at 07/14/22 2100   Or   haloperidol lactate (HALDOL) injection 5 mg  5 mg Intramuscular Q6H PRN Alacia Rehmann T, MD       lisinopril (ZESTRIL) tablet 5 mg  5 mg Oral Daily Dehaven Sine T, MD   5 mg at 07/18/22 0844   magnesium hydroxide (MILK OF MAGNESIA) suspension 30 mL  30 mL Oral Daily PRN Stellar Gensel T, MD   30 mL  at 05/02/22 2951   metoprolol succinate (TOPROL-XL) 24 hr tablet 25 mg  25 mg Oral Daily Aidel Davisson T, MD   25 mg at 07/18/22 0844   paliperidone (INVEGA SUSTENNA) injection 156 mg  156 mg Intramuscular Q28 days Falicity Sheets T, MD   156 mg at 06/21/22 0917   temazepam (RESTORIL) capsule 15 mg  15 mg Oral QHS Jyair Kiraly T, MD   15 mg at 07/17/22 2118   ziprasidone (GEODON) injection 20 mg  20 mg Intramuscular Q12H PRN Olufemi Mofield, [name], MD   20 mg at 07/08/22 1453    Lab Results: No results found for this or any previous visit (from the past 48 hour(s)).  Blood Alcohol level:  Lab Results  Component Value Date   ETH <10 07/20/2021    Metabolic Disorder Labs: Lab Results  Component Value Date   HGBA1C 5.5 04/10/2022   MPG 111.15 04/10/2022   No results found for: "PROLACTIN" Lab Results  Component Value Date   CHOL 167 11/20/2021   TRIG 107 11/20/2021   HDL 26 (L) 11/20/2021   CHOLHDL 6.4 11/20/2021   VLDL 21 11/20/2021   LDLCALC 120 (H) 11/20/2021    Physical Findings: AIMS: Facial and Oral Movements Muscles of Facial Expression: None, normal Lips and Perioral Area: None, normal Jaw: None, normal Tongue: None, normal,Extremity Movements Upper (arms, wrists, hands, fingers): None, normal Lower (legs, knees, ankles, toes): None, normal, Trunk Movements Neck, shoulders, hips: None, normal, Overall Severity Severity of abnormal movements (highest score from questions above): None, normal Incapacitation due to abnormal movements: None, normal Patient's awareness of abnormal movements (rate only patient's report): No Awareness, Dental Status Current problems with teeth and/or dentures?: No Does patient usually wear dentures?: No  CIWA:    COWS:     Musculoskeletal: Strength & Muscle Tone: within normal limits Gait & Station: normal Patient leans: N/A  Psychiatric Specialty Exam:  Presentation  General Appearance: Appropriate for Environment; Casual; Neat; Well  Groomed  Eye Contact:Fair  Speech:Slow  Speech Volume:Decreased  Handedness:Left   Mood and Affect  Mood:Anxious  Affect:Congruent   Thought Process  Thought Processes:Goal Directed  Descriptions of Associations:Circumstantial  Orientation:Partial  Thought Content:Scattered  History of Schizophrenia/Schizoaffective disorder:Yes  Duration of Psychotic Symptoms:Greater than six months  Hallucinations:No data recorded Ideas of Reference:None  Suicidal Thoughts:No data recorded Homicidal Thoughts:No data recorded  Sensorium  Memory:Remote Good  Judgment:Fair  Insight:Fair  Executive Functions  Concentration:Fair  Attention Span:Fair  Recall:Fair  [employer] of Knowledge:Fair  Language:Fair   Psychomotor Activity  Psychomotor Activity:No data recorded  Assets  Assets:Housing; Resilience; Social Support; Talents/Skills   Sleep  Sleep:No data recorded   Physical Exam: Physical Exam Vitals and nursing note reviewed.  Constitutional:      Appearance: Normal appearance.  HENT:     Head: Normocephalic and atraumatic.     Mouth/Throat:     Pharynx: Oropharynx is clear.  Eyes:     Pupils: Pupils are equal, round, and reactive to light.  Cardiovascular:     Rate and Rhythm: Normal rate and regular rhythm.  Pulmonary:     Effort: Pulmonary effort is normal.     Breath sounds: Normal breath sounds.  Abdominal:     General: Abdomen is flat.     Palpations: Abdomen is soft.  Musculoskeletal:        General: Normal range of motion.  Skin:    General: Skin is warm and dry.  Neurological:     General: No focal deficit present.     Mental Status: He is alert. Mental status is at baseline.  Psychiatric:        Attention and Perception: He is inattentive.        Mood and Affect: Affect is blunt.        Speech: He is noncommunicative.    Review of Systems  Unable to perform ROS: Language   Blood pressure 108/76, pulse 87, temperature 97.9 F  (36.6 C), temperature source Oral, resp. rate 20, height 5\' 8"  (1.727 m), weight 85.8 kg, SpO2 100 %. Body mass index is 28.76 kg/m.   Treatment Plan Summary: Plan no change to current treatment plan.  We continue to hope for eventual discharge  , MD 07/18/2022, 2:21 PM

## 2022-07-19 MED ORDER — ENSURE ENLIVE PO LIQD
237.0000 mL | Freq: Three times a day (TID) | ORAL | Status: DC
  Administered 2022-07-19 – 2022-10-06 (×208): 237 mL via ORAL

## 2022-07-19 NOTE — Plan of Care (Signed)
D- Patient alert and oriented to person and situation. Patient presented in a pleasant, but in a slightly irritable mood on assessment, not allowing staff to obtain a set of vitals on him. 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 Patient does say a few words, as well as repeats some words that he hears others say. When this writer asked patient about any signs/symptoms of depression an/or anxiety, he stated "no". This writer is unable to determine if patient is experiencing any SI/HI/AVH, but when this Clinical research associate started asking the question, patient just blurted out "no". Patient does not appear to be in any distress nor pain. Patient had no stated goals for today.  A- Scheduled medications administered to patient, per MD orders. Support and encouragement provided.  Routine safety checks conducted every 15 minutes.  Patient informed to notify staff with problems or concerns.  R- No adverse drug reactions noted. Patient contracts for safety at this time. Patient compliant with medications and treatment plan. Patient receptive, calm, and cooperative majority of the shift. Patient remains safe at this time.  Problem: Education: Goal: Ability to state activities that reduce stress will improve Outcome: Progressing   Problem: Coping: Goal: Ability to identify and develop effective coping behavior will improve Outcome: Progressing   Problem: Self-Concept: Goal: Ability to identify factors that promote anxiety will improve Outcome: Progressing Goal: Level of anxiety will decrease Outcome: Progressing Goal: Ability to modify response to factors that promote anxiety will improve Outcome: Progressing   Problem: Education: Goal: Knowledge of General Education information will improve Description: Including pain rating scale, medication(s)/side effects and non-pharmacologic comfort measures Outcome: Progressing   Problem: Health Behavior/Discharge Planning: Goal:  Ability to manage health-related needs will improve Outcome: Progressing   Problem: Clinical Measurements: Goal: Ability to maintain clinical measurements within normal limits will improve Outcome: Progressing Goal: Will remain free from infection Outcome: Progressing Goal: Diagnostic test results will improve Outcome: Progressing Goal: Respiratory complications will improve Outcome: Progressing Goal: Cardiovascular complication will be avoided Outcome: Progressing   Problem: Activity: Goal: Risk for activity intolerance will decrease Outcome: Progressing   Problem: Nutrition: Goal: Adequate nutrition will be maintained Outcome: Progressing   Problem: Coping: Goal: Level of anxiety will decrease Outcome: Progressing   Problem: Elimination: Goal: Will not experience complications related to bowel motility Outcome: Progressing Goal: Will not experience complications related to urinary retention Outcome: Progressing   Problem: Pain Managment: Goal: General experience of comfort will improve Outcome: Progressing   Problem: Safety: Goal: Ability to remain free from injury will improve Outcome: Progressing   Problem: Skin Integrity: Goal: Risk for impaired skin integrity will decrease Outcome: Progressing   Problem: Activity: Goal: Interest or engagement in leisure activities will improve Outcome: Progressing   Problem: Coping: Goal: Coping ability will improve Outcome: Progressing

## 2022-07-19 NOTE — Progress Notes (Signed)
Patient refused evening vitals.

## 2022-07-19 NOTE — Progress Notes (Addendum)
Patient refused for [facility] staff, and this writer, to obtain a set of vitals on him this morning.

## 2022-07-19 NOTE — Progress Notes (Signed)
[facility] MD Progress Note  07/19/2022 12:38 PM Adam [name]  MRN:  [id number] Subjective: Follow-up 61 year old man with schizophrenia.  Behavior has been pleasant recently.  Takes care of his ADLs okay.  Not fighting with anyone and not agitated and not aggressive tolerating medicine well. 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 No pertinent surgical history. Family History: History reviewed. No pertinent family history. Family Psychiatric  History: See previous Social History:  Social History   Substance and Sexual Activity  Alcohol Use Not Currently     Social History   Substance and Sexual Activity  Drug Use Not Currently    Social History   Socioeconomic History   Marital status: Single    Spouse name: Not on file   Number of children: Not on file   Years of education: Not on file   Highest education level: Not on file  Occupational History   Not on file  Tobacco Use   Smoking status: Never    Passive exposure: Never   Smokeless tobacco: Never  Vaping Use   Vaping Use: Unknown  Substance and Sexual Activity   Alcohol use: Not Currently   Drug use: Not Currently   Sexual activity: Not Currently  Other Topics Concern   Not on file  Social History Narrative   Not on file   Social Determinants of Health   Financial Resource Strain: Not on file  Food  Insecurity: Not on file  Transportation Needs: Not on file  Physical Activity: Not on file  Stress: Not on file  Social Connections: Not on file   Additional Social History:  Specify valuables returned:  (none)                      Sleep: Fair  Appetite:  Fair  Current Medications: Current Facility-Administered Medications  Medication Dose Route Frequency Provider Last Rate Last Admin   acetaminophen (TYLENOL) tablet 650 mg  650 mg Oral Q6H PRN Arthor Gorter T, MD   650 mg at 06/20/22 1351   alum & mag hydroxide-simeth (MAALOX/MYLANTA) 200-200-20 MG/5ML suspension 30 mL  30 mL Oral Q4H PRN Leavy Heatherly T, MD   30 mL at 04/26/22 2458   aspirin EC tablet 81 mg  81 mg Oral Daily Tashia Leiterman, [name], MD   81 mg at 07/19/22 0809   atorvastatin (LIPITOR) tablet 20 mg  20 mg Oral Daily Chales Pelissier T, MD   20 mg at 07/19/22 0809   atropine 1 % ophthalmic solution 1 drop  1 drop Sublingual QID [name], MD   1 drop at 07/19/22 1134   benztropine (COGENTIN) tablet 0.5 mg  0.5 mg Oral BID Bera Pinela T, MD   0.5 mg at 07/19/22 0809   clonazePAM (KLONOPIN) tablet 1 mg  1 mg Oral TID PRN He, [name], MD   1 mg at 07/12/22 0831   cloZAPine (CLOZARIL) tablet 300 mg  300 mg Oral QHS Loren Vicens T, MD   300 mg at 07/18/22 2108   diphenhydrAMINE (BENADRYL) capsule 50 mg  50 mg Oral Q6H PRN Axyl Sitzman T, MD   50 mg at 07/16/22 1141   Or   diphenhydrAMINE (BENADRYL) injection 50 mg  50 mg Intramuscular Q6H PRN Irbin Fines T, MD       feeding supplement (ENSURE ENLIVE / ENSURE PLUS) liquid 237 mL  237 mL Oral TID BM Kewanna Kasprzak T, MD   237 mL at 07/19/22 1135   gabapentin (NEURONTIN) capsule 300 mg  300 mg Oral TID Caedence Snowden T, MD   300 mg at 07/19/22 1134   haloperidol (HALDOL) tablet 5 mg  5 mg Oral TID Casten Floren T, MD   5 mg at 07/19/22 1134   haloperidol (HALDOL) tablet 5 mg  5 mg Oral Q6H PRN Dezeray Puccio, [name], MD   5 mg at 07/14/22 2100   Or   haloperidol lactate (HALDOL)  injection 5 mg  5 mg Intramuscular Q6H PRN Arvid Marengo T, MD       lisinopril (ZESTRIL) tablet 5 mg  5 mg Oral Daily Anjelique Makar T, MD   5 mg at 07/19/22 0809   magnesium hydroxide (MILK OF MAGNESIA) suspension 30 mL  30 mL Oral Daily PRN Sulema Braid T, MD   30 mL at 05/02/22 7353   metoprolol succinate (TOPROL-XL) 24 hr tablet 25 mg  25 mg Oral Daily Kamyra Schroeck T, MD   25 mg at 07/19/22 0809   paliperidone (INVEGA SUSTENNA) injection 156 mg  156 mg Intramuscular Q28 days Trayvon Trumbull T, MD   156 mg at 07/19/22 1029   temazepam (RESTORIL) capsule 15 mg  15 mg Oral QHS Serria Sloma T, MD   15 mg at 07/18/22 2108   ziprasidone (GEODON) injection 20 mg  20 mg Intramuscular Q12H PRN Demian Maisel, [name], MD   20 mg at 07/08/22 1453    Lab Results: No results found for this or any previous visit (from the past 48 hour(s)).  Blood Alcohol level:  Lab Results  Component Value Date   ETH <10 07/20/2021    Metabolic Disorder Labs: Lab Results  Component Value Date   HGBA1C 5.5 04/10/2022   MPG 111.15 04/10/2022   No results found for: "PROLACTIN" Lab Results  Component Value Date   CHOL 167 11/20/2021   TRIG 107 11/20/2021   HDL 26 (L) 11/20/2021   CHOLHDL 6.4 11/20/2021   VLDL 21 11/20/2021   LDLCALC 120 (H) 11/20/2021    Physical Findings: AIMS: Facial and Oral Movements Muscles of Facial Expression: None, normal Lips and Perioral Area: None, normal Jaw: None, normal Tongue: None, normal,Extremity Movements Upper (arms, wrists, hands, fingers): None, normal Lower (legs, knees, ankles, toes): None, normal, Trunk Movements Neck, shoulders, hips: None, normal, Overall Severity Severity of abnormal movements (highest score from questions above): None, normal Incapacitation due to abnormal movements: None, normal Patient's awareness of abnormal movements (rate only patient's report): No Awareness, Dental Status Current problems with teeth and/or dentures?: No Does patient  usually wear dentures?: No  CIWA:    COWS:     Musculoskeletal: Strength & Muscle Tone: within normal limits Gait & Station: normal Patient leans: N/A  Psychiatric Specialty Exam:  Presentation  General Appearance: Appropriate for Environment; Casual; Neat; Well Groomed  Eye Contact:Fair  Speech:Slow  Speech Volume:Decreased  Handedness:Left   Mood and Affect  Mood:Anxious  Affect:Congruent   Thought Process  Thought Processes:Goal Directed  Descriptions of Associations:Circumstantial  Orientation:Partial  Thought Content:Scattered  History of Schizophrenia/Schizoaffective disorder:Yes  Duration of Psychotic Symptoms:Greater than six months  Hallucinations:No data recorded Ideas of Reference:None  Suicidal Thoughts:No data recorded Homicidal Thoughts:No data recorded  Sensorium  Memory:Remote Good  Judgment:Fair  Insight:Fair   Executive Functions  Concentration:Fair  Attention Span:Fair  Recall:Fair  Fund of Knowledge:Fair  Language:Fair   Psychomotor Activity  Psychomotor Activity:No data recorded  Assets  Assets:Housing; Resilience; Social Support; Talents/Skills   Sleep  Sleep:No data recorded   Physical Exam: Physical Exam Vitals and nursing note reviewed.  Constitutional:      Appearance: Normal appearance.  HENT:     Head: Normocephalic and atraumatic.     Mouth/Throat:     Pharynx: Oropharynx is clear.  Eyes:     Pupils: Pupils are equal, round, and reactive to light.  Cardiovascular:     Rate and Rhythm: Normal rate and regular rhythm.  Pulmonary:     Effort: Pulmonary effort is normal.     Breath sounds: Normal breath sounds.  Abdominal:     General: Abdomen is flat.     Palpations: Abdomen is soft.  Musculoskeletal:        General: Normal range of motion.  Skin:    General: Skin is warm and dry.  Neurological:     General: No focal deficit present.     Mental Status: He is alert. Mental status is at  baseline.  Psychiatric:        Attention and Perception: He is inattentive.        Mood and Affect: Affect is blunt.        Speech: He is noncommunicative.    Review of Systems  Unable to perform ROS: Language   Blood pressure 99/74, pulse 84, temperature 98.1 F (36.7 C), temperature source Oral, resp. rate 20, height 5\' 8"  (1.727 m), weight 85.8 kg, SpO2 100 %. Body mass index is 28.76 kg/m.   Treatment Plan Summary: Medication management and Plan no change to medication management.  I will continue to follow up with the team here about our ultimate goal of discharge planning.  , MD 07/19/2022, 12:38 PM

## 2022-07-19 NOTE — [facility] Group Notes (Signed)
[facility] Group Notes:  (Nursing/MHT/Case Management/Adjunct)  Date:  07/19/2022  Time:  10:00 AM  Type of Therapy:   community meeting  Participation Level:  Did Not Attend  Modes of Intervention:   Summary of Progress/Problems:  Rodena Goldmann 07/19/2022, 10:00 AM

## 2022-07-19 NOTE — Progress Notes (Signed)
Recreation Therapy Notes  Date: 07/19/2022   Time: 10:45 am     Location: Craft room    Behavioral response: Appropriate  Intervention Topic:  Self-care     Discussion/Intervention:  Group content today was focused on Self-Care. The group defined self-care and some positive ways they care for themselves. Individuals expressed ways and reasons why they neglected any self-care in the past. Patients described ways to improve self-care in the future. The group explained what could happen if they did not do any self-care activities at all. The group participated in the intervention "self-care assessment" where they had a chance to discover some of their weaknesses and strengths in self- care. Patient came up with a self-care plan to improve themselves in the future.  Clinical Observations/Feedback: Patient came to group late and was able to focus on the task at hand. Participant stayed the entire group and was focused on the intervention. Individual was social with peers and staff while participating in the intervention.  Adam [name] LRT/CTRS        Adam [name] 07/19/2022 1:58 PM

## 2022-07-20 NOTE — Progress Notes (Signed)
[facility] MD Progress Note  07/20/2022 3:12 PM Adam [name]  MRN:  [id number] Subjective: Patient seen for follow-up.  Today's word is "my college".  He answered affirmatively that he had attended college but was not able to tell me which one.  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 No pertinent surgical history. Family History: History reviewed. No pertinent family history. Family Psychiatric  History: See previous Social History:  Social History   Substance and Sexual Activity  Alcohol Use Not Currently     Social History   Substance and Sexual Activity  Drug Use Not Currently    Social History   Socioeconomic History   Marital status: Single    Spouse name: Not on file   Number of children: Not on file   Years of education: Not on file   Highest education level: Not on file  Occupational History   Not on file  Tobacco Use   Smoking status: Never    Passive exposure: Never   Smokeless tobacco: Never  Vaping Use   Vaping Use: Unknown  Substance and Sexual Activity   Alcohol use: Not Currently   Drug use: Not Currently   Sexual activity: Not Currently  Other Topics Concern   Not on file  Social History Narrative   Not on file   Social Determinants of Health   Financial Resource  Strain: Not on file  Food Insecurity: Not on file  Transportation Needs: Not on file  Physical Activity: Not on file  Stress: Not on file  Social Connections: Not on file   Additional Social History:  Specify valuables returned:  (none)                      Sleep: Fair  Appetite:  Fair  Current Medications: Current Facility-Administered Medications  Medication Dose Route Frequency Provider Last Rate Last Admin   acetaminophen (TYLENOL) tablet 650 mg  650 mg Oral Q6H PRN Helyne Genther T, MD   650 mg at 06/20/22 1351   alum & mag hydroxide-simeth (MAALOX/MYLANTA) 200-200-20 MG/5ML suspension 30 mL  30 mL Oral Q4H PRN Ceola Para T, MD   30 mL at 04/26/22 0350   aspirin EC tablet 81 mg  81 mg Oral Daily Kahle Mcqueen, [name], MD   81 mg at 07/20/22 0813   atorvastatin (LIPITOR) tablet 20 mg  20 mg Oral Daily Jaxin Fulfer T, MD   20 mg at 07/20/22 0938   atropine 1 % ophthalmic solution 1 drop  1 drop Sublingual QID [name], MD   1 drop at 07/20/22 1136   benztropine (COGENTIN) tablet 0.5 mg  0.5 mg Oral BID Alaine Loughney T, MD   0.5 mg at 07/20/22 1829   clonazePAM (KLONOPIN) tablet 1 mg  1 mg Oral TID PRN He, [name], MD   1 mg at 07/12/22  0831   cloZAPine (CLOZARIL) tablet 300 mg  300 mg Oral QHS Adda Stokes T, MD   300 mg at 07/19/22 2129   diphenhydrAMINE (BENADRYL) capsule 50 mg  50 mg Oral Q6H PRN Kellyn Mccary T, MD   50 mg at 07/16/22 1141   Or   diphenhydrAMINE (BENADRYL) injection 50 mg  50 mg Intramuscular Q6H PRN Alastair Hennes T, MD       feeding supplement (ENSURE ENLIVE / ENSURE PLUS) liquid 237 mL  237 mL Oral TID BM Jene Oravec T, MD   237 mL at 07/20/22 1100   gabapentin (NEURONTIN) capsule 300 mg  300 mg Oral TID Nikkie Liming T, MD   300 mg at 07/20/22 1136   haloperidol (HALDOL) tablet 5 mg  5 mg Oral TID Sherryll Skoczylas, [name], MD   5 mg at 07/20/22 1136   haloperidol (HALDOL) tablet 5 mg  5 mg Oral Q6H PRN Olar Santini, [name], MD   5 mg at 07/14/22 2100   Or    haloperidol lactate (HALDOL) injection 5 mg  5 mg Intramuscular Q6H PRN Taraann Olthoff T, MD       lisinopril (ZESTRIL) tablet 5 mg  5 mg Oral Daily Leiloni Smithers T, MD   5 mg at 07/20/22 0849   magnesium hydroxide (MILK OF MAGNESIA) suspension 30 mL  30 mL Oral Daily PRN Alonzo Loving T, MD   30 mL at 05/02/22 1937   metoprolol succinate (TOPROL-XL) 24 hr tablet 25 mg  25 mg Oral Daily Flynn Gwyn T, MD   25 mg at 07/20/22 0849   paliperidone (INVEGA SUSTENNA) injection 156 mg  156 mg Intramuscular Q28 days Fidelis Loth T, MD   156 mg at 07/19/22 1029   temazepam (RESTORIL) capsule 15 mg  15 mg Oral QHS Narya Beavin T, MD   15 mg at 07/19/22 2130   ziprasidone (GEODON) injection 20 mg  20 mg Intramuscular Q12H PRN Aquilla Voiles, [name], MD   20 mg at 07/08/22 1453    Lab Results: No results found for this or any previous visit (from the past 48 hour(s)).  Blood Alcohol level:  Lab Results  Component Value Date   ETH <10 07/20/2021    Metabolic Disorder Labs: Lab Results  Component Value Date   HGBA1C 5.5 04/10/2022   MPG 111.15 04/10/2022   No results found for: "PROLACTIN" Lab Results  Component Value Date   CHOL 167 11/20/2021   TRIG 107 11/20/2021   HDL 26 (L) 11/20/2021   CHOLHDL 6.4 11/20/2021   VLDL 21 11/20/2021   LDLCALC 120 (H) 11/20/2021    Physical Findings: AIMS: Facial and Oral Movements Muscles of Facial Expression: None, normal Lips and Perioral Area: None, normal Jaw: None, normal Tongue: None, normal,Extremity Movements Upper (arms, wrists, hands, fingers): None, normal Lower (legs, knees, ankles, toes): None, normal, Trunk Movements Neck, shoulders, hips: None, normal, Overall Severity Severity of abnormal movements (highest score from questions above): None, normal Incapacitation due to abnormal movements: None, normal Patient's awareness of abnormal movements (rate only patient's report): No Awareness, Dental Status Current problems with teeth and/or  dentures?: No Does patient usually wear dentures?: No  CIWA:    COWS:     Musculoskeletal: Strength & Muscle Tone: within normal limits Gait & Station: normal Patient leans: N/A  Psychiatric Specialty Exam:  Presentation  General Appearance: Appropriate for Environment; Casual; Neat; Well Groomed  Eye Contact:Fair  Speech:Slow  Speech Volume:Decreased  Handedness:Left   Mood and Affect  Mood:Anxious  Affect:Congruent   Thought Process  Thought Processes:Goal Directed  Descriptions of Associations:Circumstantial  Orientation:Partial  Thought Content:Scattered  History of Schizophrenia/Schizoaffective disorder:Yes  Duration of Psychotic Symptoms:Greater than six months  Hallucinations:No data recorded Ideas of Reference:None  Suicidal Thoughts:No data recorded Homicidal Thoughts:No data recorded  Sensorium  Memory:Remote Good  Judgment:Fair  Insight:Fair   Executive Functions  Concentration:Fair  Attention Span:Fair  [place]   Psychomotor Activity  Psychomotor Activity:No data recorded  Assets  Assets:Housing; Resilience; Social Support; Talents/Skills   Sleep  Sleep:No data recorded   Physical Exam: Physical Exam Vitals and nursing note reviewed.  Constitutional:      Appearance: Normal appearance.  HENT:     Head: Normocephalic and atraumatic.     Mouth/Throat:     Pharynx: Oropharynx is clear.  Eyes:     Pupils: Pupils are equal, round, and reactive to light.  Cardiovascular:     Rate and Rhythm: Normal rate and regular rhythm.  Pulmonary:     Effort: Pulmonary effort is normal.     Breath sounds: Normal breath sounds.  Abdominal:     General: Abdomen is flat.     Palpations: Abdomen is soft.  Musculoskeletal:        General: Normal range of motion.  Skin:    General: Skin is warm and dry.  Neurological:     General: No focal deficit present.     Mental Status: He is  alert. Mental status is at baseline.  Psychiatric:        Attention and Perception: He is inattentive.        Mood and Affect: Mood normal. Affect is blunt.        Speech: He is noncommunicative.    Review of Systems  Unable to perform ROS: Language  Neurological: Negative.    Blood pressure (!) 128/97, pulse 99, temperature 98.1 F (36.7 C), temperature source Oral, resp. rate 20, height 5\' 8"  (1.727 m), weight 85.8 kg, SpO2 98 %. Body mass index is 28.76 kg/m.   Treatment Plan Summary: Plan no change to treatment plan no change to medicine  Alethia Berthold, MD 07/20/2022, 3:12 PM

## 2022-07-20 NOTE — Progress Notes (Signed)
Recreation Therapy Notes    Date: 07/20/2022   Time: 10:50 am     Location: Court yard       Behavioral response: N/A   Intervention Topic: Values    Discussion/Intervention: Patient refused to attend group.    Clinical Observations/Feedback:  Patient refused to attend group.    Marcille Barman LRT/CTRS          Adam [name] 07/20/2022 11:18 AM 

## 2022-07-20 NOTE — Progress Notes (Signed)
   07/20/22 1100  Psych Admission Type (Psych Patients Only)  Admission Status Involuntary  Psychosocial Assessment  Patient Complaints None  Eye Contact Fair  Facial Expression Animated  Affect Appropriate to circumstance  Speech Aphasic  Interaction Childlike  Motor Activity Shuffling  Appearance/Hygiene Disheveled  Behavior Characteristics Appropriate to situation  Mood Pleasant  Thought Process  Coherency Circumstantial  Content WDL  Delusions None reported or observed  Perception WDL  Hallucination None reported or observed  Judgment Limited  Confusion None  Danger to Self  Current suicidal ideation? Denies  Danger to Others  Danger to Others None reported or observed

## 2022-07-21 NOTE — Plan of Care (Signed)
D- Patient alert and oriented person and situation. Patient presented in a pleasant mood on assessment, in which he allowed this writer to obtain a set of vitals on him, without any issues. 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 When this writer asked patient if he was experiencing any signs/symptoms of depression and/or anxiety, he stated "no". This Clinical research associate is unable to determine if patient is experiencing any SI/HI/AVH, but when this Clinical research associate asked the question, patient stated "no". Patient is not in any distress nor pain. Patient had no stated goals for today.  A- Scheduled medications administered to patient, per MD orders. Support and encouragement provided.  Routine safety checks conducted every 15 minutes.  Patient informed to notify staff with problems or concerns.  R- No adverse drug reactions noted. Patient compliant with medications and treatment plan. Patient receptive, calm, and cooperative. Patient remains safe at this time.  Problem: Education: Goal: Ability to state activities that reduce stress will improve Outcome: Progressing   Problem: Coping: Goal: Ability to identify and develop effective coping behavior will improve Outcome: Progressing   Problem: Self-Concept: Goal: Ability to identify factors that promote anxiety will improve Outcome: Progressing Goal: Level of anxiety will decrease Outcome: Progressing Goal: Ability to modify response to factors that promote anxiety will improve Outcome: Progressing   Problem: Education: Goal: Knowledge of General Education information will improve Description: Including pain rating scale, medication(s)/side effects and non-pharmacologic comfort measures Outcome: Progressing   Problem: Health Behavior/Discharge Planning: Goal: Ability to manage health-related needs will improve Outcome: Progressing   Problem: Clinical Measurements: Goal: Ability to maintain clinical measurements within normal  limits will improve Outcome: Progressing Goal: Will remain free from infection Outcome: Progressing Goal: Diagnostic test results will improve Outcome: Progressing Goal: Respiratory complications will improve Outcome: Progressing Goal: Cardiovascular complication will be avoided Outcome: Progressing   Problem: Activity: Goal: Risk for activity intolerance will decrease Outcome: Progressing   Problem: Nutrition: Goal: Adequate nutrition will be maintained Outcome: Progressing   Problem: Coping: Goal: Level of anxiety will decrease Outcome: Progressing   Problem: Elimination: Goal: Will not experience complications related to bowel motility Outcome: Progressing Goal: Will not experience complications related to urinary retention Outcome: Progressing   Problem: Pain Managment: Goal: General experience of comfort will improve Outcome: Progressing   Problem: Safety: Goal: Ability to remain free from injury will improve Outcome: Progressing   Problem: Skin Integrity: Goal: Risk for impaired skin integrity will decrease Outcome: Progressing   Problem: Activity: Goal: Interest or engagement in leisure activities will improve Outcome: Progressing   Problem: Coping: Goal: Coping ability will improve Outcome: Progressing

## 2022-07-21 NOTE — Progress Notes (Signed)
[facility] MD Progress Note  07/21/2022 1:41 PM Adam [name]  MRN:  [id number] Subjective: Adam [name] is seen on rounds.  He is out of his room is a little bit more irritable than usual.  No complaints no issues and no side effects.  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     Past Medical History:  Past Medical History:  Diagnosis Date   Myocardial infarction (HCC)    Schizophrenia (HCC)    Stroke (HCC)    TBI (traumatic brain injury) (HCC)    History reviewed. No pertinent surgical history. Family History: History reviewed. No pertinent family history. Family Psychiatric  History: Unremarkable Social History:  Social History   Substance and Sexual Activity  Alcohol Use Not Currently     Social History   Substance and Sexual Activity  Drug Use Not Currently    Social History   Socioeconomic History   Marital status: Single    Spouse name: Not on file   Number of children: Not on file   Years of education: Not on file   Highest education level: Not on file  Occupational History   Not on file  Tobacco Use   Smoking status: Never    Passive exposure: Never   Smokeless tobacco: Never  Vaping Use   Vaping Use: Unknown  Substance and Sexual Activity   Alcohol use: Not Currently   Drug use: Not Currently   Sexual activity: Not Currently  Other Topics Concern   Not on file  Social History Narrative   Not on file   Social Determinants of Health   Financial Resource Strain: Not on file  Food  Insecurity: Not on file  Transportation Needs: Not on file  Physical Activity: Not on file  Stress: Not on file  Social Connections: Not on file   Additional Social History:  Specify valuables returned:  (none)                      Sleep: Good  Appetite:  Good  Current Medications: Current Facility-Administered Medications  Medication Dose Route Frequency Provider Last Rate Last Admin   acetaminophen (TYLENOL) tablet 650 mg  650 mg Oral Q6H PRN [name], [name], MD   650 mg at 06/20/22 1351   alum & mag hydroxide-simeth (MAALOX/MYLANTA) 200-200-20 MG/5ML suspension 30 mL  30 mL Oral Q4H PRN [name], [name], MD   30 mL at 04/26/22 0623   aspirin EC tablet 81 mg  81 mg Oral Daily [name], [name], MD   81 mg at 07/21/22 7628   atorvastatin (LIPITOR) tablet 20 mg  20 mg Oral Daily [name], [name], MD   20 mg at 07/21/22 0828   atropine 1 % ophthalmic solution 1 drop  1 drop Sublingual QID [name], MD   1 drop at 07/21/22 1234   benztropine (COGENTIN) tablet 0.5 mg  0.5 mg Oral BID [name], [name], MD   0.5 mg at 07/21/22 0828   clonazePAM (KLONOPIN) tablet 1 mg  1 mg Oral TID PRN He, [name], MD   1 mg  at 07/12/22 0831   cloZAPine (CLOZARIL) tablet 300 mg  300 mg Oral QHS [name], [name], MD   300 mg at 07/20/22 2119   diphenhydrAMINE (BENADRYL) capsule 50 mg  50 mg Oral Q6H PRN [name], [name], MD   50 mg at 07/16/22 1141   Or   diphenhydrAMINE (BENADRYL) injection 50 mg  50 mg Intramuscular Q6H PRN [name], [name], MD       feeding supplement (ENSURE ENLIVE / ENSURE PLUS) liquid 237 mL  237 mL Oral TID BM [name], [name], MD   237 mL at 07/21/22 1100   gabapentin (NEURONTIN) capsule 300 mg  300 mg Oral TID [name], [name], MD   300 mg at 07/21/22 1233   haloperidol (HALDOL) tablet 5 mg  5 mg Oral TID [name], [name], MD   5 mg at 07/21/22 1233   haloperidol (HALDOL) tablet 5 mg  5 mg Oral Q6H PRN [name], [name], MD   5 mg at 07/14/22 2100   Or   haloperidol lactate (HALDOL)  injection 5 mg  5 mg Intramuscular Q6H PRN [name], [name], MD       lisinopril (ZESTRIL) tablet 5 mg  5 mg Oral Daily [name], [name], MD   5 mg at 07/21/22 4332   magnesium hydroxide (MILK OF MAGNESIA) suspension 30 mL  30 mL Oral Daily PRN [name], [name], MD   30 mL at 05/02/22 9518   metoprolol succinate (TOPROL-XL) 24 hr tablet 25 mg  25 mg Oral Daily [name], [name], MD   25 mg at 07/20/22 0849   paliperidone (INVEGA SUSTENNA) injection 156 mg  156 mg Intramuscular Q28 days [name], [name], MD   156 mg at 07/19/22 1029   temazepam (RESTORIL) capsule 15 mg  15 mg Oral QHS [name], [name], MD   15 mg at 07/20/22 2119   ziprasidone (GEODON) injection 20 mg  20 mg Intramuscular Q12H PRN [name], [name], MD   20 mg at 07/08/22 1453    Lab Results: No results found for this or any previous visit (from the past 48 hour(s)).  Blood Alcohol level:  Lab Results  Component Value Date   ETH <10 07/20/2021    Metabolic Disorder Labs: Lab Results  Component Value Date   HGBA1C 5.5 04/10/2022   MPG 111.15 04/10/2022   No results found for: "PROLACTIN" Lab Results  Component Value Date   CHOL 167 11/20/2021   TRIG 107 11/20/2021   HDL 26 (L) 11/20/2021   CHOLHDL 6.4 11/20/2021   VLDL 21 11/20/2021   LDLCALC 120 (H) 11/20/2021    Physical Findings: AIMS: Facial and Oral Movements Muscles of Facial Expression: None, normal Lips and Perioral Area: None, normal Jaw: None, normal Tongue: None, normal,Extremity Movements Upper (arms, wrists, hands, fingers): None, normal Lower (legs, knees, ankles, toes): None, normal, Trunk Movements Neck, shoulders, hips: None, normal, Overall Severity Severity of abnormal movements (highest score from questions above): None, normal Incapacitation due to abnormal movements: None, normal Patient's awareness of abnormal movements (rate only patient's report): No Awareness, Dental Status Current problems with teeth and/or dentures?: No Does patient  usually wear dentures?: No  CIWA:    COWS:     Musculoskeletal: Strength & Muscle Tone: within normal limits Gait & Station: normal Patient leans: N/A  Psychiatric Specialty Exam:  Presentation  General Appearance: Appropriate for Environment; Casual; Neat; Well Groomed  Eye Contact:Fair  Speech:Slow  Speech Volume:Decreased  Handedness:Left   Mood  and Affect  Mood:Anxious  Affect:Congruent   Thought Process  Thought Processes:Goal Directed  Descriptions of Associations:Circumstantial  Orientation:Partial  Thought Content:Scattered  History of Schizophrenia/Schizoaffective disorder:Yes  Duration of Psychotic Symptoms:Greater than six months  Hallucinations:No data recorded Ideas of Reference:None  Suicidal Thoughts:No data recorded Homicidal Thoughts:No data recorded  Sensorium  Memory:Remote Good  Judgment:Fair  Insight:Fair   Executive Functions  Concentration:Fair  Attention Span:Fair  Recall:Fair  Fund of Knowledge:Fair  Language:Fair   Psychomotor Activity  Psychomotor Activity:No data recorded  Assets  Assets:No data recorded  Sleep  Sleep:No data recorded   Physical Exam: Physical Exam ROS Blood pressure 99/79, pulse 99, temperature 98.3 F (36.8 C), temperature source Oral, resp. rate 18, height 5\' 8"  (1.727 m), weight 85.8 kg, SpO2 100 %. Body mass index is 28.76 kg/m.   Treatment Plan Summary: Daily contact with patient to assess and evaluate symptoms and progress in treatment, Medication management, and Plan continue current medications.  Lasaundra Riche , DO 07/21/2022, 1:41 PM

## 2022-07-21 NOTE — Plan of Care (Signed)
PT is generally calm and cooperative.  No high risk behaviors noted.  PT irritable at times but does communicate needs.  PT is compliant with medication.  PT allowed this writer to take vitals but was restless when BP cuff applied and would not allow BP to be retaken.  Pt rested through night with no c/o distress.

## 2022-07-21 NOTE — Progress Notes (Signed)
This Clinical research associate held patient's Metoprolol because his BP was 99/79.

## 2022-07-22 NOTE — Progress Notes (Signed)
[facility] MD Progress Note  07/22/2022 1:53 PM Adam [name]  MRN:  [id number] Subjective: Adam [name] has been out of his room a lot more.  He is not as irritable as he was yesterday.  He is compliant with his medications.  No side effects.  No complaints.  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  Past Medical History:  Past Medical History:  Diagnosis Date   Myocardial infarction (HCC)    Schizophrenia (HCC)    Stroke (HCC)    TBI (traumatic brain injury) (HCC)    History reviewed. No pertinent surgical history. Family History: History reviewed. No pertinent family history.  Social History:  Social History   Substance and Sexual Activity  Alcohol Use Not Currently     Social History   Substance and Sexual Activity  Drug Use Not Currently    Social History   Socioeconomic History   Marital status: Single    Spouse name: Not on file   Number of children: Not on file   Years of education: Not on file   Highest education level: Not on file  Occupational History   Not on file  Tobacco Use   Smoking status: Never    Passive exposure: Never   Smokeless tobacco: Never  Vaping Use   Vaping Use: Unknown  Substance and Sexual Activity   Alcohol use: Not Currently   Drug use: Not Currently   Sexual activity: Not Currently  Other Topics Concern   Not on file  Social History Narrative   Not on file   Social Determinants of Health   Financial Resource Strain: Not on file  Food Insecurity: Not on  file  Transportation Needs: Not on file  Physical Activity: Not on file  Stress: Not on file  Social Connections: Not on file   Additional Social History:  Specify valuables returned:  (none)                      Sleep: Good  Appetite:  Good  Current Medications: Current Facility-Administered Medications  Medication Dose Route Frequency Provider Last Rate Last Admin   acetaminophen (TYLENOL) tablet 650 mg  650 mg Oral Q6H PRN [name], [name], MD   650 mg at 06/20/22 1351   alum & mag hydroxide-simeth (MAALOX/MYLANTA) 200-200-20 MG/5ML suspension 30 mL  30 mL Oral Q4H PRN [name], [name], MD   30 mL at 04/26/22 7782   aspirin EC tablet 81 mg  81 mg Oral Daily [name], [name], MD   81 mg at 07/22/22 0933   atorvastatin (LIPITOR) tablet 20 mg  20 mg Oral Daily [name], [name], MD   20 mg at 07/22/22 0932   atropine 1 % ophthalmic solution 1 drop  1 drop Sublingual QID [name], MD   1 drop at 07/22/22 1204   benztropine (COGENTIN) tablet 0.5 mg  0.5 mg Oral BID [name], [name], MD   0.5 mg at 07/22/22 0932   clonazePAM (KLONOPIN) tablet 1 mg  1 mg Oral TID PRN He, [name], MD   1 mg at  07/12/22 0831   cloZAPine (CLOZARIL) tablet 300 mg  300 mg Oral QHS [name], [name], MD   300 mg at 07/21/22 2136   diphenhydrAMINE (BENADRYL) capsule 50 mg  50 mg Oral Q6H PRN [name], [name], MD   50 mg at 07/16/22 1141   Or   diphenhydrAMINE (BENADRYL) injection 50 mg  50 mg Intramuscular Q6H PRN [name], [name], MD       feeding supplement (ENSURE ENLIVE / ENSURE PLUS) liquid 237 mL  237 mL Oral TID BM [name], [name], MD   237 mL at 07/22/22 0934   gabapentin (NEURONTIN) capsule 300 mg  300 mg Oral TID [name], [name], MD   300 mg at 07/22/22 1202   haloperidol (HALDOL) tablet 5 mg  5 mg Oral TID [name], [name], MD   5 mg at 07/22/22 1202   haloperidol (HALDOL) tablet 5 mg  5 mg Oral Q6H PRN [name], [name], MD   5 mg at 07/14/22 2100   Or   haloperidol lactate (HALDOL) injection 5 mg  5 mg  Intramuscular Q6H PRN [name], [name], MD       lisinopril (ZESTRIL) tablet 5 mg  5 mg Oral Daily [name], [name], MD   5 mg at 07/22/22 0940   magnesium hydroxide (MILK OF MAGNESIA) suspension 30 mL  30 mL Oral Daily PRN [name], [name], MD   30 mL at 05/02/22 6283   metoprolol succinate (TOPROL-XL) 24 hr tablet 25 mg  25 mg Oral Daily [name], [name], MD   25 mg at 07/22/22 0940   paliperidone (INVEGA SUSTENNA) injection 156 mg  156 mg Intramuscular Q28 days [name], [name], MD   156 mg at 07/19/22 1029   temazepam (RESTORIL) capsule 15 mg  15 mg Oral QHS [name], [name], MD   15 mg at 07/21/22 2136   ziprasidone (GEODON) injection 20 mg  20 mg Intramuscular Q12H PRN [name], [name], MD   20 mg at 07/08/22 1453    Lab Results: No results found for this or any previous visit (from the past 48 hour(s)).  Blood Alcohol level:  Lab Results  Component Value Date   ETH <10 07/20/2021    Metabolic Disorder Labs: Lab Results  Component Value Date   HGBA1C 5.5 04/10/2022   MPG 111.15 04/10/2022   No results found for: "PROLACTIN" Lab Results  Component Value Date   CHOL 167 11/20/2021   TRIG 107 11/20/2021   HDL 26 (L) 11/20/2021   CHOLHDL 6.4 11/20/2021   VLDL 21 11/20/2021   LDLCALC 120 (H) 11/20/2021    Physical Findings: AIMS: Facial and Oral Movements Muscles of Facial Expression: None, normal Lips and Perioral Area: None, normal Jaw: None, normal Tongue: None, normal,Extremity Movements Upper (arms, wrists, hands, fingers): None, normal Lower (legs, knees, ankles, toes): None, normal, Trunk Movements Neck, shoulders, hips: None, normal, Overall Severity Severity of abnormal movements (highest score from questions above): None, normal Incapacitation due to abnormal movements: None, normal Patient's awareness of abnormal movements (rate only patient's report): No Awareness, Dental Status Current problems with teeth and/or dentures?: No Does patient usually wear dentures?:  No  CIWA:    COWS:     Musculoskeletal: Strength & Muscle Tone: within normal limits Gait & Station: normal Patient leans: N/A  Psychiatric Specialty Exam:  Presentation  General Appearance: Appropriate for Environment; Casual; Neat; Well Groomed  Eye Contact:Fair  Speech:Slow  Speech Volume:Decreased  Handedness:Left   Mood and  Affect  Mood:Anxious  Affect:Congruent   Thought Process  Thought Processes:Goal Directed  Descriptions of Associations:Circumstantial  Orientation:Partial  Thought Content:Scattered  History of Schizophrenia/Schizoaffective disorder:Yes  Duration of Psychotic Symptoms:Greater than six months  Hallucinations:No data recorded Ideas of Reference:None  Suicidal Thoughts:No data recorded Homicidal Thoughts:No data recorded  Sensorium  Memory:Remote Good  Judgment:Fair  Insight:Fair   Executive Functions  Concentration:Fair  Attention Span:Fair  [place]   Psychomotor Activity  Psychomotor Activity:No data recorded  Assets  Assets:No data recorded  Sleep  Sleep:No data recorded    Blood pressure (!) 140/99, pulse 100, temperature 98.6 F (37 C), temperature source Oral, resp. rate 18, height 5\' 8"  (1.727 m), weight 85.8 kg, SpO2 100 %. Body mass index is 28.76 kg/m.   Treatment Plan Summary: Daily contact with patient to assess and evaluate symptoms and progress in treatment, Medication management, and Plan continue current medications.  [name], DO 07/22/2022, 1:53 PM

## 2022-07-22 NOTE — Plan of Care (Signed)
Pt continues to be housed on BMU. Pt has limited insight to current admission. Pt is seen in milieu frequently. Pt is allowed to have multiple contraband items in possession per administration. Pt denies SI / HI / AVH. Patient is minimal in assessment.    Problem: Education: Goal: Ability to state activities that reduce stress will improve Outcome: Not Progressing   Problem: Coping: Goal: Ability to identify and develop effective coping behavior will improve Outcome: Not Progressing   Problem: Self-Concept: Goal: Ability to identify factors that promote anxiety will improve Outcome: Not Progressing Goal: Level of anxiety will decrease Outcome: Not Progressing

## 2022-07-22 NOTE — Group Note (Signed)
[facility] LCSW Group Therapy Note   Group Date: 07/22/2022 Start Time: 1300 End Time: 1400  Type of Therapy and Topic:  Group Therapy:  Feelings around Relapse and Recovery  Participation Level:  Did Not Attend   Mood:  Description of Group:    Patients in this group will discuss emotions they experience before and after a relapse. They will process how experiencing these feelings, or avoidance of experiencing them, relates to having a relapse. Facilitator will guide patients to explore emotions they have related to recovery. Patients will be encouraged to process which emotions are more powerful. They will be guided to discuss the emotional reaction significant others in their lives may have to patients' relapse or recovery. Patients will be assisted in exploring ways to respond to the emotions of others without this contributing to a relapse.  Therapeutic Goals: Patient will identify two or more emotions that lead to relapse for them:  Patient will identify two emotions that result when they relapse:  Patient will identify two emotions related to recovery:  Patient will demonstrate ability to communicate their needs through discussion and/or role plays.   Summary of Patient Progress: Patient did not attend group despite encouraged participation.    Therapeutic Modalities:   Cognitive Behavioral Therapy Solution-Focused Therapy Assertiveness Training Relapse Prevention Therapy   Kaylea Mounsey W Silvino Selman, LCSWA 

## 2022-07-23 LAB — CBC WITH DIFFERENTIAL/PLATELET
Abs Immature Granulocytes: 0.02 10*3/uL (ref 0.00–0.07)
Basophils Absolute: 0.1 10*3/uL (ref 0.0–0.1)
Basophils Relative: 2 %
Eosinophils Absolute: 0 10*3/uL (ref 0.0–0.5)
Eosinophils Relative: 1 %
HCT: 44.1 % (ref 39.0–52.0)
Hemoglobin: 15 g/dL (ref 13.0–17.0)
Immature Granulocytes: 1 %
Lymphocytes Relative: 30 %
Lymphs Abs: 1.2 10*3/uL (ref 0.7–4.0)
MCH: 29.4 pg (ref 26.0–34.0)
MCHC: 34 g/dL (ref 30.0–36.0)
MCV: 86.3 fL (ref 80.0–100.0)
Monocytes Absolute: 0.3 10*3/uL (ref 0.1–1.0)
Monocytes Relative: 8 %
Neutro Abs: 2.5 10*3/uL (ref 1.7–7.7)
Neutrophils Relative %: 58 %
Platelets: 161 10*3/uL (ref 150–400)
RBC: 5.11 MIL/uL (ref 4.22–5.81)
RDW: 13.6 % (ref 11.5–15.5)
WBC: 4.1 10*3/uL (ref 4.0–10.5)
nRBC: 0 % (ref 0.0–0.2)

## 2022-07-23 NOTE — Progress Notes (Signed)
Patient denies SI,HI &AVH. He was compliant with medication and treatment on shift. No new behavioral issues to report on shift at this time.

## 2022-07-23 NOTE — Progress Notes (Signed)
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 No pertinent surgical history. Family History: History reviewed. No pertinent family history. Family Psychiatric  History: None reported Social History:  Social History   Substance and Sexual Activity  Alcohol Use Not Currently     Social History   Substance and Sexual Activity  Drug Use Not Currently    Social History   Socioeconomic History   Marital status: Single    Spouse name: Not on file   Number of children: Not on file   Years of education: Not on file   Highest education level: Not on file  Occupational History   Not on file  Tobacco Use   Smoking status: Never    Passive exposure: Never   Smokeless tobacco: Never  Vaping Use   Vaping Use: Unknown  Substance and Sexual Activity   Alcohol use: Not Currently   Drug use: Not Currently   Sexual activity: Not Currently  Other Topics Concern   Not on file  Social History Narrative   Not on file   Social Determinants of Health   Financial Resource Strain: Not on file  Food Insecurity: Not on file  Transportation Needs: Not on file  Physical Activity: Not on file  Stress: Not on file  Social Connections: Not on file   Additional  Social History:  Specify valuables returned:  (none)                      Sleep: Fair  Appetite:  Fair  Current Medications: Current Facility-Administered Medications  Medication Dose Route Frequency Provider Last Rate Last Admin   acetaminophen (TYLENOL) tablet 650 mg  650 mg Oral Q6H PRN Martita Brumm T, MD   650 mg at 06/20/22 1351   alum & mag hydroxide-simeth (MAALOX/MYLANTA) 200-200-20 MG/5ML suspension 30 mL  30 mL Oral Q4H PRN Isobella Ascher T, MD   30 mL at 04/26/22 9937   aspirin EC tablet 81 mg  81 mg Oral Daily Corbin Falck, [name], MD   81 mg at 07/23/22 1696   atorvastatin (LIPITOR) tablet 20 mg  20 mg Oral Daily Aleah Ahlgrim T, MD   20 mg at 07/23/22 0807   atropine 1 % ophthalmic solution 1 drop  1 drop Sublingual QID [name], MD   1 drop at 07/23/22 1145   benztropine (COGENTIN) tablet 0.5 mg  0.5 mg Oral BID Wadie Liew T, MD   0.5 mg at 07/23/22 7893   clonazePAM (KLONOPIN) tablet 1 mg  1 mg Oral TID PRN He, [name], MD   1 mg at 07/12/22 0831   cloZAPine (CLOZARIL) tablet 300 mg  300 mg Oral QHS Yousuf Ager T, MD   300 mg at 07/22/22 2119   diphenhydrAMINE (BENADRYL) capsule 50 mg  50 mg Oral  Q6H PRN Oberon Hehir, [name], MD   50 mg at 07/16/22 1141   Or   diphenhydrAMINE (BENADRYL) injection 50 mg  50 mg Intramuscular Q6H PRN Yamilett Anastos, [name], MD       feeding supplement (ENSURE ENLIVE / ENSURE PLUS) liquid 237 mL  237 mL Oral TID BM Rosaleen Mazer T, MD   237 mL at 07/23/22 1047   gabapentin (NEURONTIN) capsule 300 mg  300 mg Oral TID Judythe Postema, [name], MD   300 mg at 07/23/22 1146   haloperidol (HALDOL) tablet 5 mg  5 mg Oral TID Skilynn Durney, [name], MD   5 mg at 07/23/22 1145   haloperidol (HALDOL) tablet 5 mg  5 mg Oral Q6H PRN Zenas Santa, [name], MD   5 mg at 07/14/22 2100   Or   haloperidol lactate (HALDOL) injection 5 mg  5 mg Intramuscular Q6H PRN July Nickson, [name], MD       lisinopril (ZESTRIL) tablet 5 mg  5 mg Oral Daily Carrieanne Kleen, [name], MD   5 mg at 07/23/22 5956    magnesium hydroxide (MILK OF MAGNESIA) suspension 30 mL  30 mL Oral Daily PRN Yony Roulston T, MD   30 mL at 05/02/22 3875   metoprolol succinate (TOPROL-XL) 24 hr tablet 25 mg  25 mg Oral Daily Dorsey Charette T, MD   25 mg at 07/23/22 0807   paliperidone (INVEGA SUSTENNA) injection 156 mg  156 mg Intramuscular Q28 days Lorenzo Arscott T, MD   156 mg at 07/19/22 1029   temazepam (RESTORIL) capsule 15 mg  15 mg Oral QHS Antoine Vandermeulen T, MD   15 mg at 07/22/22 2119   ziprasidone (GEODON) injection 20 mg  20 mg Intramuscular Q12H PRN Heran Campau, [name], MD   20 mg at 07/08/22 1453    Lab Results:  Results for orders placed or performed during the hospital encounter of 03/19/22 (from the past 48 hour(s))  CBC with Differential/Platelet     Status: None   Collection Time: 07/23/22  8:30 AM  Result Value Ref Range   WBC 4.1 4.0 - 10.5 K/uL   RBC 5.11 4.22 - 5.81 MIL/uL   Hemoglobin 15.0 13.0 - 17.0 g/dL   HCT 64.3 32.9 - 51.8 %   MCV 86.3 80.0 - 100.0 fL   MCH 29.4 26.0 - 34.0 pg   MCHC 34.0 30.0 - 36.0 g/dL   RDW 84.1 66.0 - 63.0 %   Platelets 161 150 - 400 K/uL   nRBC 0.0 0.0 - 0.2 %   Neutrophils Relative % 58 %   Neutro Abs 2.5 1.7 - 7.7 K/uL   Lymphocytes Relative 30 %   Lymphs Abs 1.2 0.7 - 4.0 K/uL   Monocytes Relative 8 %   Monocytes Absolute 0.3 0.1 - 1.0 K/uL   Eosinophils Relative 1 %   Eosinophils Absolute 0.0 0.0 - 0.5 K/uL   Basophils Relative 2 %   Basophils Absolute 0.1 0.0 - 0.1 K/uL   Immature Granulocytes 1 %   Abs Immature Granulocytes 0.02 0.00 - 0.07 K/uL    Comment: Performed at [facility], [address] [place], [place] [address]    Blood Alcohol level:  Lab Results  Component Value Date   [facility] <10 07/20/2021    Metabolic Disorder Labs: Lab Results  Component Value Date   HGBA1C 5.5 04/10/2022   MPG 111.15 04/10/2022   No results found for: "PROLACTIN" Lab Results  Component Value Date   CHOL  167 11/20/2021   TRIG 107 11/20/2021   HDL  26 (L) 11/20/2021   CHOLHDL 6.4 11/20/2021   VLDL 21 11/20/2021   LDLCALC 120 (H) 11/20/2021    Physical Findings: AIMS: Facial and Oral Movements Muscles of Facial Expression: None, normal Lips and Perioral Area: None, normal Jaw: None, normal Tongue: None, normal,Extremity Movements Upper (arms, wrists, hands, fingers): None, normal Lower (legs, knees, ankles, toes): None, normal, Trunk Movements Neck, shoulders, hips: None, normal, Overall Severity Severity of abnormal movements (highest score from questions above): None, normal Incapacitation due to abnormal movements: None, normal Patient's awareness of abnormal movements (rate only patient's report): No Awareness, Dental Status Current problems with teeth and/or dentures?: No Does patient usually wear dentures?: No  CIWA:    COWS:     Musculoskeletal: Strength & Muscle Tone: within normal limits Gait & Station: normal Patient leans: N/A  Psychiatric Specialty Exam:  Presentation  General Appearance: Appropriate for Environment; Casual; Neat; Well Groomed  Eye Contact:Fair  Speech:Slow  Speech Volume:Decreased  Handedness:Left   Mood and Affect  Mood:Anxious  Affect:Congruent   Thought Process  Thought Processes:Goal Directed  Descriptions of Associations:Circumstantial  Orientation:Partial  Thought Content:Scattered  History of Schizophrenia/Schizoaffective disorder:Yes  Duration of Psychotic Symptoms:Greater than six months  Hallucinations:No data recorded Ideas of Reference:None  Suicidal Thoughts:No data recorded Homicidal Thoughts:No data recorded  Sensorium  Memory:Remote Good  Judgment:Fair  Insight:Fair   Executive Functions  Concentration:Fair  Attention Span:Fair  Recall:Fair  Fund of Knowledge:Fair  Language:Fair   Psychomotor Activity  Psychomotor Activity:No data recorded  Assets  Assets:No data recorded  Sleep  Sleep:No data recorded   Physical  Exam: Physical Exam Constitutional:      Appearance: Normal appearance.  HENT:     Head: Normocephalic and atraumatic.     Mouth/Throat:     Pharynx: Oropharynx is clear.  Eyes:     Pupils: Pupils are equal, round, and reactive to light.  Cardiovascular:     Rate and Rhythm: Normal rate and regular rhythm.  Pulmonary:     Effort: Pulmonary effort is normal.     Breath sounds: Normal breath sounds.  Abdominal:     General: Abdomen is flat.     Palpations: Abdomen is soft.  Musculoskeletal:        General: Normal range of motion.  Skin:    General: Skin is warm and dry.  Neurological:     General: No focal deficit present.     Mental Status: He is alert. Mental status is at baseline.  Psychiatric:        Attention and Perception: Attention normal.        Mood and Affect: Mood normal.        Speech: He is noncommunicative.    Review of Systems  Unable to perform ROS: Language   Blood pressure (!) 128/97, pulse 97, temperature (!) 97.5 F (36.4 C), temperature source Oral, resp. rate 18, height 5\' 8"  (1.727 m), weight 85.8 kg, SpO2 100 %. Body mass index is 28.76 kg/m.   Treatment Plan Summary: Plan very stable with no behavior problems.  Tolerating medicine well.  We continue to focus primarily on discharge  , MD 07/23/2022, 11:49 AM

## 2022-07-23 NOTE — Plan of Care (Signed)
Patient in & out of his room. Visible in the milieu. No aggressive behaviors noted. Compliant with meals and medications. Appetite and energy level good. ADLs maintained. Support and encouragement given.

## 2022-07-23 NOTE — Progress Notes (Signed)
Recreation Therapy Notes  Date: 07/23/2022   Time: 10:05 am     Location: Court yard       Behavioral response: N/A   Intervention Topic: Strengths     Discussion/Intervention: Patient refused to attend group.    Clinical Observations/Feedback:  Patient refused to attend group.    Cairo Lingenfelter LRT/CTRS        Madelena Maturin 07/23/2022 10:21 AM 

## 2022-07-23 NOTE — Progress Notes (Signed)
Patient compliant with medications no adverse drug noted. Denies SI/HI/A/VH interacting well with Games developer. Makes loud noises in his room watching movies on his phone. Q 15 minutes safety checks ongoing. Pt. Remains safe.

## 2022-07-24 NOTE — Progress Notes (Signed)
Patient woke up fixated on Ensure, pacing the halls hollering out "Ensure, Ensure". Patient had to be redirected multiple times, however, it was explained to him the correct times that he would receive it.

## 2022-07-24 NOTE — Progress Notes (Signed)
Patient refused to let this writer obtain a set of vitals on him, but he did take his medicine. MD was notified, via secure chat.

## 2022-07-24 NOTE — Progress Notes (Signed)
Patient is still sleeping, so, this writer will administer scheduled medication once he wakes up. MD has been made aware.

## 2022-07-24 NOTE — Plan of Care (Signed)
D- Patient alert and oriented to person and situation. Patient initially presented in an agitated mood because he woke up fixated on Ensure. After it was explained to him the correct times that he would receive this nutritional supplement, he calmed down and became more pleasant, although he refused to let this writer obtain a set of vitals on him. This Clinical research associate is unable to determine if patient is experiencing any SI/HI/AVH, but when this Clinical research associate asked patient the question, he stated "no". When this writer asked patient if he was experiencing any depression and/or anxiety, once again, he stated "no". Patient is not in any distress nor pain at this time. Patient had no stated goals for today.  A- Scheduled medications administered to patient, per MD orders. Support and encouragement provided.  Routine safety checks conducted every 15 minutes.  Patient informed to notify staff with problems or concerns.  R- No adverse drug reactions noted. Patient contracts for safety at this time. Patient compliant with medications and treatment plan. Patient receptive, calm, and cooperative most of the time. Patient remains safe at this time.  Problem: Education: Goal: Ability to state activities that reduce stress will improve Outcome: Progressing   Problem: Coping: Goal: Ability to identify and develop effective coping behavior will improve Outcome: Progressing   Problem: Self-Concept: Goal: Ability to identify factors that promote anxiety will improve Outcome: Progressing Goal: Level of anxiety will decrease Outcome: Progressing Goal: Ability to modify response to factors that promote anxiety will improve Outcome: Progressing   Problem: Education: Goal: Knowledge of General Education information will improve Description: Including pain rating scale, medication(s)/side effects and non-pharmacologic comfort measures Outcome: Progressing   Problem: Health Behavior/Discharge Planning: Goal: Ability to manage  health-related needs will improve Outcome: Progressing   Problem: Clinical Measurements: Goal: Ability to maintain clinical measurements within normal limits will improve Outcome: Progressing Goal: Will remain free from infection Outcome: Progressing Goal: Diagnostic test results will improve Outcome: Progressing Goal: Respiratory complications will improve Outcome: Progressing Goal: Cardiovascular complication will be avoided Outcome: Progressing   Problem: Activity: Goal: Risk for activity intolerance will decrease Outcome: Progressing   Problem: Nutrition: Goal: Adequate nutrition will be maintained Outcome: Progressing   Problem: Coping: Goal: Level of anxiety will decrease Outcome: Progressing   Problem: Elimination: Goal: Will not experience complications related to bowel motility Outcome: Progressing Goal: Will not experience complications related to urinary retention Outcome: Progressing   Problem: Pain Managment: Goal: General experience of comfort will improve Outcome: Progressing   Problem: Safety: Goal: Ability to remain free from injury will improve Outcome: Progressing   Problem: Skin Integrity: Goal: Risk for impaired skin integrity will decrease Outcome: Progressing   Problem: Activity: Goal: Interest or engagement in leisure activities will improve Outcome: Progressing   Problem: Coping: Goal: Coping ability will improve Outcome: Progressing

## 2022-07-24 NOTE — Progress Notes (Signed)
[facility] MD Progress Note  07/24/2022 12:46 PM Adam [name]  MRN:  [id number] Subjective: No complaints.  No change to condition. 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 No pertinent surgical history. Family History: History reviewed. No pertinent family history. Family Psychiatric  History: See previous Social History:  Social History   Substance and Sexual Activity  Alcohol Use Not Currently     Social History   Substance and Sexual Activity  Drug Use Not Currently    Social History   Socioeconomic History   Marital status: Single    Spouse name: Not on file   Number of children: Not on file   Years of education: Not on file   Highest education level: Not on file  Occupational History   Not on file  Tobacco Use   Smoking status: Never    Passive exposure: Never   Smokeless tobacco: Never  Vaping Use   Vaping Use: Unknown  Substance and Sexual Activity   Alcohol use: Not Currently   Drug use: Not Currently   Sexual activity: Not Currently  Other Topics Concern   Not on file  Social History Narrative   Not on file   Social Determinants of Health   Financial Resource Strain: Not on file  Food Insecurity: Not on file  Transportation Needs: Not on file  Physical Activity: Not on file  Stress: Not on file  Social Connections: Not on file   Additional  Social History:  Specify valuables returned:  (none)                      Sleep: Fair  Appetite:  Fair  Current Medications: Current Facility-Administered Medications  Medication Dose Route Frequency Provider Last Rate Last Admin   acetaminophen (TYLENOL) tablet 650 mg  650 mg Oral Q6H PRN Kishawn Pickar T, MD   650 mg at 06/20/22 1351   alum & mag hydroxide-simeth (MAALOX/MYLANTA) 200-200-20 MG/5ML suspension 30 mL  30 mL Oral Q4H PRN Keshauna Degraffenreid T, MD   30 mL at 04/26/22 8366   aspirin EC tablet 81 mg  81 mg Oral Daily Kinsler Soeder T, MD   81 mg at 07/24/22 1234   atorvastatin (LIPITOR) tablet 20 mg  20 mg Oral Daily Stephen Baruch T, MD   20 mg at 07/24/22 1234   atropine 1 % ophthalmic solution 1 drop  1 drop Sublingual QID [name], MD   1 drop at 07/24/22 1234   benztropine (COGENTIN) tablet 0.5 mg  0.5 mg Oral BID Darrien Laakso T, MD   0.5 mg at 07/24/22 1234   clonazePAM (KLONOPIN) tablet 1 mg  1 mg Oral TID PRN He, [name], MD   1 mg at 07/12/22 0831   cloZAPine (CLOZARIL) tablet 300 mg  300 mg Oral QHS Normal Recinos T, MD   300 mg at 07/23/22 2053   diphenhydrAMINE (BENADRYL) capsule 50 mg  50 mg  Oral Q6H PRN Tenae Graziosi, [name], MD   50 mg at 07/16/22 1141   Or   diphenhydrAMINE (BENADRYL) injection 50 mg  50 mg Intramuscular Q6H PRN Avaleen Brownley, [name], MD       feeding supplement (ENSURE ENLIVE / ENSURE PLUS) liquid 237 mL  237 mL Oral TID BM Rance Smithson T, MD   237 mL at 07/24/22 1237   gabapentin (NEURONTIN) capsule 300 mg  300 mg Oral TID Finnley Lewis, [name], MD   300 mg at 07/24/22 1234   haloperidol (HALDOL) tablet 5 mg  5 mg Oral TID Shakaria Raphael, [name], MD   5 mg at 07/24/22 1234   haloperidol (HALDOL) tablet 5 mg  5 mg Oral Q6H PRN Kelicia Youtz, [name], MD   5 mg at 07/14/22 2100   Or   haloperidol lactate (HALDOL) injection 5 mg  5 mg Intramuscular Q6H PRN Darald Uzzle, [name], MD       lisinopril (ZESTRIL) tablet 5 mg  5 mg Oral Daily Dontay Harm T, MD   5 mg at 07/24/22 1234    magnesium hydroxide (MILK OF MAGNESIA) suspension 30 mL  30 mL Oral Daily PRN Bannon Giammarco T, MD   30 mL at 05/02/22 4742   metoprolol succinate (TOPROL-XL) 24 hr tablet 25 mg  25 mg Oral Daily Jolisa Intriago T, MD   25 mg at 07/24/22 1235   paliperidone (INVEGA SUSTENNA) injection 156 mg  156 mg Intramuscular Q28 days Persia Lintner T, MD   156 mg at 07/19/22 1029   temazepam (RESTORIL) capsule 15 mg  15 mg Oral QHS Lachanda Buczek T, MD   15 mg at 07/23/22 2053   ziprasidone (GEODON) injection 20 mg  20 mg Intramuscular Q12H PRN Justin Buechner, [name], MD   20 mg at 07/08/22 1453    Lab Results:  Results for orders placed or performed during the hospital encounter of 03/19/22 (from the past 48 hour(s))  CBC with Differential/Platelet     Status: None   Collection Time: 07/23/22  8:30 AM  Result Value Ref Range   WBC 4.1 4.0 - 10.5 K/uL   RBC 5.11 4.22 - 5.81 MIL/uL   Hemoglobin 15.0 13.0 - 17.0 g/dL   HCT 59.5 63.8 - 75.6 %   MCV 86.3 80.0 - 100.0 fL   MCH 29.4 26.0 - 34.0 pg   MCHC 34.0 30.0 - 36.0 g/dL   RDW 43.3 29.5 - 18.8 %   Platelets 161 150 - 400 K/uL   nRBC 0.0 0.0 - 0.2 %   Neutrophils Relative % 58 %   Neutro Abs 2.5 1.7 - 7.7 K/uL   Lymphocytes Relative 30 %   Lymphs Abs 1.2 0.7 - 4.0 K/uL   Monocytes Relative 8 %   Monocytes Absolute 0.3 0.1 - 1.0 K/uL   Eosinophils Relative 1 %   Eosinophils Absolute 0.0 0.0 - 0.5 K/uL   Basophils Relative 2 %   Basophils Absolute 0.1 0.0 - 0.1 K/uL   Immature Granulocytes 1 %   Abs Immature Granulocytes 0.02 0.00 - 0.07 K/uL    Comment: Performed at [facility], [address] [place], [place] [address]    Blood Alcohol level:  Lab Results  Component Value Date   [facility] <10 07/20/2021    Metabolic Disorder Labs: Lab Results  Component Value Date   HGBA1C 5.5 04/10/2022   MPG 111.15 04/10/2022   No results found for: "PROLACTIN" Lab Results  Component Value Date  CHOL 167 11/20/2021   TRIG 107 11/20/2021   HDL  26 (L) 11/20/2021   CHOLHDL 6.4 11/20/2021   VLDL 21 11/20/2021   LDLCALC 120 (H) 11/20/2021    Physical Findings: AIMS: Facial and Oral Movements Muscles of Facial Expression: None, normal Lips and Perioral Area: None, normal Jaw: None, normal Tongue: None, normal,Extremity Movements Upper (arms, wrists, hands, fingers): None, normal Lower (legs, knees, ankles, toes): None, normal, Trunk Movements Neck, shoulders, hips: None, normal, Overall Severity Severity of abnormal movements (highest score from questions above): None, normal Incapacitation due to abnormal movements: None, normal Patient's awareness of abnormal movements (rate only patient's report): No Awareness, Dental Status Current problems with teeth and/or dentures?: No Does patient usually wear dentures?: No  CIWA:    COWS:     Musculoskeletal: Strength & Muscle Tone: within normal limits Gait & Station: normal Patient leans: N/A  Psychiatric Specialty Exam:  Presentation  General Appearance: Appropriate for Environment; Casual; Neat; Well Groomed  Eye Contact:Fair  Speech:Slow  Speech Volume:Decreased  Handedness:Left   Mood and Affect  Mood:Anxious  Affect:Congruent   Thought Process  Thought Processes:Goal Directed  Descriptions of Associations:Circumstantial  Orientation:Partial  Thought Content:Scattered  History of Schizophrenia/Schizoaffective disorder:Yes  Duration of Psychotic Symptoms:Greater than six months  Hallucinations:No data recorded Ideas of Reference:None  Suicidal Thoughts:No data recorded Homicidal Thoughts:No data recorded  Sensorium  Memory:Remote Good  Judgment:Fair  Insight:Fair   Executive Functions  Concentration:Fair  Attention Span:Fair  Recall:Fair  Fund of Knowledge:Fair  Language:Fair   Psychomotor Activity  Psychomotor Activity:No data recorded  Assets  Assets:No data recorded  Sleep  Sleep:No data recorded   Physical  Exam: Physical Exam Vitals and nursing note reviewed.  Constitutional:      Appearance: Normal appearance.  HENT:     Head: Normocephalic and atraumatic.     Mouth/Throat:     Pharynx: Oropharynx is clear.  Eyes:     Pupils: Pupils are equal, round, and reactive to light.  Cardiovascular:     Rate and Rhythm: Normal rate and regular rhythm.  Pulmonary:     Effort: Pulmonary effort is normal.     Breath sounds: Normal breath sounds.  Abdominal:     General: Abdomen is flat.     Palpations: Abdomen is soft.  Musculoskeletal:        General: Normal range of motion.  Skin:    General: Skin is warm and dry.  Neurological:     General: No focal deficit present.     Mental Status: He is alert. Mental status is at baseline.  Psychiatric:        Attention and Perception: He is inattentive.        Speech: He is noncommunicative.    Review of Systems  Unable to perform ROS: Language   Blood pressure 128/84, pulse 78, temperature 97.8 F (36.6 C), temperature source Oral, resp. rate 20, height 5\' 8"  (1.727 m), weight 85.8 kg, SpO2 100 %. Body mass index is 28.76 kg/m.   Treatment Plan Summary: Plan no change to condition.  Patient continues to have no reason to be in the hospital.  Purely waiting for placement.  , MD 07/24/2022, 12:46 PM

## 2022-07-24 NOTE — Progress Notes (Signed)
Patient refused scheduled Cogentin, however, he did take all of his other medication. MD was notified via secure chat of this.

## 2022-07-25 NOTE — Progress Notes (Signed)
Pt agitated after taking night medications. Pt redirectable at this time

## 2022-07-25 NOTE — Progress Notes (Signed)
Patient agitated by the presence of new patient on the unit that is being housed on the same hall. Keeps yelling "no way ... no way ... no way" over and over.  Denies the new patient access into the back hall dayroom when patient tried to enter.  Constantly having to redirect him to his room or to stop yelling. Very agitated at this time. Will continue monitor with q 15 minute safety checks.     C Butler-Nicholson, LPN

## 2022-07-25 NOTE — Progress Notes (Signed)
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 No pertinent surgical history. Family History: History reviewed. No pertinent family history. Family Psychiatric  History: None Social History:  Social History   Substance and Sexual Activity  Alcohol Use Not Currently     Social History   Substance and Sexual Activity  Drug Use Not Currently    Social History   Socioeconomic History   Marital status: Single    Spouse name: Not on file   Number of children: Not on file   Years of education: Not on file   Highest education level: Not on file  Occupational History   Not on file  Tobacco Use   Smoking status: Never    Passive exposure: Never   Smokeless tobacco: Never  Vaping Use   Vaping Use: Unknown  Substance and Sexual Activity   Alcohol use: Not Currently   Drug use: Not Currently   Sexual activity: Not Currently  Other Topics Concern   Not on file  Social History Narrative   Not on file   Social Determinants of Health   Financial Resource Strain: Not on file  Food Insecurity: Not on file  Transportation Needs: Not on file  Physical Activity: Not on file  Stress: Not on file  Social Connections: Not on file   Additional  Social History:  Specify valuables returned:  (none)                      Sleep: Fair  Appetite:  Fair  Current Medications: Current Facility-Administered Medications  Medication Dose Route Frequency Provider Last Rate Last Admin   acetaminophen (TYLENOL) tablet 650 mg  650 mg Oral Q6H PRN Auguste Tebbetts T, MD   650 mg at 06/20/22 1351   alum & mag hydroxide-simeth (MAALOX/MYLANTA) 200-200-20 MG/5ML suspension 30 mL  30 mL Oral Q4H PRN Tamas Suen T, MD   30 mL at 04/26/22 9798   aspirin EC tablet 81 mg  81 mg Oral Daily Yamilette Garretson T, MD   81 mg at 07/25/22 0850   atorvastatin (LIPITOR) tablet 20 mg  20 mg Oral Daily Shenika Quint T, MD   20 mg at 07/25/22 0850   atropine 1 % ophthalmic solution 1 drop  1 drop Sublingual QID [name], MD   1 drop at 07/25/22 0849   benztropine (COGENTIN) tablet 0.5 mg  0.5 mg Oral BID Dencil Cayson T, MD   0.5 mg at 07/25/22 0850   clonazePAM (KLONOPIN) tablet 1 mg  1 mg Oral TID PRN He, [name], MD   1 mg at 07/12/22 0831   cloZAPine (CLOZARIL) tablet 300 mg  300 mg Oral QHS Isacc Turney T, MD   300 mg at 07/24/22 2135   diphenhydrAMINE (BENADRYL) capsule 50 mg  50 mg Oral  Q6H PRN Ronon Ferger, [name], MD   50 mg at 07/16/22 1141   Or   diphenhydrAMINE (BENADRYL) injection 50 mg  50 mg Intramuscular Q6H PRN Maydelin Deming T, MD       feeding supplement (ENSURE ENLIVE / ENSURE PLUS) liquid 237 mL  237 mL Oral TID BM Tatym Schermer T, MD   237 mL at 07/24/22 1349   gabapentin (NEURONTIN) capsule 300 mg  300 mg Oral TID Montay Vanvoorhis T, MD   300 mg at 07/25/22 0849   haloperidol (HALDOL) tablet 5 mg  5 mg Oral TID Khilee Hendricksen, [name], MD   5 mg at 07/25/22 0849   haloperidol (HALDOL) tablet 5 mg  5 mg Oral Q6H PRN Shana Zavaleta, [name], MD   5 mg at 07/14/22 2100   Or   haloperidol lactate (HALDOL) injection 5 mg  5 mg Intramuscular Q6H PRN Letishia Elliott T, MD       lisinopril (ZESTRIL) tablet 5 mg  5 mg Oral Daily Danuel Felicetti T, MD   5 mg at 07/25/22 0851    magnesium hydroxide (MILK OF MAGNESIA) suspension 30 mL  30 mL Oral Daily PRN Schneider Warchol T, MD   30 mL at 05/02/22 1884   metoprolol succinate (TOPROL-XL) 24 hr tablet 25 mg  25 mg Oral Daily Jeptha Hinnenkamp T, MD   25 mg at 07/25/22 0850   paliperidone (INVEGA SUSTENNA) injection 156 mg  156 mg Intramuscular Q28 days Davelle Anselmi T, MD   156 mg at 07/19/22 1029   temazepam (RESTORIL) capsule 15 mg  15 mg Oral QHS Shaundrea Carrigg T, MD   15 mg at 07/24/22 2136   ziprasidone (GEODON) injection 20 mg  20 mg Intramuscular Q12H PRN Larsen Dungan, [name], MD   20 mg at 07/08/22 1453    Lab Results: No results found for this or any previous visit (from the past 48 hour(s)).  Blood Alcohol level:  Lab Results  Component Value Date   ETH <10 07/20/2021    Metabolic Disorder Labs: Lab Results  Component Value Date   HGBA1C 5.5 04/10/2022   MPG 111.15 04/10/2022   No results found for: "PROLACTIN" Lab Results  Component Value Date   CHOL 167 11/20/2021   TRIG 107 11/20/2021   HDL 26 (L) 11/20/2021   CHOLHDL 6.4 11/20/2021   VLDL 21 11/20/2021   LDLCALC 120 (H) 11/20/2021    Physical Findings: AIMS: Facial and Oral Movements Muscles of Facial Expression: None, normal Lips and Perioral Area: None, normal Jaw: None, normal Tongue: None, normal,Extremity Movements Upper (arms, wrists, hands, fingers): None, normal Lower (legs, knees, ankles, toes): None, normal, Trunk Movements Neck, shoulders, hips: None, normal, Overall Severity Severity of abnormal movements (highest score from questions above): None, normal Incapacitation due to abnormal movements: None, normal Patient's awareness of abnormal movements (rate only patient's report): No Awareness, Dental Status Current problems with teeth and/or dentures?: No Does patient usually wear dentures?: No  CIWA:    COWS:     Musculoskeletal: Strength & Muscle Tone: within normal limits Gait & Station: normal Patient leans:  N/A  Psychiatric Specialty Exam:  Presentation  General Appearance: Appropriate for Environment; Casual; Neat; Well Groomed  Eye Contact:Fair  Speech:Slow  Speech Volume:Decreased  Handedness:Left   Mood and Affect  Mood:Anxious  Affect:Congruent   Thought Process  Thought Processes:Goal Directed  Descriptions of Associations:Circumstantial  Orientation:Partial  Thought Content:Scattered  History of Schizophrenia/Schizoaffective disorder:Yes  Duration of Psychotic Symptoms:Greater than six months  Hallucinations:No data recorded Ideas of Reference:None  Suicidal Thoughts:No data recorded Homicidal Thoughts:No data recorded  Sensorium  Memory:Remote Good  Judgment:Fair  Insight:Fair   Executive Functions  Concentration:Fair  Attention Span:Fair  [place]   Psychomotor Activity  Psychomotor Activity:No data recorded  Assets  Assets:No data recorded  Sleep  Sleep:No data recorded   Physical Exam: Physical Exam Vitals and nursing note reviewed.  Constitutional:      Appearance: Normal appearance.  HENT:     Head: Normocephalic and atraumatic.     Mouth/Throat:     Pharynx: Oropharynx is clear.  Eyes:     Pupils: Pupils are equal, round, and reactive to light.  Cardiovascular:     Rate and Rhythm: Normal rate and regular rhythm.  Pulmonary:     Effort: Pulmonary effort is normal.     Breath sounds: Normal breath sounds.  Abdominal:     General: Abdomen is flat.     Palpations: Abdomen is soft.  Musculoskeletal:        General: Normal range of motion.  Skin:    General: Skin is warm and dry.  Neurological:     General: No focal deficit present.     Mental Status: He is alert. Mental status is at baseline.  Psychiatric:        Mood and Affect: Mood normal.        Thought Content: Thought content normal.    Review of Systems  Unable to perform ROS: Language   Blood pressure  119/83, pulse 93, temperature 97.9 F (36.6 C), temperature source Oral, resp. rate 18, height 5\' 8"  (1.727 m), weight 85.8 kg, SpO2 100 %. Body mass index is 28.76 kg/m.   Treatment Plan Summary: Medication management and Plan completely stable patient much improved since being on clozapine.  No behavior problems.  No need for further inpatient hospitalization only here because of need for placement  Alethia Berthold, MD 07/25/2022, 11:16 AM

## 2022-07-25 NOTE — [facility] Group Notes (Signed)
Adult Psychoeducational Group Note  Date:  07/25/2022 Time:  12:29 AM  Group Topic/Focus:  Wrap-Up Group:   The focus of this group is to help patients review their daily goal of treatment and discuss progress on daily workbooks.  Participation Level:  Active  Participation Quality:  Appropriate and Intrusive  Affect:  Excited  Cognitive:  Alert and Disorganized  Insight: Improving and Limited  Engagement in Group:  Developing/Improving and Distracting  Modes of Intervention:  Discussion  Additional Comments:    [name],Ramya Vanbergen E 07/25/2022, 12:29 AM

## 2022-07-25 NOTE — Progress Notes (Signed)
Patient calm and pleasant during assessment denying SI/HI/AVH. Pt observed interacting appropriately with staff and peers on the unit. Pt compliant with medication administration per MD orders. Pt given education, support, and encouragement to be active in his treatment plan. Pt being monitored Q 15 minutes for safety per unit protocol, remains safe on the unit.   Pt is agitated with having a patient on his hall for the first time since he has been on the unit. Pt given education, still irritable

## 2022-07-25 NOTE — Plan of Care (Signed)
Patient in & out of his room. Appropriate with staff & peers. Denies SI,HI and AVH. No aggressive behaviors noted. Appetite and energy level good. ADLs maintained. Support and encouragement given.

## 2022-07-25 NOTE — [facility] Group Notes (Signed)
[facility] Group Notes:  (Nursing/MHT/Case Management/Adjunct)  Date:  07/25/2022  Time:  10:20 PM  Type of Therapy:   Wrap up  Participation Level:  Active  Participation Quality:  Appropriate  Affect:  Appropriate  Cognitive:  Alert  Insight:  Limited  Engagement in Group:  Engaged and earlier in group he commune a little but was not understandable  Modes of Intervention:  Limit-setting and Support  Summary of Progress/Problems:  Mayra Neer 07/25/2022, 10:20 PM

## 2022-07-25 NOTE — [facility] Group Notes (Incomplete)
[facility] Group Notes:  (Nursing/MHT/Case Management/Adjunct)  Date:  07/25/2022  Time:  10:08 PM  Type of Therapy:   Wrap up  Participation Level:  Active  Participation Quality:  Appropriate  Affect:  Appropriate  Cognitive:  Alert  Insight:  Limited  Engagement in Group:  Engaged and    Modes of Intervention:  {[facility] MODES OF INTERVENTION:22269}  Summary of Progress/Problems:  Adam [name] 07/25/2022, 10:08 PM

## 2022-07-25 NOTE — Progress Notes (Signed)
Recreation Therapy Notes   Date: 07/25/2022  Time: 10:20 am    Location: Courtyard    Behavioral response: N/A   Intervention Topic: Wellness   Discussion/Intervention: Patient refused to attend group.   Clinical Observations/Feedback:  Patient refused to attend group.    [name] LRT/CTRS        Priyal Musquiz 07/25/2022 12:26 PM

## 2022-07-25 NOTE — Progress Notes (Signed)
Patient is alert and oriented times 3. Mood and affect appropriate. Patient rates pain as 0/10. He denies SI, HI, and AVH. Patient also denies feelings of anxiety and depression at this time. Evening medicines administered whole by mouth without difficulty. Patient ate snack in day room; appetite was fair. Patient remains on unit with Q15 minute checks in place.

## 2022-07-26 NOTE — Progress Notes (Signed)
Patient presents with pleasant mood, affect congruent. Adam [name] continues to have expressive aphasia, however smiles when staff approached after allowing patient to arise on his own later this am. (Pt slept in until 1000). Patient denies any acute concerns and compliant with am medications. Pt is visualized pacing unit. Pt is able to point and express his needs with gestures and pointing. Provided a phone on the unit per management due to patients long term care status and limitations. Pt is safe. Pt did complete his self inventory and denies any depression or anxiety today and no acute concerns. Pt signed his self inventory as THOR. Pt is safe, will con't to monitor.,

## 2022-07-26 NOTE — [facility] Group Notes (Signed)
[facility] Group Notes:  (Nursing/MHT/Case Management/Adjunct)  Date:  07/26/2022  Time:  10:45 AM  Type of Therapy:   community meeting  Participation Level:  Did Not Attend  Participation Quality:    Rodena Goldmann 07/26/2022, 10:45 AM

## 2022-07-26 NOTE — Progress Notes (Signed)
[facility] MD Progress Note  07/26/2022 11:30 AM Adam [name]  MRN:  [id number] Subjective: Patient seen and chart reviewed.  No change to behavior or presentation.  Remains calm and appropriate getting along well on the unit. 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 No pertinent surgical history. Family History: History reviewed. No pertinent family history. Family Psychiatric  History: See previous Social History:  Social History   Substance and Sexual Activity  Alcohol Use Not Currently     Social History   Substance and Sexual Activity  Drug Use Not Currently    Social History   Socioeconomic History   Marital status: Single    Spouse name: Not on file   Number of children: Not on file   Years of education: Not on file   Highest education level: Not on file  Occupational History   Not on file  Tobacco Use   Smoking status: Never    Passive exposure: Never   Smokeless tobacco: Never  Vaping Use   Vaping Use: Unknown  Substance and Sexual Activity   Alcohol use: Not Currently   Drug use: Not Currently   Sexual activity: Not Currently  Other Topics Concern   Not on file  Social History Narrative   Not on file   Social Determinants of Health   Financial Resource Strain: Not on file  Food Insecurity: Not on file  Transportation Needs: Not on file  Physical  Activity: Not on file  Stress: Not on file  Social Connections: Not on file   Additional Social History:  Specify valuables returned:  (none)                      Sleep: Fair  Appetite:  Fair  Current Medications: Current Facility-Administered Medications  Medication Dose Route Frequency Provider Last Rate Last Admin   acetaminophen (TYLENOL) tablet 650 mg  650 mg Oral Q6H PRN Jaquan Sadowsky T, MD   650 mg at 06/20/22 1351   alum & mag hydroxide-simeth (MAALOX/MYLANTA) 200-200-20 MG/5ML suspension 30 mL  30 mL Oral Q4H PRN Tashira Torre T, MD   30 mL at 04/26/22 5621   aspirin EC tablet 81 mg  81 mg Oral Daily Shylee Durrett, [name], MD   81 mg at 07/26/22 0845   atorvastatin (LIPITOR) tablet 20 mg  20 mg Oral Daily Edson Deridder T, MD   20 mg at 07/26/22 0845   atropine 1 % ophthalmic solution 1 drop  1 drop Sublingual QID [name], MD   1 drop at 07/26/22 0846   benztropine (COGENTIN) tablet 0.5 mg  0.5 mg Oral BID Zyan Coby T, MD   0.5 mg at 07/26/22 0845   clonazePAM (KLONOPIN) tablet 1 mg  1 mg Oral TID PRN He, [name], MD   1 mg at 07/12/22 0831   cloZAPine (CLOZARIL) tablet 300 mg  300 mg Oral QHS Milbern Doescher, [name], MD  300 mg at 07/25/22 2135   diphenhydrAMINE (BENADRYL) capsule 50 mg  50 mg Oral Q6H PRN Marsella Suman T, MD   50 mg at 07/16/22 1141   Or   diphenhydrAMINE (BENADRYL) injection 50 mg  50 mg Intramuscular Q6H PRN Ajit Errico T, MD       feeding supplement (ENSURE ENLIVE / ENSURE PLUS) liquid 237 mL  237 mL Oral TID BM Alyiah Ulloa T, MD   237 mL at 07/26/22 1052   gabapentin (NEURONTIN) capsule 300 mg  300 mg Oral TID Abdurrahman Petersheim T, MD   300 mg at 07/26/22 0845   haloperidol (HALDOL) tablet 5 mg  5 mg Oral TID River Ambrosio, [name], MD   5 mg at 07/26/22 0845   haloperidol (HALDOL) tablet 5 mg  5 mg Oral Q6H PRN Lisa Milian, [name], MD   5 mg at 07/14/22 2100   Or   haloperidol lactate (HALDOL) injection 5 mg  5 mg Intramuscular Q6H PRN Shadara Lopez T, MD        lisinopril (ZESTRIL) tablet 5 mg  5 mg Oral Daily Kerrington Sova T, MD   5 mg at 07/26/22 0845   magnesium hydroxide (MILK OF MAGNESIA) suspension 30 mL  30 mL Oral Daily PRN Estalee Mccandlish T, MD   30 mL at 05/02/22 9030   metoprolol succinate (TOPROL-XL) 24 hr tablet 25 mg  25 mg Oral Daily Adama Ivins T, MD   25 mg at 07/26/22 0845   paliperidone (INVEGA SUSTENNA) injection 156 mg  156 mg Intramuscular Q28 days Micah Barnier T, MD   156 mg at 07/19/22 1029   temazepam (RESTORIL) capsule 15 mg  15 mg Oral QHS Aidric Endicott T, MD   15 mg at 07/25/22 2135   ziprasidone (GEODON) injection 20 mg  20 mg Intramuscular Q12H PRN Tranae Laramie, [name], MD   20 mg at 07/08/22 1453    Lab Results: No results found for this or any previous visit (from the past 48 hour(s)).  Blood Alcohol level:  Lab Results  Component Value Date   ETH <10 07/20/2021    Metabolic Disorder Labs: Lab Results  Component Value Date   HGBA1C 5.5 04/10/2022   MPG 111.15 04/10/2022   No results found for: "PROLACTIN" Lab Results  Component Value Date   CHOL 167 11/20/2021   TRIG 107 11/20/2021   HDL 26 (L) 11/20/2021   CHOLHDL 6.4 11/20/2021   VLDL 21 11/20/2021   LDLCALC 120 (H) 11/20/2021    Physical Findings: AIMS: Facial and Oral Movements Muscles of Facial Expression: None, normal Lips and Perioral Area: None, normal Jaw: None, normal Tongue: None, normal,Extremity Movements Upper (arms, wrists, hands, fingers): None, normal Lower (legs, knees, ankles, toes): None, normal, Trunk Movements Neck, shoulders, hips: None, normal, Overall Severity Severity of abnormal movements (highest score from questions above): None, normal Incapacitation due to abnormal movements: None, normal Patient's awareness of abnormal movements (rate only patient's report): No Awareness, Dental Status Current problems with teeth and/or dentures?: No Does patient usually wear dentures?: No  CIWA:    COWS:      Musculoskeletal: Strength & Muscle Tone: within normal limits Gait & Station: normal Patient leans: N/A  Psychiatric Specialty Exam:  Presentation  General Appearance: Appropriate for Environment; Casual; Neat; Well Groomed  Eye Contact:Fair  Speech:Slow  Speech Volume:Decreased  Handedness:Left   Mood and Affect  Mood:Anxious  Affect:Congruent   Thought Process  Thought Processes:Goal Directed  Descriptions of Associations:Circumstantial  Orientation:Partial  Thought Content:Scattered  History of Schizophrenia/Schizoaffective disorder:Yes  Duration of Psychotic Symptoms:Greater than six months  Hallucinations:No data recorded Ideas of Reference:None  Suicidal Thoughts:No data recorded Homicidal Thoughts:No data recorded  Sensorium  Memory:Remote Good  Judgment:Fair  Insight:Fair   Executive Functions  Concentration:Fair  Attention Span:Fair  Recall:Fair  Fund of Knowledge:Fair  Language:Fair   Psychomotor Activity  Psychomotor Activity:No data recorded  Assets  Assets:No data recorded  Sleep  Sleep:No data recorded   Physical Exam: Physical Exam Vitals and nursing note reviewed.  Constitutional:      Appearance: Normal appearance.  HENT:     Head: Normocephalic and atraumatic.     Mouth/Throat:     Pharynx: Oropharynx is clear.  Eyes:     Pupils: Pupils are equal, round, and reactive to light.  Cardiovascular:     Rate and Rhythm: Normal rate and regular rhythm.  Pulmonary:     Effort: Pulmonary effort is normal.     Breath sounds: Normal breath sounds.  Abdominal:     General: Abdomen is flat.     Palpations: Abdomen is soft.  Musculoskeletal:        General: Normal range of motion.  Skin:    General: Skin is warm and dry.  Neurological:     General: No focal deficit present.     Mental Status: He is alert. Mental status is at baseline.  Psychiatric:        Attention and Perception: Attention normal.         Mood and Affect: Mood normal.        Speech: He is noncommunicative.    Review of Systems  Unable to perform ROS: Language   Blood pressure (!) 155/100, pulse 92, temperature 98.1 F (36.7 C), temperature source Oral, resp. rate 18, height 5\' 8"  (1.727 m), weight 85.8 kg, SpO2 100 %. Body mass index is 28.76 kg/m.   Treatment Plan Summary: Plan stable.  Doing well.  No need for inpatient hospital level treatment and only here because we have no disposition.  Continue to work on discharge planning if possible.  , MD 07/26/2022, 11:30 AM

## 2022-07-26 NOTE — Progress Notes (Signed)
Notified by MHT of low BP. Patient assessed and in no acute distress. Pt is not diaphoretic, ate dinner without issue. No reports of pain and does not appear to be in pain. PO fluids encouraged , pt states ''ahh Drink yes!'' When given gatorade and water. Will con't to monitor and recheck vitals.

## 2022-07-26 NOTE — Progress Notes (Signed)
Patient calm and pleasant during assessment denying SI/HI/AVH. Pt observed interacting appropriately with staff and peers on the unit. Pt compliant with medication administration per MD orders. Pt given education, support, and encouragement to be active in his treatment plan. Pt being monitored Q 15 minutes for safety per unit protocol, remains safe on the unit  

## 2022-07-26 NOTE — Progress Notes (Addendum)
Recreation Therapy Notes  Date: 07/26/2022  Time: 10:35 am    Location: Craft room    Behavioral response: N/A   Intervention Topic: Stress Management   Discussion/Intervention: Patient refused to attend group.   Clinical Observations/Feedback:  Patient refused to attend group.    Hutchinson Isenberg LRT/CTRS        Sava Proby 07/26/2022 11:46 AM

## 2022-07-27 NOTE — Progress Notes (Signed)
[facility] MD Progress Note  07/27/2022 3:03 PM Adam [name]  MRN:  [id number] Subjective: No new complaints.  Behavior stable.  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 No pertinent surgical history. Family History: History reviewed. No pertinent family history. Family Psychiatric  History: See previous Social History:  Social History   Substance and Sexual Activity  Alcohol Use Not Currently     Social History   Substance and Sexual Activity  Drug Use Not Currently    Social History   Socioeconomic History   Marital status: Single    Spouse name: Not on file   Number of children: Not on file   Years of education: Not on file   Highest education level: Not on file  Occupational History   Not on file  Tobacco Use   Smoking status: Never    Passive exposure: Never   Smokeless tobacco: Never  Vaping Use   Vaping Use: Unknown  Substance and Sexual Activity   Alcohol use: Not Currently   Drug use: Not Currently   Sexual activity: Not Currently  Other Topics Concern   Not on file  Social History Narrative   Not on file   Social Determinants of Health   Financial Resource Strain: Not on file  Food Insecurity: Not on file  Transportation Needs: Not on file  Physical Activity: Not on file  Stress: Not on file  Social  Connections: Not on file   Additional Social History:  Specify valuables returned:  (none)                      Sleep: Fair  Appetite:  Fair  Current Medications: Current Facility-Administered Medications  Medication Dose Route Frequency Provider Last Rate Last Admin   acetaminophen (TYLENOL) tablet 650 mg  650 mg Oral Q6H PRN Ijeoma Loor T, MD   650 mg at 06/20/22 1351   alum & mag hydroxide-simeth (MAALOX/MYLANTA) 200-200-20 MG/5ML suspension 30 mL  30 mL Oral Q4H PRN Janaki Exley T, MD   30 mL at 04/26/22 8416   aspirin EC tablet 81 mg  81 mg Oral Daily Kennidy Lamke, [name], MD   81 mg at 07/27/22 0911   atorvastatin (LIPITOR) tablet 20 mg  20 mg Oral Daily Oveda Dadamo T, MD   20 mg at 07/27/22 0912   atropine 1 % ophthalmic solution 1 drop  1 drop Sublingual QID [name], MD   1 drop at 07/27/22 1208   benztropine (COGENTIN) tablet 0.5 mg  0.5 mg Oral BID Abdulraheem Pineo T, MD   0.5 mg at 07/27/22 0911   clonazePAM (KLONOPIN) tablet 1 mg  1 mg Oral TID PRN He, [name], MD   1 mg at 07/12/22 0831   cloZAPine (CLOZARIL) tablet 300 mg  300 mg Oral QHS Timira Bieda T, MD   300 mg at 07/26/22 2118  diphenhydrAMINE (BENADRYL) capsule 50 mg  50 mg Oral Q6H PRN Almeda Ezra T, MD   50 mg at 07/16/22 1141   Or   diphenhydrAMINE (BENADRYL) injection 50 mg  50 mg Intramuscular Q6H PRN Nashalie Sallis T, MD       feeding supplement (ENSURE ENLIVE / ENSURE PLUS) liquid 237 mL  237 mL Oral TID BM Clay Menser T, MD   237 mL at 07/27/22 1435   gabapentin (NEURONTIN) capsule 300 mg  300 mg Oral TID Korie Streat T, MD   300 mg at 07/27/22 1208   haloperidol (HALDOL) tablet 5 mg  5 mg Oral TID Thelton Graca T, MD   5 mg at 07/27/22 1208   haloperidol (HALDOL) tablet 5 mg  5 mg Oral Q6H PRN Alycea Segoviano, [name], MD   5 mg at 07/14/22 2100   Or   haloperidol lactate (HALDOL) injection 5 mg  5 mg Intramuscular Q6H PRN Kentrell Guettler T, MD       lisinopril (ZESTRIL) tablet 5 mg  5 mg Oral Daily  Timber Lucarelli T, MD   5 mg at 07/27/22 0911   magnesium hydroxide (MILK OF MAGNESIA) suspension 30 mL  30 mL Oral Daily PRN Rocky Gladden T, MD   30 mL at 05/02/22 4098   metoprolol succinate (TOPROL-XL) 24 hr tablet 25 mg  25 mg Oral Daily Finn Altemose T, MD   25 mg at 07/27/22 0911   paliperidone (INVEGA SUSTENNA) injection 156 mg  156 mg Intramuscular Q28 days Ronan Duecker T, MD   156 mg at 07/19/22 1029   temazepam (RESTORIL) capsule 15 mg  15 mg Oral QHS Domenik Trice T, MD   15 mg at 07/26/22 2118   ziprasidone (GEODON) injection 20 mg  20 mg Intramuscular Q12H PRN Opha Mcghee, [name], MD   20 mg at 07/08/22 1453    Lab Results: No results found for this or any previous visit (from the past 48 hour(s)).  Blood Alcohol level:  Lab Results  Component Value Date   ETH <10 07/20/2021    Metabolic Disorder Labs: Lab Results  Component Value Date   HGBA1C 5.5 04/10/2022   MPG 111.15 04/10/2022   No results found for: "PROLACTIN" Lab Results  Component Value Date   CHOL 167 11/20/2021   TRIG 107 11/20/2021   HDL 26 (L) 11/20/2021   CHOLHDL 6.4 11/20/2021   VLDL 21 11/20/2021   LDLCALC 120 (H) 11/20/2021    Physical Findings: AIMS: Facial and Oral Movements Muscles of Facial Expression: None, normal Lips and Perioral Area: None, normal Jaw: None, normal Tongue: None, normal,Extremity Movements Upper (arms, wrists, hands, fingers): None, normal Lower (legs, knees, ankles, toes): None, normal, Trunk Movements Neck, shoulders, hips: None, normal, Overall Severity Severity of abnormal movements (highest score from questions above): None, normal Incapacitation due to abnormal movements: None, normal Patient's awareness of abnormal movements (rate only patient's report): No Awareness, Dental Status Current problems with teeth and/or dentures?: No Does patient usually wear dentures?: No  CIWA:    COWS:     Musculoskeletal: Strength & Muscle Tone: within normal limits Gait &  Station: normal Patient leans: N/A  Psychiatric Specialty Exam:  Presentation  General Appearance: Appropriate for Environment; Casual; Neat; Well Groomed  Eye Contact:Fair  Speech:Slow  Speech Volume:Decreased  Handedness:Left   Mood and Affect  Mood:Anxious  Affect:Congruent   Thought Process  Thought Processes:Goal Directed  Descriptions of Associations:Circumstantial  Orientation:Partial  Thought Content:Scattered  History of Schizophrenia/Schizoaffective disorder:Yes  Duration of Psychotic Symptoms:Greater than six months  Hallucinations:No data recorded Ideas of Reference:None  Suicidal Thoughts:No data recorded Homicidal Thoughts:No data recorded  Sensorium  Memory:Remote Good  Judgment:Fair  Insight:Fair   Executive Functions  Concentration:Fair  Attention Span:Fair  Recall:Fair  Fund of Knowledge:Fair  Language:Fair   Psychomotor Activity  Psychomotor Activity:No data recorded  Assets  Assets:No data recorded  Sleep  Sleep:No data recorded   Physical Exam: Physical Exam Vitals and nursing note reviewed.  Constitutional:      Appearance: Normal appearance.  HENT:     Head: Normocephalic and atraumatic.     Mouth/Throat:     Pharynx: Oropharynx is clear.  Eyes:     Pupils: Pupils are equal, round, and reactive to light.  Cardiovascular:     Rate and Rhythm: Normal rate and regular rhythm.  Pulmonary:     Effort: Pulmonary effort is normal.     Breath sounds: Normal breath sounds.  Abdominal:     General: Abdomen is flat.     Palpations: Abdomen is soft.  Musculoskeletal:        General: Normal range of motion.  Skin:    General: Skin is warm and dry.  Neurological:     General: No focal deficit present.     Mental Status: He is alert. Mental status is at baseline.  Psychiatric:        Attention and Perception: Attention normal.        Mood and Affect: Affect is labile.        Speech: He is noncommunicative.     Review of Systems  Unable to perform ROS: Language   Blood pressure (!) 127/106, pulse 93, temperature 97.6 F (36.4 C), temperature source Oral, resp. rate 17, height 5\' 8"  (1.727 m), weight 85.8 kg, SpO2 100 %. Body mass index is 28.76 kg/m.   Treatment Plan Summary: Medication management and Plan no change to medication.  Supportive counseling and encouragement.  Continue to hope for placement.  , MD 07/27/2022, 3:03 PM

## 2022-07-27 NOTE — Progress Notes (Signed)
Patient came up to the nurses station showing this writer an old bottle of liquid soap. This Clinical research associate asked patient if he needed some more soap and he said "yeah". Patient was given two bottles of soap and went back to his room to shower.

## 2022-07-27 NOTE — Plan of Care (Addendum)
D- Patient alert and oriented to person and situation. Patient presented in a pleasant mood on assessment with no complaints to voice to this Clinical research associate. This Clinical research associate is unable to fully determine if patient is experiencing SI/HI/AVH, however, when this Clinical research associate asked patient the question, he stated "no". Patient was also asked if he was experiencing any depression and/or anxiety, and once again, he stated "no". Patient is not in any distress nor pain at this time. Per his self-inventory, with the help of staff, patient's goal for today is "discharge", in which he will "go to group" in order to achieve his goal.  A- Scheduled medications administered to patient, per MD orders. Support and encouragement provided.  Routine safety checks conducted every 15 minutes.  Patient informed to notify staff with problems or concerns.  R- No adverse drug reactions noted. Patient contracts for safety at this time. Patient compliant with medications. Patient receptive, calm, and cooperative. Patient interacts well with others on the unit.  Patient remains safe at this time.  Problem: Education: Goal: Ability to state activities that reduce stress will improve Outcome: Progressing   Problem: Coping: Goal: Ability to identify and develop effective coping behavior will improve Outcome: Progressing   Problem: Self-Concept: Goal: Ability to identify factors that promote anxiety will improve Outcome: Progressing Goal: Level of anxiety will decrease Outcome: Progressing Goal: Ability to modify response to factors that promote anxiety will improve Outcome: Progressing   Problem: Education: Goal: Knowledge of General Education information will improve Description: Including pain rating scale, medication(s)/side effects and non-pharmacologic comfort measures Outcome: Progressing   Problem: Health Behavior/Discharge Planning: Goal: Ability to manage health-related needs will improve Outcome: Progressing   Problem:  Clinical Measurements: Goal: Ability to maintain clinical measurements within normal limits will improve Outcome: Progressing Goal: Will remain free from infection Outcome: Progressing Goal: Diagnostic test results will improve Outcome: Progressing Goal: Respiratory complications will improve Outcome: Progressing Goal: Cardiovascular complication will be avoided Outcome: Progressing   Problem: Activity: Goal: Risk for activity intolerance will decrease Outcome: Progressing   Problem: Nutrition: Goal: Adequate nutrition will be maintained Outcome: Progressing   Problem: Coping: Goal: Level of anxiety will decrease Outcome: Progressing   Problem: Elimination: Goal: Will not experience complications related to bowel motility Outcome: Progressing Goal: Will not experience complications related to urinary retention Outcome: Progressing   Problem: Pain Managment: Goal: General experience of comfort will improve Outcome: Progressing   Problem: Safety: Goal: Ability to remain free from injury will improve Outcome: Progressing   Problem: Skin Integrity: Goal: Risk for impaired skin integrity will decrease Outcome: Progressing   Problem: Activity: Goal: Interest or engagement in leisure activities will improve Outcome: Progressing   Problem: Coping: Goal: Coping ability will improve Outcome: Progressing

## 2022-07-27 NOTE — Progress Notes (Signed)
Recreation Therapy Notes  Date: 07/27/2022  Time: 10:50 am    Location: Courtyard    Behavioral response: N/A   Intervention Topic: Social skills    Discussion/Intervention: Patient refused to attend group.   Clinical Observations/Feedback:  Patient refused to attend group.    Keelen Quevedo LRT/CTRS        Campbell Agramonte 07/27/2022 12:59 PM

## 2022-07-28 NOTE — Progress Notes (Signed)
[facility] MD Progress Note  07/28/2022 12:39 PM Adam [name]  MRN:  [id number] Subjective: Patient seen and chart reviewed.  No change to behavior or presentation.  No behavior problems.  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 No pertinent surgical history. Family History: History reviewed. No pertinent family history. Family Psychiatric  History: No change Social History:  Social History   Substance and Sexual Activity  Alcohol Use Not Currently     Social History   Substance and Sexual Activity  Drug Use Not Currently    Social History   Socioeconomic History   Marital status: Single    Spouse name: Not on file   Number of children: Not on file   Years of education: Not on file   Highest education level: Not on file  Occupational History   Not on file  Tobacco Use   Smoking status: Never    Passive exposure: Never   Smokeless tobacco: Never  Vaping Use   Vaping Use: Unknown  Substance and Sexual Activity   Alcohol use: Not Currently   Drug use: Not Currently   Sexual activity: Not Currently  Other Topics Concern   Not on file  Social History Narrative   Not on file   Social Determinants of Health   Financial Resource Strain: Not on file  Food Insecurity: Not on file  Transportation Needs: Not on file  Physical Activity: Not  on file  Stress: Not on file  Social Connections: Not on file   Additional Social History:  Specify valuables returned:  (none)                      Sleep: Fair  Appetite:  Fair  Current Medications: Current Facility-Administered Medications  Medication Dose Route Frequency Provider Last Rate Last Admin   acetaminophen (TYLENOL) tablet 650 mg  650 mg Oral Q6H PRN Clydia Nieves T, MD   650 mg at 06/20/22 1351   alum & mag hydroxide-simeth (MAALOX/MYLANTA) 200-200-20 MG/5ML suspension 30 mL  30 mL Oral Q4H PRN Helaine Yackel T, MD   30 mL at 04/26/22 6283   aspirin EC tablet 81 mg  81 mg Oral Daily Jesua Tamblyn, [name], MD   81 mg at 07/28/22 1517   atorvastatin (LIPITOR) tablet 20 mg  20 mg Oral Daily Binnie Droessler T, MD   20 mg at 07/28/22 0907   atropine 1 % ophthalmic solution 1 drop  1 drop Sublingual QID [name], MD   1 drop at 07/28/22 1233   benztropine (COGENTIN) tablet 0.5 mg  0.5 mg Oral BID Shynia Daleo T, MD   0.5 mg at 07/28/22 6160   clonazePAM (KLONOPIN) tablet 1 mg  1 mg Oral TID PRN He, [name], MD   1 mg at 07/12/22 0831   cloZAPine (CLOZARIL) tablet 300 mg  300 mg Oral QHS Meshell Abdulaziz, [name], MD  300 mg at 07/27/22 2121   diphenhydrAMINE (BENADRYL) capsule 50 mg  50 mg Oral Q6H PRN Lea Baine T, MD   50 mg at 07/16/22 1141   Or   diphenhydrAMINE (BENADRYL) injection 50 mg  50 mg Intramuscular Q6H PRN Lanorris Kalisz T, MD       feeding supplement (ENSURE ENLIVE / ENSURE PLUS) liquid 237 mL  237 mL Oral TID BM Khristi Schiller T, MD   237 mL at 07/28/22 0950   gabapentin (NEURONTIN) capsule 300 mg  300 mg Oral TID Jashaun Penrose T, MD   300 mg at 07/28/22 1233   haloperidol (HALDOL) tablet 5 mg  5 mg Oral TID Jakaiden Fill T, MD   5 mg at 07/28/22 1233   haloperidol (HALDOL) tablet 5 mg  5 mg Oral Q6H PRN Zoran Yankee, [name], MD   5 mg at 07/14/22 2100   Or   haloperidol lactate (HALDOL) injection 5 mg  5 mg Intramuscular Q6H PRN Asaiah Scarber T, MD       lisinopril  (ZESTRIL) tablet 5 mg  5 mg Oral Daily Jesslynn Kruck T, MD   5 mg at 07/28/22 1660   magnesium hydroxide (MILK OF MAGNESIA) suspension 30 mL  30 mL Oral Daily PRN Dinora Hemm T, MD   30 mL at 05/02/22 6301   metoprolol succinate (TOPROL-XL) 24 hr tablet 25 mg  25 mg Oral Daily Gamal Todisco T, MD   25 mg at 07/28/22 0906   paliperidone (INVEGA SUSTENNA) injection 156 mg  156 mg Intramuscular Q28 days Lashante Fryberger T, MD   156 mg at 07/19/22 1029   temazepam (RESTORIL) capsule 15 mg  15 mg Oral QHS Divina Neale T, MD   15 mg at 07/27/22 2121   ziprasidone (GEODON) injection 20 mg  20 mg Intramuscular Q12H PRN Mialani Reicks, [name], MD   20 mg at 07/08/22 1453    Lab Results: No results found for this or any previous visit (from the past 48 hour(s)).  Blood Alcohol level:  Lab Results  Component Value Date   ETH <10 07/20/2021    Metabolic Disorder Labs: Lab Results  Component Value Date   HGBA1C 5.5 04/10/2022   MPG 111.15 04/10/2022   No results found for: "PROLACTIN" Lab Results  Component Value Date   CHOL 167 11/20/2021   TRIG 107 11/20/2021   HDL 26 (L) 11/20/2021   CHOLHDL 6.4 11/20/2021   VLDL 21 11/20/2021   LDLCALC 120 (H) 11/20/2021    Physical Findings: AIMS: Facial and Oral Movements Muscles of Facial Expression: None, normal Lips and Perioral Area: None, normal Jaw: None, normal Tongue: None, normal,Extremity Movements Upper (arms, wrists, hands, fingers): None, normal Lower (legs, knees, ankles, toes): None, normal, Trunk Movements Neck, shoulders, hips: None, normal, Overall Severity Severity of abnormal movements (highest score from questions above): None, normal Incapacitation due to abnormal movements: None, normal Patient's awareness of abnormal movements (rate only patient's report): No Awareness, Dental Status Current problems with teeth and/or dentures?: No Does patient usually wear dentures?: No  CIWA:    COWS:     Musculoskeletal: Strength &  Muscle Tone: within normal limits Gait & Station: normal Patient leans: N/A  Psychiatric Specialty Exam:  Presentation  General Appearance: Appropriate for Environment; Casual; Neat; Well Groomed  Eye Contact:Fair  Speech:Slow  Speech Volume:Decreased  Handedness:Left   Mood and Affect  Mood:Anxious  Affect:Congruent   Thought Process  Thought Processes:Goal Directed  Descriptions of Associations:Circumstantial  Orientation:Partial  Thought Content:Scattered  History of Schizophrenia/Schizoaffective disorder:Yes  Duration of Psychotic Symptoms:Greater than six months  Hallucinations:No data recorded Ideas of Reference:None  Suicidal Thoughts:No data recorded Homicidal Thoughts:No data recorded  Sensorium  Memory:Remote Good  Judgment:Fair  Insight:Fair   Executive Functions  Concentration:Fair  Attention Span:Fair  Recall:Fair  Fund of Knowledge:Fair  Language:Fair   Psychomotor Activity  Psychomotor Activity:No data recorded  Assets  Assets:No data recorded  Sleep  Sleep:No data recorded   Physical Exam: Physical Exam Vitals and nursing note reviewed.  Constitutional:      Appearance: Normal appearance.  HENT:     Head: Normocephalic and atraumatic.     Mouth/Throat:     Pharynx: Oropharynx is clear.  Eyes:     Pupils: Pupils are equal, round, and reactive to light.  Cardiovascular:     Rate and Rhythm: Normal rate and regular rhythm.  Pulmonary:     Effort: Pulmonary effort is normal.     Breath sounds: Normal breath sounds.  Abdominal:     General: Abdomen is flat.     Palpations: Abdomen is soft.  Musculoskeletal:        General: Normal range of motion.  Skin:    General: Skin is warm and dry.  Neurological:     General: No focal deficit present.     Mental Status: He is alert. Mental status is at baseline.  Psychiatric:        Speech: He is noncommunicative.    Review of Systems  Unable to perform ROS:  Language   Blood pressure 108/72, pulse 99, temperature (!) 97.5 F (36.4 C), temperature source Oral, resp. rate 18, height 5\' 8"  (1.727 m), weight 85.8 kg, SpO2 100 %. Body mass index is 28.76 kg/m.   Treatment Plan Summary: Medication management and Plan Adam [name] is doing great.  No behavior problems.  No indication to make any changes to treatment plan.  Still stuck here because of lack of a place to live.  [name], MD 07/28/2022, 12:39 PM

## 2022-07-28 NOTE — Progress Notes (Signed)
No distress noted, he appears irritable and unwilling to participate in treatment care. Patient denies SI/HI/AVH, 15 minutes safety checks maintained will continue to monitor closely.

## 2022-07-28 NOTE — [facility] Group Notes (Signed)
Pt did not attend psychoeducational group. 

## 2022-07-28 NOTE — [facility] Group Notes (Signed)
Pt did not attend group. 

## 2022-07-28 NOTE — Progress Notes (Signed)
Patient has received his scheduled morning medication. Patient tolerated medication administration well, without any issues. Patient remains safe on the unit.

## 2022-07-28 NOTE — Plan of Care (Signed)
D- Patient alert and oriented. Patient presented in a pleasant mood on assessment, reporting on his self-inventory that he slept "good" last night and had no complaints to voice to this Clinical research associate. 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 When this writer asked patient if he was experiencing any signs/symptoms of depression and/or anxiety, he stated "no". This Clinical research associate is unable to fully determine if patient is experiencing any SI/HI/AVH, but when this Clinical research associate asked, patient stated "no". Patient is not in any distress nor pain at this time.   A- Scheduled medications administered to patient, per MD orders. Support and encouragement provided.  Routine safety checks conducted every 15 minutes.  Patient informed to notify staff with problems or concerns.  R- No adverse drug reactions noted. Patient contracts for safety at this time. Patient compliant with medications and treatment plan. Patient receptive, calm, and cooperative. Patient interacts well with others on the unit.  Patient remains safe at this time.  Problem: Education: Goal: Ability to state activities that reduce stress will improve Outcome: Progressing   Problem: Coping: Goal: Ability to identify and develop effective coping behavior will improve Outcome: Progressing   Problem: Self-Concept: Goal: Ability to identify factors that promote anxiety will improve Outcome: Progressing Goal: Level of anxiety will decrease Outcome: Progressing Goal: Ability to modify response to factors that promote anxiety will improve Outcome: Progressing   Problem: Education: Goal: Knowledge of General Education information will improve Description: Including pain rating scale, medication(s)/side effects and non-pharmacologic comfort measures Outcome: Progressing   Problem: Health Behavior/Discharge Planning: Goal: Ability to manage health-related needs will improve Outcome: Progressing   Problem: Clinical  Measurements: Goal: Ability to maintain clinical measurements within normal limits will improve Outcome: Progressing Goal: Will remain free from infection Outcome: Progressing Goal: Diagnostic test results will improve Outcome: Progressing Goal: Respiratory complications will improve Outcome: Progressing Goal: Cardiovascular complication will be avoided Outcome: Progressing   Problem: Activity: Goal: Risk for activity intolerance will decrease Outcome: Progressing   Problem: Nutrition: Goal: Adequate nutrition will be maintained Outcome: Progressing   Problem: Coping: Goal: Level of anxiety will decrease Outcome: Progressing   Problem: Elimination: Goal: Will not experience complications related to bowel motility Outcome: Progressing Goal: Will not experience complications related to urinary retention Outcome: Progressing   Problem: Pain Managment: Goal: General experience of comfort will improve Outcome: Progressing   Problem: Safety: Goal: Ability to remain free from injury will improve Outcome: Progressing   Problem: Skin Integrity: Goal: Risk for impaired skin integrity will decrease Outcome: Progressing   Problem: Activity: Goal: Interest or engagement in leisure activities will improve Outcome: Progressing   Problem: Coping: Goal: Coping ability will improve Outcome: Progressing

## 2022-07-28 NOTE — Progress Notes (Signed)
Patient is currently sleeping. This writer will administer morning medication once he wakes up. MD will be notified.

## 2022-07-29 NOTE — [facility] Group Notes (Signed)
[facility] Group Notes:  (Nursing/MHT/Case Management/Adjunct)  Date:  07/29/2022  Time:  9:37 PM  Type of Therapy:   Wrap up  Participation Level:  Minimal  Participation Quality:  Appropriate  Affect:  Appropriate  Cognitive:  Alert  Insight:  Improving  Engagement in Group:  Engaged and he said his name.  Modes of Intervention:  Support  Summary of Progress/Problems:  Mayra Neer 07/29/2022, 9:37 PM

## 2022-07-29 NOTE — Progress Notes (Signed)
[facility] MD Progress Note  07/29/2022 12:17 PM Adam [name]  MRN:  [id number] Subjective: Patient with schizophrenia and aphasia.  No new complaints.  No changes to behavior.  Everything stable. 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 No pertinent surgical history. Family History: History reviewed. No pertinent family history. Family Psychiatric  History: None Social History:  Social History   Substance and Sexual Activity  Alcohol Use Not Currently     Social History   Substance and Sexual Activity  Drug Use Not Currently    Social History   Socioeconomic History   Marital status: Single    Spouse name: Not on file   Number of children: Not on file   Years of education: Not on file   Highest education level: Not on file  Occupational History   Not on file  Tobacco Use   Smoking status: Never    Passive exposure: Never   Smokeless tobacco: Never  Vaping Use   Vaping Use: Unknown  Substance and Sexual Activity   Alcohol use: Not Currently   Drug use: Not Currently   Sexual activity: Not Currently  Other Topics Concern   Not on file  Social History Narrative   Not on file   Social Determinants of Health   Financial Resource Strain: Not on file  Food Insecurity: Not on file  Transportation Needs: Not on file  Physical Activity: Not on file   Stress: Not on file  Social Connections: Not on file   Additional Social History:  Specify valuables returned:  (none)                      Sleep: Fair  Appetite:  Fair  Current Medications: Current Facility-Administered Medications  Medication Dose Route Frequency Provider Last Rate Last Admin   acetaminophen (TYLENOL) tablet 650 mg  650 mg Oral Q6H PRN Omar Gayden T, MD   650 mg at 06/20/22 1351   alum & mag hydroxide-simeth (MAALOX/MYLANTA) 200-200-20 MG/5ML suspension 30 mL  30 mL Oral Q4H PRN Betsey Sossamon T, MD   30 mL at 04/26/22 8841   aspirin EC tablet 81 mg  81 mg Oral Daily Darral Rishel, [name], MD   81 mg at 07/29/22 6606   atorvastatin (LIPITOR) tablet 20 mg  20 mg Oral Daily Keyonte Cookston T, MD   20 mg at 07/29/22 3016   atropine 1 % ophthalmic solution 1 drop  1 drop Sublingual QID [name], MD   1 drop at 07/29/22 1200   benztropine (COGENTIN) tablet 0.5 mg  0.5 mg Oral BID Karley Pho T, MD   0.5 mg at 07/29/22 0109   clonazePAM (KLONOPIN) tablet 1 mg  1 mg Oral TID PRN He, [name], MD   1 mg at 07/12/22 0831   cloZAPine (CLOZARIL) tablet 300 mg  300 mg Oral QHS Lindi Abram T, MD   300 mg at 07/28/22  2100   diphenhydrAMINE (BENADRYL) capsule 50 mg  50 mg Oral Q6H PRN Filomena Pokorney T, MD   50 mg at 07/16/22 1141   Or   diphenhydrAMINE (BENADRYL) injection 50 mg  50 mg Intramuscular Q6H PRN Trixy Loyola T, MD       feeding supplement (ENSURE ENLIVE / ENSURE PLUS) liquid 237 mL  237 mL Oral TID BM Mikaiya Tramble T, MD   237 mL at 07/29/22 1002   gabapentin (NEURONTIN) capsule 300 mg  300 mg Oral TID Shanay Woolman T, MD   300 mg at 07/29/22 1200   haloperidol (HALDOL) tablet 5 mg  5 mg Oral TID Emanual Lamountain T, MD   5 mg at 07/29/22 1200   haloperidol (HALDOL) tablet 5 mg  5 mg Oral Q6H PRN Debborah Alonge, [name], MD   5 mg at 07/14/22 2100   Or   haloperidol lactate (HALDOL) injection 5 mg  5 mg Intramuscular Q6H PRN Shaunak Kreis T, MD       lisinopril (ZESTRIL)  tablet 5 mg  5 mg Oral Daily Harlis Champoux T, MD   5 mg at 07/29/22 7741   magnesium hydroxide (MILK OF MAGNESIA) suspension 30 mL  30 mL Oral Daily PRN Ordean Fouts T, MD   30 mL at 05/02/22 2878   metoprolol succinate (TOPROL-XL) 24 hr tablet 25 mg  25 mg Oral Daily Beryle Zeitz T, MD   25 mg at 07/29/22 6767   paliperidone (INVEGA SUSTENNA) injection 156 mg  156 mg Intramuscular Q28 days Cranford Blessinger T, MD   156 mg at 07/19/22 1029   temazepam (RESTORIL) capsule 15 mg  15 mg Oral QHS Alexandrina Fiorini T, MD   15 mg at 07/28/22 2100   ziprasidone (GEODON) injection 20 mg  20 mg Intramuscular Q12H PRN Brielle Moro, [name], MD   20 mg at 07/08/22 1453    Lab Results: No results found for this or any previous visit (from the past 48 hour(s)).  Blood Alcohol level:  Lab Results  Component Value Date   ETH <10 07/20/2021    Metabolic Disorder Labs: Lab Results  Component Value Date   HGBA1C 5.5 04/10/2022   MPG 111.15 04/10/2022   No results found for: "PROLACTIN" Lab Results  Component Value Date   CHOL 167 11/20/2021   TRIG 107 11/20/2021   HDL 26 (L) 11/20/2021   CHOLHDL 6.4 11/20/2021   VLDL 21 11/20/2021   LDLCALC 120 (H) 11/20/2021    Physical Findings: AIMS: Facial and Oral Movements Muscles of Facial Expression: None, normal Lips and Perioral Area: None, normal Jaw: None, normal Tongue: None, normal,Extremity Movements Upper (arms, wrists, hands, fingers): None, normal Lower (legs, knees, ankles, toes): None, normal, Trunk Movements Neck, shoulders, hips: None, normal, Overall Severity Severity of abnormal movements (highest score from questions above): None, normal Incapacitation due to abnormal movements: None, normal Patient's awareness of abnormal movements (rate only patient's report): No Awareness, Dental Status Current problems with teeth and/or dentures?: No Does patient usually wear dentures?: No  CIWA:    COWS:     Musculoskeletal: Strength & Muscle Tone:  within normal limits Gait & Station: normal Patient leans: N/A  Psychiatric Specialty Exam:  Presentation  General Appearance: Appropriate for Environment; Casual; Neat; Well Groomed  Eye Contact:Fair  Speech:Slow  Speech Volume:Decreased  Handedness:Left   Mood and Affect  Mood:Anxious  Affect:Congruent   Thought Process  Thought Processes:Goal Directed  Descriptions of Associations:Circumstantial  Orientation:Partial  Thought Content:Scattered  History  of Schizophrenia/Schizoaffective disorder:Yes  Duration of Psychotic Symptoms:Greater than six months  Hallucinations:No data recorded Ideas of Reference:None  Suicidal Thoughts:No data recorded Homicidal Thoughts:No data recorded  Sensorium  Memory:Remote Good  Judgment:Fair  Insight:Fair   Executive Functions  Concentration:Fair  Attention Span:Fair  Recall:Fair  Fund of Knowledge:Fair  Language:Fair   Psychomotor Activity  Psychomotor Activity:No data recorded  Assets  Assets:No data recorded  Sleep  Sleep:No data recorded   Physical Exam: Physical Exam Vitals and nursing note reviewed.  Constitutional:      Appearance: Normal appearance.  HENT:     Head: Normocephalic and atraumatic.     Mouth/Throat:     Pharynx: Oropharynx is clear.  Eyes:     Pupils: Pupils are equal, round, and reactive to light.  Cardiovascular:     Rate and Rhythm: Normal rate and regular rhythm.  Pulmonary:     Effort: Pulmonary effort is normal.     Breath sounds: Normal breath sounds.  Abdominal:     General: Abdomen is flat.     Palpations: Abdomen is soft.  Musculoskeletal:        General: Normal range of motion.  Skin:    General: Skin is warm and dry.  Neurological:     General: No focal deficit present.     Mental Status: He is alert. Mental status is at baseline.  Psychiatric:        Attention and Perception: He is inattentive.        Speech: He is noncommunicative.    Review of  Systems  Constitutional: Negative.   HENT: Negative.    Eyes: Negative.   Respiratory: Negative.    Cardiovascular: Negative.   Gastrointestinal: Negative.   Musculoskeletal: Negative.   Skin: Negative.   Neurological: Negative.   Psychiatric/Behavioral: Negative.     Blood pressure (!) 116/98, pulse (!) 104, temperature 97.9 F (36.6 C), temperature source Oral, resp. rate 18, height 5\' 8"  (1.727 m), weight 85.8 kg, SpO2 98 %. Body mass index is 28.76 kg/m.   Treatment Plan Summary: Plan stable.  No need for hospitalization.  Awaiting placement  , MD 07/29/2022, 12:17 PM

## 2022-07-29 NOTE — Progress Notes (Addendum)
No distress noted, he appears irritable and unwilling to participate in treatment care. Patient was offered emotional support and encouragement, he denies SI/HI/AVH, 15 minutes safety checks maintained will continue to monitor closely.

## 2022-07-29 NOTE — Progress Notes (Signed)
   07/29/22 2300  Psych Admission Type (Psych Patients Only)  Admission Status Involuntary  Psychosocial Assessment  Patient Complaints None  Eye Contact Fair;Glaring  Facial Expression Animated  Affect Preoccupied  Speech Aphasic  Interaction Childlike  Motor Activity Shuffling  Appearance/Hygiene Unremarkable  Behavior Characteristics Cooperative  Mood Pleasant  Aggressive Behavior  Effect No apparent injury  Thought Process  Coherency WDL  Content WDL  Delusions None reported or observed  Perception WDL  Hallucination None reported or observed  Judgment WDL  Confusion None  Danger to Self  Current suicidal ideation? Denies  Danger to Others  Danger to Others None reported or observed  Danger to Others Abnormal  Harmful Behavior to others No threats or harm toward other people  Destructive Behavior No threats or harm toward property

## 2022-07-29 NOTE — [facility] Group Notes (Signed)
07/29/2022 1:00pm  Type of Group and Topic: Psychoeducational Group:  Wellness  Participation Level:  active  Description of Group  Wellness group introduces the topic and its focus on developing healthy habits across the spectrum and its relationship to a decrease in hospital admissions.  Six areas of wellness are discussed: physical, social spiritual, intellectual, occupational, and emotional.  Patients are asked to consider their current wellness habits and to identify areas of wellness where they are interested and able to focus on improvements.    Therapeutic Goals Patients will understand components of wellness and how they can positively impact overall health.  Patients will identify areas of wellness where they have developed good habits. Patients will identify areas of wellness where they would like to make improvements.    Summary of Patient Progress: Pt came to group about 30 minutes late, enthusiastic but unable to make appropriate contribution to discussion, unable to engage in the topic.  Pt was not disruptive for the most part, friendly and pleasant.        Therapeutic Modalities: Cognitive Behavioral Therapy Psychoeducation    Lorri Frederick, LCSW

## 2022-07-29 NOTE — Plan of Care (Signed)
Patient pleasant and cooperative in the unit. Singing loud at times. No aggressive behaviors noted. Compliant with medications. Appetite and energy level good. ADLs maintained. Support and encouragement given.

## 2022-07-30 NOTE — Progress Notes (Signed)
[facility] MD Progress Note  07/30/2022 11:25 AM Adam [name]  MRN:  [id number] Subjective: Patient seen and chart reviewed.  No change to behavior.  Generally calm.  No worse than usual.  Not causing any trouble.  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 No pertinent surgical history. Family History: History reviewed. No pertinent family history. Family Psychiatric  History: See previous Social History:  Social History   Substance and Sexual Activity  Alcohol Use Not Currently     Social History   Substance and Sexual Activity  Drug Use Not Currently    Social History   Socioeconomic History   Marital status: Single    Spouse name: Not on file   Number of children: Not on file   Years of education: Not on file   Highest education level: Not on file  Occupational History   Not on file  Tobacco Use   Smoking status: Never    Passive exposure: Never   Smokeless tobacco: Never  Vaping Use   Vaping Use: Unknown  Substance and Sexual Activity   Alcohol use: Not Currently   Drug use: Not Currently   Sexual activity: Not Currently  Other Topics Concern   Not on file  Social History Narrative   Not on file   Social Determinants of Health   Financial Resource Strain: Not on file  Food Insecurity: Not on file   Transportation Needs: Not on file  Physical Activity: Not on file  Stress: Not on file  Social Connections: Not on file   Additional Social History:  Specify valuables returned:  (none)                      Sleep: Fair  Appetite:  Fair  Current Medications: Current Facility-Administered Medications  Medication Dose Route Frequency Provider Last Rate Last Admin   acetaminophen (TYLENOL) tablet 650 mg  650 mg Oral Q6H PRN Mckenleigh Tarlton T, MD   650 mg at 06/20/22 1351   alum & mag hydroxide-simeth (MAALOX/MYLANTA) 200-200-20 MG/5ML suspension 30 mL  30 mL Oral Q4H PRN Oaklen Thiam T, MD   30 mL at 04/26/22 0355   aspirin EC tablet 81 mg  81 mg Oral Daily Katharine Rochefort T, MD   81 mg at 07/30/22 0820   atorvastatin (LIPITOR) tablet 20 mg  20 mg Oral Daily Maclovia Uher T, MD   20 mg at 07/30/22 0820   atropine 1 % ophthalmic solution 1 drop  1 drop Sublingual QID [name], MD   1 drop at 07/30/22 9741   benztropine (COGENTIN) tablet 0.5 mg  0.5 mg Oral BID Tyjai Matuszak T, MD   0.5 mg at 07/30/22 0819   clonazePAM (KLONOPIN) tablet 1 mg  1 mg Oral TID PRN He, [name], MD   1 mg at 07/29/22 2102   cloZAPine (CLOZARIL) tablet 300 mg  300 mg  Oral QHS Walt Geathers, [name], MD   300 mg at 07/29/22 2102   diphenhydrAMINE (BENADRYL) capsule 50 mg  50 mg Oral Q6H PRN Hortence Charter T, MD   50 mg at 07/29/22 2102   Or   diphenhydrAMINE (BENADRYL) injection 50 mg  50 mg Intramuscular Q6H PRN Monifa Blanchette T, MD       feeding supplement (ENSURE ENLIVE / ENSURE PLUS) liquid 237 mL  237 mL Oral TID BM Breeona Waid T, MD   237 mL at 07/29/22 2117   gabapentin (NEURONTIN) capsule 300 mg  300 mg Oral TID Shantele Reller T, MD   300 mg at 07/30/22 0820   haloperidol (HALDOL) tablet 5 mg  5 mg Oral TID Kenyan Karnes, [name], MD   5 mg at 07/30/22 8676   haloperidol (HALDOL) tablet 5 mg  5 mg Oral Q6H PRN Jonnathan Birman T, MD   5 mg at 07/29/22 2102   Or   haloperidol lactate (HALDOL) injection 5 mg  5 mg  Intramuscular Q6H PRN Christy Friede T, MD       lisinopril (ZESTRIL) tablet 5 mg  5 mg Oral Daily Ayomide Zuleta T, MD   5 mg at 07/30/22 1950   magnesium hydroxide (MILK OF MAGNESIA) suspension 30 mL  30 mL Oral Daily PRN Tzippy Testerman T, MD   30 mL at 05/02/22 9326   metoprolol succinate (TOPROL-XL) 24 hr tablet 25 mg  25 mg Oral Daily Lacresha Fusilier T, MD   25 mg at 07/30/22 0820   paliperidone (INVEGA SUSTENNA) injection 156 mg  156 mg Intramuscular Q28 days Isabel Ardila T, MD   156 mg at 07/19/22 1029   temazepam (RESTORIL) capsule 15 mg  15 mg Oral QHS Deyjah Kindel T, MD   15 mg at 07/29/22 2102   ziprasidone (GEODON) injection 20 mg  20 mg Intramuscular Q12H PRN Kanden Carey, [name], MD   20 mg at 07/08/22 1453    Lab Results: No results found for this or any previous visit (from the past 48 hour(s)).  Blood Alcohol level:  Lab Results  Component Value Date   ETH <10 07/20/2021    Metabolic Disorder Labs: Lab Results  Component Value Date   HGBA1C 5.5 04/10/2022   MPG 111.15 04/10/2022   No results found for: "PROLACTIN" Lab Results  Component Value Date   CHOL 167 11/20/2021   TRIG 107 11/20/2021   HDL 26 (L) 11/20/2021   CHOLHDL 6.4 11/20/2021   VLDL 21 11/20/2021   LDLCALC 120 (H) 11/20/2021    Physical Findings: AIMS: Facial and Oral Movements Muscles of Facial Expression: None, normal Lips and Perioral Area: None, normal Jaw: None, normal Tongue: None, normal,Extremity Movements Upper (arms, wrists, hands, fingers): None, normal Lower (legs, knees, ankles, toes): None, normal, Trunk Movements Neck, shoulders, hips: None, normal, Overall Severity Severity of abnormal movements (highest score from questions above): None, normal Incapacitation due to abnormal movements: None, normal Patient's awareness of abnormal movements (rate only patient's report): No Awareness, Dental Status Current problems with teeth and/or dentures?: No Does patient usually wear dentures?:  No  CIWA:    COWS:     Musculoskeletal: Strength & Muscle Tone: within normal limits Gait & Station: normal Patient leans: N/A  Psychiatric Specialty Exam:  Presentation  General Appearance: Appropriate for Environment; Casual; Neat; Well Groomed  Eye Contact:Fair  Speech:Slow  Speech Volume:Decreased  Handedness:Left   Mood and Affect  Mood:Anxious  Affect:Congruent   Thought Process  Thought Processes:Goal  Directed  Descriptions of Associations:Circumstantial  Orientation:Partial  Thought Content:Scattered  History of Schizophrenia/Schizoaffective disorder:Yes  Duration of Psychotic Symptoms:Greater than six months  Hallucinations:No data recorded Ideas of Reference:None  Suicidal Thoughts:No data recorded Homicidal Thoughts:No data recorded  Sensorium  Memory:Remote Good  Judgment:Fair  Insight:Fair   Executive Functions  Concentration:Fair  Attention Span:Fair  [place]   Psychomotor Activity  Psychomotor Activity:No data recorded  Assets  Assets:No data recorded  Sleep  Sleep:No data recorded   Physical Exam: Physical Exam Vitals and nursing note reviewed.  Constitutional:      Appearance: Normal appearance.  HENT:     Head: Normocephalic and atraumatic.     Mouth/Throat:     Pharynx: Oropharynx is clear.  Eyes:     Pupils: Pupils are equal, round, and reactive to light.  Cardiovascular:     Rate and Rhythm: Normal rate and regular rhythm.  Pulmonary:     Effort: Pulmonary effort is normal.     Breath sounds: Normal breath sounds.  Abdominal:     General: Abdomen is flat.     Palpations: Abdomen is soft.  Musculoskeletal:        General: Normal range of motion.  Skin:    General: Skin is warm and dry.  Neurological:     General: No focal deficit present.     Mental Status: He is alert. Mental status is at baseline.  Psychiatric:        Mood and Affect: Mood normal.         Thought Content: Thought content normal.    Review of Systems  Unable to perform ROS: Language  Constitutional: Negative.   HENT: Negative.    Eyes: Negative.   Respiratory: Negative.    Cardiovascular: Negative.   Gastrointestinal: Negative.   Musculoskeletal: Negative.   Skin: Negative.   Neurological: Negative.    Blood pressure (!) 116/98, pulse (!) 104, temperature 97.9 F (36.6 C), temperature source Oral, resp. rate 18, height 5\' 8"  (1.727 m), weight 85.8 kg, SpO2 98 %. Body mass index is 28.76 kg/m.   Treatment Plan Summary: Medication management and Plan no change to treatment plan.  Continue current medication.  Continue waiting for placement  Alethia Berthold, MD 07/30/2022, 11:25 AM

## 2022-07-30 NOTE — Progress Notes (Addendum)
Recreation Therapy Notes  Date: 07/30/2022   Time: 10:50 am     Location: Craft room    Behavioral response: N/A   Intervention Topic: Relaxation     Discussion/Intervention: Patient refused to attend group.    Clinical Observations/Feedback:  Patient refused to attend group.     Antolin LRT/CTRS        Donique Hammonds 07/30/2022 12:30 PM

## 2022-07-30 NOTE — Plan of Care (Signed)
Patient appropriate with staff & peers.No aggressive behaviors noted. Appetite and energy level good. Compliant with meds and meals. Support and encouragement given.

## 2022-07-30 NOTE — [facility] Group Notes (Signed)
[facility] Group Notes:  (Nursing/MHT/Case Management/Adjunct)  Date:  07/30/2022  Time:  10:23 AM  Type of Therapy:   Community Meeting  Participation Level:  Did Not Attend   Lynelle Smoke [facility] 07/30/2022, 10:23 AM

## 2022-07-30 NOTE — TOC Progression Note (Signed)
Transition of Care [facility]) - Progression Note    Patient Details  Name: Adam [name] MRN: [id number] Date of Birth: 1961/07/12  [facility]) CM/SW Contact  Margarito Liner, LCSW Phone Number: 07/30/2022, 3:16 PM  Clinical Narrative: No updates on placement. TOC supervisor continues to present patient on difficult to place meetings. Psychiatrist asked CSW last week about potential to return to [facility]. Per chart review, [name], LCSW put in a note on 8/10 stating that she spoke with [place] at [facility] and they will not consider taking him back.    Expected Discharge Plan and Services                                                 Social Determinants of Health (SDOH) Interventions    Readmission Risk Interventions     No data to display

## 2022-07-31 DIAGNOSIS — F209 Schizophrenia, unspecified: Secondary | ICD-10-CM

## 2022-07-31 NOTE — Progress Notes (Signed)
Pt denies SI/HI/AVH and verbally agrees to approach staff if these become apparent or before harming themselves/others. Rates depression 0/10. Rates anxiety 0/10. Rates pain 0/10. Pt was in his room for most of the morning. Pt seemed to be irritable this morning and when he came out was yelling for what seemed like he was getting frustrated for not being able to say what he wanted to. Pt has been pacing in the afternoon. Pt will knock on the glass when he wants something. Pt will say random words at different points like, "Im pregnant" or "dogs." Pt will also stand at the glass for long periods pacing. Pt takes medications after some patience. Scheduled medications administered to pt, per MD orders. RN provided support and encouragement to pt. Q15 min safety checks implemented and continued. Pt safe on the unit. RN will continue to monitor and intervene as needed.  Problem: Coping: Goal: Level of anxiety will decrease Outcome: Progressing   Problem: Skin Integrity: Goal: Risk for impaired skin integrity will decrease Outcome: Progressing    07/31/22 0849  Psych Admission Type (Psych Patients Only)  Admission Status Involuntary  Psychosocial Assessment  Patient Complaints Agitation  Eye Contact Fair  Facial Expression Sad  Affect Preoccupied  Speech Aphasic  Interaction Childlike;Demanding  Motor Activity Shuffling  Appearance/Hygiene Unremarkable  Behavior Characteristics Cooperative  Mood Pleasant;Sad  Thought Process  Coherency WDL  Content Preoccupation  Delusions None reported or observed  Perception WDL  Hallucination None reported or observed  Judgment Impaired  Confusion None  Danger to Self  Current suicidal ideation? Denies  Danger to Others  Danger to Others None reported or observed  Danger to Others Abnormal  Harmful Behavior to others No threats or harm toward other people  Destructive Behavior No threats or harm toward property

## 2022-07-31 NOTE — Group Note (Deleted)
LCSW Group Therapy Note  Group Date: 07/31/2022 Start Time: 1300 End Time: 1400   Type of Therapy and Topic:  Group Therapy: Using "I" Statements  Participation Level:  {[facility] PARTICIPATION ONGEX:52841}  Description of Group:  Patients were asked to provide details of some interpersonal conflicts they have experienced. Patients were then educated about "I" statements, communication which focuses on feelings or views of the speaker rather than what the other person is doing. T group members were asked to reflect on past conflicts and to provide specific examples for utilizing "I" statements.  Therapeutic Goals:  1. Patients will verbalize understanding of ineffective communication and effective communication. 2. Patients will be able to empathize with whom they are having conflict. 3. Patients will practice effective communication in the form of "I" statements.    Summary of Patient Progress:  *** shared ***. The patient was ***present/active throughout the session and proved open to feedback from CSW and peers. Patient demonstrated *** insight into the subject matter, was respectful of peers, and was present throughout the entire session.  Therapeutic Modalities:   Cognitive Behavioral Therapy Solution-Focused Therapy    Corky Crafts, [place] 07/31/2022  2:18 PM

## 2022-07-31 NOTE — Progress Notes (Signed)
[facility] MD Progress Note  07/31/2022 10:47 AM Adam [name]  MRN:  [id number]  Subjective: Follow up for a client with schizophrenia and cognitive issues related to a brain injury, awaiting placement.  He is calm today and spends much time in his room doing activities typically related to music.  [name] will let staff know when he needs or wants something.  Sleep was good along with appetite, mood is stable with no changes.  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 No pertinent surgical history. Family History: History reviewed. No pertinent family history. Family Psychiatric  History: See previous Social History:  Social History   Substance and Sexual Activity  Alcohol Use Not Currently     Social History   Substance and Sexual Activity  Drug Use Not Currently    Social History   Socioeconomic History   Marital status: Single    Spouse name: Not on file   Number of children: Not on file   Years of education: Not on file   Highest education level: Not on file  Occupational History   Not on file  Tobacco Use   Smoking status: Never    Passive exposure: Never   Smokeless tobacco: Never  Vaping Use   Vaping Use: Unknown  Substance and Sexual Activity   Alcohol use: Not Currently   Drug use: Not Currently   Sexual activity: Not Currently  Other Topics Concern    Not on file  Social History Narrative   Not on file   Social Determinants of Health   Financial Resource Strain: Not on file  Food Insecurity: Not on file  Transportation Needs: Not on file  Physical Activity: Not on file  Stress: Not on file  Social Connections: Not on file   Additional Social History:  Looking for placement  Sleep: Good  Appetite:  Good  Current Medications: Current Facility-Administered Medications  Medication Dose Route Frequency Provider Last Rate Last Admin   acetaminophen (TYLENOL) tablet 650 mg  650 mg Oral Q6H PRN [name], [name], MD   650 mg at 06/20/22 1351   alum & mag hydroxide-simeth (MAALOX/MYLANTA) 200-200-20 MG/5ML suspension 30 mL  30 mL Oral Q4H PRN [name], [name], MD   30 mL at 04/26/22 7412   aspirin EC tablet 81 mg  81 mg Oral Daily [name], [name], MD   81 mg at 07/31/22 0849   atorvastatin (LIPITOR) tablet 20 mg  20 mg Oral Daily [name], [name], MD   20 mg at 07/31/22 0849   atropine 1 % ophthalmic solution 1 drop  1 drop Sublingual QID [name], MD   1 drop at 07/31/22 0854   benztropine (COGENTIN) tablet 0.5 mg  0.5 mg Oral BID [name], [name], MD   0.5 mg at 07/31/22 0851   clonazePAM (KLONOPIN) tablet 1 mg  1 mg Oral TID PRN He, [name], MD   1 mg at 07/29/22 2102  cloZAPine (CLOZARIL) tablet 300 mg  300 mg Oral QHS [name], [name], MD   300 mg at 07/30/22 2045   diphenhydrAMINE (BENADRYL) capsule 50 mg  50 mg Oral Q6H PRN [name], [name], MD   50 mg at 07/29/22 2102   Or   diphenhydrAMINE (BENADRYL) injection 50 mg  50 mg Intramuscular Q6H PRN [name], [name], MD       feeding supplement (ENSURE ENLIVE / ENSURE PLUS) liquid 237 mL  237 mL Oral TID BM [name], [name], MD   237 mL at 07/31/22 0939   gabapentin (NEURONTIN) capsule 300 mg  300 mg Oral TID [name], [name], MD   300 mg at 07/31/22 0849   haloperidol (HALDOL) tablet 5 mg  5 mg Oral TID [name], [name], MD   5 mg at 07/31/22 0849   haloperidol (HALDOL) tablet 5 mg  5 mg  Oral Q6H PRN [name], [name], MD   5 mg at 07/29/22 2102   Or   haloperidol lactate (HALDOL) injection 5 mg  5 mg Intramuscular Q6H PRN [name], [name], MD       lisinopril (ZESTRIL) tablet 5 mg  5 mg Oral Daily [name], [name], MD   5 mg at 07/31/22 7412   magnesium hydroxide (MILK OF MAGNESIA) suspension 30 mL  30 mL Oral Daily PRN [name], [name], MD   30 mL at 05/02/22 8786   metoprolol succinate (TOPROL-XL) 24 hr tablet 25 mg  25 mg Oral Daily [name], [name], MD   25 mg at 07/31/22 0853   paliperidone (INVEGA SUSTENNA) injection 156 mg  156 mg Intramuscular Q28 days [name], [name], MD   156 mg at 07/19/22 1029   temazepam (RESTORIL) capsule 15 mg  15 mg Oral QHS [name], [name], MD   15 mg at 07/30/22 2045   ziprasidone (GEODON) injection 20 mg  20 mg Intramuscular Q12H PRN [name], [name], MD   20 mg at 07/08/22 1453    Lab Results: No results found for this or any previous visit (from the past 48 hour(s)).  Blood Alcohol level:  Lab Results  Component Value Date   ETH <10 07/20/2021    Metabolic Disorder Labs: Lab Results  Component Value Date   HGBA1C 5.5 04/10/2022   MPG 111.15 04/10/2022   No results found for: "PROLACTIN" Lab Results  Component Value Date   CHOL 167 11/20/2021   TRIG 107 11/20/2021   HDL 26 (L) 11/20/2021   CHOLHDL 6.4 11/20/2021   VLDL 21 11/20/2021   LDLCALC 120 (H) 11/20/2021    Physical Findings: AIMS: Facial and Oral Movements Muscles of Facial Expression: None, normal Lips and Perioral Area: None, normal Jaw: None, normal Tongue: None, normal,Extremity Movements Upper (arms, wrists, hands, fingers): None, normal Lower (legs, knees, ankles, toes): None, normal, Trunk Movements Neck, shoulders, hips: None, normal, Overall Severity Severity of abnormal movements (highest score from questions above): None, normal Incapacitation due to abnormal movements: None, normal Patient's awareness of abnormal movements (rate only patient's report):  No Awareness, Dental Status Current problems with teeth and/or dentures?: No Does patient usually wear dentures?: No  CIWA:    COWS:     Musculoskeletal: Strength & Muscle Tone: within normal limits Gait & Station: normal Patient leans: N/A  Psychiatric Specialty Exam: Physical Exam Vitals and nursing note reviewed.  Constitutional:      Appearance: Normal appearance.  HENT:     Head: Normocephalic and atraumatic.  Mouth/Throat:     Pharynx: Oropharynx is clear.  Eyes:     Pupils: Pupils are equal, round, and reactive to light.  Cardiovascular:     Rate and Rhythm: Normal rate and regular rhythm.  Pulmonary:     Effort: Pulmonary effort is normal.     Breath sounds: Normal breath sounds.  Abdominal:     General: Abdomen is flat.     Palpations: Abdomen is soft.  Musculoskeletal:        General: Normal range of motion.  Skin:    General: Skin is warm and dry.  Neurological:     General: No focal deficit present.     Mental Status: He is alert. Mental status is at baseline.  Psychiatric:        Mood and Affect: Mood normal.        Thought Content: Thought content normal.     Review of Systems  Unable to perform ROS: Language  Constitutional: Negative.   HENT: Negative.    Eyes: Negative.   Respiratory: Negative.    Cardiovascular: Negative.   Gastrointestinal: Negative.   Musculoskeletal: Negative.   Skin: Negative.   Neurological: Negative.     Blood pressure 108/80, pulse 97, temperature 98 F (36.7 C), temperature source Oral, resp. rate 18, height 5\' 8"  (1.727 m), weight 85.8 kg, SpO2 100 %.Body mass index is 28.76 kg/m.  General Appearance: Casual  Eye Contact:  Good  Speech:  Garbled  Volume:  Normal  Mood:  Euthymic  Affect:  Congruent  Thought Process:  Coherent and Descriptions of Associations: Intact  Orientation:  Full (Time, Place, and Person)  Thought Content:  Logical  Suicidal Thoughts:  No  Homicidal Thoughts:  No  Memory:   Immediate;   Fair Recent;   Fair Remote;   Fair  Judgement:  Fair  Insight:  Fair  Psychomotor Activity:  Normal  Concentration:  Concentration: Fair and Attention Span: Fair  Recall:  of Knowledge:  Fair  Language:  Fair  Akathisia:  No  Handed:  Right  AIMS (if indicated):     Assets:  Leisure Time Resilience  ADL's:  Intact  Cognition:  Impaired,  Mild  Sleep:  Number of Hours: 5.15      Physical Exam: Physical Exam Vitals and nursing note reviewed.  Constitutional:      Appearance: Normal appearance.  HENT:     Head: Normocephalic and atraumatic.     Mouth/Throat:     Pharynx: Oropharynx is clear.  Eyes:     Pupils: Pupils are equal, round, and reactive to light.  Cardiovascular:     Rate and Rhythm: Normal rate and regular rhythm.  Pulmonary:     Effort: Pulmonary effort is normal.     Breath sounds: Normal breath sounds.  Abdominal:     General: Abdomen is flat.     Palpations: Abdomen is soft.  Musculoskeletal:        General: Normal range of motion.  Skin:    General: Skin is warm and dry.  Neurological:     General: No focal deficit present.     Mental Status: He is alert. Mental status is at baseline.  Psychiatric:        Mood and Affect: Mood normal.        Thought Content: Thought content normal.     Blood pressure 108/80, pulse 97, temperature 98 F (36.7 C), temperature source Oral, resp. rate 18, height 5\' 8"  (1.727  m), weight 85.8 kg, SpO2 100 %. Body mass index is 28.76 kg/m.   Treatment Plan Summary: Medication management and Plan no change to treatment plan.  Continue current medication.  Continue waiting for placement Schizophrenia, chronic: Invega sustenna 156 mg monthly Haldol 5 mg TID Clozaril 300 mg daily at bedtime  EPS: Cogentin 0.5 mg BID  Anxiety: Gabapentin 300 mg TID  Insomnia: Restoril 15 mg daily  Nanine Means, NP 07/31/2022, 10:47 AM

## 2022-07-31 NOTE — Plan of Care (Signed)
  Problem: Coping: Goal: Ability to identify and develop effective coping behavior will improve Outcome: Progressing   

## 2022-07-31 NOTE — Progress Notes (Signed)
Patient pleasant and cooperative most of shift. Periodic episodes of agitation towards peer. Medication compliant. Appropriate with staff. Meds given as prescribed. Pt remains safe on unit with q 15 min checks.

## 2022-07-31 NOTE — Progress Notes (Signed)
Recreation Therapy Notes   Date: 07/31/2022  Time: 10:15 am    Location: Courtyard     Behavioral response: N/A   Intervention Topic: Leisure     Discussion/Intervention: Patient refused to attend group.   Clinical Observations/Feedback:  Patient refused to attend group.    Jaylissa Felty LRT/CTRS        Adam [name] 07/31/2022 12:50 PM

## 2022-08-01 NOTE — Progress Notes (Signed)
Patient has been isolative to his room.  Will occasionally come out and stand at the nurses station and smile and point. Other times he has been irritable and agitated by other patients on the unit. He did come for snacks and medication with no issue.Will continue to offer support and encouragement.  Q15 minute safety checks in place.        C Butler-Nicholson, LPN

## 2022-08-01 NOTE — Progress Notes (Signed)
Recreation Therapy Notes  Date: 08/01/2022  Time: 11:05 am    Location: Courtyard     Behavioral response: N/A   Intervention Topic: Social Skills    Discussion/Intervention: Patient refused to attend group.   Clinical Observations/Feedback:  Patient refused to attend group.    Vivia Rosenburg LRT/CTRS        Tayven Renteria 08/01/2022 12:46 PM

## 2022-08-01 NOTE — Progress Notes (Signed)
Pt denies SI/HI/AVH and verbally agrees to approach staff if these become apparent or before harming themselves/others. Pt denies everything but seemed to be anxious and irritable in the morning but has been pleasant the rest of the day. Pt has been heard singing in his room multiple times. Pt will come up to show staff and say one thing and go right back to his room. Pt was moved to another room due to moving another pt. Pt was actually happy to be moved. Scheduled medications administered to pt, per MD orders. RN provided support and encouragement to pt. Q15 min safety checks implemented and continued. Pt safe on the unit. RN will continue to monitor and intervene as needed.  Problem: Clinical Measurements: Goal: Will remain free from infection Outcome: Progressing   Problem: Nutrition: Goal: Adequate nutrition will be maintained Outcome: Progressing   Problem: Coping: Goal: Level of anxiety will decrease Outcome: Progressing   08/01/22 0842  Psych Admission Type (Psych Patients Only)  Admission Status Involuntary  Psychosocial Assessment  Patient Complaints None  Eye Contact Fair  Facial Expression Animated;Anxious  Affect Labile  Speech Aphasic  Interaction Childlike;Needy  Motor Activity Shuffling  Appearance/Hygiene Unremarkable  Behavior Characteristics Cooperative;Appropriate to situation;Anxious  Mood Anxious;Labile  Thought Process  Coherency WDL  Content Preoccupation  Delusions None reported or observed  Perception WDL  Hallucination None reported or observed  Judgment Impaired  Confusion None  Danger to Self  Current suicidal ideation? Denies  Danger to Others  Danger to Others None reported or observed  Danger to Others Abnormal  Harmful Behavior to others No threats or harm toward other people  Destructive Behavior No threats or harm toward property

## 2022-08-01 NOTE — Progress Notes (Signed)
[facility] MD Progress Note  08/01/2022 10:48 AM Adam [name]  MRN:  [id number] Subjective: No change to behavior and no change to complaints.  No physical distress. 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 No pertinent surgical history. Family History: History reviewed. No pertinent family history. Family Psychiatric  History: See previous Social History:  Social History   Substance and Sexual Activity  Alcohol Use Not Currently     Social History   Substance and Sexual Activity  Drug Use Not Currently    Social History   Socioeconomic History   Marital status: Single    Spouse name: Not on file   Number of children: Not on file   Years of education: Not on file   Highest education level: Not on file  Occupational History   Not on file  Tobacco Use   Smoking status: Never    Passive exposure: Never   Smokeless tobacco: Never  Vaping Use   Vaping Use: Unknown  Substance and Sexual Activity   Alcohol use: Not Currently   Drug use: Not Currently   Sexual activity: Not Currently  Other Topics Concern   Not on file  Social History Narrative   Not on file   Social Determinants of Health   Financial Resource Strain: Not on file  Food Insecurity: Not on file  Transportation Needs: Not on file  Physical Activity: Not on file  Stress: Not on file  Social Connections: Not on file    Additional Social History:  Specify valuables returned:  (none)                      Sleep: Fair  Appetite:  Fair  Current Medications: Current Facility-Administered Medications  Medication Dose Route Frequency Provider Last Rate Last Admin   acetaminophen (TYLENOL) tablet 650 mg  650 mg Oral Q6H PRN Shayan Bramhall T, MD   650 mg at 06/20/22 1351   alum & mag hydroxide-simeth (MAALOX/MYLANTA) 200-200-20 MG/5ML suspension 30 mL  30 mL Oral Q4H PRN Franklin Clapsaddle T, MD   30 mL at 04/26/22 8676   aspirin EC tablet 81 mg  81 mg Oral Daily Kelsey Edman T, MD   81 mg at 08/01/22 1950   atorvastatin (LIPITOR) tablet 20 mg  20 mg Oral Daily Mahsa Hanser T, MD   20 mg at 08/01/22 0842   atropine 1 % ophthalmic solution 1 drop  1 drop Sublingual QID [name], MD   1 drop at 08/01/22 0845   benztropine (COGENTIN) tablet 0.5 mg  0.5 mg Oral BID Lynnzie Blackson T, MD   0.5 mg at 08/01/22 9326   clonazePAM (KLONOPIN) tablet 1 mg  1 mg Oral TID PRN He, [name], MD   1 mg at 07/31/22 1608   cloZAPine (CLOZARIL) tablet 300 mg  300 mg Oral QHS Meleni Delahunt T, MD   300 mg at 07/31/22 2132   diphenhydrAMINE (BENADRYL) capsule 50 mg  50 mg Oral Q6H PRN Camisha Srey T, MD   50 mg at 07/31/22 1234   Or   diphenhydrAMINE (BENADRYL) injection 50 mg  50 mg Intramuscular Q6H PRN Reise Gladney T, MD       feeding supplement (ENSURE ENLIVE / ENSURE PLUS) liquid 237 mL  237 mL Oral TID BM Almena Hokenson T, MD   237 mL at 08/01/22 0957   gabapentin (NEURONTIN) capsule 300 mg  300 mg Oral TID Caleah Tortorelli T, MD   300 mg at 08/01/22 7169   haloperidol (HALDOL) tablet 5 mg  5 mg Oral TID Lional Icenogle T, MD   5 mg at 08/01/22 6789   haloperidol (HALDOL) tablet 5 mg  5 mg Oral Q6H PRN Clavin Ruhlman T, MD   5 mg at 07/29/22 2102   Or   haloperidol lactate (HALDOL) injection 5 mg  5 mg Intramuscular Q6H PRN Prem Coykendall T, MD       lisinopril (ZESTRIL) tablet 5 mg  5 mg Oral Daily Ily Denno T, MD   5 mg at  08/01/22 3810   magnesium hydroxide (MILK OF MAGNESIA) suspension 30 mL  30 mL Oral Daily PRN Jomo Forand T, MD   30 mL at 05/02/22 1751   metoprolol succinate (TOPROL-XL) 24 hr tablet 25 mg  25 mg Oral Daily Khalifa Knecht T, MD   25 mg at 08/01/22 0842   paliperidone (INVEGA SUSTENNA) injection 156 mg  156 mg Intramuscular Q28 days Simpson Paulos T, MD   156 mg at 07/19/22 1029   temazepam (RESTORIL) capsule 15 mg  15 mg Oral QHS Glendel Jaggers T, MD   15 mg at 07/31/22 2132   ziprasidone (GEODON) injection 20 mg  20 mg Intramuscular Q12H PRN Amarri Michaelson, [name], MD   20 mg at 07/08/22 1453    Lab Results: No results found for this or any previous visit (from the past 48 hour(s)).  Blood Alcohol level:  Lab Results  Component Value Date   ETH <10 07/20/2021    Metabolic Disorder Labs: Lab Results  Component Value Date   HGBA1C 5.5 04/10/2022   MPG 111.15 04/10/2022   No results found for: "PROLACTIN" Lab Results  Component Value Date   CHOL 167 11/20/2021   TRIG 107 11/20/2021   HDL 26 (L) 11/20/2021   CHOLHDL 6.4 11/20/2021   VLDL 21 11/20/2021   LDLCALC 120 (H) 11/20/2021    Physical Findings: AIMS: Facial and Oral Movements Muscles of Facial Expression: None, normal Lips and Perioral Area: None, normal Jaw: None, normal Tongue: None, normal,Extremity Movements Upper (arms, wrists, hands, fingers): None, normal Lower (legs, knees, ankles, toes): None, normal, Trunk Movements Neck, shoulders, hips: None, normal, Overall Severity Severity of abnormal movements (highest score from questions above): None, normal Incapacitation due to abnormal movements: None, normal Patient's awareness of abnormal movements (rate only patient's report): No Awareness, Dental Status Current problems with teeth and/or dentures?: No Does patient usually wear dentures?: No  CIWA:    COWS:     Musculoskeletal: Strength & Muscle Tone: within normal limits Gait & Station: normal Patient leans:  N/A  Psychiatric Specialty Exam:  Presentation  General Appearance: Appropriate for Environment; Casual; Neat; Well Groomed  Eye Contact:Fair  Speech:Slow  Speech Volume:Decreased  Handedness:Left   Mood and Affect  Mood:Anxious  Affect:Congruent   Thought Process  Thought Processes:Goal Directed  Descriptions of Associations:Circumstantial  Orientation:Partial  Thought Content:Scattered  History of Schizophrenia/Schizoaffective disorder:Yes  Duration of Psychotic Symptoms:Greater than  six months  Hallucinations:No data recorded Ideas of Reference:None  Suicidal Thoughts:No data recorded Homicidal Thoughts:No data recorded  Sensorium  Memory:Remote Good  Judgment:Fair  Insight:Fair   Executive Functions  Concentration:Fair  Attention Span:Fair  Recall:Fair  Fund of Knowledge:Fair  Language:Fair   Psychomotor Activity  Psychomotor Activity:No data recorded  Assets  Assets:No data recorded  Sleep  Sleep:No data recorded   Physical Exam: Physical Exam Vitals and nursing note reviewed.  Constitutional:      Appearance: Normal appearance.  HENT:     Head: Normocephalic and atraumatic.     Mouth/Throat:     Pharynx: Oropharynx is clear.  Eyes:     Pupils: Pupils are equal, round, and reactive to light.  Cardiovascular:     Rate and Rhythm: Normal rate and regular rhythm.  Pulmonary:     Effort: Pulmonary effort is normal.     Breath sounds: Normal breath sounds.  Abdominal:     General: Abdomen is flat.     Palpations: Abdomen is soft.  Musculoskeletal:        General: Normal range of motion.  Skin:    General: Skin is warm and dry.  Neurological:     General: No focal deficit present.     Mental Status: He is alert. Mental status is at baseline.  Psychiatric:        Attention and Perception: He is inattentive.        Mood and Affect: Mood normal. Affect is inappropriate.        Speech: He is noncommunicative.    Review  of Systems  Unable to perform ROS: Language   Blood pressure 126/83, pulse 100, temperature 98 F (36.7 C), temperature source Oral, resp. rate 18, height 5\' 8"  (1.727 m), weight 85.8 kg, SpO2 100 %. Body mass index is 28.76 kg/m.   Treatment Plan Summary: Medication management and Plan no change to medication management.  Daily supportive encouragement and checking in with him.  Continue to look for discharge placement  , MD 08/01/2022, 10:48 AM

## 2022-08-01 NOTE — Progress Notes (Signed)
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 No pertinent surgical history. Family History: History reviewed. No pertinent family history. Family Psychiatric  History: See previous Social History:  Social History   Substance and Sexual Activity  Alcohol Use Not Currently     Social History   Substance and Sexual Activity  Drug Use Not Currently    Social History   Socioeconomic History   Marital status: Single    Spouse name: Not on file   Number of children: Not on file   Years of education: Not on file   Highest education level: Not on file  Occupational History   Not on file  Tobacco Use   Smoking status: Never    Passive exposure: Never   Smokeless tobacco: Never  Vaping Use   Vaping Use: Unknown  Substance and Sexual Activity   Alcohol use: Not Currently   Drug use: Not Currently   Sexual activity: Not Currently  Other Topics Concern   Not on file  Social History Narrative   Not on file   Social Determinants of Health   Financial Resource Strain: Not on file  Food Insecurity: Not on file  Transportation Needs: Not on file  Physical Activity: Not on file  Stress: Not on file  Social Connections: Not on file   Additional Social History:  Specify valuables  returned:  (none)                      Sleep: Fair  Appetite:  Fair  Current Medications: Current Facility-Administered Medications  Medication Dose Route Frequency Provider Last Rate Last Admin   acetaminophen (TYLENOL) tablet 650 mg  650 mg Oral Q6H PRN Davita Sublett T, MD   650 mg at 06/20/22 1351   alum & mag hydroxide-simeth (MAALOX/MYLANTA) 200-200-20 MG/5ML suspension 30 mL  30 mL Oral Q4H PRN Uchechi Denison T, MD   30 mL at 04/26/22 2202   aspirin EC tablet 81 mg  81 mg Oral Daily Kailie Polus T, MD   81 mg at 08/01/22 5427   atorvastatin (LIPITOR) tablet 20 mg  20 mg Oral Daily Danajah Birdsell T, MD   20 mg at 08/01/22 0842   atropine 1 % ophthalmic solution 1 drop  1 drop Sublingual QID [name], MD   1 drop at 08/01/22 1150   benztropine (COGENTIN) tablet 0.5 mg  0.5 mg Oral BID Caera Enwright T, MD   0.5 mg at 08/01/22 0623   clonazePAM (KLONOPIN) tablet 1 mg  1 mg Oral TID PRN He, [name], MD   1 mg at 07/31/22 1608   cloZAPine (CLOZARIL) tablet 300 mg  300 mg Oral QHS Jullia Mulligan T, MD   300 mg at 07/31/22 2132   diphenhydrAMINE (BENADRYL) capsule 50 mg  50 mg Oral Q6H PRN Lionel Woodberry, [name], MD  50 mg at 07/31/22 1234   Or   diphenhydrAMINE (BENADRYL) injection 50 mg  50 mg Intramuscular Q6H PRN Mahlia Fernando T, MD       feeding supplement (ENSURE ENLIVE / ENSURE PLUS) liquid 237 mL  237 mL Oral TID BM Sanuel Ladnier T, MD   237 mL at 08/01/22 1325   gabapentin (NEURONTIN) capsule 300 mg  300 mg Oral TID Yicel Shannon T, MD   300 mg at 08/01/22 1150   haloperidol (HALDOL) tablet 5 mg  5 mg Oral TID Blessed Girdner T, MD   5 mg at 08/01/22 1150   haloperidol (HALDOL) tablet 5 mg  5 mg Oral Q6H PRN Lakoda Mcanany T, MD   5 mg at 07/29/22 2102   Or   haloperidol lactate (HALDOL) injection 5 mg  5 mg Intramuscular Q6H PRN Maryum Batterson T, MD       lisinopril (ZESTRIL) tablet 5 mg  5 mg Oral Daily Zi Newbury T, MD   5 mg at 08/01/22 2482   magnesium hydroxide (MILK OF  MAGNESIA) suspension 30 mL  30 mL Oral Daily PRN Andrika Peraza T, MD   30 mL at 05/02/22 5003   metoprolol succinate (TOPROL-XL) 24 hr tablet 25 mg  25 mg Oral Daily Mayo Owczarzak T, MD   25 mg at 08/01/22 0842   paliperidone (INVEGA SUSTENNA) injection 156 mg  156 mg Intramuscular Q28 days Dierks Wach T, MD   156 mg at 07/19/22 1029   temazepam (RESTORIL) capsule 15 mg  15 mg Oral QHS Donnette Macmullen T, MD   15 mg at 07/31/22 2132   ziprasidone (GEODON) injection 20 mg  20 mg Intramuscular Q12H PRN Garyn Arlotta, [name], MD   20 mg at 07/08/22 1453    Lab Results: No results found for this or any previous visit (from the past 48 hour(s)).  Blood Alcohol level:  Lab Results  Component Value Date   ETH <10 07/20/2021    Metabolic Disorder Labs: Lab Results  Component Value Date   HGBA1C 5.5 04/10/2022   MPG 111.15 04/10/2022   No results found for: "PROLACTIN" Lab Results  Component Value Date   CHOL 167 11/20/2021   TRIG 107 11/20/2021   HDL 26 (L) 11/20/2021   CHOLHDL 6.4 11/20/2021   VLDL 21 11/20/2021   LDLCALC 120 (H) 11/20/2021    Physical Findings: AIMS: Facial and Oral Movements Muscles of Facial Expression: None, normal Lips and Perioral Area: None, normal Jaw: None, normal Tongue: None, normal,Extremity Movements Upper (arms, wrists, hands, fingers): None, normal Lower (legs, knees, ankles, toes): None, normal, Trunk Movements Neck, shoulders, hips: None, normal, Overall Severity Severity of abnormal movements (highest score from questions above): None, normal Incapacitation due to abnormal movements: None, normal Patient's awareness of abnormal movements (rate only patient's report): No Awareness, Dental Status Current problems with teeth and/or dentures?: No Does patient usually wear dentures?: No  CIWA:    COWS:     Musculoskeletal: Strength & Muscle Tone: within normal limits Gait & Station: normal Patient leans: N/A  Psychiatric Specialty  Exam:  Presentation  General Appearance: Appropriate for Environment; Casual; Neat; Well Groomed  Eye Contact:Fair  Speech:Slow  Speech Volume:Decreased  Handedness:Left   Mood and Affect  Mood:Anxious  Affect:Congruent   Thought Process  Thought Processes:Goal Directed  Descriptions of Associations:Circumstantial  Orientation:Partial  Thought Content:Scattered  History of Schizophrenia/Schizoaffective disorder:Yes  Duration of Psychotic Symptoms:Greater than six months  Hallucinations:No data recorded Ideas of Reference:None  Suicidal  Thoughts:No data recorded Homicidal Thoughts:No data recorded  Sensorium  Memory:Remote Good  Judgment:Fair  Insight:Fair   Executive Functions  Concentration:Fair  Attention Span:Fair  [place]   Psychomotor Activity  Psychomotor Activity:No data recorded  Assets  Assets:No data recorded  Sleep  Sleep:No data recorded   Physical Exam: Physical Exam Vitals and nursing [name] reviewed.  Constitutional:      Appearance: Normal appearance.  HENT:     Head: Normocephalic and atraumatic.     Mouth/Throat:     Pharynx: Oropharynx is clear.  Eyes:     Pupils: Pupils are equal, round, and reactive to light.  Cardiovascular:     Rate and Rhythm: Normal rate and regular rhythm.  Pulmonary:     Effort: Pulmonary effort is normal.     Breath sounds: Normal breath sounds.  Abdominal:     General: Abdomen is flat.     Palpations: Abdomen is soft.  Musculoskeletal:        General: Normal range of motion.  Skin:    General: Skin is warm and dry.  Neurological:     General: No focal deficit present.     Mental Status: He is alert. Mental status is at baseline.  Psychiatric:        Mood and Affect: Mood normal.        Thought Content: Thought content normal.    Review of Systems  Unable to perform ROS: Language   Blood pressure 126/83, pulse 100, temperature 98  F (36.7 C), temperature source Oral, resp. rate 18, height 5\' 8"  (1.727 m), weight 85.8 kg, SpO2 100 %. Body mass index is 28.76 kg/m.   Treatment Plan Summary: Plan completely stable no new medical problems.  Psychiatric problems all under good control.  No reason at all to continue in the hospital.  Awaiting placement  Alethia Berthold, MD 08/01/2022, 3:45 PM

## 2022-08-01 NOTE — Plan of Care (Signed)
  Problem: Education: Goal: Ability to state activities that reduce stress will improve Outcome: Progressing   Problem: Coping: Goal: Ability to identify and develop effective coping behavior will improve Outcome: Progressing   Problem: Self-Concept: Goal: Ability to identify factors that promote anxiety will improve Outcome: Progressing Goal: Level of anxiety will decrease Outcome: Progressing Goal: Ability to modify response to factors that promote anxiety will improve Outcome: Progressing   Problem: Education: Goal: Knowledge of General Education information will improve Description: Including pain rating scale, medication(s)/side effects and non-pharmacologic comfort measures Outcome: Progressing   Problem: Coping: Goal: Coping ability will improve Outcome: Progressing   Problem: Activity: Goal: Interest or engagement in leisure activities will improve Outcome: Progressing   Problem: Skin Integrity: Goal: Risk for impaired skin integrity will decrease Outcome: Progressing   Problem: Safety: Goal: Ability to remain free from injury will improve Outcome: Progressing

## 2022-08-02 NOTE — Plan of Care (Signed)
°  Problem: Group Participation °Goal: STG - Patient will engage in groups with a calm and appropriate mood at least 2x within 5 recreation therapy group sessions °Description: STG - Patient will engage in groups with a calm and appropriate mood at least 2x within 5 recreation therapy group sessions °Outcome: Not Progressing °  °

## 2022-08-02 NOTE — Progress Notes (Signed)
Recreation Therapy Notes  Date: 08/02/2022   Time: 10:50 am    Location: Craft room      Behavioral response: N/A   Intervention Topic: Decision Making     Discussion/Intervention: Patient refused to attend group.    Clinical Observations/Feedback:  Patient refused to attend group.    Khiem Gargis LRT/CTRS         Adianna Darwin 08/02/2022 11:25 AM

## 2022-08-02 NOTE — Progress Notes (Signed)
Patient demanding with wanting cellphone this shift. Coming to nurses station multiple times with cell phone requesting to watch charmed. Patient with loud outburst at times. Patient anxious at beginning of shift.  Encouragement and support provided. Safety checks maintained. Medications given as prescribed. Pt receptive and remains safe on unit with q 15 min checks.

## 2022-08-02 NOTE — Progress Notes (Signed)
Pt remains safe on the unit. Largely non verbal but is smiling and cooperative with meals and meds and is able to make his needs known. Pt has special privileges due to his TBI and long term status on the unit. Pt phone placed on charger on the nurses station. Pt is safe, refused to complete self inventory. Will con't to monitor.

## 2022-08-02 NOTE — Plan of Care (Signed)
  Problem: Safety: Goal: Ability to remain free from injury will improve Outcome: Progressing   

## 2022-08-02 NOTE — Progress Notes (Signed)
[facility] MD Progress Note  08/02/2022 12:48 PM Adam [name]  MRN:  [id number] Subjective: No complaints no indication of any change in condition.  Because another patient was seeing greater need of the back hallway the patient has been moved out to a regular room on the milieu and is tolerating this well. 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 No pertinent surgical history. Family History: History reviewed. No pertinent family history. Family Psychiatric  History: See previous Social History:  Social History   Substance and Sexual Activity  Alcohol Use Not Currently     Social History   Substance and Sexual Activity  Drug Use Not Currently    Social History   Socioeconomic History   Marital status: Single    Spouse name: Not on file   Number of children: Not on file   Years of education: Not on file   Highest education level: Not on file  Occupational History   Not on file  Tobacco Use   Smoking status: Never    Passive exposure: Never   Smokeless tobacco: Never  Vaping Use   Vaping Use: Unknown  Substance and Sexual Activity   Alcohol use: Not Currently   Drug use: Not Currently   Sexual activity: Not Currently  Other Topics Concern   Not on file  Social History Narrative   Not on file   Social Determinants of Health   Financial Resource Strain: Not on file   Food Insecurity: Not on file  Transportation Needs: Not on file  Physical Activity: Not on file  Stress: Not on file  Social Connections: Not on file   Additional Social History:  Specify valuables returned:  (none)                      Sleep: Fair  Appetite:  Fair  Current Medications: Current Facility-Administered Medications  Medication Dose Route Frequency Provider Last Rate Last Admin   acetaminophen (TYLENOL) tablet 650 mg  650 mg Oral Q6H PRN Elana Jian T, MD   650 mg at 06/20/22 1351   alum & mag hydroxide-simeth (MAALOX/MYLANTA) 200-200-20 MG/5ML suspension 30 mL  30 mL Oral Q4H PRN Vanden Fawaz T, MD   30 mL at 04/26/22 1443   aspirin EC tablet 81 mg  81 mg Oral Daily Deforrest Bogle, [name], MD   81 mg at 08/02/22 1540   atorvastatin (LIPITOR) tablet 20 mg  20 mg Oral Daily Deano Tomaszewski T, MD   20 mg at 08/02/22 0917   atropine 1 % ophthalmic solution 1 drop  1 drop Sublingual QID [name], MD   1 drop at 08/02/22 1151   benztropine (COGENTIN) tablet 0.5 mg  0.5 mg Oral BID Aleece Loyd T, MD   0.5 mg at 08/02/22 0917   clonazePAM (KLONOPIN) tablet 1 mg  1 mg Oral TID PRN He, [name], MD   1 mg at 07/31/22 1608   cloZAPine (  CLOZARIL) tablet 300 mg  300 mg Oral QHS Emmaleigh Longo T, MD   300 mg at 08/01/22 2126   diphenhydrAMINE (BENADRYL) capsule 50 mg  50 mg Oral Q6H PRN Lendon George T, MD   50 mg at 07/31/22 1234   Or   diphenhydrAMINE (BENADRYL) injection 50 mg  50 mg Intramuscular Q6H PRN Earl Losee T, MD       feeding supplement (ENSURE ENLIVE / ENSURE PLUS) liquid 237 mL  237 mL Oral TID BM Bodi Palmeri T, MD   237 mL at 08/02/22 0919   gabapentin (NEURONTIN) capsule 300 mg  300 mg Oral TID Virgil Lightner T, MD   300 mg at 08/02/22 1148   haloperidol (HALDOL) tablet 5 mg  5 mg Oral TID Lissett Favorite T, MD   5 mg at 08/02/22 1148   haloperidol (HALDOL) tablet 5 mg  5 mg Oral Q6H PRN Remie Mathison T, MD   5 mg at 07/29/22 2102   Or   haloperidol lactate  (HALDOL) injection 5 mg  5 mg Intramuscular Q6H PRN Sonji Starkes T, MD       lisinopril (ZESTRIL) tablet 5 mg  5 mg Oral Daily Chellsea Beckers T, MD   5 mg at 08/02/22 4034   magnesium hydroxide (MILK OF MAGNESIA) suspension 30 mL  30 mL Oral Daily PRN Brieanna Nau T, MD   30 mL at 05/02/22 7425   metoprolol succinate (TOPROL-XL) 24 hr tablet 25 mg  25 mg Oral Daily Woods Gangemi T, MD   25 mg at 08/02/22 9563   paliperidone (INVEGA SUSTENNA) injection 156 mg  156 mg Intramuscular Q28 days Amika Tassin T, MD   156 mg at 07/19/22 1029   temazepam (RESTORIL) capsule 15 mg  15 mg Oral QHS Adonia Porada T, MD   15 mg at 08/01/22 2126   ziprasidone (GEODON) injection 20 mg  20 mg Intramuscular Q12H PRN Damiean Lukes, [name], MD   20 mg at 07/08/22 1453    Lab Results: No results found for this or any previous visit (from the past 48 hour(s)).  Blood Alcohol level:  Lab Results  Component Value Date   ETH <10 07/20/2021    Metabolic Disorder Labs: Lab Results  Component Value Date   HGBA1C 5.5 04/10/2022   MPG 111.15 04/10/2022   No results found for: "PROLACTIN" Lab Results  Component Value Date   CHOL 167 11/20/2021   TRIG 107 11/20/2021   HDL 26 (L) 11/20/2021   CHOLHDL 6.4 11/20/2021   VLDL 21 11/20/2021   LDLCALC 120 (H) 11/20/2021    Physical Findings: AIMS: Facial and Oral Movements Muscles of Facial Expression: None, normal Lips and Perioral Area: None, normal Jaw: None, normal Tongue: None, normal,Extremity Movements Upper (arms, wrists, hands, fingers): None, normal Lower (legs, knees, ankles, toes): None, normal, Trunk Movements Neck, shoulders, hips: None, normal, Overall Severity Severity of abnormal movements (highest score from questions above): None, normal Incapacitation due to abnormal movements: None, normal Patient's awareness of abnormal movements (rate only patient's report): No Awareness, Dental Status Current problems with teeth and/or dentures?: No Does  patient usually wear dentures?: No  CIWA:    COWS:     Musculoskeletal: Strength & Muscle Tone: within normal limits Gait & Station: normal Patient leans: N/A  Psychiatric Specialty Exam:  Presentation  General Appearance: Appropriate for Environment; Casual; Neat; Well Groomed  Eye Contact:Fair  Speech:Slow  Speech Volume:Decreased  Handedness:Left   Mood and Affect  Mood:Anxious  Affect:Congruent  Thought Process  Thought Processes:Goal Directed  Descriptions of Associations:Circumstantial  Orientation:Partial  Thought Content:Scattered  History of Schizophrenia/Schizoaffective disorder:Yes  Duration of Psychotic Symptoms:Greater than six months  Hallucinations:No data recorded Ideas of Reference:None  Suicidal Thoughts:No data recorded Homicidal Thoughts:No data recorded  Sensorium  Memory:Remote Good  Judgment:Fair  Insight:Fair   Executive Functions  Concentration:Fair  Attention Span:Fair  Recall:Fair  Fund of Knowledge:Fair  Language:Fair   Psychomotor Activity  Psychomotor Activity:No data recorded  Assets  Assets:No data recorded  Sleep  Sleep:No data recorded   Physical Exam: Physical Exam Vitals reviewed.  Constitutional:      Appearance: Normal appearance.  HENT:     Head: Normocephalic and atraumatic.     Mouth/Throat:     Pharynx: Oropharynx is clear.  Eyes:     Pupils: Pupils are equal, round, and reactive to light.  Cardiovascular:     Rate and Rhythm: Normal rate and regular rhythm.  Pulmonary:     Effort: Pulmonary effort is normal.     Breath sounds: Normal breath sounds.  Abdominal:     General: Abdomen is flat.     Palpations: Abdomen is soft.  Musculoskeletal:        General: Normal range of motion.  Skin:    General: Skin is warm and dry.  Neurological:     General: No focal deficit present.     Mental Status: He is alert. Mental status is at baseline.  Psychiatric:        Mood and Affect:  Mood normal.        Thought Content: Thought content normal.    Review of Systems  Constitutional: Negative.   HENT: Negative.    Eyes: Negative.   Respiratory: Negative.    Cardiovascular: Negative.   Gastrointestinal: Negative.   Musculoskeletal: Negative.   Skin: Negative.   Neurological: Negative.   Psychiatric/Behavioral: Negative.     Blood pressure 114/86, pulse (!) 104, temperature 98.8 F (37.1 C), temperature source Oral, resp. rate 16, height 5\' 8"  (1.727 m), weight 85.8 kg, SpO2 100 %. Body mass index is 28.76 kg/m.   Treatment Plan Summary: Medication management and Plan no change to medication.  Supportive counseling and encouragement.  Primarily waiting for discharge planning  , MD 08/02/2022, 12:48 PM

## 2022-08-03 NOTE — Progress Notes (Signed)
Patient continues to be demanding, labile. Pleasant most of shift with some episodes of agitation. Medication compliant. Appropriate with staff and peers. Denies SI, Hi, AVH.  Support provided. Meds given as prescribed. Pt remains safe on unit with q 15 min checks.

## 2022-08-03 NOTE — [facility] Group Notes (Signed)
[facility] Group Notes:  (Nursing/MHT/Case Management/Adjunct)  Date:  08/03/2022  Time:  9:45 AM  Type of Therapy:   community meeting  Participation Level:  Did Not Attend   Rodena Goldmann 08/03/2022, 9:45 AM

## 2022-08-03 NOTE — [facility] Group Notes (Signed)
[facility] Group Notes:  (Nursing/MHT/Case Management/Adjunct)  Date:  08/03/2022  Time:  8:55 PM  Type of Therapy:   Wrap up  Participation Level:  None  Participation Quality:   Walk out of group before beginning.  Summary of Progress/Problems:  Adam [name] 08/03/2022, 8:55 PM

## 2022-08-03 NOTE — Plan of Care (Signed)
  Problem: Coping: Goal: Ability to identify and develop effective coping behavior will improve Outcome: Progressing   Problem: Coping: Goal: Level of anxiety will decrease Outcome: Progressing   Problem: Safety: Goal: Ability to remain free from injury will improve Outcome: Progressing

## 2022-08-03 NOTE — Progress Notes (Signed)
[facility] MD Progress Note  08/03/2022 10:29 AM Adam [name]  MRN:  [id number] Subjective: No new complaint.  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 No pertinent surgical history. Family History: History reviewed. No pertinent family history. Family Psychiatric  History: None Social History:  Social History   Substance and Sexual Activity  Alcohol Use Not Currently     Social History   Substance and Sexual Activity  Drug Use Not Currently    Social History   Socioeconomic History   Marital status: Single    Spouse name: Not on file   Number of children: Not on file   Years of education: Not on file   Highest education level: Not on file  Occupational History   Not on file  Tobacco Use   Smoking status: Never    Passive exposure: Never   Smokeless tobacco: Never  Vaping Use   Vaping Use: Unknown  Substance and Sexual Activity   Alcohol use: Not Currently   Drug use: Not Currently   Sexual activity: Not Currently  Other Topics Concern   Not on file  Social History Narrative   Not on file   Social Determinants of Health   Financial Resource Strain: Not on file  Food Insecurity: Not on file  Transportation Needs: Not on file  Physical Activity: Not on file  Stress: Not on file  Social Connections: Not on file   Additional Social History:   Specify valuables returned:  (none)                      Sleep: Fair  Appetite:  Fair  Current Medications: Current Facility-Administered Medications  Medication Dose Route Frequency Provider Last Rate Last Admin   acetaminophen (TYLENOL) tablet 650 mg  650 mg Oral Q6H PRN Elmin Wiederholt T, MD   650 mg at 06/20/22 1351   alum & mag hydroxide-simeth (MAALOX/MYLANTA) 200-200-20 MG/5ML suspension 30 mL  30 mL Oral Q4H PRN Seema Blum T, MD   30 mL at 04/26/22 7371   aspirin EC tablet 81 mg  81 mg Oral Daily Con Arganbright, [name], MD   81 mg at 08/03/22 0859   atorvastatin (LIPITOR) tablet 20 mg  20 mg Oral Daily Ester Mabe T, MD   20 mg at 08/03/22 0859   atropine 1 % ophthalmic solution 1 drop  1 drop Sublingual QID [name], MD   1 drop at 08/03/22 0900   benztropine (COGENTIN) tablet 0.5 mg  0.5 mg Oral BID Gurman Ashland T, MD   0.5 mg at 08/03/22 0859   clonazePAM (KLONOPIN) tablet 1 mg  1 mg Oral TID PRN He, [name], MD   1 mg at 07/31/22 1608   cloZAPine (CLOZARIL) tablet 300 mg  300 mg Oral QHS Koda Defrank T, MD   300 mg at 08/02/22 2117   diphenhydrAMINE (BENADRYL) capsule 50 mg  50 mg Oral Q6H  PRN Zaneta Lightcap, [name], MD   50 mg at 07/31/22 1234   Or   diphenhydrAMINE (BENADRYL) injection 50 mg  50 mg Intramuscular Q6H PRN Laurieann Friddle T, MD       feeding supplement (ENSURE ENLIVE / ENSURE PLUS) liquid 237 mL  237 mL Oral TID BM Daveigh Batty T, MD   237 mL at 08/03/22 0949   gabapentin (NEURONTIN) capsule 300 mg  300 mg Oral TID Markelle Najarian, [name], MD   300 mg at 08/03/22 0859   haloperidol (HALDOL) tablet 5 mg  5 mg Oral TID Saramarie Stinger, [name], MD   5 mg at 08/03/22 0859   haloperidol (HALDOL) tablet 5 mg  5 mg Oral Q6H PRN Jasiah Buntin T, MD   5 mg at 07/29/22 2102   Or   haloperidol lactate (HALDOL) injection 5 mg  5 mg Intramuscular Q6H PRN Keili Hasten T, MD       lisinopril (ZESTRIL) tablet 5 mg  5 mg Oral Daily Aurther Harlin T, MD   5 mg at 08/03/22 0859   magnesium  hydroxide (MILK OF MAGNESIA) suspension 30 mL  30 mL Oral Daily PRN Verdell Dykman T, MD   30 mL at 05/02/22 7902   metoprolol succinate (TOPROL-XL) 24 hr tablet 25 mg  25 mg Oral Daily Destinae Neubecker T, MD   25 mg at 08/03/22 0859   paliperidone (INVEGA SUSTENNA) injection 156 mg  156 mg Intramuscular Q28 days Kazue Cerro T, MD   156 mg at 07/19/22 1029   temazepam (RESTORIL) capsule 15 mg  15 mg Oral QHS Shaye Elling T, MD   15 mg at 08/02/22 2117   ziprasidone (GEODON) injection 20 mg  20 mg Intramuscular Q12H PRN Ellieana Dolecki, [name], MD   20 mg at 07/08/22 1453    Lab Results: No results found for this or any previous visit (from the past 48 hour(s)).  Blood Alcohol level:  Lab Results  Component Value Date   ETH <10 07/20/2021    Metabolic Disorder Labs: Lab Results  Component Value Date   HGBA1C 5.5 04/10/2022   MPG 111.15 04/10/2022   No results found for: "PROLACTIN" Lab Results  Component Value Date   CHOL 167 11/20/2021   TRIG 107 11/20/2021   HDL 26 (L) 11/20/2021   CHOLHDL 6.4 11/20/2021   VLDL 21 11/20/2021   LDLCALC 120 (H) 11/20/2021    Physical Findings: AIMS: Facial and Oral Movements Muscles of Facial Expression: None, normal Lips and Perioral Area: None, normal Jaw: None, normal Tongue: None, normal,Extremity Movements Upper (arms, wrists, hands, fingers): None, normal Lower (legs, knees, ankles, toes): None, normal, Trunk Movements Neck, shoulders, hips: None, normal, Overall Severity Severity of abnormal movements (highest score from questions above): None, normal Incapacitation due to abnormal movements: None, normal Patient's awareness of abnormal movements (rate only patient's report): No Awareness, Dental Status Current problems with teeth and/or dentures?: No Does patient usually wear dentures?: No  CIWA:    COWS:     Musculoskeletal: Strength & Muscle Tone: within normal limits Gait & Station: normal Patient leans: N/A  Psychiatric  Specialty Exam:  Presentation  General Appearance: Appropriate for Environment; Casual; Neat; Well Groomed  Eye Contact:Fair  Speech:Slow  Speech Volume:Decreased  Handedness:Left   Mood and Affect  Mood:Anxious  Affect:Congruent   Thought Process  Thought Processes:Goal Directed  Descriptions of Associations:Circumstantial  Orientation:Partial  Thought Content:Scattered  History of Schizophrenia/Schizoaffective disorder:Yes  Duration of Psychotic Symptoms:Greater than six months  Hallucinations:No  data recorded Ideas of Reference:None  Suicidal Thoughts:No data recorded Homicidal Thoughts:No data recorded  Sensorium  Memory:Remote Good  Judgment:Fair  Insight:Fair   Executive Functions  Concentration:Fair  Attention Span:Fair  Recall:Fair  Fund of Knowledge:Fair  Language:Fair   Psychomotor Activity  Psychomotor Activity:No data recorded  Assets  Assets:No data recorded  Sleep  Sleep:No data recorded   Physical Exam: Physical Exam Vitals and nursing note reviewed.  Constitutional:      Appearance: Normal appearance.  HENT:     Head: Normocephalic and atraumatic.     Mouth/Throat:     Pharynx: Oropharynx is clear.  Eyes:     Pupils: Pupils are equal, round, and reactive to light.  Cardiovascular:     Rate and Rhythm: Normal rate and regular rhythm.  Pulmonary:     Effort: Pulmonary effort is normal.     Breath sounds: Normal breath sounds.  Abdominal:     General: Abdomen is flat.     Palpations: Abdomen is soft.  Musculoskeletal:        General: Normal range of motion.  Skin:    General: Skin is warm and dry.  Neurological:     General: No focal deficit present.     Mental Status: He is alert. Mental status is at baseline.  Psychiatric:        Mood and Affect: Mood normal.        Thought Content: Thought content normal.    Review of Systems  Constitutional: Negative.   HENT: Negative.    Eyes: Negative.    Respiratory: Negative.    Cardiovascular: Negative.   Gastrointestinal: Negative.   Musculoskeletal: Negative.   Skin: Negative.   Neurological: Negative.   Psychiatric/Behavioral: Negative.     Blood pressure 108/79, pulse 89, temperature 98.5 F (36.9 C), temperature source Oral, resp. rate 16, height 5\' 8"  (1.727 m), weight 85.8 kg, SpO2 100 %. Body mass index is 28.76 kg/m.   Treatment Plan Summary: Medication management and Plan patient is completely stable.  No behavior problems.  When other patients are getting agitated he will get a little bit louder but has not been inciting anything aggressive or inappropriate.  Continues to only need discharge planning  , MD 08/03/2022, 10:29 AM

## 2022-08-03 NOTE — Plan of Care (Signed)
Patient with loud outburst at times. Comes to nurses station multiple times to change channels in youtube. Patient not verbalized any distress. Compliant with medications. Appetite and energy level good. Support and encouragement given.

## 2022-08-04 NOTE — Plan of Care (Signed)
D- Patient alert and oriented to person and situation.  Patient presents in a pleasant mood on assessment. 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 Patient answers questions with a yes or no and sometimes he repeats what he hears other say. Patient stated that he slept good last night and has no complaints to voice to this Clinical research associate. Patient denies any signs/symptoms of depression and anxiety. Patient also denies SI, HI, AVH, and pain. Patient had no stated goals for today. Patient was slightly fixated on his armband this morning. He wanted another armband put on him, and then after about an hour or so, he wanted it taken off.  A- Scheduled medications administered to patient, per MD orders. Support and encouragement provided.  Routine safety checks conducted every 15 minutes.  Patient informed to notify staff with problems or concerns.  R- No adverse drug reactions noted. Patient contracts for safety at this time. Patient compliant with medications. Patient receptive, calm, and cooperative. Patient remains safe at this time.  Problem: Education: Goal: Ability to state activities that reduce stress will improve Outcome: Progressing   Problem: Coping: Goal: Ability to identify and develop effective coping behavior will improve Outcome: Progressing   Problem: Self-Concept: Goal: Ability to identify factors that promote anxiety will improve Outcome: Progressing Goal: Level of anxiety will decrease Outcome: Progressing Goal: Ability to modify response to factors that promote anxiety will improve Outcome: Progressing   Problem: Education: Goal: Knowledge of General Education information will improve Description: Including pain rating scale, medication(s)/side effects and non-pharmacologic comfort measures Outcome: Progressing   Problem: Health Behavior/Discharge Planning: Goal: Ability to manage health-related needs will improve Outcome: Progressing    Problem: Clinical Measurements: Goal: Ability to maintain clinical measurements within normal limits will improve Outcome: Progressing Goal: Will remain free from infection Outcome: Progressing Goal: Diagnostic test results will improve Outcome: Progressing Goal: Respiratory complications will improve Outcome: Progressing Goal: Cardiovascular complication will be avoided Outcome: Progressing   Problem: Activity: Goal: Risk for activity intolerance will decrease Outcome: Progressing   Problem: Nutrition: Goal: Adequate nutrition will be maintained Outcome: Progressing   Problem: Coping: Goal: Level of anxiety will decrease Outcome: Progressing   Problem: Elimination: Goal: Will not experience complications related to bowel motility Outcome: Progressing Goal: Will not experience complications related to urinary retention Outcome: Progressing   Problem: Pain Managment: Goal: General experience of comfort will improve Outcome: Progressing   Problem: Safety: Goal: Ability to remain free from injury will improve Outcome: Progressing   Problem: Skin Integrity: Goal: Risk for impaired skin integrity will decrease Outcome: Progressing   Problem: Activity: Goal: Interest or engagement in leisure activities will improve Outcome: Progressing   Problem: Coping: Goal: Coping ability will improve Outcome: Progressing

## 2022-08-04 NOTE — Progress Notes (Signed)
Patient alert and oriented x 2 with confusion to time and situation , he appears less anxious and less anxious. Patient denies SI/HI/AVH, 15 minutes safety checks maintained will continue to monitor closely.

## 2022-08-04 NOTE — Progress Notes (Signed)
[facility] MD Progress Note  08/04/2022 2:28 PM Adam [name]  MRN:  [id number] Subjective: Adam [name] is seen on rounds.  No changes with him.  He is in good controls.  No side effects from his medications.  No issues.  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 No pertinent surgical history. Family History: History reviewed. No pertinent family history.  Social History:  Social History   Substance and Sexual Activity  Alcohol Use Not Currently     Social History   Substance and Sexual Activity  Drug Use Not Currently    Social History   Socioeconomic History   Marital status: Single    Spouse name: Not on file   Number of children: Not on file   Years of education: Not on file   Highest education level: Not on file  Occupational History   Not on file  Tobacco Use   Smoking status: Never    Passive exposure: Never   Smokeless tobacco: Never  Vaping Use   Vaping Use: Unknown  Substance and Sexual Activity   Alcohol use: Not Currently   Drug use: Not Currently   Sexual activity: Not Currently  Other Topics Concern   Not on file  Social History Narrative   Not on file   Social Determinants of Health   Financial Resource Strain: Not on file  Food Insecurity: Not on file  Transportation Needs: Not on file  Physical Activity: Not on file  Stress: Not on file  Social Connections: Not  on file   Additional Social History:  Specify valuables returned:  (none)                      Sleep: Good  Appetite:  Good  Current Medications: Current Facility-Administered Medications  Medication Dose Route Frequency Provider Last Rate Last Admin   acetaminophen (TYLENOL) tablet 650 mg  650 mg Oral Q6H PRN [name], [name], MD   650 mg at 06/20/22 1351   alum & mag hydroxide-simeth (MAALOX/MYLANTA) 200-200-20 MG/5ML suspension 30 mL  30 mL Oral Q4H PRN [name], [name], MD   30 mL at 04/26/22 0998   aspirin EC tablet 81 mg  81 mg Oral Daily [name], [name], MD   81 mg at 08/04/22 0817   atorvastatin (LIPITOR) tablet 20 mg  20 mg Oral Daily [name], [name], MD   20 mg at 08/04/22 0817   atropine 1 % ophthalmic solution 1 drop  1 drop Sublingual QID [name], MD   1 drop at 08/04/22 1234   benztropine (COGENTIN) tablet 0.5 mg  0.5 mg Oral BID [name], [name], MD   0.5 mg at 08/04/22 0817   clonazePAM (KLONOPIN) tablet 1 mg  1 mg Oral TID PRN He, [name], MD   1 mg at 07/31/22 1608   cloZAPine (CLOZARIL) tablet 300 mg  300 mg Oral QHS [name], [name], MD   300 mg at  08/03/22 2115   diphenhydrAMINE (BENADRYL) capsule 50 mg  50 mg Oral Q6H PRN [name], [name], MD   50 mg at 08/03/22 1148   Or   diphenhydrAMINE (BENADRYL) injection 50 mg  50 mg Intramuscular Q6H PRN [name], [name], MD       feeding supplement (ENSURE ENLIVE / ENSURE PLUS) liquid 237 mL  237 mL Oral TID BM [name], [name], MD   237 mL at 08/04/22 1413   gabapentin (NEURONTIN) capsule 300 mg  300 mg Oral TID [name], [name], MD   300 mg at 08/04/22 1233   haloperidol (HALDOL) tablet 5 mg  5 mg Oral TID [name], [name], MD   5 mg at 08/04/22 1234   haloperidol (HALDOL) tablet 5 mg  5 mg Oral Q6H PRN [name], [name], MD   5 mg at 07/29/22 2102   Or   haloperidol lactate (HALDOL) injection 5 mg  5 mg Intramuscular Q6H PRN [name], [name], MD       lisinopril (ZESTRIL) tablet 5 mg  5 mg Oral Daily [name], [name], MD    5 mg at 08/04/22 0817   magnesium hydroxide (MILK OF MAGNESIA) suspension 30 mL  30 mL Oral Daily PRN [name], [name], MD   30 mL at 05/02/22 3664   metoprolol succinate (TOPROL-XL) 24 hr tablet 25 mg  25 mg Oral Daily [name], [name], MD   25 mg at 08/04/22 0817   paliperidone (INVEGA SUSTENNA) injection 156 mg  156 mg Intramuscular Q28 days [name], [name], MD   156 mg at 07/19/22 1029   temazepam (RESTORIL) capsule 15 mg  15 mg Oral QHS [name], [name], MD   15 mg at 08/03/22 2112   ziprasidone (GEODON) injection 20 mg  20 mg Intramuscular Q12H PRN [name], [name], MD   20 mg at 07/08/22 1453    Lab Results: No results found for this or any previous visit (from the past 48 hour(s)).  Blood Alcohol level:  Lab Results  Component Value Date   ETH <10 07/20/2021    Metabolic Disorder Labs: Lab Results  Component Value Date   HGBA1C 5.5 04/10/2022   MPG 111.15 04/10/2022   No results found for: "PROLACTIN" Lab Results  Component Value Date   CHOL 167 11/20/2021   TRIG 107 11/20/2021   HDL 26 (L) 11/20/2021   CHOLHDL 6.4 11/20/2021   VLDL 21 11/20/2021   LDLCALC 120 (H) 11/20/2021    Physical Findings: AIMS: Facial and Oral Movements Muscles of Facial Expression: None, normal Lips and Perioral Area: None, normal Jaw: None, normal Tongue: None, normal,Extremity Movements Upper (arms, wrists, hands, fingers): None, normal Lower (legs, knees, ankles, toes): None, normal, Trunk Movements Neck, shoulders, hips: None, normal, Overall Severity Severity of abnormal movements (highest score from questions above): None, normal Incapacitation due to abnormal movements: None, normal Patient's awareness of abnormal movements (rate only patient's report): No Awareness, Dental Status Current problems with teeth and/or dentures?: No Does patient usually wear dentures?: No  CIWA:    COWS:     Musculoskeletal: Strength & Muscle Tone: within normal limits Gait & Station:  normal Patient leans: N/A  Psychiatric Specialty Exam:  Presentation  General Appearance: Appropriate for Environment; Casual; Neat; Well Groomed  Eye Contact:Fair  Speech:Slow  Speech Volume:Decreased  Handedness:Left   Mood and Affect  Mood:Anxious  Affect:Congruent   Thought Process  Thought Processes:Goal Directed  Descriptions of Associations:Circumstantial  Orientation:Partial  Thought Content:Scattered  History of Schizophrenia/Schizoaffective disorder:Yes  Duration of Psychotic Symptoms:Greater than six months  Hallucinations:No data recorded Ideas of Reference:None  Suicidal Thoughts:No data recorded Homicidal Thoughts:No data recorded  Sensorium  Memory:Remote Good  Judgment:Fair  Insight:Fair   Executive Functions  Concentration:Fair  Attention Span:Fair  Recall:Fair  Fund of Knowledge:Fair  Language:Fair   Psychomotor Activity  Psychomotor Activity:No data recorded  Assets  Assets:No data recorded  Sleep  Sleep:No data recorded    Blood pressure 113/81, pulse 95, temperature 98.4 F (36.9 C), temperature source Oral, resp. rate 20, height 5\' 8"  (1.727 m), weight 85.8 kg, SpO2 100 %. Body mass index is 28.76 kg/m.   Treatment Plan Summary: Daily contact with patient to assess and evaluate symptoms and progress in treatment, Medication management, and Plan continue current medications.  , DO 08/04/2022, 2:28 PM

## 2022-08-05 NOTE — Plan of Care (Signed)
D- Patient alert and oriented to person and situation. Patient presented in a preoccupied, but pleasant mood on assessment. Patient didn't eat breakfast this morning. 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 Patient answers questions with a "yes" or "no". When this writer asked if he slept good last night, he stated "yeah". Patient denied SI, HI, AVH, and pain at this time. Patient also denied any signs/symptoms of depression/anxiety. Patient was fixated on his armband earlier. Patient then came back to the nurses station asking to take it off, saying "Emberson", and wanting an "a" put at the end of his name plate on his door. This is what patient says when he feels as if he's a woman. Patient then came back to the nurses station pointing at this writer to follow him to his door. Patient pointed to his name plate and said "Tawni Carnes", wanting this Clinical research associate to write her name on the door, then he said "heart", asking for the heart to be placed at the end. Patient smiled at this Clinical research associate and said "yeah". Patient had no stated goals for today.  A- Scheduled medications administered to patient, per MD orders. Support and encouragement provided.  Routine safety checks conducted every 15 minutes.  Patient informed to notify staff with problems or concerns.  R- No adverse drug reactions noted. Patient contracts for safety at this time. Patient compliant with medications. Patient receptive, calm, and cooperative. Patient remains safe at this time.  Problem: Education: Goal: Ability to state activities that reduce stress will improve Outcome: Progressing   Problem: Coping: Goal: Ability to identify and develop effective coping behavior will improve Outcome: Progressing   Problem: Self-Concept: Goal: Ability to identify factors that promote anxiety will improve Outcome: Progressing Goal: Level of anxiety will decrease Outcome: Progressing Goal: Ability to modify response to factors  that promote anxiety will improve Outcome: Progressing   Problem: Education: Goal: Knowledge of General Education information will improve Description: Including pain rating scale, medication(s)/side effects and non-pharmacologic comfort measures Outcome: Progressing   Problem: Health Behavior/Discharge Planning: Goal: Ability to manage health-related needs will improve Outcome: Progressing   Problem: Clinical Measurements: Goal: Ability to maintain clinical measurements within normal limits will improve Outcome: Progressing Goal: Will remain free from infection Outcome: Progressing Goal: Diagnostic test results will improve Outcome: Progressing Goal: Respiratory complications will improve Outcome: Progressing Goal: Cardiovascular complication will be avoided Outcome: Progressing   Problem: Activity: Goal: Risk for activity intolerance will decrease Outcome: Progressing   Problem: Nutrition: Goal: Adequate nutrition will be maintained Outcome: Progressing   Problem: Coping: Goal: Level of anxiety will decrease Outcome: Progressing   Problem: Elimination: Goal: Will not experience complications related to bowel motility Outcome: Progressing Goal: Will not experience complications related to urinary retention Outcome: Progressing   Problem: Pain Managment: Goal: General experience of comfort will improve Outcome: Progressing   Problem: Safety: Goal: Ability to remain free from injury will improve Outcome: Progressing   Problem: Skin Integrity: Goal: Risk for impaired skin integrity will decrease Outcome: Progressing   Problem: Activity: Goal: Interest or engagement in leisure activities will improve Outcome: Progressing   Problem: Coping: Goal: Coping ability will improve Outcome: Progressing

## 2022-08-05 NOTE — Progress Notes (Signed)
[facility] MD Progress Note  08/05/2022 4:39 PM Adam [name]  MRN:  [id number] Subjective: Adam [name] was seen on rounds.  He has been in good controls recently.  Compliant with medications.  No complaints and no issues.  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 No pertinent surgical history. Family History: History reviewed. No pertinent family history.  Social History:  Social History   Substance and Sexual Activity  Alcohol Use Not Currently     Social History   Substance and Sexual Activity  Drug Use Not Currently    Social History   Socioeconomic History   Marital status: Single    Spouse name: Not on file   Number of children: Not on file   Years of education: Not on file   Highest education level: Not on file  Occupational History   Not on file  Tobacco Use   Smoking status: Never    Passive exposure: Never   Smokeless tobacco: Never  Vaping Use   Vaping Use: Unknown  Substance and Sexual Activity   Alcohol use: Not Currently   Drug use: Not Currently   Sexual activity: Not Currently  Other Topics Concern   Not on file  Social History Narrative   Not on file   Social Determinants of Health   Financial Resource Strain: Not on file  Food Insecurity: Not on file  Transportation Needs: Not on file  Physical Activity: Not on file  Stress: Not on file  Social Connections: Not on file    Additional Social History:  Specify valuables returned:  (none)                      Sleep: Good  Appetite:  Good  Current Medications: Current Facility-Administered Medications  Medication Dose Route Frequency Provider Last Rate Last Admin   acetaminophen (TYLENOL) tablet 650 mg  650 mg Oral Q6H PRN [name], [name], MD   650 mg at 06/20/22 1351   alum & mag hydroxide-simeth (MAALOX/MYLANTA) 200-200-20 MG/5ML suspension 30 mL  30 mL Oral Q4H PRN [name], [name], MD   30 mL at 04/26/22 3295   aspirin EC tablet 81 mg  81 mg Oral Daily [name], [name], MD   81 mg at 08/05/22 0834   atorvastatin (LIPITOR) tablet 20 mg  20 mg Oral Daily [name], [name], MD   20 mg at 08/05/22 0834   atropine 1 % ophthalmic solution 1 drop  1 drop Sublingual QID [name], MD   1 drop at 08/05/22 1212   benztropine (COGENTIN) tablet 0.5 mg  0.5 mg Oral BID [name], [name], MD   0.5 mg at 08/05/22 0834   clonazePAM (KLONOPIN) tablet 1 mg  1 mg Oral TID PRN He, [name], MD   1 mg at 07/31/22 1608   cloZAPine (CLOZARIL) tablet 300 mg  300 mg Oral QHS [name], [name], MD   300 mg at 08/04/22 2130   diphenhydrAMINE (BENADRYL) capsule  50 mg  50 mg Oral Q6H PRN [name], [name], MD   50 mg at 08/03/22 1148   Or   diphenhydrAMINE (BENADRYL) injection 50 mg  50 mg Intramuscular Q6H PRN [name], [name], MD       feeding supplement (ENSURE ENLIVE / ENSURE PLUS) liquid 237 mL  237 mL Oral TID BM [name], [name], MD   237 mL at 08/05/22 1408   gabapentin (NEURONTIN) capsule 300 mg  300 mg Oral TID [name], [name], MD   300 mg at 08/05/22 1212   haloperidol (HALDOL) tablet 5 mg  5 mg Oral TID [name], [name], MD   5 mg at 08/05/22 1212   haloperidol (HALDOL) tablet 5 mg  5 mg Oral Q6H PRN [name], [name], MD   5 mg at 07/29/22 2102   Or   haloperidol lactate (HALDOL) injection 5 mg  5 mg Intramuscular Q6H PRN [name], [name], MD       lisinopril (ZESTRIL) tablet 5 mg  5 mg Oral Daily [name], [name], MD   5 mg at  08/05/22 0834   magnesium hydroxide (MILK OF MAGNESIA) suspension 30 mL  30 mL Oral Daily PRN [name], [name], MD   30 mL at 05/02/22 4403   metoprolol succinate (TOPROL-XL) 24 hr tablet 25 mg  25 mg Oral Daily [name], [name], MD   25 mg at 08/05/22 0834   paliperidone (INVEGA SUSTENNA) injection 156 mg  156 mg Intramuscular Q28 days [name], [name], MD   156 mg at 07/19/22 1029   temazepam (RESTORIL) capsule 15 mg  15 mg Oral QHS [name], [name], MD   15 mg at 08/04/22 2130   ziprasidone (GEODON) injection 20 mg  20 mg Intramuscular Q12H PRN [name], [name], MD   20 mg at 07/08/22 1453    Lab Results: No results found for this or any previous visit (from the past 48 hour(s)).  Blood Alcohol level:  Lab Results  Component Value Date   ETH <10 07/20/2021    Metabolic Disorder Labs: Lab Results  Component Value Date   HGBA1C 5.5 04/10/2022   MPG 111.15 04/10/2022   No results found for: "PROLACTIN" Lab Results  Component Value Date   CHOL 167 11/20/2021   TRIG 107 11/20/2021   HDL 26 (L) 11/20/2021   CHOLHDL 6.4 11/20/2021   VLDL 21 11/20/2021   LDLCALC 120 (H) 11/20/2021    Physical Findings: AIMS: Facial and Oral Movements Muscles of Facial Expression: None, normal Lips and Perioral Area: None, normal Jaw: None, normal Tongue: None, normal,Extremity Movements Upper (arms, wrists, hands, fingers): None, normal Lower (legs, knees, ankles, toes): None, normal, Trunk Movements Neck, shoulders, hips: None, normal, Overall Severity Severity of abnormal movements (highest score from questions above): None, normal Incapacitation due to abnormal movements: None, normal Patient's awareness of abnormal movements (rate only patient's report): No Awareness, Dental Status Current problems with teeth and/or dentures?: No Does patient usually wear dentures?: No  CIWA:    COWS:     Musculoskeletal: Strength & Muscle Tone: within normal limits Gait & Station: normal Patient leans:  N/A  Psychiatric Specialty Exam:  Presentation  General Appearance: Appropriate for Environment; Casual; Neat; Well Groomed  Eye Contact:Fair  Speech:Slow  Speech Volume:Decreased  Handedness:Left   Mood and Affect  Mood:Anxious  Affect:Congruent   Thought Process  Thought Processes:Goal Directed  Descriptions of Associations:Circumstantial  Orientation:Partial  Thought Content:Scattered  History of Schizophrenia/Schizoaffective disorder:Yes  Duration of  Psychotic Symptoms:Greater than six months  Hallucinations:No data recorded Ideas of Reference:None  Suicidal Thoughts:No data recorded Homicidal Thoughts:No data recorded  Sensorium  Memory:Remote Good  Judgment:Fair  Insight:Fair   Executive Functions  Concentration:Fair  Attention Span:Fair  Recall:Fair  Fund of Knowledge:Fair  Language:Fair   Psychomotor Activity  Psychomotor Activity:No data recorded  Assets  Assets:No data recorded  Sleep  Sleep:No data recorded    Blood pressure 125/81, pulse (!) 103, temperature 98.7 F (37.1 C), temperature source Oral, resp. rate 20, height 5\' 8"  (1.727 m), weight 85.8 kg, SpO2 97 %. Body mass index is 28.76 kg/m.   Treatment Plan Summary: Daily contact with patient to assess and evaluate symptoms and progress in treatment, Medication management, and Plan continue current medications.  , DO 08/05/2022, 4:39 PM

## 2022-08-05 NOTE — [facility] Group Notes (Signed)
[facility] Group Notes:  (Nursing/MHT/Case Management/Adjunct)  Date:  08/05/2022  Time:  10:17 AM  Type of Therapy:   community meeting  Participation Level:  Did Not Attend    Rodena Goldmann 08/05/2022, 10:17 AM

## 2022-08-06 NOTE — Progress Notes (Signed)
Patient alert and oriented x 2 with confusion to time and situation , he appears less anxious and less irritable, no distress noted, he denies SI/HI/AVH, 15 minutes safety checks maintained will continue to monitor closely.

## 2022-08-06 NOTE — Progress Notes (Signed)
[facility] MD Progress Note  08/06/2022 11:20 AM Adam [name]  MRN:  [id number] Subjective: Patient seen.  No change in presentation.  Anxious but not violent or threatening.  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 No pertinent surgical history. Family History: History reviewed. No pertinent family history. Family Psychiatric  History: See previous Social History:  Social History   Substance and Sexual Activity  Alcohol Use Not Currently     Social History   Substance and Sexual Activity  Drug Use Not Currently    Social History   Socioeconomic History   Marital status: Single    Spouse name: Not on file   Number of children: Not on file   Years of education: Not on file   Highest education level: Not on file  Occupational History   Not on file  Tobacco Use   Smoking status: Never    Passive exposure: Never   Smokeless tobacco: Never  Vaping Use   Vaping Use: Unknown  Substance and Sexual Activity   Alcohol use: Not Currently   Drug use: Not Currently   Sexual activity: Not Currently  Other Topics Concern   Not on file  Social History Narrative   Not on file   Social Determinants of Health   Financial Resource Strain: Not on file  Food Insecurity: Not on file  Transportation Needs: Not on file  Physical Activity: Not on file  Stress:  Not on file  Social Connections: Not on file   Additional Social History:  Specify valuables returned:  (none)                      Sleep: Fair  Appetite:  Fair  Current Medications: Current Facility-Administered Medications  Medication Dose Route Frequency Provider Last Rate Last Admin   acetaminophen (TYLENOL) tablet 650 mg  650 mg Oral Q6H PRN Breylin Dom T, MD   650 mg at 06/20/22 1351   alum & mag hydroxide-simeth (MAALOX/MYLANTA) 200-200-20 MG/5ML suspension 30 mL  30 mL Oral Q4H PRN Doria Fern T, MD   30 mL at 04/26/22 7425   aspirin EC tablet 81 mg  81 mg Oral Daily Pearlee Arvizu, [name], MD   81 mg at 08/06/22 0814   atorvastatin (LIPITOR) tablet 20 mg  20 mg Oral Daily Anush Wiedeman T, MD   20 mg at 08/06/22 0814   atropine 1 % ophthalmic solution 1 drop  1 drop Sublingual QID [name], MD   1 drop at 08/06/22 0815   benztropine (COGENTIN) tablet 0.5 mg  0.5 mg Oral BID Mithcell Schumpert T, MD   0.5 mg at 08/06/22 0813   clonazePAM (KLONOPIN) tablet 1 mg  1 mg Oral TID PRN He, [name], MD   1 mg at 07/31/22 1608   cloZAPine (CLOZARIL) tablet 300 mg  300 mg Oral QHS Jaycen Vercher T, MD   300 mg at 08/05/22 2110  diphenhydrAMINE (BENADRYL) capsule 50 mg  50 mg Oral Q6H PRN Loui Massenburg T, MD   50 mg at 08/03/22 1148   Or   diphenhydrAMINE (BENADRYL) injection 50 mg  50 mg Intramuscular Q6H PRN Ashten Prats T, MD       feeding supplement (ENSURE ENLIVE / ENSURE PLUS) liquid 237 mL  237 mL Oral TID BM Samay Delcarlo T, MD   237 mL at 08/06/22 0944   gabapentin (NEURONTIN) capsule 300 mg  300 mg Oral TID Keyundra Fant, [name], MD   300 mg at 08/06/22 6734   haloperidol (HALDOL) tablet 5 mg  5 mg Oral TID Sheddrick Lattanzio, [name], MD   5 mg at 08/06/22 1937   haloperidol (HALDOL) tablet 5 mg  5 mg Oral Q6H PRN Avram Danielson T, MD   5 mg at 07/29/22 2102   Or   haloperidol lactate (HALDOL) injection 5 mg  5 mg Intramuscular Q6H PRN Icel Castles T, MD       lisinopril (ZESTRIL) tablet 5 mg   5 mg Oral Daily Thaddeus Evitts T, MD   5 mg at 08/06/22 0814   magnesium hydroxide (MILK OF MAGNESIA) suspension 30 mL  30 mL Oral Daily PRN Yasha Tibbett T, MD   30 mL at 05/02/22 9024   metoprolol succinate (TOPROL-XL) 24 hr tablet 25 mg  25 mg Oral Daily Mehek Grega T, MD   25 mg at 08/06/22 0814   paliperidone (INVEGA SUSTENNA) injection 156 mg  156 mg Intramuscular Q28 days Reizel Calzada T, MD   156 mg at 07/19/22 1029   temazepam (RESTORIL) capsule 15 mg  15 mg Oral QHS Vitor Overbaugh T, MD   15 mg at 08/05/22 2110   ziprasidone (GEODON) injection 20 mg  20 mg Intramuscular Q12H PRN Laneya Gasaway, [name], MD   20 mg at 07/08/22 1453    Lab Results: No results found for this or any previous visit (from the past 48 hour(s)).  Blood Alcohol level:  Lab Results  Component Value Date   ETH <10 07/20/2021    Metabolic Disorder Labs: Lab Results  Component Value Date   HGBA1C 5.5 04/10/2022   MPG 111.15 04/10/2022   No results found for: "PROLACTIN" Lab Results  Component Value Date   CHOL 167 11/20/2021   TRIG 107 11/20/2021   HDL 26 (L) 11/20/2021   CHOLHDL 6.4 11/20/2021   VLDL 21 11/20/2021   LDLCALC 120 (H) 11/20/2021    Physical Findings: AIMS: Facial and Oral Movements Muscles of Facial Expression: None, normal Lips and Perioral Area: None, normal Jaw: None, normal Tongue: None, normal,Extremity Movements Upper (arms, wrists, hands, fingers): None, normal Lower (legs, knees, ankles, toes): None, normal, Trunk Movements Neck, shoulders, hips: None, normal, Overall Severity Severity of abnormal movements (highest score from questions above): None, normal Incapacitation due to abnormal movements: None, normal Patient's awareness of abnormal movements (rate only patient's report): No Awareness, Dental Status Current problems with teeth and/or dentures?: No Does patient usually wear dentures?: No  CIWA:    COWS:     Musculoskeletal: Strength & Muscle Tone: within normal  limits Gait & Station: normal Patient leans: N/A  Psychiatric Specialty Exam:  Presentation  General Appearance: Appropriate for Environment; Casual; Neat; Well Groomed  Eye Contact:Fair  Speech:Slow  Speech Volume:Decreased  Handedness:Left   Mood and Affect  Mood:Anxious  Affect:Congruent   Thought Process  Thought Processes:Goal Directed  Descriptions of Associations:Circumstantial  Orientation:Partial  Thought Content:Scattered  History of Schizophrenia/Schizoaffective disorder:Yes  Duration of Psychotic Symptoms:Greater than six months  Hallucinations:No data recorded Ideas of Reference:None  Suicidal Thoughts:No data recorded Homicidal Thoughts:No data recorded  Sensorium  Memory:Remote Good  Judgment:Fair  Insight:Fair   Executive Functions  Concentration:Fair  Attention Span:Fair  Recall:Fair  Fund of Knowledge:Fair  Language:Fair   Psychomotor Activity  Psychomotor Activity:No data recorded  Assets  Assets:No data recorded  Sleep  Sleep:No data recorded   Physical Exam: Physical Exam Vitals and nursing note reviewed.  Constitutional:      Appearance: Normal appearance.  HENT:     Head: Normocephalic and atraumatic.     Mouth/Throat:     Pharynx: Oropharynx is clear.  Eyes:     Pupils: Pupils are equal, round, and reactive to light.  Cardiovascular:     Rate and Rhythm: Normal rate and regular rhythm.  Pulmonary:     Effort: Pulmonary effort is normal.     Breath sounds: Normal breath sounds.  Abdominal:     General: Abdomen is flat.     Palpations: Abdomen is soft.  Musculoskeletal:        General: Normal range of motion.  Skin:    General: Skin is warm and dry.  Neurological:     General: No focal deficit present.     Mental Status: He is alert. Mental status is at baseline.  Psychiatric:        Attention and Perception: He is inattentive.        Mood and Affect: Affect is blunt.        Speech: He is  noncommunicative.    Review of Systems  Unable to perform ROS: Language   Blood pressure 98/82, pulse (!) 107, temperature 97.9 F (36.6 C), temperature source Oral, resp. rate 20, height 5\' 8"  (1.727 m), weight 85.8 kg, SpO2 97 %. Body mass index is 28.76 kg/m.   Treatment Plan Summary: Medication management and Plan perfectly stable no longer in need of inpatient hospitalization.  Just waiting on discharge  , MD 08/06/2022, 11:20 AM

## 2022-08-06 NOTE — [facility] Group Notes (Signed)
[facility] Group Notes:  (Nursing/MHT/Case Management/Adjunct)  Date:  08/06/2022  Time:  9:50 PM  Type of Therapy:  Group Therapy  Participation Level:  Did Not Attend    [name],[name] 08/06/2022, 9:50 PM

## 2022-08-06 NOTE — Progress Notes (Signed)
Pt singing loudly on the unit. Appears in no acute distress. Erased name board and again asked Clinical research associate to place [place] on board.

## 2022-08-06 NOTE — Progress Notes (Signed)
Pt remains safe on the unit. Largely non verbal but is smiling and cooperative with meals and meds and is able to make his needs known. Pt has been yelling today after his ascom phone was removed from room (pt had both a cell phone and an ascom phone to dial out.) . He was taking the ascom into the dayroom escalating other patients so, this privelege has been revoked due to the need to maintain milieu and the escalation of other patients. Pt has been allowed by management special privileges due to his TBI and long term status on the unit. Pt repeatedly screaming when phone removed, and educated. Pt is safe, refused to complete self inventory. Denies pain. Requested staff to write Tawni Carnes on room door today. Will con't to monitor.

## 2022-08-07 LAB — CBC WITH DIFFERENTIAL/PLATELET
Abs Immature Granulocytes: 0.01 10*3/uL (ref 0.00–0.07)
Basophils Absolute: 0.1 10*3/uL (ref 0.0–0.1)
Basophils Relative: 1 %
Eosinophils Absolute: 0 10*3/uL (ref 0.0–0.5)
Eosinophils Relative: 1 %
HCT: 40.8 % (ref 39.0–52.0)
Hemoglobin: 14.1 g/dL (ref 13.0–17.0)
Immature Granulocytes: 0 %
Lymphocytes Relative: 29 %
Lymphs Abs: 1.2 10*3/uL (ref 0.7–4.0)
MCH: 30.1 pg (ref 26.0–34.0)
MCHC: 34.6 g/dL (ref 30.0–36.0)
MCV: 87 fL (ref 80.0–100.0)
Monocytes Absolute: 0.4 10*3/uL (ref 0.1–1.0)
Monocytes Relative: 10 %
Neutro Abs: 2.4 10*3/uL (ref 1.7–7.7)
Neutrophils Relative %: 59 %
Platelets: 165 10*3/uL (ref 150–400)
RBC: 4.69 MIL/uL (ref 4.22–5.81)
RDW: 13.9 % (ref 11.5–15.5)
WBC: 4.1 10*3/uL (ref 4.0–10.5)
nRBC: 0 % (ref 0.0–0.2)

## 2022-08-07 NOTE — Progress Notes (Signed)
Patient calm and pleasant during assessment denying SI/HI/AVH. Pt observed interacting appropriately with staff and peers on the unit. Pt compliant with medication administration per MD orders. Pt given education, support, and encouragement to be active in his treatment plan. Pt being monitored Q 15 minutes for safety per unit protocol, remains safe on the unit  

## 2022-08-07 NOTE — [facility] Group Notes (Signed)
[facility] Group Notes:  (Nursing/MHT/Case Management/Adjunct)  Date:  08/07/2022  Time:  9:01 PM  Type of Therapy:  Group Therapy Wrap up   Participation Level:  Active  Participation Quality:  Intrusive and Redirectable  Affect:  Excited and Not Congruent  Cognitive:  Disorganized  Insight:  Improving  Engagement in Group:  Distracting  Modes of Intervention:  Discussion  Summary of Progress/Problems:  Adam [name],Adam [name] 08/07/2022, 9:01 PM

## 2022-08-07 NOTE — Plan of Care (Signed)
Patient this morning observed in his room resting in bed, just prior to our engagement. Patient engagement this morning characterized by limited stress tolerance/ability to participate in assessments (though notably improved overall since time spent working with the patient), minimal verbal speech outside of abrupt and curt yes or no to questions asked and or garbled difficult to understand words, oddly and intently related interpersonal style, concrete to illogical thought process, and brief to avoided eye contact. Patient gave stated "no" to si/hi/avh, physical pain, and or problems with sleeping, eating, and or his medications.   Patient this morning complaint with medications and vital signs, but refused adamantly several times he would not allow laboratory studies to be drawn (shouting at several staff who tried to discuss this with him). Patient observed eating little breakfast, largely thus far has been preoccupied with trying to utilize the phone.   Q x 15 minute observation checks in place/maintained for safety. Patient is provided with education throughout shift when appropriate and able.  Patient is given/offered medications per orders. Patient is encouraged to attend groups, participate in unit activities and continue with plan of care. Pt. Chart and plans of care reviewed. Pt. Given support and encouragement when appropriate and able.      Problem: Safety: Goal: Ability to remain free from injury will improve Outcome: Progressing Note: Patient has been able to be kept safe on the secure environment of the unit.    Problem: Education: Goal: Will be free of psychotic symptoms Outcome: Not Progressing Note: Patient mental status largely non-evolving with this Clinical research associate.

## 2022-08-07 NOTE — Progress Notes (Signed)
Recreation Therapy Notes  Date: 08/07/2022   Time: 10:00 am    Location: Craft room      Behavioral response: N/A   Intervention Topic: Time Management    Discussion/Intervention: Patient refused to attend group.    Clinical Observations/Feedback:  Patient refused to attend group.    Jannetta Massey LRT/CTRS        Adam [name] 08/07/2022 11:13 AM 

## 2022-08-07 NOTE — Progress Notes (Signed)
[facility] MD Progress Note  08/07/2022 3:36 PM Adam [name]  MRN:  [id number] Subjective: No change in behavior no new complaint. 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 No pertinent surgical history. Family History: History reviewed. No pertinent family history. Family Psychiatric  History: None reported Social History:  Social History   Substance and Sexual Activity  Alcohol Use Not Currently     Social History   Substance and Sexual Activity  Drug Use Not Currently    Social History   Socioeconomic History   Marital status: Single    Spouse name: Not on file   Number of children: Not on file   Years of education: Not on file   Highest education level: Not on file  Occupational History   Not on file  Tobacco Use   Smoking status: Never    Passive exposure: Never   Smokeless tobacco: Never  Vaping Use   Vaping Use: Unknown  Substance and Sexual Activity   Alcohol use: Not Currently   Drug use: Not Currently   Sexual activity: Not Currently  Other Topics Concern   Not on file  Social History Narrative   Not on file   Social Determinants of Health   Financial Resource Strain: Not on file  Food Insecurity: Not on file  Transportation Needs: Not on file  Physical Activity: Not on file  Stress: Not on file  Social Connections: Not on file   Additional Social History:   Specify valuables returned:  (none)                      Sleep: Fair  Appetite:  Fair  Current Medications: Current Facility-Administered Medications  Medication Dose Route Frequency Provider Last Rate Last Admin   acetaminophen (TYLENOL) tablet 650 mg  650 mg Oral Q6H PRN Akon Reinoso T, MD   650 mg at 06/20/22 1351   alum & mag hydroxide-simeth (MAALOX/MYLANTA) 200-200-20 MG/5ML suspension 30 mL  30 mL Oral Q4H PRN Thurman Sarver T, MD   30 mL at 04/26/22 6270   aspirin EC tablet 81 mg  81 mg Oral Daily Itsel Opfer, [name], MD   81 mg at 08/07/22 0743   atorvastatin (LIPITOR) tablet 20 mg  20 mg Oral Daily Jacquel Redditt T, MD   20 mg at 08/07/22 0743   atropine 1 % ophthalmic solution 1 drop  1 drop Sublingual QID [name], MD   1 drop at 08/07/22 1228   benztropine (COGENTIN) tablet 0.5 mg  0.5 mg Oral BID Ilissa Rosner T, MD   0.5 mg at 08/07/22 0743   clonazePAM (KLONOPIN) tablet 1 mg  1 mg Oral TID PRN He, [name], MD   1 mg at 08/06/22 2119   cloZAPine (CLOZARIL) tablet 300 mg  300 mg Oral QHS Patrycja Mumpower T, MD   300 mg at 08/06/22 2119   diphenhydrAMINE (BENADRYL) capsule 50 mg  50 mg Oral Q6H PRN Simmone Cape,  [name], MD   50 mg at 08/06/22 2119   Or   diphenhydrAMINE (BENADRYL) injection 50 mg  50 mg Intramuscular Q6H PRN Niccole Witthuhn, [name], MD       feeding supplement (ENSURE ENLIVE / ENSURE PLUS) liquid 237 mL  237 mL Oral TID BM Franciszek Platten T, MD   237 mL at 08/07/22 1345   gabapentin (NEURONTIN) capsule 300 mg  300 mg Oral TID Anzlee Hinesley, [name], MD   300 mg at 08/07/22 1223   haloperidol (HALDOL) tablet 5 mg  5 mg Oral TID Ewen Varnell, [name], MD   5 mg at 08/07/22 1223   haloperidol (HALDOL) tablet 5 mg  5 mg Oral Q6H PRN Modena Bellemare, [name], MD   5 mg at 07/29/22 2102   Or   haloperidol lactate (HALDOL) injection 5 mg  5 mg Intramuscular Q6H PRN Alphons Burgert, [name], MD       lisinopril (ZESTRIL) tablet 5 mg  5 mg Oral Daily Stephanieann Popescu, [name], MD   5 mg at 08/07/22 0743   magnesium  hydroxide (MILK OF MAGNESIA) suspension 30 mL  30 mL Oral Daily PRN Willadeen Colantuono T, MD   30 mL at 05/02/22 5573   metoprolol succinate (TOPROL-XL) 24 hr tablet 25 mg  25 mg Oral Daily Daionna Crossland T, MD   25 mg at 08/07/22 0743   paliperidone (INVEGA SUSTENNA) injection 156 mg  156 mg Intramuscular Q28 days Karron Goens T, MD   156 mg at 07/19/22 1029   temazepam (RESTORIL) capsule 15 mg  15 mg Oral QHS Kishana Battey T, MD   15 mg at 08/06/22 2119   ziprasidone (GEODON) injection 20 mg  20 mg Intramuscular Q12H PRN Keta Van[place], [name], MD   20 mg at 07/08/22 1453    Lab Results:  Results for orders placed or performed during the hospital encounter of 03/19/22 (from the past 48 hour(s))  CBC with Differential/Platelet     Status: None   Collection Time: 08/07/22  2:43 PM  Result Value Ref Range   WBC 4.1 4.0 - 10.5 K/uL   RBC 4.69 4.22 - 5.81 MIL/uL   Hemoglobin 14.1 13.0 - 17.0 g/dL   HCT 22.0 25.4 - 27.0 %   MCV 87.0 80.0 - 100.0 fL   MCH 30.1 26.0 - 34.0 pg   MCHC 34.6 30.0 - 36.0 g/dL   RDW 62.3 76.2 - 83.1 %   Platelets 165 150 - 400 K/uL   nRBC 0.0 0.0 - 0.2 %   Neutrophils Relative % 59 %   Neutro Abs 2.4 1.7 - 7.7 K/uL   Lymphocytes Relative 29 %   Lymphs Abs 1.2 0.7 - 4.0 K/uL   Monocytes Relative 10 %   Monocytes Absolute 0.4 0.1 - 1.0 K/uL   Eosinophils Relative 1 %   Eosinophils Absolute 0.0 0.0 - 0.5 K/uL   Basophils Relative 1 %   Basophils Absolute 0.1 0.0 - 0.1 K/uL   Immature Granulocytes 0 %   Abs Immature Granulocytes 0.01 0.00 - 0.07 K/uL    Comment: Performed at [facility], [address] [place], [place] [address]    Blood Alcohol level:  Lab Results  Component Value Date   [facility] <10 07/20/2021    Metabolic Disorder Labs: Lab Results  Component Value Date   HGBA1C 5.5 04/10/2022   MPG 111.15 04/10/2022   No results found for: "PROLACTIN" Lab Results  Component Value Date   CHOL 167 11/20/2021  TRIG 107 11/20/2021   HDL 26 (L)  11/20/2021   CHOLHDL 6.4 11/20/2021   VLDL 21 11/20/2021   LDLCALC 120 (H) 11/20/2021    Physical Findings: AIMS: Facial and Oral Movements Muscles of Facial Expression: None, normal Lips and Perioral Area: None, normal Jaw: None, normal Tongue: None, normal,Extremity Movements Upper (arms, wrists, hands, fingers): None, normal Lower (legs, knees, ankles, toes): None, normal, Trunk Movements Neck, shoulders, hips: None, normal, Overall Severity Severity of abnormal movements (highest score from questions above): None, normal Incapacitation due to abnormal movements: None, normal Patient's awareness of abnormal movements (rate only patient's report): No Awareness, Dental Status Current problems with teeth and/or dentures?: No Does patient usually wear dentures?: No  CIWA:    COWS:     Musculoskeletal: Strength & Muscle Tone: within normal limits Gait & Station: normal Patient leans: N/A  Psychiatric Specialty Exam:  Presentation  General Appearance: Appropriate for Environment; Casual; Neat; Well Groomed  Eye Contact:Fair  Speech:Slow  Speech Volume:Decreased  Handedness:Left   Mood and Affect  Mood:Anxious  Affect:Congruent   Thought Process  Thought Processes:Goal Directed  Descriptions of Associations:Circumstantial  Orientation:Partial  Thought Content:Scattered  History of Schizophrenia/Schizoaffective disorder:Yes  Duration of Psychotic Symptoms:Greater than six months  Hallucinations:No data recorded Ideas of Reference:None  Suicidal Thoughts:No data recorded Homicidal Thoughts:No data recorded  Sensorium  Memory:Remote Good  Judgment:Fair  Insight:Fair   Executive Functions  Concentration:Fair  Attention Span:Fair  Recall:Fair  Fund of Knowledge:Fair  Language:Fair   Psychomotor Activity  Psychomotor Activity:No data recorded  Assets  Assets:No data recorded  Sleep  Sleep:No data recorded   Physical  Exam: Physical Exam Vitals and nursing note reviewed.  Constitutional:      Appearance: Normal appearance.  HENT:     Head: Normocephalic and atraumatic.     Mouth/Throat:     Pharynx: Oropharynx is clear.  Eyes:     Pupils: Pupils are equal, round, and reactive to light.  Cardiovascular:     Rate and Rhythm: Normal rate and regular rhythm.  Pulmonary:     Effort: Pulmonary effort is normal.     Breath sounds: Normal breath sounds.  Abdominal:     General: Abdomen is flat.     Palpations: Abdomen is soft.  Musculoskeletal:        General: Normal range of motion.  Skin:    General: Skin is warm and dry.  Neurological:     General: No focal deficit present.     Mental Status: He is alert. Mental status is at baseline.  Psychiatric:        Attention and Perception: He is inattentive.        Mood and Affect: Mood normal. Affect is labile.        Speech: He is noncommunicative.    Review of Systems  Unable to perform ROS: Language   Blood pressure 121/83, pulse 74, temperature 98.6 F (37 C), temperature source Oral, resp. rate 17, height 5\' 8"  (1.727 m), weight 85.8 kg, SpO2 99 %. Body mass index is 28.76 kg/m.   Treatment Plan Summary: Plan doing great on clozapine.  No new behavior problems.  Still waiting on placement  , MD 08/07/2022, 3:36 PM

## 2022-08-08 NOTE — Plan of Care (Signed)
Patient at the nurses station most of the shift with multiple needs. Needed several attempts to redirect patient. Compliant with medications. No distress verbalized. Appetite and energy level good. Support and encouragement given.

## 2022-08-08 NOTE — Progress Notes (Signed)
Recreation Therapy Notes    Date: 08/08/2022   Time: 10:20 am    Location: Court yard     Behavioral response: N/A   Intervention Topic: Decision Making    Discussion/Intervention: Patient refused to attend group.    Clinical Observations/Feedback:  Patient refused to attend group.    Gjon Letarte LRT/CTRS        Azaria Bartell 08/08/2022 10:43 AM

## 2022-08-08 NOTE — Progress Notes (Signed)
Patient calm and pleasant during assessment denying SI/HI/AVH. Pt observed interacting appropriately with staff and peers on the unit. Pt compliant with medication administration per MD orders. Pt given education, support, and encouragement to be active in his treatment plan. Pt being monitored Q 15 minutes for safety per unit protocol, remains safe on the unit  

## 2022-08-08 NOTE — Progress Notes (Signed)
[facility] MD Progress Note  08/08/2022 4:54 PM Adam [name]  MRN:  [id number] Subjective: No new complaint.  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 No pertinent surgical history. Family History: History reviewed. No pertinent family history. Family Psychiatric  History: See previous Social History:  Social History   Substance and Sexual Activity  Alcohol Use Not Currently     Social History   Substance and Sexual Activity  Drug Use Not Currently    Social History   Socioeconomic History   Marital status: Single    Spouse name: Not on file   Number of children: Not on file   Years of education: Not on file   Highest education level: Not on file  Occupational History   Not on file  Tobacco Use   Smoking status: Never    Passive exposure: Never   Smokeless tobacco: Never  Vaping Use   Vaping Use: Unknown  Substance and Sexual Activity   Alcohol use: Not Currently   Drug use: Not Currently   Sexual activity: Not Currently  Other Topics Concern   Not on file  Social History Narrative   Not on file   Social Determinants of Health   Financial Resource Strain: Not on file  Food Insecurity: Not on file  Transportation Needs: Not on file  Physical Activity: Not on file  Stress: Not on file  Social Connections: Not on file   Additional Social History:  Specify  valuables returned:  (none)                      Sleep: Fair  Appetite:  Fair  Current Medications: Current Facility-Administered Medications  Medication Dose Route Frequency Provider Last Rate Last Admin   acetaminophen (TYLENOL) tablet 650 mg  650 mg Oral Q6H PRN Akiyah Eppolito T, MD   650 mg at 06/20/22 1351   alum & mag hydroxide-simeth (MAALOX/MYLANTA) 200-200-20 MG/5ML suspension 30 mL  30 mL Oral Q4H PRN Tymeshia Awan T, MD   30 mL at 04/26/22 8657   aspirin EC tablet 81 mg  81 mg Oral Daily Bandy Honaker T, MD   81 mg at 08/08/22 0900   atorvastatin (LIPITOR) tablet 20 mg  20 mg Oral Daily Madline Oesterling T, MD   20 mg at 08/08/22 0900   atropine 1 % ophthalmic solution 1 drop  1 drop Sublingual QID [name], MD   1 drop at 08/08/22 1227   benztropine (COGENTIN) tablet 0.5 mg  0.5 mg Oral BID Jah Alarid T, MD   0.5 mg at 08/08/22 0900   clonazePAM (KLONOPIN) tablet 1 mg  1 mg Oral TID PRN He, [name], MD   1 mg at 08/06/22 2119   cloZAPine (CLOZARIL) tablet 300 mg  300 mg Oral QHS Mitsuye Schrodt T, MD   300 mg at 08/07/22 2110   diphenhydrAMINE (BENADRYL) capsule 50 mg  50 mg Oral Q6H PRN Shyne Lehrke,  [name], MD   50 mg at 08/06/22 2119   Or   diphenhydrAMINE (BENADRYL) injection 50 mg  50 mg Intramuscular Q6H PRN Borden Thune, [name], MD       feeding supplement (ENSURE ENLIVE / ENSURE PLUS) liquid 237 mL  237 mL Oral TID BM Hanh Kertesz T, MD   237 mL at 08/08/22 1456   gabapentin (NEURONTIN) capsule 300 mg  300 mg Oral TID Lunabella Badgett, [name], MD   300 mg at 08/08/22 1228   haloperidol (HALDOL) tablet 5 mg  5 mg Oral TID Bunnie Rehberg, [name], MD   5 mg at 08/08/22 1227   haloperidol (HALDOL) tablet 5 mg  5 mg Oral Q6H PRN Ailene Royal, [name], MD   5 mg at 07/29/22 2102   Or   haloperidol lactate (HALDOL) injection 5 mg  5 mg Intramuscular Q6H PRN Kemyah Buser, [name], MD       lisinopril (ZESTRIL) tablet 5 mg  5 mg Oral Daily Tinisha Etzkorn T, MD   5 mg at 08/08/22 0900   magnesium hydroxide  (MILK OF MAGNESIA) suspension 30 mL  30 mL Oral Daily PRN Diannia Hogenson T, MD   30 mL at 05/02/22 2440   metoprolol succinate (TOPROL-XL) 24 hr tablet 25 mg  25 mg Oral Daily Paulmichael Schreck T, MD   25 mg at 08/08/22 0900   paliperidone (INVEGA SUSTENNA) injection 156 mg  156 mg Intramuscular Q28 days Holston Oyama T, MD   156 mg at 07/19/22 1029   temazepam (RESTORIL) capsule 15 mg  15 mg Oral QHS Jalayiah Bibian T, MD   15 mg at 08/07/22 2110   ziprasidone (GEODON) injection 20 mg  20 mg Intramuscular Q12H PRN Laykin Rainone, [name], MD   20 mg at 07/08/22 1453    Lab Results:  Results for orders placed or performed during the hospital encounter of 03/19/22 (from the past 48 hour(s))  CBC with Differential/Platelet     Status: None   Collection Time: 08/07/22  2:43 PM  Result Value Ref Range   WBC 4.1 4.0 - 10.5 K/uL   RBC 4.69 4.22 - 5.81 MIL/uL   Hemoglobin 14.1 13.0 - 17.0 g/dL   HCT 10.2 72.5 - 36.6 %   MCV 87.0 80.0 - 100.0 fL   MCH 30.1 26.0 - 34.0 pg   MCHC 34.6 30.0 - 36.0 g/dL   RDW 44.0 34.7 - 42.5 %   Platelets 165 150 - 400 K/uL   nRBC 0.0 0.0 - 0.2 %   Neutrophils Relative % 59 %   Neutro Abs 2.4 1.7 - 7.7 K/uL   Lymphocytes Relative 29 %   Lymphs Abs 1.2 0.7 - 4.0 K/uL   Monocytes Relative 10 %   Monocytes Absolute 0.4 0.1 - 1.0 K/uL   Eosinophils Relative 1 %   Eosinophils Absolute 0.0 0.0 - 0.5 K/uL   Basophils Relative 1 %   Basophils Absolute 0.1 0.0 - 0.1 K/uL   Immature Granulocytes 0 %   Abs Immature Granulocytes 0.01 0.00 - 0.07 K/uL    Comment: Performed at [facility], [address] [place], [place] [address]    Blood Alcohol level:  Lab Results  Component Value Date   [facility] <10 07/20/2021    Metabolic Disorder Labs: Lab Results  Component Value Date   HGBA1C 5.5 04/10/2022   MPG 111.15 04/10/2022   No results found for: "PROLACTIN" Lab Results  Component Value Date   CHOL 167 11/20/2021  TRIG 107 11/20/2021   HDL 26 (L) 11/20/2021    CHOLHDL 6.4 11/20/2021   VLDL 21 11/20/2021   LDLCALC 120 (H) 11/20/2021    Physical Findings: AIMS: Facial and Oral Movements Muscles of Facial Expression: None, normal Lips and Perioral Area: None, normal Jaw: None, normal Tongue: None, normal,Extremity Movements Upper (arms, wrists, hands, fingers): None, normal Lower (legs, knees, ankles, toes): None, normal, Trunk Movements Neck, shoulders, hips: None, normal, Overall Severity Severity of abnormal movements (highest score from questions above): None, normal Incapacitation due to abnormal movements: None, normal Patient's awareness of abnormal movements (rate only patient's report): No Awareness, Dental Status Current problems with teeth and/or dentures?: No Does patient usually wear dentures?: No  CIWA:    COWS:     Musculoskeletal: Strength & Muscle Tone: within normal limits Gait & Station: normal Patient leans: N/A  Psychiatric Specialty Exam:  Presentation  General Appearance: Appropriate for Environment; Casual; Neat; Well Groomed  Eye Contact:Fair  Speech:Slow  Speech Volume:Decreased  Handedness:Left   Mood and Affect  Mood:Anxious  Affect:Congruent   Thought Process  Thought Processes:Goal Directed  Descriptions of Associations:Circumstantial  Orientation:Partial  Thought Content:Scattered  History of Schizophrenia/Schizoaffective disorder:Yes  Duration of Psychotic Symptoms:Greater than six months  Hallucinations:No data recorded Ideas of Reference:None  Suicidal Thoughts:No data recorded Homicidal Thoughts:No data recorded  Sensorium  Memory:Remote Good  Judgment:Fair  Insight:Fair   Executive Functions  Concentration:Fair  Attention Span:Fair  Recall:Fair  Fund of Knowledge:Fair  Language:Fair   Psychomotor Activity  Psychomotor Activity:No data recorded  Assets  Assets:No data recorded  Sleep  Sleep:No data recorded   Physical Exam: Physical Exam Vitals  and nursing note reviewed.  Constitutional:      Appearance: Normal appearance.  HENT:     Head: Normocephalic and atraumatic.     Mouth/Throat:     Pharynx: Oropharynx is clear.  Eyes:     Pupils: Pupils are equal, round, and reactive to light.  Cardiovascular:     Rate and Rhythm: Normal rate and regular rhythm.  Pulmonary:     Effort: Pulmonary effort is normal.     Breath sounds: Normal breath sounds.  Abdominal:     General: Abdomen is flat.     Palpations: Abdomen is soft.  Musculoskeletal:        General: Normal range of motion.  Skin:    General: Skin is warm and dry.  Neurological:     General: No focal deficit present.     Mental Status: He is alert. Mental status is at baseline.  Psychiatric:        Attention and Perception: He is inattentive.        Mood and Affect: Mood normal. Affect is blunt.        Speech: He is noncommunicative.    Review of Systems  Unable to perform ROS: Language   Blood pressure 123/78, pulse 99, temperature 98.3 F (36.8 C), temperature source Oral, resp. rate 17, height 5\' 8"  (1.727 m), weight 85.8 kg, SpO2 99 %. Body mass index is 28.76 kg/m.   Treatment Plan Summary: Plan no change to medication.  No change to overall treatment plan.  Waiting for placement  , MD 08/08/2022, 4:54 PM

## 2022-08-09 NOTE — Progress Notes (Signed)
[facility] MD Progress Note  08/09/2022 12:56 PM Adam [name]  MRN:  [id number] Subjective: Patient seen.  No change in behavior.  No evidence of new complaints.  Pleasant interactions with others.  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 No pertinent surgical history. Family History: History reviewed. No pertinent family history. Family Psychiatric  History: See previous Social History:  Social History   Substance and Sexual Activity  Alcohol Use Not Currently     Social History   Substance and Sexual Activity  Drug Use Not Currently    Social History   Socioeconomic History   Marital status: Single    Spouse name: Not on file   Number of children: Not on file   Years of education: Not on file   Highest education level: Not on file  Occupational History   Not on file  Tobacco Use   Smoking status: Never    Passive exposure: Never   Smokeless tobacco: Never  Vaping Use   Vaping Use: Unknown  Substance and Sexual Activity   Alcohol use: Not Currently   Drug use: Not Currently   Sexual activity: Not Currently  Other Topics Concern   Not on file  Social History Narrative   Not on file   Social Determinants of Health   Financial Resource Strain: Not on file  Food Insecurity: Not on file  Transportation Needs: Not on file  Physical Activity: Not on file   Stress: Not on file  Social Connections: Not on file   Additional Social History:  Specify valuables returned:  (none)                      Sleep: Fair  Appetite:  Fair  Current Medications: Current Facility-Administered Medications  Medication Dose Route Frequency Provider Last Rate Last Admin   acetaminophen (TYLENOL) tablet 650 mg  650 mg Oral Q6H PRN Yalena Colon T, MD   650 mg at 06/20/22 1351   alum & mag hydroxide-simeth (MAALOX/MYLANTA) 200-200-20 MG/5ML suspension 30 mL  30 mL Oral Q4H PRN Brystal Kildow T, MD   30 mL at 04/26/22 5188   aspirin EC tablet 81 mg  81 mg Oral Daily Harrold Fitchett, [name], MD   81 mg at 08/09/22 0929   atorvastatin (LIPITOR) tablet 20 mg  20 mg Oral Daily Teofila Bowery T, MD   20 mg at 08/09/22 0929   atropine 1 % ophthalmic solution 1 drop  1 drop Sublingual QID [name], MD   1 drop at 08/09/22 0937   benztropine (COGENTIN) tablet 0.5 mg  0.5 mg Oral BID Kally Cadden T, MD   0.5 mg at 08/09/22 4166   clonazePAM (KLONOPIN) tablet 1 mg  1 mg Oral TID PRN He, [name], MD   1 mg at 08/06/22 2119   cloZAPine (CLOZARIL) tablet 300 mg  300 mg Oral QHS Kamren Heskett T, MD   300 mg at 08/08/22  2122   diphenhydrAMINE (BENADRYL) capsule 50 mg  50 mg Oral Q6H PRN Cherylann Hobday, [name], MD   50 mg at 08/06/22 2119   Or   diphenhydrAMINE (BENADRYL) injection 50 mg  50 mg Intramuscular Q6H PRN Maxwell Lemen, [name], MD       feeding supplement (ENSURE ENLIVE / ENSURE PLUS) liquid 237 mL  237 mL Oral TID BM Latica Hohmann T, MD   237 mL at 08/09/22 0939   gabapentin (NEURONTIN) capsule 300 mg  300 mg Oral TID Ladawn Boullion, [name], MD   300 mg at 08/09/22 5053   haloperidol (HALDOL) tablet 5 mg  5 mg Oral TID Rupinder Livingston, [name], MD   5 mg at 08/09/22 9767   haloperidol (HALDOL) tablet 5 mg  5 mg Oral Q6H PRN Arvel Oquinn, [name], MD   5 mg at 07/29/22 2102   Or   haloperidol lactate (HALDOL) injection 5 mg  5 mg Intramuscular Q6H PRN Rayma Hegg, [name], MD       lisinopril (ZESTRIL)  tablet 5 mg  5 mg Oral Daily Nazaire Cordial, [name], MD   5 mg at 08/09/22 0936   magnesium hydroxide (MILK OF MAGNESIA) suspension 30 mL  30 mL Oral Daily PRN Yazan Gatling T, MD   30 mL at 05/02/22 3419   metoprolol succinate (TOPROL-XL) 24 hr tablet 25 mg  25 mg Oral Daily Dhanush Jokerst T, MD   25 mg at 08/09/22 0936   paliperidone (INVEGA SUSTENNA) injection 156 mg  156 mg Intramuscular Q28 days Arlo Buffone T, MD   156 mg at 07/19/22 1029   temazepam (RESTORIL) capsule 15 mg  15 mg Oral QHS Anaisabel Pederson T, MD   15 mg at 08/08/22 2122   ziprasidone (GEODON) injection 20 mg  20 mg Intramuscular Q12H PRN Yi Falletta, [name], MD   20 mg at 07/08/22 1453    Lab Results:  Results for orders placed or performed during the hospital encounter of 03/19/22 (from the past 48 hour(s))  CBC with Differential/Platelet     Status: None   Collection Time: 08/07/22  2:43 PM  Result Value Ref Range   WBC 4.1 4.0 - 10.5 K/uL   RBC 4.69 4.22 - 5.81 MIL/uL   Hemoglobin 14.1 13.0 - 17.0 g/dL   HCT 37.9 02.4 - 09.7 %   MCV 87.0 80.0 - 100.0 fL   MCH 30.1 26.0 - 34.0 pg   MCHC 34.6 30.0 - 36.0 g/dL   RDW 35.3 29.9 - 24.2 %   Platelets 165 150 - 400 K/uL   nRBC 0.0 0.0 - 0.2 %   Neutrophils Relative % 59 %   Neutro Abs 2.4 1.7 - 7.7 K/uL   Lymphocytes Relative 29 %   Lymphs Abs 1.2 0.7 - 4.0 K/uL   Monocytes Relative 10 %   Monocytes Absolute 0.4 0.1 - 1.0 K/uL   Eosinophils Relative 1 %   Eosinophils Absolute 0.0 0.0 - 0.5 K/uL   Basophils Relative 1 %   Basophils Absolute 0.1 0.0 - 0.1 K/uL   Immature Granulocytes 0 %   Abs Immature Granulocytes 0.01 0.00 - 0.07 K/uL    Comment: Performed at [facility], [address] [place], [place] [address]    Blood Alcohol level:  Lab Results  Component Value Date   [facility] <10 07/20/2021    Metabolic Disorder Labs: Lab Results  Component Value Date   HGBA1C 5.5 04/10/2022   MPG 111.15 04/10/2022   No results  found for: "PROLACTIN" Lab Results   Component Value Date   CHOL 167 11/20/2021   TRIG 107 11/20/2021   HDL 26 (L) 11/20/2021   CHOLHDL 6.4 11/20/2021   VLDL 21 11/20/2021   LDLCALC 120 (H) 11/20/2021    Physical Findings: AIMS: Facial and Oral Movements Muscles of Facial Expression: None, normal Lips and Perioral Area: None, normal Jaw: None, normal Tongue: None, normal,Extremity Movements Upper (arms, wrists, hands, fingers): None, normal Lower (legs, knees, ankles, toes): None, normal, Trunk Movements Neck, shoulders, hips: None, normal, Overall Severity Severity of abnormal movements (highest score from questions above): None, normal Incapacitation due to abnormal movements: None, normal Patient's awareness of abnormal movements (rate only patient's report): No Awareness, Dental Status Current problems with teeth and/or dentures?: No Does patient usually wear dentures?: No  CIWA:    COWS:     Musculoskeletal: Strength & Muscle Tone: within normal limits Gait & Station: normal Patient leans: N/A  Psychiatric Specialty Exam:  Presentation  General Appearance: Appropriate for Environment; Casual; Neat; Well Groomed  Eye Contact:Fair  Speech:Slow  Speech Volume:Decreased  Handedness:Left   Mood and Affect  Mood:Anxious  Affect:Congruent   Thought Process  Thought Processes:Goal Directed  Descriptions of Associations:Circumstantial  Orientation:Partial  Thought Content:Scattered  History of Schizophrenia/Schizoaffective disorder:Yes  Duration of Psychotic Symptoms:Greater than six months  Hallucinations:No data recorded Ideas of Reference:None  Suicidal Thoughts:No data recorded Homicidal Thoughts:No data recorded  Sensorium  Memory:Remote Good  Judgment:Fair  Insight:Fair   Executive Functions  Concentration:Fair  Attention Span:Fair  Recall:Fair  Fund of Knowledge:Fair  Language:Fair   Psychomotor Activity  Psychomotor Activity:No data recorded  Assets   Assets:No data recorded  Sleep  Sleep:No data recorded   Physical Exam: Physical Exam Vitals reviewed.  Constitutional:      Appearance: Normal appearance.  HENT:     Head: Normocephalic and atraumatic.     Mouth/Throat:     Pharynx: Oropharynx is clear.  Eyes:     Pupils: Pupils are equal, round, and reactive to light.  Cardiovascular:     Rate and Rhythm: Normal rate and regular rhythm.  Pulmonary:     Effort: Pulmonary effort is normal.     Breath sounds: Normal breath sounds.  Abdominal:     General: Abdomen is flat.     Palpations: Abdomen is soft.  Musculoskeletal:        General: Normal range of motion.  Skin:    General: Skin is warm and dry.  Neurological:     General: No focal deficit present.     Mental Status: He is alert. Mental status is at baseline.  Psychiatric:        Attention and Perception: He is inattentive.        Mood and Affect: Mood normal. Affect is blunt.        Speech: He is noncommunicative.    Review of Systems  Unable to perform ROS: Language   Blood pressure (!) 113/92, pulse 93, temperature 98.5 F (36.9 C), temperature source Oral, resp. rate 17, height 5\' 8"  (1.727 m), weight 85.8 kg, SpO2 98 %. Body mass index is 28.76 kg/m.   Treatment Plan Summary: Plan no change to medication.  Supportive therapy.  Encourage group attendance.  Mainly continue waiting for placement  , MD 08/09/2022, 12:56 PM

## 2022-08-09 NOTE — Progress Notes (Signed)
Recreation Therapy Notes    Date: 08/09/2022  Time: 10:05 am   Location: Courtyard      Behavioral response: N/A   Intervention Topic: Leisure    Discussion/Intervention: Patient did not attend group.   Clinical Observations/Feedback:  Patient did not attend group.   Sylus Stgermain LRT/CTRS        Denora Wysocki 08/09/2022 12:34 PM

## 2022-08-09 NOTE — Progress Notes (Addendum)
Pt denies SI/HI/AVH and verbally agrees to approach staff if these become apparent or before harming themselves/others. Rates depression 0/10. Rates anxiety 0/10. Rates pain 0/10. Pt has been in his room for most of the day. Pt has not been as irritable today. Pt has been getting frustrated at his phone and needed some prompting to evening medications due to being frustrated. Pt wanted to be madonna today. Pt was walking around at one point smiling a lot and doing a thumbs up. Pt towards the end of the shift become irritable because he kept wanting to change his name on his door. Pt started to yell and go up to the glass and bang. Pt went into room and started singing. Scheduled medications administered to pt, per MD orders. RN provided support and encouragement to pt. Q15 min safety checks implemented and continued. Pt safe on the unit. RN will continue to monitor and intervene as needed.  Problem: Education: Goal: Knowledge of General Education information will improve Description: Including pain rating scale, medication(s)/side effects and non-pharmacologic comfort measures Outcome: Progressing   Problem: Health Behavior/Discharge Planning: Goal: Ability to manage health-related needs will improve Outcome: Progressing     08/09/22 0929  Psych Admission Type (Psych Patients Only)  Admission Status Involuntary  Psychosocial Assessment  Patient Complaints None  Eye Contact Brief  Facial Expression Animated;Anxious  Affect Appropriate to circumstance  Speech Aphasic;Loud  Interaction Childlike;Assertive;Needy  Motor Activity Slow  Appearance/Hygiene Unremarkable  Behavior Characteristics Cooperative;Anxious  Mood Preoccupied;Anxious  Thought Process  Coherency Circumstantial  Content WDL  Delusions None reported or observed  Perception WDL  Hallucination None reported or observed  Judgment Impaired  Confusion None  Danger to Self  Current suicidal ideation? Denies  Danger to Others   Danger to Others None reported or observed  Danger to Others Abnormal  Harmful Behavior to others No threats or harm toward other people  Destructive Behavior No threats or harm toward property

## 2022-08-09 NOTE — Progress Notes (Signed)
Patient calm and pleasant during assessment denying SI/HI/AVH. Pt observed interacting appropriately with staff and peers on the unit. Pt compliant with medication administration per MD orders. Pt given education, support, and encouragement to be active in his treatment plan. Pt being monitored Q 15 minutes for safety per unit protocol, remains safe on the unit  

## 2022-08-10 NOTE — Progress Notes (Signed)
[facility] MD Progress Note  08/10/2022 2:13 PM Adam [name]  MRN:  [id number] Subjective: No new complaint.  No change to behavior.  Everything about him is normal for him. 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  Been in the hospital stable now for a year  Past Medical History:  Past Medical History:  Diagnosis Date   Myocardial infarction (HCC)    Schizophrenia (HCC)    Stroke (HCC)    TBI (traumatic brain injury) (HCC)    History reviewed. No pertinent surgical history. Family History: History reviewed. No pertinent family history. Family Psychiatric  History: See previous Social History:  Social History   Substance and Sexual Activity  Alcohol Use Not Currently     Social History   Substance and Sexual Activity  Drug Use Not Currently    Social History   Socioeconomic History   Marital status: Single    Spouse name: Not on file   Number of children: Not on file   Years of education: Not on file   Highest education level: Not on file  Occupational History   Not on file  Tobacco Use   Smoking status: Never    Passive exposure: Never   Smokeless tobacco: Never  Vaping Use   Vaping Use: Unknown  Substance and Sexual Activity   Alcohol use: Not Currently   Drug use: Not Currently   Sexual activity: Not Currently  Other Topics Concern   Not on file  Social History Narrative   Not on file   Social Determinants of Health   Financial Resource Strain: Not on file  Food Insecurity: Not on file  Transportation Needs: Not on file  Physical Activity: Not on  file  Stress: Not on file  Social Connections: Not on file   Additional Social History:  Specify valuables returned:  (none)                      Sleep: Fair  Appetite:  Fair  Current Medications: Current Facility-Administered Medications  Medication Dose Route Frequency Provider Last Rate Last Admin   acetaminophen (TYLENOL) tablet 650 mg  650 mg Oral Q6H PRN Mary Secord T, MD   650 mg at 06/20/22 1351   alum & mag hydroxide-simeth (MAALOX/MYLANTA) 200-200-20 MG/5ML suspension 30 mL  30 mL Oral Q4H PRN Oleva Koo T, MD   30 mL at 04/26/22 8937   aspirin EC tablet 81 mg  81 mg Oral Daily Jameelah Watts, [name], MD   81 mg at 08/10/22 3428   atorvastatin (LIPITOR) tablet 20 mg  20 mg Oral Daily Maybelline Kolarik T, MD   20 mg at 08/10/22 0907   atropine 1 % ophthalmic solution 1 drop  1 drop Sublingual QID [name], MD   1 drop at 08/10/22 1140   benztropine (COGENTIN) tablet 0.5 mg  0.5 mg Oral BID Ahyan Kreeger T, MD   0.5 mg at 08/10/22 7681   clonazePAM (KLONOPIN) tablet 1 mg  1 mg Oral TID PRN He, [name], MD   1 mg at 08/06/22 2119   cloZAPine (CLOZARIL) tablet 300 mg  300 mg Oral QHS Ireene Ballowe, [name], MD  300 mg at 08/09/22 2111   diphenhydrAMINE (BENADRYL) capsule 50 mg  50 mg Oral Q6H PRN Emeli Goguen, [name], MD   50 mg at 08/06/22 2119   Or   diphenhydrAMINE (BENADRYL) injection 50 mg  50 mg Intramuscular Q6H PRN Cordarrel Stiefel T, MD       feeding supplement (ENSURE ENLIVE / ENSURE PLUS) liquid 237 mL  237 mL Oral TID BM Garion Wempe T, MD   237 mL at 08/10/22 1350   gabapentin (NEURONTIN) capsule 300 mg  300 mg Oral TID Laquilla Dault T, MD   300 mg at 08/10/22 1140   haloperidol (HALDOL) tablet 5 mg  5 mg Oral TID Soliyana Mcchristian T, MD   5 mg at 08/10/22 1140   haloperidol (HALDOL) tablet 5 mg  5 mg Oral Q6H PRN Decorian Schuenemann T, MD   5 mg at 07/29/22 2102   Or   haloperidol lactate (HALDOL) injection 5 mg  5 mg Intramuscular Q6H PRN Chequita Mofield T, MD       lisinopril  (ZESTRIL) tablet 5 mg  5 mg Oral Daily Mitchael Luckey T, MD   5 mg at 08/10/22 0908   magnesium hydroxide (MILK OF MAGNESIA) suspension 30 mL  30 mL Oral Daily PRN Travor Royce T, MD   30 mL at 05/02/22 1308   metoprolol succinate (TOPROL-XL) 24 hr tablet 25 mg  25 mg Oral Daily Renda Pohlman T, MD   25 mg at 08/10/22 6578   paliperidone (INVEGA SUSTENNA) injection 156 mg  156 mg Intramuscular Q28 days Tjuana Vickrey T, MD   156 mg at 07/19/22 1029   temazepam (RESTORIL) capsule 15 mg  15 mg Oral QHS Spurgeon Gancarz T, MD   15 mg at 08/09/22 2111   ziprasidone (GEODON) injection 20 mg  20 mg Intramuscular Q12H PRN Leonarda Leis, [name], MD   20 mg at 07/08/22 1453    Lab Results: No results found for this or any previous visit (from the past 48 hour(s)).  Blood Alcohol level:  Lab Results  Component Value Date   ETH <10 07/20/2021    Metabolic Disorder Labs: Lab Results  Component Value Date   HGBA1C 5.5 04/10/2022   MPG 111.15 04/10/2022   No results found for: "PROLACTIN" Lab Results  Component Value Date   CHOL 167 11/20/2021   TRIG 107 11/20/2021   HDL 26 (L) 11/20/2021   CHOLHDL 6.4 11/20/2021   VLDL 21 11/20/2021   LDLCALC 120 (H) 11/20/2021    Physical Findings: AIMS: Facial and Oral Movements Muscles of Facial Expression: None, normal Lips and Perioral Area: None, normal Jaw: None, normal Tongue: None, normal,Extremity Movements Upper (arms, wrists, hands, fingers): None, normal Lower (legs, knees, ankles, toes): None, normal, Trunk Movements Neck, shoulders, hips: None, normal, Overall Severity Severity of abnormal movements (highest score from questions above): None, normal Incapacitation due to abnormal movements: None, normal Patient's awareness of abnormal movements (rate only patient's report): No Awareness, Dental Status Current problems with teeth and/or dentures?: No Does patient usually wear dentures?: No  CIWA:    COWS:     Musculoskeletal: Strength &  Muscle Tone: within normal limits Gait & Station: normal Patient leans: N/A  Psychiatric Specialty Exam:  Presentation  General Appearance: Appropriate for Environment; Casual; Neat; Well Groomed  Eye Contact:Fair  Speech:Slow  Speech Volume:Decreased  Handedness:Left   Mood and Affect  Mood:Anxious  Affect:Congruent   Thought Process  Thought Processes:Goal Directed  Descriptions of Associations:Circumstantial  Orientation:Partial  Thought Content:Scattered  History of Schizophrenia/Schizoaffective disorder:Yes  Duration of Psychotic Symptoms:Greater than six months  Hallucinations:No data recorded Ideas of Reference:None  Suicidal Thoughts:No data recorded Homicidal Thoughts:No data recorded  Sensorium  Memory:Remote Good  Judgment:Fair  Insight:Fair   Executive Functions  Concentration:Fair  Attention Span:Fair  Recall:Fair  Fund of Knowledge:Fair  Language:Fair   Psychomotor Activity  Psychomotor Activity:No data recorded  Assets  Assets:No data recorded  Sleep  Sleep:No data recorded   Physical Exam: Physical Exam Vitals and nursing note reviewed.  Constitutional:      Appearance: Normal appearance.  HENT:     Head: Normocephalic and atraumatic.     Mouth/Throat:     Pharynx: Oropharynx is clear.  Eyes:     Pupils: Pupils are equal, round, and reactive to light.  Cardiovascular:     Rate and Rhythm: Normal rate and regular rhythm.  Pulmonary:     Effort: Pulmonary effort is normal.     Breath sounds: Normal breath sounds.  Abdominal:     General: Abdomen is flat.     Palpations: Abdomen is soft.  Musculoskeletal:        General: Normal range of motion.  Skin:    General: Skin is warm and dry.  Neurological:     General: No focal deficit present.     Mental Status: He is alert. Mental status is at baseline.  Psychiatric:        Attention and Perception: He is inattentive.        Mood and Affect: Mood normal.         Speech: He is noncommunicative.    Review of Systems  Unable to perform ROS: Psychiatric disorder   Blood pressure (!) 135/97, pulse 85, temperature 98.5 F (36.9 C), temperature source Oral, resp. rate 17, height 5\' 8"  (1.727 m), weight 85.8 kg, SpO2 100 %. Body mass index is 28.76 kg/m.   Treatment Plan Summary: Plan no change to medication.  Supportive encouragement.  Placement remains paramount issue  , MD 08/10/2022, 2:13 PM

## 2022-08-10 NOTE — Progress Notes (Signed)
Recreation Therapy Notes   Date: 08/10/2022  Time: 10:55 am   Location: Craft room       Behavioral response: N/A   Intervention Topic: Self-care    Discussion/Intervention: Patient did not attend group.   Clinical Observations/Feedback:  Patient did not attend group.   Anniyah Mood LRT/CTRS        Dailon Sheeran 08/10/2022 1:06 PM

## 2022-08-10 NOTE — [facility] Group Notes (Signed)
[facility] Group Notes:  (Nursing/MHT/Case Management/Adjunct)  Date:  08/10/2022  Time:  1:44 PM  Type of Therapy:   Community Meeting  Participation Level:  Did Not Attend   Lynelle Smoke [facility] 08/10/2022, 1:44 PM

## 2022-08-10 NOTE — Progress Notes (Signed)
Pt remains safe on the unit. Largely non verbal but is smiling and cooperative with meals and meds and is able to make his needs known.  Pt has been allowed by management special privileges due to his TBI and long term status on the unit so he has a cell phone that does internet only. Pt is safe, pt completed his own self inventory today, signed it maddonna and rates his depression anxiety and hopelessness at 0/10 on scale 10 being worst. Denies pain. Requested staff to write Tawni Carnes on room door today. Will con't to monitor.

## 2022-08-10 NOTE — [facility] Group Notes (Signed)
[facility] Group Notes:  (Nursing/MHT/Case Management/Adjunct)  Date:  08/10/2022  Time:  3:32 AM  Type of Therapy:  Group Therapy  Participation Level:  Did Not Attend  Participation Quality:      Affect:      Cognitive:      Insight:  None  Engagement in Group:      Modes of Intervention:      Summary of Progress/Problems:  Diona Browner 08/10/2022, 3:32 AM

## 2022-08-11 NOTE — Progress Notes (Signed)
[facility] MD Progress Note  08/11/2022 11:33 AM Adam [name]  MRN:  [id number] Subjective:  Pt remains safe on the unit. Largely non verbal but is smiling and cooperative with meals and meds and is able to make his needs known.  Pt has been allowed by management special privileges due to his TBI and long term status on the unit so he has a cell phone that does internet only. Pt is safe, pt completed his own self inventory today, signed it maddonna and rates his depression anxiety and hopelessness at 0/10 on scale 10 being worst. Denies pain. Requested staff to write Tawni Carnes on room door today.   Patient reports feeling alright today; he is pleasant and smiling upon approach and replies some questions. He reports sleeping poorly last night but does not want any help with it. He is eating well and is mostly in his room.  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 No pertinent surgical history. Family History: History reviewed. No pertinent family history. Family Psychiatric  History:  Social History:  Social History   Substance and Sexual Activity  Alcohol Use Not Currently     Social History   Substance and Sexual Activity  Drug Use Not Currently    Social History   Socioeconomic History   Marital status: Single    Spouse name: Not on file   Number of children: Not on file   Years of education: Not on file    Highest education level: Not on file  Occupational History   Not on file  Tobacco Use   Smoking status: Never    Passive exposure: Never   Smokeless tobacco: Never  Vaping Use   Vaping Use: Unknown  Substance and Sexual Activity   Alcohol use: Not Currently   Drug use: Not Currently   Sexual activity: Not Currently  Other Topics Concern   Not on file  Social History Narrative   Not on file   Social Determinants of Health   Financial Resource Strain: Not on file  Food Insecurity: Not on file  Transportation Needs: Not on file  Physical Activity: Not on file  Stress: Not on file  Social Connections: Not on file   Additional Social History:  Specify valuables returned:  (none)                      Sleep: Fair  Appetite:  Fair  Current Medications: Current Facility-Administered Medications  Medication Dose Route Frequency Provider Last Rate Last Admin   acetaminophen (TYLENOL) tablet 650 mg  650 mg Oral Q6H PRN [name], [name], MD   650 mg at 06/20/22 1351   alum & mag hydroxide-simeth (MAALOX/MYLANTA) 200-200-20 MG/5ML suspension 30 mL  30 mL Oral Q4H PRN [name], [name], MD   30 mL at 04/26/22 0721   aspirin EC tablet 81 mg  81 mg Oral Daily [name], [name], MD   81 mg at 08/11/22 1022   atorvastatin (  LIPITOR) tablet 20 mg  20 mg Oral Daily [name], [name], MD   20 mg at 08/11/22 1021   atropine 1 % ophthalmic solution 1 drop  1 drop Sublingual QID [name], MD   1 drop at 08/11/22 1022   benztropine (COGENTIN) tablet 0.5 mg  0.5 mg Oral BID [name], [name], MD   0.5 mg at 08/11/22 1021   clonazePAM (KLONOPIN) tablet 1 mg  1 mg Oral TID PRN He, [name], MD   1 mg at 08/10/22 2119   cloZAPine (CLOZARIL) tablet 300 mg  300 mg Oral QHS [name], [name], MD   300 mg at 08/10/22 2119   diphenhydrAMINE (BENADRYL) capsule 50 mg  50 mg Oral Q6H PRN [name], [name], MD   50 mg at 08/06/22 2119   Or   diphenhydrAMINE (BENADRYL) injection 50 mg  50 mg Intramuscular Q6H PRN  [name], [name], MD       feeding supplement (ENSURE ENLIVE / ENSURE PLUS) liquid 237 mL  237 mL Oral TID BM [name], [name], MD   237 mL at 08/10/22 2100   gabapentin (NEURONTIN) capsule 300 mg  300 mg Oral TID [name], [name], MD   300 mg at 08/11/22 1022   haloperidol (HALDOL) tablet 5 mg  5 mg Oral TID [name], [name], MD   5 mg at 08/11/22 1021   haloperidol (HALDOL) tablet 5 mg  5 mg Oral Q6H PRN [name], [name], MD   5 mg at 07/29/22 2102   Or   haloperidol lactate (HALDOL) injection 5 mg  5 mg Intramuscular Q6H PRN [name], [name], MD       lisinopril (ZESTRIL) tablet 5 mg  5 mg Oral Daily [name], [name], MD   5 mg at 08/11/22 1021   magnesium hydroxide (MILK OF MAGNESIA) suspension 30 mL  30 mL Oral Daily PRN [name], [name], MD   30 mL at 05/02/22 7628   metoprolol succinate (TOPROL-XL) 24 hr tablet 25 mg  25 mg Oral Daily [name], [name], MD   25 mg at 08/11/22 1020   paliperidone (INVEGA SUSTENNA) injection 156 mg  156 mg Intramuscular Q28 days [name], [name], MD   156 mg at 07/19/22 1029   temazepam (RESTORIL) capsule 15 mg  15 mg Oral QHS [name], [name], MD   15 mg at 08/10/22 2121   ziprasidone (GEODON) injection 20 mg  20 mg Intramuscular Q12H PRN [name], [name], MD   20 mg at 07/08/22 1453    Lab Results: No results found for this or any previous visit (from the past 48 hour(s)).  Blood Alcohol level:  Lab Results  Component Value Date   ETH <10 07/20/2021    Metabolic Disorder Labs: Lab Results  Component Value Date   HGBA1C 5.5 04/10/2022   MPG 111.15 04/10/2022   No results found for: "PROLACTIN" Lab Results  Component Value Date   CHOL 167 11/20/2021   TRIG 107 11/20/2021   HDL 26 (L) 11/20/2021   CHOLHDL 6.4 11/20/2021   VLDL 21 11/20/2021   LDLCALC 120 (H) 11/20/2021    Physical Findings: AIMS: Facial and Oral Movements Muscles of Facial Expression: None, normal Lips and Perioral Area: None, normal Jaw: None, normal Tongue: None,  normal,Extremity Movements Upper (arms, wrists, hands, fingers): None, normal Lower (legs, knees, ankles, toes): None, normal, Trunk Movements Neck, shoulders, hips: None, normal, Overall Severity Severity of abnormal movements (highest score from questions above): None, normal  Incapacitation due to abnormal movements: None, normal Patient's awareness of abnormal movements (rate only patient's report): No Awareness, Dental Status Current problems with teeth and/or dentures?: No Does patient usually wear dentures?: No  CIWA:    COWS:     Musculoskeletal: Strength & Muscle Tone: within normal limits Gait & Station: normal Patient leans: N/A  Psychiatric Specialty Exam:  Presentation  General Appearance: Appropriate for Environment; Casual; Neat; Well Groomed  Eye Contact:Fair  Speech:Slow  Speech Volume:Decreased  Handedness:Left   Mood and Affect  Mood:Anxious  Affect:Congruent   Thought Process  Thought Processes:Goal Directed  Descriptions of Associations:Circumstantial  Orientation:Partial  Thought Content:Scattered  History of Schizophrenia/Schizoaffective disorder:Yes  Duration of Psychotic Symptoms:Greater than six months  Hallucinations:No data recorded Ideas of Reference:None  Suicidal Thoughts:No data recorded Homicidal Thoughts:No data recorded  Sensorium  Memory:Remote Good  Judgment:Fair  Insight:Fair   Executive Functions  Concentration:Fair  Attention Span:Fair  Recall:Fair  Fund of Knowledge:Fair  Language:Fair   Psychomotor Activity  Psychomotor Activity:No data recorded  Assets  Assets:No data recorded  Sleep  Sleep:No data recorded   Physical Exam: Physical Exam ROS Blood pressure (!) 148/98, pulse 93, temperature 97.7 F (36.5 C), temperature source Oral, resp. rate 20, height 5\' 8"  (1.727 m), weight 85.8 kg, SpO2 100 %. Body mass index is 28.76 kg/m.   Treatment Plan Summary: Daily contact with patient  to assess and evaluate symptoms and progress in treatment, Medication management, and Plan Continue current meds and doses.   08/11/2022, 11:33 AM

## 2022-08-11 NOTE — Plan of Care (Signed)
Pt is generally cooperative, slight irritability noted.  Denies SI no evidence of aggressive behavior. Pt is compliant with medications.  Mildly demanding at times but in good spirits most of the day.  Safety maintained; continue to monitor via 15 minute checks.

## 2022-08-12 NOTE — Progress Notes (Signed)
[facility] MD Progress Note  08/12/2022 10:15 AM Adam [name]  MRN:  [id number] Subjective:  Patient alert and oriented x 2 with confusion to time and situation, he appears anxious agitated and irritable especially when on the phone with mother. Patient was offered emotional support, he was receptive and he denies SI/HI/AVH.  Patient remains in his room, is generally calm and compliant. He reports feeling alright today and deneis major issues. He did not want to talk much and stayed quiet.  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 No pertinent surgical history. Family History: History reviewed. No pertinent family history. Family Psychiatric  History: History reviewed. No pertinent family history. Social History:  Social History   Substance and Sexual Activity  Alcohol Use Not Currently     Social History   Substance and Sexual Activity  Drug Use Not Currently    Social History   Socioeconomic History   Marital status: Single    Spouse name: Not on file   Number of children: Not on file   Years of education: Not on file   Highest education level: Not on file  Occupational History   Not on file  Tobacco Use   Smoking status: Never    Passive exposure: Never   Smokeless tobacco: Never  Vaping Use   Vaping Use: Unknown  Substance and Sexual Activity   Alcohol use: Not Currently   Drug use: Not Currently    Sexual activity: Not Currently  Other Topics Concern   Not on file  Social History Narrative   Not on file   Social Determinants of Health   Financial Resource Strain: Not on file  Food Insecurity: Not on file  Transportation Needs: Not on file  Physical Activity: Not on file  Stress: Not on file  Social Connections: Not on file   Additional Social History:  Specify valuables returned:  (none)                      Sleep: Fair  Appetite:  Fair  Current Medications: Current Facility-Administered Medications  Medication Dose Route Frequency Provider Last Rate Last Admin   acetaminophen (TYLENOL) tablet 650 mg  650 mg Oral Q6H PRN [name], [name], MD   650 mg at 06/20/22 1351   alum & mag hydroxide-simeth (MAALOX/MYLANTA) 200-200-20 MG/5ML suspension 30 mL  30 mL Oral Q4H PRN [name], [name], MD   30 mL at 04/26/22 7342   aspirin EC tablet 81 mg  81 mg Oral Daily [name], [name], MD   81 mg at 08/12/22 0900   atorvastatin (LIPITOR) tablet 20 mg  20 mg Oral Daily [name], [name], MD   20 mg at 08/12/22 0900   atropine 1 % ophthalmic solution 1 drop  1 drop Sublingual QID [name], MD   1 drop at 08/12/22 0900   benztropine (COGENTIN) tablet 0.5 mg  0.5 mg Oral BID [name], [name], MD   0.5 mg  at 08/12/22 0900   clonazePAM (KLONOPIN) tablet 1 mg  1 mg Oral TID PRN He, [name], MD   1 mg at 08/11/22 2139   cloZAPine (CLOZARIL) tablet 300 mg  300 mg Oral QHS [name], [name], MD   300 mg at 08/11/22 2139   diphenhydrAMINE (BENADRYL) capsule 50 mg  50 mg Oral Q6H PRN [name], [name], MD   50 mg at 08/06/22 2119   Or   diphenhydrAMINE (BENADRYL) injection 50 mg  50 mg Intramuscular Q6H PRN [name], [name], MD       feeding supplement (ENSURE ENLIVE / ENSURE PLUS) liquid 237 mL  237 mL Oral TID BM [name], [name], MD   237 mL at 08/12/22 1009   gabapentin (NEURONTIN) capsule 300 mg  300 mg Oral TID [name], [name], MD   300 mg at 08/12/22 0900   haloperidol (HALDOL) tablet 5 mg   5 mg Oral TID [name], [name], MD   5 mg at 08/12/22 0900   haloperidol (HALDOL) tablet 5 mg  5 mg Oral Q6H PRN [name], [name], MD   5 mg at 07/29/22 2102   Or   haloperidol lactate (HALDOL) injection 5 mg  5 mg Intramuscular Q6H PRN [name], [name], MD       lisinopril (ZESTRIL) tablet 5 mg  5 mg Oral Daily [name], [name], MD   5 mg at 08/12/22 0900   magnesium hydroxide (MILK OF MAGNESIA) suspension 30 mL  30 mL Oral Daily PRN [name], [name], MD   30 mL at 05/02/22 3762   metoprolol succinate (TOPROL-XL) 24 hr tablet 25 mg  25 mg Oral Daily [name], [name], MD   25 mg at 08/12/22 0900   paliperidone (INVEGA SUSTENNA) injection 156 mg  156 mg Intramuscular Q28 days [name], [name], MD   156 mg at 07/19/22 1029   temazepam (RESTORIL) capsule 15 mg  15 mg Oral QHS [name], [name], MD   15 mg at 08/11/22 2139   ziprasidone (GEODON) injection 20 mg  20 mg Intramuscular Q12H PRN [name], [name], MD   20 mg at 07/08/22 1453    Lab Results: No results found for this or any previous visit (from the past 48 hour(s)).  Blood Alcohol level:  Lab Results  Component Value Date   ETH <10 07/20/2021    Metabolic Disorder Labs: Lab Results  Component Value Date   HGBA1C 5.5 04/10/2022   MPG 111.15 04/10/2022   No results found for: "PROLACTIN" Lab Results  Component Value Date   CHOL 167 11/20/2021   TRIG 107 11/20/2021   HDL 26 (L) 11/20/2021   CHOLHDL 6.4 11/20/2021   VLDL 21 11/20/2021   LDLCALC 120 (H) 11/20/2021    Physical Findings: AIMS: Facial and Oral Movements Muscles of Facial Expression: None, normal Lips and Perioral Area: None, normal Jaw: None, normal Tongue: None, normal,Extremity Movements Upper (arms, wrists, hands, fingers): None, normal Lower (legs, knees, ankles, toes): None, normal, Trunk Movements Neck, shoulders, hips: None, normal, Overall Severity Severity of abnormal movements (highest score from questions above): None, normal Incapacitation due to  abnormal movements: None, normal Patient's awareness of abnormal movements (rate only patient's report): No Awareness, Dental Status Current problems with teeth and/or dentures?: No Does patient usually wear dentures?: No  CIWA:    COWS:     Musculoskeletal: Strength & Muscle Tone: within normal limits Gait & Station: normal Patient leans: N/A  Psychiatric Specialty Exam:  Presentation  General Appearance: Appropriate for Environment; Casual; Neat; Well Groomed  Eye Contact:Fair  Speech:Slow  Speech Volume:Decreased  Handedness:Left   Mood and Affect  Mood:Anxious  Affect:Congruent   Thought Process  Thought Processes:Goal Directed  Descriptions of Associations:Circumstantial  Orientation:Partial  Thought Content:Scattered  History of Schizophrenia/Schizoaffective disorder:Yes  Duration of Psychotic Symptoms:Greater than six months  Hallucinations:No data recorded Ideas of Reference:None  Suicidal Thoughts:No data recorded Homicidal Thoughts:No data recorded  Sensorium  Memory:Remote Good  Judgment:Fair  Insight:Fair   Executive Functions  Concentration:Fair  Attention Span:Fair  Recall:Fair  Fund of Knowledge:Fair  Language:Fair   Psychomotor Activity  Psychomotor Activity:No data recorded  Assets  Assets:No data recorded  Sleep  Sleep:No data recorded   Physical Exam: Physical Exam Constitutional:      Appearance: Normal appearance. He is normal weight.  HENT:     Head: Normocephalic and atraumatic.     Right Ear: External ear normal.     Left Ear: External ear normal.     Nose: Nose normal.     Mouth/Throat:     Mouth: Mucous membranes are moist.     Pharynx: Oropharynx is clear.  Eyes:     Extraocular Movements: Extraocular movements intact.     Conjunctiva/sclera: Conjunctivae normal.     Pupils: Pupils are equal, round, and reactive to light.  Cardiovascular:     Rate and Rhythm: Normal rate and regular rhythm.      Pulses: Normal pulses.     Heart sounds: Normal heart sounds.  Pulmonary:     Effort: Pulmonary effort is normal.     Breath sounds: Normal breath sounds.  Abdominal:     General: Abdomen is flat. Bowel sounds are normal.     Palpations: Abdomen is soft.  Musculoskeletal:        General: Normal range of motion.     Cervical back: Normal range of motion and neck supple.  Skin:    General: Skin is warm and dry.  Neurological:     General: No focal deficit present.     Mental Status: He is alert. Mental status is at baseline.    Review of Systems  Constitutional: Negative.   HENT: Negative.    Eyes: Negative.   Respiratory: Negative.    Cardiovascular: Negative.   Gastrointestinal: Negative.   Genitourinary: Negative.   Musculoskeletal: Negative.   Skin: Negative.   Neurological: Negative.   Endo/Heme/Allergies: Negative.   Psychiatric/Behavioral:  The patient is nervous/anxious.    Blood pressure (!) 133/105, pulse 93, temperature 97.9 F (36.6 C), temperature source Oral, resp. rate 18, height 5\' 8"  (1.727 m), weight 85.8 kg, SpO2 100 %. Body mass index is 28.76 kg/m.   Treatment Plan Summary: Daily contact with patient to assess and evaluate symptoms and progress in treatment, Medication management, and Plan Continue current meds and doses.   Adam [name] 08/12/2022, 10:15 AM

## 2022-08-12 NOTE — Group Note (Deleted)
[facility] LCSW Group Therapy Note   Group Date: 08/12/2022 Start Time: 1200 End Time: 1300   Type of Therapy/Topic:  Group Therapy:  Emotion Regulation  Participation Level:  {[facility] PARTICIPATION LEVEL:22264}   Mood:  Description of Group:    The purpose of this group is to assist patients in learning to regulate negative emotions and experience positive emotions. Patients will be guided to discuss ways in which they have been vulnerable to their negative emotions. These vulnerabilities will be juxtaposed with experiences of positive emotions or situations, and patients challenged to use positive emotions to combat negative ones. Special emphasis will be placed on coping with negative emotions in conflict situations, and patients will process healthy conflict resolution skills.  Therapeutic Goals: 1. Patient will identify two positive emotions or experiences to reflect on in order to balance out negative emotions:  2. Patient will label two or more emotions that they find the most difficult to experience:  3. Patient will be able to demonstrate positive conflict resolution skills through discussion or role plays:   Summary of Patient Progress:   ***    Therapeutic Modalities:   Cognitive Behavioral Therapy Feelings Identification Dialectical Behavioral Therapy   Corky Crafts, LCSWA

## 2022-08-12 NOTE — Plan of Care (Signed)
Patient at nurses station multiple times with his phone. No outburst noted today. Compliant with medications and meals. Denies SI,HI and AVH. Appetite and energy level good. ADLs maintained. Support and encouragement given.

## 2022-08-12 NOTE — Progress Notes (Signed)
Patient alert and oriented x 2 with confusion to time and situation, he appears anxious agitated and irritable especially when on the phone with mother. Patient was offered emotional support, he was receptive and he denies SI/HI/AVH, 15 minutes safety checks maintained will continue to monitor closely.

## 2022-08-13 NOTE — Progress Notes (Signed)
[facility] MD Progress Note  08/13/2022 3:09 PM Adam [name]  MRN:  [id number] Subjective: No change in presentation.  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 No pertinent surgical history. Family History: History reviewed. No pertinent family history. Family Psychiatric  History: See previous Social History:  Social History   Substance and Sexual Activity  Alcohol Use Not Currently     Social History   Substance and Sexual Activity  Drug Use Not Currently    Social History   Socioeconomic History   Marital status: Single    Spouse name: Not on file   Number of children: Not on file   Years of education: Not on file   Highest education level: Not on file  Occupational History   Not on file  Tobacco Use   Smoking status: Never    Passive exposure: Never   Smokeless tobacco: Never  Vaping Use   Vaping Use: Unknown  Substance and Sexual Activity   Alcohol use: Not Currently   Drug use: Not Currently   Sexual activity: Not Currently  Other Topics Concern   Not on file  Social History Narrative   Not on file   Social Determinants of Health   Financial Resource Strain: Not on file  Food Insecurity: Not on file  Transportation Needs: Not on file  Physical Activity: Not on  file  Stress: Not on file  Social Connections: Not on file   Additional Social History:  Specify valuables returned:  (none)                      Sleep: Fair  Appetite:  Fair  Current Medications: Current Facility-Administered Medications  Medication Dose Route Frequency Provider Last Rate Last Admin   acetaminophen (TYLENOL) tablet 650 mg  650 mg Oral Q6H PRN Muslima Toppins T, MD   650 mg at 06/20/22 1351   alum & mag hydroxide-simeth (MAALOX/MYLANTA) 200-200-20 MG/5ML suspension 30 mL  30 mL Oral Q4H PRN Bernise Sylvain T, MD   30 mL at 04/26/22 2505   aspirin EC tablet 81 mg  81 mg Oral Daily Fortune Torosian, [name], MD   81 mg at 08/13/22 0846   atorvastatin (LIPITOR) tablet 20 mg  20 mg Oral Daily Keerthi Hazell T, MD   20 mg at 08/13/22 0846   atropine 1 % ophthalmic solution 1 drop  1 drop Sublingual QID [name], MD   1 drop at 08/13/22 1239   benztropine (COGENTIN) tablet 0.5 mg  0.5 mg Oral BID Adi Seales T, MD   0.5 mg at 08/13/22 0845   clonazePAM (KLONOPIN) tablet 1 mg  1 mg Oral TID PRN He, [name], MD   1 mg at 08/12/22 2137   cloZAPine (CLOZARIL) tablet 300 mg  300 mg Oral QHS Merrill Villarruel, [name], MD   300  mg at 08/12/22 2137   diphenhydrAMINE (BENADRYL) capsule 50 mg  50 mg Oral Q6H PRN Mouna Yager, [name], MD   50 mg at 08/06/22 2119   Or   diphenhydrAMINE (BENADRYL) injection 50 mg  50 mg Intramuscular Q6H PRN Reghan Thul T, MD       feeding supplement (ENSURE ENLIVE / ENSURE PLUS) liquid 237 mL  237 mL Oral TID BM Raidyn Breiner T, MD   237 mL at 08/12/22 2100   gabapentin (NEURONTIN) capsule 300 mg  300 mg Oral TID Hisashi Amadon T, MD   300 mg at 08/13/22 1239   haloperidol (HALDOL) tablet 5 mg  5 mg Oral TID Kynsleigh Westendorf, [name], MD   5 mg at 08/13/22 1239   haloperidol (HALDOL) tablet 5 mg  5 mg Oral Q6H PRN Caelan Atchley T, MD   5 mg at 07/29/22 2102   Or   haloperidol lactate (HALDOL) injection 5 mg  5 mg Intramuscular Q6H PRN Brenen Beigel T, MD       lisinopril  (ZESTRIL) tablet 5 mg  5 mg Oral Daily Saadiq Poche T, MD   5 mg at 08/13/22 0846   magnesium hydroxide (MILK OF MAGNESIA) suspension 30 mL  30 mL Oral Daily PRN Yides Saidi T, MD   30 mL at 05/02/22 9470   metoprolol succinate (TOPROL-XL) 24 hr tablet 25 mg  25 mg Oral Daily Zenas Santa T, MD   25 mg at 08/13/22 0846   paliperidone (INVEGA SUSTENNA) injection 156 mg  156 mg Intramuscular Q28 days Linkon Siverson T, MD   156 mg at 07/19/22 1029   temazepam (RESTORIL) capsule 15 mg  15 mg Oral QHS Jinelle Butchko T, MD   15 mg at 08/12/22 2137   ziprasidone (GEODON) injection 20 mg  20 mg Intramuscular Q12H PRN Nettie Wyffels, [name], MD   20 mg at 07/08/22 1453    Lab Results: No results found for this or any previous visit (from the past 48 hour(s)).  Blood Alcohol level:  Lab Results  Component Value Date   ETH <10 07/20/2021    Metabolic Disorder Labs: Lab Results  Component Value Date   HGBA1C 5.5 04/10/2022   MPG 111.15 04/10/2022   No results found for: "PROLACTIN" Lab Results  Component Value Date   CHOL 167 11/20/2021   TRIG 107 11/20/2021   HDL 26 (L) 11/20/2021   CHOLHDL 6.4 11/20/2021   VLDL 21 11/20/2021   LDLCALC 120 (H) 11/20/2021    Physical Findings: AIMS: Facial and Oral Movements Muscles of Facial Expression: None, normal Lips and Perioral Area: None, normal Jaw: None, normal Tongue: None, normal,Extremity Movements Upper (arms, wrists, hands, fingers): None, normal Lower (legs, knees, ankles, toes): None, normal, Trunk Movements Neck, shoulders, hips: None, normal, Overall Severity Severity of abnormal movements (highest score from questions above): None, normal Incapacitation due to abnormal movements: None, normal Patient's awareness of abnormal movements (rate only patient's report): No Awareness, Dental Status Current problems with teeth and/or dentures?: No Does patient usually wear dentures?: No  CIWA:    COWS:     Musculoskeletal: Strength &  Muscle Tone: within normal limits Gait & Station: normal Patient leans: N/A  Psychiatric Specialty Exam:  Presentation  General Appearance: Appropriate for Environment; Casual; Neat; Well Groomed  Eye Contact:Fair  Speech:Slow  Speech Volume:Decreased  Handedness:Left   Mood and Affect  Mood:Anxious  Affect:Congruent   Thought Process  Thought Processes:Goal Directed  Descriptions of Associations:Circumstantial  Orientation:Partial  Thought  Content:Scattered  History of Schizophrenia/Schizoaffective disorder:Yes  Duration of Psychotic Symptoms:Greater than six months  Hallucinations:No data recorded Ideas of Reference:None  Suicidal Thoughts:No data recorded Homicidal Thoughts:No data recorded  Sensorium  Memory:Remote Good  Judgment:Fair  Insight:Fair   Executive Functions  Concentration:Fair  Attention Span:Fair  [employer]   Psychomotor Activity  Psychomotor Activity:No data recorded  Assets  Assets:No data recorded  Sleep  Sleep:No data recorded   Physical Exam: Physical Exam Vitals and nursing note reviewed.  Constitutional:      Appearance: Normal appearance.  HENT:     Head: Normocephalic and atraumatic.     Mouth/Throat:     Pharynx: Oropharynx is clear.  Eyes:     Pupils: Pupils are equal, round, and reactive to light.  Cardiovascular:     Rate and Rhythm: Normal rate and regular rhythm.  Pulmonary:     Effort: Pulmonary effort is normal.     Breath sounds: Normal breath sounds.  Abdominal:     General: Abdomen is flat.     Palpations: Abdomen is soft.  Musculoskeletal:        General: Normal range of motion.  Skin:    General: Skin is warm and dry.  Neurological:     General: No focal deficit present.     Mental Status: He is alert. Mental status is at baseline.  Psychiatric:        Attention and Perception: Attention normal.        Mood and Affect: Affect is blunt.         Speech: Speech is tangential.    Review of Systems  Unable to perform ROS: Language   Blood pressure (!) 128/91, pulse 93, temperature 98.1 F (36.7 C), temperature source Oral, resp. rate 18, height 5\' 8"  (1.727 m), weight 85.8 kg, SpO2 100 %. Body mass index is 28.76 kg/m.   Treatment Plan Summary: Medication management and Plan no change to medication.  Encouragement supportive counseling.  Spoke with treatment team today and was told that there is a suspicion that no one is working on finding any placement for him.  We will try to follow up with the social work staff.  Alethia Berthold, MD 08/13/2022, 3:09 PM

## 2022-08-13 NOTE — [facility] Group Notes (Signed)
[facility] Group Notes:  (Nursing/MHT/Case Management/Adjunct)  Date:  08/13/2022  Time:  10:50 PM  Type of Therapy:  Group Therapy Wrap Up   Participation Level:  Active  Participation Quality:  Attentive and Intrusive  Affect:  Excited  Cognitive:  Alert and Disorganized  Insight:  Good and Limited  Engagement in Group:  Engaged and Limited  Modes of Intervention:  Discussion  Summary of Progress/Problems: Pt actively tries to participate in group. Pt obviously has something to say, but due to TBI can not formulate correct vocabulary that is congruent to thought. Words get stuck on repeat. Thoughts get stuck on repeat.   [name],Shariff Lasky E 08/13/2022, 10:50 PM

## 2022-08-13 NOTE — Progress Notes (Signed)
Patient is alert x 3 with periods of confusion to time, he appears irritable because his phone calls to his mother was not answered. Patient was offered emotional support and encouragement . Patient denies SI/HI/AVH, 15 minutes safety checks maintained will continue to monitor closely.

## 2022-08-13 NOTE — TOC Progression Note (Signed)
Transition of Care [facility]) - Progression Note    Patient Details  Name: Adam [name] MRN: [id number] Date of Birth: [birth year]  Transition of Care [facility] CM/SW Contact  Margarito Liner, LCSW Phone Number: 08/13/2022, 3:18 PM  Clinical Narrative:  Spoke with Tempie Donning with [facility]. She has been assigned to assist with transition to next level of care. Sent her updated FL2. Starting Wednesday, TOC is going to have weekly meetings with [facility] to discuss plan and updates.    Expected Discharge Plan and Services                                                 Social Determinants of Health (SDOH) Interventions    Readmission Risk Interventions     No data to display

## 2022-08-13 NOTE — Progress Notes (Signed)
Recreation Therapy Notes  Date: 08/13/2022  Time: 10:30 am   Location: Craft room       Behavioral response: N/A   Intervention Topic: Problem- Solving    Discussion/Intervention: Patient did not attend group.   Clinical Observations/Feedback:  Patient did not attend group.   Eldora Napp LRT/CTRS        Rondel Episcopo 08/13/2022 12:31 PM

## 2022-08-13 NOTE — Progress Notes (Signed)
Patient calm and pleasant during assessment denying SI/HI/AVH. Pt observed interacting appropriately with staff and peers on the unit. Pt compliant with medication administration per MD orders. Pt given education, support, and encouragement to be active in his treatment plan. Pt being monitored Q 15 minutes for safety per unit protocol, remains safe on the unit  

## 2022-08-13 NOTE — [place] FL2 (Signed)
[place] MEDICAID FL2 LEVEL OF CARE SCREENING TOOL     IDENTIFICATION  Patient Name: Adam [name] Birthdate: 1960/12/05 Sex: male Admission Date (Current Location): 03/19/2022  [facility] and [place] Number:  Chiropodist and Address:  [facility], [address] [place], [place] [address]      Provider Number: [id number]  Attending Physician Name and Address:  [name], [name], MD  Relative Name and Phone Number:       Current Level of Care: Hospital Recommended Level of Care: Family Care Home Prior Approval Number:    Date Approved/Denied:   PASRR Number:    Discharge Plan: Other (Comment) [facility])    Current Diagnoses: Patient Active Problem List   Diagnosis Date Noted   Difficulty controlling behavior as late effect of traumatic brain injury (HCC) 03/19/2022   TBI (traumatic brain injury) (HCC) 11/27/2021   NSTEMI (non-ST elevated myocardial infarction) (HCC) 11/21/2021   DNR (do not resuscitate)/DNI(Do Not Intubate) 11/20/2021   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) 07/25/2021   Schizophrenia, chronic condition with acute exacerbation (HCC) 07/20/2021    Orientation RESPIRATION BLADDER Height & Weight     Self, Situation (Fluctuates)  Normal Incontinent Weight: 189 lb 2.5 oz (85.8 kg) Height:  5\' 8"  (172.7 cm)  BEHAVIORAL SYMPTOMS/MOOD NEUROLOGICAL BOWEL NUTRITION STATUS  Other (Comment) (Cooperative.)  (TBI) Continent Diet (Regular)  AMBULATORY STATUS COMMUNICATION OF NEEDS Skin   Independent Verbally Skin abrasions, Bruising, Other (Comment) (Erythema/redness, rash.)                       Personal Care Assistance Level of Assistance  Bathing, Feeding, Dressing Bathing Assistance: Limited assistance Feeding assistance: Limited assistance Dressing Assistance: Limited assistance     Functional Limitations Info  Sight, Hearing, Speech Sight Info: Adequate Hearing Info: Adequate Speech  Info: Adequate    SPECIAL CARE FACTORS FREQUENCY                       Contractures Contractures Info: Not present    Additional Factors Info  Code Status, Allergies, Psychotropic Code Status Info: Full code Allergies Info: Morphine and related, Nsaids Psychotropic Info: Schizophrenia         Current Medications (08/13/2022):  This is the current hospital active medication list Current Facility-Administered Medications  Medication Dose Route Frequency Provider Last Rate Last Admin   acetaminophen (TYLENOL) tablet 650 mg  650 mg Oral Q6H PRN [name], [name], MD   650 mg at 06/20/22 1351   alum & mag hydroxide-simeth (MAALOX/MYLANTA) 200-200-20 MG/5ML suspension 30 mL  30 mL Oral Q4H PRN [name], [name], MD   30 mL at 04/26/22 04/28/22   aspirin EC tablet 81 mg  81 mg Oral Daily [name], [id number], MD   81 mg at 08/13/22 0846   atorvastatin (LIPITOR) tablet 20 mg  20 mg Oral Daily [name], [name], MD   20 mg at 08/13/22 0846   atropine 1 % ophthalmic solution 1 drop  1 drop Sublingual QID 10/13/22, MD   1 drop at 08/13/22 1239   benztropine (COGENTIN) tablet 0.5 mg  0.5 mg Oral BID [name], [name], MD   0.5 mg at 08/13/22 0845   clonazePAM (KLONOPIN) tablet 1 mg  1 mg Oral TID PRN He, [name], MD   1 mg at 08/12/22 2137   cloZAPine (CLOZARIL) tablet 300 mg  300 mg Oral QHS [name], 2138, MD  300 mg at 08/12/22 2137   diphenhydrAMINE (BENADRYL) capsule 50 mg  50 mg Oral Q6H PRN [name], [name], MD   50 mg at 08/06/22 2119   Or   diphenhydrAMINE (BENADRYL) injection 50 mg  50 mg Intramuscular Q6H PRN [name], [name], MD       feeding supplement (ENSURE ENLIVE / ENSURE PLUS) liquid 237 mL  237 mL Oral TID BM [name], [name], MD   237 mL at 08/12/22 2100   gabapentin (NEURONTIN) capsule 300 mg  300 mg Oral TID [name], [name], MD   300 mg at 08/13/22 1239   haloperidol (HALDOL) tablet 5 mg  5 mg Oral TID [name], [name], MD   5 mg at 08/13/22 1239   haloperidol (HALDOL) tablet 5 mg  5  mg Oral Q6H PRN [name], [name], MD   5 mg at 07/29/22 2102   Or   haloperidol lactate (HALDOL) injection 5 mg  5 mg Intramuscular Q6H PRN [name], [name], MD       lisinopril (ZESTRIL) tablet 5 mg  5 mg Oral Daily [name], [name], MD   5 mg at 08/13/22 0846   magnesium hydroxide (MILK OF MAGNESIA) suspension 30 mL  30 mL Oral Daily PRN [name], [name], MD   30 mL at 05/02/22 1017   metoprolol succinate (TOPROL-XL) 24 hr tablet 25 mg  25 mg Oral Daily [name], [name], MD   25 mg at 08/13/22 0846   paliperidone (INVEGA SUSTENNA) injection 156 mg  156 mg Intramuscular Q28 days [name], [name], MD   156 mg at 07/19/22 1029   temazepam (RESTORIL) capsule 15 mg  15 mg Oral QHS [name], [name], MD   15 mg at 08/12/22 2137   ziprasidone (GEODON) injection 20 mg  20 mg Intramuscular Q12H PRN [name], [name], MD   20 mg at 07/08/22 1453     Discharge Medications: Please see discharge summary for a list of discharge medications.  Relevant Imaging Results:  Relevant Lab Results:   Additional Information SS#: [id number]  Margarito Liner, LCSW

## 2022-08-13 NOTE — Plan of Care (Signed)
D- Patient alert and oriented to person and situation. Patient presented in a pleasant mood on assessment, with no complaints to voice to this Clinical research associate. 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 Patient answers questions with a "yes" or "no" and smiling. Patient denied SI, HI, AVH, and pain at this time. Patient also denied any signs/symptoms of depression/anxiety. When this writer asked patient if he was feeling good, he smiled, said "yeah", and walked off. Patient had no stated goals for today.   A- Scheduled medications administered to patient, per MD orders. Support and encouragement provided.  Routine safety checks conducted every 15 minutes.  Patient informed to notify staff with problems or concerns.  R- No adverse drug reactions noted. Patient contracts for safety at this time. Patient compliant with medications. Patient receptive, calm, and cooperative. Patient remains safe at this time.  Problem: Education: Goal: Ability to state activities that reduce stress will improve Outcome: Progressing   Problem: Coping: Goal: Ability to identify and develop effective coping behavior will improve Outcome: Progressing   Problem: Self-Concept: Goal: Ability to identify factors that promote anxiety will improve Outcome: Progressing Goal: Level of anxiety will decrease Outcome: Progressing Goal: Ability to modify response to factors that promote anxiety will improve Outcome: Progressing   Problem: Education: Goal: Knowledge of General Education information will improve Description: Including pain rating scale, medication(s)/side effects and non-pharmacologic comfort measures Outcome: Progressing   Problem: Health Behavior/Discharge Planning: Goal: Ability to manage health-related needs will improve Outcome: Progressing   Problem: Clinical Measurements: Goal: Ability to maintain clinical measurements within normal limits will improve Outcome:  Progressing Goal: Will remain free from infection Outcome: Progressing Goal: Diagnostic test results will improve Outcome: Progressing Goal: Respiratory complications will improve Outcome: Progressing Goal: Cardiovascular complication will be avoided Outcome: Progressing   Problem: Activity: Goal: Risk for activity intolerance will decrease Outcome: Progressing   Problem: Nutrition: Goal: Adequate nutrition will be maintained Outcome: Progressing   Problem: Coping: Goal: Level of anxiety will decrease Outcome: Progressing   Problem: Elimination: Goal: Will not experience complications related to bowel motility Outcome: Progressing Goal: Will not experience complications related to urinary retention Outcome: Progressing   Problem: Pain Managment: Goal: General experience of comfort will improve Outcome: Progressing   Problem: Safety: Goal: Ability to remain free from injury will improve Outcome: Progressing   Problem: Skin Integrity: Goal: Risk for impaired skin integrity will decrease Outcome: Progressing   Problem: Activity: Goal: Interest or engagement in leisure activities will improve Outcome: Progressing   Problem: Coping: Goal: Coping ability will improve Outcome: Progressing   Problem: Education: Goal: Will be free of psychotic symptoms Outcome: Progressing

## 2022-08-14 NOTE — Progress Notes (Signed)
Patient was given his scheduled morning medication at 1222, in which he tolerated it well, without any issues. Patient remains safe on the unit and will continue to monitor.

## 2022-08-14 NOTE — Plan of Care (Signed)
  Problem: Coping: Goal: Ability to identify and develop effective coping behavior will improve Outcome: Progressing   Problem: Self-Concept: Goal: Ability to identify factors that promote anxiety will improve Outcome: Progressing Goal: Level of anxiety will decrease Outcome: Progressing Goal: Ability to modify response to factors that promote anxiety will improve Outcome: Progressing   Problem: Education: Goal: Knowledge of General Education information will improve Description: Including pain rating scale, medication(s)/side effects and non-pharmacologic comfort measures Outcome: Progressing   Problem: Clinical Measurements: Goal: Ability to maintain clinical measurements within normal limits will improve Outcome: Progressing Goal: Will remain free from infection Outcome: Progressing Goal: Diagnostic test results will improve Outcome: Progressing Goal: Respiratory complications will improve Outcome: Progressing Goal: Cardiovascular complication will be avoided Outcome: Progressing   Problem: Nutrition: Goal: Adequate nutrition will be maintained Outcome: Progressing   Problem: Coping: Goal: Level of anxiety will decrease Outcome: Progressing   Problem: Elimination: Goal: Will not experience complications related to bowel motility Outcome: Progressing Goal: Will not experience complications related to urinary retention Outcome: Progressing

## 2022-08-14 NOTE — Progress Notes (Signed)
[facility] MD Progress Note  08/14/2022 4:52 PM Adam [name]  MRN:  [id number] Subjective: Follow-up 61 year old man with schizophrenia.  Patient has no new complaints.  Behavior has been calm.  No physical complaints.  Cooperative with treatment Principal Problem: Schizophrenia, chronic condition with acute exacerbation (HCC) Diagnosis: Principal Problem:   Schizophrenia, chronic condition with acute exacerbation (HCC) Active Problems:   Difficulty controlling behavior as late effect of traumatic brain injury (HCC)  Total Time spent with patient: 30 minutes  Past Psychiatric History: Past history of schizophrenia  Past Medical History:  Past Medical History:  Diagnosis Date   Myocardial infarction (HCC)    Schizophrenia (HCC)    Stroke (HCC)        History reviewed. No pertinent surgical history. Family History: History reviewed. No pertinent family history. Family Psychiatric  History: See previous Social History:  Social History   Substance and Sexual Activity  Alcohol Use Not Currently     Social History   Substance and Sexual Activity  Drug Use Not Currently    Social History   Socioeconomic History   Marital status: Single    Spouse name: Not on file   Number of children: Not on file   Years of education: Not on file   Highest education level: Not on file  Occupational History   Not on file  Tobacco Use   Smoking status: Never    Passive exposure: Never   Smokeless tobacco: Never  Vaping Use   Vaping Use: Unknown  Substance and Sexual Activity   Alcohol use: Not Currently   Drug use: Not Currently   Sexual activity: Not Currently  Other Topics Concern   Not on file  Social History Narrative   Not on file   Social Determinants of Health   Financial Resource Strain: Not on file  Food Insecurity: Not on file  Transportation Needs: Not on file  Physical Activity: Not on file  Stress: Not on file  Social Connections: Not on file   Additional Social  History:  Specify valuables returned:  (none)                      Sleep: Fair  Appetite:  Negative  Current Medications: Current Facility-Administered Medications  Medication Dose Route Frequency Provider Last Rate Last Admin   acetaminophen (TYLENOL) tablet 650 mg  650 mg Oral Q6H PRN Naome Brigandi T, MD   650 mg at 06/20/22 1351   alum & mag hydroxide-simeth (MAALOX/MYLANTA) 200-200-20 MG/5ML suspension 30 mL  30 mL Oral Q4H PRN Sedale Jenifer T, MD   30 mL at 04/26/22 3354   aspirin EC tablet 81 mg  81 mg Oral Daily Pattie Flaharty, [name], MD   81 mg at 08/14/22 1222   atorvastatin (LIPITOR) tablet 20 mg  20 mg Oral Daily Courtney Fenlon T, MD   20 mg at 08/14/22 1222   atropine 1 % ophthalmic solution 1 drop  1 drop Sublingual QID [name], MD   1 drop at 08/14/22 1222   benztropine (COGENTIN) tablet 0.5 mg  0.5 mg Oral BID Griffon Herberg T, MD   0.5 mg at 08/14/22 1222   clonazePAM (KLONOPIN) tablet 1 mg  1 mg Oral TID PRN He, [name], MD   1 mg at 08/12/22 2137   cloZAPine (CLOZARIL) tablet 300 mg  300 mg Oral QHS Savahanna Almendariz T, MD   300 mg at 08/13/22 2116   diphenhydrAMINE (BENADRYL) capsule 50 mg  50 mg Oral  Q6H PRN Delores Thelen, [name], MD   50 mg at 08/06/22 2119   Or   diphenhydrAMINE (BENADRYL) injection 50 mg  50 mg Intramuscular Q6H PRN Marvel Mcphillips T, MD       feeding supplement (ENSURE ENLIVE / ENSURE PLUS) liquid 237 mL  237 mL Oral TID BM Kela Baccari T, MD   237 mL at 08/14/22 1500   gabapentin (NEURONTIN) capsule 300 mg  300 mg Oral TID Elizer Bostic T, MD   300 mg at 08/14/22 1222   haloperidol (HALDOL) tablet 5 mg  5 mg Oral TID Lus Kriegel, [name], MD   5 mg at 08/14/22 1222   haloperidol (HALDOL) tablet 5 mg  5 mg Oral Q6H PRN Bryna Razavi T, MD   5 mg at 07/29/22 2102   Or   haloperidol lactate (HALDOL) injection 5 mg  5 mg Intramuscular Q6H PRN Kandee Escalante T, MD       lisinopril (ZESTRIL) tablet 5 mg  5 mg Oral Daily Amiere Cawley T, MD   5 mg at 08/14/22 1222    magnesium hydroxide (MILK OF MAGNESIA) suspension 30 mL  30 mL Oral Daily PRN Natally Ribera T, MD   30 mL at 05/02/22 4709   metoprolol succinate (TOPROL-XL) 24 hr tablet 25 mg  25 mg Oral Daily Trenae Brunke T, MD   25 mg at 08/14/22 1222   paliperidone (INVEGA SUSTENNA) injection 156 mg  156 mg Intramuscular Q28 days Cypher Paule T, MD   156 mg at 07/19/22 1029   temazepam (RESTORIL) capsule 15 mg  15 mg Oral QHS Ijanae Macapagal T, MD   15 mg at 08/13/22 2116   ziprasidone (GEODON) injection 20 mg  20 mg Intramuscular Q12H PRN Halo Laski, [name], MD   20 mg at 07/08/22 1453    Lab Results: No results found for this or any previous visit (from the past 48 hour(s)).  Blood Alcohol level:  Lab Results  Component Value Date   ETH <10 07/20/2021    Metabolic Disorder Labs: Lab Results  Component Value Date   HGBA1C 5.5 04/10/2022   MPG 111.15 04/10/2022   No results found for: "PROLACTIN" Lab Results  Component Value Date   CHOL 167 11/20/2021   TRIG 107 11/20/2021   HDL 26 (L) 11/20/2021   CHOLHDL 6.4 11/20/2021   VLDL 21 11/20/2021   LDLCALC 120 (H) 11/20/2021    Physical Findings: AIMS: Facial and Oral Movements Muscles of Facial Expression: None, normal Lips and Perioral Area: None, normal Jaw: None, normal Tongue: None, normal,Extremity Movements Upper (arms, wrists, hands, fingers): None, normal Lower (legs, knees, ankles, toes): None, normal, Trunk Movements Neck, shoulders, hips: None, normal, Overall Severity Severity of abnormal movements (highest score from questions above): None, normal Incapacitation due to abnormal movements: None, normal Patient's awareness of abnormal movements (rate only patient's report): No Awareness, Dental Status Current problems with teeth and/or dentures?: No Does patient usually wear dentures?: No  CIWA:    COWS:     Musculoskeletal: Strength & Muscle Tone: within normal limits Gait & Station: normal Patient leans:  N/A  Psychiatric Specialty Exam:  Presentation  General Appearance: Appropriate for Environment; Casual; Neat; Well Groomed  Eye Contact:Fair  Speech:Slow  Speech Volume:Decreased  Handedness:Left   Mood and Affect  Mood:Anxious  Affect:Congruent   Thought Process  Thought Processes:Goal Directed  Descriptions of Associations:Circumstantial  Orientation:Partial  Thought Content:Scattered  History of Schizophrenia/Schizoaffective disorder:Yes  Duration of Psychotic Symptoms:Greater than six months  Hallucinations:No data recorded Ideas of Reference:None  Suicidal Thoughts:No data recorded Homicidal Thoughts:No data recorded  Sensorium  Memory:Remote Good  Judgment:Fair  Insight:Fair   Executive Functions  Concentration:Fair  Attention Span:Fair  Recall:Fair  Fund of Knowledge:Fair  Language:Fair   Psychomotor Activity  Psychomotor Activity:No data recorded  Assets  Assets:No data recorded  Sleep  Sleep:No data recorded   Physical Exam: Physical Exam Vitals and nursing note reviewed.  Constitutional:      Appearance: Normal appearance.  HENT:     Head: Normocephalic and atraumatic.     Mouth/Throat:     Pharynx: Oropharynx is clear.  Eyes:     Pupils: Pupils are equal, round, and reactive to light.  Cardiovascular:     Rate and Rhythm: Normal rate and regular rhythm.  Pulmonary:     Effort: Pulmonary effort is normal.     Breath sounds: Normal breath sounds.  Abdominal:     General: Abdomen is flat.     Palpations: Abdomen is soft.  Musculoskeletal:        General: Normal range of motion.  Skin:    General: Skin is warm and dry.  Neurological:     General: No focal deficit present.     Mental Status: He is alert. Mental status is at baseline.  Psychiatric:        Mood and Affect: Mood normal.        Thought Content: Thought content normal.    Review of Systems  Unable to perform ROS: Language   Blood pressure  122/86, pulse 99, temperature 97.8 F (36.6 C), temperature source Oral, resp. rate 20, height 5\' 8"  (1.727 m), weight 85.8 kg, SpO2 100 %. Body mass index is 28.76 kg/m.   Treatment Plan Summary: Plan no change to medication or treatment plan continue to focus on attempts at getting him into a group home  , MD 08/14/2022, 4:52 PM

## 2022-08-14 NOTE — Plan of Care (Signed)
D- Patient alert and oriented to person and situation. Patient presented in a slightly agitated, preoccupied mood on assessment. Patient didn't wake up until around lunch time and came out of his room fixated on calling his mother. This Clinical research associate tried to explain to patient that he is not able to make any calls on the cell phone that he has been permitted to keep by the administrative staff, however, he doesn't comprehend. Patient has a past brain injury, and has expressive aphasia, in which he will answer questions with a "yes" or "no" and sometimes he repeats words he hears other say.  Patient said "no" when asked if he was depressed or anxious. Patient also said "no" when asked about SI, HI, AVH, and pain at this time. Patient had no stated goals for today.  A- Scheduled medications administered to patient, per MD orders. Support and encouragement provided.  Routine safety checks conducted every 15 minutes.  Patient informed to notify staff with problems or concerns.  R- No adverse drug reactions noted. Patient contracts for safety at this time. Patient compliant with medications. Patient receptive, calm, and cooperative after some redirection. Patient remains safe at this time.  Problem: Education: Goal: Ability to state activities that reduce stress will improve Outcome: Progressing   Problem: Coping: Goal: Ability to identify and develop effective coping behavior will improve Outcome: Progressing   Problem: Self-Concept: Goal: Ability to identify factors that promote anxiety will improve Outcome: Progressing Goal: Level of anxiety will decrease Outcome: Progressing Goal: Ability to modify response to factors that promote anxiety will improve Outcome: Progressing   Problem: Education: Goal: Knowledge of General Education information will improve Description: Including pain rating scale, medication(s)/side effects and non-pharmacologic comfort measures Outcome: Progressing   Problem:  Health Behavior/Discharge Planning: Goal: Ability to manage health-related needs will improve Outcome: Progressing   Problem: Clinical Measurements: Goal: Ability to maintain clinical measurements within normal limits will improve Outcome: Progressing Goal: Will remain free from infection Outcome: Progressing Goal: Diagnostic test results will improve Outcome: Progressing Goal: Respiratory complications will improve Outcome: Progressing Goal: Cardiovascular complication will be avoided Outcome: Progressing   Problem: Activity: Goal: Risk for activity intolerance will decrease Outcome: Progressing   Problem: Nutrition: Goal: Adequate nutrition will be maintained Outcome: Progressing   Problem: Coping: Goal: Level of anxiety will decrease Outcome: Progressing   Problem: Elimination: Goal: Will not experience complications related to bowel motility Outcome: Progressing Goal: Will not experience complications related to urinary retention Outcome: Progressing   Problem: Pain Managment: Goal: General experience of comfort will improve Outcome: Progressing   Problem: Safety: Goal: Ability to remain free from injury will improve Outcome: Progressing   Problem: Skin Integrity: Goal: Risk for impaired skin integrity will decrease Outcome: Progressing   Problem: Activity: Goal: Interest or engagement in leisure activities will improve Outcome: Progressing   Problem: Coping: Goal: Coping ability will improve Outcome: Progressing   Problem: Education: Goal: Will be free of psychotic symptoms Outcome: Progressing

## 2022-08-14 NOTE — [facility] Group Notes (Signed)
[facility] Group Notes:  (Nursing/MHT/Case Management/Adjunct)  Date:  08/14/2022  Time:  9:48 AM  Type of Therapy:   community meeting  Participation Level:  Did Not Attend    Rodena Goldmann 08/14/2022, 9:48 AM

## 2022-08-14 NOTE — Progress Notes (Signed)
Recreation Therapy Notes  Date: 08/14/2022  Time: 10:30 am   Location: Courtyard     Behavioral response: N/A   Intervention Topic: Wellness   Discussion/Intervention: Patient did not attend group.   Clinical Observations/Feedback:  Patient did not attend group.   Atticus Wedin LRT/CTRS         Raeqwon Lux 08/14/2022 1:00 PM

## 2022-08-14 NOTE — Progress Notes (Signed)
Patient is still sleeping at this time. This writer will administer scheduled morning medication once he wakes up. MD will be notified during progression rounds.

## 2022-08-14 NOTE — Progress Notes (Signed)
Received his qhs medication without incident.  Reports having an overall good day. Implied that he is eating and sleeping well. Will continue to monitor with q 15 minute safety checks.    C Butler-Nicholson, LPN

## 2022-08-14 NOTE — Progress Notes (Signed)
Patient at the nurses station with 2 pictures of puppies that someone has printed off for him.  Some agitation regarding inability to convey what he is trying to tell us.  Keep holding the pictures up  getting  more and more agitated.  Finally got went stomping off to his room.  Will continue to monitor and offer support and encouragement.    C Butler-Nicholson, LPN

## 2022-08-15 NOTE — Plan of Care (Signed)
Patient appropriate in the unit. No issues verbalized. Compliant with medications. Appetite and energy level good. Support and encouragement given.

## 2022-08-15 NOTE — [facility] Group Notes (Signed)
[facility] Group Notes:  (Nursing/MHT/Case Management/Adjunct)  Date:  08/15/2022  Time:  9:00 PM  Type of Therapy:   Wrap up  Participation Level:  Minimal  Participation Quality:  Redirectable  Affect:  Appropriate  Cognitive:  Alert  Insight:  Limited  Engagement in Group:  Off Topic  Modes of Intervention:  Support  Summary of Progress/Problems:  Adam [name] 08/15/2022, 9:00 PM

## 2022-08-15 NOTE — Plan of Care (Signed)
  Problem: Education: Goal: Ability to state activities that reduce stress will improve Outcome: Progressing   Problem: Coping: Goal: Ability to identify and develop effective coping behavior will improve Outcome: Progressing   Problem: Self-Concept: Goal: Ability to identify factors that promote anxiety will improve Outcome: Progressing Goal: Level of anxiety will decrease Outcome: Progressing Goal: Ability to modify response to factors that promote anxiety will improve Outcome: Progressing   Problem: Education: Goal: Knowledge of General Education information will improve Description: Including pain rating scale, medication(s)/side effects and non-pharmacologic comfort measures Outcome: Progressing   Problem: Education: Goal: Will be free of psychotic symptoms Outcome: Progressing   Problem: Coping: Goal: Coping ability will improve Outcome: Progressing   Problem: Activity: Goal: Interest or engagement in leisure activities will improve Outcome: Progressing   Problem: Skin Integrity: Goal: Risk for impaired skin integrity will decrease Outcome: Progressing   Problem: Safety: Goal: Ability to remain free from injury will improve Outcome: Progressing   Problem: Pain Managment: Goal: General experience of comfort will improve Outcome: Progressing   Problem: Elimination: Goal: Will not experience complications related to bowel motility Outcome: Progressing Goal: Will not experience complications related to urinary retention Outcome: Progressing   Problem: Coping: Goal: Level of anxiety will decrease Outcome: Progressing   Problem: Nutrition: Goal: Adequate nutrition will be maintained Outcome: Progressing   P

## 2022-08-15 NOTE — Progress Notes (Signed)
Recreation Therapy Notes    Date: 08/15/2022   Time: 10:30 am   Location: Craft room    Behavioral response: N/A   Intervention Topic: Stress Management    Discussion/Intervention: Patient refused to attend group.   Clinical Observations/Feedback:  Patient refused to attend group.   [place]lin [place] LRT/CTRS          Kahleb Mcclane 08/15/2022 11:21 AM        Kyran Whittier 08/15/2022 11:21 AM

## 2022-08-15 NOTE — Progress Notes (Signed)
[facility] MD Progress Note  08/15/2022 2:43 PM Adam [name]  MRN:  [id number] Subjective: Patient seen and chart reviewed.  As usual patient is focusing on a couple of words that he repeats during the day.  Not aggressive not threatening not hostile.  Affect euthymic.  Easily redirectable.  Taking care of basic hygiene adequately. Principal Problem: Schizophrenia, chronic condition with acute exacerbation (HCC) Diagnosis: Principal Problem:   Schizophrenia, chronic condition with acute exacerbation (HCC) Active Problems:   Difficulty controlling behavior as late effect of traumatic brain injury (HCC)  Total Time spent with patient: 30 minutes  Past Psychiatric History: Past history of schizophrenia.  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 No pertinent surgical history. Family History: History reviewed. No pertinent family history. Family Psychiatric  History: See previous Social History:  Social History   Substance and Sexual Activity  Alcohol Use Not Currently     Social History   Substance and Sexual Activity  Drug Use Not Currently    Social History   Socioeconomic History   Marital status: Single    Spouse name: Not on file   Number of children: Not on file   Years of education: Not on file   Highest education level: Not on file  Occupational History   Not on file  Tobacco Use   Smoking status: Never    Passive exposure: Never   Smokeless tobacco: Never  Vaping Use   Vaping Use: Unknown  Substance and Sexual Activity   Alcohol use: Not Currently   Drug use: Not Currently   Sexual activity: Not Currently  Other Topics Concern   Not on file  Social History Narrative   Not on file   Social Determinants of Health   Financial Resource Strain: Not on file  Food Insecurity:  Not on file  Transportation Needs: Not on file  Physical Activity: Not on file  Stress: Not on file  Social Connections: Not on file   Additional Social History:  Specify valuables returned:  (none)                      Sleep: Fair  Appetite:  Fair  Current Medications: Current Facility-Administered Medications  Medication Dose Route Frequency Provider Last Rate Last Admin   acetaminophen (TYLENOL) tablet 650 mg  650 mg Oral Q6H PRN Darinda [place]ute[place] T, MD   650 mg at 06/20/22 1351   alum & mag hydroxide-simeth (MAALOX/MYLANTA) 200-200-20 MG/5ML suspension 30 mL  30 mL Oral Q4H PRN Kvon Mcilhenny T, MD   30 mL at 04/26/22 2703   aspirin EC tablet 81 mg  81 mg Oral Daily Brett Soza, [name], MD   81 mg at 08/15/22 0851   atorvastatin (LIPITOR) tablet 20 mg  20 mg Oral Daily Kaavya Puskarich T, MD   20 mg at 08/15/22 0850   atropine 1 % ophthalmic solution 1 drop  1 drop Sublingual QID [name], MD   1 drop at 08/15/22 1256   benztropine (COGENTIN) tablet 0.5 mg  0.5 mg Oral BID Christoher Drudge T, MD   0.5 mg at 08/15/22 0851   clonazePAM (KLONOPIN) tablet 1 mg  1 mg Oral TID PRN He, [name], MD   1 mg at 08/12/22 2137   cloZAPine (  CLOZARIL) tablet 300 mg  300 mg Oral QHS Klark Vanderhoef T, MD   300 mg at 08/14/22 2125   diphenhydrAMINE (BENADRYL) capsule 50 mg  50 mg Oral Q6H PRN Clarivel Callaway, [name], MD   50 mg at 08/06/22 2119   Or   diphenhydrAMINE (BENADRYL) injection 50 mg  50 mg Intramuscular Q6H PRN Meika Earll T, MD       feeding supplement (ENSURE ENLIVE / ENSURE PLUS) liquid 237 mL  237 mL Oral TID BM Keni Wafer T, MD   237 mL at 08/15/22 1056   gabapentin (NEURONTIN) capsule 300 mg  300 mg Oral TID Miles Leyda T, MD   300 mg at 08/15/22 1256   haloperidol (HALDOL) tablet 5 mg  5 mg Oral TID Jasan Doughtie, [name], MD   5 mg at 08/15/22 1256   haloperidol (HALDOL) tablet 5 mg  5 mg Oral Q6H PRN Keiston Manley T, MD   5 mg at 07/29/22 2102   Or   haloperidol lactate (HALDOL) injection 5  mg  5 mg Intramuscular Q6H PRN Jerrian Mells T, MD       lisinopril (ZESTRIL) tablet 5 mg  5 mg Oral Daily Sabino Denning T, MD   5 mg at 08/15/22 0850   magnesium hydroxide (MILK OF MAGNESIA) suspension 30 mL  30 mL Oral Daily PRN Patrizia Paule T, MD   30 mL at 05/02/22 1423   metoprolol succinate (TOPROL-XL) 24 hr tablet 25 mg  25 mg Oral Daily Mayre Bury T, MD   25 mg at 08/15/22 0851   paliperidone (INVEGA SUSTENNA) injection 156 mg  156 mg Intramuscular Q28 days Nicholi Ghuman T, MD   156 mg at 07/19/22 1029   temazepam (RESTORIL) capsule 15 mg  15 mg Oral QHS Yulian Gosney T, MD   15 mg at 08/14/22 2126   ziprasidone (GEODON) injection 20 mg  20 mg Intramuscular Q12H PRN Jahmiya Guidotti, [name], MD   20 mg at 07/08/22 1453    Lab Results: No results found for this or any previous visit (from the past 48 hour(s)).  Blood Alcohol level:  Lab Results  Component Value Date   ETH <10 07/20/2021    Metabolic Disorder Labs: Lab Results  Component Value Date   HGBA1C 5.5 04/10/2022   MPG 111.15 04/10/2022   No results found for: "PROLACTIN" Lab Results  Component Value Date   CHOL 167 11/20/2021   TRIG 107 11/20/2021   HDL 26 (L) 11/20/2021   CHOLHDL 6.4 11/20/2021   VLDL 21 11/20/2021   LDLCALC 120 (H) 11/20/2021    Physical Findings: AIMS: Facial and Oral Movements Muscles of Facial Expression: None, normal Lips and Perioral Area: None, normal Jaw: None, normal Tongue: None, normal,Extremity Movements Upper (arms, wrists, hands, fingers): None, normal Lower (legs, knees, ankles, toes): None, normal, Trunk Movements Neck, shoulders, hips: None, normal, Overall Severity Severity of abnormal movements (highest score from questions above): None, normal Incapacitation due to abnormal movements: None, normal Patient's awareness of abnormal movements (rate only patient's report): No Awareness, Dental Status Current problems with teeth and/or dentures?: No Does patient usually wear  dentures?: No  CIWA:    COWS:     Musculoskeletal: Strength & Muscle Tone: within normal limits Gait & Station: normal Patient leans: N/A  Psychiatric Specialty Exam:  Presentation  General Appearance: Appropriate for Environment; Casual; Neat; Well Groomed  Eye Contact:Fair  Speech:Slow  Speech Volume:Decreased  Handedness:Left   Mood and Affect  Mood:Anxious  Affect:Congruent  Thought Process  Thought Processes:Goal Directed  Descriptions of Associations:Circumstantial  Orientation:Partial  Thought Content:Scattered  History of Schizophrenia/Schizoaffective disorder:Yes  Duration of Psychotic Symptoms:Greater than six months  Hallucinations:No data recorded Ideas of Reference:None  Suicidal Thoughts:No data recorded Homicidal Thoughts:No data recorded  Sensorium  Memory:Remote Good  Judgment:Fair  Insight:Fair   Executive Functions  Concentration:Fair  Attention Span:Fair  Recall:Fair  Fund of Knowledge:Fair  Language:Fair   Psychomotor Activity  Psychomotor Activity:No data recorded  Assets  Assets:No data recorded  Sleep  Sleep:No data recorded   Physical Exam: Physical Exam Vitals and nursing note reviewed.  Constitutional:      Appearance: Normal appearance.  HENT:     Head: Normocephalic and atraumatic.     Mouth/Throat:     Pharynx: Oropharynx is clear.  Eyes:     Pupils: Pupils are equal, round, and reactive to light.  Cardiovascular:     Rate and Rhythm: Normal rate and regular rhythm.  Pulmonary:     Effort: Pulmonary effort is normal.     Breath sounds: Normal breath sounds.  Abdominal:     General: Abdomen is flat.     Palpations: Abdomen is soft.  Musculoskeletal:        General: Normal range of motion.  Skin:    General: Skin is warm and dry.  Neurological:     General: No focal deficit present.     Mental Status: He is alert. Mental status is at baseline.  Psychiatric:        Attention and  Perception: He is inattentive.        Mood and Affect: Affect is blunt.        Speech: He is noncommunicative.    ROS Blood pressure (!) 138/94, pulse 88, temperature 97.9 F (36.6 C), temperature source Oral, resp. rate 20, height 5\' 8"  (1.727 m), weight 85.8 kg, SpO2 100 %. Body mass index is 28.76 kg/m.   Treatment Plan Summary: Medication management and Plan no change to treatment plan.  Stable on medicine.  No obvious side effects.  Continue to support involvement in groups and activities on the unit.  Primary need is for placement.  , MD 08/15/2022, 2:43 PM

## 2022-08-16 LAB — SARS CORONAVIRUS 2 BY RT PCR: SARS Coronavirus 2 by RT PCR: NEGATIVE

## 2022-08-16 NOTE — Progress Notes (Signed)
Pt denies SI/HI/AVH and verbally agrees to approach staff if these become apparent or before harming themselves/others. Pt complained on night shift of not feeling well, was having body aches and stuffy nose. Pt was also irritable this morning and given a PRN which helped for the morning. Pt has gotten more frustrated in the evening. Covid came back negative. Scheduled medications administered to pt, per MD orders. RN provided support and encouragement to pt. Q15 min safety checks implemented and continued. Pt safe on the unit. RN will continue to monitor and intervene as needed.   Problem: Education: Goal: Ability to state activities that reduce stress will improve Outcome: Progressing   Problem: Coping: Goal: Ability to identify and develop effective coping behavior will improve Outcome: Progressing   08/16/22 0830  Psych Admission Type (Psych Patients Only)  Admission Status Involuntary  Psychosocial Assessment  Patient Complaints None  Eye Contact Fair  Facial Expression Animated;Anxious  Affect Anxious;Irritable  Speech Aphasic  Interaction Childlike  Motor Activity Slow  Appearance/Hygiene Unremarkable  Behavior Characteristics Cooperative;Agitated;Anxious  Mood Anxious;Labile  Thought Process  Coherency Circumstantial  Content Preoccupation  Delusions None reported or observed  Perception WDL  Hallucination None reported or observed  Judgment Limited  Confusion None  Danger to Self  Current suicidal ideation? Denies  Danger to Others  Danger to Others None reported or observed  Danger to Others Abnormal  Harmful Behavior to others No threats or harm toward other people  Destructive Behavior No threats or harm toward property

## 2022-08-16 NOTE — Plan of Care (Signed)
Pt denies experiencing any anxiety/depression at this time. Pt denies experiencing any SI/HI/AVH or pain at this time. Pt compliant with medication. Pt provided with support and encouragement. Pt monitored q15 minutes for safety per unit policy. Plan of care ongoing.   Problem: Self-Concept: Goal: Level of anxiety will decrease Outcome: Progressing Goal: Ability to modify response to factors that promote anxiety will improve Outcome: Progressing

## 2022-08-16 NOTE — Progress Notes (Signed)
[facility] MD Progress Note  08/16/2022 3:18 PM Adam [name]  MRN:  [id number] Subjective: Follow-up patient with schizophrenia.  No new complaints no change in behavior no change in overall condition. Principal Problem: Schizophrenia, chronic condition with acute exacerbation (HCC) Diagnosis: Principal Problem:   Schizophrenia, chronic condition with acute exacerbation (HCC) Active Problems:   Difficulty controlling behavior as late effect of traumatic brain injury (HCC)  Total Time spent with patient: 20 minutes  Past Psychiatric History: Past history of schizophrenia.  Also has had a stroke.  Past Medical History:  Past Medical History:  Diagnosis Date   Myocardial infarction (HCC)    Schizophrenia (HCC)    Stroke (HCC)    TBI (traumatic brain injury) (HCC)    History reviewed. No pertinent surgical history. Family History: History reviewed. No pertinent family history. Family Psychiatric  History: None Social History:  Social History   Substance and Sexual Activity  Alcohol Use Not Currently     Social History   Substance and Sexual Activity  Drug Use Not Currently    Social History   Socioeconomic History   Marital status: Single    Spouse name: Not on file   Number of children: Not on file   Years of education: Not on file   Highest education level: Not on file  Occupational History   Not on file  Tobacco Use   Smoking status: Never    Passive exposure: Never   Smokeless tobacco: Never  Vaping Use   Vaping Use: Unknown  Substance and Sexual Activity   Alcohol use: Not Currently   Drug use: Not Currently   Sexual activity: Not Currently  Other Topics Concern   Not on file  Social History Narrative   Not on file   Social Determinants of Health   Financial Resource Strain: Not on file  Food Insecurity: Not on file  Transportation Needs: Not on file  Physical Activity: Not on file  Stress: Not on file  Social Connections: Not on file   Additional Social  History:  Specify valuables returned:  (none)                      Sleep: Fair  Appetite:  Fair  Current Medications: Current Facility-Administered Medications  Medication Dose Route Frequency Provider Last Rate Last Admin   acetaminophen (TYLENOL) tablet 650 mg  650 mg Oral Q6H PRN Serafina Topham T, MD   650 mg at 06/20/22 1351   alum & mag hydroxide-simeth (MAALOX/MYLANTA) 200-200-20 MG/5ML suspension 30 mL  30 mL Oral Q4H PRN Shakerria Parran T, MD   30 mL at 04/26/22 1696   aspirin EC tablet 81 mg  81 mg Oral Daily Lillion Elbert T, MD   81 mg at 08/16/22 0830   atorvastatin (LIPITOR) tablet 20 mg  20 mg Oral Daily Jamyia Fortune T, MD   20 mg at 08/16/22 0831   atropine 1 % ophthalmic solution 1 drop  1 drop Sublingual QID [name], MD   1 drop at 08/16/22 1147   benztropine (COGENTIN) tablet 0.5 mg  0.5 mg Oral BID Derita Michelsen T, MD   0.5 mg at 08/16/22 0830   clonazePAM (KLONOPIN) tablet 1 mg  1 mg Oral TID PRN He, [name], MD   1 mg at 08/12/22 2137   cloZAPine (CLOZARIL) tablet 300 mg  300 mg Oral QHS Anne-Marie Genson T, MD   300 mg at 08/15/22 2155   diphenhydrAMINE (BENADRYL) capsule 50 mg  50  mg Oral Q6H PRN Marysue Fait, [name], MD   50 mg at 08/16/22 0830   Or   diphenhydrAMINE (BENADRYL) injection 50 mg  50 mg Intramuscular Q6H PRN Tiran Sauseda T, MD       feeding supplement (ENSURE ENLIVE / ENSURE PLUS) liquid 237 mL  237 mL Oral TID BM Alquan Morrish T, MD   237 mL at 08/16/22 1337   gabapentin (NEURONTIN) capsule 300 mg  300 mg Oral TID Eliya Geiman T, MD   300 mg at 08/16/22 1146   haloperidol (HALDOL) tablet 5 mg  5 mg Oral TID Calbert Hulsebus, [name], MD   5 mg at 08/16/22 1146   haloperidol (HALDOL) tablet 5 mg  5 mg Oral Q6H PRN Lynell Kussman T, MD   5 mg at 07/29/22 2102   Or   haloperidol lactate (HALDOL) injection 5 mg  5 mg Intramuscular Q6H PRN Theran Vandergrift T, MD       lisinopril (ZESTRIL) tablet 5 mg  5 mg Oral Daily Maebell Lyvers T, MD   5 mg at 08/15/22 0850    magnesium hydroxide (MILK OF MAGNESIA) suspension 30 mL  30 mL Oral Daily PRN Samul Mcinroy T, MD   30 mL at 05/02/22 2956   metoprolol succinate (TOPROL-XL) 24 hr tablet 25 mg  25 mg Oral Daily Abbey Veith, [name], MD   25 mg at 08/15/22 0851   paliperidone (INVEGA SUSTENNA) injection 156 mg  156 mg Intramuscular Q28 days Yanitza Shvartsman, [name], MD   156 mg at 08/16/22 0833   temazepam (RESTORIL) capsule 15 mg  15 mg Oral QHS Gabrielle Mester T, MD   15 mg at 08/15/22 2156   ziprasidone (GEODON) injection 20 mg  20 mg Intramuscular Q12H PRN Javonn Gauger, [name], MD   20 mg at 07/08/22 1453    Lab Results:  Results for orders placed or performed during the hospital encounter of 03/19/22 (from the past 48 hour(s))  SARS Coronavirus 2 by RT PCR (hospital order, performed in [facility] hospital lab) *cepheid single result test* Anterior Nasal Swab     Status: None   Collection Time: 08/16/22 10:24 AM   Specimen: Anterior Nasal Swab  Result Value Ref Range   SARS Coronavirus 2 by RT PCR NEGATIVE NEGATIVE    Comment: (NOTE) SARS-CoV-2 target nucleic acids are NOT DETECTED.  The SARS-CoV-2 RNA is generally detectable in upper and lower respiratory specimens during the acute phase of infection. The lowest concentration of SARS-CoV-2 viral copies this assay can detect is 250 copies / mL. A negative result does not preclude SARS-CoV-2 infection and should not be used as the sole basis for treatment or other patient management decisions.  A negative result may occur with improper specimen collection / handling, submission of specimen other than nasopharyngeal swab, presence of viral mutation(s) within the areas targeted by this assay, and inadequate number of viral copies (<250 copies / mL). A negative result must be combined with clinical observations, patient history, and epidemiological information.  Fact Sheet for Patients:   RoadLapTop.co.za  Fact Sheet for Healthcare  Providers: http://kim-miller.com/  This test is not yet approved or  cleared by the [place] FDA and has been authorized for detection and/or diagnosis of SARS-CoV-2 by FDA under an Emergency Use Authorization (EUA).  This EUA will remain in effect (meaning this test can be used) for the duration of the COVID-19 declaration under Section 564(b)(1) of the Act, 21 U.S.C. section 360bbb-3(b)(1), unless the authorization is terminated  or revoked sooner.  Performed at [facility], [address] [place], [place] [address]     Blood Alcohol level:  Lab Results  Component Value Date   [facility] <10 07/20/2021    Metabolic Disorder Labs: Lab Results  Component Value Date   HGBA1C 5.5 04/10/2022   MPG 111.15 04/10/2022   No results found for: "PROLACTIN" Lab Results  Component Value Date   CHOL 167 11/20/2021   TRIG 107 11/20/2021   HDL 26 (L) 11/20/2021   CHOLHDL 6.4 11/20/2021   VLDL 21 11/20/2021   LDLCALC 120 (H) 11/20/2021    Physical Findings: AIMS: Facial and Oral Movements Muscles of Facial Expression: None, normal Lips and Perioral Area: None, normal Jaw: None, normal Tongue: None, normal,Extremity Movements Upper (arms, wrists, hands, fingers): None, normal Lower (legs, knees, ankles, toes): None, normal, Trunk Movements Neck, shoulders, hips: None, normal, Overall Severity Severity of abnormal movements (highest score from questions above): None, normal Incapacitation due to abnormal movements: None, normal Patient's awareness of abnormal movements (rate only patient's report): No Awareness, Dental Status Current problems with teeth and/or dentures?: No Does patient usually wear dentures?: No  CIWA:    COWS:     Musculoskeletal: Strength & Muscle Tone: within normal limits Gait & Station: normal Patient leans: N/A  Psychiatric Specialty Exam:  Presentation  General Appearance: Appropriate for Environment; Casual; Neat;  Well Groomed  Eye Contact:Fair  Speech:Slow  Speech Volume:Decreased  Handedness:Left   Mood and Affect  Mood:Anxious  Affect:Congruent   Thought Process  Thought Processes:Goal Directed  Descriptions of Associations:Circumstantial  Orientation:Partial  Thought Content:Scattered  History of Schizophrenia/Schizoaffective disorder:Yes  Duration of Psychotic Symptoms:Greater than six months  Hallucinations:No data recorded Ideas of Reference:None  Suicidal Thoughts:No data recorded Homicidal Thoughts:No data recorded  Sensorium  Memory:Remote Good  Judgment:Fair  Insight:Fair   Executive Functions  Concentration:Fair  Attention Span:Fair  Recall:Fair  Fund of Knowledge:Fair  Language:Fair   Psychomotor Activity  Psychomotor Activity:No data recorded  Assets  Assets:No data recorded  Sleep  Sleep:No data recorded   Physical Exam: Physical Exam Vitals reviewed.  Constitutional:      Appearance: Normal appearance.  HENT:     Head: Normocephalic and atraumatic.     Mouth/Throat:     Pharynx: Oropharynx is clear.  Eyes:     Pupils: Pupils are equal, round, and reactive to light.  Cardiovascular:     Rate and Rhythm: Normal rate and regular rhythm.  Pulmonary:     Effort: Pulmonary effort is normal.     Breath sounds: Normal breath sounds.  Abdominal:     General: Abdomen is flat.     Palpations: Abdomen is soft.  Musculoskeletal:        General: Normal range of motion.  Skin:    General: Skin is warm and dry.  Neurological:     General: No focal deficit present.     Mental Status: He is alert. Mental status is at baseline.  Psychiatric:        Mood and Affect: Mood normal.        Thought Content: Thought content normal.    Review of Systems  Constitutional: Negative.   HENT: Negative.    Eyes: Negative.   Respiratory: Negative.    Cardiovascular: Negative.   Gastrointestinal: Negative.   Musculoskeletal: Negative.    Skin: Negative.   Neurological: Negative.   Psychiatric/Behavioral: Negative.     Blood pressure (!) 89/65, pulse 93, temperature 97.9 F (36.6 C), temperature  source Oral, resp. rate 20, height 5\' 8"  (1.727 m), weight 85.8 kg, SpO2 100 %. Body mass index is 28.76 kg/m.   Treatment Plan Summary: Plan no change at all to treatment plan.  Continue to focus on discharge planning  , MD 08/16/2022, 3:18 PM

## 2022-08-16 NOTE — Progress Notes (Signed)
Recreation Therapy Notes   Date: 08/16/2022  Time: 10:10 am   Location: Courtyard     Behavioral response: N/A   Intervention Topic: Leisure   Discussion/Intervention: Patient refused to attend group.   Clinical Observations/Feedback:  Patient refused to attend group.   Deshon Hsiao LRT/CTRS         Kiauna Zywicki 08/16/2022 12:24 PM 

## 2022-08-16 NOTE — Progress Notes (Signed)
Pleasant and cooperative, he presents in a pleasant mood this evening.  He is active on the unit hanging in the dayroom with his peers watching TV.  He is med compliant and received his qhs meds without incident. He denies si  hi  avh depression and anxiety at this encounter. Will continue to monitor with q15 minute safety checks.   C Butler-Nicholson, LPN

## 2022-08-17 NOTE — Progress Notes (Signed)
Patient denies SI, HI, and AVH this shift. Patient has  been in his room singing loudly. Patient has had no incidents of behavioral dyscontrol this shift.   Assess patient for safety, offer medications as prescribed, engage patient with 1:1 staff talks.   Patient able to contract for safety. Continue to monitor as planned.

## 2022-08-17 NOTE — Plan of Care (Signed)
Pt denies experiencing any anxiety/depression/pain at this time. Pt denies experiencing any SI/HI/AVH at this time. Pt is calm and cooperative. Pt is compliant with medications. Pt is monitored q15 minutes for safety per unit policy. Pt given support and encouragement. Plan of care ongoing.   Problem: Self-Concept: Goal: Level of anxiety will decrease Outcome: Progressing   Problem: Self-Concept: Goal: Ability to modify response to factors that promote anxiety will improve Outcome: Not Progressing

## 2022-08-17 NOTE — Progress Notes (Signed)
[facility] MD Progress Note  08/17/2022 11:12 AM Adam [name]  MRN:  [id number] Subjective: Patient seen and chart reviewed.  He seems a little more out of sorts today.  He was crying in his room earlier but also has been up out of bed and interacting with others.  No behavior problems. Principal Problem: Schizophrenia, chronic condition with acute exacerbation ([place]) Diagnosis: Principal Problem:   Schizophrenia, chronic condition with acute exacerbation (HCC) Active Problems:    Total Time spent with patient: 30 minutes  Past Psychiatric History: Past history of schizophrenia.  Has also had a stroke.  Past Medical History:  Past Medical History:  Diagnosis Date   Myocardial infarction ([place])    Schizophrenia ([place])    Stroke [facility])        History reviewed. No pertinent surgical history. Family History: History reviewed. No pertinent family history. Family Psychiatric  History: None reported Social History:  Social History   Substance and Sexual Activity  Alcohol Use Not Currently     Social History   Substance and Sexual Activity  Drug Use Not Currently    Social History   Socioeconomic History   Marital status: Single    Spouse name: Not on file   Number of children: Not on file   Years of education: Not on file   Highest education level: Not on file  Occupational History   Not on file  Tobacco Use   Smoking status: Never    Passive exposure: Never   Smokeless tobacco: Never  Vaping Use   Vaping Use: Unknown  Substance and Sexual Activity   Alcohol use: Not Currently   Drug use: Not Currently   Sexual activity: Not Currently  Other Topics Concern   Not on file  Social History Narrative   Not on file   Social Determinants of Health   Financial Resource Strain: Not on file  Food Insecurity: Not on file  Transportation Needs: Not on file  Physical Activity: Not on file  Stress: Not on file  Social Connections: Not on file   Additional Social History:  Specify  valuables returned:  (none)                      Sleep: Fair  Appetite:  Fair  Current Medications: Current Facility-Administered Medications  Medication Dose Route Frequency Provider Last Rate Last Admin   acetaminophen (TYLENOL) tablet 650 mg  650 mg Oral Q6H PRN Daila Elbert T, MD   650 mg at 06/20/22 1351   alum & mag hydroxide-simeth (MAALOX/MYLANTA) 200-200-20 MG/5ML suspension 30 mL  30 mL Oral Q4H PRN Jaclene Bartelt T, MD   30 mL at 04/26/22 [id number]   aspirin EC tablet 81 mg  81 mg Oral Daily Shonique Pelphrey, [name], MD   81 mg at 08/17/22 [id number]   atorvastatin (LIPITOR) tablet 20 mg  20 mg Oral Daily Char Feltman T, MD   20 mg at 08/17/22 0807   atropine 1 % ophthalmic solution 1 drop  1 drop Sublingual QID [name], MD   1 drop at 08/17/22 0806   benztropine (COGENTIN) tablet 0.5 mg  0.5 mg Oral BID Hemi Chacko T, MD   0.5 mg at 08/17/22 [id number]   clonazePAM (KLONOPIN) tablet 1 mg  1 mg Oral TID PRN He, [name], MD   1 mg at 08/12/22 2137   cloZAPine (CLOZARIL) tablet 300 mg  300 mg Oral QHS Anelle Parlow, [name], MD   300 mg at 08/16/22 2126  diphenhydrAMINE (BENADRYL) capsule 50 mg  50 mg Oral Q6H PRN Rhea Kaelin T, MD   50 mg at 08/16/22 0830   Or   diphenhydrAMINE (BENADRYL) injection 50 mg  50 mg Intramuscular Q6H PRN Trinitey Roache T, MD       feeding supplement (ENSURE ENLIVE / ENSURE PLUS) liquid 237 mL  237 mL Oral TID BM Usha Slager T, MD   237 mL at 08/17/22 0943   gabapentin (NEURONTIN) capsule 300 mg  300 mg Oral TID Endiya Klahr, [name], MD   300 mg at 08/17/22 1751   haloperidol (HALDOL) tablet 5 mg  5 mg Oral TID Lido Maske, [name], MD   5 mg at 08/17/22 0258   haloperidol (HALDOL) tablet 5 mg  5 mg Oral Q6H PRN Nixie Laube T, MD   5 mg at 07/29/22 2102   Or   haloperidol lactate (HALDOL) injection 5 mg  5 mg Intramuscular Q6H PRN Vicci Reder T, MD       lisinopril (ZESTRIL) tablet 5 mg  5 mg Oral Daily Dacia Capers T, MD   5 mg at 08/15/22 0850   magnesium hydroxide  (MILK OF MAGNESIA) suspension 30 mL  30 mL Oral Daily PRN Ayson Cherubini T, MD   30 mL at 05/02/22 5277   metoprolol succinate (TOPROL-XL) 24 hr tablet 25 mg  25 mg Oral Daily Ronin Crager, [name], MD   25 mg at 08/15/22 0851   paliperidone (INVEGA SUSTENNA) injection 156 mg  156 mg Intramuscular Q28 days Lashondra Vaquerano, [name], MD   156 mg at 08/16/22 0833   temazepam (RESTORIL) capsule 15 mg  15 mg Oral QHS Wess Baney T, MD   15 mg at 08/16/22 2126   ziprasidone (GEODON) injection 20 mg  20 mg Intramuscular Q12H PRN Daquane Aguilar, [name], MD   20 mg at 07/08/22 1453    Lab Results:  Results for orders placed or performed during the hospital encounter of 03/19/22 (from the past 48 hour(s))  SARS Coronavirus 2 by RT PCR (hospital order, performed in [facility] hospital lab) *cepheid single result test* Anterior Nasal Swab     Status: None   Collection Time: 08/16/22 10:24 AM   Specimen: Anterior Nasal Swab  Result Value Ref Range   SARS Coronavirus 2 by RT PCR NEGATIVE NEGATIVE    Comment: (NOTE) SARS-CoV-2 target nucleic acids are NOT DETECTED.  The SARS-CoV-2 RNA is generally detectable in upper and lower respiratory specimens during the acute phase of infection. The lowest concentration of SARS-CoV-2 viral copies this assay can detect is 250 copies / mL. A negative result does not preclude SARS-CoV-2 infection and should not be used as the sole basis for treatment or other patient management decisions.  A negative result may occur with improper specimen collection / handling, submission of specimen other than nasopharyngeal swab, presence of viral mutation(s) within the areas targeted by this assay, and inadequate number of viral copies (<250 copies / mL). A negative result must be combined with clinical observations, patient history, and epidemiological information.  Fact Sheet for Patients:   RoadLapTop.co.za  Fact Sheet for Healthcare  Providers: http://kim-miller.com/  This test is not yet approved or  cleared by the [place] FDA and has been authorized for detection and/or diagnosis of SARS-CoV-2 by FDA under an Emergency Use Authorization (EUA).  This EUA will remain in effect (meaning this test can be used) for the duration of the COVID-19 declaration under Section 564(b)(1) of the Act, 21 U.S.C.  section 360bbb-3(b)(1), unless the authorization is terminated or revoked sooner.  Performed at [facility], [address] [place], [place] [address]     Blood Alcohol level:  Lab Results  Component Value Date   [facility] <10 [id number]    Metabolic Disorder Labs: Lab Results  Component Value Date   HGBA1C 5.5 04/10/2022   MPG 111.15 04/10/2022   No results found for: "PROLACTIN" Lab Results  Component Value Date   CHOL 167 11/20/2021   TRIG 107 11/20/2021   HDL 26 (L) 11/20/2021   CHOLHDL 6.4 11/20/2021   VLDL 21 11/20/2021   LDLCALC 120 (H) 11/20/2021    Physical Findings: AIMS: Facial and Oral Movements Muscles of Facial Expression: None, normal Lips and Perioral Area: None, normal Jaw: None, normal Tongue: None, normal,Extremity Movements Upper (arms, wrists, hands, fingers): None, normal Lower (legs, knees, ankles, toes): None, normal, Trunk Movements Neck, shoulders, hips: None, normal, Overall Severity Severity of abnormal movements (highest score from questions above): None, normal Incapacitation due to abnormal movements: None, normal Patient's awareness of abnormal movements (rate only patient's report): No Awareness, Dental Status Current problems with teeth and/or dentures?: No Does patient usually wear dentures?: No  CIWA:    COWS:     Musculoskeletal: Strength & Muscle Tone: within normal limits Gait & Station: normal Patient leans: N/A  Psychiatric Specialty Exam:  Presentation  General Appearance: Appropriate for Environment; Casual; Neat;  Well Groomed  Eye Contact:Fair  Speech:Slow  Speech Volume:Decreased  Handedness:Left   Mood and Affect  Mood:Anxious  Affect:Congruent   Thought Process  Thought Processes:Goal Directed  Descriptions of Associations:Circumstantial  Orientation:Partial  Thought Content:Scattered  History of Schizophrenia/Schizoaffective disorder:Yes  Duration of Psychotic Symptoms:Greater than six months  Hallucinations:No data recorded Ideas of Reference:None  Suicidal Thoughts:No data recorded Homicidal Thoughts:No data recorded  Sensorium  Memory:Remote Good  Judgment:Fair  Insight:Fair   Executive Functions  Concentration:Fair  Attention Span:Fair  [place]   Psychomotor Activity  Psychomotor Activity:No data recorded  Assets  Assets:No data recorded  Sleep  Sleep:No data recorded   Physical Exam: Physical Exam Vitals and nursing note reviewed.  Constitutional:      Appearance: Normal appearance.  HENT:     Head: Normocephalic and atraumatic.     Mouth/Throat:     Pharynx: Oropharynx is clear.  Eyes:     Pupils: Pupils are equal, round, and reactive to light.  Cardiovascular:     Rate and Rhythm: Normal rate and regular rhythm.  Pulmonary:     Effort: Pulmonary effort is normal.     Breath sounds: Normal breath sounds.  Abdominal:     General: Abdomen is flat.     Palpations: Abdomen is soft.  Musculoskeletal:        General: Normal range of motion.  Skin:    General: Skin is warm and dry.  Neurological:     General: No focal deficit present.     Mental Status: He is alert. Mental status is at baseline.  Psychiatric:        Attention and Perception: Attention normal.        Mood and Affect: Mood normal.        Speech: He is noncommunicative.    Review of Systems  Unable to perform ROS: Language   Blood pressure 105/65, pulse (!) 120, temperature 98.3 F (36.8 C), temperature source Oral,  resp. rate 20, height 5\' 8"  (1.727 m), weight 85.8 kg, SpO2 100 %.  Body mass index is 28.76 kg/m.   Treatment Plan Summary: Medication management and Plan no change to medication management.  Patient continues to be stable and not in need of this level of care.  Primary need is for placement.  Mordecai Rasmussen, MD 08/17/2022, 11:12 AM

## 2022-08-17 NOTE — Progress Notes (Signed)
Recreation Therapy Notes   Date: 08/17/2022  Time: 10:50 am   Location: Courtyard     Behavioral response: N/A   Intervention Topic: Social Skills   Discussion/Intervention: Patient refused to attend group.   Clinical Observations/Feedback:  Patient refused to attend group.   Treshawn Allen LRT/CTRS        Keonia Pasko 08/17/2022 1:06 PM

## 2022-08-18 LAB — SARS CORONAVIRUS 2 BY RT PCR: SARS Coronavirus 2 by RT PCR: NEGATIVE

## 2022-08-18 MED ORDER — ONDANSETRON HCL 4 MG PO TABS
4.0000 mg | ORAL_TABLET | Freq: Three times a day (TID) | ORAL | Status: DC | PRN
  Administered 2022-08-18 – 2022-09-06 (×2): 4 mg via ORAL
  Filled 2022-08-18 (×2): qty 1

## 2022-08-18 NOTE — Progress Notes (Signed)
[facility] MD Progress Note  08/18/2022 11:33 AM Adam [name]  MRN:  [id number] Subjective: Adam [name] is seen on rounds.  He has been in good controls.  He has been compliant with his medications.  No side effects.  He has no complaints and no issues.  Principal Problem: Schizophrenia, chronic condition with acute exacerbation (HCC) Diagnosis: Principal Problem:   Schizophrenia, chronic condition with acute exacerbation (HCC)  Total Time spent with patient: 15 minutes  Past Psychiatric History:  Past history of schizophrenia.  Has also had a stroke.  Past Medical History:  Past Medical History:  Diagnosis Date   Myocardial infarction (HCC)    Schizophrenia (HCC)    Stroke (HCC)    TBI (traumatic brain injury) (HCC)    History reviewed. No pertinent surgical history. Family History: History reviewed. No pertinent family history.  Social History:  Social History   Substance and Sexual Activity  Alcohol Use Not Currently     Social History   Substance and Sexual Activity  Drug Use Not Currently    Social History   Socioeconomic History   Marital status: Single    Spouse name: Not on file   Number of children: Not on file   Years of education: Not on file   Highest education level: Not on file  Occupational History   Not on file  Tobacco Use   Smoking status: Never    Passive exposure: Never   Smokeless tobacco: Never  Vaping Use   Vaping Use: Unknown  Substance and Sexual Activity   Alcohol use: Not Currently   Drug use: Not Currently   Sexual activity: Not Currently  Other Topics Concern   Not on file  Social History Narrative   Not on file   Social Determinants of Health   Financial Resource Strain: Not on file  Food Insecurity: Not on file  Transportation Needs: Not on file  Physical Activity: Not on file  Stress: Not on file  Social Connections: Not on file   Additional Social History:  Specify valuables returned:  (none)                       Sleep: Good  Appetite:  Good  Current Medications: Current Facility-Administered Medications  Medication Dose Route Frequency Provider Last Rate Last Admin   acetaminophen (TYLENOL) tablet 650 mg  650 mg Oral Q6H PRN [name], [name], MD   650 mg at 08/18/22 0028   alum & mag hydroxide-simeth (MAALOX/MYLANTA) 200-200-20 MG/5ML suspension 30 mL  30 mL Oral Q4H PRN [name], [name], MD   30 mL at 04/26/22 7858   aspirin EC tablet 81 mg  81 mg Oral Daily [name], [name], MD   81 mg at 08/18/22 0826   atorvastatin (LIPITOR) tablet 20 mg  20 mg Oral Daily [name], [name], MD   20 mg at 08/18/22 0826   atropine 1 % ophthalmic solution 1 drop  1 drop Sublingual QID [name], MD   1 drop at 08/18/22 0829   benztropine (COGENTIN) tablet 0.5 mg  0.5 mg Oral BID [name], [name], MD   0.5 mg at 08/18/22 0825   clonazePAM (KLONOPIN) tablet 1 mg  1 mg Oral TID PRN He, [name], MD   1 mg at 08/12/22 2137   cloZAPine (CLOZARIL) tablet 300 mg  300 mg Oral QHS [name], [name], MD   300 mg at 08/17/22 2116   diphenhydrAMINE (BENADRYL) capsule 50 mg  50 mg Oral  Q6H PRN [name], [name], MD   50 mg at 08/16/22 0830   Or   diphenhydrAMINE (BENADRYL) injection 50 mg  50 mg Intramuscular Q6H PRN [name], [name], MD       feeding supplement (ENSURE ENLIVE / ENSURE PLUS) liquid 237 mL  237 mL Oral TID BM [name], [name], MD   237 mL at 08/17/22 2001   gabapentin (NEURONTIN) capsule 300 mg  300 mg Oral TID [name], [name], MD   300 mg at 08/18/22 7829   haloperidol (HALDOL) tablet 5 mg  5 mg Oral TID [name], [name], MD   5 mg at 08/18/22 5621   haloperidol (HALDOL) tablet 5 mg  5 mg Oral Q6H PRN [name], [name], MD   5 mg at 07/29/22 2102   Or   haloperidol lactate (HALDOL) injection 5 mg  5 mg Intramuscular Q6H PRN [name], [name], MD       lisinopril (ZESTRIL) tablet 5 mg  5 mg Oral Daily [name], [name], MD   5 mg at 08/18/22 0826   magnesium hydroxide (MILK OF MAGNESIA) suspension 30 mL  30 mL Oral Daily PRN  [name], [name], MD   30 mL at 05/02/22 0627   metoprolol succinate (TOPROL-XL) 24 hr tablet 25 mg  25 mg Oral Daily [name], [name], MD   25 mg at 08/18/22 0826   ondansetron (ZOFRAN) tablet 4 mg  4 mg Oral Q8H PRN [name], NP   4 mg at 08/18/22 0052   paliperidone (INVEGA SUSTENNA) injection 156 mg  156 mg Intramuscular Q28 days [name], [name], MD   156 mg at 08/16/22 0833   temazepam (RESTORIL) capsule 15 mg  15 mg Oral QHS [name], [name], MD   15 mg at 08/17/22 2116   ziprasidone (GEODON) injection 20 mg  20 mg Intramuscular Q12H PRN [name], [name], MD   20 mg at 07/08/22 1453    Lab Results: No results found for this or any previous visit (from the past 48 hour(s)).  Blood Alcohol level:  Lab Results  Component Value Date   ETH <10 30/86/5784    Metabolic Disorder Labs: Lab Results  Component Value Date   HGBA1C 5.5 04/10/2022   MPG 111.15 04/10/2022   No results found for: "PROLACTIN" Lab Results  Component Value Date   CHOL 167 11/20/2021   TRIG 107 11/20/2021   HDL 26 (L) 11/20/2021   CHOLHDL 6.4 11/20/2021   VLDL 21 11/20/2021   LDLCALC 120 (H) 11/20/2021    Physical Findings: AIMS: Facial and Oral Movements Muscles of Facial Expression: None, normal Lips and Perioral Area: None, normal Jaw: None, normal Tongue: None, normal,Extremity Movements Upper (arms, wrists, hands, fingers): None, normal Lower (legs, knees, ankles, toes): None, normal, Trunk Movements Neck, shoulders, hips: None, normal, Overall Severity Severity of abnormal movements (highest score from questions above): None, normal Incapacitation due to abnormal movements: None, normal Patient's awareness of abnormal movements (rate only patient's report): No Awareness, Dental Status Current problems with teeth and/or dentures?: No Does patient usually wear dentures?: No  CIWA:    COWS:     Musculoskeletal: Strength & Muscle Tone: within normal limits Gait & Station: normal Patient  leans: N/A  Psychiatric Specialty Exam:  Presentation  General Appearance: Appropriate for Environment; Casual; Neat; Well Groomed  Eye Contact:Fair  Speech:Slow  Speech Volume:Decreased  Handedness:Left   Mood and Affect  Mood:Anxious  Affect:Congruent   Thought Process  Thought Processes:Goal  Directed  Descriptions of Associations:Circumstantial  Orientation:Partial  Thought Content:Scattered  History of Schizophrenia/Schizoaffective disorder:Yes  Duration of Psychotic Symptoms:Greater than six months  Hallucinations:No data recorded Ideas of Reference:None  Suicidal Thoughts:No data recorded Homicidal Thoughts:No data recorded  Sensorium  Memory:Remote Good  Judgment:Fair  Insight:Fair   Executive Functions  Concentration:Fair  Attention Span:Fair  Recall:Fair  Fund of Knowledge:Fair  Language:Fair   Psychomotor Activity  Psychomotor Activity:No data recorded  Assets  Assets:No data recorded  Sleep  Sleep:No data recorded   Blood pressure (!) 129/91, pulse (!) 116, temperature 98 F (36.7 C), temperature source Oral, resp. rate 20, height 5\' 8"  (1.727 m), weight 85.8 kg, SpO2 98 %. Body mass index is 28.76 kg/m.   Treatment Plan Summary: Daily contact with patient to assess and evaluate symptoms and progress in treatment, Medication management, and Plan continue current medications.  , DO 08/18/2022, 11:33 AM

## 2022-08-18 NOTE — Progress Notes (Signed)
Pt has been active on the unit this morning, ambulating around. He took his medications as ordered and ate his breakfast.  He denies any current anxiety/depression, also denies SI/HI/AVH. He denies any pain. He states his nausea from last night is now gone. He continues to have to be redirected due to him asking for [place] to call him different names. 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 Pt redirected and accepted redirection while smiling. Pt has been hollering out some in his room but no distress is noted. Will continue to provide verbal redirection and monitor.

## 2022-08-19 NOTE — Plan of Care (Signed)
D: Patient alert and oriented. Patient denies pain. Patient denies anxiety and depression. Patient denies SI/HI/AVH. Patient frequently observed walking around the unit and watching television in the dayroom. Patient has frequently came to the nurses station requesting that his name be changed on the name tag on patient door.  A: Scheduled medications administered to patient, per MD orders.  Support and encouragement provided to patient.  Q15 minute safety checks maintained.   R: Patient compliant with medication administration and treatment plan. No adverse drug reactions noted. Patient remains safe on the unit at this time.  Problem: Nutrition: Goal: Adequate nutrition will be maintained Outcome: Progressing   Problem: Clinical Measurements: Goal: Ability to maintain clinical measurements within normal limits will improve Outcome: Progressing   Problem: Education: Goal: Knowledge of General Education information will improve Description: Including pain rating scale, medication(s)/side effects and non-pharmacologic comfort measures Outcome: Progressing

## 2022-08-19 NOTE — Progress Notes (Signed)
Patient was cooperative with treatment and medication compliant on shift. No new behavioral issues to report on shift at this time.

## 2022-08-19 NOTE — Progress Notes (Signed)
[facility] MD Progress Note  08/19/2022 11:47 AM Adam [name]  MRN:  [id number] Subjective: Adam [name] was seen on rounds.  He states that he is doing fine when asked how he is doing.  No issues and no complaints.  Has been compliant with his medications and no evidence of any side effects.  Principal Problem: Schizophrenia, chronic condition with acute exacerbation (HCC) Diagnosis: Principal Problem:   Schizophrenia, chronic condition with acute exacerbation (HCC)  Total Time spent with patient: 15 minutes  Past Psychiatric History:   Past history of schizophrenia.  Has also had a stroke.  Past Medical History:  Past Medical History:  Diagnosis Date   Myocardial infarction (HCC)    Schizophrenia (HCC)    Stroke (HCC)    TBI (traumatic brain injury) (HCC)    History reviewed. No pertinent surgical history. Family History: History reviewed. No pertinent family history.  Social History:  Social History   Substance and Sexual Activity  Alcohol Use Not Currently     Social History   Substance and Sexual Activity  Drug Use Not Currently    Social History   Socioeconomic History   Marital status: Single    Spouse name: Not on file   Number of children: Not on file   Years of education: Not on file   Highest education level: Not on file  Occupational History   Not on file  Tobacco Use   Smoking status: Never    Passive exposure: Never   Smokeless tobacco: Never  Vaping Use   Vaping Use: Unknown  Substance and Sexual Activity   Alcohol use: Not Currently   Drug use: Not Currently   Sexual activity: Not Currently  Other Topics Concern   Not on file  Social History Narrative   Not on file   Social Determinants of Health   Financial Resource Strain: Not on file  Food Insecurity: Not on file  Transportation Needs: Not on file  Physical Activity: Not on file  Stress: Not on file  Social Connections: Not on file   Additional Social History:  Specify valuables returned:   (none)                      Sleep: Good  Appetite:  Good  Current Medications: Current Facility-Administered Medications  Medication Dose Route Frequency Provider Last Rate Last Admin   acetaminophen (TYLENOL) tablet 650 mg  650 mg Oral Q6H PRN [name], [name], MD   650 mg at 08/18/22 0028   alum & mag hydroxide-simeth (MAALOX/MYLANTA) 200-200-20 MG/5ML suspension 30 mL  30 mL Oral Q4H PRN [name], [name], MD   30 mL at 04/26/22 6440   aspirin EC tablet 81 mg  81 mg Oral Daily [name], [name], MD   81 mg at 08/19/22 0836   atorvastatin (LIPITOR) tablet 20 mg  20 mg Oral Daily [name], [name], MD   20 mg at 08/19/22 0836   atropine 1 % ophthalmic solution 1 drop  1 drop Sublingual QID [name], MD   1 drop at 08/19/22 0840   benztropine (COGENTIN) tablet 0.5 mg  0.5 mg Oral BID [name], [name], MD   0.5 mg at 08/19/22 0836   clonazePAM (KLONOPIN) tablet 1 mg  1 mg Oral TID PRN He, [name], MD   1 mg at 08/12/22 2137   cloZAPine (CLOZARIL) tablet 300 mg  300 mg Oral QHS [name], [name], MD   300 mg at 08/18/22 2130   diphenhydrAMINE (  BENADRYL) capsule 50 mg  50 mg Oral Q6H PRN [name], [name], MD   50 mg at 08/16/22 0830   Or   diphenhydrAMINE (BENADRYL) injection 50 mg  50 mg Intramuscular Q6H PRN [name], [name], MD       feeding supplement (ENSURE ENLIVE / ENSURE PLUS) liquid 237 mL  237 mL Oral TID BM [name], [name], MD   237 mL at 08/19/22 0954   gabapentin (NEURONTIN) capsule 300 mg  300 mg Oral TID [name], [name], MD   300 mg at 08/19/22 0836   haloperidol (HALDOL) tablet 5 mg  5 mg Oral TID [name], [name], MD   5 mg at 08/19/22 0836   haloperidol (HALDOL) tablet 5 mg  5 mg Oral Q6H PRN [name], [name], MD   5 mg at 07/29/22 2102   Or   haloperidol lactate (HALDOL) injection 5 mg  5 mg Intramuscular Q6H PRN [name], [name], MD       lisinopril (ZESTRIL) tablet 5 mg  5 mg Oral Daily [name], [name], MD   5 mg at 08/19/22 3818   magnesium hydroxide (MILK OF MAGNESIA)  suspension 30 mL  30 mL Oral Daily PRN [name], [name], MD   30 mL at 05/02/22 2993   metoprolol succinate (TOPROL-XL) 24 hr tablet 25 mg  25 mg Oral Daily [name], [name], MD   25 mg at 08/19/22 0836   ondansetron (ZOFRAN) tablet 4 mg  4 mg Oral Q8H PRN Jearld Lesch, NP   4 mg at 08/18/22 0052   paliperidone (INVEGA SUSTENNA) injection 156 mg  156 mg Intramuscular Q28 days [name], [name], MD   156 mg at 08/16/22 0833   temazepam (RESTORIL) capsule 15 mg  15 mg Oral QHS [name], [name], MD   15 mg at 08/18/22 2130   ziprasidone (GEODON) injection 20 mg  20 mg Intramuscular Q12H PRN [name], [name], MD   20 mg at 07/08/22 1453    Lab Results:  Results for orders placed or performed during the hospital encounter of 03/19/22 (from the past 48 hour(s))  SARS Coronavirus 2 by RT PCR (hospital order, performed in [facility] hospital lab) *cepheid single result test* Anterior Nasal Swab     Status: None   Collection Time: 08/18/22 12:20 PM   Specimen: Anterior Nasal Swab  Result Value Ref Range   SARS Coronavirus 2 by RT PCR NEGATIVE NEGATIVE    Comment: (NOTE) SARS-CoV-2 target nucleic acids are NOT DETECTED.  The SARS-CoV-2 RNA is generally detectable in upper and lower respiratory specimens during the acute phase of infection. The lowest concentration of SARS-CoV-2 viral copies this assay can detect is 250 copies / mL. A negative result does not preclude SARS-CoV-2 infection and should not be used as the sole basis for treatment or other patient management decisions.  A negative result may occur with improper specimen collection / handling, submission of specimen other than nasopharyngeal swab, presence of viral mutation(s) within the areas targeted by this assay, and inadequate number of viral copies (<250 copies / mL). A negative result must be combined with clinical observations, patient history, and epidemiological information.  Fact Sheet for Patients:    RoadLapTop.co.za  Fact Sheet for Healthcare Providers: http://kim-miller.com/  This test is not yet approved or  cleared by the [place] FDA and has been authorized for detection and/or diagnosis of SARS-CoV-2 by FDA under an Emergency Use Authorization (EUA).  This EUA will remain  in effect (meaning this test can be used) for the duration of the COVID-19 declaration under Section 564(b)(1) of the Act, 21 U.S.C. section 360bbb-3(b)(1), unless the authorization is terminated or revoked sooner.  Performed at [facility], [address] [place], [place] [address]     Blood Alcohol level:  Lab Results  Component Value Date   [facility] <10 65/78/4696    Metabolic Disorder Labs: Lab Results  Component Value Date   HGBA1C 5.5 04/10/2022   MPG 111.15 04/10/2022   No results found for: "PROLACTIN" Lab Results  Component Value Date   CHOL 167 11/20/2021   TRIG 107 11/20/2021   HDL 26 (L) 11/20/2021   CHOLHDL 6.4 11/20/2021   VLDL 21 11/20/2021   LDLCALC 120 (H) 11/20/2021    Physical Findings: AIMS: Facial and Oral Movements Muscles of Facial Expression: None, normal Lips and Perioral Area: None, normal Jaw: None, normal Tongue: None, normal,Extremity Movements Upper (arms, wrists, hands, fingers): None, normal Lower (legs, knees, ankles, toes): None, normal, Trunk Movements Neck, shoulders, hips: None, normal, Overall Severity Severity of abnormal movements (highest score from questions above): None, normal Incapacitation due to abnormal movements: None, normal Patient's awareness of abnormal movements (rate only patient's report): No Awareness, Dental Status Current problems with teeth and/or dentures?: No Does patient usually wear dentures?: No  CIWA:    COWS:     Musculoskeletal: Strength & Muscle Tone: within normal limits Gait & Station: normal Patient leans: N/A  Psychiatric Specialty  Exam:  Presentation  General Appearance: Appropriate for Environment; Casual; Neat; Well Groomed  Eye Contact:Fair  Speech:Slow  Speech Volume:Decreased  Handedness:Left   Mood and Affect  Mood:Anxious  Affect:Congruent   Thought Process  Thought Processes:Goal Directed  Descriptions of Associations:Circumstantial  Orientation:Partial  Thought Content:Scattered  History of Schizophrenia/Schizoaffective disorder:Yes  Duration of Psychotic Symptoms:Greater than six months  Hallucinations:No data recorded Ideas of Reference:None  Suicidal Thoughts:No data recorded Homicidal Thoughts:No data recorded  Sensorium  Memory:Remote Good  Judgment:Fair  Insight:Fair   Executive Functions  Concentration:Fair  Attention Span:Fair  [place]   Psychomotor Activity  Psychomotor Activity:No data recorded  Assets  Assets:No data recorded  Sleep  Sleep:No data recorded   Blood pressure 120/77, pulse (!) 107, temperature 98.1 F (36.7 C), temperature source Oral, resp. rate 20, height 5\' 8"  (1.727 m), weight 85.8 kg, SpO2 97 %. Body mass index is 28.76 kg/m.   Treatment Plan Summary: Daily contact with patient to assess and evaluate symptoms and progress in treatment, Medication management, and Plan continue current medications.  [name], DO 08/19/2022, 11:47 AM

## 2022-08-20 NOTE — Progress Notes (Signed)
Recreation Therapy Notes  Date: 08/20/2022  Time: 10:20 am   Location: Craft room    Behavioral response: N/A   Intervention Topic: Coping Skills    Discussion/Intervention: Patient refused to attend group.   Clinical Observations/Feedback:  Patient refused to attend group.   Jamiya Nims LRT/CTRS         [place] 08/20/2022 12:28 PM 

## 2022-08-20 NOTE — Plan of Care (Signed)
D: Patient alert and oriented. Patient denies pain. Patient denies anxiety and depression. Patient denies SI/HI/AVH.   A: Scheduled medications administered to patient, per MD orders. Support and encouragement provided to patient.  Q15 minute safety checks maintained.   R: Patient compliant with medication administration and treatment plan. No adverse drug reactions noted. Patient remains safe on the unit at this time.  Problem: Education: Goal: Knowledge of General Education information will improve Description: Including pain rating scale, medication(s)/side effects and non-pharmacologic comfort measures Outcome: Progressing   Problem: Clinical Measurements: Goal: Ability to maintain clinical measurements within normal limits will improve Outcome: Progressing   Problem: Safety: Goal: Ability to remain free from injury will improve Outcome: Progressing

## 2022-08-20 NOTE — Progress Notes (Signed)
[facility] MD Progress Note  08/20/2022 4:01 PM Adam [name]  MRN:  [id number] Subjective: No complaints no change to behavior Principal Problem: Schizophrenia, chronic condition with acute exacerbation (HCC) Diagnosis: Principal Problem:   Schizophrenia, chronic condition with acute exacerbation (HCC)  Total Time spent with patient: 20 minutes  Past Psychiatric History: Past history of schizophrenia  Past Medical History:  Past Medical History:  Diagnosis Date   Myocardial infarction (HCC)    Schizophrenia (HCC)    Stroke (HCC)    TBI (traumatic brain injury) (HCC)    History reviewed. No pertinent surgical history. Family History: History reviewed. No pertinent family history. Family Psychiatric  History: See previous Social History:  Social History   Substance and Sexual Activity  Alcohol Use Not Currently     Social History   Substance and Sexual Activity  Drug Use Not Currently    Social History   Socioeconomic History   Marital status: Single    Spouse name: Not on file   Number of children: Not on file   Years of education: Not on file   Highest education level: Not on file  Occupational History   Not on file  Tobacco Use   Smoking status: Never    Passive exposure: Never   Smokeless tobacco: Never  Vaping Use   Vaping Use: Unknown  Substance and Sexual Activity   Alcohol use: Not Currently   Drug use: Not Currently   Sexual activity: Not Currently  Other Topics Concern   Not on file  Social History Narrative   Not on file   Social Determinants of Health   Financial Resource Strain: Not on file  Food Insecurity: Not on file  Transportation Needs: Not on file  Physical Activity: Not on file  Stress: Not on file  Social Connections: Not on file   Additional Social History:  Specify valuables returned:  (none)                      Sleep: Fair  Appetite:  Fair  Current Medications: Current Facility-Administered Medications  Medication  Dose Route Frequency Provider Last Rate Last Admin   acetaminophen (TYLENOL) tablet 650 mg  650 mg Oral Q6H PRN Dimitriy Carreras T, MD   650 mg at 08/18/22 0028   alum & mag hydroxide-simeth (MAALOX/MYLANTA) 200-200-20 MG/5ML suspension 30 mL  30 mL Oral Q4H PRN Addylynn Balin T, MD   30 mL at 04/26/22 3568   aspirin EC tablet 81 mg  81 mg Oral Daily Tamaya Pun T, MD   81 mg at 08/20/22 1013   atorvastatin (LIPITOR) tablet 20 mg  20 mg Oral Daily Amariyana Heacox T, MD   20 mg at 08/20/22 1013   atropine 1 % ophthalmic solution 1 drop  1 drop Sublingual QID [name], MD   1 drop at 08/20/22 1416   benztropine (COGENTIN) tablet 0.5 mg  0.5 mg Oral BID Ciji Boston T, MD   0.5 mg at 08/20/22 1014   clonazePAM (KLONOPIN) tablet 1 mg  1 mg Oral TID PRN He, [name], MD   1 mg at 08/12/22 2137   cloZAPine (CLOZARIL) tablet 300 mg  300 mg Oral QHS Tycen Dockter T, MD   300 mg at 08/19/22 2152   diphenhydrAMINE (BENADRYL) capsule 50 mg  50 mg Oral Q6H PRN Chaunice Obie T, MD   50 mg at 08/16/22 0830   Or   diphenhydrAMINE (BENADRYL) injection 50 mg  50 mg Intramuscular Q6H PRN  Killian Schwer, [name], MD       feeding supplement (ENSURE ENLIVE / ENSURE PLUS) liquid 237 mL  237 mL Oral TID BM Floye Fesler T, MD   237 mL at 08/19/22 1431   gabapentin (NEURONTIN) capsule 300 mg  300 mg Oral TID Marlos Carmen T, MD   300 mg at 08/20/22 1414   haloperidol (HALDOL) tablet 5 mg  5 mg Oral TID Kedarius Aloisi, [name], MD   5 mg at 08/20/22 1414   haloperidol (HALDOL) tablet 5 mg  5 mg Oral Q6H PRN Temitayo Covalt T, MD   5 mg at 07/29/22 2102   Or   haloperidol lactate (HALDOL) injection 5 mg  5 mg Intramuscular Q6H PRN Helyn Schwan T, MD       lisinopril (ZESTRIL) tablet 5 mg  5 mg Oral Daily Shayli Altemose T, MD   5 mg at 08/20/22 1014   magnesium hydroxide (MILK OF MAGNESIA) suspension 30 mL  30 mL Oral Daily PRN Julina Altmann T, MD   30 mL at 05/02/22 0627   metoprolol succinate (TOPROL-XL) 24 hr tablet 25 mg  25 mg Oral Daily  Marieke Lubke T, MD   25 mg at 08/20/22 1013   ondansetron (ZOFRAN) tablet 4 mg  4 mg Oral Q8H PRN [name], NP   4 mg at 08/18/22 0052   paliperidone (INVEGA SUSTENNA) injection 156 mg  156 mg Intramuscular Q28 days Anona Giovannini, [name], MD   156 mg at 08/16/22 0833   temazepam (RESTORIL) capsule 15 mg  15 mg Oral QHS Keyana Guevara T, MD   15 mg at 08/19/22 2152   ziprasidone (GEODON) injection 20 mg  20 mg Intramuscular Q12H PRN Brittania Sudbeck, [name], MD   20 mg at 07/08/22 1453    Lab Results: No results found for this or any previous visit (from the past 48 hour(s)).  Blood Alcohol level:  Lab Results  Component Value Date   ETH <10 53/61/4431    Metabolic Disorder Labs: Lab Results  Component Value Date   HGBA1C 5.5 04/10/2022   MPG 111.15 04/10/2022   No results found for: "PROLACTIN" Lab Results  Component Value Date   CHOL 167 11/20/2021   TRIG 107 11/20/2021   HDL 26 (L) 11/20/2021   CHOLHDL 6.4 11/20/2021   VLDL 21 11/20/2021   LDLCALC 120 (H) 11/20/2021    Physical Findings: AIMS: Facial and Oral Movements Muscles of Facial Expression: None, normal Lips and Perioral Area: None, normal Jaw: None, normal Tongue: None, normal,Extremity Movements Upper (arms, wrists, hands, fingers): None, normal Lower (legs, knees, ankles, toes): None, normal, Trunk Movements Neck, shoulders, hips: None, normal, Overall Severity Severity of abnormal movements (highest score from questions above): None, normal Incapacitation due to abnormal movements: None, normal Patient's awareness of abnormal movements (rate only patient's report): No Awareness, Dental Status Current problems with teeth and/or dentures?: No Does patient usually wear dentures?: No  CIWA:    COWS:     Musculoskeletal: Strength & Muscle Tone: within normal limits Gait & Station: normal Patient leans: N/A  Psychiatric Specialty Exam:  Presentation  General Appearance: Appropriate for Environment; Casual;  Neat; Well Groomed  Eye Contact:Fair  Speech:Slow  Speech Volume:Decreased  Handedness:Left   Mood and Affect  Mood:Anxious  Affect:Congruent   Thought Process  Thought Processes:Goal Directed  Descriptions of Associations:Circumstantial  Orientation:Partial  Thought Content:Scattered  History of Schizophrenia/Schizoaffective disorder:Yes  Duration of Psychotic Symptoms:Greater than six months  Hallucinations:No data recorded Ideas of  Reference:None  Suicidal Thoughts:No data recorded Homicidal Thoughts:No data recorded  Sensorium  Memory:Remote Good  Judgment:Fair  Insight:Fair   Executive Functions  Concentration:Fair  Attention Span:Fair  Recall:Fair  Fund of Knowledge:Fair  Language:Fair   Psychomotor Activity  Psychomotor Activity:No data recorded  Assets  Assets:No data recorded  Sleep  Sleep:No data recorded   Physical Exam: Physical Exam Vitals and nursing note reviewed.  Constitutional:      Appearance: Normal appearance.  HENT:     Head: Normocephalic and atraumatic.     Mouth/Throat:     Pharynx: Oropharynx is clear.  Eyes:     Pupils: Pupils are equal, round, and reactive to light.  Cardiovascular:     Rate and Rhythm: Normal rate and regular rhythm.  Pulmonary:     Effort: Pulmonary effort is normal.     Breath sounds: Normal breath sounds.  Abdominal:     General: Abdomen is flat.     Palpations: Abdomen is soft.  Musculoskeletal:        General: Normal range of motion.  Skin:    General: Skin is warm and dry.  Neurological:     General: No focal deficit present.     Mental Status: He is alert. Mental status is at baseline.  Psychiatric:        Attention and Perception: Attention normal.        Mood and Affect: Mood normal.        Speech: He is noncommunicative.    Review of Systems  Unable to perform ROS: Language   Blood pressure 115/89, pulse (!) 105, temperature 99.2 F (37.3 C), temperature source  Oral, resp. rate 20, height 5\' 8"  (1.727 m), weight 85.8 kg, SpO2 98 %. Body mass index is 28.76 kg/m.   Treatment Plan Summary: Plan no change to treatment plan.  Still looking for placement  , MD 08/20/2022, 4:01 PM

## 2022-08-21 LAB — CBC WITH DIFFERENTIAL/PLATELET
Abs Immature Granulocytes: 0.01 10*3/uL (ref 0.00–0.07)
Basophils Absolute: 0.1 10*3/uL (ref 0.0–0.1)
Basophils Relative: 2 %
Eosinophils Absolute: 0 10*3/uL (ref 0.0–0.5)
Eosinophils Relative: 0 %
HCT: 39.5 % (ref 39.0–52.0)
Hemoglobin: 13.6 g/dL (ref 13.0–17.0)
Immature Granulocytes: 0 %
Lymphocytes Relative: 30 %
Lymphs Abs: 1.3 10*3/uL (ref 0.7–4.0)
MCH: 30.3 pg (ref 26.0–34.0)
MCHC: 34.4 g/dL (ref 30.0–36.0)
MCV: 88 fL (ref 80.0–100.0)
Monocytes Absolute: 0.5 10*3/uL (ref 0.1–1.0)
Monocytes Relative: 11 %
Neutro Abs: 2.4 10*3/uL (ref 1.7–7.7)
Neutrophils Relative %: 57 %
Platelets: 160 10*3/uL (ref 150–400)
RBC: 4.49 MIL/uL (ref 4.22–5.81)
RDW: 14.3 % (ref 11.5–15.5)
WBC: 4.3 10*3/uL (ref 4.0–10.5)
nRBC: 0 % (ref 0.0–0.2)

## 2022-08-21 LAB — SARS CORONAVIRUS 2 BY RT PCR: SARS Coronavirus 2 by RT PCR: NEGATIVE

## 2022-08-21 NOTE — Progress Notes (Signed)
[facility] MD Progress Note  08/21/2022 10:59 AM Adam [name]  MRN:  [id number] Subjective: Follow-up 61 year old man with schizophrenia.  No new complaints.  Still very limited verbally.  Vocal at times but not physically aggressive or threatening Principal Problem: Schizophrenia, chronic condition with acute exacerbation (HCC) Diagnosis: Principal Problem:   Schizophrenia, chronic condition with acute exacerbation (HCC)  Total Time spent with patient: 15 minutes  Past Psychiatric History: Schizophrenia  Past Medical History:  Past Medical History:  Diagnosis Date   Myocardial infarction (HCC)    Schizophrenia (HCC)    Stroke (HCC)    TBI (traumatic brain injury) (HCC)    History reviewed. No pertinent surgical history. Family History: History reviewed. No pertinent family history. Family Psychiatric  History: None Social History:  Social History   Substance and Sexual Activity  Alcohol Use Not Currently     Social History   Substance and Sexual Activity  Drug Use Not Currently    Social History   Socioeconomic History   Marital status: Single    Spouse name: Not on file   Number of children: Not on file   Years of education: Not on file   Highest education level: Not on file  Occupational History   Not on file  Tobacco Use   Smoking status: Never    Passive exposure: Never   Smokeless tobacco: Never  Vaping Use   Vaping Use: Unknown  Substance and Sexual Activity   Alcohol use: Not Currently   Drug use: Not Currently   Sexual activity: Not Currently  Other Topics Concern   Not on file  Social History Narrative   Not on file   Social Determinants of Health   Financial Resource Strain: Not on file  Food Insecurity: Not on file  Transportation Needs: Not on file  Physical Activity: Not on file  Stress: Not on file  Social Connections: Not on file   Additional Social History:  Specify valuables returned:  (none)                      Sleep:  Fair  Appetite:  Fair  Current Medications: Current Facility-Administered Medications  Medication Dose Route Frequency Provider Last Rate Last Admin   acetaminophen (TYLENOL) tablet 650 mg  650 mg Oral Q6H PRN Demon Volante T, MD   650 mg at 08/18/22 0028   alum & mag hydroxide-simeth (MAALOX/MYLANTA) 200-200-20 MG/5ML suspension 30 mL  30 mL Oral Q4H PRN Samuel Mcpeek T, MD   30 mL at 04/26/22 4098   aspirin EC tablet 81 mg  81 mg Oral Daily Chrystie Hagwood, [name], MD   81 mg at 08/21/22 0839   atorvastatin (LIPITOR) tablet 20 mg  20 mg Oral Daily Renato Spellman T, MD   20 mg at 08/21/22 0838   atropine 1 % ophthalmic solution 1 drop  1 drop Sublingual QID [name], MD   1 drop at 08/21/22 0840   benztropine (COGENTIN) tablet 0.5 mg  0.5 mg Oral BID Hayze Gazda T, MD   0.5 mg at 08/21/22 0838   clonazePAM (KLONOPIN) tablet 1 mg  1 mg Oral TID PRN He, [name], MD   1 mg at 08/21/22 0013   cloZAPine (CLOZARIL) tablet 300 mg  300 mg Oral QHS Shiya Fogelman T, MD   300 mg at 08/20/22 2122   diphenhydrAMINE (BENADRYL) capsule 50 mg  50 mg Oral Q6H PRN Neiman Roots, [name], MD   50 mg at 08/21/22 0012  Or   diphenhydrAMINE (BENADRYL) injection 50 mg  50 mg Intramuscular Q6H PRN Cristofer Yaffe, [name], MD       feeding supplement (ENSURE ENLIVE / ENSURE PLUS) liquid 237 mL  237 mL Oral TID BM Harmonee Tozer T, MD   237 mL at 08/21/22 1012   gabapentin (NEURONTIN) capsule 300 mg  300 mg Oral TID Sunny Gains, [name], MD   300 mg at 08/21/22 5277   haloperidol (HALDOL) tablet 5 mg  5 mg Oral TID Ganon Demasi, [name], MD   5 mg at 08/21/22 0839   haloperidol (HALDOL) tablet 5 mg  5 mg Oral Q6H PRN Inessa Wardrop, [name], MD   5 mg at 08/21/22 0013   Or   haloperidol lactate (HALDOL) injection 5 mg  5 mg Intramuscular Q6H PRN Braelin Costlow, [name], MD       lisinopril (ZESTRIL) tablet 5 mg  5 mg Oral Daily Merve Hotard, [name], MD   5 mg at 08/21/22 0839   magnesium hydroxide (MILK OF MAGNESIA) suspension 30 mL  30 mL Oral Daily PRN Meghan Warshawsky  T, MD   30 mL at 05/02/22 8242   metoprolol succinate (TOPROL-XL) 24 hr tablet 25 mg  25 mg Oral Daily Sherrol Vicars T, MD   25 mg at 08/21/22 0839   ondansetron (ZOFRAN) tablet 4 mg  4 mg Oral Q8H PRN Jearld Lesch, NP   4 mg at 08/18/22 0052   paliperidone (INVEGA SUSTENNA) injection 156 mg  156 mg Intramuscular Q28 days Henri Guedes T, MD   156 mg at 08/16/22 0833   temazepam (RESTORIL) capsule 15 mg  15 mg Oral QHS Chai Routh T, MD   15 mg at 08/20/22 2122   ziprasidone (GEODON) injection 20 mg  20 mg Intramuscular Q12H PRN Dylen Mcelhannon, [name], MD   20 mg at 07/08/22 1453    Lab Results:  Results for orders placed or performed during the hospital encounter of 03/19/22 (from the past 48 hour(s))  CBC with Differential/Platelet     Status: None   Collection Time: 08/21/22  9:20 AM  Result Value Ref Range   WBC 4.3 4.0 - 10.5 K/uL   RBC 4.49 4.22 - 5.81 MIL/uL   Hemoglobin 13.6 13.0 - 17.0 g/dL   HCT 35.3 61.4 - 43.1 %   MCV 88.0 80.0 - 100.0 fL   MCH 30.3 26.0 - 34.0 pg   MCHC 34.4 30.0 - 36.0 g/dL   RDW 54.0 08.6 - 76.1 %   Platelets 160 150 - 400 K/uL   nRBC 0.0 0.0 - 0.2 %   Neutrophils Relative % 57 %   Neutro Abs 2.4 1.7 - 7.7 K/uL   Lymphocytes Relative 30 %   Lymphs Abs 1.3 0.7 - 4.0 K/uL   Monocytes Relative 11 %   Monocytes Absolute 0.5 0.1 - 1.0 K/uL   Eosinophils Relative 0 %   Eosinophils Absolute 0.0 0.0 - 0.5 K/uL   Basophils Relative 2 %   Basophils Absolute 0.1 0.0 - 0.1 K/uL   Immature Granulocytes 0 %   Abs Immature Granulocytes 0.01 0.00 - 0.07 K/uL    Comment: Performed at [facility], [address] [place], [place] [address]    Blood Alcohol level:  Lab Results  Component Value Date   [facility] <10 07/20/2021    Metabolic Disorder Labs: Lab Results  Component Value Date   HGBA1C 5.5 04/10/2022   MPG 111.15 04/10/2022   No results found for: "PROLACTIN"  Lab Results  Component Value Date   CHOL 167 11/20/2021   TRIG 107 11/20/2021    HDL 26 (L) 11/20/2021   CHOLHDL 6.4 11/20/2021   VLDL 21 11/20/2021   LDLCALC 120 (H) 11/20/2021    Physical Findings: AIMS: Facial and Oral Movements Muscles of Facial Expression: None, normal Lips and Perioral Area: None, normal Jaw: None, normal Tongue: None, normal,Extremity Movements Upper (arms, wrists, hands, fingers): None, normal Lower (legs, knees, ankles, toes): None, normal, Trunk Movements Neck, shoulders, hips: None, normal, Overall Severity Severity of abnormal movements (highest score from questions above): None, normal Incapacitation due to abnormal movements: None, normal Patient's awareness of abnormal movements (rate only patient's report): No Awareness, Dental Status Current problems with teeth and/or dentures?: No Does patient usually wear dentures?: No  CIWA:    COWS:     Musculoskeletal: Strength & Muscle Tone: within normal limits Gait & Station: normal Patient leans: N/A  Psychiatric Specialty Exam:  Presentation  General Appearance: Appropriate for Environment; Casual; Neat; Well Groomed  Eye Contact:Fair  Speech:Slow  Speech Volume:Decreased  Handedness:Left   Mood and Affect  Mood:Anxious  Affect:Congruent   Thought Process  Thought Processes:Goal Directed  Descriptions of Associations:Circumstantial  Orientation:Partial  Thought Content:Scattered  History of Schizophrenia/Schizoaffective disorder:Yes  Duration of Psychotic Symptoms:Greater than six months  Hallucinations:No data recorded Ideas of Reference:None  Suicidal Thoughts:No data recorded Homicidal Thoughts:No data recorded  Sensorium  Memory:Remote Good  Judgment:Fair  Insight:Fair   Executive Functions  Concentration:Fair  Attention Span:Fair  [place]   Psychomotor Activity  Psychomotor Activity:No data recorded  Assets  Assets:No data recorded  Sleep  Sleep:No data recorded   Physical  Exam: Physical Exam Vitals and nursing note reviewed.  Constitutional:      Appearance: Normal appearance.  HENT:     Head: Normocephalic and atraumatic.     Mouth/Throat:     Pharynx: Oropharynx is clear.  Eyes:     Pupils: Pupils are equal, round, and reactive to light.  Cardiovascular:     Rate and Rhythm: Normal rate and regular rhythm.  Pulmonary:     Effort: Pulmonary effort is normal.     Breath sounds: Normal breath sounds.  Abdominal:     General: Abdomen is flat.     Palpations: Abdomen is soft.  Musculoskeletal:        General: Normal range of motion.  Skin:    General: Skin is warm and dry.  Neurological:     General: No focal deficit present.     Mental Status: He is alert. Mental status is at baseline.  Psychiatric:        Attention and Perception: He is inattentive.        Mood and Affect: Mood normal. Affect is blunt.        Speech: He is noncommunicative.    Review of Systems  Unable to perform ROS: Language   Blood pressure (!) 123/90, pulse (!) 105, temperature 97.9 F (36.6 C), temperature source Oral, resp. rate 20, height 5\' 8"  (1.727 m), weight 85.8 kg, SpO2 96 %. Body mass index is 28.76 kg/m.   Treatment Plan Summary: Plan stable with no need for change in medicine or change in treatment plan.  Needs a place to live.  Alethia Berthold, MD 08/21/2022, 10:59 AM

## 2022-08-21 NOTE — Progress Notes (Signed)
Patient observed yelling and reported anxiety and agitation. Patient asked for medications to help him calm down. Patient received haldol, benadryl and clonopin. Medications were effective.Patient resting quietly in bed with eyes closed, Respirations equal and unlabored, skin warm and dry, NAD. Routine safety checks conducted according to facility protocol. Will continue to monitor for safety.

## 2022-08-21 NOTE — Progress Notes (Signed)
Recreation Therapy Notes   Date: 08/21/2022  Time: 10:05 am   Location: Courtyard       Behavioral response: N/A   Intervention Topic: Wellness    Discussion/Intervention: Patient refused to attend group.   Clinical Observations/Feedback:  Patient refused to attend group.    Nyaja Dubuque LRT/CTRS        Tessia Kassin 08/21/2022 12:11 PM

## 2022-08-21 NOTE — Plan of Care (Signed)
  Problem: Self-Concept: Goal: Ability to identify factors that promote anxiety will improve Outcome: Progressing Goal: Level of anxiety will decrease Outcome: Progressing Goal: Ability to modify response to factors that promote anxiety will improve Outcome: Progressing   Problem: Education: Goal: Knowledge of General Education information will improve Description: Including pain rating scale, medication(s)/side effects and non-pharmacologic comfort measures Outcome: Progressing   Problem: Clinical Measurements: Goal: Ability to maintain clinical measurements within normal limits will improve Outcome: Progressing Goal: Will remain free from infection Outcome: Progressing Goal: Diagnostic test results will improve Outcome: Progressing Goal: Respiratory complications will improve Outcome: Progressing Goal: Cardiovascular complication will be avoided Outcome: Progressing   Problem: Nutrition: Goal: Adequate nutrition will be maintained Outcome: Progressing   Problem: Coping: Goal: Level of anxiety will decrease Outcome: Progressing

## 2022-08-21 NOTE — Progress Notes (Signed)
Adam [name] is in a pleasant and upbeat mood this evening.  Active in the dayroom interacting well with his peers.  He is med compliant and took his meds without incident.  He denies si  hi avh depression and anxiety at this encounter. Will continue to offer support and encouragement and redirect when necessary. Q15 minute safety checks in place.      C Butler-Nicholson, LPN

## 2022-08-21 NOTE — Plan of Care (Signed)
D: Patient alert and oriented. Patient denies pain. Patient denies anxiety and depression. Patient denies SI/HI/AVH.  A: Scheduled medications administered to patient, per MD orders.  Support and encouragement provided to patient.  Q15 minute safety checks maintained.   R: Patient compliant with medication administration and treatment plan. No adverse drug reactions noted. Patient remains safe on the unit at this time.   Problem: Education: Goal: Knowledge of General Education information will improve Description: Including pain rating scale, medication(s)/side effects and non-pharmacologic comfort measures Outcome: Progressing   Problem: Clinical Measurements: Goal: Ability to maintain clinical measurements within normal limits will improve Outcome: Progressing Goal: Will remain free from infection Outcome: Progressing   Problem: Safety: Goal: Ability to remain free from injury will improve Outcome: Progressing   Problem: Activity: Goal: Interest or engagement in leisure activities will improve Outcome: Progressing

## 2022-08-21 NOTE — Progress Notes (Signed)
Pharmacy - Clozapine     This patient's order has been reviewed for prescribing contraindications.   Clozapine REMS enrollment Verified: yes REMS number: [id number] Enrollment date: 01/04/2022 Current Outpatient Monitoring: every 2 weeks  Dose Adjustments This Admission: Clozapine 300mg  qhs since 03/19/2022  Labs:  7/26 : [facility] 2500 K/uL 8/02:  [place] 2200 8/21: [place] 2500 9/19: [place] 2451   Plan: Lab submitted to clozapine REMS Continue ANC labs every 2 weeks while inpatient   Lonnetta Kniskern Rodriguez-Guzman PharmD, BCPS 08/21/2022 10:53 AM

## 2022-08-21 NOTE — Progress Notes (Signed)
   08/21/22 0200  Psych Admission Type (Psych Patients Only)  Admission Status Involuntary  Psychosocial Assessment  Patient Complaints None  Eye Contact Fair  Facial Expression Animated  Affect Appropriate to circumstance  Speech Aphasic  Interaction Assertive;Childlike  Motor Activity Slow  Appearance/Hygiene Unremarkable  Behavior Characteristics Cooperative  Mood Pleasant  Thought Process  Coherency Circumstantial  Content Preoccupation  Delusions None reported or observed  Perception WDL  Hallucination None reported or observed  Judgment Impaired  Confusion None  Danger to Self  Current suicidal ideation? Denies  Danger to Others  Danger to Others None reported or observed  Danger to Others Abnormal  Harmful Behavior to others No threats or harm toward other people  Destructive Behavior No threats or harm toward property

## 2022-08-22 NOTE — Progress Notes (Signed)
[facility] MD Progress Note  08/22/2022 1:57 PM Adam [name]  MRN:  [id number] Subjective: Patient seen no complaints no change to behavior.  Relatively calm today Principal Problem: Schizophrenia, chronic condition with acute exacerbation (HCC) Diagnosis: Principal Problem:   Schizophrenia, chronic condition with acute exacerbation ([place])  Total Time spent with patient: 15 minutes  Past Psychiatric History: Past history of schizophrenia and stroke  Past Medical History:  Past Medical History:  Diagnosis Date   Myocardial infarction ([place])    Schizophrenia ([place])    Stroke ([place])    TBI (traumatic brain injury) ([place])    History reviewed. No pertinent surgical history. Family History: History reviewed. No pertinent family history. Family Psychiatric  History: See previous Social History:  Social History   Substance and Sexual Activity  Alcohol Use Not Currently     Social History   Substance and Sexual Activity  Drug Use Not Currently    Social History   Socioeconomic History   Marital status: Single    Spouse name: Not on file   Number of children: Not on file   Years of education: Not on file   Highest education level: Not on file  Occupational History   Not on file  Tobacco Use   Smoking status: Never    Passive exposure: Never   Smokeless tobacco: Never  Vaping Use   Vaping Use: Unknown  Substance and Sexual Activity   Alcohol use: Not Currently   Drug use: Not Currently   Sexual activity: Not Currently  Other Topics Concern   Not on file  Social History Narrative   Not on file   Social Determinants of Health   Financial Resource Strain: Not on file  Food Insecurity: Not on file  Transportation Needs: Not on file  Physical Activity: Not on file  Stress: Not on file  Social Connections: Not on file   Additional Social History:  Specify valuables returned:  (none)                      Sleep: Fair  Appetite:  Fair  Current Medications: Current  Facility-Administered Medications  Medication Dose Route Frequency Provider Last Rate Last Admin   acetaminophen (TYLENOL) tablet 650 mg  650 mg Oral Q6H PRN Abdelaziz Westenberger T, MD   650 mg at 08/18/22 0028   alum & mag hydroxide-simeth (MAALOX/MYLANTA) 200-200-20 MG/5ML suspension 30 mL  30 mL Oral Q4H PRN Delayne Sanzo T, MD   30 mL at 04/26/22 0277   aspirin EC tablet 81 mg  81 mg Oral Daily Naliah Eddington, [name], MD   81 mg at 08/22/22 0802   atorvastatin (LIPITOR) tablet 20 mg  20 mg Oral Daily Daphnie Venturini T, MD   20 mg at 08/22/22 0802   atropine 1 % ophthalmic solution 1 drop  1 drop Sublingual QID [name], MD   1 drop at 08/22/22 1139   benztropine (COGENTIN) tablet 0.5 mg  0.5 mg Oral BID Hernandez Losasso T, MD   0.5 mg at 08/22/22 0802   clonazePAM (KLONOPIN) tablet 1 mg  1 mg Oral TID PRN He, [name], MD   1 mg at 08/21/22 0013   cloZAPine (CLOZARIL) tablet 300 mg  300 mg Oral QHS Jesselle Laflamme T, MD   300 mg at 08/21/22 2108   diphenhydrAMINE (BENADRYL) capsule 50 mg  50 mg Oral Q6H PRN Earl Losee, [name], MD   50 mg at 08/21/22 0012   Or   diphenhydrAMINE (BENADRYL) injection  50 mg  50 mg Intramuscular Q6H PRN Mersadies Petree T, MD       feeding supplement (ENSURE ENLIVE / ENSURE PLUS) liquid 237 mL  237 mL Oral TID BM Huldah Marin T, MD   237 mL at 08/22/22 1352   gabapentin (NEURONTIN) capsule 300 mg  300 mg Oral TID Neftaly Inzunza T, MD   300 mg at 08/22/22 1139   haloperidol (HALDOL) tablet 5 mg  5 mg Oral TID Zylpha Poynor T, MD   5 mg at 08/22/22 1139   haloperidol (HALDOL) tablet 5 mg  5 mg Oral Q6H PRN Kyion Gautier, [name], MD   5 mg at 08/21/22 0013   Or   haloperidol lactate (HALDOL) injection 5 mg  5 mg Intramuscular Q6H PRN Gelila Well T, MD       lisinopril (ZESTRIL) tablet 5 mg  5 mg Oral Daily Jonael Paradiso T, MD   5 mg at 08/22/22 0802   magnesium hydroxide (MILK OF MAGNESIA) suspension 30 mL  30 mL Oral Daily PRN Leontina Skidmore T, MD   30 mL at 05/02/22 4665   metoprolol succinate  (TOPROL-XL) 24 hr tablet 25 mg  25 mg Oral Daily Senaya Dicenso T, MD   25 mg at 08/22/22 0801   ondansetron (ZOFRAN) tablet 4 mg  4 mg Oral Q8H PRN Jearld Lesch, NP   4 mg at 08/18/22 0052   paliperidone (INVEGA SUSTENNA) injection 156 mg  156 mg Intramuscular Q28 days Jolaine Fryberger, [name], MD   156 mg at 08/16/22 0833   temazepam (RESTORIL) capsule 15 mg  15 mg Oral QHS Raivyn Kabler T, MD   15 mg at 08/21/22 2108   ziprasidone (GEODON) injection 20 mg  20 mg Intramuscular Q12H PRN Masen Salvas, [name], MD   20 mg at 07/08/22 1453    Lab Results:  Results for orders placed or performed during the hospital encounter of 03/19/22 (from the past 48 hour(s))  SARS Coronavirus 2 by RT PCR (hospital order, performed in [facility] hospital lab) *cepheid single result test* Anterior Nasal Swab     Status: None   Collection Time: 08/21/22  9:05 AM   Specimen: Anterior Nasal Swab  Result Value Ref Range   SARS Coronavirus 2 by RT PCR NEGATIVE NEGATIVE    Comment: (NOTE) SARS-CoV-2 target nucleic acids are NOT DETECTED.  The SARS-CoV-2 RNA is generally detectable in upper and lower respiratory specimens during the acute phase of infection. The lowest concentration of SARS-CoV-2 viral copies this assay can detect is 250 copies / mL. A negative result does not preclude SARS-CoV-2 infection and should not be used as the sole basis for treatment or other patient management decisions.  A negative result may occur with improper specimen collection / handling, submission of specimen other than nasopharyngeal swab, presence of viral mutation(s) within the areas targeted by this assay, and inadequate number of viral copies (<250 copies / mL). A negative result must be combined with clinical observations, patient history, and epidemiological information.  Fact Sheet for Patients:   RoadLapTop.co.za  Fact Sheet for Healthcare Providers: http://kim-miller.com/  This  test is not yet approved or  cleared by the [place] FDA and has been authorized for detection and/or diagnosis of SARS-CoV-2 by FDA under an Emergency Use Authorization (EUA).  This EUA will remain in effect (meaning this test can be used) for the duration of the COVID-19 declaration under Section 564(b)(1) of the Act, 21 U.S.C. section 360bbb-3(b)(1), unless the authorization  is terminated or revoked sooner.  Performed at [facility], [address] [place], [place] [address]   CBC with Differential/Platelet     Status: None   Collection Time: 08/21/22  9:20 AM  Result Value Ref Range   WBC 4.3 4.0 - 10.5 K/uL   RBC 4.49 4.22 - 5.81 MIL/uL   Hemoglobin 13.6 13.0 - 17.0 g/dL   HCT 53.7 48.2 - 70.7 %   MCV 88.0 80.0 - 100.0 fL   MCH 30.3 26.0 - 34.0 pg   MCHC 34.4 30.0 - 36.0 g/dL   RDW 86.7 54.4 - 92.0 %   Platelets 160 150 - 400 K/uL   nRBC 0.0 0.0 - 0.2 %   Neutrophils Relative % 57 %   Neutro Abs 2.4 1.7 - 7.7 K/uL   Lymphocytes Relative 30 %   Lymphs Abs 1.3 0.7 - 4.0 K/uL   Monocytes Relative 11 %   Monocytes Absolute 0.5 0.1 - 1.0 K/uL   Eosinophils Relative 0 %   Eosinophils Absolute 0.0 0.0 - 0.5 K/uL   Basophils Relative 2 %   Basophils Absolute 0.1 0.0 - 0.1 K/uL   Immature Granulocytes 0 %   Abs Immature Granulocytes 0.01 0.00 - 0.07 K/uL    Comment: Performed at [facility], [address] [place], [place] [address]    Blood Alcohol level:  Lab Results  Component Value Date   [facility] <10 07/20/2021    Metabolic Disorder Labs: Lab Results  Component Value Date   HGBA1C 5.5 04/10/2022   MPG 111.15 04/10/2022   No results found for: "PROLACTIN" Lab Results  Component Value Date   CHOL 167 11/20/2021   TRIG 107 11/20/2021   HDL 26 (L) 11/20/2021   CHOLHDL 6.4 11/20/2021   VLDL 21 11/20/2021   LDLCALC 120 (H) 11/20/2021    Physical Findings: AIMS: Facial and Oral Movements Muscles of Facial Expression: None, normal Lips  and Perioral Area: None, normal Jaw: None, normal Tongue: None, normal,Extremity Movements Upper (arms, wrists, hands, fingers): None, normal Lower (legs, knees, ankles, toes): None, normal, Trunk Movements Neck, shoulders, hips: None, normal, Overall Severity Severity of abnormal movements (highest score from questions above): None, normal Incapacitation due to abnormal movements: None, normal Patient's awareness of abnormal movements (rate only patient's report): No Awareness, Dental Status Current problems with teeth and/or dentures?: No Does patient usually wear dentures?: No  CIWA:    COWS:     Musculoskeletal: Strength & Muscle Tone: within normal limits Gait & Station: normal Patient leans: N/A  Psychiatric Specialty Exam:  Presentation  General Appearance: Appropriate for Environment; Casual; Neat; Well Groomed  Eye Contact:Fair  Speech:Slow  Speech Volume:Decreased  Handedness:Left   Mood and Affect  Mood:Anxious  Affect:Congruent   Thought Process  Thought Processes:Goal Directed  Descriptions of Associations:Circumstantial  Orientation:Partial  Thought Content:Scattered  History of Schizophrenia/Schizoaffective disorder:Yes  Duration of Psychotic Symptoms:Greater than six months  Hallucinations:No data recorded Ideas of Reference:None  Suicidal Thoughts:No data recorded Homicidal Thoughts:No data recorded  Sensorium  Memory:Remote Good  Judgment:Fair  Insight:Fair   Executive Functions  Concentration:Fair  Attention Span:Fair  Recall:Fair  Fund of Knowledge:Fair  Language:Fair   Psychomotor Activity  Psychomotor Activity:No data recorded  Assets  Assets:No data recorded  Sleep  Sleep:No data recorded   Physical Exam: Physical Exam Vitals reviewed.  Constitutional:      Appearance: Normal appearance.  HENT:     Head: Normocephalic and atraumatic.     Mouth/Throat:  Pharynx: Oropharynx is clear.  Eyes:      Pupils: Pupils are equal, round, and reactive to light.  Cardiovascular:     Rate and Rhythm: Normal rate and regular rhythm.  Pulmonary:     Effort: Pulmonary effort is normal.     Breath sounds: Normal breath sounds.  Abdominal:     General: Abdomen is flat.     Palpations: Abdomen is soft.  Musculoskeletal:        General: Normal range of motion.  Skin:    General: Skin is warm and dry.  Neurological:     General: No focal deficit present.     Mental Status: He is alert. Mental status is at baseline.  Psychiatric:        Attention and Perception: He is inattentive.        Mood and Affect: Affect is blunt.        Speech: He is noncommunicative.    Review of Systems  Unable to perform ROS: Language   Blood pressure (!) 125/91, pulse 90, temperature 97.8 F (36.6 C), temperature source Oral, resp. rate 20, height 5\' 8"  (1.727 m), weight 85.8 kg, SpO2 100 %. Body mass index is 28.76 kg/m.   Treatment Plan Summary: Plan patient appears stable.  No new behavior problems.  No new medical issues.  No change to overall treatment plan.  Still looking for discharge.  , MD 08/22/2022, 1:57 PM

## 2022-08-22 NOTE — TOC Progression Note (Signed)
Transition of Care [facility]) - Progression Note    Patient Details  Name: Adam [name] MRN: [id number] Date of Birth: [birth year]  Transition of Care [facility]) CM/SW [place], LCSW Phone Number: 08/22/2022, 11:54 AM  Clinical Narrative:   Sent secure email with requested documents to Michail Jewels at [facility].  Expected Discharge Plan and Services                                                 Social Determinants of Health (SDOH) Interventions    Readmission Risk Interventions     No data to display

## 2022-08-22 NOTE — Progress Notes (Addendum)
Pt denies SI/HI/AVH and verbally agrees to approach staff if these become apparent or before harming themselves/others. Rates depression 0/10. Rates anxiety 0/10. Rates pain 0/10. Pt was a little more irritable today and needy. Pt would act like he was taking his medication and then spit them in the cup and put them in the trash. Pt did the same thing for afternoon meds but also tried putting them behind a chair. Pt did take the medications after doing that. Pt has been pacing around the unit here and there. Pt was in the day room for a little bit also. Pt at the end of the shift was able to put one sentence together when talking and it made some sense. Pt is more calm this evening and animated. Scheduled medications administered to pt, per MD orders. RN provided support and encouragement to pt. Q15 min safety checks implemented and continued. Pt safe on the unit. RN will continue to monitor and intervene as needed.  Problem: Coping: Goal: Coping ability will improve Outcome: Progressing   Problem: Education: Goal: Will be free of psychotic symptoms Outcome: Progressing     08/22/22 0802  Psych Admission Type (Psych Patients Only)  Admission Status Involuntary  Psychosocial Assessment  Patient Complaints None  Eye Contact Fair  Facial Expression Animated;Anxious  Affect Appropriate to circumstance  Speech Aphasic  Interaction Assertive;Demanding  Motor Activity Other (Comment) (WDL)  Appearance/Hygiene Unremarkable  Behavior Characteristics Cooperative;Appropriate to situation;Anxious  Mood Pleasant;Anxious  Thought Process  Coherency Circumstantial  Content Preoccupation  Delusions None reported or observed  Perception WDL  Hallucination None reported or observed  Judgment Impaired  Confusion None  Danger to Self  Current suicidal ideation? Denies  Danger to Others  Danger to Others None reported or observed  Danger to Others Abnormal  Harmful Behavior to others No threats or  harm toward other people  Destructive Behavior No threats or harm toward property

## 2022-08-23 NOTE — Progress Notes (Signed)
[facility] MD Progress Note  08/23/2022 10:36 AM Adam [name]  MRN:  [id number] Subjective: No change to behavior no evident new complaint Principal Problem: Schizophrenia, chronic condition with acute exacerbation (HCC) Diagnosis: Principal Problem:   Schizophrenia, chronic condition with acute exacerbation (HCC)  Total Time spent with patient: 15 minutes  Past Psychiatric History: History of schizophrenia and stroke  Past Medical History:  Past Medical History:  Diagnosis Date   Myocardial infarction (HCC)    Schizophrenia (HCC)    Stroke (HCC)    TBI (traumatic brain injury) (HCC)    History reviewed. No pertinent surgical history. Family History: History reviewed. No pertinent family history. Family Psychiatric  History: See previous Social History:  Social History   Substance and Sexual Activity  Alcohol Use Not Currently     Social History   Substance and Sexual Activity  Drug Use Not Currently    Social History   Socioeconomic History   Marital status: Single    Spouse name: Not on file   Number of children: Not on file   Years of education: Not on file   Highest education level: Not on file  Occupational History   Not on file  Tobacco Use   Smoking status: Never    Passive exposure: Never   Smokeless tobacco: Never  Vaping Use   Vaping Use: Unknown  Substance and Sexual Activity   Alcohol use: Not Currently   Drug use: Not Currently   Sexual activity: Not Currently  Other Topics Concern   Not on file  Social History Narrative   Not on file   Social Determinants of Health   Financial Resource Strain: Not on file  Food Insecurity: Not on file  Transportation Needs: Not on file  Physical Activity: Not on file  Stress: Not on file  Social Connections: Not on file   Additional Social History:  Specify valuables returned:  (none)                      Sleep: Fair  Appetite:  Fair  Current Medications: Current Facility-Administered  Medications  Medication Dose Route Frequency Provider Last Rate Last Admin   acetaminophen (TYLENOL) tablet 650 mg  650 mg Oral Q6H PRN [name] T, MD   650 mg at 08/18/22 0028   alum & mag hydroxide-simeth (MAALOX/MYLANTA) 200-200-20 MG/5ML suspension 30 mL  30 mL Oral Q4H PRN Chaya Dehaan T, MD   30 mL at 04/26/22 6333   aspirin EC tablet 81 mg  81 mg Oral Daily Janelle Spellman, [name], MD   81 mg at 08/23/22 0935   atorvastatin (LIPITOR) tablet 20 mg  20 mg Oral Daily Aneesha Holloran T, MD   20 mg at 08/23/22 0935   atropine 1 % ophthalmic solution 1 drop  1 drop Sublingual QID [name], MD   1 drop at 08/23/22 0935   benztropine (COGENTIN) tablet 0.5 mg  0.5 mg Oral BID Tamora Huneke T, MD   0.5 mg at 08/23/22 0935   clonazePAM (KLONOPIN) tablet 1 mg  1 mg Oral TID PRN He, [name], MD   1 mg at 08/22/22 1608   cloZAPine (CLOZARIL) tablet 300 mg  300 mg Oral QHS Aditri Louischarles T, MD   300 mg at 08/22/22 2055   diphenhydrAMINE (BENADRYL) capsule 50 mg  50 mg Oral Q6H PRN Sharif Rendell, [name], MD   50 mg at 08/21/22 0012   Or   diphenhydrAMINE (BENADRYL) injection 50 mg  50 mg  Intramuscular Q6H PRN Cache Bills, [name], MD       feeding supplement (ENSURE ENLIVE / ENSURE PLUS) liquid 237 mL  237 mL Oral TID BM Hillis Mcphatter T, MD   237 mL at 08/23/22 0948   gabapentin (NEURONTIN) capsule 300 mg  300 mg Oral TID Cristian Davitt, [name], MD   300 mg at 08/23/22 0935   haloperidol (HALDOL) tablet 5 mg  5 mg Oral TID Nyelah Emmerich, [name], MD   5 mg at 08/23/22 0935   haloperidol (HALDOL) tablet 5 mg  5 mg Oral Q6H PRN Marlin Brys, [name], MD   5 mg at 08/21/22 0013   Or   haloperidol lactate (HALDOL) injection 5 mg  5 mg Intramuscular Q6H PRN Jasean Ambrosia T, MD       lisinopril (ZESTRIL) tablet 5 mg  5 mg Oral Daily Soyla Bainter T, MD   5 mg at 08/23/22 0935   magnesium hydroxide (MILK OF MAGNESIA) suspension 30 mL  30 mL Oral Daily PRN Anshu Wehner T, MD   30 mL at 05/02/22 0627   metoprolol succinate (TOPROL-XL) 24 hr tablet  25 mg  25 mg Oral Daily Jacobie Stamey T, MD   25 mg at 08/23/22 0935   ondansetron (ZOFRAN) tablet 4 mg  4 mg Oral Q8H PRN Deloria Lair, NP   4 mg at 08/18/22 0052   paliperidone (INVEGA SUSTENNA) injection 156 mg  156 mg Intramuscular Q28 days Qiana Landgrebe, [name], MD   156 mg at 08/16/22 0833   temazepam (RESTORIL) capsule 15 mg  15 mg Oral QHS Marjo Grosvenor T, MD   15 mg at 08/22/22 2055   ziprasidone (GEODON) injection 20 mg  20 mg Intramuscular Q12H PRN Brownie Nehme, [name], MD   20 mg at 07/08/22 1453    Lab Results: No results found for this or any previous visit (from the past 48 hour(s)).  Blood Alcohol level:  Lab Results  Component Value Date   ETH <10 29/93/7169    Metabolic Disorder Labs: Lab Results  Component Value Date   HGBA1C 5.5 04/10/2022   MPG 111.15 04/10/2022   No results found for: "PROLACTIN" Lab Results  Component Value Date   CHOL 167 11/20/2021   TRIG 107 11/20/2021   HDL 26 (L) 11/20/2021   CHOLHDL 6.4 11/20/2021   VLDL 21 11/20/2021   LDLCALC 120 (H) 11/20/2021    Physical Findings: AIMS: Facial and Oral Movements Muscles of Facial Expression: None, normal Lips and Perioral Area: None, normal Jaw: None, normal Tongue: None, normal,Extremity Movements Upper (arms, wrists, hands, fingers): None, normal Lower (legs, knees, ankles, toes): None, normal, Trunk Movements Neck, shoulders, hips: None, normal, Overall Severity Severity of abnormal movements (highest score from questions above): None, normal Incapacitation due to abnormal movements: None, normal Patient's awareness of abnormal movements (rate only patient's report): No Awareness, Dental Status Current problems with teeth and/or dentures?: No Does patient usually wear dentures?: No  CIWA:    COWS:     Musculoskeletal: Strength & Muscle Tone: within normal limits Gait & Station: normal Patient leans: N/A  Psychiatric Specialty Exam:  Presentation  General Appearance: Appropriate for  Environment; Casual; Neat; Well Groomed  Eye Contact:Fair  Speech:Slow  Speech Volume:Decreased  Handedness:Left   Mood and Affect  Mood:Anxious  Affect:Congruent   Thought Process  Thought Processes:Goal Directed  Descriptions of Associations:Circumstantial  Orientation:Partial  Thought Content:Scattered  History of Schizophrenia/Schizoaffective disorder:Yes  Duration of Psychotic Symptoms:Greater than six months  Hallucinations:No data  recorded Ideas of Reference:None  Suicidal Thoughts:No data recorded Homicidal Thoughts:No data recorded  Sensorium  Memory:Remote Good  Judgment:Fair  Insight:Fair   Executive Functions  Concentration:Fair  Attention Span:Fair  Recall:Fair  Fund of Knowledge:Fair  Language:Fair   Psychomotor Activity  Psychomotor Activity:No data recorded  Assets  Assets:No data recorded  Sleep  Sleep:No data recorded   Physical Exam: Physical Exam Vitals and nursing note reviewed.  Constitutional:      Appearance: Normal appearance.  HENT:     Head: Normocephalic and atraumatic.     Mouth/Throat:     Pharynx: Oropharynx is clear.  Eyes:     Pupils: Pupils are equal, round, and reactive to light.  Cardiovascular:     Rate and Rhythm: Normal rate and regular rhythm.  Pulmonary:     Effort: Pulmonary effort is normal.     Breath sounds: Normal breath sounds.  Abdominal:     General: Abdomen is flat.     Palpations: Abdomen is soft.  Musculoskeletal:        General: Normal range of motion.  Skin:    General: Skin is warm and dry.  Neurological:     General: No focal deficit present.     Mental Status: He is alert. Mental status is at baseline.  Psychiatric:        Attention and Perception: Attention normal.        Mood and Affect: Mood normal. Affect is blunt.        Speech: He is noncommunicative.    Review of Systems  Unable to perform ROS: Language  Constitutional:  Negative for diaphoresis.    Blood pressure 130/89, pulse 95, temperature 97.9 F (36.6 C), temperature source Oral, resp. rate 18, height 5\' 8"  (1.727 m), weight 85.8 kg, SpO2 97 %. Body mass index is 28.76 kg/m.   Treatment Plan Summary: Plan no change to medication.  Continue to focus on discharge planning  , MD 08/23/2022, 10:36 AM

## 2022-08-23 NOTE — Progress Notes (Signed)
Recreation Therapy Notes  Date: 08/23/2022  Time: 10:40 am   Location: Craft room    Behavioral response: N/A   Intervention Topic: Happiness   Discussion/Intervention: Patient refused to attend group.   Clinical Observations/Feedback:  Patient refused to attend group.   Breylen Agyeman LRT/CTRS         Lachlan Pelto 08/23/2022 12:53 PM

## 2022-08-23 NOTE — Progress Notes (Signed)
Patient calm and pleasant during assessment denying SI/HI/AVH. Pt observed interacting appropriately with staff and peers on the unit. Pt compliant with medication administration per MD orders. Pt given education, support, and encouragement to be active in his treatment plan. Pt being monitored Q 15 minutes for safety per unit protocol, remains safe on the unit  

## 2022-08-23 NOTE — Plan of Care (Signed)
D- Patient alert and oriented to person and situation. Patient presented in a pleasant mood on assessment. 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 Today, he has been saying "[employer]". Patient denied SI, HI, AVH, and pain at this time. When asked if he was experiencing any signs/symptoms of depression/anxiety, patient stated "no", with a smile on his face. Patient had no stated goals for today.   A- Scheduled medications administered to patient, per MD orders. Support and encouragement provided.  Routine safety checks conducted every 15 minutes.  Patient informed to notify staff with problems or concerns.  R- No adverse drug reactions noted. Patient contracts for safety at this time. Patient compliant with medications and treatment plan. Patient receptive, calm, and cooperative. Patient interacts well with others on the unit.  Patient remains safe at this time.  Problem: Education: Goal: Ability to state activities that reduce stress will improve Outcome: Progressing   Problem: Coping: Goal: Ability to identify and develop effective coping behavior will improve Outcome: Progressing   Problem: Self-Concept: Goal: Ability to identify factors that promote anxiety will improve Outcome: Progressing Goal: Level of anxiety will decrease Outcome: Progressing Goal: Ability to modify response to factors that promote anxiety will improve Outcome: Progressing   Problem: Education: Goal: Knowledge of General Education information will improve Description: Including pain rating scale, medication(s)/side effects and non-pharmacologic comfort measures Outcome: Progressing   Problem: Health Behavior/Discharge Planning: Goal: Ability to manage health-related needs will improve Outcome: Progressing   Problem: Clinical Measurements: Goal:  Ability to maintain clinical measurements within normal limits will improve Outcome: Progressing Goal: Will remain free from infection Outcome: Progressing Goal: Diagnostic test results will improve Outcome: Progressing Goal: Respiratory complications will improve Outcome: Progressing Goal: Cardiovascular complication will be avoided Outcome: Progressing   Problem: Activity: Goal: Risk for activity intolerance will decrease Outcome: Progressing   Problem: Nutrition: Goal: Adequate nutrition will be maintained Outcome: Progressing   Problem: Coping: Goal: Level of anxiety will decrease Outcome: Progressing   Problem: Elimination: Goal: Will not experience complications related to bowel motility Outcome: Progressing Goal: Will not experience complications related to urinary retention Outcome: Progressing   Problem: Pain Managment: Goal: General experience of comfort will improve Outcome: Progressing   Problem: Safety: Goal: Ability to remain free from injury will improve Outcome: Progressing   Problem: Skin Integrity: Goal: Risk for impaired skin integrity will decrease Outcome: Progressing   Problem: Activity: Goal: Interest or engagement in leisure activities will improve Outcome: Progressing   Problem: Coping: Goal: Coping ability will improve Outcome: Progressing   Problem: Education: Goal: Will be free of psychotic symptoms Outcome: Progressing

## 2022-08-23 NOTE — Progress Notes (Signed)
Patient received his morning medication late, being that he woke up late.

## 2022-08-23 NOTE — Plan of Care (Signed)
  Problem: Self-Concept: Goal: Level of anxiety will decrease Outcome: Progressing   Problem: Self-Concept: Goal: Ability to modify response to factors that promote anxiety will improve Outcome: Progressing   Problem: Clinical Measurements: Goal: Cardiovascular complication will be avoided Outcome: Progressing   Problem: Nutrition: Goal: Adequate nutrition will be maintained Outcome: Progressing   Problem: Coping: Goal: Coping ability will improve Outcome: Progressing

## 2022-08-23 NOTE — Progress Notes (Signed)
Very cheerful mood. Singing in the halls interacting well with his peers.  He is active on the unit back and forth between his room and the day room.  He denies si hi avh  depression and anxiety.  He is med compliant and received his medication without incident.  Support and encouragement offered.  Has to be redirected at times to quiet down as his singing can get loud.  He is so far always cooperative. Will continue to monitor with q 15 minute safety checks.     C Butler-Nicholson, LPN

## 2022-08-23 NOTE — [facility] Group Notes (Signed)
[facility] Group Notes:  (Nursing/MHT/Case Management/Adjunct)  Date:  08/23/2022  Time:  9:53 AM  Type of Therapy:   Community Meeting  Participation Level:  Did Not Attend  Taijuan Serviss Travis Brighten Buzzelli 08/23/2022, 9:53 AM 

## 2022-08-24 NOTE — Progress Notes (Signed)
Patient denies SI/HI/AVH. Pt observed interacting appropriately with staff and peers on the unit. Pt compliant with medication administration per MD orders. Pt given education, support, and encouragement to be active in his treatment plan. Pt being monitored Q 15 minutes for safety per unit protocol, remains safe on the unit.  Patient has been irritable this evening, PRN medication given, Check MAR. Pt irritable with staff and the Pt in room 324.   Staff made the decision to move him back to room 314 so he would be away from the other patients. Patient has been calm since being moved

## 2022-08-24 NOTE — Progress Notes (Signed)
Recreation Therapy Notes    Date: 08/24/2022   Time: 10:40 am   Location: Court yard    Behavioral response: N/A   Intervention Topic: Leisure    Discussion/Intervention: Patient refused to attend group.   Clinical Observations/Feedback:  Patient refused to attend group.   Sumayya Muha LRT/CTRS        Clavin Ruhlman 08/24/2022 12:00 PM

## 2022-08-24 NOTE — Progress Notes (Signed)
[facility] MD Progress Note  08/24/2022 11:49 AM Adam [name]  MRN:  [id number] Subjective: Follow-up 61 year old man with schizophrenia.  Patient has no new complaints.  Behavior is stable.  No violence no aggression.  No obvious complaints are physical illnesses Principal Problem: Schizophrenia, chronic condition with acute exacerbation (HCC) Diagnosis: Principal Problem:   Schizophrenia, chronic condition with acute exacerbation ([place])  Total Time spent with patient: 20 minutes  Past Psychiatric History: Past history of schizophrenia  Past Medical History:  Past Medical History:  Diagnosis Date   Myocardial infarction ([place])    Schizophrenia ([place])    Stroke ([place])    TBI (traumatic brain injury) ([place])    History reviewed. No pertinent surgical history. Family History: History reviewed. No pertinent family history. Family Psychiatric  History: See previous Social History:  Social History   Substance and Sexual Activity  Alcohol Use Not Currently     Social History   Substance and Sexual Activity  Drug Use Not Currently    Social History   Socioeconomic History   Marital status: Single    Spouse name: Not on file   Number of children: Not on file   Years of education: Not on file   Highest education level: Not on file  Occupational History   Not on file  Tobacco Use   Smoking status: Never    Passive exposure: Never   Smokeless tobacco: Never  Vaping Use   Vaping Use: Unknown  Substance and Sexual Activity   Alcohol use: Not Currently   Drug use: Not Currently   Sexual activity: Not Currently  Other Topics Concern   Not on file  Social History Narrative   Not on file   Social Determinants of Health   Financial Resource Strain: Not on file  Food Insecurity: Not on file  Transportation Needs: Not on file  Physical Activity: Not on file  Stress: Not on file  Social Connections: Not on file   Additional Social History:  Specify valuables returned:  (none)                       Sleep: Fair  Appetite:  Fair  Current Medications: Current Facility-Administered Medications  Medication Dose Route Frequency Provider Last Rate Last Admin   acetaminophen (TYLENOL) tablet 650 mg  650 mg Oral Q6H PRN Patsye Sullivant T, MD   650 mg at 08/18/22 0028   alum & mag hydroxide-simeth (MAALOX/MYLANTA) 200-200-20 MG/5ML suspension 30 mL  30 mL Oral Q4H PRN Tavi Gaughran T, MD   30 mL at 04/26/22 8338   aspirin EC tablet 81 mg  81 mg Oral Daily Sueanne Maniaci, [name], MD   81 mg at 08/24/22 2505   atorvastatin (LIPITOR) tablet 20 mg  20 mg Oral Daily Trixy Loyola T, MD   20 mg at 08/24/22 0826   atropine 1 % ophthalmic solution 1 drop  1 drop Sublingual QID [name], MD   1 drop at 08/24/22 0826   benztropine (COGENTIN) tablet 0.5 mg  0.5 mg Oral BID Nechelle Petrizzo T, MD   0.5 mg at 08/24/22 3976   clonazePAM (KLONOPIN) tablet 1 mg  1 mg Oral TID PRN He, [name], MD   1 mg at 08/22/22 1608   cloZAPine (CLOZARIL) tablet 300 mg  300 mg Oral QHS Florestine Carmical T, MD   300 mg at 08/23/22 2058   diphenhydrAMINE (BENADRYL) capsule 50 mg  50 mg Oral Q6H PRN Stana Bayon, [name], MD  50 mg at 08/21/22 0012   Or   diphenhydrAMINE (BENADRYL) injection 50 mg  50 mg Intramuscular Q6H PRN Abisai Coble T, MD       feeding supplement (ENSURE ENLIVE / ENSURE PLUS) liquid 237 mL  237 mL Oral TID BM Westlyn Glaza T, MD   237 mL at 08/24/22 1007   gabapentin (NEURONTIN) capsule 300 mg  300 mg Oral TID Chester Romero T, MD   300 mg at 08/24/22 0825   haloperidol (HALDOL) tablet 5 mg  5 mg Oral TID Trevaughn Schear, [name], MD   5 mg at 08/24/22 6503   haloperidol (HALDOL) tablet 5 mg  5 mg Oral Q6H PRN Nikolay Demetriou, [name], MD   5 mg at 08/21/22 0013   Or   haloperidol lactate (HALDOL) injection 5 mg  5 mg Intramuscular Q6H PRN Eliceo Gladu T, MD       lisinopril (ZESTRIL) tablet 5 mg  5 mg Oral Daily Vitaly Wanat T, MD   5 mg at 08/24/22 0825   magnesium hydroxide (MILK OF MAGNESIA) suspension 30 mL  30  mL Oral Daily PRN Domani Bakos T, MD   30 mL at 05/02/22 5465   metoprolol succinate (TOPROL-XL) 24 hr tablet 25 mg  25 mg Oral Daily Sharrie Self T, MD   25 mg at 08/24/22 0825   ondansetron (ZOFRAN) tablet 4 mg  4 mg Oral Q8H PRN Jearld Lesch, NP   4 mg at 08/18/22 0052   paliperidone (INVEGA SUSTENNA) injection 156 mg  156 mg Intramuscular Q28 days Dasja Brase, [name], MD   156 mg at 08/16/22 0833   temazepam (RESTORIL) capsule 15 mg  15 mg Oral QHS Melek Pownall T, MD   15 mg at 08/23/22 2058   ziprasidone (GEODON) injection 20 mg  20 mg Intramuscular Q12H PRN Fabian Coca, [name], MD   20 mg at 07/08/22 1453    Lab Results: No results found for this or any previous visit (from the past 48 hour(s)).  Blood Alcohol level:  Lab Results  Component Value Date   ETH <10 07/20/2021    Metabolic Disorder Labs: Lab Results  Component Value Date   HGBA1C 5.5 04/10/2022   MPG 111.15 04/10/2022   No results found for: "PROLACTIN" Lab Results  Component Value Date   CHOL 167 11/20/2021   TRIG 107 11/20/2021   HDL 26 (L) 11/20/2021   CHOLHDL 6.4 11/20/2021   VLDL 21 11/20/2021   LDLCALC 120 (H) 11/20/2021    Physical Findings: AIMS: Facial and Oral Movements Muscles of Facial Expression: None, normal Lips and Perioral Area: None, normal Jaw: None, normal Tongue: None, normal,Extremity Movements Upper (arms, wrists, hands, fingers): None, normal Lower (legs, knees, ankles, toes): None, normal, Trunk Movements Neck, shoulders, hips: None, normal, Overall Severity Severity of abnormal movements (highest score from questions above): None, normal Incapacitation due to abnormal movements: None, normal Patient's awareness of abnormal movements (rate only patient's report): No Awareness, Dental Status Current problems with teeth and/or dentures?: No Does patient usually wear dentures?: No  CIWA:    COWS:     Musculoskeletal: Strength & Muscle Tone: within normal limits Gait & Station:  normal Patient leans: N/A  Psychiatric Specialty Exam:  Presentation  General Appearance: Appropriate for Environment; Casual; Neat; Well Groomed  Eye Contact:Fair  Speech:Slow  Speech Volume:Decreased  Handedness:Left   Mood and Affect  Mood:Anxious  Affect:Congruent   Thought Process  Thought Processes:Goal Directed  Descriptions of Associations:Circumstantial  Orientation:Partial  Thought Content:Scattered  History of Schizophrenia/Schizoaffective disorder:Yes  Duration of Psychotic Symptoms:Greater than six months  Hallucinations:No data recorded Ideas of Reference:None  Suicidal Thoughts:No data recorded Homicidal Thoughts:No data recorded  Sensorium  Memory:Remote Good  Judgment:Fair  Insight:Fair   Executive Functions  Concentration:Fair  Attention Span:Fair  Recall:Fair  Fund of Knowledge:Fair  Language:Fair   Psychomotor Activity  Psychomotor Activity:No data recorded  Assets  Assets:No data recorded  Sleep  Sleep:No data recorded   Physical Exam: Physical Exam Vitals and nursing note reviewed.  Constitutional:      Appearance: Normal appearance.  HENT:     Head: Normocephalic and atraumatic.     Mouth/Throat:     Pharynx: Oropharynx is clear.  Eyes:     Pupils: Pupils are equal, round, and reactive to light.  Cardiovascular:     Rate and Rhythm: Normal rate and regular rhythm.  Pulmonary:     Effort: Pulmonary effort is normal.     Breath sounds: Normal breath sounds.  Abdominal:     General: Abdomen is flat.     Palpations: Abdomen is soft.  Musculoskeletal:        General: Normal range of motion.  Skin:    General: Skin is warm and dry.  Neurological:     General: No focal deficit present.     Mental Status: He is alert. Mental status is at baseline.  Psychiatric:        Attention and Perception: Attention normal. He is attentive.        Mood and Affect: Mood normal. Affect is blunt.        Speech: He is  noncommunicative.        Thought Content: Thought content normal.    Review of Systems  Unable to perform ROS: Language   Blood pressure 135/89, pulse 91, temperature 97.9 F (36.6 C), temperature source Oral, resp. rate 18, height 5\' 8"  (1.727 m), weight 85.8 kg, SpO2 100 %. Body mass index is 28.76 kg/m.   Treatment Plan Summary: Plan completely stable.  Continue medicine.  Awaiting placement plans.  , MD 08/24/2022, 11:49 AM

## 2022-08-24 NOTE — Plan of Care (Signed)
D- Patient alert and oriented to person, place and situation. Patient presented in a pleasant mood on assessment. 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 Patient does answer questions with a "yes" or "no". Patient denied SI, HI, AVH, and pain at this time. Patient also denied any signs/symptoms of depression and anxiety, by stating "no", when this writer asked the question. Patient had no stated goals for today.  A- Scheduled medications administered to patient, per MD orders. Support and encouragement provided.  Routine safety checks conducted every 15 minutes.  Patient informed to notify staff with problems or concerns.  R- No adverse drug reactions noted. Patient contracts for safety at this time. Patient compliant with medications and treatment plan. Patient receptive, calm, and cooperative. Patient interacts well with others on the unit. Patient remains safe at this time.  Problem: Education: Goal: Ability to state activities that reduce stress will improve Outcome: Progressing   Problem: Coping: Goal: Ability to identify and develop effective coping behavior will improve Outcome: Progressing   Problem: Self-Concept: Goal: Ability to identify factors that promote anxiety will improve Outcome: Progressing Goal: Level of anxiety will decrease Outcome: Progressing Goal: Ability to modify response to factors that promote anxiety will improve Outcome: Progressing   Problem: Education: Goal: Knowledge of General Education information will improve Description: Including pain rating scale, medication(s)/side effects and non-pharmacologic comfort measures Outcome: Progressing   Problem: Health Behavior/Discharge Planning: Goal: Ability to manage health-related needs will improve Outcome: Progressing   Problem: Clinical Measurements: Goal: Ability to maintain clinical measurements within normal limits will improve Outcome: Progressing Goal: Will  remain free from infection Outcome: Progressing Goal: Diagnostic test results will improve Outcome: Progressing Goal: Respiratory complications will improve Outcome: Progressing Goal: Cardiovascular complication will be avoided Outcome: Progressing   Problem: Activity: Goal: Risk for activity intolerance will decrease Outcome: Progressing   Problem: Nutrition: Goal: Adequate nutrition will be maintained Outcome: Progressing   Problem: Coping: Goal: Level of anxiety will decrease Outcome: Progressing   Problem: Elimination: Goal: Will not experience complications related to bowel motility Outcome: Progressing Goal: Will not experience complications related to urinary retention Outcome: Progressing   Problem: Pain Managment: Goal: General experience of comfort will improve Outcome: Progressing   Problem: Safety: Goal: Ability to remain free from injury will improve Outcome: Progressing   Problem: Skin Integrity: Goal: Risk for impaired skin integrity will decrease Outcome: Progressing   Problem: Activity: Goal: Interest or engagement in leisure activities will improve Outcome: Progressing   Problem: Coping: Goal: Coping ability will improve Outcome: Progressing   Problem: Education: Goal: Will be free of psychotic symptoms Outcome: Progressing

## 2022-08-25 MED ORDER — HALOPERIDOL 1 MG PO TABS
2.0000 mg | ORAL_TABLET | Freq: Three times a day (TID) | ORAL | Status: DC
  Administered 2022-08-25 – 2022-09-21 (×75): 2 mg via ORAL
  Filled 2022-08-25 (×78): qty 2

## 2022-08-25 NOTE — [facility] Group Notes (Signed)
Pt did not attend psychoEd group.

## 2022-08-25 NOTE — Progress Notes (Signed)
Pt remains safe on the unit. Largely non verbal but is smiling and cooperative with meals and meds and is able to make his needs known.  Pt has been allowed by management special privileges due to his TBI and long term status on the unit so he has a cell phone that does internet only. Pt is safe, pt refused to complete his  self inventory today, but states ''NO'' to all questions when asked if he has any SI or HI. NO signs pt is responding to internal stimuli. Patient continues to fixate on the remote, and had escalated behaviors previous shift, but has improved since being moved to back hall where patient has access to his own TV. Denies pain. Pt is safe,  Will con't to monitor.

## 2022-08-25 NOTE — Progress Notes (Signed)
[facility] MD Progress Note  08/25/2022 1:06 PM Adam [name]  MRN:  [id number] Subjective: Patient seen and chart reviewed.  Patient is having a grumpy day today.  Staff believe he got instigated by other patients being angry last night.  In any case he is agitated today.  Not physically threatening but crying out more often demanding more phone privileges Principal Problem: Schizophrenia, chronic condition with acute exacerbation ([place]) Diagnosis: Principal Problem:   Schizophrenia, chronic condition with acute exacerbation ([place])  Total Time spent with patient: 30 minutes  Past Psychiatric History: Past history of schizophrenia  Past Medical History:  Past Medical History:  Diagnosis Date   Myocardial infarction ([place])    Schizophrenia ([place])    Stroke ([place])    TBI (traumatic brain injury) ([place])    History reviewed. No pertinent surgical history. Family History: History reviewed. No pertinent family history. Family Psychiatric  History: See previous Social History:  Social History   Substance and Sexual Activity  Alcohol Use Not Currently     Social History   Substance and Sexual Activity  Drug Use Not Currently    Social History   Socioeconomic History   Marital status: Single    Spouse name: Not on file   Number of children: Not on file   Years of education: Not on file   Highest education level: Not on file  Occupational History   Not on file  Tobacco Use   Smoking status: Never    Passive exposure: Never   Smokeless tobacco: Never  Vaping Use   Vaping Use: Unknown  Substance and Sexual Activity   Alcohol use: Not Currently   Drug use: Not Currently   Sexual activity: Not Currently  Other Topics Concern   Not on file  Social History Narrative   Not on file   Social Determinants of Health   Financial Resource Strain: Not on file  Food Insecurity: Not on file  Transportation Needs: Not on file  Physical Activity: Not on file  Stress: Not on file  Social  Connections: Not on file   Additional Social History:  Specify valuables returned:  (none)                      Sleep: Fair  Appetite:  Fair  Current Medications: Current Facility-Administered Medications  Medication Dose Route Frequency Provider Last Rate Last Admin   acetaminophen (TYLENOL) tablet 650 mg  650 mg Oral Q6H PRN Cas Tracz T, MD   650 mg at 08/18/22 0028   alum & mag hydroxide-simeth (MAALOX/MYLANTA) 200-200-20 MG/5ML suspension 30 mL  30 mL Oral Q4H PRN Scharlene Catalina T, MD   30 mL at 04/26/22 [id number]   aspirin EC tablet 81 mg  81 mg Oral Daily Katrin Grabel, [name], MD   81 mg at 08/25/22 0827   atorvastatin (LIPITOR) tablet 20 mg  20 mg Oral Daily Oliverio Cho T, MD   20 mg at 08/25/22 0828   atropine 1 % ophthalmic solution 1 drop  1 drop Sublingual QID [name], MD   1 drop at 08/25/22 1157   benztropine (COGENTIN) tablet 0.5 mg  0.5 mg Oral BID Baylon Santelli T, MD   0.5 mg at 08/25/22 0827   clonazePAM (KLONOPIN) tablet 1 mg  1 mg Oral TID PRN He, [name], MD   1 mg at 08/24/22 2035   cloZAPine (CLOZARIL) tablet 300 mg  300 mg Oral QHS Tanaja Ganger T, MD   300 mg at 08/24/22  2121   diphenhydrAMINE (BENADRYL) capsule 50 mg  50 mg Oral Q6H PRN Takiah Maiden, [name], MD   50 mg at 08/21/22 0012   Or   diphenhydrAMINE (BENADRYL) injection 50 mg  50 mg Intramuscular Q6H PRN Rosalynd Mcwright T, MD       feeding supplement (ENSURE ENLIVE / ENSURE PLUS) liquid 237 mL  237 mL Oral TID BM Adalbert Alberto T, MD   237 mL at 08/25/22 0959   gabapentin (NEURONTIN) capsule 300 mg  300 mg Oral TID Aydeen Blume T, MD   300 mg at 08/25/22 1157   haloperidol (HALDOL) tablet 5 mg  5 mg Oral TID Evie Crumpler, [name], MD   5 mg at 08/25/22 1157   haloperidol (HALDOL) tablet 5 mg  5 mg Oral Q6H PRN Carrol Hougland, [name], MD   5 mg at 08/21/22 0013   Or   haloperidol lactate (HALDOL) injection 5 mg  5 mg Intramuscular Q6H PRN Ercia Crisafulli T, MD       lisinopril (ZESTRIL) tablet 5 mg  5 mg Oral Daily  Jahad Old T, MD   5 mg at 08/25/22 0827   magnesium hydroxide (MILK OF MAGNESIA) suspension 30 mL  30 mL Oral Daily PRN Zakkery Dorian T, MD   30 mL at 05/02/22 0627   metoprolol succinate (TOPROL-XL) 24 hr tablet 25 mg  25 mg Oral Daily Kohlton Gilpatrick T, MD   25 mg at 08/25/22 0828   ondansetron (ZOFRAN) tablet 4 mg  4 mg Oral Q8H PRN [name], NP   4 mg at 08/18/22 0052   paliperidone (INVEGA SUSTENNA) injection 156 mg  156 mg Intramuscular Q28 days Yossi Hinchman, [name], MD   156 mg at 08/16/22 0833   temazepam (RESTORIL) capsule 15 mg  15 mg Oral QHS Benjamin Casanas T, MD   15 mg at 08/24/22 2121   ziprasidone (GEODON) injection 20 mg  20 mg Intramuscular Q12H PRN Danee Soller, [name], MD   20 mg at 07/08/22 1453    Lab Results: No results found for this or any previous visit (from the past 48 hour(s)).  Blood Alcohol level:  Lab Results  Component Value Date   ETH <10 [id number]    Metabolic Disorder Labs: Lab Results  Component Value Date   HGBA1C 5.5 04/10/2022   MPG 111.15 04/10/2022   No results found for: "PROLACTIN" Lab Results  Component Value Date   CHOL 167 11/20/2021   TRIG 107 11/20/2021   HDL 26 (L) 11/20/2021   CHOLHDL 6.4 11/20/2021   VLDL 21 11/20/2021   LDLCALC 120 (H) 11/20/2021    Physical Findings: AIMS: Facial and Oral Movements Muscles of Facial Expression: None, normal Lips and Perioral Area: None, normal Jaw: None, normal Tongue: None, normal,Extremity Movements Upper (arms, wrists, hands, fingers): None, normal Lower (legs, knees, ankles, toes): None, normal, Trunk Movements Neck, shoulders, hips: None, normal, Overall Severity Severity of abnormal movements (highest score from questions above): None, normal Incapacitation due to abnormal movements: None, normal Patient's awareness of abnormal movements (rate only patient's report): No Awareness, Dental Status Current problems with teeth and/or dentures?: No Does patient usually wear dentures?:  No  CIWA:    COWS:     Musculoskeletal: Strength & Muscle Tone: within normal limits Gait & Station: normal Patient leans: N/A  Psychiatric Specialty Exam:  Presentation  General Appearance: Appropriate for Environment; Casual; Neat; Well Groomed  Eye Contact:Fair  Speech:Slow  Speech Volume:Decreased  Handedness:Left   Mood and  Affect  Mood:Anxious  Affect:Congruent   Thought Process  Thought Processes:Goal Directed  Descriptions of Associations:Circumstantial  Orientation:Partial  Thought Content:Scattered  History of Schizophrenia/Schizoaffective disorder:Yes  Duration of Psychotic Symptoms:Greater than six months  Hallucinations:No data recorded Ideas of Reference:None  Suicidal Thoughts:No data recorded Homicidal Thoughts:No data recorded  Sensorium  Memory:Remote Good  Judgment:Fair  Insight:Fair   Executive Functions  Concentration:Fair  Attention Span:Fair  [place]   Psychomotor Activity  Psychomotor Activity:No data recorded  Assets  Assets:No data recorded  Sleep  Sleep:No data recorded   Physical Exam: Physical Exam Vitals and nursing note reviewed.  Constitutional:      Appearance: Normal appearance.  HENT:     Head: Normocephalic and atraumatic.     Mouth/Throat:     Pharynx: Oropharynx is clear.  Eyes:     Pupils: Pupils are equal, round, and reactive to light.  Cardiovascular:     Rate and Rhythm: Normal rate and regular rhythm.  Pulmonary:     Effort: Pulmonary effort is normal.     Breath sounds: Normal breath sounds.  Abdominal:     General: Abdomen is flat.     Palpations: Abdomen is soft.  Musculoskeletal:        General: Normal range of motion.  Skin:    General: Skin is warm and dry.  Neurological:     General: No focal deficit present.     Mental Status: He is alert. Mental status is at baseline.  Psychiatric:        Mood and Affect: Mood normal.         Thought Content: Thought content normal.    Review of Systems  Unable to perform ROS: Language  Neurological: Negative.    Blood pressure (!) 125/90, pulse 81, temperature 98.2 F (36.8 C), temperature source Oral, resp. rate 18, height 5\' 8"  (1.727 m), weight 85.8 kg, SpO2 98 %. Body mass index is 28.76 kg/m.   Treatment Plan Summary: Medication management and Plan supportive counseling.  I saw him pacing a little bit today and wondered if he may be having a little akathisia.  I am going to cut back on his Haldol which is at a pretty high dose considering the other medicines he is taking.  Go back down to 2 mg 3 times a day.  Alethia Berthold, MD 08/25/2022, 1:06 PM

## 2022-08-25 NOTE — Plan of Care (Signed)
Pt denies experiencing any depression/anxiety at this time. Pt denies experiencing any SI/HI/AVH at this time. Pt presents as irritable and demanding this evening. Pt monitored q15 minute for safety per unit policy. Pt is cooperative and medication compliant. Pt provided support and encouragement. Plan of care ongoing.  Problem: Nutrition: Goal: Adequate nutrition will be maintained Outcome: Progressing   Problem: Self-Concept: Goal: Ability to modify response to factors that promote anxiety will improve Outcome: Not Progressing

## 2022-08-25 NOTE — [facility] Group Notes (Signed)
[place] Group Notes:  (Nursing/MHT/Case Management/Adjunct)  Date:  08/25/2022  Time:  3:09 PM  Type of Therapy:  Group Therapy  Participation Level:  Did Not Attend    Summary of Progress/Problems: pt did not attend group.  Pt has been up and down with his emotions. Pt has been upset about his phone, about not having an ascom phone, pt wristband, as well as, other issues staff could not identify  Stryker E 08/25/2022, 3:09 PM

## 2022-08-26 NOTE — Plan of Care (Signed)
Met with PT in room. Pt is pleasant and cooperative. Denies SI / HI. No signs of distress or injury. Pt is adherent with scheduled medications. Staff will continue monitor.    Problem: Education: Goal: Ability to state activities that reduce stress will improve Outcome: Not Progressing   Problem: Coping: Goal: Ability to identify and develop effective coping behavior will improve Outcome: Not Progressing

## 2022-08-26 NOTE — [facility] Group Notes (Signed)
[place] Group Notes:  (Nursing/MHT/Case Management/Adjunct)  Date:  08/26/2022  Time:  5:22 PM  Type of Therapy:  Group Therapy  Participation Level:  Did Not Attend    [name],Kayode Petion E 08/26/2022, 5:22 PM

## 2022-08-26 NOTE — Progress Notes (Signed)
[facility] MD Progress Note  08/26/2022 11:44 AM Adam [name]  MRN:  [id number] Subjective: Follow-up patient with schizophrenia.  Calmer today.  Not as agitated.  No specific complaints no major behavior problems.  Physically stable. Principal Problem: Schizophrenia, chronic condition with acute exacerbation ([facility]) Diagnosis: Principal Problem:   Schizophrenia, chronic condition with acute exacerbation ([facility])  Total Time spent with patient: 20 minutes  Past Psychiatric History: Past history of schizophrenia  Past Medical History:  Past Medical History:  Diagnosis Date   Myocardial infarction ([facility])    Schizophrenia ([facility])    Stroke ([facility])    TBI (traumatic brain injury) ([facility])    History reviewed. No pertinent surgical history. Family History: History reviewed. No pertinent family history. Family Psychiatric  History: See previous Social History:  Social History   Substance and Sexual Activity  Alcohol Use Not Currently     Social History   Substance and Sexual Activity  Drug Use Not Currently    Social History   Socioeconomic History   Marital status: Single    Spouse name: Not on file   Number of children: Not on file   Years of education: Not on file   Highest education level: Not on file  Occupational History   Not on file  Tobacco Use   Smoking status: Never    Passive exposure: Never   Smokeless tobacco: Never  Vaping Use   Vaping Use: Unknown  Substance and Sexual Activity   Alcohol use: Not Currently   Drug use: Not Currently   Sexual activity: Not Currently  Other Topics Concern   Not on file  Social History Narrative   Not on file   Social Determinants of Health   Financial Resource Strain: Not on file  Food Insecurity: Not on file  Transportation Needs: Not on file  Physical Activity: Not on file  Stress: Not on file  Social Connections: Not on file   Additional Social History:  Specify valuables returned:  (none)                       Sleep: Fair  Appetite:  Fair  Current Medications: Current Facility-Administered Medications  Medication Dose Route Frequency Provider Last Rate Last Admin   acetaminophen (TYLENOL) tablet 650 mg  650 mg Oral Q6H PRN Yamileth Hayse T, MD   650 mg at 08/18/22 0028   alum & mag hydroxide-simeth (MAALOX/MYLANTA) 200-200-20 MG/5ML suspension 30 mL  30 mL Oral Q4H PRN Seve Monette T, MD   30 mL at 04/26/22 0272   aspirin EC tablet 81 mg  81 mg Oral Daily Raguel Kosloski, [name], MD   81 mg at 08/26/22 5366   atorvastatin (LIPITOR) tablet 20 mg  20 mg Oral Daily Merilynn Haydu T, MD   20 mg at 08/26/22 4403   atropine 1 % ophthalmic solution 1 drop  1 drop Sublingual QID [name], MD   1 drop at 08/26/22 1128   benztropine (COGENTIN) tablet 0.5 mg  0.5 mg Oral BID Dorianne Perret T, MD   0.5 mg at 08/26/22 0811   clonazePAM (KLONOPIN) tablet 1 mg  1 mg Oral TID PRN He, [name], MD   1 mg at 08/24/22 2035   cloZAPine (CLOZARIL) tablet 300 mg  300 mg Oral QHS Derric Dealmeida T, MD   300 mg at 08/25/22 2102   diphenhydrAMINE (BENADRYL) capsule 50 mg  50 mg Oral Q6H PRN Eliya Geiman, [name], MD   50 mg at 08/21/22 0012  Or   diphenhydrAMINE (BENADRYL) injection 50 mg  50 mg Intramuscular Q6H PRN Woodson Macha T, MD       feeding supplement (ENSURE ENLIVE / ENSURE PLUS) liquid 237 mL  237 mL Oral TID BM Desirey Keahey T, MD   237 mL at 08/26/22 1043   gabapentin (NEURONTIN) capsule 300 mg  300 mg Oral TID Kenady Doxtater T, MD   300 mg at 08/26/22 1127   haloperidol (HALDOL) tablet 2 mg  2 mg Oral TID Nicole Hafley, [name], MD   2 mg at 08/26/22 1127   haloperidol (HALDOL) tablet 5 mg  5 mg Oral Q6H PRN Muslima Toppins, [name], MD   5 mg at 08/21/22 0013   Or   haloperidol lactate (HALDOL) injection 5 mg  5 mg Intramuscular Q6H PRN Hill Mackie T, MD       lisinopril (ZESTRIL) tablet 5 mg  5 mg Oral Daily Dicy Smigel T, MD   5 mg at 08/26/22 0347   magnesium hydroxide (MILK OF MAGNESIA) suspension 30 mL  30 mL Oral Daily PRN  Meika Earll T, MD   30 mL at 05/02/22 0627   metoprolol succinate (TOPROL-XL) 24 hr tablet 25 mg  25 mg Oral Daily Suvan Stcyr T, MD   25 mg at 08/26/22 0812   ondansetron (ZOFRAN) tablet 4 mg  4 mg Oral Q8H PRN [name], NP   4 mg at 08/18/22 0052   paliperidone (INVEGA SUSTENNA) injection 156 mg  156 mg Intramuscular Q28 days Stephon Weathers, [name], MD   156 mg at 08/16/22 0833   temazepam (RESTORIL) capsule 15 mg  15 mg Oral QHS Zamar Odwyer T, MD   15 mg at 08/25/22 2102   ziprasidone (GEODON) injection 20 mg  20 mg Intramuscular Q12H PRN Maleeya Peterkin, [name], MD   20 mg at 07/08/22 1453    Lab Results: No results found for this or any previous visit (from the past 48 hour(s)).  Blood Alcohol level:  Lab Results  Component Value Date   ETH <10 42/59/5638    Metabolic Disorder Labs: Lab Results  Component Value Date   HGBA1C 5.5 04/10/2022   MPG 111.15 04/10/2022   No results found for: "PROLACTIN" Lab Results  Component Value Date   CHOL 167 11/20/2021   TRIG 107 11/20/2021   HDL 26 (L) 11/20/2021   CHOLHDL 6.4 11/20/2021   VLDL 21 11/20/2021   LDLCALC 120 (H) 11/20/2021    Physical Findings: AIMS: Facial and Oral Movements Muscles of Facial Expression: None, normal Lips and Perioral Area: None, normal Jaw: None, normal Tongue: None, normal,Extremity Movements Upper (arms, wrists, hands, fingers): None, normal Lower (legs, knees, ankles, toes): None, normal, Trunk Movements Neck, shoulders, hips: None, normal, Overall Severity Severity of abnormal movements (highest score from questions above): None, normal Incapacitation due to abnormal movements: None, normal Patient's awareness of abnormal movements (rate only patient's report): No Awareness, Dental Status Current problems with teeth and/or dentures?: No Does patient usually wear dentures?: No  CIWA:    COWS:     Musculoskeletal: Strength & Muscle Tone: within normal limits Gait & Station: normal Patient  leans: N/A  Psychiatric Specialty Exam:  Presentation  General Appearance: Appropriate for Environment; Casual; Neat; Well Groomed  Eye Contact:Fair  Speech:Slow  Speech Volume:Decreased  Handedness:Left   Mood and Affect  Mood:Anxious  Affect:Congruent   Thought Process  Thought Processes:Goal Directed  Descriptions of Associations:Circumstantial  Orientation:Partial  Thought Content:Scattered  History of Schizophrenia/Schizoaffective disorder:Yes  Duration of Psychotic Symptoms:Greater than six months  Hallucinations:No data recorded Ideas of Reference:None  Suicidal Thoughts:No data recorded Homicidal Thoughts:No data recorded  Sensorium  Memory:Remote Good  Judgment:Fair  Insight:Fair   Executive Functions  Concentration:Fair  Attention Span:Fair  Recall:Fair  Fund of Knowledge:Fair  Language:Fair   Psychomotor Activity  Psychomotor Activity:No data recorded  Assets  Assets:No data recorded  Sleep  Sleep:No data recorded   Physical Exam: Physical Exam Vitals and nursing note reviewed.  Constitutional:      Appearance: Normal appearance.  HENT:     Head: Normocephalic and atraumatic.     Mouth/Throat:     Pharynx: Oropharynx is clear.  Eyes:     Pupils: Pupils are equal, round, and reactive to light.  Cardiovascular:     Rate and Rhythm: Normal rate and regular rhythm.  Pulmonary:     Effort: Pulmonary effort is normal.     Breath sounds: Normal breath sounds.  Abdominal:     General: Abdomen is flat.     Palpations: Abdomen is soft.  Musculoskeletal:        General: Normal range of motion.  Skin:    General: Skin is warm and dry.  Neurological:     General: No focal deficit present.     Mental Status: He is alert. Mental status is at baseline.  Psychiatric:        Mood and Affect: Mood normal.        Thought Content: Thought content normal.    Review of Systems  Unable to perform ROS: Language  Neurological:  Negative.    Blood pressure 115/70, pulse 91, temperature 98.2 F (36.8 C), temperature source Oral, resp. rate 18, height 5\' 8"  (1.727 m), weight 85.8 kg, SpO2 100 %. Body mass index is 28.76 kg/m.   Treatment Plan Summary: Plan no change to medication.  Supportive counseling.  Continue working on discharge planning  , MD 08/26/2022, 11:44 AM

## 2022-08-27 NOTE — Progress Notes (Signed)
Pleasant and cooperative this shift. No complaints voiced. Noted yelling from room singing. Redirectable. Encouragement and support provided. Safety checks maintained. Meds given as prescribed. Pt receptive and remains safe on unit with q 15 min checks.

## 2022-08-27 NOTE — [facility] Group Notes (Signed)
[place] Group Notes:  (Nursing/MHT/Case Management/Adjunct)  Date:  08/27/2022  Time:  9:49 AM  Type of Therapy:   community meeting  Participation Level:  Did Not Attend    Antonieta Pert 08/27/2022, 9:49 AM

## 2022-08-27 NOTE — Progress Notes (Signed)
[facility] MD Progress Note  08/27/2022 1:41 PM Adam [name]  MRN:  [id number] Subjective: No new complaints.  No changes to behavior.  Very stable Principal Problem: Schizophrenia, chronic condition with acute exacerbation (HCC) Diagnosis: Principal Problem:   Schizophrenia, chronic condition with acute exacerbation (HCC)  Total Time spent with patient: 20 minutes  Past Psychiatric History: Past history of schizophrenia  Past Medical History:  Past Medical History:  Diagnosis Date   Myocardial infarction (HCC)    Schizophrenia (HCC)    Stroke (HCC)    TBI (traumatic brain injury) (HCC)    History reviewed. No pertinent surgical history. Family History: History reviewed. No pertinent family history. Family Psychiatric  History: See previous Social History:  Social History   Substance and Sexual Activity  Alcohol Use Not Currently     Social History   Substance and Sexual Activity  Drug Use Not Currently    Social History   Socioeconomic History   Marital status: Single    Spouse name: Not on file   Number of children: Not on file   Years of education: Not on file   Highest education level: Not on file  Occupational History   Not on file  Tobacco Use   Smoking status: Never    Passive exposure: Never   Smokeless tobacco: Never  Vaping Use   Vaping Use: Unknown  Substance and Sexual Activity   Alcohol use: Not Currently   Drug use: Not Currently   Sexual activity: Not Currently  Other Topics Concern   Not on file  Social History Narrative   Not on file   Social Determinants of Health   Financial Resource Strain: Not on file  Food Insecurity: Not on file  Transportation Needs: Not on file  Physical Activity: Not on file  Stress: Not on file  Social Connections: Not on file   Additional Social History:  Specify valuables returned:  (none)                      Sleep: Fair  Appetite:  Fair  Current Medications: Current Facility-Administered  Medications  Medication Dose Route Frequency Provider Last Rate Last Admin   acetaminophen (TYLENOL) tablet 650 mg  650 mg Oral Q6H PRN Luca Dyar T, MD   650 mg at 08/18/22 0028   alum & mag hydroxide-simeth (MAALOX/MYLANTA) 200-200-20 MG/5ML suspension 30 mL  30 mL Oral Q4H PRN Hildagarde Holleran T, MD   30 mL at 04/26/22 0086   aspirin EC tablet 81 mg  81 mg Oral Daily Sophina Mitten, [name], MD   81 mg at 08/27/22 0815   atorvastatin (LIPITOR) tablet 20 mg  20 mg Oral Daily Ivelis Norgard T, MD   20 mg at 08/27/22 0815   atropine 1 % ophthalmic solution 1 drop  1 drop Sublingual QID [name], MD   1 drop at 08/27/22 1138   benztropine (COGENTIN) tablet 0.5 mg  0.5 mg Oral BID Elliana Bal T, MD   0.5 mg at 08/27/22 0814   clonazePAM (KLONOPIN) tablet 1 mg  1 mg Oral TID PRN He, [name], MD   1 mg at 08/24/22 2035   cloZAPine (CLOZARIL) tablet 300 mg  300 mg Oral QHS Evangelynn Lochridge T, MD   300 mg at 08/26/22 2107   diphenhydrAMINE (BENADRYL) capsule 50 mg  50 mg Oral Q6H PRN Infinity Jeffords, [name], MD   50 mg at 08/21/22 0012   Or   diphenhydrAMINE (BENADRYL) injection 50 mg  50 mg Intramuscular Q6H PRN Jasamine Pottinger T, MD       feeding supplement (ENSURE ENLIVE / ENSURE PLUS) liquid 237 mL  237 mL Oral TID BM Eleazar Kimmey T, MD   237 mL at 08/27/22 1306   gabapentin (NEURONTIN) capsule 300 mg  300 mg Oral TID Devra Stare T, MD   300 mg at 08/27/22 1137   haloperidol (HALDOL) tablet 2 mg  2 mg Oral TID Anthea Udovich, [name], MD   2 mg at 08/27/22 1137   haloperidol (HALDOL) tablet 5 mg  5 mg Oral Q6H PRN Damontay Alred, [name], MD   5 mg at 08/21/22 0013   Or   haloperidol lactate (HALDOL) injection 5 mg  5 mg Intramuscular Q6H PRN Toy Samarin T, MD       lisinopril (ZESTRIL) tablet 5 mg  5 mg Oral Daily Homer Miller T, MD   5 mg at 08/27/22 0815   magnesium hydroxide (MILK OF MAGNESIA) suspension 30 mL  30 mL Oral Daily PRN Jamauri Kruzel T, MD   30 mL at 05/02/22 0627   metoprolol succinate (TOPROL-XL) 24 hr tablet  25 mg  25 mg Oral Daily Joliene Salvador T, MD   25 mg at 08/27/22 0816   ondansetron (ZOFRAN) tablet 4 mg  4 mg Oral Q8H PRN Lerry Liner M, NP   4 mg at 08/18/22 0052   paliperidone (INVEGA SUSTENNA) injection 156 mg  156 mg Intramuscular Q28 days Adekunle Rohrbach, [name], MD   156 mg at 08/16/22 0833   temazepam (RESTORIL) capsule 15 mg  15 mg Oral QHS Jannell Franta T, MD   15 mg at 08/26/22 2107   ziprasidone (GEODON) injection 20 mg  20 mg Intramuscular Q12H PRN Benjimin Hadden, [name], MD   20 mg at 07/08/22 1453    Lab Results: No results found for this or any previous visit (from the past 48 hour(s)).  Blood Alcohol level:  Lab Results  Component Value Date   ETH <10 07/20/2021    Metabolic Disorder Labs: Lab Results  Component Value Date   HGBA1C 5.5 04/10/2022   MPG 111.15 04/10/2022   No results found for: "PROLACTIN" Lab Results  Component Value Date   CHOL 167 11/20/2021   TRIG 107 11/20/2021   HDL 26 (L) 11/20/2021   CHOLHDL 6.4 11/20/2021   VLDL 21 11/20/2021   LDLCALC 120 (H) 11/20/2021    Physical Findings: AIMS: Facial and Oral Movements Muscles of Facial Expression: None, normal Lips and Perioral Area: None, normal Jaw: None, normal Tongue: None, normal,Extremity Movements Upper (arms, wrists, hands, fingers): None, normal Lower (legs, knees, ankles, toes): None, normal, Trunk Movements Neck, shoulders, hips: None, normal, Overall Severity Severity of abnormal movements (highest score from questions above): None, normal Incapacitation due to abnormal movements: None, normal Patient's awareness of abnormal movements (rate only patient's report): No Awareness, Dental Status Current problems with teeth and/or dentures?: No Does patient usually wear dentures?: No  CIWA:    COWS:     Musculoskeletal: Strength & Muscle Tone: within normal limits Gait & Station: normal Patient leans: N/A  Psychiatric Specialty Exam:  Presentation  General Appearance: Appropriate for  Environment; Casual; Neat; Well Groomed  Eye Contact:Fair  Speech:Slow  Speech Volume:Decreased  Handedness:Left   Mood and Affect  Mood:Anxious  Affect:Congruent   Thought Process  Thought Processes:Goal Directed  Descriptions of Associations:Circumstantial  Orientation:Partial  Thought Content:Scattered  History of Schizophrenia/Schizoaffective disorder:Yes  Duration of Psychotic Symptoms:Greater than six months  Hallucinations:No data recorded Ideas of Reference:None  Suicidal Thoughts:No data recorded Homicidal Thoughts:No data recorded  Sensorium  Memory:Remote Good  Judgment:Fair  Insight:Fair   Executive Functions  Concentration:Fair  Attention Span:Fair  [place]   Psychomotor Activity  Psychomotor Activity:No data recorded  Assets  Assets:No data recorded  Sleep  Sleep:No data recorded   Physical Exam: Physical Exam Vitals and nursing note reviewed.  Constitutional:      Appearance: Normal appearance.  HENT:     Head: Normocephalic and atraumatic.     Mouth/Throat:     Pharynx: Oropharynx is clear.  Eyes:     Pupils: Pupils are equal, round, and reactive to light.  Cardiovascular:     Rate and Rhythm: Normal rate and regular rhythm.  Pulmonary:     Effort: Pulmonary effort is normal.     Breath sounds: Normal breath sounds.  Abdominal:     General: Abdomen is flat.     Palpations: Abdomen is soft.  Musculoskeletal:        General: Normal range of motion.  Skin:    General: Skin is warm and dry.  Neurological:     General: No focal deficit present.     Mental Status: He is alert. Mental status is at baseline.  Psychiatric:        Mood and Affect: Mood normal.        Thought Content: Thought content normal.    Review of Systems  Unable to perform ROS: Language   Blood pressure 103/70, pulse 99, temperature 98.9 F (37.2 C), temperature source Oral, resp. rate 18,  height 5\' 8"  (1.727 m), weight 85.8 kg, SpO2 100 %. Body mass index is 28.76 kg/m.   Treatment Plan Summary: Medication management and Plan no change to medication management.  Supportive encouragement.  Continue to work on discharge [place], MD 08/27/2022, 1:41 PM

## 2022-08-27 NOTE — Progress Notes (Signed)
Pt denies SI/HI/AVH and verbally agrees to approach staff if these become apparent or before harming themselves/others. Rates depression 0/10. Rates anxiety 0/10. Rates pain 0/10. Pt has been pacing throughout the day but has caused no issues. Scheduled medications administered to pt, per MD orders. RN provided support and encouragement to pt. Q15 min safety checks implemented and continued. Pt safe on the unit. RN will continue to monitor and intervene as needed.  Problem: Education: Goal: Ability to state activities that reduce stress will improve Outcome: Progressing   Problem: Coping: Goal: Ability to identify and develop effective coping behavior will improve Outcome: Progressing     08/27/22 0815  Psych Admission Type (Psych Patients Only)  Admission Status Involuntary  Psychosocial Assessment  Patient Complaints None  Eye Contact Fair  Facial Expression Animated  Affect Appropriate to circumstance  Speech Aphasic  Interaction Childlike  Motor Activity Restless;Pacing  Appearance/Hygiene Unremarkable  Behavior Characteristics Cooperative;Appropriate to situation;Calm  Mood Pleasant  Thought Process  Coherency Unable to assess  Content UTA  Delusions UTA  Perception UTA  Hallucination UTA  Judgment Limited  Confusion None  Danger to Self  Current suicidal ideation? Denies  Danger to Others  Danger to Others None reported or observed  Danger to Others Abnormal  Harmful Behavior to others No threats or harm toward other people

## 2022-08-27 NOTE — Progress Notes (Signed)
Recreation Therapy Notes  Date: 08/27/2022  Time: 10:00 am   Location: Craft room    Behavioral response: N/A   Intervention Topic: Relaxation   Discussion/Intervention: Patient refused to attend group.   Clinical Observations/Feedback:  Patient refused to attend group.   Saraiyah Hemminger LRT/CTRS        Jerick Khachatryan 08/27/2022 11:26 AM

## 2022-08-27 NOTE — Plan of Care (Signed)
  Problem: Safety: Goal: Ability to remain free from injury will improve Outcome: Progressing   

## 2022-08-28 NOTE — Progress Notes (Signed)
Patient had an outburst of anger this shift. Walking up and down the halls yelling, not easily redirectable. Veteran was refusing medication but eventually came back to medication from room. Veteran in milieu watching tv at times. Out for snacks. Encouragement and support provided. Safety checks maintained. Medications given as prescribed. Pt receptive and remains safe on unit with q 15 min checks.

## 2022-08-28 NOTE — Progress Notes (Signed)
This writer went to go get patient for dinner, and upon walking into his room, he was sitting on the bed, in the dark with his hand on his head. This Probation officer asked patient if he was going to come down for dinner and he said "no". So, this writer proceeded to ask patient if he was going to come up for medication and he stated "no" once again. MD notified via secure chat.

## 2022-08-28 NOTE — Progress Notes (Signed)
[facility] MD Progress Note  08/28/2022 11:03 AM Adam [name]  MRN:  [id number] Subjective: No new complaints and no change in behavior and no change in presentation Principal Problem: Schizophrenia, chronic condition with acute exacerbation (HCC) Diagnosis: Principal Problem:   Schizophrenia, chronic condition with acute exacerbation (HCC)  Total Time spent with patient: 30 minutes  Past Psychiatric History: Past history of schizophrenia  Past Medical History:  Past Medical History:  Diagnosis Date   Myocardial infarction (HCC)    Schizophrenia (HCC)    Stroke (HCC)    TBI (traumatic brain injury) (HCC)    History reviewed. No pertinent surgical history. Family History: History reviewed. No pertinent family history. Family Psychiatric  History: See previous Social History:  Social History   Substance and Sexual Activity  Alcohol Use Not Currently     Social History   Substance and Sexual Activity  Drug Use Not Currently    Social History   Socioeconomic History   Marital status: Single    Spouse name: Not on file   Number of children: Not on file   Years of education: Not on file   Highest education level: Not on file  Occupational History   Not on file  Tobacco Use   Smoking status: Never    Passive exposure: Never   Smokeless tobacco: Never  Vaping Use   Vaping Use: Unknown  Substance and Sexual Activity   Alcohol use: Not Currently   Drug use: Not Currently   Sexual activity: Not Currently  Other Topics Concern   Not on file  Social History Narrative   Not on file   Social Determinants of Health   Financial Resource Strain: Not on file  Food Insecurity: Not on file  Transportation Needs: Not on file  Physical Activity: Not on file  Stress: Not on file  Social Connections: Not on file   Additional Social History:  Specify valuables returned:  (none)                      Sleep: Fair  Appetite:  Fair  Current Medications: Current  Facility-Administered Medications  Medication Dose Route Frequency Provider Last Rate Last Admin   acetaminophen (TYLENOL) tablet 650 mg  650 mg Oral Q6H PRN Shirlie Enck T, MD   650 mg at 08/18/22 0028   alum & mag hydroxide-simeth (MAALOX/MYLANTA) 200-200-20 MG/5ML suspension 30 mL  30 mL Oral Q4H PRN Breylin Dom T, MD   30 mL at 04/26/22 6295   aspirin EC tablet 81 mg  81 mg Oral Daily Humzah Harty, [name], MD   81 mg at 08/28/22 0825   atorvastatin (LIPITOR) tablet 20 mg  20 mg Oral Daily Tangi Shroff T, MD   20 mg at 08/28/22 0825   atropine 1 % ophthalmic solution 1 drop  1 drop Sublingual QID [name], MD   1 drop at 08/28/22 0825   benztropine (COGENTIN) tablet 0.5 mg  0.5 mg Oral BID Tarl Cephas T, MD   0.5 mg at 08/28/22 0825   clonazePAM (KLONOPIN) tablet 1 mg  1 mg Oral TID PRN He, [name], MD   1 mg at 08/24/22 2035   cloZAPine (CLOZARIL) tablet 300 mg  300 mg Oral QHS Quintasha Gren T, MD   300 mg at 08/27/22 2107   diphenhydrAMINE (BENADRYL) capsule 50 mg  50 mg Oral Q6H PRN Denard Tuminello, [name], MD   50 mg at 08/21/22 0012   Or   diphenhydrAMINE (BENADRYL) injection 50  mg  50 mg Intramuscular Q6H PRN Shatoria Stooksbury T, MD       feeding supplement (ENSURE ENLIVE / ENSURE PLUS) liquid 237 mL  237 mL Oral TID BM Terressa Evola T, MD   237 mL at 08/27/22 2108   gabapentin (NEURONTIN) capsule 300 mg  300 mg Oral TID Tregan Read, [name], MD   300 mg at 08/28/22 0825   haloperidol (HALDOL) tablet 2 mg  2 mg Oral TID Laporcha Marchesi, [name], MD   2 mg at 08/28/22 0825   haloperidol (HALDOL) tablet 5 mg  5 mg Oral Q6H PRN Garreth Burnsworth, [name], MD   5 mg at 08/21/22 0013   Or   haloperidol lactate (HALDOL) injection 5 mg  5 mg Intramuscular Q6H PRN Paige Vanderwoude T, MD       lisinopril (ZESTRIL) tablet 5 mg  5 mg Oral Daily Jeniya Flannigan T, MD   5 mg at 08/28/22 0825   magnesium hydroxide (MILK OF MAGNESIA) suspension 30 mL  30 mL Oral Daily PRN Joseantonio Dittmar T, MD   30 mL at 05/02/22 5053   metoprolol succinate  (TOPROL-XL) 24 hr tablet 25 mg  25 mg Oral Daily Kyelle Urbas T, MD   25 mg at 08/28/22 0825   ondansetron (ZOFRAN) tablet 4 mg  4 mg Oral Q8H PRN Jearld Lesch, NP   4 mg at 08/18/22 0052   paliperidone (INVEGA SUSTENNA) injection 156 mg  156 mg Intramuscular Q28 days Yliana Gravois, [name], MD   156 mg at 08/16/22 0833   temazepam (RESTORIL) capsule 15 mg  15 mg Oral QHS Demere Dotzler T, MD   15 mg at 08/27/22 2108   ziprasidone (GEODON) injection 20 mg  20 mg Intramuscular Q12H PRN Yvonnia Tango, [name], MD   20 mg at 07/08/22 1453    Lab Results: No results found for this or any previous visit (from the past 48 hour(s)).  Blood Alcohol level:  Lab Results  Component Value Date   ETH <10 07/20/2021    Metabolic Disorder Labs: Lab Results  Component Value Date   HGBA1C 5.5 04/10/2022   MPG 111.15 04/10/2022   No results found for: "PROLACTIN" Lab Results  Component Value Date   CHOL 167 11/20/2021   TRIG 107 11/20/2021   HDL 26 (L) 11/20/2021   CHOLHDL 6.4 11/20/2021   VLDL 21 11/20/2021   LDLCALC 120 (H) 11/20/2021    Physical Findings: AIMS: Facial and Oral Movements Muscles of Facial Expression: None, normal Lips and Perioral Area: None, normal Jaw: None, normal Tongue: None, normal,Extremity Movements Upper (arms, wrists, hands, fingers): None, normal Lower (legs, knees, ankles, toes): None, normal, Trunk Movements Neck, shoulders, hips: None, normal, Overall Severity Severity of abnormal movements (highest score from questions above): None, normal Incapacitation due to abnormal movements: None, normal Patient's awareness of abnormal movements (rate only patient's report): No Awareness, Dental Status Current problems with teeth and/or dentures?: No Does patient usually wear dentures?: No  CIWA:    COWS:     Musculoskeletal: Strength & Muscle Tone: within normal limits Gait & Station: normal Patient leans: N/A  Psychiatric Specialty Exam:  Presentation  General  Appearance: Appropriate for Environment; Casual; Neat; Well Groomed  Eye Contact:Fair  Speech:Slow  Speech Volume:Decreased  Handedness:Left   Mood and Affect  Mood:Anxious  Affect:Congruent   Thought Process  Thought Processes:Goal Directed  Descriptions of Associations:Circumstantial  Orientation:Partial  Thought Content:Scattered  History of Schizophrenia/Schizoaffective disorder:Yes  Duration of Psychotic Symptoms:Greater than six  months  Hallucinations:No data recorded Ideas of Reference:None  Suicidal Thoughts:No data recorded Homicidal Thoughts:No data recorded  Sensorium  Memory:Remote Good  Judgment:Fair  Insight:Fair   Executive Functions  Concentration:Fair  Attention Span:Fair  [place]   Psychomotor Activity  Psychomotor Activity:No data recorded  Assets  Assets:No data recorded  Sleep  Sleep:No data recorded   Physical Exam: Physical Exam Vitals and nursing note reviewed.  Constitutional:      Appearance: Normal appearance.  HENT:     Head: Normocephalic and atraumatic.     Mouth/Throat:     Pharynx: Oropharynx is clear.  Eyes:     Pupils: Pupils are equal, round, and reactive to light.  Cardiovascular:     Rate and Rhythm: Normal rate and regular rhythm.  Pulmonary:     Effort: Pulmonary effort is normal.     Breath sounds: Normal breath sounds.  Abdominal:     General: Abdomen is flat.     Palpations: Abdomen is soft.  Musculoskeletal:        General: Normal range of motion.  Skin:    General: Skin is warm and dry.  Neurological:     General: No focal deficit present.     Mental Status: He is alert. Mental status is at baseline.  Psychiatric:        Mood and Affect: Mood normal.        Thought Content: Thought content normal.    Review of Systems  Unable to perform ROS: Language  Neurological: Negative.    Blood pressure 103/70, pulse 99, temperature 98.9 F  (37.2 C), temperature source Oral, resp. rate 18, height 5\' 8"  (1.727 m), weight 85.8 kg, SpO2 100 %. Body mass index is 28.76 kg/m.   Treatment Plan Summary: Plan completely stable.  No change to behavior complaints.  Cooperative with treatment.  Continue to work on discharge [place], MD 08/28/2022, 11:03 AM

## 2022-08-28 NOTE — Progress Notes (Signed)
Patient came up and sat in the chair outside of the medication room. Patient then walks off and goes back to the dayroom on his hall to watch TV. This Probation officer went and asked patient if he was going to take his medication and he said "yeah, no, yeah". This Probation officer proceeds to ask patient if he wants this Probation officer to bring the medication to him and he said "yeah". This Probation officer took patient his scheduled evening medication, in which he tolerated it well. There was no instance that he spit the medication back into the cup this go round. Patient remains safe on the unit and will continue to be monitored by staff.

## 2022-08-28 NOTE — Progress Notes (Signed)
Patient calm and pleasant during assessment denying SI/HI/AVH. Pt observed interacting appropriately with staff and peers on the unit. Pt compliant with medication administration per MD orders. Pt given education, support, and encouragement to be active in his treatment plan. Pt being monitored Q 15 minutes for safety per unit protocol, remains safe on the unit  

## 2022-08-28 NOTE — Plan of Care (Addendum)
D- Patient alert and oriented to person, place, and situation. Patient presented in a pleasant mood on assessment. 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 With the help of staff, patient filled out his self-inventory. Patient denies SI, HI, AVH, and pain at this time. Patient also denied any signs/symptoms of depression and anxiety. Patient had no stated goals for today.  A- Some scheduled medications administered to patient, per MD orders. Support and encouragement provided.  Routine safety checks conducted every 15 minutes.  Patient informed to notify staff with problems or concerns.  R- No adverse drug reactions noted. Patient contracts for safety at this time. Patient compliant with medications and treatment plan. Patient receptive, calm, and cooperative. Patient isolates to room, except for meals and medication. Patient is present in the milieu, and when he gets tired of being around the other patients, he goes to his room and entertains himself with his personal cell phone, that was approved by leadership for him to have. Patient remains safe at this time.  Problem: Education: Goal: Ability to state activities that reduce stress will improve Outcome: Progressing   Problem: Coping: Goal: Ability to identify and develop effective coping behavior will improve Outcome: Progressing   Problem: Self-Concept: Goal: Ability to identify factors that promote anxiety will improve Outcome: Progressing Goal: Level of anxiety will decrease Outcome: Progressing Goal: Ability to modify response to factors that promote anxiety will improve Outcome: Progressing   Problem: Education: Goal: Knowledge of General Education information will improve Description: Including pain rating scale, medication(s)/side effects and non-pharmacologic comfort measures Outcome: Progressing   Problem: Health Behavior/Discharge Planning: Goal: Ability to manage health-related needs will  improve Outcome: Progressing   Problem: Clinical Measurements: Goal: Ability to maintain clinical measurements within normal limits will improve Outcome: Progressing Goal: Will remain free from infection Outcome: Progressing Goal: Diagnostic test results will improve Outcome: Progressing Goal: Respiratory complications will improve Outcome: Progressing Goal: Cardiovascular complication will be avoided Outcome: Progressing   Problem: Activity: Goal: Risk for activity intolerance will decrease Outcome: Progressing   Problem: Nutrition: Goal: Adequate nutrition will be maintained Outcome: Progressing   Problem: Coping: Goal: Level of anxiety will decrease Outcome: Progressing   Problem: Elimination: Goal: Will not experience complications related to bowel motility Outcome: Progressing Goal: Will not experience complications related to urinary retention Outcome: Progressing   Problem: Pain Managment: Goal: General experience of comfort will improve Outcome: Progressing   Problem: Safety: Goal: Ability to remain free from injury will improve Outcome: Progressing   Problem: Skin Integrity: Goal: Risk for impaired skin integrity will decrease Outcome: Progressing   Problem: Activity: Goal: Interest or engagement in leisure activities will improve Outcome: Progressing   Problem: Coping: Goal: Coping ability will improve Outcome: Progressing   Problem: Education: Goal: Will be free of psychotic symptoms Outcome: Progressing

## 2022-08-28 NOTE — Plan of Care (Signed)
  Problem: Coping: Goal: Ability to identify and develop effective coping behavior will improve Outcome: Progressing   

## 2022-08-28 NOTE — Progress Notes (Addendum)
During morning medication administration, patient came into the medication room as if he was going to take his medication. Patient took the medication cup and and drank the water afterwards. Patient threw the water cup into the trash, and waited for this writer to place the sublingual drops in his mouth. After patient left out of the medication room, this writer went to check the trash, and patient had sure enough spit some of his medication out in the cup. Specifically, his scheduled Haldol and Gabapentin. MD will be notified.

## 2022-08-29 NOTE — Progress Notes (Signed)
Pt denies SI/HI/AVH and verbally agrees to approach staff if these become apparent or before harming themselves/others. Rates depression 0/10. Rates anxiety 0/10. Rates pain 0/10. Pt has gotten upset a few times throughout the day but eventually gets back into his happy mood. No issues with medications.  He has been out for most of the day. Scheduled medications administered to pt, per MD orders. RN provided support and encouragement to pt. Q15 min safety checks implemented and continued. Pt safe on the unit. RN will continue to monitor and intervene as needed.   Problem: Self-Concept: Goal: Level of anxiety will decrease Outcome: Progressing   Problem: Education: Goal: Knowledge of General Education information will improve Description: Including pain rating scale, medication(s)/side effects and non-pharmacologic comfort measures Outcome: Progressing    08/29/22 0738  Psych Admission Type (Psych Patients Only)  Admission Status Involuntary  Psychosocial Assessment  Patient Complaints None  Eye Contact Fair  Facial Expression Animated  Affect Appropriate to circumstance  Speech Aphasic  Interaction Assertive  Motor Activity Slow  Appearance/Hygiene Unremarkable  Behavior Characteristics Cooperative;Appropriate to situation;Calm  Mood Pleasant  Thought Process  Coherency Circumstantial  Content WDL  Delusions None reported or observed  Perception WDL  Hallucination None reported or observed  Judgment Limited  Confusion None  Danger to Self  Current suicidal ideation? Denies  Danger to Others  Danger to Others None reported or observed  Danger to Others Abnormal  Harmful Behavior to others No threats or harm toward other people  Destructive Behavior No threats or harm toward property

## 2022-08-29 NOTE — TOC Progression Note (Signed)
Transition of Care [facility]) - Progression Note    Patient Details  Name: Adam [name] MRN: [id number] Date of Birth: 1961/08/04  Transition of Care [facility]) CM/SW [place], LCSW Phone Number: 08/29/2022, 4:24 PM  Clinical Narrative:   Received call from [place] asking about communication device patient was going to use a few months ago. Checked with psychiatrist and provided update.  Expected Discharge Plan and Services                                                 Social Determinants of Health (SDOH) Interventions    Readmission Risk Interventions     No data to display

## 2022-08-29 NOTE — Plan of Care (Signed)
  Problem: Education: Goal: Ability to state activities that reduce stress will improve Outcome: Progressing   Problem: Coping: Goal: Ability to identify and develop effective coping behavior will improve Outcome: Progressing   Problem: Self-Concept: Goal: Ability to identify factors that promote anxiety will improve Outcome: Progressing Goal: Level of anxiety will decrease Outcome: Progressing Goal: Ability to modify response to factors that promote anxiety will improve Outcome: Progressing   Problem: Education: Goal: Will be free of psychotic symptoms Outcome: Progressing   Problem: Coping: Goal: Coping ability will improve Outcome: Progressing   Problem: Coping: Goal: Coping ability will improve Outcome: Progressing   Problem: Coping: Goal: Coping ability will improve Outcome: Progressing   Problem: Coping: Goal: Coping ability will improve Outcome: Progressing

## 2022-08-29 NOTE — Progress Notes (Signed)
[facility] MD Progress Note  08/29/2022 2:36 PM Adam [name]  MRN:  [id number] Subjective: Follow-up patient with schizophrenia.  No new complaints.  Yesterday was in a bad mood in the afternoon and was not eating but has gone back to eating today and is generally compliant with medicine Principal Problem: Schizophrenia, chronic condition with acute exacerbation (HCC) Diagnosis: Principal Problem:   Schizophrenia, chronic condition with acute exacerbation (HCC)  Total Time spent with patient: 20 minutes  Past Psychiatric History: Past history of schizophrenia  Past Medical History:  Past Medical History:  Diagnosis Date   Myocardial infarction (HCC)    Schizophrenia (HCC)    Stroke (HCC)    TBI (traumatic brain injury) (HCC)    History reviewed. No pertinent surgical history. Family History: History reviewed. No pertinent family history. Family Psychiatric  History: See previous Social History:  Social History   Substance and Sexual Activity  Alcohol Use Not Currently     Social History   Substance and Sexual Activity  Drug Use Not Currently    Social History   Socioeconomic History   Marital status: Single    Spouse name: Not on file   Number of children: Not on file   Years of education: Not on file   Highest education level: Not on file  Occupational History   Not on file  Tobacco Use   Smoking status: Never    Passive exposure: Never   Smokeless tobacco: Never  Vaping Use   Vaping Use: Unknown  Substance and Sexual Activity   Alcohol use: Not Currently   Drug use: Not Currently   Sexual activity: Not Currently  Other Topics Concern   Not on file  Social History Narrative   Not on file   Social Determinants of Health   Financial Resource Strain: Not on file  Food Insecurity: Not on file  Transportation Needs: Not on file  Physical Activity: Not on file  Stress: Not on file  Social Connections: Not on file   Additional Social History:  Specify valuables  returned:  (none)                      Sleep: Fair  Appetite:  Fair  Current Medications: Current Facility-Administered Medications  Medication Dose Route Frequency Provider Last Rate Last Admin   acetaminophen (TYLENOL) tablet 650 mg  650 mg Oral Q6H PRN Eudelia Hiltunen T, MD   650 mg at 08/18/22 0028   alum & mag hydroxide-simeth (MAALOX/MYLANTA) 200-200-20 MG/5ML suspension 30 mL  30 mL Oral Q4H PRN Raylei Losurdo T, MD   30 mL at 04/26/22 9833   aspirin EC tablet 81 mg  81 mg Oral Daily Neville Walston, [name], MD   81 mg at 08/29/22 0738   atorvastatin (LIPITOR) tablet 20 mg  20 mg Oral Daily Tyriek Hofman T, MD   20 mg at 08/29/22 0738   atropine 1 % ophthalmic solution 1 drop  1 drop Sublingual QID [name], MD   1 drop at 08/29/22 1112   benztropine (COGENTIN) tablet 0.5 mg  0.5 mg Oral BID Lankford Gutzmer T, MD   0.5 mg at 08/29/22 0738   clonazePAM (KLONOPIN) tablet 1 mg  1 mg Oral TID PRN He, [name], MD   1 mg at 08/24/22 2035   cloZAPine (CLOZARIL) tablet 300 mg  300 mg Oral QHS Novaleigh Kohlman T, MD   300 mg at 08/28/22 2057   diphenhydrAMINE (BENADRYL) capsule 50 mg  50 mg Oral  Q6H PRN Gevon Markus, [name], MD   50 mg at 08/21/22 0012   Or   diphenhydrAMINE (BENADRYL) injection 50 mg  50 mg Intramuscular Q6H PRN Kamauri Kathol T, MD       feeding supplement (ENSURE ENLIVE / ENSURE PLUS) liquid 237 mL  237 mL Oral TID BM Hadlee Burback T, MD   237 mL at 08/29/22 1335   gabapentin (NEURONTIN) capsule 300 mg  300 mg Oral TID Treasa Bradshaw T, MD   300 mg at 08/29/22 1112   haloperidol (HALDOL) tablet 2 mg  2 mg Oral TID Zamorah Ailes, [name], MD   2 mg at 08/29/22 1112   haloperidol (HALDOL) tablet 5 mg  5 mg Oral Q6H PRN Kadijah Shamoon, [name], MD   5 mg at 08/21/22 0013   Or   haloperidol lactate (HALDOL) injection 5 mg  5 mg Intramuscular Q6H PRN Katrese Shell T, MD       lisinopril (ZESTRIL) tablet 5 mg  5 mg Oral Daily Sricharan Lacomb T, MD   5 mg at 08/29/22 0738   magnesium hydroxide (MILK OF  MAGNESIA) suspension 30 mL  30 mL Oral Daily PRN Pruitt Taboada T, MD   30 mL at 05/02/22 0627   metoprolol succinate (TOPROL-XL) 24 hr tablet 25 mg  25 mg Oral Daily Kevia Zaucha T, MD   25 mg at 08/29/22 0738   ondansetron (ZOFRAN) tablet 4 mg  4 mg Oral Q8H PRN Lerry Liner M, NP   4 mg at 08/18/22 0052   paliperidone (INVEGA SUSTENNA) injection 156 mg  156 mg Intramuscular Q28 days Carl Butner, [name], MD   156 mg at 08/16/22 0833   temazepam (RESTORIL) capsule 15 mg  15 mg Oral QHS Shanon Seawright T, MD   15 mg at 08/28/22 2057   ziprasidone (GEODON) injection 20 mg  20 mg Intramuscular Q12H PRN Damarri Rampy, [name], MD   20 mg at 07/08/22 1453    Lab Results: No results found for this or any previous visit (from the past 48 hour(s)).  Blood Alcohol level:  Lab Results  Component Value Date   ETH <10 07/20/2021    Metabolic Disorder Labs: Lab Results  Component Value Date   HGBA1C 5.5 04/10/2022   MPG 111.15 04/10/2022   No results found for: "PROLACTIN" Lab Results  Component Value Date   CHOL 167 11/20/2021   TRIG 107 11/20/2021   HDL 26 (L) 11/20/2021   CHOLHDL 6.4 11/20/2021   VLDL 21 11/20/2021   LDLCALC 120 (H) 11/20/2021    Physical Findings: AIMS: Facial and Oral Movements Muscles of Facial Expression: None, normal Lips and Perioral Area: None, normal Jaw: None, normal Tongue: None, normal,Extremity Movements Upper (arms, wrists, hands, fingers): None, normal Lower (legs, knees, ankles, toes): None, normal, Trunk Movements Neck, shoulders, hips: None, normal, Overall Severity Severity of abnormal movements (highest score from questions above): None, normal Incapacitation due to abnormal movements: None, normal Patient's awareness of abnormal movements (rate only patient's report): No Awareness, Dental Status Current problems with teeth and/or dentures?: No Does patient usually wear dentures?: No  CIWA:    COWS:     Musculoskeletal: Strength & Muscle Tone: within  normal limits Gait & Station: normal Patient leans: N/A  Psychiatric Specialty Exam:  Presentation  General Appearance: Appropriate for Environment; Casual; Neat; Well Groomed  Eye Contact:Fair  Speech:Slow  Speech Volume:Decreased  Handedness:Left   Mood and Affect  Mood:Anxious  Affect:Congruent   Thought Process  Thought Processes:Goal  Directed  Descriptions of Associations:Circumstantial  Orientation:Partial  Thought Content:Scattered  History of Schizophrenia/Schizoaffective disorder:Yes  Duration of Psychotic Symptoms:Greater than six months  Hallucinations:No data recorded Ideas of Reference:None  Suicidal Thoughts:No data recorded Homicidal Thoughts:No data recorded  Sensorium  Memory:Remote Good  Judgment:Fair  Insight:Fair   Executive Functions  Concentration:Fair  Attention Span:Fair  [place]   Psychomotor Activity  Psychomotor Activity:No data recorded  Assets  Assets:No data recorded  Sleep  Sleep:No data recorded   Physical Exam: Physical Exam Vitals reviewed.  Constitutional:      Appearance: Normal appearance.  HENT:     Head: Normocephalic and atraumatic.     Mouth/Throat:     Pharynx: Oropharynx is clear.  Eyes:     Pupils: Pupils are equal, round, and reactive to light.  Cardiovascular:     Rate and Rhythm: Normal rate and regular rhythm.  Pulmonary:     Effort: Pulmonary effort is normal.     Breath sounds: Normal breath sounds.  Abdominal:     General: Abdomen is flat.     Palpations: Abdomen is soft.  Musculoskeletal:        General: Normal range of motion.  Skin:    General: Skin is warm and dry.  Neurological:     General: No focal deficit present.     Mental Status: He is alert. Mental status is at baseline.  Psychiatric:        Attention and Perception: Attention normal.        Mood and Affect: Mood normal.        Speech: He is noncommunicative.         Thought Content: Thought content normal.    Review of Systems  Unable to perform ROS: Language   Blood pressure (!) 137/94, pulse 91, temperature 98.9 F (37.2 C), temperature source Oral, resp. rate 20, height 5\' 8"  (1.727 m), weight 85.8 kg, SpO2 100 %. Body mass index is 28.76 kg/m.   Treatment Plan Summary: Plan no change to medication.  Encouragement and support patient.  Updated him on our efforts to write a letter to getting placed somewhere.  Alethia Berthold, MD 08/29/2022, 2:36 PM

## 2022-08-29 NOTE — Progress Notes (Signed)
Patient at the nurses station asking to get shaved.  In a pleasant and cooperative mood.  Denies si  hi  avh depression and anxiety.  Reports eating and sleeping well.   Was told that we were in the start of the shift and getting report and would have to wait.  Patient was excepting of this.  Will continue to monitor with q 15 safety checks.     C Butler-Nicholson, LPN

## 2022-08-30 NOTE — Progress Notes (Signed)
Pleasant and cooperative. He received his qhs meds without issue. Will continue to monitor with q 15 minute safety checks.    C Butler-Nicholson, LPN

## 2022-08-30 NOTE — Progress Notes (Signed)
Patient calm and pleasant during assessment denying SI/HI/AVH. Pt observed interacting appropriately with staff and peers on the unit. Pt compliant with medication administration per MD orders. Pt given education, support, and encouragement to be active in his treatment plan. Pt being monitored Q 15 minutes for safety per unit protocol, remains safe on the unit  

## 2022-08-30 NOTE — [facility] Group Notes (Signed)
[facility] Group Notes:  (Nursing/MHT/Case Management/Adjunct)  Date:  08/30/2022  Time:  11:17 AM  Type of Therapy:   community meeting  Participation Level:  Did Not Attend    Adam [name] 08/30/2022, 11:17 AM

## 2022-08-30 NOTE — Progress Notes (Signed)
This Probation officer was notified by Jenny Reichmann, MHT that patient refused evening vitals. This Probation officer will notify oncoming shift of this as well.

## 2022-08-30 NOTE — Progress Notes (Signed)
Recreation Therapy Notes  Date: 08/30/2022   Time: 10:50 am   Location: Craft room    Behavioral response: N/A   Intervention Topic: Decision Making    Discussion/Intervention: Patient refused to attend group.   Clinical Observations/Feedback:  Patient refused to attend group.   Omega Durante LRT/CTRS          Andrika Peraza 08/30/2022 11:03 AM 

## 2022-08-30 NOTE — Plan of Care (Signed)
D- Patient alert and oriented to person, situation, and place. Patient presented in a preoccupied, but pleasant mood on assessment, fixated on his leg tattoo. 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 Patient was pointing to his tattoo saying "Illuminati, Illuminati". Patient also motioned for this writer to follow him to his dayroom, to write "Illuminati" on the white board, and when this writer did that, he smiled and laughed. With the help of staff, patient filled out his self-inventory. Patient denies SI, HI, AVH, and pain at this time. Patient also denies any signs/symptoms of depression and anxiety. Patient had no stated goals for today.  A- Scheduled medications administered to patient, per MD orders. Support and encouragement provided.  Routine safety checks conducted every 15 minutes.  Patient informed to notify staff with problems or concerns.  R- No adverse drug reactions noted. Patient contracts for safety at this time. Patient compliant with medications and treatment plan. Patient has been more present in the milieu today, and when he goes to his room and entertains himself with his personal cell phone, that was approved by leadership for him to have. Patient receptive, calm, and cooperative. Patient interacts well with others on the unit.  Patient remains safe at this time.  Problem: Education: Goal: Ability to state activities that reduce stress will improve Outcome: Progressing   Problem: Coping: Goal: Ability to identify and develop effective coping behavior will improve Outcome: Progressing   Problem: Self-Concept: Goal: Ability to identify factors that promote anxiety will improve Outcome: Progressing Goal: Level of anxiety will decrease Outcome: Progressing Goal: Ability to modify response to factors that promote anxiety will improve Outcome: Progressing   Problem: Education: Goal: Knowledge of General Education information will  improve Description: Including pain rating scale, medication(s)/side effects and non-pharmacologic comfort measures Outcome: Progressing   Problem: Health Behavior/Discharge Planning: Goal: Ability to manage health-related needs will improve Outcome: Progressing   Problem: Clinical Measurements: Goal: Ability to maintain clinical measurements within normal limits will improve Outcome: Progressing Goal: Will remain free from infection Outcome: Progressing Goal: Diagnostic test results will improve Outcome: Progressing Goal: Respiratory complications will improve Outcome: Progressing Goal: Cardiovascular complication will be avoided Outcome: Progressing   Problem: Activity: Goal: Risk for activity intolerance will decrease Outcome: Progressing   Problem: Nutrition: Goal: Adequate nutrition will be maintained Outcome: Progressing   Problem: Coping: Goal: Level of anxiety will decrease Outcome: Progressing   Problem: Elimination: Goal: Will not experience complications related to bowel motility Outcome: Progressing Goal: Will not experience complications related to urinary retention Outcome: Progressing   Problem: Pain Managment: Goal: General experience of comfort will improve Outcome: Progressing   Problem: Safety: Goal: Ability to remain free from injury will improve Outcome: Progressing   Problem: Skin Integrity: Goal: Risk for impaired skin integrity will decrease Outcome: Progressing   Problem: Activity: Goal: Interest or engagement in leisure activities will improve Outcome: Progressing   Problem: Coping: Goal: Coping ability will improve Outcome: Progressing   Problem: Education: Goal: Will be free of psychotic symptoms Outcome: Progressing

## 2022-08-30 NOTE — Progress Notes (Signed)
[facility] MD Progress Note  08/30/2022 1:28 PM Adam [name]  MRN:  [id number] Subjective: Follow-up 61 year old man who has literally been in our hospital over a year now.  No change in behavior or presentation. Principal Problem: Schizophrenia, chronic condition with acute exacerbation (HCC) Diagnosis: Principal Problem:   Schizophrenia, chronic condition with acute exacerbation (HCC)  Total Time spent with patient: 30 minutes  Past Psychiatric History: History of schizophrenia  Past Medical History:  Past Medical History:  Diagnosis Date   Myocardial infarction (HCC)    Schizophrenia (HCC)    Stroke (HCC)    TBI (traumatic brain injury) (HCC)    History reviewed. No pertinent surgical history. Family History: History reviewed. No pertinent family history. Family Psychiatric  History: See previous Social History:  Social History   Substance and Sexual Activity  Alcohol Use Not Currently     Social History   Substance and Sexual Activity  Drug Use Not Currently    Social History   Socioeconomic History   Marital status: Single    Spouse name: Not on file   Number of children: Not on file   Years of education: Not on file   Highest education level: Not on file  Occupational History   Not on file  Tobacco Use   Smoking status: Never    Passive exposure: Never   Smokeless tobacco: Never  Vaping Use   Vaping Use: Unknown  Substance and Sexual Activity   Alcohol use: Not Currently   Drug use: Not Currently   Sexual activity: Not Currently  Other Topics Concern   Not on file  Social History Narrative   Not on file   Social Determinants of Health   Financial Resource Strain: Not on file  Food Insecurity: Not on file  Transportation Needs: Not on file  Physical Activity: Not on file  Stress: Not on file  Social Connections: Not on file   Additional Social History:  Specify valuables returned:  (none)                      Sleep: Fair  Appetite:   Fair  Current Medications: Current Facility-Administered Medications  Medication Dose Route Frequency Provider Last Rate Last Admin   acetaminophen (TYLENOL) tablet 650 mg  650 mg Oral Q6H PRN Tafari Humiston T, MD   650 mg at 08/18/22 0028   alum & mag hydroxide-simeth (MAALOX/MYLANTA) 200-200-20 MG/5ML suspension 30 mL  30 mL Oral Q4H PRN Jeneal Vogl T, MD   30 mL at 04/26/22 6387   aspirin EC tablet 81 mg  81 mg Oral Daily Supriya Beaston, [name], MD   81 mg at 08/30/22 0814   atorvastatin (LIPITOR) tablet 20 mg  20 mg Oral Daily Deamonte Sayegh T, MD   20 mg at 08/30/22 0814   atropine 1 % ophthalmic solution 1 drop  1 drop Sublingual QID [name], MD   1 drop at 08/30/22 1150   benztropine (COGENTIN) tablet 0.5 mg  0.5 mg Oral BID Allanna Bresee T, MD   0.5 mg at 08/30/22 0814   clonazePAM (KLONOPIN) tablet 1 mg  1 mg Oral TID PRN He, [name], MD   1 mg at 08/24/22 2035   cloZAPine (CLOZARIL) tablet 300 mg  300 mg Oral QHS Hafiz Irion T, MD   300 mg at 08/29/22 2049   diphenhydrAMINE (BENADRYL) capsule 50 mg  50 mg Oral Q6H PRN Laurie Penado, [name], MD   50 mg at 08/21/22 0012  Or   diphenhydrAMINE (BENADRYL) injection 50 mg  50 mg Intramuscular Q6H PRN Shateka Petrea T, MD       feeding supplement (ENSURE ENLIVE / ENSURE PLUS) liquid 237 mL  237 mL Oral TID BM Shayma Pfefferle T, MD   237 mL at 08/30/22 1034   gabapentin (NEURONTIN) capsule 300 mg  300 mg Oral TID Tamsin Nader T, MD   300 mg at 08/30/22 1150   haloperidol (HALDOL) tablet 2 mg  2 mg Oral TID Johnnette Laux, [name], MD   2 mg at 08/30/22 1149   haloperidol (HALDOL) tablet 5 mg  5 mg Oral Q6H PRN Shanea Karney, [name], MD   5 mg at 08/21/22 0013   Or   haloperidol lactate (HALDOL) injection 5 mg  5 mg Intramuscular Q6H PRN Mairin Lindsley T, MD       lisinopril (ZESTRIL) tablet 5 mg  5 mg Oral Daily Dominion Kathan T, MD   5 mg at 08/30/22 0814   magnesium hydroxide (MILK OF MAGNESIA) suspension 30 mL  30 mL Oral Daily PRN Keya Wynes T, MD   30 mL at  05/02/22 0627   metoprolol succinate (TOPROL-XL) 24 hr tablet 25 mg  25 mg Oral Daily Isiah Scheel T, MD   25 mg at 08/30/22 0814   ondansetron (ZOFRAN) tablet 4 mg  4 mg Oral Q8H PRN [name], NP   4 mg at 08/18/22 0052   paliperidone (INVEGA SUSTENNA) injection 156 mg  156 mg Intramuscular Q28 days Hartlyn Reigel, [name], MD   156 mg at 08/16/22 0833   temazepam (RESTORIL) capsule 15 mg  15 mg Oral QHS Sandra Brents T, MD   15 mg at 08/29/22 2049   ziprasidone (GEODON) injection 20 mg  20 mg Intramuscular Q12H PRN Giulietta Prokop, [name], MD   20 mg at 07/08/22 1453    Lab Results: No results found for this or any previous visit (from the past 48 hour(s)).  Blood Alcohol level:  Lab Results  Component Value Date   ETH <10 08/67/6195    Metabolic Disorder Labs: Lab Results  Component Value Date   HGBA1C 5.5 04/10/2022   MPG 111.15 04/10/2022   No results found for: "PROLACTIN" Lab Results  Component Value Date   CHOL 167 11/20/2021   TRIG 107 11/20/2021   HDL 26 (L) 11/20/2021   CHOLHDL 6.4 11/20/2021   VLDL 21 11/20/2021   LDLCALC 120 (H) 11/20/2021    Physical Findings: AIMS: Facial and Oral Movements Muscles of Facial Expression: None, normal Lips and Perioral Area: None, normal Jaw: None, normal Tongue: None, normal,Extremity Movements Upper (arms, wrists, hands, fingers): None, normal Lower (legs, knees, ankles, toes): None, normal, Trunk Movements Neck, shoulders, hips: None, normal, Overall Severity Severity of abnormal movements (highest score from questions above): None, normal Incapacitation due to abnormal movements: None, normal Patient's awareness of abnormal movements (rate only patient's report): No Awareness, Dental Status Current problems with teeth and/or dentures?: No Does patient usually wear dentures?: No  CIWA:    COWS:     Musculoskeletal: Strength & Muscle Tone: within normal limits Gait & Station: normal Patient leans: N/A  Psychiatric  Specialty Exam:  Presentation  General Appearance: Appropriate for Environment; Casual; Neat; Well Groomed  Eye Contact:Fair  Speech:Slow  Speech Volume:Decreased  Handedness:Left   Mood and Affect  Mood:Anxious  Affect:Congruent   Thought Process  Thought Processes:Goal Directed  Descriptions of Associations:Circumstantial  Orientation:Partial  Thought Content:Scattered  History of Schizophrenia/Schizoaffective disorder:Yes  Duration of Psychotic Symptoms:Greater than six months  Hallucinations:No data recorded Ideas of Reference:None  Suicidal Thoughts:No data recorded Homicidal Thoughts:No data recorded  Sensorium  Memory:Remote Good  Judgment:Fair  Insight:Fair   Executive Functions  Concentration:Fair  Attention Span:Fair  Recall:Fair  Fund of Knowledge:Fair  Language:Fair   Psychomotor Activity  Psychomotor Activity:No data recorded  Assets  Assets:No data recorded  Sleep  Sleep:No data recorded   Physical Exam: Physical Exam Vitals and nursing note reviewed.  Constitutional:      Appearance: Normal appearance.  HENT:     Head: Normocephalic and atraumatic.     Mouth/Throat:     Pharynx: Oropharynx is clear.  Eyes:     Pupils: Pupils are equal, round, and reactive to light.  Cardiovascular:     Rate and Rhythm: Normal rate and regular rhythm.  Pulmonary:     Effort: Pulmonary effort is normal.     Breath sounds: Normal breath sounds.  Abdominal:     General: Abdomen is flat.     Palpations: Abdomen is soft.  Musculoskeletal:        General: Normal range of motion.  Skin:    General: Skin is warm and dry.  Neurological:     General: No focal deficit present.     Mental Status: He is alert. Mental status is at baseline.  Psychiatric:        Attention and Perception: Attention normal.        Mood and Affect: Mood normal.        Speech: He is noncommunicative.        Thought Content: Thought content normal.     Review of Systems  Unable to perform ROS: Language   Blood pressure 129/84, pulse 92, temperature 97.6 F (36.4 C), temperature source Oral, resp. rate 19, height 5\' 8"  (1.727 m), weight 85.8 kg, SpO2 99 %. Body mass index is 28.76 kg/m.   Treatment Plan Summary: Plan no change to presentation.  Encourage patient updating him on the plan offer support.  No change to medication  , MD 08/30/2022, 1:28 PM

## 2022-08-31 NOTE — Progress Notes (Signed)
[facility] MD Progress Note  08/31/2022 10:48 AM Adam [name]  MRN:  [id number] Subjective: Follow-up 61 year old man with schizophrenia.  No change to behavior.  No new complaints.  No change to clinical presentation Principal Problem: Schizophrenia, chronic condition with acute exacerbation ([place]) Diagnosis: Principal Problem:   Schizophrenia, chronic condition with acute exacerbation ([place])  Total Time spent with patient: 30 minutes  Past Psychiatric History: Past history of schizophrenia  Past Medical History:  Past Medical History:  Diagnosis Date   Myocardial infarction ([place])    Schizophrenia ([place])    Stroke ([place])    TBI (traumatic brain injury) ([place])    History reviewed. No pertinent surgical history. Family History: History reviewed. No pertinent family history. Family Psychiatric  History: See previous Social History:  Social History   Substance and Sexual Activity  Alcohol Use Not Currently     Social History   Substance and Sexual Activity  Drug Use Not Currently    Social History   Socioeconomic History   Marital status: Single    Spouse name: Not on file   Number of children: Not on file   Years of education: Not on file   Highest education level: Not on file  Occupational History   Not on file  Tobacco Use   Smoking status: Never    Passive exposure: Never   Smokeless tobacco: Never  Vaping Use   Vaping Use: Unknown  Substance and Sexual Activity   Alcohol use: Not Currently   Drug use: Not Currently   Sexual activity: Not Currently  Other Topics Concern   Not on file  Social History Narrative   Not on file   Social Determinants of Health   Financial Resource Strain: Not on file  Food Insecurity: Not on file  Transportation Needs: Not on file  Physical Activity: Not on file  Stress: Not on file  Social Connections: Not on file   Additional Social History:  Specify valuables returned:  (none)                      Sleep:  Fair  Appetite:  Fair  Current Medications: Current Facility-Administered Medications  Medication Dose Route Frequency Provider Last Rate Last Admin   acetaminophen (TYLENOL) tablet 650 mg  650 mg Oral Q6H PRN Cebastian Neis T, MD   650 mg at 08/18/22 0028   alum & mag hydroxide-simeth (MAALOX/MYLANTA) 200-200-20 MG/5ML suspension 30 mL  30 mL Oral Q4H PRN Glorya Bartley T, MD   30 mL at 04/26/22 [id number]   aspirin EC tablet 81 mg  81 mg Oral Daily Kasandra Fehr, [name], MD   81 mg at 08/30/22 0814   atorvastatin (LIPITOR) tablet 20 mg  20 mg Oral Daily Taneya Conkel T, MD   20 mg at 08/30/22 0814   atropine 1 % ophthalmic solution 1 drop  1 drop Sublingual QID [name], MD   1 drop at 08/30/22 2110   benztropine (COGENTIN) tablet 0.5 mg  0.5 mg Oral BID Alannie Amodio T, MD   0.5 mg at 08/30/22 1645   clonazePAM (KLONOPIN) tablet 1 mg  1 mg Oral TID PRN He, [name], MD   1 mg at 08/24/22 2035   cloZAPine (CLOZARIL) tablet 300 mg  300 mg Oral QHS Sherrill Buikema T, MD   300 mg at 08/30/22 2107   diphenhydrAMINE (BENADRYL) capsule 50 mg  50 mg Oral Q6H PRN Jianna Drabik, [name], MD   50 mg at 08/21/22 0012  Or   diphenhydrAMINE (BENADRYL) injection 50 mg  50 mg Intramuscular Q6H PRN Anahi Belmar T, MD       feeding supplement (ENSURE ENLIVE / ENSURE PLUS) liquid 237 mL  237 mL Oral TID BM Sherry Blackard T, MD   237 mL at 08/30/22 2133   gabapentin (NEURONTIN) capsule 300 mg  300 mg Oral TID Maxima Skelton T, MD   300 mg at 08/30/22 1645   haloperidol (HALDOL) tablet 2 mg  2 mg Oral TID Raymon Schlarb, [name], MD   2 mg at 08/30/22 1645   haloperidol (HALDOL) tablet 5 mg  5 mg Oral Q6H PRN Laurelle Skiver, [name], MD   5 mg at 08/21/22 0013   Or   haloperidol lactate (HALDOL) injection 5 mg  5 mg Intramuscular Q6H PRN Blanchard Willhite T, MD       lisinopril (ZESTRIL) tablet 5 mg  5 mg Oral Daily Nayelly Laughman T, MD   5 mg at 08/30/22 0814   magnesium hydroxide (MILK OF MAGNESIA) suspension 30 mL  30 mL Oral Daily PRN Deisi Salonga  T, MD   30 mL at 05/02/22 0627   metoprolol succinate (TOPROL-XL) 24 hr tablet 25 mg  25 mg Oral Daily Graviel Payeur T, MD   25 mg at 08/30/22 0814   ondansetron (ZOFRAN) tablet 4 mg  4 mg Oral Q8H PRN [name], NP   4 mg at 08/18/22 0052   paliperidone (INVEGA SUSTENNA) injection 156 mg  156 mg Intramuscular Q28 days Kristina Mcnorton, [name], MD   156 mg at 08/16/22 0833   temazepam (RESTORIL) capsule 15 mg  15 mg Oral QHS Gene Glazebrook T, MD   15 mg at 08/30/22 2107   ziprasidone (GEODON) injection 20 mg  20 mg Intramuscular Q12H PRN Ezekeil Bethel, [name], MD   20 mg at 07/08/22 1453    Lab Results: No results found for this or any previous visit (from the past 48 hour(s)).  Blood Alcohol level:  Lab Results  Component Value Date   ETH <10 [id number]    Metabolic Disorder Labs: Lab Results  Component Value Date   HGBA1C 5.5 04/10/2022   MPG 111.15 04/10/2022   No results found for: "PROLACTIN" Lab Results  Component Value Date   CHOL 167 11/20/2021   TRIG 107 11/20/2021   HDL 26 (L) 11/20/2021   CHOLHDL 6.4 11/20/2021   VLDL 21 11/20/2021   LDLCALC 120 (H) 11/20/2021    Physical Findings: AIMS: Facial and Oral Movements Muscles of Facial Expression: None, normal Lips and Perioral Area: None, normal Jaw: None, normal Tongue: None, normal,Extremity Movements Upper (arms, wrists, hands, fingers): None, normal Lower (legs, knees, ankles, toes): None, normal, Trunk Movements Neck, shoulders, hips: None, normal, Overall Severity Severity of abnormal movements (highest score from questions above): None, normal Incapacitation due to abnormal movements: None, normal Patient's awareness of abnormal movements (rate only patient's report): No Awareness, Dental Status Current problems with teeth and/or dentures?: No Does patient usually wear dentures?: No  CIWA:    COWS:     Musculoskeletal: Strength & Muscle Tone: within normal limits Gait & Station: normal Patient leans:  N/A  Psychiatric Specialty Exam:  Presentation  General Appearance:  Appropriate for Environment; Casual; Neat; Well Groomed  Eye Contact: Fair  Speech: Slow  Speech Volume: Decreased  Handedness: Left   Mood and Affect  Mood: Anxious  Affect: Congruent   Thought Process  Thought Processes: Goal Directed  Descriptions of Associations:Circumstantial  Orientation:Partial  Thought Content:Scattered  History of Schizophrenia/Schizoaffective disorder:Yes  Duration of Psychotic Symptoms:Greater than six months  Hallucinations:No data recorded Ideas of Reference:None  Suicidal Thoughts:No data recorded Homicidal Thoughts:No data recorded  Sensorium  Memory: Remote Good  Judgment: Fair  Insight: Fair   Materials engineer: Fair  Attention Span: Fair  Recall: [place] of Knowledge: Fair  Language: Fair   Psychomotor Activity  Psychomotor Activity:No data recorded  Assets  Assets:No data recorded  Sleep  Sleep:No data recorded   Physical Exam: Physical Exam Vitals reviewed.  Constitutional:      Appearance: Normal appearance.  HENT:     Head: Normocephalic and atraumatic.     Mouth/Throat:     Pharynx: Oropharynx is clear.  Eyes:     Pupils: Pupils are equal, round, and reactive to light.  Cardiovascular:     Rate and Rhythm: Normal rate and regular rhythm.  Pulmonary:     Effort: Pulmonary effort is normal.     Breath sounds: Normal breath sounds.  Abdominal:     General: Abdomen is flat.     Palpations: Abdomen is soft.  Musculoskeletal:        General: Normal range of motion.  Skin:    General: Skin is warm and dry.  Neurological:     General: No focal deficit present.     Mental Status: He is alert. Mental status is at baseline.  Psychiatric:        Mood and Affect: Mood normal.        Thought Content: Thought content normal.    Review of Systems  Unable to perform ROS: Language   Blood  pressure (!) 112/91, pulse 91, temperature 97.7 F (36.5 C), temperature source Oral, resp. rate 18, height 5\' 8"  (1.727 m), weight 85.8 kg, SpO2 98 %. Body mass index is 28.76 kg/m.   Treatment Plan Summary: Plan no change to treatment plan.  Supportive counseling daily.  Continue to work on placement options  Alethia Berthold, MD 08/31/2022, 10:48 AM

## 2022-08-31 NOTE — Plan of Care (Signed)
  Problem: Self-Concept: Goal: Ability to identify factors that promote anxiety will improve Outcome: Not Progressing   Problem: Self-Concept: Goal: Level of anxiety will decrease 08/31/2022 2234 by Aleen Sells, RN Outcome: Not Progressing 08/31/2022 2233 by Aleen Sells, RN Outcome: Progressing

## 2022-08-31 NOTE — Progress Notes (Signed)
Noon meds not administered due to patient sleeping in and not receiving 8 am meds until 10:49 am. MD aware. Informed patient he will receive the 5 pm med administration.

## 2022-08-31 NOTE — Progress Notes (Signed)
Patient in his bed sleeping during 8 am med time. Patient reminded or med administration 3 times but has not come to take meds yet. MD notified.

## 2022-08-31 NOTE — Progress Notes (Signed)
Recreation Therapy Notes   Date: 08/31/2022  Time: 10:30 am   Location: Craft room    Behavioral response: N/A   Intervention Topic: Time Management   Discussion/Intervention: Patient refused to attend group.   Clinical Observations/Feedback:  Patient refused to attend group.   Adam [name] LRT/CTRS         Adam [name] 08/31/2022 12:13 PM

## 2022-09-01 IMAGING — CT CT HEAD W/O CM
4 series · 16 of 47 positions shown, 18 images · non-contrast
Comparison: 12/07/2021

CLINICAL DATA: Schizophrenia, traumatic brain injury, altered level
of consciousness



[Series 2: head wo · axial · 0.41mm/px · z∈[+367,+482]mm · 7 of 31 slices shown, 9 images]
[im 4/31  brain]
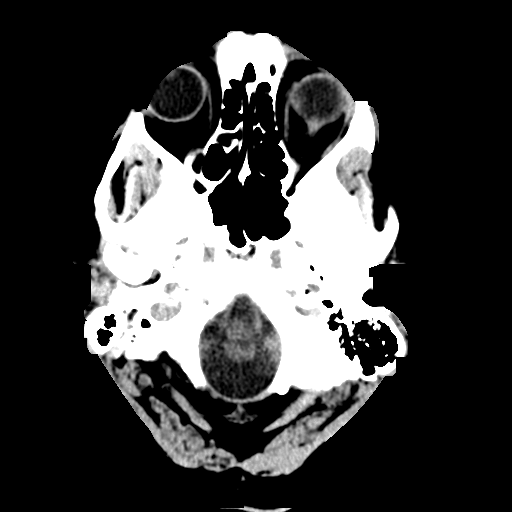
[im 4/31  bone]
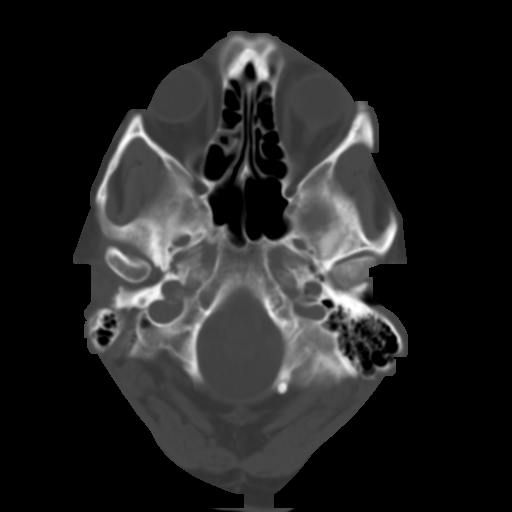
[im 8/31  brain]
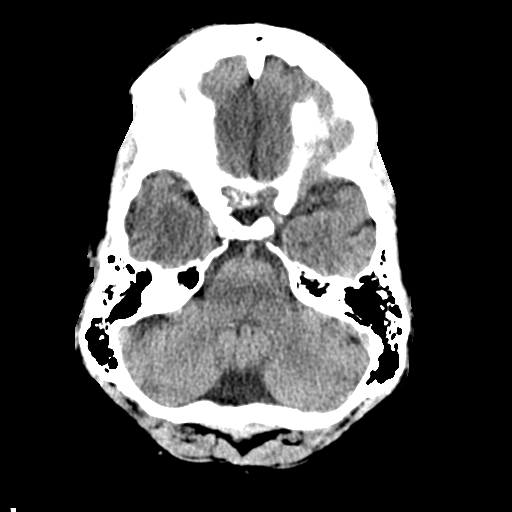
[im 12/31  brain]
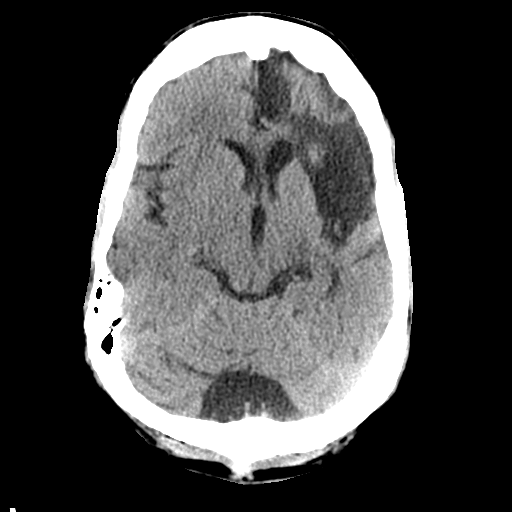
[im 16/31  brain]
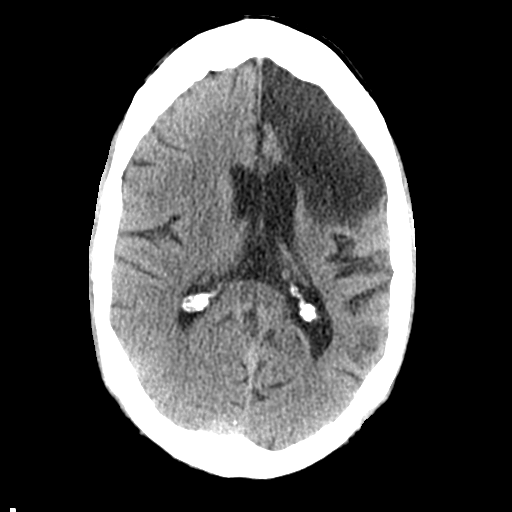
[im 19/31  brain]
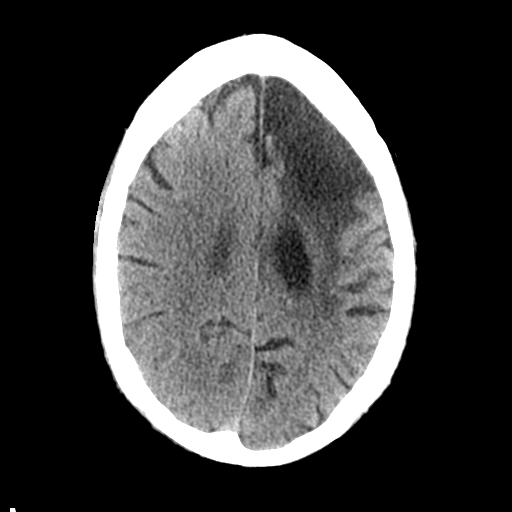
[im 19/31  bone]
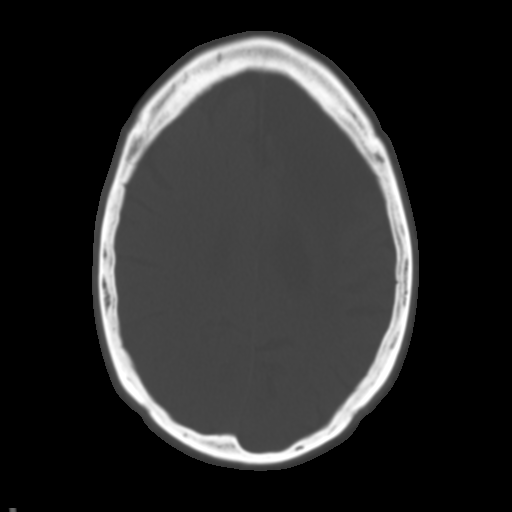
[im 23/31  brain]
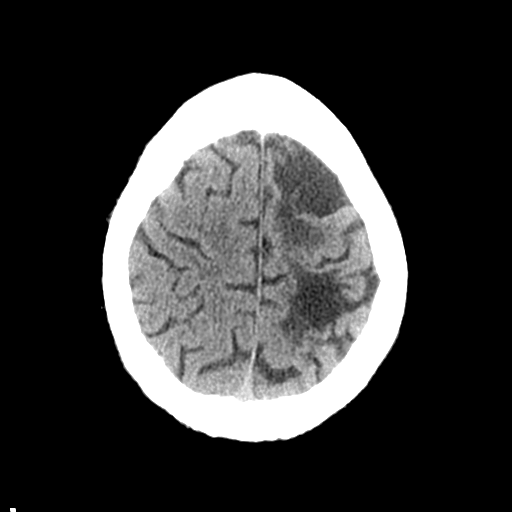
[im 27/31  brain]
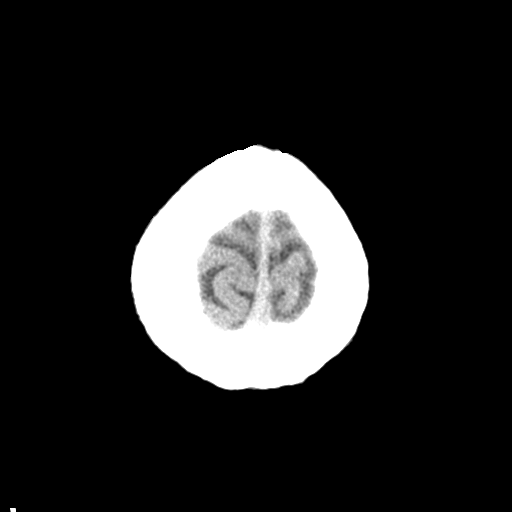

[Series 3: head bone · axial · 0.41mm/px · z∈[+366,+396]mm · 3 of 77 slices shown]
[im 8/77  bone]
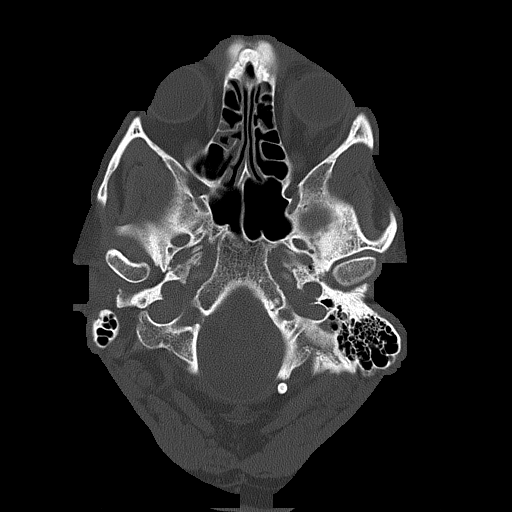
[im 16/77  bone]
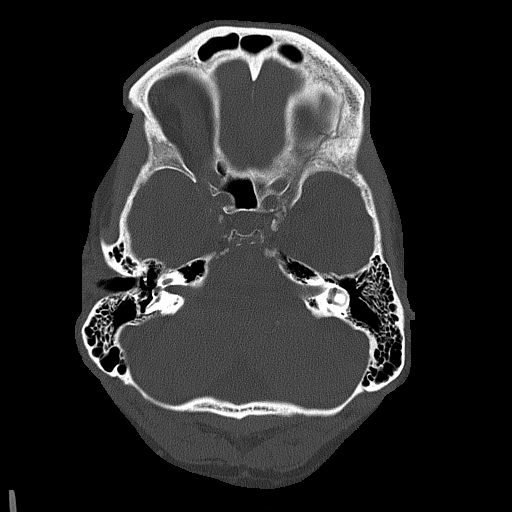
[im 23/77  bone]
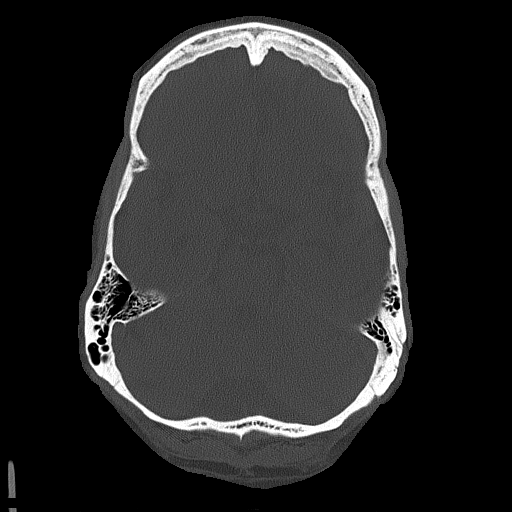

[Series 4: coronal soft tissue · coronal · 0.32mm/px · 3 of 68 slices shown]
[im 23/68  brain]
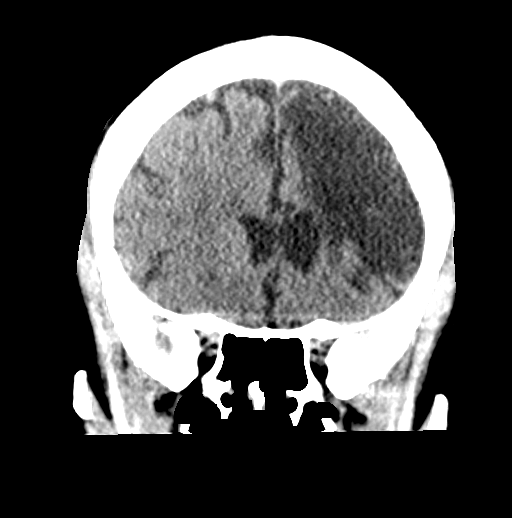
[im 30/68  brain]
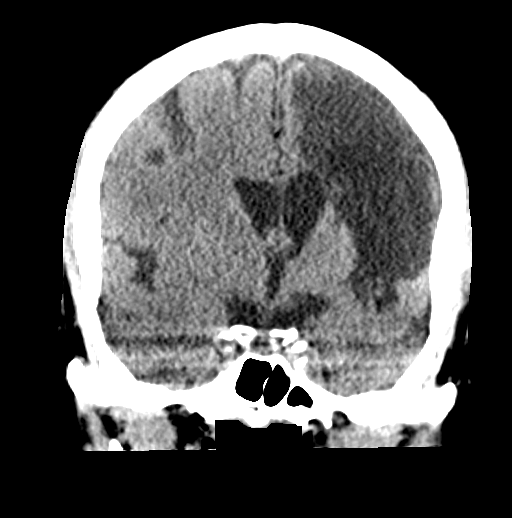
[im 38/68  brain]
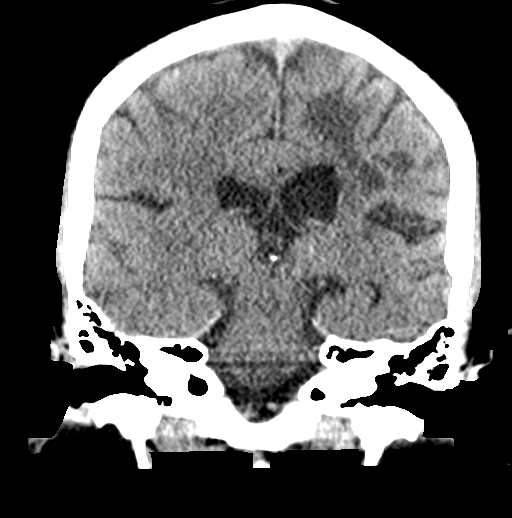

[Series 5: sagittal soft tissue · sagittal · 0.32mm/px · 3 of 52 slices shown]
[im 18/52  brain]
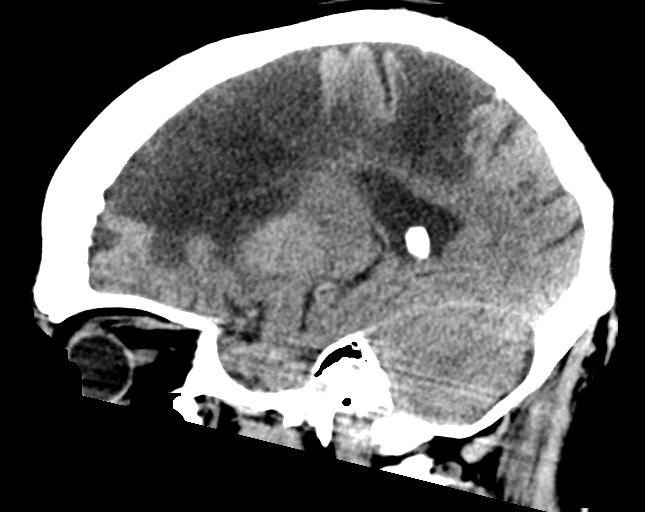
[im 26/52  brain]
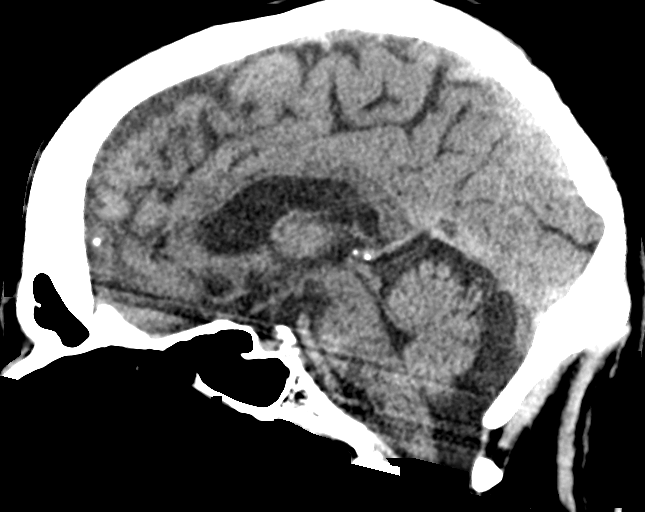
[im 35/52  brain]
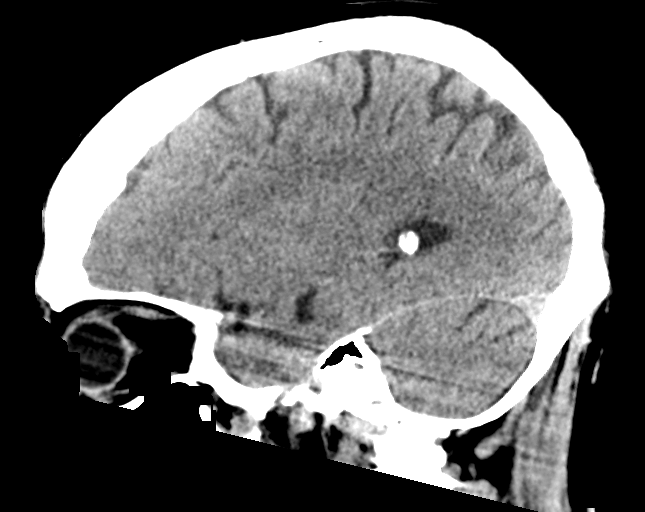

[16 of 47 positions shown; findings below may reference images not displayed]

FINDINGS: Brain: Chronic areas of encephalomalacia throughout the left
cerebral hemisphere unchanged. No acute infarct or hemorrhage.
Lateral ventricles and midline structures are unremarkable. No acute
extra-axial fluid collections. No mass effect.

Vascular: No hyperdense vessel or unexpected calcification.

Skull: Normal. Negative for fracture or focal lesion.

Sinuses/Orbits: No acute finding.

Other: None.
IMPRESSION: 1. Stable head CT, no acute intracranial process.

## 2022-09-01 NOTE — Plan of Care (Signed)
Pt is generally calm and cooperative today.  Pt denies SI, HI, AVH or pain.  PT is compliant with medications.  Pt reports good sleep with the help of medication.  Good appetite, low energy level and reported good concentration.  Only occasional outbursts due to frustration or not feeling his communication is understood.  Safety maintained through observation.

## 2022-09-01 NOTE — Progress Notes (Signed)
Patient attended outdoor recreation time and engaged in conversation discussing how sunlight and fresh air can benefit mental health.  

## 2022-09-01 NOTE — Progress Notes (Signed)
[facility] MD Progress Note  09/01/2022 5:50 PM Adam [name]  MRN:  [id number] Subjective: No changes with Adam [name] today.  His mood and affect are stable and unchanged.  He has no complaints. Principal Problem: Schizophrenia, chronic condition with acute exacerbation (HCC) Diagnosis: Principal Problem:   Schizophrenia, chronic condition with acute exacerbation (HCC)  Total Time spent with patient: 15 minutes  Past Psychiatric History: History of CVA and schizophrenia.  Past Medical History:  Past Medical History:  Diagnosis Date   Myocardial infarction (HCC)    Schizophrenia (HCC)    Stroke (HCC)    TBI (traumatic brain injury) (HCC)    History reviewed. No pertinent surgical history. Family History: History reviewed. No pertinent family history.  Social History:  Social History   Substance and Sexual Activity  Alcohol Use Not Currently     Social History   Substance and Sexual Activity  Drug Use Not Currently    Social History   Socioeconomic History   Marital status: Single    Spouse name: Not on file   Number of children: Not on file   Years of education: Not on file   Highest education level: Not on file  Occupational History   Not on file  Tobacco Use   Smoking status: Never    Passive exposure: Never   Smokeless tobacco: Never  Vaping Use   Vaping Use: Unknown  Substance and Sexual Activity   Alcohol use: Not Currently   Drug use: Not Currently   Sexual activity: Not Currently  Other Topics Concern   Not on file  Social History Narrative   Not on file   Social Determinants of Health   Financial Resource Strain: Not on file  Food Insecurity: Not on file  Transportation Needs: Not on file  Physical Activity: Not on file  Stress: Not on file  Social Connections: Not on file   Additional Social History:  Specify valuables returned:  (none)                      Sleep: Good  Appetite:  Good  Current Medications: Current Facility-Administered  Medications  Medication Dose Route Frequency Provider Last Rate Last Admin   acetaminophen (TYLENOL) tablet 650 mg  650 mg Oral Q6H PRN [name], [name], MD   650 mg at 08/18/22 0028   alum & mag hydroxide-simeth (MAALOX/MYLANTA) 200-200-20 MG/5ML suspension 30 mL  30 mL Oral Q4H PRN [name], [name], MD   30 mL at 04/26/22 2202   aspirin EC tablet 81 mg  81 mg Oral Daily [name], [name], MD   81 mg at 09/01/22 5427   atorvastatin (LIPITOR) tablet 20 mg  20 mg Oral Daily [name], [name], MD   20 mg at 09/01/22 0919   atropine 1 % ophthalmic solution 1 drop  1 drop Sublingual QID [name], MD   1 drop at 09/01/22 1658   benztropine (COGENTIN) tablet 0.5 mg  0.5 mg Oral BID [name], [name], MD   0.5 mg at 09/01/22 1657   clonazePAM (KLONOPIN) tablet 1 mg  1 mg Oral TID PRN He, [name], MD   1 mg at 08/24/22 2035   cloZAPine (CLOZARIL) tablet 300 mg  300 mg Oral QHS [name], [name], MD   300 mg at 08/31/22 2107   diphenhydrAMINE (BENADRYL) capsule 50 mg  50 mg Oral Q6H PRN [name], [name], MD   50 mg at 08/21/22 0012   Or   diphenhydrAMINE (BENADRYL)  injection 50 mg  50 mg Intramuscular Q6H PRN [name], [name], MD       feeding supplement (ENSURE ENLIVE / ENSURE PLUS) liquid 237 mL  237 mL Oral TID BM [name], [name], MD   237 mL at 09/01/22 1300   gabapentin (NEURONTIN) capsule 300 mg  300 mg Oral TID [name], [name], MD   300 mg at 09/01/22 1657   haloperidol (HALDOL) tablet 2 mg  2 mg Oral TID [name], [name], MD   2 mg at 09/01/22 1657   haloperidol (HALDOL) tablet 5 mg  5 mg Oral Q6H PRN [name], [name], MD   5 mg at 08/21/22 0013   Or   haloperidol lactate (HALDOL) injection 5 mg  5 mg Intramuscular Q6H PRN [name], [name], MD       lisinopril (ZESTRIL) tablet 5 mg  5 mg Oral Daily [name], [name], MD   5 mg at 09/01/22 0919   magnesium hydroxide (MILK OF MAGNESIA) suspension 30 mL  30 mL Oral Daily PRN [name], [name], MD   30 mL at 05/02/22 0627   metoprolol succinate (TOPROL-XL) 24 hr tablet  25 mg  25 mg Oral Daily [name], [name], MD   25 mg at 09/01/22 0917   ondansetron (ZOFRAN) tablet 4 mg  4 mg Oral Q8H PRN Jearld Lesch, NP   4 mg at 08/18/22 0052   paliperidone (INVEGA SUSTENNA) injection 156 mg  156 mg Intramuscular Q28 days [name], [name], MD   156 mg at 08/16/22 0833   temazepam (RESTORIL) capsule 15 mg  15 mg Oral QHS [name], [name], MD   15 mg at 08/31/22 2107   ziprasidone (GEODON) injection 20 mg  20 mg Intramuscular Q12H PRN [name], [name], MD   20 mg at 07/08/22 1453    Lab Results: No results found for this or any previous visit (from the past 48 hour(s)).  Blood Alcohol level:  Lab Results  Component Value Date   ETH <10 07/20/2021    Metabolic Disorder Labs: Lab Results  Component Value Date   HGBA1C 5.5 04/10/2022   MPG 111.15 04/10/2022   No results found for: "PROLACTIN" Lab Results  Component Value Date   CHOL 167 11/20/2021   TRIG 107 11/20/2021   HDL 26 (L) 11/20/2021   CHOLHDL 6.4 11/20/2021   VLDL 21 11/20/2021   LDLCALC 120 (H) 11/20/2021    Physical Findings: AIMS: Facial and Oral Movements Muscles of Facial Expression: None, normal Lips and Perioral Area: None, normal Jaw: None, normal Tongue: None, normal,Extremity Movements Upper (arms, wrists, hands, fingers): None, normal Lower (legs, knees, ankles, toes): None, normal, Trunk Movements Neck, shoulders, hips: None, normal, Overall Severity Severity of abnormal movements (highest score from questions above): None, normal Incapacitation due to abnormal movements: None, normal Patient's awareness of abnormal movements (rate only patient's report): No Awareness, Dental Status Current problems with teeth and/or dentures?: No Does patient usually wear dentures?: No  CIWA:    COWS:     Musculoskeletal: Strength & Muscle Tone: within normal limits Gait & Station: normal Patient leans: N/A  Psychiatric Specialty Exam:  Presentation  General Appearance:  Appropriate  for Environment; Casual; Neat; Well Groomed  Eye Contact: Fair  Speech: Slow  Speech Volume: Decreased  Handedness: Left   Mood and Affect  Mood: Anxious  Affect: Congruent   Thought Process  Thought Processes: Goal Directed  Descriptions of Associations:Circumstantial  Orientation:Partial  Thought Content:Scattered  History  of Schizophrenia/Schizoaffective disorder:Yes  Duration of Psychotic Symptoms:Greater than six months  Hallucinations:No data recorded Ideas of Reference:None  Suicidal Thoughts:No data recorded Homicidal Thoughts:No data recorded  Sensorium  Memory: Remote Good  Judgment: Fair  Insight: Fair   Materials engineer: Fair  Attention Span: Fair  Recall: [place] of Knowledge: Fair  Language: Fair   Psychomotor Activity  Psychomotor Activity:No data recorded  Assets  Assets:No data recorded  Sleep  Sleep:No data recorded    Blood pressure 117/82, pulse 92, temperature 98 F (36.7 C), temperature source Oral, resp. rate 18, height 5\' 8"  (1.727 m), weight 85.8 kg, SpO2 97 %. Body mass index is 28.76 kg/m.   Treatment Plan Summary: Daily contact with patient to assess and evaluate symptoms and progress in treatment, Medication management, and Plan continue current medications.  [name], DO 09/01/2022, 5:50 PM

## 2022-09-02 NOTE — Progress Notes (Signed)
Patient is alert x 3 with periods of confusion to time, he appears irritable and when asked what is going o with him he couldn't express himself. .patient was offered emotional support and encouragement . Patient denies SI/HI/AVH, 15 minutes safety checks maintained will continue to monitor closely

## 2022-09-02 NOTE — Progress Notes (Signed)
[facility] MD Progress Note  09/02/2022 3:56 PM [name]  MRN:  [id number] Subjective: No changes with Adam [name].  No issues.  Good controls.  No side effects.  Principal Problem: Schizophrenia, chronic condition with acute exacerbation (HCC) Diagnosis: Principal Problem:   Schizophrenia, chronic condition with acute exacerbation (HCC)  Total Time spent with patient: 15 minutes  Past Psychiatric History: CVA and schizophrenia  Past Medical History:  Past Medical History:  Diagnosis Date   Myocardial infarction (HCC)    Schizophrenia (HCC)    Stroke (HCC)    TBI (traumatic brain injury) (HCC)    History reviewed. No pertinent surgical history. Family History: History reviewed. No pertinent family history.  Social History:  Social History   Substance and Sexual Activity  Alcohol Use Not Currently     Social History   Substance and Sexual Activity  Drug Use Not Currently    Social History   Socioeconomic History   Marital status: Single    Spouse name: Not on file   Number of children: Not on file   Years of education: Not on file   Highest education level: Not on file  Occupational History   Not on file  Tobacco Use   Smoking status: Never    Passive exposure: Never   Smokeless tobacco: Never  Vaping Use   Vaping Use: Unknown  Substance and Sexual Activity   Alcohol use: Not Currently   Drug use: Not Currently   Sexual activity: Not Currently  Other Topics Concern   Not on file  Social History Narrative   Not on file   Social Determinants of Health   Financial Resource Strain: Not on file  Food Insecurity: Not on file  Transportation Needs: Not on file  Physical Activity: Not on file  Stress: Not on file  Social Connections: Not on file   Additional Social History:  Specify valuables returned:  (none)                      Sleep: Good  Appetite:  Good  Current Medications: Current Facility-Administered Medications  Medication Dose Route  Frequency Provider Last Rate Last Admin   acetaminophen (TYLENOL) tablet 650 mg  650 mg Oral Q6H PRN [name], [name], MD   650 mg at 08/18/22 0028   alum & mag hydroxide-simeth (MAALOX/MYLANTA) 200-200-20 MG/5ML suspension 30 mL  30 mL Oral Q4H PRN [name], [name], MD   30 mL at 04/26/22 5277   aspirin EC tablet 81 mg  81 mg Oral Daily [name], [name], MD   81 mg at 09/02/22 0800   atorvastatin (LIPITOR) tablet 20 mg  20 mg Oral Daily [name], [name], MD   20 mg at 09/02/22 0801   atropine 1 % ophthalmic solution 1 drop  1 drop Sublingual QID [name], MD   1 drop at 09/02/22 1201   benztropine (COGENTIN) tablet 0.5 mg  0.5 mg Oral BID [name], [name], MD   0.5 mg at 09/02/22 0801   clonazePAM (KLONOPIN) tablet 1 mg  1 mg Oral TID PRN He, [name], MD   1 mg at 08/24/22 2035   cloZAPine (CLOZARIL) tablet 300 mg  300 mg Oral QHS [name], [name], MD   300 mg at 09/01/22 2111   diphenhydrAMINE (BENADRYL) capsule 50 mg  50 mg Oral Q6H PRN [name], [name], MD   50 mg at 09/01/22 2111   Or   diphenhydrAMINE (BENADRYL) injection 50 mg  50 mg  Intramuscular Q6H PRN [name], [name], MD       feeding supplement (ENSURE ENLIVE / ENSURE PLUS) liquid 237 mL  237 mL Oral TID BM [name], [name], MD   237 mL at 09/02/22 1358   gabapentin (NEURONTIN) capsule 300 mg  300 mg Oral TID [name], [name], MD   300 mg at 09/02/22 1201   haloperidol (HALDOL) tablet 2 mg  2 mg Oral TID [name], [name], MD   2 mg at 09/02/22 1201   haloperidol (HALDOL) tablet 5 mg  5 mg Oral Q6H PRN [name], [name], MD   5 mg at 08/21/22 0013   Or   haloperidol lactate (HALDOL) injection 5 mg  5 mg Intramuscular Q6H PRN [name], [name], MD       lisinopril (ZESTRIL) tablet 5 mg  5 mg Oral Daily [name], [name], MD   5 mg at 09/02/22 0800   magnesium hydroxide (MILK OF MAGNESIA) suspension 30 mL  30 mL Oral Daily PRN [name], [name], MD   30 mL at 05/02/22 0627   metoprolol succinate (TOPROL-XL) 24 hr tablet 25 mg  25 mg Oral Daily [name],  [name], MD   25 mg at 09/02/22 0801   ondansetron (ZOFRAN) tablet 4 mg  4 mg Oral Q8H PRN Jearld Lesch, NP   4 mg at 08/18/22 0052   paliperidone (INVEGA SUSTENNA) injection 156 mg  156 mg Intramuscular Q28 days [name], [name], MD   156 mg at 08/16/22 0833   temazepam (RESTORIL) capsule 15 mg  15 mg Oral QHS [name], [name], MD   15 mg at 09/01/22 2111   ziprasidone (GEODON) injection 20 mg  20 mg Intramuscular Q12H PRN [name], [name], MD   20 mg at 07/08/22 1453    Lab Results: No results found for this or any previous visit (from the past 48 hour(s)).  Blood Alcohol level:  Lab Results  Component Value Date   ETH <10 07/20/2021    Metabolic Disorder Labs: Lab Results  Component Value Date   HGBA1C 5.5 04/10/2022   MPG 111.15 04/10/2022   No results found for: "PROLACTIN" Lab Results  Component Value Date   CHOL 167 11/20/2021   TRIG 107 11/20/2021   HDL 26 (L) 11/20/2021   CHOLHDL 6.4 11/20/2021   VLDL 21 11/20/2021   LDLCALC 120 (H) 11/20/2021    Physical Findings: AIMS: Facial and Oral Movements Muscles of Facial Expression: None, normal Lips and Perioral Area: None, normal Jaw: None, normal Tongue: None, normal,Extremity Movements Upper (arms, wrists, hands, fingers): None, normal Lower (legs, knees, ankles, toes): None, normal, Trunk Movements Neck, shoulders, hips: None, normal, Overall Severity Severity of abnormal movements (highest score from questions above): None, normal Incapacitation due to abnormal movements: None, normal Patient's awareness of abnormal movements (rate only patient's report): No Awareness, Dental Status Current problems with teeth and/or dentures?: No Does patient usually wear dentures?: No  CIWA:    COWS:     Musculoskeletal: Strength & Muscle Tone: within normal limits Gait & Station: normal Patient leans: N/A  Psychiatric Specialty Exam:  Presentation  General Appearance:  Appropriate for Environment; Casual; Neat;  Well Groomed  Eye Contact: Fair  Speech: Slow  Speech Volume: Decreased  Handedness: Left   Mood and Affect  Mood: Anxious  Affect: Congruent   Thought Process  Thought Processes: Goal Directed  Descriptions of Associations:Circumstantial  Orientation:Partial  Thought Content:Scattered  History of Schizophrenia/Schizoaffective disorder:Yes  Duration of  Psychotic Symptoms:Greater than six months  Hallucinations:No data recorded Ideas of Reference:None  Suicidal Thoughts:No data recorded Homicidal Thoughts:No data recorded  Sensorium  Memory: Remote Good  Judgment: Fair  Insight: Fair   Materials engineer: Fair  Attention Span: Fair  Recall: [place] of Knowledge: Fair  Language: Fair   Psychomotor Activity  Psychomotor Activity:No data recorded  Assets  Assets:No data recorded  Sleep  Sleep:No data recorded    Blood pressure 106/81, pulse (!) 107, temperature 97.8 F (36.6 C), temperature source Oral, resp. rate 18, height 5\' 8"  (1.727 m), weight 85.8 kg, SpO2 98 %. Body mass index is 28.76 kg/m.   Treatment Plan Summary: Daily contact with patient to assess and evaluate symptoms and progress in treatment, Medication management, and Plan continue current medications.  [name], DO 09/02/2022, 3:56 PM

## 2022-09-02 NOTE — Progress Notes (Signed)
Patient came to the nurses station with shaving cream in his hand, looking at this Probation officer. This Probation officer knows from previous encounters, that patient wants to shave. This Probation officer went with patient and assisted him with shaving his face. Patient tolerated shaving well, without any issues. Patient remains safe on the unit and staff will continue to monitor.

## 2022-09-02 NOTE — BHH Group Notes (Signed)
LCSW Wellness Group Note   09/02/2022 1:00pm  Type of Group and Topic: Psychoeducational Group:  Wellness  Participation Level:  none  Description of Group  Wellness group introduces the topic and its focus on developing healthy habits across the spectrum and its relationship to a decrease in hospital admissions.  Six areas of wellness are discussed: physical, social spiritual, intellectual, occupational, and emotional.  Patients are asked to consider their current wellness habits and to identify areas of wellness where they are interested and able to focus on improvements.    Therapeutic Goals Patients will understand components of wellness and how they can positively impact overall health.  Patients will identify areas of wellness where they have developed good habits. Patients will identify areas of wellness where they would like to make improvements.    Summary of Patient Progress: pt came to first half of group, somewhat disruptive, but redirectable.  Making loud noises sporadically.  Got up and left halfway through without incident.      Therapeutic Modalities: Cognitive Behavioral Therapy Psychoeducation    Joanne Chars, LCSW

## 2022-09-02 NOTE — Plan of Care (Signed)
D- Patient alert and oriented to person, place, and situation. 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 Patient can answer questions with simple responses, and sometimes repeats words that he hears others say. Patient presented in a pleasant mood on assessment stating that he slept "good" last night and had no complaints to voice to this Probation officer. Patient brought his clothes up to the nurses station and wanted them washed. Patient also pointed to his bed linen, wanting it changed. Patient also denied SI, HI, AVH, and pain upon assessment. Patient had no stated goals for today.  A- Scheduled medications administered to patient, per MD orders. Support and encouragement provided.  Routine safety checks conducted every 15 minutes.  Patient informed to notify staff with problems or concerns.  R- No adverse drug reactions noted. Patient contracts for safety at this time. Patient compliant with medications and treatment plan. Patient receptive, calm, and cooperative. Patient interacts well with others on the unit. Patient remains safe at this time.  Problem: Education: Goal: Ability to state activities that reduce stress will improve Outcome: Progressing   Problem: Coping: Goal: Ability to identify and develop effective coping behavior will improve Outcome: Progressing   Problem: Self-Concept: Goal: Ability to identify factors that promote anxiety will improve Outcome: Progressing Goal: Level of anxiety will decrease Outcome: Progressing Goal: Ability to modify response to factors that promote anxiety will improve Outcome: Progressing   Problem: Education: Goal: Knowledge of General Education information will improve Description: Including pain rating scale, medication(s)/side effects and non-pharmacologic comfort measures Outcome: Progressing   Problem: Health Behavior/Discharge Planning: Goal: Ability to manage health-related needs will improve Outcome:  Progressing   Problem: Clinical Measurements: Goal: Ability to maintain clinical measurements within normal limits will improve Outcome: Progressing Goal: Will remain free from infection Outcome: Progressing Goal: Diagnostic test results will improve Outcome: Progressing Goal: Respiratory complications will improve Outcome: Progressing Goal: Cardiovascular complication will be avoided Outcome: Progressing   Problem: Activity: Goal: Risk for activity intolerance will decrease Outcome: Progressing   Problem: Nutrition: Goal: Adequate nutrition will be maintained Outcome: Progressing   Problem: Coping: Goal: Level of anxiety will decrease Outcome: Progressing   Problem: Elimination: Goal: Will not experience complications related to bowel motility Outcome: Progressing Goal: Will not experience complications related to urinary retention Outcome: Progressing   Problem: Pain Managment: Goal: General experience of comfort will improve Outcome: Progressing   Problem: Safety: Goal: Ability to remain free from injury will improve Outcome: Progressing   Problem: Skin Integrity: Goal: Risk for impaired skin integrity will decrease Outcome: Progressing   Problem: Activity: Goal: Interest or engagement in leisure activities will improve Outcome: Progressing   Problem: Coping: Goal: Coping ability will improve Outcome: Progressing   Problem: Education: Goal: Will be free of psychotic symptoms Outcome: Progressing

## 2022-09-03 NOTE — Plan of Care (Signed)
D- Patient alert and oriented to person, place, and situation. Patient slept in most of the morning and got up at lunch time. Patient presents in a pleasant mood on assessment, reporting on his self-inventory that he slept "good" last night and had no complaints to voice to this Probation officer. 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 Patient answers questions with a "yes" or "no" response. Patient denied SI, HI, AVH, and pain at this time. Patient also denies any signs/symptoms of depression/anxiety on his self-inventory. Patient had no stated goals for today.  A- Scheduled medications administered to patient, per MD orders. Support and encouragement provided.  Routine safety checks conducted every 15 minutes.  Patient informed to notify staff with problems or concerns.  R- No adverse drug reactions noted. Patient contracts for safety at this time. Patient compliant with medications and treatment plan. Patient receptive, calm, and cooperative. Patient interacts well with others on the unit. Patient remains safe at this time.  Problem: Education: Goal: Ability to state activities that reduce stress will improve Outcome: Progressing   Problem: Coping: Goal: Ability to identify and develop effective coping behavior will improve Outcome: Progressing   Problem: Self-Concept: Goal: Ability to identify factors that promote anxiety will improve Outcome: Progressing Goal: Level of anxiety will decrease Outcome: Progressing Goal: Ability to modify response to factors that promote anxiety will improve Outcome: Progressing   Problem: Education: Goal: Knowledge of General Education information will improve Description: Including pain rating scale, medication(s)/side effects and non-pharmacologic comfort measures Outcome: Progressing   Problem: Health Behavior/Discharge Planning: Goal: Ability to manage health-related needs will improve Outcome: Progressing   Problem:  Clinical Measurements: Goal: Ability to maintain clinical measurements within normal limits will improve Outcome: Progressing Goal: Will remain free from infection Outcome: Progressing Goal: Diagnostic test results will improve Outcome: Progressing Goal: Respiratory complications will improve Outcome: Progressing Goal: Cardiovascular complication will be avoided Outcome: Progressing   Problem: Activity: Goal: Risk for activity intolerance will decrease Outcome: Progressing   Problem: Nutrition: Goal: Adequate nutrition will be maintained Outcome: Progressing   Problem: Coping: Goal: Level of anxiety will decrease Outcome: Progressing   Problem: Elimination: Goal: Will not experience complications related to bowel motility Outcome: Progressing Goal: Will not experience complications related to urinary retention Outcome: Progressing   Problem: Pain Managment: Goal: General experience of comfort will improve Outcome: Progressing   Problem: Safety: Goal: Ability to remain free from injury will improve Outcome: Progressing   Problem: Skin Integrity: Goal: Risk for impaired skin integrity will decrease Outcome: Progressing   Problem: Activity: Goal: Interest or engagement in leisure activities will improve Outcome: Progressing   Problem: Coping: Goal: Coping ability will improve Outcome: Progressing   Problem: Education: Goal: Will be free of psychotic symptoms Outcome: Progressing

## 2022-09-03 NOTE — Progress Notes (Signed)
Patient is still asleep, so this writer will administer scheduled morning medication when he wakes up. MD was made aware during progression rounds.

## 2022-09-03 NOTE — Progress Notes (Signed)
Patient is alert x 3, no distress noted he appears less anxious he was seen interacting appropriately with peers and staff. Patient was offered emotional support and encouragement . Patient denies SI/HI/AVH, 15 minutes safety checks maintained will continue to monitor closely.

## 2022-09-03 NOTE — Progress Notes (Signed)
This Probation officer was notified by Moshe Salisbury, MHT that patient did not eat much of his meals today. He only asked for a soda at dinnertime.

## 2022-09-03 NOTE — Progress Notes (Signed)
Recreation Therapy Notes  Date: 09/03/2022  Time: 10:30 am   Location: Craft room    Behavioral response: N/A   Intervention Topic: Creative Expressions    Discussion/Intervention: Patient refused to attend group.   Clinical Observations/Feedback:  Patient refused to attend group.   Malina Geers LRT/CTRS        Emillie Chasen 09/03/2022 12:42 PM

## 2022-09-03 NOTE — Progress Notes (Signed)
[facility] MD Progress Note  09/03/2022 6:16 PM Adam [name]  MRN:  [id number] Subjective: Follow-up 61 year old man with schizophrenia.  No change in presentation or behavior Principal Problem: Schizophrenia, chronic condition with acute exacerbation (HCC) Diagnosis: Principal Problem:   Schizophrenia, chronic condition with acute exacerbation (HCC)  Total Time spent with patient: 20 minutes  Past Psychiatric History: History of schizophrenia  Past Medical History:  Past Medical History:  Diagnosis Date   Myocardial infarction (HCC)    Schizophrenia (HCC)    Stroke (HCC)    TBI (traumatic brain injury) (HCC)    History reviewed. No pertinent surgical history. Family History: History reviewed. No pertinent family history. Family Psychiatric  History: See previous Social History:  Social History   Substance and Sexual Activity  Alcohol Use Not Currently     Social History   Substance and Sexual Activity  Drug Use Not Currently    Social History   Socioeconomic History   Marital status: Single    Spouse name: Not on file   Number of children: Not on file   Years of education: Not on file   Highest education level: Not on file  Occupational History   Not on file  Tobacco Use   Smoking status: Never    Passive exposure: Never   Smokeless tobacco: Never  Vaping Use   Vaping Use: Unknown  Substance and Sexual Activity   Alcohol use: Not Currently   Drug use: Not Currently   Sexual activity: Not Currently  Other Topics Concern   Not on file  Social History Narrative   Not on file   Social Determinants of Health   Financial Resource Strain: Not on file  Food Insecurity: Not on file  Transportation Needs: Not on file  Physical Activity: Not on file  Stress: Not on file  Social Connections: Not on file   Additional Social History:  Specify valuables returned:  (none)                      Sleep: Fair  Appetite:  Fair  Current Medications: Current  Facility-Administered Medications  Medication Dose Route Frequency Provider Last Rate Last Admin   acetaminophen (TYLENOL) tablet 650 mg  650 mg Oral Q6H PRN Taisia Fantini T, MD   650 mg at 08/18/22 0028   alum & mag hydroxide-simeth (MAALOX/MYLANTA) 200-200-20 MG/5ML suspension 30 mL  30 mL Oral Q4H PRN Raynette Arras T, MD   30 mL at 04/26/22 8416   aspirin EC tablet 81 mg  81 mg Oral Daily Brent Noto T, MD   81 mg at 09/03/22 1152   atorvastatin (LIPITOR) tablet 20 mg  20 mg Oral Daily Youssef Footman T, MD   20 mg at 09/03/22 1152   atropine 1 % ophthalmic solution 1 drop  1 drop Sublingual QID [name], MD   1 drop at 09/03/22 1646   benztropine (COGENTIN) tablet 0.5 mg  0.5 mg Oral BID Malli Falotico T, MD   0.5 mg at 09/03/22 1646   clonazePAM (KLONOPIN) tablet 1 mg  1 mg Oral TID PRN He, [name], MD   1 mg at 08/24/22 2035   cloZAPine (CLOZARIL) tablet 300 mg  300 mg Oral QHS Oliana Gowens T, MD   300 mg at 09/02/22 2113   diphenhydrAMINE (BENADRYL) capsule 50 mg  50 mg Oral Q6H PRN Brady Plant, [name], MD   50 mg at 09/01/22 2111   Or   diphenhydrAMINE (BENADRYL) injection 50 mg  50 mg Intramuscular Q6H PRN Robertha Staples T, MD       feeding supplement (ENSURE ENLIVE / ENSURE PLUS) liquid 237 mL  237 mL Oral TID BM Melrose Kearse T, MD   237 mL at 09/03/22 1407   gabapentin (NEURONTIN) capsule 300 mg  300 mg Oral TID Special Ranes T, MD   300 mg at 09/03/22 1646   haloperidol (HALDOL) tablet 2 mg  2 mg Oral TID Aricela Bertagnolli, [name], MD   2 mg at 09/03/22 1646   haloperidol (HALDOL) tablet 5 mg  5 mg Oral Q6H PRN Adiyah Lame, [name], MD   5 mg at 08/21/22 0013   Or   haloperidol lactate (HALDOL) injection 5 mg  5 mg Intramuscular Q6H PRN Chyrel Taha T, MD       lisinopril (ZESTRIL) tablet 5 mg  5 mg Oral Daily Audric Venn T, MD   5 mg at 09/03/22 1152   magnesium hydroxide (MILK OF MAGNESIA) suspension 30 mL  30 mL Oral Daily PRN Anneth Brunell T, MD   30 mL at 05/02/22 0627   metoprolol succinate  (TOPROL-XL) 24 hr tablet 25 mg  25 mg Oral Daily Theus Espin T, MD   25 mg at 09/03/22 1153   ondansetron (ZOFRAN) tablet 4 mg  4 mg Oral Q8H PRN Deloria Lair, NP   4 mg at 08/18/22 0052   paliperidone (INVEGA SUSTENNA) injection 156 mg  156 mg Intramuscular Q28 days Chaniqua Brisby, [name], MD   156 mg at 08/16/22 0833   temazepam (RESTORIL) capsule 15 mg  15 mg Oral QHS Glennys Schorsch T, MD   15 mg at 09/02/22 2113   ziprasidone (GEODON) injection 20 mg  20 mg Intramuscular Q12H PRN Quorra Rosene, [name], MD   20 mg at 07/08/22 1453    Lab Results: No results found for this or any previous visit (from the past 48 hour(s)).  Blood Alcohol level:  Lab Results  Component Value Date   ETH <10 22/29/7989    Metabolic Disorder Labs: Lab Results  Component Value Date   HGBA1C 5.5 04/10/2022   MPG 111.15 04/10/2022   No results found for: "PROLACTIN" Lab Results  Component Value Date   CHOL 167 11/20/2021   TRIG 107 11/20/2021   HDL 26 (L) 11/20/2021   CHOLHDL 6.4 11/20/2021   VLDL 21 11/20/2021   LDLCALC 120 (H) 11/20/2021    Physical Findings: AIMS: Facial and Oral Movements Muscles of Facial Expression: None, normal Lips and Perioral Area: None, normal Jaw: None, normal Tongue: None, normal,Extremity Movements Upper (arms, wrists, hands, fingers): None, normal Lower (legs, knees, ankles, toes): None, normal, Trunk Movements Neck, shoulders, hips: None, normal, Overall Severity Severity of abnormal movements (highest score from questions above): None, normal Incapacitation due to abnormal movements: None, normal Patient's awareness of abnormal movements (rate only patient's report): No Awareness, Dental Status Current problems with teeth and/or dentures?: No Does patient usually wear dentures?: No  CIWA:    COWS:     Musculoskeletal: Strength & Muscle Tone: within normal limits Gait & Station: normal Patient leans: N/A  Psychiatric Specialty Exam:  Presentation  General  Appearance:  Appropriate for Environment; Casual; Neat; Well Groomed  Eye Contact: Fair  Speech: Slow  Speech Volume: Decreased  Handedness: Left   Mood and Affect  Mood: Anxious  Affect: Congruent   Thought Process  Thought Processes: Goal Directed  Descriptions of Associations:Circumstantial  Orientation:Partial  Thought Content:Scattered  History of Schizophrenia/Schizoaffective disorder:Yes  Duration of Psychotic Symptoms:Greater than six months  Hallucinations:No data recorded Ideas of Reference:None  Suicidal Thoughts:No data recorded Homicidal Thoughts:No data recorded  Sensorium  Memory: Remote Good  Judgment: Fair  Insight: Fair   Chartered certified accountant: Fair  Attention Span: Fair  Recall: [employer] of Knowledge: Fair  Language: Fair   Psychomotor Activity  Psychomotor Activity:No data recorded  Assets  Assets:No data recorded  Sleep  Sleep:No data recorded   Physical Exam: Physical Exam Vitals and nursing note reviewed.  Constitutional:      Appearance: Normal appearance.  HENT:     Head: Normocephalic and atraumatic.     Mouth/Throat:     Pharynx: Oropharynx is clear.  Eyes:     Pupils: Pupils are equal, round, and reactive to light.  Cardiovascular:     Rate and Rhythm: Normal rate and regular rhythm.  Pulmonary:     Effort: Pulmonary effort is normal.     Breath sounds: Normal breath sounds.  Abdominal:     General: Abdomen is flat.     Palpations: Abdomen is soft.  Musculoskeletal:        General: Normal range of motion.  Skin:    General: Skin is warm and dry.  Neurological:     General: No focal deficit present.     Mental Status: He is alert. Mental status is at baseline.  Psychiatric:        Mood and Affect: Mood normal.        Thought Content: Thought content normal.    Review of Systems  Unable to perform ROS: Language   Blood pressure (!) 126/96, pulse 94, temperature  98.2 F (36.8 C), temperature source Oral, resp. rate 18, height 5\' 8"  (1.727 m), weight 85.8 kg, SpO2 100 %. Body mass index is 28.76 kg/m.   Treatment Plan Summary: Plan completely stable.  No change to treatment.  Continue to await some kind of discharge  , MD 09/03/2022, 6:16 PM

## 2022-09-03 NOTE — Progress Notes (Signed)
Patient was given his scheduled morning medication. This Probation officer held his noon meds, being that he had just received his morning medications.

## 2022-09-03 NOTE — Progress Notes (Signed)
Patient calm and pleasant during assessment denying SI/HI/AVH. Pt observed interacting appropriately with staff and peers on the unit. Pt compliant with medication administration per MD orders. Pt given education, support, and encouragement to be active in his treatment plan. Pt being monitored Q 15 minutes for safety per unit protocol, remains safe on the unit  

## 2022-09-04 LAB — CBC WITH DIFFERENTIAL/PLATELET
Abs Immature Granulocytes: 0.01 10*3/uL (ref 0.00–0.07)
Basophils Absolute: 0.1 10*3/uL (ref 0.0–0.1)
Basophils Relative: 2 %
Eosinophils Absolute: 0.1 10*3/uL (ref 0.0–0.5)
Eosinophils Relative: 1 %
HCT: 38.5 % — ABNORMAL LOW (ref 39.0–52.0)
Hemoglobin: 13.3 g/dL (ref 13.0–17.0)
Immature Granulocytes: 0 %
Lymphocytes Relative: 22 %
Lymphs Abs: 1 10*3/uL (ref 0.7–4.0)
MCH: 31 pg (ref 26.0–34.0)
MCHC: 34.5 g/dL (ref 30.0–36.0)
MCV: 89.7 fL (ref 80.0–100.0)
Monocytes Absolute: 0.4 10*3/uL (ref 0.1–1.0)
Monocytes Relative: 8 %
Neutro Abs: 3.1 10*3/uL (ref 1.7–7.7)
Neutrophils Relative %: 67 %
Platelets: 162 10*3/uL (ref 150–400)
RBC: 4.29 MIL/uL (ref 4.22–5.81)
RDW: 14.1 % (ref 11.5–15.5)
WBC: 4.5 10*3/uL (ref 4.0–10.5)
nRBC: 0 % (ref 0.0–0.2)

## 2022-09-04 NOTE — Plan of Care (Signed)
  Problem: Education: Goal: Ability to state activities that reduce stress will improve Outcome: Progressing   Problem: Coping: Goal: Ability to identify and develop effective coping behavior will improve Outcome: Progressing   Problem: Self-Concept: Goal: Ability to identify factors that promote anxiety will improve Outcome: Progressing Goal: Level of anxiety will decrease Outcome: Progressing Goal: Ability to modify response to factors that promote anxiety will improve Outcome: Progressing   Problem: Education: Goal: Knowledge of General Education information will improve Description: Including pain rating scale, medication(s)/side effects and non-pharmacologic comfort measures Outcome: Progressing   Problem: Education: Goal: Will be free of psychotic symptoms Outcome: Progressing   Problem: Coping: Goal: Coping ability will improve Outcome: Progressing   Problem: Activity: Goal: Interest or engagement in leisure activities will improve Outcome: Progressing   Problem: Skin Integrity: Goal: Risk for impaired skin integrity will decrease Outcome: Progressing   

## 2022-09-04 NOTE — Group Note (Signed)
[facility] LCSW Group Therapy Note   Group Date: 09/04/2022 Start Time: 1300 End Time: 1400   Type of Therapy and Topic: Group Therapy: Avoiding Self-Sabotaging and Enabling Behaviors  Participation Level: Did Not Attend  Mood:  Description of Group:  In this group, patients will learn how to identify obstacles, self-sabotaging and enabling behaviors, as well as: what are they, why do we do them and what needs these behaviors meet. Discuss unhealthy relationships and how to have positive healthy boundaries with those that sabotage and enable. Explore aspects of self-sabotage and enabling in yourself and how to limit these self-destructive behaviors in everyday life.   Therapeutic Goals: 1. Patient will identify one obstacle that relates to self-sabotage and enabling behaviors 2. Patient will identify one personal self-sabotaging or enabling behavior they did prior to admission 3. Patient will state a plan to change the above identified behavior 4. Patient will demonstrate ability to communicate their needs through discussion and/or role play.    Summary of Patient Progress: Patient did not attend group despite encouraged participation.    Therapeutic Modalities:  Cognitive Behavioral Therapy Person-Centered Therapy Motivational Interviewing    Marcellas Marchant W Hadlee Burback, LCSWA 

## 2022-09-04 NOTE — Progress Notes (Signed)
Recreation Therapy Notes  Date: 09/04/2022  Time: 10:20 am   Location: Craft room    Behavioral response: N/A   Intervention Topic: Self-care     Discussion/Intervention: Patient refused to attend group.   Clinical Observations/Feedback:  Patient refused to attend group.   Dolores Ewing LRT/CTRS         [facility] 09/04/2022 12:22 PM

## 2022-09-04 NOTE — Plan of Care (Signed)
Patient appropriate with staff & peers. No aggressive behaviors noted. Compliant with medications. Appetite and energy level good. ADLs maintained. Support and encouragement given.

## 2022-09-04 NOTE — [facility] Group Notes (Signed)
Ensign Group Notes:  (Nursing/MHT/Case Management/Adjunct)  Date:  09/04/2022  Time:  9:10 AM  Type of Therapy:   Community Meeting  Participation Level:  Did Not Attend   Adela Lank [facility] 09/04/2022, 9:10 AM

## 2022-09-04 NOTE — Progress Notes (Signed)
[facility] MD Progress Note  09/04/2022 11:19 AM Adam [name]  MRN:  [id number] Subjective: Patient seen for follow-up.  No change presentation.  No new complaints. Principal Problem: Schizophrenia, chronic condition with acute exacerbation ([place]) Diagnosis: Principal Problem:   Schizophrenia, chronic condition with acute exacerbation ([place])  Total Time spent with patient: 30 minutes  Past Psychiatric History: Past history of schizophrenia  Past Medical History:  Past Medical History:  Diagnosis Date   Myocardial infarction ([place])    Schizophrenia ([place])    Stroke ([place])    TBI (traumatic brain injury) ([place])    History reviewed. No pertinent surgical history. Family History: History reviewed. No pertinent family history. Family Psychiatric  History: See previous Social History:  Social History   Substance and Sexual Activity  Alcohol Use Not Currently     Social History   Substance and Sexual Activity  Drug Use Not Currently    Social History   Socioeconomic History   Marital status: Single    Spouse name: Not on file   Number of children: Not on file   Years of education: Not on file   Highest education level: Not on file  Occupational History   Not on file  Tobacco Use   Smoking status: Never    Passive exposure: Never   Smokeless tobacco: Never  Vaping Use   Vaping Use: Unknown  Substance and Sexual Activity   Alcohol use: Not Currently   Drug use: Not Currently   Sexual activity: Not Currently  Other Topics Concern   Not on file  Social History Narrative   Not on file   Social Determinants of Health   Financial Resource Strain: Not on file  Food Insecurity: Not on file  Transportation Needs: Not on file  Physical Activity: Not on file  Stress: Not on file  Social Connections: Not on file   Additional Social History:  Specify valuables returned:  (none)                      Sleep: Fair  Appetite:  Fair  Current Medications: Current  Facility-Administered Medications  Medication Dose Route Frequency Provider Last Rate Last Admin   acetaminophen (TYLENOL) tablet 650 mg  650 mg Oral Q6H PRN Kwane Rohl T, MD   650 mg at 08/18/22 0028   alum & mag hydroxide-simeth (MAALOX/MYLANTA) 200-200-20 MG/5ML suspension 30 mL  30 mL Oral Q4H PRN Bryauna Byrum T, MD   30 mL at 04/26/22 7106   aspirin EC tablet 81 mg  81 mg Oral Daily Joss Friedel, [name], MD   81 mg at 09/04/22 0743   atorvastatin (LIPITOR) tablet 20 mg  20 mg Oral Daily Camani Sesay T, MD   20 mg at 09/04/22 0743   atropine 1 % ophthalmic solution 1 drop  1 drop Sublingual QID [name], MD   1 drop at 09/04/22 0743   benztropine (COGENTIN) tablet 0.5 mg  0.5 mg Oral BID Merril Isakson T, MD   0.5 mg at 09/04/22 0743   clonazePAM (KLONOPIN) tablet 1 mg  1 mg Oral TID PRN He, [name], MD   1 mg at 08/24/22 2035   cloZAPine (CLOZARIL) tablet 300 mg  300 mg Oral QHS Loyalty Arentz T, MD   300 mg at 09/03/22 2101   diphenhydrAMINE (BENADRYL) capsule 50 mg  50 mg Oral Q6H PRN Lessly Stigler, [name], MD   50 mg at 09/01/22 2111   Or   diphenhydrAMINE (BENADRYL) injection 50 mg  50 mg Intramuscular Q6H PRN Storey Stangeland T, MD       feeding supplement (ENSURE ENLIVE / ENSURE PLUS) liquid 237 mL  237 mL Oral TID BM Daylan Juhnke T, MD   237 mL at 09/03/22 2101   gabapentin (NEURONTIN) capsule 300 mg  300 mg Oral TID Breven Guidroz, [name], MD   300 mg at 09/04/22 [id number]   haloperidol (HALDOL) tablet 2 mg  2 mg Oral TID Ulah Olmo, [name], MD   2 mg at 09/04/22 [id number]   haloperidol (HALDOL) tablet 5 mg  5 mg Oral Q6H PRN Ariz Terrones, [name], MD   5 mg at 08/21/22 0013   Or   haloperidol lactate (HALDOL) injection 5 mg  5 mg Intramuscular Q6H PRN Tea Collums T, MD       lisinopril (ZESTRIL) tablet 5 mg  5 mg Oral Daily Jamee Pacholski T, MD   5 mg at 09/04/22 0742   magnesium hydroxide (MILK OF MAGNESIA) suspension 30 mL  30 mL Oral Daily PRN Joci Dress T, MD   30 mL at 05/02/22 0627   metoprolol succinate  (TOPROL-XL) 24 hr tablet 25 mg  25 mg Oral Daily Deltha Bernales T, MD   25 mg at 09/04/22 0742   ondansetron (ZOFRAN) tablet 4 mg  4 mg Oral Q8H PRN Deloria Lair, NP   4 mg at 08/18/22 0052   paliperidone (INVEGA SUSTENNA) injection 156 mg  156 mg Intramuscular Q28 days Cliford Sequeira, [name], MD   156 mg at 08/16/22 0833   temazepam (RESTORIL) capsule 15 mg  15 mg Oral QHS Artez Regis T, MD   15 mg at 09/03/22 2101   ziprasidone (GEODON) injection 20 mg  20 mg Intramuscular Q12H PRN Curtisha Bendix, [name], MD   20 mg at 07/08/22 1453    Lab Results: No results found for this or any previous visit (from the past 48 hour(s)).  Blood Alcohol level:  Lab Results  Component Value Date   ETH <10 [id number]    Metabolic Disorder Labs: Lab Results  Component Value Date   HGBA1C 5.5 04/10/2022   MPG 111.15 04/10/2022   No results found for: "PROLACTIN" Lab Results  Component Value Date   CHOL 167 11/20/2021   TRIG 107 11/20/2021   HDL 26 (L) 11/20/2021   CHOLHDL 6.4 11/20/2021   VLDL 21 11/20/2021   LDLCALC 120 (H) 11/20/2021    Physical Findings: AIMS: Facial and Oral Movements Muscles of Facial Expression: None, normal Lips and Perioral Area: None, normal Jaw: None, normal Tongue: None, normal,Extremity Movements Upper (arms, wrists, hands, fingers): None, normal Lower (legs, knees, ankles, toes): None, normal, Trunk Movements Neck, shoulders, hips: None, normal, Overall Severity Severity of abnormal movements (highest score from questions above): None, normal Incapacitation due to abnormal movements: None, normal Patient's awareness of abnormal movements (rate only patient's report): No Awareness, Dental Status Current problems with teeth and/or dentures?: No Does patient usually wear dentures?: No  CIWA:    COWS:     Musculoskeletal: Strength & Muscle Tone: within normal limits Gait & Station: normal Patient leans: N/A  Psychiatric Specialty Exam:  Presentation  General  Appearance:  Appropriate for Environment; Casual; Neat; Well Groomed  Eye Contact: Fair  Speech: Slow  Speech Volume: Decreased  Handedness: Left   Mood and Affect  Mood: Anxious  Affect: Congruent   Thought Process  Thought Processes: Goal Directed  Descriptions of Associations:Circumstantial  Orientation:Partial  Thought Content:Scattered  History of Schizophrenia/Schizoaffective disorder:Yes  Duration of Psychotic Symptoms:Greater than six months  Hallucinations:No data recorded Ideas of Reference:None  Suicidal Thoughts:No data recorded Homicidal Thoughts:No data recorded  Sensorium  Memory: Remote Good  Judgment: Fair  Insight: Fair   Materials engineer: Fair  Attention Span: Fair  Recall: [employer] of Knowledge: Fair  Language: Fair   Psychomotor Activity  Psychomotor Activity:No data recorded  Assets  Assets:No data recorded  Sleep  Sleep:No data recorded   Physical Exam: Physical Exam Vitals and nursing note reviewed.  Constitutional:      Appearance: Normal appearance.  HENT:     Head: Normocephalic and atraumatic.     Mouth/Throat:     Pharynx: Oropharynx is clear.  Eyes:     Pupils: Pupils are equal, round, and reactive to light.  Cardiovascular:     Rate and Rhythm: Normal rate and regular rhythm.  Pulmonary:     Effort: Pulmonary effort is normal.     Breath sounds: Normal breath sounds.  Abdominal:     General: Abdomen is flat.     Palpations: Abdomen is soft.  Musculoskeletal:        General: Normal range of motion.  Skin:    General: Skin is warm and dry.  Neurological:     General: No focal deficit present.     Mental Status: He is alert. Mental status is at baseline.  Psychiatric:        Speech: He is noncommunicative.    Review of Systems  Unable to perform ROS: Language   Blood pressure 121/80, pulse 95, temperature 97.7 F (36.5 C), temperature source Oral, resp.  rate 20, height 5\' 8"  (1.727 m), weight 85.8 kg, SpO2 100 %. Body mass index is 28.76 kg/m.   Treatment Plan Summary: Medication management and Plan completely stable.  No need for any change to treatment plan.  Awaiting discharge  Alethia Berthold, MD 09/04/2022, 11:19 AM

## 2022-09-05 NOTE — Plan of Care (Signed)
  Problem: Self-Concept: Goal: Level of anxiety will decrease Outcome: Progressing Goal: Ability to modify response to factors that promote anxiety will improve Outcome: Progressing   Problem: Nutrition: Goal: Adequate nutrition will be maintained Outcome: Progressing   Problem: Coping: Goal: Level of anxiety will decrease Outcome: Progressing   Problem: Pain Managment: Goal: General experience of comfort will improve Outcome: Progressing   Problem: Skin Integrity: Goal: Risk for impaired skin integrity will decrease Outcome: Progressing   Problem: Activity: Goal: Interest or engagement in leisure activities will improve Outcome: Progressing   Problem: Coping: Goal: Coping ability will improve Outcome: Progressing

## 2022-09-05 NOTE — Progress Notes (Signed)
[facility] MD Progress Note  09/05/2022 2:48 PM Adam [name]  MRN:  [id number] Subjective: No new complaints no change in behavior Principal Problem: Schizophrenia, chronic condition with acute exacerbation (HCC) Diagnosis: Principal Problem:   Schizophrenia, chronic condition with acute exacerbation (HCC)  Total Time spent with patient: 30 minutes  Past Psychiatric History: Past history of schizophrenia  Past Medical History:  Past Medical History:  Diagnosis Date   Myocardial infarction (HCC)    Schizophrenia (HCC)    Stroke (HCC)    TBI (traumatic brain injury) (HCC)    History reviewed. No pertinent surgical history. Family History: History reviewed. No pertinent family history. Family Psychiatric  History: None Social History:  Social History   Substance and Sexual Activity  Alcohol Use Not Currently     Social History   Substance and Sexual Activity  Drug Use Not Currently    Social History   Socioeconomic History   Marital status: Single    Spouse name: Not on file   Number of children: Not on file   Years of education: Not on file   Highest education level: Not on file  Occupational History   Not on file  Tobacco Use   Smoking status: Never    Passive exposure: Never   Smokeless tobacco: Never  Vaping Use   Vaping Use: Unknown  Substance and Sexual Activity   Alcohol use: Not Currently   Drug use: Not Currently   Sexual activity: Not Currently  Other Topics Concern   Not on file  Social History Narrative   Not on file   Social Determinants of Health   Financial Resource Strain: Not on file  Food Insecurity: Not on file  Transportation Needs: Not on file  Physical Activity: Not on file  Stress: Not on file  Social Connections: Not on file   Additional Social History:  Specify valuables returned:  (none)                      Sleep: Good  Appetite:  Good  Current Medications: Current Facility-Administered Medications  Medication Dose  Route Frequency Provider Last Rate Last Admin   acetaminophen (TYLENOL) tablet 650 mg  650 mg Oral Q6H PRN Jayr Lupercio T, MD   650 mg at 08/18/22 0028   alum & mag hydroxide-simeth (MAALOX/MYLANTA) 200-200-20 MG/5ML suspension 30 mL  30 mL Oral Q4H PRN Kennedee Kitzmiller T, MD   30 mL at 04/26/22 5053   aspirin EC tablet 81 mg  81 mg Oral Daily Arabelle Bollig, [name], MD   81 mg at 09/05/22 0906   atorvastatin (LIPITOR) tablet 20 mg  20 mg Oral Daily Zada Haser T, MD   20 mg at 09/05/22 0906   atropine 1 % ophthalmic solution 1 drop  1 drop Sublingual QID [name], MD   1 drop at 09/05/22 1254   benztropine (COGENTIN) tablet 0.5 mg  0.5 mg Oral BID Josephanthony Tindel T, MD   0.5 mg at 09/05/22 0906   clonazePAM (KLONOPIN) tablet 1 mg  1 mg Oral TID PRN He, [name], MD   1 mg at 08/24/22 2035   cloZAPine (CLOZARIL) tablet 300 mg  300 mg Oral QHS Naydeline Morace T, MD   300 mg at 09/04/22 2116   diphenhydrAMINE (BENADRYL) capsule 50 mg  50 mg Oral Q6H PRN Dimitry Holsworth, [name], MD   50 mg at 09/01/22 2111   Or   diphenhydrAMINE (BENADRYL) injection 50 mg  50 mg Intramuscular Q6H PRN  Domonick Sittner, [name], MD       feeding supplement (ENSURE ENLIVE / ENSURE PLUS) liquid 237 mL  237 mL Oral TID BM Hara Milholland T, MD   237 mL at 09/05/22 1406   gabapentin (NEURONTIN) capsule 300 mg  300 mg Oral TID Meriah Shands, [name], MD   300 mg at 09/05/22 1254   haloperidol (HALDOL) tablet 2 mg  2 mg Oral TID Geonna Lockyer, [name], MD   2 mg at 09/05/22 1253   haloperidol (HALDOL) tablet 5 mg  5 mg Oral Q6H PRN Theodoros Stjames, [name], MD   5 mg at 08/21/22 0013   Or   haloperidol lactate (HALDOL) injection 5 mg  5 mg Intramuscular Q6H PRN Edel Rivero, [name], MD       lisinopril (ZESTRIL) tablet 5 mg  5 mg Oral Daily Brandace Cargle, [name], MD   5 mg at 09/05/22 0905   magnesium hydroxide (MILK OF MAGNESIA) suspension 30 mL  30 mL Oral Daily PRN Hieu Herms T, MD   30 mL at 05/02/22 7425   metoprolol succinate (TOPROL-XL) 24 hr tablet 25 mg  25 mg Oral Daily  Surah Pelley T, MD   25 mg at 09/05/22 0906   ondansetron (ZOFRAN) tablet 4 mg  4 mg Oral Q8H PRN Deloria Lair, NP   4 mg at 08/18/22 0052   paliperidone (INVEGA SUSTENNA) injection 156 mg  156 mg Intramuscular Q28 days Margan Elias T, MD   156 mg at 08/16/22 0833   temazepam (RESTORIL) capsule 15 mg  15 mg Oral QHS Jeni Duling T, MD   15 mg at 09/04/22 2116   ziprasidone (GEODON) injection 20 mg  20 mg Intramuscular Q12H PRN Dat Derksen, [name], MD   20 mg at 07/08/22 1453    Lab Results:  Results for orders placed or performed during the hospital encounter of 03/19/22 (from the past 48 hour(s))  CBC with Differential/Platelet     Status: Abnormal   Collection Time: 09/04/22 12:25 PM  Result Value Ref Range   WBC 4.5 4.0 - 10.5 K/uL   RBC 4.29 4.22 - 5.81 MIL/uL   Hemoglobin 13.3 13.0 - 17.0 g/dL   HCT 38.5 (L) 39.0 - 52.0 %   MCV 89.7 80.0 - 100.0 fL   MCH 31.0 26.0 - 34.0 pg   MCHC 34.5 30.0 - 36.0 g/dL   RDW 14.1 11.5 - 15.5 %   Platelets 162 150 - 400 K/uL   nRBC 0.0 0.0 - 0.2 %   Neutrophils Relative % 67 %   Neutro Abs 3.1 1.7 - 7.7 K/uL   Lymphocytes Relative 22 %   Lymphs Abs 1.0 0.7 - 4.0 K/uL   Monocytes Relative 8 %   Monocytes Absolute 0.4 0.1 - 1.0 K/uL   Eosinophils Relative 1 %   Eosinophils Absolute 0.1 0.0 - 0.5 K/uL   Basophils Relative 2 %   Basophils Absolute 0.1 0.0 - 0.1 K/uL   Immature Granulocytes 0 %   Abs Immature Granulocytes 0.01 0.00 - 0.07 K/uL    Comment: Performed at [facility], [address] [place],  [address]    Blood Alcohol level:  Lab Results  Component Value Date   [facility] <10 75/64/3329    Metabolic Disorder Labs: Lab Results  Component Value Date   HGBA1C 5.5 04/10/2022   MPG 111.15 04/10/2022   No results found for: "PROLACTIN" Lab Results  Component Value Date   CHOL 167 11/20/2021   TRIG  107 11/20/2021   HDL 26 (L) 11/20/2021   CHOLHDL 6.4 11/20/2021   VLDL 21 11/20/2021   LDLCALC 120 (H)  11/20/2021    Physical Findings: AIMS: Facial and Oral Movements Muscles of Facial Expression: None, normal Lips and Perioral Area: None, normal Jaw: None, normal Tongue: None, normal,Extremity Movements Upper (arms, wrists, hands, fingers): None, normal Lower (legs, knees, ankles, toes): None, normal, Trunk Movements Neck, shoulders, hips: None, normal, Overall Severity Severity of abnormal movements (highest score from questions above): None, normal Incapacitation due to abnormal movements: None, normal Patient's awareness of abnormal movements (rate only patient's report): No Awareness, Dental Status Current problems with teeth and/or dentures?: No Does patient usually wear dentures?: No  CIWA:    COWS:     Musculoskeletal: Strength & Muscle Tone: within normal limits Gait & Station: normal Patient leans: N/A  Psychiatric Specialty Exam:  Presentation  General Appearance:  Appropriate for Environment; Casual; Neat; Well Groomed  Eye Contact: Fair  Speech: Slow  Speech Volume: Decreased  Handedness: Left   Mood and Affect  Mood: Anxious  Affect: Congruent   Thought Process  Thought Processes: Goal Directed  Descriptions of Associations:Circumstantial  Orientation:Partial  Thought Content:Scattered  History of Schizophrenia/Schizoaffective disorder:Yes  Duration of Psychotic Symptoms:Greater than six months  Hallucinations:No data recorded Ideas of Reference:None  Suicidal Thoughts:No data recorded Homicidal Thoughts:No data recorded  Sensorium  Memory: Remote Good  Judgment: Fair  Insight: Fair   Art therapist  Concentration: Fair  Attention Span: Fair  Recall: [employer] of Knowledge: Fair  Language: Fair   Psychomotor Activity  Psychomotor Activity:No data recorded  Assets  Assets:No data recorded  Sleep  Sleep:No data recorded   Physical Exam: Physical Exam Vitals and nursing note reviewed.   Constitutional:      Appearance: Normal appearance.  HENT:     Head: Normocephalic and atraumatic.     Mouth/Throat:     Pharynx: Oropharynx is clear.  Eyes:     Pupils: Pupils are equal, round, and reactive to light.  Cardiovascular:     Rate and Rhythm: Normal rate and regular rhythm.  Pulmonary:     Effort: Pulmonary effort is normal.     Breath sounds: Normal breath sounds.  Abdominal:     General: Abdomen is flat.     Palpations: Abdomen is soft.  Musculoskeletal:        General: Normal range of motion.  Skin:    General: Skin is warm and dry.  Neurological:     General: No focal deficit present.     Mental Status: He is alert. Mental status is at baseline.  Psychiatric:        Speech: He is noncommunicative.        Thought Content: Thought content normal.    Review of Systems  Unable to perform ROS: Language  Neurological: Negative.    Blood pressure 124/89, pulse 93, temperature 98.3 F (36.8 C), temperature source Oral, resp. rate 18, height 5\' 8"  (1.727 m), weight 85.8 kg, SpO2 100 %. Body mass index is 28.76 kg/m.   Treatment Plan Summary: Medication management and Plan no change to medication management.  Daily supportive therapy and encouragement and review with treatment team.  Patient is very stable and ready for discharge when placement can be found  , MD 09/05/2022, 2:48 PM

## 2022-09-05 NOTE — TOC Progression Note (Signed)
Transition of Care [facility]) - Progression Note    Patient Details  Name: Adam [name] MRN: [id number] Date of Birth: Dec 12, 1960  Transition of Care [facility]) CM/SW [place], LCSW Phone Number: 09/05/2022, 12:02 PM  Clinical Narrative:  The program manager from the neurom[facility] treatment centers requested information on patient's communication. Received information from Dr. Weber Cooks and provided updated.   Expected Discharge Plan and Services                                                 Social Determinants of Health (SDOH) Interventions    Readmission Risk Interventions     No data to display

## 2022-09-05 NOTE — Progress Notes (Signed)
Patient in a good mood this evening.  In the dayroom hanging out with the other patients.  He is med compliant and received his meds without issue.  Will continue to offer support, encouragement and redirection as needed. Safe on the unit with q15 minute safety checks.    C Butler-Nicholson, LPN

## 2022-09-05 NOTE — Plan of Care (Signed)
D: Patient alert and oriented. Patient denies pain. Patient denies anxiety and depression. Patient denies SI/HI/AVH. Patient interactive with staff upon approach. Patient observed frequently in the dayroom watching television.  A: Scheduled medications administered to patient, per MD orders.  Support and encouragement provided to patient.  Q15 minute safety checks maintained.   R: Patient compliant with medication administration and treatment plan. No adverse drug reactions noted. Patient remains safe on the unit at this time.  Problem: Education: Goal: Knowledge of General Education information will improve Description: Including pain rating scale, medication(s)/side effects and non-pharmacologic comfort measures Outcome: Progressing   Problem: Clinical Measurements: Goal: Ability to maintain clinical measurements within normal limits will improve Outcome: Progressing   Problem: Safety: Goal: Ability to remain free from injury will improve Outcome: Progressing   Problem: Activity: Goal: Interest or engagement in leisure activities will improve Outcome: Progressing   Problem: Education: Goal: Will be free of psychotic symptoms Outcome: Progressing

## 2022-09-06 NOTE — Progress Notes (Signed)
Patient came up and sat in the chair outside of the nurses station/medication window, and then got up and walked back to his room. This writer went to go see if he wanted to eat his breakfast and he said "no". This writer proceeds to ask patient if he wanted to take his medicine and he also stated "no". This Probation officer will give patient some time to see if he will come around to take his medication. MD was notified.

## 2022-09-06 NOTE — Progress Notes (Signed)
Recreation Therapy Notes   Date: 09/06/2022  Time: 10:40 am   Location: Courtyard       Behavioral response: N/A   Intervention Topic: Leisure   Discussion/Intervention: Patient refused to attend group.   Clinical Observations/Feedback:  Patient refused to attend group.    Malcomb Gangemi LRT/CTRS         Adam [name] 09/06/2022 12:40 PM 

## 2022-09-06 NOTE — Progress Notes (Signed)
Patient came to nurses station with shaving cream, looking at this writer. This Probation officer knows from previous encounters, that patient wants to shave. This Probation officer went with patient and assisted him with shaving his face. Patient tolerated shaving well, without any issues. Patient smiled and thanked this Probation officer upon [employer]. Patient remains safe on the unit and staff will continue to monitor for safety.

## 2022-09-06 NOTE — Progress Notes (Signed)
[facility] MD Progress Note  09/06/2022 11:35 AM Adam [name]  MRN:  [id number] Subjective: Patient seen for follow-up.  Staff and myself note that the patient is in something of an irritated or depressed mood today.  Got up grumpy with nursing.  When I went to see him he was sitting in his room looking unhappy and irritable.  Said the word "tired" multiple times eventually yelling it.  I suggested that he might mean that he was tired of being here and was angry about it.  As always I am not certain if I got that right but he seemed to sort of acknowledge it.  Unclear if there is any other specific issue that would be upsetting him.  I ask about his mother and he seemed to indicate there was no problem with that.  Did not indicate any specific physical complaint. Principal Problem: Schizophrenia, chronic condition with acute exacerbation ([place]) Diagnosis: Principal Problem:   Schizophrenia, chronic condition with acute exacerbation ([place])  Total Time spent with patient: 30 minutes  Past Psychiatric History: History of schizophrenia.  Has been in the hospital for over a year now with virtually all of it focused on discharge planning that still has not materialized  Past Medical History:  Past Medical History:  Diagnosis Date   Myocardial infarction ([place])    Schizophrenia ([place])    Stroke ([place])    TBI (traumatic brain injury) ([place])    History reviewed. No pertinent surgical history. Family History: History reviewed. No pertinent family history. Family Psychiatric  History: None Social History:  Social History   Substance and Sexual Activity  Alcohol Use Not Currently     Social History   Substance and Sexual Activity  Drug Use Not Currently    Social History   Socioeconomic History   Marital status: Single    Spouse name: Not on file   Number of children: Not on file   Years of education: Not on file   Highest education level: Not on file  Occupational History   Not on file  Tobacco Use    Smoking status: Never    Passive exposure: Never   Smokeless tobacco: Never  Vaping Use   Vaping Use: Unknown  Substance and Sexual Activity   Alcohol use: Not Currently   Drug use: Not Currently   Sexual activity: Not Currently  Other Topics Concern   Not on file  Social History Narrative   Not on file   Social Determinants of Health   Financial Resource Strain: Not on file  Food Insecurity: Not on file  Transportation Needs: Not on file  Physical Activity: Not on file  Stress: Not on file  Social Connections: Not on file   Additional Social History:  Specify valuables returned:  (none)                      Sleep: Fair  Appetite:  Fair  Current Medications: Current Facility-Administered Medications  Medication Dose Route Frequency Provider Last Rate Last Admin   acetaminophen (TYLENOL) tablet 650 mg  650 mg Oral Q6H PRN Mailyn Steichen T, MD   650 mg at 08/18/22 0028   alum & mag hydroxide-simeth (MAALOX/MYLANTA) 200-200-20 MG/5ML suspension 30 mL  30 mL Oral Q4H PRN Sarely Stracener T, MD   30 mL at 04/26/22 0721   aspirin EC tablet 81 mg  81 mg Oral Daily Nahjae Hoeg T, MD   81 mg at 09/06/22 1032   atorvastatin (LIPITOR) tablet 20  mg  20 mg Oral Daily Araseli Sherry, [name], MD   20 mg at 09/06/22 1032   atropine 1 % ophthalmic solution 1 drop  1 drop Sublingual QID [name], MD   1 drop at 09/06/22 1031   benztropine (COGENTIN) tablet 0.5 mg  0.5 mg Oral BID Stevee Valenta, [name], MD   0.5 mg at 09/06/22 1032   clonazePAM (KLONOPIN) tablet 1 mg  1 mg Oral TID PRN He, [name], MD   1 mg at 08/24/22 2035   cloZAPine (CLOZARIL) tablet 300 mg  300 mg Oral QHS Jamir Rone, [name], MD   300 mg at 09/05/22 2122   diphenhydrAMINE (BENADRYL) capsule 50 mg  50 mg Oral Q6H PRN Herndon Grill, [name], MD   50 mg at 09/01/22 2111   Or   diphenhydrAMINE (BENADRYL) injection 50 mg  50 mg Intramuscular Q6H PRN Charl Wellen, [name], MD       feeding supplement (ENSURE ENLIVE / ENSURE PLUS) liquid 237 mL   237 mL Oral TID BM Kem Hensen T, MD   237 mL at 09/06/22 1032   gabapentin (NEURONTIN) capsule 300 mg  300 mg Oral TID Joab Carden T, MD   300 mg at 09/06/22 1032   haloperidol (HALDOL) tablet 2 mg  2 mg Oral TID Orilla Templeman, [name], MD   2 mg at 09/06/22 1032   haloperidol (HALDOL) tablet 5 mg  5 mg Oral Q6H PRN Jackqueline Aquilar, [name], MD   5 mg at 08/21/22 0013   Or   haloperidol lactate (HALDOL) injection 5 mg  5 mg Intramuscular Q6H PRN Elizibeth Breau, [name], MD       lisinopril (ZESTRIL) tablet 5 mg  5 mg Oral Daily Shalev Helminiak T, MD   5 mg at 09/06/22 1032   magnesium hydroxide (MILK OF MAGNESIA) suspension 30 mL  30 mL Oral Daily PRN Volanda Mangine, [name], MD   30 mL at 05/02/22 [id number]   metoprolol succinate (TOPROL-XL) 24 hr tablet 25 mg  25 mg Oral Daily Shogo Larkey T, MD   25 mg at 09/06/22 1032   ondansetron (ZOFRAN) tablet 4 mg  4 mg Oral Q8H PRN Deloria Lair, NP   4 mg at 08/18/22 0052   paliperidone (INVEGA SUSTENNA) injection 156 mg  156 mg Intramuscular Q28 days Evelyn Aguinaldo, [name], MD   156 mg at 08/16/22 0833   temazepam (RESTORIL) capsule 15 mg  15 mg Oral QHS Jozlin Bently T, MD   15 mg at 09/05/22 2122   ziprasidone (GEODON) injection 20 mg  20 mg Intramuscular Q12H PRN Kamisha Ell, [name], MD   20 mg at 07/08/22 1453    Lab Results:  Results for orders placed or performed during the hospital encounter of 03/19/22 (from the past 48 hour(s))  CBC with Differential/Platelet     Status: Abnormal   Collection Time: 09/04/22 12:25 PM  Result Value Ref Range   WBC 4.5 4.0 - 10.5 K/uL   RBC 4.29 4.22 - 5.81 MIL/uL   Hemoglobin 13.3 13.0 - 17.0 g/dL   HCT 38.5 (L) 39.0 - 52.0 %   MCV 89.7 80.0 - 100.0 fL   MCH 31.0 26.0 - 34.0 pg   MCHC 34.5 30.0 - 36.0 g/dL   RDW 14.1 11.5 - 15.5 %   Platelets 162 150 - 400 K/uL   nRBC 0.0 0.0 - 0.2 %   Neutrophils Relative % 67 %   Neutro Abs 3.1 1.7 - 7.7 K/uL   Lymphocytes  Relative 22 %   Lymphs Abs 1.0 0.7 - 4.0 K/uL   Monocytes Relative 8 %    Monocytes Absolute 0.4 0.1 - 1.0 K/uL   Eosinophils Relative 1 %   Eosinophils Absolute 0.1 0.0 - 0.5 K/uL   Basophils Relative 2 %   Basophils Absolute 0.1 0.0 - 0.1 K/uL   Immature Granulocytes 0 %   Abs Immature Granulocytes 0.01 0.00 - 0.07 K/uL    Comment: Performed at [facility], [address] [place], [place] [address]    Blood Alcohol level:  Lab Results  Component Value Date   [facility] <10 [id number]    Metabolic Disorder Labs: Lab Results  Component Value Date   HGBA1C 5.5 04/10/2022   MPG 111.15 04/10/2022   No results found for: "PROLACTIN" Lab Results  Component Value Date   CHOL 167 11/20/2021   TRIG 107 11/20/2021   HDL 26 (L) 11/20/2021   CHOLHDL 6.4 11/20/2021   VLDL 21 11/20/2021   LDLCALC 120 (H) 11/20/2021    Physical Findings: AIMS: Facial and Oral Movements Muscles of Facial Expression: None, normal Lips and Perioral Area: None, normal Jaw: None, normal Tongue: None, normal,Extremity Movements Upper (arms, wrists, hands, fingers): None, normal Lower (legs, knees, ankles, toes): None, normal, Trunk Movements Neck, shoulders, hips: None, normal, Overall Severity Severity of abnormal movements (highest score from questions above): None, normal Incapacitation due to abnormal movements: None, normal Patient's awareness of abnormal movements (rate only patient's report): No Awareness, Dental Status Current problems with teeth and/or dentures?: No Does patient usually wear dentures?: No  CIWA:    COWS:     Musculoskeletal: Strength & Muscle Tone: within normal limits Gait & Station: normal Patient leans: N/A  Psychiatric Specialty Exam:  Presentation  General Appearance:  Appropriate for Environment; Casual; Neat; Well Groomed  Eye Contact: Fair  Speech: Slow  Speech Volume: Decreased  Handedness: Left   Mood and Affect  Mood: Anxious  Affect: Congruent   Thought Process  Thought Processes: Goal  Directed  Descriptions of Associations:Circumstantial  Orientation:Partial  Thought Content:Scattered  History of Schizophrenia/Schizoaffective disorder:Yes  Duration of Psychotic Symptoms:Greater than six months  Hallucinations:No data recorded Ideas of Reference:None  Suicidal Thoughts:No data recorded Homicidal Thoughts:No data recorded  Sensorium  Memory: Remote Good  Judgment: Fair  Insight: Fair   Community education officer  Concentration: Fair  Attention Span: Fair  Recall: [place] of Knowledge: Fair  Language: Fair   Psychomotor Activity  Psychomotor Activity:No data recorded  Assets  Assets:No data recorded  Sleep  Sleep:No data recorded   Physical Exam: Physical Exam Vitals reviewed.  Constitutional:      Appearance: Normal appearance.  HENT:     Head: Normocephalic and atraumatic.     Mouth/Throat:     Pharynx: Oropharynx is clear.  Eyes:     Pupils: Pupils are equal, round, and reactive to light.  Cardiovascular:     Rate and Rhythm: Normal rate and regular rhythm.  Pulmonary:     Effort: Pulmonary effort is normal.     Breath sounds: Normal breath sounds.  Abdominal:     General: Abdomen is flat.     Palpations: Abdomen is soft.  Musculoskeletal:        General: Normal range of motion.  Skin:    General: Skin is warm and dry.  Neurological:     General: No focal deficit present.     Mental Status: He is alert. Mental status is at baseline.  Psychiatric:        Attention and Perception: Attention normal.        Mood and Affect: Mood normal. Affect is angry.        Speech: He is noncommunicative.    Review of Systems  Unable to perform ROS: Psychiatric disorder  Constitutional:  Positive for malaise/fatigue.   Blood pressure 125/88, pulse (!) 107, temperature 98 F (36.7 C), temperature source Oral, resp. rate 18, height 5\' 8"  (1.727 m), weight 85.8 kg, SpO2 97 %. Body mass index is 28.76 kg/m.   Treatment Plan  Summary: Plan he is not aggressive or violent or threatening just seems to be in a bad mood.  Tried to offer some gentle support to him and validation he has a good reason to be irritable and angry.  Reminded him that there is as needed medicine if he feels like that would be helpful or if there is anything else we can do to try to let us know in his own way.  Primarily still looking at discharge.  Alethia Berthold, MD 09/06/2022, 11:35 AM

## 2022-09-06 NOTE — Progress Notes (Signed)
Patient is still asleep. This writer will administer scheduled morning medications once he wakes up. MD was notified during progression rounds.

## 2022-09-06 NOTE — Plan of Care (Signed)
  Problem: Education: Goal: Ability to state activities that reduce stress will improve Outcome: Progressing   Problem: Coping: Goal: Ability to identify and develop effective coping behavior will improve Outcome: Progressing   Problem: Self-Concept: Goal: Ability to identify factors that promote anxiety will improve Outcome: Progressing Goal: Level of anxiety will decrease Outcome: Progressing Goal: Ability to modify response to factors that promote anxiety will improve Outcome: Progressing   Problem: Education: Goal: Knowledge of General Education information will improve Description: Including pain rating scale, medication(s)/side effects and non-pharmacologic comfort measures Outcome: Progressing

## 2022-09-06 NOTE — Plan of Care (Signed)
D- Patient alert and oriented to person, place, and situation. Patient slept in until about 10:30 and presented in a preoccupied, but pleasant mood on assessment. Patient did not eat breakfast, but he did eat lunch. Patient has been focused on his cell phone, that was approved by leadership for him to have on the unit. 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 Patient does answer questions with a "yes" or "no". Patient denied SI, HI, AVH, and pain at this time. Patient had no stated goals for today.  A- Scheduled medications administered to patient, per MD orders. Support and encouragement provided.  Routine safety checks conducted every 15 minutes.  Patient informed to notify staff with problems or concerns.  R- No adverse drug reactions noted. Patient contracts for safety at this time. Patient compliant with medications. Patient remains safe at this time.  Problem: Education: Goal: Ability to state activities that reduce stress will improve Outcome: Progressing   Problem: Coping: Goal: Ability to identify and develop effective coping behavior will improve Outcome: Progressing   Problem: Self-Concept: Goal: Ability to identify factors that promote anxiety will improve Outcome: Progressing Goal: Level of anxiety will decrease Outcome: Progressing Goal: Ability to modify response to factors that promote anxiety will improve Outcome: Progressing   Problem: Education: Goal: Knowledge of General Education information will improve Description: Including pain rating scale, medication(s)/side effects and non-pharmacologic comfort measures Outcome: Progressing   Problem: Health Behavior/Discharge Planning: Goal: Ability to manage health-related needs will improve Outcome: Progressing   Problem: Clinical Measurements: Goal: Ability to maintain clinical measurements within normal limits will improve Outcome: Progressing Goal: Will remain free from  infection Outcome: Progressing Goal: Diagnostic test results will improve Outcome: Progressing Goal: Respiratory complications will improve Outcome: Progressing Goal: Cardiovascular complication will be avoided Outcome: Progressing   Problem: Activity: Goal: Risk for activity intolerance will decrease Outcome: Progressing   Problem: Nutrition: Goal: Adequate nutrition will be maintained Outcome: Progressing   Problem: Coping: Goal: Level of anxiety will decrease Outcome: Progressing   Problem: Elimination: Goal: Will not experience complications related to bowel motility Outcome: Progressing Goal: Will not experience complications related to urinary retention Outcome: Progressing   Problem: Pain Managment: Goal: General experience of comfort will improve Outcome: Progressing   Problem: Safety: Goal: Ability to remain free from injury will improve Outcome: Progressing   Problem: Skin Integrity: Goal: Risk for impaired skin integrity will decrease Outcome: Progressing   Problem: Activity: Goal: Interest or engagement in leisure activities will improve Outcome: Progressing   Problem: Coping: Goal: Coping ability will improve Outcome: Progressing   Problem: Education: Goal: Will be free of psychotic symptoms Outcome: Progressing

## 2022-09-06 NOTE — Progress Notes (Signed)
Patient can be heard hollering out, so this writer went to check on him. Patient is still lying in bed, with his eyes closed. He appears to be dreaming and crying/calling out "[name]", the facility that he was staying in prior to his [name] admission. This writer attempted to calm patient down and mentioned that social work is working on finding him somewhere to go. Patient hollered out again, "[name]", and pulled the covers over his head.

## 2022-09-06 NOTE — Progress Notes (Signed)
Patient active on the unit.  Back and forth between his room and the dayroom. In a good mood this evening.  Singing the words Madonna and Illuminati over and over.  He denies si hi avh depression and anxiety. He is med compliant tonight and took his meds without incident. Will continue to monitor with q15 minute safety checks.     C Butler-Nicholson, LPN

## 2022-09-06 NOTE — Progress Notes (Signed)
Patient out and visible through out the unit this evening.  He is in a pleasant mood as he stops by the nurses station to show staff a picture of a cat. Periodically sings out loud as he walks through the halls.  He is med complaint and received his meds without incident.  He denies si hi avh depression and anxiety.  Will continue to offer support and encouragement. Q15 minute safety checks in place.     C Butler-Nicholson, LPN

## 2022-09-06 NOTE — [facility] Counselor (Signed)
CSW spoke with the director of transitions of care (TOC) and was informed that a group home has been identified for potential placement. Director informs CSW that we are working with the [facility] to assist with funding/getting placement.   [name]. Guerry Bruin, MSW, LCSW, [facility] 09/06/2022 3:41 PM

## 2022-09-06 NOTE — Progress Notes (Signed)
Patient at the nurses pointing to the med room.  Requesting medication. Indicates that he can not sleep and is agitated during confrontation.  Staff listed all the medication that was available to him and he chose which ones he wanted to take. Will continue to monitor with q15 minute safety checks.       C Butler-Nicholson, LPN

## 2022-09-07 NOTE — Group Note (Signed)
[facility] LCSW Group Therapy Note   Group Date: 09/07/2022 Start Time: 1300 End Time: 1400  Type of Therapy and Topic:  Group Therapy:  Feelings around Relapse and Recovery  Participation Level:  Did Not Attend   Mood:  Description of Group:    Patients in this group will discuss emotions they experience before and after a relapse. They will process how experiencing these feelings, or avoidance of experiencing them, relates to having a relapse. Facilitator will guide patients to explore emotions they have related to recovery. Patients will be encouraged to process which emotions are more powerful. They will be guided to discuss the emotional reaction significant others in their lives may have to patients' relapse or recovery. Patients will be assisted in exploring ways to respond to the emotions of others without this contributing to a relapse.  Therapeutic Goals: Patient will identify two or more emotions that lead to relapse for them:  Patient will identify two emotions that result when they relapse:  Patient will identify two emotions related to recovery:  Patient will demonstrate ability to communicate their needs through discussion and/or role plays.   Summary of Patient Progress:   Group not held due to complex discharge planning.    Therapeutic Modalities:   Cognitive Behavioral Therapy Solution-Focused Therapy Assertiveness Training Relapse Prevention Therapy   Neha Waight J Hara Milholland, LCSW 

## 2022-09-07 NOTE — Plan of Care (Signed)
D- Patient alert and oriented to person, place and situation. Patient initially presented in a slightly irritable mood, saying "oh my God, no" over and over. However, he did go down to eat breakfast and came to the medication room to receive scheduled medications. 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 He can get a little agitated when staff doesn't know what he's trying to say, but once we figure it out, he starts to smile and say "oh yeah". Patient filled out his self-inventory and denied any signs/symptoms of depression/anxiety. Patient also denied SI, HI, AVH, and pain. Patient had no stated goals for today.  A- Scheduled medications administered to patient, per MD orders. Support and encouragement provided.  Routine safety checks conducted every 15 minutes.  Patient informed to notify staff with problems or concerns.  R- No adverse drug reactions noted. Patient contracts for safety at this time. Patient compliant with medications. Patient receptive, calm, and cooperative. Patient remains safe at this time.  Problem: Education: Goal: Ability to state activities that reduce stress will improve Outcome: Progressing   Problem: Coping: Goal: Ability to identify and develop effective coping behavior will improve Outcome: Progressing   Problem: Self-Concept: Goal: Ability to identify factors that promote anxiety will improve Outcome: Progressing Goal: Level of anxiety will decrease Outcome: Progressing Goal: Ability to modify response to factors that promote anxiety will improve Outcome: Progressing   Problem: Education: Goal: Knowledge of General Education information will improve Description: Including pain rating scale, medication(s)/side effects and non-pharmacologic comfort measures Outcome: Progressing   Problem: Health Behavior/Discharge Planning: Goal: Ability to manage health-related needs will improve Outcome: Progressing   Problem: Clinical  Measurements: Goal: Ability to maintain clinical measurements within normal limits will improve Outcome: Progressing Goal: Will remain free from infection Outcome: Progressing Goal: Diagnostic test results will improve Outcome: Progressing Goal: Respiratory complications will improve Outcome: Progressing Goal: Cardiovascular complication will be avoided Outcome: Progressing   Problem: Activity: Goal: Risk for activity intolerance will decrease Outcome: Progressing   Problem: Nutrition: Goal: Adequate nutrition will be maintained Outcome: Progressing   Problem: Coping: Goal: Level of anxiety will decrease Outcome: Progressing   Problem: Elimination: Goal: Will not experience complications related to bowel motility Outcome: Progressing Goal: Will not experience complications related to urinary retention Outcome: Progressing   Problem: Pain Managment: Goal: General experience of comfort will improve Outcome: Progressing   Problem: Safety: Goal: Ability to remain free from injury will improve Outcome: Progressing   Problem: Skin Integrity: Goal: Risk for impaired skin integrity will decrease Outcome: Progressing   Problem: Activity: Goal: Interest or engagement in leisure activities will improve Outcome: Progressing   Problem: Coping: Goal: Coping ability will improve Outcome: Progressing   Problem: Education: Goal: Will be free of psychotic symptoms Outcome: Progressing

## 2022-09-07 NOTE — Progress Notes (Signed)
Recreation Therapy Notes   Date: 09/07/2022  Time: 11:00 am   Location: Craft room       Behavioral response: N/A   Intervention Topic: Stress Management    Discussion/Intervention: Patient refused to attend group.   Clinical Observations/Feedback:  Patient refused to attend group.    Arely Tinner LRT/CTRS        Severn Goddard 09/07/2022 12:37 PM

## 2022-09-07 NOTE — Progress Notes (Signed)
[facility] MD Progress Note  09/07/2022 5:28 PM Adam [name]  MRN:  [id number] Subjective: No new complaint no change to presentation Principal Problem: Schizophrenia, chronic condition with acute exacerbation ([place]) Diagnosis: Principal Problem:   Schizophrenia, chronic condition with acute exacerbation ([place])  Total Time spent with patient: 30 minutes  Past Psychiatric History: Past history of schizophrenia  Past Medical History:  Past Medical History:  Diagnosis Date   Myocardial infarction ([place])    Schizophrenia ([place])    Stroke ([place])    TBI (traumatic brain injury) ([place])    History reviewed. No pertinent surgical history. Family History: History reviewed. No pertinent family history. Family Psychiatric  History: None Social History:  Social History   Substance and Sexual Activity  Alcohol Use Not Currently     Social History   Substance and Sexual Activity  Drug Use Not Currently    Social History   Socioeconomic History   Marital status: Single    Spouse name: Not on file   Number of children: Not on file   Years of education: Not on file   Highest education level: Not on file  Occupational History   Not on file  Tobacco Use   Smoking status: Never    Passive exposure: Never   Smokeless tobacco: Never  Vaping Use   Vaping Use: Unknown  Substance and Sexual Activity   Alcohol use: Not Currently   Drug use: Not Currently   Sexual activity: Not Currently  Other Topics Concern   Not on file  Social History Narrative   Not on file   Social Determinants of Health   Financial Resource Strain: Not on file  Food Insecurity: Not on file  Transportation Needs: Not on file  Physical Activity: Not on file  Stress: Not on file  Social Connections: Not on file   Additional Social History:  Specify valuables returned:  (none)                      Sleep: Fair  Appetite:  Fair  Current Medications: Current Facility-Administered Medications  Medication  Dose Route Frequency Provider Last Rate Last Admin   acetaminophen (TYLENOL) tablet 650 mg  650 mg Oral Q6H PRN Cheryl Stabenow T, MD   650 mg at 08/18/22 0028   alum & mag hydroxide-simeth (MAALOX/MYLANTA) 200-200-20 MG/5ML suspension 30 mL  30 mL Oral Q4H PRN Mayetta Castleman T, MD   30 mL at 04/26/22 [id number]   aspirin EC tablet 81 mg  81 mg Oral Daily Jean Alejos, [name], MD   81 mg at 09/07/22 0912   atorvastatin (LIPITOR) tablet 20 mg  20 mg Oral Daily Tiegan Terpstra T, MD   20 mg at 09/07/22 0912   atropine 1 % ophthalmic solution 1 drop  1 drop Sublingual QID [name], MD   1 drop at 09/07/22 1651   benztropine (COGENTIN) tablet 0.5 mg  0.5 mg Oral BID Kavish Lafitte T, MD   0.5 mg at 09/07/22 1651   clonazePAM (KLONOPIN) tablet 1 mg  1 mg Oral TID PRN He, [name], MD   1 mg at 09/06/22 2318   cloZAPine (CLOZARIL) tablet 300 mg  300 mg Oral QHS Jamira Barfuss T, MD   300 mg at 09/06/22 2128   diphenhydrAMINE (BENADRYL) capsule 50 mg  50 mg Oral Q6H PRN Kaenan Jake, [name], MD   50 mg at 09/01/22 2111   Or   diphenhydrAMINE (BENADRYL) injection 50 mg  50 mg Intramuscular Q6H PRN  Durant Scibilia, [name], MD       feeding supplement (ENSURE ENLIVE / ENSURE PLUS) liquid 237 mL  237 mL Oral TID BM Luka Reisch T, MD   237 mL at 09/07/22 1500   gabapentin (NEURONTIN) capsule 300 mg  300 mg Oral TID Zoriah Pulice T, MD   300 mg at 09/07/22 1651   haloperidol (HALDOL) tablet 2 mg  2 mg Oral TID Katheryn Culliton T, MD   2 mg at 09/07/22 1651   haloperidol (HALDOL) tablet 5 mg  5 mg Oral Q6H PRN Renn Stille T, MD   5 mg at 09/06/22 2319   Or   haloperidol lactate (HALDOL) injection 5 mg  5 mg Intramuscular Q6H PRN Kelsy Polack T, MD       lisinopril (ZESTRIL) tablet 5 mg  5 mg Oral Daily Vonya Ohalloran T, MD   5 mg at 09/07/22 0912   magnesium hydroxide (MILK OF MAGNESIA) suspension 30 mL  30 mL Oral Daily PRN Kaley Jutras T, MD   30 mL at 05/02/22 [id number]   metoprolol succinate (TOPROL-XL) 24 hr tablet 25 mg  25 mg Oral Daily  Shain Pauwels T, MD   25 mg at 09/07/22 0914   ondansetron (ZOFRAN) tablet 4 mg  4 mg Oral Q8H PRN [name], NP   4 mg at 09/06/22 2320   paliperidone (INVEGA SUSTENNA) injection 156 mg  156 mg Intramuscular Q28 days Yolette Hastings, [name], MD   156 mg at 08/16/22 0833   temazepam (RESTORIL) capsule 15 mg  15 mg Oral QHS Merrill Deanda T, MD   15 mg at 09/06/22 2128   ziprasidone (GEODON) injection 20 mg  20 mg Intramuscular Q12H PRN Jasai Sorg, [name], MD   20 mg at 07/08/22 1453    Lab Results: No results found for this or any previous visit (from the past 48 hour(s)).  Blood Alcohol level:  Lab Results  Component Value Date   ETH <10 [id number]    Metabolic Disorder Labs: Lab Results  Component Value Date   HGBA1C 5.5 04/10/2022   MPG 111.15 04/10/2022   No results found for: "PROLACTIN" Lab Results  Component Value Date   CHOL 167 11/20/2021   TRIG 107 11/20/2021   HDL 26 (L) 11/20/2021   CHOLHDL 6.4 11/20/2021   VLDL 21 11/20/2021   LDLCALC 120 (H) 11/20/2021    Physical Findings: AIMS: Facial and Oral Movements Muscles of Facial Expression: None, normal Lips and Perioral Area: None, normal Jaw: None, normal Tongue: None, normal,Extremity Movements Upper (arms, wrists, hands, fingers): None, normal Lower (legs, knees, ankles, toes): None, normal, Trunk Movements Neck, shoulders, hips: None, normal, Overall Severity Severity of abnormal movements (highest score from questions above): None, normal Incapacitation due to abnormal movements: None, normal Patient's awareness of abnormal movements (rate only patient's report): No Awareness, Dental Status Current problems with teeth and/or dentures?: No Does patient usually wear dentures?: No  CIWA:    COWS:     Musculoskeletal: Strength & Muscle Tone: within normal limits Gait & Station: normal Patient leans: N/A  Psychiatric Specialty Exam:  Presentation  General Appearance:  Appropriate for Environment; Casual;  Neat; Well Groomed  Eye Contact: Fair  Speech: Slow  Speech Volume: Decreased  Handedness: Left   Mood and Affect  Mood: Anxious  Affect: Congruent   Thought Process  Thought Processes: Goal Directed  Descriptions of Associations:Circumstantial  Orientation:Partial  Thought Content:Scattered  History of Schizophrenia/Schizoaffective disorder:Yes  Duration of Psychotic Symptoms:Greater than  six months  Hallucinations:No data recorded Ideas of Reference:None  Suicidal Thoughts:No data recorded Homicidal Thoughts:No data recorded  Sensorium  Memory: Remote Good  Judgment: Fair  Insight: Fair   Materials engineer: Fair  Attention Span: Fair  Recall: [place] of Knowledge: Fair  Language: Fair   Psychomotor Activity  Psychomotor Activity:No data recorded  Assets  Assets:No data recorded  Sleep  Sleep:No data recorded   Physical Exam: Physical Exam Vitals and nursing note reviewed.  Constitutional:      Appearance: Normal appearance.  HENT:     Head: Normocephalic and atraumatic.     Mouth/Throat:     Pharynx: Oropharynx is clear.  Eyes:     Pupils: Pupils are equal, round, and reactive to light.  Cardiovascular:     Rate and Rhythm: Normal rate and regular rhythm.  Pulmonary:     Effort: Pulmonary effort is normal.     Breath sounds: Normal breath sounds.  Abdominal:     General: Abdomen is flat.     Palpations: Abdomen is soft.  Musculoskeletal:        General: Normal range of motion.  Skin:    General: Skin is warm and dry.  Neurological:     General: No focal deficit present.     Mental Status: He is alert. Mental status is at baseline.  Psychiatric:        Attention and Perception: He is inattentive.        Speech: He is noncommunicative.        Thought Content: Thought content normal.    Review of Systems  Unable to perform ROS: Language  Neurological: Negative.    Blood pressure  94/77, pulse 96, temperature 98.4 F (36.9 C), temperature source Oral, resp. rate 18, height 5\' 8"  (1.727 m), weight 85.8 kg, SpO2 100 %. Body mass index is 28.76 kg/m.   Treatment Plan Summary: Plan very stable.  No change to treatment plan.  Still looking into placement  Alethia Berthold, MD 09/07/2022, 5:28 PM

## 2022-09-07 NOTE — [facility] Group Notes (Signed)
[facility] Group Notes:  (Nursing/MHT/Case Management/Adjunct)  Date:  09/07/2022  Time:  8:28 PM  Type of Therapy:   Wrap up  Participation Level:  Did Not Attend  Participation Quality:   Making loud noise outside room   Summary of Progress/Problems:  [name] 09/07/2022, 8:28 PM

## 2022-09-07 NOTE — Progress Notes (Signed)
Patient is still asleep, so this writer will administer morning medication once he wakes up. This Probation officer notified MD during progression rounds and is ok with this.

## 2022-09-07 NOTE — Plan of Care (Signed)
Pt denies anxiety/depression at this time. Pt denies SI/HI/AVH or pain at this time. Pt is calm and cooperative. Pt is medication compliant. Pt provided with support and encouragement. Pt monitored q15 minutes for safety per unit policy. Plan of care ongoing.   After medication administration and all other pts were in bed sleeping pt requested a new mask. When given the new mask pt was upset that it did not have the metal piece used to bend over nose. Pt began to yell, shout, and bang on things. This writer went to draw up IM PRNs. By the time this writer drew up the medications the pt calmed down went to his room and went to sleep. Nursing staff attempted verbal de-escalation, educating pt on unit policy, divisional actives, and decreasing stimuli before drawing up IM's.    Problem: Self-Concept: Goal: Level of anxiety will decrease Outcome: Progressing   Problem: Education: Goal: Ability to state activities that reduce stress will improve Outcome: Not Progressing

## 2022-09-08 NOTE — Progress Notes (Addendum)
Pt denies SI/HI/AVH and verbally agrees to approach staff if these become apparent or before harming themselves/others. Rates depression 0/10. Rates anxiety 0/10. Rates pain 0/10. Pt has been very labile today and very needy. Pt has bursts of singing out loud, as normal. Pt pointed at one pt with anger, out of the blue, and was calling him "joker" over and over. Pt followed the other pt to his room but stopped outside of the room and sat in chairs. The other pt ended up shutting the door and Adam [name] did not attempt to do anything else. Scheduled medications administered to pt, per MD orders. RN provided support and encouragement to pt. Q15 min safety checks implemented and continued. Pt safe on the unit. RN will continue to monitor and intervene as needed.   Problem: Coping: Goal: Level of anxiety will decrease Outcome: Progressing   Problem: Education: Goal: Will be free of psychotic symptoms Outcome: Progressing   09/08/22 0935  Psych Admission Type (Psych Patients Only)  Admission Status Involuntary  Psychosocial Assessment  Patient Complaints None  Eye Contact Fair  Facial Expression Animated  Affect Appropriate to circumstance  Speech Aphasic  Interaction Childlike  Motor Activity Other (Comment) (WDL)  Appearance/Hygiene Disheveled  Behavior Characteristics Cooperative;Appropriate to situation;Calm  Mood Pleasant  Thought Process  Coherency Circumstantial  Content Preoccupation  Delusions None reported or observed  Perception WDL  Hallucination None reported or observed  Judgment Impaired  Confusion None  Danger to Self  Current suicidal ideation? Denies  Danger to Others  Danger to Others None reported or observed  Danger to Others Abnormal  Harmful Behavior to others No threats or harm toward other people  Destructive Behavior No threats or harm toward property

## 2022-09-08 NOTE — Plan of Care (Signed)
Patient alert but evasive during assessment.  Patient was very anxious yelling out no no.  Patient did take medications with the assistance of security.  No s/s of depression noted.  Patient does not appear to be responding to stimuli.  Patient does not complain of any pain.  Patient can contract for safety.  Q 15 minute rounds in progress, will continue to monitor.  Problem: Coping: Goal: Ability to identify and develop effective coping behavior will improve Outcome: Not Progressing   Problem: Self-Concept: Goal: Level of anxiety will decrease Outcome: Not Progressing Goal: Ability to modify response to factors that promote anxiety will improve Outcome: Not Progressing

## 2022-09-08 NOTE — Progress Notes (Signed)
[facility] MD Progress Note  09/08/2022 12:06 PM Adam [name]  MRN:  [id number] Subjective: Patient seen and chart reviewed.  No new complaints.  Behavior has been stable.  No evidence of any new problems.  Getting along fine.  Physically seems okay. Principal Problem: Schizophrenia, chronic condition with acute exacerbation (HCC) Diagnosis: Principal Problem:   Schizophrenia, chronic condition with acute exacerbation (HCC)  Total Time spent with patient: 30 minutes  Past Psychiatric History: History of schizophrenia  Past Medical History:  Past Medical History:  Diagnosis Date   Myocardial infarction (HCC)    Schizophrenia (HCC)    Stroke (HCC)    TBI (traumatic brain injury) (HCC)    History reviewed. No pertinent surgical history. Family History: History reviewed. No pertinent family history. Family Psychiatric  History: See previous Social History:  Social History   Substance and Sexual Activity  Alcohol Use Not Currently     Social History   Substance and Sexual Activity  Drug Use Not Currently    Social History   Socioeconomic History   Marital status: Single    Spouse name: Not on file   Number of children: Not on file   Years of education: Not on file   Highest education level: Not on file  Occupational History   Not on file  Tobacco Use   Smoking status: Never    Passive exposure: Never   Smokeless tobacco: Never  Vaping Use   Vaping Use: Unknown  Substance and Sexual Activity   Alcohol use: Not Currently   Drug use: Not Currently   Sexual activity: Not Currently  Other Topics Concern   Not on file  Social History Narrative   Not on file   Social Determinants of Health   Financial Resource Strain: Not on file  Food Insecurity: Not on file  Transportation Needs: Not on file  Physical Activity: Not on file  Stress: Not on file  Social Connections: Not on file   Additional Social History:  Specify valuables returned:  (none)                       Sleep: Fair  Appetite:  Fair  Current Medications: Current Facility-Administered Medications  Medication Dose Route Frequency Provider Last Rate Last Admin   acetaminophen (TYLENOL) tablet 650 mg  650 mg Oral Q6H PRN Kc Sedlak T, MD   650 mg at 08/18/22 0028   alum & mag hydroxide-simeth (MAALOX/MYLANTA) 200-200-20 MG/5ML suspension 30 mL  30 mL Oral Q4H PRN Kellin Bartling T, MD   30 mL at 04/26/22 8299   aspirin EC tablet 81 mg  81 mg Oral Daily Teria Khachatryan, [name], MD   81 mg at 09/08/22 0934   atorvastatin (LIPITOR) tablet 20 mg  20 mg Oral Daily Leigh Blas T, MD   20 mg at 09/08/22 0933   atropine 1 % ophthalmic solution 1 drop  1 drop Sublingual QID [name], MD   1 drop at 09/08/22 0935   benztropine (COGENTIN) tablet 0.5 mg  0.5 mg Oral BID Eliceo Gladu T, MD   0.5 mg at 09/08/22 0934   clonazePAM (KLONOPIN) tablet 1 mg  1 mg Oral TID PRN He, [name], MD   1 mg at 09/06/22 2318   cloZAPine (CLOZARIL) tablet 300 mg  300 mg Oral QHS Britain Saber T, MD   300 mg at 09/07/22 2101   diphenhydrAMINE (BENADRYL) capsule 50 mg  50 mg Oral Q6H PRN Breanna Shorkey, [name], MD  50 mg at 09/01/22 2111   Or   diphenhydrAMINE (BENADRYL) injection 50 mg  50 mg Intramuscular Q6H PRN Everett Ehrler T, MD       feeding supplement (ENSURE ENLIVE / ENSURE PLUS) liquid 237 mL  237 mL Oral TID BM Ashayla Subia T, MD   237 mL at 09/08/22 0935   gabapentin (NEURONTIN) capsule 300 mg  300 mg Oral TID Siaosi Alter, [name], MD   300 mg at 09/08/22 0934   haloperidol (HALDOL) tablet 2 mg  2 mg Oral TID Bayani Renteria, [name], MD   2 mg at 09/08/22 0933   haloperidol (HALDOL) tablet 5 mg  5 mg Oral Q6H PRN Aeliana Spates T, MD   5 mg at 09/06/22 2319   Or   haloperidol lactate (HALDOL) injection 5 mg  5 mg Intramuscular Q6H PRN Jahkeem Kurka T, MD       lisinopril (ZESTRIL) tablet 5 mg  5 mg Oral Daily Shine Scrogham T, MD   5 mg at 09/08/22 0934   magnesium hydroxide (MILK OF MAGNESIA) suspension 30 mL  30 mL Oral Daily PRN  Arriona Prest T, MD   30 mL at 05/02/22 6151   metoprolol succinate (TOPROL-XL) 24 hr tablet 25 mg  25 mg Oral Daily Greydis Stlouis T, MD   25 mg at 09/08/22 0934   ondansetron (ZOFRAN) tablet 4 mg  4 mg Oral Q8H PRN Lerry Liner M, NP   4 mg at 09/06/22 2320   paliperidone (INVEGA SUSTENNA) injection 156 mg  156 mg Intramuscular Q28 days Alicya Bena, [name], MD   156 mg at 08/16/22 0833   temazepam (RESTORIL) capsule 15 mg  15 mg Oral QHS Kingsten Enfield T, MD   15 mg at 09/07/22 2101   ziprasidone (GEODON) injection 20 mg  20 mg Intramuscular Q12H PRN Wren Gallaga, [name], MD   20 mg at 07/08/22 1453    Lab Results: No results found for this or any previous visit (from the past 48 hour(s)).  Blood Alcohol level:  Lab Results  Component Value Date   ETH <10 07/20/2021    Metabolic Disorder Labs: Lab Results  Component Value Date   HGBA1C 5.5 04/10/2022   MPG 111.15 04/10/2022   No results found for: "PROLACTIN" Lab Results  Component Value Date   CHOL 167 11/20/2021   TRIG 107 11/20/2021   HDL 26 (L) 11/20/2021   CHOLHDL 6.4 11/20/2021   VLDL 21 11/20/2021   LDLCALC 120 (H) 11/20/2021    Physical Findings: AIMS: Facial and Oral Movements Muscles of Facial Expression: None, normal Lips and Perioral Area: None, normal Jaw: None, normal Tongue: None, normal,Extremity Movements Upper (arms, wrists, hands, fingers): None, normal Lower (legs, knees, ankles, toes): None, normal, Trunk Movements Neck, shoulders, hips: None, normal, Overall Severity Severity of abnormal movements (highest score from questions above): None, normal Incapacitation due to abnormal movements: None, normal Patient's awareness of abnormal movements (rate only patient's report): No Awareness, Dental Status Current problems with teeth and/or dentures?: No Does patient usually wear dentures?: No  CIWA:    COWS:     Musculoskeletal: Strength & Muscle Tone: within normal limits Gait & Station: normal Patient  leans: N/A  Psychiatric Specialty Exam:  Presentation  General Appearance:  Appropriate for Environment; Casual; Neat; Well Groomed  Eye Contact: Fair  Speech: Slow  Speech Volume: Decreased  Handedness: Left   Mood and Affect  Mood: Anxious  Affect: Congruent   Thought Process  Thought Processes: Goal  Directed  Descriptions of Associations:Circumstantial  Orientation:Partial  Thought Content:Scattered  History of Schizophrenia/Schizoaffective disorder:Yes  Duration of Psychotic Symptoms:Greater than six months  Hallucinations:No data recorded Ideas of Reference:None  Suicidal Thoughts:No data recorded Homicidal Thoughts:No data recorded  Sensorium  Memory: Remote Good  Judgment: Fair  Insight: Fair   Community education officer  Concentration: Fair  Attention Span: Fair  Recall: [employer] of Knowledge: Fair  Language: Fair   Psychomotor Activity  Psychomotor Activity:No data recorded  Assets  Assets:No data recorded  Sleep  Sleep:No data recorded   Physical Exam: Physical Exam Vitals and nursing note reviewed.  Constitutional:      Appearance: Normal appearance.  HENT:     Head: Normocephalic and atraumatic.     Mouth/Throat:     Pharynx: Oropharynx is clear.  Eyes:     Pupils: Pupils are equal, round, and reactive to light.  Cardiovascular:     Rate and Rhythm: Normal rate and regular rhythm.  Pulmonary:     Effort: Pulmonary effort is normal.     Breath sounds: Normal breath sounds.  Abdominal:     General: Abdomen is flat.     Palpations: Abdomen is soft.  Musculoskeletal:        General: Normal range of motion.  Skin:    General: Skin is warm and dry.  Neurological:     General: No focal deficit present.     Mental Status: He is alert. Mental status is at baseline.  Psychiatric:        Attention and Perception: He is inattentive.        Mood and Affect: Mood normal. Affect is blunt.        Speech: He is  noncommunicative.    Review of Systems  Unable to perform ROS: Language   Blood pressure 111/74, pulse 94, temperature 97.9 F (36.6 C), temperature source Oral, resp. rate 18, height 5\' 8"  (1.727 m), weight 85.8 kg, SpO2 96 %. Body mass index is 28.76 kg/m.   Treatment Plan Summary: Plan no change to treatment plan.  Supportive therapy.  Encourage him to be optimistic.  Explained that while I have no specifics for him I know that there are people working on discharge planning  Alethia Berthold, MD 09/08/2022, 12:06 PM

## 2022-09-09 NOTE — Plan of Care (Signed)
D: Patient alert and oriented. Patient denies pain. Patient denies anxiety and depression. Patient denies SI/HI/AVH. Patient was asleep through breakfast but did eat other meals throughout the shift. Patient frequently observed throughout the unit.   A: Scheduled medications administered to patient, per MD orders.  Support and encouragement provided to patient.  Q15 minute safety checks maintained.   R: Patient compliant with medication administration and treatment plan. No adverse drug reactions noted. Patient remains safe on the unit at this time.  Problem: Education: Goal: Knowledge of General Education information will improve Description: Including pain rating scale, medication(s)/side effects and non-pharmacologic comfort measures Outcome: Progressing   Problem: Clinical Measurements: Goal: Ability to maintain clinical measurements within normal limits will improve Outcome: Progressing   Problem: Activity: Goal: Risk for activity intolerance will decrease Outcome: Progressing

## 2022-09-09 NOTE — Progress Notes (Signed)
[facility] MD Progress Note  09/09/2022 11:11 AM Adam [name]  MRN:  [id number] Subjective: Follow-up with this 61 year old man with schizophrenia.  Patient seen and chart reviewed.  No new complaints Principal Problem: Schizophrenia, chronic condition with acute exacerbation (HCC) Diagnosis: Principal Problem:   Schizophrenia, chronic condition with acute exacerbation ([place])  Total Time spent with patient: 30 minutes  Past Psychiatric History: History of schizophrenia.  Lengthy hospitalization waiting placement  Past Medical History:  Past Medical History:  Diagnosis Date   Myocardial infarction ([place])    Schizophrenia ([place])    Stroke ([place])    TBI (traumatic brain injury) ([place])    History reviewed. No pertinent surgical history. Family History: History reviewed. No pertinent family history. Family Psychiatric  History: See previous Social History:  Social History   Substance and Sexual Activity  Alcohol Use Not Currently     Social History   Substance and Sexual Activity  Drug Use Not Currently    Social History   Socioeconomic History   Marital status: Single    Spouse name: Not on file   Number of children: Not on file   Years of education: Not on file   Highest education level: Not on file  Occupational History   Not on file  Tobacco Use   Smoking status: Never    Passive exposure: Never   Smokeless tobacco: Never  Vaping Use   Vaping Use: Unknown  Substance and Sexual Activity   Alcohol use: Not Currently   Drug use: Not Currently   Sexual activity: Not Currently  Other Topics Concern   Not on file  Social History Narrative   Not on file   Social Determinants of Health   Financial Resource Strain: Not on file  Food Insecurity: Not on file  Transportation Needs: Not on file  Physical Activity: Not on file  Stress: Not on file  Social Connections: Not on file   Additional Social History:  Specify valuables returned:  (none)                       Sleep: Fair  Appetite:  Fair  Current Medications: Current Facility-Administered Medications  Medication Dose Route Frequency Provider Last Rate Last Admin   acetaminophen (TYLENOL) tablet 650 mg  650 mg Oral Q6H PRN Krissa Utke T, MD   650 mg at 08/18/22 0028   alum & mag hydroxide-simeth (MAALOX/MYLANTA) 200-200-20 MG/5ML suspension 30 mL  30 mL Oral Q4H PRN Shawna Kiener T, MD   30 mL at 04/26/22 5638   aspirin EC tablet 81 mg  81 mg Oral Daily Dsean Vantol, [name], MD   81 mg at 09/08/22 0934   atorvastatin (LIPITOR) tablet 20 mg  20 mg Oral Daily Clementine Soulliere T, MD   20 mg at 09/08/22 0933   atropine 1 % ophthalmic solution 1 drop  1 drop Sublingual QID [name], MD   1 drop at 09/08/22 2150   benztropine (COGENTIN) tablet 0.5 mg  0.5 mg Oral BID Randi Poullard T, MD   0.5 mg at 09/08/22 1651   clonazePAM (KLONOPIN) tablet 1 mg  1 mg Oral TID PRN He, [name], MD   1 mg at 09/08/22 2147   cloZAPine (CLOZARIL) tablet 300 mg  300 mg Oral QHS Brylei Pedley T, MD   300 mg at 09/08/22 2147   diphenhydrAMINE (BENADRYL) capsule 50 mg  50 mg Oral Q6H PRN Billye Nydam, [name], MD   50 mg at 09/01/22 2111  Or   diphenhydrAMINE (BENADRYL) injection 50 mg  50 mg Intramuscular Q6H PRN Ahlivia Salahuddin T, MD       feeding supplement (ENSURE ENLIVE / ENSURE PLUS) liquid 237 mL  237 mL Oral TID BM Shakur Lembo T, MD   237 mL at 09/08/22 2150   gabapentin (NEURONTIN) capsule 300 mg  300 mg Oral TID Jkai Arwood T, MD   300 mg at 09/08/22 2150   haloperidol (HALDOL) tablet 2 mg  2 mg Oral TID Takari Lundahl, [name], MD   2 mg at 09/08/22 2149   haloperidol (HALDOL) tablet 5 mg  5 mg Oral Q6H PRN Jamesina Gaugh T, MD   5 mg at 09/06/22 2319   Or   haloperidol lactate (HALDOL) injection 5 mg  5 mg Intramuscular Q6H PRN Samari Gorby T, MD       lisinopril (ZESTRIL) tablet 5 mg  5 mg Oral Daily Jaloni Sorber T, MD   5 mg at 09/08/22 0934   magnesium hydroxide (MILK OF MAGNESIA) suspension 30 mL  30 mL Oral Daily PRN  Leoma Folds T, MD   30 mL at 05/02/22 0627   metoprolol succinate (TOPROL-XL) 24 hr tablet 25 mg  25 mg Oral Daily Hansford Hirt T, MD   25 mg at 09/08/22 0934   ondansetron (ZOFRAN) tablet 4 mg  4 mg Oral Q8H PRN Lerry Liner M, NP   4 mg at 09/06/22 2320   paliperidone (INVEGA SUSTENNA) injection 156 mg  156 mg Intramuscular Q28 days Anira Senegal, [name], MD   156 mg at 08/16/22 0833   temazepam (RESTORIL) capsule 15 mg  15 mg Oral QHS Keneth Borg T, MD   15 mg at 09/08/22 2147   ziprasidone (GEODON) injection 20 mg  20 mg Intramuscular Q12H PRN Amylia Collazos, [name], MD   20 mg at 07/08/22 1453    Lab Results: No results found for this or any previous visit (from the past 48 hour(s)).  Blood Alcohol level:  Lab Results  Component Value Date   ETH <10 07/20/2021    Metabolic Disorder Labs: Lab Results  Component Value Date   HGBA1C 5.5 04/10/2022   MPG 111.15 04/10/2022   No results found for: "PROLACTIN" Lab Results  Component Value Date   CHOL 167 11/20/2021   TRIG 107 11/20/2021   HDL 26 (L) 11/20/2021   CHOLHDL 6.4 11/20/2021   VLDL 21 11/20/2021   LDLCALC 120 (H) 11/20/2021    Physical Findings: AIMS: Facial and Oral Movements Muscles of Facial Expression: None, normal Lips and Perioral Area: None, normal Jaw: None, normal Tongue: None, normal,Extremity Movements Upper (arms, wrists, hands, fingers): None, normal Lower (legs, knees, ankles, toes): None, normal, Trunk Movements Neck, shoulders, hips: None, normal, Overall Severity Severity of abnormal movements (highest score from questions above): None, normal Incapacitation due to abnormal movements: None, normal Patient's awareness of abnormal movements (rate only patient's report): No Awareness, Dental Status Current problems with teeth and/or dentures?: No Does patient usually wear dentures?: No  CIWA:    COWS:     Musculoskeletal: Strength & Muscle Tone: within normal limits Gait & Station: normal Patient  leans: N/A  Psychiatric Specialty Exam:  Presentation  General Appearance:  Appropriate for Environment; Casual; Neat; Well Groomed  Eye Contact: Fair  Speech: Slow  Speech Volume: Decreased  Handedness: Left   Mood and Affect  Mood: Anxious  Affect: Congruent   Thought Process  Thought Processes: Goal Directed  Descriptions of Associations:Circumstantial  Orientation:Partial  Thought Content:Scattered  History of Schizophrenia/Schizoaffective disorder:Yes  Duration of Psychotic Symptoms:Greater than six months  Hallucinations:No data recorded Ideas of Reference:None  Suicidal Thoughts:No data recorded Homicidal Thoughts:No data recorded  Sensorium  Memory: Remote Good  Judgment: Fair  Insight: Fair   Chartered certified accountant: Fair  Attention Span: Fair  Recall: [employer] of Knowledge: Fair  Language: Fair   Psychomotor Activity  Psychomotor Activity:No data recorded  Assets  Assets:No data recorded  Sleep  Sleep:No data recorded   Physical Exam: Physical Exam Vitals and nursing note reviewed.  Constitutional:      Appearance: Normal appearance.  HENT:     Head: Normocephalic and atraumatic.     Mouth/Throat:     Pharynx: Oropharynx is clear.  Eyes:     Pupils: Pupils are equal, round, and reactive to light.  Cardiovascular:     Rate and Rhythm: Normal rate and regular rhythm.  Pulmonary:     Effort: Pulmonary effort is normal.     Breath sounds: Normal breath sounds.  Abdominal:     General: Abdomen is flat.     Palpations: Abdomen is soft.  Musculoskeletal:        General: Normal range of motion.  Skin:    General: Skin is warm and dry.  Neurological:     General: No focal deficit present.     Mental Status: He is alert. Mental status is at baseline.  Psychiatric:        Attention and Perception: He is inattentive.        Speech: He is noncommunicative.        Thought Content: Thought  content normal.    Review of Systems  Unable to perform ROS: Language   Blood pressure (!) 144/129, pulse 88, temperature 98.3 F (36.8 C), temperature source Oral, resp. rate 18, height 5\' 8"  (1.727 m), weight 85.8 kg, SpO2 100 %. Body mass index is 28.76 kg/m.   Treatment Plan Summary: Plan patient is absolutely stable no change to treatment plan or behavior.  Supportive counseling and reminder of the patient that we are still working on discharge.  , MD 09/09/2022, 11:11 AM

## 2022-09-09 NOTE — [facility] Group Notes (Signed)
[facility] Group Notes:  (Nursing/MHT/Case Management/Adjunct)  Date:  09/09/2022  Time:  8:27 PM  Type of Therapy:   Wrap up   Participation Level:  Minimal  Participation Quality:  Appropriate  Affect:  Appropriate  Cognitive:  Alert  Insight:  Good  Engagement in Group:  Engaged and words not come together.  Modes of Intervention:  Support  Summary of Progress/Problems:  Adam [name] 09/09/2022, 8:27 PM

## 2022-09-10 NOTE — Plan of Care (Signed)
D: Patient alert and oriented. Patient denies pain. Patient denies anxiety and depression. Patient denies SI/HI/AVH. Patient has been frequently observed walking around the unit interacting with staff and peers. Patient has been walking around the unit repeating "[place]."  A: Scheduled medications administered to patient, per MD orders.  Support and encouragement provided to patient.  Q15 minute safety checks maintained.   R: Patient compliant with medication administration and treatment plan. No adverse drug reactions noted. Patient remains safe on the unit at this time.  Problem: Education: Goal: Ability to state activities that reduce stress will improve Outcome: Progressing   Problem: Self-Concept: Goal: Ability to modify response to factors that promote anxiety will improve Outcome: Progressing   Problem: Education: Goal: Knowledge of General Education information will improve Description: Including pain rating scale, medication(s)/side effects and non-pharmacologic comfort measures Outcome: Progressing   Problem: Nutrition: Goal: Adequate nutrition will be maintained Outcome: Progressing

## 2022-09-10 NOTE — Progress Notes (Signed)
Recreation Therapy Notes  Date: 09/10/2022  Time: 10:50 am   Location: Craft room       Behavioral response: N/A   Intervention Topic: Time Management   Discussion/Intervention: Patient refused to attend group.   Clinical Observations/Feedback:  Patient refused to attend group.    [name] LRT/CTRS        Adam [name] 09/10/2022 11:46 AM 

## 2022-09-10 NOTE — Progress Notes (Signed)
Patient calm and pleasant during assessment denying SI/HI/AVH. Pt observed interacting appropriately with staff and peers on the unit. Pt compliant with medication administration per MD orders. Pt given education, support, and encouragement to be active in his treatment plan. Pt being monitored Q 15 minutes for safety per unit protocol, remains safe on the unit  

## 2022-09-10 NOTE — Progress Notes (Signed)
[facility] MD Progress Note  09/10/2022 3:19 PM Adam [name]  MRN:  [id number] Subjective: Follow-up 61 year old man with schizophrenia.  No change in presentation.  Completely stable. Principal Problem: Schizophrenia, chronic condition with acute exacerbation (HCC) Diagnosis: Principal Problem:   Schizophrenia, chronic condition with acute exacerbation (HCC)  Total Time spent with patient: 30 minutes  Past Psychiatric History: Past history of schizophrenia  Past Medical History:  Past Medical History:  Diagnosis Date   Myocardial infarction (HCC)    Schizophrenia (HCC)    Stroke (HCC)    TBI (traumatic brain injury) (HCC)    History reviewed. No pertinent surgical history. Family History: History reviewed. No pertinent family history. Family Psychiatric  History: Completely stable Social History:  Social History   Substance and Sexual Activity  Alcohol Use Not Currently     Social History   Substance and Sexual Activity  Drug Use Not Currently    Social History   Socioeconomic History   Marital status: Single    Spouse name: Not on file   Number of children: Not on file   Years of education: Not on file   Highest education level: Not on file  Occupational History   Not on file  Tobacco Use   Smoking status: Never    Passive exposure: Never   Smokeless tobacco: Never  Vaping Use   Vaping Use: Unknown  Substance and Sexual Activity   Alcohol use: Not Currently   Drug use: Not Currently   Sexual activity: Not Currently  Other Topics Concern   Not on file  Social History Narrative   Not on file   Social Determinants of Health   Financial Resource Strain: Not on file  Food Insecurity: Not on file  Transportation Needs: Not on file  Physical Activity: Not on file  Stress: Not on file  Social Connections: Not on file   Additional Social History:  Specify valuables returned:  (none)                      Sleep: Fair  Appetite:  Fair  Current  Medications: Current Facility-Administered Medications  Medication Dose Route Frequency Provider Last Rate Last Admin   acetaminophen (TYLENOL) tablet 650 mg  650 mg Oral Q6H PRN Rustin Erhart T, MD   650 mg at 09/09/22 1813   alum & mag hydroxide-simeth (MAALOX/MYLANTA) 200-200-20 MG/5ML suspension 30 mL  30 mL Oral Q4H PRN Engelbert Sevin T, MD   30 mL at 04/26/22 6213   aspirin EC tablet 81 mg  81 mg Oral Daily Azaya Goedde, [name], MD   81 mg at 09/10/22 0909   atorvastatin (LIPITOR) tablet 20 mg  20 mg Oral Daily Stormy Sabol T, MD   20 mg at 09/10/22 0909   atropine 1 % ophthalmic solution 1 drop  1 drop Sublingual QID [name], MD   1 drop at 09/10/22 1326   benztropine (COGENTIN) tablet 0.5 mg  0.5 mg Oral BID Daysie Helf T, MD   0.5 mg at 09/10/22 0909   clonazePAM (KLONOPIN) tablet 1 mg  1 mg Oral TID PRN He, [name], MD   1 mg at 09/08/22 2147   cloZAPine (CLOZARIL) tablet 300 mg  300 mg Oral QHS Maleek Craver T, MD   300 mg at 09/09/22 2138   diphenhydrAMINE (BENADRYL) capsule 50 mg  50 mg Oral Q6H PRN Kaytlynne Neace, [name], MD   50 mg at 09/01/22 2111   Or   diphenhydrAMINE (BENADRYL) injection 50  mg  50 mg Intramuscular Q6H PRN Dannie Woolen T, MD       feeding supplement (ENSURE ENLIVE / ENSURE PLUS) liquid 237 mL  237 mL Oral TID BM Geniene List T, MD   237 mL at 09/10/22 1327   gabapentin (NEURONTIN) capsule 300 mg  300 mg Oral TID Jem Castro T, MD   300 mg at 09/10/22 1326   haloperidol (HALDOL) tablet 2 mg  2 mg Oral TID Kynsleigh Westendorf T, MD   2 mg at 09/10/22 1326   haloperidol (HALDOL) tablet 5 mg  5 mg Oral Q6H PRN Ayvion Kavanagh T, MD   5 mg at 09/06/22 2319   Or   haloperidol lactate (HALDOL) injection 5 mg  5 mg Intramuscular Q6H PRN Sammie Schermerhorn T, MD       lisinopril (ZESTRIL) tablet 5 mg  5 mg Oral Daily Jalisha Enneking T, MD   5 mg at 09/10/22 0908   magnesium hydroxide (MILK OF MAGNESIA) suspension 30 mL  30 mL Oral Daily PRN Sabiha Sura T, MD   30 mL at 05/02/22 8250    metoprolol succinate (TOPROL-XL) 24 hr tablet 25 mg  25 mg Oral Daily Skilynn Durney T, MD   25 mg at 09/10/22 0909   ondansetron (ZOFRAN) tablet 4 mg  4 mg Oral Q8H PRN Lerry Liner M, NP   4 mg at 09/06/22 2320   paliperidone (INVEGA SUSTENNA) injection 156 mg  156 mg Intramuscular Q28 days Malaiya Paczkowski, [name], MD   156 mg at 08/16/22 0833   temazepam (RESTORIL) capsule 15 mg  15 mg Oral QHS Yobani Schertzer T, MD   15 mg at 09/09/22 2200   ziprasidone (GEODON) injection 20 mg  20 mg Intramuscular Q12H PRN Curties Conigliaro, [name], MD   20 mg at 07/08/22 1453    Lab Results: No results found for this or any previous visit (from the past 48 hour(s)).  Blood Alcohol level:  Lab Results  Component Value Date   ETH <10 07/20/2021    Metabolic Disorder Labs: Lab Results  Component Value Date   HGBA1C 5.5 04/10/2022   MPG 111.15 04/10/2022   No results found for: "PROLACTIN" Lab Results  Component Value Date   CHOL 167 11/20/2021   TRIG 107 11/20/2021   HDL 26 (L) 11/20/2021   CHOLHDL 6.4 11/20/2021   VLDL 21 11/20/2021   LDLCALC 120 (H) 11/20/2021    Physical Findings: AIMS: Facial and Oral Movements Muscles of Facial Expression: None, normal Lips and Perioral Area: None, normal Jaw: None, normal Tongue: None, normal,Extremity Movements Upper (arms, wrists, hands, fingers): None, normal Lower (legs, knees, ankles, toes): None, normal, Trunk Movements Neck, shoulders, hips: None, normal, Overall Severity Severity of abnormal movements (highest score from questions above): None, normal Incapacitation due to abnormal movements: None, normal Patient's awareness of abnormal movements (rate only patient's report): No Awareness, Dental Status Current problems with teeth and/or dentures?: No Does patient usually wear dentures?: No  CIWA:    COWS:     Musculoskeletal: Strength & Muscle Tone: within normal limits Gait & Station: normal Patient leans: N/A  Psychiatric Specialty  Exam:  Presentation  General Appearance:  Appropriate for Environment; Casual; Neat; Well Groomed  Eye Contact: Fair  Speech: Slow  Speech Volume: Decreased  Handedness: Left   Mood and Affect  Mood: Anxious  Affect: Congruent   Thought Process  Thought Processes: Goal Directed  Descriptions of Associations:Circumstantial  Orientation:Partial  Thought Content:Scattered  History of Schizophrenia/Schizoaffective  disorder:Yes  Duration of Psychotic Symptoms:Greater than six months  Hallucinations:No data recorded Ideas of Reference:None  Suicidal Thoughts:No data recorded Homicidal Thoughts:No data recorded  Sensorium  Memory: Remote Good  Judgment: Fair  Insight: Fair   Materials engineer: Fair  Attention Span: Fair  Recall: [place] of Knowledge: Fair  Language: Fair   Psychomotor Activity  Psychomotor Activity:No data recorded  Assets  Assets:No data recorded  Sleep  Sleep:No data recorded   Physical Exam: Physical Exam Vitals reviewed.  Constitutional:      Appearance: Normal appearance.  HENT:     Head: Normocephalic and atraumatic.     Mouth/Throat:     Pharynx: Oropharynx is clear.  Eyes:     Pupils: Pupils are equal, round, and reactive to light.  Cardiovascular:     Rate and Rhythm: Normal rate and regular rhythm.  Pulmonary:     Effort: Pulmonary effort is normal.     Breath sounds: Normal breath sounds.  Abdominal:     General: Abdomen is flat.     Palpations: Abdomen is soft.  Musculoskeletal:        General: Normal range of motion.  Skin:    General: Skin is warm and dry.  Neurological:     General: No focal deficit present.     Mental Status: He is alert. Mental status is at baseline.  Psychiatric:        Mood and Affect: Mood normal.        Thought Content: Thought content normal.    Review of Systems  Constitutional: Negative.   HENT: Negative.    Eyes: Negative.    Respiratory: Negative.    Cardiovascular: Negative.   Gastrointestinal: Negative.   Musculoskeletal: Negative.   Skin: Negative.   Neurological: Negative.   Psychiatric/Behavioral: Negative.     Blood pressure (!) 129/101, pulse (!) 103, temperature 98.3 F (36.8 C), temperature source Oral, resp. rate 18, height 5\' 8"  (1.727 m), weight 85.8 kg, SpO2 100 %. Body mass index is 28.76 kg/m.   Treatment Plan Summary: Plan presentation remains exactly the same.  No problems expressed.  Completely stable.  No change to medicine.  Still waiting for placement  Alethia Berthold, MD 09/10/2022, 3:19 PM

## 2022-09-11 NOTE — Progress Notes (Signed)
[facility] MD Progress Note  09/11/2022 3:21 PM Adam [name]  MRN:  [id number] Subjective: No new complaint.  Mood has been stable no change to behavior.  Completely stable Principal Problem: Schizophrenia, chronic condition with acute exacerbation (HCC) Diagnosis: Principal Problem:   Schizophrenia, chronic condition with acute exacerbation ([place])  Total Time spent with patient: 15 minutes  Past Psychiatric History: Schizophrenia  Past Medical History:  Past Medical History:  Diagnosis Date   Myocardial infarction ([place])    Schizophrenia ([place])    Stroke ([place])    TBI (traumatic brain injury) ([place])    History reviewed. No pertinent surgical history. Family History: History reviewed. No pertinent family history. Family Psychiatric  History: See previous Social History:  Social History   Substance and Sexual Activity  Alcohol Use Not Currently     Social History   Substance and Sexual Activity  Drug Use Not Currently    Social History   Socioeconomic History   Marital status: Single    Spouse name: Not on file   Number of children: Not on file   Years of education: Not on file   Highest education level: Not on file  Occupational History   Not on file  Tobacco Use   Smoking status: Never    Passive exposure: Never   Smokeless tobacco: Never  Vaping Use   Vaping Use: Unknown  Substance and Sexual Activity   Alcohol use: Not Currently   Drug use: Not Currently   Sexual activity: Not Currently  Other Topics Concern   Not on file  Social History Narrative   Not on file   Social Determinants of Health   Financial Resource Strain: Not on file  Food Insecurity: Not on file  Transportation Needs: Not on file  Physical Activity: Not on file  Stress: Not on file  Social Connections: Not on file   Additional Social History:  Specify valuables returned:  (none)                      Sleep: Fair  Appetite:  Fair  Current Medications: Current  Facility-Administered Medications  Medication Dose Route Frequency Provider Last Rate Last Admin   acetaminophen (TYLENOL) tablet 650 mg  650 mg Oral Q6H PRN Sarann Tregre T, MD   650 mg at 09/09/22 1813   alum & mag hydroxide-simeth (MAALOX/MYLANTA) 200-200-20 MG/5ML suspension 30 mL  30 mL Oral Q4H PRN Mckensie Scotti T, MD   30 mL at 04/26/22 8280   aspirin EC tablet 81 mg  81 mg Oral Daily Kenya Kook T, MD   81 mg at 09/11/22 0839   atorvastatin (LIPITOR) tablet 20 mg  20 mg Oral Daily Paizley Ramella T, MD   20 mg at 09/11/22 0839   atropine 1 % ophthalmic solution 1 drop  1 drop Sublingual QID [name], MD   1 drop at 09/11/22 1136   benztropine (COGENTIN) tablet 0.5 mg  0.5 mg Oral BID Alayja Armas T, MD   0.5 mg at 09/11/22 0840   clonazePAM (KLONOPIN) tablet 1 mg  1 mg Oral TID PRN He, [name], MD   1 mg at 09/08/22 2147   cloZAPine (CLOZARIL) tablet 300 mg  300 mg Oral QHS Analya Louissaint T, MD   300 mg at 09/10/22 2051   diphenhydrAMINE (BENADRYL) capsule 50 mg  50 mg Oral Q6H PRN Jansen Sciuto, [name], MD   50 mg at 09/01/22 2111   Or   diphenhydrAMINE (BENADRYL) injection 50 mg  50 mg Intramuscular Q6H PRN Lennox Dolberry T, MD       feeding supplement (ENSURE ENLIVE / ENSURE PLUS) liquid 237 mL  237 mL Oral TID BM Jonae Renshaw T, MD   237 mL at 09/11/22 1427   gabapentin (NEURONTIN) capsule 300 mg  300 mg Oral TID Cristel Rail T, MD   300 mg at 09/11/22 1135   haloperidol (HALDOL) tablet 2 mg  2 mg Oral TID Franshesca Chipman T, MD   2 mg at 09/11/22 1134   haloperidol (HALDOL) tablet 5 mg  5 mg Oral Q6H PRN Myreon Wimer T, MD   5 mg at 09/06/22 2319   Or   haloperidol lactate (HALDOL) injection 5 mg  5 mg Intramuscular Q6H PRN Diamonique Ruedas T, MD       lisinopril (ZESTRIL) tablet 5 mg  5 mg Oral Daily Marija Calamari T, MD   5 mg at 09/11/22 0839   magnesium hydroxide (MILK OF MAGNESIA) suspension 30 mL  30 mL Oral Daily PRN Astryd Pearcy T, MD   30 mL at 05/02/22 1610   metoprolol succinate  (TOPROL-XL) 24 hr tablet 25 mg  25 mg Oral Daily Allyssia Skluzacek T, MD   25 mg at 09/11/22 0839   ondansetron (ZOFRAN) tablet 4 mg  4 mg Oral Q8H PRN Lerry Liner M, NP   4 mg at 09/06/22 2320   paliperidone (INVEGA SUSTENNA) injection 156 mg  156 mg Intramuscular Q28 days Alayna Mabe, [name], MD   156 mg at 08/16/22 0833   temazepam (RESTORIL) capsule 15 mg  15 mg Oral QHS Salaya Holtrop T, MD   15 mg at 09/10/22 2051   ziprasidone (GEODON) injection 20 mg  20 mg Intramuscular Q12H PRN Napolean Sia, [name], MD   20 mg at 07/08/22 1453    Lab Results: No results found for this or any previous visit (from the past 48 hour(s)).  Blood Alcohol level:  Lab Results  Component Value Date   ETH <10 07/20/2021    Metabolic Disorder Labs: Lab Results  Component Value Date   HGBA1C 5.5 04/10/2022   MPG 111.15 04/10/2022   No results found for: "PROLACTIN" Lab Results  Component Value Date   CHOL 167 11/20/2021   TRIG 107 11/20/2021   HDL 26 (L) 11/20/2021   CHOLHDL 6.4 11/20/2021   VLDL 21 11/20/2021   LDLCALC 120 (H) 11/20/2021    Physical Findings: AIMS: Facial and Oral Movements Muscles of Facial Expression: None, normal Lips and Perioral Area: None, normal Jaw: None, normal Tongue: None, normal,Extremity Movements Upper (arms, wrists, hands, fingers): None, normal Lower (legs, knees, ankles, toes): None, normal, Trunk Movements Neck, shoulders, hips: None, normal, Overall Severity Severity of abnormal movements (highest score from questions above): None, normal Incapacitation due to abnormal movements: None, normal Patient's awareness of abnormal movements (rate only patient's report): No Awareness, Dental Status Current problems with teeth and/or dentures?: No Does patient usually wear dentures?: No  CIWA:    COWS:     Musculoskeletal: Strength & Muscle Tone: within normal limits Gait & Station: normal Patient leans: N/A  Psychiatric Specialty Exam:  Presentation  General  Appearance:  Appropriate for Environment; Casual; Neat; Well Groomed  Eye Contact: Fair  Speech: Slow  Speech Volume: Decreased  Handedness: Left   Mood and Affect  Mood: Anxious  Affect: Congruent   Thought Process  Thought Processes: Goal Directed  Descriptions of Associations:Circumstantial  Orientation:Partial  Thought Content:Scattered  History of Schizophrenia/Schizoaffective disorder:Yes  Duration of Psychotic Symptoms:Greater than six months  Hallucinations:No data recorded Ideas of Reference:None  Suicidal Thoughts:No data recorded Homicidal Thoughts:No data recorded  Sensorium  Memory: Remote Good  Judgment: Fair  Insight: Fair   Chartered certified accountant: Fair  Attention Span: Fair  Recall: Fair  Fund of Knowledge: Fair  Language: Fair   Psychomotor Activity  Psychomotor Activity:No data recorded  Assets  Assets:No data recorded  Sleep  Sleep:No data recorded   Physical Exam: Physical Exam Vitals reviewed.  Constitutional:      Appearance: Normal appearance.  HENT:     Head: Normocephalic and atraumatic.     Mouth/Throat:     Pharynx: Oropharynx is clear.  Eyes:     Pupils: Pupils are equal, round, and reactive to light.  Cardiovascular:     Rate and Rhythm: Normal rate and regular rhythm.  Pulmonary:     Effort: Pulmonary effort is normal.     Breath sounds: Normal breath sounds.  Abdominal:     General: Abdomen is flat.     Palpations: Abdomen is soft.  Musculoskeletal:        General: Normal range of motion.  Skin:    General: Skin is warm and dry.  Neurological:     General: No focal deficit present.     Mental Status: He is alert. Mental status is at baseline.  Psychiatric:        Attention and Perception: He is inattentive.        Mood and Affect: Mood normal. Affect is blunt.        Speech: He is noncommunicative.    Review of Systems  Unable to perform ROS: Language   Neurological: Negative.    Blood pressure (!) 129/101, pulse (!) 103, temperature 98.3 F (36.8 C), temperature source Oral, resp. rate 18, height 5\' 8"  (1.727 m), weight 85.8 kg, SpO2 100 %. Body mass index is 28.76 kg/m.   Treatment Plan Summary: Plan completely stable.  No change indicated.  Encourage group attendance.  Still entirely waiting on placement  , MD 09/11/2022, 3:21 PM

## 2022-09-11 NOTE — Plan of Care (Signed)
  Problem: Self-Concept: Goal: Level of anxiety will decrease Outcome: Progressing   Problem: Self-Concept: Goal: Ability to modify response to factors that promote anxiety will improve Outcome: Progressing   Problem: Nutrition: Goal: Adequate nutrition will be maintained Outcome: Progressing   Problem: Activity: Goal: Interest or engagement in leisure activities will improve Outcome: Progressing   Problem: Coping: Goal: Coping ability will improve Outcome: Progressing

## 2022-09-11 NOTE — Progress Notes (Signed)
Pt denies SI/HI/AVH and verbally agrees to approach staff if these become apparent or before harming themselves/others. Rates depression 0/10. Rates anxiety 0/10. Rates pain 0/10.  Scheduled medications administered to pt, per MD orders. RN provided support and encouragement to pt. Q15 min safety checks implemented and continued. Pt safe on the unit. RN will continue to monitor and intervene as needed.  Problem: Clinical Measurements: Goal: Diagnostic test results will improve Outcome: Progressing   Problem: Nutrition: Goal: Adequate nutrition will be maintained Outcome: Progressing   09/11/22 0839  Psych Admission Type (Psych Patients Only)  Admission Status Involuntary  Psychosocial Assessment  Patient Complaints None  Eye Contact Fair  Facial Expression Anxious  Affect Appropriate to circumstance  Speech Aphasic  Interaction Assertive  Motor Activity Slow  Appearance/Hygiene Unremarkable  Behavior Characteristics Anxious;Cooperative;Appropriate to situation  Mood Anxious  Thought Process  Coherency Blocking  Content Preoccupation  Delusions None reported or observed  Perception WDL  Hallucination None reported or observed  Judgment Impaired  Confusion None  Danger to Self  Current suicidal ideation? Denies  Danger to Others  Danger to Others None reported or observed  Danger to Others Abnormal  Harmful Behavior to others No threats or harm toward other people  Destructive Behavior No threats or harm toward property

## 2022-09-11 NOTE — Progress Notes (Signed)
Recreation Therapy Notes    Date: 09/11/2022  Time: 10:40 am   Location: Craft room       Behavioral response: N/A   Intervention Topic: Goals   Discussion/Intervention: Patient refused to attend group.   Clinical Observations/Feedback:  Patient refused to attend group.    Faron Tudisco LRT/CTRS        Shadrach Bartunek 09/11/2022 12:39 PM

## 2022-09-12 NOTE — [facility] Counselor (Signed)
CSW received call from Unit Manager [place] asking questions regarding the patient's funding.  She reports that she attended a meeting with Loetta Rough, from [employer], who had questions on patient's income, if he has a payee and if he is receiving funds how much does he have.   CSW explained that this CSW team is unaware of this information, however, could reach out to the patient's mother.   CSW contacted patient's mother who reports that she is the patient's payee, however, she does not know the amount in patient's bank account or what patient receives monthly.  She requests that CSW call back in the morning, 09/13/2022 to discuss further so that she will not be "stressed" today as she has prior engagements.  She reports that she will have to also take into account "his burial expenses".  CSW to follow up.   Assunta Curtis, MSW, LCSW 09/12/2022 10:56 AM

## 2022-09-12 NOTE — Progress Notes (Signed)
[facility] MD Progress Note  09/12/2022 2:19 PM Adam [name]  MRN:  [id number] Subjective: Follow-up 61 year old man with schizophrenia.  No change to behavior.  No new complaints.  No evident issues at all. Principal Problem: Schizophrenia, chronic condition with acute exacerbation (HCC) Diagnosis: Principal Problem:   Schizophrenia, chronic condition with acute exacerbation (HCC)  Total Time spent with patient: 30 minutes  Past Psychiatric History: Past history of schizophrenia  Past Medical History:  Past Medical History:  Diagnosis Date   Myocardial infarction (HCC)    Schizophrenia (HCC)    Stroke (HCC)    TBI (traumatic brain injury) (HCC)    History reviewed. No pertinent surgical history. Family History: History reviewed. No pertinent family history. Family Psychiatric  History: See previous Social History:  Social History   Substance and Sexual Activity  Alcohol Use Not Currently     Social History   Substance and Sexual Activity  Drug Use Not Currently    Social History   Socioeconomic History   Marital status: Single    Spouse name: Not on file   Number of children: Not on file   Years of education: Not on file   Highest education level: Not on file  Occupational History   Not on file  Tobacco Use   Smoking status: Never    Passive exposure: Never   Smokeless tobacco: Never  Vaping Use   Vaping Use: Unknown  Substance and Sexual Activity   Alcohol use: Not Currently   Drug use: Not Currently   Sexual activity: Not Currently  Other Topics Concern   Not on file  Social History Narrative   Not on file   Social Determinants of Health   Financial Resource Strain: Not on file  Food Insecurity: Not on file  Transportation Needs: Not on file  Physical Activity: Not on file  Stress: Not on file  Social Connections: Not on file   Additional Social History:  Specify valuables returned:  (none)                      Sleep: Fair  Appetite:   Fair  Current Medications: Current Facility-Administered Medications  Medication Dose Route Frequency Provider Last Rate Last Admin   acetaminophen (TYLENOL) tablet 650 mg  650 mg Oral Q6H PRN Khyler Urda T, MD   650 mg at 09/09/22 1813   alum & mag hydroxide-simeth (MAALOX/MYLANTA) 200-200-20 MG/5ML suspension 30 mL  30 mL Oral Q4H PRN Jihad Brownlow T, MD   30 mL at 04/26/22 2992   aspirin EC tablet 81 mg  81 mg Oral Daily Paulett Kaufhold, [name], MD   81 mg at 09/12/22 0817   atorvastatin (LIPITOR) tablet 20 mg  20 mg Oral Daily Reise Hietala T, MD   20 mg at 09/12/22 0817   atropine 1 % ophthalmic solution 1 drop  1 drop Sublingual QID [name], MD   1 drop at 09/12/22 1136   benztropine (COGENTIN) tablet 0.5 mg  0.5 mg Oral BID Vasilisa Vore T, MD   0.5 mg at 09/12/22 0817   clonazePAM (KLONOPIN) tablet 1 mg  1 mg Oral TID PRN He, [name], MD   1 mg at 09/08/22 2147   cloZAPine (CLOZARIL) tablet 300 mg  300 mg Oral QHS Shantil Vallejo T, MD   300 mg at 09/11/22 2101   diphenhydrAMINE (BENADRYL) capsule 50 mg  50 mg Oral Q6H PRN Zyaira Vejar, [name], MD   50 mg at 09/01/22 2111  Or   diphenhydrAMINE (BENADRYL) injection 50 mg  50 mg Intramuscular Q6H PRN Onnika Siebel T, MD       feeding supplement (ENSURE ENLIVE / ENSURE PLUS) liquid 237 mL  237 mL Oral TID BM Icess Bertoni T, MD   237 mL at 09/12/22 1417   gabapentin (NEURONTIN) capsule 300 mg  300 mg Oral TID Merril Isakson T, MD   300 mg at 09/12/22 1135   haloperidol (HALDOL) tablet 2 mg  2 mg Oral TID Keyerra Lamere T, MD   2 mg at 09/12/22 1135   haloperidol (HALDOL) tablet 5 mg  5 mg Oral Q6H PRN Leisha Trinkle T, MD   5 mg at 09/06/22 2319   Or   haloperidol lactate (HALDOL) injection 5 mg  5 mg Intramuscular Q6H PRN Gatsby Chismar T, MD       lisinopril (ZESTRIL) tablet 5 mg  5 mg Oral Daily Astin Rape T, MD   5 mg at 09/12/22 0817   magnesium hydroxide (MILK OF MAGNESIA) suspension 30 mL  30 mL Oral Daily PRN Grayland Daisey T, MD   30 mL at  05/02/22 6010   metoprolol succinate (TOPROL-XL) 24 hr tablet 25 mg  25 mg Oral Daily Brek Reece T, MD   25 mg at 09/12/22 0817   ondansetron (ZOFRAN) tablet 4 mg  4 mg Oral Q8H PRN [name], NP   4 mg at 09/06/22 2320   paliperidone (INVEGA SUSTENNA) injection 156 mg  156 mg Intramuscular Q28 days Necola Bluestein, [name], MD   156 mg at 08/16/22 0833   temazepam (RESTORIL) capsule 15 mg  15 mg Oral QHS Gregg Holster T, MD   15 mg at 09/11/22 2101   ziprasidone (GEODON) injection 20 mg  20 mg Intramuscular Q12H PRN Gracin Soohoo, [name], MD   20 mg at 07/08/22 1453    Lab Results: No results found for this or any previous visit (from the past 48 hour(s)).  Blood Alcohol level:  Lab Results  Component Value Date   ETH <10 93/23/5573    Metabolic Disorder Labs: Lab Results  Component Value Date   HGBA1C 5.5 04/10/2022   MPG 111.15 04/10/2022   No results found for: "PROLACTIN" Lab Results  Component Value Date   CHOL 167 11/20/2021   TRIG 107 11/20/2021   HDL 26 (L) 11/20/2021   CHOLHDL 6.4 11/20/2021   VLDL 21 11/20/2021   LDLCALC 120 (H) 11/20/2021    Physical Findings: AIMS: Facial and Oral Movements Muscles of Facial Expression: None, normal Lips and Perioral Area: None, normal Jaw: None, normal Tongue: None, normal,Extremity Movements Upper (arms, wrists, hands, fingers): None, normal Lower (legs, knees, ankles, toes): None, normal, Trunk Movements Neck, shoulders, hips: None, normal, Overall Severity Severity of abnormal movements (highest score from questions above): None, normal Incapacitation due to abnormal movements: None, normal Patient's awareness of abnormal movements (rate only patient's report): No Awareness, Dental Status Current problems with teeth and/or dentures?: No Does patient usually wear dentures?: No  CIWA:    COWS:     Musculoskeletal: Strength & Muscle Tone: within normal limits Gait & Station: normal Patient leans: N/A  Psychiatric  Specialty Exam:  Presentation  General Appearance:  Appropriate for Environment; Casual; Neat; Well Groomed  Eye Contact: Fair  Speech: Slow  Speech Volume: Decreased  Handedness: Left   Mood and Affect  Mood: Anxious  Affect: Congruent   Thought Process  Thought Processes: Goal Directed  Descriptions of Associations:Circumstantial  Orientation:Partial  Thought Content:Scattered  History of Schizophrenia/Schizoaffective disorder:Yes  Duration of Psychotic Symptoms:Greater than six months  Hallucinations:No data recorded Ideas of Reference:None  Suicidal Thoughts:No data recorded Homicidal Thoughts:No data recorded  Sensorium  Memory: Remote Good  Judgment: Fair  Insight: Fair   Chartered certified accountant: Fair  Attention Span: Fair  Recall: Fair  Fund of Knowledge: Fair  Language: Fair   Psychomotor Activity  Psychomotor Activity:No data recorded  Assets  Assets:No data recorded  Sleep  Sleep:No data recorded   Physical Exam: Physical Exam Vitals reviewed.  Constitutional:      Appearance: Normal appearance.  HENT:     Head: Normocephalic and atraumatic.     Mouth/Throat:     Pharynx: Oropharynx is clear.  Eyes:     Pupils: Pupils are equal, round, and reactive to light.  Cardiovascular:     Rate and Rhythm: Normal rate and regular rhythm.  Pulmonary:     Effort: Pulmonary effort is normal.     Breath sounds: Normal breath sounds.  Abdominal:     General: Abdomen is flat.     Palpations: Abdomen is soft.  Musculoskeletal:        General: Normal range of motion.  Skin:    General: Skin is warm and dry.  Neurological:     General: No focal deficit present.     Mental Status: He is alert. Mental status is at baseline.  Psychiatric:        Attention and Perception: He is inattentive.        Mood and Affect: Mood normal. Affect is blunt.        Speech: He is noncommunicative.    ROS Blood pressure  (!) 139/101, pulse 90, temperature 98.1 F (36.7 C), temperature source Oral, resp. rate 20, height 5\' 8"  (1.727 m), weight 85.8 kg, SpO2 92 %. Body mass index is 28.76 kg/m.   Treatment Plan Summary: Medication management and Plan no change to medication or overall treatment plan.  Continue to focus on discharge planning  , MD 09/12/2022, 2:19 PM

## 2022-09-12 NOTE — [facility] Group Notes (Signed)
[facility] Group Notes:  (Nursing/MHT/Case Management/Adjunct)  Date:  09/12/2022  Time:  9:35 AM  Type of Therapy:   community meeting  Participation Level:  Did Not Attend    Antonieta Pert 09/12/2022, 9:35 AM

## 2022-09-12 NOTE — Progress Notes (Signed)
Patient calm and pleasant during assessment denying SI/HI/AVH. Pt observed interacting appropriately with staff and peers on the unit. Pt compliant with medication administration per MD orders. Pt given education, support, and encouragement to be active in his treatment plan. Pt being monitored Q 15 minutes for safety per unit protocol, remains safe on the unit  

## 2022-09-12 NOTE — Progress Notes (Signed)
Recreation Therapy Notes   Date: 09/12/2022  Time: 10:45 am   Location: Craft room       Behavioral response: N/A   Intervention Topic: Relaxation   Discussion/Intervention: Patient refused to attend group.   Clinical Observations/Feedback:  Patient refused to attend group.    Lundon Rosier LRT/CTRS         Johniece Hornbaker 09/12/2022 12:03 PM

## 2022-09-12 NOTE — Progress Notes (Signed)
Pt denies SI/HI/AVH and verbally agrees to approach staff if these become apparent or before harming themselves/others. Rates depression 0/10. Rates anxiety 0/10. Rates pain 0/10. Pt got agitated in the afternoon and kept yelling "[place]." The pt was fixated on this for an hour or so. Pt would not stop yelling and had an angry look on his face. Pt then started yelling illuminati. Pt calmed down eventually. Scheduled medications administered to pt, per MD orders. RN provided support and encouragement to pt. Q15 min safety checks implemented and continued. Pt safe on the unit. RN will continue to monitor and intervene as needed.  Problem: Coping: Goal: Ability to identify and develop effective coping behavior will improve Outcome: Progressing   Problem: Self-Concept: Goal: Level of anxiety will decrease Outcome: Progressing    09/12/22 0817  Psych Admission Type (Psych Patients Only)  Admission Status Involuntary  Psychosocial Assessment  Patient Complaints None  Eye Contact Fair  Facial Expression Animated  Affect Appropriate to circumstance  Speech Aphasic  Interaction Assertive  Motor Activity Slow  Appearance/Hygiene Unremarkable  Behavior Characteristics Cooperative;Appropriate to situation;Calm  Mood Pleasant;Euthymic  Thought Scientist, research (life sciences) Preoccupation  Delusions None reported or observed  Perception WDL  Hallucination None reported or observed  Judgment Impaired  Confusion None  Danger to Self  Current suicidal ideation? Denies  Danger to Others  Danger to Others None reported or observed  Danger to Others Abnormal  Harmful Behavior to others No threats or harm toward other people  Destructive Behavior No threats or harm toward property

## 2022-09-13 NOTE — BHH Counselor (Signed)
CSW called to follow up with the patient's mother [place] Thorson, [phone].  CSW unable to speak with mother or leave message as the voicemail box was full.  CSW to attempt again at a later time.  Assunta Curtis, MSW, LCSW 09/13/2022 10:08 AM

## 2022-09-13 NOTE — Plan of Care (Signed)
  Problem: Education: Goal: Ability to state activities that reduce stress will improve Outcome: Progressing   Problem: Coping: Goal: Ability to identify and develop effective coping behavior will improve Outcome: Progressing   Problem: Education: Goal: Knowledge of General Education information will improve Description: Including pain rating scale, medication(s)/side effects and non-pharmacologic comfort measures Outcome: Progressing   Problem: Self-Concept: Goal: Ability to identify factors that promote anxiety will improve Outcome: Progressing Goal: Level of anxiety will decrease Outcome: Progressing Goal: Ability to modify response to factors that promote anxiety will improve Outcome: Progressing   Problem: Education: Goal: Will be free of psychotic symptoms Outcome: Progressing   Problem: Coping: Goal: Coping ability will improve Outcome: Progressing   Problem: Activity: Goal: Interest or engagement in leisure activities will improve Outcome: Progressing   Problem: Safety: Goal: Ability to remain free from injury will improve Outcome: Progressing

## 2022-09-13 NOTE — BHH Counselor (Signed)
CSW attempted to contact the patient's mother again. CSW still unable to leave voicemail as voicemail box was full.  Assunta Curtis, MSW, LCSW 09/13/2022 3:14 PM

## 2022-09-13 NOTE — Plan of Care (Signed)
D- Patient alert and oriented to person, place, and situation. Patient presented in an animated, pleasant mood on assessment. 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 Patient does answer questions this writer asks him with a "yes" or "no" response. Patient has been fixated on Madonna, [employer]thorne, and [employer]. He listens to their songs on his personal cell phone, that was approved by leadership, for him to have. Patient also came up to the nurses station saying "cats", and wanting some pictures of them. Patient denied SI, HI, AVH, and pain at this time. Patient also denied any signs/symptoms of depression/anxiety.  A- Scheduled medications administered to patient, per MD orders. Support and encouragement provided.  Routine safety checks conducted every 15 minutes.  Patient informed to notify staff with problems or concerns.  R- No adverse drug reactions noted. Patient contracts for safety at this time. Patient compliant with medications. Patient receptive, calm, and cooperative. Patient remains safe at this time.  Problem: Education: Goal: Ability to state activities that reduce stress will improve Outcome: Progressing   Problem: Coping: Goal: Ability to identify and develop effective coping behavior will improve Outcome: Progressing   Problem: Self-Concept: Goal: Ability to identify factors that promote anxiety will improve Outcome: Progressing Goal: Level of anxiety will decrease Outcome: Progressing Goal: Ability to modify response to factors that promote anxiety will improve Outcome: Progressing   Problem: Education: Goal: Knowledge of General Education information will improve Description: Including pain rating scale, medication(s)/side effects and non-pharmacologic comfort measures Outcome: Progressing   Problem: Health Behavior/Discharge Planning: Goal: Ability to manage health-related needs will improve Outcome: Progressing    Problem: Clinical Measurements: Goal: Ability to maintain clinical measurements within normal limits will improve Outcome: Progressing Goal: Will remain free from infection Outcome: Progressing Goal: Diagnostic test results will improve Outcome: Progressing Goal: Respiratory complications will improve Outcome: Progressing Goal: Cardiovascular complication will be avoided Outcome: Progressing   Problem: Activity: Goal: Risk for activity intolerance will decrease Outcome: Progressing   Problem: Nutrition: Goal: Adequate nutrition will be maintained Outcome: Progressing   Problem: Coping: Goal: Level of anxiety will decrease Outcome: Progressing   Problem: Elimination: Goal: Will not experience complications related to bowel motility Outcome: Progressing Goal: Will not experience complications related to urinary retention Outcome: Progressing   Problem: Pain Managment: Goal: General experience of comfort will improve Outcome: Progressing   Problem: Safety: Goal: Ability to remain free from injury will improve Outcome: Progressing   Problem: Skin Integrity: Goal: Risk for impaired skin integrity will decrease Outcome: Progressing   Problem: Activity: Goal: Interest or engagement in leisure activities will improve Outcome: Progressing   Problem: Coping: Goal: Coping ability will improve Outcome: Progressing   Problem: Education: Goal: Will be free of psychotic symptoms Outcome: Progressing

## 2022-09-13 NOTE — Progress Notes (Signed)
[facility] MD Progress Note  09/13/2022 2:03 PM Adam [name]  MRN:  [id number] Subjective: Follow-up with patient with chronic schizophrenia.  No complaints.  No change to behavior. Principal Problem: Schizophrenia, chronic condition with acute exacerbation (HCC) Diagnosis: Principal Problem:   Schizophrenia, chronic condition with acute exacerbation (HCC)  Total Time spent with patient: 20 minutes  Past Psychiatric History: Past history of schizophrenia  Past Medical History:  Past Medical History:  Diagnosis Date   Myocardial infarction (HCC)    Schizophrenia (HCC)    Stroke (HCC)    TBI (traumatic brain injury) (HCC)    History reviewed. No pertinent surgical history. Family History: History reviewed. No pertinent family history. Family Psychiatric  History: See previous Social History:  Social History   Substance and Sexual Activity  Alcohol Use Not Currently     Social History   Substance and Sexual Activity  Drug Use Not Currently    Social History   Socioeconomic History   Marital status: Single    Spouse name: Not on file   Number of children: Not on file   Years of education: Not on file   Highest education level: Not on file  Occupational History   Not on file  Tobacco Use   Smoking status: Never    Passive exposure: Never   Smokeless tobacco: Never  Vaping Use   Vaping Use: Unknown  Substance and Sexual Activity   Alcohol use: Not Currently   Drug use: Not Currently   Sexual activity: Not Currently  Other Topics Concern   Not on file  Social History Narrative   Not on file   Social Determinants of Health   Financial Resource Strain: Not on file  Food Insecurity: Not on file  Transportation Needs: Not on file  Physical Activity: Not on file  Stress: Not on file  Social Connections: Not on file   Additional Social History:  Specify valuables returned:  (none)                      Sleep: Fair  Appetite:  Fair  Current  Medications: Current Facility-Administered Medications  Medication Dose Route Frequency Provider Last Rate Last Admin   acetaminophen (TYLENOL) tablet 650 mg  650 mg Oral Q6H PRN Jadence Kinlaw T, MD   650 mg at 09/09/22 1813   alum & mag hydroxide-simeth (MAALOX/MYLANTA) 200-200-20 MG/5ML suspension 30 mL  30 mL Oral Q4H PRN Achaia Garlock T, MD   30 mL at 04/26/22 2831   aspirin EC tablet 81 mg  81 mg Oral Daily Jimi Giza, [name], MD   81 mg at 09/13/22 5176   atorvastatin (LIPITOR) tablet 20 mg  20 mg Oral Daily Katee Wentland T, MD   20 mg at 09/13/22 0823   atropine 1 % ophthalmic solution 1 drop  1 drop Sublingual QID [name], MD   1 drop at 09/13/22 1219   benztropine (COGENTIN) tablet 0.5 mg  0.5 mg Oral BID Verl Whitmore T, MD   0.5 mg at 09/13/22 1607   clonazePAM (KLONOPIN) tablet 1 mg  1 mg Oral TID PRN He, [name], MD   1 mg at 09/08/22 2147   cloZAPine (CLOZARIL) tablet 300 mg  300 mg Oral QHS Diannia Hogenson T, MD   300 mg at 09/12/22 2118   diphenhydrAMINE (BENADRYL) capsule 50 mg  50 mg Oral Q6H PRN Charvez Voorhies, [name], MD   50 mg at 09/01/22 2111   Or   diphenhydrAMINE (BENADRYL) injection  50 mg  50 mg Intramuscular Q6H PRN Bodhi Moradi T, MD       feeding supplement (ENSURE ENLIVE / ENSURE PLUS) liquid 237 mL  237 mL Oral TID BM Eleaner Dibartolo T, MD   237 mL at 09/13/22 0952   gabapentin (NEURONTIN) capsule 300 mg  300 mg Oral TID Tamas Suen T, MD   300 mg at 09/13/22 1219   haloperidol (HALDOL) tablet 2 mg  2 mg Oral TID Texie Tupou, [name], MD   2 mg at 09/13/22 1219   haloperidol (HALDOL) tablet 5 mg  5 mg Oral Q6H PRN Fuquan Wilson T, MD   5 mg at 09/06/22 2319   Or   haloperidol lactate (HALDOL) injection 5 mg  5 mg Intramuscular Q6H PRN Aide Wojnar T, MD       lisinopril (ZESTRIL) tablet 5 mg  5 mg Oral Daily Stepfanie Yott T, MD   5 mg at 09/13/22 0824   magnesium hydroxide (MILK OF MAGNESIA) suspension 30 mL  30 mL Oral Daily PRN Camarion Weier T, MD   30 mL at 05/02/22 0627    metoprolol succinate (TOPROL-XL) 24 hr tablet 25 mg  25 mg Oral Daily Irene Collings T, MD   25 mg at 09/13/22 0823   ondansetron (ZOFRAN) tablet 4 mg  4 mg Oral Q8H PRN [name], NP   4 mg at 09/06/22 2320   paliperidone (INVEGA SUSTENNA) injection 156 mg  156 mg Intramuscular Q28 days Hallelujah Wysong T, MD   156 mg at 09/13/22 0952   temazepam (RESTORIL) capsule 15 mg  15 mg Oral QHS Izetta Sakamoto T, MD   15 mg at 09/12/22 2118   ziprasidone (GEODON) injection 20 mg  20 mg Intramuscular Q12H PRN Maren Wiesen, [name], MD   20 mg at 07/08/22 1453    Lab Results: No results found for this or any previous visit (from the past 48 hour(s)).  Blood Alcohol level:  Lab Results  Component Value Date   ETH <10 40/07/6760    Metabolic Disorder Labs: Lab Results  Component Value Date   HGBA1C 5.5 04/10/2022   MPG 111.15 04/10/2022   No results found for: "PROLACTIN" Lab Results  Component Value Date   CHOL 167 11/20/2021   TRIG 107 11/20/2021   HDL 26 (L) 11/20/2021   CHOLHDL 6.4 11/20/2021   VLDL 21 11/20/2021   LDLCALC 120 (H) 11/20/2021    Physical Findings: AIMS: Facial and Oral Movements Muscles of Facial Expression: None, normal Lips and Perioral Area: None, normal Jaw: None, normal Tongue: None, normal,Extremity Movements Upper (arms, wrists, hands, fingers): None, normal Lower (legs, knees, ankles, toes): None, normal, Trunk Movements Neck, shoulders, hips: None, normal, Overall Severity Severity of abnormal movements (highest score from questions above): None, normal Incapacitation due to abnormal movements: None, normal Patient's awareness of abnormal movements (rate only patient's report): No Awareness, Dental Status Current problems with teeth and/or dentures?: No Does patient usually wear dentures?: No  CIWA:    COWS:     Musculoskeletal: Strength & Muscle Tone: within normal limits Gait & Station: normal Patient leans: N/A  Psychiatric Specialty  Exam:  Presentation  General Appearance:  Appropriate for Environment; Casual; Neat; Well Groomed  Eye Contact: Fair  Speech: Slow  Speech Volume: Decreased  Handedness: Left   Mood and Affect  Mood: Anxious  Affect: Congruent   Thought Process  Thought Processes: Goal Directed  Descriptions of Associations:Circumstantial  Orientation:Partial  Thought Content:Scattered  History of  Schizophrenia/Schizoaffective disorder:Yes  Duration of Psychotic Symptoms:Greater than six months  Hallucinations:No data recorded Ideas of Reference:None  Suicidal Thoughts:No data recorded Homicidal Thoughts:No data recorded  Sensorium  Memory: Remote Good  Judgment: Fair  Insight: Fair   Chartered certified accountant: Fair  Attention Span: Fair  Recall: Fair  Fund of Knowledge: Fair  Language: Fair   Psychomotor Activity  Psychomotor Activity:No data recorded  Assets  Assets:No data recorded  Sleep  Sleep:No data recorded   Physical Exam: Physical Exam Vitals reviewed.  Constitutional:      Appearance: Normal appearance.  HENT:     Head: Normocephalic and atraumatic.     Mouth/Throat:     Pharynx: Oropharynx is clear.  Eyes:     Pupils: Pupils are equal, round, and reactive to light.  Cardiovascular:     Rate and Rhythm: Normal rate and regular rhythm.  Pulmonary:     Effort: Pulmonary effort is normal.     Breath sounds: Normal breath sounds.  Abdominal:     General: Abdomen is flat.     Palpations: Abdomen is soft.  Musculoskeletal:        General: Normal range of motion.  Skin:    General: Skin is warm and dry.  Neurological:     General: No focal deficit present.     Mental Status: He is alert. Mental status is at baseline.  Psychiatric:        Mood and Affect: Mood normal.        Thought Content: Thought content normal.    Review of Systems  Constitutional: Negative.   HENT: Negative.    Eyes: Negative.    Respiratory: Negative.    Cardiovascular: Negative.   Gastrointestinal: Negative.   Musculoskeletal: Negative.   Skin: Negative.   Neurological: Negative.   Psychiatric/Behavioral: Negative.     Blood pressure 92/72, pulse 95, temperature 98.2 F (36.8 C), temperature source Oral, resp. rate 18, height 5\' 8"  (1.727 m), weight 85.8 kg, SpO2 99 %. Body mass index is 28.76 kg/m.   Treatment Plan Summary: Plan absolutely stable.  No need for any change to treatment plan.  Entirely focused on discharge  , MD 09/13/2022, 2:03 PM

## 2022-09-13 NOTE — Progress Notes (Signed)
Eesa got to talk to his mother on the phone this evening. This has made him very happy this evening. Pleasant and cooperative on the unit this evening. Will continue to monitor. Q 15 minute safety checks in place.   C Butler-Nicholson, LPN

## 2022-09-13 NOTE — Progress Notes (Signed)
Patient refused morning vitals. This Probation officer administered scheduled morning medication. Patient tolerated medication well, without any issues. MD was notified during progression rounds, and was ok with patient receiving his scheduled medication. Staff will continue to monitor for safety. Patient remains safe on the unit.

## 2022-09-14 ENCOUNTER — Encounter: Payer: Self-pay | Admitting: Psychiatry

## 2022-09-14 NOTE — BHH Counselor (Signed)
CSW spoke with the patient's mother.  CSW followed up on funding information as requests by Unit Manager.   Mother reports that patient "has about 10,000 in his account". She reports that she believes that the patient receives both disability and social security but she is unsure and will have to find out for sure.  She reports that patient also receives money from his deceased father.  She reports that she is unsure of the amount and also would need to identify this information.    She reports that she will contact social security and follow up with this CSW,   She reports that the patient receives about $2000 a month "that [employer] has nothing to do with".    She also reports that she will need to have to take some money out of her account to cover his funeral arrangements.  She also reports that she has a answering service set up that patient's funds cover.  The answering service allows patient to call and hear her voice and leave a message.   She again reports that she is the payee.  She reports that she will also need to takeout funding to cover clothing and a tablet for the patient.  Mother asked about information on what [facility] needs the information for and CSW updated that this is for placement.  Mother asked for placement recommendations, CSW informed that to her understanding the patient was recommended for group home.  Mother expressed that she was upset about it and CSW advised that mother speak with [facility] about  it as they are pursuing placement.  CSW provided contact information on placement.   Assunta Curtis, MSW, LCSW 09/14/2022 11:10 AM

## 2022-09-14 NOTE — Progress Notes (Signed)
[facility] MD Progress Note  09/14/2022 3:47 PM Adam [name]  MRN:  [id number] Subjective: Patient seen and chart reviewed.  No significant change to his presentation.  Behavior has been pretty stable no major problem at all.  No medical issues Principal Problem: Schizophrenia, chronic condition with acute exacerbation (HCC) Diagnosis: Principal Problem:   Schizophrenia, chronic condition with acute exacerbation ([place])  Total Time spent with patient: 15 minutes  Past Psychiatric History: Past psychiatric history of schizophrenia  Past Medical History:  Past Medical History:  Diagnosis Date   Myocardial infarction ([place])    Schizophrenia ([place])    Stroke ([place])    History reviewed. No pertinent surgical history. Family History: History reviewed. No pertinent family history. Family Psychiatric  History: See previous Social History:  Social History   Substance and Sexual Activity  Alcohol Use Not Currently     Social History   Substance and Sexual Activity  Drug Use Not Currently    Social History   Socioeconomic History   Marital status: Single    Spouse name: Not on file   Number of children: Not on file   Years of education: Not on file   Highest education level: Not on file  Occupational History   Not on file  Tobacco Use   Smoking status: Never    Passive exposure: Never   Smokeless tobacco: Never  Vaping Use   Vaping Use: Unknown  Substance and Sexual Activity   Alcohol use: Not Currently   Drug use: Not Currently   Sexual activity: Not Currently  Other Topics Concern   Not on file  Social History Narrative   Not on file   Social Determinants of Health   Financial Resource Strain: Not on file  Food Insecurity: Not on file  Transportation Needs: Not on file  Physical Activity: Not on file  Stress: Not on file  Social Connections: Not on file   Additional Social History:  Specify valuables returned:  (none)                      Sleep:  Fair  Appetite:  Fair  Current Medications: Current Facility-Administered Medications  Medication Dose Route Frequency Provider Last Rate Last Admin   acetaminophen (TYLENOL) tablet 650 mg  650 mg Oral Q6H PRN Dametri Ozburn T, MD   650 mg at 09/09/22 1813   alum & mag hydroxide-simeth (MAALOX/MYLANTA) 200-200-20 MG/5ML suspension 30 mL  30 mL Oral Q4H PRN Filomena Pokorney T, MD   30 mL at 04/26/22 2376   aspirin EC tablet 81 mg  81 mg Oral Daily Thersia Petraglia, [name], MD   81 mg at 09/14/22 0825   atorvastatin (LIPITOR) tablet 20 mg  20 mg Oral Daily Myesha Stillion T, MD   20 mg at 09/14/22 0825   atropine 1 % ophthalmic solution 1 drop  1 drop Sublingual QID [name], MD   1 drop at 09/14/22 1243   benztropine (COGENTIN) tablet 0.5 mg  0.5 mg Oral BID Neftaly Swiss T, MD   0.5 mg at 09/14/22 0826   clonazePAM (KLONOPIN) tablet 1 mg  1 mg Oral TID PRN He, [name], MD   1 mg at 09/08/22 2147   cloZAPine (CLOZARIL) tablet 300 mg  300 mg Oral QHS Jaylyn Booher T, MD   300 mg at 09/13/22 2131   diphenhydrAMINE (BENADRYL) capsule 50 mg  50 mg Oral Q6H PRN Kavonte Bearse, [name], MD   50 mg at 09/01/22 2111  Or   diphenhydrAMINE (BENADRYL) injection 50 mg  50 mg Intramuscular Q6H PRN Cordon Gassett T, MD       feeding supplement (ENSURE ENLIVE / ENSURE PLUS) liquid 237 mL  237 mL Oral TID BM Tianna Baus T, MD   237 mL at 09/14/22 1015   gabapentin (NEURONTIN) capsule 300 mg  300 mg Oral TID Cherlyn Syring T, MD   300 mg at 09/14/22 1243   haloperidol (HALDOL) tablet 2 mg  2 mg Oral TID Judas Mohammad T, MD   2 mg at 09/14/22 1243   haloperidol (HALDOL) tablet 5 mg  5 mg Oral Q6H PRN Japneet Staggs T, MD   5 mg at 09/06/22 2319   Or   haloperidol lactate (HALDOL) injection 5 mg  5 mg Intramuscular Q6H PRN Jasraj Lappe T, MD       lisinopril (ZESTRIL) tablet 5 mg  5 mg Oral Daily Garnie Borchardt T, MD   5 mg at 09/14/22 0825   magnesium hydroxide (MILK OF MAGNESIA) suspension 30 mL  30 mL Oral Daily PRN Deaisa Merida  T, MD   30 mL at 05/02/22 4696   metoprolol succinate (TOPROL-XL) 24 hr tablet 25 mg  25 mg Oral Daily Indria Bishara T, MD   25 mg at 09/14/22 0825   ondansetron (ZOFRAN) tablet 4 mg  4 mg Oral Q8H PRN Lerry Liner M, NP   4 mg at 09/06/22 2320   paliperidone (INVEGA SUSTENNA) injection 156 mg  156 mg Intramuscular Q28 days Kamerin Axford T, MD   156 mg at 09/13/22 0952   temazepam (RESTORIL) capsule 15 mg  15 mg Oral QHS Leylah Tarnow T, MD   15 mg at 09/13/22 2131   ziprasidone (GEODON) injection 20 mg  20 mg Intramuscular Q12H PRN Emmitte Surgeon, [name], MD   20 mg at 07/08/22 1453    Lab Results: No results found for this or any previous visit (from the past 48 hour(s)).  Blood Alcohol level:  Lab Results  Component Value Date   ETH <10 07/20/2021    Metabolic Disorder Labs: Lab Results  Component Value Date   HGBA1C 5.5 04/10/2022   MPG 111.15 04/10/2022   No results found for: "PROLACTIN" Lab Results  Component Value Date   CHOL 167 11/20/2021   TRIG 107 11/20/2021   HDL 26 (L) 11/20/2021   CHOLHDL 6.4 11/20/2021   VLDL 21 11/20/2021   LDLCALC 120 (H) 11/20/2021    Physical Findings: AIMS: Facial and Oral Movements Muscles of Facial Expression: None, normal Lips and Perioral Area: None, normal Jaw: None, normal Tongue: None, normal,Extremity Movements Upper (arms, wrists, hands, fingers): None, normal Lower (legs, knees, ankles, toes): None, normal, Trunk Movements Neck, shoulders, hips: None, normal, Overall Severity Severity of abnormal movements (highest score from questions above): None, normal Incapacitation due to abnormal movements: None, normal Patient's awareness of abnormal movements (rate only patient's report): No Awareness, Dental Status Current problems with teeth and/or dentures?: No Does patient usually wear dentures?: No  CIWA:    COWS:     Musculoskeletal: Strength & Muscle Tone: within normal limits Gait & Station: normal Patient leans:  N/A  Psychiatric Specialty Exam:  Presentation  General Appearance:  Appropriate for Environment; Casual; Neat; Well Groomed  Eye Contact: Fair  Speech: Slow  Speech Volume: Decreased  Handedness: Left   Mood and Affect  Mood: Anxious  Affect: Congruent   Thought Process  Thought Processes: Goal Directed  Descriptions of Associations:Circumstantial  Orientation:Partial  Thought Content:Scattered  History of Schizophrenia/Schizoaffective disorder:Yes  Duration of Psychotic Symptoms:Greater than six months  Hallucinations:No data recorded Ideas of Reference:None  Suicidal Thoughts:No data recorded Homicidal Thoughts:No data recorded  Sensorium  Memory: Remote Good  Judgment: Fair  Insight: Fair   Chartered certified accountant: Fair  Attention Span: Fair  Recall: [employer] of Knowledge: Fair  Language: Fair   Psychomotor Activity  Psychomotor Activity:No data recorded  Assets  Assets:No data recorded  Sleep  Sleep:No data recorded   Physical Exam: Physical Exam Vitals and nursing note reviewed.  Constitutional:      Appearance: Normal appearance.  HENT:     Head: Normocephalic and atraumatic.     Mouth/Throat:     Pharynx: Oropharynx is clear.  Eyes:     Pupils: Pupils are equal, round, and reactive to light.  Cardiovascular:     Rate and Rhythm: Normal rate and regular rhythm.  Pulmonary:     Effort: Pulmonary effort is normal.     Breath sounds: Normal breath sounds.  Abdominal:     General: Abdomen is flat.     Palpations: Abdomen is soft.  Musculoskeletal:        General: Normal range of motion.  Skin:    General: Skin is warm and dry.  Neurological:     General: No focal deficit present.     Mental Status: He is alert. Mental status is at baseline.  Psychiatric:        Attention and Perception: Attention normal.        Mood and Affect: Mood normal.        Speech: Speech normal.         Behavior: Behavior is cooperative.        Thought Content: Thought content normal.        Cognition and Memory: Cognition normal.    Review of Systems  Unable to perform ROS: Language   Blood pressure 102/71, pulse 100, temperature 98.2 F (36.8 C), temperature source Oral, resp. rate 16, height 5\' 8"  (1.727 m), weight 85.8 kg, SpO2 100 %. Body mass index is 28.76 kg/m.   Treatment Plan Summary: Medication management and Plan continue all treatment just as we have been.  Entirely focused on discharge  , MD 09/14/2022, 3:47 PM

## 2022-09-14 NOTE — BHH Counselor (Signed)
CSW contacted [facility] to assist with identifying any additional information on patient's funding.  Financial Counselor was unable to provide any assistance.  She suggested that CSW contact [facility], where [employer] is out of for any additional information.  Patient's Medicaid was originally out of [facility].    CSW was provided the following contact info, [phone].  Assunta Curtis, MSW, LCSW 09/14/2022 4:27 PM

## 2022-09-14 NOTE — Progress Notes (Signed)
Recreation Therapy Notes     Date: 09/14/2022   Time: 1:30pm    Location: Court yard       Behavioral response: N/A   Intervention Topic: Decision Making    Discussion/Intervention: Patient refused to attend group.    Clinical Observations/Feedback:  Patient refused to attend group.    Arzell Mcgeehan LRT/CTRS        Eagan Shifflett 09/14/2022 2:02 PM

## 2022-09-14 NOTE — Plan of Care (Signed)
  Problem: Self-Concept: Goal: Level of anxiety will decrease Outcome: Progressing   Problem: Nutrition: Goal: Adequate nutrition will be maintained Outcome: Progressing

## 2022-09-14 NOTE — Plan of Care (Signed)
D- Patient alert and oriented to person, place, and situation. Patient presents in a pleasant mood on assessment. 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 Patient does answer questions with a "yes" or "no" response. Today, patient has been fixated on [place], [place], and [place]. He likes to listen to [facility] on his personal cell phone, that was approved by leadership, for him to have. Patient denied SI, HI, AVH, and pain at this time. Patient also denied any signs/symptoms of depression/anxiety, saying "no", when this writer asked him about it. Patient had no stated goals for today.  A- Scheduled medications administered to patient, per MD orders. Support and encouragement provided.  Routine safety checks conducted every 15 minutes.  Patient informed to notify staff with problems or concerns.  R- No adverse drug reactions noted. Patient contracts for safety at this time. Patient compliant with medications and treatment plan. Patient receptive, calm, and cooperative. Patient remains safe at this time.  Problem: Education: Goal: Ability to state activities that reduce stress will improve Outcome: Progressing   Problem: Coping: Goal: Ability to identify and develop effective coping behavior will improve Outcome: Progressing   Problem: Self-Concept: Goal: Ability to identify factors that promote anxiety will improve Outcome: Progressing Goal: Level of anxiety will decrease Outcome: Progressing Goal: Ability to modify response to factors that promote anxiety will improve Outcome: Progressing   Problem: Education: Goal: Knowledge of General Education information will improve Description: Including pain rating scale, medication(s)/side effects and non-pharmacologic comfort measures Outcome: Progressing   Problem: Health Behavior/Discharge Planning: Goal: Ability to manage health-related needs will improve Outcome: Progressing   Problem:  Clinical Measurements: Goal: Ability to maintain clinical measurements within normal limits will improve Outcome: Progressing Goal: Will remain free from infection Outcome: Progressing Goal: Diagnostic test results will improve Outcome: Progressing Goal: Respiratory complications will improve Outcome: Progressing Goal: Cardiovascular complication will be avoided Outcome: Progressing   Problem: Activity: Goal: Risk for activity intolerance will decrease Outcome: Progressing   Problem: Nutrition: Goal: Adequate nutrition will be maintained Outcome: Progressing   Problem: Coping: Goal: Level of anxiety will decrease Outcome: Progressing   Problem: Elimination: Goal: Will not experience complications related to bowel motility Outcome: Progressing Goal: Will not experience complications related to urinary retention Outcome: Progressing   Problem: Pain Managment: Goal: General experience of comfort will improve Outcome: Progressing   Problem: Safety: Goal: Ability to remain free from injury will improve Outcome: Progressing   Problem: Skin Integrity: Goal: Risk for impaired skin integrity will decrease Outcome: Progressing   Problem: Activity: Goal: Interest or engagement in leisure activities will improve Outcome: Progressing   Problem: Coping: Goal: Coping ability will improve Outcome: Progressing   Problem: Education: Goal: Will be free of psychotic symptoms Outcome: Progressing

## 2022-09-14 NOTE — Progress Notes (Signed)
Adam [name] had a good day today.  Was in a pleasant mood since the start of the shift. Active on the unit and engaging with his peers. No behavioral issue to note.  He is med compliant and received his meds without incident. Denies si  hi avh  depression and anxiety.  Will continue to  monitor. Safe on the unit with q15 minute safety checks.    C Butler-Nicholson, LPN

## 2022-09-15 MED ORDER — SENNOSIDES-DOCUSATE SODIUM 8.6-50 MG PO TABS
2.0000 | ORAL_TABLET | Freq: Every evening | ORAL | Status: DC | PRN
Start: 2022-09-15 — End: 2023-02-19
  Administered 2022-09-15 – 2023-01-20 (×2): 2 via ORAL
  Filled 2022-09-15 (×2): qty 2

## 2022-09-15 NOTE — Plan of Care (Signed)
Pt denies anxiety/depression at this time however, presents as irritable. Pt denies SI/HI/AVH or pain at this time. Pt is calm and cooperative. Pt is medication compliant. Pt provided with support and encouragement. Pt monitored q15 minutes for safety per unit policy. Plan of care ongoing.  At beginning of shift pt was persistently at the nurse's station constantly repeating himself. Pt was irritable initially refused ensure that he later came back wanting it. Ensure was given late. Pt initially would not speak to this Probation officer or answer assessment questions. Pt was asked it something happened, is something bothering him that he would like to talk about. Pt ignored this Probation officer until he wanted to request something of this Probation officer or the other Therapist, sports. Pt was being excessively loud while other pts were sleeping and was reminded that he needed to be quiet so the other pt's could rest. Pt eventually went to bed.    Problem: Nutrition: Goal: Adequate nutrition will be maintained Outcome: Progressing   Problem: Pain Managment: Goal: General experience of comfort will improve Outcome: Progressing

## 2022-09-15 NOTE — Plan of Care (Signed)
D- Patient alert and oriented to person, place, and situation. Patient presented in a preoccupied, but pleasant mood on assessment stating that he slept good last night, with no complaints to voice to this Probation officer. Patient denied SI, HI, AVH, and pain at this time. Patient also denied any signs/symptoms of depression/anxiety. Patient had no stated goals for today.  A- Scheduled medications administered to patient, per MD orders. Support and encouragement provided.  Routine safety checks conducted every 15 minutes.  Patient informed to notify staff with problems or concerns.  R- No adverse drug reactions noted. Patient contracts for safety at this time. Patient compliant with medications. Patient receptive, calm, and cooperative. Patient remains safe at this time.  Problem: Education: Goal: Ability to state activities that reduce stress will improve Outcome: Progressing   Problem: Coping: Goal: Ability to identify and develop effective coping behavior will improve Outcome: Progressing   Problem: Self-Concept: Goal: Ability to identify factors that promote anxiety will improve Outcome: Progressing Goal: Level of anxiety will decrease Outcome: Progressing Goal: Ability to modify response to factors that promote anxiety will improve Outcome: Progressing   Problem: Education: Goal: Knowledge of General Education information will improve Description: Including pain rating scale, medication(s)/side effects and non-pharmacologic comfort measures Outcome: Progressing   Problem: Health Behavior/Discharge Planning: Goal: Ability to manage health-related needs will improve Outcome: Progressing   Problem: Clinical Measurements: Goal: Ability to maintain clinical measurements within normal limits will improve Outcome: Progressing Goal: Will remain free from infection Outcome: Progressing Goal: Diagnostic test results will improve Outcome: Progressing Goal: Respiratory complications will  improve Outcome: Progressing Goal: Cardiovascular complication will be avoided Outcome: Progressing   Problem: Activity: Goal: Risk for activity intolerance will decrease Outcome: Progressing   Problem: Nutrition: Goal: Adequate nutrition will be maintained Outcome: Progressing   Problem: Coping: Goal: Level of anxiety will decrease Outcome: Progressing   Problem: Elimination: Goal: Will not experience complications related to bowel motility Outcome: Progressing Goal: Will not experience complications related to urinary retention Outcome: Progressing   Problem: Pain Managment: Goal: General experience of comfort will improve Outcome: Progressing   Problem: Safety: Goal: Ability to remain free from injury will improve Outcome: Progressing   Problem: Skin Integrity: Goal: Risk for impaired skin integrity will decrease Outcome: Progressing   Problem: Activity: Goal: Interest or engagement in leisure activities will improve Outcome: Progressing   Problem: Coping: Goal: Coping ability will improve Outcome: Progressing   Problem: Education: Goal: Will be free of psychotic symptoms Outcome: Progressing

## 2022-09-15 NOTE — Progress Notes (Signed)
Patient is in a mood right now. He didn't eat lunch, and when he came to get scheduled noon medication, he spit them back into his cup, threw it away and walked off. MD will be notified via secure chat.

## 2022-09-15 NOTE — Progress Notes (Signed)
Patient came to the nurses station with his shaving cream in hand. This writer knows from previous encounters that patient wants to shave. Patient tolerated shaving without any issues.

## 2022-09-15 NOTE — Progress Notes (Signed)
Patient just came up to the nurses station, pointing at this writer and then pointing to the medication room door. This Probation officer went out to see what patient wants. Patient kept pointing to the medication room door, as if he's going to take his medication. This Probation officer gave patient his Haldol, in which he took with no problem this time around.

## 2022-09-15 NOTE — Progress Notes (Addendum)
Patient was heard in the bathroom, what sounded as if he was straining. This Probation officer asked patient if he needed a laxative or a stool softener, and he said "stool softener". This Probation officer informed MD when he came in of patient's request. Patient then comes up to the medication room and sits in the chair. This writer walks out to see what patient wants and he points to the medication room door. Patient started crying and motioning a circle. This Probation officer didn't understand what he was trying to say, so he got upset, hollered and walked off. Patient then came back and said "constipation". This Probation officer then said Milk of Magnesia and patient's face brightened up and said "yes, Milk of Magnesia". Patient was given MOM, in which he tolerated administration well, without any issues. Patient smiled and walked off back to his room.

## 2022-09-15 NOTE — Progress Notes (Signed)
[facility] MD Progress Note  09/15/2022 2:42 PM Momen Ham  MRN:  [id number] Subjective: Adam [name] is seen on rounds.  He is a little bit more agitated today and according to the nurses spit out his medications.  No other issues.  No complaints.  Principal Problem: Schizophrenia, chronic condition with acute exacerbation (HCC) Diagnosis: Principal Problem:   Schizophrenia, chronic condition with acute exacerbation ([place])  Total Time spent with patient: 15 minutes  Past Psychiatric History: CVA and schizophrenia Past Medical History:  Past Medical History:  Diagnosis Date   Myocardial infarction ([place])    Schizophrenia ([place])    Stroke ([place])    History reviewed. No pertinent surgical history. Family History: History reviewed. No pertinent family history.  Social History:  Social History   Substance and Sexual Activity  Alcohol Use Not Currently     Social History   Substance and Sexual Activity  Drug Use Not Currently    Social History   Socioeconomic History   Marital status: Single    Spouse name: Not on file   Number of children: Not on file   Years of education: Not on file   Highest education level: Not on file  Occupational History   Not on file  Tobacco Use   Smoking status: Never    Passive exposure: Never   Smokeless tobacco: Never  Vaping Use   Vaping Use: Unknown  Substance and Sexual Activity   Alcohol use: Not Currently   Drug use: Not Currently   Sexual activity: Not Currently  Other Topics Concern   Not on file  Social History Narrative   Not on file   Social Determinants of Health   Financial Resource Strain: Not on file  Food Insecurity: Not on file  Transportation Needs: Not on file  Physical Activity: Not on file  Stress: Not on file  Social Connections: Not on file   Additional Social History:  Specify valuables returned:  (none)                      Sleep: Good  Appetite:  Good  Current Medications: Current  Facility-Administered Medications  Medication Dose Route Frequency Provider Last Rate Last Admin   acetaminophen (TYLENOL) tablet 650 mg  650 mg Oral Q6H PRN [name], [name], MD   650 mg at 09/09/22 1813   alum & mag hydroxide-simeth (MAALOX/MYLANTA) 200-200-20 MG/5ML suspension 30 mL  30 mL Oral Q4H PRN [name], [name], MD   30 mL at 04/26/22 9678   aspirin EC tablet 81 mg  81 mg Oral Daily [name], [name], MD   81 mg at 09/15/22 0950   atorvastatin (LIPITOR) tablet 20 mg  20 mg Oral Daily [name], [name], MD   20 mg at 09/15/22 0950   atropine 1 % ophthalmic solution 1 drop  1 drop Sublingual QID [name], MD   1 drop at 09/15/22 1201   benztropine (COGENTIN) tablet 0.5 mg  0.5 mg Oral BID [name], [name], MD   0.5 mg at 09/15/22 0951   clonazePAM (KLONOPIN) tablet 1 mg  1 mg Oral TID PRN He, [name], MD   1 mg at 09/08/22 2147   cloZAPine (CLOZARIL) tablet 300 mg  300 mg Oral QHS [name], [name], MD   300 mg at 09/14/22 2107   diphenhydrAMINE (BENADRYL) capsule 50 mg  50 mg Oral Q6H PRN [name], [name], MD   50 mg at 09/01/22 2111   Or   diphenhydrAMINE (  BENADRYL) injection 50 mg  50 mg Intramuscular Q6H PRN [name], [name], MD       feeding supplement (ENSURE ENLIVE / ENSURE PLUS) liquid 237 mL  237 mL Oral TID BM [name], [name], MD   237 mL at 09/15/22 0951   gabapentin (NEURONTIN) capsule 300 mg  300 mg Oral TID [name], [name], MD   300 mg at 09/15/22 0950   haloperidol (HALDOL) tablet 2 mg  2 mg Oral TID [name], [name], MD   2 mg at 09/15/22 1347   haloperidol (HALDOL) tablet 5 mg  5 mg Oral Q6H PRN [name], [name], MD   5 mg at 09/06/22 2319   Or   haloperidol lactate (HALDOL) injection 5 mg  5 mg Intramuscular Q6H PRN [name], [name], MD       lisinopril (ZESTRIL) tablet 5 mg  5 mg Oral Daily [name], [name], MD   5 mg at 09/15/22 0950   magnesium hydroxide (MILK OF MAGNESIA) suspension 30 mL  30 mL Oral Daily PRN [name], [name], MD   30 mL at 09/15/22 1047   metoprolol succinate  (TOPROL-XL) 24 hr tablet 25 mg  25 mg Oral Daily [name], [name], MD   25 mg at 09/15/22 0950   ondansetron (ZOFRAN) tablet 4 mg  4 mg Oral Q8H PRN Jearld Lesch, NP   4 mg at 09/06/22 2320   paliperidone (INVEGA SUSTENNA) injection 156 mg  156 mg Intramuscular Q28 days [name], [name], MD   156 mg at 09/13/22 0240   senna-docusate (Senokot-S) tablet 2 tablet  2 tablet Oral QHS PRN Sarina Ill, DO       temazepam (RESTORIL) capsule 15 mg  15 mg Oral QHS [name], [name], MD   15 mg at 09/14/22 2107   ziprasidone (GEODON) injection 20 mg  20 mg Intramuscular Q12H PRN [name], [name], MD   20 mg at 07/08/22 1453    Lab Results: No results found for this or any previous visit (from the past 48 hour(s)).  Blood Alcohol level:  Lab Results  Component Value Date   ETH <10 07/20/2021    Metabolic Disorder Labs: Lab Results  Component Value Date   HGBA1C 5.5 04/10/2022   MPG 111.15 04/10/2022   No results found for: "PROLACTIN" Lab Results  Component Value Date   CHOL 167 11/20/2021   TRIG 107 11/20/2021   HDL 26 (L) 11/20/2021   CHOLHDL 6.4 11/20/2021   VLDL 21 11/20/2021   LDLCALC 120 (H) 11/20/2021    Physical Findings: AIMS: Facial and Oral Movements Muscles of Facial Expression: None, normal Lips and Perioral Area: None, normal Jaw: None, normal Tongue: None, normal,Extremity Movements Upper (arms, wrists, hands, fingers): None, normal Lower (legs, knees, ankles, toes): None, normal, Trunk Movements Neck, shoulders, hips: None, normal, Overall Severity Severity of abnormal movements (highest score from questions above): None, normal Incapacitation due to abnormal movements: None, normal Patient's awareness of abnormal movements (rate only patient's report): No Awareness, Dental Status Current problems with teeth and/or dentures?: No Does patient usually wear dentures?: No  CIWA:    COWS:     Musculoskeletal: Strength & Muscle Tone: within normal  limits Gait & Station: normal Patient leans: N/A  Psychiatric Specialty Exam:  Presentation  General Appearance:  Appropriate for Environment; Casual; Neat; Well Groomed  Eye Contact: Fair  Speech: Slow  Speech Volume: Decreased  Handedness: Left   Mood and Affect  Mood: Anxious  Affect: Congruent   Thought Process  Thought Processes: Goal Directed  Descriptions of Associations:Circumstantial  Orientation:Partial  Thought Content:Scattered  History of Schizophrenia/Schizoaffective disorder:Yes  Duration of Psychotic Symptoms:Greater than six months  Hallucinations:No data recorded Ideas of Reference:None  Suicidal Thoughts:No data recorded Homicidal Thoughts:No data recorded  Sensorium  Memory: Remote Good  Judgment: Fair  Insight: Fair   Chartered certified accountant: Fair  Attention Span: Fair  Recall: Fair  Fund of Knowledge: Fair  Language: Fair   Psychomotor Activity  Psychomotor Activity:No data recorded  Assets  Assets:No data recorded  Sleep  Sleep:No data recorded    Blood pressure 116/68, pulse 89, temperature (!) 97.5 F (36.4 C), temperature source Oral, resp. rate 18, height 5\' 8"  (1.727 m), weight 85.8 kg, SpO2 100 %. Body mass index is 28.76 kg/m.   Treatment Plan Summary: Daily contact with patient to assess and evaluate symptoms and progress in treatment, continue current medications.  Duwane Gewirtz , DO 09/15/2022, 2:42 PM

## 2022-09-15 NOTE — Progress Notes (Signed)
Patient is still sleeping,so this Probation officer will administer scheduled morning medications once he wakes up.

## 2022-09-16 NOTE — Progress Notes (Signed)
[facility] MD Progress Note  09/16/2022 11:22 AM Adam [name]  MRN:  [id number] Subjective: No changes with Adam [name] on this day.  He has been compliant with his medications and no side effects.  Principal Problem: Schizophrenia, chronic condition with acute exacerbation ([place]) Diagnosis: Principal Problem:   Schizophrenia, chronic condition with acute exacerbation ([place])  Total Time spent with patient: 15 minutes  Past Psychiatric History: Schizophrenia and history of CVA  Past Medical History:  Past Medical History:  Diagnosis Date   Myocardial infarction ([place])    Schizophrenia ([place])    Stroke ([place])    History reviewed. No pertinent surgical history. Family History: History reviewed. No pertinent family history.  Social History:  Social History   Substance and Sexual Activity  Alcohol Use Not Currently     Social History   Substance and Sexual Activity  Drug Use Not Currently    Social History   Socioeconomic History   Marital status: Single    Spouse name: Not on file   Number of children: Not on file   Years of education: Not on file   Highest education level: Not on file  Occupational History   Not on file  Tobacco Use   Smoking status: Never    Passive exposure: Never   Smokeless tobacco: Never  Vaping Use   Vaping Use: Unknown  Substance and Sexual Activity   Alcohol use: Not Currently   Drug use: Not Currently   Sexual activity: Not Currently  Other Topics Concern   Not on file  Social History Narrative   Not on file   Social Determinants of Health   Financial Resource Strain: Not on file  Food Insecurity: Not on file  Transportation Needs: Not on file  Physical Activity: Not on file  Stress: Not on file  Social Connections: Not on file   Additional Social History:  Specify valuables returned:  (none)                      Sleep: Good  Appetite:  Good  Current Medications: Current Facility-Administered Medications  Medication Dose Route  Frequency Provider Last Rate Last Admin   acetaminophen (TYLENOL) tablet 650 mg  650 mg Oral Q6H PRN [name], [name], MD   650 mg at 09/09/22 1813   alum & mag hydroxide-simeth (MAALOX/MYLANTA) 200-200-20 MG/5ML suspension 30 mL  30 mL Oral Q4H PRN [name], [name], MD   30 mL at 04/26/22 4081   aspirin EC tablet 81 mg  81 mg Oral Daily [name], [name], MD   81 mg at 09/16/22 1026   atorvastatin (LIPITOR) tablet 20 mg  20 mg Oral Daily [name], [name], MD   20 mg at 09/16/22 1026   atropine 1 % ophthalmic solution 1 drop  1 drop Sublingual QID [name], MD   1 drop at 09/16/22 1028   benztropine (COGENTIN) tablet 0.5 mg  0.5 mg Oral BID [name], [name], MD   0.5 mg at 09/16/22 1026   clonazePAM (KLONOPIN) tablet 1 mg  1 mg Oral TID PRN He, [name], MD   1 mg at 09/08/22 2147   cloZAPine (CLOZARIL) tablet 300 mg  300 mg Oral QHS [name], [name], MD   300 mg at 09/15/22 2109   diphenhydrAMINE (BENADRYL) capsule 50 mg  50 mg Oral Q6H PRN [name], [name], MD   50 mg at 09/01/22 2111   Or   diphenhydrAMINE (BENADRYL) injection 50 mg  50 mg Intramuscular  Q6H PRN [name], [name], MD       feeding supplement (ENSURE ENLIVE / ENSURE PLUS) liquid 237 mL  237 mL Oral TID BM [name], [name], MD   237 mL at 09/15/22 2147   gabapentin (NEURONTIN) capsule 300 mg  300 mg Oral TID [name], [name], MD   300 mg at 09/16/22 1028   haloperidol (HALDOL) tablet 2 mg  2 mg Oral TID [name], [name], MD   2 mg at 09/16/22 1025   haloperidol (HALDOL) tablet 5 mg  5 mg Oral Q6H PRN [name], [name], MD   5 mg at 09/06/22 2319   Or   haloperidol lactate (HALDOL) injection 5 mg  5 mg Intramuscular Q6H PRN [name], [name], MD       lisinopril (ZESTRIL) tablet 5 mg  5 mg Oral Daily [name], [name], MD   5 mg at 09/16/22 1026   magnesium hydroxide (MILK OF MAGNESIA) suspension 30 mL  30 mL Oral Daily PRN [name], [name], MD   30 mL at 09/15/22 1047   metoprolol succinate (TOPROL-XL) 24 hr tablet 25 mg  25 mg Oral Daily [name],  [name], MD   25 mg at 09/16/22 1027   ondansetron (ZOFRAN) tablet 4 mg  4 mg Oral Q8H PRN Lerry Liner M, NP   4 mg at 09/06/22 2320   paliperidone (INVEGA SUSTENNA) injection 156 mg  156 mg Intramuscular Q28 days [name], [name], MD   156 mg at 09/13/22 3536   senna-docusate (Senokot-S) tablet 2 tablet  2 tablet Oral QHS PRN Sarina Ill, DO   2 tablet at 09/15/22 2110   temazepam (RESTORIL) capsule 15 mg  15 mg Oral QHS [name], [name], MD   15 mg at 09/15/22 2110   ziprasidone (GEODON) injection 20 mg  20 mg Intramuscular Q12H PRN [name], [name], MD   20 mg at 07/08/22 1453    Lab Results: No results found for this or any previous visit (from the past 48 hour(s)).  Blood Alcohol level:  Lab Results  Component Value Date   ETH <10 07/20/2021    Metabolic Disorder Labs: Lab Results  Component Value Date   HGBA1C 5.5 04/10/2022   MPG 111.15 04/10/2022   No results found for: "PROLACTIN" Lab Results  Component Value Date   CHOL 167 11/20/2021   TRIG 107 11/20/2021   HDL 26 (L) 11/20/2021   CHOLHDL 6.4 11/20/2021   VLDL 21 11/20/2021   LDLCALC 120 (H) 11/20/2021    Physical Findings: AIMS: Facial and Oral Movements Muscles of Facial Expression: None, normal Lips and Perioral Area: None, normal Jaw: None, normal Tongue: None, normal,Extremity Movements Upper (arms, wrists, hands, fingers): None, normal Lower (legs, knees, ankles, toes): None, normal, Trunk Movements Neck, shoulders, hips: None, normal, Overall Severity Severity of abnormal movements (highest score from questions above): None, normal Incapacitation due to abnormal movements: None, normal Patient's awareness of abnormal movements (rate only patient's report): No Awareness, Dental Status Current problems with teeth and/or dentures?: No Does patient usually wear dentures?: No  CIWA:    COWS:     Musculoskeletal: Strength & Muscle Tone: within normal limits Gait & Station: normal Patient  leans: N/A  Psychiatric Specialty Exam:  Presentation  General Appearance:  Appropriate for Environment; Casual; Neat; Well Groomed  Eye Contact: Fair  Speech: Slow  Speech Volume: Decreased  Handedness: Left   Mood and Affect  Mood: Anxious  Affect: Congruent   Thought  Process  Thought Processes: Goal Directed  Descriptions of Associations:Circumstantial  Orientation:Partial  Thought Content:Scattered  History of Schizophrenia/Schizoaffective disorder:Yes  Duration of Psychotic Symptoms:Greater than six months  Hallucinations:No data recorded Ideas of Reference:None  Suicidal Thoughts:No data recorded Homicidal Thoughts:No data recorded  Sensorium  Memory: Remote Good  Judgment: Fair  Insight: Fair   Chartered certified accountant: Fair  Attention Span: Fair  Recall: Fair  Fund of Knowledge: Fair  Language: Fair   Psychomotor Activity  Psychomotor Activity:No data recorded  Assets  Assets:No data recorded  Sleep  Sleep:No data recorded    Blood pressure 129/81, pulse (!) 102, temperature 98.7 F (37.1 C), temperature source Oral, resp. rate 18, height 5\' 8"  (1.727 m), weight 85.8 kg, SpO2 100 %. Body mass index is 28.76 kg/m.   Treatment Plan Summary: Daily contact with patient to assess and evaluate symptoms and progress in treatment, Medication management, and Plan continue to look for placement.  Continue current medications.  Adam [name] , DO 09/16/2022, 11:22 AM

## 2022-09-16 NOTE — Plan of Care (Signed)
  Problem: Coping: Goal: Level of anxiety will decrease Outcome: Progressing   Problem: Coping: Goal: Coping ability will improve Outcome: Progressing   

## 2022-09-16 NOTE — Progress Notes (Signed)
[facility] MD Progress Note  09/16/2022 11:21 AM Adam [name]  MRN:  [id number] Subjective: No changes with Adam [name].  Except, he did spit his medicines out last night but took them's morning.  Principal Problem: Schizophrenia, chronic condition with acute exacerbation (HCC) Diagnosis: Principal Problem:   Schizophrenia, chronic condition with acute exacerbation (HCC)  Total Time spent with patient: 15 minutes  Past Psychiatric History: Schizophrenia and CVA.  Past Medical History:  Past Medical History:  Diagnosis Date   Myocardial infarction (HCC)    Schizophrenia (HCC)    Stroke [facility])    History reviewed. No pertinent surgical history. Family History: History reviewed. No pertinent family history.  Social History:  Social History   Substance and Sexual Activity  Alcohol Use Not Currently     Social History   Substance and Sexual Activity  Drug Use Not Currently    Social History   Socioeconomic History   Marital status: Single    Spouse name: Not on file   Number of children: Not on file   Years of education: Not on file   Highest education level: Not on file  Occupational History   Not on file  Tobacco Use   Smoking status: Never    Passive exposure: Never   Smokeless tobacco: Never  Vaping Use   Vaping Use: Unknown  Substance and Sexual Activity   Alcohol use: Not Currently   Drug use: Not Currently   Sexual activity: Not Currently  Other Topics Concern   Not on file  Social History Narrative   Not on file   Social Determinants of Health   Financial Resource Strain: Not on file  Food Insecurity: Not on file  Transportation Needs: Not on file  Physical Activity: Not on file  Stress: Not on file  Social Connections: Not on file   Additional Social History:  Specify valuables returned:  (none)                      Sleep: Good  Appetite:  Good  Current Medications: Current Facility-Administered Medications  Medication Dose Route Frequency  Provider Last Rate Last Admin   acetaminophen (TYLENOL) tablet 650 mg  650 mg Oral Q6H PRN [name], [name], MD   650 mg at 09/09/22 1813   alum & mag hydroxide-simeth (MAALOX/MYLANTA) 200-200-20 MG/5ML suspension 30 mL  30 mL Oral Q4H PRN [name], [name], MD   30 mL at 04/26/22 2831   aspirin EC tablet 81 mg  81 mg Oral Daily [name], [name], MD   81 mg at 09/16/22 1026   atorvastatin (LIPITOR) tablet 20 mg  20 mg Oral Daily [name], [name], MD   20 mg at 09/16/22 1026   atropine 1 % ophthalmic solution 1 drop  1 drop Sublingual QID [name], MD   1 drop at 09/16/22 1028   benztropine (COGENTIN) tablet 0.5 mg  0.5 mg Oral BID [name], [name], MD   0.5 mg at 09/16/22 1026   clonazePAM (KLONOPIN) tablet 1 mg  1 mg Oral TID PRN He, [name], MD   1 mg at 09/08/22 2147   cloZAPine (CLOZARIL) tablet 300 mg  300 mg Oral QHS [name], [name], MD   300 mg at 09/15/22 2109   diphenhydrAMINE (BENADRYL) capsule 50 mg  50 mg Oral Q6H PRN [name], [name], MD   50 mg at 09/01/22 2111   Or   diphenhydrAMINE (BENADRYL) injection 50 mg  50 mg Intramuscular Q6H PRN [name],  [name], MD       feeding supplement (ENSURE ENLIVE / ENSURE PLUS) liquid 237 mL  237 mL Oral TID BM [name], [name], MD   237 mL at 09/15/22 2147   gabapentin (NEURONTIN) capsule 300 mg  300 mg Oral TID [name], [name], MD   300 mg at 09/16/22 1028   haloperidol (HALDOL) tablet 2 mg  2 mg Oral TID [name], [name], MD   2 mg at 09/16/22 1025   haloperidol (HALDOL) tablet 5 mg  5 mg Oral Q6H PRN [name], [name], MD   5 mg at 09/06/22 2319   Or   haloperidol lactate (HALDOL) injection 5 mg  5 mg Intramuscular Q6H PRN [name], [name], MD       lisinopril (ZESTRIL) tablet 5 mg  5 mg Oral Daily [name], [name], MD   5 mg at 09/16/22 1026   magnesium hydroxide (MILK OF MAGNESIA) suspension 30 mL  30 mL Oral Daily PRN [name], [name], MD   30 mL at 09/15/22 1047   metoprolol succinate (TOPROL-XL) 24 hr tablet 25 mg  25 mg Oral Daily [name], [name], MD    25 mg at 09/16/22 1027   ondansetron (ZOFRAN) tablet 4 mg  4 mg Oral Q8H PRN Lerry Liner M, NP   4 mg at 09/06/22 2320   paliperidone (INVEGA SUSTENNA) injection 156 mg  156 mg Intramuscular Q28 days [name], [name], MD   156 mg at 09/13/22 3419   senna-docusate (Senokot-S) tablet 2 tablet  2 tablet Oral QHS PRN Sarina Ill, DO   2 tablet at 09/15/22 2110   temazepam (RESTORIL) capsule 15 mg  15 mg Oral QHS [name], [name], MD   15 mg at 09/15/22 2110   ziprasidone (GEODON) injection 20 mg  20 mg Intramuscular Q12H PRN [name], [name], MD   20 mg at 07/08/22 1453    Lab Results: No results found for this or any previous visit (from the past 48 hour(s)).  Blood Alcohol level:  Lab Results  Component Value Date   ETH <10 07/20/2021    Metabolic Disorder Labs: Lab Results  Component Value Date   HGBA1C 5.5 04/10/2022   MPG 111.15 04/10/2022   No results found for: "PROLACTIN" Lab Results  Component Value Date   CHOL 167 11/20/2021   TRIG 107 11/20/2021   HDL 26 (L) 11/20/2021   CHOLHDL 6.4 11/20/2021   VLDL 21 11/20/2021   LDLCALC 120 (H) 11/20/2021    Physical Findings: AIMS: Facial and Oral Movements Muscles of Facial Expression: None, normal Lips and Perioral Area: None, normal Jaw: None, normal Tongue: None, normal,Extremity Movements Upper (arms, wrists, hands, fingers): None, normal Lower (legs, knees, ankles, toes): None, normal, Trunk Movements Neck, shoulders, hips: None, normal, Overall Severity Severity of abnormal movements (highest score from questions above): None, normal Incapacitation due to abnormal movements: None, normal Patient's awareness of abnormal movements (rate only patient's report): No Awareness, Dental Status Current problems with teeth and/or dentures?: No Does patient usually wear dentures?: No  CIWA:    COWS:     Musculoskeletal: Strength & Muscle Tone: within normal limits Gait & Station: normal Patient leans:  N/A  Psychiatric Specialty Exam:  Presentation  General Appearance:  Appropriate for Environment; Casual; Neat; Well Groomed  Eye Contact: Fair  Speech: Slow  Speech Volume: Decreased  Handedness: Left   Mood and Affect  Mood: Anxious  Affect: Congruent   Thought Process  Thought  Processes: Goal Directed  Descriptions of Associations:Circumstantial  Orientation:Partial  Thought Content:Scattered  History of Schizophrenia/Schizoaffective disorder:Yes  Duration of Psychotic Symptoms:Greater than six months  Hallucinations:No data recorded Ideas of Reference:None  Suicidal Thoughts:No data recorded Homicidal Thoughts:No data recorded  Sensorium  Memory: Remote Good  Judgment: Fair  Insight: Fair   Materials engineer: Fair  Attention Span: Fair  Recall: [place] of Knowledge: Fair  Language: Fair   Psychomotor Activity  Psychomotor Activity:No data recorded  Assets  Assets:No data recorded  Sleep  Sleep:No data recorded    Blood pressure 129/81, pulse (!) 102, temperature 98.7 F (37.1 C), temperature source Oral, resp. rate 18, height 5\' 8"  (1.727 m), weight 85.8 kg, SpO2 100 %. Body mass index is 28.76 kg/m.   Treatment Plan Summary: Daily contact with patient to assess and evaluate symptoms and progress in treatment, Medication management, and Plan continue current medications.  [name], DO 09/16/2022, 11:21 AM

## 2022-09-16 NOTE — Plan of Care (Signed)
PT irritable, compliant with medications, refused AM vitals.  Pt behavior is at baseline early in the shift but became increasingly irritable at times throughout the day. Continued to monitor via q 15 minute observations.

## 2022-09-17 NOTE — Progress Notes (Signed)
Patient very irritable and hollering at the hallway. Refused medications and walked off from the medication room first. Later came and took medications. Patient at nurses stations for multiple reasons. Appetite and energy level good. Support and encouragement given.

## 2022-09-17 NOTE — Progress Notes (Signed)
Recreation Therapy Notes   Date: 09/17/2022  Time: 10:45 am   Location: Craft room       Behavioral response: N/A   Intervention Topic: Values   Discussion/Intervention: Patient refused to attend group.   Clinical Observations/Feedback:  Patient refused to attend group.    Yania Bogie LRT/CTRS        Avid Guillette 09/17/2022 11:27 AM

## 2022-09-17 NOTE — Progress Notes (Signed)
[facility] MD Progress Note  09/17/2022 5:41 PM Adam [name]  MRN:  [id number] Subjective: Follow-up patient with schizophrenia.  No change whatsoever to condition Principal Problem: Schizophrenia, chronic condition with acute exacerbation (HCC) Diagnosis: Principal Problem:   Schizophrenia, chronic condition with acute exacerbation ([place])  Total Time spent with patient: 30 minutes  Past Psychiatric History: Past history of schizophrenia  Past Medical History:  Past Medical History:  Diagnosis Date   Myocardial infarction ([place])    Schizophrenia ([place])    Stroke ([place])    History reviewed. No pertinent surgical history. Family History: History reviewed. No pertinent family history. Family Psychiatric  History: See previous Social History:  Social History   Substance and Sexual Activity  Alcohol Use Not Currently     Social History   Substance and Sexual Activity  Drug Use Not Currently    Social History   Socioeconomic History   Marital status: Single    Spouse name: Not on file   Number of children: Not on file   Years of education: Not on file   Highest education level: Not on file  Occupational History   Not on file  Tobacco Use   Smoking status: Never    Passive exposure: Never   Smokeless tobacco: Never  Vaping Use   Vaping Use: Unknown  Substance and Sexual Activity   Alcohol use: Not Currently   Drug use: Not Currently   Sexual activity: Not Currently  Other Topics Concern   Not on file  Social History Narrative   Not on file   Social Determinants of Health   Financial Resource Strain: Not on file  Food Insecurity: Not on file  Transportation Needs: Not on file  Physical Activity: Not on file  Stress: Not on file  Social Connections: Not on file   Additional Social History:  Specify valuables returned:  (none)                      Sleep: Fair  Appetite:  Fair  Current Medications: Current Facility-Administered Medications  Medication  Dose Route Frequency Provider Last Rate Last Admin   acetaminophen (TYLENOL) tablet 650 mg  650 mg Oral Q6H PRN Sundance Moise T, MD   650 mg at 09/09/22 1813   alum & mag hydroxide-simeth (MAALOX/MYLANTA) 200-200-20 MG/5ML suspension 30 mL  30 mL Oral Q4H PRN Satori Krabill T, MD   30 mL at 04/26/22 1914   aspirin EC tablet 81 mg  81 mg Oral Daily Lionell Matuszak, [name], MD   81 mg at 09/17/22 0946   atorvastatin (LIPITOR) tablet 20 mg  20 mg Oral Daily Adianna Darwin T, MD   20 mg at 09/17/22 0947   atropine 1 % ophthalmic solution 1 drop  1 drop Sublingual QID [name], MD   1 drop at 09/17/22 1656   benztropine (COGENTIN) tablet 0.5 mg  0.5 mg Oral BID Orvill Coulthard T, MD   0.5 mg at 09/17/22 1656   clonazePAM (KLONOPIN) tablet 1 mg  1 mg Oral TID PRN He, [name], MD   1 mg at 09/17/22 0949   cloZAPine (CLOZARIL) tablet 300 mg  300 mg Oral QHS Bianey Tesoro T, MD   300 mg at 09/16/22 2045   diphenhydrAMINE (BENADRYL) capsule 50 mg  50 mg Oral Q6H PRN Rebbie Lauricella T, MD   50 mg at 09/17/22 1532   Or   diphenhydrAMINE (BENADRYL) injection 50 mg  50 mg Intramuscular Q6H PRN Derrich Gaby, [name], MD  feeding supplement (ENSURE ENLIVE / ENSURE PLUS) liquid 237 mL  237 mL Oral TID BM Emon Miggins T, MD   237 mL at 09/17/22 1657   gabapentin (NEURONTIN) capsule 300 mg  300 mg Oral TID Luis Sami T, MD   300 mg at 09/17/22 1656   haloperidol (HALDOL) tablet 2 mg  2 mg Oral TID Donat Humble T, MD   2 mg at 09/17/22 1656   haloperidol (HALDOL) tablet 5 mg  5 mg Oral Q6H PRN Makela Niehoff T, MD   5 mg at 09/17/22 1532   Or   haloperidol lactate (HALDOL) injection 5 mg  5 mg Intramuscular Q6H PRN Kay Ricciuti T, MD       lisinopril (ZESTRIL) tablet 5 mg  5 mg Oral Daily Julizza Sassone T, MD   5 mg at 09/17/22 0947   magnesium hydroxide (MILK OF MAGNESIA) suspension 30 mL  30 mL Oral Daily PRN Lashonna Rieke T, MD   30 mL at 09/15/22 1047   metoprolol succinate (TOPROL-XL) 24 hr tablet 25 mg  25 mg Oral Daily  Rafel Garde T, MD   25 mg at 09/17/22 0947   ondansetron (ZOFRAN) tablet 4 mg  4 mg Oral Q8H PRN Lerry Liner M, NP   4 mg at 09/06/22 2320   paliperidone (INVEGA SUSTENNA) injection 156 mg  156 mg Intramuscular Q28 days Hatem Cull, [name], MD   156 mg at 09/13/22 0240   senna-docusate (Senokot-S) tablet 2 tablet  2 tablet Oral QHS PRN Sarina Ill, DO   2 tablet at 09/15/22 2110   temazepam (RESTORIL) capsule 15 mg  15 mg Oral QHS Mataeo Ingwersen T, MD   15 mg at 09/16/22 2044   ziprasidone (GEODON) injection 20 mg  20 mg Intramuscular Q12H PRN Nivek Powley, [name], MD   20 mg at 07/08/22 1453    Lab Results: No results found for this or any previous visit (from the past 48 hour(s)).  Blood Alcohol level:  Lab Results  Component Value Date   ETH <10 07/20/2021    Metabolic Disorder Labs: Lab Results  Component Value Date   HGBA1C 5.5 04/10/2022   MPG 111.15 04/10/2022   No results found for: "PROLACTIN" Lab Results  Component Value Date   CHOL 167 11/20/2021   TRIG 107 11/20/2021   HDL 26 (L) 11/20/2021   CHOLHDL 6.4 11/20/2021   VLDL 21 11/20/2021   LDLCALC 120 (H) 11/20/2021    Physical Findings: AIMS: Facial and Oral Movements Muscles of Facial Expression: None, normal Lips and Perioral Area: None, normal Jaw: None, normal Tongue: None, normal,Extremity Movements Upper (arms, wrists, hands, fingers): None, normal Lower (legs, knees, ankles, toes): None, normal, Trunk Movements Neck, shoulders, hips: None, normal, Overall Severity Severity of abnormal movements (highest score from questions above): None, normal Incapacitation due to abnormal movements: None, normal Patient's awareness of abnormal movements (rate only patient's report): No Awareness, Dental Status Current problems with teeth and/or dentures?: No Does patient usually wear dentures?: No  CIWA:    COWS:     Musculoskeletal: Strength & Muscle Tone: within normal limits Gait & Station:  normal Patient leans: N/A  Psychiatric Specialty Exam:  Presentation  General Appearance:  Appropriate for Environment; Casual; Neat; Well Groomed  Eye Contact: Fair  Speech: Slow  Speech Volume: Decreased  Handedness: Left   Mood and Affect  Mood: Anxious  Affect: Congruent   Thought Process  Thought Processes: Goal Directed  Descriptions of Associations:Circumstantial  Orientation:Partial  Thought Content:Scattered  History of Schizophrenia/Schizoaffective disorder:Yes  Duration of Psychotic Symptoms:Greater than six months  Hallucinations:No data recorded Ideas of Reference:None  Suicidal Thoughts:No data recorded Homicidal Thoughts:No data recorded  Sensorium  Memory: Remote Good  Judgment: Fair  Insight: Fair   Chartered certified accountant: Fair  Attention Span: Fair  Recall: [employer] of Knowledge: Fair  Language: Fair   Psychomotor Activity  Psychomotor Activity:No data recorded  Assets  Assets:No data recorded  Sleep  Sleep:No data recorded   Physical Exam: Physical Exam Vitals and nursing note reviewed.  Constitutional:      Appearance: Normal appearance.  HENT:     Head: Normocephalic and atraumatic.     Mouth/Throat:     Pharynx: Oropharynx is clear.  Eyes:     Pupils: Pupils are equal, round, and reactive to light.  Cardiovascular:     Rate and Rhythm: Normal rate and regular rhythm.  Pulmonary:     Effort: Pulmonary effort is normal.     Breath sounds: Normal breath sounds.  Abdominal:     General: Abdomen is flat.     Palpations: Abdomen is soft.  Musculoskeletal:        General: Normal range of motion.  Skin:    General: Skin is warm and dry.  Neurological:     General: No focal deficit present.     Mental Status: He is alert. Mental status is at baseline.  Psychiatric:        Mood and Affect: Mood normal.        Thought Content: Thought content normal.    Review of Systems   Unable to perform ROS: Language   Blood pressure (!) 125/91, pulse (!) 108, temperature 97.7 F (36.5 C), temperature source Oral, resp. rate 16, height 5\' 8"  (1.727 m), weight 85.8 kg, SpO2 100 %. Body mass index is 28.76 kg/m.   Treatment Plan Summary: Medication management and Plan no change to medication management.  Still working on placement  , MD 09/17/2022, 5:41 PM

## 2022-09-17 NOTE — Progress Notes (Signed)
Patient calm and pleasant during assessment denying SI/HI/AVH. Pt observed interacting appropriately with staff and peers on the unit. Pt compliant with medication administration per MD orders. Pt given education, support, and encouragement to be active in his treatment plan. Pt being monitored Q 15 minutes for safety per unit protocol, remains safe on the unit  

## 2022-09-17 NOTE — Progress Notes (Signed)
Patient irritable at beginning of shift. Pleasant and cooperative later. Compliant with medications. Remains safe on unit with q 15 min checks.

## 2022-09-17 NOTE — Plan of Care (Signed)
  Problem: Coping: Goal: Ability to identify and develop effective coping behavior will improve Outcome: Progressing   Problem: Coping: Goal: Coping ability will improve 09/17/2022 0430 by Jacinto Halim, RN Outcome: Progressing 09/16/2022 2104 by Jacinto Halim, RN Outcome: Progressing   Problem: Coping: Goal: Level of anxiety will decrease Outcome: Progressing

## 2022-09-18 LAB — CBC WITH DIFFERENTIAL/PLATELET
Abs Immature Granulocytes: 0.01 10*3/uL (ref 0.00–0.07)
Basophils Absolute: 0.1 10*3/uL (ref 0.0–0.1)
Basophils Relative: 2 %
Eosinophils Absolute: 0 10*3/uL (ref 0.0–0.5)
Eosinophils Relative: 0 %
HCT: 38.1 % — ABNORMAL LOW (ref 39.0–52.0)
Hemoglobin: 13.4 g/dL (ref 13.0–17.0)
Immature Granulocytes: 0 %
Lymphocytes Relative: 20 %
Lymphs Abs: 1.1 10*3/uL (ref 0.7–4.0)
MCH: 30.8 pg (ref 26.0–34.0)
MCHC: 35.2 g/dL (ref 30.0–36.0)
MCV: 87.6 fL (ref 80.0–100.0)
Monocytes Absolute: 0.5 10*3/uL (ref 0.1–1.0)
Monocytes Relative: 9 %
Neutro Abs: 3.8 10*3/uL (ref 1.7–7.7)
Neutrophils Relative %: 69 %
Platelets: 159 10*3/uL (ref 150–400)
RBC: 4.35 MIL/uL (ref 4.22–5.81)
RDW: 13.7 % (ref 11.5–15.5)
WBC: 5.5 10*3/uL (ref 4.0–10.5)
nRBC: 0 % (ref 0.0–0.2)

## 2022-09-18 NOTE — Progress Notes (Signed)
Patient came to the nurses station pointing to his face and then the supply closet. This writer knows from previous encounters that patient wants to shave. This writer shaved patient's face, in which he tolerated it well without any issues. Patient remains safe on the unit.

## 2022-09-18 NOTE — Progress Notes (Signed)
Patient calm and pleasant during assessment denying SI/HI/AVH. Pt observed interacting appropriately with staff and peers on the unit. Pt compliant with medication administration per MD orders. Pt given education, support, and encouragement to be active in his treatment plan. Pt being monitored Q 15 minutes for safety per unit protocol, remains safe on the unit  

## 2022-09-18 NOTE — Progress Notes (Signed)
MD notified of patient's low BP status this morning, as well as the updated BP 106/68. MD stated that he would like for this writer to administer BP medication with scheduled noon meds.

## 2022-09-18 NOTE — Plan of Care (Signed)
D- Patient alert and oriented to person, place, and situation. Patient presented in a preoccupied, but pleasant mood on assessment. Patient is fixated on getting his toe nails painted. Patient is childlike due to a past stroke and has expressive aphasia. Patient has been saying [employer], [employer], [employer] and guitars multiple times throughout the day. Patient denied SI, HI, AVH, and pain to this Probation officer. Patient also denied any signs/symptoms of depression/anxiety. Patient had no stated goals for today.  A- Scheduled medications administered to patient, per MD orders. Support and encouragement provided.  Routine safety checks conducted every 15 minutes.  Patient informed to notify staff with problems or concerns.  R- No adverse drug reactions noted. Patient contracts for safety at this time. Patient compliant with medications. Patient receptive, calm, and cooperative. Patient remains safe at this time.  Problem: Education: Goal: Ability to state activities that reduce stress will improve Outcome: Progressing   Problem: Coping: Goal: Ability to identify and develop effective coping behavior will improve Outcome: Progressing   Problem: Self-Concept: Goal: Ability to identify factors that promote anxiety will improve Outcome: Progressing Goal: Level of anxiety will decrease Outcome: Progressing Goal: Ability to modify response to factors that promote anxiety will improve Outcome: Progressing   Problem: Education: Goal: Knowledge of General Education information will improve Description: Including pain rating scale, medication(s)/side effects and non-pharmacologic comfort measures Outcome: Progressing   Problem: Health Behavior/Discharge Planning: Goal: Ability to manage health-related needs will improve Outcome: Progressing   Problem: Clinical Measurements: Goal: Ability to maintain clinical measurements within normal limits will improve Outcome: Progressing Goal: Will  remain free from infection Outcome: Progressing Goal: Diagnostic test results will improve Outcome: Progressing Goal: Respiratory complications will improve Outcome: Progressing Goal: Cardiovascular complication will be avoided Outcome: Progressing   Problem: Activity: Goal: Risk for activity intolerance will decrease Outcome: Progressing   Problem: Nutrition: Goal: Adequate nutrition will be maintained Outcome: Progressing   Problem: Coping: Goal: Level of anxiety will decrease Outcome: Progressing   Problem: Elimination: Goal: Will not experience complications related to bowel motility Outcome: Progressing Goal: Will not experience complications related to urinary retention Outcome: Progressing   Problem: Pain Managment: Goal: General experience of comfort will improve Outcome: Progressing   Problem: Safety: Goal: Ability to remain free from injury will improve Outcome: Progressing   Problem: Skin Integrity: Goal: Risk for impaired skin integrity will decrease Outcome: Progressing   Problem: Activity: Goal: Interest or engagement in leisure activities will improve Outcome: Progressing   Problem: Coping: Goal: Coping ability will improve Outcome: Progressing   Problem: Education: Goal: Will be free of psychotic symptoms Outcome: Progressing

## 2022-09-18 NOTE — Progress Notes (Signed)
Patient's BP was low at 71/50. This Probation officer held patient's BP medication due to order parameters not being met. MD will be notified during progression rounds. Patient was given a Gatorade and also encouraged to increase fluids. Staff will continue to monitor.

## 2022-09-18 NOTE — Progress Notes (Signed)
[facility] MD Progress Note  09/18/2022 4:11 PM Adam [name]  MRN:  [id number] Subjective: Patient is stable no complaints.  No change in behavior. Principal Problem: Schizophrenia, chronic condition with acute exacerbation (HCC) Diagnosis: Principal Problem:   Schizophrenia, chronic condition with acute exacerbation (HCC)  Total Time spent with patient: 15 minutes  Past Psychiatric History: Schizophrenia  Past Medical History:  Past Medical History:  Diagnosis Date   Myocardial infarction (HCC)    Schizophrenia (HCC)    Stroke (HCC)    History reviewed. No pertinent surgical history. Family History: History reviewed. No pertinent family history. Family Psychiatric  History: Schizophrenia Social History:  Social History   Substance and Sexual Activity  Alcohol Use Not Currently     Social History   Substance and Sexual Activity  Drug Use Not Currently    Social History   Socioeconomic History   Marital status: Single    Spouse name: Not on file   Number of children: Not on file   Years of education: Not on file   Highest education level: Not on file  Occupational History   Not on file  Tobacco Use   Smoking status: Never    Passive exposure: Never   Smokeless tobacco: Never  Vaping Use   Vaping Use: Unknown  Substance and Sexual Activity   Alcohol use: Not Currently   Drug use: Not Currently   Sexual activity: Not Currently  Other Topics Concern   Not on file  Social History Narrative   Not on file   Social Determinants of Health   Financial Resource Strain: Not on file  Food Insecurity: Not on file  Transportation Needs: Not on file  Physical Activity: Not on file  Stress: Not on file  Social Connections: Not on file   Additional Social History:  Specify valuables returned:  (none)                      Sleep: Fair  Appetite:  Fair  Current Medications: Current Facility-Administered Medications  Medication Dose Route Frequency Provider  Last Rate Last Admin   acetaminophen (TYLENOL) tablet 650 mg  650 mg Oral Q6H PRN Tyrah Broers T, MD   650 mg at 09/09/22 1813   alum & mag hydroxide-simeth (MAALOX/MYLANTA) 200-200-20 MG/5ML suspension 30 mL  30 mL Oral Q4H PRN Graycee Greeson T, MD   30 mL at 04/26/22 4481   aspirin EC tablet 81 mg  81 mg Oral Daily Kinser Fellman, [name], MD   81 mg at 09/18/22 0802   atorvastatin (LIPITOR) tablet 20 mg  20 mg Oral Daily Kyira Volkert T, MD   20 mg at 09/18/22 0802   atropine 1 % ophthalmic solution 1 drop  1 drop Sublingual QID [name], MD   1 drop at 09/18/22 1200   benztropine (COGENTIN) tablet 0.5 mg  0.5 mg Oral BID Collette Pescador T, MD   0.5 mg at 09/18/22 0802   clonazePAM (KLONOPIN) tablet 1 mg  1 mg Oral TID PRN He, [name], MD   1 mg at 09/17/22 0949   cloZAPine (CLOZARIL) tablet 300 mg  300 mg Oral QHS Alesi Zachery T, MD   300 mg at 09/17/22 2114   diphenhydrAMINE (BENADRYL) capsule 50 mg  50 mg Oral Q6H PRN Karishma Unrein, [name], MD   50 mg at 09/17/22 1532   Or   diphenhydrAMINE (BENADRYL) injection 50 mg  50 mg Intramuscular Q6H PRN Belva Koziel, [name], MD  feeding supplement (ENSURE ENLIVE / ENSURE PLUS) liquid 237 mL  237 mL Oral TID BM Jaliya Siegmann T, MD   237 mL at 09/18/22 1528   gabapentin (NEURONTIN) capsule 300 mg  300 mg Oral TID Petar Mucci T, MD   300 mg at 09/18/22 1200   haloperidol (HALDOL) tablet 2 mg  2 mg Oral TID Jony Ladnier, [name], MD   2 mg at 09/18/22 1200   haloperidol (HALDOL) tablet 5 mg  5 mg Oral Q6H PRN Tinslee Klare, [name], MD   5 mg at 09/17/22 1532   Or   haloperidol lactate (HALDOL) injection 5 mg  5 mg Intramuscular Q6H PRN Deirdre Gryder, [name], MD       lisinopril (ZESTRIL) tablet 5 mg  5 mg Oral Daily Ashantae Pangallo T, MD   5 mg at 09/18/22 1200   magnesium hydroxide (MILK OF MAGNESIA) suspension 30 mL  30 mL Oral Daily PRN Nyx Keady T, MD   30 mL at 09/15/22 1047   metoprolol succinate (TOPROL-XL) 24 hr tablet 25 mg  25 mg Oral Daily Zhion Pevehouse T, MD   25 mg at  09/18/22 1200   ondansetron (ZOFRAN) tablet 4 mg  4 mg Oral Q8H PRN Deloria Lair, NP   4 mg at 09/06/22 2320   paliperidone (INVEGA SUSTENNA) injection 156 mg  156 mg Intramuscular Q28 days Raevin Wierenga, [name], MD   156 mg at 09/13/22 4034   senna-docusate (Senokot-S) tablet 2 tablet  2 tablet Oral QHS PRN [name], DO   2 tablet at 09/15/22 2110   temazepam (RESTORIL) capsule 15 mg  15 mg Oral QHS Nanna Ertle T, MD   15 mg at 09/17/22 2114   ziprasidone (GEODON) injection 20 mg  20 mg Intramuscular Q12H PRN Benjimen Kelley, [name], MD   20 mg at 07/08/22 1453    Lab Results:  Results for orders placed or performed during the hospital encounter of 03/19/22 (from the past 48 hour(s))  CBC with Differential/Platelet     Status: Abnormal   Collection Time: 09/18/22 10:00 AM  Result Value Ref Range   WBC 5.5 4.0 - 10.5 K/uL   RBC 4.35 4.22 - 5.81 MIL/uL   Hemoglobin 13.4 13.0 - 17.0 g/dL   HCT 38.1 (L) 39.0 - 52.0 %   MCV 87.6 80.0 - 100.0 fL   MCH 30.8 26.0 - 34.0 pg   MCHC 35.2 30.0 - 36.0 g/dL   RDW 13.7 11.5 - 15.5 %   Platelets 159 150 - 400 K/uL   nRBC 0.0 0.0 - 0.2 %   Neutrophils Relative % 69 %   Neutro Abs 3.8 1.7 - 7.7 K/uL   Lymphocytes Relative 20 %   Lymphs Abs 1.1 0.7 - 4.0 K/uL   Monocytes Relative 9 %   Monocytes Absolute 0.5 0.1 - 1.0 K/uL   Eosinophils Relative 0 %   Eosinophils Absolute 0.0 0.0 - 0.5 K/uL   Basophils Relative 2 %   Basophils Absolute 0.1 0.0 - 0.1 K/uL   Immature Granulocytes 0 %   Abs Immature Granulocytes 0.01 0.00 - 0.07 K/uL    Comment: Performed at [facility], [address] [place], [place] [address]    Blood Alcohol level:  Lab Results  Component Value Date   [facility] <10 56/38/7564    Metabolic Disorder Labs: Lab Results  Component Value Date   HGBA1C 5.5 04/10/2022   MPG 111.15 04/10/2022   No results found for: "PROLACTIN"  Lab Results  Component Value Date   CHOL 167 11/20/2021   TRIG 107 11/20/2021    HDL 26 (L) 11/20/2021   CHOLHDL 6.4 11/20/2021   VLDL 21 11/20/2021   LDLCALC 120 (H) 11/20/2021    Physical Findings: AIMS: Facial and Oral Movements Muscles of Facial Expression: None, normal Lips and Perioral Area: None, normal Jaw: None, normal Tongue: None, normal,Extremity Movements Upper (arms, wrists, hands, fingers): None, normal Lower (legs, knees, ankles, toes): None, normal, Trunk Movements Neck, shoulders, hips: None, normal, Overall Severity Severity of abnormal movements (highest score from questions above): None, normal Incapacitation due to abnormal movements: None, normal Patient's awareness of abnormal movements (rate only patient's report): No Awareness, Dental Status Current problems with teeth and/or dentures?: No Does patient usually wear dentures?: No  CIWA:    COWS:     Musculoskeletal: Strength & Muscle Tone: within normal limits Gait & Station: normal Patient leans: N/A  Psychiatric Specialty Exam:  Presentation  General Appearance:  Appropriate for Environment; Casual; Neat; Well Groomed  Eye Contact: Fair  Speech: Slow  Speech Volume: Decreased  Handedness: Left   Mood and Affect  Mood: Anxious  Affect: Congruent   Thought Process  Thought Processes: Goal Directed  Descriptions of Associations:Circumstantial  Orientation:Partial  Thought Content:Scattered  History of Schizophrenia/Schizoaffective disorder:Yes  Duration of Psychotic Symptoms:Greater than six months  Hallucinations:No data recorded Ideas of Reference:None  Suicidal Thoughts:No data recorded Homicidal Thoughts:No data recorded  Sensorium  Memory: Remote Good  Judgment: Fair  Insight: Fair   Art therapist  Concentration: Fair  Attention Span: Fair  Recall: [employer] of Knowledge: Fair  Language: Fair   Psychomotor Activity  Psychomotor Activity:No data recorded  Assets  Assets:No data recorded  Sleep  Sleep:No  data recorded   Physical Exam: Physical Exam Vitals and nursing note reviewed.  Constitutional:      Appearance: Normal appearance.  HENT:     Head: Normocephalic and atraumatic.     Mouth/Throat:     Pharynx: Oropharynx is clear.  Eyes:     Pupils: Pupils are equal, round, and reactive to light.  Cardiovascular:     Rate and Rhythm: Normal rate and regular rhythm.  Pulmonary:     Effort: Pulmonary effort is normal.     Breath sounds: Normal breath sounds.  Abdominal:     General: Abdomen is flat.     Palpations: Abdomen is soft.  Musculoskeletal:        General: Normal range of motion.  Skin:    General: Skin is warm and dry.  Neurological:     General: No focal deficit present.     Mental Status: He is alert. Mental status is at baseline.  Psychiatric:        Mood and Affect: Mood normal.        Thought Content: Thought content normal.    Review of Systems  Constitutional: Negative.   HENT: Negative.    Eyes: Negative.   Respiratory: Negative.    Cardiovascular: Negative.   Gastrointestinal: Negative.   Musculoskeletal: Negative.   Skin: Negative.   Neurological: Negative.   Psychiatric/Behavioral: Negative.     Blood pressure 106/68, pulse (!) 108, temperature 98.3 F (36.8 C), temperature source Oral, resp. rate 18, height 5\' 8"  (1.727 m), weight 85.8 kg, SpO2 99 %. Body mass index is 28.76 kg/m.   Treatment Plan Summary: Plan no change to treatment plan.  Still waiting on discharge  , MD 09/18/2022, 4:11 PM

## 2022-09-18 NOTE — Progress Notes (Signed)
Recreation Therapy Notes  Date: 09/18/2022  Time: 10:40 am   Location: Craft room       Behavioral response: N/A   Intervention Topic: Time Management     Discussion/Intervention: Patient refused to attend group.   Clinical Observations/Feedback:  Patient refused to attend group.    Kashmere Daywalt LRT/CTRS       Chieko Neises 09/18/2022 12:22 PM        Achille Xiang 09/18/2022 12:22 PM

## 2022-09-18 NOTE — Progress Notes (Signed)
This morning at 8:25 am, Transitions of Care Director contacted [facility] and provided information regarding Mr. Adam [name] payment source for placement. [facility] continues to engage a facility who is considering admission. There is a meeting with [facility] tomorrow where updates will be shared.  At this time, [facility] has what they need from the hospital. [facility] Department will continue to support discharge planning efforts.

## 2022-09-19 NOTE — Plan of Care (Signed)
Patient appropriate with staff & peers. Singing loud and hollering at times. No issues verbalized.Compliant with medications. Appetite and energy level good. Support and encouragement given.

## 2022-09-19 NOTE — Progress Notes (Signed)
Recreation Therapy Notes  Date: 09/19/2022  Time: 10:15 am   Location: Craft room       Behavioral response: N/A   Intervention Topic: Stress Management    Discussion/Intervention: Patient refused to attend group.   Clinical Observations/Feedback:  Patient refused to attend group.    Dameir Gentzler LRT/CTRS        Deyani Hegarty 09/19/2022 11:59 AM

## 2022-09-19 NOTE — Progress Notes (Signed)
Pharmacy - Clozapine     This patient's order has been reviewed for prescribing contraindications.   Clozapine REMS enrollment Verified: yes REMS number: [id number] Enrollment date: 01/04/2022 Current Outpatient Monitoring: every 2 weeks  Dose Adjustments This Admission: Clozapine 300mg  qhs since 03/19/2022  Labs:  7/26 :  [facility] 2500 K/uL 8/02:   [place] 2200 8/21:   [place] 2500 9/19:   [place] 2451 10/3:   [place] 3100 10/17: [place] 3800    Plan: Lab submitted to clozapine REMS Continue [place] labs every 2 weeks while inpatient   Pearla Dubonnet, PharmD Clinical Pharmacist 09/19/2022 10:22 AM

## 2022-09-19 NOTE — Progress Notes (Signed)
[facility] Supervisor participated on a call with [facility] regarding an update on placement for the patient. After reviewing the FL 2, the team at [facility] indicated a structured/independent living home (to include continuous patient monitoring and wraparound services) would be most appropriate for the patient (instead of a family care home). Kassie Mends [phone]) with [facility] has requested to have a virtual interview with the patient.   TOC unit clinician will follow-up with Ms. Adam [name] to schedule the interview over the next week.     Boris Lown, LCSW, ACSW

## 2022-09-19 NOTE — Progress Notes (Signed)
[facility] MD Progress Note  09/19/2022 5:34 PM Adam [name]  MRN:  [id number] Subjective: No new complaints.  No change Principal Problem: Schizophrenia, chronic condition with acute exacerbation (HCC) Diagnosis: Principal Problem:   Schizophrenia, chronic condition with acute exacerbation ([place])  Total Time spent with patient: 20 minutes  Past Psychiatric History: Past history of schizophrenia  Past Medical History:  Past Medical History:  Diagnosis Date   Myocardial infarction ([place])    Schizophrenia ([place])    Stroke ([place])    History reviewed. No pertinent surgical history. Family History: History reviewed. No pertinent family history. Family Psychiatric  History: See previous Social History:  Social History   Substance and Sexual Activity  Alcohol Use Not Currently     Social History   Substance and Sexual Activity  Drug Use Not Currently    Social History   Socioeconomic History   Marital status: Single    Spouse name: Not on file   Number of children: Not on file   Years of education: Not on file   Highest education level: Not on file  Occupational History   Not on file  Tobacco Use   Smoking status: Never    Passive exposure: Never   Smokeless tobacco: Never  Vaping Use   Vaping Use: Unknown  Substance and Sexual Activity   Alcohol use: Not Currently   Drug use: Not Currently   Sexual activity: Not Currently  Other Topics Concern   Not on file  Social History Narrative   Not on file   Social Determinants of Health   Financial Resource Strain: Not on file  Food Insecurity: Not on file  Transportation Needs: Not on file  Physical Activity: Not on file  Stress: Not on file  Social Connections: Not on file   Additional Social History:  Specify valuables returned:  (none)                      Sleep: Fair  Appetite:  Fair  Current Medications: Current Facility-Administered Medications  Medication Dose Route Frequency Provider Last Rate Last  Admin   acetaminophen (TYLENOL) tablet 650 mg  650 mg Oral Q6H PRN Jerry Clyne T, MD   650 mg at 09/09/22 1813   alum & mag hydroxide-simeth (MAALOX/MYLANTA) 200-200-20 MG/5ML suspension 30 mL  30 mL Oral Q4H PRN Taejon Irani T, MD   30 mL at 04/26/22 4696   aspirin EC tablet 81 mg  81 mg Oral Daily Savi Lastinger T, MD   81 mg at 09/19/22 0900   atorvastatin (LIPITOR) tablet 20 mg  20 mg Oral Daily Asja Frommer T, MD   20 mg at 09/19/22 0900   atropine 1 % ophthalmic solution 1 drop  1 drop Sublingual QID [name], MD   1 drop at 09/19/22 1701   benztropine (COGENTIN) tablet 0.5 mg  0.5 mg Oral BID Mylee Falin T, MD   0.5 mg at 09/19/22 1702   clonazePAM (KLONOPIN) tablet 1 mg  1 mg Oral TID PRN He, [name], MD   1 mg at 09/17/22 0949   cloZAPine (CLOZARIL) tablet 300 mg  300 mg Oral QHS Terrius Gentile T, MD   300 mg at 09/18/22 2114   diphenhydrAMINE (BENADRYL) capsule 50 mg  50 mg Oral Q6H PRN Elianna Windom, [name], MD   50 mg at 09/17/22 1532   Or   diphenhydrAMINE (BENADRYL) injection 50 mg  50 mg Intramuscular Q6H PRN Aryana Wonnacott, [name], MD  feeding supplement (ENSURE ENLIVE / ENSURE PLUS) liquid 237 mL  237 mL Oral TID BM Maybree Riling T, MD   237 mL at 09/19/22 1403   gabapentin (NEURONTIN) capsule 300 mg  300 mg Oral TID Ilia Engelbert, [name], MD   300 mg at 09/19/22 1701   haloperidol (HALDOL) tablet 2 mg  2 mg Oral TID Jaykwon Morones, [name], MD   2 mg at 09/19/22 1701   haloperidol (HALDOL) tablet 5 mg  5 mg Oral Q6H PRN Doyle Kunath, [name], MD   5 mg at 09/17/22 1532   Or   haloperidol lactate (HALDOL) injection 5 mg  5 mg Intramuscular Q6H PRN Damiya Sandefur, [name], MD       lisinopril (ZESTRIL) tablet 5 mg  5 mg Oral Daily Shelene Krage T, MD   5 mg at 09/19/22 0900   magnesium hydroxide (MILK OF MAGNESIA) suspension 30 mL  30 mL Oral Daily PRN Xaviera Flaten T, MD   30 mL at 09/15/22 1047   metoprolol succinate (TOPROL-XL) 24 hr tablet 25 mg  25 mg Oral Daily Mataeo Ingwersen T, MD   25 mg at 09/19/22 0900    ondansetron (ZOFRAN) tablet 4 mg  4 mg Oral Q8H PRN Jearld Lesch, NP   4 mg at 09/06/22 2320   paliperidone (INVEGA SUSTENNA) injection 156 mg  156 mg Intramuscular Q28 days Osmond Steckman, [name], MD   156 mg at 09/13/22 2229   senna-docusate (Senokot-S) tablet 2 tablet  2 tablet Oral QHS PRN Sarina Ill, DO   2 tablet at 09/15/22 2110   temazepam (RESTORIL) capsule 15 mg  15 mg Oral QHS Di Jasmer T, MD   15 mg at 09/18/22 2114   ziprasidone (GEODON) injection 20 mg  20 mg Intramuscular Q12H PRN Lewis Keats, [name], MD   20 mg at 07/08/22 1453    Lab Results:  Results for orders placed or performed during the hospital encounter of 03/19/22 (from the past 48 hour(s))  CBC with Differential/Platelet     Status: Abnormal   Collection Time: 09/18/22 10:00 AM  Result Value Ref Range   WBC 5.5 4.0 - 10.5 K/uL   RBC 4.35 4.22 - 5.81 MIL/uL   Hemoglobin 13.4 13.0 - 17.0 g/dL   HCT 79.8 (L) 92.1 - 19.4 %   MCV 87.6 80.0 - 100.0 fL   MCH 30.8 26.0 - 34.0 pg   MCHC 35.2 30.0 - 36.0 g/dL   RDW 17.4 08.1 - 44.8 %   Platelets 159 150 - 400 K/uL   nRBC 0.0 0.0 - 0.2 %   Neutrophils Relative % 69 %   Neutro Abs 3.8 1.7 - 7.7 K/uL   Lymphocytes Relative 20 %   Lymphs Abs 1.1 0.7 - 4.0 K/uL   Monocytes Relative 9 %   Monocytes Absolute 0.5 0.1 - 1.0 K/uL   Eosinophils Relative 0 %   Eosinophils Absolute 0.0 0.0 - 0.5 K/uL   Basophils Relative 2 %   Basophils Absolute 0.1 0.0 - 0.1 K/uL   Immature Granulocytes 0 %   Abs Immature Granulocytes 0.01 0.00 - 0.07 K/uL    Comment: Performed at [facility], [address] [place], [place] [address]    Blood Alcohol level:  Lab Results  Component Value Date   [facility] <10 07/20/2021    Metabolic Disorder Labs: Lab Results  Component Value Date   HGBA1C 5.5 04/10/2022   MPG 111.15 04/10/2022   No results found for: "PROLACTIN"  Lab Results  Component Value Date   CHOL 167 11/20/2021   TRIG 107 11/20/2021   HDL 26 (L)  11/20/2021   CHOLHDL 6.4 11/20/2021   VLDL 21 11/20/2021   LDLCALC 120 (H) 11/20/2021    Physical Findings: AIMS: Facial and Oral Movements Muscles of Facial Expression: None, normal Lips and Perioral Area: None, normal Jaw: None, normal Tongue: None, normal,Extremity Movements Upper (arms, wrists, hands, fingers): None, normal Lower (legs, knees, ankles, toes): None, normal, Trunk Movements Neck, shoulders, hips: None, normal, Overall Severity Severity of abnormal movements (highest score from questions above): None, normal Incapacitation due to abnormal movements: None, normal Patient's awareness of abnormal movements (rate only patient's report): No Awareness, Dental Status Current problems with teeth and/or dentures?: No Does patient usually wear dentures?: No  CIWA:    COWS:     Musculoskeletal: Strength & Muscle Tone: within normal limits Gait & Station: normal Patient leans: N/A  Psychiatric Specialty Exam:  Presentation  General Appearance:  Appropriate for Environment; Casual; Neat; Well Groomed  Eye Contact: Fair  Speech: Slow  Speech Volume: Decreased  Handedness: Left   Mood and Affect  Mood: Anxious  Affect: Congruent   Thought Process  Thought Processes: Goal Directed  Descriptions of Associations:Circumstantial  Orientation:Partial  Thought Content:Scattered  History of Schizophrenia/Schizoaffective disorder:Yes  Duration of Psychotic Symptoms:Greater than six months  Hallucinations:No data recorded Ideas of Reference:None  Suicidal Thoughts:No data recorded Homicidal Thoughts:No data recorded  Sensorium  Memory: Remote Good  Judgment: Fair  Insight: Fair   Art therapist  Concentration: Fair  Attention Span: Fair  Recall: Fair  Fund of Knowledge: Fair  Language: Fair   Psychomotor Activity  Psychomotor Activity:No data recorded  Assets  Assets:No data recorded  Sleep  Sleep:No data  recorded   Physical Exam: Physical Exam Vitals reviewed.  Constitutional:      Appearance: Normal appearance.  HENT:     Head: Normocephalic and atraumatic.     Mouth/Throat:     Pharynx: Oropharynx is clear.  Eyes:     Pupils: Pupils are equal, round, and reactive to light.  Cardiovascular:     Rate and Rhythm: Normal rate and regular rhythm.  Pulmonary:     Effort: Pulmonary effort is normal.     Breath sounds: Normal breath sounds.  Abdominal:     General: Abdomen is flat.     Palpations: Abdomen is soft.  Musculoskeletal:        General: Normal range of motion.  Skin:    General: Skin is warm and dry.  Neurological:     General: No focal deficit present.     Mental Status: He is alert. Mental status is at baseline.  Psychiatric:        Attention and Perception: He is inattentive.    Review of Systems  Unable to perform ROS: Language   Blood pressure 110/80, pulse 93, temperature 98.2 F (36.8 C), temperature source Oral, resp. rate 20, height 5\' 8"  (1.727 m), weight 85.8 kg, SpO2 99 %. Body mass index is 28.76 kg/m.   Treatment Plan Summary: Plan no change to treatment plan.  Continue to wait for placement  , MD 09/19/2022, 5:34 PM

## 2022-09-19 NOTE — B[facility] Counselor (Addendum)
CSW spoke with Terri Skains [phone]) to make arrangements for interview with pt. [place] asked if it was ok to do an in-person interview. CSW stated that that would be fine. Staton and CSW discussed schedule. Interview to happen tomorrow but Dewayne Hatch will call CSW in the morning on 09/20/22 to notify of time for the interview. No other concerns expressed. Contact ended without incident.   [name]. Guerry Bruin, MSW, LCSW, [place] 09/19/2022 3:01 PM  ADDENDUM  CSW received call back from [facility]. She informed CSW that she would like to meet with pt tomorrow at 11:30am.   [name]. Guerry Bruin, MSW, LCSW, [place] 09/19/2022 4:14 PM

## 2022-09-20 NOTE — Progress Notes (Signed)
Pt was compliant with medications, denied SI HI AVH.  Continued monitoring for safety via q 15 minute checks.

## 2022-09-20 NOTE — Progress Notes (Addendum)
B[facility]/BMU LCSW Progress Note   09/20/2022    12:37 PM  [name]   [id number]   Type of Contact and Topic:    CSW mediated interview between patient and potential group home placement.   Attendees:  Adam [name] (patient)  Dorita Sciara (group home representative)  Paulla Dolly, [place], LCSW Lyda Kalata, RN   Patient did well answered questions to the best of his ability given limited vocabulary. Patient's mood was pleasant and friendly.   After interview concluded, group home representative states she will staff patient with the owner of the group home. She spoke fondly of the patient and states the patient would fit in nicely with the milieu of the group home.   CSW will follow up with group home at a later time regarding possibility of acceptance.    Signed:  [name], MSW, LCSWA, LCAS 09/20/2022 12:37 PM

## 2022-09-20 NOTE — NC FL2 (Signed)
[place] LEVEL OF CARE SCREENING TOOL     IDENTIFICATION  Patient Name: Adam [name] Birthdate: June 25, 1961 Sex: male Admission Date (Current Location): 03/19/2022  [facility] and [place] Number:  Engineering geologist and Address:  [facility], [address], [place], [place] [address]      Provider Number: [id number]  Attending Physician Name and Address:  Gonzella Lex, MD  Relative Name and Phone Number:  [place], [place] (mother) [phone] (mobile)    Current Level of Care: Hospital Recommended Level of Care: [place], [place], Other (Comment) (Group home) Prior Approval Number:    Date Approved/Denied:   PASRR Number:    Discharge Plan: Other (Comment) (ALF, [place], Group home)    Current Diagnoses: Patient Active Problem List   Diagnosis Date Noted   NSTEMI (non-ST elevated myocardial infarction) ([place]) 11/21/2021   DNR (do not resuscitate)/DNI(Do Not Intubate) 11/20/2021   Schizophrenia, chronic condition with acute exacerbation ([place]) 07/20/2021    Orientation RESPIRATION BLADDER Height & Weight     Self, Situation  Normal Continent Weight: 85.8 kg Height:  5\' 8"  (172.7 cm)  BEHAVIORAL SYMPTOMS/MOOD NEUROLOGICAL BOWEL NUTRITION STATUS  Other (Comment) (Cooperative)  (TBI) Continent Diet (Regular)  AMBULATORY STATUS COMMUNICATION OF NEEDS Skin   Independent Verbally Normal                       Personal Care Assistance Level of Assistance  Bathing, Feeding Bathing Assistance: Limited assistance Feeding assistance: Limited assistance Dressing Assistance: Limited assistance Total Care Assistance: Limited assistance   Functional Limitations Info  Speech Sight Info: Adequate Hearing Info: Adequate Speech Info: Impaired    SPECIAL CARE FACTORS FREQUENCY                       Contractures Contractures Info: Not present    Additional Factors Info  Code Status, Allergies,  Psychotropic Code Status Info: Full code Allergies Info: Morphine and related, Nsaids Psychotropic Info: Schizophrenia         Current Medications (09/20/2022):  This is the current hospital active medication list Current Facility-Administered Medications  Medication Dose Route Frequency Provider Last Rate Last Admin   acetaminophen (TYLENOL) tablet 650 mg  650 mg Oral Q6H PRN [name], [name], MD   650 mg at 09/09/22 1813   alum & mag hydroxide-simeth (MAALOX/MYLANTA) 200-200-20 MG/5ML suspension 30 mL  30 mL Oral Q4H PRN [name], [name], MD   30 mL at 04/26/22 0086   aspirin EC tablet 81 mg  81 mg Oral Daily [name], [name], MD   81 mg at 09/20/22 0817   atorvastatin (LIPITOR) tablet 20 mg  20 mg Oral Daily [name], [name], MD   20 mg at 09/20/22 0817   atropine 1 % ophthalmic solution 1 drop  1 drop Sublingual QID [name], MD   1 drop at 09/20/22 0818   benztropine (COGENTIN) tablet 0.5 mg  0.5 mg Oral BID [name], [name], MD   0.5 mg at 09/20/22 0817   clonazePAM (KLONOPIN) tablet 1 mg  1 mg Oral TID PRN He, [name], MD   1 mg at 09/17/22 0949   cloZAPine (CLOZARIL) tablet 300 mg  300 mg Oral QHS [name], [name], MD   300 mg at 09/19/22 2128   diphenhydrAMINE (BENADRYL) capsule 50 mg  50 mg Oral Q6H PRN [name], [name], MD   50 mg at 09/17/22 1532  Or   diphenhydrAMINE (BENADRYL) injection 50 mg  50 mg Intramuscular Q6H PRN [name], [name], MD       feeding supplement (ENSURE ENLIVE / ENSURE PLUS) liquid 237 mL  237 mL Oral TID BM [name], [name], MD   237 mL at 09/20/22 1100   gabapentin (NEURONTIN) capsule 300 mg  300 mg Oral TID [name], [name], MD   300 mg at 09/20/22 0817   haloperidol (HALDOL) tablet 2 mg  2 mg Oral TID [name], [name], MD   2 mg at 09/20/22 0817   haloperidol (HALDOL) tablet 5 mg  5 mg Oral Q6H PRN [name], [name], MD   5 mg at 09/17/22 1532   Or   haloperidol lactate (HALDOL) injection 5 mg  5 mg Intramuscular Q6H PRN [name], [name], MD       lisinopril  (ZESTRIL) tablet 5 mg  5 mg Oral Daily [name], [name], MD   5 mg at 09/20/22 0817   magnesium hydroxide (MILK OF MAGNESIA) suspension 30 mL  30 mL Oral Daily PRN [name], [name], MD   30 mL at 09/15/22 1047   metoprolol succinate (TOPROL-XL) 24 hr tablet 25 mg  25 mg Oral Daily [name], [name], MD   25 mg at 09/20/22 0817   ondansetron (ZOFRAN) tablet 4 mg  4 mg Oral Q8H PRN Lerry Liner M, NP   4 mg at 09/06/22 2320   paliperidone (INVEGA SUSTENNA) injection 156 mg  156 mg Intramuscular Q28 days [name], [name], MD   156 mg at 09/13/22 7322   senna-docusate (Senokot-S) tablet 2 tablet  2 tablet Oral QHS PRN Sarina Ill, DO   2 tablet at 09/15/22 2110   temazepam (RESTORIL) capsule 15 mg  15 mg Oral QHS [name], [name], MD   15 mg at 09/19/22 2127   ziprasidone (GEODON) injection 20 mg  20 mg Intramuscular Q12H PRN [name], [name], MD   20 mg at 07/08/22 1453     Discharge Medications: Please see discharge summary for a list of discharge medications.  Relevant Imaging Results:  Relevant Lab Results:   Additional Information SS#: [id number]  [place]lenis Smoker, LCSW

## 2022-09-20 NOTE — Plan of Care (Signed)
Pt denies anxiety/depression at this time. Pt denies SI/HI/AVH or pain at this time. Pt is calm and cooperative. Pt is medication compliant. Pt provided with support and encouragement. Pt monitored q15 minutes for safety per unit policy. Plan of care ongoing.    Pt is excited about interview and the possibility of discharging from this facility.   Problem: Self-Concept: Goal: Level of anxiety will decrease Outcome: Progressing Goal: Ability to modify response to factors that promote anxiety will improve Outcome: Progressing

## 2022-09-20 NOTE — Progress Notes (Signed)
[facility] MD Progress Note  09/20/2022 4:25 PM Adam [name]  MRN:  [id number] Subjective: No change to presentation.  No new complaints.  No behavior problems Principal Problem: Schizophrenia, chronic condition with acute exacerbation (HCC) Diagnosis: Principal Problem:   Schizophrenia, chronic condition with acute exacerbation ([place])  Total Time spent with patient: 30 minutes  Past Psychiatric History: Past history of schizophrenia  Past Medical History:  Past Medical History:  Diagnosis Date   Myocardial infarction ([place])    Schizophrenia ([place])    Stroke ([place])    History reviewed. No pertinent surgical history. Family History: History reviewed. No pertinent family history. Family Psychiatric  History: See previous Social History:  Social History   Substance and Sexual Activity  Alcohol Use Not Currently     Social History   Substance and Sexual Activity  Drug Use Not Currently    Social History   Socioeconomic History   Marital status: Single    Spouse name: Not on file   Number of children: Not on file   Years of education: Not on file   Highest education level: Not on file  Occupational History   Not on file  Tobacco Use   Smoking status: Never    Passive exposure: Never   Smokeless tobacco: Never  Vaping Use   Vaping Use: Unknown  Substance and Sexual Activity   Alcohol use: Not Currently   Drug use: Not Currently   Sexual activity: Not Currently  Other Topics Concern   Not on file  Social History Narrative   Not on file   Social Determinants of Health   Financial Resource Strain: Not on file  Food Insecurity: Not on file  Transportation Needs: Not on file  Physical Activity: Not on file  Stress: Not on file  Social Connections: Not on file   Additional Social History:  Specify valuables returned:  (none)                      Sleep: Fair  Appetite:  Fair  Current Medications: Current Facility-Administered Medications  Medication Dose  Route Frequency Provider Last Rate Last Admin   acetaminophen (TYLENOL) tablet 650 mg  650 mg Oral Q6H PRN Galan Ghee T, MD   650 mg at 09/09/22 1813   alum & mag hydroxide-simeth (MAALOX/MYLANTA) 200-200-20 MG/5ML suspension 30 mL  30 mL Oral Q4H PRN Dafney Farler T, MD   30 mL at 04/26/22 6962   aspirin EC tablet 81 mg  81 mg Oral Daily Ashunti Schofield, [name], MD   81 mg at 09/20/22 0817   atorvastatin (LIPITOR) tablet 20 mg  20 mg Oral Daily Derotha Fishbaugh T, MD   20 mg at 09/20/22 0817   atropine 1 % ophthalmic solution 1 drop  1 drop Sublingual QID [name], MD   1 drop at 09/20/22 1608   benztropine (COGENTIN) tablet 0.5 mg  0.5 mg Oral BID Obie Silos T, MD   0.5 mg at 09/20/22 1609   clonazePAM (KLONOPIN) tablet 1 mg  1 mg Oral TID PRN He, [name], MD   1 mg at 09/17/22 0949   cloZAPine (CLOZARIL) tablet 300 mg  300 mg Oral QHS Tabia Landowski T, MD   300 mg at 09/19/22 2128   diphenhydrAMINE (BENADRYL) capsule 50 mg  50 mg Oral Q6H PRN Earnest Mcgillis T, MD   50 mg at 09/17/22 1532   Or   diphenhydrAMINE (BENADRYL) injection 50 mg  50 mg Intramuscular Q6H PRN Pina Sirianni  T, MD       feeding supplement (ENSURE ENLIVE / ENSURE PLUS) liquid 237 mL  237 mL Oral TID BM Yuchen Fedor T, MD   237 mL at 09/20/22 1500   gabapentin (NEURONTIN) capsule 300 mg  300 mg Oral TID Cigi Bega T, MD   300 mg at 09/20/22 1609   haloperidol (HALDOL) tablet 2 mg  2 mg Oral TID Shalini Mair T, MD   2 mg at 09/20/22 1608   haloperidol (HALDOL) tablet 5 mg  5 mg Oral Q6H PRN Shloka Baldridge T, MD   5 mg at 09/17/22 1532   Or   haloperidol lactate (HALDOL) injection 5 mg  5 mg Intramuscular Q6H PRN Clyde Upshaw T, MD       lisinopril (ZESTRIL) tablet 5 mg  5 mg Oral Daily Jayni Prescher T, MD   5 mg at 09/20/22 0817   magnesium hydroxide (MILK OF MAGNESIA) suspension 30 mL  30 mL Oral Daily PRN Kritika Stukes T, MD   30 mL at 09/15/22 1047   metoprolol succinate (TOPROL-XL) 24 hr tablet 25 mg  25 mg Oral Daily  Ailea Rhatigan T, MD   25 mg at 09/20/22 0817   ondansetron (ZOFRAN) tablet 4 mg  4 mg Oral Q8H PRN Lerry Liner M, NP   4 mg at 09/06/22 2320   paliperidone (INVEGA SUSTENNA) injection 156 mg  156 mg Intramuscular Q28 days Breona Cherubin, [name], MD   156 mg at 09/13/22 5462   senna-docusate (Senokot-S) tablet 2 tablet  2 tablet Oral QHS PRN Sarina Ill, DO   2 tablet at 09/15/22 2110   temazepam (RESTORIL) capsule 15 mg  15 mg Oral QHS Indie Boehne T, MD   15 mg at 09/19/22 2127   ziprasidone (GEODON) injection 20 mg  20 mg Intramuscular Q12H PRN Luann Aspinwall, [name], MD   20 mg at 07/08/22 1453    Lab Results: No results found for this or any previous visit (from the past 48 hour(s)).  Blood Alcohol level:  Lab Results  Component Value Date   ETH <10 07/20/2021    Metabolic Disorder Labs: Lab Results  Component Value Date   HGBA1C 5.5 04/10/2022   MPG 111.15 04/10/2022   No results found for: "PROLACTIN" Lab Results  Component Value Date   CHOL 167 11/20/2021   TRIG 107 11/20/2021   HDL 26 (L) 11/20/2021   CHOLHDL 6.4 11/20/2021   VLDL 21 11/20/2021   LDLCALC 120 (H) 11/20/2021    Physical Findings: AIMS: Facial and Oral Movements Muscles of Facial Expression: None, normal Lips and Perioral Area: None, normal Jaw: None, normal Tongue: None, normal,Extremity Movements Upper (arms, wrists, hands, fingers): None, normal Lower (legs, knees, ankles, toes): None, normal, Trunk Movements Neck, shoulders, hips: None, normal, Overall Severity Severity of abnormal movements (highest score from questions above): None, normal Incapacitation due to abnormal movements: None, normal Patient's awareness of abnormal movements (rate only patient's report): No Awareness, Dental Status Current problems with teeth and/or dentures?: No Does patient usually wear dentures?: No  CIWA:    COWS:     Musculoskeletal: Strength & Muscle Tone: within normal limits Gait & Station:  normal Patient leans: N/A  Psychiatric Specialty Exam:  Presentation  General Appearance:  Appropriate for Environment; Casual; Neat; Well Groomed  Eye Contact: Fair  Speech: Slow  Speech Volume: Decreased  Handedness: Left   Mood and Affect  Mood: Anxious  Affect: Congruent   Thought Process  Thought Processes:  Goal Directed  Descriptions of Associations:Circumstantial  Orientation:Partial  Thought Content:Scattered  History of Schizophrenia/Schizoaffective disorder:Yes  Duration of Psychotic Symptoms:Greater than six months  Hallucinations:No data recorded Ideas of Reference:None  Suicidal Thoughts:No data recorded Homicidal Thoughts:No data recorded  Sensorium  Memory: Remote Good  Judgment: Fair  Insight: Fair   Art therapist  Concentration: Fair  Attention Span: Fair  Recall: Fair  Fund of Knowledge: Fair  Language: Fair   Psychomotor Activity  Psychomotor Activity:No data recorded  Assets  Assets:No data recorded  Sleep  Sleep:No data recorded   Physical Exam: Physical Exam Vitals reviewed.  Constitutional:      Appearance: Normal appearance.  HENT:     Head: Normocephalic and atraumatic.     Mouth/Throat:     Pharynx: Oropharynx is clear.  Eyes:     Pupils: Pupils are equal, round, and reactive to light.  Cardiovascular:     Rate and Rhythm: Normal rate and regular rhythm.  Pulmonary:     Effort: Pulmonary effort is normal.     Breath sounds: Normal breath sounds.  Abdominal:     General: Abdomen is flat.     Palpations: Abdomen is soft.  Musculoskeletal:        General: Normal range of motion.  Skin:    General: Skin is warm and dry.  Neurological:     General: No focal deficit present.     Mental Status: He is alert. Mental status is at baseline.    Review of Systems  Unable to perform ROS: Language   Blood pressure 127/86, pulse 99, temperature (!) 97.5 F (36.4 C), temperature  source Oral, resp. rate 18, height 5\' 8"  (1.727 m), weight 85.8 kg, SpO2 99 %. Body mass index is 28.76 kg/m.   Treatment Plan Summary: Medication management and Plan no change to medication.  Supportive counseling.  Only waiting for discharge planning  , MD 09/20/2022, 4:25 PM

## 2022-09-20 NOTE — Plan of Care (Signed)
D- Patient alert and oriented to person, place, and situation. Patient presented in a pleasant mood on assessment, wanting his cell phone, that was approved by leadership to have on the unit. Patient is childlike, due to a past stroke , and has expressive aphasia. Patient does answer questions with a yes or no response, and he also will repeat words that he hears others say at times. Patient denied SI, HI, AVH, and pain at this time. Patient also denied any signs/symptoms of depression/anxiety, stating "no" to this Probation officer. Patient had no stated goals for today.  A- Scheduled medications administered to patient, per MD orders. Support and encouragement provided.  Routine safety checks conducted every 15 minutes.  Patient informed to notify staff with problems or concerns.  R- No adverse drug reactions noted. Patient contracts for safety at this time. Patient compliant with medications. Patient receptive, calm, and cooperative. Patient remains safe at this time.  Problem: Education: Goal: Ability to state activities that reduce stress will improve Outcome: Progressing   Problem: Coping: Goal: Ability to identify and develop effective coping behavior will improve Outcome: Progressing   Problem: Self-Concept: Goal: Ability to identify factors that promote anxiety will improve Outcome: Progressing Goal: Level of anxiety will decrease Outcome: Progressing Goal: Ability to modify response to factors that promote anxiety will improve Outcome: Progressing   Problem: Education: Goal: Knowledge of General Education information will improve Description: Including pain rating scale, medication(s)/side effects and non-pharmacologic comfort measures Outcome: Progressing   Problem: Health Behavior/Discharge Planning: Goal: Ability to manage health-related needs will improve Outcome: Progressing   Problem: Clinical Measurements: Goal: Ability to maintain clinical measurements within normal limits will  improve Outcome: Progressing Goal: Will remain free from infection Outcome: Progressing Goal: Diagnostic test results will improve Outcome: Progressing Goal: Respiratory complications will improve Outcome: Progressing Goal: Cardiovascular complication will be avoided Outcome: Progressing   Problem: Activity: Goal: Risk for activity intolerance will decrease Outcome: Progressing   Problem: Nutrition: Goal: Adequate nutrition will be maintained Outcome: Progressing   Problem: Coping: Goal: Level of anxiety will decrease Outcome: Progressing   Problem: Elimination: Goal: Will not experience complications related to bowel motility Outcome: Progressing Goal: Will not experience complications related to urinary retention Outcome: Progressing   Problem: Pain Managment: Goal: General experience of comfort will improve Outcome: Progressing   Problem: Safety: Goal: Ability to remain free from injury will improve Outcome: Progressing   Problem: Skin Integrity: Goal: Risk for impaired skin integrity will decrease Outcome: Progressing   Problem: Activity: Goal: Interest or engagement in leisure activities will improve Outcome: Progressing   Problem: Coping: Goal: Coping ability will improve Outcome: Progressing   Problem: Education: Goal: Will be free of psychotic symptoms Outcome: Progressing

## 2022-09-21 MED ORDER — GABAPENTIN 300 MG PO CAPS
300.0000 mg | ORAL_CAPSULE | Freq: Three times a day (TID) | ORAL | Status: DC
  Administered 2022-09-21 – 2023-02-19 (×443): 300 mg via ORAL
  Filled 2022-09-21 (×453): qty 1

## 2022-09-21 MED ORDER — HALOPERIDOL 1 MG PO TABS
2.0000 mg | ORAL_TABLET | Freq: Three times a day (TID) | ORAL | Status: DC
  Administered 2022-09-21 – 2023-02-19 (×443): 2 mg via ORAL
  Filled 2022-09-21 (×453): qty 2

## 2022-09-21 NOTE — Progress Notes (Signed)
[facility] MD Progress Note  09/21/2022 2:27 PM Adam [name]  MRN:  [id number] Subjective: Follow-up patient with schizophrenia.  Patient has no complaints.  Clinical presentation is unchanged from his longstanding baseline.  No agitation no violence no problematic behavior at all. Principal Problem: Schizophrenia, chronic condition with acute exacerbation (HCC) Diagnosis: Principal Problem:   Schizophrenia, chronic condition with acute exacerbation (HCC)  Total Time spent with patient: 20 minutes  Past Psychiatric History: Past history of schizophrenia  Past Medical History:  Past Medical History:  Diagnosis Date   Myocardial infarction (HCC)    Schizophrenia (HCC)    Stroke (HCC)    History reviewed. No pertinent surgical history. Family History: History reviewed. No pertinent family history. Family Psychiatric  History: See previous Social History:  Social History   Substance and Sexual Activity  Alcohol Use Not Currently     Social History   Substance and Sexual Activity  Drug Use Not Currently    Social History   Socioeconomic History   Marital status: Single    Spouse name: Not on file   Number of children: Not on file   Years of education: Not on file   Highest education level: Not on file  Occupational History   Not on file  Tobacco Use   Smoking status: Never    Passive exposure: Never   Smokeless tobacco: Never  Vaping Use   Vaping Use: Unknown  Substance and Sexual Activity   Alcohol use: Not Currently   Drug use: Not Currently   Sexual activity: Not Currently  Other Topics Concern   Not on file  Social History Narrative   Not on file   Social Determinants of Health   Financial Resource Strain: Not on file  Food Insecurity: Not on file  Transportation Needs: Not on file  Physical Activity: Not on file  Stress: Not on file  Social Connections: Not on file   Additional Social History:  Specify valuables returned:  (none)                       Sleep: Fair  Appetite:  Fair  Current Medications: Current Facility-Administered Medications  Medication Dose Route Frequency Provider Last Rate Last Admin   acetaminophen (TYLENOL) tablet 650 mg  650 mg Oral Q6H PRN Christian Treadway T, MD   650 mg at 09/09/22 1813   alum & mag hydroxide-simeth (MAALOX/MYLANTA) 200-200-20 MG/5ML suspension 30 mL  30 mL Oral Q4H PRN Jimena Wieczorek T, MD   30 mL at 04/26/22 1324   aspirin EC tablet 81 mg  81 mg Oral Daily Sherrita Riederer T, MD   81 mg at 09/21/22 1006   atorvastatin (LIPITOR) tablet 20 mg  20 mg Oral Daily Stanton Kissoon T, MD   20 mg at 09/21/22 1007   atropine 1 % ophthalmic solution 1 drop  1 drop Sublingual QID [name], MD   1 drop at 09/21/22 1006   benztropine (COGENTIN) tablet 0.5 mg  0.5 mg Oral BID Jaymin Waln T, MD   0.5 mg at 09/21/22 1007   clonazePAM (KLONOPIN) tablet 1 mg  1 mg Oral TID PRN He, [name], MD   1 mg at 09/17/22 0949   cloZAPine (CLOZARIL) tablet 300 mg  300 mg Oral QHS Ermin Parisien T, MD   300 mg at 09/20/22 2102   diphenhydrAMINE (BENADRYL) capsule 50 mg  50 mg Oral Q6H PRN Yuya Vanwingerden, [name], MD   50 mg at 09/17/22 1532  Or   diphenhydrAMINE (BENADRYL) injection 50 mg  50 mg Intramuscular Q6H PRN Umeka Wrench T, MD       feeding supplement (ENSURE ENLIVE / ENSURE PLUS) liquid 237 mL  237 mL Oral TID BM Shaylon Gillean T, MD   237 mL at 09/21/22 1007   gabapentin (NEURONTIN) capsule 300 mg  300 mg Oral TID Trinity Hyland T, MD       haloperidol (HALDOL) tablet 2 mg  2 mg Oral TID Percell Lamboy T, MD       haloperidol (HALDOL) tablet 5 mg  5 mg Oral Q6H PRN Nam Vossler T, MD   5 mg at 09/17/22 1532   Or   haloperidol lactate (HALDOL) injection 5 mg  5 mg Intramuscular Q6H PRN Aksh Swart T, MD       lisinopril (ZESTRIL) tablet 5 mg  5 mg Oral Daily Lily Velasquez T, MD   5 mg at 09/21/22 1006   magnesium hydroxide (MILK OF MAGNESIA) suspension 30 mL  30 mL Oral Daily PRN Silvanna Ohmer T, MD   30 mL at 09/15/22  1047   metoprolol succinate (TOPROL-XL) 24 hr tablet 25 mg  25 mg Oral Daily Len Azeez T, MD   25 mg at 09/21/22 1006   ondansetron (ZOFRAN) tablet 4 mg  4 mg Oral Q8H PRN [name], NP   4 mg at 09/06/22 2320   paliperidone (INVEGA SUSTENNA) injection 156 mg  156 mg Intramuscular Q28 days Shantaya Bluestone, [name], MD   156 mg at 09/13/22 5366   senna-docusate (Senokot-S) tablet 2 tablet  2 tablet Oral QHS PRN [name], DO   2 tablet at 09/15/22 2110   temazepam (RESTORIL) capsule 15 mg  15 mg Oral QHS Afreen Siebels T, MD   15 mg at 09/20/22 2102   ziprasidone (GEODON) injection 20 mg  20 mg Intramuscular Q12H PRN Saiya Crist, [name], MD   20 mg at 07/08/22 1453    Lab Results: No results found for this or any previous visit (from the past 48 hour(s)).  Blood Alcohol level:  Lab Results  Component Value Date   ETH <10 44/01/4741    Metabolic Disorder Labs: Lab Results  Component Value Date   HGBA1C 5.5 04/10/2022   MPG 111.15 04/10/2022   No results found for: "PROLACTIN" Lab Results  Component Value Date   CHOL 167 11/20/2021   TRIG 107 11/20/2021   HDL 26 (L) 11/20/2021   CHOLHDL 6.4 11/20/2021   VLDL 21 11/20/2021   LDLCALC 120 (H) 11/20/2021    Physical Findings: AIMS: Facial and Oral Movements Muscles of Facial Expression: None, normal Lips and Perioral Area: None, normal Jaw: None, normal Tongue: None, normal,Extremity Movements Upper (arms, wrists, hands, fingers): None, normal Lower (legs, knees, ankles, toes): None, normal, Trunk Movements Neck, shoulders, hips: None, normal, Overall Severity Severity of abnormal movements (highest score from questions above): None, normal Incapacitation due to abnormal movements: None, normal Patient's awareness of abnormal movements (rate only patient's report): No Awareness, Dental Status Current problems with teeth and/or dentures?: No Does patient usually wear dentures?: No  CIWA:    COWS:      Musculoskeletal: Strength & Muscle Tone: within normal limits Gait & Station: normal Patient leans: N/A  Psychiatric Specialty Exam:  Presentation  General Appearance:  Appropriate for Environment; Casual; Neat; Well Groomed  Eye Contact: Fair  Speech: Slow  Speech Volume: Decreased  Handedness: Left   Mood and Affect  Mood: Anxious  Affect: Congruent   Thought Process  Thought Processes: Goal Directed  Descriptions of Associations:Circumstantial  Orientation:Partial  Thought Content:Scattered  History of Schizophrenia/Schizoaffective disorder:Yes  Duration of Psychotic Symptoms:Greater than six months  Hallucinations:No data recorded Ideas of Reference:None  Suicidal Thoughts:No data recorded Homicidal Thoughts:No data recorded  Sensorium  Memory: Remote Good  Judgment: Fair  Insight: Fair   Art therapist  Concentration: Fair  Attention Span: Fair  Recall: [employer] of Knowledge: Fair  Language: Fair   Psychomotor Activity  Psychomotor Activity:No data recorded  Assets  Assets:No data recorded  Sleep  Sleep:No data recorded   Physical Exam: Physical Exam Vitals and nursing note reviewed.  Constitutional:      Appearance: Normal appearance.  HENT:     Head: Normocephalic and atraumatic.     Mouth/Throat:     Pharynx: Oropharynx is clear.  Eyes:     Pupils: Pupils are equal, round, and reactive to light.  Cardiovascular:     Rate and Rhythm: Normal rate and regular rhythm.  Pulmonary:     Effort: Pulmonary effort is normal.     Breath sounds: Normal breath sounds.  Abdominal:     General: Abdomen is flat.     Palpations: Abdomen is soft.  Musculoskeletal:        General: Normal range of motion.  Skin:    General: Skin is warm and dry.  Neurological:     General: No focal deficit present.     Mental Status: He is alert. Mental status is at baseline.  Psychiatric:        Attention and  Perception: He is inattentive.        Mood and Affect: Affect is blunt.    Review of Systems  Unable to perform ROS: Language   Blood pressure 114/86, pulse 95, temperature 98.3 F (36.8 C), temperature source Oral, resp. rate 18, height 5\' 8"  (1.727 m), weight 85.8 kg, SpO2 100 %. Body mass index is 28.76 kg/m.   Treatment Plan Summary: Medication management and Plan patient seen and chart reviewed.  No change to presentation.  Possibly worth noticing that this week he has reverted to the delusion that he is pregnant.  This has been a recurrent delusion he has had in the past and never leads to any sort of difficulty or problem and seems to be fairly harmless.  He finds the whole thing sort of amusing.  No change to any treatment.  Still waiting for placement.  , MD 09/21/2022, 2:27 PM

## 2022-09-21 NOTE — Plan of Care (Signed)
D- Patient alert and oriented to patient, person, and situation. Patient presented in a pleasant mood on assessment stating that he slept "good" last night and had no complaints to voice to this Probation officer. Patient is childlike, due to a past stroke , and has expressive aphasia. Patient does answer questions with a yes or no response, and he also will repeat words that he hears others say at times.  Patient requested his cell phone, that was approved by leadership to have on the unit, so that he could listen to [employer]. Patient denied SI, HI, AVH, and pain at this time. Patient also denied any signs/symptoms of depression/anxiety, shaking his head, and saying "no" to this Probation officer. Patient had no stated goals for today.  A- Scheduled medications administered to patient, per MD orders. Support and encouragement provided.  Routine safety checks conducted every 15 minutes.  Patient informed to notify staff with problems or concerns.  R- No adverse drug reactions noted. Patient contracts for safety at this time. Patient compliant with medications and treatment plan. Patient receptive, calm, and cooperative. Patient interacts well with others on the unit.  Patient remains safe at this time.  Problem: Education: Goal: Ability to state activities that reduce stress will improve Outcome: Progressing   Problem: Coping: Goal: Ability to identify and develop effective coping behavior will improve Outcome: Progressing   Problem: Self-Concept: Goal: Ability to identify factors that promote anxiety will improve Outcome: Progressing Goal: Level of anxiety will decrease Outcome: Progressing Goal: Ability to modify response to factors that promote anxiety will improve Outcome: Progressing   Problem: Education: Goal: Knowledge of General Education information will improve Description: Including pain rating scale, medication(s)/side effects and non-pharmacologic comfort measures Outcome: Progressing   Problem:  Health Behavior/Discharge Planning: Goal: Ability to manage health-related needs will improve Outcome: Progressing   Problem: Clinical Measurements: Goal: Ability to maintain clinical measurements within normal limits will improve Outcome: Progressing Goal: Will remain free from infection Outcome: Progressing Goal: Diagnostic test results will improve Outcome: Progressing Goal: Respiratory complications will improve Outcome: Progressing Goal: Cardiovascular complication will be avoided Outcome: Progressing   Problem: Activity: Goal: Risk for activity intolerance will decrease Outcome: Progressing   Problem: Nutrition: Goal: Adequate nutrition will be maintained Outcome: Progressing   Problem: Coping: Goal: Level of anxiety will decrease Outcome: Progressing   Problem: Elimination: Goal: Will not experience complications related to bowel motility Outcome: Progressing Goal: Will not experience complications related to urinary retention Outcome: Progressing   Problem: Pain Managment: Goal: General experience of comfort will improve Outcome: Progressing   Problem: Safety: Goal: Ability to remain free from injury will improve Outcome: Progressing   Problem: Skin Integrity: Goal: Risk for impaired skin integrity will decrease Outcome: Progressing   Problem: Activity: Goal: Interest or engagement in leisure activities will improve Outcome: Progressing   Problem: Coping: Goal: Coping ability will improve Outcome: Progressing   Problem: Education: Goal: Will be free of psychotic symptoms Outcome: Progressing

## 2022-09-21 NOTE — Plan of Care (Incomplete)
  Problem: Activity: Goal: Interest or engagement in leisure activities will improve Outcome: Progressing   Problem: Coping: Goal: Coping ability will improve Outcome: Not Progressing

## 2022-09-21 NOTE — [facility] Group Notes (Signed)
[facility] Group Notes:  (Nursing/MHT/Case Management/Adjunct)  Date:  09/21/2022  Time:  11:51 AM  Type of Therapy:  Group Therapy  Participation Level:  Minimal   [name],Jyoti Harju E 09/21/2022, 11:51 AM

## 2022-09-21 NOTE — Progress Notes (Signed)
This Probation officer reached out to MD regarding the scheduled times for patient. Being that patient didn't receive his 0800 meds until 33, MD has moved the scheduled times of certain medications to 1500 and 2000. This writer will hold 1200 Atropine drops, until 1700. MD decided and notified this writer to do so.

## 2022-09-21 NOTE — Progress Notes (Signed)
This writer went to ask patient, once again, if he was going to take his medication and he stated "no".

## 2022-09-21 NOTE — Progress Notes (Signed)
This writer went to give patient his scheduled afternoon medication while he was sitting in the back dayroom. When this writer went to ask patient if he was going to take his medicine, he stated "no", with an angry look on his face. MD has been notified. This Probation officer will attempt to administer medication at a later time.

## 2022-09-21 NOTE — Progress Notes (Signed)
Patient is still asleep, so this writer will administer scheduled medications once he wakes up. MD was notified of this during progression rounds.

## 2022-09-22 NOTE — Progress Notes (Signed)
Patient came to the nurses station with shaving cream in his hand. This writer knows from previous encounters that patient wants to shave. This writer shaved patient's face, in which he tolerated it well without any issues. Patient remains safe on the unit. Staff will continue to monitor for safety.

## 2022-09-22 NOTE — Progress Notes (Signed)
[facility] MD Progress Note  09/22/2022 11:33 AM Adam [name]  MRN:  [id number] Subjective:  Patient is pleasantly listening to rock music this morning.  He seems cheerful.  He did take his medications this morning but refused his atropine drops, Clozaril, and and Restoril.  Per report, he followed another patient but was not aggressive.  Denies have any concerns today.  Gives me a thumbs up.  Principal Problem: Schizophrenia, chronic condition with acute exacerbation (HCC) Diagnosis: Principal Problem:   Schizophrenia, chronic condition with acute exacerbation (HCC)  Total Time spent with patient: 21 minutes  Past Psychiatric History: Past history of schizophrenia  Past Medical History:  Past Medical History:  Diagnosis Date   Myocardial infarction (HCC)    Schizophrenia (HCC)    Stroke (HCC)    History reviewed. No pertinent surgical history. Family History: History reviewed. No pertinent family history. Family Psychiatric  History: See previous Social History:  Social History   Substance and Sexual Activity  Alcohol Use Not Currently     Social History   Substance and Sexual Activity  Drug Use Not Currently    Social History   Socioeconomic History   Marital status: Single    Spouse name: Not on file   Number of children: Not on file   Years of education: Not on file   Highest education level: Not on file  Occupational History   Not on file  Tobacco Use   Smoking status: Never    Passive exposure: Never   Smokeless tobacco: Never  Vaping Use   Vaping Use: Unknown  Substance and Sexual Activity   Alcohol use: Not Currently   Drug use: Not Currently   Sexual activity: Not Currently  Other Topics Concern   Not on file  Social History Narrative   Not on file   Social Determinants of Health   Financial Resource Strain: Not on file  Food Insecurity: Not on file  Transportation Needs: Not on file  Physical Activity: Not on file  Stress: Not on file  Social  Connections: Not on file   Additional Social History:  Specify valuables returned:  (none)                      Sleep: Fair  Appetite:  Fair  Current Medications: Current Facility-Administered Medications  Medication Dose Route Frequency Provider Last Rate Last Admin   acetaminophen (TYLENOL) tablet 650 mg  650 mg Oral Q6H PRN [name], [name], MD   650 mg at 09/09/22 1813   alum & mag hydroxide-simeth (MAALOX/MYLANTA) 200-200-20 MG/5ML suspension 30 mL  30 mL Oral Q4H PRN [name], [name], MD   30 mL at 04/26/22 4034   aspirin EC tablet 81 mg  81 mg Oral Daily [name], [name], MD   81 mg at 09/22/22 0839   atorvastatin (LIPITOR) tablet 20 mg  20 mg Oral Daily [name], [name], MD   20 mg at 09/22/22 0841   atropine 1 % ophthalmic solution 1 drop  1 drop Sublingual QID [name], MD   1 drop at 09/22/22 0839   benztropine (COGENTIN) tablet 0.5 mg  0.5 mg Oral BID [name], [name], MD   0.5 mg at 09/22/22 0841   clonazePAM (KLONOPIN) tablet 1 mg  1 mg Oral TID PRN He, [name], MD   1 mg at 09/17/22 0949   cloZAPine (CLOZARIL) tablet 300 mg  300 mg Oral QHS [name], [name], MD   300 mg at 09/20/22  2102   diphenhydrAMINE (BENADRYL) capsule 50 mg  50 mg Oral Q6H PRN [name], [name], MD   50 mg at 09/17/22 1532   Or   diphenhydrAMINE (BENADRYL) injection 50 mg  50 mg Intramuscular Q6H PRN [name], [name], MD       feeding supplement (ENSURE ENLIVE / ENSURE PLUS) liquid 237 mL  237 mL Oral TID BM [name], [name], MD   237 mL at 09/22/22 1023   gabapentin (NEURONTIN) capsule 300 mg  300 mg Oral TID [name], [name], MD   300 mg at 09/22/22 8280   haloperidol (HALDOL) tablet 2 mg  2 mg Oral TID [name], [name], MD   2 mg at 09/22/22 0349   haloperidol (HALDOL) tablet 5 mg  5 mg Oral Q6H PRN [name], [name], MD   5 mg at 09/17/22 1532   Or   haloperidol lactate (HALDOL) injection 5 mg  5 mg Intramuscular Q6H PRN [name], [name], MD       lisinopril (ZESTRIL) tablet 5 mg  5 mg Oral Daily  [name], [name], MD   5 mg at 09/22/22 0841   magnesium hydroxide (MILK OF MAGNESIA) suspension 30 mL  30 mL Oral Daily PRN [name], [name], MD   30 mL at 09/15/22 1047   metoprolol succinate (TOPROL-XL) 24 hr tablet 25 mg  25 mg Oral Daily [name], [name], MD   25 mg at 09/22/22 0839   ondansetron (ZOFRAN) tablet 4 mg  4 mg Oral Q8H PRN [name], NP   4 mg at 09/06/22 2320   paliperidone (INVEGA SUSTENNA) injection 156 mg  156 mg Intramuscular Q28 days [name], [name], MD   156 mg at 09/13/22 1791   senna-docusate (Senokot-S) tablet 2 tablet  2 tablet Oral QHS PRN [name], DO   2 tablet at 09/15/22 2110   temazepam (RESTORIL) capsule 15 mg  15 mg Oral QHS [name], [name], MD   15 mg at 09/20/22 2102   ziprasidone (GEODON) injection 20 mg  20 mg Intramuscular Q12H PRN [name], [name], MD   20 mg at 07/08/22 1453    Lab Results: No results found for this or any previous visit (from the past 48 hour(s)).  Blood Alcohol level:  Lab Results  Component Value Date   ETH <10 50/56/9794    Metabolic Disorder Labs: Lab Results  Component Value Date   HGBA1C 5.5 04/10/2022   MPG 111.15 04/10/2022   No results found for: "PROLACTIN" Lab Results  Component Value Date   CHOL 167 11/20/2021   TRIG 107 11/20/2021   HDL 26 (L) 11/20/2021   CHOLHDL 6.4 11/20/2021   VLDL 21 11/20/2021   LDLCALC 120 (H) 11/20/2021    Physical Findings: AIMS: Facial and Oral Movements Muscles of Facial Expression: None, normal Lips and Perioral Area: None, normal Jaw: None, normal Tongue: None, normal,Extremity Movements Upper (arms, wrists, hands, fingers): None, normal Lower (legs, knees, ankles, toes): None, normal, Trunk Movements Neck, shoulders, hips: None, normal, Overall Severity Severity of abnormal movements (highest score from questions above): None, normal Incapacitation due to abnormal movements: None, normal Patient's awareness of abnormal movements (rate only patient's  report): No Awareness, Dental Status Current problems with teeth and/or dentures?: No Does patient usually wear dentures?: No  CIWA:    COWS:     Musculoskeletal: Strength & Muscle Tone: within normal limits Gait & Station: normal Patient leans: N/A  Psychiatric Specialty Exam:  Presentation  General Appearance:  Appropriate for Environment; Casual; Neat; Well Groomed  Eye Contact: Fair  Speech: Slow  Speech Volume: Decreased  Handedness: Left   Mood and Affect  Mood: Anxious  Affect: Congruent   Thought Process  Thought Processes: Goal Directed  Descriptions of Associations:Circumstantial  Orientation:Partial  Thought Content:Scattered  History of Schizophrenia/Schizoaffective disorder:Yes  Duration of Psychotic Symptoms:Greater than six months  Hallucinations:No data recorded Ideas of Reference:None  Suicidal Thoughts:No data recorded Homicidal Thoughts:No data recorded  Sensorium  Memory: Remote Good  Judgment: limited Insight: limited  Executive Functions  Concentration: Fair  Attention Span: Fair  Recall: [employer] of Knowledge: Fair  Language: Fair   Psychomotor Activity  Psychomotor Activity:No data recorded  Assets  Assets:No data recorded  Sleep  Sleep:No data recorded   Physical Exam: Physical Exam Vitals and nursing note reviewed.  Constitutional:      Appearance: Normal appearance.  HENT:     Head: Normocephalic and atraumatic.     Mouth/Throat:     Pharynx: Oropharynx is clear.  Eyes:     Pupils: Pupils are equal, round, and reactive to light.  Cardiovascular:     Rate and Rhythm: Normal rate and regular rhythm.  Pulmonary:     Effort: Pulmonary effort is normal.     Breath sounds: Normal breath sounds.  Abdominal:     General: Abdomen is flat.     Palpations: Abdomen is soft.  Musculoskeletal:        General: Normal range of motion.  Skin:    General: Skin is warm and dry.   Neurological:     General: No focal deficit present.     Mental Status: He is alert. Mental status is at baseline.  Psychiatric:        Attention and Perception: He is inattentive.        Mood and Affect: Affect is blunt.    Review of Systems  Unable to perform ROS: Language   Blood pressure 107/80, pulse 100, temperature 98.2 F (36.8 C), temperature source Oral, resp. rate 18, height 5\' 8"  (1.727 m), weight 85.8 kg, SpO2 97 %. Body mass index is 28.76 kg/m.   Treatment Plan Summary: Medication management and Plan patient seen and chart reviewed.  No change to presentation.  Possibly worth noticing that this week he has reverted to the delusion that he is pregnant.  This has been a recurrent delusion he has had in the past and never leads to any sort of difficulty or problem and seems to be fairly harmless.  He finds the whole thing sort of amusing.  No change to any treatment.  Still waiting for placement.  , MD 09/22/2022, 11:33 AM

## 2022-09-22 NOTE — [facility] Group Notes (Signed)
LCSW Wellness Group Note   09/22/2022 13:00  Type of Group and Topic: Psychoeducational Group:  Wellness  Participation Level:  see below  Description of Group  Wellness group introduces the topic and its focus on developing healthy habits across the spectrum and its relationship to a decrease in hospital admissions.  Six areas of wellness are discussed: physical, social spiritual, intellectual, occupational, and emotional.  Patients are asked to consider their current wellness habits and to identify areas of wellness where they are interested and able to focus on improvements.    Therapeutic Goals Patients will understand components of wellness and how they can positively impact overall health.  Patients will identify areas of wellness where they have developed good habits. Patients will identify areas of wellness where they would like to make improvements.    Summary of Patient Progress: pt came into the group room several time, made loud noises and gestures, was redirected, and then left the room each time.  No participation in anything group related.      Therapeutic Modalities: Cognitive Behavioral Therapy Psychoeducation    Joanne Chars, LCSW

## 2022-09-22 NOTE — Plan of Care (Signed)
D- Patient alert and oriented to person, place, and situation. Patient presented in a pleasant mood on assessment reporting that he slept good on his self-inventory. Patient is childlike and due to a past stroke, he has expressive aphasia. Patient has been fixated on Ritchie Blackmore and Candice Knight's rock band, called [employer]. Patient denied SI, HI, AVH, and pain at this time. Patient also denied any signs/symptoms of depression and anxiety. Patient had no complaints to voice to this Probation officer. Patient had no stated goals for today.  A- Scheduled medications administered to patient, per MD orders. Support and encouragement provided.  Routine safety checks conducted every 15 minutes.  Patient informed to notify staff with problems or concerns.  R- No adverse drug reactions noted. Patient contracts for safety at this time. Patient compliant with medications. Patient receptive, calm, and cooperative. Patient has not had any conflicts nor issues with others on the unit. Patient remains safe at this time.  Problem: Education: Goal: Ability to state activities that reduce stress will improve Outcome: Progressing   Problem: Coping: Goal: Ability to identify and develop effective coping behavior will improve Outcome: Progressing   Problem: Self-Concept: Goal: Ability to identify factors that promote anxiety will improve Outcome: Progressing Goal: Level of anxiety will decrease Outcome: Progressing Goal: Ability to modify response to factors that promote anxiety will improve Outcome: Progressing   Problem: Education: Goal: Knowledge of General Education information will improve Description: Including pain rating scale, medication(s)/side effects and non-pharmacologic comfort measures Outcome: Progressing   Problem: Health Behavior/Discharge Planning: Goal: Ability to manage health-related needs will improve Outcome: Progressing   Problem: Clinical Measurements: Goal: Ability to  maintain clinical measurements within normal limits will improve Outcome: Progressing Goal: Will remain free from infection Outcome: Progressing Goal: Diagnostic test results will improve Outcome: Progressing Goal: Respiratory complications will improve Outcome: Progressing Goal: Cardiovascular complication will be avoided Outcome: Progressing   Problem: Activity: Goal: Risk for activity intolerance will decrease Outcome: Progressing   Problem: Nutrition: Goal: Adequate nutrition will be maintained Outcome: Progressing   Problem: Coping: Goal: Level of anxiety will decrease Outcome: Progressing   Problem: Elimination: Goal: Will not experience complications related to bowel motility Outcome: Progressing Goal: Will not experience complications related to urinary retention Outcome: Progressing   Problem: Pain Managment: Goal: General experience of comfort will improve Outcome: Progressing   Problem: Safety: Goal: Ability to remain free from injury will improve Outcome: Progressing   Problem: Skin Integrity: Goal: Risk for impaired skin integrity will decrease Outcome: Progressing   Problem: Activity: Goal: Interest or engagement in leisure activities will improve Outcome: Progressing   Problem: Coping: Goal: Coping ability will improve Outcome: Progressing   Problem: Education: Goal: Will be free of psychotic symptoms Outcome: Progressing

## 2022-09-23 NOTE — Plan of Care (Signed)
D- Patient alert and oriented to person, place, and situation. Patient presented in a pleasant mood on assessment reporting on his self-inventory that he slept "good" last night and had no complaints to voice to this Probation officer. Patient is childlike and due to a past stroke, he has expressive aphasia. Patient has been fixated on Gilman Schmidt, from [facility], and coming up to the nurses station repeating his name. Patient denied SI, HI, AVH, and pain at this time. Patient also denied any signs/symptoms of depression and anxiety. Patient had no complaints to voice to this Probation officer.  A- Scheduled medications administered to patient, per MD orders. Support and encouragement provided.  Routine safety checks conducted every 15 minutes.  Patient informed to notify staff with problems or concerns.  R- No adverse drug reactions noted. Patient contracts for safety at this time. Patient compliant with medications. Patient receptive, calm, and cooperative. Patient has not had any conflicts, nor issues with other members on the unit. Patient remains safe at this time.  Problem: Education: Goal: Ability to state activities that reduce stress will improve Outcome: Progressing   Problem: Coping: Goal: Ability to identify and develop effective coping behavior will improve Outcome: Progressing   Problem: Self-Concept: Goal: Ability to identify factors that promote anxiety will improve Outcome: Progressing Goal: Level of anxiety will decrease Outcome: Progressing Goal: Ability to modify response to factors that promote anxiety will improve Outcome: Progressing   Problem: Education: Goal: Knowledge of General Education information will improve Description: Including pain rating scale, medication(s)/side effects and non-pharmacologic comfort measures Outcome: Progressing   Problem: Health Behavior/Discharge Planning: Goal: Ability to manage health-related needs will improve Outcome: Progressing   Problem:  Clinical Measurements: Goal: Ability to maintain clinical measurements within normal limits will improve Outcome: Progressing Goal: Will remain free from infection Outcome: Progressing Goal: Diagnostic test results will improve Outcome: Progressing Goal: Respiratory complications will improve Outcome: Progressing Goal: Cardiovascular complication will be avoided Outcome: Progressing   Problem: Activity: Goal: Risk for activity intolerance will decrease Outcome: Progressing   Problem: Nutrition: Goal: Adequate nutrition will be maintained Outcome: Progressing   Problem: Coping: Goal: Level of anxiety will decrease Outcome: Progressing   Problem: Elimination: Goal: Will not experience complications related to bowel motility Outcome: Progressing Goal: Will not experience complications related to urinary retention Outcome: Progressing   Problem: Pain Managment: Goal: General experience of comfort will improve Outcome: Progressing   Problem: Safety: Goal: Ability to remain free from injury will improve Outcome: Progressing   Problem: Skin Integrity: Goal: Risk for impaired skin integrity will decrease Outcome: Progressing   Problem: Activity: Goal: Interest or engagement in leisure activities will improve Outcome: Progressing   Problem: Coping: Goal: Coping ability will improve Outcome: Progressing   Problem: Education: Goal: Will be free of psychotic symptoms Outcome: Progressing

## 2022-09-23 NOTE — Plan of Care (Signed)
Pt is calm and cooperative, no complaints of distress, compliant with medication. Denies SI HI AVH.  Rested well throughout night

## 2022-09-23 NOTE — Progress Notes (Signed)
[facility] MD Progress Note  09/23/2022 9:32 AM Adam [name]  MRN:  [id number] Subjective:  10/22 Per report, patient did not have any agitation.  He paced the halls often yesterday.  He was bright and alert.  No other concerns.  He did end up taking all of his nighttime medications unlike the previous night.  Slept well last night.  Unable to ascertain mood this morning.  Pending placement.  10/21 Patient is pleasantly listening to rock music this morning.  He seems cheerful.  He did take his medications this morning but refused his atropine drops, Clozaril, and and Restoril.  Per report, he followed another patient but was not aggressive.  Denies have any concerns today.  Gives me a thumbs up.  Principal Problem: Schizophrenia, chronic condition with acute exacerbation ([place]) Diagnosis: Principal Problem:   Schizophrenia, chronic condition with acute exacerbation ([place])  Total Time spent with patient: 20 minutes  Past Psychiatric History: Past history of schizophrenia  Past Medical History:  Past Medical History:  Diagnosis Date   Myocardial infarction ([place])    Schizophrenia ([place])    Stroke ([place])    History reviewed. No pertinent surgical history. Family History: History reviewed. No pertinent family history. Family Psychiatric  History: See previous Social History:  Social History   Substance and Sexual Activity  Alcohol Use Not Currently     Social History   Substance and Sexual Activity  Drug Use Not Currently    Social History   Socioeconomic History   Marital status: Single    Spouse name: Not on file   Number of children: Not on file   Years of education: Not on file   Highest education level: Not on file  Occupational History   Not on file  Tobacco Use   Smoking status: Never    Passive exposure: Never   Smokeless tobacco: Never  Vaping Use   Vaping Use: Unknown  Substance and Sexual Activity   Alcohol use: Not Currently   Drug use: Not Currently   Sexual  activity: Not Currently  Other Topics Concern   Not on file  Social History Narrative   Not on file   Social Determinants of Health   Financial Resource Strain: Not on file  Food Insecurity: Not on file  Transportation Needs: Not on file  Physical Activity: Not on file  Stress: Not on file  Social Connections: Not on file   Additional Social History:  Specify valuables returned:  (none)                      Sleep: Fair  Appetite:  Fair  Current Medications: Current Facility-Administered Medications  Medication Dose Route Frequency Provider Last Rate Last Admin   acetaminophen (TYLENOL) tablet 650 mg  650 mg Oral Q6H PRN [name], [name], MD   650 mg at 09/09/22 1813   alum & mag hydroxide-simeth (MAALOX/MYLANTA) 200-200-20 MG/5ML suspension 30 mL  30 mL Oral Q4H PRN [name], [name], MD   30 mL at 04/26/22 [id number]   aspirin EC tablet 81 mg  81 mg Oral Daily [name], [name], MD   81 mg at 09/22/22 0839   atorvastatin (LIPITOR) tablet 20 mg  20 mg Oral Daily [name], [name], MD   20 mg at 09/22/22 0841   atropine 1 % ophthalmic solution 1 drop  1 drop Sublingual QID [name], MD   1 drop at 09/22/22 2145   benztropine (COGENTIN) tablet 0.5 mg  0.5 mg  Oral BID [name], [name], MD   0.5 mg at 09/22/22 1600   clonazePAM (KLONOPIN) tablet 1 mg  1 mg Oral TID PRN He, [name], MD   1 mg at 09/17/22 0949   cloZAPine (CLOZARIL) tablet 300 mg  300 mg Oral QHS [name], [name], MD   300 mg at 09/22/22 2145   diphenhydrAMINE (BENADRYL) capsule 50 mg  50 mg Oral Q6H PRN [name], [name], MD   50 mg at 09/17/22 1532   Or   diphenhydrAMINE (BENADRYL) injection 50 mg  50 mg Intramuscular Q6H PRN [name], [name], MD       feeding supplement (ENSURE ENLIVE / ENSURE PLUS) liquid 237 mL  237 mL Oral TID BM [name], [name], MD   237 mL at 09/22/22 2022   gabapentin (NEURONTIN) capsule 300 mg  300 mg Oral TID [name], [name], MD   300 mg at 09/22/22 2036   haloperidol (HALDOL) tablet 2 mg  2 mg  Oral TID [name], [name], MD   2 mg at 09/22/22 2036   haloperidol (HALDOL) tablet 5 mg  5 mg Oral Q6H PRN [name], [name], MD   5 mg at 09/17/22 1532   Or   haloperidol lactate (HALDOL) injection 5 mg  5 mg Intramuscular Q6H PRN [name], [name], MD       lisinopril (ZESTRIL) tablet 5 mg  5 mg Oral Daily [name], [name], MD   5 mg at 09/22/22 0841   magnesium hydroxide (MILK OF MAGNESIA) suspension 30 mL  30 mL Oral Daily PRN [name], [name], MD   30 mL at 09/15/22 1047   metoprolol succinate (TOPROL-XL) 24 hr tablet 25 mg  25 mg Oral Daily [name], [name], MD   25 mg at 09/22/22 0839   ondansetron (ZOFRAN) tablet 4 mg  4 mg Oral Q8H PRN [name], NP   4 mg at 09/06/22 2320   paliperidone (INVEGA SUSTENNA) injection 156 mg  156 mg Intramuscular Q28 days [name], [name], MD   156 mg at 09/13/22 0998   senna-docusate (Senokot-S) tablet 2 tablet  2 tablet Oral QHS PRN [name], DO   2 tablet at 09/15/22 2110   temazepam (RESTORIL) capsule 15 mg  15 mg Oral QHS [name], [name], MD   15 mg at 09/22/22 2145   ziprasidone (GEODON) injection 20 mg  20 mg Intramuscular Q12H PRN [name], [name], MD   20 mg at 07/08/22 1453    Lab Results: No results found for this or any previous visit (from the past 39 hour(s)).  Blood Alcohol level:  Lab Results  Component Value Date   ETH <10 33/82/5053    Metabolic Disorder Labs: Lab Results  Component Value Date   HGBA1C 5.5 04/10/2022   MPG 111.15 04/10/2022   No results found for: "PROLACTIN" Lab Results  Component Value Date   CHOL 167 11/20/2021   TRIG 107 11/20/2021   HDL 26 (L) 11/20/2021   CHOLHDL 6.4 11/20/2021   VLDL 21 11/20/2021   LDLCALC 120 (H) 11/20/2021    Physical Findings: AIMS: Facial and Oral Movements Muscles of Facial Expression: None, normal Lips and Perioral Area: None, normal Jaw: None, normal Tongue: None, normal,Extremity Movements Upper (arms, wrists, hands, fingers): None, normal Lower (legs,  knees, ankles, toes): None, normal, Trunk Movements Neck, shoulders, hips: None, normal, Overall Severity Severity of abnormal movements (highest score from questions above): None, normal Incapacitation due to abnormal movements: None,  normal Patient's awareness of abnormal movements (rate only patient's report): No Awareness, Dental Status Current problems with teeth and/or dentures?: No Does patient usually wear dentures?: No  CIWA:    COWS:     Musculoskeletal: Strength & Muscle Tone: within normal limits Gait & Station: normal Patient leans: N/A  Psychiatric Specialty Exam:  Presentation  General Appearance:  Appropriate for Environment; Casual; Neat; Well Groomed  Eye Contact: Fair  Speech: Slow  Speech Volume: Decreased  Handedness: Left   Mood and Affect  Mood: Anxious  Affect: Congruent   Thought Process  Thought Processes: Goal Directed  Descriptions of Associations:Circumstantial  Orientation:Partial  Thought Content:Scattered  History of Schizophrenia/Schizoaffective disorder:Yes  Duration of Psychotic Symptoms:Greater than six months  Hallucinations:No data recorded Ideas of Reference:None  Suicidal Thoughts:No data recorded Homicidal Thoughts:No data recorded  Sensorium  Memory: Remote Good  Judgment: limited Insight: limited  Executive Functions  Concentration: Fair  Attention Span: Fair  Recall: [place] of Knowledge: Fair  Language: Fair   Psychomotor Activity  Psychomotor Activity:No data recorded  Assets  Assets:No data recorded  Sleep  Sleep:No data recorded  Blood pressure 102/84, pulse 82, temperature 98.7 F (37.1 C), temperature source Oral, resp. rate 18, height 5\' 8"  (1.727 m), weight 85.8 kg, SpO2 99 %. Body mass index is 28.76 kg/m.   Treatment Plan Summary: Medication management and Plan patient seen and chart reviewed.  No change to presentation.  Possibly worth noticing that this  week he has reverted to the delusion that he is pregnant.  This has been a recurrent delusion he has had in the past and never leads to any sort of difficulty or problem and seems to be fairly harmless.  He finds the whole thing sort of amusing.  No change to any treatment.  Still waiting for placement.   10/22 No changes  Rulon Sera, MD 09/23/2022, 9:32 AM

## 2022-09-23 NOTE — [facility] Group Notes (Signed)
[facility] Notes:  (Nursing/MHT/Case Management/Adjunct)  Date:  09/23/2022  Time:  8:55 PM  Type of Therapy:   Wrap up  Participation Level:  Minimal  Participation Quality:  Appropriate  Affect:  Appropriate  Cognitive:  Alert  Insight:  Good  Engagement in Group:  None  Modes of Intervention:  Support  Summary of Progress/Problems:  Adam [name] 09/23/2022, 8:55 PM

## 2022-09-23 NOTE — Progress Notes (Signed)
Patient was outside, in the courtyard, with staff and other members on the unit. Patient was observed enjoying the fresh air, and sunshine. Patient was also dancing, while listening to the music being played. Patient remains safe and staff will continue to monitor for safety.

## 2022-09-23 NOTE — Progress Notes (Signed)
Patient is asleep, so this writer will administer morning medication once he wakes up.

## 2022-09-23 NOTE — Progress Notes (Signed)
MD has been made aware that patient is still sleeping and is ok with this writer giving patient scheduled medication once he wakes up.

## 2022-09-24 NOTE — Progress Notes (Signed)
[facility] MD Progress Note  09/24/2022 2:57 PM Adam [name]  MRN:  [id number] Subjective: Follow-up patient with schizophrenia.  No new complaint.  Has not been engaging in any dangerous or violent behavior.  Calm and cooperative for the most part.  Takes care of his own ADLs. Principal Problem: Schizophrenia, chronic condition with acute exacerbation ([place]) Diagnosis: Principal Problem:   Schizophrenia, chronic condition with acute exacerbation ([place])  Total Time spent with patient: 20 minutes  Past Psychiatric History: History of schizophrenia.  Now stable on clozapine has been in the hospital long time but is ready for discharge  Past Medical History:  Past Medical History:  Diagnosis Date   Myocardial infarction ([place])    Schizophrenia ([place])    Stroke [facility])    History reviewed. No pertinent surgical history. Family History: History reviewed. No pertinent family history. Family Psychiatric  History: See previous Social History:  Social History   Substance and Sexual Activity  Alcohol Use Not Currently     Social History   Substance and Sexual Activity  Drug Use Not Currently    Social History   Socioeconomic History   Marital status: Single    Spouse name: Not on file   Number of children: Not on file   Years of education: Not on file   Highest education level: Not on file  Occupational History   Not on file  Tobacco Use   Smoking status: Never    Passive exposure: Never   Smokeless tobacco: Never  Vaping Use   Vaping Use: Unknown  Substance and Sexual Activity   Alcohol use: Not Currently   Drug use: Not Currently   Sexual activity: Not Currently  Other Topics Concern   Not on file  Social History Narrative   Not on file   Social Determinants of Health   Financial Resource Strain: Not on file  Food Insecurity: Not on file  Transportation Needs: Not on file  Physical Activity: Not on file  Stress: Not on file  Social Connections: Not on file   Additional  Social History:  Specify valuables returned:  (none)                      Sleep: Fair  Appetite:  Fair  Current Medications: Current Facility-Administered Medications  Medication Dose Route Frequency Provider Last Rate Last Admin   acetaminophen (TYLENOL) tablet 650 mg  650 mg Oral Q6H PRN Milanna Kozlov T, MD   650 mg at 09/09/22 1813   alum & mag hydroxide-simeth (MAALOX/MYLANTA) 200-200-20 MG/5ML suspension 30 mL  30 mL Oral Q4H PRN Walsie Smeltz T, MD   30 mL at 04/26/22 4098   aspirin EC tablet 81 mg  81 mg Oral Daily Angelicia Lessner, [name], MD   81 mg at 09/24/22 0742   atorvastatin (LIPITOR) tablet 20 mg  20 mg Oral Daily Hilary Milks T, MD   20 mg at 09/24/22 0742   atropine 1 % ophthalmic solution 1 drop  1 drop Sublingual QID [name], MD   1 drop at 09/24/22 1207   benztropine (COGENTIN) tablet 0.5 mg  0.5 mg Oral BID Reynol Arnone T, MD   0.5 mg at 09/24/22 1191   clonazePAM (KLONOPIN) tablet 1 mg  1 mg Oral TID PRN He, [name], MD   1 mg at 09/17/22 0949   cloZAPine (CLOZARIL) tablet 300 mg  300 mg Oral QHS Roxanne Orner T, MD   300 mg at 09/23/22 2112   diphenhydrAMINE (BENADRYL)  capsule 50 mg  50 mg Oral Q6H PRN Oumar Marcott T, MD   50 mg at 09/17/22 1532   Or   diphenhydrAMINE (BENADRYL) injection 50 mg  50 mg Intramuscular Q6H PRN Nathan Moctezuma T, MD       feeding supplement (ENSURE ENLIVE / ENSURE PLUS) liquid 237 mL  237 mL Oral TID BM Jacquise Rarick T, MD   237 mL at 09/24/22 1008   gabapentin (NEURONTIN) capsule 300 mg  300 mg Oral TID Ryelynn Guedea T, MD   300 mg at 09/24/22 1275   haloperidol (HALDOL) tablet 2 mg  2 mg Oral TID Toben Acuna T, MD   2 mg at 09/24/22 1700   haloperidol (HALDOL) tablet 5 mg  5 mg Oral Q6H PRN Myleah Cavendish T, MD   5 mg at 09/17/22 1532   Or   haloperidol lactate (HALDOL) injection 5 mg  5 mg Intramuscular Q6H PRN Emmalena Canny T, MD       lisinopril (ZESTRIL) tablet 5 mg  5 mg Oral Daily Erykah Lippert T, MD   5 mg at 09/24/22 1749    magnesium hydroxide (MILK OF MAGNESIA) suspension 30 mL  30 mL Oral Daily PRN Charniece Venturino T, MD   30 mL at 09/15/22 1047   metoprolol succinate (TOPROL-XL) 24 hr tablet 25 mg  25 mg Oral Daily Pearla Mckinny T, MD   25 mg at 09/24/22 0742   ondansetron (ZOFRAN) tablet 4 mg  4 mg Oral Q8H PRN Lerry Liner M, NP   4 mg at 09/06/22 2320   paliperidone (INVEGA SUSTENNA) injection 156 mg  156 mg Intramuscular Q28 days Jahred Tatar, [name], MD   156 mg at 09/13/22 4496   senna-docusate (Senokot-S) tablet 2 tablet  2 tablet Oral QHS PRN Sarina Ill, DO   2 tablet at 09/15/22 2110   temazepam (RESTORIL) capsule 15 mg  15 mg Oral QHS Zariah Jost T, MD   15 mg at 09/23/22 2113   ziprasidone (GEODON) injection 20 mg  20 mg Intramuscular Q12H PRN Lauralie Blacksher, [name], MD   20 mg at 07/08/22 1453    Lab Results: No results found for this or any previous visit (from the past 48 hour(s)).  Blood Alcohol level:  Lab Results  Component Value Date   ETH <10 07/20/2021    Metabolic Disorder Labs: Lab Results  Component Value Date   HGBA1C 5.5 04/10/2022   MPG 111.15 04/10/2022   No results found for: "PROLACTIN" Lab Results  Component Value Date   CHOL 167 11/20/2021   TRIG 107 11/20/2021   HDL 26 (L) 11/20/2021   CHOLHDL 6.4 11/20/2021   VLDL 21 11/20/2021   LDLCALC 120 (H) 11/20/2021    Physical Findings: AIMS: Facial and Oral Movements Muscles of Facial Expression: None, normal Lips and Perioral Area: None, normal Jaw: None, normal Tongue: None, normal,Extremity Movements Upper (arms, wrists, hands, fingers): None, normal Lower (legs, knees, ankles, toes): None, normal, Trunk Movements Neck, shoulders, hips: None, normal, Overall Severity Severity of abnormal movements (highest score from questions above): None, normal Incapacitation due to abnormal movements: None, normal Patient's awareness of abnormal movements (rate only patient's report): No Awareness, Dental  Status Current problems with teeth and/or dentures?: No Does patient usually wear dentures?: No  CIWA:    COWS:     Musculoskeletal: Strength & Muscle Tone: within normal limits Gait & Station: normal Patient leans: N/A  Psychiatric Specialty Exam:  Presentation  General Appearance:  Appropriate  for Environment; Casual; Neat; Well Groomed  Eye Contact: Fair  Speech: Slow  Speech Volume: Decreased  Handedness: Left   Mood and Affect  Mood: Anxious  Affect: Congruent   Thought Process  Thought Processes: Goal Directed  Descriptions of Associations:Circumstantial  Orientation:Partial  Thought Content:Scattered  History of Schizophrenia/Schizoaffective disorder:Yes  Duration of Psychotic Symptoms:Greater than six months  Hallucinations:No data recorded Ideas of Reference:None  Suicidal Thoughts:No data recorded Homicidal Thoughts:No data recorded  Sensorium  Memory: Remote Good  Judgment: Fair  Insight: Fair   Art therapist  Concentration: Fair  Attention Span: Fair  Recall: [employer] of Knowledge: Fair  Language: Fair   Psychomotor Activity  Psychomotor Activity:No data recorded  Assets  Assets:No data recorded  Sleep  Sleep:No data recorded   Physical Exam: Physical Exam Vitals and nursing note reviewed.  Constitutional:      Appearance: Normal appearance.  HENT:     Head: Normocephalic and atraumatic.     Mouth/Throat:     Pharynx: Oropharynx is clear.  Eyes:     Pupils: Pupils are equal, round, and reactive to light.  Cardiovascular:     Rate and Rhythm: Normal rate and regular rhythm.  Pulmonary:     Effort: Pulmonary effort is normal.     Breath sounds: Normal breath sounds.  Abdominal:     General: Abdomen is flat.     Palpations: Abdomen is soft.  Musculoskeletal:        General: Normal range of motion.  Skin:    General: Skin is warm and dry.  Neurological:     General: No focal deficit  present.     Mental Status: He is alert. Mental status is at baseline.  Psychiatric:        Attention and Perception: He is inattentive.        Speech: He is noncommunicative.    Review of Systems  Unable to perform ROS: Language   Blood pressure (!) 135/95, pulse 85, temperature 98.5 F (36.9 C), resp. rate 18, height 5\' 8"  (1.727 m), weight 85.8 kg, SpO2 98 %. Body mass index is 28.76 kg/m.   Treatment Plan Summary: Medication management and Plan no change to medication or treatment plan at all.  Entirely focused on discharge  , MD 09/24/2022, 2:57 PM

## 2022-09-24 NOTE — Progress Notes (Signed)
Patient calm and pleasant during assessment denying SI/HI/AVH. Pt observed interacting appropriately with staff and peers on the unit. Pt compliant with medication administration per MD orders. Pt given education, support, and encouragement to be active in his treatment plan. Pt being monitored Q 15 minutes for safety per unit protocol, remains safe on the unit  

## 2022-09-24 NOTE — Progress Notes (Signed)
Patient was cooperative with treatment, no new behavioral issues or new issues to report at this time.

## 2022-09-24 NOTE — Plan of Care (Signed)
Patient appropriate with staff & peers. No issues verbalized. Patient at nurses station multiple times. Appetite and energy level good. Compliant with medications. ADLs maintained. Support and encouragement given.

## 2022-09-25 NOTE — Progress Notes (Signed)
[facility] MD Progress Note  09/25/2022 12:17 PM Adam [name]  MRN:  [id number] Subjective: Patient seen for follow-up today.  61 year old man with schizophrenia status post major stroke.  Patient appeared to be in one of his bad moods today.  We see these from time to time.  Sometimes there is a clear reason such as his cell phone having run out of charge other times it is hard to tell.  He is not aggressive but is staying withdrawn and not making much eye contact and not really engaging in communication.  Typically these will last hours maybe a day at a time.  No change to any behavior in terms of any dangerousness.  Still compliant with medicine Principal Problem: Schizophrenia, chronic condition with acute exacerbation (HCC) Diagnosis: Principal Problem:   Schizophrenia, chronic condition with acute exacerbation ([place])  Total Time spent with patient: 30 minutes  Past Psychiatric History: Past history of schizophrenia which has been stabilized on current medication including clozapine.  History of major stroke causing dense aphasia  Past Medical History:  Past Medical History:  Diagnosis Date   Myocardial infarction ([place])    Schizophrenia ([place])    Stroke [facility])    History reviewed. No pertinent surgical history. Family History: History reviewed. No pertinent family history. Family Psychiatric  History: See previous Social History:  Social History   Substance and Sexual Activity  Alcohol Use Not Currently     Social History   Substance and Sexual Activity  Drug Use Not Currently    Social History   Socioeconomic History   Marital status: Single    Spouse name: Not on file   Number of children: Not on file   Years of education: Not on file   Highest education level: Not on file  Occupational History   Not on file  Tobacco Use   Smoking status: Never    Passive exposure: Never   Smokeless tobacco: Never  Vaping Use   Vaping Use: Unknown  Substance and Sexual Activity   Alcohol  use: Not Currently   Drug use: Not Currently   Sexual activity: Not Currently  Other Topics Concern   Not on file  Social History Narrative   Not on file   Social Determinants of Health   Financial Resource Strain: Not on file  Food Insecurity: Not on file  Transportation Needs: Not on file  Physical Activity: Not on file  Stress: Not on file  Social Connections: Not on file   Additional Social History:  Specify valuables returned:  (none)                      Sleep: Fair  Appetite:  Fair  Current Medications: Current Facility-Administered Medications  Medication Dose Route Frequency Provider Last Rate Last Admin   acetaminophen (TYLENOL) tablet 650 mg  650 mg Oral Q6H PRN Audrea Bolte T, MD   650 mg at 09/09/22 1813   alum & mag hydroxide-simeth (MAALOX/MYLANTA) 200-200-20 MG/5ML suspension 30 mL  30 mL Oral Q4H PRN Shreyansh Tiffany T, MD   30 mL at 04/26/22 2703   aspirin EC tablet 81 mg  81 mg Oral Daily Enoc Getter, [name], MD   81 mg at 09/25/22 0856   atorvastatin (LIPITOR) tablet 20 mg  20 mg Oral Daily Pasqualina Colasurdo T, MD   20 mg at 09/25/22 0856   atropine 1 % ophthalmic solution 1 drop  1 drop Sublingual QID [name], MD   1 drop at 09/25/22  [id number]   benztropine (COGENTIN) tablet 0.5 mg  0.5 mg Oral BID Makailyn Mccormick T, MD   0.5 mg at 09/25/22 0856   clonazePAM (KLONOPIN) tablet 1 mg  1 mg Oral TID PRN He, [name], MD   1 mg at 09/17/22 0949   cloZAPine (CLOZARIL) tablet 300 mg  300 mg Oral QHS Ladrea Holladay T, MD   300 mg at 09/24/22 2113   diphenhydrAMINE (BENADRYL) capsule 50 mg  50 mg Oral Q6H PRN Willma Obando T, MD   50 mg at 09/17/22 1532   Or   diphenhydrAMINE (BENADRYL) injection 50 mg  50 mg Intramuscular Q6H PRN Aidah Forquer T, MD       feeding supplement (ENSURE ENLIVE / ENSURE PLUS) liquid 237 mL  237 mL Oral TID BM Shamira Toutant T, MD   237 mL at 09/25/22 1048   gabapentin (NEURONTIN) capsule 300 mg  300 mg Oral TID Davene Jobin T, MD   300 mg at  09/25/22 [id number]   haloperidol (HALDOL) tablet 2 mg  2 mg Oral TID Candyce Gambino, [name], MD   2 mg at 09/25/22 [id number]   haloperidol (HALDOL) tablet 5 mg  5 mg Oral Q6H PRN Jeanmarie Mccowen T, MD   5 mg at 09/17/22 1532   Or   haloperidol lactate (HALDOL) injection 5 mg  5 mg Intramuscular Q6H PRN Maezie Justin T, MD       lisinopril (ZESTRIL) tablet 5 mg  5 mg Oral Daily Dorota Heinrichs T, MD   5 mg at 09/25/22 0856   magnesium hydroxide (MILK OF MAGNESIA) suspension 30 mL  30 mL Oral Daily PRN Savien Mamula T, MD   30 mL at 09/15/22 1047   metoprolol succinate (TOPROL-XL) 24 hr tablet 25 mg  25 mg Oral Daily Demani Mcbrien T, MD   25 mg at 09/25/22 0856   ondansetron (ZOFRAN) tablet 4 mg  4 mg Oral Q8H PRN [name], NP   4 mg at 09/06/22 2320   paliperidone (INVEGA SUSTENNA) injection 156 mg  156 mg Intramuscular Q28 days Anup Brigham, [name], MD   156 mg at 09/13/22 [id number]   senna-docusate (Senokot-S) tablet 2 tablet  2 tablet Oral QHS PRN [name], DO   2 tablet at 09/15/22 2110   temazepam (RESTORIL) capsule 15 mg  15 mg Oral QHS Jakaden Ouzts T, MD   15 mg at 09/24/22 2113   ziprasidone (GEODON) injection 20 mg  20 mg Intramuscular Q12H PRN Destenee Guerry, [name], MD   20 mg at 07/08/22 1453    Lab Results: No results found for this or any previous visit (from the past 32 hour(s)).  Blood Alcohol level:  Lab Results  Component Value Date   ETH <10 [id number]    Metabolic Disorder Labs: Lab Results  Component Value Date   HGBA1C 5.5 04/10/2022   MPG 111.15 04/10/2022   No results found for: "PROLACTIN" Lab Results  Component Value Date   CHOL 167 11/20/2021   TRIG 107 11/20/2021   HDL 26 (L) 11/20/2021   CHOLHDL 6.4 11/20/2021   VLDL 21 11/20/2021   LDLCALC 120 (H) 11/20/2021    Physical Findings: AIMS: Facial and Oral Movements Muscles of Facial Expression: None, normal Lips and Perioral Area: None, normal Jaw: None, normal Tongue: None, normal,Extremity Movements Upper  (arms, wrists, hands, fingers): None, normal Lower (legs, knees, ankles, toes): None, normal, Trunk Movements Neck, shoulders, hips: None, normal, Overall Severity Severity of abnormal movements (highest score  from questions above): None, normal Incapacitation due to abnormal movements: None, normal Patient's awareness of abnormal movements (rate only patient's report): No Awareness, Dental Status Current problems with teeth and/or dentures?: No Does patient usually wear dentures?: No  CIWA:    COWS:     Musculoskeletal: Strength & Muscle Tone: within normal limits Gait & Station: normal Patient leans: N/A  Psychiatric Specialty Exam:  Presentation  General Appearance:  Appropriate for Environment; Casual; Neat; Well Groomed  Eye Contact: Fair  Speech: Slow  Speech Volume: Decreased  Handedness: Left   Mood and Affect  Mood: Anxious  Affect: Congruent   Thought Process  Thought Processes: Goal Directed  Descriptions of Associations:Circumstantial  Orientation:Partial  Thought Content:Scattered  History of Schizophrenia/Schizoaffective disorder:Yes  Duration of Psychotic Symptoms:Greater than six months  Hallucinations:No data recorded Ideas of Reference:None  Suicidal Thoughts:No data recorded Homicidal Thoughts:No data recorded  Sensorium  Memory: Remote Good  Judgment: Fair  Insight: Fair   Community education officer  Concentration: Fair  Attention Span: Fair  Recall: [employer] of Knowledge: Fair  Language: Fair   Psychomotor Activity  Psychomotor Activity:No data recorded  Assets  Assets:No data recorded  Sleep  Sleep:No data recorded   Physical Exam: Physical Exam Vitals and nursing note reviewed.  Constitutional:      Appearance: Normal appearance.  HENT:     Head: Normocephalic and atraumatic.     Mouth/Throat:     Pharynx: Oropharynx is clear.  Eyes:     Pupils: Pupils are equal, round, and reactive to  light.  Cardiovascular:     Rate and Rhythm: Normal rate and regular rhythm.  Pulmonary:     Effort: Pulmonary effort is normal.     Breath sounds: Normal breath sounds.  Abdominal:     General: Abdomen is flat.     Palpations: Abdomen is soft.  Musculoskeletal:        General: Normal range of motion.  Skin:    General: Skin is warm and dry.  Neurological:     General: No focal deficit present.     Mental Status: He is alert. Mental status is at baseline.  Psychiatric:        Attention and Perception: He is inattentive.        Mood and Affect: Affect is blunt.        Speech: He is noncommunicative.    Review of Systems  Unable to perform ROS: Language   Blood pressure (!) 136/92, pulse 95, temperature 98.5 F (36.9 C), resp. rate 18, height 5\' 8"  (1.727 m), weight 85.8 kg, SpO2 95 %. Body mass index is 28.76 kg/m.   Treatment Plan Summary: Medication management and Plan no change to medication.  Offered support for the patient and encouraged him to let us know if there is something specific we can do to help.  Fundamentally still looking for placement.  Alethia Berthold, MD 09/25/2022, 12:17 PM

## 2022-09-25 NOTE — Progress Notes (Signed)
Recreation Therapy Notes   Date: 09/25/2022  Time: 10:20 am   Location: Craft room       Behavioral response: N/A   Intervention Topic: Creative expressions   Discussion/Intervention: Patient refused to attend group.   Clinical Observations/Feedback:  Patient refused to attend group.    Lyndell Gillyard LRT/CTRS         Sabrine Patchen 09/25/2022 1:12 PM

## 2022-09-25 NOTE — Progress Notes (Signed)
Patient came to the nurses station, pointing to this writer as well as pointing to the supply closet door and then at his face. This writer knows from previous encounters that patient wants to shave. This writer shaved patient's face and bilateral arms, in which he tolerated without any issues. Patient remains safe on the unit. Staff will continue to monitor for safety.

## 2022-09-25 NOTE — Plan of Care (Signed)
D- Patient alert and oriented to person, place, and situation. Patient presents in a pleasant mood on assessment not voicing any complaints to this Probation officer. Patient is childlike and due to a past stroke, he has expressive aphasia. Patient hasn't been as fixated on Gilman Schmidt, from [facility], today as he was the other day. However, he did come up to the nurses station with a picture of him and once this Probation officer says who he is, patient repeats his name, smiles and walks off. Patient denies SI, HI, AVH, and pain at this time. Patient also denied any signs and symptoms of depression/anxiety by saying no and shaking his head no. Patient had no stated goals for today.  A- Scheduled medications administered to patient, per MD orders. Support and encouragement provided.  Routine safety checks conducted every 15 minutes.  Patient informed to notify staff with problems or concerns.  R- No adverse drug reactions noted. Patient contracts for safety at this time. Patient compliant with medications. Patient receptive, calm, and cooperative. Patient remains safe at this time.  Problem: Education: Goal: Ability to state activities that reduce stress will improve Outcome: Progressing   Problem: Coping: Goal: Ability to identify and develop effective coping behavior will improve Outcome: Progressing   Problem: Self-Concept: Goal: Ability to identify factors that promote anxiety will improve Outcome: Progressing Goal: Level of anxiety will decrease Outcome: Progressing Goal: Ability to modify response to factors that promote anxiety will improve Outcome: Progressing   Problem: Education: Goal: Knowledge of General Education information will improve Description: Including pain rating scale, medication(s)/side effects and non-pharmacologic comfort measures Outcome: Progressing   Problem: Health Behavior/Discharge Planning: Goal: Ability to manage health-related needs will improve Outcome: Progressing    Problem: Clinical Measurements: Goal: Ability to maintain clinical measurements within normal limits will improve Outcome: Progressing Goal: Will remain free from infection Outcome: Progressing Goal: Diagnostic test results will improve Outcome: Progressing Goal: Respiratory complications will improve Outcome: Progressing Goal: Cardiovascular complication will be avoided Outcome: Progressing   Problem: Activity: Goal: Risk for activity intolerance will decrease Outcome: Progressing   Problem: Nutrition: Goal: Adequate nutrition will be maintained Outcome: Progressing   Problem: Coping: Goal: Level of anxiety will decrease Outcome: Progressing   Problem: Elimination: Goal: Will not experience complications related to bowel motility Outcome: Progressing Goal: Will not experience complications related to urinary retention Outcome: Progressing   Problem: Pain Managment: Goal: General experience of comfort will improve Outcome: Progressing   Problem: Safety: Goal: Ability to remain free from injury will improve Outcome: Progressing   Problem: Skin Integrity: Goal: Risk for impaired skin integrity will decrease Outcome: Progressing   Problem: Activity: Goal: Interest or engagement in leisure activities will improve Outcome: Progressing   Problem: Coping: Goal: Coping ability will improve Outcome: Progressing   Problem: Education: Goal: Will be free of psychotic symptoms Outcome: Progressing

## 2022-09-26 NOTE — BHH Counselor (Signed)
CSW contacted Adam [name] [phone]) to follow up regarding interview. She stated that the interview went well. Adam [name] shared that during the assessment it was determined that pt was not appropriate for a facility but was more fitted for a structured independent placement. She shared that they do have this available this week. Adam [name] then went on to state that it is ultimately up to [facility] to figure out what they would like him to do. She again stressed that there is bed availability this week but that she could not guarantee bed availability past this week. No other concerns expressed. Contact ended without incident.   [name]. Adam [name], MSW, [name], [name] 09/26/2022 10:21 AM

## 2022-09-26 NOTE — Plan of Care (Signed)
Patient appropriate with staff & peers. No issues verbalized. Compliant with meds and meals. Support and encouragement given.

## 2022-09-26 NOTE — Progress Notes (Signed)
Recreation Therapy Notes  Date: 09/26/2022  Time: 2:00pm   Location: Courtyard    Behavioral response: N/A   Intervention Topic: Leisure   Discussion/Intervention: Patient refused to attend group.   Clinical Observations/Feedback:  Patient refused to attend group.    Jene Huq LRT/CTRS        Maralyn Witherell 09/26/2022 3:48 PM

## 2022-09-26 NOTE — Plan of Care (Signed)
  Problem: Education: Goal: Ability to state activities that reduce stress will improve Outcome: Not Progressing   Problem: Coping: Goal: Ability to identify and develop effective coping behavior will improve Outcome: Not Progressing

## 2022-09-26 NOTE — Plan of Care (Signed)
  Problem: Education: Goal: Ability to state activities that reduce stress will improve Outcome: Progressing   Problem: Coping: Goal: Ability to identify and develop effective coping behavior will improve Outcome: Progressing   Problem: Self-Concept: Goal: Ability to identify factors that promote anxiety will improve Outcome: Progressing Goal: Level of anxiety will decrease Outcome: Progressing Goal: Ability to modify response to factors that promote anxiety will improve Outcome: Progressing   Problem: Education: Goal: Knowledge of General Education information will improve Description: Including pain rating scale, medication(s)/side effects and non-pharmacologic comfort measures Outcome: Progressing   Problem: Health Behavior/Discharge Planning: Goal: Ability to manage health-related needs will improve Outcome: Progressing   Problem: Clinical Measurements: Goal: Ability to maintain clinical measurements within normal limits will improve Outcome: Progressing Goal: Will remain free from infection Outcome: Progressing Goal: Diagnostic test results will improve Outcome: Progressing Goal: Respiratory complications will improve Outcome: Progressing Goal: Cardiovascular complication will be avoided Outcome: Progressing   Problem: Education: Goal: Will be free of psychotic symptoms Outcome: Progressing   Problem: Coping: Goal: Coping ability will improve Outcome: Progressing   Problem: Activity: Goal: Interest or engagement in leisure activities will improve Outcome: Progressing   Problem: Skin Integrity: Goal: Risk for impaired skin integrity will decrease Outcome: Progressing

## 2022-09-26 NOTE — Progress Notes (Signed)
[facility] MD Progress Note  09/26/2022 12:40 PM Adam [name]  MRN:  [id number]  Subjective: Patient seen for follow-up after notes reviewed and discussed in the morning meeting.  He continues to be stable with an occasional yell when singing or communicating.  No issues on the unit.  Principal Problem: Schizophrenia, chronic condition with acute exacerbation ([place]) Diagnosis: Principal Problem:   Schizophrenia, chronic condition with acute exacerbation ([place])  Total Time spent with patient: 30 minutes  Past Psychiatric History: Past history of schizophrenia which has been stabilized on current medication including clozapine.  History of major stroke causing dense aphasia  Past Medical History:  Past Medical History:  Diagnosis Date   Myocardial infarction ([place])    Schizophrenia ([place])    Stroke [facility])    History reviewed. No pertinent surgical history. Family History: History reviewed. No pertinent family history. Family Psychiatric  History: See previous Social History:  Social History   Substance and Sexual Activity  Alcohol Use Not Currently     Social History   Substance and Sexual Activity  Drug Use Not Currently    Social History   Socioeconomic History   Marital status: Single    Spouse name: Not on file   Number of children: Not on file   Years of education: Not on file   Highest education level: Not on file  Occupational History   Not on file  Tobacco Use   Smoking status: Never    Passive exposure: Never   Smokeless tobacco: Never  Vaping Use   Vaping Use: Unknown  Substance and Sexual Activity   Alcohol use: Not Currently   Drug use: Not Currently   Sexual activity: Not Currently  Other Topics Concern   Not on file  Social History Narrative   Not on file   Social Determinants of Health   Financial Resource Strain: Not on file  Food Insecurity: Not on file  Transportation Needs: Not on file  Physical Activity: Not on file  Stress: Not on file  Social  Connections: Not on file   Additional Social History:  Specify valuables returned:  (none)                      Sleep: Fair  Appetite:  Fair  Current Medications: Current Facility-Administered Medications  Medication Dose Route Frequency Provider Last Rate Last Admin   acetaminophen (TYLENOL) tablet 650 mg  650 mg Oral Q6H PRN [name], [name], MD   650 mg at 09/09/22 1813   alum & mag hydroxide-simeth (MAALOX/MYLANTA) 200-200-20 MG/5ML suspension 30 mL  30 mL Oral Q4H PRN [name], [name], MD   30 mL at 04/26/22 7829   aspirin EC tablet 81 mg  81 mg Oral Daily [name], [name], MD   81 mg at 09/26/22 0800   atorvastatin (LIPITOR) tablet 20 mg  20 mg Oral Daily [name], [name], MD   20 mg at 09/26/22 0800   atropine 1 % ophthalmic solution 1 drop  1 drop Sublingual QID [name], MD   1 drop at 09/26/22 0801   benztropine (COGENTIN) tablet 0.5 mg  0.5 mg Oral BID [name], [name], MD   0.5 mg at 09/26/22 0800   clonazePAM (KLONOPIN) tablet 1 mg  1 mg Oral TID PRN He, [name], MD   1 mg at 09/17/22 0949   cloZAPine (CLOZARIL) tablet 300 mg  300 mg Oral QHS [name], [name], MD   300 mg at 09/25/22 2047  diphenhydrAMINE (BENADRYL) capsule 50 mg  50 mg Oral Q6H PRN [name], [name], MD   50 mg at 09/17/22 1532   Or   diphenhydrAMINE (BENADRYL) injection 50 mg  50 mg Intramuscular Q6H PRN [name], [name], MD       feeding supplement (ENSURE ENLIVE / ENSURE PLUS) liquid 237 mL  237 mL Oral TID BM [name], [name], MD   237 mL at 09/26/22 1027   gabapentin (NEURONTIN) capsule 300 mg  300 mg Oral TID [name], [name], MD   300 mg at 09/26/22 1026   haloperidol (HALDOL) tablet 2 mg  2 mg Oral TID [name], [name], MD   2 mg at 09/26/22 1026   haloperidol (HALDOL) tablet 5 mg  5 mg Oral Q6H PRN [name], [name], MD   5 mg at 09/17/22 1532   Or   haloperidol lactate (HALDOL) injection 5 mg  5 mg Intramuscular Q6H PRN [name], [name], MD       lisinopril (ZESTRIL) tablet 5 mg  5 mg Oral Daily  [name], [name], MD   5 mg at 09/26/22 0800   magnesium hydroxide (MILK OF MAGNESIA) suspension 30 mL  30 mL Oral Daily PRN [name], [name], MD   30 mL at 09/15/22 1047   metoprolol succinate (TOPROL-XL) 24 hr tablet 25 mg  25 mg Oral Daily [name], [name], MD   25 mg at 09/26/22 0800   ondansetron (ZOFRAN) tablet 4 mg  4 mg Oral Q8H PRN Lerry Liner M, NP   4 mg at 09/06/22 2320   paliperidone (INVEGA SUSTENNA) injection 156 mg  156 mg Intramuscular Q28 days [name], [name], MD   156 mg at 09/13/22 0165   senna-docusate (Senokot-S) tablet 2 tablet  2 tablet Oral QHS PRN Sarina Ill, DO   2 tablet at 09/15/22 2110   temazepam (RESTORIL) capsule 15 mg  15 mg Oral QHS [name], [name], MD   15 mg at 09/25/22 2047   ziprasidone (GEODON) injection 20 mg  20 mg Intramuscular Q12H PRN [name], [name], MD   20 mg at 07/08/22 1453    Lab Results: No results found for this or any previous visit (from the past 48 hour(s)).  Blood Alcohol level:  Lab Results  Component Value Date   ETH <10 07/20/2021    Metabolic Disorder Labs: Lab Results  Component Value Date   HGBA1C 5.5 04/10/2022   MPG 111.15 04/10/2022   No results found for: "PROLACTIN" Lab Results  Component Value Date   CHOL 167 11/20/2021   TRIG 107 11/20/2021   HDL 26 (L) 11/20/2021   CHOLHDL 6.4 11/20/2021   VLDL 21 11/20/2021   LDLCALC 120 (H) 11/20/2021    Physical Findings: AIMS: Facial and Oral Movements Muscles of Facial Expression: None, normal Lips and Perioral Area: None, normal Jaw: None, normal Tongue: None, normal,Extremity Movements Upper (arms, wrists, hands, fingers): None, normal Lower (legs, knees, ankles, toes): None, normal, Trunk Movements Neck, shoulders, hips: None, normal, Overall Severity Severity of abnormal movements (highest score from questions above): None, normal Incapacitation due to abnormal movements: None, normal Patient's awareness of abnormal movements (rate only patient's  report): No Awareness, Dental Status Current problems with teeth and/or dentures?: No Does patient usually wear dentures?: No  CIWA:    COWS:     Musculoskeletal: Strength & Muscle Tone: within normal limits Gait & Station: normal Patient leans: N/A  Psychiatric Specialty Exam: Physical Exam Vitals and nursing  note reviewed.  Constitutional:      Appearance: Normal appearance.  HENT:     Head: Normocephalic and atraumatic.     Nose: Nose normal.  Pulmonary:     Effort: Pulmonary effort is normal.  Musculoskeletal:        General: Normal range of motion.     Cervical back: Normal range of motion.  Neurological:     General: No focal deficit present.     Mental Status: He is alert and oriented to person, place, and time.  Psychiatric:        Attention and Perception: He is inattentive.        Mood and Affect: Affect is blunt.        Speech: Speech normal.        Behavior: Behavior normal. Behavior is cooperative.        Thought Content: Thought content normal.        Cognition and Memory: Cognition is impaired.        Judgment: Judgment is impulsive.     Review of Systems  Unable to perform ROS: Language  Psychiatric/Behavioral:  The patient is nervous/anxious.   All other systems reviewed and are negative.   Blood pressure (!) 144/77, pulse 84, temperature 98.5 F (36.9 C), resp. rate 18, height 5\' 8"  (1.727 m), weight 85.8 kg, SpO2 99 %.Body mass index is 28.76 kg/m.  General Appearance: Casual  Eye Contact:  Good  Speech:  Garbled  Volume:  Normal  Mood:  Euthymic  Affect:  Congruent  Thought Process:  Coherent  Orientation:  Full (Time, Place, and Person)  Thought Content:  UTA, difficult to understand  Suicidal Thoughts:  No  Homicidal Thoughts:  No  Memory:  Immediate;   Fair Recent;   Fair Remote;   Fair  Judgement:  Fair  Insight:  UTA  Psychomotor Activity:  Normal  Concentration:  Concentration: Fair and Attention Span: Fair  Recall:  URA  Fund  of Knowledge:  UTA  Language:  Poor  Akathisia:  No  Handed:  Right  AIMS (if indicated):     Assets:  Leisure Time Physical Health Resilience Social Support  ADL's:  Intact  Cognition:  Impaired,  Mild  Sleep:  Number of Hours: 3.75      Physical Exam: Physical Exam Vitals and nursing note reviewed.  Constitutional:      Appearance: Normal appearance.  HENT:     Head: Normocephalic and atraumatic.     Nose: Nose normal.  Pulmonary:     Effort: Pulmonary effort is normal.  Musculoskeletal:        General: Normal range of motion.     Cervical back: Normal range of motion.  Neurological:     General: No focal deficit present.     Mental Status: He is alert and oriented to person, place, and time.  Psychiatric:        Attention and Perception: He is inattentive.        Mood and Affect: Affect is blunt.        Speech: Speech normal.        Behavior: Behavior normal. Behavior is cooperative.        Thought Content: Thought content normal.        Cognition and Memory: Cognition is impaired.        Judgment: Judgment is impulsive.    Review of Systems  Unable to perform ROS: Language  Psychiatric/Behavioral:  The patient is nervous/anxious.  All other systems reviewed and are negative.  Blood pressure (!) 144/77, pulse 84, temperature 98.5 F (36.9 C), resp. rate 18, height 5\' 8"  (1.727 m), weight 85.8 kg, SpO2 99 %. Body mass index is 28.76 kg/m.   Treatment Plan Summary: Medication management and Plan no change to medication.  Offered support for the patient and encouraged him to let know if there is something specific we can do to help.  Fundamentally still looking for placement. Schizophrenia: Haldol 2 mg TID Clozaril 300 mg at bedtime Invega 156 mg monthly  Anxiety Gabapentin 300 mg BID Klonopin 1 mg TID  Insomnia Restoril 15 mg daily at bedtime  EPS: Cogentin 0.5 mg BID  [place], NP 09/26/2022, 12:40 PM

## 2022-09-26 NOTE — Progress Notes (Signed)
Active on the unit.  Presents in a very good mood this evening. Appears to enjpy being around his peers in the day room.  Has pictures of dogs and Nicole [employer] that he is carrying around today. He denies si/hi/avh/depression and anxiety at this encounter. He is med complaint and received his qhs meds without incident.  He is safe on the unit with q15 minute safety checks. No issues or concerns at this time.     C Butler-Nicholson, LPN

## 2022-09-26 NOTE — [facility] Group Notes (Signed)
[place] Group Notes:  (Nursing/MHT/Case Management/Adjunct)  Date:  09/26/2022  Time:  9:53 AM  Type of Therapy:   Community Meeting  Participation Level:  Did Not Attend   Adela Lank [facility] 09/26/2022, 9:53 AM

## 2022-09-26 NOTE — Progress Notes (Signed)
Late entry from 10:00 am 09/26/2022 [facility] Director met with [place], Medical illustrator, and Nursing leadership to discuss discharge planning action steps:  Key takeaways: '[facility]' has an array of services. For Mr. Sample, they are offering their 'duplex' living setting with supervision, not a group home.  Request for [facility]: Describe what 'wrap around services' will be in place for Mr. Bouknight if he goes to the duplex living and provide to this group via email.  Request for BMU team: Contact '[facility]' rep and inquire about whether the duplex living environment is supervised 24/7, have staff that can administer meds, provide meals, clean room, offer activities of daily living support and transport to MD appointments.  Contact mom and describe precisely what services her son will have at '[facility]', both from the [facility] and the [facility] program itself.  Next meeting with [facility] LME:  - Wednesday, November 1st at 10 a,

## 2022-09-27 LAB — SARS CORONAVIRUS 2 BY RT PCR: SARS Coronavirus 2 by RT PCR: NEGATIVE

## 2022-09-27 MED ORDER — TUBERCULIN PPD 5 UNIT/0.1ML ID SOLN
5.0000 [IU] | Freq: Once | INTRADERMAL | Status: AC
  Administered 2022-09-27: 5 [IU] via INTRADERMAL
  Filled 2022-09-27: qty 0.1

## 2022-09-27 NOTE — BHH Counselor (Signed)
CSW received call from Ms. Kennyth Lose who works with Lynelle Smoke with [employer].  CSW followed up on patient potentially being accepted to home.  Kennyth Lose reports that the bed at the duplex has been FILLED and NO LONGER available.  She reports that in the interim patient can stay at their respite facility until a bed opens up in the duplex which will not be until mid November.  She reports that staff are all med techs who can dispense medications.    She reports that someone will have to purchase the patient's food and staff can pick it up and bring it to the home.  She later stated that the staff can assist with groceries for patient and provide meals.  She reports that the staff clean the home daily, including bedroom.  She reports that staff can assist with bathing. She points out that daily showers are a requirement.    She reports that patients have to pay for transportation, but staff can take patient's to appointments.  She reports that the Day Program will be starting up soon, that there staff runs, however, there is a day program that patient can attend in the interim.   She reports that the respite home has an individual bedroom that has a pull out couch in the next room for staff to stay and monitor patient.  She reports that patient will have access to a full bath and full kitchen.  She reports the home has a big backyard and there is a TV in both the living room and the bedroom.  She requests that BMU staff provide patient with a TB screening, Covid test and identify payee.  CSW provided contact information for mother who identified herself as patient's payee.  CSW to follow up on TB and Covid.  Kennyth Lose reports that patient can transition into the home once the funding has been worked out with [employer]  Assunta Curtis, MSW, LCSW 09/27/2022 3:49 PM

## 2022-09-27 NOTE — Plan of Care (Signed)
D- Patient alert and oriented to person, place, and situation. Patient presented in a preoccupied, but pleasant mood on assessment, stating that he slept ok last night and had no complaints to voice to this Probation officer. Patient is childlike and due to a past stroke, he has expressive aphasia. Patient was fixated on cats this morning and wanted some printouts of them. This Probation officer provided this for him and he smiled and thanked this Probation officer. Patient denied any signs/symptoms of depression/anxiety. Patient also denied SI, HI, AVH, and pain at this time. Patient had no stated goals for today.  A- Scheduled medications administered to patient, per MD orders. Support and encouragement provided. Routine safety checks conducted every 15 minutes. Patient informed to notify staff with problems or concerns.  R- No adverse drug reactions noted. Patient contracts for safety at this time. Patient compliant with medications. Patient receptive, calm, and cooperative. Patient remains safe at this time.

## 2022-09-27 NOTE — Progress Notes (Signed)
Recreation Therapy Notes   Date: 09/27/2022  Time: 10:40 am   Location: Court yard     Behavioral response: N/A   Intervention Topic: Social skills      Discussion/Intervention: Patient refused to attend group.   Clinical Observations/Feedback:  Patient refused to attend group.   Lafern Brinkley LRT/CTRS        Adam [name] 09/27/2022 12:46 PM

## 2022-09-27 NOTE — Progress Notes (Signed)
[facility] MD Progress Note  09/27/2022 7:06 AM Adam [name]  MRN:  [id number]  Subjective: Patient seen for follow-up after notes reviewed and discussed in the morning meeting.  He continues to be stable with an occasional yell when singing or communicating. Has been up and moving around without any issues on the unit.  Principal Problem: Schizophrenia, chronic condition with acute exacerbation (HCC) Diagnosis: Principal Problem:   Schizophrenia, chronic condition with acute exacerbation (HCC)  Total Time spent with patient: 30 minutes  Past Psychiatric History: Past history of schizophrenia which has been stabilized on current medication including clozapine.  History of major stroke causing dense aphasia  Past Medical History:  Past Medical History:  Diagnosis Date   Myocardial infarction (HCC)    Schizophrenia (HCC)    Stroke [facility])    History reviewed. No pertinent surgical history. Family History: History reviewed. No pertinent family history. Family Psychiatric  History: See previous Social History:  Social History   Substance and Sexual Activity  Alcohol Use Not Currently     Social History   Substance and Sexual Activity  Drug Use Not Currently    Social History   Socioeconomic History   Marital status: Single    Spouse name: Not on file   Number of children: Not on file   Years of education: Not on file   Highest education level: Not on file  Occupational History   Not on file  Tobacco Use   Smoking status: Never    Passive exposure: Never   Smokeless tobacco: Never  Vaping Use   Vaping Use: Unknown  Substance and Sexual Activity   Alcohol use: Not Currently   Drug use: Not Currently   Sexual activity: Not Currently  Other Topics Concern   Not on file  Social History Narrative   Not on file   Social Determinants of Health   Financial Resource Strain: Not on file  Food Insecurity: Not on file  Transportation Needs: Not on file  Physical Activity: Not on  file  Stress: Not on file  Social Connections: Not on file   Additional Social History:  Specify valuables returned:  (none)                      Sleep: Fair  Appetite:  Fair  Current Medications: Current Facility-Administered Medications  Medication Dose Route Frequency Provider Last Rate Last Admin   acetaminophen (TYLENOL) tablet 650 mg  650 mg Oral Q6H PRN [name], [name], MD   650 mg at 09/09/22 1813   alum & mag hydroxide-simeth (MAALOX/MYLANTA) 200-200-20 MG/5ML suspension 30 mL  30 mL Oral Q4H PRN [name], [name], MD   30 mL at 04/26/22 6147   aspirin EC tablet 81 mg  81 mg Oral Daily [name], [name], MD   81 mg at 09/26/22 0800   atorvastatin (LIPITOR) tablet 20 mg  20 mg Oral Daily [name], [name], MD   20 mg at 09/26/22 0800   atropine 1 % ophthalmic solution 1 drop  1 drop Sublingual QID [name], MD   1 drop at 09/26/22 2103   benztropine (COGENTIN) tablet 0.5 mg  0.5 mg Oral BID [name], [name], MD   0.5 mg at 09/26/22 1627   clonazePAM (KLONOPIN) tablet 1 mg  1 mg Oral TID PRN He, [name], MD   1 mg at 09/17/22 0949   cloZAPine (CLOZARIL) tablet 300 mg  300 mg Oral QHS [name], [name], MD   300  mg at 09/26/22 2103   diphenhydrAMINE (BENADRYL) capsule 50 mg  50 mg Oral Q6H PRN [name], [name], MD   50 mg at 09/17/22 1532   Or   diphenhydrAMINE (BENADRYL) injection 50 mg  50 mg Intramuscular Q6H PRN [name], [name], MD       feeding supplement (ENSURE ENLIVE / ENSURE PLUS) liquid 237 mL  237 mL Oral TID BM [name], [name], MD   237 mL at 09/26/22 2018   gabapentin (NEURONTIN) capsule 300 mg  300 mg Oral TID [name], [name], MD   300 mg at 09/26/22 2059   haloperidol (HALDOL) tablet 2 mg  2 mg Oral TID [name], [name], MD   2 mg at 09/26/22 2059   haloperidol (HALDOL) tablet 5 mg  5 mg Oral Q6H PRN [name], [name], MD   5 mg at 09/17/22 1532   Or   haloperidol lactate (HALDOL) injection 5 mg  5 mg Intramuscular Q6H PRN [name], [name], MD       lisinopril  (ZESTRIL) tablet 5 mg  5 mg Oral Daily [name], [name], MD   5 mg at 09/26/22 0800   magnesium hydroxide (MILK OF MAGNESIA) suspension 30 mL  30 mL Oral Daily PRN [name], [name], MD   30 mL at 09/15/22 1047   metoprolol succinate (TOPROL-XL) 24 hr tablet 25 mg  25 mg Oral Daily [name], [name], MD   25 mg at 09/26/22 0800   ondansetron (ZOFRAN) tablet 4 mg  4 mg Oral Q8H PRN [name], NP   4 mg at 09/06/22 2320   paliperidone (INVEGA SUSTENNA) injection 156 mg  156 mg Intramuscular Q28 days [name], [name], MD   156 mg at 09/13/22 4401   senna-docusate (Senokot-S) tablet 2 tablet  2 tablet Oral QHS PRN [name], DO   2 tablet at 09/15/22 2110   temazepam (RESTORIL) capsule 15 mg  15 mg Oral QHS [name], [name], MD   15 mg at 09/26/22 2103   ziprasidone (GEODON) injection 20 mg  20 mg Intramuscular Q12H PRN [name], [name], MD   20 mg at 07/08/22 1453    Lab Results: No results found for this or any previous visit (from the past 48 hour(s)).  Blood Alcohol level:  Lab Results  Component Value Date   ETH <10 02/72/5366    Metabolic Disorder Labs: Lab Results  Component Value Date   HGBA1C 5.5 04/10/2022   MPG 111.15 04/10/2022   No results found for: "PROLACTIN" Lab Results  Component Value Date   CHOL 167 11/20/2021   TRIG 107 11/20/2021   HDL 26 (L) 11/20/2021   CHOLHDL 6.4 11/20/2021   VLDL 21 11/20/2021   LDLCALC 120 (H) 11/20/2021    Physical Findings: AIMS: Facial and Oral Movements Muscles of Facial Expression: None, normal Lips and Perioral Area: None, normal Jaw: None, normal Tongue: None, normal,Extremity Movements Upper (arms, wrists, hands, fingers): None, normal Lower (legs, knees, ankles, toes): None, normal, Trunk Movements Neck, shoulders, hips: None, normal, Overall Severity Severity of abnormal movements (highest score from questions above): None, normal Incapacitation due to abnormal movements: None, normal Patient's awareness of  abnormal movements (rate only patient's report): No Awareness, Dental Status Current problems with teeth and/or dentures?: No Does patient usually wear dentures?: No  CIWA:    COWS:     Musculoskeletal: Strength & Muscle Tone: within normal limits Gait & Station: normal Patient leans: N/A  Psychiatric Specialty  Exam: Physical Exam Vitals and nursing note reviewed.  Constitutional:      Appearance: Normal appearance.  HENT:     Head: Normocephalic and atraumatic.     Nose: Nose normal.  Pulmonary:     Effort: Pulmonary effort is normal.  Musculoskeletal:        General: Normal range of motion.     Cervical back: Normal range of motion.  Neurological:     General: No focal deficit present.     Mental Status: He is alert and oriented to person, place, and time.  Psychiatric:        Attention and Perception: He is inattentive.        Mood and Affect: Affect is blunt.        Speech: Speech normal.        Behavior: Behavior normal. Behavior is cooperative.        Thought Content: Thought content normal.        Cognition and Memory: Cognition is impaired.        Judgment: Judgment is impulsive.     Review of Systems  Unable to perform ROS: Language  Psychiatric/Behavioral:  The patient is nervous/anxious.   All other systems reviewed and are negative.   Blood pressure (!) 144/77, pulse 84, temperature 98.5 F (36.9 C), resp. rate 18, height 5\' 8"  (1.727 m), weight 85.8 kg, SpO2 99 %.Body mass index is 28.76 kg/m.  General Appearance: Casual  Eye Contact:  Good  Speech:  Garbled  Volume:  Normal  Mood:  Euthymic  Affect:  Congruent  Thought Process:  Coherent  Orientation:  Full (Time, Place, and Person)  Thought Content:  UTA, difficult to understand  Suicidal Thoughts:  No  Homicidal Thoughts:  No  Memory:  Immediate;   Fair Recent;   Fair Remote;   Fair  Judgement:  Fair  Insight:  UTA  Psychomotor Activity:  Normal  Concentration:  Concentration: Fair and  Attention Span: Fair  Recall:  URA  Fund of Knowledge:  UTA  Language:  Poor  Akathisia:  No  Handed:  Right  AIMS (if indicated):     Assets:  Leisure Time Physical Health Resilience Social Support  ADL's:  Intact  Cognition:  Impaired,  Mild  Sleep:  Number of Hours: 3.75      Physical Exam: Physical Exam Vitals and nursing note reviewed.  Constitutional:      Appearance: Normal appearance.  HENT:     Head: Normocephalic and atraumatic.     Nose: Nose normal.  Pulmonary:     Effort: Pulmonary effort is normal.  Musculoskeletal:        General: Normal range of motion.     Cervical back: Normal range of motion.  Neurological:     General: No focal deficit present.     Mental Status: He is alert and oriented to person, place, and time.  Psychiatric:        Attention and Perception: He is inattentive.        Mood and Affect: Affect is blunt.        Speech: Speech normal.        Behavior: Behavior normal. Behavior is cooperative.        Thought Content: Thought content normal.        Cognition and Memory: Cognition is impaired.        Judgment: Judgment is impulsive.    Review of Systems  Unable to perform ROS: Language  Psychiatric/Behavioral:  The patient is nervous/anxious.   All other systems reviewed and are negative.  Blood pressure (!) 144/77, pulse 84, temperature 98.5 F (36.9 C), resp. rate 18, height 5\' 8"  (1.727 m), weight 85.8 kg, SpO2 99 %. Body mass index is 28.76 kg/m.   Treatment Plan Summary: Medication management and Plan no change to medication.  Offered support for the patient and encouraged him to let know if there is something specific we can do to help.  Fundamentally still looking for placement. Schizophrenia: Haldol 2 mg TID Clozaril 300 mg at bedtime Invega 156 mg monthly  Anxiety Gabapentin 300 mg BID Klonopin 1 mg TID  Insomnia Restoril 15 mg daily at bedtime  EPS: Cogentin 0.5 mg BID  [place], NP 09/27/2022,  7:06 AM

## 2022-09-27 NOTE — Plan of Care (Signed)
Pt denies anxiety/depression at this time. Pt denies SI/HI/AVH or pain at this time. Pt is calm and cooperative. Pt is medication compliant. Pt provided with support and encouragement. Pt monitored q15 minutes for safety per unit policy. Plan of care ongoing.    Problem: Self-Concept: Goal: Level of anxiety will decrease Outcome: Progressing   Problem: Activity: Goal: Risk for activity intolerance will decrease Outcome: Progressing

## 2022-09-27 NOTE — Progress Notes (Signed)
Patient tolerated TB skin test, as well Covid Swab well, without any issues. Patient remains safe on the unit and staff will continue to monitor for safety.
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
## 2022-09-28 NOTE — Progress Notes (Signed)
[facility] MD Progress Note  09/28/2022 9:20 AM Adam [name]  MRN:  [id number]  Subjective: Patient seen for follow-up after notes reviewed and discussed in the morning meeting.  He continues to be stable with an occasional yell when singing or communicating. Has been up and moving around without any issues on the unit. Still minimally verbal when prompted with questions. Strong appetite, still performing basic ADLs.  Principal Problem: Schizophrenia, chronic condition with acute exacerbation ([place]) Diagnosis: Principal Problem:   Schizophrenia, chronic condition with acute exacerbation ([place])  Total Time spent with patient: 30 minutes  Past Psychiatric History: Past history of schizophrenia which has been stabilized on current medication including clozapine.  History of major stroke causing dense aphasia  Past Medical History:  Past Medical History:  Diagnosis Date   Myocardial infarction ([place])    Schizophrenia ([place])    Stroke [facility])    History reviewed. No pertinent surgical history. Family History: History reviewed. No pertinent family history. Family Psychiatric  History: See previous Social History:  Social History   Substance and Sexual Activity  Alcohol Use Not Currently     Social History   Substance and Sexual Activity  Drug Use Not Currently    Social History   Socioeconomic History   Marital status: Single    Spouse name: Not on file   Number of children: Not on file   Years of education: Not on file   Highest education level: Not on file  Occupational History   Not on file  Tobacco Use   Smoking status: Never    Passive exposure: Never   Smokeless tobacco: Never  Vaping Use   Vaping Use: Unknown  Substance and Sexual Activity   Alcohol use: Not Currently   Drug use: Not Currently   Sexual activity: Not Currently  Other Topics Concern   Not on file  Social History Narrative   Not on file   Social Determinants of Health   Financial Resource Strain: Not on  file  Food Insecurity: Not on file  Transportation Needs: Not on file  Physical Activity: Not on file  Stress: Not on file  Social Connections: Not on file   Additional Social History:  Specify valuables returned:  (none)                      Sleep: Fair  Appetite:  Fair  Current Medications: Current Facility-Administered Medications  Medication Dose Route Frequency Provider Last Rate Last Admin   acetaminophen (TYLENOL) tablet 650 mg  650 mg Oral Q6H PRN [name], [name], MD   650 mg at 09/09/22 1813   alum & mag hydroxide-simeth (MAALOX/MYLANTA) 200-200-20 MG/5ML suspension 30 mL  30 mL Oral Q4H PRN [name], [name], MD   30 mL at 04/26/22 4098   aspirin EC tablet 81 mg  81 mg Oral Daily [name], [name], MD   81 mg at 09/28/22 1191   atorvastatin (LIPITOR) tablet 20 mg  20 mg Oral Daily [name], [name], MD   20 mg at 09/28/22 0832   atropine 1 % ophthalmic solution 1 drop  1 drop Sublingual QID [name], MD   1 drop at 09/28/22 0832   benztropine (COGENTIN) tablet 0.5 mg  0.5 mg Oral BID [name], [name], MD   0.5 mg at 09/28/22 0832   clonazePAM (KLONOPIN) tablet 1 mg  1 mg Oral TID PRN He, [name], MD   1 mg at 09/17/22 0949   cloZAPine (CLOZARIL) tablet 300  mg  300 mg Oral QHS [name], [name], MD   300 mg at 09/27/22 2102   diphenhydrAMINE (BENADRYL) capsule 50 mg  50 mg Oral Q6H PRN [name], [name], MD   50 mg at 09/17/22 1532   Or   diphenhydrAMINE (BENADRYL) injection 50 mg  50 mg Intramuscular Q6H PRN [name], [name], MD       feeding supplement (ENSURE ENLIVE / ENSURE PLUS) liquid 237 mL  237 mL Oral TID BM [name], [name], MD   237 mL at 09/27/22 2001   gabapentin (NEURONTIN) capsule 300 mg  300 mg Oral TID [name], [name], MD   300 mg at 09/27/22 2059   haloperidol (HALDOL) tablet 2 mg  2 mg Oral TID [name], [name], MD   2 mg at 09/27/22 2059   haloperidol (HALDOL) tablet 5 mg  5 mg Oral Q6H PRN [name], [name], MD   5 mg at 09/17/22 1532   Or   haloperidol  lactate (HALDOL) injection 5 mg  5 mg Intramuscular Q6H PRN [name], [name], MD       lisinopril (ZESTRIL) tablet 5 mg  5 mg Oral Daily [name], [name], MD   5 mg at 09/28/22 8657   magnesium hydroxide (MILK OF MAGNESIA) suspension 30 mL  30 mL Oral Daily PRN [name], [name], MD   30 mL at 09/15/22 1047   metoprolol succinate (TOPROL-XL) 24 hr tablet 25 mg  25 mg Oral Daily [name], [name], MD   25 mg at 09/28/22 0832   ondansetron (ZOFRAN) tablet 4 mg  4 mg Oral Q8H PRN Jearld Lesch, NP   4 mg at 09/06/22 2320   paliperidone (INVEGA SUSTENNA) injection 156 mg  156 mg Intramuscular Q28 days [name], [name], MD   156 mg at 09/13/22 8469   senna-docusate (Senokot-S) tablet 2 tablet  2 tablet Oral QHS PRN Sarina Ill, DO   2 tablet at 09/15/22 2110   temazepam (RESTORIL) capsule 15 mg  15 mg Oral QHS [name], [name], MD   15 mg at 09/27/22 2102   tuberculin injection 5 Units  5 Units Intradermal Once Charm Rings, NP   5 Units at 09/27/22 1734   ziprasidone (GEODON) injection 20 mg  20 mg Intramuscular Q12H PRN [name], [name], MD   20 mg at 07/08/22 1453    Lab Results:  Results for orders placed or performed during the hospital encounter of 03/19/22 (from the past 48 hour(s))  SARS Coronavirus 2 by RT PCR (hospital order, performed in [facility] hospital lab) *cepheid single result test* Anterior Nasal Swab     Status: None   Collection Time: 09/27/22  5:37 PM   Specimen: Anterior Nasal Swab  Result Value Ref Range   SARS Coronavirus 2 by RT PCR NEGATIVE NEGATIVE    Comment: (NOTE) SARS-CoV-2 target nucleic acids are NOT DETECTED.  The SARS-CoV-2 RNA is generally detectable in upper and lower respiratory specimens during the acute phase of infection. The lowest concentration of SARS-CoV-2 viral copies this assay can detect is 250 copies / mL. A negative result does not preclude SARS-CoV-2 infection and should not be used as the sole basis for treatment or other patient  management decisions.  A negative result may occur with improper specimen collection / handling, submission of specimen other than nasopharyngeal swab, presence of viral mutation(s) within the areas targeted by this assay, and inadequate number of viral copies (<250 copies / mL).  A negative result must be combined with clinical observations, patient history, and epidemiological information.  Fact Sheet for Patients:   RoadLapTop.co.za  Fact Sheet for Healthcare Providers: http://kim-miller.com/  This test is not yet approved or  cleared by the [place] FDA and has been authorized for detection and/or diagnosis of SARS-CoV-2 by FDA under an Emergency Use Authorization (EUA).  This EUA will remain in effect (meaning this test can be used) for the duration of the COVID-19 declaration under Section 564(b)(1) of the Act, 21 U.S.C. section 360bbb-3(b)(1), unless the authorization is terminated or revoked sooner.  Performed at [facility], [address] [place], [place] [address]     Blood Alcohol level:  Lab Results  Component Value Date   [facility] <10 07/20/2021    Metabolic Disorder Labs: Lab Results  Component Value Date   HGBA1C 5.5 04/10/2022   MPG 111.15 04/10/2022   No results found for: "PROLACTIN" Lab Results  Component Value Date   CHOL 167 11/20/2021   TRIG 107 11/20/2021   HDL 26 (L) 11/20/2021   CHOLHDL 6.4 11/20/2021   VLDL 21 11/20/2021   LDLCALC 120 (H) 11/20/2021    Physical Findings: AIMS: Facial and Oral Movements Muscles of Facial Expression: None, normal Lips and Perioral Area: None, normal Jaw: None, normal Tongue: None, normal,Extremity Movements Upper (arms, wrists, hands, fingers): None, normal Lower (legs, knees, ankles, toes): None, normal, Trunk Movements Neck, shoulders, hips: None, normal, Overall Severity Severity of abnormal movements (highest score from questions above): None,  normal Incapacitation due to abnormal movements: None, normal Patient's awareness of abnormal movements (rate only patient's report): No Awareness, Dental Status Current problems with teeth and/or dentures?: No Does patient usually wear dentures?: No  CIWA:    COWS:     Musculoskeletal: Strength & Muscle Tone: within normal limits Gait & Station: normal Patient leans: N/A  Psychiatric Specialty Exam: Physical Exam Vitals and nursing note reviewed.  Constitutional:      Appearance: Normal appearance.  HENT:     Head: Normocephalic and atraumatic.     Nose: Nose normal.  Pulmonary:     Effort: Pulmonary effort is normal.  Musculoskeletal:        General: Normal range of motion.     Cervical back: Normal range of motion.  Neurological:     General: No focal deficit present.     Mental Status: He is alert and oriented to person, place, and time.  Psychiatric:        Attention and Perception: He is inattentive.        Mood and Affect: Affect is blunt.        Speech: Speech normal.        Behavior: Behavior normal. Behavior is cooperative.        Thought Content: Thought content normal.        Cognition and Memory: Cognition is impaired.        Judgment: Judgment is impulsive.     Review of Systems  Unable to perform ROS: Language  Psychiatric/Behavioral:  The patient is nervous/anxious.   All other systems reviewed and are negative.   Blood pressure 92/64, pulse (!) 112, temperature 97.9 F (36.6 C), resp. rate 20, height 5\' 8"  (1.727 m), weight 85.8 kg, SpO2 99 %.Body mass index is 28.76 kg/m.  General Appearance: Casual  Eye Contact:  Good  Speech:  Garbled  Volume:  Normal  Mood:  Euthymic  Affect:  Congruent  Thought Process:  Coherent  Orientation:  Full (Time, Place, and Person)  Thought Content:  UTA, difficult to understand  Suicidal Thoughts:  No  Homicidal Thoughts:  No  Memory:  Immediate;   Fair Recent;   Fair Remote;   Fair  Judgement:  Fair   Insight:  UTA  Psychomotor Activity:  Normal  Concentration:  Concentration: Fair and Attention Span: Fair  Recall:  URA  Fund of Knowledge:  UTA  Language:  Poor  Akathisia:  No  Handed:  Right  AIMS (if indicated):     Assets:  Leisure Time Physical Health Resilience Social Support  ADL's:  Intact  Cognition:  Impaired,  Mild  Sleep:  Number of Hours: 6      Physical Exam: Physical Exam Vitals and nursing note reviewed.  Constitutional:      Appearance: Normal appearance.  HENT:     Head: Normocephalic and atraumatic.     Nose: Nose normal.  Pulmonary:     Effort: Pulmonary effort is normal.  Musculoskeletal:        General: Normal range of motion.     Cervical back: Normal range of motion.  Neurological:     General: No focal deficit present.     Mental Status: He is alert and oriented to person, place, and time.  Psychiatric:        Attention and Perception: He is inattentive.        Mood and Affect: Affect is blunt.        Speech: Speech normal.        Behavior: Behavior normal. Behavior is cooperative.        Thought Content: Thought content normal.        Cognition and Memory: Cognition is impaired.        Judgment: Judgment is impulsive.    Review of Systems  Unable to perform ROS: Language  Psychiatric/Behavioral:  The patient is nervous/anxious.   All other systems reviewed and are negative.  Blood pressure 92/64, pulse (!) 112, temperature 97.9 F (36.6 C), resp. rate 20, height 5\' 8"  (1.727 m), weight 85.8 kg, SpO2 99 %. Body mass index is 28.76 kg/m.   Treatment Plan Summary: Medication management and Plan no change to medication.  Offered support for the patient and encouraged him to let know if there is something specific we can do to help.  Potential placement found for patient - performed TB skin test waiting for results and COVID testing was negative. Schizophrenia: Haldol 2 mg TID Clozaril 300 mg at bedtime Invega 156 mg  monthly  Anxiety Gabapentin 300 mg BID Klonopin 1 mg TID  Insomnia Restoril 15 mg daily at bedtime  EPS: Cogentin 0.5 mg BID  [place], NP 09/28/2022, 9:20 AM

## 2022-09-28 NOTE — Plan of Care (Signed)
D- Patient alert and oriented to person, place, and situation. Patient presented in an irritable mood on assessment yelling out "freezing, yeah freezing". This Probation officer determined that patient's room was too cold for him and had his room moved. Patient was happy after the move happened. Patient is childlike and due to a past stroke, he has expressive aphasia. Patient has been fixated on a few things today; "Ritchie Dorna Bloom, Nicole Kindred Iommi, guitars, Candice Knight and [employer] tv. Patient requested his cell phone, that was approved by leadership to have on the unit, so that he could listen to the different bands/singers he likes, as well as [employer]. Patient denies SI, HI, AVH, and pain at this time. Patient also denies any signs/symptoms of depression and anxiety. Patient had no stated goals for today.  A- Scheduled medications administered to patient, per MD orders. Support and encouragement provided.  Routine safety checks conducted every 15 minutes.  Patient informed to notify staff with problems or concerns.  R- No adverse drug reactions noted. Patient contracts for safety at this time. Patient compliant with medications and treatment plan. Patient receptive, calm, and cooperative. Patient interacts well with others on the unit.  Patient remains safe at this time.  Problem: Education: Goal: Ability to state activities that reduce stress will improve Outcome: Progressing   Problem: Coping: Goal: Ability to identify and develop effective coping behavior will improve Outcome: Progressing   Problem: Self-Concept: Goal: Ability to identify factors that promote anxiety will improve Outcome: Progressing Goal: Level of anxiety will decrease Outcome: Progressing Goal: Ability to modify response to factors that promote anxiety will improve Outcome: Progressing   Problem: Education: Goal: Knowledge of General Education information will improve Description: Including pain rating scale, medication(s)/side  effects and non-pharmacologic comfort measures Outcome: Progressing   Problem: Health Behavior/Discharge Planning: Goal: Ability to manage health-related needs will improve Outcome: Progressing   Problem: Clinical Measurements: Goal: Ability to maintain clinical measurements within normal limits will improve Outcome: Progressing Goal: Will remain free from infection Outcome: Progressing Goal: Diagnostic test results will improve Outcome: Progressing Goal: Respiratory complications will improve Outcome: Progressing Goal: Cardiovascular complication will be avoided Outcome: Progressing   Problem: Activity: Goal: Risk for activity intolerance will decrease Outcome: Progressing   Problem: Nutrition: Goal: Adequate nutrition will be maintained Outcome: Progressing   Problem: Coping: Goal: Level of anxiety will decrease Outcome: Progressing   Problem: Elimination: Goal: Will not experience complications related to bowel motility Outcome: Progressing Goal: Will not experience complications related to urinary retention Outcome: Progressing   Problem: Pain Managment: Goal: General experience of comfort will improve Outcome: Progressing   Problem: Safety: Goal: Ability to remain free from injury will improve Outcome: Progressing   Problem: Skin Integrity: Goal: Risk for impaired skin integrity will decrease Outcome: Progressing   Problem: Activity: Goal: Interest or engagement in leisure activities will improve Outcome: Progressing   Problem: Coping: Goal: Coping ability will improve Outcome: Progressing   Problem: Education: Goal: Will be free of psychotic symptoms Outcome: Progressing

## 2022-09-28 NOTE — Progress Notes (Signed)
Recreation Therapy Notes  Date: 09/28/2022  Time: 10:40 am   Location: Craft room    Behavioral response: N/A   Intervention Topic: Happiness     Discussion/Intervention: Patient refused to attend group.   Clinical Observations/Feedback:  Patient refused to attend group.   Heston Widener LRT/CTRS         Danyiel Crespin 09/28/2022 1:10 PM

## 2022-09-28 NOTE — BHH Counselor (Signed)
CSW attempted to contact the patient's mother to discuss services to be provided to the patient by [facility] as well as [facility], the potential group home.  Mother answered the phone but requested that CSW call her back in 30 minutes so that she may have her pen and paper in hand for any questions.  CSW to follow up.  Assunta Curtis, MSW, LCSW 09/28/2022 3:16 PM

## 2022-09-29 NOTE — Progress Notes (Signed)
   09/29/22 1818  PPD Results  Does patient have an induration at the injection site? No  Induration(mm) 0 mm  Name of Physician Notified Louis Meckel, DO

## 2022-09-29 NOTE — Plan of Care (Signed)
D- Patient alert and oriented to person, place, and situation. Patient presents in a preoccupied, but pleasant mood on assessment saying that he slept ok last night and had no complaints to voice to this Probation officer. Patient came out of his room, looking for the remote that he's had in his room, from the back dayroom. Once this Probation officer showed him the remote, he smiled and seemed to be satisfied with knowing where it was. Patient is childlike due to a past stroke, and has expressive aphasia. However, patient does say simple words, as well as repeats words that he hears others say. Patient's vocabulary has expanded since being on the unit. Patient is once again fixated on Gilman Schmidt, a guitarist from the rock band [employer]. Patient denies SI, HI, AVH, and pain at this time. Patient also denies any signs/symptoms of depression and anxiety. Patient had no stated goals for today.  A- Scheduled medications administered to patient, per MD orders. Support and encouragement provided.  Routine safety checks conducted every 15 minutes.  Patient informed to notify staff with problems or concerns.  R- No adverse drug reactions noted. Patient contracts for safety at this time. Patient compliant with medications. Patient receptive, calm, and cooperative. Patient remains safe at this time.  Problem: Education: Goal: Ability to state activities that reduce stress will improve Outcome: Progressing   Problem: Coping: Goal: Ability to identify and develop effective coping behavior will improve Outcome: Progressing   Problem: Self-Concept: Goal: Ability to identify factors that promote anxiety will improve Outcome: Progressing Goal: Level of anxiety will decrease Outcome: Progressing Goal: Ability to modify response to factors that promote anxiety will improve Outcome: Progressing   Problem: Education: Goal: Knowledge of General Education information will improve Description: Including pain rating scale,  medication(s)/side effects and non-pharmacologic comfort measures Outcome: Progressing   Problem: Health Behavior/Discharge Planning: Goal: Ability to manage health-related needs will improve Outcome: Progressing   Problem: Clinical Measurements: Goal: Ability to maintain clinical measurements within normal limits will improve Outcome: Progressing Goal: Will remain free from infection Outcome: Progressing Goal: Diagnostic test results will improve Outcome: Progressing Goal: Respiratory complications will improve Outcome: Progressing Goal: Cardiovascular complication will be avoided Outcome: Progressing   Problem: Activity: Goal: Risk for activity intolerance will decrease Outcome: Progressing   Problem: Nutrition: Goal: Adequate nutrition will be maintained Outcome: Progressing   Problem: Coping: Goal: Level of anxiety will decrease Outcome: Progressing   Problem: Elimination: Goal: Will not experience complications related to bowel motility Outcome: Progressing Goal: Will not experience complications related to urinary retention Outcome: Progressing   Problem: Pain Managment: Goal: General experience of comfort will improve Outcome: Progressing   Problem: Safety: Goal: Ability to remain free from injury will improve Outcome: Progressing   Problem: Skin Integrity: Goal: Risk for impaired skin integrity will decrease Outcome: Progressing   Problem: Activity: Goal: Interest or engagement in leisure activities will improve Outcome: Progressing   Problem: Coping: Goal: Coping ability will improve Outcome: Progressing   Problem: Education: Goal: Will be free of psychotic symptoms Outcome: Progressing

## 2022-09-29 NOTE — Progress Notes (Signed)
Patient came up to the nurses station with shaving cream in his hand. This writer knows from previous encounters that patient wants to shave. This writer shaved patient's face and bilateral legs, in which he tolerated it well without any issues. Patient remains safe on the unit. Staff will continue to monitor for safety.

## 2022-09-29 NOTE — Progress Notes (Signed)
Patient alert and oriented x 3 with periods of confusion to situation, he denies SI/HI/AVH interacting appropriately with peers and staff. Patient was receptive to staff, took his night time medication and there was no loud outburst or disruption from him. 15 minutes safety checks monitored.

## 2022-09-29 NOTE — Progress Notes (Signed)
Patient is still asleep, so this writer will administer scheduled medications once he wakes up. MD will be notified during progression rounds.

## 2022-09-29 NOTE — Progress Notes (Signed)
[facility] MD Progress Note  09/29/2022 12:15 PM Adam [name]  MRN:  [id number] Subjective: Adam [name] is seen on rounds.  He has been compliant with his medications.  He does not have any complaints and no issues.  Nurses state that he is doing fine.  Principal Problem: Schizophrenia, chronic condition with acute exacerbation (HCC) Diagnosis: Principal Problem:   Schizophrenia, chronic condition with acute exacerbation (HCC)  Total Time spent with patient: 15 minutes  Past Psychiatric History: CVA and schizophrenia  Past Medical History:  Past Medical History:  Diagnosis Date   Myocardial infarction (HCC)    Schizophrenia (HCC)    Stroke (HCC)    History reviewed. No pertinent surgical history. Family History: History reviewed. No pertinent family history.  Social History:  Social History   Substance and Sexual Activity  Alcohol Use Not Currently     Social History   Substance and Sexual Activity  Drug Use Not Currently    Social History   Socioeconomic History   Marital status: Single    Spouse name: Not on file   Number of children: Not on file   Years of education: Not on file   Highest education level: Not on file  Occupational History   Not on file  Tobacco Use   Smoking status: Never    Passive exposure: Never   Smokeless tobacco: Never  Vaping Use   Vaping Use: Unknown  Substance and Sexual Activity   Alcohol use: Not Currently   Drug use: Not Currently   Sexual activity: Not Currently  Other Topics Concern   Not on file  Social History Narrative   Not on file   Social Determinants of Health   Financial Resource Strain: Not on file  Food Insecurity: Not on file  Transportation Needs: Not on file  Physical Activity: Not on file  Stress: Not on file  Social Connections: Not on file   Additional Social History:  Specify valuables returned:  (none)                      Sleep: Good  Appetite:  Good  Current Medications: Current  Facility-Administered Medications  Medication Dose Route Frequency Provider Last Rate Last Admin   acetaminophen (TYLENOL) tablet 650 mg  650 mg Oral Q6H PRN [name], [name], MD   650 mg at 09/09/22 1813   alum & mag hydroxide-simeth (MAALOX/MYLANTA) 200-200-20 MG/5ML suspension 30 mL  30 mL Oral Q4H PRN [name], [name], MD   30 mL at 04/26/22 5625   aspirin EC tablet 81 mg  81 mg Oral Daily [name], [name], MD   81 mg at 09/29/22 0945   atorvastatin (LIPITOR) tablet 20 mg  20 mg Oral Daily [name], [name], MD   20 mg at 09/29/22 0945   atropine 1 % ophthalmic solution 1 drop  1 drop Sublingual QID [name], MD   1 drop at 09/29/22 1211   benztropine (COGENTIN) tablet 0.5 mg  0.5 mg Oral BID [name], [name], MD   0.5 mg at 09/29/22 0945   clonazePAM (KLONOPIN) tablet 1 mg  1 mg Oral TID PRN He, [name], MD   1 mg at 09/28/22 2130   cloZAPine (CLOZARIL) tablet 300 mg  300 mg Oral QHS [name], [name], MD   300 mg at 09/28/22 2129   diphenhydrAMINE (BENADRYL) capsule 50 mg  50 mg Oral Q6H PRN [name], [name], MD   50 mg at 09/17/22 1532   Or  diphenhydrAMINE (BENADRYL) injection 50 mg  50 mg Intramuscular Q6H PRN [name], [name], MD       feeding supplement (ENSURE ENLIVE / ENSURE PLUS) liquid 237 mL  237 mL Oral TID BM [name], [name], MD   237 mL at 09/29/22 0933   gabapentin (NEURONTIN) capsule 300 mg  300 mg Oral TID [name], [name], MD   300 mg at 09/29/22 0945   haloperidol (HALDOL) tablet 2 mg  2 mg Oral TID [name], [name], MD   2 mg at 09/29/22 0945   haloperidol (HALDOL) tablet 5 mg  5 mg Oral Q6H PRN [name], [name], MD   5 mg at 09/17/22 1532   Or   haloperidol lactate (HALDOL) injection 5 mg  5 mg Intramuscular Q6H PRN [name], [name], MD       lisinopril (ZESTRIL) tablet 5 mg  5 mg Oral Daily [name], [name], MD   5 mg at 09/29/22 0945   magnesium hydroxide (MILK OF MAGNESIA) suspension 30 mL  30 mL Oral Daily PRN [name], [name], MD   30 mL at 09/15/22 1047   metoprolol succinate  (TOPROL-XL) 24 hr tablet 25 mg  25 mg Oral Daily [name], [name], MD   25 mg at 09/29/22 0945   ondansetron (ZOFRAN) tablet 4 mg  4 mg Oral Q8H PRN Deloria Lair, NP   4 mg at 09/06/22 2320   paliperidone (INVEGA SUSTENNA) injection 156 mg  156 mg Intramuscular Q28 days [name], [name], MD   156 mg at 09/13/22 7829   senna-docusate (Senokot-S) tablet 2 tablet  2 tablet Oral QHS PRN [name], DO   2 tablet at 09/15/22 2110   temazepam (RESTORIL) capsule 15 mg  15 mg Oral QHS [name], [name], MD   15 mg at 09/28/22 2129   tuberculin injection 5 Units  5 Units Intradermal Once Patrecia Pour, NP   5 Units at 09/27/22 1734   ziprasidone (GEODON) injection 20 mg  20 mg Intramuscular Q12H PRN [name], [name], MD   20 mg at 07/08/22 1453    Lab Results:  Results for orders placed or performed during the hospital encounter of 03/19/22 (from the past 48 hour(s))  SARS Coronavirus 2 by RT PCR (hospital order, performed in [facility] hospital lab) *cepheid single result test* Anterior Nasal Swab     Status: None   Collection Time: 09/27/22  5:37 PM   Specimen: Anterior Nasal Swab  Result Value Ref Range   SARS Coronavirus 2 by RT PCR NEGATIVE NEGATIVE    Comment: (NOTE) SARS-CoV-2 target nucleic acids are NOT DETECTED.  The SARS-CoV-2 RNA is generally detectable in upper and lower respiratory specimens during the acute phase of infection. The lowest concentration of SARS-CoV-2 viral copies this assay can detect is 250 copies / mL. A negative result does not preclude SARS-CoV-2 infection and should not be used as the sole basis for treatment or other patient management decisions.  A negative result may occur with improper specimen collection / handling, submission of specimen other than nasopharyngeal swab, presence of viral mutation(s) within the areas targeted by this assay, and inadequate number of viral copies (<250 copies / mL). A negative result must be combined with  clinical observations, patient history, and epidemiological information.  Fact Sheet for Patients:   https://www.patel.info/  Fact Sheet for Healthcare Providers: https://hall.com/  This test is not yet approved or  cleared by the [place] FDA and has  been authorized for detection and/or diagnosis of SARS-CoV-2 by FDA under an Emergency Use Authorization (EUA).  This EUA will remain in effect (meaning this test can be used) for the duration of the COVID-19 declaration under Section 564(b)(1) of the Act, 21 U.S.C. section 360bbb-3(b)(1), unless the authorization is terminated or revoked sooner.  Performed at [facility], [address] [place], [place] [address]     Blood Alcohol level:  Lab Results  Component Value Date   [facility] <10 07/20/2021    Metabolic Disorder Labs: Lab Results  Component Value Date   HGBA1C 5.5 04/10/2022   MPG 111.15 04/10/2022   No results found for: "PROLACTIN" Lab Results  Component Value Date   CHOL 167 11/20/2021   TRIG 107 11/20/2021   HDL 26 (L) 11/20/2021   CHOLHDL 6.4 11/20/2021   VLDL 21 11/20/2021   LDLCALC 120 (H) 11/20/2021    Physical Findings: AIMS: Facial and Oral Movements Muscles of Facial Expression: None, normal Lips and Perioral Area: None, normal Jaw: None, normal Tongue: None, normal,Extremity Movements Upper (arms, wrists, hands, fingers): None, normal Lower (legs, knees, ankles, toes): None, normal, Trunk Movements Neck, shoulders, hips: None, normal, Overall Severity Severity of abnormal movements (highest score from questions above): None, normal Incapacitation due to abnormal movements: None, normal Patient's awareness of abnormal movements (rate only patient's report): No Awareness, Dental Status Current problems with teeth and/or dentures?: No Does patient usually wear dentures?: No  CIWA:    COWS:     Musculoskeletal: Strength & Muscle Tone:  within normal limits Gait & Station: normal Patient leans: N/A  Psychiatric Specialty Exam:  Presentation  General Appearance:  Appropriate for Environment; Casual; Neat; Well Groomed  Eye Contact: Fair  Speech: Slow  Speech Volume: Decreased  Handedness: Left   Mood and Affect  Mood: Anxious  Affect: Congruent   Thought Process  Thought Processes: Goal Directed  Descriptions of Associations:Circumstantial  Orientation:Partial  Thought Content:Scattered  History of Schizophrenia/Schizoaffective disorder:Yes  Duration of Psychotic Symptoms:Greater than six months  Hallucinations:No data recorded Ideas of Reference:None  Suicidal Thoughts:No data recorded Homicidal Thoughts:No data recorded  Sensorium  Memory: Remote Good  Judgment: Fair  Insight: Fair   Chartered certified accountant: Fair  Attention Span: Fair  Recall: Fair  Fund of Knowledge: Fair  Language: Fair   Psychomotor Activity  Psychomotor Activity:No data recorded  Assets  Assets:No data recorded  Sleep  Sleep:No data recorded    Blood pressure 93/77, pulse (!) 102, temperature 97.8 F (36.6 C), temperature source Oral, resp. rate 18, height 5\' 8"  (1.727 m), weight 85.8 kg, SpO2 99 %. Body mass index is 28.76 kg/m.   Treatment Plan Summary: Daily contact with patient to assess and evaluate symptoms and progress in treatment, Medication management, and Plan continue current medications.  , DO 09/29/2022, 12:15 PM

## 2022-09-29 NOTE — Plan of Care (Signed)
Pt denies anxiety/depression at this time. Pt denies SI/HI/AVH or pain at this time. Pt is calm and cooperative. Pt is medication compliant. Pt provided with support and encouragement. Pt monitored q15 minutes for safety per unit policy. Plan of care ongoing.    Problem: Education: Goal: Ability to state activities that reduce stress will improve Outcome: Not Progressing   Problem: Coping: Goal: Ability to identify and develop effective coping behavior will improve Outcome: Not Progressing

## 2022-09-30 NOTE — Progress Notes (Signed)
[facility] MD Progress Note  09/30/2022 11:55 AM Adam [name]  MRN:  [id number] Subjective: Adam [name] is seen on rounds.  He has been pleasant and cooperative.  No complaints.  No side effects.  Mood and affect are stable.  Principal Problem: Schizophrenia, chronic condition with acute exacerbation ([place]) Diagnosis: Principal Problem:   Schizophrenia, chronic condition with acute exacerbation ([name])  Total Time spent with patient: 15 minutes  Past Psychiatric History: History of CVA and schizophrenia.  Past Medical History:  Past Medical History:  Diagnosis Date   Myocardial infarction ([place])    Schizophrenia ([place])    Stroke [facility])    History reviewed. No pertinent surgical history. Family History: History reviewed. No pertinent family history.  Social History:  Social History   Substance and Sexual Activity  Alcohol Use Not Currently     Social History   Substance and Sexual Activity  Drug Use Not Currently    Social History   Socioeconomic History   Marital status: Single    Spouse name: Not on file   Number of children: Not on file   Years of education: Not on file   Highest education level: Not on file  Occupational History   Not on file  Tobacco Use   Smoking status: Never    Passive exposure: Never   Smokeless tobacco: Never  Vaping Use   Vaping Use: Unknown  Substance and Sexual Activity   Alcohol use: Not Currently   Drug use: Not Currently   Sexual activity: Not Currently  Other Topics Concern   Not on file  Social History Narrative   Not on file   Social Determinants of Health   Financial Resource Strain: Not on file  Food Insecurity: Not on file  Transportation Needs: Not on file  Physical Activity: Not on file  Stress: Not on file  Social Connections: Not on file   Additional Social History:  Specify valuables returned:  (none)                      Sleep: Good  Appetite:  Good  Current Medications: Current Facility-Administered  Medications  Medication Dose Route Frequency Provider Last Rate Last Admin   acetaminophen (TYLENOL) tablet 650 mg  650 mg Oral Q6H PRN [name], [name], MD   650 mg at 09/09/22 1813   alum & mag hydroxide-simeth (MAALOX/MYLANTA) 200-200-20 MG/5ML suspension 30 mL  30 mL Oral Q4H PRN [name], [name], MD   30 mL at 04/26/22 9326   aspirin EC tablet 81 mg  81 mg Oral Daily [name], [name], MD   81 mg at 09/30/22 0826   atorvastatin (LIPITOR) tablet 20 mg  20 mg Oral Daily [name], [name], MD   20 mg at 09/30/22 0826   atropine 1 % ophthalmic solution 1 drop  1 drop Sublingual QID [name], MD   1 drop at 09/30/22 0826   benztropine (COGENTIN) tablet 0.5 mg  0.5 mg Oral BID [name], [name], MD   0.5 mg at 09/30/22 0826   clonazePAM (KLONOPIN) tablet 1 mg  1 mg Oral TID PRN He, [name], MD   1 mg at 09/28/22 2130   cloZAPine (CLOZARIL) tablet 300 mg  300 mg Oral QHS [name], [name], MD   300 mg at 09/29/22 2100   diphenhydrAMINE (BENADRYL) capsule 50 mg  50 mg Oral Q6H PRN [name], [name], MD   50 mg at 09/17/22 1532   Or   diphenhydrAMINE (BENADRYL) injection  50 mg  50 mg Intramuscular Q6H PRN [name], [name], MD       feeding supplement (ENSURE ENLIVE / ENSURE PLUS) liquid 237 mL  237 mL Oral TID BM [name], [name], MD   237 mL at 09/30/22 1008   gabapentin (NEURONTIN) capsule 300 mg  300 mg Oral TID [name], [name], MD   300 mg at 09/30/22 1007   haloperidol (HALDOL) tablet 2 mg  2 mg Oral TID [name], [name], MD   2 mg at 09/30/22 1007   haloperidol (HALDOL) tablet 5 mg  5 mg Oral Q6H PRN [name], [name], MD   5 mg at 09/17/22 1532   Or   haloperidol lactate (HALDOL) injection 5 mg  5 mg Intramuscular Q6H PRN [name], [name], MD       lisinopril (ZESTRIL) tablet 5 mg  5 mg Oral Daily [name], [name], MD   5 mg at 09/30/22 1610   magnesium hydroxide (MILK OF MAGNESIA) suspension 30 mL  30 mL Oral Daily PRN [name], [name], MD   30 mL at 09/15/22 1047   metoprolol succinate (TOPROL-XL) 24 hr tablet  25 mg  25 mg Oral Daily [name], [name], MD   25 mg at 09/30/22 0826   ondansetron (ZOFRAN) tablet 4 mg  4 mg Oral Q8H PRN Lerry Liner M, NP   4 mg at 09/06/22 2320   paliperidone (INVEGA SUSTENNA) injection 156 mg  156 mg Intramuscular Q28 days [name], [name], MD   156 mg at 09/13/22 9604   senna-docusate (Senokot-S) tablet 2 tablet  2 tablet Oral QHS PRN Sarina Ill, DO   2 tablet at 09/15/22 2110   temazepam (RESTORIL) capsule 15 mg  15 mg Oral QHS [name], [name], MD   15 mg at 09/29/22 2100   ziprasidone (GEODON) injection 20 mg  20 mg Intramuscular Q12H PRN [name], [name], MD   20 mg at 07/08/22 1453    Lab Results: No results found for this or any previous visit (from the past 48 hour(s)).  Blood Alcohol level:  Lab Results  Component Value Date   ETH <10 07/20/2021    Metabolic Disorder Labs: Lab Results  Component Value Date   HGBA1C 5.5 04/10/2022   MPG 111.15 04/10/2022   No results found for: "PROLACTIN" Lab Results  Component Value Date   CHOL 167 11/20/2021   TRIG 107 11/20/2021   HDL 26 (L) 11/20/2021   CHOLHDL 6.4 11/20/2021   VLDL 21 11/20/2021   LDLCALC 120 (H) 11/20/2021    Physical Findings: AIMS: Facial and Oral Movements Muscles of Facial Expression: None, normal Lips and Perioral Area: None, normal Jaw: None, normal Tongue: None, normal,Extremity Movements Upper (arms, wrists, hands, fingers): None, normal Lower (legs, knees, ankles, toes): None, normal, Trunk Movements Neck, shoulders, hips: None, normal, Overall Severity Severity of abnormal movements (highest score from questions above): None, normal Incapacitation due to abnormal movements: None, normal Patient's awareness of abnormal movements (rate only patient's report): No Awareness, Dental Status Current problems with teeth and/or dentures?: No Does patient usually wear dentures?: No  CIWA:    COWS:     Musculoskeletal: Strength & Muscle Tone: within normal  limits Gait & Station: normal Patient leans: N/A  Psychiatric Specialty Exam:  Presentation  General Appearance:  Appropriate for Environment; Casual; Neat; Well Groomed  Eye Contact: Fair  Speech: Slow  Speech Volume: Decreased  Handedness: Left   Mood and Affect  Mood: Anxious  Affect: Congruent   Thought Process  Thought Processes: Goal Directed  Descriptions of Associations:Circumstantial  Orientation:Partial  Thought Content:Scattered  History of Schizophrenia/Schizoaffective disorder:Yes  Duration of Psychotic Symptoms:Greater than six months  Hallucinations:No data recorded Ideas of Reference:None  Suicidal Thoughts:No data recorded Homicidal Thoughts:No data recorded  Sensorium  Memory: Remote Good  Judgment: Fair  Insight: Fair   Chartered certified accountant: Fair  Attention Span: Fair  Recall: Fair  Fund of Knowledge: Fair  Language: Fair   Psychomotor Activity  Psychomotor Activity:No data recorded  Assets  Assets:No data recorded  Sleep  Sleep:No data recorded    Blood pressure 111/83, pulse 90, temperature 98 F (36.7 C), temperature source Oral, resp. rate 18, height 5\' 8"  (1.727 m), weight 85.8 kg, SpO2 99 %. Body mass index is 28.76 kg/m.   Treatment Plan Summary: Daily contact with patient to assess and evaluate symptoms and progress in treatment, Medication management, and Plan continue current medications.  , DO 09/30/2022, 11:55 AM

## 2022-09-30 NOTE — Plan of Care (Signed)
D: Patient alert and oriented. Patient denies pain. Patient denies anxiety and depression. Patient denies SI/HI/AVH. Patient has frequently came to the nurses station demanding that his name be changed on his door to other names such as "Nicole Kindred Iommi" and "Madonna". Patient frequently observed around the unit watching television in the dayroom and walking around the unit.  A: Scheduled medications administered to patient, per MD orders.  Support and encouragement provided to patient.  Q15 minute safety checks maintained.   R: Patient compliant with medication administration and treatment plan. No adverse drug reactions noted. Patient remains safe on the unit at this time.  Problem: Education: Goal: Knowledge of General Education information will improve Description: Including pain rating scale, medication(s)/side effects and non-pharmacologic comfort measures Outcome: Progressing   Problem: Clinical Measurements: Goal: Ability to maintain clinical measurements within normal limits will improve Outcome: Progressing   Problem: Nutrition: Goal: Adequate nutrition will be maintained Outcome: Progressing   Problem: Safety: Goal: Ability to remain free from injury will improve Outcome: Progressing

## 2022-09-30 NOTE — Group Note (Signed)
LCSW Group Therapy Note   Group Date: 09/30/2022 Start Time: 1300 End Time: 1400   Type of Therapy and Topic:  Group Therapy: Boundaries  Participation Level:  None  Description of Group: This group will address the use of boundaries in their personal lives. Patients will explore why boundaries are important, the difference between healthy and unhealthy boundaries, and negative and postive outcomes of different boundaries and will look at how boundaries can be crossed.  Patients will be encouraged to identify current boundaries in their own lives and identify what kind of boundary is being set. Facilitators will guide patients in utilizing problem-solving interventions to address and correct types boundaries being used and to address when no boundary is being used. Understanding and applying boundaries will be explored and addressed for obtaining and maintaining a balanced life. Patients will be encouraged to explore ways to assertively make their boundaries and needs known to significant others in their lives, using other group members and facilitator for role play, support, and feedback.  Therapeutic Goals:  1.  Patient will identify areas in their life where setting clear boundaries could be  used to improve their life.  2.  Patient will identify signs/triggers that a boundary is not being respected. 3.  Patient will identify two ways to set boundaries in order to achieve balance in  their lives: 4.  Patient will demonstrate ability to communicate their needs and set boundaries  through discussion and/or role plays  Summary of Patient Progress:  Patient was present/active throughout the session and proved open to feedback from [place] and peers. Patient  unable to engage in group discussion, however stated "Adam [name]" and "Adam [name]" at random in group.  Therapeutic Modalities:   Cognitive Behavioral Therapy Solution-Focused Therapy  Rozann Lesches, LCSWA 09/30/2022  3:00 PM

## 2022-10-01 NOTE — Progress Notes (Signed)
Patient alert and oriented x 3, with periods of confusion to situation, his affect is blunted thoughts are disorganized. Patient was offered emotional support, he was complaint with medication, 15 minutes safety checks maintained will continue to monitor.

## 2022-10-01 NOTE — Progress Notes (Signed)
Recreation Therapy Notes   Date: 10/01/2022  Time: 10:20 am    Location: Courtyard    Behavioral response: N/A   Intervention Topic: Wellness    Discussion/Intervention: Patient refused to attend group.   Clinical Observations/Feedback:  Patient refused to attend group.    Saniyya Gau LRT/CTRS         Forestine Macho 10/01/2022 12:18 PM

## 2022-10-01 NOTE — Plan of Care (Signed)
D: Patient alert and oriented. Patient denies pain. Patient denies anxiety and depression. Patient denies SI/HI/AVH. Patient frequently observed around the unit, walking around the hallways. After dinner patient patient mood became irritable. Patient is shouting "no" and "[place]op[place]" pacing back and forth from the patient phone and back to his room.  A: Scheduled medications administered to patient, per MD orders.  Support and encouragement provided to patient.  Q15 minute safety checks maintained.   R: Patient compliant with medication administration and treatment plan. No adverse drug reactions noted. Patient remains safe on the unit at this time.  Problem: Education: Goal: Knowledge of General Education information will improve Description: Including pain rating scale, medication(s)/side effects and non-pharmacologic comfort measures Outcome: Progressing   Problem: Clinical Measurements: Goal: Ability to maintain clinical measurements within normal limits will improve Outcome: Progressing Goal: Will remain free from infection Outcome: Progressing   Problem: Nutrition: Goal: Adequate nutrition will be maintained Outcome: Progressing   Problem: Activity: Goal: Interest or engagement in leisure activities will improve Outcome: Progressing

## 2022-10-01 NOTE — Progress Notes (Signed)
[facility] MD Progress Note  10/01/2022 10:12 AM Davinci Glotfelty  MRN:  [id number]  Subjective: Patient seen on rounds today after notes were reviewed and discussed in the morning meeting. He continues to remain stable and is compliant with current treatment plan. Patient moves around unit without any issues. Still minimally verbal when asked questions. Today patient only responding with moans and groans, denies pain though, nods head no. Appetite and sleep appropriate. Denies suicidal/homicidal ideations and hallucinations  Principal Problem: Schizophrenia, chronic condition with acute exacerbation ([facility]) Diagnosis: Principal Problem:   Schizophrenia, chronic condition with acute exacerbation ([facility])  Total Time spent with patient: 30 minutes  Past Psychiatric History: Past history of schizophrenia which has been stabilized on current medication including clozapine.  History of major stroke causing dense aphasia  Past Medical History:  Past Medical History:  Diagnosis Date   Myocardial infarction ([facility])    Schizophrenia ([facility])    Stroke [facility])    History reviewed. No pertinent surgical history. Family History: History reviewed. No pertinent family history. Family Psychiatric  History: See previous Social History:  Social History   Substance and Sexual Activity  Alcohol Use Not Currently     Social History   Substance and Sexual Activity  Drug Use Not Currently    Social History   Socioeconomic History   Marital status: Single    Spouse name: Not on file   Number of children: Not on file   Years of education: Not on file   Highest education level: Not on file  Occupational History   Not on file  Tobacco Use   Smoking status: Never    Passive exposure: Never   Smokeless tobacco: Never  Vaping Use   Vaping Use: Unknown  Substance and Sexual Activity   Alcohol use: Not Currently   Drug use: Not Currently   Sexual activity: Not Currently  Other Topics Concern   Not on file  Social  History Narrative   Not on file   Social Determinants of Health   Financial Resource Strain: Not on file  Food Insecurity: Not on file  Transportation Needs: Not on file  Physical Activity: Not on file  Stress: Not on file  Social Connections: Not on file   Additional Social History:  Specify valuables returned:  (none)                      Sleep: Fair  Appetite:  Fair  Current Medications: Current Facility-Administered Medications  Medication Dose Route Frequency Provider Last Rate Last Admin   acetaminophen (TYLENOL) tablet 650 mg  650 mg Oral Q6H PRN [name], [name], MD   650 mg at 09/09/22 1813   alum & mag hydroxide-simeth (MAALOX/MYLANTA) 200-200-20 MG/5ML suspension 30 mL  30 mL Oral Q4H PRN [name], [name], MD   30 mL at 04/26/22 6789   aspirin EC tablet 81 mg  81 mg Oral Daily [name], [name], MD   81 mg at 10/01/22 0948   atorvastatin (LIPITOR) tablet 20 mg  20 mg Oral Daily [name], [name], MD   20 mg at 10/01/22 0947   atropine 1 % ophthalmic solution 1 drop  1 drop Sublingual QID [name], MD   1 drop at 10/01/22 0950   benztropine (COGENTIN) tablet 0.5 mg  0.5 mg Oral BID [name], [name], MD   0.5 mg at 10/01/22 0948   clonazePAM (KLONOPIN) tablet 1 mg  1 mg Oral TID PRN He, [name], MD  1 mg at 09/28/22 2130   cloZAPine (CLOZARIL) tablet 300 mg  300 mg Oral QHS [name], [name], MD   300 mg at 09/30/22 2119   diphenhydrAMINE (BENADRYL) capsule 50 mg  50 mg Oral Q6H PRN [name], [name], MD   50 mg at 09/30/22 2119   Or   diphenhydrAMINE (BENADRYL) injection 50 mg  50 mg Intramuscular Q6H PRN [name], [name], MD       feeding supplement (ENSURE ENLIVE / ENSURE PLUS) liquid 237 mL  237 mL Oral TID BM [name], [name], MD   237 mL at 10/01/22 0948   gabapentin (NEURONTIN) capsule 300 mg  300 mg Oral TID [name], [name], MD   300 mg at 10/01/22 0947   haloperidol (HALDOL) tablet 2 mg  2 mg Oral TID [name], [name], MD   2 mg at 10/01/22 0947   haloperidol  (HALDOL) tablet 5 mg  5 mg Oral Q6H PRN [name], [name], MD   5 mg at 09/17/22 1532   Or   haloperidol lactate (HALDOL) injection 5 mg  5 mg Intramuscular Q6H PRN [name], [name], MD       lisinopril (ZESTRIL) tablet 5 mg  5 mg Oral Daily [name], [name], MD   5 mg at 10/01/22 0948   magnesium hydroxide (MILK OF MAGNESIA) suspension 30 mL  30 mL Oral Daily PRN [name], [name], MD   30 mL at 09/15/22 1047   metoprolol succinate (TOPROL-XL) 24 hr tablet 25 mg  25 mg Oral Daily [name], [name], MD   25 mg at 10/01/22 0948   ondansetron (ZOFRAN) tablet 4 mg  4 mg Oral Q8H PRN Lerry Liner M, NP   4 mg at 09/06/22 2320   paliperidone (INVEGA SUSTENNA) injection 156 mg  156 mg Intramuscular Q28 days [name], [name], MD   156 mg at 09/13/22 2440   senna-docusate (Senokot-S) tablet 2 tablet  2 tablet Oral QHS PRN Sarina Ill, DO   2 tablet at 09/15/22 2110   temazepam (RESTORIL) capsule 15 mg  15 mg Oral QHS [name], [name], MD   15 mg at 09/30/22 2119   ziprasidone (GEODON) injection 20 mg  20 mg Intramuscular Q12H PRN [name], [name], MD   20 mg at 07/08/22 1453    Lab Results: No results found for this or any previous visit (from the past 48 hour(s)).   Blood Alcohol level:  Lab Results  Component Value Date   ETH <10 07/20/2021    Metabolic Disorder Labs: Lab Results  Component Value Date   HGBA1C 5.5 04/10/2022   MPG 111.15 04/10/2022   No results found for: "PROLACTIN" Lab Results  Component Value Date   CHOL 167 11/20/2021   TRIG 107 11/20/2021   HDL 26 (L) 11/20/2021   CHOLHDL 6.4 11/20/2021   VLDL 21 11/20/2021   LDLCALC 120 (H) 11/20/2021    Physical Findings: AIMS: Facial and Oral Movements Muscles of Facial Expression: None, normal Lips and Perioral Area: None, normal Jaw: None, normal Tongue: None, normal,Extremity Movements Upper (arms, wrists, hands, fingers): None, normal Lower (legs, knees, ankles, toes): None, normal, Trunk Movements Neck,  shoulders, hips: None, normal, Overall Severity Severity of abnormal movements (highest score from questions above): None, normal Incapacitation due to abnormal movements: None, normal Patient's awareness of abnormal movements (rate only patient's report): No Awareness, Dental Status Current problems with teeth and/or dentures?: No Does patient usually wear dentures?: No  CIWA:  COWS:     Musculoskeletal: Strength & Muscle Tone: within normal limits Gait & Station: normal Patient leans: N/A  Psychiatric Specialty Exam: Physical Exam Vitals and nursing note reviewed.  Constitutional:      Appearance: Normal appearance.  HENT:     Head: Normocephalic and atraumatic.     Nose: Nose normal.  Pulmonary:     Effort: Pulmonary effort is normal.  Musculoskeletal:        General: Normal range of motion.     Cervical back: Normal range of motion.  Neurological:     General: No focal deficit present.     Mental Status: He is alert and oriented to person, place, and time.  Psychiatric:        Attention and Perception: He is inattentive.        Mood and Affect: Affect is blunt.        Speech: Speech normal.        Behavior: Behavior normal. Behavior is cooperative.        Thought Content: Thought content normal.        Cognition and Memory: Cognition is impaired.        Judgment: Judgment is impulsive.     Review of Systems  Unable to perform ROS: Language  Psychiatric/Behavioral:  The patient is nervous/anxious.   All other systems reviewed and are negative.   Blood pressure 128/87, pulse 87, temperature 98 F (36.7 C), temperature source Oral, resp. rate 18, height 5\' 8"  (1.727 m), weight 85.8 kg, SpO2 99 %.Body mass index is 28.76 kg/m.  General Appearance: Casual  Eye Contact:  Good  Speech:  Garbled  Volume:  Normal  Mood:  Euthymic  Affect:  Congruent  Thought Process:  Coherent  Orientation:  Full (Time, Place, and Person)  Thought Content:  UTA, difficult to  understand  Suicidal Thoughts:  No  Homicidal Thoughts:  No  Memory:  Immediate;   Fair Recent;   Fair Remote;   Fair  Judgement:  Fair  Insight:  UTA  Psychomotor Activity:  Normal  Concentration:  Concentration: Fair and Attention Span: Fair  Recall:  [place] of Knowledge:  UTA  Language:  Poor  Akathisia:  No  Handed:  Right  AIMS (if indicated):     Assets:  Leisure Time Physical Health Resilience Social Support  ADL's:  Intact  Cognition:  Impaired,  Mild  Sleep:  Number of Hours: 8.5      Physical Exam: Physical Exam Vitals and nursing note reviewed.  Constitutional:      Appearance: Normal appearance.  HENT:     Head: Normocephalic and atraumatic.     Nose: Nose normal.  Pulmonary:     Effort: Pulmonary effort is normal.  Musculoskeletal:        General: Normal range of motion.     Cervical back: Normal range of motion.  Neurological:     General: No focal deficit present.     Mental Status: He is alert and oriented to person, place, and time.  Psychiatric:        Attention and Perception: He is inattentive.        Mood and Affect: Affect is blunt.        Speech: Speech normal.        Behavior: Behavior normal. Behavior is cooperative.        Thought Content: Thought content normal.        Cognition and Memory: Cognition is impaired.  Judgment: Judgment is impulsive.    Review of Systems  Unable to perform ROS: Language  Psychiatric/Behavioral:  The patient is nervous/anxious.   All other systems reviewed and are negative.  Blood pressure 128/87, pulse 87, temperature 98 F (36.7 C), temperature source Oral, resp. rate 18, height 5\' 8"  (1.727 m), weight 85.8 kg, SpO2 99 %. Body mass index is 28.76 kg/m.   Treatment Plan Summary: Medication management and Plan no change to medication.  Offered support for the patient and encouraged him to let know if there is something specific we can do to help.  Potential placement found for patient -  performed TB skin test waiting for results and COVID testing was negative. Schizophrenia: Haldol 2 mg TID Clozaril 300 mg at bedtime Invega 156 mg monthly  Anxiety Gabapentin 300 mg BID Klonopin 1 mg TID  Insomnia Restoril 15 mg daily at bedtime  EPS: Cogentin 0.5 mg BID  [place], NP 10/01/2022, 10:12 AM

## 2022-10-02 NOTE — Plan of Care (Signed)
D- Patient alert and oriented to person, place, and situation. Patient presents in a pleasant mood on assessment stating that he slept ok last night and had no complaints to voice to this Probation officer. Patient has been fixated on Nicole Kindred Iommi once again today. Patient denies SI, HI, AVH, and pain at this time. Patient also denies any signs/symptoms of depression and anxiety. Patient had no stated goals for today.  A- Scheduled medications administered to patient, per MD orders. Support and encouragement provided.  Routine safety checks conducted every 15 minutes.  Patient informed to notify staff with problems or concerns.  R- No adverse drug reactions noted. Patient contracts for safety at this time. Patient compliant with medications and treatment plan. Patient receptive, calm, and cooperative. Patient interacts well with others on the unit. Patient remains safe at this time.  Problem: Education: Goal: Ability to state activities that reduce stress will improve Outcome: Progressing   Problem: Coping: Goal: Ability to identify and develop effective coping behavior will improve Outcome: Progressing   Problem: Self-Concept: Goal: Ability to identify factors that promote anxiety will improve Outcome: Progressing Goal: Level of anxiety will decrease Outcome: Progressing Goal: Ability to modify response to factors that promote anxiety will improve Outcome: Progressing   Problem: Education: Goal: Knowledge of General Education information will improve Description: Including pain rating scale, medication(s)/side effects and non-pharmacologic comfort measures Outcome: Progressing   Problem: Health Behavior/Discharge Planning: Goal: Ability to manage health-related needs will improve Outcome: Progressing   Problem: Clinical Measurements: Goal: Ability to maintain clinical measurements within normal limits will improve Outcome: Progressing Goal: Will remain free from infection Outcome:  Progressing Goal: Diagnostic test results will improve Outcome: Progressing Goal: Respiratory complications will improve Outcome: Progressing Goal: Cardiovascular complication will be avoided Outcome: Progressing   Problem: Activity: Goal: Risk for activity intolerance will decrease Outcome: Progressing   Problem: Nutrition: Goal: Adequate nutrition will be maintained Outcome: Progressing   Problem: Coping: Goal: Level of anxiety will decrease Outcome: Progressing   Problem: Elimination: Goal: Will not experience complications related to bowel motility Outcome: Progressing Goal: Will not experience complications related to urinary retention Outcome: Progressing   Problem: Pain Managment: Goal: General experience of comfort will improve Outcome: Progressing   Problem: Safety: Goal: Ability to remain free from injury will improve Outcome: Progressing   Problem: Skin Integrity: Goal: Risk for impaired skin integrity will decrease Outcome: Progressing   Problem: Activity: Goal: Interest or engagement in leisure activities will improve Outcome: Progressing   Problem: Coping: Goal: Coping ability will improve Outcome: Progressing   Problem: Education: Goal: Will be free of psychotic symptoms Outcome: Progressing

## 2022-10-02 NOTE — [facility] Counselor (Signed)
CSW spoke with the [place] at [employer] [employer]  CSW asked if it would be possible for the patient's mother to be a part of the meeting that is held weekly to address the patient's placement status.  CSW asked if it would be possible for the mother to be invited to this meeting.  Mendel Ryder expressed that mother would be able to.  Mendel Ryder reported that she plans on calling the mother and will invite her.  She reports that if she does not reach mother this afternoon she will reach out "first thing in the morning".   Mendel Ryder also wanted to discuss the patient's finances.  CSW reviewed email that was sent 09/14/22 at 4:24PM.  CSW explained that no additional details have been found out at this time.  Mendel Ryder and CSW expressed that this would be a good time to explore patient's finances.  Assunta Curtis, MSW, LCSW 10/02/2022 4:20 PM

## 2022-10-02 NOTE — Progress Notes (Signed)
Recreation Therapy Notes  Date: 10/02/2022   Time: 10:20 am   Location: Craft room    Behavioral response: N/A   Intervention Topic: Values     Discussion/Intervention: Patient refused to attend group.   Clinical Observations/Feedback:  Patient refused to attend group.   Melaina Howerton LRT/CTRS        Bradey Luzier 10/02/2022 10:56 AM

## 2022-10-02 NOTE — Progress Notes (Signed)
[facility] MD Progress Note  10/02/2022 9:46 AM Adam [name]  MRN:  [id number]  Subjective: Patient seen on rounds today after notes were reviewed and discussed in the morning meeting. He continues to remain stable and is compliant with current treatment plan. Patient moves around unit without any issues. Today, he would not look at this team as he was busy on his phone.  He tends to start yelling or singing loudly  in the late afternoon. Sleep and appetite are good, no apparent side effects from his medications.  Principal Problem: Schizophrenia, chronic condition with acute exacerbation (HCC) Diagnosis: Principal Problem:   Schizophrenia, chronic condition with acute exacerbation (HCC)  Total Time spent with patient: 30 minutes  Past Psychiatric History: Past history of schizophrenia which has been stabilized on current medication including clozapine.  History of major stroke causing dense aphasia  Past Medical History:  Past Medical History:  Diagnosis Date   Myocardial infarction (HCC)    Schizophrenia (HCC)    Stroke [facility])    History reviewed. No pertinent surgical history. Family History: History reviewed. No pertinent family history. Family Psychiatric  History: See previous Social History:  Social History   Substance and Sexual Activity  Alcohol Use Not Currently     Social History   Substance and Sexual Activity  Drug Use Not Currently    Social History   Socioeconomic History   Marital status: Single    Spouse name: Not on file   Number of children: Not on file   Years of education: Not on file   Highest education level: Not on file  Occupational History   Not on file  Tobacco Use   Smoking status: Never    Passive exposure: Never   Smokeless tobacco: Never  Vaping Use   Vaping Use: Unknown  Substance and Sexual Activity   Alcohol use: Not Currently   Drug use: Not Currently   Sexual activity: Not Currently  Other Topics Concern   Not on file  Social History  Narrative   Not on file   Social Determinants of Health   Financial Resource Strain: Not on file  Food Insecurity: Not on file  Transportation Needs: Not on file  Physical Activity: Not on file  Stress: Not on file  Social Connections: Not on file   Additional Social History:  Specify valuables returned:  (none)                      Sleep: Fair  Appetite:  Fair  Current Medications: Current Facility-Administered Medications  Medication Dose Route Frequency Provider Last Rate Last Admin   acetaminophen (TYLENOL) tablet 650 mg  650 mg Oral Q6H PRN [name], [name], MD   650 mg at 09/09/22 1813   alum & mag hydroxide-simeth (MAALOX/MYLANTA) 200-200-20 MG/5ML suspension 30 mL  30 mL Oral Q4H PRN [name], [name], MD   30 mL at 04/26/22 6269   aspirin EC tablet 81 mg  81 mg Oral Daily [name], [name], MD   81 mg at 10/02/22 0810   atorvastatin (LIPITOR) tablet 20 mg  20 mg Oral Daily [name], [name], MD   20 mg at 10/02/22 0810   atropine 1 % ophthalmic solution 1 drop  1 drop Sublingual QID [name], MD   1 drop at 10/02/22 0810   benztropine (COGENTIN) tablet 0.5 mg  0.5 mg Oral BID [name], [name], MD   0.5 mg at 10/02/22 0810   clonazePAM (KLONOPIN) tablet 1  mg  1 mg Oral TID PRN He, [name], MD   1 mg at 09/28/22 2130   cloZAPine (CLOZARIL) tablet 300 mg  300 mg Oral QHS [name], [name], MD   300 mg at 10/01/22 2049   diphenhydrAMINE (BENADRYL) capsule 50 mg  50 mg Oral Q6H PRN [name], [name], MD   50 mg at 09/30/22 2119   Or   diphenhydrAMINE (BENADRYL) injection 50 mg  50 mg Intramuscular Q6H PRN [name], [name], MD       feeding supplement (ENSURE ENLIVE / ENSURE PLUS) liquid 237 mL  237 mL Oral TID BM [name], [name], MD   237 mL at 10/02/22 0944   gabapentin (NEURONTIN) capsule 300 mg  300 mg Oral TID [name], [name], MD   300 mg at 10/02/22 0944   haloperidol (HALDOL) tablet 2 mg  2 mg Oral TID [name], [name], MD   2 mg at 10/02/22 0944   haloperidol (HALDOL)  tablet 5 mg  5 mg Oral Q6H PRN [name], [name], MD   5 mg at 09/17/22 1532   Or   haloperidol lactate (HALDOL) injection 5 mg  5 mg Intramuscular Q6H PRN [name], [name], MD       lisinopril (ZESTRIL) tablet 5 mg  5 mg Oral Daily [name], [name], MD   5 mg at 10/02/22 0810   magnesium hydroxide (MILK OF MAGNESIA) suspension 30 mL  30 mL Oral Daily PRN [name], [name], MD   30 mL at 09/15/22 1047   metoprolol succinate (TOPROL-XL) 24 hr tablet 25 mg  25 mg Oral Daily [name], [name], MD   25 mg at 10/02/22 0810   ondansetron (ZOFRAN) tablet 4 mg  4 mg Oral Q8H PRN Lerry Liner M, NP   4 mg at 09/06/22 2320   paliperidone (INVEGA SUSTENNA) injection 156 mg  156 mg Intramuscular Q28 days [name], [name], MD   156 mg at 09/13/22 0737   senna-docusate (Senokot-S) tablet 2 tablet  2 tablet Oral QHS PRN Sarina Ill, DO   2 tablet at 09/15/22 2110   temazepam (RESTORIL) capsule 15 mg  15 mg Oral QHS [name], [name], MD   15 mg at 10/01/22 2049   ziprasidone (GEODON) injection 20 mg  20 mg Intramuscular Q12H PRN [name], [name], MD   20 mg at 07/08/22 1453    Lab Results: No results found for this or any previous visit (from the past 48 hour(s)).   Blood Alcohol level:  Lab Results  Component Value Date   ETH <10 07/20/2021    Metabolic Disorder Labs: Lab Results  Component Value Date   HGBA1C 5.5 04/10/2022   MPG 111.15 04/10/2022   No results found for: "PROLACTIN" Lab Results  Component Value Date   CHOL 167 11/20/2021   TRIG 107 11/20/2021   HDL 26 (L) 11/20/2021   CHOLHDL 6.4 11/20/2021   VLDL 21 11/20/2021   LDLCALC 120 (H) 11/20/2021    Physical Findings: AIMS: Facial and Oral Movements Muscles of Facial Expression: None, normal Lips and Perioral Area: None, normal Jaw: None, normal Tongue: None, normal,Extremity Movements Upper (arms, wrists, hands, fingers): None, normal Lower (legs, knees, ankles, toes): None, normal, Trunk Movements Neck, shoulders,  hips: None, normal, Overall Severity Severity of abnormal movements (highest score from questions above): None, normal Incapacitation due to abnormal movements: None, normal Patient's awareness of abnormal movements (rate only patient's report): No Awareness, Dental Status Current problems with teeth  and/or dentures?: No Does patient usually wear dentures?: No  CIWA:    COWS:     Musculoskeletal: Strength & Muscle Tone: within normal limits Gait & Station: normal Patient leans: N/A  Psychiatric Specialty Exam: Physical Exam Vitals and nursing note reviewed.  Constitutional:      Appearance: Normal appearance.  HENT:     Head: Normocephalic and atraumatic.     Nose: Nose normal.  Pulmonary:     Effort: Pulmonary effort is normal.  Musculoskeletal:        General: Normal range of motion.     Cervical back: Normal range of motion.  Neurological:     General: No focal deficit present.     Mental Status: He is alert and oriented to person, place, and time.  Psychiatric:        Attention and Perception: He is inattentive.        Mood and Affect: Affect is blunt.        Speech: Speech normal.        Behavior: Behavior normal. Behavior is cooperative.        Thought Content: Thought content normal.        Cognition and Memory: Cognition is impaired.        Judgment: Judgment is impulsive.     Review of Systems  Unable to perform ROS: Language  Psychiatric/Behavioral:  The patient is nervous/anxious.   All other systems reviewed and are negative.   Blood pressure 128/87, pulse 87, temperature 98 F (36.7 C), temperature source Oral, resp. rate 18, height 5\' 8"  (1.727 m), weight 85.8 kg, SpO2 99 %.Body mass index is 28.76 kg/m.  General Appearance: Casual  Eye Contact:  Good  Speech:  Garbled  Volume:  Normal  Mood:  Euthymic  Affect:  Congruent  Thought Process:  Coherent  Orientation:  Full (Time, Place, and Person)  Thought Content:  UTA, difficult to understand   Suicidal Thoughts:  No  Homicidal Thoughts:  No  Memory:  Immediate;   Fair Recent;   Fair Remote;   Fair  Judgement:  Fair  Insight:  UTA  Psychomotor Activity:  Normal  Concentration:  Concentration: Fair and Attention Span: Fair  Recall:  House of Knowledge:  UTA  Language:  Poor  Akathisia:  No  Handed:  Right  AIMS (if indicated):     Assets:  Leisure Time Physical Health Resilience Social Support  ADL's:  Intact  Cognition:  Impaired,  Mild  Sleep:  Number of Hours: 8.5      Physical Exam: Physical Exam Vitals and nursing note reviewed.  Constitutional:      Appearance: Normal appearance.  HENT:     Head: Normocephalic and atraumatic.     Nose: Nose normal.  Pulmonary:     Effort: Pulmonary effort is normal.  Musculoskeletal:        General: Normal range of motion.     Cervical back: Normal range of motion.  Neurological:     General: No focal deficit present.     Mental Status: He is alert and oriented to person, place, and time.  Psychiatric:        Attention and Perception: He is inattentive.        Mood and Affect: Affect is blunt.        Speech: Speech normal.        Behavior: Behavior normal. Behavior is cooperative.        Thought Content: Thought content normal.  Cognition and Memory: Cognition is impaired.        Judgment: Judgment is impulsive.    Review of Systems  Unable to perform ROS: Language  Psychiatric/Behavioral:  The patient is nervous/anxious.   All other systems reviewed and are negative.  Blood pressure 128/87, pulse 87, temperature 98 F (36.7 C), temperature source Oral, resp. rate 18, height 5\' 8"  (1.727 m), weight 85.8 kg, SpO2 99 %. Body mass index is 28.76 kg/m.   Treatment Plan Summary: Medication management and Plan no change to medication.  Offered support for the patient and encouraged him to let know if there is something specific we can do to help.  Potential placement found for patient - performed  TB skin test waiting for results and COVID testing was negative. Schizophrenia: Haldol 2 mg TID Clozaril 300 mg at bedtime Invega 156 mg monthly  Anxiety Gabapentin 300 mg BID Klonopin 1 mg TID  Insomnia Restoril 15 mg daily at bedtime  EPS: Cogentin 0.5 mg BID  [place], NP 10/02/2022, 9:46 AM

## 2022-10-02 NOTE — Group Note (Signed)
[facility] LCSW Group Therapy Note   Group Date: 10/02/2022 Start Time: 1400 End Time: 1500  Type of Therapy/Topic:  Group Therapy:  Feelings about Diagnosis  Participation Level:  None    Description of Group:    This group will allow patients to explore their thoughts and feelings about diagnoses they have received. Patients will be guided to explore their level of understanding and acceptance of these diagnoses. Facilitator will encourage patients to process their thoughts and feelings about the reactions of others to their diagnosis, and will guide patients in identifying ways to discuss their diagnosis with significant others in their lives. This group will be process-oriented, with patients participating in exploration of their own experiences as well as giving and receiving support and challenge from other group members.   Therapeutic Goals: 1. Patient will demonstrate understanding of diagnosis as evidence by identifying two or more symptoms of the disorder:  2. Patient will be able to express two feelings regarding the diagnosis 3. Patient will demonstrate ability to communicate their needs through discussion and/or role plays  Summary of Patient Progress: Patient came into group at the beginning, however, by time the conversation started he got up and left. Pt said Nicole Kindred Iommi a few times and these for the only verbalizations that he made.     Therapeutic Modalities:   Cognitive Behavioral Therapy Brief Therapy Feelings Identification    Shirl Harris, LCSW

## 2022-10-02 NOTE — Progress Notes (Signed)
Patient calm and cooperative during assessment denying SI/HI/AVH. Patient observed interacting appropriately with staff and peers on the unit. Patient compliant with medication administration per MD orders. Pt being monitored Q 15 minutes for safety per unit protocol, remains safe on the unit  

## 2022-10-03 LAB — CBC WITH DIFFERENTIAL/PLATELET
Abs Immature Granulocytes: 0.02 10*3/uL (ref 0.00–0.07)
Basophils Absolute: 0.1 10*3/uL (ref 0.0–0.1)
Basophils Relative: 2 %
Eosinophils Absolute: 0 10*3/uL (ref 0.0–0.5)
Eosinophils Relative: 0 %
HCT: 37.9 % — ABNORMAL LOW (ref 39.0–52.0)
Hemoglobin: 13.3 g/dL (ref 13.0–17.0)
Immature Granulocytes: 0 %
Lymphocytes Relative: 19 %
Lymphs Abs: 0.9 10*3/uL (ref 0.7–4.0)
MCH: 31.7 pg (ref 26.0–34.0)
MCHC: 35.1 g/dL (ref 30.0–36.0)
MCV: 90.5 fL (ref 80.0–100.0)
Monocytes Absolute: 0.4 10*3/uL (ref 0.1–1.0)
Monocytes Relative: 9 %
Neutro Abs: 3.3 10*3/uL (ref 1.7–7.7)
Neutrophils Relative %: 70 %
Platelets: 154 10*3/uL (ref 150–400)
RBC: 4.19 MIL/uL — ABNORMAL LOW (ref 4.22–5.81)
RDW: 13.3 % (ref 11.5–15.5)
WBC: 4.7 10*3/uL (ref 4.0–10.5)
nRBC: 0 % (ref 0.0–0.2)

## 2022-10-03 NOTE — Group Note (Signed)
[facility] LCSW Group Therapy Note   Group Date: 10/03/2022 Start Time: 1300 End Time: 1400   Type of Therapy/Topic:  Group Therapy:  Emotion Regulation  Participation Level:  Did Not Attend    Description of Group:    The purpose of this group is to assist patients in learning to regulate negative emotions and experience positive emotions. Patients will be guided to discuss ways in which they have been vulnerable to their negative emotions. These vulnerabilities will be juxtaposed with experiences of positive emotions or situations, and patients challenged to use positive emotions to combat negative ones. Special emphasis will be placed on coping with negative emotions in conflict situations, and patients will process healthy conflict resolution skills.  Therapeutic Goals: Patient will identify two positive emotions or experiences to reflect on in order to balance out negative emotions:  Patient will label two or more emotions that they find the most difficult to experience:  Patient will be able to demonstrate positive conflict resolution skills through discussion or role plays:   Summary of Patient Progress: Patient declined to attend despite personal invitation.   Therapeutic Modalities:   Cognitive Behavioral Therapy Feelings Identification Dialectical Behavioral Therapy   Renato Spellman R Soumya Colson, LCSW 

## 2022-10-03 NOTE — Progress Notes (Signed)
Recreation Therapy Notes    Date: 10/03/2022   Time: 10:10 am   Location: Craft room    Behavioral response: N/A   Intervention Topic: Honesty    Discussion/Intervention: Patient refused to attend group.   Clinical Observations/Feedback:  Patient refused to attend group.   Adam [name] LRT/CTRS          Adam [name] 10/03/2022 10:33 AM 

## 2022-10-03 NOTE — Plan of Care (Signed)
D- Patient alert and oriented to person, place, and situation. Patient presented in a pleasant mood on assessment stating that he slept ok last night and had no complaints to voice to this Probation officer. Patient is childlike and due to a past stroke, he has expressive aphasia. Patient hasn't been as fixated on Nicole Kindred Iommi, but he did come up to the nurses station a few times with his picture, showing this Probation officer. Patient denied SI, HI, AVH, and pain at this time. Patient also denied any signs/symptoms of depression and anxiety.  A- Scheduled medications administered to patient, per MD orders. Support and encouragement provided.  Routine safety checks conducted every 15 minutes.  Patient informed to notify staff with problems or concerns.  R- No adverse drug reactions noted. Patient contracts for safety at this time. Patient compliant with medications. Patient receptive, calm, and cooperative. Patient remains safe at this time.  Problem: Education: Goal: Ability to state activities that reduce stress will improve Outcome: Progressing   Problem: Coping: Goal: Ability to identify and develop effective coping behavior will improve Outcome: Progressing   Problem: Self-Concept: Goal: Ability to identify factors that promote anxiety will improve Outcome: Progressing Goal: Level of anxiety will decrease Outcome: Progressing Goal: Ability to modify response to factors that promote anxiety will improve Outcome: Progressing   Problem: Education: Goal: Knowledge of General Education information will improve Description: Including pain rating scale, medication(s)/side effects and non-pharmacologic comfort measures Outcome: Progressing   Problem: Health Behavior/Discharge Planning: Goal: Ability to manage health-related needs will improve Outcome: Progressing   Problem: Clinical Measurements: Goal: Ability to maintain clinical measurements within normal limits will improve Outcome: Progressing Goal: Will  remain free from infection Outcome: Progressing Goal: Diagnostic test results will improve Outcome: Progressing Goal: Respiratory complications will improve Outcome: Progressing Goal: Cardiovascular complication will be avoided Outcome: Progressing   Problem: Activity: Goal: Risk for activity intolerance will decrease Outcome: Progressing   Problem: Nutrition: Goal: Adequate nutrition will be maintained Outcome: Progressing   Problem: Coping: Goal: Level of anxiety will decrease Outcome: Progressing   Problem: Elimination: Goal: Will not experience complications related to bowel motility Outcome: Progressing Goal: Will not experience complications related to urinary retention Outcome: Progressing   Problem: Pain Managment: Goal: General experience of comfort will improve Outcome: Progressing   Problem: Safety: Goal: Ability to remain free from injury will improve Outcome: Progressing   Problem: Skin Integrity: Goal: Risk for impaired skin integrity will decrease Outcome: Progressing   Problem: Activity: Goal: Interest or engagement in leisure activities will improve Outcome: Progressing   Problem: Coping: Goal: Coping ability will improve Outcome: Progressing   Problem: Education: Goal: Will be free of psychotic symptoms Outcome: Progressing

## 2022-10-03 NOTE — Progress Notes (Signed)
[facility] MD Progress Note  10/03/2022 10:50 AM Bryden Darden  MRN:  [id number]  Subjective: Patient seen on rounds today after notes were reviewed and discussed in the morning meeting. He continues to remain stable and is compliant with current treatment plan. Patient moves around unit without any issues. He is agreeable to the plan to transition to a group home on Friday.  A system wide meeting took place earlier today with his mother present.  She is agreeable to the discharge plan with a physical exam request, hospitalist consult placed for this.  Principal Problem: Schizophrenia, chronic condition with acute exacerbation (HCC) Diagnosis: Principal Problem:   Schizophrenia, chronic condition with acute exacerbation (HCC)  Total Time spent with patient: 30 minutes  Past Psychiatric History: Past history of schizophrenia which has been stabilized on current medication including clozapine.  History of major stroke causing dense aphasia  Past Medical History:  Past Medical History:  Diagnosis Date   Myocardial infarction (HCC)    Schizophrenia (HCC)    Stroke [facility])    History reviewed. No pertinent surgical history. Family History: History reviewed. No pertinent family history. Family Psychiatric  History: See previous Social History:  Social History   Substance and Sexual Activity  Alcohol Use Not Currently     Social History   Substance and Sexual Activity  Drug Use Not Currently    Social History   Socioeconomic History   Marital status: Single    Spouse name: Not on file   Number of children: Not on file   Years of education: Not on file   Highest education level: Not on file  Occupational History   Not on file  Tobacco Use   Smoking status: Never    Passive exposure: Never   Smokeless tobacco: Never  Vaping Use   Vaping Use: Unknown  Substance and Sexual Activity   Alcohol use: Not Currently   Drug use: Not Currently   Sexual activity: Not Currently  Other Topics  Concern   Not on file  Social History Narrative   Not on file   Social Determinants of Health   Financial Resource Strain: Not on file  Food Insecurity: Not on file  Transportation Needs: Not on file  Physical Activity: Not on file  Stress: Not on file  Social Connections: Not on file   Additional Social History:  Specify valuables returned:  (none)                      Sleep: Fair  Appetite:  Fair  Current Medications: Current Facility-Administered Medications  Medication Dose Route Frequency Provider Last Rate Last Admin   acetaminophen (TYLENOL) tablet 650 mg  650 mg Oral Q6H PRN [name], [name], MD   650 mg at 09/09/22 1813   alum & mag hydroxide-simeth (MAALOX/MYLANTA) 200-200-20 MG/5ML suspension 30 mL  30 mL Oral Q4H PRN [name], [name], MD   30 mL at 04/26/22 1950   aspirin EC tablet 81 mg  81 mg Oral Daily [name], [name], MD   81 mg at 10/03/22 9326   atorvastatin (LIPITOR) tablet 20 mg  20 mg Oral Daily [name], [name], MD   20 mg at 10/03/22 0927   atropine 1 % ophthalmic solution 1 drop  1 drop Sublingual QID [name], MD   1 drop at 10/03/22 0927   benztropine (COGENTIN) tablet 0.5 mg  0.5 mg Oral BID [name], [name], MD   0.5 mg at 10/03/22 [phone]  clonazePAM (KLONOPIN) tablet 1 mg  1 mg Oral TID PRN He, [name], MD   1 mg at 09/28/22 2130   cloZAPine (CLOZARIL) tablet 300 mg  300 mg Oral QHS [name], [name], MD   300 mg at 10/02/22 2122   diphenhydrAMINE (BENADRYL) capsule 50 mg  50 mg Oral Q6H PRN [name], [name], MD   50 mg at 09/30/22 2119   Or   diphenhydrAMINE (BENADRYL) injection 50 mg  50 mg Intramuscular Q6H PRN [name], [name], MD       feeding supplement (ENSURE ENLIVE / ENSURE PLUS) liquid 237 mL  237 mL Oral TID BM [name], [name], MD   237 mL at 10/03/22 0928   gabapentin (NEURONTIN) capsule 300 mg  300 mg Oral TID [name], [name], MD   300 mg at 10/03/22 8563   haloperidol (HALDOL) tablet 2 mg  2 mg Oral TID [name], [name], MD   2 mg at  10/03/22 1497   haloperidol (HALDOL) tablet 5 mg  5 mg Oral Q6H PRN [name], [name], MD   5 mg at 09/17/22 1532   Or   haloperidol lactate (HALDOL) injection 5 mg  5 mg Intramuscular Q6H PRN [name], [name], MD       lisinopril (ZESTRIL) tablet 5 mg  5 mg Oral Daily [name], [name], MD   5 mg at 10/03/22 0263   magnesium hydroxide (MILK OF MAGNESIA) suspension 30 mL  30 mL Oral Daily PRN [name], [name], MD   30 mL at 09/15/22 1047   metoprolol succinate (TOPROL-XL) 24 hr tablet 25 mg  25 mg Oral Daily [name], [name], MD   25 mg at 10/03/22 0928   ondansetron (ZOFRAN) tablet 4 mg  4 mg Oral Q8H PRN Lerry Liner M, NP   4 mg at 09/06/22 2320   paliperidone (INVEGA SUSTENNA) injection 156 mg  156 mg Intramuscular Q28 days [name], [name], MD   156 mg at 09/13/22 7858   senna-docusate (Senokot-S) tablet 2 tablet  2 tablet Oral QHS PRN Sarina Ill, DO   2 tablet at 09/15/22 2110   temazepam (RESTORIL) capsule 15 mg  15 mg Oral QHS [name], [name], MD   15 mg at 10/02/22 2122   ziprasidone (GEODON) injection 20 mg  20 mg Intramuscular Q12H PRN [name], [name], MD   20 mg at 07/08/22 1453    Lab Results: No results found for this or any previous visit (from the past 48 hour(s)).   Blood Alcohol level:  Lab Results  Component Value Date   ETH <10 07/20/2021    Metabolic Disorder Labs: Lab Results  Component Value Date   HGBA1C 5.5 04/10/2022   MPG 111.15 04/10/2022   No results found for: "PROLACTIN" Lab Results  Component Value Date   CHOL 167 11/20/2021   TRIG 107 11/20/2021   HDL 26 (L) 11/20/2021   CHOLHDL 6.4 11/20/2021   VLDL 21 11/20/2021   LDLCALC 120 (H) 11/20/2021    Physical Findings: AIMS: Facial and Oral Movements Muscles of Facial Expression: None, normal Lips and Perioral Area: None, normal Jaw: None, normal Tongue: None, normal,Extremity Movements Upper (arms, wrists, hands, fingers): None, normal Lower (legs, knees, ankles, toes): None, normal,  Trunk Movements Neck, shoulders, hips: None, normal, Overall Severity Severity of abnormal movements (highest score from questions above): None, normal Incapacitation due to abnormal movements: None, normal Patient's awareness of abnormal movements (rate only patient's report): No Awareness, Dental Status  Current problems with teeth and/or dentures?: No Does patient usually wear dentures?: No  CIWA:    COWS:     Musculoskeletal: Strength & Muscle Tone: within normal limits Gait & Station: normal Patient leans: N/A  Psychiatric Specialty Exam: Physical Exam Vitals and nursing note reviewed.  Constitutional:      Appearance: Normal appearance.  HENT:     Head: Normocephalic and atraumatic.     Nose: Nose normal.  Pulmonary:     Effort: Pulmonary effort is normal.  Musculoskeletal:        General: Normal range of motion.     Cervical back: Normal range of motion.  Neurological:     General: No focal deficit present.     Mental Status: He is alert and oriented to person, place, and time.  Psychiatric:        Attention and Perception: He is inattentive.        Mood and Affect: Affect is blunt.        Speech: Speech normal.        Behavior: Behavior normal. Behavior is cooperative.        Thought Content: Thought content normal.        Cognition and Memory: Cognition is impaired.        Judgment: Judgment is impulsive.     Review of Systems  Unable to perform ROS: Language  Psychiatric/Behavioral:  The patient is nervous/anxious.   All other systems reviewed and are negative.   Blood pressure 93/74, pulse 91, temperature 98.1 F (36.7 C), temperature source Oral, resp. rate 18, height 5\' 8"  (1.727 m), weight 85.8 kg, SpO2 97 %.Body mass index is 28.76 kg/m.  General Appearance: Casual  Eye Contact:  Good  Speech:  Garbled  Volume:  Normal  Mood:  Euthymic  Affect:  Congruent  Thought Process:  Coherent  Orientation:  Full (Time, Place, and Person)  Thought Content:   UTA, difficult to understand  Suicidal Thoughts:  No  Homicidal Thoughts:  No  Memory:  Immediate;   Fair Recent;   Fair Remote;   Fair  Judgement:  Fair  Insight:  UTA  Psychomotor Activity:  Normal  Concentration:  Concentration: Fair and Attention Span: Fair  Recall:  [place] of Knowledge:  UTA  Language:  Poor  Akathisia:  No  Handed:  Right  AIMS (if indicated):     Assets:  Leisure Time Physical Health Resilience Social Support  ADL's:  Intact  Cognition:  Impaired,  Mild  Sleep:  Number of Hours: 8.5      Physical Exam: Physical Exam Vitals and nursing note reviewed.  Constitutional:      Appearance: Normal appearance.  HENT:     Head: Normocephalic and atraumatic.     Nose: Nose normal.  Pulmonary:     Effort: Pulmonary effort is normal.  Musculoskeletal:        General: Normal range of motion.     Cervical back: Normal range of motion.  Neurological:     General: No focal deficit present.     Mental Status: He is alert and oriented to person, place, and time.  Psychiatric:        Attention and Perception: He is inattentive.        Mood and Affect: Affect is blunt.        Speech: Speech normal.        Behavior: Behavior normal. Behavior is cooperative.        Thought  Content: Thought content normal.        Cognition and Memory: Cognition is impaired.        Judgment: Judgment is impulsive.    Review of Systems  Unable to perform ROS: Language  Psychiatric/Behavioral:  The patient is nervous/anxious.   All other systems reviewed and are negative.  Blood pressure 93/74, pulse 91, temperature 98.1 F (36.7 C), temperature source Oral, resp. rate 18, height 5\' 8"  (1.727 m), weight 85.8 kg, SpO2 97 %. Body mass index is 28.76 kg/m.   Treatment Plan Summary: Medication management and Plan no change to medication.  Offered support for the patient and encouraged him to let know if there is something specific we can do to help.  Potential placement  found for patient - performed TB skin test waiting for results and COVID testing was negative. Schizophrenia: Haldol 2 mg TID Clozaril 300 mg at bedtime Invega 156 mg monthly  Anxiety Gabapentin 300 mg BID Klonopin 1 mg TID  Insomnia Restoril 15 mg daily at bedtime  EPS: Cogentin 0.5 mg BID  [place], NP 10/03/2022, 10:50 AM

## 2022-10-04 NOTE — Plan of Care (Signed)
  Problem: Education: Goal: Ability to state activities that reduce stress will improve Outcome: Progressing   Problem: Coping: Goal: Ability to identify and develop effective coping behavior will improve Outcome: Progressing   Problem: Self-Concept: Goal: Ability to identify factors that promote anxiety will improve Outcome: Progressing Goal: Level of anxiety will decrease Outcome: Progressing Goal: Ability to modify response to factors that promote anxiety will improve Outcome: Progressing   Problem: Education: Goal: Knowledge of General Education information will improve Description: Including pain rating scale, medication(s)/side effects and non-pharmacologic comfort measures Outcome: Progressing   Problem: Education: Goal: Will be free of psychotic symptoms Outcome: Progressing   Problem: Coping: Goal: Coping ability will improve Outcome: Progressing   Problem: Activity: Goal: Interest or engagement in leisure activities will improve Outcome: Progressing   Problem: Skin Integrity: Goal: Risk for impaired skin integrity will decrease Outcome: Progressing

## 2022-10-04 NOTE — Progress Notes (Signed)
Recreation Therapy Notes  Date: 10/04/2022   Time: 10:15 am   Location: Craft room    Behavioral response: N/A   Intervention Topic: Time Management    Discussion/Intervention: Patient refused to attend group.   Clinical Observations/Feedback:  Patient refused to attend group.   Matteus Mcnelly LRT/CTRS        Jamirra Curnow 10/04/2022 11:04 AM

## 2022-10-04 NOTE — Progress Notes (Signed)
In a pleasant and upbeat mood this evening.  Active on the unit hanging in the dayroom with his peers. He is med compliant and received meds without issue. Denies having any psychotic symptoms.  Safety checks in place q15 minutes.     C Butler-Nicholson, LPN

## 2022-10-04 NOTE — Progress Notes (Signed)
Recreation Therapy Notes  Date: 10/04/2022   Time: 2:15pm   Location: Courtyard    Behavioral response: Appropriate   Group Type: Recreation and Leisure   Participation level: Active   Communication: Patient was social with peers and staff.   Comments: N/A   Jenne Sellinger LRT/CTRS            Sheria Rosello 10/04/2022 3:04 PM

## 2022-10-04 NOTE — Progress Notes (Signed)
[facility] MD Progress Note  10/04/2022 11:17 AM Teyo Longton  MRN:  [id number]  Subjective: Patient seen on rounds today after notes were reviewed and discussed in the morning meeting. He continues to remain stable and is compliant with current treatment plan. Patient moves around unit without any issues. He appears excited to be transferring to a group home tomorrow, awaiting physical work-up from a hospitalist, consult placed.  Principal Problem: Schizophrenia, chronic condition with acute exacerbation ([place]) Diagnosis: Principal Problem:   Schizophrenia, chronic condition with acute exacerbation ([place])  Total Time spent with patient: 30 minutes  Past Psychiatric History: Past history of schizophrenia which has been stabilized on current medication including clozapine.  History of major stroke causing dense aphasia  Past Medical History:  Past Medical History:  Diagnosis Date   Myocardial infarction ([place])    Schizophrenia ([place])    Stroke [facility])    History reviewed. No pertinent surgical history. Family History: History reviewed. No pertinent family history. Family Psychiatric  History: See previous Social History:  Social History   Substance and Sexual Activity  Alcohol Use Not Currently     Social History   Substance and Sexual Activity  Drug Use Not Currently    Social History   Socioeconomic History   Marital status: Single    Spouse name: Not on file   Number of children: Not on file   Years of education: Not on file   Highest education level: Not on file  Occupational History   Not on file  Tobacco Use   Smoking status: Never    Passive exposure: Never   Smokeless tobacco: Never  Vaping Use   Vaping Use: Unknown  Substance and Sexual Activity   Alcohol use: Not Currently   Drug use: Not Currently   Sexual activity: Not Currently  Other Topics Concern   Not on file  Social History Narrative   Not on file   Social Determinants of Health   Financial Resource  Strain: Not on file  Food Insecurity: Not on file  Transportation Needs: Not on file  Physical Activity: Not on file  Stress: Not on file  Social Connections: Not on file   Additional Social History:  Specify valuables returned:  (none)                      Sleep: Fair  Appetite:  Fair  Current Medications: Current Facility-Administered Medications  Medication Dose Route Frequency Provider Last Rate Last Admin   acetaminophen (TYLENOL) tablet 650 mg  650 mg Oral Q6H PRN [name], [name], MD   650 mg at 09/09/22 1813   alum & mag hydroxide-simeth (MAALOX/MYLANTA) 200-200-20 MG/5ML suspension 30 mL  30 mL Oral Q4H PRN [name], [name], MD   30 mL at 04/26/22 [id number]   aspirin EC tablet 81 mg  81 mg Oral Daily [name], [name], MD   81 mg at 10/04/22 [id number]   atorvastatin (LIPITOR) tablet 20 mg  20 mg Oral Daily [name], [name], MD   20 mg at 10/04/22 0823   atropine 1 % ophthalmic solution 1 drop  1 drop Sublingual QID [name], MD   1 drop at 10/04/22 0823   benztropine (COGENTIN) tablet 0.5 mg  0.5 mg Oral BID [name], [name], MD   0.5 mg at 10/04/22 0823   clonazePAM (KLONOPIN) tablet 1 mg  1 mg Oral TID PRN He, [name], MD   1 mg at 09/28/22 2130   cloZAPine (CLOZARIL) tablet  300 mg  300 mg Oral QHS [name], [name], MD   300 mg at 10/03/22 2106   diphenhydrAMINE (BENADRYL) capsule 50 mg  50 mg Oral Q6H PRN [name], [name], MD   50 mg at 09/30/22 2119   Or   diphenhydrAMINE (BENADRYL) injection 50 mg  50 mg Intramuscular Q6H PRN [name], [name], MD       feeding supplement (ENSURE ENLIVE / ENSURE PLUS) liquid 237 mL  237 mL Oral TID BM [name], [name], MD   237 mL at 10/04/22 1008   gabapentin (NEURONTIN) capsule 300 mg  300 mg Oral TID [name], [name], MD   300 mg at 10/04/22 1007   haloperidol (HALDOL) tablet 2 mg  2 mg Oral TID [name], [name], MD   2 mg at 10/04/22 1007   haloperidol (HALDOL) tablet 5 mg  5 mg Oral Q6H PRN [name], [name], MD   5 mg at 09/17/22 1532   Or    haloperidol lactate (HALDOL) injection 5 mg  5 mg Intramuscular Q6H PRN [name], [name], MD       lisinopril (ZESTRIL) tablet 5 mg  5 mg Oral Daily [name], [name], MD   5 mg at 10/04/22 [id number]   magnesium hydroxide (MILK OF MAGNESIA) suspension 30 mL  30 mL Oral Daily PRN [name], [name], MD   30 mL at 09/15/22 1047   metoprolol succinate (TOPROL-XL) 24 hr tablet 25 mg  25 mg Oral Daily [name], [name], MD   25 mg at 10/04/22 0823   ondansetron (ZOFRAN) tablet 4 mg  4 mg Oral Q8H PRN Deloria Lair, NP   4 mg at 09/06/22 2320   paliperidone (INVEGA SUSTENNA) injection 156 mg  156 mg Intramuscular Q28 days [name], [name], MD   156 mg at 09/13/22 [id number]   senna-docusate (Senokot-S) tablet 2 tablet  2 tablet Oral QHS PRN [name], DO   2 tablet at 09/15/22 2110   temazepam (RESTORIL) capsule 15 mg  15 mg Oral QHS [name], [name], MD   15 mg at 10/03/22 2106   ziprasidone (GEODON) injection 20 mg  20 mg Intramuscular Q12H PRN [name], [name], MD   20 mg at 07/08/22 1453    Lab Results:  Results for orders placed or performed during the hospital encounter of 03/19/22 (from the past 48 hour(s))  CBC with Differential/Platelet     Status: Abnormal   Collection Time: 10/03/22  1:12 PM  Result Value Ref Range   WBC 4.7 4.0 - 10.5 K/uL   RBC 4.19 (L) 4.22 - 5.81 MIL/uL   Hemoglobin 13.3 13.0 - 17.0 g/dL   HCT 37.9 (L) 39.0 - 52.0 %   MCV 90.5 80.0 - 100.0 fL   MCH 31.7 26.0 - 34.0 pg   MCHC 35.1 30.0 - 36.0 g/dL   RDW 13.3 11.5 - 15.5 %   Platelets 154 150 - 400 K/uL   nRBC 0.0 0.0 - 0.2 %   Neutrophils Relative % 70 %   Neutro Abs 3.3 1.7 - 7.7 K/uL   Lymphocytes Relative 19 %   Lymphs Abs 0.9 0.7 - 4.0 K/uL   Monocytes Relative 9 %   Monocytes Absolute 0.4 0.1 - 1.0 K/uL   Eosinophils Relative 0 %   Eosinophils Absolute 0.0 0.0 - 0.5 K/uL   Basophils Relative 2 %   Basophils Absolute 0.1 0.0 - 0.1 K/uL   Immature Granulocytes 0 %   Abs  Immature Granulocytes 0.02 0.00 -  0.07 K/uL    Comment: Performed at [facility], [address] [place], [place] [address]     Blood Alcohol level:  Lab Results  Component Value Date   [facility] <10 [id number]    Metabolic Disorder Labs: Lab Results  Component Value Date   HGBA1C 5.5 04/10/2022   MPG 111.15 04/10/2022   No results found for: "PROLACTIN" Lab Results  Component Value Date   CHOL 167 11/20/2021   TRIG 107 11/20/2021   HDL 26 (L) 11/20/2021   CHOLHDL 6.4 11/20/2021   VLDL 21 11/20/2021   LDLCALC 120 (H) 11/20/2021    Physical Findings: AIMS: Facial and Oral Movements Muscles of Facial Expression: None, normal Lips and Perioral Area: None, normal Jaw: None, normal Tongue: None, normal,Extremity Movements Upper (arms, wrists, hands, fingers): None, normal Lower (legs, knees, ankles, toes): None, normal, Trunk Movements Neck, shoulders, hips: None, normal, Overall Severity Severity of abnormal movements (highest score from questions above): None, normal Incapacitation due to abnormal movements: None, normal Patient's awareness of abnormal movements (rate only patient's report): No Awareness, Dental Status Current problems with teeth and/or dentures?: No Does patient usually wear dentures?: No  CIWA:    COWS:     Musculoskeletal: Strength & Muscle Tone: within normal limits Gait & Station: normal Patient leans: N/A  Psychiatric Specialty Exam: Physical Exam Vitals and nursing note reviewed.  Constitutional:      Appearance: Normal appearance.  HENT:     Head: Normocephalic and atraumatic.     Nose: Nose normal.  Pulmonary:     Effort: Pulmonary effort is normal.  Musculoskeletal:        General: Normal range of motion.     Cervical back: Normal range of motion.  Neurological:     General: No focal deficit present.     Mental Status: He is alert and oriented to person, place, and time.  Psychiatric:        Attention and Perception: He is inattentive.        Mood and  Affect: Affect is blunt.        Speech: Speech normal.        Behavior: Behavior normal. Behavior is cooperative.        Thought Content: Thought content normal.        Cognition and Memory: Cognition is impaired.        Judgment: Judgment is impulsive.     Review of Systems  Unable to perform ROS: Language  Psychiatric/Behavioral:  The patient is nervous/anxious.   All other systems reviewed and are negative.   Blood pressure 115/85, pulse 88, temperature 98 F (36.7 C), temperature source Oral, resp. rate 18, height 5\' 8"  (1.727 m), weight 85.8 kg, SpO2 99 %.Body mass index is 28.76 kg/m.  General Appearance: Casual  Eye Contact:  Good  Speech:  Garbled  Volume:  Normal  Mood:  Euthymic  Affect:  Congruent  Thought Process:  Coherent  Orientation:  Full (Time, Place, and Person)  Thought Content:  UTA, difficult to understand  Suicidal Thoughts:  No  Homicidal Thoughts:  No  Memory:  Immediate;   Fair Recent;   Fair Remote;   Fair  Judgement:  Fair  Insight:  UTA  Psychomotor Activity:  Normal  Concentration:  Concentration: Fair and Attention Span: Fair  Recall:  [place] of Knowledge:  UTA  Language:  Poor  Akathisia:  No  Handed:  Right  AIMS (if  indicated):     Assets:  Leisure Time Physical Health Resilience Social Support  ADL's:  Intact  Cognition:  Impaired,  Mild  Sleep:  Number of Hours: 8.5      Physical Exam: Physical Exam Vitals and nursing note reviewed.  Constitutional:      Appearance: Normal appearance.  HENT:     Head: Normocephalic and atraumatic.     Nose: Nose normal.  Pulmonary:     Effort: Pulmonary effort is normal.  Musculoskeletal:        General: Normal range of motion.     Cervical back: Normal range of motion.  Neurological:     General: No focal deficit present.     Mental Status: He is alert and oriented to person, place, and time.  Psychiatric:        Attention and Perception: He is inattentive.        Mood and  Affect: Affect is blunt.        Speech: Speech normal.        Behavior: Behavior normal. Behavior is cooperative.        Thought Content: Thought content normal.        Cognition and Memory: Cognition is impaired.        Judgment: Judgment is impulsive.    Review of Systems  Unable to perform ROS: Language  Psychiatric/Behavioral:  The patient is nervous/anxious.   All other systems reviewed and are negative.  Blood pressure 115/85, pulse 88, temperature 98 F (36.7 C), temperature source Oral, resp. rate 18, height 5\' 8"  (1.727 m), weight 85.8 kg, SpO2 99 %. Body mass index is 28.76 kg/m.   Treatment Plan Summary: Medication management and Plan no change to medication.  Offered support for the patient and encouraged him to let us know if there is something specific we can do to help.  Potential placement found for patient - performed TB skin test waiting for results and COVID testing was negative. Schizophrenia: Haldol 2 mg TID Clozaril 300 mg at bedtime Invega 156 mg monthly  Anxiety Gabapentin 300 mg BID Klonopin 1 mg TID  Insomnia Restoril 15 mg daily at bedtime  EPS: Cogentin 0.5 mg BID  Waylan Boga, NP 10/04/2022, 11:17 AM

## 2022-10-04 NOTE — Plan of Care (Signed)
D: Patient alert and oriented. Patient denies pain. Patient denies anxiety and depression. Patient denies SI/HI/AVH. Patient began yelling around the hallways after dinner. Patient has calmed down through verbal de-escalation.  Patient frequently observed walking around the hallway and interacting with staff and peers.  A: Scheduled medications administered to patient, per MD orders.  Support and encouragement provided to patient.  Q15 minute safety checks maintained.   R: Patient compliant with medication administration and treatment plan. No adverse drug reactions noted. Patient remains safe on the unit at this time. Problem: Safety: Goal: Ability to remain free from injury will improve Outcome: Progressing   Problem: Clinical Measurements: Goal: Ability to maintain clinical measurements within normal limits will improve Outcome: Progressing Goal: Will remain free from infection Outcome: Progressing   Problem: Education: Goal: Knowledge of General Education information will improve Description: Including pain rating scale, medication(s)/side effects and non-pharmacologic comfort measures Outcome: Progressing

## 2022-10-04 NOTE — Plan of Care (Signed)
Pt denies anxiety/depression at this time. Pt denies SI/HI/AVH or pain at this time. Pt is cooperative. Pt is medication compliant. Pt provided with support and encouragement. Pt monitored q15 minutes for safety per unit policy. Plan of care ongoing.   Problem: Education: Goal: Ability to state activities that reduce stress will improve Outcome: Not Progressing   Problem: Coping: Goal: Ability to identify and develop effective coping behavior will improve Outcome: Not Progressing

## 2022-10-05 ENCOUNTER — Encounter: Payer: Self-pay | Admitting: Psychiatry

## 2022-10-05 DIAGNOSIS — I5022 Chronic systolic (congestive) heart failure: Secondary | ICD-10-CM | POA: Diagnosis present

## 2022-10-05 DIAGNOSIS — E785 Hyperlipidemia, unspecified: Secondary | ICD-10-CM | POA: Diagnosis present

## 2022-10-05 DIAGNOSIS — F419 Anxiety disorder, unspecified: Secondary | ICD-10-CM | POA: Diagnosis present

## 2022-10-05 DIAGNOSIS — I69319 Unspecified symptoms and signs involving cognitive functions following cerebral infarction: Secondary | ICD-10-CM

## 2022-10-05 DIAGNOSIS — F209 Schizophrenia, unspecified: Secondary | ICD-10-CM | POA: Diagnosis not present

## 2022-10-05 DIAGNOSIS — I10 Essential (primary) hypertension: Secondary | ICD-10-CM | POA: Diagnosis present

## 2022-10-05 DIAGNOSIS — I2510 Atherosclerotic heart disease of native coronary artery without angina pectoris: Secondary | ICD-10-CM | POA: Diagnosis present

## 2022-10-05 NOTE — Group Note (Signed)
[facility] LCSW Group Therapy Note   Group Date: 10/05/2022 Start Time: 1315 End Time: 1415  Type of Therapy and Topic:  Group Therapy:  Feelings around Relapse and Recovery  Participation Level:  Did Not Attend    Description of Group:    Patients in this group will discuss emotions they experience before and after a relapse. They will process how experiencing these feelings, or avoidance of experiencing them, relates to having a relapse. Facilitator will guide patients to explore emotions they have related to recovery. Patients will be encouraged to process which emotions are more powerful. They will be guided to discuss the emotional reaction significant others in their lives may have to patients' relapse or recovery. Patients will be assisted in exploring ways to respond to the emotions of others without this contributing to a relapse.  Therapeutic Goals: Patient will identify two or more emotions that lead to relapse for them:  Patient will identify two emotions that result when they relapse:  Patient will identify two emotions related to recovery:  Patient will demonstrate ability to communicate their needs through discussion and/or role plays.   Summary of Patient Progress: X   Therapeutic Modalities:   Cognitive Behavioral Therapy Solution-Focused Therapy Assertiveness Training Relapse Prevention Therapy   Aribella Vavra R Omarr Hann, LCSW 

## 2022-10-05 NOTE — [facility] Counselor (Signed)
CSW contacted the patient's mother to update on the findings of the medical workup completed on pt at mother's request.  Assunta Curtis, MSW, LCSW 10/05/2022 4:14 PM

## 2022-10-05 NOTE — [facility] Group Notes (Signed)
[facility] Group Notes:  (Nursing/MHT/Case Management/Adjunct)  Date:  10/05/2022  Time:  10:26 AM  Type of Therapy:   Community Meeting  Participation Level:  Did Not Attend  Adela Lank [facility] 10/05/2022, 10:26 AM

## 2022-10-05 NOTE — Plan of Care (Signed)
D- Patient alert and oriented to person, place, and situation. Patient presents in a pleasant mood on assessment stating that he slept good last night and had no complaints to voice to this Probation officer. Patient is childlike and due to a past stroke, he has expressive aphasia.  Patient has been focused on the word "guitars", bringing a printout on [employer] up to the nurses station and pointing to the word. After this Probation officer says what patient is pointing to, he repeats it, and walks off smiling. Patient denies SI, HI, AVH, and pain at this time. Patient also denies any signs/symptoms of depression and anxiety by shaking his head and saying "no". Patient had no stated goals for today.  A- Scheduled medications administered to patient, per MD orders. Support and encouragement provided.  Routine safety checks conducted every 15 minutes.  Patient informed to notify staff with problems or concerns.  R- No adverse drug reactions noted. Patient contracts for safety at this time. Patient compliant with medications. Patient receptive, calm, and cooperative. Patient remains safe at this time.  Problem: Education: Goal: Ability to state activities that reduce stress will improve Outcome: Progressing   Problem: Coping: Goal: Ability to identify and develop effective coping behavior will improve Outcome: Progressing   Problem: Self-Concept: Goal: Ability to identify factors that promote anxiety will improve Outcome: Progressing Goal: Level of anxiety will decrease Outcome: Progressing Goal: Ability to modify response to factors that promote anxiety will improve Outcome: Progressing   Problem: Education: Goal: Knowledge of General Education information will improve Description: Including pain rating scale, medication(s)/side effects and non-pharmacologic comfort measures Outcome: Progressing   Problem: Health Behavior/Discharge Planning: Goal: Ability to manage health-related needs will improve Outcome:  Progressing   Problem: Clinical Measurements: Goal: Ability to maintain clinical measurements within normal limits will improve Outcome: Progressing Goal: Will remain free from infection Outcome: Progressing Goal: Diagnostic test results will improve Outcome: Progressing Goal: Respiratory complications will improve Outcome: Progressing Goal: Cardiovascular complication will be avoided Outcome: Progressing   Problem: Activity: Goal: Risk for activity intolerance will decrease Outcome: Progressing   Problem: Nutrition: Goal: Adequate nutrition will be maintained Outcome: Progressing   Problem: Coping: Goal: Level of anxiety will decrease Outcome: Progressing   Problem: Elimination: Goal: Will not experience complications related to bowel motility Outcome: Progressing Goal: Will not experience complications related to urinary retention Outcome: Progressing   Problem: Pain Managment: Goal: General experience of comfort will improve Outcome: Progressing   Problem: Safety: Goal: Ability to remain free from injury will improve Outcome: Progressing   Problem: Skin Integrity: Goal: Risk for impaired skin integrity will decrease Outcome: Progressing   Problem: Activity: Goal: Interest or engagement in leisure activities will improve Outcome: Progressing   Problem: Coping: Goal: Coping ability will improve Outcome: Progressing   Problem: Education: Goal: Will be free of psychotic symptoms Outcome: Progressing

## 2022-10-05 NOTE — Progress Notes (Signed)
[facility] MD Progress Note  10/05/2022 4:18 PM Adam [name]  MRN:  [id number] Subjective: Patient seen for follow-up.  61 year old with schizophrenia.  No change to behavior symptoms.  Continues to be mostly calm easily redirectable and no behavior problems.  I am aware that plans are underway for hopeful discharge this upcoming week. 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 No pertinent surgical history. Family History: History reviewed. No pertinent family history. Family Psychiatric  History: See previous Social History:  Social History   Substance and Sexual Activity  Alcohol Use Not Currently     Social History   Substance and Sexual Activity  Drug Use Not Currently    Social History   Socioeconomic History   Marital status: Single    Spouse name: Not on file   Number of children: Not on file   Years of education: Not on file   Highest education level: Not on file  Occupational History   Not on file  Tobacco Use   Smoking status: Never    Passive exposure: Never   Smokeless tobacco: Never  Vaping Use   Vaping Use: Unknown  Substance and Sexual Activity   Alcohol use: Not Currently   Drug use: Not Currently   Sexual activity: Not Currently  Other Topics Concern   Not on file  Social History Narrative   Not on file   Social  Determinants of Health   Financial Resource Strain: Not on file  Food Insecurity: Not on file  Transportation Needs: Not on file  Physical Activity: Not on file  Stress: Not on file  Social Connections: Not on file   Additional Social History:  Specify valuables returned:  (none)                      Sleep: Fair  Appetite:  Fair  Current Medications: Current Facility-Administered Medications  Medication Dose Route Frequency Provider Last Rate Last Admin   acetaminophen (TYLENOL) tablet 650 mg  650 mg Oral Q6H PRN Roneisha Stern T, MD   650 mg at 09/09/22 1813   alum & mag hydroxide-simeth (MAALOX/MYLANTA) 200-200-20 MG/5ML suspension 30 mL  30 mL Oral Q4H PRN Tocara Mennen T, MD   30 mL at 04/26/22 1914   aspirin EC tablet 81 mg  81 mg Oral Daily Tinnie Kunin T, MD   81 mg at 10/05/22 1058   atorvastatin (LIPITOR) tablet 20 mg  20 mg Oral Daily Candas Deemer T, MD   20 mg at 10/05/22 1058   atropine 1 % ophthalmic solution 1 drop  1 drop Sublingual QID [name], MD   1 drop at 10/05/22 1617   benztropine (COGENTIN) tablet 0.5 mg  0.5 mg Oral BID Chief Walkup T, MD   0.5 mg at 10/05/22 1617   clonazePAM (KLONOPIN) tablet 1 mg  1 mg Oral TID PRN He, [name], MD  1 mg at 09/28/22 2130   cloZAPine (CLOZARIL) tablet 300 mg  300 mg Oral QHS Heylee Tant T, MD   300 mg at 10/04/22 2109   diphenhydrAMINE (BENADRYL) capsule 50 mg  50 mg Oral Q6H PRN Kensleigh Gates T, MD   50 mg at 09/30/22 2119   Or   diphenhydrAMINE (BENADRYL) injection 50 mg  50 mg Intramuscular Q6H PRN Ameliana Brashear T, MD       feeding supplement (ENSURE ENLIVE / ENSURE PLUS) liquid 237 mL  237 mL Oral TID BM Myriam Brandhorst T, MD   237 mL at 10/04/22 1008   gabapentin (NEURONTIN) capsule 300 mg  300 mg Oral TID Conlee Sliter T, MD   300 mg at 10/05/22 1545   haloperidol (HALDOL) tablet 2 mg  2 mg Oral TID Sherel Fennell T, MD   2 mg at 10/05/22 1545   haloperidol (HALDOL) tablet 5 mg  5 mg Oral Q6H PRN Carson Meche,  Kasey Ewings T, MD   5 mg at 09/17/22 1532   Or   haloperidol lactate (HALDOL) injection 5 mg  5 mg Intramuscular Q6H PRN Tylyn Stankovich T, MD       lisinopril (ZESTRIL) tablet 5 mg  5 mg Oral Daily Telsa Dillavou T, MD   5 mg at 10/05/22 1058   magnesium hydroxide (MILK OF MAGNESIA) suspension 30 mL  30 mL Oral Daily PRN Ashe Graybeal T, MD   30 mL at 09/15/22 1047   metoprolol succinate (TOPROL-XL) 24 hr tablet 25 mg  25 mg Oral Daily Elyna Pangilinan T, MD   25 mg at 10/05/22 1058   ondansetron (ZOFRAN) tablet 4 mg  4 mg Oral Q8H PRN Lerry Liner M, NP   4 mg at 09/06/22 2320   paliperidone (INVEGA SUSTENNA) injection 156 mg  156 mg Intramuscular Q28 days Graylen Noboa, [name], MD   156 mg at 09/13/22 7867   senna-docusate (Senokot-S) tablet 2 tablet  2 tablet Oral QHS PRN Sarina Ill, DO   2 tablet at 09/15/22 2110   temazepam (RESTORIL) capsule 15 mg  15 mg Oral QHS Pocahontas Cohenour T, MD   15 mg at 10/04/22 2109   ziprasidone (GEODON) injection 20 mg  20 mg Intramuscular Q12H PRN Tais Koestner, [name], MD   20 mg at 07/08/22 1453    Lab Results: No results found for this or any previous visit (from the past 48 hour(s)).  Blood Alcohol level:  Lab Results  Component Value Date   ETH <10 07/20/2021    Metabolic Disorder Labs: Lab Results  Component Value Date   HGBA1C 5.5 04/10/2022   MPG 111.15 04/10/2022   No results found for: "PROLACTIN" Lab Results  Component Value Date   CHOL 167 11/20/2021   TRIG 107 11/20/2021   HDL 26 (L) 11/20/2021   CHOLHDL 6.4 11/20/2021   VLDL 21 11/20/2021   LDLCALC 120 (H) 11/20/2021    Physical Findings: AIMS: Facial and Oral Movements Muscles of Facial Expression: None, normal Lips and Perioral Area: None, normal Jaw: None, normal Tongue: None, normal,Extremity Movements Upper (arms, wrists, hands, fingers): None, normal Lower (legs, knees, ankles, toes): None, normal, Trunk Movements Neck, shoulders, hips: None, normal, Overall Severity Severity  of abnormal movements (highest score from questions above): None, normal Incapacitation due to abnormal movements: None, normal Patient's awareness of abnormal movements (rate only patient's report): No Awareness, Dental Status Current problems with teeth and/or dentures?: No Does patient usually wear dentures?: No  CIWA:  COWS:     Musculoskeletal: Strength & Muscle Tone: within normal limits Gait & Station: normal Patient leans: N/A  Psychiatric Specialty Exam:  Presentation  General Appearance:  Appropriate for Environment; Casual; Neat; Well Groomed  Eye Contact: Fair  Speech: Slow  Speech Volume: Decreased  Handedness: Left   Mood and Affect  Mood: Anxious  Affect: Congruent   Thought Process  Thought Processes: Goal Directed  Descriptions of Associations:Circumstantial  Orientation:Partial  Thought Content:Scattered  History of Schizophrenia/Schizoaffective disorder:Yes  Duration of Psychotic Symptoms:Greater than six months  Hallucinations:No data recorded Ideas of Reference:None  Suicidal Thoughts:No data recorded Homicidal Thoughts:No data recorded  Sensorium  Memory: Remote Good  Judgment: Fair  Insight: Fair   Community education officer  Concentration: Fair  Attention Span: Fair  Recall: [employer] of Knowledge: Fair  Language: Fair   Psychomotor Activity  Psychomotor Activity:No data recorded  Assets  Assets:No data recorded  Sleep  Sleep:No data recorded   Physical Exam: Physical Exam Vitals and nursing note reviewed.  Constitutional:      Appearance: Normal appearance.  HENT:     Head: Normocephalic and atraumatic.     Mouth/Throat:     Pharynx: Oropharynx is clear.  Eyes:     Pupils: Pupils are equal, round, and reactive to light.  Cardiovascular:     Rate and Rhythm: Normal rate and regular rhythm.  Pulmonary:     Effort: Pulmonary effort is normal.     Breath sounds: Normal breath sounds.   Abdominal:     General: Abdomen is flat.     Palpations: Abdomen is soft.  Musculoskeletal:        General: Normal range of motion.  Skin:    General: Skin is warm and dry.  Neurological:     General: No focal deficit present.     Mental Status: He is alert. Mental status is at baseline.  Psychiatric:        Attention and Perception: He is inattentive.        Speech: He is noncommunicative.    Review of Systems  Unable to perform ROS: Language  Cardiovascular:  Negative for chest pain.   Blood pressure 120/86, pulse 90, temperature (!) 97.5 F (36.4 C), temperature source Oral, resp. rate 20, height 5\' 8"  (1.727 m), weight 85.8 kg, SpO2 100 %. Body mass index is 28.76 kg/m.   Treatment Plan Summary: Plan patient seems to understand that there may be a placement for him coming up and is pleased.  Mother had requested a medicine consult prior to discharge.  Hospitalist did a consult today which I very much appreciate and found the patient to be medically cleared and in need of no further workup medically.  We are hoping that things may fall into place by Monday  Alethia Berthold, MD 10/05/2022, 4:18 PM

## 2022-10-05 NOTE — [facility] Group Notes (Signed)
[facility] Group Notes:  (Nursing/MHT/Case Management/Adjunct)  Date:  10/05/2022  Time:  8:52 PM  Type of Therapy:   Wrap up  Participation Level:  Active  Participation Quality:  Appropriate  Affect:  Appropriate  Cognitive:  Alert  Insight:  Good  Engagement in Group:  Engaged and he said his name  Modes of Intervention:  Support  Summary of Progress/Problems:  Adam [name] 10/05/2022, 8:52 PM

## 2022-10-05 NOTE — Plan of Care (Signed)
Pt denies anxiety/depression at this time. Pt denies SI/HI/AVH or pain at this time. Pt is calm and cooperative. Pt is medication compliant. Pt provided with support and encouragement. Pt monitored q15 minutes for safety per unit policy. Plan of care ongoing.    Problem: Self-Concept: Goal: Level of anxiety will decrease Outcome: Progressing   Problem: Activity: Goal: Risk for activity intolerance will decrease Outcome: Progressing   

## 2022-10-05 NOTE — Progress Notes (Signed)
Re[facility] Therapy Notes    Date: 10/05/2022  Time: 10:35 am     Location: Craft room   Behavioral response: N/A   Intervention Topic: Self-care   Discussion/Intervention: Patient refused to attend group.   Clinical Observations/Feedback:  Patient refused to attend group.    Zhane Donlan LRT/CTRS        Makenzie Weisner 10/05/2022 12:36 PM

## 2022-10-05 NOTE — Consult Note (Signed)
Medical Consultation   Adam [name]  ZJI:[id number]  DOB: [birth year]  DOA: 03/19/2022  PCP: Pcp, No   Outpatient Specialists:    Requesting physician: -Dr. Toni Amend of psychiatry  Reason for consultation: -Medical clearance for discharge   History of Present Illness: Adam [name] is an 61 y.o. 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 Pt is ready for discharge, we are asked to evaluate patient for medical clearance.   When I saw in psych unit.  He does not have any complaint. Denies hallucination, no suicidal or homicidal ideations.  Patient has chronic difficulty speaking, which has not changed.  He moves all extremities normally.  Patient denies chest pain, cough, shortness breath.  No nausea, vomiting, diarrhea or abdominal pain.  No symptoms of UTI.  No leg edema. He is alert, orientated to place and knows his own name.  Patient is not orientated to the time.  Per nurse at the bedside, his mental status is at normal baseline.  Date reviewed, lab, image and vitals: WBC 4.7, temperature normal, blood pressure 120/86, heart rate 92, 90, RR 20, oxygen saturation 99% on room air.  Review of Systems:   General: no fevers, chills, no changes in body weight, no changes in appetite Skin: no rash HEENT: no blurry vision, hearing changes or sore throat Pulm: no dyspnea, coughing, wheezing CV: no chest pain, palpitations, shortness of breath Abd: no nausea/vomiting, abdominal pain, diarrhea/constipation GU: no dysuria, hematuria, polyuria Ext: no arthralgias, myalgias Neuro: has chronic difficulty speaking    Past Medical History: Past Medical History:  Diagnosis Date   Myocardial infarction (HCC)    Schizophrenia (HCC)    Stroke [facility])     Past  Surgical History: History reviewed. No pertinent surgical history.   Allergies:   Allergies  Allergen Reactions   Morphine And Related    Nsaids      Social History:  reports that he has never smoked. He has never been exposed to tobacco smoke. He has never used smokeless tobacco. He reports that he does not currently use alcohol. He reports that he does not currently use drugs.  Family History: History reviewed. No pertinent family history.  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       ENT: No discharge from the ears and nose, no pharynx injection, no tonsillar enlargement.        Neck: No JVD, no bruit, no mass felt. Heme: No neck lymph node enlargement. Cardiac: S1/S2, RRR, No murmurs, No gallops or rubs. Respiratory: No rales, wheezing, rhonchi or rubs. GI: Soft, nondistended, nontender, no rebound pain, no organomegaly, BS present. GU: No hematuria Ext: No  pitting leg edema bilaterally. 1+DP/PT pulse bilaterally. Musculoskeletal: No joint deformities, No joint redness or warmth, no limitation of ROM in spin. Skin: No rashes.  Neuro: Alert, oriented to place and knows his own name, not oriented to time.  Has mild difficult speaking, cranial nerves II-XII grossly intact, moves all extremities normally Psych: Patient is not psychotic, no suicidal or hemocidal ideation.    Data reviewed:  I have personally reviewed following labs and imaging studies Labs:  CBC: Recent Labs  Lab 10/03/22 1312  WBC 4.7  NEUTROABS  3.3  HGB 13.3  HCT 37.9*  MCV 90.5  PLT 245    Basic Metabolic Panel: No results for input(s): "NA", "K", "CL", "CO2", "GLUCOSE", "BUN", "CREATININE", "CALCIUM", "MG", "PHOS" in the last 168 hours. GFR CrCl cannot be calculated (Patient's most recent lab result is older than the maximum 21 days allowed.). Liver Function Tests: No results for input(s): "AST", "ALT", "ALKPHOS", "BILITOT", "PROT", "ALBUMIN" in the last 168 hours. No results for input(s): "LIPASE", "AMYLASE" in the last 168 hours. No results for input(s): "AMMONIA" in the last 168 hours. Coagulation profile No results for input(s): "INR", "PROTIME" in the last 168 hours.  Cardiac Enzymes: No results for input(s): "CKTOTAL", "CKMB", "CKMBINDEX", "TROPONINI" in the last 168 hours. BNP: Invalid input(s): "POCBNP" CBG: No results for input(s): "GLUCAP" in the last 168 hours. D-Dimer No results for input(s): "DDIMER" in the last 72 hours. Hgb A1c No results for input(s): "HGBA1C" in the last 72 hours. Lipid Profile No results for input(s): "CHOL", "HDL", "LDLCALC", "TRIG", "CHOLHDL", "LDLDIRECT" in the last 72 hours. Thyroid function studies No results for input(s): "TSH", "T4TOTAL", "T3FREE", "THYROIDAB" in the last 72 hours.  Invalid input(s): "FREET3" Anemia work up No results for input(s): "VITAMINB12", "FOLATE", "FERRITIN", "TIBC", "IRON", "RETICCTPCT" in the last 72 hours. Urinalysis    Component Value Date/Time   COLORURINE YELLOW (A) 03/31/2022 1723   APPEARANCEUR CLOUDY (A) 03/31/2022 1723   LABSPEC 1.014 03/31/2022 1723   PHURINE 7.0 03/31/2022 1723   GLUCOSEU NEGATIVE 03/31/2022 1723   HGBUR MODERATE (A) 03/31/2022 1723   BILIRUBINUR NEGATIVE 03/31/2022 1723   KETONESUR NEGATIVE 03/31/2022 1723   PROTEINUR 30 (A) 03/31/2022 1723   NITRITE POSITIVE (A) 03/31/2022 1723   LEUKOCYTESUR LARGE (A) 03/31/2022 1723     Microbiology Recent Results (from the past 240 hour(s))  SARS Coronavirus 2 by RT  PCR (hospital order, performed in [place] hospital lab) *cepheid single result test* Anterior Nasal Swab     Status: None   Collection Time: 09/27/22  5:37 PM   Specimen: Anterior Nasal Swab  Result Value Ref Range Status   SARS Coronavirus 2 by RT PCR NEGATIVE NEGATIVE Final    Comment: (NOTE) SARS-CoV-2 target nucleic acids are NOT DETECTED.  The SARS-CoV-2 RNA is generally detectable in upper and lower respiratory specimens during the acute phase of infection. The lowest concentration of SARS-CoV-2 viral copies this assay can detect is 250 copies / mL. A negative result does not preclude SARS-CoV-2 infection and should not be used as the sole basis for treatment or other patient management decisions.  A negative result may occur with improper specimen collection / handling, submission of specimen other than nasopharyngeal swab, presence of viral mutation(s) within the areas targeted by this assay, and inadequate number of viral copies (<250 copies / mL). A negative result must be combined with clinical observations, patient history, and epidemiological information.  Fact Sheet for Patients:   https://www.patel.info/  Fact Sheet for Healthcare  Providers: http://kim-miller.com/  This test is not yet approved or  cleared by the [place] and has been authorized for detection and/or diagnosis of SARS-CoV-2 by FDA under an Emergency Use Authorization (EUA).  This EUA will remain in effect (meaning this test can be used) for the duration of the COVID-19 declaration under Section 564(b)(1) of the Act, 21 U.S.C. section 360bbb-3(b)(1), unless the authorization is terminated or revoked sooner.  Performed at [facility], [address] [place], [place] [address]        Inpatient Medications:   Scheduled Meds:  aspirin EC  81 mg Oral Daily   atorvastatin  20 mg Oral Daily   atropine  1 drop Sublingual QID    benztropine  0.5 mg Oral BID   cloZAPine  300 mg Oral QHS   feeding supplement  237 mL Oral TID BM   gabapentin  300 mg Oral TID   haloperidol  2 mg Oral TID   lisinopril  5 mg Oral Daily   metoprolol succinate  25 mg Oral Daily   paliperidone  156 mg Intramuscular Q28 days   temazepam  15 mg Oral QHS   Continuous Infusions:   Radiological Exams on Admission: No results found.  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  Denies suicidal homicidal ideations. -Management per primary psychiatry team -Patient is on Haldol 2 mg TID Clozaril 300 mg at bedtime Invega 156 mg monthly Benztropine 0.5 mg twice daily As needed Geodon   Anxiety: Gabapentin 300 mg TID Klonopin 1 mg TID   Chronic systolic CHF (congestive heart failure) (HCC): 2D echo on 09/21/2021 showed EF of 45-50% with grade 1 diastolic dysfunction.  Patient does not have leg edema, JVD, shortness breath.  CHF is compensated clinically. -No diuretics needed now.  HTN (hypertension): Blood pressure 120/86 -Continue metoprolol 25 mg daily and lisinopril 5 mg daily  HLD (hyperlipidemia) -Lipitor  Stroke [facility]): Has chronic mild difficulty speaking.  No acute issues. 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 -Observe closely  Medical clearance: Patient does not have any complaints.  Blood pressure 120/86.  No any chest pain, cough, shortness  breath.  No any abdominal pain, nausea or vomiting.  Patient has chronic difficulty speaking, which has not changed.  Most importantly, patient has history of CHF with EF of 45-50% with grade 1 diastolic dysfunction, but the patient does not have any shortness breath, no leg edema or JVD.  Clinically CHF is compensated.  Patient is medically stable for discharge.    Thank you for this consultation. We will sign off now. Please call back if need further assistance.    Time Spent:  35 min     Lorretta Harp M.D. [facility] 10/05/2022, 1:07 PM

## 2022-10-06 NOTE — Plan of Care (Signed)
Pt denies anxiety/depression at this time. Pt denies SI/HI/AVH or pain at this time. Pt is calm and cooperative. Pt is medication compliant. Pt provided with support and encouragement. Pt monitored q15 minutes for safety per unit policy. Plan of care ongoing.   Problem: Self-Concept: Goal: Level of anxiety will decrease Outcome: Progressing   Problem: Nutrition: Goal: Adequate nutrition will be maintained Outcome: Progressing   

## 2022-10-06 NOTE — Progress Notes (Signed)
[facility] MD Progress Note  10/06/2022 2:17 PM Adam [name]  MRN:  [id number] Subjective: Patient seen and chart reviewed.  Patient has no new complaints.  No change to any of his behaviors.  I reviewed with the patient that the medical consult was completed yesterday and internal medicine did not believe there was any need for any further evaluation or changes in treatment at this time.  Patient displays positive affect and appears to understand the plan for possible discharge and is pleased. 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)    Stroke ([place])    History reviewed. No pertinent surgical history. Family History: History reviewed. No pertinent family history. Family Psychiatric  History: See previous Social History:  Social History   Substance and Sexual Activity  Alcohol Use Not Currently     Social History   Substance and Sexual Activity  Drug Use Not Currently    Social History   Socioeconomic History   Marital status: Single    Spouse name: Not on file   Number of children: Not on file   Years of education: Not on file   Highest education level: Not on file  Occupational History   Not on file  Tobacco Use   Smoking status: Never    Passive exposure: Never   Smokeless tobacco: Never  Vaping Use   Vaping Use: Unknown  Substance and Sexual Activity   Alcohol use: Not Currently    Drug use: Not Currently   Sexual activity: Not Currently  Other Topics Concern   Not on file  Social History Narrative   Not on file   Social Determinants of Health   Financial Resource Strain: Not on file  Food Insecurity: Not on file  Transportation Needs: Not on file  Physical Activity: Not on file  Stress: Not on file  Social Connections: Not on file   Additional Social History:  Specify valuables returned:  (none)                      Sleep: Fair  Appetite:  Fair  Current Medications: Current Facility-Administered Medications  Medication Dose Route Frequency Provider Last Rate Last Admin   acetaminophen (TYLENOL) tablet 650 mg  650 mg Oral Q6H PRN Kanija Remmel T, MD   650 mg at 09/09/22 1813   alum & mag hydroxide-simeth (MAALOX/MYLANTA) 200-200-20 MG/5ML suspension 30 mL  30 mL Oral Q4H PRN Ciela Mahajan T, MD   30 mL at 04/26/22 4098   aspirin EC tablet 81 mg  81 mg Oral Daily Orien Mayhall T, MD   81 mg at 10/06/22 1001   atorvastatin (LIPITOR) tablet 20 mg  20 mg Oral Daily Delma Villalva T, MD   20 mg at 10/06/22 1001   atropine 1 % ophthalmic solution 1 drop  1 drop Sublingual QID [name], MD   1 drop at 10/06/22 1001   benztropine (COGENTIN) tablet 0.5 mg  0.5 mg Oral  BID Wenonah Milo, [name], MD   0.5 mg at 10/06/22 1001   clonazePAM (KLONOPIN) tablet 1 mg  1 mg Oral TID PRN He, [name], MD   1 mg at 09/28/22 2130   cloZAPine (CLOZARIL) tablet 300 mg  300 mg Oral QHS Julio Zappia T, MD   300 mg at 10/05/22 2100   diphenhydrAMINE (BENADRYL) capsule 50 mg  50 mg Oral Q6H PRN Vladimir Lenhoff T, MD   50 mg at 09/30/22 2119   Or   diphenhydrAMINE (BENADRYL) injection 50 mg  50 mg Intramuscular Q6H PRN Kayton Ripp T, MD       feeding supplement (ENSURE ENLIVE / ENSURE PLUS) liquid 237 mL  237 mL Oral TID BM Dazha Kempa T, MD   237 mL at 10/06/22 1001   gabapentin (NEURONTIN) capsule 300 mg  300 mg Oral TID Jason Frisbee T, MD   300 mg at 10/06/22 1001    haloperidol (HALDOL) tablet 2 mg  2 mg Oral TID Philopater Mucha T, MD   2 mg at 10/06/22 1001   haloperidol (HALDOL) tablet 5 mg  5 mg Oral Q6H PRN Shoaib Siefker T, MD   5 mg at 09/17/22 1532   Or   haloperidol lactate (HALDOL) injection 5 mg  5 mg Intramuscular Q6H PRN Shaylan Tutton T, MD       lisinopril (ZESTRIL) tablet 5 mg  5 mg Oral Daily Selassie Spatafore T, MD   5 mg at 10/06/22 1001   magnesium hydroxide (MILK OF MAGNESIA) suspension 30 mL  30 mL Oral Daily PRN Jamilex Bohnsack T, MD   30 mL at 09/15/22 1047   metoprolol succinate (TOPROL-XL) 24 hr tablet 25 mg  25 mg Oral Daily Ramirez Fullbright T, MD   25 mg at 10/06/22 1001   ondansetron (ZOFRAN) tablet 4 mg  4 mg Oral Q8H PRN [name], NP   4 mg at 09/06/22 2320   paliperidone (INVEGA SUSTENNA) injection 156 mg  156 mg Intramuscular Q28 days Dayanne Yiu, [name], MD   156 mg at 09/13/22 [id number]   senna-docusate (Senokot-S) tablet 2 tablet  2 tablet Oral QHS PRN [name], DO   2 tablet at 09/15/22 2110   temazepam (RESTORIL) capsule 15 mg  15 mg Oral QHS Markey Deady T, MD   15 mg at 10/05/22 2100   ziprasidone (GEODON) injection 20 mg  20 mg Intramuscular Q12H PRN Remonia Otte, [name], MD   20 mg at 07/08/22 1453    Lab Results: No results found for this or any previous visit (from the past 77 hour(s)).  Blood Alcohol level:  Lab Results  Component Value Date   ETH <10 [id number]    Metabolic Disorder Labs: Lab Results  Component Value Date   HGBA1C 5.5 04/10/2022   MPG 111.15 04/10/2022   No results found for: "PROLACTIN" Lab Results  Component Value Date   CHOL 167 11/20/2021   TRIG 107 11/20/2021   HDL 26 (L) 11/20/2021   CHOLHDL 6.4 11/20/2021   VLDL 21 11/20/2021   LDLCALC 120 (H) 11/20/2021    Physical Findings: AIMS: Facial and Oral Movements Muscles of Facial Expression: None, normal Lips and Perioral Area: None, normal Jaw: None, normal Tongue: None, normal,Extremity Movements Upper (arms, wrists,  hands, fingers): None, normal Lower (legs, knees, ankles, toes): None, normal, Trunk Movements Neck, shoulders, hips: None, normal, Overall Severity Severity of abnormal movements (highest score from questions above): None, normal Incapacitation due to abnormal movements: None, normal  Patient's awareness of abnormal movements (rate only patient's report): No Awareness, Dental Status Current problems with teeth and/or dentures?: No Does patient usually wear dentures?: No  CIWA:    COWS:     Musculoskeletal: Strength & Muscle Tone: within normal limits Gait & Station: normal Patient leans: N/A  Psychiatric Specialty Exam:  Presentation  General Appearance:  Appropriate for Environment; Casual; Neat; Well Groomed  Eye Contact: Fair  Speech: Slow  Speech Volume: Decreased  Handedness: Left   Mood and Affect  Mood: Anxious  Affect: Congruent   Thought Process  Thought Processes: Goal Directed  Descriptions of Associations:Circumstantial  Orientation:Partial  Thought Content:Scattered  History of Schizophrenia/Schizoaffective disorder:Yes  Duration of Psychotic Symptoms:Greater than six months  Hallucinations:No data recorded Ideas of Reference:None  Suicidal Thoughts:No data recorded Homicidal Thoughts:No data recorded  Sensorium  Memory: Remote Good  Judgment: Fair  Insight: Fair   Community education officer  Concentration: Fair  Attention Span: Fair  Recall: [employer] of Knowledge: Fair  Language: Fair   Psychomotor Activity  Psychomotor Activity:No data recorded  Assets  Assets:No data recorded  Sleep  Sleep:No data recorded   Physical Exam: Physical Exam Vitals and nursing note reviewed.  Constitutional:      Appearance: Normal appearance.  HENT:     Head: Normocephalic and atraumatic.     Mouth/Throat:     Pharynx: Oropharynx is clear.  Eyes:     Pupils: Pupils are equal, round, and reactive to light.   Cardiovascular:     Rate and Rhythm: Normal rate and regular rhythm.  Pulmonary:     Effort: Pulmonary effort is normal.     Breath sounds: Normal breath sounds.  Abdominal:     General: Abdomen is flat.     Palpations: Abdomen is soft.  Musculoskeletal:        General: Normal range of motion.  Skin:    General: Skin is warm and dry.  Neurological:     General: No focal deficit present.     Mental Status: He is alert. Mental status is at baseline.  Psychiatric:        Attention and Perception: Attention normal.        Mood and Affect: Mood normal. Affect is blunt.        Speech: He is noncommunicative.    Review of Systems  Unable to perform ROS: Language   Blood pressure 99/78, pulse 91, temperature 98 F (36.7 C), temperature source Oral, resp. rate 16, height 5\' 8"  (1.727 m), weight 85.8 kg, SpO2 99 %. Body mass index is 28.76 kg/m.   Treatment Plan Summary: Plan we continue to hope that all the pieces will come together this upcoming week for discharge.  I am worried about whether they will be able to do the continuation of clozapine correctly.  As soon as we know a date for discharge I will check another CBC with differential so that we have at least 2 weeks leeway in making sure he can take his medicine.  No other change to treatment today.  Alethia Berthold, MD 10/06/2022, 2:17 PM

## 2022-10-06 NOTE — Plan of Care (Signed)
D- Patient alert and oriented to person, place, and situation. Patient presented in a pleasant mood on assessment stating that he slept ok last night and had no complaints to voice to this Probation officer. Patient is childlike due to a past stroke, and has expressive aphasia. Patient has been pointing to the post about the [place] shot and is saying "no, no way". Patient usually would ask staff for his shot, even when it wasn't time, but now he is adamant that he doesn't want it. Patient denied SI, HI, AVH, and pain at this time. Patient also denied any signs/symptoms of depression and anxiety, shaking his head "no". Patient had no stated goals for today.  A- Scheduled medications administered to patient, per MD orders. Support and encouragement provided.  Routine safety checks conducted every 15 minutes.  Patient informed to notify staff with problems or concerns.  R- No adverse drug reactions noted. Patient contracts for safety at this time. Patient compliant with medications. Patient receptive, calm, and cooperative. Patient remains safe at this time.  Problem: Education: Goal: Ability to state activities that reduce stress will improve Outcome: Progressing   Problem: Coping: Goal: Ability to identify and develop effective coping behavior will improve Outcome: Progressing   Problem: Self-Concept: Goal: Ability to identify factors that promote anxiety will improve Outcome: Progressing Goal: Level of anxiety will decrease Outcome: Progressing Goal: Ability to modify response to factors that promote anxiety will improve Outcome: Progressing   Problem: Education: Goal: Knowledge of General Education information will improve Description: Including pain rating scale, medication(s)/side effects and non-pharmacologic comfort measures Outcome: Progressing   Problem: Health Behavior/Discharge Planning: Goal: Ability to manage health-related needs will improve Outcome: Progressing   Problem: Clinical  Measurements: Goal: Ability to maintain clinical measurements within normal limits will improve Outcome: Progressing Goal: Will remain free from infection Outcome: Progressing Goal: Diagnostic test results will improve Outcome: Progressing Goal: Respiratory complications will improve Outcome: Progressing Goal: Cardiovascular complication will be avoided Outcome: Progressing   Problem: Activity: Goal: Risk for activity intolerance will decrease Outcome: Progressing   Problem: Nutrition: Goal: Adequate nutrition will be maintained Outcome: Progressing   Problem: Coping: Goal: Level of anxiety will decrease Outcome: Progressing   Problem: Elimination: Goal: Will not experience complications related to bowel motility Outcome: Progressing Goal: Will not experience complications related to urinary retention Outcome: Progressing   Problem: Pain Managment: Goal: General experience of comfort will improve Outcome: Progressing   Problem: Safety: Goal: Ability to remain free from injury will improve Outcome: Progressing   Problem: Skin Integrity: Goal: Risk for impaired skin integrity will decrease Outcome: Progressing   Problem: Activity: Goal: Interest or engagement in leisure activities will improve Outcome: Progressing   Problem: Coping: Goal: Coping ability will improve Outcome: Progressing   Problem: Education: Goal: Will be free of psychotic symptoms Outcome: Progressing

## 2022-10-07 NOTE — Plan of Care (Signed)
D- Patient alert and oriented to person, place, and situation. Patient initially presented in an irritable mood because the name plate on his door was broken and he was upset and yelling out. However, staff was able to replace it and afterwards he became more pleasant. Patient stated that he slept ok when asked and had no complaints to voice to this Probation officer. 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 Patient denied SI, HI, AVH, and pain at this time. Patient also denied any signs/symptoms of depression/anxiety. Patient had no stated goals for today, but he has been fixated on [name] Iommi and [employer]. Patient has been present in the milieu majority of the day, watching football on television, with the other members on the unit.   A- Scheduled medications administered to patient, per MD orders. Support and encouragement provided.  Routine safety checks conducted every 15 minutes.  Patient informed to notify staff with problems or concerns.  R- No adverse drug reactions noted. Patient contracts for safety at this time. Patient compliant with medications. Patient receptive, calm, and cooperative. Patient interacts well with others on the unit. Patient remains safe at this time.  Problem: Education: Goal: Ability to state activities that reduce stress will improve Outcome: Progressing   Problem: Coping: Goal: Ability to identify and develop effective coping behavior will improve Outcome: Progressing   Problem: Self-Concept: Goal: Ability to identify factors that promote anxiety will improve Outcome: Progressing Goal: Level of anxiety will decrease Outcome: Progressing Goal: Ability to modify response to factors that promote anxiety will improve Outcome: Progressing   Problem: Education: Goal: Knowledge of General Education information will improve Description: Including pain rating scale, medication(s)/side effects and non-pharmacologic comfort measures Outcome:  Progressing   Problem: Health Behavior/Discharge Planning: Goal: Ability to manage health-related needs will improve Outcome: Progressing   Problem: Clinical Measurements: Goal: Ability to maintain clinical measurements within normal limits will improve Outcome: Progressing Goal: Will remain free from infection Outcome: Progressing Goal: Diagnostic test results will improve Outcome: Progressing Goal: Respiratory complications will improve Outcome: Progressing Goal: Cardiovascular complication will be avoided Outcome: Progressing   Problem: Activity: Goal: Risk for activity intolerance will decrease Outcome: Progressing   Problem: Nutrition: Goal: Adequate nutrition will be maintained Outcome: Progressing   Problem: Coping: Goal: Level of anxiety will decrease Outcome: Progressing   Problem: Elimination: Goal: Will not experience complications related to bowel motility Outcome: Progressing Goal: Will not experience complications related to urinary retention Outcome: Progressing   Problem: Pain Managment: Goal: General experience of comfort will improve Outcome: Progressing   Problem: Safety: Goal: Ability to remain free from injury will improve Outcome: Progressing   Problem: Skin Integrity: Goal: Risk for impaired skin integrity will decrease Outcome: Progressing   Problem: Activity: Goal: Interest or engagement in leisure activities will improve Outcome: Progressing   Problem: Coping: Goal: Coping ability will improve Outcome: Progressing   Problem: Education: Goal: Will be free of psychotic symptoms Outcome: Progressing

## 2022-10-07 NOTE — [facility] Group Notes (Signed)
LCSW Group Therapy Note  10/07/2022   8:54 -9:40 AM  Type of Therapy and Topic:  Group Therapy: Anger Cues and Responses  Participation Level:  Minimal   Description of Group:   In this group, patients learned how to recognize the physical, cognitive, emotional, and behavioral responses they have to anger-provoking situations.  They identified a recent time they became angry and how they reacted.  They analyzed how their reaction was possibly beneficial and how it was possibly unhelpful.  The group discussed a variety of healthier coping skills that could help with such a situation in the future.  They also learned that anger is a second emotion fueled by other feelings and explored their own emotions that may frequently fuel their anger.  Focus was placed on how helpful it is to recognize the underlying emotions to our anger, because working on those can lead to a more permanent solution as well as our ability to focus on the important rather than the urgent.  Therapeutic Goals: Patients will remember their last incident of anger and how they felt emotionally and physically, what their thoughts were at the time, and how they behaved. Patients will identify how their behavior at that time worked for them, as well as how it worked against them. Patients will explore possible new behaviors to use in future anger situations. Patients will learn that anger itself is normal and cannot be eliminated, and that healthier reactions can assist with resolving conflict rather than worsening situations. Patients will learn that anger is a secondary emotion and worked to identify some of the underlying feelings that may lead to anger.  Summary of Patient Progress:  The patient was present in group however, would only keep repeating "college" and team names during session. Patient would shake his head no when asked if he wanted to share anything.   Therapeutic Modalities:   Cognitive Behavioral  Therapy  Tye Savoy

## 2022-10-07 NOTE — Progress Notes (Signed)
[name] MD Progress Note  10/07/2022 10:52 AM Adam [name]  MRN:  [id number] Subjective: Follow-up for 61 year old man with schizophrenia.  No new complaints.  No change to behavior.  Continues to be completely stable.  As noted before we appear to be in a situation of having a placement arranged and just needing all of the details ironed out. 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 No pertinent surgical history. Family History: History reviewed. No pertinent family history. Family Psychiatric  History: See previous Social History:  Social History   Substance and Sexual Activity  Alcohol Use Not Currently     Social History   Substance and Sexual Activity  Drug Use Not Currently    Social History   Socioeconomic History   Marital status: Single    Spouse name: Not on file   Number of children: Not on file   Years of education: Not on file   Highest education level: Not on file  Occupational History   Not on file  Tobacco Use   Smoking status: Never    Passive exposure: Never   Smokeless tobacco: Never  Vaping Use   Vaping Use: Unknown  Substance and Sexual Activity   Alcohol use: Not Currently   Drug use: Not Currently   Sexual activity: Not Currently  Other Topics Concern   Not on file  Social History Narrative   Not on file    Social Determinants of Health   Financial Resource Strain: Not on file  Food Insecurity: Not on file  Transportation Needs: Not on file  Physical Activity: Not on file  Stress: Not on file  Social Connections: Not on file   Additional Social History:  Specify valuables returned:  (none)                      Sleep: Fair  Appetite:  Fair  Current Medications: Current Facility-Administered Medications  Medication Dose Route Frequency Provider Last Rate Last Admin   acetaminophen (TYLENOL) tablet 650 mg  650 mg Oral Q6H PRN Gricelda Foland T, MD   650 mg at 09/09/22 1813   alum & mag hydroxide-simeth (MAALOX/MYLANTA) 200-200-20 MG/5ML suspension 30 mL  30 mL Oral Q4H PRN Kashius Dominic T, MD   30 mL at 04/26/22 8527   aspirin EC tablet 81 mg  81 mg Oral Daily Ibrahima Holberg, [name], MD   81 mg at 10/07/22 0827   atorvastatin (LIPITOR) tablet 20 mg  20 mg Oral Daily Jessice Madill T, MD   20 mg at 10/07/22 0827   atropine 1 % ophthalmic solution 1 drop  1 drop Sublingual QID [name], MD   1 drop at 10/07/22 0827   benztropine (COGENTIN) tablet 0.5 mg  0.5 mg Oral BID Demarius Archila T, MD   0.5 mg at 10/07/22 0827   clonazePAM (KLONOPIN) tablet 1 mg  1 mg Oral TID  PRN He, [name], MD   1 mg at 09/28/22 2130   cloZAPine (CLOZARIL) tablet 300 mg  300 mg Oral QHS Morningstar Toft T, MD   300 mg at 10/06/22 2100   diphenhydrAMINE (BENADRYL) capsule 50 mg  50 mg Oral Q6H PRN Tashaya Ancrum T, MD   50 mg at 09/30/22 2119   Or   diphenhydrAMINE (BENADRYL) injection 50 mg  50 mg Intramuscular Q6H PRN Chinyere Galiano T, MD       feeding supplement (ENSURE ENLIVE / ENSURE PLUS) liquid 237 mL  237 mL Oral TID BM Ricky Doan T, MD   237 mL at 10/06/22 1001   gabapentin (NEURONTIN) capsule 300 mg  300 mg Oral TID Gayleen Sholtz T, MD   300 mg at 10/07/22 9528   haloperidol (HALDOL) tablet 2 mg  2 mg Oral TID Keasia Dubose, [name], MD   2 mg at 10/07/22 4132   haloperidol (HALDOL) tablet 5 mg  5 mg Oral Q6H PRN  Mikaelah Trostle T, MD   5 mg at 09/17/22 1532   Or   haloperidol lactate (HALDOL) injection 5 mg  5 mg Intramuscular Q6H PRN Gussie Murton T, MD       lisinopril (ZESTRIL) tablet 5 mg  5 mg Oral Daily Emerald Gehres T, MD   5 mg at 10/07/22 0827   magnesium hydroxide (MILK OF MAGNESIA) suspension 30 mL  30 mL Oral Daily PRN Kym Fenter T, MD   30 mL at 09/15/22 1047   metoprolol succinate (TOPROL-XL) 24 hr tablet 25 mg  25 mg Oral Daily Yanai Hobson T, MD   25 mg at 10/07/22 0827   ondansetron (ZOFRAN) tablet 4 mg  4 mg Oral Q8H PRN Lerry Liner M, NP   4 mg at 09/06/22 2320   paliperidone (INVEGA SUSTENNA) injection 156 mg  156 mg Intramuscular Q28 days Naziah Portee, [name], MD   156 mg at 09/13/22 4401   senna-docusate (Senokot-S) tablet 2 tablet  2 tablet Oral QHS PRN Sarina Ill, DO   2 tablet at 09/15/22 2110   temazepam (RESTORIL) capsule 15 mg  15 mg Oral QHS Yulisa Chirico T, MD   15 mg at 10/06/22 2100   ziprasidone (GEODON) injection 20 mg  20 mg Intramuscular Q12H PRN Nikka Hakimian, [name], MD   20 mg at 07/08/22 1453    Lab Results: No results found for this or any previous visit (from the past 48 hour(s)).  Blood Alcohol level:  Lab Results  Component Value Date   ETH <10 07/20/2021    Metabolic Disorder Labs: Lab Results  Component Value Date   HGBA1C 5.5 04/10/2022   MPG 111.15 04/10/2022   No results found for: "PROLACTIN" Lab Results  Component Value Date   CHOL 167 11/20/2021   TRIG 107 11/20/2021   HDL 26 (L) 11/20/2021   CHOLHDL 6.4 11/20/2021   VLDL 21 11/20/2021   LDLCALC 120 (H) 11/20/2021    Physical Findings: AIMS: Facial and Oral Movements Muscles of Facial Expression: None, normal Lips and Perioral Area: None, normal Jaw: None, normal Tongue: None, normal,Extremity Movements Upper (arms, wrists, hands, fingers): None, normal Lower (legs, knees, ankles, toes): None, normal, Trunk Movements Neck, shoulders, hips: None, normal, Overall  Severity Severity of abnormal movements (highest score from questions above): None, normal Incapacitation due to abnormal movements: None, normal Patient's awareness of abnormal movements (rate only patient's report): No Awareness, Dental Status Current problems with teeth and/or dentures?: No Does patient usually wear  dentures?: No  CIWA:    COWS:     Musculoskeletal: Strength & Muscle Tone: within normal limits Gait & Station: normal Patient leans: N/A  Psychiatric Specialty Exam:  Presentation  General Appearance:  Appropriate for Environment; Casual; Neat; Well Groomed  Eye Contact: Fair  Speech: Slow  Speech Volume: Decreased  Handedness: Left   Mood and Affect  Mood: Anxious  Affect: Congruent   Thought Process  Thought Processes: Goal Directed  Descriptions of Associations:Circumstantial  Orientation:Partial  Thought Content:Scattered  History of Schizophrenia/Schizoaffective disorder:Yes  Duration of Psychotic Symptoms:Greater than six months  Hallucinations:No data recorded Ideas of Reference:None  Suicidal Thoughts:No data recorded Homicidal Thoughts:No data recorded  Sensorium  Memory: Remote Good  Judgment: Fair  Insight: Fair   Art therapist  Concentration: Fair  Attention Span: Fair  Recall: Fair  Fund of Knowledge: Fair  Language: Fair   Psychomotor Activity  Psychomotor Activity:No data recorded  Assets  Assets:No data recorded  Sleep  Sleep:No data recorded   Physical Exam: Physical Exam Vitals reviewed.  Constitutional:      Appearance: Normal appearance.  HENT:     Head: Normocephalic and atraumatic.     Mouth/Throat:     Pharynx: Oropharynx is clear.  Eyes:     Pupils: Pupils are equal, round, and reactive to light.  Cardiovascular:     Rate and Rhythm: Normal rate and regular rhythm.  Pulmonary:     Effort: Pulmonary effort is normal.     Breath sounds: Normal breath sounds.   Abdominal:     General: Abdomen is flat.     Palpations: Abdomen is soft.  Musculoskeletal:        General: Normal range of motion.  Skin:    General: Skin is warm and dry.  Neurological:     General: No focal deficit present.     Mental Status: He is alert. Mental status is at baseline.  Psychiatric:        Attention and Perception: He is inattentive.        Speech: He is noncommunicative.    Review of Systems  Unable to perform ROS: Language   Blood pressure 98/72, pulse 91, temperature 98.2 F (36.8 C), temperature source Oral, resp. rate 18, height 5\' 8"  (1.727 m), weight 85.8 kg, SpO2 99 %. Body mass index is 28.76 kg/m.   Treatment Plan Summary: Plan extraordinarily lengthy hospital stay hopefully finally coming to an end.  As soon as we get specifics about discharge planning we can put everything into the operation to get him to his group home.  Patient is patient and waiting.  , MD 10/07/2022, 10:52 AM

## 2022-10-08 NOTE — Progress Notes (Signed)
The patient was visible in the dayroom during the evening, he was noted to interact well with peers and staff. He was cooperative with treatment and medication. He seemed to rest well through out the night with no new behavioral issues to report on shift at this time.

## 2022-10-08 NOTE — Group Note (Signed)
LCSW Group Therapy Note  Group Date: 10/08/2022 Start Time: 1300 End Time: 1400   Type of Therapy and Topic:  Group Therapy - How To Cope with Nervousness about Discharge   Participation Level:  Did Not Attend   Description of Group This process group involved identification of patients' feelings about discharge. Some of them are scheduled to be discharged soon, while others are new admissions, but each of them was asked to share thoughts and feelings surrounding discharge from the hospital. One common theme was that they are excited at the prospect of going home, while another was that many of them are apprehensive about sharing why they were hospitalized. Patients were given the opportunity to discuss these feelings with their peers in preparation for discharge.  Therapeutic Goals  Patient will identify their overall feelings about pending discharge. Patient will think about how they might proactively address issues that they believe will once again arise once they get home (i.e. with parents). Patients will participate in discussion about having hope for change.   Summary of Patient Progress:   Group offered, patient declined to attend.   Therapeutic Modalities Cognitive Behavioral Therapy   Adam [name] 10/08/2022  2:14 PM

## 2022-10-08 NOTE — [facility] Group Notes (Signed)
[facility] Group Notes:  (Nursing/MHT/Case Management/Adjunct)  Date:  10/08/2022  Time:  9:48 AM  Type of Therapy:  Group Therapy  Participation Level:  Did Not Attend    [name],Adam Hanners E 10/08/2022, 9:48 AM

## 2022-10-08 NOTE — Progress Notes (Signed)
Patient received his scheduled morning medication at 12 noon, when he woke up. This writer moved his scheduled 1500 medication to 1700. MD will be notified.

## 2022-10-08 NOTE — Progress Notes (Signed)
Patient is still sleeping and this writer will administer scheduled medications once patient wakes up. MD was notified during progression rounds.

## 2022-10-08 NOTE — Progress Notes (Signed)
Recreation Therapy Notes   Date: 10/08/2022  Time: 10:35 am     Location: Craft room   Behavioral response: N/A   Intervention Topic: Stress Management   Discussion/Intervention: Patient refused to attend group.   Clinical Observations/Feedback:  Patient refused to attend group.    Brytani Voth LRT/CTRS         Tyyne Cliett 10/08/2022 1:15 PM

## 2022-10-08 NOTE — Plan of Care (Signed)
D- Patient alert and oriented to person, place, and situation. Patient initially presented in an irritable mood on assessment because he spilled some sauce on his [employer] sweatshirt and was agitated. This Probation officer told patient all that needed to be done was to put it in the washer. Once this writer helped patient take off his sweatshirt and placed it in the washer, along with his other clothes, his affect brightened up. Patient is childlike and has expressive aphasia, due to a past stroke. Patient stated that he slept ok last night and had no complaints to voice to this Probation officer. Patient denied SI, HI, AVH, and pain at this time. Patient also denied any signs/symptoms of depression and anxiety. Patient had no stated goals for today.  A- Scheduled medications administered to patient, per MD orders. Support and encouragement provided.  Routine safety checks conducted every 15 minutes.  Patient informed to notify staff with problems or concerns.  R- No adverse drug reactions noted. Patient contracts for safety at this time. Patient compliant with medications and treatment plan. Patient receptive, calm, and cooperative. Patient interacts well with others on the unit. Patient remains safe at this time.  Problem: Education: Goal: Ability to state activities that reduce stress will improve Outcome: Progressing   Problem: Coping: Goal: Ability to identify and develop effective coping behavior will improve Outcome: Progressing   Problem: Self-Concept: Goal: Ability to identify factors that promote anxiety will improve Outcome: Progressing Goal: Level of anxiety will decrease Outcome: Progressing Goal: Ability to modify response to factors that promote anxiety will improve Outcome: Progressing   Problem: Education: Goal: Knowledge of General Education information will improve Description: Including pain rating scale, medication(s)/side effects and non-pharmacologic comfort measures Outcome:  Progressing   Problem: Health Behavior/Discharge Planning: Goal: Ability to manage health-related needs will improve Outcome: Progressing   Problem: Clinical Measurements: Goal: Ability to maintain clinical measurements within normal limits will improve Outcome: Progressing Goal: Will remain free from infection Outcome: Progressing Goal: Diagnostic test results will improve Outcome: Progressing Goal: Respiratory complications will improve Outcome: Progressing Goal: Cardiovascular complication will be avoided Outcome: Progressing   Problem: Activity: Goal: Risk for activity intolerance will decrease Outcome: Progressing   Problem: Nutrition: Goal: Adequate nutrition will be maintained Outcome: Progressing   Problem: Coping: Goal: Level of anxiety will decrease Outcome: Progressing   Problem: Elimination: Goal: Will not experience complications related to bowel motility Outcome: Progressing Goal: Will not experience complications related to urinary retention Outcome: Progressing   Problem: Pain Managment: Goal: General experience of comfort will improve Outcome: Progressing   Problem: Safety: Goal: Ability to remain free from injury will improve Outcome: Progressing   Problem: Skin Integrity: Goal: Risk for impaired skin integrity will decrease Outcome: Progressing   Problem: Activity: Goal: Interest or engagement in leisure activities will improve Outcome: Progressing   Problem: Coping: Goal: Coping ability will improve Outcome: Progressing   Problem: Education: Goal: Will be free of psychotic symptoms Outcome: Progressing

## 2022-10-08 NOTE — Progress Notes (Signed)
Patient's 2000 medication has been moved to 2200, due to the fact that he has received his medications late because he didn't get up until noon. This information will be passed on to the oncoming shift as well.

## 2022-10-08 NOTE — Progress Notes (Signed)
Patient calm and cooperative during assessment denying SI/HI/AVH. Patient observed interacting appropriately with staff and peers on the unit. Patient compliant with medication administration per MD orders. Pt being monitored Q 15 minutes for safety per unit protocol, remains safe on the unit

## 2022-10-08 NOTE — Progress Notes (Signed)
[facility] MD Progress Note  10/08/2022 2:51 PM Adam [name]  MRN:  [id number] Subjective: Patient seen and chart reviewed.  No new complaints no changes in behavior.  Completely stable. 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 No pertinent surgical history. Family History: History reviewed. No pertinent family history. Family Psychiatric  History: See previous Social History:  Social History   Substance and Sexual Activity  Alcohol Use Not Currently     Social History   Substance and Sexual Activity  Drug Use Not Currently    Social History   Socioeconomic History   Marital status: Single    Spouse name: Not on file   Number of children: Not on file   Years of education: Not on file   Highest education level: Not on file  Occupational History   Not on file  Tobacco Use   Smoking status: Never    Passive exposure: Never   Smokeless tobacco: Never  Vaping Use   Vaping Use: Unknown  Substance and Sexual Activity   Alcohol use: Not Currently   Drug use: Not Currently   Sexual activity: Not Currently  Other Topics Concern   Not on file  Social History Narrative   Not on file   Social Determinants of Health   Financial Resource Strain: Not on file  Food Insecurity: Not on file  Transportation Needs: Not on file  Physical Activity: Not on  file  Stress: Not on file  Social Connections: Not on file   Additional Social History:  Specify valuables returned:  (none)                      Sleep: Fair  Appetite:  Fair  Current Medications: Current Facility-Administered Medications  Medication Dose Route Frequency Provider Last Rate Last Admin   acetaminophen (TYLENOL) tablet 650 mg  650 mg Oral Q6H PRN Reonna Finlayson T, MD   650 mg at 09/09/22 1813   alum & mag hydroxide-simeth (MAALOX/MYLANTA) 200-200-20 MG/5ML suspension 30 mL  30 mL Oral Q4H PRN Ladonte Verstraete T, MD   30 mL at 04/26/22 6295   aspirin EC tablet 81 mg  81 mg Oral Daily Darean Rote, [name], MD   81 mg at 10/08/22 1201   atorvastatin (LIPITOR) tablet 20 mg  20 mg Oral Daily Ltanya Bayley T, MD   20 mg at 10/08/22 1202   atropine 1 % ophthalmic solution 1 drop  1 drop Sublingual QID [name], MD   1 drop at 10/08/22 1202   benztropine (COGENTIN) tablet 0.5 mg  0.5 mg Oral BID Kennis Buell T, MD   0.5 mg at 10/08/22 1202   clonazePAM (KLONOPIN) tablet 1 mg  1 mg Oral TID PRN He, [name], MD   1 mg at 09/28/22 2130   cloZAPine (CLOZARIL) tablet 300 mg  300 mg Oral QHS Torian Thoennes, [name], MD   300  mg at 10/07/22 2111   diphenhydrAMINE (BENADRYL) capsule 50 mg  50 mg Oral Q6H PRN Chyrel Taha, [name], MD   50 mg at 09/30/22 2119   Or   diphenhydrAMINE (BENADRYL) injection 50 mg  50 mg Intramuscular Q6H PRN Patti Shorb T, MD       gabapentin (NEURONTIN) capsule 300 mg  300 mg Oral TID Tajon Moring T, MD   300 mg at 10/08/22 1202   haloperidol (HALDOL) tablet 2 mg  2 mg Oral TID Levi Crass, [name], MD   2 mg at 10/08/22 1202   haloperidol (HALDOL) tablet 5 mg  5 mg Oral Q6H PRN Katalaya Beel T, MD   5 mg at 09/17/22 1532   Or   haloperidol lactate (HALDOL) injection 5 mg  5 mg Intramuscular Q6H PRN Alexys Lobello T, MD       lisinopril (ZESTRIL) tablet 5 mg  5 mg Oral Daily Zarielle Cea T, MD   5 mg at 10/08/22 1201   magnesium hydroxide (MILK OF MAGNESIA) suspension 30  mL  30 mL Oral Daily PRN Jayma Volpi T, MD   30 mL at 09/15/22 1047   metoprolol succinate (TOPROL-XL) 24 hr tablet 25 mg  25 mg Oral Daily Nikala Walsworth T, MD   25 mg at 10/08/22 1202   ondansetron (ZOFRAN) tablet 4 mg  4 mg Oral Q8H PRN Lerry Liner M, NP   4 mg at 09/06/22 2320   paliperidone (INVEGA SUSTENNA) injection 156 mg  156 mg Intramuscular Q28 days Lanett Lasorsa, [name], MD   156 mg at 09/13/22 4008   senna-docusate (Senokot-S) tablet 2 tablet  2 tablet Oral QHS PRN Sarina Ill, DO   2 tablet at 09/15/22 2110   temazepam (RESTORIL) capsule 15 mg  15 mg Oral QHS Darwin Rothlisberger T, MD   15 mg at 10/07/22 2111   ziprasidone (GEODON) injection 20 mg  20 mg Intramuscular Q12H PRN Nuel Dejaynes, [name], MD   20 mg at 07/08/22 1453    Lab Results: No results found for this or any previous visit (from the past 48 hour(s)).  Blood Alcohol level:  Lab Results  Component Value Date   ETH <10 07/20/2021    Metabolic Disorder Labs: Lab Results  Component Value Date   HGBA1C 5.5 04/10/2022   MPG 111.15 04/10/2022   No results found for: "PROLACTIN" Lab Results  Component Value Date   CHOL 167 11/20/2021   TRIG 107 11/20/2021   HDL 26 (L) 11/20/2021   CHOLHDL 6.4 11/20/2021   VLDL 21 11/20/2021   LDLCALC 120 (H) 11/20/2021    Physical Findings: AIMS: Facial and Oral Movements Muscles of Facial Expression: None, normal Lips and Perioral Area: None, normal Jaw: None, normal Tongue: None, normal,Extremity Movements Upper (arms, wrists, hands, fingers): None, normal Lower (legs, knees, ankles, toes): None, normal, Trunk Movements Neck, shoulders, hips: None, normal, Overall Severity Severity of abnormal movements (highest score from questions above): None, normal Incapacitation due to abnormal movements: None, normal Patient's awareness of abnormal movements (rate only patient's report): No Awareness, Dental Status Current problems with teeth and/or dentures?: No Does  patient usually wear dentures?: No  CIWA:    COWS:     Musculoskeletal: Strength & Muscle Tone: within normal limits Gait & Station: normal Patient leans: N/A  Psychiatric Specialty Exam:  Presentation  General Appearance:  Appropriate for Environment; Casual; Neat; Well Groomed  Eye Contact: Fair  Speech: Slow  Speech Volume: Decreased  Handedness: Left  Mood and Affect  Mood: Anxious  Affect: Congruent   Thought Process  Thought Processes: Goal Directed  Descriptions of Associations:Circumstantial  Orientation:Partial  Thought Content:Scattered  History of Schizophrenia/Schizoaffective disorder:Yes  Duration of Psychotic Symptoms:Greater than six months  Hallucinations:No data recorded Ideas of Reference:None  Suicidal Thoughts:No data recorded Homicidal Thoughts:No data recorded  Sensorium  Memory: Remote Good  Judgment: Fair  Insight: Fair   Art therapist  Concentration: Fair  Attention Span: Fair  Recall: [employer] of Knowledge: Fair  Language: Fair   Psychomotor Activity  Psychomotor Activity:No data recorded  Assets  Assets:No data recorded  Sleep  Sleep:No data recorded   Physical Exam: Physical Exam Vitals and nursing note reviewed.  Constitutional:      Appearance: Normal appearance.  HENT:     Head: Normocephalic and atraumatic.     Mouth/Throat:     Pharynx: Oropharynx is clear.  Eyes:     Pupils: Pupils are equal, round, and reactive to light.  Cardiovascular:     Rate and Rhythm: Normal rate and regular rhythm.  Pulmonary:     Effort: Pulmonary effort is normal.     Breath sounds: Normal breath sounds.  Abdominal:     General: Abdomen is flat.     Palpations: Abdomen is soft.  Musculoskeletal:        General: Normal range of motion.  Skin:    General: Skin is warm and dry.  Neurological:     General: No focal deficit present.     Mental Status: He is alert. Mental status is at  baseline.  Psychiatric:        Mood and Affect: Mood normal.        Thought Content: Thought content normal.   Review of Systems  Unable to perform ROS: Language   Blood pressure 114/81, pulse 90, temperature 98.2 F (36.8 C), temperature source Oral, resp. rate 18, height 5\' 8"  (1.727 m), weight 85.8 kg, SpO2 99 %. Body mass index is 28.76 kg/m.   Treatment Plan Summary: Medication management and Plan no change to medication management.  Hoping for discharge this week.  , MD 10/08/2022, 2:51 PM

## 2022-10-09 NOTE — Progress Notes (Signed)
Patient  has just eaten evening snack. He is in a pleasant mood.  Came to the nurses station wanting to get his QHS medications.  Medications given and received without incident. Had to be prompted to lower his voice, as he periodically singing loudly.  He is cooperative and compliant. He denies si hi avh.  Support and encouragement offered. Q 15 min checks in place.     C Butler-Nicholson, LPN

## 2022-10-09 NOTE — Progress Notes (Signed)
Patient pleasant and cooperative this morning.Patient was disrupting the group therapy this morning and was taken off from the group. Patient was grumpy and did not eat lunch. Patient had dinner and took PM medications. Support and encouragement given.

## 2022-10-09 NOTE — Progress Notes (Signed)
Recreation Therapy Notes     Date: 10/09/2022   Time: 10:40 am      Location: Craft room    Behavioral response: N/A   Intervention Topic: Relaxation    Discussion/Intervention: Patient refused to attend group.    Clinical Observations/Feedback:  Patient refused to attend group.    Aryanna Shaver LRT/CTRS        Alfreddie Consalvo 10/09/2022 11:50 AM

## 2022-10-09 NOTE — Plan of Care (Signed)
  Problem: Education: Goal: Ability to state activities that reduce stress will improve Outcome: Progressing   Problem: Coping: Goal: Ability to identify and develop effective coping behavior will improve Outcome: Progressing   Problem: Self-Concept: Goal: Ability to identify factors that promote anxiety will improve Outcome: Progressing Goal: Level of anxiety will decrease Outcome: Progressing Goal: Ability to modify response to factors that promote anxiety will improve Outcome: Progressing   Problem: Education: Goal: Knowledge of General Education information will improve Description: Including pain rating scale, medication(s)/side effects and non-pharmacologic comfort measures Outcome: Progressing   Problem: Education: Goal: Will be free of psychotic symptoms Outcome: Progressing   Problem: Coping: Goal: Coping ability will improve Outcome: Progressing   Problem: Activity: Goal: Interest or engagement in leisure activities will improve Outcome: Progressing   Problem: Skin Integrity: Goal: Risk for impaired skin integrity will decrease Outcome: Progressing   Problem: Safety: Goal: Ability to remain free from injury will improve Outcome: Progressing

## 2022-10-09 NOTE — Progress Notes (Signed)
[facility] MD Progress Note  10/09/2022 3:12 PM Adam [name]  MRN:  [id number] Subjective: Patient seen and chart reviewed.  No change in overall condition.  He was in a bit of a grumpy mood part of the day today but that happens from time to time.  He was not aggressive or threatening.  He was still taking care of all of his ADLs attending groups and not disruptive. 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 No pertinent surgical history. Family History: History reviewed. No pertinent family history. Family Psychiatric  History: See previous Social History:  Social History   Substance and Sexual Activity  Alcohol Use Not Currently     Social History   Substance and Sexual Activity  Drug Use Not Currently    Social History   Socioeconomic History   Marital status: Single    Spouse name: Not on file   Number of children: Not on file   Years of education: Not on file   Highest education level: Not on file  Occupational History   Not on file  Tobacco Use   Smoking status: Never    Passive exposure: Never   Smokeless tobacco: Never  Vaping Use   Vaping Use: Unknown  Substance and Sexual Activity   Alcohol use: Not Currently   Drug use: Not Currently   Sexual activity: Not Currently  Other Topics Concern   Not on file  Social History Narrative    Not on file   Social Determinants of Health   Financial Resource Strain: Not on file  Food Insecurity: Not on file  Transportation Needs: Not on file  Physical Activity: Not on file  Stress: Not on file  Social Connections: Not on file   Additional Social History:  Specify valuables returned:  (none)                      Sleep: Fair  Appetite:  Fair  Current Medications: Current Facility-Administered Medications  Medication Dose Route Frequency Provider Last Rate Last Admin   acetaminophen (TYLENOL) tablet 650 mg  650 mg Oral Q6H PRN Rick Warnick T, MD   650 mg at 09/09/22 1813   alum & mag hydroxide-simeth (MAALOX/MYLANTA) 200-200-20 MG/5ML suspension 30 mL  30 mL Oral Q4H PRN Antonya Leeder T, MD   30 mL at 04/26/22 9326   aspirin EC tablet 81 mg  81 mg Oral Daily Charnel Giles, [name], MD   81 mg at 10/09/22 0755   atorvastatin (LIPITOR) tablet 20 mg  20 mg Oral Daily Margareta Laureano T, MD   20 mg at 10/09/22 0755   atropine 1 % ophthalmic solution 1 drop  1 drop Sublingual QID [name], MD   1 drop at 10/09/22 1202   benztropine (COGENTIN) tablet 0.5 mg  0.5 mg Oral BID Azyriah Nevins T, MD   0.5 mg at 10/09/22 0800  clonazePAM (KLONOPIN) tablet 1 mg  1 mg Oral TID PRN He, [name], MD   1 mg at 09/28/22 2130   cloZAPine (CLOZARIL) tablet 300 mg  300 mg Oral QHS Mellie Buccellato T, MD   300 mg at 10/08/22 2059   diphenhydrAMINE (BENADRYL) capsule 50 mg  50 mg Oral Q6H PRN Mahlik Lenn T, MD   50 mg at 09/30/22 2119   Or   diphenhydrAMINE (BENADRYL) injection 50 mg  50 mg Intramuscular Q6H PRN Zerrick Hanssen T, MD       gabapentin (NEURONTIN) capsule 300 mg  300 mg Oral TID Azile Minardi T, MD   300 mg at 10/09/22 1011   haloperidol (HALDOL) tablet 2 mg  2 mg Oral TID Ninetta Adelstein T, MD   2 mg at 10/09/22 1011   haloperidol (HALDOL) tablet 5 mg  5 mg Oral Q6H PRN Alix Lahmann T, MD   5 mg at 09/17/22 1532   Or   haloperidol lactate (HALDOL) injection 5 mg  5 mg Intramuscular  Q6H PRN Sachiko Methot T, MD       lisinopril (ZESTRIL) tablet 5 mg  5 mg Oral Daily Telford Archambeau T, MD   5 mg at 10/09/22 0755   magnesium hydroxide (MILK OF MAGNESIA) suspension 30 mL  30 mL Oral Daily PRN Ranferi Clingan T, MD   30 mL at 09/15/22 1047   metoprolol succinate (TOPROL-XL) 24 hr tablet 25 mg  25 mg Oral Daily Kainoa Swoboda T, MD   25 mg at 10/09/22 0755   ondansetron (ZOFRAN) tablet 4 mg  4 mg Oral Q8H PRN Lerry Liner M, NP   4 mg at 09/06/22 2320   paliperidone (INVEGA SUSTENNA) injection 156 mg  156 mg Intramuscular Q28 days Levetta Bognar, [name], MD   156 mg at 09/13/22 4098   senna-docusate (Senokot-S) tablet 2 tablet  2 tablet Oral QHS PRN Sarina Ill, DO   2 tablet at 09/15/22 2110   temazepam (RESTORIL) capsule 15 mg  15 mg Oral QHS Alyssabeth Bruster T, MD   15 mg at 10/08/22 2058   ziprasidone (GEODON) injection 20 mg  20 mg Intramuscular Q12H PRN Madoline Bhatt, [name], MD   20 mg at 07/08/22 1453    Lab Results: No results found for this or any previous visit (from the past 48 hour(s)).  Blood Alcohol level:  Lab Results  Component Value Date   ETH <10 07/20/2021    Metabolic Disorder Labs: Lab Results  Component Value Date   HGBA1C 5.5 04/10/2022   MPG 111.15 04/10/2022   No results found for: "PROLACTIN" Lab Results  Component Value Date   CHOL 167 11/20/2021   TRIG 107 11/20/2021   HDL 26 (L) 11/20/2021   CHOLHDL 6.4 11/20/2021   VLDL 21 11/20/2021   LDLCALC 120 (H) 11/20/2021    Physical Findings: AIMS: Facial and Oral Movements Muscles of Facial Expression: None, normal Lips and Perioral Area: None, normal Jaw: None, normal Tongue: None, normal,Extremity Movements Upper (arms, wrists, hands, fingers): None, normal Lower (legs, knees, ankles, toes): None, normal, Trunk Movements Neck, shoulders, hips: None, normal, Overall Severity Severity of abnormal movements (highest score from questions above): None, normal Incapacitation due to abnormal  movements: None, normal Patient's awareness of abnormal movements (rate only patient's report): No Awareness, Dental Status Current problems with teeth and/or dentures?: No Does patient usually wear dentures?: No  CIWA:    COWS:     Musculoskeletal: Strength & Muscle Tone: within normal  limits Gait & Station: normal Patient leans: N/A  Psychiatric Specialty Exam:  Presentation  General Appearance:  Appropriate for Environment; Casual; Neat; Well Groomed  Eye Contact: Fair  Speech: Slow  Speech Volume: Decreased  Handedness: Left   Mood and Affect  Mood: Anxious  Affect: Congruent   Thought Process  Thought Processes: Goal Directed  Descriptions of Associations:Circumstantial  Orientation:Partial  Thought Content:Scattered  History of Schizophrenia/Schizoaffective disorder:Yes  Duration of Psychotic Symptoms:Greater than six months  Hallucinations:No data recorded Ideas of Reference:None  Suicidal Thoughts:No data recorded Homicidal Thoughts:No data recorded  Sensorium  Memory: Remote Good  Judgment: Fair  Insight: Fair   Art therapist  Concentration: Fair  Attention Span: Fair  Recall: [employer] of Knowledge: Fair  Language: Fair   Psychomotor Activity  Psychomotor Activity:No data recorded  Assets  Assets:No data recorded  Sleep  Sleep:No data recorded   Physical Exam: Physical Exam Vitals and nursing note reviewed.  Constitutional:      Appearance: Normal appearance.  HENT:     Head: Normocephalic and atraumatic.     Mouth/Throat:     Pharynx: Oropharynx is clear.  Eyes:     Pupils: Pupils are equal, round, and reactive to light.  Cardiovascular:     Rate and Rhythm: Normal rate and regular rhythm.  Pulmonary:     Effort: Pulmonary effort is normal.     Breath sounds: Normal breath sounds.  Abdominal:     General: Abdomen is flat.     Palpations: Abdomen is soft.  Musculoskeletal:         General: Normal range of motion.  Skin:    General: Skin is warm and dry.  Neurological:     General: No focal deficit present.     Mental Status: He is alert. Mental status is at baseline.  Psychiatric:        Attention and Perception: He is inattentive.        Mood and Affect: Mood normal. Affect is blunt.        Speech: He is noncommunicative.    Review of Systems  Unable to perform ROS: Language   Blood pressure 118/81, pulse 86, temperature 97.9 F (36.6 C), temperature source Oral, resp. rate 18, height 5\' 8"  (1.727 m), weight 85.8 kg, SpO2 100 %. Body mass index is 28.76 kg/m.   Treatment Plan Summary: Plan no change to overall treatment plan.  We continue to wait for the system to come through with the financing so that we can get him discharged.  , MD 10/09/2022, 3:12 PM

## 2022-10-10 NOTE — Plan of Care (Signed)
  Problem: Education: Goal: Ability to state activities that reduce stress will improve Outcome: Progressing   Problem: Coping: Goal: Ability to identify and develop effective coping behavior will improve Outcome: Progressing   Problem: Self-Concept: Goal: Ability to identify factors that promote anxiety will improve Outcome: Progressing Goal: Level of anxiety will decrease Outcome: Progressing Goal: Ability to modify response to factors that promote anxiety will improve Outcome: Progressing   Problem: Education: Goal: Will be free of psychotic symptoms Outcome: Progressing   Problem: Coping: Goal: Coping ability will improve Outcome: Progressing   Problem: Activity: Goal: Interest or engagement in leisure activities will improve Outcome: Progressing   Problem: Skin Integrity: Goal: Risk for impaired skin integrity will decrease Outcome: Progressing

## 2022-10-10 NOTE — Progress Notes (Signed)
[facility] MD Progress Note  10/10/2022 2:49 PM Adam [name]  MRN:  [id number] Subjective: No new complaints.  No changes to behavior.  Completely stable. 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  Apparently funding and paperwork are still not in place.  Past history of schizophrenia  Past Medical History:  Past Medical History:  Diagnosis Date   Myocardial infarction ([place])    Schizophrenia ([place])    Stroke [facility])    History reviewed. No pertinent surgical history. Family History: History reviewed. No pertinent family history. Family Psychiatric  History: See previous Social History:  Social History   Substance and Sexual Activity  Alcohol Use Not Currently     Social History   Substance and Sexual Activity  Drug Use Not Currently    Social History   Socioeconomic History   Marital status: Single    Spouse name: Not on file   Number of children: Not on file   Years of education: Not on file   Highest education level: Not on file  Occupational History   Not on file  Tobacco Use   Smoking status: Never    Passive exposure: Never   Smokeless tobacco: Never  Vaping Use   Vaping Use: Unknown  Substance and Sexual Activity   Alcohol use: Not Currently   Drug use: Not Currently   Sexual activity: Not Currently  Other Topics Concern   Not on file  Social History Narrative   Not on file   Social Determinants of Health   Financial  Resource Strain: Not on file  Food Insecurity: Not on file  Transportation Needs: Not on file  Physical Activity: Not on file  Stress: Not on file  Social Connections: Not on file   Additional Social History:  Specify valuables returned:  (none)                      Sleep: Fair  Appetite:  Fair  Current Medications: Current Facility-Administered Medications  Medication Dose Route Frequency Provider Last Rate Last Admin   acetaminophen (TYLENOL) tablet 650 mg  650 mg Oral Q6H PRN Niclas Markell T, MD   650 mg at 09/09/22 1813   alum & mag hydroxide-simeth (MAALOX/MYLANTA) 200-200-20 MG/5ML suspension 30 mL  30 mL Oral Q4H PRN Vara Mairena T, MD   30 mL at 04/26/22 [id number]   aspirin EC tablet 81 mg  81 mg Oral Daily Airlie Blumenberg, [name], MD   81 mg at 10/10/22 0854   atorvastatin (LIPITOR) tablet 20 mg  20 mg Oral Daily Mitali Shenefield T, MD   20 mg at 10/10/22 0854   atropine 1 % ophthalmic solution 1 drop  1 drop Sublingual QID [name], MD   1 drop at 10/10/22 1149   benztropine (COGENTIN) tablet 0.5 mg  0.5 mg Oral BID Tinie Mcgloin, [name], MD   0.5 mg at 10/10/22 0854   clonazePAM (KLONOPIN) tablet 1 mg  1 mg Oral TID PRN He, [name], MD  1 mg at 09/28/22 2130   cloZAPine (CLOZARIL) tablet 300 mg  300 mg Oral QHS Karson Chicas T, MD   300 mg at 10/09/22 2044   diphenhydrAMINE (BENADRYL) capsule 50 mg  50 mg Oral Q6H PRN Xayden Linsey, [name], MD   50 mg at 09/30/22 2119   Or   diphenhydrAMINE (BENADRYL) injection 50 mg  50 mg Intramuscular Q6H PRN Londyn Hotard T, MD       gabapentin (NEURONTIN) capsule 300 mg  300 mg Oral TID Johnston Maddocks, [name], MD   300 mg at 10/10/22 0855   haloperidol (HALDOL) tablet 2 mg  2 mg Oral TID Jamir Rone, [name], MD   2 mg at 10/10/22 0854   haloperidol (HALDOL) tablet 5 mg  5 mg Oral Q6H PRN Zeinab Rodwell T, MD   5 mg at 09/17/22 1532   Or   haloperidol lactate (HALDOL) injection 5 mg  5 mg Intramuscular Q6H PRN Meliza Kage T, MD       lisinopril (ZESTRIL) tablet  5 mg  5 mg Oral Daily Brookley Spitler T, MD   5 mg at 10/10/22 0854   magnesium hydroxide (MILK OF MAGNESIA) suspension 30 mL  30 mL Oral Daily PRN Lazarus Sudbury T, MD   30 mL at 09/15/22 1047   metoprolol succinate (TOPROL-XL) 24 hr tablet 25 mg  25 mg Oral Daily Aisa Schoeppner T, MD   25 mg at 10/10/22 0854   ondansetron (ZOFRAN) tablet 4 mg  4 mg Oral Q8H PRN Lerry Liner M, NP   4 mg at 09/06/22 2320   paliperidone (INVEGA SUSTENNA) injection 156 mg  156 mg Intramuscular Q28 days Dameshia Seybold, [name], MD   156 mg at 09/13/22 1914   senna-docusate (Senokot-S) tablet 2 tablet  2 tablet Oral QHS PRN Sarina Ill, DO   2 tablet at 09/15/22 2110   temazepam (RESTORIL) capsule 15 mg  15 mg Oral QHS Shontel Santee T, MD   15 mg at 10/09/22 2044   ziprasidone (GEODON) injection 20 mg  20 mg Intramuscular Q12H PRN Emika Tiano, [name], MD   20 mg at 07/08/22 1453    Lab Results: No results found for this or any previous visit (from the past 48 hour(s)).  Blood Alcohol level:  Lab Results  Component Value Date   ETH <10 07/20/2021    Metabolic Disorder Labs: Lab Results  Component Value Date   HGBA1C 5.5 04/10/2022   MPG 111.15 04/10/2022   No results found for: "PROLACTIN" Lab Results  Component Value Date   CHOL 167 11/20/2021   TRIG 107 11/20/2021   HDL 26 (L) 11/20/2021   CHOLHDL 6.4 11/20/2021   VLDL 21 11/20/2021   LDLCALC 120 (H) 11/20/2021    Physical Findings: AIMS: Facial and Oral Movements Muscles of Facial Expression: None, normal Lips and Perioral Area: None, normal Jaw: None, normal Tongue: None, normal,Extremity Movements Upper (arms, wrists, hands, fingers): None, normal Lower (legs, knees, ankles, toes): None, normal, Trunk Movements Neck, shoulders, hips: None, normal, Overall Severity Severity of abnormal movements (highest score from questions above): None, normal Incapacitation due to abnormal movements: None, normal Patient's awareness of abnormal  movements (rate only patient's report): No Awareness, Dental Status Current problems with teeth and/or dentures?: No Does patient usually wear dentures?: No  CIWA:    COWS:     Musculoskeletal: Strength & Muscle Tone: within normal limits Gait & Station: normal Patient leans: N/A  Psychiatric Specialty Exam:  Presentation  General  Appearance:  Appropriate for Environment; Casual; Neat; Well Groomed  Eye Contact: Fair  Speech: Slow  Speech Volume: Decreased  Handedness: Left   Mood and Affect  Mood: Anxious  Affect: Congruent   Thought Process  Thought Processes: Goal Directed  Descriptions of Associations:Circumstantial  Orientation:Partial  Thought Content:Scattered  History of Schizophrenia/Schizoaffective disorder:Yes  Duration of Psychotic Symptoms:Greater than six months  Hallucinations:No data recorded Ideas of Reference:None  Suicidal Thoughts:No data recorded Homicidal Thoughts:No data recorded  Sensorium  Memory: Remote Good  Judgment: Fair  Insight: Fair   Community education officer  Concentration: Fair  Attention Span: Fair  Recall: [employer] of Knowledge: Fair  Language: Fair   Psychomotor Activity  Psychomotor Activity:No data recorded  Assets  Assets:No data recorded  Sleep  Sleep:No data recorded   Physical Exam: Physical Exam Vitals and nursing note reviewed.  Constitutional:      Appearance: Normal appearance.  HENT:     Head: Normocephalic and atraumatic.     Mouth/Throat:     Pharynx: Oropharynx is clear.  Eyes:     Pupils: Pupils are equal, round, and reactive to light.  Cardiovascular:     Rate and Rhythm: Normal rate and regular rhythm.  Pulmonary:     Effort: Pulmonary effort is normal.     Breath sounds: Normal breath sounds.  Abdominal:     General: Abdomen is flat.     Palpations: Abdomen is soft.  Musculoskeletal:        General: Normal range of motion.  Skin:    General: Skin is  warm and dry.  Neurological:     General: No focal deficit present.     Mental Status: He is alert. Mental status is at baseline.  Psychiatric:        Mood and Affect: Mood normal.        Thought Content: Thought content normal.    Review of Systems  Unable to perform ROS: Language   Blood pressure (!) 114/95, pulse 96, temperature 97.7 F (36.5 C), temperature source Oral, resp. rate 18, height 5\' 8"  (1.727 m), weight 85.8 kg, SpO2 100 %. Body mass index is 28.76 kg/m.   Treatment Plan Summary: Plan absolutely stable.  No change to anything.  Very much anticipating discharge as soon as possible  Alethia Berthold, MD 10/10/2022, 2:49 PM

## 2022-10-10 NOTE — Plan of Care (Signed)
Patient appropriate with staff & peers. No aggressive behaviors noted. Compliant with medications. No distress noted. Appetite and energy level good. Support and encouragement given.

## 2022-10-10 NOTE — Progress Notes (Signed)
Recreation Therapy Notes  Date: 10/10/2022  Time: 10:50am    Location: Craft room   Behavioral response: Appropriate  Intervention Topic:  Problem-Solving    Discussion/Intervention:  Group content on today was focused on problem solving. The group described what problem solving is. Patients expressed how problems affect them and how they deal with problems. Individuals identified healthy ways to deal with problems. Patients explained what normally happens to them when they do not deal with problems. The group expressed reoccurring problems for them. The group participated in the intervention "Ways to Solve problems" where patients were given a chance to explore different ways to solve problems.  .  Clinical Observations/Feedback: Patient came to group and was focused on what peers and staff had to say about problem solving. Individual was social with peers and staff while participating in the intervention.    Romain Erion LRT/CTRS         Katriel Cutsforth 10/10/2022 12:21 PM

## 2022-10-10 NOTE — Group Note (Signed)
[facility] LCSW Group Therapy Note   Group Date: 10/10/2022 Start Time: 1300 End Time: 1400   Type of Therapy/Topic:  Group Therapy:  Emotion Regulation  Participation Level:  Minimal    Description of Group:    The purpose of this group is to assist patients in learning to regulate negative emotions and experience positive emotions. Patients will be guided to discuss ways in which they have been vulnerable to their negative emotions. These vulnerabilities will be juxtaposed with experiences of positive emotions or situations, and patients challenged to use positive emotions to combat negative ones. Special emphasis will be placed on coping with negative emotions in conflict situations, and patients will process healthy conflict resolution skills.  Therapeutic Goals: Patient will identify two positive emotions or experiences to reflect on in order to balance out negative emotions:  Patient will label two or more emotions that they find the most difficult to experience:  Patient will be able to demonstrate positive conflict resolution skills through discussion or role plays:   Summary of Patient Progress: Patient was present for the entirety of the group process. He unable to participate in the discussion. But was able to say his name and tell us his favorite season (Winter), albeit in the middle of the group, long after the icebreaker had been finished.    Therapeutic Modalities:   Cognitive Behavioral Therapy Feelings Identification Dialectical Behavioral Therapy   Glenis Smoker, LCSW

## 2022-10-10 NOTE — [facility] Counselor (Signed)
CSW team attended meeting with LME staff and [facility] management regarding pt case. Adam [name] gave an update regarding contact with group home and trying to get paperwork completed for services. She shared that once this is received it will be a "pretty speedy" process. During meeting CSW reached out to group home regarding paperwork and was informed that they had some questions about the paperwork which kept them from being able to complete it. Adam [name] was updated regarding this and CSW confirmed that she had the correct information to call back. No other concerns expressed. Contact ended without incident.   Vilma Meckel. Algis Greenhouse, MSW, LCSW, LCAS 10/10/2022 3:21 PM

## 2022-10-11 LAB — CBC WITH DIFFERENTIAL/PLATELET
Abs Immature Granulocytes: 0.01 10*3/uL (ref 0.00–0.07)
Basophils Absolute: 0.1 10*3/uL (ref 0.0–0.1)
Basophils Relative: 2 %
Eosinophils Absolute: 0 10*3/uL (ref 0.0–0.5)
Eosinophils Relative: 0 %
HCT: 36.8 % — ABNORMAL LOW (ref 39.0–52.0)
Hemoglobin: 13 g/dL (ref 13.0–17.0)
Immature Granulocytes: 0 %
Lymphocytes Relative: 25 %
Lymphs Abs: 1.1 10*3/uL (ref 0.7–4.0)
MCH: 31.5 pg (ref 26.0–34.0)
MCHC: 35.3 g/dL (ref 30.0–36.0)
MCV: 89.1 fL (ref 80.0–100.0)
Monocytes Absolute: 0.5 10*3/uL (ref 0.1–1.0)
Monocytes Relative: 12 %
Neutro Abs: 2.7 10*3/uL (ref 1.7–7.7)
Neutrophils Relative %: 61 %
Platelets: 153 10*3/uL (ref 150–400)
RBC: 4.13 MIL/uL — ABNORMAL LOW (ref 4.22–5.81)
RDW: 13.7 % (ref 11.5–15.5)
WBC: 4.4 10*3/uL (ref 4.0–10.5)
nRBC: 0 % (ref 0.0–0.2)

## 2022-10-11 NOTE — Plan of Care (Signed)
D: Patient alert and oriented. Patient denies pain. Patient denies anxiety and depression. Patient denies SI/HI/AVH. Patient refused scheduled invega sustenna injection.   A: Scheduled medications administered to patient, per MD orders.  Support and encouragement provided to patient.  Q15 minute safety checks maintained.   R: Patient compliant with medication administration and treatment plan. No adverse drug reactions noted. Patient remains safe on the unit at this time. Problem: Education: Goal: Knowledge of General Education information will improve Description: Including pain rating scale, medication(s)/side effects and non-pharmacologic comfort measures Outcome: Progressing   Problem: Clinical Measurements: Goal: Ability to maintain clinical measurements within normal limits will improve Outcome: Progressing Goal: Will remain free from infection Outcome: Progressing   Problem: Activity: Goal: Risk for activity intolerance will decrease Outcome: Progressing   Problem: Nutrition: Goal: Adequate nutrition will be maintained Outcome: Progressing

## 2022-10-11 NOTE — Progress Notes (Signed)
[facility] MD Progress Note  10/11/2022 5:11 PM Adam [name]  MRN:  [id number] Subjective: Patient seen and chart reviewed.  Patient has no new complaints.  Very vocal today but not aggressive or violent. 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 No pertinent surgical history. Family History: History reviewed. No pertinent family history. Family Psychiatric  History: See previous Social History:  Social History   Substance and Sexual Activity  Alcohol Use Not Currently     Social History   Substance and Sexual Activity  Drug Use Not Currently    Social History   Socioeconomic History   Marital status: Single    Spouse name: Not on file   Number of children: Not on file   Years of education: Not on file   Highest education level: Not on file  Occupational History   Not on file  Tobacco Use   Smoking status: Never    Passive exposure: Never   Smokeless tobacco: Never  Vaping Use   Vaping Use: Unknown  Substance and Sexual Activity   Alcohol use: Not Currently   Drug use: Not Currently   Sexual activity: Not Currently  Other Topics Concern   Not on file  Social History Narrative   Not on file   Social Determinants of Health   Financial Resource Strain: Not on file  Food Insecurity: Not on file  Transportation Needs: Not on file   Physical Activity: Not on file  Stress: Not on file  Social Connections: Not on file   Additional Social History:  Specify valuables returned:  (none)                      Sleep: Fair  Appetite:  Fair  Current Medications: Current Facility-Administered Medications  Medication Dose Route Frequency Provider Last Rate Last Admin   acetaminophen (TYLENOL) tablet 650 mg  650 mg Oral Q6H PRN Toshi Ishii T, MD   650 mg at 09/09/22 1813   alum & mag hydroxide-simeth (MAALOX/MYLANTA) 200-200-20 MG/5ML suspension 30 mL  30 mL Oral Q4H PRN Nesiah Jump T, MD   30 mL at 04/26/22 0623   aspirin EC tablet 81 mg  81 mg Oral Daily Elpidio Thielen, [name], MD   81 mg at 10/11/22 0902   atorvastatin (LIPITOR) tablet 20 mg  20 mg Oral Daily Markesha Hannig T, MD   20 mg at 10/11/22 0901   atropine 1 % ophthalmic solution 1 drop  1 drop Sublingual QID [name], MD   1 drop at 10/11/22 1635   benztropine (COGENTIN) tablet 0.5 mg  0.5 mg Oral BID Masiah Lewing T, MD   0.5 mg at 10/11/22 1629   clonazePAM (KLONOPIN) tablet 1 mg  1 mg Oral TID PRN He, [name], MD   1 mg at 09/28/22 2130   cloZAPine (CLOZARIL) tablet 300 mg  300 mg Oral QHS Sonjia Wilcoxson T,  MD   300 mg at 10/10/22 2117   diphenhydrAMINE (BENADRYL) capsule 50 mg  50 mg Oral Q6H PRN Zaryan Yakubov, [name], MD   50 mg at 09/30/22 2119   Or   diphenhydrAMINE (BENADRYL) injection 50 mg  50 mg Intramuscular Q6H PRN Bradley Handyside, [name], MD       gabapentin (NEURONTIN) capsule 300 mg  300 mg Oral TID Xyler Terpening, [name], MD   300 mg at 10/11/22 1629   haloperidol (HALDOL) tablet 2 mg  2 mg Oral TID Nikkie Liming, [name], MD   2 mg at 10/11/22 1629   haloperidol (HALDOL) tablet 5 mg  5 mg Oral Q6H PRN Davene Jobin, [name], MD   5 mg at 09/17/22 1532   Or   haloperidol lactate (HALDOL) injection 5 mg  5 mg Intramuscular Q6H PRN Kellyn Mccary, [name], MD       lisinopril (ZESTRIL) tablet 5 mg  5 mg Oral Daily Mande Auvil T, MD   5 mg at 10/11/22 0902   magnesium hydroxide (MILK  OF MAGNESIA) suspension 30 mL  30 mL Oral Daily PRN Giani Betzold T, MD   30 mL at 09/15/22 1047   metoprolol succinate (TOPROL-XL) 24 hr tablet 25 mg  25 mg Oral Daily Devonda Pequignot T, MD   25 mg at 10/11/22 0902   ondansetron (ZOFRAN) tablet 4 mg  4 mg Oral Q8H PRN Jearld Lesch, NP   4 mg at 09/06/22 2320   paliperidone (INVEGA SUSTENNA) injection 156 mg  156 mg Intramuscular Q28 days Tonnie Friedel, [name], MD   156 mg at 09/13/22 7867   senna-docusate (Senokot-S) tablet 2 tablet  2 tablet Oral QHS PRN Sarina Ill, DO   2 tablet at 09/15/22 2110   temazepam (RESTORIL) capsule 15 mg  15 mg Oral QHS Emmaleigh Longo T, MD   15 mg at 10/10/22 2117   ziprasidone (GEODON) injection 20 mg  20 mg Intramuscular Q12H PRN Mavis Fichera, [name], MD   20 mg at 07/08/22 1453    Lab Results:  Results for orders placed or performed during the hospital encounter of 03/19/22 (from the past 48 hour(s))  CBC with Differential/Platelet     Status: Abnormal   Collection Time: 10/11/22  7:11 AM  Result Value Ref Range   WBC 4.4 4.0 - 10.5 K/uL   RBC 4.13 (L) 4.22 - 5.81 MIL/uL   Hemoglobin 13.0 13.0 - 17.0 g/dL   HCT 67.2 (L) 09.4 - 70.9 %   MCV 89.1 80.0 - 100.0 fL   MCH 31.5 26.0 - 34.0 pg   MCHC 35.3 30.0 - 36.0 g/dL   RDW 62.8 36.6 - 29.4 %   Platelets 153 150 - 400 K/uL   nRBC 0.0 0.0 - 0.2 %   Neutrophils Relative % 61 %   Neutro Abs 2.7 1.7 - 7.7 K/uL   Lymphocytes Relative 25 %   Lymphs Abs 1.1 0.7 - 4.0 K/uL   Monocytes Relative 12 %   Monocytes Absolute 0.5 0.1 - 1.0 K/uL   Eosinophils Relative 0 %   Eosinophils Absolute 0.0 0.0 - 0.5 K/uL   Basophils Relative 2 %   Basophils Absolute 0.1 0.0 - 0.1 K/uL   Immature Granulocytes 0 %   Abs Immature Granulocytes 0.01 0.00 - 0.07 K/uL    Comment: Performed at [facility], [address] [place], [place] [address]    Blood Alcohol level:  Lab Results  Component Value Date   ETH <  10 07/20/2021    Metabolic Disorder Labs: Lab  Results  Component Value Date   HGBA1C 5.5 04/10/2022   MPG 111.15 04/10/2022   No results found for: "PROLACTIN" Lab Results  Component Value Date   CHOL 167 11/20/2021   TRIG 107 11/20/2021   HDL 26 (L) 11/20/2021   CHOLHDL 6.4 11/20/2021   VLDL 21 11/20/2021   LDLCALC 120 (H) 11/20/2021    Physical Findings: AIMS: Facial and Oral Movements Muscles of Facial Expression: None, normal Lips and Perioral Area: None, normal Jaw: None, normal Tongue: None, normal,Extremity Movements Upper (arms, wrists, hands, fingers): None, normal Lower (legs, knees, ankles, toes): None, normal, Trunk Movements Neck, shoulders, hips: None, normal, Overall Severity Severity of abnormal movements (highest score from questions above): None, normal Incapacitation due to abnormal movements: None, normal Patient's awareness of abnormal movements (rate only patient's report): No Awareness, Dental Status Current problems with teeth and/or dentures?: No Does patient usually wear dentures?: No  CIWA:    COWS:     Musculoskeletal: Strength & Muscle Tone: within normal limits Gait & Station: normal Patient leans: N/A  Psychiatric Specialty Exam:  Presentation  General Appearance:  Appropriate for Environment; Casual; Neat; Well Groomed  Eye Contact: Fair  Speech: Slow  Speech Volume: Decreased  Handedness: Left   Mood and Affect  Mood: Anxious  Affect: Congruent   Thought Process  Thought Processes: Goal Directed  Descriptions of Associations:Circumstantial  Orientation:Partial  Thought Content:Scattered  History of Schizophrenia/Schizoaffective disorder:Yes  Duration of Psychotic Symptoms:Greater than six months  Hallucinations:No data recorded Ideas of Reference:None  Suicidal Thoughts:No data recorded Homicidal Thoughts:No data recorded  Sensorium  Memory: Remote Good  Judgment: Fair  Insight: Fair   Art therapist   Concentration: Fair  Attention Span: Fair  Recall: [employer] of Knowledge: Fair  Language: Fair   Psychomotor Activity  Psychomotor Activity:No data recorded  Assets  Assets:No data recorded  Sleep  Sleep:No data recorded   Physical Exam: Physical Exam Vitals and nursing note reviewed.  Constitutional:      Appearance: Normal appearance.  HENT:     Head: Normocephalic and atraumatic.     Mouth/Throat:     Pharynx: Oropharynx is clear.  Eyes:     Pupils: Pupils are equal, round, and reactive to light.  Cardiovascular:     Rate and Rhythm: Normal rate and regular rhythm.  Pulmonary:     Effort: Pulmonary effort is normal.     Breath sounds: Normal breath sounds.  Abdominal:     General: Abdomen is flat.     Palpations: Abdomen is soft.  Musculoskeletal:        General: Normal range of motion.  Skin:    General: Skin is warm and dry.  Neurological:     General: No focal deficit present.     Mental Status: He is alert. Mental status is at baseline.  Psychiatric:        Mood and Affect: Affect is blunt.    Review of Systems  Constitutional: Negative.   HENT: Negative.    Eyes: Negative.   Respiratory: Negative.    Cardiovascular: Negative.   Gastrointestinal: Negative.   Musculoskeletal: Negative.   Skin: Negative.   Neurological: Negative.   Psychiatric/Behavioral:  Negative for depression.    Blood pressure 112/80, pulse 83, temperature 97.9 F (36.6 C), temperature source Oral, resp. rate 18, height 5\' 8"  (1.727 m), weight 85.8 kg, SpO2 99 %. Body mass index is 28.76 kg/m.  Treatment Plan Summary: Plan completely stable over a year in the hospital.  Only waiting for discharge.  Patient refused his Invega shot today.  Usually if he refuses something he will go ahead and allow it within the next day or so.  We will try again tomorrow.  Mordecai Rasmussen, MD 10/11/2022, 5:11 PM

## 2022-10-11 NOTE — Progress Notes (Signed)
Has been active on the unit. Pacing the unit going back and forth between his room and the milieu.  He is animated.  No issues on the unit.  Denies si hi avh .  Pleasant and cooperative and easily redirectable.    C Butler-Nicholson, LPN

## 2022-10-12 MED ORDER — PALIPERIDONE ER 3 MG PO TB24
6.0000 mg | ORAL_TABLET | Freq: Every day | ORAL | Status: DC
  Administered 2022-10-12 – 2023-02-18 (×127): 6 mg via ORAL
  Filled 2022-10-12 (×129): qty 2

## 2022-10-12 NOTE — Progress Notes (Signed)
Yandell is up at the nurses station.  Visibly irritated. Wanting Tylenol. Not feeling well. Coughing and flush. Tylenol and Gatorade provided. Will continue to monitor.  Provided mask for infection control.   C Butler-Nicholson, LPN

## 2022-10-12 NOTE — Progress Notes (Signed)
Recreation Therapy Notes  Date: 10/12/2022   Time: 10:30 am      Location: Craft room    Behavioral response: N/A   Intervention Topic: Values    Discussion/Intervention: Patient refused to attend group.    Clinical Observations/Feedback:  Patient refused to attend group.    Leshia Kope LRT/CTRS          Damani Rando 10/12/2022 10:43 AM 

## 2022-10-12 NOTE — [facility] Group Notes (Signed)
[facility] Group Notes:  (Nursing/MHT/Case Management/Adjunct)  Date:  10/12/2022  Time:  8:16 PM  Type of Therapy:   Wrap up  Participation Level:  Active  Participation Quality:  Appropriate  Affect:  Appropriate  Cognitive:  Alert  Insight:  Good  Engagement in Group:  Engaged and Couldn't what he said.  Modes of Intervention:  Support  Summary of Progress/Problems:  Adam [name] 10/12/2022, 8:16 PM

## 2022-10-12 NOTE — Progress Notes (Signed)
[facility] MD Progress Note  10/12/2022 11:28 AM Adam [name]  MRN:  [id number] Subjective: Patient seen for follow-up.  He has refused his Invega long-acting injectable shot which has been due for a couple of days.  I tried to talk with her about him this morning.  As soon as I mentioned it he started shaking his head and saying no,no.no.  Of course it is always difficult to know exactly what he is thinking but he seems to be making it clear that he will not take the shot while at the same time he remains fully compliant with his oral medicine.  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 No pertinent surgical history. Family History: History reviewed. No pertinent family history. Family Psychiatric  History: See previous Social History:  Social History   Substance and Sexual Activity  Alcohol Use Not Currently     Social History   Substance and Sexual Activity  Drug Use Not Currently    Social History   Socioeconomic History   Marital status: Single    Spouse name: Not on file   Number of children: Not on file   Years of education: Not on file   Highest education level: Not on file  Occupational History   Not on file  Tobacco Use   Smoking status: Never    Passive exposure: Never   Smokeless  tobacco: Never  Vaping Use   Vaping Use: Unknown  Substance and Sexual Activity   Alcohol use: Not Currently   Drug use: Not Currently   Sexual activity: Not Currently  Other Topics Concern   Not on file  Social History Narrative   Not on file   Social Determinants of Health   Financial Resource Strain: Not on file  Food Insecurity: Not on file  Transportation Needs: Not on file  Physical Activity: Not on file  Stress: Not on file  Social Connections: Not on file   Additional Social History:  Specify valuables returned:  (none)                      Sleep: Fair  Appetite:  Fair  Current Medications: Current Facility-Administered Medications  Medication Dose Route Frequency Provider Last Rate Last Admin   acetaminophen (TYLENOL) tablet 650 mg  650 mg Oral Q6H PRN Ande Therrell T, MD   650 mg at 10/12/22 0531   alum & mag hydroxide-simeth (MAALOX/MYLANTA) 200-200-20 MG/5ML suspension 30 mL  30 mL Oral Q4H PRN Daeshaun Specht T, MD   30 mL at 04/26/22 8891   aspirin EC tablet 81 mg  81 mg Oral Daily Anaiz Qazi, [name], MD   81 mg at 10/12/22 0859   atorvastatin (LIPITOR) tablet 20 mg  20 mg Oral Daily Koleton Duchemin T, MD   20 mg at 10/12/22 0859   atropine 1 % ophthalmic  solution 1 drop  1 drop Sublingual QID [name], MD   1 drop at 10/12/22 0859   benztropine (COGENTIN) tablet 0.5 mg  0.5 mg Oral BID Mickey Hebel, [name], MD   0.5 mg at 10/12/22 0859   clonazePAM (KLONOPIN) tablet 1 mg  1 mg Oral TID PRN He, [name], MD   1 mg at 09/28/22 2130   cloZAPine (CLOZARIL) tablet 300 mg  300 mg Oral QHS Britain Saber, [name], MD   300 mg at 10/11/22 2113   diphenhydrAMINE (BENADRYL) capsule 50 mg  50 mg Oral Q6H PRN Demian Maisel, [name], MD   50 mg at 09/30/22 2119   Or   diphenhydrAMINE (BENADRYL) injection 50 mg  50 mg Intramuscular Q6H PRN Mazel Villela, [name], MD       gabapentin (NEURONTIN) capsule 300 mg  300 mg Oral TID Taheerah Guldin T, MD   300 mg at 10/12/22 0900   haloperidol (HALDOL)  tablet 2 mg  2 mg Oral TID Daira Hine, [name], MD   2 mg at 10/12/22 0900   haloperidol (HALDOL) tablet 5 mg  5 mg Oral Q6H PRN Mittie Knittel, [name], MD   5 mg at 09/17/22 1532   Or   haloperidol lactate (HALDOL) injection 5 mg  5 mg Intramuscular Q6H PRN Makelle Marrone, [name], MD       lisinopril (ZESTRIL) tablet 5 mg  5 mg Oral Daily Arliss Frisina, [name], MD   5 mg at 10/12/22 0859   magnesium hydroxide (MILK OF MAGNESIA) suspension 30 mL  30 mL Oral Daily PRN Chesley Valls, [name], MD   30 mL at 09/15/22 1047   metoprolol succinate (TOPROL-XL) 24 hr tablet 25 mg  25 mg Oral Daily Kerin Cecchi, [name], MD   25 mg at 10/12/22 0859   ondansetron (ZOFRAN) tablet 4 mg  4 mg Oral Q8H PRN Jearld Lesch, NP   4 mg at 09/06/22 2320   paliperidone (INVEGA) 24 hr tablet 6 mg  6 mg Oral QHS Roberto Romanoski, [name], MD       senna-docusate (Senokot-S) tablet 2 tablet  2 tablet Oral QHS PRN Sarina Ill, DO   2 tablet at 09/15/22 2110   temazepam (RESTORIL) capsule 15 mg  15 mg Oral QHS Racine Erby T, MD   15 mg at 10/11/22 2113   ziprasidone (GEODON) injection 20 mg  20 mg Intramuscular Q12H PRN Evalisse Prajapati, [name], MD   20 mg at 07/08/22 1453    Lab Results:  Results for orders placed or performed during the hospital encounter of 03/19/22 (from the past 48 hour(s))  CBC with Differential/Platelet     Status: Abnormal   Collection Time: 10/11/22  7:11 AM  Result Value Ref Range   WBC 4.4 4.0 - 10.5 K/uL   RBC 4.13 (L) 4.22 - 5.81 MIL/uL   Hemoglobin 13.0 13.0 - 17.0 g/dL   HCT 96.0 (L) 45.4 - 09.8 %   MCV 89.1 80.0 - 100.0 fL   MCH 31.5 26.0 - 34.0 pg   MCHC 35.3 30.0 - 36.0 g/dL   RDW 11.9 14.7 - 82.9 %   Platelets 153 150 - 400 K/uL   nRBC 0.0 0.0 - 0.2 %   Neutrophils Relative % 61 %   Neutro Abs 2.7 1.7 - 7.7 K/uL   Lymphocytes Relative 25 %   Lymphs Abs 1.1 0.7 - 4.0 K/uL   Monocytes Relative 12 %   Monocytes Absolute 0.5 0.1 - 1.0 K/uL  Eosinophils Relative 0 %   Eosinophils Absolute 0.0 0.0 - 0.5 K/uL    Basophils Relative 2 %   Basophils Absolute 0.1 0.0 - 0.1 K/uL   Immature Granulocytes 0 %   Abs Immature Granulocytes 0.01 0.00 - 0.07 K/uL    Comment: Performed at [facility], [address] [place], [place] [address]    Blood Alcohol level:  Lab Results  Component Value Date   [facility] <10 07/20/2021    Metabolic Disorder Labs: Lab Results  Component Value Date   HGBA1C 5.5 04/10/2022   MPG 111.15 04/10/2022   No results found for: "PROLACTIN" Lab Results  Component Value Date   CHOL 167 11/20/2021   TRIG 107 11/20/2021   HDL 26 (L) 11/20/2021   CHOLHDL 6.4 11/20/2021   VLDL 21 11/20/2021   LDLCALC 120 (H) 11/20/2021    Physical Findings: AIMS: Facial and Oral Movements Muscles of Facial Expression: None, normal Lips and Perioral Area: None, normal Jaw: None, normal Tongue: None, normal,Extremity Movements Upper (arms, wrists, hands, fingers): None, normal Lower (legs, knees, ankles, toes): None, normal, Trunk Movements Neck, shoulders, hips: None, normal, Overall Severity Severity of abnormal movements (highest score from questions above): None, normal Incapacitation due to abnormal movements: None, normal Patient's awareness of abnormal movements (rate only patient's report): No Awareness, Dental Status Current problems with teeth and/or dentures?: No Does patient usually wear dentures?: No  CIWA:    COWS:     Musculoskeletal: Strength & Muscle Tone: within normal limits Gait & Station: normal Patient leans: N/A  Psychiatric Specialty Exam:  Presentation  General Appearance:  Appropriate for Environment; Casual; Neat; Well Groomed  Eye Contact: Fair  Speech: Slow  Speech Volume: Decreased  Handedness: Left   Mood and Affect  Mood: Anxious  Affect: Congruent   Thought Process  Thought Processes: Goal Directed  Descriptions of Associations:Circumstantial  Orientation:Partial  Thought Content:Scattered  History of  Schizophrenia/Schizoaffective disorder:Yes  Duration of Psychotic Symptoms:Greater than six months  Hallucinations:No data recorded Ideas of Reference:None  Suicidal Thoughts:No data recorded Homicidal Thoughts:No data recorded  Sensorium  Memory: Remote Good  Judgment: Fair  Insight: Fair   Art therapist  Concentration: Fair  Attention Span: Fair  Recall: [employer] of Knowledge: Fair  Language: Fair   Psychomotor Activity  Psychomotor Activity:No data recorded  Assets  Assets:No data recorded  Sleep  Sleep:No data recorded   Physical Exam: Physical Exam Vitals and nursing note reviewed.  Constitutional:      Appearance: Normal appearance.  HENT:     Head: Normocephalic and atraumatic.     Mouth/Throat:     Pharynx: Oropharynx is clear.  Eyes:     Pupils: Pupils are equal, round, and reactive to light.  Cardiovascular:     Rate and Rhythm: Normal rate and regular rhythm.  Pulmonary:     Effort: Pulmonary effort is normal.     Breath sounds: Normal breath sounds.  Abdominal:     General: Abdomen is flat.     Palpations: Abdomen is soft.  Musculoskeletal:        General: Normal range of motion.  Skin:    General: Skin is warm and dry.  Neurological:     General: No focal deficit present.     Mental Status: He is alert. Mental status is at baseline.  Psychiatric:        Attention and Perception: He is inattentive.        Mood and Affect: Affect is  blunt.        Speech: He is noncommunicative.    Review of Systems  Unable to perform ROS: Language   Blood pressure 117/89, pulse 94, temperature 97.6 F (36.4 C), temperature source Oral, resp. rate 20, height 5\' 8"  (1.727 m), weight 85.8 kg, SpO2 97 %. Body mass index is 28.76 kg/m.   Treatment Plan Summary: Plan overall clinically he remains stable awaiting discharge.  I do not know why he is suddenly refusing the long-acting injectable but there is certainly no sense in trying  to force it on him.  We are going to change it over to adding oral Invega with his nighttime medicines.  I told him that and he did not appear to have any problem with it.  No other change to treatment plan  , MD 10/12/2022, 11:28 AM

## 2022-10-12 NOTE — [facility] Group Notes (Signed)
[facility] Group Notes:  (Nursing/MHT/Case Management/Adjunct)  Date:  10/12/2022  Time:  9:32 AM  Type of Therapy:   community meeting  Participation Level:  Did Not Attend    Rodena Goldmann 10/12/2022, 9:32 AM

## 2022-10-12 NOTE — Progress Notes (Signed)
Active on the unit.  In a very good mood. In the milieu enjoying his peers.  He is med complaint this evening. Denies si  hi  avh  depression and anxiety. Q 15 minute safety checks in place.     C Butler-Nicholson, LPn

## 2022-10-12 NOTE — Group Note (Signed)
[facility] LCSW Group Therapy Note   Group Date: 10/12/2022 Start Time: 1300 End Time: 1350   Type of Therapy/Topic:  Group Therapy:  Emotion Regulation  Participation Level:  None   Mood:  Description of Group:    The purpose of this group is to assist patients in learning to regulate negative emotions and experience positive emotions. Patients will be guided to discuss ways in which they have been vulnerable to their negative emotions. These vulnerabilities will be juxtaposed with experiences of positive emotions or situations, and patients challenged to use positive emotions to combat negative ones. Special emphasis will be placed on coping with negative emotions in conflict situations, and patients will process healthy conflict resolution skills.  Therapeutic Goals: Patient will identify two positive emotions or experiences to reflect on in order to balance out negative emotions:  Patient will label two or more emotions that they find the most difficult to experience:  Patient will be able to demonstrate positive conflict resolution skills through discussion or role plays:   Summary of Patient Progress: Patient was present in group.  Patient struggled to remain on topic in group stating "yeah heroin", "no alcohol, no" "learning life" and "moon" at random intervals.  Patient was able to be redirected.   Therapeutic Modalities:   Cognitive Behavioral Therapy Feelings Identification Dialectical Behavioral Therapy   Harden Mo, LCSW

## 2022-10-12 NOTE — Plan of Care (Signed)
D- Patient alert and oriented to person, place, and situation. Patient presented in a pleasant mood on assessment stating that he slept ok last night and had no complaints to voice to this Clinical research associate. Patient is childlike and due to a past stroke, he has expressive aphasia. Patient has been a little preoccupied with his cell phone, which was approved by leadership for him to have on the unit. This morning, he was wanting Alinda Money Iommi written on his door, and then he wanted to hear songs by [facility], the band that Alinda Money Iommi is a apart of. Patient denied SI, HI, AVH, and pain at this time. Patient also denied any signs/symptoms of depression and anxiety, by shaking his head and saying "no".  A- Scheduled medications administered to patient, per MD orders. Support and encouragement provided.  Routine safety checks conducted every 15 minutes.  Patient informed to notify staff with problems or concerns.  R- No adverse drug reactions noted. Patient contracts for safety at this time. Patient compliant with medications. Patient receptive, calm, and cooperative. Patient interacts well with others on the unit. Patient remains safe at this time.  Problem: Education: Goal: Ability to state activities that reduce stress will improve Outcome: Progressing   Problem: Coping: Goal: Ability to identify and develop effective coping behavior will improve Outcome: Progressing   Problem: Self-Concept: Goal: Ability to identify factors that promote anxiety will improve Outcome: Progressing Goal: Level of anxiety will decrease Outcome: Progressing Goal: Ability to modify response to factors that promote anxiety will improve Outcome: Progressing   Problem: Education: Goal: Knowledge of General Education information will improve Description: Including pain rating scale, medication(s)/side effects and non-pharmacologic comfort measures Outcome: Progressing   Problem: Health Behavior/Discharge Planning: Goal:  Ability to manage health-related needs will improve Outcome: Progressing   Problem: Clinical Measurements: Goal: Ability to maintain clinical measurements within normal limits will improve Outcome: Progressing Goal: Will remain free from infection Outcome: Progressing Goal: Diagnostic test results will improve Outcome: Progressing Goal: Respiratory complications will improve Outcome: Progressing Goal: Cardiovascular complication will be avoided Outcome: Progressing   Problem: Activity: Goal: Risk for activity intolerance will decrease Outcome: Progressing   Problem: Nutrition: Goal: Adequate nutrition will be maintained Outcome: Progressing   Problem: Coping: Goal: Level of anxiety will decrease Outcome: Progressing   Problem: Elimination: Goal: Will not experience complications related to bowel motility Outcome: Progressing Goal: Will not experience complications related to urinary retention Outcome: Progressing   Problem: Pain Managment: Goal: General experience of comfort will improve Outcome: Progressing   Problem: Safety: Goal: Ability to remain free from injury will improve Outcome: Progressing   Problem: Skin Integrity: Goal: Risk for impaired skin integrity will decrease Outcome: Progressing   Problem: Activity: Goal: Interest or engagement in leisure activities will improve Outcome: Progressing   Problem: Coping: Goal: Coping ability will improve Outcome: Progressing   Problem: Education: Goal: Will be free of psychotic symptoms Outcome: Progressing

## 2022-10-12 NOTE — Plan of Care (Signed)
  Problem: Education: Goal: Ability to state activities that reduce stress will improve Outcome: Progressing   Problem: Self-Concept: Goal: Ability to identify factors that promote anxiety will improve Outcome: Progressing Goal: Level of anxiety will decrease Outcome: Progressing Goal: Ability to modify response to factors that promote anxiety will improve Outcome: Progressing   Problem: Education: Goal: Knowledge of General Education information will improve Description: Including pain rating scale, medication(s)/side effects and non-pharmacologic comfort measures Outcome: Progressing   Problem: Education: Goal: Will be free of psychotic symptoms Outcome: Progressing   Problem: Coping: Goal: Coping ability will improve Outcome: Progressing   Problem: Activity: Goal: Interest or engagement in leisure activities will improve Outcome: Progressing   Problem: Skin Integrity: Goal: Risk for impaired skin integrity will decrease Outcome: Progressing   Problem: Safety: Goal: Ability to remain free from injury will improve Outcome: Progressing

## 2022-10-13 NOTE — Progress Notes (Signed)
[facility] MD Progress Note  10/13/2022 3:42 PM Adam [name]  MRN:  [id number] Subjective: Adam [name] is seen on rounds.  He has been in good controls.  No problems and no complaints.  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 No pertinent surgical history. Family History: History reviewed. No pertinent family history. Family Psychiatric  History: Unremarkable Social History:  Social History   Substance and Sexual Activity  Alcohol Use Not Currently     Social History   Substance and Sexual Activity  Drug Use Not Currently    Social History   Socioeconomic History   Marital status: Single    Spouse name: Not on file   Number of children: Not on file   Years of education: Not on file   Highest education level: Not on file  Occupational History   Not on file  Tobacco Use   Smoking status: Never    Passive exposure: Never   Smokeless tobacco: Never  Vaping Use   Vaping Use: Unknown  Substance and Sexual Activity   Alcohol use: Not Currently   Drug use: Not Currently   Sexual activity: Not Currently  Other Topics Concern   Not on file  Social History Narrative   Not on file   Social Determinants of Health   Financial Resource Strain: Not on file  Food Insecurity: Not on file  Transportation Needs: Not on file  Physical Activity: Not on file   Stress: Not on file  Social Connections: Not on file   Additional Social History:  Specify valuables returned:  (none)                      Sleep: Good  Appetite:  Good  Current Medications: Current Facility-Administered Medications  Medication Dose Route Frequency Provider Last Rate Last Admin   acetaminophen (TYLENOL) tablet 650 mg  650 mg Oral Q6H PRN [name], [name], MD   650 mg at 10/12/22 0531   alum & mag hydroxide-simeth (MAALOX/MYLANTA) 200-200-20 MG/5ML suspension 30 mL  30 mL Oral Q4H PRN [name], [name], MD   30 mL at 04/26/22 1975   aspirin EC tablet 81 mg  81 mg Oral Daily [name], [name], MD   81 mg at 10/13/22 0854   atorvastatin (LIPITOR) tablet 20 mg  20 mg Oral Daily [name], [name], MD   20 mg at 10/13/22 0854   atropine 1 % ophthalmic solution 1 drop  1 drop Sublingual QID [name], MD   1 drop at 10/13/22 1255   benztropine (COGENTIN) tablet 0.5 mg  0.5 mg Oral BID [name], [name], MD   0.5 mg at 10/13/22 0855   clonazePAM (KLONOPIN) tablet 1 mg  1 mg Oral TID PRN He, [name], MD   1 mg at 09/28/22 2130   cloZAPine (CLOZARIL) tablet 300 mg  300 mg Oral QHS [name], [name], MD  300 mg at 10/12/22 2132   diphenhydrAMINE (BENADRYL) capsule 50 mg  50 mg Oral Q6H PRN [name], [name], MD   50 mg at 09/30/22 2119   Or   diphenhydrAMINE (BENADRYL) injection 50 mg  50 mg Intramuscular Q6H PRN [name], [name], MD       gabapentin (NEURONTIN) capsule 300 mg  300 mg Oral TID [name], [name], MD   300 mg at 10/13/22 1516   haloperidol (HALDOL) tablet 2 mg  2 mg Oral TID [name], [name], MD   2 mg at 10/13/22 1516   haloperidol (HALDOL) tablet 5 mg  5 mg Oral Q6H PRN [name], [name], MD   5 mg at 09/17/22 1532   Or   haloperidol lactate (HALDOL) injection 5 mg  5 mg Intramuscular Q6H PRN [name], [name], MD       lisinopril (ZESTRIL) tablet 5 mg  5 mg Oral Daily [name], [name], MD   5 mg at 10/13/22 0854   magnesium hydroxide (MILK OF MAGNESIA) suspension 30 mL  30  mL Oral Daily PRN [name], [name], MD   30 mL at 09/15/22 1047   metoprolol succinate (TOPROL-XL) 24 hr tablet 25 mg  25 mg Oral Daily [name], [name], MD   25 mg at 10/13/22 0855   ondansetron (ZOFRAN) tablet 4 mg  4 mg Oral Q8H PRN Lerry Liner M, NP   4 mg at 09/06/22 2320   paliperidone (INVEGA) 24 hr tablet 6 mg  6 mg Oral QHS [name], [name], MD   6 mg at 10/12/22 2131   senna-docusate (Senokot-S) tablet 2 tablet  2 tablet Oral QHS PRN Sarina Ill, DO   2 tablet at 09/15/22 2110   temazepam (RESTORIL) capsule 15 mg  15 mg Oral QHS [name], [name], MD   15 mg at 10/12/22 2131   ziprasidone (GEODON) injection 20 mg  20 mg Intramuscular Q12H PRN [name], [name], MD   20 mg at 07/08/22 1453    Lab Results: No results found for this or any previous visit (from the past 48 hour(s)).  Blood Alcohol level:  Lab Results  Component Value Date   ETH <10 07/20/2021    Metabolic Disorder Labs: Lab Results  Component Value Date   HGBA1C 5.5 04/10/2022   MPG 111.15 04/10/2022   No results found for: "PROLACTIN" Lab Results  Component Value Date   CHOL 167 11/20/2021   TRIG 107 11/20/2021   HDL 26 (L) 11/20/2021   CHOLHDL 6.4 11/20/2021   VLDL 21 11/20/2021   LDLCALC 120 (H) 11/20/2021    Physical Findings: AIMS: Facial and Oral Movements Muscles of Facial Expression: None, normal Lips and Perioral Area: None, normal Jaw: None, normal Tongue: None, normal,Extremity Movements Upper (arms, wrists, hands, fingers): None, normal Lower (legs, knees, ankles, toes): None, normal, Trunk Movements Neck, shoulders, hips: None, normal, Overall Severity Severity of abnormal movements (highest score from questions above): None, normal Incapacitation due to abnormal movements: None, normal Patient's awareness of abnormal movements (rate only patient's report): No Awareness, Dental Status Current problems with teeth and/or dentures?: No Does patient usually wear dentures?: No   CIWA:    COWS:     Musculoskeletal: Strength & Muscle Tone: within normal limits Gait & Station: normal Patient leans: N/A  Psychiatric Specialty Exam:  Presentation  General Appearance:  Appropriate for Environment; Casual; Neat; Well Groomed  Eye Contact: Fair  Speech: Slow  Speech Volume: Decreased  Handedness: Left  Mood and Affect  Mood: Anxious  Affect: Congruent   Thought Process  Thought Processes: Goal Directed  Descriptions of Associations:Circumstantial  Orientation:Partial  Thought Content:Scattered  History of Schizophrenia/Schizoaffective disorder:Yes  Duration of Psychotic Symptoms:Greater than six months  Hallucinations:No data recorded Ideas of Reference:None  Suicidal Thoughts:No data recorded Homicidal Thoughts:No data recorded  Sensorium  Memory: Remote Good  Judgment: Fair  Insight: Fair   Chartered certified accountant: Fair  Attention Span: Fair  Recall: Fair  Fund of Knowledge: Fair  Language: Fair   Psychomotor Activity  Psychomotor Activity:No data recorded  Assets  Assets:No data recorded  Sleep  Sleep:No data recorded    Blood pressure 113/75, pulse 100, temperature 98.2 F (36.8 C), temperature source Oral, resp. rate 18, height 5\' 8"  (1.727 m), weight 85.8 kg, SpO2 100 %. Body mass index is 28.76 kg/m.   Treatment Plan Summary: Daily contact with patient to assess and evaluate symptoms and progress in treatment, Medication management, and Plan continue current medications.  , DO 10/13/2022, 3:42 PM

## 2022-10-13 NOTE — Plan of Care (Signed)
D- Patient alert and oriented to person, place, and situation. Patient presents in a pleasant mood on assessment stating that he slept ok last night and had no complaints to voice to this Clinical research associate. Patient is childlike and due to a past stroke, he has expressive aphasia. Patient wanted his name on the whiteboard on his door. Then about fifteen minutes later, he came up to the nurses station wanting Alinda Money Iommi put back onto the door. Patient has been fixated on both Alinda Money Iommi and  [employer] today. Although patient checked the box stating that he was experiencing SI on his self-inventory, he denies any of this to this Clinical research associate. Patient denies HI, AVH, and pain at this time. Patient also denies any signs/symptoms of depression and anxiety. Patient had no stated goals for today.  A- Scheduled medications administered to patient, per MD orders. Support and encouragement provided.  Routine safety checks conducted every 15 minutes.  Patient informed to notify staff with problems or concerns.  R- No adverse drug reactions noted. Patient contracts for safety at this time. Patient compliant with medications. Patient receptive, calm, and cooperative. Patient interacts well with others on the unit. Patient remains safe at this time.  Problem: Education: Goal: Ability to state activities that reduce stress will improve Outcome: Progressing   Problem: Coping: Goal: Ability to identify and develop effective coping behavior will improve Outcome: Progressing   Problem: Self-Concept: Goal: Ability to identify factors that promote anxiety will improve Outcome: Progressing Goal: Level of anxiety will decrease Outcome: Progressing Goal: Ability to modify response to factors that promote anxiety will improve Outcome: Progressing   Problem: Education: Goal: Knowledge of General Education information will improve Description: Including pain rating scale, medication(s)/side effects and non-pharmacologic comfort  measures Outcome: Progressing   Problem: Health Behavior/Discharge Planning: Goal: Ability to manage health-related needs will improve Outcome: Progressing   Problem: Clinical Measurements: Goal: Ability to maintain clinical measurements within normal limits will improve Outcome: Progressing Goal: Will remain free from infection Outcome: Progressing Goal: Diagnostic test results will improve Outcome: Progressing Goal: Respiratory complications will improve Outcome: Progressing Goal: Cardiovascular complication will be avoided Outcome: Progressing   Problem: Activity: Goal: Risk for activity intolerance will decrease Outcome: Progressing   Problem: Nutrition: Goal: Adequate nutrition will be maintained Outcome: Progressing   Problem: Coping: Goal: Level of anxiety will decrease Outcome: Progressing   Problem: Elimination: Goal: Will not experience complications related to bowel motility Outcome: Progressing Goal: Will not experience complications related to urinary retention Outcome: Progressing   Problem: Pain Managment: Goal: General experience of comfort will improve Outcome: Progressing   Problem: Safety: Goal: Ability to remain free from injury will improve Outcome: Progressing   Problem: Skin Integrity: Goal: Risk for impaired skin integrity will decrease Outcome: Progressing   Problem: Activity: Goal: Interest or engagement in leisure activities will improve Outcome: Progressing   Problem: Coping: Goal: Coping ability will improve Outcome: Progressing   Problem: Education: Goal: Will be free of psychotic symptoms Outcome: Progressing

## 2022-10-13 NOTE — Plan of Care (Signed)
Pt denies anxiety/depression at this time. Pt denies SI/HI/AVH or pain at this time. Pt is calm and cooperative. Pt is medication compliant. Pt provided with support and encouragement. Pt monitored q15 minutes for safety per unit policy. Plan of care ongoing.   Problem: Education: Goal: Ability to state activities that reduce stress will improve Outcome: Not Progressing   Problem: Coping: Goal: Ability to identify and develop effective coping behavior will improve Outcome: Not Progressing   

## 2022-10-13 NOTE — Progress Notes (Signed)
Patient was cooperative with treatment and medication compliant. He got upset x1 before bed because he was unable to find all parts of his phone charge. He did calm down and he seemed to sleep well through out the night.

## 2022-10-14 ENCOUNTER — Encounter: Payer: Self-pay | Admitting: Psychiatry

## 2022-10-14 DIAGNOSIS — H60392 Other infective otitis externa, left ear: Secondary | ICD-10-CM

## 2022-10-14 DIAGNOSIS — I5022 Chronic systolic (congestive) heart failure: Secondary | ICD-10-CM

## 2022-10-14 DIAGNOSIS — I639 Cerebral infarction, unspecified: Secondary | ICD-10-CM

## 2022-10-14 DIAGNOSIS — E785 Hyperlipidemia, unspecified: Secondary | ICD-10-CM

## 2022-10-14 DIAGNOSIS — I10 Essential (primary) hypertension: Secondary | ICD-10-CM

## 2022-10-14 MED ORDER — AMOXICILLIN-POT CLAVULANATE 875-125 MG PO TABS
1.0000 | ORAL_TABLET | Freq: Two times a day (BID) | ORAL | Status: AC
  Administered 2022-10-14 – 2022-10-20 (×14): 1 via ORAL
  Filled 2022-10-14 (×14): qty 1

## 2022-10-14 MED ORDER — DOXYCYCLINE HYCLATE 100 MG PO TABS
100.0000 mg | ORAL_TABLET | Freq: Two times a day (BID) | ORAL | Status: AC
  Administered 2022-10-14 – 2022-10-20 (×14): 100 mg via ORAL
  Filled 2022-10-14 (×14): qty 1

## 2022-10-14 NOTE — Assessment & Plan Note (Signed)
Continue treatment as per psychiatry team.

## 2022-10-14 NOTE — Assessment & Plan Note (Signed)
Continue lisinopril

## 2022-10-14 NOTE — Progress Notes (Addendum)
[facility] MD Progress Note  10/14/2022 2:28 PM Adam [name]  MRN:  [id number] Subjective: Adam [name] is seen on rounds.  He has been pleasant and cooperative for the most part but he has an infected ear ring and it makes him more irritable.  The nurses asked me to have the hospitalist take a look at it because they said they could not get it out.  Not sure why he has it in the first place but regardless I will have the hospitalist take a look at it.  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  Past Medical History:  Past Medical History:  Diagnosis Date   Myocardial infarction (HCC)    Schizophrenia (HCC)    Stroke [facility])     Past Surgical History:  Procedure Laterality Date   NO PAST SURGERIES     Family History:  Family History  Problem Relation Age of Onset   Hypertension Mother    Family Psychiatric  History: Unremarkable Social History:  Social History   Substance and Sexual Activity  Alcohol Use Not Currently     Social History   Substance and Sexual Activity  Drug Use Not Currently    Social History   Socioeconomic History   Marital status: Single    Spouse name: Not on file   Number of children: Not on file   Years of education: Not on file   Highest education level: Not on file  Occupational History   Not on file  Tobacco Use   Smoking status: Never    Passive exposure: Never   Smokeless tobacco: Never  Vaping Use   Vaping Use: Unknown  Substance and Sexual Activity   Alcohol use: Not  Currently   Drug use: Not Currently   Sexual activity: Not Currently  Other Topics Concern   Not on file  Social History Narrative   Not on file   Social Determinants of Health   Financial Resource Strain: Not on file  Food Insecurity: Not on file  Transportation Needs: Not on file  Physical Activity: Not on file  Stress: Not on file  Social Connections: Not on file   Additional Social History:  Specify valuables returned:  (none)                      Sleep: Good  Appetite:  Good  Current Medications: Current Facility-Administered Medications  Medication Dose Route Frequency Provider Last Rate Last Admin   acetaminophen (TYLENOL) tablet 650 mg  650 mg Oral Q6H PRN [name], [name], MD   650 mg at 10/14/22 1104   alum & mag hydroxide-simeth (MAALOX/MYLANTA) 200-200-20 MG/5ML suspension 30 mL  30 mL Oral Q4H PRN [name], [name], MD   30 mL at 04/26/22 0721   amoxicillin-clavulanate (AUGMENTIN) 875-125 MG per tablet 1 tablet  1 tablet Oral Q12H Wieting, Zailynn Brandel, MD       aspirin EC tablet 81 mg  81 mg Oral Daily [name], [name], MD   81 mg at 10/14/22 1038   atorvastatin (LIPITOR) tablet 20  mg  20 mg Oral Daily [name], [name], MD   20 mg at 10/14/22 1038   atropine 1 % ophthalmic solution 1 drop  1 drop Sublingual QID [name], MD   1 drop at 10/14/22 1137   benztropine (COGENTIN) tablet 0.5 mg  0.5 mg Oral BID [name], [name], MD   0.5 mg at 10/14/22 1038   clonazePAM (KLONOPIN) tablet 1 mg  1 mg Oral TID PRN He, [name], MD   1 mg at 10/14/22 1105   cloZAPine (CLOZARIL) tablet 300 mg  300 mg Oral QHS [name], [name], MD   300 mg at 10/13/22 2100   diphenhydrAMINE (BENADRYL) capsule 50 mg  50 mg Oral Q6H PRN [name], [name], MD   50 mg at 09/30/22 2119   Or   diphenhydrAMINE (BENADRYL) injection 50 mg  50 mg Intramuscular Q6H PRN [name], [name], MD       doxycycline (VIBRA-TABS) tablet 100 mg  100 mg Oral Q12H Wieting, Kailana Benninger, MD       gabapentin (NEURONTIN) capsule  300 mg  300 mg Oral TID [name], [name], MD   300 mg at 10/14/22 1037   haloperidol (HALDOL) tablet 2 mg  2 mg Oral TID [name], [name], MD   2 mg at 10/14/22 1037   haloperidol (HALDOL) tablet 5 mg  5 mg Oral Q6H PRN [name], [name], MD   5 mg at 09/17/22 1532   Or   haloperidol lactate (HALDOL) injection 5 mg  5 mg Intramuscular Q6H PRN [name], [name], MD       lisinopril (ZESTRIL) tablet 5 mg  5 mg Oral Daily [name], [name], MD   5 mg at 10/14/22 1037   magnesium hydroxide (MILK OF MAGNESIA) suspension 30 mL  30 mL Oral Daily PRN [name], [name], MD   30 mL at 09/15/22 1047   metoprolol succinate (TOPROL-XL) 24 hr tablet 25 mg  25 mg Oral Daily [name], [name], MD   25 mg at 10/14/22 1038   ondansetron (ZOFRAN) tablet 4 mg  4 mg Oral Q8H PRN Lerry Liner M, NP   4 mg at 09/06/22 2320   paliperidone (INVEGA) 24 hr tablet 6 mg  6 mg Oral QHS [name], [name], MD   6 mg at 10/13/22 2100   senna-docusate (Senokot-S) tablet 2 tablet  2 tablet Oral QHS PRN Sarina Ill, DO   2 tablet at 09/15/22 2110   temazepam (RESTORIL) capsule 15 mg  15 mg Oral QHS [name], [name], MD   15 mg at 10/13/22 2100   ziprasidone (GEODON) injection 20 mg  20 mg Intramuscular Q12H PRN [name], [name], MD   20 mg at 07/08/22 1453    Lab Results: No results found for this or any previous visit (from the past 48 hour(s)).  Blood Alcohol level:  Lab Results  Component Value Date   ETH <10 07/20/2021    Metabolic Disorder Labs: Lab Results  Component Value Date   HGBA1C 5.5 04/10/2022   MPG 111.15 04/10/2022   No results found for: "PROLACTIN" Lab Results  Component Value Date   CHOL 167 11/20/2021   TRIG 107 11/20/2021   HDL 26 (L) 11/20/2021   CHOLHDL 6.4 11/20/2021   VLDL 21 11/20/2021   LDLCALC 120 (H) 11/20/2021    Physical Findings: AIMS: Facial and Oral Movements Muscles of Facial Expression: None, normal Lips and Perioral Area: None, normal Jaw: None, normal Tongue: None,  normal,Extremity  Movements Upper (arms, wrists, hands, fingers): None, normal Lower (legs, knees, ankles, toes): None, normal, Trunk Movements Neck, shoulders, hips: None, normal, Overall Severity Severity of abnormal movements (highest score from questions above): None, normal Incapacitation due to abnormal movements: None, normal Patient's awareness of abnormal movements (rate only patient's report): No Awareness, Dental Status Current problems with teeth and/or dentures?: No Does patient usually wear dentures?: No  CIWA:    COWS:     Musculoskeletal: Strength & Muscle Tone: within normal limits Gait & Station: normal Patient leans: N/A  Psychiatric Specialty Exam:  Presentation  General Appearance:  Appropriate for Environment; Casual; Neat; Well Groomed  Eye Contact: Fair  Speech: Slow  Speech Volume: Decreased  Handedness: Left   Mood and Affect  Mood: Anxious  Affect: Congruent   Thought Process  Thought Processes: Goal Directed  Descriptions of Associations:Circumstantial  Orientation:Partial  Thought Content:Scattered  History of Schizophrenia/Schizoaffective disorder:Yes  Duration of Psychotic Symptoms:Greater than six months  Hallucinations:No data recorded Ideas of Reference:None  Suicidal Thoughts:No data recorded Homicidal Thoughts:No data recorded  Sensorium  Memory: Remote Good  Judgment: Fair  Insight: Fair   Chartered certified accountant: Fair  Attention Span: Fair  Recall: Fair  Fund of Knowledge: Fair  Language: Fair   Psychomotor Activity  Psychomotor Activity:No data recorded  Assets  Assets:No data recorded  Sleep  Sleep:No data recorded    Blood pressure 109/76, pulse 100, temperature 97.9 F (36.6 C), temperature source Oral, resp. rate 16, height 5\' 8"  (1.727 m), weight 85.8 kg, SpO2 100 %. Body mass index is 28.76 kg/m.   Treatment Plan Summary: Daily contact with patient to  assess and evaluate symptoms and progress in treatment, Medication management, and Plan consult hospitalist for infected ear and continue current medications.  , DO 10/14/2022, 2:28 PM

## 2022-10-14 NOTE — Plan of Care (Signed)
Patient mood and affect generally at baseline today but with some increased irritability related to an earring that was stuck in his left earlobe, red, swollen and painful.  Pt reported intense pain and two RN tried to remove the earring but were unsuccessful.  Hospitalist referral was made and MD was able to remove the earring.  The back of the earring could not be located. Earring was disposed of per the patient's request.  Pt was started on two antibiotics and pain was managed with warm compresses and tylenol.  Pt denies SI HI AVH but has been preoccupied with his earlobe discomfort today.  Staff continue to reinforce need for warm compresses.  Continue to monitor for safety with q 15 minute checks.

## 2022-10-14 NOTE — Assessment & Plan Note (Signed)
No current signs of heart failure.  Lisinopril to help out with contractility of the heart.

## 2022-10-14 NOTE — Assessment & Plan Note (Signed)
Patient on aspirin and Lipitor.

## 2022-10-14 NOTE — Assessment & Plan Note (Signed)
-  Continue Lipitor °

## 2022-10-14 NOTE — Consult Note (Signed)
Initial Consultation Note   Patient: Adam [name] DZH:[id number] DOB: 1961-03-20 PCP: Pcp, No DOA: 03/19/2022 DOS: the patient was seen and examined on 10/14/2022 Primary service: Audery Amel, MD  Referring physician: Dr Marlou Porch Reason for consult: Left earlobe swollen and infected and to remove earring  Assessment/Plan: Assessment and Plan: * Infection of left earlobe The earing did not have a back on it.  The earring was removed (stud only).  The patient still has some fullness in his left earlobe lower than the site of where the earring came out.  Will start warm compresses every 4 hours for 20 minutes only for a few days.  Case discussed with pharmacist and will prescribe doxycycline and Augmentin for 7 days.  Schizophrenia, chronic condition with acute exacerbation (HCC) Continue treatment as per psychiatry team.  Chronic systolic CHF (congestive heart failure) (HCC) No current signs of heart failure.  Lisinopril to help out with contractility of the heart.  HTN (hypertension) Continue lisinopril  HLD (hyperlipidemia) Continue Lipitor  Stroke [facility]) Patient on aspirin and Lipitor.       TRH will sign off at present, please call us again when needed.  HPI: Adam [name] is a 61 y.o. male with past medical history of CAD, stroke, hypertension, schizophrenia.  Patient complaining of some left ear pain and swelling.  Patient found to have a left earlobe infection secondary to earing.  Hospitalist were contacted for earing removal and management.  Review of Systems: Review of Systems  Constitutional:  Negative for fever.  HENT:  Negative for sore throat.   Eyes:  Negative for blurred vision.  Respiratory:  Negative for cough.   Cardiovascular:  Negative for chest pain.  Gastrointestinal:  Negative for abdominal pain.  Genitourinary:  Negative for dysuria.  Musculoskeletal:  Negative for myalgias.  Skin:  Negative for rash.  Neurological:  Negative for dizziness.   Endo/Heme/Allergies:  Does not bruise/bleed easily.    Past Medical History:  Diagnosis Date   Myocardial infarction (HCC)    Schizophrenia (HCC)    Stroke [name])    Past Surgical History:  Procedure Laterality Date   NO PAST SURGERIES     Social History:  reports that he has never smoked. He has never been exposed to tobacco smoke. He has never used smokeless tobacco. He reports that he does not currently use alcohol. He reports that he does not currently use drugs.  Allergies  Allergen Reactions   Morphine And Related    Nsaids     Family History  Problem Relation Age of Onset   Hypertension Mother     Prior to Admission medications   Medication Sig Start Date End Date Taking? Authorizing Provider  acetaminophen (TYLENOL) 325 MG tablet Take 2 tablets (650 mg total) by mouth every 4 (four) hours as needed for headache or mild pain. 03/19/22   Alford Highland, MD  alum & mag hydroxide-simeth (MAALOX/MYLANTA) 200-200-20 MG/5ML suspension Take 30 mLs by mouth every 6 (six) hours as needed for indigestion or heartburn. 03/19/22   Alford Highland, MD  aspirin 81 MG chewable tablet Chew 1 tablet (81 mg total) by mouth daily. 03/20/22   Alford Highland, MD  atorvastatin (LIPITOR) 20 MG tablet Take 1 tablet (20 mg total) by mouth daily. 03/20/22   Alford Highland, MD  benztropine (COGENTIN) 0.5 MG tablet Take 1 tablet (0.5 mg total) by mouth 2 (two) times daily. 03/19/22   Alford Highland, MD  clonazePAM (KLONOPIN) 1 MG tablet Take 1 tablet (1 mg  total) by mouth 3 (three) times daily. 03/19/22   Alford Highland, MD  cloZAPine (CLOZARIL) 100 MG tablet Take 3 tablets (300 mg total) by mouth at bedtime. 03/19/22   Alford Highland, MD  gabapentin (NEURONTIN) 300 MG capsule Take 300 mg by mouth 3 (three) times daily.    [provider]  haloperidol (HALDOL) 5 MG tablet Take 5 mg by mouth 3 (three) times daily.    [provider]  haloperidol (HALDOL) 5 MG tablet Take 1  tablet (5 mg total) by mouth every 4 (four) hours as needed for agitation. 03/19/22   Alford Highland, MD  LORazepam (ATIVAN) 1 MG tablet Take 1 tablet (1 mg total) by mouth every 4 (four) hours as needed for anxiety. 03/19/22   Alford Highland, MD  metoprolol succinate (TOPROL-XL) 25 MG 24 hr tablet Take 1 tablet (25 mg total) by mouth daily. 03/20/22   Alford Highland, MD  Multiple Vitamin (MULTIVITAMIN) tablet Take 1 tablet by mouth daily.    [provider]  paliperidone (INVEGA SUSTENNA) 234 MG/1.5ML SUSY injection Inject 234 mg into the muscle every 28 (twenty-eight) days.    [provider]  sodium chloride (OCEAN) 0.65 % SOLN nasal spray Place 1 spray into both nostrils as needed for congestion. 03/19/22   Alford Highland, MD  temazepam (RESTORIL) 15 MG capsule Take 1 capsule (15 mg total) by mouth at bedtime. 03/19/22   Alford Highland, MD    Physical Exam: Vitals:   10/12/22 1828 10/13/22 0633 10/13/22 1649 10/14/22 0900  BP: 108/81 113/75 124/78 109/76  Pulse: 94 100 91 100  Resp: 20 18 18 16   Temp: 98.7 F (37.1 C) 98.2 F (36.8 C) 99 F (37.2 C) 97.9 F (36.6 C)  TempSrc: Oral Oral Oral Oral  SpO2: 100% 100% 100% 100%  Weight:      Height:       Physical Exam HENT:     Head: Normocephalic.     Mouth/Throat:     Pharynx: No oropharyngeal exudate.  Eyes:     General: Lids are normal.     Conjunctiva/sclera: Conjunctivae normal.  Cardiovascular:     Rate and Rhythm: Normal rate and regular rhythm.     Heart sounds: Normal heart sounds, S1 normal and S2 normal.  Pulmonary:     Breath sounds: No decreased breath sounds, wheezing, rhonchi or rales.  Abdominal:     Palpations: Abdomen is soft.     Tenderness: There is no abdominal tenderness.  Musculoskeletal:     Right lower leg: No swelling.     Left lower leg: No swelling.  Skin:    General: Skin is warm.     Comments: Left earlobe swollen and with some erythema  Neurological:     Mental  Status: He is alert.     Data Reviewed:  Last white blood cell count 4.4 with, last hemoglobin 13.0, last platelet count 153  Primary team communication: Spoke with consulting doctor for consultation.  Spoke with nursing staff. Thank you very much for involving in the care of your patient.  Author: [place], MD 10/14/2022 12:19 PM  For on call review [email or web address].

## 2022-10-14 NOTE — Plan of Care (Signed)
Pt denies anxiety/depression at this time. Pt denies SI/HI/AVH or pain at this time. Pt is calm and cooperative. Pt is medication compliant. Pt provided with support and encouragement. Pt monitored q15 minutes for safety per unit policy. Plan of care ongoing.   Problem: Self-Concept: Goal: Level of anxiety will decrease Outcome: Progressing   Problem: Coping: Goal: Level of anxiety will decrease Outcome: Progressing

## 2022-10-14 NOTE — Assessment & Plan Note (Signed)
The earing did not have a back on it.  The earring was removed (stud only).  The patient still has some fullness in his left earlobe lower than the site of where the earring came out.  Will start warm compresses every 4 hours for 20 minutes only for a few days.  Case discussed with pharmacist and will prescribe doxycycline and Augmentin for 7 days.

## 2022-10-14 NOTE — Progress Notes (Signed)
Patient repeatedly yelling and walking back and forth from the nurses station back to bedroom. Patient endorses ear pain from earring being stuck in ear. DO notified and was asked to put in a hospitalist consult for patient.

## 2022-10-15 NOTE — Progress Notes (Signed)
Patient calm and cooperative during assessment denying SI/HI/AVH. Patient observed interacting appropriately with staff and peers on the unit. Patient compliant with medication administration per MD orders. Pt being monitored Q 15 minutes for safety per unit protocol, remains safe on the unit  

## 2022-10-15 NOTE — Progress Notes (Signed)
Recreation Therapy Notes  Date: 10/15/2022  Time: 10:50am    Location: Courtyard    Behavioral response: Appropriate  Intervention Topic:  Stress Management   Discussion/Intervention:  Group content on today was focused on stress. The group defined stress and way to cope with stress. Participants expressed how they know when they are stresses out. Individuals described the different ways they have to cope with stress. The group stated reasons why it is important to cope with stress. Patient explained what good stress is and some examples. The group participated in the intervention "Stress Management". Individuals were separated into two group and answered questions related to stress.  Clinical Observations/Feedback: Patient came to group and was focused on what peers and staff had to say about stress management. Individual was social with peers and staff while participating in the intervention.     Jock Mahon LRT/CTRS         Nyrah Demos 10/15/2022 1:30 PM

## 2022-10-15 NOTE — Group Note (Signed)
[facility] LCSW Group Therapy Note    Group Date: 10/15/2022 Start Time: 1315 End Time: 1415  Type of Therapy and Topic:  Group Therapy:  Overcoming Obstacles  Participation Level:  [facility] PARTICIPATION LEVEL: None   Description of Group:   In this group patients will be encouraged to explore what they see as obstacles to their own wellness and recovery. They will be guided to discuss their thoughts, feelings, and behaviors related to these obstacles. The group will process together ways to cope with barriers, with attention given to specific choices patients can make. Each patient will be challenged to identify changes they are motivated to make in order to overcome their obstacles. This group will be process-oriented, with patients participating in exploration of their own experiences as well as giving and receiving support and challenge from other group members.  Therapeutic Goals: 1. Patient will identify personal and current obstacles as they relate to admission. 2. Patient will identify barriers that currently interfere with their wellness or overcoming obstacles.  3. Patient will identify feelings, thought process and behaviors related to these barriers. 4. Patient will identify two changes they are willing to make to overcome these obstacles:    Summary of Patient Progress Patient was present for the entirety of the group process but did not participate in the discussion.   Therapeutic Modalities:   Cognitive Behavioral Therapy Solution Focused Therapy Motivational Interviewing Relapse Prevention Therapy   Glenis Smoker, LCSW

## 2022-10-15 NOTE — Progress Notes (Signed)
[facility] MD Progress Note follow-up 61 year old man with schizophrenia.  10/15/2022 5:04 PM Adam [name]  MRN:  [id number] Subjective: Follow-up 61 year old man with schizophrenia.  No new complaints.  Seen by medicine yesterday and they took care of the infection in his earlobe.  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)   Stroke (HCC)   HTN (hypertension)   HLD (hyperlipidemia)   CAD (coronary artery disease)   Chronic systolic CHF (congestive heart failure) (HCC)   Anxiety  Total Time spent with patient: 20 minutes  Past Psychiatric History: Schizophrenia  Past Medical History:  Past Medical History:  Diagnosis Date   Myocardial infarction (HCC)    Schizophrenia (HCC)    Stroke (HCC)     Past Surgical History:  Procedure Laterality Date   NO PAST SURGERIES     Family History:  Family History  Problem Relation Age of Onset   Hypertension Mother    Family Psychiatric  History: None Social History:  Social History   Substance and Sexual Activity  Alcohol Use Not Currently     Social History   Substance and Sexual Activity  Drug Use Not Currently    Social History   Socioeconomic History   Marital status: Single    Spouse name: Not on file   Number of children: Not on file   Years of education: Not on file   Highest education level: Not on file  Occupational History   Not on file  Tobacco Use   Smoking status: Never    Passive exposure: Never   Smokeless tobacco: Never  Vaping Use   Vaping Use: Unknown  Substance and Sexual Activity   Alcohol use: Not Currently   Drug use: Not Currently   Sexual activity: Not Currently  Other Topics Concern   Not on file  Social History Narrative   Not on file   Social Determinants of  Health   Financial Resource Strain: Not on file  Food Insecurity: Not on file  Transportation Needs: Not on file  Physical Activity: Not on file  Stress: Not on file  Social Connections: Not on file   Additional Social History:  Specify valuables returned:  (none)                      Sleep: Fair  Appetite:  Fair  Current Medications: Current Facility-Administered Medications  Medication Dose Route Frequency Provider Last Rate Last Admin   acetaminophen (TYLENOL) tablet 650 mg  650 mg Oral Q6H PRN Willine Schwalbe T, MD   650 mg at 10/14/22 2328   alum & mag hydroxide-simeth (MAALOX/MYLANTA) 200-200-20 MG/5ML suspension 30 mL  30 mL Oral Q4H PRN Kataleyah Carducci T, MD   30 mL at 04/26/22 0721   amoxicillin-clavulanate (AUGMENTIN) 875-125 MG per tablet 1 tablet  1 tablet Oral Q12H Wieting, Richard, MD   1 tablet at 10/15/22 0900   aspirin EC tablet 81 mg  81 mg Oral Daily Kayliegh Boyers T, MD   81 mg at 10/15/22 0900   atorvastatin (LIPITOR) tablet 20 mg  20 mg Oral Daily Diva Lemberger T, MD   20 mg at 10/15/22 0900   atropine 1 % ophthalmic solution 1 drop  1 drop Sublingual QID [name], MD   1 drop at 10/15/22 1205   benztropine (COGENTIN)  tablet 0.5 mg  0.5 mg Oral BID Jett Fukuda T, MD   0.5 mg at 10/15/22 0900   clonazePAM (KLONOPIN) tablet 1 mg  1 mg Oral TID PRN He, [name], MD   1 mg at 10/14/22 1105   cloZAPine (CLOZARIL) tablet 300 mg  300 mg Oral QHS Ewen Varnell T, MD   300 mg at 10/14/22 2118   diphenhydrAMINE (BENADRYL) capsule 50 mg  50 mg Oral Q6H PRN Kristyna Bradstreet, [name], MD   50 mg at 09/30/22 2119   Or   diphenhydrAMINE (BENADRYL) injection 50 mg  50 mg Intramuscular Q6H PRN Tippi Mccrae T, MD       doxycycline (VIBRA-TABS) tablet 100 mg  100 mg Oral Q12H Wieting, Richard, MD   100 mg at 10/15/22 0900   gabapentin (NEURONTIN) capsule 300 mg  300 mg Oral TID Yanai Hobson T, MD   300 mg at 10/15/22 1000   haloperidol (HALDOL) tablet 2 mg  2 mg Oral TID Quaron Delacruz,  Naylah Cork T, MD   2 mg at 10/15/22 1100   haloperidol (HALDOL) tablet 5 mg  5 mg Oral Q6H PRN Carver Murakami T, MD   5 mg at 09/17/22 1532   Or   haloperidol lactate (HALDOL) injection 5 mg  5 mg Intramuscular Q6H PRN Tiyana Galla T, MD       lisinopril (ZESTRIL) tablet 5 mg  5 mg Oral Daily Charmian Forbis T, MD   5 mg at 10/15/22 0900   magnesium hydroxide (MILK OF MAGNESIA) suspension 30 mL  30 mL Oral Daily PRN Shronda Boeh T, MD   30 mL at 09/15/22 1047   metoprolol succinate (TOPROL-XL) 24 hr tablet 25 mg  25 mg Oral Daily Illyanna Petillo T, MD   25 mg at 10/15/22 0900   ondansetron (ZOFRAN) tablet 4 mg  4 mg Oral Q8H PRN Jearld Lesch, NP   4 mg at 09/06/22 2320   paliperidone (INVEGA) 24 hr tablet 6 mg  6 mg Oral QHS Nate Common T, MD   6 mg at 10/14/22 2119   senna-docusate (Senokot-S) tablet 2 tablet  2 tablet Oral QHS PRN Sarina Ill, DO   2 tablet at 09/15/22 2110   temazepam (RESTORIL) capsule 15 mg  15 mg Oral QHS Adrain Butrick T, MD   15 mg at 10/14/22 2118   ziprasidone (GEODON) injection 20 mg  20 mg Intramuscular Q12H PRN Miata Culbreth, [name], MD   20 mg at 07/08/22 1453    Lab Results: No results found for this or any previous visit (from the past 48 hour(s)).  Blood Alcohol level:  Lab Results  Component Value Date   ETH <10 07/20/2021    Metabolic Disorder Labs: Lab Results  Component Value Date   HGBA1C 5.5 04/10/2022   MPG 111.15 04/10/2022   No results found for: "PROLACTIN" Lab Results  Component Value Date   CHOL 167 11/20/2021   TRIG 107 11/20/2021   HDL 26 (L) 11/20/2021   CHOLHDL 6.4 11/20/2021   VLDL 21 11/20/2021   LDLCALC 120 (H) 11/20/2021    Physical Findings: AIMS: Facial and Oral Movements Muscles of Facial Expression: None, normal Lips and Perioral Area: None, normal Jaw: None, normal Tongue: None, normal,Extremity Movements Upper (arms, wrists, hands, fingers): None, normal Lower (legs, knees, ankles, toes): None, normal, Trunk  Movements Neck, shoulders, hips: None, normal, Overall Severity Severity of abnormal movements (highest score from questions above): None, normal Incapacitation due to abnormal movements: None, normal  Patient's awareness of abnormal movements (rate only patient's report): No Awareness, Dental Status Current problems with teeth and/or dentures?: No Does patient usually wear dentures?: No  CIWA:    COWS:     Musculoskeletal: Strength & Muscle Tone: within normal limits Gait & Station: normal Patient leans: N/A  Psychiatric Specialty Exam:  Presentation  General Appearance:  Appropriate for Environment; Casual; Neat; Well Groomed  Eye Contact: Fair  Speech: Slow  Speech Volume: Decreased  Handedness: Left   Mood and Affect  Mood: Anxious  Affect: Congruent   Thought Process  Thought Processes: Goal Directed  Descriptions of Associations:Circumstantial  Orientation:Partial  Thought Content:Scattered  History of Schizophrenia/Schizoaffective disorder:Yes  Duration of Psychotic Symptoms:Greater than six months  Hallucinations:No data recorded Ideas of Reference:None  Suicidal Thoughts:No data recorded Homicidal Thoughts:No data recorded  Sensorium  Memory: Remote Good  Judgment: Fair  Insight: Fair   Art therapist  Concentration: Fair  Attention Span: Fair  Recall: [employer] of Knowledge: Fair  Language: Fair   Psychomotor Activity  Psychomotor Activity:No data recorded  Assets  Assets:No data recorded  Sleep  Sleep:No data recorded   Physical Exam: Physical Exam Vitals and nursing note reviewed.  Constitutional:      Appearance: Normal appearance.  HENT:     Head: Normocephalic and atraumatic.     Mouth/Throat:     Pharynx: Oropharynx is clear.  Eyes:     Pupils: Pupils are equal, round, and reactive to light.  Cardiovascular:     Rate and Rhythm: Normal rate and regular rhythm.  Pulmonary:     Effort:  Pulmonary effort is normal.     Breath sounds: Normal breath sounds.  Abdominal:     General: Abdomen is flat.     Palpations: Abdomen is soft.  Musculoskeletal:        General: Normal range of motion.  Skin:    General: Skin is warm and dry.  Neurological:     General: No focal deficit present.     Mental Status: He is alert. Mental status is at baseline.  Psychiatric:        Attention and Perception: Attention normal.        Mood and Affect: Mood normal. Affect is blunt.        Speech: He is noncommunicative. Speech is delayed.    Review of Systems  Constitutional: Negative.   HENT: Negative.    Eyes: Negative.   Respiratory: Negative.    Cardiovascular: Negative.   Gastrointestinal: Negative.   Musculoskeletal: Negative.   Skin: Negative.   Neurological: Negative.   Psychiatric/Behavioral: Negative.     Blood pressure 121/69, pulse (!) 117, temperature 98.5 F (36.9 C), temperature source Oral, resp. rate 18, height 5\' 8"  (1.727 m), weight 85.8 kg, SpO2 99 %. Body mass index is 28.76 kg/m.   Treatment Plan Summary: Plan absolutely no change.  Just waiting on placement  , MD 10/15/2022, 5:04 PM

## 2022-10-15 NOTE — Plan of Care (Signed)
Patient yelling at staff to get his needs done. No distress noted. Compliant with medications. Patient denies SI,HI and AVH. Support and encouragement given.

## 2022-10-16 NOTE — Group Note (Signed)
[facility] LCSW Group Therapy Note   Group Date: 10/16/2022 Start Time: 1300 End Time: 1400  Type of Therapy/Topic:  Group Therapy:  Feelings about Diagnosis  Participation Level:  None   Description of Group:    This group will allow patients to explore their thoughts and feelings about diagnoses they have received. Patients will be guided to explore their level of understanding and acceptance of these diagnoses. Facilitator will encourage patients to process their thoughts and feelings about the reactions of others to their diagnosis, and will guide patients in identifying ways to discuss their diagnosis with significant others in their lives. This group will be process-oriented, with patients participating in exploration of their own experiences as well as giving and receiving support and challenge from other group members.   Therapeutic Goals: 1. Patient will demonstrate understanding of diagnosis as evidence by identifying two or more symptoms of the disorder:  2. Patient will be able to express two feelings regarding the diagnosis 3. Patient will demonstrate ability to communicate their needs through discussion and/or role plays  Summary of Patient Progress: Patient was present in group.  Patient often spoke out of turn saying things like "college", "Ardelle Anton" and another patients name.  Patient often parroted the statements of others.   Therapeutic Modalities:   Cognitive Behavioral Therapy Brief Therapy Feelings Identification    Harden Mo, LCSW

## 2022-10-16 NOTE — Plan of Care (Addendum)
D- Patient alert and oriented to person, place, and situation. Patient initially presented in an irritable mood on assessment yelling out "college, college" and pointing to the information board. It was later discovered that patient was trying to tell staff that he went to [employer] in [place]. Patient later became a little agitated being that he couldn't get in touch with his Mom. This writer placed the long-distance call for him and once he spoke to his mother, his whole mood changed for the better. Patient then became fixated on Audrie Gallus, from [facility], once again and wanting to listen to his music. 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 Patient reported that he slept good last night and had no complaints to voice to this Clinical research associate. Patient denied SI, HI, AVH, and pain at this time. Patient also denied any signs/symptoms of depression and anxiety. Patient had no stated goals for today.  A- Scheduled medications administered to patient, per MD orders. Support and encouragement provided.  Routine safety checks conducted every 15 minutes.  Patient informed to notify staff with problems or concerns.  R- No adverse drug reactions noted. Patient contracts for safety at this time. Patient compliant with medications and treatment plan. Patient receptive, calm, and cooperative. Patient remains safe at this time.  Problem: Education: Goal: Ability to state activities that reduce stress will improve Outcome: Not Progressing   Problem: Coping: Goal: Ability to identify and develop effective coping behavior will improve Outcome: Not Progressing   Problem: Self-Concept: Goal: Ability to identify factors that promote anxiety will improve Outcome: Not Progressing Goal: Level of anxiety will decrease Outcome: Not Progressing Goal: Ability to modify response to factors that promote anxiety will improve Outcome: Not Progressing   Problem: Education: Goal: Knowledge  of General Education information will improve Description: Including pain rating scale, medication(s)/side effects and non-pharmacologic comfort measures Outcome: Not Progressing   Problem: Health Behavior/Discharge Planning: Goal: Ability to manage health-related needs will improve Outcome: Not Progressing   Problem: Clinical Measurements: Goal: Ability to maintain clinical measurements within normal limits will improve Outcome: Not Progressing Goal: Will remain free from infection Outcome: Not Progressing Goal: Diagnostic test results will improve Outcome: Not Progressing Goal: Respiratory complications will improve Outcome: Not Progressing Goal: Cardiovascular complication will be avoided Outcome: Not Progressing   Problem: Activity: Goal: Risk for activity intolerance will decrease Outcome: Not Progressing   Problem: Nutrition: Goal: Adequate nutrition will be maintained Outcome: Not Progressing   Problem: Coping: Goal: Level of anxiety will decrease Outcome: Not Progressing   Problem: Elimination: Goal: Will not experience complications related to bowel motility Outcome: Not Progressing Goal: Will not experience complications related to urinary retention Outcome: Not Progressing   Problem: Pain Managment: Goal: General experience of comfort will improve Outcome: Not Progressing   Problem: Safety: Goal: Ability to remain free from injury will improve Outcome: Not Progressing   Problem: Skin Integrity: Goal: Risk for impaired skin integrity will decrease Outcome: Not Progressing   Problem: Activity: Goal: Interest or engagement in leisure activities will improve Outcome: Not Progressing   Problem: Coping: Goal: Coping ability will improve Outcome: Not Progressing   Problem: Education: Goal: Will be free of psychotic symptoms Outcome: Not Progressing

## 2022-10-16 NOTE — BHH Counselor (Signed)
CSW has reached out via text and phone to both Valerie Roys, group home owner and Annice Pih her assistant.  CSW unable to leave voicemails as both phones voicemails were full.  CSW received call from [place] with [facility] requesting an update.    CSW explained that she had reached out this morning with no response.  CSW reports that she can attempt again.  Linsey reports that she last spoke with group home on Wednesday 10/10/2022 to go over questions as indicated in the meeting.  CSW has not reached out to group home since meeting, awaiting further direction from [facility] and/or group home that concerns were addressed and discharge date could move forward.  CSW reports that she will follow up with [facility].   [name], MSW, LCSW 10/16/2022 11:21 AM

## 2022-10-16 NOTE — Progress Notes (Signed)
[facility] MD Progress Note  10/16/2022 11:20 AM Adam [name]  MRN:  [id number] Subjective: Patient seen and chart reviewed.  Patient seems to be perhaps in a slightly better mood today than the last day or 2.  Smiling and made eye contact.  Wanted to talk a little bit.  Not hostile or threatening.  Out in the day room interacting a little more.  Taking care of his hygiene fine.  The infection in his ear seems to be improving. 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)   Stroke (HCC)   HTN (hypertension)   HLD (hyperlipidemia)   CAD (coronary artery disease)   Chronic systolic CHF (congestive heart failure) (HCC)   Anxiety   Infection of left earlobe  Total Time spent with patient: 30 minutes  Past Psychiatric History: Past history of schizophrenia chronic aphasia  Past Medical History:  Past Medical History:  Diagnosis Date   Myocardial infarction (HCC)    Schizophrenia (HCC)    Stroke (HCC)     Past Surgical History:  Procedure Laterality Date   NO PAST SURGERIES     Family History:  Family History  Problem Relation Age of Onset   Hypertension Mother    Family Psychiatric  History: See previous Social History:  Social History   Substance and Sexual Activity  Alcohol Use Not Currently     Social History   Substance and Sexual Activity  Drug Use Not Currently    Social History   Socioeconomic History   Marital status: Single    Spouse name: Not on file   Number of children: Not on file   Years of education: Not on file   Highest education level: Not on file  Occupational History   Not on file  Tobacco Use   Smoking status: Never    Passive exposure: Never   Smokeless tobacco: Never  Vaping Use   Vaping Use: Unknown  Substance and Sexual  Activity   Alcohol use: Not Currently   Drug use: Not Currently   Sexual activity: Not Currently  Other Topics Concern   Not on file  Social History Narrative   Not on file   Social Determinants of Health   Financial Resource Strain: Not on file  Food Insecurity: Not on file  Transportation Needs: Not on file  Physical Activity: Not on file  Stress: Not on file  Social Connections: Not on file   Additional Social History:  Specify valuables returned:  (none)                      Sleep: Fair  Appetite:  Fair  Current Medications: Current Facility-Administered Medications  Medication Dose Route Frequency Provider Last Rate Last Admin   acetaminophen (TYLENOL) tablet 650 mg  650 mg Oral Q6H PRN Matayah Reyburn T, MD   650 mg at 10/14/22 2328   alum & mag hydroxide-simeth (MAALOX/MYLANTA) 200-200-20 MG/5ML suspension 30 mL  30 mL Oral Q4H PRN Kailana Benninger T, MD   30 mL at 04/26/22 0721   amoxicillin-clavulanate (AUGMENTIN) 875-125 MG per tablet 1 tablet  1 tablet Oral Q12H Alford Highland, MD   1 tablet at 10/16/22 0851   aspirin EC tablet 81 mg  81 mg Oral Daily Beckam Abdulaziz, [name], MD   81 mg at 10/16/22 0748   atorvastatin (LIPITOR) tablet 20  mg  20 mg Oral Daily Adajah Cocking, [name], MD   20 mg at 10/16/22 0748   atropine 1 % ophthalmic solution 1 drop  1 drop Sublingual QID [name], MD   1 drop at 10/16/22 0748   benztropine (COGENTIN) tablet 0.5 mg  0.5 mg Oral BID Tangy Drozdowski T, MD   0.5 mg at 10/16/22 0748   clonazePAM (KLONOPIN) tablet 1 mg  1 mg Oral TID PRN He, [name], MD   1 mg at 10/14/22 1105   cloZAPine (CLOZARIL) tablet 300 mg  300 mg Oral QHS Mendell Bontempo T, MD   300 mg at 10/15/22 2055   diphenhydrAMINE (BENADRYL) capsule 50 mg  50 mg Oral Q6H PRN Azarria Balint, [name], MD   50 mg at 09/30/22 2119   Or   diphenhydrAMINE (BENADRYL) injection 50 mg  50 mg Intramuscular Q6H PRN Elexa Kivi T, MD       doxycycline (VIBRA-TABS) tablet 100 mg  100 mg Oral Q12H  Wieting, Richard, MD   100 mg at 10/16/22 0851   gabapentin (NEURONTIN) capsule 300 mg  300 mg Oral TID Lania Zawistowski T, MD   300 mg at 10/16/22 0900   haloperidol (HALDOL) tablet 2 mg  2 mg Oral TID Maisa Bedingfield T, MD   2 mg at 10/16/22 0900   haloperidol (HALDOL) tablet 5 mg  5 mg Oral Q6H PRN [name] Friscia T, MD   5 mg at 09/17/22 1532   Or   haloperidol lactate (HALDOL) injection 5 mg  5 mg Intramuscular Q6H PRN Sherriann Szuch T, MD       lisinopril (ZESTRIL) tablet 5 mg  5 mg Oral Daily Kiptyn Rafuse T, MD   5 mg at 10/16/22 0748   magnesium hydroxide (MILK OF MAGNESIA) suspension 30 mL  30 mL Oral Daily PRN Ladaisha Portillo T, MD   30 mL at 09/15/22 1047   metoprolol succinate (TOPROL-XL) 24 hr tablet 25 mg  25 mg Oral Daily [name] T, MD   25 mg at 10/16/22 0748   ondansetron (ZOFRAN) tablet 4 mg  4 mg Oral Q8H PRN Lerry Liner M, NP   4 mg at 09/06/22 2320   paliperidone (INVEGA) 24 hr tablet 6 mg  6 mg Oral QHS Sonda Coppens T, MD   6 mg at 10/15/22 2055   senna-docusate (Senokot-S) tablet 2 tablet  2 tablet Oral QHS PRN Sarina Ill, DO   2 tablet at 09/15/22 2110   temazepam (RESTORIL) capsule 15 mg  15 mg Oral QHS Lendell Gallick T, MD   15 mg at 10/15/22 2055   ziprasidone (GEODON) injection 20 mg  20 mg Intramuscular Q12H PRN Sevyn Markham, [name], MD   20 mg at 07/08/22 1453    Lab Results: No results found for this or any previous visit (from the past 48 hour(s)).  Blood Alcohol level:  Lab Results  Component Value Date   ETH <10 07/20/2021    Metabolic Disorder Labs: Lab Results  Component Value Date   HGBA1C 5.5 04/10/2022   MPG 111.15 04/10/2022   No results found for: "PROLACTIN" Lab Results  Component Value Date   CHOL 167 11/20/2021   TRIG 107 11/20/2021   HDL 26 (L) 11/20/2021   CHOLHDL 6.4 11/20/2021   VLDL 21 11/20/2021   LDLCALC 120 (H) 11/20/2021    Physical Findings: AIMS: Facial and Oral Movements Muscles of Facial Expression: None,  normal Lips and Perioral Area: None, normal Jaw: None, normal  Tongue: None, normal,Extremity Movements Upper (arms, wrists, hands, fingers): None, normal Lower (legs, knees, ankles, toes): None, normal, Trunk Movements Neck, shoulders, hips: None, normal, Overall Severity Severity of abnormal movements (highest score from questions above): None, normal Incapacitation due to abnormal movements: None, normal Patient's awareness of abnormal movements (rate only patient's report): No Awareness, Dental Status Current problems with teeth and/or dentures?: No Does patient usually wear dentures?: No  CIWA:    COWS:     Musculoskeletal: Strength & Muscle Tone: within normal limits Gait & Station: normal Patient leans: N/A  Psychiatric Specialty Exam:  Presentation  General Appearance:  Appropriate for Environment; Casual; Neat; Well Groomed  Eye Contact: Fair  Speech: Slow  Speech Volume: Decreased  Handedness: Left   Mood and Affect  Mood: Anxious  Affect: Congruent   Thought Process  Thought Processes: Goal Directed  Descriptions of Associations:Circumstantial  Orientation:Partial  Thought Content:Scattered  History of Schizophrenia/Schizoaffective disorder:Yes  Duration of Psychotic Symptoms:Greater than six months  Hallucinations:No data recorded Ideas of Reference:None  Suicidal Thoughts:No data recorded Homicidal Thoughts:No data recorded  Sensorium  Memory: Remote Good  Judgment: Fair  Insight: Fair   Art therapist  Concentration: Fair  Attention Span: Fair  Recall: [employer] of Knowledge: Fair  Language: Fair   Psychomotor Activity  Psychomotor Activity:No data recorded  Assets  Assets:No data recorded  Sleep  Sleep:No data recorded   Physical Exam: Physical Exam Vitals and nursing note reviewed.  Constitutional:      Appearance: Normal appearance.  HENT:     Head: Normocephalic and atraumatic.      Mouth/Throat:     Pharynx: Oropharynx is clear.  Eyes:     Pupils: Pupils are equal, round, and reactive to light.  Cardiovascular:     Rate and Rhythm: Normal rate and regular rhythm.  Pulmonary:     Effort: Pulmonary effort is normal.     Breath sounds: Normal breath sounds.  Abdominal:     General: Abdomen is flat.     Palpations: Abdomen is soft.  Musculoskeletal:        General: Normal range of motion.  Skin:    General: Skin is warm and dry.  Neurological:     General: No focal deficit present.     Mental Status: He is alert. Mental status is at baseline.  Psychiatric:        Attention and Perception: Attention normal.        Mood and Affect: Mood normal. Affect is blunt.        Speech: He is noncommunicative.    Review of Systems  Unable to perform ROS: Language   Blood pressure 102/85, pulse 89, temperature (!) 97.5 F (36.4 C), temperature source Oral, resp. rate 18, height 5\' 8"  (1.727 m), weight 85.8 kg, SpO2 97 %. Body mass index is 28.76 kg/m.   Treatment Plan Summary: Medication management and Plan no change to medication management.  Encouragement and review of the need for finances to be in the line for to get him discharged.  [place], MD 10/16/2022, 11:20 AM

## 2022-10-16 NOTE — Progress Notes (Signed)
Recreation Therapy Notes    Date: 10/16/2022  Time: 10:15 am     Location: Craft room   Behavioral response: N/A   Intervention Topic: Coping Skills    Discussion/Intervention: Patient refused to attend group.   Clinical Observations/Feedback:  Patient refused to attend group.    Desiree Daise LRT/CTRS        Damel Querry 10/16/2022 12:18 PM

## 2022-10-17 NOTE — Progress Notes (Signed)
Patient is still sleeping, so this writer will administer scheduled morning medications once he wakes up. MD will be notified during progression rounds.

## 2022-10-17 NOTE — Progress Notes (Signed)
[facility] MD Progress Note  10/17/2022 3:54 PM Adam [name]  MRN:  [id number] Subjective: Patient has of course no new complaints.  No changes in behavior.  Stable. 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)   Stroke (HCC)   HTN (hypertension)   HLD (hyperlipidemia)   CAD (coronary artery disease)   Chronic systolic CHF (congestive heart failure) (HCC)   Anxiety   Infection of left earlobe  Total Time spent with patient: 30 minutes  Past Psychiatric History: History of schizophrenia and aphasia from stroke  Past Medical History:  Past Medical History:  Diagnosis Date   Myocardial infarction (HCC)    Schizophrenia (HCC)    Stroke (HCC)     Past Surgical History:  Procedure Laterality Date   NO PAST SURGERIES     Family History:  Family History  Problem Relation Age of Onset   Hypertension Mother    Family Psychiatric  History: See previous Social History:  Social History   Substance and Sexual Activity  Alcohol Use Not Currently     Social History   Substance and Sexual Activity  Drug Use Not Currently    Social History   Socioeconomic History   Marital status: Single    Spouse name: Not on file   Number of children: Not on file   Years of education: Not on file   Highest education level: Not on file  Occupational History   Not on file  Tobacco Use   Smoking status: Never    Passive exposure: Never   Smokeless tobacco: Never  Vaping Use   Vaping Use: Unknown  Substance and Sexual Activity   Alcohol use: Not Currently   Drug use: Not Currently   Sexual activity: Not Currently  Other Topics Concern   Not on file  Social History Narrative   Not on file   Social Determinants of Health   Financial Resource Strain: Not on file  Food  Insecurity: Not on file  Transportation Needs: Not on file  Physical Activity: Not on file  Stress: Not on file  Social Connections: Not on file   Additional Social History:  Specify valuables returned:  (none)                      Sleep: Fair  Appetite:  Fair  Current Medications: Current Facility-Administered Medications  Medication Dose Route Frequency Provider Last Rate Last Admin   acetaminophen (TYLENOL) tablet 650 mg  650 mg Oral Q6H PRN Ranard Harte T, MD   650 mg at 10/14/22 2328   alum & mag hydroxide-simeth (MAALOX/MYLANTA) 200-200-20 MG/5ML suspension 30 mL  30 mL Oral Q4H PRN Emmry Hinsch T, MD   30 mL at 04/26/22 0721   amoxicillin-clavulanate (AUGMENTIN) 875-125 MG per tablet 1 tablet  1 tablet Oral Q12H Alford Highland, MD   1 tablet at 10/17/22 1142   aspirin EC tablet 81 mg  81 mg Oral Daily Yilin Weedon T, MD   81 mg at 10/17/22 1142   atorvastatin (LIPITOR) tablet 20 mg  20 mg Oral Daily Pricilla Moehle T, MD   20 mg at 10/17/22 1142   atropine 1 % ophthalmic solution 1 drop  1 drop Sublingual QID [name], MD   1 drop at 10/17/22 1142   benztropine (COGENTIN) tablet 0.5 mg  0.5 mg Oral BID  Kaylena Pacifico, [name], MD   0.5 mg at 10/17/22 1142   clonazePAM (KLONOPIN) tablet 1 mg  1 mg Oral TID PRN He, [name], MD   1 mg at 10/14/22 1105   cloZAPine (CLOZARIL) tablet 300 mg  300 mg Oral QHS Yashvi Jasinski T, MD   300 mg at 10/16/22 2103   diphenhydrAMINE (BENADRYL) capsule 50 mg  50 mg Oral Q6H PRN Arch Methot, [name], MD   50 mg at 09/30/22 2119   Or   diphenhydrAMINE (BENADRYL) injection 50 mg  50 mg Intramuscular Q6H PRN Boen Sterbenz T, MD       doxycycline (VIBRA-TABS) tablet 100 mg  100 mg Oral Q12H Wieting, Richard, MD   100 mg at 10/17/22 1142   gabapentin (NEURONTIN) capsule 300 mg  300 mg Oral TID Ingvald Theisen T, MD   300 mg at 10/17/22 1142   haloperidol (HALDOL) tablet 2 mg  2 mg Oral TID Dezhane Staten T, MD   2 mg at 10/17/22 1142   haloperidol (HALDOL)  tablet 5 mg  5 mg Oral Q6H PRN Catriona Dillenbeck T, MD   5 mg at 09/17/22 1532   Or   haloperidol lactate (HALDOL) injection 5 mg  5 mg Intramuscular Q6H PRN Tylene Quashie T, MD       lisinopril (ZESTRIL) tablet 5 mg  5 mg Oral Daily Mckaylie Vasey T, MD   5 mg at 10/17/22 1143   magnesium hydroxide (MILK OF MAGNESIA) suspension 30 mL  30 mL Oral Daily PRN Adesuwa Osgood T, MD   30 mL at 09/15/22 1047   metoprolol succinate (TOPROL-XL) 24 hr tablet 25 mg  25 mg Oral Daily Kristelle Cavallaro T, MD   25 mg at 10/17/22 1142   ondansetron (ZOFRAN) tablet 4 mg  4 mg Oral Q8H PRN Lerry Liner M, NP   4 mg at 09/06/22 2320   paliperidone (INVEGA) 24 hr tablet 6 mg  6 mg Oral QHS Deno Sida T, MD   6 mg at 10/16/22 2102   senna-docusate (Senokot-S) tablet 2 tablet  2 tablet Oral QHS PRN Sarina Ill, DO   2 tablet at 09/15/22 2110   temazepam (RESTORIL) capsule 15 mg  15 mg Oral QHS Tudor Chandley T, MD   15 mg at 10/16/22 2103   ziprasidone (GEODON) injection 20 mg  20 mg Intramuscular Q12H PRN Shikira Folino, [name], MD   20 mg at 07/08/22 1453    Lab Results: No results found for this or any previous visit (from the past 48 hour(s)).  Blood Alcohol level:  Lab Results  Component Value Date   ETH <10 07/20/2021    Metabolic Disorder Labs: Lab Results  Component Value Date   HGBA1C 5.5 04/10/2022   MPG 111.15 04/10/2022   No results found for: "PROLACTIN" Lab Results  Component Value Date   CHOL 167 11/20/2021   TRIG 107 11/20/2021   HDL 26 (L) 11/20/2021   CHOLHDL 6.4 11/20/2021   VLDL 21 11/20/2021   LDLCALC 120 (H) 11/20/2021    Physical Findings: AIMS: Facial and Oral Movements Muscles of Facial Expression: None, normal Lips and Perioral Area: None, normal Jaw: None, normal Tongue: None, normal,Extremity Movements Upper (arms, wrists, hands, fingers): None, normal Lower (legs, knees, ankles, toes): None, normal, Trunk Movements Neck, shoulders, hips: None, normal, Overall  Severity Severity of abnormal movements (highest score from questions above): None, normal Incapacitation due to abnormal movements: None, normal Patient's awareness of abnormal movements (rate only patient's  report): No Awareness, Dental Status Current problems with teeth and/or dentures?: No Does patient usually wear dentures?: No  CIWA:    COWS:     Musculoskeletal: Strength & Muscle Tone: within normal limits Gait & Station: normal Patient leans: N/A  Psychiatric Specialty Exam:  Presentation  General Appearance:  Appropriate for Environment; Casual; Neat; Well Groomed  Eye Contact: Fair  Speech: Slow  Speech Volume: Decreased  Handedness: Left   Mood and Affect  Mood: Anxious  Affect: Congruent   Thought Process  Thought Processes: Goal Directed  Descriptions of Associations:Circumstantial  Orientation:Partial  Thought Content:Scattered  History of Schizophrenia/Schizoaffective disorder:Yes  Duration of Psychotic Symptoms:Greater than six months  Hallucinations:No data recorded Ideas of Reference:None  Suicidal Thoughts:No data recorded Homicidal Thoughts:No data recorded  Sensorium  Memory: Remote Good  Judgment: Fair  Insight: Fair   Art therapist  Concentration: Fair  Attention Span: Fair  Recall: [employer] of Knowledge: Fair  Language: Fair   Psychomotor Activity  Psychomotor Activity:No data recorded  Assets  Assets:No data recorded  Sleep  Sleep:No data recorded   Physical Exam: Physical Exam Vitals and nursing note reviewed.  Constitutional:      Appearance: Normal appearance.  HENT:     Head: Normocephalic and atraumatic.     Mouth/Throat:     Pharynx: Oropharynx is clear.  Eyes:     Pupils: Pupils are equal, round, and reactive to light.  Cardiovascular:     Rate and Rhythm: Normal rate and regular rhythm.  Pulmonary:     Effort: Pulmonary effort is normal.     Breath sounds: Normal  breath sounds.  Abdominal:     General: Abdomen is flat.     Palpations: Abdomen is soft.  Musculoskeletal:        General: Normal range of motion.  Skin:    General: Skin is warm and dry.  Neurological:     General: No focal deficit present.     Mental Status: He is alert. Mental status is at baseline.  Psychiatric:        Attention and Perception: Attention normal.        Mood and Affect: Mood normal.        Speech: Speech normal.        Behavior: Behavior normal.        Thought Content: Thought content normal.        Cognition and Memory: Cognition normal.    Review of Systems  Unable to perform ROS: Language   Blood pressure 115/79, pulse 90, temperature 97.9 F (36.6 C), temperature source Oral, resp. rate 18, height 5\' 8"  (1.727 m), weight 85.8 kg, SpO2 98 %. Body mass index is 28.76 kg/m.   Treatment Plan Summary: Medication management and Plan no change to medication management.  Still waiting on finances to come through for placement  , MD 10/17/2022, 3:54 PM

## 2022-10-17 NOTE — BHH Counselor (Signed)
CSW attended meeting with [facility] representatives ([place] and [place]), TOC Supervisor [place], [facility] Director Zack to discuss the patient's potential placement.  It was updated that this Training and development officer both have reached out to Ms. Annice Pih and Tammy with the group home with not much luck.  CSW updated that she has left VM and text messages.  It was suggested that Plan B begin being explored though nothing definitive was decided.  TOC Direct Zack reports that he will stop by the potential group home to increase communication and ease any concerns.   Linsey reported that at this time the group home has not  completed or turned in the Single Case Agreement form.  Once hte form has been received, [facility] can approve or deny the claim and if approved conversation can be had about funding.  It was reported that the group home wants to discuss funding currently.  Address of the home is [address] [place], [place].  [name], MSW, LCSW 10/17/2022 10:38 AM

## 2022-10-17 NOTE — Plan of Care (Signed)
D- Patient alert and oriented to person, place, and situation. Patient presents in a preoccupied, but pleasant mood on assessment stating that he slept ok last night and had no complaints to voice to this Clinical research associate. Patient is childlike and due to a past stroke, he has expressive aphasia. Patient was fixated on talking to his Mom, so this Clinical research associate called her and transferred he call over to the patient phones. Patient likes to call her multiple times throughout the day. Patient denies SI, HI, AVH, and pain at this time. Patient also denies any signs/symptoms of depression/anxiety by shaking his head and saying "no". Patient had no stated goals for today.  A- Scheduled medications administered to patient, per MD orders. Support and encouragement provided.  Routine safety checks conducted every 15 minutes.  Patient informed to notify staff with problems or concerns.  R- No adverse drug reactions noted. Patient contracts for safety at this time. Patient compliant with medications and treatment plan. Patient receptive, calm, and cooperative. Patient interacts well with others on the unit. Patient remains safe at this time.  Problem: Education: Goal: Ability to state activities that reduce stress will improve Outcome: Progressing   Problem: Coping: Goal: Ability to identify and develop effective coping behavior will improve Outcome: Progressing   Problem: Self-Concept: Goal: Ability to identify factors that promote anxiety will improve Outcome: Progressing Goal: Level of anxiety will decrease Outcome: Progressing Goal: Ability to modify response to factors that promote anxiety will improve Outcome: Progressing   Problem: Education: Goal: Knowledge of General Education information will improve Description: Including pain rating scale, medication(s)/side effects and non-pharmacologic comfort measures Outcome: Progressing   Problem: Health Behavior/Discharge Planning: Goal: Ability to manage  health-related needs will improve Outcome: Progressing   Problem: Clinical Measurements: Goal: Ability to maintain clinical measurements within normal limits will improve Outcome: Progressing Goal: Will remain free from infection Outcome: Progressing Goal: Diagnostic test results will improve Outcome: Progressing Goal: Respiratory complications will improve Outcome: Progressing Goal: Cardiovascular complication will be avoided Outcome: Progressing   Problem: Activity: Goal: Risk for activity intolerance will decrease Outcome: Progressing   Problem: Nutrition: Goal: Adequate nutrition will be maintained Outcome: Progressing   Problem: Coping: Goal: Level of anxiety will decrease Outcome: Progressing   Problem: Elimination: Goal: Will not experience complications related to bowel motility Outcome: Progressing Goal: Will not experience complications related to urinary retention Outcome: Progressing   Problem: Pain Managment: Goal: General experience of comfort will improve Outcome: Progressing   Problem: Safety: Goal: Ability to remain free from injury will improve Outcome: Progressing   Problem: Skin Integrity: Goal: Risk for impaired skin integrity will decrease Outcome: Progressing   Problem: Activity: Goal: Interest or engagement in leisure activities will improve Outcome: Progressing   Problem: Coping: Goal: Coping ability will improve Outcome: Progressing   Problem: Education: Goal: Will be free of psychotic symptoms Outcome: Progressing

## 2022-10-17 NOTE — [facility] Group Notes (Signed)
[facility] Group Notes:  (Nursing/MHT/Case Management/Adjunct)  Date:  10/17/2022  Time:  9:01 PM  Type of Therapy:   Wrap up  Participation Level:  Active  Participation Quality:  Appropriate  Affect:  Appropriate  Cognitive:  Alert  Insight:  Good  Engagement in Group:  Engaged and he said his name.  Modes of Intervention:  Support  Summary of Progress/Problems:  Adam [name] 10/17/2022, 9:01 PM

## 2022-10-17 NOTE — Progress Notes (Signed)
Recreation Therapy Notes  Date: 10/17/2022  Time: 10:45 am    Location: Craft room   Behavioral response: Appropriate   Intervention Topic:  Self-esteem     Discussion/Intervention:  Group content today was focused on self-esteem. Patient defined self-esteem and where it comes form. The group described reasons self-esteem is important. Individuals stated things that impact self-esteem and positive ways to improve self-esteem. The group participated in the intervention "Collage of Me" where patients were able to create a collage of positive things that makes them who they are. Clinical Observations/Feedback: Patient came to group late and was focused on what peers and staff had to say about self-esteem. Individual was social with peers and staff while participating in the intervention.     Devinne Epstein LRT/CTRS         Sherrell Weir 10/17/2022 1:44 PM

## 2022-10-17 NOTE — Progress Notes (Signed)
Patient calm and cooperative during assessment denying SI/HI/AVH. Patient observed interacting appropriately with staff and peers on the unit. Patient compliant with medication administration per MD orders. Pt being monitored Q 15 minutes for safety per unit protocol, remains safe on the unit  

## 2022-10-17 NOTE — Progress Notes (Signed)
Patient calm and cooperative during assessment denying SI/HI/AVH. Patient observed interacting appropriately with staff and peers on the unit. Patient compliant with medication administration per MD orders. Pt being monitored Q 15 minutes for safety per unit protocol, remains safe on the unit

## 2022-10-18 NOTE — Progress Notes (Signed)
[facility] MD Progress Note  10/18/2022 11:56 AM Adam [name]  MRN:  [id number] Subjective: Follow-up patient with schizophrenia.  Today when approached Adam [name] would only say "bad".  We have found that he does this from time to time it is hard to tell what puts him in a bad mood.  He is not acting out aggressively and from everything I have been told has not been refusing medication.  Seems to be in reasonable state of health. 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)   Stroke (HCC)   HTN (hypertension)   HLD (hyperlipidemia)   CAD (coronary artery disease)   Chronic systolic CHF (congestive heart failure) (HCC)   Anxiety   Infection of left earlobe  Total Time spent with patient: 20 minutes  Past Psychiatric History: Long history of schizophrenia and stroke  Past Medical History:  Past Medical History:  Diagnosis Date   Myocardial infarction (HCC)    Schizophrenia (HCC)    Stroke (HCC)     Past Surgical History:  Procedure Laterality Date   NO PAST SURGERIES     Family History:  Family History  Problem Relation Age of Onset   Hypertension Mother    Family Psychiatric  History: See previous Social History:  Social History   Substance and Sexual Activity  Alcohol Use Not Currently     Social History   Substance and Sexual Activity  Drug Use Not Currently    Social History   Socioeconomic History   Marital status: Single    Spouse name: Not on file   Number of children: Not on file   Years of education: Not on file   Highest education level: Not on file  Occupational History   Not on file  Tobacco Use   Smoking status: Never    Passive exposure: Never   Smokeless tobacco: Never  Vaping Use   Vaping Use: Unknown  Substance and Sexual Activity    Alcohol use: Not Currently   Drug use: Not Currently   Sexual activity: Not Currently  Other Topics Concern   Not on file  Social History Narrative   Not on file   Social Determinants of Health   Financial Resource Strain: Not on file  Food Insecurity: Not on file  Transportation Needs: Not on file  Physical Activity: Not on file  Stress: Not on file  Social Connections: Not on file   Additional Social History:  Specify valuables returned:  (none)                      Sleep: Fair  Appetite:  Fair  Current Medications: Current Facility-Administered Medications  Medication Dose Route Frequency Provider Last Rate Last Admin   acetaminophen (TYLENOL) tablet 650 mg  650 mg Oral Q6H PRN Charnee Turnipseed T, MD   650 mg at 10/14/22 2328   alum & mag hydroxide-simeth (MAALOX/MYLANTA) 200-200-20 MG/5ML suspension 30 mL  30 mL Oral Q4H PRN Jensen Kilburg T, MD   30 mL at 04/26/22 0721   amoxicillin-clavulanate (AUGMENTIN) 875-125 MG per tablet 1 tablet  1 tablet Oral Q12H Alford Highland, MD   1 tablet at 10/18/22 1107   aspirin EC tablet 81 mg  81 mg Oral Daily Nylan Nevel T, MD   81 mg at 10/18/22 1106   atorvastatin (LIPITOR) tablet 20 mg  20  mg Oral Daily Tynan Boesel T, MD   20 mg at 10/18/22 1106   atropine 1 % ophthalmic solution 1 drop  1 drop Sublingual QID [name], MD   1 drop at 10/18/22 1110   benztropine (COGENTIN) tablet 0.5 mg  0.5 mg Oral BID Markiya Keefe T, MD   0.5 mg at 10/18/22 1106   clonazePAM (KLONOPIN) tablet 1 mg  1 mg Oral TID PRN He, [name], MD   1 mg at 10/14/22 1105   cloZAPine (CLOZARIL) tablet 300 mg  300 mg Oral QHS Nicasio Barlowe T, MD   300 mg at 10/17/22 2118   diphenhydrAMINE (BENADRYL) capsule 50 mg  50 mg Oral Q6H PRN Maira Christon T, MD   50 mg at 09/30/22 2119   Or   diphenhydrAMINE (BENADRYL) injection 50 mg  50 mg Intramuscular Q6H PRN Kassie Keng T, MD       doxycycline (VIBRA-TABS) tablet 100 mg  100 mg Oral Q12H Wieting,  Richard, MD   100 mg at 10/18/22 1106   gabapentin (NEURONTIN) capsule 300 mg  300 mg Oral TID Teigen Parslow T, MD   300 mg at 10/18/22 1106   haloperidol (HALDOL) tablet 2 mg  2 mg Oral TID Kaeley Vinje T, MD   2 mg at 10/18/22 1106   haloperidol (HALDOL) tablet 5 mg  5 mg Oral Q6H PRN Courtland Coppa T, MD   5 mg at 09/17/22 1532   Or   haloperidol lactate (HALDOL) injection 5 mg  5 mg Intramuscular Q6H PRN Ahmon Tosi T, MD       lisinopril (ZESTRIL) tablet 5 mg  5 mg Oral Daily Rileigh Kawashima T, MD   5 mg at 10/18/22 1106   magnesium hydroxide (MILK OF MAGNESIA) suspension 30 mL  30 mL Oral Daily PRN Delesa Kawa T, MD   30 mL at 09/15/22 1047   metoprolol succinate (TOPROL-XL) 24 hr tablet 25 mg  25 mg Oral Daily Raelee Rossmann T, MD   25 mg at 10/18/22 1106   ondansetron (ZOFRAN) tablet 4 mg  4 mg Oral Q8H PRN [name], NP   4 mg at 09/06/22 2320   paliperidone (INVEGA) 24 hr tablet 6 mg  6 mg Oral QHS Bladen Umar T, MD   6 mg at 10/17/22 2118   senna-docusate (Senokot-S) tablet 2 tablet  2 tablet Oral QHS PRN [name], DO   2 tablet at 09/15/22 2110   temazepam (RESTORIL) capsule 15 mg  15 mg Oral QHS Cypher Paule T, MD   15 mg at 10/17/22 2118   ziprasidone (GEODON) injection 20 mg  20 mg Intramuscular Q12H PRN Azlyn Wingler, [name], MD   20 mg at 07/08/22 1453    Lab Results: No results found for this or any previous visit (from the past 54 hour(s)).  Blood Alcohol level:  Lab Results  Component Value Date   ETH <10 [id number]    Metabolic Disorder Labs: Lab Results  Component Value Date   HGBA1C 5.5 04/10/2022   MPG 111.15 04/10/2022   No results found for: "PROLACTIN" Lab Results  Component Value Date   CHOL 167 11/20/2021   TRIG 107 11/20/2021   HDL 26 (L) 11/20/2021   CHOLHDL 6.4 11/20/2021   VLDL 21 11/20/2021   LDLCALC 120 (H) 11/20/2021    Physical Findings: AIMS: Facial and Oral Movements Muscles of Facial Expression: None, normal Lips  and Perioral Area: None, normal Jaw: None, normal Tongue: None, normal,Extremity  Movements Upper (arms, wrists, hands, fingers): None, normal Lower (legs, knees, ankles, toes): None, normal, Trunk Movements Neck, shoulders, hips: None, normal, Overall Severity Severity of abnormal movements (highest score from questions above): None, normal Incapacitation due to abnormal movements: None, normal Patient's awareness of abnormal movements (rate only patient's report): No Awareness, Dental Status Current problems with teeth and/or dentures?: No Does patient usually wear dentures?: No  CIWA:    COWS:     Musculoskeletal: Strength & Muscle Tone: within normal limits Gait & Station: normal Patient leans: N/A  Psychiatric Specialty Exam:  Presentation  General Appearance:  Appropriate for Environment; Casual; Neat; Well Groomed  Eye Contact: Fair  Speech: Slow  Speech Volume: Decreased  Handedness: Left   Mood and Affect  Mood: Anxious  Affect: Congruent   Thought Process  Thought Processes: Goal Directed  Descriptions of Associations:Circumstantial  Orientation:Partial  Thought Content:Scattered  History of Schizophrenia/Schizoaffective disorder:Yes  Duration of Psychotic Symptoms:Greater than six months  Hallucinations:No data recorded Ideas of Reference:None  Suicidal Thoughts:No data recorded Homicidal Thoughts:No data recorded  Sensorium  Memory: Remote Good  Judgment: Fair  Insight: Fair   Community education officer  Concentration: Fair  Attention Span: Fair  Recall: [employer] of Knowledge: Fair  Language: Fair   Psychomotor Activity  Psychomotor Activity:No data recorded  Assets  Assets:No data recorded  Sleep  Sleep:No data recorded   Physical Exam: Physical Exam Vitals and nursing note reviewed.  Constitutional:      Appearance: Normal appearance.  HENT:     Head: Normocephalic and atraumatic.     Mouth/Throat:      Pharynx: Oropharynx is clear.  Eyes:     Pupils: Pupils are equal, round, and reactive to light.  Cardiovascular:     Rate and Rhythm: Normal rate and regular rhythm.  Pulmonary:     Effort: Pulmonary effort is normal.     Breath sounds: Normal breath sounds.  Abdominal:     General: Abdomen is flat.     Palpations: Abdomen is soft.  Musculoskeletal:        General: Normal range of motion.  Skin:    General: Skin is warm and dry.  Neurological:     General: No focal deficit present.     Mental Status: He is alert. Mental status is at baseline.  Psychiatric:        Mood and Affect: Mood normal.        Thought Content: Thought content normal.    Review of Systems  Unable to perform ROS: Language   Blood pressure 101/79, pulse 96, temperature (!) 97.3 F (36.3 C), temperature source Oral, resp. rate 20, height 5\' 8"  (1.727 m), weight 85.8 kg, SpO2 99 %. Body mass index is 28.76 kg/m.   Treatment Plan Summary: Plan no change to medication.  Offered empathy and encouragement and reminded patient that he can try to communicate with [place] if there is anything particular that is bothering him or that he needs or just wants to communicate.  Still entirely we are focused on discharge.  Alethia Berthold, MD 10/18/2022, 11:56 AM

## 2022-10-18 NOTE — Plan of Care (Signed)
D: Patient alert and oriented. Patient denies pain. Patient denies anxiety and depression. Patient denies SI/HI/AVH.  A: Scheduled medications administered to patient, per MD orders.  Support and encouragement provided to patient.  Q15 minute safety checks maintained.   R: Patient compliant with medication administration and treatment plan. No adverse drug reactions noted. Patient remains safe on the unit at this time. Problem: Education: Goal: Knowledge of General Education information will improve Description: Including pain rating scale, medication(s)/side effects and non-pharmacologic comfort measures Outcome: Progressing   Problem: Clinical Measurements: Goal: Ability to maintain clinical measurements within normal limits will improve Outcome: Progressing Goal: Will remain free from infection Outcome: Progressing   Problem: Activity: Goal: Interest or engagement in leisure activities will improve Outcome: Progressing

## 2022-10-18 NOTE — Progress Notes (Signed)
Patient calm and cooperative during assessment denying SI/HI/AVH. Patient observed interacting appropriately with staff and peers on the unit. Patient compliant with medication administration per MD orders. Pt being monitored Q 15 minutes for safety per unit protocol, remains safe on the unit  

## 2022-10-18 NOTE — Progress Notes (Signed)
Recreation Therapy Notes    Date: 10/18/2022   Time: 2:05pm   Location: Courtyard    Behavioral response: Appropriate   Group Type: Recreation and Leisure   Participation level: Active   Communication: Patient was social with peers and staff.   Comments: N/A   [name] LRT/CTRS          Adam [name] 10/18/2022 3:08 PM 

## 2022-10-18 NOTE — Progress Notes (Signed)
Recreation Therapy Notes  Date: 10/18/2022  Time: 10:35 am     Location: Craft room   Behavioral response: N/A   Intervention Topic: Happiness    Discussion/Intervention: Patient refused to attend group.   Clinical Observations/Feedback:  Patient refused to attend group.    Leonides Minder LRT/CTRS        Myrle Dues 10/18/2022 12:35 PM

## 2022-10-19 LAB — CBC WITH DIFFERENTIAL/PLATELET
Abs Immature Granulocytes: 0.03 10*3/uL (ref 0.00–0.07)
Basophils Absolute: 0.1 10*3/uL (ref 0.0–0.1)
Basophils Relative: 2 %
Eosinophils Absolute: 0 10*3/uL (ref 0.0–0.5)
Eosinophils Relative: 0 %
HCT: 36.1 % — ABNORMAL LOW (ref 39.0–52.0)
Hemoglobin: 12.6 g/dL — ABNORMAL LOW (ref 13.0–17.0)
Immature Granulocytes: 1 %
Lymphocytes Relative: 20 %
Lymphs Abs: 0.9 10*3/uL (ref 0.7–4.0)
MCH: 31 pg (ref 26.0–34.0)
MCHC: 34.9 g/dL (ref 30.0–36.0)
MCV: 88.9 fL (ref 80.0–100.0)
Monocytes Absolute: 0.4 10*3/uL (ref 0.1–1.0)
Monocytes Relative: 9 %
Neutro Abs: 3.1 10*3/uL (ref 1.7–7.7)
Neutrophils Relative %: 68 %
Platelets: 184 10*3/uL (ref 150–400)
RBC: 4.06 MIL/uL — ABNORMAL LOW (ref 4.22–5.81)
RDW: 13.2 % (ref 11.5–15.5)
WBC: 4.6 10*3/uL (ref 4.0–10.5)
nRBC: 0 % (ref 0.0–0.2)

## 2022-10-19 NOTE — [facility] Counselor (Signed)
CSW spoke with Miss Annice Pih with [facility].  Miss Annice Pih confirmed that they continue to hold pt's bed for placement.  She reports that to her knowledge the Single Case Agreement form has not been completed nor turned in.  She was unable to identify any barriers as she has done "what I can on the form" the rest is for the group home owner to complete.   She reports that she will follow up with this writer once she speaks to the group owner and has more insight.   She has requested another copy of pt's FL2.  CSW has provided a copy.  [name], MSW, LCSW 10/19/2022 4:02 PM

## 2022-10-19 NOTE — Progress Notes (Signed)
[facility] MD Progress Note  10/19/2022 11:22 AM Adam [name]  MRN:  [id number] Subjective: Patient seen and chart reviewed.  Patient indicated no complaint.  Seems to be in a generally euthymic state of mood.  Behavior no different than normal.  No sign of aggression or bizarre behavior. 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)   Stroke (HCC)   HTN (hypertension)   HLD (hyperlipidemia)   CAD (coronary artery disease)   Chronic systolic CHF (congestive heart failure) (HCC)   Anxiety   Infection of left earlobe  Total Time spent with patient: 20 minutes  Past Psychiatric History: Past history of schizophrenia and a history of aphasia presumed to be related to stroke  Past Medical History:  Past Medical History:  Diagnosis Date   Myocardial infarction (HCC)    Schizophrenia (HCC)    Stroke (HCC)     Past Surgical History:  Procedure Laterality Date   NO PAST SURGERIES     Family History:  Family History  Problem Relation Age of Onset   Hypertension Mother    Family Psychiatric  History: None reported Social History:  Social History   Substance and Sexual Activity  Alcohol Use Not Currently     Social History   Substance and Sexual Activity  Drug Use Not Currently    Social History   Socioeconomic History   Marital status: Single    Spouse name: Not on file   Number of children: Not on file   Years of education: Not on file   Highest education level: Not on file  Occupational History   Not on file  Tobacco Use   Smoking status: Never    Passive exposure: Never   Smokeless tobacco: Never  Vaping Use   Vaping Use: Unknown  Substance and Sexual Activity   Alcohol use: Not Currently   Drug use: Not Currently   Sexual activity: Not Currently   Other Topics Concern   Not on file  Social History Narrative   Not on file   Social Determinants of Health   Financial Resource Strain: Not on file  Food Insecurity: Not on file  Transportation Needs: Not on file  Physical Activity: Not on file  Stress: Not on file  Social Connections: Not on file   Additional Social History:  Specify valuables returned:  (none)                      Sleep: Fair  Appetite:  Fair  Current Medications: Current Facility-Administered Medications  Medication Dose Route Frequency Provider Last Rate Last Admin   acetaminophen (TYLENOL) tablet 650 mg  650 mg Oral Q6H PRN Kaliya Shreiner T, MD   650 mg at 10/14/22 2328   alum & mag hydroxide-simeth (MAALOX/MYLANTA) 200-200-20 MG/5ML suspension 30 mL  30 mL Oral Q4H PRN Chaneka Trefz T, MD   30 mL at 04/26/22 0721   amoxicillin-clavulanate (AUGMENTIN) 875-125 MG per tablet 1 tablet  1 tablet Oral Q12H Alford Highland, MD   1 tablet at 10/19/22 7829   aspirin EC tablet 81 mg  81 mg Oral Daily Lakshmi Sundeen, [name], MD   81 mg at 10/19/22 0814   atorvastatin (LIPITOR) tablet 20 mg  20 mg Oral Daily Belvie Iribe, [name], MD   20 mg at 10/19/22 0814   atropine 1 % ophthalmic solution  1 drop  1 drop Sublingual QID [name], MD   1 drop at 10/19/22 0814   benztropine (COGENTIN) tablet 0.5 mg  0.5 mg Oral BID Jataya Wann T, MD   0.5 mg at 10/19/22 0813   clonazePAM (KLONOPIN) tablet 1 mg  1 mg Oral TID PRN He, [name], MD   1 mg at 10/14/22 1105   cloZAPine (CLOZARIL) tablet 300 mg  300 mg Oral QHS Lavine Hargrove T, MD   300 mg at 10/18/22 2112   diphenhydrAMINE (BENADRYL) capsule 50 mg  50 mg Oral Q6H PRN Jaymi Tinner, [name], MD   50 mg at 09/30/22 2119   Or   diphenhydrAMINE (BENADRYL) injection 50 mg  50 mg Intramuscular Q6H PRN Trudee Chirino T, MD       doxycycline (VIBRA-TABS) tablet 100 mg  100 mg Oral Q12H Wieting, Richard, MD   100 mg at 10/19/22 5093   gabapentin (NEURONTIN) capsule 300 mg  300 mg Oral TID  Vern Prestia T, MD   300 mg at 10/19/22 0900   haloperidol (HALDOL) tablet 2 mg  2 mg Oral TID Aldean Pipe T, MD   2 mg at 10/19/22 0900   haloperidol (HALDOL) tablet 5 mg  5 mg Oral Q6H PRN Avalina Benko T, MD   5 mg at 09/17/22 1532   Or   haloperidol lactate (HALDOL) injection 5 mg  5 mg Intramuscular Q6H PRN [place] Nolton T, MD       lisinopril (ZESTRIL) tablet 5 mg  5 mg Oral Daily Zalma Channing T, MD   5 mg at 10/19/22 2671   magnesium hydroxide (MILK OF MAGNESIA) suspension 30 mL  30 mL Oral Daily PRN Soley Harriss T, MD   30 mL at 09/15/22 1047   metoprolol succinate (TOPROL-XL) 24 hr tablet 25 mg  25 mg Oral Daily Cyniah Gossard T, MD   25 mg at 10/19/22 0813   ondansetron (ZOFRAN) tablet 4 mg  4 mg Oral Q8H PRN Lerry Liner M, NP   4 mg at 09/06/22 2320   paliperidone (INVEGA) 24 hr tablet 6 mg  6 mg Oral QHS Petrona Wyeth T, MD   6 mg at 10/18/22 2112   senna-docusate (Senokot-S) tablet 2 tablet  2 tablet Oral QHS PRN Sarina Ill, DO   2 tablet at 09/15/22 2110   temazepam (RESTORIL) capsule 15 mg  15 mg Oral QHS Gianny Sabino T, MD   15 mg at 10/18/22 2112   ziprasidone (GEODON) injection 20 mg  20 mg Intramuscular Q12H PRN Tatym Schermer, [name], MD   20 mg at 07/08/22 1453    Lab Results: No results found for this or any previous visit (from the past 48 hour(s)).  Blood Alcohol level:  Lab Results  Component Value Date   ETH <10 07/20/2021    Metabolic Disorder Labs: Lab Results  Component Value Date   HGBA1C 5.5 04/10/2022   MPG 111.15 04/10/2022   No results found for: "PROLACTIN" Lab Results  Component Value Date   CHOL 167 11/20/2021   TRIG 107 11/20/2021   HDL 26 (L) 11/20/2021   CHOLHDL 6.4 11/20/2021   VLDL 21 11/20/2021   LDLCALC 120 (H) 11/20/2021    Physical Findings: AIMS: Facial and Oral Movements Muscles of Facial Expression: None, normal Lips and Perioral Area: None, normal Jaw: None, normal Tongue: None, normal,Extremity  Movements Upper (arms, wrists, hands, fingers): None, normal Lower (legs, knees, ankles, toes): None, normal, Trunk Movements Neck, shoulders, hips: None,  normal, Overall Severity Severity of abnormal movements (highest score from questions above): None, normal Incapacitation due to abnormal movements: None, normal Patient's awareness of abnormal movements (rate only patient's report): No Awareness, Dental Status Current problems with teeth and/or dentures?: No Does patient usually wear dentures?: No  CIWA:    COWS:     Musculoskeletal: Strength & Muscle Tone: within normal limits Gait & Station: normal Patient leans: N/A  Psychiatric Specialty Exam:  Presentation  General Appearance:  Appropriate for Environment; Casual; Neat; Well Groomed  Eye Contact: Fair  Speech: Slow  Speech Volume: Decreased  Handedness: Left   Mood and Affect  Mood: Anxious  Affect: Congruent   Thought Process  Thought Processes: Goal Directed  Descriptions of Associations:Circumstantial  Orientation:Partial  Thought Content:Scattered  History of Schizophrenia/Schizoaffective disorder:Yes  Duration of Psychotic Symptoms:Greater than six months  Hallucinations:No data recorded Ideas of Reference:None  Suicidal Thoughts:No data recorded Homicidal Thoughts:No data recorded  Sensorium  Memory: Remote Good  Judgment: Fair  Insight: Fair   Art therapist  Concentration: Fair  Attention Span: Fair  Recall: [employer] of Knowledge: Fair  Language: Fair   Psychomotor Activity  Psychomotor Activity:No data recorded  Assets  Assets:No data recorded  Sleep  Sleep:No data recorded   Physical Exam: Physical Exam Vitals and nursing note reviewed.  Constitutional:      Appearance: Normal appearance.  HENT:     Head: Normocephalic and atraumatic.     Mouth/Throat:     Pharynx: Oropharynx is clear.  Eyes:     Pupils: Pupils are equal, round,  and reactive to light.  Cardiovascular:     Rate and Rhythm: Normal rate and regular rhythm.  Pulmonary:     Effort: Pulmonary effort is normal.     Breath sounds: Normal breath sounds.  Abdominal:     General: Abdomen is flat.     Palpations: Abdomen is soft.  Musculoskeletal:        General: Normal range of motion.  Skin:    General: Skin is warm and dry.  Neurological:     General: No focal deficit present.     Mental Status: He is alert. Mental status is at baseline.  Psychiatric:        Mood and Affect: Mood normal.        Thought Content: Thought content normal.    Review of Systems  Unable to perform ROS: Language   Blood pressure 103/71, pulse 88, temperature 97.7 F (36.5 C), temperature source Oral, resp. rate 18, height 5\' 8"  (1.727 m), weight 85.8 kg, SpO2 100 %. Body mass index is 28.76 kg/m.   Treatment Plan Summary: Medication management and Plan patient appears to be very stable.  No change in clinical presentation.  Encourage patient's.  Encourage group attendance.  Case discussed daily with treatment team.  We are only waiting on some kind of breakthrough to allow for placement.  , MD 10/19/2022, 11:22 AM

## 2022-10-19 NOTE — Progress Notes (Signed)
Pharmacy - Clozapine     This patient's order has been reviewed for prescribing contraindications.   Clozapine REMS enrollment Verified: yes REMS number: [id number] Enrollment date: 01/04/2022 Current Outpatient Monitoring: every week  Dose Adjustments This Admission: Clozapine 300mg  qhs since 03/19/2022  Date ANC  7/26  2500 8/02   2200 8/21   2500 9/19   2451 10/3  3100 10/17 3800 11/01 3300 11/09 2700 11/17 3100  Plan: Lab submitted to clozapine REMS Continue ANC labs every week while inpatient   12/17, PharmD Clinical Pharmacist 10/19/2022 1:57 PM

## 2022-10-19 NOTE — Plan of Care (Signed)
D- Patient alert and oriented to person, place, and situation. Patient initially presented in an irritable mood on assessment because he wanted the remote to this TV, in the dayroom, and this writer tried to explain to patient that staff had to keep it and that he could not have it. He yelled out a few times and only took some of his medication, but after talking to his Mother, his spirits lifted and he became much more pleasant. Patient is childlike and due to a past stroke, he has expressive aphasia. Patient did fill out his self-inventory this morning and reported that he slept good last night and had no complaints to voice to this Clinical research associate. Although patient checked that he is having suicidal thoughts, he denies SI, HI, AVH, and pain to this Clinical research associate. Patient also denied any signs/symptoms of depression and anxiety. Patient had no stated goals for today. Patient did take a shower and washed his clothes this morning.  A- Scheduled medications administered to patient, per MD orders. Support and encouragement provided.  Routine safety checks conducted every 15 minutes.  Patient informed to notify staff with problems or concerns.  R- No adverse drug reactions noted. Patient contracts for safety at this time. Patient compliant with medications and treatment plan. Patient receptive, calm, and cooperative. Patient interacts well with others on the unit.  Patient remains safe at this time.  Problem: Education: Goal: Ability to state activities that reduce stress will improve Outcome: Progressing   Problem: Coping: Goal: Ability to identify and develop effective coping behavior will improve Outcome: Progressing   Problem: Self-Concept: Goal: Ability to identify factors that promote anxiety will improve Outcome: Progressing Goal: Level of anxiety will decrease Outcome: Progressing Goal: Ability to modify response to factors that promote anxiety will improve Outcome: Progressing   Problem:  Education: Goal: Knowledge of General Education information will improve Description: Including pain rating scale, medication(s)/side effects and non-pharmacologic comfort measures Outcome: Progressing   Problem: Health Behavior/Discharge Planning: Goal: Ability to manage health-related needs will improve Outcome: Progressing   Problem: Clinical Measurements: Goal: Ability to maintain clinical measurements within normal limits will improve Outcome: Progressing Goal: Will remain free from infection Outcome: Progressing Goal: Diagnostic test results will improve Outcome: Progressing Goal: Respiratory complications will improve Outcome: Progressing Goal: Cardiovascular complication will be avoided Outcome: Progressing   Problem: Activity: Goal: Risk for activity intolerance will decrease Outcome: Progressing   Problem: Nutrition: Goal: Adequate nutrition will be maintained Outcome: Progressing   Problem: Coping: Goal: Level of anxiety will decrease Outcome: Progressing   Problem: Elimination: Goal: Will not experience complications related to bowel motility Outcome: Progressing Goal: Will not experience complications related to urinary retention Outcome: Progressing   Problem: Pain Managment: Goal: General experience of comfort will improve Outcome: Progressing   Problem: Safety: Goal: Ability to remain free from injury will improve Outcome: Progressing   Problem: Skin Integrity: Goal: Risk for impaired skin integrity will decrease Outcome: Progressing   Problem: Activity: Goal: Interest or engagement in leisure activities will improve Outcome: Progressing   Problem: Coping: Goal: Coping ability will improve Outcome: Progressing   Problem: Education: Goal: Will be free of psychotic symptoms Outcome: Progressing

## 2022-10-19 NOTE — NC FL2 (Signed)
[place] [facility] [place] MEDICAID FL2 LEVEL OF CARE SCREENING TOOL     IDENTIFICATION  Patient Name: Adam [name] Birthdate: Jan 30, 1961 Sex: male Admission Date (Current Location): 03/19/2022  [facility] and [place] Number:  Chiropodist and Address:  [facility], [address], [place], [place] [address]      Provider Number: [id number]  Attending Physician Name and Address:  Audery Amel, MD  Relative Name and Phone Number:  [place], [place] (mother) [phone] (mobile)    Current Level of Care: Hospital Recommended Level of Care: Assisted Living Facility, Family Care Home, Other (Comment) (Group Home) Prior Approval Number:    Date Approved/Denied:   PASRR Number:    Discharge Plan: Other (Comment) (ALF, [facility], Group home)    Current Diagnoses: Patient Active Problem List   Diagnosis Date Noted   Infection of left earlobe 10/14/2022   HTN (hypertension) 10/05/2022   HLD (hyperlipidemia) 10/05/2022   CAD (coronary artery disease) 10/05/2022   Chronic systolic CHF (congestive heart failure) (HCC) 10/05/2022   Anxiety 10/05/2022   Stroke (HCC) 11/27/2021   NSTEMI (non-ST elevated myocardial infarction) (HCC) 11/21/2021   DNR (do not resuscitate)/DNI(Do Not Intubate) 11/20/2021   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) 07/25/2021   Schizophrenia, chronic condition with acute exacerbation (HCC) 07/20/2021    Orientation RESPIRATION BLADDER Height & Weight     Self, Situation  Normal Continent Weight: 189 lb 2.5 oz (85.8 kg) Height:  5\' 8"  (172.7 cm)  BEHAVIORAL SYMPTOMS/MOOD NEUROLOGICAL BOWEL NUTRITION STATUS   (Cooperative)  (TBI) Continent Diet (Regular)  AMBULATORY STATUS COMMUNICATION OF NEEDS Skin   Independent Verbally Normal                       Personal Care Assistance Level of Assistance  Bathing (Patient will ask for assistance with shaving.) Bathing Assistance: Independent Feeding assistance:  Independent Dressing Assistance: Independent Total Care Assistance: Independent   Functional Limitations Info  Speech  Body (Some right weakness following a stroke, however, does not impair patient from completing tasks, he reaches and uses his left side more.) Sight Info: Impaired (Patient wears glasses.) Hearing Info: Adequate Speech Info: Impaired (Patient has aphasia, speech is limited.  Patient understands directions and can follow them but has some difficulty in processing and struggles with expressing himself.)    SPECIAL CARE FACTORS FREQUENCY   (NA)                    Contractures Contractures Info: Not present    Additional Factors Info  Code Status, Allergies Code Status Info: Full Allergies Info: Morphine, Nsaids Psychotropic Info: Haldol, Clozaril, Invega, Restoril         Current Medications (10/19/2022):  This is the current hospital active medication list Current Facility-Administered Medications  Medication Dose Route Frequency Provider Last Rate Last Admin   acetaminophen (TYLENOL) tablet 650 mg  650 mg Oral Q6H PRN [name], [name], MD   650 mg at 10/14/22 2328   alum & mag hydroxide-simeth (MAALOX/MYLANTA) 200-200-20 MG/5ML suspension 30 mL  30 mL Oral Q4H PRN [name], [name], MD   30 mL at 04/26/22 0721   amoxicillin-clavulanate (AUGMENTIN) 875-125 MG per tablet 1 tablet  1 tablet Oral Q12H 04/28/22, MD   1 tablet at 10/19/22 10/21/22   aspirin EC tablet 81 mg  81 mg Oral Daily [name], [id number], MD   81 mg at 10/19/22 0814   atorvastatin (LIPITOR) tablet 20 mg  20 mg Oral Daily [name], [name], MD   20 mg at 10/19/22 0814   atropine 1 % ophthalmic solution 1 drop  1 drop Sublingual QID [name], MD   1 drop at 10/19/22 1600   benztropine (COGENTIN) tablet 0.5 mg  0.5 mg Oral BID [name], [name], MD   0.5 mg at 10/19/22 1600   clonazePAM (KLONOPIN) tablet 1 mg  1 mg Oral TID PRN He, [name], MD   1 mg at 10/14/22 1105   cloZAPine (CLOZARIL) tablet 300  mg  300 mg Oral QHS [name], [name], MD   300 mg at 10/18/22 2112   diphenhydrAMINE (BENADRYL) capsule 50 mg  50 mg Oral Q6H PRN [name], [name], MD   50 mg at 09/30/22 2119   Or   diphenhydrAMINE (BENADRYL) injection 50 mg  50 mg Intramuscular Q6H PRN [name], [name], MD       doxycycline (VIBRA-TABS) tablet 100 mg  100 mg Oral Q12H Wieting, Richard, MD   100 mg at 10/19/22 5400   gabapentin (NEURONTIN) capsule 300 mg  300 mg Oral TID [name], [name], MD   300 mg at 10/19/22 1523   haloperidol (HALDOL) tablet 2 mg  2 mg Oral TID [name], [name], MD   2 mg at 10/19/22 1522   haloperidol (HALDOL) tablet 5 mg  5 mg Oral Q6H PRN [name], [name], MD   5 mg at 09/17/22 1532   Or   haloperidol lactate (HALDOL) injection 5 mg  5 mg Intramuscular Q6H PRN [name], [name], MD       lisinopril (ZESTRIL) tablet 5 mg  5 mg Oral Daily [name], [name], MD   5 mg at 10/19/22 8676   magnesium hydroxide (MILK OF MAGNESIA) suspension 30 mL  30 mL Oral Daily PRN [name], [name], MD   30 mL at 09/15/22 1047   metoprolol succinate (TOPROL-XL) 24 hr tablet 25 mg  25 mg Oral Daily [name], [name], MD   25 mg at 10/19/22 0813   ondansetron (ZOFRAN) tablet 4 mg  4 mg Oral Q8H PRN Lerry Liner M, NP   4 mg at 09/06/22 2320   paliperidone (INVEGA) 24 hr tablet 6 mg  6 mg Oral QHS [name], [name], MD   6 mg at 10/18/22 2112   senna-docusate (Senokot-S) tablet 2 tablet  2 tablet Oral QHS PRN Sarina Ill, DO   2 tablet at 09/15/22 2110   temazepam (RESTORIL) capsule 15 mg  15 mg Oral QHS [name], [name], MD   15 mg at 10/18/22 2112   ziprasidone (GEODON) injection 20 mg  20 mg Intramuscular Q12H PRN [name], [name], MD   20 mg at 07/08/22 1453     Discharge Medications: Please see discharge summary for a list of discharge medications.  Relevant Imaging Results:  Relevant Lab Results:   Additional Information SS#: [id number]  Harden Mo, LCSW

## 2022-10-19 NOTE — Group Note (Signed)
[facility] LCSW Group Therapy Note   Group Date: 10/19/2022 Start Time: 1300 End Time: 1400  Type of Therapy and Topic:  Group Therapy:  Feelings around Relapse and Recovery  Participation Level:  None    Description of Group:    Patients in this group will discuss emotions they experience before and after a relapse. They will process how experiencing these feelings, or avoidance of experiencing them, relates to having a relapse. Facilitator will guide patients to explore emotions they have related to recovery. Patients will be encouraged to process which emotions are more powerful. They will be guided to discuss the emotional reaction significant others in their lives may have to patients' relapse or recovery. Patients will be assisted in exploring ways to respond to the emotions of others without this contributing to a relapse.  Therapeutic Goals: Patient will identify two or more emotions that lead to relapse for them:  Patient will identify two emotions that result when they relapse:  Patient will identify two emotions related to recovery:  Patient will demonstrate ability to communicate their needs through discussion and/or role plays.   Summary of Patient Progress: Patient came into group as it was closing and was not able to participate in the larger discussion.   Therapeutic Modalities:   Cognitive Behavioral Therapy Solution-Focused Therapy Assertiveness Training Relapse Prevention Therapy   Glenis Smoker, LCSW

## 2022-10-20 NOTE — Plan of Care (Signed)
D- Patient alert and oriented to person and situation. Patient woke up in an irritable mood, after coming to the nurses station looking for his cell phone, that was approved by leadership to have on the unit. Patient is very particular about his belongings and if they are not where he left them, he gets agitated. Patient is childlike an due to a past stroke, he has expressive aphasia. Patient continued to be irritable, but he did let this writer obtain a set of vitals on him. Afterwards, patient came into the medication room to take his scheduled medicine, in which he spit them back into the cup. This writer pick the cup up and explained to patient that this behavior would not be tolerated. So, he picked up the cup and proceeded to take the medication. Patient denies SI, HI, AVH, and pain at this time and then walked out of the medication room. Patient had no stated goals for today.  A- Scheduled medications administered to patient, per MD orders, after redirection. Support and encouragement provided.  Routine safety checks conducted every 15 minutes.  Patient informed to notify staff with problems or concerns.  R- No adverse drug reactions noted. Patient contracts for safety at this time. Patient remains safe at this time.  Problem: Education: Goal: Ability to state activities that reduce stress will improve Outcome: Not Progressing   Problem: Coping: Goal: Ability to identify and develop effective coping behavior will improve Outcome: Not Progressing   Problem: Self-Concept: Goal: Ability to identify factors that promote anxiety will improve Outcome: Not Progressing Goal: Level of anxiety will decrease Outcome: Not Progressing Goal: Ability to modify response to factors that promote anxiety will improve Outcome: Not Progressing   Problem: Education: Goal: Knowledge of General Education information will improve Description: Including pain rating scale, medication(s)/side effects and  non-pharmacologic comfort measures Outcome: Not Progressing   Problem: Health Behavior/Discharge Planning: Goal: Ability to manage health-related needs will improve Outcome: Not Progressing   Problem: Clinical Measurements: Goal: Ability to maintain clinical measurements within normal limits will improve Outcome: Not Progressing Goal: Will remain free from infection Outcome: Not Progressing Goal: Diagnostic test results will improve Outcome: Not Progressing Goal: Respiratory complications will improve Outcome: Not Progressing Goal: Cardiovascular complication will be avoided Outcome: Not Progressing   Problem: Activity: Goal: Risk for activity intolerance will decrease Outcome: Not Progressing   Problem: Nutrition: Goal: Adequate nutrition will be maintained Outcome: Not Progressing   Problem: Coping: Goal: Level of anxiety will decrease Outcome: Not Progressing   Problem: Elimination: Goal: Will not experience complications related to bowel motility Outcome: Not Progressing Goal: Will not experience complications related to urinary retention Outcome: Not Progressing   Problem: Pain Managment: Goal: General experience of comfort will improve Outcome: Not Progressing   Problem: Safety: Goal: Ability to remain free from injury will improve Outcome: Not Progressing   Problem: Skin Integrity: Goal: Risk for impaired skin integrity will decrease Outcome: Not Progressing   Problem: Activity: Goal: Interest or engagement in leisure activities will improve Outcome: Not Progressing   Problem: Coping: Goal: Coping ability will improve Outcome: Not Progressing   Problem: Education: Goal: Will be free of psychotic symptoms Outcome: Not Progressing

## 2022-10-20 NOTE — Progress Notes (Signed)
[facility] MD Progress Note  10/20/2022 9:55 AM Adam [name]  MRN:  [id number] Subjective:  Adam [name] is seen in his room today.  He is cheerful, reports no concerns.  Indicates sleeping and eating well.  Energy is at his baseline.  Per nursing report, he was upset last evening but did not become physically aggressive.  Taking his medications.  Offers limited information. 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)   Stroke (HCC)   HTN (hypertension)   HLD (hyperlipidemia)   CAD (coronary artery disease)   Chronic systolic CHF (congestive heart failure) (HCC)   Anxiety   Infection of left earlobe  Total Time spent with patient: 20 minutes  Past Psychiatric History: Past history of schizophrenia and a history of aphasia presumed to be related to stroke  Past Medical History:  Past Medical History:  Diagnosis Date   Myocardial infarction (HCC)    Schizophrenia (HCC)    Stroke (HCC)     Past Surgical History:  Procedure Laterality Date   NO PAST SURGERIES     Family History:  Family History  Problem Relation Age of Onset   Hypertension Mother    Family Psychiatric  History: None reported Social History:  Social History   Substance and Sexual Activity  Alcohol Use Not Currently     Social History   Substance and Sexual Activity  Drug Use Not Currently    Social History   Socioeconomic History   Marital status: Single    Spouse name: Not on file   Number of children: Not on file   Years of education: Not on file   Highest education level: Not on file  Occupational History   Not on file  Tobacco Use   Smoking status: Never    Passive exposure: Never   Smokeless tobacco: Never  Vaping Use   Vaping Use: Unknown  Substance and Sexual Activity   Alcohol  use: Not Currently   Drug use: Not Currently   Sexual activity: Not Currently  Other Topics Concern   Not on file  Social History Narrative   Not on file   Social Determinants of Health   Financial Resource Strain: Not on file  Food Insecurity: Not on file  Transportation Needs: Not on file  Physical Activity: Not on file  Stress: Not on file  Social Connections: Not on file   Additional Social History:  Specify valuables returned:  (none)                      Sleep: Fair  Appetite:  Fair  Current Medications: Current Facility-Administered Medications  Medication Dose Route Frequency Provider Last Rate Last Admin   acetaminophen (TYLENOL) tablet 650 mg  650 mg Oral Q6H PRN [name], [name], MD   650 mg at 10/14/22 2328   alum & mag hydroxide-simeth (MAALOX/MYLANTA) 200-200-20 MG/5ML suspension 30 mL  30 mL Oral Q4H PRN [name], [name], MD   30 mL at 04/26/22 0721   amoxicillin-clavulanate (AUGMENTIN) 875-125 MG per tablet 1 tablet  1 tablet Oral Q12H Alford Highland, MD   1 tablet at 10/19/22 2100   aspirin EC tablet 81 mg  81 mg Oral Daily [name], [name], MD   81 mg at 10/19/22 0814   atorvastatin (LIPITOR) tablet 20 mg  20 mg Oral Daily [name], [name],  MD   20 mg at 10/19/22 0814   atropine 1 % ophthalmic solution 1 drop  1 drop Sublingual QID [name], MD   1 drop at 10/19/22 2100   benztropine (COGENTIN) tablet 0.5 mg  0.5 mg Oral BID [name], [name], MD   0.5 mg at 10/19/22 1600   clonazePAM (KLONOPIN) tablet 1 mg  1 mg Oral TID PRN He, [name], MD   1 mg at 10/14/22 1105   cloZAPine (CLOZARIL) tablet 300 mg  300 mg Oral QHS [name], [name], MD   300 mg at 10/19/22 2118   diphenhydrAMINE (BENADRYL) capsule 50 mg  50 mg Oral Q6H PRN [name], [name], MD   50 mg at 09/30/22 2119   Or   diphenhydrAMINE (BENADRYL) injection 50 mg  50 mg Intramuscular Q6H PRN [name], [name], MD       doxycycline (VIBRA-TABS) tablet 100 mg  100 mg Oral Q12H Wieting, Richard, MD   100  mg at 10/19/22 2100   gabapentin (NEURONTIN) capsule 300 mg  300 mg Oral TID [name], [name], MD   300 mg at 10/19/22 2100   haloperidol (HALDOL) tablet 2 mg  2 mg Oral TID [name], [name], MD   2 mg at 10/19/22 2100   haloperidol (HALDOL) tablet 5 mg  5 mg Oral Q6H PRN [name], [name], MD   5 mg at 09/17/22 1532   Or   haloperidol lactate (HALDOL) injection 5 mg  5 mg Intramuscular Q6H PRN [name], [name], MD       lisinopril (ZESTRIL) tablet 5 mg  5 mg Oral Daily [name], [name], MD   5 mg at 10/19/22 5427   magnesium hydroxide (MILK OF MAGNESIA) suspension 30 mL  30 mL Oral Daily PRN [name], [name], MD   30 mL at 09/15/22 1047   metoprolol succinate (TOPROL-XL) 24 hr tablet 25 mg  25 mg Oral Daily [name], [name], MD   25 mg at 10/19/22 0813   ondansetron (ZOFRAN) tablet 4 mg  4 mg Oral Q8H PRN Jearld Lesch, NP   4 mg at 09/06/22 2320   paliperidone (INVEGA) 24 hr tablet 6 mg  6 mg Oral QHS [name], [name], MD   6 mg at 10/19/22 2100   senna-docusate (Senokot-S) tablet 2 tablet  2 tablet Oral QHS PRN Sarina Ill, DO   2 tablet at 09/15/22 2110   temazepam (RESTORIL) capsule 15 mg  15 mg Oral QHS [name], [name], MD   15 mg at 10/18/22 2112   ziprasidone (GEODON) injection 20 mg  20 mg Intramuscular Q12H PRN [name], [name], MD   20 mg at 07/08/22 1453    Lab Results:  Results for orders placed or performed during the hospital encounter of 03/19/22 (from the past 48 hour(s))  CBC with Differential/Platelet     Status: Abnormal   Collection Time: 10/19/22 12:57 PM  Result Value Ref Range   WBC 4.6 4.0 - 10.5 K/uL   RBC 4.06 (L) 4.22 - 5.81 MIL/uL   Hemoglobin 12.6 (L) 13.0 - 17.0 g/dL   HCT 06.2 (L) 37.6 - 28.3 %   MCV 88.9 80.0 - 100.0 fL   MCH 31.0 26.0 - 34.0 pg   MCHC 34.9 30.0 - 36.0 g/dL   RDW 15.1 76.1 - 60.7 %   Platelets 184 150 - 400 K/uL   nRBC 0.0 0.0 - 0.2 %   Neutrophils Relative % 68 %  Neutro Abs 3.1 1.7 - 7.7 K/uL   Lymphocytes Relative 20 %    Lymphs Abs 0.9 0.7 - 4.0 K/uL   Monocytes Relative 9 %   Monocytes Absolute 0.4 0.1 - 1.0 K/uL   Eosinophils Relative 0 %   Eosinophils Absolute 0.0 0.0 - 0.5 K/uL   Basophils Relative 2 %   Basophils Absolute 0.1 0.0 - 0.1 K/uL   Immature Granulocytes 1 %   Abs Immature Granulocytes 0.03 0.00 - 0.07 K/uL    Comment: Performed at [facility], [address] [place], [place] [address]    Blood Alcohol level:  Lab Results  Component Value Date   [facility] <10 07/20/2021    Metabolic Disorder Labs: Lab Results  Component Value Date   HGBA1C 5.5 04/10/2022   MPG 111.15 04/10/2022   No results found for: "PROLACTIN" Lab Results  Component Value Date   CHOL 167 11/20/2021   TRIG 107 11/20/2021   HDL 26 (L) 11/20/2021   CHOLHDL 6.4 11/20/2021   VLDL 21 11/20/2021   LDLCALC 120 (H) 11/20/2021    Physical Findings: AIMS: Facial and Oral Movements Muscles of Facial Expression: None, normal Lips and Perioral Area: None, normal Jaw: None, normal Tongue: None, normal,Extremity Movements Upper (arms, wrists, hands, fingers): None, normal Lower (legs, knees, ankles, toes): None, normal, Trunk Movements Neck, shoulders, hips: None, normal, Overall Severity Severity of abnormal movements (highest score from questions above): None, normal Incapacitation due to abnormal movements: None, normal Patient's awareness of abnormal movements (rate only patient's report): No Awareness, Dental Status Current problems with teeth and/or dentures?: No Does patient usually wear dentures?: No  CIWA:    COWS:     Musculoskeletal: Strength & Muscle Tone: within normal limits Gait & Station: normal Patient leans: N/A  Psychiatric Specialty Exam:  Presentation  General Appearance:  Patient showered this morning.  Hair is disheveled.  Eye Contact: Fair  Speech: Slow  Speech Volume: Decreased  Handedness: Left   Mood and Affect  Mood: Good Affect: Congruent   Thought  Process  Thought Processes: Goal Directed  Descriptions of Associations:Circumstantial  Orientation:Partial  Thought Content:Scattered  History of Schizophrenia/Schizoaffective disorder:Yes  Duration of Psychotic Symptoms:Greater than six months  Hallucinations:No data recorded Ideas of Reference:None  Suicidal Thoughts:No data recorded Homicidal Thoughts:No data recorded  Sensorium  Memory: Remote Good  Judgment: Fair  Insight: Fair   Art therapist  Concentration: Fair  Attention Span: Fair  Recall: Fair  Fund of Knowledge: Fair  Language: Fair   Psychomotor Activity  Psychomotor Activity:No data recorded  Slept well  Physical Exam: Physical Exam Vitals and nursing note reviewed.  Constitutional:      Appearance: Normal appearance.  HENT:     Head: Normocephalic and atraumatic.     Mouth/Throat:     Pharynx: Oropharynx is clear.  Eyes:     Pupils: Pupils are equal, round, and reactive to light.  Cardiovascular:     Rate and Rhythm: Normal rate and regular rhythm.  Pulmonary:     Effort: Pulmonary effort is normal.     Breath sounds: Normal breath sounds.  Abdominal:     General: Abdomen is flat.     Palpations: Abdomen is soft.  Musculoskeletal:        General: Normal range of motion.  Skin:    General: Skin is warm and dry.  Neurological:     General: No focal deficit present.     Mental Status: He is alert. Mental status is  at baseline.  Psychiatric:        Mood and Affect: Mood normal.        Thought Content: Thought content normal.    Review of Systems  Unable to perform ROS: Language   Blood pressure 95/74, pulse 89, temperature 97.6 F (36.4 C), temperature source Oral, resp. rate 18, height 5\' 8"  (1.727 m), weight 85.8 kg, SpO2 97 %. Body mass index is 28.76 kg/m.   Treatment Plan Summary: Medication management and Plan patient appears to be very stable.  No change in clinical presentation.  Encourage  patient's.  Encourage group attendance.  Case discussed daily with treatment team.  We are only waiting on some kind of breakthrough to allow for placement.  11/18 No changes  Reggie Pile, MD 10/20/2022, 9:55 AM

## 2022-10-21 NOTE — Group Note (Signed)
LCSW Group Therapy Note   Group Date: 10/21/2022 Start Time: 1300 End Time: 1400   Type of Therapy and Topic:  Group Therapy: Challenging Core Beliefs  Participation Level:  None  Description of Group:  Patients were educated about core beliefs and asked to identify one harmful core belief that they have. Patients were asked to explore from where those beliefs originate. Patients were asked to discuss how those beliefs make them feel and the resulting behaviors of those beliefs. They were then be asked if those beliefs are true and, if so, what evidence they have to support them. Lastly, group members were challenged to replace those negative core beliefs with helpful beliefs.   Therapeutic Goals:   1. Patient will identify harmful core beliefs and explore the origins of such beliefs. 2. Patient will identify feelings and behaviors that result from those core beliefs. 3. Patient will discuss whether such beliefs are true. 4.  Patient will replace harmful core beliefs with helpful ones.  Summary of Patient Progress:   Patient attended group, however, was unable to participate in discussions.  Patient would state "college" at intervals in group, though not appropriate to current discussion.   Therapeutic Modalities: Cognitive Behavioral Therapy; Solution-Focused Therapy   Harden Mo, LCSWA 10/21/2022  2:26 PM

## 2022-10-21 NOTE — Progress Notes (Signed)
[facility] MD Progress Note  10/21/2022 9:20 AM Adam [name]  MRN:  [id number] Subjective:  11/19 Patient reports no concerns today.  No new medical problems.  Sleeping and eating well.  Energy seems to be at his baseline.  No other sharing points.  11/18 Adam [name] is seen in his room today.  He is cheerful, reports no concerns.  Indicates sleeping and eating well.  Energy is at his baseline.  Per nursing report, he was upset last evening but did not become physically aggressive.  Taking his medications.  Offers limited information. 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[place])   Stroke (HCC)   HTN (hypertension)   HLD (hyperlipidemia)   CAD (coronary artery disease)   Chronic systolic CHF (congestive heart failure) (HCC)   Anxiety   Infection of left earlobe  Total Time spent with patient: 19 minutes  Past Psychiatric History: Past history of schizophrenia and a history of aphasia presumed to be related to stroke  Past Medical History:  Past Medical History:  Diagnosis Date   Myocardial infarction ([place])    Schizophrenia ([place])    Stroke ([place])     Past Surgical History:  Procedure Laterality Date   NO PAST SURGERIES     Family History:  Family History  Problem Relation Age of Onset   Hypertension Mother    Family Psychiatric  History: None reported Social History:  Social History   Substance and Sexual Activity  Alcohol Use Not Currently     Social History   Substance and Sexual Activity  Drug Use Not Currently    Social History   Socioeconomic History   Marital status: Single    Spouse name: Not on file   Number of children: Not on file   Years of education: Not on file   Highest education level: Not on file  Occupational History   Not on file   Tobacco Use   Smoking status: Never    Passive exposure: Never   Smokeless tobacco: Never  Vaping Use   Vaping Use: Unknown  Substance and Sexual Activity   Alcohol use: Not Currently   Drug use: Not Currently   Sexual activity: Not Currently  Other Topics Concern   Not on file  Social History Narrative   Not on file   Social Determinants of Health   Financial Resource Strain: Not on file  Food Insecurity: Not on file  Transportation Needs: Not on file  Physical Activity: Not on file  Stress: Not on file  Social Connections: Not on file   Additional Social History:  Specify valuables returned:  (none)                      Sleep: Fair  Appetite:  Fair  Current Medications: Current Facility-Administered Medications  Medication Dose Route Frequency Provider Last Rate Last Admin   acetaminophen (TYLENOL) tablet 650 mg  650 mg Oral Q6H PRN [name], [name], MD   650 mg at 10/14/22 2328   alum & mag hydroxide-simeth (MAALOX/MYLANTA) 200-200-20 MG/5ML suspension 30 mL  30 mL Oral Q4H PRN [name], [name], MD   30 mL at 04/26/22 0721   aspirin EC tablet 81 mg  81 mg Oral Daily [name], [name], MD   81 mg at 10/20/22 0955   atorvastatin (LIPITOR) tablet 20 mg  20  mg Oral Daily [name], [name], MD   20 mg at 10/20/22 0955   atropine 1 % ophthalmic solution 1 drop  1 drop Sublingual QID [name], MD   1 drop at 10/20/22 2126   benztropine (COGENTIN) tablet 0.5 mg  0.5 mg Oral BID [name], [name], MD   0.5 mg at 10/20/22 1600   clonazePAM (KLONOPIN) tablet 1 mg  1 mg Oral TID PRN He, [name], MD   1 mg at 10/14/22 1105   cloZAPine (CLOZARIL) tablet 300 mg  300 mg Oral QHS [name], [name], MD   300 mg at 10/20/22 2125   diphenhydrAMINE (BENADRYL) capsule 50 mg  50 mg Oral Q6H PRN [name], [name], MD   50 mg at 09/30/22 2119   Or   diphenhydrAMINE (BENADRYL) injection 50 mg  50 mg Intramuscular Q6H PRN [name], [name], MD       gabapentin (NEURONTIN) capsule 300 mg  300 mg  Oral TID [name], [name], MD   300 mg at 10/20/22 2100   haloperidol (HALDOL) tablet 2 mg  2 mg Oral TID [name], [name], MD   2 mg at 10/20/22 2100   haloperidol (HALDOL) tablet 5 mg  5 mg Oral Q6H PRN [name], [name], MD   5 mg at 09/17/22 1532   Or   haloperidol lactate (HALDOL) injection 5 mg  5 mg Intramuscular Q6H PRN [name], [name], MD       lisinopril (ZESTRIL) tablet 5 mg  5 mg Oral Daily [name], [name], MD   5 mg at 10/20/22 0955   magnesium hydroxide (MILK OF MAGNESIA) suspension 30 mL  30 mL Oral Daily PRN [name], [name], MD   30 mL at 09/15/22 1047   metoprolol succinate (TOPROL-XL) 24 hr tablet 25 mg  25 mg Oral Daily [name], [name], MD   25 mg at 10/20/22 0955   ondansetron (ZOFRAN) tablet 4 mg  4 mg Oral Q8H PRN Deloria Lair, NP   4 mg at 09/06/22 2320   paliperidone (INVEGA) 24 hr tablet 6 mg  6 mg Oral QHS [name], [name], MD   6 mg at 10/20/22 2124   senna-docusate (Senokot-S) tablet 2 tablet  2 tablet Oral QHS PRN [name], DO   2 tablet at 09/15/22 2110   temazepam (RESTORIL) capsule 15 mg  15 mg Oral QHS [name], [name], MD   15 mg at 10/20/22 2126   ziprasidone (GEODON) injection 20 mg  20 mg Intramuscular Q12H PRN [name], [name], MD   20 mg at 07/08/22 1453    Lab Results:  Results for orders placed or performed during the hospital encounter of 03/19/22 (from the past 48 hour(s))  CBC with Differential/Platelet     Status: Abnormal   Collection Time: 10/19/22 12:57 PM  Result Value Ref Range   WBC 4.6 4.0 - 10.5 K/uL   RBC 4.06 (L) 4.22 - 5.81 MIL/uL   Hemoglobin 12.6 (L) 13.0 - 17.0 g/dL   HCT 36.1 (L) 39.0 - 52.0 %   MCV 88.9 80.0 - 100.0 fL   MCH 31.0 26.0 - 34.0 pg   MCHC 34.9 30.0 - 36.0 g/dL   RDW 13.2 11.5 - 15.5 %   Platelets 184 150 - 400 K/uL   nRBC 0.0 0.0 - 0.2 %   Neutrophils Relative % 68 %   Neutro Abs 3.1 1.7 - 7.7 K/uL   Lymphocytes Relative 20 %   Lymphs  Abs 0.9 0.7 - 4.0 K/uL   Monocytes Relative 9 %   Monocytes  Absolute 0.4 0.1 - 1.0 K/uL   Eosinophils Relative 0 %   Eosinophils Absolute 0.0 0.0 - 0.5 K/uL   Basophils Relative 2 %   Basophils Absolute 0.1 0.0 - 0.1 K/uL   Immature Granulocytes 1 %   Abs Immature Granulocytes 0.03 0.00 - 0.07 K/uL    Comment: Performed at [facility], [address] [place], [place] [address]    Blood Alcohol level:  Lab Results  Component Value Date   [facility] <10 [id number]    Metabolic Disorder Labs: Lab Results  Component Value Date   HGBA1C 5.5 04/10/2022   MPG 111.15 04/10/2022   No results found for: "PROLACTIN" Lab Results  Component Value Date   CHOL 167 11/20/2021   TRIG 107 11/20/2021   HDL 26 (L) 11/20/2021   CHOLHDL 6.4 11/20/2021   VLDL 21 11/20/2021   LDLCALC 120 (H) 11/20/2021    Physical Findings: AIMS: Facial and Oral Movements Muscles of Facial Expression: None, normal Lips and Perioral Area: None, normal Jaw: None, normal Tongue: None, normal,Extremity Movements Upper (arms, wrists, hands, fingers): None, normal Lower (legs, knees, ankles, toes): None, normal, Trunk Movements Neck, shoulders, hips: None, normal, Overall Severity Severity of abnormal movements (highest score from questions above): None, normal Incapacitation due to abnormal movements: None, normal Patient's awareness of abnormal movements (rate only patient's report): No Awareness, Dental Status Current problems with teeth and/or dentures?: No Does patient usually wear dentures?: No  CIWA:    COWS:     Musculoskeletal: Strength & Muscle Tone: within normal limits Gait & Station: normal Patient leans: N/A  Psychiatric Specialty Exam:  Presentation  General Appearance:  Patient showered this morning.  Hair is disheveled.  Eye Contact: Fair  Speech: Slow  Speech Volume: Decreased  Handedness: Left   Mood and Affect  Mood: Good Affect: Congruent   Thought Process  Thought Processes: Goal Directed  Descriptions of  Associations:Circumstantial  Orientation:Partial  Thought Content:Scattered  History of Schizophrenia/Schizoaffective disorder:Yes  Duration of Psychotic Symptoms:Greater than six months  Hallucinations:No data recorded Ideas of Reference:None  Suicidal Thoughts:No data recorded Homicidal Thoughts:No data recorded  Sensorium  Memory: Remote Good  Judgment: Fair  Insight: Fair   Community education officer  Concentration: Fair  Attention Span: Fair  Recall: [place] of Knowledge: Fair  Language: Fair   Psychomotor Activity  Psychomotor Activity:No data recorded  Slept well  Physical Exam: Physical Exam Vitals and nursing note reviewed.  Constitutional:      Appearance: Normal appearance.  HENT:     Head: Normocephalic and atraumatic.     Mouth/Throat:     Pharynx: Oropharynx is clear.  Eyes:     Pupils: Pupils are equal, round, and reactive to light.  Cardiovascular:     Rate and Rhythm: Normal rate and regular rhythm.  Pulmonary:     Effort: Pulmonary effort is normal.     Breath sounds: Normal breath sounds.  Abdominal:     General: Abdomen is flat.     Palpations: Abdomen is soft.  Musculoskeletal:        General: Normal range of motion.  Skin:    General: Skin is warm and dry.  Neurological:     General: No focal deficit present.     Mental Status: He is alert. Mental status is at baseline.  Psychiatric:        Mood and Affect: Mood normal.  Thought Content: Thought content normal.    Review of Systems  Unable to perform ROS: Language   Blood pressure 95/74, pulse 89, temperature (!) 97.5 F (36.4 C), temperature source Oral, resp. rate 18, height 5\' 8"  (1.727 m), weight 85.8 kg, SpO2 97 %. Body mass index is 28.76 kg/m.   Treatment Plan Summary: Medication management and Plan patient appears to be very stable.  No change in clinical presentation.  Encourage patient's.  Encourage group attendance.  Case discussed daily with  treatment team.  We are only waiting on some kind of breakthrough to allow for placement.  11/18 No changes  11/19 No changes  12/19, MD 10/21/2022, 9:20 AM

## 2022-10-21 NOTE — Plan of Care (Signed)
D: Patient alert and oriented. Patient denies pain. Patient denies anxiety and depression. Patient denies SI/HI/AVH. Patient frequently observed around the milieu.  A: Scheduled medications administered to patient, per MD orders.  Support and encouragement provided to patient.  Q15 minute safety checks maintained.   R: Patient compliant with medication administration and treatment plan. No adverse drug reactions noted. Patient remains safe on the unit at this time. Problem: Education: Goal: Knowledge of General Education information will improve Description: Including pain rating scale, medication(s)/side effects and non-pharmacologic comfort measures Outcome: Progressing   Problem: Clinical Measurements: Goal: Ability to maintain clinical measurements within normal limits will improve Outcome: Progressing   Problem: Safety: Goal: Ability to remain free from injury will improve Outcome: Progressing

## 2022-10-21 NOTE — Progress Notes (Signed)
Patient alert with periods of confusion to time and situation, he appears restless, angry and agitated. Patient was banging the nurses station window demanding for writer and other staff members to print pictures of  various people off the computer. Staff tried to redirect patient he was agitated and not receptive. Patient was offeree emotional support,15 minutes safety checks maintained, will continue to monitor.

## 2022-10-22 NOTE — Progress Notes (Signed)
Patient calm and cooperative during assessment denying SI/HI/AVH. Patient observed interacting appropriately with staff and peers on the unit. Patient compliant with medication administration per MD orders. Pt being monitored Q 15 minutes for safety per unit protocol, remains safe on the unit  

## 2022-10-22 NOTE — Progress Notes (Signed)
Recreation Therapy Notes    Date: 10/22/2022  Time: 10:40 am    Location: Craft room   Behavioral response: Appropriate   Intervention Topic:  Stress Management     Discussion/Intervention:  Group content on today was focused on stress. The group defined stress and way to cope with stress. Participants expressed how they know when they are stresses out. Individuals described the different ways they have to cope with stress. The group stated reasons why it is important to cope with stress. Patient explained what good stress is and some examples. The group participated in the intervention "Stress Management". Individuals were separated into two group and answered questions related to stress.  Clinical Observations/Feedback: Patient came to group and was focused on what peers and staff had to say about stress management. When asked how he manages stress he responded by saying walking Kieli Golladay LRT/CTRS         Makeba Delcastillo 10/22/2022 1:47 PM

## 2022-10-22 NOTE — Progress Notes (Signed)
Pt denies SI/HI/AVH and verbally agrees to approach staff if these become apparent or before harming themselves/others. Pt has been pleasant today and had no outbursts. Pt is now wanting to be madonna and wanted to shave. Scheduled medications administered to pt, per MD orders. RN provided support and encouragement to pt. Q15 min safety checks implemented and continued. Pt safe on the unit. RN will continue to monitor and intervene as needed.  Problem: Self-Concept: Goal: Level of anxiety will decrease Outcome: Progressing   Problem: Health Behavior/Discharge Planning: Goal: Ability to manage health-related needs will improve Outcome: Progressing    10/22/22 0924  Psych Admission Type (Psych Patients Only)  Admission Status Involuntary  Psychosocial Assessment  Patient Complaints None  Eye Contact Fair  Facial Expression Other (Comment) (WDL)  Affect Preoccupied  Speech Aphasic  Interaction Assertive;Demanding  Motor Activity Slow  Appearance/Hygiene Unremarkable  Behavior Characteristics Cooperative;Impulsive  Mood Preoccupied;Pleasant  Thought Process  Coherency Circumstantial  Content Preoccupation  Delusions None reported or observed  Perception WDL  Hallucination None reported or observed  Judgment Impaired  Confusion None  Danger to Self  Current suicidal ideation? Denies  Danger to Others  Danger to Others None reported or observed  Danger to Others Abnormal  Harmful Behavior to others No threats or harm toward other people  Destructive Behavior No threats or harm toward property

## 2022-10-22 NOTE — Progress Notes (Signed)
[facility] MD Progress Note  10/22/2022 1:52 PM Adam [name]  MRN:  [id number] Subjective: Nothing new.  Nothing different.  Exactly the same as always.  Behavior calm for the most part. 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)   Stroke (HCC)   HTN (hypertension)   HLD (hyperlipidemia)   CAD (coronary artery disease)   Chronic systolic CHF (congestive heart failure) (HCC)   Anxiety   Infection of left earlobe  Total Time spent with patient: 30 minutes  Past Psychiatric History: History of schizophrenia  Past Medical History:  Past Medical History:  Diagnosis Date   Myocardial infarction (HCC)    Schizophrenia (HCC)    Stroke (HCC)     Past Surgical History:  Procedure Laterality Date   NO PAST SURGERIES     Family History:  Family History  Problem Relation Age of Onset   Hypertension Mother    Family Psychiatric  History: None Social History:  Social History   Substance and Sexual Activity  Alcohol Use Not Currently     Social History   Substance and Sexual Activity  Drug Use Not Currently    Social History   Socioeconomic History   Marital status: Single    Spouse name: Not on file   Number of children: Not on file   Years of education: Not on file   Highest education level: Not on file  Occupational History   Not on file  Tobacco Use   Smoking status: Never    Passive exposure: Never   Smokeless tobacco: Never  Vaping Use   Vaping Use: Unknown  Substance and Sexual Activity   Alcohol use: Not Currently   Drug use: Not Currently   Sexual activity: Not Currently  Other Topics Concern   Not on file  Social History Narrative   Not on file   Social Determinants of Health   Financial Resource Strain: Not on file  Food Insecurity:  Not on file  Transportation Needs: Not on file  Physical Activity: Not on file  Stress: Not on file  Social Connections: Not on file   Additional Social History:  Specify valuables returned:  (none)                      Sleep: Fair  Appetite:  Fair  Current Medications: Current Facility-Administered Medications  Medication Dose Route Frequency Provider Last Rate Last Admin   acetaminophen (TYLENOL) tablet 650 mg  650 mg Oral Q6H PRN Romelle Muldoon T, MD   650 mg at 10/14/22 2328   alum & mag hydroxide-simeth (MAALOX/MYLANTA) 200-200-20 MG/5ML suspension 30 mL  30 mL Oral Q4H PRN Jhonatan Lomeli T, MD   30 mL at 04/26/22 4098   aspirin EC tablet 81 mg  81 mg Oral Daily Lonald Troiani, [name], MD   81 mg at 10/22/22 0919   atorvastatin (LIPITOR) tablet 20 mg  20 mg Oral Daily Ellinore Merced T, MD   20 mg at 10/22/22 0919   atropine 1 % ophthalmic solution 1 drop  1 drop Sublingual QID [name], MD   1 drop at 10/22/22 1140   benztropine (COGENTIN) tablet 0.5 mg  0.5 mg Oral BID Maleya Leever T, MD   0.5 mg at 10/22/22 0919   clonazePAM (KLONOPIN) tablet 1 mg  1 mg Oral TID PRN He, [name],  MD   1 mg at 10/14/22 1105   cloZAPine (CLOZARIL) tablet 300 mg  300 mg Oral QHS Johanan Skorupski T, MD   300 mg at 10/21/22 2117   diphenhydrAMINE (BENADRYL) capsule 50 mg  50 mg Oral Q6H PRN Debroh Sieloff, [name], MD   50 mg at 09/30/22 2119   Or   diphenhydrAMINE (BENADRYL) injection 50 mg  50 mg Intramuscular Q6H PRN Valery Amedee T, MD       gabapentin (NEURONTIN) capsule 300 mg  300 mg Oral TID Sidnie Swalley T, MD   300 mg at 10/22/22 5361   haloperidol (HALDOL) tablet 2 mg  2 mg Oral TID Bambie Pizzolato T, MD   2 mg at 10/22/22 0920   haloperidol (HALDOL) tablet 5 mg  5 mg Oral Q6H PRN Bernice Mcauliffe T, MD   5 mg at 09/17/22 1532   Or   haloperidol lactate (HALDOL) injection 5 mg  5 mg Intramuscular Q6H PRN Tavarion Babington T, MD       lisinopril (ZESTRIL) tablet 5 mg  5 mg Oral Daily Avner Stroder T, MD   5  mg at 10/22/22 4431   magnesium hydroxide (MILK OF MAGNESIA) suspension 30 mL  30 mL Oral Daily PRN Tiarrah Saville T, MD   30 mL at 09/15/22 1047   metoprolol succinate (TOPROL-XL) 24 hr tablet 25 mg  25 mg Oral Daily Kross Swallows T, MD   25 mg at 10/22/22 0927   ondansetron (ZOFRAN) tablet 4 mg  4 mg Oral Q8H PRN Lerry Liner M, NP   4 mg at 09/06/22 2320   paliperidone (INVEGA) 24 hr tablet 6 mg  6 mg Oral QHS Carr Shartzer T, MD   6 mg at 10/21/22 2117   senna-docusate (Senokot-S) tablet 2 tablet  2 tablet Oral QHS PRN Sarina Ill, DO   2 tablet at 09/15/22 2110   temazepam (RESTORIL) capsule 15 mg  15 mg Oral QHS Toron Bowring T, MD   15 mg at 10/21/22 2117   ziprasidone (GEODON) injection 20 mg  20 mg Intramuscular Q12H PRN Aleena Kirkeby, [name], MD   20 mg at 07/08/22 1453    Lab Results: No results found for this or any previous visit (from the past 48 hour(s)).  Blood Alcohol level:  Lab Results  Component Value Date   ETH <10 07/20/2021    Metabolic Disorder Labs: Lab Results  Component Value Date   HGBA1C 5.5 04/10/2022   MPG 111.15 04/10/2022   No results found for: "PROLACTIN" Lab Results  Component Value Date   CHOL 167 11/20/2021   TRIG 107 11/20/2021   HDL 26 (L) 11/20/2021   CHOLHDL 6.4 11/20/2021   VLDL 21 11/20/2021   LDLCALC 120 (H) 11/20/2021    Physical Findings: AIMS: Facial and Oral Movements Muscles of Facial Expression: None, normal Lips and Perioral Area: None, normal Jaw: None, normal Tongue: None, normal,Extremity Movements Upper (arms, wrists, hands, fingers): None, normal Lower (legs, knees, ankles, toes): None, normal, Trunk Movements Neck, shoulders, hips: None, normal, Overall Severity Severity of abnormal movements (highest score from questions above): None, normal Incapacitation due to abnormal movements: None, normal Patient's awareness of abnormal movements (rate only patient's report): No Awareness, Dental Status Current  problems with teeth and/or dentures?: No Does patient usually wear dentures?: No  CIWA:    COWS:     Musculoskeletal: Strength & Muscle Tone: within normal limits Gait & Station: normal Patient leans: N/A  Psychiatric Specialty Exam:  Presentation  General Appearance:  Appropriate for Environment; Casual; Neat; Well Groomed  Eye Contact: Fair  Speech: Slow  Speech Volume: Decreased  Handedness: Left   Mood and Affect  Mood: Anxious  Affect: Congruent   Thought Process  Thought Processes: Goal Directed  Descriptions of Associations:Circumstantial  Orientation:Partial  Thought Content:Scattered  History of Schizophrenia/Schizoaffective disorder:Yes  Duration of Psychotic Symptoms:Greater than six months  Hallucinations:No data recorded Ideas of Reference:None  Suicidal Thoughts:No data recorded Homicidal Thoughts:No data recorded  Sensorium  Memory: Remote Good  Judgment: Fair  Insight: Fair   Art therapist  Concentration: Fair  Attention Span: Fair  Recall: Fair  Fund of Knowledge: Fair  Language: Fair   Psychomotor Activity  Psychomotor Activity:No data recorded  Assets  Assets:No data recorded  Sleep  Sleep:No data recorded   Physical Exam: Physical Exam Vitals reviewed.  Constitutional:      Appearance: Normal appearance.  HENT:     Head: Normocephalic and atraumatic.     Mouth/Throat:     Pharynx: Oropharynx is clear.  Eyes:     Pupils: Pupils are equal, round, and reactive to light.  Cardiovascular:     Rate and Rhythm: Normal rate and regular rhythm.  Pulmonary:     Effort: Pulmonary effort is normal.     Breath sounds: Normal breath sounds.  Abdominal:     General: Abdomen is flat.     Palpations: Abdomen is soft.  Musculoskeletal:        General: Normal range of motion.  Skin:    General: Skin is warm and dry.  Neurological:     General: No focal deficit present.     Mental Status: He  is alert. Mental status is at baseline.  Psychiatric:        Mood and Affect: Mood normal.        Thought Content: Thought content normal.    Review of Systems  Constitutional: Negative.   HENT: Negative.    Eyes: Negative.   Respiratory: Negative.    Cardiovascular: Negative.   Gastrointestinal: Negative.   Musculoskeletal: Negative.   Skin: Negative.   Neurological: Negative.   Psychiatric/Behavioral: Negative.     Blood pressure 115/87, pulse 91, temperature (!) 97.5 F (36.4 C), temperature source Oral, resp. rate 18, height 5\' 8"  (1.727 m), weight 85.8 kg, SpO2 99 %. Body mass index is 28.76 kg/m.   Treatment Plan Summary: Medication management and Plan continue current medication.  Supportive counseling and therapy.  No change to overall plan and intention of having discharge as soon as available  , MD 10/22/2022, 1:52 PM

## 2022-10-23 MED ORDER — PALIPERIDONE PALMITATE ER 156 MG/ML IM SUSY
156.0000 mg | PREFILLED_SYRINGE | INTRAMUSCULAR | Status: DC
  Administered 2022-10-23 – 2023-02-12 (×5): 156 mg via INTRAMUSCULAR
  Filled 2022-10-23 (×5): qty 1

## 2022-10-23 NOTE — Progress Notes (Signed)
[facility] MD Progress Note  10/23/2022 11:55 AM Adam [name]  MRN:  [id number] Subjective: Patient seen and chart reviewed.  No change to presentation.  Behavior appropriate and calm.  No aggression. 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)   Stroke (HCC)   HTN (hypertension)   HLD (hyperlipidemia)   CAD (coronary artery disease)   Chronic systolic CHF (congestive heart failure) (HCC)   Anxiety   Infection of left earlobe  Total Time spent with patient: 30 minutes  Past Psychiatric History: Past history of schizophrenia and history of stroke  Past Medical History:  Past Medical History:  Diagnosis Date   Myocardial infarction (HCC)    Schizophrenia (HCC)    Stroke (HCC)     Past Surgical History:  Procedure Laterality Date   NO PAST SURGERIES     Family History:  Family History  Problem Relation Age of Onset   Hypertension Mother    Family Psychiatric  History: See previous Social History:  Social History   Substance and Sexual Activity  Alcohol Use Not Currently     Social History   Substance and Sexual Activity  Drug Use Not Currently    Social History   Socioeconomic History   Marital status: Single    Spouse name: Not on file   Number of children: Not on file   Years of education: Not on file   Highest education level: Not on file  Occupational History   Not on file  Tobacco Use   Smoking status: Never    Passive exposure: Never   Smokeless tobacco: Never  Vaping Use   Vaping Use: Unknown  Substance and Sexual Activity   Alcohol use: Not Currently   Drug use: Not Currently   Sexual activity: Not Currently  Other Topics Concern   Not on file  Social History Narrative   Not on file   Social Determinants of Health    Financial Resource Strain: Not on file  Food Insecurity: Not on file  Transportation Needs: Not on file  Physical Activity: Not on file  Stress: Not on file  Social Connections: Not on file   Additional Social History:  Specify valuables returned:  (none)                      Sleep: Fair  Appetite:  Fair  Current Medications: Current Facility-Administered Medications  Medication Dose Route Frequency Provider Last Rate Last Admin   acetaminophen (TYLENOL) tablet 650 mg  650 mg Oral Q6H PRN Zalayah Pizzuto T, MD   650 mg at 10/14/22 2328   alum & mag hydroxide-simeth (MAALOX/MYLANTA) 200-200-20 MG/5ML suspension 30 mL  30 mL Oral Q4H PRN Sircharles Holzheimer T, MD   30 mL at 04/26/22 3419   aspirin EC tablet 81 mg  81 mg Oral Daily Serrita Lueth, [name], MD   81 mg at 10/23/22 0825   atorvastatin (LIPITOR) tablet 20 mg  20 mg Oral Daily Briasia Flinders T, MD   20 mg at 10/23/22 0826   atropine 1 % ophthalmic solution 1 drop  1 drop Sublingual QID [name], MD   1 drop at 10/23/22 0826   benztropine (COGENTIN) tablet 0.5 mg  0.5 mg Oral BID Eliyohu Class T, MD   0.5 mg at 10/23/22 0826   clonazePAM (KLONOPIN) tablet 1 mg  1  mg Oral TID PRN He, [name], MD   1 mg at 10/14/22 1105   cloZAPine (CLOZARIL) tablet 300 mg  300 mg Oral QHS Levar Fayson T, MD   300 mg at 10/22/22 2112   diphenhydrAMINE (BENADRYL) capsule 50 mg  50 mg Oral Q6H PRN Shirlee Whitmire T, MD   50 mg at 09/30/22 2119   Or   diphenhydrAMINE (BENADRYL) injection 50 mg  50 mg Intramuscular Q6H PRN Tayven Renteria T, MD       gabapentin (NEURONTIN) capsule 300 mg  300 mg Oral TID Destaney Sarkis T, MD   300 mg at 10/23/22 1016   haloperidol (HALDOL) tablet 2 mg  2 mg Oral TID Amardeep Beckers T, MD   2 mg at 10/23/22 1015   haloperidol (HALDOL) tablet 5 mg  5 mg Oral Q6H PRN Abreanna Drawdy T, MD   5 mg at 09/17/22 1532   Or   haloperidol lactate (HALDOL) injection 5 mg  5 mg Intramuscular Q6H PRN Kaylanie Capili T, MD       lisinopril  (ZESTRIL) tablet 5 mg  5 mg Oral Daily Dorance Spink T, MD   5 mg at 10/22/22 8889   magnesium hydroxide (MILK OF MAGNESIA) suspension 30 mL  30 mL Oral Daily PRN Romelle Reiley T, MD   30 mL at 09/15/22 1047   metoprolol succinate (TOPROL-XL) 24 hr tablet 25 mg  25 mg Oral Daily Sharda Keddy T, MD   25 mg at 10/23/22 0826   ondansetron (ZOFRAN) tablet 4 mg  4 mg Oral Q8H PRN Lerry Liner M, NP   4 mg at 09/06/22 2320   paliperidone (INVEGA) 24 hr tablet 6 mg  6 mg Oral QHS Jontrell Bushong T, MD   6 mg at 10/22/22 2112   senna-docusate (Senokot-S) tablet 2 tablet  2 tablet Oral QHS PRN Sarina Ill, DO   2 tablet at 09/15/22 2110   temazepam (RESTORIL) capsule 15 mg  15 mg Oral QHS Barnett Elzey T, MD   15 mg at 10/22/22 2112   ziprasidone (GEODON) injection 20 mg  20 mg Intramuscular Q12H PRN Winola Drum, [name], MD   20 mg at 07/08/22 1453    Lab Results: No results found for this or any previous visit (from the past 48 hour(s)).  Blood Alcohol level:  Lab Results  Component Value Date   ETH <10 07/20/2021    Metabolic Disorder Labs: Lab Results  Component Value Date   HGBA1C 5.5 04/10/2022   MPG 111.15 04/10/2022   No results found for: "PROLACTIN" Lab Results  Component Value Date   CHOL 167 11/20/2021   TRIG 107 11/20/2021   HDL 26 (L) 11/20/2021   CHOLHDL 6.4 11/20/2021   VLDL 21 11/20/2021   LDLCALC 120 (H) 11/20/2021    Physical Findings: AIMS: Facial and Oral Movements Muscles of Facial Expression: None, normal Lips and Perioral Area: None, normal Jaw: None, normal Tongue: None, normal,Extremity Movements Upper (arms, wrists, hands, fingers): None, normal Lower (legs, knees, ankles, toes): None, normal, Trunk Movements Neck, shoulders, hips: None, normal, Overall Severity Severity of abnormal movements (highest score from questions above): None, normal Incapacitation due to abnormal movements: None, normal Patient's awareness of abnormal movements (rate  only patient's report): No Awareness, Dental Status Current problems with teeth and/or dentures?: No Does patient usually wear dentures?: No  CIWA:    COWS:     Musculoskeletal: Strength & Muscle Tone: within normal limits Gait & Station: normal Patient leans:  N/A  Psychiatric Specialty Exam:  Presentation  General Appearance:  Appropriate for Environment; Casual; Neat; Well Groomed  Eye Contact: Fair  Speech: Slow  Speech Volume: Decreased  Handedness: Left   Mood and Affect  Mood: Anxious  Affect: Congruent   Thought Process  Thought Processes: Goal Directed  Descriptions of Associations:Circumstantial  Orientation:Partial  Thought Content:Scattered  History of Schizophrenia/Schizoaffective disorder:Yes  Duration of Psychotic Symptoms:Greater than six months  Hallucinations:No data recorded Ideas of Reference:None  Suicidal Thoughts:No data recorded Homicidal Thoughts:No data recorded  Sensorium  Memory: Remote Good  Judgment: Fair  Insight: Fair   Art therapist  Concentration: Fair  Attention Span: Fair  Recall: Fair  Fund of Knowledge: Fair  Language: Fair   Psychomotor Activity  Psychomotor Activity:No data recorded  Assets  Assets:No data recorded  Sleep  Sleep:No data recorded   Physical Exam: Physical Exam Vitals reviewed.  Constitutional:      Appearance: Normal appearance.  HENT:     Head: Normocephalic and atraumatic.     Mouth/Throat:     Pharynx: Oropharynx is clear.  Eyes:     Pupils: Pupils are equal, round, and reactive to light.  Cardiovascular:     Rate and Rhythm: Normal rate and regular rhythm.  Pulmonary:     Effort: Pulmonary effort is normal.     Breath sounds: Normal breath sounds.  Abdominal:     General: Abdomen is flat.     Palpations: Abdomen is soft.  Musculoskeletal:        General: Normal range of motion.  Skin:    General: Skin is warm and dry.  Neurological:      General: No focal deficit present.     Mental Status: He is alert. Mental status is at baseline.  Psychiatric:        Mood and Affect: Mood normal.        Thought Content: Thought content normal.    Review of Systems  Unable to perform ROS: Language   Blood pressure 98/71, pulse (!) 101, temperature (!) 97.5 F (36.4 C), temperature source Oral, resp. rate 18, height 5\' 8"  (1.727 m), weight 85.8 kg, SpO2 99 %. Body mass index is 28.76 kg/m.   Treatment Plan Summary: Plan no change to medication or treatment plan.  Reviewed with patient that we are still awaiting financing for discharge planning  , MD 10/23/2022, 11:55 AM

## 2022-10-23 NOTE — [facility] Group Notes (Signed)
[facility] Group Notes:  (Nursing/MHT/Case Management/Adjunct)  Date:  10/23/2022  Time:  8:22 PM  Type of Therapy:   Wrap up  Participation Level:  Did Not Attend    Summary of Progress/Problems:  Adam [name] 10/23/2022, 8:22 PM

## 2022-10-23 NOTE — [facility] Counselor (Signed)
CSW received call from Ms Annice Pih who reports that to her knowledge group home owner has not completed or turned in the paperwork.  She reports that when she went to have a conversation with Tammy the door was locked. Reports that she will keep this CSW posted.  CSW attempted to contact Tammy, group home owner and left HIPAA compliant voicemail.   [name], MSW, LCSW 10/23/2022 2:48 PM

## 2022-10-23 NOTE — Progress Notes (Signed)
Recreation Therapy Notes    Date: 10/23/2022   Time: 10:50 am      Location: Craft room    Behavioral response: N/A   Intervention Topic: Values  Discussion/Intervention: Patient refused to attend group.    Clinical Observations/Feedback:  Patient refused to attend group.    Quitman Norberto LRT/CTRS        Evertte Sones 10/23/2022 11:30 AM

## 2022-10-23 NOTE — Progress Notes (Signed)
Patient is A+O x2 sometimes 3. He denies SI/HI/AVH. Pain 0/10. Denies depression and anxiety. Patient presents with irritability when staff asks him to take his medication. "No, no no!" Patients is medication compliant despite appearing to be resistant. Patient will randomly yell throughout the hallways but is very redirectable.   New medication Gean Birchwood ordered and adm by nursing. Patient appeared to tolerate well with no issues to report.  Q15 minute unit checks in place.

## 2022-10-23 NOTE — Plan of Care (Signed)
  Problem: Education: Goal: Ability to state activities that reduce stress will improve 10/23/2022 1728 by Luane School, RN Outcome: Progressing 10/23/2022 1728 by Luane School, RN Outcome: Progressing   Problem: Coping: Goal: Ability to identify and develop effective coping behavior will improve 10/23/2022 1728 by Luane School, RN Outcome: Progressing 10/23/2022 1728 by Luane School, RN Outcome: Progressing   Problem: Clinical Measurements: Goal: Cardiovascular complication will be avoided 10/23/2022 1728 by Luane School, RN Outcome: Progressing 10/23/2022 1728 by Luane School, RN Outcome: Progressing   Problem: Nutrition: Goal: Adequate nutrition will be maintained 10/23/2022 1728 by Luane School, RN Outcome: Progressing 10/23/2022 1728 by Luane School, RN Outcome: Progressing

## 2022-10-23 NOTE — Plan of Care (Signed)
  Problem: Education: Goal: Ability to state activities that reduce stress will improve Outcome: Progressing   Problem: Coping: Goal: Ability to identify and develop effective coping behavior will improve Outcome: Progressing   Problem: Clinical Measurements: Goal: Cardiovascular complication will be avoided Outcome: Progressing   Problem: Nutrition: Goal: Adequate nutrition will be maintained Outcome: Progressing

## 2022-10-23 NOTE — Group Note (Signed)
[facility] LCSW Group Therapy Note   Group Date: 10/23/2022 Start Time: 1300 End Time: 1400  Type of Therapy/Topic:  Group Therapy:  Feelings about Diagnosis  Participation Level:  None    Description of Group:    This group will allow patients to explore their thoughts and feelings about diagnoses they have received. Patients will be guided to explore their level of understanding and acceptance of these diagnoses. Facilitator will encourage patients to process their thoughts and feelings about the reactions of others to their diagnosis, and will guide patients in identifying ways to discuss their diagnosis with significant others in their lives. This group will be process-oriented, with patients participating in exploration of their own experiences as well as giving and receiving support and challenge from other group members.   Therapeutic Goals: 1. Patient will demonstrate understanding of diagnosis as evidence by identifying two or more symptoms of the disorder:  2. Patient will be able to express two feelings regarding the diagnosis 3. Patient will demonstrate ability to communicate their needs through discussion and/or role plays  Summary of Patient Progress: Patient came into group area but then left and did this the entire time that room was open.    Therapeutic Modalities:   Cognitive Behavioral Therapy Brief Therapy Feelings Identification    Glenis Smoker, LCSW

## 2022-10-24 NOTE — Progress Notes (Signed)
[facility] MD Progress Note  10/24/2022 12:42 PM Adam [name]  MRN:  [id number] Subjective: Patient seen and chart reviewed.  Patient was schizophrenia.  No new complaints.  No change to behavior.  Compliant with medicine.  He did get his long-acting Invega shot yesterday. 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 ([place])   Stroke ([place])   HTN (hypertension)   HLD (hyperlipidemia)   CAD (coronary artery disease)   Chronic systolic CHF (congestive heart failure) (HCC)   Anxiety   Infection of left earlobe  Total Time spent with patient: 30 minutes  Past Psychiatric History: Past history of schizophrenia  Past Medical History:  Past Medical History:  Diagnosis Date   Myocardial infarction ([place])    Schizophrenia ([place])    Stroke ([place])     Past Surgical History:  Procedure Laterality Date   NO PAST SURGERIES     Family History:  Family History  Problem Relation Age of Onset   Hypertension Mother    Family Psychiatric  History: No change Social History:  Social History   Substance and Sexual Activity  Alcohol Use Not Currently     Social History   Substance and Sexual Activity  Drug Use Not Currently    Social History   Socioeconomic History   Marital status: Single    Spouse name: Not on file   Number of children: Not on file   Years of education: Not on file   Highest education level: Not on file  Occupational History   Not on file  Tobacco Use   Smoking status: Never    Passive exposure: Never   Smokeless tobacco: Never  Vaping Use   Vaping Use: Unknown  Substance and Sexual Activity   Alcohol use: Not Currently   Drug use: Not Currently   Sexual activity: Not Currently  Other Topics Concern   Not on file  Social History Narrative   Not on  file   Social Determinants of Health   Financial Resource Strain: Not on file  Food Insecurity: Not on file  Transportation Needs: Not on file  Physical Activity: Not on file  Stress: Not on file  Social Connections: Not on file   Additional Social History:  Specify valuables returned:  (none)                      Sleep: Fair  Appetite:  Fair  Current Medications: Current Facility-Administered Medications  Medication Dose Route Frequency Provider Last Rate Last Admin   acetaminophen (TYLENOL) tablet 650 mg  650 mg Oral Q6H PRN Zondra Lawlor T, MD   650 mg at 10/14/22 2328   alum & mag hydroxide-simeth (MAALOX/MYLANTA) 200-200-20 MG/5ML suspension 30 mL  30 mL Oral Q4H PRN Naira Standiford T, MD   30 mL at 04/26/22 [id number]   aspirin EC tablet 81 mg  81 mg Oral Daily Tobi Leinweber, [name], MD   81 mg at 10/23/22 0825   atorvastatin (LIPITOR) tablet 20 mg  20 mg Oral Daily Chanan Detwiler T, MD   20 mg at 10/23/22 0826   atropine 1 % ophthalmic solution 1 drop  1 drop Sublingual QID [name], MD   1 drop at 10/23/22 2118   benztropine (COGENTIN) tablet 0.5 mg  0.5 mg Oral BID Sian Rockers, [name], MD   0.5 mg at 10/23/22 1718  clonazePAM (KLONOPIN) tablet 1 mg  1 mg Oral TID PRN He, [name], MD   1 mg at 10/14/22 1105   cloZAPine (CLOZARIL) tablet 300 mg  300 mg Oral QHS Keaun Schnabel T, MD   300 mg at 10/23/22 2118   diphenhydrAMINE (BENADRYL) capsule 50 mg  50 mg Oral Q6H PRN Keerstin Bjelland T, MD   50 mg at 09/30/22 2119   Or   diphenhydrAMINE (BENADRYL) injection 50 mg  50 mg Intramuscular Q6H PRN Cassey Hurrell T, MD       gabapentin (NEURONTIN) capsule 300 mg  300 mg Oral TID Atara Paterson T, MD   300 mg at 10/23/22 2117   haloperidol (HALDOL) tablet 2 mg  2 mg Oral TID Elenor Wildes, [name], MD   2 mg at 10/23/22 2117   haloperidol (HALDOL) tablet 5 mg  5 mg Oral Q6H PRN Zarianna Dicarlo T, MD   5 mg at 09/17/22 1532   Or   haloperidol lactate (HALDOL) injection 5 mg  5 mg Intramuscular Q6H PRN  Caliya Narine T, MD       lisinopril (ZESTRIL) tablet 5 mg  5 mg Oral Daily Ace Bergfeld T, MD   5 mg at 10/22/22 5909   magnesium hydroxide (MILK OF MAGNESIA) suspension 30 mL  30 mL Oral Daily PRN Juliyah Mergen T, MD   30 mL at 09/15/22 1047   metoprolol succinate (TOPROL-XL) 24 hr tablet 25 mg  25 mg Oral Daily Airyonna Franklyn T, MD   25 mg at 10/23/22 0826   ondansetron (ZOFRAN) tablet 4 mg  4 mg Oral Q8H PRN Lerry Liner M, NP   4 mg at 09/06/22 2320   paliperidone (INVEGA SUSTENNA) injection 156 mg  156 mg Intramuscular Q28 days Shayma Pfefferle T, MD   156 mg at 10/23/22 1715   paliperidone (INVEGA) 24 hr tablet 6 mg  6 mg Oral QHS Isahi Godwin T, MD   6 mg at 10/23/22 2117   senna-docusate (Senokot-S) tablet 2 tablet  2 tablet Oral QHS PRN Sarina Ill, DO   2 tablet at 09/15/22 2110   temazepam (RESTORIL) capsule 15 mg  15 mg Oral QHS Amando Chaput T, MD   15 mg at 10/23/22 2118   ziprasidone (GEODON) injection 20 mg  20 mg Intramuscular Q12H PRN Syrena Burges, [name], MD   20 mg at 07/08/22 1453    Lab Results: No results found for this or any previous visit (from the past 48 hour(s)).  Blood Alcohol level:  Lab Results  Component Value Date   ETH <10 07/20/2021    Metabolic Disorder Labs: Lab Results  Component Value Date   HGBA1C 5.5 04/10/2022   MPG 111.15 04/10/2022   No results found for: "PROLACTIN" Lab Results  Component Value Date   CHOL 167 11/20/2021   TRIG 107 11/20/2021   HDL 26 (L) 11/20/2021   CHOLHDL 6.4 11/20/2021   VLDL 21 11/20/2021   LDLCALC 120 (H) 11/20/2021    Physical Findings: AIMS: Facial and Oral Movements Muscles of Facial Expression: None, normal Lips and Perioral Area: None, normal Jaw: None, normal Tongue: None, normal,Extremity Movements Upper (arms, wrists, hands, fingers): None, normal Lower (legs, knees, ankles, toes): None, normal, Trunk Movements Neck, shoulders, hips: None, normal, Overall Severity Severity of abnormal  movements (highest score from questions above): None, normal Incapacitation due to abnormal movements: None, normal Patient's awareness of abnormal movements (rate only patient's report): No Awareness, Dental Status Current problems with teeth and/or  dentures?: No Does patient usually wear dentures?: No  CIWA:    COWS:     Musculoskeletal: Strength & Muscle Tone: within normal limits Gait & Station: normal Patient leans: N/A  Psychiatric Specialty Exam:  Presentation  General Appearance:  Appropriate for Environment; Casual; Neat; Well Groomed  Eye Contact: Fair  Speech: Slow  Speech Volume: Decreased  Handedness: Left   Mood and Affect  Mood: Anxious  Affect: Congruent   Thought Process  Thought Processes: Goal Directed  Descriptions of Associations:Circumstantial  Orientation:Partial  Thought Content:Scattered  History of Schizophrenia/Schizoaffective disorder:Yes  Duration of Psychotic Symptoms:Greater than six months  Hallucinations:No data recorded Ideas of Reference:None  Suicidal Thoughts:No data recorded Homicidal Thoughts:No data recorded  Sensorium  Memory: Remote Good  Judgment: Fair  Insight: Fair   Art therapist  Concentration: Fair  Attention Span: Fair  Recall: [employer] of Knowledge: Fair  Language: Fair   Psychomotor Activity  Psychomotor Activity:No data recorded  Assets  Assets:No data recorded  Sleep  Sleep:No data recorded   Physical Exam: Physical Exam Vitals and nursing note reviewed.  Constitutional:      Appearance: Normal appearance.  HENT:     Head: Normocephalic and atraumatic.     Mouth/Throat:     Pharynx: Oropharynx is clear.  Eyes:     Pupils: Pupils are equal, round, and reactive to light.  Cardiovascular:     Rate and Rhythm: Normal rate and regular rhythm.  Pulmonary:     Effort: Pulmonary effort is normal.     Breath sounds: Normal breath sounds.  Abdominal:      General: Abdomen is flat.     Palpations: Abdomen is soft.  Musculoskeletal:        General: Normal range of motion.  Skin:    General: Skin is warm and dry.  Neurological:     General: No focal deficit present.     Mental Status: He is alert. Mental status is at baseline.  Psychiatric:        Attention and Perception: Attention normal.        Mood and Affect: Mood normal. Affect is blunt.        Speech: He is noncommunicative.    Review of Systems  Unable to perform ROS: Language   Blood pressure 98/71, pulse (!) 101, temperature (!) 97.5 F (36.4 C), temperature source Oral, resp. rate 18, height 5\' 8"  (1.727 m), weight 85.8 kg, SpO2 99 %. Body mass index is 28.76 kg/m.   Treatment Plan Summary: Plan patient is stable and awaiting discharge.  Reviewed situation with treatment team.  Apparently mother has now agreed in principle to release funds but there is still some paperwork being done to try and get everything in place so that he can go to a group home.  Supportive therapy and encouragement to patient.  , MD 10/24/2022, 12:42 PM

## 2022-10-24 NOTE — Group Note (Signed)
[facility] LCSW Group Therapy Note   Group Date: 10/24/2022 Start Time: 1400 End Time: 1500   Type of Therapy/Topic:  Group Therapy:  Emotion Regulation  Participation Level:  Did Not Attend   Mood:  Description of Group:    The purpose of this group is to assist patients in learning to regulate negative emotions and experience positive emotions. Patients will be guided to discuss ways in which they have been vulnerable to their negative emotions. These vulnerabilities will be juxtaposed with experiences of positive emotions or situations, and patients challenged to use positive emotions to combat negative ones. Special emphasis will be placed on coping with negative emotions in conflict situations, and patients will process healthy conflict resolution skills.  Therapeutic Goals: Patient will identify two positive emotions or experiences to reflect on in order to balance out negative emotions:  Patient will label two or more emotions that they find the most difficult to experience:  Patient will be able to demonstrate positive conflict resolution skills through discussion or role plays:   Summary of Patient Progress:   Patient did not attend group, though encouraged to do so by this clinician.     Therapeutic Modalities:   Cognitive Behavioral Therapy Feelings Identification Dialectical Behavioral Therapy   Theta Leaf J Turner Kunzman, LCSW 

## 2022-10-24 NOTE — BHH Counselor (Signed)
CSW team attended placement meeting for the patient. Attendees for the meeting included [facility] BMU team, [facility] Supervisor, Mardella Layman and [place] both with [facility] and Maryelizabeth Kaufmann the Associate Professor.   It was discussed in the meeting how communication remains difficult with Ms Annice Pih and Ms Babette Relic.  Consideration for other potential placements to be presented at the meeting next week.  Also [facility] will request their staff member, Koleen Nimrod, who has been working on placement to attend this meeting as well.  It was proposed by Maryelizabeth Kaufmann, that a family meeting be held to bring mother into the discussion and have her input on placement.  This plan remains in development stages.  Mardella Layman with [facility] is to attempt to reach out to patient's mother to see if she can assist in bridging the gap in communication with Miss Tammy and Ms Annice Pih, the preferred placement at this time.   [facility] CSW team to reach out to [facility] in [place] to see about bed availability and possible referral.  This Clinical research associate to touch base with mother prior to making referral.  [name], MSW, LCSW 10/24/2022 4:19 PM

## 2022-10-24 NOTE — Progress Notes (Signed)
Patient is still sleeping, so this writer will administer scheduled medications once he wakes up. 

## 2022-10-24 NOTE — Plan of Care (Signed)
D- Patient alert and oriented to person, place, and situation. Patient initially presented in an angry/irritable mood on assessment and this writer is not sure why. Patient came into the medication room to take his medication and then spit them all out in the water cup and tried to throw it away. This writer was able to get the cup back from patient and get his medication out, before dissolving, and gave them to him again to take. Patient became more agitated and started yelling "no". This Clinical research associate explained that the medication is like this because he spit them into a cup of water. Although patient didn't like it, he did take them and walked off back to his room. Patient is childlike and due to a past stroke, he has expressive aphasia. Patient denied SI, HI, AVH, and pain at this time. Patient also denied any signs/symptoms of depression and anxiety. Patient had no stated goals for today.  A- Scheduled medications administered to patient, per MD orders. Support and encouragement provided.  Routine safety checks conducted every 15 minutes.  Patient informed to notify staff with problems or concerns.  R- No adverse drug reactions noted. Patient contracts for safety at this time. Patient remains safe at this time.  Problem: Education: Goal: Ability to state activities that reduce stress will improve Outcome: Not Progressing   Problem: Coping: Goal: Ability to identify and develop effective coping behavior will improve Outcome: Not Progressing   Problem: Self-Concept: Goal: Ability to identify factors that promote anxiety will improve Outcome: Not Progressing Goal: Level of anxiety will decrease Outcome: Not Progressing Goal: Ability to modify response to factors that promote anxiety will improve Outcome: Not Progressing   Problem: Education: Goal: Knowledge of General Education information will improve Description: Including pain rating scale, medication(s)/side effects and non-pharmacologic  comfort measures Outcome: Not Progressing   Problem: Health Behavior/Discharge Planning: Goal: Ability to manage health-related needs will improve Outcome: Not Progressing   Problem: Clinical Measurements: Goal: Ability to maintain clinical measurements within normal limits will improve Outcome: Not Progressing Goal: Will remain free from infection Outcome: Not Progressing Goal: Diagnostic test results will improve Outcome: Not Progressing Goal: Respiratory complications will improve Outcome: Not Progressing Goal: Cardiovascular complication will be avoided Outcome: Not Progressing   Problem: Activity: Goal: Risk for activity intolerance will decrease Outcome: Not Progressing   Problem: Nutrition: Goal: Adequate nutrition will be maintained Outcome: Not Progressing   Problem: Coping: Goal: Level of anxiety will decrease Outcome: Not Progressing   Problem: Elimination: Goal: Will not experience complications related to bowel motility Outcome: Not Progressing Goal: Will not experience complications related to urinary retention Outcome: Not Progressing   Problem: Pain Managment: Goal: General experience of comfort will improve Outcome: Not Progressing   Problem: Safety: Goal: Ability to remain free from injury will improve Outcome: Not Progressing   Problem: Skin Integrity: Goal: Risk for impaired skin integrity will decrease Outcome: Not Progressing   Problem: Activity: Goal: Interest or engagement in leisure activities will improve Outcome: Not Progressing   Problem: Coping: Goal: Coping ability will improve Outcome: Not Progressing   Problem: Education: Goal: Will be free of psychotic symptoms Outcome: Not Progressing

## 2022-10-24 NOTE — Progress Notes (Signed)
Patient calm and cooperative during assessment denying SI/HI/AVH. Patient observed interacting appropriately with staff and peers on the unit. Patient compliant with medication administration per MD orders. Pt being monitored Q 15 minutes for safety per unit protocol, remains safe on the unit  

## 2022-10-24 NOTE — Progress Notes (Addendum)
Recreation Therapy Notes  Date: 10/24/2022  Time: 10:45 am    Location: Craft room   Behavioral response: Appropriate   Intervention Topic:  Self-care    Discussion/Intervention:  Group content today was focused on Self-Care. The group defined self-care and some positive ways they care for themselves. Individuals expressed ways and reasons why they neglected any self-care in the past. Patients described ways to improve self-care in the future. The group explained what could happen if they did not do any self-care activities at all. The group participated in the intervention "self-care assessment" where they had a chance to discover some of their weaknesses and strengths in self- care. Patient came up with a self-care plan to improve themselves in the future.  Clinical Observations/Feedback: Patient came to group late and was focused on what peers and staff had to say about self care. Individual was social with peers and staff while participating in the intervention.        Adam [name] LRT/CTRS         Adam [name] 10/24/2022 1:15 PM

## 2022-10-24 NOTE — Progress Notes (Addendum)
Patient's BP was 87/60 when this writer obtained a set of vitals on him. This Clinical research associate held patient's scheduled Metoprolol. Patient was encouraged to increase his fluid intake. Patient would not allow this writer to check his BP again because he was upset for some reason. MD will be notified.

## 2022-10-25 LAB — CBC WITH DIFFERENTIAL/PLATELET
Abs Immature Granulocytes: 0.01 10*3/uL (ref 0.00–0.07)
Basophils Absolute: 0.1 10*3/uL (ref 0.0–0.1)
Basophils Relative: 2 %
Eosinophils Absolute: 0 10*3/uL (ref 0.0–0.5)
Eosinophils Relative: 1 %
HCT: 36.1 % — ABNORMAL LOW (ref 39.0–52.0)
Hemoglobin: 12.7 g/dL — ABNORMAL LOW (ref 13.0–17.0)
Immature Granulocytes: 0 %
Lymphocytes Relative: 23 %
Lymphs Abs: 1.1 10*3/uL (ref 0.7–4.0)
MCH: 31.5 pg (ref 26.0–34.0)
MCHC: 35.2 g/dL (ref 30.0–36.0)
MCV: 89.6 fL (ref 80.0–100.0)
Monocytes Absolute: 0.5 10*3/uL (ref 0.1–1.0)
Monocytes Relative: 9 %
Neutro Abs: 3.1 10*3/uL (ref 1.7–7.7)
Neutrophils Relative %: 65 %
Platelets: 170 10*3/uL (ref 150–400)
RBC: 4.03 MIL/uL — ABNORMAL LOW (ref 4.22–5.81)
RDW: 13.2 % (ref 11.5–15.5)
WBC: 4.8 10*3/uL (ref 4.0–10.5)
nRBC: 0 % (ref 0.0–0.2)

## 2022-10-25 NOTE — Progress Notes (Signed)
[facility] MD Progress Note  10/25/2022 7:35 AM Adam [name]  MRN:  [id number]  Subjective: Patient seen on rounds today after notes were reviewed and discussed in the morning meeting. He continues to remain stable and is compliant with current treatment plan. Patient moves around unit without any issues. He smiles and says "no" often to questions, difficult to discern issues unless he yells ans starts pointing which he has not done.  No unusual behaviors, awaiting group home placement.  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  History of major stroke causing dense aphasia  Past Medical History:  Past Medical History:  Diagnosis Date   Myocardial infarction ([place])    Schizophrenia ([place])    Stroke ([place])     Past Surgical History:  Procedure Laterality Date   NO PAST SURGERIES     Family History:  Family History  Problem Relation Age of Onset   Hypertension Mother    Family Psychiatric  History: See previous Social History:  Social History   Substance and Sexual Activity  Alcohol Use Not Currently     Social History   Substance and Sexual Activity  Drug Use Not Currently    Social History   Socioeconomic History   Marital status: Single    Spouse name: Not on file   Number of children: Not on file   Years of education: Not on file   Highest education level: Not on file  Occupational History   Not  on file  Tobacco Use   Smoking status: Never    Passive exposure: Never   Smokeless tobacco: Never  Vaping Use   Vaping Use: Unknown  Substance and Sexual Activity   Alcohol use: Not Currently   Drug use: Not Currently   Sexual activity: Not Currently  Other Topics Concern   Not on file  Social History Narrative   Not on file   Social Determinants of Health   Financial Resource Strain: Not on file  Food Insecurity: Not on file  Transportation Needs: Not on file  Physical Activity: Not on file  Stress: Not on file  Social Connections: Not on file   Additional Social History:  Specify valuables returned:  (none)                      Sleep: Fair  Appetite:  Fair  Current Medications: Current Facility-Administered Medications  Medication Dose Route Frequency Provider Last Rate Last Admin   acetaminophen (TYLENOL) tablet 650 mg  650 mg Oral Q6H PRN [name], [name], MD   650 mg at 10/14/22 2328   alum & mag hydroxide-simeth (MAALOX/MYLANTA) 200-200-20 MG/5ML suspension 30 mL  30 mL Oral Q4H PRN [name], [name], MD   30 mL at 04/26/22 [id number]   aspirin EC tablet 81 mg  81 mg Oral Daily [name], [name], MD   81 mg at 10/24/22 1249   atorvastatin (LIPITOR) tablet 20 mg  20 mg Oral Daily [name], [name], MD  20 mg at 10/24/22 1249   atropine 1 % ophthalmic solution 1 drop  1 drop Sublingual QID [name], MD   1 drop at 10/24/22 2110   benztropine (COGENTIN) tablet 0.5 mg  0.5 mg Oral BID [name], [name], MD   0.5 mg at 10/24/22 1621   clonazePAM (KLONOPIN) tablet 1 mg  1 mg Oral TID PRN He, [name], MD   1 mg at 10/14/22 1105   cloZAPine (CLOZARIL) tablet 300 mg  300 mg Oral QHS [name], [name], MD   300 mg at 10/24/22 2108   diphenhydrAMINE (BENADRYL) capsule 50 mg  50 mg Oral Q6H PRN [name], [name], MD   50 mg at 09/30/22 2119   Or   diphenhydrAMINE (BENADRYL) injection 50 mg  50 mg Intramuscular Q6H PRN [name], [name], MD       gabapentin (NEURONTIN) capsule 300 mg   300 mg Oral TID [name], [name], MD   300 mg at 10/24/22 2108   haloperidol (HALDOL) tablet 2 mg  2 mg Oral TID [name], [name], MD   2 mg at 10/24/22 2108   haloperidol (HALDOL) tablet 5 mg  5 mg Oral Q6H PRN [name], [name], MD   5 mg at 09/17/22 1532   Or   haloperidol lactate (HALDOL) injection 5 mg  5 mg Intramuscular Q6H PRN [name], [name], MD       lisinopril (ZESTRIL) tablet 5 mg  5 mg Oral Daily [name], [name], MD   5 mg at 10/24/22 1249   magnesium hydroxide (MILK OF MAGNESIA) suspension 30 mL  30 mL Oral Daily PRN [name], [name], MD   30 mL at 09/15/22 1047   metoprolol succinate (TOPROL-XL) 24 hr tablet 25 mg  25 mg Oral Daily [name], [name], MD   25 mg at 10/23/22 0826   ondansetron (ZOFRAN) tablet 4 mg  4 mg Oral Q8H PRN Lerry Liner M, NP   4 mg at 09/06/22 2320   paliperidone (INVEGA SUSTENNA) injection 156 mg  156 mg Intramuscular Q28 days [name], [name], MD   156 mg at 10/23/22 1715   paliperidone (INVEGA) 24 hr tablet 6 mg  6 mg Oral QHS [name], [name], MD   6 mg at 10/24/22 2108   senna-docusate (Senokot-S) tablet 2 tablet  2 tablet Oral QHS PRN Sarina Ill, DO   2 tablet at 09/15/22 2110   temazepam (RESTORIL) capsule 15 mg  15 mg Oral QHS [name], [name], MD   15 mg at 10/24/22 2108   ziprasidone (GEODON) injection 20 mg  20 mg Intramuscular Q12H PRN [name], [name], MD   20 mg at 07/08/22 1453    Lab Results: No results found for this or any previous visit (from the past 48 hour(s)).    Blood Alcohol level:  Lab Results  Component Value Date   ETH <10 07/20/2021    Metabolic Disorder Labs: Lab Results  Component Value Date   HGBA1C 5.5 04/10/2022   MPG 111.15 04/10/2022   No results found for: "PROLACTIN" Lab Results  Component Value Date   CHOL 167 11/20/2021   TRIG 107 11/20/2021   HDL 26 (L) 11/20/2021   CHOLHDL 6.4 11/20/2021   VLDL 21 11/20/2021   LDLCALC 120 (H) 11/20/2021    Physical Findings: AIMS: Facial and Oral  Movements Muscles of Facial Expression: None, normal Lips and Perioral Area: None, normal Jaw: None, normal Tongue: None, normal,Extremity Movements Upper (arms, wrists,  hands, fingers): None, normal Lower (legs, knees, ankles, toes): None, normal, Trunk Movements Neck, shoulders, hips: None, normal, Overall Severity Severity of abnormal movements (highest score from questions above): None, normal Incapacitation due to abnormal movements: None, normal Patient's awareness of abnormal movements (rate only patient's report): No Awareness, Dental Status Current problems with teeth and/or dentures?: No Does patient usually wear dentures?: No  CIWA:    COWS:     Musculoskeletal: Strength & Muscle Tone: within normal limits Gait & Station: normal Patient leans: N/A  Psychiatric Specialty Exam: Physical Exam Vitals and nursing note reviewed.  Constitutional:      Appearance: Normal appearance.  HENT:     Head: Normocephalic and atraumatic.     Nose: Nose normal.  Pulmonary:     Effort: Pulmonary effort is normal.  Musculoskeletal:        General: Normal range of motion.     Cervical back: Normal range of motion.  Neurological:     General: No focal deficit present.     Mental Status: He is alert and oriented to person, place, and time.  Psychiatric:        Attention and Perception: He is inattentive.        Mood and Affect: Affect is blunt.        Speech: Speech normal.        Behavior: Behavior normal. Behavior is cooperative.        Thought Content: Thought content normal.        Cognition and Memory: Cognition is impaired.        Judgment: Judgment is impulsive.     Review of Systems  Unable to perform ROS: Language  Psychiatric/Behavioral:  The patient is nervous/anxious.   All other systems reviewed and are negative.   Blood pressure 120/79, pulse (!) 117, temperature 98.7 F (37.1 C), temperature source Oral, resp. rate 17, height 5\' 8"  (1.727 m), weight 85.8 kg,  SpO2 99 %.Body mass index is 28.76 kg/m.  General Appearance: Casual  Eye Contact:  Good  Speech:  Garbled  Volume:  Normal  Mood:  Euthymic  Affect:  Congruent  Thought Process:  seems coherent  Orientation:  Full (Time, Place, and Person)  Thought Content:  UTA, difficult to understand  Suicidal Thoughts:  No  Homicidal Thoughts:  No  Memory:  Immediate;   Fair Recent;   Fair Remote;   Fair  Judgement:  Fair  Insight:  UTA  Psychomotor Activity:  Normal  Concentration:  Concentration: Fair and Attention Span: Fair  Recall:  [place] of Knowledge:  UTA  Language:  Poor  Akathisia:  No  Handed:  Right  AIMS (if indicated):     Assets:  Leisure Time Physical Health Resilience Social Support  ADL's:  Intact  Cognition:  Impaired,  Mild  Sleep:  Number of Hours: 5.5      Physical Exam: Physical Exam Vitals and nursing note reviewed.  Constitutional:      Appearance: Normal appearance.  HENT:     Head: Normocephalic and atraumatic.     Nose: Nose normal.  Pulmonary:     Effort: Pulmonary effort is normal.  Musculoskeletal:        General: Normal range of motion.     Cervical back: Normal range of motion.  Neurological:     General: No focal deficit present.     Mental Status: He is alert and oriented to person, place, and time.  Psychiatric:  Attention and Perception: He is inattentive.        Mood and Affect: Affect is blunt.        Speech: Speech normal.        Behavior: Behavior normal. Behavior is cooperative.        Thought Content: Thought content normal.        Cognition and Memory: Cognition is impaired.        Judgment: Judgment is impulsive.    Review of Systems  Unable to perform ROS: Language  Psychiatric/Behavioral:  The patient is nervous/anxious.   All other systems reviewed and are negative.  Blood pressure 120/79, pulse (!) 117, temperature 98.7 F (37.1 C), temperature source Oral, resp. rate 17, height 5\' 8"  (1.727 m), weight  85.8 kg, SpO2 99 %. Body mass index is 28.76 kg/m.   Treatment Plan Summary: Medication management and Plan no change to medication.  Offered support for the patient and encouraged him to let us know if there is something specific we can do to help.  Potential placement found for patient - performed TB skin test waiting for results and COVID testing was negative. Schizophrenia: Haldol 2 mg TID Clozaril 300 mg at bedtime Invega 156 mg monthly plus Invega 6 mg daily  Anxiety Gabapentin 300 mg TID Klonopin 1 mg TID PRN  Insomnia Restoril 15 mg daily at bedtime  EPS: Cogentin 0.5 mg BID  Waylan Boga, NP 10/25/2022, 7:35 AM

## 2022-10-25 NOTE — Progress Notes (Signed)
Patient is still asleep, so this writer will administer scheduled medications once he wakes up. NP will be notified.

## 2022-10-25 NOTE — Plan of Care (Signed)
D- Patient alert and oriented to person, place, and situation. Patient woke up and presented in a much more pleasant mood than he did on yesterday morning. Patient filled out his self-inventory and reported that he slept good last night and had no complaints to voice at this time. Although patient endorsed SI on his self-inventory, he denied SI, HI, AVH, and pain to this Clinical research associate. Patient also denied any signs/symptoms of depression/anxiety by shaking his head and saying "no". Patient had no stated goals for today.  A- Scheduled medications administered to patient, per MD orders. Support and encouragement provided.  Routine safety checks conducted every 15 minutes.  Patient informed to notify staff with problems or concerns.  R- No adverse drug reactions noted. Patient contracts for safety at this time. Patient compliant with medications and treatment plan. Patient receptive, calm, and cooperative. Patient interacts well with others on the unit.  Patient remains safe at this time.   Problem: Education: Goal: Ability to state activities that reduce stress will improve Outcome: Progressing   Problem: Coping: Goal: Ability to identify and develop effective coping behavior will improve Outcome: Progressing   Problem: Self-Concept: Goal: Ability to identify factors that promote anxiety will improve Outcome: Progressing Goal: Level of anxiety will decrease Outcome: Progressing Goal: Ability to modify response to factors that promote anxiety will improve Outcome: Progressing   Problem: Education: Goal: Knowledge of General Education information will improve Description: Including pain rating scale, medication(s)/side effects and non-pharmacologic comfort measures Outcome: Progressing   Problem: Health Behavior/Discharge Planning: Goal: Ability to manage health-related needs will improve Outcome: Progressing   Problem: Clinical Measurements: Goal: Ability to maintain clinical measurements  within normal limits will improve Outcome: Progressing Goal: Will remain free from infection Outcome: Progressing Goal: Diagnostic test results will improve Outcome: Progressing Goal: Respiratory complications will improve Outcome: Progressing Goal: Cardiovascular complication will be avoided Outcome: Progressing   Problem: Activity: Goal: Risk for activity intolerance will decrease Outcome: Progressing   Problem: Nutrition: Goal: Adequate nutrition will be maintained Outcome: Progressing   Problem: Coping: Goal: Level of anxiety will decrease Outcome: Progressing   Problem: Elimination: Goal: Will not experience complications related to bowel motility Outcome: Progressing Goal: Will not experience complications related to urinary retention Outcome: Progressing   Problem: Pain Managment: Goal: General experience of comfort will improve Outcome: Progressing   Problem: Safety: Goal: Ability to remain free from injury will improve Outcome: Progressing   Problem: Skin Integrity: Goal: Risk for impaired skin integrity will decrease Outcome: Progressing   Problem: Activity: Goal: Interest or engagement in leisure activities will improve Outcome: Progressing   Problem: Coping: Goal: Coping ability will improve Outcome: Progressing   Problem: Education: Goal: Will be free of psychotic symptoms Outcome: Progressing

## 2022-10-25 NOTE — Progress Notes (Signed)
Patient came up to nurses station saying "new place", looking at this Clinical research associate. This Clinical research associate asked patient if he is wandering when he'll go to the new place (facility) that he had the interview with. Patient shook his head "yes". This Clinical research associate stated that when the social workers come back tomorrow, we can get an update. Patient smiled at this writer and walked off, back to his room.

## 2022-10-25 NOTE — Group Note (Signed)
LCSW Group Therapy Note  Group Date: 10/25/2022 Start Time: 1300 End Time: 1400   Type of Therapy and Topic:  Group Therapy: Using "I" Statements  Participation Level:  Active  Description of Group:  Patients were asked to provide details of some interpersonal conflicts they have experienced. Patients were then educated about "I" statements, communication which focuses on feelings or views of the speaker rather than what the other person is doing. T group members were asked to reflect on past conflicts and to provide specific examples for utilizing "I" statements.  Therapeutic Goals:  Patients will verbalize understanding of ineffective communication and effective communication. Patients will be able to empathize with whom they are having conflict. Patients will practice effective communication in the form of "I" statements.    Summary of Patient Progress:  Patients provided with worksheet to assist them in writing a letter of appreciation in observance of thanksgivings day. Patient's spending time amongst each other watching the Thanksgivings day football games in day room.   Therapeutic Modalities:   Cognitive Behavioral Therapy Solution-Focused Therapy    Adam Bischoff W Jahziah Simonin, LCSW 10/25/2022  1:41 PM    

## 2022-10-26 NOTE — Progress Notes (Signed)
This Clinical research associate moved patient's 1500 scheduled medication to 1700, being that he didn't take his morning medication until 1141.

## 2022-10-26 NOTE — BHH Counselor (Signed)
CSW attempted to reach out to [place] at [facility] in [place].  CSW was unable to speak with [facility] or leave a voicemail. CSW to follow up.  CSW called and spoke with pts mother.  CSW updated that this writer is to reach out to [place] [facility] to discuss possible placement and gain more information.  CSW updated that she has reached out but office was closed.  Mother reports that she is in agreement with CSW reaching out and pursuing possible placement with this facility.  CSW followed up with mother speaking to Valerie Roys at the group home. Mother reports that she has reached out but has not been able to speak with Tammy, however, has left a voicemail to call back.  CSW declined to provide Ms. Butch Penny contact due being unsure if Ms Annice Pih wanted that number to be shared.  Mother called this CSW back to again to reiterate that she has called and left another voicemail.  Mother expressed during the call that she would ike to be a part of the weekly meeting for patient.  Mother also expressed that she would like for the patient to transition to a respite home before transitioning to any placement, group home or ALF.  [name], MSW, LCSW 10/26/2022 3:39 PM

## 2022-10-26 NOTE — Progress Notes (Signed)
Patient interacting well with Peers and Staff visible in [place] this shift. Scheduled medications administered per Provider order. Support and encouragement provided. Routine safety checks conducted every 15 minutes. Patient notified to inform staff with problems or concerns.  No adverse drug reactions noted. Patient contracts for safety at this time. Will continue to monitor.

## 2022-10-26 NOTE — Progress Notes (Signed)
   10/26/22 2300  Psych Admission Type (Psych Patients Only)  Admission Status Involuntary  Psychosocial Assessment  Patient Complaints None  Eye Contact Fair  Facial Expression Fixed smile;Animated  Affect Appropriate to circumstance  Speech Aphasic  Interaction Assertive;Demanding  Motor Activity Shuffling;Slow  Appearance/Hygiene Disheveled  Behavior Characteristics Cooperative;Appropriate to situation  Mood Preoccupied  Aggressive Behavior  Effect No apparent injury  Thought Process  Coherency Circumstantial  Content Preoccupation;Paranoia  Delusions None reported or observed  Perception WDL  Hallucination None reported or observed  Judgment WDL  Confusion None  Danger to Self  Current suicidal ideation? Denies (Denies)  Agreement Not to Harm Self Yes  Description of Agreement verbal  Danger to Others  Danger to Others None reported or observed  Danger to Others Abnormal  Harmful Behavior to others No threats or harm toward other people  Destructive Behavior No threats or harm toward property   Patient is compliant with treatment denies SI/HI/A/VH interacting well with Peers and Staff. Q 15 minutes safety checks ongoing without self harm gestures.

## 2022-10-26 NOTE — Progress Notes (Signed)
Patient went back to sleep, after breakfast. This writer will administer scheduled medication once he wakes up. NP was notified during progression rounds.

## 2022-10-26 NOTE — Group Note (Signed)
[facility] LCSW Group Therapy Note   Group Date: 10/26/2022 Start Time: 1300 End Time: 1400  Type of Therapy and Topic:  Group Therapy:  Feelings around Relapse and Recovery  Participation Level:  Minimal    Description of Group:    Patients in this group will discuss emotions they experience before and after a relapse. They will process how experiencing these feelings, or avoidance of experiencing them, relates to having a relapse. Facilitator will guide patients to explore emotions they have related to recovery. Patients will be encouraged to process which emotions are more powerful. They will be guided to discuss the emotional reaction significant others in their lives may have to patients' relapse or recovery. Patients will be assisted in exploring ways to respond to the emotions of others without this contributing to a relapse.  Therapeutic Goals: Patient will identify two or more emotions that lead to relapse for them:  Patient will identify two emotions that result when they relapse:  Patient will identify two emotions related to recovery:  Patient will demonstrate ability to communicate their needs through discussion and/or role plays.   Summary of Patient Progress: Patient was present for the majority of the group process. He contributed the word "whirlwind" to the discussion.    Therapeutic Modalities:   Cognitive Behavioral Therapy Solution-Focused Therapy Assertiveness Training Relapse Prevention Therapy   Glenis Smoker, LCSW

## 2022-10-26 NOTE — Progress Notes (Signed)
Pharmacy - Clozapine     This patient's order has been reviewed for prescribing contraindications.   Clozapine REMS enrollment Verified: yes REMS number: [id number] Enrollment date: 01/04/2022 Current Outpatient Monitoring: every week  Dose Adjustments This Admission: Clozapine 300mg  qhs since 03/19/2022  Date ANC  7/26  2500 8/02   2200 8/21   2500 9/19   2451 10/3  3100 10/17 3800 11/01 3300 11/09 2700 11/17 3100 11/24 3100  Plan: Lab submitted to clozapine REMS Continue ANC labs every week while inpatient   12/24, PharmD Clinical Pharmacist 10/26/2022 11:06 AM

## 2022-10-26 NOTE — Plan of Care (Signed)
D- Patient alert and oriented. Patient presented in a preoccupied, but pleasant mood on assessment stating that he slept ok last night and had no complaints to voice to this Clinical research associate. Patient wanted to make sure that his cell phone, that was approved by leadership for him to have, was on the charger. 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 Patient denied SI, HI, AVH, and pain at this time. Patient also denied any signs/symptoms of depression and anxiety by shaking his head and saying "no". Patient had no stated goals for today.  A- Scheduled medications administered to patient, per MD orders. Support and encouragement provided.  Routine safety checks conducted every 15 minutes.  Patient informed to notify staff with problems or concerns.  R- No adverse drug reactions noted. Patient contracts for safety at this time. Patient compliant with medications and treatment plan. Patient receptive, calm, and cooperative. Patient interacts well with others on the unit. Patient remains safe at this time.  Problem: Education: Goal: Ability to state activities that reduce stress will improve Outcome: Progressing   Problem: Coping: Goal: Ability to identify and develop effective coping behavior will improve Outcome: Progressing   Problem: Self-Concept: Goal: Ability to identify factors that promote anxiety will improve Outcome: Progressing Goal: Level of anxiety will decrease Outcome: Progressing Goal: Ability to modify response to factors that promote anxiety will improve Outcome: Progressing   Problem: Education: Goal: Knowledge of General Education information will improve Description: Including pain rating scale, medication(s)/side effects and non-pharmacologic comfort measures Outcome: Progressing   Problem: Health Behavior/Discharge Planning: Goal: Ability to manage health-related needs will  improve Outcome: Progressing   Problem: Clinical Measurements: Goal: Ability to maintain clinical measurements within normal limits will improve Outcome: Progressing Goal: Will remain free from infection Outcome: Progressing Goal: Diagnostic test results will improve Outcome: Progressing Goal: Respiratory complications will improve Outcome: Progressing Goal: Cardiovascular complication will be avoided Outcome: Progressing   Problem: Activity: Goal: Risk for activity intolerance will decrease Outcome: Progressing   Problem: Nutrition: Goal: Adequate nutrition will be maintained Outcome: Progressing   Problem: Coping: Goal: Level of anxiety will decrease Outcome: Progressing   Problem: Elimination: Goal: Will not experience complications related to bowel motility Outcome: Progressing Goal: Will not experience complications related to urinary retention Outcome: Progressing   Problem: Pain Managment: Goal: General experience of comfort will improve Outcome: Progressing   Problem: Safety: Goal: Ability to remain free from injury will improve Outcome: Progressing   Problem: Skin Integrity: Goal: Risk for impaired skin integrity will decrease Outcome: Progressing   Problem: Activity: Goal: Interest or engagement in leisure activities will improve Outcome: Progressing   Problem: Coping: Goal: Coping ability will improve Outcome: Progressing   Problem: Education: Goal: Will be free of psychotic symptoms Outcome: Progressing

## 2022-10-26 NOTE — Progress Notes (Signed)
[facility] MD Progress Note  10/26/2022 11:44 AM Adam [name]  MRN:  [id number]  Subjective: Patient was seen on rounds today. Appeared irritable and withdrawn and was not wanting to engage in multiple attempts to conversation. Replied "okay" to questions regarding sleeping and appetite. Denies depression and/or anxiety. Denies side effects to medication. Denies homicidal ideation, suicidal ideation, or auditory and/or visual hallucinations. Awaiting placement.  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  History of major stroke causing dense aphasia  Past Medical History:  Past Medical History:  Diagnosis Date   Myocardial infarction (HCC)    Schizophrenia (HCC)    Stroke (HCC)     Past Surgical History:  Procedure Laterality Date   NO PAST SURGERIES     Family History:  Family History  Problem Relation Age of Onset   Hypertension Mother    Family Psychiatric  History: See previous Social History:  Social History   Substance and Sexual Activity  Alcohol Use Not Currently     Social History   Substance and Sexual Activity  Drug Use Not Currently    Social History   Socioeconomic History   Marital status: Single    Spouse name: Not on file   Number of children: Not on file   Years of education: Not on file   Highest education level: Not on file  Occupational History   Not on file  Tobacco Use    Smoking status: Never    Passive exposure: Never   Smokeless tobacco: Never  Vaping Use   Vaping Use: Unknown  Substance and Sexual Activity   Alcohol use: Not Currently   Drug use: Not Currently   Sexual activity: Not Currently  Other Topics Concern   Not on file  Social History Narrative   Not on file   Social Determinants of Health   Financial Resource Strain: Not on file  Food Insecurity: Not on file  Transportation Needs: Not on file  Physical Activity: Not on file  Stress: Not on file  Social Connections: Not on file   Additional Social History:  Specify valuables returned:  (none)                      Sleep: Fair  Appetite:  Fair  Current Medications: Current Facility-Administered Medications  Medication Dose Route Frequency Provider Last Rate Last Admin   acetaminophen (TYLENOL) tablet 650 mg  650 mg Oral Q6H PRN [name], [name], MD   650 mg at 10/14/22 2328   alum & mag hydroxide-simeth (MAALOX/MYLANTA) 200-200-20 MG/5ML suspension 30 mL  30 mL Oral Q4H PRN [name], [name], MD   30 mL at 04/26/22 5830   aspirin EC tablet 81 mg  81 mg Oral Daily [name], [name], MD   81 mg at 10/26/22 1141   atorvastatin (LIPITOR) tablet 20 mg  20 mg Oral Daily [name], [name], MD   20 mg at 10/26/22 1141   atropine 1 % ophthalmic solution 1 drop  1 drop Sublingual QID [name], MD   1 drop at 10/26/22 1141   benztropine (COGENTIN) tablet 0.5 mg  0.5 mg Oral BID [name], [name], MD   0.5 mg at 10/26/22 1141   clonazePAM (KLONOPIN) tablet 1 mg  1 mg Oral TID PRN He, [name], MD   1 mg at 10/14/22 1105   cloZAPine (CLOZARIL) tablet 300 mg  300 mg Oral QHS [name], [name], MD   300 mg at 10/25/22 2108   diphenhydrAMINE (BENADRYL) capsule 50 mg  50 mg Oral Q6H PRN [name], [name], MD   50 mg at 09/30/22 2119   Or   diphenhydrAMINE (BENADRYL) injection 50 mg  50 mg Intramuscular Q6H PRN [name], [name], MD       gabapentin (NEURONTIN) capsule 300 mg  300 mg Oral TID [name],  [name], MD   300 mg at 10/26/22 1141   haloperidol (HALDOL) tablet 2 mg  2 mg Oral TID [name], [name], MD   2 mg at 10/26/22 1141   haloperidol (HALDOL) tablet 5 mg  5 mg Oral Q6H PRN [name], [name], MD   5 mg at 09/17/22 1532   Or   haloperidol lactate (HALDOL) injection 5 mg  5 mg Intramuscular Q6H PRN [name], [name], MD       lisinopril (ZESTRIL) tablet 5 mg  5 mg Oral Daily [name], [name], MD   5 mg at 10/26/22 1141   magnesium hydroxide (MILK OF MAGNESIA) suspension 30 mL  30 mL Oral Daily PRN [name], [name], MD   30 mL at 09/15/22 1047   metoprolol succinate (TOPROL-XL) 24 hr tablet 25 mg  25 mg Oral Daily [name], [name], MD   25 mg at 10/26/22 1141   ondansetron (ZOFRAN) tablet 4 mg  4 mg Oral Q8H PRN Jearld Lesch, NP   4 mg at 09/06/22 2320   paliperidone (INVEGA SUSTENNA) injection 156 mg  156 mg Intramuscular Q28 days [name], [name], MD   156 mg at 10/23/22 1715   paliperidone (INVEGA) 24 hr tablet 6 mg  6 mg Oral QHS [name], [name], MD   6 mg at 10/25/22 2108   senna-docusate (Senokot-S) tablet 2 tablet  2 tablet Oral QHS PRN Sarina Ill, DO   2 tablet at 09/15/22 2110   temazepam (RESTORIL) capsule 15 mg  15 mg Oral QHS [name], [name], MD   15 mg at 10/25/22 2109   ziprasidone (GEODON) injection 20 mg  20 mg Intramuscular Q12H PRN [name], [name], MD   20 mg at 07/08/22 1453    Lab Results:  Results for orders placed or performed during the hospital encounter of 03/19/22 (from the past 48 hour(s))  CBC with Differential/Platelet     Status: Abnormal   Collection Time: 10/25/22  9:22 AM  Result Value Ref Range   WBC 4.8 4.0 - 10.5 K/uL   RBC 4.03 (L) 4.22 - 5.81 MIL/uL   Hemoglobin 12.7 (L) 13.0 - 17.0 g/dL   HCT 40.9 (L) 81.1 - 91.4 %   MCV 89.6 80.0 - 100.0 fL   MCH 31.5 26.0 - 34.0 pg   MCHC 35.2 30.0 - 36.0 g/dL   RDW 78.2 95.6 - 21.3 %   Platelets 170 150 - 400 K/uL   nRBC 0.0 0.0 - 0.2 %   Neutrophils Relative % 65 %   Neutro Abs 3.1 1.7 - 7.7  K/uL   Lymphocytes Relative 23 %  Lymphs Abs 1.1 0.7 - 4.0 K/uL   Monocytes Relative 9 %   Monocytes Absolute 0.5 0.1 - 1.0 K/uL   Eosinophils Relative 1 %   Eosinophils Absolute 0.0 0.0 - 0.5 K/uL   Basophils Relative 2 %   Basophils Absolute 0.1 0.0 - 0.1 K/uL   Immature Granulocytes 0 %   Abs Immature Granulocytes 0.01 0.00 - 0.07 K/uL    Comment: Performed at [facility], [address] [place], [place] [address]      Blood Alcohol level:  Lab Results  Component Value Date   [facility] <10 07/20/2021    Metabolic Disorder Labs: Lab Results  Component Value Date   HGBA1C 5.5 04/10/2022   MPG 111.15 04/10/2022   No results found for: "PROLACTIN" Lab Results  Component Value Date   CHOL 167 11/20/2021   TRIG 107 11/20/2021   HDL 26 (L) 11/20/2021   CHOLHDL 6.4 11/20/2021   VLDL 21 11/20/2021   LDLCALC 120 (H) 11/20/2021    Physical Findings: AIMS: Facial and Oral Movements Muscles of Facial Expression: None, normal Lips and Perioral Area: None, normal Jaw: None, normal Tongue: None, normal,Extremity Movements Upper (arms, wrists, hands, fingers): None, normal Lower (legs, knees, ankles, toes): None, normal, Trunk Movements Neck, shoulders, hips: None, normal, Overall Severity Severity of abnormal movements (highest score from questions above): None, normal Incapacitation due to abnormal movements: None, normal Patient's awareness of abnormal movements (rate only patient's report): No Awareness, Dental Status Current problems with teeth and/or dentures?: No Does patient usually wear dentures?: No  CIWA:    COWS:     Musculoskeletal: Strength & Muscle Tone: within normal limits Gait & Station: normal Patient leans: N/A  Psychiatric Specialty Exam: Physical Exam Vitals and nursing note reviewed.  Constitutional:      Appearance: Normal appearance.  HENT:     Head: Normocephalic and atraumatic.     Nose: Nose normal.  Pulmonary:     Effort:  Pulmonary effort is normal.  Musculoskeletal:        General: Normal range of motion.     Cervical back: Normal range of motion.  Neurological:     General: No focal deficit present.     Mental Status: He is alert and oriented to person, place, and time.  Psychiatric:        Attention and Perception: He is inattentive.        Mood and Affect: Affect is blunt.        Speech: Speech normal.        Behavior: Behavior is agitated.        Thought Content: Thought content normal.        Cognition and Memory: Cognition is impaired.        Judgment: Judgment is impulsive.     Review of Systems  Unable to perform ROS: Language  Psychiatric/Behavioral:  The patient is nervous/anxious.   All other systems reviewed and are negative.   Blood pressure 108/66, pulse 87, temperature 98.2 F (36.8 C), temperature source Oral, resp. rate 18, height 5\' 8"  (1.727 m), weight 85.8 kg, SpO2 100 %.Body mass index is 28.76 kg/m.  General Appearance: Casual  Eye Contact:  Good  Speech:  Garbled  Volume:  Normal  Mood:  Euthymic  Affect:  Congruent  Thought Process:  seems coherent  Orientation:  Full (Time, Place, and Person)  Thought Content:  UTA, difficult to understand  Suicidal Thoughts:  No  Homicidal Thoughts:  No  Memory:  Immediate;   Fair Recent;   Fair Remote;   Fair  Judgement:  Fair  Insight:  UTA  Psychomotor Activity:  Normal  Concentration:  Concentration: Fair and Attention Span: Fair  Recall:  URA  Fund of Knowledge:  UTA  Language:  Poor  Akathisia:  No  Handed:  Right  AIMS (if indicated):     Assets:  Leisure Time Physical Health Resilience Social Support  ADL's:  Intact  Cognition:  Impaired,  Mild  Sleep:  Number of Hours: 5.5      Physical Exam: Physical Exam Vitals and nursing note reviewed.  Constitutional:      Appearance: Normal appearance.  HENT:     Head: Normocephalic and atraumatic.     Nose: Nose normal.  Pulmonary:     Effort: Pulmonary  effort is normal.  Musculoskeletal:        General: Normal range of motion.     Cervical back: Normal range of motion.  Neurological:     General: No focal deficit present.     Mental Status: He is alert and oriented to person, place, and time.  Psychiatric:        Attention and Perception: He is inattentive.        Mood and Affect: Affect is blunt.        Speech: Speech normal.        Behavior: Behavior is agitated.        Thought Content: Thought content normal.        Cognition and Memory: Cognition is impaired.        Judgment: Judgment is impulsive.    Review of Systems  Unable to perform ROS: Language  Psychiatric/Behavioral:  The patient is nervous/anxious.   All other systems reviewed and are negative.  Blood pressure 108/66, pulse 87, temperature 98.2 F (36.8 C), temperature source Oral, resp. rate 18, height 5\' 8"  (1.727 m), weight 85.8 kg, SpO2 100 %. Body mass index is 28.76 kg/m.   Treatment Plan Summary: Medication management and Plan no change to medication.  Offered support for the patient and encouraged him to let know if there is something specific we can do to help.  Potential placement found for patient - performed TB skin test waiting for results and COVID testing was negative. Schizophrenia: Haldol 2 mg TID Clozaril 300 mg at bedtime Invega 156 mg monthly plus Invega 6 mg daily  Anxiety Gabapentin 300 mg TID Klonopin 1 mg TID PRN  Insomnia Restoril 15 mg daily at bedtime  EPS: Cogentin 0.5 mg BID  [place], NP 10/26/2022, 11:44 AM

## 2022-10-27 NOTE — Plan of Care (Addendum)
  Problem: Self-Concept: Goal: Level of anxiety will decrease Outcome: Progressing   Problem: Education: Goal: Knowledge of General Education information will improve Description: Including pain rating scale, medication(s)/side effects and non-pharmacologic comfort measures Outcome: Progressing   Problem: Activity: Goal: Risk for activity intolerance will decrease Outcome: Progressing   Problem: Coping: Goal: Level of anxiety will decrease Outcome: Progressing   Problem: Coping: Goal: Coping ability will improve Outcome: Progressing   Problem: Education: Goal: Will be free of psychotic symptoms Outcome: Progressing     Patient alert but uncooperative at times.  Patient is medication compliant with much encouragement.  Patient slammed the door on the doctor this morning during rounds but was easily redirected.  Patient has been out of room walking in hallway and playing with his phone.  Patient received one arm band this am and by 3:30 had taken it off and requesting another.  No s/s of anxiety, depression and pain noted.  Patient denies SI and HI.  Patient does not appear to be responding to stimuli.  Q 15 minute rounds in progress, will continue to monitor.

## 2022-10-27 NOTE — Progress Notes (Signed)
[facility] [place] MD Progress Note  10/27/2022 2:06 PM Adam [name]  MRN:  [id number]  Subjective:  Go away" Per H & p note-  61 year old man who originally came to the emergency room in August 2022 after becoming aggressive at his group home.  He remained in the emergency room until December because we could not find any placement for him.  In December he developed cardiac symptoms and was diagnosed with a myocardial infarct and admitted to the medical service.  Since then he has been on the medical wards.  Patient himself is unable to give any history.  Although he is somewhat verbal he is not able to really communicate with words as he will just focus on 1 or 2 words at a time and repeat them over and over.  Impossible to tell if he is psychotic.  Patient is emotionally labile and unpredictable and intermittently agitated constantly.  Despite multiple medications including now being on clozapine he remains difficult to manage.  Report from his mother is that the patient had a traumatic brain injury which is supported by findings on CT and MRI.  Also reportedly a history of schizophrenia that predates that.   Per nursing- This writer moved patient's 1500 scheduled medication to 1700, being that he didn't take his morning medication until 1141. 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 Patient denied SI, HI, AVH, and pain at this time. Patient also denied any signs/symptoms of depression and anxiety by shaking his head and saying "no"  On assessment today-patient initially refused to talk to this provider,would say just a "no , go away."  Patient is not anxious and guarded appears suspicious, would like to stay alone in his room.   Per SW-. Awaiting placement.  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  History of major stroke causing dense aphasia  Past Medical History:  Past Medical History:  Diagnosis Date   Myocardial infarction (HCC)    Schizophrenia (HCC)    Stroke (HCC)     Past Surgical History:  Procedure Laterality Date   NO PAST SURGERIES     Family History:  Family History  Problem Relation Age of Onset   Hypertension Mother    Family Psychiatric  History: See previous Social History:  Social History   Substance and Sexual Activity  Alcohol Use Not Currently     Social History   Substance and Sexual Activity  Drug Use Not Currently    Social History   Socioeconomic History   Marital status: Single    Spouse name: Not on file   Number of children: Not on file   Years of education: Not on file   Highest education level: Not on file  Occupational History   Not on file  Tobacco Use   Smoking status: Never    Passive exposure: Never   Smokeless tobacco: Never  Vaping Use  Vaping Use: Unknown  Substance and Sexual Activity   Alcohol use: Not Currently   Drug use: Not Currently   Sexual activity: Not Currently  Other Topics Concern   Not on file  Social History Narrative   Not on file   Social Determinants of Health   Financial Resource Strain: Not on file  Food Insecurity: Not on file  Transportation Needs: Not on file  Physical Activity: Not on file  Stress: Not on file  Social Connections: Not on file   Additional Social History:  Specify valuables returned:  (none)                       Sleep: Fair  Appetite:  Fair  Current Medications: Current Facility-Administered Medications  Medication Dose Route Frequency Provider Last Rate Last Admin   acetaminophen (TYLENOL) tablet 650 mg  650 mg Oral Q6H PRN [name], [name], MD   650 mg at 10/14/22 2328   alum & mag hydroxide-simeth (MAALOX/MYLANTA) 200-200-20 MG/5ML suspension 30 mL  30 mL Oral Q4H PRN [name], [name], MD   30 mL at 04/26/22 1610   aspirin EC tablet 81 mg  81 mg Oral Daily [name], [name], MD   81 mg at 10/27/22 0830   atorvastatin (LIPITOR) tablet 20 mg  20 mg Oral Daily [name], [name], MD   20 mg at 10/27/22 0830   atropine 1 % ophthalmic solution 1 drop  1 drop Sublingual QID [name], MD   1 drop at 10/27/22 1226   benztropine (COGENTIN) tablet 0.5 mg  0.5 mg Oral BID [name], [name], MD   0.5 mg at 10/27/22 0830   clonazePAM (KLONOPIN) tablet 1 mg  1 mg Oral TID PRN He, [name], MD   1 mg at 10/14/22 1105   cloZAPine (CLOZARIL) tablet 300 mg  300 mg Oral QHS [name], [name], MD   300 mg at 10/26/22 2114   diphenhydrAMINE (BENADRYL) capsule 50 mg  50 mg Oral Q6H PRN [name], [name], MD   50 mg at 09/30/22 2119   Or   diphenhydrAMINE (BENADRYL) injection 50 mg  50 mg Intramuscular Q6H PRN [name], [name], MD       gabapentin (NEURONTIN) capsule 300 mg  300 mg Oral TID [name], [name], MD   300 mg at 10/27/22 0936   haloperidol (HALDOL) tablet 2 mg  2 mg Oral TID [name], [name], MD   2 mg at 10/27/22 0940   haloperidol (HALDOL) tablet 5 mg  5 mg Oral Q6H PRN [name], [name], MD   5 mg at 09/17/22 1532   Or   haloperidol lactate (HALDOL) injection 5 mg  5 mg Intramuscular Q6H PRN [name], [name], MD       lisinopril (ZESTRIL) tablet 5 mg  5 mg Oral Daily [name], [name], MD   5 mg at 10/27/22 0830   magnesium hydroxide (MILK OF MAGNESIA) suspension 30 mL  30 mL Oral Daily PRN [name], [name], MD   30 mL at 09/15/22 1047   metoprolol succinate (TOPROL-XL) 24 hr tablet 25 mg  25 mg Oral  Daily [name], [name], MD   25 mg at 10/27/22 0830   ondansetron (ZOFRAN) tablet 4 mg  4 mg Oral Q8H PRN Jearld Lesch, NP   4 mg at 09/06/22 2320   paliperidone (INVEGA SUSTENNA) injection 156 mg  156 mg Intramuscular Q28 days [name], [name], MD   156 mg  at 10/23/22 1715   paliperidone (INVEGA) 24 hr tablet 6 mg  6 mg Oral QHS [name], [name], MD   6 mg at 10/26/22 2114   senna-docusate (Senokot-S) tablet 2 tablet  2 tablet Oral QHS PRN Sarina Ill, DO   2 tablet at 09/15/22 2110   temazepam (RESTORIL) capsule 15 mg  15 mg Oral QHS [name], [name], MD   15 mg at 10/26/22 2114   ziprasidone (GEODON) injection 20 mg  20 mg Intramuscular Q12H PRN [name], [name], MD   20 mg at 07/08/22 1453    Lab Results: No results found for this or any previous visit (from the past 48 hour(s)).     Blood Alcohol level:  Lab Results  Component Value Date   ETH <10 07/20/2021    Metabolic Disorder Labs: Lab Results  Component Value Date   HGBA1C 5.5 04/10/2022   MPG 111.15 04/10/2022   No results found for: "PROLACTIN" Lab Results  Component Value Date   CHOL 167 11/20/2021   TRIG 107 11/20/2021   HDL 26 (L) 11/20/2021   CHOLHDL 6.4 11/20/2021   VLDL 21 11/20/2021   LDLCALC 120 (H) 11/20/2021    Physical Findings: AIMS: Facial and Oral Movements Muscles of Facial Expression: None, normal Lips and Perioral Area: None, normal Jaw: None, normal Tongue: None, normal,Extremity Movements Upper (arms, wrists, hands, fingers): None, normal Lower (legs, knees, ankles, toes): None, normal, Trunk Movements Neck, shoulders, hips: None, normal, Overall Severity Severity of abnormal movements (highest score from questions above): None, normal Incapacitation due to abnormal movements: None, normal Patient's awareness of abnormal movements (rate only patient's report): No Awareness, Dental Status Current problems with teeth and/or dentures?: No Does patient usually wear dentures?: No   CIWA:    COWS:     Musculoskeletal: Strength & Muscle Tone: within normal limits Gait & Station: normal Patient leans: N/A  Psychiatric Specialty Exam: Physical Exam Vitals and nursing note reviewed.  Constitutional:      Appearance: Normal appearance.  HENT:     Head: Normocephalic and atraumatic.     Nose: Nose normal.  Pulmonary:     Effort: Pulmonary effort is normal.  Musculoskeletal:        General: Normal range of motion.     Cervical back: Normal range of motion.  Neurological:     General: No focal deficit present.     Mental Status: He is alert and oriented to person, place, and time.  Psychiatric:        Attention and Perception: He is inattentive.        Mood and Affect: Affect is blunt.        Speech: Speech normal.        Behavior: Behavior is agitated.        Thought Content: Thought content normal.        Cognition and Memory: Cognition is impaired.        Judgment: Judgment is impulsive.     Review of Systems  Unable to perform ROS: Language  Psychiatric/Behavioral:  The patient is nervous/anxious.   All other systems reviewed and are negative.   Blood pressure 108/66, pulse 87, temperature 98.2 F (36.8 C), temperature source Oral, resp. rate 18, height 5\' 8"  (1.727 m), weight 85.8 kg, SpO2 100 %.Body mass index is 28.76 kg/m.  General Appearance: Casual, uncooperative  Eye Contact:  poor  Speech:  Garbled  Volume:  Normal  Mood:  anxious, suspicious  Affect:  Congruent, labile  Thought Process:  concrete  Orientation:  Full (Time, Place, and Person)  Thought Content:  UTA, difficult to understand  Suicidal Thoughts:  No  Homicidal Thoughts:  No  Memory:  Immediate;   Fair Recent;   Fair Remote;   Fair  Judgement:  Fair  Insight:  UTA  Psychomotor Activity:  Normal  Concentration:  Concentration: Fair and Attention Span: Fair  Recall:  URA  Fund of Knowledge:  UTA  Language:  Poor  Akathisia:  No  Handed:  Right  AIMS (if  indicated):     Assets:  Leisure Time Physical Health Resilience Social Support  ADL's:  Intact  Cognition:  Impaired,  Mild  Sleep:  Number of Hours: 5.5      Physical Exam: Physical Exam Vitals and nursing note reviewed.  Constitutional:      Appearance: Normal appearance.  HENT:     Head: Normocephalic and atraumatic.     Nose: Nose normal.  Pulmonary:     Effort: Pulmonary effort is normal.  Musculoskeletal:        General: Normal range of motion.     Cervical back: Normal range of motion.  Neurological:     General: No focal deficit present.     Mental Status: He is alert and oriented to person, place, and time.  Psychiatric:        Attention and Perception: He is inattentive.        Mood and Affect: Affect is blunt.        Speech: Speech normal.        Behavior: Behavior is agitated.        Thought Content: Thought content normal.        Cognition and Memory: Cognition is impaired.        Judgment: Judgment is impulsive.    Review of Systems  Unable to perform ROS: Language  Psychiatric/Behavioral:  The patient is nervous/anxious.   All other systems reviewed and are negative.  Blood pressure 108/66, pulse 87, temperature 98.2 F (36.8 C), temperature source Oral, resp. rate 18, height 5\' 8"  (1.727 m), weight 85.8 kg, SpO2 100 %. Body mass index is 28.76 kg/m.   Treatment Plan Summary: Medication management and Plan no change to medication.  Offered support for the patient and encouraged him to let know if there is something specific we can do to help.  Potential placement found for patient - performed TB skin test waiting for results and COVID testing was negative.  Pt disorganized and psychotic, obtain EKG for QTc if he cooperates.  Schizophrenia: Haldol 2 mg TID Clozaril 300 mg at bedtime Invega 156 mg monthly plus Invega 6 mg daily  Anxiety Gabapentin 300 mg TID Klonopin 1 mg TID PRN  Insomnia Restoril 15 mg daily at bedtime  EPS: Cogentin  0.5 mg BID  [place], MD 10/27/2022, 2:06 PMPatient ID: [id number]/29/2022, male   DOB: 1961/10/09, 61 y.o.   MRN: [id number]

## 2022-10-28 NOTE — Group Note (Signed)
[facility] LCSW Group Therapy Note   Group Date: 10/28/2022 Start Time: 1300 End Time: 1400   Type of Therapy and Topic: Group Therapy: Avoiding Self-Sabotaging and Enabling Behaviors  Participation Level: Minimal  Mood: Blunted   Description of Group:  In this group, patients will learn how to identify obstacles, self-sabotaging and enabling behaviors, as well as: what are they, why do we do them and what needs these behaviors meet. Discuss unhealthy relationships and how to have positive healthy boundaries with those that sabotage and enable. Explore aspects of self-sabotage and enabling in yourself and how to limit these self-destructive behaviors in everyday life.   Therapeutic Goals: 1. Patient will identify one obstacle that relates to self-sabotage and enabling behaviors 2. Patient will identify one personal self-sabotaging or enabling behavior they did prior to admission 3. Patient will state a plan to change the above identified behavior 4. Patient will demonstrate ability to communicate their needs through discussion and/or role play.    Summary of Patient Progress:   Patient was present for the entirety of group session. Patient participated in opening and closing remarks. However, patient did not contribute at all to the topic of discussion despite encouraged participation.    Therapeutic Modalities:  Cognitive Behavioral Therapy Person-Centered Therapy Motivational Interviewing    Corky Crafts, [place]

## 2022-10-28 NOTE — Progress Notes (Signed)
[facility] MD Progress Note  10/28/2022 3:03 PM Adam [name]  MRN:  [id number]  Subjective:  ok" Per H & p note-  61 year old man who originally came to the emergency room in August 2022 after becoming aggressive at his group home.  He remained in the emergency room until December because we could not find any placement for him.  In December he developed cardiac symptoms and was diagnosed with a myocardial infarct and admitted to the medical service.  Since then he has been on the medical wards.  Patient himself is unable to give any history.  Although he is somewhat verbal he is not able to really communicate with words as he will just focus on 1 or 2 words at a time and repeat them over and over.  Impossible to tell if he is psychotic.  Patient is emotionally labile and unpredictable and intermittently agitated constantly.  Despite multiple medications including now being on clozapine he remains difficult to manage.  Report from his mother is that the patient had a traumatic brain injury which is supported by findings on CT and MRI.  Also reportedly a history of schizophrenia that predates that.   Per nursing- Patient alert with periods of confusion to time and situation, he appears less anxious, not  angry or agitated. Patient was calm this shift, he  denies SI/HI/AVH, was complaint with medication regimen and interacting appropriately with peers and staff. Patient was offered emotional support,15 minutes safety checks maintained, will continue to monitor. .  On assessment today-patient less anxious, calm and brighter this morning, denies any complaints, answers with 1-2 words, pt taking meds, tolerating well,    Per SW-. Awaiting placement.  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  History of major stroke causing dense aphasia  Past Medical History:  Past Medical History:  Diagnosis Date   Myocardial infarction (HCC)    Schizophrenia (HCC)    Stroke (HCC)     Past Surgical History:  Procedure Laterality Date   NO PAST SURGERIES     Family History:  Family History  Problem Relation Age of Onset   Hypertension Mother    Family Psychiatric  History: See previous Social History:  Social History   Substance and Sexual Activity  Alcohol Use Not Currently     Social History   Substance and Sexual Activity  Drug Use Not Currently    Social History   Socioeconomic History   Marital status: Single    Spouse name: Not on file   Number of children: Not on file   Years of education: Not on file   Highest education level: Not on file  Occupational History   Not on file  Tobacco Use   Smoking status: Never    Passive exposure: Never   Smokeless tobacco: Never  Vaping Use   Vaping Use: Unknown  Substance and Sexual Activity   Alcohol use: Not Currently   Drug use: Not Currently   Sexual activity: Not Currently  Other Topics Concern   Not on file  Social History Narrative   Not on file   Social Determinants of Health   Financial Resource Strain: Not on file  Food Insecurity: Not on file  Transportation Needs: Not on file  Physical Activity: Not on file  Stress: Not on file  Social Connections: Not on file   Additional Social History:  Specify valuables returned:  (none)                      Sleep: Fair  Appetite:  Fair  Current Medications: Current  Facility-Administered Medications  Medication Dose Route Frequency Provider Last Rate Last Admin   acetaminophen (TYLENOL) tablet 650 mg  650 mg Oral Q6H PRN [name], [name], MD   650 mg at 10/14/22 2328   alum & mag hydroxide-simeth (MAALOX/MYLANTA) 200-200-20 MG/5ML suspension 30 mL  30 mL Oral Q4H PRN [name], [name], MD   30 mL at 04/26/22 1751   aspirin EC tablet 81 mg  81 mg Oral Daily [name], [name], MD   81 mg at 10/28/22 0845   atorvastatin (LIPITOR) tablet 20 mg  20 mg Oral Daily [name], [name], MD   20 mg at 10/28/22 0845   atropine 1 % ophthalmic solution 1 drop  1 drop Sublingual QID [name], MD   1 drop at 10/28/22 1210   benztropine (COGENTIN) tablet 0.5 mg  0.5 mg Oral BID [name], [name], MD   0.5 mg at 10/28/22 0845   clonazePAM (KLONOPIN) tablet 1 mg  1 mg Oral TID PRN He, [name], MD   1 mg at 10/14/22 1105   cloZAPine (CLOZARIL) tablet 300 mg  300 mg Oral QHS [name], [name], MD   300 mg at 10/27/22 2122   diphenhydrAMINE (BENADRYL) capsule 50 mg  50 mg Oral Q6H PRN [name], [name], MD   50 mg at 09/30/22 2119   Or   diphenhydrAMINE (BENADRYL) injection 50 mg  50 mg Intramuscular Q6H PRN [name], [name], MD       gabapentin (NEURONTIN) capsule 300 mg  300 mg Oral TID [name], [name], MD   300 mg at 10/28/22 0258   haloperidol (HALDOL) tablet 2 mg  2 mg Oral TID [name], [name], MD   2 mg at 10/28/22 0954   haloperidol (HALDOL) tablet 5 mg  5 mg Oral Q6H PRN [name], [name], MD   5 mg at 09/17/22 1532   Or   haloperidol lactate (HALDOL) injection 5 mg  5 mg Intramuscular Q6H PRN [name], [name], MD       lisinopril (ZESTRIL) tablet 5 mg  5 mg Oral Daily [name], [name], MD   5 mg at 10/28/22 0845   magnesium hydroxide (MILK OF MAGNESIA) suspension 30 mL  30 mL Oral Daily PRN [name], [name], MD   30 mL at 09/15/22 1047   metoprolol succinate (TOPROL-XL) 24 hr tablet 25 mg  25 mg Oral Daily [name], [name], MD   25 mg at 10/28/22 0845   ondansetron (ZOFRAN) tablet 4 mg  4  mg Oral Q8H PRN Lerry Liner M, NP   4 mg at 09/06/22 2320   paliperidone (INVEGA SUSTENNA) injection 156 mg  156 mg Intramuscular Q28 days [name], [name], MD   156 mg at 10/23/22 1715   paliperidone (INVEGA) 24 hr tablet 6 mg  6 mg Oral QHS [name], [name], MD   6 mg at 10/27/22  2123   senna-docusate (Senokot-S) tablet 2 tablet  2 tablet Oral QHS PRN Sarina Ill, DO   2 tablet at 09/15/22 2110   temazepam (RESTORIL) capsule 15 mg  15 mg Oral QHS [name], [name], MD   15 mg at 10/27/22 2123   ziprasidone (GEODON) injection 20 mg  20 mg Intramuscular Q12H PRN [name], [name], MD   20 mg at 07/08/22 1453    Lab Results: No results found for this or any previous visit (from the past 48 hour(s)).     Blood Alcohol level:  Lab Results  Component Value Date   ETH <10 07/20/2021    Metabolic Disorder Labs: Lab Results  Component Value Date   HGBA1C 5.5 04/10/2022   MPG 111.15 04/10/2022   No results found for: "PROLACTIN" Lab Results  Component Value Date   CHOL 167 11/20/2021   TRIG 107 11/20/2021   HDL 26 (L) 11/20/2021   CHOLHDL 6.4 11/20/2021   VLDL 21 11/20/2021   LDLCALC 120 (H) 11/20/2021    Physical Findings: AIMS: Facial and Oral Movements Muscles of Facial Expression: None, normal Lips and Perioral Area: None, normal Jaw: None, normal Tongue: None, normal,Extremity Movements Upper (arms, wrists, hands, fingers): None, normal Lower (legs, knees, ankles, toes): None, normal, Trunk Movements Neck, shoulders, hips: None, normal, Overall Severity Severity of abnormal movements (highest score from questions above): None, normal Incapacitation due to abnormal movements: None, normal Patient's awareness of abnormal movements (rate only patient's report): No Awareness, Dental Status Current problems with teeth and/or dentures?: No Does patient usually wear dentures?: No  CIWA:    COWS:     Musculoskeletal: Strength & Muscle Tone: within normal  limits Gait & Station: normal Patient leans: N/A  Psychiatric Specialty Exam: Physical Exam Vitals and nursing note reviewed.  Constitutional:      Appearance: Normal appearance.  HENT:     Head: Normocephalic and atraumatic.     Nose: Nose normal.  Pulmonary:     Effort: Pulmonary effort is normal.  Musculoskeletal:        General: Normal range of motion.     Cervical back: Normal range of motion.  Neurological:     General: No focal deficit present.     Mental Status: He is alert and oriented to person, place, and time.  Psychiatric:        Attention and Perception: He is inattentive.        Mood and Affect: Affect is blunt.        Speech: Speech normal.        Behavior: Behavior is agitated.        Thought Content: Thought content normal.        Cognition and Memory: Cognition is impaired.        Judgment: Judgment is impulsive.     Review of Systems  Unable to perform ROS: Language  Psychiatric/Behavioral:  The patient is nervous/anxious.   All other systems reviewed and are negative.   Blood pressure 108/66, pulse 87, temperature 98.2 F (36.8 C), temperature source Oral, resp. rate 18, height 5\' 8"  (1.727 m), weight 85.8 kg, SpO2 100 %.Body mass index is 28.76 kg/m.  General Appearance: Casual, uncooperative  Eye Contact:  poor  Speech:  minimal, 1-2 words,   Volume:  Normal  Mood:  less anxious,   Affect:  Congruent, less labile  Thought Process:  concrete  Orientation:  Full (Time, Place, and Person)  Thought Content:  UTA, difficult to  understand  Suicidal Thoughts:  No  Homicidal Thoughts:  No  Memory:  Immediate;   Fair Recent;   Fair Remote;   Fair  Judgement:  Fair  Insight:  UTA  Psychomotor Activity:  Normal  Concentration:  Concentration: Fair and Attention Span: Fair  Recall:  URA  Fund of Knowledge:  UTA  Language:  Poor  Akathisia:  No  Handed:  Right  AIMS (if indicated):     Assets:  Leisure Time Physical Health Resilience Social  Support  ADL's:  Intact  Cognition:  Impaired,  Mild  Sleep:  Number of Hours: 5.5      Physical Exam: Physical Exam Vitals and nursing note reviewed.  Constitutional:      Appearance: Normal appearance.  HENT:     Head: Normocephalic and atraumatic.     Nose: Nose normal.  Pulmonary:     Effort: Pulmonary effort is normal.  Musculoskeletal:        General: Normal range of motion.     Cervical back: Normal range of motion.  Neurological:     General: No focal deficit present.     Mental Status: He is alert and oriented to person, place, and time.  Psychiatric:        Attention and Perception: He is inattentive.        Mood and Affect: Affect is blunt.        Speech: Speech normal.        Behavior: Behavior is agitated.        Thought Content: Thought content normal.        Cognition and Memory: Cognition is impaired.        Judgment: Judgment is impulsive.    Review of Systems  Unable to perform ROS: Language  Psychiatric/Behavioral:  The patient is nervous/anxious.   All other systems reviewed and are negative.  Blood pressure 108/66, pulse 87, temperature 98.2 F (36.8 C), temperature source Oral, resp. rate 18, height 5\' 8"  (1.727 m), weight 85.8 kg, SpO2 100 %. Body mass index is 28.76 kg/m.   Treatment Plan Summary: Medication management and Plan no change to medication.  Offered support for the patient and encouraged him to let know if there is something specific we can do to help.  Potential placement found for patient - performed TB skin test waiting for results and COVID testing was negative.  Pt disorganized and psychotic, obtain EKG for QTc if he cooperates.  Schizophrenia: Haldol 2 mg TID Clozaril 300 mg at bedtime, ANC- 3.1 on 11/23 Invega 156 mg monthly plus Invega 6 mg daily  Anxiety Gabapentin 300 mg TID Klonopin 1 mg TID PRN  Insomnia Restoril 15 mg daily at bedtime  EPS: Cogentin 0.5 mg BID  12/23, MD 10/28/2022, 3:03  PMPatient ID: 10/30/2022, male   DOB: 1961/08/29, 61 y.o.   MRN: [id number] Patient ID: Wacey Zieger, male   DOB: [birth year], 61 y.o.   MRN: [id number]

## 2022-10-28 NOTE — Plan of Care (Signed)
  Problem: Self-Concept: Goal: Level of anxiety will decrease Outcome: Progressing   Problem: Education: Goal: Knowledge of General Education information will improve Description: Including pain rating scale, medication(s)/side effects and non-pharmacologic comfort measures Outcome: Progressing   Problem: Activity: Goal: Risk for activity intolerance will decrease Outcome: Progressing   Problem: Nutrition: Goal: Adequate nutrition will be maintained Outcome: Progressing   Problem: Coping: Goal: Level of anxiety will decrease Outcome: Progressing   Problem: Pain Managment: Goal: General experience of comfort will improve Outcome: Progressing  Patient alert and cooperative with assessment this am.  Patient is medication compliant.  Patient out off room to medication room for medications.   No s/s of anxiety or depression noted.  Patient does not appear to be responding.  Patient appetite is good when he eats, patient often sleeps through breakfast.  No s/s of any discomfort noted.  Patient can contract for safety.  Q 15 minute rounds in progress, will continue to monitor.

## 2022-10-28 NOTE — Progress Notes (Signed)
Patient alert with periods of confusion to time and situation, he appears less anxious, not  angry or agitated. Patient was calm this shift, he  denies SI/HI/AVH, was complaint with medication regimen and interacting appropriately with peers and staff. Patient was offered emotional support,15 minutes safety checks maintained, will continue to monitor.

## 2022-10-29 NOTE — [facility] Counselor (Signed)
CSW attempted to reach group home owner, Valerie Roys.  CSW left HIPAA compliant voicemail.  CSW attempted to contact [place] at [facility], however, was unable to speak with him or leave voicemail as the mailbox was full.  CSW to attempt again at later date.   [name], MSW, LCSW 10/29/2022 11:03 AM

## 2022-10-29 NOTE — Progress Notes (Signed)
Recreation Therapy Notes  Date: 10/29/2022  Time: 10:45 am    Location: Craft room   Behavioral response: Appropriate   Intervention Topic:  Time-Management     Discussion/Intervention:  Group content today was focused on time management. The group defined time management and identified healthy ways to manage time. Individuals expressed how much of the 24 hours they use in a day. Patients expressed how much time they use just for themselves personally. The group expressed how they have managed their time in the past. Individuals participated in the intervention "Managing Life" where they had a chance to see how much of the 24 hours they use and where it goes. Clinical Observations/Feedback: Patient came to group and was agitated by peer peer and left group early.   [name] LRT/CTRS         [name] 10/29/2022 12:37 PM

## 2022-10-29 NOTE — [facility] Group Notes (Signed)
[facility] Group Notes:  (Nursing/MHT/Case Management/Adjunct)  Date:  10/29/2022  Time:  8:57 PM  Type of Therapy:   Wrap up  Participation Level:  Did Not Attend  Summary of Progress/Problems:  Adam [name] 10/29/2022, 8:57 PM

## 2022-10-29 NOTE — Progress Notes (Signed)
Patient is alert and oriented x 2 with confusion to  time and situation, he appears much calm not  agitated. Patient denies SI/HI/AVH, he was offered emotional support. 15 minutes safety checks maintained, will continue to monitor.

## 2022-10-29 NOTE — Plan of Care (Signed)
  Problem: Education: Goal: Ability to state activities that reduce stress will improve Outcome: Progressing   Problem: Coping: Goal: Ability to identify and develop effective coping behavior will improve Outcome: Progressing   Problem: Self-Concept: Goal: Ability to identify factors that promote anxiety will improve Outcome: Progressing Goal: Level of anxiety will decrease Outcome: Progressing   Problem: Activity: Goal: Risk for activity intolerance will decrease Outcome: Progressing   Problem: Nutrition: Goal: Adequate nutrition will be maintained Outcome: Progressing   Problem: Elimination: Goal: Will not experience complications related to bowel motility Outcome: Progressing   Problem: Safety: Goal: Ability to remain free from injury will improve Outcome: Progressing   Problem: Skin Integrity: Goal: Risk for impaired skin integrity will decrease Outcome: Progressing   Problem: Activity: Goal: Interest or engagement in leisure activities will improve Outcome: Progressing   Problem: Coping: Goal: Coping ability will improve Outcome: Progressing

## 2022-10-29 NOTE — Progress Notes (Signed)
Patient calm and cooperative during assessment denying SI/HI/AVH. Patient observed interacting appropriately with staff and peers on the unit. Patient compliant with medication administration per MD orders. Pt being monitored Q 15 minutes for safety per unit protocol, remains safe on the unit  

## 2022-10-29 NOTE — Progress Notes (Signed)
[facility] MD Progress Note  10/29/2022 1:52 PM Adam [name]  MRN:  [id number] Subjective: No change.  No subjective statements.  No evidence of distress. 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)   Stroke (HCC)   HTN (hypertension)   HLD (hyperlipidemia)   CAD (coronary artery disease)   Chronic systolic CHF (congestive heart failure) (HCC)   Anxiety   Infection of left earlobe  Total Time spent with patient: 20 minutes  Past Psychiatric History: Past history of schizophrenia  Past Medical History:  Past Medical History:  Diagnosis Date   Myocardial infarction (HCC)    Schizophrenia (HCC)    Stroke (HCC)     Past Surgical History:  Procedure Laterality Date   NO PAST SURGERIES     Family History:  Family History  Problem Relation Age of Onset   Hypertension Mother    Family Psychiatric  History: See previous Social History:  Social History   Substance and Sexual Activity  Alcohol Use Not Currently     Social History   Substance and Sexual Activity  Drug Use Not Currently    Social History   Socioeconomic History   Marital status: Single    Spouse name: Not on file   Number of children: Not on file   Years of education: Not on file   Highest education level: Not on file  Occupational History   Not on file  Tobacco Use   Smoking status: Never    Passive exposure: Never   Smokeless tobacco: Never  Vaping Use   Vaping Use: Unknown  Substance and Sexual Activity   Alcohol use: Not Currently   Drug use: Not Currently   Sexual activity: Not Currently  Other Topics Concern   Not on file  Social History Narrative   Not on file   Social Determinants of Health   Financial Resource Strain: Not on file  Food Insecurity: Not on file   Transportation Needs: Not on file  Physical Activity: Not on file  Stress: Not on file  Social Connections: Not on file   Additional Social History:  Specify valuables returned:  (none)                      Sleep: Fair  Appetite:  Fair  Current Medications: Current Facility-Administered Medications  Medication Dose Route Frequency Provider Last Rate Last Admin   acetaminophen (TYLENOL) tablet 650 mg  650 mg Oral Q6H PRN Burnett Spray T, MD   650 mg at 10/14/22 2328   alum & mag hydroxide-simeth (MAALOX/MYLANTA) 200-200-20 MG/5ML suspension 30 mL  30 mL Oral Q4H PRN Onell Mcmath T, MD   30 mL at 04/26/22 4098   aspirin EC tablet 81 mg  81 mg Oral Daily Gal Smolinski, [name], MD   81 mg at 10/29/22 0849   atorvastatin (LIPITOR) tablet 20 mg  20 mg Oral Daily Jahzion Brogden T, MD   20 mg at 10/29/22 0849   atropine 1 % ophthalmic solution 1 drop  1 drop Sublingual QID [name], MD   1 drop at 10/29/22 1202   benztropine (COGENTIN) tablet 0.5 mg  0.5 mg Oral BID Trevor Wilkie T, MD   0.5 mg at 10/29/22 0849   clonazePAM (KLONOPIN) tablet 1 mg  1 mg Oral TID PRN He, [name], MD   1 mg  at 10/14/22 1105   cloZAPine (CLOZARIL) tablet 300 mg  300 mg Oral QHS Brighten Orndoff T, MD   300 mg at 10/28/22 2131   diphenhydrAMINE (BENADRYL) capsule 50 mg  50 mg Oral Q6H PRN Kamoni Gentles, [name], MD   50 mg at 09/30/22 2119   Or   diphenhydrAMINE (BENADRYL) injection 50 mg  50 mg Intramuscular Q6H PRN Jenaya Saar T, MD       gabapentin (NEURONTIN) capsule 300 mg  300 mg Oral TID Adir Schicker, [name], MD   300 mg at 10/29/22 0849   haloperidol (HALDOL) tablet 2 mg  2 mg Oral TID Hibah Odonnell, [name], MD   2 mg at 10/29/22 0850   haloperidol (HALDOL) tablet 5 mg  5 mg Oral Q6H PRN Yechiel Erny T, MD   5 mg at 09/17/22 1532   Or   haloperidol lactate (HALDOL) injection 5 mg  5 mg Intramuscular Q6H PRN Omarii Scalzo T, MD       lisinopril (ZESTRIL) tablet 5 mg  5 mg Oral Daily Avie Checo T, MD   5 mg at  10/29/22 0849   magnesium hydroxide (MILK OF MAGNESIA) suspension 30 mL  30 mL Oral Daily PRN Rosmarie Esquibel T, MD   30 mL at 09/15/22 1047   metoprolol succinate (TOPROL-XL) 24 hr tablet 25 mg  25 mg Oral Daily Eboney Claybrook T, MD   25 mg at 10/29/22 0850   ondansetron (ZOFRAN) tablet 4 mg  4 mg Oral Q8H PRN Lerry Liner M, NP   4 mg at 09/06/22 2320   paliperidone (INVEGA SUSTENNA) injection 156 mg  156 mg Intramuscular Q28 days Pricella Gaugh T, MD   156 mg at 10/23/22 1715   paliperidone (INVEGA) 24 hr tablet 6 mg  6 mg Oral QHS Nayquan Evinger T, MD   6 mg at 10/28/22 2131   senna-docusate (Senokot-S) tablet 2 tablet  2 tablet Oral QHS PRN Sarina Ill, DO   2 tablet at 09/15/22 2110   temazepam (RESTORIL) capsule 15 mg  15 mg Oral QHS Aymen Widrig T, MD   15 mg at 10/28/22 2131   ziprasidone (GEODON) injection 20 mg  20 mg Intramuscular Q12H PRN Tehillah Cipriani, [name], MD   20 mg at 07/08/22 1453    Lab Results: No results found for this or any previous visit (from the past 48 hour(s)).  Blood Alcohol level:  Lab Results  Component Value Date   ETH <10 07/20/2021    Metabolic Disorder Labs: Lab Results  Component Value Date   HGBA1C 5.5 04/10/2022   MPG 111.15 04/10/2022   No results found for: "PROLACTIN" Lab Results  Component Value Date   CHOL 167 11/20/2021   TRIG 107 11/20/2021   HDL 26 (L) 11/20/2021   CHOLHDL 6.4 11/20/2021   VLDL 21 11/20/2021   LDLCALC 120 (H) 11/20/2021    Physical Findings: AIMS: Facial and Oral Movements Muscles of Facial Expression: None, normal Lips and Perioral Area: None, normal Jaw: None, normal Tongue: None, normal,Extremity Movements Upper (arms, wrists, hands, fingers): None, normal Lower (legs, knees, ankles, toes): None, normal, Trunk Movements Neck, shoulders, hips: None, normal, Overall Severity Severity of abnormal movements (highest score from questions above): None, normal Incapacitation due to abnormal movements:  None, normal Patient's awareness of abnormal movements (rate only patient's report): No Awareness, Dental Status Current problems with teeth and/or dentures?: No Does patient usually wear dentures?: No  CIWA:    COWS:  Musculoskeletal: Strength & Muscle Tone: within normal limits Gait & Station: normal Patient leans: N/A  Psychiatric Specialty Exam:  Presentation  General Appearance:  Appropriate for Environment; Casual; Neat; Well Groomed  Eye Contact: Fair  Speech: Slow  Speech Volume: Decreased  Handedness: Left   Mood and Affect  Mood: Anxious  Affect: Congruent   Thought Process  Thought Processes: Goal Directed  Descriptions of Associations:Circumstantial  Orientation:Partial  Thought Content:Scattered  History of Schizophrenia/Schizoaffective disorder:Yes  Duration of Psychotic Symptoms:Greater than six months  Hallucinations:No data recorded Ideas of Reference:None  Suicidal Thoughts:No data recorded Homicidal Thoughts:No data recorded  Sensorium  Memory: Remote Good  Judgment: Fair  Insight: Fair   Art therapist  Concentration: Fair  Attention Span: Fair  Recall: Fair  Fund of Knowledge: Fair  Language: Fair   Psychomotor Activity  Psychomotor Activity:No data recorded  Assets  Assets:No data recorded  Sleep  Sleep:No data recorded   Physical Exam: Physical Exam Vitals reviewed.  Constitutional:      Appearance: Normal appearance.  HENT:     Head: Normocephalic and atraumatic.     Mouth/Throat:     Pharynx: Oropharynx is clear.  Eyes:     Pupils: Pupils are equal, round, and reactive to light.  Cardiovascular:     Rate and Rhythm: Normal rate and regular rhythm.  Pulmonary:     Effort: Pulmonary effort is normal.     Breath sounds: Normal breath sounds.  Abdominal:     General: Abdomen is flat.     Palpations: Abdomen is soft.  Musculoskeletal:        General: Normal range of motion.   Skin:    General: Skin is warm and dry.  Neurological:     General: No focal deficit present.     Mental Status: He is alert. Mental status is at baseline.  Psychiatric:        Mood and Affect: Mood normal.        Thought Content: Thought content normal.    Review of Systems  Unable to perform ROS: Language   Blood pressure 109/85, pulse 80, temperature 97.8 F (36.6 C), resp. rate 18, height 5\' 8"  (1.727 m), weight 85.8 kg, SpO2 96 %. Body mass index is 28.76 kg/m.   Treatment Plan Summary: No indication for any change of treatment.  Entirely waiting for discharge  , MD 10/29/2022, 1:52 PM

## 2022-10-29 NOTE — Progress Notes (Signed)
Presents pleasant this morning with no concerns voiced. He is med compliant, eating 100% of his meals thus far with no behavioral issues. He is engaging in some of the units groups and occasionally using the phone. He ambulates with a steady gait with no falls or unsafe behavior noted thus far. Q15 min observations maintained for safety and support provided as needed.

## 2022-10-29 NOTE — Group Note (Signed)
[facility] LCSW Group Therapy Note    Group Date: 10/29/2022 Start Time: 1300 End Time: 1400  Type of Therapy and Topic:  Group Therapy:  Overcoming Obstacles  Participation Level:  [facility] PARTICIPATION LEVEL: None  Mood:  Description of Group:   In this group patients will be encouraged to explore what they see as obstacles to their own wellness and recovery. They will be guided to discuss their thoughts, feelings, and behaviors related to these obstacles. The group will process together ways to cope with barriers, with attention given to specific choices patients can make. Each patient will be challenged to identify changes they are motivated to make in order to overcome their obstacles. This group will be process-oriented, with patients participating in exploration of their own experiences as well as giving and receiving support and challenge from other group members.  Therapeutic Goals: 1. Patient will identify personal and current obstacles as they relate to admission. 2. Patient will identify barriers that currently interfere with their wellness or overcoming obstacles.  3. Patient will identify feelings, thought process and behaviors related to these barriers. 4. Patient will identify two changes they are willing to make to overcome these obstacles:    Summary of Patient Progress Patient was identify "triggers" as his obstacles.  Patient later begin to state "Ritchie" when music as a coping skill was mentioned.   Therapeutic Modalities:   Cognitive Behavioral Therapy Solution Focused Therapy Motivational Interviewing Relapse Prevention Therapy   Harden Mo, LCSW

## 2022-10-30 MED ORDER — ENSURE ENLIVE PO LIQD
237.0000 mL | Freq: Two times a day (BID) | ORAL | Status: DC
  Administered 2022-10-31 – 2023-01-02 (×104): 237 mL via ORAL
  Administered 2023-01-03: 474 mL via ORAL
  Administered 2023-01-04 – 2023-02-19 (×83): 237 mL via ORAL

## 2022-10-30 NOTE — Progress Notes (Signed)
Patient calm and cooperative during assessment denying SI/HI/AVH. Patient observed interacting appropriately with staff and peers on the unit. Patient did get frustrated during snack with another patient. After that the patient refused snack and his night time medication. Pt given education, but still refused. Pt being monitored Q 15 minutes for safety per unit protocol, remains safe on the unit

## 2022-10-30 NOTE — Progress Notes (Signed)
Recreation Therapy Notes  Date: 10/30/2022  Time: 2:05pm     Location: Court yard   Behavioral response: N/A  Intervention Topic: Leisure   Discussion/Intervention: Patient refused to attend group.   Clinical Observations/Feedback:  Patient refused to attend group.    Antoneo Ghrist LRT/CTRS        Adam [name] 10/30/2022 3:08 PM 

## 2022-10-30 NOTE — Group Note (Signed)
LCSW Group Therapy Note   Group Date: 10/30/2022 Start Time: 1300 End Time: 1400   Type of Therapy and Topic:  Group Therapy: Challenging Core Beliefs  Participation Level:  Minimal  Description of Group:  Patients were educated about core beliefs and asked to identify one harmful core belief that they have. Patients were asked to explore from where those beliefs originate. Patients were asked to discuss how those beliefs make them feel and the resulting behaviors of those beliefs. They were then be asked if those beliefs are true and, if so, what evidence they have to support them. Lastly, group members were challenged to replace those negative core beliefs with helpful beliefs.   Therapeutic Goals:   1. Patient will identify harmful core beliefs and explore the origins of such beliefs. 2. Patient will identify feelings and behaviors that result from those core beliefs. 3. Patient will discuss whether such beliefs are true. 4.  Patient will replace harmful core beliefs with helpful ones.  Summary of Patient Progress:   Patient was present for the entirety of group session. Patient participated in opening and closing remarks. However, patient did not contribute at all to the topic of discussion despite encouraged participation.   Therapeutic Modalities: Cognitive Behavioral Therapy; Solution-Focused Therapy   Corky Crafts, Theresia Majors 10/30/2022  2:08 PM

## 2022-10-30 NOTE — Plan of Care (Signed)
Patient presents with his normal behaviors in the unit. No issues verbalized. Compliant with medications and meals. Appetite and energy level good. Support and encouragement given.

## 2022-10-30 NOTE — Progress Notes (Signed)
[facility] MD Progress Note  10/30/2022 2:10 PM Adam [name]  MRN:  [id number] Subjective: No change.  No new complaints.  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)   Stroke (HCC)   HTN (hypertension)   HLD (hyperlipidemia)   CAD (coronary artery disease)   Chronic systolic CHF (congestive heart failure) (HCC)   Anxiety   Infection of left earlobe  Total Time spent with patient: 20 minutes  Past Psychiatric History: History of schizophrenia and stroke  Past Medical History:  Past Medical History:  Diagnosis Date   Myocardial infarction (HCC)    Schizophrenia (HCC)    Stroke (HCC)     Past Surgical History:  Procedure Laterality Date   NO PAST SURGERIES     Family History:  Family History  Problem Relation Age of Onset   Hypertension Mother    Family Psychiatric  History: See previous Social History:  Social History   Substance and Sexual Activity  Alcohol Use Not Currently     Social History   Substance and Sexual Activity  Drug Use Not Currently    Social History   Socioeconomic History   Marital status: Single    Spouse name: Not on file   Number of children: Not on file   Years of education: Not on file   Highest education level: Not on file  Occupational History   Not on file  Tobacco Use   Smoking status: Never    Passive exposure: Never   Smokeless tobacco: Never  Vaping Use   Vaping Use: Unknown  Substance and Sexual Activity   Alcohol use: Not Currently   Drug use: Not Currently   Sexual activity: Not Currently  Other Topics Concern   Not on file  Social History Narrative   Not on file   Social Determinants of Health   Financial Resource Strain: Not on file  Food Insecurity: Not on file   Transportation Needs: Not on file  Physical Activity: Not on file  Stress: Not on file  Social Connections: Not on file   Additional Social History:  Specify valuables returned:  (none)                      Sleep: Fair  Appetite:  Fair  Current Medications: Current Facility-Administered Medications  Medication Dose Route Frequency Provider Last Rate Last Admin   acetaminophen (TYLENOL) tablet 650 mg  650 mg Oral Q6H PRN Jahnavi Muratore T, MD   650 mg at 10/14/22 2328   alum & mag hydroxide-simeth (MAALOX/MYLANTA) 200-200-20 MG/5ML suspension 30 mL  30 mL Oral Q4H PRN Rykker Coviello T, MD   30 mL at 04/26/22 0263   aspirin EC tablet 81 mg  81 mg Oral Daily Kassi Esteve, [name], MD   81 mg at 10/30/22 0813   atorvastatin (LIPITOR) tablet 20 mg  20 mg Oral Daily Alessandra Sawdey T, MD   20 mg at 10/30/22 0814   atropine 1 % ophthalmic solution 1 drop  1 drop Sublingual QID [name], MD   1 drop at 10/30/22 1202   benztropine (COGENTIN) tablet 0.5 mg  0.5 mg Oral BID Caryl Fate T, MD   0.5 mg at 10/30/22 0814   clonazePAM (KLONOPIN) tablet 1 mg  1 mg Oral TID PRN He, [name], MD   1  mg at 10/14/22 1105   cloZAPine (CLOZARIL) tablet 300 mg  300 mg Oral QHS Kollins Fenter T, MD   300 mg at 10/29/22 2145   diphenhydrAMINE (BENADRYL) capsule 50 mg  50 mg Oral Q6H PRN Derryl Uher, [name], MD   50 mg at 09/30/22 2119   Or   diphenhydrAMINE (BENADRYL) injection 50 mg  50 mg Intramuscular Q6H PRN Diamond Jentz T, MD       gabapentin (NEURONTIN) capsule 300 mg  300 mg Oral TID Annjanette Wertenberger T, MD   300 mg at 10/30/22 1003   haloperidol (HALDOL) tablet 2 mg  2 mg Oral TID Sonny Anthes T, MD   2 mg at 10/30/22 1003   haloperidol (HALDOL) tablet 5 mg  5 mg Oral Q6H PRN Ahnesty Finfrock T, MD   5 mg at 09/17/22 1532   Or   haloperidol lactate (HALDOL) injection 5 mg  5 mg Intramuscular Q6H PRN Neziah Vogelgesang T, MD       lisinopril (ZESTRIL) tablet 5 mg  5 mg Oral Daily Kiira Brach T, MD   5 mg at  10/30/22 0814   magnesium hydroxide (MILK OF MAGNESIA) suspension 30 mL  30 mL Oral Daily PRN Dontrae Morini T, MD   30 mL at 09/15/22 1047   metoprolol succinate (TOPROL-XL) 24 hr tablet 25 mg  25 mg Oral Daily Rosamund Nyland T, MD   25 mg at 10/30/22 0814   ondansetron (ZOFRAN) tablet 4 mg  4 mg Oral Q8H PRN [name], NP   4 mg at 09/06/22 2320   paliperidone (INVEGA SUSTENNA) injection 156 mg  156 mg Intramuscular Q28 days Unity Luepke T, MD   156 mg at 10/23/22 1715   paliperidone (INVEGA) 24 hr tablet 6 mg  6 mg Oral QHS Kais Monje T, MD   6 mg at 10/29/22 2146   senna-docusate (Senokot-S) tablet 2 tablet  2 tablet Oral QHS PRN [name], DO   2 tablet at 09/15/22 2110   temazepam (RESTORIL) capsule 15 mg  15 mg Oral QHS Tyrez Berrios T, MD   15 mg at 10/29/22 2145   ziprasidone (GEODON) injection 20 mg  20 mg Intramuscular Q12H PRN Kerly Rigsbee, [name], MD   20 mg at 07/08/22 1453    Lab Results: No results found for this or any previous visit (from the past 48 hour(s)).  Blood Alcohol level:  Lab Results  Component Value Date   ETH <10 [id number]    Metabolic Disorder Labs: Lab Results  Component Value Date   HGBA1C 5.5 04/10/2022   MPG 111.15 04/10/2022   No results found for: "PROLACTIN" Lab Results  Component Value Date   CHOL 167 11/20/2021   TRIG 107 11/20/2021   HDL 26 (L) 11/20/2021   CHOLHDL 6.4 11/20/2021   VLDL 21 11/20/2021   LDLCALC 120 (H) 11/20/2021    Physical Findings: AIMS: Facial and Oral Movements Muscles of Facial Expression: None, normal Lips and Perioral Area: None, normal Jaw: None, normal Tongue: None, normal,Extremity Movements Upper (arms, wrists, hands, fingers): None, normal Lower (legs, knees, ankles, toes): None, normal, Trunk Movements Neck, shoulders, hips: None, normal, Overall Severity Severity of abnormal movements (highest score from questions above): None, normal Incapacitation due to abnormal movements:  None, normal Patient's awareness of abnormal movements (rate only patient's report): No Awareness, Dental Status Current problems with teeth and/or dentures?: No Does patient usually wear dentures?: No  CIWA:    COWS:  Musculoskeletal: Strength & Muscle Tone: within normal limits Gait & Station: normal Patient leans: N/A  Psychiatric Specialty Exam:  Presentation  General Appearance:  Appropriate for Environment; Casual; Neat; Well Groomed  Eye Contact: Fair  Speech: Slow  Speech Volume: Decreased  Handedness: Left   Mood and Affect  Mood: Anxious  Affect: Congruent   Thought Process  Thought Processes: Goal Directed  Descriptions of Associations:Circumstantial  Orientation:Partial  Thought Content:Scattered  History of Schizophrenia/Schizoaffective disorder:Yes  Duration of Psychotic Symptoms:Greater than six months  Hallucinations:No data recorded Ideas of Reference:None  Suicidal Thoughts:No data recorded Homicidal Thoughts:No data recorded  Sensorium  Memory: Remote Good  Judgment: Fair  Insight: Fair   Community education officer  Concentration: Fair  Attention Span: Fair  Recall: [place] of Knowledge: Fair  Language: Fair   Psychomotor Activity  Psychomotor Activity:No data recorded  Assets  Assets:No data recorded  Sleep  Sleep:No data recorded   Physical Exam: Physical Exam Vitals reviewed.  Constitutional:      Appearance: Normal appearance.  HENT:     Head: Normocephalic and atraumatic.     Mouth/Throat:     Pharynx: Oropharynx is clear.  Eyes:     Pupils: Pupils are equal, round, and reactive to light.  Cardiovascular:     Rate and Rhythm: Normal rate and regular rhythm.  Pulmonary:     Effort: Pulmonary effort is normal.     Breath sounds: Normal breath sounds.  Abdominal:     General: Abdomen is flat.     Palpations: Abdomen is soft.  Musculoskeletal:        General: Normal range of motion.   Skin:    General: Skin is warm and dry.  Neurological:     General: No focal deficit present.     Mental Status: He is alert. Mental status is at baseline.  Psychiatric:        Attention and Perception: He is inattentive.        Mood and Affect: Affect is blunt.        Speech: He is noncommunicative.    Review of Systems  Unable to perform ROS: Language   Blood pressure 104/70, pulse 89, temperature 98 F (36.7 C), temperature source Oral, resp. rate 18, height 5\' 8"  (1.727 m), weight 85.8 kg, SpO2 96 %. Body mass index is 28.76 kg/m.   Treatment Plan Summary: Medication management and Plan no change to medication.  Supportive encouragement.  There seems to be no progress this week on finding him a place to live which is still the only real problem.  Alethia Berthold, MD 10/30/2022, 2:10 PM

## 2022-10-30 NOTE — Progress Notes (Signed)
Patient came up and asked this nurse for his night medications. Compliant with medication administration per MD orders. Pt remains safe on the unit

## 2022-10-30 NOTE — [facility] Counselor (Signed)
CSW attempted to speak to [place] at [place].  CSW unable to leave HIPAA compliant voicemail or speak with [place].  [name], MSW, LCSW 10/30/2022 3:57 PM

## 2022-10-31 NOTE — Progress Notes (Signed)
Recreation Therapy Notes  Date: 10/31/2022   Time: 10:50 am   Location: Craft room    Behavioral response: N/A   Intervention Topic: Values    Discussion/Intervention: Patient refused to attend group.    Clinical Observations/Feedback:  Patient refused to attend group.    Eaven Schwager LRT/CTRS          Hannahmarie Asberry 10/31/2022 11:15 AM

## 2022-10-31 NOTE — [facility] Counselor (Signed)
CSW again attempted to reach [place] with [place].  CSW left HIPAA compliant voicemail requesting a return call.  [name], MSW, LCSW 10/31/2022 11:12 AM

## 2022-10-31 NOTE — Progress Notes (Signed)
[facility] MD Progress Note  10/31/2022 3:52 PM Adam [name]  MRN:  [id number] Subjective: Follow-up for this man with schizophrenia.  No change condition.  No change to behavior. 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)   Stroke (HCC)   HTN (hypertension)   HLD (hyperlipidemia)   CAD (coronary artery disease)   Chronic systolic CHF (congestive heart failure) (HCC)   Anxiety   Infection of left earlobe  Total Time spent with patient: 30 minutes  Past Psychiatric History: Past history of schizophrenia  Past Medical History:  Past Medical History:  Diagnosis Date   Myocardial infarction (HCC)    Schizophrenia (HCC)    Stroke (HCC)     Past Surgical History:  Procedure Laterality Date   NO PAST SURGERIES     Family History:  Family History  Problem Relation Age of Onset   Hypertension Mother    Family Psychiatric  History: See previous Social History:  Social History   Substance and Sexual Activity  Alcohol Use Not Currently     Social History   Substance and Sexual Activity  Drug Use Not Currently    Social History   Socioeconomic History   Marital status: Single    Spouse name: Not on file   Number of children: Not on file   Years of education: Not on file   Highest education level: Not on file  Occupational History   Not on file  Tobacco Use   Smoking status: Never    Passive exposure: Never   Smokeless tobacco: Never  Vaping Use   Vaping Use: Unknown  Substance and Sexual Activity   Alcohol use: Not Currently   Drug use: Not Currently   Sexual activity: Not Currently  Other Topics Concern   Not on file  Social History Narrative   Not on file   Social Determinants of Health   Financial Resource Strain: Not on file  Food  Insecurity: Not on file  Transportation Needs: Not on file  Physical Activity: Not on file  Stress: Not on file  Social Connections: Not on file   Additional Social History:  Specify valuables returned:  (none)                      Sleep: Fair  Appetite:  Fair  Current Medications: Current Facility-Administered Medications  Medication Dose Route Frequency Provider Last Rate Last Admin   acetaminophen (TYLENOL) tablet 650 mg  650 mg Oral Q6H PRN Albino Bufford T, MD   650 mg at 10/14/22 2328   alum & mag hydroxide-simeth (MAALOX/MYLANTA) 200-200-20 MG/5ML suspension 30 mL  30 mL Oral Q4H PRN Yilin Weedon T, MD   30 mL at 04/26/22 6440   aspirin EC tablet 81 mg  81 mg Oral Daily Sricharan Lacomb, [name], MD   81 mg at 10/31/22 3474   atorvastatin (LIPITOR) tablet 20 mg  20 mg Oral Daily Saifan Rayford T, MD   20 mg at 10/31/22 0826   atropine 1 % ophthalmic solution 1 drop  1 drop Sublingual QID [name], MD   1 drop at 10/31/22 1239   benztropine (COGENTIN) tablet 0.5 mg  0.5 mg Oral BID Alicianna Litchford, [name], MD   0.5 mg at 10/31/22 0826   clonazePAM (KLONOPIN) tablet 1 mg  1 mg Oral TID PRN He, [name], MD  1 mg at 10/14/22 1105   cloZAPine (CLOZARIL) tablet 300 mg  300 mg Oral QHS Alfonso Shackett T, MD   300 mg at 10/30/22 2158   diphenhydrAMINE (BENADRYL) capsule 50 mg  50 mg Oral Q6H PRN Susann Lawhorne T, MD   50 mg at 09/30/22 2119   Or   diphenhydrAMINE (BENADRYL) injection 50 mg  50 mg Intramuscular Q6H PRN Kadisha Goodine T, MD       feeding supplement (ENSURE ENLIVE / ENSURE PLUS) liquid 237 mL  237 mL Oral BID BM Brealyn Baril T, MD   237 mL at 10/31/22 0945   gabapentin (NEURONTIN) capsule 300 mg  300 mg Oral TID Karas Pickerill T, MD   300 mg at 10/31/22 1236   haloperidol (HALDOL) tablet 2 mg  2 mg Oral TID Srihaan Mastrangelo T, MD   2 mg at 10/31/22 1236   haloperidol (HALDOL) tablet 5 mg  5 mg Oral Q6H PRN Jamyia Fortune T, MD   5 mg at 09/17/22 1532   Or   haloperidol lactate (HALDOL)  injection 5 mg  5 mg Intramuscular Q6H PRN Kimari Lienhard T, MD       lisinopril (ZESTRIL) tablet 5 mg  5 mg Oral Daily Andrell Tallman T, MD   5 mg at 10/31/22 7793   magnesium hydroxide (MILK OF MAGNESIA) suspension 30 mL  30 mL Oral Daily PRN Mari Battaglia T, MD   30 mL at 09/15/22 1047   metoprolol succinate (TOPROL-XL) 24 hr tablet 25 mg  25 mg Oral Daily My Madariaga T, MD   25 mg at 10/31/22 0826   ondansetron (ZOFRAN) tablet 4 mg  4 mg Oral Q8H PRN Lerry Liner M, NP   4 mg at 09/06/22 2320   paliperidone (INVEGA SUSTENNA) injection 156 mg  156 mg Intramuscular Q28 days Jersey Ravenscroft T, MD   156 mg at 10/23/22 1715   paliperidone (INVEGA) 24 hr tablet 6 mg  6 mg Oral QHS Jamieson Lisa T, MD   6 mg at 10/30/22 2158   senna-docusate (Senokot-S) tablet 2 tablet  2 tablet Oral QHS PRN Sarina Ill, DO   2 tablet at 09/15/22 2110   temazepam (RESTORIL) capsule 15 mg  15 mg Oral QHS Shirlean Berman T, MD   15 mg at 10/30/22 2158   ziprasidone (GEODON) injection 20 mg  20 mg Intramuscular Q12H PRN Johna Kearl, [name], MD   20 mg at 07/08/22 1453    Lab Results: No results found for this or any previous visit (from the past 48 hour(s)).  Blood Alcohol level:  Lab Results  Component Value Date   ETH <10 07/20/2021    Metabolic Disorder Labs: Lab Results  Component Value Date   HGBA1C 5.5 04/10/2022   MPG 111.15 04/10/2022   No results found for: "PROLACTIN" Lab Results  Component Value Date   CHOL 167 11/20/2021   TRIG 107 11/20/2021   HDL 26 (L) 11/20/2021   CHOLHDL 6.4 11/20/2021   VLDL 21 11/20/2021   LDLCALC 120 (H) 11/20/2021    Physical Findings: AIMS: Facial and Oral Movements Muscles of Facial Expression: None, normal Lips and Perioral Area: None, normal Jaw: None, normal Tongue: None, normal,Extremity Movements Upper (arms, wrists, hands, fingers): None, normal Lower (legs, knees, ankles, toes): None, normal, Trunk Movements Neck, shoulders, hips: None,  normal, Overall Severity Severity of abnormal movements (highest score from questions above): None, normal Incapacitation due to abnormal movements: None, normal Patient's awareness of abnormal movements (  rate only patient's report): No Awareness, Dental Status Current problems with teeth and/or dentures?: No Does patient usually wear dentures?: No  CIWA:    COWS:     Musculoskeletal: Strength & Muscle Tone: within normal limits Gait & Station: normal Patient leans: N/A  Psychiatric Specialty Exam:  Presentation  General Appearance:  Appropriate for Environment; Casual; Neat; Well Groomed  Eye Contact: Fair  Speech: Slow  Speech Volume: Decreased  Handedness: Left   Mood and Affect  Mood: Anxious  Affect: Congruent   Thought Process  Thought Processes: Goal Directed  Descriptions of Associations:Circumstantial  Orientation:Partial  Thought Content:Scattered  History of Schizophrenia/Schizoaffective disorder:Yes  Duration of Psychotic Symptoms:Greater than six months  Hallucinations:No data recorded Ideas of Reference:None  Suicidal Thoughts:No data recorded Homicidal Thoughts:No data recorded  Sensorium  Memory: Remote Good  Judgment: Fair  Insight: Fair   Art therapist  Concentration: Fair  Attention Span: Fair  Recall: [employer] of Knowledge: Fair  Language: Fair   Psychomotor Activity  Psychomotor Activity:No data recorded  Assets  Assets:No data recorded  Sleep  Sleep:No data recorded   Physical Exam: Physical Exam Vitals and nursing note reviewed.  Constitutional:      Appearance: Normal appearance.  HENT:     Head: Normocephalic and atraumatic.     Mouth/Throat:     Pharynx: Oropharynx is clear.  Eyes:     Pupils: Pupils are equal, round, and reactive to light.  Cardiovascular:     Rate and Rhythm: Normal rate and regular rhythm.  Pulmonary:     Effort: Pulmonary effort is normal.     Breath  sounds: Normal breath sounds.  Abdominal:     General: Abdomen is flat.     Palpations: Abdomen is soft.  Musculoskeletal:        General: Normal range of motion.  Skin:    General: Skin is warm and dry.  Neurological:     General: No focal deficit present.     Mental Status: He is alert. Mental status is at baseline.  Psychiatric:        Attention and Perception: He is inattentive.        Speech: He is noncommunicative.    Review of Systems  Unable to perform ROS: Language   Blood pressure 122/85, pulse 84, temperature 98 F (36.7 C), temperature source Oral, resp. rate 20, height 5\' 8"  (1.727 m), weight 85.8 kg, SpO2 100 %. Body mass index is 28.76 kg/m.   Treatment Plan Summary: Plan completely stable.  No change to treatment plan.  Still awaiting placement  , MD 10/31/2022, 3:52 PM

## 2022-10-31 NOTE — Progress Notes (Signed)
Pt noted with bright affect and animated mood. Observed to be fidgety /restless with intermittent pacing but without outburst thus far. Remains medications compliant without issues. Did not attend any scheduled groups despite multiple verbal prompts "No, no". Safety checks maintained at Q 15 minutes intervals without incident. Verbal education done on current treatment regimen, pt verbalized understanding. Support, encouragement and reassurance offered to pt. Pt attended some scheduled unit group, engaged appropriately with others. Pt tolerates medications, meals and fluids well without discomfort. Safety maintained in milieu.

## 2022-10-31 NOTE — [facility] Counselor (Signed)
CSW team met with [facility] representatives (K. Baldwin-Soles and L. Oxendine) and TOC Supervisor Mayer Masker) for weekly meeting to address placement.   CSW updated that [facility] Ms. Kennyth Lose had reached out to stated that she had talked with Ms. Tammy and the paperwork would be completed.   Orlean Patten updated that contact attempts were being made for placement (via [facility]) and there is one potential, [facility] had no bed availability at contact but was expecting a discharge this week.   CSW also updated that attempts had been made to contact [facility] [place] without results.   [facility] asked for an update on pt's behavior. They were informed that there have been no incidents of aggression or acting out. Baldwin-Soles shares that this would be passed on to A. [facility] for potential placement updates.   Oxendine to attempt contact with "[facility]" to explain the process. CSW to continue to reach out to [employer]. [facility] Howard to continue search for other placement options.   No other concerns expressed. Contact ended without incident.   [name]. Guerry Bruin, MSW, LCSW, [place] 10/31/2022 10:56 AM

## 2022-10-31 NOTE — [facility] Group Notes (Signed)
[facility] Group Notes:  (Nursing/MHT/Case Management/Adjunct)  Date:  10/31/2022  Time:  9:47 AM  Type of Therapy:   Community Meeting  Participation Level:  Did Not Attend   Nida Manfredi Travis Dhwani Venkatesh 10/31/2022, 9:47 AM 

## 2022-10-31 NOTE — Group Note (Signed)
[facility] LCSW Group Therapy Note   Group Date: 10/31/2022 Start Time: 1310 End Time: 1420   Type of Therapy/Topic:  Group Therapy:  Emotion Regulation  Participation Level:  None    Description of Group:    The purpose of this group is to assist patients in learning to regulate negative emotions and experience positive emotions. Patients will be guided to discuss ways in which they have been vulnerable to their negative emotions. These vulnerabilities will be juxtaposed with experiences of positive emotions or situations, and patients challenged to use positive emotions to combat negative ones. Special emphasis will be placed on coping with negative emotions in conflict situations, and patients will process healthy conflict resolution skills.  Therapeutic Goals: Patient will identify two positive emotions or experiences to reflect on in order to balance out negative emotions:  Patient will label two or more emotions that they find the most difficult to experience:  Patient will be able to demonstrate positive conflict resolution skills through discussion or role plays:   Summary of Patient Progress: Pt came in during the end of the discussion. He did not participate in the discussion outside of correcting facilitator that he would like to be called Abel today by saying "Gleed" when called "Tracker" by facilitator.    Therapeutic Modalities:   Cognitive Behavioral Therapy Feelings Identification Dialectical Behavioral Therapy   Glenis Smoker, LCSW

## 2022-10-31 NOTE — Plan of Care (Signed)
Pt affect, behavior and cooperation are at baseline, no unusual activity or agitation observed.  Pt is med compliant.  Pt denies SI HI AVH.  Continued monitoring for safety via Q 15 m checks.

## 2022-11-01 NOTE — Progress Notes (Signed)
Recreation Therapy Notes   Date: 11/01/2022  Time: 10:45 am     Location: Craft room   Behavioral response: N/A   Intervention Topic: Stress Management   Discussion/Intervention: Patient refused to attend group.   Clinical Observations/Feedback:  Patient refused to attend group.    Danice Dippolito LRT/CTRS        Layla Gramm 11/01/2022 12:17 PM

## 2022-11-01 NOTE — Plan of Care (Signed)
Patient alert and awake early this morning. Observed in room and pacing in hallway. Patient med compliant and at baseline. Patient tends to demand attention but not aggressive and is redirectable. Patient has had no outbursts and remains cooperative on unit.    Problem: Safety: Goal: Ability to remain free from injury will improve 11/01/2022 1448 by Roseanne Reno, RN Outcome: Progressing 11/01/2022 1443 by Roseanne Reno, RN Outcome: Progressing   Problem: Coping: Goal: Coping ability will improve Outcome: Progressing

## 2022-11-01 NOTE — Group Note (Signed)
[facility] LCSW Group Therapy Note   Group Date: 11/01/2022 Start Time: 1300 End Time: 1400   Type of Therapy/Topic:  Group Therapy:  Balance in Life  Participation Level:  None   Description of Group:    This group will address the concept of balance and how it feels and looks when one is unbalanced. Patients will be encouraged to process areas in their lives that are out of balance, and identify reasons for remaining unbalanced. Facilitators will guide patients utilizing problem- solving interventions to address and correct the stressor making their life unbalanced. Understanding and applying boundaries will be explored and addressed for obtaining  and maintaining a balanced life. Patients will be encouraged to explore ways to assertively make their unbalanced needs known to significant others in their lives, using other group members and facilitator for support and feedback.  Therapeutic Goals: Patient will identify two or more emotions or situations they have that consume much of in their lives. Patient will identify signs/triggers that life has become out of balance:  Patient will identify two ways to set boundaries in order to achieve balance in their lives:  Patient will demonstrate ability to communicate their needs through discussion and/or role plays  Summary of Patient Progress: Patient attended group, however, patient sat with his hands over his ears.  Patient moaned and groaned throughout group and often stomped his foot as if upset.  Patient did eventually leave group.  Therapeutic Modalities:   Cognitive Behavioral Therapy Solution-Focused Therapy Assertiveness Training   Harden Mo, LCSW

## 2022-11-01 NOTE — Progress Notes (Signed)
[facility] MD Progress Note  11/01/2022 1:52 PM Adam [name]  MRN:  [id number] Subjective: Patient with schizophrenia.  No change condition.  No new behavior problems.  Stable.  No new physical problems. 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)   Stroke (HCC)   HTN (hypertension)   HLD (hyperlipidemia)   CAD (coronary artery disease)   Chronic systolic CHF (congestive heart failure) (HCC)   Anxiety   Infection of left earlobe  Total Time spent with patient: 30 minutes  Past Psychiatric History: Past history of schizophrenia and stroke  Past Medical History:  Past Medical History:  Diagnosis Date   Myocardial infarction (HCC)    Schizophrenia (HCC)    Stroke (HCC)     Past Surgical History:  Procedure Laterality Date   NO PAST SURGERIES     Family History:  Family History  Problem Relation Age of Onset   Hypertension Mother    Family Psychiatric  History: See previous Social History:  Social History   Substance and Sexual Activity  Alcohol Use Not Currently     Social History   Substance and Sexual Activity  Drug Use Not Currently    Social History   Socioeconomic History   Marital status: Single    Spouse name: Not on file   Number of children: Not on file   Years of education: Not on file   Highest education level: Not on file  Occupational History   Not on file  Tobacco Use   Smoking status: Never    Passive exposure: Never   Smokeless tobacco: Never  Vaping Use   Vaping Use: Unknown  Substance and Sexual Activity   Alcohol use: Not Currently   Drug use: Not Currently   Sexual activity: Not Currently  Other Topics Concern   Not on file  Social History Narrative   Not on file   Social Determinants of Health   Financial  Resource Strain: Not on file  Food Insecurity: Not on file  Transportation Needs: Not on file  Physical Activity: Not on file  Stress: Not on file  Social Connections: Not on file   Additional Social History:  Specify valuables returned:  (none)                      Sleep: Fair  Appetite:  Fair  Current Medications: Current Facility-Administered Medications  Medication Dose Route Frequency Provider Last Rate Last Admin   acetaminophen (TYLENOL) tablet 650 mg  650 mg Oral Q6H PRN Pahoua Schreiner T, MD   650 mg at 10/14/22 2328   alum & mag hydroxide-simeth (MAALOX/MYLANTA) 200-200-20 MG/5ML suspension 30 mL  30 mL Oral Q4H PRN Shalawn Wynder T, MD   30 mL at 04/26/22 4656   aspirin EC tablet 81 mg  81 mg Oral Daily Xan Sparkman, [name], MD   81 mg at 11/01/22 0917   atorvastatin (LIPITOR) tablet 20 mg  20 mg Oral Daily Yoni Lobos T, MD   20 mg at 11/01/22 0917   atropine 1 % ophthalmic solution 1 drop  1 drop Sublingual QID [name], MD   1 drop at 11/01/22 1204   benztropine (COGENTIN) tablet 0.5 mg  0.5 mg Oral BID Nakshatra Klose T, MD   0.5 mg at 11/01/22 0917   clonazePAM (KLONOPIN) tablet 1 mg  1 mg  Oral TID PRN He, [name], MD   1 mg at 10/14/22 1105   cloZAPine (CLOZARIL) tablet 300 mg  300 mg Oral QHS Tereso Unangst T, MD   300 mg at 10/31/22 2112   diphenhydrAMINE (BENADRYL) capsule 50 mg  50 mg Oral Q6H PRN Eloyce Bultman T, MD   50 mg at 09/30/22 2119   Or   diphenhydrAMINE (BENADRYL) injection 50 mg  50 mg Intramuscular Q6H PRN Angeline Trick T, MD       feeding supplement (ENSURE ENLIVE / ENSURE PLUS) liquid 237 mL  237 mL Oral BID BM Sheliah Fiorillo T, MD   237 mL at 11/01/22 1004   gabapentin (NEURONTIN) capsule 300 mg  300 mg Oral TID Janeth Terry T, MD   300 mg at 11/01/22 1856   haloperidol (HALDOL) tablet 2 mg  2 mg Oral TID Zoella Roberti, [name], MD   2 mg at 11/01/22 3149   haloperidol (HALDOL) tablet 5 mg  5 mg Oral Q6H PRN Romesha Scherer T, MD   5 mg at 09/17/22 1532    Or   haloperidol lactate (HALDOL) injection 5 mg  5 mg Intramuscular Q6H PRN Keara Pagliarulo T, MD       lisinopril (ZESTRIL) tablet 5 mg  5 mg Oral Daily Jewelia Bocchino T, MD   5 mg at 11/01/22 7026   magnesium hydroxide (MILK OF MAGNESIA) suspension 30 mL  30 mL Oral Daily PRN Maxamilian Amadon T, MD   30 mL at 09/15/22 1047   metoprolol succinate (TOPROL-XL) 24 hr tablet 25 mg  25 mg Oral Daily Antoinette Haskett T, MD   25 mg at 11/01/22 0917   ondansetron (ZOFRAN) tablet 4 mg  4 mg Oral Q8H PRN Lerry Liner M, NP   4 mg at 09/06/22 2320   paliperidone (INVEGA SUSTENNA) injection 156 mg  156 mg Intramuscular Q28 days Romaldo Saville T, MD   156 mg at 10/23/22 1715   paliperidone (INVEGA) 24 hr tablet 6 mg  6 mg Oral QHS Nelida Mandarino T, MD   6 mg at 10/31/22 2112   senna-docusate (Senokot-S) tablet 2 tablet  2 tablet Oral QHS PRN Sarina Ill, DO   2 tablet at 09/15/22 2110   temazepam (RESTORIL) capsule 15 mg  15 mg Oral QHS Jadzia Ibsen T, MD   15 mg at 10/31/22 2112   ziprasidone (GEODON) injection 20 mg  20 mg Intramuscular Q12H PRN Nastacia Raybuck, [name], MD   20 mg at 07/08/22 1453    Lab Results: No results found for this or any previous visit (from the past 48 hour(s)).  Blood Alcohol level:  Lab Results  Component Value Date   ETH <10 07/20/2021    Metabolic Disorder Labs: Lab Results  Component Value Date   HGBA1C 5.5 04/10/2022   MPG 111.15 04/10/2022   No results found for: "PROLACTIN" Lab Results  Component Value Date   CHOL 167 11/20/2021   TRIG 107 11/20/2021   HDL 26 (L) 11/20/2021   CHOLHDL 6.4 11/20/2021   VLDL 21 11/20/2021   LDLCALC 120 (H) 11/20/2021    Physical Findings: AIMS: Facial and Oral Movements Muscles of Facial Expression: None, normal Lips and Perioral Area: None, normal Jaw: None, normal Tongue: None, normal,Extremity Movements Upper (arms, wrists, hands, fingers): None, normal Lower (legs, knees, ankles, toes): None, normal, Trunk  Movements Neck, shoulders, hips: None, normal, Overall Severity Severity of abnormal movements (highest score from questions above): None, normal Incapacitation due to abnormal  movements: None, normal Patient's awareness of abnormal movements (rate only patient's report): No Awareness, Dental Status Current problems with teeth and/or dentures?: No Does patient usually wear dentures?: No  CIWA:    COWS:     Musculoskeletal: Strength & Muscle Tone: within normal limits Gait & Station: normal Patient leans: N/A  Psychiatric Specialty Exam:  Presentation  General Appearance:  Appropriate for Environment; Casual; Neat; Well Groomed  Eye Contact: Fair  Speech: Slow  Speech Volume: Decreased  Handedness: Left   Mood and Affect  Mood: Anxious  Affect: Congruent   Thought Process  Thought Processes: Goal Directed  Descriptions of Associations:Circumstantial  Orientation:Partial  Thought Content:Scattered  History of Schizophrenia/Schizoaffective disorder:Yes  Duration of Psychotic Symptoms:Greater than six months  Hallucinations:No data recorded Ideas of Reference:None  Suicidal Thoughts:No data recorded Homicidal Thoughts:No data recorded  Sensorium  Memory: Remote Good  Judgment: Fair  Insight: Fair   Art therapist  Concentration: Fair  Attention Span: Fair  Recall: [employer] of Knowledge: Fair  Language: Fair   Psychomotor Activity  Psychomotor Activity:No data recorded  Assets  Assets:No data recorded  Sleep  Sleep:No data recorded   Physical Exam: Physical Exam Vitals and nursing note reviewed.  Constitutional:      Appearance: Normal appearance.  HENT:     Head: Normocephalic and atraumatic.     Mouth/Throat:     Pharynx: Oropharynx is clear.  Eyes:     Pupils: Pupils are equal, round, and reactive to light.  Cardiovascular:     Rate and Rhythm: Normal rate and regular rhythm.  Pulmonary:     Effort:  Pulmonary effort is normal.     Breath sounds: Normal breath sounds.  Abdominal:     General: Abdomen is flat.     Palpations: Abdomen is soft.  Musculoskeletal:        General: Normal range of motion.  Skin:    General: Skin is warm and dry.  Neurological:     General: No focal deficit present.     Mental Status: He is alert. Mental status is at baseline.  Psychiatric:        Attention and Perception: He is inattentive.        Mood and Affect: Mood normal. Affect is blunt.        Speech: He is noncommunicative.    Review of Systems  Unable to perform ROS: Language   Blood pressure 103/73, pulse 88, temperature 98 F (36.7 C), temperature source Oral, resp. rate 18, height 5\' 8"  (1.727 m), weight 85.8 kg, SpO2 99 %. Body mass index is 28.76 kg/m.   Treatment Plan Summary: Medication management and Plan no change to medication management.  Encouragement and supportive counseling.  We continue to only be waiting for appropriate discharge  , MD 11/01/2022, 1:52 PM

## 2022-11-01 NOTE — [facility] Group Notes (Signed)
[facility] Group Notes:  (Nursing/MHT/Case Management/Adjunct)  Date:  11/01/2022  Time:  9:20 AM  Type of Therapy:   community meeting  Participation Level:  None  Participation Quality:   none  Affect:   sleepy  Cognitive:  Lacking  Insight:  None  Engagement in Group:  None  Modes of Intervention:  Discussion and Education  Summary of Progress/Problems:  Rodena Goldmann 11/01/2022, 9:20 AM

## 2022-11-01 NOTE — [facility] Counselor (Signed)
CSW spoke with Alcario Drought at [facility].    CSW faxed notes, FL2, face sheet, medications and labs to the facility.  [name], MSW, LCSW 11/01/2022 1:00 PM

## 2022-11-02 LAB — CBC WITH DIFFERENTIAL/PLATELET
Abs Immature Granulocytes: 0.01 10*3/uL (ref 0.00–0.07)
Basophils Absolute: 0.1 10*3/uL (ref 0.0–0.1)
Basophils Relative: 3 %
Eosinophils Absolute: 0.1 10*3/uL (ref 0.0–0.5)
Eosinophils Relative: 3 %
HCT: 37.7 % — ABNORMAL LOW (ref 39.0–52.0)
Hemoglobin: 13.5 g/dL (ref 13.0–17.0)
Immature Granulocytes: 0 %
Lymphocytes Relative: 21 %
Lymphs Abs: 0.9 10*3/uL (ref 0.7–4.0)
MCH: 31.7 pg (ref 26.0–34.0)
MCHC: 35.8 g/dL (ref 30.0–36.0)
MCV: 88.5 fL (ref 80.0–100.0)
Monocytes Absolute: 0.4 10*3/uL (ref 0.1–1.0)
Monocytes Relative: 9 %
Neutro Abs: 2.7 10*3/uL (ref 1.7–7.7)
Neutrophils Relative %: 64 %
Platelets: 173 10*3/uL (ref 150–400)
RBC: 4.26 MIL/uL (ref 4.22–5.81)
RDW: 12.9 % (ref 11.5–15.5)
WBC: 4.1 10*3/uL (ref 4.0–10.5)
nRBC: 0 % (ref 0.0–0.2)

## 2022-11-02 NOTE — Progress Notes (Signed)
Patient was cooperative with care on the shift and medications. He was requesting Ensure back on his chart, current writer will pass on his request. He seemed to rest well through out the night.

## 2022-11-02 NOTE — Group Note (Signed)
[facility] LCSW Group Therapy Note    Group Date: 11/02/2022 Start Time: 1314 End Time: 1420  Type of Therapy and Topic:  Group Therapy:  Overcoming Obstacles  Participation Level:  [facility] PARTICIPATION LEVEL: None   Description of Group:   In this group patients will be encouraged to explore what they see as obstacles to their own wellness and recovery. They will be guided to discuss their thoughts, feelings, and behaviors related to these obstacles. The group will process together ways to cope with barriers, with attention given to specific choices patients can make. Each patient will be challenged to identify changes they are motivated to make in order to overcome their obstacles. This group will be process-oriented, with patients participating in exploration of their own experiences as well as giving and receiving support and challenge from other group members.  Therapeutic Goals: 1. Patient will identify personal and current obstacles as they relate to admission. 2. Patient will identify barriers that currently interfere with their wellness or overcoming obstacles.  3. Patient will identify feelings, thought process and behaviors related to these barriers. 4. Patient will identify two changes they are willing to make to overcome these obstacles:    Summary of Patient Progress Patient was in and out of the group room during the discussion but did not participate. He would enter room, sit down for five seconds, and then leave again. Pt did this same pattern two or three times before leaving group for good and not returning.    Therapeutic Modalities:   Cognitive Behavioral Therapy Solution Focused Therapy Motivational Interviewing Relapse Prevention Therapy   Glenis Smoker, LCSW

## 2022-11-02 NOTE — Progress Notes (Signed)
Patient denies SI, HI, and AVH. He denies pain. Patient is cooperative and animated. No behavioral issues observed. Patient is compliant with scheduled medications. Patient remains safe on the unit at this time.

## 2022-11-02 NOTE — Progress Notes (Signed)
[facility] MD Progress Note  11/02/2022 3:06 PM Adam [name]  MRN:  [id number] Subjective: Patient seen and chart reviewed.  61 year old man with schizophrenia.  No changes in behavior no new complaints. 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)   Stroke (HCC)   HTN (hypertension)   HLD (hyperlipidemia)   CAD (coronary artery disease)   Chronic systolic CHF (congestive heart failure) (HCC)   Anxiety   Infection of left earlobe  Total Time spent with patient: 30 minutes  Past Psychiatric History: History of schizophrenia  Past Medical History:  Past Medical History:  Diagnosis Date   Myocardial infarction (HCC)    Schizophrenia (HCC)    Stroke (HCC)     Past Surgical History:  Procedure Laterality Date   NO PAST SURGERIES     Family History:  Family History  Problem Relation Age of Onset   Hypertension Mother    Family Psychiatric  History: See previous Social History:  Social History   Substance and Sexual Activity  Alcohol Use Not Currently     Social History   Substance and Sexual Activity  Drug Use Not Currently    Social History   Socioeconomic History   Marital status: Single    Spouse name: Not on file   Number of children: Not on file   Years of education: Not on file   Highest education level: Not on file  Occupational History   Not on file  Tobacco Use   Smoking status: Never    Passive exposure: Never   Smokeless tobacco: Never  Vaping Use   Vaping Use: Unknown  Substance and Sexual Activity   Alcohol use: Not Currently   Drug use: Not Currently   Sexual activity: Not Currently  Other Topics Concern   Not on file  Social History Narrative   Not on file   Social Determinants of Health   Financial Resource Strain: Not on  file  Food Insecurity: Not on file  Transportation Needs: Not on file  Physical Activity: Not on file  Stress: Not on file  Social Connections: Not on file   Additional Social History:  Specify valuables returned:  (none)                      Sleep: Fair  Appetite:  Fair  Current Medications: Current Facility-Administered Medications  Medication Dose Route Frequency Provider Last Rate Last Admin   acetaminophen (TYLENOL) tablet 650 mg  650 mg Oral Q6H PRN Rekisha Welling T, MD   650 mg at 10/14/22 2328   alum & mag hydroxide-simeth (MAALOX/MYLANTA) 200-200-20 MG/5ML suspension 30 mL  30 mL Oral Q4H PRN Lylie Blacklock T, MD   30 mL at 04/26/22 5366   aspirin EC tablet 81 mg  81 mg Oral Daily Lilia Letterman, [name], MD   81 mg at 11/02/22 0816   atorvastatin (LIPITOR) tablet 20 mg  20 mg Oral Daily Rune Mendez T, MD   20 mg at 11/02/22 0816   atropine 1 % ophthalmic solution 1 drop  1 drop Sublingual QID [name], MD   1 drop at 11/02/22 1156   benztropine (COGENTIN) tablet 0.5 mg  0.5 mg Oral BID Leviathan Macera T, MD   0.5 mg at 11/02/22 0816   clonazePAM (KLONOPIN) tablet 1 mg  1 mg Oral TID PRN He,  [name], MD   1 mg at 10/14/22 1105   cloZAPine (CLOZARIL) tablet 300 mg  300 mg Oral QHS Harris Penton T, MD   300 mg at 11/01/22 2100   diphenhydrAMINE (BENADRYL) capsule 50 mg  50 mg Oral Q6H PRN Adien Kimmel, [name], MD   50 mg at 09/30/22 2119   Or   diphenhydrAMINE (BENADRYL) injection 50 mg  50 mg Intramuscular Q6H PRN Harsh Trulock, [name], MD       feeding supplement (ENSURE ENLIVE / ENSURE PLUS) liquid 237 mL  237 mL Oral BID BM Mirha Brucato T, MD   237 mL at 11/02/22 1101   gabapentin (NEURONTIN) capsule 300 mg  300 mg Oral TID Mickala Laton, [name], MD   300 mg at 11/02/22 1442   haloperidol (HALDOL) tablet 2 mg  2 mg Oral TID Sunya Humbarger, [name], MD   2 mg at 11/02/22 1442   haloperidol (HALDOL) tablet 5 mg  5 mg Oral Q6H PRN Zyler Hyson, [name], MD   5 mg at 09/17/22 1532   Or   haloperidol  lactate (HALDOL) injection 5 mg  5 mg Intramuscular Q6H PRN Idris Edmundson, [name], MD       lisinopril (ZESTRIL) tablet 5 mg  5 mg Oral Daily Khadim Lundberg, [name], MD   5 mg at 11/02/22 0816   magnesium hydroxide (MILK OF MAGNESIA) suspension 30 mL  30 mL Oral Daily PRN Tyah Acord T, MD   30 mL at 09/15/22 1047   metoprolol succinate (TOPROL-XL) 24 hr tablet 25 mg  25 mg Oral Daily Atalaya Zappia T, MD   25 mg at 11/02/22 0816   ondansetron (ZOFRAN) tablet 4 mg  4 mg Oral Q8H PRN Jearld Lesch, NP   4 mg at 09/06/22 2320   paliperidone (INVEGA SUSTENNA) injection 156 mg  156 mg Intramuscular Q28 days Sharyah Bostwick, [name], MD   156 mg at 10/23/22 1715   paliperidone (INVEGA) 24 hr tablet 6 mg  6 mg Oral QHS Corbet Hanley T, MD   6 mg at 11/01/22 2100   senna-docusate (Senokot-S) tablet 2 tablet  2 tablet Oral QHS PRN Sarina Ill, DO   2 tablet at 09/15/22 2110   temazepam (RESTORIL) capsule 15 mg  15 mg Oral QHS Yurani Fettes T, MD   15 mg at 11/01/22 2059   ziprasidone (GEODON) injection 20 mg  20 mg Intramuscular Q12H PRN Erico Stan, [name], MD   20 mg at 07/08/22 1453    Lab Results:  Results for orders placed or performed during the hospital encounter of 03/19/22 (from the past 48 hour(s))  CBC with Differential/Platelet     Status: Abnormal   Collection Time: 11/02/22  1:17 PM  Result Value Ref Range   WBC 4.1 4.0 - 10.5 K/uL   RBC 4.26 4.22 - 5.81 MIL/uL   Hemoglobin 13.5 13.0 - 17.0 g/dL   HCT 00.3 (L) 49.1 - 79.1 %   MCV 88.5 80.0 - 100.0 fL   MCH 31.7 26.0 - 34.0 pg   MCHC 35.8 30.0 - 36.0 g/dL   RDW 50.5 69.7 - 94.8 %   Platelets 173 150 - 400 K/uL   nRBC 0.0 0.0 - 0.2 %   Neutrophils Relative % 64 %   Neutro Abs 2.7 1.7 - 7.7 K/uL   Lymphocytes Relative 21 %   Lymphs Abs 0.9 0.7 - 4.0 K/uL   Monocytes Relative 9 %   Monocytes Absolute 0.4 0.1 - 1.0 K/uL  Eosinophils Relative 3 %   Eosinophils Absolute 0.1 0.0 - 0.5 K/uL   Basophils Relative 3 %   Basophils Absolute 0.1  0.0 - 0.1 K/uL   Immature Granulocytes 0 %   Abs Immature Granulocytes 0.01 0.00 - 0.07 K/uL    Comment: Performed at [facility], [address] [place], [place] [address]    Blood Alcohol level:  Lab Results  Component Value Date   [facility] <10 07/20/2021    Metabolic Disorder Labs: Lab Results  Component Value Date   HGBA1C 5.5 04/10/2022   MPG 111.15 04/10/2022   No results found for: "PROLACTIN" Lab Results  Component Value Date   CHOL 167 11/20/2021   TRIG 107 11/20/2021   HDL 26 (L) 11/20/2021   CHOLHDL 6.4 11/20/2021   VLDL 21 11/20/2021   LDLCALC 120 (H) 11/20/2021    Physical Findings: AIMS: Facial and Oral Movements Muscles of Facial Expression: None, normal Lips and Perioral Area: None, normal Jaw: None, normal Tongue: None, normal,Extremity Movements Upper (arms, wrists, hands, fingers): None, normal Lower (legs, knees, ankles, toes): None, normal, Trunk Movements Neck, shoulders, hips: None, normal, Overall Severity Severity of abnormal movements (highest score from questions above): None, normal Incapacitation due to abnormal movements: None, normal Patient's awareness of abnormal movements (rate only patient's report): No Awareness, Dental Status Current problems with teeth and/or dentures?: No Does patient usually wear dentures?: No  CIWA:    COWS:     Musculoskeletal: Strength & Muscle Tone: within normal limits Gait & Station: normal Patient leans: N/A  Psychiatric Specialty Exam:  Presentation  General Appearance:  Appropriate for Environment; Casual; Neat; Well Groomed  Eye Contact: Fair  Speech: Slow  Speech Volume: Decreased  Handedness: Left   Mood and Affect  Mood: Anxious  Affect: Congruent   Thought Process  Thought Processes: Goal Directed  Descriptions of Associations:Circumstantial  Orientation:Partial  Thought Content:Scattered  History of Schizophrenia/Schizoaffective  disorder:Yes  Duration of Psychotic Symptoms:Greater than six months  Hallucinations:No data recorded Ideas of Reference:None  Suicidal Thoughts:No data recorded Homicidal Thoughts:No data recorded  Sensorium  Memory: Remote Good  Judgment: Fair  Insight: Fair   Art therapist  Concentration: Fair  Attention Span: Fair  Recall: [employer] of Knowledge: Fair  Language: Fair   Psychomotor Activity  Psychomotor Activity:No data recorded  Assets  Assets:No data recorded  Sleep  Sleep:No data recorded   Physical Exam: Physical Exam Vitals and nursing note reviewed.  Constitutional:      Appearance: Normal appearance.  HENT:     Head: Normocephalic and atraumatic.     Mouth/Throat:     Pharynx: Oropharynx is clear.  Eyes:     Pupils: Pupils are equal, round, and reactive to light.  Cardiovascular:     Rate and Rhythm: Normal rate and regular rhythm.  Pulmonary:     Effort: Pulmonary effort is normal.     Breath sounds: Normal breath sounds.  Abdominal:     General: Abdomen is flat.     Palpations: Abdomen is soft.  Musculoskeletal:        General: Normal range of motion.  Skin:    General: Skin is warm and dry.  Neurological:     General: No focal deficit present.     Mental Status: He is alert. Mental status is at baseline.  Psychiatric:        Attention and Perception: Attention normal.        Mood and Affect: Mood normal. Affect  is blunt.        Speech: He is noncommunicative.    ROS Blood pressure 116/67, pulse 94, temperature 98.7 F (37.1 C), temperature source Oral, resp. rate 20, height 5\' 8"  (1.727 m), weight 85.8 kg, SpO2 100 %. Body mass index is 28.76 kg/m.   Treatment Plan Summary: Medication management and Plan entirely waiting on discharge  Mordecai Rasmussen, MD 11/02/2022, 3:06 PM

## 2022-11-02 NOTE — [facility] Counselor (Addendum)
ADDENDUM CSW has sent the fax to [facility].  CSW has received an email confirming receipt.  [facility] reports that they will follow up on 11/05/2022.  [name], MSW, LCSW 11/02/2022 3:56 PM   CSW was informed that fax to [facility] [place] did NOT go through.  CSW has been asked to email the referral instead.    CSW to follow up.  [name], MSW, LCSW 11/02/2022 12:57 PM

## 2022-11-02 NOTE — Progress Notes (Signed)
Pharmacy - Clozapine     This patient's order has been reviewed for prescribing contraindications.   Clozapine REMS enrollment Verified: yes REMS number: [id number] Enrollment date: 01/04/2022 Current Outpatient Monitoring: every week  Dose Adjustments This Admission: Clozapine 300mg  qhs since 03/19/2022  Date ANC  7/26  2500 8/02   2200 8/21   2500 9/19   2451 10/3  3100 10/17 3800 11/01 3300 11/09 2700 11/17 3100 11/24 3100 12/01 2700  Plan: Lab submitted to clozapine REMS Continue ANC labs every week while inpatient   14/01, PharmD Clinical Pharmacist 11/02/2022 5:10 PM

## 2022-11-02 NOTE — Progress Notes (Addendum)
Recreation Therapy Notes    Date: 11/02/2022  Time: 10:50 am    Location: Craft room   Behavioral response: Appropriate   Intervention Topic:  Self-care   Discussion/Intervention:  Group content today was focused on Self-Care. The group defined self-care and some positive ways they care for themselves. Individuals expressed ways and reasons why they neglected any self-care in the past. Patients described ways to improve self-care in the future. The group explained what could happen if they did not do any self-care activities at all. The group participated in the intervention "self-care assessment" where they had a chance to discover some of their weaknesses and strengths in self- care. Patient came up with a self-care plan to improve themselves in the future.  Clinical Observations/Feedback: Patient came to group and was focused on what peers and staff had to say about self-care.Individual was social with peers and staff while participating in the intervention.        Rayanna Matusik LRT/CTRS         Isais Klipfel 11/02/2022 1:15 PM

## 2022-11-02 NOTE — Progress Notes (Signed)
Patient calm and cooperative during assessment denying SI/HI/AVH. Patient observed interacting appropriately with staff and peers on the unit. Patient refused his night time medications, pt given education, still refused. Pt being monitored Q 15 minutes for safety per unit protocol, remains safe on the unit

## 2022-11-03 NOTE — Plan of Care (Signed)
  Problem: Activity: Goal: Interest or engagement in leisure activities will improve Outcome: Progressing   Problem: Coping: Goal: Coping ability will improve Outcome: Progressing   Problem: Education: Goal: Will be free of psychotic symptoms Outcome: Progressing  Patient is compliant with treatment plan. Denies SI/HI/A/VH interacting well with Peers and Staff. Support and encouragement provided. 

## 2022-11-03 NOTE — Plan of Care (Signed)
Patient up for breakfast and compliant with medications. Patient asked for milk of magnesia for constipation. Patient pleasant and cooperative most of the shift. No outburst noted. Appetite and energy level good. Support and encouragement given.

## 2022-11-03 NOTE — [facility] Group Notes (Signed)
LCSW Group Therapy Note   11/03/2022 1:15pm   Type of Therapy and Topic:  Group Therapy:  Overcoming Obstacles   Participation Level:  None   Description of Group:    In this group patients will be encouraged to explore what they see as obstacles to their own wellness and recovery. They will be guided to discuss their thoughts, feelings, and behaviors related to these obstacles. The group will process together ways to cope with barriers, with attention given to specific choices patients can make. Each patient will be challenged to identify changes they are motivated to make in order to overcome their obstacles. This group will be process-oriented, with patients participating in exploration of their own experiences as well as giving and receiving support and challenge from other group members.   Therapeutic Goals: Patient will identify personal and current obstacles as they relate to admission. Patient will identify barriers that currently interfere with their wellness or overcoming obstacles.  Patient will identify feelings, thought process and behaviors related to these barriers. Patient will identify two changes they are willing to make to overcome these obstacles:      Summary of Patient Progress: pt was quiet for much of the group but did have several outbursts at various times where he would repeat one word loudly.  Less disruptive today than in the past.        Therapeutic Modalities:   Cognitive Behavioral Therapy Solution Focused Therapy Motivational Interviewing Relapse Prevention Therapy  Lorri Frederick, LCSW 11/03/2022 2:51 PM

## 2022-11-03 NOTE — Progress Notes (Signed)
[facility] MD Progress Note  11/03/2022 1:36 PM Adam [name]  MRN:  [id number] Subjective: Adam [name] seen on rounds.  No changes.  No complaints and no issues.  Nursing reports no problems. 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)   Stroke (HCC)   HTN (hypertension)   HLD (hyperlipidemia)   CAD (coronary artery disease)   Chronic systolic CHF (congestive heart failure) (HCC)   Anxiety   Infection of left earlobe  Total Time spent with patient: 15 minutes  Past Psychiatric History: CVA and schizophrenia  Past Medical History:  Past Medical History:  Diagnosis Date   Myocardial infarction (HCC)    Schizophrenia (HCC)    Stroke (HCC)     Past Surgical History:  Procedure Laterality Date   NO PAST SURGERIES     Family History:  Family History  Problem Relation Age of Onset   Hypertension Mother    Family Psychiatric  History: Unremarkable Social History:  Social History   Substance and Sexual Activity  Alcohol Use Not Currently     Social History   Substance and Sexual Activity  Drug Use Not Currently    Social History   Socioeconomic History   Marital status: Single    Spouse name: Not on file   Number of children: Not on file   Years of education: Not on file   Highest education level: Not on file  Occupational History   Not on file  Tobacco Use   Smoking status: Never    Passive exposure: Never   Smokeless tobacco: Never  Vaping Use   Vaping Use: Unknown  Substance and Sexual Activity   Alcohol use: Not Currently   Drug use: Not Currently   Sexual activity: Not Currently  Other Topics Concern   Not on file  Social History Narrative   Not on file   Social Determinants of Health   Financial Resource Strain: Not on file  Food  Insecurity: Not on file  Transportation Needs: Not on file  Physical Activity: Not on file  Stress: Not on file  Social Connections: Not on file   Additional Social History:  Specify valuables returned:  (none)                      Sleep: Good  Appetite:  Good  Current Medications: Current Facility-Administered Medications  Medication Dose Route Frequency Provider Last Rate Last Admin   acetaminophen (TYLENOL) tablet 650 mg  650 mg Oral Q6H PRN [name], [name], MD   650 mg at 10/14/22 2328   alum & mag hydroxide-simeth (MAALOX/MYLANTA) 200-200-20 MG/5ML suspension 30 mL  30 mL Oral Q4H PRN [name], [name], MD   30 mL at 04/26/22 6160   aspirin EC tablet 81 mg  81 mg Oral Daily [name], [name], MD   81 mg at 11/03/22 0801   atorvastatin (LIPITOR) tablet 20 mg  20 mg Oral Daily [name], [name], MD   20 mg at 11/03/22 0802   atropine 1 % ophthalmic solution 1 drop  1 drop Sublingual QID [name], MD   1 drop at 11/03/22 1237   benztropine (COGENTIN) tablet 0.5 mg  0.5 mg Oral BID [name], [name], MD   0.5 mg at 11/03/22 0802   clonazePAM (KLONOPIN) tablet 1 mg  1 mg Oral TID PRN He, [name],  MD   1 mg at 10/14/22 1105   cloZAPine (CLOZARIL) tablet 300 mg  300 mg Oral QHS [name], [name], MD   300 mg at 11/01/22 2100   diphenhydrAMINE (BENADRYL) capsule 50 mg  50 mg Oral Q6H PRN [name], [name], MD   50 mg at 09/30/22 2119   Or   diphenhydrAMINE (BENADRYL) injection 50 mg  50 mg Intramuscular Q6H PRN [name], [name], MD       feeding supplement (ENSURE ENLIVE / ENSURE PLUS) liquid 237 mL  237 mL Oral BID BM [name], [name], MD   237 mL at 11/03/22 0954   gabapentin (NEURONTIN) capsule 300 mg  300 mg Oral TID [name], [name], MD   300 mg at 11/03/22 4944   haloperidol (HALDOL) tablet 2 mg  2 mg Oral TID [name], [name], MD   2 mg at 11/03/22 9675   haloperidol (HALDOL) tablet 5 mg  5 mg Oral Q6H PRN [name], [name], MD   5 mg at 09/17/22 1532   Or   haloperidol lactate (HALDOL)  injection 5 mg  5 mg Intramuscular Q6H PRN [name], [name], MD       lisinopril (ZESTRIL) tablet 5 mg  5 mg Oral Daily [name], [name], MD   5 mg at 11/03/22 0857   magnesium hydroxide (MILK OF MAGNESIA) suspension 30 mL  30 mL Oral Daily PRN [name], [name], MD   30 mL at 11/03/22 0803   metoprolol succinate (TOPROL-XL) 24 hr tablet 25 mg  25 mg Oral Daily [name], [name], MD   25 mg at 11/03/22 0802   ondansetron (ZOFRAN) tablet 4 mg  4 mg Oral Q8H PRN Jearld Lesch, NP   4 mg at 09/06/22 2320   paliperidone (INVEGA SUSTENNA) injection 156 mg  156 mg Intramuscular Q28 days [name], [name], MD   156 mg at 10/23/22 1715   paliperidone (INVEGA) 24 hr tablet 6 mg  6 mg Oral QHS [name], [name], MD   6 mg at 11/01/22 2100   senna-docusate (Senokot-S) tablet 2 tablet  2 tablet Oral QHS PRN Sarina Ill, DO   2 tablet at 09/15/22 2110   temazepam (RESTORIL) capsule 15 mg  15 mg Oral QHS [name], [name], MD   15 mg at 11/01/22 2059   ziprasidone (GEODON) injection 20 mg  20 mg Intramuscular Q12H PRN [name], [name], MD   20 mg at 07/08/22 1453    Lab Results:  Results for orders placed or performed during the hospital encounter of 03/19/22 (from the past 48 hour(s))  CBC with Differential/Platelet     Status: Abnormal   Collection Time: 11/02/22  1:17 PM  Result Value Ref Range   WBC 4.1 4.0 - 10.5 K/uL   RBC 4.26 4.22 - 5.81 MIL/uL   Hemoglobin 13.5 13.0 - 17.0 g/dL   HCT 91.6 (L) 38.4 - 66.5 %   MCV 88.5 80.0 - 100.0 fL   MCH 31.7 26.0 - 34.0 pg   MCHC 35.8 30.0 - 36.0 g/dL   RDW 99.3 57.0 - 17.7 %   Platelets 173 150 - 400 K/uL   nRBC 0.0 0.0 - 0.2 %   Neutrophils Relative % 64 %   Neutro Abs 2.7 1.7 - 7.7 K/uL   Lymphocytes Relative 21 %   Lymphs Abs 0.9 0.7 - 4.0 K/uL   Monocytes Relative 9 %   Monocytes Absolute 0.4 0.1 - 1.0 K/uL  Eosinophils Relative 3 %   Eosinophils Absolute 0.1 0.0 - 0.5 K/uL   Basophils Relative 3 %   Basophils Absolute 0.1 0.0 - 0.1 K/uL    Immature Granulocytes 0 %   Abs Immature Granulocytes 0.01 0.00 - 0.07 K/uL    Comment: Performed at [facility], [address] [place], [place] [address]    Blood Alcohol level:  Lab Results  Component Value Date   [facility] <10 07/20/2021    Metabolic Disorder Labs: Lab Results  Component Value Date   HGBA1C 5.5 04/10/2022   MPG 111.15 04/10/2022   No results found for: "PROLACTIN" Lab Results  Component Value Date   CHOL 167 11/20/2021   TRIG 107 11/20/2021   HDL 26 (L) 11/20/2021   CHOLHDL 6.4 11/20/2021   VLDL 21 11/20/2021   LDLCALC 120 (H) 11/20/2021    Physical Findings: AIMS: Facial and Oral Movements Muscles of Facial Expression: None, normal Lips and Perioral Area: None, normal Jaw: None, normal Tongue: None, normal,Extremity Movements Upper (arms, wrists, hands, fingers): None, normal Lower (legs, knees, ankles, toes): None, normal, Trunk Movements Neck, shoulders, hips: None, normal, Overall Severity Severity of abnormal movements (highest score from questions above): None, normal Incapacitation due to abnormal movements: None, normal Patient's awareness of abnormal movements (rate only patient's report): No Awareness, Dental Status Current problems with teeth and/or dentures?: No Does patient usually wear dentures?: No  CIWA:    COWS:     Musculoskeletal: Strength & Muscle Tone: within normal limits Gait & Station: normal Patient leans: N/A  Psychiatric Specialty Exam:  Presentation  General Appearance:  Appropriate for Environment; Casual; Neat; Well Groomed  Eye Contact: Fair  Speech: Slow  Speech Volume: Decreased  Handedness: Left   Mood and Affect  Mood: Anxious  Affect: Congruent   Thought Process  Thought Processes: Goal Directed  Descriptions of Associations:Circumstantial  Orientation:Partial  Thought Content:Scattered  History of Schizophrenia/Schizoaffective disorder:Yes  Duration of Psychotic  Symptoms:Greater than six months  Hallucinations:No data recorded Ideas of Reference:None  Suicidal Thoughts:No data recorded Homicidal Thoughts:No data recorded  Sensorium  Memory: Remote Good  Judgment: Fair  Insight: Fair   Art therapist  Concentration: Fair  Attention Span: Fair  Recall: [employer] of Knowledge: Fair  Language: Fair   Psychomotor Activity  Psychomotor Activity:No data recorded  Assets  Assets:No data recorded  Sleep  Sleep:No data recorded   Physical Exam: Physical Exam Vitals and nursing note reviewed.  Constitutional:      Appearance: Normal appearance. He is normal weight.  Neurological:     General: No focal deficit present.     Mental Status: He is alert and oriented to person, place, and time.  Psychiatric:        Mood and Affect: Mood normal.        Behavior: Behavior normal.    Review of Systems  Constitutional: Negative.   HENT: Negative.    Eyes: Negative.   Respiratory: Negative.    Cardiovascular: Negative.   Gastrointestinal: Negative.   Genitourinary: Negative.   Musculoskeletal: Negative.   Skin: Negative.   Neurological: Negative.   Endo/Heme/Allergies: Negative.   Psychiatric/Behavioral: Negative.     Blood pressure 120/88, pulse 89, temperature 98.5 F (36.9 C), temperature source Oral, resp. rate 20, height 5\' 8"  (1.727 m), weight 85.8 kg, SpO2 98 %. Body mass index is 28.76 kg/m.   Treatment Plan Summary: Daily contact with patient to assess and evaluate symptoms and progress in treatment, Medication management,  and Plan continue current medications.  Sarina Ill, DO 11/03/2022, 1:36 PM

## 2022-11-04 NOTE — Plan of Care (Signed)
  Problem: Activity: Goal: Interest or engagement in leisure activities will improve Outcome: Progressing   Problem: Coping: Goal: Coping ability will improve Outcome: Progressing   Problem: Education: Goal: Will be free of psychotic symptoms Outcome: Progressing  Patient is compliant with treatment plan. Denies SI/HI/A/VH interacting well with Games developer. Support and encouragement provided.

## 2022-11-04 NOTE — Progress Notes (Signed)
[facility] MD Progress Note  11/04/2022 1:45 PM Pryce Fly  MRN:  [id number] Subjective: Adam [name] seen on rounds.  No changes.  No complaints.  No issues.  Tolerating medications without any problems. 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[place])   Stroke (HCC)   HTN (hypertension)   HLD (hyperlipidemia)   CAD (coronary artery disease)   Chronic systolic CHF (congestive heart failure) (HCC)   Anxiety   Infection of left earlobe  Total Time spent with patient: 15 minutes  Past Psychiatric History: CVA and schizophrenia  Past Medical History:  Past Medical History:  Diagnosis Date   Myocardial infarction ([place])    Schizophrenia ([place])    Stroke ([place])     Past Surgical History:  Procedure Laterality Date   NO PAST SURGERIES     Family History:  Family History  Problem Relation Age of Onset   Hypertension Mother     Social History:  Social History   Substance and Sexual Activity  Alcohol Use Not Currently     Social History   Substance and Sexual Activity  Drug Use Not Currently    Social History   Socioeconomic History   Marital status: Single    Spouse name: Not on file   Number of children: Not on file   Years of education: Not on file   Highest education level: Not on file  Occupational History   Not on file  Tobacco Use   Smoking status: Never    Passive exposure: Never   Smokeless tobacco: Never  Vaping Use   Vaping Use: Unknown  Substance and Sexual Activity   Alcohol use: Not Currently   Drug use: Not Currently   Sexual activity: Not Currently  Other Topics Concern   Not on file  Social History Narrative   Not on file   Social Determinants of Health   Financial Resource Strain: Not on file  Food Insecurity: Not on file   Transportation Needs: Not on file  Physical Activity: Not on file  Stress: Not on file  Social Connections: Not on file   Additional Social History:  Specify valuables returned:  (none)                      Sleep: Good  Appetite:  Good  Current Medications: Current Facility-Administered Medications  Medication Dose Route Frequency Provider Last Rate Last Admin   acetaminophen (TYLENOL) tablet 650 mg  650 mg Oral Q6H PRN [name], [name], MD   650 mg at 10/14/22 2328   alum & mag hydroxide-simeth (MAALOX/MYLANTA) 200-200-20 MG/5ML suspension 30 mL  30 mL Oral Q4H PRN [name], [name], MD   30 mL at 04/26/22 [id number]   aspirin EC tablet 81 mg  81 mg Oral Daily [name], [name], MD   81 mg at 11/04/22 0857   atorvastatin (LIPITOR) tablet 20 mg  20 mg Oral Daily [name], [name], MD   20 mg at 11/04/22 0857   atropine 1 % ophthalmic solution 1 drop  1 drop Sublingual QID [name], MD   1 drop at 11/04/22 1244   benztropine (COGENTIN) tablet 0.5 mg  0.5 mg Oral BID [name], [name], MD   0.5 mg at 11/04/22 0857   clonazePAM (KLONOPIN) tablet 1 mg  1 mg Oral TID PRN He, [name], MD  1 mg at 10/14/22 1105   cloZAPine (CLOZARIL) tablet 300 mg  300 mg Oral QHS [name], [name], MD   300 mg at 11/03/22 2121   diphenhydrAMINE (BENADRYL) capsule 50 mg  50 mg Oral Q6H PRN [name], [name], MD   50 mg at 09/30/22 2119   Or   diphenhydrAMINE (BENADRYL) injection 50 mg  50 mg Intramuscular Q6H PRN [name], [name], MD       feeding supplement (ENSURE ENLIVE / ENSURE PLUS) liquid 237 mL  237 mL Oral BID BM [name], [name], MD   237 mL at 11/03/22 1411   gabapentin (NEURONTIN) capsule 300 mg  300 mg Oral TID [name], [name], MD   300 mg at 11/04/22 1040   haloperidol (HALDOL) tablet 2 mg  2 mg Oral TID [name], [name], MD   2 mg at 11/04/22 1040   haloperidol (HALDOL) tablet 5 mg  5 mg Oral Q6H PRN [name], [name], MD   5 mg at 09/17/22 1532   Or   haloperidol lactate (HALDOL) injection 5 mg  5 mg  Intramuscular Q6H PRN [name], [name], MD       lisinopril (ZESTRIL) tablet 5 mg  5 mg Oral Daily [name], [name], MD   5 mg at 11/04/22 0857   magnesium hydroxide (MILK OF MAGNESIA) suspension 30 mL  30 mL Oral Daily PRN [name], [name], MD   30 mL at 11/03/22 0803   metoprolol succinate (TOPROL-XL) 24 hr tablet 25 mg  25 mg Oral Daily [name], [name], MD   25 mg at 11/04/22 0857   ondansetron (ZOFRAN) tablet 4 mg  4 mg Oral Q8H PRN [name], NP   4 mg at 09/06/22 2320   paliperidone (INVEGA SUSTENNA) injection 156 mg  156 mg Intramuscular Q28 days [name], [name], MD   156 mg at 10/23/22 1715   paliperidone (INVEGA) 24 hr tablet 6 mg  6 mg Oral QHS [name], [name], MD   6 mg at 11/03/22 2122   senna-docusate (Senokot-S) tablet 2 tablet  2 tablet Oral QHS PRN [name], DO   2 tablet at 09/15/22 2110   temazepam (RESTORIL) capsule 15 mg  15 mg Oral QHS [name], [name], MD   15 mg at 11/03/22 2120   ziprasidone (GEODON) injection 20 mg  20 mg Intramuscular Q12H PRN [name], [name], MD   20 mg at 07/08/22 1453    Lab Results: No results found for this or any previous visit (from the past 48 hour(s)).  Blood Alcohol level:  Lab Results  Component Value Date   ETH <10 [id number]    Metabolic Disorder Labs: Lab Results  Component Value Date   HGBA1C 5.5 04/10/2022   MPG 111.15 04/10/2022   No results found for: "PROLACTIN" Lab Results  Component Value Date   CHOL 167 11/20/2021   TRIG 107 11/20/2021   HDL 26 (L) 11/20/2021   CHOLHDL 6.4 11/20/2021   VLDL 21 11/20/2021   LDLCALC 120 (H) 11/20/2021    Physical Findings: AIMS: Facial and Oral Movements Muscles of Facial Expression: None, normal Lips and Perioral Area: None, normal Jaw: None, normal Tongue: None, normal,Extremity Movements Upper (arms, wrists, hands, fingers): None, normal Lower (legs, knees, ankles, toes): None, normal, Trunk Movements Neck, shoulders, hips: None, normal, Overall  Severity Severity of abnormal movements (highest score from questions above): None, normal Incapacitation due to abnormal movements: None, normal Patient's awareness of abnormal movements (  rate only patient's report): No Awareness, Dental Status Current problems with teeth and/or dentures?: No Does patient usually wear dentures?: No  CIWA:    COWS:     Musculoskeletal: Strength & Muscle Tone: within normal limits Gait & Station: normal Patient leans: N/A  Psychiatric Specialty Exam:  Presentation  General Appearance:  Appropriate for Environment; Casual; Neat; Well Groomed  Eye Contact: Fair  Speech: Slow  Speech Volume: Decreased  Handedness: Left   Mood and Affect  Mood: Anxious  Affect: Congruent   Thought Process  Thought Processes: Goal Directed  Descriptions of Associations:Circumstantial  Orientation:Partial  Thought Content:Scattered  History of Schizophrenia/Schizoaffective disorder:Yes  Duration of Psychotic Symptoms:Greater than six months  Hallucinations:No data recorded Ideas of Reference:None  Suicidal Thoughts:No data recorded Homicidal Thoughts:No data recorded  Sensorium  Memory: Remote Good  Judgment: Fair  Insight: Fair   Chartered certified accountant: Fair  Attention Span: Fair  Recall: Fair  Fund of Knowledge: Fair  Language: Fair   Psychomotor Activity  Psychomotor Activity:No data recorded  Assets  Assets:No data recorded  Sleep  Sleep:No data recorded    Blood pressure 99/79, pulse 81, temperature 97.6 F (36.4 C), temperature source Oral, resp. rate 20, height 5\' 8"  (1.727 m), weight 85.8 kg, SpO2 98 %. Body mass index is 28.76 kg/m.   Treatment Plan Summary: Daily contact with patient to assess and evaluate symptoms and progress in treatment, Medication management, and Plan continue current medications.  , DO 11/04/2022, 1:45 PM

## 2022-11-05 NOTE — Progress Notes (Signed)
Condition unchanged, patient remains at base line no distress noted interacting appropriately with peers and staff, and denies SI/HI/AVH. 15 minutes safety checks maintained.

## 2022-11-05 NOTE — Progress Notes (Signed)
Recreation Therapy Notes   Date: 11/05/2022  Time: 10:40 am     Location: Craft room   Behavioral response: N/A   Intervention Topic: Goals   Discussion/Intervention: Patient did not attend group.   Clinical Observations/Feedback:  Patient did not attend group.    Laurin Paulo LRT/CTRS        Adam [name] 11/05/2022 12:17 PM

## 2022-11-05 NOTE — Progress Notes (Signed)
D- Patient alert and oriented x 3. Affect animated/mood euthymic. Denies SI/HI/AVH. He denies pain. Ambulating in halls- gait steady. A- Scheduled medications administered to patient, per MD orders. Support and encouragement provided.  Routine safety checks conducted every 15 minutes without incident.  Patient informed to notify staff with problems or concerns. R- No adverse drug reactions noted. Patient compliant with medications and treatment plan. Patient receptive, calm, and cooperative. He interacts well with others on the unit.  Patient contracts for safety and remains safe on the unit at this time.

## 2022-11-05 NOTE — Group Note (Signed)
[facility] LCSW Group Therapy Note    Group Date: 11/05/2022 Start Time: 1300 End Time: 1400  Type of Therapy and Topic:  Group Therapy:  Overcoming Obstacles  Participation Level:  [facility] PARTICIPATION LEVEL: None  Mood:  Description of Group:   In this group patients will be encouraged to explore what they see as obstacles to their own wellness and recovery. They will be guided to discuss their thoughts, feelings, and behaviors related to these obstacles. The group will process together ways to cope with barriers, with attention given to specific choices patients can make. Each patient will be challenged to identify changes they are motivated to make in order to overcome their obstacles. This group will be process-oriented, with patients participating in exploration of their own experiences as well as giving and receiving support and challenge from other group members.  Therapeutic Goals: 1. Patient will identify personal and current obstacles as they relate to admission. 2. Patient will identify barriers that currently interfere with their wellness or overcoming obstacles.  3. Patient will identify feelings, thought process and behaviors related to these barriers. 4. Patient will identify two changes they are willing to make to overcome these obstacles:    Summary of Patient Progress   Patient sat for the beginning of group, however left shortly after the start.   Therapeutic Modalities:   Cognitive Behavioral Therapy Solution Focused Therapy Motivational Interviewing Relapse Prevention Therapy   Harden Mo, LCSW

## 2022-11-05 NOTE — Plan of Care (Signed)
Pt is calm and cooperative, pleasant affect; compliant with medications.  Denies pain, SI, HI, AVH.  Continued to monitor with q 15 minute observations.

## 2022-11-05 NOTE — Progress Notes (Signed)
[facility] MD Progress Note  11/05/2022 3:22 PM Shaan Gayhart  MRN:  [id number] Subjective: Patient with schizophrenia.  No overall change in behavior pattern or complaints.  This afternoon he has been vocalizing a lot but not aggressive. 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[place])   Stroke (HCC)   HTN (hypertension)   HLD (hyperlipidemia)   CAD (coronary artery disease)   Chronic systolic CHF (congestive heart failure) (HCC)   Anxiety   Infection of left earlobe  Total Time spent with patient: 30 minutes  Past Psychiatric History: Past history of schizophrenia  Past Medical History:  Past Medical History:  Diagnosis Date   Myocardial infarction ([place])    Schizophrenia ([place])    Stroke ([place])     Past Surgical History:  Procedure Laterality Date   NO PAST SURGERIES     Family History:  Family History  Problem Relation Age of Onset   Hypertension Mother    Family Psychiatric  History: See previous Social History:  Social History   Substance and Sexual Activity  Alcohol Use Not Currently     Social History   Substance and Sexual Activity  Drug Use Not Currently    Social History   Socioeconomic History   Marital status: Single    Spouse name: Not on file   Number of children: Not on file   Years of education: Not on file   Highest education level: Not on file  Occupational History   Not on file  Tobacco Use   Smoking status: Never    Passive exposure: Never   Smokeless tobacco: Never  Vaping Use   Vaping Use: Unknown  Substance and Sexual Activity   Alcohol use: Not Currently   Drug use: Not Currently   Sexual activity: Not Currently  Other Topics Concern   Not on file  Social History Narrative   Not on file   Social Determinants of  Health   Financial Resource Strain: Not on file  Food Insecurity: Not on file  Transportation Needs: Not on file  Physical Activity: Not on file  Stress: Not on file  Social Connections: Not on file   Additional Social History:  Specify valuables returned:  (none)                      Sleep: Fair  Appetite:  Fair  Current Medications: Current Facility-Administered Medications  Medication Dose Route Frequency Provider Last Rate Last Admin   acetaminophen (TYLENOL) tablet 650 mg  650 mg Oral Q6H PRN Landen Knoedler T, MD   650 mg at 10/14/22 2328   alum & mag hydroxide-simeth (MAALOX/MYLANTA) 200-200-20 MG/5ML suspension 30 mL  30 mL Oral Q4H PRN Josselyne Onofrio T, MD   30 mL at 04/26/22 [id number]   aspirin EC tablet 81 mg  81 mg Oral Daily Nyeem Stoke T, MD   81 mg at 11/05/22 1012   atorvastatin (LIPITOR) tablet 20 mg  20 mg Oral Daily Kaneshia Cater T, MD   20 mg at 11/05/22 1012   atropine 1 % ophthalmic solution 1 drop  1 drop Sublingual QID [name], MD   1 drop at 11/05/22 1205   benztropine (COGENTIN) tablet 0.5 mg  0.5 mg Oral BID Gordan Grell T, MD   0.5 mg at 11/05/22 1013   clonazePAM (KLONOPIN) tablet 1 mg  1 mg Oral TID PRN He, [name], MD   1 mg at 10/14/22 1105   cloZAPine (CLOZARIL) tablet 300 mg  300 mg Oral QHS Laquon Emel T, MD   300 mg at 11/04/22 2209   diphenhydrAMINE (BENADRYL) capsule 50 mg  50 mg Oral Q6H PRN Juanmiguel Defelice T, MD   50 mg at 09/30/22 2119   Or   diphenhydrAMINE (BENADRYL) injection 50 mg  50 mg Intramuscular Q6H PRN Annelle Behrendt T, MD       feeding supplement (ENSURE ENLIVE / ENSURE PLUS) liquid 237 mL  237 mL Oral BID BM Azaela Caracci T, MD   237 mL at 11/05/22 1405   gabapentin (NEURONTIN) capsule 300 mg  300 mg Oral TID Ellieanna Funderburg T, MD   300 mg at 11/05/22 1512   haloperidol (HALDOL) tablet 2 mg  2 mg Oral TID Seymone Forlenza T, MD   2 mg at 11/05/22 1512   haloperidol (HALDOL) tablet 5 mg  5 mg Oral Q6H PRN Shariyah Eland T, MD   5 mg  at 09/17/22 1532   Or   haloperidol lactate (HALDOL) injection 5 mg  5 mg Intramuscular Q6H PRN Ronne Savoia T, MD       lisinopril (ZESTRIL) tablet 5 mg  5 mg Oral Daily Kerrick Miler T, MD   5 mg at 11/05/22 1012   magnesium hydroxide (MILK OF MAGNESIA) suspension 30 mL  30 mL Oral Daily PRN Vannessa Godown T, MD   30 mL at 11/03/22 0803   metoprolol succinate (TOPROL-XL) 24 hr tablet 25 mg  25 mg Oral Daily Mickel Schreur T, MD   25 mg at 11/05/22 1012   ondansetron (ZOFRAN) tablet 4 mg  4 mg Oral Q8H PRN [name], NP   4 mg at 09/06/22 2320   paliperidone (INVEGA SUSTENNA) injection 156 mg  156 mg Intramuscular Q28 days Ronie Fleeger T, MD   156 mg at 10/23/22 1715   paliperidone (INVEGA) 24 hr tablet 6 mg  6 mg Oral QHS Vedanth Sirico T, MD   6 mg at 11/04/22 2209   senna-docusate (Senokot-S) tablet 2 tablet  2 tablet Oral QHS PRN [name], DO   2 tablet at 09/15/22 2110   temazepam (RESTORIL) capsule 15 mg  15 mg Oral QHS Geovanna Simko T, MD   15 mg at 11/04/22 2209   ziprasidone (GEODON) injection 20 mg  20 mg Intramuscular Q12H PRN Sade Hollon, [name], MD   20 mg at 07/08/22 1453    Lab Results: No results found for this or any previous visit (from the past 40 hour(s)).  Blood Alcohol level:  Lab Results  Component Value Date   ETH <10 [id number]    Metabolic Disorder Labs: Lab Results  Component Value Date   HGBA1C 5.5 04/10/2022   MPG 111.15 04/10/2022   No results found for: "PROLACTIN" Lab Results  Component Value Date   CHOL 167 11/20/2021   TRIG 107 11/20/2021   HDL 26 (L) 11/20/2021   CHOLHDL 6.4 11/20/2021   VLDL 21 11/20/2021   LDLCALC 120 (H) 11/20/2021    Physical Findings: AIMS: Facial and Oral Movements Muscles of Facial Expression: None, normal Lips and Perioral Area: None, normal Jaw: None, normal Tongue: None, normal,Extremity Movements Upper (arms, wrists, hands, fingers): None, normal Lower (legs, knees, ankles, toes): None,  normal, Trunk Movements Neck, shoulders, hips: None, normal, Overall Severity Severity of abnormal movements (highest score from questions above): None, normal Incapacitation due  to abnormal movements: None, normal Patient's awareness of abnormal movements (rate only patient's report): No Awareness, Dental Status Current problems with teeth and/or dentures?: No Does patient usually wear dentures?: No  CIWA:    COWS:     Musculoskeletal: Strength & Muscle Tone: within normal limits Gait & Station: normal Patient leans: N/A  Psychiatric Specialty Exam:  Presentation  General Appearance:  Appropriate for Environment; Casual; Neat; Well Groomed  Eye Contact: Fair  Speech: Slow  Speech Volume: Decreased  Handedness: Left   Mood and Affect  Mood: Anxious  Affect: Congruent   Thought Process  Thought Processes: Goal Directed  Descriptions of Associations:Circumstantial  Orientation:Partial  Thought Content:Scattered  History of Schizophrenia/Schizoaffective disorder:Yes  Duration of Psychotic Symptoms:Greater than six months  Hallucinations:No data recorded Ideas of Reference:None  Suicidal Thoughts:No data recorded Homicidal Thoughts:No data recorded  Sensorium  Memory: Remote Good  Judgment: Fair  Insight: Fair   Art therapist  Concentration: Fair  Attention Span: Fair  Recall: [employer] of Knowledge: Fair  Language: Fair   Psychomotor Activity  Psychomotor Activity:No data recorded  Assets  Assets:No data recorded  Sleep  Sleep:No data recorded   Physical Exam: Physical Exam Vitals and nursing note reviewed.  Constitutional:      Appearance: Normal appearance.  HENT:     Head: Normocephalic and atraumatic.     Mouth/Throat:     Pharynx: Oropharynx is clear.  Eyes:     Pupils: Pupils are equal, round, and reactive to light.  Cardiovascular:     Rate and Rhythm: Normal rate and regular rhythm.   Pulmonary:     Effort: Pulmonary effort is normal.     Breath sounds: Normal breath sounds.  Abdominal:     General: Abdomen is flat.     Palpations: Abdomen is soft.  Musculoskeletal:        General: Normal range of motion.  Skin:    General: Skin is warm and dry.  Neurological:     General: No focal deficit present.     Mental Status: He is alert. Mental status is at baseline.  Psychiatric:        Attention and Perception: He is inattentive.        Mood and Affect: Affect is blunt.        Speech: He is noncommunicative.    Review of Systems  Unable to perform ROS: Language   Blood pressure 115/87, pulse 89, temperature 98.6 F (37 C), temperature source Oral, resp. rate 20, height 5\' 8"  (1.727 m), weight 85.8 kg, SpO2 98 %. Body mass index is 28.76 kg/m.   Treatment Plan Summary: Plan no change to medication management.  Supportive counseling.  Patient is only awaiting appropriate discharge plan  , MD 11/05/2022, 3:22 PM

## 2022-11-06 NOTE — Progress Notes (Signed)
Patient denies SI, HI, and AVH. He denies pain or other physical concerns. Patient is compliant with scheduled medications. Patient remains safe on the unit at this time.

## 2022-11-06 NOTE — [facility] Counselor (Signed)
CSW received call from patient's mother this morning. Mother requested that CSW follow up with group home.  She reports that the group home owner caller her this morning and stated that she is waiting on this writer to return call with the correct "codes" for the form.  CSW explained that CSW has not been contacted by group home and CSW is not the individual to contact about the form.  CSW directed mother to have the group home call [facility] with concerns.  CSW contacted group home and spoke with Ms. Annice Pih and explained the above.  CSW also attempted to contact [facility], however, had to leave a HIPAA compliant voicemail.  Miss Annice Pih reports that bed and home remain available to the patient, however, the process needs to "hurry up" as she is currently reviewing others for potentially taking the bed in question.  [name], MSW, LCSW 11/06/2022 11:47 AM

## 2022-11-06 NOTE — Progress Notes (Signed)
[facility] MD Progress Note  11/06/2022 12:48 PM Adam [name]  MRN:  [id number] Subjective: Patient seen and chart reviewed.  Reviewed patient's situation at morning report.  No change to behavior no change to presentation.  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)   Stroke (HCC)   HTN (hypertension)   HLD (hyperlipidemia)   CAD (coronary artery disease)   Chronic systolic CHF (congestive heart failure) (HCC)   Anxiety   Infection of left earlobe  Total Time spent with patient: 30 minutes  Past Psychiatric History: Past history of schizophrenia  Past Medical History:  Past Medical History:  Diagnosis Date   Myocardial infarction (HCC)    Schizophrenia (HCC)    Stroke (HCC)     Past Surgical History:  Procedure Laterality Date   NO PAST SURGERIES     Family History:  Family History  Problem Relation Age of Onset   Hypertension Mother    Family Psychiatric  History: See previous Social History:  Social History   Substance and Sexual Activity  Alcohol Use Not Currently     Social History   Substance and Sexual Activity  Drug Use Not Currently    Social History   Socioeconomic History   Marital status: Single    Spouse name: Not on file   Number of children: Not on file   Years of education: Not on file   Highest education level: Not on file  Occupational History   Not on file  Tobacco Use   Smoking status: Never    Passive exposure: Never   Smokeless tobacco: Never  Vaping Use   Vaping Use: Unknown  Substance and Sexual Activity   Alcohol use: Not Currently   Drug use: Not Currently   Sexual activity: Not Currently  Other Topics Concern   Not on file  Social History Narrative   Not on file   Social  Determinants of Health   Financial Resource Strain: Not on file  Food Insecurity: Not on file  Transportation Needs: Not on file  Physical Activity: Not on file  Stress: Not on file  Social Connections: Not on file   Additional Social History:  Specify valuables returned:  (none)                      Sleep: Fair  Appetite:  Fair  Current Medications: Current Facility-Administered Medications  Medication Dose Route Frequency Provider Last Rate Last Admin   acetaminophen (TYLENOL) tablet 650 mg  650 mg Oral Q6H PRN Khyree Carillo T, MD   650 mg at 10/14/22 2328   alum & mag hydroxide-simeth (MAALOX/MYLANTA) 200-200-20 MG/5ML suspension 30 mL  30 mL Oral Q4H PRN Kylor Valverde T, MD   30 mL at 04/26/22 9562   aspirin EC tablet 81 mg  81 mg Oral Daily Merelin Human T, MD   81 mg at 11/06/22 0820   atorvastatin (LIPITOR) tablet 20 mg  20 mg Oral Daily Arna Luis T, MD   20 mg at 11/06/22 0820   atropine 1 % ophthalmic solution 1 drop  1 drop Sublingual QID [name], MD   1 drop at 11/06/22 1156   benztropine (COGENTIN) tablet 0.5 mg  0.5 mg Oral BID Chung Chagoya T, MD   0.5 mg at 11/06/22 0820   clonazePAM (KLONOPIN)  tablet 1 mg  1 mg Oral TID PRN He, [name], MD   1 mg at 10/14/22 1105   cloZAPine (CLOZARIL) tablet 300 mg  300 mg Oral QHS Shamia Uppal T, MD   300 mg at 11/05/22 2216   diphenhydrAMINE (BENADRYL) capsule 50 mg  50 mg Oral Q6H PRN Talina Pleitez T, MD   50 mg at 09/30/22 2119   Or   diphenhydrAMINE (BENADRYL) injection 50 mg  50 mg Intramuscular Q6H PRN Jacarie Pate T, MD       feeding supplement (ENSURE ENLIVE / ENSURE PLUS) liquid 237 mL  237 mL Oral BID BM Laray Rivkin T, MD   237 mL at 11/05/22 1405   gabapentin (NEURONTIN) capsule 300 mg  300 mg Oral TID Taeko Schaffer T, MD   300 mg at 11/06/22 3875   haloperidol (HALDOL) tablet 2 mg  2 mg Oral TID Courtnei Ruddell, [name], MD   2 mg at 11/06/22 6433   haloperidol (HALDOL) tablet 5 mg  5 mg Oral Q6H PRN Haily Caley,  Yannet Rincon T, MD   5 mg at 09/17/22 1532   Or   haloperidol lactate (HALDOL) injection 5 mg  5 mg Intramuscular Q6H PRN Ikhlas Albo T, MD       lisinopril (ZESTRIL) tablet 5 mg  5 mg Oral Daily Suanne Minahan T, MD   5 mg at 11/06/22 0820   magnesium hydroxide (MILK OF MAGNESIA) suspension 30 mL  30 mL Oral Daily PRN Kaelin Bonelli T, MD   30 mL at 11/03/22 0803   metoprolol succinate (TOPROL-XL) 24 hr tablet 25 mg  25 mg Oral Daily Candace Begue T, MD   25 mg at 11/06/22 0820   ondansetron (ZOFRAN) tablet 4 mg  4 mg Oral Q8H PRN Lerry Liner M, NP   4 mg at 09/06/22 2320   paliperidone (INVEGA SUSTENNA) injection 156 mg  156 mg Intramuscular Q28 days Bronwen Pendergraft T, MD   156 mg at 10/23/22 1715   paliperidone (INVEGA) 24 hr tablet 6 mg  6 mg Oral QHS Jervis Trapani T, MD   6 mg at 11/05/22 2215   senna-docusate (Senokot-S) tablet 2 tablet  2 tablet Oral QHS PRN Sarina Ill, DO   2 tablet at 09/15/22 2110   temazepam (RESTORIL) capsule 15 mg  15 mg Oral QHS Jayd Forrey T, MD   15 mg at 11/05/22 2215   ziprasidone (GEODON) injection 20 mg  20 mg Intramuscular Q12H PRN Zelene Barga, [name], MD   20 mg at 07/08/22 1453    Lab Results: No results found for this or any previous visit (from the past 48 hour(s)).  Blood Alcohol level:  Lab Results  Component Value Date   ETH <10 07/20/2021    Metabolic Disorder Labs: Lab Results  Component Value Date   HGBA1C 5.5 04/10/2022   MPG 111.15 04/10/2022   No results found for: "PROLACTIN" Lab Results  Component Value Date   CHOL 167 11/20/2021   TRIG 107 11/20/2021   HDL 26 (L) 11/20/2021   CHOLHDL 6.4 11/20/2021   VLDL 21 11/20/2021   LDLCALC 120 (H) 11/20/2021    Physical Findings: AIMS: Facial and Oral Movements Muscles of Facial Expression: None, normal Lips and Perioral Area: None, normal Jaw: None, normal Tongue: None, normal,Extremity Movements Upper (arms, wrists, hands, fingers): None, normal Lower (legs, knees, ankles,  toes): None, normal, Trunk Movements Neck, shoulders, hips: None, normal, Overall Severity Severity of abnormal movements (highest score from questions above):  None, normal Incapacitation due to abnormal movements: None, normal Patient's awareness of abnormal movements (rate only patient's report): No Awareness, Dental Status Current problems with teeth and/or dentures?: No Does patient usually wear dentures?: No  CIWA:    COWS:     Musculoskeletal: Strength & Muscle Tone: within normal limits Gait & Station: normal Patient leans: N/A  Psychiatric Specialty Exam:  Presentation  General Appearance:  Appropriate for Environment; Casual; Neat; Well Groomed  Eye Contact: Fair  Speech: Slow  Speech Volume: Decreased  Handedness: Left   Mood and Affect  Mood: Anxious  Affect: Congruent   Thought Process  Thought Processes: Goal Directed  Descriptions of Associations:Circumstantial  Orientation:Partial  Thought Content:Scattered  History of Schizophrenia/Schizoaffective disorder:Yes  Duration of Psychotic Symptoms:Greater than six months  Hallucinations:No data recorded Ideas of Reference:None  Suicidal Thoughts:No data recorded Homicidal Thoughts:No data recorded  Sensorium  Memory: Remote Good  Judgment: Fair  Insight: Fair   Art therapist  Concentration: Fair  Attention Span: Fair  Recall: [employer] of Knowledge: Fair  Language: Fair   Psychomotor Activity  Psychomotor Activity:No data recorded  Assets  Assets:No data recorded  Sleep  Sleep:No data recorded   Physical Exam: Physical Exam Vitals and nursing note reviewed.  Constitutional:      Appearance: Normal appearance.  HENT:     Head: Normocephalic and atraumatic.     Mouth/Throat:     Pharynx: Oropharynx is clear.  Eyes:     Pupils: Pupils are equal, round, and reactive to light.  Cardiovascular:     Rate and Rhythm: Normal rate and regular  rhythm.  Pulmonary:     Effort: Pulmonary effort is normal.     Breath sounds: Normal breath sounds.  Abdominal:     General: Abdomen is flat.     Palpations: Abdomen is soft.  Musculoskeletal:        General: Normal range of motion.  Skin:    General: Skin is warm and dry.  Neurological:     General: No focal deficit present.     Mental Status: He is alert. Mental status is at baseline.  Psychiatric:        Attention and Perception: He is inattentive.        Mood and Affect: Affect is blunt.        Speech: He is noncommunicative.    Review of Systems  Constitutional: Negative.   HENT: Negative.    Eyes: Negative.   Respiratory: Negative.    Cardiovascular: Negative.   Gastrointestinal: Negative.   Musculoskeletal: Negative.   Skin: Negative.   Neurological: Negative.    Blood pressure 115/87, pulse 89, temperature 98.6 F (37 C), temperature source Oral, resp. rate 20, height 5\' 8"  (1.727 m), weight 85.8 kg, SpO2 98 %. Body mass index is 28.76 kg/m.   Treatment Plan Summary: Medication management and Plan no change to medication.  Supportive encouragement.  Continue to discuss discharge planning daily.  , MD 11/06/2022, 12:48 PM

## 2022-11-06 NOTE — Group Note (Signed)
[facility] LCSW Group Therapy Note   Group Date: 11/06/2022 Start Time: 1300 End Time: 1400   Type of Therapy/Topic:  Group Therapy:  Emotion Regulation  Participation Level:  Did Not Attend   Mood:  Description of Group:    The purpose of this group is to assist patients in learning to regulate negative emotions and experience positive emotions. Patients will be guided to discuss ways in which they have been vulnerable to their negative emotions. These vulnerabilities will be juxtaposed with experiences of positive emotions or situations, and patients challenged to use positive emotions to combat negative ones. Special emphasis will be placed on coping with negative emotions in conflict situations, and patients will process healthy conflict resolution skills.  Therapeutic Goals: Patient will identify two positive emotions or experiences to reflect on in order to balance out negative emotions:  Patient will label two or more emotions that they find the most difficult to experience:  Patient will be able to demonstrate positive conflict resolution skills through discussion or role plays:   Summary of Patient Progress:   Patient did not attend group despite encouraged participation.     Therapeutic Modalities:   Cognitive Behavioral Therapy Feelings Identification Dialectical Behavioral Therapy   Kymani Laursen W Dalia Jollie, LCSWA 

## 2022-11-06 NOTE — Progress Notes (Signed)
Recreation Therapy Notes  Date: 11/06/2022   Time: 10:00 am      Location: Craft room    Behavioral response: N/A   Intervention Topic: Values    Discussion/Intervention: Patient refused to attend group.    Clinical Observations/Feedback:  Patient refused to attend group.    Adam [name] LRT/CTRS          Adam [name] 11/06/2022 11:01 AM 

## 2022-11-06 NOTE — Plan of Care (Signed)
  Problem: Coping: Goal: Ability to identify and develop effective coping behavior will improve Outcome: Progressing   Problem: Self-Concept: Goal: Ability to identify factors that promote anxiety will improve Outcome: Progressing Goal: Level of anxiety will decrease Outcome: Progressing Goal: Ability to modify response to factors that promote anxiety will improve Outcome: Progressing   Problem: Nutrition: Goal: Adequate nutrition will be maintained Outcome: Progressing   Problem: Coping: Goal: Level of anxiety will decrease Outcome: Progressing   Problem: Activity: Goal: Interest or engagement in leisure activities will improve Outcome: Progressing

## 2022-11-06 NOTE — [facility] Counselor (Signed)
CSW again attempted to contact [facility] with [facility].   [name], MSW, LCSW 11/06/2022 1:57 PM

## 2022-11-07 NOTE — Progress Notes (Signed)
Patient comes to nurses station for multiple needs. Patients mood labile with his needs. Patient pleasant and cooperative most of the shift. Compliant with medications. Appetite and energy level good. Support and encouragement given.

## 2022-11-07 NOTE — Group Note (Signed)
[facility] LCSW Group Therapy Note   Group Date: 11/07/2022 Start Time: 1300 End Time: 1400   Type of Therapy/Topic:  Group Therapy:  Emotion Regulation  Participation Level:  None    Description of Group:    The purpose of this group is to assist patients in learning to regulate negative emotions and experience positive emotions. Patients will be guided to discuss ways in which they have been vulnerable to their negative emotions. These vulnerabilities will be juxtaposed with experiences of positive emotions or situations, and patients challenged to use positive emotions to combat negative ones. Special emphasis will be placed on coping with negative emotions in conflict situations, and patients will process healthy conflict resolution skills.  Therapeutic Goals: Patient will identify two positive emotions or experiences to reflect on in order to balance out negative emotions:  Patient will label two or more emotions that they find the most difficult to experience:  Patient will be able to demonstrate positive conflict resolution skills through discussion or role plays:   Summary of Patient Progress: Patient was the only person who attended group. CSW asked if pt was ok and pt shook his head in the affirmative and said his name. He accepted not being able to have group due to lack of peers without incident.    Therapeutic Modalities:   Cognitive Behavioral Therapy Feelings Identification Dialectical Behavioral Therapy   Glenis Smoker, LCSW

## 2022-11-07 NOTE — Plan of Care (Signed)
Pt denies anxiety/depression at this time. Pt denies SI/HI/AVH or pain at this time. Pt is calm and cooperative. Pt is medication compliant. Pt provided with support and encouragement. Pt monitored q15 minutes for safety per unit policy. Plan of care ongoing.   Problem: Self-Concept: Goal: Level of anxiety will decrease Outcome: Progressing   Problem: Self-Concept: Goal: Ability to identify factors that promote anxiety will improve Outcome: Not Progressing

## 2022-11-07 NOTE — Progress Notes (Signed)
[facility] MD Progress Note  11/07/2022 11:14 AM Adam [name]  MRN:  [id number] Subjective: Follow-up 61 year old man with schizophrenia.  No new complaints.  This morning when I came to see him he seemed to be in a grumpy mood.  This happens from time to time and does not necessarily seem to be related to any particular trend in overall condition.  Offered words of encouragement. 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)   Stroke (HCC)   HTN (hypertension)   HLD (hyperlipidemia)   CAD (coronary artery disease)   Chronic systolic CHF (congestive heart failure) (HCC)   Anxiety   Infection of left earlobe  Total Time spent with patient: 30 minutes  Past Psychiatric History: Past history of schizophrenia and stroke  Past Medical History:  Past Medical History:  Diagnosis Date   Myocardial infarction (HCC)    Schizophrenia (HCC)    Stroke (HCC)     Past Surgical History:  Procedure Laterality Date   NO PAST SURGERIES     Family History:  Family History  Problem Relation Age of Onset   Hypertension Mother    Family Psychiatric  History: None Social History:  Social History   Substance and Sexual Activity  Alcohol Use Not Currently     Social History   Substance and Sexual Activity  Drug Use Not Currently    Social History   Socioeconomic History   Marital status: Single    Spouse name: Not on file   Number of children: Not on file   Years of education: Not on file   Highest education level: Not on file  Occupational History   Not on file  Tobacco Use   Smoking status: Never    Passive exposure: Never   Smokeless tobacco: Never  Vaping Use   Vaping Use: Unknown  Substance and Sexual Activity   Alcohol use: Not Currently   Drug use: Not  Currently   Sexual activity: Not Currently  Other Topics Concern   Not on file  Social History Narrative   Not on file   Social Determinants of Health   Financial Resource Strain: Not on file  Food Insecurity: Not on file  Transportation Needs: Not on file  Physical Activity: Not on file  Stress: Not on file  Social Connections: Not on file   Additional Social History:  Specify valuables returned:  (none)                      Sleep: Fair  Appetite:  Fair  Current Medications: Current Facility-Administered Medications  Medication Dose Route Frequency Provider Last Rate Last Admin   acetaminophen (TYLENOL) tablet 650 mg  650 mg Oral Q6H PRN Trinitey Roache T, MD   650 mg at 10/14/22 2328   alum & mag hydroxide-simeth (MAALOX/MYLANTA) 200-200-20 MG/5ML suspension 30 mL  30 mL Oral Q4H PRN Felma Pfefferle T, MD   30 mL at 04/26/22 2841   aspirin EC tablet 81 mg  81 mg Oral Daily Gareld Obrecht, [name], MD   81 mg at 11/07/22 0807   atorvastatin (LIPITOR) tablet 20 mg  20 mg Oral Daily Greco Gastelum T, MD   20 mg at 11/07/22 0807   atropine 1 % ophthalmic solution 1 drop  1 drop Sublingual QID [name], MD   1 drop at 11/07/22 [phone]  benztropine (COGENTIN) tablet 0.5 mg  0.5 mg Oral BID Aymee Fomby T, MD   0.5 mg at 11/07/22 0806   clonazePAM (KLONOPIN) tablet 1 mg  1 mg Oral TID PRN He, [name], MD   1 mg at 10/14/22 1105   cloZAPine (CLOZARIL) tablet 300 mg  300 mg Oral QHS Shiva Sahagian T, MD   300 mg at 11/06/22 2105   diphenhydrAMINE (BENADRYL) capsule 50 mg  50 mg Oral Q6H PRN Eilan Mcinerny T, MD   50 mg at 09/30/22 2119   Or   diphenhydrAMINE (BENADRYL) injection 50 mg  50 mg Intramuscular Q6H PRN Aysel Gilchrest T, MD       feeding supplement (ENSURE ENLIVE / ENSURE PLUS) liquid 237 mL  237 mL Oral BID BM Johnathan Heskett T, MD   237 mL at 11/07/22 1008   gabapentin (NEURONTIN) capsule 300 mg  300 mg Oral TID Sanae Willetts T, MD   300 mg at 11/07/22 1008   haloperidol (HALDOL)  tablet 2 mg  2 mg Oral TID Joory Gough T, MD   2 mg at 11/07/22 1007   haloperidol (HALDOL) tablet 5 mg  5 mg Oral Q6H PRN Pieper Kasik T, MD   5 mg at 09/17/22 1532   Or   haloperidol lactate (HALDOL) injection 5 mg  5 mg Intramuscular Q6H PRN Areli Jowett T, MD       lisinopril (ZESTRIL) tablet 5 mg  5 mg Oral Daily Rennae Ferraiolo T, MD   5 mg at 11/07/22 3295   magnesium hydroxide (MILK OF MAGNESIA) suspension 30 mL  30 mL Oral Daily PRN Johncarlo Maalouf T, MD   30 mL at 11/03/22 0803   metoprolol succinate (TOPROL-XL) 24 hr tablet 25 mg  25 mg Oral Daily Jonny Dearden T, MD   25 mg at 11/07/22 0807   ondansetron (ZOFRAN) tablet 4 mg  4 mg Oral Q8H PRN Lerry Liner M, NP   4 mg at 09/06/22 2320   paliperidone (INVEGA SUSTENNA) injection 156 mg  156 mg Intramuscular Q28 days Lillyana Majette T, MD   156 mg at 10/23/22 1715   paliperidone (INVEGA) 24 hr tablet 6 mg  6 mg Oral QHS Mirtie Bastyr T, MD   6 mg at 11/06/22 2105   senna-docusate (Senokot-S) tablet 2 tablet  2 tablet Oral QHS PRN Sarina Ill, DO   2 tablet at 09/15/22 2110   temazepam (RESTORIL) capsule 15 mg  15 mg Oral QHS Kayleanna Lorman T, MD   15 mg at 11/06/22 2105   ziprasidone (GEODON) injection 20 mg  20 mg Intramuscular Q12H PRN Kristyl Athens, [name], MD   20 mg at 07/08/22 1453    Lab Results: No results found for this or any previous visit (from the past 48 hour(s)).  Blood Alcohol level:  Lab Results  Component Value Date   ETH <10 07/20/2021    Metabolic Disorder Labs: Lab Results  Component Value Date   HGBA1C 5.5 04/10/2022   MPG 111.15 04/10/2022   No results found for: "PROLACTIN" Lab Results  Component Value Date   CHOL 167 11/20/2021   TRIG 107 11/20/2021   HDL 26 (L) 11/20/2021   CHOLHDL 6.4 11/20/2021   VLDL 21 11/20/2021   LDLCALC 120 (H) 11/20/2021    Physical Findings: AIMS: Facial and Oral Movements Muscles of Facial Expression: None, normal Lips and Perioral Area: None, normal Jaw:  None, normal Tongue: None, normal,Extremity Movements Upper (arms, wrists, hands, fingers): None, normal  Lower (legs, knees, ankles, toes): None, normal, Trunk Movements Neck, shoulders, hips: None, normal, Overall Severity Severity of abnormal movements (highest score from questions above): None, normal Incapacitation due to abnormal movements: None, normal Patient's awareness of abnormal movements (rate only patient's report): No Awareness, Dental Status Current problems with teeth and/or dentures?: No Does patient usually wear dentures?: No  CIWA:    COWS:     Musculoskeletal: Strength & Muscle Tone: within normal limits Gait & Station: normal Patient leans: N/A  Psychiatric Specialty Exam:  Presentation  General Appearance:  Appropriate for Environment; Casual; Neat; Well Groomed  Eye Contact: Fair  Speech: Slow  Speech Volume: Decreased  Handedness: Left   Mood and Affect  Mood: Anxious  Affect: Congruent   Thought Process  Thought Processes: Goal Directed  Descriptions of Associations:Circumstantial  Orientation:Partial  Thought Content:Scattered  History of Schizophrenia/Schizoaffective disorder:Yes  Duration of Psychotic Symptoms:Greater than six months  Hallucinations:No data recorded Ideas of Reference:None  Suicidal Thoughts:No data recorded Homicidal Thoughts:No data recorded  Sensorium  Memory: Remote Good  Judgment: Fair  Insight: Fair   Art therapist  Concentration: Fair  Attention Span: Fair  Recall: [employer] of Knowledge: Fair  Language: Fair   Psychomotor Activity  Psychomotor Activity:No data recorded  Assets  Assets:No data recorded  Sleep  Sleep:No data recorded   Physical Exam: Physical Exam Vitals and nursing note reviewed.  Constitutional:      Appearance: Normal appearance.  HENT:     Head: Normocephalic and atraumatic.     Mouth/Throat:     Pharynx: Oropharynx is clear.   Eyes:     Pupils: Pupils are equal, round, and reactive to light.  Cardiovascular:     Rate and Rhythm: Normal rate and regular rhythm.  Pulmonary:     Effort: Pulmonary effort is normal.     Breath sounds: Normal breath sounds.  Abdominal:     General: Abdomen is flat.     Palpations: Abdomen is soft.  Musculoskeletal:        General: Normal range of motion.  Skin:    General: Skin is warm and dry.  Neurological:     General: No focal deficit present.     Mental Status: He is alert. Mental status is at baseline.  Psychiatric:        Attention and Perception: Attention normal.        Mood and Affect: Affect is blunt.    Review of Systems  Unable to perform ROS: Language   Blood pressure 112/85, pulse (!) 105, temperature 98.6 F (37 C), temperature source Oral, resp. rate 20, height 5\' 8"  (1.727 m), weight 85.8 kg, SpO2 98 %. Body mass index is 28.76 kg/m.   Treatment Plan Summary: Plan totally stable.  No change in overall presentation.  Continues to be completely stable and in need of placement which is his only reason for still being here.  , MD 11/07/2022, 11:14 AM

## 2022-11-07 NOTE — Progress Notes (Signed)
Recreation Therapy Notes    Date: 11/07/2022  Time: 11:00 am     Location: Craft room   Behavioral response: N/A   Intervention Topic: Time Management   Discussion/Intervention: Patient refused to attend group.   Clinical Observations/Feedback:  Patient refused to attend group.    Ilia Engelbert LRT/CTRS        Jerri Hargadon 11/07/2022 12:41 PM

## 2022-11-07 NOTE — Progress Notes (Signed)
Patient pleasant and cooperative.  He was allowed to shave and in a much better mood. He is active on the unit on the phone with his mom or hanging out in the dayroom.  He is med compliant and took his meds without issue.  He denies having any psychiatric symptoms.  He is safe on the unit with q 15  minute safety checks. Encouraged to come to staff with any concerns.    C Butler-Nicholson, LPN

## 2022-11-07 NOTE — [facility] Counselor (Signed)
CSW team met with [facility] (K. Baldwin-Soles and L. Oxendine) to discuss placement issues for this pt.   Oxendine updated that continued attempts at contact have been made for [facility]", however, she has been unable to speak with anyone directly. She also shared that mother, [place] continues to maintain that she would not like for pt to go to an group home but would prefer a respite in [place].   Baldwin-Soles shared that A. Elnoria Howard has given option of a family care home [facility]) which is requesting a psychiatric assessment. CSW team informed that [facility] has been the only ones willing to entertain talking about this pt. CSW team was asked to speak with provider regarding updating the H&P. [facility] and CSW team discussed this briefly. Team agreed to bring this to the provider.     Oxendine voiced concerns regarding pt elopement potential due to mother sharing a history of elopement.   CSW shared that referral has been made to [facility] but team is still waiting to hear back from them.   Baldwin-Soles asked for an update regarding pt behavior. She was informed that he still remains compliant and well behaved within the unit.   Action items include: Continued attempts at contacting [facility]" and updating mother regarding process. CSW team to speak with provider regarding updated H&P. No other concerns expressed. Contact ended without incident.   [name]. Guerry Bruin, MSW, LCSW, [place] 11/07/2022 3:49 PM

## 2022-11-07 NOTE — B[facility] Counselor (Signed)
CSW attempted to follow up with Adam [name] at [facility].  CSW unable to leave message as voicemail is full.  [name], MSW, LCSW 11/07/2022 11:35 AM

## 2022-11-08 NOTE — Group Note (Signed)
[facility] LCSW Group Therapy Note   Group Date: 11/08/2022 Start Time: 1300 End Time: 1400   Type of Therapy/Topic:  Group Therapy:  Balance in Life  Participation Level:  Did Not Attend   Description of Group:    This group will address the concept of balance and how it feels and looks when one is unbalanced. Patients will be encouraged to process areas in their lives that are out of balance, and identify reasons for remaining unbalanced. Facilitators will guide patients utilizing problem- solving interventions to address and correct the stressor making their life unbalanced. Understanding and applying boundaries will be explored and addressed for obtaining  and maintaining a balanced life. Patients will be encouraged to explore ways to assertively make their unbalanced needs known to significant others in their lives, using other group members and facilitator for support and feedback.  Therapeutic Goals: Patient will identify two or more emotions or situations they have that consume much of in their lives. Patient will identify signs/triggers that life has become out of balance:  Patient will identify two ways to set boundaries in order to achieve balance in their lives:  Patient will demonstrate ability to communicate their needs through discussion and/or role plays  Summary of Patient Progress:    Group unable to be held due to complex discharges and discharges planning. CSW provided patient with worksheets.    Therapeutic Modalities:   Cognitive Behavioral Therapy Solution-Focused Therapy Assertiveness Training   Terek Bee J Jovontae Banko, LCSW 

## 2022-11-08 NOTE — Progress Notes (Signed)
Recreation Therapy Notes   Date: 11/08/2022  Time: 2:15pm  Location: Courtyard   Behavioral response: N/A   Group Type: Recreation and Leisure  Participation level: N/A  Communication: Patient refused to attend group.   Comments: N/A  Felicitas Sine LRT/CTRS        Hermen Mario 11/08/2022 3:59 PM

## 2022-11-08 NOTE — Progress Notes (Signed)
Recreation Therapy Notes  Date: 11/08/2022  Time: 10:15 am    Location: Craft room   Behavioral response: Appropriate   Intervention Topic: Relaxation  Discussion/Intervention:  Group content today was focused on relaxation. The group defined relaxation and identified healthy ways to relax. Individuals expressed how much time they spend relaxing. Patients expressed how much their life would be if they did not make time for themselves to relax. The group stated ways they could improve their relaxation techniques in the future.  Individuals participated in the intervention "Time to Relax" where they had a chance to experience different relaxation techniques.  Clinical Observations/Feedback: Patient came to group and was focused on what peers and staff had to say about relaxation. Individual was social with peers and staff while participating in the intervention.        Adam [name] LRT/CTRS         [name] 11/08/2022 12:09 PM

## 2022-11-08 NOTE — Plan of Care (Signed)
Pt denies anxiety/depression at this time. Pt denies SI/HI/AVH or pain at this time. Pt is calm and cooperative. Pt is medication compliant. Pt provided with support and encouragement. Pt monitored q15 minutes for safety per unit policy. Plan of care ongoing.   Problem: Self-Concept: Goal: Level of anxiety will decrease Outcome: Progressing   Problem: Education: Goal: Knowledge of General Education information will improve Description: Including pain rating scale, medication(s)/side effects and non-pharmacologic comfort measures Outcome: Progressing

## 2022-11-08 NOTE — Progress Notes (Signed)
[facility] MD Progress Note  11/08/2022 11:27 AM Adam [name]  MRN:  [id number] Subjective: Patient seen and chart reviewed.  Patient has no new complaints.  Behavior unchanged.  Takes care of himself well with appropriate requests for help.  No dangerous behavior. 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[place])   Stroke (HCC)   HTN (hypertension)   HLD (hyperlipidemia)   CAD (coronary artery disease)   Chronic systolic CHF (congestive heart failure) (HCC)   Anxiety   Infection of left earlobe  Total Time spent with patient: 30 minutes  Past Psychiatric History: Past history of schizophrenia  Past Medical History:  Past Medical History:  Diagnosis Date   Myocardial infarction ([place])    Schizophrenia ([place])    Stroke ([place])     Past Surgical History:  Procedure Laterality Date   NO PAST SURGERIES     Family History:  Family History  Problem Relation Age of Onset   Hypertension Mother    Family Psychiatric  History: See previous Social History:  Social History   Substance and Sexual Activity  Alcohol Use Not Currently     Social History   Substance and Sexual Activity  Drug Use Not Currently    Social History   Socioeconomic History   Marital status: Single    Spouse name: Not on file   Number of children: Not on file   Years of education: Not on file   Highest education level: Not on file  Occupational History   Not on file  Tobacco Use   Smoking status: Never    Passive exposure: Never   Smokeless tobacco: Never  Vaping Use   Vaping Use: Unknown  Substance and Sexual Activity   Alcohol use: Not Currently   Drug use: Not Currently   Sexual activity: Not Currently  Other Topics Concern   Not on file  Social History Narrative   Not on file    Social Determinants of Health   Financial Resource Strain: Not on file  Food Insecurity: Not on file  Transportation Needs: Not on file  Physical Activity: Not on file  Stress: Not on file  Social Connections: Not on file   Additional Social History:  Specify valuables returned:  (none)                      Sleep: Fair  Appetite:  Fair  Current Medications: Current Facility-Administered Medications  Medication Dose Route Frequency Provider Last Rate Last Admin   acetaminophen (TYLENOL) tablet 650 mg  650 mg Oral Q6H PRN Dorene Bruni T, MD   650 mg at 10/14/22 2328   alum & mag hydroxide-simeth (MAALOX/MYLANTA) 200-200-20 MG/5ML suspension 30 mL  30 mL Oral Q4H PRN Lorian Yaun T, MD   30 mL at 04/26/22 [id number]   aspirin EC tablet 81 mg  81 mg Oral Daily Vonn Sliger, [name], MD   81 mg at 11/08/22 0934   atorvastatin (LIPITOR) tablet 20 mg  20 mg Oral Daily Tyyonna Soucy T, MD   20 mg at 11/08/22 0933   atropine 1 % ophthalmic solution 1 drop  1 drop Sublingual QID [name], MD   1 drop at 11/08/22 0933   benztropine (COGENTIN) tablet 0.5 mg  0.5 mg Oral BID Latisia Hilaire, [name], MD   0.5 mg at 11/08/22 [phone]  clonazePAM (KLONOPIN) tablet 1 mg  1 mg Oral TID PRN He, [name], MD   1 mg at 10/14/22 1105   cloZAPine (CLOZARIL) tablet 300 mg  300 mg Oral QHS Sharika Mosquera T, MD   300 mg at 11/07/22 2103   diphenhydrAMINE (BENADRYL) capsule 50 mg  50 mg Oral Q6H PRN Melissia Lahman T, MD   50 mg at 09/30/22 2119   Or   diphenhydrAMINE (BENADRYL) injection 50 mg  50 mg Intramuscular Q6H PRN Xzavior Reinig T, MD       feeding supplement (ENSURE ENLIVE / ENSURE PLUS) liquid 237 mL  237 mL Oral BID BM Enslie Sahota T, MD   237 mL at 11/08/22 1031   gabapentin (NEURONTIN) capsule 300 mg  300 mg Oral TID Anikin Prosser T, MD   300 mg at 11/08/22 0933   haloperidol (HALDOL) tablet 2 mg  2 mg Oral TID Ellianna Ruest, [name], MD   2 mg at 11/08/22 8182   haloperidol (HALDOL) tablet 5 mg  5 mg Oral Q6H PRN  Keairra Bardon T, MD   5 mg at 09/17/22 1532   Or   haloperidol lactate (HALDOL) injection 5 mg  5 mg Intramuscular Q6H PRN Demorio [name] T, MD       lisinopril (ZESTRIL) tablet 5 mg  5 mg Oral Daily Kairo Laubacher T, MD   5 mg at 11/08/22 0933   magnesium hydroxide (MILK OF MAGNESIA) suspension 30 mL  30 mL Oral Daily PRN Ohanna Gassert T, MD   30 mL at 11/03/22 0803   metoprolol succinate (TOPROL-XL) 24 hr tablet 25 mg  25 mg Oral Daily Chanan Detwiler T, MD   25 mg at 11/08/22 0933   ondansetron (ZOFRAN) tablet 4 mg  4 mg Oral Q8H PRN Lerry Liner M, NP   4 mg at 09/06/22 2320   paliperidone (INVEGA SUSTENNA) injection 156 mg  156 mg Intramuscular Q28 days Izic Stfort T, MD   156 mg at 10/23/22 1715   paliperidone (INVEGA) 24 hr tablet 6 mg  6 mg Oral QHS Haniah Penny T, MD   6 mg at 11/07/22 2103   senna-docusate (Senokot-S) tablet 2 tablet  2 tablet Oral QHS PRN Sarina Ill, DO   2 tablet at 09/15/22 2110   temazepam (RESTORIL) capsule 15 mg  15 mg Oral QHS Madhavi Hamblen T, MD   15 mg at 11/07/22 2103   ziprasidone (GEODON) injection 20 mg  20 mg Intramuscular Q12H PRN Acen Craun, [name], MD   20 mg at 07/08/22 1453    Lab Results: No results found for this or any previous visit (from the past 48 hour(s)).  Blood Alcohol level:  Lab Results  Component Value Date   ETH <10 07/20/2021    Metabolic Disorder Labs: Lab Results  Component Value Date   HGBA1C 5.5 04/10/2022   MPG 111.15 04/10/2022   No results found for: "PROLACTIN" Lab Results  Component Value Date   CHOL 167 11/20/2021   TRIG 107 11/20/2021   HDL 26 (L) 11/20/2021   CHOLHDL 6.4 11/20/2021   VLDL 21 11/20/2021   LDLCALC 120 (H) 11/20/2021    Physical Findings: AIMS: Facial and Oral Movements Muscles of Facial Expression: None, normal Lips and Perioral Area: None, normal Jaw: None, normal Tongue: None, normal,Extremity Movements Upper (arms, wrists, hands, fingers): None, normal Lower (legs,  knees, ankles, toes): None, normal, Trunk Movements Neck, shoulders, hips: None, normal, Overall Severity Severity of abnormal movements (highest score from  questions above): None, normal Incapacitation due to abnormal movements: None, normal Patient's awareness of abnormal movements (rate only patient's report): No Awareness, Dental Status Current problems with teeth and/or dentures?: No Does patient usually wear dentures?: No  CIWA:    COWS:     Musculoskeletal: Strength & Muscle Tone: within normal limits Gait & Station: normal Patient leans: N/A  Psychiatric Specialty Exam:  Presentation  General Appearance:  Appropriate for Environment; Casual; Neat; Well Groomed  Eye Contact: Fair  Speech: Slow  Speech Volume: Decreased  Handedness: Left   Mood and Affect  Mood: Anxious  Affect: Congruent   Thought Process  Thought Processes: Goal Directed  Descriptions of Associations:Circumstantial  Orientation:Partial  Thought Content:Scattered  History of Schizophrenia/Schizoaffective disorder:Yes  Duration of Psychotic Symptoms:Greater than six months  Hallucinations:No data recorded Ideas of Reference:None  Suicidal Thoughts:No data recorded Homicidal Thoughts:No data recorded  Sensorium  Memory: Remote Good  Judgment: Fair  Insight: Fair   Community education officer  Concentration: Fair  Attention Span: Fair  Recall: [place] of Knowledge: Fair  Language: Fair   Psychomotor Activity  Psychomotor Activity:No data recorded  Assets  Assets:No data recorded  Sleep  Sleep:No data recorded   Physical Exam: Physical Exam Vitals reviewed.  Constitutional:      Appearance: Normal appearance.  HENT:     Head: Normocephalic and atraumatic.     Mouth/Throat:     Pharynx: Oropharynx is clear.  Eyes:     Pupils: Pupils are equal, round, and reactive to light.  Cardiovascular:     Rate and Rhythm: Normal rate and regular rhythm.   Pulmonary:     Effort: Pulmonary effort is normal.     Breath sounds: Normal breath sounds.  Abdominal:     General: Abdomen is flat.     Palpations: Abdomen is soft.  Musculoskeletal:        General: Normal range of motion.  Skin:    General: Skin is warm and dry.  Neurological:     General: No focal deficit present.     Mental Status: He is alert. Mental status is at baseline.  Psychiatric:        Attention and Perception: Attention normal.        Mood and Affect: Mood normal.        Speech: He is noncommunicative.    Review of Systems  Unable to perform ROS: Language   Blood pressure 111/76, pulse (!) 106, temperature 98.6 F (37 C), temperature source Oral, resp. rate 20, height 5\' 8"  (1.727 m), weight 85.8 kg, SpO2 98 %. Body mass index is 28.76 kg/m.   Treatment Plan Summary: Plan stable.  No change to presentation.  No change to overall treatment plan which is to focus on discharge.  Alethia Berthold, MD 11/08/2022, 11:27 AM

## 2022-11-08 NOTE — Plan of Care (Signed)
D: Patient alert and oriented. Patient denies pain. Patient denies anxiety and depression. Patient denies SI/HI/AVH.  A: Scheduled medications administered to patient, per MD orders.  Support and encouragement provided to patient.  Q15 minute safety checks maintained.   R: Patient compliant with medication administration and treatment plan. No adverse drug reactions noted. Patient remains safe on the unit at this time. Problem: Education: Goal: Ability to state activities that reduce stress will improve Outcome: Progressing   Problem: Education: Goal: Knowledge of General Education information will improve Description: Including pain rating scale, medication(s)/side effects and non-pharmacologic comfort measures Outcome: Progressing   Problem: Clinical Measurements: Goal: Ability to maintain clinical measurements within normal limits will improve Outcome: Progressing   Problem: Nutrition: Goal: Adequate nutrition will be maintained Outcome: Progressing

## 2022-11-09 NOTE — [facility] Counselor (Signed)
CW again attempted to contact Barry at [place].  CSW left HIPAA compliant voicemail.  [name], MSW, LCSW 11/09/2022 1:44 PM

## 2022-11-09 NOTE — Progress Notes (Addendum)
Recreation Therapy Notes   Date: 11/09/2022  Time: 10:45am    Location: Craft room   Behavioral response: Appropriate   Intervention Topic: Leisure   Discussion/Intervention:  Group content today was focused on leisure. The group defined what leisure is and some positive leisure activities they participate in. Individuals identified the difference between good and bad leisure. Participants expressed how they feel after participating in the leisure of their choice. The group discussed how they go about picking a leisure activity and if others are involved in their leisure activities. The patient stated how many leisure activities they have to choose from and reasons why it is important to have leisure time. Individuals participated in the intervention "Exploration of Leisure" where they had a chance to identify new leisure activities as well as benefits of leisure. Clinical Observations/Feedback: Patient came to group late due to unknown reasons. Individual was social with peers and staff while participating in the intervention.        Rhetta Cleek LRT/CTRS         Donevin Sainsbury 11/09/2022 12:37 PM

## 2022-11-09 NOTE — Progress Notes (Signed)
[facility] MD Progress Note  11/09/2022 3:19 PM Adam [name]  MRN:  [id number] Subjective: Follow-up patient with schizophrenia.  No new complaints.  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  Remains very stable  Past Medical History:  Past Medical History:  Diagnosis Date   Myocardial infarction (HCC)    Schizophrenia (HCC)    Stroke (HCC)     Past Surgical History:  Procedure Laterality Date   NO PAST SURGERIES     Family History:  Family History  Problem Relation Age of Onset   Hypertension Mother    Family Psychiatric  History: See previous Social History:  Social History   Substance and Sexual Activity  Alcohol Use Not Currently     Social History   Substance and Sexual Activity  Drug Use Not Currently    Social History   Socioeconomic History   Marital status: Single    Spouse name: Not on file   Number of children: Not on file   Years of education: Not on file   Highest education level: Not on file  Occupational History   Not on file  Tobacco Use   Smoking status: Never    Passive exposure: Never   Smokeless tobacco: Never  Vaping Use   Vaping Use: Unknown  Substance and Sexual Activity   Alcohol use: Not Currently   Drug use: Not Currently   Sexual activity: Not Currently  Other Topics Concern   Not on file  Social History Narrative   Not on file   Social Determinants of Health   Financial Resource Strain: Not on file   Food Insecurity: Not on file  Transportation Needs: Not on file  Physical Activity: Not on file  Stress: Not on file  Social Connections: Not on file   Additional Social History:  Specify valuables returned:  (none)                      Sleep: Fair  Appetite:  Fair  Current Medications: Current Facility-Administered Medications  Medication Dose Route Frequency Provider Last Rate Last Admin   acetaminophen (TYLENOL) tablet 650 mg  650 mg Oral Q6H PRN Zakariah Urwin T, MD   650 mg at 10/14/22 2328   alum & mag hydroxide-simeth (MAALOX/MYLANTA) 200-200-20 MG/5ML suspension 30 mL  30 mL Oral Q4H PRN Skye Rodarte T, MD   30 mL at 04/26/22 8413   aspirin EC tablet 81 mg  81 mg Oral Daily Pina Sirianni T, MD   81 mg at 11/09/22 1146   atorvastatin (LIPITOR) tablet 20 mg  20 mg Oral Daily Koa Palla T, MD   20 mg at 11/09/22 1147   atropine 1 % ophthalmic solution 1 drop  1 drop Sublingual QID [name], MD   1 drop at 11/09/22 1147   benztropine (COGENTIN) tablet 0.5 mg  0.5 mg Oral BID Rue Valladares T, MD   0.5 mg at 11/09/22 1147   clonazePAM (KLONOPIN) tablet 1 mg  1 mg Oral TID PRN He,  [name], MD   1 mg at 10/14/22 1105   cloZAPine (CLOZARIL) tablet 300 mg  300 mg Oral QHS Akaiya Touchette T, MD   300 mg at 11/08/22 2111   diphenhydrAMINE (BENADRYL) capsule 50 mg  50 mg Oral Q6H PRN Catelynn Sparger T, MD   50 mg at 09/30/22 2119   Or   diphenhydrAMINE (BENADRYL) injection 50 mg  50 mg Intramuscular Q6H PRN Zoila Ditullio T, MD       feeding supplement (ENSURE ENLIVE / ENSURE PLUS) liquid 237 mL  237 mL Oral BID BM Elaine Roanhorse T, MD   237 mL at 11/08/22 1405   gabapentin (NEURONTIN) capsule 300 mg  300 mg Oral TID Dayveon Halley T, MD   300 mg at 11/09/22 1147   haloperidol (HALDOL) tablet 2 mg  2 mg Oral TID Gilmar Bua T, MD   2 mg at 11/09/22 1146   haloperidol (HALDOL) tablet 5 mg  5 mg Oral Q6H PRN Lakevia Perris T, MD   5 mg at 09/17/22 1532   Or   haloperidol lactate  (HALDOL) injection 5 mg  5 mg Intramuscular Q6H PRN Birl Lobello T, MD       lisinopril (ZESTRIL) tablet 5 mg  5 mg Oral Daily Russell Engelstad T, MD   5 mg at 11/09/22 1147   magnesium hydroxide (MILK OF MAGNESIA) suspension 30 mL  30 mL Oral Daily PRN Charon Akamine T, MD   30 mL at 11/03/22 0803   metoprolol succinate (TOPROL-XL) 24 hr tablet 25 mg  25 mg Oral Daily Lakiesha Ralphs T, MD   25 mg at 11/09/22 1147   ondansetron (ZOFRAN) tablet 4 mg  4 mg Oral Q8H PRN Lerry Liner M, NP   4 mg at 09/06/22 2320   paliperidone (INVEGA SUSTENNA) injection 156 mg  156 mg Intramuscular Q28 days Rex Oesterle T, MD   156 mg at 10/23/22 1715   paliperidone (INVEGA) 24 hr tablet 6 mg  6 mg Oral QHS Jarelis Ehlert T, MD   6 mg at 11/08/22 2110   senna-docusate (Senokot-S) tablet 2 tablet  2 tablet Oral QHS PRN Sarina Ill, DO   2 tablet at 09/15/22 2110   temazepam (RESTORIL) capsule 15 mg  15 mg Oral QHS Debra Calabretta T, MD   15 mg at 11/08/22 2111   ziprasidone (GEODON) injection 20 mg  20 mg Intramuscular Q12H PRN Brennan Karam, [name], MD   20 mg at 07/08/22 1453    Lab Results: No results found for this or any previous visit (from the past 48 hour(s)).  Blood Alcohol level:  Lab Results  Component Value Date   ETH <10 07/20/2021    Metabolic Disorder Labs: Lab Results  Component Value Date   HGBA1C 5.5 04/10/2022   MPG 111.15 04/10/2022   No results found for: "PROLACTIN" Lab Results  Component Value Date   CHOL 167 11/20/2021   TRIG 107 11/20/2021   HDL 26 (L) 11/20/2021   CHOLHDL 6.4 11/20/2021   VLDL 21 11/20/2021   LDLCALC 120 (H) 11/20/2021    Physical Findings: AIMS: Facial and Oral Movements Muscles of Facial Expression: None, normal Lips and Perioral Area: None, normal Jaw: None, normal Tongue: None, normal,Extremity Movements Upper (arms, wrists, hands, fingers): None, normal Lower (legs, knees, ankles, toes): None, normal, Trunk Movements Neck, shoulders, hips:  None, normal, Overall Severity Severity of abnormal movements (highest score from questions above): None, normal Incapacitation due to abnormal movements: None, normal Patient's  awareness of abnormal movements (rate only patient's report): No Awareness, Dental Status Current problems with teeth and/or dentures?: No Does patient usually wear dentures?: No  CIWA:    COWS:     Musculoskeletal: Strength & Muscle Tone: within normal limits Gait & Station: normal Patient leans: N/A  Psychiatric Specialty Exam:  Presentation  General Appearance:  Appropriate for Environment; Casual; Neat; Well Groomed  Eye Contact: Fair  Speech: Slow  Speech Volume: Decreased  Handedness: Left   Mood and Affect  Mood: Anxious  Affect: Congruent   Thought Process  Thought Processes: Goal Directed  Descriptions of Associations:Circumstantial  Orientation:Partial  Thought Content:Scattered  History of Schizophrenia/Schizoaffective disorder:Yes  Duration of Psychotic Symptoms:Greater than six months  Hallucinations:No data recorded Ideas of Reference:None  Suicidal Thoughts:No data recorded Homicidal Thoughts:No data recorded  Sensorium  Memory: Remote Good  Judgment: Fair  Insight: Fair   Art therapist  Concentration: Fair  Attention Span: Fair  Recall: [employer] of Knowledge: Fair  Language: Fair   Psychomotor Activity  Psychomotor Activity:No data recorded  Assets  Assets:No data recorded  Sleep  Sleep:No data recorded   Physical Exam: Physical Exam Vitals and nursing note reviewed.  Constitutional:      Appearance: Normal appearance.  HENT:     Head: Normocephalic and atraumatic.     Mouth/Throat:     Pharynx: Oropharynx is clear.  Eyes:     Pupils: Pupils are equal, round, and reactive to light.  Cardiovascular:     Rate and Rhythm: Normal rate and regular rhythm.  Pulmonary:     Effort: Pulmonary effort is normal.      Breath sounds: Normal breath sounds.  Abdominal:     General: Abdomen is flat.     Palpations: Abdomen is soft.  Musculoskeletal:        General: Normal range of motion.  Skin:    General: Skin is warm and dry.  Neurological:     General: No focal deficit present.     Mental Status: He is alert. Mental status is at baseline.  Psychiatric:        Attention and Perception: He is inattentive.        Mood and Affect: Mood normal. Affect is blunt.        Speech: He is noncommunicative.        Thought Content: Thought content normal.    Review of Systems  Unable to perform ROS: Language   Blood pressure 120/77, pulse 98, temperature 98.6 F (37 C), temperature source Oral, resp. rate 20, height 5\' 8"  (1.727 m), weight 85.8 kg, SpO2 98 %. Body mass index is 28.76 kg/m.   Treatment Plan Summary: Medication management and Plan no change to overall treatment plan.  Continue to focus on discharge planning.  , MD 11/09/2022, 3:19 PM

## 2022-11-09 NOTE — Plan of Care (Signed)
Pt denies anxiety/depression at this time. Pt denies SI/HI/AVH or pain at this time. Pt is calm and cooperative. Pt is medication compliant. Pt provided with support and encouragement. Pt monitored q15 minutes for safety per unit policy. Plan of care ongoing.   Problem: Education: Goal: Ability to state activities that reduce stress will improve Outcome: Not Progressing   Problem: Coping: Goal: Ability to identify and develop effective coping behavior will improve Outcome: Not Progressing

## 2022-11-09 NOTE — Plan of Care (Signed)
D: Patient alert and oriented. Patient denies pain. Patient denies anxiety and depression. Patient denies SI/HI/AVH.  A: Scheduled medications administered to patient, per MD orders.  Support and encouragement provided to patient.  Q15 minute safety checks maintained.   R: Patient compliant with medication administration and treatment plan. No adverse drug reactions noted. Patient remains safe on the unit at this time.  Problem: Education: Goal: Knowledge of General Education information will improve Description: Including pain rating scale, medication(s)/side effects and non-pharmacologic comfort measures Outcome: Progressing   Problem: Clinical Measurements: Goal: Ability to maintain clinical measurements within normal limits will improve Outcome: Progressing   Problem: Safety: Goal: Ability to remain free from injury will improve Outcome: Progressing   Problem: Education: Goal: Will be free of psychotic symptoms Outcome: Progressing

## 2022-11-10 NOTE — Plan of Care (Signed)
Patient alert and cooperative on assessment.  Patient is medication compliant.  Patient out of room ambulating in halls and in day room interacting with other patients and staff.  Patient yelling out several times during the shift when his requests are denied.  Patient does not appear to be responding to stimuli.  No s/s of depression or pain noted, no complaints voiced.  Q 15 minute rounds in progress, will continue to monitor.  Problem: Activity: Goal: Risk for activity intolerance will decrease Outcome: Progressing   Problem: Pain Managment: Goal: General experience of comfort will improve Outcome: Progressing   Problem: Safety: Goal: Ability to remain free from injury will improve Outcome: Progressing   Problem: Activity: Goal: Interest or engagement in leisure activities will improve Outcome: Progressing   Problem: Coping: Goal: Ability to identify and develop effective coping behavior will improve Outcome: Not Progressing   Problem: Self-Concept: Goal: Ability to identify factors that promote anxiety will improve Outcome: Not Progressing Goal: Level of anxiety will decrease Outcome: Not Progressing

## 2022-11-10 NOTE — Progress Notes (Signed)
[facility] MD Progress Note  11/10/2022 3:03 PM Adam [name]  MRN:  [id number] Subjective: Follow-up patient with schizophrenia.  No new complaint no change to behavior. 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)   Stroke (HCC)   HTN (hypertension)   HLD (hyperlipidemia)   CAD (coronary artery disease)   Chronic systolic CHF (congestive heart failure) (HCC)   Anxiety   Infection of left earlobe  Total Time spent with patient: 20 minutes  Past Psychiatric History: Past history of schizophrenia and stroke  Past Medical History:  Past Medical History:  Diagnosis Date   Myocardial infarction (HCC)    Schizophrenia (HCC)    Stroke (HCC)     Past Surgical History:  Procedure Laterality Date   NO PAST SURGERIES     Family History:  Family History  Problem Relation Age of Onset   Hypertension Mother    Family Psychiatric  History: See previous Social History:  Social History   Substance and Sexual Activity  Alcohol Use Not Currently     Social History   Substance and Sexual Activity  Drug Use Not Currently    Social History   Socioeconomic History   Marital status: Single    Spouse name: Not on file   Number of children: Not on file   Years of education: Not on file   Highest education level: Not on file  Occupational History   Not on file  Tobacco Use   Smoking status: Never    Passive exposure: Never   Smokeless tobacco: Never  Vaping Use   Vaping Use: Unknown  Substance and Sexual Activity   Alcohol use: Not Currently   Drug use: Not Currently   Sexual activity: Not Currently  Other Topics Concern   Not on file  Social History Narrative   Not on file   Social Determinants of Health   Financial Resource Strain: Not on file  Food  Insecurity: Not on file  Transportation Needs: Not on file  Physical Activity: Not on file  Stress: Not on file  Social Connections: Not on file   Additional Social History:  Specify valuables returned:  (none)                      Sleep: Fair  Appetite:  Fair  Current Medications: Current Facility-Administered Medications  Medication Dose Route Frequency Provider Last Rate Last Admin   acetaminophen (TYLENOL) tablet 650 mg  650 mg Oral Q6H PRN Rivan Siordia T, MD   650 mg at 10/14/22 2328   alum & mag hydroxide-simeth (MAALOX/MYLANTA) 200-200-20 MG/5ML suspension 30 mL  30 mL Oral Q4H PRN Debralee Braaksma T, MD   30 mL at 04/26/22 7829   aspirin EC tablet 81 mg  81 mg Oral Daily Saksham Akkerman, [name], MD   81 mg at 11/10/22 0836   atorvastatin (LIPITOR) tablet 20 mg  20 mg Oral Daily Dariusz Brase T, MD   20 mg at 11/10/22 0835   atropine 1 % ophthalmic solution 1 drop  1 drop Sublingual QID [name], MD   1 drop at 11/10/22 1110   benztropine (COGENTIN) tablet 0.5 mg  0.5 mg Oral BID Laurren Lepkowski, [name], MD   0.5 mg at 11/10/22 0835   clonazePAM (KLONOPIN) tablet 1 mg  1 mg Oral TID PRN He, [name], MD  1 mg at 10/14/22 1105   cloZAPine (CLOZARIL) tablet 300 mg  300 mg Oral QHS Caysie Minnifield T, MD   300 mg at 11/09/22 2103   diphenhydrAMINE (BENADRYL) capsule 50 mg  50 mg Oral Q6H PRN Esau Fridman T, MD   50 mg at 09/30/22 2119   Or   diphenhydrAMINE (BENADRYL) injection 50 mg  50 mg Intramuscular Q6H PRN Guiliana Shor T, MD       feeding supplement (ENSURE ENLIVE / ENSURE PLUS) liquid 237 mL  237 mL Oral BID BM Moria Brophy T, MD   237 mL at 11/10/22 1333   gabapentin (NEURONTIN) capsule 300 mg  300 mg Oral TID Leisl Spurrier T, MD   300 mg at 11/10/22 1037   haloperidol (HALDOL) tablet 2 mg  2 mg Oral TID Ival Pacer T, MD   2 mg at 11/10/22 1037   haloperidol (HALDOL) tablet 5 mg  5 mg Oral Q6H PRN Kenedy Haisley T, MD   5 mg at 09/17/22 1532   Or   haloperidol lactate (HALDOL)  injection 5 mg  5 mg Intramuscular Q6H PRN Nakiea Metzner T, MD       lisinopril (ZESTRIL) tablet 5 mg  5 mg Oral Daily Jazzlynn Rawe T, MD   5 mg at 11/10/22 0835   magnesium hydroxide (MILK OF MAGNESIA) suspension 30 mL  30 mL Oral Daily PRN Maayan Jenning T, MD   30 mL at 11/03/22 0803   metoprolol succinate (TOPROL-XL) 24 hr tablet 25 mg  25 mg Oral Daily Chrisy Hillebrand T, MD   25 mg at 11/10/22 0836   ondansetron (ZOFRAN) tablet 4 mg  4 mg Oral Q8H PRN Lerry Liner M, NP   4 mg at 09/06/22 2320   paliperidone (INVEGA SUSTENNA) injection 156 mg  156 mg Intramuscular Q28 days Carlis Burnsworth T, MD   156 mg at 10/23/22 1715   paliperidone (INVEGA) 24 hr tablet 6 mg  6 mg Oral QHS Hosey Burmester T, MD   6 mg at 11/09/22 2102   senna-docusate (Senokot-S) tablet 2 tablet  2 tablet Oral QHS PRN Sarina Ill, DO   2 tablet at 09/15/22 2110   temazepam (RESTORIL) capsule 15 mg  15 mg Oral QHS Mavis Gravelle T, MD   15 mg at 11/09/22 2103   ziprasidone (GEODON) injection 20 mg  20 mg Intramuscular Q12H PRN Jamiesha Victoria, [name], MD   20 mg at 07/08/22 1453    Lab Results: No results found for this or any previous visit (from the past 48 hour(s)).  Blood Alcohol level:  Lab Results  Component Value Date   ETH <10 07/20/2021    Metabolic Disorder Labs: Lab Results  Component Value Date   HGBA1C 5.5 04/10/2022   MPG 111.15 04/10/2022   No results found for: "PROLACTIN" Lab Results  Component Value Date   CHOL 167 11/20/2021   TRIG 107 11/20/2021   HDL 26 (L) 11/20/2021   CHOLHDL 6.4 11/20/2021   VLDL 21 11/20/2021   LDLCALC 120 (H) 11/20/2021    Physical Findings: AIMS: Facial and Oral Movements Muscles of Facial Expression: None, normal Lips and Perioral Area: None, normal Jaw: None, normal Tongue: None, normal,Extremity Movements Upper (arms, wrists, hands, fingers): None, normal Lower (legs, knees, ankles, toes): None, normal, Trunk Movements Neck, shoulders, hips: None,  normal, Overall Severity Severity of abnormal movements (highest score from questions above): None, normal Incapacitation due to abnormal movements: None, normal Patient's awareness of abnormal movements (  rate only patient's report): No Awareness, Dental Status Current problems with teeth and/or dentures?: No Does patient usually wear dentures?: No  CIWA:    COWS:     Musculoskeletal: Strength & Muscle Tone: within normal limits Gait & Station: normal Patient leans: N/A  Psychiatric Specialty Exam:  Presentation  General Appearance:  Appropriate for Environment; Casual; Neat; Well Groomed  Eye Contact: Fair  Speech: Slow  Speech Volume: Decreased  Handedness: Left   Mood and Affect  Mood: Anxious  Affect: Congruent   Thought Process  Thought Processes: Goal Directed  Descriptions of Associations:Circumstantial  Orientation:Partial  Thought Content:Scattered  History of Schizophrenia/Schizoaffective disorder:Yes  Duration of Psychotic Symptoms:Greater than six months  Hallucinations:No data recorded Ideas of Reference:None  Suicidal Thoughts:No data recorded Homicidal Thoughts:No data recorded  Sensorium  Memory: Remote Good  Judgment: Fair  Insight: Fair   Art therapist  Concentration: Fair  Attention Span: Fair  Recall: [employer] of Knowledge: Fair  Language: Fair   Psychomotor Activity  Psychomotor Activity:No data recorded  Assets  Assets:No data recorded  Sleep  Sleep:No data recorded   Physical Exam: Physical Exam Vitals and nursing note reviewed.  Constitutional:      Appearance: Normal appearance.  HENT:     Head: Normocephalic and atraumatic.     Mouth/Throat:     Pharynx: Oropharynx is clear.  Eyes:     Pupils: Pupils are equal, round, and reactive to light.  Cardiovascular:     Rate and Rhythm: Normal rate and regular rhythm.  Pulmonary:     Effort: Pulmonary effort is normal.     Breath  sounds: Normal breath sounds.  Abdominal:     General: Abdomen is flat.     Palpations: Abdomen is soft.  Musculoskeletal:        General: Normal range of motion.  Skin:    General: Skin is warm and dry.  Neurological:     General: No focal deficit present.     Mental Status: He is alert. Mental status is at baseline.  Psychiatric:        Attention and Perception: He is inattentive.        Mood and Affect: Mood normal. Affect is blunt.        Speech: He is noncommunicative.    Review of Systems  Unable to perform ROS: Language   Blood pressure 114/71, pulse 81, temperature 98.7 F (37.1 C), temperature source Oral, resp. rate 15, height 5\' 8"  (1.727 m), weight 85.8 kg, SpO2 98 %. Body mass index is 28.76 kg/m.   Treatment Plan Summary: Plan no change to treatment plan.  Daily encouragement.  Continue to just work on discharge planning  , MD 11/10/2022, 3:03 PM

## 2022-11-10 NOTE — Plan of Care (Signed)
Pt denies anxiety/depression at this time. Pt denies SI/HI/AVH or pain at this time. Pt is calm and cooperative. Pt is medication compliant. Pt provided with support and encouragement. Pt monitored q15 minutes for safety per unit policy. Plan of care ongoing.   Problem: Coping: Goal: Ability to identify and develop effective coping behavior will improve 11/10/2022 0735 by Sharin Mons, RN Outcome: Progressing 11/09/2022 2100 by Sharin Mons, RN Outcome: Not Progressing   Problem: Nutrition: Goal: Adequate nutrition will be maintained Outcome: Progressing   Problem: Education: Goal: Ability to state activities that reduce stress will improve Outcome: Not Progressing

## 2022-11-11 LAB — CBC WITH DIFFERENTIAL/PLATELET
Abs Immature Granulocytes: 0.02 10*3/uL (ref 0.00–0.07)
Basophils Absolute: 0.1 10*3/uL (ref 0.0–0.1)
Basophils Relative: 2 %
Eosinophils Absolute: 0 10*3/uL (ref 0.0–0.5)
Eosinophils Relative: 0 %
HCT: 37.3 % — ABNORMAL LOW (ref 39.0–52.0)
Hemoglobin: 12.7 g/dL — ABNORMAL LOW (ref 13.0–17.0)
Immature Granulocytes: 0 %
Lymphocytes Relative: 18 %
Lymphs Abs: 0.8 10*3/uL (ref 0.7–4.0)
MCH: 30.8 pg (ref 26.0–34.0)
MCHC: 34 g/dL (ref 30.0–36.0)
MCV: 90.5 fL (ref 80.0–100.0)
Monocytes Absolute: 0.6 10*3/uL (ref 0.1–1.0)
Monocytes Relative: 13 %
Neutro Abs: 3.1 10*3/uL (ref 1.7–7.7)
Neutrophils Relative %: 67 %
Platelets: 153 10*3/uL (ref 150–400)
RBC: 4.12 MIL/uL — ABNORMAL LOW (ref 4.22–5.81)
RDW: 12.7 % (ref 11.5–15.5)
WBC: 4.6 10*3/uL (ref 4.0–10.5)
nRBC: 0 % (ref 0.0–0.2)

## 2022-11-11 NOTE — [facility] Group Notes (Signed)
[facility] Group Notes:  (Nursing/MHT/Case Management/Adjunct)  Date:  11/11/2022  Time:  9:09 AM  Type of Therapy:   Community Meeting  Participation Level:  Active  Participation Quality:  Appropriate and Attentive  Affect:  Appropriate  Cognitive:  Appropriate  Insight:  Appropriate  Engagement in Group:  Engaged  Modes of Intervention:  Discussion, Education, and Support  Summary of Progress/Problems:  Adam [name] Adam [name] 11/11/2022, 9:09 AM

## 2022-11-11 NOTE — Plan of Care (Signed)
Patient is visible in milieu for shift. Has frequent loud outbursts. Denies SI / HI /AVH.   Problem: Education: Goal: Ability to state activities that reduce stress will improve Outcome: Not Progressing   Problem: Coping: Goal: Ability to identify and develop effective coping behavior will improve Outcome: Not Progressing   Problem: Self-Concept: Goal: Ability to identify factors that promote anxiety will improve Outcome: Not Progressing

## 2022-11-11 NOTE — Progress Notes (Signed)
[facility] MD Progress Note  11/11/2022 1:32 PM Adam [name]  MRN:  [id number] Subjective: Follow-up patient with schizophrenia and language disorder.  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)   Stroke (HCC)   HTN (hypertension)   HLD (hyperlipidemia)   CAD (coronary artery disease)   Chronic systolic CHF (congestive heart failure) (HCC)   Anxiety   Infection of left earlobe  Total Time spent with patient: 30 minutes  Past Psychiatric History: Past history of schizophrenia  Past Medical History:  Past Medical History:  Diagnosis Date   Myocardial infarction (HCC)    Schizophrenia (HCC)    Stroke (HCC)     Past Surgical History:  Procedure Laterality Date   NO PAST SURGERIES     Family History:  Family History  Problem Relation Age of Onset   Hypertension Mother    Family Psychiatric  History: See previous Social History:  Social History   Substance and Sexual Activity  Alcohol Use Not Currently     Social History   Substance and Sexual Activity  Drug Use Not Currently    Social History   Socioeconomic History   Marital status: Single    Spouse name: Not on file   Number of children: Not on file   Years of education: Not on file   Highest education level: Not on file  Occupational History   Not on file  Tobacco Use   Smoking status: Never    Passive exposure: Never   Smokeless tobacco: Never  Vaping Use   Vaping Use: Unknown  Substance and Sexual Activity   Alcohol use: Not Currently   Drug use: Not Currently   Sexual activity: Not Currently  Other Topics Concern   Not on file  Social History Narrative   Not on file   Social Determinants of Health   Financial Resource  Strain: Not on file  Food Insecurity: Not on file  Transportation Needs: Not on file  Physical Activity: Not on file  Stress: Not on file  Social Connections: Not on file   Additional Social History:  Specify valuables returned:  (none)                      Sleep: Fair  Appetite:  Fair  Current Medications: Current Facility-Administered Medications  Medication Dose Route Frequency Provider Last Rate Last Admin   acetaminophen (TYLENOL) tablet 650 mg  650 mg Oral Q6H PRN Goble Fudala T, MD   650 mg at 10/14/22 2328   alum & mag hydroxide-simeth (MAALOX/MYLANTA) 200-200-20 MG/5ML suspension 30 mL  30 mL Oral Q4H PRN Tziporah Knoke T, MD   30 mL at 04/26/22 2449   aspirin EC tablet 81 mg  81 mg Oral Daily Nitesh Pitstick T, MD   81 mg at 11/11/22 0745   atorvastatin (LIPITOR) tablet 20 mg  20 mg Oral Daily Danyeal Akens T, MD   20 mg at 11/11/22 0745   atropine 1 % ophthalmic solution 1 drop  1 drop Sublingual QID [name], MD   1 drop at 11/11/22 1207   benztropine (COGENTIN) tablet 0.5 mg  0.5 mg Oral BID April Colter T, MD   0.5 mg at 11/11/22 0745   clonazePAM (KLONOPIN) tablet 1 mg  1 mg Oral TID PRN He,  [name], MD   1 mg at 10/14/22 1105   cloZAPine (CLOZARIL) tablet 300 mg  300 mg Oral QHS Santasia Rew, [name], MD   300 mg at 11/10/22 2122   diphenhydrAMINE (BENADRYL) capsule 50 mg  50 mg Oral Q6H PRN Damonte Frieson, [name], MD   50 mg at 09/30/22 2119   Or   diphenhydrAMINE (BENADRYL) injection 50 mg  50 mg Intramuscular Q6H PRN Darryon Bastin, [name], MD       feeding supplement (ENSURE ENLIVE / ENSURE PLUS) liquid 237 mL  237 mL Oral BID BM Keidan Aumiller T, MD   237 mL at 11/11/22 1305   gabapentin (NEURONTIN) capsule 300 mg  300 mg Oral TID Calli Bashor, [name], MD   300 mg at 11/11/22 1005   haloperidol (HALDOL) tablet 2 mg  2 mg Oral TID Lelani Garnett, [name], MD   2 mg at 11/11/22 1005   haloperidol (HALDOL) tablet 5 mg  5 mg Oral Q6H PRN Diontre Harps, [name], MD   5 mg at 09/17/22 1532   Or    haloperidol lactate (HALDOL) injection 5 mg  5 mg Intramuscular Q6H PRN Brytnee Bechler, [name], MD       lisinopril (ZESTRIL) tablet 5 mg  5 mg Oral Daily Shawni Volkov, [name], MD   5 mg at 11/11/22 0745   magnesium hydroxide (MILK OF MAGNESIA) suspension 30 mL  30 mL Oral Daily PRN Kayti Poss, [name], MD   30 mL at 11/03/22 0803   metoprolol succinate (TOPROL-XL) 24 hr tablet 25 mg  25 mg Oral Daily Marqus Macphee T, MD   25 mg at 11/11/22 0745   ondansetron (ZOFRAN) tablet 4 mg  4 mg Oral Q8H PRN Jearld Lesch, NP   4 mg at 09/06/22 2320   paliperidone (INVEGA SUSTENNA) injection 156 mg  156 mg Intramuscular Q28 days Alizia Greif, [name], MD   156 mg at 10/23/22 1715   paliperidone (INVEGA) 24 hr tablet 6 mg  6 mg Oral QHS Rishika Mccollom T, MD   6 mg at 11/10/22 2123   senna-docusate (Senokot-S) tablet 2 tablet  2 tablet Oral QHS PRN Sarina Ill, DO   2 tablet at 09/15/22 2110   temazepam (RESTORIL) capsule 15 mg  15 mg Oral QHS Anntonette Madewell T, MD   15 mg at 11/10/22 2122   ziprasidone (GEODON) injection 20 mg  20 mg Intramuscular Q12H PRN Temeca Somma, [name], MD   20 mg at 07/08/22 1453    Lab Results:  Results for orders placed or performed during the hospital encounter of 03/19/22 (from the past 48 hour(s))  CBC with Differential/Platelet     Status: Abnormal   Collection Time: 11/11/22 10:19 AM  Result Value Ref Range   WBC 4.6 4.0 - 10.5 K/uL   RBC 4.12 (L) 4.22 - 5.81 MIL/uL   Hemoglobin 12.7 (L) 13.0 - 17.0 g/dL   HCT 24.5 (L) 80.9 - 98.3 %   MCV 90.5 80.0 - 100.0 fL   MCH 30.8 26.0 - 34.0 pg   MCHC 34.0 30.0 - 36.0 g/dL   RDW 38.2 50.5 - 39.7 %   Platelets 153 150 - 400 K/uL   nRBC 0.0 0.0 - 0.2 %   Neutrophils Relative % 67 %   Neutro Abs 3.1 1.7 - 7.7 K/uL   Lymphocytes Relative 18 %   Lymphs Abs 0.8 0.7 - 4.0 K/uL   Monocytes Relative 13 %   Monocytes Absolute 0.6 0.1 - 1.0 K/uL  Eosinophils Relative 0 %   Eosinophils Absolute 0.0 0.0 - 0.5 K/uL   Basophils Relative 2 %    Basophils Absolute 0.1 0.0 - 0.1 K/uL   Immature Granulocytes 0 %   Abs Immature Granulocytes 0.02 0.00 - 0.07 K/uL    Comment: Performed at [facility], [address] [place], [place] [address]    Blood Alcohol level:  Lab Results  Component Value Date   [facility] <10 07/20/2021    Metabolic Disorder Labs: Lab Results  Component Value Date   HGBA1C 5.5 04/10/2022   MPG 111.15 04/10/2022   No results found for: "PROLACTIN" Lab Results  Component Value Date   CHOL 167 11/20/2021   TRIG 107 11/20/2021   HDL 26 (L) 11/20/2021   CHOLHDL 6.4 11/20/2021   VLDL 21 11/20/2021   LDLCALC 120 (H) 11/20/2021    Physical Findings: AIMS: Facial and Oral Movements Muscles of Facial Expression: None, normal Lips and Perioral Area: None, normal Jaw: None, normal Tongue: None, normal,Extremity Movements Upper (arms, wrists, hands, fingers): None, normal Lower (legs, knees, ankles, toes): None, normal, Trunk Movements Neck, shoulders, hips: None, normal, Overall Severity Severity of abnormal movements (highest score from questions above): None, normal Incapacitation due to abnormal movements: None, normal Patient's awareness of abnormal movements (rate only patient's report): No Awareness, Dental Status Current problems with teeth and/or dentures?: No Does patient usually wear dentures?: No  CIWA:    COWS:     Musculoskeletal: Strength & Muscle Tone: within normal limits Gait & Station: normal Patient leans: N/A  Psychiatric Specialty Exam:  Presentation  General Appearance:  Appropriate for Environment; Casual; Neat; Well Groomed  Eye Contact: Fair  Speech: Slow  Speech Volume: Decreased  Handedness: Left   Mood and Affect  Mood: Anxious  Affect: Congruent   Thought Process  Thought Processes: Goal Directed  Descriptions of Associations:Circumstantial  Orientation:Partial  Thought Content:Scattered  History of Schizophrenia/Schizoaffective  disorder:Yes  Duration of Psychotic Symptoms:Greater than six months  Hallucinations:No data recorded Ideas of Reference:None  Suicidal Thoughts:No data recorded Homicidal Thoughts:No data recorded  Sensorium  Memory: Remote Good  Judgment: Fair  Insight: Fair   Art therapist  Concentration: Fair  Attention Span: Fair  Recall: Fair  Fund of Knowledge: Fair  Language: Fair   Psychomotor Activity  Psychomotor Activity:No data recorded  Assets  Assets:No data recorded  Sleep  Sleep:No data recorded   Physical Exam: Physical Exam Vitals reviewed.  Constitutional:      Appearance: Normal appearance.  HENT:     Head: Normocephalic and atraumatic.     Mouth/Throat:     Pharynx: Oropharynx is clear.  Eyes:     Pupils: Pupils are equal, round, and reactive to light.  Cardiovascular:     Rate and Rhythm: Normal rate and regular rhythm.  Pulmonary:     Effort: Pulmonary effort is normal.     Breath sounds: Normal breath sounds.  Abdominal:     General: Abdomen is flat.     Palpations: Abdomen is soft.  Musculoskeletal:        General: Normal range of motion.  Skin:    General: Skin is warm and dry.  Neurological:     General: No focal deficit present.     Mental Status: He is alert. Mental status is at baseline.  Psychiatric:        Attention and Perception: He is inattentive.        Mood and Affect: Mood normal. Affect is blunt.  Speech: He is noncommunicative.    Review of Systems  Unable to perform ROS: Language   Blood pressure 108/68, pulse 80, temperature 98.7 F (37.1 C), temperature source Oral, resp. rate 15, height 5\' 8"  (1.727 m), weight 85.8 kg, SpO2 98 %. Body mass index is 28.76 kg/m.   Treatment Plan Summary: Medication management and Plan no change to treatment plan.  Continue supportive counseling and therapy on current medications and work on discharge planning  , MD 11/11/2022, 1:32 PM

## 2022-11-12 NOTE — Progress Notes (Signed)
[facility] MD Progress Note  11/12/2022 2:39 PM Adam [name]  MRN:  [id number] Subjective: Follow-up 61 year old man with schizophrenia.  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)   Stroke (HCC)   HTN (hypertension)   HLD (hyperlipidemia)   CAD (coronary artery disease)   Chronic systolic CHF (congestive heart failure) (HCC)   Anxiety   Infection of left earlobe  Total Time spent with patient: 20 minutes  Past Psychiatric History: History of schizophrenia and stroke  Past Medical History:  Past Medical History:  Diagnosis Date   Myocardial infarction (HCC)    Schizophrenia (HCC)    Stroke (HCC)     Past Surgical History:  Procedure Laterality Date   NO PAST SURGERIES     Family History:  Family History  Problem Relation Age of Onset   Hypertension Mother    Family Psychiatric  History: See previous Social History:  Social History   Substance and Sexual Activity  Alcohol Use Not Currently     Social History   Substance and Sexual Activity  Drug Use Not Currently    Social History   Socioeconomic History   Marital status: Single    Spouse name: Not on file   Number of children: Not on file   Years of education: Not on file   Highest education level: Not on file  Occupational History   Not on file  Tobacco Use   Smoking status: Never    Passive exposure: Never   Smokeless tobacco: Never  Vaping Use   Vaping Use: Unknown  Substance and Sexual Activity   Alcohol use: Not Currently   Drug use: Not Currently   Sexual activity: Not Currently  Other Topics Concern   Not on file  Social History Narrative   Not on file   Social Determinants of Health   Financial Resource Strain: Not  on file  Food Insecurity: Not on file  Transportation Needs: Not on file  Physical Activity: Not on file  Stress: Not on file  Social Connections: Not on file   Additional Social History:  Specify valuables returned:  (none)                      Sleep: Fair  Appetite:  Fair  Current Medications: Current Facility-Administered Medications  Medication Dose Route Frequency Provider Last Rate Last Admin   acetaminophen (TYLENOL) tablet 650 mg  650 mg Oral Q6H PRN Javonna Balli T, MD   650 mg at 10/14/22 2328   alum & mag hydroxide-simeth (MAALOX/MYLANTA) 200-200-20 MG/5ML suspension 30 mL  30 mL Oral Q4H PRN Genise Strack T, MD   30 mL at 04/26/22 4098   aspirin EC tablet 81 mg  81 mg Oral Daily Ayumi Wangerin, [name], MD   81 mg at 11/12/22 0904   atorvastatin (LIPITOR) tablet 20 mg  20 mg Oral Daily Evolet Salminen T, MD   20 mg at 11/12/22 0904   atropine 1 % ophthalmic solution 1 drop  1 drop Sublingual QID [name], MD   1 drop at 11/12/22 1421   benztropine (COGENTIN) tablet 0.5 mg  0.5 mg Oral BID Raylinn Kosar T, MD   0.5 mg at 11/12/22 0904   clonazePAM (KLONOPIN) tablet 1 mg  1 mg Oral TID PRN He,  [name], MD   1 mg at 10/14/22 1105   cloZAPine (CLOZARIL) tablet 300 mg  300 mg Oral QHS Crescentia Boutwell, [name], MD   300 mg at 11/11/22 2116   diphenhydrAMINE (BENADRYL) capsule 50 mg  50 mg Oral Q6H PRN Zaim Nitta, [name], MD   50 mg at 09/30/22 2119   Or   diphenhydrAMINE (BENADRYL) injection 50 mg  50 mg Intramuscular Q6H PRN Chanetta Moosman, [name], MD       feeding supplement (ENSURE ENLIVE / ENSURE PLUS) liquid 237 mL  237 mL Oral BID BM Lasean Gorniak T, MD   237 mL at 11/12/22 0911   gabapentin (NEURONTIN) capsule 300 mg  300 mg Oral TID Vollie Aaron, [name], MD   300 mg at 11/12/22 1421   haloperidol (HALDOL) tablet 2 mg  2 mg Oral TID Gordan Grell, [name], MD   2 mg at 11/12/22 1420   haloperidol (HALDOL) tablet 5 mg  5 mg Oral Q6H PRN Zinedine Ellner, [name], MD   5 mg at 11/11/22 2116   Or   haloperidol  lactate (HALDOL) injection 5 mg  5 mg Intramuscular Q6H PRN Jaleen Finch, [name], MD       lisinopril (ZESTRIL) tablet 5 mg  5 mg Oral Daily Jorje Vanatta, [name], MD   5 mg at 11/12/22 9629   magnesium hydroxide (MILK OF MAGNESIA) suspension 30 mL  30 mL Oral Daily PRN Jahiem Franzoni, [name], MD   30 mL at 11/03/22 0803   metoprolol succinate (TOPROL-XL) 24 hr tablet 25 mg  25 mg Oral Daily Rennae Ferraiolo T, MD   25 mg at 11/12/22 0900   ondansetron (ZOFRAN) tablet 4 mg  4 mg Oral Q8H PRN Jearld Lesch, NP   4 mg at 09/06/22 2320   paliperidone (INVEGA SUSTENNA) injection 156 mg  156 mg Intramuscular Q28 days Tykeria Wawrzyniak, [name], MD   156 mg at 10/23/22 1715   paliperidone (INVEGA) 24 hr tablet 6 mg  6 mg Oral QHS Jenne Sellinger T, MD   6 mg at 11/11/22 2118   senna-docusate (Senokot-S) tablet 2 tablet  2 tablet Oral QHS PRN Sarina Ill, DO   2 tablet at 09/15/22 2110   temazepam (RESTORIL) capsule 15 mg  15 mg Oral QHS Tonnette Zwiebel T, MD   15 mg at 11/11/22 2117   ziprasidone (GEODON) injection 20 mg  20 mg Intramuscular Q12H PRN Delphin Funes, [name], MD   20 mg at 07/08/22 1453    Lab Results:  Results for orders placed or performed during the hospital encounter of 03/19/22 (from the past 48 hour(s))  CBC with Differential/Platelet     Status: Abnormal   Collection Time: 11/11/22 10:19 AM  Result Value Ref Range   WBC 4.6 4.0 - 10.5 K/uL   RBC 4.12 (L) 4.22 - 5.81 MIL/uL   Hemoglobin 12.7 (L) 13.0 - 17.0 g/dL   HCT 52.8 (L) 41.3 - 24.4 %   MCV 90.5 80.0 - 100.0 fL   MCH 30.8 26.0 - 34.0 pg   MCHC 34.0 30.0 - 36.0 g/dL   RDW 01.0 27.2 - 53.6 %   Platelets 153 150 - 400 K/uL   nRBC 0.0 0.0 - 0.2 %   Neutrophils Relative % 67 %   Neutro Abs 3.1 1.7 - 7.7 K/uL   Lymphocytes Relative 18 %   Lymphs Abs 0.8 0.7 - 4.0 K/uL   Monocytes Relative 13 %   Monocytes Absolute 0.6 0.1 - 1.0 K/uL  Eosinophils Relative 0 %   Eosinophils Absolute 0.0 0.0 - 0.5 K/uL   Basophils Relative 2 %   Basophils  Absolute 0.1 0.0 - 0.1 K/uL   Immature Granulocytes 0 %   Abs Immature Granulocytes 0.02 0.00 - 0.07 K/uL    Comment: Performed at [facility], [address] [place], [place] [address]    Blood Alcohol level:  Lab Results  Component Value Date   [facility] <10 07/20/2021    Metabolic Disorder Labs: Lab Results  Component Value Date   HGBA1C 5.5 04/10/2022   MPG 111.15 04/10/2022   No results found for: "PROLACTIN" Lab Results  Component Value Date   CHOL 167 11/20/2021   TRIG 107 11/20/2021   HDL 26 (L) 11/20/2021   CHOLHDL 6.4 11/20/2021   VLDL 21 11/20/2021   LDLCALC 120 (H) 11/20/2021    Physical Findings: AIMS: Facial and Oral Movements Muscles of Facial Expression: None, normal Lips and Perioral Area: None, normal Jaw: None, normal Tongue: None, normal,Extremity Movements Upper (arms, wrists, hands, fingers): None, normal Lower (legs, knees, ankles, toes): None, normal, Trunk Movements Neck, shoulders, hips: None, normal, Overall Severity Severity of abnormal movements (highest score from questions above): None, normal Incapacitation due to abnormal movements: None, normal Patient's awareness of abnormal movements (rate only patient's report): No Awareness, Dental Status Current problems with teeth and/or dentures?: No Does patient usually wear dentures?: No  CIWA:    COWS:     Musculoskeletal: Strength & Muscle Tone: within normal limits Gait & Station: normal Patient leans: N/A  Psychiatric Specialty Exam:  Presentation  General Appearance:  Appropriate for Environment; Casual; Neat; Well Groomed  Eye Contact: Fair  Speech: Slow  Speech Volume: Decreased  Handedness: Left   Mood and Affect  Mood: Anxious  Affect: Congruent   Thought Process  Thought Processes: Goal Directed  Descriptions of Associations:Circumstantial  Orientation:Partial  Thought Content:Scattered  History of Schizophrenia/Schizoaffective  disorder:Yes  Duration of Psychotic Symptoms:Greater than six months  Hallucinations:No data recorded Ideas of Reference:None  Suicidal Thoughts:No data recorded Homicidal Thoughts:No data recorded  Sensorium  Memory: Remote Good  Judgment: Fair  Insight: Fair   Art therapist  Concentration: Fair  Attention Span: Fair  Recall: Fair  Fund of Knowledge: Fair  Language: Fair   Psychomotor Activity  Psychomotor Activity:No data recorded  Assets  Assets:No data recorded  Sleep  Sleep:No data recorded   Physical Exam: Physical Exam Vitals reviewed.  Constitutional:      Appearance: Normal appearance.  HENT:     Head: Normocephalic and atraumatic.     Mouth/Throat:     Pharynx: Oropharynx is clear.  Eyes:     Pupils: Pupils are equal, round, and reactive to light.  Cardiovascular:     Rate and Rhythm: Normal rate and regular rhythm.  Pulmonary:     Effort: Pulmonary effort is normal.     Breath sounds: Normal breath sounds.  Abdominal:     General: Abdomen is flat.     Palpations: Abdomen is soft.  Musculoskeletal:        General: Normal range of motion.  Skin:    General: Skin is warm and dry.  Neurological:     General: No focal deficit present.     Mental Status: He is alert. Mental status is at baseline.  Psychiatric:        Attention and Perception: He is inattentive.        Mood and Affect: Affect is blunt.  Speech: He is noncommunicative.    Review of Systems  Unable to perform ROS: Language   Blood pressure 104/77, pulse (!) 101, temperature 97.8 F (36.6 C), temperature source Oral, resp. rate 20, height 5\' 8"  (1.727 m), weight 85.8 kg, SpO2 98 %. Body mass index is 28.76 kg/m.   Treatment Plan Summary: Medication management and Plan no change to medication management.  Encouragement supportive communication all completed.  Continue to wait just on placement options  Alethia Berthold, MD 11/12/2022, 2:39 PM

## 2022-11-12 NOTE — Group Note (Signed)
[facility] LCSW Group Therapy Note    Group Date: 11/12/2022 Start Time: 1300 End Time: 1400  Type of Therapy and Topic:  Group Therapy:  Overcoming Obstacles  Participation Level:  [facility] PARTICIPATION LEVEL: Did Not Attend  Description of Group:   In this group patients will be encouraged to explore what they see as obstacles to their own wellness and recovery. They will be guided to discuss their thoughts, feelings, and behaviors related to these obstacles. The group will process together ways to cope with barriers, with attention given to specific choices patients can make. Each patient will be challenged to identify changes they are motivated to make in order to overcome their obstacles. This group will be process-oriented, with patients participating in exploration of their own experiences as well as giving and receiving support and challenge from other group members.  Therapeutic Goals: 1. Patient will identify personal and current obstacles as they relate to admission. 2. Patient will identify barriers that currently interfere with their wellness or overcoming obstacles.  3. Patient will identify feelings, thought process and behaviors related to these barriers. 4. Patient will identify two changes they are willing to make to overcome these obstacles:    Summary of Patient Progress Pt declined to attend group despite invitation.    Therapeutic Modalities:   Cognitive Behavioral Therapy Solution Focused Therapy Motivational Interviewing Relapse Prevention Therapy   Jalicia Roszak R Ameenah Prosser, LCSW 

## 2022-11-12 NOTE — Plan of Care (Signed)
D: Patient alert and oriented. Patient denies pain. Patient denies anxiety and depression. Patient denies SI/HI/AVH. Patient has been visible around the milieu during shift. Patient frequently on unit yelling and singing.  A: Scheduled medications administered to patient, per MD orders.  Support and encouragement provided to patient.  Q15 minute safety checks maintained.   R: Patient compliant with medication administration and treatment plan. No adverse drug reactions noted. Patient remains safe on the unit at this time. Problem: Education: Goal: Knowledge of General Education information will improve Description: Including pain rating scale, medication(s)/side effects and non-pharmacologic comfort measures Outcome: Progressing   Problem: Clinical Measurements: Goal: Ability to maintain clinical measurements within normal limits will improve Outcome: Progressing   Problem: Nutrition: Goal: Adequate nutrition will be maintained Outcome: Progressing   Problem: Safety: Goal: Ability to remain free from injury will improve Outcome: Progressing

## 2022-11-12 NOTE — Progress Notes (Signed)
Adam [name] was pleasant and cooperative.  Received his meds without issue. He denies si  hi  avh and anxiety when asked.  Endorses depression, but can not express a number.  Active on the unit in the dayroom and out in the halls pacing around. Will continue to monitor with q 15 minute safety checks.   C Butler-Nicholson,LPN

## 2022-11-12 NOTE — Progress Notes (Signed)
Recreation Therapy Notes    Date: 11/12/2022   Time: 10:15 am      Location: Craft room    Behavioral response: N/A   Intervention Topic: Strengths    Discussion/Intervention: Patient refused to attend group.    Clinical Observations/Feedback:  Patient refused to attend group.    Saskia Simerson LRT/CTRS          Devin Ganaway 11/12/2022 11:41 AM 

## 2022-11-12 NOTE — [facility] Group Notes (Signed)
[facility] Group Notes:  (Nursing/MHT/Case Management/Adjunct)  Date:  11/12/2022  Time:  8:47 PM  Type of Therapy:   Wrap up  Participation Level:  Active  Participation Quality:  Appropriate  Affect:  Appropriate  Cognitive:  Alert  Insight:  Good  Engagement in Group:  Engaged and he only say his name and when ask if he had any goal he said no.  Modes of Intervention:  Support  Summary of Progress/Problems:  Adam [name] 11/12/2022, 8:47 PM

## 2022-11-13 NOTE — Group Note (Signed)
LCSW Group Therapy Note   Group Date: 11/13/2022 Start Time: 1300 End Time: 1400   Type of Therapy and Topic:  Group Therapy: Challenging Core Beliefs  Participation Level:  Minimal  Description of Group:  Patients were educated about core beliefs and asked to identify one harmful core belief that they have. Patients were asked to explore from where those beliefs originate. Patients were asked to discuss how those beliefs make them feel and the resulting behaviors of those beliefs. They were then be asked if those beliefs are true and, if so, what evidence they have to support them. Lastly, group members were challenged to replace those negative core beliefs with helpful beliefs.   Therapeutic Goals:   1. Patient will identify harmful core beliefs and explore the origins of such beliefs. 2. Patient will identify feelings and behaviors that result from those core beliefs. 3. Patient will discuss whether such beliefs are true. 4.  Patient will replace harmful core beliefs with helpful ones.  Summary of Patient Progress:  Patient was present for the entirety of group session. Patient participated in opening and closing remarks. However, patient did not contribute at all to the topic of discussion despite encouraged participation.   Therapeutic Modalities: Cognitive Behavioral Therapy; Solution-Focused Therapy   Almedia Balls 11/13/2022  2:33 PM

## 2022-11-13 NOTE — Progress Notes (Signed)
Recreation Therapy Notes   Date: 11/13/2022  Time: 10:00 am     Location: Craft room   Behavioral response: Appropriate  Intervention Topic:  Self-esteem   Discussion/Intervention:  Group content today was focused on self-esteem. Patient defined self-esteem and where it comes form. The group described reasons self-esteem is important. Individuals stated things that impact self-esteem and positive ways to improve self-esteem. Patient went over the components of self-esteem. The group participated in the intervention where patients were able to identify positive ways to improve their self-esteem.  Clinical Observations/Feedback: Patient came to group late due to unknown reasons. When asked how he would describes himself he stated special. Patient then went on to express that he has a cousin name Darl Pikes. Individual was social with peers and staff while participating in the intervention.    Keeghan Bialy LRT/CTRS         Tonyia Marschall 11/13/2022 11:42 AM

## 2022-11-13 NOTE — Progress Notes (Signed)
Patient irritable and yelling out this morning when his requests are denied. But patient calm and cooperative rest of the shift. Compliant with medications. Denies SI,HI and AVH. Appetite and energy level good. Support and encouragement given.

## 2022-11-13 NOTE — Plan of Care (Signed)
Pt is mildly demanding, but otherwise at baseline, calm and cooperative most of shift; denies pain, SI AVH, HI.  Pt is compliant with medications.  Pt was awake part of night but with no complaints.  Continued monitoring via q 15 m checks.

## 2022-11-13 NOTE — Progress Notes (Signed)
[facility] MD Progress Note  11/13/2022 4:50 PM Adam [name]  MRN:  [id number] Subjective: Follow-up patient with schizophrenia.  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)   Stroke (HCC)   HTN (hypertension)   HLD (hyperlipidemia)   CAD (coronary artery disease)   Chronic systolic CHF (congestive heart failure) (HCC)   Anxiety   Infection of left earlobe  Total Time spent with patient: 15 minutes  Past Psychiatric History: Past history of schizophrenia  Past Medical History:  Past Medical History:  Diagnosis Date   Myocardial infarction (HCC)    Schizophrenia (HCC)    Stroke (HCC)     Past Surgical History:  Procedure Laterality Date   NO PAST SURGERIES     Family History:  Family History  Problem Relation Age of Onset   Hypertension Mother    Family Psychiatric  History: See previous Social History:  Social History   Substance and Sexual Activity  Alcohol Use Not Currently     Social History   Substance and Sexual Activity  Drug Use Not Currently    Social History   Socioeconomic History   Marital status: Single    Spouse name: Not on file   Number of children: Not on file   Years of education: Not on file   Highest education level: Not on file  Occupational History   Not on file  Tobacco Use   Smoking status: Never    Passive exposure: Never   Smokeless tobacco: Never  Vaping Use   Vaping Use: Unknown  Substance and Sexual Activity   Alcohol use: Not Currently   Drug use: Not Currently   Sexual activity: Not Currently  Other Topics Concern   Not on file  Social History Narrative   Not on file   Social Determinants of Health   Financial Resource Strain: Not on file  Food Insecurity:  Not on file  Transportation Needs: Not on file  Physical Activity: Not on file  Stress: Not on file  Social Connections: Not on file   Additional Social History:  Specify valuables returned:  (none)                      Sleep: Fair  Appetite:  Fair  Current Medications: Current Facility-Administered Medications  Medication Dose Route Frequency Provider Last Rate Last Admin   acetaminophen (TYLENOL) tablet 650 mg  650 mg Oral Q6H PRN Wilhemenia Camba T, MD   650 mg at 10/14/22 2328   alum & mag hydroxide-simeth (MAALOX/MYLANTA) 200-200-20 MG/5ML suspension 30 mL  30 mL Oral Q4H PRN Tatiyanna Lashley T, MD   30 mL at 04/26/22 2440   aspirin EC tablet 81 mg  81 mg Oral Daily Donice Alperin, [name], MD   81 mg at 11/13/22 1027   atorvastatin (LIPITOR) tablet 20 mg  20 mg Oral Daily Tamani Durney T, MD   20 mg at 11/13/22 0807   atropine 1 % ophthalmic solution 1 drop  1 drop Sublingual QID [name], MD   1 drop at 11/13/22 1645   benztropine (COGENTIN) tablet 0.5 mg  0.5 mg Oral BID Angles Trevizo T, MD   0.5 mg at 11/13/22 1645   clonazePAM (KLONOPIN) tablet 1 mg  1 mg Oral TID PRN He, [name], MD   1  mg at 11/13/22 6945   cloZAPine (CLOZARIL) tablet 300 mg  300 mg Oral QHS Yamili Lichtenwalner T, MD   300 mg at 11/12/22 2112   diphenhydrAMINE (BENADRYL) capsule 50 mg  50 mg Oral Q6H PRN Maijor Hornig T, MD   50 mg at 09/30/22 2119   Or   diphenhydrAMINE (BENADRYL) injection 50 mg  50 mg Intramuscular Q6H PRN Amberleigh Gerken T, MD       feeding supplement (ENSURE ENLIVE / ENSURE PLUS) liquid 237 mL  237 mL Oral BID BM Willow Shidler T, MD   237 mL at 11/13/22 1430   gabapentin (NEURONTIN) capsule 300 mg  300 mg Oral TID Valery Amedee T, MD   300 mg at 11/13/22 1430   haloperidol (HALDOL) tablet 2 mg  2 mg Oral TID Kathleen Likins T, MD   2 mg at 11/13/22 1430   haloperidol (HALDOL) tablet 5 mg  5 mg Oral Q6H PRN Arial Galligan T, MD   5 mg at 11/11/22 2116   Or   haloperidol lactate (HALDOL) injection 5  mg  5 mg Intramuscular Q6H PRN Athol Bolds T, MD       lisinopril (ZESTRIL) tablet 5 mg  5 mg Oral Daily Sandro Burgo T, MD   5 mg at 11/13/22 0388   magnesium hydroxide (MILK OF MAGNESIA) suspension 30 mL  30 mL Oral Daily PRN Savannah Morford T, MD   30 mL at 11/03/22 0803   metoprolol succinate (TOPROL-XL) 24 hr tablet 25 mg  25 mg Oral Daily Aharon Carriere T, MD   25 mg at 11/13/22 0807   ondansetron (ZOFRAN) tablet 4 mg  4 mg Oral Q8H PRN Lerry Liner M, NP   4 mg at 09/06/22 2320   paliperidone (INVEGA SUSTENNA) injection 156 mg  156 mg Intramuscular Q28 days Kelbi Renstrom T, MD   156 mg at 10/23/22 1715   paliperidone (INVEGA) 24 hr tablet 6 mg  6 mg Oral QHS Hedi Barkan T, MD   6 mg at 11/12/22 2113   senna-docusate (Senokot-S) tablet 2 tablet  2 tablet Oral QHS PRN Sarina Ill, DO   2 tablet at 09/15/22 2110   temazepam (RESTORIL) capsule 15 mg  15 mg Oral QHS Gennette Shadix T, MD   15 mg at 11/12/22 2112   ziprasidone (GEODON) injection 20 mg  20 mg Intramuscular Q12H PRN Zaquan Duffner, [name], MD   20 mg at 07/08/22 1453    Lab Results: No results found for this or any previous visit (from the past 48 hour(s)).  Blood Alcohol level:  Lab Results  Component Value Date   ETH <10 07/20/2021    Metabolic Disorder Labs: Lab Results  Component Value Date   HGBA1C 5.5 04/10/2022   MPG 111.15 04/10/2022   No results found for: "PROLACTIN" Lab Results  Component Value Date   CHOL 167 11/20/2021   TRIG 107 11/20/2021   HDL 26 (L) 11/20/2021   CHOLHDL 6.4 11/20/2021   VLDL 21 11/20/2021   LDLCALC 120 (H) 11/20/2021    Physical Findings: AIMS: Facial and Oral Movements Muscles of Facial Expression: None, normal Lips and Perioral Area: None, normal Jaw: None, normal Tongue: None, normal,Extremity Movements Upper (arms, wrists, hands, fingers): None, normal Lower (legs, knees, ankles, toes): None, normal, Trunk Movements Neck, shoulders, hips: None, normal, Overall  Severity Severity of abnormal movements (highest score from questions above): None, normal Incapacitation due to abnormal movements: None, normal Patient's awareness of abnormal movements (rate  only patient's report): No Awareness, Dental Status Current problems with teeth and/or dentures?: No Does patient usually wear dentures?: No  CIWA:    COWS:     Musculoskeletal: Strength & Muscle Tone: within normal limits Gait & Station: normal Patient leans: N/A  Psychiatric Specialty Exam:  Presentation  General Appearance:  Appropriate for Environment; Casual; Neat; Well Groomed  Eye Contact: Fair  Speech: Slow  Speech Volume: Decreased  Handedness: Left   Mood and Affect  Mood: Anxious  Affect: Congruent   Thought Process  Thought Processes: Goal Directed  Descriptions of Associations:Circumstantial  Orientation:Partial  Thought Content:Scattered  History of Schizophrenia/Schizoaffective disorder:Yes  Duration of Psychotic Symptoms:Greater than six months  Hallucinations:No data recorded Ideas of Reference:None  Suicidal Thoughts:No data recorded Homicidal Thoughts:No data recorded  Sensorium  Memory: Remote Good  Judgment: Fair  Insight: Fair   Community education officer  Concentration: Fair  Attention Span: Fair  Recall: [employer] of Knowledge: Fair  Language: Fair   Psychomotor Activity  Psychomotor Activity:No data recorded  Assets  Assets:No data recorded  Sleep  Sleep:No data recorded   Physical Exam: Physical Exam Vitals and nursing note reviewed.  Constitutional:      Appearance: Normal appearance.  HENT:     Head: Normocephalic and atraumatic.     Mouth/Throat:     Pharynx: Oropharynx is clear.  Eyes:     Pupils: Pupils are equal, round, and reactive to light.  Cardiovascular:     Rate and Rhythm: Normal rate and regular rhythm.  Pulmonary:     Effort: Pulmonary effort is normal.     Breath sounds: Normal  breath sounds.  Abdominal:     General: Abdomen is flat.     Palpations: Abdomen is soft.  Musculoskeletal:        General: Normal range of motion.  Skin:    General: Skin is warm and dry.  Neurological:     General: No focal deficit present.     Mental Status: He is alert. Mental status is at baseline.  Psychiatric:        Attention and Perception: He is inattentive.        Mood and Affect: Mood normal. Affect is blunt.        Speech: He is noncommunicative. Speech is delayed.    Review of Systems  Unable to perform ROS: Language   Blood pressure 116/87, pulse 94, temperature 98.6 F (37 C), temperature source Oral, resp. rate 20, height 5\' 8"  (1.727 m), weight 85.8 kg, SpO2 100 %. Body mass index is 28.76 kg/m.   Treatment Plan Summary: Plan no change.  Completely stable situation waiting for placement  Alethia Berthold, MD 11/13/2022, 4:50 PM

## 2022-11-14 NOTE — Plan of Care (Signed)
  Problem: Education: Goal: Ability to state activities that reduce stress will improve Outcome: Progressing   Problem: Self-Concept: Goal: Ability to identify factors that promote anxiety will improve Outcome: Progressing Goal: Level of anxiety will decrease Outcome: Progressing Goal: Ability to modify response to factors that promote anxiety will improve Outcome: Progressing   Problem: Education: Goal: Knowledge of General Education information will improve Description: Including pain rating scale, medication(s)/side effects and non-pharmacologic comfort measures Outcome: Progressing   Problem: Education: Goal: Will be free of psychotic symptoms Outcome: Progressing   Problem: Coping: Goal: Coping ability will improve Outcome: Progressing   Problem: Activity: Goal: Interest or engagement in leisure activities will improve Outcome: Progressing   Problem: Skin Integrity: Goal: Risk for impaired skin integrity will decrease Outcome: Progressing   Problem: Safety: Goal: Ability to remain free from injury will improve Outcome: Progressing   

## 2022-11-14 NOTE — [facility] Counselor (Signed)
CSW was able to confirm that Thayer Ohm with [facility] has received the requested material.   He reports that he will follow up with the CSW team on 11/15/2022 to schedule a face to face visit on 11/20/2022.  CSW has provided contact information for the CSW team.  [name], MSW, LCSW 11/14/2022 4:21 PM

## 2022-11-14 NOTE — [facility] Counselor (Signed)
CSW has sent the requested information to [facility]. At [facility].  Information sent to include FL2, PPD results and notes for the last three days.  [name], MSW, LCSW 11/14/2022 9:11 AM

## 2022-11-14 NOTE — Group Note (Signed)
[facility] LCSW Group Therapy Note   Group Date: 11/14/2022 Start Time: 1300 End Time: 1400   Type of Therapy/Topic:  Group Therapy:  Emotion Regulation  Participation Level:  Did Not Attend   Mood:  Description of Group:    The purpose of this group is to assist patients in learning to regulate negative emotions and experience positive emotions. Patients will be guided to discuss ways in which they have been vulnerable to their negative emotions. These vulnerabilities will be juxtaposed with experiences of positive emotions or situations, and patients challenged to use positive emotions to combat negative ones. Special emphasis will be placed on coping with negative emotions in conflict situations, and patients will process healthy conflict resolution skills.  Therapeutic Goals: Patient will identify two positive emotions or experiences to reflect on in order to balance out negative emotions:  Patient will label two or more emotions that they find the most difficult to experience:  Patient will be able to demonstrate positive conflict resolution skills through discussion or role plays:   Summary of Patient Progress:   Patient did not attend group.    Therapeutic Modalities:   Cognitive Behavioral Therapy Feelings Identification Dialectical Behavioral Therapy   Raylynn Hersh J Areal Cochrane, LCSW 

## 2022-11-14 NOTE — Progress Notes (Signed)
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)   Stroke (HCC)   HTN (hypertension)   HLD (hyperlipidemia)   CAD (coronary artery disease)   Chronic systolic CHF (congestive heart failure) (HCC)   Anxiety   Infection of left earlobe  Total Time spent with patient: 20 minutes  Past Psychiatric History: schizophrenia  Past Medical History:  Past Medical History:  Diagnosis Date   Myocardial infarction (HCC)    Schizophrenia (HCC)    Stroke (HCC)     Past Surgical History:  Procedure Laterality Date   NO PAST SURGERIES     Family History:  Family History  Problem Relation Age of Onset   Hypertension Mother    Family Psychiatric  History: none Social History:  Social History   Substance and Sexual Activity  Alcohol Use Not Currently     Social History   Substance and Sexual Activity  Drug Use Not Currently    Social History   Socioeconomic History   Marital status: Single    Spouse name: Not on file   Number of children: Not on file   Years of education: Not on file   Highest education level: Not on file  Occupational History   Not on file  Tobacco Use   Smoking status: Never    Passive exposure: Never   Smokeless tobacco: Never  Vaping Use   Vaping Use: Unknown  Substance and Sexual Activity   Alcohol use: Not Currently   Drug use: Not Currently   Sexual activity: Not Currently  Other Topics Concern   Not on file  Social History Narrative   Not on file   Social Determinants of Health   Financial Resource Strain: Not on file  Food Insecurity: Not on file  Transportation Needs: Not on file  Physical Activity: Not on file  Stress: Not  on file  Social Connections: Not on file   Additional Social History:  Specify valuables returned:  (none)                      Sleep: Fair  Appetite:  Fair  Current Medications: Current Facility-Administered Medications  Medication Dose Route Frequency Provider Last Rate Last Admin   acetaminophen (TYLENOL) tablet 650 mg  650 mg Oral Q6H PRN Aaliyha Mumford T, MD   650 mg at 10/14/22 2328   alum & mag hydroxide-simeth (MAALOX/MYLANTA) 200-200-20 MG/5ML suspension 30 mL  30 mL Oral Q4H PRN Ashwika Freels T, MD   30 mL at 04/26/22 6270   aspirin EC tablet 81 mg  81 mg Oral Daily Pinkie Manger, [name], MD   81 mg at 11/14/22 0853   atorvastatin (LIPITOR) tablet 20 mg  20 mg Oral Daily Bionca Mckey T, MD   20 mg at 11/14/22 0853   atropine 1 % ophthalmic solution 1 drop  1 drop Sublingual QID [name], MD   1 drop at 11/14/22 1600   benztropine (COGENTIN) tablet 0.5 mg  0.5 mg Oral BID Shellia Hartl T, MD   0.5 mg at 11/14/22 1600   clonazePAM (KLONOPIN) tablet 1 mg  1 mg Oral TID PRN He, [name], MD   1 mg at 11/13/22 2004   cloZAPine (CLOZARIL) tablet 300 mg  300 mg Oral QHS Jenan Ellegood T, MD   300 mg at 11/13/22 2003   diphenhydrAMINE (BENADRYL) capsule 50 mg  50 mg Oral Q6H PRN Musab Wingard T, MD   50 mg at 09/30/22 2119   Or   diphenhydrAMINE (BENADRYL) injection 50 mg  50 mg Intramuscular Q6H PRN Jasmon Graffam T, MD       feeding supplement (ENSURE ENLIVE / ENSURE PLUS) liquid 237 mL  237 mL Oral BID BM Beautiful Pensyl T, MD   237 mL at 11/14/22 1423   gabapentin (NEURONTIN) capsule 300 mg  300 mg Oral TID Clarissia Mckeen T, MD   300 mg at 11/14/22 1522   haloperidol (HALDOL) tablet 2 mg  2 mg Oral TID Zolton Dowson T, MD   2 mg at 11/14/22 1522   haloperidol (HALDOL) tablet 5 mg  5 mg Oral Q6H PRN Lavere Shinsky T, MD   5 mg at 11/11/22 2116   Or   haloperidol lactate (HALDOL) injection 5 mg  5 mg Intramuscular Q6H PRN Jaylene Schrom T, MD       lisinopril (ZESTRIL) tablet 5 mg  5 mg  Oral Daily Jerrel Tiberio T, MD   5 mg at 11/14/22 2025   magnesium hydroxide (MILK OF MAGNESIA) suspension 30 mL  30 mL Oral Daily PRN Tanaya Dunigan T, MD   30 mL at 11/03/22 0803   metoprolol succinate (TOPROL-XL) 24 hr tablet 25 mg  25 mg Oral Daily Nickoli Bagheri T, MD   25 mg at 11/14/22 0853   ondansetron (ZOFRAN) tablet 4 mg  4 mg Oral Q8H PRN Lerry Liner M, NP   4 mg at 09/06/22 2320   paliperidone (INVEGA SUSTENNA) injection 156 mg  156 mg Intramuscular Q28 days Keriann Rankin T, MD   156 mg at 10/23/22 1715   paliperidone (INVEGA) 24 hr tablet 6 mg  6 mg Oral QHS Vlasta Baskin T, MD   6 mg at 11/13/22 2004   senna-docusate (Senokot-S) tablet 2 tablet  2 tablet Oral QHS PRN Sarina Ill, DO   2 tablet at 09/15/22 2110   temazepam (RESTORIL) capsule 15 mg  15 mg Oral QHS Abbigael Detlefsen T, MD   15 mg at 11/13/22 2004   ziprasidone (GEODON) injection 20 mg  20 mg Intramuscular Q12H PRN Egan Berkheimer, [name], MD   20 mg at 07/08/22 1453    Lab Results: No results found for this or any previous visit (from the past 48 hour(s)).  Blood Alcohol level:  Lab Results  Component Value Date   ETH <10 07/20/2021    Metabolic Disorder Labs: Lab Results  Component Value Date   HGBA1C 5.5 04/10/2022   MPG 111.15 04/10/2022   No results found for: "PROLACTIN" Lab Results  Component Value Date   CHOL 167 11/20/2021   TRIG 107 11/20/2021   HDL 26 (L) 11/20/2021   CHOLHDL 6.4 11/20/2021   VLDL 21 11/20/2021   LDLCALC 120 (H) 11/20/2021    Physical Findings: AIMS: Facial and Oral Movements Muscles of Facial Expression: None, normal Lips and Perioral Area: None, normal Jaw: None, normal Tongue: None, normal,Extremity Movements Upper (arms, wrists, hands, fingers): None, normal Lower (legs, knees, ankles, toes): None, normal, Trunk Movements Neck, shoulders, hips: None, normal, Overall Severity Severity of abnormal movements (highest score from questions above): None,  normal Incapacitation due to abnormal movements: None, normal Patient's awareness of abnormal movements (rate only patient's report): No Awareness, Dental Status Current problems with teeth and/or  dentures?: No Does patient usually wear dentures?: No  CIWA:    COWS:     Musculoskeletal: Strength & Muscle Tone: within normal limits Gait & Station: normal Patient leans: N/A  Psychiatric Specialty Exam:  Presentation  General Appearance:  Appropriate for Environment; Casual; Neat; Well Groomed  Eye Contact: Fair  Speech: Slow  Speech Volume: Decreased  Handedness: Left   Mood and Affect  Mood: Anxious  Affect: Congruent   Thought Process  Thought Processes: Goal Directed  Descriptions of Associations:Circumstantial  Orientation:Partial  Thought Content:Scattered  History of Schizophrenia/Schizoaffective disorder:Yes  Duration of Psychotic Symptoms:Greater than six months  Hallucinations:No data recorded Ideas of Reference:None  Suicidal Thoughts:No data recorded Homicidal Thoughts:No data recorded  Sensorium  Memory: Remote Good  Judgment: Fair  Insight: Fair   Art therapist  Concentration: Fair  Attention Span: Fair  Recall: [employer] of Knowledge: Fair  Language: Fair   Psychomotor Activity  Psychomotor Activity:No data recorded  Assets  Assets:No data recorded  Sleep  Sleep:No data recorded   Physical Exam: Physical Exam Vitals and nursing note reviewed.  Constitutional:      Appearance: Normal appearance.  HENT:     Head: Normocephalic and atraumatic.     Mouth/Throat:     Pharynx: Oropharynx is clear.  Eyes:     Pupils: Pupils are equal, round, and reactive to light.  Cardiovascular:     Rate and Rhythm: Normal rate and regular rhythm.  Pulmonary:     Effort: Pulmonary effort is normal.     Breath sounds: Normal breath sounds.  Abdominal:     General: Abdomen is flat.     Palpations: Abdomen  is soft.  Musculoskeletal:        General: Normal range of motion.  Skin:    General: Skin is warm and dry.  Neurological:     General: No focal deficit present.     Mental Status: He is alert. Mental status is at baseline.  Psychiatric:        Attention and Perception: He is inattentive.        Mood and Affect: Mood normal. Affect is blunt.        Speech: He is noncommunicative.    Review of Systems  Unable to perform ROS: Language   Blood pressure 114/77, pulse 80, temperature (!) 97.5 F (36.4 C), temperature source Oral, resp. rate 20, height 5\' 8"  (1.727 m), weight 85.8 kg, SpO2 98 %. Body mass index is 28.76 kg/m.   Treatment Plan Summary: Plan no change to treatment plan. Patient is stable and ready for discharge  , MD 11/14/2022, 4:04 PM

## 2022-11-14 NOTE — [facility] Counselor (Signed)
CSW team attended weekly meeting for placement for patient.   CSW arrived late to the meeting.  CSW updated that requested documents have been sent to [facility]. CSW updated that team is awaiting confirmation of the receipt of the documents and team will continue to follow up as appropriate.  Placement team report that they will follow up with the previous possible placement that [facility] has been able to identify and work with.  [name], MSW, LCSW 11/14/2022 10:59 AM

## 2022-11-14 NOTE — [facility] Group Notes (Signed)
[facility] Group Notes:  (Nursing/MHT/Case Management/Adjunct)  Date:  11/14/2022  Time:  8:27 PM  Type of Therapy:   Wrap up  Participation Level:  Did Not Attend  Summary of Progress/Problems:  Mayra Neer 11/14/2022, 8:27 PM

## 2022-11-14 NOTE — Progress Notes (Signed)
Recreation Therapy Notes   Date: 11/14/2022  Time: 10:35 am    Location: Craft room     Behavioral response: N/A   Intervention Topic: Values   Discussion/Intervention: Patient refused to attend group.   Clinical Observations/Feedback:  Patient refused to attend group.    Elzia Hott LRT/CTRS        Adam [name] 11/14/2022 12:03 PM

## 2022-11-14 NOTE — Progress Notes (Signed)
Patient was agitated and demanding on shift initially. He was constantly at the nurse's station with various request. Current writer gave the patient his medication early and a PRN was added with his night medication to assist patient with their agitation.  He is currently in the bed resting at this time.

## 2022-11-15 LAB — RESPIRATORY PANEL BY PCR

## 2022-11-15 NOTE — Plan of Care (Signed)
D: Patient alert and oriented. Patient denies anxiety and depression. Patient denies SI/HI/AVH. Patient has been isolative and sleeping in room all day. It was reported by previous shift that patient had a temperature of 100 degrees. When vitals were rechecked during shift patient vitals were WNL. Patient did not get up for lunch or dinner. Patient endorses pain pointing all over his body and requested tylenol. PRN tylenol provided to patient. Patient has remained isolative to room.   A: Scheduled medications administered to patient, per MD orders.  Support and encouragement provided to patient.  Q15 minute safety checks maintained.   R: Patient compliant with medication administration and treatment plan. No adverse drug reactions noted. Patient remains safe on the unit at this time. Problem: Education: Goal: Knowledge of General Education information will improve Description: Including pain rating scale, medication(s)/side effects and non-pharmacologic comfort measures Outcome: Progressing   Problem: Clinical Measurements: Goal: Ability to maintain clinical measurements within normal limits will improve Outcome: Progressing   Problem: Activity: Goal: Interest or engagement in leisure activities will improve Outcome: Progressing   Problem: Education: Goal: Will be free of psychotic symptoms Outcome: Progressing

## 2022-11-15 NOTE — Progress Notes (Signed)
Recreation Therapy Notes    Date: 11/15/2022   Time: 09:45 am     Location: Craft room      Behavioral response: N/A   Intervention Topic: Coping skills   Discussion/Intervention: Patient refused to attend group.    Clinical Observations/Feedback:  Patient refused to attend group.    Arlena Marsan LRT/CTRS        Adam [name] 11/15/2022 10:20 AM 

## 2022-11-15 NOTE — Group Note (Signed)
[facility] LCSW Group Therapy Note   Group Date: 11/15/2022 Start Time: 1300 End Time: 1400   Type of Therapy/Topic:  Group Therapy:  Emotion Regulation  Participation Level:  Did Not Attend   Mood:  Description of Group:    The purpose of this group is to assist patients in learning to regulate negative emotions and experience positive emotions. Patients will be guided to discuss ways in which they have been vulnerable to their negative emotions. These vulnerabilities will be juxtaposed with experiences of positive emotions or situations, and patients challenged to use positive emotions to combat negative ones. Special emphasis will be placed on coping with negative emotions in conflict situations, and patients will process healthy conflict resolution skills.  Therapeutic Goals: Patient will identify two positive emotions or experiences to reflect on in order to balance out negative emotions:  Patient will label two or more emotions that they find the most difficult to experience:  Patient will be able to demonstrate positive conflict resolution skills through discussion or role plays:   Summary of Patient Progress:   Patient did not attend group despite encouraged participation.      Therapeutic Modalities:   Cognitive Behavioral Therapy Feelings Identification Dialectical Behavioral Therapy   Carleen Rhue W Ryelee Albee, LCSWA 

## 2022-11-15 NOTE — Progress Notes (Signed)
Patient pacing the halls. Visible in the dayroom as well.  Labile mood this evening. He is med compliant and received meds without issue.  He denies having any psychotic symptoms at this encounter.  Will continue to offer support and encouragement. Q15 minute safety checks in place,.   C Butler-Nicholson, LPN

## 2022-11-15 NOTE — Progress Notes (Signed)
[facility] MD Progress Note  11/15/2022 11:25 AM Adam [name]  MRN:  [id number] Subjective: Follow-up patient with schizophrenia and history of stroke.  Overall no real change in behavior pattern.  No aggression no violence no evidence of acute dangerousness.  For what it is worth, several treatment team members have noticed that he has occasionally made a little bit more sense with his talking recently.  Not a lot but sometimes there will be up to 3 words in a row that together makes sense.  Not consistent. 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[place])   Stroke (HCC)   HTN (hypertension)   HLD (hyperlipidemia)   CAD (coronary artery disease)   Chronic systolic CHF (congestive heart failure) (HCC)   Anxiety   Infection of left earlobe  Total Time spent with patient: 30 minutes  Past Psychiatric History: Past history of schizophrenia  Past Medical History:  Past Medical History:  Diagnosis Date   Myocardial infarction ([place])    Schizophrenia ([place])    Stroke ([place])     Past Surgical History:  Procedure Laterality Date   NO PAST SURGERIES     Family History:  Family History  Problem Relation Age of Onset   Hypertension Mother    Family Psychiatric  History: See previous Social History:  Social History   Substance and Sexual Activity  Alcohol Use Not Currently     Social History   Substance and Sexual Activity  Drug Use Not Currently    Social History   Socioeconomic History   Marital status: Single    Spouse name: Not on file   Number of children: Not on file   Years of education: Not on file   Highest education level: Not on file  Occupational History   Not on file  Tobacco Use   Smoking status: Never    Passive exposure: Never   Smokeless tobacco:  Never  Vaping Use   Vaping Use: Unknown  Substance and Sexual Activity   Alcohol use: Not Currently   Drug use: Not Currently   Sexual activity: Not Currently  Other Topics Concern   Not on file  Social History Narrative   Not on file   Social Determinants of Health   Financial Resource Strain: Not on file  Food Insecurity: Not on file  Transportation Needs: Not on file  Physical Activity: Not on file  Stress: Not on file  Social Connections: Not on file   Additional Social History:  Specify valuables returned:  (none)                      Sleep: Fair  Appetite:  Fair  Current Medications: Current Facility-Administered Medications  Medication Dose Route Frequency Provider Last Rate Last Admin   acetaminophen (TYLENOL) tablet 650 mg  650 mg Oral Q6H PRN Breckin Zafar T, MD   650 mg at 10/14/22 2328   alum & mag hydroxide-simeth (MAALOX/MYLANTA) 200-200-20 MG/5ML suspension 30 mL  30 mL Oral Q4H PRN Lashann Hagg T, MD   30 mL at 04/26/22 [id number]   aspirin EC tablet 81 mg  81 mg Oral Daily Charlis Harner, [name], MD   81 mg at 11/14/22 0853   atorvastatin (LIPITOR) tablet 20 mg  20 mg Oral Daily Dewell Monnier, [name], MD   20 mg at 11/14/22 0853   atropine  1 % ophthalmic solution 1 drop  1 drop Sublingual QID [name], MD   1 drop at 11/14/22 2115   benztropine (COGENTIN) tablet 0.5 mg  0.5 mg Oral BID Yancey Pedley T, MD   0.5 mg at 11/14/22 1600   clonazePAM (KLONOPIN) tablet 1 mg  1 mg Oral TID PRN He, [name], MD   1 mg at 11/13/22 2004   cloZAPine (CLOZARIL) tablet 300 mg  300 mg Oral QHS Daylyn Azbill T, MD   300 mg at 11/14/22 2114   diphenhydrAMINE (BENADRYL) capsule 50 mg  50 mg Oral Q6H PRN Aivy Akter T, MD   50 mg at 09/30/22 2119   Or   diphenhydrAMINE (BENADRYL) injection 50 mg  50 mg Intramuscular Q6H PRN Garry Nicolini T, MD       feeding supplement (ENSURE ENLIVE / ENSURE PLUS) liquid 237 mL  237 mL Oral BID BM Makayela Secrest T, MD   237 mL at 11/14/22 1423    gabapentin (NEURONTIN) capsule 300 mg  300 mg Oral TID Tawanna Funk T, MD   300 mg at 11/14/22 2114   haloperidol (HALDOL) tablet 2 mg  2 mg Oral TID Allysson Rinehimer T, MD   2 mg at 11/14/22 2114   haloperidol (HALDOL) tablet 5 mg  5 mg Oral Q6H PRN Whitlee Sluder T, MD   5 mg at 11/11/22 2116   Or   haloperidol lactate (HALDOL) injection 5 mg  5 mg Intramuscular Q6H PRN Jquan Egelston T, MD       lisinopril (ZESTRIL) tablet 5 mg  5 mg Oral Daily Kadeem Hyle T, MD   5 mg at 11/14/22 0853   magnesium hydroxide (MILK OF MAGNESIA) suspension 30 mL  30 mL Oral Daily PRN Kebrina Friend T, MD   30 mL at 11/03/22 0803   metoprolol succinate (TOPROL-XL) 24 hr tablet 25 mg  25 mg Oral Daily Rae Plotner T, MD   25 mg at 11/14/22 0853   ondansetron (ZOFRAN) tablet 4 mg  4 mg Oral Q8H PRN Lerry Liner M, NP   4 mg at 09/06/22 2320   paliperidone (INVEGA SUSTENNA) injection 156 mg  156 mg Intramuscular Q28 days Royale Lennartz T, MD   156 mg at 10/23/22 1715   paliperidone (INVEGA) 24 hr tablet 6 mg  6 mg Oral QHS Deborah Lazcano T, MD   6 mg at 11/14/22 2114   senna-docusate (Senokot-S) tablet 2 tablet  2 tablet Oral QHS PRN Sarina Ill, DO   2 tablet at 09/15/22 2110   temazepam (RESTORIL) capsule 15 mg  15 mg Oral QHS Shawnmichael Parenteau T, MD   15 mg at 11/14/22 2114   ziprasidone (GEODON) injection 20 mg  20 mg Intramuscular Q12H PRN Samanthan Dugo, [name], MD   20 mg at 07/08/22 1453    Lab Results: No results found for this or any previous visit (from the past 48 hour(s)).  Blood Alcohol level:  Lab Results  Component Value Date   ETH <10 07/20/2021    Metabolic Disorder Labs: Lab Results  Component Value Date   HGBA1C 5.5 04/10/2022   MPG 111.15 04/10/2022   No results found for: "PROLACTIN" Lab Results  Component Value Date   CHOL 167 11/20/2021   TRIG 107 11/20/2021   HDL 26 (L) 11/20/2021   CHOLHDL 6.4 11/20/2021   VLDL 21 11/20/2021   LDLCALC 120 (H) 11/20/2021    Physical  Findings: AIMS: Facial and Oral Movements Muscles of Facial  Expression: None, normal Lips and Perioral Area: None, normal Jaw: None, normal Tongue: None, normal,Extremity Movements Upper (arms, wrists, hands, fingers): None, normal Lower (legs, knees, ankles, toes): None, normal, Trunk Movements Neck, shoulders, hips: None, normal, Overall Severity Severity of abnormal movements (highest score from questions above): None, normal Incapacitation due to abnormal movements: None, normal Patient's awareness of abnormal movements (rate only patient's report): No Awareness, Dental Status Current problems with teeth and/or dentures?: No Does patient usually wear dentures?: No  CIWA:    COWS:     Musculoskeletal: Strength & Muscle Tone: within normal limits Gait & Station: normal Patient leans: N/A  Psychiatric Specialty Exam:  Presentation  General Appearance:  Appropriate for Environment; Casual; Neat; Well Groomed  Eye Contact: Fair  Speech: Slow  Speech Volume: Decreased  Handedness: Left   Mood and Affect  Mood: Anxious  Affect: Congruent   Thought Process  Thought Processes: Goal Directed  Descriptions of Associations:Circumstantial  Orientation:Partial  Thought Content:Scattered  History of Schizophrenia/Schizoaffective disorder:Yes  Duration of Psychotic Symptoms:Greater than six months  Hallucinations:No data recorded Ideas of Reference:None  Suicidal Thoughts:No data recorded Homicidal Thoughts:No data recorded  Sensorium  Memory: Remote Good  Judgment: Fair  Insight: Fair   Community education officer  Concentration: Fair  Attention Span: Fair  Recall: [place] of Knowledge: Fair  Language: Fair   Psychomotor Activity  Psychomotor Activity:No data recorded  Assets  Assets:No data recorded  Sleep  Sleep:No data recorded   Physical Exam: Physical Exam Vitals reviewed.  Constitutional:      Appearance: Normal  appearance.  HENT:     Head: Normocephalic and atraumatic.     Mouth/Throat:     Pharynx: Oropharynx is clear.  Eyes:     Pupils: Pupils are equal, round, and reactive to light.  Cardiovascular:     Rate and Rhythm: Normal rate and regular rhythm.  Pulmonary:     Effort: Pulmonary effort is normal.     Breath sounds: Normal breath sounds.  Abdominal:     General: Abdomen is flat.     Palpations: Abdomen is soft.  Musculoskeletal:        General: Normal range of motion.  Skin:    General: Skin is warm and dry.  Neurological:     General: No focal deficit present.     Mental Status: He is alert. Mental status is at baseline.  Psychiatric:        Attention and Perception: He is inattentive.        Mood and Affect: Mood normal. Affect is blunt.        Speech: He is noncommunicative.    Review of Systems  Unable to perform ROS: Language   Blood pressure 112/79, pulse (!) 109, temperature 100 F (37.8 C), temperature source Oral, resp. rate 18, height 5\' 8"  (1.727 m), weight 85.8 kg, SpO2 96 %. Body mass index is 28.76 kg/m.   Treatment Plan Summary: Medication management and Plan no change to medication management.  Meeting was held this week to continue to review the options for placement.  There seemed to be some difficult road blocks many of which make no clinical sense.  Nothing really indicated to change here we are still looking for placement.  Alethia Berthold, MD 11/15/2022, 11:25 AM

## 2022-11-16 NOTE — Progress Notes (Signed)
[facility] MD Progress Note  11/16/2022 3:56 PM Adam [name]  MRN:  [id number] Subjective: Follow-up patient with schizophrenia.  Absolutely no change to condition. 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)   Stroke (HCC)   HTN (hypertension)   HLD (hyperlipidemia)   CAD (coronary artery disease)   Chronic systolic CHF (congestive heart failure) (HCC)   Anxiety   Infection of left earlobe  Total Time spent with patient: 30 minutes  Past Psychiatric History: Past history of schizophrenia  Past Medical History:  Past Medical History:  Diagnosis Date   Myocardial infarction (HCC)    Schizophrenia (HCC)    Stroke (HCC)     Past Surgical History:  Procedure Laterality Date   NO PAST SURGERIES     Family History:  Family History  Problem Relation Age of Onset   Hypertension Mother    Family Psychiatric  History: See previous Social History:  Social History   Substance and Sexual Activity  Alcohol Use Not Currently     Social History   Substance and Sexual Activity  Drug Use Not Currently    Social History   Socioeconomic History   Marital status: Single    Spouse name: Not on file   Number of children: Not on file   Years of education: Not on file   Highest education level: Not on file  Occupational History   Not on file  Tobacco Use   Smoking status: Never    Passive exposure: Never   Smokeless tobacco: Never  Vaping Use   Vaping Use: Unknown  Substance and Sexual Activity   Alcohol use: Not Currently   Drug use: Not Currently   Sexual activity: Not Currently  Other Topics Concern   Not on file  Social History Narrative   Not on file   Social Determinants of Health   Financial Resource Strain: Not on file  Food Insecurity: Not on  file  Transportation Needs: Not on file  Physical Activity: Not on file  Stress: Not on file  Social Connections: Not on file   Additional Social History:  Specify valuables returned:  (none)                      Sleep: Fair  Appetite:  Fair  Current Medications: Current Facility-Administered Medications  Medication Dose Route Frequency Provider Last Rate Last Admin   acetaminophen (TYLENOL) tablet 650 mg  650 mg Oral Q6H PRN Kentravious Lipford T, MD   650 mg at 11/15/22 1239   alum & mag hydroxide-simeth (MAALOX/MYLANTA) 200-200-20 MG/5ML suspension 30 mL  30 mL Oral Q4H PRN Zerenity Bowron, [name], MD   30 mL at 04/26/22 5643   aspirin EC tablet 81 mg  81 mg Oral Daily Tashiya Souders T, MD   81 mg at 11/15/22 1249   atorvastatin (LIPITOR) tablet 20 mg  20 mg Oral Daily Mick Tanguma T, MD   20 mg at 11/15/22 1249   atropine 1 % ophthalmic solution 1 drop  1 drop Sublingual QID [name], MD   1 drop at 11/15/22 2103   benztropine (COGENTIN) tablet 0.5 mg  0.5 mg Oral BID Penney Domanski T, MD   0.5 mg at 11/15/22 2059   clonazePAM (KLONOPIN) tablet 1 mg  1 mg Oral TID PRN He, [name], MD   1 mg at  11/13/22 2004   cloZAPine (CLOZARIL) tablet 300 mg  300 mg Oral QHS Carmita Boom T, MD   300 mg at 11/15/22 2103   diphenhydrAMINE (BENADRYL) capsule 50 mg  50 mg Oral Q6H PRN Aarini Slee T, MD   50 mg at 09/30/22 2119   Or   diphenhydrAMINE (BENADRYL) injection 50 mg  50 mg Intramuscular Q6H PRN Emonni Depasquale T, MD       feeding supplement (ENSURE ENLIVE / ENSURE PLUS) liquid 237 mL  237 mL Oral BID BM Raenah Murley T, MD   237 mL at 11/15/22 1249   gabapentin (NEURONTIN) capsule 300 mg  300 mg Oral TID Ivanna Kocak T, MD   300 mg at 11/15/22 2059   haloperidol (HALDOL) tablet 2 mg  2 mg Oral TID Teegan Brandis T, MD   2 mg at 11/15/22 2059   haloperidol (HALDOL) tablet 5 mg  5 mg Oral Q6H PRN Ladonte Verstraete T, MD   5 mg at 11/11/22 2116   Or   haloperidol lactate (HALDOL) injection 5 mg  5 mg  Intramuscular Q6H PRN Hines Kloss T, MD       lisinopril (ZESTRIL) tablet 5 mg  5 mg Oral Daily Irisa Grimsley T, MD   5 mg at 11/15/22 1249   magnesium hydroxide (MILK OF MAGNESIA) suspension 30 mL  30 mL Oral Daily PRN Eithel Ryall T, MD   30 mL at 11/03/22 0803   metoprolol succinate (TOPROL-XL) 24 hr tablet 25 mg  25 mg Oral Daily Doralene Glanz T, MD   25 mg at 11/15/22 1249   ondansetron (ZOFRAN) tablet 4 mg  4 mg Oral Q8H PRN Jearld Lesch, NP   4 mg at 09/06/22 2320   paliperidone (INVEGA SUSTENNA) injection 156 mg  156 mg Intramuscular Q28 days Erykah Lippert T, MD   156 mg at 10/23/22 1715   paliperidone (INVEGA) 24 hr tablet 6 mg  6 mg Oral QHS Gevork Ayyad T, MD   6 mg at 11/15/22 2104   senna-docusate (Senokot-S) tablet 2 tablet  2 tablet Oral QHS PRN Sarina Ill, DO   2 tablet at 09/15/22 2110   temazepam (RESTORIL) capsule 15 mg  15 mg Oral QHS Adonys Wildes T, MD   15 mg at 11/15/22 2103   ziprasidone (GEODON) injection 20 mg  20 mg Intramuscular Q12H PRN Chauncy Mangiaracina, [name], MD   20 mg at 07/08/22 1453    Lab Results:  Results for orders placed or performed during the hospital encounter of 03/19/22 (from the past 48 hour(s))  Respiratory (~20 pathogens) panel by PCR     Status: None   Collection Time: 11/15/22 11:58 AM   Specimen: Nasopharyngeal Swab; Respiratory  Result Value Ref Range   Adenovirus NOT DETECTED NOT DETECTED   Coronavirus 229E NOT DETECTED NOT DETECTED    Comment: (NOTE) The Coronavirus on the Respiratory Panel, DOES NOT test for the novel  Coronavirus (2019 nCoV)    Coronavirus HKU1 NOT DETECTED NOT DETECTED   Coronavirus NL63 NOT DETECTED NOT DETECTED   Coronavirus OC43 NOT DETECTED NOT DETECTED   Metapneumovirus NOT DETECTED NOT DETECTED   Rhinovirus / Enterovirus NOT DETECTED NOT DETECTED   Influenza A NOT DETECTED NOT DETECTED   Influenza B NOT DETECTED NOT DETECTED   Parainfluenza Virus 1 NOT DETECTED NOT DETECTED   Parainfluenza  Virus 2 NOT DETECTED NOT DETECTED   Parainfluenza Virus 3 NOT DETECTED NOT DETECTED   Parainfluenza Virus 4 NOT  DETECTED NOT DETECTED   Respiratory Syncytial Virus NOT DETECTED NOT DETECTED   Bordetella pertussis NOT DETECTED NOT DETECTED   Bordetella Parapertussis NOT DETECTED NOT DETECTED   Chlamydophila pneumoniae NOT DETECTED NOT DETECTED   Mycoplasma pneumoniae NOT DETECTED NOT DETECTED    Comment: Performed at [facility], [address] [place], [place] [address]    Blood Alcohol level:  Lab Results  Component Value Date   ETH <10 07/20/2021    Metabolic Disorder Labs: Lab Results  Component Value Date   HGBA1C 5.5 04/10/2022   MPG 111.15 04/10/2022   No results found for: "PROLACTIN" Lab Results  Component Value Date   CHOL 167 11/20/2021   TRIG 107 11/20/2021   HDL 26 (L) 11/20/2021   CHOLHDL 6.4 11/20/2021   VLDL 21 11/20/2021   LDLCALC 120 (H) 11/20/2021    Physical Findings: AIMS: Facial and Oral Movements Muscles of Facial Expression: None, normal Lips and Perioral Area: None, normal Jaw: None, normal Tongue: None, normal,Extremity Movements Upper (arms, wrists, hands, fingers): None, normal Lower (legs, knees, ankles, toes): None, normal, Trunk Movements Neck, shoulders, hips: None, normal, Overall Severity Severity of abnormal movements (highest score from questions above): None, normal Incapacitation due to abnormal movements: None, normal Patient's awareness of abnormal movements (rate only patient's report): No Awareness, Dental Status Current problems with teeth and/or dentures?: No Does patient usually wear dentures?: No  CIWA:    COWS:     Musculoskeletal: Strength & Muscle Tone: within normal limits Gait & Station: normal Patient leans: N/A  Psychiatric Specialty Exam:  Presentation  General Appearance:  Appropriate for Environment; Casual; Neat; Well Groomed  Eye Contact: Fair  Speech: Slow  Speech  Volume: Decreased  Handedness: Left   Mood and Affect  Mood: Anxious  Affect: Congruent   Thought Process  Thought Processes: Goal Directed  Descriptions of Associations:Circumstantial  Orientation:Partial  Thought Content:Scattered  History of Schizophrenia/Schizoaffective disorder:Yes  Duration of Psychotic Symptoms:Greater than six months  Hallucinations:No data recorded Ideas of Reference:None  Suicidal Thoughts:No data recorded Homicidal Thoughts:No data recorded  Sensorium  Memory: Remote Good  Judgment: Fair  Insight: Fair   Art therapist  Concentration: Fair  Attention Span: Fair  Recall: Fair  Fund of Knowledge: Fair  Language: Fair   Psychomotor Activity  Psychomotor Activity:No data recorded  Assets  Assets:No data recorded  Sleep  Sleep:No data recorded   Physical Exam: Physical Exam Vitals reviewed.  Constitutional:      Appearance: Normal appearance.  HENT:     Head: Normocephalic and atraumatic.     Mouth/Throat:     Pharynx: Oropharynx is clear.  Eyes:     Pupils: Pupils are equal, round, and reactive to light.  Cardiovascular:     Rate and Rhythm: Normal rate and regular rhythm.  Pulmonary:     Effort: Pulmonary effort is normal.     Breath sounds: Normal breath sounds.  Abdominal:     General: Abdomen is flat.     Palpations: Abdomen is soft.  Musculoskeletal:        General: Normal range of motion.  Skin:    General: Skin is warm and dry.  Neurological:     General: No focal deficit present.     Mental Status: He is alert. Mental status is at baseline.  Psychiatric:        Attention and Perception: He is inattentive.        Mood and Affect: Mood normal. Affect is blunt.  Speech: He is noncommunicative.    Review of Systems  Unable to perform ROS: Language   Blood pressure 95/74, pulse 99, temperature 98.6 F (37 C), temperature source Oral, resp. rate 20, height 5\' 8"  (1.727 m),  weight 85.8 kg, SpO2 99 %. Body mass index is 28.76 kg/m.   Treatment Plan Summary: Medication management and Plan no change to medication.  Continues to wait for placement  , MD 11/16/2022, 3:56 PM

## 2022-11-16 NOTE — Progress Notes (Signed)
Recreation Therapy Notes  Date: 11/16/2022  Time: 10:25 am    Location: Craft room     Behavioral response: N/A   Intervention Topic: Creative expressions   Discussion/Intervention: Patient refused to attend group.   Clinical Observations/Feedback:  Patient refused to attend group.    Hindy Perrault LRT/CTRS         Kaushik Maul 11/16/2022 12:31 PM

## 2022-11-16 NOTE — Group Note (Signed)
[facility] LCSW Group Therapy Note   Group Date: 11/16/2022 Start Time: 1300 End Time: 1400  Type of Therapy and Topic:  Group Therapy:  Feelings around Relapse and Recovery  Participation Level:  None    Description of Group:    Patients in this group will discuss emotions they experience before and after a relapse. They will process how experiencing these feelings, or avoidance of experiencing them, relates to having a relapse. Facilitator will guide patients to explore emotions they have related to recovery. Patients will be encouraged to process which emotions are more powerful. They will be guided to discuss the emotional reaction significant others in their lives may have to patients' relapse or recovery. Patients will be assisted in exploring ways to respond to the emotions of others without this contributing to a relapse.  Therapeutic Goals: Patient will identify two or more emotions that lead to relapse for them:  Patient will identify two emotions that result when they relapse:  Patient will identify two emotions related to recovery:  Patient will demonstrate ability to communicate their needs through discussion and/or role plays.   Summary of Patient Progress: Patient was in and out of group during the group process. He did not participate in the discussion other than to says "drugs, drugs" after a peer mentioned relapsing on drugs. After this pt left the room again and did not return.    Therapeutic Modalities:   Cognitive Behavioral Therapy Solution-Focused Therapy Assertiveness Training Relapse Prevention Therapy   Glenis Smoker, LCSW

## 2022-11-16 NOTE — Plan of Care (Signed)
D: Patient alert and oriented. Patient denies pain. Patient denies anxiety and depression. Patient denies SI/HI/AVH. Patient refused all scheduled medication during this shift.  A:   Support and encouragement provided to patient.  Q15 minute safety checks maintained.   R: Patient remains safe on the unit at this time. Problem: Education: Goal: Knowledge of General Education information will improve Description: Including pain rating scale, medication(s)/side effects and non-pharmacologic comfort measures Outcome: Progressing   Problem: Clinical Measurements: Goal: Ability to maintain clinical measurements within normal limits will improve Outcome: Progressing   Problem: Safety: Goal: Ability to remain free from injury will improve Outcome: Progressing   Problem: Education: Goal: Will be free of psychotic symptoms Outcome: Progressing

## 2022-11-16 NOTE — Plan of Care (Signed)
Pt denies anxiety/depression at this time. Pt denies SI/HI/AVH or pain at this time. Pt is calm and cooperative. Pt is medication compliant. Pt provided with support and encouragement. Pt monitored q15 minutes for safety per unit policy. Plan of care ongoing.   Problem: Self-Concept: Goal: Level of anxiety will decrease Outcome: Progressing   Problem: Nutrition: Goal: Adequate nutrition will be maintained Outcome: Progressing   

## 2022-11-17 NOTE — Progress Notes (Signed)
Patient is  alert and oriented x 4, affect is blunted, he appears irritable, angry and unwilling to interact with staff. Patient denies SI/HI/AVH, but noted anxious and apprehensive, patient was offered emotional support. Patient denies medication regimen no distress noted will continue to monitor.

## 2022-11-17 NOTE — Progress Notes (Signed)
[facility] MD Progress Note  11/17/2022 3:15 PM Clance Bendall  MRN:  [id number] Subjective: Adam [name] is seen on rounds.  No changes.  No complaints.  No issues.  Mood and affect are stable. 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[place])   Stroke (HCC)   HTN (hypertension)   HLD (hyperlipidemia)   CAD (coronary artery disease)   Chronic systolic CHF (congestive heart failure) (HCC)   Anxiety   Infection of left earlobe  Total Time spent with patient: 15 minutes  Past Psychiatric History: CVA and schizophrenia  Past Medical History:  Past Medical History:  Diagnosis Date   Myocardial infarction ([place])    Schizophrenia ([place])    Stroke ([place])     Past Surgical History:  Procedure Laterality Date   NO PAST SURGERIES     Family History:  Family History  Problem Relation Age of Onset   Hypertension Mother    Allergies:Social History:  Social History   Substance and Sexual Activity  Alcohol Use Not Currently     Social History   Substance and Sexual Activity  Drug Use Not Currently    Social History   Socioeconomic History   Marital status: Single    Spouse name: Not on file   Number of children: Not on file   Years of education: Not on file   Highest education level: Not on file  Occupational History   Not on file  Tobacco Use   Smoking status: Never    Passive exposure: Never   Smokeless tobacco: Never  Vaping Use   Vaping Use: Unknown  Substance and Sexual Activity   Alcohol use: Not Currently   Drug use: Not Currently   Sexual activity: Not Currently  Other Topics Concern   Not on file  Social History Narrative   Not on file   Social Determinants of Health   Financial Resource Strain: Not on file  Food Insecurity: Not on file  Transportation  Needs: Not on file  Physical Activity: Not on file  Stress: Not on file  Social Connections: Not on file   Additional Social History:  Specify valuables returned:  (none)                      Sleep: Good  Appetite:  Good  Current Medications: Current Facility-Administered Medications  Medication Dose Route Frequency Provider Last Rate Last Admin   acetaminophen (TYLENOL) tablet 650 mg  650 mg Oral Q6H PRN [name], [name], MD   650 mg at 11/15/22 1239   alum & mag hydroxide-simeth (MAALOX/MYLANTA) 200-200-20 MG/5ML suspension 30 mL  30 mL Oral Q4H PRN [name], [name], MD   30 mL at 04/26/22 [id number]   aspirin EC tablet 81 mg  81 mg Oral Daily [name], [name], MD   81 mg at 11/17/22 0745   atorvastatin (LIPITOR) tablet 20 mg  20 mg Oral Daily [name], [name], MD   20 mg at 11/17/22 0745   atropine 1 % ophthalmic solution 1 drop  1 drop Sublingual QID [name], MD   1 drop at 11/17/22 1141   benztropine (COGENTIN) tablet 0.5 mg  0.5 mg Oral BID [name], [name], MD   0.5 mg at 11/17/22 0745   clonazePAM (KLONOPIN) tablet 1 mg  1 mg Oral TID PRN He, [name], MD  1 mg at 11/13/22 2004   cloZAPine (CLOZARIL) tablet 300 mg  300 mg Oral QHS [name], [name], MD   300 mg at 11/15/22 2103   diphenhydrAMINE (BENADRYL) capsule 50 mg  50 mg Oral Q6H PRN [name], [name], MD   50 mg at 11/17/22 9983   Or   diphenhydrAMINE (BENADRYL) injection 50 mg  50 mg Intramuscular Q6H PRN [name], [name], MD       feeding supplement (ENSURE ENLIVE / ENSURE PLUS) liquid 237 mL  237 mL Oral BID BM [name], [name], MD   237 mL at 11/17/22 1319   gabapentin (NEURONTIN) capsule 300 mg  300 mg Oral TID [name], [name], MD   300 mg at 11/17/22 1504   haloperidol (HALDOL) tablet 2 mg  2 mg Oral TID [name], [name], MD   2 mg at 11/17/22 1504   haloperidol (HALDOL) tablet 5 mg  5 mg Oral Q6H PRN [name], [name], MD   5 mg at 11/17/22 3825   Or   haloperidol lactate (HALDOL) injection 5 mg  5 mg Intramuscular Q6H PRN  [name], [name], MD       lisinopril (ZESTRIL) tablet 5 mg  5 mg Oral Daily [name], [name], MD   5 mg at 11/17/22 0745   magnesium hydroxide (MILK OF MAGNESIA) suspension 30 mL  30 mL Oral Daily PRN [name], [name], MD   30 mL at 11/03/22 0803   metoprolol succinate (TOPROL-XL) 24 hr tablet 25 mg  25 mg Oral Daily [name], [name], MD   25 mg at 11/17/22 0745   ondansetron (ZOFRAN) tablet 4 mg  4 mg Oral Q8H PRN Lerry Liner M, NP   4 mg at 09/06/22 2320   paliperidone (INVEGA SUSTENNA) injection 156 mg  156 mg Intramuscular Q28 days [name], [name], MD   156 mg at 10/23/22 1715   paliperidone (INVEGA) 24 hr tablet 6 mg  6 mg Oral QHS [name], [name], MD   6 mg at 11/15/22 2104   senna-docusate (Senokot-S) tablet 2 tablet  2 tablet Oral QHS PRN Sarina Ill, DO   2 tablet at 09/15/22 2110   temazepam (RESTORIL) capsule 15 mg  15 mg Oral QHS [name], [name], MD   15 mg at 11/15/22 2103   ziprasidone (GEODON) injection 20 mg  20 mg Intramuscular Q12H PRN [name], [name], MD   20 mg at 07/08/22 1453    Lab Results: No results found for this or any previous visit (from the past 48 hour(s)).  Blood Alcohol level:  Lab Results  Component Value Date   ETH <10 07/20/2021    Metabolic Disorder Labs: Lab Results  Component Value Date   HGBA1C 5.5 04/10/2022   MPG 111.15 04/10/2022   No results found for: "PROLACTIN" Lab Results  Component Value Date   CHOL 167 11/20/2021   TRIG 107 11/20/2021   HDL 26 (L) 11/20/2021   CHOLHDL 6.4 11/20/2021   VLDL 21 11/20/2021   LDLCALC 120 (H) 11/20/2021    Physical Findings: AIMS: Facial and Oral Movements Muscles of Facial Expression: None, normal Lips and Perioral Area: None, normal Jaw: None, normal Tongue: None, normal,Extremity Movements Upper (arms, wrists, hands, fingers): None, normal Lower (legs, knees, ankles, toes): None, normal, Trunk Movements Neck, shoulders, hips: None, normal, Overall Severity Severity of abnormal  movements (highest score from questions above): None, normal Incapacitation due to abnormal movements: None, normal Patient's awareness of abnormal movements (  rate only patient's report): No Awareness, Dental Status Current problems with teeth and/or dentures?: No Does patient usually wear dentures?: No  CIWA:    COWS:     Musculoskeletal: Strength & Muscle Tone: within normal limits Gait & Station: normal Patient leans: N/A  Psychiatric Specialty Exam:  Presentation  General Appearance:  Appropriate for Environment; Casual; Neat; Well Groomed  Eye Contact: Fair  Speech: Slow  Speech Volume: Decreased  Handedness: Left   Mood and Affect  Mood: Anxious  Affect: Congruent   Thought Process  Thought Processes: Goal Directed  Descriptions of Associations:Circumstantial  Orientation:Partial  Thought Content:Scattered  History of Schizophrenia/Schizoaffective disorder:Yes  Duration of Psychotic Symptoms:Greater than six months  Hallucinations:No data recorded Ideas of Reference:None  Suicidal Thoughts:No data recorded Homicidal Thoughts:No data recorded  Sensorium  Memory: Remote Good  Judgment: Fair  Insight: Fair   Materials engineer: Fair  Attention Span: Fair  Recall: [place] of Knowledge: Fair  Language: Fair   Psychomotor Activity  Psychomotor Activity:No data recorded  Assets  Assets:No data recorded  Sleep  Sleep:No data recorded    Blood pressure 115/84, pulse (!) 101, temperature 98.6 F (37 C), temperature source Oral, resp. rate 20, height 5\' 8"  (1.727 m), weight 85.8 kg, SpO2 98 %. Body mass index is 28.76 kg/m.   Treatment Plan Summary: Daily contact with patient to assess and evaluate symptoms and progress in treatment, Medication management, and Plan continue current medications.  [name], DO 11/17/2022, 3:15 PM

## 2022-11-18 LAB — CBC WITH DIFFERENTIAL/PLATELET
Abs Immature Granulocytes: 0.01 10*3/uL (ref 0.00–0.07)
Basophils Absolute: 0.1 10*3/uL (ref 0.0–0.1)
Basophils Relative: 2 %
Eosinophils Absolute: 0 10*3/uL (ref 0.0–0.5)
Eosinophils Relative: 0 %
HCT: 39 % (ref 39.0–52.0)
Hemoglobin: 13.6 g/dL (ref 13.0–17.0)
Immature Granulocytes: 0 %
Lymphocytes Relative: 22 %
Lymphs Abs: 1 10*3/uL (ref 0.7–4.0)
MCH: 31.4 pg (ref 26.0–34.0)
MCHC: 34.9 g/dL (ref 30.0–36.0)
MCV: 90.1 fL (ref 80.0–100.0)
Monocytes Absolute: 0.5 10*3/uL (ref 0.1–1.0)
Monocytes Relative: 11 %
Neutro Abs: 2.9 10*3/uL (ref 1.7–7.7)
Neutrophils Relative %: 65 %
Platelets: 182 10*3/uL (ref 150–400)
RBC: 4.33 MIL/uL (ref 4.22–5.81)
RDW: 12.4 % (ref 11.5–15.5)
WBC: 4.5 10*3/uL (ref 4.0–10.5)
nRBC: 0 % (ref 0.0–0.2)

## 2022-11-18 NOTE — Progress Notes (Signed)
Patient is alert and oriented x 4, affect is flat but brightens upon approach, he appears less irritable and interacting appropriately with peers and staff. Patient denies SI/HI/AVH, he was offered emotional support. Patient was complaint with medication regimen no distress noted will continue to monitor.     

## 2022-11-18 NOTE — Plan of Care (Signed)
Pt slept until noon, accepted his medications but did not seem to be in a good mood.  His affect was sad and he demonstrated lability In mood.  He paced occasionally and seemed frustrated but would not indicate why.  Pt refused his ensures and after having refused lab draws earlier in the day, began demanding them around 2 PM.  This writer called lab to have them return to draw labs.  Pt was more social later in the shift.  He was med compliant and it was reported that he ate lunch but refused dinner.  Continued monitoring via q 15 min observations.

## 2022-11-18 NOTE — [facility] Group Notes (Signed)
[facility] Group Notes:  (Nursing/MHT/Case Management/Adjunct)  Date:  11/18/2022  Time:  9:44 AM  Type of Therapy:   community meeting   Participation Level:  Did Not Attend    Rodena Goldmann 11/18/2022, 9:44 AM

## 2022-11-18 NOTE — Progress Notes (Signed)
[facility] MD Progress Note  11/18/2022 1:20 PM Adam [name]  MRN:  [id number] Subjective: Adam [name] was seen on rounds.  No complaints.  No changes and no issues. 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  Past Medical History:  Past Medical History:  Diagnosis Date   Myocardial infarction (HCC)    Schizophrenia (HCC)    Stroke (HCC)     Past Surgical History:  Procedure Laterality Date   NO PAST SURGERIES     Family History:  Family History  Problem Relation Age of Onset   Hypertension Mother     Social History:  Social History   Substance and Sexual Activity  Alcohol Use Not Currently     Social History   Substance and Sexual Activity  Drug Use Not Currently    Social History   Socioeconomic History   Marital status: Single    Spouse name: Not on file   Number of children: Not on file   Years of education: Not on file   Highest education level: Not on file  Occupational History   Not on file  Tobacco Use   Smoking status: Never    Passive exposure: Never   Smokeless tobacco: Never  Vaping Use   Vaping Use: Unknown  Substance and Sexual Activity   Alcohol use: Not Currently   Drug use: Not Currently   Sexual activity: Not Currently  Other Topics Concern   Not on file  Social History Narrative   Not on file   Social Determinants of Health   Financial Resource Strain: Not on file  Food Insecurity: Not on file  Transportation Needs: Not on file  Physical  Activity: Not on file  Stress: Not on file  Social Connections: Not on file   Additional Social History:  Specify valuables returned:  (none)                      Sleep: Good  Appetite:  Good  Current Medications: Current Facility-Administered Medications  Medication Dose Route Frequency Provider Last Rate Last Admin   acetaminophen (TYLENOL) tablet 650 mg  650 mg Oral Q6H PRN [name], [name], MD   650 mg at 11/15/22 1239   alum & mag hydroxide-simeth (MAALOX/MYLANTA) 200-200-20 MG/5ML suspension 30 mL  30 mL Oral Q4H PRN [name], [name], MD   30 mL at 04/26/22 8592   aspirin EC tablet 81 mg  81 mg Oral Daily [name], [name], MD   81 mg at 11/18/22 1205   atorvastatin (LIPITOR) tablet 20 mg  20 mg Oral Daily [name], [name], MD   20 mg at 11/18/22 1206   atropine 1 % ophthalmic solution 1 drop  1 drop Sublingual QID [name], MD   1 drop at 11/18/22 1216   benztropine (COGENTIN) tablet 0.5 mg  0.5 mg Oral BID [name], [name], MD   0.5 mg at 11/18/22 1207   clonazePAM (KLONOPIN) tablet 1 mg  1 mg Oral TID PRN He, [name], MD   1 mg at 11/13/22 2004  cloZAPine (CLOZARIL) tablet 300 mg  300 mg Oral QHS [name], [name], MD   300 mg at 11/17/22 2127   diphenhydrAMINE (BENADRYL) capsule 50 mg  50 mg Oral Q6H PRN [name], [name], MD   50 mg at 11/17/22 7341   Or   diphenhydrAMINE (BENADRYL) injection 50 mg  50 mg Intramuscular Q6H PRN [name], [name], MD       feeding supplement (ENSURE ENLIVE / ENSURE PLUS) liquid 237 mL  237 mL Oral BID BM [name], [name], MD   237 mL at 11/17/22 1319   gabapentin (NEURONTIN) capsule 300 mg  300 mg Oral TID [name], [name], MD   300 mg at 11/18/22 1205   haloperidol (HALDOL) tablet 2 mg  2 mg Oral TID [name], [name], MD   2 mg at 11/18/22 1210   haloperidol (HALDOL) tablet 5 mg  5 mg Oral Q6H PRN [name], [name], MD   5 mg at 11/17/22 9379   Or   haloperidol lactate (HALDOL) injection 5 mg  5 mg Intramuscular Q6H PRN [name], [name], MD        lisinopril (ZESTRIL) tablet 5 mg  5 mg Oral Daily [name], [name], MD   5 mg at 11/18/22 1213   magnesium hydroxide (MILK OF MAGNESIA) suspension 30 mL  30 mL Oral Daily PRN [name], [name], MD   30 mL at 11/03/22 0803   metoprolol succinate (TOPROL-XL) 24 hr tablet 25 mg  25 mg Oral Daily [name], [name], MD   25 mg at 11/18/22 1214   ondansetron (ZOFRAN) tablet 4 mg  4 mg Oral Q8H PRN Lerry Liner M, NP   4 mg at 09/06/22 2320   paliperidone (INVEGA SUSTENNA) injection 156 mg  156 mg Intramuscular Q28 days [name], [name], MD   156 mg at 10/23/22 1715   paliperidone (INVEGA) 24 hr tablet 6 mg  6 mg Oral QHS [name], [name], MD   6 mg at 11/17/22 2126   senna-docusate (Senokot-S) tablet 2 tablet  2 tablet Oral QHS PRN Sarina Ill, DO   2 tablet at 09/15/22 2110   temazepam (RESTORIL) capsule 15 mg  15 mg Oral QHS [name], [name], MD   15 mg at 11/17/22 2127   ziprasidone (GEODON) injection 20 mg  20 mg Intramuscular Q12H PRN [name], [name], MD   20 mg at 07/08/22 1453    Lab Results: No results found for this or any previous visit (from the past 48 hour(s)).  Blood Alcohol level:  Lab Results  Component Value Date   ETH <10 07/20/2021    Metabolic Disorder Labs: Lab Results  Component Value Date   HGBA1C 5.5 04/10/2022   MPG 111.15 04/10/2022   No results found for: "PROLACTIN" Lab Results  Component Value Date   CHOL 167 11/20/2021   TRIG 107 11/20/2021   HDL 26 (L) 11/20/2021   CHOLHDL 6.4 11/20/2021   VLDL 21 11/20/2021   LDLCALC 120 (H) 11/20/2021    Physical Findings: AIMS: Facial and Oral Movements Muscles of Facial Expression: None, normal Lips and Perioral Area: None, normal Jaw: None, normal Tongue: None, normal,Extremity Movements Upper (arms, wrists, hands, fingers): None, normal Lower (legs, knees, ankles, toes): None, normal, Trunk Movements Neck, shoulders, hips: None, normal, Overall Severity Severity of abnormal movements (highest score  from questions above): None, normal Incapacitation due to abnormal movements: None, normal Patient's awareness of abnormal movements (rate only patient's report): No Awareness, Dental  Status Current problems with teeth and/or dentures?: No Does patient usually wear dentures?: No  CIWA:    COWS:     Musculoskeletal: Strength & Muscle Tone: within normal limits Gait & Station: normal Patient leans: N/A  Psychiatric Specialty Exam:  Presentation  General Appearance:  Appropriate for Environment; Casual; Neat; Well Groomed  Eye Contact: Fair  Speech: Slow  Speech Volume: Decreased  Handedness: Left   Mood and Affect  Mood: Anxious  Affect: Congruent   Thought Process  Thought Processes: Goal Directed  Descriptions of Associations:Circumstantial  Orientation:Partial  Thought Content:Scattered  History of Schizophrenia/Schizoaffective disorder:Yes  Duration of Psychotic Symptoms:Greater than six months  Hallucinations:No data recorded Ideas of Reference:None  Suicidal Thoughts:No data recorded Homicidal Thoughts:No data recorded  Sensorium  Memory: Remote Good  Judgment: Fair  Insight: Fair   Chartered certified accountant: Fair  Attention Span: Fair  Recall: Fair  Fund of Knowledge: Fair  Language: Fair   Psychomotor Activity  Psychomotor Activity:No data recorded  Assets  Assets:No data recorded  Sleep  Sleep:No data recorded    Blood pressure 106/76, pulse 98, temperature 98.5 F (36.9 C), resp. rate 20, height 5\' 8"  (1.727 m), weight 85.8 kg, SpO2 98 %. Body mass index is 28.76 kg/m.   Treatment Plan Summary: Daily contact with patient to assess and evaluate symptoms and progress in treatment, Medication management, and Plan continue current medications.  Twilla Khouri , DO 11/18/2022, 1:20 PM

## 2022-11-19 NOTE — Progress Notes (Signed)
Patient came up to this writer motioning to his face, in which this Clinical research associate knows from previous experience that he wants to shave. This writer shaved patient's face, in which he tolerated it well, without any issues. Patient remains safe on the unit.

## 2022-11-19 NOTE — Progress Notes (Signed)
Recreation Therapy Notes  Date: 11/19/2022   Time: 10:25 am     Location: Court yard    Behavioral response: N/A   Intervention Topic: Honesty    Discussion/Intervention: Patient refused to attend group.    Clinical Observations/Feedback:  Patient refused to attend group.    Adam [name] LRT/CTRS        Adam [name] 11/19/2022 10:39 AM 

## 2022-11-19 NOTE — Progress Notes (Signed)
Pharmacy - Clozapine     This patient's order has been reviewed for prescribing contraindications.   Clozapine REMS enrollment Verified: yes REMS number: [id number] Enrollment date: 01/04/2022 Current Outpatient Monitoring: every week  Dose Adjustments This Admission: Clozapine 300mg  qhs since 03/19/2022  Plan: Lab submitted to clozapine [facility]S 11/19/22 Continue ANC labs every week while inpatient   11/21/22, PharmD Clinical Pharmacist 11/19/2022 8:09 AM

## 2022-11-19 NOTE — Progress Notes (Signed)
Patient is alert and oriented x 4, affect is blunted, he appears upset this shift, irritable, angry and yelling at staff, he was unwilling to interact with staff. Patient denies SI/HI/AVH, but noted anxious he was offered emotional support. Patient complaint with medication after some cues, 15 minutes safety checks maintained will continue to monitor.

## 2022-11-19 NOTE — Progress Notes (Signed)
[facility] MD Progress Note  11/19/2022 3:04 PM Adam [name]  MRN:  [id number] Subjective: No change.  Schizophrenia.  Speech impairment.  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)   Stroke (HCC)   HTN (hypertension)   HLD (hyperlipidemia)   CAD (coronary artery disease)   Chronic systolic CHF (congestive heart failure) (HCC)   Anxiety   Infection of left earlobe  Total Time spent with patient: 20 minutes  Past Psychiatric History: Schizophrenia  Past Medical History:  Past Medical History:  Diagnosis Date   Myocardial infarction (HCC)    Schizophrenia (HCC)    Stroke (HCC)     Past Surgical History:  Procedure Laterality Date   NO PAST SURGERIES     Family History:  Family History  Problem Relation Age of Onset   Hypertension Mother    Family Psychiatric  History: Everything completely stable no change Social History:  Social History   Substance and Sexual Activity  Alcohol Use Not Currently     Social History   Substance and Sexual Activity  Drug Use Not Currently    Social History   Socioeconomic History   Marital status: Single    Spouse name: Not on file   Number of children: Not on file   Years of education: Not on file   Highest education level: Not on file  Occupational History   Not on file  Tobacco Use   Smoking status: Never    Passive exposure: Never   Smokeless tobacco: Never  Vaping Use   Vaping Use: Unknown  Substance and Sexual Activity   Alcohol use: Not Currently   Drug use: Not Currently   Sexual activity: Not Currently  Other Topics Concern   Not on file  Social History Narrative   Not on file   Social Determinants of Health   Financial Resource Strain: Not on file  Food  Insecurity: Not on file  Transportation Needs: Not on file  Physical Activity: Not on file  Stress: Not on file  Social Connections: Not on file   Additional Social History:  Specify valuables returned:  (none)                      Sleep: Fair  Appetite:  Fair  Current Medications: Current Facility-Administered Medications  Medication Dose Route Frequency Provider Last Rate Last Admin   acetaminophen (TYLENOL) tablet 650 mg  650 mg Oral Q6H PRN Rhylee Nunn T, MD   650 mg at 11/15/22 1239   alum & mag hydroxide-simeth (MAALOX/MYLANTA) 200-200-20 MG/5ML suspension 30 mL  30 mL Oral Q4H PRN Laqueisha Catalina T, MD   30 mL at 04/26/22 4259   aspirin EC tablet 81 mg  81 mg Oral Daily Marguetta Windish T, MD   81 mg at 11/19/22 1014   atorvastatin (LIPITOR) tablet 20 mg  20 mg Oral Daily Cyra Spader T, MD   20 mg at 11/19/22 1014   atropine 1 % ophthalmic solution 1 drop  1 drop Sublingual QID [name], MD   1 drop at 11/19/22 1326   benztropine (COGENTIN) tablet 0.5 mg  0.5 mg Oral BID Holdan Stucke T, MD   0.5 mg at 11/19/22 1014   clonazePAM (KLONOPIN) tablet 1 mg  1 mg Oral TID PRN He, [name], MD  1 mg at 11/18/22 2134   cloZAPine (CLOZARIL) tablet 300 mg  300 mg Oral QHS Marvine Encalade, [name], MD   300 mg at 11/18/22 2133   diphenhydrAMINE (BENADRYL) capsule 50 mg  50 mg Oral Q6H PRN Alverda Nazzaro, [name], MD   50 mg at 11/17/22 [id number]   Or   diphenhydrAMINE (BENADRYL) injection 50 mg  50 mg Intramuscular Q6H PRN Jerick Khachatryan, [name], MD       feeding supplement (ENSURE ENLIVE / ENSURE PLUS) liquid 237 mL  237 mL Oral BID BM Tyner Codner T, MD   237 mL at 11/19/22 1016   gabapentin (NEURONTIN) capsule 300 mg  300 mg Oral TID Sigifredo Pignato, [name], MD   300 mg at 11/19/22 1443   haloperidol (HALDOL) tablet 2 mg  2 mg Oral TID Graydon Fofana, [name], MD   2 mg at 11/19/22 1444   haloperidol (HALDOL) tablet 5 mg  5 mg Oral Q6H PRN Aslyn Cottman, [name], MD   5 mg at 11/17/22 [id number]   Or   haloperidol lactate (HALDOL)  injection 5 mg  5 mg Intramuscular Q6H PRN Hayde Kilgour, [name], MD       lisinopril (ZESTRIL) tablet 5 mg  5 mg Oral Daily Louie Meaders T, MD   5 mg at 11/19/22 1014   magnesium hydroxide (MILK OF MAGNESIA) suspension 30 mL  30 mL Oral Daily PRN Clemmie Buelna, [name], MD   30 mL at 11/03/22 0803   metoprolol succinate (TOPROL-XL) 24 hr tablet 25 mg  25 mg Oral Daily Ndea Kilroy T, MD   25 mg at 11/19/22 1013   ondansetron (ZOFRAN) tablet 4 mg  4 mg Oral Q8H PRN Deloria Lair, NP   4 mg at 09/06/22 2320   paliperidone (INVEGA SUSTENNA) injection 156 mg  156 mg Intramuscular Q28 days Tyjah Hai, [name], MD   156 mg at 10/23/22 1715   paliperidone (INVEGA) 24 hr tablet 6 mg  6 mg Oral QHS Joshva Labreck T, MD   6 mg at 11/18/22 2133   senna-docusate (Senokot-S) tablet 2 tablet  2 tablet Oral QHS PRN [name], DO   2 tablet at 09/15/22 2110   temazepam (RESTORIL) capsule 15 mg  15 mg Oral QHS Verdia Bolt T, MD   15 mg at 11/18/22 2133   ziprasidone (GEODON) injection 20 mg  20 mg Intramuscular Q12H PRN Gerrett Loman, [name], MD   20 mg at 07/08/22 1453    Lab Results:  Results for orders placed or performed during the hospital encounter of 03/19/22 (from the past 48 hour(s))  CBC with Differential/Platelet     Status: None   Collection Time: 11/18/22  2:52 PM  Result Value Ref Range   WBC 4.5 4.0 - 10.5 K/uL   RBC 4.33 4.22 - 5.81 MIL/uL   Hemoglobin 13.6 13.0 - 17.0 g/dL   HCT 39.0 39.0 - 52.0 %   MCV 90.1 80.0 - 100.0 fL   MCH 31.4 26.0 - 34.0 pg   MCHC 34.9 30.0 - 36.0 g/dL   RDW 12.4 11.5 - 15.5 %   Platelets 182 150 - 400 K/uL   nRBC 0.0 0.0 - 0.2 %   Neutrophils Relative % 65 %   Neutro Abs 2.9 1.7 - 7.7 K/uL   Lymphocytes Relative 22 %   Lymphs Abs 1.0 0.7 - 4.0 K/uL   Monocytes Relative 11 %   Monocytes Absolute 0.5 0.1 - 1.0 K/uL   Eosinophils Relative 0 %  Eosinophils Absolute 0.0 0.0 - 0.5 K/uL   Basophils Relative 2 %   Basophils Absolute 0.1 0.0 - 0.1 K/uL   Immature  Granulocytes 0 %   Abs Immature Granulocytes 0.01 0.00 - 0.07 K/uL    Comment: Performed at [facility], [address] [place], [place] [address]    Blood Alcohol level:  Lab Results  Component Value Date   [facility] <10 07/20/2021    Metabolic Disorder Labs: Lab Results  Component Value Date   HGBA1C 5.5 04/10/2022   MPG 111.15 04/10/2022   No results found for: "PROLACTIN" Lab Results  Component Value Date   CHOL 167 11/20/2021   TRIG 107 11/20/2021   HDL 26 (L) 11/20/2021   CHOLHDL 6.4 11/20/2021   VLDL 21 11/20/2021   LDLCALC 120 (H) 11/20/2021    Physical Findings: AIMS: Facial and Oral Movements Muscles of Facial Expression: None, normal Lips and Perioral Area: None, normal Jaw: None, normal Tongue: None, normal,Extremity Movements Upper (arms, wrists, hands, fingers): None, normal Lower (legs, knees, ankles, toes): None, normal, Trunk Movements Neck, shoulders, hips: None, normal, Overall Severity Severity of abnormal movements (highest score from questions above): None, normal Incapacitation due to abnormal movements: None, normal Patient's awareness of abnormal movements (rate only patient's report): No Awareness, Dental Status Current problems with teeth and/or dentures?: No Does patient usually wear dentures?: No  CIWA:    COWS:     Musculoskeletal: Strength & Muscle Tone: within normal limits Gait & Station: normal Patient leans: N/A  Psychiatric Specialty Exam:  Presentation  General Appearance:  Appropriate for Environment; Casual; Neat; Well Groomed  Eye Contact: Fair  Speech: Slow  Speech Volume: Decreased  Handedness: Left   Mood and Affect  Mood: Anxious  Affect: Congruent   Thought Process  Thought Processes: Goal Directed  Descriptions of Associations:Circumstantial  Orientation:Partial  Thought Content:Scattered  History of Schizophrenia/Schizoaffective disorder:Yes  Duration of Psychotic  Symptoms:Greater than six months  Hallucinations:No data recorded Ideas of Reference:None  Suicidal Thoughts:No data recorded Homicidal Thoughts:No data recorded  Sensorium  Memory: Remote Good  Judgment: Fair  Insight: Fair   Art therapist  Concentration: Fair  Attention Span: Fair  Recall: Fair  Fund of Knowledge: Fair  Language: Fair   Psychomotor Activity  Psychomotor Activity:No data recorded  Assets  Assets:No data recorded  Sleep  Sleep:No data recorded   Physical Exam: Physical Exam Vitals reviewed.  Constitutional:      Appearance: Normal appearance.  HENT:     Head: Normocephalic and atraumatic.     Mouth/Throat:     Pharynx: Oropharynx is clear.  Eyes:     Pupils: Pupils are equal, round, and reactive to light.  Cardiovascular:     Rate and Rhythm: Normal rate and regular rhythm.  Pulmonary:     Effort: Pulmonary effort is normal.     Breath sounds: Normal breath sounds.  Abdominal:     General: Abdomen is flat.     Palpations: Abdomen is soft.  Musculoskeletal:        General: Normal range of motion.  Skin:    General: Skin is warm and dry.  Neurological:     General: No focal deficit present.     Mental Status: He is alert. Mental status is at baseline.  Psychiatric:        Attention and Perception: Attention normal.        Mood and Affect: Mood normal.        Speech: He is  noncommunicative.    Review of Systems  Unable to perform ROS: Language  Cardiovascular: Negative.    Blood pressure 123/78, pulse (!) 120, temperature 98.6 F (37 C), temperature source Oral, resp. rate 20, height 5\' 8"  (1.727 m), weight 85.8 kg, SpO2 99 %. Body mass index is 28.76 kg/m.   Treatment Plan Summary: Plan no change to presentation.  No change to treatment plan.  Daily supportive encouragement.  Still waiting on placement.  Alethia Berthold, MD 11/19/2022, 3:04 PM

## 2022-11-19 NOTE — Group Note (Signed)
LCSW Group Therapy Note  Group Date: 11/19/2022 Start Time: 1300 End Time: 1400   Type of Therapy and Topic:  Group Therapy - How To Cope with Nervousness about Discharge   Participation Level:  Did Not Attend   Description of Group This process group involved identification of patients' feelings about discharge. Some of them are scheduled to be discharged soon, while others are new admissions, but each of them was asked to share thoughts and feelings surrounding discharge from the hospital. One common theme was that they are excited at the prospect of going home, while another was that many of them are apprehensive about sharing why they were hospitalized. Patients were given the opportunity to discuss these feelings with their peers in preparation for discharge.  Therapeutic Goals  Patient will identify their overall feelings about pending discharge. Patient will think about how they might proactively address issues that they believe will once again arise once they get home (i.e. with parents). Patients will participate in discussion about having hope for change.   Summary of Patient Progress:   Patient did not attend group despite encouraged participation.   Therapeutic Modalities Cognitive Behavioral Therapy   Jeanne Diefendorf W Jenya Putz, LCSWA 11/19/2022  2:28 PM   

## 2022-11-19 NOTE — Plan of Care (Signed)
D: Patient alert and oriented. Patient denies pain. Patient denies anxiety and depression. Patient denies SI/HI/AVH.  A: Scheduled medications administered to patient, per MD orders.  Support and encouragement provided to patient.  Q15 minute safety checks maintained.   R: Patient compliant with medication administration and treatment plan. No adverse drug reactions noted. Patient remains safe on the unit at this time. Problem: Education: Goal: Knowledge of General Education information will improve Description: Including pain rating scale, medication(s)/side effects and non-pharmacologic comfort measures Outcome: Progressing   Problem: Clinical Measurements: Goal: Ability to maintain clinical measurements within normal limits will improve Outcome: Progressing

## 2022-11-20 NOTE — Group Note (Signed)
[facility] LCSW Group Therapy Note   Group Date: 11/20/2022 Start Time: 1300 End Time: 1400  Type of Therapy/Topic:  Group Therapy:  Feelings about Diagnosis  Participation Level:  None    Description of Group:    This group will allow patients to explore their thoughts and feelings about diagnoses they have received. Patients will be guided to explore their level of understanding and acceptance of these diagnoses. Facilitator will encourage patients to process their thoughts and feelings about the reactions of others to their diagnosis, and will guide patients in identifying ways to discuss their diagnosis with significant others in their lives. This group will be process-oriented, with patients participating in exploration of their own experiences as well as giving and receiving support and challenge from other group members.   Therapeutic Goals: 1. Patient will demonstrate understanding of diagnosis as evidence by identifying two or more symptoms of the disorder:  2. Patient will be able to express two feelings regarding the diagnosis 3. Patient will demonstrate ability to communicate their needs through discussion and/or role plays  Summary of Patient Progress: Patient was present for approximately half of the group. Today he does not participate and sat quietly with his head on the table until he left.   Therapeutic Modalities:   Cognitive Behavioral Therapy Brief Therapy Feelings Identification    Glenis Smoker, LCSW

## 2022-11-20 NOTE — BHH Counselor (Signed)
CSW attempted contact with Thayer Ohm [phone]) with [facility]. Contact was unable to be established and HIPAA compliant voicemail left with contact information for follow through.   Vilma Meckel. Algis Greenhouse, MSW, LCSW, LCAS 11/20/2022 2:57 PM

## 2022-11-20 NOTE — Progress Notes (Signed)
[facility] MD Progress Note  11/20/2022 2:00 PM Adam [name]  MRN:  [id number] Subjective: No new complaints.  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)   Stroke (HCC)   HTN (hypertension)   HLD (hyperlipidemia)   CAD (coronary artery disease)   Chronic systolic CHF (congestive heart failure) (HCC)   Anxiety   Infection of left earlobe  Total Time spent with patient: 30 minutes  Past Psychiatric History: Past history of schizophrenia  Past Medical History:  Past Medical History:  Diagnosis Date   Myocardial infarction (HCC)    Schizophrenia (HCC)    Stroke (HCC)     Past Surgical History:  Procedure Laterality Date   NO PAST SURGERIES     Family History:  Family History  Problem Relation Age of Onset   Hypertension Mother    Family Psychiatric  History: See previous Social History:  Social History   Substance and Sexual Activity  Alcohol Use Not Currently     Social History   Substance and Sexual Activity  Drug Use Not Currently    Social History   Socioeconomic History   Marital status: Single    Spouse name: Not on file   Number of children: Not on file   Years of education: Not on file   Highest education level: Not on file  Occupational History   Not on file  Tobacco Use   Smoking status: Never    Passive exposure: Never   Smokeless tobacco: Never  Vaping Use   Vaping Use: Unknown  Substance and Sexual Activity   Alcohol use: Not Currently   Drug use: Not Currently   Sexual activity: Not Currently  Other Topics Concern   Not on file  Social History Narrative   Not on file   Social Determinants of Health   Financial Resource  Strain: Not on file  Food Insecurity: Not on file  Transportation Needs: Not on file  Physical Activity: Not on file  Stress: Not on file  Social Connections: Not on file   Additional Social History:  Specify valuables returned:  (none)                      Sleep: Fair  Appetite:  Fair  Current Medications: Current Facility-Administered Medications  Medication Dose Route Frequency Provider Last Rate Last Admin   acetaminophen (TYLENOL) tablet 650 mg  650 mg Oral Q6H PRN Jamika Sadek T, MD   650 mg at 11/15/22 1239   alum & mag hydroxide-simeth (MAALOX/MYLANTA) 200-200-20 MG/5ML suspension 30 mL  30 mL Oral Q4H PRN Eular Panek T, MD   30 mL at 04/26/22 6063   aspirin EC tablet 81 mg  81 mg Oral Daily Scarlet Abad T, MD   81 mg at 11/20/22 1054   atorvastatin (LIPITOR) tablet 20 mg  20 mg Oral Daily Dayvian Blixt T, MD   20 mg at 11/20/22 1055   atropine 1 % ophthalmic solution 1 drop  1 drop Sublingual QID [name], MD   1 drop at 11/20/22 1254   benztropine (COGENTIN) tablet 0.5 mg  0.5 mg Oral BID Saylah Ketner T, MD   0.5 mg at 11/20/22 1054   clonazePAM (KLONOPIN) tablet 1 mg  1 mg Oral TID PRN He, [name], MD   1 mg at 11/18/22 2134   cloZAPine (CLOZARIL) tablet 300 mg  300 mg Oral QHS Eiliana Drone, [name], MD   300 mg at 11/19/22 2119   diphenhydrAMINE (BENADRYL) capsule 50 mg  50 mg Oral Q6H PRN Dashel Goines, [name], MD   50 mg at 11/17/22 9826   Or   diphenhydrAMINE (BENADRYL) injection 50 mg  50 mg Intramuscular Q6H PRN Maksymilian Mabey, [name], MD       feeding supplement (ENSURE ENLIVE / ENSURE PLUS) liquid 237 mL  237 mL Oral BID BM Vern Prestia T, MD   237 mL at 11/19/22 1016   gabapentin (NEURONTIN) capsule 300 mg  300 mg Oral TID Kaelie Henigan, [name], MD   300 mg at 11/20/22 1055   haloperidol (HALDOL) tablet 2 mg  2 mg Oral TID Doralyn Kirkes, [name], MD   2 mg at 11/20/22 1055   haloperidol (HALDOL) tablet 5 mg  5 mg Oral Q6H PRN Mavrick Mcquigg, [name], MD   5 mg at 11/17/22 4158   Or    haloperidol lactate (HALDOL) injection 5 mg  5 mg Intramuscular Q6H PRN Jacoya Bauman, [name], MD       lisinopril (ZESTRIL) tablet 5 mg  5 mg Oral Daily Rakwon Letourneau T, MD   5 mg at 11/20/22 1054   magnesium hydroxide (MILK OF MAGNESIA) suspension 30 mL  30 mL Oral Daily PRN Li Fragoso, [name], MD   30 mL at 11/03/22 0803   metoprolol succinate (TOPROL-XL) 24 hr tablet 25 mg  25 mg Oral Daily Jerricka Carvey T, MD   25 mg at 11/20/22 1055   ondansetron (ZOFRAN) tablet 4 mg  4 mg Oral Q8H PRN Jearld Lesch, NP   4 mg at 09/06/22 2320   paliperidone (INVEGA SUSTENNA) injection 156 mg  156 mg Intramuscular Q28 days Jeniffer Culliver, [name], MD   156 mg at 10/23/22 1715   paliperidone (INVEGA) 24 hr tablet 6 mg  6 mg Oral QHS Winni Ehrhard T, MD   6 mg at 11/19/22 2120   senna-docusate (Senokot-S) tablet 2 tablet  2 tablet Oral QHS PRN Sarina Ill, DO   2 tablet at 09/15/22 2110   temazepam (RESTORIL) capsule 15 mg  15 mg Oral QHS Chelsie Burel T, MD   15 mg at 11/19/22 2120   ziprasidone (GEODON) injection 20 mg  20 mg Intramuscular Q12H PRN Rily Nickey, [name], MD   20 mg at 07/08/22 1453    Lab Results:  Results for orders placed or performed during the hospital encounter of 03/19/22 (from the past 48 hour(s))  CBC with Differential/Platelet     Status: None   Collection Time: 11/18/22  2:52 PM  Result Value Ref Range   WBC 4.5 4.0 - 10.5 K/uL   RBC 4.33 4.22 - 5.81 MIL/uL   Hemoglobin 13.6 13.0 - 17.0 g/dL   HCT 30.9 40.7 - 68.0 %   MCV 90.1 80.0 - 100.0 fL   MCH 31.4 26.0 - 34.0 pg   MCHC 34.9 30.0 - 36.0 g/dL   RDW 88.1 10.3 - 15.9 %   Platelets 182 150 - 400 K/uL   nRBC 0.0 0.0 - 0.2 %   Neutrophils Relative % 65 %   Neutro Abs 2.9 1.7 - 7.7 K/uL   Lymphocytes Relative 22 %   Lymphs Abs 1.0 0.7 - 4.0 K/uL   Monocytes Relative 11 %   Monocytes Absolute 0.5  0.1 - 1.0 K/uL   Eosinophils Relative 0 %   Eosinophils Absolute 0.0 0.0 - 0.5 K/uL   Basophils Relative 2 %   Basophils Absolute  0.1 0.0 - 0.1 K/uL   Immature Granulocytes 0 %   Abs Immature Granulocytes 0.01 0.00 - 0.07 K/uL    Comment: Performed at [facility], [address] [place], [place] [address]    Blood Alcohol level:  Lab Results  Component Value Date   [facility] [place] [facility] <10 07/20/2021    Metabolic Disorder Labs: Lab Results  Component Value Date   HGBA1C 5.5 04/10/2022   MPG 111.15 04/10/2022   No results found for: "PROLACTIN" Lab Results  Component Value Date   CHOL 167 11/20/2021   TRIG 107 11/20/2021   HDL 26 (L) 11/20/2021   CHOLHDL 6.4 11/20/2021   VLDL 21 11/20/2021   LDLCALC 120 (H) 11/20/2021    Physical Findings: AIMS: Facial and Oral Movements Muscles of Facial Expression: None, normal Lips and Perioral Area: None, normal Jaw: None, normal Tongue: None, normal,Extremity Movements Upper (arms, wrists, hands, fingers): None, normal Lower (legs, knees, ankles, toes): None, normal, Trunk Movements Neck, shoulders, hips: None, normal, Overall Severity Severity of abnormal movements (highest score from questions above): None, normal Incapacitation due to abnormal movements: None, normal Patient's awareness of abnormal movements (rate only patient's report): No Awareness, Dental Status Current problems with teeth and/or dentures?: No Does patient usually wear dentures?: No  CIWA:    COWS:     Musculoskeletal: Strength & Muscle Tone: within normal limits Gait & Station: normal Patient leans: N/A  Psychiatric Specialty Exam:  Presentation  General Appearance:  Appropriate for Environment; Casual; Neat; Well Groomed  Eye Contact: Fair  Speech: Slow  Speech Volume: Decreased  Handedness: Left   Mood and Affect  Mood: Anxious  Affect: Congruent   Thought Process  Thought Processes: Goal Directed  Descriptions of Associations:Circumstantial  Orientation:Partial  Thought Content:Scattered  History of Schizophrenia/Schizoaffective  disorder:Yes  Duration of Psychotic Symptoms:Greater than six months  Hallucinations:No data recorded Ideas of Reference:None  Suicidal Thoughts:No data recorded Homicidal Thoughts:No data recorded  Sensorium  Memory: Remote Good  Judgment: Fair  Insight: Fair   Art therapist  Concentration: Fair  Attention Span: Fair  Recall: [employer] of Knowledge: Fair  Language: Fair   Psychomotor Activity  Psychomotor Activity:No data recorded  Assets  Assets:No data recorded  Sleep  Sleep:No data recorded   Physical Exam: Physical Exam Vitals and nursing note reviewed.  Constitutional:      Appearance: Normal appearance.  HENT:     Head: Normocephalic and atraumatic.     Mouth/Throat:     Pharynx: Oropharynx is clear.  Eyes:     Pupils: Pupils are equal, round, and reactive to light.  Cardiovascular:     Rate and Rhythm: Normal rate and regular rhythm.  Pulmonary:     Effort: Pulmonary effort is normal.     Breath sounds: Normal breath sounds.  Abdominal:     General: Abdomen is flat.     Palpations: Abdomen is soft.  Musculoskeletal:        General: Normal range of motion.  Skin:    General: Skin is warm and dry.  Neurological:     General: No focal deficit present.     Mental Status: He is alert. Mental status is at baseline.  Psychiatric:        Mood and Affect: Mood normal.  Thought Content: Thought content normal.    Review of Systems  Unable to perform ROS: Language   Blood pressure 127/84, pulse 89, temperature 98.6 F (37 C), temperature source Oral, resp. rate 20, height 5\' 8"  (1.727 m), weight 85.8 kg, SpO2 99 %. Body mass index is 28.76 kg/m.   Treatment Plan Summary: Plan no change to medication or treatment plan.  Continues to be very stable and ready for discharge planning  , MD 11/20/2022, 2:00 PM

## 2022-11-20 NOTE — Plan of Care (Signed)
  Problem: Education: Goal: Will be free of psychotic symptoms Outcome: Progressing   Problem: Coping: Goal: Coping ability will improve Outcome: Progressing   Problem: Activity: Goal: Interest or engagement in leisure activities will improve Outcome: Progressing  Patient compliant with treatment plan. Interacting well with Peers and staff. Support and encouragement ongoing without self harm gestures.

## 2022-11-20 NOTE — Progress Notes (Signed)
Recreation Therapy Notes   Date: 11/20/2022  Time: 10:05 am    Location: Craft room     Behavioral response: N/A   Intervention Topic: Teamwork    Discussion/Intervention: Patient refused to attend group.   Clinical Observations/Feedback:  Patient refused to attend group.    Yolandra Habig LRT/CTRS        Adam [name] 11/20/2022 11:10 AM 

## 2022-11-20 NOTE — Plan of Care (Signed)
D: Patient alert and oriented. Patient denies pain. Patient denies anxiety and depression. Patient denies SI/HI/AVH. Monthly Invega injection administered to patient.  A: Scheduled medications administered to patient, per MD orders.  Support and encouragement provided to patient.  Q15 minute safety checks maintained.   R: Patient compliant with medication administration and treatment plan. No adverse drug reactions noted. Patient remains safe on the unit at this time. Problem: Education: Goal: Knowledge of General Education information will improve Description: Including pain rating scale, medication(s)/side effects and non-pharmacologic comfort measures Outcome: Progressing   Problem: Clinical Measurements: Goal: Ability to maintain clinical measurements within normal limits will improve Outcome: Progressing   Problem: Activity: Goal: Interest or engagement in leisure activities will improve Outcome: Progressing   Problem: Education: Goal: Will be free of psychotic symptoms Outcome: Progressing

## 2022-11-21 NOTE — Progress Notes (Signed)
D: Pt agitated witnessed yelling "richie" on the unit. Pt spit night meds out in the [employer] can. PRN Klonopin, Benadryl, and Haldol administered. Medication was effective. No further concerns at this time.  A: Scheduled medications administered to pt, per MD orders. Support and encouragement provided. Frequent verbal contact made. Routine safety checks conducted q15 minutes.   R: No adverse drug reactions noted. Pt verbally contracts for safety at this time. Pt complaint with medications and treatment plan. Pt interacts well with others on the unit. Pt remains safe at this time. Will continue to monitor.

## 2022-11-21 NOTE — Progress Notes (Signed)
Active on the unit. Had to be redirected several times today. Hyper focused on his vent in his room.  Appears to be some type of device. Item was shown to maintenance worker, who did not know what the item was. Otherwise Adam [name] remains unchanged. Med compliant. No behavior issues or outburst so far this shift. Will continue to  monitor.  Q 25 minute safety checks in place.   C Butler-Nicholson, LPN

## 2022-11-21 NOTE — [facility] Counselor (Signed)
CSW team met with [facility] (L. Oxendine and Carmine Savoy), [facility] service line leadership Loleta Dicker and Elder Cyphers), representative from [facility]), and pt's mother Ardyth Harps).   Steeves was allowed to ask questions and voice concerns regarding [facility] Justina informed Shall that the staff take residents to their appointments, they participate in activities outside of the home, and they have security camera located at the front door. Justina also informed Monnier that they also participate in a day program through [facility]. [name] also inquired if pt would be allowed to use a tablet and would he have access to a tv. Charlean Merl stated that they do not provide tablets but that if pt has one then he is more than welcome to utilize it. She also stated that there is a tv in the common area where they all share this.   Oxendine asked team for an update regarding pt behaviors. She was informed that pt has had no behaviors and remains compliant with unit protocols. Justina inquired regarding how pt communicates and team updated her around these as his vocabulary has increased on the unit. She was also informed that in past he has used a Data processing manager through speech therapy but that lasted for a short time while here.   Justina stated that she would follow up with Oxendine, after reviewing the case with her team. Meeting ended without incident.   CSW team spoke with Oxendine briefly regarding [facility] (lack of contact). She asked if someone could follow up with TOC supervisor, Z. Brooks regarding attempts to Development worker, international aid of the neurocognitive facilities. CSW agreed. Oxendine to contact Justina from [facility] to follow up. No other concerns expressed. Contact ended without incident.   [name]. Guerry Bruin, MSW, LCSW, [place] 11/21/2022 1:55 PM

## 2022-11-21 NOTE — Plan of Care (Signed)
  Problem: Education: Goal: Ability to state activities that reduce stress will improve Outcome: Progressing   Problem: Coping: Goal: Ability to identify and develop effective coping behavior will improve Outcome: Progressing   Problem: Self-Concept: Goal: Ability to identify factors that promote anxiety will improve Outcome: Progressing Goal: Level of anxiety will decrease Outcome: Progressing Goal: Ability to modify response to factors that promote anxiety will improve Outcome: Progressing   Problem: Education: Goal: Knowledge of General Education information will improve Description: Including pain rating scale, medication(s)/side effects and non-pharmacologic comfort measures Outcome: Progressing   Problem: Education: Goal: Will be free of psychotic symptoms Outcome: Progressing   Problem: Coping: Goal: Coping ability will improve Outcome: Progressing   Problem: Activity: Goal: Interest or engagement in leisure activities will improve Outcome: Progressing   Problem: Skin Integrity: Goal: Risk for impaired skin integrity will decrease Outcome: Progressing   Problem: Safety: Goal: Ability to remain free from injury will improve Outcome: Progressing

## 2022-11-21 NOTE — Group Note (Signed)
[facility] LCSW Group Therapy Note   Group Date: 11/21/2022 Start Time: 1300 End Time: 1400   Type of Therapy/Topic:  Group Therapy:  Emotion Regulation  Participation Level:  None   Mood:  Description of Group:    The purpose of this group is to assist patients in learning to regulate negative emotions and experience positive emotions. Patients will be guided to discuss ways in which they have been vulnerable to their negative emotions. These vulnerabilities will be juxtaposed with experiences of positive emotions or situations, and patients challenged to use positive emotions to combat negative ones. Special emphasis will be placed on coping with negative emotions in conflict situations, and patients will process healthy conflict resolution skills.  Therapeutic Goals: Patient will identify two positive emotions or experiences to reflect on in order to balance out negative emotions:  Patient will label two or more emotions that they find the most difficult to experience:  Patient will be able to demonstrate positive conflict resolution skills through discussion or role plays:   Summary of Patient Progress:   Patient came and left group periodically, unable to participate in group due to effects related to aphasia.     Therapeutic Modalities:   Cognitive Behavioral Therapy Feelings Identification Dialectical Behavioral Therapy   Corky Crafts, [place]

## 2022-11-21 NOTE — Progress Notes (Signed)
Recreation Therapy Notes  Date: 11/21/2022   Time: 10:20 am     Location: Craft room      Behavioral response: N/A   Intervention Topic: Wellness    Discussion/Intervention: Patient refused to attend group.    Clinical Observations/Feedback:  Patient refused to attend group.    Makalynn Berwanger LRT/CTRS          Caedence Snowden 11/21/2022 11:12 AM

## 2022-11-21 NOTE — Progress Notes (Signed)
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)   Stroke (HCC)   HTN (hypertension)   HLD (hyperlipidemia)   CAD (coronary artery disease)   Chronic systolic CHF (congestive heart failure) (HCC)   Anxiety   Infection of left earlobe  Total Time spent with patient: 20 minutes  Past Psychiatric History: Past history of schizophrenia  Past Medical History:  Past Medical History:  Diagnosis Date   Myocardial infarction (HCC)    Schizophrenia (HCC)    Stroke (HCC)     Past Surgical History:  Procedure Laterality Date   NO PAST SURGERIES     Family History:  Family History  Problem Relation Age of Onset   Hypertension Mother    Family Psychiatric  History: See previous Social History:  Social History   Substance and Sexual Activity  Alcohol Use Not Currently     Social History   Substance and Sexual Activity  Drug Use Not Currently    Social History   Socioeconomic History   Marital status: Single    Spouse name: Not on file   Number of children: Not on file   Years of education: Not on file   Highest education level: Not on file  Occupational History   Not on file  Tobacco Use   Smoking status: Never    Passive exposure: Never   Smokeless tobacco: Never  Vaping Use   Vaping Use: Unknown  Substance and Sexual Activity   Alcohol use: Not Currently   Drug use: Not Currently   Sexual activity: Not Currently  Other Topics Concern   Not on file  Social History Narrative   Not on file   Social Determinants of Health   Financial Resource Strain: Not on file  Food Insecurity: Not on file  Transportation Needs: Not  on file  Physical Activity: Not on file  Stress: Not on file  Social Connections: Not on file   Additional Social History:  Specify valuables returned:  (none)                      Sleep: Fair  Appetite:  Fair  Current Medications: Current Facility-Administered Medications  Medication Dose Route Frequency Provider Last Rate Last Admin   acetaminophen (TYLENOL) tablet 650 mg  650 mg Oral Q6H PRN Willis Holquin T, MD   650 mg at 11/15/22 1239   alum & mag hydroxide-simeth (MAALOX/MYLANTA) 200-200-20 MG/5ML suspension 30 mL  30 mL Oral Q4H PRN Taijuan Serviss T, MD   30 mL at 04/26/22 4098   aspirin EC tablet 81 mg  81 mg Oral Daily Kiyanna Biegler T, MD   81 mg at 11/21/22 0900   atorvastatin (LIPITOR) tablet 20 mg  20 mg Oral Daily Syrena Burges T, MD   20 mg at 11/21/22 0900   atropine 1 % ophthalmic solution 1 drop  1 drop Sublingual QID [name], MD   1 drop at 11/21/22 1236   benztropine (COGENTIN) tablet 0.5 mg  0.5 mg Oral BID Geanette Buonocore T, MD   0.5 mg at 11/21/22 0900   clonazePAM (KLONOPIN) tablet 1 mg  1 mg Oral TID PRN He, [name], MD   1 mg at 11/18/22 2134  cloZAPine (CLOZARIL) tablet 300 mg  300 mg Oral QHS Audiel Scheiber T, MD   300 mg at 11/20/22 2124   diphenhydrAMINE (BENADRYL) capsule 50 mg  50 mg Oral Q6H PRN Delmo Matty T, MD   50 mg at 11/17/22 1638   Or   diphenhydrAMINE (BENADRYL) injection 50 mg  50 mg Intramuscular Q6H PRN Zael Shuman T, MD       feeding supplement (ENSURE ENLIVE / ENSURE PLUS) liquid 237 mL  237 mL Oral BID BM Addy Mcmannis T, MD   237 mL at 11/21/22 1437   gabapentin (NEURONTIN) capsule 300 mg  300 mg Oral TID Nuh Lipton T, MD   300 mg at 11/21/22 1437   haloperidol (HALDOL) tablet 2 mg  2 mg Oral TID Tacori Kvamme T, MD   2 mg at 11/21/22 1436   haloperidol (HALDOL) tablet 5 mg  5 mg Oral Q6H PRN Vladislav Axelson T, MD   5 mg at 11/17/22 4536   Or   haloperidol lactate (HALDOL) injection 5 mg  5 mg Intramuscular Q6H PRN Khup Sapia,  Wilhelmenia Addis T, MD       lisinopril (ZESTRIL) tablet 5 mg  5 mg Oral Daily Aryam Zhan T, MD   5 mg at 11/21/22 0900   magnesium hydroxide (MILK OF MAGNESIA) suspension 30 mL  30 mL Oral Daily PRN Fahmida Jurich T, MD   30 mL at 11/03/22 0803   metoprolol succinate (TOPROL-XL) 24 hr tablet 25 mg  25 mg Oral Daily Yides Saidi T, MD   25 mg at 11/21/22 0900   ondansetron (ZOFRAN) tablet 4 mg  4 mg Oral Q8H PRN Jearld Lesch, NP   4 mg at 09/06/22 2320   paliperidone (INVEGA SUSTENNA) injection 156 mg  156 mg Intramuscular Q28 days Maddy Graham T, MD   156 mg at 11/20/22 1741   paliperidone (INVEGA) 24 hr tablet 6 mg  6 mg Oral QHS Amaranta Mehl T, MD   6 mg at 11/20/22 2124   senna-docusate (Senokot-S) tablet 2 tablet  2 tablet Oral QHS PRN Sarina Ill, DO   2 tablet at 09/15/22 2110   temazepam (RESTORIL) capsule 15 mg  15 mg Oral QHS Marshaun Lortie T, MD   15 mg at 11/20/22 2125   ziprasidone (GEODON) injection 20 mg  20 mg Intramuscular Q12H PRN Marx Doig, [name], MD   20 mg at 07/08/22 1453    Lab Results: No results found for this or any previous visit (from the past 48 hour(s)).  Blood Alcohol level:  Lab Results  Component Value Date   ETH <10 07/20/2021    Metabolic Disorder Labs: Lab Results  Component Value Date   HGBA1C 5.5 04/10/2022   MPG 111.15 04/10/2022   No results found for: "PROLACTIN" Lab Results  Component Value Date   CHOL 167 11/20/2021   TRIG 107 11/20/2021   HDL 26 (L) 11/20/2021   CHOLHDL 6.4 11/20/2021   VLDL 21 11/20/2021   LDLCALC 120 (H) 11/20/2021    Physical Findings: AIMS: Facial and Oral Movements Muscles of Facial Expression: None, normal Lips and Perioral Area: None, normal Jaw: None, normal Tongue: None, normal,Extremity Movements Upper (arms, wrists, hands, fingers): None, normal Lower (legs, knees, ankles, toes): None, normal, Trunk Movements Neck, shoulders, hips: None, normal, Overall Severity Severity of abnormal movements  (highest score from questions above): None, normal Incapacitation due to abnormal movements: None, normal Patient's awareness of abnormal movements (rate only patient's report): No Awareness, Dental  Status Current problems with teeth and/or dentures?: No Does patient usually wear dentures?: No  CIWA:    COWS:     Musculoskeletal: Strength & Muscle Tone: within normal limits Gait & Station: normal Patient leans: N/A  Psychiatric Specialty Exam:  Presentation  General Appearance:  Appropriate for Environment; Casual; Neat; Well Groomed  Eye Contact: Fair  Speech: Slow  Speech Volume: Decreased  Handedness: Left   Mood and Affect  Mood: Anxious  Affect: Congruent   Thought Process  Thought Processes: Goal Directed  Descriptions of Associations:Circumstantial  Orientation:Partial  Thought Content:Scattered  History of Schizophrenia/Schizoaffective disorder:Yes  Duration of Psychotic Symptoms:Greater than six months  Hallucinations:No data recorded Ideas of Reference:None  Suicidal Thoughts:No data recorded Homicidal Thoughts:No data recorded  Sensorium  Memory: Remote Good  Judgment: Fair  Insight: Fair   Art therapist  Concentration: Fair  Attention Span: Fair  Recall: Fair  Fund of Knowledge: Fair  Language: Fair   Psychomotor Activity  Psychomotor Activity:No data recorded  Assets  Assets:No data recorded  Sleep  Sleep:No data recorded   Physical Exam: Physical Exam Vitals reviewed.  Constitutional:      Appearance: Normal appearance.  HENT:     Head: Normocephalic and atraumatic.     Mouth/Throat:     Pharynx: Oropharynx is clear.  Eyes:     Pupils: Pupils are equal, round, and reactive to light.  Cardiovascular:     Rate and Rhythm: Normal rate and regular rhythm.  Pulmonary:     Effort: Pulmonary effort is normal.     Breath sounds: Normal breath sounds.  Abdominal:     General: Abdomen is flat.      Palpations: Abdomen is soft.  Musculoskeletal:        General: Normal range of motion.  Skin:    General: Skin is warm and dry.  Neurological:     General: No focal deficit present.     Mental Status: He is alert. Mental status is at baseline.  Psychiatric:        Mood and Affect: Mood normal.        Thought Content: Thought content normal.    Review of Systems  Unable to perform ROS: Language   Blood pressure 127/84, pulse 89, temperature 98.6 F (37 C), temperature source Oral, resp. rate 20, height 5\' 8"  (1.727 m), weight 85.8 kg, SpO2 99 %. Body mass index is 28.76 kg/m.   Treatment Plan Summary: Plan completely stable.  Just awaiting placement.  , MD 11/21/2022, 2:48 PM

## 2022-11-22 NOTE — Group Note (Signed)
LCSW Group Therapy Note   Group Date: 11/22/2022 Start Time: 1300 End Time: 1400   Type of Therapy and Topic:  Group Therapy: Boundaries  Participation Level:  None  Description of Group: This group will address the use of boundaries in their personal lives. Patients will explore why boundaries are important, the difference between healthy and unhealthy boundaries, and negative and postive outcomes of different boundaries and will look at how boundaries can be crossed.  Patients will be encouraged to identify current boundaries in their own lives and identify what kind of boundary is being set. Facilitators will guide patients in utilizing problem-solving interventions to address and correct types boundaries being used and to address when no boundary is being used. Understanding and applying boundaries will be explored and addressed for obtaining and maintaining a balanced life. Patients will be encouraged to explore ways to assertively make their boundaries and needs known to significant others in their lives, using other group members and facilitator for role play, support, and feedback.  Therapeutic Goals:  1.  Patient will identify areas in their life where setting clear boundaries could be  used to improve their life.  2.  Patient will identify signs/triggers that a boundary is not being respected. 3.  Patient will identify two ways to set boundaries in order to achieve balance in  their lives: 4.  Patient will demonstrate ability to communicate their needs and set boundaries  through discussion and/or role plays  Summary of Patient Progress:   Patient unable to participate in group due to latent effects of aphasia.   Therapeutic Modalities:   Cognitive Behavioral Therapy Solution-Focused Therapy  Corky Crafts, Theresia Majors 11/22/2022  3:00 PM

## 2022-11-22 NOTE — Progress Notes (Signed)
Patient cooperative on unit this morning. Patient med compliant observed for med cheeking or spitting. Patient denies pain. Patient at baseline behavior. No violent/aggressive behavior. No s/s of current distress.

## 2022-11-22 NOTE — Progress Notes (Signed)
Recreation Therapy Notes   Date: 11/22/2022  Time: 9:15 am    Location: Craft room     Behavioral response: N/A   Intervention Topic: Time Management   Discussion/Intervention: Patient refused to attend group.   Clinical Observations/Feedback:  Patient refused to attend group.    Jeana Kersting LRT/CTRS         Erum Cercone 11/22/2022 12:07 PM

## 2022-11-22 NOTE — Progress Notes (Signed)
[facility] MD Progress Note  11/22/2022 12:04 PM Adam [name]  MRN:  [id number] Subjective: Follow-up 61 year old man with schizophrenia.  No new developments today.  Behavior is stable.  No new medical issues. 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)   Stroke (HCC)   HTN (hypertension)   HLD (hyperlipidemia)   CAD (coronary artery disease)   Chronic systolic CHF (congestive heart failure) (HCC)   Anxiety   Infection of left earlobe  Total Time spent with patient: 20 minutes  Past Psychiatric History: History of schizophrenia  Past Medical History:  Past Medical History:  Diagnosis Date   Myocardial infarction (HCC)    Schizophrenia (HCC)    Stroke (HCC)     Past Surgical History:  Procedure Laterality Date   NO PAST SURGERIES     Family History:  Family History  Problem Relation Age of Onset   Hypertension Mother    Family Psychiatric  History: See previous Social History:  Social History   Substance and Sexual Activity  Alcohol Use Not Currently     Social History   Substance and Sexual Activity  Drug Use Not Currently    Social History   Socioeconomic History   Marital status: Single    Spouse name: Not on file   Number of children: Not on file   Years of education: Not on file   Highest education level: Not on file  Occupational History   Not on file  Tobacco Use   Smoking status: Never    Passive exposure: Never   Smokeless tobacco: Never  Vaping Use   Vaping Use: Unknown  Substance and Sexual Activity   Alcohol use: Not Currently   Drug use: Not Currently   Sexual activity: Not Currently  Other Topics Concern   Not on file  Social History Narrative   Not on file   Social Determinants of Health   Financial Resource  Strain: Not on file  Food Insecurity: Not on file  Transportation Needs: Not on file  Physical Activity: Not on file  Stress: Not on file  Social Connections: Not on file   Additional Social History:  Specify valuables returned:  (none)                      Sleep: Fair  Appetite:  Fair  Current Medications: Current Facility-Administered Medications  Medication Dose Route Frequency Provider Last Rate Last Admin   acetaminophen (TYLENOL) tablet 650 mg  650 mg Oral Q6H PRN Murel Wigle T, MD   650 mg at 11/15/22 1239   alum & mag hydroxide-simeth (MAALOX/MYLANTA) 200-200-20 MG/5ML suspension 30 mL  30 mL Oral Q4H PRN Mateya Torti T, MD   30 mL at 04/26/22 9767   aspirin EC tablet 81 mg  81 mg Oral Daily Hayde Kilgour, [name], MD   81 mg at 11/22/22 3419   atorvastatin (LIPITOR) tablet 20 mg  20 mg Oral Daily Bane Hagy T, MD   20 mg at 11/22/22 0833   atropine 1 % ophthalmic solution 1 drop  1 drop Sublingual QID [name], MD   1 drop at 11/22/22 0840   benztropine (COGENTIN) tablet 0.5 mg  0.5 mg Oral BID Deangelo Berns T, MD   0.5 mg at 11/22/22 0833   clonazePAM (KLONOPIN) tablet 1 mg  1 mg Oral TID PRN  He, [name], MD   1 mg at 11/21/22 2132   cloZAPine (CLOZARIL) tablet 300 mg  300 mg Oral QHS Jalicia Roszak T, MD   300 mg at 11/20/22 2124   diphenhydrAMINE (BENADRYL) capsule 50 mg  50 mg Oral Q6H PRN Rasheedah Reis T, MD   50 mg at 11/21/22 2132   Or   diphenhydrAMINE (BENADRYL) injection 50 mg  50 mg Intramuscular Q6H PRN Britt Theard T, MD       feeding supplement (ENSURE ENLIVE / ENSURE PLUS) liquid 237 mL  237 mL Oral BID BM Dayrin Stallone T, MD   237 mL at 11/21/22 1437   gabapentin (NEURONTIN) capsule 300 mg  300 mg Oral TID Elise Gladden T, MD   300 mg at 11/22/22 1037   haloperidol (HALDOL) tablet 2 mg  2 mg Oral TID Beverely Suen T, MD   2 mg at 11/22/22 1037   haloperidol (HALDOL) tablet 5 mg  5 mg Oral Q6H PRN Kalecia Hartney T, MD   5 mg at 11/21/22 2132   Or    haloperidol lactate (HALDOL) injection 5 mg  5 mg Intramuscular Q6H PRN Zonya Gudger T, MD       lisinopril (ZESTRIL) tablet 5 mg  5 mg Oral Daily Enijah Furr T, MD   5 mg at 11/22/22 4782   magnesium hydroxide (MILK OF MAGNESIA) suspension 30 mL  30 mL Oral Daily PRN Makenley Shimp T, MD   30 mL at 11/03/22 0803   metoprolol succinate (TOPROL-XL) 24 hr tablet 25 mg  25 mg Oral Daily Steffani Dionisio T, MD   25 mg at 11/22/22 0833   ondansetron (ZOFRAN) tablet 4 mg  4 mg Oral Q8H PRN Lerry Liner M, NP   4 mg at 09/06/22 2320   paliperidone (INVEGA SUSTENNA) injection 156 mg  156 mg Intramuscular Q28 days Karmah Potocki T, MD   156 mg at 11/20/22 1741   paliperidone (INVEGA) 24 hr tablet 6 mg  6 mg Oral QHS Carlei Huang T, MD   6 mg at 11/20/22 2124   senna-docusate (Senokot-S) tablet 2 tablet  2 tablet Oral QHS PRN Sarina Ill, DO   2 tablet at 09/15/22 2110   temazepam (RESTORIL) capsule 15 mg  15 mg Oral QHS Burke Terry T, MD   15 mg at 11/20/22 2125   ziprasidone (GEODON) injection 20 mg  20 mg Intramuscular Q12H PRN Quadasia Newsham, [name], MD   20 mg at 07/08/22 1453    Lab Results: No results found for this or any previous visit (from the past 48 hour(s)).  Blood Alcohol level:  Lab Results  Component Value Date   ETH <10 07/20/2021    Metabolic Disorder Labs: Lab Results  Component Value Date   HGBA1C 5.5 04/10/2022   MPG 111.15 04/10/2022   No results found for: "PROLACTIN" Lab Results  Component Value Date   CHOL 167 11/20/2021   TRIG 107 11/20/2021   HDL 26 (L) 11/20/2021   CHOLHDL 6.4 11/20/2021   VLDL 21 11/20/2021   LDLCALC 120 (H) 11/20/2021    Physical Findings: AIMS: Facial and Oral Movements Muscles of Facial Expression: None, normal Lips and Perioral Area: None, normal Jaw: None, normal Tongue: None, normal,Extremity Movements Upper (arms, wrists, hands, fingers): None, normal Lower (legs, knees, ankles, toes): None, normal, Trunk Movements Neck,  shoulders, hips: None, normal, Overall Severity Severity of abnormal movements (highest score from questions above): None, normal Incapacitation due to abnormal movements: None, normal  Patient's awareness of abnormal movements (rate only patient's report): No Awareness, Dental Status Current problems with teeth and/or dentures?: No Does patient usually wear dentures?: No  CIWA:    COWS:     Musculoskeletal: Strength & Muscle Tone: within normal limits Gait & Station: normal Patient leans: N/A  Psychiatric Specialty Exam:  Presentation  General Appearance:  Appropriate for Environment; Casual; Neat; Well Groomed  Eye Contact: Fair  Speech: Slow  Speech Volume: Decreased  Handedness: Left   Mood and Affect  Mood: Anxious  Affect: Congruent   Thought Process  Thought Processes: Goal Directed  Descriptions of Associations:Circumstantial  Orientation:Partial  Thought Content:Scattered  History of Schizophrenia/Schizoaffective disorder:Yes  Duration of Psychotic Symptoms:Greater than six months  Hallucinations:No data recorded Ideas of Reference:None  Suicidal Thoughts:No data recorded Homicidal Thoughts:No data recorded  Sensorium  Memory: Remote Good  Judgment: Fair  Insight: Fair   Art therapist  Concentration: Fair  Attention Span: Fair  Recall: Fair  Fund of Knowledge: Fair  Language: Fair   Psychomotor Activity  Psychomotor Activity:No data recorded  Assets  Assets:No data recorded  Sleep  Sleep:No data recorded   Physical Exam: Physical Exam Vitals reviewed.  Constitutional:      Appearance: Normal appearance.  HENT:     Head: Normocephalic and atraumatic.     Mouth/Throat:     Pharynx: Oropharynx is clear.  Eyes:     Pupils: Pupils are equal, round, and reactive to light.  Cardiovascular:     Rate and Rhythm: Normal rate and regular rhythm.  Pulmonary:     Effort: Pulmonary effort is normal.      Breath sounds: Normal breath sounds.  Abdominal:     General: Abdomen is flat.     Palpations: Abdomen is soft.  Musculoskeletal:        General: Normal range of motion.  Skin:    General: Skin is warm and dry.  Neurological:     General: No focal deficit present.     Mental Status: He is alert. Mental status is at baseline.  Psychiatric:        Attention and Perception: He is inattentive.        Mood and Affect: Mood normal. Affect is blunt.        Speech: He is noncommunicative.    Review of Systems  Unable to perform ROS: Language   Blood pressure 106/75, pulse 81, temperature 98 F (36.7 C), temperature source Oral, resp. rate 20, height 5\' 8"  (1.727 m), weight 85.8 kg, SpO2 100 %. Body mass index is 28.76 kg/m.   Treatment Plan Summary: Plan nothing new or different about this patient.  Still waiting for placement.  , MD 11/22/2022, 12:04 PM

## 2022-11-22 NOTE — Plan of Care (Signed)
  Problem: Safety: Goal: Ability to remain free from injury will improve Outcome: Progressing   Problem: Coping: Goal: Coping ability will improve Outcome: Progressing

## 2022-11-23 NOTE — Progress Notes (Signed)
The patient was cooperative with treatment and no new behavioral issues to report on shift at this time.

## 2022-11-23 NOTE — Progress Notes (Signed)
[facility] MD Progress Note  11/23/2022 3:20 PM Adam [name]  MRN:  [id number] Subjective: Follow-up patient with schizophrenia.  No change to overall treatment plan.  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)   Stroke (HCC)   HTN (hypertension)   HLD (hyperlipidemia)   CAD (coronary artery disease)   Chronic systolic CHF (congestive heart failure) (HCC)   Anxiety   Infection of left earlobe  Total Time spent with patient: 30 minutes  Past Psychiatric History: Past history of schizophrenia  Past Medical History:  Past Medical History:  Diagnosis Date   Myocardial infarction (HCC)    Schizophrenia (HCC)    Stroke (HCC)     Past Surgical History:  Procedure Laterality Date   NO PAST SURGERIES     Family History:  Family History  Problem Relation Age of Onset   Hypertension Mother    Family Psychiatric  History: See previous Social History:  Social History   Substance and Sexual Activity  Alcohol Use Not Currently     Social History   Substance and Sexual Activity  Drug Use Not Currently    Social History   Socioeconomic History   Marital status: Single    Spouse name: Not on file   Number of children: Not on file   Years of education: Not on file   Highest education level: Not on file  Occupational History   Not on file  Tobacco Use   Smoking status: Never    Passive exposure: Never   Smokeless tobacco: Never  Vaping Use   Vaping Use: Unknown  Substance and Sexual Activity   Alcohol use: Not Currently   Drug use: Not Currently   Sexual activity: Not Currently  Other Topics Concern   Not on file  Social History Narrative   Not on file   Social Determinants of Health   Financial Resource Strain: Not on  file  Food Insecurity: Not on file  Transportation Needs: Not on file  Physical Activity: Not on file  Stress: Not on file  Social Connections: Not on file   Additional Social History:  Specify valuables returned:  (none)                      Sleep: Fair  Appetite:  Fair  Current Medications: Current Facility-Administered Medications  Medication Dose Route Frequency Provider Last Rate Last Admin   acetaminophen (TYLENOL) tablet 650 mg  650 mg Oral Q6H PRN Ammara Raj T, MD   650 mg at 11/15/22 1239   alum & mag hydroxide-simeth (MAALOX/MYLANTA) 200-200-20 MG/5ML suspension 30 mL  30 mL Oral Q4H PRN Alonna Bartling T, MD   30 mL at 04/26/22 1275   aspirin EC tablet 81 mg  81 mg Oral Daily Aniesha Haughn, [name], MD   81 mg at 11/23/22 0913   atorvastatin (LIPITOR) tablet 20 mg  20 mg Oral Daily Alexanderjames Berg T, MD   20 mg at 11/23/22 0913   atropine 1 % ophthalmic solution 1 drop  1 drop Sublingual QID [name], MD   1 drop at 11/23/22 1215   benztropine (COGENTIN) tablet 0.5 mg  0.5 mg Oral BID Haleema Vanderheyden T, MD   0.5 mg at 11/23/22 0913   clonazePAM (KLONOPIN) tablet 1 mg  1 mg Oral TID PRN He, [name],  MD   1 mg at 11/21/22 2132   cloZAPine (CLOZARIL) tablet 300 mg  300 mg Oral QHS Alexyia Guarino T, MD   300 mg at 11/22/22 2148   diphenhydrAMINE (BENADRYL) capsule 50 mg  50 mg Oral Q6H PRN Leara Rawl T, MD   50 mg at 11/21/22 2132   Or   diphenhydrAMINE (BENADRYL) injection 50 mg  50 mg Intramuscular Q6H PRN Jesyca Weisenburger T, MD       feeding supplement (ENSURE ENLIVE / ENSURE PLUS) liquid 237 mL  237 mL Oral BID BM Marvell Tamer T, MD   237 mL at 11/23/22 8099   gabapentin (NEURONTIN) capsule 300 mg  300 mg Oral TID Jayson Waterhouse T, MD   300 mg at 11/23/22 0913   haloperidol (HALDOL) tablet 2 mg  2 mg Oral TID Dejanira Pamintuan T, MD   2 mg at 11/23/22 0913   haloperidol (HALDOL) tablet 5 mg  5 mg Oral Q6H PRN Mallisa Alameda T, MD   5 mg at 11/21/22 2132   Or   haloperidol  lactate (HALDOL) injection 5 mg  5 mg Intramuscular Q6H PRN Jayvyn Haselton T, MD       lisinopril (ZESTRIL) tablet 5 mg  5 mg Oral Daily Jaidah Lomax T, MD   5 mg at 11/23/22 8338   magnesium hydroxide (MILK OF MAGNESIA) suspension 30 mL  30 mL Oral Daily PRN Phillips Goulette T, MD   30 mL at 11/03/22 0803   metoprolol succinate (TOPROL-XL) 24 hr tablet 25 mg  25 mg Oral Daily Tangia Pinard T, MD   25 mg at 11/23/22 0914   ondansetron (ZOFRAN) tablet 4 mg  4 mg Oral Q8H PRN Lerry Liner M, NP   4 mg at 09/06/22 2320   paliperidone (INVEGA SUSTENNA) injection 156 mg  156 mg Intramuscular Q28 days Angelette Ganus T, MD   156 mg at 11/20/22 1741   paliperidone (INVEGA) 24 hr tablet 6 mg  6 mg Oral QHS Madalynn Pickelsimer T, MD   6 mg at 11/22/22 2149   senna-docusate (Senokot-S) tablet 2 tablet  2 tablet Oral QHS PRN Sarina Ill, DO   2 tablet at 09/15/22 2110   temazepam (RESTORIL) capsule 15 mg  15 mg Oral QHS Ronzell Laban T, MD   15 mg at 11/22/22 2149   ziprasidone (GEODON) injection 20 mg  20 mg Intramuscular Q12H PRN Helmi Hechavarria, [name], MD   20 mg at 07/08/22 1453    Lab Results: No results found for this or any previous visit (from the past 48 hour(s)).  Blood Alcohol level:  Lab Results  Component Value Date   ETH <10 07/20/2021    Metabolic Disorder Labs: Lab Results  Component Value Date   HGBA1C 5.5 04/10/2022   MPG 111.15 04/10/2022   No results found for: "PROLACTIN" Lab Results  Component Value Date   CHOL 167 11/20/2021   TRIG 107 11/20/2021   HDL 26 (L) 11/20/2021   CHOLHDL 6.4 11/20/2021   VLDL 21 11/20/2021   LDLCALC 120 (H) 11/20/2021    Physical Findings: AIMS: Facial and Oral Movements Muscles of Facial Expression: None, normal Lips and Perioral Area: None, normal Jaw: None, normal Tongue: None, normal,Extremity Movements Upper (arms, wrists, hands, fingers): None, normal Lower (legs, knees, ankles, toes): None, normal, Trunk Movements Neck, shoulders,  hips: None, normal, Overall Severity Severity of abnormal movements (highest score from questions above): None, normal Incapacitation due to abnormal movements: None, normal Patient's awareness  of abnormal movements (rate only patient's report): No Awareness, Dental Status Current problems with teeth and/or dentures?: No Does patient usually wear dentures?: No  CIWA:    COWS:     Musculoskeletal: Strength & Muscle Tone: within normal limits Gait & Station: normal Patient leans: N/A  Psychiatric Specialty Exam:  Presentation  General Appearance:  Appropriate for Environment; Casual; Neat; Well Groomed  Eye Contact: Fair  Speech: Slow  Speech Volume: Decreased  Handedness: Left   Mood and Affect  Mood: Anxious  Affect: Congruent   Thought Process  Thought Processes: Goal Directed  Descriptions of Associations:Circumstantial  Orientation:Partial  Thought Content:Scattered  History of Schizophrenia/Schizoaffective disorder:Yes  Duration of Psychotic Symptoms:Greater than six months  Hallucinations:No data recorded Ideas of Reference:None  Suicidal Thoughts:No data recorded Homicidal Thoughts:No data recorded  Sensorium  Memory: Remote Good  Judgment: Fair  Insight: Fair   Art therapist  Concentration: Fair  Attention Span: Fair  Recall: [employer] of Knowledge: Fair  Language: Fair   Psychomotor Activity  Psychomotor Activity:No data recorded  Assets  Assets:No data recorded  Sleep  Sleep:No data recorded   Physical Exam: Physical Exam Vitals and nursing note reviewed.  Constitutional:      Appearance: Normal appearance.  HENT:     Head: Normocephalic and atraumatic.     Mouth/Throat:     Pharynx: Oropharynx is clear.  Eyes:     Pupils: Pupils are equal, round, and reactive to light.  Cardiovascular:     Rate and Rhythm: Normal rate and regular rhythm.  Pulmonary:     Effort: Pulmonary effort is normal.      Breath sounds: Normal breath sounds.  Abdominal:     General: Abdomen is flat.     Palpations: Abdomen is soft.  Musculoskeletal:        General: Normal range of motion.  Skin:    General: Skin is warm and dry.  Neurological:     General: No focal deficit present.     Mental Status: He is alert. Mental status is at baseline.  Psychiatric:        Attention and Perception: Attention normal.        Mood and Affect: Mood normal. Affect is blunt.        Speech: He is noncommunicative.    Review of Systems  Unable to perform ROS: Language   Blood pressure 136/89, pulse 96, temperature 98 F (36.7 C), temperature source Oral, resp. rate 20, height 5\' 8"  (1.727 m), weight 85.8 kg, SpO2 100 %. Body mass index is 28.76 kg/m.   Treatment Plan Summary: Plan remains completely stable.  No change to overall treatment plan.  Effort continues to be focused on discharge  , MD 11/23/2022, 3:20 PM

## 2022-11-23 NOTE — Plan of Care (Signed)
Patient with his baseline behavior. Receptive with redirections most of the time. Compliant with medications. Appetite and energy level good. Support and encouragement given.

## 2022-11-23 NOTE — Progress Notes (Signed)
Recreation Therapy Notes    Date: 11/23/2022  Time: 10:05 am  Location: Craft room     Behavioral response: N/A   Intervention Topic: Self-care   Discussion/Intervention: Patient refused to attend group.   Clinical Observations/Feedback:  Patient refused to attend group.    Nels Munn LRT/CTRS        Cowen Pesqueira 11/23/2022 12:26 PM 

## 2022-11-24 NOTE — Progress Notes (Addendum)
[facility] MD Progress Note  11/24/2022 10:39 AM Adam [name]  MRN:  [id number] Subjective:  It is reported by staff and nursing, that patient was restless last evening but then settled down and was able to go back to sleep.  He was not aggressive.  He is compliant with his medications.  Today, he was seen watching football pleasantly but would not answer any questions.  He will later comes up to me and gestures that he would like to talk.  When asked about his mood, he replies no.  He repeats "future" several times.  Points to his hockey T-shirt and then gives a thumbs up.  Denies inward anger, depression irritability or anxiety today.  Denies symptoms of psychosis.  No new medical problems.  Eating well.   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[place])   Stroke (HCC)   HTN (hypertension)   HLD (hyperlipidemia)   CAD (coronary artery disease)   Chronic systolic CHF (congestive heart failure) (HCC)   Anxiety   Infection of left earlobe  Total Time spent with patient: 23 minutes  Past Psychiatric History: Past history of schizophrenia  Past Medical History:  Past Medical History:  Diagnosis Date   Myocardial infarction ([place])    Schizophrenia ([place])    Stroke ([place])     Past Surgical History:  Procedure Laterality Date   NO PAST SURGERIES     Family History:  Family History  Problem Relation Age of Onset   Hypertension Mother    Family Psychiatric  History: See previous Social History:  Social History   Substance and Sexual Activity  Alcohol Use Not Currently     Social History   Substance and Sexual Activity  Drug Use Not Currently    Social History   Socioeconomic History   Marital status: Single    Spouse name: Not on file   Number of children: Not on  file   Years of education: Not on file   Highest education level: Not on file  Occupational History   Not on file  Tobacco Use   Smoking status: Never    Passive exposure: Never   Smokeless tobacco: Never  Vaping Use   Vaping Use: Unknown  Substance and Sexual Activity   Alcohol use: Not Currently   Drug use: Not Currently   Sexual activity: Not Currently  Other Topics Concern   Not on file  Social History Narrative   Not on file   Social Determinants of Health   Financial Resource Strain: Not on file  Food Insecurity: Not on file  Transportation Needs: Not on file  Physical Activity: Not on file  Stress: Not on file  Social Connections: Not on file   Additional Social History:  Specify valuables returned:  (none)                      Sleep: Fair  Appetite:  Fair  Current Medications: Current Facility-Administered Medications  Medication Dose Route Frequency Provider Last Rate Last Admin   acetaminophen (TYLENOL) tablet 650 mg  650 mg Oral Q6H PRN [name], [name], MD   650 mg at 11/15/22 1239   alum & mag hydroxide-simeth (MAALOX/MYLANTA) 200-200-20 MG/5ML suspension 30 mL  30 mL Oral Q4H PRN [name], [name], MD   30 mL at 04/26/22 0721   aspirin EC tablet  81 mg  81 mg Oral Daily [name], [name], MD   81 mg at 11/24/22 0930   atorvastatin (LIPITOR) tablet 20 mg  20 mg Oral Daily [name], [name], MD   20 mg at 11/24/22 0930   atropine 1 % ophthalmic solution 1 drop  1 drop Sublingual QID [name], MD   1 drop at 11/24/22 0347   benztropine (COGENTIN) tablet 0.5 mg  0.5 mg Oral BID [name], [name], MD   0.5 mg at 11/24/22 0931   clonazePAM (KLONOPIN) tablet 1 mg  1 mg Oral TID PRN He, [name], MD   1 mg at 11/21/22 2132   cloZAPine (CLOZARIL) tablet 300 mg  300 mg Oral QHS [name], [name], MD   300 mg at 11/23/22 2112   diphenhydrAMINE (BENADRYL) capsule 50 mg  50 mg Oral Q6H PRN [name], [name], MD   50 mg at 11/21/22 2132   Or   diphenhydrAMINE (BENADRYL)  injection 50 mg  50 mg Intramuscular Q6H PRN [name], [name], MD       feeding supplement (ENSURE ENLIVE / ENSURE PLUS) liquid 237 mL  237 mL Oral BID BM [name], [name], MD   237 mL at 11/24/22 0944   gabapentin (NEURONTIN) capsule 300 mg  300 mg Oral TID [name], [name], MD   300 mg at 11/24/22 4259   haloperidol (HALDOL) tablet 2 mg  2 mg Oral TID [name], [name], MD   2 mg at 11/24/22 0930   haloperidol (HALDOL) tablet 5 mg  5 mg Oral Q6H PRN [name], [name], MD   5 mg at 11/21/22 2132   Or   haloperidol lactate (HALDOL) injection 5 mg  5 mg Intramuscular Q6H PRN [name], [name], MD       lisinopril (ZESTRIL) tablet 5 mg  5 mg Oral Daily [name], [name], MD   5 mg at 11/24/22 0935   magnesium hydroxide (MILK OF MAGNESIA) suspension 30 mL  30 mL Oral Daily PRN [name], [name], MD   30 mL at 11/03/22 0803   metoprolol succinate (TOPROL-XL) 24 hr tablet 25 mg  25 mg Oral Daily [name], [name], MD   25 mg at 11/24/22 0935   ondansetron (ZOFRAN) tablet 4 mg  4 mg Oral Q8H PRN Lerry Liner M, NP   4 mg at 09/06/22 2320   paliperidone (INVEGA SUSTENNA) injection 156 mg  156 mg Intramuscular Q28 days [name], [name], MD   156 mg at 11/20/22 1741   paliperidone (INVEGA) 24 hr tablet 6 mg  6 mg Oral QHS [name], [name], MD   6 mg at 11/23/22 2112   senna-docusate (Senokot-S) tablet 2 tablet  2 tablet Oral QHS PRN Sarina Ill, DO   2 tablet at 09/15/22 2110   temazepam (RESTORIL) capsule 15 mg  15 mg Oral QHS [name], [name], MD   15 mg at 11/23/22 2112   ziprasidone (GEODON) injection 20 mg  20 mg Intramuscular Q12H PRN [name], [name], MD   20 mg at 07/08/22 1453    Lab Results: No results found for this or any previous visit (from the past 48 hour(s)).  Blood Alcohol level:  Lab Results  Component Value Date   ETH <10 07/20/2021    Metabolic Disorder Labs: Lab Results  Component Value Date   HGBA1C 5.5 04/10/2022   MPG 111.15 04/10/2022   No results found for:  "PROLACTIN" Lab Results  Component Value Date  CHOL 167 11/20/2021   TRIG 107 11/20/2021   HDL 26 (L) 11/20/2021   CHOLHDL 6.4 11/20/2021   VLDL 21 11/20/2021   LDLCALC 120 (H) 11/20/2021    Physical Findings: AIMS: Facial and Oral Movements Muscles of Facial Expression: None, normal Lips and Perioral Area: None, normal Jaw: None, normal Tongue: None, normal,Extremity Movements Upper (arms, wrists, hands, fingers): None, normal Lower (legs, knees, ankles, toes): None, normal, Trunk Movements Neck, shoulders, hips: None, normal, Overall Severity Severity of abnormal movements (highest score from questions above): None, normal Incapacitation due to abnormal movements: None, normal Patient's awareness of abnormal movements (rate only patient's report): No Awareness, Dental Status Current problems with teeth and/or dentures?: No Does patient usually wear dentures?: No  CIWA:    COWS:     Musculoskeletal: Strength & Muscle Tone: within normal limits Gait & Station: normal Patient leans: N/A  Psychiatric Specialty Exam:  Presentation  General Appearance:  Appropriate for Environment; Casual; Neat; Well Groomed  Eye Contact: Fair  Speech: Slow  Speech Volume: Decreased  Handedness: Left   Mood and Affect  Mood: okay Affect: Congruent   Thought Process  Thought Processes: Goal Directed  Descriptions of Associations:Circumstantial  Orientation:Partial  Thought Content:Scattered  History of Schizophrenia/Schizoaffective disorder:Yes   Sensorium  Memory: Remote Good  Judgment: Fair  Insight: Fair   Materials engineer: Fair  Attention Span: Fair  Recall: [place] of Knowledge: Fair  Language: Fair    Physical Exam: Physical Exam Vitals and nursing note reviewed.  Constitutional:      Appearance: Normal appearance.  HENT:     Head: Normocephalic and atraumatic.     Mouth/Throat:     Pharynx: Oropharynx  is clear.  Eyes:     Pupils: Pupils are equal, round, and reactive to light.  Cardiovascular:     Rate and Rhythm: Normal rate and regular rhythm.  Pulmonary:     Effort: Pulmonary effort is normal.     Breath sounds: Normal breath sounds.  Abdominal:     General: Abdomen is flat.     Palpations: Abdomen is soft.  Musculoskeletal:        General: Normal range of motion.  Skin:    General: Skin is warm and dry.  Neurological:     General: No focal deficit present.     Mental Status: He is alert. Mental status is at baseline.  Psychiatric:        Attention and Perception: Attention normal.        Mood and Affect: Mood normal. Affect is blunt.        Speech: He is noncommunicative.    Review of Systems  Unable to perform ROS: Language   Blood pressure 104/74, pulse (!) 108, temperature 98 F (36.7 C), temperature source Oral, resp. rate 20, height 5\' 8"  (1.727 m), weight 85.8 kg, SpO2 100 %. Body mass index is 28.76 kg/m.   Treatment Plan Summary: Plan remains completely stable.  No change to overall treatment plan.  Effort continues to be focused on discharge  12/23 No changes  Rulon Sera, MD 11/24/2022, 10:39 AM

## 2022-11-24 NOTE — Plan of Care (Signed)
Patient is generally calm and cooperative, mild but infrequent bursts of frustration; denies SI HI AVH, compliant with medications.  Continued monitoring via q 15 minute checks.  Problem: Education: Goal: Ability to state activities that reduce stress will improve Outcome: Progressing   Problem: Coping: Goal: Ability to identify and develop effective coping behavior will improve Outcome: Progressing   Problem: Self-Concept: Goal: Ability to identify factors that promote anxiety will improve Outcome: Progressing Goal: Level of anxiety will decrease Outcome: Progressing Goal: Ability to modify response to factors that promote anxiety will improve Outcome: Progressing   Problem: Self-Concept: Goal: Ability to identify factors that promote anxiety will improve Outcome: Progressing Goal: Level of anxiety will decrease Outcome: Progressing Goal: Ability to modify response to factors that promote anxiety will improve Outcome: Progressing   Problem: Activity: Goal: Risk for activity intolerance will decrease Outcome: Progressing   Problem: Activity: Goal: Interest or engagement in leisure activities will improve Outcome: Progressing   Problem: Skin Integrity: Goal: Risk for impaired skin integrity will decrease Outcome: Progressing   Problem: Safety: Goal: Ability to remain free from injury will improve Outcome: Progressing

## 2022-11-24 NOTE — Progress Notes (Signed)
Patient is alert and oriented x 2 with periods of confusion to situation and time, he was frequently redirected. Patient's affect is flat but brightens upon approach, he appears irritable and restless on this shift. Patient is not interacting appropriately with peers and staff. Patient denies SI/HI/AVH, he was offered emotional support. Patient was complaint with medication regimen, will continue to monitor.

## 2022-11-24 NOTE — Progress Notes (Signed)
Patient refused evening vitals.

## 2022-11-25 LAB — CBC WITH DIFFERENTIAL/PLATELET
Abs Immature Granulocytes: 0.02 10*3/uL (ref 0.00–0.07)
Basophils Absolute: 0.1 10*3/uL (ref 0.0–0.1)
Basophils Relative: 2 %
Eosinophils Absolute: 0 10*3/uL (ref 0.0–0.5)
Eosinophils Relative: 0 %
HCT: 37.8 % — ABNORMAL LOW (ref 39.0–52.0)
Hemoglobin: 13 g/dL (ref 13.0–17.0)
Immature Granulocytes: 0 %
Lymphocytes Relative: 18 %
Lymphs Abs: 1 10*3/uL (ref 0.7–4.0)
MCH: 31.2 pg (ref 26.0–34.0)
MCHC: 34.4 g/dL (ref 30.0–36.0)
MCV: 90.6 fL (ref 80.0–100.0)
Monocytes Absolute: 0.5 10*3/uL (ref 0.1–1.0)
Monocytes Relative: 10 %
Neutro Abs: 3.7 10*3/uL (ref 1.7–7.7)
Neutrophils Relative %: 70 %
Platelets: 198 10*3/uL (ref 150–400)
RBC: 4.17 MIL/uL — ABNORMAL LOW (ref 4.22–5.81)
RDW: 12.5 % (ref 11.5–15.5)
WBC: 5.4 10*3/uL (ref 4.0–10.5)
nRBC: 0 % (ref 0.0–0.2)

## 2022-11-25 NOTE — Progress Notes (Signed)
Patient had been in a pleasant mood for most the shift. Then out of nowhere he became agitated. Started raising his voice and yelling out "oh my god" 'oh my god" over and over as he paced up and down the hall. Had to be redirected to calm down and to lower his voice. Was initially [facility], but did eventually come and take his meds without issue.  Was having a hard time either saying or remembering his mother Adam [name]'s name. When it finally came to him he was in a better mood and just kept repeating her name over and over again with a smile. He denies having any psychotic symptoms.  Denies depression, anxiety and pain. Q15 minute safety checks in place for monitoring.   C Butler-Nicholson, LPN

## 2022-11-25 NOTE — Plan of Care (Signed)
  Problem: Self-Concept: Goal: Level of anxiety will decrease Outcome: Progressing   Problem: Self-Concept: Goal: Ability to modify response to factors that promote anxiety will improve Outcome: Progressing   Problem: Nutrition: Goal: Adequate nutrition will be maintained Outcome: Progressing   Problem: Coping: Goal: Coping ability will improve Outcome: Progressing

## 2022-11-25 NOTE — Plan of Care (Signed)
Patient was generally calm and cooperative today.  Pt had no psych/physical complaints and was compliant with medications.  He continues to be frustrated at times due to his aphasia.  Continued monitoring for safety via q 15 minute checks.

## 2022-11-25 NOTE — Progress Notes (Signed)
Patient is alert and oriented x 4, affect is flat but brightens upon approach, he appears ess irritable and interacting appropriately with peers and staff. Patient denies SI/HI/AVH, he was offered emotional support. Patient was complaint with medication regimen no distress noted will continue to monitor.    

## 2022-11-25 NOTE — Progress Notes (Signed)
[facility] MD Progress Note  11/25/2022 9:38 AM Adam [name]  MRN:  [id number] Subjective:  12/24 Per report, patient woke up around 2:00 this morning and then proceeded to yell, but was re-directable. He laughs at this this morning.  He is bright and alert.  He is not aggressive.  Cannot tell me why he was yelling last night.  Taking his medications.  Denies psychosis.  No new medical problems.  Points to his hockey T-shirt again again tells me that he likes the [place].  Suicidal or homicidal thoughts.  12/23 It is reported by staff and nursing, that patient was restless last evening but then settled down and was able to go back to sleep.  He was not aggressive.  He is compliant with his medications.  Today, he was seen watching football pleasantly but would not answer any questions.  He will later comes up to me and gestures that he would like to talk.  When asked about his mood, he replies no.  He repeats "future" several times.  Points to his hockey T-shirt and then gives a thumbs up.  Denies inward anger, depression irritability or anxiety today.  Denies symptoms of psychosis.  No new medical problems.  Eating well.   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)   Stroke (HCC)   HTN (hypertension)   HLD (hyperlipidemia)   CAD (coronary artery disease)   Chronic systolic CHF (congestive heart failure) (HCC)   Anxiety   Infection of left earlobe  Total Time spent with patient: 21 minutes  Past Psychiatric History: Past history of schizophrenia  Past Medical History:  Past Medical History:  Diagnosis Date   Myocardial infarction (HCC)    Schizophrenia (HCC)    Stroke (HCC)     Past Surgical History:  Procedure Laterality Date   NO PAST SURGERIES     Family History:   Family History  Problem Relation Age of Onset   Hypertension Mother    Family Psychiatric  History: See previous Social History:  Social History   Substance and Sexual Activity  Alcohol Use Not Currently     Social History   Substance and Sexual Activity  Drug Use Not Currently    Social History   Socioeconomic History   Marital status: Single    Spouse name: Not on file   Number of children: Not on file   Years of education: Not on file   Highest education level: Not on file  Occupational History   Not on file  Tobacco Use   Smoking status: Never    Passive exposure: Never   Smokeless tobacco: Never  Vaping Use   Vaping Use: Unknown  Substance and Sexual Activity   Alcohol use: Not Currently   Drug use: Not Currently   Sexual activity: Not Currently  Other Topics Concern   Not on file  Social History Narrative   Not on file   Social Determinants of Health   Financial Resource Strain: Not on file  Food Insecurity: Not on file  Transportation Needs: Not on file  Physical Activity: Not on file  Stress: Not on file  Social Connections: Not on file   Additional Social History:  Specify valuables returned:  (none)                      Sleep:  Fair  Appetite:  Fair  Current Medications: Current Facility-Administered Medications  Medication Dose Route Frequency Provider Last Rate Last Admin   acetaminophen (TYLENOL) tablet 650 mg  650 mg Oral Q6H PRN [name], [name], MD   650 mg at 11/15/22 1239   alum & mag hydroxide-simeth (MAALOX/MYLANTA) 200-200-20 MG/5ML suspension 30 mL  30 mL Oral Q4H PRN [name], [name], MD   30 mL at 04/26/22 2233   aspirin EC tablet 81 mg  81 mg Oral Daily [name], [name], MD   81 mg at 11/25/22 6122   atorvastatin (LIPITOR) tablet 20 mg  20 mg Oral Daily [name], [name], MD   20 mg at 11/25/22 0825   atropine 1 % ophthalmic solution 1 drop  1 drop Sublingual QID [name], MD   1 drop at 11/25/22 4497   benztropine  (COGENTIN) tablet 0.5 mg  0.5 mg Oral BID [name], [name], MD   0.5 mg at 11/25/22 5300   clonazePAM (KLONOPIN) tablet 1 mg  1 mg Oral TID PRN He, [name], MD   1 mg at 11/25/22 0602   cloZAPine (CLOZARIL) tablet 300 mg  300 mg Oral QHS [name], [name], MD   300 mg at 11/24/22 2135   diphenhydrAMINE (BENADRYL) capsule 50 mg  50 mg Oral Q6H PRN [name], [name], MD   50 mg at 11/21/22 2132   Or   diphenhydrAMINE (BENADRYL) injection 50 mg  50 mg Intramuscular Q6H PRN [name], [name], MD       feeding supplement (ENSURE ENLIVE / ENSURE PLUS) liquid 237 mL  237 mL Oral BID BM [name], [name], MD   237 mL at 11/24/22 1210   gabapentin (NEURONTIN) capsule 300 mg  300 mg Oral TID [name], [name], MD   300 mg at 11/24/22 2100   haloperidol (HALDOL) tablet 2 mg  2 mg Oral TID [name], [name], MD   2 mg at 11/24/22 2100   haloperidol (HALDOL) tablet 5 mg  5 mg Oral Q6H PRN [name], [name], MD   5 mg at 11/21/22 2132   Or   haloperidol lactate (HALDOL) injection 5 mg  5 mg Intramuscular Q6H PRN [name], [name], MD       lisinopril (ZESTRIL) tablet 5 mg  5 mg Oral Daily [name], [name], MD   5 mg at 11/25/22 5110   magnesium hydroxide (MILK OF MAGNESIA) suspension 30 mL  30 mL Oral Daily PRN [name], [name], MD   30 mL at 11/03/22 0803   metoprolol succinate (TOPROL-XL) 24 hr tablet 25 mg  25 mg Oral Daily [name], [name], MD   25 mg at 11/25/22 0825   ondansetron (ZOFRAN) tablet 4 mg  4 mg Oral Q8H PRN Lerry Liner M, NP   4 mg at 09/06/22 2320   paliperidone (INVEGA SUSTENNA) injection 156 mg  156 mg Intramuscular Q28 days [name], [name], MD   156 mg at 11/20/22 1741   paliperidone (INVEGA) 24 hr tablet 6 mg  6 mg Oral QHS [name], [name], MD   6 mg at 11/24/22 2134   senna-docusate (Senokot-S) tablet 2 tablet  2 tablet Oral QHS PRN Sarina Ill, DO   2 tablet at 09/15/22 2110   temazepam (RESTORIL) capsule 15 mg  15 mg Oral QHS [name], [name], MD   15 mg at 11/24/22 2134   ziprasidone (GEODON)  injection 20 mg  20 mg Intramuscular Q12H PRN [name], [name],  MD   20 mg at 07/08/22 1453    Lab Results: No results found for this or any previous visit (from the past 48 hour(s)).  Blood Alcohol level:  Lab Results  Component Value Date   ETH <10 07/20/2021    Metabolic Disorder Labs: Lab Results  Component Value Date   HGBA1C 5.5 04/10/2022   MPG 111.15 04/10/2022   No results found for: "PROLACTIN" Lab Results  Component Value Date   CHOL 167 11/20/2021   TRIG 107 11/20/2021   HDL 26 (L) 11/20/2021   CHOLHDL 6.4 11/20/2021   VLDL 21 11/20/2021   LDLCALC 120 (H) 11/20/2021    Physical Findings: AIMS: Facial and Oral Movements Muscles of Facial Expression: None, normal Lips and Perioral Area: None, normal Jaw: None, normal Tongue: None, normal,Extremity Movements Upper (arms, wrists, hands, fingers): None, normal Lower (legs, knees, ankles, toes): None, normal, Trunk Movements Neck, shoulders, hips: None, normal, Overall Severity Severity of abnormal movements (highest score from questions above): None, normal Incapacitation due to abnormal movements: None, normal Patient's awareness of abnormal movements (rate only patient's report): No Awareness, Dental Status Current problems with teeth and/or dentures?: No Does patient usually wear dentures?: No  CIWA:    COWS:     Musculoskeletal: Strength & Muscle Tone: within normal limits Gait & Station: normal Patient leans: N/A  Psychiatric Specialty Exam:  Presentation  General Appearance:  Appropriate for Environment; Casual; Neat; Well Groomed  Eye Contact: Fair  Speech: Slow  Speech Volume: Decreased  Handedness: Left   Mood and Affect  Mood: okay Affect: Congruent   Thought Process  Thought Processes: Goal Directed  Descriptions of Associations:Circumstantial  Orientation:Partial  Thought Content:Scattered  History of Schizophrenia/Schizoaffective disorder:Yes   Sensorium   Memory: Remote Good  Judgment: Fair  Insight: Fair   Chartered certified accountant: Fair  Attention Span: Fair  Recall: Fair  Fund of Knowledge: Fair  Language: Fair    Physical Exam: Physical Exam Vitals and nursing note reviewed.  Constitutional:      Appearance: Normal appearance.  HENT:     Head: Normocephalic and atraumatic.     Mouth/Throat:     Pharynx: Oropharynx is clear.  Eyes:     Pupils: Pupils are equal, round, and reactive to light.  Cardiovascular:     Rate and Rhythm: Normal rate and regular rhythm.  Pulmonary:     Effort: Pulmonary effort is normal.     Breath sounds: Normal breath sounds.  Abdominal:     General: Abdomen is flat.     Palpations: Abdomen is soft.  Musculoskeletal:        General: Normal range of motion.  Skin:    General: Skin is warm and dry.  Neurological:     General: No focal deficit present.     Mental Status: He is alert. Mental status is at baseline.  Psychiatric:        Attention and Perception: Attention normal.        Mood and Affect: Mood normal. Affect is blunt.        Speech: He is noncommunicative.    Review of Systems  Unable to perform ROS: Language   Blood pressure 103/72, pulse 85, temperature 98 F (36.7 C), resp. rate 20, height 5\' 8"  (1.727 m), weight 85.8 kg, SpO2 100 %. Body mass index is 28.76 kg/m.   Treatment Plan Summary: Plan remains completely stable.  No change to overall treatment plan.  Effort continues to be focused on  discharge  12/23 No changes  12/24 No changes  Reggie Pile, MD 11/25/2022, 9:38 AM

## 2022-11-26 NOTE — Progress Notes (Signed)
D-Patient is A & O x 3-4. Affect bright,mood labile. No SI/HI/AVH. Patient denies pain at present. Erratic, non harmful behavior earlier in the shift hollering loudly in milieu but verbally deescalated by staff. A- Scheduled medications administered to patient, per MD orders. Support and encouragement provided.  Routine safety checks conducted every 15 minutes without incident.  Patient informed to notify staff with problems or concerns. R- No adverse drug reactions noted. Patient compliant with medications and treatment plan. Patient receptive and cooperative the majority of this shift. Patient interacts well with others on the unit but verbally disruptive at times.  Patient contracts for safety and remains safe at this time on the unit.

## 2022-11-26 NOTE — Progress Notes (Signed)
Pharmacy - Clozapine     This patient's order has been reviewed for prescribing contraindications.   Clozapine REMS enrollment Verified: yes REMS number: [id number] Enrollment date: 01/04/2022 Current Outpatient Monitoring: every week  Dose Adjustments This Admission: Clozapine 300mg  qhs since 03/19/2022  Plan: Lab submitted to clozapine REMS 11/25/22 Continue ANC labs every week while inpatient   11/27/22, PharmD Clinical Pharmacist 11/26/2022 12:14 PM

## 2022-11-26 NOTE — Progress Notes (Signed)
[facility] MD Progress Note  11/26/2022 8:47 AM Adam [name]  MRN:  [id number] Subjective:  12/25 Patient was seen resting comfortably in his room today.  When asked about how he is feeling, he remarks "yeah" while giving me a thumbs up.  Shows me Christmas wrapping paper in the corner of his room and then points to his sweatshirt, indicating that this was a gift to him.  He is pleasant and cooperative, no episodes of aggression in the past 24 hours.  Denies depression.  Slept well.  He was seen using the phone this morning.  12/24 Per report, patient woke up around 2:00 this morning and then proceeded to yell, but was re-directable. He laughs at this this morning.  He is bright and alert.  He is not aggressive.  Cannot tell me why he was yelling last night.  Taking his medications.  Denies psychosis.  No new medical problems.  Points to his hockey T-shirt again again tells me that he likes the [employer].  Suicidal or homicidal thoughts.  12/23 It is reported by staff and nursing, that patient was restless last evening but then settled down and was able to go back to sleep.  He was not aggressive.  He is compliant with his medications.  Today, he was seen watching football pleasantly but would not answer any questions.  He will later comes up to me and gestures that he would like to talk.  When asked about his mood, he replies no.  He repeats "future" several times.  Points to his hockey T-shirt and then gives a thumbs up.  Denies inward anger, depression irritability or anxiety today.  Denies symptoms of psychosis.  No new medical problems.  Eating well.   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)   Stroke (HCC)   HTN (hypertension)   HLD (hyperlipidemia)   CAD (coronary artery  disease)   Chronic systolic CHF (congestive heart failure) (HCC)   Anxiety   Infection of left earlobe  Total Time spent with patient: 22 minutes  Past Psychiatric History: Past history of schizophrenia  Past Medical History:  Past Medical History:  Diagnosis Date   Myocardial infarction (HCC)    Schizophrenia (HCC)    Stroke (HCC)     Past Surgical History:  Procedure Laterality Date   NO PAST SURGERIES     Family History:  Family History  Problem Relation Age of Onset   Hypertension Mother    Family Psychiatric  History: See previous Social History:  Social History   Substance and Sexual Activity  Alcohol Use Not Currently     Social History   Substance and Sexual Activity  Drug Use Not Currently    Social History   Socioeconomic History   Marital status: Single    Spouse name: Not on file   Number of children: Not on file   Years of education: Not on file   Highest education level: Not on file  Occupational History   Not on file  Tobacco Use   Smoking status: Never    Passive exposure: Never   Smokeless tobacco: Never  Vaping Use   Vaping Use: Unknown  Substance and Sexual Activity   Alcohol use: Not Currently   Drug use: Not Currently   Sexual activity: Not Currently  Other Topics Concern   Not on file  Social History Narrative   Not on file   Social Determinants of Health   Financial Resource Strain: Not on file  Food Insecurity: Not on file  Transportation Needs: Not on file  Physical Activity: Not on file  Stress: Not on file  Social Connections: Not on file   Additional Social History:  Specify valuables returned:  (none)                      Sleep: Fair  Appetite:  Fair  Current Medications: Current Facility-Administered Medications  Medication Dose Route Frequency Provider Last Rate Last Admin   acetaminophen (TYLENOL) tablet 650 mg  650 mg Oral Q6H PRN [name], [name], MD   650 mg at 11/15/22 1239   alum & mag  hydroxide-simeth (MAALOX/MYLANTA) 200-200-20 MG/5ML suspension 30 mL  30 mL Oral Q4H PRN [name], [name], MD   30 mL at 04/26/22 4174   aspirin EC tablet 81 mg  81 mg Oral Daily [name], [name], MD   81 mg at 11/25/22 0814   atorvastatin (LIPITOR) tablet 20 mg  20 mg Oral Daily [name], [name], MD   20 mg at 11/25/22 0825   atropine 1 % ophthalmic solution 1 drop  1 drop Sublingual QID [name], MD   1 drop at 11/25/22 2126   benztropine (COGENTIN) tablet 0.5 mg  0.5 mg Oral BID [name], [name], MD   0.5 mg at 11/25/22 1648   clonazePAM (KLONOPIN) tablet 1 mg  1 mg Oral TID PRN He, [name], MD   1 mg at 11/25/22 0602   cloZAPine (CLOZARIL) tablet 300 mg  300 mg Oral QHS [name], [name], MD   300 mg at 11/25/22 2126   diphenhydrAMINE (BENADRYL) capsule 50 mg  50 mg Oral Q6H PRN [name], [name], MD   50 mg at 11/21/22 2132   Or   diphenhydrAMINE (BENADRYL) injection 50 mg  50 mg Intramuscular Q6H PRN [name], [name], MD       feeding supplement (ENSURE ENLIVE / ENSURE PLUS) liquid 237 mL  237 mL Oral BID BM [name], [name], MD   237 mL at 11/24/22 1210   gabapentin (NEURONTIN) capsule 300 mg  300 mg Oral TID [name], [name], MD   300 mg at 11/25/22 2126   haloperidol (HALDOL) tablet 2 mg  2 mg Oral TID [name], [name], MD   2 mg at 11/25/22 2123   haloperidol (HALDOL) tablet 5 mg  5 mg Oral Q6H PRN [name], [name], MD   5 mg at 11/21/22 2132   Or   haloperidol lactate (HALDOL) injection 5 mg  5 mg Intramuscular Q6H PRN [name], [name], MD       lisinopril (ZESTRIL) tablet 5 mg  5 mg Oral Daily [name], [name], MD   5 mg at 11/25/22 0824   magnesium hydroxide (MILK OF MAGNESIA) suspension 30 mL  30 mL Oral Daily PRN [name], [name], MD   30 mL at 11/03/22 0803   metoprolol succinate (TOPROL-XL) 24 hr tablet 25 mg  25 mg Oral Daily [name], [name], MD   25 mg at 11/25/22 0825   ondansetron (ZOFRAN) tablet 4 mg  4 mg Oral Q8H PRN Jearld Lesch, NP   4 mg at 09/06/22 2320   paliperidone (INVEGA  SUSTENNA) injection 156 mg  156 mg Intramuscular Q28 days [name], [name], MD   156 mg at 11/20/22 1741   paliperidone (INVEGA)  24 hr tablet 6 mg  6 mg Oral QHS [name], [name], MD   6 mg at 11/25/22 2125   senna-docusate (Senokot-S) tablet 2 tablet  2 tablet Oral QHS PRN Sarina Ill, DO   2 tablet at 09/15/22 2110   temazepam (RESTORIL) capsule 15 mg  15 mg Oral QHS [name], [name], MD   15 mg at 11/25/22 2126   ziprasidone (GEODON) injection 20 mg  20 mg Intramuscular Q12H PRN [name], [name], MD   20 mg at 07/08/22 1453    Lab Results:  Results for orders placed or performed during the hospital encounter of 03/19/22 (from the past 48 hour(s))  CBC with Differential/Platelet     Status: Abnormal   Collection Time: 11/25/22 11:27 AM  Result Value Ref Range   WBC 5.4 4.0 - 10.5 K/uL   RBC 4.17 (L) 4.22 - 5.81 MIL/uL   Hemoglobin 13.0 13.0 - 17.0 g/dL   HCT 31.5 (L) 40.0 - 86.7 %   MCV 90.6 80.0 - 100.0 fL   MCH 31.2 26.0 - 34.0 pg   MCHC 34.4 30.0 - 36.0 g/dL   RDW 61.9 50.9 - 32.6 %   Platelets 198 150 - 400 K/uL   nRBC 0.0 0.0 - 0.2 %   Neutrophils Relative % 70 %   Neutro Abs 3.7 1.7 - 7.7 K/uL   Lymphocytes Relative 18 %   Lymphs Abs 1.0 0.7 - 4.0 K/uL   Monocytes Relative 10 %   Monocytes Absolute 0.5 0.1 - 1.0 K/uL   Eosinophils Relative 0 %   Eosinophils Absolute 0.0 0.0 - 0.5 K/uL   Basophils Relative 2 %   Basophils Absolute 0.1 0.0 - 0.1 K/uL   Immature Granulocytes 0 %   Abs Immature Granulocytes 0.02 0.00 - 0.07 K/uL    Comment: Performed at [facility], [address] [place], [place] [address]    Blood Alcohol level:  Lab Results  Component Value Date   [facility] <10 07/20/2021    Metabolic Disorder Labs: Lab Results  Component Value Date   HGBA1C 5.5 04/10/2022   MPG 111.15 04/10/2022   No results found for: "PROLACTIN" Lab Results  Component Value Date   CHOL 167 11/20/2021   TRIG 107 11/20/2021   HDL 26 (L) 11/20/2021   CHOLHDL  6.4 11/20/2021   VLDL 21 11/20/2021   LDLCALC 120 (H) 11/20/2021    Physical Findings: AIMS: Facial and Oral Movements Muscles of Facial Expression: None, normal Lips and Perioral Area: None, normal Jaw: None, normal Tongue: None, normal,Extremity Movements Upper (arms, wrists, hands, fingers): None, normal Lower (legs, knees, ankles, toes): None, normal, Trunk Movements Neck, shoulders, hips: None, normal, Overall Severity Severity of abnormal movements (highest score from questions above): None, normal Incapacitation due to abnormal movements: None, normal Patient's awareness of abnormal movements (rate only patient's report): No Awareness, Dental Status Current problems with teeth and/or dentures?: No Does patient usually wear dentures?: No  CIWA:    COWS:     Musculoskeletal: Strength & Muscle Tone: within normal limits Gait & Station: normal Patient leans: N/A  Psychiatric Specialty Exam:  Presentation  General Appearance:  Appropriate for Environment; Casual; Neat; Well Groomed  Eye Contact: Fair  Speech: Slow  Speech Volume: Decreased  Handedness: Left   Mood and Affect  Mood: "Yeah" Affect: bright   Thought Process  Thought Processes: Goal Directed  Descriptions of Associations:Circumstantial  Orientation:Partial  Thought Content:Scattered  History of Schizophrenia/Schizoaffective disorder:Yes  Sensorium  Memory: Remote Good  Judgment: Fair  Insight: Fair   Chartered certified accountant: Fair  Attention Span: poor  Recall: [employer] of Knowledge: Fair  Language: Fair    Physical Exam: Physical Exam Vitals and nursing note reviewed.  Constitutional:      Appearance: Normal appearance.  HENT:     Head: Normocephalic and atraumatic.     Mouth/Throat:     Pharynx: Oropharynx is clear.  Eyes:     Pupils: Pupils are equal, round, and reactive to light.  Cardiovascular:     Rate and Rhythm: Normal rate  and regular rhythm.  Pulmonary:     Effort: Pulmonary effort is normal.     Breath sounds: Normal breath sounds.  Abdominal:     General: Abdomen is flat.     Palpations: Abdomen is soft.  Musculoskeletal:        General: Normal range of motion.  Skin:    General: Skin is warm and dry.  Neurological:     General: No focal deficit present.     Mental Status: He is alert. Mental status is at baseline.  Psychiatric:        Attention and Perception: Attention normal.        Mood and Affect: Mood normal. Affect is blunt.        Speech: He is noncommunicative.    Review of Systems  Unable to perform ROS: Language   Blood pressure 103/72, pulse 85, temperature 98 F (36.7 C), resp. rate 20, height 5\' 8"  (1.727 m), weight 85.8 kg, SpO2 100 %. Body mass index is 28.76 kg/m.   Treatment Plan Summary: Plan remains completely stable.  No change to overall treatment plan.  Effort continues to be focused on discharge 12/25 Pt stable for discharge. Pending placement.  12/24 No changes  12/23 No changes    1/24, MD 11/26/2022, 8:47 AM

## 2022-11-26 NOTE — Plan of Care (Signed)
Patient alert and cooperative on assessment.  Patient is medication compliant.  No s/s of anxiety or depression noted.  Patient denies SI/HI and AVH.  Patient does not appear to be responding to stimuli.  Patient is out of room in dayroom , interacting with other patients and walking in the hallwa.   Patient denies pain.  Patient can contract for safety, q 15 minute rounds in progress, will continue to monitor. Problem: Self-Concept: Goal: Level of anxiety will decrease Outcome: Progressing   Problem: Activity: Goal: Risk for activity intolerance will decrease Outcome: Progressing   Problem: Nutrition: Goal: Adequate nutrition will be maintained Outcome: Progressing   Problem: Coping: Goal: Level of anxiety will decrease Outcome: Progressing   Problem: Pain Managment: Goal: General experience of comfort will improve Outcome: Progressing   Problem: Education: Goal: Will be free of psychotic symptoms Outcome: Progressing   Problem: Coping: Goal: Ability to identify and develop effective coping behavior will improve Outcome: Not Progressing   Problem: Self-Concept: Goal: Ability to identify factors that promote anxiety will improve Outcome: Not Progressing

## 2022-11-26 NOTE — Plan of Care (Signed)
  Problem: Education: Goal: Ability to state activities that reduce stress will improve Outcome: Not Progressing   Problem: Coping: Goal: Ability to identify and develop effective coping behavior will improve Outcome: Not Progressing   Problem: Self-Concept: Goal: Ability to identify factors that promote anxiety will improve Outcome: Not Progressing Goal: Level of anxiety will decrease Outcome: Not Progressing Goal: Ability to modify response to factors that promote anxiety will improve Outcome: Not Progressing   Problem: Education: Goal: Knowledge of General Education information will improve Description: Including pain rating scale, medication(s)/side effects and non-pharmacologic comfort measures Outcome: Not Progressing   Problem: Health Behavior/Discharge Planning: Goal: Ability to manage health-related needs will improve Outcome: Not Progressing   Problem: Clinical Measurements: Goal: Ability to maintain clinical measurements within normal limits will improve Outcome: Not Progressing Goal: Will remain free from infection Outcome: Not Progressing Goal: Diagnostic test results will improve Outcome: Not Progressing Goal: Respiratory complications will improve Outcome: Not Progressing Goal: Cardiovascular complication will be avoided Outcome: Not Progressing   Problem: Activity: Goal: Risk for activity intolerance will decrease Outcome: Not Progressing   Problem: Nutrition: Goal: Adequate nutrition will be maintained Outcome: Not Progressing   Problem: Coping: Goal: Level of anxiety will decrease Outcome: Not Progressing   Problem: Elimination: Goal: Will not experience complications related to bowel motility Outcome: Not Progressing Goal: Will not experience complications related to urinary retention Outcome: Not Progressing   Problem: Pain Managment: Goal: General experience of comfort will improve Outcome: Not Progressing   Problem: Safety: Goal:  Ability to remain free from injury will improve Outcome: Not Progressing   Problem: Skin Integrity: Goal: Risk for impaired skin integrity will decrease Outcome: Not Progressing   Problem: Activity: Goal: Interest or engagement in leisure activities will improve Outcome: Not Progressing   Problem: Coping: Goal: Coping ability will improve Outcome: Not Progressing   Problem: Education: Goal: Will be free of psychotic symptoms Outcome: Not Progressing   

## 2022-11-27 NOTE — [facility] Group Notes (Signed)
[facility] Group Notes:  (Nursing/MHT/Case Management/Adjunct)  Date:  11/27/2022  Time:  8:41 PM  Type of Therapy:   Wrap up  Participation Level:  Did Not Attend   Adam [name] 11/27/2022, 8:41 PM

## 2022-11-27 NOTE — Progress Notes (Signed)
Recreation Therapy Notes   Date: 11/27/2022  Time: 10:25 am  Location: Craft room     Behavioral response: N/A   Intervention Topic: Leisure   Discussion/Intervention: Patient refused to attend group.   Clinical Observations/Feedback:  Patient refused to attend group.    Chelcey Caputo LRT/CTRS         Ted Leonhart 11/27/2022 11:31 AM

## 2022-11-27 NOTE — Group Note (Signed)
LCSW Group Therapy Note  Group Date: 11/27/2022 Start Time: 1300 End Time: 1400   Type of Therapy and Topic:  Group Therapy - How To Cope with Nervousness about Discharge   Participation Level:  Did Not Attend   Description of Group This process group involved identification of patients' feelings about discharge. Some of them are scheduled to be discharged soon, while others are new admissions, but each of them was asked to share thoughts and feelings surrounding discharge from the hospital. One common theme was that they are excited at the prospect of going home, while another was that many of them are apprehensive about sharing why they were hospitalized. Patients were given the opportunity to discuss these feelings with their peers in preparation for discharge.  Therapeutic Goals  Patient will identify their overall feelings about pending discharge. Patient will think about how they might proactively address issues that they believe will once again arise once they get home (i.e. with parents). Patients will participate in discussion about having hope for change.   Summary of Patient Progress:   Patient declined to attend group, though invited by this clinician.    Therapeutic Modalities Cognitive Behavioral Therapy   Draysen Weygandt J Sharin Altidor, LCSWA 11/27/2022  1:31 PM   

## 2022-11-27 NOTE — Progress Notes (Signed)
[facility] MD Progress Note  11/27/2022 8:27 AM Adam [name]  MRN:  [id number] Subjective:  12/26 Patient remarks that his mood is "yeah."  Denies any concerns today.  Was seen utilizing the phone today.  No outburst today.  Unable to determine if he slept well last night, he does not respond to this question.  Eats well on the unit.  No falls.  12/25 Patient was seen resting comfortably in his room today.  When asked about how he is feeling, he remarks "yeah" while giving me a thumbs up.  Shows me Christmas wrapping paper in the corner of his room and then points to his sweatshirt, indicating that this was a gift to him.  He is pleasant and cooperative, no episodes of aggression in the past 24 hours.  Denies depression.  Slept well.  He was seen using the phone this morning.  12/24 Per report, patient woke up around 2:00 this morning and then proceeded to yell, but was re-directable. He laughs at this this morning.  He is bright and alert.  He is not aggressive.  Cannot tell me why he was yelling last night.  Taking his medications.  Denies psychosis.  No new medical problems.  Points to his hockey T-shirt again again tells me that he likes the [place].  Suicidal or homicidal thoughts.  12/23 It is reported by staff and nursing, that patient was restless last evening but then settled down and was able to go back to sleep.  He was not aggressive.  He is compliant with his medications.  Today, he was seen watching football pleasantly but would not answer any questions.  He will later comes up to me and gestures that he would like to talk.  When asked about his mood, he replies no.  He repeats "future" several times.  Points to his hockey T-shirt and then gives a thumbs up.  Denies inward anger, depression irritability or anxiety today.  Denies symptoms of psychosis.  No new medical problems.  Eating well.   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[place])   Stroke (HCC)   HTN (hypertension)   HLD (hyperlipidemia)   CAD (coronary artery disease)   Chronic systolic CHF (congestive heart failure) (HCC)   Anxiety   Infection of left earlobe  Total Time spent with patient: 22 minutes  Past Psychiatric History: Past history of schizophrenia  Past Medical History:  Past Medical History:  Diagnosis Date   Myocardial infarction ([place])    Schizophrenia ([place])    Stroke ([place])     Past Surgical History:  Procedure Laterality Date   NO PAST SURGERIES     Family History:  Family History  Problem Relation Age of Onset   Hypertension Mother    Family Psychiatric  History: See previous Social History:  Social History   Substance and Sexual Activity  Alcohol Use Not Currently     Social History   Substance and Sexual Activity  Drug Use Not Currently    Social History   Socioeconomic History   Marital status: Single    Spouse name: Not on file   Number of children: Not on file   Years of education: Not on file   Highest education level: Not on file  Occupational History   Not on file  Tobacco Use   Smoking status: Never  Passive exposure: Never   Smokeless tobacco: Never  Vaping Use   Vaping Use: Unknown  Substance and Sexual Activity   Alcohol use: Not Currently   Drug use: Not Currently   Sexual activity: Not Currently  Other Topics Concern   Not on file  Social History Narrative   Not on file   Social Determinants of Health   Financial Resource Strain: Not on file  Food Insecurity: Not on file  Transportation Needs: Not on file  Physical Activity: Not on file  Stress: Not on file  Social Connections: Not on file   Additional Social History:  Specify valuables returned:  (none)                      Sleep: Fair  Appetite:   Fair  Current Medications: Current Facility-Administered Medications  Medication Dose Route Frequency Provider Last Rate Last Admin   acetaminophen (TYLENOL) tablet 650 mg  650 mg Oral Q6H PRN [name], [name], MD   650 mg at 11/15/22 1239   alum & mag hydroxide-simeth (MAALOX/MYLANTA) 200-200-20 MG/5ML suspension 30 mL  30 mL Oral Q4H PRN [name], [name], MD   30 mL at 04/26/22 [id number]   aspirin EC tablet 81 mg  81 mg Oral Daily [name], [name], MD   81 mg at 11/27/22 0750   atorvastatin (LIPITOR) tablet 20 mg  20 mg Oral Daily [name], [name], MD   20 mg at 11/27/22 0750   atropine 1 % ophthalmic solution 1 drop  1 drop Sublingual QID [name], MD   1 drop at 11/27/22 0749   benztropine (COGENTIN) tablet 0.5 mg  0.5 mg Oral BID [name], [name], MD   0.5 mg at 11/27/22 0750   clonazePAM (KLONOPIN) tablet 1 mg  1 mg Oral TID PRN He, [name], MD   1 mg at 11/25/22 0602   cloZAPine (CLOZARIL) tablet 300 mg  300 mg Oral QHS [name], [name], MD   300 mg at 11/26/22 2136   diphenhydrAMINE (BENADRYL) capsule 50 mg  50 mg Oral Q6H PRN [name], [name], MD   50 mg at 11/21/22 2132   Or   diphenhydrAMINE (BENADRYL) injection 50 mg  50 mg Intramuscular Q6H PRN [name], [name], MD       feeding supplement (ENSURE ENLIVE / ENSURE PLUS) liquid 237 mL  237 mL Oral BID BM [name], [name], MD   237 mL at 11/27/22 0800   gabapentin (NEURONTIN) capsule 300 mg  300 mg Oral TID [name], [name], MD   300 mg at 11/26/22 2030   haloperidol (HALDOL) tablet 2 mg  2 mg Oral TID [name], [name], MD   2 mg at 11/26/22 2030   haloperidol (HALDOL) tablet 5 mg  5 mg Oral Q6H PRN [name], [name], MD   5 mg at 11/21/22 2132   Or   haloperidol lactate (HALDOL) injection 5 mg  5 mg Intramuscular Q6H PRN [name], [name], MD       lisinopril (ZESTRIL) tablet 5 mg  5 mg Oral Daily [name], [name], MD   5 mg at 11/27/22 0750   magnesium hydroxide (MILK OF MAGNESIA) suspension 30 mL  30 mL Oral Daily PRN [name], [name], MD   30 mL at  11/03/22 0803   metoprolol succinate (TOPROL-XL) 24 hr tablet 25 mg  25 mg Oral Daily [name], [name], MD   25 mg at 11/27/22 0750   ondansetron (ZOFRAN)  tablet 4 mg  4 mg Oral Q8H PRN Deloria Lair, NP   4 mg at 09/06/22 2320   paliperidone (INVEGA SUSTENNA) injection 156 mg  156 mg Intramuscular Q28 days [name], [name], MD   156 mg at 11/20/22 1741   paliperidone (INVEGA) 24 hr tablet 6 mg  6 mg Oral QHS [name], [name], MD   6 mg at 11/26/22 2135   senna-docusate (Senokot-S) tablet 2 tablet  2 tablet Oral QHS PRN [name], DO   2 tablet at 09/15/22 2110   temazepam (RESTORIL) capsule 15 mg  15 mg Oral QHS [name], [name], MD   15 mg at 11/26/22 2134   ziprasidone (GEODON) injection 20 mg  20 mg Intramuscular Q12H PRN [name], [name], MD   20 mg at 07/08/22 1453    Lab Results:  Results for orders placed or performed during the hospital encounter of 03/19/22 (from the past 48 hour(s))  CBC with Differential/Platelet     Status: Abnormal   Collection Time: 11/25/22 11:27 AM  Result Value Ref Range   WBC 5.4 4.0 - 10.5 K/uL   RBC 4.17 (L) 4.22 - 5.81 MIL/uL   Hemoglobin 13.0 13.0 - 17.0 g/dL   HCT 37.8 (L) 39.0 - 52.0 %   MCV 90.6 80.0 - 100.0 fL   MCH 31.2 26.0 - 34.0 pg   MCHC 34.4 30.0 - 36.0 g/dL   RDW 12.5 11.5 - 15.5 %   Platelets 198 150 - 400 K/uL   nRBC 0.0 0.0 - 0.2 %   Neutrophils Relative % 70 %   Neutro Abs 3.7 1.7 - 7.7 K/uL   Lymphocytes Relative 18 %   Lymphs Abs 1.0 0.7 - 4.0 K/uL   Monocytes Relative 10 %   Monocytes Absolute 0.5 0.1 - 1.0 K/uL   Eosinophils Relative 0 %   Eosinophils Absolute 0.0 0.0 - 0.5 K/uL   Basophils Relative 2 %   Basophils Absolute 0.1 0.0 - 0.1 K/uL   Immature Granulocytes 0 %   Abs Immature Granulocytes 0.02 0.00 - 0.07 K/uL    Comment: Performed at [facility], [address] [place], [place] [address]    Blood Alcohol level:  Lab Results  Component Value Date   [facility] <10 [id number]    Metabolic  Disorder Labs: Lab Results  Component Value Date   HGBA1C 5.5 04/10/2022   MPG 111.15 04/10/2022   No results found for: "PROLACTIN" Lab Results  Component Value Date   CHOL 167 11/20/2021   TRIG 107 11/20/2021   HDL 26 (L) 11/20/2021   CHOLHDL 6.4 11/20/2021   VLDL 21 11/20/2021   LDLCALC 120 (H) 11/20/2021    Physical Findings: AIMS: Facial and Oral Movements Muscles of Facial Expression: None, normal Lips and Perioral Area: None, normal Jaw: None, normal Tongue: None, normal,Extremity Movements Upper (arms, wrists, hands, fingers): None, normal Lower (legs, knees, ankles, toes): None, normal, Trunk Movements Neck, shoulders, hips: None, normal, Overall Severity Severity of abnormal movements (highest score from questions above): None, normal Incapacitation due to abnormal movements: None, normal Patient's awareness of abnormal movements (rate only patient's report): No Awareness, Dental Status Current problems with teeth and/or dentures?: No Does patient usually wear dentures?: No  CIWA:    COWS:     Musculoskeletal: Strength & Muscle Tone: within normal limits Gait & Station: normal Patient leans: N/A  Psychiatric Specialty Exam:  Presentation  General Appearance:  Appropriate for Environment; Casual; Neat; Well Groomed  Eye Contact: Fair  Speech: Slow  Speech Volume: Decreased  Handedness: Left   Mood and Affect  Mood: "Yea" Affect: neutral   Thought Process  Thought Processes: Goal Directed  Descriptions of Associations:Circumstantial  Orientation:Partial  Thought Content:Scattered  History of Schizophrenia/Schizoaffective disorder:Yes   Sensorium  Memory: Remote Good  Judgment: Fair  Insight: Fair   Art therapist  Concentration: Fair  Attention Span: poor  Recall: Fair  Fund of Knowledge: Fair  Language: Fair    Physical Exam: Physical Exam Vitals and nursing note reviewed.  Constitutional:       Appearance: Normal appearance.  HENT:     Head: Normocephalic and atraumatic.     Mouth/Throat:     Pharynx: Oropharynx is clear.  Eyes:     Pupils: Pupils are equal, round, and reactive to light.  Cardiovascular:     Rate and Rhythm: Normal rate and regular rhythm.  Pulmonary:     Effort: Pulmonary effort is normal.     Breath sounds: Normal breath sounds.  Abdominal:     General: Abdomen is flat.     Palpations: Abdomen is soft.  Musculoskeletal:        General: Normal range of motion.  Skin:    General: Skin is warm and dry.  Neurological:     General: No focal deficit present.     Mental Status: He is alert. Mental status is at baseline.  Psychiatric:        Attention and Perception: Attention normal.        Mood and Affect: Mood normal. Affect is blunt.        Speech: He is noncommunicative.    Review of Systems  Unable to perform ROS: Language   Blood pressure 113/82, pulse 96, temperature 98.4 F (36.9 C), temperature source Oral, resp. rate 20, height 5\' 8"  (1.727 m), weight 85.8 kg, SpO2 99 %. Body mass index is 28.76 kg/m.   Treatment Plan Summary: Plan remains completely stable.  No change to overall treatment plan.  Effort continues to be focused on discharge 12/26 ANC 3.7 on 11/25/2022  12/25 Pt stable for discharge. Pending placement.  12/24 No changes  12/23 No changes    1/24, MD 11/27/2022, 8:27 AM

## 2022-11-27 NOTE — Plan of Care (Signed)
  Problem: Education: Goal: Ability to state activities that reduce stress will improve Outcome: Not Progressing   Problem: Coping: Goal: Ability to identify and develop effective coping behavior will improve Outcome: Not Progressing   Problem: Self-Concept: Goal: Ability to identify factors that promote anxiety will improve Outcome: Not Progressing Goal: Level of anxiety will decrease Outcome: Not Progressing Goal: Ability to modify response to factors that promote anxiety will improve Outcome: Not Progressing   Problem: Education: Goal: Knowledge of General Education information will improve Description: Including pain rating scale, medication(s)/side effects and non-pharmacologic comfort measures Outcome: Not Progressing   Problem: Health Behavior/Discharge Planning: Goal: Ability to manage health-related needs will improve Outcome: Not Progressing   Problem: Clinical Measurements: Goal: Ability to maintain clinical measurements within normal limits will improve Outcome: Not Progressing Goal: Will remain free from infection Outcome: Not Progressing Goal: Diagnostic test results will improve Outcome: Not Progressing Goal: Respiratory complications will improve Outcome: Not Progressing Goal: Cardiovascular complication will be avoided Outcome: Not Progressing   Problem: Activity: Goal: Risk for activity intolerance will decrease Outcome: Not Progressing   Problem: Nutrition: Goal: Adequate nutrition will be maintained Outcome: Not Progressing   Problem: Coping: Goal: Level of anxiety will decrease Outcome: Not Progressing   Problem: Elimination: Goal: Will not experience complications related to bowel motility Outcome: Not Progressing Goal: Will not experience complications related to urinary retention Outcome: Not Progressing   Problem: Pain Managment: Goal: General experience of comfort will improve Outcome: Not Progressing   Problem: Safety: Goal:  Ability to remain free from injury will improve Outcome: Not Progressing   Problem: Skin Integrity: Goal: Risk for impaired skin integrity will decrease Outcome: Not Progressing   Problem: Activity: Goal: Interest or engagement in leisure activities will improve Outcome: Not Progressing   Problem: Coping: Goal: Coping ability will improve Outcome: Not Progressing   Problem: Education: Goal: Will be free of psychotic symptoms Outcome: Not Progressing   

## 2022-11-27 NOTE — Plan of Care (Signed)
  Problem: Education: Goal: Ability to state activities that reduce stress will improve Outcome: Progressing   Problem: Coping: Goal: Ability to identify and develop effective coping behavior will improve Outcome: Progressing   Problem: Self-Concept: Goal: Level of anxiety will decrease Outcome: Progressing   Problem: Clinical Measurements: Goal: Cardiovascular complication will be avoided Outcome: Progressing   Problem: Activity: Goal: Risk for activity intolerance will decrease Outcome: Progressing   Problem: Coping: Goal: Level of anxiety will decrease Outcome: Progressing   Problem: Pain Managment: Goal: General experience of comfort will improve Outcome: Progressing   Problem: Education: Goal: Will be free of psychotic symptoms Outcome: Progressing

## 2022-11-27 NOTE — Progress Notes (Signed)
D- Patient alert and oriented. Affect/mood. Denies SI, HI, AVH, and pain. Quotes. Goal. How did they do with achieving previous goal. A- Scheduled medications administered to patient, per MD orders. Support and encouragement provided.  Routine safety checks conducted every 15 minutes.  Patient informed to notify staff with problems or concerns. R- No adverse drug reactions noted. Patient contracts for safety at this time. Patient compliant with medications and treatment plan. Patient receptive, calm, and cooperative. Patient interacts well with others on the unit.  Patient remains safe at this time. 

## 2022-11-28 NOTE — Progress Notes (Addendum)
[facility] MD Progress Note  11/28/2022 11:43 AM Adam [name]  MRN:  [id number] Subjective:  12/27 Patient is cheerful this morning.  He is bright.  Waves at me.  He has his [employer] sweatshirt on.  No episodes of yelling today.  Slept well.  Taking his medications. Moodwise, replies: "yea."  12/26 Patient remarks that his mood is "yeah."  Denies any concerns today.  Was seen utilizing the phone today.  No outburst today.  Unable to determine if he slept well last night, he does not respond to this question.  Eats well on the unit.  No falls.  12/25 Patient was seen resting comfortably in his room today.  When asked about how he is feeling, he remarks "yeah" while giving me a thumbs up.  Shows me Christmas wrapping paper in the corner of his room and then points to his sweatshirt, indicating that this was a gift to him.  He is pleasant and cooperative, no episodes of aggression in the past 24 hours.  Denies depression.  Slept well.  He was seen using the phone this morning.  12/24 Per report, patient woke up around 2:00 this morning and then proceeded to yell, but was re-directable. He laughs at this this morning.  He is bright and alert.  He is not aggressive.  Cannot tell me why he was yelling last night.  Taking his medications.  Denies psychosis.  No new medical problems.  Points to his hockey T-shirt again again tells me that he likes the [employer].  Suicidal or homicidal thoughts.  12/23 It is reported by staff and nursing, that patient was restless last evening but then settled down and was able to go back to sleep.  He was not aggressive.  He is compliant with his medications.  Today, he was seen watching football pleasantly but would not answer any questions.  He will later comes up to me and gestures that he would like to talk.  When asked about his mood, he replies no.  He repeats "future" several times.  Points to his hockey T-shirt and then gives a thumbs up.  Denies inward anger, depression  irritability or anxiety today.  Denies symptoms of psychosis.  No new medical problems.  Eating well.   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)   Stroke (HCC)   HTN (hypertension)   HLD (hyperlipidemia)   CAD (coronary artery disease)   Chronic systolic CHF (congestive heart failure) (HCC)   Anxiety   Infection of left earlobe  Total Time spent with patient: 21 minutes  Past Psychiatric History: Past history of schizophrenia  Past Medical History:  Past Medical History:  Diagnosis Date   Myocardial infarction (HCC)    Schizophrenia (HCC)    Stroke (HCC)     Past Surgical History:  Procedure Laterality Date   NO PAST SURGERIES     Family History:  Family History  Problem Relation Age of Onset   Hypertension Mother    Family Psychiatric  History: See previous Social History:  Social History   Substance and Sexual Activity  Alcohol Use Not Currently     Social History   Substance and Sexual Activity  Drug Use Not Currently    Social History   Socioeconomic History   Marital status: Single    Spouse name: Not on file  Number of children: Not on file   Years of education: Not on file   Highest education level: Not on file  Occupational History   Not on file  Tobacco Use   Smoking status: Never    Passive exposure: Never   Smokeless tobacco: Never  Vaping Use   Vaping Use: Unknown  Substance and Sexual Activity   Alcohol use: Not Currently   Drug use: Not Currently   Sexual activity: Not Currently  Other Topics Concern   Not on file  Social History Narrative   Not on file   Social Determinants of Health   Financial Resource Strain: Not on file  Food Insecurity: Not on file  Transportation Needs: Not on file  Physical  Activity: Not on file  Stress: Not on file  Social Connections: Not on file   Additional Social History:  Specify valuables returned:  (none)                      Sleep: Fair  Appetite:  Fair  Current Medications: Current Facility-Administered Medications  Medication Dose Route Frequency Provider Last Rate Last Admin   acetaminophen (TYLENOL) tablet 650 mg  650 mg Oral Q6H PRN [name], [name], MD   650 mg at 11/15/22 1239   alum & mag hydroxide-simeth (MAALOX/MYLANTA) 200-200-20 MG/5ML suspension 30 mL  30 mL Oral Q4H PRN [name], [name], MD   30 mL at 04/26/22 9794   aspirin EC tablet 81 mg  81 mg Oral Daily [name], [name], MD   81 mg at 11/28/22 0933   atorvastatin (LIPITOR) tablet 20 mg  20 mg Oral Daily [name], [name], MD   20 mg at 11/28/22 0937   atropine 1 % ophthalmic solution 1 drop  1 drop Sublingual QID [name], MD   1 drop at 11/28/22 8016   benztropine (COGENTIN) tablet 0.5 mg  0.5 mg Oral BID [name], [name], MD   0.5 mg at 11/28/22 0933   clonazePAM (KLONOPIN) tablet 1 mg  1 mg Oral TID PRN He, [name], MD   1 mg at 11/25/22 0602   cloZAPine (CLOZARIL) tablet 300 mg  300 mg Oral QHS [name], [name], MD   300 mg at 11/27/22 2135   diphenhydrAMINE (BENADRYL) capsule 50 mg  50 mg Oral Q6H PRN [name], [name], MD   50 mg at 11/21/22 2132   Or   diphenhydrAMINE (BENADRYL) injection 50 mg  50 mg Intramuscular Q6H PRN [name], [name], MD       feeding supplement (ENSURE ENLIVE / ENSURE PLUS) liquid 237 mL  237 mL Oral BID BM [name], [name], MD   237 mL at 11/28/22 0941   gabapentin (NEURONTIN) capsule 300 mg  300 mg Oral TID [name], [name], MD   300 mg at 11/28/22 0933   haloperidol (HALDOL) tablet 2 mg  2 mg Oral TID [name], [name], MD   2 mg at 11/28/22 0933   haloperidol (HALDOL) tablet 5 mg  5 mg Oral Q6H PRN [name], [name], MD   5 mg at 11/21/22 2132   Or   haloperidol lactate (HALDOL) injection 5 mg  5 mg Intramuscular Q6H PRN [name], [name], MD        lisinopril (ZESTRIL) tablet 5 mg  5 mg Oral Daily [name], [name], MD   5 mg at 11/28/22 0937   magnesium hydroxide (MILK OF MAGNESIA) suspension 30 mL  30 mL Oral Daily PRN  [name], [name], MD   30 mL at 11/03/22 0803   metoprolol succinate (TOPROL-XL) 24 hr tablet 25 mg  25 mg Oral Daily [name], [name], MD   25 mg at 11/28/22 0937   ondansetron (ZOFRAN) tablet 4 mg  4 mg Oral Q8H PRN Jearld Lesch, NP   4 mg at 09/06/22 2320   paliperidone (INVEGA SUSTENNA) injection 156 mg  156 mg Intramuscular Q28 days [name], [name], MD   156 mg at 11/20/22 1741   paliperidone (INVEGA) 24 hr tablet 6 mg  6 mg Oral QHS [name], [name], MD   6 mg at 11/27/22 2134   senna-docusate (Senokot-S) tablet 2 tablet  2 tablet Oral QHS PRN Sarina Ill, DO   2 tablet at 09/15/22 2110   temazepam (RESTORIL) capsule 15 mg  15 mg Oral QHS [name], [name], MD   15 mg at 11/27/22 2135   ziprasidone (GEODON) injection 20 mg  20 mg Intramuscular Q12H PRN [name], [name], MD   20 mg at 07/08/22 1453    Lab Results: No results found for this or any previous visit (from the past 48 hour(s)).   Blood Alcohol level:  Lab Results  Component Value Date   ETH <10 07/20/2021    Metabolic Disorder Labs: Lab Results  Component Value Date   HGBA1C 5.5 04/10/2022   MPG 111.15 04/10/2022   No results found for: "PROLACTIN" Lab Results  Component Value Date   CHOL 167 11/20/2021   TRIG 107 11/20/2021   HDL 26 (L) 11/20/2021   CHOLHDL 6.4 11/20/2021   VLDL 21 11/20/2021   LDLCALC 120 (H) 11/20/2021    Physical Findings: AIMS: Facial and Oral Movements Muscles of Facial Expression: None, normal Lips and Perioral Area: None, normal Jaw: None, normal Tongue: None, normal,Extremity Movements Upper (arms, wrists, hands, fingers): None, normal Lower (legs, knees, ankles, toes): None, normal, Trunk Movements Neck, shoulders, hips: None, normal, Overall Severity Severity of abnormal movements (highest score  from questions above): None, normal Incapacitation due to abnormal movements: None, normal Patient's awareness of abnormal movements (rate only patient's report): No Awareness, Dental Status Current problems with teeth and/or dentures?: No Does patient usually wear dentures?: No  CIWA:    COWS:     Musculoskeletal: Strength & Muscle Tone: within normal limits Gait & Station: normal Patient leans: N/A  Psychiatric Specialty Exam:  Presentation  General Appearance:  Appropriate for Environment; Casual; Neat; Well Groomed  Eye Contact: Fair  Speech: Slow  Speech Volume: Decreased  Handedness: Left   Mood and Affect  Mood: "Yea" Affect: bright   Thought Process  Thought Processes: Goal Directed  Descriptions of Associations:Circumstantial  Orientation:Partial  Thought Content:Scattered  History of Schizophrenia/Schizoaffective disorder:Yes   Sensorium  Memory: Remote Good  Judgment: Fair  Insight: Fair   Art therapist  Concentration: Fair  Attention Span: poor  Recall: Fair  Fund of Knowledge: Fair  Language: Fair    Physical Exam: Physical Exam Vitals and nursing note reviewed.  Constitutional:      Appearance: Normal appearance.  HENT:     Head: Normocephalic and atraumatic.     Mouth/Throat:     Pharynx: Oropharynx is clear.  Eyes:     Pupils: Pupils are equal, round, and reactive to light.  Cardiovascular:     Rate and Rhythm: Normal rate and regular rhythm.  Pulmonary:     Effort: Pulmonary effort is normal.     Breath sounds: Normal breath sounds.  Abdominal:     General: Abdomen is flat.     Palpations: Abdomen is soft.  Musculoskeletal:        General: Normal range of motion.  Skin:    General: Skin is warm and dry.  Neurological:     General: No focal deficit present.     Mental Status: He is alert. Mental status is at baseline.  Psychiatric:        Attention and Perception: Attention normal.         Mood and Affect: Mood normal. Affect is blunt.        Speech: He is noncommunicative.    Review of Systems  Unable to perform ROS: Language   Blood pressure 113/80, pulse (!) 104, temperature 98.3 F (36.8 C), temperature source Oral, resp. rate 20, height 5\' 8"  (1.727 m), weight 85.8 kg, SpO2 99 %. Body mass index is 28.76 kg/m.   Treatment Plan Summary: Plan remains completely stable.  No change to overall treatment plan.  Effort continues to be focused on discharge 12/27 No changes  12/26 ANC 3.7 on 11/25/2022  12/25 Pt stable for discharge. Pending placement.  12/24 No changes  12/23 No changes    1/24, MD 11/28/2022, 11:43 AM

## 2022-11-28 NOTE — Progress Notes (Signed)
Pt denies SI/HI/AVH and verbally agrees to approach staff if these become apparent or before harming themselves/others. Pt has seemed to be not as happy as he has been from report. Pt has been in his room for most of the day. Scheduled medications administered to pt, per MD orders. RN provided support and encouragement to pt. Q15 min safety checks implemented and continued. Pt safe on the unit. RN will continue to monitor and intervene as needed.  11/28/22 0936  Psych Admission Type (Psych Patients Only)  Admission Status Involuntary  Psychosocial Assessment  Patient Complaints None  Eye Contact Fair  Facial Expression Sad  Affect Appropriate to circumstance  Speech Aphasic  Interaction Childlike  Motor Activity Pacing  Appearance/Hygiene Disheveled  Behavior Characteristics Cooperative;Appropriate to situation;Pacing  Mood Sad  Thought Process  Coherency Circumstantial  Content WDL  Delusions None reported or observed  Perception WDL  Hallucination None reported or observed  Judgment Impaired  Confusion None  Danger to Self  Current suicidal ideation? Denies  Danger to Others  Danger to Others None reported or observed  Danger to Others Abnormal  Harmful Behavior to others No threats or harm toward other people  Destructive Behavior No threats or harm toward property

## 2022-11-28 NOTE — Plan of Care (Signed)
°  Problem: Group Participation °Goal: STG - Patient will engage in groups with a calm and appropriate mood at least 2x within 5 recreation therapy group sessions °Description: STG - Patient will engage in groups with a calm and appropriate mood at least 2x within 5 recreation therapy group sessions °Outcome: Progressing °  °

## 2022-11-28 NOTE — Group Note (Signed)
[facility] LCSW Group Therapy Note   Group Date: 11/28/2022 Start Time: 1300 End Time: 1400   Type of Therapy/Topic:  Group Therapy:  Emotion Regulation  Participation Level:  Did Not Attend    Description of Group:    The purpose of this group is to assist patients in learning to regulate negative emotions and experience positive emotions. Patients will be guided to discuss ways in which they have been vulnerable to their negative emotions. These vulnerabilities will be juxtaposed with experiences of positive emotions or situations, and patients challenged to use positive emotions to combat negative ones. Special emphasis will be placed on coping with negative emotions in conflict situations, and patients will process healthy conflict resolution skills.  Therapeutic Goals: Patient will identify two positive emotions or experiences to reflect on in order to balance out negative emotions:  Patient will label two or more emotions that they find the most difficult to experience:  Patient will be able to demonstrate positive conflict resolution skills through discussion or role plays:   Summary of Patient Progress: Patient declined to attend group despite invitation.     Therapeutic Modalities:   Cognitive Behavioral Therapy Feelings Identification Dialectical Behavioral Therapy   Atarah Cadogan R Milta Croson, LCSW 

## 2022-11-28 NOTE — Plan of Care (Signed)
  Problem: Self-Concept: Goal: Ability to identify factors that promote anxiety will improve Outcome: Progressing Goal: Level of anxiety will decrease Outcome: Progressing   Problem: Education: Goal: Knowledge of General Education information will improve Description: Including pain rating scale, medication(s)/side effects and non-pharmacologic comfort measures Outcome: Progressing

## 2022-11-28 NOTE — NC FL2 (Signed)
[place] MEDICAID FL2 LEVEL OF CARE FORM     IDENTIFICATION  Patient Name: Maddix Heinz Birthdate: 03/24/1961 Sex: male Admission Date (Current Location): 03/19/2022  [facility] and [place] Number:  Chiropodist and Address:  [facility], [address], [place], [place] [address]      Provider Number: [id number]  Attending Physician Name and Address:  Audery Amel, MD  Relative Name and Phone Number:  [place], mother, [phone]    Current Level of Care: Hospital Recommended Level of Care: Assisted Living Facility, Family Care Home, Other (Comment) (Group Home) Prior Approval Number:    Date Approved/Denied:   PASRR Number:    Discharge Plan: Other (Comment) (ALF, 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   Schizophrenia, chronic condition with acute exacerbation (HCC) 07/20/2021    Orientation RESPIRATION BLADDER Height & Weight     Self, Situation  Normal Continent Weight: 189 lb 2.5 oz (85.8 kg) Height:  5\' 8"  (172.7 cm)  BEHAVIORAL SYMPTOMS/MOOD NEUROLOGICAL BOWEL NUTRITION STATUS  Other (Comment) (Cooperative)  (TBI) Continent Diet (Regular)  AMBULATORY STATUS COMMUNICATION OF NEEDS Skin   Independent Verbally Normal                       Personal Care Assistance Level of Assistance  Bathing (Patient will ask for assistance with shaving.) Bathing Assistance: Independent Feeding assistance:  Independent Dressing Assistance: Independent Total Care Assistance: Independent   Functional Limitations Info  Speech (Some right side weakness following a stroke, however, does not impair patient from completing tasks, he reaches and uses his left side more.  Pt has aphasia but able to understand commands.) Sight Info: Impaired (Some right weakness following a stroke, however, does not impair patient from completing tasks, he reaches and uses his left side more.) Hearing Info: Adequate Speech Info: Impaired (Patient has aphasia, speech is limited. Patient understands directions and can follow them but has some difficulty in processing and struggles with expressing himself.)    SPECIAL CARE FACTORS FREQUENCY   (NA)                    Contractures Contractures Info: Not present    Additional Factors Info  Code Status, Allergies Code Status Info: Full Allergies Info: Morphine, NSAIDS Psychotropic Info: Haldol, Clozaril, Restoril         Current Medications (11/28/2022):  This is the current hospital active medication list Current Facility-Administered Medications  Medication Dose Route Frequency Provider Last Rate Last Admin   acetaminophen (TYLENOL) tablet 650 mg  650 mg Oral Q6H PRN [name], [name], MD   650 mg at 11/15/22 1239   alum & mag hydroxide-simeth (MAALOX/MYLANTA) 200-200-20 MG/5ML suspension 30 mL  30 mL Oral Q4H PRN [name], 06-[birth year]-2001, MD   30 mL at 04/26/22 0721   aspirin EC tablet 81 mg  81 mg Oral Daily [name], 04/28/22, MD   81 mg at 11/28/22 0933   atorvastatin (LIPITOR) tablet 20 mg  20 mg Oral Daily [name], [name], MD   20 mg at 11/28/22 0937   atropine 1 % ophthalmic solution 1 drop  1 drop Sublingual QID [name], MD   1 drop at 11/28/22 1201   benztropine (COGENTIN) tablet 0.5 mg  0.5 mg Oral BID [name], [name], MD   0.5 mg at 11/28/22 0933   clonazePAM (KLONOPIN) tablet 1 mg  1 mg Oral TID PRN He, [name], MD   1 mg at 11/25/22 0602   cloZAPine  (CLOZARIL) tablet 300 mg  300 mg Oral QHS [name], [name], MD   300 mg at 11/27/22 2135   diphenhydrAMINE (BENADRYL) capsule 50 mg  50 mg Oral Q6H PRN [name], [name], MD   50 mg at 11/21/22 2132   Or   diphenhydrAMINE (BENADRYL) injection 50 mg  50 mg Intramuscular Q6H PRN [name], [name], MD       feeding supplement (ENSURE ENLIVE / ENSURE PLUS) liquid 237 mL  237 mL Oral BID BM [name], [name], MD   237 mL at 11/28/22 1344   gabapentin (NEURONTIN) capsule 300 mg  300 mg Oral TID [name], [name], MD   300 mg at 11/28/22 1504   haloperidol (HALDOL) tablet 2 mg  2 mg Oral TID [name], [name], MD   2 mg at 11/28/22 1504   haloperidol (HALDOL) tablet 5 mg  5 mg Oral Q6H PRN [name], [name], MD   5 mg at 11/21/22 2132   Or   haloperidol lactate (HALDOL) injection 5 mg  5 mg Intramuscular Q6H PRN [name], [name], MD       lisinopril (ZESTRIL) tablet 5 mg  5 mg Oral Daily [name], [name], MD   5 mg at 11/28/22 9211   magnesium hydroxide (MILK OF MAGNESIA) suspension 30 mL  30 mL Oral Daily PRN [name], [name], MD   30 mL at 11/03/22 0803   metoprolol succinate (TOPROL-XL) 24 hr tablet 25 mg  25 mg Oral Daily [name], [name], MD   25 mg at 11/28/22 0937   ondansetron (ZOFRAN) tablet 4 mg  4 mg Oral Q8H PRN Lerry Liner M, NP   4 mg at 09/06/22 2320   paliperidone (INVEGA SUSTENNA) injection 156 mg  156 mg Intramuscular Q28 days [name], [name], MD   156 mg at 11/20/22 1741   paliperidone (INVEGA) 24 hr tablet 6 mg  6 mg Oral QHS [name], [name], MD   6 mg at 11/27/22 2134   senna-docusate (Senokot-S) tablet 2 tablet  2 tablet Oral QHS PRN Sarina Ill, DO   2 tablet at 09/15/22 2110   temazepam (RESTORIL) capsule 15 mg  15 mg Oral QHS [name], [name], MD   15 mg at 11/27/22 2135   ziprasidone (GEODON) injection 20 mg  20 mg Intramuscular Q12H PRN [name], [name], MD   20 mg at 07/08/22 1453     Discharge Medications: Please see discharge summary for a list of discharge  medications.  Relevant Imaging Results:  Relevant Lab Results:   Additional Information SS#: [id number]  Harden Mo, LCSW

## 2022-11-28 NOTE — Progress Notes (Signed)
Recreation Therapy Notes      Date: 11/28/2022   Time: 10:25 am   Location: Craft room      Behavioral response: N/A   Intervention Topic: Values    Discussion/Intervention: Patient refused to attend group.    Clinical Observations/Feedback:  Patient refused to attend group.    Leonte Horrigan LRT/CTRS        Ahamed Hofland 11/28/2022 11:13 AM

## 2022-11-29 LAB — RESP PANEL BY RT-PCR (RSV, FLU A&B, COVID)  RVPGX2
Influenza A by PCR: NEGATIVE
Influenza B by PCR: NEGATIVE
Resp Syncytial Virus by PCR: NEGATIVE
SARS Coronavirus 2 by RT PCR: NEGATIVE

## 2022-11-29 NOTE — Progress Notes (Signed)
Patient compliant with medications visible in Milieu Interacting well with Peers and Staff. Q 15 minutes safety checks ongoing without self harm gestures. Support and encouragement provided.

## 2022-11-29 NOTE — Progress Notes (Signed)
Recreation Therapy Notes   Date: 11/29/2022  Time: 10:00 am  Location: Craft room     Behavioral response: N/A   Intervention Topic: Coping Skills   Discussion/Intervention: Patient refused to attend group.   Clinical Observations/Feedback:  Patient refused to attend group.    Felipe Cabell LRT/CTRS        Johne Buckle 11/29/2022 11:20 AM

## 2022-11-29 NOTE — Progress Notes (Signed)
Pt denies SI/HI/AVH and verbally agrees to approach staff if these become apparent or before harming themselves/others. Scheduled medications administered to pt, per MD orders. RN provided support and encouragement to pt. Q15 min safety checks implemented and continued. Pt safe on the unit. RN will continue to monitor and intervene as needed.  11/29/22 1005  Psych Admission Type (Psych Patients Only)  Admission Status Involuntary  Psychosocial Assessment  Patient Complaints None  Eye Contact Fair  Facial Expression Animated;Angry  Affect Labile  Speech Aphasic  Interaction Assertive;Childlike  Motor Activity Pacing  Appearance/Hygiene Improved  Behavior Characteristics Cooperative;Appropriate to situation;Anxious  Mood Labile  Thought Process  Coherency Circumstantial  Content WDL  Delusions None reported or observed  Perception WDL  Hallucination None reported or observed  Judgment Impaired  Confusion None  Danger to Self  Current suicidal ideation? Denies  Danger to Others  Danger to Others None reported or observed  Danger to Others Abnormal  Harmful Behavior to others No threats or harm toward other people  Destructive Behavior No threats or harm toward property

## 2022-11-29 NOTE — [facility] Counselor (Signed)
CSW received call from L. Oxendine [phone]) regarding meeting yesterday. She apologized as she had gotten sick over the holiday and was unable to attend the meeting. Oxendine stated that there were not any updates. She did share that she spoke with Justina from the group home, who basically stated if they were to take pt they would need to be approved for special funding in order to hire an extra person to be one to one with the pt due to mother's concerns around safety. CSW voiced understanding. He also shared update from Z. Brooks regarding contact with the [facility]. She was informed that they did not plan to advance pt on the waiting list as they were aware of efforts to find him other placement. She voiced understanding. Oxendine shared that it may be time to speak with mother about limitations around placement. CSW acknowledged that many of these have already been shared with her. She stated that she was unsure of how to proceed now and would take case to her supervisor. CSW agreed stating that it's probably time we did the same. She stated that they would be meeting next week at the standard time. No other concerns expressed. Contact ended without incident.   Vilma Meckel. Algis Greenhouse, MSW, LCSW, LCAS 11/29/2022 3:22 PM

## 2022-11-29 NOTE — Plan of Care (Signed)
  Problem: Education: Goal: Ability to state activities that reduce stress will improve Outcome: Progressing   Problem: Self-Concept: Goal: Level of anxiety will decrease Outcome: Progressing   Problem: Education: Goal: Knowledge of General Education information will improve Description: Including pain rating scale, medication(s)/side effects and non-pharmacologic comfort measures Outcome: Progressing

## 2022-11-29 NOTE — Plan of Care (Signed)
  Problem: Self-Concept: Goal: Level of anxiety will decrease Outcome: Progressing   Problem: Self-Concept: Goal: Ability to modify response to factors that promote anxiety will improve Outcome: Progressing   Problem: Nutrition: Goal: Adequate nutrition will be maintained Outcome: Progressing   Problem: Coping: Goal: Level of anxiety will decrease Outcome: Progressing   Problem: Coping: Goal: Coping ability will improve Outcome: Progressing

## 2022-11-29 NOTE — Progress Notes (Signed)
[facility] MD Progress Note  11/29/2022 4:48 PM Adam [name]  MRN:  [id number] Subjective: No change to current presentation.  No new complaints.  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[place])   Stroke (HCC)   HTN (hypertension)   HLD (hyperlipidemia)   CAD (coronary artery disease)   Chronic systolic CHF (congestive heart failure) (HCC)   Anxiety   Infection of left earlobe  Total Time spent with patient: 30 minutes  Past Psychiatric History: Past history of schizophrenia and stroke  Past Medical History:  Past Medical History:  Diagnosis Date   Myocardial infarction ([place])    Schizophrenia ([place])    Stroke ([place])     Past Surgical History:  Procedure Laterality Date   NO PAST SURGERIES     Family History:  Family History  Problem Relation Age of Onset   Hypertension Mother    Family Psychiatric  History: See previous Social History:  Social History   Substance and Sexual Activity  Alcohol Use Not Currently     Social History   Substance and Sexual Activity  Drug Use Not Currently    Social History   Socioeconomic History   Marital status: Single    Spouse name: Not on file   Number of children: Not on file   Years of education: Not on file   Highest education level: Not on file  Occupational History   Not on file  Tobacco Use   Smoking status: Never    Passive exposure: Never   Smokeless tobacco: Never  Vaping Use   Vaping Use: Unknown  Substance and Sexual Activity   Alcohol use: Not Currently   Drug use: Not Currently   Sexual activity: Not Currently  Other Topics Concern   Not on file  Social History Narrative   Not on file   Social Determinants of Health   Financial Resource Strain: Not on  file  Food Insecurity: Not on file  Transportation Needs: Not on file  Physical Activity: Not on file  Stress: Not on file  Social Connections: Not on file   Additional Social History:  Specify valuables returned:  (none)                      Sleep: Fair  Appetite:  Fair  Current Medications: Current Facility-Administered Medications  Medication Dose Route Frequency Provider Last Rate Last Admin   acetaminophen (TYLENOL) tablet 650 mg  650 mg Oral Q6H PRN Roy Tokarz T, MD   650 mg at 11/15/22 1239   alum & mag hydroxide-simeth (MAALOX/MYLANTA) 200-200-20 MG/5ML suspension 30 mL  30 mL Oral Q4H PRN Glen Kesinger T, MD   30 mL at 04/26/22 [id number]   aspirin EC tablet 81 mg  81 mg Oral Daily Enrica Corliss T, MD   81 mg at 11/29/22 1005   atorvastatin (LIPITOR) tablet 20 mg  20 mg Oral Daily Juliett Eastburn T, MD   20 mg at 11/29/22 1006   atropine 1 % ophthalmic solution 1 drop  1 drop Sublingual QID [name], MD   1 drop at 11/29/22 1632   benztropine (COGENTIN) tablet 0.5 mg  0.5 mg Oral BID Sylvio Weatherall T, MD   0.5 mg at 11/29/22 1631   clonazePAM (KLONOPIN) tablet 1 mg  1 mg Oral TID PRN  He, [name], MD   1 mg at 11/25/22 0602   cloZAPine (CLOZARIL) tablet 300 mg  300 mg Oral QHS Alyia Lacerte T, MD   300 mg at 11/28/22 2030   diphenhydrAMINE (BENADRYL) capsule 50 mg  50 mg Oral Q6H PRN Garritt Molyneux T, MD   50 mg at 11/29/22 1005   Or   diphenhydrAMINE (BENADRYL) injection 50 mg  50 mg Intramuscular Q6H PRN Mycheal Veldhuizen T, MD       feeding supplement (ENSURE ENLIVE / ENSURE PLUS) liquid 237 mL  237 mL Oral BID BM Talisa Petrak T, MD   237 mL at 11/29/22 1441   gabapentin (NEURONTIN) capsule 300 mg  300 mg Oral TID Drayden Lukas T, MD   300 mg at 11/29/22 1440   haloperidol (HALDOL) tablet 2 mg  2 mg Oral TID Antigone Crowell T, MD   2 mg at 11/29/22 1440   haloperidol (HALDOL) tablet 5 mg  5 mg Oral Q6H PRN Kinzie Wickes T, MD   5 mg at 11/21/22 2132   Or   haloperidol  lactate (HALDOL) injection 5 mg  5 mg Intramuscular Q6H PRN Tajee Savant T, MD       lisinopril (ZESTRIL) tablet 5 mg  5 mg Oral Daily Fenix Rorke T, MD   5 mg at 11/29/22 1006   magnesium hydroxide (MILK OF MAGNESIA) suspension 30 mL  30 mL Oral Daily PRN Carrisa Keller T, MD   30 mL at 11/03/22 0803   metoprolol succinate (TOPROL-XL) 24 hr tablet 25 mg  25 mg Oral Daily Khris Jansson T, MD   25 mg at 11/29/22 1006   ondansetron (ZOFRAN) tablet 4 mg  4 mg Oral Q8H PRN Deloria Lair, NP   4 mg at 09/06/22 2320   paliperidone (INVEGA SUSTENNA) injection 156 mg  156 mg Intramuscular Q28 days Koleen Celia T, MD   156 mg at 11/20/22 1741   paliperidone (INVEGA) 24 hr tablet 6 mg  6 mg Oral QHS Milo Schreier T, MD   6 mg at 11/28/22 2031   senna-docusate (Senokot-S) tablet 2 tablet  2 tablet Oral QHS PRN [name], DO   2 tablet at 09/15/22 2110   temazepam (RESTORIL) capsule 15 mg  15 mg Oral QHS Anabelen Kaminsky T, MD   15 mg at 11/28/22 2032   ziprasidone (GEODON) injection 20 mg  20 mg Intramuscular Q12H PRN Jarrette Dehner, [name], MD   20 mg at 07/08/22 1453    Lab Results:  Results for orders placed or performed during the hospital encounter of 03/19/22 (from the past 48 hour(s))  Resp panel by RT-PCR (RSV, Flu A&B, Covid) Anterior Nasal Swab     Status: None   Collection Time: 11/29/22 11:12 AM   Specimen: Anterior Nasal Swab  Result Value Ref Range   SARS Coronavirus 2 by RT PCR NEGATIVE NEGATIVE    Comment: (NOTE) SARS-CoV-2 target nucleic acids are NOT DETECTED.  The SARS-CoV-2 RNA is generally detectable in upper respiratory specimens during the acute phase of infection. The lowest concentration of SARS-CoV-2 viral copies this assay can detect is 138 copies/mL. A negative result does not preclude SARS-Cov-2 infection and should not be used as the sole basis for treatment or other patient management decisions. A negative result may occur with  improper specimen  collection/handling, submission of specimen other than nasopharyngeal swab, presence of viral mutation(s) within the areas targeted by this assay, and inadequate number of viral copies(<138 copies/mL). A  negative result must be combined with clinical observations, patient history, and epidemiological information. The expected result is Negative.  Fact Sheet for Patients:  EntrepreneurPulse.com.au  Fact Sheet for Healthcare Providers:  IncredibleEmployment.be  This test is no t yet approved or cleared by the [place] FDA and  has been authorized for detection and/or diagnosis of SARS-CoV-2 by FDA under an Emergency Use Authorization (EUA). This EUA will remain  in effect (meaning this test can be used) for the duration of the COVID-19 declaration under Section 564(b)(1) of the Act, 21 U.S.C.section 360bbb-3(b)(1), unless the authorization is terminated  or revoked sooner.       Influenza A by PCR NEGATIVE NEGATIVE   Influenza B by PCR NEGATIVE NEGATIVE    Comment: (NOTE) The Xpert Xpress SARS-CoV-2/FLU/RSV plus assay is intended as an aid in the diagnosis of influenza from Nasopharyngeal swab specimens and should not be used as a sole basis for treatment. Nasal washings and aspirates are unacceptable for Xpert Xpress SARS-CoV-2/FLU/RSV testing.  Fact Sheet for Patients: EntrepreneurPulse.com.au  Fact Sheet for Healthcare Providers: IncredibleEmployment.be  This test is not yet approved or cleared by the [place] FDA and has been authorized for detection and/or diagnosis of SARS-CoV-2 by FDA under an Emergency Use Authorization (EUA). This EUA will remain in effect (meaning this test can be used) for the duration of the COVID-19 declaration under Section 564(b)(1) of the Act, 21 U.S.C. section 360bbb-3(b)(1), unless the authorization is terminated or revoked.     Resp Syncytial Virus by PCR  NEGATIVE NEGATIVE    Comment: (NOTE) Fact Sheet for Patients: EntrepreneurPulse.com.au  Fact Sheet for Healthcare Providers: IncredibleEmployment.be  This test is not yet approved or cleared by the [place] FDA and has been authorized for detection and/or diagnosis of SARS-CoV-2 by FDA under an Emergency Use Authorization (EUA). This EUA will remain in effect (meaning this test can be used) for the duration of the COVID-19 declaration under Section 564(b)(1) of the Act, 21 U.S.C. section 360bbb-3(b)(1), unless the authorization is terminated or revoked.  Performed at [facility], [address] [place], [place] [address]     Blood Alcohol level:  Lab Results  Component Value Date   [facility] <10 [id number]    Metabolic Disorder Labs: Lab Results  Component Value Date   HGBA1C 5.5 04/10/2022   MPG 111.15 04/10/2022   No results found for: "PROLACTIN" Lab Results  Component Value Date   CHOL 167 11/20/2021   TRIG 107 11/20/2021   HDL 26 (L) 11/20/2021   CHOLHDL 6.4 11/20/2021   VLDL 21 11/20/2021   LDLCALC 120 (H) 11/20/2021    Physical Findings: AIMS: Facial and Oral Movements Muscles of Facial Expression: None, normal Lips and Perioral Area: None, normal Jaw: None, normal Tongue: None, normal,Extremity Movements Upper (arms, wrists, hands, fingers): None, normal Lower (legs, knees, ankles, toes): None, normal, Trunk Movements Neck, shoulders, hips: None, normal, Overall Severity Severity of abnormal movements (highest score from questions above): None, normal Incapacitation due to abnormal movements: None, normal Patient's awareness of abnormal movements (rate only patient's report): No Awareness, Dental Status Current problems with teeth and/or dentures?: No Does patient usually wear dentures?: No  CIWA:    COWS:     Musculoskeletal: Strength & Muscle Tone: within normal limits Gait & Station:  normal Patient leans: N/A  Psychiatric Specialty Exam:  Presentation  General Appearance:  Appropriate for Environment; Casual; Neat; Well Groomed  Eye Contact: Fair  Speech: Slow  Speech Volume: Decreased  Handedness: Left   Mood and Affect  Mood: Anxious  Affect: Congruent   Thought Process  Thought Processes: Goal Directed  Descriptions of Associations:Circumstantial  Orientation:Partial  Thought Content:Scattered  History of Schizophrenia/Schizoaffective disorder:Yes  Duration of Psychotic Symptoms:Greater than six months  Hallucinations:No data recorded Ideas of Reference:None  Suicidal Thoughts:No data recorded Homicidal Thoughts:No data recorded  Sensorium  Memory: Remote Good  Judgment: Fair  Insight: Fair   Art therapist  Concentration: Fair  Attention Span: Fair  Recall: [employer] of Knowledge: Fair  Language: Fair   Psychomotor Activity  Psychomotor Activity:No data recorded  Assets  Assets:No data recorded  Sleep  Sleep:No data recorded   Physical Exam: Physical Exam Vitals and nursing note reviewed.  Constitutional:      Appearance: Normal appearance.  HENT:     Head: Normocephalic and atraumatic.     Mouth/Throat:     Pharynx: Oropharynx is clear.  Eyes:     Pupils: Pupils are equal, round, and reactive to light.  Cardiovascular:     Rate and Rhythm: Normal rate and regular rhythm.  Pulmonary:     Effort: Pulmonary effort is normal.     Breath sounds: Normal breath sounds.  Abdominal:     General: Abdomen is flat.     Palpations: Abdomen is soft.  Musculoskeletal:        General: Normal range of motion.  Skin:    General: Skin is warm and dry.  Neurological:     General: No focal deficit present.     Mental Status: He is alert. Mental status is at baseline.  Psychiatric:        Attention and Perception: He is inattentive.        Mood and Affect: Mood normal. Affect is blunt.         Speech: He is noncommunicative.    ROS Blood pressure 114/78, pulse 96, temperature 98.1 F (36.7 C), temperature source Oral, resp. rate 20, height 5\' 8"  (1.727 m), weight 85.8 kg, SpO2 99 %. Body mass index is 28.76 kg/m.   Treatment Plan Summary: Medication management and Plan no change to medication.  Continue to look for placement.  , MD 11/29/2022, 4:48 PM

## 2022-11-29 NOTE — Group Note (Signed)
[facility] LCSW Group Therapy Note    Group Date: 11/29/2022 Start Time: 1300 End Time: 1400  Type of Therapy and Topic:  Group Therapy:  Overcoming Obstacles  Participation Level:  [facility] PARTICIPATION LEVEL: Did Not Attend  Mood:  Description of Group:   In this group patients will be encouraged to explore what they see as obstacles to their own wellness and recovery. They will be guided to discuss their thoughts, feelings, and behaviors related to these obstacles. The group will process together ways to cope with barriers, with attention given to specific choices patients can make. Each patient will be challenged to identify changes they are motivated to make in order to overcome their obstacles. This group will be process-oriented, with patients participating in exploration of their own experiences as well as giving and receiving support and challenge from other group members.  Therapeutic Goals: 1. Patient will identify personal and current obstacles as they relate to admission. 2. Patient will identify barriers that currently interfere with their wellness or overcoming obstacles.  3. Patient will identify feelings, thought process and behaviors related to these barriers. 4. Patient will identify two changes they are willing to make to overcome these obstacles:    Summary of Patient Progress   Patient declined to attend group despite being encouraged to do so by this clinician.    Therapeutic Modalities:   Cognitive Behavioral Therapy Solution Focused Therapy Motivational Interviewing Relapse Prevention Therapy   Verlean Allport J Atara Paterson, LCSW 

## 2022-11-30 NOTE — Progress Notes (Signed)
Overall having a good day. Watching the [employer] team play and interacting well with his peers.  Some agitation  this evening when his mother did not answer his phone calls. Was happy with staff leaving her a message.  Denies having any active psychiatric symptoms. Nods and says yes to sleeping and eating well. Took his meds without issue. Q 15 minute safety checks in place.   C Butler-Nicholson, LPN

## 2022-11-30 NOTE — Progress Notes (Signed)
[facility] MD Progress Note  11/30/2022 1:45 PM Adam [name]  MRN:  [id number] Subjective: Follow-up patient with schizophrenia.  No new complaints.  No behavior problems of any significance or different than usual.  Makes loud noises at times but not with any hostility and usually very redirectable. 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)   Stroke (HCC)   HTN (hypertension)   HLD (hyperlipidemia)   CAD (coronary artery disease)   Chronic systolic CHF (congestive heart failure) (HCC)   Anxiety   Infection of left earlobe  Total Time spent with patient: 30 minutes  Past Psychiatric History: Past history of schizophrenia and stroke  Past Medical History:  Past Medical History:  Diagnosis Date   Myocardial infarction (HCC)    Schizophrenia (HCC)    Stroke (HCC)     Past Surgical History:  Procedure Laterality Date   NO PAST SURGERIES     Family History:  Family History  Problem Relation Age of Onset   Hypertension Mother    Family Psychiatric  History: See previous Social History:  Social History   Substance and Sexual Activity  Alcohol Use Not Currently     Social History   Substance and Sexual Activity  Drug Use Not Currently    Social History   Socioeconomic History   Marital status: Single    Spouse name: Not on file   Number of children: Not on file   Years of education: Not on file   Highest education level: Not on file  Occupational History   Not on file  Tobacco Use   Smoking status: Never    Passive exposure: Never   Smokeless tobacco: Never  Vaping Use   Vaping Use: Unknown  Substance and Sexual Activity   Alcohol use: Not Currently   Drug use: Not Currently   Sexual activity: Not Currently  Other Topics Concern   Not on  file  Social History Narrative   Not on file   Social Determinants of Health   Financial Resource Strain: Not on file  Food Insecurity: Not on file  Transportation Needs: Not on file  Physical Activity: Not on file  Stress: Not on file  Social Connections: Not on file   Additional Social History:  Specify valuables returned:  (none)                      Sleep: Fair  Appetite:  Fair  Current Medications: Current Facility-Administered Medications  Medication Dose Route Frequency Provider Last Rate Last Admin   acetaminophen (TYLENOL) tablet 650 mg  650 mg Oral Q6H PRN Tishia Maestre T, MD   650 mg at 11/15/22 1239   alum & mag hydroxide-simeth (MAALOX/MYLANTA) 200-200-20 MG/5ML suspension 30 mL  30 mL Oral Q4H PRN Aldean Pipe T, MD   30 mL at 04/26/22 3716   aspirin EC tablet 81 mg  81 mg Oral Daily Jullie Arps, [name], MD   81 mg at 11/30/22 9678   atorvastatin (LIPITOR) tablet 20 mg  20 mg Oral Daily Kamilo Och T, MD   20 mg at 11/30/22 0938   atropine 1 % ophthalmic solution 1 drop  1 drop Sublingual QID [name], MD   1 drop at 11/30/22 1118   benztropine (COGENTIN) tablet 0.5 mg  0.5 mg Oral BID Ryver Poblete, [name], MD  0.5 mg at 11/30/22 [id number]   clonazePAM (KLONOPIN) tablet 1 mg  1 mg Oral TID PRN He, [name], MD   1 mg at 11/25/22 0602   cloZAPine (CLOZARIL) tablet 300 mg  300 mg Oral QHS Rehaan Viloria T, MD   300 mg at 11/29/22 2134   diphenhydrAMINE (BENADRYL) capsule 50 mg  50 mg Oral Q6H PRN Sary Bogie T, MD   50 mg at 11/29/22 1005   Or   diphenhydrAMINE (BENADRYL) injection 50 mg  50 mg Intramuscular Q6H PRN March Joos T, MD       feeding supplement (ENSURE ENLIVE / ENSURE PLUS) liquid 237 mL  237 mL Oral BID BM Ronneisha Jett T, MD   237 mL at 11/30/22 0944   gabapentin (NEURONTIN) capsule 300 mg  300 mg Oral TID Viki Carrera T, MD   300 mg at 11/30/22 [id number]   haloperidol (HALDOL) tablet 2 mg  2 mg Oral TID Jone Panebianco, [name], MD   2 mg at 11/30/22 [id number]    haloperidol (HALDOL) tablet 5 mg  5 mg Oral Q6H PRN Paytan Recine T, MD   5 mg at 11/21/22 2132   Or   haloperidol lactate (HALDOL) injection 5 mg  5 mg Intramuscular Q6H PRN Madesyn Ast T, MD       lisinopril (ZESTRIL) tablet 5 mg  5 mg Oral Daily Jaleyah Longhi T, MD   5 mg at 11/29/22 1006   magnesium hydroxide (MILK OF MAGNESIA) suspension 30 mL  30 mL Oral Daily PRN Micky Sheller T, MD   30 mL at 11/03/22 0803   metoprolol succinate (TOPROL-XL) 24 hr tablet 25 mg  25 mg Oral Daily Charlee Whitebread T, MD   25 mg at 11/29/22 1006   ondansetron (ZOFRAN) tablet 4 mg  4 mg Oral Q8H PRN Deloria Lair, NP   4 mg at 09/06/22 2320   paliperidone (INVEGA SUSTENNA) injection 156 mg  156 mg Intramuscular Q28 days Duwan Adrian T, MD   156 mg at 11/20/22 1741   paliperidone (INVEGA) 24 hr tablet 6 mg  6 mg Oral QHS Drew Herman T, MD   6 mg at 11/29/22 2134   senna-docusate (Senokot-S) tablet 2 tablet  2 tablet Oral QHS PRN [name], DO   2 tablet at 09/15/22 2110   temazepam (RESTORIL) capsule 15 mg  15 mg Oral QHS Maggy Wyble T, MD   15 mg at 11/29/22 2135   ziprasidone (GEODON) injection 20 mg  20 mg Intramuscular Q12H PRN Emmett Bracknell, [name], MD   20 mg at 07/08/22 1453    Lab Results:  Results for orders placed or performed during the hospital encounter of 03/19/22 (from the past 48 hour(s))  Resp panel by RT-PCR (RSV, Flu A&B, Covid) Anterior Nasal Swab     Status: None   Collection Time: 11/29/22 11:12 AM   Specimen: Anterior Nasal Swab  Result Value Ref Range   SARS Coronavirus 2 by RT PCR NEGATIVE NEGATIVE    Comment: (NOTE) SARS-CoV-2 target nucleic acids are NOT DETECTED.  The SARS-CoV-2 RNA is generally detectable in upper respiratory specimens during the acute phase of infection. The lowest concentration of SARS-CoV-2 viral copies this assay can detect is 138 copies/mL. A negative result does not preclude SARS-Cov-2 infection and should not be used as the sole basis  for treatment or other patient management decisions. A negative result may occur with  improper specimen collection/handling, submission of specimen other than nasopharyngeal swab, presence  of viral mutation(s) within the areas targeted by this assay, and inadequate number of viral copies(<138 copies/mL). A negative result must be combined with clinical observations, patient history, and epidemiological information. The expected result is Negative.  Fact Sheet for Patients:  BloggerCourse.com  Fact Sheet for Healthcare Providers:  SeriousBroker.it  This test is no t yet approved or cleared by the [place] FDA and  has been authorized for detection and/or diagnosis of SARS-CoV-2 by FDA under an Emergency Use Authorization (EUA). This EUA will remain  in effect (meaning this test can be used) for the duration of the COVID-19 declaration under Section 564(b)(1) of the Act, 21 U.S.C.section 360bbb-3(b)(1), unless the authorization is terminated  or revoked sooner.       Influenza A by PCR NEGATIVE NEGATIVE   Influenza B by PCR NEGATIVE NEGATIVE    Comment: (NOTE) The Xpert Xpress SARS-CoV-2/FLU/RSV plus assay is intended as an aid in the diagnosis of influenza from Nasopharyngeal swab specimens and should not be used as a sole basis for treatment. Nasal washings and aspirates are unacceptable for Xpert Xpress SARS-CoV-2/FLU/RSV testing.  Fact Sheet for Patients: BloggerCourse.com  Fact Sheet for Healthcare Providers: SeriousBroker.it  This test is not yet approved or cleared by the [place] FDA and has been authorized for detection and/or diagnosis of SARS-CoV-2 by FDA under an Emergency Use Authorization (EUA). This EUA will remain in effect (meaning this test can be used) for the duration of the COVID-19 declaration under Section 564(b)(1) of the Act, 21  U.S.C. section 360bbb-3(b)(1), unless the authorization is terminated or revoked.     Resp Syncytial Virus by PCR NEGATIVE NEGATIVE    Comment: (NOTE) Fact Sheet for Patients: BloggerCourse.com  Fact Sheet for Healthcare Providers: SeriousBroker.it  This test is not yet approved or cleared by the [place] FDA and has been authorized for detection and/or diagnosis of SARS-CoV-2 by FDA under an Emergency Use Authorization (EUA). This EUA will remain in effect (meaning this test can be used) for the duration of the COVID-19 declaration under Section 564(b)(1) of the Act, 21 U.S.C. section 360bbb-3(b)(1), unless the authorization is terminated or revoked.  Performed at [facility], [address] [place], [place] [address]     Blood Alcohol level:  Lab Results  Component Value Date   [facility] <10 07/20/2021    Metabolic Disorder Labs: Lab Results  Component Value Date   HGBA1C 5.5 04/10/2022   MPG 111.15 04/10/2022   No results found for: "PROLACTIN" Lab Results  Component Value Date   CHOL 167 11/20/2021   TRIG 107 11/20/2021   HDL 26 (L) 11/20/2021   CHOLHDL 6.4 11/20/2021   VLDL 21 11/20/2021   LDLCALC 120 (H) 11/20/2021    Physical Findings: AIMS: Facial and Oral Movements Muscles of Facial Expression: None, normal Lips and Perioral Area: None, normal Jaw: None, normal Tongue: None, normal,Extremity Movements Upper (arms, wrists, hands, fingers): None, normal Lower (legs, knees, ankles, toes): None, normal, Trunk Movements Neck, shoulders, hips: None, normal, Overall Severity Severity of abnormal movements (highest score from questions above): None, normal Incapacitation due to abnormal movements: None, normal Patient's awareness of abnormal movements (rate only patient's report): No Awareness, Dental Status Current problems with teeth and/or dentures?: No Does patient usually wear dentures?: No   CIWA:    COWS:     Musculoskeletal: Strength & Muscle Tone: within normal limits Gait & Station: normal Patient leans: N/A  Psychiatric Specialty Exam:  Presentation  General Appearance:  Appropriate  for Environment; Casual; Neat; Well Groomed  Eye Contact: Fair  Speech: Slow  Speech Volume: Decreased  Handedness: Left   Mood and Affect  Mood: Anxious  Affect: Congruent   Thought Process  Thought Processes: Goal Directed  Descriptions of Associations:Circumstantial  Orientation:Partial  Thought Content:Scattered  History of Schizophrenia/Schizoaffective disorder:Yes  Duration of Psychotic Symptoms:Greater than six months  Hallucinations:No data recorded Ideas of Reference:None  Suicidal Thoughts:No data recorded Homicidal Thoughts:No data recorded  Sensorium  Memory: Remote Good  Judgment: Fair  Insight: Fair   Community education officer  Concentration: Fair  Attention Span: Fair  Recall: [employer] of Knowledge: Fair  Language: Fair   Psychomotor Activity  Psychomotor Activity:No data recorded  Assets  Assets:No data recorded  Sleep  Sleep:No data recorded   Physical Exam: Physical Exam Vitals and nursing note reviewed.  Constitutional:      Appearance: Normal appearance.  HENT:     Head: Normocephalic and atraumatic.     Mouth/Throat:     Pharynx: Oropharynx is clear.  Eyes:     Pupils: Pupils are equal, round, and reactive to light.  Cardiovascular:     Rate and Rhythm: Normal rate and regular rhythm.  Pulmonary:     Effort: Pulmonary effort is normal.     Breath sounds: Normal breath sounds.  Abdominal:     General: Abdomen is flat.     Palpations: Abdomen is soft.  Musculoskeletal:        General: Normal range of motion.  Skin:    General: Skin is warm and dry.  Neurological:     General: No focal deficit present.     Mental Status: He is alert. Mental status is at baseline.  Psychiatric:         Attention and Perception: He is inattentive.        Mood and Affect: Mood normal. Affect is blunt.        Speech: He is noncommunicative.    Review of Systems  Unable to perform ROS: Language   Blood pressure 92/67, pulse 94, temperature 98 F (36.7 C), temperature source Oral, resp. rate 20, height 5\' 8"  (1.727 m), weight 85.8 kg, SpO2 100 %. Body mass index is 28.76 kg/m.   Treatment Plan Summary: Medication management and Plan no change to medication management.  Continue current plan focused entirely at this point on discharge.  Alethia Berthold, MD 11/30/2022, 1:45 PM

## 2022-11-30 NOTE — Progress Notes (Signed)
Pt denies SI/HI/AVH and verbally agrees to approach staff if these become apparent or before harming themselves/others. Rates depression 0/10. Rates anxiety 0/10. Rates pain 0/10.  Scheduled medications administered to pt, per MD orders. RN provided support and encouragement to pt. Q15 min safety checks implemented and continued. Pt safe on the unit. RN will continue to monitor and intervene as needed.  11/30/22 0941  Psych Admission Type (Psych Patients Only)  Admission Status Involuntary  Psychosocial Assessment  Patient Complaints None  Eye Contact Fair  Facial Expression Animated  Affect Labile  Speech Aphasic  Interaction Assertive;Childlike  Motor Activity Slow  Appearance/Hygiene Unremarkable  Behavior Characteristics Cooperative;Appropriate to situation;Calm  Mood Labile  Thought Process  Coherency Circumstantial  Content WDL  Delusions None reported or observed  Perception WDL  Hallucination None reported or observed  Judgment Impaired  Confusion None  Danger to Self  Current suicidal ideation? Denies  Danger to Others  Danger to Others None reported or observed  Danger to Others Abnormal  Harmful Behavior to others No threats or harm toward other people  Destructive Behavior No threats or harm toward property

## 2022-11-30 NOTE — Plan of Care (Signed)
  Problem: Self-Concept: Goal: Level of anxiety will decrease Outcome: Progressing   Problem: Education: Goal: Knowledge of General Education information will improve Description: Including pain rating scale, medication(s)/side effects and non-pharmacologic comfort measures Outcome: Progressing

## 2022-11-30 NOTE — Group Note (Signed)
LCSW Group Therapy Note  Group Date: 11/30/2022 Start Time: 1300 End Time: 1400   Type of Therapy and Topic:  Group Therapy - Healthy vs Unhealthy Coping Skills  Participation Level:  Did Not Attend   Description of Group The focus of this group was to determine what unhealthy coping techniques typically are used by group members and what healthy coping techniques would be helpful in coping with various problems. Patients were guided in becoming aware of the differences between healthy and unhealthy coping techniques. Patients were asked to identify 2-3 healthy coping skills they would like to learn to use more effectively.  Therapeutic Goals Patients learned that coping is what human beings do all day long to deal with various situations in their lives Patients defined and discussed healthy vs unhealthy coping techniques Patients identified their preferred coping techniques and identified whether these were healthy or unhealthy Patients determined 2-3 healthy coping skills they would like to become more familiar with and use more often. Patients provided support and ideas to each other   Summary of Patient Progress:  Pt did not attend group despite invitation.    Therapeutic Modalities Cognitive Behavioral Therapy Motivational Interviewing  Lyndel Dancel R Jesus Poplin, LCSWA 11/30/2022  3:29 PM   

## 2022-11-30 NOTE — Progress Notes (Signed)
Recreation Therapy Notes   Date: 11/30/2022  Time: 10:20 am  Location: Craft room     Behavioral response: N/A   Intervention Topic: Stress Management   Discussion/Intervention: Patient refused to attend group.   Clinical Observations/Feedback:  Patient refused to attend group.    Fox Salminen LRT/CTRS        Oland Arquette 11/30/2022 12:11 PM

## 2022-12-01 NOTE — Plan of Care (Signed)
D: Patient alert and oriented. Patient denies pain. Patient denies anxiety and depression. Patient denies SI/HI/AVH. Patient began the shift yelling for mom asking staff for staff to call his mom. Patient has spent the rest of the shift watching television in the dayroom.  A: Scheduled medications administered to patient, per MD orders.  Support and encouragement provided to patient.  Q15 minute safety checks maintained.   R: Patient compliant with medication administration and treatment plan. No adverse drug reactions noted. Patient remains safe on the unit at this time. Problem: Education: Goal: Knowledge of General Education information will improve Description: Including pain rating scale, medication(s)/side effects and non-pharmacologic comfort measures Outcome: Progressing   Problem: Clinical Measurements: Goal: Ability to maintain clinical measurements within normal limits will improve Outcome: Progressing   Problem: Education: Goal: Will be free of psychotic symptoms Outcome: Progressing

## 2022-12-01 NOTE — Progress Notes (Signed)
[facility] MD Progress Note  12/01/2022 10:41 AM Adam [name]  MRN:  [id number] Subjective: Adam [name] is seen on rounds.  No changes.  No issues.  No complaints. 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)   Stroke (HCC)   HTN (hypertension)   HLD (hyperlipidemia)   CAD (coronary artery disease)   Chronic systolic CHF (congestive heart failure) (HCC)   Anxiety   Infection of left earlobe  Total Time spent with patient: 15 minutes  Past Psychiatric History: CVA and schizophrenia  Past Medical History:  Past Medical History:  Diagnosis Date   Myocardial infarction (HCC)    Schizophrenia (HCC)    Stroke (HCC)     Past Surgical History:  Procedure Laterality Date   NO PAST SURGERIES     Family History:  Family History  Problem Relation Age of Onset   Hypertension Mother    Family Psychiatric  History: Unremarkable Social History:  Social History   Substance and Sexual Activity  Alcohol Use Not Currently     Social History   Substance and Sexual Activity  Drug Use Not Currently    Social History   Socioeconomic History   Marital status: Single    Spouse name: Not on file   Number of children: Not on file   Years of education: Not on file   Highest education level: Not on file  Occupational History   Not on file  Tobacco Use   Smoking status: Never    Passive exposure: Never   Smokeless tobacco: Never  Vaping Use   Vaping Use: Unknown  Substance and Sexual Activity   Alcohol use: Not Currently   Drug use: Not Currently   Sexual activity: Not Currently  Other Topics Concern   Not on file  Social History Narrative   Not on file   Social Determinants of Health   Financial Resource Strain: Not on file  Food Insecurity: Not on file   Transportation Needs: Not on file  Physical Activity: Not on file  Stress: Not on file  Social Connections: Not on file   Additional Social History:  Specify valuables returned:  (none)                      Sleep: Good  Appetite:  Good  Current Medications: Current Facility-Administered Medications  Medication Dose Route Frequency Provider Last Rate Last Admin   acetaminophen (TYLENOL) tablet 650 mg  650 mg Oral Q6H PRN [name], [name], MD   650 mg at 11/15/22 1239   alum & mag hydroxide-simeth (MAALOX/MYLANTA) 200-200-20 MG/5ML suspension 30 mL  30 mL Oral Q4H PRN [name], [name], MD   30 mL at 04/26/22 6644   aspirin EC tablet 81 mg  81 mg Oral Daily [name], [name], MD   81 mg at 12/01/22 0853   atorvastatin (LIPITOR) tablet 20 mg  20 mg Oral Daily [name], [name], MD   20 mg at 12/01/22 0853   atropine 1 % ophthalmic solution 1 drop  1 drop Sublingual QID [name], MD   1 drop at 12/01/22 0854   benztropine (COGENTIN) tablet 0.5 mg  0.5 mg Oral BID [name], [name], MD   0.5 mg at 12/01/22 0853   clonazePAM (KLONOPIN) tablet 1 mg  1 mg Oral TID PRN He, [name], MD   1  mg at 11/25/22 0602   cloZAPine (CLOZARIL) tablet 300 mg  300 mg Oral QHS [name], [name], MD   300 mg at 11/30/22 2143   diphenhydrAMINE (BENADRYL) capsule 50 mg  50 mg Oral Q6H PRN [name], [name], MD   50 mg at 11/29/22 1005   Or   diphenhydrAMINE (BENADRYL) injection 50 mg  50 mg Intramuscular Q6H PRN [name], [name], MD       feeding supplement (ENSURE ENLIVE / ENSURE PLUS) liquid 237 mL  237 mL Oral BID BM [name], [name], MD   237 mL at 11/30/22 1404   gabapentin (NEURONTIN) capsule 300 mg  300 mg Oral TID [name], [name], MD   300 mg at 12/01/22 0900   haloperidol (HALDOL) tablet 2 mg  2 mg Oral TID [name], [name], MD   2 mg at 12/01/22 0900   haloperidol (HALDOL) tablet 5 mg  5 mg Oral Q6H PRN [name], [name], MD   5 mg at 11/21/22 2132   Or   haloperidol lactate (HALDOL) injection 5 mg  5 mg  Intramuscular Q6H PRN [name], [name], MD       lisinopril (ZESTRIL) tablet 5 mg  5 mg Oral Daily [name], [name], MD   5 mg at 12/01/22 [id number]   magnesium hydroxide (MILK OF MAGNESIA) suspension 30 mL  30 mL Oral Daily PRN [name], [name], MD   30 mL at 11/03/22 0803   metoprolol succinate (TOPROL-XL) 24 hr tablet 25 mg  25 mg Oral Daily [name], [name], MD   25 mg at 12/01/22 0853   ondansetron (ZOFRAN) tablet 4 mg  4 mg Oral Q8H PRN Deloria Lair, NP   4 mg at 09/06/22 2320   paliperidone (INVEGA SUSTENNA) injection 156 mg  156 mg Intramuscular Q28 days [name], [name], MD   156 mg at 11/20/22 1741   paliperidone (INVEGA) 24 hr tablet 6 mg  6 mg Oral QHS [name], [name], MD   6 mg at 11/30/22 2141   senna-docusate (Senokot-S) tablet 2 tablet  2 tablet Oral QHS PRN [name], DO   2 tablet at 09/15/22 2110   temazepam (RESTORIL) capsule 15 mg  15 mg Oral QHS [name], [name], MD   15 mg at 11/30/22 2143   ziprasidone (GEODON) injection 20 mg  20 mg Intramuscular Q12H PRN [name], [name], MD   20 mg at 07/08/22 1453    Lab Results:  Results for orders placed or performed during the hospital encounter of 03/19/22 (from the past 48 hour(s))  Resp panel by RT-PCR (RSV, Flu A&B, Covid) Anterior Nasal Swab     Status: None   Collection Time: 11/29/22 11:12 AM   Specimen: Anterior Nasal Swab  Result Value Ref Range   SARS Coronavirus 2 by RT PCR NEGATIVE NEGATIVE    Comment: (NOTE) SARS-CoV-2 target nucleic acids are NOT DETECTED.  The SARS-CoV-2 RNA is generally detectable in upper respiratory specimens during the acute phase of infection. The lowest concentration of SARS-CoV-2 viral copies this assay can detect is 138 copies/mL. A negative result does not preclude SARS-Cov-2 infection and should not be used as the sole basis for treatment or other patient management decisions. A negative result may occur with  improper specimen collection/handling, submission of specimen  other than nasopharyngeal swab, presence of viral mutation(s) within the areas targeted by this assay, and inadequate number of viral copies(<138 copies/mL). A negative result must be combined with  clinical observations, patient history, and epidemiological information. The expected result is Negative.  Fact Sheet for Patients:  EntrepreneurPulse.com.au  Fact Sheet for Healthcare Providers:  IncredibleEmployment.be  This test is no t yet approved or cleared by the [place] FDA and  has been authorized for detection and/or diagnosis of SARS-CoV-2 by FDA under an Emergency Use Authorization (EUA). This EUA will remain  in effect (meaning this test can be used) for the duration of the COVID-19 declaration under Section 564(b)(1) of the Act, 21 U.S.C.section 360bbb-3(b)(1), unless the authorization is terminated  or revoked sooner.       Influenza A by PCR NEGATIVE NEGATIVE   Influenza B by PCR NEGATIVE NEGATIVE    Comment: (NOTE) The Xpert Xpress SARS-CoV-2/FLU/RSV plus assay is intended as an aid in the diagnosis of influenza from Nasopharyngeal swab specimens and should not be used as a sole basis for treatment. Nasal washings and aspirates are unacceptable for Xpert Xpress SARS-CoV-2/FLU/RSV testing.  Fact Sheet for Patients: EntrepreneurPulse.com.au  Fact Sheet for Healthcare Providers: IncredibleEmployment.be  This test is not yet approved or cleared by the [place] FDA and has been authorized for detection and/or diagnosis of SARS-CoV-2 by FDA under an Emergency Use Authorization (EUA). This EUA will remain in effect (meaning this test can be used) for the duration of the COVID-19 declaration under Section 564(b)(1) of the Act, 21 U.S.C. section 360bbb-3(b)(1), unless the authorization is terminated or revoked.     Resp Syncytial Virus by PCR NEGATIVE NEGATIVE    Comment: (NOTE) Fact  Sheet for Patients: EntrepreneurPulse.com.au  Fact Sheet for Healthcare Providers: IncredibleEmployment.be  This test is not yet approved or cleared by the [place] FDA and has been authorized for detection and/or diagnosis of SARS-CoV-2 by FDA under an Emergency Use Authorization (EUA). This EUA will remain in effect (meaning this test can be used) for the duration of the COVID-19 declaration under Section 564(b)(1) of the Act, 21 U.S.C. section 360bbb-3(b)(1), unless the authorization is terminated or revoked.  Performed at [facility], [address] [place], [place] [address]     Blood Alcohol level:  Lab Results  Component Value Date   [facility] <10 [id number]    Metabolic Disorder Labs: Lab Results  Component Value Date   HGBA1C 5.5 04/10/2022   MPG 111.15 04/10/2022   No results found for: "PROLACTIN" Lab Results  Component Value Date   CHOL 167 11/20/2021   TRIG 107 11/20/2021   HDL 26 (L) 11/20/2021   CHOLHDL 6.4 11/20/2021   VLDL 21 11/20/2021   LDLCALC 120 (H) 11/20/2021    Physical Findings: AIMS: Facial and Oral Movements Muscles of Facial Expression: None, normal Lips and Perioral Area: None, normal Jaw: None, normal Tongue: None, normal,Extremity Movements Upper (arms, wrists, hands, fingers): None, normal Lower (legs, knees, ankles, toes): None, normal, Trunk Movements Neck, shoulders, hips: None, normal, Overall Severity Severity of abnormal movements (highest score from questions above): None, normal Incapacitation due to abnormal movements: None, normal Patient's awareness of abnormal movements (rate only patient's report): No Awareness, Dental Status Current problems with teeth and/or dentures?: No Does patient usually wear dentures?: No  CIWA:    COWS:     Musculoskeletal: Strength & Muscle Tone: within normal limits Gait & Station: normal Patient leans: N/A  Psychiatric Specialty  Exam:  Presentation  General Appearance:  Appropriate for Environment; Casual; Neat; Well Groomed  Eye Contact: Fair  Speech: Slow  Speech Volume: Decreased  Handedness: Left   Mood and  Affect  Mood: Anxious  Affect: Congruent   Thought Process  Thought Processes: Goal Directed  Descriptions of Associations:Circumstantial  Orientation:Partial  Thought Content:Scattered  History of Schizophrenia/Schizoaffective disorder:Yes  Duration of Psychotic Symptoms:Greater than six months  Hallucinations:No data recorded Ideas of Reference:None  Suicidal Thoughts:No data recorded Homicidal Thoughts:No data recorded  Sensorium  Memory: Remote Good  Judgment: Fair  Insight: Fair   Community education officer  Concentration: Fair  Attention Span: Fair  Recall: [employer] of Knowledge: Fair  Language: Fair   Psychomotor Activity  Psychomotor Activity:No data recorded  Assets  Assets:No data recorded  Sleep  Sleep:No data recorded   Physical Exam: Physical Exam: Mood and affect are stable. ROS: Negative Blood pressure (!) 85/71, pulse (!) 108, temperature 98 F (36.7 C), temperature source Oral, resp. rate 18, height 5\' 8"  (1.727 m), weight 85.8 kg, SpO2 100 %. Body mass index is 28.76 kg/m.   Treatment Plan Summary: Daily contact with patient to assess and evaluate symptoms and progress in treatment, Medication management, and Plan continue current medications.  [name], DO 12/01/2022, 10:41 AM

## 2022-12-01 NOTE — Progress Notes (Signed)
This Probation officer held patient's BP medication: Metoprolol and Lisinopril being that his BP was 85/71, order parameters were not met. MD will be notified. Patient was given a Gatorade and encouraged to increase his fluid intake.

## 2022-12-01 NOTE — Progress Notes (Signed)
Patient is alert and oriented x 2 with periods of confusion to situation and time, he was frequently redirected. Patient's affect is blunted, he was irritable and angry and refused to take medication initially until later in the shift he requested for the medication. Patient appears restless on this shift and is not interacting appropriately with peers and staff.

## 2022-12-02 LAB — CBC WITH DIFFERENTIAL/PLATELET
Abs Immature Granulocytes: 0.01 10*3/uL (ref 0.00–0.07)
Basophils Absolute: 0.1 10*3/uL (ref 0.0–0.1)
Basophils Relative: 2 %
Eosinophils Absolute: 0.1 10*3/uL (ref 0.0–0.5)
Eosinophils Relative: 1 %
HCT: 39.2 % (ref 39.0–52.0)
Hemoglobin: 13.6 g/dL (ref 13.0–17.0)
Immature Granulocytes: 0 %
Lymphocytes Relative: 22 %
Lymphs Abs: 1.1 10*3/uL (ref 0.7–4.0)
MCH: 30.7 pg (ref 26.0–34.0)
MCHC: 34.7 g/dL (ref 30.0–36.0)
MCV: 88.5 fL (ref 80.0–100.0)
Monocytes Absolute: 0.7 10*3/uL (ref 0.1–1.0)
Monocytes Relative: 13 %
Neutro Abs: 3.2 10*3/uL (ref 1.7–7.7)
Neutrophils Relative %: 62 %
Platelets: 205 10*3/uL (ref 150–400)
RBC: 4.43 MIL/uL (ref 4.22–5.81)
RDW: 12.2 % (ref 11.5–15.5)
WBC: 5.1 10*3/uL (ref 4.0–10.5)
nRBC: 0 % (ref 0.0–0.2)

## 2022-12-02 NOTE — Progress Notes (Signed)
[facility] MD Progress Note  12/02/2022 10:41 AM Adam [name]  MRN:  [id number] Subjective: Adam [name] was seen on rounds.  No complaints.  No issues.  No changes. 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)   Stroke (HCC)   HTN (hypertension)   HLD (hyperlipidemia)   CAD (coronary artery disease)   Chronic systolic CHF (congestive heart failure) (HCC)   Anxiety   Infection of left earlobe  Total Time spent with patient: 15 minutes  Past Psychiatric History: CVA and schizophrenia  Past Medical History:  Past Medical History:  Diagnosis Date   Myocardial infarction (HCC)    Schizophrenia (HCC)    Stroke (HCC)     Past Surgical History:  Procedure Laterality Date   NO PAST SURGERIES     Family History:  Family History  Problem Relation Age of Onset   Hypertension Mother    Family Psychiatric  History: Unremarkable Social History:  Social History   Substance and Sexual Activity  Alcohol Use Not Currently     Social History   Substance and Sexual Activity  Drug Use Not Currently    Social History   Socioeconomic History   Marital status: Single    Spouse name: Not on file   Number of children: Not on file   Years of education: Not on file   Highest education level: Not on file  Occupational History   Not on file  Tobacco Use   Smoking status: Never    Passive exposure: Never   Smokeless tobacco: Never  Vaping Use   Vaping Use: Unknown  Substance and Sexual Activity   Alcohol use: Not Currently   Drug use: Not Currently   Sexual activity: Not Currently  Other Topics Concern   Not on file  Social History Narrative   Not on file   Social Determinants of Health   Financial Resource Strain: Not on file  Food Insecurity: Not on file   Transportation Needs: Not on file  Physical Activity: Not on file  Stress: Not on file  Social Connections: Not on file   Additional Social History:  Specify valuables returned:  (none)                      Sleep: Good  Appetite:  Good  Current Medications: Current Facility-Administered Medications  Medication Dose Route Frequency Provider Last Rate Last Admin   acetaminophen (TYLENOL) tablet 650 mg  650 mg Oral Q6H PRN [name], [name], MD   650 mg at 11/15/22 1239   alum & mag hydroxide-simeth (MAALOX/MYLANTA) 200-200-20 MG/5ML suspension 30 mL  30 mL Oral Q4H PRN [name], [name], MD   30 mL at 04/26/22 9675   aspirin EC tablet 81 mg  81 mg Oral Daily [name], [name], MD   81 mg at 12/02/22 1013   atorvastatin (LIPITOR) tablet 20 mg  20 mg Oral Daily [name], [name], MD   20 mg at 12/02/22 1013   atropine 1 % ophthalmic solution 1 drop  1 drop Sublingual QID [name], MD   1 drop at 12/02/22 1016   benztropine (COGENTIN) tablet 0.5 mg  0.5 mg Oral BID [name], [name], MD   0.5 mg at 12/02/22 1013   clonazePAM (KLONOPIN) tablet 1 mg  1 mg Oral TID PRN He, [name], MD   1  mg at 11/25/22 0602   cloZAPine (CLOZARIL) tablet 300 mg  300 mg Oral QHS [name], [name], MD   300 mg at 12/01/22 2122   diphenhydrAMINE (BENADRYL) capsule 50 mg  50 mg Oral Q6H PRN [name], [name], MD   50 mg at 11/29/22 1005   Or   diphenhydrAMINE (BENADRYL) injection 50 mg  50 mg Intramuscular Q6H PRN [name], [name], MD       feeding supplement (ENSURE ENLIVE / ENSURE PLUS) liquid 237 mL  237 mL Oral BID BM [name], [name], MD   237 mL at 12/02/22 1014   gabapentin (NEURONTIN) capsule 300 mg  300 mg Oral TID [name], [name], MD   300 mg at 12/02/22 1014   haloperidol (HALDOL) tablet 2 mg  2 mg Oral TID [name], [name], MD   2 mg at 12/02/22 1013   haloperidol (HALDOL) tablet 5 mg  5 mg Oral Q6H PRN [name], [name], MD   5 mg at 11/21/22 2132   Or   haloperidol lactate (HALDOL) injection 5 mg  5 mg  Intramuscular Q6H PRN [name], [name], MD       lisinopril (ZESTRIL) tablet 5 mg  5 mg Oral Daily [name], [name], MD   5 mg at 12/01/22 4098   magnesium hydroxide (MILK OF MAGNESIA) suspension 30 mL  30 mL Oral Daily PRN [name], [name], MD   30 mL at 11/03/22 0803   metoprolol succinate (TOPROL-XL) 24 hr tablet 25 mg  25 mg Oral Daily [name], [name], MD   25 mg at 12/01/22 0853   ondansetron (ZOFRAN) tablet 4 mg  4 mg Oral Q8H PRN Jearld Lesch, NP   4 mg at 09/06/22 2320   paliperidone (INVEGA SUSTENNA) injection 156 mg  156 mg Intramuscular Q28 days [name], [name], MD   156 mg at 11/20/22 1741   paliperidone (INVEGA) 24 hr tablet 6 mg  6 mg Oral QHS [name], [name], MD   6 mg at 12/01/22 2122   senna-docusate (Senokot-S) tablet 2 tablet  2 tablet Oral QHS PRN Sarina Ill, DO   2 tablet at 09/15/22 2110   temazepam (RESTORIL) capsule 15 mg  15 mg Oral QHS [name], [name], MD   15 mg at 12/01/22 2122   ziprasidone (GEODON) injection 20 mg  20 mg Intramuscular Q12H PRN [name], [name], MD   20 mg at 07/08/22 1453    Lab Results:  Results for orders placed or performed during the hospital encounter of 03/19/22 (from the past 48 hour(s))  CBC with Differential/Platelet     Status: None   Collection Time: 12/02/22  9:40 AM  Result Value Ref Range   WBC 5.1 4.0 - 10.5 K/uL   RBC 4.43 4.22 - 5.81 MIL/uL   Hemoglobin 13.6 13.0 - 17.0 g/dL   HCT 11.9 14.7 - 82.9 %   MCV 88.5 80.0 - 100.0 fL   MCH 30.7 26.0 - 34.0 pg   MCHC 34.7 30.0 - 36.0 g/dL   RDW 56.2 13.0 - 86.5 %   Platelets 205 150 - 400 K/uL   nRBC 0.0 0.0 - 0.2 %   Neutrophils Relative % 62 %   Neutro Abs 3.2 1.7 - 7.7 K/uL   Lymphocytes Relative 22 %   Lymphs Abs 1.1 0.7 - 4.0 K/uL   Monocytes Relative 13 %   Monocytes Absolute 0.7 0.1 - 1.0 K/uL   Eosinophils Relative 1 %  Eosinophils Absolute 0.1 0.0 - 0.5 K/uL   Basophils Relative 2 %   Basophils Absolute 0.1 0.0 - 0.1 K/uL   Immature Granulocytes 0 %    Abs Immature Granulocytes 0.01 0.00 - 0.07 K/uL    Comment: Performed at [facility], [address] [place], [place] [address]    Blood Alcohol level:  Lab Results  Component Value Date   [facility] <10 07/20/2021    Metabolic Disorder Labs: Lab Results  Component Value Date   HGBA1C 5.5 04/10/2022   MPG 111.15 04/10/2022   No results found for: "PROLACTIN" Lab Results  Component Value Date   CHOL 167 11/20/2021   TRIG 107 11/20/2021   HDL 26 (L) 11/20/2021   CHOLHDL 6.4 11/20/2021   VLDL 21 11/20/2021   LDLCALC 120 (H) 11/20/2021    Physical Findings: AIMS: Facial and Oral Movements Muscles of Facial Expression: None, normal Lips and Perioral Area: None, normal Jaw: None, normal Tongue: None, normal,Extremity Movements Upper (arms, wrists, hands, fingers): None, normal Lower (legs, knees, ankles, toes): None, normal, Trunk Movements Neck, shoulders, hips: None, normal, Overall Severity Severity of abnormal movements (highest score from questions above): None, normal Incapacitation due to abnormal movements: None, normal Patient's awareness of abnormal movements (rate only patient's report): No Awareness, Dental Status Current problems with teeth and/or dentures?: No Does patient usually wear dentures?: No  CIWA:    COWS:     Musculoskeletal: Strength & Muscle Tone: within normal limits Gait & Station: normal Patient leans: N/A  Psychiatric Specialty Exam:  Presentation  General Appearance:  Appropriate for Environment; Casual; Neat; Well Groomed  Eye Contact: Fair  Speech: Slow  Speech Volume: Decreased  Handedness: Left   Mood and Affect  Mood: Anxious  Affect: Congruent   Thought Process  Thought Processes: Goal Directed  Descriptions of Associations:Circumstantial  Orientation:Partial  Thought Content:Scattered  History of Schizophrenia/Schizoaffective disorder:Yes  Duration of Psychotic Symptoms:Greater than six  months  Hallucinations:No data recorded Ideas of Reference:None  Suicidal Thoughts:No data recorded Homicidal Thoughts:No data recorded  Sensorium  Memory: Remote Good  Judgment: Fair  Insight: Fair   Chartered certified accountant: Fair  Attention Span: Fair  Recall: Fair  Fund of Knowledge: Fair  Language: Fair   Psychomotor Activity  Psychomotor Activity:No data recorded  Assets  Assets:No data recorded  Sleep  Sleep:No data recorded   Physical Exam: Physical Exam negative ROS negative Blood pressure 91/74, pulse (!) 104, temperature 98.6 F (37 C), temperature source Oral, resp. rate 18, height 5\' 8"  (1.727 m), weight 85.8 kg, SpO2 99 %. Body mass index is 28.76 kg/m.   Treatment Plan Summary: Daily contact with patient to assess and evaluate symptoms and progress in treatment, Medication management, and Plan continue current medications.  , DO 12/02/2022, 10:41 AM

## 2022-12-02 NOTE — [facility] Group Notes (Signed)
[facility] Group Notes:  (Nursing/MHT/Case Management/Adjunct)  Date:  12/02/2022  Time:  8:45 PM  Type of Therapy:   Wrap up  Participation Level:  Active  Participation Quality:  Appropriate  Affect:  Appropriate  Cognitive:  Alert  Insight:  Good  Engagement in Group:  Engaged and walk in and out of group and he said his name.  Modes of Intervention:  Support  Summary of Progress/Problems:  Adam [name] 12/02/2022, 8:45 PM

## 2022-12-02 NOTE — Progress Notes (Signed)
Patient is alert and oriented x 4, affect is flat but brightens upon approach, he appears ess irritable and interacting appropriately with peers and staff. Patient denies SI/HI/AVH, he was offered emotional support. Patient was complaint with medication regimen no distress noted will continue to monitor.    

## 2022-12-02 NOTE — Plan of Care (Signed)
D: Patient alert and oriented. Patient denies pain. Patient denies anxiety and depression. Patient denies SI/HI/AVH. Patient was in a pleasant mood during shift until after dinner. Patient did not eat dinner and has been yelling "no" in room.   A: Scheduled medications administered to patient, per MD orders.  Support and encouragement provided to patient.  Q15 minute safety checks maintained.   R: Patient compliant with medication administration and treatment plan. No adverse drug reactions noted. Patient remains safe on the unit at this time. Problem: Education: Goal: Knowledge of General Education information will improve Description: Including pain rating scale, medication(s)/side effects and non-pharmacologic comfort measures Outcome: Progressing   Problem: Clinical Measurements: Goal: Ability to maintain clinical measurements within normal limits will improve Outcome: Progressing Goal: Will remain free from infection Outcome: Progressing   Problem: Nutrition: Goal: Adequate nutrition will be maintained Outcome: Progressing   Problem: Activity: Goal: Interest or engagement in leisure activities will improve Outcome: Progressing   Problem: Education: Goal: Will be free of psychotic symptoms Outcome: Progressing

## 2022-12-03 NOTE — Progress Notes (Signed)
[facility] MD Progress Note  12/03/2022 11:43 AM Adam [name]  MRN:  [id number] Subjective: Follow-up 62 year old man with schizophrenia.  No change to behavior.  Wordlessly calling out and "singing" this morning.  Irritable about not being able to get the TV channel that he wanted.  Not aggressive however not fighting not agitated no dangerous behavior.  No new physical issues. 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)   Stroke (HCC)   HTN (hypertension)   HLD (hyperlipidemia)   CAD (coronary artery disease)   Chronic systolic CHF (congestive heart failure) (HCC)   Anxiety   Infection of left earlobe  Total Time spent with patient: 30 minutes  Past Psychiatric History: Past history of schizophrenia and stroke  Past Medical History:  Past Medical History:  Diagnosis Date   Myocardial infarction (HCC)    Schizophrenia (HCC)    Stroke (HCC)     Past Surgical History:  Procedure Laterality Date   NO PAST SURGERIES     Family History:  Family History  Problem Relation Age of Onset   Hypertension Mother    Family Psychiatric  History: See previous Social History:  Social History   Substance and Sexual Activity  Alcohol Use Not Currently     Social History   Substance and Sexual Activity  Drug Use Not Currently    Social History   Socioeconomic History   Marital status: Single    Spouse name: Not on file   Number of children: Not on file   Years of education: Not on file   Highest education level: Not on file  Occupational History   Not on file  Tobacco Use   Smoking status: Never    Passive exposure: Never   Smokeless tobacco: Never  Vaping Use   Vaping Use: Unknown  Substance and Sexual Activity   Alcohol use: Not Currently   Drug use: Not  Currently   Sexual activity: Not Currently  Other Topics Concern   Not on file  Social History Narrative   Not on file   Social Determinants of Health   Financial Resource Strain: Not on file  Food Insecurity: Not on file  Transportation Needs: Not on file  Physical Activity: Not on file  Stress: Not on file  Social Connections: Not on file   Additional Social History:  Specify valuables returned:  (none)                      Sleep: Fair  Appetite:  Fair  Current Medications: Current Facility-Administered Medications  Medication Dose Route Frequency Provider Last Rate Last Admin   acetaminophen (TYLENOL) tablet 650 mg  650 mg Oral Q6H PRN Jonty Morrical T, MD   650 mg at 12/03/22 0920   alum & mag hydroxide-simeth (MAALOX/MYLANTA) 200-200-20 MG/5ML suspension 30 mL  30 mL Oral Q4H PRN Caterine Mcmeans T, MD   30 mL at 04/26/22 0240   aspirin EC tablet 81 mg  81 mg Oral Daily Renise Gillies, [name], MD   81 mg at 12/03/22 0918   atorvastatin (LIPITOR) tablet 20 mg  20 mg Oral Daily Ka Bench T, MD   20 mg at 12/03/22 0918   atropine 1 % ophthalmic solution 1 drop  1 drop Sublingual QID [name], MD   1 drop at 12/03/22 0918   benztropine (COGENTIN)  tablet 0.5 mg  0.5 mg Oral BID Betzabeth Derringer, [name], MD   0.5 mg at 12/03/22 2202   clonazePAM (KLONOPIN) tablet 1 mg  1 mg Oral TID PRN He, [name], MD   1 mg at 11/25/22 0602   cloZAPine (CLOZARIL) tablet 300 mg  300 mg Oral QHS Aitan Rossbach, [name], MD   300 mg at 12/02/22 2127   diphenhydrAMINE (BENADRYL) capsule 50 mg  50 mg Oral Q6H PRN Gilberto Streck, [name], MD   50 mg at 11/29/22 1005   Or   diphenhydrAMINE (BENADRYL) injection 50 mg  50 mg Intramuscular Q6H PRN Dare Sanger, [name], MD       feeding supplement (ENSURE ENLIVE / ENSURE PLUS) liquid 237 mL  237 mL Oral BID BM Ryleigh Buenger T, MD   237 mL at 12/03/22 5427   gabapentin (NEURONTIN) capsule 300 mg  300 mg Oral TID Daishia Fetterly, [name], MD   300 mg at 12/03/22 0623   haloperidol (HALDOL)  tablet 2 mg  2 mg Oral TID Orel Hord, [name], MD   2 mg at 12/03/22 7628   haloperidol (HALDOL) tablet 5 mg  5 mg Oral Q6H PRN Annye Forrey, [name], MD   5 mg at 11/21/22 2132   Or   haloperidol lactate (HALDOL) injection 5 mg  5 mg Intramuscular Q6H PRN Suzzette Gasparro, [name], MD       lisinopril (ZESTRIL) tablet 5 mg  5 mg Oral Daily Dieter Hane, [name], MD   5 mg at 12/03/22 3151   magnesium hydroxide (MILK OF MAGNESIA) suspension 30 mL  30 mL Oral Daily PRN Cormick Moss, [name], MD   30 mL at 11/03/22 0803   metoprolol succinate (TOPROL-XL) 24 hr tablet 25 mg  25 mg Oral Daily Lavi Sheehan T, MD   25 mg at 12/03/22 0917   ondansetron (ZOFRAN) tablet 4 mg  4 mg Oral Q8H PRN Deloria Lair, NP   4 mg at 09/06/22 2320   paliperidone (INVEGA SUSTENNA) injection 156 mg  156 mg Intramuscular Q28 days Flora Parks, [name], MD   156 mg at 11/20/22 1741   paliperidone (INVEGA) 24 hr tablet 6 mg  6 mg Oral QHS Norene Oliveri T, MD   6 mg at 12/02/22 2128   senna-docusate (Senokot-S) tablet 2 tablet  2 tablet Oral QHS PRN [name], DO   2 tablet at 09/15/22 2110   temazepam (RESTORIL) capsule 15 mg  15 mg Oral QHS Haruko Mersch T, MD   15 mg at 12/02/22 2127   ziprasidone (GEODON) injection 20 mg  20 mg Intramuscular Q12H PRN Talene Glastetter, [name], MD   20 mg at 07/08/22 1453    Lab Results:  Results for orders placed or performed during the hospital encounter of 03/19/22 (from the past 48 hour(s))  CBC with Differential/Platelet     Status: None   Collection Time: 12/02/22  9:40 AM  Result Value Ref Range   WBC 5.1 4.0 - 10.5 K/uL   RBC 4.43 4.22 - 5.81 MIL/uL   Hemoglobin 13.6 13.0 - 17.0 g/dL   HCT 39.2 39.0 - 52.0 %   MCV 88.5 80.0 - 100.0 fL   MCH 30.7 26.0 - 34.0 pg   MCHC 34.7 30.0 - 36.0 g/dL   RDW 12.2 11.5 - 15.5 %   Platelets 205 150 - 400 K/uL   nRBC 0.0 0.0 - 0.2 %   Neutrophils Relative % 62 %   Neutro Abs 3.2 1.7 - 7.7  K/uL   Lymphocytes Relative 22 %   Lymphs Abs 1.1 0.7 - 4.0 K/uL    Monocytes Relative 13 %   Monocytes Absolute 0.7 0.1 - 1.0 K/uL   Eosinophils Relative 1 %   Eosinophils Absolute 0.1 0.0 - 0.5 K/uL   Basophils Relative 2 %   Basophils Absolute 0.1 0.0 - 0.1 K/uL   Immature Granulocytes 0 %   Abs Immature Granulocytes 0.01 0.00 - 0.07 K/uL    Comment: Performed at [facility], [address] [place], [place] [address]    Blood Alcohol level:  Lab Results  Component Value Date   [facility] <10 60/45/4098    Metabolic Disorder Labs: Lab Results  Component Value Date   HGBA1C 5.5 04/10/2022   MPG 111.15 04/10/2022   No results found for: "PROLACTIN" Lab Results  Component Value Date   CHOL 167 11/20/2021   TRIG 107 11/20/2021   HDL 26 (L) 11/20/2021   CHOLHDL 6.4 11/20/2021   VLDL 21 11/20/2021   LDLCALC 120 (H) 11/20/2021    Physical Findings: AIMS: Facial and Oral Movements Muscles of Facial Expression: None, normal Lips and Perioral Area: None, normal Jaw: None, normal Tongue: None, normal,Extremity Movements Upper (arms, wrists, hands, fingers): None, normal Lower (legs, knees, ankles, toes): None, normal, Trunk Movements Neck, shoulders, hips: None, normal, Overall Severity Severity of abnormal movements (highest score from questions above): None, normal Incapacitation due to abnormal movements: None, normal Patient's awareness of abnormal movements (rate only patient's report): No Awareness, Dental Status Current problems with teeth and/or dentures?: No Does patient usually wear dentures?: No  CIWA:    COWS:     Musculoskeletal: Strength & Muscle Tone: within normal limits Gait & Station: normal Patient leans: N/A  Psychiatric Specialty Exam:  Presentation  General Appearance:  Appropriate for Environment; Casual; Neat; Well Groomed  Eye Contact: Fair  Speech: Slow  Speech Volume: Decreased  Handedness: Left   Mood and Affect  Mood: Anxious  Affect: Congruent   Thought Process  Thought  Processes: Goal Directed  Descriptions of Associations:Circumstantial  Orientation:Partial  Thought Content:Scattered  History of Schizophrenia/Schizoaffective disorder:Yes  Duration of Psychotic Symptoms:Greater than six months  Hallucinations:No data recorded Ideas of Reference:None  Suicidal Thoughts:No data recorded Homicidal Thoughts:No data recorded  Sensorium  Memory: Remote Good  Judgment: Fair  Insight: Fair   Community education officer  Concentration: Fair  Attention Span: Fair  Recall: [employer] of Knowledge: Fair  Language: Fair   Psychomotor Activity  Psychomotor Activity:No data recorded  Assets  Assets:No data recorded  Sleep  Sleep:No data recorded   Physical Exam: Physical Exam Vitals and nursing note reviewed.  Constitutional:      Appearance: Normal appearance.  HENT:     Head: Normocephalic and atraumatic.     Mouth/Throat:     Pharynx: Oropharynx is clear.  Eyes:     Pupils: Pupils are equal, round, and reactive to light.  Cardiovascular:     Rate and Rhythm: Normal rate and regular rhythm.  Pulmonary:     Effort: Pulmonary effort is normal.     Breath sounds: Normal breath sounds.  Abdominal:     General: Abdomen is flat.     Palpations: Abdomen is soft.  Musculoskeletal:        General: Normal range of motion.  Skin:    General: Skin is warm and dry.  Neurological:     General: No focal deficit present.     Mental Status: He is  alert. Mental status is at baseline.  Psychiatric:        Attention and Perception: He is inattentive.        Mood and Affect: Affect is labile.        Speech: He is noncommunicative.    Review of Systems  Unable to perform ROS: Language   Blood pressure 108/79, pulse (!) 116, temperature 98.1 F (36.7 C), temperature source Oral, resp. rate 20, height 5\' 8"  (1.727 m), weight 85.8 kg, SpO2 100 %. Body mass index is 28.76 kg/m.   Treatment Plan Summary: Medication management and  Plan no change in medication.  Supportive counseling.  Supportive therapy.  No change to behavior.  Really is just working on discharge planning  , MD 12/03/2022, 11:43 AM

## 2022-12-03 NOTE — Plan of Care (Signed)
D: Patient alert and oriented. Patient denies pain. Patient denies anxiety and depression. Patient denies SI/HI/AVH.  Patient came to nurses station yelling endorsing right leg pain. PRN tylenol provided to patient. When reassessed patient stated no pain.  Patient has been in the dayroom during the shift watching television.   A: Scheduled medications administered to patient, per MD orders.  Support and encouragement provided to patient.  Q15 minute safety checks maintained.   R: Patient compliant with medication administration and treatment plan. No adverse drug reactions noted. Patient remains safe on the unit at this time. Problem: Education: Goal: Knowledge of General Education information will improve Description: Including pain rating scale, medication(s)/side effects and non-pharmacologic comfort measures Outcome: Progressing   Problem: Clinical Measurements: Goal: Ability to maintain clinical measurements within normal limits will improve Outcome: Progressing   Problem: Nutrition: Goal: Adequate nutrition will be maintained Outcome: Progressing   Problem: Pain Managment: Goal: General experience of comfort will improve Outcome: Progressing   Problem: Safety: Goal: Ability to remain free from injury will improve Outcome: Progressing   Problem: Activity: Goal: Interest or engagement in leisure activities will improve Outcome: Progressing   Problem: Education: Goal: Will be free of psychotic symptoms Outcome: Progressing

## 2022-12-04 NOTE — Progress Notes (Signed)
[facility] MD Progress Note  12/04/2022 4:32 PM Adam [name]  MRN:  [id number] Subjective: Follow-up 62 year old man with schizophrenia.  No change presentation or behavior. 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[place])   Stroke (HCC)   HTN (hypertension)   HLD (hyperlipidemia)   CAD (coronary artery disease)   Chronic systolic CHF (congestive heart failure) (HCC)   Anxiety   Infection of left earlobe  Total Time spent with patient: 20 minutes  Past Psychiatric History: Past history of schizoaffective disorder or schizophrenia along with a stroke  Past Medical History:  Past Medical History:  Diagnosis Date   Myocardial infarction ([place])    Schizophrenia ([place])    Stroke ([place])     Past Surgical History:  Procedure Laterality Date   NO PAST SURGERIES     Family History:  Family History  Problem Relation Age of Onset   Hypertension Mother    Family Psychiatric  History: See previous Social History:  Social History   Substance and Sexual Activity  Alcohol Use Not Currently     Social History   Substance and Sexual Activity  Drug Use Not Currently    Social History   Socioeconomic History   Marital status: Single    Spouse name: Not on file   Number of children: Not on file   Years of education: Not on file   Highest education level: Not on file  Occupational History   Not on file  Tobacco Use   Smoking status: Never    Passive exposure: Never   Smokeless tobacco: Never  Vaping Use   Vaping Use: Unknown  Substance and Sexual Activity   Alcohol use: Not Currently   Drug use: Not Currently   Sexual activity: Not Currently  Other Topics Concern   Not on file  Social History Narrative   Not on file   Social Determinants of Health   Financial  Resource Strain: Not on file  Food Insecurity: Not on file  Transportation Needs: Not on file  Physical Activity: Not on file  Stress: Not on file  Social Connections: Not on file   Additional Social History:  Specify valuables returned:  (none)                      Sleep: Fair  Appetite:  Fair  Current Medications: Current Facility-Administered Medications  Medication Dose Route Frequency Provider Last Rate Last Admin   acetaminophen (TYLENOL) tablet 650 mg  650 mg Oral Q6H PRN Emmamarie Kluender T, MD   650 mg at 12/04/22 0803   alum & mag hydroxide-simeth (MAALOX/MYLANTA) 200-200-20 MG/5ML suspension 30 mL  30 mL Oral Q4H PRN Brycelyn Gambino T, MD   30 mL at 04/26/22 2725   aspirin EC tablet 81 mg  81 mg Oral Daily Shady Padron, [name], MD   81 mg at 12/04/22 0803   atorvastatin (LIPITOR) tablet 20 mg  20 mg Oral Daily Colon Rueth T, MD   20 mg at 12/04/22 0803   atropine 1 % ophthalmic solution 1 drop  1 drop Sublingual QID [name], MD   1 drop at 12/04/22 1151   benztropine (COGENTIN) tablet 0.5 mg  0.5 mg Oral BID Kaylene Dawn T, MD   0.5 mg at 12/04/22 0803   clonazePAM (KLONOPIN) tablet 1 mg  1 mg Oral TID PRN  He, [name], MD   1 mg at 11/25/22 0602   cloZAPine (CLOZARIL) tablet 300 mg  300 mg Oral QHS Arlyss Weathersby T, MD   300 mg at 12/03/22 2117   diphenhydrAMINE (BENADRYL) capsule 50 mg  50 mg Oral Q6H PRN Deneshia Zucker T, MD   50 mg at 11/29/22 1005   Or   diphenhydrAMINE (BENADRYL) injection 50 mg  50 mg Intramuscular Q6H PRN Maudell Stanbrough T, MD       feeding supplement (ENSURE ENLIVE / ENSURE PLUS) liquid 237 mL  237 mL Oral BID BM Titianna Loomis T, MD   237 mL at 12/04/22 1020   gabapentin (NEURONTIN) capsule 300 mg  300 mg Oral TID Jerilyn Gillaspie T, MD   300 mg at 12/04/22 1018   haloperidol (HALDOL) tablet 2 mg  2 mg Oral TID Haskell Rihn T, MD   2 mg at 12/04/22 1018   haloperidol (HALDOL) tablet 5 mg  5 mg Oral Q6H PRN Roi Jafari T, MD   5 mg at 11/21/22 2132    Or   haloperidol lactate (HALDOL) injection 5 mg  5 mg Intramuscular Q6H PRN Carthel Castille T, MD       lisinopril (ZESTRIL) tablet 5 mg  5 mg Oral Daily Ludmila Ebarb T, MD   5 mg at 12/04/22 3716   magnesium hydroxide (MILK OF MAGNESIA) suspension 30 mL  30 mL Oral Daily PRN Enya Bureau T, MD   30 mL at 12/03/22 1526   metoprolol succinate (TOPROL-XL) 24 hr tablet 25 mg  25 mg Oral Daily Ori Kreiter T, MD   25 mg at 12/04/22 0803   ondansetron (ZOFRAN) tablet 4 mg  4 mg Oral Q8H PRN [name], NP   4 mg at 09/06/22 2320   paliperidone (INVEGA SUSTENNA) injection 156 mg  156 mg Intramuscular Q28 days Demira Gwynne T, MD   156 mg at 11/20/22 1741   paliperidone (INVEGA) 24 hr tablet 6 mg  6 mg Oral QHS Lizzet Hendley T, MD   6 mg at 12/03/22 2117   senna-docusate (Senokot-S) tablet 2 tablet  2 tablet Oral QHS PRN [name], DO   2 tablet at 09/15/22 2110   temazepam (RESTORIL) capsule 15 mg  15 mg Oral QHS Jesse Nosbisch T, MD   15 mg at 12/03/22 2117   ziprasidone (GEODON) injection 20 mg  20 mg Intramuscular Q12H PRN Myrah Strawderman, [name], MD   20 mg at 07/08/22 1453    Lab Results: No results found for this or any previous visit (from the past 48 hour(s)).  Blood Alcohol level:  Lab Results  Component Value Date   ETH <10 96/78/9381    Metabolic Disorder Labs: Lab Results  Component Value Date   HGBA1C 5.5 04/10/2022   MPG 111.15 04/10/2022   No results found for: "PROLACTIN" Lab Results  Component Value Date   CHOL 167 11/20/2021   TRIG 107 11/20/2021   HDL 26 (L) 11/20/2021   CHOLHDL 6.4 11/20/2021   VLDL 21 11/20/2021   LDLCALC 120 (H) 11/20/2021    Physical Findings: AIMS: Facial and Oral Movements Muscles of Facial Expression: None, normal Lips and Perioral Area: None, normal Jaw: None, normal Tongue: None, normal,Extremity Movements Upper (arms, wrists, hands, fingers): None, normal Lower (legs, knees, ankles, toes): None, normal, Trunk  Movements Neck, shoulders, hips: None, normal, Overall Severity Severity of abnormal movements (highest score from questions above): None, normal Incapacitation due to abnormal movements: None, normal  Patient's awareness of abnormal movements (rate only patient's report): No Awareness, Dental Status Current problems with teeth and/or dentures?: No Does patient usually wear dentures?: No  CIWA:    COWS:     Musculoskeletal: Strength & Muscle Tone: within normal limits Gait & Station: normal Patient leans: N/A  Psychiatric Specialty Exam:  Presentation  General Appearance:  Appropriate for Environment; Casual; Neat; Well Groomed  Eye Contact: Fair  Speech: Slow  Speech Volume: Decreased  Handedness: Left   Mood and Affect  Mood: Anxious  Affect: Congruent   Thought Process  Thought Processes: Goal Directed  Descriptions of Associations:Circumstantial  Orientation:Partial  Thought Content:Scattered  History of Schizophrenia/Schizoaffective disorder:Yes  Duration of Psychotic Symptoms:Greater than six months  Hallucinations:No data recorded Ideas of Reference:None  Suicidal Thoughts:No data recorded Homicidal Thoughts:No data recorded  Sensorium  Memory: Remote Good  Judgment: Fair  Insight: Fair   Art therapist  Concentration: Fair  Attention Span: Fair  Recall: Fair  Fund of Knowledge: Fair  Language: Fair   Psychomotor Activity  Psychomotor Activity:No data recorded  Assets  Assets:No data recorded  Sleep  Sleep:No data recorded   Physical Exam: Physical Exam Vitals reviewed.  Constitutional:      Appearance: Normal appearance.  HENT:     Head: Normocephalic and atraumatic.     Mouth/Throat:     Pharynx: Oropharynx is clear.  Eyes:     Pupils: Pupils are equal, round, and reactive to light.  Cardiovascular:     Rate and Rhythm: Normal rate and regular rhythm.  Pulmonary:     Effort: Pulmonary effort  is normal.     Breath sounds: Normal breath sounds.  Abdominal:     General: Abdomen is flat.     Palpations: Abdomen is soft.  Musculoskeletal:        General: Normal range of motion.  Skin:    General: Skin is warm and dry.  Neurological:     General: No focal deficit present.     Mental Status: He is alert. Mental status is at baseline.  Psychiatric:        Mood and Affect: Mood normal.        Thought Content: Thought content normal.    Review of Systems  Constitutional: Negative.   HENT: Negative.    Eyes: Negative.   Respiratory: Negative.    Cardiovascular: Negative.   Gastrointestinal: Negative.   Musculoskeletal: Negative.   Skin: Negative.   Neurological: Negative.   Psychiatric/Behavioral: Negative.     Blood pressure 125/83, pulse 94, temperature 98 F (36.7 C), temperature source Oral, resp. rate 20, height 5\' 8"  (1.727 m), weight 85.8 kg, SpO2 99 %. Body mass index is 28.76 kg/m.   Treatment Plan Summary: Plan no change to medication.  Continue to work on placement plans  , MD 12/04/2022, 4:32 PM

## 2022-12-04 NOTE — Group Note (Signed)
[facility] LCSW Group Therapy Note    Group Date: 12/04/2022 Start Time: 1300 End Time: 1400  Type of Therapy and Topic:  Group Therapy:  Overcoming Obstacles  Participation Level:  [facility] PARTICIPATION LEVEL: Did Not Attend  Mood:  Description of Group:   In this group patients will be encouraged to explore what they see as obstacles to their own wellness and recovery. They will be guided to discuss their thoughts, feelings, and behaviors related to these obstacles. The group will process together ways to cope with barriers, with attention given to specific choices patients can make. Each patient will be challenged to identify changes they are motivated to make in order to overcome their obstacles. This group will be process-oriented, with patients participating in exploration of their own experiences as well as giving and receiving support and challenge from other group members.  Therapeutic Goals: 1. Patient will identify personal and current obstacles as they relate to admission. 2. Patient will identify barriers that currently interfere with their wellness or overcoming obstacles.  3. Patient will identify feelings, thought process and behaviors related to these barriers. 4. Patient will identify two changes they are willing to make to overcome these obstacles:    Summary of Patient Progress Patient did not attend group, though encouraged to do so by this clinician.    Therapeutic Modalities:   Cognitive Behavioral Therapy Solution Focused Therapy Motivational Interviewing Relapse Prevention Therapy   Rozann Lesches, LCSW

## 2022-12-04 NOTE — Progress Notes (Signed)
   12/04/22 0500  Psych Admission Type (Psych Patients Only)  Admission Status Involuntary  Psychosocial Assessment  Patient Complaints None  Eye Contact Fair  Facial Expression Animated  Affect Appropriate to circumstance  Speech Aphasic  Interaction Assertive  Motor Activity Slow  Appearance/Hygiene Unremarkable  Behavior Characteristics Cooperative  Mood Pleasant  Thought Process  Coherency Circumstantial  Content WDL  Delusions None reported or observed  Perception WDL  Hallucination None reported or observed  Judgment Impaired  Confusion None  Danger to Self  Current suicidal ideation? Denies  Agreement Not to Harm Self Yes  Description of Agreement Verbal  Danger to Others  Danger to Others None reported or observed  Danger to Others Abnormal  Harmful Behavior to others No threats or harm toward other people

## 2022-12-04 NOTE — Plan of Care (Signed)
D: Patient alert and oriented. Patient endorses pain in the right leg. PRN tylenol was given to patient. Patient denies anxiety and depression. Patient denies SI/HI/AVH.  A: Scheduled medications administered to patient, per MD orders.  Support and encouragement provided to patient.  Q15 minute safety checks maintained.   R: Patient compliant with medication administration and treatment plan. No adverse drug reactions noted. Patient remains safe on the unit at this time. Problem: Education: Goal: Knowledge of General Education information will improve Description: Including pain rating scale, medication(s)/side effects and non-pharmacologic comfort measures Outcome: Progressing   Problem: Health Behavior/Discharge Planning: Goal: Ability to manage health-related needs will improve Outcome: Progressing   Problem: Clinical Measurements: Goal: Ability to maintain clinical measurements within normal limits will improve Outcome: Progressing   Problem: Nutrition: Goal: Adequate nutrition will be maintained Outcome: Progressing   Problem: Pain Managment: Goal: General experience of comfort will improve Outcome: Progressing   Problem: Education: Goal: Will be free of psychotic symptoms Outcome: Progressing

## 2022-12-04 NOTE — [facility] Group Notes (Signed)
[facility] Group Notes:  (Nursing/MHT/Case Management/Adjunct)  Date:  12/04/2022  Time:  9:22 AM  Type of Therapy:   Community Meeting  Participation Level:  Did Not Attend   Adam [name] Travis Dorell Gatlin 12/04/2022, 9:22 AM 

## 2022-12-04 NOTE — Progress Notes (Signed)
Recreation Therapy Notes    Date: 12/04/2022  Time: 10:00 am  Location: Craft room     Behavioral response: N/A   Intervention Topic: Teamwork    Discussion/Intervention: Patient refused to attend group.   Clinical Observations/Feedback:  Patient refused to attend group.    Linnie Delgrande LRT/CTRS        Tekila Caillouet 12/04/2022 10:49 AM 

## 2022-12-05 NOTE — Group Note (Signed)
[facility] LCSW Group Therapy Note   Group Date: 12/05/2022 Start Time: 1300 End Time: 1400   Type of Therapy/Topic:  Group Therapy:  Emotion Regulation  Participation Level:  Minimal    Description of Group:    The purpose of this group is to assist patients in learning to regulate negative emotions and experience positive emotions. Patients will be guided to discuss ways in which they have been vulnerable to their negative emotions. These vulnerabilities will be juxtaposed with experiences of positive emotions or situations, and patients challenged to use positive emotions to combat negative ones. Special emphasis will be placed on coping with negative emotions in conflict situations, and patients will process healthy conflict resolution skills.  Therapeutic Goals: Patient will identify two positive emotions or experiences to reflect on in order to balance out negative emotions:  Patient will label two or more emotions that they find the most difficult to experience:  Patient will be able to demonstrate positive conflict resolution skills through discussion or role plays:   Summary of Patient Progress: Patient was present for the entirety of the group process. He would say random comments like "Merry Proud" and "good day". During group he sat quietly and appeared to attend to the conversation.    Therapeutic Modalities:   Cognitive Behavioral Therapy Feelings Identification Dialectical Behavioral Therapy   Shirl Harris, LCSW

## 2022-12-05 NOTE — Progress Notes (Signed)
Pt denies SI/HI/AVH and verbally agrees to approach staff if these become apparent or before harming themselves/others. Rates pain 4 with faces. Pt has had no issues today. Scheduled medications administered to pt, per MD orders. RN provided support and encouragement to pt. Q15 min safety checks implemented and continued. Pt safe on the unit. RN will continue to monitor and intervene as needed.  12/05/22 0900  Psych Admission Type (Psych Patients Only)  Admission Status Involuntary  Psychosocial Assessment  Patient Complaints None  Eye Contact Fair  Facial Expression Animated  Affect Appropriate to circumstance  Speech Aphasic  Interaction Assertive  Motor Activity Slow  Appearance/Hygiene Unremarkable  Behavior Characteristics Cooperative;Appropriate to situation;Calm  Mood Pleasant  Aggressive Behavior  Effect No apparent injury  Thought Process  Coherency Concrete thinking  Content WDL  Delusions None reported or observed  Perception WDL  Hallucination None reported or observed  Judgment Impaired  Confusion None  Danger to Self  Current suicidal ideation? Denies  Danger to Others  Danger to Others None reported or observed  Danger to Others Abnormal  Harmful Behavior to others No threats or harm toward other people  Destructive Behavior No threats or harm toward property

## 2022-12-05 NOTE — Progress Notes (Signed)
Patient calm and pleasant during assessment. Pt denies SI/HI/AVH. Pt observed interacting appropriately with staff and peers on the unit. Pt compliant with medication administration per MD orders. Pt given education, support, and encouragement to be active in his treatment plan. Pt being monitored Q 15 minutes for safety per unit protocol, remains safe on the unit    

## 2022-12-05 NOTE — Progress Notes (Signed)
[facility] MD Progress Note  12/05/2022 11:21 AM Jaquawn Saffran  MRN:  [id number] Subjective: Follow-up patient with schizophrenia.  No new complaints.  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[place])   Stroke (HCC)   HTN (hypertension)   HLD (hyperlipidemia)   CAD (coronary artery disease)   Chronic systolic CHF (congestive heart failure) (HCC)   Anxiety   Infection of left earlobe  Total Time spent with patient: 20 minutes  Past Psychiatric History: Past history of schizophrenia  Past Medical History:  Past Medical History:  Diagnosis Date   Myocardial infarction ([place])    Schizophrenia ([place])    Stroke ([place])     Past Surgical History:  Procedure Laterality Date   NO PAST SURGERIES     Family History:  Family History  Problem Relation Age of Onset   Hypertension Mother    Family Psychiatric  History: See previous Social History:  Social History   Substance and Sexual Activity  Alcohol Use Not Currently     Social History   Substance and Sexual Activity  Drug Use Not Currently    Social History   Socioeconomic History   Marital status: Single    Spouse name: Not on file   Number of children: Not on file   Years of education: Not on file   Highest education level: Not on file  Occupational History   Not on file  Tobacco Use   Smoking status: Never    Passive exposure: Never   Smokeless tobacco: Never  Vaping Use   Vaping Use: Unknown  Substance and Sexual Activity   Alcohol use: Not Currently   Drug use: Not Currently   Sexual activity: Not Currently  Other Topics Concern   Not on file  Social History Narrative   Not on file   Social Determinants of Health   Financial Resource Strain: Not on file  Food Insecurity:  Not on file  Transportation Needs: Not on file  Physical Activity: Not on file  Stress: Not on file  Social Connections: Not on file   Additional Social History:  Specify valuables returned:  (none)                      Sleep: Fair  Appetite:  Fair  Current Medications: Current Facility-Administered Medications  Medication Dose Route Frequency Provider Last Rate Last Admin   acetaminophen (TYLENOL) tablet 650 mg  650 mg Oral Q6H PRN Yari Szeliga T, MD   650 mg at 12/05/22 0902   alum & mag hydroxide-simeth (MAALOX/MYLANTA) 200-200-20 MG/5ML suspension 30 mL  30 mL Oral Q4H PRN Osmond Steckman T, MD   30 mL at 04/26/22 3532   aspirin EC tablet 81 mg  81 mg Oral Daily Keyshia Orwick T, MD   81 mg at 12/05/22 0900   atorvastatin (LIPITOR) tablet 20 mg  20 mg Oral Daily Azarel Banner T, MD   20 mg at 12/05/22 0900   atropine 1 % ophthalmic solution 1 drop  1 drop Sublingual QID [name], MD   1 drop at 12/05/22 1108   benztropine (COGENTIN) tablet 0.5 mg  0.5 mg Oral BID Jasslyn Finkel T, MD   0.5 mg at 12/05/22 0859   clonazePAM (KLONOPIN) tablet 1 mg  1 mg Oral TID PRN He, [name], MD  1 mg at 11/25/22 0602   cloZAPine (CLOZARIL) tablet 300 mg  300 mg Oral QHS Judine Arciniega T, MD   300 mg at 12/04/22 2102   diphenhydrAMINE (BENADRYL) capsule 50 mg  50 mg Oral Q6H PRN Emerson Schreifels T, MD   50 mg at 11/29/22 1005   Or   diphenhydrAMINE (BENADRYL) injection 50 mg  50 mg Intramuscular Q6H PRN Tylyn Derwin T, MD       feeding supplement (ENSURE ENLIVE / ENSURE PLUS) liquid 237 mL  237 mL Oral BID BM Rasheen Schewe T, MD   237 mL at 12/05/22 0905   gabapentin (NEURONTIN) capsule 300 mg  300 mg Oral TID Cortnie Ringel T, MD   300 mg at 12/05/22 2707   haloperidol (HALDOL) tablet 2 mg  2 mg Oral TID Roye Gustafson, [name], MD   2 mg at 12/05/22 8675   haloperidol (HALDOL) tablet 5 mg  5 mg Oral Q6H PRN Everlean Bucher T, MD   5 mg at 11/21/22 2132   Or   haloperidol lactate (HALDOL) injection 5  mg  5 mg Intramuscular Q6H PRN Selmer Adduci T, MD       lisinopril (ZESTRIL) tablet 5 mg  5 mg Oral Daily Macy Polio T, MD   5 mg at 12/05/22 0900   magnesium hydroxide (MILK OF MAGNESIA) suspension 30 mL  30 mL Oral Daily PRN Sebron Mcmahill T, MD   30 mL at 12/03/22 1526   metoprolol succinate (TOPROL-XL) 24 hr tablet 25 mg  25 mg Oral Daily Corra Kaine T, MD   25 mg at 12/05/22 0900   ondansetron (ZOFRAN) tablet 4 mg  4 mg Oral Q8H PRN Jearld Lesch, NP   4 mg at 09/06/22 2320   paliperidone (INVEGA SUSTENNA) injection 156 mg  156 mg Intramuscular Q28 days Keari Miu T, MD   156 mg at 11/20/22 1741   paliperidone (INVEGA) 24 hr tablet 6 mg  6 mg Oral QHS Alden Feagan T, MD   6 mg at 12/04/22 2102   senna-docusate (Senokot-S) tablet 2 tablet  2 tablet Oral QHS PRN Sarina Ill, DO   2 tablet at 09/15/22 2110   temazepam (RESTORIL) capsule 15 mg  15 mg Oral QHS Nai Borromeo T, MD   15 mg at 12/04/22 2103   ziprasidone (GEODON) injection 20 mg  20 mg Intramuscular Q12H PRN Haleemah Buckalew, [name], MD   20 mg at 07/08/22 1453    Lab Results: No results found for this or any previous visit (from the past 48 hour(s)).  Blood Alcohol level:  Lab Results  Component Value Date   ETH <10 07/20/2021    Metabolic Disorder Labs: Lab Results  Component Value Date   HGBA1C 5.5 04/10/2022   MPG 111.15 04/10/2022   No results found for: "PROLACTIN" Lab Results  Component Value Date   CHOL 167 11/20/2021   TRIG 107 11/20/2021   HDL 26 (L) 11/20/2021   CHOLHDL 6.4 11/20/2021   VLDL 21 11/20/2021   LDLCALC 120 (H) 11/20/2021    Physical Findings: AIMS: Facial and Oral Movements Muscles of Facial Expression: None, normal Lips and Perioral Area: None, normal Jaw: None, normal Tongue: None, normal,Extremity Movements Upper (arms, wrists, hands, fingers): None, normal Lower (legs, knees, ankles, toes): None, normal, Trunk Movements Neck, shoulders, hips: None, normal, Overall  Severity Severity of abnormal movements (highest score from questions above): None, normal Incapacitation due to abnormal movements: None, normal Patient's awareness of abnormal movements (  rate only patient's report): No Awareness, Dental Status Current problems with teeth and/or dentures?: No Does patient usually wear dentures?: No  CIWA:    COWS:     Musculoskeletal: Strength & Muscle Tone: within normal limits Gait & Station: normal Patient leans: N/A  Psychiatric Specialty Exam:  Presentation  General Appearance:  Appropriate for Environment; Casual; Neat; Well Groomed  Eye Contact: Fair  Speech: Slow  Speech Volume: Decreased  Handedness: Left   Mood and Affect  Mood: Anxious  Affect: Congruent   Thought Process  Thought Processes: Goal Directed  Descriptions of Associations:Circumstantial  Orientation:Partial  Thought Content:Scattered  History of Schizophrenia/Schizoaffective disorder:Yes  Duration of Psychotic Symptoms:Greater than six months  Hallucinations:No data recorded Ideas of Reference:None  Suicidal Thoughts:No data recorded Homicidal Thoughts:No data recorded  Sensorium  Memory: Remote Good  Judgment: Fair  Insight: Fair   Community education officer  Concentration: Fair  Attention Span: Fair  Recall: [place] of Knowledge: Fair  Language: Fair   Psychomotor Activity  Psychomotor Activity:No data recorded  Assets  Assets:No data recorded  Sleep  Sleep:No data recorded   Physical Exam: Physical Exam Vitals reviewed.  Constitutional:      Appearance: Normal appearance.  HENT:     Head: Normocephalic and atraumatic.     Mouth/Throat:     Pharynx: Oropharynx is clear.  Eyes:     Pupils: Pupils are equal, round, and reactive to light.  Cardiovascular:     Rate and Rhythm: Normal rate and regular rhythm.  Pulmonary:     Effort: Pulmonary effort is normal.     Breath sounds: Normal breath sounds.   Abdominal:     General: Abdomen is flat.     Palpations: Abdomen is soft.  Musculoskeletal:        General: Normal range of motion.  Skin:    General: Skin is warm and dry.  Neurological:     General: No focal deficit present.     Mental Status: He is alert. Mental status is at baseline.  Psychiatric:        Attention and Perception: He is inattentive.        Mood and Affect: Affect is blunt.        Speech: He is noncommunicative.    Review of Systems  Unable to perform ROS: Language   Blood pressure 116/83, pulse 89, temperature 98 F (36.7 C), temperature source Oral, resp. rate 20, height 5\' 8"  (1.727 m), weight 85.8 kg, SpO2 98 %. Body mass index is 28.76 kg/m.   Treatment Plan Summary: Plan no new complaints no indication for any change to treatment plan.  Continues to be completely waiting for placement.  Alethia Berthold, MD 12/05/2022, 11:21 AM

## 2022-12-05 NOTE — [facility] Group Notes (Signed)
[facility] Group Notes:  (Nursing/MHT/Case Management/Adjunct)  Date:  12/05/2022  Time:  11:04 AM  Type of Therapy:   Community Meeting  Participation Level:  Did Not Attend   Amisha Pospisil A Ondre Salvetti 12/05/2022, 11:04 AM

## 2022-12-05 NOTE — [facility] Counselor (Addendum)
CSW team Heath Lark F., Myrtha Mantis., and Rebeca Alert.) met with supervisors Guinevere Ferrari., and Gypsy Balsam.) along with [facility] members Delle Reining., and Moishe Spice.) and unit attending (Dr. Weber Cooks) via Microsoft Teams to discuss pt and placement. Keeley updated team that [facility] is willing to take pt but only if they get enhanced funding and are able to get one to one care for him. [facility] explained that [facility] will need to complete the paperwork for this enhanced funding in order to even begin that process. Zack updated group that neurocognitive facilities representative told him that their beds are for people who have no other options and as pt has some on the table they did not plan to advance his name on their waitlist. Team gave update regarding contacts with [facility] (still waiting to hear back but have completed all the necessary paperwork for them as requested). Dr. Weber Cooks informed team that pt remains psychiatrically cleared and remains here waiting for placement.   Action items:  [facility] to work on getting [facility] to complete paperwork for enhanced funding.   [facility] CSW team to continue making contact with [facility].   No other concerns expressed. Contact ended without incident.   [name]. Guerry Bruin, MSW, [place], [place] 12/05/2022 2:29 PM

## 2022-12-05 NOTE — Plan of Care (Signed)
Pt behavior at baseline this shift; pt was excited due to watching game on TV, but in pleasant spirits.  Pt was compliant with medication; reports no SI, HI or physical pain.  Continued monitoring via q 15 min checks.

## 2022-12-05 NOTE — Plan of Care (Signed)
  Problem: Self-Concept: Goal: Level of anxiety will decrease Outcome: Progressing   Problem: Education: Goal: Knowledge of General Education information will improve Description: Including pain rating scale, medication(s)/side effects and non-pharmacologic comfort measures Outcome: Progressing   Problem: Clinical Measurements: Goal: Will remain free from infection Outcome: Progressing   Problem: Nutrition: Goal: Adequate nutrition will be maintained Outcome: Progressing

## 2022-12-06 NOTE — Progress Notes (Signed)
[facility] MD Progress Note  12/06/2022 11:13 AM Adam [name]  MRN:  [id number] Subjective: Follow-up patient with schizophrenia.  No new complaints.  No behavior problems.  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)   Stroke (HCC)   HTN (hypertension)   HLD (hyperlipidemia)   CAD (coronary artery disease)   Chronic systolic CHF (congestive heart failure) (HCC)   Anxiety   Infection of left earlobe  Total Time spent with patient: 30 minutes  Past Psychiatric History: Past history of schizophrenia and stroke  Past Medical History:  Past Medical History:  Diagnosis Date   Myocardial infarction (HCC)    Schizophrenia (HCC)    Stroke (HCC)     Past Surgical History:  Procedure Laterality Date   NO PAST SURGERIES     Family History:  Family History  Problem Relation Age of Onset   Hypertension Mother    Family Psychiatric  History: See previous Social History:  Social History   Substance and Sexual Activity  Alcohol Use Not Currently     Social History   Substance and Sexual Activity  Drug Use Not Currently    Social History   Socioeconomic History   Marital status: Single    Spouse name: Not on file   Number of children: Not on file   Years of education: Not on file   Highest education level: Not on file  Occupational History   Not on file  Tobacco Use   Smoking status: Never    Passive exposure: Never   Smokeless tobacco: Never  Vaping Use   Vaping Use: Unknown  Substance and Sexual Activity   Alcohol use: Not Currently   Drug use: Not Currently   Sexual activity: Not Currently  Other Topics Concern   Not on file  Social History Narrative   Not on file   Social Determinants of Health   Financial Resource  Strain: Not on file  Food Insecurity: Not on file  Transportation Needs: Not on file  Physical Activity: Not on file  Stress: Not on file  Social Connections: Not on file   Additional Social History:  Specify valuables returned:  (none)                      Sleep: Fair  Appetite:  Fair  Current Medications: Current Facility-Administered Medications  Medication Dose Route Frequency Provider Last Rate Last Admin   acetaminophen (TYLENOL) tablet 650 mg  650 mg Oral Q6H PRN Arrayah Connors T, MD   650 mg at 12/06/22 1001   alum & mag hydroxide-simeth (MAALOX/MYLANTA) 200-200-20 MG/5ML suspension 30 mL  30 mL Oral Q4H PRN Bijan Ridgley T, MD   30 mL at 04/26/22 4709   aspirin EC tablet 81 mg  81 mg Oral Daily Suren Payne, [name], MD   81 mg at 12/06/22 0952   atorvastatin (LIPITOR) tablet 20 mg  20 mg Oral Daily Tahmid Stonehocker T, MD   20 mg at 12/06/22 0952   atropine 1 % ophthalmic solution 1 drop  1 drop Sublingual QID [name], MD   1 drop at 12/06/22 0955   benztropine (COGENTIN) tablet 0.5 mg  0.5 mg Oral BID Sayre Witherington T, MD   0.5 mg at 12/06/22 0952   clonazePAM (KLONOPIN) tablet 1 mg  1 mg Oral  TID PRN He, [name], MD   1 mg at 11/25/22 0602   cloZAPine (CLOZARIL) tablet 300 mg  300 mg Oral QHS Tommey Barret T, MD   300 mg at 12/05/22 2102   diphenhydrAMINE (BENADRYL) capsule 50 mg  50 mg Oral Q6H PRN Mareli Antunes T, MD   50 mg at 11/29/22 1005   Or   diphenhydrAMINE (BENADRYL) injection 50 mg  50 mg Intramuscular Q6H PRN Bexton Haak T, MD       feeding supplement (ENSURE ENLIVE / ENSURE PLUS) liquid 237 mL  237 mL Oral BID BM Dann Ventress T, MD   237 mL at 12/06/22 0955   gabapentin (NEURONTIN) capsule 300 mg  300 mg Oral TID Coyt Govoni T, MD   300 mg at 12/06/22 0951   haloperidol (HALDOL) tablet 2 mg  2 mg Oral TID Lehi Phifer, [name], MD   2 mg at 12/06/22 0998   haloperidol (HALDOL) tablet 5 mg  5 mg Oral Q6H PRN Catheryn Slifer T, MD   5 mg at 11/21/22 2132   Or    haloperidol lactate (HALDOL) injection 5 mg  5 mg Intramuscular Q6H PRN Adrianne Shackleton T, MD       lisinopril (ZESTRIL) tablet 5 mg  5 mg Oral Daily Saed Hudlow T, MD   5 mg at 12/06/22 0959   magnesium hydroxide (MILK OF MAGNESIA) suspension 30 mL  30 mL Oral Daily PRN Camey Edell T, MD   30 mL at 12/03/22 1526   metoprolol succinate (TOPROL-XL) 24 hr tablet 25 mg  25 mg Oral Daily Dee Paden T, MD   25 mg at 12/06/22 0959   ondansetron (ZOFRAN) tablet 4 mg  4 mg Oral Q8H PRN [name], NP   4 mg at 09/06/22 2320   paliperidone (INVEGA SUSTENNA) injection 156 mg  156 mg Intramuscular Q28 days Iram Astorino T, MD   156 mg at 11/20/22 1741   paliperidone (INVEGA) 24 hr tablet 6 mg  6 mg Oral QHS Charliene Inoue T, MD   6 mg at 12/05/22 2102   senna-docusate (Senokot-S) tablet 2 tablet  2 tablet Oral QHS PRN [name], DO   2 tablet at 09/15/22 2110   temazepam (RESTORIL) capsule 15 mg  15 mg Oral QHS Winn Muehl T, MD   15 mg at 12/05/22 2102   ziprasidone (GEODON) injection 20 mg  20 mg Intramuscular Q12H PRN Tiffanny Lamarche, [name], MD   20 mg at 07/08/22 1453    Lab Results: No results found for this or any previous visit (from the past 54 hour(s)).  Blood Alcohol level:  Lab Results  Component Value Date   ETH <10 33/82/5053    Metabolic Disorder Labs: Lab Results  Component Value Date   HGBA1C 5.5 04/10/2022   MPG 111.15 04/10/2022   No results found for: "PROLACTIN" Lab Results  Component Value Date   CHOL 167 11/20/2021   TRIG 107 11/20/2021   HDL 26 (L) 11/20/2021   CHOLHDL 6.4 11/20/2021   VLDL 21 11/20/2021   LDLCALC 120 (H) 11/20/2021    Physical Findings: AIMS: Facial and Oral Movements Muscles of Facial Expression: None, normal Lips and Perioral Area: None, normal Jaw: None, normal Tongue: None, normal,Extremity Movements Upper (arms, wrists, hands, fingers): None, normal Lower (legs, knees, ankles, toes): None, normal, Trunk Movements Neck,  shoulders, hips: None, normal, Overall Severity Severity of abnormal movements (highest score from questions above): None, normal Incapacitation due to abnormal movements:  None, normal Patient's awareness of abnormal movements (rate only patient's report): No Awareness, Dental Status Current problems with teeth and/or dentures?: No Does patient usually wear dentures?: No  CIWA:    COWS:     Musculoskeletal: Strength & Muscle Tone: within normal limits Gait & Station: normal Patient leans: N/A  Psychiatric Specialty Exam:  Presentation  General Appearance:  Appropriate for Environment; Casual; Neat; Well Groomed  Eye Contact: Fair  Speech: Slow  Speech Volume: Decreased  Handedness: Left   Mood and Affect  Mood: Anxious  Affect: Congruent   Thought Process  Thought Processes: Goal Directed  Descriptions of Associations:Circumstantial  Orientation:Partial  Thought Content:Scattered  History of Schizophrenia/Schizoaffective disorder:Yes  Duration of Psychotic Symptoms:Greater than six months  Hallucinations:No data recorded Ideas of Reference:None  Suicidal Thoughts:No data recorded Homicidal Thoughts:No data recorded  Sensorium  Memory: Remote Good  Judgment: Fair  Insight: Fair   Community education officer  Concentration: Fair  Attention Span: Fair  Recall: [employer] of Knowledge: Fair  Language: Fair   Psychomotor Activity  Psychomotor Activity:No data recorded  Assets  Assets:No data recorded  Sleep  Sleep:No data recorded   Physical Exam: Physical Exam Vitals and nursing note reviewed.  Constitutional:      Appearance: Normal appearance.  HENT:     Head: Normocephalic and atraumatic.     Mouth/Throat:     Pharynx: Oropharynx is clear.  Eyes:     Pupils: Pupils are equal, round, and reactive to light.  Cardiovascular:     Rate and Rhythm: Normal rate and regular rhythm.  Pulmonary:     Effort: Pulmonary effort  is normal.     Breath sounds: Normal breath sounds.  Abdominal:     General: Abdomen is flat.     Palpations: Abdomen is soft.  Musculoskeletal:        General: Normal range of motion.  Skin:    General: Skin is warm and dry.  Neurological:     General: No focal deficit present.     Mental Status: He is alert. Mental status is at baseline.  Psychiatric:        Attention and Perception: He is inattentive.        Mood and Affect: Affect is blunt.        Speech: He is noncommunicative.    Review of Systems  Unable to perform ROS: Language   Blood pressure 127/84, pulse 92, temperature 98.2 F (36.8 C), temperature source Oral, resp. rate 20, height 5\' 8"  (1.727 m), weight 85.8 kg, SpO2 99 %. Body mass index is 28.76 kg/m.   Treatment Plan Summary: Medication management and Plan no change to medication management.  Supportive counseling and daily check-in for rapport.  Primarily just focused on discharge planning  Alethia Berthold, MD 12/06/2022, 11:13 AM

## 2022-12-06 NOTE — Group Note (Signed)
LCSW Group Therapy Note  Group Date: 12/06/2022 Start Time: 1300 End Time: 1400   Type of Therapy and Topic:  Group Therapy: Positive Affirmations  Participation Level:  None   Description of Group:   This group addressed positive affirmation towards self and others.  Patients went around the room and identified two positive things about themselves and two positive things about a peer in the room.  Patients reflected on how it felt to share something positive with others, to identify positive things about themselves, and to hear positive things from others/ Patients were encouraged to have a daily reflection of positive characteristics or circumstances.   Therapeutic Goals: Patients will verbalize two of their positive qualities Patients will demonstrate empathy for others by stating two positive qualities about a peer in the group Patients will verbalize their feelings when voicing positive self affirmations and when voicing positive affirmations of others Patients will discuss the potential positive impact on their wellness/recovery of focusing on positive traits of self and others.  Summary of Patient Progress:   Patient arrived to group late.  Patient was unable to participate in group.  Patient sat with his head in his hands.  Patient left group, shortly after arrival, giving a thumbs up to another patient.   Therapeutic Modalities:   Cognitive Behavioral Therapy Motivational Interviewing    Rozann Lesches, Latanya Presser 12/06/2022  1:53 PM

## 2022-12-06 NOTE — Progress Notes (Signed)
Patient calm and pleasant during assessment. Pt denies SI/HI/AVH. Pt observed interacting appropriately with staff and peers on the unit. Pt compliant with medication administration per MD orders. Pt given education, support, and encouragement to be active in his treatment plan. Pt being monitored Q 15 minutes for safety per unit protocol, remains safe on the unit    

## 2022-12-06 NOTE — Plan of Care (Signed)
  Problem: Self-Concept: Goal: Level of anxiety will decrease Outcome: Progressing   Problem: Clinical Measurements: Goal: Will remain free from infection Outcome: Progressing   Problem: Nutrition: Goal: Adequate nutrition will be maintained Outcome: Progressing

## 2022-12-06 NOTE — BHH Group Notes (Signed)
[facility] Group Notes:  (Nursing/MHT/Case Management/Adjunct)  Date:  12/06/2022  Time:  8:27 PM  Type of Therapy:   Wrap up  Participation Level:  Did Not Attend  Participation Quality:   Came to Group when it was over  Summary of Progress/Problems:  Adam [name] 12/06/2022, 8:27 PM

## 2022-12-06 NOTE — Progress Notes (Signed)
Recreation Therapy Notes   Date: 12/06/2022  Time: 10:05 am     Location: Craft room   Behavioral response: Appropriate  Intervention Topic:  Time Management    Discussion/Intervention:  Group content today was focused on time management. The group defined time management and identified healthy ways to manage time. Individuals expressed how much of the 24 hours they use in a day. Patients expressed how much time they use just for themselves personally. The group expressed how they have managed their time in the past. Individuals participated in the intervention "Managing Life" where they had a chance to see how much of the 24 hours they use and where it goes. Clinical Observations/Feedback: Patient came to group and was focused on what peers and staff had to say about time management. Individual was social with staff and peers while participating in the intervention.  Nazyia Gaugh LRT/CTRS         Siegfried Vieth 12/06/2022 11:30 AM

## 2022-12-06 NOTE — Progress Notes (Signed)
Pt denies SI/HI/AVH and verbally agrees to approach staff if these become apparent or before harming themselves/others. Rates pain on faces scale 4. Scheduled medications administered to pt, per MD orders. RN provided support and encouragement to pt. Q15 min safety checks implemented and continued. Pt safe on the unit. RN will continue to monitor and intervene as needed.  12/06/22 1000  Psych Admission Type (Psych Patients Only)  Admission Status Involuntary  Psychosocial Assessment  Patient Complaints None  Eye Contact Fair  Facial Expression Animated  Affect Appropriate to circumstance  Speech Aphasic  Interaction Assertive  Motor Activity Slow  Appearance/Hygiene Unremarkable  Behavior Characteristics Cooperative;Appropriate to situation;Calm  Mood Pleasant  Aggressive Behavior  Effect No apparent injury  Thought Process  Coherency Concrete thinking  Content WDL  Delusions None reported or observed  Perception WDL  Hallucination None reported or observed  Judgment Impaired  Confusion None  Danger to Self  Current suicidal ideation? Denies  Danger to Others  Danger to Others None reported or observed  Danger to Others Abnormal  Harmful Behavior to others No threats or harm toward other people  Destructive Behavior No threats or harm toward property

## 2022-12-07 NOTE — BHH Counselor (Signed)
CSW spoke with [employer].   Mendel Ryder reports that at this time the patient has a [facility] in [facility] interested in him.  Name of the home is [facility].  Home is run by [employer].  Mendel Ryder has sent the H&P, FL2 and any notes.  Home has 24 hour supervision and a four day Day Program.  Mendel Ryder requested that CSW create a WebEx interview for [facility], CSW team and patient and the interested [facility].  CSW has followed up and created the WebEx.    Assunta Curtis, MSW, LCSW 12/07/2022 2:21 PM

## 2022-12-07 NOTE — Plan of Care (Signed)
  Problem: Self-Concept: Goal: Level of anxiety will decrease Outcome: Progressing   Problem: Education: Goal: Knowledge of General Education information will improve Description: Including pain rating scale, medication(s)/side effects and non-pharmacologic comfort measures Outcome: Progressing   Problem: Nutrition: Goal: Adequate nutrition will be maintained Outcome: Progressing   Problem: Coping: Goal: Level of anxiety will decrease Outcome: Progressing

## 2022-12-07 NOTE — Progress Notes (Signed)
[facility] MD Progress Note  12/07/2022 2:12 PM Adam [name]  MRN:  [id number] Subjective: Follow-up 62 year old man schizophrenia.  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[place])   Stroke (HCC)   HTN (hypertension)   HLD (hyperlipidemia)   CAD (coronary artery disease)   Chronic systolic CHF (congestive heart failure) (HCC)   Anxiety   Infection of left earlobe  Total Time spent with patient: 15 minutes  Past Psychiatric History: Past history of schizophrenia  Past Medical History:  Past Medical History:  Diagnosis Date   Myocardial infarction ([place])    Schizophrenia ([place])    Stroke ([place])     Past Surgical History:  Procedure Laterality Date   NO PAST SURGERIES     Family History:  Family History  Problem Relation Age of Onset   Hypertension Mother    Family Psychiatric  History: See previous Social History:  Social History   Substance and Sexual Activity  Alcohol Use Not Currently     Social History   Substance and Sexual Activity  Drug Use Not Currently    Social History   Socioeconomic History   Marital status: Single    Spouse name: Not on file   Number of children: Not on file   Years of education: Not on file   Highest education level: Not on file  Occupational History   Not on file  Tobacco Use   Smoking status: Never    Passive exposure: Never   Smokeless tobacco: Never  Vaping Use   Vaping Use: Unknown  Substance and Sexual Activity   Alcohol use: Not Currently   Drug use: Not Currently   Sexual activity: Not Currently  Other Topics Concern   Not on file  Social History Narrative   Not on file   Social Determinants of Health   Financial Resource Strain: Not on file  Food Insecurity: Not  on file  Transportation Needs: Not on file  Physical Activity: Not on file  Stress: Not on file  Social Connections: Not on file   Additional Social History:  Specify valuables returned:  (none)                      Sleep: Fair  Appetite:  Fair  Current Medications: Current Facility-Administered Medications  Medication Dose Route Frequency Provider Last Rate Last Admin   acetaminophen (TYLENOL) tablet 650 mg  650 mg Oral Q6H PRN Lora Glomski T, MD   650 mg at 12/07/22 0839   alum & mag hydroxide-simeth (MAALOX/MYLANTA) 200-200-20 MG/5ML suspension 30 mL  30 mL Oral Q4H PRN Canden Cieslinski T, MD   30 mL at 04/26/22 0932   aspirin EC tablet 81 mg  81 mg Oral Daily Xaiver Roskelley T, MD   81 mg at 12/07/22 0818   atorvastatin (LIPITOR) tablet 20 mg  20 mg Oral Daily Oletha Tolson T, MD   20 mg at 12/07/22 0819   atropine 1 % ophthalmic solution 1 drop  1 drop Sublingual QID [name], MD   1 drop at 12/07/22 1134   benztropine (COGENTIN) tablet 0.5 mg  0.5 mg Oral BID Kaiesha Tonner T, MD   0.5 mg at 12/07/22 0819   clonazePAM (KLONOPIN) tablet 1 mg  1 mg Oral TID PRN He, [name], MD   1  mg at 11/25/22 0602   cloZAPine (CLOZARIL) tablet 300 mg  300 mg Oral QHS Josie Mesa T, MD   300 mg at 12/06/22 2120   diphenhydrAMINE (BENADRYL) capsule 50 mg  50 mg Oral Q6H PRN Alexius Ellington T, MD   50 mg at 11/29/22 1005   Or   diphenhydrAMINE (BENADRYL) injection 50 mg  50 mg Intramuscular Q6H PRN Dawnette Mione T, MD       feeding supplement (ENSURE ENLIVE / ENSURE PLUS) liquid 237 mL  237 mL Oral BID BM Deanglo Hissong T, MD   237 mL at 12/07/22 [id number]   gabapentin (NEURONTIN) capsule 300 mg  300 mg Oral TID Zamyia Gowell T, MD   300 mg at 12/07/22 [id number]   haloperidol (HALDOL) tablet 2 mg  2 mg Oral TID Marcella Charlson T, MD   2 mg at 12/07/22 [id number]   haloperidol (HALDOL) tablet 5 mg  5 mg Oral Q6H PRN Clancy Leiner T, MD   5 mg at 11/21/22 2132   Or   haloperidol lactate (HALDOL) injection 5 mg  5  mg Intramuscular Q6H PRN Shantai Tiedeman T, MD       lisinopril (ZESTRIL) tablet 5 mg  5 mg Oral Daily Cedrick Partain T, MD   5 mg at 12/07/22 [id number]   magnesium hydroxide (MILK OF MAGNESIA) suspension 30 mL  30 mL Oral Daily PRN Mariangel Ringley T, MD   30 mL at 12/03/22 1526   metoprolol succinate (TOPROL-XL) 24 hr tablet 25 mg  25 mg Oral Daily Ellesse Antenucci T, MD   25 mg at 12/07/22 0823   ondansetron (ZOFRAN) tablet 4 mg  4 mg Oral Q8H PRN [name], NP   4 mg at 09/06/22 2320   paliperidone (INVEGA SUSTENNA) injection 156 mg  156 mg Intramuscular Q28 days Lita Flynn T, MD   156 mg at 11/20/22 1741   paliperidone (INVEGA) 24 hr tablet 6 mg  6 mg Oral QHS Bethzaida Boord T, MD   6 mg at 12/06/22 2120   senna-docusate (Senokot-S) tablet 2 tablet  2 tablet Oral QHS PRN [name], DO   2 tablet at 09/15/22 2110   temazepam (RESTORIL) capsule 15 mg  15 mg Oral QHS Ellory Khurana T, MD   15 mg at 12/06/22 2120   ziprasidone (GEODON) injection 20 mg  20 mg Intramuscular Q12H PRN Adebayo Ensminger, [name], MD   20 mg at 07/08/22 1453    Lab Results: No results found for this or any previous visit (from the past 48 hour(s)).  Blood Alcohol level:  Lab Results  Component Value Date   ETH <10 [id number]    Metabolic Disorder Labs: Lab Results  Component Value Date   HGBA1C 5.5 04/10/2022   MPG 111.15 04/10/2022   No results found for: "PROLACTIN" Lab Results  Component Value Date   CHOL 167 11/20/2021   TRIG 107 11/20/2021   HDL 26 (L) 11/20/2021   CHOLHDL 6.4 11/20/2021   VLDL 21 11/20/2021   LDLCALC 120 (H) 11/20/2021    Physical Findings: AIMS: Facial and Oral Movements Muscles of Facial Expression: None, normal Lips and Perioral Area: None, normal Jaw: None, normal Tongue: None, normal,Extremity Movements Upper (arms, wrists, hands, fingers): None, normal Lower (legs, knees, ankles, toes): None, normal, Trunk Movements Neck, shoulders, hips: None, normal, Overall  Severity Severity of abnormal movements (highest score from questions above): None, normal Incapacitation due to abnormal movements: None, normal Patient's awareness of abnormal movements (rate  only patient's report): No Awareness, Dental Status Current problems with teeth and/or dentures?: No Does patient usually wear dentures?: No  CIWA:    COWS:     Musculoskeletal: Strength & Muscle Tone: within normal limits Gait & Station: normal Patient leans: N/A  Psychiatric Specialty Exam:  Presentation  General Appearance:  Appropriate for Environment; Casual; Neat; Well Groomed  Eye Contact: Fair  Speech: Slow  Speech Volume: Decreased  Handedness: Left   Mood and Affect  Mood: Anxious  Affect: Congruent   Thought Process  Thought Processes: Goal Directed  Descriptions of Associations:Circumstantial  Orientation:Partial  Thought Content:Scattered  History of Schizophrenia/Schizoaffective disorder:Yes  Duration of Psychotic Symptoms:Greater than six months  Hallucinations:No data recorded Ideas of Reference:None  Suicidal Thoughts:No data recorded Homicidal Thoughts:No data recorded  Sensorium  Memory: Remote Good  Judgment: Fair  Insight: Fair   Community education officer  Concentration: Fair  Attention Span: Fair  Recall: [employer] of Knowledge: Fair  Language: Fair   Psychomotor Activity  Psychomotor Activity:No data recorded  Assets  Assets:No data recorded  Sleep  Sleep:No data recorded   Physical Exam: Physical Exam Vitals and nursing note reviewed.  Constitutional:      Appearance: Normal appearance.  HENT:     Head: Normocephalic and atraumatic.     Mouth/Throat:     Pharynx: Oropharynx is clear.  Eyes:     Pupils: Pupils are equal, round, and reactive to light.  Cardiovascular:     Rate and Rhythm: Normal rate and regular rhythm.  Pulmonary:     Effort: Pulmonary effort is normal.     Breath sounds: Normal  breath sounds.  Abdominal:     General: Abdomen is flat.     Palpations: Abdomen is soft.  Musculoskeletal:        General: Normal range of motion.  Skin:    General: Skin is warm and dry.  Neurological:     General: No focal deficit present.     Mental Status: He is alert. Mental status is at baseline.  Psychiatric:        Attention and Perception: He is inattentive.        Mood and Affect: Mood normal. Affect is blunt.        Speech: He is noncommunicative.    Review of Systems  Unable to perform ROS: Language   Blood pressure 107/80, pulse 95, temperature 97.9 F (36.6 C), temperature source Oral, resp. rate 20, height 5\' 8"  (1.727 m), weight 85.8 kg, SpO2 97 %. Body mass index is 28.76 kg/m.   Treatment Plan Summary: Plan completely stable.  No change to symptoms.  No behavior problems.  Entirely waiting on placement  Alethia Berthold, MD 12/07/2022, 2:12 PM

## 2022-12-07 NOTE — Progress Notes (Signed)
Recreation Therapy Notes  Date: 12/07/2022  Time: 10:50 am  Location: Craft room     Behavioral response: N/A   Intervention Topic: Life Planning    Discussion/Intervention: Patient refused to attend group.   Clinical Observations/Feedback:  Patient refused to attend group.    Jasminne Mealy LRT/CTRS        Elainah Rhyne 12/07/2022 12:58 PM

## 2022-12-07 NOTE — Group Note (Signed)
[facility] LCSW Group Therapy Note   Group Date: 12/07/2022 Start Time: 1300 End Time: 1400  Type of Therapy and Topic:  Group Therapy:  Feelings around Relapse and Recovery  Participation Level:  Minimal    Description of Group:    Patients in this group will discuss emotions they experience before and after a relapse. They will process how experiencing these feelings, or avoidance of experiencing them, relates to having a relapse. Facilitator will guide patients to explore emotions they have related to recovery. Patients will be encouraged to process which emotions are more powerful. They will be guided to discuss the emotional reaction significant others in their lives may have to patients' relapse or recovery. Patients will be assisted in exploring ways to respond to the emotions of others without this contributing to a relapse.  Therapeutic Goals: Patient will identify two or more emotions that lead to relapse for them:  Patient will identify two emotions that result when they relapse:  Patient will identify two emotions related to recovery:  Patient will demonstrate ability to communicate their needs through discussion and/or role plays.   Summary of Patient Progress: Patient was present for the entirety of the group process. He commented "Obama" and "treatment" during the discussion along with his name.    Therapeutic Modalities:   Cognitive Behavioral Therapy Solution-Focused Therapy Assertiveness Training Relapse Prevention Therapy   Shirl Harris, LCSW

## 2022-12-07 NOTE — Progress Notes (Signed)
Pt denies SI/HI/AVH and verbally agrees to approach staff if these become apparent or before harming themselves/others. Rates pain with face scale 4.  Scheduled medications administered to pt, per MD orders. RN provided support and encouragement to pt. Q15 min safety checks implemented and continued. Pt safe on the unit. RN will continue to monitor and intervene as needed.  12/07/22 0839  Psych Admission Type (Psych Patients Only)  Admission Status Involuntary  Psychosocial Assessment  Patient Complaints None  Eye Contact Fair  Facial Expression Animated  Affect Appropriate to circumstance  Speech Aphasic  Interaction Assertive  Motor Activity Slow  Appearance/Hygiene Unremarkable  Behavior Characteristics Cooperative;Appropriate to situation  Mood Pleasant  Aggressive Behavior  Effect No apparent injury  Thought Process  Coherency Concrete thinking  Content WDL  Delusions None reported or observed  Perception WDL  Hallucination None reported or observed  Judgment Impaired  Confusion None  Danger to Self  Current suicidal ideation? Denies  Danger to Others  Danger to Others None reported or observed  Danger to Others Abnormal  Harmful Behavior to others No threats or harm toward other people  Destructive Behavior No threats or harm toward property

## 2022-12-08 NOTE — Plan of Care (Signed)
Pt denies anxiety/depression at this time. Pt denies SI/HI/AVH or pain at this time. Pt is calm and cooperative. Pt is medication compliant. Pt provided with support and encouragement. Pt monitored q15 minutes for safety per unit policy. Plan of care ongoing.   Pt did not take morning medications at scheduled time, MD informed and aware. MD gave verbal orders when to give afternoon doses due to late administered morning medications.  Problem: Coping: Goal: Coping ability will improve Outcome: Not Progressing   Problem: Coping: Goal: Level of anxiety will decrease Outcome: Not Progressing

## 2022-12-08 NOTE — Progress Notes (Signed)
[facility] MD Progress Note  12/08/2022 12:28 PM Adam [name]  MRN:  [id number] Subjective: Follow-up 62 year old man with schizophrenia.  Patient is reported by nursing to be in "a mood" today.  He was somewhat grumpy but in no way hostile or aggressive.  He was compliant with getting his vitals taken.  No obvious reason for it.  This happens with some regularity and nursing is sometimes able to figure out what is on his mind but much of the time we just wait and it seems to clear up.  No behavior problems.  No change in health status or safety. 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)   Stroke (HCC)   HTN (hypertension)   HLD (hyperlipidemia)   CAD (coronary artery disease)   Chronic systolic CHF (congestive heart failure) (HCC)   Anxiety   Infection of left earlobe  Total Time spent with patient: 30 minutes  Past Psychiatric History: Past history of schizophrenia and stroke  Past Medical History:  Past Medical History:  Diagnosis Date   Myocardial infarction (HCC)    Schizophrenia (HCC)    Stroke (HCC)     Past Surgical History:  Procedure Laterality Date   NO PAST SURGERIES     Family History:  Family History  Problem Relation Age of Onset   Hypertension Mother    Family Psychiatric  History: See previous Social History:  Social History   Substance and Sexual Activity  Alcohol Use Not Currently     Social History   Substance and Sexual Activity  Drug Use Not Currently    Social History   Socioeconomic History   Marital status: Single    Spouse name: Not on file   Number of children: Not on file   Years of education: Not on file   Highest education level: Not on file  Occupational History   Not on file  Tobacco Use   Smoking status: Never     Passive exposure: Never   Smokeless tobacco: Never  Vaping Use   Vaping Use: Unknown  Substance and Sexual Activity   Alcohol use: Not Currently   Drug use: Not Currently   Sexual activity: Not Currently  Other Topics Concern   Not on file  Social History Narrative   Not on file   Social Determinants of Health   Financial Resource Strain: Not on file  Food Insecurity: Not on file  Transportation Needs: Not on file  Physical Activity: Not on file  Stress: Not on file  Social Connections: Not on file   Additional Social History:  Specify valuables returned:  (none)                      Sleep: Fair  Appetite:  Fair  Current Medications: Current Facility-Administered Medications  Medication Dose Route Frequency Provider Last Rate Last Admin   acetaminophen (TYLENOL) tablet 650 mg  650 mg Oral Q6H PRN Krystle Polcyn T, MD   650 mg at 12/07/22 0839   alum & mag hydroxide-simeth (MAALOX/MYLANTA) 200-200-20 MG/5ML suspension 30 mL  30 mL Oral Q4H PRN Mavi Un T, MD   30 mL at 04/26/22 0721   aspirin EC tablet 81 mg  81 mg Oral Daily Taziyah Iannuzzi T, MD   81 mg at 12/07/22 0818   atorvastatin (LIPITOR) tablet 20 mg  20 mg  Oral Daily Karis Emig, [name], MD   20 mg at 12/07/22 0819   atropine 1 % ophthalmic solution 1 drop  1 drop Sublingual QID [name], MD   1 drop at 12/07/22 2225   benztropine (COGENTIN) tablet 0.5 mg  0.5 mg Oral BID Tanyika Barros T, MD   0.5 mg at 12/07/22 1703   clonazePAM (KLONOPIN) tablet 1 mg  1 mg Oral TID PRN He, [name], MD   1 mg at 12/07/22 1859   cloZAPine (CLOZARIL) tablet 300 mg  300 mg Oral QHS Daxson Reffett T, MD   300 mg at 12/07/22 2224   diphenhydrAMINE (BENADRYL) capsule 50 mg  50 mg Oral Q6H PRN Zonia Caplin T, MD   50 mg at 12/07/22 1859   Or   diphenhydrAMINE (BENADRYL) injection 50 mg  50 mg Intramuscular Q6H PRN Anavey Coombes T, MD       feeding supplement (ENSURE ENLIVE / ENSURE PLUS) liquid 237 mL  237 mL Oral BID BM  Nelson Julson T, MD   237 mL at 12/07/22 1456   gabapentin (NEURONTIN) capsule 300 mg  300 mg Oral TID Quana Chamberlain T, MD   300 mg at 12/07/22 2100   haloperidol (HALDOL) tablet 2 mg  2 mg Oral TID Camil Hausmann T, MD   2 mg at 12/07/22 2100   haloperidol (HALDOL) tablet 5 mg  5 mg Oral Q6H PRN Sarahbeth Cashin T, MD   5 mg at 12/07/22 1859   Or   haloperidol lactate (HALDOL) injection 5 mg  5 mg Intramuscular Q6H PRN Genee Rann,  T, MD       lisinopril (ZESTRIL) tablet 5 mg  5 mg Oral Daily Aaleah Hirsch T, MD   5 mg at 12/07/22 [id number]   magnesium hydroxide (MILK OF MAGNESIA) suspension 30 mL  30 mL Oral Daily PRN Cathaleen Korol T, MD   30 mL at 12/03/22 1526   metoprolol succinate (TOPROL-XL) 24 hr tablet 25 mg  25 mg Oral Daily Terris Bodin T, MD   25 mg at 12/07/22 0823   ondansetron (ZOFRAN) tablet 4 mg  4 mg Oral Q8H PRN [name], NP   4 mg at 09/06/22 2320   paliperidone (INVEGA SUSTENNA) injection 156 mg  156 mg Intramuscular Q28 days Rhapsody Wolven T, MD   156 mg at 11/20/22 1741   paliperidone (INVEGA) 24 hr tablet 6 mg  6 mg Oral QHS Jaylynne Birkhead T, MD   6 mg at 12/07/22 2224   senna-docusate (Senokot-S) tablet 2 tablet  2 tablet Oral QHS PRN [name], DO   2 tablet at 09/15/22 2110   temazepam (RESTORIL) capsule 15 mg  15 mg Oral QHS Deari Sessler T, MD   15 mg at 12/07/22 2225   ziprasidone (GEODON) injection 20 mg  20 mg Intramuscular Q12H PRN , [name], MD   20 mg at 07/08/22 1453    Lab Results: No results found for this or any previous visit (from the past 52 hour(s)).  Blood Alcohol level:  Lab Results  Component Value Date   ETH <10 [id number]    Metabolic Disorder Labs: Lab Results  Component Value Date   HGBA1C 5.5 04/10/2022   MPG 111.15 04/10/2022   No results found for: "PROLACTIN" Lab Results  Component Value Date   CHOL 167 11/20/2021   TRIG 107 11/20/2021   HDL 26 (L) 11/20/2021   CHOLHDL 6.4 11/20/2021   VLDL 21 11/20/2021    [place]  120 (H) 11/20/2021    Physical Findings: AIMS: Facial and Oral Movements Muscles of Facial Expression: None, normal Lips and Perioral Area: None, normal Jaw: None, normal Tongue: None, normal,Extremity Movements Upper (arms, wrists, hands, fingers): None, normal Lower (legs, knees, ankles, toes): None, normal, Trunk Movements Neck, shoulders, hips: None, normal, Overall Severity Severity of abnormal movements (highest score from questions above): None, normal Incapacitation due to abnormal movements: None, normal Patient's awareness of abnormal movements (rate only patient's report): No Awareness, Dental Status Current problems with teeth and/or dentures?: No Does patient usually wear dentures?: No  CIWA:    COWS:     Musculoskeletal: Strength & Muscle Tone: within normal limits Gait & Station: normal Patient leans: N/A  Psychiatric Specialty Exam:  Presentation  General Appearance:  Appropriate for Environment; Casual; Neat; Well Groomed  Eye Contact: Fair  Speech: Slow  Speech Volume: Decreased  Handedness: Left   Mood and Affect  Mood: Anxious  Affect: Congruent   Thought Process  Thought Processes: Goal Directed  Descriptions of Associations:Circumstantial  Orientation:Partial  Thought Content:Scattered  History of Schizophrenia/Schizoaffective disorder:Yes  Duration of Psychotic Symptoms:Greater than six months  Hallucinations:No data recorded Ideas of Reference:None  Suicidal Thoughts:No data recorded Homicidal Thoughts:No data recorded  Sensorium  Memory: Remote Good  Judgment: Fair  Insight: Fair   Community education officer  Concentration: Fair  Attention Span: Fair  Recall: [employer] of Knowledge: Fair  Language: Fair   Psychomotor Activity  Psychomotor Activity:No data recorded  Assets  Assets:No data recorded  Sleep  Sleep:No data recorded   Physical Exam: Physical Exam Vitals and nursing  note reviewed.  Constitutional:      Appearance: Normal appearance.  HENT:     Head: Normocephalic and atraumatic.     Mouth/Throat:     Pharynx: Oropharynx is clear.  Eyes:     Pupils: Pupils are equal, round, and reactive to light.  Cardiovascular:     Rate and Rhythm: Normal rate and regular rhythm.  Pulmonary:     Effort: Pulmonary effort is normal.     Breath sounds: Normal breath sounds.  Abdominal:     General: Abdomen is flat.     Palpations: Abdomen is soft.  Musculoskeletal:        General: Normal range of motion.  Skin:    General: Skin is warm and dry.  Neurological:     General: No focal deficit present.     Mental Status: He is alert. Mental status is at baseline.  Psychiatric:        Attention and Perception: Attention normal.        Mood and Affect: Mood normal. Affect is blunt.        Speech: He is noncommunicative.    Review of Systems  Unable to perform ROS: Language   Blood pressure 98/77, pulse (!) 106, temperature 98.1 F (36.7 C), temperature source Oral, resp. rate 20, height 5\' 8"  (1.727 m), weight 85.8 kg, SpO2 96 %. Body mass index is 28.76 kg/m.   Treatment Plan Summary: Plan as noted above nothing really remarkable about this that would require any change in treatment.  Very stable.  Entirely focused on discharge planning.  Alethia Berthold, MD 12/08/2022, 12:28 PM

## 2022-12-08 NOTE — Progress Notes (Signed)
Patient is alert and oriented x 4, affect is blunted appears irritable not interacting appropriately with peers and staff. Patient denies SI/HI/AVH, he was offered emotional support. Patient was complaint with medication regimen no distress noted will continue to monitor.      

## 2022-12-09 LAB — CBC WITH DIFFERENTIAL/PLATELET
Abs Immature Granulocytes: 0.02 10*3/uL (ref 0.00–0.07)
Basophils Absolute: 0.1 10*3/uL (ref 0.0–0.1)
Basophils Relative: 3 %
Eosinophils Absolute: 0 10*3/uL (ref 0.0–0.5)
Eosinophils Relative: 0 %
HCT: 37.2 % — ABNORMAL LOW (ref 39.0–52.0)
Hemoglobin: 12.7 g/dL — ABNORMAL LOW (ref 13.0–17.0)
Immature Granulocytes: 0 %
Lymphocytes Relative: 24 %
Lymphs Abs: 1.1 10*3/uL (ref 0.7–4.0)
MCH: 30.7 pg (ref 26.0–34.0)
MCHC: 34.1 g/dL (ref 30.0–36.0)
MCV: 89.9 fL (ref 80.0–100.0)
Monocytes Absolute: 0.6 10*3/uL (ref 0.1–1.0)
Monocytes Relative: 14 %
Neutro Abs: 2.6 10*3/uL (ref 1.7–7.7)
Neutrophils Relative %: 59 %
Platelets: 198 10*3/uL (ref 150–400)
RBC: 4.14 MIL/uL — ABNORMAL LOW (ref 4.22–5.81)
RDW: 12.4 % (ref 11.5–15.5)
WBC: 4.5 10*3/uL (ref 4.0–10.5)
nRBC: 0 % (ref 0.0–0.2)

## 2022-12-09 NOTE — Plan of Care (Signed)
Pt denies anxiety/depression at this time. Pt denies SI/HI/AVH or pain at this time. Pt is calm and cooperative. Pt is medication compliant. Pt provided with support and encouragement. Pt monitored q15 minutes for safety per unit policy. Plan of care ongoing.   Problem: Self-Concept: Goal: Level of anxiety will decrease Outcome: Progressing   Problem: Nutrition: Goal: Adequate nutrition will be maintained Outcome: Progressing   

## 2022-12-09 NOTE — Progress Notes (Signed)
Pharmacy Consult - Clozapine     62 yo male ordered clozapine 300 mg PO daily  This patient's order has been reviewed for prescribing contraindications.    Clozapine REMS enrollment Verified: yes/no on yes DATE1/7 REMS patient ID: [id number] Current Outpatient Monitoring: Weekly    Home Regimen:  300 mg PO QHS    Dose Adjustments This Admission: N/A   Labs: Date    [place]    Submitted? 1/7 2600 Yes      Plan: RA: [id number] Continue Clozapine 300mg  daily  Monitor ANC at least weekly while inpatient

## 2022-12-09 NOTE — Progress Notes (Signed)
[facility] MD Progress Note  12/09/2022 12:16 PM Adam [name]  MRN:  [id number] Subjective: Patient seen and chart reviewed.  Nothing new.  Continues to have episodes of loudly verbalizing nonsense but is usually redirectable.  No violence or aggression.  Self-care stable.  Taking his medicine. 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)   Stroke (HCC)   HTN (hypertension)   HLD (hyperlipidemia)   CAD (coronary artery disease)   Chronic systolic CHF (congestive heart failure) (HCC)   Anxiety   Infection of left earlobe  Total Time spent with patient: 30 minutes  Past Psychiatric History: Past history of schizophrenia and stroke  Past Medical History:  Past Medical History:  Diagnosis Date   Myocardial infarction (HCC)    Schizophrenia (HCC)    Stroke (HCC)     Past Surgical History:  Procedure Laterality Date   NO PAST SURGERIES     Family History:  Family History  Problem Relation Age of Onset   Hypertension Mother    Family Psychiatric  History: See previous Social History:  Social History   Substance and Sexual Activity  Alcohol Use Not Currently     Social History   Substance and Sexual Activity  Drug Use Not Currently    Social History   Socioeconomic History   Marital status: Single    Spouse name: Not on file   Number of children: Not on file   Years of education: Not on file   Highest education level: Not on file  Occupational History   Not on file  Tobacco Use   Smoking status: Never    Passive exposure: Never   Smokeless tobacco: Never  Vaping Use   Vaping Use: Unknown  Substance and Sexual Activity   Alcohol use: Not Currently   Drug use: Not Currently   Sexual activity: Not Currently  Other Topics Concern   Not on file   Social History Narrative   Not on file   Social Determinants of Health   Financial Resource Strain: Not on file  Food Insecurity: Not on file  Transportation Needs: Not on file  Physical Activity: Not on file  Stress: Not on file  Social Connections: Not on file   Additional Social History:  Specify valuables returned:  (none)                      Sleep: Fair  Appetite:  Fair  Current Medications: Current Facility-Administered Medications  Medication Dose Route Frequency Provider Last Rate Last Admin   acetaminophen (TYLENOL) tablet 650 mg  650 mg Oral Q6H PRN Jamespaul Secrist T, MD   650 mg at 12/09/22 0909   alum & mag hydroxide-simeth (MAALOX/MYLANTA) 200-200-20 MG/5ML suspension 30 mL  30 mL Oral Q4H PRN Dejanay Wamboldt T, MD   30 mL at 04/26/22 8101   aspirin EC tablet 81 mg  81 mg Oral Daily Zorianna Taliaferro, [name], MD   81 mg at 12/09/22 0910   atorvastatin (LIPITOR) tablet 20 mg  20 mg Oral Daily Michaline Kindig T, MD   20 mg at 12/09/22 0910   atropine 1 % ophthalmic solution 1 drop  1 drop Sublingual QID [name], MD   1 drop at 12/09/22 1205   benztropine (COGENTIN) tablet 0.5 mg  0.5 mg Oral BID Noor Vidales, [name], MD   0.5  mg at 12/09/22 0910   clonazePAM (KLONOPIN) tablet 1 mg  1 mg Oral TID PRN He, [name], MD   1 mg at 12/08/22 2146   cloZAPine (CLOZARIL) tablet 300 mg  300 mg Oral QHS Buren Havey, [name], MD   300 mg at 12/08/22 2146   diphenhydrAMINE (BENADRYL) capsule 50 mg  50 mg Oral Q6H PRN Marquitta Persichetti, [name], MD   50 mg at 12/07/22 1859   Or   diphenhydrAMINE (BENADRYL) injection 50 mg  50 mg Intramuscular Q6H PRN Cornelious Diven, [name], MD       feeding supplement (ENSURE ENLIVE / ENSURE PLUS) liquid 237 mL  237 mL Oral BID BM Tessy Pawelski T, MD   237 mL at 12/09/22 0913   gabapentin (NEURONTIN) capsule 300 mg  300 mg Oral TID Seymone Forlenza, [name], MD   300 mg at 12/09/22 0910   haloperidol (HALDOL) tablet 2 mg  2 mg Oral TID Ticia Virgo, [name], MD   2 mg at 12/09/22 0910    haloperidol (HALDOL) tablet 5 mg  5 mg Oral Q6H PRN Usiel Astarita, [name], MD   5 mg at 12/07/22 1859   Or   haloperidol lactate (HALDOL) injection 5 mg  5 mg Intramuscular Q6H PRN Rio Taber, [name], MD       lisinopril (ZESTRIL) tablet 5 mg  5 mg Oral Daily Kimberlynn Lumbra, [name], MD   5 mg at 12/09/22 0910   magnesium hydroxide (MILK OF MAGNESIA) suspension 30 mL  30 mL Oral Daily PRN Dayna Alia, [name], MD   30 mL at 12/03/22 1526   metoprolol succinate (TOPROL-XL) 24 hr tablet 25 mg  25 mg Oral Daily Tajee Savant T, MD   25 mg at 12/09/22 0910   ondansetron (ZOFRAN) tablet 4 mg  4 mg Oral Q8H PRN Jearld Lesch, NP   4 mg at 09/06/22 2320   paliperidone (INVEGA SUSTENNA) injection 156 mg  156 mg Intramuscular Q28 days Drake Wuertz, [name], MD   156 mg at 11/20/22 1741   paliperidone (INVEGA) 24 hr tablet 6 mg  6 mg Oral QHS Frenchie Pribyl T, MD   6 mg at 12/08/22 2147   senna-docusate (Senokot-S) tablet 2 tablet  2 tablet Oral QHS PRN Sarina Ill, DO   2 tablet at 09/15/22 2110   temazepam (RESTORIL) capsule 15 mg  15 mg Oral QHS Layth Cerezo T, MD   15 mg at 12/08/22 2146   ziprasidone (GEODON) injection 20 mg  20 mg Intramuscular Q12H PRN Ozell Ferrera, [name], MD   20 mg at 07/08/22 1453    Lab Results:  Results for orders placed or performed during the hospital encounter of 03/19/22 (from the past 48 hour(s))  CBC with Differential/Platelet     Status: Abnormal   Collection Time: 12/09/22  9:02 AM  Result Value Ref Range   WBC 4.5 4.0 - 10.5 K/uL   RBC 4.14 (L) 4.22 - 5.81 MIL/uL   Hemoglobin 12.7 (L) 13.0 - 17.0 g/dL   HCT 10.9 (L) 32.3 - 55.7 %   MCV 89.9 80.0 - 100.0 fL   MCH 30.7 26.0 - 34.0 pg   MCHC 34.1 30.0 - 36.0 g/dL   RDW 32.2 02.5 - 42.7 %   Platelets 198 150 - 400 K/uL   nRBC 0.0 0.0 - 0.2 %   Neutrophils Relative % 59 %   Neutro Abs 2.6 1.7 - 7.7 K/uL   Lymphocytes Relative 24 %   Lymphs Abs 1.1  0.7 - 4.0 K/uL   Monocytes Relative 14 %   Monocytes Absolute 0.6 0.1 - 1.0 K/uL    Eosinophils Relative 0 %   Eosinophils Absolute 0.0 0.0 - 0.5 K/uL   Basophils Relative 3 %   Basophils Absolute 0.1 0.0 - 0.1 K/uL   Immature Granulocytes 0 %   Abs Immature Granulocytes 0.02 0.00 - 0.07 K/uL    Comment: Performed at [facility], [address] [place], [place] [address]    Blood Alcohol level:  Lab Results  Component Value Date   [facility] <10 07/20/2021    Metabolic Disorder Labs: Lab Results  Component Value Date   HGBA1C 5.5 04/10/2022   MPG 111.15 04/10/2022   No results found for: "PROLACTIN" Lab Results  Component Value Date   CHOL 167 11/20/2021   TRIG 107 11/20/2021   HDL 26 (L) 11/20/2021   CHOLHDL 6.4 11/20/2021   VLDL 21 11/20/2021   LDLCALC 120 (H) 11/20/2021    Physical Findings: AIMS: Facial and Oral Movements Muscles of Facial Expression: None, normal Lips and Perioral Area: None, normal Jaw: None, normal Tongue: None, normal,Extremity Movements Upper (arms, wrists, hands, fingers): None, normal Lower (legs, knees, ankles, toes): None, normal, Trunk Movements Neck, shoulders, hips: None, normal, Overall Severity Severity of abnormal movements (highest score from questions above): None, normal Incapacitation due to abnormal movements: None, normal Patient's awareness of abnormal movements (rate only patient's report): No Awareness, Dental Status Current problems with teeth and/or dentures?: No Does patient usually wear dentures?: No  CIWA:    COWS:     Musculoskeletal: Strength & Muscle Tone: within normal limits Gait & Station: normal Patient leans: N/A  Psychiatric Specialty Exam:  Presentation  General Appearance:  Appropriate for Environment; Casual; Neat; Well Groomed  Eye Contact: Fair  Speech: Slow  Speech Volume: Decreased  Handedness: Left   Mood and Affect  Mood: Anxious  Affect: Congruent   Thought Process  Thought Processes: Goal Directed  Descriptions of  Associations:Circumstantial  Orientation:Partial  Thought Content:Scattered  History of Schizophrenia/Schizoaffective disorder:Yes  Duration of Psychotic Symptoms:Greater than six months  Hallucinations:No data recorded Ideas of Reference:None  Suicidal Thoughts:No data recorded Homicidal Thoughts:No data recorded  Sensorium  Memory: Remote Good  Judgment: Fair  Insight: Fair   Art therapist  Concentration: Fair  Attention Span: Fair  Recall: [employer] of Knowledge: Fair  Language: Fair   Psychomotor Activity  Psychomotor Activity:No data recorded  Assets  Assets:No data recorded  Sleep  Sleep:No data recorded   Physical Exam: Physical Exam Vitals and nursing note reviewed.  Constitutional:      Appearance: Normal appearance.  HENT:     Head: Normocephalic and atraumatic.     Mouth/Throat:     Pharynx: Oropharynx is clear.  Eyes:     Pupils: Pupils are equal, round, and reactive to light.  Cardiovascular:     Rate and Rhythm: Normal rate and regular rhythm.  Pulmonary:     Effort: Pulmonary effort is normal.     Breath sounds: Normal breath sounds.  Abdominal:     General: Abdomen is flat.     Palpations: Abdomen is soft.  Musculoskeletal:        General: Normal range of motion.  Skin:    General: Skin is warm and dry.  Neurological:     General: No focal deficit present.     Mental Status: He is alert. Mental status is at baseline.  Psychiatric:  Attention and Perception: Attention normal.        Mood and Affect: Mood normal. Affect is blunt.        Speech: He is noncommunicative.    Review of Systems  Unable to perform ROS: Language   Blood pressure 106/78, pulse 100, temperature 97.9 F (36.6 C), temperature source Oral, resp. rate 20, height 5\' 8"  (1.727 m), weight 85.8 kg, SpO2 96 %. Body mass index is 28.76 kg/m.   Treatment Plan Summary: Plan no change to treatment plan.  Continue current medicine.   Plan is entirely focused on discharge planning  Alethia Berthold, MD 12/09/2022, 12:16 PM

## 2022-12-09 NOTE — Progress Notes (Signed)
Patient is alert and oriented x 4, affect is flat but brightens upon approach, he appears less irritable and interacting appropriately with peers and staff. Patient denies SI/HI/AVH, he was offered emotional support. Patient was complaint with medication regimen no distress noted will continue to monitor.     

## 2022-12-10 NOTE — Progress Notes (Signed)
Recreation Therapy Notes   Date: 12/10/2022  Time: 10:50 am     Location: Craft room   Behavioral response: Appropriate  Intervention Topic:  Coping Skills   Discussion/Intervention:  Group content on today was focused on coping skills. The group defined what coping skills are and when they normally use coping skills. Individuals described how they normally cope with thing and the coping skills they normally use. Patients expressed why it is important to cope with things and how not coping with things can affect you. The group participated in the intervention "My coping box" and made coping boxes while adding coping skills they could use in the future to the box. Clinical Observations/Feedback: Patient came to group late due to unknown reasons. Individual was social with staff and peers while participating in the intervention.  Briggitte Boline LRT/CTRS         Adam [name] 12/10/2022 1:21 PM 

## 2022-12-10 NOTE — Progress Notes (Signed)
[facility] MD Progress Note  12/10/2022 12:02 PM Adam [name]  MRN:  [id number] Subjective: Follow-up patient with schizophrenia.  No new complaints.  No behavior problems. 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)   Stroke (HCC)   HTN (hypertension)   HLD (hyperlipidemia)   CAD (coronary artery disease)   Chronic systolic CHF (congestive heart failure) (HCC)   Anxiety   Infection of left earlobe  Total Time spent with patient: 20 minutes  Past Psychiatric History: History of schizophrenia  Past Medical History:  Past Medical History:  Diagnosis Date   Myocardial infarction (HCC)    Schizophrenia (HCC)    Stroke (HCC)     Past Surgical History:  Procedure Laterality Date   NO PAST SURGERIES     Family History:  Family History  Problem Relation Age of Onset   Hypertension Mother    Family Psychiatric  History: See previous Social History:  Social History   Substance and Sexual Activity  Alcohol Use Not Currently     Social History   Substance and Sexual Activity  Drug Use Not Currently    Social History   Socioeconomic History   Marital status: Single    Spouse name: Not on file   Number of children: Not on file   Years of education: Not on file   Highest education level: Not on file  Occupational History   Not on file  Tobacco Use   Smoking status: Never    Passive exposure: Never   Smokeless tobacco: Never  Vaping Use   Vaping Use: Unknown  Substance and Sexual Activity   Alcohol use: Not Currently   Drug use: Not Currently   Sexual activity: Not Currently  Other Topics Concern   Not on file  Social History Narrative   Not on file   Social Determinants of Health   Financial Resource Strain: Not on file  Food Insecurity: Not on  file  Transportation Needs: Not on file  Physical Activity: Not on file  Stress: Not on file  Social Connections: Not on file   Additional Social History:  Specify valuables returned:  (none)                      Sleep: Fair  Appetite:  Fair  Current Medications: Current Facility-Administered Medications  Medication Dose Route Frequency Provider Last Rate Last Admin   acetaminophen (TYLENOL) tablet 650 mg  650 mg Oral Q6H PRN Donyetta Ogletree T, MD   650 mg at 12/10/22 0908   alum & mag hydroxide-simeth (MAALOX/MYLANTA) 200-200-20 MG/5ML suspension 30 mL  30 mL Oral Q4H PRN Ayman Brull T, MD   30 mL at 04/26/22 9476   aspirin EC tablet 81 mg  81 mg Oral Daily Loza Prell, [name], MD   81 mg at 12/10/22 0906   atorvastatin (LIPITOR) tablet 20 mg  20 mg Oral Daily Mayzie Caughlin T, MD   20 mg at 12/10/22 0906   atropine 1 % ophthalmic solution 1 drop  1 drop Sublingual QID [name], MD   1 drop at 12/10/22 0908   benztropine (COGENTIN) tablet 0.5 mg  0.5 mg Oral BID Zannah Melucci T, MD   0.5 mg at 12/10/22 0906   clonazePAM (KLONOPIN) tablet 1 mg  1 mg Oral TID PRN He, [name], MD   1 mg  at 12/08/22 2146   cloZAPine (CLOZARIL) tablet 300 mg  300 mg Oral QHS Charvi Gammage, [name], MD   300 mg at 12/09/22 2140   diphenhydrAMINE (BENADRYL) capsule 50 mg  50 mg Oral Q6H PRN Quoc Tome, [name], MD   50 mg at 12/07/22 1859   Or   diphenhydrAMINE (BENADRYL) injection 50 mg  50 mg Intramuscular Q6H PRN Teliyah Royal, [name], MD       feeding supplement (ENSURE ENLIVE / ENSURE PLUS) liquid 237 mL  237 mL Oral BID BM Estefanie Cornforth T, MD   237 mL at 12/09/22 1414   gabapentin (NEURONTIN) capsule 300 mg  300 mg Oral TID Raidon Swanner, [name], MD   300 mg at 12/10/22 1478   haloperidol (HALDOL) tablet 2 mg  2 mg Oral TID Rowen Hur, [name], MD   2 mg at 12/10/22 2956   haloperidol (HALDOL) tablet 5 mg  5 mg Oral Q6H PRN Breahna Boylen, [name], MD   5 mg at 12/07/22 1859   Or   haloperidol lactate (HALDOL) injection 5 mg  5 mg  Intramuscular Q6H PRN Flynt Breeze, [name], MD       lisinopril (ZESTRIL) tablet 5 mg  5 mg Oral Daily Oluwademilade Mckiver, [name], MD   5 mg at 12/10/22 2130   magnesium hydroxide (MILK OF MAGNESIA) suspension 30 mL  30 mL Oral Daily PRN Carmellia Kreisler, [name], MD   30 mL at 12/03/22 1526   metoprolol succinate (TOPROL-XL) 24 hr tablet 25 mg  25 mg Oral Daily Brenae Lasecki T, MD   25 mg at 12/10/22 0906   ondansetron (ZOFRAN) tablet 4 mg  4 mg Oral Q8H PRN Jearld Lesch, NP   4 mg at 09/06/22 2320   paliperidone (INVEGA SUSTENNA) injection 156 mg  156 mg Intramuscular Q28 days Meria Crilly, [name], MD   156 mg at 11/20/22 1741   paliperidone (INVEGA) 24 hr tablet 6 mg  6 mg Oral QHS Montell Leopard T, MD   6 mg at 12/09/22 2139   senna-docusate (Senokot-S) tablet 2 tablet  2 tablet Oral QHS PRN Sarina Ill, DO   2 tablet at 09/15/22 2110   temazepam (RESTORIL) capsule 15 mg  15 mg Oral QHS Artemis Koller T, MD   15 mg at 12/09/22 2139   ziprasidone (GEODON) injection 20 mg  20 mg Intramuscular Q12H PRN Coralyn Roselli, [name], MD   20 mg at 07/08/22 1453    Lab Results:  Results for orders placed or performed during the hospital encounter of 03/19/22 (from the past 48 hour(s))  CBC with Differential/Platelet     Status: Abnormal   Collection Time: 12/09/22  9:02 AM  Result Value Ref Range   WBC 4.5 4.0 - 10.5 K/uL   RBC 4.14 (L) 4.22 - 5.81 MIL/uL   Hemoglobin 12.7 (L) 13.0 - 17.0 g/dL   HCT 86.5 (L) 78.4 - 69.6 %   MCV 89.9 80.0 - 100.0 fL   MCH 30.7 26.0 - 34.0 pg   MCHC 34.1 30.0 - 36.0 g/dL   RDW 29.5 28.4 - 13.2 %   Platelets 198 150 - 400 K/uL   nRBC 0.0 0.0 - 0.2 %   Neutrophils Relative % 59 %   Neutro Abs 2.6 1.7 - 7.7 K/uL   Lymphocytes Relative 24 %   Lymphs Abs 1.1 0.7 - 4.0 K/uL   Monocytes Relative 14 %   Monocytes Absolute 0.6 0.1 - 1.0 K/uL   Eosinophils Relative 0 %  Eosinophils Absolute 0.0 0.0 - 0.5 K/uL   Basophils Relative 3 %   Basophils Absolute 0.1 0.0 - 0.1 K/uL   Immature  Granulocytes 0 %   Abs Immature Granulocytes 0.02 0.00 - 0.07 K/uL    Comment: Performed at [facility], [address] [place], [place] [address]    Blood Alcohol level:  Lab Results  Component Value Date   [facility] <10 07/20/2021    Metabolic Disorder Labs: Lab Results  Component Value Date   HGBA1C 5.5 04/10/2022   MPG 111.15 04/10/2022   No results found for: "PROLACTIN" Lab Results  Component Value Date   CHOL 167 11/20/2021   TRIG 107 11/20/2021   HDL 26 (L) 11/20/2021   CHOLHDL 6.4 11/20/2021   VLDL 21 11/20/2021   LDLCALC 120 (H) 11/20/2021    Physical Findings: AIMS: Facial and Oral Movements Muscles of Facial Expression: None, normal Lips and Perioral Area: None, normal Jaw: None, normal Tongue: None, normal,Extremity Movements Upper (arms, wrists, hands, fingers): None, normal Lower (legs, knees, ankles, toes): None, normal, Trunk Movements Neck, shoulders, hips: None, normal, Overall Severity Severity of abnormal movements (highest score from questions above): None, normal Incapacitation due to abnormal movements: None, normal Patient's awareness of abnormal movements (rate only patient's report): No Awareness, Dental Status Current problems with teeth and/or dentures?: No Does patient usually wear dentures?: No  CIWA:    COWS:     Musculoskeletal: Strength & Muscle Tone: within normal limits Gait & Station: normal Patient leans: N/A  Psychiatric Specialty Exam:  Presentation  General Appearance:  Appropriate for Environment; Casual; Neat; Well Groomed  Eye Contact: Fair  Speech: Slow  Speech Volume: Decreased  Handedness: Left   Mood and Affect  Mood: Anxious  Affect: Congruent   Thought Process  Thought Processes: Goal Directed  Descriptions of Associations:Circumstantial  Orientation:Partial  Thought Content:Scattered  History of Schizophrenia/Schizoaffective disorder:Yes  Duration of Psychotic  Symptoms:Greater than six months  Hallucinations:No data recorded Ideas of Reference:None  Suicidal Thoughts:No data recorded Homicidal Thoughts:No data recorded  Sensorium  Memory: Remote Good  Judgment: Fair  Insight: Fair   Art therapist  Concentration: Fair  Attention Span: Fair  Recall: Fair  Fund of Knowledge: Fair  Language: Fair   Psychomotor Activity  Psychomotor Activity:No data recorded  Assets  Assets:No data recorded  Sleep  Sleep:No data recorded   Physical Exam: Physical Exam Vitals reviewed.  Constitutional:      Appearance: Normal appearance.  HENT:     Head: Normocephalic and atraumatic.     Mouth/Throat:     Pharynx: Oropharynx is clear.  Eyes:     Pupils: Pupils are equal, round, and reactive to light.  Cardiovascular:     Rate and Rhythm: Normal rate and regular rhythm.  Pulmonary:     Effort: Pulmonary effort is normal.     Breath sounds: Normal breath sounds.  Abdominal:     General: Abdomen is flat.     Palpations: Abdomen is soft.  Musculoskeletal:        General: Normal range of motion.  Skin:    General: Skin is warm and dry.  Neurological:     General: No focal deficit present.     Mental Status: He is alert. Mental status is at baseline.  Psychiatric:        Attention and Perception: He is inattentive.        Mood and Affect: Mood normal. Affect is blunt.  Speech: He is noncommunicative.    Review of Systems  Unable to perform ROS: Language   Blood pressure 127/84, pulse 84, temperature 98.2 F (36.8 C), resp. rate 18, height 5\' 8"  (1.727 m), weight 85.8 kg, SpO2 100 %. Body mass index is 28.76 kg/m.   Treatment Plan Summary: Medication management and Plan generally doing fine no change.  This morning he wanted me to come into his room and showed me that his sheets were dirty.  Possibly looked a little bit like blood but not a great deal.  Patient said that he had thrown up or at least  answered positively when I guess that is what it was.  Nurses are following up with him.  Patient will let us know if it happens again.  We check his blood counts regularly.  I will review his medicine and make sure he is not on something likely to cause stomach erosion.  Alethia Berthold, MD 12/10/2022, 12:02 PM

## 2022-12-10 NOTE — Plan of Care (Signed)
D- Patient alert and oriented to person, place, and situation. Patient is childlike and due to a past stroke, he has expressive aphasia. Patient presented in a pleasant mood on assessment stating that he slept good last night and had complaints of right leg pain. Patient pointed to his leg and said Tylenol. Patient denied SI, HI, AVH at this time. Patient also denied any signs/symptoms of depression and anxiety, stating "no", and shaking his head no. Patient had no stated goals for today.  A- Scheduled medications administered to patient, per MD orders. Support and encouragement provided.  Routine safety checks conducted every 15 minutes.  Patient informed to notify staff with problems or concerns.  R- No adverse drug reactions noted. Patient contracts for safety at this time. Patient compliant with medications and treatment plan. Patient receptive, calm, and cooperative. Patient interacts well with others on the unit. Patient remains safe at this time.  Problem: Education: Goal: Ability to state activities that reduce stress will improve Outcome: Progressing   Problem: Coping: Goal: Ability to identify and develop effective coping behavior will improve Outcome: Progressing   Problem: Self-Concept: Goal: Ability to identify factors that promote anxiety will improve Outcome: Progressing Goal: Level of anxiety will decrease Outcome: Progressing Goal: Ability to modify response to factors that promote anxiety will improve Outcome: Progressing   Problem: Education: Goal: Knowledge of General Education information will improve Description: Including pain rating scale, medication(s)/side effects and non-pharmacologic comfort measures Outcome: Progressing   Problem: Health Behavior/Discharge Planning: Goal: Ability to manage health-related needs will improve Outcome: Progressing   Problem: Clinical Measurements: Goal: Ability to maintain clinical measurements within normal limits will  improve Outcome: Progressing Goal: Will remain free from infection Outcome: Progressing Goal: Diagnostic test results will improve Outcome: Progressing Goal: Respiratory complications will improve Outcome: Progressing Goal: Cardiovascular complication will be avoided Outcome: Progressing   Problem: Activity: Goal: Risk for activity intolerance will decrease Outcome: Progressing   Problem: Nutrition: Goal: Adequate nutrition will be maintained Outcome: Progressing   Problem: Coping: Goal: Level of anxiety will decrease Outcome: Progressing   Problem: Elimination: Goal: Will not experience complications related to bowel motility Outcome: Progressing Goal: Will not experience complications related to urinary retention Outcome: Progressing   Problem: Pain Managment: Goal: General experience of comfort will improve Outcome: Progressing   Problem: Safety: Goal: Ability to remain free from injury will improve Outcome: Progressing   Problem: Skin Integrity: Goal: Risk for impaired skin integrity will decrease Outcome: Progressing   Problem: Activity: Goal: Interest or engagement in leisure activities will improve Outcome: Progressing   Problem: Coping: Goal: Coping ability will improve Outcome: Progressing   Problem: Education: Goal: Will be free of psychotic symptoms Outcome: Progressing

## 2022-12-10 NOTE — Progress Notes (Signed)
Patient is alert and oriented x 4, affect is flat but brightens upon approach, he appears ess irritable and interacting appropriately with peers and staff. Patient denies SI/HI/AVH, he was offered emotional support. Patient was complaint with medication regimen no distress noted will continue to monitor.

## 2022-12-11 NOTE — Progress Notes (Signed)
[facility] MD Progress Note  12/11/2022 4:09 PM Adam [name]  MRN:  [id number] Subjective: Follow-up 62 year old man with schizophrenia.  No new complaint no change in behavior. 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[place])   Stroke (HCC)   HTN (hypertension)   HLD (hyperlipidemia)   CAD (coronary artery disease)   Chronic systolic CHF (congestive heart failure) (HCC)   Anxiety   Infection of left earlobe  Total Time spent with patient: 30 minutes  Past Psychiatric History: Past history of schizophrenia  Past Medical History:  Past Medical History:  Diagnosis Date   Myocardial infarction ([place])    Schizophrenia ([place])    Stroke ([place])     Past Surgical History:  Procedure Laterality Date   NO PAST SURGERIES     Family History:  Family History  Problem Relation Age of Onset   Hypertension Mother    Family Psychiatric  History: See previous Social History:  Social History   Substance and Sexual Activity  Alcohol Use Not Currently     Social History   Substance and Sexual Activity  Drug Use Not Currently    Social History   Socioeconomic History   Marital status: Single    Spouse name: Not on file   Number of children: Not on file   Years of education: Not on file   Highest education level: Not on file  Occupational History   Not on file  Tobacco Use   Smoking status: Never    Passive exposure: Never   Smokeless tobacco: Never  Vaping Use   Vaping Use: Unknown  Substance and Sexual Activity   Alcohol use: Not Currently   Drug use: Not Currently   Sexual activity: Not Currently  Other Topics Concern   Not on file  Social History Narrative   Not on file   Social Determinants of Health   Financial Resource Strain: Not on file  Food  Insecurity: Not on file  Transportation Needs: Not on file  Physical Activity: Not on file  Stress: Not on file  Social Connections: Not on file   Additional Social History:  Specify valuables returned:  (none)                      Sleep: Fair  Appetite:  Fair  Current Medications: Current Facility-Administered Medications  Medication Dose Route Frequency Provider Last Rate Last Admin   acetaminophen (TYLENOL) tablet 650 mg  650 mg Oral Q6H PRN Mabel Unrein T, MD   650 mg at 12/11/22 0833   alum & mag hydroxide-simeth (MAALOX/MYLANTA) 200-200-20 MG/5ML suspension 30 mL  30 mL Oral Q4H PRN Kalob Bergen T, MD   30 mL at 04/26/22 6659   aspirin EC tablet 81 mg  81 mg Oral Daily Sandie Swayze, [name], MD   81 mg at 12/11/22 0829   atorvastatin (LIPITOR) tablet 20 mg  20 mg Oral Daily Dalynn Jhaveri T, MD   20 mg at 12/11/22 0828   atropine 1 % ophthalmic solution 1 drop  1 drop Sublingual QID [name], MD   1 drop at 12/11/22 1249   benztropine (COGENTIN) tablet 0.5 mg  0.5 mg Oral BID Sharlon Pfohl, [name], MD   0.5 mg at 12/11/22 0828   clonazePAM (KLONOPIN) tablet 1 mg  1 mg Oral TID PRN He, [name], MD  1 mg at 12/08/22 2146   cloZAPine (CLOZARIL) tablet 300 mg  300 mg Oral QHS Lyle Niblett T, MD   300 mg at 12/10/22 2108   diphenhydrAMINE (BENADRYL) capsule 50 mg  50 mg Oral Q6H PRN Kynley Metzger T, MD   50 mg at 12/07/22 1859   Or   diphenhydrAMINE (BENADRYL) injection 50 mg  50 mg Intramuscular Q6H PRN Turkessa Ostrom T, MD       feeding supplement (ENSURE ENLIVE / ENSURE PLUS) liquid 237 mL  237 mL Oral BID BM Sandrea Boer T, MD   237 mL at 12/11/22 0845   gabapentin (NEURONTIN) capsule 300 mg  300 mg Oral TID Jerris Keltz T, MD   300 mg at 12/11/22 1249   haloperidol (HALDOL) tablet 2 mg  2 mg Oral TID Valmai Vandenberghe T, MD   2 mg at 12/11/22 1249   haloperidol (HALDOL) tablet 5 mg  5 mg Oral Q6H PRN Margarita Bobrowski T, MD   5 mg at 12/07/22 1859   Or   haloperidol lactate (HALDOL)  injection 5 mg  5 mg Intramuscular Q6H PRN Alicea Wente T, MD       lisinopril (ZESTRIL) tablet 5 mg  5 mg Oral Daily Ananya Mccleese T, MD   5 mg at 12/11/22 5277   magnesium hydroxide (MILK OF MAGNESIA) suspension 30 mL  30 mL Oral Daily PRN Kaytlynn Kochan T, MD   30 mL at 12/03/22 1526   metoprolol succinate (TOPROL-XL) 24 hr tablet 25 mg  25 mg Oral Daily Susann Lawhorne T, MD   25 mg at 12/11/22 0828   ondansetron (ZOFRAN) tablet 4 mg  4 mg Oral Q8H PRN Lerry Liner M, NP   4 mg at 09/06/22 2320   paliperidone (INVEGA SUSTENNA) injection 156 mg  156 mg Intramuscular Q28 days Nashiya Disbrow T, MD   156 mg at 11/20/22 1741   paliperidone (INVEGA) 24 hr tablet 6 mg  6 mg Oral QHS Lynda Capistran T, MD   6 mg at 12/10/22 2108   senna-docusate (Senokot-S) tablet 2 tablet  2 tablet Oral QHS PRN Sarina Ill, DO   2 tablet at 09/15/22 2110   temazepam (RESTORIL) capsule 15 mg  15 mg Oral QHS Makyiah Lie T, MD   15 mg at 12/10/22 2108   ziprasidone (GEODON) injection 20 mg  20 mg Intramuscular Q12H PRN Tobin Witucki, [name], MD   20 mg at 07/08/22 1453    Lab Results: No results found for this or any previous visit (from the past 48 hour(s)).  Blood Alcohol level:  Lab Results  Component Value Date   ETH <10 07/20/2021    Metabolic Disorder Labs: Lab Results  Component Value Date   HGBA1C 5.5 04/10/2022   MPG 111.15 04/10/2022   No results found for: "PROLACTIN" Lab Results  Component Value Date   CHOL 167 11/20/2021   TRIG 107 11/20/2021   HDL 26 (L) 11/20/2021   CHOLHDL 6.4 11/20/2021   VLDL 21 11/20/2021   LDLCALC 120 (H) 11/20/2021    Physical Findings: AIMS: Facial and Oral Movements Muscles of Facial Expression: None, normal Lips and Perioral Area: None, normal Jaw: None, normal Tongue: None, normal,Extremity Movements Upper (arms, wrists, hands, fingers): None, normal Lower (legs, knees, ankles, toes): None, normal, Trunk Movements Neck, shoulders, hips: None,  normal, Overall Severity Severity of abnormal movements (highest score from questions above): None, normal Incapacitation due to abnormal movements: None, normal Patient's awareness of abnormal movements (  rate only patient's report): No Awareness, Dental Status Current problems with teeth and/or dentures?: No Does patient usually wear dentures?: No  CIWA:    COWS:     Musculoskeletal: Strength & Muscle Tone: within normal limits Gait & Station: normal Patient leans: N/A  Psychiatric Specialty Exam:  Presentation  General Appearance:  Appropriate for Environment; Casual; Neat; Well Groomed  Eye Contact: Fair  Speech: Slow  Speech Volume: Decreased  Handedness: Left   Mood and Affect  Mood: Anxious  Affect: Congruent   Thought Process  Thought Processes: Goal Directed  Descriptions of Associations:Circumstantial  Orientation:Partial  Thought Content:Scattered  History of Schizophrenia/Schizoaffective disorder:Yes  Duration of Psychotic Symptoms:Greater than six months  Hallucinations:No data recorded Ideas of Reference:None  Suicidal Thoughts:No data recorded Homicidal Thoughts:No data recorded  Sensorium  Memory: Remote Good  Judgment: Fair  Insight: Fair   Art therapist  Concentration: Fair  Attention Span: Fair  Recall: [employer] of Knowledge: Fair  Language: Fair   Psychomotor Activity  Psychomotor Activity:No data recorded  Assets  Assets:No data recorded  Sleep  Sleep:No data recorded   Physical Exam: Physical Exam Vitals and nursing note reviewed.  Constitutional:      Appearance: Normal appearance.  HENT:     Head: Normocephalic and atraumatic.     Mouth/Throat:     Pharynx: Oropharynx is clear.  Eyes:     Pupils: Pupils are equal, round, and reactive to light.  Cardiovascular:     Rate and Rhythm: Normal rate and regular rhythm.  Pulmonary:     Effort: Pulmonary effort is normal.     Breath  sounds: Normal breath sounds.  Abdominal:     General: Abdomen is flat.     Palpations: Abdomen is soft.  Musculoskeletal:        General: Normal range of motion.  Skin:    General: Skin is warm and dry.  Neurological:     General: No focal deficit present.     Mental Status: He is alert. Mental status is at baseline.  Psychiatric:        Attention and Perception: Attention normal.        Mood and Affect: Mood normal.        Speech: Speech normal.        Behavior: Behavior normal.        Thought Content: Thought content normal.        Cognition and Memory: Cognition normal.        Judgment: Judgment normal.    Review of Systems  Unable to perform ROS: Language   Blood pressure 118/83, pulse 92, temperature 98.6 F (37 C), temperature source Oral, resp. rate 16, height 5\' 8"  (1.727 m), weight 85.8 kg, SpO2 100 %. Body mass index is 28.76 kg/m.   Treatment Plan Summary: Plan completely stable.  No change to treatment plan.  Focus on discharge  , MD 12/11/2022, 4:09 PM

## 2022-12-11 NOTE — Plan of Care (Signed)
  Problem: Coping: Goal: Ability to identify and develop effective coping behavior will improve Outcome: Progressing   Problem: Education: Goal: Knowledge of General Education information will improve Description: Including pain rating scale, medication(s)/side effects and non-pharmacologic comfort measures Outcome: Progressing   Problem: Nutrition: Goal: Adequate nutrition will be maintained Outcome: Progressing   Problem: Safety: Goal: Ability to remain free from injury will improve Outcome: Progressing

## 2022-12-11 NOTE — Group Note (Signed)
[facility] LCSW Group Therapy Note    Group Date: 12/11/2022 Start Time: 1300 End Time: 1400  Type of Therapy and Topic:  Group Therapy:  Overcoming Obstacles  Participation Level:  [facility] PARTICIPATION LEVEL: Did Not Attend  Mood:  Description of Group:   In this group patients will be encouraged to explore what they see as obstacles to their own wellness and recovery. They will be guided to discuss their thoughts, feelings, and behaviors related to these obstacles. The group will process together ways to cope with barriers, with attention given to specific choices patients can make. Each patient will be challenged to identify changes they are motivated to make in order to overcome their obstacles. This group will be process-oriented, with patients participating in exploration of their own experiences as well as giving and receiving support and challenge from other group members.  Therapeutic Goals: 1. Patient will identify personal and current obstacles as they relate to admission. 2. Patient will identify barriers that currently interfere with their wellness or overcoming obstacles.  3. Patient will identify feelings, thought process and behaviors related to these barriers. 4. Patient will identify two changes they are willing to make to overcome these obstacles:    Summary of Patient Progress   Patient declined to attend group, despite encouragement from this clinician.   Therapeutic Modalities:   Cognitive Behavioral Therapy Solution Focused Therapy Motivational Interviewing Relapse Prevention Therapy   Denijah Karrer J Litha Lamartina, LCSW 

## 2022-12-11 NOTE — Progress Notes (Signed)
Patient calm and pleasant during assessment. Pt denies SI/HI/AVH. Pt observed interacting appropriately with staff and peers on the unit. Pt compliant with medication administration per MD orders. Pt given education, support, and encouragement to be active in his treatment plan. Pt being monitored Q 15 minutes for safety per unit protocol, remains safe on the unit    

## 2022-12-11 NOTE — Progress Notes (Signed)
D- Patient alert and oriented x 2. Affect animated/mood euthymic. Denies SI/HI/ AVH. Denies pain @ present. Singing while walking in halls. A- Scheduled medications administered to patient, per MD orders. Support and encouragement provided.  Routine safety checks conducted every 15 minutes.  Patient informed to notify staff with problems or concerns. R- No adverse drug reactions noted. Patient compliant with medications and treatment plan. Patient receptive and cooperative. Patient interacts well with others. He contracts for safety and remains safe on the unit at this time.

## 2022-12-11 NOTE — Progress Notes (Signed)
Patient alert and orient, smiling most of shift. Singing, interacting appropriately with staff and peers. Learning peer names and interacting with them. Medication compliant. Denies any pain, SI, HI, AVh. Meds given for sleep with good relief.   Encouragement and support provided. Safety checks maintained. Medications given as prescribed. Pt receptive and remains safe on unit with q 15 min checks.

## 2022-12-11 NOTE — Plan of Care (Signed)
  Problem: Education: Goal: Ability to state activities that reduce stress will improve Outcome: Not Progressing   Problem: Coping: Goal: Ability to identify and develop effective coping behavior will improve Outcome: Not Progressing   Problem: Self-Concept: Goal: Ability to identify factors that promote anxiety will improve Outcome: Not Progressing Goal: Level of anxiety will decrease Outcome: Not Progressing Goal: Ability to modify response to factors that promote anxiety will improve Outcome: Not Progressing   Problem: Education: Goal: Knowledge of General Education information will improve Description: Including pain rating scale, medication(s)/side effects and non-pharmacologic comfort measures Outcome: Not Progressing   Problem: Health Behavior/Discharge Planning: Goal: Ability to manage health-related needs will improve Outcome: Not Progressing   Problem: Clinical Measurements: Goal: Ability to maintain clinical measurements within normal limits will improve Outcome: Not Progressing Goal: Will remain free from infection Outcome: Not Progressing Goal: Diagnostic test results will improve Outcome: Not Progressing Goal: Respiratory complications will improve Outcome: Not Progressing Goal: Cardiovascular complication will be avoided Outcome: Not Progressing   Problem: Activity: Goal: Risk for activity intolerance will decrease Outcome: Not Progressing   Problem: Nutrition: Goal: Adequate nutrition will be maintained Outcome: Not Progressing   Problem: Coping: Goal: Level of anxiety will decrease Outcome: Not Progressing   Problem: Elimination: Goal: Will not experience complications related to bowel motility Outcome: Not Progressing Goal: Will not experience complications related to urinary retention Outcome: Not Progressing   Problem: Pain Managment: Goal: General experience of comfort will improve Outcome: Not Progressing   Problem: Safety: Goal:  Ability to remain free from injury will improve Outcome: Not Progressing   Problem: Skin Integrity: Goal: Risk for impaired skin integrity will decrease Outcome: Not Progressing   Problem: Activity: Goal: Interest or engagement in leisure activities will improve Outcome: Not Progressing   Problem: Coping: Goal: Coping ability will improve Outcome: Not Progressing   Problem: Education: Goal: Will be free of psychotic symptoms Outcome: Not Progressing   

## 2022-12-12 NOTE — [facility] Group Notes (Signed)
[facility] Group Notes:  (Nursing/MHT/Case Management/Adjunct)  Date:  12/12/2022  Time:  9:51 PM  Type of Therapy:   Wrap up  Participation Level:  None  Participation Quality:   Didn't stay   Nehemiah Settle 12/12/2022, 9:51 PM

## 2022-12-12 NOTE — Plan of Care (Signed)
  Problem: Education: Goal: Ability to state activities that reduce stress will improve Outcome: Not Progressing   Problem: Coping: Goal: Ability to identify and develop effective coping behavior will improve Outcome: Not Progressing   Problem: Self-Concept: Goal: Ability to identify factors that promote anxiety will improve Outcome: Not Progressing Goal: Level of anxiety will decrease Outcome: Not Progressing Goal: Ability to modify response to factors that promote anxiety will improve Outcome: Not Progressing   Problem: Education: Goal: Knowledge of General Education information will improve Description: Including pain rating scale, medication(s)/side effects and non-pharmacologic comfort measures Outcome: Not Progressing   Problem: Health Behavior/Discharge Planning: Goal: Ability to manage health-related needs will improve Outcome: Not Progressing   Problem: Clinical Measurements: Goal: Ability to maintain clinical measurements within normal limits will improve Outcome: Not Progressing Goal: Will remain free from infection Outcome: Not Progressing Goal: Diagnostic test results will improve Outcome: Not Progressing Goal: Respiratory complications will improve Outcome: Not Progressing Goal: Cardiovascular complication will be avoided Outcome: Not Progressing   Problem: Activity: Goal: Risk for activity intolerance will decrease Outcome: Not Progressing   Problem: Nutrition: Goal: Adequate nutrition will be maintained Outcome: Not Progressing   Problem: Coping: Goal: Level of anxiety will decrease Outcome: Not Progressing   Problem: Elimination: Goal: Will not experience complications related to bowel motility Outcome: Not Progressing Goal: Will not experience complications related to urinary retention Outcome: Not Progressing   Problem: Pain Managment: Goal: General experience of comfort will improve Outcome: Not Progressing   Problem: Safety: Goal:  Ability to remain free from injury will improve Outcome: Not Progressing   Problem: Skin Integrity: Goal: Risk for impaired skin integrity will decrease Outcome: Not Progressing   Problem: Activity: Goal: Interest or engagement in leisure activities will improve Outcome: Not Progressing   Problem: Coping: Goal: Coping ability will improve Outcome: Not Progressing   Problem: Education: Goal: Will be free of psychotic symptoms Outcome: Not Progressing

## 2022-12-12 NOTE — Progress Notes (Signed)
Recreation Therapy Notes    Date: 12/12/2022  Time: 10:50 am     Location: Craft room   Behavioral response: Appropriate  Intervention Topic:  Time Management   Discussion/Intervention:  Group content today was focused on time management. The group defined time management and identified healthy ways to manage time. Individuals expressed how much of the 24 hours they use in a day. Patients expressed how much time they use just for themselves personally. The group expressed how they have managed their time in the past. Individuals participated in the intervention "Managing Life" where they had a chance to see how much of the 24 hours they use and where it goes. Clinical Observations/Feedback: Patient came to group but was pulled by social work for an interview.   Darnise Montag LRT/CTRS          Hassel Uphoff 12/12/2022 2:25 PM

## 2022-12-12 NOTE — Progress Notes (Signed)
[facility] MD Progress Note  12/12/2022 2:40 PM Adam [name]  MRN:  [id number] Subjective: Follow-up patient with schizophrenia.  No new complaints no change to behavior.  Not disruptive today.  Going about his business going to groups and interacting reasonably well. 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)   Stroke (HCC)   HTN (hypertension)   HLD (hyperlipidemia)   CAD (coronary artery disease)   Chronic systolic CHF (congestive heart failure) (HCC)   Anxiety   Infection of left earlobe  Total Time spent with patient: 20 minutes  Past Psychiatric History: Past history of schizophrenia and stroke  Past Medical History:  Past Medical History:  Diagnosis Date   Myocardial infarction (HCC)    Schizophrenia (HCC)    Stroke (HCC)     Past Surgical History:  Procedure Laterality Date   NO PAST SURGERIES     Family History:  Family History  Problem Relation Age of Onset   Hypertension Mother    Family Psychiatric  History: See previous Social History:  Social History   Substance and Sexual Activity  Alcohol Use Not Currently     Social History   Substance and Sexual Activity  Drug Use Not Currently    Social History   Socioeconomic History   Marital status: Single    Spouse name: Not on file   Number of children: Not on file   Years of education: Not on file   Highest education level: Not on file  Occupational History   Not on file  Tobacco Use   Smoking status: Never    Passive exposure: Never   Smokeless tobacco: Never  Vaping Use   Vaping Use: Unknown  Substance and Sexual Activity   Alcohol use: Not Currently   Drug use: Not Currently   Sexual activity: Not Currently  Other Topics Concern   Not on file  Social History Narrative    Not on file   Social Determinants of Health   Financial Resource Strain: Not on file  Food Insecurity: Not on file  Transportation Needs: Not on file  Physical Activity: Not on file  Stress: Not on file  Social Connections: Not on file   Additional Social History:  Specify valuables returned:  (none)                      Sleep: Fair  Appetite:  Fair  Current Medications: Current Facility-Administered Medications  Medication Dose Route Frequency Provider Last Rate Last Admin   acetaminophen (TYLENOL) tablet 650 mg  650 mg Oral Q6H PRN Anacaren Kohan T, MD   650 mg at 12/12/22 0635   alum & mag hydroxide-simeth (MAALOX/MYLANTA) 200-200-20 MG/5ML suspension 30 mL  30 mL Oral Q4H PRN Yeudiel Mateo T, MD   30 mL at 04/26/22 4098   aspirin EC tablet 81 mg  81 mg Oral Daily Ly Wass, [name], MD   81 mg at 12/12/22 1191   atorvastatin (LIPITOR) tablet 20 mg  20 mg Oral Daily Graydon Fofana T, MD   20 mg at 12/12/22 4782   atropine 1 % ophthalmic solution 1 drop  1 drop Sublingual QID [name], MD   1 drop at 12/12/22 1039   benztropine (COGENTIN) tablet 0.5 mg  0.5 mg Oral BID Bevan Disney, [name], MD   0.5 mg at 12/12/22 [phone]  clonazePAM (KLONOPIN) tablet 1 mg  1 mg Oral TID PRN He, [name], MD   1 mg at 12/08/22 2146   cloZAPine (CLOZARIL) tablet 300 mg  300 mg Oral QHS Basilio Meadow T, MD   300 mg at 12/11/22 2110   diphenhydrAMINE (BENADRYL) capsule 50 mg  50 mg Oral Q6H PRN Laterra Lubinski T, MD   50 mg at 12/07/22 1859   Or   diphenhydrAMINE (BENADRYL) injection 50 mg  50 mg Intramuscular Q6H PRN Juanita Devincent T, MD       feeding supplement (ENSURE ENLIVE / ENSURE PLUS) liquid 237 mL  237 mL Oral BID BM Drezden Seitzinger T, MD   237 mL at 12/12/22 1257   gabapentin (NEURONTIN) capsule 300 mg  300 mg Oral TID Myah Guynes T, MD   300 mg at 12/12/22 1039   haloperidol (HALDOL) tablet 2 mg  2 mg Oral TID Janith Nielson T, MD   2 mg at 12/12/22 1038   haloperidol (HALDOL) tablet 5 mg  5 mg  Oral Q6H PRN Genia Perin T, MD   5 mg at 12/07/22 1859   Or   haloperidol lactate (HALDOL) injection 5 mg  5 mg Intramuscular Q6H PRN Genesi Stefanko T, MD       lisinopril (ZESTRIL) tablet 5 mg  5 mg Oral Daily Maylen Waltermire T, MD   5 mg at 12/12/22 0109   magnesium hydroxide (MILK OF MAGNESIA) suspension 30 mL  30 mL Oral Daily PRN Marilouise Densmore T, MD   30 mL at 12/03/22 1526   metoprolol succinate (TOPROL-XL) 24 hr tablet 25 mg  25 mg Oral Daily Elizer Bostic T, MD   25 mg at 12/12/22 0822   ondansetron (ZOFRAN) tablet 4 mg  4 mg Oral Q8H PRN [name], NP   4 mg at 09/06/22 2320   paliperidone (INVEGA SUSTENNA) injection 156 mg  156 mg Intramuscular Q28 days Fontaine Kossman T, MD   156 mg at 11/20/22 1741   paliperidone (INVEGA) 24 hr tablet 6 mg  6 mg Oral QHS Carrie Usery T, MD   6 mg at 12/11/22 2110   senna-docusate (Senokot-S) tablet 2 tablet  2 tablet Oral QHS PRN [name], DO   2 tablet at 09/15/22 2110   temazepam (RESTORIL) capsule 15 mg  15 mg Oral QHS Bettyjo Lundblad T, MD   15 mg at 12/11/22 2110   ziprasidone (GEODON) injection 20 mg  20 mg Intramuscular Q12H PRN Aidden Markovic, [name], MD   20 mg at 07/08/22 1453    Lab Results: No results found for this or any previous visit (from the past 48 hour(s)).  Blood Alcohol level:  Lab Results  Component Value Date   ETH <10 32/35/5732    Metabolic Disorder Labs: Lab Results  Component Value Date   HGBA1C 5.5 04/10/2022   MPG 111.15 04/10/2022   No results found for: "PROLACTIN" Lab Results  Component Value Date   CHOL 167 11/20/2021   TRIG 107 11/20/2021   HDL 26 (L) 11/20/2021   CHOLHDL 6.4 11/20/2021   VLDL 21 11/20/2021   LDLCALC 120 (H) 11/20/2021    Physical Findings: AIMS: Facial and Oral Movements Muscles of Facial Expression: None, normal Lips and Perioral Area: None, normal Jaw: None, normal Tongue: None, normal,Extremity Movements Upper (arms, wrists, hands, fingers): None, normal Lower  (legs, knees, ankles, toes): None, normal, Trunk Movements Neck, shoulders, hips: None, normal, Overall Severity Severity of abnormal movements (highest score from  questions above): None, normal Incapacitation due to abnormal movements: None, normal Patient's awareness of abnormal movements (rate only patient's report): No Awareness, Dental Status Current problems with teeth and/or dentures?: No Does patient usually wear dentures?: No  CIWA:    COWS:     Musculoskeletal: Strength & Muscle Tone: within normal limits Gait & Station: normal Patient leans: N/A  Psychiatric Specialty Exam:  Presentation  General Appearance:  Appropriate for Environment; Casual; Neat; Well Groomed  Eye Contact: Fair  Speech: Slow  Speech Volume: Decreased  Handedness: Left   Mood and Affect  Mood: Anxious  Affect: Congruent   Thought Process  Thought Processes: Goal Directed  Descriptions of Associations:Circumstantial  Orientation:Partial  Thought Content:Scattered  History of Schizophrenia/Schizoaffective disorder:Yes  Duration of Psychotic Symptoms:Greater than six months  Hallucinations:No data recorded Ideas of Reference:None  Suicidal Thoughts:No data recorded Homicidal Thoughts:No data recorded  Sensorium  Memory: Remote Good  Judgment: Fair  Insight: Fair   Community education officer  Concentration: Fair  Attention Span: Fair  Recall: [place] of Knowledge: Fair  Language: Fair   Psychomotor Activity  Psychomotor Activity:No data recorded  Assets  Assets:No data recorded  Sleep  Sleep:No data recorded   Physical Exam: Physical Exam Vitals reviewed.  Constitutional:      Appearance: Normal appearance.  HENT:     Head: Normocephalic and atraumatic.     Mouth/Throat:     Pharynx: Oropharynx is clear.  Eyes:     Pupils: Pupils are equal, round, and reactive to light.  Cardiovascular:     Rate and Rhythm: Normal rate and regular  rhythm.  Pulmonary:     Effort: Pulmonary effort is normal.     Breath sounds: Normal breath sounds.  Abdominal:     General: Abdomen is flat.     Palpations: Abdomen is soft.  Musculoskeletal:        General: Normal range of motion.  Skin:    General: Skin is warm and dry.  Neurological:     General: No focal deficit present.     Mental Status: He is alert. Mental status is at baseline.  Psychiatric:        Attention and Perception: He is inattentive.        Mood and Affect: Affect is blunt.        Speech: He is noncommunicative.    Review of Systems  Unable to perform ROS: Language   Blood pressure (!) 147/95, pulse 87, temperature 98.1 F (36.7 C), temperature source Oral, resp. rate 16, height 5\' 8"  (1.727 m), weight 85.8 kg, SpO2 100 %. Body mass index is 28.76 kg/m.   Treatment Plan Summary: Plan no change to medication or treatment plan.  Continue focusing on discharge planning.  Alethia Berthold, MD 12/12/2022, 2:40 PM

## 2022-12-12 NOTE — Group Note (Signed)
[facility] LCSW Group Therapy Note   Group Date: 12/12/2022 Start Time: 1300 End Time: 1400   Type of Therapy/Topic:  Group Therapy:  Emotion Regulation  Participation Level:  Minimal    Description of Group:    The purpose of this group is to assist patients in learning to regulate negative emotions and experience positive emotions. Patients will be guided to discuss ways in which they have been vulnerable to their negative emotions. These vulnerabilities will be juxtaposed with experiences of positive emotions or situations, and patients challenged to use positive emotions to combat negative ones. Special emphasis will be placed on coping with negative emotions in conflict situations, and patients will process healthy conflict resolution skills.  Therapeutic Goals: Patient will identify two positive emotions or experiences to reflect on in order to balance out negative emotions:  Patient will label two or more emotions that they find the most difficult to experience:  Patient will be able to demonstrate positive conflict resolution skills through discussion or role plays:   Summary of Patient Progress: Pt was present for the majority of the group process. He contributed "Madonna" and "Earleen Newport" to the conversation. Pt left group 20 minutes early.     Therapeutic Modalities:   Cognitive Behavioral Therapy Feelings Identification Dialectical Behavioral Therapy   Shirl Harris, LCSW

## 2022-12-12 NOTE — [facility] Counselor (Addendum)
CSW met with [facility] care coordinators (K. Baldwin-Soles, L. Oxendine, and C. Tommie Raymond) and leadership (Elder Cyphers, Y. Abby Potash, and Z. Brooks) regarding placement updates.   As it stands, pt has some prospective places interested in him for placement ([place]). Oxendine gave a general overview of [facility] for entirety of group. [facility] is located in [place], [place]. They currently have one male bed open at their six-bed facility. Clients are under 24-hour supervision, receive medication management, and participate in a day program on Tuesdays, Thursdays, and Friday.  Oxendine and CSW discussed WebEx meeting with [facility] today at 57 which had been scheduled last week. CSW updated everyone that this is pt's 267th day on this unit without incident. No other concerns expressed. Contact ended without incident.   CSW met with pt to assist with completion of WebEx interview. Interview with [facility] did not take place and CSW contacted Oxendine regarding this. Oxendine to follow up with [facility].   CSW will continue to follow.   [name]. Guerry Bruin, MSW, LCSW, [place] 12/12/2022 11:27 AM  Addendum Prior to meeting with [facility] and Leadership, CSW phoned Allegra Lai at [facility] [phone]) to follow up regarding his request to see pt today. Sabra Heck stated that he would give the CSW a call back regarding when this would happen. No other concerns expressed. Contact ended without incident.   [name]. Guerry Bruin, MSW, [place], [place] 12/12/2022 11:34 AM

## 2022-12-12 NOTE — Progress Notes (Signed)
D- Patient alert and oriented. Affect/mood. Denies SI, HI, AVH, and pain. Quotes. Goal. How did they do with achieving previous goal. A- Scheduled medications administered to patient, per MD orders. Support and encouragement provided.  Routine safety checks conducted every 15 minutes.  Patient informed to notify staff with problems or concerns. R- No adverse drug reactions noted. Patient contracts for safety at this time. Patient compliant with medications and treatment plan. Patient receptive, calm, and cooperative. Patient interacts well with others on the unit.  Patient remains safe at this time. 

## 2022-12-13 NOTE — Progress Notes (Signed)
Patient calm and pleasant during assessment. Pt denies SI/HI/AVH. Pt observed interacting appropriately with staff and peers on the unit. Pt compliant with medication administration per MD orders. Pt given education, support, and encouragement to be active in his treatment plan. Pt being monitored Q 15 minutes for safety per unit protocol, remains safe on the unit    

## 2022-12-13 NOTE — [facility] Counselor (Signed)
CSW sat with pt while meeting with [place] [employer]) and Loann Quill (owner). Pt was assessed regarding need for physical assistance and chronic health concerns (high blood pressure and diabetes). Assessment completed without incident. No other concerns expressed.  [facility] will be following up with CSW team regarding decision.  [name]. Guerry Bruin, MSW, [place], [place] 12/13/2022 4:08 PM

## 2022-12-13 NOTE — Group Note (Signed)
LCSW Group Therapy Note  Group Date: 12/13/2022 Start Time: 1300 End Time: 1400   Type of Therapy and Topic:  Group Therapy - Healthy vs Unhealthy Coping Skills  Participation Level:  None   Description of Group The focus of this group was to determine what unhealthy coping techniques typically are used by group members and what healthy coping techniques would be helpful in coping with various problems. Patients were guided in becoming aware of the differences between healthy and unhealthy coping techniques. Patients were asked to identify 2-3 healthy coping skills they would like to learn to use more effectively.  Therapeutic Goals Patients learned that coping is what human beings do all day long to deal with various situations in their lives Patients defined and discussed healthy vs unhealthy coping techniques Patients identified their preferred coping techniques and identified whether these were healthy or unhealthy Patients determined 2-3 healthy coping skills they would like to become more familiar with and use more often. Patients provided support and ideas to each other   Summary of Patient Progress:   Patient was present for entirety of group session. Patient unable to verbally participate in group discussion. Patient repeatedly states "Leeroy Bock" w/ out context.   Therapeutic Modalities Cognitive Behavioral Therapy Motivational Interviewing  [name] 12/13/2022  2:24 PM

## 2022-12-13 NOTE — Progress Notes (Signed)
[facility] MD Progress Note  12/13/2022 3:55 PM Adam [name]  MRN:  [id number] Subjective: Follow-up for this 62 year old man with schizophrenia.  Patient has no new complaints.  No change to behavior.  Nothing new in the whole situation. 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[place])   Stroke (HCC)   HTN (hypertension)   HLD (hyperlipidemia)   CAD (coronary artery disease)   Chronic systolic CHF (congestive heart failure) (HCC)   Anxiety   Infection of left earlobe  Total Time spent with patient: 30 minutes  Past Psychiatric History: Past history of schizophrenia  Past Medical History:  Past Medical History:  Diagnosis Date   Myocardial infarction ([place])    Schizophrenia ([place])    Stroke ([place])     Past Surgical History:  Procedure Laterality Date   NO PAST SURGERIES     Family History:  Family History  Problem Relation Age of Onset   Hypertension Mother    Family Psychiatric  History: See previous Social History:  Social History   Substance and Sexual Activity  Alcohol Use Not Currently     Social History   Substance and Sexual Activity  Drug Use Not Currently    Social History   Socioeconomic History   Marital status: Single    Spouse name: Not on file   Number of children: Not on file   Years of education: Not on file   Highest education level: Not on file  Occupational History   Not on file  Tobacco Use   Smoking status: Never    Passive exposure: Never   Smokeless tobacco: Never  Vaping Use   Vaping Use: Unknown  Substance and Sexual Activity   Alcohol use: Not Currently   Drug use: Not Currently   Sexual activity: Not Currently  Other Topics Concern   Not on file  Social History Narrative   Not on file   Social Determinants of  Health   Financial Resource Strain: Not on file  Food Insecurity: Not on file  Transportation Needs: Not on file  Physical Activity: Not on file  Stress: Not on file  Social Connections: Not on file   Additional Social History:  Specify valuables returned:  (none)                      Sleep: Fair  Appetite:  Fair  Current Medications: Current Facility-Administered Medications  Medication Dose Route Frequency Provider Last Rate Last Admin   acetaminophen (TYLENOL) tablet 650 mg  650 mg Oral Q6H PRN Wayden Schwertner T, MD   650 mg at 12/13/22 0905   alum & mag hydroxide-simeth (MAALOX/MYLANTA) 200-200-20 MG/5ML suspension 30 mL  30 mL Oral Q4H PRN Merlean Pizzini T, MD   30 mL at 04/26/22 4259   aspirin EC tablet 81 mg  81 mg Oral Daily Tahlia Deamer, [name], MD   81 mg at 12/13/22 0903   atorvastatin (LIPITOR) tablet 20 mg  20 mg Oral Daily Chasiti Waddington T, MD   20 mg at 12/13/22 0903   atropine 1 % ophthalmic solution 1 drop  1 drop Sublingual QID [name], MD   1 drop at 12/13/22 1126   benztropine (COGENTIN) tablet 0.5 mg  0.5 mg Oral BID Bowman Higbie T, MD   0.5 mg at 12/13/22 0903   clonazePAM (KLONOPIN) tablet 1  mg  1 mg Oral TID PRN He, [name], MD   1 mg at 12/08/22 2146   cloZAPine (CLOZARIL) tablet 300 mg  300 mg Oral QHS Breylin Dom T, MD   300 mg at 12/12/22 2130   diphenhydrAMINE (BENADRYL) capsule 50 mg  50 mg Oral Q6H PRN Pansy Ostrovsky T, MD   50 mg at 12/07/22 1859   Or   diphenhydrAMINE (BENADRYL) injection 50 mg  50 mg Intramuscular Q6H PRN Gini Caputo T, MD       feeding supplement (ENSURE ENLIVE / ENSURE PLUS) liquid 237 mL  237 mL Oral BID BM Kristian Hazzard T, MD   237 mL at 12/13/22 1356   gabapentin (NEURONTIN) capsule 300 mg  300 mg Oral TID Emmery Seiler T, MD   300 mg at 12/13/22 1440   haloperidol (HALDOL) tablet 2 mg  2 mg Oral TID Roberto Hlavaty T, MD   2 mg at 12/13/22 1440   haloperidol (HALDOL) tablet 5 mg  5 mg Oral Q6H PRN Corisa Montini T, MD   5 mg  at 12/07/22 1859   Or   haloperidol lactate (HALDOL) injection 5 mg  5 mg Intramuscular Q6H PRN Denelda Akerley T, MD       lisinopril (ZESTRIL) tablet 5 mg  5 mg Oral Daily Alen Matheson T, MD   5 mg at 12/13/22 7782   magnesium hydroxide (MILK OF MAGNESIA) suspension 30 mL  30 mL Oral Daily PRN Derrika Ruffalo T, MD   30 mL at 12/03/22 1526   metoprolol succinate (TOPROL-XL) 24 hr tablet 25 mg  25 mg Oral Daily Tonica Brasington T, MD   25 mg at 12/13/22 0903   ondansetron (ZOFRAN) tablet 4 mg  4 mg Oral Q8H PRN Lerry Liner M, NP   4 mg at 09/06/22 2320   paliperidone (INVEGA SUSTENNA) injection 156 mg  156 mg Intramuscular Q28 days Latessa Tillis T, MD   156 mg at 11/20/22 1741   paliperidone (INVEGA) 24 hr tablet 6 mg  6 mg Oral QHS Sammantha Mehlhaff T, MD   6 mg at 12/12/22 2130   senna-docusate (Senokot-S) tablet 2 tablet  2 tablet Oral QHS PRN Sarina Ill, DO   2 tablet at 09/15/22 2110   temazepam (RESTORIL) capsule 15 mg  15 mg Oral QHS Zennie Ayars T, MD   15 mg at 12/12/22 2134   ziprasidone (GEODON) injection 20 mg  20 mg Intramuscular Q12H PRN Qamar Aughenbaugh, [name], MD   20 mg at 07/08/22 1453    Lab Results: No results found for this or any previous visit (from the past 48 hour(s)).  Blood Alcohol level:  Lab Results  Component Value Date   ETH <10 07/20/2021    Metabolic Disorder Labs: Lab Results  Component Value Date   HGBA1C 5.5 04/10/2022   MPG 111.15 04/10/2022   No results found for: "PROLACTIN" Lab Results  Component Value Date   CHOL 167 11/20/2021   TRIG 107 11/20/2021   HDL 26 (L) 11/20/2021   CHOLHDL 6.4 11/20/2021   VLDL 21 11/20/2021   LDLCALC 120 (H) 11/20/2021    Physical Findings: AIMS: Facial and Oral Movements Muscles of Facial Expression: None, normal Lips and Perioral Area: None, normal Jaw: None, normal Tongue: None, normal,Extremity Movements Upper (arms, wrists, hands, fingers): None, normal Lower (legs, knees, ankles, toes): None,  normal, Trunk Movements Neck, shoulders, hips: None, normal, Overall Severity Severity of abnormal movements (highest score from questions above): None, normal  Incapacitation due to abnormal movements: None, normal Patient's awareness of abnormal movements (rate only patient's report): No Awareness, Dental Status Current problems with teeth and/or dentures?: No Does patient usually wear dentures?: No  CIWA:    COWS:     Musculoskeletal: Strength & Muscle Tone: within normal limits Gait & Station: normal Patient leans: N/A  Psychiatric Specialty Exam:  Presentation  General Appearance:  Appropriate for Environment; Casual; Neat; Well Groomed  Eye Contact: Fair  Speech: Slow  Speech Volume: Decreased  Handedness: Left   Mood and Affect  Mood: Anxious  Affect: Congruent   Thought Process  Thought Processes: Goal Directed  Descriptions of Associations:Circumstantial  Orientation:Partial  Thought Content:Scattered  History of Schizophrenia/Schizoaffective disorder:Yes  Duration of Psychotic Symptoms:Greater than six months  Hallucinations:No data recorded Ideas of Reference:None  Suicidal Thoughts:No data recorded Homicidal Thoughts:No data recorded  Sensorium  Memory: Remote Good  Judgment: Fair  Insight: Fair   Community education officer  Concentration: Fair  Attention Span: Fair  Recall: [employer] of Knowledge: Fair  Language: Fair   Psychomotor Activity  Psychomotor Activity:No data recorded  Assets  Assets:No data recorded  Sleep  Sleep:No data recorded   Physical Exam: Physical Exam Vitals and nursing note reviewed.  Constitutional:      Appearance: Normal appearance.  HENT:     Head: Normocephalic and atraumatic.     Mouth/Throat:     Pharynx: Oropharynx is clear.  Eyes:     Pupils: Pupils are equal, round, and reactive to light.  Cardiovascular:     Rate and Rhythm: Normal rate and regular rhythm.   Pulmonary:     Effort: Pulmonary effort is normal.     Breath sounds: Normal breath sounds.  Abdominal:     General: Abdomen is flat.     Palpations: Abdomen is soft.  Musculoskeletal:        General: Normal range of motion.  Skin:    General: Skin is warm and dry.  Neurological:     General: No focal deficit present.     Mental Status: He is alert. Mental status is at baseline.  Psychiatric:        Attention and Perception: He is inattentive.        Mood and Affect: Mood normal. Affect is blunt.        Speech: He is noncommunicative.    Review of Systems  Unable to perform ROS: Language   Blood pressure 100/82, pulse (!) 104, temperature 98.1 F (36.7 C), temperature source Oral, resp. rate 16, height 5\' 8"  (1.727 m), weight 85.8 kg, SpO2 100 %. Body mass index is 28.76 kg/m.   Treatment Plan Summary: Medication management and Plan no change to medication management or overall management.  Not clear to me if he has had any of his interviews recently or what the outcome has been.  Still waiting for replies.  Still focusing on discharge planning  Alethia Berthold, MD 12/13/2022, 3:55 PM

## 2022-12-13 NOTE — Progress Notes (Signed)
[facility] LCSW Progress Note   12/13/2022    4:13 PM  Adam [name]   [id number]   Type of Contact and Topic:  Group Home Interview   CSW facilitated interview between patient and [facility] owners Tim and Lattie Haw. Shelly Coss of [place] was also present for meeting. CSW discussed patient's mobility, compliance with medication, ADLs, among other concerns of group home.   Meeting ended without incident.   Situation ongoing, CSW will continue to monitor and update note as more information becomes available.     Signed:  [name], MSW, LCSWA, LCAS 12/13/2022 4:13 PM

## 2022-12-13 NOTE — Progress Notes (Signed)
Pt denies SI/HI/AVH and verbally agrees to approach staff if these become apparent or before harming themselves/others. Rates pain 4 on the face scale. Scheduled medications administered to pt, per MD orders. RN provided support and encouragement to pt. Q15 min safety checks implemented and continued. Pt safe on the unit. RN will continue to monitor and intervene as needed.  12/13/22 0905  Psych Admission Type (Psych Patients Only)  Admission Status Involuntary  Psychosocial Assessment  Patient Complaints None  Eye Contact Fair  Facial Expression Animated  Affect Anxious;Appropriate to circumstance  Speech Aphasic  Interaction Childlike  Motor Activity Slow  Appearance/Hygiene Unremarkable  Behavior Characteristics Cooperative;Appropriate to situation;Calm  Mood Pleasant  Aggressive Behavior  Effect No apparent injury  Thought Process  Coherency Circumstantial  Content UTA  Delusions None reported or observed  Perception UTA  Hallucination UTA  Judgment Impaired  Confusion UTA  Danger to Self  Current suicidal ideation? Denies  Danger to Others  Danger to Others None reported or observed  Danger to Others Abnormal  Harmful Behavior to others No threats or harm toward other people  Destructive Behavior No threats or harm toward property

## 2022-12-13 NOTE — [facility] Group Notes (Signed)
[facility] Group Notes:  (Nursing/MHT/Case Management/Adjunct)  Date:  12/13/2022  Time:  3:09 PM  Type of Therapy:  Psychoeducational Skills  Participation Level:  Active  Participation Quality:  Appropriate  Affect:  Appropriate  Cognitive:  Appropriate  Insight:  Appropriate  Engagement in Group:  Engaged  Modes of Intervention:  Socialization  Summary of Progress/Problems:  Adam [name] [name] 12/13/2022, 3:09 PM

## 2022-12-13 NOTE — Plan of Care (Signed)
  Problem: Self-Concept: Goal: Level of anxiety will decrease Outcome: Progressing   Problem: Education: Goal: Knowledge of General Education information will improve Description: Including pain rating scale, medication(s)/side effects and non-pharmacologic comfort measures Outcome: Progressing   Problem: Health Behavior/Discharge Planning: Goal: Ability to manage health-related needs will improve Outcome: Progressing

## 2022-12-13 NOTE — Progress Notes (Signed)
Recreation Therapy Notes    Date: 12/13/2022  Time: 10:50 am  Location: Craft room     Behavioral response: N/A   Intervention Topic: Self-care   Discussion/Intervention: Patient refused to attend group.   Clinical Observations/Feedback:  Patient refused to attend group.    Chancie Lampert LRT/CTRS         Kateleen Encarnacion 12/13/2022 12:37 PM 

## 2022-12-14 NOTE — Plan of Care (Signed)
Pt is calm and cooperative at times; occasionally labile with loud vocal expressions; denies SI HI AVH, no complaints of physical distress.  Compliant with medications.  Continue to monitor via q 15 minute checks.

## 2022-12-14 NOTE — Plan of Care (Signed)
  Problem: Coping: Goal: Ability to identify and develop effective coping behavior will improve Outcome: Not Progressing   Problem: Self-Concept: Goal: Level of anxiety will decrease Outcome: Not Progressing   Problem: Education: Goal: Knowledge of General Education information will improve Description: Including pain rating scale, medication(s)/side effects and non-pharmacologic comfort measures Outcome: Not Progressing

## 2022-12-14 NOTE — Progress Notes (Signed)
Recreation Therapy Notes  Date: 12/14/2022  Time: 9:50 am     Location: Craft room   Behavioral response: Appropriate  Intervention Topic:  Problems Solving   Discussion/Intervention:  Group content on today was focused on problem solving. The group described what problem solving is. Patients expressed how problems affect them and how they deal with problems. Individuals identified healthy ways to deal with problems. Patients explained what normally happens to them when they do not deal with problems. The group expressed reoccurring problems for them. The group participated in the intervention "Ways to Solve problems" where patients were given a chance to explore different ways to solve problems.  Clinical Observations/Feedback: Patient came to group and was focused on what peers and staff had to say about problem solving. Individual was social with staff and peers while participating in the intervention.  Mick Tanguma LRT/CTRS         Taniesha Glanz 12/14/2022 1:23 PM

## 2022-12-14 NOTE — Progress Notes (Signed)
MHT informed this Probation officer that patient refused evening vitals.

## 2022-12-14 NOTE — Group Note (Signed)
[facility] LCSW Group Therapy Note   Group Date: 12/14/2022 Start Time: 6712 End Time: 1415   Type of Therapy/Topic:  Group Therapy:  Emotion Regulation  Participation Level:  Minimal   Mood:  Description of Group:    The purpose of this group is to assist patients in learning to regulate negative emotions and experience positive emotions. Patients will be guided to discuss ways in which they have been vulnerable to their negative emotions. These vulnerabilities will be juxtaposed with experiences of positive emotions or situations, and patients challenged to use positive emotions to combat negative ones. Special emphasis will be placed on coping with negative emotions in conflict situations, and patients will process healthy conflict resolution skills.  Therapeutic Goals: Patient will identify two positive emotions or experiences to reflect on in order to balance out negative emotions:  Patient will label two or more emotions that they find the most difficult to experience:  Patient will be able to demonstrate positive conflict resolution skills through discussion or role plays:   Summary of Patient Progress: Patient was present in group.  Patient was awake and appeared attentive.  Patient contributed "[place]" when CSW spoke about prayer.  Patient contributed "Tsosie Billing" when CSW discusses grad school.  Therapeutic Modalities:   Cognitive Behavioral Therapy Feelings Identification Dialectical Behavioral Therapy   Rozann Lesches, LCSW

## 2022-12-14 NOTE — Progress Notes (Signed)
[facility] MD Progress Note  12/14/2022 11:14 AM Adam [name]  MRN:  [id number] Subjective: Follow-up patient with schizophrenia.  Adam [name] was in a bit of a grumpy mood this morning.  Shouting about "[employer] and looking angry.  Not physically hostile not striking out at anyone.  Redirectable.  Staff discussed this and it was not at all clear what he might really be bothered by.  He did not seem to be in any physical distress and no one had identified any interpersonal conflict that had occurred. 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)   Stroke (HCC)   HTN (hypertension)   HLD (hyperlipidemia)   CAD (coronary artery disease)   Chronic systolic CHF (congestive heart failure) (HCC)   Anxiety   Infection of left earlobe  Total Time spent with patient: 30 minutes  Past Psychiatric History: Past history of schizophrenia and stroke  Past Medical History:  Past Medical History:  Diagnosis Date   Myocardial infarction (HCC)    Schizophrenia (HCC)    Stroke (HCC)     Past Surgical History:  Procedure Laterality Date   NO PAST SURGERIES     Family History:  Family History  Problem Relation Age of Onset   Hypertension Mother    Family Psychiatric  History: See previous Social History:  Social History   Substance and Sexual Activity  Alcohol Use Not Currently     Social History   Substance and Sexual Activity  Drug Use Not Currently    Social History   Socioeconomic History   Marital status: Single    Spouse name: Not on file   Number of children: Not on file   Years of education: Not on file   Highest education level: Not on file  Occupational History   Not on file  Tobacco Use   Smoking status: Never    Passive exposure: Never   Smokeless  tobacco: Never  Vaping Use   Vaping Use: Unknown  Substance and Sexual Activity   Alcohol use: Not Currently   Drug use: Not Currently   Sexual activity: Not Currently  Other Topics Concern   Not on file  Social History Narrative   Not on file   Social Determinants of Health   Financial Resource Strain: Not on file  Food Insecurity: Not on file  Transportation Needs: Not on file  Physical Activity: Not on file  Stress: Not on file  Social Connections: Not on file   Additional Social History:  Specify valuables returned:  (none)                      Sleep: Fair  Appetite:  Fair  Current Medications: Current Facility-Administered Medications  Medication Dose Route Frequency Provider Last Rate Last Admin   acetaminophen (TYLENOL) tablet 650 mg  650 mg Oral Q6H PRN Jess Sulak T, MD   650 mg at 12/14/22 0827   alum & mag hydroxide-simeth (MAALOX/MYLANTA) 200-200-20 MG/5ML suspension 30 mL  30 mL Oral Q4H PRN Leena Tiede T, MD   30 mL at 04/26/22 4132   aspirin EC tablet 81 mg  81 mg Oral Daily Jenene Kauffmann, [name], MD   81 mg at 12/14/22 0827   atorvastatin (LIPITOR) tablet 20 mg  20 mg Oral Daily Dasiah Hooley, [name], MD   20 mg at 12/14/22 [phone]  atropine 1 % ophthalmic solution 1 drop  1 drop Sublingual QID [name], MD   1 drop at 12/14/22 0830   benztropine (COGENTIN) tablet 0.5 mg  0.5 mg Oral BID Tully Mcinturff T, MD   0.5 mg at 12/14/22 0827   clonazePAM (KLONOPIN) tablet 1 mg  1 mg Oral TID PRN He, [name], MD   1 mg at 12/08/22 2146   cloZAPine (CLOZARIL) tablet 300 mg  300 mg Oral QHS Aprile Dickenson T, MD   300 mg at 12/13/22 2110   diphenhydrAMINE (BENADRYL) capsule 50 mg  50 mg Oral Q6H PRN Karsten Howry T, MD   50 mg at 12/07/22 1859   Or   diphenhydrAMINE (BENADRYL) injection 50 mg  50 mg Intramuscular Q6H PRN Areg Bialas T, MD       feeding supplement (ENSURE ENLIVE / ENSURE PLUS) liquid 237 mL  237 mL Oral BID BM Janathan Bribiesca T, MD   237 mL at 12/14/22 0947    gabapentin (NEURONTIN) capsule 300 mg  300 mg Oral TID Winford Hehn T, MD   300 mg at 12/14/22 0947   haloperidol (HALDOL) tablet 2 mg  2 mg Oral TID Tylisa Alcivar, [name], MD   2 mg at 12/14/22 0947   haloperidol (HALDOL) tablet 5 mg  5 mg Oral Q6H PRN Gypsy Kellogg T, MD   5 mg at 12/07/22 1859   Or   haloperidol lactate (HALDOL) injection 5 mg  5 mg Intramuscular Q6H PRN Shaquella Stamant T, MD       lisinopril (ZESTRIL) tablet 5 mg  5 mg Oral Daily Reyn Faivre T, MD   5 mg at 12/14/22 0827   magnesium hydroxide (MILK OF MAGNESIA) suspension 30 mL  30 mL Oral Daily PRN Astin Rape T, MD   30 mL at 12/03/22 1526   metoprolol succinate (TOPROL-XL) 24 hr tablet 25 mg  25 mg Oral Daily Dmarion Perfect T, MD   25 mg at 12/14/22 0827   ondansetron (ZOFRAN) tablet 4 mg  4 mg Oral Q8H PRN [name], NP   4 mg at 09/06/22 2320   paliperidone (INVEGA SUSTENNA) injection 156 mg  156 mg Intramuscular Q28 days Maud Rubendall T, MD   156 mg at 11/20/22 1741   paliperidone (INVEGA) 24 hr tablet 6 mg  6 mg Oral QHS Arelis Neumeier T, MD   6 mg at 12/13/22 2109   senna-docusate (Senokot-S) tablet 2 tablet  2 tablet Oral QHS PRN [name], DO   2 tablet at 09/15/22 2110   temazepam (RESTORIL) capsule 15 mg  15 mg Oral QHS Josecarlos Harriott T, MD   15 mg at 12/13/22 2109   ziprasidone (GEODON) injection 20 mg  20 mg Intramuscular Q12H PRN Francesa Eugenio, [name], MD   20 mg at 07/08/22 1453    Lab Results: No results found for this or any previous visit (from the past 59 hour(s)).  Blood Alcohol level:  Lab Results  Component Value Date   ETH <10 63/12/6008    Metabolic Disorder Labs: Lab Results  Component Value Date   HGBA1C 5.5 04/10/2022   MPG 111.15 04/10/2022   No results found for: "PROLACTIN" Lab Results  Component Value Date   CHOL 167 11/20/2021   TRIG 107 11/20/2021   HDL 26 (L) 11/20/2021   CHOLHDL 6.4 11/20/2021   VLDL 21 11/20/2021   LDLCALC 120 (H) 11/20/2021    Physical  Findings: AIMS: Facial and Oral Movements Muscles of  Facial Expression: None, normal Lips and Perioral Area: None, normal Jaw: None, normal Tongue: None, normal,Extremity Movements Upper (arms, wrists, hands, fingers): None, normal Lower (legs, knees, ankles, toes): None, normal, Trunk Movements Neck, shoulders, hips: None, normal, Overall Severity Severity of abnormal movements (highest score from questions above): None, normal Incapacitation due to abnormal movements: None, normal Patient's awareness of abnormal movements (rate only patient's report): No Awareness, Dental Status Current problems with teeth and/or dentures?: No Does patient usually wear dentures?: No  CIWA:    COWS:     Musculoskeletal: Strength & Muscle Tone: within normal limits Gait & Station: normal Patient leans: N/A  Psychiatric Specialty Exam:  Presentation  General Appearance:  Appropriate for Environment; Casual; Neat; Well Groomed  Eye Contact: Fair  Speech: Slow  Speech Volume: Decreased  Handedness: Left   Mood and Affect  Mood: Anxious  Affect: Congruent   Thought Process  Thought Processes: Goal Directed  Descriptions of Associations:Circumstantial  Orientation:Partial  Thought Content:Scattered  History of Schizophrenia/Schizoaffective disorder:Yes  Duration of Psychotic Symptoms:Greater than six months  Hallucinations:No data recorded Ideas of Reference:None  Suicidal Thoughts:No data recorded Homicidal Thoughts:No data recorded  Sensorium  Memory: Remote Good  Judgment: Fair  Insight: Fair   Community education officer  Concentration: Fair  Attention Span: Fair  Recall: [employer] of Knowledge: Fair  Language: Fair   Psychomotor Activity  Psychomotor Activity:No data recorded  Assets  Assets:No data recorded  Sleep  Sleep:No data recorded   Physical Exam: Physical Exam Vitals and nursing note reviewed.  Constitutional:       Appearance: Normal appearance.  HENT:     Head: Normocephalic and atraumatic.     Mouth/Throat:     Pharynx: Oropharynx is clear.  Eyes:     Pupils: Pupils are equal, round, and reactive to light.  Cardiovascular:     Rate and Rhythm: Normal rate and regular rhythm.  Pulmonary:     Effort: Pulmonary effort is normal.     Breath sounds: Normal breath sounds.  Abdominal:     General: Abdomen is flat.     Palpations: Abdomen is soft.  Musculoskeletal:        General: Normal range of motion.  Skin:    General: Skin is warm and dry.  Neurological:     General: No focal deficit present.     Mental Status: He is alert. Mental status is at baseline.  Psychiatric:        Attention and Perception: He is inattentive.        Mood and Affect: Affect is inappropriate.        Speech: He is noncommunicative.    Review of Systems  Unable to perform ROS: Language   Blood pressure 122/80, pulse 79, temperature 98.5 F (36.9 C), temperature source Oral, resp. rate 20, height 5\' 8"  (1.727 m), weight 85.8 kg, SpO2 99 %. Body mass index is 28.76 kg/m.   Treatment Plan Summary: Medication management and Plan no change to medication management.  He has PRNs available if needed but he has settled down now just from attention and empathy and redirection.  Fundamentally no change in overall situation we are still working on discharge planning.  Alethia Berthold, MD 12/14/2022, 11:14 AM

## 2022-12-14 NOTE — Progress Notes (Signed)
Pt denies SI/HI/AVH and verbally agrees to approach staff if these become apparent or before harming themselves/others. Rates pain 6 on the face scale. Pt got agitated toward the evening after seeing one of the security guards and was given PRNs. Pt has been fine since. Pt agreed to taking them. Scheduled medications administered to pt, per MD orders. RN provided support and encouragement to pt. Q15 min safety checks implemented and continued. Pt safe on the unit. RN will continue to monitor and intervene as needed.  12/14/22 0827  Psych Admission Type (Psych Patients Only)  Admission Status Involuntary  Psychosocial Assessment  Patient Complaints Irritability  Eye Contact Fair  Facial Expression Animated;Angry  Affect Anxious  Speech Aphasic  Interaction Childlike  Motor Activity Pacing  Appearance/Hygiene Unremarkable  Behavior Characteristics Cooperative;Appropriate to situation;Anxious  Mood Anxious;Labile;Irritable  Aggressive Behavior  Effect No apparent injury  Thought Process  Coherency Circumstantial  Content UTA  Delusions None reported or observed  Perception UTA  Hallucination UTA  Judgment Impaired  Confusion UTA  Danger to Self  Current suicidal ideation? Denies  Danger to Others  Danger to Others None reported or observed  Danger to Others Abnormal  Harmful Behavior to others No threats or harm toward other people  Destructive Behavior No threats or harm toward property

## 2022-12-15 NOTE — Progress Notes (Signed)
[facility] MD Progress Note  12/15/2022 12:15 PM Adam [name]  MRN:  [id number] Subjective: Adam [name] is seen on rounds.  He has no complaints.  Nurses report no issues.  He has been compliant with medications and denies any side effects. 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[place])   Stroke (HCC)   HTN (hypertension)   HLD (hyperlipidemia)   CAD (coronary artery disease)   Chronic systolic CHF (congestive heart failure) (HCC)   Anxiety   Infection of left earlobe  Total Time spent with patient: 15 minutes  Past Psychiatric History: CVA and schizophrenia  Past Medical History:  Past Medical History:  Diagnosis Date   Myocardial infarction ([place])    Schizophrenia ([place])    Stroke ([place])     Past Surgical History:  Procedure Laterality Date   NO PAST SURGERIES     Family History:  Family History  Problem Relation Age of Onset   Hypertension Mother     Social History:  Social History   Substance and Sexual Activity  Alcohol Use Not Currently     Social History   Substance and Sexual Activity  Drug Use Not Currently    Social History   Socioeconomic History   Marital status: Single    Spouse name: Not on file   Number of children: Not on file   Years of education: Not on file   Highest education level: Not on file  Occupational History   Not on file  Tobacco Use   Smoking status: Never    Passive exposure: Never   Smokeless tobacco: Never  Vaping Use   Vaping Use: Unknown  Substance and Sexual Activity   Alcohol use: Not Currently   Drug use: Not Currently   Sexual activity: Not Currently  Other Topics Concern   Not on file  Social History Narrative   Not on file   Social Determinants of Health   Financial Resource Strain: Not on file  Food  Insecurity: Not on file  Transportation Needs: Not on file  Physical Activity: Not on file  Stress: Not on file  Social Connections: Not on file   Additional Social History:  Specify valuables returned:  (none)                      Sleep: Good  Appetite:  Good  Current Medications: Current Facility-Administered Medications  Medication Dose Route Frequency Provider Last Rate Last Admin   acetaminophen (TYLENOL) tablet 650 mg  650 mg Oral Q6H PRN [name], [name], MD   650 mg at 12/15/22 1205   alum & mag hydroxide-simeth (MAALOX/MYLANTA) 200-200-20 MG/5ML suspension 30 mL  30 mL Oral Q4H PRN [name], [name], MD   30 mL at 04/26/22 1884   aspirin EC tablet 81 mg  81 mg Oral Daily [name], [name], MD   81 mg at 12/15/22 0913   atorvastatin (LIPITOR) tablet 20 mg  20 mg Oral Daily [name], [name], MD   20 mg at 12/15/22 0912   atropine 1 % ophthalmic solution 1 drop  1 drop Sublingual QID [name], MD   1 drop at 12/15/22 0913   benztropine (COGENTIN) tablet 0.5 mg  0.5 mg Oral BID [name], [name], MD   0.5 mg at 12/15/22 0912   clonazePAM (KLONOPIN) tablet 1 mg  1 mg  Oral TID PRN He, [name], MD   1 mg at 12/14/22 1709   cloZAPine (CLOZARIL) tablet 300 mg  300 mg Oral QHS [name], [name], MD   300 mg at 12/14/22 2258   diphenhydrAMINE (BENADRYL) capsule 50 mg  50 mg Oral Q6H PRN [name], [name], MD   50 mg at 12/14/22 1709   Or   diphenhydrAMINE (BENADRYL) injection 50 mg  50 mg Intramuscular Q6H PRN [name], [name], MD       feeding supplement (ENSURE ENLIVE / ENSURE PLUS) liquid 237 mL  237 mL Oral BID BM [name], [name], MD   237 mL at 12/15/22 0932   gabapentin (NEURONTIN) capsule 300 mg  300 mg Oral TID [name], [name], MD   300 mg at 12/15/22 3016   haloperidol (HALDOL) tablet 2 mg  2 mg Oral TID [name], [name], MD   2 mg at 12/15/22 0109   haloperidol (HALDOL) tablet 5 mg  5 mg Oral Q6H PRN [name], [name], MD   5 mg at 12/14/22 1709   Or   haloperidol lactate (HALDOL)  injection 5 mg  5 mg Intramuscular Q6H PRN [name], [name], MD       lisinopril (ZESTRIL) tablet 5 mg  5 mg Oral Daily [name], [name], MD   5 mg at 12/15/22 0912   magnesium hydroxide (MILK OF MAGNESIA) suspension 30 mL  30 mL Oral Daily PRN [name], [name], MD   30 mL at 12/03/22 1526   metoprolol succinate (TOPROL-XL) 24 hr tablet 25 mg  25 mg Oral Daily [name], [name], MD   25 mg at 12/15/22 0914   ondansetron (ZOFRAN) tablet 4 mg  4 mg Oral Q8H PRN Lerry Liner M, NP   4 mg at 09/06/22 2320   paliperidone (INVEGA SUSTENNA) injection 156 mg  156 mg Intramuscular Q28 days [name], [name], MD   156 mg at 11/20/22 1741   paliperidone (INVEGA) 24 hr tablet 6 mg  6 mg Oral QHS [name], [name], MD   6 mg at 12/14/22 2258   senna-docusate (Senokot-S) tablet 2 tablet  2 tablet Oral QHS PRN Sarina Ill, DO   2 tablet at 09/15/22 2110   temazepam (RESTORIL) capsule 15 mg  15 mg Oral QHS [name], [name], MD   15 mg at 12/14/22 2259   ziprasidone (GEODON) injection 20 mg  20 mg Intramuscular Q12H PRN [name], [name], MD   20 mg at 07/08/22 1453    Lab Results: No results found for this or any previous visit (from the past 48 hour(s)).  Blood Alcohol level:  Lab Results  Component Value Date   ETH <10 07/20/2021    Metabolic Disorder Labs: Lab Results  Component Value Date   HGBA1C 5.5 04/10/2022   MPG 111.15 04/10/2022   No results found for: "PROLACTIN" Lab Results  Component Value Date   CHOL 167 11/20/2021   TRIG 107 11/20/2021   HDL 26 (L) 11/20/2021   CHOLHDL 6.4 11/20/2021   VLDL 21 11/20/2021   LDLCALC 120 (H) 11/20/2021    Physical Findings: AIMS: Facial and Oral Movements Muscles of Facial Expression: None, normal Lips and Perioral Area: None, normal Jaw: None, normal Tongue: None, normal,Extremity Movements Upper (arms, wrists, hands, fingers): None, normal Lower (legs, knees, ankles, toes): None, normal, Trunk Movements Neck, shoulders, hips: None,  normal, Overall Severity Severity of abnormal movements (highest score from questions above): None, normal Incapacitation due to abnormal  movements: None, normal Patient's awareness of abnormal movements (rate only patient's report): No Awareness, Dental Status Current problems with teeth and/or dentures?: No Does patient usually wear dentures?: No  CIWA:    COWS:     Musculoskeletal: Strength & Muscle Tone: within normal limits Gait & Station: normal Patient leans: N/A  Psychiatric Specialty Exam:  Presentation  General Appearance:  Appropriate for Environment; Casual; Neat; Well Groomed  Eye Contact: Fair  Speech: Slow  Speech Volume: Decreased  Handedness: Left   Mood and Affect  Mood: Anxious  Affect: Congruent   Thought Process  Thought Processes: Goal Directed  Descriptions of Associations:Circumstantial  Orientation:Partial  Thought Content:Scattered  History of Schizophrenia/Schizoaffective disorder:Yes  Duration of Psychotic Symptoms:Greater than six months  Hallucinations:No data recorded Ideas of Reference:None  Suicidal Thoughts:No data recorded Homicidal Thoughts:No data recorded  Sensorium  Memory: Remote Good  Judgment: Fair  Insight: Fair   Materials engineer: Fair  Attention Span: Fair  Recall: [place] of Knowledge: Fair  Language: Fair   Psychomotor Activity  Psychomotor Activity:No data recorded  Assets  Assets:No data recorded  Sleep  Sleep:No data recorded    Blood pressure 119/83, pulse 97, temperature (!) 97.4 F (36.3 C), temperature source Oral, resp. rate 18, height 5\' 8"  (1.727 m), weight 85.8 kg, SpO2 99 %. Body mass index is 28.76 kg/m.   Treatment Plan Summary: Daily contact with patient to assess and evaluate symptoms and progress in treatment, Medication management, and Plan continue current medications.  [name], DO 12/15/2022, 12:15 PM

## 2022-12-15 NOTE — Plan of Care (Signed)
  Problem: Education: Goal: Ability to state activities that reduce stress will improve Outcome: Not Progressing   Problem: Coping: Goal: Ability to identify and develop effective coping behavior will improve Outcome: Not Progressing   Problem: Self-Concept: Goal: Ability to identify factors that promote anxiety will improve Outcome: Not Progressing Goal: Level of anxiety will decrease Outcome: Not Progressing Goal: Ability to modify response to factors that promote anxiety will improve Outcome: Not Progressing   Problem: Education: Goal: Knowledge of General Education information will improve Description: Including pain rating scale, medication(s)/side effects and non-pharmacologic comfort measures Outcome: Not Progressing   Problem: Health Behavior/Discharge Planning: Goal: Ability to manage health-related needs will improve Outcome: Not Progressing   Problem: Clinical Measurements: Goal: Ability to maintain clinical measurements within normal limits will improve Outcome: Not Progressing Goal: Will remain free from infection Outcome: Not Progressing Goal: Diagnostic test results will improve Outcome: Not Progressing Goal: Respiratory complications will improve Outcome: Not Progressing Goal: Cardiovascular complication will be avoided Outcome: Not Progressing   Problem: Activity: Goal: Risk for activity intolerance will decrease Outcome: Not Progressing   Problem: Nutrition: Goal: Adequate nutrition will be maintained Outcome: Not Progressing   Problem: Coping: Goal: Level of anxiety will decrease Outcome: Not Progressing   Problem: Elimination: Goal: Will not experience complications related to bowel motility Outcome: Not Progressing Goal: Will not experience complications related to urinary retention Outcome: Not Progressing   Problem: Pain Managment: Goal: General experience of comfort will improve Outcome: Not Progressing   Problem: Safety: Goal:  Ability to remain free from injury will improve Outcome: Not Progressing   Problem: Skin Integrity: Goal: Risk for impaired skin integrity will decrease Outcome: Not Progressing   Problem: Activity: Goal: Interest or engagement in leisure activities will improve Outcome: Not Progressing   Problem: Coping: Goal: Coping ability will improve Outcome: Not Progressing   Problem: Education: Goal: Will be free of psychotic symptoms Outcome: Not Progressing   

## 2022-12-15 NOTE — Progress Notes (Signed)
D- Patient alert and oriented x 2-3. Affect animated/mood anxious. Denies SI/ HI/ AVH. He complains of left leg pain 5/10.PRN Tylenol administered with good results 1/10. A- Scheduled medications administered to patient, per MD orders. Support and encouragement provided.  Routine safety checks conducted every 15 minutes.  Patient informed to notify staff with problems or concerns. R- No adverse drug reactions noted. Patient compliant with medications and treatment plan. Patient receptive and cooperative. Patient interacts well with others. Patient contracts for safety and remains safe on the unit at this time.

## 2022-12-16 LAB — CBC WITH DIFFERENTIAL/PLATELET
Abs Immature Granulocytes: 0.02 10*3/uL (ref 0.00–0.07)
Basophils Absolute: 0.2 10*3/uL — ABNORMAL HIGH (ref 0.0–0.1)
Basophils Relative: 3 %
Eosinophils Absolute: 0 10*3/uL (ref 0.0–0.5)
Eosinophils Relative: 0 %
HCT: 41.5 % (ref 39.0–52.0)
Hemoglobin: 14 g/dL (ref 13.0–17.0)
Immature Granulocytes: 0 %
Lymphocytes Relative: 21 %
Lymphs Abs: 1.2 10*3/uL (ref 0.7–4.0)
MCH: 30.4 pg (ref 26.0–34.0)
MCHC: 33.7 g/dL (ref 30.0–36.0)
MCV: 90 fL (ref 80.0–100.0)
Monocytes Absolute: 0.6 10*3/uL (ref 0.1–1.0)
Monocytes Relative: 10 %
Neutro Abs: 3.9 10*3/uL (ref 1.7–7.7)
Neutrophils Relative %: 66 %
Platelets: 179 10*3/uL (ref 150–400)
RBC: 4.61 MIL/uL (ref 4.22–5.81)
RDW: 12.6 % (ref 11.5–15.5)
WBC: 5.8 10*3/uL (ref 4.0–10.5)
nRBC: 0 % (ref 0.0–0.2)

## 2022-12-16 NOTE — Plan of Care (Signed)
  Problem: Coping: Goal: Ability to identify and develop effective coping behavior will improve Outcome: Progressing   Problem: Self-Concept: Goal: Level of anxiety will decrease Outcome: Progressing   Problem: Education: Goal: Knowledge of General Education information will improve Description: Including pain rating scale, medication(s)/side effects and non-pharmacologic comfort measures Outcome: Progressing   Problem: Nutrition: Goal: Adequate nutrition will be maintained Outcome: Progressing   Problem: Coping: Goal: Level of anxiety will decrease Outcome: Progressing

## 2022-12-16 NOTE — [facility] Group Notes (Signed)
[facility] Group Notes:  (Nursing/MHT/Case Management/Adjunct)  Date:  12/16/2022  Time:  9:20 AM  Type of Therapy:   community meeting  Participation Level:  None  Participation Quality:   none  Affect:  Appropriate  Cognitive:  Appropriate  Insight:  None  Engagement in Group:  None  Modes of Intervention:  Discussion and Education  Summary of Progress/Problems:  Adam [name] 12/16/2022, 9:20 AM

## 2022-12-16 NOTE — Progress Notes (Signed)
[facility] MD Progress Note  12/16/2022 12:25 PM Adam [name]  MRN:  [id number] Subjective: Adam [name] is seen on rounds.  No changes.  He has been very pleasant and cooperative.  He has been compliant with his medications.  Good controls.  Nurses report no issues.  He denies any problems. 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[place])   Stroke (HCC)   HTN (hypertension)   HLD (hyperlipidemia)   CAD (coronary artery disease)   Chronic systolic CHF (congestive heart failure) (HCC)   Anxiety   Infection of left earlobe  Total Time spent with patient: 15 minutes  Past Psychiatric History: CVA and schizophrenia  Past Medical History:  Past Medical History:  Diagnosis Date   Myocardial infarction ([place])    Schizophrenia ([place])    Stroke ([place])     Past Surgical History:  Procedure Laterality Date   NO PAST SURGERIES     Family History:  Family History  Problem Relation Age of Onset   Hypertension Mother     Social History:  Social History   Substance and Sexual Activity  Alcohol Use Not Currently     Social History   Substance and Sexual Activity  Drug Use Not Currently    Social History   Socioeconomic History   Marital status: Single    Spouse name: Not on file   Number of children: Not on file   Years of education: Not on file   Highest education level: Not on file  Occupational History   Not on file  Tobacco Use   Smoking status: Never    Passive exposure: Never   Smokeless tobacco: Never  Vaping Use   Vaping Use: Unknown  Substance and Sexual Activity   Alcohol use: Not Currently   Drug use: Not Currently   Sexual activity: Not Currently  Other Topics Concern   Not on file  Social History Narrative   Not on file   Social Determinants of Health    Financial Resource Strain: Not on file  Food Insecurity: Not on file  Transportation Needs: Not on file  Physical Activity: Not on file  Stress: Not on file  Social Connections: Not on file   Additional Social History:  Specify valuables returned:  (none)                      Sleep: Good  Appetite:  Good  Current Medications: Current Facility-Administered Medications  Medication Dose Route Frequency Provider Last Rate Last Admin   acetaminophen (TYLENOL) tablet 650 mg  650 mg Oral Q6H PRN [name], [name], MD   650 mg at 12/16/22 0806   alum & mag hydroxide-simeth (MAALOX/MYLANTA) 200-200-20 MG/5ML suspension 30 mL  30 mL Oral Q4H PRN [name], [name], MD   30 mL at 04/26/22 4431   aspirin EC tablet 81 mg  81 mg Oral Daily [name], [name], MD   81 mg at 12/16/22 5400   atorvastatin (LIPITOR) tablet 20 mg  20 mg Oral Daily [name], [name], MD   20 mg at 12/16/22 0808   atropine 1 % ophthalmic solution 1 drop  1 drop Sublingual QID [name], MD   1 drop at 12/16/22 0810   benztropine (COGENTIN) tablet 0.5 mg  0.5 mg Oral BID [name], [name], MD   0.5 mg at 12/16/22 [phone]  clonazePAM (KLONOPIN) tablet 1 mg  1 mg Oral TID PRN He, [name], MD   1 mg at 12/14/22 1709   cloZAPine (CLOZARIL) tablet 300 mg  300 mg Oral QHS [name], [name], MD   300 mg at 12/15/22 2154   diphenhydrAMINE (BENADRYL) capsule 50 mg  50 mg Oral Q6H PRN [name], [name], MD   50 mg at 12/14/22 1709   Or   diphenhydrAMINE (BENADRYL) injection 50 mg  50 mg Intramuscular Q6H PRN [name], [name], MD       feeding supplement (ENSURE ENLIVE / ENSURE PLUS) liquid 237 mL  237 mL Oral BID BM [name], [name], MD   237 mL at 12/15/22 1430   gabapentin (NEURONTIN) capsule 300 mg  300 mg Oral TID [name], [name], MD   300 mg at 12/15/22 2004   haloperidol (HALDOL) tablet 2 mg  2 mg Oral TID [name], [name], MD   2 mg at 12/16/22 0900   haloperidol (HALDOL) tablet 5 mg  5 mg Oral Q6H PRN [name], [name], MD   5 mg at  12/14/22 1709   Or   haloperidol lactate (HALDOL) injection 5 mg  5 mg Intramuscular Q6H PRN [name], [name], MD       lisinopril (ZESTRIL) tablet 5 mg  5 mg Oral Daily [name], [name], MD   5 mg at 12/16/22 1443   magnesium hydroxide (MILK OF MAGNESIA) suspension 30 mL  30 mL Oral Daily PRN [name], [name], MD   30 mL at 12/03/22 1526   metoprolol succinate (TOPROL-XL) 24 hr tablet 25 mg  25 mg Oral Daily [name], [name], MD   25 mg at 12/16/22 0808   ondansetron (ZOFRAN) tablet 4 mg  4 mg Oral Q8H PRN Deloria Lair, NP   4 mg at 09/06/22 2320   paliperidone (INVEGA SUSTENNA) injection 156 mg  156 mg Intramuscular Q28 days [name], [name], MD   156 mg at 11/20/22 1741   paliperidone (INVEGA) 24 hr tablet 6 mg  6 mg Oral QHS [name], [name], MD   6 mg at 12/15/22 2154   senna-docusate (Senokot-S) tablet 2 tablet  2 tablet Oral QHS PRN [name], DO   2 tablet at 09/15/22 2110   temazepam (RESTORIL) capsule 15 mg  15 mg Oral QHS [name], [name], MD   15 mg at 12/15/22 2154   ziprasidone (GEODON) injection 20 mg  20 mg Intramuscular Q12H PRN [name], [name], MD   20 mg at 07/08/22 1453    Lab Results:  Results for orders placed or performed during the hospital encounter of 03/19/22 (from the past 48 hour(s))  CBC with Differential/Platelet     Status: Abnormal   Collection Time: 12/16/22 11:40 AM  Result Value Ref Range   WBC 5.8 4.0 - 10.5 K/uL   RBC 4.61 4.22 - 5.81 MIL/uL   Hemoglobin 14.0 13.0 - 17.0 g/dL   HCT 41.5 39.0 - 52.0 %   MCV 90.0 80.0 - 100.0 fL   MCH 30.4 26.0 - 34.0 pg   MCHC 33.7 30.0 - 36.0 g/dL   RDW 12.6 11.5 - 15.5 %   Platelets 179 150 - 400 K/uL   nRBC 0.0 0.0 - 0.2 %   Neutrophils Relative % 66 %   Neutro Abs 3.9 1.7 - 7.7 K/uL   Lymphocytes Relative 21 %   Lymphs Abs 1.2 0.7 - 4.0 K/uL   Monocytes Relative 10 %  Monocytes Absolute 0.6 0.1 - 1.0 K/uL   Eosinophils Relative 0 %   Eosinophils Absolute 0.0 0.0 - 0.5 K/uL   Basophils Relative  3 %   Basophils Absolute 0.2 (H) 0.0 - 0.1 K/uL   Immature Granulocytes 0 %   Abs Immature Granulocytes 0.02 0.00 - 0.07 K/uL    Comment: Performed at [facility], [address] [place], [place] [address]    Blood Alcohol level:  Lab Results  Component Value Date   [facility] <10 07/20/2021    Metabolic Disorder Labs: Lab Results  Component Value Date   HGBA1C 5.5 04/10/2022   MPG 111.15 04/10/2022   No results found for: "PROLACTIN" Lab Results  Component Value Date   CHOL 167 11/20/2021   TRIG 107 11/20/2021   HDL 26 (L) 11/20/2021   CHOLHDL 6.4 11/20/2021   VLDL 21 11/20/2021   LDLCALC 120 (H) 11/20/2021    Physical Findings: AIMS: Facial and Oral Movements Muscles of Facial Expression: None, normal Lips and Perioral Area: None, normal Jaw: None, normal Tongue: None, normal,Extremity Movements Upper (arms, wrists, hands, fingers): None, normal Lower (legs, knees, ankles, toes): None, normal, Trunk Movements Neck, shoulders, hips: None, normal, Overall Severity Severity of abnormal movements (highest score from questions above): None, normal Incapacitation due to abnormal movements: None, normal Patient's awareness of abnormal movements (rate only patient's report): No Awareness, Dental Status Current problems with teeth and/or dentures?: No Does patient usually wear dentures?: No  CIWA:    COWS:     Musculoskeletal: Strength & Muscle Tone: within normal limits Gait & Station: normal Patient leans: N/A  Psychiatric Specialty Exam:  Presentation  General Appearance:  Appropriate for Environment; Casual; Neat; Well Groomed  Eye Contact: Fair  Speech: Slow  Speech Volume: Decreased  Handedness: Left   Mood and Affect  Mood: Anxious  Affect: Congruent   Thought Process  Thought Processes: Goal Directed  Descriptions of Associations:Circumstantial  Orientation:Partial  Thought Content:Scattered  History of  Schizophrenia/Schizoaffective disorder:Yes  Duration of Psychotic Symptoms:Greater than six months  Hallucinations:No data recorded Ideas of Reference:None  Suicidal Thoughts:No data recorded Homicidal Thoughts:No data recorded  Sensorium  Memory: Remote Good  Judgment: Fair  Insight: Fair   Chartered certified accountant: Fair  Attention Span: Fair  Recall: Fair  Fund of Knowledge: Fair  Language: Fair   Psychomotor Activity  Psychomotor Activity:No data recorded  Assets  Assets:No data recorded  Sleep  Sleep:No data recorded    Blood pressure 112/89, pulse 84, temperature (!) 97.5 F (36.4 C), temperature source Oral, resp. rate 17, height 5\' 8"  (1.727 m), weight 85.8 kg, SpO2 98 %. Body mass index is 28.76 kg/m.   Treatment Plan Summary: Daily contact with patient to assess and evaluate symptoms and progress in treatment, Medication management, and Plan continue current medications.  , DO 12/16/2022, 12:25 PM

## 2022-12-16 NOTE — Plan of Care (Signed)
Pt is calm and cooperative; labile at times related to sporting events on TV and difficulty expressing self due to aphasia.  Pt is compliant with medications and denies any physical difficulties.  No SI HI AVH reported.  Continued monitoring via q 15 minute checks.

## 2022-12-16 NOTE — Progress Notes (Signed)
D- Patient alert and oriented x 3-4. Affect animated/mood labile. Denies SI/HI/AVH. He complains of right leg pain 7/10 relived by PRN Tylenol 0/10. He communicates loudly with echolalia speech. A- Scheduled medications administered to patient, per MD orders. Support and encouragement provided.  Routine safety checks conducted every 15 minutes without incident. Patient informed to notify staff with problems or concerns. R- No adverse drug reactions noted. Patient compliant with medications and treatment plan. Patient disorganized but redirectable. Patient interacts well with others. He contracts for safety and remains safe on the unit at this time.

## 2022-12-16 NOTE — Progress Notes (Signed)
Pharmacy Consult - Clozapine     62 yo male ordered clozapine 300 mg PO daily  This patient's order has been reviewed for prescribing contraindications.    Clozapine REMS enrollment Verified: yes/no on yes DATE1/7 REMS patient ID: [id number] Current Outpatient Monitoring: Weekly    Home Regimen:  300 mg PO QHS    Dose Adjustments This Admission: N/A   Labs: Date    [place]    Submitted? 1/7 2600 Yes 1/14 3900 Yes      Plan: RA: [id number] Continue Clozapine 300mg  daily  Monitor ANC at least weekly while inpatient

## 2022-12-16 NOTE — Plan of Care (Signed)
  Problem: Education: Goal: Ability to state activities that reduce stress will improve Outcome: Not Progressing   Problem: Coping: Goal: Ability to identify and develop effective coping behavior will improve Outcome: Not Progressing   Problem: Self-Concept: Goal: Ability to identify factors that promote anxiety will improve Outcome: Not Progressing Goal: Level of anxiety will decrease Outcome: Not Progressing Goal: Ability to modify response to factors that promote anxiety will improve Outcome: Not Progressing   Problem: Education: Goal: Knowledge of General Education information will improve Description: Including pain rating scale, medication(s)/side effects and non-pharmacologic comfort measures Outcome: Not Progressing   Problem: Health Behavior/Discharge Planning: Goal: Ability to manage health-related needs will improve Outcome: Not Progressing   Problem: Clinical Measurements: Goal: Ability to maintain clinical measurements within normal limits will improve Outcome: Not Progressing Goal: Will remain free from infection Outcome: Not Progressing Goal: Diagnostic test results will improve Outcome: Not Progressing Goal: Respiratory complications will improve Outcome: Not Progressing Goal: Cardiovascular complication will be avoided Outcome: Not Progressing   Problem: Activity: Goal: Risk for activity intolerance will decrease Outcome: Not Progressing   Problem: Nutrition: Goal: Adequate nutrition will be maintained Outcome: Not Progressing   Problem: Coping: Goal: Level of anxiety will decrease Outcome: Not Progressing   Problem: Elimination: Goal: Will not experience complications related to bowel motility Outcome: Not Progressing Goal: Will not experience complications related to urinary retention Outcome: Not Progressing   Problem: Pain Managment: Goal: General experience of comfort will improve Outcome: Not Progressing   Problem: Safety: Goal:  Ability to remain free from injury will improve Outcome: Not Progressing   Problem: Skin Integrity: Goal: Risk for impaired skin integrity will decrease Outcome: Not Progressing   Problem: Activity: Goal: Interest or engagement in leisure activities will improve Outcome: Not Progressing   Problem: Coping: Goal: Coping ability will improve Outcome: Not Progressing   Problem: Education: Goal: Will be free of psychotic symptoms Outcome: Not Progressing   

## 2022-12-17 NOTE — Plan of Care (Signed)
  Problem: Education: Goal: Ability to state activities that reduce stress will improve Outcome: Not Progressing   Problem: Coping: Goal: Ability to identify and develop effective coping behavior will improve Outcome: Not Progressing   Problem: Self-Concept: Goal: Ability to identify factors that promote anxiety will improve Outcome: Not Progressing Goal: Level of anxiety will decrease Outcome: Not Progressing Goal: Ability to modify response to factors that promote anxiety will improve Outcome: Not Progressing   Problem: Education: Goal: Knowledge of General Education information will improve Description: Including pain rating scale, medication(s)/side effects and non-pharmacologic comfort measures Outcome: Not Progressing   Problem: Health Behavior/Discharge Planning: Goal: Ability to manage health-related needs will improve Outcome: Not Progressing   Problem: Clinical Measurements: Goal: Ability to maintain clinical measurements within normal limits will improve Outcome: Not Progressing Goal: Will remain free from infection Outcome: Not Progressing Goal: Diagnostic test results will improve Outcome: Not Progressing Goal: Respiratory complications will improve Outcome: Not Progressing Goal: Cardiovascular complication will be avoided Outcome: Not Progressing   Problem: Activity: Goal: Risk for activity intolerance will decrease Outcome: Not Progressing   Problem: Nutrition: Goal: Adequate nutrition will be maintained Outcome: Not Progressing   Problem: Coping: Goal: Level of anxiety will decrease Outcome: Not Progressing   Problem: Elimination: Goal: Will not experience complications related to bowel motility Outcome: Not Progressing Goal: Will not experience complications related to urinary retention Outcome: Not Progressing   Problem: Pain Managment: Goal: General experience of comfort will improve Outcome: Not Progressing   Problem: Safety: Goal:  Ability to remain free from injury will improve Outcome: Not Progressing   Problem: Skin Integrity: Goal: Risk for impaired skin integrity will decrease Outcome: Not Progressing   Problem: Activity: Goal: Interest or engagement in leisure activities will improve Outcome: Not Progressing   Problem: Coping: Goal: Coping ability will improve Outcome: Not Progressing   Problem: Education: Goal: Will be free of psychotic symptoms Outcome: Not Progressing   

## 2022-12-17 NOTE — Progress Notes (Signed)
[facility] MD Progress Note  12/17/2022 10:44 AM Adam [name]  MRN:  [id number] Subjective: Follow-up 62 year old man with schizophrenia and stroke.  Patient seen and chart reviewed.  No evidence of new complaint.  No change to baseline behavior.  Physically seems to be stable. 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  Chronic cognitive disability as a result.  Past Medical History:  Past Medical History:  Diagnosis Date   Myocardial infarction ([place])    Schizophrenia ([place])    Stroke [facility])     Past Surgical History:  Procedure Laterality Date   NO PAST SURGERIES     Family History:  Family History  Problem Relation Age of Onset   Hypertension Mother    Family Psychiatric  History: See previous Social History:  Social History   Substance and Sexual Activity  Alcohol Use Not Currently     Social History   Substance and Sexual Activity  Drug Use Not Currently    Social History   Socioeconomic History   Marital status: Single    Spouse name: Not on file   Number of children: Not on file   Years of education: Not on file   Highest education level: Not on file  Occupational History   Not on file  Tobacco Use   Smoking status: Never    Passive exposure: Never   Smokeless tobacco: Never  Vaping Use   Vaping Use: Unknown  Substance and Sexual Activity   Alcohol use: Not Currently   Drug use: Not Currently   Sexual activity: Not  Currently  Other Topics Concern   Not on file  Social History Narrative   Not on file   Social Determinants of Health   Financial Resource Strain: Not on file  Food Insecurity: Not on file  Transportation Needs: Not on file  Physical Activity: Not on file  Stress: Not on file  Social Connections: Not on file   Additional Social History:  Specify valuables returned:  (none)                      Sleep: Good  Appetite:  Good  Current Medications: Current Facility-Administered Medications  Medication Dose Route Frequency Provider Last Rate Last Admin   acetaminophen (TYLENOL) tablet 650 mg  650 mg Oral Q6H PRN Hasaan Radde T, MD   650 mg at 12/17/22 0942   alum & mag hydroxide-simeth (MAALOX/MYLANTA) 200-200-20 MG/5ML suspension 30 mL  30 mL Oral Q4H PRN Derrill Bagnell T, MD   30 mL at 04/26/22 2229   aspirin EC tablet 81 mg  81 mg Oral Daily Zniyah Midkiff, [name], MD   81 mg at 12/17/22 0910   atorvastatin (LIPITOR) tablet 20 mg  20 mg Oral Daily Million Maharaj T, MD   20 mg at 12/17/22 1016   atropine 1 % ophthalmic solution 1 drop  1 drop Sublingual QID [name], MD   1 drop at 12/17/22 0912   benztropine (COGENTIN) tablet 0.5 mg  0.5 mg Oral BID  Chandlar Guice, [name], MD   0.5 mg at 12/17/22 0623   clonazePAM (KLONOPIN) tablet 1 mg  1 mg Oral TID PRN He, [name], MD   1 mg at 12/14/22 1709   cloZAPine (CLOZARIL) tablet 300 mg  300 mg Oral QHS Yaneliz Radebaugh T, MD   300 mg at 12/16/22 2039   diphenhydrAMINE (BENADRYL) capsule 50 mg  50 mg Oral Q6H PRN Neftaly Inzunza, [name], MD   50 mg at 12/14/22 1709   Or   diphenhydrAMINE (BENADRYL) injection 50 mg  50 mg Intramuscular Q6H PRN Boss Danielsen, [name], MD       feeding supplement (ENSURE ENLIVE / ENSURE PLUS) liquid 237 mL  237 mL Oral BID BM Bryndon Cumbie T, MD   237 mL at 12/17/22 0912   gabapentin (NEURONTIN) capsule 300 mg  300 mg Oral TID Avonda Toso, [name], MD   300 mg at 12/17/22 0941   haloperidol (HALDOL) tablet 2 mg  2 mg Oral TID Lucille Witts,  [name], MD   2 mg at 12/17/22 7628   haloperidol (HALDOL) tablet 5 mg  5 mg Oral Q6H PRN Dannette Kinkaid, [name], MD   5 mg at 12/14/22 1709   Or   haloperidol lactate (HALDOL) injection 5 mg  5 mg Intramuscular Q6H PRN Timoty Bourke, [name], MD       lisinopril (ZESTRIL) tablet 5 mg  5 mg Oral Daily Carole Deere T, MD   5 mg at 12/17/22 1016   magnesium hydroxide (MILK OF MAGNESIA) suspension 30 mL  30 mL Oral Daily PRN Jarnell Cordaro, [name], MD   30 mL at 12/03/22 1526   metoprolol succinate (TOPROL-XL) 24 hr tablet 25 mg  25 mg Oral Daily Shannel Zahm T, MD   25 mg at 12/17/22 1018   ondansetron (ZOFRAN) tablet 4 mg  4 mg Oral Q8H PRN Jearld Lesch, NP   4 mg at 09/06/22 2320   paliperidone (INVEGA SUSTENNA) injection 156 mg  156 mg Intramuscular Q28 days Kasem Mozer, [name], MD   156 mg at 11/20/22 1741   paliperidone (INVEGA) 24 hr tablet 6 mg  6 mg Oral QHS Helen Winterhalter T, MD   6 mg at 12/16/22 2038   senna-docusate (Senokot-S) tablet 2 tablet  2 tablet Oral QHS PRN Sarina Ill, DO   2 tablet at 09/15/22 2110   temazepam (RESTORIL) capsule 15 mg  15 mg Oral QHS Mignon Bechler T, MD   15 mg at 12/16/22 2039   ziprasidone (GEODON) injection 20 mg  20 mg Intramuscular Q12H PRN Jenefer Woerner, [name], MD   20 mg at 07/08/22 1453    Lab Results:  Results for orders placed or performed during the hospital encounter of 03/19/22 (from the past 48 hour(s))  CBC with Differential/Platelet     Status: Abnormal   Collection Time: 12/16/22 11:40 AM  Result Value Ref Range   WBC 5.8 4.0 - 10.5 K/uL   RBC 4.61 4.22 - 5.81 MIL/uL   Hemoglobin 14.0 13.0 - 17.0 g/dL   HCT 31.5 17.6 - 16.0 %   MCV 90.0 80.0 - 100.0 fL   MCH 30.4 26.0 - 34.0 pg   MCHC 33.7 30.0 - 36.0 g/dL   RDW 73.7 10.6 - 26.9 %   Platelets 179 150 - 400 K/uL   nRBC 0.0 0.0 - 0.2 %   Neutrophils Relative % 66 %   Neutro Abs 3.9 1.7 - 7.7 K/uL   Lymphocytes Relative 21 %  Lymphs Abs 1.2 0.7 - 4.0 K/uL   Monocytes Relative 10 %   Monocytes  Absolute 0.6 0.1 - 1.0 K/uL   Eosinophils Relative 0 %   Eosinophils Absolute 0.0 0.0 - 0.5 K/uL   Basophils Relative 3 %   Basophils Absolute 0.2 (H) 0.0 - 0.1 K/uL   Immature Granulocytes 0 %   Abs Immature Granulocytes 0.02 0.00 - 0.07 K/uL    Comment: Performed at [facility], [address] [place], [place] [address]    Blood Alcohol level:  Lab Results  Component Value Date   [facility] <10 60/45/4098    Metabolic Disorder Labs: Lab Results  Component Value Date   HGBA1C 5.5 04/10/2022   MPG 111.15 04/10/2022   No results found for: "PROLACTIN" Lab Results  Component Value Date   CHOL 167 11/20/2021   TRIG 107 11/20/2021   HDL 26 (L) 11/20/2021   CHOLHDL 6.4 11/20/2021   VLDL 21 11/20/2021   LDLCALC 120 (H) 11/20/2021    Physical Findings: AIMS: Facial and Oral Movements Muscles of Facial Expression: None, normal Lips and Perioral Area: None, normal Jaw: None, normal Tongue: None, normal,Extremity Movements Upper (arms, wrists, hands, fingers): None, normal Lower (legs, knees, ankles, toes): None, normal, Trunk Movements Neck, shoulders, hips: None, normal, Overall Severity Severity of abnormal movements (highest score from questions above): None, normal Incapacitation due to abnormal movements: None, normal Patient's awareness of abnormal movements (rate only patient's report): No Awareness, Dental Status Current problems with teeth and/or dentures?: No Does patient usually wear dentures?: No  CIWA:    COWS:     Musculoskeletal: Strength & Muscle Tone: within normal limits Gait & Station: normal Patient leans: N/A  Psychiatric Specialty Exam:  Presentation  General Appearance:  Appropriate for Environment; Casual; Neat; Well Groomed  Eye Contact: Fair  Speech: Slow  Speech Volume: Decreased  Handedness: Left   Mood and Affect  Mood: Anxious  Affect: Congruent   Thought Process  Thought Processes: Goal  Directed  Descriptions of Associations:Circumstantial  Orientation:Partial  Thought Content:Scattered  History of Schizophrenia/Schizoaffective disorder:Yes  Duration of Psychotic Symptoms:Greater than six months  Hallucinations:No data recorded Ideas of Reference:None  Suicidal Thoughts:No data recorded Homicidal Thoughts:No data recorded  Sensorium  Memory: Remote Good  Judgment: Fair  Insight: Fair   Community education officer  Concentration: Fair  Attention Span: Fair  Recall: [employer] of Knowledge: Fair  Language: Fair   Psychomotor Activity  Psychomotor Activity:No data recorded  Assets  Assets:No data recorded  Sleep  Sleep:No data recorded   Physical Exam: Physical Exam Vitals and nursing note reviewed.  Constitutional:      Appearance: Normal appearance.  HENT:     Head: Normocephalic and atraumatic.     Mouth/Throat:     Pharynx: Oropharynx is clear.  Eyes:     Pupils: Pupils are equal, round, and reactive to light.  Cardiovascular:     Rate and Rhythm: Normal rate and regular rhythm.  Pulmonary:     Effort: Pulmonary effort is normal.     Breath sounds: Normal breath sounds.  Abdominal:     General: Abdomen is flat.     Palpations: Abdomen is soft.  Musculoskeletal:        General: Normal range of motion.  Skin:    General: Skin is warm and dry.  Neurological:     General: No focal deficit present.     Mental Status: He is alert. Mental status is at baseline.  Psychiatric:  Attention and Perception: He is inattentive.        Mood and Affect: Affect is blunt.        Speech: He is noncommunicative.    Review of Systems  Unable to perform ROS: Language   Blood pressure 98/67, pulse 80, temperature 98 F (36.7 C), temperature source Oral, resp. rate 16, height 5\' 8"  (1.727 m), weight 85.8 kg, SpO2 99 %. Body mass index is 28.76 kg/m.   Treatment Plan Summary: Medication management and Plan follow-up 62 year old  male with schizophrenia and history of stroke.  No change clinical presentation at all.  Stable.  Continues to be here focused entirely on discharge planning.  67, MD 12/17/2022, 10:44 AM

## 2022-12-17 NOTE — Plan of Care (Signed)
Pt continues to deny HI SI AVH, calm and cooperative at times, labile mood throughout the shift.  Pt is compliant with medications.  Pt sometimes struggles with boundaries, self control and being patient with unit procedures/ time frames.  Positive reinforcement provided regarding appropriate way to behave on unit.  Continued monitoring via q 15 minute checks.

## 2022-12-17 NOTE — Group Note (Signed)
[facility] LCSW Group Therapy Note    Group Date: 12/17/2022 Start Time: 1300 End Time: 1400  Type of Therapy and Topic:  Group Therapy:  Overcoming Obstacles  Participation Level:  [facility] PARTICIPATION LEVEL: Minimal    Description of Group:   In this group patients will be encouraged to explore what they see as obstacles to their own wellness and recovery. They will be guided to discuss their thoughts, feelings, and behaviors related to these obstacles. The group will process together ways to cope with barriers, with attention given to specific choices patients can make. Each patient will be challenged to identify changes they are motivated to make in order to overcome their obstacles. This group will be process-oriented, with patients participating in exploration of their own experiences as well as giving and receiving support and challenge from other group members.  Therapeutic Goals: 1. Patient will identify personal and current obstacles as they relate to admission. 2. Patient will identify barriers that currently interfere with their wellness or overcoming obstacles.  3. Patient will identify feelings, thought process and behaviors related to these barriers. 4. Patient will identify two changes they are willing to make to overcome these obstacles:    Summary of Patient Progress Patient was present for the  entirety of the group process. His contribution to the discussion today was "rainbow" which is, per the other patients, what he calls one of his peers.    Therapeutic Modalities:   Cognitive Behavioral Therapy Solution Focused Therapy Motivational Interviewing Relapse Prevention Therapy   Shirl Harris, LCSW

## 2022-12-17 NOTE — [facility] Group Notes (Signed)
[facility] Group Notes:  (Nursing/MHT/Case Management/Adjunct)  Date:  12/17/2022  Time:  10:25 PM  Type of Therapy:  Group Therapy  Participation Level:  Active  Participation Quality:  Appropriate  Affect:  Flat  Cognitive:  Alert  Insight:  Improving  Engagement in Group:  Limited, Monopolizing, and Off Topic  Modes of Intervention:  Discussion and Education  Summary of Progress/Problems: Wrap up group completed during snack time.   [name],Chelsy Parrales E 12/17/2022, 10:25 PM

## 2022-12-17 NOTE — Progress Notes (Signed)
D- Patient alert and oriented x 2-3. Affect animated/mood labile. Denies SI/ HI/ AVH. Complains of right leg pain 5/10 pain- effective 0/10.   A- Scheduled medications administered to patient, per MD orders. Support and encouragement provided.  Routine safety checks conducted every 15 minutes.  Patient informed to notify staff with problems or concerns. R- No adverse drug reactions noted. Patient compliant with medications and treatment plan. Patient cooperative and redirectable. Patient interacts well with others. Patient contracts for safety and remains safe on the unit at this time.

## 2022-12-17 NOTE — Progress Notes (Signed)
Recreation Therapy Notes  Date: 12/17/2022   Time: 10:20 am   Location: Craft room      Behavioral response: N/A   Intervention Topic: Strengths   Discussion/Intervention: Patient refused to attend group.    Clinical Observations/Feedback:  Patient refused to attend group.    [place] Pechacek LRT/CTRS        Brien Lowe 12/17/2022 10:45 AM

## 2022-12-17 NOTE — Progress Notes (Signed)
Unremarkable night. Noted in milieu, helping peers, watching television. Patient is medication compliant, appropriate with staff and peers. Voiced no complaints or concerns. Encouragement and support provided. Safety checks maintained. Medications given as prescribed. Pt receptive and remains safe on unit with q 15 min checks.

## 2022-12-17 NOTE — BHH Counselor (Signed)
CSW spoke with the patient's mother.  Mother wanted update on patient's care at hospital.  CSW reiterated nursing reports that the patient is doing well.  Mother wanted update information on the patient possibly being placed.  CSW explained that CSW remains in communication with [facility] continues to work on a possible placement and any future placements.  Mom wanted confirmation of the weekly phone meeting and CSW explained that the meeting is managed by [facility].  CSW encouraged mother to follow up with [facility] on possibly attending the meeting as was the mothers request.  Assunta Curtis, MSW, LCSW 12/17/2022 12:41 PM

## 2022-12-18 NOTE — Plan of Care (Signed)
D: Patient alert and oriented. Patient endorses right leg pain. PRN tylenol given to patient. Patient no longer endorses pain in leg after pain has been reassessed. Patient denies anxiety and depression. Patient denies SI/HI/AVH. Patient was very excited to get scheduled invega sustenna injection. Patient has been interactive with staff and peers on the unit throughout shift.  A: Scheduled medications administered to patient, per MD orders.  Support and encouragement provided to patient.  Q15 minute safety checks maintained.   R: Patient compliant with medication administration and treatment plan. No adverse drug reactions noted. Patient remains safe on the unit at this time. Problem: Education: Goal: Knowledge of General Education information will improve Description: Including pain rating scale, medication(s)/side effects and non-pharmacologic comfort measures Outcome: Progressing   Problem: Clinical Measurements: Goal: Ability to maintain clinical measurements within normal limits will improve Outcome: Progressing   Problem: Pain Managment: Goal: General experience of comfort will improve Outcome: Progressing   Problem: Safety: Goal: Ability to remain free from injury will improve Outcome: Progressing   Problem: Education: Goal: Will be free of psychotic symptoms Outcome: Progressing

## 2022-12-18 NOTE — Progress Notes (Signed)
Recreation Therapy Notes   Date: 12/18/2022  Time: 10:15 am     Location: Craft room   Behavioral response: Appropriate  Intervention Topic:  Leisure    Discussion/Intervention:  Group content today was focused on leisure. The group defined what leisure is and some positive leisure activities they participate in. Individuals identified the difference between good and bad leisure. Participants expressed how they feel after participating in the leisure of their choice. The group discussed how they go about picking a leisure activity and if others are involved in their leisure activities. The patient stated how many leisure activities they have to choose from and reasons why it is important to have leisure time. Individuals participated in the intervention "Exploration of Leisure" where they had a chance to identify new leisure activities as well as benefits of leisure. Clinical Observations/Feedback: Patient came to group and was focused on what peers and staff had to say about leisure. Individual was social with staff and peers while participating in the intervention.  Rahcel Shutes LRT/CTRS         Dishawn Bhargava 12/18/2022 1:32 PM

## 2022-12-18 NOTE — [facility] Group Notes (Signed)
[facility] Group Notes:  (Nursing/MHT/Case Management/Adjunct)  Date:  12/18/2022  Time:  10:01 AM  Type of Therapy:   Community Meeting  Participation Level:  Minimal  Participation Quality:  Appropriate and Attentive  Affect:  Appropriate  Cognitive:  Alert and Appropriate  Insight:  Limited  Engagement in Group:  Limited  Modes of Intervention:  Discussion, Education, and Support  Summary of Progress/Problems:  Adam [name] Adam [name] 12/18/2022, 10:01 AM

## 2022-12-18 NOTE — Progress Notes (Signed)
Patient calm and pleasant during assessment. Pt denies SI/HI/AVH. Pt observed interacting appropriately with staff and peers on the unit. Pt compliant with medication administration per MD orders. Pt given education, support, and encouragement to be active in his treatment plan. Pt being monitored Q 15 minutes for safety per unit protocol, remains safe on the unit    

## 2022-12-18 NOTE — Group Note (Signed)
LCSW Group Therapy Note  Group Date: 12/18/2022 Start Time: 1300 End Time: 1400   Type of Therapy and Topic:  Group Therapy - Healthy vs Unhealthy Coping Skills  Participation Level:  Minimal   Description of Group The focus of this group was to determine what unhealthy coping techniques typically are used by group members and what healthy coping techniques would be helpful in coping with various problems. Patients were guided in becoming aware of the differences between healthy and unhealthy coping techniques. Patients were asked to identify 2-3 healthy coping skills they would like to learn to use more effectively.  Therapeutic Goals Patients learned that coping is what human beings do all day long to deal with various situations in their lives Patients defined and discussed healthy vs unhealthy coping techniques Patients identified their preferred coping techniques and identified whether these were healthy or unhealthy Patients determined 2-3 healthy coping skills they would like to become more familiar with and use more often. Patients provided support and ideas to each other   Summary of Patient Progress:  Patient was present for the entirety of group session. Patient participated in opening and closing remarks. However, patient did not contribute at all to the topic of discussion despite encouraged participation.    Therapeutic Modalities Cognitive Behavioral Therapy Motivational Interviewing  [name] 12/18/2022  2:37 PM

## 2022-12-18 NOTE — Progress Notes (Signed)
[facility] MD Progress Note  12/18/2022 11:05 AM Adam [name]  MRN:  [id number] Subjective: Follow-up patient with schizophrenia.  No new complaints.  No change to behavior.  No aggression no violence no threats.  Taking his medication well.  Seems to be in a reasonably good mood today. 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[place])   Stroke (HCC)   HTN (hypertension)   HLD (hyperlipidemia)   CAD (coronary artery disease)   Chronic systolic CHF (congestive heart failure) (HCC)   Anxiety   Infection of left earlobe  Total Time spent with patient: 30 minutes  Past Psychiatric History: Past history of schizophrenia and stroke with chronic aphasia  Past Medical History:  Past Medical History:  Diagnosis Date   Myocardial infarction ([place])    Schizophrenia ([place])    Stroke ([place])     Past Surgical History:  Procedure Laterality Date   NO PAST SURGERIES     Family History:  Family History  Problem Relation Age of Onset   Hypertension Mother    Family Psychiatric  History: See previous Social History:  Social History   Substance and Sexual Activity  Alcohol Use Not Currently     Social History   Substance and Sexual Activity  Drug Use Not Currently    Social History   Socioeconomic History   Marital status: Single    Spouse name: Not on file   Number of children: Not on file   Years of education: Not on file   Highest education level: Not on file  Occupational History   Not on file  Tobacco Use   Smoking status: Never    Passive exposure: Never   Smokeless tobacco: Never  Vaping Use   Vaping Use: Unknown  Substance and Sexual Activity   Alcohol use: Not Currently   Drug use: Not Currently   Sexual activity: Not Currently  Other Topics Concern   Not  on file  Social History Narrative   Not on file   Social Determinants of Health   Financial Resource Strain: Not on file  Food Insecurity: Not on file  Transportation Needs: Not on file  Physical Activity: Not on file  Stress: Not on file  Social Connections: Not on file   Additional Social History:  Specify valuables returned:  (none)                      Sleep: Fair  Appetite:  Fair  Current Medications: Current Facility-Administered Medications  Medication Dose Route Frequency Provider Last Rate Last Admin   acetaminophen (TYLENOL) tablet 650 mg  650 mg Oral Q6H PRN Divon Krabill T, MD   650 mg at 12/18/22 0813   alum & mag hydroxide-simeth (MAALOX/MYLANTA) 200-200-20 MG/5ML suspension 30 mL  30 mL Oral Q4H PRN Artem Bunte T, MD   30 mL at 04/26/22 4098   aspirin EC tablet 81 mg  81 mg Oral Daily Lilian Fuhs, [name], MD   81 mg at 12/18/22 0813   atorvastatin (LIPITOR) tablet 20 mg  20 mg Oral Daily Emeric Novinger T, MD   20 mg at 12/18/22 0813   atropine 1 % ophthalmic solution 1 drop  1 drop Sublingual QID [name], MD   1 drop at 12/18/22 0816   benztropine (COGENTIN) tablet 0.5 mg  0.5 mg Oral BID Vickie Ponds  T, MD   0.5 mg at 12/18/22 0813   clonazePAM (KLONOPIN) tablet 1 mg  1 mg Oral TID PRN He, [name], MD   1 mg at 12/14/22 1709   cloZAPine (CLOZARIL) tablet 300 mg  300 mg Oral QHS Jannice Beitzel T, MD   300 mg at 12/17/22 2118   diphenhydrAMINE (BENADRYL) capsule 50 mg  50 mg Oral Q6H PRN Travez Stancil, [name], MD   50 mg at 12/14/22 1709   Or   diphenhydrAMINE (BENADRYL) injection 50 mg  50 mg Intramuscular Q6H PRN Analisa Sledd, [name], MD       feeding supplement (ENSURE ENLIVE / ENSURE PLUS) liquid 237 mL  237 mL Oral BID BM Chirstine Defrain T, MD   237 mL at 12/18/22 1042   gabapentin (NEURONTIN) capsule 300 mg  300 mg Oral TID Quanita Barona, [name], MD   300 mg at 12/18/22 0947   haloperidol (HALDOL) tablet 2 mg  2 mg Oral TID Magnus Crescenzo, [name], MD   2 mg at 12/18/22 0947    haloperidol (HALDOL) tablet 5 mg  5 mg Oral Q6H PRN Azra Abrell, [name], MD   5 mg at 12/14/22 1709   Or   haloperidol lactate (HALDOL) injection 5 mg  5 mg Intramuscular Q6H PRN Tahliyah Anagnos, [name], MD       lisinopril (ZESTRIL) tablet 5 mg  5 mg Oral Daily Lacreshia Bondarenko, [name], MD   5 mg at 12/18/22 0813   magnesium hydroxide (MILK OF MAGNESIA) suspension 30 mL  30 mL Oral Daily PRN Daysha Ashmore, [name], MD   30 mL at 12/03/22 1526   metoprolol succinate (TOPROL-XL) 24 hr tablet 25 mg  25 mg Oral Daily Elad Macphail T, MD   25 mg at 12/18/22 0813   ondansetron (ZOFRAN) tablet 4 mg  4 mg Oral Q8H PRN Jearld Lesch, NP   4 mg at 09/06/22 2320   paliperidone (INVEGA SUSTENNA) injection 156 mg  156 mg Intramuscular Q28 days Kenzlee Fishburn, [name], MD   156 mg at 11/20/22 1741   paliperidone (INVEGA) 24 hr tablet 6 mg  6 mg Oral QHS Aiyla Baucom T, MD   6 mg at 12/17/22 2117   senna-docusate (Senokot-S) tablet 2 tablet  2 tablet Oral QHS PRN Sarina Ill, DO   2 tablet at 09/15/22 2110   temazepam (RESTORIL) capsule 15 mg  15 mg Oral QHS Effrey Davidow T, MD   15 mg at 12/17/22 2118   ziprasidone (GEODON) injection 20 mg  20 mg Intramuscular Q12H PRN Shakeisha Horine, [name], MD   20 mg at 07/08/22 1453    Lab Results:  Results for orders placed or performed during the hospital encounter of 03/19/22 (from the past 48 hour(s))  CBC with Differential/Platelet     Status: Abnormal   Collection Time: 12/16/22 11:40 AM  Result Value Ref Range   WBC 5.8 4.0 - 10.5 K/uL   RBC 4.61 4.22 - 5.81 MIL/uL   Hemoglobin 14.0 13.0 - 17.0 g/dL   HCT 29.5 18.8 - 41.6 %   MCV 90.0 80.0 - 100.0 fL   MCH 30.4 26.0 - 34.0 pg   MCHC 33.7 30.0 - 36.0 g/dL   RDW 60.6 30.1 - 60.1 %   Platelets 179 150 - 400 K/uL   nRBC 0.0 0.0 - 0.2 %   Neutrophils Relative % 66 %   Neutro Abs 3.9 1.7 - 7.7 K/uL   Lymphocytes Relative 21 %   Lymphs Abs  1.2 0.7 - 4.0 K/uL   Monocytes Relative 10 %   Monocytes Absolute 0.6 0.1 - 1.0 K/uL    Eosinophils Relative 0 %   Eosinophils Absolute 0.0 0.0 - 0.5 K/uL   Basophils Relative 3 %   Basophils Absolute 0.2 (H) 0.0 - 0.1 K/uL   Immature Granulocytes 0 %   Abs Immature Granulocytes 0.02 0.00 - 0.07 K/uL    Comment: Performed at [facility], [address] [place], [place] [address]    Blood Alcohol level:  Lab Results  Component Value Date   [facility] <10 60/45/4098    Metabolic Disorder Labs: Lab Results  Component Value Date   HGBA1C 5.5 04/10/2022   MPG 111.15 04/10/2022   No results found for: "PROLACTIN" Lab Results  Component Value Date   CHOL 167 11/20/2021   TRIG 107 11/20/2021   HDL 26 (L) 11/20/2021   CHOLHDL 6.4 11/20/2021   VLDL 21 11/20/2021   LDLCALC 120 (H) 11/20/2021    Physical Findings: AIMS: Facial and Oral Movements Muscles of Facial Expression: None, normal Lips and Perioral Area: None, normal Jaw: None, normal Tongue: None, normal,Extremity Movements Upper (arms, wrists, hands, fingers): None, normal Lower (legs, knees, ankles, toes): None, normal, Trunk Movements Neck, shoulders, hips: None, normal, Overall Severity Severity of abnormal movements (highest score from questions above): None, normal Incapacitation due to abnormal movements: None, normal Patient's awareness of abnormal movements (rate only patient's report): No Awareness, Dental Status Current problems with teeth and/or dentures?: No Does patient usually wear dentures?: No  CIWA:    COWS:     Musculoskeletal: Strength & Muscle Tone: within normal limits Gait & Station: normal Patient leans: N/A  Psychiatric Specialty Exam:  Presentation  General Appearance:  Appropriate for Environment; Casual; Neat; Well Groomed  Eye Contact: Fair  Speech: Slow  Speech Volume: Decreased  Handedness: Left   Mood and Affect  Mood: Anxious  Affect: Congruent   Thought Process  Thought Processes: Goal Directed  Descriptions of  Associations:Circumstantial  Orientation:Partial  Thought Content:Scattered  History of Schizophrenia/Schizoaffective disorder:Yes  Duration of Psychotic Symptoms:Greater than six months  Hallucinations:No data recorded Ideas of Reference:None  Suicidal Thoughts:No data recorded Homicidal Thoughts:No data recorded  Sensorium  Memory: Remote Good  Judgment: Fair  Insight: Fair   Community education officer  Concentration: Fair  Attention Span: Fair  Recall: [employer] of Knowledge: Fair  Language: Fair   Psychomotor Activity  Psychomotor Activity:No data recorded  Assets  Assets:No data recorded  Sleep  Sleep:No data recorded   Physical Exam: Physical Exam Vitals and nursing note reviewed.  Constitutional:      Appearance: Normal appearance.  HENT:     Head: Normocephalic and atraumatic.     Mouth/Throat:     Pharynx: Oropharynx is clear.  Eyes:     Pupils: Pupils are equal, round, and reactive to light.  Cardiovascular:     Rate and Rhythm: Normal rate and regular rhythm.  Pulmonary:     Effort: Pulmonary effort is normal.     Breath sounds: Normal breath sounds.  Abdominal:     General: Abdomen is flat.     Palpations: Abdomen is soft.  Musculoskeletal:        General: Normal range of motion.  Skin:    General: Skin is warm and dry.  Neurological:     General: No focal deficit present.     Mental Status: He is alert. Mental status is at baseline.  Psychiatric:  Attention and Perception: He is inattentive.        Mood and Affect: Mood normal. Affect is blunt.        Speech: He is noncommunicative.    Review of Systems  Unable to perform ROS: Language   Blood pressure 117/80, pulse 94, temperature 98 F (36.7 C), temperature source Oral, resp. rate 20, height 5\' 8"  (1.727 m), weight 85.8 kg, SpO2 97 %. Body mass index is 28.76 kg/m.   Treatment Plan Summary: Medication management and Plan no change to medication.  No change  to overall treatment plan.  We are entirely focused still on discharge planning in this patient who is quite stable.  , MD 12/18/2022, 11:05 AM

## 2022-12-19 NOTE — Group Note (Signed)
[facility] LCSW Group Therapy Note   Group Date: 12/19/2022 Start Time: 1300 End Time: 1400   Type of Therapy/Topic:  Group Therapy:  Emotion Regulation  Participation Level:  None   Mood:  Description of Group:    The purpose of this group is to assist patients in learning to regulate negative emotions and experience positive emotions. Patients will be guided to discuss ways in which they have been vulnerable to their negative emotions. These vulnerabilities will be juxtaposed with experiences of positive emotions or situations, and patients challenged to use positive emotions to combat negative ones. Special emphasis will be placed on coping with negative emotions in conflict situations, and patients will process healthy conflict resolution skills.  Therapeutic Goals: Patient will identify two positive emotions or experiences to reflect on in order to balance out negative emotions:  Patient will label two or more emotions that they find the most difficult to experience:  Patient will be able to demonstrate positive conflict resolution skills through discussion or role plays:   Summary of Patient Progress: Patient was present in group.  Patient did not participate in group discussions, however, was attentive.   Therapeutic Modalities:   Cognitive Behavioral Therapy Feelings Identification Dialectical Behavioral Therapy   Rozann Lesches, LCSW

## 2022-12-19 NOTE — Progress Notes (Signed)
Recreation Therapy Notes  Date: 12/19/2022   Time: 10:25 am   Location: Craft room      Behavioral response: N/A   Intervention Topic: Stress Management    Discussion/Intervention: Patient refused to attend group.    Clinical Observations/Feedback:  Patient refused to attend group.    Doha Boling LRT/CTRS        Aliyana Dlugosz 12/19/2022 11:58 AM

## 2022-12-19 NOTE — Plan of Care (Signed)
D: Patient alert and oriented. Patient endorses right leg pain. PRN tylenol provided. Patient denies anxiety and depression. Patient denies SI/HI/AVH. Patient interacts at baseline with staff and peers on the unit.   A: Scheduled medications administered to patient, per MD orders.  Support and encouragement provided to patient.  Q15 minute safety checks maintained.   R: Patient compliant with medication administration and treatment plan. No adverse drug reactions noted. Patient remains safe on the unit at this time. Problem: Education: Goal: Knowledge of General Education information will improve Description: Including pain rating scale, medication(s)/side effects and non-pharmacologic comfort measures Outcome: Progressing   Problem: Clinical Measurements: Goal: Ability to maintain clinical measurements within normal limits will improve Outcome: Progressing Goal: Will remain free from infection Outcome: Progressing   Problem: Nutrition: Goal: Adequate nutrition will be maintained Outcome: Progressing   Problem: Education: Goal: Will be free of psychotic symptoms Outcome: Progressing

## 2022-12-19 NOTE — BHH Counselor (Signed)
CSW team met with [facility] care coordinator, Delle Reining., along with leadership team Guinevere Ferrari., Mikael Spray and Velva Harman.) regarding case progression.   Case was discussed. Lots of questions around [facility] responsibilities and tentative discharge dates were posed. Currently no one has heard back from [facility] despite the interview seeming to go well. [facility] CSW team updated everyone else that [facility] had declined pt but that they [employer]) were going to check with their sister facilities. Team will be following up regarding potential placement. No other concerns expressed. Contact ended without incident.    [name]. Guerry Bruin, MSW, LCSW, [place] 12/19/2022 1:37 PM

## 2022-12-19 NOTE — Progress Notes (Signed)
Patient calm and pleasant during assessment. Pt denies SI/HI/AVH. Pt observed interacting appropriately with staff and peers on the unit. Pt compliant with medication administration per MD orders. Pt given education, support, and encouragement to be active in his treatment plan. Pt being monitored Q 15 minutes for safety per unit protocol, remains safe on the unit

## 2022-12-19 NOTE — Progress Notes (Signed)
[facility] MD Progress Note  12/19/2022 10:19 AM Adam [name]  MRN:  [id number] Subjective: Follow-up this patient with schizophrenia and aphasia.  Patient seems to be a little bit grumpy this morning but not violent or threatening.  He had a new phone number written down that he wanted to call and was trying to get someone's attention to turn the phone on for him.  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)   Stroke (HCC)   HTN (hypertension)   HLD (hyperlipidemia)   CAD (coronary artery disease)   Chronic systolic CHF (congestive heart failure) (HCC)   Anxiety   Infection of left earlobe  Total Time spent with patient: 30 minutes  Past Psychiatric History: Past history of schizophrenia stroke and aphasia  Past Medical History:  Past Medical History:  Diagnosis Date   Myocardial infarction (HCC)    Schizophrenia (HCC)    Stroke (HCC)     Past Surgical History:  Procedure Laterality Date   NO PAST SURGERIES     Family History:  Family History  Problem Relation Age of Onset   Hypertension Mother    Family Psychiatric  History: See previous Social History:  Social History   Substance and Sexual Activity  Alcohol Use Not Currently     Social History   Substance and Sexual Activity  Drug Use Not Currently    Social History   Socioeconomic History   Marital status: Single    Spouse name: Not on file   Number of children: Not on file   Years of education: Not on file   Highest education level: Not on file  Occupational History   Not on file  Tobacco Use   Smoking status: Never    Passive exposure: Never   Smokeless tobacco: Never  Vaping Use   Vaping Use: Unknown  Substance and Sexual  Activity   Alcohol use: Not Currently   Drug use: Not Currently   Sexual activity: Not Currently  Other Topics Concern   Not on file  Social History Narrative   Not on file   Social Determinants of Health   Financial Resource Strain: Not on file  Food Insecurity: Not on file  Transportation Needs: Not on file  Physical Activity: Not on file  Stress: Not on file  Social Connections: Not on file   Additional Social History:  Specify valuables returned:  (none)                      Sleep: Fair  Appetite:  Fair  Current Medications: Current Facility-Administered Medications  Medication Dose Route Frequency Provider Last Rate Last Admin   acetaminophen (TYLENOL) tablet 650 mg  650 mg Oral Q6H PRN Rahkim Rabalais T, MD   650 mg at 12/19/22 0837   alum & mag hydroxide-simeth (MAALOX/MYLANTA) 200-200-20 MG/5ML suspension 30 mL  30 mL Oral Q4H PRN Shamiyah Ngu T, MD   30 mL at 04/26/22 0102   aspirin EC tablet 81 mg  81 mg Oral Daily Artie Takayama, [name], MD   81 mg at 12/19/22 0837   atorvastatin (LIPITOR) tablet 20 mg  20 mg Oral Daily Kaylianna Detert T, MD   20 mg at 12/19/22 0837   atropine 1 % ophthalmic solution 1 drop  1 drop Sublingual  QID [name], MD   1 drop at 12/19/22 1610   benztropine (COGENTIN) tablet 0.5 mg  0.5 mg Oral BID Burnette Valenti, [name], MD   0.5 mg at 12/19/22 9604   clonazePAM (KLONOPIN) tablet 1 mg  1 mg Oral TID PRN He, [name], MD   1 mg at 12/14/22 1709   cloZAPine (CLOZARIL) tablet 300 mg  300 mg Oral QHS Taheem Fricke T, MD   300 mg at 12/18/22 2057   diphenhydrAMINE (BENADRYL) capsule 50 mg  50 mg Oral Q6H PRN Antionio Negron T, MD   50 mg at 12/14/22 1709   Or   diphenhydrAMINE (BENADRYL) injection 50 mg  50 mg Intramuscular Q6H PRN Arhum Peeples T, MD       feeding supplement (ENSURE ENLIVE / ENSURE PLUS) liquid 237 mL  237 mL Oral BID BM Reianna Batdorf T, MD   237 mL at 12/18/22 1419   gabapentin (NEURONTIN) capsule 300 mg  300 mg Oral TID Etha Stambaugh  T, MD   300 mg at 12/18/22 2057   haloperidol (HALDOL) tablet 2 mg  2 mg Oral TID Kaivon Livesey T, MD   2 mg at 12/18/22 2057   haloperidol (HALDOL) tablet 5 mg  5 mg Oral Q6H PRN Tarri Guilfoil, [name], MD   5 mg at 12/14/22 1709   Or   haloperidol lactate (HALDOL) injection 5 mg  5 mg Intramuscular Q6H PRN Letia Guidry T, MD       lisinopril (ZESTRIL) tablet 5 mg  5 mg Oral Daily Cadie Sorci T, MD   5 mg at 12/19/22 5409   magnesium hydroxide (MILK OF MAGNESIA) suspension 30 mL  30 mL Oral Daily PRN Collan Schoenfeld T, MD   30 mL at 12/03/22 1526   metoprolol succinate (TOPROL-XL) 24 hr tablet 25 mg  25 mg Oral Daily Alaric Gladwin T, MD   25 mg at 12/19/22 0837   ondansetron (ZOFRAN) tablet 4 mg  4 mg Oral Q8H PRN [name], NP   4 mg at 09/06/22 2320   paliperidone (INVEGA SUSTENNA) injection 156 mg  156 mg Intramuscular Q28 days Krystle Oberman T, MD   156 mg at 12/18/22 1654   paliperidone (INVEGA) 24 hr tablet 6 mg  6 mg Oral QHS Chayna Surratt T, MD   6 mg at 12/18/22 2057   senna-docusate (Senokot-S) tablet 2 tablet  2 tablet Oral QHS PRN [name], DO   2 tablet at 09/15/22 2110   temazepam (RESTORIL) capsule 15 mg  15 mg Oral QHS Ugo Thoma T, MD   15 mg at 12/18/22 2057   ziprasidone (GEODON) injection 20 mg  20 mg Intramuscular Q12H PRN Chisum Habenicht, [name], MD   20 mg at 07/08/22 1453    Lab Results: No results found for this or any previous visit (from the past 42 hour(s)).  Blood Alcohol level:  Lab Results  Component Value Date   ETH <10 81/19/1478    Metabolic Disorder Labs: Lab Results  Component Value Date   HGBA1C 5.5 04/10/2022   MPG 111.15 04/10/2022   No results found for: "PROLACTIN" Lab Results  Component Value Date   CHOL 167 11/20/2021   TRIG 107 11/20/2021   HDL 26 (L) 11/20/2021   CHOLHDL 6.4 11/20/2021   VLDL 21 11/20/2021   LDLCALC 120 (H) 11/20/2021    Physical Findings: AIMS: Facial and Oral Movements Muscles of Facial Expression:  None, normal Lips and Perioral Area: None, normal Jaw:  None, normal Tongue: None, normal,Extremity Movements Upper (arms, wrists, hands, fingers): None, normal Lower (legs, knees, ankles, toes): None, normal, Trunk Movements Neck, shoulders, hips: None, normal, Overall Severity Severity of abnormal movements (highest score from questions above): None, normal Incapacitation due to abnormal movements: None, normal Patient's awareness of abnormal movements (rate only patient's report): No Awareness, Dental Status Current problems with teeth and/or dentures?: No Does patient usually wear dentures?: No  CIWA:    COWS:     Musculoskeletal: Strength & Muscle Tone: within normal limits Gait & Station: normal Patient leans: N/A  Psychiatric Specialty Exam:  Presentation  General Appearance:  Appropriate for Environment; Casual; Neat; Well Groomed  Eye Contact: Fair  Speech: Slow  Speech Volume: Decreased  Handedness: Left   Mood and Affect  Mood: Anxious  Affect: Congruent   Thought Process  Thought Processes: Goal Directed  Descriptions of Associations:Circumstantial  Orientation:Partial  Thought Content:Scattered  History of Schizophrenia/Schizoaffective disorder:Yes  Duration of Psychotic Symptoms:Greater than six months  Hallucinations:No data recorded Ideas of Reference:None  Suicidal Thoughts:No data recorded Homicidal Thoughts:No data recorded  Sensorium  Memory: Remote Good  Judgment: Fair  Insight: Fair   Community education officer  Concentration: Fair  Attention Span: Fair  Recall: [employer] of Knowledge: Fair  Language: Fair   Psychomotor Activity  Psychomotor Activity:No data recorded  Assets  Assets:No data recorded  Sleep  Sleep:No data recorded   Physical Exam: Physical Exam Vitals and nursing note reviewed.  Constitutional:      Appearance: Normal appearance.  HENT:     Head: Normocephalic and atraumatic.      Mouth/Throat:     Pharynx: Oropharynx is clear.  Eyes:     Pupils: Pupils are equal, round, and reactive to light.  Cardiovascular:     Rate and Rhythm: Normal rate and regular rhythm.  Pulmonary:     Effort: Pulmonary effort is normal.     Breath sounds: Normal breath sounds.  Abdominal:     General: Abdomen is flat.     Palpations: Abdomen is soft.  Musculoskeletal:        General: Normal range of motion.  Skin:    General: Skin is warm and dry.  Neurological:     General: No focal deficit present.     Mental Status: He is alert. Mental status is at baseline.  Psychiatric:        Attention and Perception: He is inattentive.        Mood and Affect: Affect is blunt and inappropriate.        Speech: He is noncommunicative.    Review of Systems  Unable to perform ROS: Language   Blood pressure 113/82, pulse 97, temperature 98.3 F (36.8 C), resp. rate 20, height 5\' 8"  (1.727 m), weight 85.8 kg, SpO2 100 %. Body mass index is 28.76 kg/m.   Treatment Plan Summary: Medication management and Plan no change to medication.  As always attempts supportive encouragement.  Still looking at discharge planning.  Alethia Berthold, MD 12/19/2022, 10:19 AM

## 2022-12-20 NOTE — Group Note (Signed)
[facility] LCSW Group Therapy Note   Group Date: 12/20/2022 Start Time: 1300 End Time: 1400   Type of Therapy/Topic:  Group Therapy:  Balance in Life  Participation Level:  None   Description of Group:    This group will address the concept of balance and how it feels and looks when one is unbalanced. Patients will be encouraged to process areas in their lives that are out of balance, and identify reasons for remaining unbalanced. Facilitators will guide patients utilizing problem- solving interventions to address and correct the stressor making their life unbalanced. Understanding and applying boundaries will be explored and addressed for obtaining  and maintaining a balanced life. Patients will be encouraged to explore ways to assertively make their unbalanced needs known to significant others in their lives, using other group members and facilitator for support and feedback.  Therapeutic Goals: Patient will identify two or more emotions or situations they have that consume much of in their lives. Patient will identify signs/triggers that life has become out of balance:  Patient will identify two ways to set boundaries in order to achieve balance in their lives:  Patient will demonstrate ability to communicate their needs through discussion and/or role plays  Summary of Patient Progress: Patient was in and out of the group room. When called on during check-in pt did not respond. Pt did not interject into the larger discussion.    Therapeutic Modalities:   Cognitive Behavioral Therapy Solution-Focused Therapy Assertiveness Training   Shirl Harris, LCSW

## 2022-12-20 NOTE — [facility] Group Notes (Signed)
[facility] Group Notes:  (Nursing/MHT/Case Management/Adjunct)  Date:  12/20/2022  Time:  10:00 PM  Type of Therapy:   Wrap up  Participation Level:  Minimal  Participation Quality:  Appropriate  Affect:  Appropriate  Cognitive:  Alert  Insight:  Good  Engagement in Group:  Engaged and he said his name.  Modes of Intervention:  Support  Summary of Progress/Problems:  Adam [name] 12/20/2022, 10:00 PM

## 2022-12-20 NOTE — Plan of Care (Signed)
  Problem: Self-Concept: Goal: Level of anxiety will decrease Outcome: Progressing Goal: Ability to modify response to factors that promote anxiety will improve Outcome: Progressing   Problem: Education: Goal: Knowledge of General Education information will improve Description: Including pain rating scale, medication(s)/side effects and non-pharmacologic comfort measures Outcome: Progressing   Problem: Clinical Measurements: Goal: Ability to maintain clinical measurements within normal limits will improve Outcome: Progressing

## 2022-12-20 NOTE — Progress Notes (Signed)
[facility] MD Progress Note  12/20/2022 2:13 PM Adam [name]  MRN:  [id number] Subjective: Follow-up for this 62 year old man with schizophrenia.  No change presentation no change to behavior. 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)   Stroke (HCC)   HTN (hypertension)   HLD (hyperlipidemia)   CAD (coronary artery disease)   Chronic systolic CHF (congestive heart failure) (HCC)   Anxiety   Infection of left earlobe  Total Time spent with patient: 15 minutes  Past Psychiatric History: Past history of schizophrenia and stroke with aphasia  Past Medical History:  Past Medical History:  Diagnosis Date   Myocardial infarction ([place])    Schizophrenia ([place])    Stroke ([place])     Past Surgical History:  Procedure Laterality Date   NO PAST SURGERIES     Family History:  Family History  Problem Relation Age of Onset   Hypertension Mother    Family Psychiatric  History: See previous Social History:  Social History   Substance and Sexual Activity  Alcohol Use Not Currently     Social History   Substance and Sexual Activity  Drug Use Not Currently    Social History   Socioeconomic History   Marital status: Single    Spouse name: Not on file   Number of children: Not on file   Years of education: Not on file   Highest education level: Not on file  Occupational History   Not on file  Tobacco Use   Smoking status: Never    Passive exposure: Never   Smokeless tobacco: Never  Vaping Use   Vaping Use: Unknown  Substance and Sexual Activity   Alcohol use: Not Currently   Drug use: Not Currently   Sexual activity: Not Currently  Other Topics Concern   Not on file  Social History Narrative   Not on file   Social Determinants of Health   Financial  Resource Strain: Not on file  Food Insecurity: Not on file  Transportation Needs: Not on file  Physical Activity: Not on file  Stress: Not on file  Social Connections: Not on file   Additional Social History:  Specify valuables returned:  (none)                      Sleep: Fair  Appetite:  Fair  Current Medications: Current Facility-Administered Medications  Medication Dose Route Frequency Provider Last Rate Last Admin   acetaminophen (TYLENOL) tablet 650 mg  650 mg Oral Q6H PRN Ziyad Dyar T, MD   650 mg at 12/20/22 0851   alum & mag hydroxide-simeth (MAALOX/MYLANTA) 200-200-20 MG/5ML suspension 30 mL  30 mL Oral Q4H PRN Tonnya Garbett T, MD   30 mL at 04/26/22 4098   aspirin EC tablet 81 mg  81 mg Oral Daily Jacelynn Hayton, [name], MD   81 mg at 12/20/22 0849   atorvastatin (LIPITOR) tablet 20 mg  20 mg Oral Daily Naamah Boggess T, MD   20 mg at 12/20/22 0849   atropine 1 % ophthalmic solution 1 drop  1 drop Sublingual QID [name], MD   1 drop at 12/20/22 1135   benztropine (COGENTIN) tablet 0.5 mg  0.5 mg Oral BID Sylvi Rybolt T, MD   0.5 mg at 12/20/22 0849   clonazePAM (KLONOPIN) tablet 1 mg  1 mg Oral TID  PRN He, [name], MD   1 mg at 12/14/22 1709   cloZAPine (CLOZARIL) tablet 300 mg  300 mg Oral QHS Javonda Suh T, MD   300 mg at 12/19/22 2148   diphenhydrAMINE (BENADRYL) capsule 50 mg  50 mg Oral Q6H PRN Alexiya Franqui T, MD   50 mg at 12/14/22 1709   Or   diphenhydrAMINE (BENADRYL) injection 50 mg  50 mg Intramuscular Q6H PRN Leesha Veno T, MD       feeding supplement (ENSURE ENLIVE / ENSURE PLUS) liquid 237 mL  237 mL Oral BID BM Marti Acebo T, MD   237 mL at 12/20/22 1400   gabapentin (NEURONTIN) capsule 300 mg  300 mg Oral TID Leondre Taul T, MD   300 mg at 12/20/22 0851   haloperidol (HALDOL) tablet 2 mg  2 mg Oral TID Darris Carachure, [name], MD   2 mg at 12/20/22 0851   haloperidol (HALDOL) tablet 5 mg  5 mg Oral Q6H PRN Marcos Ruelas, [name], MD   5 mg at 12/14/22 1709    Or   haloperidol lactate (HALDOL) injection 5 mg  5 mg Intramuscular Q6H PRN Neftali Abair T, MD       lisinopril (ZESTRIL) tablet 5 mg  5 mg Oral Daily Milissa Fesperman T, MD   5 mg at 12/19/22 1191   magnesium hydroxide (MILK OF MAGNESIA) suspension 30 mL  30 mL Oral Daily PRN Jessi Jessop T, MD   30 mL at 12/03/22 1526   metoprolol succinate (TOPROL-XL) 24 hr tablet 25 mg  25 mg Oral Daily Daily Crate T, MD   25 mg at 12/19/22 0837   ondansetron (ZOFRAN) tablet 4 mg  4 mg Oral Q8H PRN Lerry Liner M, NP   4 mg at 09/06/22 2320   paliperidone (INVEGA SUSTENNA) injection 156 mg  156 mg Intramuscular Q28 days Xeng Kucher T, MD   156 mg at 12/18/22 1654   paliperidone (INVEGA) 24 hr tablet 6 mg  6 mg Oral QHS Nadene Witherspoon T, MD   6 mg at 12/19/22 2148   senna-docusate (Senokot-S) tablet 2 tablet  2 tablet Oral QHS PRN Sarina Ill, DO   2 tablet at 09/15/22 2110   temazepam (RESTORIL) capsule 15 mg  15 mg Oral QHS Vaniya Augspurger T, MD   15 mg at 12/19/22 2147   ziprasidone (GEODON) injection 20 mg  20 mg Intramuscular Q12H PRN Mihailo Sage, [name], MD   20 mg at 07/08/22 1453    Lab Results: No results found for this or any previous visit (from the past 48 hour(s)).  Blood Alcohol level:  Lab Results  Component Value Date   ETH <10 07/20/2021    Metabolic Disorder Labs: Lab Results  Component Value Date   HGBA1C 5.5 04/10/2022   MPG 111.15 04/10/2022   No results found for: "PROLACTIN" Lab Results  Component Value Date   CHOL 167 11/20/2021   TRIG 107 11/20/2021   HDL 26 (L) 11/20/2021   CHOLHDL 6.4 11/20/2021   VLDL 21 11/20/2021   LDLCALC 120 (H) 11/20/2021    Physical Findings: AIMS: Facial and Oral Movements Muscles of Facial Expression: None, normal Lips and Perioral Area: None, normal Jaw: None, normal Tongue: None, normal,Extremity Movements Upper (arms, wrists, hands, fingers): None, normal Lower (legs, knees, ankles, toes): None, normal, Trunk  Movements Neck, shoulders, hips: None, normal, Overall Severity Severity of abnormal movements (highest score from questions above): None, normal Incapacitation due to abnormal movements: None,  normal Patient's awareness of abnormal movements (rate only patient's report): No Awareness, Dental Status Current problems with teeth and/or dentures?: No Does patient usually wear dentures?: No  CIWA:    COWS:     Musculoskeletal: Strength & Muscle Tone: within normal limits Gait & Station: normal Patient leans: N/A  Psychiatric Specialty Exam:  Presentation  General Appearance:  Appropriate for Environment; Casual; Neat; Well Groomed  Eye Contact: Fair  Speech: Slow  Speech Volume: Decreased  Handedness: Left   Mood and Affect  Mood: Anxious  Affect: Congruent   Thought Process  Thought Processes: Goal Directed  Descriptions of Associations:Circumstantial  Orientation:Partial  Thought Content:Scattered  History of Schizophrenia/Schizoaffective disorder:Yes  Duration of Psychotic Symptoms:Greater than six months  Hallucinations:No data recorded Ideas of Reference:None  Suicidal Thoughts:No data recorded Homicidal Thoughts:No data recorded  Sensorium  Memory: Remote Good  Judgment: Fair  Insight: Fair   Community education officer  Concentration: Fair  Attention Span: Fair  Recall: [employer] of Knowledge: Fair  Language: Fair   Psychomotor Activity  Psychomotor Activity:No data recorded  Assets  Assets:No data recorded  Sleep  Sleep:No data recorded   Physical Exam: Physical Exam Vitals and nursing note reviewed.  Constitutional:      Appearance: Normal appearance.  HENT:     Head: Normocephalic and atraumatic.     Mouth/Throat:     Pharynx: Oropharynx is clear.  Eyes:     Pupils: Pupils are equal, round, and reactive to light.  Cardiovascular:     Rate and Rhythm: Normal rate and regular rhythm.  Pulmonary:     Effort:  Pulmonary effort is normal.     Breath sounds: Normal breath sounds.  Abdominal:     General: Abdomen is flat.     Palpations: Abdomen is soft.  Musculoskeletal:        General: Normal range of motion.  Skin:    General: Skin is warm and dry.  Neurological:     General: No focal deficit present.     Mental Status: He is alert. Mental status is at baseline.  Psychiatric:        Attention and Perception: He is inattentive.        Mood and Affect: Mood normal. Affect is blunt.        Speech: He is noncommunicative.    Review of Systems  Unable to perform ROS: Language   Blood pressure 97/76, pulse 94, temperature 98.8 F (37.1 C), temperature source Oral, resp. rate 20, height 5\' 8"  (1.727 m), weight 85.8 kg, SpO2 100 %. Body mass index is 28.76 kg/m.   Treatment Plan Summary: Plan no indication for making any changes to medication or overall treatment.  Entirety of treatment plan continues to be focused on discharge planning  Alethia Berthold, MD 12/20/2022, 2:13 PM

## 2022-12-20 NOTE — Progress Notes (Signed)
Pt denies SI/HI/AVH and verbally agrees to approach staff if these become apparent or before harming themselves/others. Rates depression 0/10. Rates anxiety 0/10. Rates pain on the face scale, 4. Scheduled medications administered to pt, per MD orders. RN provided support and encouragement to pt. Q15 min safety checks implemented and continued. Pt safe on the unit. RN will continue to monitor and intervene as needed.  12/20/22 0851  Psych Admission Type (Psych Patients Only)  Admission Status Involuntary  Psychosocial Assessment  Patient Complaints None  Eye Contact Fair  Facial Expression Animated  Affect Blunted  Speech Aphasic  Interaction Childlike  Motor Activity Restless  Appearance/Hygiene Unremarkable  Behavior Characteristics Cooperative;Appropriate to situation;Calm  Mood Pleasant  Aggressive Behavior  Effect No apparent injury  Thought Process  Coherency WDL  Content Preoccupation  Delusions None reported or observed  Perception WDL  Hallucination None reported or observed  Judgment Impaired  Confusion None  Danger to Self  Current suicidal ideation? Denies  Danger to Others  Danger to Others None reported or observed  Danger to Others Abnormal  Harmful Behavior to others No threats or harm toward other people  Destructive Behavior No threats or harm toward property

## 2022-12-21 NOTE — Group Note (Signed)
LCSW Group Therapy Note  Group Date: 12/21/2022 Start Time: 1300 End Time: 1400   Type of Therapy and Topic:  Group Therapy - Healthy vs Unhealthy Coping Skills  Participation Level:  None   Description of Group The focus of this group was to determine what unhealthy coping techniques typically are used by group members and what healthy coping techniques would be helpful in coping with various problems. Patients were guided in becoming aware of the differences between healthy and unhealthy coping techniques. Patients were asked to identify 2-3 healthy coping skills they would like to learn to use more effectively.  Therapeutic Goals Patients learned that coping is what human beings do all day long to deal with various situations in their lives Patients defined and discussed healthy vs unhealthy coping techniques Patients identified their preferred coping techniques and identified whether these were healthy or unhealthy Patients determined 2-3 healthy coping skills they would like to become more familiar with and use more often. 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   Therapeutic Modalities Cognitive Behavioral Therapy Motivational Interviewing  [name] 12/21/2022  2:50 PM

## 2022-12-21 NOTE — Progress Notes (Signed)
[facility] MD Progress Note  12/21/2022 4:39 PM Adam [name]  MRN:  [id number] Subjective: Follow-up 62 year old male with schizophrenia.  No change to behavior presentation.  Seems to be in a good mood today. 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[place])   Stroke (HCC)   HTN (hypertension)   HLD (hyperlipidemia)   CAD (coronary artery disease)   Chronic systolic CHF (congestive heart failure) (HCC)   Anxiety   Infection of left earlobe  Total Time spent with patient: 30 minutes  Past Psychiatric History: Past history of schizophrenia  Past Medical History:  Past Medical History:  Diagnosis Date   Myocardial infarction ([place])    Schizophrenia ([place])    Stroke ([place])     Past Surgical History:  Procedure Laterality Date   NO PAST SURGERIES     Family History:  Family History  Problem Relation Age of Onset   Hypertension Mother    Family Psychiatric  History: See previous Social History:  Social History   Substance and Sexual Activity  Alcohol Use Not Currently     Social History   Substance and Sexual Activity  Drug Use Not Currently    Social History   Socioeconomic History   Marital status: Single    Spouse name: Not on file   Number of children: Not on file   Years of education: Not on file   Highest education level: Not on file  Occupational History   Not on file  Tobacco Use   Smoking status: Never    Passive exposure: Never   Smokeless tobacco: Never  Vaping Use   Vaping Use: Unknown  Substance and Sexual Activity   Alcohol use: Not Currently   Drug use: Not Currently   Sexual activity: Not Currently  Other Topics Concern   Not on file  Social History Narrative   Not on file   Social Determinants of Health   Financial Resource  Strain: Not on file  Food Insecurity: Not on file  Transportation Needs: Not on file  Physical Activity: Not on file  Stress: Not on file  Social Connections: Not on file   Additional Social History:  Specify valuables returned:  (none)                      Sleep: Fair  Appetite:  Fair  Current Medications: Current Facility-Administered Medications  Medication Dose Route Frequency Provider Last Rate Last Admin   acetaminophen (TYLENOL) tablet 650 mg  650 mg Oral Q6H PRN Kobi Mario T, MD   650 mg at 12/21/22 0915   alum & mag hydroxide-simeth (MAALOX/MYLANTA) 200-200-20 MG/5ML suspension 30 mL  30 mL Oral Q4H PRN Aran Menning T, MD   30 mL at 04/26/22 4098   aspirin EC tablet 81 mg  81 mg Oral Daily Denette Hass, [name], MD   81 mg at 12/21/22 0915   atorvastatin (LIPITOR) tablet 20 mg  20 mg Oral Daily Slevin Gunby T, MD   20 mg at 12/21/22 0915   atropine 1 % ophthalmic solution 1 drop  1 drop Sublingual QID [name], MD   1 drop at 12/21/22 1151   benztropine (COGENTIN) tablet 0.5 mg  0.5 mg Oral BID Damontay Alred T, MD   0.5 mg at 12/21/22 0915   clonazePAM (KLONOPIN) tablet 1 mg  1 mg Oral  TID PRN He, [name], MD   1 mg at 12/14/22 1709   cloZAPine (CLOZARIL) tablet 300 mg  300 mg Oral QHS Quamesha Mullet T, MD   300 mg at 12/20/22 2101   diphenhydrAMINE (BENADRYL) capsule 50 mg  50 mg Oral Q6H PRN Cash Meadow T, MD   50 mg at 12/14/22 1709   Or   diphenhydrAMINE (BENADRYL) injection 50 mg  50 mg Intramuscular Q6H PRN Marabeth Melland T, MD       feeding supplement (ENSURE ENLIVE / ENSURE PLUS) liquid 237 mL  237 mL Oral BID BM Mackie Holness T, MD   237 mL at 12/21/22 1518   gabapentin (NEURONTIN) capsule 300 mg  300 mg Oral TID Jousha Schwandt T, MD   300 mg at 12/21/22 1518   haloperidol (HALDOL) tablet 2 mg  2 mg Oral TID Aiva Miskell, [name], MD   2 mg at 12/21/22 1518   haloperidol (HALDOL) tablet 5 mg  5 mg Oral Q6H PRN Zyire Eidson, [name], MD   5 mg at 12/14/22 1709   Or    haloperidol lactate (HALDOL) injection 5 mg  5 mg Intramuscular Q6H PRN Corry Storie T, MD       lisinopril (ZESTRIL) tablet 5 mg  5 mg Oral Daily Demarie Uhlig T, MD   5 mg at 12/21/22 8101   magnesium hydroxide (MILK OF MAGNESIA) suspension 30 mL  30 mL Oral Daily PRN Koltyn Kelsay T, MD   30 mL at 12/03/22 1526   metoprolol succinate (TOPROL-XL) 24 hr tablet 25 mg  25 mg Oral Daily Luanna Weesner T, MD   25 mg at 12/21/22 0833   ondansetron (ZOFRAN) tablet 4 mg  4 mg Oral Q8H PRN [name], NP   4 mg at 09/06/22 2320   paliperidone (INVEGA SUSTENNA) injection 156 mg  156 mg Intramuscular Q28 days Judy Goodenow T, MD   156 mg at 12/18/22 1654   paliperidone (INVEGA) 24 hr tablet 6 mg  6 mg Oral QHS Dashanti Burr T, MD   6 mg at 12/20/22 2102   senna-docusate (Senokot-S) tablet 2 tablet  2 tablet Oral QHS PRN [name], DO   2 tablet at 09/15/22 2110   temazepam (RESTORIL) capsule 15 mg  15 mg Oral QHS Ezechiel Stooksbury T, MD   15 mg at 12/20/22 2101   ziprasidone (GEODON) injection 20 mg  20 mg Intramuscular Q12H PRN Roxanne Orner, [name], MD   20 mg at 07/08/22 1453    Lab Results: No results found for this or any previous visit (from the past 56 hour(s)).  Blood Alcohol level:  Lab Results  Component Value Date   ETH <10 75/09/2584    Metabolic Disorder Labs: Lab Results  Component Value Date   HGBA1C 5.5 04/10/2022   MPG 111.15 04/10/2022   No results found for: "PROLACTIN" Lab Results  Component Value Date   CHOL 167 11/20/2021   TRIG 107 11/20/2021   HDL 26 (L) 11/20/2021   CHOLHDL 6.4 11/20/2021   VLDL 21 11/20/2021   LDLCALC 120 (H) 11/20/2021    Physical Findings: AIMS: Facial and Oral Movements Muscles of Facial Expression: None, normal Lips and Perioral Area: None, normal Jaw: None, normal Tongue: None, normal,Extremity Movements Upper (arms, wrists, hands, fingers): None, normal Lower (legs, knees, ankles, toes): None, normal, Trunk Movements Neck,  shoulders, hips: None, normal, Overall Severity Severity of abnormal movements (highest score from questions above): None, normal Incapacitation due to abnormal movements:  None, normal Patient's awareness of abnormal movements (rate only patient's report): No Awareness, Dental Status Current problems with teeth and/or dentures?: No Does patient usually wear dentures?: No  CIWA:    COWS:     Musculoskeletal: Strength & Muscle Tone: within normal limits Gait & Station: normal Patient leans: N/A  Psychiatric Specialty Exam:  Presentation  General Appearance:  Appropriate for Environment; Casual; Neat; Well Groomed  Eye Contact: Fair  Speech: Slow  Speech Volume: Decreased  Handedness: Left   Mood and Affect  Mood: Anxious  Affect: Congruent   Thought Process  Thought Processes: Goal Directed  Descriptions of Associations:Circumstantial  Orientation:Partial  Thought Content:Scattered  History of Schizophrenia/Schizoaffective disorder:Yes  Duration of Psychotic Symptoms:Greater than six months  Hallucinations:No data recorded Ideas of Reference:None  Suicidal Thoughts:No data recorded Homicidal Thoughts:No data recorded  Sensorium  Memory: Remote Good  Judgment: Fair  Insight: Fair   Art therapist  Concentration: Fair  Attention Span: Fair  Recall: [employer] of Knowledge: Fair  Language: Fair   Psychomotor Activity  Psychomotor Activity:No data recorded  Assets  Assets:No data recorded  Sleep  Sleep:No data recorded   Physical Exam: Physical Exam Vitals and nursing note reviewed.  Constitutional:      Appearance: Normal appearance.  HENT:     Head: Normocephalic and atraumatic.     Mouth/Throat:     Pharynx: Oropharynx is clear.  Eyes:     Pupils: Pupils are equal, round, and reactive to light.  Cardiovascular:     Rate and Rhythm: Normal rate and regular rhythm.  Pulmonary:     Effort: Pulmonary effort  is normal.     Breath sounds: Normal breath sounds.  Abdominal:     General: Abdomen is flat.     Palpations: Abdomen is soft.  Musculoskeletal:        General: Normal range of motion.  Skin:    General: Skin is warm and dry.  Neurological:     General: No focal deficit present.     Mental Status: He is alert. Mental status is at baseline.  Psychiatric:        Attention and Perception: He is inattentive.        Mood and Affect: Affect is blunt.        Speech: He is noncommunicative.    Review of Systems  Unable to perform ROS: Language   Blood pressure 114/87, pulse (!) 111, temperature (!) 97.5 F (36.4 C), temperature source Oral, resp. rate 20, height 5\' 8"  (1.727 m), weight 85.8 kg, SpO2 98 %. Body mass index is 28.76 kg/m.   Treatment Plan Summary: Plan no change to plan seeking placement  , MD 12/21/2022, 4:39 PM

## 2022-12-21 NOTE — Progress Notes (Signed)
Pt denies SI/HI/AVH and verbally agrees to approach staff if these become apparent or before harming themselves/others. Rates depression 0/10. Rates anxiety 0/10. Rates pain on face scale 4. Scheduled medications administered to pt, per MD orders. RN provided support and encouragement to pt. Q15 min safety checks implemented and continued. Pt safe on the unit. RN will continue to monitor and intervene as needed.  12/21/22 0915  Psych Admission Type (Psych Patients Only)  Admission Status Involuntary  Psychosocial Assessment  Patient Complaints None  Eye Contact Fair  Facial Expression Animated;Angry  Affect Labile  Speech Aphasic  Interaction Childlike  Motor Activity Restless  Appearance/Hygiene Unremarkable  Behavior Characteristics Cooperative;Appropriate to situation;Calm  Mood Pleasant;Labile;Anxious  Aggressive Behavior  Effect No apparent injury  Thought Process  Coherency WDL  Content Preoccupation  Delusions None reported or observed  Perception WDL  Hallucination None reported or observed  Judgment Impaired  Confusion None  Danger to Self  Current suicidal ideation? Denies  Danger to Others  Danger to Others None reported or observed  Danger to Others Abnormal  Harmful Behavior to others No threats or harm toward other people  Destructive Behavior No threats or harm toward property

## 2022-12-21 NOTE — Plan of Care (Signed)
  Problem: Pain Managment: Goal: General experience of comfort will improve Outcome: Progressing   Problem: Safety: Goal: Ability to remain free from injury will improve Outcome: Progressing   Problem: Skin Integrity: Goal: Risk for impaired skin integrity will decrease Outcome: Progressing   Problem: Self-Concept: Goal: Ability to identify factors that promote anxiety will improve Outcome: Not Progressing   Problem: Coping: Goal: Coping ability will improve Outcome: Not Progressing

## 2022-12-22 NOTE — [facility] Group Notes (Signed)
LCSW Group Therapy Note   12/22/2022 1:15pm   Type of Therapy and Topic:  Group Therapy:  Overcoming Obstacles   Participation Level:  None   Description of Group:    In this group patients will be encouraged to explore what they see as obstacles to their own wellness and recovery. They will be guided to discuss their thoughts, feelings, and behaviors related to these obstacles. The group will process together ways to cope with barriers, with attention given to specific choices patients can make. Each patient will be challenged to identify changes they are motivated to make in order to overcome their obstacles. This group will be process-oriented, with patients participating in exploration of their own experiences as well as giving and receiving support and challenge from other group members.   Therapeutic Goals: Patient will identify personal and current obstacles as they relate to admission. Patient will identify barriers that currently interfere with their wellness or overcoming obstacles.  Patient will identify feelings, thought process and behaviors related to these barriers. Patient will identify two changes they are willing to make to overcome these obstacles:      Summary of Patient Progress:pt came to group, stayed the entire time, and for the first time with this CSW was not disruptive at all.  Once or twice made a noise during discussion but otherwise remained quiet throughout.      Therapeutic Modalities:   Cognitive Behavioral Therapy Solution Focused Therapy Motivational Interviewing Relapse Prevention Therapy  Joanne Chars, LCSW 12/22/2022 2:49 PM

## 2022-12-22 NOTE — Plan of Care (Signed)
  Problem: Self-Concept: Goal: Ability to modify response to factors that promote anxiety will improve Outcome: Progressing   Problem: Health Behavior/Discharge Planning: Goal: Ability to manage health-related needs will improve Outcome: Progressing   Problem: Skin Integrity: Goal: Risk for impaired skin integrity will decrease Outcome: Progressing   Problem: Activity: Goal: Interest or engagement in leisure activities will improve Outcome: Not Progressing

## 2022-12-22 NOTE — Progress Notes (Signed)
Patient is alert and oriented x 4, affect is flat but brightens upon approach, he appears less irritable and interacting appropriately with peers and staff. Patient denies SI/HI/AVH, he was offered emotional support. Patient was complaint with medication regimen no distress noted will continue to monitor.

## 2022-12-22 NOTE — Progress Notes (Signed)
[facility] MD Progress Note  12/22/2022 1:05 PM Adam [name]  MRN:  [id number] Subjective: Follow-up patient with schizophrenia.  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[place])   Stroke (HCC)   HTN (hypertension)   HLD (hyperlipidemia)   CAD (coronary artery disease)   Chronic systolic CHF (congestive heart failure) (HCC)   Anxiety   Infection of left earlobe  Total Time spent with patient: 30 minutes  Past Psychiatric History: Past history of schizophrenia  Past Medical History:  Past Medical History:  Diagnosis Date   Myocardial infarction ([place])    Schizophrenia ([place])    Stroke ([place])     Past Surgical History:  Procedure Laterality Date   NO PAST SURGERIES     Family History:  Family History  Problem Relation Age of Onset   Hypertension Mother    Family Psychiatric  History: See previous Social History:  Social History   Substance and Sexual Activity  Alcohol Use Not Currently     Social History   Substance and Sexual Activity  Drug Use Not Currently    Social History   Socioeconomic History   Marital status: Single    Spouse name: Not on file   Number of children: Not on file   Years of education: Not on file   Highest education level: Not on file  Occupational History   Not on file  Tobacco Use   Smoking status: Never    Passive exposure: Never   Smokeless tobacco: Never  Vaping Use   Vaping Use: Unknown  Substance and Sexual Activity   Alcohol use: Not Currently   Drug use: Not Currently   Sexual activity: Not Currently  Other Topics Concern   Not on file  Social History Narrative   Not on file   Social Determinants of Health   Financial Resource Strain: Not on file  Food  Insecurity: Not on file  Transportation Needs: Not on file  Physical Activity: Not on file  Stress: Not on file  Social Connections: Not on file   Additional Social History:  Specify valuables returned:  (none)                      Sleep: Fair  Appetite:  Fair  Current Medications: Current Facility-Administered Medications  Medication Dose Route Frequency Provider Last Rate Last Admin   acetaminophen (TYLENOL) tablet 650 mg  650 mg Oral Q6H PRN Daisia Slomski T, MD   650 mg at 12/22/22 0913   alum & mag hydroxide-simeth (MAALOX/MYLANTA) 200-200-20 MG/5ML suspension 30 mL  30 mL Oral Q4H PRN Joselynne Killam T, MD   30 mL at 04/26/22 5462   aspirin EC tablet 81 mg  81 mg Oral Daily Adair Lemar, [name], MD   81 mg at 12/22/22 0911   atorvastatin (LIPITOR) tablet 20 mg  20 mg Oral Daily Tyren Dugar T, MD   20 mg at 12/22/22 0912   atropine 1 % ophthalmic solution 1 drop  1 drop Sublingual QID [name], MD   1 drop at 12/22/22 0912   benztropine (COGENTIN) tablet 0.5 mg  0.5 mg Oral BID Fernandez Kenley T, MD   0.5 mg at 12/22/22 0911   clonazePAM (KLONOPIN) tablet 1 mg  1 mg Oral TID PRN He, [name], MD  1 mg at 12/14/22 1709   cloZAPine (CLOZARIL) tablet 300 mg  300 mg Oral QHS Alyas Creary T, MD   300 mg at 12/21/22 2200   diphenhydrAMINE (BENADRYL) capsule 50 mg  50 mg Oral Q6H PRN Lithzy Bernard T, MD   50 mg at 12/14/22 1709   Or   diphenhydrAMINE (BENADRYL) injection 50 mg  50 mg Intramuscular Q6H PRN Anastasiya Gowin T, MD       feeding supplement (ENSURE ENLIVE / ENSURE PLUS) liquid 237 mL  237 mL Oral BID BM Niclas Markell T, MD   237 mL at 12/22/22 1233   gabapentin (NEURONTIN) capsule 300 mg  300 mg Oral TID Elizeo Rodriques T, MD   300 mg at 12/22/22 5462   haloperidol (HALDOL) tablet 2 mg  2 mg Oral TID Quinnlyn Hearns, [name], MD   2 mg at 12/22/22 7035   haloperidol (HALDOL) tablet 5 mg  5 mg Oral Q6H PRN Shilah Hefel T, MD   5 mg at 12/14/22 1709   Or   haloperidol lactate (HALDOL)  injection 5 mg  5 mg Intramuscular Q6H PRN Azion Centrella T, MD       lisinopril (ZESTRIL) tablet 5 mg  5 mg Oral Daily Makenzye Troutman T, MD   5 mg at 12/22/22 0912   magnesium hydroxide (MILK OF MAGNESIA) suspension 30 mL  30 mL Oral Daily PRN Jerri Glauser T, MD   30 mL at 12/03/22 1526   metoprolol succinate (TOPROL-XL) 24 hr tablet 25 mg  25 mg Oral Daily Danialle Dement T, MD   25 mg at 12/22/22 0911   ondansetron (ZOFRAN) tablet 4 mg  4 mg Oral Q8H PRN [name], NP   4 mg at 09/06/22 2320   paliperidone (INVEGA SUSTENNA) injection 156 mg  156 mg Intramuscular Q28 days Rebel Willcutt T, MD   156 mg at 12/18/22 1654   paliperidone (INVEGA) 24 hr tablet 6 mg  6 mg Oral QHS Tashaya Ancrum T, MD   6 mg at 12/21/22 2200   senna-docusate (Senokot-S) tablet 2 tablet  2 tablet Oral QHS PRN [name], DO   2 tablet at 09/15/22 2110   temazepam (RESTORIL) capsule 15 mg  15 mg Oral QHS Margaretann Abate T, MD   15 mg at 12/21/22 2201   ziprasidone (GEODON) injection 20 mg  20 mg Intramuscular Q12H PRN Gloriajean Okun, [name], MD   20 mg at 07/08/22 1453    Lab Results: No results found for this or any previous visit (from the past 48 hour(s)).  Blood Alcohol level:  Lab Results  Component Value Date   ETH <10 00/93/8182    Metabolic Disorder Labs: Lab Results  Component Value Date   HGBA1C 5.5 04/10/2022   MPG 111.15 04/10/2022   No results found for: "PROLACTIN" Lab Results  Component Value Date   CHOL 167 11/20/2021   TRIG 107 11/20/2021   HDL 26 (L) 11/20/2021   CHOLHDL 6.4 11/20/2021   VLDL 21 11/20/2021   LDLCALC 120 (H) 11/20/2021    Physical Findings: AIMS: Facial and Oral Movements Muscles of Facial Expression: None, normal Lips and Perioral Area: None, normal Jaw: None, normal Tongue: None, normal,Extremity Movements Upper (arms, wrists, hands, fingers): None, normal Lower (legs, knees, ankles, toes): None, normal, Trunk Movements Neck, shoulders, hips: None,  normal, Overall Severity Severity of abnormal movements (highest score from questions above): None, normal Incapacitation due to abnormal movements: None, normal Patient's awareness of abnormal movements (  rate only patient's report): No Awareness, Dental Status Current problems with teeth and/or dentures?: No Does patient usually wear dentures?: No  CIWA:    COWS:     Musculoskeletal: Strength & Muscle Tone: within normal limits Gait & Station: normal Patient leans: N/A  Psychiatric Specialty Exam:  Presentation  General Appearance:  Appropriate for Environment; Casual; Neat; Well Groomed  Eye Contact: Fair  Speech: Slow  Speech Volume: Decreased  Handedness: Left   Mood and Affect  Mood: Anxious  Affect: Congruent   Thought Process  Thought Processes: Goal Directed  Descriptions of Associations:Circumstantial  Orientation:Partial  Thought Content:Scattered  History of Schizophrenia/Schizoaffective disorder:Yes  Duration of Psychotic Symptoms:Greater than six months  Hallucinations:No data recorded Ideas of Reference:None  Suicidal Thoughts:No data recorded Homicidal Thoughts:No data recorded  Sensorium  Memory: Remote Good  Judgment: Fair  Insight: Fair   Art therapist  Concentration: Fair  Attention Span: Fair  Recall: Fair  Fund of Knowledge: Fair  Language: Fair   Psychomotor Activity  Psychomotor Activity:No data recorded  Assets  Assets:No data recorded  Sleep  Sleep:No data recorded   Physical Exam: Physical Exam Vitals reviewed.  Constitutional:      Appearance: Normal appearance.  HENT:     Head: Normocephalic and atraumatic.     Mouth/Throat:     Pharynx: Oropharynx is clear.  Eyes:     Pupils: Pupils are equal, round, and reactive to light.  Cardiovascular:     Rate and Rhythm: Normal rate and regular rhythm.  Pulmonary:     Effort: Pulmonary effort is normal.     Breath sounds: Normal  breath sounds.  Abdominal:     General: Abdomen is flat.     Palpations: Abdomen is soft.  Musculoskeletal:        General: Normal range of motion.  Skin:    General: Skin is warm and dry.  Neurological:     General: No focal deficit present.     Mental Status: He is alert. Mental status is at baseline.  Psychiatric:        Attention and Perception: He is inattentive.        Mood and Affect: Affect is blunt.        Speech: He is noncommunicative.    Review of Systems  Unable to perform ROS: Language   Blood pressure 103/79, pulse 89, temperature 98.7 F (37.1 C), resp. rate 15, height 5\' 8"  (1.727 m), weight 85.8 kg, SpO2 98 %. Body mass index is 28.76 kg/m.   Treatment Plan Summary: Medication management and Plan no change to medication management.  Patient appears to be completely stable.  Continue working on placement issues  , MD 12/22/2022, 1:05 PM

## 2022-12-22 NOTE — Progress Notes (Signed)
Patient presents with blunted affect and labile mood. He denies SI/HI/AVH/. Denies depression and anxiety. Endorses 4/10 R leg pain. Adm Tylenol 650 mg. Upon follow up, pain decreased to 2/10. Patient was visible in the milieu in and out of bedroom acting animated. Appetite good. Medication compliant.  Q15 minute unit checks in place.

## 2022-12-23 LAB — CBC WITH DIFFERENTIAL/PLATELET
Abs Immature Granulocytes: 0.02 10*3/uL (ref 0.00–0.07)
Basophils Absolute: 0.2 10*3/uL — ABNORMAL HIGH (ref 0.0–0.1)
Basophils Relative: 3 %
Eosinophils Absolute: 0 10*3/uL (ref 0.0–0.5)
Eosinophils Relative: 0 %
HCT: 40.1 % (ref 39.0–52.0)
Hemoglobin: 13.6 g/dL (ref 13.0–17.0)
Immature Granulocytes: 0 %
Lymphocytes Relative: 21 %
Lymphs Abs: 1.1 10*3/uL (ref 0.7–4.0)
MCH: 30.7 pg (ref 26.0–34.0)
MCHC: 33.9 g/dL (ref 30.0–36.0)
MCV: 90.5 fL (ref 80.0–100.0)
Monocytes Absolute: 0.5 10*3/uL (ref 0.1–1.0)
Monocytes Relative: 10 %
Neutro Abs: 3.6 10*3/uL (ref 1.7–7.7)
Neutrophils Relative %: 66 %
Platelets: 163 10*3/uL (ref 150–400)
RBC: 4.43 MIL/uL (ref 4.22–5.81)
RDW: 12.9 % (ref 11.5–15.5)
WBC: 5.4 10*3/uL (ref 4.0–10.5)
nRBC: 0 % (ref 0.0–0.2)

## 2022-12-23 NOTE — Plan of Care (Signed)
  Problem: Activity: Goal: Risk for activity intolerance will decrease Outcome: Progressing   Problem: Nutrition: Goal: Adequate nutrition will be maintained Outcome: Progressing   Problem: Coping: Goal: Level of anxiety will decrease Outcome: Progressing   Problem: Activity: Goal: Interest or engagement in leisure activities will improve Outcome: Progressing

## 2022-12-23 NOTE — Progress Notes (Signed)
Patient is alert and oriented x 4, affect is blunted appears irritable not interacting appropriately with peers and staff. Patient denies SI/HI/AVH, he was offered emotional support. Patient was complaint with medication regimen no distress noted will continue to monitor.

## 2022-12-23 NOTE — [facility] Group Notes (Signed)
[facility] Group Notes:  (Nursing/MHT/Case Management/Adjunct)  Date:  12/23/2022  Time:  11:13 AM  Type of Therapy:   community meeting  Participation Level:  None  Participation Quality:   none  Affect:  Appropriate  Cognitive:  Appropriate  Insight:  None  Engagement in Group:  None  Modes of Intervention:  Discussion and Education  Summary of Progress/Problems:  Antonieta Pert 12/23/2022, 11:13 AM

## 2022-12-23 NOTE — Progress Notes (Signed)
Pt denies SI/HI/AVH and verbally agrees to approach staff if these become apparent or before harming themselves/others. Rates depression 0/10. Rates anxiety 0/10. Rates pain on face scale 6. Pt has been loud throughout the day. Pt was wanting to be called [place] the entire morning. Pt was excited for the hockey game this evening. Scheduled medications administered to pt, per MD orders. RN provided support and encouragement to pt. Q15 min safety checks implemented and continued. Pt safe on the unit. RN will continue to monitor and intervene as needed.  12/23/22 0752  Psych Admission Type (Psych Patients Only)  Admission Status Involuntary  Psychosocial Assessment  Patient Complaints None  Eye Contact Fair  Facial Expression Animated  Affect Silly;Labile  Speech Aphasic  Interaction Childlike  Motor Activity Restless  Appearance/Hygiene Unremarkable  Behavior Characteristics Cooperative;Appropriate to situation  Mood Preoccupied;Labile  Aggressive Behavior  Effect No apparent injury  Thought Process  Coherency WDL  Content Preoccupation  Delusions None reported or observed  Perception WDL  Hallucination None reported or observed  Judgment Impaired  Confusion None  Danger to Self  Current suicidal ideation? Denies  Danger to Others  Danger to Others None reported or observed  Danger to Others Abnormal  Harmful Behavior to others No threats or harm toward other people  Destructive Behavior No threats or harm toward property

## 2022-12-23 NOTE — Consult Note (Signed)
Pharmacy - Clozapine     ANC reported to the Clozapine REMS program. F/u with ANC in 7 days.    Oswald Hillock, PharmD, BCPS

## 2022-12-23 NOTE — Progress Notes (Signed)
[facility] MD Progress Note  12/23/2022 12:25 PM Adam [name]  MRN:  [id number] Subjective: Follow-up patient with schizophrenia and stroke.  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)   Stroke (HCC)   HTN (hypertension)   HLD (hyperlipidemia)   CAD (coronary artery disease)   Chronic systolic CHF (congestive heart failure) (HCC)   Anxiety   Infection of left earlobe  Total Time spent with patient: 20 minutes  Past Psychiatric History: Past history of schizophrenia  Past Medical History:  Past Medical History:  Diagnosis Date   Myocardial infarction (HCC)    Schizophrenia (HCC)    Stroke (HCC)     Past Surgical History:  Procedure Laterality Date   NO PAST SURGERIES     Family History:  Family History  Problem Relation Age of Onset   Hypertension Mother    Family Psychiatric  History: See previous Social History:  Social History   Substance and Sexual Activity  Alcohol Use Not Currently     Social History   Substance and Sexual Activity  Drug Use Not Currently    Social History   Socioeconomic History   Marital status: Single    Spouse name: Not on file   Number of children: Not on file   Years of education: Not on file   Highest education level: Not on file  Occupational History   Not on file  Tobacco Use   Smoking status: Never    Passive exposure: Never   Smokeless tobacco: Never  Vaping Use   Vaping Use: Unknown  Substance and Sexual Activity   Alcohol use: Not Currently   Drug use: Not Currently   Sexual activity: Not Currently  Other Topics Concern   Not on file  Social History Narrative   Not on file   Social Determinants of Health   Financial Resource Strain: Not on file  Food  Insecurity: Not on file  Transportation Needs: Not on file  Physical Activity: Not on file  Stress: Not on file  Social Connections: Not on file   Additional Social History:  Specify valuables returned:  (none)                      Sleep: Fair  Appetite:  Fair  Current Medications: Current Facility-Administered Medications  Medication Dose Route Frequency Provider Last Rate Last Admin   acetaminophen (TYLENOL) tablet 650 mg  650 mg Oral Q6H PRN Manly Nestle T, MD   650 mg at 12/23/22 0752   alum & mag hydroxide-simeth (MAALOX/MYLANTA) 200-200-20 MG/5ML suspension 30 mL  30 mL Oral Q4H PRN Storm Sovine T, MD   30 mL at 04/26/22 7425   aspirin EC tablet 81 mg  81 mg Oral Daily Davontay Watlington, [name], MD   81 mg at 12/23/22 9563   atorvastatin (LIPITOR) tablet 20 mg  20 mg Oral Daily Kden Wagster T, MD   20 mg at 12/23/22 0752   atropine 1 % ophthalmic solution 1 drop  1 drop Sublingual QID [name], MD   1 drop at 12/23/22 1137   benztropine (COGENTIN) tablet 0.5 mg  0.5 mg Oral BID Kendal Raffo T, MD   0.5 mg at 12/23/22 0751   clonazePAM (KLONOPIN) tablet 1 mg  1 mg Oral TID PRN He, [name], MD  1 mg at 12/22/22 2133   cloZAPine (CLOZARIL) tablet 300 mg  300 mg Oral QHS Chevez Sambrano, [name], MD   300 mg at 12/22/22 2133   diphenhydrAMINE (BENADRYL) capsule 50 mg  50 mg Oral Q6H PRN Kaylea Mounsey, [name], MD   50 mg at 12/14/22 1709   Or   diphenhydrAMINE (BENADRYL) injection 50 mg  50 mg Intramuscular Q6H PRN Finneas Mathe, [name], MD       feeding supplement (ENSURE ENLIVE / ENSURE PLUS) liquid 237 mL  237 mL Oral BID BM Juanmiguel Defelice T, MD   237 mL at 12/22/22 1233   gabapentin (NEURONTIN) capsule 300 mg  300 mg Oral TID Nason Conradt, [name], MD   300 mg at 12/23/22 4008   haloperidol (HALDOL) tablet 2 mg  2 mg Oral TID Wade Sigala, [name], MD   2 mg at 12/23/22 6761   haloperidol (HALDOL) tablet 5 mg  5 mg Oral Q6H PRN Jenie Parish, [name], MD   5 mg at 12/14/22 1709   Or   haloperidol lactate (HALDOL)  injection 5 mg  5 mg Intramuscular Q6H PRN Ortha Metts, [name], MD       lisinopril (ZESTRIL) tablet 5 mg  5 mg Oral Daily Taleigha Pinson, [name], MD   5 mg at 12/23/22 0755   magnesium hydroxide (MILK OF MAGNESIA) suspension 30 mL  30 mL Oral Daily PRN Kalana Yust, [name], MD   30 mL at 12/03/22 1526   metoprolol succinate (TOPROL-XL) 24 hr tablet 25 mg  25 mg Oral Daily Zuleyka Kloc T, MD   25 mg at 12/23/22 0755   ondansetron (ZOFRAN) tablet 4 mg  4 mg Oral Q8H PRN Jearld Lesch, NP   4 mg at 09/06/22 2320   paliperidone (INVEGA SUSTENNA) injection 156 mg  156 mg Intramuscular Q28 days Zora Glendenning, [name], MD   156 mg at 12/18/22 1654   paliperidone (INVEGA) 24 hr tablet 6 mg  6 mg Oral QHS Yatziry Deakins T, MD   6 mg at 12/22/22 2133   senna-docusate (Senokot-S) tablet 2 tablet  2 tablet Oral QHS PRN Sarina Ill, DO   2 tablet at 09/15/22 2110   temazepam (RESTORIL) capsule 15 mg  15 mg Oral QHS Renise Gillies T, MD   15 mg at 12/22/22 2133   ziprasidone (GEODON) injection 20 mg  20 mg Intramuscular Q12H PRN Jermeka Schlotterbeck, [name], MD   20 mg at 07/08/22 1453    Lab Results:  Results for orders placed or performed during the hospital encounter of 03/19/22 (from the past 48 hour(s))  CBC with Differential/Platelet     Status: Abnormal   Collection Time: 12/23/22 11:27 AM  Result Value Ref Range   WBC 5.4 4.0 - 10.5 K/uL   RBC 4.43 4.22 - 5.81 MIL/uL   Hemoglobin 13.6 13.0 - 17.0 g/dL   HCT 95.0 93.2 - 67.1 %   MCV 90.5 80.0 - 100.0 fL   MCH 30.7 26.0 - 34.0 pg   MCHC 33.9 30.0 - 36.0 g/dL   RDW 24.5 80.9 - 98.3 %   Platelets 163 150 - 400 K/uL   nRBC 0.0 0.0 - 0.2 %   Neutrophils Relative % 66 %   Neutro Abs 3.6 1.7 - 7.7 K/uL   Lymphocytes Relative 21 %   Lymphs Abs 1.1 0.7 - 4.0 K/uL   Monocytes Relative 10 %   Monocytes Absolute 0.5 0.1 - 1.0 K/uL   Eosinophils Relative 0 %  Eosinophils Absolute 0.0 0.0 - 0.5 K/uL   Basophils Relative 3 %   Basophils Absolute 0.2 (H) 0.0 - 0.1 K/uL    Immature Granulocytes 0 %   Abs Immature Granulocytes 0.02 0.00 - 0.07 K/uL    Comment: Performed at [facility], [address] [place], [place] [address]    Blood Alcohol level:  Lab Results  Component Value Date   [facility] <10 60/45/4098    Metabolic Disorder Labs: Lab Results  Component Value Date   HGBA1C 5.5 04/10/2022   MPG 111.15 04/10/2022   No results found for: "PROLACTIN" Lab Results  Component Value Date   CHOL 167 11/20/2021   TRIG 107 11/20/2021   HDL 26 (L) 11/20/2021   CHOLHDL 6.4 11/20/2021   VLDL 21 11/20/2021   LDLCALC 120 (H) 11/20/2021    Physical Findings: AIMS: Facial and Oral Movements Muscles of Facial Expression: None, normal Lips and Perioral Area: None, normal Jaw: None, normal Tongue: None, normal,Extremity Movements Upper (arms, wrists, hands, fingers): None, normal Lower (legs, knees, ankles, toes): None, normal, Trunk Movements Neck, shoulders, hips: None, normal, Overall Severity Severity of abnormal movements (highest score from questions above): None, normal Incapacitation due to abnormal movements: None, normal Patient's awareness of abnormal movements (rate only patient's report): No Awareness, Dental Status Current problems with teeth and/or dentures?: No Does patient usually wear dentures?: No  CIWA:    COWS:     Musculoskeletal: Strength & Muscle Tone: within normal limits Gait & Station: normal Patient leans: N/A  Psychiatric Specialty Exam:  Presentation  General Appearance:  Appropriate for Environment; Casual; Neat; Well Groomed  Eye Contact: Fair  Speech: Slow  Speech Volume: Decreased  Handedness: Left   Mood and Affect  Mood: Anxious  Affect: Congruent   Thought Process  Thought Processes: Goal Directed  Descriptions of Associations:Circumstantial  Orientation:Partial  Thought Content:Scattered  History of Schizophrenia/Schizoaffective disorder:Yes  Duration of Psychotic  Symptoms:Greater than six months  Hallucinations:No data recorded Ideas of Reference:None  Suicidal Thoughts:No data recorded Homicidal Thoughts:No data recorded  Sensorium  Memory: Remote Good  Judgment: Fair  Insight: Fair   Community education officer  Concentration: Fair  Attention Span: Fair  Recall: [employer] of Knowledge: Fair  Language: Fair   Psychomotor Activity  Psychomotor Activity:No data recorded  Assets  Assets:No data recorded  Sleep  Sleep:No data recorded   Physical Exam: Physical Exam Vitals and nursing note reviewed.  Constitutional:      Appearance: Normal appearance.  HENT:     Head: Normocephalic and atraumatic.     Mouth/Throat:     Pharynx: Oropharynx is clear.  Eyes:     Pupils: Pupils are equal, round, and reactive to light.  Cardiovascular:     Rate and Rhythm: Normal rate and regular rhythm.  Pulmonary:     Effort: Pulmonary effort is normal.     Breath sounds: Normal breath sounds.  Abdominal:     General: Abdomen is flat.     Palpations: Abdomen is soft.  Musculoskeletal:        General: Normal range of motion.  Skin:    General: Skin is warm and dry.  Neurological:     General: No focal deficit present.     Mental Status: He is alert. Mental status is at baseline.  Psychiatric:        Attention and Perception: He is inattentive.        Mood and Affect: Mood normal. Affect is blunt.  Speech: He is noncommunicative.    Review of Systems  Unable to perform ROS: Language   Blood pressure 104/81, pulse 85, temperature 98.7 F (37.1 C), resp. rate 15, height 5\' 8"  (1.727 m), weight 85.8 kg, SpO2 100 %. Body mass index is 28.76 kg/m.   Treatment Plan Summary: Plan no change to anything about presentation.  No change to behavior.  Plan entirely still focused on discharge  Alethia Berthold, MD 12/23/2022, 12:25 PM

## 2022-12-24 NOTE — Plan of Care (Signed)
  Problem: Education: Goal: Knowledge of General Education information will improve Description: Including pain rating scale, medication(s)/side effects and non-pharmacologic comfort measures Outcome: Progressing   Problem: Clinical Measurements: Goal: Will remain free from infection Outcome: Progressing   Problem: Activity: Goal: Risk for activity intolerance will decrease Outcome: Progressing   Problem: Nutrition: Goal: Adequate nutrition will be maintained Outcome: Progressing   Problem: Coping: Goal: Level of anxiety will decrease Outcome: Not Progressing   Problem: Coping: Goal: Coping ability will improve Outcome: Not Progressing

## 2022-12-24 NOTE — Plan of Care (Signed)
  Problem: Coping: Goal: Ability to identify and develop effective coping behavior will improve Outcome: Progressing   Problem: Self-Concept: Goal: Level of anxiety will decrease Outcome: Progressing   

## 2022-12-24 NOTE — Group Note (Signed)
[facility] LCSW Group Therapy Note    Group Date: 12/24/2022 Start Time: 1300 End Time: 1400  Type of Therapy and Topic:  Group Therapy:  Overcoming Obstacles  Participation Level:  [facility] PARTICIPATION LEVEL: Minimal  Mood:  Description of Group:   In this group patients will be encouraged to explore what they see as obstacles to their own wellness and recovery. They will be guided to discuss their thoughts, feelings, and behaviors related to these obstacles. The group will process together ways to cope with barriers, with attention given to specific choices patients can make. Each patient will be challenged to identify changes they are motivated to make in order to overcome their obstacles. This group will be process-oriented, with patients participating in exploration of their own experiences as well as giving and receiving support and challenge from other group members.  Therapeutic Goals: 1. Patient will identify personal and current obstacles as they relate to admission. 2. Patient will identify barriers that currently interfere with their wellness or overcoming obstacles.  3. Patient will identify feelings, thought process and behaviors related to these barriers. 4. Patient will identify two changes they are willing to make to overcome these obstacles:    Summary of Patient Progress Patient was present in group.  Patient unable to engage in discussion beyond saying his name when asked what is his obstacle.   Therapeutic Modalities:   Cognitive Behavioral Therapy Solution Focused Therapy Motivational Interviewing Relapse Prevention Therapy   Rozann Lesches, LCSW

## 2022-12-24 NOTE — [facility] Counselor (Signed)
CSW spoke with the patient's mother.  CSW sought clarification on if patient was his own guardian.  Mother reports that patient IS his own guardian.  She reports that she was advised that she did not need to pursue guardianship as she was the Healthcare POA.  CSW asked if mother had healthcare POA and mother initially reported that she was.  Mother reports that she can not find the forms and would have some difficulty in getting them to this CSW when found.  However, mother later amended her statement and reported that the only POA she has is the one currently on file.  Current POA on file only provides mother with the financial decision making ability per legal.  Further review is being made by legal to determine if mother has healthcare POA or not.  Mother reports that the POA has been in effect since 1976.  She reports plans to begin paperwork to become patient's healthcare POA.  Assunta Curtis, MSW, LCSW 12/24/2022 3:00 PM

## 2022-12-24 NOTE — Progress Notes (Signed)
Patient alert and oriented x 2 with periods of confusion to situation. Patient is noted agitated, intrusive, pacing, yelling and disruptive in the milieu. Patient was frequently redirected but he got louder and he was medicated for agitation PRN. Patient is not receptive staff, he was given night time medication as scheduled and offered emotional support. 15 minutes safety checks maintained will continue to monitor.

## 2022-12-24 NOTE — Progress Notes (Signed)
Unremarkable shift. Noted in dayroom and walking halls. Appropriate interaction with staff and peers. No complaints or concerns voiced. Medication compliant. Encouragement and support provided. Safety checks maintained. Medications given as prescribed. Pt receptive and remains safe on unit with q 15 min checks.

## 2022-12-24 NOTE — Progress Notes (Signed)
Recreation Therapy Notes    Date: 12/24/2022  Time: 10:30 am  Location: Craft room     Behavioral response: N/A   Intervention Topic: Time Management   Discussion/Intervention: Patient refused to attend group.   Clinical Observations/Feedback:  Patient refused to attend group.    Elenna Spratling LRT/CTRS        Laini Urick 12/24/2022 1:38 PM 

## 2022-12-24 NOTE — Progress Notes (Signed)
[facility] MD Progress Note  12/24/2022 11:02 AM Adam [name]  MRN:  [id number] Subjective: Patient seen and chart reviewed.  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[place])   Stroke (HCC)   HTN (hypertension)   HLD (hyperlipidemia)   CAD (coronary artery disease)   Chronic systolic CHF (congestive heart failure) (HCC)   Anxiety   Infection of left earlobe  Total Time spent with patient: 15 minutes  Past Psychiatric History: Past history of schizophrenia and stroke  Past Medical History:  Past Medical History:  Diagnosis Date   Myocardial infarction ([place])    Schizophrenia ([place])    Stroke ([place])     Past Surgical History:  Procedure Laterality Date   NO PAST SURGERIES     Family History:  Family History  Problem Relation Age of Onset   Hypertension Mother    Family Psychiatric  History: See previous Social History:  Social History   Substance and Sexual Activity  Alcohol Use Not Currently     Social History   Substance and Sexual Activity  Drug Use Not Currently    Social History   Socioeconomic History   Marital status: Single    Spouse name: Not on file   Number of children: Not on file   Years of education: Not on file   Highest education level: Not on file  Occupational History   Not on file  Tobacco Use   Smoking status: Never    Passive exposure: Never   Smokeless tobacco: Never  Vaping Use   Vaping Use: Unknown  Substance and Sexual Activity   Alcohol use: Not Currently   Drug use: Not Currently   Sexual activity: Not Currently  Other Topics Concern   Not on file  Social History Narrative   Not on file   Social Determinants of Health   Financial Resource Strain: Not on file  Food  Insecurity: Not on file  Transportation Needs: Not on file  Physical Activity: Not on file  Stress: Not on file  Social Connections: Not on file   Additional Social History:  Specify valuables returned:  (none)                      Sleep: Fair  Appetite:  Fair  Current Medications: Current Facility-Administered Medications  Medication Dose Route Frequency Provider Last Rate Last Admin   acetaminophen (TYLENOL) tablet 650 mg  650 mg Oral Q6H PRN Alyshia Kernan T, MD   650 mg at 12/24/22 0821   alum & mag hydroxide-simeth (MAALOX/MYLANTA) 200-200-20 MG/5ML suspension 30 mL  30 mL Oral Q4H PRN Matheus Spiker T, MD   30 mL at 04/26/22 5366   aspirin EC tablet 81 mg  81 mg Oral Daily Socorro Ebron, [name], MD   81 mg at 12/24/22 4403   atorvastatin (LIPITOR) tablet 20 mg  20 mg Oral Daily Randolf Sansoucie T, MD   20 mg at 12/24/22 4742   atropine 1 % ophthalmic solution 1 drop  1 drop Sublingual QID [name], MD   1 drop at 12/24/22 0828   benztropine (COGENTIN) tablet 0.5 mg  0.5 mg Oral BID Ady Heimann T, MD   0.5 mg at 12/24/22 5956   clonazePAM (KLONOPIN) tablet 1 mg  1 mg Oral TID PRN He, [name],  MD   1 mg at 12/22/22 2133   cloZAPine (CLOZARIL) tablet 300 mg  300 mg Oral QHS Mckynzie Liwanag, [name], MD   300 mg at 12/23/22 2104   diphenhydrAMINE (BENADRYL) capsule 50 mg  50 mg Oral Q6H PRN Cynthia Stainback, [name], MD   50 mg at 12/14/22 1709   Or   diphenhydrAMINE (BENADRYL) injection 50 mg  50 mg Intramuscular Q6H PRN Parrie Rasco, [name], MD       feeding supplement (ENSURE ENLIVE / ENSURE PLUS) liquid 237 mL  237 mL Oral BID BM Katyana Trolinger T, MD   237 mL at 12/24/22 1010   gabapentin (NEURONTIN) capsule 300 mg  300 mg Oral TID Taquila Leys, [name], MD   300 mg at 12/24/22 1004   haloperidol (HALDOL) tablet 2 mg  2 mg Oral TID Brasen Bundren, [name], MD   2 mg at 12/24/22 1004   haloperidol (HALDOL) tablet 5 mg  5 mg Oral Q6H PRN Yifan Auker, [name], MD   5 mg at 12/14/22 1709   Or   haloperidol lactate (HALDOL)  injection 5 mg  5 mg Intramuscular Q6H PRN Jaicee Michelotti, [name], MD       lisinopril (ZESTRIL) tablet 5 mg  5 mg Oral Daily Jazzlynn Rawe, [name], MD   5 mg at 12/24/22 0825   magnesium hydroxide (MILK OF MAGNESIA) suspension 30 mL  30 mL Oral Daily PRN Tessi Eustache, [name], MD   30 mL at 12/03/22 1526   metoprolol succinate (TOPROL-XL) 24 hr tablet 25 mg  25 mg Oral Daily Lenox Ladouceur T, MD   25 mg at 12/24/22 0825   ondansetron (ZOFRAN) tablet 4 mg  4 mg Oral Q8H PRN Deloria Lair, NP   4 mg at 09/06/22 2320   paliperidone (INVEGA SUSTENNA) injection 156 mg  156 mg Intramuscular Q28 days Mariha Sleeper, [name], MD   156 mg at 12/18/22 1654   paliperidone (INVEGA) 24 hr tablet 6 mg  6 mg Oral QHS Sheffield Hawker T, MD   6 mg at 12/23/22 2104   senna-docusate (Senokot-S) tablet 2 tablet  2 tablet Oral QHS PRN [name], DO   2 tablet at 09/15/22 2110   temazepam (RESTORIL) capsule 15 mg  15 mg Oral QHS Orlanda Lemmerman T, MD   15 mg at 12/23/22 2104   ziprasidone (GEODON) injection 20 mg  20 mg Intramuscular Q12H PRN Hussain Maimone, [name], MD   20 mg at 07/08/22 1453    Lab Results:  Results for orders placed or performed during the hospital encounter of 03/19/22 (from the past 48 hour(s))  CBC with Differential/Platelet     Status: Abnormal   Collection Time: 12/23/22 11:27 AM  Result Value Ref Range   WBC 5.4 4.0 - 10.5 K/uL   RBC 4.43 4.22 - 5.81 MIL/uL   Hemoglobin 13.6 13.0 - 17.0 g/dL   HCT 40.1 39.0 - 52.0 %   MCV 90.5 80.0 - 100.0 fL   MCH 30.7 26.0 - 34.0 pg   MCHC 33.9 30.0 - 36.0 g/dL   RDW 12.9 11.5 - 15.5 %   Platelets 163 150 - 400 K/uL   nRBC 0.0 0.0 - 0.2 %   Neutrophils Relative % 66 %   Neutro Abs 3.6 1.7 - 7.7 K/uL   Lymphocytes Relative 21 %   Lymphs Abs 1.1 0.7 - 4.0 K/uL   Monocytes Relative 10 %   Monocytes Absolute 0.5 0.1 - 1.0 K/uL   Eosinophils Relative  0 %   Eosinophils Absolute 0.0 0.0 - 0.5 K/uL   Basophils Relative 3 %   Basophils Absolute 0.2 (H) 0.0 - 0.1 K/uL    Immature Granulocytes 0 %   Abs Immature Granulocytes 0.02 0.00 - 0.07 K/uL    Comment: Performed at [facility], [address] [place], [place] [address]    Blood Alcohol level:  Lab Results  Component Value Date   [facility] <10 07/20/2021    Metabolic Disorder Labs: Lab Results  Component Value Date   HGBA1C 5.5 04/10/2022   MPG 111.15 04/10/2022   No results found for: "PROLACTIN" Lab Results  Component Value Date   CHOL 167 11/20/2021   TRIG 107 11/20/2021   HDL 26 (L) 11/20/2021   CHOLHDL 6.4 11/20/2021   VLDL 21 11/20/2021   LDLCALC 120 (H) 11/20/2021    Physical Findings: AIMS: Facial and Oral Movements Muscles of Facial Expression: None, normal Lips and Perioral Area: None, normal Jaw: None, normal Tongue: None, normal,Extremity Movements Upper (arms, wrists, hands, fingers): None, normal Lower (legs, knees, ankles, toes): None, normal, Trunk Movements Neck, shoulders, hips: None, normal, Overall Severity Severity of abnormal movements (highest score from questions above): None, normal Incapacitation due to abnormal movements: None, normal Patient's awareness of abnormal movements (rate only patient's report): No Awareness, Dental Status Current problems with teeth and/or dentures?: No Does patient usually wear dentures?: No  CIWA:    COWS:     Musculoskeletal: Strength & Muscle Tone: within normal limits Gait & Station: normal Patient leans: N/A  Psychiatric Specialty Exam:  Presentation  General Appearance:  Appropriate for Environment; Casual; Neat; Well Groomed  Eye Contact: Fair  Speech: Slow  Speech Volume: Decreased  Handedness: Left   Mood and Affect  Mood: Anxious  Affect: Congruent   Thought Process  Thought Processes: Goal Directed  Descriptions of Associations:Circumstantial  Orientation:Partial  Thought Content:Scattered  History of Schizophrenia/Schizoaffective disorder:Yes  Duration of Psychotic  Symptoms:Greater than six months  Hallucinations:No data recorded Ideas of Reference:None  Suicidal Thoughts:No data recorded Homicidal Thoughts:No data recorded  Sensorium  Memory: Remote Good  Judgment: Fair  Insight: Fair   Art therapist  Concentration: Fair  Attention Span: Fair  Recall: [employer] of Knowledge: Fair  Language: Fair   Psychomotor Activity  Psychomotor Activity:No data recorded  Assets  Assets:No data recorded  Sleep  Sleep:No data recorded   Physical Exam: Physical Exam Vitals and nursing note reviewed.  Constitutional:      Appearance: Normal appearance.  HENT:     Head: Normocephalic and atraumatic.     Mouth/Throat:     Pharynx: Oropharynx is clear.  Eyes:     Pupils: Pupils are equal, round, and reactive to light.  Cardiovascular:     Rate and Rhythm: Normal rate and regular rhythm.  Pulmonary:     Effort: Pulmonary effort is normal.     Breath sounds: Normal breath sounds.  Abdominal:     General: Abdomen is flat.     Palpations: Abdomen is soft.  Musculoskeletal:        General: Normal range of motion.  Skin:    General: Skin is warm and dry.  Neurological:     General: No focal deficit present.     Mental Status: He is alert. Mental status is at baseline.  Psychiatric:        Attention and Perception: Attention normal.        Mood and Affect: Affect is blunt.  Speech: He is noncommunicative.    Review of Systems  Unable to perform ROS: Language   Blood pressure 116/89, pulse (!) 102, temperature 98.1 F (36.7 C), temperature source Oral, resp. rate 15, height 5\' 8"  (1.727 m), weight 85.8 kg, SpO2 100 %. Body mass index is 28.76 kg/m.   Treatment Plan Summary: Plan no change to treatment plan.  Working on placement.  , MD 12/24/2022, 11:02 AM

## 2022-12-24 NOTE — Progress Notes (Signed)
Pt denies SI/HI/AVH and verbally agrees to approach staff if these become apparent or before harming themselves/others. Rates pain 4/10. Pt was irritated this morning and wanted staff to put [facility] on the room door. Pt told that we cannot and the pt did not like it. Pt has been fine ever since this morning. Pt would occasionally yell. Scheduled medications administered to pt, per MD orders. RN provided support and encouragement to pt. Q15 min safety checks implemented and continued. Pt safe on the unit. RN will continue to monitor and intervene as needed.  12/24/22 0821  Psych Admission Type (Psych Patients Only)  Admission Status Involuntary  Psychosocial Assessment  Patient Complaints Anger  Eye Contact Fair  Facial Expression Angry;Animated  Affect Angry;Labile  Speech Aphasic;Argumentative  Interaction Assertive  Motor Activity Restless  Appearance/Hygiene Unremarkable  Behavior Characteristics Agitated  Mood Labile;Preoccupied  Aggressive Behavior  Effect No apparent injury  Thought Process  Coherency WDL  Content Preoccupation  Delusions None reported or observed  Perception Depersonalization  Hallucination None reported or observed  Judgment Limited  Confusion None  Danger to Self  Current suicidal ideation? Denies  Danger to Others  Danger to Others None reported or observed  Danger to Others Abnormal  Harmful Behavior to others No threats or harm toward other people  Destructive Behavior No threats or harm toward property

## 2022-12-25 NOTE — Group Note (Signed)
[facility] LCSW Group Therapy Note   Group Date: 12/25/2022 Start Time: 1300 End Time: 1400  Type of Therapy/Topic:  Group Therapy:  Feelings about Diagnosis  Participation Level:  Minimal    Description of Group:    This group will allow patients to explore their thoughts and feelings about diagnoses they have received. Patients will be guided to explore their level of understanding and acceptance of these diagnoses. Facilitator will encourage patients to process their thoughts and feelings about the reactions of others to their diagnosis, and will guide patients in identifying ways to discuss their diagnosis with significant others in their lives. This group will be process-oriented, with patients participating in exploration of their own experiences as well as giving and receiving support and challenge from other group members.   Therapeutic Goals: 1. Patient will demonstrate understanding of diagnosis as evidence by identifying two or more symptoms of the disorder:  2. Patient will be able to express two feelings regarding the diagnosis 3. Patient will demonstrate ability to communicate their needs through discussion and/or role plays  Summary of Patient Progress: Patient was present for the majority of the group process. His contribution to today's discussion was "Leeroy Bock" when asked what season he liked during the icebreaker.   Therapeutic Modalities:   Cognitive Behavioral Therapy Brief Therapy Feelings Identification    Shirl Harris, LCSW

## 2022-12-25 NOTE — Plan of Care (Signed)
D: Patient alert and oriented. Patient endorsed left leg pain when waking up. Patient requested PRN tylenol. Patient no longer endorses pain in left leg after receiving PRN tylenol. Patient denies anxiety and depression. Patient denies SI/HI/AVH.  Patient frequently observed walking around the unit interacting with staff.   A: Scheduled medications administered to patient, per MD orders.  Support and encouragement provided to patient.  Q15 minute safety checks maintained.   R: Patient compliant with medication administration and treatment plan. No adverse drug reactions noted. Patient remains safe on the unit at this time. Problem: Education: Goal: Knowledge of General Education information will improve Description: Including pain rating scale, medication(s)/side effects and non-pharmacologic comfort measures Outcome: Progressing   Problem: Clinical Measurements: Goal: Ability to maintain clinical measurements within normal limits will improve Outcome: Progressing Goal: Will remain free from infection Outcome: Progressing   Problem: Nutrition: Goal: Adequate nutrition will be maintained Outcome: Progressing   Problem: Pain Managment: Goal: General experience of comfort will improve Outcome: Progressing   Problem: Education: Goal: Will be free of psychotic symptoms Outcome: Progressing

## 2022-12-25 NOTE — Progress Notes (Signed)
[facility] MD Progress Note  12/25/2022 5:33 PM Adam [name]  MRN:  [id number] Subjective: Follow-up patient with schizophrenia.  No change to behavior.  No change to clinical situation. 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[place])   Stroke (HCC)   HTN (hypertension)   HLD (hyperlipidemia)   CAD (coronary artery disease)   Chronic systolic CHF (congestive heart failure) (HCC)   Anxiety   Infection of left earlobe  Total Time spent with patient: 20 minutes  Past Psychiatric History: Past history of schizophrenia  Past Medical History:  Past Medical History:  Diagnosis Date   Myocardial infarction ([place])    Schizophrenia ([place])    Stroke ([place])     Past Surgical History:  Procedure Laterality Date   NO PAST SURGERIES     Family History:  Family History  Problem Relation Age of Onset   Hypertension Mother    Family Psychiatric  History: See previous Social History:  Social History   Substance and Sexual Activity  Alcohol Use Not Currently     Social History   Substance and Sexual Activity  Drug Use Not Currently    Social History   Socioeconomic History   Marital status: Single    Spouse name: Not on file   Number of children: Not on file   Years of education: Not on file   Highest education level: Not on file  Occupational History   Not on file  Tobacco Use   Smoking status: Never    Passive exposure: Never   Smokeless tobacco: Never  Vaping Use   Vaping Use: Unknown  Substance and Sexual Activity   Alcohol use: Not Currently   Drug use: Not Currently   Sexual activity: Not Currently  Other Topics Concern   Not on file  Social History Narrative   Not on file   Social Determinants of Health   Financial Resource Strain: Not on file  Food  Insecurity: Not on file  Transportation Needs: Not on file  Physical Activity: Not on file  Stress: Not on file  Social Connections: Not on file   Additional Social History:  Specify valuables returned:  (none)                      Sleep: Fair  Appetite:  Fair  Current Medications: Current Facility-Administered Medications  Medication Dose Route Frequency Provider Last Rate Last Admin   acetaminophen (TYLENOL) tablet 650 mg  650 mg Oral Q6H PRN Raynetta Osterloh T, MD   650 mg at 12/25/22 0756   alum & mag hydroxide-simeth (MAALOX/MYLANTA) 200-200-20 MG/5ML suspension 30 mL  30 mL Oral Q4H PRN Anush Wiedeman T, MD   30 mL at 04/26/22 0093   aspirin EC tablet 81 mg  81 mg Oral Daily Topacio Cella, [name], MD   81 mg at 12/25/22 0755   atorvastatin (LIPITOR) tablet 20 mg  20 mg Oral Daily Oliveah Zwack T, MD   20 mg at 12/25/22 0755   atropine 1 % ophthalmic solution 1 drop  1 drop Sublingual QID [name], MD   1 drop at 12/25/22 1653   benztropine (COGENTIN) tablet 0.5 mg  0.5 mg Oral BID Eymi Lipuma T, MD   0.5 mg at 12/25/22 1653   clonazePAM (KLONOPIN) tablet 1 mg  1 mg Oral TID PRN He, [name], MD  1 mg at 12/22/22 2133   cloZAPine (CLOZARIL) tablet 300 mg  300 mg Oral QHS Asiah Browder T, MD   300 mg at 12/24/22 2056   diphenhydrAMINE (BENADRYL) capsule 50 mg  50 mg Oral Q6H PRN Aylani Spurlock T, MD   50 mg at 12/14/22 1709   Or   diphenhydrAMINE (BENADRYL) injection 50 mg  50 mg Intramuscular Q6H PRN Siyah Mault T, MD       feeding supplement (ENSURE ENLIVE / ENSURE PLUS) liquid 237 mL  237 mL Oral BID BM Santi Troung T, MD   237 mL at 12/25/22 1433   gabapentin (NEURONTIN) capsule 300 mg  300 mg Oral TID Kaeden Mester T, MD   300 mg at 12/25/22 1432   haloperidol (HALDOL) tablet 2 mg  2 mg Oral TID Breeze Berringer T, MD   2 mg at 12/25/22 1431   haloperidol (HALDOL) tablet 5 mg  5 mg Oral Q6H PRN Lovinia Snare, [name], MD   5 mg at 12/14/22 1709   Or   haloperidol lactate (HALDOL)  injection 5 mg  5 mg Intramuscular Q6H PRN Tison Leibold T, MD       lisinopril (ZESTRIL) tablet 5 mg  5 mg Oral Daily Acsa Estey T, MD   5 mg at 12/25/22 0755   magnesium hydroxide (MILK OF MAGNESIA) suspension 30 mL  30 mL Oral Daily PRN Tiajah Oyster T, MD   30 mL at 12/03/22 1526   metoprolol succinate (TOPROL-XL) 24 hr tablet 25 mg  25 mg Oral Daily Reylynn Vanalstine T, MD   25 mg at 12/25/22 0755   ondansetron (ZOFRAN) tablet 4 mg  4 mg Oral Q8H PRN [name], NP   4 mg at 09/06/22 2320   paliperidone (INVEGA SUSTENNA) injection 156 mg  156 mg Intramuscular Q28 days Jerone Cudmore T, MD   156 mg at 12/18/22 1654   paliperidone (INVEGA) 24 hr tablet 6 mg  6 mg Oral QHS Deanndra Kirley T, MD   6 mg at 12/24/22 2056   senna-docusate (Senokot-S) tablet 2 tablet  2 tablet Oral QHS PRN [name], DO   2 tablet at 09/15/22 2110   temazepam (RESTORIL) capsule 15 mg  15 mg Oral QHS Zubin Pontillo T, MD   15 mg at 12/24/22 2057   ziprasidone (GEODON) injection 20 mg  20 mg Intramuscular Q12H PRN Yashua Bracco, [name], MD   20 mg at 07/08/22 1453    Lab Results: No results found for this or any previous visit (from the past 48 hour(s)).  Blood Alcohol level:  Lab Results  Component Value Date   ETH <10 53/29/9242    Metabolic Disorder Labs: Lab Results  Component Value Date   HGBA1C 5.5 04/10/2022   MPG 111.15 04/10/2022   No results found for: "PROLACTIN" Lab Results  Component Value Date   CHOL 167 11/20/2021   TRIG 107 11/20/2021   HDL 26 (L) 11/20/2021   CHOLHDL 6.4 11/20/2021   VLDL 21 11/20/2021   LDLCALC 120 (H) 11/20/2021    Physical Findings: AIMS: Facial and Oral Movements Muscles of Facial Expression: None, normal Lips and Perioral Area: None, normal Jaw: None, normal Tongue: None, normal,Extremity Movements Upper (arms, wrists, hands, fingers): None, normal Lower (legs, knees, ankles, toes): None, normal, Trunk Movements Neck, shoulders, hips: None,  normal, Overall Severity Severity of abnormal movements (highest score from questions above): None, normal Incapacitation due to abnormal movements: None, normal Patient's awareness of abnormal movements (  rate only patient's report): No Awareness, Dental Status Current problems with teeth and/or dentures?: No Does patient usually wear dentures?: No  CIWA:    COWS:     Musculoskeletal: Strength & Muscle Tone: within normal limits Gait & Station: normal Patient leans: N/A  Psychiatric Specialty Exam:  Presentation  General Appearance:  Appropriate for Environment; Casual; Neat; Well Groomed  Eye Contact: Fair  Speech: Slow  Speech Volume: Decreased  Handedness: Left   Mood and Affect  Mood: Anxious  Affect: Congruent   Thought Process  Thought Processes: Goal Directed  Descriptions of Associations:Circumstantial  Orientation:Partial  Thought Content:Scattered  History of Schizophrenia/Schizoaffective disorder:Yes  Duration of Psychotic Symptoms:Greater than six months  Hallucinations:No data recorded Ideas of Reference:None  Suicidal Thoughts:No data recorded Homicidal Thoughts:No data recorded  Sensorium  Memory: Remote Good  Judgment: Fair  Insight: Fair   Art therapist  Concentration: Fair  Attention Span: Fair  Recall: [employer] of Knowledge: Fair  Language: Fair   Psychomotor Activity  Psychomotor Activity:No data recorded  Assets  Assets:No data recorded  Sleep  Sleep:No data recorded   Physical Exam: Physical Exam Vitals and nursing note reviewed.  Constitutional:      Appearance: Normal appearance.  HENT:     Head: Normocephalic and atraumatic.     Mouth/Throat:     Pharynx: Oropharynx is clear.  Eyes:     Pupils: Pupils are equal, round, and reactive to light.  Cardiovascular:     Rate and Rhythm: Normal rate and regular rhythm.  Pulmonary:     Effort: Pulmonary effort is normal.     Breath  sounds: Normal breath sounds.  Abdominal:     General: Abdomen is flat.     Palpations: Abdomen is soft.  Musculoskeletal:        General: Normal range of motion.  Skin:    General: Skin is warm and dry.  Neurological:     General: No focal deficit present.     Mental Status: He is alert. Mental status is at baseline.  Psychiatric:        Attention and Perception: He is inattentive.        Mood and Affect: Affect is blunt.        Speech: He is noncommunicative.    Review of Systems  Unable to perform ROS: Language   Blood pressure (!) 129/90, pulse 86, temperature 98.8 F (37.1 C), temperature source Oral, resp. rate 15, height 5\' 8"  (1.727 m), weight 85.8 kg, SpO2 100 %. Body mass index is 28.76 kg/m.   Treatment Plan Summary: Medication management and Plan patient continues to be very stable.  There is no indication to make any change to the treatment plan.  Supportive encouragement as always.  , MD 12/25/2022, 5:33 PM

## 2022-12-25 NOTE — [facility] Counselor (Signed)
CSW reached out to Lynn [phone]), recommended by [facility].  CSW unable to speak with anyone or leave a voicemail as the voicemail box was full.  Assunta Curtis, MSW, LCSW 12/25/2022 1:02 PM

## 2022-12-25 NOTE — Progress Notes (Signed)
Recreation Therapy Notes    Date: 12/25/2022  Time: 9:50 am  Location: Courtyard     Behavioral response: N/A   Intervention Topic: Leisure   Discussion/Intervention: Patient refused to attend group.   Clinical Observations/Feedback:  Patient refused to attend group.    Maritza Hosterman LRT/CTRS        Willet Schleifer 12/25/2022 12:33 PM

## 2022-12-26 NOTE — Progress Notes (Signed)
[facility] MD Progress Note  12/26/2022 2:21 PM Adam [name]  MRN:  [id number] Subjective: Follow-up 62 year old man with schizophrenia.  No change to behavior.  No new complaints.  Continues to get along on the unit.  Manages to usually make his needs known poor naming and making sounds especially since staff are used to him. 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[place])   Stroke (HCC)   HTN (hypertension)   HLD (hyperlipidemia)   CAD (coronary artery disease)   Chronic systolic CHF (congestive heart failure) (HCC)   Anxiety   Infection of left earlobe  Total Time spent with patient: 20 minutes  Past Psychiatric History: Past history of schizophrenia and stroke  Past Medical History:  Past Medical History:  Diagnosis Date   Myocardial infarction ([place])    Schizophrenia ([place])    Stroke ([place])     Past Surgical History:  Procedure Laterality Date   NO PAST SURGERIES     Family History:  Family History  Problem Relation Age of Onset   Hypertension Mother    Family Psychiatric  History: See previous Social History:  Social History   Substance and Sexual Activity  Alcohol Use Not Currently     Social History   Substance and Sexual Activity  Drug Use Not Currently    Social History   Socioeconomic History   Marital status: Single    Spouse name: Not on file   Number of children: Not on file   Years of education: Not on file   Highest education level: Not on file  Occupational History   Not on file  Tobacco Use   Smoking status: Never    Passive exposure: Never   Smokeless tobacco: Never  Vaping Use   Vaping Use: Unknown  Substance and Sexual Activity   Alcohol use: Not Currently   Drug use: Not Currently   Sexual activity: Not Currently  Other  Topics Concern   Not on file  Social History Narrative   Not on file   Social Determinants of Health   Financial Resource Strain: Not on file  Food Insecurity: Not on file  Transportation Needs: Not on file  Physical Activity: Not on file  Stress: Not on file  Social Connections: Not on file   Additional Social History:  Specify valuables returned:  (none)                      Sleep: Fair  Appetite:  Fair  Current Medications: Current Facility-Administered Medications  Medication Dose Route Frequency Provider Last Rate Last Admin   acetaminophen (TYLENOL) tablet 650 mg  650 mg Oral Q6H PRN Nolia Tschantz T, MD   650 mg at 12/26/22 0831   alum & mag hydroxide-simeth (MAALOX/MYLANTA) 200-200-20 MG/5ML suspension 30 mL  30 mL Oral Q4H PRN Dereon Williamsen T, MD   30 mL at 04/26/22 3419   aspirin EC tablet 81 mg  81 mg Oral Daily Christifer Chapdelaine, [name], MD   81 mg at 12/26/22 0831   atorvastatin (LIPITOR) tablet 20 mg  20 mg Oral Daily Grover Woodfield T, MD   20 mg at 12/26/22 0831   atropine 1 % ophthalmic solution 1 drop  1 drop Sublingual QID [name], MD   1 drop at 12/26/22 1213   benztropine (COGENTIN) tablet 0.5 mg  0.5 mg  Oral BID Ulysse Siemen, [name], MD   0.5 mg at 12/26/22 0831   clonazePAM (KLONOPIN) tablet 1 mg  1 mg Oral TID PRN He, [name], MD   1 mg at 12/22/22 2133   cloZAPine (CLOZARIL) tablet 300 mg  300 mg Oral QHS Arneda Sappington T, MD   300 mg at 12/25/22 2344   diphenhydrAMINE (BENADRYL) capsule 50 mg  50 mg Oral Q6H PRN Teira Arcilla T, MD   50 mg at 12/14/22 1709   Or   diphenhydrAMINE (BENADRYL) injection 50 mg  50 mg Intramuscular Q6H PRN Salome Cozby T, MD       feeding supplement (ENSURE ENLIVE / ENSURE PLUS) liquid 237 mL  237 mL Oral BID BM Pedro Whiters T, MD   237 mL at 12/26/22 1011   gabapentin (NEURONTIN) capsule 300 mg  300 mg Oral TID Zared Knoth T, MD   300 mg at 12/26/22 1010   haloperidol (HALDOL) tablet 2 mg  2 mg Oral TID Tanishka Drolet T, MD   2 mg  at 12/26/22 1010   haloperidol (HALDOL) tablet 5 mg  5 mg Oral Q6H PRN Chelle Cayton, [name], MD   5 mg at 12/14/22 1709   Or   haloperidol lactate (HALDOL) injection 5 mg  5 mg Intramuscular Q6H PRN Robertha Staples T, MD       lisinopril (ZESTRIL) tablet 5 mg  5 mg Oral Daily Oretta Berkland T, MD   5 mg at 12/26/22 0831   magnesium hydroxide (MILK OF MAGNESIA) suspension 30 mL  30 mL Oral Daily PRN Sharonne Ricketts T, MD   30 mL at 12/03/22 1526   metoprolol succinate (TOPROL-XL) 24 hr tablet 25 mg  25 mg Oral Daily Genie Mirabal T, MD   25 mg at 12/26/22 0831   ondansetron (ZOFRAN) tablet 4 mg  4 mg Oral Q8H PRN [name], NP   4 mg at 09/06/22 2320   paliperidone (INVEGA SUSTENNA) injection 156 mg  156 mg Intramuscular Q28 days Larene Ascencio T, MD   156 mg at 12/18/22 1654   paliperidone (INVEGA) 24 hr tablet 6 mg  6 mg Oral QHS Baby Gieger T, MD   6 mg at 12/25/22 2345   senna-docusate (Senokot-S) tablet 2 tablet  2 tablet Oral QHS PRN [name], DO   2 tablet at 09/15/22 2110   temazepam (RESTORIL) capsule 15 mg  15 mg Oral QHS Elizabethanne Lusher T, MD   15 mg at 12/25/22 2345   ziprasidone (GEODON) injection 20 mg  20 mg Intramuscular Q12H PRN Cavion Faiola, [name], MD   20 mg at 07/08/22 1453    Lab Results: No results found for this or any previous visit (from the past 74 hour(s)).  Blood Alcohol level:  Lab Results  Component Value Date   ETH <10 49/17/9150    Metabolic Disorder Labs: Lab Results  Component Value Date   HGBA1C 5.5 04/10/2022   MPG 111.15 04/10/2022   No results found for: "PROLACTIN" Lab Results  Component Value Date   CHOL 167 11/20/2021   TRIG 107 11/20/2021   HDL 26 (L) 11/20/2021   CHOLHDL 6.4 11/20/2021   VLDL 21 11/20/2021   LDLCALC 120 (H) 11/20/2021    Physical Findings: AIMS: Facial and Oral Movements Muscles of Facial Expression: None, normal Lips and Perioral Area: None, normal Jaw: None, normal Tongue: None, normal,Extremity  Movements Upper (arms, wrists, hands, fingers): None, normal Lower (legs, knees, ankles, toes): None, normal, Trunk  Movements Neck, shoulders, hips: None, normal, Overall Severity Severity of abnormal movements (highest score from questions above): None, normal Incapacitation due to abnormal movements: None, normal Patient's awareness of abnormal movements (rate only patient's report): No Awareness, Dental Status Current problems with teeth and/or dentures?: No Does patient usually wear dentures?: No  CIWA:    COWS:     Musculoskeletal: Strength & Muscle Tone: within normal limits Gait & Station: normal Patient leans: N/A  Psychiatric Specialty Exam:  Presentation  General Appearance:  Appropriate for Environment; Casual; Neat; Well Groomed  Eye Contact: Fair  Speech: Slow  Speech Volume: Decreased  Handedness: Left   Mood and Affect  Mood: Anxious  Affect: Congruent   Thought Process  Thought Processes: Goal Directed  Descriptions of Associations:Circumstantial  Orientation:Partial  Thought Content:Scattered  History of Schizophrenia/Schizoaffective disorder:Yes  Duration of Psychotic Symptoms:Greater than six months  Hallucinations:No data recorded Ideas of Reference:None  Suicidal Thoughts:No data recorded Homicidal Thoughts:No data recorded  Sensorium  Memory: Remote Good  Judgment: Fair  Insight: Fair   Art therapist  Concentration: Fair  Attention Span: Fair  Recall: [employer] of Knowledge: Fair  Language: Fair   Psychomotor Activity  Psychomotor Activity:No data recorded  Assets  Assets:No data recorded  Sleep  Sleep:No data recorded   Physical Exam: Physical Exam Vitals and nursing note reviewed.  Constitutional:      Appearance: Normal appearance.  HENT:     Head: Normocephalic and atraumatic.     Mouth/Throat:     Pharynx: Oropharynx is clear.  Eyes:     Pupils: Pupils are equal, round,  and reactive to light.  Cardiovascular:     Rate and Rhythm: Normal rate and regular rhythm.  Pulmonary:     Effort: Pulmonary effort is normal.     Breath sounds: Normal breath sounds.  Abdominal:     General: Abdomen is flat.     Palpations: Abdomen is soft.  Musculoskeletal:        General: Normal range of motion.  Skin:    General: Skin is warm and dry.  Neurological:     General: No focal deficit present.     Mental Status: He is alert. Mental status is at baseline.  Psychiatric:        Attention and Perception: Attention normal.        Mood and Affect: Mood normal. Affect is blunt.        Speech: He is noncommunicative.    Review of Systems  Unable to perform ROS: Language   Blood pressure 115/82, pulse 88, temperature (!) 97.5 F (36.4 C), temperature source Oral, resp. rate 15, height 5\' 8"  (1.727 m), weight 85.8 kg, SpO2 100 %. Body mass index is 28.76 kg/m.   Treatment Plan Summary: Plan no change to plan.  Continue to focus pretty much entirely on discharge planning no update to that.  , MD 12/26/2022, 2:21 PM

## 2022-12-26 NOTE — Plan of Care (Signed)
D: Patient alert and oriented. Patient endorses right lower leg pain. PRN tylenol requested by patient.  Patient no longer endorses pain when reassessed. Patient denies anxiety and depression. Patient denies SI/HI/AVH. Patient has been interactive with staff and peers on the unit throughout shift.  A: Scheduled medications administered to patient, per MD orders.  Support and encouragement provided to patient.  Q15 minute safety checks maintained.   R: Patient compliant with medication administration and treatment plan. No adverse drug reactions noted. Patient remains safe on the unit at this time. Problem: Education: Goal: Knowledge of General Education information will improve Description: Including pain rating scale, medication(s)/side effects and non-pharmacologic comfort measures Outcome: Progressing   Problem: Clinical Measurements: Goal: Ability to maintain clinical measurements within normal limits will improve Outcome: Progressing   Problem: Nutrition: Goal: Adequate nutrition will be maintained Outcome: Progressing   Problem: Education: Goal: Will be free of psychotic symptoms Outcome: Progressing

## 2022-12-26 NOTE — Group Note (Signed)
[facility] LCSW Group Therapy Note   Group Date: 12/26/2022 Start Time: 1300 End Time: 1400   Type of Therapy/Topic:  Group Therapy:  Emotion Regulation  Participation Level:  None   Mood:  Description of Group:    The purpose of this group is to assist patients in learning to regulate negative emotions and experience positive emotions. Patients will be guided to discuss ways in which they have been vulnerable to their negative emotions. These vulnerabilities will be juxtaposed with experiences of positive emotions or situations, and patients challenged to use positive emotions to combat negative ones. Special emphasis will be placed on coping with negative emotions in conflict situations, and patients will process healthy conflict resolution skills.  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  Therapeutic Modalities:   Cognitive Behavioral Therapy Feelings Identification Dialectical Behavioral Therapy   [name], [place]

## 2022-12-26 NOTE — Progress Notes (Signed)
Recreation Therapy Notes  Date: 12/26/2022   Time: 10:30 am   Location: Courtyard      Behavioral response: N/A   Intervention Topic: Values    Discussion/Intervention: Patient refused to attend group.    Clinical Observations/Feedback:  Patient refused to attend group.    Silvio Sausedo LRT/CTRS          Shakaria Raphael 12/26/2022 11:05 AM

## 2022-12-26 NOTE — Plan of Care (Signed)
Pt exhibited increased agitation this shift and refused to take all his nighttime medications until nearly midnight.  Pt's affect was markedly sullen and he was looking down at the floor.  Patient ambulated into an empty bedroom and had to be redirected out by staff and Neurosurgeon.  Patient was sitting in the hallway and began yelling "Adam [name]!  Adam [name]!  Adam [name]!"  After this he was yelling unintelligible phrases.  Around 11:30 the patient came to the medication room smiling and calmly took his night medications.  Patient expressed no further complaints and calmly went to his room.  Continued monitoring via q 15 minute checks.        Problem: Coping: Goal: Ability to identify and develop effective coping behavior will improve Outcome: Progressing   Problem: Coping: Goal: Ability to identify and develop effective coping behavior will improve Outcome: Progressing

## 2022-12-26 NOTE — Progress Notes (Signed)
Patient calm and pleasant during assessment. Pt denies SI/HI/AVH. Pt observed interacting appropriately with staff and peers on the unit. Pt compliant with medication administration per MD orders. Pt given education, support, and encouragement to be active in his treatment plan. Pt being monitored Q 15 minutes for safety per unit protocol, remains safe on the unit    

## 2022-12-27 NOTE — Group Note (Signed)
LCSW Group Therapy Note   Group Date: 12/27/2022 Start Time: 1300 End Time: 1400   Type of Therapy and Topic:  Group Therapy: Boundaries  Participation Level:  None  Description of Group: This group will address the use of boundaries in their personal lives. Patients will explore why boundaries are important, the difference between healthy and unhealthy boundaries, and negative and postive outcomes of different boundaries and will look at how boundaries can be crossed.  Patients will be encouraged to identify current boundaries in their own lives and identify what kind of boundary is being set. Facilitators will guide patients in utilizing problem-solving interventions to address and correct types boundaries being used and to address when no boundary is being used. Understanding and applying boundaries will be explored and addressed for obtaining and maintaining a balanced life. Patients will be encouraged to explore ways to assertively make their boundaries and needs known to significant others in their lives, using other group members and facilitator for role play, support, and feedback.  Therapeutic Goals:  1.  Patient will identify areas in their life where setting clear boundaries could be  used to improve their life.  2.  Patient will identify signs/triggers that a boundary is not being respected. 3.  Patient will identify two ways to set boundaries in order to achieve balance in  their lives: 4.  Patient will demonstrate ability to communicate their needs and set boundaries  through discussion and/or role plays  Summary of Patient Progress:   X  Therapeutic Modalities:   Cognitive Behavioral Therapy Solution-Focused Therapy  Rozann Lesches, [place] 12/27/2022  2:55 PM

## 2022-12-27 NOTE — Plan of Care (Signed)
D: Patient alert and oriented. Patient denies pain. Patient denies anxiety and depression. Patient denies SI/HI/AVH. Before lunch patient became irritable yelling "ipad" after seeing another patient fill out a discharge survey. Patient was yelling and banging on the windows of the nurses station. About 30 minutes later patient began behaving at baseline.  Patient has been interactive with staff and peers the rest of the shift.   A: Scheduled medications administered to patient, per MD orders.  Support and encouragement provided to patient.  Q15 minute safety checks maintained.   R: Patient compliant with medication administration and treatment plan. No adverse drug reactions noted. Patient remains safe on the unit at this time. Problem: Education: Goal: Knowledge of General Education information will improve Description: Including pain rating scale, medication(s)/side effects and non-pharmacologic comfort measures Outcome: Progressing   Problem: Health Behavior/Discharge Planning: Goal: Ability to manage health-related needs will improve Outcome: Progressing   Problem: Clinical Measurements: Goal: Ability to maintain clinical measurements within normal limits will improve Outcome: Progressing   Problem: Nutrition: Goal: Adequate nutrition will be maintained Outcome: Progressing   Problem: Pain Managment: Goal: General experience of comfort will improve Outcome: Progressing   Problem: Education: Goal: Will be free of psychotic symptoms Outcome: Progressing

## 2022-12-27 NOTE — [facility] Group Notes (Signed)
[facility] Group Notes:  (Nursing/MHT/Case Management/Adjunct)  Date:  12/27/2022  Time:  8:46 PM  Type of Therapy:   Wrap up  Participation Level:  Minimal  Participation Quality:  Appropriate  Affect:  Appropriate  Cognitive:  Alert  Insight:  Good  Engagement in Group:  Engaged and he says his name   Modes of Intervention:  Support  Summary of Progress/Problems:  Nehemiah Settle 12/27/2022, 8:46 PM

## 2022-12-27 NOTE — [facility] Counselor (Signed)
CSW followed up with Adam [name] regarding possible group home placement.  He reports that he will come to visit around 10 pr 10:30 AM on 12/28/2022.  Assunta Curtis, MSW, LCSW 12/27/2022 3:52 PM

## 2022-12-27 NOTE — Progress Notes (Signed)
[facility] MD Progress Note  12/27/2022 11:38 AM Adam [name]  MRN:  [id number] Subjective: Follow-up for this patient with schizophrenia.  Today he seemed to be in a bit of a bad mood.  He noticed that one of the staff members was using an iPad for work and became fixated on it demanding it be given to him.  It had to be explained multiple times that he no longer had an iPad and that his phone was still available for him.  Patient was not violent or threatening but was loud and intrusive for a while.  Eventually was given as needed medicine.  Otherwise no change to overall condition. 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)   Stroke (HCC)   HTN (hypertension)   HLD (hyperlipidemia)   CAD (coronary artery disease)   Chronic systolic CHF (congestive heart failure) (HCC)   Anxiety   Infection of left earlobe  Total Time spent with patient: 30 minutes  Past Psychiatric History: Past history of schizophrenia and stroke  Past Medical History:  Past Medical History:  Diagnosis Date   Myocardial infarction (HCC)    Schizophrenia (HCC)    Stroke (HCC)     Past Surgical History:  Procedure Laterality Date   NO PAST SURGERIES     Family History:  Family History  Problem Relation Age of Onset   Hypertension Mother    Family Psychiatric  History: See previous Social History:  Social History   Substance and Sexual Activity  Alcohol Use Not Currently     Social History   Substance and Sexual Activity  Drug Use Not Currently    Social History   Socioeconomic History   Marital status: Single    Spouse name: Not on file   Number of children: Not on file   Years of education: Not on file   Highest education level: Not on file  Occupational History   Not on file   Tobacco Use   Smoking status: Never    Passive exposure: Never   Smokeless tobacco: Never  Vaping Use   Vaping Use: Unknown  Substance and Sexual Activity   Alcohol use: Not Currently   Drug use: Not Currently   Sexual activity: Not Currently  Other Topics Concern   Not on file  Social History Narrative   Not on file   Social Determinants of Health   Financial Resource Strain: Not on file  Food Insecurity: Not on file  Transportation Needs: Not on file  Physical Activity: Not on file  Stress: Not on file  Social Connections: Not on file   Additional Social History:  Specify valuables returned:  (none)                      Sleep: Fair  Appetite:  Fair  Current Medications: Current Facility-Administered Medications  Medication Dose Route Frequency Provider Last Rate Last Admin   acetaminophen (TYLENOL) tablet 650 mg  650 mg Oral Q6H PRN Chiquita Heckert T, MD   650 mg at 12/26/22 0831   alum & mag hydroxide-simeth (MAALOX/MYLANTA) 200-200-20 MG/5ML suspension 30 mL  30 mL Oral Q4H PRN Zahki Hoogendoorn T, MD   30 mL at 04/26/22 0721   aspirin EC tablet 81 mg  81 mg Oral Daily Greidy Sherard, [name], MD   81 mg at 12/27/22 [phone]  atorvastatin (LIPITOR) tablet 20 mg  20 mg Oral Daily Avner Stroder,  T, MD   20 mg at 12/27/22 5852   atropine 1 % ophthalmic solution 1 drop  1 drop Sublingual QID [name], MD   1 drop at 12/27/22 0824   benztropine (COGENTIN) tablet 0.5 mg  0.5 mg Oral BID Merlinda Wrubel,  T, MD   0.5 mg at 12/27/22 7782   clonazePAM (KLONOPIN) tablet 1 mg  1 mg Oral TID PRN He, [name], MD   1 mg at 12/22/22 2133   cloZAPine (CLOZARIL) tablet 300 mg  300 mg Oral QHS Kayona Foor T, MD   300 mg at 12/26/22 2123   diphenhydrAMINE (BENADRYL) capsule 50 mg  50 mg Oral Q6H PRN Montrey Buist T, MD   50 mg at 12/14/22 1709   Or   diphenhydrAMINE (BENADRYL) injection 50 mg  50 mg Intramuscular Q6H PRN Elina Streng T, MD       feeding supplement (ENSURE ENLIVE / ENSURE PLUS)  liquid 237 mL  237 mL Oral BID BM Emiley Digiacomo T, MD   237 mL at 12/26/22 1709   gabapentin (NEURONTIN) capsule 300 mg  300 mg Oral TID Aileana Hodder T, MD   300 mg at 12/26/22 2123   haloperidol (HALDOL) tablet 2 mg  2 mg Oral TID Veda Arrellano, [name], MD   2 mg at 12/26/22 2123   haloperidol (HALDOL) tablet 5 mg  5 mg Oral Q6H PRN , [name], MD   5 mg at 12/14/22 1709   Or   haloperidol lactate (HALDOL) injection 5 mg  5 mg Intramuscular Q6H PRN Wilma Wuthrich T, MD       lisinopril (ZESTRIL) tablet 5 mg  5 mg Oral Daily Quinnley Colasurdo T, MD   5 mg at 12/27/22 4235   magnesium hydroxide (MILK OF MAGNESIA) suspension 30 mL  30 mL Oral Daily PRN Arisbeth Purrington T, MD   30 mL at 12/03/22 1526   metoprolol succinate (TOPROL-XL) 24 hr tablet 25 mg  25 mg Oral Daily Malon Branton T, MD   25 mg at 12/27/22 0823   ondansetron (ZOFRAN) tablet 4 mg  4 mg Oral Q8H PRN [name], NP   4 mg at 09/06/22 2320   paliperidone (INVEGA SUSTENNA) injection 156 mg  156 mg Intramuscular Q28 days Leandrew Keech T, MD   156 mg at 12/18/22 1654   paliperidone (INVEGA) 24 hr tablet 6 mg  6 mg Oral QHS Michal Callicott T, MD   6 mg at 12/26/22 2123   senna-docusate (Senokot-S) tablet 2 tablet  2 tablet Oral QHS PRN [name], DO   2 tablet at 09/15/22 2110   temazepam (RESTORIL) capsule 15 mg  15 mg Oral QHS Lovelee Forner T, MD   15 mg at 12/26/22 2123   ziprasidone (GEODON) injection 20 mg  20 mg Intramuscular Q12H PRN , [name], MD   20 mg at 07/08/22 1453    Lab Results: No results found for this or any previous visit (from the past 36 hour(s)).  Blood Alcohol level:  Lab Results  Component Value Date   ETH <10 36/14/4315    Metabolic Disorder Labs: Lab Results  Component Value Date   HGBA1C 5.5 04/10/2022   MPG 111.15 04/10/2022   No results found for: "PROLACTIN" Lab Results  Component Value Date   CHOL 167 11/20/2021   TRIG 107 11/20/2021   HDL 26 (L) 11/20/2021   CHOLHDL 6.4  11/20/2021  VLDL 21 11/20/2021   LDLCALC 120 (H) 11/20/2021    Physical Findings: AIMS: Facial and Oral Movements Muscles of Facial Expression: None, normal Lips and Perioral Area: None, normal Jaw: None, normal Tongue: None, normal,Extremity Movements Upper (arms, wrists, hands, fingers): None, normal Lower (legs, knees, ankles, toes): None, normal, Trunk Movements Neck, shoulders, hips: None, normal, Overall Severity Severity of abnormal movements (highest score from questions above): None, normal Incapacitation due to abnormal movements: None, normal Patient's awareness of abnormal movements (rate only patient's report): No Awareness, Dental Status Current problems with teeth and/or dentures?: No Does patient usually wear dentures?: No  CIWA:    COWS:     Musculoskeletal: Strength & Muscle Tone: within normal limits Gait & Station: normal Patient leans: N/A  Psychiatric Specialty Exam:  Presentation  General Appearance:  Appropriate for Environment; Casual; Neat; Well Groomed  Eye Contact: Fair  Speech: Slow  Speech Volume: Decreased  Handedness: Left   Mood and Affect  Mood: Anxious  Affect: Congruent   Thought Process  Thought Processes: Goal Directed  Descriptions of Associations:Circumstantial  Orientation:Partial  Thought Content:Scattered  History of Schizophrenia/Schizoaffective disorder:Yes  Duration of Psychotic Symptoms:Greater than six months  Hallucinations:No data recorded Ideas of Reference:None  Suicidal Thoughts:No data recorded Homicidal Thoughts:No data recorded  Sensorium  Memory: Remote Good  Judgment: Fair  Insight: Fair   Community education officer  Concentration: Fair  Attention Span: Fair  Recall: [place] of Knowledge: Fair  Language: Fair   Psychomotor Activity  Psychomotor Activity:No data recorded  Assets  Assets:No data recorded  Sleep  Sleep:No data recorded   Physical  Exam: Physical Exam Vitals reviewed.  Constitutional:      Appearance: Normal appearance.  HENT:     Head: Normocephalic and atraumatic.     Mouth/Throat:     Pharynx: Oropharynx is clear.  Eyes:     Pupils: Pupils are equal, round, and reactive to light.  Cardiovascular:     Rate and Rhythm: Normal rate and regular rhythm.  Pulmonary:     Effort: Pulmonary effort is normal.     Breath sounds: Normal breath sounds.  Abdominal:     General: Abdomen is flat.     Palpations: Abdomen is soft.  Musculoskeletal:        General: Normal range of motion.  Skin:    General: Skin is warm and dry.  Neurological:     General: No focal deficit present.     Mental Status: He is alert. Mental status is at baseline.  Psychiatric:        Attention and Perception: He is inattentive.        Mood and Affect: Affect is blunt and angry.        Speech: He is noncommunicative.        Thought Content: Thought content normal.    Review of Systems  Unable to perform ROS: Language   Blood pressure 102/75, pulse 94, temperature 97.8 F (36.6 C), temperature source Oral, resp. rate 17, height 5\' 8"  (1.727 m), weight 85.8 kg, SpO2 98 %. Body mass index is 28.76 kg/m.   Treatment Plan Summary: Plan today just seemed to be a day when he was in a grumpy mood.  This happens from time to time.  Still no threats of violence or implication that he was about to become violent or aggressive.  [place] willingly.  Seems to be calming down.  Overall plan still focused on discharge.  Alethia Berthold, MD 12/27/2022, 11:38  AM

## 2022-12-27 NOTE — BHH Counselor (Signed)
CSW team met with [employer] care manager, Delle Reining., along with [facility] leadership Dagoberto Ligas and Velva Harman.) regarding case progression on 12/26/22 at 10am.   Case was discussed. Still no word about [facility]. [facility] has not been responsive. They were also updated that his information has been submitted to [facility]. Concerns around placement were discussed and some of the reasons that facilities were turning pt down. No other concerns expressed. Contact ended without incident.   [name]. Guerry Bruin, MSW, LCSW, [facility] 12/27/2022 4:00 PM

## 2022-12-27 NOTE — Progress Notes (Signed)
Patient calm and pleasant during assessment. Pt denies SI/HI/AVH. Pt observed interacting appropriately with staff and peers on the unit. Pt compliant with medication administration per MD orders. Pt given education, support, and encouragement to be active in his treatment plan. Pt being monitored Q 15 minutes for safety per unit protocol, remains safe on the unit    

## 2022-12-28 NOTE — Progress Notes (Signed)
Pt denies SI/HI/AVH and verbally agrees to approach staff if these become apparent or before harming themselves/others. Rates pain on face scale 4/10.  Scheduled medications administered to pt, per MD orders. RN provided support and encouragement to pt. Q15 min safety checks implemented and continued. Pt safe on the unit. RN will continue to monitor and intervene as needed.  12/28/22 0800  Psych Admission Type (Psych Patients Only)  Admission Status Involuntary  Psychosocial Assessment  Patient Complaints None  Eye Contact Fair  Facial Expression Animated  Affect Anxious  Speech Aphasic  Interaction Childlike  Motor Activity Restless;Slow  Appearance/Hygiene Unremarkable  Behavior Characteristics Cooperative;Appropriate to situation;Calm  Mood Preoccupied  Aggressive Behavior  Effect No apparent injury  Thought Process  Coherency WDL  Content Preoccupation  Delusions None reported or observed  Perception WDL  Hallucination None reported or observed  Judgment Limited  Confusion None  Danger to Self  Current suicidal ideation? Denies  Danger to Others  Danger to Others None reported or observed  Danger to Others Abnormal  Harmful Behavior to others No threats or harm toward other people  Destructive Behavior No threats or harm toward property

## 2022-12-28 NOTE — Group Note (Signed)
[place] LCSW Group Therapy Note   Group Date: 12/28/2022 Start Time: 1300 End Time: 1400  Type of Therapy and Topic:  Group Therapy:  Feelings around Relapse and Recovery  Participation Level:  None    Description of Group:    Patients in this group will discuss emotions they experience before and after a relapse. They will process how experiencing these feelings, or avoidance of experiencing them, relates to having a relapse. Facilitator will guide patients to explore emotions they have related to recovery. Patients will be encouraged to process which emotions are more powerful. They will be guided to discuss the emotional reaction significant others in their lives may have to patients' relapse or recovery. Patients will be assisted in exploring ways to respond to the emotions of others without this contributing to a relapse.  Therapeutic Goals: Patient will identify two or more emotions that lead to relapse for them:  Patient will identify two emotions that result when they relapse:  Patient will identify two emotions related to recovery:  Patient will demonstrate ability to communicate their needs through discussion and/or role plays.   Summary of Patient Progress: Patient was present for the entirety of the group process. He said "Leeroy Bock" once during the conversation but otherwise sat quietly at the table intermittently resting his head.    Therapeutic Modalities:   Cognitive Behavioral Therapy Solution-Focused Therapy Assertiveness Training Relapse Prevention Therapy   Shirl Harris, LCSW

## 2022-12-28 NOTE — Progress Notes (Signed)
[facility] MD Progress Note  12/28/2022 1:46 PM Adam [name]  MRN:  [id number] Subjective: Follow-up patient with schizophrenia.  Patient has no new complaint.  No change to behavior. 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)   Stroke (HCC)   HTN (hypertension)   HLD (hyperlipidemia)   CAD (coronary artery disease)   Chronic systolic CHF (congestive heart failure) (HCC)   Anxiety   Infection of left earlobe  Total Time spent with patient: 30 minutes  Past Psychiatric History: Schizophrenia  Past Medical History:  Past Medical History:  Diagnosis Date   Myocardial infarction (HCC)    Schizophrenia (HCC)    Stroke (HCC)     Past Surgical History:  Procedure Laterality Date   NO PAST SURGERIES     Family History:  Family History  Problem Relation Age of Onset   Hypertension Mother    Family Psychiatric  History: None Social History:  Social History   Substance and Sexual Activity  Alcohol Use Not Currently     Social History   Substance and Sexual Activity  Drug Use Not Currently    Social History   Socioeconomic History   Marital status: Single    Spouse name: Not on file   Number of children: Not on file   Years of education: Not on file   Highest education level: Not on file  Occupational History   Not on file  Tobacco Use   Smoking status: Never    Passive exposure: Never   Smokeless tobacco: Never  Vaping Use   Vaping Use: Unknown  Substance and Sexual Activity   Alcohol use: Not Currently   Drug use: Not Currently   Sexual activity: Not Currently  Other Topics Concern   Not on file  Social History Narrative   Not on file   Social Determinants of Health   Financial Resource Strain: Not on file  Food Insecurity: Not on file   Transportation Needs: Not on file  Physical Activity: Not on file  Stress: Not on file  Social Connections: Not on file   Additional Social History:  Specify valuables returned:  (none)                      Sleep: Fair  Appetite:  Fair  Current Medications: Current Facility-Administered Medications  Medication Dose Route Frequency Provider Last Rate Last Admin   acetaminophen (TYLENOL) tablet 650 mg  650 mg Oral Q6H PRN Wreatha Sturgeon T, MD   650 mg at 12/28/22 0800   alum & mag hydroxide-simeth (MAALOX/MYLANTA) 200-200-20 MG/5ML suspension 30 mL  30 mL Oral Q4H PRN Perle Brickhouse T, MD   30 mL at 04/26/22 3557   aspirin EC tablet 81 mg  81 mg Oral Daily Hart Haas T, MD   81 mg at 12/28/22 0800   atorvastatin (LIPITOR) tablet 20 mg  20 mg Oral Daily Hibba Schram T, MD   20 mg at 12/28/22 0800   atropine 1 % ophthalmic solution 1 drop  1 drop Sublingual QID [name], MD   1 drop at 12/28/22 1146   benztropine (COGENTIN) tablet 0.5 mg  0.5 mg Oral BID Veronica Fretz T, MD   0.5 mg at 12/28/22 0800   clonazePAM (KLONOPIN) tablet 1 mg  1 mg Oral TID PRN He, [name], MD   1 mg  at 12/22/22 2133   cloZAPine (CLOZARIL) tablet 300 mg  300 mg Oral QHS Danon Lograsso T, MD   300 mg at 12/27/22 2106   diphenhydrAMINE (BENADRYL) capsule 50 mg  50 mg Oral Q6H PRN Treylan Mcclintock T, MD   50 mg at 12/14/22 1709   Or   diphenhydrAMINE (BENADRYL) injection 50 mg  50 mg Intramuscular Q6H PRN Dezeray Puccio T, MD       feeding supplement (ENSURE ENLIVE / ENSURE PLUS) liquid 237 mL  237 mL Oral BID BM Chastin Garlitz T, MD   237 mL at 12/28/22 0941   gabapentin (NEURONTIN) capsule 300 mg  300 mg Oral TID Javell Blackburn T, MD   300 mg at 12/28/22 0940   haloperidol (HALDOL) tablet 2 mg  2 mg Oral TID Joshu Furukawa, [name], MD   2 mg at 12/28/22 0940   haloperidol (HALDOL) tablet 5 mg  5 mg Oral Q6H PRN Sharol Croghan T, MD   5 mg at 12/14/22 1709   Or   haloperidol lactate (HALDOL) injection 5 mg  5 mg  Intramuscular Q6H PRN Niyah Mamaril T, MD       lisinopril (ZESTRIL) tablet 5 mg  5 mg Oral Daily Arlesia Kiel T, MD   5 mg at 12/28/22 0800   magnesium hydroxide (MILK OF MAGNESIA) suspension 30 mL  30 mL Oral Daily PRN Amontae Ng T, MD   30 mL at 12/03/22 1526   metoprolol succinate (TOPROL-XL) 24 hr tablet 25 mg  25 mg Oral Daily Para Cossey T, MD   25 mg at 12/28/22 0800   ondansetron (ZOFRAN) tablet 4 mg  4 mg Oral Q8H PRN [name], NP   4 mg at 09/06/22 2320   paliperidone (INVEGA SUSTENNA) injection 156 mg  156 mg Intramuscular Q28 days Kato Wieczorek T, MD   156 mg at 12/18/22 1654   paliperidone (INVEGA) 24 hr tablet 6 mg  6 mg Oral QHS Avannah Decker T, MD   6 mg at 12/27/22 2107   senna-docusate (Senokot-S) tablet 2 tablet  2 tablet Oral QHS PRN [name], DO   2 tablet at 09/15/22 2110   temazepam (RESTORIL) capsule 15 mg  15 mg Oral QHS Adelaida Reindel T, MD   15 mg at 12/27/22 2106   ziprasidone (GEODON) injection 20 mg  20 mg Intramuscular Q12H PRN Odus Clasby, [name], MD   20 mg at 07/08/22 1453    Lab Results: No results found for this or any previous visit (from the past 48 hour(s)).  Blood Alcohol level:  Lab Results  Component Value Date   ETH <10 35/00/9381    Metabolic Disorder Labs: Lab Results  Component Value Date   HGBA1C 5.5 04/10/2022   MPG 111.15 04/10/2022   No results found for: "PROLACTIN" Lab Results  Component Value Date   CHOL 167 11/20/2021   TRIG 107 11/20/2021   HDL 26 (L) 11/20/2021   CHOLHDL 6.4 11/20/2021   VLDL 21 11/20/2021   LDLCALC 120 (H) 11/20/2021    Physical Findings: AIMS: Facial and Oral Movements Muscles of Facial Expression: None, normal Lips and Perioral Area: None, normal Jaw: None, normal Tongue: None, normal,Extremity Movements Upper (arms, wrists, hands, fingers): None, normal Lower (legs, knees, ankles, toes): None, normal, Trunk Movements Neck, shoulders, hips: None, normal, Overall  Severity Severity of abnormal movements (highest score from questions above): None, normal Incapacitation due to abnormal movements: None, normal Patient's awareness of abnormal movements (rate only  patient's report): No Awareness, Dental Status Current problems with teeth and/or dentures?: No Does patient usually wear dentures?: No  CIWA:    COWS:     Musculoskeletal: Strength & Muscle Tone: within normal limits Gait & Station: normal Patient leans: N/A  Psychiatric Specialty Exam:  Presentation  General Appearance:  Appropriate for Environment; Casual; Neat; Well Groomed  Eye Contact: Fair  Speech: Slow  Speech Volume: Decreased  Handedness: Left   Mood and Affect  Mood: Anxious  Affect: Congruent   Thought Process  Thought Processes: Goal Directed  Descriptions of Associations:Circumstantial  Orientation:Partial  Thought Content:Scattered  History of Schizophrenia/Schizoaffective disorder:Yes  Duration of Psychotic Symptoms:Greater than six months  Hallucinations:No data recorded Ideas of Reference:None  Suicidal Thoughts:No data recorded Homicidal Thoughts:No data recorded  Sensorium  Memory: Remote Good  Judgment: Fair  Insight: Fair   Community education officer  Concentration: Fair  Attention Span: Fair  Recall: [employer] of Knowledge: Fair  Language: Fair   Psychomotor Activity  Psychomotor Activity:No data recorded  Assets  Assets:No data recorded  Sleep  Sleep:No data recorded   Physical Exam: Physical Exam Vitals and nursing note reviewed.  Constitutional:      Appearance: Normal appearance.  HENT:     Head: Normocephalic and atraumatic.     Mouth/Throat:     Pharynx: Oropharynx is clear.  Eyes:     Pupils: Pupils are equal, round, and reactive to light.  Cardiovascular:     Rate and Rhythm: Normal rate and regular rhythm.  Pulmonary:     Effort: Pulmonary effort is normal.     Breath sounds: Normal  breath sounds.  Abdominal:     General: Abdomen is flat.     Palpations: Abdomen is soft.  Musculoskeletal:        General: Normal range of motion.  Skin:    General: Skin is warm and dry.  Neurological:     General: No focal deficit present.     Mental Status: He is alert. Mental status is at baseline.  Psychiatric:        Attention and Perception: He is inattentive.        Mood and Affect: Mood normal. Affect is blunt.        Speech: He is noncommunicative.    Review of Systems  Unable to perform ROS: Language   Blood pressure (!) 118/95, pulse 81, temperature 98 F (36.7 C), temperature source Oral, resp. rate 18, height 5\' 8"  (1.727 m), weight 85.8 kg, SpO2 99 %. Body mass index is 28.76 kg/m.   Treatment Plan Summary: Plan completely stable.  No change to plan.  He had a "interview" with the group home owner today.  No idea what the outcome is.  Continue to focus on discharge.  Alethia Berthold, MD 12/28/2022, 1:46 PM

## 2022-12-28 NOTE — Plan of Care (Signed)
  Problem: Self-Concept: Goal: Level of anxiety will decrease Outcome: Progressing   Problem: Clinical Measurements: Goal: Will remain free from infection Outcome: Progressing   Problem: Nutrition: Goal: Adequate nutrition will be maintained Outcome: Progressing   Problem: Coping: Goal: Level of anxiety will decrease Outcome: Progressing

## 2022-12-29 NOTE — Plan of Care (Signed)

## 2022-12-29 NOTE — Progress Notes (Signed)
Patient was cooperative with treatment, he denies SI, HI & AVH.  No new issues to report on shift at this time.

## 2022-12-29 NOTE — Progress Notes (Signed)
Pt presents aphasic, with pleasant mood. Adam [name] continues to be a boarding patient, long term placement issues. Patient is able to state one or two word sentences, this am stating to writer '' Adam [name]''  He denies any SI or HI . Completed self inventory and rates depression, hopelessness and anxiety at 0/10 on scale. Compliant with am medications. In no acute distress, ambulatory on the unit, eating and drinking well.  Pt is safe, will con't to monitor.

## 2022-12-29 NOTE — Progress Notes (Signed)
[facility] MD Progress Note  12/29/2022 1:07 PM Adam [name]  MRN:  [id number] Subjective: Adam [name] is seen on rounds.  No changes.  Nurses report no issues.  He has been in good controls. 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)   Stroke (HCC)   HTN (hypertension)   HLD (hyperlipidemia)   CAD (coronary artery disease)   Chronic systolic CHF (congestive heart failure) (HCC)   Anxiety   Infection of left earlobe  Total Time spent with patient: 15 minutes  Past Psychiatric History: CVA and schizophrenia  Past Medical History:  Past Medical History:  Diagnosis Date   Myocardial infarction (HCC)    Schizophrenia (HCC)    Stroke (HCC)     Past Surgical History:  Procedure Laterality Date   NO PAST SURGERIES     Family History:  Family History  Problem Relation Age of Onset   Hypertension Mother    Family Psychiatric  History: Unremarkable Social History:  Social History   Substance and Sexual Activity  Alcohol Use Not Currently     Social History   Substance and Sexual Activity  Drug Use Not Currently    Social History   Socioeconomic History   Marital status: Single    Spouse name: Not on file   Number of children: Not on file   Years of education: Not on file   Highest education level: Not on file  Occupational History   Not on file  Tobacco Use   Smoking status: Never    Passive exposure: Never   Smokeless tobacco: Never  Vaping Use   Vaping Use: Unknown  Substance and Sexual Activity   Alcohol use: Not Currently   Drug use: Not Currently   Sexual activity: Not Currently  Other Topics Concern   Not on file  Social History Narrative   Not on file   Social Determinants of Health   Financial Resource Strain: Not on file  Food  Insecurity: Not on file  Transportation Needs: Not on file  Physical Activity: Not on file  Stress: Not on file  Social Connections: Not on file   Additional Social History:  Specify valuables returned:  (none)                      Sleep: Good  Appetite:  Good  Current Medications: Current Facility-Administered Medications  Medication Dose Route Frequency Provider Last Rate Last Admin   acetaminophen (TYLENOL) tablet 650 mg  650 mg Oral Q6H PRN [name], [name], MD   650 mg at 12/29/22 0835   alum & mag hydroxide-simeth (MAALOX/MYLANTA) 200-200-20 MG/5ML suspension 30 mL  30 mL Oral Q4H PRN [name], [name], MD   30 mL at 04/26/22 9485   aspirin EC tablet 81 mg  81 mg Oral Daily [name], [name], MD   81 mg at 12/29/22 4627   atorvastatin (LIPITOR) tablet 20 mg  20 mg Oral Daily [name], [name], MD   20 mg at 12/29/22 0823   atropine 1 % ophthalmic solution 1 drop  1 drop Sublingual QID [name], MD   1 drop at 12/29/22 1202   benztropine (COGENTIN) tablet 0.5 mg  0.5 mg Oral BID [name], [name], MD   0.5 mg at 12/29/22 0823   clonazePAM (KLONOPIN) tablet 1 mg  1 mg Oral TID PRN  He, [name], MD   1 mg at 12/22/22 2133   cloZAPine (CLOZARIL) tablet 300 mg  300 mg Oral QHS [name], [name], MD   300 mg at 12/28/22 2121   diphenhydrAMINE (BENADRYL) capsule 50 mg  50 mg Oral Q6H PRN [name], [name], MD   50 mg at 12/14/22 1709   Or   diphenhydrAMINE (BENADRYL) injection 50 mg  50 mg Intramuscular Q6H PRN [name], [name], MD       feeding supplement (ENSURE ENLIVE / ENSURE PLUS) liquid 237 mL  237 mL Oral BID BM [name], [name], MD   237 mL at 12/29/22 0945   gabapentin (NEURONTIN) capsule 300 mg  300 mg Oral TID [name], [name], MD   300 mg at 12/29/22 1610   haloperidol (HALDOL) tablet 2 mg  2 mg Oral TID [name], [name], MD   2 mg at 12/29/22 9604   haloperidol (HALDOL) tablet 5 mg  5 mg Oral Q6H PRN [name], [name], MD   5 mg at 12/14/22 1709   Or   haloperidol lactate (HALDOL)  injection 5 mg  5 mg Intramuscular Q6H PRN [name], [name], MD       lisinopril (ZESTRIL) tablet 5 mg  5 mg Oral Daily [name], [name], MD   5 mg at 12/29/22 5409   magnesium hydroxide (MILK OF MAGNESIA) suspension 30 mL  30 mL Oral Daily PRN [name], [name], MD   30 mL at 12/03/22 1526   metoprolol succinate (TOPROL-XL) 24 hr tablet 25 mg  25 mg Oral Daily [name], [name], MD   25 mg at 12/29/22 0823   ondansetron (ZOFRAN) tablet 4 mg  4 mg Oral Q8H PRN [name], NP   4 mg at 09/06/22 2320   paliperidone (INVEGA SUSTENNA) injection 156 mg  156 mg Intramuscular Q28 days [name], [name], MD   156 mg at 12/18/22 1654   paliperidone (INVEGA) 24 hr tablet 6 mg  6 mg Oral QHS [name], [name], MD   6 mg at 12/28/22 2121   senna-docusate (Senokot-S) tablet 2 tablet  2 tablet Oral QHS PRN [name], DO   2 tablet at 09/15/22 2110   temazepam (RESTORIL) capsule 15 mg  15 mg Oral QHS [name], [name], MD   15 mg at 12/28/22 2121   ziprasidone (GEODON) injection 20 mg  20 mg Intramuscular Q12H PRN [name], [name], MD   20 mg at 07/08/22 1453    Lab Results: No results found for this or any previous visit (from the past 69 hour(s)).  Blood Alcohol level:  Lab Results  Component Value Date   ETH <10 81/19/1478    Metabolic Disorder Labs: Lab Results  Component Value Date   HGBA1C 5.5 04/10/2022   MPG 111.15 04/10/2022   No results found for: "PROLACTIN" Lab Results  Component Value Date   CHOL 167 11/20/2021   TRIG 107 11/20/2021   HDL 26 (L) 11/20/2021   CHOLHDL 6.4 11/20/2021   VLDL 21 11/20/2021   LDLCALC 120 (H) 11/20/2021    Physical Findings: AIMS: Facial and Oral Movements Muscles of Facial Expression: None, normal Lips and Perioral Area: None, normal Jaw: None, normal Tongue: None, normal,Extremity Movements Upper (arms, wrists, hands, fingers): None, normal Lower (legs, knees, ankles, toes): None, normal, Trunk Movements Neck, shoulders, hips: None,  normal, Overall Severity Severity of abnormal movements (highest score from questions above): None, normal Incapacitation due to abnormal movements: None, normal  Patient's awareness of abnormal movements (rate only patient's report): No Awareness, Dental Status Current problems with teeth and/or dentures?: No Does patient usually wear dentures?: No  CIWA:    COWS:     Musculoskeletal: Strength & Muscle Tone: within normal limits Gait & Station: normal Patient leans: N/A  Psychiatric Specialty Exam:  Presentation  General Appearance:  Appropriate for Environment; Casual; Neat; Well Groomed  Eye Contact: Fair  Speech: Slow  Speech Volume: Decreased  Handedness: Left   Mood and Affect  Mood: Anxious  Affect: Congruent   Thought Process  Thought Processes: Goal Directed  Descriptions of Associations:Circumstantial  Orientation:Partial  Thought Content:Scattered  History of Schizophrenia/Schizoaffective disorder:Yes  Duration of Psychotic Symptoms:Greater than six months  Hallucinations:No data recorded Ideas of Reference:None  Suicidal Thoughts:No data recorded Homicidal Thoughts:No data recorded  Sensorium  Memory: Remote Good  Judgment: Fair  Insight: Fair   Community education officer  Concentration: Fair  Attention Span: Fair  Recall: [employer] of Knowledge: Fair  Language: Fair   Psychomotor Activity  Psychomotor Activity:No data recorded  Assets  Assets:No data recorded  Sleep  Sleep:No data recorded   Physical Exam: Physical Exam Vitals and nursing note reviewed.  Constitutional:      Appearance: Normal appearance. He is normal weight.  Neurological:     General: No focal deficit present.     Mental Status: He is alert and oriented to person, place, and time.  Psychiatric:        Mood and Affect: Mood normal.        Behavior: Behavior normal.    Review of Systems  Constitutional: Negative.   HENT: Negative.     Eyes: Negative.   Respiratory: Negative.    Cardiovascular: Negative.   Gastrointestinal: Negative.   Genitourinary: Negative.   Musculoskeletal: Negative.   Skin: Negative.   Neurological: Negative.   Endo/Heme/Allergies: Negative.   Psychiatric/Behavioral: Negative.     Blood pressure (!) 118/95, pulse 81, temperature 98 F (36.7 C), temperature source Oral, resp. rate 18, height 5\' 8"  (1.727 m), weight 85.8 kg, SpO2 99 %. Body mass index is 28.76 kg/m.   Treatment Plan Summary: Daily contact with patient to assess and evaluate symptoms and progress in treatment, Medication management, and Plan continue current medications.  [name], DO 12/29/2022, 1:07 PM

## 2022-12-29 NOTE — [facility] Group Notes (Signed)
[facility] Group Notes:  (Nursing/MHT/Case Management/Adjunct)  Date:  12/29/2022  Time:  4:22 PM  Type of Therapy:  Psychoeducational Skills  Participation Level:  Active  Participation Quality:  Appropriate and Attentive  Affect:  Appropriate  Cognitive:  Alert and Appropriate  Insight:  Appropriate  Engagement in Group:  Engaged  Modes of Intervention:  Activity and Socialization  Summary of Progress/Problems:  Adam [name] Adam [name] Adam [name] 12/29/2022, 4:22 PM 

## 2022-12-30 NOTE — Plan of Care (Signed)
Patient presentation and behaviors at baseline this shift; cooperatave, pleasant, aphasic, denies SI HI AVH.  Pt is compliant with medications and was very excited about watching hockey on TV.  Pt is occasionally possessive of unit property such as the TV remote keeping it from other patients; redirection was provided.  Monitoring q 15 minutes for safety.

## 2022-12-30 NOTE — Progress Notes (Signed)
[facility] MD Progress Note  12/30/2022 11:22 AM Adam [name]  MRN:  [id number] Subjective: No changes with Adam [name].  No complaints.  He has been in good controls and nurses report no issues. 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[place])   Stroke (HCC)   HTN (hypertension)   HLD (hyperlipidemia)   CAD (coronary artery disease)   Chronic systolic CHF (congestive heart failure) (HCC)   Anxiety   Infection of left earlobe  Total Time spent with patient: 15 minutes  Past Psychiatric History: CVA and schizophrenia  Past Medical History:  Past Medical History:  Diagnosis Date   Myocardial infarction ([place])    Schizophrenia ([place])    Stroke ([place])     Past Surgical History:  Procedure Laterality Date   NO PAST SURGERIES     Family History:  Family History  Problem Relation Age of Onset   Hypertension Mother    Family Psychiatric  History: Unremarkable Social History:  Social History   Substance and Sexual Activity  Alcohol Use Not Currently     Social History   Substance and Sexual Activity  Drug Use Not Currently    Social History   Socioeconomic History   Marital status: Single    Spouse name: Not on file   Number of children: Not on file   Years of education: Not on file   Highest education level: Not on file  Occupational History   Not on file  Tobacco Use   Smoking status: Never    Passive exposure: Never   Smokeless tobacco: Never  Vaping Use   Vaping Use: Unknown  Substance and Sexual Activity   Alcohol use: Not Currently   Drug use: Not Currently   Sexual activity: Not Currently  Other Topics Concern   Not on file  Social History Narrative   Not on file   Social Determinants of Health   Financial Resource Strain: Not on file  Food  Insecurity: Not on file  Transportation Needs: Not on file  Physical Activity: Not on file  Stress: Not on file  Social Connections: Not on file   Additional Social History:  Specify valuables returned:  (none)                      Sleep: Good  Appetite:  Good  Current Medications: Current Facility-Administered Medications  Medication Dose Route Frequency Provider Last Rate Last Admin   acetaminophen (TYLENOL) tablet 650 mg  650 mg Oral Q6H PRN [name], [name], MD   650 mg at 12/30/22 0849   alum & mag hydroxide-simeth (MAALOX/MYLANTA) 200-200-20 MG/5ML suspension 30 mL  30 mL Oral Q4H PRN [name], [name], MD   30 mL at 04/26/22 7096   aspirin EC tablet 81 mg  81 mg Oral Daily [name], [name], MD   81 mg at 12/30/22 2836   atorvastatin (LIPITOR) tablet 20 mg  20 mg Oral Daily [name], [name], MD   20 mg at 12/30/22 6294   atropine 1 % ophthalmic solution 1 drop  1 drop Sublingual QID [name], MD   1 drop at 12/30/22 0825   benztropine (COGENTIN) tablet 0.5 mg  0.5 mg Oral BID [name], [name], MD   0.5 mg at 12/30/22 7654   clonazePAM (KLONOPIN) tablet 1 mg  1 mg Oral TID PRN He,  [name], MD   1 mg at 12/22/22 2133   cloZAPine (CLOZARIL) tablet 300 mg  300 mg Oral QHS [name], [name], MD   300 mg at 12/29/22 2147   diphenhydrAMINE (BENADRYL) capsule 50 mg  50 mg Oral Q6H PRN [name], [name], MD   50 mg at 12/14/22 1709   Or   diphenhydrAMINE (BENADRYL) injection 50 mg  50 mg Intramuscular Q6H PRN [name], [name], MD       feeding supplement (ENSURE ENLIVE / ENSURE PLUS) liquid 237 mL  237 mL Oral BID BM [name], [name], MD   237 mL at 12/30/22 0824   gabapentin (NEURONTIN) capsule 300 mg  300 mg Oral TID [name], [name], MD   300 mg at 12/30/22 4098   haloperidol (HALDOL) tablet 2 mg  2 mg Oral TID [name], [name], MD   2 mg at 12/30/22 1191   haloperidol (HALDOL) tablet 5 mg  5 mg Oral Q6H PRN [name], [name], MD   5 mg at 12/14/22 1709   Or   haloperidol lactate (HALDOL)  injection 5 mg  5 mg Intramuscular Q6H PRN [name], [name], MD       lisinopril (ZESTRIL) tablet 5 mg  5 mg Oral Daily [name], [name], MD   5 mg at 12/30/22 4782   magnesium hydroxide (MILK OF MAGNESIA) suspension 30 mL  30 mL Oral Daily PRN [name], [name], MD   30 mL at 12/03/22 1526   metoprolol succinate (TOPROL-XL) 24 hr tablet 25 mg  25 mg Oral Daily [name], [name], MD   25 mg at 12/30/22 0822   ondansetron (ZOFRAN) tablet 4 mg  4 mg Oral Q8H PRN [name], NP   4 mg at 09/06/22 2320   paliperidone (INVEGA SUSTENNA) injection 156 mg  156 mg Intramuscular Q28 days [name], [name], MD   156 mg at 12/18/22 1654   paliperidone (INVEGA) 24 hr tablet 6 mg  6 mg Oral QHS [name], [name], MD   6 mg at 12/29/22 2146   senna-docusate (Senokot-S) tablet 2 tablet  2 tablet Oral QHS PRN [name], DO   2 tablet at 09/15/22 2110   temazepam (RESTORIL) capsule 15 mg  15 mg Oral QHS [name], [name], MD   15 mg at 12/29/22 2146   ziprasidone (GEODON) injection 20 mg  20 mg Intramuscular Q12H PRN [name], [name], MD   20 mg at 07/08/22 1453    Lab Results: No results found for this or any previous visit (from the past 72 hour(s)).  Blood Alcohol level:  Lab Results  Component Value Date   ETH <10 95/62/1308    Metabolic Disorder Labs: Lab Results  Component Value Date   HGBA1C 5.5 04/10/2022   MPG 111.15 04/10/2022   No results found for: "PROLACTIN" Lab Results  Component Value Date   CHOL 167 11/20/2021   TRIG 107 11/20/2021   HDL 26 (L) 11/20/2021   CHOLHDL 6.4 11/20/2021   VLDL 21 11/20/2021   LDLCALC 120 (H) 11/20/2021    Physical Findings: AIMS: Facial and Oral Movements Muscles of Facial Expression: None, normal Lips and Perioral Area: None, normal Jaw: None, normal Tongue: None, normal,Extremity Movements Upper (arms, wrists, hands, fingers): None, normal Lower (legs, knees, ankles, toes): None, normal, Trunk Movements Neck, shoulders, hips: None,  normal, Overall Severity Severity of abnormal movements (highest score from questions above): None, normal Incapacitation due to abnormal movements: None, normal Patient's  awareness of abnormal movements (rate only patient's report): No Awareness, Dental Status Current problems with teeth and/or dentures?: No Does patient usually wear dentures?: No  CIWA:    COWS:     Musculoskeletal: Strength & Muscle Tone: within normal limits Gait & Station: normal Patient leans: N/A  Psychiatric Specialty Exam:  Presentation  General Appearance:  Appropriate for Environment; Casual; Neat; Well Groomed  Eye Contact: Fair  Speech: Slow  Speech Volume: Decreased  Handedness: Left   Mood and Affect  Mood: Anxious  Affect: Congruent   Thought Process  Thought Processes: Goal Directed  Descriptions of Associations:Circumstantial  Orientation:Partial  Thought Content:Scattered  History of Schizophrenia/Schizoaffective disorder:Yes  Duration of Psychotic Symptoms:Greater than six months  Hallucinations:No data recorded Ideas of Reference:None  Suicidal Thoughts:No data recorded Homicidal Thoughts:No data recorded  Sensorium  Memory: Remote Good  Judgment: Fair  Insight: Fair   Art therapist  Concentration: Fair  Attention Span: Fair  Recall: [employer] of Knowledge: Fair  Language: Fair   Psychomotor Activity  Psychomotor Activity:No data recorded  Assets  Assets:No data recorded  Sleep  Sleep:No data recorded   Physical Exam: Physical Exam Vitals and nursing note reviewed.  Constitutional:      Appearance: Normal appearance. He is normal weight.  Neurological:     General: No focal deficit present.     Mental Status: He is alert and oriented to person, place, and time.  Psychiatric:        Mood and Affect: Mood normal.        Behavior: Behavior normal.    Review of Systems  Constitutional: Negative.   HENT: Negative.     Eyes: Negative.   Respiratory: Negative.    Cardiovascular: Negative.   Gastrointestinal: Negative.   Genitourinary: Negative.   Musculoskeletal: Negative.   Skin: Negative.   Neurological: Negative.   Endo/Heme/Allergies: Negative.   Psychiatric/Behavioral: Negative.     Blood pressure (!) 118/95, pulse 81, temperature 98 F (36.7 C), temperature source Oral, resp. rate 18, height 5\' 8"  (1.727 m), weight 85.8 kg, SpO2 99 %. Body mass index is 28.76 kg/m.   Treatment Plan Summary: Daily contact with patient to assess and evaluate symptoms and progress in treatment, Medication management, and Plan continue current medication.  Norleen Xie , DO 12/30/2022, 11:22 AM

## 2022-12-31 LAB — CBC WITH DIFFERENTIAL/PLATELET
Abs Immature Granulocytes: 0.01 10*3/uL (ref 0.00–0.07)
Basophils Absolute: 0.2 10*3/uL — ABNORMAL HIGH (ref 0.0–0.1)
Basophils Relative: 3 %
Eosinophils Absolute: 0 10*3/uL (ref 0.0–0.5)
Eosinophils Relative: 0 %
HCT: 39.6 % (ref 39.0–52.0)
Hemoglobin: 13.5 g/dL (ref 13.0–17.0)
Immature Granulocytes: 0 %
Lymphocytes Relative: 24 %
Lymphs Abs: 1.3 10*3/uL (ref 0.7–4.0)
MCH: 30.6 pg (ref 26.0–34.0)
MCHC: 34.1 g/dL (ref 30.0–36.0)
MCV: 89.8 fL (ref 80.0–100.0)
Monocytes Absolute: 0.5 10*3/uL (ref 0.1–1.0)
Monocytes Relative: 10 %
Neutro Abs: 3.4 10*3/uL (ref 1.7–7.7)
Neutrophils Relative %: 63 %
Platelets: 152 10*3/uL (ref 150–400)
RBC: 4.41 MIL/uL (ref 4.22–5.81)
RDW: 13 % (ref 11.5–15.5)
WBC: 5.4 10*3/uL (ref 4.0–10.5)
nRBC: 0 % (ref 0.0–0.2)

## 2022-12-31 NOTE — Plan of Care (Signed)
Pt with labile mood especially toward bedtime as he refused his medications and was slamming doors and yelling.  Pt was able to communicated that he was upset about not being able to go / leave unit.  Staff processed with pt and he became more calm agreeing to take medications.  Continue 15 min checks.

## 2022-12-31 NOTE — [facility] Group Notes (Signed)
[facility] Group Notes:  (Nursing/MHT/Case Management/Adjunct)  Date:  12/31/2022  Time:  3:09 PM  Type of Therapy:  Psychoeducational Skills  Participation Level:  Did Not Attend   Adam [name] Adam [name] 12/31/2022, 3:09 PM 

## 2022-12-31 NOTE — Plan of Care (Signed)
  Problem: Clinical Measurements: Goal: Will remain free from infection Outcome: Progressing Goal: Respiratory complications will improve Outcome: Progressing Goal: Cardiovascular complication will be avoided Outcome: Progressing   Problem: Activity: Goal: Risk for activity intolerance will decrease Outcome: Progressing   Problem: Nutrition: Goal: Adequate nutrition will be maintained Outcome: Progressing   

## 2022-12-31 NOTE — Progress Notes (Signed)
[facility] MD Progress Note  12/31/2022 10:49 AM Adam [name]  MRN:  [id number] Subjective: Follow-up 62 year old man with schizophrenia.  Patient has no new complaints.  He was irritable this morning because he wanted to use the cell phone.  I had to remind him that his cell phone had been taken from him when he called the police too often. 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[place])   Stroke (HCC)   HTN (hypertension)   HLD (hyperlipidemia)   CAD (coronary artery disease)   Chronic systolic CHF (congestive heart failure) (HCC)   Anxiety   Infection of left earlobe  Total Time spent with patient: 30 minutes  Past Psychiatric History: Past history of schizophrenia  Past Medical History:  Past Medical History:  Diagnosis Date   Myocardial infarction ([place])    Schizophrenia ([place])    Stroke ([place])     Past Surgical History:  Procedure Laterality Date   NO PAST SURGERIES     Family History:  Family History  Problem Relation Age of Onset   Hypertension Mother    Family Psychiatric  History: See previous Social History:  Social History   Substance and Sexual Activity  Alcohol Use Not Currently     Social History   Substance and Sexual Activity  Drug Use Not Currently    Social History   Socioeconomic History   Marital status: Single    Spouse name: Not on file   Number of children: Not on file   Years of education: Not on file   Highest education level: Not on file  Occupational History   Not on file  Tobacco Use   Smoking status: Never    Passive exposure: Never   Smokeless tobacco: Never  Vaping Use   Vaping Use: Unknown  Substance and Sexual Activity   Alcohol use: Not Currently   Drug use: Not Currently   Sexual activity: Not Currently   Other Topics Concern   Not on file  Social History Narrative   Not on file   Social Determinants of Health   Financial Resource Strain: Not on file  Food Insecurity: Not on file  Transportation Needs: Not on file  Physical Activity: Not on file  Stress: Not on file  Social Connections: Not on file   Additional Social History:  Specify valuables returned:  (none)                      Sleep: Fair  Appetite:  Fair  Current Medications: Current Facility-Administered Medications  Medication Dose Route Frequency Provider Last Rate Last Admin   acetaminophen (TYLENOL) tablet 650 mg  650 mg Oral Q6H PRN Ashtan Girtman T, MD   650 mg at 12/31/22 0854   alum & mag hydroxide-simeth (MAALOX/MYLANTA) 200-200-20 MG/5ML suspension 30 mL  30 mL Oral Q4H PRN Giorgia Wahler T, MD   30 mL at 04/26/22 2440   aspirin EC tablet 81 mg  81 mg Oral Daily Ruthetta Koopmann, [name], MD   81 mg at 12/31/22 0854   atorvastatin (LIPITOR) tablet 20 mg  20 mg Oral Daily Arnella Pralle T, MD   20 mg at 12/31/22 0854   atropine 1 % ophthalmic solution 1 drop  1 drop Sublingual QID [name], MD   1 drop at 12/31/22 0903   benztropine (COGENTIN) tablet 0.5 mg  0.5 mg Oral BID Izyan Ezzell T, MD   0.5 mg at 12/31/22 0854   clonazePAM (KLONOPIN) tablet 1 mg  1 mg Oral TID PRN He, [name], MD   1 mg at 12/22/22 2133   cloZAPine (CLOZARIL) tablet 300 mg  300 mg Oral QHS Masiya Claassen T, MD   300 mg at 12/31/22 0030   diphenhydrAMINE (BENADRYL) capsule 50 mg  50 mg Oral Q6H PRN Luvena Wentling T, MD   50 mg at 12/14/22 1709   Or   diphenhydrAMINE (BENADRYL) injection 50 mg  50 mg Intramuscular Q6H PRN Drayden Lukas T, MD       feeding supplement (ENSURE ENLIVE / ENSURE PLUS) liquid 237 mL  237 mL Oral BID BM Forrest Jaroszewski T, MD   237 mL at 12/31/22 0903   gabapentin (NEURONTIN) capsule 300 mg  300 mg Oral TID Bilal Manzer T, MD   300 mg at 12/31/22 1540   haloperidol (HALDOL) tablet 2 mg  2 mg Oral TID Zuzanna Maroney, [name],  MD   2 mg at 12/31/22 0867   haloperidol (HALDOL) tablet 5 mg  5 mg Oral Q6H PRN Solara Goodchild, [name], MD   5 mg at 12/14/22 1709   Or   haloperidol lactate (HALDOL) injection 5 mg  5 mg Intramuscular Q6H PRN Ermin Parisien T, MD       lisinopril (ZESTRIL) tablet 5 mg  5 mg Oral Daily Makeya Hilgert T, MD   5 mg at 12/31/22 0854   magnesium hydroxide (MILK OF MAGNESIA) suspension 30 mL  30 mL Oral Daily PRN Shenique Childers T, MD   30 mL at 12/03/22 1526   metoprolol succinate (TOPROL-XL) 24 hr tablet 25 mg  25 mg Oral Daily Lorne Winkels T, MD   25 mg at 12/31/22 0854   ondansetron (ZOFRAN) tablet 4 mg  4 mg Oral Q8H PRN Lerry Liner M, NP   4 mg at 09/06/22 2320   paliperidone (INVEGA SUSTENNA) injection 156 mg  156 mg Intramuscular Q28 days Earlean Fidalgo T, MD   156 mg at 12/18/22 1654   paliperidone (INVEGA) 24 hr tablet 6 mg  6 mg Oral QHS Laparis Durrett T, MD   6 mg at 12/30/22 0030   senna-docusate (Senokot-S) tablet 2 tablet  2 tablet Oral QHS PRN Sarina Ill, DO   2 tablet at 09/15/22 2110   temazepam (RESTORIL) capsule 15 mg  15 mg Oral QHS Kamill Fulbright T, MD   15 mg at 12/31/22 0030   ziprasidone (GEODON) injection 20 mg  20 mg Intramuscular Q12H PRN Krishawn Vanderweele, [name], MD   20 mg at 07/08/22 1453    Lab Results: No results found for this or any previous visit (from the past 48 hour(s)).  Blood Alcohol level:  Lab Results  Component Value Date   ETH <10 07/20/2021    Metabolic Disorder Labs: Lab Results  Component Value Date   HGBA1C 5.5 04/10/2022   MPG 111.15 04/10/2022   No results found for: "PROLACTIN" Lab Results  Component Value Date   CHOL 167 11/20/2021   TRIG 107 11/20/2021   HDL 26 (L) 11/20/2021   CHOLHDL 6.4 11/20/2021   VLDL 21 11/20/2021   LDLCALC 120 (H) 11/20/2021    Physical Findings: AIMS: Facial and Oral Movements Muscles of Facial Expression: None, normal Lips and Perioral Area: None, normal Jaw: None, normal Tongue: None, normal,Extremity  Movements Upper (arms, wrists, hands, fingers): None, normal Lower (legs, knees, ankles, toes): None,  normal, Trunk Movements Neck, shoulders, hips: None, normal, Overall Severity Severity of abnormal movements (highest score from questions above): None, normal Incapacitation due to abnormal movements: None, normal Patient's awareness of abnormal movements (rate only patient's report): No Awareness, Dental Status Current problems with teeth and/or dentures?: No Does patient usually wear dentures?: No  CIWA:    COWS:     Musculoskeletal: Strength & Muscle Tone: within normal limits Gait & Station: normal Patient leans: N/A  Psychiatric Specialty Exam:  Presentation  General Appearance:  Appropriate for Environment; Casual; Neat; Well Groomed  Eye Contact: Fair  Speech: Slow  Speech Volume: Decreased  Handedness: Left   Mood and Affect  Mood: Anxious  Affect: Congruent   Thought Process  Thought Processes: Goal Directed  Descriptions of Associations:Circumstantial  Orientation:Partial  Thought Content:Scattered  History of Schizophrenia/Schizoaffective disorder:Yes  Duration of Psychotic Symptoms:Greater than six months  Hallucinations:No data recorded Ideas of Reference:None  Suicidal Thoughts:No data recorded Homicidal Thoughts:No data recorded  Sensorium  Memory: Remote Good  Judgment: Fair  Insight: Fair   Community education officer  Concentration: Fair  Attention Span: Fair  Recall: [employer] of Knowledge: Fair  Language: Fair   Psychomotor Activity  Psychomotor Activity:No data recorded  Assets  Assets:No data recorded  Sleep  Sleep:No data recorded   Physical Exam: Physical Exam Vitals and nursing note reviewed.  Constitutional:      Appearance: Normal appearance.  HENT:     Head: Normocephalic and atraumatic.     Mouth/Throat:     Pharynx: Oropharynx is clear.  Eyes:     Pupils: Pupils are equal, round,  and reactive to light.  Cardiovascular:     Rate and Rhythm: Normal rate and regular rhythm.  Pulmonary:     Effort: Pulmonary effort is normal.     Breath sounds: Normal breath sounds.  Abdominal:     General: Abdomen is flat.     Palpations: Abdomen is soft.  Musculoskeletal:        General: Normal range of motion.  Skin:    General: Skin is warm and dry.  Neurological:     General: No focal deficit present.     Mental Status: He is alert. Mental status is at baseline.  Psychiatric:        Attention and Perception: He is inattentive.        Mood and Affect: Affect is blunt.        Speech: He is noncommunicative.    Review of Systems  Unable to perform ROS: Language   Blood pressure 125/86, pulse 100, temperature 98 F (36.7 C), temperature source Oral, resp. rate 18, height 5\' 8"  (1.727 m), weight 85.8 kg, SpO2 99 %. Body mass index is 28.76 kg/m.   Treatment Plan Summary: Medication management and Plan no change to medication.  Supportive encouragement.  Plan continues to be focused on discharge.  Alethia Berthold, MD 12/31/2022, 10:49 AM

## 2022-12-31 NOTE — Progress Notes (Signed)
Pt denies SI/HI/AVH and verbally agrees to approach staff if these become apparent or before harming themselves/others. Rates pain on the face scale 6. Pt started to become fixated on a phone/tablet and was yelling "android" and saying "phone" over and over. Pt went to room and sounded as if he was crying. Pt continued to yell and bang on the nurses station window trying to get staff attention to give him a phone. Pt willingly took PRN PO and calmed down after. Pt has been calm and cooperative since. Pt had not eaten breakfast or lunch but did eat dinner. Pt has been pacing on the unit throughout the day. Scheduled medications administered to pt, per MD orders. RN provided support and encouragement to pt. Q15 min safety checks implemented and continued. Pt safe on the unit. RN will continue to monitor and intervene as needed.  12/31/22 0854  Psych Admission Type (Psych Patients Only)  Admission Status Involuntary  Psychosocial Assessment  Patient Complaints Anxiety  Eye Contact Fair  Facial Expression Animated;Anxious;Sad  Affect Anxious;Angry;Labile;Sad  Speech Aphasic;Loud  Interaction Childlike  Motor Activity Restless  Appearance/Hygiene Disheveled  Behavior Characteristics Cooperative;Appropriate to situation;Anxious  Mood Anxious;Depressed;Preoccupied  Aggressive Behavior  Effect No apparent injury  Thought Process  Coherency WDL  Content Preoccupation  Delusions None reported or observed  Perception WDL  Hallucination None reported or observed  Judgment Limited  Confusion Moderate  Danger to Self  Current suicidal ideation? Denies  Danger to Others  Danger to Others None reported or observed  Danger to Others Abnormal  Harmful Behavior to others No threats or harm toward other people  Destructive Behavior No threats or harm toward property

## 2022-12-31 NOTE — Group Note (Signed)
[facility] LCSW Group Therapy Note    Group Date: 12/31/2022 Start Time: 1300 End Time: 1400  Type of Therapy and Topic:  Group Therapy:  Overcoming Obstacles  Participation Level:  [facility] PARTICIPATION LEVEL: None  Mood: Euthymic   Description of Group:   In this group patients will be encouraged to explore what they see as obstacles to their own wellness and recovery. They will be guided to discuss their thoughts, feelings, and behaviors related to these obstacles. The group will process together ways to cope with barriers, with attention given to specific choices patients can make. Each patient will be challenged to identify changes they are motivated to make in order to overcome their obstacles. This group will be process-oriented, with patients participating in exploration of their own experiences as well as giving and receiving support and challenge from other group members.  Therapeutic Goals: 1. Patient will identify personal and current obstacles as they relate to admission. 2. Patient will identify barriers that currently interfere with their wellness or overcoming obstacles.  3. Patient will identify feelings, thought process and behaviors related to these barriers. 4. 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    Therapeutic Modalities:   Cognitive Behavioral Therapy Solution Focused Therapy Motivational Interviewing Relapse Prevention Therapy   Adam [name], [place]

## 2023-01-01 MED ORDER — HALOPERIDOL 5 MG PO TABS
5.0000 mg | ORAL_TABLET | ORAL | Status: AC
  Administered 2023-01-01: 5 mg via ORAL
  Filled 2023-01-01 (×2): qty 1

## 2023-01-01 MED ORDER — HALOPERIDOL LACTATE 5 MG/ML IJ SOLN: 5.0000 mg | INTRAMUSCULAR | Status: AC

## 2023-01-01 NOTE — Plan of Care (Signed)
  Problem: Education: Goal: Ability to state activities that reduce stress will improve Outcome: Not Progressing   Problem: Coping: Goal: Ability to identify and develop effective coping behavior will improve Outcome: Not Progressing   Problem: Self-Concept: Goal: Ability to identify factors that promote anxiety will improve Outcome: Not Progressing Goal: Level of anxiety will decrease Outcome: Not Progressing Goal: Ability to modify response to factors that promote anxiety will improve Outcome: Not Progressing   Problem: Education: Goal: Knowledge of General Education information will improve Description: Including pain rating scale, medication(s)/side effects and non-pharmacologic comfort measures Outcome: Not Progressing   Problem: Health Behavior/Discharge Planning: Goal: Ability to manage health-related needs will improve Outcome: Not Progressing   Problem: Clinical Measurements: Goal: Ability to maintain clinical measurements within normal limits will improve Outcome: Not Progressing Goal: Will remain free from infection Outcome: Not Progressing Goal: Diagnostic test results will improve Outcome: Not Progressing Goal: Respiratory complications will improve Outcome: Not Progressing Goal: Cardiovascular complication will be avoided Outcome: Not Progressing   Problem: Activity: Goal: Risk for activity intolerance will decrease Outcome: Not Progressing   Problem: Nutrition: Goal: Adequate nutrition will be maintained Outcome: Not Progressing   Problem: Coping: Goal: Level of anxiety will decrease Outcome: Not Progressing   Problem: Elimination: Goal: Will not experience complications related to bowel motility Outcome: Not Progressing Goal: Will not experience complications related to urinary retention Outcome: Not Progressing   Problem: Pain Managment: Goal: General experience of comfort will improve Outcome: Not Progressing   Problem: Safety: Goal:  Ability to remain free from injury will improve Outcome: Not Progressing   Problem: Skin Integrity: Goal: Risk for impaired skin integrity will decrease Outcome: Not Progressing   Problem: Activity: Goal: Interest or engagement in leisure activities will improve Outcome: Not Progressing   Problem: Coping: Goal: Coping ability will improve Outcome: Not Progressing   Problem: Education: Goal: Will be free of psychotic symptoms Outcome: Not Progressing   

## 2023-01-01 NOTE — Progress Notes (Signed)
[facility] MD Progress Note  01/01/2023 1:00 PM Adam [name]  MRN:  [id number] Subjective: Patient seen.  Once again he is in a bit of a mood today.  Irritated because he no longer has access to his cell phone.  Not violent but knocking on my door multiple times having trouble taking no for an answer. 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[place])   Stroke (HCC)   HTN (hypertension)   HLD (hyperlipidemia)   CAD (coronary artery disease)   Chronic systolic CHF (congestive heart failure) (HCC)   Anxiety   Infection of left earlobe  Total Time spent with patient: 30 minutes  Past Psychiatric History: Past history of schizophrenia  Past Medical History:  Past Medical History:  Diagnosis Date   Myocardial infarction ([place])    Schizophrenia ([place])    Stroke ([place])     Past Surgical History:  Procedure Laterality Date   NO PAST SURGERIES     Family History:  Family History  Problem Relation Age of Onset   Hypertension Mother    Family Psychiatric  History: See previous Social History:  Social History   Substance and Sexual Activity  Alcohol Use Not Currently     Social History   Substance and Sexual Activity  Drug Use Not Currently    Social History   Socioeconomic History   Marital status: Single    Spouse name: Not on file   Number of children: Not on file   Years of education: Not on file   Highest education level: Not on file  Occupational History   Not on file  Tobacco Use   Smoking status: Never    Passive exposure: Never   Smokeless tobacco: Never  Vaping Use   Vaping Use: Unknown  Substance and Sexual Activity   Alcohol use: Not Currently   Drug use: Not Currently   Sexual activity: Not Currently  Other Topics Concern   Not on file  Social  History Narrative   Not on file   Social Determinants of Health   Financial Resource Strain: Not on file  Food Insecurity: Not on file  Transportation Needs: Not on file  Physical Activity: Not on file  Stress: Not on file  Social Connections: Not on file   Additional Social History:  Specify valuables returned:  (none)                      Sleep: Fair  Appetite:  Fair  Current Medications: Current Facility-Administered Medications  Medication Dose Route Frequency Provider Last Rate Last Admin   acetaminophen (TYLENOL) tablet 650 mg  650 mg Oral Q6H PRN Giavanni Zeitlin T, MD   650 mg at 12/31/22 0854   alum & mag hydroxide-simeth (MAALOX/MYLANTA) 200-200-20 MG/5ML suspension 30 mL  30 mL Oral Q4H PRN Aziz Slape T, MD   30 mL at 04/26/22 8527   aspirin EC tablet 81 mg  81 mg Oral Daily Sarafina Puthoff T, MD   81 mg at 01/01/23 0925   atorvastatin (LIPITOR) tablet 20 mg  20 mg Oral Daily Stephine Langbehn T, MD   20 mg at 01/01/23 0926   atropine 1 % ophthalmic solution 1 drop  1 drop Sublingual QID [name], MD   1 drop at 01/01/23 1120   benztropine (COGENTIN) tablet 0.5 mg  0.5 mg Oral  BID Foster Sonnier, [name], MD   0.5 mg at 01/01/23 7106   clonazePAM (KLONOPIN) tablet 1 mg  1 mg Oral TID PRN He, [name], MD   1 mg at 12/31/22 1414   cloZAPine (CLOZARIL) tablet 300 mg  300 mg Oral QHS Jenise Iannelli T, MD   300 mg at 12/31/22 2134   diphenhydrAMINE (BENADRYL) capsule 50 mg  50 mg Oral Q6H PRN Jae Skeet, [name], MD   50 mg at 12/31/22 1414   Or   diphenhydrAMINE (BENADRYL) injection 50 mg  50 mg Intramuscular Q6H PRN Zofia Peckinpaugh, [name], MD       feeding supplement (ENSURE ENLIVE / ENSURE PLUS) liquid 237 mL  237 mL Oral BID BM Marthann Abshier T, MD   237 mL at 01/01/23 1231   gabapentin (NEURONTIN) capsule 300 mg  300 mg Oral TID Maizey Menendez T, MD   300 mg at 01/01/23 1000   haloperidol (HALDOL) tablet 2 mg  2 mg Oral TID Shoshana Johal, [name], MD   2 mg at 01/01/23 1121   haloperidol  (HALDOL) tablet 5 mg  5 mg Oral Q6H PRN Kavya Haag, [name], MD   5 mg at 12/14/22 1709   Or   haloperidol lactate (HALDOL) injection 5 mg  5 mg Intramuscular Q6H PRN Morgan Rennert T, MD       haloperidol (HALDOL) tablet 5 mg  5 mg Oral NOW Rohin Krejci T, MD       Or   haloperidol lactate (HALDOL) injection 5 mg  5 mg Intramuscular NOW Isabellarose Kope T, MD       lisinopril (ZESTRIL) tablet 5 mg  5 mg Oral Daily Ravis Herne T, MD   5 mg at 01/01/23 1121   magnesium hydroxide (MILK OF MAGNESIA) suspension 30 mL  30 mL Oral Daily PRN Reyonna Haack T, MD   30 mL at 12/03/22 1526   metoprolol succinate (TOPROL-XL) 24 hr tablet 25 mg  25 mg Oral Daily Sativa Gelles T, MD   25 mg at 01/01/23 0924   ondansetron (ZOFRAN) tablet 4 mg  4 mg Oral Q8H PRN Deloria Lair, NP   4 mg at 09/06/22 2320   paliperidone (INVEGA SUSTENNA) injection 156 mg  156 mg Intramuscular Q28 days Nemiah Bubar, [name], MD   156 mg at 12/18/22 1654   paliperidone (INVEGA) 24 hr tablet 6 mg  6 mg Oral QHS Keylor Rands T, MD   6 mg at 12/31/22 2137   senna-docusate (Senokot-S) tablet 2 tablet  2 tablet Oral QHS PRN [name], DO   2 tablet at 09/15/22 2110   temazepam (RESTORIL) capsule 15 mg  15 mg Oral QHS Kya Mayfield,  T, MD   15 mg at 12/31/22 2135   ziprasidone (GEODON) injection 20 mg  20 mg Intramuscular Q12H PRN , [name], MD   20 mg at 07/08/22 1453    Lab Results:  Results for orders placed or performed during the hospital encounter of 03/19/22 (from the past 48 hour(s))  CBC with Differential/Platelet     Status: Abnormal   Collection Time: 12/31/22  9:12 PM  Result Value Ref Range   WBC 5.4 4.0 - 10.5 K/uL   RBC 4.41 4.22 - 5.81 MIL/uL   Hemoglobin 13.5 13.0 - 17.0 g/dL   HCT 39.6 39.0 - 52.0 %   MCV 89.8 80.0 - 100.0 fL   MCH 30.6 26.0 - 34.0 pg   MCHC 34.1 30.0 - 36.0 g/dL  RDW 13.0 11.5 - 15.5 %   Platelets 152 150 - 400 K/uL   nRBC 0.0 0.0 - 0.2 %   Neutrophils Relative % 63 %   Neutro  Abs 3.4 1.7 - 7.7 K/uL   Lymphocytes Relative 24 %   Lymphs Abs 1.3 0.7 - 4.0 K/uL   Monocytes Relative 10 %   Monocytes Absolute 0.5 0.1 - 1.0 K/uL   Eosinophils Relative 0 %   Eosinophils Absolute 0.0 0.0 - 0.5 K/uL   Basophils Relative 3 %   Basophils Absolute 0.2 (H) 0.0 - 0.1 K/uL   Immature Granulocytes 0 %   Abs Immature Granulocytes 0.01 0.00 - 0.07 K/uL    Comment: Performed at [facility], [address] [place], [place] [address]    Blood Alcohol level:  Lab Results  Component Value Date   [facility] <10 71/05/2693    Metabolic Disorder Labs: Lab Results  Component Value Date   HGBA1C 5.5 04/10/2022   MPG 111.15 04/10/2022   No results found for: "PROLACTIN" Lab Results  Component Value Date   CHOL 167 11/20/2021   TRIG 107 11/20/2021   HDL 26 (L) 11/20/2021   CHOLHDL 6.4 11/20/2021   VLDL 21 11/20/2021   LDLCALC 120 (H) 11/20/2021    Physical Findings: AIMS: Facial and Oral Movements Muscles of Facial Expression: None, normal Lips and Perioral Area: None, normal Jaw: None, normal Tongue: None, normal,Extremity Movements Upper (arms, wrists, hands, fingers): None, normal Lower (legs, knees, ankles, toes): None, normal, Trunk Movements Neck, shoulders, hips: None, normal, Overall Severity Severity of abnormal movements (highest score from questions above): None, normal Incapacitation due to abnormal movements: None, normal Patient's awareness of abnormal movements (rate only patient's report): No Awareness, Dental Status Current problems with teeth and/or dentures?: No Does patient usually wear dentures?: No  CIWA:    COWS:     Musculoskeletal: Strength & Muscle Tone: within normal limits Gait & Station: normal Patient leans: N/A  Psychiatric Specialty Exam:  Presentation  General Appearance:  Appropriate for Environment; Casual; Neat; Well Groomed  Eye Contact: Fair  Speech: Slow  Speech  Volume: Decreased  Handedness: Left   Mood and Affect  Mood: Anxious  Affect: Congruent   Thought Process  Thought Processes: Goal Directed  Descriptions of Associations:Circumstantial  Orientation:Partial  Thought Content:Scattered  History of Schizophrenia/Schizoaffective disorder:Yes  Duration of Psychotic Symptoms:Greater than six months  Hallucinations:No data recorded Ideas of Reference:None  Suicidal Thoughts:No data recorded Homicidal Thoughts:No data recorded  Sensorium  Memory: Remote Good  Judgment: Fair  Insight: Fair   Community education officer  Concentration: Fair  Attention Span: Fair  Recall: [employer] of Knowledge: Fair  Language: Fair   Psychomotor Activity  Psychomotor Activity:No data recorded  Assets  Assets:No data recorded  Sleep  Sleep:No data recorded   Physical Exam: Physical Exam Vitals and nursing note reviewed.  Constitutional:      Appearance: Normal appearance.  HENT:     Head: Normocephalic and atraumatic.     Mouth/Throat:     Pharynx: Oropharynx is clear.  Eyes:     Pupils: Pupils are equal, round, and reactive to light.  Cardiovascular:     Rate and Rhythm: Normal rate and regular rhythm.  Pulmonary:     Effort: Pulmonary effort is normal.     Breath sounds: Normal breath sounds.  Abdominal:     General: Abdomen is flat.     Palpations: Abdomen is soft.  Musculoskeletal:  General: Normal range of motion.  Skin:    General: Skin is warm and dry.  Neurological:     General: No focal deficit present.     Mental Status: He is alert. Mental status is at baseline.  Psychiatric:        Mood and Affect: Mood normal.        Thought Content: Thought content normal.    Review of Systems  Unable to perform ROS: Language   Blood pressure 125/86, pulse 100, temperature 98 F (36.7 C), temperature source Oral, resp. rate 18, height 5\' 8"  (1.727 m), weight 85.8 kg, SpO2 99 %. Body mass  index is 28.76 kg/m.   Treatment Plan Summary: Medication management and Plan unfortunate man who continues to be kept in a hospital with hospital level restrictions despite being completely ready for discharge.  Apologized to him for the situation once again.  Had them give him a little extra anxiety medicine today.  Follow up regularly mainly for discharge planning  , MD 01/01/2023, 1:00 PM

## 2023-01-01 NOTE — [facility] Group Notes (Signed)
[facility] Group Notes:  (Nursing/MHT/Case Management/Adjunct)  Date:  01/01/2023  Time:  3:33 PM  Type of Therapy:  Psychoeducational Skills  Participation Level:  Did Not Attend   Adela Lank [facility] 01/01/2023, 3:33 PM

## 2023-01-01 NOTE — [facility] Group Notes (Signed)
[facility] Group Notes:  (Nursing/MHT/Case Management/Adjunct)  Date:  01/01/2023  Time:  11:06 AM  Type of Therapy:   community meeting  Participation Level:  Did Not Attend    Antonieta Pert 01/01/2023, 11:06 AM

## 2023-01-01 NOTE — Group Note (Signed)
[facility] LCSW Group Therapy Note   Group Date: 01/01/2023 Start Time: 1300 End Time: 1400  Type of Therapy/Topic:  Group Therapy:  Feelings about Diagnosis  Participation Level:  Minimal    Description of Group:    This group will allow patients to explore their thoughts and feelings about diagnoses they have received. Patients will be guided to explore their level of understanding and acceptance of these diagnoses. Facilitator will encourage patients to process their thoughts and feelings about the reactions of others to their diagnosis, and will guide patients in identifying ways to discuss their diagnosis with significant others in their lives. This group will be process-oriented, with patients participating in exploration of their own experiences as well as giving and receiving support and challenge from other group members.   Therapeutic Goals: 1. Patient will demonstrate understanding of diagnosis as evidence by identifying two or more symptoms of the disorder:  2. Patient will be able to express two feelings regarding the diagnosis 3. Patient will demonstrate ability to communicate their needs through discussion and/or role plays  Summary of Patient Progress: Patient was present for the majority of the group process. His contribution to today's discussion was "Charmed" which he yelled out twice when others were not talking.   Therapeutic Modalities:   Cognitive Behavioral Therapy Brief Therapy Feelings Identification    Shirl Harris, LCSW

## 2023-01-01 NOTE — Progress Notes (Signed)
Pt became more irritable as the night has gone on. Pt preoccupied yelling, "android:, over and over. Pt redirected to his room but continues to yell. Pt came up to the nurses station banging on the door and then walked down the hall and was banging on the exit door. Pt went back to his room and PRN medication administered per MD orders. Pt took the shots willingly but because agitated after, continuing to yell. Pt is in his room and remains safe at this time. Check MAR

## 2023-01-01 NOTE — Progress Notes (Signed)
Pharmacy Consult - Clozapine     62 yo male ordered clozapine 300 mg PO daily  This patient's order has been reviewed for prescribing contraindications.    Clozapine REMS enrollment Verified: yes on 1/7 REMS patient ID: [id number] Current Outpatient Monitoring: Weekly    Home Regimen:  300 mg PO QHS    Dose Adjustments This Admission: N/A   Labs: Date    [place]    Submitted? 1/7 2600 Yes 1/14 3900 Yes 1/21 3600 Yes 1/29 3400 Yes    Plan: RA: [id number] Continue Clozapine 300mg  daily  Monitor ANC at least weekly while inpatient   Aubery Lapping, PharmD

## 2023-01-01 NOTE — Progress Notes (Signed)
Pt. Visible on unit although minimally interactive with peers. Continues to request use of telephone. Affect is labile.  Unable to tolerate discussion regarding education or plan of care. Cooperative with medication administration. Denied somatic complaints. To bed within normal limits. Continue to monitor.

## 2023-01-01 NOTE — Progress Notes (Signed)
D- Patient alert and oriented. Affect aggravated/mood agitated most of the shift repeating "Android, Android, Android". Referring to an Adroid phone. Denies SI, HI, AVH or noticed. Denies pain.  A- Scheduled medications administered to patient, per MD orders. Patient settled down during dinner time after afternoon medications. Support and encouragement provided.  Routine safety checks conducted every 15 minutes.  Patient informed to notify staff with problems or concerns. R- No adverse drug reactions noted. Patient contracts for safety at this time. Patient compliant with medications and treatment plan. Patient receptive and calm at present. Patient contracts for safety and remains safe on the unit at this time.

## 2023-01-01 NOTE — Progress Notes (Addendum)
Patient calm and pleasant during assessment. Pt denies SI/HI/AVH. Pt observed interacting appropriately with staff and peers on the unit. Pt compliant with medication administration per MD orders. Pt refused his scheduled medication tonight, pt give education, still refused. Pt being monitored Q 15 minutes for safety per unit protocol, remains safe on the unit    

## 2023-01-02 NOTE — NC FL2 (Signed)
[place] LEVEL OF CARE FORM     IDENTIFICATION  Patient Name: Adam [name] Birthdate: 09-08-1961 Sex: male Admission Date (Current Location): 03/19/2022  [facility] and [place] Number:  Engineering geologist and Address:  [facility], [address], [place], [place] [address]      Provider Number: [id number]  Attending Physician Name and Address:  Gonzella Lex, MD  Relative Name and Phone Number:  [place] Givan, mother/POA, [phone]    Current Level of Care: Hospital Recommended Level of Care: [place], [facility], Other (Comment) (Group Home) Prior Approval Number:    Date Approved/Denied:   PASRR Number:    Discharge Plan: Other (Comment) (Residential)    Current Diagnoses: Patient Active Problem List   Diagnosis Date Noted   Infection of left earlobe 10/14/2022   HTN (hypertension) 10/05/2022   HLD (hyperlipidemia) 10/05/2022   CAD (coronary artery disease) 14/43/1540   Chronic systolic CHF (congestive heart failure) ([place]) 10/05/2022   Anxiety 10/05/2022   Stroke ([place]) 11/27/2021   NSTEMI (non-ST elevated myocardial infarction) ([place]) 11/21/2021   DNR (do not resuscitate)/DNI(Do Not Intubate) 11/20/2021   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   Schizophrenia, chronic condition with acute exacerbation ([place]) 07/20/2021    Orientation RESPIRATION BLADDER Height & Weight     Self, Time, Situation, Place  Normal Continent Weight: 85.8 kg Height:  5\' 8"  (172.7 cm)  BEHAVIORAL SYMPTOMS/MOOD NEUROLOGICAL BOWEL NUTRITION STATUS  Other (Comment) (Cooperative)  (WNL) Continent Diet  AMBULATORY STATUS COMMUNICATION OF NEEDS Skin   Independent Non-Verbally Normal                       Personal Care Assistance Level of Assistance   (none) Bathing Assistance: Independent Feeding assistance: Independent Dressing Assistance: Independent Total Care  Assistance: Independent   Functional Limitations Info  Speech Sight Info: Adequate Hearing Info: Adequate Speech Info: Impaired (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)    SPECIAL CARE FACTORS FREQUENCY   (NA)                    Contractures Contractures Info: Not present    Additional Factors Info  Allergies, Code Status Code Status Info: Full Code Allergies Info: Morphine, NSAIDS Psychotropic Info: Haldol, Clozaril, Restoril       Allergies  Allergen Reactions   Morphine And Related    Nsaids       Current Medications (01/02/2023):  This is the current hospital active medication list Current Facility-Administered Medications  Medication Dose Route Frequency Provider Last Rate Last Admin   acetaminophen (TYLENOL) tablet 650 mg  650 mg Oral Q6H PRN [name], [name], MD   650 mg at 12/31/22 0854   alum & mag hydroxide-simeth (MAALOX/MYLANTA) 200-200-20 MG/5ML suspension 30 mL  30 mL Oral Q4H PRN [name], [name], MD   30 mL at 04/26/22 0867   aspirin EC tablet 81 mg  81 mg Oral Daily [name], [name], MD   81 mg at 01/02/23 0842   atorvastatin (LIPITOR) tablet 20 mg  20 mg Oral Daily [name], [name], MD   20 mg at 01/02/23 0841   atropine 1 % ophthalmic solution 1 drop  1 drop Sublingual QID [name], MD   1 drop at 01/02/23 1226   benztropine (COGENTIN) tablet 0.5 mg  0.5 mg Oral BID [name], [name], MD   0.5 mg at 01/02/23 0841   clonazePAM (  KLONOPIN) tablet 1 mg  1 mg Oral TID PRN He, [name], MD   1 mg at 12/31/22 1414   cloZAPine (CLOZARIL) tablet 300 mg  300 mg Oral QHS [name], [name], MD   300 mg at 12/31/22 2134   diphenhydrAMINE (BENADRYL) capsule 50 mg  50 mg Oral Q6H PRN [name], [name], MD   50 mg at 12/31/22 1414   Or   diphenhydrAMINE (BENADRYL) injection 50 mg  50 mg Intramuscular Q6H PRN [name], [name], MD   50 mg at 01/01/23 2252   feeding supplement (ENSURE ENLIVE / ENSURE PLUS) liquid 237 mL  237 mL Oral BID BM [name], [name], MD   237 mL at  01/02/23 1055   gabapentin (NEURONTIN) capsule 300 mg  300 mg Oral TID [name], [name], MD   300 mg at 01/02/23 0900   haloperidol (HALDOL) tablet 2 mg  2 mg Oral TID [name], [name], MD   2 mg at 01/02/23 0900   haloperidol (HALDOL) tablet 5 mg  5 mg Oral Q6H PRN [name], [name], MD   5 mg at 12/14/22 1709   Or   haloperidol lactate (HALDOL) injection 5 mg  5 mg Intramuscular Q6H PRN [name], [name], MD   5 mg at 01/01/23 2252   lisinopril (ZESTRIL) tablet 5 mg  5 mg Oral Daily [name], [name], MD   5 mg at 01/02/23 1610   magnesium hydroxide (MILK OF MAGNESIA) suspension 30 mL  30 mL Oral Daily PRN [name], [name], MD   30 mL at 12/03/22 1526   metoprolol succinate (TOPROL-XL) 24 hr tablet 25 mg  25 mg Oral Daily [name], [name], MD   25 mg at 01/02/23 0841   ondansetron (ZOFRAN) tablet 4 mg  4 mg Oral Q8H PRN [name], NP   4 mg at 09/06/22 2320   paliperidone (INVEGA SUSTENNA) injection 156 mg  156 mg Intramuscular Q28 days [name], [name], MD   156 mg at 12/18/22 1654   paliperidone (INVEGA) 24 hr tablet 6 mg  6 mg Oral QHS [name], [name], MD   6 mg at 12/31/22 2137   senna-docusate (Senokot-S) tablet 2 tablet  2 tablet Oral QHS PRN [name], DO   2 tablet at 09/15/22 2110   temazepam (RESTORIL) capsule 15 mg  15 mg Oral QHS [name], [name], MD   15 mg at 12/31/22 2135   ziprasidone (GEODON) injection 20 mg  20 mg Intramuscular Q12H PRN [name], [name], MD   20 mg at 07/08/22 1453     Discharge Medications: Please see discharge summary for a list of discharge medications.  Relevant Imaging Results:  Relevant Lab Results:   Additional Information SS#: [id number]  [name], [place]

## 2023-01-02 NOTE — Progress Notes (Addendum)
BHH/BMU LCSW Progress Note   01/02/2023    2:53 PM  Imtiaz Isaksen   [id number]   Type of Contact and Topic:  Bed Placement   Comprehensive summary of efforts in pursuit of placement.   Patient has been interviewed by the following facilities; results annotated respectively:  Justina, w/ [facility] (patient denied) Cris, w/ [facility] (patient denied)  [facility]am[facility] Delia [facility], [facility] (no longer interested in patient)  Tim and Lezlie Octave, owners of [employer], (patient denied)  [facility], group home owner (denied patient due to Stroke dx).  [address] Ainsley Spinner, [phone], (denied due to bed availability)    The following facilities have denied the patient and have no interested in interviewing patient or reviewing documentation any further:  [facility], perferred SNF (denied via [place])  [facility], preferred ALF (denied via [place])  [facility], [place], [phone] (denied, no bed availability).  [place], Marlou Starks, [phone] (denied due to Stroke dx) [facility], Birdena Jubilee (denied, does not meet demographic criteria)  [facility], Johney Maine, [phone] (denied due to TBI dx)  [facility] [facility], [place], [phone] (denied due to age) [facility], Margart Sickles, [phone] (denied due to stroke) [address] Colleen Can, [phone] (denied due to no bed availability)  [facility], [phone] (denied due to no bed availability)  [facility], [facility] Cancer, [phone] (denied due to dx of Stroke)  [place], [place], [phone] (denied due to aggression)   The following facilities, as listed by [facility] as "Supervised Living for Adults with Mental Illness," did not respond to CSW's inquiry to consider patient for residential care:  [facility], [facility] [place] [facility], [place],  [facility], Darlene Moore [place] [facility]. [facility], [facility], [facility], [place], [facility], [facility], [phone]  [facility], [place], [facility], [place], Terance Hart  The following facilities are reviewing patient's referral, CSW to follow up (see separate note): [facility], Chase Caller, [phone], faxed to [place], [place], [phone], Referral sent to [email or web address]  Signed:  [name], MSW, LCSW, LCAS 01/02/2023 2:53 PM

## 2023-01-02 NOTE — Progress Notes (Signed)
Patient calm and pleasant during assessment. Pt denies SI/HI/AVH. Pt observed interacting appropriately with staff and peers on the unit. Pt compliant with medication administration per MD orders. Pt refused his scheduled medication tonight, pt give education, still refused. Pt being monitored Q 15 minutes for safety per unit protocol, remains safe on the unit

## 2023-01-02 NOTE — Progress Notes (Signed)
Recreation Therapy   01/02/2023         Time: 9:45am      Group Topic/Focus: Stress Management   Participation Level: Minimal    Adam [name] Baldomero Mirarchi LRT/CTRS  01/02/2023 11:59 AM

## 2023-01-02 NOTE — BHH Group Notes (Signed)
[facility] Group Notes:  (Nursing/MHT/Case Management/Adjunct)  Date:  01/02/2023  Time:  8:32 PM  Type of Therapy:   Wrap up  Participation Level:  Did Not Attend    Summary of Progress/Problems:  Adam [name] 01/02/2023, 8:32 PM 

## 2023-01-02 NOTE — Plan of Care (Signed)
D- Patient alert and oriented to person, place, and situation. Patient is childlike and due to a past stroke, he has expressive aphasia. Patient presented in a pleasant mood on assessment, fixated on him being [place] today, and had no complaints to voice to this Probation officer. Patient reported that he slept well last night, and was pointing to the picture of the Invega injection, wondering when he will receive his next shot. Patient denied SI, HI, AVH and pain at this time. Patient also denied any signs/symptoms of depression and anxiety, stating "no", and shaking his head no, laughing. Patient had no stated goals for today.   A- Scheduled medications administered to patient, per MD orders. Support and encouragement provided.  Routine safety checks conducted every 15 minutes.  Patient informed to notify staff with problems or concerns.  R- No adverse drug reactions noted. Patient contracts for safety at this time. Patient compliant with medications and treatment plan. Patient receptive, calm, and cooperative. Patient interacts well with others on the unit. Patient remains safe at this time.  Problem: Education: Goal: Ability to state activities that reduce stress will improve Outcome: Progressing   Problem: Coping: Goal: Ability to identify and develop effective coping behavior will improve Outcome: Progressing   Problem: Self-Concept: Goal: Ability to identify factors that promote anxiety will improve Outcome: Progressing Goal: Level of anxiety will decrease Outcome: Progressing Goal: Ability to modify response to factors that promote anxiety will improve Outcome: Progressing   Problem: Education: Goal: Knowledge of General Education information will improve Description: Including pain rating scale, medication(s)/side effects and non-pharmacologic comfort measures Outcome: Progressing   Problem: Health Behavior/Discharge Planning: Goal: Ability to manage health-related needs will  improve Outcome: Progressing   Problem: Clinical Measurements: Goal: Ability to maintain clinical measurements within normal limits will improve Outcome: Progressing Goal: Will remain free from infection Outcome: Progressing Goal: Diagnostic test results will improve Outcome: Progressing Goal: Respiratory complications will improve Outcome: Progressing Goal: Cardiovascular complication will be avoided Outcome: Progressing   Problem: Activity: Goal: Risk for activity intolerance will decrease Outcome: Progressing   Problem: Nutrition: Goal: Adequate nutrition will be maintained Outcome: Progressing   Problem: Coping: Goal: Level of anxiety will decrease Outcome: Progressing   Problem: Elimination: Goal: Will not experience complications related to bowel motility Outcome: Progressing Goal: Will not experience complications related to urinary retention Outcome: Progressing   Problem: Pain Managment: Goal: General experience of comfort will improve Outcome: Progressing   Problem: Safety: Goal: Ability to remain free from injury will improve Outcome: Progressing   Problem: Skin Integrity: Goal: Risk for impaired skin integrity will decrease Outcome: Progressing   Problem: Activity: Goal: Interest or engagement in leisure activities will improve Outcome: Progressing   Problem: Coping: Goal: Coping ability will improve Outcome: Progressing   Problem: Education: Goal: Will be free of psychotic symptoms Outcome: Progressing

## 2023-01-02 NOTE — BHH Counselor (Signed)
CSW team Jacklyn Shell and Probation officer) met with [employer] staff Delle Reining., Moishe Spice., [place] D., and Penny Pia.) and [facility] leadership Baxter Hire., Velva Harman., Gypsy Balsam and Craige Cotta.) for weekly meeting regarding case progression.   Notes from last week reviewed by [facility]. Team was updated that attorneys from [place] had met and were in agreement that the language within the [place] is not helpful. [facility] staff to meet with their medical staff this afternoon to discuss pt's case.   Michaela provided group with update regarding interview with [facility]. Interview went well but they were not interested in pt after hearing that he has a TBI history.    Zack updated group that neurocognitive facilities has stated that they only take pt's who had no other options for placement and therefore pt would have to wait. Group discussed whether this needed to be revisited as options for placement keep turning the pt down and this would be an appropriate placement for him.   Lysbeth Galas brought up question of whether pt could be better placed by medical floor as pt's from psych, historically, do not get placed at ALF due to their mental health status. Luellen Pucker informed group that [facility] cannot provide assistance with finding placement if pt is not working with mental health.   Legrand Como updated group that referrals had been sent to all the ALFs in the Epic system.   Aldona Lento requested a copy of the FL2 be sent to the larger group. CSW team informed her that it would be updated and sent over. Aldona Lento also shared that she sent contact tracker over for CSW team to add the places that had been contacted to the larger list. No other concerns expressed. Contact ended without incident.   [name]. Guerry Bruin, MSW, LCSW, [place] 01/02/2023 11:10 AM

## 2023-01-02 NOTE — Group Note (Signed)
LCSW Group Therapy Note  Group Date: 01/02/2023 Start Time: 1300 End Time: 1400   Type of Therapy and Topic:  Group Therapy - How To Cope with Nervousness about Discharge   Participation Level:  None   Description of Group This process group involved identification of patients' feelings about discharge. Some of them are scheduled to be discharged soon, while others are new admissions, but each of them was asked to share thoughts and feelings surrounding discharge from the hospital. One common theme was that they are excited at the prospect of going home, while another was that many of them are apprehensive about sharing why they were hospitalized. Patients were given the opportunity to discuss these feelings with their peers in preparation for discharge.  Therapeutic Goals  Patient will identify their overall feelings about pending discharge. Patient will think about how they might proactively address issues that they believe will once again arise once they get home (i.e. with parents). Patients will participate in discussion about having hope for change.   Summary of Patient Progress:  Patient unable to participate in group due to latent effects of TBI.  Therapeutic Modalities Cognitive Behavioral Therapy   [name] 01/02/2023  2:17 PM

## 2023-01-02 NOTE — Progress Notes (Signed)
[facility] MD Progress Note 01/02/2023 10:32 AM Adam [name]  MRN:  [id number] Subjective: Follow-up for this patient with schizophrenia.  No change to behavior.  No new problems.  Better mood than yesterday.  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)   Stroke (HCC)   HTN (hypertension)   HLD (hyperlipidemia)   CAD (coronary artery disease)   Chronic systolic CHF (congestive heart failure) (HCC)   Anxiety   Infection of left earlobe  Total Time spent with patient: 20 minutes  Past Psychiatric History: History of schizophrenia  Past Medical History:  Past Medical History:  Diagnosis Date   Myocardial infarction (HCC)    Schizophrenia (HCC)    Stroke (HCC)     Past Surgical History:  Procedure Laterality Date   NO PAST SURGERIES     Family History:  Family History  Problem Relation Age of Onset   Hypertension Mother    Family Psychiatric  History: No change Social History:  Social History   Substance and Sexual Activity  Alcohol Use Not Currently     Social History   Substance and Sexual Activity  Drug Use Not Currently    Social History   Socioeconomic History   Marital status: Single    Spouse name: Not on file   Number of children: Not on file   Years of education: Not on file   Highest education level: Not on file  Occupational History   Not on file  Tobacco Use   Smoking status: Never    Passive exposure: Never   Smokeless tobacco: Never  Vaping Use   Vaping Use: Unknown  Substance and Sexual Activity   Alcohol use: Not Currently   Drug use: Not Currently   Sexual activity: Not Currently  Other Topics Concern   Not on file  Social History Narrative   Not on file   Social Determinants of  Health   Financial Resource Strain: Not on file  Food Insecurity: Not on file  Transportation Needs: Not on file  Physical Activity: Not on file  Stress: Not on file  Social Connections: Not on file   Additional Social History:  Specify valuables returned:  (none)                      Sleep: Fair  Appetite:  Fair  Current Medications: Current Facility-Administered Medications  Medication Dose Route Frequency Provider Last Rate Last Admin   acetaminophen (TYLENOL) tablet 650 mg  650 mg Oral Q6H PRN Jonan Seufert T, MD   650 mg at 12/31/22 0854   alum & mag hydroxide-simeth (MAALOX/MYLANTA) 200-200-20 MG/5ML suspension 30 mL  30 mL Oral Q4H PRN Taariq Leitz T, MD   30 mL at 04/26/22 4098   aspirin EC tablet 81 mg  81 mg Oral Daily Rehan Holness, [name], MD   81 mg at 01/02/23 0842   atorvastatin (LIPITOR) tablet 20 mg  20 mg Oral Daily Shaylene Paganelli T, MD   20 mg at 01/02/23 0841   atropine 1 % ophthalmic solution 1 drop  1 drop Sublingual QID [name], MD   1 drop at 01/02/23 0842   benztropine (COGENTIN) tablet 0.5 mg  0.5 mg Oral BID Conception Doebler T, MD   0.5 mg at 01/02/23 0841   clonazePAM (KLONOPIN) tablet 1 mg  1 mg Oral TID PRN He, [name], MD   1 mg at 12/31/22 1414   cloZAPine (CLOZARIL) tablet 300 mg  300 mg Oral QHS Shivansh Hardaway T, MD   300 mg at 12/31/22 2134   diphenhydrAMINE (BENADRYL) capsule 50 mg  50 mg Oral Q6H PRN Zyree Traynham, [name], MD   50 mg at 12/31/22 1414   Or   diphenhydrAMINE (BENADRYL) injection 50 mg  50 mg Intramuscular Q6H PRN Zahava Quant, [name], MD   50 mg at 01/01/23 2252   feeding supplement (ENSURE ENLIVE / ENSURE PLUS) liquid 237 mL  237 mL Oral BID BM Mallarie Voorhies T, MD   237 mL at 01/01/23 1231   gabapentin (NEURONTIN) capsule 300 mg  300 mg Oral TID Farran Amsden T, MD   300 mg at 01/02/23 0900   haloperidol (HALDOL) tablet 2 mg  2 mg Oral TID Gjon Letarte, [name], MD   2 mg at 01/02/23 0900   haloperidol (HALDOL) tablet 5 mg  5 mg Oral Q6H PRN  Casy Tavano, [name], MD   5 mg at 12/14/22 1709   Or   haloperidol lactate (HALDOL) injection 5 mg  5 mg Intramuscular Q6H PRN Kruz Chiu, [name], MD   5 mg at 01/01/23 2252   lisinopril (ZESTRIL) tablet 5 mg  5 mg Oral Daily Niki Cosman, [name], MD   5 mg at 01/02/23 1610   magnesium hydroxide (MILK OF MAGNESIA) suspension 30 mL  30 mL Oral Daily PRN Anarosa Kubisiak, [name], MD   30 mL at 12/03/22 1526   metoprolol succinate (TOPROL-XL) 24 hr tablet 25 mg  25 mg Oral Daily Corynne Scibilia T, MD   25 mg at 01/02/23 0841   ondansetron (ZOFRAN) tablet 4 mg  4 mg Oral Q8H PRN Deloria Lair, NP   4 mg at 09/06/22 2320   paliperidone (INVEGA SUSTENNA) injection 156 mg  156 mg Intramuscular Q28 days Devann Cribb, [name], MD   156 mg at 12/18/22 1654   paliperidone (INVEGA) 24 hr tablet 6 mg  6 mg Oral QHS Dalani Mette T, MD   6 mg at 12/31/22 2137   senna-docusate (Senokot-S) tablet 2 tablet  2 tablet Oral QHS PRN [name], DO   2 tablet at 09/15/22 2110   temazepam (RESTORIL) capsule 15 mg  15 mg Oral QHS Chaun Uemura T, MD   15 mg at 12/31/22 2135   ziprasidone (GEODON) injection 20 mg  20 mg Intramuscular Q12H PRN Ronnie Mallette, [name], MD   20 mg at 07/08/22 1453    Lab Results:  Results for orders placed or performed during the hospital encounter of 03/19/22 (from the past 48 hour(s))  CBC with Differential/Platelet     Status: Abnormal   Collection Time: 12/31/22  9:12 PM  Result Value Ref Range   WBC 5.4 4.0 - 10.5 K/uL   RBC 4.41 4.22 - 5.81 MIL/uL   Hemoglobin 13.5 13.0 - 17.0 g/dL   HCT 39.6 39.0 - 52.0 %   MCV 89.8 80.0 - 100.0 fL   MCH 30.6 26.0 - 34.0 pg   MCHC 34.1 30.0 - 36.0 g/dL   RDW 13.0 11.5 - 15.5 %   Platelets 152 150 - 400 K/uL   nRBC 0.0 0.0 - 0.2 %   Neutrophils Relative % 63 %   Neutro Abs 3.4 1.7 - 7.7 K/uL   Lymphocytes Relative 24 %   Lymphs Abs 1.3 0.7 - 4.0 K/uL   Monocytes Relative 10 %  Monocytes Absolute 0.5 0.1 - 1.0 K/uL   Eosinophils Relative 0 %   Eosinophils  Absolute 0.0 0.0 - 0.5 K/uL   Basophils Relative 3 %   Basophils Absolute 0.2 (H) 0.0 - 0.1 K/uL   Immature Granulocytes 0 %   Abs Immature Granulocytes 0.01 0.00 - 0.07 K/uL    Comment: Performed at [facility], [address] [place], [place] [address]    Blood Alcohol level:  Lab Results  Component Value Date   [facility] <10 48/54/6270    Metabolic Disorder Labs: Lab Results  Component Value Date   HGBA1C 5.5 04/10/2022   MPG 111.15 04/10/2022   No results found for: "PROLACTIN" Lab Results  Component Value Date   CHOL 167 11/20/2021   TRIG 107 11/20/2021   HDL 26 (L) 11/20/2021   CHOLHDL 6.4 11/20/2021   VLDL 21 11/20/2021   LDLCALC 120 (H) 11/20/2021    Physical Findings: AIMS: Facial and Oral Movements Muscles of Facial Expression: None, normal Lips and Perioral Area: None, normal Jaw: None, normal Tongue: None, normal,Extremity Movements Upper (arms, wrists, hands, fingers): None, normal Lower (legs, knees, ankles, toes): None, normal, Trunk Movements Neck, shoulders, hips: None, normal, Overall Severity Severity of abnormal movements (highest score from questions above): None, normal Incapacitation due to abnormal movements: None, normal Patient's awareness of abnormal movements (rate only patient's report): No Awareness, Dental Status Current problems with teeth and/or dentures?: No Does patient usually wear dentures?: No  CIWA:    COWS:     Musculoskeletal: Strength & Muscle Tone: within normal limits Gait & Station: normal Patient leans: N/A  Psychiatric Specialty Exam:  Presentation  General Appearance:  Appropriate for Environment; Casual; Neat; Well Groomed  Eye Contact: Fair  Speech: Slow  Speech Volume: Decreased  Handedness: Left   Mood and Affect  Mood: Anxious  Affect: Congruent   Thought Process  Thought Processes: Goal Directed  Descriptions of Associations:Circumstantial  Orientation:Partial  Thought  Content:Scattered  History of Schizophrenia/Schizoaffective disorder:Yes  Duration of Psychotic Symptoms:Greater than six months  Hallucinations:No data recorded Ideas of Reference:None  Suicidal Thoughts:No data recorded Homicidal Thoughts:No data recorded  Sensorium  Memory: Remote Good  Judgment: Fair  Insight: Fair   Materials engineer: Fair  Attention Span: Fair  Recall: [employer] of Knowledge: Fair  Language: Fair   Psychomotor Activity  Psychomotor Activity:No data recorded  Assets  Assets:No data recorded  Sleep  Sleep:No data recorded   Physical Exam: Physical Exam Constitutional:      Appearance: Normal appearance.  HENT:     Head: Normocephalic and atraumatic.     Mouth/Throat:     Pharynx: Oropharynx is clear.  Eyes:     Pupils: Pupils are equal, round, and reactive to light.  Cardiovascular:     Rate and Rhythm: Normal rate and regular rhythm.  Pulmonary:     Effort: Pulmonary effort is normal.     Breath sounds: Normal breath sounds.  Abdominal:     General: Abdomen is flat.     Palpations: Abdomen is soft.  Musculoskeletal:        General: Normal range of motion.  Skin:    General: Skin is warm and dry.  Neurological:     General: No focal deficit present.     Mental Status: He is alert. Mental status is at baseline.  Psychiatric:        Attention and Perception: He is inattentive.        Mood  and Affect: Mood normal. Affect is blunt.        Speech: He is noncommunicative.    Review of Systems  Unable to perform ROS: Language   Blood pressure 97/78, pulse 90, temperature 98.2 F (36.8 C), temperature source Oral, resp. rate 18, height 5\' 8"  (1.727 m), weight 85.8 kg, SpO2 100 %. Body mass index is 28.76 kg/m.   Treatment Plan Summary: Plan completely stable.  No change to any treatment.  Awaiting disposition plan.  Alethia Berthold, MD 01/02/2023, 10:32 AM

## 2023-01-02 NOTE — Progress Notes (Signed)
[facility] LCSW Progress Note   01/02/2023    10:39 AM  Adam [name]   [id number]   Type of Contact and Topic: ALF Referral   CSW made referral for patient to be reviewed by ALF facilities. See list below.   Destination  Service Provider Request Status Address Phone Fax  [place] [facility]  Pending - Request Sent [address], [place] [address] [phone]  [facility]  Pending - Request Sent [address] [place] [address] [phone]  [facility] PREFERRED [facility]F/ALF  Pending - Request Sent [address] [place] [address] [phone]  [facility] [place] [address] [place] [address] [phone]  [facility]  Pending - Request Sent [address] [place], [place] [address] [phone]  [facility] [facility]  Pending - Request Sent [address], [place] [address] [phone]  [facility]  Pending - Request Sent [address] [place], [place] [address] [phone]  [facility] [address] [place], [place] [address] [phone]  [facility]  Pending - [place] [address] [place] [address] [phone]  [facility] [place] [phone] [address], [place] [address] [phone]  [facility]  Pending - Request Sent [address] [place], [place] [address] [phone]  [facility]  Pending - Request Sent [address], [place] [address] [phone]  [facility] [facility]  Pending - Request Sent [address] [place] [address] [phone]  [facility]  Pending - Request Sent  [address] [place] [address] [phone]  [facility] [facility]  Pending - Request Sent [place], [place] [address] [phone]  [facility] [place] [facility]  Pending - Request Sent [address] [place] [address] [phone]  [facility] [place] [facility]  Pending - Request Sent [address] [place] [address] [phone]  [facility]  Pending - Request Sent [address], [place] [address] [phone]  [facility]  Pending - Request Sent [phone] [address] [place] [address] [phone] [name]  Pending - Request Sent [phone] [address], [place] [address] [phone]  [facility]  Pending - Request Sent [address] [place] [address] [phone]  [facility]  Pending - Request Sent [address] [place] [address] [phone]  [facility]  Pending - Request Sent [address], [place] [address] [place]  [facility]  Pending - Request Sent [address], [place] [address] [phone]  [facility]  Pending - Request Sent [phone] [address] [place], [place] [address] [phone]  [facility]  Pending - Request Sent [address] [place] [address] [phone]  [place] [address] [place] [address] [phone]  [facility]  Pending - Request Sent [address], [place] [address] [phone]  [facility]  Pending - Request Sent [phone]  [address], [place] [address] [phone]   [facility]  Pending - Request Sent [address] [place], [place] [address] [phone]  [facility] SN[facility]ALF  Pending - Request Sent [address] [place] [address] [phone]  [facility] [place] [address], [place] [address] [phone]  [facility]  Pending - Request Sent [address] [place] [address] [phone]  [facility]  Pending - Request Sent [address], [place] [address] [phone]  [facility] [place]  Pending - Request Sent [address], [place] [address] [phone] [facility]  Pending - Request Sent [address], [place] [address] [phone] [place] [facility] [place] [facility]F  Pending - Request Sent [address], [place] [address] [phone]  [facility] [address] [place] [address] [phone]  [facility]  Pending - Request Sent [address] [place] [address] [phone] [place] [address] [place] [address] [phone]  [facility]  Pending - [place] [address] [place] [address] [phone]  [place] SNF/ALF  Pending - Request Sent [address] [place] [address] [phone]  [facility] [address] [phone]  [facility]  Pending - Request Sent [place], [place] [address] [phone]  [facility] [place]  [address] [place] [address] [phone]  [facility]  Pending - Request Sent [address] [place] [address] [phone] [place] [facility]/ALF  Pending - [place] [address] [place] [address] [phone]  [facility]  Pending - Request Sent [address] [place] [address] [phone]  [facility]  Pending - Request Sent [place], [place] [address] [phone]  [facility] Preferred SNF  Declined [address], [place] [address] [phone]     Signed:  [name], MSW, LCSWA, LCAS 01/02/2023 10:39 AM

## 2023-01-03 NOTE — Progress Notes (Signed)
Patient calm and pleasant during assessment. Pt denies SI/HI/AVH. Pt observed interacting appropriately with staff and peers on the unit. Pt compliant with medication administration per MD orders. Pt refused his scheduled medication tonight, pt give education, still refused. Pt being monitored Q 15 minutes for safety per unit protocol, remains safe on the unit    

## 2023-01-03 NOTE — Group Note (Signed)
[facility] LCSW Group Therapy Note   Group Date: 01/03/2023 Start Time: 1300 End Time: 1400   Type of Therapy/Topic:  Group Therapy:  Balance in Life  Participation Level:  Minimal   Description of Group:    This group will address the concept of balance and how it feels and looks when one is unbalanced. Patients will be encouraged to process areas in their lives that are out of balance, and identify reasons for remaining unbalanced. Facilitators will guide patients utilizing problem- solving interventions to address and correct the stressor making their life unbalanced. Understanding and applying boundaries will be explored and addressed for obtaining  and maintaining a balanced life. Patients will be encouraged to explore ways to assertively make their unbalanced needs known to significant others in their lives, using other group members and facilitator for support and feedback.  Therapeutic Goals: Patient will identify two or more emotions or situations they have that consume much of in their lives. Patient will identify signs/triggers that life has become out of balance:  Patient will identify two ways to set boundaries in order to achieve balance in their lives:  Patient will demonstrate ability to communicate their needs through discussion and/or role plays  Summary of Patient Progress: Patient was present for some of the group. His contribution to the discussion was "Winter" which he repeated throughout the group process.    Therapeutic Modalities:   Cognitive Behavioral Therapy Solution-Focused Therapy Assertiveness Training   Shirl Harris, LCSW

## 2023-01-03 NOTE — [facility] Group Notes (Signed)
[facility] Group Notes:  (Nursing/MHT/Case Management/Adjunct)  Date:  01/03/2023  Time:  10:07 AM  Type of Therapy:   Community Meeting  Participation Level:  Minimal  Participation Quality:  Appropriate  Affect:  Appropriate  Cognitive:  Appropriate  Insight:  Appropriate  Engagement in Group:  Engaged  Modes of Intervention:  Discussion and Education  Summary of Progress/Problems:  Adam [name] [name] 01/03/2023, 10:07 AM

## 2023-01-03 NOTE — Progress Notes (Signed)
[facility] MD Progress Note  01/03/2023 3:26 PM Adam [name]  MRN:  [id number] Subjective: Follow-up patient with schizophrenia.  No new complaint.  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[place])   Stroke (HCC)   HTN (hypertension)   HLD (hyperlipidemia)   CAD (coronary artery disease)   Chronic systolic CHF (congestive heart failure) (HCC)   Anxiety   Infection of left earlobe  Total Time spent with patient: 30 minutes  Past Psychiatric History: Past history of schizophrenia  Past Medical History:  Past Medical History:  Diagnosis Date   Myocardial infarction ([place])    Schizophrenia ([place])    Stroke ([place])     Past Surgical History:  Procedure Laterality Date   NO PAST SURGERIES     Family History:  Family History  Problem Relation Age of Onset   Hypertension Mother    Family Psychiatric  History: See previous Social History:  Social History   Substance and Sexual Activity  Alcohol Use Not Currently     Social History   Substance and Sexual Activity  Drug Use Not Currently    Social History   Socioeconomic History   Marital status: Single    Spouse name: Not on file   Number of children: Not on file   Years of education: Not on file   Highest education level: Not on file  Occupational History   Not on file  Tobacco Use   Smoking status: Never    Passive exposure: Never   Smokeless tobacco: Never  Vaping Use   Vaping Use: Unknown  Substance and Sexual Activity   Alcohol use: Not Currently   Drug use: Not Currently   Sexual activity: Not Currently  Other Topics Concern   Not on file  Social History Narrative   Not on file   Social Determinants of Health   Financial Resource Strain: Not on file  Food Insecurity: Not  on file  Transportation Needs: Not on file  Physical Activity: Not on file  Stress: Not on file  Social Connections: Not on file   Additional Social History:  Specify valuables returned:  (none)                      Sleep: Fair  Appetite:  Fair  Current Medications: Current Facility-Administered Medications  Medication Dose Route Frequency Provider Last Rate Last Admin   acetaminophen (TYLENOL) tablet 650 mg  650 mg Oral Q6H PRN Anglea Gordner T, MD   650 mg at 12/31/22 0854   alum & mag hydroxide-simeth (MAALOX/MYLANTA) 200-200-20 MG/5ML suspension 30 mL  30 mL Oral Q4H PRN Ranson Belluomini T, MD   30 mL at 04/26/22 6387   aspirin EC tablet 81 mg  81 mg Oral Daily Kambre Messner T, MD   81 mg at 01/03/23 0853   atorvastatin (LIPITOR) tablet 20 mg  20 mg Oral Daily Darril Patriarca T, MD   20 mg at 01/03/23 0853   atropine 1 % ophthalmic solution 1 drop  1 drop Sublingual QID [name], MD   1 drop at 01/03/23 1220   benztropine (COGENTIN) tablet 0.5 mg  0.5 mg Oral BID Aidee Latimore T, MD   0.5 mg at 01/03/23 0853   clonazePAM (KLONOPIN) tablet 1 mg  1 mg Oral TID PRN He, [name], MD  1 mg at 12/31/22 1414   cloZAPine (CLOZARIL) tablet 300 mg  300 mg Oral QHS Calogero Geisen T, MD   300 mg at 01/02/23 2100   diphenhydrAMINE (BENADRYL) capsule 50 mg  50 mg Oral Q6H PRN Raynie Steinhaus T, MD   50 mg at 12/31/22 1414   Or   diphenhydrAMINE (BENADRYL) injection 50 mg  50 mg Intramuscular Q6H PRN Tarvaris Puglia T, MD   50 mg at 01/01/23 2252   feeding supplement (ENSURE ENLIVE / ENSURE PLUS) liquid 237 mL  237 mL Oral BID BM Chad Tiznado T, MD   474 mL at 01/03/23 0853   gabapentin (NEURONTIN) capsule 300 mg  300 mg Oral TID Tynlee Bayle T, MD   300 mg at 01/03/23 0856   haloperidol (HALDOL) tablet 2 mg  2 mg Oral TID Harjas Biggins T, MD   2 mg at 01/03/23 0855   haloperidol (HALDOL) tablet 5 mg  5 mg Oral Q6H PRN Satine Hausner, [name], MD   5 mg at 12/14/22 1709   Or   haloperidol lactate  (HALDOL) injection 5 mg  5 mg Intramuscular Q6H PRN Abilene Mcphee T, MD   5 mg at 01/01/23 2252   lisinopril (ZESTRIL) tablet 5 mg  5 mg Oral Daily Kobey Sides T, MD   5 mg at 01/03/23 3212   magnesium hydroxide (MILK OF MAGNESIA) suspension 30 mL  30 mL Oral Daily PRN Aries Kasa T, MD   30 mL at 12/03/22 1526   metoprolol succinate (TOPROL-XL) 24 hr tablet 25 mg  25 mg Oral Daily Hanae Waiters T, MD   25 mg at 01/03/23 0852   ondansetron (ZOFRAN) tablet 4 mg  4 mg Oral Q8H PRN [name], NP   4 mg at 09/06/22 2320   paliperidone (INVEGA SUSTENNA) injection 156 mg  156 mg Intramuscular Q28 days Gearldene Fiorenza T, MD   156 mg at 12/18/22 1654   paliperidone (INVEGA) 24 hr tablet 6 mg  6 mg Oral QHS Loraine Freid T, MD   6 mg at 01/02/23 2101   senna-docusate (Senokot-S) tablet 2 tablet  2 tablet Oral QHS PRN [name], DO   2 tablet at 09/15/22 2110   temazepam (RESTORIL) capsule 15 mg  15 mg Oral QHS Michon Kaczmarek T, MD   15 mg at 01/02/23 2100   ziprasidone (GEODON) injection 20 mg  20 mg Intramuscular Q12H PRN Devi Hopman, [name], MD   20 mg at 07/08/22 1453    Lab Results: No results found for this or any previous visit (from the past 48 hour(s)).  Blood Alcohol level:  Lab Results  Component Value Date   ETH <10 24/82/5003    Metabolic Disorder Labs: Lab Results  Component Value Date   HGBA1C 5.5 04/10/2022   MPG 111.15 04/10/2022   No results found for: "PROLACTIN" Lab Results  Component Value Date   CHOL 167 11/20/2021   TRIG 107 11/20/2021   HDL 26 (L) 11/20/2021   CHOLHDL 6.4 11/20/2021   VLDL 21 11/20/2021   LDLCALC 120 (H) 11/20/2021    Physical Findings: AIMS: Facial and Oral Movements Muscles of Facial Expression: None, normal Lips and Perioral Area: None, normal Jaw: None, normal Tongue: None, normal,Extremity Movements Upper (arms, wrists, hands, fingers): None, normal Lower (legs, knees, ankles, toes): None, normal, Trunk Movements Neck,  shoulders, hips: None, normal, Overall Severity Severity of abnormal movements (highest score from questions above): None, normal Incapacitation due to abnormal movements: None,  normal Patient's awareness of abnormal movements (rate only patient's report): No Awareness, Dental Status Current problems with teeth and/or dentures?: No Does patient usually wear dentures?: No  CIWA:    COWS:     Musculoskeletal: Strength & Muscle Tone: within normal limits Gait & Station: normal Patient leans: N/A  Psychiatric Specialty Exam:  Presentation  General Appearance:  Appropriate for Environment; Casual; Neat; Well Groomed  Eye Contact: Fair  Speech: Slow  Speech Volume: Decreased  Handedness: Left   Mood and Affect  Mood: Anxious  Affect: Congruent   Thought Process  Thought Processes: Goal Directed  Descriptions of Associations:Circumstantial  Orientation:Partial  Thought Content:Scattered  History of Schizophrenia/Schizoaffective disorder:Yes  Duration of Psychotic Symptoms:Greater than six months  Hallucinations:No data recorded Ideas of Reference:None  Suicidal Thoughts:No data recorded Homicidal Thoughts:No data recorded  Sensorium  Memory: Remote Good  Judgment: Fair  Insight: Fair   Community education officer  Concentration: Fair  Attention Span: Fair  Recall: [employer] of Knowledge: Fair  Language: Fair   Psychomotor Activity  Psychomotor Activity:No data recorded  Assets  Assets:No data recorded  Sleep  Sleep:No data recorded   Physical Exam: Physical Exam Vitals and nursing note reviewed.  Constitutional:      Appearance: Normal appearance.  HENT:     Head: Normocephalic and atraumatic.     Mouth/Throat:     Pharynx: Oropharynx is clear.  Eyes:     Pupils: Pupils are equal, round, and reactive to light.  Cardiovascular:     Rate and Rhythm: Normal rate and regular rhythm.  Pulmonary:     Effort: Pulmonary effort  is normal.     Breath sounds: Normal breath sounds.  Abdominal:     General: Abdomen is flat.     Palpations: Abdomen is soft.  Musculoskeletal:        General: Normal range of motion.  Skin:    General: Skin is warm and dry.  Neurological:     General: No focal deficit present.     Mental Status: He is alert. Mental status is at baseline.  Psychiatric:        Attention and Perception: Attention normal.        Mood and Affect: Mood normal.        Speech: Speech normal.        Behavior: Behavior normal.        Thought Content: Thought content normal.        Cognition and Memory: Cognition normal.    Review of Systems  Constitutional: Negative.   HENT: Negative.    Eyes: Negative.   Respiratory: Negative.    Cardiovascular: Negative.   Gastrointestinal: Negative.   Musculoskeletal: Negative.   Skin: Negative.   Neurological: Negative.   Psychiatric/Behavioral: Negative.     Blood pressure 115/73, pulse 89, temperature 98.2 F (36.8 C), temperature source Oral, resp. rate 18, height 5\' 8"  (1.727 m), weight 85.8 kg, SpO2 98 %. Body mass index is 28.76 kg/m.   Treatment Plan Summary: Medication management and Plan no change to medication.  No change to overall treatment.  Continue to focus on discharge  Alethia Berthold, MD 01/03/2023, 3:26 PM

## 2023-01-03 NOTE — [facility] Group Notes (Signed)
01/03/2023         Time: 1000      Group Topic/Focus: Goal Setting  Participation Level: Minimal  Participation Quality: Appropriate  Affect: Blunted  Cognitive: Alert   Additional Comments:  Patient attended Goal Setting group. Patient minimally engaged in discussion that educated on SMART goals and wrote "God" on paper 10 different times.    [name] LRT/CTRS  01/03/2023 12:32 PM

## 2023-01-03 NOTE — Plan of Care (Signed)
  Problem: Education: Goal: Ability to state activities that reduce stress will improve Outcome: Not Progressing   Problem: Coping: Goal: Ability to identify and develop effective coping behavior will improve Outcome: Not Progressing   Problem: Self-Concept: Goal: Ability to identify factors that promote anxiety will improve Outcome: Not Progressing Goal: Level of anxiety will decrease Outcome: Not Progressing Goal: Ability to modify response to factors that promote anxiety will improve Outcome: Not Progressing   Problem: Education: Goal: Knowledge of General Education information will improve Description: Including pain rating scale, medication(s)/side effects and non-pharmacologic comfort measures Outcome: Not Progressing   Problem: Health Behavior/Discharge Planning: Goal: Ability to manage health-related needs will improve Outcome: Not Progressing   Problem: Clinical Measurements: Goal: Ability to maintain clinical measurements within normal limits will improve Outcome: Not Progressing Goal: Will remain free from infection Outcome: Not Progressing Goal: Diagnostic test results will improve Outcome: Not Progressing Goal: Respiratory complications will improve Outcome: Not Progressing Goal: Cardiovascular complication will be avoided Outcome: Not Progressing   Problem: Activity: Goal: Risk for activity intolerance will decrease Outcome: Not Progressing   Problem: Nutrition: Goal: Adequate nutrition will be maintained Outcome: Not Progressing   Problem: Coping: Goal: Level of anxiety will decrease Outcome: Not Progressing   Problem: Elimination: Goal: Will not experience complications related to bowel motility Outcome: Not Progressing Goal: Will not experience complications related to urinary retention Outcome: Not Progressing   Problem: Pain Managment: Goal: General experience of comfort will improve Outcome: Not Progressing   Problem: Safety: Goal:  Ability to remain free from injury will improve Outcome: Not Progressing   Problem: Skin Integrity: Goal: Risk for impaired skin integrity will decrease Outcome: Not Progressing   Problem: Activity: Goal: Interest or engagement in leisure activities will improve Outcome: Not Progressing   Problem: Coping: Goal: Coping ability will improve Outcome: Not Progressing   Problem: Education: Goal: Will be free of psychotic symptoms Outcome: Not Progressing   

## 2023-01-03 NOTE — Progress Notes (Signed)
D- Patient alert and oriented 1-2. Affect animated/moodcalm. Denies SI/ HI, AVH. He denies pain. Quiet and calm this shift. A- Scheduled medications administered to patient, per MD orders. Support and encouragement provided.  Routine safety checks conducted every 15 minutes.  Patient informed to notify staff with problems or concerns. R- No adverse drug reactions noted. Patient compliant with medications and treatment plan. Patient calm and cooperative, easily redirectable. Patien has been in his room for some of the shift resting in bed. He contracts for safety and remains safe on the unit at this time.

## 2023-01-04 NOTE — Progress Notes (Signed)
Patient came to the nurses station with shaving cream, in which this writer knows from previous encounters that he wants to shave. This writer shaved patient's face, in which he tolerated it well, without any issues. Patient remains safe on the unit.

## 2023-01-04 NOTE — Plan of Care (Signed)
D- Patient alert and oriented to person, place, and situation. Patient is childlike and due to a past stroke, he has expressive aphasia. Patient initially presented in an irritable mood because he saw another patient with a tablet, filling out a patient survey, which made him want one. Patient had to be redirected multiple times, but he eventually calmed down after a while. Patient denied SI, HI, AVH, and pain at this time. Patient also denied any signs/symptoms of depression and anxiety by shaking his head and stating "no". Patient had no stated goals for today.  A- Scheduled medications administered to patient, per MD orders. Support and encouragement provided.  Routine safety checks conducted every 15 minutes.  Patient informed to notify staff with problems or concerns.  R- No adverse drug reactions noted. Patient contracts for safety at this time. Patient compliant with medications. Patient remains safe at this time.  Problem: Education: Goal: Ability to state activities that reduce stress will improve Outcome: Not Progressing   Problem: Coping: Goal: Ability to identify and develop effective coping behavior will improve Outcome: Not Progressing   Problem: Self-Concept: Goal: Ability to identify factors that promote anxiety will improve Outcome: Not Progressing Goal: Level of anxiety will decrease Outcome: Not Progressing Goal: Ability to modify response to factors that promote anxiety will improve Outcome: Not Progressing   Problem: Education: Goal: Knowledge of General Education information will improve Description: Including pain rating scale, medication(s)/side effects and non-pharmacologic comfort measures Outcome: Not Progressing   Problem: Health Behavior/Discharge Planning: Goal: Ability to manage health-related needs will improve Outcome: Not Progressing   Problem: Clinical Measurements: Goal: Ability to maintain clinical measurements within normal limits will  improve Outcome: Not Progressing Goal: Will remain free from infection Outcome: Not Progressing Goal: Diagnostic test results will improve Outcome: Not Progressing Goal: Respiratory complications will improve Outcome: Not Progressing Goal: Cardiovascular complication will be avoided Outcome: Not Progressing   Problem: Activity: Goal: Risk for activity intolerance will decrease Outcome: Not Progressing   Problem: Nutrition: Goal: Adequate nutrition will be maintained Outcome: Not Progressing   Problem: Coping: Goal: Level of anxiety will decrease Outcome: Not Progressing   Problem: Elimination: Goal: Will not experience complications related to bowel motility Outcome: Not Progressing Goal: Will not experience complications related to urinary retention Outcome: Not Progressing   Problem: Pain Managment: Goal: General experience of comfort will improve Outcome: Not Progressing   Problem: Safety: Goal: Ability to remain free from injury will improve Outcome: Not Progressing   Problem: Skin Integrity: Goal: Risk for impaired skin integrity will decrease Outcome: Not Progressing   Problem: Activity: Goal: Interest or engagement in leisure activities will improve Outcome: Not Progressing   Problem: Coping: Goal: Coping ability will improve Outcome: Not Progressing   Problem: Education: Goal: Will be free of psychotic symptoms Outcome: Not Progressing

## 2023-01-04 NOTE — Progress Notes (Signed)
Patient is asleep, so this writer will administer scheduled morning medication once he wakes up. MD will be made aware.

## 2023-01-04 NOTE — [facility] Group Notes (Signed)
[facility] Group Notes:  (Nursing/MHT/Case Management/Adjunct)  Date:  01/04/2023  Time:  9:00 PM  Type of Therapy:  Group Therapy  Participation Level:  Active  Participation Quality:  Appropriate and Sharing  Affect:  Appropriate  Cognitive:  Appropriate  Insight:  Appropriate  Engagement in Group:  Limited  Modes of Intervention:      Summary of Progress/Problems:   Ladona Mow 01/04/2023, 9:00 PM

## 2023-01-04 NOTE — Progress Notes (Addendum)
[facility] LCSW Progress Note   01/04/2023    2:45 PM  Adam [name]   [id number]   Type of Contact and Topic:  Care Coordination   CSW facilitated meeting between patient and Ainsley Spinner, group home owner. Meeting went well, Mr. Adam [name] states that he will likely take patient so long as financial requirements are met. CSW team to contact [place] Coordination Supervisor to have Mr. Adam [name] invited to next care coordination meeting for further coordination of care.    Situation ongoing, CSW will continue to monitor and update note as more information becomes available.   Signed:  [name], MSW, [place], LCAS 01/04/2023 2:45 PM

## 2023-01-04 NOTE — Progress Notes (Signed)
Patient was outside enjoying fresh air at the scheduled time for Haldol and Gabapentin. This writer pushed back his 2000 medication to 2200. This information will be passed along to the oncoming shift.

## 2023-01-04 NOTE — [facility] Group Notes (Signed)
[facility] Group Notes:  (Nursing/MHT/Case Management/Adjunct)  Date:  01/04/2023  Time:  9:54 AM  Type of Therapy:   community meeting  Participation Level:  Did not attend    Antonieta Pert 01/04/2023, 9:54 AM

## 2023-01-04 NOTE — Progress Notes (Signed)
[facility] MD Progress Note  01/04/2023 12:54 PM Adam [name]  MRN:  [id number] Subjective: Follow-up patient with schizophrenia.  No change to behavior.  Having a pretty good day today.  Not agitated not aggressive.  Takes care of his ADLs and eats well takes his medicine. 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[place])   Stroke (HCC)   HTN (hypertension)   HLD (hyperlipidemia)   CAD (coronary artery disease)   Chronic systolic CHF (congestive heart failure) (HCC)   Anxiety   Infection of left earlobe  Total Time spent with patient: 30 minutes  Past Psychiatric History: Past history of schizophrenia  Past Medical History:  Past Medical History:  Diagnosis Date   Myocardial infarction ([place])    Schizophrenia ([place])    Stroke ([place])     Past Surgical History:  Procedure Laterality Date   NO PAST SURGERIES     Family History:  Family History  Problem Relation Age of Onset   Hypertension Mother    Family Psychiatric  History: See previous Social History:  Social History   Substance and Sexual Activity  Alcohol Use Not Currently     Social History   Substance and Sexual Activity  Drug Use Not Currently    Social History   Socioeconomic History   Marital status: Single    Spouse name: Not on file   Number of children: Not on file   Years of education: Not on file   Highest education level: Not on file  Occupational History   Not on file  Tobacco Use   Smoking status: Never    Passive exposure: Never   Smokeless tobacco: Never  Vaping Use   Vaping Use: Unknown  Substance and Sexual Activity   Alcohol use: Not Currently   Drug use: Not Currently   Sexual activity: Not Currently  Other Topics Concern   Not on file  Social History Narrative   Not on  file   Social Determinants of Health   Financial Resource Strain: Not on file  Food Insecurity: Not on file  Transportation Needs: Not on file  Physical Activity: Not on file  Stress: Not on file  Social Connections: Not on file   Additional Social History:  Specify valuables returned:  (none)                      Sleep: Fair  Appetite:  Fair  Current Medications: Current Facility-Administered Medications  Medication Dose Route Frequency Provider Last Rate Last Admin   acetaminophen (TYLENOL) tablet 650 mg  650 mg Oral Q6H PRN Owenn Rothermel T, MD   650 mg at 12/31/22 0854   alum & mag hydroxide-simeth (MAALOX/MYLANTA) 200-200-20 MG/5ML suspension 30 mL  30 mL Oral Q4H PRN Nahun Kronberg T, MD   30 mL at 04/26/22 3875   aspirin EC tablet 81 mg  81 mg Oral Daily Doyt Castellana T, MD   81 mg at 01/04/23 1011   atorvastatin (LIPITOR) tablet 20 mg  20 mg Oral Daily Amera Banos T, MD   20 mg at 01/04/23 1011   atropine 1 % ophthalmic solution 1 drop  1 drop Sublingual QID [name], MD   1 drop at 01/04/23 1012   benztropine (COGENTIN) tablet 0.5 mg  0.5 mg Oral BID Kaliya Shreiner, [name], MD   0.5 mg at  01/04/23 1012   clonazePAM (KLONOPIN) tablet 1 mg  1 mg Oral TID PRN He, [name], MD   1 mg at 12/31/22 1414   cloZAPine (CLOZARIL) tablet 300 mg  300 mg Oral QHS Tichina Koebel T, MD   300 mg at 01/03/23 2113   diphenhydrAMINE (BENADRYL) capsule 50 mg  50 mg Oral Q6H PRN Aliveah Gallant T, MD   50 mg at 12/31/22 1414   Or   diphenhydrAMINE (BENADRYL) injection 50 mg  50 mg Intramuscular Q6H PRN Maimouna Rondeau T, MD   50 mg at 01/01/23 2252   feeding supplement (ENSURE ENLIVE / ENSURE PLUS) liquid 237 mL  237 mL Oral BID BM Anaiya Wisinski T, MD   237 mL at 01/04/23 1013   gabapentin (NEURONTIN) capsule 300 mg  300 mg Oral TID Sylas Twombly T, MD   300 mg at 01/04/23 1011   haloperidol (HALDOL) tablet 2 mg  2 mg Oral TID Lorice Lafave T, MD   2 mg at 01/04/23 1011   haloperidol (HALDOL)  tablet 5 mg  5 mg Oral Q6H PRN Shuronda Santino, [name], MD   5 mg at 12/14/22 1709   Or   haloperidol lactate (HALDOL) injection 5 mg  5 mg Intramuscular Q6H PRN Derricka Mertz T, MD   5 mg at 01/01/23 2252   lisinopril (ZESTRIL) tablet 5 mg  5 mg Oral Daily Katena Petitjean T, MD   5 mg at 01/04/23 1011   magnesium hydroxide (MILK OF MAGNESIA) suspension 30 mL  30 mL Oral Daily PRN Erica Richwine T, MD   30 mL at 12/03/22 1526   metoprolol succinate (TOPROL-XL) 24 hr tablet 25 mg  25 mg Oral Daily Gwendolyn Nishi T, MD   25 mg at 01/04/23 1011   ondansetron (ZOFRAN) tablet 4 mg  4 mg Oral Q8H PRN [name], NP   4 mg at 09/06/22 2320   paliperidone (INVEGA SUSTENNA) injection 156 mg  156 mg Intramuscular Q28 days Jaquavion Mccannon T, MD   156 mg at 12/18/22 1654   paliperidone (INVEGA) 24 hr tablet 6 mg  6 mg Oral QHS Xsavier [name] T, MD   6 mg at 01/03/23 2113   senna-docusate (Senokot-S) tablet 2 tablet  2 tablet Oral QHS PRN [name], DO   2 tablet at 09/15/22 2110   temazepam (RESTORIL) capsule 15 mg  15 mg Oral QHS Helayna Dun T, MD   15 mg at 01/03/23 2113   ziprasidone (GEODON) injection 20 mg  20 mg Intramuscular Q12H PRN Maudry Zeidan, [name], MD   20 mg at 07/08/22 1453    Lab Results: No results found for this or any previous visit (from the past 53 hour(s)).  Blood Alcohol level:  Lab Results  Component Value Date   ETH <10 70/35/0093    Metabolic Disorder Labs: Lab Results  Component Value Date   HGBA1C 5.5 04/10/2022   MPG 111.15 04/10/2022   No results found for: "PROLACTIN" Lab Results  Component Value Date   CHOL 167 11/20/2021   TRIG 107 11/20/2021   HDL 26 (L) 11/20/2021   CHOLHDL 6.4 11/20/2021   VLDL 21 11/20/2021   LDLCALC 120 (H) 11/20/2021    Physical Findings: AIMS: Facial and Oral Movements Muscles of Facial Expression: None, normal Lips and Perioral Area: None, normal Jaw: None, normal Tongue: None, normal,Extremity Movements Upper (arms,  wrists, hands, fingers): None, normal Lower (legs, knees, ankles, toes): None, normal, Trunk Movements Neck, shoulders, hips: None,  normal, Overall Severity Severity of abnormal movements (highest score from questions above): None, normal Incapacitation due to abnormal movements: None, normal Patient's awareness of abnormal movements (rate only patient's report): No Awareness, Dental Status Current problems with teeth and/or dentures?: No Does patient usually wear dentures?: No  CIWA:    COWS:     Musculoskeletal: Strength & Muscle Tone: within normal limits Gait & Station: normal Patient leans: N/A  Psychiatric Specialty Exam:  Presentation  General Appearance:  Appropriate for Environment; Casual; Neat; Well Groomed  Eye Contact: Fair  Speech: Slow  Speech Volume: Decreased  Handedness: Left   Mood and Affect  Mood: Anxious  Affect: Congruent   Thought Process  Thought Processes: Goal Directed  Descriptions of Associations:Circumstantial  Orientation:Partial  Thought Content:Scattered  History of Schizophrenia/Schizoaffective disorder:Yes  Duration of Psychotic Symptoms:Greater than six months  Hallucinations:No data recorded Ideas of Reference:None  Suicidal Thoughts:No data recorded Homicidal Thoughts:No data recorded  Sensorium  Memory: Remote Good  Judgment: Fair  Insight: Fair   Community education officer  Concentration: Fair  Attention Span: Fair  Recall: [employer] of Knowledge: Fair  Language: Fair   Psychomotor Activity  Psychomotor Activity:No data recorded  Assets  Assets:No data recorded  Sleep  Sleep:No data recorded   Physical Exam: Physical Exam Vitals and nursing note reviewed.  Constitutional:      Appearance: Normal appearance.  HENT:     Head: Normocephalic and atraumatic.     Mouth/Throat:     Pharynx: Oropharynx is clear.  Eyes:     Pupils: Pupils are equal, round, and reactive to light.   Cardiovascular:     Rate and Rhythm: Normal rate and regular rhythm.  Pulmonary:     Effort: Pulmonary effort is normal.     Breath sounds: Normal breath sounds.  Abdominal:     General: Abdomen is flat.     Palpations: Abdomen is soft.  Musculoskeletal:        General: Normal range of motion.  Skin:    General: Skin is warm and dry.  Neurological:     General: No focal deficit present.     Mental Status: He is alert. Mental status is at baseline.  Psychiatric:        Attention and Perception: He is inattentive.        Mood and Affect: Mood normal. Affect is blunt.        Speech: He is noncommunicative.    Review of Systems  Unable to perform ROS: Language   Blood pressure 123/84, pulse 99, temperature 98.9 F (37.2 C), temperature source Oral, resp. rate 18, height 5\' 8"  (1.727 m), weight 85.8 kg, SpO2 100 %. Body mass index is 28.76 kg/m.   Treatment Plan Summary: Medication management and Plan no change to medication.  No change to overall situation of waiting for some kind of placement plan to arrive.  Alethia Berthold, MD 01/04/2023, 12:54 PM

## 2023-01-05 NOTE — Progress Notes (Signed)
Patient is alert and oriented x 4, affect is flat but brightens upon approach, he appears less irritable and interacting appropriately with peers and staff. Patient denies SI/HI/AVH, he was offered emotional support. Patient was complaint with medication regimen no distress noted will continue to monitor.

## 2023-01-05 NOTE — Plan of Care (Signed)
D- Patient alert and oriented. Patient presented in a preoccupied, but pleasant mood on assessment, stating that he slept ok last night. Patient woke up with complaints of leg pain, in which PRN medication was requested and able to provide him with some relief. Patient denied SI, HI, AVH at this time. Patient also denied any signs/symptoms of depression and anxiety by shaking his head and saying "no". Patient had no stated goals for today.  A- Scheduled medications administered to patient, per MD orders. Support and encouragement provided.  Routine safety checks conducted every 15 minutes.  Patient informed to notify staff with problems or concerns.  R- No adverse drug reactions noted. Patient contracts for safety at this time. Patient compliant with medications and treatment plan. Patient receptive, calm, and cooperative. Patient interacts well with others on the unit. Patient remains safe at this time.  Problem: Education: Goal: Ability to state activities that reduce stress will improve Outcome: Progressing   Problem: Coping: Goal: Ability to identify and develop effective coping behavior will improve Outcome: Progressing   Problem: Self-Concept: Goal: Ability to identify factors that promote anxiety will improve Outcome: Progressing Goal: Level of anxiety will decrease Outcome: Progressing Goal: Ability to modify response to factors that promote anxiety will improve Outcome: Progressing   Problem: Education: Goal: Knowledge of General Education information will improve Description: Including pain rating scale, medication(s)/side effects and non-pharmacologic comfort measures Outcome: Progressing   Problem: Health Behavior/Discharge Planning: Goal: Ability to manage health-related needs will improve Outcome: Progressing   Problem: Clinical Measurements: Goal: Ability to maintain clinical measurements within normal limits will improve Outcome: Progressing Goal: Will remain free  from infection Outcome: Progressing Goal: Diagnostic test results will improve Outcome: Progressing Goal: Respiratory complications will improve Outcome: Progressing Goal: Cardiovascular complication will be avoided Outcome: Progressing   Problem: Activity: Goal: Risk for activity intolerance will decrease Outcome: Progressing   Problem: Nutrition: Goal: Adequate nutrition will be maintained Outcome: Progressing   Problem: Coping: Goal: Level of anxiety will decrease Outcome: Progressing   Problem: Elimination: Goal: Will not experience complications related to bowel motility Outcome: Progressing Goal: Will not experience complications related to urinary retention Outcome: Progressing   Problem: Pain Managment: Goal: General experience of comfort will improve Outcome: Progressing   Problem: Safety: Goal: Ability to remain free from injury will improve Outcome: Progressing   Problem: Skin Integrity: Goal: Risk for impaired skin integrity will decrease Outcome: Progressing   Problem: Activity: Goal: Interest or engagement in leisure activities will improve Outcome: Progressing   Problem: Coping: Goal: Coping ability will improve Outcome: Progressing   Problem: Education: Goal: Will be free of psychotic symptoms Outcome: Progressing

## 2023-01-05 NOTE — Progress Notes (Signed)
[facility] MD Progress Note  01/05/2023 10:33 AM Adam [name]  MRN:  [id number] Subjective: Follow-up patient with schizophrenia.  No new complaints no change to behavior. 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[place])   Stroke (HCC)   HTN (hypertension)   HLD (hyperlipidemia)   CAD (coronary artery disease)   Chronic systolic CHF (congestive heart failure) (HCC)   Anxiety   Infection of left earlobe  Total Time spent with patient: 15 minutes  Past Psychiatric History: Past history of schizophrenia  Past Medical History:  Past Medical History:  Diagnosis Date   Myocardial infarction ([place])    Schizophrenia ([place])    Stroke ([place])     Past Surgical History:  Procedure Laterality Date   NO PAST SURGERIES     Family History:  Family History  Problem Relation Age of Onset   Hypertension Mother    Family Psychiatric  History: See previous Social History:  Social History   Substance and Sexual Activity  Alcohol Use Not Currently     Social History   Substance and Sexual Activity  Drug Use Not Currently    Social History   Socioeconomic History   Marital status: Single    Spouse name: Not on file   Number of children: Not on file   Years of education: Not on file   Highest education level: Not on file  Occupational History   Not on file  Tobacco Use   Smoking status: Never    Passive exposure: Never   Smokeless tobacco: Never  Vaping Use   Vaping Use: Unknown  Substance and Sexual Activity   Alcohol use: Not Currently   Drug use: Not Currently   Sexual activity: Not Currently  Other Topics Concern   Not on file  Social History Narrative   Not on file   Social Determinants of Health   Financial Resource Strain: Not on file  Food Insecurity: Not  on file  Transportation Needs: Not on file  Physical Activity: Not on file  Stress: Not on file  Social Connections: Not on file   Additional Social History:  Specify valuables returned:  (none)                      Sleep: Fair  Appetite:  Fair  Current Medications: Current Facility-Administered Medications  Medication Dose Route Frequency Provider Last Rate Last Admin   acetaminophen (TYLENOL) tablet 650 mg  650 mg Oral Q6H PRN Jessejames Steelman T, MD   650 mg at 01/05/23 0754   alum & mag hydroxide-simeth (MAALOX/MYLANTA) 200-200-20 MG/5ML suspension 30 mL  30 mL Oral Q4H PRN Toriann Spadoni T, MD   30 mL at 04/26/22 4098   aspirin EC tablet 81 mg  81 mg Oral Daily Buena Boehm T, MD   81 mg at 01/05/23 0945   atorvastatin (LIPITOR) tablet 20 mg  20 mg Oral Daily Dnya Hickle T, MD   20 mg at 01/05/23 0946   atropine 1 % ophthalmic solution 1 drop  1 drop Sublingual QID [name], MD   1 drop at 01/05/23 0946   benztropine (COGENTIN) tablet 0.5 mg  0.5 mg Oral BID Nomi Rudnicki T, MD   0.5 mg at 01/05/23 0946   clonazePAM (KLONOPIN) tablet 1 mg  1 mg Oral TID PRN He, [name], MD   1  mg at 12/31/22 1414   cloZAPine (CLOZARIL) tablet 300 mg  300 mg Oral QHS Honest Vanleer T, MD   300 mg at 01/04/23 2135   diphenhydrAMINE (BENADRYL) capsule 50 mg  50 mg Oral Q6H PRN Lillia Lengel T, MD   50 mg at 12/31/22 1414   Or   diphenhydrAMINE (BENADRYL) injection 50 mg  50 mg Intramuscular Q6H PRN Jamerica Snavely T, MD   50 mg at 01/01/23 2252   feeding supplement (ENSURE ENLIVE / ENSURE PLUS) liquid 237 mL  237 mL Oral BID BM Tineka Uriegas T, MD   237 mL at 01/05/23 0945   gabapentin (NEURONTIN) capsule 300 mg  300 mg Oral TID Nemiah Bubar T, MD   300 mg at 01/05/23 0946   haloperidol (HALDOL) tablet 2 mg  2 mg Oral TID Adan Beal, [name], MD   2 mg at 01/05/23 0946   haloperidol (HALDOL) tablet 5 mg  5 mg Oral Q6H PRN Kanijah Groseclose, [name], MD   5 mg at 12/14/22 1709   Or   haloperidol lactate  (HALDOL) injection 5 mg  5 mg Intramuscular Q6H PRN Sofiah Lyne T, MD   5 mg at 01/01/23 2252   lisinopril (ZESTRIL) tablet 5 mg  5 mg Oral Daily Kaylem Gidney T, MD   5 mg at 01/05/23 0946   magnesium hydroxide (MILK OF MAGNESIA) suspension 30 mL  30 mL Oral Daily PRN Avanelle Pixley T, MD   30 mL at 12/03/22 1526   metoprolol succinate (TOPROL-XL) 24 hr tablet 25 mg  25 mg Oral Daily Karley Pho T, MD   25 mg at 01/05/23 0946   ondansetron (ZOFRAN) tablet 4 mg  4 mg Oral Q8H PRN [name], NP   4 mg at 09/06/22 2320   paliperidone (INVEGA SUSTENNA) injection 156 mg  156 mg Intramuscular Q28 days Erinn Huskins T, MD   156 mg at 12/18/22 1654   paliperidone (INVEGA) 24 hr tablet 6 mg  6 mg Oral QHS Mardi Cannady T, MD   6 mg at 01/04/23 2134   senna-docusate (Senokot-S) tablet 2 tablet  2 tablet Oral QHS PRN [name], DO   2 tablet at 09/15/22 2110   temazepam (RESTORIL) capsule 15 mg  15 mg Oral QHS Lynsay Fesperman T, MD   15 mg at 01/04/23 2134   ziprasidone (GEODON) injection 20 mg  20 mg Intramuscular Q12H PRN Jessenya Berdan, [name], MD   20 mg at 07/08/22 1453    Lab Results: No results found for this or any previous visit (from the past 48 hour(s)).  Blood Alcohol level:  Lab Results  Component Value Date   ETH <10 17/61/6073    Metabolic Disorder Labs: Lab Results  Component Value Date   HGBA1C 5.5 04/10/2022   MPG 111.15 04/10/2022   No results found for: "PROLACTIN" Lab Results  Component Value Date   CHOL 167 11/20/2021   TRIG 107 11/20/2021   HDL 26 (L) 11/20/2021   CHOLHDL 6.4 11/20/2021   VLDL 21 11/20/2021   LDLCALC 120 (H) 11/20/2021    Physical Findings: AIMS: Facial and Oral Movements Muscles of Facial Expression: None, normal Lips and Perioral Area: None, normal Jaw: None, normal Tongue: None, normal,Extremity Movements Upper (arms, wrists, hands, fingers): None, normal Lower (legs, knees, ankles, toes): None, normal, Trunk Movements Neck,  shoulders, hips: None, normal, Overall Severity Severity of abnormal movements (highest score from questions above): None, normal Incapacitation due to abnormal movements: None, normal  Patient's awareness of abnormal movements (rate only patient's report): No Awareness, Dental Status Current problems with teeth and/or dentures?: No Does patient usually wear dentures?: No  CIWA:    COWS:     Musculoskeletal: Strength & Muscle Tone: within normal limits Gait & Station: normal Patient leans: N/A  Psychiatric Specialty Exam:  Presentation  General Appearance:  Appropriate for Environment; Casual; Neat; Well Groomed  Eye Contact: Fair  Speech: Slow  Speech Volume: Decreased  Handedness: Left   Mood and Affect  Mood: Anxious  Affect: Congruent   Thought Process  Thought Processes: Goal Directed  Descriptions of Associations:Circumstantial  Orientation:Partial  Thought Content:Scattered  History of Schizophrenia/Schizoaffective disorder:Yes  Duration of Psychotic Symptoms:Greater than six months  Hallucinations:No data recorded Ideas of Reference:None  Suicidal Thoughts:No data recorded Homicidal Thoughts:No data recorded  Sensorium  Memory: Remote Good  Judgment: Fair  Insight: Fair   Community education officer  Concentration: Fair  Attention Span: Fair  Recall: [employer] of Knowledge: Fair  Language: Fair   Psychomotor Activity  Psychomotor Activity:No data recorded  Assets  Assets:No data recorded  Sleep  Sleep:No data recorded   Physical Exam: Physical Exam Vitals and nursing note reviewed.  Constitutional:      Appearance: Normal appearance.  HENT:     Head: Normocephalic and atraumatic.     Mouth/Throat:     Pharynx: Oropharynx is clear.  Eyes:     Pupils: Pupils are equal, round, and reactive to light.  Cardiovascular:     Rate and Rhythm: Normal rate and regular rhythm.  Pulmonary:     Effort: Pulmonary effort  is normal.     Breath sounds: Normal breath sounds.  Abdominal:     General: Abdomen is flat.     Palpations: Abdomen is soft.  Musculoskeletal:        General: Normal range of motion.  Skin:    General: Skin is warm and dry.  Neurological:     General: No focal deficit present.     Mental Status: He is alert. Mental status is at baseline.  Psychiatric:        Attention and Perception: He is inattentive.        Mood and Affect: Mood normal. Affect is blunt.        Speech: He is noncommunicative.    Review of Systems  Unable to perform ROS: Language   Blood pressure 122/83, pulse 85, temperature 98.9 F (37.2 C), temperature source Oral, resp. rate 18, height 5\' 8"  (1.727 m), weight 85.8 kg, SpO2 100 %. Body mass index is 28.76 kg/m.   Treatment Plan Summary: Medication management and Plan completely stable.  No change to behavior.  No new symptoms.  No change to medication continue to focus on discharge  Alethia Berthold, MD 01/05/2023, 10:33 AM

## 2023-01-05 NOTE — Progress Notes (Signed)
Patient came up to get Tylenol for right lower leg pain and went back to sleep. Patient stated that he didn't want to eat breakfast, when this writer asked him. This writer will administer scheduled morning medication once he wakes up. MD will be notified during progression rounds.

## 2023-01-06 NOTE — Progress Notes (Signed)
[facility] MD Progress Note  01/06/2023 1:39 PM Adam [name]  MRN:  [id number] Subjective: No complaints.  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[place])   Stroke (HCC)   HTN (hypertension)   HLD (hyperlipidemia)   CAD (coronary artery disease)   Chronic systolic CHF (congestive heart failure) (HCC)   Anxiety   Infection of left earlobe  Total Time spent with patient: 15 minutes  Past Psychiatric History: Past history of schizophrenia and stroke  Past Medical History:  Past Medical History:  Diagnosis Date   Myocardial infarction ([place])    Schizophrenia ([place])    Stroke ([place])     Past Surgical History:  Procedure Laterality Date   NO PAST SURGERIES     Family History:  Family History  Problem Relation Age of Onset   Hypertension Mother    Family Psychiatric  History: See previous Social History:  Social History   Substance and Sexual Activity  Alcohol Use Not Currently     Social History   Substance and Sexual Activity  Drug Use Not Currently    Social History   Socioeconomic History   Marital status: Single    Spouse name: Not on file   Number of children: Not on file   Years of education: Not on file   Highest education level: Not on file  Occupational History   Not on file  Tobacco Use   Smoking status: Never    Passive exposure: Never   Smokeless tobacco: Never  Vaping Use   Vaping Use: Unknown  Substance and Sexual Activity   Alcohol use: Not Currently   Drug use: Not Currently   Sexual activity: Not Currently  Other Topics Concern   Not on file  Social History Narrative   Not on file   Social Determinants of Health   Financial Resource Strain: Not on file  Food  Insecurity: Not on file  Transportation Needs: Not on file  Physical Activity: Not on file  Stress: Not on file  Social Connections: Not on file   Additional Social History:  Specify valuables returned:  (none)                      Sleep: Fair  Appetite:  Fair  Current Medications: Current Facility-Administered Medications  Medication Dose Route Frequency Provider Last Rate Last Admin   acetaminophen (TYLENOL) tablet 650 mg  650 mg Oral Q6H PRN Sherryann Frese T, MD   650 mg at 01/05/23 0754   alum & mag hydroxide-simeth (MAALOX/MYLANTA) 200-200-20 MG/5ML suspension 30 mL  30 mL Oral Q4H PRN Brenleigh Collet T, MD   30 mL at 04/26/22 7096   aspirin EC tablet 81 mg  81 mg Oral Daily Lyrica Mcclarty, [name], MD   81 mg at 01/06/23 0934   atorvastatin (LIPITOR) tablet 20 mg  20 mg Oral Daily Joyanne Eddinger T, MD   20 mg at 01/06/23 0934   atropine 1 % ophthalmic solution 1 drop  1 drop Sublingual QID [name], MD   1 drop at 01/06/23 1213   benztropine (COGENTIN) tablet 0.5 mg  0.5 mg Oral BID Valerie Fredin T, MD   0.5 mg at 01/06/23 0933   clonazePAM (KLONOPIN) tablet 1 mg  1 mg Oral TID PRN He, [name], MD  1 mg at 12/31/22 1414   cloZAPine (CLOZARIL) tablet 300 mg  300 mg Oral QHS Shandra Szymborski T, MD   300 mg at 01/05/23 2127   diphenhydrAMINE (BENADRYL) capsule 50 mg  50 mg Oral Q6H PRN Conrad Zajkowski T, MD   50 mg at 12/31/22 1414   Or   diphenhydrAMINE (BENADRYL) injection 50 mg  50 mg Intramuscular Q6H PRN Talyn Dessert T, MD   50 mg at 01/01/23 2252   feeding supplement (ENSURE ENLIVE / ENSURE PLUS) liquid 237 mL  237 mL Oral BID BM Aizik Reh T, MD   237 mL at 01/06/23 0934   gabapentin (NEURONTIN) capsule 300 mg  300 mg Oral TID Jafari Mckillop T, MD   300 mg at 01/06/23 3267   haloperidol (HALDOL) tablet 2 mg  2 mg Oral TID Jannatul Wojdyla, [name], MD   2 mg at 01/06/23 1245   haloperidol (HALDOL) tablet 5 mg  5 mg Oral Q6H PRN Breeze Berringer, [name], MD   5 mg at 12/14/22 1709   Or    haloperidol lactate (HALDOL) injection 5 mg  5 mg Intramuscular Q6H PRN Stefan Karen T, MD   5 mg at 01/01/23 2252   lisinopril (ZESTRIL) tablet 5 mg  5 mg Oral Daily Seerat Peaden T, MD   5 mg at 01/06/23 0933   magnesium hydroxide (MILK OF MAGNESIA) suspension 30 mL  30 mL Oral Daily PRN Kealey Kemmer T, MD   30 mL at 12/03/22 1526   metoprolol succinate (TOPROL-XL) 24 hr tablet 25 mg  25 mg Oral Daily Louiza Moor T, MD   25 mg at 01/06/23 0933   ondansetron (ZOFRAN) tablet 4 mg  4 mg Oral Q8H PRN [name], NP   4 mg at 09/06/22 2320   paliperidone (INVEGA SUSTENNA) injection 156 mg  156 mg Intramuscular Q28 days Derrious Bologna T, MD   156 mg at 12/18/22 1654   paliperidone (INVEGA) 24 hr tablet 6 mg  6 mg Oral QHS Esmee Fallaw T, MD   6 mg at 01/05/23 2126   senna-docusate (Senokot-S) tablet 2 tablet  2 tablet Oral QHS PRN [name], DO   2 tablet at 09/15/22 2110   temazepam (RESTORIL) capsule 15 mg  15 mg Oral QHS Jackquline Branca T, MD   15 mg at 01/05/23 2126   ziprasidone (GEODON) injection 20 mg  20 mg Intramuscular Q12H PRN Nunzio Banet, [name], MD   20 mg at 07/08/22 1453    Lab Results: No results found for this or any previous visit (from the past 48 hour(s)).  Blood Alcohol level:  Lab Results  Component Value Date   ETH <10 80/99/8338    Metabolic Disorder Labs: Lab Results  Component Value Date   HGBA1C 5.5 04/10/2022   MPG 111.15 04/10/2022   No results found for: "PROLACTIN" Lab Results  Component Value Date   CHOL 167 11/20/2021   TRIG 107 11/20/2021   HDL 26 (L) 11/20/2021   CHOLHDL 6.4 11/20/2021   VLDL 21 11/20/2021   LDLCALC 120 (H) 11/20/2021    Physical Findings: AIMS: Facial and Oral Movements Muscles of Facial Expression: None, normal Lips and Perioral Area: None, normal Jaw: None, normal Tongue: None, normal,Extremity Movements Upper (arms, wrists, hands, fingers): None, normal Lower (legs, knees, ankles, toes): None, normal,  Trunk Movements Neck, shoulders, hips: None, normal, Overall Severity Severity of abnormal movements (highest score from questions above): None, normal Incapacitation due to abnormal movements: None,  normal Patient's awareness of abnormal movements (rate only patient's report): No Awareness, Dental Status Current problems with teeth and/or dentures?: No Does patient usually wear dentures?: No  CIWA:    COWS:     Musculoskeletal: Strength & Muscle Tone: within normal limits Gait & Station: normal Patient leans: N/A  Psychiatric Specialty Exam:  Presentation  General Appearance:  Appropriate for Environment; Casual; Neat; Well Groomed  Eye Contact: Fair  Speech: Slow  Speech Volume: Decreased  Handedness: Left   Mood and Affect  Mood: Anxious  Affect: Congruent   Thought Process  Thought Processes: Goal Directed  Descriptions of Associations:Circumstantial  Orientation:Partial  Thought Content:Scattered  History of Schizophrenia/Schizoaffective disorder:Yes  Duration of Psychotic Symptoms:Greater than six months  Hallucinations:No data recorded Ideas of Reference:None  Suicidal Thoughts:No data recorded Homicidal Thoughts:No data recorded  Sensorium  Memory: Remote Good  Judgment: Fair  Insight: Fair   Community education officer  Concentration: Fair  Attention Span: Fair  Recall: [employer] of Knowledge: Fair  Language: Fair   Psychomotor Activity  Psychomotor Activity:No data recorded  Assets  Assets:No data recorded  Sleep  Sleep:No data recorded   Physical Exam: Physical Exam Vitals and nursing note reviewed.  Constitutional:      Appearance: Normal appearance.  HENT:     Head: Normocephalic and atraumatic.     Mouth/Throat:     Pharynx: Oropharynx is clear.  Eyes:     Pupils: Pupils are equal, round, and reactive to light.  Cardiovascular:     Rate and Rhythm: Normal rate and regular rhythm.  Pulmonary:      Effort: Pulmonary effort is normal.     Breath sounds: Normal breath sounds.  Abdominal:     General: Abdomen is flat.     Palpations: Abdomen is soft.  Musculoskeletal:        General: Normal range of motion.  Skin:    General: Skin is warm and dry.  Neurological:     General: No focal deficit present.     Mental Status: He is alert. Mental status is at baseline.  Psychiatric:        Attention and Perception: He is inattentive.        Mood and Affect: Mood normal. Affect is blunt.        Speech: He is noncommunicative.    Review of Systems  Unable to perform ROS: Language  Constitutional: Negative.   HENT: Negative.    Eyes: Negative.   Respiratory: Negative.    Cardiovascular: Negative.   Gastrointestinal: Negative.   Musculoskeletal: Negative.   Skin: Negative.   Neurological: Negative.    Blood pressure 101/82, pulse 98, temperature 98.9 F (37.2 C), temperature source Oral, resp. rate 18, height 5\' 8"  (1.727 m), weight 85.8 kg, SpO2 98 %. Body mass index is 28.76 kg/m.   Treatment Plan Summary: Medication management and Plan no change to medication management.  Encouragement and pleasant communication with the patient today.  Everything stable looking for discharge.  Alethia Berthold, MD 01/06/2023, 1:39 PM

## 2023-01-06 NOTE — BHH Group Notes (Signed)
[place] Group Notes:  (Nursing/MHT/Case Management/Adjunct)  Date:  01/06/2023  Time:  9:48 AM  Type of Therapy:   community meeting  Participation Level:  Did Not Attend    Antonieta Pert 01/06/2023, 9:48 AM

## 2023-01-06 NOTE — Group Note (Signed)
LCSW Group Therapy Note  Group Date: 01/06/2023 Start Time: 1300 End Time: 1400   Type of Therapy and Topic:  Group Therapy: Self-Harm Alternatives  Participation Level:  Did Not Attend   Description of Group:   Patients participated in a discussion regarding non-suicidal self-injurious behavior (NSSIB, or self-harm) and the stigma surrounding it. There was also discussion surrounding how other maladaptive coping skills could be seen as self-harm, such as substance abuse. Participants were invited to share their experiences with self-harm, with emphasis being placed on the motivation for self-harm (such as release, punishment, feeling numb, etc). Patients were then asked to brainstorm potential substitutions for self-harm and were provided with a handout entitled, "Distraction Techniques and Alternative Coping Strategies," published by [employer] Self-Injury Recovery.  Therapeutic Goals:  Patients will be given the opportunity to discuss NSSIB in a non-judgmental and therapeutic environment. Patients will identify which feelings lead to NSSIB.  Patients will discuss potential healthy coping skills to replace NSSIB Open discussion will specifically address stigma and shame surrounding NSSIB.   Summary of Patient Progress:  Did not attend  Therapeutic Modalities:   Cognitive Behavioral Therapy   Murriel Holwerda M Madelena Maturin, LCSWA 01/06/2023  3:37 PM   

## 2023-01-06 NOTE — Plan of Care (Signed)
D: Patient alert and oriented. Patient denies pain. Patient denies anxiety and depression. Patient denies SI/HI/AVH.  Patient has been interactive with staff throughout shift.  A: Scheduled medications administered to patient, per MD orders. Support and encouragement provided to patient.  Q15 minute safety checks maintained.   R: Patient compliant with medication administration and treatment plan. No adverse drug reactions noted. Patient remains safe on the unit at this time. Problem: Education: Goal: Knowledge of General Education information will improve Description: Including pain rating scale, medication(s)/side effects and non-pharmacologic comfort measures Outcome: Progressing   Problem: Clinical Measurements: Goal: Ability to maintain clinical measurements within normal limits will improve Outcome: Progressing   Problem: Nutrition: Goal: Adequate nutrition will be maintained Outcome: Progressing   Problem: Education: Goal: Will be free of psychotic symptoms Outcome: Progressing

## 2023-01-06 NOTE — Progress Notes (Signed)
Condition unchanged, he denies SI/HI/AVH, no distress noted  will continue to monitor

## 2023-01-07 NOTE — BHH Group Notes (Signed)
[place] Group Notes:  (Nursing/MHT/Case Management/Adjunct)  Date:  01/07/2023  Time:  9:13 PM  Type of Therapy:  Group Therapy  Participation Level:  Minimal  Participation Quality:  Sharing  Affect:  {BHH AFFECT:22266}  Cognitive:  {BHH COGNITIVE:22267}  Insight:  {BHH Insight2:20797}  Engagement in Group:  {BHH ENGAGEMENT IN LKJZP:91505}  Modes of Intervention:  {BHH MODES OF INTERVENTION:22269}  Summary of Progress/Problems:  Adam [name] 01/07/2023, 9:13 PM

## 2023-01-07 NOTE — Group Note (Signed)
LCSW Group Therapy Note  Group Date: 01/07/2023 Start Time: 1300 End Time: 1400  Type of Therapy and Topic:  Group Therapy: Using "I" Statements  Participation Level:  None  Description of Group:  Patients were asked to provide details of some interpersonal conflicts they have experienced. Patients were then educated about "I" statements, communication which focuses on feelings or views of the speaker rather than what the other person is doing. T group members were asked to reflect on past conflicts and to provide specific examples for utilizing "I" statements.  Therapeutic Goals:  Patients will verbalize understanding of ineffective communication and effective communication. Patients will be able to empathize with whom they are having conflict. Patients will practice effective communication in the form of "I" statements.  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    Therapeutic Modalities:   Cognitive Behavioral Therapy Solution-Focused Therapy  [name], Marlinda Mike 01/07/2023  2:40 PM

## 2023-01-07 NOTE — Progress Notes (Signed)
Had been irritable and agitated since the start of the shift.  He was having a hard time trying to convey to staff that he wanted a picture of an [place] Icon. Once staff was able to figure out what he was wanting and printed out a picture he seemed to calm himself.  It appears he was aware that he had worked himself up because he was at the nurses station promptly at 9pm wanting his qhs medications.  He received his medications without incident.  Marquez denies having si/hi/avh. He denies depression and anxiety. Staff encouraged him to come to the nurses station with any needs that he may have. Keegen nodded and said "yes" being agreeable to this. Will continue to monitor with q 15 minute safety checsk.    C Butler-Nicholson, LPN

## 2023-01-07 NOTE — Plan of Care (Signed)
  Problem: Education: Goal: Ability to state activities that reduce stress will improve Outcome: Progressing   Problem: Coping: Goal: Ability to identify and develop effective coping behavior will improve Outcome: Progressing   Problem: Self-Concept: Goal: Ability to identify factors that promote anxiety will improve Outcome: Progressing Goal: Level of anxiety will decrease Outcome: Progressing Goal: Ability to modify response to factors that promote anxiety will improve Outcome: Progressing   Problem: Education: Goal: Knowledge of General Education information will improve Description: Including pain rating scale, medication(s)/side effects and non-pharmacologic comfort measures Outcome: Progressing   Problem: Health Behavior/Discharge Planning: Goal: Ability to manage health-related needs will improve Outcome: Progressing   Problem: Clinical Measurements: Goal: Ability to maintain clinical measurements within normal limits will improve Outcome: Progressing Goal: Will remain free from infection Outcome: Progressing Goal: Diagnostic test results will improve Outcome: Progressing Goal: Respiratory complications will improve Outcome: Progressing Goal: Cardiovascular complication will be avoided Outcome: Progressing   Problem: Activity: Goal: Risk for activity intolerance will decrease Outcome: Progressing   Problem: Nutrition: Goal: Adequate nutrition will be maintained Outcome: Progressing   Problem: Coping: Goal: Level of anxiety will decrease Outcome: Progressing   Problem: Pain Managment: Goal: General experience of comfort will improve Outcome: Progressing

## 2023-01-07 NOTE — Progress Notes (Signed)
Patient had a fairly good day. No aggressive behaviors noted. Appetite and energy level good. Compliant with medications. Support and encouragement given.

## 2023-01-07 NOTE — Progress Notes (Signed)
[facility] MD Progress Note  01/07/2023 12:51 PM Adam [name]  MRN:  [id number] Subjective: Follow-up 62 year old man with schizophrenia and history of stroke.  No change to behavior.  Seems to be having a good day today.  Not agitated or intrusive.  Smiling.  Taking care of himself fine.  No problem behavior. 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[place])   Stroke (HCC)   HTN (hypertension)   HLD (hyperlipidemia)   CAD (coronary artery disease)   Chronic systolic CHF (congestive heart failure) (HCC)   Anxiety   Infection of left earlobe  Total Time spent with patient: 30 minutes  Past Psychiatric History: Past history of schizophrenia and stroke  Past Medical History:  Past Medical History:  Diagnosis Date   Myocardial infarction ([place])    Schizophrenia ([place])    Stroke ([place])     Past Surgical History:  Procedure Laterality Date   NO PAST SURGERIES     Family History:  Family History  Problem Relation Age of Onset   Hypertension Mother    Family Psychiatric  History: See previous Social History:  Social History   Substance and Sexual Activity  Alcohol Use Not Currently     Social History   Substance and Sexual Activity  Drug Use Not Currently    Social History   Socioeconomic History   Marital status: Single    Spouse name: Not on file   Number of children: Not on file   Years of education: Not on file   Highest education level: Not on file  Occupational History   Not on file  Tobacco Use   Smoking status: Never    Passive exposure: Never   Smokeless tobacco: Never  Vaping Use   Vaping Use: Unknown  Substance and Sexual Activity   Alcohol use: Not Currently   Drug use: Not Currently   Sexual activity: Not Currently  Other Topics Concern    Not on file  Social History Narrative   Not on file   Social Determinants of Health   Financial Resource Strain: Not on file  Food Insecurity: Not on file  Transportation Needs: Not on file  Physical Activity: Not on file  Stress: Not on file  Social Connections: Not on file   Additional Social History:  Specify valuables returned:  (none)                      Sleep: Fair  Appetite:  Fair  Current Medications: Current Facility-Administered Medications  Medication Dose Route Frequency Provider Last Rate Last Admin   acetaminophen (TYLENOL) tablet 650 mg  650 mg Oral Q6H PRN Abdelrahman Nair T, MD   650 mg at 01/05/23 0754   alum & mag hydroxide-simeth (MAALOX/MYLANTA) 200-200-20 MG/5ML suspension 30 mL  30 mL Oral Q4H PRN Breeanna Galgano T, MD   30 mL at 04/26/22 7062   aspirin EC tablet 81 mg  81 mg Oral Daily Reis Pienta T, MD   81 mg at 01/07/23 3762   atorvastatin (LIPITOR) tablet 20 mg  20 mg Oral Daily Perle Brickhouse T, MD   20 mg at 01/07/23 8315   atropine 1 % ophthalmic solution 1 drop  1 drop Sublingual QID [name], MD   1 drop at 01/07/23 1215   benztropine (COGENTIN) tablet 0.5 mg  0.5 mg Oral BID  Simonne Boulos, [name], MD   0.5 mg at 01/07/23 0820   clonazePAM (KLONOPIN) tablet 1 mg  1 mg Oral TID PRN He, [name], MD   1 mg at 12/31/22 1414   cloZAPine (CLOZARIL) tablet 300 mg  300 mg Oral QHS Barb Shear T, MD   300 mg at 01/06/23 2104   diphenhydrAMINE (BENADRYL) capsule 50 mg  50 mg Oral Q6H PRN Aldrin Engelhard T, MD   50 mg at 12/31/22 1414   Or   diphenhydrAMINE (BENADRYL) injection 50 mg  50 mg Intramuscular Q6H PRN Ricardo Schubach T, MD   50 mg at 01/01/23 2252   feeding supplement (ENSURE ENLIVE / ENSURE PLUS) liquid 237 mL  237 mL Oral BID BM Renlee Floor T, MD   237 mL at 01/07/23 1191   gabapentin (NEURONTIN) capsule 300 mg  300 mg Oral TID Stanislav Gervase T, MD   300 mg at 01/07/23 4782   haloperidol (HALDOL) tablet 2 mg  2 mg Oral TID Kaspian Muccio T, MD   2  mg at 01/07/23 9562   haloperidol (HALDOL) tablet 5 mg  5 mg Oral Q6H PRN Adalie Mand, [name], MD   5 mg at 12/14/22 1709   Or   haloperidol lactate (HALDOL) injection 5 mg  5 mg Intramuscular Q6H PRN Sherlene Rickel T, MD   5 mg at 01/01/23 2252   lisinopril (ZESTRIL) tablet 5 mg  5 mg Oral Daily Mayur Duman T, MD   5 mg at 01/07/23 1308   magnesium hydroxide (MILK OF MAGNESIA) suspension 30 mL  30 mL Oral Daily PRN Ivan Lacher T, MD   30 mL at 12/03/22 1526   metoprolol succinate (TOPROL-XL) 24 hr tablet 25 mg  25 mg Oral Daily Alphonse Asbridge T, MD   25 mg at 01/07/23 0820   ondansetron (ZOFRAN) tablet 4 mg  4 mg Oral Q8H PRN [name], NP   4 mg at 09/06/22 2320   paliperidone (INVEGA SUSTENNA) injection 156 mg  156 mg Intramuscular Q28 days Karinna Beadles T, MD   156 mg at 12/18/22 1654   paliperidone (INVEGA) 24 hr tablet 6 mg  6 mg Oral QHS Alba Kriesel T, MD   6 mg at 01/06/23 2104   senna-docusate (Senokot-S) tablet 2 tablet  2 tablet Oral QHS PRN [name], DO   2 tablet at 09/15/22 2110   temazepam (RESTORIL) capsule 15 mg  15 mg Oral QHS Mariam Helbert T, MD   15 mg at 01/06/23 2103   ziprasidone (GEODON) injection 20 mg  20 mg Intramuscular Q12H PRN Dvante Hands, [name], MD   20 mg at 07/08/22 1453    Lab Results: No results found for this or any previous visit (from the past 97 hour(s)).  Blood Alcohol level:  Lab Results  Component Value Date   ETH <10 65/78/4696    Metabolic Disorder Labs: Lab Results  Component Value Date   HGBA1C 5.5 04/10/2022   MPG 111.15 04/10/2022   No results found for: "PROLACTIN" Lab Results  Component Value Date   CHOL 167 11/20/2021   TRIG 107 11/20/2021   HDL 26 (L) 11/20/2021   CHOLHDL 6.4 11/20/2021   VLDL 21 11/20/2021   LDLCALC 120 (H) 11/20/2021    Physical Findings: AIMS: Facial and Oral Movements Muscles of Facial Expression: None, normal Lips and Perioral Area: None, normal Jaw: None, normal Tongue: None,  normal,Extremity Movements Upper (arms, wrists, hands, fingers): None, normal Lower (legs, knees, ankles,  toes): None, normal, Trunk Movements Neck, shoulders, hips: None, normal, Overall Severity Severity of abnormal movements (highest score from questions above): None, normal Incapacitation due to abnormal movements: None, normal Patient's awareness of abnormal movements (rate only patient's report): No Awareness, Dental Status Current problems with teeth and/or dentures?: No Does patient usually wear dentures?: No  CIWA:    COWS:     Musculoskeletal: Strength & Muscle Tone: within normal limits Gait & Station: normal Patient leans: N/A  Psychiatric Specialty Exam:  Presentation  General Appearance:  Appropriate for Environment; Casual; Neat; Well Groomed  Eye Contact: Fair  Speech: Slow  Speech Volume: Decreased  Handedness: Left   Mood and Affect  Mood: Anxious  Affect: Congruent   Thought Process  Thought Processes: Goal Directed  Descriptions of Associations:Circumstantial  Orientation:Partial  Thought Content:Scattered  History of Schizophrenia/Schizoaffective disorder:Yes  Duration of Psychotic Symptoms:Greater than six months  Hallucinations:No data recorded Ideas of Reference:None  Suicidal Thoughts:No data recorded Homicidal Thoughts:No data recorded  Sensorium  Memory: Remote Good  Judgment: Fair  Insight: Fair   Community education officer  Concentration: Fair  Attention Span: Fair  Recall: [employer] of Knowledge: Fair  Language: Fair   Psychomotor Activity  Psychomotor Activity:No data recorded  Assets  Assets:No data recorded  Sleep  Sleep:No data recorded   Physical Exam: Physical Exam Vitals and nursing note reviewed.  Constitutional:      Appearance: Normal appearance.  HENT:     Head: Normocephalic and atraumatic.     Mouth/Throat:     Pharynx: Oropharynx is clear.  Eyes:     Pupils: Pupils are  equal, round, and reactive to light.  Cardiovascular:     Rate and Rhythm: Normal rate and regular rhythm.  Pulmonary:     Effort: Pulmonary effort is normal.     Breath sounds: Normal breath sounds.  Abdominal:     General: Abdomen is flat.     Palpations: Abdomen is soft.  Musculoskeletal:        General: Normal range of motion.  Skin:    General: Skin is warm and dry.  Neurological:     General: No focal deficit present.     Mental Status: He is alert. Mental status is at baseline.  Psychiatric:        Attention and Perception: He is inattentive.        Mood and Affect: Mood normal. Affect is blunt.        Speech: He is noncommunicative.    Review of Systems  Unable to perform ROS: Language   Blood pressure 101/82, pulse 98, temperature 98.9 F (37.2 C), temperature source Oral, resp. rate 18, height 5\' 8"  (1.727 m), weight 85.8 kg, SpO2 98 %. Body mass index is 28.76 kg/m.   Treatment Plan Summary: Medication management and Plan primary issue continues to be prate placement.  He has had several interviews recently with possible placement facilities none of whom have been willing to take him.  No change to overall treatment  Alethia Berthold, MD 01/07/2023, 12:51 PM

## 2023-01-08 LAB — CBC WITH DIFFERENTIAL/PLATELET
Abs Immature Granulocytes: 0.01 10*3/uL (ref 0.00–0.07)
Basophils Absolute: 0.2 10*3/uL — ABNORMAL HIGH (ref 0.0–0.1)
Basophils Relative: 3 %
Eosinophils Absolute: 0 10*3/uL (ref 0.0–0.5)
Eosinophils Relative: 0 %
HCT: 40.7 % (ref 39.0–52.0)
Hemoglobin: 13.7 g/dL (ref 13.0–17.0)
Immature Granulocytes: 0 %
Lymphocytes Relative: 20 %
Lymphs Abs: 1.1 10*3/uL (ref 0.7–4.0)
MCH: 30.4 pg (ref 26.0–34.0)
MCHC: 33.7 g/dL (ref 30.0–36.0)
MCV: 90.4 fL (ref 80.0–100.0)
Monocytes Absolute: 0.6 10*3/uL (ref 0.1–1.0)
Monocytes Relative: 11 %
Neutro Abs: 3.5 10*3/uL (ref 1.7–7.7)
Neutrophils Relative %: 66 %
Platelets: 175 10*3/uL (ref 150–400)
RBC: 4.5 MIL/uL (ref 4.22–5.81)
RDW: 12.6 % (ref 11.5–15.5)
WBC: 5.3 10*3/uL (ref 4.0–10.5)
nRBC: 0 % (ref 0.0–0.2)

## 2023-01-08 NOTE — Group Note (Signed)
[facility] LCSW Group Therapy Note    Group Date: 01/08/2023 Start Time: 1400 End Time: 1500  Type of Therapy and Topic:  Group Therapy:  Overcoming Obstacles  Participation Level:  [facility] PARTICIPATION LEVEL: None   Description of Group:   In this group patients will be encouraged to explore what they see as obstacles to their own wellness and recovery. They will be guided to discuss their thoughts, feelings, and behaviors related to these obstacles. The group will process together ways to cope with barriers, with attention given to specific choices patients can make. Each patient will be challenged to identify changes they are motivated to make in order to overcome their obstacles. This group will be process-oriented, with patients participating in exploration of their own experiences as well as giving and receiving support and challenge from other group members.  Therapeutic Goals: 1. Patient will identify personal and current obstacles as they relate to admission. 2. Patient will identify barriers that currently interfere with their wellness or overcoming obstacles.  3. Patient will identify feelings, thought process and behaviors related to these barriers. 4. Patient will identify two changes they are willing to make to overcome these obstacles:    Summary of Patient Progress Patient was present for the majority of the group process. However, he laid his head on the desk most of the time that he was in the room.    Therapeutic Modalities:   Cognitive Behavioral Therapy Solution Focused Therapy Motivational Interviewing Relapse Prevention Therapy   Shirl Harris, LCSW

## 2023-01-08 NOTE — Progress Notes (Signed)
[facility] MD Progress Note  01/08/2023 1:21 PM Adam [name]  MRN:  [id number] Subjective: Follow-up patient with schizophrenia and history of stroke during long-term hospitalization.  Patient told me today that he is pregnant.  When he starts talking about this he always gets in a good mood.  Seems upbeat behavior is no different than usual. 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[place])   Stroke (HCC)   HTN (hypertension)   HLD (hyperlipidemia)   CAD (coronary artery disease)   Chronic systolic CHF (congestive heart failure) (HCC)   Anxiety   Infection of left earlobe  Total Time spent with patient: 30 minutes  Past Psychiatric History: Past history of schizophrenia chronic history of stroke affecting communication  Past Medical History:  Past Medical History:  Diagnosis Date   Myocardial infarction ([place])    Schizophrenia ([place])    Stroke ([place])     Past Surgical History:  Procedure Laterality Date   NO PAST SURGERIES     Family History:  Family History  Problem Relation Age of Onset   Hypertension Mother    Family Psychiatric  History: See previous Social History:  Social History   Substance and Sexual Activity  Alcohol Use Not Currently     Social History   Substance and Sexual Activity  Drug Use Not Currently    Social History   Socioeconomic History   Marital status: Single    Spouse name: Not on file   Number of children: Not on file   Years of education: Not on file   Highest education level: Not on file  Occupational History   Not on file  Tobacco Use   Smoking status: Never    Passive exposure: Never   Smokeless tobacco: Never  Vaping Use   Vaping Use: Unknown  Substance and Sexual Activity   Alcohol use: Not Currently   Drug use: Not  Currently   Sexual activity: Not Currently  Other Topics Concern   Not on file  Social History Narrative   Not on file   Social Determinants of Health   Financial Resource Strain: Not on file  Food Insecurity: Not on file  Transportation Needs: Not on file  Physical Activity: Not on file  Stress: Not on file  Social Connections: Not on file   Additional Social History:  Specify valuables returned:  (none)                      Sleep: Fair  Appetite:  Fair  Current Medications: Current Facility-Administered Medications  Medication Dose Route Frequency Provider Last Rate Last Admin   acetaminophen (TYLENOL) tablet 650 mg  650 mg Oral Q6H PRN Karandeep Resende T, MD   650 mg at 01/05/23 0754   alum & mag hydroxide-simeth (MAALOX/MYLANTA) 200-200-20 MG/5ML suspension 30 mL  30 mL Oral Q4H PRN Skip Litke T, MD   30 mL at 04/26/22 6010   aspirin EC tablet 81 mg  81 mg Oral Daily Abbey Veith T, MD   81 mg at 01/08/23 0824   atorvastatin (LIPITOR) tablet 20 mg  20 mg Oral Daily Kaylenn Civil T, MD   20 mg at 01/08/23 0824   atropine 1 % ophthalmic solution 1 drop  1 drop Sublingual QID [name], MD   1 drop at 01/08/23 1100   benztropine (COGENTIN) tablet  0.5 mg  0.5 mg Oral BID Lucindia Lemley T, MD   0.5 mg at 01/08/23 8416   clonazePAM (KLONOPIN) tablet 1 mg  1 mg Oral TID PRN He, [name], MD   1 mg at 12/31/22 1414   cloZAPine (CLOZARIL) tablet 300 mg  300 mg Oral QHS Zebastian Carico T, MD   300 mg at 01/07/23 2107   diphenhydrAMINE (BENADRYL) capsule 50 mg  50 mg Oral Q6H PRN Mouna Yager T, MD   50 mg at 12/31/22 1414   Or   diphenhydrAMINE (BENADRYL) injection 50 mg  50 mg Intramuscular Q6H PRN Berania Peedin T, MD   50 mg at 01/01/23 2252   feeding supplement (ENSURE ENLIVE / ENSURE PLUS) liquid 237 mL  237 mL Oral BID BM Sheryle Vice T, MD   237 mL at 01/08/23 1026   gabapentin (NEURONTIN) capsule 300 mg  300 mg Oral TID Kailee Essman T, MD   300 mg at 01/08/23 0900    haloperidol (HALDOL) tablet 2 mg  2 mg Oral TID Teresa Lemmerman T, MD   2 mg at 01/08/23 0900   haloperidol (HALDOL) tablet 5 mg  5 mg Oral Q6H PRN Dajohn Ellender, [name], MD   5 mg at 12/14/22 1709   Or   haloperidol lactate (HALDOL) injection 5 mg  5 mg Intramuscular Q6H PRN Braylon Grenda T, MD   5 mg at 01/01/23 2252   lisinopril (ZESTRIL) tablet 5 mg  5 mg Oral Daily Krystle Polcyn T, MD   5 mg at 01/08/23 6063   magnesium hydroxide (MILK OF MAGNESIA) suspension 30 mL  30 mL Oral Daily PRN French Kendra T, MD   30 mL at 12/03/22 1526   metoprolol succinate (TOPROL-XL) 24 hr tablet 25 mg  25 mg Oral Daily Evan Mackie T, MD   25 mg at 01/08/23 0824   ondansetron (ZOFRAN) tablet 4 mg  4 mg Oral Q8H PRN [name], NP   4 mg at 09/06/22 2320   paliperidone (INVEGA SUSTENNA) injection 156 mg  156 mg Intramuscular Q28 days Kenden Brandt T, MD   156 mg at 12/18/22 1654   paliperidone (INVEGA) 24 hr tablet 6 mg  6 mg Oral QHS Vershawn Westrup T, MD   6 mg at 01/07/23 2107   senna-docusate (Senokot-S) tablet 2 tablet  2 tablet Oral QHS PRN [name], DO   2 tablet at 09/15/22 2110   temazepam (RESTORIL) capsule 15 mg  15 mg Oral QHS Khaliya Golinski T, MD   15 mg at 01/07/23 2107   ziprasidone (GEODON) injection 20 mg  20 mg Intramuscular Q12H PRN Ludmila Ebarb, [name], MD   20 mg at 07/08/22 1453    Lab Results: No results found for this or any previous visit (from the past 9 hour(s)).  Blood Alcohol level:  Lab Results  Component Value Date   ETH <10 01/60/1093    Metabolic Disorder Labs: Lab Results  Component Value Date   HGBA1C 5.5 04/10/2022   MPG 111.15 04/10/2022   No results found for: "PROLACTIN" Lab Results  Component Value Date   CHOL 167 11/20/2021   TRIG 107 11/20/2021   HDL 26 (L) 11/20/2021   CHOLHDL 6.4 11/20/2021   VLDL 21 11/20/2021   LDLCALC 120 (H) 11/20/2021    Physical Findings: AIMS: Facial and Oral Movements Muscles of Facial Expression: None,  normal Lips and Perioral Area: None, normal Jaw: None, normal Tongue: None, normal,Extremity Movements Upper (arms, wrists, hands,  fingers): None, normal Lower (legs, knees, ankles, toes): None, normal, Trunk Movements Neck, shoulders, hips: None, normal, Overall Severity Severity of abnormal movements (highest score from questions above): None, normal Incapacitation due to abnormal movements: None, normal Patient's awareness of abnormal movements (rate only patient's report): No Awareness, Dental Status Current problems with teeth and/or dentures?: No Does patient usually wear dentures?: No  CIWA:    COWS:     Musculoskeletal: Strength & Muscle Tone: within normal limits Gait & Station: normal Patient leans: N/A  Psychiatric Specialty Exam:  Presentation  General Appearance:  Appropriate for Environment; Casual; Neat; Well Groomed  Eye Contact: Fair  Speech: Slow  Speech Volume: Decreased  Handedness: Left   Mood and Affect  Mood: Anxious  Affect: Congruent   Thought Process  Thought Processes: Goal Directed  Descriptions of Associations:Circumstantial  Orientation:Partial  Thought Content:Scattered  History of Schizophrenia/Schizoaffective disorder:Yes  Duration of Psychotic Symptoms:Greater than six months  Hallucinations:No data recorded Ideas of Reference:None  Suicidal Thoughts:No data recorded Homicidal Thoughts:No data recorded  Sensorium  Memory: Remote Good  Judgment: Fair  Insight: Fair   Community education officer  Concentration: Fair  Attention Span: Fair  Recall: [employer] of Knowledge: Fair  Language: Fair   Psychomotor Activity  Psychomotor Activity:No data recorded  Assets  Assets:No data recorded  Sleep  Sleep:No data recorded   Physical Exam: Physical Exam Vitals and nursing note reviewed.  Constitutional:      Appearance: Normal appearance.  HENT:     Head: Normocephalic and atraumatic.      Mouth/Throat:     Pharynx: Oropharynx is clear.  Eyes:     Pupils: Pupils are equal, round, and reactive to light.  Cardiovascular:     Rate and Rhythm: Normal rate and regular rhythm.  Pulmonary:     Effort: Pulmonary effort is normal.     Breath sounds: Normal breath sounds.  Abdominal:     General: Abdomen is flat.     Palpations: Abdomen is soft.  Musculoskeletal:        General: Normal range of motion.  Skin:    General: Skin is warm and dry.  Neurological:     General: No focal deficit present.     Mental Status: He is alert. Mental status is at baseline.  Psychiatric:        Attention and Perception: He is inattentive.        Mood and Affect: Mood normal. Affect is blunt.        Speech: He is noncommunicative. Speech is not delayed.    Review of Systems  Unable to perform ROS: Language   Blood pressure 110/78, pulse (!) 102, temperature 98.9 F (37.2 C), temperature source Oral, resp. rate 18, height 5\' 8"  (1.727 m), weight 85.8 kg, SpO2 99 %. Body mass index is 28.76 kg/m.   Treatment Plan Summary: Medication management and Plan no change to medication.  Positive encouragement and support daily but no change to overall treatment plan focusing on discharge.  Alethia Berthold, MD 01/08/2023, 1:21 PM

## 2023-01-08 NOTE — Plan of Care (Signed)
Patient behavior at baseline; generally calm and cooperative but irritable, agitated at times when he is not given his way or when frustrated by aphasia.  Pt is compliant with medications, denies Si HI AVH, denies any physical distress and behaves appropriately in most situations.  Continued monitoring via q 15 minute checks.    Problem: Self-Concept: Goal: Ability to identify factors that promote anxiety will improve Outcome: Progressing   Problem: Coping: Goal: Ability to identify and develop effective coping behavior will improve Outcome: Progressing   Problem: Self-Concept: Goal: Level of anxiety will decrease Outcome: Progressing

## 2023-01-08 NOTE — Progress Notes (Signed)
Patient loud this shift. Yelling up and down hallways and yelling while watching game. Was pleasant with staff and took all medications. Request to be shaved and pictures to be printed. Walking halls at times, minimal interaction with peers.  Encouragement and support provided. Safety checks maintained. Medications given as prescribed. Pt receptive and remains safe on unit with q 15 min checks.

## 2023-01-08 NOTE — Plan of Care (Signed)
  Problem: Coping: Goal: Level of anxiety will decrease Outcome: Progressing   Problem: Safety: Goal: Ability to remain free from injury will improve Outcome: Progressing   Problem: Coping: Goal: Coping ability will improve Outcome: Progressing   Problem: Education: Goal: Will be free of psychotic symptoms Outcome: Progressing

## 2023-01-08 NOTE — Plan of Care (Signed)
D- Patient alert and oriented to person, place, and situation. Patient is childlike and due to a past stroke, he has expressive aphasia. Patient endorsed depression, hopelessness and anxiety on his self-inventory, however, when this writer questioned patient, he denies all. Patient also denies SI, HI, AVH, and pain at this time. Patient has no stated goals for today, however, he was slightly fixated on a picture of Ozzy/Sharon Maxwell Caul and a [employer].   A- Scheduled medications administered to patient, per MD orders. Support and encouragement provided.  Routine safety checks conducted every 15 minutes.  Patient informed to notify staff with problems or concerns.  R- No adverse drug reactions noted. Patient contracts for safety at this time. Patient compliant with medications. Patient receptive, calm, and cooperative. Patient interacts well with others on the unit. Patient remains safe at this time.  Problem: Education: Goal: Ability to state activities that reduce stress will improve Outcome: Progressing   Problem: Coping: Goal: Ability to identify and develop effective coping behavior will improve Outcome: Progressing   Problem: Self-Concept: Goal: Ability to identify factors that promote anxiety will improve Outcome: Progressing Goal: Level of anxiety will decrease Outcome: Progressing Goal: Ability to modify response to factors that promote anxiety will improve Outcome: Progressing   Problem: Education: Goal: Knowledge of General Education information will improve Description: Including pain rating scale, medication(s)/side effects and non-pharmacologic comfort measures Outcome: Progressing   Problem: Health Behavior/Discharge Planning: Goal: Ability to manage health-related needs will improve Outcome: Progressing   Problem: Clinical Measurements: Goal: Ability to maintain clinical measurements within normal limits will improve Outcome: Progressing Goal: Will remain free  from infection Outcome: Progressing Goal: Diagnostic test results will improve Outcome: Progressing Goal: Respiratory complications will improve Outcome: Progressing Goal: Cardiovascular complication will be avoided Outcome: Progressing   Problem: Activity: Goal: Risk for activity intolerance will decrease Outcome: Progressing   Problem: Nutrition: Goal: Adequate nutrition will be maintained Outcome: Progressing   Problem: Coping: Goal: Level of anxiety will decrease Outcome: Progressing   Problem: Elimination: Goal: Will not experience complications related to bowel motility Outcome: Progressing Goal: Will not experience complications related to urinary retention Outcome: Progressing   Problem: Pain Managment: Goal: General experience of comfort will improve Outcome: Progressing   Problem: Safety: Goal: Ability to remain free from injury will improve Outcome: Progressing   Problem: Skin Integrity: Goal: Risk for impaired skin integrity will decrease Outcome: Progressing   Problem: Activity: Goal: Interest or engagement in leisure activities will improve Outcome: Progressing   Problem: Coping: Goal: Coping ability will improve Outcome: Progressing   Problem: Education: Goal: Will be free of psychotic symptoms Outcome: Progressing

## 2023-01-09 NOTE — Group Note (Signed)
[place] LCSW Group Therapy Note   Group Date: 01/09/2023 Start Time: 1310 End Time: 1400   Type of Therapy/Topic:  Group Therapy:  Emotion Regulation  Participation Level:  None   Mood:  Description of Group:    The purpose of this group is to assist patients in learning to regulate negative emotions and experience positive emotions. Patients will be guided to discuss ways in which they have been vulnerable to their negative emotions. These vulnerabilities will be juxtaposed with experiences of positive emotions or situations, and patients challenged to use positive emotions to combat negative ones. Special emphasis will be placed on coping with negative emotions in conflict situations, and patients will process healthy conflict resolution skills.  Therapeutic Goals: Patient will identify two positive emotions or experiences to reflect on in order to balance out negative emotions:  Patient will label two or more emotions that they find the most difficult to experience:  Patient will be able to demonstrate positive conflict resolution skills through discussion or role plays:   Summary of Patient Progress: Patient was in and out of group.  Patient was unable to engage in group.    Therapeutic Modalities:   Cognitive Behavioral Therapy Feelings Identification Dialectical Behavioral Therapy   Rozann Lesches, LCSW

## 2023-01-09 NOTE — Progress Notes (Signed)
[facility] MD Progress Note  01/09/2023 4:38 PM Adam [name]  MRN:  [id number] Subjective: Follow-up 62 year old man with schizophrenia.  No change to presentation.  No change to behavior.  Calm and no problems today. 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)   Stroke (HCC)   HTN (hypertension)   HLD (hyperlipidemia)   CAD (coronary artery disease)   Chronic systolic CHF (congestive heart failure) (HCC)   Anxiety   Infection of left earlobe  Total Time spent with patient: 45 minutes  Past Psychiatric History: Past history of schizophrenia  Past Medical History:  Past Medical History:  Diagnosis Date   Myocardial infarction (HCC)    Schizophrenia (HCC)    Stroke (HCC)     Past Surgical History:  Procedure Laterality Date   NO PAST SURGERIES     Family History:  Family History  Problem Relation Age of Onset   Hypertension Mother    Family Psychiatric  History: See previous Social History:  Social History   Substance and Sexual Activity  Alcohol Use Not Currently     Social History   Substance and Sexual Activity  Drug Use Not Currently    Social History   Socioeconomic History   Marital status: Single    Spouse name: Not on file   Number of children: Not on file   Years of education: Not on file   Highest education level: Not on file  Occupational History   Not on file  Tobacco Use   Smoking status: Never    Passive exposure: Never   Smokeless tobacco: Never  Vaping Use   Vaping Use: Unknown  Substance and Sexual Activity   Alcohol use: Not Currently   Drug use: Not Currently   Sexual activity: Not Currently  Other Topics Concern   Not on file  Social History Narrative   Not on file   Social Determinants of Health   Financial  Resource Strain: Not on file  Food Insecurity: Not on file  Transportation Needs: Not on file  Physical Activity: Not on file  Stress: Not on file  Social Connections: Not on file   Additional Social History:  Specify valuables returned:  (none)                      Sleep: Fair  Appetite:  Fair  Current Medications: Current Facility-Administered Medications  Medication Dose Route Frequency Provider Last Rate Last Admin   acetaminophen (TYLENOL) tablet 650 mg  650 mg Oral Q6H PRN Gatsby Chismar T, MD   650 mg at 01/05/23 0754   alum & mag hydroxide-simeth (MAALOX/MYLANTA) 200-200-20 MG/5ML suspension 30 mL  30 mL Oral Q4H PRN Kanton Kamel T, MD   30 mL at 04/26/22 0102   aspirin EC tablet 81 mg  81 mg Oral Daily Lysle Yero T, MD   81 mg at 01/09/23 0854   atorvastatin (LIPITOR) tablet 20 mg  20 mg Oral Daily Cay Kath T, MD   20 mg at 01/09/23 0854   atropine 1 % ophthalmic solution 1 drop  1 drop Sublingual QID [name], MD   1 drop at 01/09/23 1211   benztropine (COGENTIN) tablet 0.5 mg  0.5 mg Oral BID Sabien Umland T, MD   0.5 mg at 01/09/23 0854   clonazePAM (KLONOPIN) tablet 1 mg  1 mg  Oral TID PRN He, [name], MD   1 mg at 12/31/22 1414   cloZAPine (CLOZARIL) tablet 300 mg  300 mg Oral QHS Milena Liggett T, MD   300 mg at 01/08/23 2051   diphenhydrAMINE (BENADRYL) capsule 50 mg  50 mg Oral Q6H PRN Jerelle Virden, [name], MD   50 mg at 12/31/22 1414   Or   diphenhydrAMINE (BENADRYL) injection 50 mg  50 mg Intramuscular Q6H PRN Durant Scibilia, [name], MD   50 mg at 01/01/23 2252   feeding supplement (ENSURE ENLIVE / ENSURE PLUS) liquid 237 mL  237 mL Oral BID BM Sumeet Geter T, MD   237 mL at 01/09/23 1411   gabapentin (NEURONTIN) capsule 300 mg  300 mg Oral TID Daryus Sowash, [name], MD   300 mg at 01/09/23 1411   haloperidol (HALDOL) tablet 2 mg  2 mg Oral TID Yacine Garriga, [name], MD   2 mg at 01/09/23 1411   haloperidol (HALDOL) tablet 5 mg  5 mg Oral Q6H PRN Lexington Krotz, [name], MD   5 mg  at 12/14/22 1709   Or   haloperidol lactate (HALDOL) injection 5 mg  5 mg Intramuscular Q6H PRN Saiquan Hands, [name], MD   5 mg at 01/01/23 2252   lisinopril (ZESTRIL) tablet 5 mg  5 mg Oral Daily Sailor Haughn, [name], MD   5 mg at 01/09/23 0854   magnesium hydroxide (MILK OF MAGNESIA) suspension 30 mL  30 mL Oral Daily PRN Derrico Zhong, [name], MD   30 mL at 12/03/22 1526   metoprolol succinate (TOPROL-XL) 24 hr tablet 25 mg  25 mg Oral Daily Lenah Messenger T, MD   25 mg at 01/09/23 0854   ondansetron (ZOFRAN) tablet 4 mg  4 mg Oral Q8H PRN Deloria Lair, NP   4 mg at 09/06/22 2320   paliperidone (INVEGA SUSTENNA) injection 156 mg  156 mg Intramuscular Q28 days Shaquela Weichert, [name], MD   156 mg at 12/18/22 1654   paliperidone (INVEGA) 24 hr tablet 6 mg  6 mg Oral QHS Junaid Wurzer T, MD   6 mg at 01/08/23 2051   senna-docusate (Senokot-S) tablet 2 tablet  2 tablet Oral QHS PRN [name], DO   2 tablet at 09/15/22 2110   temazepam (RESTORIL) capsule 15 mg  15 mg Oral QHS Ronell Duffus T, MD   15 mg at 01/08/23 2051   ziprasidone (GEODON) injection 20 mg  20 mg Intramuscular Q12H PRN Johntay Doolen, [name], MD   20 mg at 07/08/22 1453    Lab Results:  Results for orders placed or performed during the hospital encounter of 03/19/22 (from the past 48 hour(s))  CBC with Differential/Platelet     Status: Abnormal   Collection Time: 01/08/23  3:00 PM  Result Value Ref Range   WBC 5.3 4.0 - 10.5 K/uL   RBC 4.50 4.22 - 5.81 MIL/uL   Hemoglobin 13.7 13.0 - 17.0 g/dL   HCT 40.7 39.0 - 52.0 %   MCV 90.4 80.0 - 100.0 fL   MCH 30.4 26.0 - 34.0 pg   MCHC 33.7 30.0 - 36.0 g/dL   RDW 12.6 11.5 - 15.5 %   Platelets 175 150 - 400 K/uL   nRBC 0.0 0.0 - 0.2 %   Neutrophils Relative % 66 %   Neutro Abs 3.5 1.7 - 7.7 K/uL   Lymphocytes Relative 20 %   Lymphs Abs 1.1 0.7 - 4.0 K/uL   Monocytes Relative 11 %  Monocytes Absolute 0.6 0.1 - 1.0 K/uL   Eosinophils Relative 0 %   Eosinophils Absolute 0.0 0.0 - 0.5 K/uL    Basophils Relative 3 %   Basophils Absolute 0.2 (H) 0.0 - 0.1 K/uL   Immature Granulocytes 0 %   Abs Immature Granulocytes 0.01 0.00 - 0.07 K/uL    Comment: Performed at [facility], [address] [place], [place] [address]    Blood Alcohol level:  Lab Results  Component Value Date   [facility] <10 53/66/4403    Metabolic Disorder Labs: Lab Results  Component Value Date   HGBA1C 5.5 04/10/2022   MPG 111.15 04/10/2022   No results found for: "PROLACTIN" Lab Results  Component Value Date   CHOL 167 11/20/2021   TRIG 107 11/20/2021   HDL 26 (L) 11/20/2021   CHOLHDL 6.4 11/20/2021   VLDL 21 11/20/2021   LDLCALC 120 (H) 11/20/2021    Physical Findings: AIMS: Facial and Oral Movements Muscles of Facial Expression: None, normal Lips and Perioral Area: None, normal Jaw: None, normal Tongue: None, normal,Extremity Movements Upper (arms, wrists, hands, fingers): None, normal Lower (legs, knees, ankles, toes): None, normal, Trunk Movements Neck, shoulders, hips: None, normal, Overall Severity Severity of abnormal movements (highest score from questions above): None, normal Incapacitation due to abnormal movements: None, normal Patient's awareness of abnormal movements (rate only patient's report): No Awareness, Dental Status Current problems with teeth and/or dentures?: No Does patient usually wear dentures?: No  CIWA:    COWS:     Musculoskeletal: Strength & Muscle Tone: within normal limits Gait & Station: normal Patient leans: N/A  Psychiatric Specialty Exam:  Presentation  General Appearance:  Appropriate for Environment; Casual; Neat; Well Groomed  Eye Contact: Fair  Speech: Slow  Speech Volume: Decreased  Handedness: Left   Mood and Affect  Mood: Anxious  Affect: Congruent   Thought Process  Thought Processes: Goal Directed  Descriptions of Associations:Circumstantial  Orientation:Partial  Thought Content:Scattered  History  of Schizophrenia/Schizoaffective disorder:Yes  Duration of Psychotic Symptoms:Greater than six months  Hallucinations:No data recorded Ideas of Reference:None  Suicidal Thoughts:No data recorded Homicidal Thoughts:No data recorded  Sensorium  Memory: Remote Good  Judgment: Fair  Insight: Fair   Community education officer  Concentration: Fair  Attention Span: Fair  Recall: [place] of Knowledge: Fair  Language: Fair   Psychomotor Activity  Psychomotor Activity:No data recorded  Assets  Assets:No data recorded  Sleep  Sleep:No data recorded   Physical Exam: Physical Exam Vitals reviewed.  Constitutional:      Appearance: Normal appearance.  HENT:     Head: Normocephalic and atraumatic.     Mouth/Throat:     Pharynx: Oropharynx is clear.  Eyes:     Pupils: Pupils are equal, round, and reactive to light.  Cardiovascular:     Rate and Rhythm: Normal rate and regular rhythm.  Pulmonary:     Effort: Pulmonary effort is normal.     Breath sounds: Normal breath sounds.  Abdominal:     General: Abdomen is flat.     Palpations: Abdomen is soft.  Musculoskeletal:        General: Normal range of motion.  Skin:    General: Skin is warm and dry.  Neurological:     General: No focal deficit present.     Mental Status: He is alert. Mental status is at baseline.  Psychiatric:        Attention and Perception: He is inattentive.  Mood and Affect: Affect is blunt.        Speech: He is noncommunicative.    Review of Systems  Unable to perform ROS: Language   Blood pressure 122/88, pulse 100, temperature 98.9 F (37.2 C), temperature source Oral, resp. rate 18, height 5\' 8"  (1.727 m), weight 85.8 kg, SpO2 100 %. Body mass index is 28.76 kg/m.   Treatment Plan Summary: Medication management and Plan no change to medication.  No change to treatment plan.  Still focused on discharge.  Alethia Berthold, MD 01/09/2023, 4:38 PM
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
## 2023-01-09 NOTE — Plan of Care (Signed)
Patient loud and yelling up and down in the hallway and room. Patient at the nurses station multiple times for arm band and changing name.Patient compliant with medications. Appetite and energy level good. Support and encouragement given.

## 2023-01-09 NOTE — B[facility] Counselor (Signed)
CSW team Myrtha Mantis., Silas [place] F.) met with [facility] care coordinators Delle Reining., Rhett Bannister., and Penny Pia.) and [facility] Guinevere Ferrari. And Zackary B.) to discuss pt care. Mark C. From [facility] was also on the call.   [facility] was updated that contact with [facility] had been unable to be established today. They were informed that we would continue to reach out to the owner, Mr. Stann Mainland.   Elta Guadeloupe stated that he would like a list of the places that have been contacted. Mendel Ryder reviewed the places which had been contacted and stated that these would be sent to [facility].   CSW team was informed that [facility] would like for pt to have a PT/OT consult to show potential placements that he can address his own ADLs and for any potential physical limitations. [facility] also stated that they need an updated FL2, CCA/H&P, and a new summary of pt's behaviors over the past month. CSW team was told that any information older than 45 days should not be sent anymore. CSW advised that updated notes had been sent and the only documentation that was not current was the H&P, which was completed upon admission.  No other concerns expressed. Contact ended without incident.   Provider was updated regarding requests for information (summary of behavior) and PT/OT consults. Provider also agreed to provide summary of behavior and put in orders for the consults.  [name]. Guerry Bruin, MSW, [place], [place] 01/09/2023 2:47 PM
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
## 2023-01-09 NOTE — Progress Notes (Signed)
Patient calm and pleasant during assessment. Pt denies SI/HI/AVH. Pt observed interacting appropriately with staff and peers on the unit. Pt compliant with medication administration per MD orders. Pt refused his scheduled medication tonight, pt give education, still refused. Pt being monitored Q 15 minutes for safety per unit protocol, remains safe on the unit    

## 2023-01-10 NOTE — Progress Notes (Signed)
D- Patient alert and oriented x2-3. Affect animated/mood labile. Denies SI/ HI/AVH. He c/o right leg pain 5/10. Tylenol 650mg  PRN effective. A- Scheduled medications administered to patient, per MD orders. Support and encouragement provided.  Routine safety checks conducted every 15 minutes. Patient informed to notify staff with problems or concerns. R- No adverse drug reactions noted. Patient agitated late this shift related calling his mother but was diverted to watch TV and effective. Patient compliant with medications and treatment plan. Patient receptive and cooperative at present. Patient contracts for safety and remains safe on the unit at this time.

## 2023-01-10 NOTE — [facility] Group Notes (Signed)
[place] Group Notes:  (Nursing/MHT/Case Management/Adjunct)  Date:  01/10/2023  Time:  8:40 PM  Type of Therapy:   Wrap up  Participation Level:  Did Not Attend   Summary of Progress/Problems:  Adam [name] 01/10/2023, 8:40 PM

## 2023-01-10 NOTE — Group Note (Signed)
LCSW Group Therapy Note  Group Date: 01/10/2023 Start Time: 1300 End Time: 1400   Type of Therapy and Topic:  Group Therapy - Healthy vs Unhealthy Coping Skills  Participation Level:  None   Description of Group The focus of this group was to determine what unhealthy coping techniques typically are used by group members and what healthy coping techniques would be helpful in coping with various problems. Patients were guided in becoming aware of the differences between healthy and unhealthy coping techniques. Patients were asked to identify 2-3 healthy coping skills they would like to learn to use more effectively.  Therapeutic Goals Patients learned that coping is what human beings do all day long to deal with various situations in their lives Patients defined and discussed healthy vs unhealthy coping techniques Patients identified their preferred coping techniques and identified whether these were healthy or unhealthy Patients determined 2-3 healthy coping skills they would like to become more familiar with and use more often. Patients provided support and ideas to each other   Summary of Patient Progress:  Patient present for entirety of group, unable to participate in group due to latent effects of stroke.   Therapeutic Modalities Cognitive Behavioral Therapy Motivational Interviewing  [name] 01/10/2023  3:46 PM

## 2023-01-10 NOTE — Progress Notes (Signed)
[facility] MD Progress Note  01/10/2023 11:28 AM Adam [name]  MRN:  [id number] Subjective: Follow-up this patient with schizophrenia.  No change at all in his presentation or behavior.  Continues to be no trouble on the unit.  Compliant with medicine.  No physical complaints. 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  Past Medical History:  Past Medical History:  Diagnosis Date   Myocardial infarction ([place])    Schizophrenia ([place])    Stroke [facility])     Past Surgical History:  Procedure Laterality Date   NO PAST SURGERIES     Family History:  Family History  Problem Relation Age of Onset   Hypertension Mother    Family Psychiatric  History: See previous Social History:  Social History   Substance and Sexual Activity  Alcohol Use Not Currently     Social History   Substance and Sexual Activity  Drug Use Not Currently    Social History   Socioeconomic History   Marital status: Single    Spouse name: Not on file   Number of children: Not on file   Years of education: Not on file   Highest education level: Not on file  Occupational History   Not on file  Tobacco Use   Smoking status: Never    Passive exposure: Never   Smokeless tobacco: Never  Vaping Use   Vaping Use: Unknown  Substance and Sexual Activity   Alcohol use: Not Currently   Drug use: Not Currently   Sexual activity: Not Currently  Other Topics Concern   Not on file  Social History Narrative   Not on  file   Social Determinants of Health   Financial Resource Strain: Not on file  Food Insecurity: Not on file  Transportation Needs: Not on file  Physical Activity: Not on file  Stress: Not on file  Social Connections: Not on file   Additional Social History:  Specify valuables returned:  (none)                      Sleep: Fair  Appetite:  Fair  Current Medications: Current Facility-Administered Medications  Medication Dose Route Frequency Provider Last Rate Last Admin   acetaminophen (TYLENOL) tablet 650 mg  650 mg Oral Q6H PRN Catarino Vold T, MD   650 mg at 01/10/23 1011   alum & mag hydroxide-simeth (MAALOX/MYLANTA) 200-200-20 MG/5ML suspension 30 mL  30 mL Oral Q4H PRN Tyrina Hines T, MD   30 mL at 04/26/22 9417   aspirin EC tablet 81 mg  81 mg Oral Daily Khiley Lieser T, MD   81 mg at 01/10/23 0835   atorvastatin (LIPITOR) tablet 20 mg  20 mg Oral Daily Faith Patricelli T, MD   20 mg at 01/10/23 0835   atropine 1 % ophthalmic solution 1 drop  1 drop Sublingual QID [name], MD   1 drop at 01/10/23 0936   benztropine (COGENTIN) tablet 0.5 mg  0.5 mg Oral BID Dessa Ledee, [name], MD   0.5 mg at 01/10/23 740 125 2178  clonazePAM (KLONOPIN) tablet 1 mg  1 mg Oral TID PRN He, [name], MD   1 mg at 12/31/22 1414   cloZAPine (CLOZARIL) tablet 300 mg  300 mg Oral QHS Bettylou Frew T, MD   300 mg at 01/09/23 2114   diphenhydrAMINE (BENADRYL) capsule 50 mg  50 mg Oral Q6H PRN Richel Millspaugh, [name], MD   50 mg at 12/31/22 1414   Or   diphenhydrAMINE (BENADRYL) injection 50 mg  50 mg Intramuscular Q6H PRN Brilyn Tuller, [name], MD   50 mg at 01/01/23 2252   feeding supplement (ENSURE ENLIVE / ENSURE PLUS) liquid 237 mL  237 mL Oral BID BM Lilley Hubble T, MD   237 mL at 01/10/23 0837   gabapentin (NEURONTIN) capsule 300 mg  300 mg Oral TID Mariaguadalupe Fialkowski, [name], MD   300 mg at 01/10/23 1950   haloperidol (HALDOL) tablet 2 mg  2 mg Oral TID Ashara Lounsbury, [name], MD   2 mg at 01/10/23 9326   haloperidol (HALDOL)  tablet 5 mg  5 mg Oral Q6H PRN Keiondre Colee, [name], MD   5 mg at 12/14/22 1709   Or   haloperidol lactate (HALDOL) injection 5 mg  5 mg Intramuscular Q6H PRN Simrin Vegh, [name], MD   5 mg at 01/01/23 2252   lisinopril (ZESTRIL) tablet 5 mg  5 mg Oral Daily Romi Rathel, [name], MD   5 mg at 01/10/23 7124   magnesium hydroxide (MILK OF MAGNESIA) suspension 30 mL  30 mL Oral Daily PRN Neng Albee, [name], MD   30 mL at 12/03/22 1526   metoprolol succinate (TOPROL-XL) 24 hr tablet 25 mg  25 mg Oral Daily Demira Gwynne T, MD   25 mg at 01/10/23 0835   ondansetron (ZOFRAN) tablet 4 mg  4 mg Oral Q8H PRN Deloria Lair, NP   4 mg at 09/06/22 2320   paliperidone (INVEGA SUSTENNA) injection 156 mg  156 mg Intramuscular Q28 days Fitz Matsuo, [name], MD   156 mg at 12/18/22 1654   paliperidone (INVEGA) 24 hr tablet 6 mg  6 mg Oral QHS Viliami Bracco T, MD   6 mg at 01/09/23 2114   senna-docusate (Senokot-S) tablet 2 tablet  2 tablet Oral QHS PRN [name], DO   2 tablet at 09/15/22 2110   temazepam (RESTORIL) capsule 15 mg  15 mg Oral QHS Renard Caperton T, MD   15 mg at 01/09/23 2114   ziprasidone (GEODON) injection 20 mg  20 mg Intramuscular Q12H PRN Dayna Alia, [name], MD   20 mg at 07/08/22 1453    Lab Results:  Results for orders placed or performed during the hospital encounter of 03/19/22 (from the past 48 hour(s))  CBC with Differential/Platelet     Status: Abnormal   Collection Time: 01/08/23  3:00 PM  Result Value Ref Range   WBC 5.3 4.0 - 10.5 K/uL   RBC 4.50 4.22 - 5.81 MIL/uL   Hemoglobin 13.7 13.0 - 17.0 g/dL   HCT 40.7 39.0 - 52.0 %   MCV 90.4 80.0 - 100.0 fL   MCH 30.4 26.0 - 34.0 pg   MCHC 33.7 30.0 - 36.0 g/dL   RDW 12.6 11.5 - 15.5 %   Platelets 175 150 - 400 K/uL   nRBC 0.0 0.0 - 0.2 %   Neutrophils Relative % 66 %   Neutro Abs 3.5 1.7 - 7.7 K/uL   Lymphocytes Relative 20 %   Lymphs Abs 1.1 0.7 - 4.0  K/uL   Monocytes Relative 11 %   Monocytes Absolute 0.6 0.1 - 1.0 K/uL    Eosinophils Relative 0 %   Eosinophils Absolute 0.0 0.0 - 0.5 K/uL   Basophils Relative 3 %   Basophils Absolute 0.2 (H) 0.0 - 0.1 K/uL   Immature Granulocytes 0 %   Abs Immature Granulocytes 0.01 0.00 - 0.07 K/uL    Comment: Performed at [facility], [address] [place], [place] [address]    Blood Alcohol level:  Lab Results  Component Value Date   [facility] <10 16/60/6301    Metabolic Disorder Labs: Lab Results  Component Value Date   HGBA1C 5.5 04/10/2022   MPG 111.15 04/10/2022   No results found for: "PROLACTIN" Lab Results  Component Value Date   CHOL 167 11/20/2021   TRIG 107 11/20/2021   HDL 26 (L) 11/20/2021   CHOLHDL 6.4 11/20/2021   VLDL 21 11/20/2021   LDLCALC 120 (H) 11/20/2021    Physical Findings: AIMS: Facial and Oral Movements Muscles of Facial Expression: None, normal Lips and Perioral Area: None, normal Jaw: None, normal Tongue: None, normal,Extremity Movements Upper (arms, wrists, hands, fingers): None, normal Lower (legs, knees, ankles, toes): None, normal, Trunk Movements Neck, shoulders, hips: None, normal, Overall Severity Severity of abnormal movements (highest score from questions above): None, normal Incapacitation due to abnormal movements: None, normal Patient's awareness of abnormal movements (rate only patient's report): No Awareness, Dental Status Current problems with teeth and/or dentures?: No Does patient usually wear dentures?: No  CIWA:    COWS:     Musculoskeletal: Strength & Muscle Tone: within normal limits Gait & Station: normal Patient leans: N/A  Psychiatric Specialty Exam:  Presentation  General Appearance:  Appropriate for Environment; Casual; Neat; Well Groomed  Eye Contact: Fair  Speech: Slow  Speech Volume: Decreased  Handedness: Left   Mood and Affect  Mood: Anxious  Affect: Congruent   Thought Process  Thought Processes: Goal Directed  Descriptions of  Associations:Circumstantial  Orientation:Partial  Thought Content:Scattered  History of Schizophrenia/Schizoaffective disorder:Yes  Duration of Psychotic Symptoms:Greater than six months  Hallucinations:No data recorded Ideas of Reference:None  Suicidal Thoughts:No data recorded Homicidal Thoughts:No data recorded  Sensorium  Memory: Remote Good  Judgment: Fair  Insight: Fair   Community education officer  Concentration: Fair  Attention Span: Fair  Recall: [place] of Knowledge: Fair  Language: Fair   Psychomotor Activity  Psychomotor Activity:No data recorded  Assets  Assets:No data recorded  Sleep  Sleep:No data recorded   Physical Exam: Physical Exam Vitals reviewed.  Constitutional:      Appearance: Normal appearance.  HENT:     Head: Normocephalic and atraumatic.     Mouth/Throat:     Pharynx: Oropharynx is clear.  Eyes:     Pupils: Pupils are equal, round, and reactive to light.  Cardiovascular:     Rate and Rhythm: Normal rate and regular rhythm.  Pulmonary:     Effort: Pulmonary effort is normal.     Breath sounds: Normal breath sounds.  Abdominal:     General: Abdomen is flat.     Palpations: Abdomen is soft.  Musculoskeletal:        General: Normal range of motion.  Skin:    General: Skin is warm and dry.  Neurological:     General: No focal deficit present.     Mental Status: He is alert. Mental status is at baseline.  Psychiatric:        Attention and  Perception: He is inattentive.        Mood and Affect: Mood normal. Affect is blunt.        Speech: He is noncommunicative.    Review of Systems  Unable to perform ROS: Language   Blood pressure 136/89, pulse 97, temperature 97.8 F (36.6 C), temperature source Oral, resp. rate 18, height 5\' 8"  (1.727 m), weight 85.8 kg, SpO2 100 %. Body mass index is 28.76 kg/m.   Treatment Plan Summary: Medication management and Plan no change in overall plan which is seeking out  discharge.  At the request of some stakeholder we got physical therapy and occupational therapy consult yesterday.  Appreciate the input very much.  No surprise the patient does not require any PT or OT management.  , MD 01/10/2023, 11:28 AM

## 2023-01-10 NOTE — Progress Notes (Addendum)
Patient calm and pleasant during assessment. Pt denies SI/HI/AVH. Pt observed interacting appropriately with staff and peers on the unit. Pt compliant with medication administration per MD orders. Pt being monitored Q 15 minutes for safety per unit protocol, remains safe on the unit

## 2023-01-10 NOTE — Progress Notes (Signed)
[facility] LCSW Progress Note   01/10/2023    3:14 PM  Kylil Swopes   [phone]   Type of Contact and Topic:  Care Coordination   CSW attempted to reach Ainsley Spinner, group home owner of [facility] after he no showed to scheduled meeting with [place]. Mr Stann Mainland had mentioned in a prior conversation that he was considering taking patient pending financial requirements.   This is the third attempt CSW has made to reach Mr Stann Mainland. CSW team will continue to make attempts to reach group home owner regarding placement. Situation ongoing, CSW will continue to monitor and update note as more information becomes available.     Signed:  [name], MSW, LCSW, LCAS 01/10/2023 3:14 PM

## 2023-01-11 NOTE — Progress Notes (Signed)
Patient calm and pleasant during assessment. Pt denies SI/HI/AVH. Pt observed interacting appropriately with staff and peers on the unit. Pt compliant with medication administration per MD orders. Pt being monitored Q 15 minutes for safety per unit protocol, remains safe on the unit

## 2023-01-11 NOTE — Plan of Care (Signed)
D- Patient alert and oriented to person, place, and situation. Patient is childlike and due to a past stroke, he has expressive aphasia. Patient presented in a pleasant mood on assessment stating that he slept good last night and had complaints of right leg pain. Patient rated his pain a "6/10", in which he did request PRN pain medication to help with relief. Patient denies SI, HI, AVH at this time, by shaking his head and saying "no". Patient also denies any signs/symptoms of depression and anxiety. Patient had no stated goals for today.  A- Scheduled medications administered to patient, per MD orders. Support and encouragement provided.  Routine safety checks conducted every 15 minutes.  Patient informed to notify staff with problems or concerns.  R- No adverse drug reactions noted. Patient contracts for safety at this time. Patient compliant with medications. Patient receptive, calm, and cooperative. Patient remains safe at this time.  Problem: Education: Goal: Ability to state activities that reduce stress will improve Outcome: Progressing   Problem: Coping: Goal: Ability to identify and develop effective coping behavior will improve Outcome: Progressing   Problem: Self-Concept: Goal: Ability to identify factors that promote anxiety will improve Outcome: Progressing Goal: Level of anxiety will decrease Outcome: Progressing Goal: Ability to modify response to factors that promote anxiety will improve Outcome: Progressing   Problem: Education: Goal: Knowledge of General Education information will improve Description: Including pain rating scale, medication(s)/side effects and non-pharmacologic comfort measures Outcome: Progressing   Problem: Health Behavior/Discharge Planning: Goal: Ability to manage health-related needs will improve Outcome: Progressing   Problem: Clinical Measurements: Goal: Ability to maintain clinical measurements within normal limits will improve Outcome:  Progressing Goal: Will remain free from infection Outcome: Progressing Goal: Diagnostic test results will improve Outcome: Progressing Goal: Respiratory complications will improve Outcome: Progressing Goal: Cardiovascular complication will be avoided Outcome: Progressing   Problem: Activity: Goal: Risk for activity intolerance will decrease Outcome: Progressing   Problem: Nutrition: Goal: Adequate nutrition will be maintained Outcome: Progressing   Problem: Coping: Goal: Level of anxiety will decrease Outcome: Progressing   Problem: Elimination: Goal: Will not experience complications related to bowel motility Outcome: Progressing Goal: Will not experience complications related to urinary retention Outcome: Progressing   Problem: Pain Managment: Goal: General experience of comfort will improve Outcome: Progressing   Problem: Safety: Goal: Ability to remain free from injury will improve Outcome: Progressing   Problem: Skin Integrity: Goal: Risk for impaired skin integrity will decrease Outcome: Progressing   Problem: Activity: Goal: Interest or engagement in leisure activities will improve Outcome: Progressing   Problem: Coping: Goal: Coping ability will improve Outcome: Progressing   Problem: Education: Goal: Will be free of psychotic symptoms Outcome: Progressing

## 2023-01-11 NOTE — [facility] Counselor (Signed)
CSW attempted to contact [facility], [place], [phone].  CSW left HIPAA compliant voicemail.  Assunta Curtis, MSW, LCSW 01/11/2023 3:04 PM

## 2023-01-11 NOTE — [facility] Group Notes (Signed)
[facility] Group Notes:  (Nursing/MHT/Case Management/Adjunct)  Date:  01/11/2023  Time:  10:11 AM  Type of Therapy:   Community Meeting  Participation Level:  Active  Participation Quality:  Appropriate  Affect:  Appropriate  Cognitive:  Appropriate  Insight:  Appropriate  Engagement in Group:  Engaged  Modes of Intervention:  Discussion, Education, and Support  Summary of Progress/Problems:  Adam [name] 01/11/2023, 10:11 AM

## 2023-01-11 NOTE — Progress Notes (Signed)
Patient came up to the nurses station with shaving cream in his hand. This Probation officer knows from previous experience that patient wants to shave. This writer shaved patient's face, in which he tolerated well, without any issues. Patient remains safe on the unit.    01/11/23 [place]

## 2023-01-11 NOTE — Progress Notes (Signed)
[facility] MD Progress Note  01/11/2023 4:07 PM Adam [name]  MRN:  [id number] Subjective: Follow-up 62 year old man with schizophrenia.  No change to behavior.  No new complaints.  Nursing mentions that he has been complaining about pain in his leg on an almost daily basis.  I approached him late morning to ask about it and he denied it and did not want me to examine it.  No limp no obvious problem visible.  Patient seemed unconcerned. 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[place])   Stroke (HCC)   HTN (hypertension)   HLD (hyperlipidemia)   CAD (coronary artery disease)   Chronic systolic CHF (congestive heart failure) (HCC)   Anxiety   Infection of left earlobe  Total Time spent with patient: 20 minutes  Past Psychiatric History: Past history of schizophrenia  Past Medical History:  Past Medical History:  Diagnosis Date   Myocardial infarction ([place])    Schizophrenia ([place])    Stroke ([place])     Past Surgical History:  Procedure Laterality Date   NO PAST SURGERIES     Family History:  Family History  Problem Relation Age of Onset   Hypertension Mother    Family Psychiatric  History: See previous Social History:  Social History   Substance and Sexual Activity  Alcohol Use Not Currently     Social History   Substance and Sexual Activity  Drug Use Not Currently    Social History   Socioeconomic History   Marital status: Single    Spouse name: Not on file   Number of children: Not on file   Years of education: Not on file   Highest education level: Not on file  Occupational History   Not on file  Tobacco Use   Smoking status: Never    Passive exposure: Never   Smokeless tobacco: Never  Vaping Use   Vaping Use: Unknown  Substance and Sexual Activity    Alcohol use: Not Currently   Drug use: Not Currently   Sexual activity: Not Currently  Other Topics Concern   Not on file  Social History Narrative   Not on file   Social Determinants of Health   Financial Resource Strain: Not on file  Food Insecurity: Not on file  Transportation Needs: Not on file  Physical Activity: Not on file  Stress: Not on file  Social Connections: Not on file   Additional Social History:  Specify valuables returned:  (none)                      Sleep: Fair  Appetite:  Fair  Current Medications: Current Facility-Administered Medications  Medication Dose Route Frequency Provider Last Rate Last Admin   acetaminophen (TYLENOL) tablet 650 mg  650 mg Oral Q6H PRN Rien Marland T, MD   650 mg at 01/11/23 0826   alum & mag hydroxide-simeth (MAALOX/MYLANTA) 200-200-20 MG/5ML suspension 30 mL  30 mL Oral Q4H PRN Loi Rennaker T, MD   30 mL at 04/26/22 [id number]   aspirin EC tablet 81 mg  81 mg Oral Daily Bleu Minerd, [name], MD   81 mg at 01/11/23 0826   atorvastatin (LIPITOR) tablet 20 mg  20 mg Oral Daily Japji Kok T, MD   20 mg at 01/11/23 0826   atropine 1 % ophthalmic solution 1 drop  1 drop Sublingual  QID [name], MD   1 drop at 01/11/23 1205   benztropine (COGENTIN) tablet 0.5 mg  0.5 mg Oral BID Zakai Gonyea T, MD   0.5 mg at 01/11/23 [id number]   clonazePAM (KLONOPIN) tablet 1 mg  1 mg Oral TID PRN He, [name], MD   1 mg at 12/31/22 1414   cloZAPine (CLOZARIL) tablet 300 mg  300 mg Oral QHS Daruis Swaim T, MD   300 mg at 01/10/23 2105   diphenhydrAMINE (BENADRYL) capsule 50 mg  50 mg Oral Q6H PRN Delon Revelo T, MD   50 mg at 12/31/22 1414   Or   diphenhydrAMINE (BENADRYL) injection 50 mg  50 mg Intramuscular Q6H PRN Nichoel Digiulio T, MD   50 mg at 01/01/23 2252   feeding supplement (ENSURE ENLIVE / ENSURE PLUS) liquid 237 mL  237 mL Oral BID BM Saim Almanza T, MD   237 mL at 01/11/23 1400   gabapentin (NEURONTIN) capsule 300 mg  300 mg Oral TID  Cataleia Gade T, MD   300 mg at 01/11/23 0900   haloperidol (HALDOL) tablet 2 mg  2 mg Oral TID Genna Casimir T, MD   2 mg at 01/11/23 0900   haloperidol (HALDOL) tablet 5 mg  5 mg Oral Q6H PRN Rockey Guarino, [name], MD   5 mg at 12/14/22 1709   Or   haloperidol lactate (HALDOL) injection 5 mg  5 mg Intramuscular Q6H PRN Chrysten Woulfe T, MD   5 mg at 01/01/23 2252   lisinopril (ZESTRIL) tablet 5 mg  5 mg Oral Daily Leen Tworek T, MD   5 mg at 01/11/23 [id number]   magnesium hydroxide (MILK OF MAGNESIA) suspension 30 mL  30 mL Oral Daily PRN Jessabelle Markiewicz T, MD   30 mL at 12/03/22 1526   metoprolol succinate (TOPROL-XL) 24 hr tablet 25 mg  25 mg Oral Daily Srihaan Mastrangelo T, MD   25 mg at 01/11/23 0826   ondansetron (ZOFRAN) tablet 4 mg  4 mg Oral Q8H PRN [name], NP   4 mg at 09/06/22 2320   paliperidone (INVEGA SUSTENNA) injection 156 mg  156 mg Intramuscular Q28 days Mario Coronado T, MD   156 mg at 12/18/22 1654   paliperidone (INVEGA) 24 hr tablet 6 mg  6 mg Oral QHS Melane Windholz T, MD   6 mg at 01/10/23 2104   senna-docusate (Senokot-S) tablet 2 tablet  2 tablet Oral QHS PRN [name], DO   2 tablet at 09/15/22 2110   temazepam (RESTORIL) capsule 15 mg  15 mg Oral QHS Daimon Kean T, MD   15 mg at 01/10/23 2104   ziprasidone (GEODON) injection 20 mg  20 mg Intramuscular Q12H PRN Calysta Craigo, [name], MD   20 mg at 07/08/22 1453    Lab Results: No results found for this or any previous visit (from the past 73 hour(s)).  Blood Alcohol level:  Lab Results  Component Value Date   ETH <10 [id number]    Metabolic Disorder Labs: Lab Results  Component Value Date   HGBA1C 5.5 04/10/2022   MPG 111.15 04/10/2022   No results found for: "PROLACTIN" Lab Results  Component Value Date   CHOL 167 11/20/2021   TRIG 107 11/20/2021   HDL 26 (L) 11/20/2021   CHOLHDL 6.4 11/20/2021   VLDL 21 11/20/2021   LDLCALC 120 (H) 11/20/2021    Physical Findings: AIMS: Facial and Oral  Movements Muscles of Facial Expression: None, normal Lips  and Perioral Area: None, normal Jaw: None, normal Tongue: None, normal,Extremity Movements Upper (arms, wrists, hands, fingers): None, normal Lower (legs, knees, ankles, toes): None, normal, Trunk Movements Neck, shoulders, hips: None, normal, Overall Severity Severity of abnormal movements (highest score from questions above): None, normal Incapacitation due to abnormal movements: None, normal Patient's awareness of abnormal movements (rate only patient's report): No Awareness, Dental Status Current problems with teeth and/or dentures?: No Does patient usually wear dentures?: No  CIWA:    COWS:     Musculoskeletal: Strength & Muscle Tone: within normal limits Gait & Station: normal Patient leans: N/A  Psychiatric Specialty Exam:  Presentation  General Appearance:  Appropriate for Environment; Casual; Neat; Well Groomed  Eye Contact: Fair  Speech: Slow  Speech Volume: Decreased  Handedness: Left   Mood and Affect  Mood: Anxious  Affect: Congruent   Thought Process  Thought Processes: Goal Directed  Descriptions of Associations:Circumstantial  Orientation:Partial  Thought Content:Scattered  History of Schizophrenia/Schizoaffective disorder:Yes  Duration of Psychotic Symptoms:Greater than six months  Hallucinations:No data recorded Ideas of Reference:None  Suicidal Thoughts:No data recorded Homicidal Thoughts:No data recorded  Sensorium  Memory: Remote Good  Judgment: Fair  Insight: Fair   Community education officer  Concentration: Fair  Attention Span: Fair  Recall: [employer] of Knowledge: Fair  Language: Fair   Psychomotor Activity  Psychomotor Activity:No data recorded  Assets  Assets:No data recorded  Sleep  Sleep:No data recorded   Physical Exam: Physical Exam Vitals and nursing note reviewed.  Constitutional:      Appearance: Normal appearance.  HENT:      Head: Normocephalic and atraumatic.     Mouth/Throat:     Pharynx: Oropharynx is clear.  Eyes:     Pupils: Pupils are equal, round, and reactive to light.  Cardiovascular:     Rate and Rhythm: Normal rate and regular rhythm.  Pulmonary:     Effort: Pulmonary effort is normal.     Breath sounds: Normal breath sounds.  Abdominal:     General: Abdomen is flat.     Palpations: Abdomen is soft.  Musculoskeletal:        General: Normal range of motion.  Skin:    General: Skin is warm and dry.  Neurological:     General: No focal deficit present.     Mental Status: He is alert. Mental status is at baseline.  Psychiatric:        Attention and Perception: He is inattentive.        Mood and Affect: Mood normal. Affect is blunt.        Speech: He is noncommunicative.    Review of Systems  Unable to perform ROS: Language   Blood pressure (!) 132/90, pulse 98, temperature 98.1 F (36.7 C), temperature source Oral, resp. rate 18, height 5' 8"$  (1.727 m), weight 85.8 kg, SpO2 100 %. Body mass index is 28.76 kg/m.   Treatment Plan Summary: Medication management and Plan no change to medication.  Still working on placement  Alethia Berthold, MD 01/11/2023, 4:07 PM

## 2023-01-12 NOTE — Progress Notes (Signed)
[facility] MD Progress Note  01/12/2023 12:03 PM Adam [name]  MRN:  [id number] Subjective: Adam [name] is seen on rounds.  No changes with Adam [name].  He is pleasant and cooperative and good controls.  Nurses report no issues. 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[place])   Stroke (HCC)   HTN (hypertension)   HLD (hyperlipidemia)   CAD (coronary artery disease)   Chronic systolic CHF (congestive heart failure) (HCC)   Anxiety   Infection of left earlobe  Total Time spent with patient: 15 minutes  Past Psychiatric History: CVA and schizophrenia  Past Medical History:  Past Medical History:  Diagnosis Date   Myocardial infarction (Adam [name])    Schizophrenia (Adam [name])    Stroke (Adam [name])     Past Surgical History:  Procedure Laterality Date   NO PAST SURGERIES     Family History:  Family History  Problem Relation Age of Onset   Hypertension Mother    Family Psychiatric  History: Unremarkable Social History:  Social History   Substance and Sexual Activity  Alcohol Use Not Currently     Social History   Substance and Sexual Activity  Drug Use Not Currently    Social History   Socioeconomic History   Marital status: Single    Spouse name: Not on file   Number of children: Not on file   Years of education: Not on file   Highest education level: Not on file  Occupational History   Not on file  Tobacco Use   Smoking status: Never    Passive exposure: Never   Smokeless tobacco: Never  Vaping Use   Vaping Use: Unknown  Substance and Sexual Activity   Alcohol use: Not Currently   Drug use: Not Currently   Sexual activity: Not Currently  Other Topics Concern   Not on file  Social History Narrative   Not on file   Social Determinants of Health   Financial Resource  Strain: Not on file  Food Insecurity: Not on file  Transportation Needs: Not on file  Physical Activity: Not on file  Stress: Not on file  Social Connections: Not on file   Additional Social History:  Specify valuables returned:  (none)                      Sleep: Good  Appetite:  Good  Current Medications: Current Facility-Administered Medications  Medication Dose Route Frequency Provider Last Rate Last Admin   acetaminophen (TYLENOL) tablet 650 mg  650 mg Oral Q6H PRN [name], [name], MD   650 mg at 01/11/23 0826   alum & mag hydroxide-simeth (MAALOX/MYLANTA) 200-200-20 MG/5ML suspension 30 mL  30 mL Oral Q4H PRN [name], [name], MD   30 mL at 04/26/22 [id number]   aspirin EC tablet 81 mg  81 mg Oral Daily [name], [name], MD   81 mg at 01/12/23 0848   atorvastatin (LIPITOR) tablet 20 mg  20 mg Oral Daily [name], [name], MD   20 mg at 01/12/23 0848   atropine 1 % ophthalmic solution 1 drop  1 drop Sublingual QID [name], MD   1 drop at 01/11/23 2104   benztropine (COGENTIN) tablet 0.5 mg  0.5 mg Oral BID [name], [name], MD   0.5 mg at 01/12/23 0848   clonazePAM (KLONOPIN) tablet 1 mg  1  mg Oral TID PRN He, [name], MD   1 mg at 12/31/22 1414   cloZAPine (CLOZARIL) tablet 300 mg  300 mg Oral QHS [name], [name], MD   300 mg at 01/11/23 2105   diphenhydrAMINE (BENADRYL) capsule 50 mg  50 mg Oral Q6H PRN [name], [name], MD   50 mg at 12/31/22 1414   Or   diphenhydrAMINE (BENADRYL) injection 50 mg  50 mg Intramuscular Q6H PRN [name], [name], MD   50 mg at 01/01/23 2252   feeding supplement (ENSURE ENLIVE / ENSURE PLUS) liquid 237 mL  237 mL Oral BID BM [name], [name], MD   237 mL at 01/12/23 0900   gabapentin (NEURONTIN) capsule 300 mg  300 mg Oral TID [name], [name], MD   300 mg at 01/12/23 0900   haloperidol (HALDOL) tablet 2 mg  2 mg Oral TID [name], [name], MD   2 mg at 01/12/23 0900   haloperidol (HALDOL) tablet 5 mg  5 mg Oral Q6H PRN [name], [name], MD   5 mg at  12/14/22 1709   Or   haloperidol lactate (HALDOL) injection 5 mg  5 mg Intramuscular Q6H PRN [name], [name], MD   5 mg at 01/01/23 2252   lisinopril (ZESTRIL) tablet 5 mg  5 mg Oral Daily [name], [name], MD   5 mg at 01/12/23 0848   magnesium hydroxide (MILK OF MAGNESIA) suspension 30 mL  30 mL Oral Daily PRN [name], [name], MD   30 mL at 12/03/22 1526   metoprolol succinate (TOPROL-XL) 24 hr tablet 25 mg  25 mg Oral Daily [name], [name], MD   25 mg at 01/12/23 0848   ondansetron (ZOFRAN) tablet 4 mg  4 mg Oral Q8H PRN [name], NP   4 mg at 09/06/22 2320   paliperidone (INVEGA SUSTENNA) injection 156 mg  156 mg Intramuscular Q28 days [name], [name], MD   156 mg at 12/18/22 1654   paliperidone (INVEGA) 24 hr tablet 6 mg  6 mg Oral QHS [name], [name], MD   6 mg at 01/11/23 2104   senna-docusate (Senokot-S) tablet 2 tablet  2 tablet Oral QHS PRN [name], DO   2 tablet at 09/15/22 2110   temazepam (RESTORIL) capsule 15 mg  15 mg Oral QHS [name], [name], MD   15 mg at 01/11/23 2105   ziprasidone (GEODON) injection 20 mg  20 mg Intramuscular Q12H PRN [name], [name], MD   20 mg at 07/08/22 1453    Lab Results: No results found for this or any previous visit (from the past 55 hour(s)).  Blood Alcohol level:  Lab Results  Component Value Date   ETH <10 [id number]    Metabolic Disorder Labs: Lab Results  Component Value Date   HGBA1C 5.5 04/10/2022   MPG 111.15 04/10/2022   No results found for: "PROLACTIN" Lab Results  Component Value Date   CHOL 167 11/20/2021   TRIG 107 11/20/2021   HDL 26 (L) 11/20/2021   CHOLHDL 6.4 11/20/2021   VLDL 21 11/20/2021   LDLCALC 120 (H) 11/20/2021    Physical Findings: AIMS: Facial and Oral Movements Muscles of Facial Expression: None, normal Lips and Perioral Area: None, normal Jaw: None, normal Tongue: None, normal,Extremity Movements Upper (arms, wrists, hands, fingers): None, normal Lower (legs, knees, ankles,  toes): None, normal, Trunk Movements Neck, shoulders, hips: None, normal, Overall Severity Severity of abnormal movements (highest score from questions  above): None, normal Incapacitation due to abnormal movements: None, normal Patient's awareness of abnormal movements (rate only patient's report): No Awareness, Dental Status Current problems with teeth and/or dentures?: No Does patient usually wear dentures?: No  CIWA:    COWS:     Musculoskeletal: Strength & Muscle Tone: within normal limits Gait & Station: normal Patient leans: N/A  Psychiatric Specialty Exam:  Presentation  General Appearance:  Appropriate for Environment; Casual; Neat; Well Groomed  Eye Contact: Fair  Speech: Slow  Speech Volume: Decreased  Handedness: Left   Mood and Affect  Mood: Anxious  Affect: Congruent   Thought Process  Thought Processes: Goal Directed  Descriptions of Associations:Circumstantial  Orientation:Partial  Thought Content:Scattered  History of Schizophrenia/Schizoaffective disorder:Yes  Duration of Psychotic Symptoms:Greater than six months  Hallucinations:No data recorded Ideas of Reference:None  Suicidal Thoughts:No data recorded Homicidal Thoughts:No data recorded  Sensorium  Memory: Remote Good  Judgment: Fair  Insight: Fair   Materials engineer: Fair  Attention Span: Fair  Recall: [place] of Knowledge: Fair  Language: Fair   Psychomotor Activity  Psychomotor Activity:No data recorded  Assets  Assets:No data recorded  Sleep  Sleep:No data recorded    Blood pressure 110/63, pulse (!) 108, temperature 98.2 F (36.8 C), temperature source Oral, resp. rate 18, height 5' 8"$  (1.727 m), weight 85.8 kg, SpO2 100 %. Body mass index is 28.76 kg/m.   Treatment Plan Summary: Daily contact with patient to assess and evaluate symptoms and progress in treatment, Medication management, and Plan continue current  medications.  [name], DO 01/12/2023, 12:03 PM

## 2023-01-12 NOTE — Progress Notes (Addendum)
Patient did not understand that he had already taken his scheduled evening medication at 1600. This writer was trying to explain this to patient, but he would not listen and hollered "no", shaking his head, pointing to the Pyxis. This Probation officer gave patient PRN Haldol, for agitation. Patient tolerated medication well, without any issues. Patient said "yeah", smiled at this writer, and walked off. Patient remains safe on the unit.

## 2023-01-12 NOTE — [facility] Group Notes (Signed)
LCSW Wellness Group Note   01/12/2023 1:00pm  Type of Group and Topic: Psychoeducational Group:  Wellness  Participation Level:  none  Description of Group  Wellness group introduces the topic and its focus on developing healthy habits across the spectrum and its relationship to a decrease in hospital admissions.  Six areas of wellness are discussed: physical, social spiritual, intellectual, occupational, and emotional.  Patients are asked to consider their current wellness habits and to identify areas of wellness where they are interested and able to focus on improvements.    Therapeutic Goals Patients will understand components of wellness and how they can positively impact overall health.  Patients will identify areas of wellness where they have developed good habits. Patients will identify areas of wellness where they would like to make improvements.    Summary of Patient Progress: [place] joined group at the beginning, left several times and then would return.  Several loud outbursts but did quiet down when asked to do so by CSW.       Therapeutic Modalities: Cognitive Behavioral Therapy Psychoeducation    Joanne Chars, LCSW

## 2023-01-12 NOTE — [facility] Group Notes (Signed)
[facility] Group Notes:  (Nursing/MHT/Case Management/Adjunct)  Date:  01/12/2023  Time:  4:17 PM  Type of Therapy:  Psychoeducational Skills  Participation Level:  Active  Participation Quality:  Appropriate  Affect:  Appropriate  Cognitive:  Appropriate  Insight:  Appropriate  Engagement in Group:  Engaged  Modes of Intervention:  Activity  Summary of Progress/Problems:  Adela Lank [facility] 01/12/2023, 4:17 PM

## 2023-01-12 NOTE — Plan of Care (Signed)
D- Patient alert and oriented person, place, and situation. Patient is childlike and due to a past stroke, he has expressive aphasia. Patient presented in a preoccupied, but pleasant mood on assessment stating that he slept ok last night and had no complaints to voice to this Probation officer. Patient denied SI, HI, AVH, and pain at this time. Patient also denied any signs/symptoms of depression and anxiety by shaking his head, with a smirk on his face, and saying "no".  A- Scheduled medications administered to patient, per MD orders. Support and encouragement provided.  Routine safety checks conducted every 15 minutes.  Patient informed to notify staff with problems or concerns.  R- No adverse drug reactions noted. Patient contracts for safety at this time. Patient compliant with medications and treatment plan. Patient receptive, calm, and cooperative. Patient interacts well with others on the unit. Patient remains safe at this time.  Problem: Education: Goal: Ability to state activities that reduce stress will improve Outcome: Progressing   Problem: Coping: Goal: Ability to identify and develop effective coping behavior will improve Outcome: Progressing   Problem: Self-Concept: Goal: Ability to identify factors that promote anxiety will improve Outcome: Progressing Goal: Level of anxiety will decrease Outcome: Progressing Goal: Ability to modify response to factors that promote anxiety will improve Outcome: Progressing   Problem: Education: Goal: Knowledge of General Education information will improve Description: Including pain rating scale, medication(s)/side effects and non-pharmacologic comfort measures Outcome: Progressing   Problem: Health Behavior/Discharge Planning: Goal: Ability to manage health-related needs will improve Outcome: Progressing   Problem: Clinical Measurements: Goal: Ability to maintain clinical measurements within normal limits will improve Outcome:  Progressing Goal: Will remain free from infection Outcome: Progressing Goal: Diagnostic test results will improve Outcome: Progressing Goal: Respiratory complications will improve Outcome: Progressing Goal: Cardiovascular complication will be avoided Outcome: Progressing   Problem: Activity: Goal: Risk for activity intolerance will decrease Outcome: Progressing   Problem: Nutrition: Goal: Adequate nutrition will be maintained Outcome: Progressing   Problem: Coping: Goal: Level of anxiety will decrease Outcome: Progressing   Problem: Elimination: Goal: Will not experience complications related to bowel motility Outcome: Progressing Goal: Will not experience complications related to urinary retention Outcome: Progressing   Problem: Pain Managment: Goal: General experience of comfort will improve Outcome: Progressing   Problem: Safety: Goal: Ability to remain free from injury will improve Outcome: Progressing   Problem: Skin Integrity: Goal: Risk for impaired skin integrity will decrease Outcome: Progressing   Problem: Activity: Goal: Interest or engagement in leisure activities will improve Outcome: Progressing   Problem: Coping: Goal: Coping ability will improve Outcome: Progressing   Problem: Education: Goal: Will be free of psychotic symptoms Outcome: Progressing

## 2023-01-12 NOTE — Progress Notes (Signed)
Patient's BP is 166/103 during evening vitals. This Probation officer will inform oncoming shift of this information.

## 2023-01-13 NOTE — Plan of Care (Signed)
Patient up in the day room and watch TV most of the afternoon. Patient at the nurses station for multiple reasons. No irritable behaviors noted. Appetite and energy level good. Compliant with medications. Support and encouragement given.

## 2023-01-13 NOTE — Progress Notes (Signed)
Patient alert and oriented x 4 with periods of confusion to place and situation. Patient ;s blood pressure was taken 146/94, he denies pain or discomfort. Patient was seen interacting appropriately with friends in the dayroom no distress noted, he denies SI/HI/AVH. 15 minutes safety checks maintained will continue to monitor.

## 2023-01-13 NOTE — [facility] Group Notes (Signed)
LCSW Group Therapy Note  01/13/2023 1230pm  Type of Therapy/Topic:  Group Therapy:  Balance in Life  Participation Level:  None  Description of Group:    This group will address the concept of balance and how it feels and looks when one is unbalanced. Patients will be encouraged to process areas in their lives that are out of balance and identify reasons for remaining unbalanced. Facilitators will guide patients in utilizing problem-solving interventions to address and correct the stressor making their life unbalanced. Understanding and applying boundaries will be explored and addressed for obtaining and maintaining a balanced life. Patients will be encouraged to explore ways to assertively make their unbalanced needs known to significant others in their lives, using other group members and facilitator for support and feedback.  Therapeutic Goals: Patient will identify two or more emotions or situations they have that consume much of in their lives. Patient will identify signs/triggers that life has become out of balance:  Patient will identify two ways to set boundaries in order to achieve balance in their lives:  Patient will demonstrate ability to communicate their needs through discussion and/or role plays  Summary of Patient Progress:Demba sat through entire group, no outbursts.  At  one point opened up his group workbook and wanted to show one of the pictures.  Other group members responded to him positively.  Not disruptive today at all.        Therapeutic Modalities:   Cognitive Behavioral Therapy Solution-Focused Therapy Assertiveness Training  Deirdre Evener 01/13/2023 1:31 PM

## 2023-01-13 NOTE — Progress Notes (Signed)
[facility] MD Progress Note  01/13/2023 11:53 AM Adam [name]  MRN:  [id number] Subjective: Adam [name] is seen on rounds.  No changes.  Nurses report no issues.  He has no complaints. 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[place])   Stroke (HCC)   HTN (hypertension)   HLD (hyperlipidemia)   CAD (coronary artery disease)   Chronic systolic CHF (congestive heart failure) (HCC)   Anxiety   Infection of left earlobe  Total Time spent with patient: 15 minutes  Past Psychiatric History: CVA and history of schizophrenia  Past Medical History:  Past Medical History:  Diagnosis Date   Myocardial infarction ([place])    Schizophrenia ([name])    Stroke ([name])     Past Surgical History:  Procedure Laterality Date   NO PAST SURGERIES     Family History:  Family History  Problem Relation Age of Onset   Hypertension Mother    Family Psychiatric  History: Unremarkable Social History:  Social History   Substance and Sexual Activity  Alcohol Use Not Currently     Social History   Substance and Sexual Activity  Drug Use Not Currently    Social History   Socioeconomic History   Marital status: Single    Spouse name: Not on file   Number of children: Not on file   Years of education: Not on file   Highest education level: Not on file  Occupational History   Not on file  Tobacco Use   Smoking status: Never    Passive exposure: Never   Smokeless tobacco: Never  Vaping Use   Vaping Use: Unknown  Substance and Sexual Activity   Alcohol use: Not Currently   Drug use: Not Currently   Sexual activity: Not Currently  Other Topics Concern   Not on file  Social History Narrative   Not on file   Social Determinants of Health   Financial Resource Strain: Not on file  Food  Insecurity: Not on file  Transportation Needs: Not on file  Physical Activity: Not on file  Stress: Not on file  Social Connections: Not on file   Additional Social History:  Specify valuables returned:  (none)                      Sleep: Good  Appetite:  Good  Current Medications: Current Facility-Administered Medications  Medication Dose Route Frequency Provider Last Rate Last Admin   acetaminophen (TYLENOL) tablet 650 mg  650 mg Oral Q6H PRN [name], [name], MD   650 mg at 01/11/23 0826   alum & mag hydroxide-simeth (MAALOX/MYLANTA) 200-200-20 MG/5ML suspension 30 mL  30 mL Oral Q4H PRN [name], [name], MD   30 mL at 04/26/22 [id number]   aspirin EC tablet 81 mg  81 mg Oral Daily [name], [name], MD   81 mg at 01/13/23 0829   atorvastatin (LIPITOR) tablet 20 mg  20 mg Oral Daily [name], [name], MD   20 mg at 01/13/23 0829   atropine 1 % ophthalmic solution 1 drop  1 drop Sublingual QID [name], MD   1 drop at 01/13/23 0829   benztropine (COGENTIN) tablet 0.5 mg  0.5 mg Oral BID [name], [name], MD   0.5 mg at 01/13/23 0829   clonazePAM (KLONOPIN) tablet 1 mg  1 mg Oral TID PRN  He, [name], MD   1 mg at 12/31/22 1414   cloZAPine (CLOZARIL) tablet 300 mg  300 mg Oral QHS [name], [name], MD   300 mg at 01/12/23 2146   diphenhydrAMINE (BENADRYL) capsule 50 mg  50 mg Oral Q6H PRN [name], [name], MD   50 mg at 12/31/22 1414   Or   diphenhydrAMINE (BENADRYL) injection 50 mg  50 mg Intramuscular Q6H PRN [name], [name], MD   50 mg at 01/01/23 2252   feeding supplement (ENSURE ENLIVE / ENSURE PLUS) liquid 237 mL  237 mL Oral BID BM [name], [name], MD   237 mL at 01/13/23 1013   gabapentin (NEURONTIN) capsule 300 mg  300 mg Oral TID [name], [name], MD   300 mg at 01/13/23 1012   haloperidol (HALDOL) tablet 2 mg  2 mg Oral TID [name], [name], MD   2 mg at 01/13/23 1012   haloperidol (HALDOL) tablet 5 mg  5 mg Oral Q6H PRN [name], [name], MD   5 mg at 01/12/23 1706   Or    haloperidol lactate (HALDOL) injection 5 mg  5 mg Intramuscular Q6H PRN [name], [name], MD   5 mg at 01/01/23 2252   lisinopril (ZESTRIL) tablet 5 mg  5 mg Oral Daily [name], [name], MD   5 mg at 01/13/23 0831   magnesium hydroxide (MILK OF MAGNESIA) suspension 30 mL  30 mL Oral Daily PRN [name], [name], MD   30 mL at 12/03/22 1526   metoprolol succinate (TOPROL-XL) 24 hr tablet 25 mg  25 mg Oral Daily [name], [name], MD   25 mg at 01/13/23 0829   ondansetron (ZOFRAN) tablet 4 mg  4 mg Oral Q8H PRN [name], NP   4 mg at 09/06/22 2320   paliperidone (INVEGA SUSTENNA) injection 156 mg  156 mg Intramuscular Q28 days [name], [name], MD   156 mg at 12/18/22 1654   paliperidone (INVEGA) 24 hr tablet 6 mg  6 mg Oral QHS [name], [name], MD   6 mg at 01/12/23 2145   senna-docusate (Senokot-S) tablet 2 tablet  2 tablet Oral QHS PRN [name], DO   2 tablet at 09/15/22 2110   temazepam (RESTORIL) capsule 15 mg  15 mg Oral QHS [name], [name], MD   15 mg at 01/12/23 2145   ziprasidone (GEODON) injection 20 mg  20 mg Intramuscular Q12H PRN [name], [name], MD   20 mg at 07/08/22 1453    Lab Results: No results found for this or any previous visit (from the past 40 hour(s)).  Blood Alcohol level:  Lab Results  Component Value Date   ETH <10 [id number]    Metabolic Disorder Labs: Lab Results  Component Value Date   HGBA1C 5.5 04/10/2022   MPG 111.15 04/10/2022   No results found for: "PROLACTIN" Lab Results  Component Value Date   CHOL 167 11/20/2021   TRIG 107 11/20/2021   HDL 26 (L) 11/20/2021   CHOLHDL 6.4 11/20/2021   VLDL 21 11/20/2021   LDLCALC 120 (H) 11/20/2021    Physical Findings: AIMS: Facial and Oral Movements Muscles of Facial Expression: None, normal Lips and Perioral Area: None, normal Jaw: None, normal Tongue: None, normal,Extremity Movements Upper (arms, wrists, hands, fingers): None, normal Lower (legs, knees, ankles, toes): None, normal,  Trunk Movements Neck, shoulders, hips: None, normal, Overall Severity Severity of abnormal movements (highest score from questions above): None, normal Incapacitation  due to abnormal movements: None, normal Patient's awareness of abnormal movements (rate only patient's report): No Awareness, Dental Status Current problems with teeth and/or dentures?: No Does patient usually wear dentures?: No  CIWA:    COWS:     Musculoskeletal: Strength & Muscle Tone: within normal limits Gait & Station: normal Patient leans: N/A  Psychiatric Specialty Exam:  Presentation  General Appearance:  Appropriate for Environment; Casual; Neat; Well Groomed  Eye Contact: Fair  Speech: Slow  Speech Volume: Decreased  Handedness: Left   Mood and Affect  Mood: Anxious  Affect: Congruent   Thought Process  Thought Processes: Goal Directed  Descriptions of Associations:Circumstantial  Orientation:Partial  Thought Content:Scattered  History of Schizophrenia/Schizoaffective disorder:Yes  Duration of Psychotic Symptoms:Greater than six months  Hallucinations:No data recorded Ideas of Reference:None  Suicidal Thoughts:No data recorded Homicidal Thoughts:No data recorded  Sensorium  Memory: Remote Good  Judgment: Fair  Insight: Fair   Community education officer  Concentration: Fair  Attention Span: Fair  Recall: [employer] of Knowledge: Fair  Language: Fair   Psychomotor Activity  Psychomotor Activity:No data recorded  Assets  Assets:No data recorded  Sleep  Sleep:No data recorded   Physical Exam: Physical Exam Vitals and nursing note reviewed.  Constitutional:      Appearance: Normal appearance. He is normal weight.  Neurological:     General: No focal deficit present.     Mental Status: He is alert and oriented to person, place, and time.  Psychiatric:        Mood and Affect: Mood normal.        Behavior: Behavior normal.    Review of Systems   Constitutional: Negative.   HENT: Negative.    Eyes: Negative.   Respiratory: Negative.    Cardiovascular: Negative.   Gastrointestinal: Negative.   Genitourinary: Negative.   Musculoskeletal: Negative.   Skin: Negative.   Neurological: Negative.   Endo/Heme/Allergies: Negative.   Psychiatric/Behavioral: Negative.     Blood pressure (!) 166/103, pulse 88, temperature 97.8 F (36.6 C), temperature source Oral, resp. rate 18, height 5' 8"$  (1.727 m), weight 85.8 kg, SpO2 100 %. Body mass index is 28.76 kg/m.   Treatment Plan Summary: Daily contact with patient to assess and evaluate symptoms and progress in treatment, Medication management, and Plan continue current medications.  [name], DO 01/13/2023, 11:53 AM

## 2023-01-14 NOTE — [facility] Counselor (Signed)
CSW attempted to contact [facility], [place], [phone].   CSW left HIPAA compliant voicemail.  Assunta Curtis, MSW, LCSW 01/14/2023 11:08 AM

## 2023-01-14 NOTE — Group Note (Signed)
[facility] LCSW Group Therapy Note    Group Date: 01/14/2023 Start Time: 1300 End Time: 1400  Type of Therapy and Topic:  Group Therapy:  Overcoming Obstacles  Participation Level:  [facility] PARTICIPATION LEVEL: None   Description of Group:   In this group patients will be encouraged to explore what they see as obstacles to their own wellness and recovery. They will be guided to discuss their thoughts, feelings, and behaviors related to these obstacles. The group will process together ways to cope with barriers, with attention given to specific choices patients can make. Each patient will be challenged to identify changes they are motivated to make in order to overcome their obstacles. This group will be process-oriented, with patients participating in exploration of their own experiences as well as giving and receiving support and challenge from other group members.  Therapeutic Goals: 1. Patient will identify personal and current obstacles as they relate to admission. 2. Patient will identify barriers that currently interfere with their wellness or overcoming obstacles.  3. Patient will identify feelings, thought process and behaviors related to these barriers. 4. Patient will identify two changes they are willing to make to overcome these obstacles:    Summary of Patient Progress Patient was present for the majority of the group process. His contribution to the group room was "Madonna".    Therapeutic Modalities:   Cognitive Behavioral Therapy Solution Focused Therapy Motivational Interviewing Relapse Prevention Therapy   Shirl Harris, LCSW

## 2023-01-14 NOTE — Progress Notes (Addendum)
Pt denies HI/AVH pt endorse SI on self-inventory and verbally agrees to approach staff if these become apparent or before harming themselves/others. Rates depression 2/10. Rates anxiety 2/10. Rates pain on face scale a 6. Pt was agitated this am morning due to wanting to the Ipad. Pt has been angry at some points and happy at others. Scheduled medications administered to pt, per MD orders. RN provided support and encouragement to pt. Q15 min safety checks implemented and continued. Pt safe on the unit. RN will continue to monitor and intervene as needed.  01/14/23 0938  Psych Admission Type (Psych Patients Only)  Admission Status Involuntary  Psychosocial Assessment  Patient Complaints Anxiety;Depression  Eye Contact Fair  Facial Expression Anxious;Sad;Angry  Affect Anxious;Depressed;Labile  Speech Aphasic  Interaction Childlike;Demanding;Intrusive  Motor Activity Slow;Shuffling  Appearance/Hygiene Poor hygiene  Behavior Characteristics Cooperative;Appropriate to situation;Anxious  Mood Depressed;Anxious;Labile  Aggressive Behavior  Effect No apparent injury  Thought Process  Coherency Concrete thinking  Content Blaming others  Delusions None reported or observed  Perception WDL  Hallucination None reported or observed  Judgment Impaired  Confusion None  Danger to Self  Current suicidal ideation? Passive (wrote on self-inventory)  Self-Injurious Behavior No self-injurious ideation or behavior indicators observed or expressed   Agreement Not to Harm Self Yes  Danger to Others  Danger to Others None reported or observed  Danger to Others Abnormal  Harmful Behavior to others No threats or harm toward other people  Destructive Behavior No threats or harm toward property

## 2023-01-14 NOTE — Group Note (Signed)
Recreation Therapy Group Note   Group Topic:Coping Skills  Group Date: 01/14/2023 Start Time: 1000 End Time: 1040 Facilitators: [name], LRT, CTRS  Location:  Craft Room  Group Description:  Mind Map.  Patient was provided a blank template of a diagram with 32 blank boxes in a tiered system, branching from the center (similar to a bubble chart). LRT directed patients to label the middle of the diagram "Coping Skills". LRT and patients then came up with 8 different coping skills as examples. Pt were directed to record their coping skills in the 2nd tier boxes closest to the center.  Patients would then share their coping skills with the group as LRT wrote them on the board.  Affect/Mood: Appropriate   Participation Level: Minimal   Participation Quality: Minimal Cues   Behavior: Alert   Speech/Thought Process: Unfocused   Insight: Limited   Judgement: Limited   Modes of Intervention: Activity and Worksheet   Patient Response to Interventions:  Disengaged   Education Outcome:  In group clarification offered    Clinical Observations/Individualized Feedback: Adam [name] was minimally active in their participation of session activities and group discussion. Pt yelled out "tablet" a few times throughout group session. However, pt was able to sit still duration of the group.   Plan: Continue to engage patient in RT group sessions 2-3x/week.   [name], LRT, CTRS 01/14/2023 11:00 AM

## 2023-01-14 NOTE — [facility] Group Notes (Signed)
[facility] Group Notes:  (Nursing/MHT/Case Management/Adjunct)  Date:  01/14/2023  Time:  4:50 PM  Type of Therapy:  Psychoeducational Skills  Participation Level:  Active  Participation Quality:  Attentive  Affect:  Appropriate  Cognitive:  Alert and Appropriate  Insight:  Appropriate  Engagement in Group:  Engaged  Modes of Intervention:  Activity  Summary of Progress/Problems:  Adam [name] 01/14/2023, 4:50 PM
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
## 2023-01-14 NOTE — Progress Notes (Signed)
Patient is alert and oriented x 4, affect is flat but brighten upon approach and interacting appropriately with peers and staff. Patient denies SI/HI/AVH, he was offered emotional support. Patient was complaint with medication regimen no distress noted will continue to monitor.

## 2023-01-14 NOTE — Plan of Care (Signed)
  Problem: Nutrition: Goal: Adequate nutrition will be maintained Outcome: Progressing   Problem: Coping: Goal: Level of anxiety will decrease Outcome: Progressing   Problem: Activity: Goal: Interest or engagement in leisure activities will improve Outcome: Progressing   Problem: Coping: Goal: Coping ability will improve Outcome: Not Progressing

## 2023-01-15 LAB — CBC WITH DIFFERENTIAL/PLATELET
Abs Immature Granulocytes: 0.01 10*3/uL (ref 0.00–0.07)
Basophils Absolute: 0.2 10*3/uL — ABNORMAL HIGH (ref 0.0–0.1)
Basophils Relative: 3 %
Eosinophils Absolute: 0 10*3/uL (ref 0.0–0.5)
Eosinophils Relative: 1 %
HCT: 38.9 % — ABNORMAL LOW (ref 39.0–52.0)
Hemoglobin: 13.5 g/dL (ref 13.0–17.0)
Immature Granulocytes: 0 %
Lymphocytes Relative: 21 %
Lymphs Abs: 1.1 10*3/uL (ref 0.7–4.0)
MCH: 31.1 pg (ref 26.0–34.0)
MCHC: 34.7 g/dL (ref 30.0–36.0)
MCV: 89.6 fL (ref 80.0–100.0)
Monocytes Absolute: 0.4 10*3/uL (ref 0.1–1.0)
Monocytes Relative: 9 %
Neutro Abs: 3.4 10*3/uL (ref 1.7–7.7)
Neutrophils Relative %: 66 %
Platelets: 180 10*3/uL (ref 150–400)
RBC: 4.34 MIL/uL (ref 4.22–5.81)
RDW: 12.8 % (ref 11.5–15.5)
WBC: 5.1 10*3/uL (ref 4.0–10.5)
nRBC: 0 % (ref 0.0–0.2)

## 2023-01-15 NOTE — BHH Group Notes (Signed)
[facility] Group Notes:  (Nursing/MHT/Case Management/Adjunct)  Date:  01/15/2023  Time:  4:27 AM  Type of Therapy:  Group Therapy  Participation Level:  Active  Participation Quality:  Intrusive and Monopolizing  Affect:  Angry  Cognitive:  Confused  Insight:  Lacking  Engagement in Group:  Distracting  Modes of Intervention:  Education  Summary of Progress/Problems:  [name],Adam Darling E 01/15/2023, 4:27 AM

## 2023-01-15 NOTE — Progress Notes (Signed)
Pharmacy Consult - Clozapine     62 yo male ordered clozapine 300 mg PO daily  This patient's order has been reviewed for prescribing contraindications.    Clozapine REMS enrollment Verified: yes on 1/7 REMS patient ID: [id number] Current Outpatient Monitoring: Weekly    Home Regimen:  300 mg PO QHS    Dose Adjustments This Admission: N/A   Labs: Date    [place]    Submitted? 1/7 2600 Yes 1/14 3900 Yes 1/21 3600 Yes 1/29 3400 Yes   2/6 3500 Yes   2/13 3400 Yes  Plan: RA: [id number] Continue Clozapine 357m daily  Monitor ANC at least weekly while inpatient   HTollie EthIII, PharmD

## 2023-01-15 NOTE — Group Note (Signed)
LCSW Group Therapy Note   Group Date: 01/15/2023 Start Time: 1310 End Time: 1400   Type of Therapy and Topic:  Group Therapy: Boundaries  Participation Level:  None  Description of Group: This group will address the use of boundaries in their personal lives. Patients will explore why boundaries are important, the difference between healthy and unhealthy boundaries, and negative and postive outcomes of different boundaries and will look at how boundaries can be crossed.  Patients will be encouraged to identify current boundaries in their own lives and identify what kind of boundary is being set. Facilitators will guide patients in utilizing problem-solving interventions to address and correct types boundaries being used and to address when no boundary is being used. Understanding and applying boundaries will be explored and addressed for obtaining and maintaining a balanced life. Patients will be encouraged to explore ways to assertively make their boundaries and needs known to significant others in their lives, using other group members and facilitator for role play, support, and feedback.  Therapeutic Goals:  1.  Patient will identify areas in their life where setting clear boundaries could be  used to improve their life.  2.  Patient will identify signs/triggers that a boundary is not being respected. 3.  Patient will identify two ways to set boundaries in order to achieve balance in  their lives: 4.  Patient will demonstrate ability to communicate their needs and set boundaries  through discussion and/or role plays  Summary of Patient Progress:   Patient was present in group.  Patient's contribution to group was "1961", "Isis" and "seven cats".    Therapeutic Modalities:   Cognitive Behavioral Therapy Solution-Focused Therapy  Rozann Lesches, LCSWA 01/15/2023  2:08 PM

## 2023-01-15 NOTE — BHH Group Notes (Signed)
[facility] Group Notes:  (Nursing/MHT/Case Management/Adjunct)  Date:  01/15/2023  Time:  6:06 PM  Type of Therapy:  Psychoeducational Skills  Participation Level:  Active  Participation Quality:  Appropriate  Affect:  Appropriate  Cognitive:  Appropriate  Insight:  Appropriate  Engagement in Group:  Engaged  Modes of Intervention:  Activity  Summary of Progress/Problems:  Adela Lank [facility] 01/15/2023, 6:06 PM

## 2023-01-15 NOTE — Progress Notes (Signed)
Patient calm and pleasant during assessment. Pt denies SI/HI/AVH. Pt observed interacting appropriately with staff and peers on the unit. Pt compliant with medication administration per MD orders. Pt being monitored Q 15 minutes for safety per unit protocol, remains safe on the unit

## 2023-01-15 NOTE — Progress Notes (Signed)
[facility] MD Progress Note  01/15/2023 12:50 PM Emryk Caulder  MRN:  [id number] Subjective: Adam [name] is seen on rounds.  No changes.  He has been pleasant and cooperative.  Compliant with medications and no side effects.  Nurses report no issues. 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[place])   Stroke (HCC)   HTN (hypertension)   HLD (hyperlipidemia)   CAD (coronary artery disease)   Chronic systolic CHF (congestive heart failure) (HCC)   Anxiety   Infection of left earlobe  Total Time spent with patient: 15 minutes  Past Psychiatric History: CVA and schizophrenia  Past Medical History:  Past Medical History:  Diagnosis Date   Myocardial infarction ([place])    Schizophrenia ([place])    Stroke ([place])     Past Surgical History:  Procedure Laterality Date   NO PAST SURGERIES     Family History:  Family History  Problem Relation Age of Onset   Hypertension Mother    Family Psychiatric  History: Unremarkable Social History:  Social History   Substance and Sexual Activity  Alcohol Use Not Currently     Social History   Substance and Sexual Activity  Drug Use Not Currently    Social History   Socioeconomic History   Marital status: Single    Spouse name: Not on file   Number of children: Not on file   Years of education: Not on file   Highest education level: Not on file  Occupational History   Not on file  Tobacco Use   Smoking status: Never    Passive exposure: Never   Smokeless tobacco: Never  Vaping Use   Vaping Use: Unknown  Substance and Sexual Activity   Alcohol use: Not Currently   Drug use: Not Currently   Sexual activity: Not Currently  Other Topics Concern   Not on file  Social History Narrative   Not on file   Social Determinants of Health    Financial Resource Strain: Not on file  Food Insecurity: Not on file  Transportation Needs: Not on file  Physical Activity: Not on file  Stress: Not on file  Social Connections: Not on file   Additional Social History:  Specify valuables returned:  (none)                      Sleep: Good  Appetite:  Good  Current Medications: Current Facility-Administered Medications  Medication Dose Route Frequency Provider Last Rate Last Admin   acetaminophen (TYLENOL) tablet 650 mg  650 mg Oral Q6H PRN [name], [name], MD   650 mg at 01/15/23 0829   alum & mag hydroxide-simeth (MAALOX/MYLANTA) 200-200-20 MG/5ML suspension 30 mL  30 mL Oral Q4H PRN [name], [name], MD   30 mL at 04/26/22 [id number]   aspirin EC tablet 81 mg  81 mg Oral Daily [name], [name], MD   81 mg at 01/15/23 [id number]   atorvastatin (LIPITOR) tablet 20 mg  20 mg Oral Daily [name], [name], MD   20 mg at 01/15/23 0828   atropine 1 % ophthalmic solution 1 drop  1 drop Sublingual QID [name], MD   1 drop at 01/15/23 1122   benztropine (COGENTIN) tablet 0.5 mg  0.5 mg Oral BID [name], [name], MD   0.5 mg at 01/15/23 0828   clonazePAM (KLONOPIN) tablet  1 mg  1 mg Oral TID PRN He, [name], MD   1 mg at 01/14/23 2044   cloZAPine (CLOZARIL) tablet 300 mg  300 mg Oral QHS [name], [name], MD   300 mg at 01/14/23 2045   diphenhydrAMINE (BENADRYL) capsule 50 mg  50 mg Oral Q6H PRN [name], [name], MD   50 mg at 01/14/23 2045   Or   diphenhydrAMINE (BENADRYL) injection 50 mg  50 mg Intramuscular Q6H PRN [name], [name], MD   50 mg at 01/01/23 2252   feeding supplement (ENSURE ENLIVE / ENSURE PLUS) liquid 237 mL  237 mL Oral BID BM [name], [name], MD   237 mL at 01/15/23 1038   gabapentin (NEURONTIN) capsule 300 mg  300 mg Oral TID [name], [name], MD   300 mg at 01/15/23 1040   haloperidol (HALDOL) tablet 2 mg  2 mg Oral TID [name], [name], MD   2 mg at 01/15/23 1040   haloperidol (HALDOL) tablet 5 mg  5 mg Oral Q6H PRN [name],  [name], MD   5 mg at 01/13/23 1939   Or   haloperidol lactate (HALDOL) injection 5 mg  5 mg Intramuscular Q6H PRN [name], [name], MD   5 mg at 01/01/23 2252   lisinopril (ZESTRIL) tablet 5 mg  5 mg Oral Daily [name], [name], MD   5 mg at 01/15/23 [id number]   magnesium hydroxide (MILK OF MAGNESIA) suspension 30 mL  30 mL Oral Daily PRN [name], [name], MD   30 mL at 12/03/22 1526   metoprolol succinate (TOPROL-XL) 24 hr tablet 25 mg  25 mg Oral Daily [name], [name], MD   25 mg at 01/15/23 0828   ondansetron (ZOFRAN) tablet 4 mg  4 mg Oral Q8H PRN Deloria Lair, NP   4 mg at 09/06/22 2320   paliperidone (INVEGA SUSTENNA) injection 156 mg  156 mg Intramuscular Q28 days [name], [name], MD   156 mg at 12/18/22 1654   paliperidone (INVEGA) 24 hr tablet 6 mg  6 mg Oral QHS [name], [name], MD   6 mg at 01/14/23 2045   senna-docusate (Senokot-S) tablet 2 tablet  2 tablet Oral QHS PRN [name], DO   2 tablet at 09/15/22 2110   temazepam (RESTORIL) capsule 15 mg  15 mg Oral QHS [name], [name], MD   15 mg at 01/14/23 2045   ziprasidone (GEODON) injection 20 mg  20 mg Intramuscular Q12H PRN [name], [name], MD   20 mg at 07/08/22 1453    Lab Results:  Results for orders placed or performed during the hospital encounter of 03/19/22 (from the past 48 hour(s))  CBC with Differential/Platelet     Status: Abnormal   Collection Time: 01/15/23 12:28 PM  Result Value Ref Range   WBC 5.1 4.0 - 10.5 K/uL   RBC 4.34 4.22 - 5.81 MIL/uL   Hemoglobin 13.5 13.0 - 17.0 g/dL   HCT 38.9 (L) 39.0 - 52.0 %   MCV 89.6 80.0 - 100.0 fL   MCH 31.1 26.0 - 34.0 pg   MCHC 34.7 30.0 - 36.0 g/dL   RDW 12.8 11.5 - 15.5 %   Platelets 180 150 - 400 K/uL   nRBC 0.0 0.0 - 0.2 %   Neutrophils Relative % 66 %   Neutro Abs 3.4 1.7 - 7.7 K/uL   Lymphocytes Relative 21 %   Lymphs Abs 1.1 0.7 - 4.0 K/uL  Monocytes Relative 9 %   Monocytes Absolute 0.4 0.1 - 1.0 K/uL   Eosinophils Relative 1 %   Eosinophils  Absolute 0.0 0.0 - 0.5 K/uL   Basophils Relative 3 %   Basophils Absolute 0.2 (H) 0.0 - 0.1 K/uL   Immature Granulocytes 0 %   Abs Immature Granulocytes 0.01 0.00 - 0.07 K/uL    Comment: Performed at [facility], [address] [place], [place] [address]    Blood Alcohol level:  Lab Results  Component Value Date   [facility] <10 [id number]    Metabolic Disorder Labs: Lab Results  Component Value Date   HGBA1C 5.5 04/10/2022   MPG 111.15 04/10/2022   No results found for: "PROLACTIN" Lab Results  Component Value Date   CHOL 167 11/20/2021   TRIG 107 11/20/2021   HDL 26 (L) 11/20/2021   CHOLHDL 6.4 11/20/2021   VLDL 21 11/20/2021   LDLCALC 120 (H) 11/20/2021    Physical Findings: AIMS: Facial and Oral Movements Muscles of Facial Expression: None, normal Lips and Perioral Area: None, normal Jaw: None, normal Tongue: None, normal,Extremity Movements Upper (arms, wrists, hands, fingers): None, normal Lower (legs, knees, ankles, toes): None, normal, Trunk Movements Neck, shoulders, hips: None, normal, Overall Severity Severity of abnormal movements (highest score from questions above): None, normal Incapacitation due to abnormal movements: None, normal Patient's awareness of abnormal movements (rate only patient's report): No Awareness, Dental Status Current problems with teeth and/or dentures?: No Does patient usually wear dentures?: No  CIWA:    COWS:     Musculoskeletal: Strength & Muscle Tone: within normal limits Gait & Station: normal Patient leans: N/A  Psychiatric Specialty Exam:  Presentation  General Appearance:  Appropriate for Environment; Casual; Neat; Well Groomed  Eye Contact: Fair  Speech: Slow  Speech Volume: Decreased  Handedness: Left   Mood and Affect  Mood: Anxious  Affect: Congruent   Thought Process  Thought Processes: Goal Directed  Descriptions of Associations:Circumstantial  Orientation:Partial  Thought  Content:Scattered  History of Schizophrenia/Schizoaffective disorder:Yes  Duration of Psychotic Symptoms:Greater than six months  Hallucinations:No data recorded Ideas of Reference:None  Suicidal Thoughts:No data recorded Homicidal Thoughts:No data recorded  Sensorium  Memory: Remote Good  Judgment: Fair  Insight: Fair   Materials engineer: Fair  Attention Span: Fair  Recall: [place] [employer] of Knowledge: Fair  Language: Fair   Psychomotor Activity  Psychomotor Activity:No data recorded  Assets  Assets:No data recorded  Sleep  Sleep:No data recorded    Blood pressure 119/82, pulse 98, temperature 98.1 F (36.7 C), temperature source Oral, resp. rate 18, height 5' 8"$  (1.727 m), weight 85.8 kg, SpO2 100 %. Body mass index is 28.76 kg/m.   Treatment Plan Summary: Daily contact with patient to assess and evaluate symptoms and progress in treatment, Medication management, and Plan continue current medications.  [name], DO 01/15/2023, 12:50 PM

## 2023-01-15 NOTE — Progress Notes (Signed)
Pt denies SI/HI/AVH and verbally agrees to approach staff if these become apparent or before harming themselves/others. Rates depression 0/10. Rates anxiety 0/10. Rates pain on face scale a 6. Pt is still labile. Pt will yell loudly and angrily for a bit and then be animated. Pt will do this throughout the day. Scheduled medications administered to pt, per MD orders. RN provided support and encouragement to pt. Q15 min safety checks implemented and continued. Pt safe on the unit. RN will continue to monitor and intervene as needed.  01/15/23 0829  Psych Admission Type (Psych Patients Only)  Admission Status Involuntary  Psychosocial Assessment  Patient Complaints None  Eye Contact Glaring;Staring  Facial Expression Anxious;Animated  Affect Labile  Speech Aphasic  Interaction Intrusive;Demanding;Needy  Motor Activity Slow;Shuffling  Appearance/Hygiene Unremarkable  Behavior Characteristics Cooperative;Irritable;Agitated  Mood Anxious;Labile  Aggressive Behavior  Effect No apparent injury  Thought Process  Coherency Concrete thinking  Content Blaming others  Delusions None reported or observed  Perception WDL  Hallucination None reported or observed  Judgment Impaired  Confusion None  Danger to Self  Current suicidal ideation? Denies  Danger to Others  Danger to Others None reported or observed  Danger to Others Abnormal  Harmful Behavior to others No threats or harm toward other people  Destructive Behavior No threats or harm toward property

## 2023-01-15 NOTE — BHH Group Notes (Signed)
[place] Group Notes:  (Nursing/MHT/Case Management/Adjunct)  Date:  01/15/2023  Time:  8:08 PM  Type of Therapy:   Wrap up  Participation Level:  Active  Participation Quality:  Appropriate  Affect:  Appropriate  Cognitive:  Alert  Insight:  Good  Engagement in Group:  Engaged and he just said his name.  Modes of Intervention:  Support  Summary of Progress/Problems:  Adam [name] 01/15/2023, 8:08 PM

## 2023-01-15 NOTE — Group Note (Signed)
Recreation Therapy Group Note   Group Topic:Problem Solving  Group Date: 01/15/2023 Start Time: 1000 End Time: 1055 Facilitators: [name], LRT, CTRS Location:  Craft Room  Group Description: Life Boat. Patients were given the scenario that they are on a boat that is about to become shipwrecked, leaving them stranded on an [place]. They are asked to make a list of 15 different items that they want to take with them when they are stranded on the [place]. Patients are asked to rank their items from most important to least important, #1 being the most important and #15 being the least. Patients will work individually for the first round to come up with 15 items and then pair up with a peer(s) to condense their list and come up with one list of 15 items between the two of them. Patients or LRT will read aloud the 15 different items to the group after each round.   Affect/Mood: Appropriate   Participation Level: Minimal   Participation Quality: Independent   Behavior: Alert and Appropriate   Speech/Thought Process: Relevant   Insight: Fair   Judgement: Fair    Modes of Intervention: Activity and Group work   Patient Response to Interventions:  Attentive   Education Outcome:  In group clarification offered    Clinical Observations/Individualized Feedback: Tyberius was minimally active in their participation of session activities and group discussion. Pt identified "food" as their number one most important item that they will bring on the [place] with them. Pt wrote "Jonny" as the second item. Pt did not engage with peers in group discussion and left group about half way through. Pt was calm and quiet during the time that he was in group.    Plan: Continue to engage patient in RT group sessions 2-3x/week.   [name], LRT, CTRS 01/15/2023 11:10 AM

## 2023-01-15 NOTE — Progress Notes (Addendum)
Patient agitated this shift, aggressive, intrusive. Yelling on unit, glaring in nurses station banging on windows and doors at nurses station. Several attempts at redirecting. Patient loud in milieu intrusive to staff and peers. Yelling Hair Hair Hair, not able to redirect. Prns given for agitation and anxiety with good relief.  Support provided. Safety checks maintained. Medications given as prescribed.

## 2023-01-15 NOTE — Plan of Care (Signed)
  Problem: Education: Goal: Knowledge of General Education information will improve Description: Including pain rating scale, medication(s)/side effects and non-pharmacologic comfort measures Outcome: Progressing   Problem: Elimination: Goal: Will not experience complications related to bowel motility Outcome: Progressing   Problem: Safety: Goal: Ability to remain free from injury will improve Outcome: Progressing   Problem: Self-Concept: Goal: Level of anxiety will decrease Outcome: Not Progressing   Problem: Coping: Goal: Level of anxiety will decrease Outcome: Not Progressing

## 2023-01-16 NOTE — BHH Counselor (Signed)
CSW team attended weekly meeting to address patient's continued stay on BMU.  Attendees:  Rhett Bannister. And Angelique B with [facility] Thana Farr. from [place]., Rebeca Alert and Miquel Dunn F. with [facility] CSW team Pearlean Brownie Supervisor Baxter Hire CNO of BMU Riley Lam Nurse Manager  During the meeting it was discussed why patient has and continues to be declined from placement facilities.  CSW team reviewed that messages that patient is inappropriate for the facilities in question, lack of MH diagnosis and history of TBI have been leading reasons for patient being denied.    CSW team updated that [facility] has declined patient at this time due to bed being filled.  Mendel Ryder with [facility] updated that [facility] has stopped responding to messages when contact attempts have been made.    Team discussed the need for a plan to be developed if patient has been deemed inappropriate for group homes, family care homes, assisted living, etc.  No plan developed at this time.   Mark with [facility] updated that patient does not seep appropriate for their facility.  He identified that the [facility] facilities are SNF and patient does not seem appropriate for a SNF.  He further updates that "[facility]" have indicated that patient is not to be placed on their priority list.  He reports that he is not sure who specifically makes the determination, however, the patient is on the "deferred" list (non-priority).   Lysbeth Galas requested contact information for leadership at [facility] that makes decisions, and contact information was shared between her and [place].  Lysbeth Galas reports plans to follow up with them and with [facility] legal team.  It was discussed that the state needs to be made more aware of pt's current situation.  Contacts at [facility] were discussed.  Lysbeth Galas reports plans to have a meeting with TOC, Risk Management and [facility] legal team to explore this further and identify next  steps.    Elta Guadeloupe suggested that a [employer] be explored.  CSW reports they will review for any referrals to [facility].    Mendel Ryder reports that she will continue ALF placement.   CSW notes that it was expressed that patient's rights are being violated by being placed on a locked inpatient unit with no real psychiatric diagnosis. CSW further notes that CSW team advocated for patient to not be placed on unit for this reason at admission.  TOC Supervisor Oretha Caprice has been made aware of this situation.    No other concerns expressed. Contact ended without incident.    Assunta Curtis, MSW, LCSW 01/16/2023 11:54 AM

## 2023-01-16 NOTE — Plan of Care (Signed)
  Problem: Self-Concept: Goal: Level of anxiety will decrease Outcome: Progressing   Problem: Education: Goal: Knowledge of General Education information will improve Description: Including pain rating scale, medication(s)/side effects and non-pharmacologic comfort measures Outcome: Progressing   Problem: Coping: Goal: Level of anxiety will decrease Outcome: Progressing   Problem: Pain Managment: Goal: General experience of comfort will improve Outcome: Progressing

## 2023-01-16 NOTE — Group Note (Signed)
LCSW Group Therapy Note  Group Date: 01/16/2023 Start Time: 1300 End Time: 1400   Type of Therapy and Topic:  Group Therapy - Healthy vs Unhealthy Coping Skills  Participation Level:  None   Description of Group The focus of this group was to determine what unhealthy coping techniques typically are used by group members and what healthy coping techniques would be helpful in coping with various problems. Patients were guided in becoming aware of the differences between healthy and unhealthy coping techniques. Patients were asked to identify 2-3 healthy coping skills they would like to learn to use more effectively.  Therapeutic Goals Patients learned that coping is what human beings do all day long to deal with various situations in their lives Patients defined and discussed healthy vs unhealthy coping techniques Patients identified their preferred coping techniques and identified whether these were healthy or unhealthy Patients determined 2-3 healthy coping skills they would like to become more familiar with and use more often. Patients provided support and ideas to each other   Summary of Patient Progress:   Patient unable to participate due to expressive aphasia due to stroke.    Therapeutic Modalities Cognitive Behavioral Therapy Motivational Interviewing  [name] 01/16/2023  2:33 PM

## 2023-01-16 NOTE — Progress Notes (Signed)
BHH/BMU LCSW Progress Note   01/16/2023    12:09 PM  Adam [name]   [id number]   Type of Contact and Topic:  Care Coordination   In care coordination meeting with [facility] and [facility], it was mentioned the patient might be apprpriate for [facility].  Per chart review, Adam [name] [facility] Supervisor pursued IDD state facilities which was determined that the patient DOES NOT have a qualifying diagnosis.     Signed:  [name], MSW, LCSW, LCAS 01/16/2023 12:09 PM

## 2023-01-16 NOTE — Progress Notes (Signed)
Patient calm and pleasant during assessment. Pt denies SI/HI/AVH. Pt observed interacting appropriately with staff and peers on the unit. Pt compliant with medication administration per MD orders. Pt being monitored Q 15 minutes for safety per unit protocol, remains safe on the unit

## 2023-01-16 NOTE — Progress Notes (Signed)
[facility] MD Progress Note  01/16/2023 4:27 PM Adam [name]  MRN:  [id number] Subjective: Follow-up patient with schizophrenia.  No new complaints.  No behavior changes.  Doing well today. 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[place])   Stroke (HCC)   HTN (hypertension)   HLD (hyperlipidemia)   CAD (coronary artery disease)   Chronic systolic CHF (congestive heart failure) (HCC)   Anxiety   Infection of left earlobe  Total Time spent with patient: 30 minutes  Past Psychiatric History: History of schizophrenia  Past Medical History:  Past Medical History:  Diagnosis Date   Myocardial infarction ([place])    Schizophrenia ([place])    Stroke ([place])     Past Surgical History:  Procedure Laterality Date   NO PAST SURGERIES     Family History:  Family History  Problem Relation Age of Onset   Hypertension Mother    Family Psychiatric  History: See previous Social History:  Social History   Substance and Sexual Activity  Alcohol Use Not Currently     Social History   Substance and Sexual Activity  Drug Use Not Currently    Social History   Socioeconomic History   Marital status: Single    Spouse name: Not on file   Number of children: Not on file   Years of education: Not on file   Highest education level: Not on file  Occupational History   Not on file  Tobacco Use   Smoking status: Never    Passive exposure: Never   Smokeless tobacco: Never  Vaping Use   Vaping Use: Unknown  Substance and Sexual Activity   Alcohol use: Not Currently   Drug use: Not Currently   Sexual activity: Not Currently  Other Topics Concern   Not on file  Social History Narrative   Not on file   Social Determinants of Health   Financial Resource Strain: Not on file  Food  Insecurity: Not on file  Transportation Needs: Not on file  Physical Activity: Not on file  Stress: Not on file  Social Connections: Not on file   Additional Social History:  Specify valuables returned:  (none)                      Sleep: Fair  Appetite:  Fair  Current Medications: Current Facility-Administered Medications  Medication Dose Route Frequency Provider Last Rate Last Admin   acetaminophen (TYLENOL) tablet 650 mg  650 mg Oral Q6H PRN Mamie Diiorio T, MD   650 mg at 01/15/23 0829   alum & mag hydroxide-simeth (MAALOX/MYLANTA) 200-200-20 MG/5ML suspension 30 mL  30 mL Oral Q4H PRN Edgel Degnan T, MD   30 mL at 04/26/22 [id number]   aspirin EC tablet 81 mg  81 mg Oral Daily Zamarian Scarano, [name], MD   81 mg at 01/16/23 0906   atorvastatin (LIPITOR) tablet 20 mg  20 mg Oral Daily Ximena Todaro T, MD   20 mg at 01/16/23 0905   atropine 1 % ophthalmic solution 1 drop  1 drop Sublingual QID [name], MD   1 drop at 01/16/23 1143   benztropine (COGENTIN) tablet 0.5 mg  0.5 mg Oral BID Wael Maestas, [name], MD   0.5 mg at 01/16/23 0906   clonazePAM (KLONOPIN) tablet 1 mg  1 mg Oral TID PRN He, [name], MD  1 mg at 01/14/23 2044   cloZAPine (CLOZARIL) tablet 300 mg  300 mg Oral QHS Venera Privott, [name], MD   300 mg at 01/15/23 2107   diphenhydrAMINE (BENADRYL) capsule 50 mg  50 mg Oral Q6H PRN Verena Shawgo, [name], MD   50 mg at 01/14/23 2045   Or   diphenhydrAMINE (BENADRYL) injection 50 mg  50 mg Intramuscular Q6H PRN Ieshia Hatcher, [name], MD   50 mg at 01/01/23 2252   feeding supplement (ENSURE ENLIVE / ENSURE PLUS) liquid 237 mL  237 mL Oral BID BM Jeronimo Hellberg T, MD   237 mL at 01/16/23 1427   gabapentin (NEURONTIN) capsule 300 mg  300 mg Oral TID Nicco Reaume, [name], MD   300 mg at 01/16/23 1427   haloperidol (HALDOL) tablet 2 mg  2 mg Oral TID Deago Burruss, [name], MD   2 mg at 01/16/23 1427   haloperidol (HALDOL) tablet 5 mg  5 mg Oral Q6H PRN Elliona Doddridge, [name], MD   5 mg at 01/13/23 1939   Or    haloperidol lactate (HALDOL) injection 5 mg  5 mg Intramuscular Q6H PRN Kamille Toomey, [name], MD   5 mg at 01/01/23 2252   lisinopril (ZESTRIL) tablet 5 mg  5 mg Oral Daily Lacresha Fusilier, [name], MD   5 mg at 01/15/23 [id number]   magnesium hydroxide (MILK OF MAGNESIA) suspension 30 mL  30 mL Oral Daily PRN Aedyn Kempfer, [name], MD   30 mL at 12/03/22 1526   metoprolol succinate (TOPROL-XL) 24 hr tablet 25 mg  25 mg Oral Daily Shaindel Sweeten T, MD   25 mg at 01/15/23 0828   ondansetron (ZOFRAN) tablet 4 mg  4 mg Oral Q8H PRN Deloria Lair, NP   4 mg at 09/06/22 2320   paliperidone (INVEGA SUSTENNA) injection 156 mg  156 mg Intramuscular Q28 days Elidia Bonenfant, [name], MD   156 mg at 01/15/23 1652   paliperidone (INVEGA) 24 hr tablet 6 mg  6 mg Oral QHS Brando Taves T, MD   6 mg at 01/15/23 2107   senna-docusate (Senokot-S) tablet 2 tablet  2 tablet Oral QHS PRN [name], DO   2 tablet at 09/15/22 2110   temazepam (RESTORIL) capsule 15 mg  15 mg Oral QHS Aida Lemaire T, MD   15 mg at 01/15/23 2107   ziprasidone (GEODON) injection 20 mg  20 mg Intramuscular Q12H PRN Aarica Wax, [name], MD   20 mg at 07/08/22 1453    Lab Results:  Results for orders placed or performed during the hospital encounter of 03/19/22 (from the past 48 hour(s))  CBC with Differential/Platelet     Status: Abnormal   Collection Time: 01/15/23 12:28 PM  Result Value Ref Range   WBC 5.1 4.0 - 10.5 K/uL   RBC 4.34 4.22 - 5.81 MIL/uL   Hemoglobin 13.5 13.0 - 17.0 g/dL   HCT 38.9 (L) 39.0 - 52.0 %   MCV 89.6 80.0 - 100.0 fL   MCH 31.1 26.0 - 34.0 pg   MCHC 34.7 30.0 - 36.0 g/dL   RDW 12.8 11.5 - 15.5 %   Platelets 180 150 - 400 K/uL   nRBC 0.0 0.0 - 0.2 %   Neutrophils Relative % 66 %   Neutro Abs 3.4 1.7 - 7.7 K/uL   Lymphocytes Relative 21 %   Lymphs Abs 1.1 0.7 - 4.0 K/uL   Monocytes Relative 9 %   Monocytes Absolute 0.4 0.1 - 1.0 K/uL  Eosinophils Relative 1 %   Eosinophils Absolute 0.0 0.0 - 0.5 K/uL   Basophils Relative  3 %   Basophils Absolute 0.2 (H) 0.0 - 0.1 K/uL   Immature Granulocytes 0 %   Abs Immature Granulocytes 0.01 0.00 - 0.07 K/uL    Comment: Performed at [facility], [address] [place], [place] [address]    Blood Alcohol level:  Lab Results  Component Value Date   [facility] <10 [id number]    Metabolic Disorder Labs: Lab Results  Component Value Date   HGBA1C 5.5 04/10/2022   MPG 111.15 04/10/2022   No results found for: "PROLACTIN" Lab Results  Component Value Date   CHOL 167 11/20/2021   TRIG 107 11/20/2021   HDL 26 (L) 11/20/2021   CHOLHDL 6.4 11/20/2021   VLDL 21 11/20/2021   LDLCALC 120 (H) 11/20/2021    Physical Findings: AIMS: Facial and Oral Movements Muscles of Facial Expression: None, normal Lips and Perioral Area: None, normal Jaw: None, normal Tongue: None, normal,Extremity Movements Upper (arms, wrists, hands, fingers): None, normal Lower (legs, knees, ankles, toes): None, normal, Trunk Movements Neck, shoulders, hips: None, normal, Overall Severity Severity of abnormal movements (highest score from questions above): None, normal Incapacitation due to abnormal movements: None, normal Patient's awareness of abnormal movements (rate only patient's report): No Awareness, Dental Status Current problems with teeth and/or dentures?: No Does patient usually wear dentures?: No  CIWA:    COWS:     Musculoskeletal: Strength & Muscle Tone: within normal limits Gait & Station: normal Patient leans: N/A  Psychiatric Specialty Exam:  Presentation  General Appearance:  Appropriate for Environment; Casual; Neat; Well Groomed  Eye Contact: Fair  Speech: Slow  Speech Volume: Decreased  Handedness: Left   Mood and Affect  Mood: Anxious  Affect: Congruent   Thought Process  Thought Processes: Goal Directed  Descriptions of Associations:Circumstantial  Orientation:Partial  Thought Content:Scattered  History of  Schizophrenia/Schizoaffective disorder:Yes  Duration of Psychotic Symptoms:Greater than six months  Hallucinations:No data recorded Ideas of Reference:None  Suicidal Thoughts:No data recorded Homicidal Thoughts:No data recorded  Sensorium  Memory: Remote Good  Judgment: Fair  Insight: Fair   Community education officer  Concentration: Fair  Attention Span: Fair  Recall: [employer] of Knowledge: Fair  Language: Fair   Psychomotor Activity  Psychomotor Activity:No data recorded  Assets  Assets:No data recorded  Sleep  Sleep:No data recorded   Physical Exam: Physical Exam Vitals and nursing note reviewed.  Constitutional:      Appearance: Normal appearance.  HENT:     Head: Normocephalic and atraumatic.     Mouth/Throat:     Pharynx: Oropharynx is clear.  Eyes:     Pupils: Pupils are equal, round, and reactive to light.  Cardiovascular:     Rate and Rhythm: Normal rate and regular rhythm.  Pulmonary:     Effort: Pulmonary effort is normal.     Breath sounds: Normal breath sounds.  Abdominal:     General: Abdomen is flat.     Palpations: Abdomen is soft.  Musculoskeletal:        General: Normal range of motion.  Skin:    General: Skin is warm and dry.  Neurological:     General: No focal deficit present.     Mental Status: He is alert. Mental status is at baseline.  Psychiatric:        Attention and Perception: He is inattentive.        Mood and Affect: Affect  is labile.        Speech: He is noncommunicative.    Review of Systems  Unable to perform ROS: Language   Blood pressure 104/70, pulse (!) 107, temperature 98.1 F (36.7 C), temperature source Oral, resp. rate 18, height 5' 8"$  (1.727 m), weight 85.8 kg, SpO2 96 %. Body mass index is 28.76 kg/m.   Treatment Plan Summary: Plan absolutely nothing different nothing new no problems whatsoever with this patient who is here entirely for the sake of waiting for placement.  Alethia Berthold,  MD 01/16/2023, 4:27 PM

## 2023-01-16 NOTE — Progress Notes (Signed)
Pt denies SI/HI/AVH and verbally agrees to approach staff if these become apparent or before harming themselves/others. Rates depression 0/10. Rates anxiety 0/10. Rates pain 0/10.  Scheduled medications administered to pt, per MD orders. RN provided support and encouragement to pt. Q15 min safety checks implemented and continued. Pt safe on the unit. RN will continue to monitor and intervene as needed.  01/16/23 0900  Psych Admission Type (Psych Patients Only)  Admission Status Involuntary  Psychosocial Assessment  Patient Complaints None  Eye Contact Glaring;Staring  Facial Expression Anxious  Affect Labile  Speech Aphasic  Interaction Demanding;Needy  Motor Activity Slow;Shuffling  Appearance/Hygiene Unremarkable;Improved  Behavior Characteristics Cooperative;Irritable  Mood Anxious;Labile  Aggressive Behavior  Effect No apparent injury  Thought Process  Coherency Concrete thinking  Content Blaming others  Delusions None reported or observed  Perception WDL  Hallucination None reported or observed  Judgment Impaired  Confusion None  Danger to Self  Current suicidal ideation? Denies  Danger to Others  Danger to Others None reported or observed

## 2023-01-16 NOTE — BHH Counselor (Signed)
CSW contacted the patient's mother to discuss patient becoming a resident at her ALF.  Mother adamantaly states "no" when asked thoughts about patient becoming a resident at her faility.  She reports that the facility "is not a place I would even go".  She identifies the following as barriers to patient being appropriate for facility: "not confined, overpopulated, he would get into trouble here, there are no alarms on the doors, he can't be in an area with very ill people and the ambulance comes daily to the facility, he knows the trouble spots a for drugs from previous stays".  CSW poised the question, several times, if the concern not that patient would be appropriate for the facility but a matter of mother being uncomfortable or personal preference that patient not be at the facility.  Mother declined to answer the question and would not engage in discussion on it, repeating her earlier concerns.    CSW inquired about patient's stay at [facility]. Mother reports that the patent stayed there "the longest he's ever been anywhere" when CSW attempted to explore this further mother stated "I don't know, I guess a year and a half".  She reports that the patient had a "great rapport" and did "extremely well" at this facility.  Mother suggested "there was a new hospital that just opened in [place]" and she thinks pt may be appropriate.  She reports that she will look into it.   CSW discussed with mother that patient is not appropriate for hospital level of care and though hospitalized for several years, he can be successful at a lower level of care,  CSW did attempt to call [facility] to again check if patient can go to this facility.  CSW unable to speak with staff or leave message at voicemail box was full.  Assunta Curtis, MSW, LCSW 01/16/2023 3:20 PM

## 2023-01-16 NOTE — BHH Group Notes (Signed)
[place] Group Notes:  (Nursing/MHT/Case Management/Adjunct)  Date:  01/16/2023  Time:  4:28 PM  Type of Therapy:  Psychoeducational Skills  Participation Level:  Active  Participation Quality:  Appropriate  Affect:  Appropriate  Cognitive:  Appropriate  Insight:  Appropriate  Engagement in Group:  Engaged  Modes of Intervention:  Activity  Summary of Progress/Problems:  Adela Lank [facility] 01/16/2023, 4:28 PM

## 2023-01-16 NOTE — Group Note (Signed)
Recreation Therapy Group Note   Group Topic:Self-Esteem  Group Date: 01/16/2023 Start Time: 1000 End Time: 1035 Facilitators: [name], LRT, CTRS Location:  Dayroom  Group Description: Patients and LRT discussed the importance of self-love and self-esteem. Pt completed a worksheet that helps them identify 24 different strengths and qualities about themselves. Pt encouraged to read aloud at least 3 off their sheet to the group. Pt's then had the choice to play "Positive Affirmation Bingo" afterwards, with journals or stress balls as bingo prizes.   Affect/Mood: N/A   Participation Level: Did not attend    Clinical Observations/Individualized Feedback: Mj came into the group room for about 5 minutes before leaving. He was not present for any conversation had by LRT and other pts. Bellamy was yelling out and making noises that were loud and distracting peers during those 5 minutes he was in group. Pt asked to keep the volume down and pt did not seem receptive. Pt got up and left the room and did not return.   Plan: Continue to engage patient in RT group sessions 2-3x/week.   [name], LRT, CTRS 01/16/2023 11:00 AM

## 2023-01-16 NOTE — BHH Counselor (Signed)
CSW attempted to call patient's mother.  Mother reports that she is in a conference and will return call. Call was to discuss possibility of patient going to her ALF.  Assunta Curtis, MSW, LCSW 01/16/2023 2:31 PM

## 2023-01-16 NOTE — BHH Group Notes (Signed)
[place] Group Notes:  (Nursing/MHT/Case Management/Adjunct)  Date:  01/16/2023  Time:  9:50 AM  Type of Therapy:   Community Meeting  Participation Level:  Active  Participation Quality:  Appropriate and Attentive  Affect:  Appropriate  Cognitive:  Alert and Appropriate  Insight:  Appropriate  Engagement in Group:  Engaged  Modes of Intervention:  Discussion and Education  Summary of Progress/Problems:  Adam [name] 01/16/2023, 9:50 AM

## 2023-01-17 NOTE — Plan of Care (Signed)
Patient appropriate with staff & peers. Denies SI,HI and AVH. Patient at the nurses station multiple times for various need. Compliant with medications. Appetite and energy level good. ADLs maintained. Support and encouragement given.

## 2023-01-17 NOTE — Progress Notes (Signed)
[facility] MD Progress Note  01/17/2023 2:42 PM Kutter Parrott  MRN:  [id number] Subjective:  Patient with schizophrenia.  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[place])   Stroke (HCC)   HTN (hypertension)   HLD (hyperlipidemia)   CAD (coronary artery disease)   Chronic systolic CHF (congestive heart failure) (HCC)   Anxiety   Infection of left earlobe  Total Time spent with patient: 15 minutes  Past Psychiatric History: Past history of schizophrenia  Past Medical History:  Past Medical History:  Diagnosis Date   Myocardial infarction ([place])    Schizophrenia ([place])    Stroke ([place])     Past Surgical History:  Procedure Laterality Date   NO PAST SURGERIES     Family History:  Family History  Problem Relation Age of Onset   Hypertension Mother    Family Psychiatric  History: None Social History:  Social History   Substance and Sexual Activity  Alcohol Use Not Currently     Social History   Substance and Sexual Activity  Drug Use Not Currently    Social History   Socioeconomic History   Marital status: Single    Spouse name: Not on file   Number of children: Not on file   Years of education: Not on file   Highest education level: Not on file  Occupational History   Not on file  Tobacco Use   Smoking status: Never    Passive exposure: Never   Smokeless tobacco: Never  Vaping Use   Vaping Use: Unknown  Substance and Sexual Activity   Alcohol use: Not Currently   Drug use: Not Currently   Sexual activity: Not Currently  Other Topics Concern   Not on file  Social History Narrative   Not on file   Social Determinants of Health   Financial Resource Strain: Not on file  Food Insecurity: Not on file   Transportation Needs: Not on file  Physical Activity: Not on file  Stress: Not on file  Social Connections: Not on file   Additional Social History:  Specify valuables returned:  (none)                      Sleep: Fair  Appetite:  Fair  Current Medications: Current Facility-Administered Medications  Medication Dose Route Frequency Provider Last Rate Last Admin   acetaminophen (TYLENOL) tablet 650 mg  650 mg Oral Q6H PRN Vontrell Pullman T, MD   650 mg at 01/15/23 0829   alum & mag hydroxide-simeth (MAALOX/MYLANTA) 200-200-20 MG/5ML suspension 30 mL  30 mL Oral Q4H PRN Fong Mccarry T, MD   30 mL at 04/26/22 [id number]   aspirin EC tablet 81 mg  81 mg Oral Daily Arvid Marengo, [name], MD   81 mg at 01/17/23 0855   atorvastatin (LIPITOR) tablet 20 mg  20 mg Oral Daily Topanga Alvelo T, MD   20 mg at 01/17/23 0855   atropine 1 % ophthalmic solution 1 drop  1 drop Sublingual QID [name], MD   1 drop at 01/17/23 1156   benztropine (COGENTIN) tablet 0.5 mg  0.5 mg Oral BID Calandra Madura T, MD   0.5 mg at 01/17/23 0855   clonazePAM (KLONOPIN) tablet 1 mg  1 mg Oral TID PRN He, [name], MD   1 mg at 01/17/23  1035   cloZAPine (CLOZARIL) tablet 300 mg  300 mg Oral QHS Arsal Tappan T, MD   300 mg at 01/16/23 2112   diphenhydrAMINE (BENADRYL) capsule 50 mg  50 mg Oral Q6H PRN Misk Galentine, [name], MD   50 mg at 01/14/23 2045   Or   diphenhydrAMINE (BENADRYL) injection 50 mg  50 mg Intramuscular Q6H PRN Catherine Oak T, MD   50 mg at 01/01/23 2252   feeding supplement (ENSURE ENLIVE / ENSURE PLUS) liquid 237 mL  237 mL Oral BID BM Bethann Qualley T, MD   237 mL at 01/17/23 0900   gabapentin (NEURONTIN) capsule 300 mg  300 mg Oral TID Leondre Taul T, MD   300 mg at 01/17/23 0855   haloperidol (HALDOL) tablet 2 mg  2 mg Oral TID Jadia Capers, [name], MD   2 mg at 01/17/23 0854   haloperidol (HALDOL) tablet 5 mg  5 mg Oral Q6H PRN Dunia Pringle, [name], MD   5 mg at 01/13/23 1939   Or   haloperidol lactate (HALDOL)  injection 5 mg  5 mg Intramuscular Q6H PRN Zhane Bluitt T, MD   5 mg at 01/01/23 2252   lisinopril (ZESTRIL) tablet 5 mg  5 mg Oral Daily Judyth Demarais T, MD   5 mg at 01/17/23 0855   magnesium hydroxide (MILK OF MAGNESIA) suspension 30 mL  30 mL Oral Daily PRN Emigdio Wildeman T, MD   30 mL at 12/03/22 1526   metoprolol succinate (TOPROL-XL) 24 hr tablet 25 mg  25 mg Oral Daily Nylen Creque T, MD   25 mg at 01/17/23 0854   ondansetron (ZOFRAN) tablet 4 mg  4 mg Oral Q8H PRN [name], NP   4 mg at 09/06/22 2320   paliperidone (INVEGA SUSTENNA) injection 156 mg  156 mg Intramuscular Q28 days Aalyssa Elderkin T, MD   156 mg at 01/15/23 1652   paliperidone (INVEGA) 24 hr tablet 6 mg  6 mg Oral QHS Charyl Minervini T, MD   6 mg at 01/16/23 2112   senna-docusate (Senokot-S) tablet 2 tablet  2 tablet Oral QHS PRN [name], DO   2 tablet at 09/15/22 2110   temazepam (RESTORIL) capsule 15 mg  15 mg Oral QHS Chianti Goh T, MD   15 mg at 01/16/23 2112   ziprasidone (GEODON) injection 20 mg  20 mg Intramuscular Q12H PRN Rumaldo Difatta, [name], MD   20 mg at 07/08/22 1453    Lab Results: No results found for this or any previous visit (from the past 48 hour(s)).  Blood Alcohol level:  Lab Results  Component Value Date   ETH <10 [id number]    Metabolic Disorder Labs: Lab Results  Component Value Date   HGBA1C 5.5 04/10/2022   MPG 111.15 04/10/2022   No results found for: "PROLACTIN" Lab Results  Component Value Date   CHOL 167 11/20/2021   TRIG 107 11/20/2021   HDL 26 (L) 11/20/2021   CHOLHDL 6.4 11/20/2021   VLDL 21 11/20/2021   LDLCALC 120 (H) 11/20/2021    Physical Findings: AIMS: Facial and Oral Movements Muscles of Facial Expression: None, normal Lips and Perioral Area: None, normal Jaw: None, normal Tongue: None, normal,Extremity Movements Upper (arms, wrists, hands, fingers): None, normal Lower (legs, knees, ankles, toes): None, normal, Trunk Movements Neck,  shoulders, hips: None, normal, Overall Severity Severity of abnormal movements (highest score from questions above): None, normal Incapacitation due to abnormal movements: None, normal Patient's awareness of  abnormal movements (rate only patient's report): No Awareness, Dental Status Current problems with teeth and/or dentures?: No Does patient usually wear dentures?: No  CIWA:    COWS:     Musculoskeletal: Strength & Muscle Tone: within normal limits Gait & Station: normal Patient leans: N/A  Psychiatric Specialty Exam:  Presentation  General Appearance:  Appropriate for Environment; Casual; Neat; Well Groomed  Eye Contact: Fair  Speech: Slow  Speech Volume: Decreased  Handedness: Left   Mood and Affect  Mood: Anxious  Affect: Congruent   Thought Process  Thought Processes: Goal Directed  Descriptions of Associations:Circumstantial  Orientation:Partial  Thought Content:Scattered  History of Schizophrenia/Schizoaffective disorder:Yes  Duration of Psychotic Symptoms:Greater than six months  Hallucinations:No data recorded Ideas of Reference:None  Suicidal Thoughts:No data recorded Homicidal Thoughts:No data recorded  Sensorium  Memory: Remote Good  Judgment: Fair  Insight: Fair   Community education officer  Concentration: Fair  Attention Span: Fair  Recall: [employer] of Knowledge: Fair  Language: Fair   Psychomotor Activity  Psychomotor Activity:No data recorded  Assets  Assets:No data recorded  Sleep  Sleep:No data recorded   Physical Exam: Physical Exam Vitals and nursing note reviewed.  Constitutional:      Appearance: Normal appearance.  HENT:     Head: Normocephalic and atraumatic.     Mouth/Throat:     Pharynx: Oropharynx is clear.  Eyes:     Pupils: Pupils are equal, round, and reactive to light.  Cardiovascular:     Rate and Rhythm: Normal rate and regular rhythm.  Pulmonary:     Effort: Pulmonary effort  is normal.     Breath sounds: Normal breath sounds.  Abdominal:     General: Abdomen is flat.     Palpations: Abdomen is soft.  Musculoskeletal:        General: Normal range of motion.  Skin:    General: Skin is warm and dry.  Neurological:     General: No focal deficit present.     Mental Status: He is alert. Mental status is at baseline.  Psychiatric:        Attention and Perception: He is inattentive.        Mood and Affect: Mood normal. Affect is blunt.        Speech: He is noncommunicative.    Review of Systems  Unable to perform ROS: Language  Constitutional: Negative.   HENT: Negative.    Eyes: Negative.   Respiratory: Negative.    Cardiovascular: Negative.   Gastrointestinal: Negative.   Musculoskeletal: Negative.   Skin: Negative.   Neurological: Negative.    Blood pressure 115/83, pulse (!) 108, temperature 98.8 F (37.1 C), temperature source Oral, resp. rate 18, height 5' 8"$  (1.727 m), weight 85.8 kg, SpO2 100 %. Body mass index is 28.76 kg/m.   Treatment Plan Summary: Plan waiting for discharge  Alethia Berthold, MD 01/17/2023, 2:42 PM

## 2023-01-17 NOTE — Group Note (Unsigned)
Date:  01/17/2023 Time:  9:38 PM  Group Topic/Focus:  Wrap-Up Group:   The focus of this group is to help patients review their daily goal of treatment and discuss progress on daily workbooks.     Participation Level:  {BHH PARTICIPATION WO:[id number]  Participation Quality:  {BHH PARTICIPATION QUALITY:22265}  Affect:  {BHH AFFECT:22266}  Cognitive:  {BHH COGNITIVE:22267}  Insight: {BHH Insight2:20797}  Engagement in Group:  {BHH ENGAGEMENT IN BP:[id number]  Modes of Intervention:  {BHH MODES OF INTERVENTION:22269}  Additional Comments:  ***  Adam [name] 01/17/2023, 9:38 PM

## 2023-01-17 NOTE — Group Note (Signed)
Recreation Therapy Group Note   Group Topic:Relaxation  Group Date: 01/17/2023 Start Time: 1000 End Time: 1045 Facilitators: [name], LRT, CTRS Location:  Craft Room  Group Description: PMR (Progressive Muscle Relaxation). LRT asks patients their current level of stress/anxiety from 1-10, with 10 being the highest. LRT educates patients on what PMR is and the benefits that come from it. Patients are asked to sit with their feet flat on the floor while sitting up and all the way back in their chair, if possible. LRT follows prompt that requires the patients to tense and release different muscles in their body and focus on their breathing. During session, lights are off and soft music is being played. At the end of the prompt, LRT asks patients to rank their current levels of stress/anxiety from 1-10, 10 being the highest.   Affect/Mood: N/A   Participation Level: Did not attend    Clinical Observations/Individualized Feedback: Adam [name] came into the craft room for 2 minutes before getting up and leaving. Pt was present for no education on PMR or exercises.   Plan: Continue to engage patient in RT group sessions 2-3x/week.   [name], LRT, CTRS 01/17/2023 10:53 AM

## 2023-01-17 NOTE — Group Note (Signed)
[facility] LCSW Group Therapy Note   Group Date: 01/17/2023 Start Time: 1300 End Time: 1400   Type of Therapy/Topic:  Group Therapy:  Balance in Life  Participation Level:  Minimal   Description of Group:    This group will address the concept of balance and how it feels and looks when one is unbalanced. Patients will be encouraged to process areas in their lives that are out of balance, and identify reasons for remaining unbalanced. Facilitators will guide patients utilizing problem- solving interventions to address and correct the stressor making their life unbalanced. Understanding and applying boundaries will be explored and addressed for obtaining  and maintaining a balanced life. Patients will be encouraged to explore ways to assertively make their unbalanced needs known to significant others in their lives, using other group members and facilitator for support and feedback.  Therapeutic Goals: Patient will identify two or more emotions or situations they have that consume much of in their lives. Patient will identify signs/triggers that life has become out of balance:  Patient will identify two ways to set boundaries in order to achieve balance in their lives:  Patient will demonstrate ability to communicate their needs through discussion and/or role plays  Summary of Patient Progress: Patient was present for the majority of the group process. His contribution to group today was "[employer]" and "everything".    Therapeutic Modalities:   Cognitive Behavioral Therapy Solution-Focused Therapy Assertiveness Training   Shirl Harris, LCSW

## 2023-01-17 NOTE — Progress Notes (Signed)
Patient calm and pleasant during assessment. Pt denies SI/HI/AVH. Pt observed interacting appropriately with staff and peers on the unit. Pt compliant with medication administration per MD orders. Pt being monitored Q 15 minutes for safety per unit protocol, remains safe on the unit

## 2023-01-18 NOTE — Group Note (Signed)
Recreation Therapy Group Note   Group Topic:Communication  Group Date: 01/18/2023 Start Time: 1000 End Time: [place] Facilitators: [name], LRT, CTRS Location:  Craft Room  Group Description: Straw Bridge. In pairs, patients were given 10 plastic drinking straws and an equal length of masking tape. Using the materials provided, patients were instructed to build a free-standing bridge-like structure to suspend an everyday item (ex: puzzle box) off of the floor or table surface. All materials were required to be used by the team in their design. LRT facilitated post-activity discussion reviewing team process. Patients were encouraged to reflect how the skills used in this activity can be generalized to daily life post discharge.   Affect/Mood: Flat   Participation Level: Minimal   Participation Quality: Minimal Cues   Behavior: Disinterested   Speech/Thought Process: Loud   Insight: Limited   Judgement: Limited   Modes of Intervention: Activity and Group work   Patient Response to Interventions:  Disengaged   Education Outcome:  In group clarification offered    Clinical Observations/Individualized Feedback: Adam [name] was minimally active in their participation of session activities and group discussion. Pt chose to work independently at his own table. Pt was yelling out multiple times throughout group and required redirection and cues to lower his volume as he was distracting peers. Pt taped all 10 straws together, which looked like a flower bouquet. Pt left group early on his own and did not return.   Plan: Continue to engage patient in RT group sessions 2-3x/week.   [name], LRT, CTRS 01/18/2023 12:02 PM

## 2023-01-18 NOTE — Progress Notes (Signed)
Patient refused to let this writer obtain a set of vitals on him this morning. However, he did take his scheduled medication. Patient tolerated medication well, without any issues. MD was made aware during progression rounds, and is ok with this.

## 2023-01-18 NOTE — BHH Group Notes (Signed)
[place] Group Notes:  (Nursing/MHT/Case Management/Adjunct)  Date:  01/18/2023  Time:  3:06 PM  Type of Therapy:  Psychoeducational Skills  Participation Level:  Active  Participation Quality:  Appropriate  Affect:  Appropriate  Cognitive:  Appropriate  Insight:  Appropriate  Engagement in Group:  Engaged  Modes of Intervention:  Activity  Summary of Progress/Problems:  Adam [name] [facility] 01/18/2023, 3:06 PM

## 2023-01-18 NOTE — BHH Group Notes (Signed)
[place] Group Notes:  (Nursing/MHT/Case Management/Adjunct)  Date:  01/18/2023  Time:  8:39 PM  Type of Therapy:   Wrap up  Participation Level:  Did Not Attend  Summary of Progress/Problems:  Adam [name] 01/18/2023, 8:39 PM

## 2023-01-18 NOTE — Progress Notes (Signed)
[facility] MD Progress Note  01/18/2023 11:12 AM Adam [name]  MRN:  [id number] Subjective: Follow-up this gentleman with schizophrenia.  No new complaints.  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[place])   Stroke (HCC)   HTN (hypertension)   HLD (hyperlipidemia)   CAD (coronary artery disease)   Chronic systolic CHF (congestive heart failure) (HCC)   Anxiety   Infection of left earlobe  Total Time spent with patient: 15 minutes  Past Psychiatric History: History of schizophrenia  Past Medical History:  Past Medical History:  Diagnosis Date   Myocardial infarction ([place])    Schizophrenia ([place])    Stroke ([place])     Past Surgical History:  Procedure Laterality Date   NO PAST SURGERIES     Family History:  Family History  Problem Relation Age of Onset   Hypertension Mother    Family Psychiatric  History: Absolutely no new information Social History:  Social History   Substance and Sexual Activity  Alcohol Use Not Currently     Social History   Substance and Sexual Activity  Drug Use Not Currently    Social History   Socioeconomic History   Marital status: Single    Spouse name: Not on file   Number of children: Not on file   Years of education: Not on file   Highest education level: Not on file  Occupational History   Not on file  Tobacco Use   Smoking status: Never    Passive exposure: Never   Smokeless tobacco: Never  Vaping Use   Vaping Use: Unknown  Substance and Sexual Activity   Alcohol use: Not Currently   Drug use: Not Currently   Sexual activity: Not Currently  Other Topics Concern   Not on file  Social History Narrative   Not on file   Social Determinants of Health   Financial Resource Strain: Not on file  Food  Insecurity: Not on file  Transportation Needs: Not on file  Physical Activity: Not on file  Stress: Not on file  Social Connections: Not on file   Additional Social History:  Specify valuables returned:  (none)                      Sleep: Fair  Appetite:  Fair  Current Medications: Current Facility-Administered Medications  Medication Dose Route Frequency Provider Last Rate Last Admin   acetaminophen (TYLENOL) tablet 650 mg  650 mg Oral Q6H PRN Jaelynne Hockley T, MD   650 mg at 01/18/23 0819   alum & mag hydroxide-simeth (MAALOX/MYLANTA) 200-200-20 MG/5ML suspension 30 mL  30 mL Oral Q4H PRN Shaquita Fort T, MD   30 mL at 04/26/22 [id number]   aspirin EC tablet 81 mg  81 mg Oral Daily Durinda Buzzelli, [name], MD   81 mg at 01/18/23 0818   atorvastatin (LIPITOR) tablet 20 mg  20 mg Oral Daily Kyal Arts T, MD   20 mg at 01/18/23 0818   atropine 1 % ophthalmic solution 1 drop  1 drop Sublingual QID [name], MD   1 drop at 01/18/23 1105   benztropine (COGENTIN) tablet 0.5 mg  0.5 mg Oral BID Paden Senger T, MD   0.5 mg at 01/18/23 0818   clonazePAM (KLONOPIN) tablet 1 mg  1 mg Oral TID PRN He, [name], MD  1 mg at 01/17/23 1035   cloZAPine (CLOZARIL) tablet 300 mg  300 mg Oral QHS Lynze Reddy T, MD   300 mg at 01/17/23 2109   diphenhydrAMINE (BENADRYL) capsule 50 mg  50 mg Oral Q6H PRN Lancelot Alyea, [name], MD   50 mg at 01/14/23 2045   Or   diphenhydrAMINE (BENADRYL) injection 50 mg  50 mg Intramuscular Q6H PRN Aimee Heldman T, MD   50 mg at 01/01/23 2252   feeding supplement (ENSURE ENLIVE / ENSURE PLUS) liquid 237 mL  237 mL Oral BID BM Broox Lonigro T, MD   237 mL at 01/18/23 1004   gabapentin (NEURONTIN) capsule 300 mg  300 mg Oral TID Pearse Shiffler T, MD   300 mg at 01/18/23 0900   haloperidol (HALDOL) tablet 2 mg  2 mg Oral TID Linkon Siverson T, MD   2 mg at 01/18/23 0900   haloperidol (HALDOL) tablet 5 mg  5 mg Oral Q6H PRN Kalyssa Anker, [name], MD   5 mg at 01/13/23 1939   Or    haloperidol lactate (HALDOL) injection 5 mg  5 mg Intramuscular Q6H PRN Lashante Fryberger T, MD   5 mg at 01/01/23 2252   lisinopril (ZESTRIL) tablet 5 mg  5 mg Oral Daily Bailyn Spackman T, MD   5 mg at 01/18/23 [id number]   magnesium hydroxide (MILK OF MAGNESIA) suspension 30 mL  30 mL Oral Daily PRN Devrin Monforte T, MD   30 mL at 12/03/22 1526   metoprolol succinate (TOPROL-XL) 24 hr tablet 25 mg  25 mg Oral Daily Jamarius Saha T, MD   25 mg at 01/18/23 0818   ondansetron (ZOFRAN) tablet 4 mg  4 mg Oral Q8H PRN [name], NP   4 mg at 09/06/22 2320   paliperidone (INVEGA SUSTENNA) injection 156 mg  156 mg Intramuscular Q28 days Akram Kissick T, MD   156 mg at 01/15/23 1652   paliperidone (INVEGA) 24 hr tablet 6 mg  6 mg Oral QHS Rafiq Bucklin T, MD   6 mg at 01/17/23 2110   senna-docusate (Senokot-S) tablet 2 tablet  2 tablet Oral QHS PRN [name], DO   2 tablet at 09/15/22 2110   temazepam (RESTORIL) capsule 15 mg  15 mg Oral QHS Chanta Bauers T, MD   15 mg at 01/17/23 2109   ziprasidone (GEODON) injection 20 mg  20 mg Intramuscular Q12H PRN Trinidee Schrag, [name], MD   20 mg at 07/08/22 1453    Lab Results: No results found for this or any previous visit (from the past 48 hour(s)).  Blood Alcohol level:  Lab Results  Component Value Date   ETH <10 A999333    Metabolic Disorder Labs: Lab Results  Component Value Date   HGBA1C 5.5 04/10/2022   MPG 111.15 04/10/2022   No results found for: "PROLACTIN" Lab Results  Component Value Date   CHOL 167 11/20/2021   TRIG 107 11/20/2021   HDL 26 (L) 11/20/2021   CHOLHDL 6.4 11/20/2021   VLDL 21 11/20/2021   LDLCALC 120 (H) 11/20/2021    Physical Findings: AIMS: Facial and Oral Movements Muscles of Facial Expression: None, normal Lips and Perioral Area: None, normal Jaw: None, normal Tongue: None, normal,Extremity Movements Upper (arms, wrists, hands, fingers): None, normal Lower (legs, knees, ankles, toes): None, normal,  Trunk Movements Neck, shoulders, hips: None, normal, Overall Severity Severity of abnormal movements (highest score from questions above): None, normal Incapacitation due to abnormal movements: None,  normal Patient's awareness of abnormal movements (rate only patient's report): No Awareness, Dental Status Current problems with teeth and/or dentures?: No Does patient usually wear dentures?: No  CIWA:    COWS:     Musculoskeletal: Strength & Muscle Tone: within normal limits Gait & Station: normal Patient leans: N/A  Psychiatric Specialty Exam:  Presentation  General Appearance:  Appropriate for Environment; Casual; Neat; Well Groomed  Eye Contact: Fair  Speech: Slow  Speech Volume: Decreased  Handedness: Left   Mood and Affect  Mood: Anxious  Affect: Congruent   Thought Process  Thought Processes: Goal Directed  Descriptions of Associations:Circumstantial  Orientation:Partial  Thought Content:Scattered  History of Schizophrenia/Schizoaffective disorder:Yes  Duration of Psychotic Symptoms:Greater than six months  Hallucinations:No data recorded Ideas of Reference:None  Suicidal Thoughts:No data recorded Homicidal Thoughts:No data recorded  Sensorium  Memory: Remote Good  Judgment: Fair  Insight: Fair   Materials engineer: Fair  Attention Span: Fair  Recall: [employer] of Knowledge: Fair  Language: Fair   Psychomotor Activity  Psychomotor Activity:No data recorded  Assets  Assets:No data recorded  Sleep  Sleep:No data recorded   Physical Exam: Physical Exam Constitutional:      Appearance: Normal appearance.  HENT:     Head: Normocephalic and atraumatic.     Mouth/Throat:     Pharynx: Oropharynx is clear.  Eyes:     Pupils: Pupils are equal, round, and reactive to light.  Cardiovascular:     Rate and Rhythm: Normal rate and regular rhythm.  Pulmonary:     Effort: Pulmonary effort is normal.      Breath sounds: Normal breath sounds.  Abdominal:     General: Abdomen is flat.     Palpations: Abdomen is soft.  Musculoskeletal:        General: Normal range of motion.  Skin:    General: Skin is warm and dry.  Neurological:     General: No focal deficit present.     Mental Status: He is alert. Mental status is at baseline.  Psychiatric:        Attention and Perception: He is inattentive.        Mood and Affect: Mood normal. Affect is blunt.        Speech: He is noncommunicative.    Review of Systems  Unable to perform ROS: Language   Blood pressure 115/83, pulse (!) 108, temperature 98.8 F (37.1 C), temperature source Oral, resp. rate 18, height 5' 8"$  (1.727 m), weight 85.8 kg, SpO2 100 %. Body mass index is 28.76 kg/m.   Treatment Plan Summary: Plan no change to medicine management.  No change to overall treatment.  Patient is completely stable and just waiting for discharge  Alethia Berthold, MD 01/18/2023, 11:12 AM

## 2023-01-18 NOTE — BHH Group Notes (Signed)
[place] Group Notes:  (Nursing/MHT/Case Management/Adjunct)  Date:  01/18/2023  Time:  9:34 AM  Type of Therapy:   community meeting  Participation Level:  None  Participation Quality:   none  Affect:  Appropriate  Cognitive:  Lacking  Insight:  None  Engagement in Group:  None  Modes of Intervention:  Discussion and Education  Summary of Progress/Problems:  Antonieta Pert 01/18/2023, 9:34 AM

## 2023-01-18 NOTE — Plan of Care (Signed)
D- Patient alert and oriented to person, place, and situation. Patient presented in a preoccupied, but pleasant mood on assessment stating that he slept good last night. Patient had complaints of right lower leg pain, rating it a "6/10", in which he did request PRN medication to help with relief. Patient denied SI, HI, AVH, at this time. Patient also denied any signs/symptoms of depression and anxiety by shaking his head and saying "no". Patient had no stated goals for today.  A- Scheduled medications administered to patient, per MD orders. Support and encouragement provided.  Routine safety checks conducted every 15 minutes.  Patient informed to notify staff with problems or concerns.  R- No adverse drug reactions noted. Patient contracts for safety at this time. Patient compliant with medications and treatment plan. Patient receptive, calm, and cooperative. Patient interacts well with others on the unit. Patient remains safe at this time.  Problem: Education: Goal: Ability to state activities that reduce stress will improve Outcome: Progressing   Problem: Coping: Goal: Ability to identify and develop effective coping behavior will improve Outcome: Progressing   Problem: Self-Concept: Goal: Ability to identify factors that promote anxiety will improve Outcome: Progressing Goal: Level of anxiety will decrease Outcome: Progressing Goal: Ability to modify response to factors that promote anxiety will improve Outcome: Progressing   Problem: Education: Goal: Knowledge of General Education information will improve Description: Including pain rating scale, medication(s)/side effects and non-pharmacologic comfort measures Outcome: Progressing   Problem: Health Behavior/Discharge Planning: Goal: Ability to manage health-related needs will improve Outcome: Progressing   Problem: Clinical Measurements: Goal: Ability to maintain clinical measurements within normal limits will improve Outcome:  Progressing Goal: Will remain free from infection Outcome: Progressing Goal: Diagnostic test results will improve Outcome: Progressing Goal: Respiratory complications will improve Outcome: Progressing Goal: Cardiovascular complication will be avoided Outcome: Progressing   Problem: Activity: Goal: Risk for activity intolerance will decrease Outcome: Progressing   Problem: Nutrition: Goal: Adequate nutrition will be maintained Outcome: Progressing   Problem: Coping: Goal: Level of anxiety will decrease Outcome: Progressing   Problem: Elimination: Goal: Will not experience complications related to bowel motility Outcome: Progressing Goal: Will not experience complications related to urinary retention Outcome: Progressing   Problem: Pain Managment: Goal: General experience of comfort will improve Outcome: Progressing   Problem: Safety: Goal: Ability to remain free from injury will improve Outcome: Progressing   Problem: Skin Integrity: Goal: Risk for impaired skin integrity will decrease Outcome: Progressing   Problem: Activity: Goal: Interest or engagement in leisure activities will improve Outcome: Progressing   Problem: Coping: Goal: Coping ability will improve Outcome: Progressing   Problem: Education: Goal: Will be free of psychotic symptoms Outcome: Progressing

## 2023-01-19 NOTE — Progress Notes (Signed)
   01/19/23 1420  Psych Admission Type (Psych Patients Only)  Admission Status Involuntary  Psychosocial Assessment  Patient Complaints None  Eye Contact Fair  Facial Expression Animated;Fixed smile  Affect Appropriate to circumstance  Speech Aphasic  Interaction Childlike;Needy  Motor Activity Shuffling;Slow  Appearance/Hygiene Layered clothes;Improved  Behavior Characteristics Cooperative;Appropriate to situation  Mood Pleasant

## 2023-01-19 NOTE — Plan of Care (Signed)
D- Patient alert and oriented to person, place, and situation. Patient initially presented in an irritable mood on assessment, but has since brightened up and been very pleasant. Patient did endorse right leg pain, rating it a "6/10", in which he did request PRN pain medication from this Probation officer. Patient denied any signs/symptoms of depression and anxiety by shaking his head no. Patient also denied SI, HI, AVH at this time. Patient had no stated goals for today.  A- Scheduled medications administered to patient, per MD orders. Support and encouragement provided.  Routine safety checks conducted every 15 minutes.  Patient informed to notify staff with problems or concerns.  R- No adverse drug reactions noted. Patient contracts for safety at this time. Patient compliant with medications and treatment plan. Patient receptive, calm, and cooperative. Patient remains safe at this time.  Problem: Education: Goal: Ability to state activities that reduce stress will improve Outcome: Not Progressing   Problem: Coping: Goal: Ability to identify and develop effective coping behavior will improve Outcome: Not Progressing   Problem: Self-Concept: Goal: Ability to identify factors that promote anxiety will improve Outcome: Not Progressing Goal: Level of anxiety will decrease Outcome: Not Progressing Goal: Ability to modify response to factors that promote anxiety will improve Outcome: Not Progressing   Problem: Education: Goal: Knowledge of General Education information will improve Description: Including pain rating scale, medication(s)/side effects and non-pharmacologic comfort measures Outcome: Not Progressing   Problem: Health Behavior/Discharge Planning: Goal: Ability to manage health-related needs will improve Outcome: Not Progressing   Problem: Clinical Measurements: Goal: Ability to maintain clinical measurements within normal limits will improve Outcome: Not Progressing Goal: Will remain  free from infection Outcome: Not Progressing Goal: Diagnostic test results will improve Outcome: Not Progressing Goal: Respiratory complications will improve Outcome: Not Progressing Goal: Cardiovascular complication will be avoided Outcome: Not Progressing   Problem: Activity: Goal: Risk for activity intolerance will decrease Outcome: Not Progressing   Problem: Nutrition: Goal: Adequate nutrition will be maintained Outcome: Not Progressing   Problem: Coping: Goal: Level of anxiety will decrease Outcome: Not Progressing   Problem: Elimination: Goal: Will not experience complications related to bowel motility Outcome: Not Progressing Goal: Will not experience complications related to urinary retention Outcome: Not Progressing   Problem: Pain Managment: Goal: General experience of comfort will improve Outcome: Not Progressing   Problem: Safety: Goal: Ability to remain free from injury will improve Outcome: Not Progressing   Problem: Skin Integrity: Goal: Risk for impaired skin integrity will decrease Outcome: Not Progressing   Problem: Activity: Goal: Interest or engagement in leisure activities will improve Outcome: Not Progressing   Problem: Coping: Goal: Coping ability will improve Outcome: Not Progressing   Problem: Education: Goal: Will be free of psychotic symptoms Outcome: Not Progressing

## 2023-01-19 NOTE — [facility] Group Notes (Signed)
[place] Group Notes:  (Nursing/MHT/Case Management/Adjunct)  Date:  01/19/2023  Time:  10:03 AM  Type of Therapy:   community meeting  Participation Level:  Did Not Attend    Adam [name] 01/19/2023, 10:03 AM

## 2023-01-19 NOTE — Progress Notes (Signed)
Patient just woke up and is in an irritable mood this morning. Patient refused to let this writer obtain a set of vitals on him, but did take his scheduled medication. MD will be notified during progression rounds.

## 2023-01-19 NOTE — Progress Notes (Signed)
Patient came to the nurses station pointing at a piece of paper with Adam [name]' name on it. This Probation officer asked if he wanted some pictures and he said "yeah". Once this Probation officer gave patient a few pictures, his face brightened up, he said "oh yeah", smiled at this Probation officer and went back to his room.

## 2023-01-19 NOTE — Progress Notes (Signed)
[facility] MD Progress Note  01/19/2023 12:44 PM Adam [name]  MRN:  [id number] Subjective: Follow-up patient with schizophrenia.  No new complaints.  Staff notes that he was a little agitated last night.  There was some speculation this could have been because the hockey team lost.  He looked a little down this morning and would not communicate with me but is taking care of himself fine with no agitation.  Seemed to tear up a little later.  No specific complaints. 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[place])   Stroke (HCC)   HTN (hypertension)   HLD (hyperlipidemia)   CAD (coronary artery disease)   Chronic systolic CHF (congestive heart failure) (HCC)   Anxiety   Infection of left earlobe  Total Time spent with patient: 30 minutes  Past Psychiatric History: Past history of schizophrenia  Past Medical History:  Past Medical History:  Diagnosis Date   Myocardial infarction ([place])    Schizophrenia ([place])    Stroke ([place])     Past Surgical History:  Procedure Laterality Date   NO PAST SURGERIES     Family History:  Family History  Problem Relation Age of Onset   Hypertension Mother    Family Psychiatric  History: See previous Social History:  Social History   Substance and Sexual Activity  Alcohol Use Not Currently     Social History   Substance and Sexual Activity  Drug Use Not Currently    Social History   Socioeconomic History   Marital status: Single    Spouse name: Not on file   Number of children: Not on file   Years of education: Not on file   Highest education level: Not on file  Occupational History   Not on file  Tobacco Use   Smoking status: Never    Passive exposure: Never   Smokeless tobacco: Never  Vaping Use   Vaping Use: Unknown   Substance and Sexual Activity   Alcohol use: Not Currently   Drug use: Not Currently   Sexual activity: Not Currently  Other Topics Concern   Not on file  Social History Narrative   Not on file   Social Determinants of Health   Financial Resource Strain: Not on file  Food Insecurity: Not on file  Transportation Needs: Not on file  Physical Activity: Not on file  Stress: Not on file  Social Connections: Not on file   Additional Social History:  Specify valuables returned:  (none)                      Sleep: Fair  Appetite:   SeeFair  Current Medications: Current Facility-Administered Medications  Medication Dose Route Frequency Provider Last Rate Last Admin   acetaminophen (TYLENOL) tablet 650 mg  650 mg Oral Q6H PRN Danel Studzinski T, MD   650 mg at 01/19/23 0922   alum & mag hydroxide-simeth (MAALOX/MYLANTA) 200-200-20 MG/5ML suspension 30 mL  30 mL Oral Q4H PRN Lachrisha Ziebarth T, MD   30 mL at 04/26/22 [id number]   aspirin EC tablet 81 mg  81 mg Oral Daily Verl Kitson, [name], MD   81 mg at 01/19/23 [id number]   atorvastatin (LIPITOR) tablet 20 mg  20 mg Oral Daily Phala Schraeder, [name], MD   20 mg at 01/19/23 0923   atropine 1 % ophthalmic solution 1  drop  1 drop Sublingual QID [name], MD   1 drop at 01/19/23 1158   benztropine (COGENTIN) tablet 0.5 mg  0.5 mg Oral BID Chaslyn Eisen T, MD   0.5 mg at 01/19/23 [id number]   clonazePAM (KLONOPIN) tablet 1 mg  1 mg Oral TID PRN He, [name], MD   1 mg at 01/18/23 2107   cloZAPine (CLOZARIL) tablet 300 mg  300 mg Oral QHS Aithana Kushner T, MD   300 mg at 01/18/23 2106   diphenhydrAMINE (BENADRYL) capsule 50 mg  50 mg Oral Q6H PRN Razia Screws T, MD   50 mg at 01/14/23 2045   Or   diphenhydrAMINE (BENADRYL) injection 50 mg  50 mg Intramuscular Q6H PRN Chayne Baumgart T, MD   50 mg at 01/01/23 2252   feeding supplement (ENSURE ENLIVE / ENSURE PLUS) liquid 237 mL  237 mL Oral BID BM Reymundo Winship T, MD   237 mL at 01/19/23 0924   gabapentin (NEURONTIN)  capsule 300 mg  300 mg Oral TID Martin Belling T, MD   300 mg at 01/19/23 [id number]   haloperidol (HALDOL) tablet 2 mg  2 mg Oral TID Matty Vanroekel, [name], MD   2 mg at 01/19/23 [id number]   haloperidol (HALDOL) tablet 5 mg  5 mg Oral Q6H PRN Minta Fair, [name], MD   5 mg at 01/13/23 1939   Or   haloperidol lactate (HALDOL) injection 5 mg  5 mg Intramuscular Q6H PRN Shondrea Steinert T, MD   5 mg at 01/01/23 2252   lisinopril (ZESTRIL) tablet 5 mg  5 mg Oral Daily Akshara Blumenthal T, MD   5 mg at 01/19/23 [id number]   magnesium hydroxide (MILK OF MAGNESIA) suspension 30 mL  30 mL Oral Daily PRN Jaxin Fulfer T, MD   30 mL at 12/03/22 1526   metoprolol succinate (TOPROL-XL) 24 hr tablet 25 mg  25 mg Oral Daily Kirstyn Lean T, MD   25 mg at 01/19/23 0922   ondansetron (ZOFRAN) tablet 4 mg  4 mg Oral Q8H PRN [name], NP   4 mg at 09/06/22 2320   paliperidone (INVEGA SUSTENNA) injection 156 mg  156 mg Intramuscular Q28 days Fredie Majano T, MD   156 mg at 01/15/23 1652   paliperidone (INVEGA) 24 hr tablet 6 mg  6 mg Oral QHS Aveer Bartow T, MD   6 mg at 01/18/23 2106   senna-docusate (Senokot-S) tablet 2 tablet  2 tablet Oral QHS PRN [name], DO   2 tablet at 09/15/22 2110   temazepam (RESTORIL) capsule 15 mg  15 mg Oral QHS Rakan Soffer T, MD   15 mg at 01/18/23 2106   ziprasidone (GEODON) injection 20 mg  20 mg Intramuscular Q12H PRN Denee Boeder, [name], MD   20 mg at 07/08/22 1453    Lab Results: No results found for this or any previous visit (from the past 33 hour(s)).  Blood Alcohol level:  Lab Results  Component Value Date   ETH <10 [id number]    Metabolic Disorder Labs: Lab Results  Component Value Date   HGBA1C 5.5 04/10/2022   MPG 111.15 04/10/2022   No results found for: "PROLACTIN" Lab Results  Component Value Date   CHOL 167 11/20/2021   TRIG 107 11/20/2021   HDL 26 (L) 11/20/2021   CHOLHDL 6.4 11/20/2021   VLDL 21 11/20/2021   LDLCALC 120 (H) 11/20/2021    Physical  Findings: AIMS: Facial and Oral Movements Muscles of  Facial Expression: None, normal Lips and Perioral Area: None, normal Jaw: None, normal Tongue: None, normal,Extremity Movements Upper (arms, wrists, hands, fingers): None, normal Lower (legs, knees, ankles, toes): None, normal, Trunk Movements Neck, shoulders, hips: None, normal, Overall Severity Severity of abnormal movements (highest score from questions above): None, normal Incapacitation due to abnormal movements: None, normal Patient's awareness of abnormal movements (rate only patient's report): No Awareness, Dental Status Current problems with teeth and/or dentures?: No Does patient usually wear dentures?: No  CIWA:    COWS:     Musculoskeletal: Strength & Muscle Tone: within normal limits Gait & Station: normal Patient leans: N/A  Psychiatric Specialty Exam:  Presentation  General Appearance:  Appropriate for Environment; Casual; Neat; Well Groomed  Eye Contact: Fair  Speech: Slow  Speech Volume: Decreased  Handedness: Left   Mood and Affect  Mood: Anxious  Affect: Congruent   Thought Process  Thought Processes: Goal Directed  Descriptions of Associations:Circumstantial  Orientation:Partial  Thought Content:Scattered  History of Schizophrenia/Schizoaffective disorder:Yes  Duration of Psychotic Symptoms:Greater than six months  Hallucinations:No data recorded Ideas of Reference:None  Suicidal Thoughts:No data recorded Homicidal Thoughts:No data recorded  Sensorium  Memory: Remote Good  Judgment: Fair  Insight: Fair   Community education officer  Concentration: Fair  Attention Span: Fair  Recall: [employer] of Knowledge: Fair  Language: Fair   Psychomotor Activity  Psychomotor Activity:No data recorded  Assets  Assets:No data recorded  Sleep  Sleep:No data recorded   Physical Exam: Physical Exam Vitals and nursing note reviewed.  Constitutional:       Appearance: Normal appearance.  HENT:     Head: Normocephalic and atraumatic.     Mouth/Throat:     Pharynx: Oropharynx is clear.  Eyes:     Pupils: Pupils are equal, round, and reactive to light.  Cardiovascular:     Rate and Rhythm: Normal rate and regular rhythm.  Pulmonary:     Effort: Pulmonary effort is normal.     Breath sounds: Normal breath sounds.  Abdominal:     General: Abdomen is flat.     Palpations: Abdomen is soft.  Musculoskeletal:        General: Normal range of motion.  Skin:    General: Skin is warm and dry.  Neurological:     General: No focal deficit present.     Mental Status: He is alert. Mental status is at baseline.  Psychiatric:        Attention and Perception: He is inattentive.        Mood and Affect: Mood normal. Affect is blunt.        Speech: He is noncommunicative.    Review of Systems  Unable to perform ROS: Language  Constitutional: Negative.   HENT: Negative.    Eyes: Negative.   Respiratory: Negative.    Cardiovascular: Negative.   Gastrointestinal: Negative.   Musculoskeletal: Negative.   Skin: Negative.   Neurological: Negative.    Blood pressure 99/86, pulse 100, temperature 98.6 F (37 C), temperature source Oral, resp. rate 18, height 5' 8"$  (1.727 m), weight 85.8 kg, SpO2 94 %. Body mass index is 28.76 kg/m.   Treatment Plan Summary: Medication management and Plan no change to medication or overall plan.  Continue to focus on discharge  Alethia Berthold, MD 01/19/2023, 12:44 PM

## 2023-01-19 NOTE — Progress Notes (Signed)
Patient is alert and oriented x 4, affect is flat but brighten upon approach and interacting appropriately with peers and staff. Patient denies SI/HI/AVH, he was offered emotional support. Patient was complaint with medication regimen no distress noted will continue to monitor.

## 2023-01-19 NOTE — Progress Notes (Signed)
Patient just came to the nurses station saying "locked up", over and over again. When this writer asked patient if he was tired of being in the hospital he yelled "yeah", with a scowl on his face, and walked off.

## 2023-01-19 NOTE — Group Note (Signed)
LCSW Group Therapy Note   Group Date: 01/19/2023 Start Time: 1200 End Time: 1245   Type of Therapy/Topic: Identifying Irrational Beliefs/Thoughts  Participation Level: Did Not Attend  Description of Group: The purpose of this group is to assist patients in learning to identify irrational beliefs and thoughts that contribute to their negative emotions and experience positive emotions. Patients will be guided to discuss ways in which they have been effected by irrational thoughts and beliefs and how to transform those irrational beliefs into rational ones. Newly identified rational beliefs will be juxtaposed with experiences of positive emotions or situations, and patients will be challenged to use rational beliefs or thoughts to combat negative ones. Special emphasis will be placed on coping with irrational beliefs in conflict situations, and patients will process healthy conflict resolution skills.  Therapeutic Goals: 1. Patient will identify two irrational thoughts or beliefs  to reflect on in order to balance out those thoughts 2. Patient will label two or more irrational thoughts/beliefs that they find the most difficult to cope with 3. Patient will demonstrate positive conflict resolution skills through discussion and/or role plays that will assist in transforming irrational thoughts or beliefs into positive ones.  Summary of Patient Progress: Did not attend   Therapeutic Modalities: Cognitive Behavioral Therapy Feelings Identification Dialectical Behavioral Therapy   Darletta Moll MSW, LCSW Clincal Social Worker

## 2023-01-19 NOTE — Group Note (Deleted)
LCSW Group Therapy Note   Group Date: 01/19/2023 Start Time: 1200 End Time: 1245   Type of Therapy and Topic:  Group Therapy:   Participation Level:  {[facility] PARTICIPATION [id number]  Description of Group:   Therapeutic Goals:  1.     Summary of Patient Progress:    ***  Therapeutic Modalities:   Doristine Johns 01/19/2023  12:50 PM

## 2023-01-20 NOTE — Progress Notes (Signed)
Patient is alert and oriented x 4,he appears brighter interacting appropriately with peers and staffs. Patient denies SI/HI/AVH, he was offered emotional support and encouragement. Patient was complaint with medication regimen no distress noted will continue to monitor.

## 2023-01-20 NOTE — Group Note (Signed)
Date:  01/20/2023 Time:  10:51 PM  Group Topic/Focus:  Healthy Communication:   The focus of this group is to discuss communication, barriers to communication, as well as healthy ways to communicate with others.    Participation Level:  Minimal  Participation Quality:  Appropriate  Affect:  Appropriate  Cognitive:  Appropriate  Insight: Appropriate  Engagement in Group:  Limited  Modes of Intervention:  Activity and Discussion  Additional Comments:  " What's Your Ideal Support System? "  Adam [name] h Adam [name] 01/20/2023, 10:51 PM

## 2023-01-20 NOTE — [facility] Group Notes (Signed)
[place] Group Notes:  (Nursing/MHT/Case Management/Adjunct)  Date:  01/20/2023  Time:  11:21 AM  Type of Therapy:   community meeting  Participation Level:  Did Not Attend    Adam [name] 01/20/2023, 11:21 AM

## 2023-01-20 NOTE — Plan of Care (Signed)
  Problem: Education: Goal: Ability to state activities that reduce stress will improve Outcome: Not Progressing   Problem: Coping: Goal: Ability to identify and develop effective coping behavior will improve Outcome: Not Progressing   Problem: Self-Concept: Goal: Ability to identify factors that promote anxiety will improve Outcome: Not Progressing Goal: Level of anxiety will decrease Outcome: Not Progressing Goal: Ability to modify response to factors that promote anxiety will improve Outcome: Not Progressing   Problem: Education: Goal: Knowledge of General Education information will improve Description: Including pain rating scale, medication(s)/side effects and non-pharmacologic comfort measures Outcome: Not Progressing   Problem: Health Behavior/Discharge Planning: Goal: Ability to manage health-related needs will improve Outcome: Not Progressing   Problem: Clinical Measurements: Goal: Ability to maintain clinical measurements within normal limits will improve Outcome: Not Progressing Goal: Will remain free from infection Outcome: Not Progressing Goal: Diagnostic test results will improve Outcome: Not Progressing Goal: Respiratory complications will improve Outcome: Not Progressing Goal: Cardiovascular complication will be avoided Outcome: Not Progressing   Problem: Activity: Goal: Risk for activity intolerance will decrease Outcome: Not Progressing   Problem: Nutrition: Goal: Adequate nutrition will be maintained Outcome: Not Progressing   Problem: Coping: Goal: Level of anxiety will decrease Outcome: Not Progressing   Problem: Elimination: Goal: Will not experience complications related to bowel motility Outcome: Not Progressing Goal: Will not experience complications related to urinary retention Outcome: Not Progressing   Problem: Pain Managment: Goal: General experience of comfort will improve Outcome: Not Progressing   Problem: Safety: Goal:  Ability to remain free from injury will improve Outcome: Not Progressing   Problem: Skin Integrity: Goal: Risk for impaired skin integrity will decrease Outcome: Not Progressing   Problem: Activity: Goal: Interest or engagement in leisure activities will improve Outcome: Not Progressing   Problem: Coping: Goal: Coping ability will improve Outcome: Not Progressing   Problem: Education: Goal: Will be free of psychotic symptoms Outcome: Not Progressing   

## 2023-01-20 NOTE — Progress Notes (Signed)
D- Patient alert and oriented x2-3. Affect blunted/mood labile. Denies SI/ HI/ AVH. He denies pain. No outbursts or demanding behavior noted this shift. A- Scheduled medications administered to patient, per MD orders. Support and encouragement provided.  Routine safety checks conducted every 15 minutes.  Patient informed to notify staff with problems or concerns. R- No adverse drug reactions noted. Patient compliant with medications and treatment plan. Patient receptive and cooperative. Patient interacts well with others on the unit. He contracts for safety and remains safe on the unit at this time.

## 2023-01-20 NOTE — Progress Notes (Signed)
[facility] MD Progress Note  01/20/2023 1:19 PM Adam [name]  MRN:  [id number] Subjective: Patient with schizophrenia.  Very stable.  In the last day he was back to saying "[facility]".  I explained to him again that we have tried to contact that facility and they have declined him.  He had the appropriate reaction of looking frustrated and then changing it to "[facility] no way". 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[place])   Stroke (HCC)   HTN (hypertension)   HLD (hyperlipidemia)   CAD (coronary artery disease)   Chronic systolic CHF (congestive heart failure) (HCC)   Anxiety   Infection of left earlobe  Total Time spent with patient: 30 minutes  Past Psychiatric History: Past history of schizophrenia and stroke  Past Medical History:  Past Medical History:  Diagnosis Date   Myocardial infarction ([place])    Schizophrenia ([place])    Stroke ([place])     Past Surgical History:  Procedure Laterality Date   NO PAST SURGERIES     Family History:  Family History  Problem Relation Age of Onset   Hypertension Mother    Family Psychiatric  History: See previous Social History:  Social History   Substance and Sexual Activity  Alcohol Use Not Currently     Social History   Substance and Sexual Activity  Drug Use Not Currently    Social History   Socioeconomic History   Marital status: Single    Spouse name: Not on file   Number of children: Not on file   Years of education: Not on file   Highest education level: Not on file  Occupational History   Not on file  Tobacco Use   Smoking status: Never    Passive exposure: Never   Smokeless tobacco: Never  Vaping Use   Vaping Use: Unknown  Substance and Sexual Activity   Alcohol use: Not Currently   Drug  use: Not Currently   Sexual activity: Not Currently  Other Topics Concern   Not on file  Social History Narrative   Not on file   Social Determinants of Health   Financial Resource Strain: Not on file  Food Insecurity: Not on file  Transportation Needs: Not on file  Physical Activity: Not on file  Stress: Not on file  Social Connections: Not on file   Additional Social History:  Specify valuables returned:  (none)                      Sleep: Fair  Appetite:  Fair  Current Medications: Current Facility-Administered Medications  Medication Dose Route Frequency Provider Last Rate Last Admin   acetaminophen (TYLENOL) tablet 650 mg  650 mg Oral Q6H PRN Cionna Collantes T, MD   650 mg at 01/19/23 0922   alum & mag hydroxide-simeth (MAALOX/MYLANTA) 200-200-20 MG/5ML suspension 30 mL  30 mL Oral Q4H PRN Macen Joslin T, MD   30 mL at 04/26/22 [id number]   aspirin EC tablet 81 mg  81 mg Oral Daily Mahoganie Basher, [name], MD   81 mg at 01/20/23 0855   atorvastatin (LIPITOR) tablet 20 mg  20 mg Oral Daily Mahmud Keithly T, MD   20 mg at 01/20/23 0856   atropine 1 % ophthalmic solution 1 drop  1 drop Sublingual QID [name], MD   1 drop  at 01/20/23 1237   benztropine (COGENTIN) tablet 0.5 mg  0.5 mg Oral BID Lativia Velie T, MD   0.5 mg at 01/20/23 0856   clonazePAM (KLONOPIN) tablet 1 mg  1 mg Oral TID PRN He, [name], MD   1 mg at 01/19/23 2121   cloZAPine (CLOZARIL) tablet 300 mg  300 mg Oral QHS Cieara Stierwalt T, MD   300 mg at 01/19/23 2122   diphenhydrAMINE (BENADRYL) capsule 50 mg  50 mg Oral Q6H PRN Yaiza Palazzola T, MD   50 mg at 01/14/23 2045   Or   diphenhydrAMINE (BENADRYL) injection 50 mg  50 mg Intramuscular Q6H PRN Prathik Aman T, MD   50 mg at 01/01/23 2252   feeding supplement (ENSURE ENLIVE / ENSURE PLUS) liquid 237 mL  237 mL Oral BID BM Adante Courington T, MD   237 mL at 01/20/23 1235   gabapentin (NEURONTIN) capsule 300 mg  300 mg Oral TID Madaline Lefeber T, MD   300 mg at 01/20/23  0900   haloperidol (HALDOL) tablet 2 mg  2 mg Oral TID Sherena Machorro, [name], MD   2 mg at 01/20/23 0858   haloperidol (HALDOL) tablet 5 mg  5 mg Oral Q6H PRN Rino Hosea, [name], MD   5 mg at 01/13/23 1939   Or   haloperidol lactate (HALDOL) injection 5 mg  5 mg Intramuscular Q6H PRN Aldine Chakraborty T, MD   5 mg at 01/01/23 2252   lisinopril (ZESTRIL) tablet 5 mg  5 mg Oral Daily Lean Fayson T, MD   5 mg at 01/20/23 0855   magnesium hydroxide (MILK OF MAGNESIA) suspension 30 mL  30 mL Oral Daily PRN Melyna Huron T, MD   30 mL at 12/03/22 1526   metoprolol succinate (TOPROL-XL) 24 hr tablet 25 mg  25 mg Oral Daily Kasara Schomer T, MD   25 mg at 01/20/23 0855   ondansetron (ZOFRAN) tablet 4 mg  4 mg Oral Q8H PRN [name], NP   4 mg at 09/06/22 2320   paliperidone (INVEGA SUSTENNA) injection 156 mg  156 mg Intramuscular Q28 days Kensey Luepke T, MD   156 mg at 01/15/23 1652   paliperidone (INVEGA) 24 hr tablet 6 mg  6 mg Oral QHS Annalee Meyerhoff T, MD   6 mg at 01/19/23 2121   senna-docusate (Senokot-S) tablet 2 tablet  2 tablet Oral QHS PRN [name], DO   2 tablet at 09/15/22 2110   temazepam (RESTORIL) capsule 15 mg  15 mg Oral QHS Kyler Lerette T, MD   15 mg at 01/19/23 2122   ziprasidone (GEODON) injection 20 mg  20 mg Intramuscular Q12H PRN Fountain Derusha, [name], MD   20 mg at 07/08/22 1453    Lab Results: No results found for this or any previous visit (from the past 48 hour(s)).  Blood Alcohol level:  Lab Results  Component Value Date   ETH <10 [id number]    Metabolic Disorder Labs: Lab Results  Component Value Date   HGBA1C 5.5 04/10/2022   MPG 111.15 04/10/2022   No results found for: "PROLACTIN" Lab Results  Component Value Date   CHOL 167 11/20/2021   TRIG 107 11/20/2021   HDL 26 (L) 11/20/2021   CHOLHDL 6.4 11/20/2021   VLDL 21 11/20/2021   LDLCALC 120 (H) 11/20/2021    Physical Findings: AIMS: Facial and Oral Movements Muscles of Facial Expression: None,  normal Lips and Perioral Area: None, normal Jaw: None, normal  Tongue: None, normal,Extremity Movements Upper (arms, wrists, hands, fingers): None, normal Lower (legs, knees, ankles, toes): None, normal, Trunk Movements Neck, shoulders, hips: None, normal, Overall Severity Severity of abnormal movements (highest score from questions above): None, normal Incapacitation due to abnormal movements: None, normal Patient's awareness of abnormal movements (rate only patient's report): No Awareness, Dental Status Current problems with teeth and/or dentures?: No Does patient usually wear dentures?: No  CIWA:    COWS:     Musculoskeletal: Strength & Muscle Tone: within normal limits Gait & Station: normal Patient leans: N/A  Psychiatric Specialty Exam:  Presentation  General Appearance:  Appropriate for Environment; Casual; Neat; Well Groomed  Eye Contact: Fair  Speech: Slow  Speech Volume: Decreased  Handedness: Left   Mood and Affect  Mood: Anxious  Affect: Congruent   Thought Process  Thought Processes: Goal Directed  Descriptions of Associations:Circumstantial  Orientation:Partial  Thought Content:Scattered  History of Schizophrenia/Schizoaffective disorder:Yes  Duration of Psychotic Symptoms:Greater than six months  Hallucinations:No data recorded Ideas of Reference:None  Suicidal Thoughts:No data recorded Homicidal Thoughts:No data recorded  Sensorium  Memory: Remote Good  Judgment: Fair  Insight: Fair   Community education officer  Concentration: Fair  Attention Span: Fair  Recall: [employer] of Knowledge: Fair  Language: Fair   Psychomotor Activity  Psychomotor Activity:No data recorded  Assets  Assets:No data recorded  Sleep  Sleep:No data recorded   Physical Exam: Physical Exam Vitals and nursing note reviewed.  Constitutional:      Appearance: Normal appearance.  HENT:     Head: Normocephalic and atraumatic.      Mouth/Throat:     Pharynx: Oropharynx is clear.  Eyes:     Pupils: Pupils are equal, round, and reactive to light.  Cardiovascular:     Rate and Rhythm: Normal rate and regular rhythm.  Pulmonary:     Effort: Pulmonary effort is normal.     Breath sounds: Normal breath sounds.  Abdominal:     General: Abdomen is flat.     Palpations: Abdomen is soft.  Musculoskeletal:        General: Normal range of motion.  Skin:    General: Skin is warm and dry.  Neurological:     General: No focal deficit present.     Mental Status: He is alert. Mental status is at baseline.  Psychiatric:        Attention and Perception: He is inattentive.        Mood and Affect: Mood normal. Affect is blunt.        Speech: He is noncommunicative.    Review of Systems  Unable to perform ROS: Language   Blood pressure 111/81, pulse 96, temperature 97.6 F (36.4 C), temperature source Oral, resp. rate 18, height 5' 8"$  (1.727 m), weight 85.8 kg, SpO2 100 %. Body mass index is 28.76 kg/m.   Treatment Plan Summary: Medication management and Plan no change to medication plan.  Daily supportive therapy.  He is really very stable and waiting for discharge  Alethia Berthold, MD 01/20/2023, 1:19 PM

## 2023-01-21 NOTE — Plan of Care (Signed)
No irritable behaviors noted today. Compliant with medications. Appetite and energy level good. ADLs maintained. Support and encouragement given.

## 2023-01-21 NOTE — [facility] Group Notes (Signed)
[place] Group Notes:  (Nursing/MHT/Case Management/Adjunct)  Date:  01/21/2023  Time:  9:45 AM  Type of Therapy:   Community Meeting  Participation Level:  Active  Participation Quality:  Appropriate and Attentive  Affect:  Appropriate  Cognitive:  Alert and Appropriate  Insight:  Appropriate  Engagement in Group:  Engaged  Modes of Intervention:  Discussion and Education  Summary of Progress/Problems:  Hosie Spangle 01/21/2023, 9:45 AM

## 2023-01-21 NOTE — Progress Notes (Signed)
Patient is alert and oriented x 4, affect is flat but brighten upon approach and interacting appropriately with peers and staff. Patient denies SI/HI/AVH, he was offered emotional support. Patient was complaint with medication regimen no distress noted will continue to monitor.

## 2023-01-21 NOTE — Progress Notes (Signed)
[facility] MD Progress Note  01/21/2023 2:43 PM Adam [name]  MRN:  [id number] Subjective: Follow-up patient with schizophrenia.  Patient seemed to be in an upbeat mood today.  He was smiling and looked happy.  No change to fundamental behavior. 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[place])   Stroke (HCC)   HTN (hypertension)   HLD (hyperlipidemia)   CAD (coronary artery disease)   Chronic systolic CHF (congestive heart failure) (HCC)   Anxiety   Infection of left earlobe  Total Time spent with patient: 30 minutes  Past Psychiatric History: Past history of schizophrenia  Past Medical History:  Past Medical History:  Diagnosis Date   Myocardial infarction ([place])    Schizophrenia ([place])    Stroke ([place])     Past Surgical History:  Procedure Laterality Date   NO PAST SURGERIES     Family History:  Family History  Problem Relation Age of Onset   Hypertension Mother    Family Psychiatric  History: See previous Social History:  Social History   Substance and Sexual Activity  Alcohol Use Not Currently     Social History   Substance and Sexual Activity  Drug Use Not Currently    Social History   Socioeconomic History   Marital status: Single    Spouse name: Not on file   Number of children: Not on file   Years of education: Not on file   Highest education level: Not on file  Occupational History   Not on file  Tobacco Use   Smoking status: Never    Passive exposure: Never   Smokeless tobacco: Never  Vaping Use   Vaping Use: Unknown  Substance and Sexual Activity   Alcohol use: Not Currently   Drug use: Not Currently   Sexual activity: Not Currently  Other Topics Concern   Not on file  Social History Narrative   Not on file   Social Determinants  of Health   Financial Resource Strain: Not on file  Food Insecurity: Not on file  Transportation Needs: Not on file  Physical Activity: Not on file  Stress: Not on file  Social Connections: Not on file   Additional Social History:  Specify valuables returned:  (none)                      Sleep: Fair  Appetite:  Fair  Current Medications: Current Facility-Administered Medications  Medication Dose Route Frequency Provider Last Rate Last Admin   acetaminophen (TYLENOL) tablet 650 mg  650 mg Oral Q6H PRN Angelina Neece T, MD   650 mg at 01/19/23 0922   alum & mag hydroxide-simeth (MAALOX/MYLANTA) 200-200-20 MG/5ML suspension 30 mL  30 mL Oral Q4H PRN Kenneisha Cochrane T, MD   30 mL at 04/26/22 [id number]   aspirin EC tablet 81 mg  81 mg Oral Daily Lanny Lipkin, [name], MD   81 mg at 01/21/23 0856   atorvastatin (LIPITOR) tablet 20 mg  20 mg Oral Daily Julieanna Geraci T, MD   20 mg at 01/21/23 0856   atropine 1 % ophthalmic solution 1 drop  1 drop Sublingual QID [name], MD   1 drop at 01/21/23 1257   benztropine (COGENTIN) tablet 0.5 mg  0.5 mg Oral BID Usha Slager, [name], MD   0.5 mg at 01/21/23 0856   clonazePAM (KLONOPIN)  tablet 1 mg  1 mg Oral TID PRN He, [name], MD   1 mg at 01/19/23 2121   cloZAPine (CLOZARIL) tablet 300 mg  300 mg Oral QHS Icis Budreau T, MD   300 mg at 01/20/23 2122   diphenhydrAMINE (BENADRYL) capsule 50 mg  50 mg Oral Q6H PRN Bond Grieshop, [name], MD   50 mg at 01/14/23 2045   Or   diphenhydrAMINE (BENADRYL) injection 50 mg  50 mg Intramuscular Q6H PRN Kielyn Kardell T, MD   50 mg at 01/01/23 2252   feeding supplement (ENSURE ENLIVE / ENSURE PLUS) liquid 237 mL  237 mL Oral BID BM Caila Cirelli T, MD   237 mL at 01/21/23 1056   gabapentin (NEURONTIN) capsule 300 mg  300 mg Oral TID Karianne Nogueira T, MD   300 mg at 01/21/23 [id number]   haloperidol (HALDOL) tablet 2 mg  2 mg Oral TID Abisai Coble, [name], MD   2 mg at 01/21/23 [id number]   haloperidol (HALDOL) tablet 5 mg  5 mg Oral Q6H PRN  Phallon Haydu, [name], MD   5 mg at 01/13/23 1939   Or   haloperidol lactate (HALDOL) injection 5 mg  5 mg Intramuscular Q6H PRN Emaya Preston T, MD   5 mg at 01/01/23 2252   lisinopril (ZESTRIL) tablet 5 mg  5 mg Oral Daily Dior Stepter T, MD   5 mg at 01/21/23 0856   magnesium hydroxide (MILK OF MAGNESIA) suspension 30 mL  30 mL Oral Daily PRN Bodie Abernethy T, MD   30 mL at 01/20/23 2133   metoprolol succinate (TOPROL-XL) 24 hr tablet 25 mg  25 mg Oral Daily Kasean Denherder T, MD   25 mg at 01/21/23 0856   ondansetron (ZOFRAN) tablet 4 mg  4 mg Oral Q8H PRN [name], NP   4 mg at 09/06/22 2320   paliperidone (INVEGA SUSTENNA) injection 156 mg  156 mg Intramuscular Q28 days Roshaun Pound T, MD   156 mg at 01/15/23 1652   paliperidone (INVEGA) 24 hr tablet 6 mg  6 mg Oral QHS Hazen Brumett T, MD   6 mg at 01/20/23 2123   senna-docusate (Senokot-S) tablet 2 tablet  2 tablet Oral QHS PRN [name], DO   2 tablet at 01/20/23 2133   temazepam (RESTORIL) capsule 15 mg  15 mg Oral QHS Lester Crickenberger T, MD   15 mg at 01/20/23 2122   ziprasidone (GEODON) injection 20 mg  20 mg Intramuscular Q12H PRN Meleana Commerford, [name], MD   20 mg at 07/08/22 1453    Lab Results: No results found for this or any previous visit (from the past 48 hour(s)).  Blood Alcohol level:  Lab Results  Component Value Date   ETH <10 [id number]    Metabolic Disorder Labs: Lab Results  Component Value Date   HGBA1C 5.5 04/10/2022   MPG 111.15 04/10/2022   No results found for: "PROLACTIN" Lab Results  Component Value Date   CHOL 167 11/20/2021   TRIG 107 11/20/2021   HDL 26 (L) 11/20/2021   CHOLHDL 6.4 11/20/2021   VLDL 21 11/20/2021   LDLCALC 120 (H) 11/20/2021    Physical Findings: AIMS: Facial and Oral Movements Muscles of Facial Expression: None, normal Lips and Perioral Area: None, normal Jaw: None, normal Tongue: None, normal,Extremity Movements Upper (arms, wrists, hands, fingers): None,  normal Lower (legs, knees, ankles, toes): None, normal, Trunk Movements Neck, shoulders, hips: None, normal, Overall Severity Severity of abnormal  movements (highest score from questions above): None, normal Incapacitation due to abnormal movements: None, normal Patient's awareness of abnormal movements (rate only patient's report): No Awareness, Dental Status Current problems with teeth and/or dentures?: No Does patient usually wear dentures?: No  CIWA:    COWS:     Musculoskeletal: Strength & Muscle Tone: within normal limits Gait & Station: normal Patient leans: N/A  Psychiatric Specialty Exam:  Presentation  General Appearance:  Appropriate for Environment; Casual; Neat; Well Groomed  Eye Contact: Fair  Speech: Slow  Speech Volume: Decreased  Handedness: Left   Mood and Affect  Mood: Anxious  Affect: Congruent   Thought Process  Thought Processes: Goal Directed  Descriptions of Associations:Circumstantial  Orientation:Partial  Thought Content:Scattered  History of Schizophrenia/Schizoaffective disorder:Yes  Duration of Psychotic Symptoms:Greater than six months  Hallucinations:No data recorded Ideas of Reference:None  Suicidal Thoughts:No data recorded Homicidal Thoughts:No data recorded  Sensorium  Memory: Remote Good  Judgment: Fair  Insight: Fair   Community education officer  Concentration: Fair  Attention Span: Fair  Recall: [employer] of Knowledge: Fair  Language: Fair   Psychomotor Activity  Psychomotor Activity:No data recorded  Assets  Assets:No data recorded  Sleep  Sleep:No data recorded   Physical Exam: Physical Exam Vitals and nursing note reviewed.  Constitutional:      Appearance: Normal appearance.  HENT:     Head: Normocephalic and atraumatic.     Mouth/Throat:     Pharynx: Oropharynx is clear.  Eyes:     Pupils: Pupils are equal, round, and reactive to light.  Cardiovascular:     Rate and  Rhythm: Normal rate and regular rhythm.  Pulmonary:     Effort: Pulmonary effort is normal.     Breath sounds: Normal breath sounds.  Abdominal:     General: Abdomen is flat.     Palpations: Abdomen is soft.  Musculoskeletal:        General: Normal range of motion.  Skin:    General: Skin is warm and dry.  Neurological:     General: No focal deficit present.     Mental Status: He is alert. Mental status is at baseline.  Psychiatric:        Attention and Perception: He is inattentive.        Mood and Affect: Mood normal. Affect is blunt.        Speech: He is noncommunicative.    Review of Systems  Unable to perform ROS: Language   Blood pressure 111/81, pulse 96, temperature 97.6 F (36.4 C), temperature source Oral, resp. rate 18, height 5' 8"$  (1.727 m), weight 85.8 kg, SpO2 100 %. Body mass index is 28.76 kg/m.   Treatment Plan Summary: Medication management and Plan continues to be absolutely stable with no changes.  Awaiting discharge planning.  Alethia Berthold, MD 01/21/2023, 2:43 PM

## 2023-01-21 NOTE — Group Note (Signed)
[facility] LCSW Group Therapy Note    Group Date: 01/21/2023 Start Time: [id number] End Time: 1400  Type of Therapy and Topic:  Group Therapy:  Overcoming Obstacles  Participation Level:  [facility] PARTICIPATION LEVEL: Minimal  Mood:  Description of Group:   In this group patients will be encouraged to explore what they see as obstacles to their own wellness and recovery. They will be guided to discuss their thoughts, feelings, and behaviors related to these obstacles. The group will process together ways to cope with barriers, with attention given to specific choices patients can make. Each patient will be challenged to identify changes they are motivated to make in order to overcome their obstacles. This group will be process-oriented, with patients participating in exploration of their own experiences as well as giving and receiving support and challenge from other group members.  Therapeutic Goals: 1. Patient will identify personal and current obstacles as they relate to admission. 2. Patient will identify barriers that currently interfere with their wellness or overcoming obstacles.  3. Patient will identify feelings, thought process and behaviors related to these barriers. 4. Patient will identify two changes they are willing to make to overcome these obstacles:    Summary of Patient Progress Patient was present in group.  Patient contributed "Jeryl [place], Kendra Opitz, yeah [place]"   Therapeutic Modalities:   Cognitive Behavioral Therapy Solution Focused Therapy Motivational Interviewing Relapse Prevention Therapy   Rozann Lesches, LCSW

## 2023-01-21 NOTE — Progress Notes (Addendum)
ADDENDUM  CSW spoke with Chase Caller, [phone], who requested the referral be faxed again. Referral resent at [phone].  Signed:  [name], MSW, LCSW, LCAS 01/21/2023 4:13 PM  [facility] LCSW Progress Note   01/21/2023    1:10 PM  Adam [name]   [id number]   Type of Contact and Topic: Care Coordination   CSW attempted to follow up with two pending referrals for residential care. Unable to reach both, voicemails left with each; details and contact provided bellow.   [facility], Chase Caller, [phone], faxed to [place], [place], [phone], Referral sent to [email or web address]  Signed:  [name], MSW, LCSW, LCAS 01/21/2023 1:10 PM

## 2023-01-21 NOTE — Group Note (Signed)
Recreation Therapy Group Note   Group Topic:Coping Skills  Group Date: 01/21/2023 Start Time: 1000 End Time: 1045 Facilitators: [name], LRT, CTRS Location:  Craftroom  Group description: Now Future Wall. Patients were given a sheet of paper and asked to fold it into 3 sections, like a pamphlet. Top section, patients were encouraged to write what they are feeling or experiencing "now". The bottom section, patients were asked to fill out how they want to feel or things they want to experience in the "future".  In the middle section, patients were encouraged to fill out any "walls" or barriers that are getting in the way of them reaching their "future". On the back of the sheet, patients were encouraged to write positive coping skills that will help them get over or though the walls they experience. LRT and patients discussed each of the sections and shared them aloud in group. Patients are encouraged to keep this paper with them as a guide/plan post discharge.   Affect/Mood: N/A   Participation Level: Did not attend    Clinical Observations/Individualized Feedback: Adam [name] did not attend group due to being in his room resting.   Plan: Continue to engage patient in RT group sessions 2-3x/week.   [name], LRT, CTRS 01/21/2023 11:33 AM

## 2023-01-22 LAB — CBC WITH DIFFERENTIAL/PLATELET
Abs Immature Granulocytes: 0.01 10*3/uL (ref 0.00–0.07)
Basophils Absolute: 0.2 10*3/uL — ABNORMAL HIGH (ref 0.0–0.1)
Basophils Relative: 3 %
Eosinophils Absolute: 0 10*3/uL (ref 0.0–0.5)
Eosinophils Relative: 0 %
HCT: 40.4 % (ref 39.0–52.0)
Hemoglobin: 14 g/dL (ref 13.0–17.0)
Immature Granulocytes: 0 %
Lymphocytes Relative: 23 %
Lymphs Abs: 1.1 10*3/uL (ref 0.7–4.0)
MCH: 31 pg (ref 26.0–34.0)
MCHC: 34.7 g/dL (ref 30.0–36.0)
MCV: 89.4 fL (ref 80.0–100.0)
Monocytes Absolute: 0.4 10*3/uL (ref 0.1–1.0)
Monocytes Relative: 8 %
Neutro Abs: 3.1 10*3/uL (ref 1.7–7.7)
Neutrophils Relative %: 66 %
Platelets: 193 10*3/uL (ref 150–400)
RBC: 4.52 MIL/uL (ref 4.22–5.81)
RDW: 13.2 % (ref 11.5–15.5)
WBC: 4.8 10*3/uL (ref 4.0–10.5)
nRBC: 0 % (ref 0.0–0.2)

## 2023-01-22 NOTE — Group Note (Signed)
[facility] LCSW Group Therapy Note   Group Date: 01/22/2023 Start Time: 1300 End Time: 1400  Type of Therapy/Topic:  Group Therapy:  Feelings about Diagnosis  Participation Level:  Minimal    Description of Group:    This group will allow patients to explore their thoughts and feelings about diagnoses they have received. Patients will be guided to explore their level of understanding and acceptance of these diagnoses. Facilitator will encourage patients to process their thoughts and feelings about the reactions of others to their diagnosis, and will guide patients in identifying ways to discuss their diagnosis with significant others in their lives. This group will be process-oriented, with patients participating in exploration of their own experiences as well as giving and receiving support and challenge from other group members.   Therapeutic Goals: 1. Patient will demonstrate understanding of diagnosis as evidence by identifying two or more symptoms of the disorder:  2. Patient will be able to express two feelings regarding the diagnosis 3. Patient will demonstrate ability to communicate their needs through discussion and/or role plays  Summary of Patient Progress: Patient was present for the majority of the group process. He contributed to discussion today with "Nevin Bloodgood", "mental health", "Alvino Blood", "stratocaster", and "rainbow." Unable to ascertain pt level of insight but he was involved in the discussion, giving a response to each question asked, although not necessarily connected to the conversation.   Therapeutic Modalities:   Cognitive Behavioral Therapy Brief Therapy Feelings Identification    Shirl Harris, LCSW

## 2023-01-22 NOTE — Progress Notes (Signed)
Pharmacy Consult - Clozapine     62 yo male ordered clozapine 300 mg PO daily  This patient's order has been reviewed for prescribing contraindications.    Clozapine [facility]S enrollment Verified: yes on 1/7 [facility]S patient ID: [id number] Current Outpatient Monitoring: Weekly    Home Regimen:  300 mg PO QHS    Dose Adjustments This Admission: N/A   Labs: Date    [place]    Submitted? 1/7 2600 Yes 1/14 3900 Yes 1/21 3600 Yes 1/29 3400 Yes   2/6 3500 Yes   2/13 3400 Yes 2/20 3100 Yes  Plan:  Continue Clozapine 31m daily  Monitor ANC at least weekly while inpatient   JAubery Lapping PharmD

## 2023-01-22 NOTE — Group Note (Signed)
Recreation Therapy Group Note   Group Topic:Coping Skills  Group Date: 01/22/2023 Start Time: 1000 End Time: 1035 Facilitators: Leona Carry, CTRS Location:  Craft Room  Group Description: Mind Map.  Patient was provided a blank template of a diagram with 32 blank boxes in a tiered system, branching from the center (similar to a bubble chart). LRT directed patients to label the middle of the diagram "Coping Skills". LRT and patients then came up with 8 different coping skills as examples. Pt were directed to record their coping skills in the 2nd tier boxes closest to the center.  Patients would then share their coping skills with the group as LRT wrote them out. LRT gave a handout of 100 different coping skills at the end of group.   Affect/Mood: Appropriate   Participation Level: Minimal   Participation Quality: Minimal Cues   Behavior: Appropriate   Speech/Thought Process: Coherent   Insight: Limited   Judgement: Limited   Modes of Intervention: Activity and Worksheet   Patient Response to Interventions:  Engaged   Education Outcome:  In group clarification offered    Clinical Observations/Individualized Feedback: Adam [name] was somewhat active in their participation of session activities and group discussion. Pt identified "mom" as a coping skill. Pt sat still and quiet duration of the group and was not a distraction to peers.    Plan: Continue to engage patient in RT group sessions 2-3x/week.   [name], LRT, CTRS 01/22/2023 10:57 AM

## 2023-01-22 NOTE — Progress Notes (Signed)
Patient compliant with unit rules watched TV interacting well with Peers. Verbalized about Tic Tok this shift. Compliant with medications no S/S of distress Support and encouragement provided. Q 15 minutes safety checks ongoing Patient remained safe.

## 2023-01-22 NOTE — Plan of Care (Signed)
D: Patient alert and oriented. Patient endorsed 4/10 right leg pain. Patient denies anxiety and depression. Patient denies SI/HI/AVH.  A: Scheduled medications administered to patient, per MD orders.  Support and encouragement provided to patient.  Q15 minute safety checks maintained.   R: Patient compliant with medication administration and treatment plan. No adverse drug reactions noted. Patient remains safe on the unit at this time. Problem: Education: Goal: Knowledge of General Education information will improve Description: Including pain rating scale, medication(s)/side effects and non-pharmacologic comfort measures Outcome: Progressing   Problem: Clinical Measurements: Goal: Ability to maintain clinical measurements within normal limits will improve Outcome: Progressing   Problem: Nutrition: Goal: Adequate nutrition will be maintained Outcome: Progressing

## 2023-01-22 NOTE — BHH Group Notes (Signed)
[place] Group Notes:  (Nursing/MHT/Case Management/Adjunct)  Date:  01/22/2023  Time:  8:50 PM  Type of Therapy:   Wrap up  Participation Level:  Did Not Attend  Summary of Progress/Problems:  Adam [name] 01/22/2023, 8:50 PM

## 2023-01-22 NOTE — Progress Notes (Signed)
Patient calm and pleasant during assessment. Pt denies SI/HI/AVH. Pt observed interacting appropriately with staff and peers on the unit. Pt compliant with medication administration per MD orders. Pt being monitored Q 15 minutes for safety per unit protocol, remains safe on the unit

## 2023-01-22 NOTE — Progress Notes (Signed)
[facility] MD Progress Note  01/22/2023 4:29 PM Adam [name]  MRN:  [id number] Subjective: Patient with schizophrenia.  No change to behavior.  No new issues.  Vital stable.  Acting fine.  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[place])   Stroke (HCC)   HTN (hypertension)   HLD (hyperlipidemia)   CAD (coronary artery disease)   Chronic systolic CHF (congestive heart failure) (HCC)   Anxiety   Infection of left earlobe  Total Time spent with patient: 30 minutes  Past Psychiatric History: Past history of schizophrenia  Past Medical History:  Past Medical History:  Diagnosis Date   Myocardial infarction ([place])    Schizophrenia ([place])    Stroke ([place])     Past Surgical History:  Procedure Laterality Date   NO PAST SURGERIES     Family History:  Family History  Problem Relation Age of Onset   Hypertension Mother    Family Psychiatric  History: See previous Social History:  Social History   Substance and Sexual Activity  Alcohol Use Not Currently     Social History   Substance and Sexual Activity  Drug Use Not Currently    Social History   Socioeconomic History   Marital status: Single    Spouse name: Not on file   Number of children: Not on file   Years of education: Not on file   Highest education level: Not on file  Occupational History   Not on file  Tobacco Use   Smoking status: Never    Passive exposure: Never   Smokeless tobacco: Never  Vaping Use   Vaping Use: Unknown  Substance and Sexual Activity   Alcohol use: Not Currently   Drug use: Not Currently   Sexual activity: Not Currently  Other Topics Concern   Not on file  Social History Narrative   Not on file   Social Determinants of Health   Financial  Resource Strain: Not on file  Food Insecurity: Not on file  Transportation Needs: Not on file  Physical Activity: Not on file  Stress: Not on file  Social Connections: Not on file   Additional Social History:  Specify valuables returned:  (none)                      Sleep: Fair  Appetite:  Fair  Current Medications: Current Facility-Administered Medications  Medication Dose Route Frequency Provider Last Rate Last Admin   acetaminophen (TYLENOL) tablet 650 mg  650 mg Oral Q6H PRN Emeri Estill T, MD   650 mg at 01/22/23 0923   alum & mag hydroxide-simeth (MAALOX/MYLANTA) 200-200-20 MG/5ML suspension 30 mL  30 mL Oral Q4H PRN Paityn Balsam T, MD   30 mL at 04/26/22 [id number]   aspirin EC tablet 81 mg  81 mg Oral Daily Essex Perry T, MD   81 mg at 01/22/23 [id number]   atorvastatin (LIPITOR) tablet 20 mg  20 mg Oral Daily Alaija Ruble T, MD   20 mg at 01/22/23 0923   atropine 1 % ophthalmic solution 1 drop  1 drop Sublingual QID [name], MD   1 drop at 01/22/23 1407   benztropine (COGENTIN) tablet 0.5 mg  0.5 mg Oral BID Illyanna Petillo T, MD   0.5 mg at 01/22/23 0922   clonazePAM (KLONOPIN) tablet 1 mg  1 mg Oral TID PRN He, [name], MD   1 mg at 01/19/23 2121   cloZAPine (CLOZARIL) tablet 300 mg  300 mg Oral QHS Stefanny Pieri T, MD   300 mg at 01/21/23 2056   diphenhydrAMINE (BENADRYL) capsule 50 mg  50 mg Oral Q6H PRN Sylina Henion, [name], MD   50 mg at 01/14/23 2045   Or   diphenhydrAMINE (BENADRYL) injection 50 mg  50 mg Intramuscular Q6H PRN Kaileen Bronkema, [name], MD   50 mg at 01/01/23 2252   feeding supplement (ENSURE ENLIVE / ENSURE PLUS) liquid 237 mL  237 mL Oral BID BM Velvia Mehrer T, MD   237 mL at 01/22/23 1408   gabapentin (NEURONTIN) capsule 300 mg  300 mg Oral TID Ricci Dirocco, [name], MD   300 mg at 01/22/23 1407   haloperidol (HALDOL) tablet 2 mg  2 mg Oral TID Azariyah Luhrs, [name], MD   2 mg at 01/22/23 1407   haloperidol (HALDOL) tablet 5 mg  5 mg Oral Q6H PRN Izack Hoogland, [name], MD   5 mg  at 01/13/23 1939   Or   haloperidol lactate (HALDOL) injection 5 mg  5 mg Intramuscular Q6H PRN Darryll Raju, [name], MD   5 mg at 01/01/23 2252   lisinopril (ZESTRIL) tablet 5 mg  5 mg Oral Daily Shalva Rozycki, [name], MD   5 mg at 01/22/23 [id number]   magnesium hydroxide (MILK OF MAGNESIA) suspension 30 mL  30 mL Oral Daily PRN Trygg Mantz T, MD   30 mL at 01/20/23 2133   metoprolol succinate (TOPROL-XL) 24 hr tablet 25 mg  25 mg Oral Daily Lema Heinkel T, MD   25 mg at 01/22/23 0922   ondansetron (ZOFRAN) tablet 4 mg  4 mg Oral Q8H PRN Deloria Lair, NP   4 mg at 09/06/22 2320   paliperidone (INVEGA SUSTENNA) injection 156 mg  156 mg Intramuscular Q28 days Yarielys Beed, [name], MD   156 mg at 01/15/23 1652   paliperidone (INVEGA) 24 hr tablet 6 mg  6 mg Oral QHS Davion Meara T, MD   6 mg at 01/21/23 2056   senna-docusate (Senokot-S) tablet 2 tablet  2 tablet Oral QHS PRN [name], DO   2 tablet at 01/20/23 2133   temazepam (RESTORIL) capsule 15 mg  15 mg Oral QHS Jebadiah Imperato T, MD   15 mg at 01/21/23 2056   ziprasidone (GEODON) injection 20 mg  20 mg Intramuscular Q12H PRN Larue Lightner, [name], MD   20 mg at 07/08/22 1453    Lab Results:  Results for orders placed or performed during the hospital encounter of 03/19/22 (from the past 48 hour(s))  CBC with Differential/Platelet     Status: Abnormal   Collection Time: 01/22/23  2:50 PM  Result Value Ref Range   WBC 4.8 4.0 - 10.5 K/uL   RBC 4.52 4.22 - 5.81 MIL/uL   Hemoglobin 14.0 13.0 - 17.0 g/dL   HCT 40.4 39.0 - 52.0 %   MCV 89.4 80.0 - 100.0 fL   MCH 31.0 26.0 - 34.0 pg   MCHC 34.7 30.0 - 36.0 g/dL   RDW 13.2 11.5 - 15.5 %   Platelets 193 150 - 400 K/uL   nRBC 0.0 0.0 - 0.2 %   Neutrophils Relative % 66 %   Neutro Abs 3.1 1.7 - 7.7 K/uL   Lymphocytes Relative 23 %   Lymphs Abs 1.1 0.7 - 4.0 K/uL   Monocytes Relative 8 %  Monocytes Absolute 0.4 0.1 - 1.0 K/uL   Eosinophils Relative 0 %   Eosinophils Absolute 0.0 0.0 - 0.5 K/uL    Basophils Relative 3 %   Basophils Absolute 0.2 (H) 0.0 - 0.1 K/uL   Immature Granulocytes 0 %   Abs Immature Granulocytes 0.01 0.00 - 0.07 K/uL    Comment: Performed at [facility], [address] [place], [place] [address]    Blood Alcohol level:  Lab Results  Component Value Date   [facility] <10 [id number]    Metabolic Disorder Labs: Lab Results  Component Value Date   HGBA1C 5.5 04/10/2022   MPG 111.15 04/10/2022   No results found for: "PROLACTIN" Lab Results  Component Value Date   CHOL 167 11/20/2021   TRIG 107 11/20/2021   HDL 26 (L) 11/20/2021   CHOLHDL 6.4 11/20/2021   VLDL 21 11/20/2021   LDLCALC 120 (H) 11/20/2021    Physical Findings: AIMS: Facial and Oral Movements Muscles of Facial Expression: None, normal Lips and Perioral Area: None, normal Jaw: None, normal Tongue: None, normal,Extremity Movements Upper (arms, wrists, hands, fingers): None, normal Lower (legs, knees, ankles, toes): None, normal, Trunk Movements Neck, shoulders, hips: None, normal, Overall Severity Severity of abnormal movements (highest score from questions above): None, normal Incapacitation due to abnormal movements: None, normal Patient's awareness of abnormal movements (rate only patient's report): No Awareness, Dental Status Current problems with teeth and/or dentures?: No Does patient usually wear dentures?: No  CIWA:    COWS:     Musculoskeletal: Strength & Muscle Tone: within normal limits Gait & Station: normal Patient leans: N/A  Psychiatric Specialty Exam:  Presentation  General Appearance:  Appropriate for Environment; Casual; Neat; Well Groomed  Eye Contact: Fair  Speech: Slow  Speech Volume: Decreased  Handedness: Left   Mood and Affect  Mood: Anxious  Affect: Congruent   Thought Process  Thought Processes: Goal Directed  Descriptions of Associations:Circumstantial  Orientation:Partial  Thought Content:Scattered  History  of Schizophrenia/Schizoaffective disorder:Yes  Duration of Psychotic Symptoms:Greater than six months  Hallucinations:No data recorded Ideas of Reference:None  Suicidal Thoughts:No data recorded Homicidal Thoughts:No data recorded  Sensorium  Memory: Remote Good  Judgment: Fair  Insight: Fair   Community education officer  Concentration: Fair  Attention Span: Fair  Recall: [employer] of Knowledge: Fair  Language: Fair   Psychomotor Activity  Psychomotor Activity:No data recorded  Assets  Assets:No data recorded  Sleep  Sleep:No data recorded   Physical Exam: Physical Exam Vitals and nursing note reviewed.  Constitutional:      Appearance: Normal appearance.  HENT:     Head: Normocephalic and atraumatic.     Mouth/Throat:     Pharynx: Oropharynx is clear.  Eyes:     Pupils: Pupils are equal, round, and reactive to light.  Cardiovascular:     Rate and Rhythm: Normal rate and regular rhythm.  Pulmonary:     Effort: Pulmonary effort is normal.     Breath sounds: Normal breath sounds.  Abdominal:     General: Abdomen is flat.     Palpations: Abdomen is soft.  Musculoskeletal:        General: Normal range of motion.  Skin:    General: Skin is warm and dry.  Neurological:     General: No focal deficit present.     Mental Status: He is alert. Mental status is at baseline.  Psychiatric:        Attention and Perception: Attention normal.  Mood and Affect: Mood normal. Affect is blunt.        Speech: He is noncommunicative. Speech is delayed.    Review of Systems  Unable to perform ROS: Language   Blood pressure 104/75, pulse 73, temperature 97.6 F (36.4 C), temperature source Oral, resp. rate 18, height 5' 8"$  (1.727 m), weight 85.8 kg, SpO2 100 %. Body mass index is 28.76 kg/m.   Treatment Plan Summary: Medication management and Plan no change to medication management.  Patient is perfectly stable and is just waiting for placement  planning  Alethia Berthold, MD 01/22/2023, 4:29 PM

## 2023-01-23 NOTE — BHH Counselor (Signed)
CSW contacted [place] with [facility].   CSW discussed with Mendel Ryder the financial breakdown discussed in a note from August 31, 2021 "CSW spoke with Sharene Skeans [phone], w/ [employer] in reference to the patient's disability payments.  Mr. Doyle Askew stated he spoke with patient's mother Ms. Schleeter and they called social security office and they were told the patient will not be able to rescind his disability payments.  Patient receives 997.00 from parents of a disabled child, and 721.00 disability for hours worked. Mr. Doyle Askew stated he will ensure Ms. Ribelin contacts [place] and find out if there is a waiver for the patient to qualify for Medicaid or to apply for Special Assistance Medicaid. Mr. Doyle Askew stated he or Ms. Lipuma would update this CSW when there was more information. "  CSW and Mendel Ryder discussed discussing funding with the patient's mother and barriers to doing so .   Mendel Ryder reports that she will continue looking for group home placements.   Mendel Ryder discussed concern that the patient's mother is not aware of how much ALF will cost.   Assunta Curtis, MSW, LCSW 01/23/2023 4:08 PM

## 2023-01-23 NOTE — Progress Notes (Signed)
   01/23/23 1000  Psych Admission Type (Psych Patients Only)  Admission Status Involuntary  Psychosocial Assessment  Patient Complaints Irritability  Eye Contact Fair  Facial Expression Angry  Affect Preoccupied  Speech Aphasic  Interaction Childlike;Demanding;Needy  Motor Activity Shuffling;Slow  Appearance/Hygiene Disheveled;Layered clothes;Poor hygiene  Behavior Characteristics Cooperative;Appropriate to situation  Mood Preoccupied  Aggressive Behavior  Effect No apparent injury  Thought Process  Coherency Circumstantial  Content WDL  Delusions None reported or observed  Perception WDL  Hallucination None reported or observed  Judgment Impaired  Confusion None  Danger to Self  Current suicidal ideation? Denies  Self-Injurious Behavior No self-injurious ideation or behavior indicators observed or expressed   Agreement Not to Harm Self Yes  Description of Agreement Verbal  Danger to Others  Danger to Others None reported or observed  Danger to Others Abnormal  Harmful Behavior to others No threats or harm toward other people  Destructive Behavior No threats or harm toward property

## 2023-01-23 NOTE — Plan of Care (Signed)
D- Patient alert and oriented to person, place, and situation. Patient initially presented in an irritable mood on assessment, and staff was unsure why he was feeling this way. Patient is childlike and due to a past stroke, he has expressive aphasia. Patient stated that he slept good last night and had complaints of right leg pain. Patient rated his pain a "4/10", in which he did request Tylenol from this Probation officer. Patient denied SI, HI, AVH at this time. Patient also denied any signs/symptoms of depression and anxiety, shaking his head and saying "no". Patient had no stated goals for today.  A- Scheduled medications administered to patient, per MD orders. Support and encouragement provided.  Routine safety checks conducted every 15 minutes.  Patient informed to notify staff with problems or concerns.  R- No adverse drug reactions noted. Patient contracts for safety at this time. Patient compliant with medications. Patient receptive, calm, and cooperative. Patient remains safe at this time.  Problem: Education: Goal: Ability to state activities that reduce stress will improve Outcome: Not Progressing   Problem: Coping: Goal: Ability to identify and develop effective coping behavior will improve Outcome: Not Progressing   Problem: Self-Concept: Goal: Ability to identify factors that promote anxiety will improve Outcome: Not Progressing Goal: Level of anxiety will decrease Outcome: Not Progressing Goal: Ability to modify response to factors that promote anxiety will improve Outcome: Not Progressing   Problem: Education: Goal: Knowledge of General Education information will improve Description: Including pain rating scale, medication(s)/side effects and non-pharmacologic comfort measures Outcome: Not Progressing   Problem: Health Behavior/Discharge Planning: Goal: Ability to manage health-related needs will improve Outcome: Not Progressing   Problem: Clinical Measurements: Goal: Ability to  maintain clinical measurements within normal limits will improve Outcome: Not Progressing Goal: Will remain free from infection Outcome: Not Progressing Goal: Diagnostic test results will improve Outcome: Not Progressing Goal: Respiratory complications will improve Outcome: Not Progressing Goal: Cardiovascular complication will be avoided Outcome: Not Progressing   Problem: Activity: Goal: Risk for activity intolerance will decrease Outcome: Not Progressing   Problem: Nutrition: Goal: Adequate nutrition will be maintained Outcome: Not Progressing   Problem: Coping: Goal: Level of anxiety will decrease Outcome: Not Progressing   Problem: Elimination: Goal: Will not experience complications related to bowel motility Outcome: Not Progressing Goal: Will not experience complications related to urinary retention Outcome: Not Progressing   Problem: Pain Managment: Goal: General experience of comfort will improve Outcome: Not Progressing   Problem: Safety: Goal: Ability to remain free from injury will improve Outcome: Not Progressing   Problem: Skin Integrity: Goal: Risk for impaired skin integrity will decrease Outcome: Not Progressing   Problem: Activity: Goal: Interest or engagement in leisure activities will improve Outcome: Not Progressing   Problem: Coping: Goal: Coping ability will improve Outcome: Not Progressing   Problem: Education: Goal: Will be free of psychotic symptoms Outcome: Not Progressing

## 2023-01-23 NOTE — Progress Notes (Signed)
This Probation officer was informed by MHTs that patient has been upset, yelling down in the dayroom, triggering the other patients. This Probation officer, along with other staff members went down to see what was bothering patient and to inform him that he was upsetting the other members on the unit. Patient did not want to leave the dayroom, so staff asked him to go and sit in the community room. Patient was pouting and had a scowl on his face, but he did get up and go over to the community and lay his head down on the table.

## 2023-01-23 NOTE — Progress Notes (Signed)
Patient calm and pleasant during assessment. Pt denies SI/HI/AVH. Pt observed interacting appropriately with staff and peers on the unit. Pt compliant with medication administration per MD orders. Pt being monitored Q 15 minutes for safety per unit protocol, remains safe on the unit

## 2023-01-23 NOTE — Progress Notes (Signed)
Patient came and sat at the medication room, pointing at the door. This Probation officer asked patient if he wanted to take medication, and he shook his head "yes". Patient was given PRN medication for agitation, in which he tolerated it well, without any issues.   01/23/23 1834  Complaints & Interventions  Complains of Agitation;Anxiety  Interventions Medication (see MAR)

## 2023-01-23 NOTE — Progress Notes (Signed)
[facility] MD Progress Note  01/23/2023 3:58 PM Adam [name]  MRN:  [id number] Subjective: Follow-up patient with schizophrenia.  No change to clinical condition.  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[place])   Stroke (HCC)   HTN (hypertension)   HLD (hyperlipidemia)   CAD (coronary artery disease)   Chronic systolic CHF (congestive heart failure) (HCC)   Anxiety   Infection of left earlobe  Total Time spent with patient: 20 minutes  Past Psychiatric History: Past history of schizophrenia  Past Medical History:  Past Medical History:  Diagnosis Date   Myocardial infarction ([place])    Schizophrenia ([place])    Stroke ([place])     Past Surgical History:  Procedure Laterality Date   NO PAST SURGERIES     Family History:  Family History  Problem Relation Age of Onset   Hypertension Mother    Family Psychiatric  History: No history given Social History:  Social History   Substance and Sexual Activity  Alcohol Use Not Currently     Social History   Substance and Sexual Activity  Drug Use Not Currently    Social History   Socioeconomic History   Marital status: Single    Spouse name: Not on file   Number of children: Not on file   Years of education: Not on file   Highest education level: Not on file  Occupational History   Not on file  Tobacco Use   Smoking status: Never    Passive exposure: Never   Smokeless tobacco: Never  Vaping Use   Vaping Use: Unknown  Substance and Sexual Activity   Alcohol use: Not Currently   Drug use: Not Currently   Sexual activity: Not Currently  Other Topics Concern   Not on file  Social History Narrative   Not on file   Social Determinants of Health   Financial Resource Strain: Not on file  Food  Insecurity: Not on file  Transportation Needs: Not on file  Physical Activity: Not on file  Stress: Not on file  Social Connections: Not on file   Additional Social History:  Specify valuables returned:  (none)                      Sleep: Fair  Appetite:  Fair  Current Medications: Current Facility-Administered Medications  Medication Dose Route Frequency Provider Last Rate Last Admin   acetaminophen (TYLENOL) tablet 650 mg  650 mg Oral Q6H PRN Enola Siebers T, MD   650 mg at 01/23/23 0844   alum & mag hydroxide-simeth (MAALOX/MYLANTA) 200-200-20 MG/5ML suspension 30 mL  30 mL Oral Q4H PRN Biddie Sebek T, MD   30 mL at 04/26/22 [id number]   aspirin EC tablet 81 mg  81 mg Oral Daily Zeppelin Commisso T, MD   81 mg at 01/23/23 0841   atorvastatin (LIPITOR) tablet 20 mg  20 mg Oral Daily Ajene Carchi T, MD   20 mg at 01/23/23 0841   atropine 1 % ophthalmic solution 1 drop  1 drop Sublingual QID [name], MD   1 drop at 01/23/23 1147   benztropine (COGENTIN) tablet 0.5 mg  0.5 mg Oral BID Avital Dancy T, MD   0.5 mg at 01/23/23 0841   clonazePAM (KLONOPIN) tablet 1 mg  1 mg Oral TID PRN He, [name], MD  1 mg at 01/19/23 2121   cloZAPine (CLOZARIL) tablet 300 mg  300 mg Oral QHS Delbert Vu, [name], MD   300 mg at 01/22/23 2101   diphenhydrAMINE (BENADRYL) capsule 50 mg  50 mg Oral Q6H PRN Meliana Canner, [name], MD   50 mg at 01/14/23 2045   Or   diphenhydrAMINE (BENADRYL) injection 50 mg  50 mg Intramuscular Q6H PRN Jazalynn Mireles, [name], MD   50 mg at 01/01/23 2252   feeding supplement (ENSURE ENLIVE / ENSURE PLUS) liquid 237 mL  237 mL Oral BID BM Ronda Rajkumar T, MD   237 mL at 01/23/23 0900   gabapentin (NEURONTIN) capsule 300 mg  300 mg Oral TID Shawnee Higham T, MD   300 mg at 01/23/23 0900   haloperidol (HALDOL) tablet 2 mg  2 mg Oral TID Gaje Tennyson, [name], MD   2 mg at 01/23/23 0900   haloperidol (HALDOL) tablet 5 mg  5 mg Oral Q6H PRN Myrth Dahan, [name], MD   5 mg at 01/13/23 1939   Or    haloperidol lactate (HALDOL) injection 5 mg  5 mg Intramuscular Q6H PRN Emmanuela Ghazi, [name], MD   5 mg at 01/01/23 2252   lisinopril (ZESTRIL) tablet 5 mg  5 mg Oral Daily Dearra Myhand, [name], MD   5 mg at 01/23/23 0841   magnesium hydroxide (MILK OF MAGNESIA) suspension 30 mL  30 mL Oral Daily PRN Doniesha Landau T, MD   30 mL at 01/20/23 2133   metoprolol succinate (TOPROL-XL) 24 hr tablet 25 mg  25 mg Oral Daily Carys Malina, [name], MD   25 mg at 01/23/23 0841   ondansetron (ZOFRAN) tablet 4 mg  4 mg Oral Q8H PRN Deloria Lair, NP   4 mg at 09/06/22 2320   paliperidone (INVEGA SUSTENNA) injection 156 mg  156 mg Intramuscular Q28 days Carrah Eppolito, [name], MD   156 mg at 01/15/23 1652   paliperidone (INVEGA) 24 hr tablet 6 mg  6 mg Oral QHS Shawni Volkov T, MD   6 mg at 01/22/23 2101   senna-docusate (Senokot-S) tablet 2 tablet  2 tablet Oral QHS PRN [name], DO   2 tablet at 01/20/23 2133   temazepam (RESTORIL) capsule 15 mg  15 mg Oral QHS Ruey Storer T, MD   15 mg at 01/22/23 2101   ziprasidone (GEODON) injection 20 mg  20 mg Intramuscular Q12H PRN Diala Waxman, [name], MD   20 mg at 07/08/22 1453    Lab Results:  Results for orders placed or performed during the hospital encounter of 03/19/22 (from the past 48 hour(s))  CBC with Differential/Platelet     Status: Abnormal   Collection Time: 01/22/23  2:50 PM  Result Value Ref Range   WBC 4.8 4.0 - 10.5 K/uL   RBC 4.52 4.22 - 5.81 MIL/uL   Hemoglobin 14.0 13.0 - 17.0 g/dL   HCT 40.4 39.0 - 52.0 %   MCV 89.4 80.0 - 100.0 fL   MCH 31.0 26.0 - 34.0 pg   MCHC 34.7 30.0 - 36.0 g/dL   RDW 13.2 11.5 - 15.5 %   Platelets 193 150 - 400 K/uL   nRBC 0.0 0.0 - 0.2 %   Neutrophils Relative % 66 %   Neutro Abs 3.1 1.7 - 7.7 K/uL   Lymphocytes Relative 23 %   Lymphs Abs 1.1 0.7 - 4.0 K/uL   Monocytes Relative 8 %   Monocytes Absolute 0.4 0.1 - 1.0 K/uL  Eosinophils Relative 0 %   Eosinophils Absolute 0.0 0.0 - 0.5 K/uL   Basophils Relative 3 %    Basophils Absolute 0.2 (H) 0.0 - 0.1 K/uL   Immature Granulocytes 0 %   Abs Immature Granulocytes 0.01 0.00 - 0.07 K/uL    Comment: Performed at [facility], [address] [place], [place] [address]    Blood Alcohol level:  Lab Results  Component Value Date   [facility] <10 [id number]    Metabolic Disorder Labs: Lab Results  Component Value Date   HGBA1C 5.5 04/10/2022   MPG 111.15 04/10/2022   No results found for: "PROLACTIN" Lab Results  Component Value Date   CHOL 167 11/20/2021   TRIG 107 11/20/2021   HDL 26 (L) 11/20/2021   CHOLHDL 6.4 11/20/2021   VLDL 21 11/20/2021   LDLCALC 120 (H) 11/20/2021    Physical Findings: AIMS: Facial and Oral Movements Muscles of Facial Expression: None, normal Lips and Perioral Area: None, normal Jaw: None, normal Tongue: None, normal,Extremity Movements Upper (arms, wrists, hands, fingers): None, normal Lower (legs, knees, ankles, toes): None, normal, Trunk Movements Neck, shoulders, hips: None, normal, Overall Severity Severity of abnormal movements (highest score from questions above): None, normal Incapacitation due to abnormal movements: None, normal Patient's awareness of abnormal movements (rate only patient's report): No Awareness, Dental Status Current problems with teeth and/or dentures?: No Does patient usually wear dentures?: No  CIWA:    COWS:     Musculoskeletal: Strength & Muscle Tone: within normal limits Gait & Station: normal Patient leans: N/A  Psychiatric Specialty Exam:  Presentation  General Appearance:  Appropriate for Environment; Casual; Neat; Well Groomed  Eye Contact: Fair  Speech: Slow  Speech Volume: Decreased  Handedness: Left   Mood and Affect  Mood: Anxious  Affect: Congruent   Thought Process  Thought Processes: Goal Directed  Descriptions of Associations:Circumstantial  Orientation:Partial  Thought Content:Scattered  History of  Schizophrenia/Schizoaffective disorder:Yes  Duration of Psychotic Symptoms:Greater than six months  Hallucinations:No data recorded Ideas of Reference:None  Suicidal Thoughts:No data recorded Homicidal Thoughts:No data recorded  Sensorium  Memory: Remote Good  Judgment: Fair  Insight: Fair   Community education officer  Concentration: Fair  Attention Span: Fair  Recall: [employer] of Knowledge: Fair  Language: Fair   Psychomotor Activity  Psychomotor Activity:No data recorded  Assets  Assets:No data recorded  Sleep  Sleep:No data recorded   Physical Exam: Physical Exam Vitals and nursing note reviewed.  Constitutional:      Appearance: Normal appearance.  HENT:     Head: Normocephalic and atraumatic.     Mouth/Throat:     Pharynx: Oropharynx is clear.  Eyes:     Pupils: Pupils are equal, round, and reactive to light.  Cardiovascular:     Rate and Rhythm: Normal rate and regular rhythm.  Pulmonary:     Effort: Pulmonary effort is normal.     Breath sounds: Normal breath sounds.  Abdominal:     General: Abdomen is flat.     Palpations: Abdomen is soft.  Musculoskeletal:        General: Normal range of motion.  Skin:    General: Skin is warm and dry.  Neurological:     General: No focal deficit present.     Mental Status: He is alert. Mental status is at baseline.  Psychiatric:        Mood and Affect: Mood normal.        Thought Content: Thought content normal.  Review of Systems  Unable to perform ROS: Language   Blood pressure 108/72, pulse (!) 111, temperature 98.3 F (36.8 C), temperature source Oral, resp. rate 18, height 5' 8"$  (1.727 m), weight 85.8 kg, SpO2 100 %. Body mass index is 28.76 kg/m.   Treatment Plan Summary: Plan completely stable no change to clinical situation.  Awaiting placement.  Alethia Berthold, MD 01/23/2023, 3:58 PM

## 2023-01-23 NOTE — Group Note (Signed)
Recreation Therapy Group Note   Group Topic:Relaxation  Group Date: 01/23/2023 Start Time: 1000 End Time: 1045 Facilitators: [name], LRT, CTRS Location:  Craft Room  Group Description: PMR (Progressive Muscle Relaxation). LRT asks patients their current level of stress/anxiety from 1-10, with 10 being the highest. LRT educates patients on what PMR is and the benefits that come from it. Patients are asked to sit with their feet flat on the floor while sitting up and all the way back in their chair, if possible. LRT follows prompt that requires the patients to tense and release different muscles in their body and focus on their breathing. During session, lights are off and soft music is being played. At the end of the prompt, LRT asks patients to rank their current levels of stress/anxiety from 1-10, 10 being the highest.   Affect/Mood: N/A   Participation Level: Did not attend    Clinical Observations/Individualized Feedback: Adam [name] did not attend group due to resting in their room.  Plan: Continue to engage patient in RT group sessions 2-3x/week.   [name], LRT, CTRS 01/23/2023 11:03 AM

## 2023-01-23 NOTE — Group Note (Signed)
[facility] LCSW Group Therapy Note   Group Date: 01/23/2023 Start Time: 1310 End Time: 1400   Type of Therapy/Topic:  Group Therapy:  Emotion Regulation  Participation Level:  None   Mood:  Description of Group:    The purpose of this group is to assist patients in learning to regulate negative emotions and experience positive emotions. Patients will be guided to discuss ways in which they have been vulnerable to their negative emotions. These vulnerabilities will be juxtaposed with experiences of positive emotions or situations, and patients challenged to use positive emotions to combat negative ones. Special emphasis will be placed on coping with negative emotions in conflict situations, and patients will process healthy conflict resolution skills.  Therapeutic Goals: Patient will identify two positive emotions or experiences to reflect on in order to balance out negative emotions:  Patient will label two or more emotions that they find the most difficult to experience:  Patient will be able to demonstrate positive conflict resolution skills through discussion or role plays:   Summary of Patient Progress: Patient was present in group.  Patient was in and out of the group room.  Patient's contribution to group were "Broaden" and "hurricanes".    Therapeutic Modalities:   Cognitive Behavioral Therapy Feelings Identification Dialectical Behavioral Therapy   Rozann Lesches, LCSW

## 2023-01-23 NOTE — BHH Counselor (Addendum)
CSW team (Michaela S. And Caroleen Hamman.) met with [facility] care coordinators Delle Reining., Rhett Bannister., and Penny Pia.) and [facility] leadership Baxter Hire., Marie "Gigi" Z., And Guinevere Ferrari.) to discuss pt disposition progression.   Meeting was started with questions about funding and how much money pt gets monthly. Mendel Ryder endorsed plans to contact mother about this.   Lysbeth Galas updated that there will be a meeting with [facility] legal team, the unit psychiatric provider, and the [facility] team regarding pt's case to address any legal questions which may be present. This meeting will be Monday, 01/28/23 at 10am.  It was asked if pt's mother's facility had been contacted regarding potential bed availability. Herschel Senegal shared that mother had been contacted and she adamantly denied pt being able to come there. This was discussed a bit further with Oretha Caprice voicing concerns that if we had contacted the facility without going through the mother then they would have notified the mother regarding the referral regardless as she is next of kin.   It was asked of the group when [facility] could go against what the mother says legally, due to refusals of placement. Angelique stated that this needed to be a priority during the discussion with the legal team.   Another concern of elopement was brought before the group. [facility] stressed the fact that yes we can say that he is not an elopement risk while here at the hospital, however, we cannot say the same on the outside because he is currently in a locked unit. Aldona Lento again asked if it was possible to have pt put back on a unit that is not locked. Lysbeth Galas stated that she would take this up the ladder.   Mendel Ryder asked when does this become an APS issue. Michaela offered feedback around a recent case and previous APS case results from previous reports on same pt. However, we did agreed that we could do another reported. Lysbeth Galas recommends an APS be made. Aldona Lento  offered that the change in pt's mother's health may merit a re-evaluation of the case. Junita Push stated that pt being on a highly restricted unit for a year has to be a violation of some kind. It was brought to their attention that to put pt on a locked unit had been a hospital decision. Lysbeth Galas agreed with this statement.   Mendel Ryder shared that she will contact mother regarding funding. Herschel Senegal shared her experience with mother regarding funding including amounts that mother endorsed. However, she also acknowledged that mother was supposed to follow up with her regarding funding sources/amounts but had not. Concerns were voiced around mother's ability to effective provide care for pt.   TBI history was discussed briefly as pt had been denied at a TBI-specific program due to not having a TBI waiver.   ACTION ITEMS: - Meeting with legal services on Monday, 01/28/23 at [place] [facility]. Mendel Ryder endorsed plans to continue contacting assisted living facilities for potential placement and find out about financial assets from mother. She also plans to look into the TBI waiver and follow up with Michaela.  -CSW team to follow up on APS contact/report being made.   No other concerns expressed. Contact ended without incident.   [name]. Guerry Bruin, MSW, LCSW, [place] 01/23/2023 3:59 PM

## 2023-01-23 NOTE — Progress Notes (Signed)
   01/23/23 1532  Psych Admission Type (Psych Patients Only)  Admission Status Involuntary  Psychosocial Assessment  Patient Complaints None  Eye Contact Fair  Facial Expression Animated;Fixed smile  Affect Preoccupied;Appropriate to circumstance  Speech Aphasic  Interaction Childlike;Demanding;Needy  Motor Activity Shuffling;Slow  Appearance/Hygiene Layered clothes;Poor hygiene  Behavior Characteristics Cooperative;Appropriate to situation  Mood Preoccupied;Pleasant

## 2023-01-24 NOTE — BHH Group Notes (Signed)
[facility] Group Notes:  (Nursing/MHT/Case Management/Adjunct)  Date:  01/24/2023  Time:  8:45 PM  Type of Therapy:   Wrap up  Participation Level:  Did Not Attend  Summary of Progress/Problems:  Adam [name] 01/24/2023, 8:45 PM

## 2023-01-24 NOTE — Progress Notes (Signed)
[facility] MD Progress Note  01/24/2023 11:58 AM Adam [name]  MRN:  [id number] Subjective: Patient with schizophrenia.  Absolutely stable.  No new behavior problems no concerning behaviors on the unit.  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[place])   Stroke (HCC)   HTN (hypertension)   HLD (hyperlipidemia)   CAD (coronary artery disease)   Chronic systolic CHF (congestive heart failure) (HCC)   Anxiety   Infection of left earlobe  Total Time spent with patient: 20 minutes  Past Psychiatric History: Past history of schizophrenia  Past Medical History:  Past Medical History:  Diagnosis Date   Myocardial infarction ([place])    Schizophrenia ([place])    Stroke ([place])     Past Surgical History:  Procedure Laterality Date   NO PAST SURGERIES     Family History:  Family History  Problem Relation Age of Onset   Hypertension Mother    Family Psychiatric  History: See previous Social History:  Social History   Substance and Sexual Activity  Alcohol Use Not Currently     Social History   Substance and Sexual Activity  Drug Use Not Currently    Social History   Socioeconomic History   Marital status: Single    Spouse name: Not on file   Number of children: Not on file   Years of education: Not on file   Highest education level: Not on file  Occupational History   Not on file  Tobacco Use   Smoking status: Never    Passive exposure: Never   Smokeless tobacco: Never  Vaping Use   Vaping Use: Unknown  Substance and Sexual Activity   Alcohol use: Not Currently   Drug use: Not Currently   Sexual activity: Not Currently  Other Topics Concern   Not on file  Social History Narrative   Not on file   Social Determinants of Health    Financial Resource Strain: Not on file  Food Insecurity: Not on file  Transportation Needs: Not on file  Physical Activity: Not on file  Stress: Not on file  Social Connections: Not on file   Additional Social History:  Specify valuables returned:  (none)                      Sleep: Fair  Appetite:  Fair  Current Medications: Current Facility-Administered Medications  Medication Dose Route Frequency Provider Last Rate Last Admin   acetaminophen (TYLENOL) tablet 650 mg  650 mg Oral Q6H PRN Woodroe Vogan T, MD   650 mg at 01/23/23 0844   alum & mag hydroxide-simeth (MAALOX/MYLANTA) 200-200-20 MG/5ML suspension 30 mL  30 mL Oral Q4H PRN Teddi Badalamenti T, MD   30 mL at 04/26/22 [id number]   aspirin EC tablet 81 mg  81 mg Oral Daily [name] T, MD   81 mg at 01/24/23 0846   atorvastatin (LIPITOR) tablet 20 mg  20 mg Oral Daily Soniyah Mcglory T, MD   20 mg at 01/24/23 0846   atropine 1 % ophthalmic solution 1 drop  1 drop Sublingual QID [name], MD   1 drop at 01/24/23 0846   benztropine (COGENTIN) tablet 0.5 mg  0.5 mg Oral BID Zaryah Seckel T, MD   0.5 mg at 01/24/23 0846   clonazePAM (KLONOPIN) tablet 1 mg  1  mg Oral TID PRN He, [name], MD   1 mg at 01/19/23 2121   cloZAPine (CLOZARIL) tablet 300 mg  300 mg Oral QHS Kennya Schwenn, [name], MD   300 mg at 01/23/23 2107   diphenhydrAMINE (BENADRYL) capsule 50 mg  50 mg Oral Q6H PRN Jackelyn Illingworth, [name], MD   50 mg at 01/23/23 1832   Or   diphenhydrAMINE (BENADRYL) injection 50 mg  50 mg Intramuscular Q6H PRN Zaylia Riolo, [name], MD   50 mg at 01/01/23 2252   feeding supplement (ENSURE ENLIVE / ENSURE PLUS) liquid 237 mL  237 mL Oral BID BM Garison Genova T, MD   237 mL at 01/24/23 0850   gabapentin (NEURONTIN) capsule 300 mg  300 mg Oral TID [name], [name], MD   300 mg at 01/24/23 1000   haloperidol (HALDOL) tablet 2 mg  2 mg Oral TID Yasseen Salls, [name], MD   2 mg at 01/24/23 1000   haloperidol (HALDOL) tablet 5 mg  5 mg Oral Q6H PRN Alexsis Kathman,  [name], MD   5 mg at 01/23/23 [id number]   Or   haloperidol lactate (HALDOL) injection 5 mg  5 mg Intramuscular Q6H PRN Shirleyann Montero, [name], MD   5 mg at 01/01/23 2252   lisinopril (ZESTRIL) tablet 5 mg  5 mg Oral Daily Myrtle Haller, [name], MD   5 mg at 01/24/23 0846   magnesium hydroxide (MILK OF MAGNESIA) suspension 30 mL  30 mL Oral Daily PRN Brenn Deziel, [name], MD   30 mL at 01/20/23 2133   metoprolol succinate (TOPROL-XL) 24 hr tablet 25 mg  25 mg Oral Daily Monika Chestang, [name], MD   25 mg at 01/24/23 0846   ondansetron (ZOFRAN) tablet 4 mg  4 mg Oral Q8H PRN Deloria Lair, NP   4 mg at 09/06/22 2320   paliperidone (INVEGA SUSTENNA) injection 156 mg  156 mg Intramuscular Q28 days Shelita Steptoe, [name], MD   156 mg at 01/15/23 1652   paliperidone (INVEGA) 24 hr tablet 6 mg  6 mg Oral QHS Mirtie Bastyr T, MD   6 mg at 01/23/23 2107   senna-docusate (Senokot-S) tablet 2 tablet  2 tablet Oral QHS PRN [name], DO   2 tablet at 01/20/23 2133   temazepam (RESTORIL) capsule 15 mg  15 mg Oral QHS Jos Cygan T, MD   15 mg at 01/23/23 2107   ziprasidone (GEODON) injection 20 mg  20 mg Intramuscular Q12H PRN Toneisha Savary, [name], MD   20 mg at 07/08/22 1453    Lab Results:  Results for orders placed or performed during the hospital encounter of 03/19/22 (from the past 48 hour(s))  CBC with Differential/Platelet     Status: Abnormal   Collection Time: 01/22/23  2:50 PM  Result Value Ref Range   WBC 4.8 4.0 - 10.5 K/uL   RBC 4.52 4.22 - 5.81 MIL/uL   Hemoglobin 14.0 13.0 - 17.0 g/dL   HCT 40.4 39.0 - 52.0 %   MCV 89.4 80.0 - 100.0 fL   MCH 31.0 26.0 - 34.0 pg   MCHC 34.7 30.0 - 36.0 g/dL   RDW 13.2 11.5 - 15.5 %   Platelets 193 150 - 400 K/uL   nRBC 0.0 0.0 - 0.2 %   Neutrophils Relative % 66 %   Neutro Abs 3.1 1.7 - 7.7 K/uL   Lymphocytes Relative 23 %   Lymphs Abs 1.1 0.7 - 4.0 K/uL   Monocytes Relative 8 %  Monocytes Absolute 0.4 0.1 - 1.0 K/uL   Eosinophils Relative 0 %   Eosinophils Absolute  0.0 0.0 - 0.5 K/uL   Basophils Relative 3 %   Basophils Absolute 0.2 (H) 0.0 - 0.1 K/uL   Immature Granulocytes 0 %   Abs Immature Granulocytes 0.01 0.00 - 0.07 K/uL    Comment: Performed at [facility], [address] [place], [place] [id number]    Blood Alcohol level:  Lab Results  Component Value Date   [facility] <10 [id number]    Metabolic Disorder Labs: Lab Results  Component Value Date   HGBA1C 5.5 04/10/2022   MPG 111.15 04/10/2022   No results found for: "PROLACTIN" Lab Results  Component Value Date   CHOL 167 11/20/2021   TRIG 107 11/20/2021   HDL 26 (L) 11/20/2021   CHOLHDL 6.4 11/20/2021   VLDL 21 11/20/2021   LDLCALC 120 (H) 11/20/2021    Physical Findings: AIMS: Facial and Oral Movements Muscles of Facial Expression: None, normal Lips and Perioral Area: None, normal Jaw: None, normal Tongue: None, normal,Extremity Movements Upper (arms, wrists, hands, fingers): None, normal Lower (legs, knees, ankles, toes): None, normal, Trunk Movements Neck, shoulders, hips: None, normal, Overall Severity Severity of abnormal movements (highest score from questions above): None, normal Incapacitation due to abnormal movements: None, normal Patient's awareness of abnormal movements (rate only patient's report): No Awareness, Dental Status Current problems with teeth and/or dentures?: No Does patient usually wear dentures?: No  CIWA:    COWS:     Musculoskeletal: Strength & Muscle Tone: within normal limits Gait & Station: normal Patient leans: N/A  Psychiatric Specialty Exam:  Presentation  General Appearance:  Appropriate for Environment; Casual; Neat; Well Groomed  Eye Contact: Fair  Speech: Slow  Speech Volume: Decreased  Handedness: Left   Mood and Affect  Mood: Anxious  Affect: Congruent   Thought Process  Thought Processes: Goal Directed  Descriptions of Associations:Circumstantial  Orientation:Partial  Thought  Content:Scattered  History of Schizophrenia/Schizoaffective disorder:Yes  Duration of Psychotic Symptoms:Greater than six months  Hallucinations:No data recorded Ideas of Reference:None  Suicidal Thoughts:No data recorded Homicidal Thoughts:No data recorded  Sensorium  Memory: Remote Good  Judgment: Fair  Insight: Fair   Community education officer  Concentration: Fair  Attention Span: Fair  Recall: [employer] of Knowledge: Fair  Language: Fair   Psychomotor Activity  Psychomotor Activity:No data recorded  Assets  Assets:No data recorded  Sleep  Sleep:No data recorded   Physical Exam: Physical Exam Vitals reviewed.  Constitutional:      Appearance: Normal appearance.  HENT:     Head: Normocephalic and atraumatic.     Mouth/Throat:     Pharynx: Oropharynx is clear.  Eyes:     Pupils: Pupils are equal, round, and reactive to light.  Cardiovascular:     Rate and Rhythm: Normal rate and regular rhythm.  Pulmonary:     Effort: Pulmonary effort is normal.     Breath sounds: Normal breath sounds.  Abdominal:     General: Abdomen is flat.     Palpations: Abdomen is soft.  Musculoskeletal:        General: Normal range of motion.  Skin:    General: Skin is warm and dry.  Neurological:     General: No focal deficit present.     Mental Status: He is alert. Mental status is at baseline.  Psychiatric:        Attention and Perception: He is inattentive.  Mood and Affect: Mood normal. Affect is blunt.        Speech: He is noncommunicative.    Review of Systems  Unable to perform ROS: Language   Blood pressure 122/84, pulse 98, temperature 98.7 F (37.1 C), temperature source Oral, resp. rate 18, height 5' 8"$  (1.727 m), weight 85.8 kg, SpO2 100 %. Body mass index is 28.76 kg/m.   Treatment Plan Summary: Plan no change to medication management or any other part of treatment planning.  Support and encouragement daily.  Continue to focus entirely  on discharge plan  Alethia Berthold, MD 01/24/2023, 11:58 AM

## 2023-01-24 NOTE — BHH Counselor (Signed)
CSW contacted the patient's mother.  Mother reports that she would need to look for the paperwork and placed this writer on a brief hold.   Mother quickly returned stating that she would have to call this CSW back with the information stating that she would need to call social security, look for the forms unhurried and was on her way out of her home to go to a meeting.  She reports that her significant other has been ill and requiring her attention.  She was unaware that patient's birthday is the following day.  Mother made plans to return this CSWs call in the morning.   Assunta Curtis, MSW, LCSW 01/24/2023 1:29 PM

## 2023-01-24 NOTE — Group Note (Signed)
[facility] LCSW Group Therapy Note   Group Date: 01/24/2023 Start Time: 1300 End Time: 1400   Type of Therapy/Topic:  Group Therapy:  Balance in Life  Participation Level:  Active   Description of Group:    This group will address the concept of balance and how it feels and looks when one is unbalanced. Patients will be encouraged to process areas in their lives that are out of balance, and identify reasons for remaining unbalanced. Facilitators will guide patients utilizing problem- solving interventions to address and correct the stressor making their life unbalanced. Understanding and applying boundaries will be explored and addressed for obtaining  and maintaining a balanced life. Patients will be encouraged to explore ways to assertively make their unbalanced needs known to significant others in their lives, using other group members and facilitator for support and feedback.  Therapeutic Goals: Patient will identify two or more emotions or situations they have that consume much of in their lives. Patient will identify signs/triggers that life has become out of balance:  Patient will identify two ways to set boundaries in order to achieve balance in their lives:  Patient will demonstrate ability to communicate their needs through discussion and/or role plays  Summary of Patient Progress: Patient was present for the entirety of the group process. He contributed "Mountains" during the [employer] and  Pharmacologist" during the larger discussion.    Therapeutic Modalities:   Cognitive Behavioral Therapy Solution-Focused Therapy Assertiveness Training   Shirl Harris, LCSW

## 2023-01-24 NOTE — Progress Notes (Signed)
Patient calm and pleasant during assessment. Pt denies SI/HI/AVH. Pt observed interacting appropriately with staff and peers on the unit. Pt compliant with medication administration per MD orders. Pt being monitored Q 15 minutes for safety per unit protocol, remains safe on the unit

## 2023-01-24 NOTE — Group Note (Signed)
Recreation Therapy Group Note   Group Topic:Healthy Decision Making  Group Date: 01/24/2023 Start Time: 1000 End Time: 1045 Facilitators: [name], LRT, CTRS Location:  Dayroom  Group Decision: [employer]. Patients and LRT tossed around a beach ball while listening to music. At random, LRT would stop the music and ask pt who was holding the ball at that time to answer a question: "Give me an example of a good decision, give me an example of a bad decision, what would a consequence of that decision be, are all consequences bad?" Pt's then filled out a Decision-Making Inventory to see what type of decision maker they are. LRT educated pts on different styles of decision making and wrote it on the board for all to see. LRT and pts shared their decision-making style with the group and processed on whether they thought it was an accurate representation of them or not.   Affect/Mood: Appropriate   Participation Level: Minimal   Participation Quality: Minimal Cues   Behavior: Calm   Speech/Thought Process: Incoherent   Insight: Limited   Judgement: Limited   Modes of Intervention: Activity, Education, and Worksheet   Patient Response to Interventions:  Engaged   Education Outcome:  In group clarification offered    Clinical Observations/Individualized Feedback: Janes was somewhat active in their participation of session activities and group discussion. Pt threw the ball around with peers and drew circles on his paper. LRT attempted to assist pt in adding up his inventory sheet, however pt said "no". Pt was appropriate and not a disturbance to peers.   Plan: Continue to engage patient in RT group sessions 2-3x/week.   [name], LRT, CTRS 01/24/2023 11:13 AM

## 2023-01-24 NOTE — Progress Notes (Signed)
Patient appropriate with staff & peers. No issues verbalized. Appetite and energy level good. Support and encouragement given.

## 2023-01-25 NOTE — BHH Counselor (Signed)
CSW spoke with the patient's mother regarding funding.    Mother reports that she spoke with Social Security and informed of the following:  "He received last year $13,008 from his father's social security, he also received $9,408 from the social security that he paid into".   Mother reports that she does not have the monthly amounts nor the back account amount.  She reports that seh will call this CSW on Monday 01/28/2023 with that information.    Mother reports that she will be sending a package for patient.  CSW provided the address instructions to mother.  CSW reviewed with mother acceptable items on the unit.    CSW also discussed with mother her plans to visit the patient.  CSW updated on the current visitation  hours.  Mother reports that "he has sundowners so me coming in the evening at 6:30 would not be good for him".  CSW explained to mother that there have been no reports of patient becoming agitated in the evening.  Mother reiterated that "6:30 is the evening dear".    CSW informed mother that she will follow up with leadership.   Assunta Curtis, MSW, LCSW 01/25/2023 4:17 PM

## 2023-01-25 NOTE — Progress Notes (Signed)
[facility] MD Progress Note  01/25/2023 11:16 AM Adam [name]  MRN:  [id number] Subjective: Patient with schizophrenia.  Behavior calm today.  It is his birthday and everyone is wishing him happy birthday.  No new symptoms. 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)    Schizophrenia ([place])    Stroke ([place])     Past Surgical History:  Procedure Laterality Date   NO PAST SURGERIES     Family History:  Family History  Problem Relation Age of Onset   Hypertension Mother    Family Psychiatric  History: See previous Social History:  Social History   Substance and Sexual Activity  Alcohol Use Not Currently     Social History   Substance and Sexual Activity  Drug Use Not Currently    Social History   Socioeconomic History   Marital status: Single    Spouse name: Not on file   Number of children: Not on file   Years of education: Not on file   Highest education level: Not on file  Occupational History   Not on file  Tobacco Use   Smoking status: Never    Passive exposure: Never   Smokeless tobacco: Never  Vaping Use   Vaping Use: Unknown  Substance and Sexual Activity   Alcohol use: Not Currently   Drug use: Not Currently   Sexual activity: Not Currently  Other Topics Concern   Not on file  Social History Narrative   Not on file   Social Determinants of Health    Financial Resource Strain: Not on file  Food Insecurity: Not on file  Transportation Needs: Not on file  Physical Activity: Not on file  Stress: Not on file  Social Connections: Not on file   Additional Social History:  Specify valuables returned:  (none)                      Sleep: Fair  Appetite:  Fair  Current Medications: Current Facility-Administered Medications  Medication Dose Route Frequency Provider Last Rate Last Admin   acetaminophen (TYLENOL) tablet 650 mg  650 mg Oral Q6H PRN Aspen Deterding T, MD   650 mg at 01/23/23 0844   alum & mag hydroxide-simeth (MAALOX/MYLANTA) 200-200-20 MG/5ML suspension 30 mL  30 mL Oral Q4H PRN Yosmar Ryker T, MD   30 mL at 04/26/22 [id number]   aspirin EC tablet 81 mg  81 mg Oral Daily Cyla Haluska T, MD   81 mg at 01/25/23 0900   atorvastatin (LIPITOR) tablet 20 mg  20 mg Oral Daily Davina Howlett T, MD   20 mg at 01/25/23 0900   atropine 1 % ophthalmic solution 1 drop  1 drop Sublingual QID [name], MD   1 drop at 01/25/23 0900   benztropine (COGENTIN) tablet 0.5 mg  0.5 mg Oral BID Donyae Kilner T, MD   0.5 mg at 01/25/23 0900   clonazePAM (KLONOPIN) tablet 1 mg  1 mg Oral TID PRN He, [name], MD   1 mg at 01/19/23 2121   cloZAPine (CLOZARIL) tablet 300 mg  300 mg Oral QHS Melicia Esqueda T, MD   300 mg at 01/24/23 2105   diphenhydrAMINE (BENADRYL) capsule 50 mg  50 mg Oral Q6H PRN Kaipo Ardis T, MD   50 mg at 01/23/23 1832   Or   diphenhydrAMINE (BENADRYL) injection 50 mg  50 mg Intramuscular Q6H PRN Zeke Aker T, MD   50 mg at 01/01/23 2252   feeding supplement (ENSURE ENLIVE / ENSURE PLUS) liquid 237 mL  237 mL Oral BID BM Jeananne Bedwell T, MD   237 mL at 01/25/23 0901   gabapentin (NEURONTIN) capsule 300 mg  300 mg Oral TID Denim Kalmbach T, MD   300 mg at 01/25/23 0900   haloperidol (HALDOL) tablet 2 mg  2 mg Oral TID Morrigan Wickens T, MD   2 mg at 01/25/23 0900   haloperidol (HALDOL) tablet 5 mg  5 mg Oral Q6H PRN Siboney Requejo,  Talene Glastetter T, MD   5 mg at 01/23/23 [id number]   Or   haloperidol lactate (HALDOL) injection 5 mg  5 mg Intramuscular Q6H PRN Devona Holmes T, MD   5 mg at 01/01/23 2252   lisinopril (ZESTRIL) tablet 5 mg  5 mg Oral Daily Tully Burgo T, MD   5 mg at 01/25/23 0900   magnesium hydroxide (MILK OF MAGNESIA) suspension 30 mL  30 mL Oral Daily PRN Valentino Saavedra T, MD   30 mL at 01/20/23 2133   metoprolol succinate (TOPROL-XL) 24 hr tablet 25 mg  25 mg Oral Daily Shambria Camerer T, MD   25 mg at 01/25/23 0900   ondansetron (ZOFRAN) tablet 4 mg  4 mg Oral Q8H PRN [name], NP   4 mg at 09/06/22 2320   paliperidone (INVEGA SUSTENNA) injection 156 mg  156 mg Intramuscular Q28 days Muaz Shorey T, MD   156 mg at 01/15/23 1652   paliperidone (INVEGA) 24 hr tablet 6 mg  6 mg Oral QHS Shannan Slinker T, MD   6 mg at 01/24/23 2105   senna-docusate (Senokot-S) tablet 2 tablet  2 tablet Oral QHS PRN [name], DO   2 tablet at 01/20/23 2133   temazepam (RESTORIL) capsule 15 mg  15 mg Oral QHS Akyra Bouchie T, MD   15 mg at 01/24/23 2105   ziprasidone (GEODON) injection 20 mg  20 mg Intramuscular Q12H PRN Anneka Studer, [name], MD   20 mg at 07/08/22 1453    Lab Results: No results found for this or any previous visit (from the past 48 hour(s)).  Blood Alcohol level:  Lab Results  Component Value Date   ETH <10 [id number]    Metabolic Disorder Labs: Lab Results  Component Value Date   HGBA1C 5.5 04/10/2022   MPG 111.15 04/10/2022   No results found for: "PROLACTIN" Lab Results  Component Value Date   CHOL 167 11/20/2021   TRIG 107 11/20/2021   HDL 26 (L) 11/20/2021   CHOLHDL 6.4 11/20/2021   VLDL 21 11/20/2021   LDLCALC 120 (H) 11/20/2021    Physical Findings: AIMS: Facial and Oral Movements Muscles of Facial Expression: None, normal Lips and Perioral Area: None, normal Jaw: None, normal Tongue: None, normal,Extremity Movements Upper (arms, wrists, hands, fingers): None, normal Lower  (legs, knees, ankles, toes): None, normal, Trunk Movements Neck, shoulders, hips: None, normal, Overall Severity Severity of abnormal movements (highest score from  questions above): None, normal Incapacitation due to abnormal movements: None, normal Patient's awareness of abnormal movements (rate only patient's report): No Awareness, Dental Status Current problems with teeth and/or dentures?: No Does patient usually wear dentures?: No  CIWA:    COWS:     Musculoskeletal: Strength & Muscle Tone: within normal limits Gait & Station: normal Patient leans: N/A  Psychiatric Specialty Exam:  Presentation  General Appearance:  Appropriate for Environment; Casual; Neat; Well Groomed  Eye Contact: Fair  Speech: Slow  Speech Volume: Decreased  Handedness: Left   Mood and Affect  Mood: Anxious  Affect: Congruent   Thought Process  Thought Processes: Goal Directed  Descriptions of Associations:Circumstantial  Orientation:Partial  Thought Content:Scattered  History of Schizophrenia/Schizoaffective disorder:Yes  Duration of Psychotic Symptoms:Greater than six months  Hallucinations:No data recorded Ideas of Reference:None  Suicidal Thoughts:No data recorded Homicidal Thoughts:No data recorded  Sensorium  Memory: Remote Good  Judgment: Fair  Insight: Fair   Community education officer  Concentration: Fair  Attention Span: Fair  Recall: [employer] of Knowledge: Fair  Language: Fair   Psychomotor Activity  Psychomotor Activity:No data recorded  Assets  Assets:No data recorded  Sleep  Sleep:No data recorded   Physical Exam: Physical Exam Vitals and nursing note reviewed.  Constitutional:      Appearance: Normal appearance.  HENT:     Head: Normocephalic and atraumatic.     Mouth/Throat:     Pharynx: Oropharynx is clear.  Eyes:     Pupils: Pupils are equal, round, and reactive to light.  Cardiovascular:     Rate and Rhythm: Normal  rate and regular rhythm.  Pulmonary:     Effort: Pulmonary effort is normal.     Breath sounds: Normal breath sounds.  Abdominal:     General: Abdomen is flat.     Palpations: Abdomen is soft.  Musculoskeletal:        General: Normal range of motion.  Skin:    General: Skin is warm and dry.  Neurological:     General: No focal deficit present.     Mental Status: He is alert. Mental status is at baseline.  Psychiatric:        Attention and Perception: Attention normal.        Mood and Affect: Mood normal. Affect is blunt.        Speech: He is noncommunicative.    Review of Systems  Unable to perform ROS: Language   Blood pressure 106/68, pulse 96, temperature 98.2 F (36.8 C), temperature source Oral, resp. rate 18, height '5\' 8"'$  (1.727 m), weight 85.8 kg, SpO2 98 %. Body mass index is 28.76 kg/m.   Treatment Plan Summary: Plan absolutely stable no change to presentation.  No change to medication.  Continue to focus on discharge planning  Alethia Berthold, MD 01/25/2023, 11:16 AM

## 2023-01-25 NOTE — Group Note (Signed)
Recreation Therapy Group Note   Group Topic:Leisure Education  Group Date: 01/25/2023 Start Time: 1000 End Time: 1045 Facilitators: [name], LRT, CTRS Location:  Craft Room  Group Description: Leisure. Patients were given the option to choose from coloring mandalas, playing apples to apples, or scattergories duration of session while listening to music. LRT and pts discussed the importance of participating in leisure during their free time and when they're outside of the hospital. Pt identified two leisure interests and shared with the group.   Affect/Mood: N/A   Participation Level: Did not attend    Clinical Observations/Individualized Feedback: Kellee walked into the craft room and sat for 2 minutes before walking out of the room. Pt did not return until the end of group when peers were leaving. Pt was pleasant when seen.  Plan: Continue to engage patient in RT group sessions 2-3x/week.   [name], LRT, CTRS 01/25/2023 11:03 AM

## 2023-01-25 NOTE — Plan of Care (Signed)
D- Patient alert and oriented to person, place, and situation. Patient presented in a pleasant mood on assessment because today is his Rudene Anda!!! Patient denied SI, HI, AVH, and pain at this time. Patient also denied any signs/symptoms of depression and anxiety by shaking his head and saying "no". Patient had no stated goals for today. Patient has been ecstatic and smiling all day long.  A- Scheduled medications administered to patient, per MD orders. Support and encouragement provided.  Routine safety checks conducted every 15 minutes.  Patient informed to notify staff with problems or concerns.  R- No adverse drug reactions noted. Patient contracts for safety at this time. Patient compliant with medications and treatment plan. Patient receptive, calm, and cooperative. Patient interacts well with others on the unit. Patient remains safe at this time.  Problem: Education: Goal: Ability to state activities that reduce stress will improve Outcome: Progressing   Problem: Coping: Goal: Ability to identify and develop effective coping behavior will improve Outcome: Progressing   Problem: Self-Concept: Goal: Ability to identify factors that promote anxiety will improve Outcome: Progressing Goal: Level of anxiety will decrease Outcome: Progressing Goal: Ability to modify response to factors that promote anxiety will improve Outcome: Progressing   Problem: Education: Goal: Knowledge of General Education information will improve Description: Including pain rating scale, medication(s)/side effects and non-pharmacologic comfort measures Outcome: Progressing   Problem: Health Behavior/Discharge Planning: Goal: Ability to manage health-related needs will improve Outcome: Progressing   Problem: Clinical Measurements: Goal: Ability to maintain clinical measurements within normal limits will improve Outcome: Progressing Goal: Will remain free from infection Outcome: Progressing Goal: Diagnostic  test results will improve Outcome: Progressing Goal: Respiratory complications will improve Outcome: Progressing Goal: Cardiovascular complication will be avoided Outcome: Progressing   Problem: Activity: Goal: Risk for activity intolerance will decrease Outcome: Progressing   Problem: Nutrition: Goal: Adequate nutrition will be maintained Outcome: Progressing   Problem: Coping: Goal: Level of anxiety will decrease Outcome: Progressing   Problem: Elimination: Goal: Will not experience complications related to bowel motility Outcome: Progressing Goal: Will not experience complications related to urinary retention Outcome: Progressing   Problem: Pain Managment: Goal: General experience of comfort will improve Outcome: Progressing   Problem: Safety: Goal: Ability to remain free from injury will improve Outcome: Progressing   Problem: Skin Integrity: Goal: Risk for impaired skin integrity will decrease Outcome: Progressing   Problem: Activity: Goal: Interest or engagement in leisure activities will improve Outcome: Progressing   Problem: Coping: Goal: Coping ability will improve Outcome: Progressing   Problem: Education: Goal: Will be free of psychotic symptoms Outcome: Progressing

## 2023-01-25 NOTE — [facility] Group Notes (Signed)
[place] Group Notes:  (Nursing/MHT/Case Management/Adjunct)  Date:  01/25/2023  Time:  9:13 AM  Type of Therapy:  Psychoeducational Skills  Participation Level:  Active  Participation Quality:  Appropriate  Affect:  Appropriate  Cognitive:  Appropriate  Insight:  Appropriate  Engagement in Group:  Engaged  Modes of Intervention:  Activity  Summary of Progress/Problems:  Adela Lank [facility] 01/25/2023, 9:13 AM

## 2023-01-25 NOTE — Group Note (Signed)
[place] LCSW Group Therapy Note   Group Date: 01/25/2023 Start Time: 1300 End Time: 1400  Type of Therapy and Topic:  Group Therapy:  Feelings around Relapse and Recovery  Participation Level:  Did Not Attend   Mood:  Description of Group:    Patients in this group will discuss emotions they experience before and after a relapse. They will process how experiencing these feelings, or avoidance of experiencing them, relates to having a relapse. Facilitator will guide patients to explore emotions they have related to recovery. Patients will be encouraged to process which emotions are more powerful. They will be guided to discuss the emotional reaction significant others in their lives may have to patients' relapse or recovery. Patients will be assisted in exploring ways to respond to the emotions of others without this contributing to a relapse.  Therapeutic Goals: Patient will identify two or more emotions that lead to relapse for them:  Patient will identify two emotions that result when they relapse:  Patient will identify two emotions related to recovery:  Patient will demonstrate ability to communicate their needs through discussion and/or role plays.   Summary of Patient Progress:  Group not held due to complex discharge planning.   Therapeutic Modalities:   Cognitive Behavioral Therapy Solution-Focused Therapy Assertiveness Training Relapse Prevention Therapy   Rozann Lesches, LCSW

## 2023-01-26 NOTE — Progress Notes (Signed)
Patient is in the hallway, hollering down the hallway "robbery, yeah robbery", and has not stopped. This writer went to patient to explain that he needs to stop yelling out, disrupting the other patients. Patient was given PRN Klonopin, for agitation/anxiety. Patient tolerated medication well, without any issues.   01/26/23 1551  Complaints & Interventions  Complains of Agitation;Anxiety  Interventions Medication (see MAR) (Klonopin)  Neuro symptoms relieved by Anti-anxiety medication

## 2023-01-26 NOTE — Plan of Care (Signed)
D- Patient alert and oriented to person, place, and situation. Patient presents in a pleasant mood on assessment stating that he slept good, but was complaining of left leg pain. Patient rated his pain a "6/10", in which he requested PRN pain medication to help aid in relief. Patient denies SI, HI, AVH at this time, by smiling and shaking his head "no". Patient also denies any signs/symptoms of depression and anxiety by shaking his head and saying "no", to this Probation officer. Patient had no stated goals for today.   A- Scheduled medications administered to patient, per MD orders. Support and encouragement provided.  Routine safety checks conducted every 15 minutes.  Patient informed to notify staff with problems or concerns.  R- No adverse drug reactions noted. Patient contracts for safety at this time. Patient compliant with medications. Patient receptive, calm, and cooperative. Patient remains safe at this time.  Problem: Education: Goal: Ability to state activities that reduce stress will improve Outcome: Progressing   Problem: Coping: Goal: Ability to identify and develop effective coping behavior will improve Outcome: Progressing   Problem: Self-Concept: Goal: Ability to identify factors that promote anxiety will improve Outcome: Progressing Goal: Level of anxiety will decrease Outcome: Progressing Goal: Ability to modify response to factors that promote anxiety will improve Outcome: Progressing   Problem: Education: Goal: Knowledge of General Education information will improve Description: Including pain rating scale, medication(s)/side effects and non-pharmacologic comfort measures Outcome: Progressing   Problem: Health Behavior/Discharge Planning: Goal: Ability to manage health-related needs will improve Outcome: Progressing   Problem: Clinical Measurements: Goal: Ability to maintain clinical measurements within normal limits will improve Outcome: Progressing Goal: Will remain free  from infection Outcome: Progressing Goal: Diagnostic test results will improve Outcome: Progressing Goal: Respiratory complications will improve Outcome: Progressing Goal: Cardiovascular complication will be avoided Outcome: Progressing   Problem: Activity: Goal: Risk for activity intolerance will decrease Outcome: Progressing   Problem: Nutrition: Goal: Adequate nutrition will be maintained Outcome: Progressing   Problem: Coping: Goal: Level of anxiety will decrease Outcome: Progressing   Problem: Elimination: Goal: Will not experience complications related to bowel motility Outcome: Progressing Goal: Will not experience complications related to urinary retention Outcome: Progressing   Problem: Pain Managment: Goal: General experience of comfort will improve Outcome: Progressing   Problem: Safety: Goal: Ability to remain free from injury will improve Outcome: Progressing   Problem: Skin Integrity: Goal: Risk for impaired skin integrity will decrease Outcome: Progressing   Problem: Activity: Goal: Interest or engagement in leisure activities will improve Outcome: Progressing   Problem: Coping: Goal: Coping ability will improve Outcome: Progressing   Problem: Education: Goal: Will be free of psychotic symptoms Outcome: Progressing

## 2023-01-26 NOTE — Progress Notes (Signed)
Patient is alert and oriented x 4, affect is flat but brighten upon approach and interacting appropriately with peers and staff. Patient denies SI/HI/AVH, he was offered emotional support. Patient was complaint with medication regimen no distress noted will continue to monitor.

## 2023-01-26 NOTE — Progress Notes (Signed)
[facility] MD Progress Note  01/26/2023 12:59 PM Adam [name]  MRN:  [id number] Subjective: Adam [name] is seen on rounds.  He has been in good controls.  Nurses report no issues.  He has no complaints.  No side effects from his medicine. 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[place])   Stroke (HCC)   HTN (hypertension)   HLD (hyperlipidemia)   CAD (coronary artery disease)   Chronic systolic CHF (congestive heart failure) (HCC)   Anxiety   Infection of left earlobe  Total Time spent with patient: 15 minutes  Past Psychiatric History: Schizophrenia and CVA  Past Medical History:  Past Medical History:  Diagnosis Date   Myocardial infarction ([place])    Schizophrenia ([place])    Stroke ([place])     Past Surgical History:  Procedure Laterality Date   NO PAST SURGERIES     Family History:  Family History  Problem Relation Age of Onset   Hypertension Mother    Family Psychiatric  History: Unremarkable Social History:  Social History   Substance and Sexual Activity  Alcohol Use Not Currently     Social History   Substance and Sexual Activity  Drug Use Not Currently    Social History   Socioeconomic History   Marital status: Single    Spouse name: Not on file   Number of children: Not on file   Years of education: Not on file   Highest education level: Not on file  Occupational History   Not on file  Tobacco Use   Smoking status: Never    Passive exposure: Never   Smokeless tobacco: Never  Vaping Use   Vaping Use: Unknown  Substance and Sexual Activity   Alcohol use: Not Currently   Drug use: Not Currently   Sexual activity: Not Currently  Other Topics Concern   Not on file  Social History Narrative   Not on file   Social Determinants of Health    Financial Resource Strain: Not on file  Food Insecurity: Not on file  Transportation Needs: Not on file  Physical Activity: Not on file  Stress: Not on file  Social Connections: Not on file   Additional Social History:  Specify valuables returned:  (none)                      Sleep: Good  Appetite:  Good  Current Medications: Current Facility-Administered Medications  Medication Dose Route Frequency Provider Last Rate Last Admin   acetaminophen (TYLENOL) tablet 650 mg  650 mg Oral Q6H PRN [name], [name], MD   650 mg at 01/26/23 0821   alum & mag hydroxide-simeth (MAALOX/MYLANTA) 200-200-20 MG/5ML suspension 30 mL  30 mL Oral Q4H PRN [name], [name], MD   30 mL at 04/26/22 [id number]   aspirin EC tablet 81 mg  81 mg Oral Daily [name], [name], MD   81 mg at 01/26/23 [id number]   atorvastatin (LIPITOR) tablet 20 mg  20 mg Oral Daily [name], [name], MD   20 mg at 01/26/23 [id number]   atropine 1 % ophthalmic solution 1 drop  1 drop Sublingual QID [name], MD   1 drop at 01/26/23 1228   benztropine (COGENTIN) tablet 0.5 mg  0.5 mg Oral BID [name], [name], MD   0.5 mg at 01/26/23 0821   clonazePAM (KLONOPIN)  tablet 1 mg  1 mg Oral TID PRN He, [name], MD   1 mg at 01/25/23 2118   cloZAPine (CLOZARIL) tablet 300 mg  300 mg Oral QHS [name], [name], MD   300 mg at 01/25/23 2118   diphenhydrAMINE (BENADRYL) capsule 50 mg  50 mg Oral Q6H PRN [name], [name], MD   50 mg at 01/23/23 1832   Or   diphenhydrAMINE (BENADRYL) injection 50 mg  50 mg Intramuscular Q6H PRN [name], [name], MD   50 mg at 01/01/23 2252   feeding supplement (ENSURE ENLIVE / ENSURE PLUS) liquid 237 mL  237 mL Oral BID BM [name], [name], MD   237 mL at 01/26/23 0900   gabapentin (NEURONTIN) capsule 300 mg  300 mg Oral TID [name], [name], MD   300 mg at 01/26/23 0900   haloperidol (HALDOL) tablet 2 mg  2 mg Oral TID [name], [name], MD   2 mg at 01/26/23 0900   haloperidol (HALDOL) tablet 5 mg  5 mg Oral Q6H PRN [name],  [name], MD   5 mg at 01/23/23 1832   Or   haloperidol lactate (HALDOL) injection 5 mg  5 mg Intramuscular Q6H PRN [name], [name], MD   5 mg at 01/01/23 2252   lisinopril (ZESTRIL) tablet 5 mg  5 mg Oral Daily [name], [name], MD   5 mg at 01/26/23 [id number]   magnesium hydroxide (MILK OF MAGNESIA) suspension 30 mL  30 mL Oral Daily PRN [name], [name], MD   30 mL at 01/20/23 2133   metoprolol succinate (TOPROL-XL) 24 hr tablet 25 mg  25 mg Oral Daily [name], [name], MD   25 mg at 01/26/23 0821   ondansetron (ZOFRAN) tablet 4 mg  4 mg Oral Q8H PRN [name], NP   4 mg at 09/06/22 2320   paliperidone (INVEGA SUSTENNA) injection 156 mg  156 mg Intramuscular Q28 days [name], [name], MD   156 mg at 01/15/23 1652   paliperidone (INVEGA) 24 hr tablet 6 mg  6 mg Oral QHS [name], [name], MD   6 mg at 01/25/23 2118   senna-docusate (Senokot-S) tablet 2 tablet  2 tablet Oral QHS PRN [name], DO   2 tablet at 01/20/23 2133   temazepam (RESTORIL) capsule 15 mg  15 mg Oral QHS [name], [name], MD   15 mg at 01/25/23 2118   ziprasidone (GEODON) injection 20 mg  20 mg Intramuscular Q12H PRN [name], [name], MD   20 mg at 07/08/22 1453    Lab Results: No results found for this or any previous visit (from the past 48 hour(s)).  Blood Alcohol level:  Lab Results  Component Value Date   ETH <10 [id number]    Metabolic Disorder Labs: Lab Results  Component Value Date   HGBA1C 5.5 04/10/2022   MPG 111.15 04/10/2022   No results found for: "PROLACTIN" Lab Results  Component Value Date   CHOL 167 11/20/2021   TRIG 107 11/20/2021   HDL 26 (L) 11/20/2021   CHOLHDL 6.4 11/20/2021   VLDL 21 11/20/2021   LDLCALC 120 (H) 11/20/2021    Physical Findings: AIMS: Facial and Oral Movements Muscles of Facial Expression: None, normal Lips and Perioral Area: None, normal Jaw: None, normal Tongue: None, normal,Extremity Movements Upper (arms, wrists, hands, fingers): None, normal Lower  (legs, knees, ankles, toes): None, normal, Trunk Movements Neck, shoulders, hips: None, normal, Overall Severity Severity of abnormal  movements (highest score from questions above): None, normal Incapacitation due to abnormal movements: None, normal Patient's awareness of abnormal movements (rate only patient's report): No Awareness, Dental Status Current problems with teeth and/or dentures?: No Does patient usually wear dentures?: No  CIWA:    COWS:     Musculoskeletal: Strength & Muscle Tone: within normal limits Gait & Station: normal Patient leans: N/A  Psychiatric Specialty Exam:  Presentation  General Appearance:  Appropriate for Environment; Casual; Neat; Well Groomed  Eye Contact: Fair  Speech: Slow  Speech Volume: Decreased  Handedness: Left   Mood and Affect  Mood: Anxious  Affect: Congruent   Thought Process  Thought Processes: Goal Directed  Descriptions of Associations:Circumstantial  Orientation:Partial  Thought Content:Scattered  History of Schizophrenia/Schizoaffective disorder:Yes  Duration of Psychotic Symptoms:Greater than six months  Hallucinations:No data recorded Ideas of Reference:None  Suicidal Thoughts:No data recorded Homicidal Thoughts:No data recorded  Sensorium  Memory: Remote Good  Judgment: Fair  Insight: Fair   Materials engineer: Fair  Attention Span: Fair  Recall: [place] of Knowledge: Fair  Language: Fair   Psychomotor Activity  Psychomotor Activity:No data recorded  Assets  Assets:No data recorded  Sleep  Sleep:No data recorded    Blood pressure 120/84, pulse 94, temperature 98.2 F (36.8 C), temperature source Oral, resp. rate 18, height '5\' 8"'$  (1.727 m), weight 85.8 kg, SpO2 100 %. Body mass index is 28.76 kg/m.   Treatment Plan Summary: Daily contact with patient to assess and evaluate symptoms and progress in treatment, Medication management, and Plan  continue current medications.  [name], DO 01/26/2023, 12:59 PM

## 2023-01-27 NOTE — Progress Notes (Signed)
[facility] MD Progress Note  01/27/2023 1:06 PM Adam [name]  MRN:  [id number] Subjective: No changes with Adam [name].  He has been compliant with medications.  No side effects.  Nurses report no issues. 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[place])   Stroke (HCC)   HTN (hypertension)   HLD (hyperlipidemia)   CAD (coronary artery disease)   Chronic systolic CHF (congestive heart failure) (HCC)   Anxiety   Infection of left earlobe  Total Time spent with patient: 15 minutes  Past Psychiatric History: CVA and schizophrenia  Past Medical History:  Past Medical History:  Diagnosis Date   Myocardial infarction ([place])    Schizophrenia ([place])    Stroke ([place])     Past Surgical History:  Procedure Laterality Date   NO PAST SURGERIES     Family History:  Family History  Problem Relation Age of Onset   Hypertension Mother    Family Psychiatric  History: Unremarkable Social History:  Social History   Substance and Sexual Activity  Alcohol Use Not Currently     Social History   Substance and Sexual Activity  Drug Use Not Currently    Social History   Socioeconomic History   Marital status: Single    Spouse name: Not on file   Number of children: Not on file   Years of education: Not on file   Highest education level: Not on file  Occupational History   Not on file  Tobacco Use   Smoking status: Never    Passive exposure: Never   Smokeless tobacco: Never  Vaping Use   Vaping Use: Unknown  Substance and Sexual Activity   Alcohol use: Not Currently   Drug use: Not Currently   Sexual activity: Not Currently  Other Topics Concern   Not on file  Social History Narrative   Not on file   Social Determinants of Health   Financial Resource Strain: Not on file   Food Insecurity: Not on file  Transportation Needs: Not on file  Physical Activity: Not on file  Stress: Not on file  Social Connections: Not on file   Additional Social History:  Specify valuables returned:  (none)                      Sleep: Good  Appetite:  Good  Current Medications: Current Facility-Administered Medications  Medication Dose Route Frequency Provider Last Rate Last Admin   acetaminophen (TYLENOL) tablet 650 mg  650 mg Oral Q6H PRN [name], [name], MD   650 mg at 01/27/23 1123   alum & mag hydroxide-simeth (MAALOX/MYLANTA) 200-200-20 MG/5ML suspension 30 mL  30 mL Oral Q4H PRN [name], [name], MD   30 mL at 04/26/22 [id number]   aspirin EC tablet 81 mg  81 mg Oral Daily [name], [name], MD   81 mg at 01/27/23 0825   atorvastatin (LIPITOR) tablet 20 mg  20 mg Oral Daily [name], [name], MD   20 mg at 01/27/23 0826   atropine 1 % ophthalmic solution 1 drop  1 drop Sublingual QID [name], MD   1 drop at 01/27/23 1123   benztropine (COGENTIN) tablet 0.5 mg  0.5 mg Oral BID [name], [name], MD   0.5 mg at 01/27/23 0826   clonazePAM (KLONOPIN) tablet 1 mg  1 mg Oral TID PRN  He, [name], MD   1 mg at 01/26/23 1551   cloZAPine (CLOZARIL) tablet 300 mg  300 mg Oral QHS [name], [name], MD   300 mg at 01/26/23 2242   diphenhydrAMINE (BENADRYL) capsule 50 mg  50 mg Oral Q6H PRN [name], [name], MD   50 mg at 01/23/23 1832   Or   diphenhydrAMINE (BENADRYL) injection 50 mg  50 mg Intramuscular Q6H PRN [name], [name], MD   50 mg at 01/01/23 2252   feeding supplement (ENSURE ENLIVE / ENSURE PLUS) liquid 237 mL  237 mL Oral BID BM [name], [name], MD   237 mL at 01/27/23 1025   gabapentin (NEURONTIN) capsule 300 mg  300 mg Oral TID [name], [name], MD   300 mg at 01/27/23 1024   haloperidol (HALDOL) tablet 2 mg  2 mg Oral TID [name], [name], MD   2 mg at 01/27/23 1025   haloperidol (HALDOL) tablet 5 mg  5 mg Oral Q6H PRN [name], [name], MD   5 mg at 01/23/23 1832   Or    haloperidol lactate (HALDOL) injection 5 mg  5 mg Intramuscular Q6H PRN [name], [name], MD   5 mg at 01/01/23 2252   lisinopril (ZESTRIL) tablet 5 mg  5 mg Oral Daily [name], [name], MD   5 mg at 01/27/23 [id number]   magnesium hydroxide (MILK OF MAGNESIA) suspension 30 mL  30 mL Oral Daily PRN [name], [name], MD   30 mL at 01/20/23 2133   metoprolol succinate (TOPROL-XL) 24 hr tablet 25 mg  25 mg Oral Daily [name], [name], MD   25 mg at 01/27/23 0828   ondansetron (ZOFRAN) tablet 4 mg  4 mg Oral Q8H PRN [name], NP   4 mg at 09/06/22 2320   paliperidone (INVEGA SUSTENNA) injection 156 mg  156 mg Intramuscular Q28 days [name], [name], MD   156 mg at 01/15/23 1652   paliperidone (INVEGA) 24 hr tablet 6 mg  6 mg Oral QHS [name], [name], MD   6 mg at 01/26/23 2242   senna-docusate (Senokot-S) tablet 2 tablet  2 tablet Oral QHS PRN [name], DO   2 tablet at 01/20/23 2133   temazepam (RESTORIL) capsule 15 mg  15 mg Oral QHS [name], [name], MD   15 mg at 01/26/23 2242   ziprasidone (GEODON) injection 20 mg  20 mg Intramuscular Q12H PRN [name], [name], MD   20 mg at 07/08/22 1453    Lab Results: No results found for this or any previous visit (from the past 6 hour(s)).  Blood Alcohol level:  Lab Results  Component Value Date   ETH <10 [id number]    Metabolic Disorder Labs: Lab Results  Component Value Date   HGBA1C 5.5 04/10/2022   MPG 111.15 04/10/2022   No results found for: "PROLACTIN" Lab Results  Component Value Date   CHOL 167 11/20/2021   TRIG 107 11/20/2021   HDL 26 (L) 11/20/2021   CHOLHDL 6.4 11/20/2021   VLDL 21 11/20/2021   LDLCALC 120 (H) 11/20/2021    Physical Findings: AIMS: Facial and Oral Movements Muscles of Facial Expression: None, normal Lips and Perioral Area: None, normal Jaw: None, normal Tongue: None, normal,Extremity Movements Upper (arms, wrists, hands, fingers): None, normal Lower (legs, knees, ankles, toes): None, normal,  Trunk Movements Neck, shoulders, hips: None, normal, Overall Severity Severity of abnormal movements (highest score from questions above): None, normal Incapacitation  due to abnormal movements: None, normal Patient's awareness of abnormal movements (rate only patient's report): No Awareness, Dental Status Current problems with teeth and/or dentures?: No Does patient usually wear dentures?: No  CIWA:    COWS:     Musculoskeletal: Strength & Muscle Tone: within normal limits Gait & Station: normal Patient leans: N/A  Psychiatric Specialty Exam:  Presentation  General Appearance:  Appropriate for Environment; Casual; Neat; Well Groomed  Eye Contact: Fair  Speech: Slow  Speech Volume: Decreased  Handedness: Left   Mood and Affect  Mood: Anxious  Affect: Congruent   Thought Process  Thought Processes: Goal Directed  Descriptions of Associations:Circumstantial  Orientation:Partial  Thought Content:Scattered  History of Schizophrenia/Schizoaffective disorder:Yes  Duration of Psychotic Symptoms:Greater than six months  Hallucinations:No data recorded Ideas of Reference:None  Suicidal Thoughts:No data recorded Homicidal Thoughts:No data recorded  Sensorium  Memory: Remote Good  Judgment: Fair  Insight: Fair   Materials engineer: Fair  Attention Span: Fair  Recall: [place] of Knowledge: Fair  Language: Fair   Psychomotor Activity  Psychomotor Activity:No data recorded  Assets  Assets:No data recorded  Sleep  Sleep:No data recorded    Blood pressure 101/68, pulse 95, temperature 98.2 F (36.8 C), temperature source Oral, resp. rate 18, height '5\' 8"'$  (1.727 m), weight 85.8 kg, SpO2 100 %. Body mass index is 28.76 kg/m.   Treatment Plan Summary: Daily contact with patient to assess and evaluate symptoms and progress in treatment, Medication management, and Plan continue current medications.  [name], DO 01/27/2023, 1:06 PM

## 2023-01-27 NOTE — Progress Notes (Signed)
Patient is alert and oriented x 4, affect is flat but brighten upon approach and interacting appropriately with peers and staff. Patient denies SI/HI/AVH, he was offered emotional support. Patient was complaint with medication regimen no distress noted will continue to monitor.

## 2023-01-27 NOTE — Plan of Care (Signed)
Patient denies SI/HI/AVH. Pt is agitated at times, shouting and yelling in milieu. Pt is out in milieu watching TV. Has little interactions with peers.    Problem: Education: Goal: Ability to state activities that reduce stress will improve Outcome: Not Progressing   Problem: Coping: Goal: Ability to identify and develop effective coping behavior will improve Outcome: Not Progressing

## 2023-01-27 NOTE — B[facility] Group Notes (Signed)
[facility] Group Notes:  (Nursing/MHT/Case Management/Adjunct)  Date:  01/27/2023  Time:  10:08 PM  Type of Therapy:  Group Therapy  Participation Level:  Minimal  Participation Quality:  Intrusive  Affect:  Angry, Defensive, and Irritable  Cognitive:  Disorganized  Insight:  Limited  Engagement in Group:  Defensive  Modes of Intervention:  Confrontation  Summary of Progress/Problems:  [name],Demoni Parmar E 01/27/2023, 10:08 PM

## 2023-01-27 NOTE — [facility] Group Notes (Signed)
[facility] Group Notes:  (Nursing/MHT/Case Management/Adjunct)  Date:  01/27/2023  Time:  10:33 AM  Type of Therapy:   community meeting  Participation Level:  Did Not Attend    Antonieta Pert 01/27/2023, 10:33 AM

## 2023-01-28 NOTE — Progress Notes (Signed)
Patient irritable most of shift until bedtime. Walking halls yelling and screaming. Demanding and intrusive to staff. Not easy to re-direct. Prn given to help calm. Minimal interaction with peers. Continued to circle nurses station and knock on doors. Encouragement and support provided. Safety checks maintained. Medications given as prescribed. Pt receptive and remains safe on unit with q 15 min checks.

## 2023-01-28 NOTE — Progress Notes (Signed)
PRN medication given for agitation/anxiety. Patient has been yelling every since the start of the shift. Patient tolerated medication administration well, without any issues.   01/28/23 0815  Pain Assessment  Pain Scale 0-10  Pain Score 4  Faces Pain Scale 2  Pain Type Acute pain  Pain Location Leg  Pain Orientation Right;Lower  Pain Descriptors / Indicators Aching;Discomfort  Pain Frequency Intermittent  Pain Onset Sudden  Pain Intervention(s) Medication (See eMAR)  Multiple Pain Sites No  Complaints & Interventions  Complains of Agitation;Anxiety;Restless  Interventions Medication (see MAR)  Neuro symptoms relieved by Anti-anxiety medication

## 2023-01-28 NOTE — Progress Notes (Signed)
Patient just walked around and said "I'm sorry", to all staff and the other members on the unit, for his behavior this morning.

## 2023-01-28 NOTE — B[facility] Counselor (Signed)
CSW called [place] to make report of possible need for a guardian.   CSW expressed concerns for patient being able to care for himself, care for his ADL's, take his medications, etc.  Assunta Curtis, MSW, LCSW 01/28/2023 1:41 PM

## 2023-01-28 NOTE — Progress Notes (Signed)
D: Patient denies SI/HI/AVH. Patient is angry  and uncooperative with treatment plans, he was agitated, angry with peers in the dayroom, at one point was physically aggressive with another patient. Staff redirected him for safety on the unit, he was noted anxious and restless and not interacting appropriately with peers and staff.  A: Patient was offered support and encouragement, he was was given scheduled medications, and was encouraged to attend groups. 15 minutes safety checks were done for safety.  R: Patients attended group, he was angry and inappropriate he did not interacts well with peers and staff. Patient is complaint with medication regimen.15 minutes safety checks maintained will continue to monitor.

## 2023-01-28 NOTE — Progress Notes (Signed)
Patient vitals were reported to this writer during shift report, being that they did not transfer over to the chart. This Probation officer input patient vitals as shown.   01/28/23 0630  Vital Signs  Temp 98.9 F (37.2 C)  Temp Source Oral  Pulse Rate 83  Pulse Rate Source Monitor  Resp 19  BP (!) 143/96  BP Location Right Arm  BP Method Automatic  Patient Position (if appropriate) Sitting  Oxygen Therapy  SpO2 100 %  O2 Device Room Air

## 2023-01-28 NOTE — Plan of Care (Signed)
  Problem: Education: Goal: Ability to state activities that reduce stress will improve Outcome: Not Progressing   Problem: Self-Concept: Goal: Level of anxiety will decrease Outcome: Not Progressing   Problem: Education: Goal: Knowledge of General Education information will improve Description: Including pain rating scale, medication(s)/side effects and non-pharmacologic comfort measures Outcome: Progressing   Problem: Coping: Goal: Level of anxiety will decrease Outcome: Not Progressing   Problem: Safety: Goal: Ability to remain free from injury will improve Outcome: Progressing

## 2023-01-28 NOTE — BHH Counselor (Signed)
CSW spoke with the patient's mother.    Mother reports that in 2023 patient received $784/month from his own Social Security. He received $1,084 from the stipend set up by his father.    She reports that patient does not receive any disability.  She reports that in 2024 pat receives $809, from his own Social Security and $1,118 from the stipend from his father.  Rates are per month.  She reports that she is still in the process of making funeral arrangements from patient and "he should have about $21,000 in the bank and about $6,000 will be used for his funeral".  She reports that she will be looking at burial sites in the next week.  She reports that she does NOT have arrangements for pt in case of her death.  She reports that she made some in her will, however, it will need to be revised and changed.    She reports that patient has an older brother, Shanon Brow, who "has no feelings of guilt" in regards to the patient. CSW asked for clarification of statement and she reports that the patient's brother has asked to not be involved in patient's care.  She alleges that older brother was not watching the patient when he supposedly had a head injury at the age of 47.  She reports that the patient's brother believes that the patient's drug use and "time in [place] with his father" led to the patient's current state.  She reports that patient lived with his father, a professor at [employer], where patient later attended and graduated after 6 years, in [place].  Mother reports that patient was an established musician at one time.    She reports that she hopes to visit patient in the coming weeks, stating that she will have a medical procedure soon, "a watchmen implanted so blood clots don't come out" and so that she may come off her blood thinner.  She reports that she will touch base after speaking with her transportation on a day/time (likely 11ish) when she will be by to visit the patient.  Assunta Curtis, MSW, LCSW 01/28/2023 4:07 PM

## 2023-01-28 NOTE — Group Note (Signed)
Recreation Therapy Group Note   Group Topic:Problem Solving  Group Date: 01/28/2023 Start Time: 1000 End Time: 1100 Facilitators: Leona Carry, CTRS Location:  Craft Room  Group Description: Life Boat. Patients were given the scenario that they are on a boat that is about to become shipwrecked, leaving them stranded on an [place]. They are asked to make a list of 15 different items that they want to take with them when they are stranded on the [place]. Patients are asked to rank their items from most important to least important, #1 being the most important and #15 being the least. Patients will work individually for the first round to come up with 15 items and then pair up with a peer(s) to condense their list and come up with one list of 15 items between the two of them. Patients or LRT will read aloud the 15 different items to the group after each round. LRT facilitated post-activity processing to discuss how this activity can be used in daily life post discharge.   Affect/Mood: N/A   Participation Level: Non-verbal    Clinical Observations/Individualized Feedback: Jaceion was in and out of group multiple times. Intermittently yelling loudly and disturbing peers.   Plan: Continue to engage patient in RT group sessions 2-3x/week.   [name], LRT, CTRS 01/28/2023 11:20 AM

## 2023-01-28 NOTE — Progress Notes (Signed)
[facility] MD Progress Note  01/28/2023 4:15 PM Adam [name]  MRN:  [id number] Subjective: No change to presentation.  Behavior stable. 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[place])   Stroke (HCC)   HTN (hypertension)   HLD (hyperlipidemia)   CAD (coronary artery disease)   Chronic systolic CHF (congestive heart failure) (HCC)   Anxiety   Infection of left earlobe  Total Time spent with patient: 30 minutes  Past Psychiatric History: Past history of schizophrenia  Past Medical History:  Past Medical History:  Diagnosis Date   Myocardial infarction ([place])    Schizophrenia ([place])    Stroke ([place])     Past Surgical History:  Procedure Laterality Date   NO PAST SURGERIES     Family History:  Family History  Problem Relation Age of Onset   Hypertension Mother    Family Psychiatric  History: See previous Social History:  Social History   Substance and Sexual Activity  Alcohol Use Not Currently     Social History   Substance and Sexual Activity  Drug Use Not Currently    Social History   Socioeconomic History   Marital status: Single    Spouse name: Not on file   Number of children: Not on file   Years of education: Not on file   Highest education level: Not on file  Occupational History   Not on file  Tobacco Use   Smoking status: Never    Passive exposure: Never   Smokeless tobacco: Never  Vaping Use   Vaping Use: Unknown  Substance and Sexual Activity   Alcohol use: Not Currently   Drug use: Not Currently   Sexual activity: Not Currently  Other Topics Concern   Not on file  Social History Narrative   Not on file   Social Determinants of Health   Financial Resource Strain: Not on file  Food Insecurity: Not on file  Transportation Needs:  Not on file  Physical Activity: Not on file  Stress: Not on file  Social Connections: Not on file   Additional Social History:  Specify valuables returned:  (none)                      Sleep: Fair  Appetite:  Fair  Current Medications: Current Facility-Administered Medications  Medication Dose Route Frequency Provider Last Rate Last Admin   acetaminophen (TYLENOL) tablet 650 mg  650 mg Oral Q6H PRN Darran Gabay T, MD   650 mg at 01/28/23 0815   alum & mag hydroxide-simeth (MAALOX/MYLANTA) 200-200-20 MG/5ML suspension 30 mL  30 mL Oral Q4H PRN Jameison Haji T, MD   30 mL at 04/26/22 [id number]   aspirin EC tablet 81 mg  81 mg Oral Daily Kayliegh Boyers T, MD   81 mg at 01/28/23 0813   atorvastatin (LIPITOR) tablet 20 mg  20 mg Oral Daily Jeylin Woodmansee T, MD   20 mg at 01/28/23 0813   atropine 1 % ophthalmic solution 1 drop  1 drop Sublingual QID [name], MD   1 drop at 01/28/23 1600   benztropine (COGENTIN) tablet 0.5 mg  0.5 mg Oral BID Magdelyn Roebuck T, MD   0.5 mg at 01/28/23 1600   clonazePAM (KLONOPIN) tablet 1 mg  1 mg Oral TID PRN He, [name], MD   1 mg at 01/28/23 [id number]  cloZAPine (CLOZARIL) tablet 300 mg  300 mg Oral QHS Rosene Pilling T, MD   300 mg at 01/27/23 2142   diphenhydrAMINE (BENADRYL) capsule 50 mg  50 mg Oral Q6H PRN Donja Tipping T, MD   50 mg at 01/28/23 [id number]   Or   diphenhydrAMINE (BENADRYL) injection 50 mg  50 mg Intramuscular Q6H PRN Shyan Scalisi T, MD   50 mg at 01/01/23 2252   feeding supplement (ENSURE ENLIVE / ENSURE PLUS) liquid 237 mL  237 mL Oral BID BM Brittnae Aschenbrenner T, MD   237 mL at 01/28/23 1352   gabapentin (NEURONTIN) capsule 300 mg  300 mg Oral TID Kelaiah Escalona T, MD   300 mg at 01/28/23 1505   haloperidol (HALDOL) tablet 2 mg  2 mg Oral TID Wendelin Reader T, MD   2 mg at 01/28/23 1505   haloperidol (HALDOL) tablet 5 mg  5 mg Oral Q6H PRN Liliyana Thobe T, MD   5 mg at 01/23/23 1832   Or   haloperidol lactate (HALDOL) injection 5 mg  5 mg  Intramuscular Q6H PRN Chatham [place] T, MD   5 mg at 01/01/23 2252   lisinopril (ZESTRIL) tablet 5 mg  5 mg Oral Daily Elektra Wartman T, MD   5 mg at 01/28/23 0813   magnesium hydroxide (MILK OF MAGNESIA) suspension 30 mL  30 mL Oral Daily PRN Kadeshia Kasparian T, MD   30 mL at 01/20/23 2133   metoprolol succinate (TOPROL-XL) 24 hr tablet 25 mg  25 mg Oral Daily Kevina Piloto T, MD   25 mg at 01/28/23 0813   ondansetron (ZOFRAN) tablet 4 mg  4 mg Oral Q8H PRN [name], NP   4 mg at 09/06/22 2320   paliperidone (INVEGA SUSTENNA) injection 156 mg  156 mg Intramuscular Q28 days Jadelin Eng T, MD   156 mg at 01/15/23 1652   paliperidone (INVEGA) 24 hr tablet 6 mg  6 mg Oral QHS Penny Arrambide T, MD   6 mg at 01/27/23 2142   senna-docusate (Senokot-S) tablet 2 tablet  2 tablet Oral QHS PRN [name], DO   2 tablet at 01/20/23 2133   temazepam (RESTORIL) capsule 15 mg  15 mg Oral QHS Audrie Kuri T, MD   15 mg at 01/27/23 2144   ziprasidone (GEODON) injection 20 mg  20 mg Intramuscular Q12H PRN Davey Limas, [name], MD   20 mg at 07/08/22 1453    Lab Results: No results found for this or any previous visit (from the past 48 hour(s)).  Blood Alcohol level:  Lab Results  Component Value Date   ETH <10 [id number]    Metabolic Disorder Labs: Lab Results  Component Value Date   HGBA1C 5.5 04/10/2022   MPG 111.15 04/10/2022   No results found for: "PROLACTIN" Lab Results  Component Value Date   CHOL 167 11/20/2021   TRIG 107 11/20/2021   HDL 26 (L) 11/20/2021   CHOLHDL 6.4 11/20/2021   VLDL 21 11/20/2021   LDLCALC 120 (H) 11/20/2021    Physical Findings: AIMS: Facial and Oral Movements Muscles of Facial Expression: None, normal Lips and Perioral Area: None, normal Jaw: None, normal Tongue: None, normal,Extremity Movements Upper (arms, wrists, hands, fingers): None, normal Lower (legs, knees, ankles, toes): None, normal, Trunk Movements Neck, shoulders, hips: None,  normal, Overall Severity Severity of abnormal movements (highest score from questions above): None, normal Incapacitation due to abnormal movements: None, normal Patient's awareness of abnormal movements (rate  only patient's report): No Awareness, Dental Status Current problems with teeth and/or dentures?: No Does patient usually wear dentures?: No  CIWA:    COWS:     Musculoskeletal: Strength & Muscle Tone: within normal limits Gait & Station: normal Patient leans: N/A  Psychiatric Specialty Exam:  Presentation  General Appearance:  Appropriate for Environment; Casual; Neat; Well Groomed  Eye Contact: Fair  Speech: Slow  Speech Volume: Decreased  Handedness: Left   Mood and Affect  Mood: Anxious  Affect: Congruent   Thought Process  Thought Processes: Goal Directed  Descriptions of Associations:Circumstantial  Orientation:Partial  Thought Content:Scattered  History of Schizophrenia/Schizoaffective disorder:Yes  Duration of Psychotic Symptoms:Greater than six months  Hallucinations:No data recorded Ideas of Reference:None  Suicidal Thoughts:No data recorded Homicidal Thoughts:No data recorded  Sensorium  Memory: Remote Good  Judgment: Fair  Insight: Fair   Community education officer  Concentration: Fair  Attention Span: Fair  Recall: [employer] of Knowledge: Fair  Language: Fair   Psychomotor Activity  Psychomotor Activity:No data recorded  Assets  Assets:No data recorded  Sleep  Sleep:No data recorded   Physical Exam: Physical Exam Vitals and nursing note reviewed.  Constitutional:      Appearance: Normal appearance.  HENT:     Head: Normocephalic and atraumatic.     Mouth/Throat:     Pharynx: Oropharynx is clear.  Eyes:     Pupils: Pupils are equal, round, and reactive to light.  Cardiovascular:     Rate and Rhythm: Normal rate and regular rhythm.  Pulmonary:     Effort: Pulmonary effort is normal.     Breath  sounds: Normal breath sounds.  Abdominal:     General: Abdomen is flat.     Palpations: Abdomen is soft.  Musculoskeletal:        General: Normal range of motion.  Skin:    General: Skin is warm and dry.  Neurological:     General: No focal deficit present.     Mental Status: He is alert. Mental status is at baseline.  Psychiatric:        Attention and Perception: He is inattentive.        Mood and Affect: Mood normal. Affect is blunt.        Speech: He is noncommunicative.    Review of Systems  Unable to perform ROS: Language   Blood pressure (!) 143/96, pulse 83, temperature 98.9 F (37.2 C), temperature source Oral, resp. rate 19, height '5\' 8"'$  (1.727 m), weight 85.8 kg, SpO2 100 %. Body mass index is 28.76 kg/m.   Treatment Plan Summary: Medication management and Plan no change to medication management.  Continue to focus on discharge planning  Alethia Berthold, MD 01/28/2023, 4:15 PM

## 2023-01-28 NOTE — Plan of Care (Signed)
D- Patient alert and oriented to person, place, and situation. Patient initially presented in an agitated/irritable mood on assessment yelling out all over the unit, disrupting the other patients and staff. Patient wasn't receptive to verbal de-escalation, and so PRN medication was administered. Patient stated that he didn't sleep well last night, but could not go into further detail, due to him having expressive aphasia, from a past stroke. Patient denied SI, HI, AVH at this time. Patient also denied any signs/symptoms of depression and anxiety by shaking his head "no". Patient had no stated goals for today.  A- Scheduled medications administered to patient, per MD orders. Support and encouragement provided.  Routine safety checks conducted every 15 minutes.  Patient informed to notify staff with problems or concerns.  R- No adverse drug reactions noted. Patient contracts for safety at this time. Patient compliant with medications. Patient remains safe at this time.  Problem: Education: Goal: Ability to state activities that reduce stress will improve Outcome: Not Progressing   Problem: Coping: Goal: Ability to identify and develop effective coping behavior will improve Outcome: Not Progressing   Problem: Self-Concept: Goal: Ability to identify factors that promote anxiety will improve Outcome: Not Progressing Goal: Level of anxiety will decrease Outcome: Not Progressing Goal: Ability to modify response to factors that promote anxiety will improve Outcome: Not Progressing   Problem: Education: Goal: Knowledge of General Education information will improve Description: Including pain rating scale, medication(s)/side effects and non-pharmacologic comfort measures Outcome: Not Progressing   Problem: Health Behavior/Discharge Planning: Goal: Ability to manage health-related needs will improve Outcome: Not Progressing   Problem: Clinical Measurements: Goal: Ability to maintain clinical  measurements within normal limits will improve Outcome: Not Progressing Goal: Will remain free from infection Outcome: Not Progressing Goal: Diagnostic test results will improve Outcome: Not Progressing Goal: Respiratory complications will improve Outcome: Not Progressing Goal: Cardiovascular complication will be avoided Outcome: Not Progressing   Problem: Activity: Goal: Risk for activity intolerance will decrease Outcome: Not Progressing   Problem: Nutrition: Goal: Adequate nutrition will be maintained Outcome: Not Progressing   Problem: Coping: Goal: Level of anxiety will decrease Outcome: Not Progressing   Problem: Elimination: Goal: Will not experience complications related to bowel motility Outcome: Not Progressing Goal: Will not experience complications related to urinary retention Outcome: Not Progressing   Problem: Pain Managment: Goal: General experience of comfort will improve Outcome: Not Progressing   Problem: Safety: Goal: Ability to remain free from injury will improve Outcome: Not Progressing   Problem: Skin Integrity: Goal: Risk for impaired skin integrity will decrease Outcome: Not Progressing   Problem: Activity: Goal: Interest or engagement in leisure activities will improve Outcome: Not Progressing   Problem: Coping: Goal: Coping ability will improve Outcome: Not Progressing   Problem: Education: Goal: Will be free of psychotic symptoms Outcome: Not Progressing

## 2023-01-28 NOTE — Group Note (Signed)
LCSW Group Therapy Note  Group Date: 01/28/2023 Start Time: 1300 End Time: 1400   Type of Therapy and Topic:  Group Therapy - Healthy vs Unhealthy Coping Skills  Participation Level:  None   Description of Group The focus of this group was to determine what unhealthy coping techniques typically are used by group members and what healthy coping techniques would be helpful in coping with various problems. Patients were guided in becoming aware of the differences between healthy and unhealthy coping techniques. Patients were asked to identify 2-3 healthy coping skills they would like to learn to use more effectively.  Therapeutic Goals Patients learned that coping is what human beings do all day long to deal with various situations in their lives Patients defined and discussed healthy vs unhealthy coping techniques Patients identified their preferred coping techniques and identified whether these were healthy or unhealthy Patients determined 2-3 healthy coping skills they would like to become more familiar with and use more often. Patients provided support and ideas to each other   Summary of Patient Progress:   Patient unable to participate in group due to expressive aphasia as a result of stroke.   Therapeutic Modalities Cognitive Behavioral Therapy Motivational Interviewing  [name] 01/28/2023  2:36 PM

## 2023-01-29 LAB — CBC WITH DIFFERENTIAL/PLATELET
Abs Immature Granulocytes: 0.01 10*3/uL (ref 0.00–0.07)
Basophils Absolute: 0.2 10*3/uL — ABNORMAL HIGH (ref 0.0–0.1)
Basophils Relative: 4 %
Eosinophils Absolute: 0 10*3/uL (ref 0.0–0.5)
Eosinophils Relative: 1 %
HCT: 39.1 % (ref 39.0–52.0)
Hemoglobin: 13.3 g/dL (ref 13.0–17.0)
Immature Granulocytes: 0 %
Lymphocytes Relative: 26 %
Lymphs Abs: 1.3 10*3/uL (ref 0.7–4.0)
MCH: 30.8 pg (ref 26.0–34.0)
MCHC: 34 g/dL (ref 30.0–36.0)
MCV: 90.5 fL (ref 80.0–100.0)
Monocytes Absolute: 0.6 10*3/uL (ref 0.1–1.0)
Monocytes Relative: 12 %
Neutro Abs: 2.9 10*3/uL (ref 1.7–7.7)
Neutrophils Relative %: 57 %
Platelets: 163 10*3/uL (ref 150–400)
RBC: 4.32 MIL/uL (ref 4.22–5.81)
RDW: 13.2 % (ref 11.5–15.5)
WBC: 5 10*3/uL (ref 4.0–10.5)
nRBC: 0 % (ref 0.0–0.2)

## 2023-01-29 NOTE — Progress Notes (Signed)
Pharmacy Consult - Clozapine     62 yo male ordered clozapine 300 mg PO daily  This patient's order has been reviewed for prescribing contraindications.    Clozapine [facility]S enrollment Verified: yes on 1/7 [facility] patient ID: [id number] Current Outpatient Monitoring: Weekly    Home Regimen:  300 mg PO QHS    Dose Adjustments This Admission: N/A   Labs: Date    [place]    Submitted? 1/7 2600 Yes 1/14 3900 Yes 1/21 3600 Yes 1/29 3400 Yes   2/6 3500 Yes   2/13 3400 Yes 2/20 3100 Yes 2/27 2900 Yes  Plan: Continue clozapine 300 mg QHS Monitor ANC at least weekly while inpatient. Next [place] 02/05/2023   Glean Salvo, PharmD, BCPS Clinical Pharmacist  01/29/2023 10:01 AM

## 2023-01-29 NOTE — Group Note (Signed)
[facility] LCSW Group Therapy Note   Group Date: 01/29/2023 Start Time: 1300 End Time: 1400   Type of Therapy/Topic:  Group Therapy:  Emotion Regulation  Participation Level:  None   Mood:  Description of Group:    The purpose of this group is to assist patients in learning to regulate negative emotions and experience positive emotions. Patients will be guided to discuss ways in which they have been vulnerable to their negative emotions. These vulnerabilities will be juxtaposed with experiences of positive emotions or situations, and patients challenged to use positive emotions to combat negative ones. Special emphasis will be placed on coping with negative emotions in conflict situations, and patients will process healthy conflict resolution skills.  Therapeutic Goals: Patient will identify two positive emotions or experiences to reflect on in order to balance out negative emotions:  Patient will label two or more emotions that they find the most difficult to experience:  Patient will be able to demonstrate positive conflict resolution skills through discussion or role plays:   Summary of Patient Progress: Patient was present in group.  Patient unable to participate in group.  Patient motioned at his wrist as if wearing a bracelet and mumbled/groaned throughout group discussion.    Therapeutic Modalities:   Cognitive Behavioral Therapy Feelings Identification Dialectical Behavioral Therapy   Rozann Lesches, LCSW

## 2023-01-29 NOTE — Progress Notes (Signed)
Patient calm and pleasant during assessment. Pt denies SI/HI/AVH. Pt observed interacting appropriately with staff and peers on the unit. Pt compliant with medication administration per MD orders. Pt being monitored Q 15 minutes for safety per unit protocol, remains safe on the unit

## 2023-01-29 NOTE — [facility] Group Notes (Signed)
[place] Group Notes:  (Nursing/MHT/Case Management/Adjunct)  Date:  01/29/2023  Time:  9:12 AM  Type of Therapy:  Psychoeducational Skills  Participation Level:  Active  Participation Quality:  Appropriate  Affect:  Appropriate  Cognitive:  Appropriate  Insight:  Appropriate  Engagement in Group:  Engaged  Modes of Intervention:  Activity  Summary of Progress/Problems:  Adela Lank [facility] 01/29/2023, 9:12 AM

## 2023-01-29 NOTE — Group Note (Signed)
Recreation Therapy Group Note   Group Topic:Healthy Support Systems  Group Date: 01/29/2023 Start Time: 1000 End Time: [place] Facilitators: [name], LRT, CTRS Location:  Craft Room  Group Description: Straw Bridge. Individually, patients were given 10 plastic drinking straws and an equal length of masking tape. Using the materials provided, patients were instructed to build a free-standing bridge-like structure to suspend an everyday item (ex: puzzle box) off the floor or table surface. All materials were required to be used in Conservation officer, nature. LRT facilitated post-activity discussion reviewing how we, humans, are like the structure we built; when things get too heavy in our life and we do not have adequate supports/coping skills, then we will fall just like the straw-built structure will. LRT focused on how having a "base" or structure on the bottom was necessary for the object to stand, meaning we must be secure and stable first before building on ourselves or others. Patients were encouraged to reflect how the skills used in this activity can be generalized to daily life post discharge.  Affect/Mood: Elevated   Participation Level: Minimal   Participation Quality: N/A   Behavior: Distracted   Speech/Thought Process: Incoherent   Insight: Limited   Judgement: Limited   Modes of Intervention: Activity and Education   Patient Response to Interventions:  Disengaged   Education Outcome:  In group clarification offered    Clinical Observations/Individualized Feedback: Adam [name] was in and out of group multiple times. Pt was fixated on getting into the closet located by the door. Pt was knocking on it and pointing to his wrist yelling "bracelet" repetitively. MHT heard pt yelling and escorted pt out of the craft room. Once group was over, pt came back and LRT asked pt "Can you help me?" Referring to taking the tape off of the structures that peers had made. Pt assisted LRT in taking the  tape off and deconstructing the figures made. Pt left the room afterwards with no issue.   Plan: Continue to engage patient in RT group sessions 2-3x/week.   [name], LRT, CTRS 01/29/2023 11:22 AM

## 2023-01-29 NOTE — Progress Notes (Signed)
[facility] MD Progress Note  01/29/2023 2:17 PM Adam [name]  MRN:  [id number] Subjective: Patient seen and chart reviewed.  No change to behavior.  He is a little more vocal today than usual but overall not different than his typical presentation.  Does not appear to be in any consistent distress. 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[place])   Stroke (HCC)   HTN (hypertension)   HLD (hyperlipidemia)   CAD (coronary artery disease)   Chronic systolic CHF (congestive heart failure) (HCC)   Anxiety   Infection of left earlobe  Total Time spent with patient: 30 minutes  Past Psychiatric History: Past history of schizophrenia  Past Medical History:  Past Medical History:  Diagnosis Date   Myocardial infarction ([place])    Schizophrenia ([place])    Stroke ([place])     Past Surgical History:  Procedure Laterality Date   NO PAST SURGERIES     Family History:  Family History  Problem Relation Age of Onset   Hypertension Mother    Family Psychiatric  History: See previous Social History:  Social History   Substance and Sexual Activity  Alcohol Use Not Currently     Social History   Substance and Sexual Activity  Drug Use Not Currently    Social History   Socioeconomic History   Marital status: Single    Spouse name: Not on file   Number of children: Not on file   Years of education: Not on file   Highest education level: Not on file  Occupational History   Not on file  Tobacco Use   Smoking status: Never    Passive exposure: Never   Smokeless tobacco: Never  Vaping Use   Vaping Use: Unknown  Substance and Sexual Activity   Alcohol use: Not Currently   Drug use: Not Currently   Sexual activity: Not Currently  Other Topics Concern   Not on file  Social  History Narrative   Not on file   Social Determinants of Health   Financial Resource Strain: Not on file  Food Insecurity: Not on file  Transportation Needs: Not on file  Physical Activity: Not on file  Stress: Not on file  Social Connections: Not on file   Additional Social History:  Specify valuables returned:  (none)                      Sleep: Fair  Appetite:  Fair  Current Medications: Current Facility-Administered Medications  Medication Dose Route Frequency Provider Last Rate Last Admin   acetaminophen (TYLENOL) tablet 650 mg  650 mg Oral Q6H PRN Addelyn Alleman T, MD   650 mg at 01/28/23 0815   alum & mag hydroxide-simeth (MAALOX/MYLANTA) 200-200-20 MG/5ML suspension 30 mL  30 mL Oral Q4H PRN Anisah Kuck T, MD   30 mL at 04/26/22 [id number]   aspirin EC tablet 81 mg  81 mg Oral Daily Marquis Down T, MD   81 mg at 01/29/23 0830   atorvastatin (LIPITOR) tablet 20 mg  20 mg Oral Daily Claudette Wermuth T, MD   20 mg at 01/29/23 0830   atropine 1 % ophthalmic solution 1 drop  1 drop Sublingual QID [name], MD   1 drop at 01/29/23 1204   benztropine (COGENTIN) tablet 0.5 mg  0.5 mg Oral BID Khadija Thier, [name], MD  0.5 mg at 01/29/23 0830   clonazePAM (KLONOPIN) tablet 1 mg  1 mg Oral TID PRN He, [name], MD   1 mg at 01/28/23 0813   cloZAPine (CLOZARIL) tablet 300 mg  300 mg Oral QHS Iktan Aikman T, MD   300 mg at 01/28/23 2105   diphenhydrAMINE (BENADRYL) capsule 50 mg  50 mg Oral Q6H PRN Asha Grumbine, [name], MD   50 mg at 01/28/23 2105   Or   diphenhydrAMINE (BENADRYL) injection 50 mg  50 mg Intramuscular Q6H PRN Ryn Peine, [name], MD   50 mg at 01/01/23 2252   feeding supplement (ENSURE ENLIVE / ENSURE PLUS) liquid 237 mL  237 mL Oral BID BM [name] Jablonowski T, MD   237 mL at 01/29/23 0917   gabapentin (NEURONTIN) capsule 300 mg  300 mg Oral TID Kashmir Leedy, [name], MD   300 mg at 01/29/23 [id number]   haloperidol (HALDOL) tablet 2 mg  2 mg Oral TID Najae Rathert, [name], MD   2 mg at 01/29/23 [id number]    haloperidol (HALDOL) tablet 5 mg  5 mg Oral Q6H PRN Derk Doubek, [name], MD   5 mg at 01/23/23 [id number]   Or   haloperidol lactate (HALDOL) injection 5 mg  5 mg Intramuscular Q6H PRN Maor Meckel, [name], MD   5 mg at 01/01/23 2252   lisinopril (ZESTRIL) tablet 5 mg  5 mg Oral Daily Wayne Wicklund, [name], MD   5 mg at 01/29/23 0830   magnesium hydroxide (MILK OF MAGNESIA) suspension 30 mL  30 mL Oral Daily PRN Khilynn Borntreger, [name], MD   30 mL at 01/20/23 2133   metoprolol succinate (TOPROL-XL) 24 hr tablet 25 mg  25 mg Oral Daily Yannick Steuber T, MD   25 mg at 01/29/23 0830   ondansetron (ZOFRAN) tablet 4 mg  4 mg Oral Q8H PRN Deloria Lair, NP   4 mg at 09/06/22 2320   paliperidone (INVEGA SUSTENNA) injection 156 mg  156 mg Intramuscular Q28 days Aalia Greulich, [name], MD   156 mg at 01/15/23 1652   paliperidone (INVEGA) 24 hr tablet 6 mg  6 mg Oral QHS Harlynn Kimbell T, MD   6 mg at 01/28/23 2105   senna-docusate (Senokot-S) tablet 2 tablet  2 tablet Oral QHS PRN [name], DO   2 tablet at 01/20/23 2133   temazepam (RESTORIL) capsule 15 mg  15 mg Oral QHS Fabian Walder T, MD   15 mg at 01/28/23 2107   ziprasidone (GEODON) injection 20 mg  20 mg Intramuscular Q12H PRN Vanesa Renier, [name], MD   20 mg at 07/08/22 1453    Lab Results:  Results for orders placed or performed during the hospital encounter of 03/19/22 (from the past 48 hour(s))  CBC with Differential/Platelet     Status: Abnormal   Collection Time: 01/29/23  8:17 AM  Result Value Ref Range   WBC 5.0 4.0 - 10.5 K/uL   RBC 4.32 4.22 - 5.81 MIL/uL   Hemoglobin 13.3 13.0 - 17.0 g/dL   HCT 39.1 39.0 - 52.0 %   MCV 90.5 80.0 - 100.0 fL   MCH 30.8 26.0 - 34.0 pg   MCHC 34.0 30.0 - 36.0 g/dL   RDW 13.2 11.5 - 15.5 %   Platelets 163 150 - 400 K/uL   nRBC 0.0 0.0 - 0.2 %   Neutrophils Relative % 57 %   Neutro Abs 2.9 1.7 - 7.7 K/uL   Lymphocytes Relative 26 %  Lymphs Abs 1.3 0.7 - 4.0 K/uL   Monocytes Relative 12 %   Monocytes Absolute 0.6 0.1  - 1.0 K/uL   Eosinophils Relative 1 %   Eosinophils Absolute 0.0 0.0 - 0.5 K/uL   Basophils Relative 4 %   Basophils Absolute 0.2 (H) 0.0 - 0.1 K/uL   Immature Granulocytes 0 %   Abs Immature Granulocytes 0.01 0.00 - 0.07 K/uL    Comment: Performed at [facility], [address] [place], [place] [address]    Blood Alcohol level:  Lab Results  Component Value Date   [facility] <10 [id number]    Metabolic Disorder Labs: Lab Results  Component Value Date   HGBA1C 5.5 04/10/2022   MPG 111.15 04/10/2022   No results found for: "PROLACTIN" Lab Results  Component Value Date   CHOL 167 11/20/2021   TRIG 107 11/20/2021   HDL 26 (L) 11/20/2021   CHOLHDL 6.4 11/20/2021   VLDL 21 11/20/2021   LDLCALC 120 (H) 11/20/2021    Physical Findings: AIMS: Facial and Oral Movements Muscles of Facial Expression: None, normal Lips and Perioral Area: None, normal Jaw: None, normal Tongue: None, normal,Extremity Movements Upper (arms, wrists, hands, fingers): None, normal Lower (legs, knees, ankles, toes): None, normal, Trunk Movements Neck, shoulders, hips: None, normal, Overall Severity Severity of abnormal movements (highest score from questions above): None, normal Incapacitation due to abnormal movements: None, normal Patient's awareness of abnormal movements (rate only patient's report): No Awareness, Dental Status Current problems with teeth and/or dentures?: No Does patient usually wear dentures?: No  CIWA:    COWS:     Musculoskeletal: Strength & Muscle Tone: within normal limits Gait & Station: normal Patient leans: N/A  Psychiatric Specialty Exam:  Presentation  General Appearance:  Appropriate for Environment; Casual; Neat; Well Groomed  Eye Contact: Fair  Speech: Slow  Speech Volume: Decreased  Handedness: Left   Mood and Affect  Mood: Anxious  Affect: Congruent   Thought Process  Thought Processes: Goal Directed  Descriptions of  Associations:Circumstantial  Orientation:Partial  Thought Content:Scattered  History of Schizophrenia/Schizoaffective disorder:Yes  Duration of Psychotic Symptoms:Greater than six months  Hallucinations:No data recorded Ideas of Reference:None  Suicidal Thoughts:No data recorded Homicidal Thoughts:No data recorded  Sensorium  Memory: Remote Good  Judgment: Fair  Insight: Fair   Community education officer  Concentration: Fair  Attention Span: Fair  Recall: [place] of Knowledge: Fair  Language: Fair   Psychomotor Activity  Psychomotor Activity:No data recorded  Assets  Assets:No data recorded  Sleep  Sleep:No data recorded   Physical Exam: Physical Exam Vitals reviewed.  Constitutional:      Appearance: Normal appearance.  HENT:     Head: Normocephalic and atraumatic.     Mouth/Throat:     Pharynx: Oropharynx is clear.  Eyes:     Pupils: Pupils are equal, round, and reactive to light.  Cardiovascular:     Rate and Rhythm: Normal rate and regular rhythm.  Pulmonary:     Effort: Pulmonary effort is normal.     Breath sounds: Normal breath sounds.  Abdominal:     General: Abdomen is flat.     Palpations: Abdomen is soft.  Musculoskeletal:        General: Normal range of motion.  Skin:    General: Skin is warm and dry.  Neurological:     General: No focal deficit present.     Mental Status: He is alert. Mental status is at baseline.  Psychiatric:  Attention and Perception: He is inattentive.        Mood and Affect: Mood normal. Affect is blunt.        Speech: He is noncommunicative.        Thought Content: Thought content normal.    Review of Systems  Unable to perform ROS: Language   Blood pressure 122/88, pulse 91, temperature 98.9 F (37.2 C), temperature source Oral, resp. rate 19, height '5\' 8"'$  (1.727 m), weight 85.8 kg, SpO2 100 %. Body mass index is 28.76 kg/m.   Treatment Plan Summary: Daily contact with patient to  assess and evaluate symptoms and progress in treatment, Medication management, and Plan completely stable.  No change in presentation.  Remains primarily in need of placement.  Alethia Berthold, MD 01/29/2023, 2:17 PM

## 2023-01-29 NOTE — Plan of Care (Signed)
Patient in & out of his room. No irritable behaviors noted. Patient denies SI,HI and AVH. Compliant with medications. Appetite and energy level good. Support and encouragement given.

## 2023-01-30 DIAGNOSIS — I69320 Aphasia following cerebral infarction: Secondary | ICD-10-CM

## 2023-01-30 DIAGNOSIS — I69319 Unspecified symptoms and signs involving cognitive functions following cerebral infarction: Secondary | ICD-10-CM | POA: Insufficient documentation

## 2023-01-30 NOTE — Progress Notes (Signed)
[facility] MD Progress Note  01/30/2023 10:43 AM Adam [name]  MRN:  [id number] Subjective: Follow-up this 62 year old gentleman with schizophrenia and history of aphasia.  Behavior today is similar to what it is most days.  No violence reported today.  No threatening behavior.  Patient was smiling when I came to talk to him.  At times mostly seems to understand what is said to him although his speech is no more improved than previously.  Medically appears to be stable. 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)  Total Time spent with patient: 30 minutes  Past Psychiatric History: History of schizophrenia complicated by left-sided stroke causing aphasia  Past Medical History:  Past Medical History:  Diagnosis Date   Myocardial infarction ([place])    Schizophrenia ([place])    Stroke ([place])     Past Surgical History:  Procedure Laterality Date   NO PAST SURGERIES     Family History:  Family History  Problem Relation Age of Onset   Hypertension Mother    Family Psychiatric  History: See previous Social History:  Social History   Substance and Sexual Activity  Alcohol Use Not Currently     Social History   Substance and Sexual Activity  Drug Use Not Currently    Social History   Socioeconomic History   Marital status: Single    Spouse name: Not on file   Number of children: Not on file   Years of education: Not on file   Highest education level: Not on file  Occupational History   Not on file  Tobacco Use   Smoking status: Never    Passive exposure:  Never   Smokeless tobacco: Never  Vaping Use   Vaping Use: Unknown  Substance and Sexual Activity   Alcohol use: Not Currently   Drug use: Not Currently   Sexual activity: Not Currently  Other Topics Concern   Not on file  Social History Narrative   Not on file   Social Determinants of Health   Financial Resource Strain: Not on file  Food Insecurity: Not on file  Transportation Needs: Not on file  Physical Activity: Not on file  Stress: Not on file  Social Connections: Not on file   Additional Social History:  Specify valuables returned:  (none)                      Sleep: Fair  Appetite:  Fair  Current Medications: Current Facility-Administered Medications  Medication Dose Route Frequency Provider Last Rate Last Admin   acetaminophen (TYLENOL) tablet 650 mg  650 mg Oral Q6H PRN Cortina Vultaggio T, MD   650 mg at 01/28/23 0815   alum & mag hydroxide-simeth (MAALOX/MYLANTA) 200-200-20 MG/5ML suspension 30 mL  30 mL Oral Q4H PRN Taliah Porche T, MD   30 mL at 04/26/22 [id number]   aspirin EC tablet 81 mg  81 mg Oral Daily Delta Deshmukh, [name], MD   81 mg at 01/30/23 0831   atorvastatin (LIPITOR) tablet 20 mg  20 mg Oral Daily Alexiana Laverdure, [name], MD   20 mg at 01/30/23 [phone]  atropine 1 % ophthalmic solution 1 drop  1 drop Sublingual QID [name], MD   1 drop at 01/30/23 0830   benztropine (COGENTIN) tablet 0.5 mg  0.5 mg Oral BID Marylee Belzer, [name], MD   0.5 mg at 01/30/23 0831   clonazePAM (KLONOPIN) tablet 1 mg  1 mg Oral TID PRN He, [name], MD   1 mg at 01/28/23 0813   cloZAPine (CLOZARIL) tablet 300 mg  300 mg Oral QHS Shar Paez T, MD   300 mg at 01/29/23 2103   diphenhydrAMINE (BENADRYL) capsule 50 mg  50 mg Oral Q6H PRN Suresh Audi, [name], MD   50 mg at 01/28/23 2105   Or   diphenhydrAMINE (BENADRYL) injection 50 mg  50 mg Intramuscular Q6H PRN Khiya Friese, [name], MD   50 mg at 01/01/23 2252   feeding supplement (ENSURE ENLIVE / ENSURE PLUS) liquid 237 mL  237 mL Oral BID BM  Jan Walters T, MD   237 mL at 01/30/23 0947   gabapentin (NEURONTIN) capsule 300 mg  300 mg Oral TID Perfecto Purdy T, MD   300 mg at 01/30/23 0900   haloperidol (HALDOL) tablet 2 mg  2 mg Oral TID Delaina Fetsch, [name], MD   2 mg at 01/30/23 0900   haloperidol (HALDOL) tablet 5 mg  5 mg Oral Q6H PRN Georgeanne Frankland, [name], MD   5 mg at 01/23/23 [id number]   Or   haloperidol lactate (HALDOL) injection 5 mg  5 mg Intramuscular Q6H PRN Zilah Villaflor, [name], MD   5 mg at 01/01/23 2252   lisinopril (ZESTRIL) tablet 5 mg  5 mg Oral Daily Daltin Crist, [name], MD   5 mg at 01/30/23 0831   magnesium hydroxide (MILK OF MAGNESIA) suspension 30 mL  30 mL Oral Daily PRN Ronnetta Currington, [name], MD   30 mL at 01/20/23 2133   metoprolol succinate (TOPROL-XL) 24 hr tablet 25 mg  25 mg Oral Daily Alayasia Breeding T, MD   25 mg at 01/30/23 0831   ondansetron (ZOFRAN) tablet 4 mg  4 mg Oral Q8H PRN Deloria Lair, NP   4 mg at 09/06/22 2320   paliperidone (INVEGA SUSTENNA) injection 156 mg  156 mg Intramuscular Q28 days Shernell Saldierna, [name], MD   156 mg at 01/15/23 1652   paliperidone (INVEGA) 24 hr tablet 6 mg  6 mg Oral QHS Ayline Dingus T, MD   6 mg at 01/29/23 2103   senna-docusate (Senokot-S) tablet 2 tablet  2 tablet Oral QHS PRN [name], DO   2 tablet at 01/20/23 2133   temazepam (RESTORIL) capsule 15 mg  15 mg Oral QHS Ladonte Verstraete T, MD   15 mg at 01/29/23 2103   ziprasidone (GEODON) injection 20 mg  20 mg Intramuscular Q12H PRN Deboraha Goar, [name], MD   20 mg at 07/08/22 1453    Lab Results:  Results for orders placed or performed during the hospital encounter of 03/19/22 (from the past 48 hour(s))  CBC with Differential/Platelet     Status: Abnormal   Collection Time: 01/29/23  8:17 AM  Result Value Ref Range   WBC 5.0 4.0 - 10.5 K/uL   RBC 4.32 4.22 - 5.81 MIL/uL   Hemoglobin 13.3 13.0 - 17.0 g/dL   HCT 39.1 39.0 - 52.0 %   MCV 90.5 80.0 - 100.0 fL   MCH 30.8 26.0 - 34.0 pg   MCHC 34.0 30.0 - 36.0 g/dL   RDW 13.2 11.5 -  15.5 %   Platelets 163 150 - 400 K/uL   nRBC 0.0 0.0 - 0.2 %   Neutrophils Relative % 57 %   Neutro Abs 2.9 1.7 - 7.7 K/uL   Lymphocytes Relative 26 %   Lymphs Abs 1.3 0.7 - 4.0 K/uL   Monocytes Relative 12 %   Monocytes Absolute 0.6 0.1 - 1.0 K/uL   Eosinophils Relative 1 %   Eosinophils Absolute 0.0 0.0 - 0.5 K/uL   Basophils Relative 4 %   Basophils Absolute 0.2 (H) 0.0 - 0.1 K/uL   Immature Granulocytes 0 %   Abs Immature Granulocytes 0.01 0.00 - 0.07 K/uL    Comment: Performed at [facility], [address] [place], [place] [address]    Blood Alcohol level:  Lab Results  Component Value Date   [facility] <10 [id number]    Metabolic Disorder Labs: Lab Results  Component Value Date   HGBA1C 5.5 04/10/2022   MPG 111.15 04/10/2022   No results found for: "PROLACTIN" Lab Results  Component Value Date   CHOL 167 11/20/2021   TRIG 107 11/20/2021   HDL 26 (L) 11/20/2021   CHOLHDL 6.4 11/20/2021   VLDL 21 11/20/2021   LDLCALC 120 (H) 11/20/2021    Physical Findings: AIMS: Facial and Oral Movements Muscles of Facial Expression: None, normal Lips and Perioral Area: None, normal Jaw: None, normal Tongue: None, normal,Extremity Movements Upper (arms, wrists, hands, fingers): None, normal Lower (legs, knees, ankles, toes): None, normal, Trunk Movements Neck, shoulders, hips: None, normal, Overall Severity Severity of abnormal movements (highest score from questions above): None, normal Incapacitation due to abnormal movements: None, normal Patient's awareness of abnormal movements (rate only patient's report): No Awareness, Dental Status Current problems with teeth and/or dentures?: No Does patient usually wear dentures?: No  CIWA:    COWS:     Musculoskeletal: Strength & Muscle Tone: within normal limits Gait & Station: normal Patient leans: N/A  Psychiatric Specialty Exam:  Presentation  General Appearance:  Appropriate for Environment; Casual; Neat;  Well Groomed  Eye Contact: Fair  Speech: Slow  Speech Volume: Decreased  Handedness: Left   Mood and Affect  Mood: Anxious  Affect: Congruent   Thought Process  Thought Processes: Goal Directed  Descriptions of Associations:Circumstantial  Orientation:Partial  Thought Content:Scattered  History of Schizophrenia/Schizoaffective disorder:Yes  Duration of Psychotic Symptoms:Greater than six months  Hallucinations:No data recorded Ideas of Reference:None  Suicidal Thoughts:No data recorded Homicidal Thoughts:No data recorded  Sensorium  Memory: Remote Good  Judgment: Fair  Insight: Fair   Community education officer  Concentration: Fair  Attention Span: Fair  Recall: [employer] of Knowledge: Fair  Language: Fair   Psychomotor Activity  Psychomotor Activity:No data recorded  Assets  Assets:No data recorded  Sleep  Sleep:No data recorded   Physical Exam: Physical Exam Vitals and nursing note reviewed.  Constitutional:      Appearance: Normal appearance.  HENT:     Head: Normocephalic and atraumatic.     Mouth/Throat:     Pharynx: Oropharynx is clear.  Eyes:     Pupils: Pupils are equal, round, and reactive to light.  Cardiovascular:     Rate and Rhythm: Normal rate and regular rhythm.  Pulmonary:     Effort: Pulmonary effort is normal.     Breath sounds: Normal breath sounds.  Abdominal:     General: Abdomen is flat.     Palpations: Abdomen is soft.  Musculoskeletal:        General: Normal  range of motion.  Skin:    General: Skin is warm and dry.  Neurological:     General: No focal deficit present.     Mental Status: He is alert. Mental status is at baseline.  Psychiatric:        Attention and Perception: He is inattentive.        Mood and Affect: Mood normal. Affect is labile.        Speech: He is noncommunicative. Speech is tangential.    Review of Systems  Unable to perform ROS: Language   Blood pressure 106/80,  pulse 89, temperature 97.8 F (36.6 C), temperature source Oral, resp. rate 18, height '5\' 8"'$  (1.727 m), weight 85.8 kg, SpO2 98 %. Body mass index is 28.76 kg/m.   Treatment Plan Summary: Medication management and Plan patient stable on current medicine.  No new complaints.  Managed well on the unit with regular psychosocial management and current medicine.  Attended [place] today regarding placement.  Hospital continues to be focused on finding appropriate placement options.  Alethia Berthold, MD 01/30/2023, 10:43 AM

## 2023-01-30 NOTE — B[facility] Counselor (Signed)
Weekly meeting to address the patient's needs.  Meeting attendees: [facility]: Sherre Scarlet, Billey Gosling [facility]: Rebeca Alert and Myrtha Mantis, CSWs TOC Leadership: Mechele Collin BMU Leadership: Aris Lot [facility]: Dr. Weber Cooks  CSW team arrived to meeting late due to connection issues in attempting to log into meeting.   Upon connection CSW observed treatment team to be in discussion on FL2.  It was suggested that the FL2 have more specific FL2 completed that focuses more on the symptomatology of the patient.  Keely updated that a provider has been found to complete a psychological on the patient.   CSW Legrand Como explained that he sends specific information related to the referral city in question when making referrals to agencies.  Legrand Como further updated that the entirety of [facility] has been contacted in effort to locate a possible group home bed.  Zack updated that the meeting was held on Monday 01/28/2023 where it was dicsussed looking into guardianship for the patient.  Johnathon Olden updated on most recent figures disclosed by patients mother.  CSW informed Edwyna Ready that the patient's mother would like a phone call.    Barbaraann Rondo pointed out patient and his mother's funds were originally going into the same account and were to be separated, though she is unsure of the outcome of that.    CSW Herschel Sen[place] updated that at this time [facility] [place], where patients mother resides, is currenlty full, however would start at $5495 a month per [place] their Runner, broadcasting/film/video.  CSW Legrand Como posed question on whether treatment team should pursue placement for patient in private pay facilities, with the concern that the patient does have money in the bank to assist, however, concerns for longevity of this as a plan.  It was recommended to not pursue private pay facilities.  Assunta Curtis, MSW, LCSW 01/30/2023 11:41 AM

## 2023-01-30 NOTE — Plan of Care (Signed)
  Problem: Nutrition: Goal: Adequate nutrition will be maintained Outcome: Progressing   Problem: Activity: Goal: Interest or engagement in leisure activities will improve Outcome: Progressing   Problem: Coping: Goal: Coping ability will improve Outcome: Progressing

## 2023-01-30 NOTE — Plan of Care (Signed)
D- Patient alert and oriented to person, place, and situation. Patient presented in a preoccupied, but more pleasant mood on assessment than he did on Monday. Patient is childlike and due to a past stroke, he has expressive aphasia. Patient was fixated on someone combing his hair and putting it in a braided ponytail. Patient stated that he slept good last night and had no complaints to voice to this Probation officer. Patient denied SI, HI, AVH, and pain at this time. Patient also denied any signs/symptoms of depression and anxiety by shaking his head and saying"no", while smiling. Patient had no stated goals for today.  A- Scheduled medications administered to patient, per MD orders. Support and encouragement provided.  Routine safety checks conducted every 15 minutes.  Patient informed to notify staff with problems or concerns.  R- No adverse drug reactions noted. Patient contracts for safety at this time. Patient compliant with medications and treatment plan. Patient receptive, calm, and cooperative. Patient interacts well with others on the unit. Patient remains safe at this time.  Problem: Education: Goal: Ability to state activities that reduce stress will improve Outcome: Progressing   Problem: Coping: Goal: Ability to identify and develop effective coping behavior will improve Outcome: Progressing   Problem: Self-Concept: Goal: Ability to identify factors that promote anxiety will improve Outcome: Progressing Goal: Level of anxiety will decrease Outcome: Progressing Goal: Ability to modify response to factors that promote anxiety will improve Outcome: Progressing   Problem: Education: Goal: Knowledge of General Education information will improve Description: Including pain rating scale, medication(s)/side effects and non-pharmacologic comfort measures Outcome: Progressing   Problem: Health Behavior/Discharge Planning: Goal: Ability to manage health-related needs will improve Outcome:  Progressing   Problem: Clinical Measurements: Goal: Ability to maintain clinical measurements within normal limits will improve Outcome: Progressing Goal: Will remain free from infection Outcome: Progressing Goal: Diagnostic test results will improve Outcome: Progressing Goal: Respiratory complications will improve Outcome: Progressing Goal: Cardiovascular complication will be avoided Outcome: Progressing   Problem: Activity: Goal: Risk for activity intolerance will decrease Outcome: Progressing   Problem: Nutrition: Goal: Adequate nutrition will be maintained Outcome: Progressing   Problem: Coping: Goal: Level of anxiety will decrease Outcome: Progressing   Problem: Elimination: Goal: Will not experience complications related to bowel motility Outcome: Progressing Goal: Will not experience complications related to urinary retention Outcome: Progressing   Problem: Pain Managment: Goal: General experience of comfort will improve Outcome: Progressing   Problem: Safety: Goal: Ability to remain free from injury will improve Outcome: Progressing   Problem: Skin Integrity: Goal: Risk for impaired skin integrity will decrease Outcome: Progressing   Problem: Activity: Goal: Interest or engagement in leisure activities will improve Outcome: Progressing   Problem: Coping: Goal: Coping ability will improve Outcome: Progressing   Problem: Education: Goal: Will be free of psychotic symptoms Outcome: Progressing

## 2023-01-30 NOTE — Group Note (Unsigned)
Date:  01/30/2023 Time:  11:45 PM  Group Topic/Focus:  Personal Choices and Values:   The focus of this group is to help patients assess and explore the importance of values in their lives, how their values affect their decisions, how they express their values and what opposes their expression.     Participation Level:  {BHH PARTICIPATION WO:[id number]  Participation Quality:  {BHH PARTICIPATION QUALITY:22265}  Affect:  {BHH AFFECT:22266}  Cognitive:  {BHH COGNITIVE:22267}  Insight: {BHH Insight2:20797}  Engagement in Group:  {BHH ENGAGEMENT IN BP:8198245  Modes of Intervention:  {BHH MODES OF INTERVENTION:22269}  Additional Comments:  ***  Brandan Robicheaux 01/30/2023, 11:45 PM

## 2023-01-30 NOTE — Group Note (Signed)
LCSW Group Therapy Note  Group Date: 01/30/2023 Start Time: 1300 End Time: 1400   Type of Therapy and Topic:  Group Therapy - How To Cope with Nervousness about Discharge   Participation Level:  Did Not Attend   Description of Group This process group involved identification of patients' feelings about discharge. Some of them are scheduled to be discharged soon, while others are new admissions, but each of them was asked to share thoughts and feelings surrounding discharge from the hospital. One common theme was that they are excited at the prospect of going home, while another was that many of them are apprehensive about sharing why they were hospitalized. Patients were given the opportunity to discuss these feelings with their peers in preparation for discharge.  Therapeutic Goals  Patient will identify their overall feelings about pending discharge. Patient will think about how they might proactively address issues that they believe will once again arise once they get home (i.e. with parents). Patients will participate in discussion about having hope for change.   Summary of Patient Progress:   Patient unable to participate in group due to expressive aphasia as result of stroke.   Therapeutic Modalities Cognitive Behavioral Therapy   [name] 01/30/2023  3:00 PM

## 2023-01-30 NOTE — Progress Notes (Signed)
Patient was given Gatorade and encouraged to increase fluid intake to help bring up BP. Patient verbalized understanding, by saying "oh yeah", and walked back to the dayroom on the back hall. This writer will pass this information along to the oncoming shift.   01/30/23 1805  Vital Signs  Temp 98.3 F (36.8 C)  Temp Source Oral  Pulse Rate 97  Pulse Rate Source Monitor  Resp 18  BP (!) 82/65  BP Location Right Arm  BP Method Automatic  Patient Position (if appropriate) Sitting  Oxygen Therapy  SpO2 95 %

## 2023-01-30 NOTE — Group Note (Signed)
Recreation Therapy Group Note   Group Topic:Leisure Education  Group Date: 01/30/2023 Start Time: 1000 End Time: 1050 Facilitators: [name], LRT, CTRS Location:  Craft Room  Group Description: Leisure. Patients were given the option to choose from coloring mandalas, playing apples to apples, or making origami duration of session while listening to music. LRT and pts discussed the importance of participating in leisure during their free time and when they're outside of the hospital. Pt identified two leisure interests and shared with the group.   Affect/Mood: Appropriate   Participation Level: Active   Participation Quality: Independent   Behavior: Alert   Speech/Thought Process: Incoherent   Insight: Limited   Judgement: Fair    Modes of Intervention: Activity and Guided Discussion   Patient Response to Interventions:  Engaged   Education Outcome:  In group clarification offered    Clinical Observations/Individualized Feedback: Adam [name] was mostly active in their participation of session activities and group discussion. Pt was in and out of group multiple times. Pt created 2 origami birds during his time in group. He was mostly quiet and not a disturbance to others.    Plan: Continue to engage patient in RT group sessions 2-3x/week.   [name], LRT, CTRS 01/30/2023 11:10 AM

## 2023-01-30 NOTE — BHH Counselor (Signed)
CSW contacted [facility] [address] and spoke with Barista the Runner, broadcasting/film/video.    Jinny Blossom reports that the Assisted Living is currently full.  She reports that the facility is private pay only.  She reports that housing has studio, 1 and 2 bedroom homes.  Cost starts at $5495/month.  Assunta Curtis, MSW, LCSW 01/30/2023 9:35 AM

## 2023-01-31 NOTE — Progress Notes (Signed)
Patient calm and pleasant during assessment. Pt denies SI/HI/AVH. Pt observed interacting appropriately with staff and peers on the unit. Pt compliant with medication administration per MD orders. Pt being monitored Q 15 minutes for safety per unit protocol, remains safe on the unit

## 2023-01-31 NOTE — Plan of Care (Signed)
No issues verbalized. No irritable behaviors noted today. Appropriate with staff & peers. Compliant with medications. Appetite and energy level good. Support and encouragement given.

## 2023-01-31 NOTE — Progress Notes (Signed)
Active on the unit pacing the halls whe not in dayroom. . Being over protective of the day room he conciders his own personal space. He is med complainant and took his meds without issue today.  When asked Adam [name] denies si /hi/ avh He is safe on the unit with q15 minute safety checks.       C Butler-Nicholson, LPN

## 2023-01-31 NOTE — BHH Group Notes (Signed)
[place] [facility] Group Notes:  (Nursing/MHT/Case Management/Adjunct)  Date:  01/31/2023  Time:  9:07 AM  Type of Therapy:   community meeting  Participation Level:  Did Not Attend    Adam [name] 01/31/2023, 9:07 AM

## 2023-01-31 NOTE — BHH Group Notes (Signed)
[facility] Group Notes:  (Nursing/MHT/Case Management/Adjunct)  Date:  01/31/2023  Time:  4:44 PM  Type of Therapy:  Psychoeducational Skills  Participation Level:  Minimal  Participation Quality:  Appropriate  Affect:  Appropriate  Cognitive:  Appropriate  Insight:  Appropriate  Engagement in Group:  Limited  Modes of Intervention:  Activity  Summary of Progress/Problems:  Adela Lank [facility] 01/31/2023, 4:44 PM

## 2023-01-31 NOTE — Progress Notes (Signed)
[facility] MD Progress Note  01/31/2023 2:31 PM Adam [name]  MRN:  [id number] Subjective: Follow-up patient with schizophrenia.  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)  Total Time spent with patient: 30 minutes  Past Psychiatric History: History of schizophrenia  Past Medical History:  Past Medical History:  Diagnosis Date   Myocardial infarction ([place])    Schizophrenia ([place])    Stroke ([place])     Past Surgical History:  Procedure Laterality Date   NO PAST SURGERIES     Family History:  Family History  Problem Relation Age of Onset   Hypertension Mother    Family Psychiatric  History: See previous Social History:  Social History   Substance and Sexual Activity  Alcohol Use Not Currently     Social History   Substance and Sexual Activity  Drug Use Not Currently    Social History   Socioeconomic History   Marital status: Single    Spouse name: Not on file   Number of children: Not on file   Years of education: Not on file   Highest education level: Not on file  Occupational History   Not on file  Tobacco Use   Smoking status: Never    Passive exposure: Never   Smokeless tobacco: Never  Vaping Use   Vaping Use: Unknown  Substance and Sexual Activity   Alcohol use: Not Currently   Drug use: Not Currently   Sexual activity: Not Currently  Other Topics Concern   Not on file  Social History Narrative   Not on file   Social Determinants of Health   Financial Resource Strain: Not on file  Food  Insecurity: Not on file  Transportation Needs: Not on file  Physical Activity: Not on file  Stress: Not on file  Social Connections: Not on file   Additional Social History:  Specify valuables returned:  (none)                      Sleep: Fair  Appetite:  Fair  Current Medications: Current Facility-Administered Medications  Medication Dose Route Frequency Provider Last Rate Last Admin   acetaminophen (TYLENOL) tablet 650 mg  650 mg Oral Q6H PRN Gagandeep Pettet T, MD   650 mg at 01/28/23 0815   alum & mag hydroxide-simeth (MAALOX/MYLANTA) 200-200-20 MG/5ML suspension 30 mL  30 mL Oral Q4H PRN Jersey Espinoza T, MD   30 mL at 04/26/22 [id number]   aspirin EC tablet 81 mg  81 mg Oral Daily Eufemio Strahm, [name], MD   81 mg at 01/31/23 [id number]   atorvastatin (LIPITOR) tablet 20 mg  20 mg Oral Daily Karman Veney T, MD   20 mg at 01/31/23 0814   atropine 1 % ophthalmic solution 1 drop  1 drop Sublingual QID [name], MD   1 drop at 01/31/23 1236   benztropine (COGENTIN) tablet 0.5 mg  0.5 mg Oral BID Millenia Waldvogel T, MD   0.5 mg at 01/31/23 0814   clonazePAM (KLONOPIN) tablet 1 mg  1 mg Oral TID PRN He, [name], MD  1 mg at 01/28/23 0813   cloZAPine (CLOZARIL) tablet 300 mg  300 mg Oral QHS Orchid Glassberg T, MD   300 mg at 01/30/23 2109   diphenhydrAMINE (BENADRYL) capsule 50 mg  50 mg Oral Q6H PRN Marcina Kinnison T, MD   50 mg at 01/28/23 2105   Or   diphenhydrAMINE (BENADRYL) injection 50 mg  50 mg Intramuscular Q6H PRN Lurline Caver T, MD   50 mg at 01/01/23 2252   feeding supplement (ENSURE ENLIVE / ENSURE PLUS) liquid 237 mL  237 mL Oral BID BM Bryleigh Ottaway T, MD   237 mL at 01/31/23 1030   gabapentin (NEURONTIN) capsule 300 mg  300 mg Oral TID Revere Maahs T, MD   300 mg at 01/31/23 1028   haloperidol (HALDOL) tablet 2 mg  2 mg Oral TID Kable Haywood T, MD   2 mg at 01/31/23 1028   haloperidol (HALDOL) tablet 5 mg  5 mg Oral Q6H PRN Kristion Holifield T, MD   5 mg at 01/23/23 1832   Or    haloperidol lactate (HALDOL) injection 5 mg  5 mg Intramuscular Q6H PRN Julanne Schlueter T, MD   5 mg at 01/01/23 2252   lisinopril (ZESTRIL) tablet 5 mg  5 mg Oral Daily Johanny Segers T, MD   5 mg at 01/31/23 [id number]   magnesium hydroxide (MILK OF MAGNESIA) suspension 30 mL  30 mL Oral Daily PRN Keegan Ducey T, MD   30 mL at 01/20/23 2133   metoprolol succinate (TOPROL-XL) 24 hr tablet 25 mg  25 mg Oral Daily Vallen Calabrese T, MD   25 mg at 01/31/23 0813   ondansetron (ZOFRAN) tablet 4 mg  4 mg Oral Q8H PRN [name], NP   4 mg at 09/06/22 2320   paliperidone (INVEGA SUSTENNA) injection 156 mg  156 mg Intramuscular Q28 days Khristy Kalan T, MD   156 mg at 01/15/23 1652   paliperidone (INVEGA) 24 hr tablet 6 mg  6 mg Oral QHS Jeneal Vogl T, MD   6 mg at 01/30/23 2109   senna-docusate (Senokot-S) tablet 2 tablet  2 tablet Oral QHS PRN [name], DO   2 tablet at 01/20/23 2133   temazepam (RESTORIL) capsule 15 mg  15 mg Oral QHS Donnah Levert T, MD   15 mg at 01/30/23 2109   ziprasidone (GEODON) injection 20 mg  20 mg Intramuscular Q12H PRN Fread Kottke, [name], MD   20 mg at 07/08/22 1453    Lab Results: No results found for this or any previous visit (from the past 48 hour(s)).  Blood Alcohol level:  Lab Results  Component Value Date   ETH <10 [id number]    Metabolic Disorder Labs: Lab Results  Component Value Date   HGBA1C 5.5 04/10/2022   MPG 111.15 04/10/2022   No results found for: "PROLACTIN" Lab Results  Component Value Date   CHOL 167 11/20/2021   TRIG 107 11/20/2021   HDL 26 (L) 11/20/2021   CHOLHDL 6.4 11/20/2021   VLDL 21 11/20/2021   LDLCALC 120 (H) 11/20/2021    Physical Findings: AIMS: Facial and Oral Movements Muscles of Facial Expression: None, normal Lips and Perioral Area: None, normal Jaw: None, normal Tongue: None, normal,Extremity Movements Upper (arms, wrists, hands, fingers): None, normal Lower (legs, knees, ankles, toes): None, normal,  Trunk Movements Neck, shoulders, hips: None, normal, Overall Severity Severity of abnormal movements (highest score from questions above): None, normal Incapacitation due to abnormal movements: None,  normal Patient's awareness of abnormal movements (rate only patient's report): No Awareness, Dental Status Current problems with teeth and/or dentures?: No Does patient usually wear dentures?: No  CIWA:    COWS:     Musculoskeletal: Strength & Muscle Tone: within normal limits Gait & Station: normal Patient leans: N/A  Psychiatric Specialty Exam:  Presentation  General Appearance:  Appropriate for Environment; Casual; Neat; Well Groomed  Eye Contact: Fair  Speech: Slow  Speech Volume: Decreased  Handedness: Left   Mood and Affect  Mood: Anxious  Affect: Congruent   Thought Process  Thought Processes: Goal Directed  Descriptions of Associations:Circumstantial  Orientation:Partial  Thought Content:Scattered  History of Schizophrenia/Schizoaffective disorder:Yes  Duration of Psychotic Symptoms:Greater than six months  Hallucinations:No data recorded Ideas of Reference:None  Suicidal Thoughts:No data recorded Homicidal Thoughts:No data recorded  Sensorium  Memory: Remote Good  Judgment: Fair  Insight: Fair   Community education officer  Concentration: Fair  Attention Span: Fair  Recall: [place] of Knowledge: Fair  Language: Fair   Psychomotor Activity  Psychomotor Activity:No data recorded  Assets  Assets:No data recorded  Sleep  Sleep:No data recorded   Physical Exam: Physical Exam Vitals reviewed.  Constitutional:      Appearance: Normal appearance.  HENT:     Head: Normocephalic and atraumatic.     Mouth/Throat:     Pharynx: Oropharynx is clear.  Eyes:     Pupils: Pupils are equal, round, and reactive to light.  Cardiovascular:     Rate and Rhythm: Normal rate and regular rhythm.  Pulmonary:     Effort: Pulmonary  effort is normal.     Breath sounds: Normal breath sounds.  Abdominal:     General: Abdomen is flat.     Palpations: Abdomen is soft.  Musculoskeletal:        General: Normal range of motion.  Skin:    General: Skin is warm and dry.  Neurological:     General: No focal deficit present.     Mental Status: He is alert. Mental status is at baseline.  Psychiatric:        Attention and Perception: He is inattentive.        Mood and Affect: Mood normal. Affect is blunt.        Speech: He is noncommunicative.    Review of Systems  Unable to perform ROS: Language   Blood pressure 112/84, pulse 100, temperature 98 F (36.7 C), temperature source Oral, resp. rate 18, height '5\' 8"'$  (1.727 m), weight 85.8 kg, SpO2 100 %. Body mass index is 28.76 kg/m.   Treatment Plan Summary: Medication management and Plan no change to medication.  Continue current treatment plan.  Focused entirely on discharge.  Alethia Berthold, MD 01/31/2023, 2:31 PM

## 2023-01-31 NOTE — BHH Counselor (Addendum)
CSW came to the unit to complete group.  Once on unit CSW was informed by Junita Push RN that a Education officer, museum from [place]. Terrilee Files was on the phone waiting to speak with CSW.  CSW spoke with Tillie Rung who reports that she was here to speak with patient.   CSW explained that patient would have to provide permission.    Patient provided permission as evidenced by saying "yeah" and giving a thumbs up when asked if he was aligned with meeting with DSS.  DSS met with patient while CSW was present.  CSW was asked initially to leave room and CSW explained that she could not.  DSS then asked for CSW to not sit at same table and CSW stood nearby.  CSW observed DSS to ask patient several questions about how he was doing, and mine MSE.  Patient did not answer questions appropriately, instead showing DSS his tattoos.  When asked by DSS if he would like to draw a picture, pt stated "[employer]".  Patient, when presented with the picture did not draw it, instead he wrote his name on the paper.  Tillie Rung reports that at this time the patient had an APS report made that has been made, she will work  with the patient for 30-45 days.  At the end of the 30-45 days she will then make recommendations for patient to continue with services or for DSS involvement to end.  Tamsen Meek [phone]  Assunta Curtis, MSW, LCSW 01/31/2023 2:28 PM

## 2023-01-31 NOTE — Group Note (Signed)
Recreation Therapy Group Note   Group Topic:Relaxation  Group Date: 01/31/2023 Start Time: 1000 End Time: 1040 Facilitators: [name], LRT, CTRS Location:  Dayroom  Group Description: PMR (Progressive Muscle Relaxation). LRT asks patients their current level of stress/anxiety from 1-10, with 10 being the highest. LRT educates patients on what PMR is and the benefits that come from it. Patients are asked to sit with their feet flat on the floor while sitting up and all the way back in their chair, if possible. LRT follows prompt that requires the patients to tense and release different muscles in their body and focus on their breathing. During session, lights are off and soft music is being played. At the end of the prompt, LRT asks patients to rank their current levels of stress/anxiety from 1-10, 10 being the highest.   Affect/Mood: Appropriate   Participation Level: Non-verbal   Participation Quality: Minimal Cues   Behavior: Alert   Speech/Thought Process: Unfocused   Insight: N/A   Judgement: Limited   Modes of Intervention: Activity and Education   Patient Response to Interventions:  Disengaged   Education Outcome:  In group clarification offered    Clinical Observations/Individualized Feedback: K[place] was minimally active in their participation of session activities and group discussion. Pt was in and out of group, however sat still with his eyes closed for about 10 minutes at the start of the session before getting up and walking out.    Plan: Continue to engage patient in RT group sessions 2-3x/week.   [name], LRT, CTRS 01/31/2023 11:06 AM

## 2023-02-01 NOTE — Group Note (Signed)
[facility] LCSW Group Therapy Note   Group Date: 02/01/2023 Start Time: 1300 End Time: 1400   Type of Therapy/Topic:  Group Therapy:  Balance in Life  Participation Level:  None   Description of Group:    This group will address the concept of balance and how it feels and looks when one is unbalanced. Patients will be encouraged to process areas in their lives that are out of balance, and identify reasons for remaining unbalanced. Facilitators will guide patients utilizing problem- solving interventions to address and correct the stressor making their life unbalanced. Understanding and applying boundaries will be explored and addressed for obtaining  and maintaining a balanced life. Patients will be encouraged to explore ways to assertively make their unbalanced needs known to significant others in their lives, using other group members and facilitator for support and feedback.  Therapeutic Goals: Patient will identify two or more emotions or situations they have that consume much of in their lives. Patient will identify signs/triggers that life has become out of balance:  Patient will identify two ways to set boundaries in order to achieve balance in their lives:  Patient will demonstrate ability to communicate their needs through discussion and/or role plays  Summary of Patient Progress: Patient was present in group.  Patient's contribution to group was "earth, wind, air, fire", patient's way of stating "Earth, Wind and Fire".  At the time we were discussing music as a coping skill.  Patient also stated "Black Sabbath" and attempted to sing later.   Therapeutic Modalities:   Cognitive Behavioral Therapy Solution-Focused Therapy Assertiveness Training   Rozann Lesches, LCSW

## 2023-02-01 NOTE — Plan of Care (Signed)
  Problem: Education: Goal: Ability to state activities that reduce stress will improve Outcome: Not Progressing   Problem: Coping: Goal: Ability to identify and develop effective coping behavior will improve Outcome: Not Progressing   Problem: Self-Concept: Goal: Ability to identify factors that promote anxiety will improve Outcome: Not Progressing Goal: Level of anxiety will decrease Outcome: Not Progressing Goal: Ability to modify response to factors that promote anxiety will improve Outcome: Not Progressing   Problem: Education: Goal: Knowledge of General Education information will improve Description: Including pain rating scale, medication(s)/side effects and non-pharmacologic comfort measures Outcome: Not Progressing   Problem: Health Behavior/Discharge Planning: Goal: Ability to manage health-related needs will improve Outcome: Not Progressing   Problem: Clinical Measurements: Goal: Ability to maintain clinical measurements within normal limits will improve Outcome: Not Progressing Goal: Will remain free from infection Outcome: Not Progressing Goal: Diagnostic test results will improve Outcome: Not Progressing Goal: Respiratory complications will improve Outcome: Not Progressing Goal: Cardiovascular complication will be avoided Outcome: Not Progressing   Problem: Activity: Goal: Risk for activity intolerance will decrease Outcome: Not Progressing   Problem: Nutrition: Goal: Adequate nutrition will be maintained Outcome: Not Progressing   Problem: Coping: Goal: Level of anxiety will decrease Outcome: Not Progressing   Problem: Elimination: Goal: Will not experience complications related to bowel motility Outcome: Not Progressing Goal: Will not experience complications related to urinary retention Outcome: Not Progressing   Problem: Pain Managment: Goal: General experience of comfort will improve Outcome: Not Progressing   Problem: Safety: Goal:  Ability to remain free from injury will improve Outcome: Not Progressing   Problem: Skin Integrity: Goal: Risk for impaired skin integrity will decrease Outcome: Not Progressing   Problem: Activity: Goal: Interest or engagement in leisure activities will improve Outcome: Not Progressing   Problem: Coping: Goal: Coping ability will improve Outcome: Not Progressing   Problem: Education: Goal: Will be free of psychotic symptoms Outcome: Not Progressing

## 2023-02-01 NOTE — Progress Notes (Signed)
[facility] MD Progress Note  02/01/2023 11:21 AM Adam [name]  MRN:  [id number] Subjective: Follow-up patient was [place] and stroke.  No change to behavior.  Seems in good spirits today. 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)  Total Time spent with patient: 20 minutes  Past Psychiatric History: Past history of schizophrenia and stroke with aphasia  Past Medical History:  Past Medical History:  Diagnosis Date   Myocardial infarction ([place])    Schizophrenia ([place])    Stroke ([place])     Past Surgical History:  Procedure Laterality Date   NO PAST SURGERIES     Family History:  Family History  Problem Relation Age of Onset   Hypertension Mother    Family Psychiatric  History: See previous Social History:  Social History   Substance and Sexual Activity  Alcohol Use Not Currently     Social History   Substance and Sexual Activity  Drug Use Not Currently    Social History   Socioeconomic History   Marital status: Single    Spouse name: Not on file   Number of children: Not on file   Years of education: Not on file   Highest education level: Not on file  Occupational History   Not on file  Tobacco Use   Smoking status: Never    Passive exposure: Never   Smokeless tobacco: Never  Vaping Use   Vaping Use: Unknown  Substance and Sexual Activity   Alcohol use: Not Currently   Drug use: Not Currently   Sexual activity: Not Currently  Other Topics Concern   Not on file  Social History Narrative   Not on file   Social Determinants of Health    Financial Resource Strain: Not on file  Food Insecurity: Not on file  Transportation Needs: Not on file  Physical Activity: Not on file  Stress: Not on file  Social Connections: Not on file   Additional Social History:  Specify valuables returned:  (none)                      Sleep: Fair  Appetite:  Fair  Current Medications: Current Facility-Administered Medications  Medication Dose Route Frequency Provider Last Rate Last Admin   acetaminophen (TYLENOL) tablet 650 mg  650 mg Oral Q6H PRN Sadiya Durand T, MD   650 mg at 01/28/23 0815   alum & mag hydroxide-simeth (MAALOX/MYLANTA) 200-200-20 MG/5ML suspension 30 mL  30 mL Oral Q4H PRN Bryann Mcnealy T, MD   30 mL at 04/26/22 [id number]   aspirin EC tablet 81 mg  81 mg Oral Daily Lawson Mahone T, MD   81 mg at 02/01/23 1010   atorvastatin (LIPITOR) tablet 20 mg  20 mg Oral Daily Chandlor Noecker T, MD   20 mg at 02/01/23 1010   atropine 1 % ophthalmic solution 1 drop  1 drop Sublingual QID [name], MD   1 drop at 02/01/23 1011   benztropine (COGENTIN) tablet 0.5 mg  0.5 mg Oral BID Elinda Bunten T, MD   0.5 mg at 02/01/23 1011   clonazePAM (KLONOPIN) tablet 1  mg  1 mg Oral TID PRN He, [name], MD   1 mg at 01/28/23 0813   cloZAPine (CLOZARIL) tablet 300 mg  300 mg Oral QHS Koi Zangara T, MD   300 mg at 01/31/23 2108   diphenhydrAMINE (BENADRYL) capsule 50 mg  50 mg Oral Q6H PRN Alexandria Current T, MD   50 mg at 01/28/23 2105   Or   diphenhydrAMINE (BENADRYL) injection 50 mg  50 mg Intramuscular Q6H PRN Irving Lubbers T, MD   50 mg at 01/01/23 2252   feeding supplement (ENSURE ENLIVE / ENSURE PLUS) liquid 237 mL  237 mL Oral BID BM Tierre Gerard T, MD   237 mL at 02/01/23 1013   gabapentin (NEURONTIN) capsule 300 mg  300 mg Oral TID Beaulah Romanek T, MD   300 mg at 02/01/23 1013   haloperidol (HALDOL) tablet 2 mg  2 mg Oral TID Angeleen Horney T, MD   2 mg at 02/01/23 1013   haloperidol (HALDOL) tablet 5 mg  5 mg Oral Q6H PRN Merced Brougham,  Jeniel Slauson T, MD   5 mg at 01/23/23 [id number]   Or   haloperidol lactate (HALDOL) injection 5 mg  5 mg Intramuscular Q6H PRN Breon Rehm T, MD   5 mg at 01/01/23 2252   lisinopril (ZESTRIL) tablet 5 mg  5 mg Oral Daily Ronnette Rump T, MD   5 mg at 02/01/23 1013   magnesium hydroxide (MILK OF MAGNESIA) suspension 30 mL  30 mL Oral Daily PRN Diarra Ceja T, MD   30 mL at 01/20/23 2133   metoprolol succinate (TOPROL-XL) 24 hr tablet 25 mg  25 mg Oral Daily Manoj Enriquez T, MD   25 mg at 02/01/23 1011   ondansetron (ZOFRAN) tablet 4 mg  4 mg Oral Q8H PRN [name], NP   4 mg at 09/06/22 2320   paliperidone (INVEGA SUSTENNA) injection 156 mg  156 mg Intramuscular Q28 days Kalani Sthilaire T, MD   156 mg at 01/15/23 1652   paliperidone (INVEGA) 24 hr tablet 6 mg  6 mg Oral QHS Jaeceon Michelin T, MD   6 mg at 01/31/23 2108   senna-docusate (Senokot-S) tablet 2 tablet  2 tablet Oral QHS PRN [name], DO   2 tablet at 01/20/23 2133   temazepam (RESTORIL) capsule 15 mg  15 mg Oral QHS Chalet Kerwin T, MD   15 mg at 01/31/23 2108   ziprasidone (GEODON) injection 20 mg  20 mg Intramuscular Q12H PRN Weslyn Holsonback, [name], MD   20 mg at 07/08/22 1453    Lab Results: No results found for this or any previous visit (from the past 93 hour(s)).  Blood Alcohol level:  Lab Results  Component Value Date   ETH <10 [id number]    Metabolic Disorder Labs: Lab Results  Component Value Date   HGBA1C 5.5 04/10/2022   MPG 111.15 04/10/2022   No results found for: "PROLACTIN" Lab Results  Component Value Date   CHOL 167 11/20/2021   TRIG 107 11/20/2021   HDL 26 (L) 11/20/2021   CHOLHDL 6.4 11/20/2021   VLDL 21 11/20/2021   LDLCALC 120 (H) 11/20/2021    Physical Findings: AIMS: Facial and Oral Movements Muscles of Facial Expression: None, normal Lips and Perioral Area: None, normal Jaw: None, normal Tongue: None, normal,Extremity Movements Upper (arms, wrists, hands, fingers): None, normal Lower  (legs, knees, ankles, toes): None, normal, Trunk Movements Neck, shoulders, hips: None, normal, Overall Severity Severity of abnormal movements (highest  score from questions above): None, normal Incapacitation due to abnormal movements: None, normal Patient's awareness of abnormal movements (rate only patient's report): No Awareness, Dental Status Current problems with teeth and/or dentures?: No Does patient usually wear dentures?: No  CIWA:    COWS:     Musculoskeletal: Strength & Muscle Tone: within normal limits Gait & Station: normal Patient leans: N/A  Psychiatric Specialty Exam:  Presentation  General Appearance:  Appropriate for Environment; Casual; Neat; Well Groomed  Eye Contact: Fair  Speech: Slow  Speech Volume: Decreased  Handedness: Left   Mood and Affect  Mood: Anxious  Affect: Congruent   Thought Process  Thought Processes: Goal Directed  Descriptions of Associations:Circumstantial  Orientation:Partial  Thought Content:Scattered  History of Schizophrenia/Schizoaffective disorder:Yes  Duration of Psychotic Symptoms:Greater than six months  Hallucinations:No data recorded Ideas of Reference:None  Suicidal Thoughts:No data recorded Homicidal Thoughts:No data recorded  Sensorium  Memory: Remote Good  Judgment: Fair  Insight: Fair   Community education officer  Concentration: Fair  Attention Span: Fair  Recall: [employer] of Knowledge: Fair  Language: Fair   Psychomotor Activity  Psychomotor Activity:No data recorded  Assets  Assets:No data recorded  Sleep  Sleep:No data recorded   Physical Exam: Physical Exam Vitals and nursing note reviewed.  Constitutional:      Appearance: Normal appearance.  HENT:     Head: Normocephalic and atraumatic.     Mouth/Throat:     Pharynx: Oropharynx is clear.  Eyes:     Pupils: Pupils are equal, round, and reactive to light.  Cardiovascular:     Rate and Rhythm: Normal  rate and regular rhythm.  Pulmonary:     Effort: Pulmonary effort is normal.     Breath sounds: Normal breath sounds.  Abdominal:     General: Abdomen is flat.     Palpations: Abdomen is soft.  Musculoskeletal:        General: Normal range of motion.  Skin:    General: Skin is warm and dry.  Neurological:     General: No focal deficit present.     Mental Status: He is alert. Mental status is at baseline.  Psychiatric:        Attention and Perception: He is inattentive.        Mood and Affect: Mood normal. Affect is blunt.        Speech: He is noncommunicative.    Review of Systems  Unable to perform ROS: Language   Blood pressure 125/81, pulse (!) 105, temperature 98 F (36.7 C), temperature source Oral, resp. rate 18, height '5\' 8"'$  (1.727 m), weight 85.8 kg, SpO2 100 %. Body mass index is 28.76 kg/m.   Treatment Plan Summary: Plan no indication for any change to current treatment plan.  Supportive encouragement provided.  Team continues to focus entirely on discharge planning  Alethia Berthold, MD 02/01/2023, 11:21 AM

## 2023-02-01 NOTE — Progress Notes (Signed)
D- Patient alert and oriented. Affect animated/mood euthymic. Denies SI/ HI/ AVH. He denies pain.  A- Scheduled medications administered to patient, per MD orders. Support and encouragement provided.  Routine safety checks conducted every 15 minutes.  Patient informed to notify staff with problems or concerns. R- No adverse drug reactions noted. Patient compliant with medications and treatment plan. Patient receptive and cooperative. Patient interacts well with others on the unit.  Patient contracts for safety and remains safe on the unit at this time.

## 2023-02-01 NOTE — Group Note (Signed)
Recreation Therapy Group Note   Group Topic:Coping Skills  Group Date: 02/01/2023 Start Time: 1000 End Time: 1045 Facilitators: [name], LRT, CTRS Location:  Craft Room  Group Description: Mind Map.  Patient was provided a blank template of a diagram with 32 blank boxes in a tiered system, branching from the center (similar to a bubble chart). LRT directed patients to label the middle of the diagram "Coping Skills". LRT and patients then came up with 8 different coping skills as examples. Pt were directed to record their coping skills in the 2nd tier boxes closest to the center.  Patients would then share their coping skills with the group as LRT wrote them out. LRT gave a handout of 100 different coping skills at the end of group.   Affect/Mood: N/A   Participation Level: Did not attend    Clinical Observations/Individualized Feedback: Adam [name] did not attend group due to resting in his room.  Plan: Continue to engage patient in RT group sessions 2-3x/week.   [name], LRT, CTRS 02/01/2023 11:04 AM

## 2023-02-02 NOTE — BHH Group Notes (Signed)
[place] Group Notes:  (Nursing/MHT/Case Management/Adjunct)  Date:  02/02/2023  Time:  2:33 PM  Type of Therapy:   NA/AA  Participation Level:  Did Not Attend    Adela Lank [facility] 02/02/2023, 2:33 PM

## 2023-02-02 NOTE — Progress Notes (Signed)
D: Patient denies SI/HI/AVH. Patient is angry and uncooperative with treatment plans, he was agitated, angry with peers that he has to share dayroom, he was noted anxious and restless and not interacting appropriately with peers and staff.  A: Patient was offered support and encouragement, he was was given scheduled medications, and was encouraged to attend groups. 15 minutes safety checks were done for safety.  R: Patients attended group, he was angry and inappropriate he did not interacts well with peers and staff. Patient is complaint with medication regimen.15 minutes safety checks maintained will continue to monitor.

## 2023-02-02 NOTE — Progress Notes (Signed)
D- Patient alert and oriented x 2-3. Affect animated/mood labile. Denies SI/HI/ AVH. He denies pain. A- Scheduled medications administered to patient, per MD orders. Support and encouragement provided.  Routine safety checks conducted every 15 minutes.  Patient informed to notify staff with problems or concerns. R- No adverse drug reactions noted. Patient compliant with medications and treatment plan. Patient receptive and cooperative. Patient interacts well with others on the unit.  Patient contracts for safety and remains safe on the unit at this time.

## 2023-02-02 NOTE — Plan of Care (Signed)
  Problem: Education: Goal: Ability to state activities that reduce stress will improve Outcome: Not Progressing

## 2023-02-02 NOTE — Progress Notes (Signed)
[facility] MD Progress Note  02/02/2023 1:55 PM Adam [name]  MRN:  [id number] Subjective: Follow-up patient with schizophrenia.  Behavior stable and calm.  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)  Total Time spent with patient: 30 minutes  Past Psychiatric History: See previous  Past Medical History:  Past Medical History:  Diagnosis Date   Myocardial infarction ([place])    Schizophrenia ([place])    Stroke ([place])     Past Surgical History:  Procedure Laterality Date   NO PAST SURGERIES     Family History:  Family History  Problem Relation Age of Onset   Hypertension Mother    Family Psychiatric  History: See previous Social History:  Social History   Substance and Sexual Activity  Alcohol Use Not Currently     Social History   Substance and Sexual Activity  Drug Use Not Currently    Social History   Socioeconomic History   Marital status: Single    Spouse name: Not on file   Number of children: Not on file   Years of education: Not on file   Highest education level: Not on file  Occupational History   Not on file  Tobacco Use   Smoking status: Never    Passive exposure: Never   Smokeless tobacco: Never  Vaping Use   Vaping Use: Unknown  Substance and Sexual Activity   Alcohol use: Not Currently   Drug use: Not Currently   Sexual activity: Not Currently  Other Topics Concern   Not on file  Social History Narrative   Not on file   Social Determinants of Health    Financial Resource Strain: Not on file  Food Insecurity: Not on file  Transportation Needs: Not on file  Physical Activity: Not on file  Stress: Not on file  Social Connections: Not on file   Additional Social History:  Specify valuables returned:  (none)                      Sleep: Fair  Appetite:  Fair  Current Medications: Current Facility-Administered Medications  Medication Dose Route Frequency Provider Last Rate Last Admin   acetaminophen (TYLENOL) tablet 650 mg  650 mg Oral Q6H PRN Janice Seales T, MD   650 mg at 01/28/23 0815   alum & mag hydroxide-simeth (MAALOX/MYLANTA) 200-200-20 MG/5ML suspension 30 mL  30 mL Oral Q4H PRN Shawnay Bramel T, MD   30 mL at 04/26/22 [id number]   aspirin EC tablet 81 mg  81 mg Oral Daily Kiondre Grenz, [name], MD   81 mg at 02/02/23 [id number]   atorvastatin (LIPITOR) tablet 20 mg  20 mg Oral Daily Kolbee Stallman T, MD   20 mg at 02/02/23 0908   atropine 1 % ophthalmic solution 1 drop  1 drop Sublingual QID [name], MD   1 drop at 02/02/23 1154   benztropine (COGENTIN) tablet 0.5 mg  0.5 mg Oral BID Lundon Verdejo T, MD   0.5 mg at 02/02/23 0907   clonazePAM (KLONOPIN) tablet 1 mg  1  mg Oral TID PRN He, [name], MD   1 mg at 01/28/23 0813   cloZAPine (CLOZARIL) tablet 300 mg  300 mg Oral QHS Kaleena Corrow T, MD   300 mg at 02/01/23 2214   diphenhydrAMINE (BENADRYL) capsule 50 mg  50 mg Oral Q6H PRN Gene Glazebrook T, MD   50 mg at 01/28/23 2105   Or   diphenhydrAMINE (BENADRYL) injection 50 mg  50 mg Intramuscular Q6H PRN Joee Iovine T, MD   50 mg at 01/01/23 2252   feeding supplement (ENSURE ENLIVE / ENSURE PLUS) liquid 237 mL  237 mL Oral BID BM Jill Ruppe T, MD   237 mL at 02/02/23 0908   gabapentin (NEURONTIN) capsule 300 mg  300 mg Oral TID Bird Swetz T, MD   300 mg at 02/02/23 0908   haloperidol (HALDOL) tablet 2 mg  2 mg Oral TID Maston Wight, [name], MD   2 mg at 02/02/23 [id number]   haloperidol (HALDOL) tablet 5 mg  5 mg Oral Q6H PRN Linnaea Ahn,  Jadee Golebiewski T, MD   5 mg at 01/23/23 [id number]   Or   haloperidol lactate (HALDOL) injection 5 mg  5 mg Intramuscular Q6H PRN Breiana Stratmann T, MD   5 mg at 01/01/23 2252   lisinopril (ZESTRIL) tablet 5 mg  5 mg Oral Daily Twilla Khouri T, MD   5 mg at 02/02/23 [id number]   magnesium hydroxide (MILK OF MAGNESIA) suspension 30 mL  30 mL Oral Daily PRN Katricia Prehn T, MD   30 mL at 01/20/23 2133   metoprolol succinate (TOPROL-XL) 24 hr tablet 25 mg  25 mg Oral Daily Joeziah Voit T, MD   25 mg at 02/02/23 0908   ondansetron (ZOFRAN) tablet 4 mg  4 mg Oral Q8H PRN [name], NP   4 mg at 09/06/22 2320   paliperidone (INVEGA SUSTENNA) injection 156 mg  156 mg Intramuscular Q28 days Avaeh Ewer T, MD   156 mg at 01/15/23 1652   paliperidone (INVEGA) 24 hr tablet 6 mg  6 mg Oral QHS Anagha Loseke T, MD   6 mg at 02/01/23 2214   senna-docusate (Senokot-S) tablet 2 tablet  2 tablet Oral QHS PRN [name], DO   2 tablet at 01/20/23 2133   temazepam (RESTORIL) capsule 15 mg  15 mg Oral QHS Shanielle Correll T, MD   15 mg at 02/01/23 2214   ziprasidone (GEODON) injection 20 mg  20 mg Intramuscular Q12H PRN Gwenlyn Hottinger, [name], MD   20 mg at 07/08/22 1453    Lab Results: No results found for this or any previous visit (from the past 48 hour(s)).  Blood Alcohol level:  Lab Results  Component Value Date   ETH <10 [id number]    Metabolic Disorder Labs: Lab Results  Component Value Date   HGBA1C 5.5 04/10/2022   MPG 111.15 04/10/2022   No results found for: "PROLACTIN" Lab Results  Component Value Date   CHOL 167 11/20/2021   TRIG 107 11/20/2021   HDL 26 (L) 11/20/2021   CHOLHDL 6.4 11/20/2021   VLDL 21 11/20/2021   LDLCALC 120 (H) 11/20/2021    Physical Findings: AIMS: Facial and Oral Movements Muscles of Facial Expression: None, normal Lips and Perioral Area: None, normal Jaw: None, normal Tongue: None, normal,Extremity Movements Upper (arms, wrists, hands, fingers): None, normal Lower  (legs, knees, ankles, toes): None, normal, Trunk Movements Neck, shoulders, hips: None, normal, Overall Severity Severity of abnormal movements (highest score from questions  above): None, normal Incapacitation due to abnormal movements: None, normal Patient's awareness of abnormal movements (rate only patient's report): No Awareness, Dental Status Current problems with teeth and/or dentures?: No Does patient usually wear dentures?: No  CIWA:    COWS:     Musculoskeletal: Strength & Muscle Tone: within normal limits Gait & Station: normal Patient leans: N/A  Psychiatric Specialty Exam:  Presentation  General Appearance:  Appropriate for Environment; Casual; Neat; Well Groomed  Eye Contact: Fair  Speech: Slow  Speech Volume: Decreased  Handedness: Left   Mood and Affect  Mood: Anxious  Affect: Congruent   Thought Process  Thought Processes: Goal Directed  Descriptions of Associations:Circumstantial  Orientation:Partial  Thought Content:Scattered  History of Schizophrenia/Schizoaffective disorder:Yes  Duration of Psychotic Symptoms:Greater than six months  Hallucinations:No data recorded Ideas of Reference:None  Suicidal Thoughts:No data recorded Homicidal Thoughts:No data recorded  Sensorium  Memory: Remote Good  Judgment: Fair  Insight: Fair   Community education officer  Concentration: Fair  Attention Span: Fair  Recall: [place] of Knowledge: Fair  Language: Fair   Psychomotor Activity  Psychomotor Activity:No data recorded  Assets  Assets:No data recorded  Sleep  Sleep:No data recorded   Physical Exam: Physical Exam Vitals reviewed.  Constitutional:      Appearance: Normal appearance.  HENT:     Head: Normocephalic and atraumatic.     Mouth/Throat:     Pharynx: Oropharynx is clear.  Eyes:     Pupils: Pupils are equal, round, and reactive to light.  Cardiovascular:     Rate and Rhythm: Normal rate and regular  rhythm.  Pulmonary:     Effort: Pulmonary effort is normal.     Breath sounds: Normal breath sounds.  Abdominal:     General: Abdomen is flat.     Palpations: Abdomen is soft.  Musculoskeletal:        General: Normal range of motion.  Skin:    General: Skin is warm and dry.  Neurological:     General: No focal deficit present.     Mental Status: He is alert. Mental status is at baseline.  Psychiatric:        Attention and Perception: He is inattentive.        Mood and Affect: Mood normal. Affect is blunt.        Speech: He is noncommunicative.    Review of Systems  Unable to perform ROS: Language  Constitutional: Negative.   HENT: Negative.    Eyes: Negative.   Respiratory: Negative.    Cardiovascular: Negative.   Gastrointestinal: Negative.   Musculoskeletal: Negative.   Skin: Negative.   Neurological: Negative.    Blood pressure 121/73, pulse (!) 105, temperature 99 F (37.2 C), temperature source Oral, resp. rate 16, height '5\' 8"'$  (1.727 m), weight 85.8 kg, SpO2 100 %. Body mass index is 28.76 kg/m.   Treatment Plan Summary: Medication management and Plan no change to medication management.  Continue support and encouragement.  Mainly still looking at placement.  Alethia Berthold, MD 02/02/2023, 1:55 PM

## 2023-02-03 NOTE — BHH Group Notes (Signed)
[place] Group Notes:  (Nursing/MHT/Case Management/Adjunct)  Date:  02/03/2023  Time:  5:00 PM  Type of Therapy:  Psychoeducational Skills  Participation Level:  Active  Participation Quality:  Appropriate  Affect:  Appropriate  Cognitive:  Appropriate  Insight:  Appropriate  Engagement in Group:  Engaged  Modes of Intervention:  Activity  Summary of Progress/Problems:  Adam [name] 02/03/2023, 5:00 PM

## 2023-02-03 NOTE — BHH Group Notes (Signed)
[place] Group Notes:  (Nursing/MHT/Case Management/Adjunct)  Date:  02/03/2023  Time:  4:59 PM  Type of Therapy:  Psychoeducational Skills  Participation Level:  Active  Participation Quality:  Appropriate  Affect:  Appropriate  Cognitive:  Appropriate  Insight:  Appropriate  Engagement in Group:  Engaged  Modes of Intervention:  Activity  Summary of Progress/Problems:  Adela Lank [facility] 02/03/2023, 4:59 PM

## 2023-02-03 NOTE — NC FL2 (Signed)
[place] LEVEL OF CARE FORM     IDENTIFICATION  Patient Name: Adam [name] Birthdate: December 27, 1960 Sex: male Admission Date (Current Location): 03/19/2022  [facility] and [place] Number:  Engineering geologist and Address:  [facility], [address] [place], [place] [address]      Provider Number: [id number]  Attending Physician Name and Address:  Gonzella Lex, MD  Relative Name and Phone Number:  [place] Donaldson, mother/POA, [phone]    Current Level of Care: Hospital Recommended Level of Care: [place], [place], Other (Comment) (Group Home) Prior Approval Number:    Date Approved/Denied:   PASRR Number:    Discharge Plan: Other (Comment) (Assisted Living, Group Home, Family Care Home)    Current Diagnoses: Patient Active Problem List   Diagnosis Date Noted   Aphasia as late effect of cerebrovascular accident 01/30/2023   Cognitive dysfunction as late effect of cerebrovascular accident (CVA) 01/30/2023   HTN (hypertension) 10/05/2022   HLD (hyperlipidemia) 10/05/2022   CAD (coronary artery disease) 99991111   Chronic systolic CHF (congestive heart failure) ([place]) 10/05/2022   Anxiety 10/05/2022   Stroke ([place]) 11/27/2021   NSTEMI (non-ST elevated myocardial infarction) ([place]) 11/21/2021   DNR (do not resuscitate)/DNI(Do Not Intubate) 11/20/2021   Schizophrenia, chronic condition with acute exacerbation ([place]) 07/20/2021    Orientation RESPIRATION BLADDER Height & Weight     Self, Place, Time  Normal Continent Weight: 189 lb 2.5 oz (85.8 kg) Height:  '5\' 8"'$  (172.7 cm)  BEHAVIORAL SYMPTOMS/MOOD NEUROLOGICAL BOWEL NUTRITION STATUS  Other (Comment) (Cooperative)  (within normal limits) Continent Diet (normal)  AMBULATORY STATUS COMMUNICATION OF NEEDS Skin   Independent Verbally (Patient is able to speak, however, has limitations due to expressive aphasia from stroke.  Patient uses both words and gestures  to communicate.) Normal                       Personal Care Assistance Level of Assistance   (none) Bathing Assistance: Independent Feeding assistance: Independent Dressing Assistance: Independent Total Care Assistance: Independent   Functional Limitations Info  Speech (Aphasia) Sight Info: Impaired (wears glasses) Hearing Info: Adequate Speech Info: Impaired (Expressive aphasia)    SPECIAL CARE FACTORS FREQUENCY   (NA)                    Contractures Contractures Info: Not present    Additional Factors Info  Code Status, Allergies Code Status Info: Full Allergies Info: Morphine, NSAIDS Psychotropic Info: Cogentin, Clozaril, Neurontin, Haldol, Invega Sustenna, Invega, Restoril         Current Medications (02/03/2023):  This is the current hospital active medication list Current Facility-Administered Medications  Medication Dose Route Frequency Provider Last Rate Last Admin   acetaminophen (TYLENOL) tablet 650 mg  650 mg Oral Q6H PRN [name], [name], MD   650 mg at 02/03/23 0906   alum & mag hydroxide-simeth (MAALOX/MYLANTA) 200-200-20 MG/5ML suspension 30 mL  30 mL Oral Q4H PRN [name], [name], MD   30 mL at 04/26/22 [id number]   aspirin EC tablet 81 mg  81 mg Oral Daily [name], [name], MD   81 mg at 02/03/23 [id number]   atorvastatin (LIPITOR) tablet 20 mg  20 mg Oral Daily [name], [name], MD   20 mg at 02/03/23 0808   atropine 1 % ophthalmic solution 1 drop  1 drop Sublingual QID [name], MD   1 drop at 02/03/23 1111  benztropine (COGENTIN) tablet 0.5 mg  0.5 mg Oral BID [name], [name], MD   0.5 mg at 02/03/23 [id number]   clonazePAM (KLONOPIN) tablet 1 mg  1 mg Oral TID PRN He, [name], MD   1 mg at 02/02/23 2125   cloZAPine (CLOZARIL) tablet 300 mg  300 mg Oral QHS [name], [name], MD   300 mg at 02/02/23 2125   diphenhydrAMINE (BENADRYL) capsule 50 mg  50 mg Oral Q6H PRN [name], [name], MD   50 mg at 01/28/23 2105   Or   diphenhydrAMINE (BENADRYL) injection 50 mg  50 mg  Intramuscular Q6H PRN [name], [name], MD   50 mg at 01/01/23 2252   feeding supplement (ENSURE ENLIVE / ENSURE PLUS) liquid 237 mL  237 mL Oral BID BM [name], [name], MD   237 mL at 02/03/23 0908   gabapentin (NEURONTIN) capsule 300 mg  300 mg Oral TID [name], [name], MD   300 mg at 02/03/23 [id number]   haloperidol (HALDOL) tablet 2 mg  2 mg Oral TID [name], [name], MD   2 mg at 02/03/23 [id number]   haloperidol (HALDOL) tablet 5 mg  5 mg Oral Q6H PRN [name], [name], MD   5 mg at 01/23/23 1832   Or   haloperidol lactate (HALDOL) injection 5 mg  5 mg Intramuscular Q6H PRN [name], [name], MD   5 mg at 01/01/23 2252   lisinopril (ZESTRIL) tablet 5 mg  5 mg Oral Daily [name], [name], MD   5 mg at 02/03/23 [id number]   magnesium hydroxide (MILK OF MAGNESIA) suspension 30 mL  30 mL Oral Daily PRN [name], [name], MD   30 mL at 01/20/23 2133   metoprolol succinate (TOPROL-XL) 24 hr tablet 25 mg  25 mg Oral Daily [name], [name], MD   25 mg at 02/03/23 0808   ondansetron (ZOFRAN) tablet 4 mg  4 mg Oral Q8H PRN [name], NP   4 mg at 09/06/22 2320   paliperidone (INVEGA SUSTENNA) injection 156 mg  156 mg Intramuscular Q28 days [name], [name], MD   156 mg at 01/15/23 1652   paliperidone (INVEGA) 24 hr tablet 6 mg  6 mg Oral QHS [name], [name], MD   6 mg at 02/02/23 2124   senna-docusate (Senokot-S) tablet 2 tablet  2 tablet Oral QHS PRN [name], DO   2 tablet at 01/20/23 2133   temazepam (RESTORIL) capsule 15 mg  15 mg Oral QHS [name], [name], MD   15 mg at 02/02/23 2125   ziprasidone (GEODON) injection 20 mg  20 mg Intramuscular Q12H PRN [name], [name], MD   20 mg at 07/08/22 1453     Discharge Medications: Please see discharge summary for a list of discharge medications.  Relevant Imaging Results:  Relevant Lab Results:   Additional Information SS#: [id number]  Rozann Lesches, LCSW

## 2023-02-03 NOTE — Progress Notes (Signed)
D : Patient denies SI/HI/AVH.Patient is receptive to staff, affect is bright upon approach he is interacting appropriately with peers and staff. Patient's thoughts are disorganized and speech is tangential.  A: Patient was offered support and encouragement, he was was given scheduled medications, and was encouraged to attend groups. 15 minutes safety checks were done for safety.  R: Patients attended group, he appropriate he did interacts well with peers and staff. Patient is complaint with medication regimen.15 minutes safety checks maintained will continue to monitor.

## 2023-02-03 NOTE — Progress Notes (Signed)
[facility] MD Progress Note  02/03/2023 12:06 PM Adam [name]  MRN:  [id number] Subjective: Follow-up patient with schizophrenia.  Patient seen and chart reviewed.  No complaints.  No change to behavior.  No worsening behavior issues or concerns from nursing. 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)  Total Time spent with patient: 30 minutes  Past Psychiatric History: Past history of schizophrenia  Past Medical History:  Past Medical History:  Diagnosis Date   Myocardial infarction ([place])    Schizophrenia ([place])    Stroke ([place])     Past Surgical History:  Procedure Laterality Date   NO PAST SURGERIES     Family History:  Family History  Problem Relation Age of Onset   Hypertension Mother    Family Psychiatric  History: See previous Social History:  Social History   Substance and Sexual Activity  Alcohol Use Not Currently     Social History   Substance and Sexual Activity  Drug Use Not Currently    Social History   Socioeconomic History   Marital status: Single    Spouse name: Not on file   Number of children: Not on file   Years of education: Not on file   Highest education level: Not on file  Occupational History   Not on file  Tobacco Use   Smoking status: Never    Passive exposure: Never   Smokeless tobacco: Never  Vaping Use   Vaping Use: Unknown  Substance and Sexual Activity   Alcohol use: Not Currently   Drug use: Not Currently   Sexual activity: Not Currently  Other Topics Concern   Not on file  Social History Narrative    Not on file   Social Determinants of Health   Financial Resource Strain: Not on file  Food Insecurity: Not on file  Transportation Needs: Not on file  Physical Activity: Not on file  Stress: Not on file  Social Connections: Not on file   Additional Social History:  Specify valuables returned:  (none)                      Sleep: Fair  Appetite:  Fair  Current Medications: Current Facility-Administered Medications  Medication Dose Route Frequency Provider Last Rate Last Admin   acetaminophen (TYLENOL) tablet 650 mg  650 mg Oral Q6H PRN Tema Alire T, MD   650 mg at 02/03/23 0906   alum & mag hydroxide-simeth (MAALOX/MYLANTA) 200-200-20 MG/5ML suspension 30 mL  30 mL Oral Q4H PRN Kerline Trahan T, MD   30 mL at 04/26/22 [id number]   aspirin EC tablet 81 mg  81 mg Oral Daily Ailynn Gow, [name], MD   81 mg at 02/03/23 [id number]   atorvastatin (LIPITOR) tablet 20 mg  20 mg Oral Daily Chamya Hunton T, MD   20 mg at 02/03/23 0808   atropine 1 % ophthalmic solution 1 drop  1 drop Sublingual QID [name], MD   1 drop at 02/03/23 1111   benztropine (COGENTIN) tablet 0.5 mg  0.5 mg Oral BID Bettejane Leavens, [name], MD   0.5 mg at 02/03/23 [phone]  clonazePAM (KLONOPIN) tablet 1 mg  1 mg Oral TID PRN He, [name], MD   1 mg at 02/02/23 2125   cloZAPine (CLOZARIL) tablet 300 mg  300 mg Oral QHS Ramanda Paules T, MD   300 mg at 02/02/23 2125   diphenhydrAMINE (BENADRYL) capsule 50 mg  50 mg Oral Q6H PRN Hania Cerone T, MD   50 mg at 01/28/23 2105   Or   diphenhydrAMINE (BENADRYL) injection 50 mg  50 mg Intramuscular Q6H PRN Adelayde Minney T, MD   50 mg at 01/01/23 2252   feeding supplement (ENSURE ENLIVE / ENSURE PLUS) liquid 237 mL  237 mL Oral BID BM Ryson Bacha T, MD   237 mL at 02/03/23 0908   gabapentin (NEURONTIN) capsule 300 mg  300 mg Oral TID Nattaly Yebra T, MD   300 mg at 02/03/23 [id number]   haloperidol (HALDOL) tablet 2 mg  2 mg Oral TID Kush Farabee T, MD   2 mg at 02/03/23 [id number]   haloperidol  (HALDOL) tablet 5 mg  5 mg Oral Q6H PRN Jemya Depierro T, MD   5 mg at 01/23/23 1832   Or   haloperidol lactate (HALDOL) injection 5 mg  5 mg Intramuscular Q6H PRN Athalee Esterline T, MD   5 mg at 01/01/23 2252   lisinopril (ZESTRIL) tablet 5 mg  5 mg Oral Daily Niambi Smoak T, MD   5 mg at 02/03/23 [id number]   magnesium hydroxide (MILK OF MAGNESIA) suspension 30 mL  30 mL Oral Daily PRN Omolola Mittman T, MD   30 mL at 01/20/23 2133   metoprolol succinate (TOPROL-XL) 24 hr tablet 25 mg  25 mg Oral Daily Kvon Mcilhenny T, MD   25 mg at 02/03/23 0808   ondansetron (ZOFRAN) tablet 4 mg  4 mg Oral Q8H PRN [name], NP   4 mg at 09/06/22 2320   paliperidone (INVEGA SUSTENNA) injection 156 mg  156 mg Intramuscular Q28 days Leveon Pelzer T, MD   156 mg at 01/15/23 1652   paliperidone (INVEGA) 24 hr tablet 6 mg  6 mg Oral QHS Nykayla Marcelli T, MD   6 mg at 02/02/23 2124   senna-docusate (Senokot-S) tablet 2 tablet  2 tablet Oral QHS PRN [name], DO   2 tablet at 01/20/23 2133   temazepam (RESTORIL) capsule 15 mg  15 mg Oral QHS Delcia Spitzley T, MD   15 mg at 02/02/23 2125   ziprasidone (GEODON) injection 20 mg  20 mg Intramuscular Q12H PRN Denali Becvar, [name], MD   20 mg at 07/08/22 1453    Lab Results: No results found for this or any previous visit (from the past 48 hour(s)).  Blood Alcohol level:  Lab Results  Component Value Date   ETH <10 [id number]    Metabolic Disorder Labs: Lab Results  Component Value Date   HGBA1C 5.5 04/10/2022   MPG 111.15 04/10/2022   No results found for: "PROLACTIN" Lab Results  Component Value Date   CHOL 167 11/20/2021   TRIG 107 11/20/2021   HDL 26 (L) 11/20/2021   CHOLHDL 6.4 11/20/2021   VLDL 21 11/20/2021   LDLCALC 120 (H) 11/20/2021    Physical Findings: AIMS: Facial and Oral Movements Muscles of Facial Expression: None, normal Lips and Perioral Area: None, normal Jaw: None, normal Tongue: None, normal,Extremity Movements Upper  (arms, wrists, hands, fingers): None, normal Lower (legs, knees, ankles, toes): None, normal, Trunk Movements Neck, shoulders, hips: None, normal, Overall Severity Severity  of abnormal movements (highest score from questions above): None, normal Incapacitation due to abnormal movements: None, normal Patient's awareness of abnormal movements (rate only patient's report): No Awareness, Dental Status Current problems with teeth and/or dentures?: No Does patient usually wear dentures?: No  CIWA:    COWS:     Musculoskeletal: Strength & Muscle Tone: within normal limits Gait & Station: normal Patient leans: N/A  Psychiatric Specialty Exam:  Presentation  General Appearance:  Appropriate for Environment; Casual; Neat; Well Groomed  Eye Contact: Fair  Speech: Slow  Speech Volume: Decreased  Handedness: Left   Mood and Affect  Mood: Anxious  Affect: Congruent   Thought Process  Thought Processes: Goal Directed  Descriptions of Associations:Circumstantial  Orientation:Partial  Thought Content:Scattered  History of Schizophrenia/Schizoaffective disorder:Yes  Duration of Psychotic Symptoms:Greater than six months  Hallucinations:No data recorded Ideas of Reference:None  Suicidal Thoughts:No data recorded Homicidal Thoughts:No data recorded  Sensorium  Memory: Remote Good  Judgment: Fair  Insight: Fair   Materials engineer: Fair  Attention Span: Fair  Recall: [employer] of Knowledge: Fair  Language: Fair   Psychomotor Activity  Psychomotor Activity:No data recorded  Assets  Assets:No data recorded  Sleep  Sleep:No data recorded   Physical Exam: Physical Exam Constitutional:      Appearance: Normal appearance.  HENT:     Head: Normocephalic and atraumatic.     Mouth/Throat:     Pharynx: Oropharynx is clear.  Eyes:     Pupils: Pupils are equal, round, and reactive to light.  Cardiovascular:     Rate and  Rhythm: Normal rate and regular rhythm.  Pulmonary:     Effort: Pulmonary effort is normal.     Breath sounds: Normal breath sounds.  Abdominal:     General: Abdomen is flat.     Palpations: Abdomen is soft.  Musculoskeletal:        General: Normal range of motion.  Skin:    General: Skin is warm and dry.  Neurological:     General: No focal deficit present.     Mental Status: He is alert. Mental status is at baseline.  Psychiatric:        Attention and Perception: He is inattentive.        Mood and Affect: Mood normal. Affect is blunt.        Speech: He is noncommunicative.    Review of Systems  Unable to perform ROS: Language   Blood pressure 121/73, pulse 88, temperature 99 F (37.2 C), temperature source Oral, resp. rate 16, height '5\' 8"'$  (1.727 m), weight 85.8 kg, SpO2 100 %. Body mass index is 28.76 kg/m.   Treatment Plan Summary: Medication management and Plan no change to medication.  Follow-up with attempts at finding placement.  Alethia Berthold, MD 02/03/2023, 12:06 PM

## 2023-02-03 NOTE — Plan of Care (Signed)
D: Patient denies SI / HI / AVH. Pt at times has loud outbursts and is quickly agitated at times. No signs of distress or injury at this time. Pt will frequently come to nurses station with demands.   Action:  Q x 15 minute observation checks were completed for safety. Patient was provided with education on medications. Patient was offered support and encouragement. Patient was given scheduled medications. Patient  was encourage to attend groups, participate in unit activities and continue with plan of care.    Response: Patient has no complaints at this time. Patient is receptive to treatment and safety maintained on unit.    Problem: Education: Goal: Ability to state activities that reduce stress will improve Outcome: Not Progressing   Problem: Coping: Goal: Ability to identify and develop effective coping behavior will improve Outcome: Not Progressing   Problem: Self-Concept: Goal: Ability to identify factors that promote anxiety will improve Outcome: Not Progressing

## 2023-02-04 NOTE — BHH Counselor (Signed)
CSW contacted the following in an effort to identify potential placement for patient:       [place]       [facility] [place] [phone]   [facility] Currently full at this time.  [facility] [phone] [place] left HIPAA compliant.   [place] [phone] [facility] Number not in working order.   [facility] [place] [phone] [facility] CSW left HIPAA compliant.  [facility] [phone] [facility] Male beds available.  Asked to call [phone].  One male bed available. Information to be sent to [email or web address].  Fax to [phone].  CSW has sent the information with no issue.    [place] [phone] [place] left HIPAA compliant.  [facility] [phone] [employer] rang without option to leave a voicemail message.   [facility] [phone] [place] left HIPAA compliant.  [place] [phone] [place] Two male bed available.  Requested information be faxed to [phone].     Assunta Curtis, MSW, LCSW 02/04/2023 4:21 PM

## 2023-02-04 NOTE — Group Note (Signed)
Recreation Therapy Group Note   Group Topic:Self-Esteem  Group Date: 02/04/2023 Start Time: 1000 End Time: 1055 Facilitators: [name], LRT, CTRS Location:  [facility]  Group Description: Positive Focus. Patients are given a handout that has 9 different boxes on it. Each box has a different prompt on it that requires you to identify and add something positive about themselves. LRT encourages pts to share two of their boxes to the group. LRT and pts discuss the importance of "thinking positive", self-esteem and how they can apply it to their everyday life post-discharge.  Affect/Mood: N/A   Participation Level: Non-verbal   Participation Quality: None   Behavior: Disinterested and DIsruptive   Speech/Thought Process: N/A   Insight: Limited   Judgement: Limited   Modes of Intervention: Guided Discussion and Worksheet   Patient Response to Interventions:  Disengaged   Education Outcome:  In group clarification offered    Clinical Observations/Individualized Feedback: Adam [name] was not active in their participation of session activities and group discussion. Pt was in and out of group many times. Pt coming and going began to disrupt peers in group. Pt did not speak or participate while in group.   Plan: Continue to engage patient in RT group sessions 2-3x/week.   [name], LRT, CTRS 02/04/2023 11:09 AM

## 2023-02-04 NOTE — Progress Notes (Signed)
Patient alert and oriented x 3, affect is flat but brightens upon approach, he appears less anxious, interacting appropriately with peers and staff. 15 minutes safety checks maintained will continue to monitor closely.

## 2023-02-04 NOTE — Group Note (Signed)
[place] LCSW Group Therapy Note   Group Date: 02/04/2023 Start Time: 1300 End Time: 1400  Type of Therapy and Topic:  Group Therapy:  Feelings around Relapse and Recovery  Participation Level:  None   Description of Group:    Patients in this group will discuss emotions they experience before and after a relapse. They will process how experiencing these feelings, or avoidance of experiencing them, relates to having a relapse. Facilitator will guide patients to explore emotions they have related to recovery. Patients will be encouraged to process which emotions are more powerful. They will be guided to discuss the emotional reaction significant others in their lives may have to patients' relapse or recovery. Patients will be assisted in exploring ways to respond to the emotions of others without this contributing to a relapse.  Therapeutic Goals: Patient will identify two or more emotions that lead to relapse for them:  Patient will identify two emotions that result when they relapse:  Patient will identify two emotions related to recovery:  Patient will demonstrate ability to communicate their needs through discussion and/or role plays.   Summary of Patient Progress:  Patient unable to participate due to expressive aphasia as a result of stroke.    Therapeutic Modalities:   Cognitive Behavioral Therapy Solution-Focused Therapy Assertiveness Training Relapse Prevention Therapy   [name], [place]

## 2023-02-04 NOTE — Progress Notes (Signed)
[facility] MD Progress Note  02/04/2023 2:06 PM Adam [name]  MRN:  [id number] Subjective: Follow-up 62 year old man with schizophrenia and aphasia.  No change to behavior no change to evident complaints. 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)  Total Time spent with patient: 30 minutes  Past Psychiatric History: Past history of schizophrenia  Past Medical History:  Past Medical History:  Diagnosis Date   Myocardial infarction ([place])    Schizophrenia ([place])    Stroke ([place])     Past Surgical History:  Procedure Laterality Date   NO PAST SURGERIES     Family History:  Family History  Problem Relation Age of Onset   Hypertension Mother    Family Psychiatric  History: See previous Social History:  Social History   Substance and Sexual Activity  Alcohol Use Not Currently     Social History   Substance and Sexual Activity  Drug Use Not Currently    Social History   Socioeconomic History   Marital status: Single    Spouse name: Not on file   Number of children: Not on file   Years of education: Not on file   Highest education level: Not on file  Occupational History   Not on file  Tobacco Use   Smoking status: Never    Passive exposure: Never   Smokeless tobacco: Never  Vaping Use   Vaping Use: Unknown  Substance and Sexual Activity   Alcohol use: Not Currently   Drug use: Not Currently   Sexual activity: Not Currently  Other Topics Concern   Not on file  Social History Narrative   Not on file   Social Determinants of Health    Financial Resource Strain: Not on file  Food Insecurity: Not on file  Transportation Needs: Not on file  Physical Activity: Not on file  Stress: Not on file  Social Connections: Not on file   Additional Social History:  Specify valuables returned:  (none)                      Sleep: Fair  Appetite:  Fair  Current Medications: Current Facility-Administered Medications  Medication Dose Route Frequency Provider Last Rate Last Admin   acetaminophen (TYLENOL) tablet 650 mg  650 mg Oral Q6H PRN Lanis Storlie T, MD   650 mg at 02/03/23 0906   alum & mag hydroxide-simeth (MAALOX/MYLANTA) 200-200-20 MG/5ML suspension 30 mL  30 mL Oral Q4H PRN Carita Sollars T, MD   30 mL at 04/26/22 [id number]   aspirin EC tablet 81 mg  81 mg Oral Daily Quaniya Damas T, MD   81 mg at 02/04/23 0827   atorvastatin (LIPITOR) tablet 20 mg  20 mg Oral Daily Lovelee Forner T, MD   20 mg at 02/04/23 0827   atropine 1 % ophthalmic solution 1 drop  1 drop Sublingual QID [name], MD   1 drop at 02/04/23 1138   benztropine (COGENTIN) tablet 0.5 mg  0.5 mg Oral BID Juvia Aerts T, MD   0.5 mg at 02/04/23 0827   clonazePAM (KLONOPIN) tablet 1 mg  1 mg  Oral TID PRN He, [name], MD   1 mg at 02/02/23 2125   cloZAPine (CLOZARIL) tablet 300 mg  300 mg Oral QHS Shailah Gibbins T, MD   300 mg at 02/03/23 2120   diphenhydrAMINE (BENADRYL) capsule 50 mg  50 mg Oral Q6H PRN Soo Steelman T, MD   50 mg at 01/28/23 2105   Or   diphenhydrAMINE (BENADRYL) injection 50 mg  50 mg Intramuscular Q6H PRN Lucindia Lemley T, MD   50 mg at 01/01/23 2252   feeding supplement (ENSURE ENLIVE / ENSURE PLUS) liquid 237 mL  237 mL Oral BID BM Arlo Butt T, MD   237 mL at 02/04/23 0908   gabapentin (NEURONTIN) capsule 300 mg  300 mg Oral TID Dyke Weible T, MD   300 mg at 02/04/23 0908   haloperidol (HALDOL) tablet 2 mg  2 mg Oral TID Anayiah Howden T, MD   2 mg at 02/04/23 [id number]   haloperidol (HALDOL) tablet 5 mg  5 mg Oral Q6H PRN Lexani Corona,  Kjersten Ormiston T, MD   5 mg at 01/23/23 [id number]   Or   haloperidol lactate (HALDOL) injection 5 mg  5 mg Intramuscular Q6H PRN Jeanifer Halliday T, MD   5 mg at 01/01/23 2252   lisinopril (ZESTRIL) tablet 5 mg  5 mg Oral Daily Lyrik Buresh T, MD   5 mg at 02/04/23 0827   magnesium hydroxide (MILK OF MAGNESIA) suspension 30 mL  30 mL Oral Daily PRN Taelor Waymire T, MD   30 mL at 01/20/23 2133   metoprolol succinate (TOPROL-XL) 24 hr tablet 25 mg  25 mg Oral Daily Virgil Slinger T, MD   25 mg at 02/04/23 0827   ondansetron (ZOFRAN) tablet 4 mg  4 mg Oral Q8H PRN [name], NP   4 mg at 09/06/22 2320   paliperidone (INVEGA SUSTENNA) injection 156 mg  156 mg Intramuscular Q28 days Shabreka Coulon T, MD   156 mg at 01/15/23 1652   paliperidone (INVEGA) 24 hr tablet 6 mg  6 mg Oral QHS Elverna Caffee T, MD   6 mg at 02/03/23 2120   senna-docusate (Senokot-S) tablet 2 tablet  2 tablet Oral QHS PRN [name], DO   2 tablet at 01/20/23 2133   temazepam (RESTORIL) capsule 15 mg  15 mg Oral QHS Nhia Heaphy T, MD   15 mg at 02/03/23 2120   ziprasidone (GEODON) injection 20 mg  20 mg Intramuscular Q12H PRN Giacomo Valone, [name], MD   20 mg at 07/08/22 1453    Lab Results: No results found for this or any previous visit (from the past 48 hour(s)).  Blood Alcohol level:  Lab Results  Component Value Date   ETH <10 [id number]    Metabolic Disorder Labs: Lab Results  Component Value Date   HGBA1C 5.5 04/10/2022   MPG 111.15 04/10/2022   No results found for: "PROLACTIN" Lab Results  Component Value Date   CHOL 167 11/20/2021   TRIG 107 11/20/2021   HDL 26 (L) 11/20/2021   CHOLHDL 6.4 11/20/2021   VLDL 21 11/20/2021   LDLCALC 120 (H) 11/20/2021    Physical Findings: AIMS: Facial and Oral Movements Muscles of Facial Expression: None, normal Lips and Perioral Area: None, normal Jaw: None, normal Tongue: None, normal,Extremity Movements Upper (arms, wrists, hands, fingers): None, normal Lower  (legs, knees, ankles, toes): None, normal, Trunk Movements Neck, shoulders, hips: None, normal, Overall Severity Severity of abnormal movements (highest score from questions above):  None, normal Incapacitation due to abnormal movements: None, normal Patient's awareness of abnormal movements (rate only patient's report): No Awareness, Dental Status Current problems with teeth and/or dentures?: No Does patient usually wear dentures?: No  CIWA:    COWS:     Musculoskeletal: Strength & Muscle Tone: within normal limits Gait & Station: normal Patient leans: N/A  Psychiatric Specialty Exam:  Presentation  General Appearance:  Appropriate for Environment; Casual; Neat; Well Groomed  Eye Contact: Fair  Speech: Slow  Speech Volume: Decreased  Handedness: Left   Mood and Affect  Mood: Anxious  Affect: Congruent   Thought Process  Thought Processes: Goal Directed  Descriptions of Associations:Circumstantial  Orientation:Partial  Thought Content:Scattered  History of Schizophrenia/Schizoaffective disorder:Yes  Duration of Psychotic Symptoms:Greater than six months  Hallucinations:No data recorded Ideas of Reference:None  Suicidal Thoughts:No data recorded Homicidal Thoughts:No data recorded  Sensorium  Memory: Remote Good  Judgment: Fair  Insight: Fair   Community education officer  Concentration: Fair  Attention Span: Fair  Recall: [employer] of Knowledge: Fair  Language: Fair   Psychomotor Activity  Psychomotor Activity:No data recorded  Assets  Assets:No data recorded  Sleep  Sleep:No data recorded   Physical Exam: Physical Exam Vitals and nursing note reviewed.  Constitutional:      Appearance: Normal appearance.  HENT:     Head: Normocephalic and atraumatic.     Mouth/Throat:     Pharynx: Oropharynx is clear.  Eyes:     Pupils: Pupils are equal, round, and reactive to light.  Cardiovascular:     Rate and Rhythm: Normal  rate and regular rhythm.  Pulmonary:     Effort: Pulmonary effort is normal.     Breath sounds: Normal breath sounds.  Abdominal:     General: Abdomen is flat.     Palpations: Abdomen is soft.  Musculoskeletal:        General: Normal range of motion.  Skin:    General: Skin is warm and dry.  Neurological:     General: No focal deficit present.     Mental Status: He is alert. Mental status is at baseline.  Psychiatric:        Attention and Perception: He is inattentive.        Mood and Affect: Mood normal. Affect is blunt.        Speech: He is noncommunicative.    Review of Systems  Unable to perform ROS: Language   Blood pressure 104/75, pulse (!) 107, temperature 98.1 F (36.7 C), temperature source Oral, resp. rate 16, height '5\' 8"'$  (1.727 m), weight 85.8 kg, SpO2 98 %. Body mass index is 28.76 kg/m.   Treatment Plan Summary: Medication management and Plan no change to medication management no change to treatment plan.  Entirely focused on discharge planning.  Patient's mother wants to come visit him and says she cannot come during regular visiting hours.  We are working to try and make accommodations for a specific day in the morning.  Alethia Berthold, MD 02/04/2023, 2:06 PM

## 2023-02-05 LAB — CBC WITH DIFFERENTIAL/PLATELET
Abs Immature Granulocytes: 0.01 10*3/uL (ref 0.00–0.07)
Basophils Absolute: 0.2 10*3/uL — ABNORMAL HIGH (ref 0.0–0.1)
Basophils Relative: 4 %
Eosinophils Absolute: 0 10*3/uL (ref 0.0–0.5)
Eosinophils Relative: 0 %
HCT: 40.9 % (ref 39.0–52.0)
Hemoglobin: 14.1 g/dL (ref 13.0–17.0)
Immature Granulocytes: 0 %
Lymphocytes Relative: 23 %
Lymphs Abs: 1 10*3/uL (ref 0.7–4.0)
MCH: 30.5 pg (ref 26.0–34.0)
MCHC: 34.5 g/dL (ref 30.0–36.0)
MCV: 88.3 fL (ref 80.0–100.0)
Monocytes Absolute: 0.5 10*3/uL (ref 0.1–1.0)
Monocytes Relative: 11 %
Neutro Abs: 2.8 10*3/uL (ref 1.7–7.7)
Neutrophils Relative %: 62 %
Platelets: 155 10*3/uL (ref 150–400)
RBC: 4.63 MIL/uL (ref 4.22–5.81)
RDW: 13.4 % (ref 11.5–15.5)
WBC: 4.5 10*3/uL (ref 4.0–10.5)
nRBC: 0 % (ref 0.0–0.2)

## 2023-02-05 NOTE — Progress Notes (Signed)
Patient verbalized no issues. Tylenol given for right leg pain with good result.No irritable behaviors noted today. Appetite and energy level good. Support and encouragement given.

## 2023-02-05 NOTE — Progress Notes (Signed)
Pharmacy Consult - Clozapine     62 yo male ordered clozapine 300 mg PO daily  This patient's order has been reviewed for prescribing contraindications.    Clozapine REMS enrollment Verified: yes on 1/7 REMS patient ID: [id number] Current Outpatient Monitoring: Weekly    Home Regimen:  300 mg PO QHS    Dose Adjustments This Admission: N/A   Labs: Date    [place]    Submitted? 1/7 2600 Yes 1/14 3900 Yes 1/21 3600 Yes 1/29 3400 Yes   2/6 3500 Yes   2/13 3400 Yes 2/20 3100 Yes 2/27 2900 Yes 3/5 2800 Yes  Plan: Continue clozapine 300 mg QHS Monitor ANC at least weekly while inpatient. Next [place] 02/12/2023   Tollie Eth III, PharmD Clinical Pharmacist  02/05/2023 11:43 AM

## 2023-02-05 NOTE — Group Note (Signed)
LCSW Group Therapy Note  Group Date: 02/05/2023 Start Time: 1310 End Time: 1400   Type of Therapy and Topic:  Group Therapy: Positive Affirmations  Participation Level:  None   Description of Group:   This group addressed positive affirmation towards self and others.  Patients went around the room and identified two positive things about themselves and two positive things about a peer in the room.  Patients reflected on how it felt to share something positive with others, to identify positive things about themselves, and to hear positive things from others/ Patients were encouraged to have a daily reflection of positive characteristics or circumstances.   Therapeutic Goals: Patients will verbalize two of their positive qualities Patients will demonstrate empathy for others by stating two positive qualities about a peer in the group Patients will verbalize their feelings when voicing positive self affirmations and when voicing positive affirmations of others Patients will discuss the potential positive impact on their wellness/recovery of focusing on positive traits of self and others.  Summary of Patient Progress:   Patient was present in group.  Patient's group contribution was "[employer]".  Patient did state "anxiety" when CSW asked how patient felt about public speaking.   Therapeutic Modalities:   Cognitive Behavioral Therapy Motivational Interviewing    Rozann Lesches, [place] 02/05/2023  2:18 PM

## 2023-02-05 NOTE — BHH Group Notes (Signed)
[place] Group Notes:  (Nursing/MHT/Case Management/Adjunct)  Date:  02/05/2023  Time:  9:51 AM  Type of Therapy:  community meeting  Participation Level:  Did Not Attend    Antonieta Pert 02/05/2023, 9:51 AM

## 2023-02-05 NOTE — Progress Notes (Signed)
Patient compliant with medications interacting well with Peers and Staff no S/S of distress. Q 15 minutes safety checks ongoing. Patient remains safe.

## 2023-02-05 NOTE — BHH Counselor (Signed)
CSW was able to schedule patient for a group home interview on 3/7/202 at [place], MSW, LCSW 02/05/2023 12:25 PM

## 2023-02-05 NOTE — Progress Notes (Signed)
[facility] MD Progress Note  02/05/2023 4:14 PM Adam [name]  MRN:  [id number] Subjective: Follow-up for this 62 year old man with schizophrenia.  No change to behavior.  Everything exactly the same.  No medical changes. 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)  Total Time spent with patient: 15 minutes  Past Psychiatric History: History of schizophrenia  Past Medical History:  Past Medical History:  Diagnosis Date   Myocardial infarction ([place])    Schizophrenia ([place])    Stroke ([place])     Past Surgical History:  Procedure Laterality Date   NO PAST SURGERIES     Family History:  Family History  Problem Relation Age of Onset   Hypertension Mother    Family Psychiatric  History: See above Social History:  Social History   Substance and Sexual Activity  Alcohol Use Not Currently     Social History   Substance and Sexual Activity  Drug Use Not Currently    Social History   Socioeconomic History   Marital status: Single    Spouse name: Not on file   Number of children: Not on file   Years of education: Not on file   Highest education level: Not on file  Occupational History   Not on file  Tobacco Use   Smoking status: Never    Passive exposure: Never   Smokeless tobacco: Never  Vaping Use   Vaping Use: Unknown  Substance and Sexual Activity   Alcohol use: Not Currently   Drug use: Not Currently   Sexual activity: Not Currently  Other Topics Concern   Not on file  Social History Narrative   Not on file   Social Determinants of Health    Financial Resource Strain: Not on file  Food Insecurity: Not on file  Transportation Needs: Not on file  Physical Activity: Not on file  Stress: Not on file  Social Connections: Not on file   Additional Social History:  Specify valuables returned:  (none)                      Sleep: Fair  Appetite:  Fair  Current Medications: Current Facility-Administered Medications  Medication Dose Route Frequency Provider Last Rate Last Admin   acetaminophen (TYLENOL) tablet 650 mg  650 mg Oral Q6H PRN Sharise Lippy T, MD   650 mg at 02/05/23 0755   alum & mag hydroxide-simeth (MAALOX/MYLANTA) 200-200-20 MG/5ML suspension 30 mL  30 mL Oral Q4H PRN Polina Burmaster T, MD   30 mL at 04/26/22 [id number]   aspirin EC tablet 81 mg  81 mg Oral Daily Syanna Remmert T, MD   81 mg at 02/05/23 0755   atorvastatin (LIPITOR) tablet 20 mg  20 mg Oral Daily Rikita Grabert T, MD   20 mg at 02/05/23 0755   atropine 1 % ophthalmic solution 1 drop  1 drop Sublingual QID [name], MD   1 drop at 02/05/23 1154   benztropine (COGENTIN) tablet 0.5 mg  0.5 mg Oral BID Jorrell Kuster T, MD   0.5 mg at 02/05/23 0755   clonazePAM (KLONOPIN) tablet 1 mg  1 mg Oral TID PRN He, [name], MD   1 mg at 02/02/23 2125   cloZAPine (CLOZARIL) tablet 300 mg  300 mg Oral QHS Khush Pasion T, MD   300 mg at 02/04/23 2119   diphenhydrAMINE (BENADRYL) capsule 50 mg  50 mg Oral Q6H PRN Boubacar Lerette, [name], MD   50 mg at 01/28/23 2105   Or   diphenhydrAMINE (BENADRYL) injection 50 mg  50 mg Intramuscular Q6H PRN Ashika Apuzzo, [name], MD   50 mg at 01/01/23 2252   feeding supplement (ENSURE ENLIVE / ENSURE PLUS) liquid 237 mL  237 mL Oral BID BM Clarance Bollard T, MD   237 mL at 02/05/23 1413   gabapentin (NEURONTIN) capsule 300 mg  300 mg Oral TID Shirell Struthers, [name], MD   300 mg at 02/05/23 1513   haloperidol (HALDOL) tablet 2 mg  2 mg Oral TID Lew Prout, [name], MD   2 mg at 02/05/23 1513   haloperidol (HALDOL) tablet 5 mg  5 mg Oral Q6H PRN Lamiah Marmol,  [name], MD   5 mg at 01/23/23 [id number]   Or   haloperidol lactate (HALDOL) injection 5 mg  5 mg Intramuscular Q6H PRN Kessa Fairbairn, [name], MD   5 mg at 01/01/23 2252   lisinopril (ZESTRIL) tablet 5 mg  5 mg Oral Daily Ikia Cincotta, [name], MD   5 mg at 02/05/23 0755   magnesium hydroxide (MILK OF MAGNESIA) suspension 30 mL  30 mL Oral Daily PRN Tiegan Jambor, [name], MD   30 mL at 01/20/23 2133   metoprolol succinate (TOPROL-XL) 24 hr tablet 25 mg  25 mg Oral Daily Bunyan Brier T, MD   25 mg at 02/05/23 0755   ondansetron (ZOFRAN) tablet 4 mg  4 mg Oral Q8H PRN Deloria Lair, NP   4 mg at 09/06/22 2320   paliperidone (INVEGA SUSTENNA) injection 156 mg  156 mg Intramuscular Q28 days Bently Morath, [name], MD   156 mg at 01/15/23 1652   paliperidone (INVEGA) 24 hr tablet 6 mg  6 mg Oral QHS Nicholai Willette T, MD   6 mg at 02/04/23 2118   senna-docusate (Senokot-S) tablet 2 tablet  2 tablet Oral QHS PRN [name], DO   2 tablet at 01/20/23 2133   temazepam (RESTORIL) capsule 15 mg  15 mg Oral QHS Arminta Gamm T, MD   15 mg at 02/04/23 2119   ziprasidone (GEODON) injection 20 mg  20 mg Intramuscular Q12H PRN Laban Orourke, [name], MD   20 mg at 07/08/22 1453    Lab Results:  Results for orders placed or performed during the hospital encounter of 03/19/22 (from the past 48 hour(s))  CBC with Differential/Platelet     Status: Abnormal   Collection Time: 02/05/23 11:06 AM  Result Value Ref Range   WBC 4.5 4.0 - 10.5 K/uL   RBC 4.63 4.22 - 5.81 MIL/uL   Hemoglobin 14.1 13.0 - 17.0 g/dL   HCT 40.9 39.0 - 52.0 %   MCV 88.3 80.0 - 100.0 fL   MCH 30.5 26.0 - 34.0 pg   MCHC 34.5 30.0 - 36.0 g/dL   RDW 13.4 11.5 - 15.5 %   Platelets 155 150 - 400 K/uL   nRBC 0.0 0.0 - 0.2 %   Neutrophils Relative % 62 %   Neutro Abs 2.8 1.7 - 7.7 K/uL   Lymphocytes Relative 23 %   Lymphs Abs 1.0 0.7 - 4.0 K/uL   Monocytes Relative 11 %  Monocytes Absolute 0.5 0.1 - 1.0 K/uL   Eosinophils Relative 0 %   Eosinophils Absolute  0.0 0.0 - 0.5 K/uL   Basophils Relative 4 %   Basophils Absolute 0.2 (H) 0.0 - 0.1 K/uL   Immature Granulocytes 0 %   Abs Immature Granulocytes 0.01 0.00 - 0.07 K/uL    Comment: Performed at [facility], [address] [place], [place] [address]    Blood Alcohol level:  Lab Results  Component Value Date   [facility] <10 [id number]    Metabolic Disorder Labs: Lab Results  Component Value Date   HGBA1C 5.5 04/10/2022   MPG 111.15 04/10/2022   No results found for: "PROLACTIN" Lab Results  Component Value Date   CHOL 167 11/20/2021   TRIG 107 11/20/2021   HDL 26 (L) 11/20/2021   CHOLHDL 6.4 11/20/2021   VLDL 21 11/20/2021   LDLCALC 120 (H) 11/20/2021    Physical Findings: AIMS: Facial and Oral Movements Muscles of Facial Expression: None, normal Lips and Perioral Area: None, normal Jaw: None, normal Tongue: None, normal,Extremity Movements Upper (arms, wrists, hands, fingers): None, normal Lower (legs, knees, ankles, toes): None, normal, Trunk Movements Neck, shoulders, hips: None, normal, Overall Severity Severity of abnormal movements (highest score from questions above): None, normal Incapacitation due to abnormal movements: None, normal Patient's awareness of abnormal movements (rate only patient's report): No Awareness, Dental Status Current problems with teeth and/or dentures?: No Does patient usually wear dentures?: No  CIWA:    COWS:     Musculoskeletal: Strength & Muscle Tone: within normal limits Gait & Station: normal Patient leans: N/A  Psychiatric Specialty Exam:  Presentation  General Appearance:  Appropriate for Environment; Casual; Neat; Well Groomed  Eye Contact: Fair  Speech: Slow  Speech Volume: Decreased  Handedness: Left   Mood and Affect  Mood: Anxious  Affect: Congruent   Thought Process  Thought Processes: Goal Directed  Descriptions of Associations:Circumstantial  Orientation:Partial  Thought  Content:Scattered  History of Schizophrenia/Schizoaffective disorder:Yes  Duration of Psychotic Symptoms:Greater than six months  Hallucinations:No data recorded Ideas of Reference:None  Suicidal Thoughts:No data recorded Homicidal Thoughts:No data recorded  Sensorium  Memory: Remote Good  Judgment: Fair  Insight: Fair   Community education officer  Concentration: Fair  Attention Span: Fair  Recall: [employer] of Knowledge: Fair  Language: Fair   Psychomotor Activity  Psychomotor Activity:No data recorded  Assets  Assets:No data recorded  Sleep  Sleep:No data recorded   Physical Exam: Physical Exam Vitals and nursing note reviewed.  Constitutional:      Appearance: Normal appearance.  HENT:     Head: Normocephalic and atraumatic.     Mouth/Throat:     Pharynx: Oropharynx is clear.  Eyes:     Pupils: Pupils are equal, round, and reactive to light.  Cardiovascular:     Rate and Rhythm: Normal rate and regular rhythm.  Pulmonary:     Effort: Pulmonary effort is normal.     Breath sounds: Normal breath sounds.  Abdominal:     General: Abdomen is flat.     Palpations: Abdomen is soft.  Musculoskeletal:        General: Normal range of motion.  Skin:    General: Skin is warm and dry.  Neurological:     General: No focal deficit present.     Mental Status: He is alert. Mental status is at baseline.  Psychiatric:        Attention and Perception: Attention normal.  Mood and Affect: Mood normal.        Speech: He is noncommunicative.    Review of Systems  Unable to perform ROS: Language   Blood pressure 117/78, pulse 88, temperature 98 F (36.7 C), temperature source Oral, resp. rate 16, height '5\' 8"'$  (1.727 m), weight 85.8 kg, SpO2 99 %. Body mass index is 28.76 kg/m.   Treatment Plan Summary: Plan everything about him is completely stable.  Looking forward to possible visit from his mother in the next couple days but we are not going to  talk with him about it until it happens to avoid agitation.  Still focused entirely on discharge planning.  Alethia Berthold, MD 02/05/2023, 4:14 PM

## 2023-02-06 NOTE — BHH Counselor (Signed)
CSW spoke with Tamsen Meek with [place].  She confirms that DSS will work with patient for 30-45 days.  She also confirms that they will assist in identifying placement for patient "once we have confirmation of his financial situation".  She reports plans to follow up with the patient's mother.  She reports plans to visit patient on 02/08/2023 at 11AM.  .ms

## 2023-02-06 NOTE — Progress Notes (Signed)
Patient compliant with medications, Interacting well with Peers and Staff. Support and encouragement provided. Q 15 minutes safety checks ongoing. Patient remains safe.

## 2023-02-06 NOTE — Group Note (Signed)
Date:  02/06/2023 Time:  11:53 PM  Group Topic/Focus:  Making Healthy Choices:   The focus of this group is to help patients identify negative/unhealthy choices they were using prior to admission and identify positive/healthier coping strategies to replace them upon discharge. Wrap-Up Group:   The focus of this group is to help patients review their daily goal of treatment and discuss progress on daily workbooks.    Participation Level:  Active  Participation Quality:  Appropriate  Affect:  Appropriate  Cognitive:  Alert  Insight: Limited  Engagement in Group:  Limited  Modes of Intervention:  Discussion  Additional Comments:    Ladona Mow 02/06/2023, 11:53 PM

## 2023-02-06 NOTE — Progress Notes (Signed)
[facility] MD Progress Note  02/06/2023 11:34 AM Adam [name]  MRN:  [id number] Subjective: Follow-up patient with schizophrenia.  No change to behavior.  Completely stable. 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)  Total Time spent with patient: 15 minutes  Past Psychiatric History: History of schizophrenia and stroke  Past Medical History:  Past Medical History:  Diagnosis Date   Myocardial infarction ([place])    Schizophrenia ([place])    Stroke ([place])     Past Surgical History:  Procedure Laterality Date   NO PAST SURGERIES     Family History:  Family History  Problem Relation Age of Onset   Hypertension Mother    Family Psychiatric  History: See previous Social History:  Social History   Substance and Sexual Activity  Alcohol Use Not Currently     Social History   Substance and Sexual Activity  Drug Use Not Currently    Social History   Socioeconomic History   Marital status: Single    Spouse name: Not on file   Number of children: Not on file   Years of education: Not on file   Highest education level: Not on file  Occupational History   Not on file  Tobacco Use   Smoking status: Never    Passive exposure: Never   Smokeless tobacco: Never  Vaping Use   Vaping Use: Unknown  Substance and Sexual Activity   Alcohol use: Not Currently   Drug use: Not Currently   Sexual activity: Not Currently  Other Topics Concern   Not on file  Social History Narrative   Not on file   Social Determinants of Health   Financial Resource Strain:  Not on file  Food Insecurity: Not on file  Transportation Needs: Not on file  Physical Activity: Not on file  Stress: Not on file  Social Connections: Not on file   Additional Social History:  Specify valuables returned:  (none)                      Sleep: Fair  Appetite:  Fair  Current Medications: Current Facility-Administered Medications  Medication Dose Route Frequency Provider Last Rate Last Admin   acetaminophen (TYLENOL) tablet 650 mg  650 mg Oral Q6H PRN Lopez Dentinger T, MD   650 mg at 02/06/23 0811   alum & mag hydroxide-simeth (MAALOX/MYLANTA) 200-200-20 MG/5ML suspension 30 mL  30 mL Oral Q4H PRN Guenevere Roorda T, MD   30 mL at 04/26/22 [id number]   aspirin EC tablet 81 mg  81 mg Oral Daily Beza Steppe, [name], MD   81 mg at 02/06/23 0811   atorvastatin (LIPITOR) tablet 20 mg  20 mg Oral Daily Ancelmo Hunt T, MD   20 mg at 02/06/23 0812   atropine 1 % ophthalmic solution 1 drop  1 drop Sublingual QID [name], MD   1 drop at 02/06/23 [id number]   benztropine (COGENTIN) tablet 0.5 mg  0.5 mg Oral BID Riana Tessmer T, MD   0.5 mg at 02/06/23 0811   clonazePAM (KLONOPIN) tablet 1 mg  1 mg Oral TID PRN He,  [name], MD   1 mg at 02/02/23 2125   cloZAPine (CLOZARIL) tablet 300 mg  300 mg Oral QHS Reneta Niehaus, [name], MD   300 mg at 02/05/23 2120   diphenhydrAMINE (BENADRYL) capsule 50 mg  50 mg Oral Q6H PRN Fredderick Swanger, [name], MD   50 mg at 01/28/23 2105   Or   diphenhydrAMINE (BENADRYL) injection 50 mg  50 mg Intramuscular Q6H PRN Jazarah Capili, [name], MD   50 mg at 01/01/23 2252   feeding supplement (ENSURE ENLIVE / ENSURE PLUS) liquid 237 mL  237 mL Oral BID BM Casha Estupinan T, MD   237 mL at 02/06/23 1004   gabapentin (NEURONTIN) capsule 300 mg  300 mg Oral TID Kenna Kirn T, MD   300 mg at 02/06/23 0900   haloperidol (HALDOL) tablet 2 mg  2 mg Oral TID Casimir Barcellos, [name], MD   2 mg at 02/06/23 0900   haloperidol (HALDOL) tablet 5 mg  5 mg Oral Q6H PRN Krishna Heuer, [name], MD   5 mg at 01/23/23 [id number]    Or   haloperidol lactate (HALDOL) injection 5 mg  5 mg Intramuscular Q6H PRN Bucky Grigg, [name], MD   5 mg at 01/01/23 2252   lisinopril (ZESTRIL) tablet 5 mg  5 mg Oral Daily Zohra Clavel, [name], MD   5 mg at 02/06/23 [id number]   magnesium hydroxide (MILK OF MAGNESIA) suspension 30 mL  30 mL Oral Daily PRN Kobi Mario T, MD   30 mL at 01/20/23 2133   metoprolol succinate (TOPROL-XL) 24 hr tablet 25 mg  25 mg Oral Daily Latandra Loureiro T, MD   25 mg at 02/06/23 0812   ondansetron (ZOFRAN) tablet 4 mg  4 mg Oral Q8H PRN Deloria Lair, NP   4 mg at 09/06/22 2320   paliperidone (INVEGA SUSTENNA) injection 156 mg  156 mg Intramuscular Q28 days Kasie Leccese, [name], MD   156 mg at 01/15/23 1652   paliperidone (INVEGA) 24 hr tablet 6 mg  6 mg Oral QHS Marise Knapper T, MD   6 mg at 02/05/23 2120   senna-docusate (Senokot-S) tablet 2 tablet  2 tablet Oral QHS PRN [name], DO   2 tablet at 01/20/23 2133   temazepam (RESTORIL) capsule 15 mg  15 mg Oral QHS Adlee Paar T, MD   15 mg at 02/05/23 2120   ziprasidone (GEODON) injection 20 mg  20 mg Intramuscular Q12H PRN Gwyn Hieronymus, [name], MD   20 mg at 07/08/22 1453    Lab Results:  Results for orders placed or performed during the hospital encounter of 03/19/22 (from the past 48 hour(s))  CBC with Differential/Platelet     Status: Abnormal   Collection Time: 02/05/23 11:06 AM  Result Value Ref Range   WBC 4.5 4.0 - 10.5 K/uL   RBC 4.63 4.22 - 5.81 MIL/uL   Hemoglobin 14.1 13.0 - 17.0 g/dL   HCT 40.9 39.0 - 52.0 %   MCV 88.3 80.0 - 100.0 fL   MCH 30.5 26.0 - 34.0 pg   MCHC 34.5 30.0 - 36.0 g/dL   RDW 13.4 11.5 - 15.5 %   Platelets 155 150 - 400 K/uL   nRBC 0.0 0.0 - 0.2 %   Neutrophils Relative % 62 %   Neutro Abs 2.8 1.7 - 7.7 K/uL   Lymphocytes Relative 23 %   Lymphs Abs 1.0 0.7 - 4.0 K/uL   Monocytes Relative 11 %   Monocytes Absolute 0.5 0.1 -  1.0 K/uL   Eosinophils Relative 0 %   Eosinophils Absolute 0.0 0.0 - 0.5 K/uL   Basophils  Relative 4 %   Basophils Absolute 0.2 (H) 0.0 - 0.1 K/uL   Immature Granulocytes 0 %   Abs Immature Granulocytes 0.01 0.00 - 0.07 K/uL    Comment: Performed at [facility], [address] [place], [place] [address]    Blood Alcohol level:  Lab Results  Component Value Date   [facility] <10 [id number]    Metabolic Disorder Labs: Lab Results  Component Value Date   HGBA1C 5.5 04/10/2022   MPG 111.15 04/10/2022   No results found for: "PROLACTIN" Lab Results  Component Value Date   CHOL 167 11/20/2021   TRIG 107 11/20/2021   HDL 26 (L) 11/20/2021   CHOLHDL 6.4 11/20/2021   VLDL 21 11/20/2021   LDLCALC 120 (H) 11/20/2021    Physical Findings: AIMS: Facial and Oral Movements Muscles of Facial Expression: None, normal Lips and Perioral Area: None, normal Jaw: None, normal Tongue: None, normal,Extremity Movements Upper (arms, wrists, hands, fingers): None, normal Lower (legs, knees, ankles, toes): None, normal, Trunk Movements Neck, shoulders, hips: None, normal, Overall Severity Severity of abnormal movements (highest score from questions above): None, normal Incapacitation due to abnormal movements: None, normal Patient's awareness of abnormal movements (rate only patient's report): No Awareness, Dental Status Current problems with teeth and/or dentures?: No Does patient usually wear dentures?: No  CIWA:    COWS:     Musculoskeletal: Strength & Muscle Tone: within normal limits Gait & Station: normal Patient leans: N/A  Psychiatric Specialty Exam:  Presentation  General Appearance:  Appropriate for Environment; Casual; Neat; Well Groomed  Eye Contact: Fair  Speech: Slow  Speech Volume: Decreased  Handedness: Left   Mood and Affect  Mood: Anxious  Affect: Congruent   Thought Process  Thought Processes: Goal Directed  Descriptions of Associations:Circumstantial  Orientation:Partial  Thought Content:Scattered  History of  Schizophrenia/Schizoaffective disorder:Yes  Duration of Psychotic Symptoms:Greater than six months  Hallucinations:No data recorded Ideas of Reference:None  Suicidal Thoughts:No data recorded Homicidal Thoughts:No data recorded  Sensorium  Memory: Remote Good  Judgment: Fair  Insight: Fair   Community education officer  Concentration: Fair  Attention Span: Fair  Recall: [employer] of Knowledge: Fair  Language: Fair   Psychomotor Activity  Psychomotor Activity:No data recorded  Assets  Assets:No data recorded  Sleep  Sleep:No data recorded   Physical Exam: Physical Exam Vitals and nursing note reviewed.  Constitutional:      Appearance: Normal appearance.  HENT:     Head: Normocephalic and atraumatic.     Mouth/Throat:     Pharynx: Oropharynx is clear.  Eyes:     Pupils: Pupils are equal, round, and reactive to light.  Cardiovascular:     Rate and Rhythm: Normal rate and regular rhythm.  Pulmonary:     Effort: Pulmonary effort is normal.     Breath sounds: Normal breath sounds.  Abdominal:     General: Abdomen is flat.     Palpations: Abdomen is soft.  Musculoskeletal:        General: Normal range of motion.  Skin:    General: Skin is warm and dry.  Neurological:     General: No focal deficit present.     Mental Status: He is alert. Mental status is at baseline.  Psychiatric:        Attention and Perception: He is inattentive.  Mood and Affect: Mood normal. Affect is blunt.        Speech: He is noncommunicative.    Review of Systems  Unable to perform ROS: Language   Blood pressure 117/75, pulse 90, temperature 98 F (36.7 C), temperature source Oral, resp. rate 18, height '5\' 8"'$  (1.727 m), weight 85.8 kg, SpO2 100 %. Body mass index is 28.76 kg/m.   Treatment Plan Summary: Plan patient appears to be completely stable tolerating medicine no major behavior problems.  Entirety of the hospital stay and focus remains on  discharge.  Alethia Berthold, MD 02/06/2023, 11:34 AM

## 2023-02-06 NOTE — Group Note (Signed)
Recreation Therapy Group Note   Group Topic:Other  Group Date: 02/06/2023 Start Time: 1000 End Time: 1001 Facilitators: [name], LRT, CTRS Location:  N/A  Group was not held due to LRT attending Fit Testing Trainer Class.    [name], LRT, CTRS 02/06/2023 11:31 AM

## 2023-02-06 NOTE — Plan of Care (Signed)
Pt denies anxiety/depression at this time. Pt denies SI/HI/AVH or pain at this time. Pt is calm and cooperative. Pt is medication compliant. Pt provided with support and encouragement. Pt monitored q15 minutes for safety per unit policy. Plan of care ongoing.   Problem: Self-Concept: Goal: Level of anxiety will decrease Outcome: Progressing   Problem: Nutrition: Goal: Adequate nutrition will be maintained Outcome: Progressing

## 2023-02-06 NOTE — BHH Counselor (Signed)
CSW team Myrtha Mantis., Blair Hailey.) met with [facility] care coordinators Rhett Bannister., Ernestine Mcmurray and Penny Pia.) along with [facility] leadership team Guinevere Ferrari., Corinne Ports., and M. "Gigi" Z.) to discuss pt case.   Group discussed if there was a difference between [facility] Medicaid versus [facility]. Angelique shares that she will be staffing this case with her leadership team again today and looking into other potential placement options for pt. Zack asked if they would be willing to accept an unlicensed facility like "[facility]". Angelique shared that this would also be discussed during the staffing. Keely provided a reminder that those facilities, if unlicensed, would still need to complete the single-case agreement. This was discussed briefly as this had been an issue with a previous facility which had been interested in accepting him.   Michaela updated that pt has an interview tomorrow, 02/07/23 with [facility]". They were also updated that his information had been sent to "[facility]" on Monday and to "[facility]" yesterday. CSW to follow up with these other facilities regarding potential acceptance. It was also shared that pt information had been submitted to the facility that his mother is living in but they had no bed availability and unfortunately his monthly benefits would not cover the cost of this facility long-term.   Questions around guardianship were discussed. Zack in in discussions with legal around whether it will be beneficial to seek guardianship appointment. Due to the way discussion was going, it was clarified that legally mother is not his guardian although she does hold power of attorney for him which is activated due to pt condition.   Michaela also updated everyone that there is an open APS case for this pt and team was working with them.   No other concerns expressed. Contact ended without incident.   [name].  Guerry Bruin, MSW, [place], [place] 02/06/2023 3:11 PM

## 2023-02-06 NOTE — Plan of Care (Signed)
D- Patient alert and oriented to person, place, and situation. Patient presented in a preoccupied, but pleasant mood on assessment stating that he slept good last night and had complaints of leg pain. Patient rated his pain a "6/10", in which he did request PRN pain medication from this writer to help with relief. Patient is childlike and due to a past stroke, he has expressive aphasia. Patient denied SI, HI, AVH at this time. Patient also denied signs/symptoms of depression and anxiety by shaking his head and saying "no". Patient had no stated goals for today.  A- Scheduled medications administered to patient, per MD orders. Support and encouragement provided.  Routine safety checks conducted every 15 minutes.  Patient informed to notify staff with problems or concerns.  R- No adverse drug reactions noted. Patient contracts for safety at this time. Patient compliant with medications. Patient receptive, calm, and cooperative. Patient interacts well with others on the unit. Patient remains safe at this time.  Problem: Education: Goal: Ability to state activities that reduce stress will improve Outcome: Progressing   Problem: Coping: Goal: Ability to identify and develop effective coping behavior will improve Outcome: Progressing   Problem: Self-Concept: Goal: Ability to identify factors that promote anxiety will improve Outcome: Progressing Goal: Level of anxiety will decrease Outcome: Progressing Goal: Ability to modify response to factors that promote anxiety will improve Outcome: Progressing   Problem: Education: Goal: Knowledge of General Education information will improve Description: Including pain rating scale, medication(s)/side effects and non-pharmacologic comfort measures Outcome: Progressing   Problem: Health Behavior/Discharge Planning: Goal: Ability to manage health-related needs will improve Outcome: Progressing   Problem: Clinical Measurements: Goal: Ability to maintain  clinical measurements within normal limits will improve Outcome: Progressing Goal: Will remain free from infection Outcome: Progressing Goal: Diagnostic test results will improve Outcome: Progressing Goal: Respiratory complications will improve Outcome: Progressing Goal: Cardiovascular complication will be avoided Outcome: Progressing   Problem: Activity: Goal: Risk for activity intolerance will decrease Outcome: Progressing   Problem: Nutrition: Goal: Adequate nutrition will be maintained Outcome: Progressing   Problem: Coping: Goal: Level of anxiety will decrease Outcome: Progressing   Problem: Elimination: Goal: Will not experience complications related to bowel motility Outcome: Progressing Goal: Will not experience complications related to urinary retention Outcome: Progressing   Problem: Pain Managment: Goal: General experience of comfort will improve Outcome: Progressing   Problem: Safety: Goal: Ability to remain free from injury will improve Outcome: Progressing   Problem: Skin Integrity: Goal: Risk for impaired skin integrity will decrease Outcome: Progressing   Problem: Activity: Goal: Interest or engagement in leisure activities will improve Outcome: Progressing   Problem: Coping: Goal: Coping ability will improve Outcome: Progressing   Problem: Education: Goal: Will be free of psychotic symptoms Outcome: Progressing

## 2023-02-06 NOTE — Group Note (Signed)
[facility] LCSW Group Therapy Note   Group Date: 02/06/2023 Start Time: 1300 End Time: 1400   Type of Therapy/Topic:  Group Therapy:  Emotion Regulation  Participation Level:  None   Mood:  Description of Group:    The purpose of this group is to assist patients in learning to regulate negative emotions and experience positive emotions. Patients will be guided to discuss ways in which they have been vulnerable to their negative emotions. These vulnerabilities will be juxtaposed with experiences of positive emotions or situations, and patients challenged to use positive emotions to combat negative ones. Special emphasis will be placed on coping with negative emotions in conflict situations, and patients will process healthy conflict resolution skills.  Therapeutic Goals: Patient will identify two positive emotions or experiences to reflect on in order to balance out negative emotions:  Patient will label two or more emotions that they find the most difficult to experience:  Patient will be able to demonstrate positive conflict resolution skills through discussion or role plays:   Summary of Patient Progress: Patient was present for the majority of the group process. He did not contribute to the discussion today at all with any words.   Therapeutic Modalities:   Cognitive Behavioral Therapy Feelings Identification Dialectical Behavioral Therapy   Shirl Harris, LCSW

## 2023-02-07 NOTE — Group Note (Signed)
Recreation Therapy Group Note   Group Topic:Emotion Expression  Group Date: 02/07/2023 Start Time: 1000 End Time: 1100 Facilitators: [name], LRT, CTRS Location:  Craft Room  Group Description: Gratitude Journaling. Patients and LRT discussed what gratitude means, how we can express it and what it means to [place], personally. LRT gave an education handout on the definition of gratitude that also gave different examples of gratitude exercises that they could try. One of the examples was "Gratitude Letter", which prompted you to write a letter to someone you appreciate. LRT played soft music while everyone wrote their letter. Once letter was completed, LRT encouraged people to read their letter, if they wanted to, or share who they wrote it to, at minimum. LRT and pts processed how showing gratitude towards themselves, and others can be applied to everyday life post-discharge.   Affect/Mood: N/A   Participation Level: Did not attend    Clinical Observations/Individualized Feedback: Adam [name] did not attend group due to having a meeting with Social Work.   Plan: Continue to engage patient in RT group sessions 2-3x/week.   [name], LRT, CTRS 02/07/2023 11:33 AM

## 2023-02-07 NOTE — Plan of Care (Signed)
D- Patient alert and oriented to person, place, and situation. Patient presented in a preoccupied, but pleasant mood on assessment shaking his head that he slept poorly last night. Being that patient is childlike and due to a past stroke, has expressive aphasia, this writer is unable to understand why he couldn't sleep. Patient had complaints of right leg pain, rating his pain a "6/10", in which he did request PRN pain medication to help with relief. Patient denied SI, HI, AVH at this time. Patient also denied signs/symptoms of depression or anxiety. Patient had no stated goals for today.  A- Scheduled medications administered to patient, per MD orders. Support and encouragement provided.  Routine safety checks conducted every 15 minutes.  Patient informed to notify staff with problems or concerns.  R- No adverse drug reactions noted. Patient contracts for safety at this time. Patient compliant with medications and treatment plan. Patient receptive, calm, and cooperative. Patient interacts well with others on the unit. Patient remains safe at this time.  Problem: Education: Goal: Ability to state activities that reduce stress will improve Outcome: Progressing   Problem: Coping: Goal: Ability to identify and develop effective coping behavior will improve Outcome: Progressing   Problem: Self-Concept: Goal: Ability to identify factors that promote anxiety will improve Outcome: Progressing Goal: Level of anxiety will decrease Outcome: Progressing Goal: Ability to modify response to factors that promote anxiety will improve Outcome: Progressing   Problem: Education: Goal: Knowledge of General Education information will improve Description: Including pain rating scale, medication(s)/side effects and non-pharmacologic comfort measures Outcome: Progressing   Problem: Health Behavior/Discharge Planning: Goal: Ability to manage health-related needs will improve Outcome: Progressing   Problem:  Clinical Measurements: Goal: Ability to maintain clinical measurements within normal limits will improve Outcome: Progressing Goal: Will remain free from infection Outcome: Progressing Goal: Diagnostic test results will improve Outcome: Progressing Goal: Respiratory complications will improve Outcome: Progressing Goal: Cardiovascular complication will be avoided Outcome: Progressing   Problem: Activity: Goal: Risk for activity intolerance will decrease Outcome: Progressing   Problem: Nutrition: Goal: Adequate nutrition will be maintained Outcome: Progressing   Problem: Coping: Goal: Level of anxiety will decrease Outcome: Progressing   Problem: Elimination: Goal: Will not experience complications related to bowel motility Outcome: Progressing Goal: Will not experience complications related to urinary retention Outcome: Progressing   Problem: Pain Managment: Goal: General experience of comfort will improve Outcome: Progressing   Problem: Safety: Goal: Ability to remain free from injury will improve Outcome: Progressing   Problem: Skin Integrity: Goal: Risk for impaired skin integrity will decrease Outcome: Progressing   Problem: Activity: Goal: Interest or engagement in leisure activities will improve Outcome: Progressing   Problem: Coping: Goal: Coping ability will improve Outcome: Progressing   Problem: Education: Goal: Will be free of psychotic symptoms Outcome: Progressing

## 2023-02-07 NOTE — Group Note (Signed)
[facility] LCSW Group Therapy Note    Group Date: 02/07/2023 Start Time: 1300 End Time: 1400  Type of Therapy and Topic:  Group Therapy:  Overcoming Obstacles  Participation Level:  [facility] PARTICIPATION LEVEL: None  Description of Group:   In this group patients will be encouraged to explore what they see as obstacles to their own wellness and recovery. They will be guided to discuss their thoughts, feelings, and behaviors related to these obstacles. The group will process together ways to cope with barriers, with attention given to specific choices patients can make. Each patient will be challenged to identify changes they are motivated to make in order to overcome their obstacles. This group will be process-oriented, with patients participating in exploration of their own experiences as well as giving and receiving support and challenge from other group members.  Therapeutic Goals: 1. Patient will identify personal and current obstacles as they relate to admission. 2. Patient will identify barriers that currently interfere with their wellness or overcoming obstacles.  3. Patient will identify feelings, thought process and behaviors related to these barriers. 4. Patient will identify two changes they are willing to make to overcome these obstacles:    Summary of Patient Progress Patient unable to participate in group due to expressive aphasia.    Therapeutic Modalities:   Cognitive Behavioral Therapy Solution Focused Therapy Motivational Interviewing Relapse Prevention Therapy   [name], [place]

## 2023-02-07 NOTE — BHH Counselor (Signed)
CSW and NP Intern Loree Fee, assisted patient in completing an interview with Peter Congo with [facility].   Patient did well on interview.  Peter Congo insinuated that patient could be accepted, however, she has concerns of patient making too much money to qualify for Special Assistance Medicaid to assist with placement.   CSW reports that she is unsure, however, [employer], patient's Medicaid provider has been very involved and there is a possibility that patient has some traction with additional funding.   Interview ended with no incident.  CSW spoke with Mendel Ryder with [employer] about concerns on funding.  Mendel Ryder reports that she will look into it and give Peter Congo a call.  Incident terminated without incident.  Assunta Curtis, MSW, LCSW 02/07/2023 11:46 AM

## 2023-02-07 NOTE — Progress Notes (Signed)
[facility] MD Progress Note  02/07/2023 1:45 PM Adam [name]  MRN:  [id number] Subjective: No change in behavior no new complaint. 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)  Total Time spent with patient: 15 minutes  Past Psychiatric History: History of schizophrenia  Past Medical History:  Past Medical History:  Diagnosis Date   Myocardial infarction ([place])    Schizophrenia ([place])    Stroke ([name])     Past Surgical History:  Procedure Laterality Date   NO PAST SURGERIES     Family History:  Family History  Problem Relation Age of Onset   Hypertension Mother    Family Psychiatric  History: See previous Social History:  Social History   Substance and Sexual Activity  Alcohol Use Not Currently     Social History   Substance and Sexual Activity  Drug Use Not Currently    Social History   Socioeconomic History   Marital status: Single    Spouse name: Not on file   Number of children: Not on file   Years of education: Not on file   Highest education level: Not on file  Occupational History   Not on file  Tobacco Use   Smoking status: Never    Passive exposure: Never   Smokeless tobacco: Never  Vaping Use   Vaping Use: Unknown  Substance and Sexual Activity   Alcohol use: Not Currently   Drug use: Not Currently   Sexual activity: Not Currently  Other Topics Concern   Not on file  Social History Narrative   Not on file   Social Determinants of Health   Financial Resource Strain: Not on file  Food Insecurity: Not on file   Transportation Needs: Not on file  Physical Activity: Not on file  Stress: Not on file  Social Connections: Not on file   Additional Social History:  Specify valuables returned:  (none)                      Sleep: Fair  Appetite:  Fair  Current Medications: Current Facility-Administered Medications  Medication Dose Route Frequency Provider Last Rate Last Admin   acetaminophen (TYLENOL) tablet 650 mg  650 mg Oral Q6H PRN Taylan Marez T, MD   650 mg at 02/07/23 0825   alum & mag hydroxide-simeth (MAALOX/MYLANTA) 200-200-20 MG/5ML suspension 30 mL  30 mL Oral Q4H PRN Stasia Somero T, MD   30 mL at 04/26/22 [id number]   aspirin EC tablet 81 mg  81 mg Oral Daily Emunah Texidor, [name], MD   81 mg at 02/07/23 0824   atorvastatin (LIPITOR) tablet 20 mg  20 mg Oral Daily Sahira Cataldi T, MD   20 mg at 02/07/23 0824   atropine 1 % ophthalmic solution 1 drop  1 drop Sublingual QID [name], MD   1 drop at 02/07/23 1220   benztropine (COGENTIN) tablet 0.5 mg  0.5 mg Oral BID Myisha Pickerel T, MD   0.5 mg at 02/07/23 0824   clonazePAM (KLONOPIN) tablet 1 mg  1 mg Oral TID PRN He, [name], MD   1 mg at  02/02/23 2125   cloZAPine (CLOZARIL) tablet 300 mg  300 mg Oral QHS Briceida Rasberry T, MD   300 mg at 02/06/23 2108   diphenhydrAMINE (BENADRYL) capsule 50 mg  50 mg Oral Q6H PRN Alinna Siple T, MD   50 mg at 01/28/23 2105   Or   diphenhydrAMINE (BENADRYL) injection 50 mg  50 mg Intramuscular Q6H PRN Lokelani Lutes T, MD   50 mg at 01/01/23 2252   feeding supplement (ENSURE ENLIVE / ENSURE PLUS) liquid 237 mL  237 mL Oral BID BM Girtha Kilgore T, MD   237 mL at 02/07/23 1000   gabapentin (NEURONTIN) capsule 300 mg  300 mg Oral TID Ilianna Bown T, MD   300 mg at 02/07/23 0900   haloperidol (HALDOL) tablet 2 mg  2 mg Oral TID Edvardo Honse T, MD   2 mg at 02/07/23 0900   haloperidol (HALDOL) tablet 5 mg  5 mg Oral Q6H PRN Jaimen Melone T, MD   5 mg at 01/23/23 1832   Or   haloperidol lactate (HALDOL)  injection 5 mg  5 mg Intramuscular Q6H PRN Alyna Stensland T, MD   5 mg at 01/01/23 2252   lisinopril (ZESTRIL) tablet 5 mg  5 mg Oral Daily Juliocesar Blasius T, MD   5 mg at 02/07/23 [id number]   magnesium hydroxide (MILK OF MAGNESIA) suspension 30 mL  30 mL Oral Daily PRN Yousof Alderman T, MD   30 mL at 01/20/23 2133   metoprolol succinate (TOPROL-XL) 24 hr tablet 25 mg  25 mg Oral Daily Athalie Newhard T, MD   25 mg at 02/07/23 0824   ondansetron (ZOFRAN) tablet 4 mg  4 mg Oral Q8H PRN [name], NP   4 mg at 09/06/22 2320   paliperidone (INVEGA SUSTENNA) injection 156 mg  156 mg Intramuscular Q28 days Celes Dedic T, MD   156 mg at 01/15/23 1652   paliperidone (INVEGA) 24 hr tablet 6 mg  6 mg Oral QHS Taline Nass T, MD   6 mg at 02/06/23 2108   senna-docusate (Senokot-S) tablet 2 tablet  2 tablet Oral QHS PRN [name], DO   2 tablet at 01/20/23 2133   temazepam (RESTORIL) capsule 15 mg  15 mg Oral QHS Jazz Rogala T, MD   15 mg at 02/06/23 2107   ziprasidone (GEODON) injection 20 mg  20 mg Intramuscular Q12H PRN Dorisann Schwanke, [name], MD   20 mg at 07/08/22 1453    Lab Results: No results found for this or any previous visit (from the past 48 hour(s)).  Blood Alcohol level:  Lab Results  Component Value Date   ETH <10 [id number]    Metabolic Disorder Labs: Lab Results  Component Value Date   HGBA1C 5.5 04/10/2022   MPG 111.15 04/10/2022   No results found for: "PROLACTIN" Lab Results  Component Value Date   CHOL 167 11/20/2021   TRIG 107 11/20/2021   HDL 26 (L) 11/20/2021   CHOLHDL 6.4 11/20/2021   VLDL 21 11/20/2021   LDLCALC 120 (H) 11/20/2021    Physical Findings: AIMS: Facial and Oral Movements Muscles of Facial Expression: None, normal Lips and Perioral Area: None, normal Jaw: None, normal Tongue: None, normal,Extremity Movements Upper (arms, wrists, hands, fingers): None, normal Lower (legs, knees, ankles, toes): None, normal, Trunk Movements Neck,  shoulders, hips: None, normal, Overall Severity Severity of abnormal movements (highest score from questions above): None, normal Incapacitation due to abnormal movements: None, normal Patient's awareness  of abnormal movements (rate only patient's report): No Awareness, Dental Status Current problems with teeth and/or dentures?: No Does patient usually wear dentures?: No  CIWA:    COWS:     Musculoskeletal: Strength & Muscle Tone: within normal limits Gait & Station: normal Patient leans: N/A  Psychiatric Specialty Exam:  Presentation  General Appearance:  Appropriate for Environment; Casual; Neat; Well Groomed  Eye Contact: Fair  Speech: Slow  Speech Volume: Decreased  Handedness: Left   Mood and Affect  Mood: Anxious  Affect: Congruent   Thought Process  Thought Processes: Goal Directed  Descriptions of Associations:Circumstantial  Orientation:Partial  Thought Content:Scattered  History of Schizophrenia/Schizoaffective disorder:Yes  Duration of Psychotic Symptoms:Greater than six months  Hallucinations:No data recorded Ideas of Reference:None  Suicidal Thoughts:No data recorded Homicidal Thoughts:No data recorded  Sensorium  Memory: Remote Good  Judgment: Fair  Insight: Fair   Community education officer  Concentration: Fair  Attention Span: Fair  Recall: [place] of Knowledge: Fair  Language: Fair   Psychomotor Activity  Psychomotor Activity:No data recorded  Assets  Assets:No data recorded  Sleep  Sleep:No data recorded   Physical Exam: Physical Exam Vitals reviewed.  Constitutional:      Appearance: Normal appearance.  HENT:     Head: Normocephalic and atraumatic.     Mouth/Throat:     Pharynx: Oropharynx is clear.  Eyes:     Pupils: Pupils are equal, round, and reactive to light.  Cardiovascular:     Rate and Rhythm: Normal rate and regular rhythm.  Pulmonary:     Effort: Pulmonary effort is normal.      Breath sounds: Normal breath sounds.  Abdominal:     General: Abdomen is flat.     Palpations: Abdomen is soft.  Musculoskeletal:        General: Normal range of motion.  Skin:    General: Skin is warm and dry.  Neurological:     General: No focal deficit present.     Mental Status: He is alert. Mental status is at baseline.  Psychiatric:        Attention and Perception: He is inattentive.        Mood and Affect: Affect is blunt.        Speech: He is noncommunicative. Speech is delayed.    Review of Systems  Unable to perform ROS: Language   Blood pressure 117/78, pulse 100, temperature 98 F (36.7 C), temperature source Oral, resp. rate 18, height '5\' 8"'$  (1.727 m), weight 85.8 kg, SpO2 100 %. Body mass index is 28.76 kg/m.   Treatment Plan Summary: Plan stable with no ongoing or active serious behavior problems.  No change to medication treatment.  Continue to focus on discharge planning.  Alethia Berthold, MD 02/07/2023, 1:45 PM

## 2023-02-07 NOTE — Progress Notes (Signed)
Patient pleasant this shift, still noted yelling loudly on unit while watching tv. Appropriate with staff and peers. Denies Si, Hi, AVH. Can be intrusive at times tonight but redirectable. Encouragement and support provided. Safety checks maintained. Medications given as prescribed. Pt receptive and remains safe on unit with q 15 min checks.

## 2023-02-07 NOTE — Plan of Care (Signed)
  Problem: Coping: Goal: Ability to identify and develop effective coping behavior will improve Outcome: Progressing   Problem: Self-Concept: Goal: Level of anxiety will decrease Outcome: Progressing   Problem: Safety: Goal: Ability to remain free from injury will improve Outcome: Progressing

## 2023-02-08 NOTE — Group Note (Unsigned)
Date:  02/08/2023 Time:  11:29 PM  Group Topic/Focus:  Coping With Mental Health Crisis:   The purpose of this group is to help patients identify strategies for coping with mental health crisis.  Group discusses possible causes of crisis and ways to manage them effectively.     Participation Level:  {BHH PARTICIPATION WO:[id number]  Participation Quality:  {BHH PARTICIPATION QUALITY:22265}  Affect:  {BHH AFFECT:22266}  Cognitive:  {BHH COGNITIVE:22267}  Insight: {BHH Insight2:20797}  Engagement in Group:  {BHH ENGAGEMENT IN BP:[id number]  Modes of Intervention:  {BHH MODES OF INTERVENTION:22269}  Additional Comments:  ***  Aamir Mclinden 02/08/2023, 11:29 PM

## 2023-02-08 NOTE — Progress Notes (Signed)
Patient denies SI, HI, and AVH. He is compliant with scheduled medications. He will occasionally yell out, but can be redirected. Patient remains safe on the unit at this time.

## 2023-02-08 NOTE — Group Note (Signed)
LCSW Group Therapy Note    Group Date: 02/08/2023 Start Time: 1400 End Time: 1500   Type of Therapy and Topic: Group Therapy: Body Image  Participation Level:  None  Description of Group:  Patients were educated about body image and asked to think about whether they have a healthy or unhealthy body image. Patients were led in a discussion about factors that contribute to body image, both internal and external. Patients were asked to discuss strengths of the human body outside of appearance, such as being able to fight off diseases and provide stress relief. Lastly, patients were asked to identify one way in which they appreciate their own body outside of appearance.   Therapeutic Goals:   1. Patient will differentiate between a healthy and unhealthy body image. 2. Patient will identify what contributes to body image 3. Patient will discuss the strengths of the human body. 4. Patient will identify a positive attribute of their body outside of physical appearance.  Summary of Patient Progress:  Patient was present in group.  Patient did not engage in group discussion.    Therapeutic Modalities: Cognitive Behavioral Therapy; Solution-Focused Therapy  Maryjane Hurter 02/08/2023  3:19 PM

## 2023-02-08 NOTE — BHH Counselor (Signed)
CSW assisted patient in having a continued screening with DSS.  DSS questioned why [name] referral was not completed.  CSW explained that at this time the patient is psych cleared.    DSS also wanted update on placement, which CSW provided.  Assunta Curtis, MSW, LCSW 02/08/2023 3:26 PM

## 2023-02-08 NOTE — BHH Group Notes (Signed)
[facility] Group Notes:  (Nursing/MHT/Case Management/Adjunct)  Date:  02/08/2023  Time:  9:18 PM  Type of Therapy:  Group Therapy  Participation Level:  Active  Participation Quality:  Inattentive  Affect:  Flat  Cognitive:  Alert and Confused  Insight:  Lacking  Engagement in Group:  Distracting  Modes of Intervention:  Discussion  Summary of Progress/Problems:  [name],Adam [name] 02/08/2023, 9:18 PM

## 2023-02-08 NOTE — Progress Notes (Signed)
[facility] MD Progress Note  02/08/2023 10:29 AM Adam [name]  MRN:  [id number] Subjective: No change to presentation.  No aggressive behavior and no dangerous behavior.  Completely stable. 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)  Total Time spent with patient: 30 minutes  Past Psychiatric History: Past history of schizophrenia and stroke  Past Medical History:  Past Medical History:  Diagnosis Date   Myocardial infarction ([place])    Schizophrenia ([place])    Stroke ([place])     Past Surgical History:  Procedure Laterality Date   NO PAST SURGERIES     Family History:  Family History  Problem Relation Age of Onset   Hypertension Mother    Family Psychiatric  History: See previous Social History:  Social History   Substance and Sexual Activity  Alcohol Use Not Currently     Social History   Substance and Sexual Activity  Drug Use Not Currently    Social History   Socioeconomic History   Marital status: Single    Spouse name: Not on file   Number of children: Not on file   Years of education: Not on file   Highest education level: Not on file  Occupational History   Not on file  Tobacco Use   Smoking status: Never    Passive exposure: Never   Smokeless tobacco: Never  Vaping Use   Vaping Use: Unknown  Substance and Sexual Activity   Alcohol use: Not Currently   Drug use: Not Currently   Sexual activity: Not Currently  Other Topics Concern   Not on file  Social History Narrative   Not on file   Social Determinants of Health    Financial Resource Strain: Not on file  Food Insecurity: Not on file  Transportation Needs: Not on file  Physical Activity: Not on file  Stress: Not on file  Social Connections: Not on file   Additional Social History:  Specify valuables returned:  (none)                      Sleep: Fair  Appetite:  Fair  Current Medications: Current Facility-Administered Medications  Medication Dose Route Frequency Provider Last Rate Last Admin   acetaminophen (TYLENOL) tablet 650 mg  650 mg Oral Q6H PRN Javarion Douty T, MD   650 mg at 02/07/23 0825   alum & mag hydroxide-simeth (MAALOX/MYLANTA) 200-200-20 MG/5ML suspension 30 mL  30 mL Oral Q4H PRN Eisen Robenson T, MD   30 mL at 04/26/22 [id number]   aspirin EC tablet 81 mg  81 mg Oral Daily Lattie Cervi T, MD   81 mg at 02/08/23 0845   atorvastatin (LIPITOR) tablet 20 mg  20 mg Oral Daily Lyndy Russman T, MD   20 mg at 02/08/23 0845   atropine 1 % ophthalmic solution 1 drop  1 drop Sublingual QID [name], MD   1 drop at 02/08/23 0850   benztropine (COGENTIN) tablet 0.5 mg  0.5 mg Oral BID Yassine Brunsman T, MD   0.5 mg at 02/08/23 0845   clonazePAM (KLONOPIN) tablet 1 mg  1 mg  Oral TID PRN He, [name], MD   1 mg at 02/02/23 2125   cloZAPine (CLOZARIL) tablet 300 mg  300 mg Oral QHS Quatisha Zylka T, MD   300 mg at 02/07/23 2108   diphenhydrAMINE (BENADRYL) capsule 50 mg  50 mg Oral Q6H PRN Dexton Zwilling T, MD   50 mg at 01/28/23 2105   Or   diphenhydrAMINE (BENADRYL) injection 50 mg  50 mg Intramuscular Q6H PRN Ayshia Gramlich T, MD   50 mg at 01/01/23 2252   feeding supplement (ENSURE ENLIVE / ENSURE PLUS) liquid 237 mL  237 mL Oral BID BM Citlali Gautney T, MD   237 mL at 02/07/23 1000   gabapentin (NEURONTIN) capsule 300 mg  300 mg Oral TID Renell Coaxum T, MD   300 mg at 02/08/23 0845   haloperidol (HALDOL) tablet 2 mg  2 mg Oral TID Muskaan Smet T, MD   2 mg at 02/08/23 0845   haloperidol (HALDOL) tablet 5 mg  5 mg Oral Q6H PRN Dekisha Mesmer,  Aviona Martenson T, MD   5 mg at 01/23/23 [id number]   Or   haloperidol lactate (HALDOL) injection 5 mg  5 mg Intramuscular Q6H PRN Elsbeth Yearick T, MD   5 mg at 01/01/23 2252   lisinopril (ZESTRIL) tablet 5 mg  5 mg Oral Daily Phoenicia Pirie T, MD   5 mg at 02/08/23 0845   magnesium hydroxide (MILK OF MAGNESIA) suspension 30 mL  30 mL Oral Daily PRN Shirla Hodgkiss T, MD   30 mL at 01/20/23 2133   metoprolol succinate (TOPROL-XL) 24 hr tablet 25 mg  25 mg Oral Daily Wendel Homeyer T, MD   25 mg at 02/08/23 0845   ondansetron (ZOFRAN) tablet 4 mg  4 mg Oral Q8H PRN [name], NP   4 mg at 09/06/22 2320   paliperidone (INVEGA SUSTENNA) injection 156 mg  156 mg Intramuscular Q28 days Dandra Velardi T, MD   156 mg at 01/15/23 1652   paliperidone (INVEGA) 24 hr tablet 6 mg  6 mg Oral QHS Lylee Corrow T, MD   6 mg at 02/07/23 2108   senna-docusate (Senokot-S) tablet 2 tablet  2 tablet Oral QHS PRN [name], DO   2 tablet at 01/20/23 2133   temazepam (RESTORIL) capsule 15 mg  15 mg Oral QHS Kaniah Rizzolo T, MD   15 mg at 02/07/23 2108   ziprasidone (GEODON) injection 20 mg  20 mg Intramuscular Q12H PRN Francile Woolford, [name], MD   20 mg at 07/08/22 1453    Lab Results: No results found for this or any previous visit (from the past 48 hour(s)).  Blood Alcohol level:  Lab Results  Component Value Date   ETH <10 A999333    Metabolic Disorder Labs: Lab Results  Component Value Date   HGBA1C 5.5 04/10/2022   MPG 111.15 04/10/2022   No results found for: "PROLACTIN" Lab Results  Component Value Date   CHOL 167 11/20/2021   TRIG 107 11/20/2021   HDL 26 (L) 11/20/2021   CHOLHDL 6.4 11/20/2021   VLDL 21 11/20/2021   LDLCALC 120 (H) 11/20/2021    Physical Findings: AIMS: Facial and Oral Movements Muscles of Facial Expression: None, normal Lips and Perioral Area: None, normal Jaw: None, normal Tongue: None, normal,Extremity Movements Upper (arms, wrists, hands, fingers): None, normal Lower  (legs, knees, ankles, toes): None, normal, Trunk Movements Neck, shoulders, hips: None, normal, Overall Severity Severity of abnormal movements (highest score from questions above):  None, normal Incapacitation due to abnormal movements: None, normal Patient's awareness of abnormal movements (rate only patient's report): No Awareness, Dental Status Current problems with teeth and/or dentures?: No Does patient usually wear dentures?: No  CIWA:    COWS:     Musculoskeletal: Strength & Muscle Tone: within normal limits Gait & Station: normal Patient leans: N/A  Psychiatric Specialty Exam:  Presentation  General Appearance:  Appropriate for Environment; Casual; Neat; Well Groomed  Eye Contact: Fair  Speech: Slow  Speech Volume: Decreased  Handedness: Left   Mood and Affect  Mood: Anxious  Affect: Congruent   Thought Process  Thought Processes: Goal Directed  Descriptions of Associations:Circumstantial  Orientation:Partial  Thought Content:Scattered  History of Schizophrenia/Schizoaffective disorder:Yes  Duration of Psychotic Symptoms:Greater than six months  Hallucinations:No data recorded Ideas of Reference:None  Suicidal Thoughts:No data recorded Homicidal Thoughts:No data recorded  Sensorium  Memory: Remote Good  Judgment: Fair  Insight: Fair   Community education officer  Concentration: Fair  Attention Span: Fair  Recall: [place] of Knowledge: Fair  Language: Fair   Psychomotor Activity  Psychomotor Activity:No data recorded  Assets  Assets:No data recorded  Sleep  Sleep:No data recorded   Physical Exam: Physical Exam Vitals reviewed.  Constitutional:      Appearance: Normal appearance.  HENT:     Head: Normocephalic and atraumatic.     Mouth/Throat:     Pharynx: Oropharynx is clear.  Eyes:     Pupils: Pupils are equal, round, and reactive to light.  Cardiovascular:     Rate and Rhythm: Normal rate and regular  rhythm.  Pulmonary:     Effort: Pulmonary effort is normal.     Breath sounds: Normal breath sounds.  Abdominal:     General: Abdomen is flat.     Palpations: Abdomen is soft.  Musculoskeletal:        General: Normal range of motion.  Skin:    General: Skin is warm and dry.  Neurological:     General: No focal deficit present.     Mental Status: He is alert. Mental status is at baseline.  Psychiatric:        Attention and Perception: He is inattentive.        Mood and Affect: Mood normal. Affect is blunt.        Speech: He is noncommunicative.    Review of Systems  Unable to perform ROS: Language   Blood pressure 107/77, pulse (!) 107, temperature 98 F (36.7 C), temperature source Oral, resp. rate 18, height '5\' 8"'$  (1.727 m), weight 85.8 kg, SpO2 100 %. Body mass index is 28.76 kg/m.   Treatment Plan Summary: Medication management and Plan no change to medication.  Patient appears stable.  Focus of treatment plan continues to be entirely on discharge  Alethia Berthold, MD 02/08/2023, 10:29 AM

## 2023-02-08 NOTE — Group Note (Signed)
Recreation Therapy Group Note   Group Topic:Leisure Education  Group Date: 02/08/2023 Start Time: 0945 End Time: 1115 Facilitators: Leona Carry, CTRS Location:  Craft Room  Group Description: Leisure. Patients were given the option to play [place], [place] or [place]. Pts decided to play [place] as a group. After multiple rounds, pts chose to also play [place] as a group. Pt's identified 2 of their leisure interests outside of the hospital and engaged in discussion on the importance of leisure activities in their daily life post-discharge.   Affect/Mood: Appropriate   Participation Level: Non-verbal   Participation Quality: N/A   Behavior: Preoccupied   Speech/Thought Process: N/A   Insight: N/A   Judgement: Limited   Modes of Intervention: Competitive Play   Patient Response to Interventions:  Disengaged   Education Outcome:  In group clarification offered    Clinical Observations/Individualized Feedback: [name] was not active in their participation of session activities and group discussion. Pt was in and out of group multiple times. Pt did not interact with peers or LRT at all while present in group.    Plan: Continue to engage patient in RT group sessions 2-3x/week.   [name], LRT, CTRS 02/08/2023 11:30 AM

## 2023-02-09 NOTE — Plan of Care (Signed)
D- Patient alert and oriented to person, place, and situation. Patient presents in a preoccupied, but pleasant mood on assessment stating that he slept ok last night and had no complaints to voice to this Probation officer. Patient denies SI, HI, AVH, and pain at this time. Patient also denies any signs/symptoms of depression and anxiety, by shaking his head and saying "no". Patient had no stated goals for today.  A- Scheduled medications administered to patient, per MD orders. Support and encouragement provided.  Routine safety checks conducted every 15 minutes.  Patient informed to notify staff with problems or concerns.  R- No adverse drug reactions noted. Patient contracts for safety at this time. Patient compliant with medications and treatment plan. Patient receptive, calm, and cooperative. Patient interacts well with others on the unit. Patient remains safe at this time.  Problem: Education: Goal: Ability to state activities that reduce stress will improve Outcome: Progressing   Problem: Coping: Goal: Ability to identify and develop effective coping behavior will improve Outcome: Progressing   Problem: Self-Concept: Goal: Ability to identify factors that promote anxiety will improve Outcome: Progressing Goal: Level of anxiety will decrease Outcome: Progressing Goal: Ability to modify response to factors that promote anxiety will improve Outcome: Progressing   Problem: Education: Goal: Knowledge of General Education information will improve Description: Including pain rating scale, medication(s)/side effects and non-pharmacologic comfort measures Outcome: Progressing   Problem: Health Behavior/Discharge Planning: Goal: Ability to manage health-related needs will improve Outcome: Progressing   Problem: Clinical Measurements: Goal: Ability to maintain clinical measurements within normal limits will improve Outcome: Progressing Goal: Will remain free from infection Outcome:  Progressing Goal: Diagnostic test results will improve Outcome: Progressing Goal: Respiratory complications will improve Outcome: Progressing Goal: Cardiovascular complication will be avoided Outcome: Progressing   Problem: Activity: Goal: Risk for activity intolerance will decrease Outcome: Progressing   Problem: Nutrition: Goal: Adequate nutrition will be maintained Outcome: Progressing   Problem: Coping: Goal: Level of anxiety will decrease Outcome: Progressing   Problem: Elimination: Goal: Will not experience complications related to bowel motility Outcome: Progressing Goal: Will not experience complications related to urinary retention Outcome: Progressing   Problem: Pain Managment: Goal: General experience of comfort will improve Outcome: Progressing   Problem: Safety: Goal: Ability to remain free from injury will improve Outcome: Progressing   Problem: Skin Integrity: Goal: Risk for impaired skin integrity will decrease Outcome: Progressing   Problem: Activity: Goal: Interest or engagement in leisure activities will improve Outcome: Progressing   Problem: Coping: Goal: Coping ability will improve Outcome: Progressing   Problem: Education: Goal: Will be free of psychotic symptoms Outcome: Progressing

## 2023-02-09 NOTE — [facility] Group Notes (Signed)
LCSW Group Therapy Note  02/09/2023 1:00pm  Type of Therapy/Topic:  Group Therapy:  Balance in Life  Participation Level:  Did Not Attend  Description of Group:    This group will address the concept of balance and how it feels and looks when one is unbalanced. Patients will be encouraged to process areas in their lives that are out of balance and identify reasons for remaining unbalanced. Facilitators will guide patients in utilizing problem-solving interventions to address and correct the stressor making their life unbalanced. Understanding and applying boundaries will be explored and addressed for obtaining and maintaining a balanced life. Patients will be encouraged to explore ways to assertively make their unbalanced needs known to significant others in their lives, using other group members and facilitator for support and feedback.  Therapeutic Goals: Patient will identify two or more emotions or situations they have that consume much of in their lives. Patient will identify signs/triggers that life has become out of balance:  Patient will identify two ways to set boundaries in order to achieve balance in their lives:  Patient will demonstrate ability to communicate their needs through discussion and/or role plays  Summary of Patient Progress: Maxence walked in a few times but was not disruptive and left. Was bringing pictures he had colored to one of the group members.       Therapeutic Modalities:   Cognitive Behavioral Therapy Solution-Focused Therapy Assertiveness Training  Deirdre Evener 02/09/2023 2:42 PM

## 2023-02-09 NOTE — Progress Notes (Signed)
No distress noted, affect is flat, interacting appropriately with peers and staff and denies SI/HI/AVH

## 2023-02-09 NOTE — Progress Notes (Signed)
[facility] MD Progress Note  02/09/2023 10:51 AM Adam [name]  MRN:  [id number] Subjective: Adam [name] is seen on rounds.  No changes.  He is pleasant and cooperative.  Has been compliant with medications.  Good controls.  Nurses report no issues. 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)  Total Time spent with patient: 15 minutes  Past Psychiatric History: CVA and schizophrenia  Past Medical History:  Past Medical History:  Diagnosis Date   Myocardial infarction ([place])    Schizophrenia ([place])    Stroke ([place])     Past Surgical History:  Procedure Laterality Date   NO PAST SURGERIES     Family History:  Family History  Problem Relation Age of Onset   Hypertension Mother    Family Psychiatric  History: Unremarkable Social History:  Social History   Substance and Sexual Activity  Alcohol Use Not Currently     Social History   Substance and Sexual Activity  Drug Use Not Currently    Social History   Socioeconomic History   Marital status: Single    Spouse name: Not on file   Number of children: Not on file   Years of education: Not on file   Highest education level: Not on file  Occupational History   Not on file  Tobacco Use   Smoking status: Never    Passive exposure: Never   Smokeless tobacco: Never  Vaping Use   Vaping Use: Unknown  Substance and Sexual Activity   Alcohol use: Not Currently   Drug use: Not Currently   Sexual activity: Not Currently  Other Topics Concern   Not on file  Social History Narrative   Not on file   Social  Determinants of Health   Financial Resource Strain: Not on file  Food Insecurity: Not on file  Transportation Needs: Not on file  Physical Activity: Not on file  Stress: Not on file  Social Connections: Not on file   Additional Social History:  Specify valuables returned:  (none)                      Sleep: Good  Appetite:  Good  Current Medications: Current Facility-Administered Medications  Medication Dose Route Frequency Provider Last Rate Last Admin   acetaminophen (TYLENOL) tablet 650 mg  650 mg Oral Q6H PRN [name], [name], MD   650 mg at 02/07/23 0825   alum & mag hydroxide-simeth (MAALOX/MYLANTA) 200-200-20 MG/5ML suspension 30 mL  30 mL Oral Q4H PRN [name], [name], MD   30 mL at 04/26/22 [id number]   aspirin EC tablet 81 mg  81 mg Oral Daily [name], [name], MD   81 mg at 02/09/23 0905   atorvastatin (LIPITOR) tablet 20 mg  20 mg Oral Daily [name], [name], MD   20 mg at 02/09/23 0905   atropine 1 % ophthalmic solution 1 drop  1 drop Sublingual QID [name], MD   1 drop at 02/09/23 0905   benztropine (COGENTIN) tablet 0.5 mg  0.5 mg Oral BID [name], [name], MD   0.5 mg at 02/09/23 [phone]  clonazePAM (KLONOPIN) tablet 1 mg  1 mg Oral TID PRN He, [name], MD   1 mg at 02/02/23 2125   cloZAPine (CLOZARIL) tablet 300 mg  300 mg Oral QHS [name], [name], MD   300 mg at 02/08/23 2116   diphenhydrAMINE (BENADRYL) capsule 50 mg  50 mg Oral Q6H PRN [name], [name], MD   50 mg at 01/28/23 2105   Or   diphenhydrAMINE (BENADRYL) injection 50 mg  50 mg Intramuscular Q6H PRN [name], [name], MD   50 mg at 01/01/23 2252   feeding supplement (ENSURE ENLIVE / ENSURE PLUS) liquid 237 mL  237 mL Oral BID BM [name], [name], MD   237 mL at 02/09/23 0906   gabapentin (NEURONTIN) capsule 300 mg  300 mg Oral TID [name], [name], MD   300 mg at 02/09/23 [id number]   haloperidol (HALDOL) tablet 2 mg  2 mg Oral TID [name], [name], MD   2 mg at 02/09/23 [id number]   haloperidol (HALDOL) tablet 5 mg  5 mg  Oral Q6H PRN [name], [name], MD   5 mg at 01/23/23 1832   Or   haloperidol lactate (HALDOL) injection 5 mg  5 mg Intramuscular Q6H PRN [name], [name], MD   5 mg at 01/01/23 2252   lisinopril (ZESTRIL) tablet 5 mg  5 mg Oral Daily [name], [name], MD   5 mg at 02/09/23 0905   magnesium hydroxide (MILK OF MAGNESIA) suspension 30 mL  30 mL Oral Daily PRN [name], [name], MD   30 mL at 01/20/23 2133   metoprolol succinate (TOPROL-XL) 24 hr tablet 25 mg  25 mg Oral Daily [name], [name], MD   25 mg at 02/09/23 0905   ondansetron (ZOFRAN) tablet 4 mg  4 mg Oral Q8H PRN [name], NP   4 mg at 09/06/22 2320   paliperidone (INVEGA SUSTENNA) injection 156 mg  156 mg Intramuscular Q28 days [name], [name], MD   156 mg at 01/15/23 1652   paliperidone (INVEGA) 24 hr tablet 6 mg  6 mg Oral QHS [name], [name], MD   6 mg at 02/08/23 2116   senna-docusate (Senokot-S) tablet 2 tablet  2 tablet Oral QHS PRN [name], DO   2 tablet at 01/20/23 2133   temazepam (RESTORIL) capsule 15 mg  15 mg Oral QHS [name], [name], MD   15 mg at 02/08/23 2116   ziprasidone (GEODON) injection 20 mg  20 mg Intramuscular Q12H PRN [name], [name], MD   20 mg at 07/08/22 1453    Lab Results: No results found for this or any previous visit (from the past 54 hour(s)).  Blood Alcohol level:  Lab Results  Component Value Date   ETH <10 [id number]    Metabolic Disorder Labs: Lab Results  Component Value Date   HGBA1C 5.5 04/10/2022   MPG 111.15 04/10/2022   No results found for: "PROLACTIN" Lab Results  Component Value Date   CHOL 167 11/20/2021   TRIG 107 11/20/2021   HDL 26 (L) 11/20/2021   CHOLHDL 6.4 11/20/2021   VLDL 21 11/20/2021   LDLCALC 120 (H) 11/20/2021    Physical Findings: AIMS: Facial and Oral Movements Muscles of Facial Expression: None, normal Lips and Perioral Area: None, normal Jaw: None, normal Tongue: None, normal,Extremity Movements Upper (arms, wrists, hands, fingers):  None, normal Lower (legs, knees, ankles, toes): None, normal, Trunk Movements Neck, shoulders, hips: None, normal, Overall Severity Severity  of abnormal movements (highest score from questions above): None, normal Incapacitation due to abnormal movements: None, normal Patient's awareness of abnormal movements (rate only patient's report): No Awareness, Dental Status Current problems with teeth and/or dentures?: No Does patient usually wear dentures?: No  CIWA:    COWS:     Musculoskeletal: Strength & Muscle Tone: within normal limits Gait & Station: normal Patient leans: N/A  Psychiatric Specialty Exam:  Presentation  General Appearance:  Appropriate for Environment; Casual; Neat; Well Groomed  Eye Contact: Fair  Speech: Slow  Speech Volume: Decreased  Handedness: Left   Mood and Affect  Mood: Anxious  Affect: Congruent   Thought Process  Thought Processes: Goal Directed  Descriptions of Associations:Circumstantial  Orientation:Partial  Thought Content:Scattered  History of Schizophrenia/Schizoaffective disorder:Yes  Duration of Psychotic Symptoms:Greater than six months  Hallucinations:No data recorded Ideas of Reference:None  Suicidal Thoughts:No data recorded Homicidal Thoughts:No data recorded  Sensorium  Memory: Remote Good  Judgment: Fair  Insight: Fair   Materials engineer: Fair  Attention Span: Fair  Recall: [place] of Knowledge: Fair  Language: Fair   Psychomotor Activity  Psychomotor Activity:No data recorded  Assets  Assets:No data recorded  Sleep  Sleep:No data recorded    Blood pressure 112/68, pulse 99, temperature 97.8 F (36.6 C), temperature source Oral, resp. rate 18, height '5\' 8"'$  (1.727 m), weight 85.8 kg, SpO2 100 %. Body mass index is 28.76 kg/m.   Treatment Plan Summary: Daily contact with patient to assess and evaluate symptoms and progress in treatment, Medication  management, and Plan continue current medications.  [name], DO 02/09/2023, 10:51 AM

## 2023-02-10 NOTE — Progress Notes (Signed)
Patient alert and oriented x 3, affect is flat but brightens upon approach,patient denies SI/HI/AVH, he appears less anxious, interacting appropriately with peers and staff and complaint with medication regimen. 15 minutes safety checks maintained will continue to monitor closely.

## 2023-02-10 NOTE — [facility] Group Notes (Signed)
LCSW Wellness Group Note   02/10/2023 12:45pm  Type of Group and Topic: Psychoeducational Group:  Wellness  Participation Level: none  Description of Group  Wellness group introduces the topic and its focus on developing healthy habits across the spectrum and its relationship to a decrease in hospital admissions.  Six areas of wellness are discussed: physical, social spiritual, intellectual, occupational, and emotional.  Patients are asked to consider their current wellness habits and to identify areas of wellness where they are interested and able to focus on improvements.    Therapeutic Goals Patients will understand components of wellness and how they can positively impact overall health.  Patients will identify areas of wellness where they have developed good habits. Patients will identify areas of wellness where they would like to make improvements.    Summary of Patient Progress: Adam [name] was present for the entire group and was not disruptive at all today.      Therapeutic Modalities: Cognitive Behavioral Therapy Psychoeducation    Joanne Chars, LCSW

## 2023-02-10 NOTE — Plan of Care (Signed)
Patient pleasant and cooperative on approach. No irritable behaviors noted.Appetite and energy level good. Tylenol given for R leg pain with good result. Support and encouragement given.

## 2023-02-10 NOTE — Progress Notes (Signed)
[facility] MD Progress Note  02/10/2023 11:05 AM Adam [name]  MRN:  [id number] Subjective: No changes with Adam [name].  He is pleasant and cooperative.  Has been compliant with his medications.  Nurses report no issues.  He has no complaints. 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)  Total Time spent with patient: 15 minutes  Past Psychiatric History: CVA and schizophrenia  Past Medical History:  Past Medical History:  Diagnosis Date   Myocardial infarction ([place])    Schizophrenia ([place])    Stroke ([place])     Past Surgical History:  Procedure Laterality Date   NO PAST SURGERIES     Family History:  Family History  Problem Relation Age of Onset   Hypertension Mother    Family Psychiatric  History: Unremarkable Social History:  Social History   Substance and Sexual Activity  Alcohol Use Not Currently     Social History   Substance and Sexual Activity  Drug Use Not Currently    Social History   Socioeconomic History   Marital status: Single    Spouse name: Not on file   Number of children: Not on file   Years of education: Not on file   Highest education level: Not on file  Occupational History   Not on file  Tobacco Use   Smoking status: Never    Passive exposure: Never   Smokeless tobacco: Never  Vaping Use   Vaping Use: Unknown  Substance and Sexual Activity   Alcohol use: Not Currently   Drug use: Not Currently   Sexual activity: Not Currently  Other Topics Concern   Not on file  Social History Narrative   Not on file   Social  Determinants of Health   Financial Resource Strain: Not on file  Food Insecurity: Not on file  Transportation Needs: Not on file  Physical Activity: Not on file  Stress: Not on file  Social Connections: Not on file   Additional Social History:  Specify valuables returned:  (none)                      Sleep: Good  Appetite:  Good  Current Medications: Current Facility-Administered Medications  Medication Dose Route Frequency Provider Last Rate Last Admin   acetaminophen (TYLENOL) tablet 650 mg  650 mg Oral Q6H PRN [name], [name], MD   650 mg at 02/07/23 0825   alum & mag hydroxide-simeth (MAALOX/MYLANTA) 200-200-20 MG/5ML suspension 30 mL  30 mL Oral Q4H PRN [name], [name], MD   30 mL at 04/26/22 [id number]   aspirin EC tablet 81 mg  81 mg Oral Daily [name], [name], MD   81 mg at 02/10/23 0810   atorvastatin (LIPITOR) tablet 20 mg  20 mg Oral Daily [name], [name], MD   20 mg at 02/10/23 0811   atropine 1 % ophthalmic solution 1 drop  1 drop Sublingual QID [name], MD   1 drop at 02/10/23 0810   benztropine (COGENTIN) tablet 0.5 mg  0.5 mg Oral BID [name], [name], MD   0.5 mg at 02/10/23 [phone]  clonazePAM (KLONOPIN) tablet 1 mg  1 mg Oral TID PRN He, [name], MD   1 mg at 02/02/23 2125   cloZAPine (CLOZARIL) tablet 300 mg  300 mg Oral QHS [name], [name], MD   300 mg at 02/09/23 2113   diphenhydrAMINE (BENADRYL) capsule 50 mg  50 mg Oral Q6H PRN [name], [name], MD   50 mg at 01/28/23 2105   Or   diphenhydrAMINE (BENADRYL) injection 50 mg  50 mg Intramuscular Q6H PRN [name], [name], MD   50 mg at 01/01/23 2252   feeding supplement (ENSURE ENLIVE / ENSURE PLUS) liquid 237 mL  237 mL Oral BID BM [name], [name], MD   237 mL at 02/10/23 1016   gabapentin (NEURONTIN) capsule 300 mg  300 mg Oral TID [name], [name], MD   300 mg at 02/10/23 1016   haloperidol (HALDOL) tablet 2 mg  2 mg Oral TID [name], [name], MD   2 mg at 02/10/23 1016   haloperidol (HALDOL) tablet 5 mg  5 mg  Oral Q6H PRN [name], [name], MD   5 mg at 01/23/23 1832   Or   haloperidol lactate (HALDOL) injection 5 mg  5 mg Intramuscular Q6H PRN [name], [name], MD   5 mg at 01/01/23 2252   lisinopril (ZESTRIL) tablet 5 mg  5 mg Oral Daily [name], [name], MD   5 mg at 02/10/23 0810   magnesium hydroxide (MILK OF MAGNESIA) suspension 30 mL  30 mL Oral Daily PRN [name], [name], MD   30 mL at 01/20/23 2133   metoprolol succinate (TOPROL-XL) 24 hr tablet 25 mg  25 mg Oral Daily [name], [name], MD   25 mg at 02/10/23 0810   ondansetron (ZOFRAN) tablet 4 mg  4 mg Oral Q8H PRN [name], NP   4 mg at 09/06/22 2320   paliperidone (INVEGA SUSTENNA) injection 156 mg  156 mg Intramuscular Q28 days [name], [name], MD   156 mg at 01/15/23 1652   paliperidone (INVEGA) 24 hr tablet 6 mg  6 mg Oral QHS [name], [name], MD   6 mg at 02/09/23 2113   senna-docusate (Senokot-S) tablet 2 tablet  2 tablet Oral QHS PRN [name], DO   2 tablet at 01/20/23 2133   temazepam (RESTORIL) capsule 15 mg  15 mg Oral QHS [name], [name], MD   15 mg at 02/09/23 2112   ziprasidone (GEODON) injection 20 mg  20 mg Intramuscular Q12H PRN [name], [name], MD   20 mg at 07/08/22 1453    Lab Results: No results found for this or any previous visit (from the past 48 hour(s)).  Blood Alcohol level:  Lab Results  Component Value Date   ETH <10 [id number]    Metabolic Disorder Labs: Lab Results  Component Value Date   HGBA1C 5.5 04/10/2022   MPG 111.15 04/10/2022   No results found for: "PROLACTIN" Lab Results  Component Value Date   CHOL 167 11/20/2021   TRIG 107 11/20/2021   HDL 26 (L) 11/20/2021   CHOLHDL 6.4 11/20/2021   VLDL 21 11/20/2021   LDLCALC 120 (H) 11/20/2021    Physical Findings: AIMS: Facial and Oral Movements Muscles of Facial Expression: None, normal Lips and Perioral Area: None, normal Jaw: None, normal Tongue: None, normal,Extremity Movements Upper (arms, wrists, hands, fingers):  None, normal Lower (legs, knees, ankles, toes): None, normal, Trunk Movements Neck, shoulders, hips: None, normal, Overall Severity Severity  of abnormal movements (highest score from questions above): None, normal Incapacitation due to abnormal movements: None, normal Patient's awareness of abnormal movements (rate only patient's report): No Awareness, Dental Status Current problems with teeth and/or dentures?: No Does patient usually wear dentures?: No  CIWA:    COWS:     Musculoskeletal: Strength & Muscle Tone: within normal limits Gait & Station: normal Patient leans: N/A  Psychiatric Specialty Exam:  Presentation  General Appearance:  Appropriate for Environment; Casual; Neat; Well Groomed  Eye Contact: Fair  Speech: Slow  Speech Volume: Decreased  Handedness: Left   Mood and Affect  Mood: Anxious  Affect: Congruent   Thought Process  Thought Processes: Goal Directed  Descriptions of Associations:Circumstantial  Orientation:Partial  Thought Content:Scattered  History of Schizophrenia/Schizoaffective disorder:Yes  Duration of Psychotic Symptoms:Greater than six months  Hallucinations:No data recorded Ideas of Reference:None  Suicidal Thoughts:No data recorded Homicidal Thoughts:No data recorded  Sensorium  Memory: Remote Good  Judgment: Fair  Insight: Fair   Materials engineer: Fair  Attention Span: Fair  Recall: [place] of Knowledge: Fair  Language: Fair   Psychomotor Activity  Psychomotor Activity:No data recorded  Assets  Assets:No data recorded  Sleep  Sleep:No data recorded    Blood pressure (!) 144/95, pulse 92, temperature 97.8 F (36.6 C), temperature source Oral, resp. rate 18, height '5\' 8"'$  (1.727 m), weight 85.8 kg, SpO2 100 %. Body mass index is 28.76 kg/m.   Treatment Plan Summary: Daily contact with patient to assess and evaluate symptoms and progress in treatment, Medication  management, and Plan continue current medications.  [place], DO 02/10/2023, 11:05 AM

## 2023-02-10 NOTE — [facility] Group Notes (Signed)
[place] Group Notes:  (Nursing/MHT/Case Management/Adjunct)  Date:  02/10/2023  Time:  7:14 PM  Type of Therapy:  Psychoeducational Skills  Participation Level:  Active  Participation Quality:  Appropriate  Affect:  Appropriate  Cognitive:  Appropriate  Insight:  Appropriate  Engagement in Group:  Engaged  Modes of Intervention:  Activity  Summary of Progress/Problems:  Adela Lank [facility] 02/10/2023, 7:14 PM

## 2023-02-11 NOTE — Group Note (Signed)
Recreation Therapy Group Note   Group Topic:Goal Setting  Group Date: 02/11/2023 Start Time: 1000 End Time: 1055 Facilitators: [name], LRT, CTRS Location:  Craft Room  Group Description: Scientist, physiological. Patients were given many different magazines, a glue stick, markers, and a piece of cardstock paper. LRT and pts discussed the importance of having goals in life. LRT and pts discussed the difference between short-term and long-term goals. LRT encouraged pts to create a vision board, with images they picked and then cut out by LRT from the magazine, for themselves that capture their short and long-term goals they have for themselves. On the back of the paper, pt encouraged to write 3 different coping skills that can help them reach those goals. LRT encouraged pts to show and explain their vision board to the group once complete. LRT offered to laminate vision board once dry and complete.   Affect/Mood: Full range   Participation Level: Active   Participation Quality: Minimal Cues   Behavior: Agitated, Alert, and Cooperative   Speech/Thought Process: Disorganized   Insight: Limited   Judgement: Limited   Modes of Intervention: Activity and Education   Patient Response to Interventions:  Interested  and Receptive   Education Outcome:  In group clarification offered    Clinical Observations/Individualized Feedback: Adam [name] was mostly active in their participation of session activities and group discussion. Pt ripped out different pictures from the magazine and glued them on his paper. Pt allowed LRT to assist him with twisting the glue stick allowing more to come up. Pt became agitated in group yelling "No! No! No!" Many times in response to a peer calling him "buddy". Pt continued to give peer mean looks throughout session, however was not a distraction. Pt was able to self regulate, calm down and continue working on activity with no problem. Pt declined the opportunity to share  artwork when encouraged.  Plan: Continue to engage patient in RT group sessions 2-3x/week.   [name], LRT, CTRS 02/11/2023 11:15 AM

## 2023-02-11 NOTE — Progress Notes (Signed)
This writer assumed care of pt at 1pm. Patient has been sitting in the dayroom watching TV. No behavioral issues observed.

## 2023-02-11 NOTE — Progress Notes (Signed)
[facility] MD Progress Note  02/11/2023 2:25 PM Adam [name]  MRN:  [id number] Subjective: Follow-up 62 year old man with schizophrenia.  No change to behavior.  No problems with behavior.  No change to symptoms. 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)  Total Time spent with patient: 20 minutes  Past Psychiatric History: Past history of schizophrenia  Past Medical History:  Past Medical History:  Diagnosis Date   Myocardial infarction ([place])    Schizophrenia ([place])    Stroke ([place])     Past Surgical History:  Procedure Laterality Date   NO PAST SURGERIES     Family History:  Family History  Problem Relation Age of Onset   Hypertension Mother    Family Psychiatric  History: See previous Social History:  Social History   Substance and Sexual Activity  Alcohol Use Not Currently     Social History   Substance and Sexual Activity  Drug Use Not Currently    Social History   Socioeconomic History   Marital status: Single    Spouse name: Not on file   Number of children: Not on file   Years of education: Not on file   Highest education level: Not on file  Occupational History   Not on file  Tobacco Use   Smoking status: Never    Passive exposure: Never   Smokeless tobacco: Never  Vaping Use   Vaping Use: Unknown  Substance and Sexual Activity   Alcohol use: Not Currently   Drug use: Not Currently   Sexual activity: Not Currently  Other Topics Concern   Not on file  Social History Narrative   Not on file   Social Determinants of  Health   Financial Resource Strain: Not on file  Food Insecurity: Not on file  Transportation Needs: Not on file  Physical Activity: Not on file  Stress: Not on file  Social Connections: Not on file   Additional Social History:  Specify valuables returned:  (none)                      Sleep: Fair  Appetite:  Fair  Current Medications: Current Facility-Administered Medications  Medication Dose Route Frequency Provider Last Rate Last Admin   acetaminophen (TYLENOL) tablet 650 mg  650 mg Oral Q6H PRN Shakeem Stern T, MD   650 mg at 02/10/23 1247   alum & mag hydroxide-simeth (MAALOX/MYLANTA) 200-200-20 MG/5ML suspension 30 mL  30 mL Oral Q4H PRN Brieanne Mignone T, MD   30 mL at 04/26/22 [id number]   aspirin EC tablet 81 mg  81 mg Oral Daily Maddelyn Rocca, [name], MD   81 mg at 02/11/23 0908   atorvastatin (LIPITOR) tablet 20 mg  20 mg Oral Daily Suhaib Guzzo T, MD   20 mg at 02/11/23 0908   atropine 1 % ophthalmic solution 1 drop  1 drop Sublingual QID [name], MD   1 drop at 02/11/23 1159   benztropine (COGENTIN) tablet 0.5 mg  0.5 mg Oral BID Verdis Koval T, MD   0.5 mg at 02/11/23 0908   clonazePAM (KLONOPIN) tablet 1 mg  1 mg Oral TID PRN He, [name], MD   1 mg at 02/02/23 2125   cloZAPine (CLOZARIL) tablet 300 mg  300 mg Oral QHS Hollister Wessler T, MD   300 mg at 02/10/23 2106   diphenhydrAMINE (BENADRYL) capsule 50 mg  50 mg Oral Q6H PRN Adoria Kawamoto T, MD   50 mg at 01/28/23 2105   Or   diphenhydrAMINE (BENADRYL) injection 50 mg  50 mg Intramuscular Q6H PRN Haden Suder T, MD   50 mg at 01/01/23 2252   feeding supplement (ENSURE ENLIVE / ENSURE PLUS) liquid 237 mL  237 mL Oral BID BM Chanteria Haggard T, MD   237 mL at 02/11/23 [id number]   gabapentin (NEURONTIN) capsule 300 mg  300 mg Oral TID Michail Boyte T, MD   300 mg at 02/11/23 [id number]   haloperidol (HALDOL) tablet 2 mg  2 mg Oral TID Eisen Robenson, [name], MD   2 mg at 02/11/23 [id number]   haloperidol (HALDOL) tablet 5 mg  5 mg Oral Q6H PRN  Gaytha Raybourn T, MD   5 mg at 01/23/23 1832   Or   haloperidol lactate (HALDOL) injection 5 mg  5 mg Intramuscular Q6H PRN Emonni Depasquale T, MD   5 mg at 01/01/23 2252   lisinopril (ZESTRIL) tablet 5 mg  5 mg Oral Daily Hang Ammon T, MD   5 mg at 02/11/23 0908   magnesium hydroxide (MILK OF MAGNESIA) suspension 30 mL  30 mL Oral Daily PRN Saory Carriero T, MD   30 mL at 01/20/23 2133   metoprolol succinate (TOPROL-XL) 24 hr tablet 25 mg  25 mg Oral Daily Xavian Hardcastle T, MD   25 mg at 02/11/23 0907   ondansetron (ZOFRAN) tablet 4 mg  4 mg Oral Q8H PRN [name], NP   4 mg at 09/06/22 2320   paliperidone (INVEGA SUSTENNA) injection 156 mg  156 mg Intramuscular Q28 days Shyne Resch T, MD   156 mg at 01/15/23 1652   paliperidone (INVEGA) 24 hr tablet 6 mg  6 mg Oral QHS Makenzey Nanni T, MD   6 mg at 02/10/23 2106   senna-docusate (Senokot-S) tablet 2 tablet  2 tablet Oral QHS PRN [name], DO   2 tablet at 01/20/23 2133   temazepam (RESTORIL) capsule 15 mg  15 mg Oral QHS Shiva Sahagian T, MD   15 mg at 02/10/23 2105   ziprasidone (GEODON) injection 20 mg  20 mg Intramuscular Q12H PRN Devoiry Corriher, [name], MD   20 mg at 07/08/22 1453    Lab Results: No results found for this or any previous visit (from the past 36 hour(s)).  Blood Alcohol level:  Lab Results  Component Value Date   ETH <10 [id number]    Metabolic Disorder Labs: Lab Results  Component Value Date   HGBA1C 5.5 04/10/2022   MPG 111.15 04/10/2022   No results found for: "PROLACTIN" Lab Results  Component Value Date   CHOL 167 11/20/2021   TRIG 107 11/20/2021   HDL 26 (L) 11/20/2021   CHOLHDL 6.4 11/20/2021   VLDL 21 11/20/2021   LDLCALC 120 (H) 11/20/2021    Physical Findings: AIMS: Facial and Oral Movements Muscles of Facial Expression: None, normal Lips and Perioral Area: None, normal Jaw: None, normal Tongue: None, normal,Extremity Movements Upper (arms, wrists, hands, fingers): None,  normal Lower (legs, knees, ankles, toes): None, normal, Trunk Movements Neck, shoulders, hips: None, normal, Overall Severity Severity of abnormal movements (highest score from  questions above): None, normal Incapacitation due to abnormal movements: None, normal Patient's awareness of abnormal movements (rate only patient's report): No Awareness, Dental Status Current problems with teeth and/or dentures?: No Does patient usually wear dentures?: No  CIWA:    COWS:     Musculoskeletal: Strength & Muscle Tone: within normal limits Gait & Station: normal Patient leans: N/A  Psychiatric Specialty Exam:  Presentation  General Appearance:  Appropriate for Environment; Casual; Neat; Well Groomed  Eye Contact: Fair  Speech: Slow  Speech Volume: Decreased  Handedness: Left   Mood and Affect  Mood: Anxious  Affect: Congruent   Thought Process  Thought Processes: Goal Directed  Descriptions of Associations:Circumstantial  Orientation:Partial  Thought Content:Scattered  History of Schizophrenia/Schizoaffective disorder:Yes  Duration of Psychotic Symptoms:Greater than six months  Hallucinations:No data recorded Ideas of Reference:None  Suicidal Thoughts:No data recorded Homicidal Thoughts:No data recorded  Sensorium  Memory: Remote Good  Judgment: Fair  Insight: Fair   Community education officer  Concentration: Fair  Attention Span: Fair  Recall: [place] of Knowledge: Fair  Language: Fair   Psychomotor Activity  Psychomotor Activity:No data recorded  Assets  Assets:No data recorded  Sleep  Sleep:No data recorded   Physical Exam: Physical Exam Vitals reviewed.  Constitutional:      Appearance: Normal appearance.  HENT:     Head: Normocephalic and atraumatic.     Mouth/Throat:     Pharynx: Oropharynx is clear.  Eyes:     Pupils: Pupils are equal, round, and reactive to light.  Cardiovascular:     Rate and Rhythm: Normal rate  and regular rhythm.  Pulmonary:     Effort: Pulmonary effort is normal.     Breath sounds: Normal breath sounds.  Abdominal:     General: Abdomen is flat.     Palpations: Abdomen is soft.  Musculoskeletal:        General: Normal range of motion.  Skin:    General: Skin is warm and dry.  Neurological:     General: No focal deficit present.     Mental Status: He is alert. Mental status is at baseline.  Psychiatric:        Attention and Perception: He is inattentive.        Mood and Affect: Mood normal. Affect is blunt.        Speech: He is noncommunicative.    Review of Systems  Unable to perform ROS: Language   Blood pressure 114/79, pulse (!) 107, temperature 97.8 F (36.6 C), temperature source Oral, resp. rate 18, height '5\' 8"'$  (1.727 m), weight 85.8 kg, SpO2 100 %. Body mass index is 28.76 kg/m.   Treatment Plan Summary: Medication management and Plan stable.  No change to treatment plan.  Continue to focus on discharge planning  Alethia Berthold, MD 02/11/2023, 2:25 PM

## 2023-02-11 NOTE — BHH Group Notes (Signed)
[place] Group Notes:  (Nursing/MHT/Case Management/Adjunct)  Date:  02/11/2023  Time:  9:34 PM  Type of Therapy:   Wrap up  Participation Level:  Did Not Attend    Summary of Progress/Problems:  Adam [name] 02/11/2023, 9:34 PM

## 2023-02-11 NOTE — BHH Group Notes (Signed)
[place] Group Notes:  (Nursing/MHT/Case Management/Adjunct)  Date:  02/11/2023  Time:  3:23 PM  Type of Therapy:  Psychoeducational Skills  Participation Level:  Did Not Attend   Adela Lank [facility] 02/11/2023, 3:23 PM

## 2023-02-11 NOTE — BHH Counselor (Signed)
CSW attempted to contact Ila with [facility].  CSW left HIPAA compliant voicemail.  Assunta Curtis, MSW, LCSW 02/11/2023 3:05 PM

## 2023-02-11 NOTE — Progress Notes (Signed)
Patient alert and oriented x 3 with periods of confusion to situation, he was receptive to staff and interacting appropriately with peers. He denies SI/HI/AVH no distress noted, 15 minutes safety checks maintained.

## 2023-02-11 NOTE — Group Note (Signed)
[facility] LCSW Group Therapy Note    Group Date: 02/11/2023 Start Time: 1300 End Time: 1400  Type of Therapy and Topic:  Group Therapy:  Overcoming Obstacles  Participation Level:  BHH PARTICIPATION LEVEL: Did Not Attend   Description of Group:   In this group patients will be encouraged to explore what they see as obstacles to their own wellness and recovery. They will be guided to discuss their thoughts, feelings, and behaviors related to these obstacles. The group will process together ways to cope with barriers, with attention given to specific choices patients can make. Each patient will be challenged to identify changes they are motivated to make in order to overcome their obstacles. This group will be process-oriented, with patients participating in exploration of their own experiences as well as giving and receiving support and challenge from other group members.  Therapeutic Goals: 1. Patient will identify personal and current obstacles as they relate to admission. 2. Patient will identify barriers that currently interfere with their wellness or overcoming obstacles.  3. Patient will identify feelings, thought process and behaviors related to these barriers. 4. Patient will identify two changes they are willing to make to overcome these obstacles:    Summary of Patient Progress X   Therapeutic Modalities:   Cognitive Behavioral Therapy Solution Focused Therapy Motivational Interviewing Relapse Prevention Therapy   Shirl Harris, LCSW

## 2023-02-12 MED ORDER — TUBERCULIN PPD 5 UNIT/0.1ML ID SOLN
5.0000 [IU] | Freq: Once | INTRADERMAL | Status: AC
  Administered 2023-02-12: 5 [IU] via INTRADERMAL
  Filled 2023-02-12: qty 0.1

## 2023-02-12 NOTE — Group Note (Signed)
Recreation Therapy Group Note   Group Topic:Healthy Support Systems  Group Date: 02/12/2023 Start Time: 1000 End Time: 1055 Facilitators: [name], LRT, CTRS Location:  Craft Room  Group Description: Straw Bridge. Patients were given 10 plastic drinking straws and an equal length of masking tape. Using the materials provided, patients were instructed to build a free-standing bridge-like structure to suspend an everyday item (ex: deck of cards) off the floor or table surface. All materials were required to be used in Conservation officer, nature. LRT facilitated post-activity discussion reviewing the importance of having strong and healthy support systems in our lives. LRT discussed how the people in our lives serve as the tape and the book we placed on top of our straw structure are the stressors we face in daily life. LRT and pts discussed what happens in our life when things get too heavy for [place], and we don't have strong supports outside of the hospital. Pt shared 2 of their healthy supports aloud in the group.   Affect/Mood: Full range   Participation Level: Minimal   Participation Quality: Minimal Cues   Behavior: Distracted   Speech/Thought Process: Incoherent   Insight: Limited   Judgement: Limited   Modes of Intervention: Activity and Guided Discussion   Patient Response to Interventions:  Receptive   Education Outcome:  In group clarification offered    Clinical Observations/Individualized Feedback: Devanta was minimally in their participation of session activities and group discussion. Pt was present in group for about 20 minutes before getting up and leaving the room. Pt sat still and tapped a few straws together before leaving the room. Pt did not return.   Plan: Continue to engage patient in RT group sessions 2-3x/week.   [name], LRT, CTRS 02/12/2023 11:11 AM

## 2023-02-12 NOTE — Progress Notes (Signed)
D- Patient alert and oriented x 3-4. Affect animated/mood euthymic. Denies SI/ HI,/AVH. He denies pain. Planning for discharge to ALF and [place] '156mg'$   and TB skin test ordered and administered without difficulty or adverse reactions. A- Scheduled medications administered to patient, per MD orders. Support and encouragement provided.  Routine safety checks conducted every 15 minutes.  Patient informed to notify staff with problem or concerns.  Patient compliant with medications and treatment plan. Patient receptive and cooperative and interacts well with others on the unit.  Patient contracts for safety and remains safe on the unit at this time.

## 2023-02-12 NOTE — Progress Notes (Signed)
Pt displayed irritable boisterous behavior when someone did not recognize the subject of a photo shown, began yelling in frustration and refusing medications.  Staff worked with pt to calm, regard needs.  Pt calmed and eventually took his nighttime meds and went to bed.  Continued monitoring for safety.  02/12/23 0400  Psych Admission Type (Psych Patients Only)  Admission Status Involuntary  Psychosocial Assessment  Patient Complaints None  Eye Contact Fair  Facial Expression Animated  Affect Preoccupied  Speech Aphasic  Interaction Childlike  Motor Activity Shuffling  Appearance/Hygiene Unremarkable  Behavior Characteristics Cooperative;Agitated  Mood Preoccupied  Thought Process  Coherency WDL  Content WDL  Delusions None reported or observed  Perception WDL  Hallucination None reported or observed  Judgment Limited  Confusion WDL  Danger to Self  Current suicidal ideation? Denies  Danger to Others  Danger to Others None reported or observed  Danger to Others Abnormal  Harmful Behavior to others No threats or harm toward other people  Destructive Behavior No threats or harm toward property

## 2023-02-12 NOTE — Plan of Care (Signed)
  Problem: Education: Goal: Ability to state activities that reduce stress will improve Outcome: Not Progressing   Problem: Coping: Goal: Ability to identify and develop effective coping behavior will improve Outcome: Not Progressing   Problem: Self-Concept: Goal: Ability to identify factors that promote anxiety will improve Outcome: Not Progressing Goal: Level of anxiety will decrease Outcome: Not Progressing Goal: Ability to modify response to factors that promote anxiety will improve Outcome: Not Progressing   Problem: Education: Goal: Knowledge of General Education information will improve Description: Including pain rating scale, medication(s)/side effects and non-pharmacologic comfort measures Outcome: Not Progressing   Problem: Health Behavior/Discharge Planning: Goal: Ability to manage health-related needs will improve Outcome: Not Progressing   Problem: Clinical Measurements: Goal: Ability to maintain clinical measurements within normal limits will improve Outcome: Not Progressing Goal: Will remain free from infection Outcome: Not Progressing Goal: Diagnostic test results will improve Outcome: Not Progressing Goal: Respiratory complications will improve Outcome: Not Progressing Goal: Cardiovascular complication will be avoided Outcome: Not Progressing   Problem: Activity: Goal: Risk for activity intolerance will decrease Outcome: Not Progressing   Problem: Nutrition: Goal: Adequate nutrition will be maintained Outcome: Not Progressing   Problem: Coping: Goal: Level of anxiety will decrease Outcome: Not Progressing   Problem: Elimination: Goal: Will not experience complications related to bowel motility Outcome: Not Progressing Goal: Will not experience complications related to urinary retention Outcome: Not Progressing   Problem: Pain Managment: Goal: General experience of comfort will improve Outcome: Not Progressing   Problem: Safety: Goal:  Ability to remain free from injury will improve Outcome: Not Progressing   Problem: Skin Integrity: Goal: Risk for impaired skin integrity will decrease Outcome: Not Progressing   Problem: Activity: Goal: Interest or engagement in leisure activities will improve Outcome: Not Progressing   Problem: Coping: Goal: Coping ability will improve Outcome: Not Progressing   Problem: Education: Goal: Will be free of psychotic symptoms Outcome: Not Progressing   

## 2023-02-12 NOTE — Group Note (Signed)
[place] LCSW Group Therapy Note   Group Date: 02/12/2023 Start Time: 1300 End Time: 1400   Type of Therapy/Topic:  Group Therapy:  Emotion Regulation  Participation Level:  None   Description of Group:    The purpose of this group is to assist patients in learning to regulate negative emotions and experience positive emotions. Patients will be guided to discuss ways in which they have been vulnerable to their negative emotions. These vulnerabilities will be juxtaposed with experiences of positive emotions or situations, and patients challenged to use positive emotions to combat negative ones. Special emphasis will be placed on coping with negative emotions in conflict situations, and patients will process healthy conflict resolution skills.  Therapeutic Goals: Patient will identify two positive emotions or experiences to reflect on in order to balance out negative emotions:  Patient will label two or more emotions that they find the most difficult to experience:  Patient will be able to demonstrate positive conflict resolution skills through discussion or role plays:   Summary of Patient Progress: Patient is unable to participate in group due to expressive aphasia.    Therapeutic Modalities:   Cognitive Behavioral Therapy Feelings Identification Dialectical Behavioral Therapy   [name], [place]

## 2023-02-12 NOTE — BHH Counselor (Signed)
CSW spoke with patient's mother with [facility] Supervisor Oretha Caprice via conference call.  CSW updated mother on report from [place] from [place].  Per report the patient will have a full sized bed in his private room.  Home is nonsmoking.  Patient can have TV if he wishes.   Mother plans on getting phone and tablet for patient.  Mother reports that patient has a TV of his own already.  CSW also updated mother that the home has 24/7 supervision with door alarms. CSW also reported that when at the day program patient will be monitored and if he leaves the room he'll have staff with him.  Mom reported that she has spoken with [facility].  Mom was open to having patient transition to this home.  Mom is aware of the 3500 cost.  Mom agreed to use patients 809 and 1118 he receives monthly towards placement and to use the 21000 he has in the bank to cover any gaps.   Mom was adamant that special funding be looked into.  CSW provided mother the name of the group home owner and her contact information for follow ups.  Assunta Curtis, MSW, LCSW 02/12/2023 4:05 PM

## 2023-02-12 NOTE — Progress Notes (Signed)
[facility] MD Progress Note  02/12/2023 4:23 PM Adam [name]  MRN:  [id number] Subjective: Follow-up for this 62 year old man with schizophrenia.  No change to behavior.  No behavior problems today no complaints.  Takes care of himself adequately.  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 (CVA)  Total Time spent with patient: 30 minutes  Past Psychiatric History: Past history of schizophrenia and stroke  Past Medical History:  Past Medical History:  Diagnosis Date   Myocardial infarction ([place])    Schizophrenia ([place])    Stroke ([place])     Past Surgical History:  Procedure Laterality Date   NO PAST SURGERIES     Family History:  Family History  Problem Relation Age of Onset   Hypertension Mother    Family Psychiatric  History: See previous Social History:  Social History   Substance and Sexual Activity  Alcohol Use Not Currently     Social History   Substance and Sexual Activity  Drug Use Not Currently    Social History   Socioeconomic History   Marital status: Single    Spouse name: Not on file   Number of children: Not on file   Years of education: Not on file   Highest education level: Not on file  Occupational History   Not on file  Tobacco Use   Smoking status: Never    Passive exposure: Never   Smokeless tobacco: Never  Vaping Use   Vaping Use: Unknown  Substance and Sexual  Activity   Alcohol use: Not Currently   Drug use: Not Currently   Sexual activity: Not Currently  Other Topics Concern   Not on file  Social History Narrative   Not on file   Social Determinants of Health   Financial Resource Strain: Not on file  Food Insecurity: Not on file  Transportation Needs: Not on file  Physical Activity: Not on file  Stress: Not on file  Social Connections: Not on file   Additional Social History:  Specify valuables returned:  (none)                      Sleep: Fair  Appetite:  Fair  Current Medications: Current Facility-Administered Medications  Medication Dose Route Frequency Provider Last Rate Last Admin   acetaminophen (TYLENOL) tablet 650 mg  650 mg Oral Q6H PRN Avis Mcmahill T, MD   650 mg at 02/10/23 1247   alum & mag hydroxide-simeth (MAALOX/MYLANTA) 200-200-20 MG/5ML suspension 30 mL  30 mL Oral Q4H PRN Airlie Blumenberg T, MD   30 mL at 04/26/22 [id number]   aspirin EC tablet 81 mg  81 mg Oral Daily Abrham Maslowski, [name], MD   81 mg at 02/12/23 0918   atorvastatin (LIPITOR) tablet 20 mg  20 mg Oral Daily Aqib Lough, [name], MD   20 mg at 02/12/23 0918   atropine 1 % ophthalmic solution 1 drop  1 drop Sublingual QID [name], MD  1 drop at 02/12/23 1423   benztropine (COGENTIN) tablet 0.5 mg  0.5 mg Oral BID Tanmay Halteman T, MD   0.5 mg at 02/12/23 1600   clonazePAM (KLONOPIN) tablet 1 mg  1 mg Oral TID PRN He, [name], MD   1 mg at 02/02/23 2125   cloZAPine (CLOZARIL) tablet 300 mg  300 mg Oral QHS Dinorah Masullo,  T, MD   300 mg at 02/11/23 2303   diphenhydrAMINE (BENADRYL) capsule 50 mg  50 mg Oral Q6H PRN Sricharan Lacomb,  T, MD   50 mg at 01/28/23 2105   Or   diphenhydrAMINE (BENADRYL) injection 50 mg  50 mg Intramuscular Q6H PRN Golden Emile,  T, MD   50 mg at 01/01/23 2252   feeding supplement (ENSURE ENLIVE / ENSURE PLUS) liquid 237 mL  237 mL Oral BID BM Yasin Ducat T, MD   237 mL at 02/12/23 1423   gabapentin (NEURONTIN) capsule 300 mg  300 mg  Oral TID Pattijo Juste T, MD   300 mg at 02/12/23 1549   haloperidol (HALDOL) tablet 2 mg  2 mg Oral TID Stalin Gruenberg, [name], MD   2 mg at 02/12/23 1548   haloperidol (HALDOL) tablet 5 mg  5 mg Oral Q6H PRN Hurley Sobel T, MD   5 mg at 01/23/23 1832   Or   haloperidol lactate (HALDOL) injection 5 mg  5 mg Intramuscular Q6H PRN Araiyah Cumpton T, MD   5 mg at 01/01/23 2252   lisinopril (ZESTRIL) tablet 5 mg  5 mg Oral Daily Aily Tzeng, [name], MD   5 mg at 02/12/23 0917   magnesium hydroxide (MILK OF MAGNESIA) suspension 30 mL  30 mL Oral Daily PRN Alis Sawchuk T, MD   30 mL at 01/20/23 2133   metoprolol succinate (TOPROL-XL) 24 hr tablet 25 mg  25 mg Oral Daily Rutger Salton T, MD   25 mg at 02/12/23 0918   ondansetron (ZOFRAN) tablet 4 mg  4 mg Oral Q8H PRN [name], NP   4 mg at 09/06/22 2320   paliperidone (INVEGA SUSTENNA) injection 156 mg  156 mg Intramuscular Q28 days Tierany Appleby T, MD   156 mg at 02/12/23 1615   paliperidone (INVEGA) 24 hr tablet 6 mg  6 mg Oral QHS Rayetta Veith T, MD   6 mg at 02/11/23 2303   senna-docusate (Senokot-S) tablet 2 tablet  2 tablet Oral QHS PRN [name], DO   2 tablet at 01/20/23 2133   temazepam (RESTORIL) capsule 15 mg  15 mg Oral QHS , [name], MD   15 mg at 02/11/23 2303   tuberculin injection 5 Units  5 Units Intradermal Once , [name], MD   5 Units at 02/12/23 1600   ziprasidone (GEODON) injection 20 mg  20 mg Intramuscular Q12H PRN , [name], MD   20 mg at 07/08/22 1453    Lab Results: No results found for this or any previous visit (from the past 65 hour(s)).  Blood Alcohol level:  Lab Results  Component Value Date   ETH <10 [id number]    Metabolic Disorder Labs: Lab Results  Component Value Date   HGBA1C 5.5 04/10/2022   MPG 111.15 04/10/2022   No results found for: "PROLACTIN" Lab Results  Component Value Date   CHOL 167 11/20/2021   TRIG 107 11/20/2021   HDL 26 (L) 11/20/2021   CHOLHDL 6.4  11/20/2021   VLDL 21 11/20/2021   LDLCALC 120 (H) 11/20/2021  Physical Findings: AIMS: Facial and Oral Movements Muscles of Facial Expression: None, normal Lips and Perioral Area: None, normal Jaw: None, normal Tongue: None, normal,Extremity Movements Upper (arms, wrists, hands, fingers): None, normal Lower (legs, knees, ankles, toes): None, normal, Trunk Movements Neck, shoulders, hips: None, normal, Overall Severity Severity of abnormal movements (highest score from questions above): None, normal Incapacitation due to abnormal movements: None, normal Patient's awareness of abnormal movements (rate only patient's report): No Awareness, Dental Status Current problems with teeth and/or dentures?: No Does patient usually wear dentures?: No  CIWA:    COWS:     Musculoskeletal: Strength & Muscle Tone: within normal limits Gait & Station: normal Patient leans: N/A  Psychiatric Specialty Exam:  Presentation  General Appearance:  Appropriate for Environment; Casual; Neat; Well Groomed  Eye Contact: Fair  Speech: Slow  Speech Volume: Decreased  Handedness: Left   Mood and Affect  Mood: Anxious  Affect: Congruent   Thought Process  Thought Processes: Goal Directed  Descriptions of Associations:Circumstantial  Orientation:Partial  Thought Content:Scattered  History of Schizophrenia/Schizoaffective disorder:Yes  Duration of Psychotic Symptoms:Greater than six months  Hallucinations:No data recorded Ideas of Reference:None  Suicidal Thoughts:No data recorded Homicidal Thoughts:No data recorded  Sensorium  Memory: Remote Good  Judgment: Fair  Insight: Fair   Community education officer  Concentration: Fair  Attention Span: Fair  Recall: [employer] of Knowledge: Fair  Language: Fair   Psychomotor Activity  Psychomotor Activity:No data recorded  Assets  Assets:No data recorded  Sleep  Sleep:No data recorded   Physical  Exam: Physical Exam Vitals and nursing note reviewed.  Constitutional:      Appearance: Normal appearance.  HENT:     Head: Normocephalic and atraumatic.     Mouth/Throat:     Pharynx: Oropharynx is clear.  Eyes:     Pupils: Pupils are equal, round, and reactive to light.  Cardiovascular:     Rate and Rhythm: Normal rate and regular rhythm.  Pulmonary:     Effort: Pulmonary effort is normal.     Breath sounds: Normal breath sounds.  Abdominal:     General: Abdomen is flat.     Palpations: Abdomen is soft.  Musculoskeletal:        General: Normal range of motion.  Skin:    General: Skin is warm and dry.  Neurological:     General: No focal deficit present.     Mental Status: He is alert. Mental status is at baseline.  Psychiatric:        Attention and Perception: He is inattentive.        Mood and Affect: Mood normal. Affect is blunt.        Speech: He is noncommunicative.    Review of Systems  Unable to perform ROS: Language   Blood pressure 110/76, pulse (!) 103, temperature 98.9 F (37.2 C), temperature source Oral, resp. rate 18, height '5\' 8"'$  (1.727 m), weight 85.8 kg, SpO2 99 %. Body mass index is 28.76 kg/m.   Treatment Plan Summary: Medication management and Plan no change to treatment plan except that I have ordered a PPD in anticipation of a possible group home referral within the next week  Alethia Berthold, MD 02/12/2023, 4:23 PM

## 2023-02-12 NOTE — Progress Notes (Signed)
Patient given scheduled Invega injection to the left deltoid, as well as TB skin placement to the right forearm. Patient tolerated both, without any issues. Patient remains safe on the unit.

## 2023-02-12 NOTE — BHH Counselor (Signed)
CSW received call from [place] with [facility].  Mendel Ryder reports that she has spoken with Peter Congo, the owner of the group home interested in the patient.  She reports that per Mendel Ryder the cost to stay in her homes are $3500 a month.  She reports that she will reach out to [facility] to identify if any additional funding is able to be obtained.  She reports that she will follow up with a manager and call this CSW back.    She requests that CSW follow up with patient's mother to discuss how much the mother is able to contribute to patient's lodging at this placement.  She requested that CSW follow up with patient's mother on this.    Mendel Ryder did follow up with this CSW.  Mendel Ryder reports that once CSW has obtained the amount the patient's funding can cover from the mother.  She will present that amount to the potential group home.  The group home could either accept or decline.  If declined they could present the option of special assistance.  She reports that she is not sure of the amount special assistance could cover.  She reports that the turn around on obtaining special assistance can occur within a day or two.  She recommends that patient's mother not be made aware of the special assistance at this time. She reports that the special assistance would be continuous.  CSW has staffed the above information with [facility] Supervisor Oretha Caprice.  Per Supervisor she agrees with the above. She requests that CSW follow up with group home on if mother can cut check or if they will have to become the patient's payee.  She also requests that CSW inquire about when patient can arrive.  After CSW completes the above, [facility] Supervisor and this CSW will reach out to patients mother together.  Assunta Curtis, MSW, LCSW 02/12/2023 12:26 PM

## 2023-02-13 NOTE — Group Note (Signed)
Recreation Therapy Group Note   Group Topic:Relaxation  Group Date: 02/13/2023 Start Time: 1000 End Time: 1045 Facilitators: [name], LRT, CTRS Location:  Craft Room  Group Description: Mindfulness Body Scan. LRT asked pt their current level of stress and anxiety. LRT educated on the benefits of mindfulness and how it can apply to everyday life post-discharge. LRT and pt's followed along to an audio script of a "mindfulness body scan" video. LRT asked pt their level of stress and anxiety once the prompt was finished.   Affect/Mood: Appropriate   Participation Level: Non-verbal   Participation Quality: Independent   Behavior: Distracted and Disinterested   Speech/Thought Process: Unfocused   Insight: Limited   Judgement: Limited   Modes of Intervention: Activity and Education   Patient Response to Interventions:  Disengaged   Education Outcome:  In group clarification offered    Clinical Observations/Individualized Feedback: Adam [name] was minimally active in their participation of session activities and group discussion. Pt was in and out of group more than once. Pt did not complete the sheet rating his anxiety or stress levels.    Plan: Continue to engage patient in RT group sessions 2-3x/week.   [name], LRT, CTRS 02/13/2023 11:15 AM

## 2023-02-13 NOTE — Plan of Care (Signed)
  Problem: Coping: Goal: Ability to identify and develop effective coping behavior will improve Outcome: Progressing   Problem: Self-Concept: Goal: Level of anxiety will decrease Outcome: Progressing   Problem: Activity: Goal: Risk for activity intolerance will decrease Outcome: Progressing   Problem: Nutrition: Goal: Adequate nutrition will be maintained Outcome: Progressing   Problem: Coping: Goal: Level of anxiety will decrease Outcome: Progressing

## 2023-02-13 NOTE — Progress Notes (Signed)
[facility] MD Progress Note  02/13/2023 1:40 PM Adam [name]  MRN:  [id number] Subjective: Follow-up 62 year old man with schizophrenia.  No change in any of his behavior or presentation.  Today I was handed a note from someone saying that they wanted a "formal evaluation" of his capacity to make decisions about placement evidently this is in regard to guardianship.  Meanwhile however it appears we may be on track to find an appropriate place for him to stay by early next week. 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)  Total Time spent with patient: 30 minutes  Past Psychiatric History: Past history of schizophrenia and stroke with aphasia  Past Medical History:  Past Medical History:  Diagnosis Date   Myocardial infarction ([place])    Schizophrenia ([place])    Stroke ([place])     Past Surgical History:  Procedure Laterality Date   NO PAST SURGERIES     Family History:  Family History  Problem Relation Age of Onset   Hypertension Mother    Family Psychiatric  History: See previous Social History:  Social History   Substance and Sexual Activity  Alcohol Use Not Currently     Social History   Substance and Sexual Activity  Drug Use Not Currently    Social History   Socioeconomic History   Marital status: Single    Spouse name: Not on file   Number of children: Not on file   Years of education: Not on file   Highest education level: Not on file  Occupational History   Not on file  Tobacco Use   Smoking status: Never    Passive exposure: Never    Smokeless tobacco: Never  Vaping Use   Vaping Use: Unknown  Substance and Sexual Activity   Alcohol use: Not Currently   Drug use: Not Currently   Sexual activity: Not Currently  Other Topics Concern   Not on file  Social History Narrative   Not on file   Social Determinants of Health   Financial Resource Strain: Not on file  Food Insecurity: Not on file  Transportation Needs: Not on file  Physical Activity: Not on file  Stress: Not on file  Social Connections: Not on file   Additional Social History:  Specify valuables returned:  (none)                      Sleep: Fair  Appetite:  Fair  Current Medications: Current Facility-Administered Medications  Medication Dose Route Frequency Provider Last Rate Last Admin   acetaminophen (TYLENOL) tablet 650 mg  650 mg Oral Q6H PRN Alinah Sheard T, MD   650 mg at 02/10/23 1247   alum & mag hydroxide-simeth (MAALOX/MYLANTA) 200-200-20 MG/5ML suspension 30 mL  30 mL Oral Q4H PRN Sostenes Kauffmann T, MD   30 mL at 04/26/22 0721   aspirin EC tablet 81 mg  81 mg Oral Daily Yamilet Mcfayden, [name], MD   81 mg at 02/13/23 0907   atorvastatin (LIPITOR) tablet 20 mg  20 mg Oral Daily Madalena Kesecker, [name], MD   20 mg at 02/13/23 [phone]  atropine 1 % ophthalmic solution 1 drop  1 drop Sublingual QID [name], MD   1 drop at 02/13/23 1247   benztropine (COGENTIN) tablet 0.5 mg  0.5 mg Oral BID Sarely Stracener T, MD   0.5 mg at 02/13/23 [id number]   clonazePAM (KLONOPIN) tablet 1 mg  1 mg Oral TID PRN He, [name], MD   1 mg at 02/02/23 2125   cloZAPine (CLOZARIL) tablet 300 mg  300 mg Oral QHS Iyesha Such,  T, MD   300 mg at 02/12/23 2100   diphenhydrAMINE (BENADRYL) capsule 50 mg  50 mg Oral Q6H PRN Gadiel ,  T, MD   50 mg at 01/28/23 2105   Or   diphenhydrAMINE (BENADRYL) injection 50 mg  50 mg Intramuscular Q6H PRN Jeroline Wolbert T, MD   50 mg at 01/01/23 2252   feeding supplement (ENSURE ENLIVE / ENSURE PLUS) liquid 237 mL  237 mL Oral BID BM Avalynn Bowe  T, MD   237 mL at 02/13/23 1035   gabapentin (NEURONTIN) capsule 300 mg  300 mg Oral TID Tahliyah Anagnos T, MD   300 mg at 02/13/23 [id number]   haloperidol (HALDOL) tablet 2 mg  2 mg Oral TID Monee Dembeck, [name], MD   2 mg at 02/13/23 [id number]   haloperidol (HALDOL) tablet 5 mg  5 mg Oral Q6H PRN [place] Want T, MD   5 mg at 01/23/23 [id number]   Or   haloperidol lactate (HALDOL) injection 5 mg  5 mg Intramuscular Q6H PRN Magalie Almon T, MD   5 mg at 01/01/23 2252   lisinopril (ZESTRIL) tablet 5 mg  5 mg Oral Daily Ginnette Gates, [name], MD   5 mg at 02/12/23 0917   magnesium hydroxide (MILK OF MAGNESIA) suspension 30 mL  30 mL Oral Daily PRN Jamyson Jirak T, MD   30 mL at 01/20/23 2133   metoprolol succinate (TOPROL-XL) 24 hr tablet 25 mg  25 mg Oral Daily Manie Bealer T, MD   25 mg at 02/12/23 0918   ondansetron (ZOFRAN) tablet 4 mg  4 mg Oral Q8H PRN [name], NP   4 mg at 09/06/22 2320   paliperidone (INVEGA SUSTENNA) injection 156 mg  156 mg Intramuscular Q28 days Laquia Rosano T, MD   156 mg at 02/12/23 1615   paliperidone (INVEGA) 24 hr tablet 6 mg  6 mg Oral QHS Ladavion Savitz T, MD   6 mg at 02/12/23 2100   senna-docusate (Senokot-S) tablet 2 tablet  2 tablet Oral QHS PRN [name], DO   2 tablet at 01/20/23 2133   temazepam (RESTORIL) capsule 15 mg  15 mg Oral QHS Catia Todorov T, MD   15 mg at 02/12/23 2100   tuberculin injection 5 Units  5 Units Intradermal Once , [name], MD   5 Units at 02/12/23 1600   ziprasidone (GEODON) injection 20 mg  20 mg Intramuscular Q12H PRN , [name], MD   20 mg at 07/08/22 1453    Lab Results: No results found for this or any previous visit (from the past 26 hour(s)).  Blood Alcohol level:  Lab Results  Component Value Date   ETH <10 [id number]    Metabolic Disorder Labs: Lab Results  Component Value Date   HGBA1C 5.5 04/10/2022   MPG 111.15 04/10/2022   No results found for: "PROLACTIN" Lab Results  Component Value Date   CHOL  167 11/20/2021   TRIG 107 11/20/2021   HDL 26 (L) 11/20/2021  CHOLHDL 6.4 11/20/2021   VLDL 21 11/20/2021   LDLCALC 120 (H) 11/20/2021    Physical Findings: AIMS: Facial and Oral Movements Muscles of Facial Expression: None, normal Lips and Perioral Area: None, normal Jaw: None, normal Tongue: None, normal,Extremity Movements Upper (arms, wrists, hands, fingers): None, normal Lower (legs, knees, ankles, toes): None, normal, Trunk Movements Neck, shoulders, hips: None, normal, Overall Severity Severity of abnormal movements (highest score from questions above): None, normal Incapacitation due to abnormal movements: None, normal Patient's awareness of abnormal movements (rate only patient's report): No Awareness, Dental Status Current problems with teeth and/or dentures?: No Does patient usually wear dentures?: No  CIWA:    COWS:     Musculoskeletal: Strength & Muscle Tone: within normal limits Gait & Station: normal Patient leans: N/A  Psychiatric Specialty Exam:  Presentation  General Appearance:  Appropriate for Environment; Casual; Neat; Well Groomed  Eye Contact: Fair  Speech: Slow  Speech Volume: Decreased  Handedness: Left   Mood and Affect  Mood: Anxious  Affect: Congruent   Thought Process  Thought Processes: Goal Directed  Descriptions of Associations:Circumstantial  Orientation:Partial  Thought Content:Scattered  History of Schizophrenia/Schizoaffective disorder:Yes  Duration of Psychotic Symptoms:Greater than six months  Hallucinations:No data recorded Ideas of Reference:None  Suicidal Thoughts:No data recorded Homicidal Thoughts:No data recorded  Sensorium  Memory: Remote Good  Judgment: Fair  Insight: Fair   Community education officer  Concentration: Fair  Attention Span: Fair  Recall: [place] of Knowledge: Fair  Language: Fair   Psychomotor Activity  Psychomotor Activity:No data recorded  Assets   Assets:No data recorded  Sleep  Sleep:No data recorded   Physical Exam: Physical Exam Vitals reviewed.  Constitutional:      Appearance: Normal appearance.  HENT:     Head: Normocephalic and atraumatic.     Mouth/Throat:     Pharynx: Oropharynx is clear.  Eyes:     Pupils: Pupils are equal, round, and reactive to light.  Cardiovascular:     Rate and Rhythm: Normal rate and regular rhythm.  Pulmonary:     Effort: Pulmonary effort is normal.     Breath sounds: Normal breath sounds.  Abdominal:     General: Abdomen is flat.     Palpations: Abdomen is soft.  Musculoskeletal:        General: Normal range of motion.  Skin:    General: Skin is warm and dry.  Neurological:     General: No focal deficit present.     Mental Status: He is alert. Mental status is at baseline.  Psychiatric:        Attention and Perception: He is inattentive.        Mood and Affect: Mood normal. Affect is inappropriate.        Speech: He is noncommunicative.    Review of Systems  Unable to perform ROS: Language   Blood pressure 124/82, pulse (!) 103, temperature 98.9 F (37.2 C), temperature source Oral, resp. rate 18, height '5\' 8"'$  (1.727 m), weight 85.8 kg, SpO2 99 %. Body mass index is 28.76 kg/m.   Treatment Plan Summary: Plan no change to medication management.  Reviewed with treatment team that we want to make sure we have medical follow-up all arranged prior to any discharge especially if he is leaving the county.  As far as the "formal evaluation" goes, I can say that Mr. Cerbone definitely does not have the capacity to make decisions regarding his living situation.  He is unable to communicate using  language and any but the most very rudimentary sense and even that is inconsistent.  It is not possible to have a conversation with him or to feel certain of any of his responses.  Because of this he cannot engage in any kind of conversation about living situation and therefore lacks capacity.   This is because of his diagnoses both of schizophrenia and of cognitive deficit related to stroke and aphasia related to stroke neither of which will be getting any better.  Alethia Berthold, MD 02/13/2023, 1:40 PM

## 2023-02-13 NOTE — Group Note (Signed)
LCSW Group Therapy Note   Group Date: 02/13/2023 Start Time: 1300 End Time: 1400   Type of Therapy and Topic:  Group Therapy: Boundaries  Participation Level:  None  Description of Group: This group will address the use of boundaries in their personal lives. Patients will explore why boundaries are important, the difference between healthy and unhealthy boundaries, and negative and postive outcomes of different boundaries and will look at how boundaries can be crossed.  Patients will be encouraged to identify current boundaries in their own lives and identify what kind of boundary is being set. Facilitators will guide patients in utilizing problem-solving interventions to address and correct types boundaries being used and to address when no boundary is being used. Understanding and applying boundaries will be explored and addressed for obtaining and maintaining a balanced life. Patients will be encouraged to explore ways to assertively make their boundaries and needs known to significant others in their lives, using other group members and facilitator for role play, support, and feedback.  Therapeutic Goals:  1.  Patient will identify areas in their life where setting clear boundaries could be  used to improve their life.  2.  Patient will identify signs/triggers that a boundary is not being respected. 3.  Patient will identify two ways to set boundaries in order to achieve balance in  their lives: 4.  Patient will demonstrate ability to communicate their needs and set boundaries  through discussion and/or role plays  Summary of Patient Progress:   Patient was present in group.  Patient did not engage in group discussions.  Patient appeared to be upset as evidenced by appearance and body language.   Therapeutic Modalities:   Cognitive Behavioral Therapy Solution-Focused Therapy  Rozann Lesches, LCSWA 02/13/2023  2:04 PM

## 2023-02-13 NOTE — BHH Counselor (Signed)
CSW attended weekly meeting to address patient's needs.  Meeting attendees included: [facility]: Sherre Scarlet, Rhett Bannister, Dr. Courtney Heys., Velva Harman [facility]: CSW Team Alvester Chou "Robbie" F, TOC Supervisor Guinevere Ferrari, [place] Director Dolores Hoose, [place] Nurse Managers Velva Harman and M "Gigi" [place] team arrived to meeting late.  Once in attendance CSW team observed the conversation to be surrounding the patients capacity or lack thereof.  Dr. Raliegh Ip was curious of patient should be adjudicated incompetent.  Dr. Raliegh Ip mentioned concerns over notes written inpatient's chart that indicate that patient has capacity and should be allowed to make his own decisions.  Dr. Raliegh Ip questioned if patient has capacity and if it should be documented that he does not and if guardianship should be pursued.   CSW updated that at this time a APS report on guardianship has been made.    CSW was asked to follow up with Dr. Weber Cooks, along with Junita Push, on the need for documentation on the patients capacity ability.    Assunta Curtis, MSW, LCSW 02/13/2023 4:12 PM

## 2023-02-13 NOTE — Progress Notes (Signed)
Patient noted in dayroom watching Hockey, appropriate with staff and peers. Voiced no complaints or concerns. Denies SI, Hi, AVH. Medication compliant. Small nose bleed this shift. Pressure held to stop bleeding. No other complaints. Encouragement and support provided. Safety checks maintained. Medications given as prescribed. Pt receptive and remains safe on unit with q 15 min checks.

## 2023-02-13 NOTE — Plan of Care (Signed)
D: Patient alert and oriented. Patient denies pain. Patient denies anxiety and depression. Patient denies SI/HI/AVH. Patient has been observed around the unit interacting at baseline with staff and peers.  A: Scheduled medications administered to patient, per MD orders.  Support and encouragement provided to patient.  Q15 minute safety checks maintained.   R: Patient compliant with medication administration and treatment plan. No adverse drug reactions noted. Patient remains safe on the unit at this time. Problem: Education: Goal: Knowledge of General Education information will improve Description: Including pain rating scale, medication(s)/side effects and non-pharmacologic comfort measures Outcome: Progressing   Problem: Clinical Measurements: Goal: Ability to maintain clinical measurements within normal limits will improve Outcome: Progressing   Problem: Nutrition: Goal: Adequate nutrition will be maintained Outcome: Progressing

## 2023-02-14 LAB — CBC WITH DIFFERENTIAL/PLATELET
Abs Immature Granulocytes: 0.01 10*3/uL (ref 0.00–0.07)
Basophils Absolute: 0.2 10*3/uL — ABNORMAL HIGH (ref 0.0–0.1)
Basophils Relative: 3 %
Eosinophils Absolute: 0 10*3/uL (ref 0.0–0.5)
Eosinophils Relative: 1 %
HCT: 40 % (ref 39.0–52.0)
Hemoglobin: 13.7 g/dL (ref 13.0–17.0)
Immature Granulocytes: 0 %
Lymphocytes Relative: 19 %
Lymphs Abs: 1 10*3/uL (ref 0.7–4.0)
MCH: 30.4 pg (ref 26.0–34.0)
MCHC: 34.3 g/dL (ref 30.0–36.0)
MCV: 88.9 fL (ref 80.0–100.0)
Monocytes Absolute: 0.5 10*3/uL (ref 0.1–1.0)
Monocytes Relative: 9 %
Neutro Abs: 3.5 10*3/uL (ref 1.7–7.7)
Neutrophils Relative %: 68 %
Platelets: 162 10*3/uL (ref 150–400)
RBC: 4.5 MIL/uL (ref 4.22–5.81)
RDW: 13.3 % (ref 11.5–15.5)
WBC: 5.2 10*3/uL (ref 4.0–10.5)
nRBC: 0 % (ref 0.0–0.2)

## 2023-02-14 NOTE — Progress Notes (Signed)
[facility] MD Progress Note  02/14/2023 12:37 PM Adam [name]  MRN:  [id number] Subjective: Follow-up patient with schizophrenia.  No change in clinical presentation.  Good behavior today.  Calm no agitation.  Patient's mother came to visit for a scheduled planned visit today.  He appeared to be enjoying himself and was pleasant and upbeat with no agitation. 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)  Total Time spent with patient: 30 minutes  Past Psychiatric History: Past history of schizophrenia  Past Medical History:  Past Medical History:  Diagnosis Date   Myocardial infarction ([place])    Schizophrenia ([place])    Stroke ([place])     Past Surgical History:  Procedure Laterality Date   NO PAST SURGERIES     Family History:  Family History  Problem Relation Age of Onset   Hypertension Mother    Family Psychiatric  History: See previous Social History:  Social History   Substance and Sexual Activity  Alcohol Use Not Currently     Social History   Substance and Sexual Activity  Drug Use Not Currently    Social History   Socioeconomic History   Marital status: Single    Spouse name: Not on file   Number of children: Not on file   Years of education: Not on file   Highest education level: Not on file  Occupational History   Not on file  Tobacco Use   Smoking status: Never    Passive exposure: Never   Smokeless tobacco: Never  Vaping Use   Vaping Use: Unknown  Substance and Sexual Activity   Alcohol use: Not Currently   Drug use: Not  Currently   Sexual activity: Not Currently  Other Topics Concern   Not on file  Social History Narrative   Not on file   Social Determinants of Health   Financial Resource Strain: Not on file  Food Insecurity: Not on file  Transportation Needs: Not on file  Physical Activity: Not on file  Stress: Not on file  Social Connections: Not on file   Additional Social History:  Specify valuables returned:  (none)                      Sleep: Fair  Appetite:  Fair  Current Medications: Current Facility-Administered Medications  Medication Dose Route Frequency Provider Last Rate Last Admin   acetaminophen (TYLENOL) tablet 650 mg  650 mg Oral Q6H PRN Evonna Stoltz T, MD   650 mg at 02/10/23 1247   alum & mag hydroxide-simeth (MAALOX/MYLANTA) 200-200-20 MG/5ML suspension 30 mL  30 mL Oral Q4H PRN Eleni Frank T, MD   30 mL at 04/26/22 [id number]   aspirin EC tablet 81 mg  81 mg Oral Daily Delmo Matty T, MD   81 mg at 02/14/23 1229   atorvastatin (LIPITOR) tablet 20 mg  20 mg Oral Daily Arlee Santosuosso T, MD   20 mg at 02/14/23 1229   atropine 1 % ophthalmic solution 1 drop  1 drop Sublingual QID [name], MD   1 drop at 02/14/23 1232   benztropine (COGENTIN) tablet  0.5 mg  0.5 mg Oral BID Franchesca Veneziano T, MD   0.5 mg at 02/14/23 1229   clonazePAM (KLONOPIN) tablet 1 mg  1 mg Oral TID PRN He, [name], MD   1 mg at 02/02/23 2125   cloZAPine (CLOZARIL) tablet 300 mg  300 mg Oral QHS Hong Moring,  T, MD   300 mg at 02/13/23 2116   diphenhydrAMINE (BENADRYL) capsule 50 mg  50 mg Oral Q6H PRN Burgundy Matuszak,  T, MD   50 mg at 01/28/23 2105   Or   diphenhydrAMINE (BENADRYL) injection 50 mg  50 mg Intramuscular Q6H PRN Anjela Cassara T, MD   50 mg at 01/01/23 2252   feeding supplement (ENSURE ENLIVE / ENSURE PLUS) liquid 237 mL  237 mL Oral BID BM Val Farnam T, MD   237 mL at 02/14/23 1231   gabapentin (NEURONTIN) capsule 300 mg  300 mg Oral TID Nakyia Dau T, MD   300 mg at 02/14/23 1229    haloperidol (HALDOL) tablet 2 mg  2 mg Oral TID Erique Kaser T, MD   2 mg at 02/14/23 1229   haloperidol (HALDOL) tablet 5 mg  5 mg Oral Q6H PRN Cliff Damiani T, MD   5 mg at 01/23/23 [id number]   Or   haloperidol lactate (HALDOL) injection 5 mg  5 mg Intramuscular Q6H PRN Pegah Segel T, MD   5 mg at 01/01/23 2252   lisinopril (ZESTRIL) tablet 5 mg  5 mg Oral Daily Noni Stonesifer T, MD   5 mg at 02/14/23 1229   magnesium hydroxide (MILK OF MAGNESIA) suspension 30 mL  30 mL Oral Daily PRN Averiana Clouatre T, MD   30 mL at 01/20/23 2133   metoprolol succinate (TOPROL-XL) 24 hr tablet 25 mg  25 mg Oral Daily Lynnelle Mesmer T, MD   25 mg at 02/14/23 1229   ondansetron (ZOFRAN) tablet 4 mg  4 mg Oral Q8H PRN [name], NP   4 mg at 09/06/22 2320   paliperidone (INVEGA SUSTENNA) injection 156 mg  156 mg Intramuscular Q28 days Jerrard Bradburn T, MD   156 mg at 02/12/23 1615   paliperidone (INVEGA) 24 hr tablet 6 mg  6 mg Oral QHS Jaysten Essner T, MD   6 mg at 02/13/23 2116   senna-docusate (Senokot-S) tablet 2 tablet  2 tablet Oral QHS PRN [name], DO   2 tablet at 01/20/23 2133   temazepam (RESTORIL) capsule 15 mg  15 mg Oral QHS Rana Hochstein T, MD   15 mg at 02/13/23 2116   tuberculin injection 5 Units  5 Units Intradermal Once , [name], MD   5 Units at 02/12/23 1600   ziprasidone (GEODON) injection 20 mg  20 mg Intramuscular Q12H PRN , [name], MD   20 mg at 07/08/22 1453    Lab Results: No results found for this or any previous visit (from the past 7 hour(s)).  Blood Alcohol level:  Lab Results  Component Value Date   ETH <10 [id number]    Metabolic Disorder Labs: Lab Results  Component Value Date   HGBA1C 5.5 04/10/2022   MPG 111.15 04/10/2022   No results found for: "PROLACTIN" Lab Results  Component Value Date   CHOL 167 11/20/2021   TRIG 107 11/20/2021   HDL 26 (L) 11/20/2021   CHOLHDL 6.4 11/20/2021   VLDL 21 11/20/2021   LDLCALC 120 (H) 11/20/2021     Physical Findings: AIMS: Facial and Oral Movements Muscles  of Facial Expression: None, normal Lips and Perioral Area: None, normal Jaw: None, normal Tongue: None, normal,Extremity Movements Upper (arms, wrists, hands, fingers): None, normal Lower (legs, knees, ankles, toes): None, normal, Trunk Movements Neck, shoulders, hips: None, normal, Overall Severity Severity of abnormal movements (highest score from questions above): None, normal Incapacitation due to abnormal movements: None, normal Patient's awareness of abnormal movements (rate only patient's report): No Awareness, Dental Status Current problems with teeth and/or dentures?: No Does patient usually wear dentures?: No  CIWA:    COWS:     Musculoskeletal: Strength & Muscle Tone: within normal limits Gait & Station: normal Patient leans: N/A  Psychiatric Specialty Exam:  Presentation  General Appearance:  Appropriate for Environment; Casual; Neat; Well Groomed  Eye Contact: Fair  Speech: Slow  Speech Volume: Decreased  Handedness: Left   Mood and Affect  Mood: Anxious  Affect: Congruent   Thought Process  Thought Processes: Goal Directed  Descriptions of Associations:Circumstantial  Orientation:Partial  Thought Content:Scattered  History of Schizophrenia/Schizoaffective disorder:Yes  Duration of Psychotic Symptoms:Greater than six months  Hallucinations:No data recorded Ideas of Reference:None  Suicidal Thoughts:No data recorded Homicidal Thoughts:No data recorded  Sensorium  Memory: Remote Good  Judgment: Fair  Insight: Fair   Community education officer  Concentration: Fair  Attention Span: Fair  Recall: [employer] of Knowledge: Fair  Language: Fair   Psychomotor Activity  Psychomotor Activity:No data recorded  Assets  Assets:No data recorded  Sleep  Sleep:No data recorded   Physical Exam: Physical Exam Vitals and nursing note reviewed.   Constitutional:      Appearance: Normal appearance.  HENT:     Head: Normocephalic and atraumatic.     Mouth/Throat:     Pharynx: Oropharynx is clear.  Eyes:     Pupils: Pupils are equal, round, and reactive to light.  Cardiovascular:     Rate and Rhythm: Normal rate and regular rhythm.  Pulmonary:     Effort: Pulmonary effort is normal.     Breath sounds: Normal breath sounds.  Abdominal:     General: Abdomen is flat.     Palpations: Abdomen is soft.  Musculoskeletal:        General: Normal range of motion.  Skin:    General: Skin is warm and dry.  Neurological:     General: No focal deficit present.     Mental Status: He is alert. Mental status is at baseline.  Psychiatric:        Attention and Perception: He is inattentive.        Mood and Affect: Mood normal. Affect is blunt.        Speech: He is noncommunicative.    Review of Systems  Unable to perform ROS: Language   Blood pressure 124/84, pulse (!) 118, temperature 98.9 F (37.2 C), temperature source Oral, resp. rate 18, height '5\' 8"'$  (1.727 m), weight 85.8 kg, SpO2 99 %. Body mass index is 28.76 kg/m.   Treatment Plan Summary: Plan no change to medication.  We are still on track for a possible discharge by next week.  PPD was read today having been placed 48 hours ago and is officially read as 0 so negative for any signs of tuberculosis.  Alethia Berthold, MD 02/14/2023, 12:37 PM

## 2023-02-14 NOTE — Progress Notes (Signed)
Pharmacy Consult - Clozapine     62 yo male ordered clozapine 300 mg PO daily  This patient's order has been reviewed for prescribing contraindications.    Clozapine REMS enrollment Verified: yes on 1/7 REMS patient ID: [id number] Current Outpatient Monitoring: Weekly    Home Regimen:  300 mg PO QHS    Dose Adjustments This Admission: N/A   Labs: Date    [place]    Submitted? 1/7 2600 Yes 1/14 3900 Yes 1/21 3600 Yes 1/29 3400 Yes   2/6 3500 Yes   2/13 3400 Yes 2/20 3100 Yes 2/27 2900 Yes 3/5 2800 Yes 3/14 3536 Yes  Plan: Continue clozapine 300 mg QHS Monitor ANC every 2 weeks (clozapine stated > 67moago) - discussed with Dr CWeber CooksNext AJohnson3/28/2024   Anchor Dwan Rodriguez-Guzman PharmD, BCPS 02/14/2023 2:15 PM

## 2023-02-14 NOTE — Progress Notes (Signed)
   02/13/23 2300  Psych Admission Type (Psych Patients Only)  Admission Status Involuntary  Psychosocial Assessment  Patient Complaints None  Eye Contact Fair  Facial Expression Animated  Affect Preoccupied  Speech Aphasic  Interaction Childlike  Motor Activity Restless;Pacing  Appearance/Hygiene Unremarkable  Behavior Characteristics Fidgety  Mood Preoccupied  Thought Process  Coherency WDL  Content WDL  Delusions None reported or observed  Perception WDL  Hallucination None reported or observed  Judgment Limited  Confusion WDL  Danger to Self  Current suicidal ideation? Denies  Self-Injurious Behavior No self-injurious ideation or behavior indicators observed or expressed   Danger to Others  Danger to Others None reported or observed  Danger to Others Abnormal  Harmful Behavior to others No threats or harm toward other people

## 2023-02-14 NOTE — Plan of Care (Signed)
D: Patient alert and oriented. Patient denies pain. Patient denies anxiety and depression. Patient denies SI/HI/AVH. Patient has been interacting with staff and peers on the unit throughout shift.  A: Scheduled medications administered to patient, per MD orders.  Support and encouragement provided to patient.  Q15 minute safety checks maintained.   R: Patient compliant with medication administration and treatment plan. No adverse drug reactions noted. Patient remains safe on the unit at this time. Problem: Education: Goal: Knowledge of General Education information will improve Description: Including pain rating scale, medication(s)/side effects and non-pharmacologic comfort measures Outcome: Progressing   Problem: Health Behavior/Discharge Planning: Goal: Ability to manage health-related needs will improve Outcome: Progressing   Problem: Clinical Measurements: Goal: Ability to maintain clinical measurements within normal limits will improve Outcome: Progressing

## 2023-02-14 NOTE — Group Note (Signed)
Recreation Therapy Group Note   Group Topic:Coping Skills  Group Date: 02/14/2023 Start Time: 1000 End Time: 1045 Facilitators: [name], LRT, CTRS Location:  Craft Room  Group Description: Mind Map.  Patient was provided a blank template of a diagram with 32 blank boxes in a tiered system, branching from the center (similar to a bubble chart). LRT directed patients to label the middle of the diagram "Coping Skills". LRT and patients then came up with 8 different coping skills as examples. Pt were directed to record their coping skills in the 2nd tier boxes closest to the center.  Patients would then share their coping skills with the group as LRT wrote them out. LRT gave a handout of 100 different coping skills at the end of group.   Affect/Mood: N/A   Participation Level: Did not attend    Clinical Observations/Individualized Feedback: Adam [name] came into the craft room before group got started and as soon as he received the template, he got up and left the room. Pt did not return.   Plan: Continue to engage patient in RT group sessions 2-3x/week.   [name], LRT, CTRS 02/14/2023 11:00 AM

## 2023-02-14 NOTE — Progress Notes (Signed)
   02/14/23 1835  PPD Results  Does patient have an induration at the injection site? No  Induration(mm) 0 mm  Name of Physician Notified [name], MD

## 2023-02-14 NOTE — BHH Counselor (Signed)
CSW assisted patient in completing a face to face vist with is mother.  Special arrangements were made due to mother being elderly and needing others to transport her to visit patient.  Mother also lives in [place].  Patient appeared to be happy when he noticed mother.  Patient embraced mother and shouted loudly for several moments.  Tone did not suggest anger but happiness.  Mother and patient embraced for several minutes during which patient became emotional and tearful.  CSW notes that is has been over a year since mother has seen patient.  CSW provided observation throughout meeting with additional support from MHT staff, nursing and security.   Mother brought clothing which was catalogued and placed in patient's belongings.  Some items (two pair of shorts) were given to mother for mother to return as they were too big.    CSW reviewed with mother pts upcoming placement.    CSW called Peter Congo at [facility].  She reports that the patient does not need to arrive with check in hand.  However, Peter Congo will send an email with bill pay for the prorated amount for March and it will be set up for automatic payments every month following.  Peter Congo confirmed that the amount is $3500 a month.  CSW obtained the address for the home.  Patient is expected to transition on Tuesday 02/19/2023.  CSW to follow up on Monday 02/18/2023 to confirm if they can provide transportation for the patient.  Peter Congo reports that it is possible that they will NOT be able to provide transportation.   CSW updated mother on information provided above.   Address to group home:  [address] [place] [address]  Adam [name], MSW, LCSW 02/14/2023 1:36 PM

## 2023-02-14 NOTE — BHH Group Notes (Signed)
[facility] Group Notes:  (Nursing/MHT/Case Management/Adjunct)  Date:  02/14/2023  Time:  9:31 AM  Type of Therapy:   community meeting  Participation Level:  None  Participation Quality:   none  Affect:  Appropriate  Cognitive:  Appropriate  Insight:  None  Engagement in Group:  None  Modes of Intervention:  Discussion and Education  Summary of Progress/Problems:  Antonieta Pert 02/14/2023, 9:31 AM

## 2023-02-15 NOTE — Progress Notes (Signed)
[facility] MD Progress Note  02/15/2023 1:36 PM Adam [name]  MRN:  [id number] Subjective: Follow-up patient with schizophrenia.  No change to behavior.  Takes care of his ADLs.  No aggression no threats no violence.  Very nice meeting in person yesterday with his mother who appears to be willing to agree to the plan that we are developing for discharge next week. 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)  Total Time spent with patient: 20 minutes  Past Psychiatric History: Past history of schizophrenia  Past Medical History:  Past Medical History:  Diagnosis Date   Myocardial infarction ([place])    Schizophrenia ([place])    Stroke ([place])     Past Surgical History:  Procedure Laterality Date   NO PAST SURGERIES     Family History:  Family History  Problem Relation Age of Onset   Hypertension Mother    Family Psychiatric  History: See previous Social History:  Social History   Substance and Sexual Activity  Alcohol Use Not Currently     Social History   Substance and Sexual Activity  Drug Use Not Currently    Social History   Socioeconomic History   Marital status: Single    Spouse name: Not on file   Number of children: Not on file   Years of education: Not on file   Highest education level: Not on file  Occupational History   Not on file  Tobacco Use   Smoking status: Never    Passive exposure: Never   Smokeless tobacco: Never  Vaping Use   Vaping Use: Unknown  Substance and Sexual Activity   Alcohol use: Not Currently   Drug use: Not  Currently   Sexual activity: Not Currently  Other Topics Concern   Not on file  Social History Narrative   Not on file   Social Determinants of Health   Financial Resource Strain: Not on file  Food Insecurity: Not on file  Transportation Needs: Not on file  Physical Activity: Not on file  Stress: Not on file  Social Connections: Not on file   Additional Social History:  Specify valuables returned:  (none)                      Sleep: Fair  Appetite:  Fair  Current Medications: Current Facility-Administered Medications  Medication Dose Route Frequency Provider Last Rate Last Admin   acetaminophen (TYLENOL) tablet 650 mg  650 mg Oral Q6H PRN Vasili Fok T, MD   650 mg at 02/10/23 1247   alum & mag hydroxide-simeth (MAALOX/MYLANTA) 200-200-20 MG/5ML suspension 30 mL  30 mL Oral Q4H PRN Jester Klingberg T, MD   30 mL at 04/26/22 [id number]   aspirin EC tablet 81 mg  81 mg Oral Daily Myasia Sinatra, [name], MD   81 mg at 02/15/23 0813   atorvastatin (LIPITOR) tablet 20 mg  20 mg Oral Daily Finnlee Guarnieri T, MD   20 mg at 02/15/23 0809   atropine 1 % ophthalmic solution 1 drop  1 drop Sublingual QID [name], MD   1 drop at 02/15/23 1147  benztropine (COGENTIN) tablet 0.5 mg  0.5 mg Oral BID Juniper Snyders, [name], MD   0.5 mg at 02/15/23 0810   clonazePAM (KLONOPIN) tablet 1 mg  1 mg Oral TID PRN He, [name], MD   1 mg at 02/02/23 2125   cloZAPine (CLOZARIL) tablet 300 mg  300 mg Oral QHS Anothony Bursch, [name], MD   300 mg at 02/14/23 2059   diphenhydrAMINE (BENADRYL) capsule 50 mg  50 mg Oral Q6H PRN Kamyiah Colantonio, [name], MD   50 mg at 01/28/23 2105   Or   diphenhydrAMINE (BENADRYL) injection 50 mg  50 mg Intramuscular Q6H PRN Rochester Serpe, [name], MD   50 mg at 01/01/23 2252   feeding supplement (ENSURE ENLIVE / ENSURE PLUS) liquid 237 mL  237 mL Oral BID BM Maison Kestenbaum T, MD   237 mL at 02/15/23 1010   gabapentin (NEURONTIN) capsule 300 mg  300 mg Oral TID Kyan Yurkovich T, MD   300 mg at 02/15/23 1010    haloperidol (HALDOL) tablet 2 mg  2 mg Oral TID Brownie Gockel, [name], MD   2 mg at 02/15/23 1010   haloperidol (HALDOL) tablet 5 mg  5 mg Oral Q6H PRN Nicollette Wilhelmi, [name], MD   5 mg at 01/23/23 [id number]   Or   haloperidol lactate (HALDOL) injection 5 mg  5 mg Intramuscular Q6H PRN Marlo Arriola, [name], MD   5 mg at 01/01/23 2252   lisinopril (ZESTRIL) tablet 5 mg  5 mg Oral Daily Shaton Lore, [name], MD   5 mg at 02/15/23 0810   magnesium hydroxide (MILK OF MAGNESIA) suspension 30 mL  30 mL Oral Daily PRN Billijo Dilling T, MD   30 mL at 01/20/23 2133   metoprolol succinate (TOPROL-XL) 24 hr tablet 25 mg  25 mg Oral Daily Brahim Dolman T, MD   25 mg at 02/15/23 0809   ondansetron (ZOFRAN) tablet 4 mg  4 mg Oral Q8H PRN Deloria Lair, NP   4 mg at 09/06/22 2320   paliperidone (INVEGA SUSTENNA) injection 156 mg  156 mg Intramuscular Q28 days Jacayla Nordell, [name], MD   156 mg at 02/12/23 1615   paliperidone (INVEGA) 24 hr tablet 6 mg  6 mg Oral QHS Mistee Soliman T, MD   6 mg at 02/14/23 2059   senna-docusate (Senokot-S) tablet 2 tablet  2 tablet Oral QHS PRN [name], DO   2 tablet at 01/20/23 2133   temazepam (RESTORIL) capsule 15 mg  15 mg Oral QHS Johnmichael Melhorn T, MD   15 mg at 02/14/23 2101   ziprasidone (GEODON) injection 20 mg  20 mg Intramuscular Q12H PRN Annitta Fifield, [name], MD   20 mg at 07/08/22 1453    Lab Results:  Results for orders placed or performed during the hospital encounter of 03/19/22 (from the past 48 hour(s))  CBC with Differential/Platelet     Status: Abnormal   Collection Time: 02/14/23  1:23 PM  Result Value Ref Range   WBC 5.2 4.0 - 10.5 K/uL   RBC 4.50 4.22 - 5.81 MIL/uL   Hemoglobin 13.7 13.0 - 17.0 g/dL   HCT 40.0 39.0 - 52.0 %   MCV 88.9 80.0 - 100.0 fL   MCH 30.4 26.0 - 34.0 pg   MCHC 34.3 30.0 - 36.0 g/dL   RDW 13.3 11.5 - 15.5 %   Platelets 162 150 - 400 K/uL   nRBC 0.0 0.0 - 0.2 %   Neutrophils Relative % 68 %  Neutro Abs 3.5 1.7 - 7.7 K/uL   Lymphocytes Relative 19  %   Lymphs Abs 1.0 0.7 - 4.0 K/uL   Monocytes Relative 9 %   Monocytes Absolute 0.5 0.1 - 1.0 K/uL   Eosinophils Relative 1 %   Eosinophils Absolute 0.0 0.0 - 0.5 K/uL   Basophils Relative 3 %   Basophils Absolute 0.2 (H) 0.0 - 0.1 K/uL   Immature Granulocytes 0 %   Abs Immature Granulocytes 0.01 0.00 - 0.07 K/uL    Comment: Performed at [facility], [address] [place], [place] [address]    Blood Alcohol level:  Lab Results  Component Value Date   [place] <10 [id number]    Metabolic Disorder Labs: Lab Results  Component Value Date   HGBA1C 5.5 04/10/2022   MPG 111.15 04/10/2022   No results found for: "PROLACTIN" Lab Results  Component Value Date   CHOL 167 11/20/2021   TRIG 107 11/20/2021   HDL 26 (L) 11/20/2021   CHOLHDL 6.4 11/20/2021   VLDL 21 11/20/2021   LDLCALC 120 (H) 11/20/2021    Physical Findings: AIMS: Facial and Oral Movements Muscles of Facial Expression: None, normal Lips and Perioral Area: None, normal Jaw: None, normal Tongue: None, normal,Extremity Movements Upper (arms, wrists, hands, fingers): None, normal Lower (legs, knees, ankles, toes): None, normal, Trunk Movements Neck, shoulders, hips: None, normal, Overall Severity Severity of abnormal movements (highest score from questions above): None, normal Incapacitation due to abnormal movements: None, normal Patient's awareness of abnormal movements (rate only patient's report): No Awareness, Dental Status Current problems with teeth and/or dentures?: No Does patient usually wear dentures?: No  CIWA:    COWS:     Musculoskeletal: Strength & Muscle Tone: within normal limits Gait & Station: normal Patient leans: N/A  Psychiatric Specialty Exam:  Presentation  General Appearance:  Appropriate for Environment; Casual; Neat; Well Groomed  Eye Contact: Fair  Speech: Slow  Speech Volume: Decreased  Handedness: Left   Mood and Affect   Mood: Anxious  Affect: Congruent   Thought Process  Thought Processes: Goal Directed  Descriptions of Associations:Circumstantial  Orientation:Partial  Thought Content:Scattered  History of Schizophrenia/Schizoaffective disorder:Yes  Duration of Psychotic Symptoms:Greater than six months  Hallucinations:No data recorded Ideas of Reference:None  Suicidal Thoughts:No data recorded Homicidal Thoughts:No data recorded  Sensorium  Memory: Remote Good  Judgment: Fair  Insight: Fair   Community education officer  Concentration: Fair  Attention Span: Fair  Recall: [employer] of Knowledge: Fair  Language: Fair   Psychomotor Activity  Psychomotor Activity:No data recorded  Assets  Assets:No data recorded  Sleep  Sleep:No data recorded   Physical Exam: Physical Exam Vitals and nursing note reviewed.  Constitutional:      Appearance: Normal appearance.  HENT:     Head: Normocephalic and atraumatic.     Mouth/Throat:     Pharynx: Oropharynx is clear.  Eyes:     Pupils: Pupils are equal, round, and reactive to light.  Cardiovascular:     Rate and Rhythm: Normal rate and regular rhythm.  Pulmonary:     Effort: Pulmonary effort is normal.     Breath sounds: Normal breath sounds.  Abdominal:     General: Abdomen is flat.     Palpations: Abdomen is soft.  Musculoskeletal:        General: Normal range of motion.  Skin:    General: Skin is warm and dry.  Neurological:     General: No focal deficit present.  Mental Status: He is alert. Mental status is at baseline.  Psychiatric:        Attention and Perception: He is inattentive.        Mood and Affect: Mood normal. Affect is blunt.        Speech: He is noncommunicative.    Review of Systems  Unable to perform ROS: Language   Blood pressure 121/78, pulse 100, temperature 97.9 F (36.6 C), temperature source Oral, resp. rate 20, height 5\' 8"  (1.727 m), weight 85.8 kg, SpO2 99 %. Body mass  index is 28.76 kg/m.   Treatment Plan Summary: Plan no change to medication.  Spoke with pharmacy about changing his CBC absolute neutrophil count checks to every 2 weeks.  Anticipate if all goes well that we may be able to discharge him next week.  COVID test will be done prior to discharge.  Alethia Berthold, MD 02/15/2023, 1:36 PM

## 2023-02-15 NOTE — Progress Notes (Signed)
Patient pleasant this shift, watching hockey, singing in dayroom. Socializing appropriately with staff and peers. Denies SI, HI, aVH. Medication complaint.  Encouragement and support provided. Safety checks maintained. Medications given as prescribed. Pt receptive and remains safe on unit with q 15 min checks.

## 2023-02-15 NOTE — Progress Notes (Signed)
D- Patient alert and oriented x3. Affect animated/mood labile. Denies SI/HI/ AVH. He denies pain. Plans for discharge to ALF next week. A- Scheduled medications administered to patient, per MD orders. Support and encouragement provided.  Routine safety checks conducted every 15 minutes.  Patient informed to notify staff with problems or concerns. R- No adverse drug reactions noted. Patient compliant with medications and treatment plan. Patient receptive and cooperative. Patient interacts well with others on the unit. Patient contracts for safety and remains safe on the unit at this time.

## 2023-02-15 NOTE — Plan of Care (Signed)
  Problem: Coping: Goal: Ability to identify and develop effective coping behavior will improve Outcome: Progressing   Problem: Coping: Goal: Level of anxiety will decrease Outcome: Progressing   Problem: Safety: Goal: Ability to remain free from injury will improve Outcome: Progressing

## 2023-02-15 NOTE — Group Note (Signed)
LCSW Group Therapy Note  Group Date: 02/14/2023 Start Time: 1300 End Time: 1400   Type of Therapy and Topic:  Group Therapy - How To Cope with Nervousness about Discharge   Participation Level:  None   Description of Group This process group involved identification of patients' feelings about discharge. Some of them are scheduled to be discharged soon, while others are new admissions, but each of them was asked to share thoughts and feelings surrounding discharge from the hospital. One common theme was that they are excited at the prospect of going home, while another was that many of them are apprehensive about sharing why they were hospitalized. Patients were given the opportunity to discuss these feelings with their peers in preparation for discharge.  Therapeutic Goals  Patient will identify their overall feelings about pending discharge. Patient will think about how they might proactively address issues that they believe will once again arise once they get home (i.e. with parents). Patients will participate in discussion about having hope for change.   Summary of Patient Progress:  Patient unable to participate in group setting to due to expressive aphasia as resultof stroke\.  Therapeutic Modalities Cognitive Behavioral Therapy   [name] 02/15/2023  2:27 PM

## 2023-02-15 NOTE — Group Note (Signed)
Recreation Therapy Group Note   Group Topic:Emotion Expression  Group Date: 02/15/2023 Start Time: 1000 End Time: 1030 Facilitators: [name], LRT, CTRS Location:  Craft Room  Group Description: Gratitude Journaling. Patients and LRT discussed what gratitude means, how we can express it and what it means to [place], personally. LRT gave an education handout on the definition of gratitude that also gave different examples of gratitude exercises that they could try. One of the examples was "Gratitude Letter", which prompted you to write a letter to someone you appreciate. LRT played soft music while everyone wrote their letter. Once letter was completed, LRT encouraged people to read their letter, if they wanted to, or share who they wrote it to, at minimum. LRT and pts processed how showing gratitude towards themselves, and others can be applied to everyday life post-discharge.    Affect/Mood: Appropriate and Full range   Participation Level: Non-verbal   Participation Quality: Minimal Cues   Behavior: Distracted and Disinterested   Speech/Thought Process: Disorganized   Insight: Limited   Judgement: Limited   Modes of Intervention: Activity and Guided Discussion   Patient Response to Interventions:  Disengaged   Education Outcome:  In group clarification offered    Clinical Observations/Individualized Feedback: Adam [name] was not active in their participation of session activities and group discussion. Pt was fixated on his wrist and upset about not having a bracelet. Pt didn't interact with peers or LRT. Pt left early and did not return.  Plan: Continue to engage patient in RT group sessions 2-3x/week.   [name], LRT, CTRS 02/15/2023 10:39 AM
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
## 2023-02-16 NOTE — Progress Notes (Signed)
[facility] MD Progress Note  02/16/2023 1:04 PM Adam [name]  MRN:  [id number] Subjective: Follow-up with this patient with schizophrenia.  Completely stable.  No change at all to behavior.  Gets along well on the unit takes care of his ADLs does not have any aggression or dangerous behavior. 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)  Total Time spent with patient: 30 minutes  Past Psychiatric History: Past history of schizophrenia  Past Medical History:  Past Medical History:  Diagnosis Date   Myocardial infarction ([place])    Schizophrenia ([place])    Stroke ([place])     Past Surgical History:  Procedure Laterality Date   NO PAST SURGERIES     Family History:  Family History  Problem Relation Age of Onset   Hypertension Mother    Family Psychiatric  History: See previous Social History:  Social History   Substance and Sexual Activity  Alcohol Use Not Currently     Social History   Substance and Sexual Activity  Drug Use Not Currently    Social History   Socioeconomic History   Marital status: Single    Spouse name: Not on file   Number of children: Not on file   Years of education: Not on file   Highest education level: Not on file  Occupational History   Not on file  Tobacco Use   Smoking status: Never    Passive exposure: Never   Smokeless tobacco: Never  Vaping Use   Vaping Use: Unknown  Substance and Sexual Activity   Alcohol use: Not Currently   Drug use: Not Currently   Sexual activity: Not Currently  Other Topics Concern   Not  on file  Social History Narrative   Not on file   Social Determinants of Health   Financial Resource Strain: Not on file  Food Insecurity: Not on file  Transportation Needs: Not on file  Physical Activity: Not on file  Stress: Not on file  Social Connections: Not on file   Additional Social History:  Specify valuables returned:  (none)                      Sleep: Fair  Appetite:  Fair  Current Medications: Current Facility-Administered Medications  Medication Dose Route Frequency Provider Last Rate Last Admin   acetaminophen (TYLENOL) tablet 650 mg  650 mg Oral Q6H PRN Rashee Marschall T, MD   650 mg at 02/10/23 1247   alum & mag hydroxide-simeth (MAALOX/MYLANTA) 200-200-20 MG/5ML suspension 30 mL  30 mL Oral Q4H PRN Aradhana Gin T, MD   30 mL at 04/26/22 [id number]   aspirin EC tablet 81 mg  81 mg Oral Daily Lyall Faciane, [name], MD   81 mg at 02/16/23 0909   atorvastatin (LIPITOR) tablet 20 mg  20 mg Oral Daily Alan Riles T, MD   20 mg at 02/16/23 0908   atropine 1 % ophthalmic solution 1 drop  1 drop Sublingual QID [name], MD   1 drop at 02/16/23 0914   benztropine (COGENTIN) tablet 0.5 mg  0.5 mg Oral BID Jadarion Halbig  T, MD   0.5 mg at 02/16/23 0908   clonazePAM (KLONOPIN) tablet 1 mg  1 mg Oral TID PRN He, [name], MD   1 mg at 02/15/23 2130   cloZAPine (CLOZARIL) tablet 300 mg  300 mg Oral QHS Nicolina Hirt T, MD   300 mg at 02/15/23 2130   diphenhydrAMINE (BENADRYL) capsule 50 mg  50 mg Oral Q6H PRN Rochelle Nephew, [name], MD   50 mg at 01/28/23 2105   Or   diphenhydrAMINE (BENADRYL) injection 50 mg  50 mg Intramuscular Q6H PRN Yaminah Clayborn, [name], MD   50 mg at 01/01/23 2252   feeding supplement (ENSURE ENLIVE / ENSURE PLUS) liquid 237 mL  237 mL Oral BID BM Kyrene Longan T, MD   237 mL at 02/16/23 0913   gabapentin (NEURONTIN) capsule 300 mg  300 mg Oral TID Emmalynne Courtney, [name], MD   300 mg at 02/16/23 [id number]   haloperidol (HALDOL) tablet 2 mg  2 mg Oral TID Zadaya Cuadra, [name], MD   2 mg  at 02/16/23 [id number]   haloperidol (HALDOL) tablet 5 mg  5 mg Oral Q6H PRN Shirleymae Hauth, [name], MD   5 mg at 01/23/23 [id number]   Or   haloperidol lactate (HALDOL) injection 5 mg  5 mg Intramuscular Q6H PRN Milanya Sunderland, [name], MD   5 mg at 01/01/23 2252   lisinopril (ZESTRIL) tablet 5 mg  5 mg Oral Daily Edras Wilford, [name], MD   5 mg at 02/16/23 0911   magnesium hydroxide (MILK OF MAGNESIA) suspension 30 mL  30 mL Oral Daily PRN Tiago Humphrey T, MD   30 mL at 01/20/23 2133   metoprolol succinate (TOPROL-XL) 24 hr tablet 25 mg  25 mg Oral Daily Chrystel Barefield T, MD   25 mg at 02/16/23 0908   ondansetron (ZOFRAN) tablet 4 mg  4 mg Oral Q8H PRN Deloria Lair, NP   4 mg at 09/06/22 2320   paliperidone (INVEGA SUSTENNA) injection 156 mg  156 mg Intramuscular Q28 days Mauria Asquith, [name], MD   156 mg at 02/12/23 1615   paliperidone (INVEGA) 24 hr tablet 6 mg  6 mg Oral QHS Kiwan Gadsden T, MD   6 mg at 02/15/23 2130   senna-docusate (Senokot-S) tablet 2 tablet  2 tablet Oral QHS PRN [name], DO   2 tablet at 01/20/23 2133   temazepam (RESTORIL) capsule 15 mg  15 mg Oral QHS Yasmina Chico T, MD   15 mg at 02/15/23 2130   ziprasidone (GEODON) injection 20 mg  20 mg Intramuscular Q12H PRN Beverley Allender, [name], MD   20 mg at 07/08/22 1453    Lab Results:  Results for orders placed or performed during the hospital encounter of 03/19/22 (from the past 48 hour(s))  CBC with Differential/Platelet     Status: Abnormal   Collection Time: 02/14/23  1:23 PM  Result Value Ref Range   WBC 5.2 4.0 - 10.5 K/uL   RBC 4.50 4.22 - 5.81 MIL/uL   Hemoglobin 13.7 13.0 - 17.0 g/dL   HCT 40.0 39.0 - 52.0 %   MCV 88.9 80.0 - 100.0 fL   MCH 30.4 26.0 - 34.0 pg   MCHC 34.3 30.0 - 36.0 g/dL   RDW 13.3 11.5 - 15.5 %   Platelets 162 150 - 400 K/uL   nRBC 0.0 0.0 - 0.2 %   Neutrophils Relative % 68 %   Neutro Abs 3.5 1.7 - 7.7 K/uL   Lymphocytes  Relative 19 %   Lymphs Abs 1.0 0.7 - 4.0 K/uL   Monocytes Relative 9 %   Monocytes  Absolute 0.5 0.1 - 1.0 K/uL   Eosinophils Relative 1 %   Eosinophils Absolute 0.0 0.0 - 0.5 K/uL   Basophils Relative 3 %   Basophils Absolute 0.2 (H) 0.0 - 0.1 K/uL   Immature Granulocytes 0 %   Abs Immature Granulocytes 0.01 0.00 - 0.07 K/uL    Comment: Performed at [facility], [address] [place], [place] [address]    Blood Alcohol level:  Lab Results  Component Value Date   [facility] <10 [id number]    Metabolic Disorder Labs: Lab Results  Component Value Date   HGBA1C 5.5 04/10/2022   MPG 111.15 04/10/2022   No results found for: "PROLACTIN" Lab Results  Component Value Date   CHOL 167 11/20/2021   TRIG 107 11/20/2021   HDL 26 (L) 11/20/2021   CHOLHDL 6.4 11/20/2021   VLDL 21 11/20/2021   LDLCALC 120 (H) 11/20/2021    Physical Findings: AIMS: Facial and Oral Movements Muscles of Facial Expression: None, normal Lips and Perioral Area: None, normal Jaw: None, normal Tongue: None, normal,Extremity Movements Upper (arms, wrists, hands, fingers): None, normal Lower (legs, knees, ankles, toes): None, normal, Trunk Movements Neck, shoulders, hips: None, normal, Overall Severity Severity of abnormal movements (highest score from questions above): None, normal Incapacitation due to abnormal movements: None, normal Patient's awareness of abnormal movements (rate only patient's report): No Awareness, Dental Status Current problems with teeth and/or dentures?: No Does patient usually wear dentures?: No  CIWA:    COWS:     Musculoskeletal: Strength & Muscle Tone: within normal limits Gait & Station: normal Patient leans: N/A  Psychiatric Specialty Exam:  Presentation  General Appearance:  Appropriate for Environment; Casual; Neat; Well Groomed  Eye Contact: Fair  Speech: Slow  Speech Volume: Decreased  Handedness: Left   Mood and Affect  Mood: Anxious  Affect: Congruent   Thought Process  Thought Processes: Goal  Directed  Descriptions of Associations:Circumstantial  Orientation:Partial  Thought Content:Scattered  History of Schizophrenia/Schizoaffective disorder:Yes  Duration of Psychotic Symptoms:Greater than six months  Hallucinations:No data recorded Ideas of Reference:None  Suicidal Thoughts:No data recorded Homicidal Thoughts:No data recorded  Sensorium  Memory: Remote Good  Judgment: Fair  Insight: Fair   Community education officer  Concentration: Fair  Attention Span: Fair  Recall: [place] of Knowledge: Fair  Language: Fair   Psychomotor Activity  Psychomotor Activity:No data recorded  Assets  Assets:No data recorded  Sleep  Sleep:No data recorded   Physical Exam: Physical Exam Vitals reviewed.  Constitutional:      Appearance: Normal appearance.  HENT:     Head: Normocephalic and atraumatic.     Mouth/Throat:     Pharynx: Oropharynx is clear.  Eyes:     Pupils: Pupils are equal, round, and reactive to light.  Cardiovascular:     Rate and Rhythm: Normal rate and regular rhythm.  Pulmonary:     Effort: Pulmonary effort is normal.     Breath sounds: Normal breath sounds.  Abdominal:     General: Abdomen is flat.     Palpations: Abdomen is soft.  Musculoskeletal:        General: Normal range of motion.  Skin:    General: Skin is warm and dry.  Neurological:     General: No focal deficit present.     Mental Status: He is alert. Mental status is at baseline.  Psychiatric:        Mood and Affect: Mood normal.        Thought Content: Thought content normal.    Review of Systems  Unable to perform ROS: Language   Blood pressure 100/69, pulse 92, temperature 98 F (36.7 C), temperature source Oral, resp. rate 20, height 5\' 8"  (1.727 m), weight 85.8 kg, SpO2 99 %. Body mass index is 28.76 kg/m.   Treatment Plan Summary: Plan thanks to the dedicated effort of our social work team there appears to be a strong likelihood that discharge  might be achieved this upcoming week with an estimated date of Tuesday.  Alethia Berthold, MD 02/16/2023, 1:04 PM

## 2023-02-16 NOTE — Plan of Care (Signed)
D: Patient alert and oriented. Patient denies pain. Patient denies anxiety and depression. Patient denies SI/HI/AVH. Patient has been interacting at baseline with staff and peers throughout shift.  A: Scheduled medications administered to patient, per MD orders.  Support and encouragement provided to patient.  Q15 minute safety checks maintained.   R: Patient compliant with medication administration and treatment plan. No adverse drug reactions noted. Patient remains safe on the unit at this time. Problem: Education: Goal: Knowledge of General Education information will improve Description: Including pain rating scale, medication(s)/side effects and non-pharmacologic comfort measures Outcome: Progressing   Problem: Nutrition: Goal: Adequate nutrition will be maintained  Outcome: Progressing

## 2023-02-16 NOTE — Group Note (Signed)
AA/NA Group     Group Date: 02/16/2023 Start Time: [id number] End Time: 1445     Type of Therapy and Topic:  Group Therapy:    Participation Level:  Did Attend   Description of Group: AA/NA providers held group with patients to address SUD.   Summary of Patient Progress:    Patient attended AA/NA group.    Therapeutic Modalities:    Maryjane Hurter 02/16/2023  2:53 PM

## 2023-02-16 NOTE — Group Note (Signed)
[facility] LCSW Group Therapy Note   Group Date: 02/16/2023 Start Time: [id number] End Time: 1411   Type of Therapy/Topic:  Group Therapy:  Emotion Regulation  Participation Level:  None   Mood:  Description of Group:    The purpose of this group is to assist patients in learning to regulate negative emotions and experience positive emotions. Patients will be guided to discuss ways in which they have been vulnerable to their negative emotions. These vulnerabilities will be juxtaposed with experiences of positive emotions or situations, and patients challenged to use positive emotions to combat negative ones. Special emphasis will be placed on coping with negative emotions in conflict situations, and patients will process healthy conflict resolution skills.  Therapeutic Goals: Patient will identify two positive emotions or experiences to reflect on in order to balance out negative emotions:  Patient will label two or more emotions that they find the most difficult to experience:  Patient will be able to demonstrate positive conflict resolution skills through discussion or role plays:   Summary of Patient Progress: Patient was present in group.  Patient seemed preoccupied with the electrical wall sockets, stating "voltage" several times while pointing at them.  Patient then got up and reset the sockets in the craft room.  Patient did not respond to verbal redirections to not touch sockets or to not interrupt.  Patient also stated "Joe Biden" and the name of other staff.  Therapeutic Modalities:   Cognitive Behavioral Therapy Feelings Identification Dialectical Behavioral Therapy   Rozann Lesches, LCSW

## 2023-02-17 NOTE — Plan of Care (Signed)
  Problem: Education: Goal: Ability to state activities that reduce stress will improve Outcome: Progressing   Problem: Coping: Goal: Ability to identify and develop effective coping behavior will improve Outcome: Progressing   Problem: Self-Concept: Goal: Ability to identify factors that promote anxiety will improve Outcome: Progressing Goal: Level of anxiety will decrease Outcome: Progressing Goal: Ability to modify response to factors that promote anxiety will improve Outcome: Progressing   Problem: Education: Goal: Knowledge of General Education information will improve Description: Including pain rating scale, medication(s)/side effects and non-pharmacologic comfort measures Outcome: Progressing   Problem: Health Behavior/Discharge Planning: Goal: Ability to manage health-related needs will improve Outcome: Progressing   Problem: Clinical Measurements: Goal: Ability to maintain clinical measurements within normal limits will improve Outcome: Progressing Goal: Will remain free from infection Outcome: Progressing Goal: Diagnostic test results will improve Outcome: Progressing Goal: Respiratory complications will improve Outcome: Progressing Goal: Cardiovascular complication will be avoided Outcome: Progressing   Problem: Activity: Goal: Risk for activity intolerance will decrease Outcome: Progressing   Problem: Nutrition: Goal: Adequate nutrition will be maintained Outcome: Progressing   Problem: Coping: Goal: Level of anxiety will decrease Outcome: Progressing   Problem: Elimination: Goal: Will not experience complications related to bowel motility Outcome: Progressing Goal: Will not experience complications related to urinary retention Outcome: Progressing   Problem: Pain Managment: Goal: General experience of comfort will improve Outcome: Progressing   Problem: Safety: Goal: Ability to remain free from injury will improve Outcome: Progressing    Problem: Skin Integrity: Goal: Risk for impaired skin integrity will decrease Outcome: Progressing   Problem: Activity: Goal: Interest or engagement in leisure activities will improve Outcome: Progressing   Problem: Coping: Goal: Coping ability will improve Outcome: Progressing   Problem: Education: Goal: Will be free of psychotic symptoms Outcome: Progressing

## 2023-02-17 NOTE — Progress Notes (Signed)
D- Patient alert and oriented x 2-3. Affect animated/mood euthymic. Denies SI/HI/ AVH. He denies pain. Mother called nursing today with concerns about his discharge. She was reassured that he no longer needed care in an Adult [place] Unit. She agreed and wanted to thank staff for the help he has received. A- Scheduled medications administered to patient, per MD orders. Support and encouragement provided.  Routine safety checks conducted every 15 minutes.  Patient informed to notify staff with problems or concerns. R- No adverse drug reactions noted. Patient compliant with medications and treatment plan. Patient receptive and cooperative. Patient attended outside group today and interacts well with others on the unit. He contracts for safety and remains safe at this time.

## 2023-02-17 NOTE — Progress Notes (Signed)
[facility] MD Progress Note  02/17/2023 1:16 PM Adam [name]  MRN:  [id number] Subjective: Follow-up for this patient with schizophrenia.  No new complaints no change to behavior.  Patient seems to understand that there is a possible plan for discharge on Tuesday. 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)  Total Time spent with patient: 30 minutes  Past Psychiatric History: Past history of schizophrenia and stroke  Past Medical History:  Past Medical History:  Diagnosis Date   Myocardial infarction ([place])    Schizophrenia ([place])    Stroke ([place])     Past Surgical History:  Procedure Laterality Date   NO PAST SURGERIES     Family History:  Family History  Problem Relation Age of Onset   Hypertension Mother    Family Psychiatric  History: See previous Social History:  Social History   Substance and Sexual Activity  Alcohol Use Not Currently     Social History   Substance and Sexual Activity  Drug Use Not Currently    Social History   Socioeconomic History   Marital status: Single    Spouse name: Not on file   Number of children: Not on file   Years of education: Not on file   Highest education level: Not on file  Occupational History   Not on file  Tobacco Use   Smoking status: Never    Passive exposure: Never   Smokeless tobacco: Never  Vaping Use   Vaping Use: Unknown  Substance and Sexual Activity   Alcohol use: Not Currently   Drug use: Not Currently   Sexual activity: Not Currently  Other Topics Concern   Not on file  Social  History Narrative   Not on file   Social Determinants of Health   Financial Resource Strain: Not on file  Food Insecurity: Not on file  Transportation Needs: Not on file  Physical Activity: Not on file  Stress: Not on file  Social Connections: Not on file   Additional Social History:  Specify valuables returned:  (none)                      Sleep: Fair  Appetite:  Fair  Current Medications: Current Facility-Administered Medications  Medication Dose Route Frequency Provider Last Rate Last Admin   acetaminophen (TYLENOL) tablet 650 mg  650 mg Oral Q6H PRN Marlan Steward T, MD   650 mg at 02/10/23 1247   alum & mag hydroxide-simeth (MAALOX/MYLANTA) 200-200-20 MG/5ML suspension 30 mL  30 mL Oral Q4H PRN Deanda Ruddell T, MD   30 mL at 04/26/22 [id number]   aspirin EC tablet 81 mg  81 mg Oral Daily Erasto Sleight, [name], MD   81 mg at 02/17/23 [id number]   atorvastatin (LIPITOR) tablet 20 mg  20 mg Oral Daily Mithran Strike T, MD   20 mg at 02/17/23 0856   atropine 1 % ophthalmic solution 1 drop  1 drop Sublingual QID [name], MD   1 drop at 02/17/23 0856   benztropine (COGENTIN) tablet 0.5 mg  0.5 mg Oral BID Daylan Boggess, [name], MD   0.5  mg at 02/17/23 0854   clonazePAM (KLONOPIN) tablet 1 mg  1 mg Oral TID PRN He, [name], MD   1 mg at 02/15/23 2130   cloZAPine (CLOZARIL) tablet 300 mg  300 mg Oral QHS Virgen Belland T, MD   300 mg at 02/16/23 2119   diphenhydrAMINE (BENADRYL) capsule 50 mg  50 mg Oral Q6H PRN Jamier Urbas T, MD   50 mg at 01/28/23 2105   Or   diphenhydrAMINE (BENADRYL) injection 50 mg  50 mg Intramuscular Q6H PRN Brinley Treanor T, MD   50 mg at 01/01/23 2252   feeding supplement (ENSURE ENLIVE / ENSURE PLUS) liquid 237 mL  237 mL Oral BID BM Contina Strain T, MD   237 mL at 02/17/23 0857   gabapentin (NEURONTIN) capsule 300 mg  300 mg Oral TID Nikkole Placzek T, MD   300 mg at 02/17/23 0855   haloperidol (HALDOL) tablet 2 mg  2 mg Oral TID Tichina Koebel, [name], MD   2 mg at 02/17/23 0855    haloperidol (HALDOL) tablet 5 mg  5 mg Oral Q6H PRN Neytiri Asche T, MD   5 mg at 01/23/23 1832   Or   haloperidol lactate (HALDOL) injection 5 mg  5 mg Intramuscular Q6H PRN Anabel Lykins T, MD   5 mg at 01/01/23 2252   lisinopril (ZESTRIL) tablet 5 mg  5 mg Oral Daily Dallys Nowakowski T, MD   5 mg at 02/17/23 0855   magnesium hydroxide (MILK OF MAGNESIA) suspension 30 mL  30 mL Oral Daily PRN Tanganyika Bowlds T, MD   30 mL at 01/20/23 2133   metoprolol succinate (TOPROL-XL) 24 hr tablet 25 mg  25 mg Oral Daily Brinley Treanor T, MD   25 mg at 02/17/23 0854   ondansetron (ZOFRAN) tablet 4 mg  4 mg Oral Q8H PRN [name], NP   4 mg at 09/06/22 2320   paliperidone (INVEGA SUSTENNA) injection 156 mg  156 mg Intramuscular Q28 days Hildur Bayer T, MD   156 mg at 02/12/23 1615   paliperidone (INVEGA) 24 hr tablet 6 mg  6 mg Oral QHS Mikaylee Arseneau T, MD   6 mg at 02/16/23 2119   senna-docusate (Senokot-S) tablet 2 tablet  2 tablet Oral QHS PRN [name], DO   2 tablet at 01/20/23 2133   temazepam (RESTORIL) capsule 15 mg  15 mg Oral QHS [name] T, MD   15 mg at 02/16/23 2120   ziprasidone (GEODON) injection 20 mg  20 mg Intramuscular Q12H PRN Debraann Livingstone, [name], MD   20 mg at 07/08/22 1453    Lab Results: No results found for this or any previous visit (from the past 28 hour(s)).  Blood Alcohol level:  Lab Results  Component Value Date   ETH <10 [id number]    Metabolic Disorder Labs: Lab Results  Component Value Date   HGBA1C 5.5 04/10/2022   MPG 111.15 04/10/2022   No results found for: "PROLACTIN" Lab Results  Component Value Date   CHOL 167 11/20/2021   TRIG 107 11/20/2021   HDL 26 (L) 11/20/2021   CHOLHDL 6.4 11/20/2021   VLDL 21 11/20/2021   LDLCALC 120 (H) 11/20/2021    Physical Findings: AIMS: Facial and Oral Movements Muscles of Facial Expression: None, normal Lips and Perioral Area: None, normal Jaw: None, normal Tongue: None, normal,Extremity  Movements Upper (arms, wrists, hands, fingers): None, normal Lower (legs, knees, ankles, toes): None, normal, Trunk Movements Neck, shoulders,  hips: None, normal, Overall Severity Severity of abnormal movements (highest score from questions above): None, normal Incapacitation due to abnormal movements: None, normal Patient's awareness of abnormal movements (rate only patient's report): No Awareness, Dental Status Current problems with teeth and/or dentures?: No Does patient usually wear dentures?: No  CIWA:    COWS:     Musculoskeletal: Strength & Muscle Tone: within normal limits Gait & Station: normal Patient leans: N/A  Psychiatric Specialty Exam:  Presentation  General Appearance:  Appropriate for Environment; Casual; Neat; Well Groomed  Eye Contact: Fair  Speech: Slow  Speech Volume: Decreased  Handedness: Left   Mood and Affect  Mood: Anxious  Affect: Congruent   Thought Process  Thought Processes: Goal Directed  Descriptions of Associations:Circumstantial  Orientation:Partial  Thought Content:Scattered  History of Schizophrenia/Schizoaffective disorder:Yes  Duration of Psychotic Symptoms:Greater than six months  Hallucinations:No data recorded Ideas of Reference:None  Suicidal Thoughts:No data recorded Homicidal Thoughts:No data recorded  Sensorium  Memory: Remote Good  Judgment: Fair  Insight: Fair   Community education officer  Concentration: Fair  Attention Span: Fair  Recall: [place] of Knowledge: Fair  Language: Fair   Psychomotor Activity  Psychomotor Activity:No data recorded  Assets  Assets:No data recorded  Sleep  Sleep:No data recorded   Physical Exam: Physical Exam Vitals reviewed.  Constitutional:      Appearance: Normal appearance.  HENT:     Head: Normocephalic and atraumatic.     Mouth/Throat:     Pharynx: Oropharynx is clear.  Eyes:     Pupils: Pupils are equal, round, and reactive to  light.  Cardiovascular:     Rate and Rhythm: Normal rate and regular rhythm.  Pulmonary:     Effort: Pulmonary effort is normal.     Breath sounds: Normal breath sounds.  Abdominal:     General: Abdomen is flat.     Palpations: Abdomen is soft.  Musculoskeletal:        General: Normal range of motion.  Skin:    General: Skin is warm and dry.  Neurological:     General: No focal deficit present.     Mental Status: He is alert. Mental status is at baseline.  Psychiatric:        Attention and Perception: He is inattentive.        Mood and Affect: Mood normal. Affect is blunt.        Speech: He is noncommunicative.        Cognition and Memory: Cognition is impaired.    Review of Systems  Unable to perform ROS: Language   Blood pressure 118/89, pulse 98, temperature 97.8 F (36.6 C), temperature source Oral, resp. rate 16, height 5\' 8"  (1.727 m), weight 85.8 kg, SpO2 99 %. Body mass index is 28.76 kg/m.   Treatment Plan Summary: No change to medicine or current plan.  Preparations are underway for possible discharge Tuesday  Alethia Berthold, MD 02/17/2023, 1:16 PM

## 2023-02-17 NOTE — Progress Notes (Signed)
Adam [name] had a good evening he watched the game on TV interacting well with Peers and Staff compliant with medications. No adverse effects noted.  Q 15 minutes safety checks ongoing. Patient remains safe.

## 2023-02-17 NOTE — B[facility] Group Notes (Signed)
[facility] Group Notes:  (Nursing/MHT/Case Management/Adjunct)  Date:  02/17/2023  Time:  9:07 PM  Type of Therapy:   Wrap up  Participation Level:  Active  Participation Quality:  Appropriate  Affect:  Appropriate  Cognitive:  Alert  Insight:  Good  Engagement in Group:  Engaged  Modes of Intervention:  Support  Summary of Progress/Problems:  Nehemiah Settle 02/17/2023, 9:07 PM

## 2023-02-18 LAB — RESP PANEL BY RT-PCR (RSV, FLU A&B, COVID)  RVPGX2
Influenza A by PCR: NEGATIVE
Influenza B by PCR: NEGATIVE
Resp Syncytial Virus by PCR: NEGATIVE
SARS Coronavirus 2 by RT PCR: NEGATIVE

## 2023-02-18 NOTE — Group Note (Signed)
[facility] LCSW Group Therapy Note    Group Date: 02/18/2023 Start Time: 1300 End Time: 1400  Type of Therapy and Topic:  Group Therapy:  Overcoming Obstacles  Participation Level:  [facility] PARTICIPATION LEVEL: None  Description of Group:   In this group patients will be encouraged to explore what they see as obstacles to their own wellness and recovery. They will be guided to discuss their thoughts, feelings, and behaviors related to these obstacles. The group will process together ways to cope with barriers, with attention given to specific choices patients can make. Each patient will be challenged to identify changes they are motivated to make in order to overcome their obstacles. This group will be process-oriented, with patients participating in exploration of their own experiences as well as giving and receiving support and challenge from other group members.  Therapeutic Goals: 1. Patient will identify personal and current obstacles as they relate to admission. 2. Patient will identify barriers that currently interfere with their wellness or overcoming obstacles.  3. Patient will identify feelings, thought process and behaviors related to these barriers. 4. Patient will identify two changes they are willing to make to overcome these obstacles:    Summary of Patient Progress Patient unable to participate in group due to expressive aphasia as a result of stroke.    Therapeutic Modalities:   Cognitive Behavioral Therapy Solution Focused Therapy Motivational Interviewing Relapse Prevention Therapy   [name], [place]

## 2023-02-18 NOTE — Plan of Care (Signed)
D- Patient alert and oriented to person, place, and situation. Patient presented in a preoccupied, but pleasant mood on assessment stating that he slept good last night and had no complaints to voice to this Probation officer. Patient denied SI, HI, AVH, and pain at this time, although he did request PRN pain medicine from the other staff nurse on shift, first thing this morning. Patient also denied and signs/symptoms of depression and anxiety, by shaking his head and saying "no". Patient had no stated goals for today.  A- Scheduled medications administered to patient, per MD orders. Support and encouragement provided.  Routine safety checks conducted every 15 minutes.  Patient informed to notify staff with problems or concerns.  R- No adverse drug reactions noted. Patient contracts for safety at this time. Patient compliant with medications and treatment plan. Patient receptive, calm, and cooperative. Patient interacts well with others on the unit.  Patient remains safe at this time.  Problem: Education: Goal: Ability to state activities that reduce stress will improve Outcome: Progressing   Problem: Coping: Goal: Ability to identify and develop effective coping behavior will improve Outcome: Progressing   Problem: Self-Concept: Goal: Ability to identify factors that promote anxiety will improve Outcome: Progressing Goal: Level of anxiety will decrease Outcome: Progressing Goal: Ability to modify response to factors that promote anxiety will improve Outcome: Progressing   Problem: Education: Goal: Knowledge of General Education information will improve Description: Including pain rating scale, medication(s)/side effects and non-pharmacologic comfort measures Outcome: Progressing   Problem: Health Behavior/Discharge Planning: Goal: Ability to manage health-related needs will improve Outcome: Progressing   Problem: Clinical Measurements: Goal: Ability to maintain clinical measurements within  normal limits will improve Outcome: Progressing Goal: Will remain free from infection Outcome: Progressing Goal: Diagnostic test results will improve Outcome: Progressing Goal: Respiratory complications will improve Outcome: Progressing Goal: Cardiovascular complication will be avoided Outcome: Progressing   Problem: Activity: Goal: Risk for activity intolerance will decrease Outcome: Progressing   Problem: Nutrition: Goal: Adequate nutrition will be maintained Outcome: Progressing   Problem: Coping: Goal: Level of anxiety will decrease Outcome: Progressing   Problem: Elimination: Goal: Will not experience complications related to bowel motility Outcome: Progressing Goal: Will not experience complications related to urinary retention Outcome: Progressing   Problem: Pain Managment: Goal: General experience of comfort will improve Outcome: Progressing   Problem: Safety: Goal: Ability to remain free from injury will improve Outcome: Progressing   Problem: Skin Integrity: Goal: Risk for impaired skin integrity will decrease Outcome: Progressing   Problem: Activity: Goal: Interest or engagement in leisure activities will improve Outcome: Progressing   Problem: Coping: Goal: Coping ability will improve Outcome: Progressing   Problem: Education: Goal: Will be free of psychotic symptoms Outcome: Progressing

## 2023-02-18 NOTE — Progress Notes (Signed)
[facility] MD Progress Note  02/18/2023 3:28 PM Adam [name]  MRN:  [id number] Subjective: Patient seen and chart reviewed.  62 year old man with schizophrenia very stable.  No new behavior problems no new complaints. 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)  Total Time spent with patient: 30 minutes  Past Psychiatric History: Past history of longstanding schizophrenia complicated by stroke with aphasia  Past Medical History:  Past Medical History:  Diagnosis Date   Myocardial infarction ([place])    Schizophrenia ([place])    Stroke ([place])     Past Surgical History:  Procedure Laterality Date   NO PAST SURGERIES     Family History:  Family History  Problem Relation Age of Onset   Hypertension Mother    Family Psychiatric  History: See previous Social History:  Social History   Substance and Sexual Activity  Alcohol Use Not Currently     Social History   Substance and Sexual Activity  Drug Use Not Currently    Social History   Socioeconomic History   Marital status: Single    Spouse name: Not on file   Number of children: Not on file   Years of education: Not on file   Highest education level: Not on file  Occupational History   Not on file  Tobacco Use   Smoking status: Never    Passive exposure: Never   Smokeless tobacco: Never  Vaping Use   Vaping Use: Unknown  Substance and Sexual Activity   Alcohol use: Not Currently   Drug use: Not Currently   Sexual activity: Not Currently  Other Topics Concern   Not on file  Social History  Narrative   Not on file   Social Determinants of Health   Financial Resource Strain: Not on file  Food Insecurity: Not on file  Transportation Needs: Not on file  Physical Activity: Not on file  Stress: Not on file  Social Connections: Not on file   Additional Social History:  Specify valuables returned:  (none)                      Sleep: Fair  Appetite:  Fair  Current Medications: Current Facility-Administered Medications  Medication Dose Route Frequency Provider Last Rate Last Admin   acetaminophen (TYLENOL) tablet 650 mg  650 mg Oral Q6H PRN Eva Griffo, [name], MD   650 mg at 02/18/23 0747   aspirin EC tablet 81 mg  81 mg Oral Daily Talbert Trembath, [name], MD   81 mg at 02/18/23 0836   atorvastatin (LIPITOR) tablet 20 mg  20 mg Oral Daily Avantae Bither T, MD   20 mg at 02/18/23 0835   atropine 1 % ophthalmic solution 1 drop  1 drop Sublingual QID [name], MD   1 drop at 02/18/23 1209   benztropine (COGENTIN) tablet 0.5 mg  0.5 mg Oral BID Aigner Horseman T, MD   0.5 mg at 02/18/23 0835   clonazePAM (KLONOPIN) tablet 1 mg  1 mg Oral TID PRN He, [name], MD   1 mg at 02/15/23 2130   cloZAPine (CLOZARIL) tablet 300 mg  300 mg Oral QHS Ashlie Mcmenamy T, MD   300 mg at 02/17/23 2111   feeding supplement (ENSURE ENLIVE / ENSURE PLUS) liquid 237 mL  237 mL Oral BID BM Jedidiah Demartini T, MD   237 mL at 02/17/23 1504   gabapentin (NEURONTIN) capsule 300 mg  300 mg Oral TID Cindi Ghazarian T, MD   300 mg at 02/18/23 0900   haloperidol (HALDOL) tablet 2 mg  2 mg Oral TID Lollie Gunner T, MD   2 mg at 02/18/23 0900   lisinopril (ZESTRIL) tablet 5 mg  5 mg Oral Daily Lulia Schriner T, MD   5 mg at 02/18/23 [id number]   metoprolol succinate (TOPROL-XL) 24 hr tablet 25 mg  25 mg Oral Daily Ivanell Deshotel T, MD   25 mg at 02/18/23 0942   paliperidone (INVEGA SUSTENNA) injection 156 mg  156 mg Intramuscular Q28 days Masaki Rothbauer T, MD   156 mg at 02/12/23 1615   paliperidone (INVEGA) 24 hr tablet 6 mg  6 mg  Oral QHS Livana Yerian T, MD   6 mg at 02/17/23 2112   senna-docusate (Senokot-S) tablet 2 tablet  2 tablet Oral QHS PRN [name], DO   2 tablet at 01/20/23 2133   temazepam (RESTORIL) capsule 15 mg  15 mg Oral QHS Avynn Klassen, [name], MD   15 mg at 02/17/23 2112    Lab Results: No results found for this or any previous visit (from the past 48 hour(s)).  Blood Alcohol level:  Lab Results  Component Value Date   ETH <10 [id number]    Metabolic Disorder Labs: Lab Results  Component Value Date   HGBA1C 5.5 04/10/2022   MPG 111.15 04/10/2022   No results found for: "PROLACTIN" Lab Results  Component Value Date   CHOL 167 11/20/2021   TRIG 107 11/20/2021   HDL 26 (L) 11/20/2021   CHOLHDL 6.4 11/20/2021   VLDL 21 11/20/2021   LDLCALC 120 (H) 11/20/2021    Physical Findings: AIMS: Facial and Oral Movements Muscles of Facial Expression: None, normal Lips and Perioral Area: None, normal Jaw: None, normal Tongue: None, normal,Extremity Movements Upper (arms, wrists, hands, fingers): None, normal Lower (legs, knees, ankles, toes): None, normal, Trunk Movements Neck, shoulders, hips: None, normal, Overall Severity Severity of abnormal movements (highest score from questions above): None, normal Incapacitation due to abnormal movements: None, normal Patient's awareness of abnormal movements (rate only patient's report): No Awareness, Dental Status Current problems with teeth and/or dentures?: No Does patient usually wear dentures?: No  CIWA:    COWS:     Musculoskeletal: Strength & Muscle Tone: within normal limits Gait & Station: normal Patient leans: N/A  Psychiatric Specialty Exam:  Presentation  General Appearance:  Appropriate for Environment; Casual; Neat; Well Groomed  Eye Contact: Fair  Speech: Slow  Speech Volume: Decreased  Handedness: Left   Mood and Affect  Mood: Anxious  Affect: Congruent   Thought Process  Thought  Processes: Goal Directed  Descriptions of Associations:Circumstantial  Orientation:Partial  Thought Content:Scattered  History of Schizophrenia/Schizoaffective disorder:Yes  Duration of Psychotic Symptoms:Greater than six months  Hallucinations:No data recorded Ideas of Reference:None  Suicidal Thoughts:No data recorded Homicidal Thoughts:No data recorded  Sensorium  Memory: Remote Good  Judgment: Fair  Insight: Fair   Materials engineer: Fair  Attention Span: Fair  Recall: [place] of Knowledge: Fair  Language: Fair   Psychomotor Activity  Psychomotor Activity:No data recorded  Assets  Assets:No data recorded  Sleep  Sleep:No data recorded   Physical Exam: Physical Exam Vitals and nursing note reviewed.  Constitutional:      Appearance: Normal appearance.  HENT:     Head: Normocephalic and atraumatic.     Mouth/Throat:     Pharynx: Oropharynx is clear.  Eyes:     Pupils: Pupils are equal, round, and reactive to light.  Cardiovascular:     Rate and Rhythm: Normal rate and regular rhythm.  Pulmonary:     Effort: Pulmonary effort is normal.     Breath sounds: Normal breath sounds.  Abdominal:     General: Abdomen is flat.     Palpations: Abdomen is soft.  Musculoskeletal:        General: Normal range of motion.  Skin:    General: Skin is warm and dry.  Neurological:     General: No focal deficit present.     Mental Status: He is alert. Mental status is at baseline.  Psychiatric:        Attention and Perception: Attention normal.        Mood and Affect: Mood normal. Affect is blunt.        Speech: He is noncommunicative.    Review of Systems  Unable to perform ROS: Language   Blood pressure 120/70, pulse 98, temperature 98.2 F (36.8 C), temperature source Oral, resp. rate 18, height 5\' 8"  (1.727 m), weight 85.8 kg, SpO2 100 %. Body mass index is 28.76 kg/m.   Treatment Plan Summary: Medication management  and Plan no change to medication.  We are all optimistic that everything will proceed as planned for discharge to a new group home tomorrow.  We are making arrangements so that medications will be available.  COVID test will be done today and I will also redo another CBC if it would be helpful to make sure there is enough time for his clozapine to be continued.  Patient aware of the plan and is behaving appropriately.  Alethia Berthold, MD 02/18/2023, 3:28 PM

## 2023-02-18 NOTE — B[facility] Group Notes (Signed)
[facility] Group Notes:  (Nursing/MHT/Case Management/Adjunct)  Date:  02/18/2023  Time:  10:00 AM  Type of Therapy:   Community Meeting  Participation Level:  Active  Participation Quality:  Appropriate  Affect:  Appropriate  Cognitive:  Appropriate  Insight:  Appropriate  Engagement in Group:  Engaged  Modes of Intervention:  Discussion, Education, and Support  Summary of Progress/Problems:  Adam [name] 02/18/2023, 10:00 AM

## 2023-02-18 NOTE — Plan of Care (Signed)
  Problem: Self-Concept: Goal: Level of anxiety will decrease Outcome: Progressing   Problem: Clinical Measurements: Goal: Will remain free from infection Outcome: Progressing   Problem: Nutrition: Goal: Adequate nutrition will be maintained Outcome: Progressing   Problem: Coping: Goal: Level of anxiety will decrease Outcome: Progressing

## 2023-02-18 NOTE — Progress Notes (Signed)
Patient is in a pleasant and upbeat mood.  He is med compliant and took his meds without incident. Has been active in the dayroom watching TV with his peers.  Denies si hi avh.      C Butler-Nicholson, LPN

## 2023-02-18 NOTE — B[facility] Group Notes (Signed)
[facility] Group Notes:  (Nursing/MHT/Case Management/Adjunct)  Date:  02/18/2023  Time:  8:52 PM  Type of Therapy:  Group Therapy  Participation Level:  Did Not Attend   [name],Tonji Elliff E 02/18/2023, 8:52 PM

## 2023-02-18 NOTE — Group Note (Signed)
Recreation Therapy Group Note   Group Topic:Goal Setting  Group Date: 02/18/2023 Start Time: 1000 End Time: 1045 Facilitators: [name], LRT, CTRS Location:  Craft Room  Group Description: Scientist, physiological. Patients were given many different magazines, a glue stick, markers, and a piece of cardstock paper. LRT and pts discussed the importance of having goals in life. LRT and pts discussed the difference between short-term and long-term goals. LRT encouraged pts to create a vision board, with images they picked and then cut out by LRT from the magazine, for themselves, that capture their short and long-term goals. On the back of the paper, pt encouraged to write 3 different coping skills that can help them reach those goals. LRT encouraged pts to show and explain their vision board to the group once complete. LRT offered to laminate vision board once dry and complete.   Affect/Mood: Appropriate   Participation Level: Non-verbal   Participation Quality: Minimal Cues   Behavior: Appropriate and Calm   Speech/Thought Process: Organized   Insight: Limited   Judgement: Limited   Modes of Intervention: Activity and Guided Discussion   Patient Response to Interventions:  Engaged and Receptive   Education Outcome:  In group clarification offered    Clinical Observations/Individualized Feedback: Adam [name] was active in their participation of session activities and group discussion. Pt glued a picture of a dog on his vision board. When LRT asked pt a question, he did not respond. Pt sat still and identified picture in magazine while in group. Pt was calm and quiet throughout session.  Plan: Continue to engage patient in RT group sessions 2-3x/week.   [name], LRT, CTRS 02/18/2023 11:01 AM

## 2023-02-18 NOTE — Progress Notes (Signed)
Patient tolerated Covid swab well, without any issues. Patient remains in his room and safe on the unit at this time.

## 2023-02-19 ENCOUNTER — Other Ambulatory Visit: Payer: Self-pay

## 2023-02-19 LAB — CBC WITH DIFFERENTIAL/PLATELET
Abs Immature Granulocytes: 0.01 10*3/uL (ref 0.00–0.07)
Basophils Absolute: 0.2 10*3/uL — ABNORMAL HIGH (ref 0.0–0.1)
Basophils Relative: 3 %
Eosinophils Absolute: 0 10*3/uL (ref 0.0–0.5)
Eosinophils Relative: 0 %
HCT: 43 % (ref 39.0–52.0)
Hemoglobin: 14.8 g/dL (ref 13.0–17.0)
Immature Granulocytes: 0 %
Lymphocytes Relative: 21 %
Lymphs Abs: 1.1 10*3/uL (ref 0.7–4.0)
MCH: 31.1 pg (ref 26.0–34.0)
MCHC: 34.4 g/dL (ref 30.0–36.0)
MCV: 90.3 fL (ref 80.0–100.0)
Monocytes Absolute: 0.5 10*3/uL (ref 0.1–1.0)
Monocytes Relative: 10 %
Neutro Abs: 3.2 10*3/uL (ref 1.7–7.7)
Neutrophils Relative %: 66 %
Platelets: 191 10*3/uL (ref 150–400)
RBC: 4.76 MIL/uL (ref 4.22–5.81)
RDW: 13.2 % (ref 11.5–15.5)
WBC: 5 10*3/uL (ref 4.0–10.5)
nRBC: 0 % (ref 0.0–0.2)

## 2023-02-19 MED ORDER — PALIPERIDONE ER 6 MG PO TB24: 6.0000 mg | ORAL_TABLET | Freq: Every day | ORAL | 1 refills | Status: DC

## 2023-02-19 MED ORDER — CLOZAPINE 100 MG PO TABS: 300.0000 mg | ORAL_TABLET | Freq: Every day | ORAL | 1 refills | Status: DC

## 2023-02-19 MED ORDER — ATROPINE SULFATE 1 % OP SOLN: 1.0000 [drp] | Freq: Four times a day (QID) | OPHTHALMIC | 1 refills | Status: DC

## 2023-02-19 MED ORDER — LISINOPRIL 5 MG PO TABS
5.0000 mg | ORAL_TABLET | Freq: Every day | ORAL | 0 refills | Status: AC
  Filled 2023-02-19: qty 10, 10d supply, fill #0

## 2023-02-19 MED ORDER — METOPROLOL SUCCINATE ER 25 MG PO TB24: 25.0000 mg | ORAL_TABLET | Freq: Every day | ORAL | 1 refills | Status: DC

## 2023-02-19 MED ORDER — ATORVASTATIN CALCIUM 20 MG PO TABS
20.0000 mg | ORAL_TABLET | Freq: Every day | ORAL | 0 refills | Status: AC
  Filled 2023-02-19: qty 10, 10d supply, fill #0

## 2023-02-19 MED ORDER — BENZTROPINE MESYLATE 0.5 MG PO TABS
0.5000 mg | ORAL_TABLET | Freq: Two times a day (BID) | ORAL | 0 refills | Status: AC
  Filled 2023-02-19: qty 20, 10d supply, fill #0

## 2023-02-19 MED ORDER — SENNOSIDES-DOCUSATE SODIUM 8.6-50 MG PO TABS: 2.0000 | ORAL_TABLET | Freq: Every evening | ORAL | 1 refills | Status: DC | PRN

## 2023-02-19 MED ORDER — ATORVASTATIN CALCIUM 20 MG PO TABS: 20.0000 mg | ORAL_TABLET | Freq: Every day | ORAL | 1 refills | Status: DC

## 2023-02-19 MED ORDER — BENZTROPINE MESYLATE 0.5 MG PO TABS: 0.5000 mg | ORAL_TABLET | Freq: Two times a day (BID) | ORAL | 1 refills | Status: DC

## 2023-02-19 MED ORDER — CLONAZEPAM 1 MG PO TABS: 1.0000 mg | ORAL_TABLET | Freq: Three times a day (TID) | ORAL | 1 refills | Status: AC | PRN

## 2023-02-19 MED ORDER — PALIPERIDONE PALMITATE ER 156 MG/ML IM SUSY: 156.0000 mg | PREFILLED_SYRINGE | INTRAMUSCULAR | 1 refills | Status: AC

## 2023-02-19 MED ORDER — SENNOSIDES-DOCUSATE SODIUM 8.6-50 MG PO TABS
2.0000 | ORAL_TABLET | Freq: Every evening | ORAL | 0 refills | Status: AC | PRN
  Filled 2023-02-19: qty 20, 10d supply, fill #0

## 2023-02-19 MED ORDER — HALOPERIDOL 2 MG PO TABS: 2.0000 mg | ORAL_TABLET | Freq: Three times a day (TID) | ORAL | 1 refills | Status: DC

## 2023-02-19 MED ORDER — HALOPERIDOL 2 MG PO TABS
2.0000 mg | ORAL_TABLET | Freq: Three times a day (TID) | ORAL | 0 refills | Status: AC
  Filled 2023-02-19: qty 30, 10d supply, fill #0

## 2023-02-19 MED ORDER — LISINOPRIL 5 MG PO TABS: 5.0000 mg | ORAL_TABLET | Freq: Every day | ORAL | 1 refills | Status: DC

## 2023-02-19 MED ORDER — GABAPENTIN 300 MG PO CAPS: 300.0000 mg | ORAL_CAPSULE | Freq: Three times a day (TID) | ORAL | 1 refills | Status: DC

## 2023-02-19 MED ORDER — GABAPENTIN 300 MG PO CAPS
300.0000 mg | ORAL_CAPSULE | Freq: Three times a day (TID) | ORAL | 0 refills | Status: AC
  Filled 2023-02-19: qty 30, 10d supply, fill #0

## 2023-02-19 MED ORDER — ASPIRIN 81 MG PO TBEC
81.0000 mg | DELAYED_RELEASE_TABLET | Freq: Every day | ORAL | 0 refills | Status: AC
  Filled 2023-02-19: qty 10, 10d supply, fill #0

## 2023-02-19 MED ORDER — ASPIRIN 81 MG PO TBEC: 81.0000 mg | DELAYED_RELEASE_TABLET | Freq: Every day | ORAL | 1 refills | Status: DC

## 2023-02-19 MED ORDER — ATROPINE SULFATE 1 % OP SOLN
1.0000 [drp] | Freq: Four times a day (QID) | OPHTHALMIC | 0 refills | Status: AC
  Filled 2023-02-19: qty 5, 25d supply, fill #0

## 2023-02-19 MED ORDER — CLOZAPINE 100 MG PO TABS
300.0000 mg | ORAL_TABLET | Freq: Every day | ORAL | 0 refills | Status: AC
  Filled 2023-02-19: qty 30, 10d supply, fill #0

## 2023-02-19 MED ORDER — METOPROLOL SUCCINATE ER 25 MG PO TB24
25.0000 mg | ORAL_TABLET | Freq: Every day | ORAL | 0 refills | Status: AC
  Filled 2023-02-19: qty 10, 10d supply, fill #0

## 2023-02-19 MED ORDER — TEMAZEPAM 15 MG PO CAPS: 15.0000 mg | ORAL_CAPSULE | Freq: Every day | ORAL | 1 refills | Status: AC

## 2023-02-19 MED ORDER — PALIPERIDONE ER 6 MG PO TB24
6.0000 mg | ORAL_TABLET | Freq: Every day | ORAL | 0 refills | Status: AC
  Filled 2023-02-19: qty 10, 10d supply, fill #0

## 2023-02-19 NOTE — Group Note (Signed)
[facility] LCSW Group Therapy Note   Group Date: 02/19/2023 Start Time: 1300 End Time: 1400  Type of Therapy/Topic:  Group Therapy:  Feelings about Diagnosis  Participation Level:  Minimal    Description of Group:    This group will allow patients to explore their thoughts and feelings about diagnoses they have received. Patients will be guided to explore their level of understanding and acceptance of these diagnoses. Facilitator will encourage patients to process their thoughts and feelings about the reactions of others to their diagnosis, and will guide patients in identifying ways to discuss their diagnosis with significant others in their lives. This group will be process-oriented, with patients participating in exploration of their own experiences as well as giving and receiving support and challenge from other group members.   Therapeutic Goals: 1. Patient will demonstrate understanding of diagnosis as evidence by identifying two or more symptoms of the disorder:  2. Patient will be able to express two feelings regarding the diagnosis 3. Patient will demonstrate ability to communicate their needs through discussion and/or role plays  Summary of Patient Progress: Pt was present for the majority of the group process. He identified his favorite season as winter and contributed "Illuminati" to the general conversation.   Therapeutic Modalities:   Cognitive Behavioral Therapy Brief Therapy Feelings Identification    Shirl Harris, LCSW

## 2023-02-19 NOTE — Group Note (Signed)
Recreation Therapy Group Note   Group Topic:Healthy Support Systems  Group Date: 02/19/2023 Start Time: 1000 End Time: 1055 Facilitators: [name], LRT, CTRS Location:  Craft Room  Group Description: Straw Bridge. Patients were given 10 plastic drinking straws and an equal length of masking tape. Using the materials provided, patients were instructed to build a free-standing bridge-like structure to suspend an everyday item (ex: deck of cards) off the floor or table surface. All materials were required to be used in Conservation officer, nature. LRT facilitated post-activity discussion reviewing the importance of having strong and healthy support systems in our lives. LRT discussed how the people in our lives serve as the tape and the book we placed on top of our straw structure are the stressors we face in daily life. LRT and pts discussed what happens in our life when things get too heavy for [place], and we don't have strong supports outside of the hospital. Pt shared 2 of their healthy supports aloud in the group.   Affect/Mood: Appropriate   Participation Level: Minimal   Participation Quality: Minimal Cues   Behavior: Alert and Appropriate   Speech/Thought Process: Incoherent   Insight: Limited   Judgement: Limited   Modes of Intervention: Activity and Guided Discussion   Patient Response to Interventions:  Disengaged   Education Outcome:  In group clarification offered    Clinical Observations/Individualized Feedback: Jordon was somewhat active in their participation of session activities and group discussion. Pt completed the activity appropriately, however left the room and was not present for the post-activity processing piece.    Plan: Continue to engage patient in RT group sessions 2-3x/week.   [name], LRT, CTRS 02/19/2023 11:24 AM

## 2023-02-19 NOTE — [facility]HH Suicide Risk Assessment (Signed)
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 (CVA)   Total Time spent with patient: 30 minutes  Musculoskeletal: Strength & Muscle Tone: within normal limits Gait & Station: normal Patient leans: N/A  Psychiatric Specialty Exam  Presentation  General Appearance:  Appropriate for Environment; Casual; Neat; Well Groomed  Eye Contact: Fair  Speech: Slow  Speech Volume: Decreased  Handedness: Left   Mood and Affect  Mood: Anxious  Duration of Depression Symptoms: No data recorded Affect: Congruent   Thought Process  Thought Processes: Goal Directed  Descriptions of Associations:Circumstantial  Orientation:Partial  Thought Content:Scattered  History of Schizophrenia/Schizoaffective disorder:Yes  Duration of Psychotic Symptoms:Greater than six months  Hallucinations:No data recorded Ideas of Reference:None  Suicidal Thoughts:No data recorded Homicidal Thoughts:No data recorded  Sensorium  Memory: Remote Good  Judgment: Fair  Insight: Fair   Community education officer  Concentration: Fair  Attention Span: Fair  Recall: [employer] of Knowledge: Fair  Language: Fair   Psychomotor Activity  Psychomotor Activity:No data recorded  Assets  Assets:No data recorded  Sleep  Sleep:No data recorded  Physical Exam: Physical Exam Vitals and nursing note reviewed.  Constitutional:      Appearance: Normal appearance.  HENT:     Head: Normocephalic and atraumatic.      Mouth/Throat:     Pharynx: Oropharynx is clear.  Eyes:     Pupils: Pupils are equal, round, and reactive to light.  Cardiovascular:     Rate and Rhythm: Normal rate and regular rhythm.  Pulmonary:     Effort: Pulmonary effort is normal.     Breath sounds: Normal breath sounds.  Abdominal:     General: Abdomen is flat.     Palpations: Abdomen is soft.  Musculoskeletal:        General: Normal range of motion.  Skin:    General: Skin is warm and dry.  Neurological:     General: No focal deficit present.     Mental Status: He is alert. Mental status is at baseline.  Psychiatric:        Attention and Perception: He is inattentive.        Mood and Affect: Mood normal. Affect is blunt.        Speech: He is noncommunicative.    Review of Systems  Unable to perform ROS: Language   Blood pressure (!) 135/92, pulse 89, temperature 97.9 F (36.6 C), temperature source Oral, resp. rate 18, height 5\' 8"  (1.727 m), weight 85.8 kg, SpO2 100 %. Body mass index is 28.76 kg/m.  Mental Status Per Nursing Assessment::   On Admission:  NA  Demographic Factors:  Male and Caucasian  Loss Factors: Decline in physical health  Historical Factors: Impulsivity  Risk Reduction Factors:   Sense of responsibility to family, Positive social support, and Positive therapeutic relationship  Continued Clinical Symptoms:  Schizophrenia:   Paranoid or undifferentiated type  Cognitive Features That Contribute To Risk:  Loss of executive function    Suicide Risk:  Minimal: No identifiable suicidal ideation.  Patients presenting with no risk factors but with morbid  ruminations; may be classified as minimal risk based on the severity of the depressive symptoms   Follow-up Information     [facility] Follow up.   Why: Home has a primary care physician and psychiatrist scheduled to come to the home every 5 weeks, they will assist with setting up care.                 Plan Of Care/Follow-up recommendations:  Other:  Patient will be discharged to a group home with follow-up to be arranged through their local provider.  Patient has been educated although it is always hard to tell how much he understands.  He is to stay on medicine.  At the time of discharge there is no evidence of dangerousness to self or aggression or violence.  Affect appears to be upbeat.  Alethia Berthold, MD 02/19/2023, 9:14 AM

## 2023-02-19 NOTE — Discharge Summary (Signed)
Physician Discharge Summary Note  Patient:  Adam [name] is an 62 y.o., male MRN:  [id number] DOB:  07/21/1961 Patient phone:  [phone] (home)  Patient address:   [address] [place] [address],  Total Time spent with patient: 30 minutes  Date of Admission:  03/19/2022 Date of Discharge: 02/19/2023  Reason for Admission: Patient was admitted to the psychiatric unit after an extended stay on the medical unit which itself followed an extended stay in the emergency room.  His initial presentation to the hospital back in August 2022 was for psychiatric concerns with wandering behavior agitated behavior complaints of aggression at a group home.  Patient was initially kept in the emergency room for several months as we did not think he needed inpatient psychiatric treatment but then had a medical emergency and was transferred to the medical unit.  Once he was medically cleared there was eventually a discussion among hospital administration that he would be better served on psychiatry and has been on our unit since then.  Patient has a long history of schizophrenia and a somewhat more recent but still lengthy history of cognitive and speech impairment due to a massive stroke.  Since being on our unit his behavior has largely been adequate and well controlled.  There have been a few instances of any physical aggression and then very minor and redirectable.  Patient does not tend to get hostile but instead will sometimes sulk when he is unhappy.  When he is loudly vocalizing it seems to be more often when he is in a good mood.  He has been cooperative with medication and has had no further medical crises.  He has been continued on his antipsychotic medication including clozapine.  Patient has been on Clozapine since the spring 2023 and will be coming up on a year soon with no abnormal absolute neutrophil counts.  He is currently on every 2-week checks.  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  History of major stroke approximately 8 years ago caused a longstanding severe expressive aphasia.  Past Medical History:  Past Medical History:  Diagnosis Date   Myocardial infarction ([place])    Schizophrenia ([place])    Stroke [facility])     Past Surgical History:  Procedure Laterality Date   NO PAST SURGERIES     Family History:  Family History  Problem Relation Age of Onset   Hypertension Mother    Family Psychiatric  History: None Social History:  Social History   Substance and Sexual Activity  Alcohol Use Not Currently     Social History   Substance and Sexual Activity  Drug Use Not Currently    Social History   Socioeconomic History   Marital status: Single    Spouse name: Not on file   Number of children: Not on file   Years of education: Not on file   Highest education level: Not on file  Occupational History   Not on file  Tobacco Use  Smoking status: Never    Passive exposure: Never   Smokeless tobacco: Never  Vaping Use   Vaping Use: Unknown  Substance and Sexual Activity   Alcohol use: Not Currently   Drug use: Not Currently   Sexual activity: Not Currently  Other Topics Concern   Not on file  Social History Narrative   Not on file   Social Determinants of Health   Financial Resource Strain: Not on file  Food Insecurity: Not on file  Transportation Needs: Not on file  Physical  Activity: Not on file  Stress: Not on file  Social Connections: Not on file    Hospital Course: Since being on the psychiatric unit from much of the past year patient has had no major behavior problems.  His symptoms and behavior have been stable.  It is not possible to determine whether he could be delusional but he does not generally seemed to be responding to hallucinations and is usually in what appears to be a reasonably good mood.  Patient is able to understand most or possibly all of what is said to him but is not able to communicate verbally either with speech or writing.  He has been compliant with medication.  He has had no further cardiac issues.  His entire stay here has been mostly devoted to finding placement which has been a much more difficult process than I think any of [place] thought was necessary.  At long last we have found a group home willing to work with him.  Patient is aware of the plan and has been informed and congratulated by staff and appears willing and upbeat about the discharge.  I am supplying a 10-day supply of medications other than the controlled substances and have put the rest of all of his prescriptions into [facility] with a month and another refill.  Patient will need absolute neutrophil counts checked every 2 weeks.  If they continue to be normal he should be eligible to switch to monthly checks in May of this year  Physical Findings: AIMS: Facial and Oral Movements Muscles of Facial Expression: None, normal Lips and Perioral Area: None, normal Jaw: None, normal Tongue: None, normal,Extremity Movements Upper (arms, wrists, hands, fingers): None, normal Lower (legs, knees, ankles, toes): None, normal, Trunk Movements Neck, shoulders, hips: None, normal, Overall Severity Severity of abnormal movements (highest score from questions above): None, normal Incapacitation due to abnormal movements: None, normal Patient's awareness of abnormal movements (rate only  patient's report): No Awareness, Dental Status Current problems with teeth and/or dentures?: No Does patient usually wear dentures?: No  CIWA:    COWS:     Musculoskeletal: Strength & Muscle Tone: within normal limits Gait & Station: normal Patient leans: N/A   Psychiatric Specialty Exam:  Presentation  General Appearance:  Appropriate for Environment; Casual; Neat; Well Groomed  Eye Contact: Fair  Speech: Slow  Speech Volume: Decreased  Handedness: Left   Mood and Affect  Mood: Anxious  Affect: Congruent   Thought Process  Thought Processes: Goal Directed  Descriptions of Associations:Circumstantial  Orientation:Partial  Thought Content:Scattered  History of Schizophrenia/Schizoaffective disorder:Yes  Duration of Psychotic Symptoms:Greater than six months  Hallucinations:No data recorded Ideas of Reference:None  Suicidal Thoughts:No data recorded Homicidal Thoughts:No data recorded  Sensorium  Memory: Remote Good  Judgment: Fair  Insight: Fair   Community education officer  Concentration: Fair  Attention Span: Fair  Recall: [employer] of Knowledge: Fair  Language: Fair   Psychomotor Activity  Psychomotor Activity:No  data recorded  Assets  Assets:No data recorded  Sleep  Sleep:No data recorded   Physical Exam: Physical Exam Vitals and nursing note reviewed.  Constitutional:      Appearance: Normal appearance.  HENT:     Head: Normocephalic and atraumatic.     Mouth/Throat:     Pharynx: Oropharynx is clear.  Eyes:     Pupils: Pupils are equal, round, and reactive to light.  Cardiovascular:     Rate and Rhythm: Normal rate and regular rhythm.  Pulmonary:     Effort: Pulmonary effort is normal.     Breath sounds: Normal breath sounds.  Abdominal:     General: Abdomen is flat.     Palpations: Abdomen is soft.  Musculoskeletal:        General: Normal range of motion.  Skin:    General: Skin is warm and dry.   Neurological:     General: No focal deficit present.     Mental Status: He is alert. Mental status is at baseline.  Psychiatric:        Attention and Perception: He is inattentive.        Mood and Affect: Mood normal. Affect is blunt.        Speech: He is noncommunicative.    Review of Systems  Unable to perform ROS: Language   Blood pressure (!) 135/92, pulse 89, temperature 97.9 F (36.6 C), temperature source Oral, resp. rate 18, height 5\' 8"  (1.727 m), weight 85.8 kg, SpO2 100 %. Body mass index is 28.76 kg/m.   Social History   Tobacco Use  Smoking Status Never   Passive exposure: Never  Smokeless Tobacco Never   Tobacco Cessation:  N/A, patient does not currently use tobacco products   Blood Alcohol level:  Lab Results  Component Value Date   ETH <10 [id number]    Metabolic Disorder Labs:  Lab Results  Component Value Date   HGBA1C 5.5 04/10/2022   MPG 111.15 04/10/2022   No results found for: "PROLACTIN" Lab Results  Component Value Date   CHOL 167 11/20/2021   TRIG 107 11/20/2021   HDL 26 (L) 11/20/2021   CHOLHDL 6.4 11/20/2021   VLDL 21 11/20/2021   LDLCALC 120 (H) 11/20/2021    See Psychiatric Specialty Exam and Suicide Risk Assessment completed by Attending Physician prior to discharge.  Discharge destination:  Other:  Has a new group home arranged  Is patient on multiple antipsychotic therapies at discharge:  No   Has Patient had three or more failed trials of antipsychotic monotherapy by history:  Yes,   Antipsychotic medications that previously failed include:   1.  Olanzapine., 2.  Haldol., and 3.  Risperdal.  Recommended Plan for Multiple Antipsychotic Therapies: Second antipsychotic is Clozapine.  Reason for adding Clozapine patient is on clozapine and is also still on Invega and Haldol.  Tolerating medicine well.  No evidence of any side effects or problems.  Strongly recommend that decisions to change only be made by a follow-up physician  after considering the whole situation.  Discharge Instructions     Diet - low sodium heart healthy   Complete by: As directed    Increase activity slowly   Complete by: As directed       Allergies as of 02/19/2023       Reactions   Morphine And Related    Nsaids         Medication List     STOP taking these medications  acetaminophen 325 MG tablet Commonly known as: TYLENOL   alum & mag hydroxide-simeth 200-200-20 MG/5ML suspension Commonly known as: MAALOX/MYLANTA   aspirin 81 MG chewable tablet Replaced by: aspirin EC 81 MG tablet   Invega Sustenna 234 MG/1.5ML injection Generic drug: paliperidone Replaced by: paliperidone 156 MG/ML Susy injection   LORazepam 1 MG tablet Commonly known as: ATIVAN   multivitamin tablet   sodium chloride 0.65 % Soln nasal spray Commonly known as: OCEAN       TAKE these medications      Indication  aspirin EC 81 MG tablet Take 1 tablet (81 mg total) by mouth daily. Swallow whole. Start taking on: February 20, 2023 Replaces: aspirin 81 MG chewable tablet  Indication: Stable Angina Pectoris   atorvastatin 20 MG tablet Commonly known as: LIPITOR Take 1 tablet (20 mg total) by mouth daily.  Indication: High Amount of Fats in the Blood   atropine 1 % ophthalmic solution Place 1 drop under the tongue 4 (four) times daily.  Indication: Sialorrhea from Clozaril   benztropine 0.5 MG tablet Commonly known as: COGENTIN Take 1 tablet (0.5 mg total) by mouth 2 (two) times daily.  Indication: Extrapyramidal Reaction caused by Medications   clonazePAM 1 MG tablet Commonly known as: KLONOPIN Take 1 tablet (1 mg total) by mouth 3 (three) times daily as needed (agitation or anxiety). What changed:  when to take this reasons to take this  Indication: Feeling Anxious   cloZAPine 100 MG tablet Commonly known as: CLOZARIL Take 3 tablets (300 mg total) by mouth at bedtime.  Indication: Schizophrenia that does Not Respond to  Usual Drug Therapy   gabapentin 300 MG capsule Commonly known as: NEURONTIN Take 1 capsule (300 mg total) by mouth 3 (three) times daily.  Indication: Generalized Anxiety Disorder   haloperidol 2 MG tablet Commonly known as: HALDOL Take 1 tablet (2 mg total) by mouth 3 (three) times daily. What changed:  medication strength how much to take Another medication with the same name was removed. Continue taking this medication, and follow the directions you see here.  Indication: Psychosis   lisinopril 5 MG tablet Commonly known as: ZESTRIL Take 1 tablet (5 mg total) by mouth daily. Start taking on: February 20, 2023  Indication: High Blood Pressure Disorder   metoprolol succinate 25 MG 24 hr tablet Commonly known as: TOPROL-XL Take 1 tablet (25 mg total) by mouth daily. Take with or immediately following a meal. Start taking on: February 20, 2023 What changed: additional instructions  Indication: High Blood Pressure Disorder   paliperidone 156 MG/ML Susy injection Commonly known as: INVEGA SUSTENNA Inject 1 mL (156 mg total) into the muscle every 28 (twenty-eight) days. Next dose due March 12, 2023 Start taking on: March 12, 2023 Replaces: Lorayne Bender Sustenna 234 MG/1.5ML injection  Indication: Schizophrenia   paliperidone 6 MG 24 hr tablet Commonly known as: INVEGA Take 1 tablet (6 mg total) by mouth at bedtime.  Indication: Schizophrenia   senna-docusate 8.6-50 MG tablet Commonly known as: Senokot-S Take 2 tablets by mouth at bedtime as needed for mild constipation.  Indication: Constipation   temazepam 15 MG capsule Commonly known as: RESTORIL Take 1 capsule (15 mg total) by mouth at bedtime.  Indication: Trouble Sleeping        Follow-up Information     [facility] Follow up.   Why: Home has a primary care physician and psychiatrist scheduled to come to the home every 5 weeks, they will assist with  setting up care.                Follow-up  recommendations:  Other:  Follow-up with provider in [place].  Prescriptions and brief supply of medicine provided. patient's mother by the way is also aware of the plan and in agreement  Comments: See above  Signed: Alethia Berthold, MD 02/19/2023, 9:34 AM

## 2023-02-19 NOTE — Progress Notes (Signed)
Patient ID: Adam [name], male   DOB: [birth year], 62 y.o.   MRN: [id number]  Discharge Note:  Patient denies SI/HI/AVH at this time. Discharge instructions, AVS, prescriptions, medication supply and transition record gone over with patient. Patient could not complete the Suicide Safety Plan. Patient agrees to comply with medication management, follow-up visit, and outpatient therapy. Patient belongings returned to patient. Patient questions and concerns addressed and answered. Patient ambulatory off unit. Patient discharged to [facility] via [employer] [facility].

## 2023-02-19 NOTE — Progress Notes (Signed)
Patient has been pleasant interacting well with Staff and Peers. Compliant with medications denies SI/HI/A.VH and verbally contracted for safety. Support and encouragement ongoing. Patient remains safe.

## 2023-02-19 NOTE — Progress Notes (Signed)
  [facility] Adult Case Management Discharge Plan :  Will you be returning to the same living situation after discharge:  No. Patient is going to a new group home, [facility]. At discharge, do you have transportation home?: Yes,  CSW assisted in scheduling Safe Transport Do you have the ability to pay for your medications: Yes,  [employer]ICARE / [place]  Release of information consent forms completed and in the chart;  Patient's signature needed at discharge.  Patient to Follow up at:  Follow-up Information     [facility] Follow up.   Why: Home has a primary care physician and psychiatrist scheduled to come to the home every 5 weeks, they will assist with setting up care.                Next level of care provider has access to [place] and Suicide Prevention discussed: Yes,  SPE completed with the patient and patient's mother.      Has patient been referred to the Quitline?: N/A patient is not a smoker  Patient has been referred for addiction treatment: [place], LCSW 02/19/2023, 2:27 PM

## 2023-03-12 ENCOUNTER — Other Ambulatory Visit: Payer: Self-pay

## 2023-04-25 ENCOUNTER — Other Ambulatory Visit: Payer: Self-pay
